# Patient Record
Sex: Female | Born: 1940
Health system: Southern US, Community
[De-identification: ages and names within clinical notes are randomized; demographics above are authoritative.]

## PROBLEM LIST (undated history)

## (undated) ENCOUNTER — Emergency Department (HOSPITAL_COMMUNITY): Admission: EM | Discharge status: Against Medical Advice | Payer: Medicare Other

## (undated) ENCOUNTER — Emergency Department (HOSPITAL_COMMUNITY)
Discharge status: Against Medical Advice | Payer: Medicare Other | Attending: Emergency Medicine | Admitting: Emergency Medicine

## (undated) DIAGNOSIS — Z95810 Presence of automatic (implantable) cardiac defibrillator: Secondary | ICD-10-CM

## (undated) DIAGNOSIS — M069 Rheumatoid arthritis, unspecified: Secondary | ICD-10-CM

## (undated) DIAGNOSIS — M109 Gout, unspecified: Secondary | ICD-10-CM

## (undated) DIAGNOSIS — D649 Anemia, unspecified: Secondary | ICD-10-CM

## (undated) DIAGNOSIS — I10 Essential (primary) hypertension: Secondary | ICD-10-CM

## (undated) DIAGNOSIS — R700 Elevated erythrocyte sedimentation rate: Secondary | ICD-10-CM

## (undated) DIAGNOSIS — N390 Urinary tract infection, site not specified: Secondary | ICD-10-CM

## (undated) DIAGNOSIS — H9190 Unspecified hearing loss, unspecified ear: Secondary | ICD-10-CM

## (undated) DIAGNOSIS — K219 Gastro-esophageal reflux disease without esophagitis: Secondary | ICD-10-CM

## (undated) DIAGNOSIS — J42 Unspecified chronic bronchitis: Secondary | ICD-10-CM

## (undated) DIAGNOSIS — I2510 Atherosclerotic heart disease of native coronary artery without angina pectoris: Secondary | ICD-10-CM

## (undated) DIAGNOSIS — N183 Chronic kidney disease, stage 3 (moderate): Principal | ICD-10-CM

## (undated) DIAGNOSIS — C50919 Malignant neoplasm of unspecified site of unspecified female breast: Secondary | ICD-10-CM

## (undated) DIAGNOSIS — K922 Gastrointestinal hemorrhage, unspecified: Secondary | ICD-10-CM

## (undated) DIAGNOSIS — I509 Heart failure, unspecified: Secondary | ICD-10-CM

## (undated) DIAGNOSIS — E7800 Pure hypercholesterolemia, unspecified: Secondary | ICD-10-CM

## (undated) DIAGNOSIS — D638 Anemia in other chronic diseases classified elsewhere: Secondary | ICD-10-CM

## (undated) DIAGNOSIS — E785 Hyperlipidemia, unspecified: Secondary | ICD-10-CM

## (undated) HISTORY — PX: CATARACT EXTRACTION W/ INTRAOCULAR LENS IMPLANT: SHX1309

## (undated) HISTORY — DX: Malignant neoplasm of unspecified site of unspecified female breast: C50.919

## (undated) HISTORY — DX: Essential (primary) hypertension: I10

## (undated) HISTORY — DX: Anemia in other chronic diseases classified elsewhere: D63.8

## (undated) HISTORY — DX: Hyperlipidemia, unspecified: E78.5

## (undated) HISTORY — DX: Gastrointestinal hemorrhage, unspecified: K92.2

## (undated) HISTORY — DX: Heart failure, unspecified: I50.9

## (undated) HISTORY — DX: Chronic kidney disease, stage 3 (moderate): N18.3

## (undated) HISTORY — DX: Unspecified chronic bronchitis: J42

## (undated) HISTORY — DX: Gout, unspecified: M10.9

## (undated) HISTORY — DX: Anemia, unspecified: D64.9

## (undated) HISTORY — DX: Pure hypercholesterolemia, unspecified: E78.00

## (undated) HISTORY — PX: TUBAL LIGATION: SHX77

## (undated) HISTORY — DX: Elevated erythrocyte sedimentation rate: R70.0

## (undated) HISTORY — PX: BREAST BIOPSY: SHX20

## (undated) HISTORY — DX: Rheumatoid arthritis, unspecified: M06.9

## (undated) HISTORY — DX: Gastro-esophageal reflux disease without esophagitis: K21.9

## (undated) HISTORY — PX: TOTAL KNEE ARTHROPLASTY: SHX125

## (undated) HISTORY — DX: Atherosclerotic heart disease of native coronary artery without angina pectoris: I25.10

---

## 1996-04-23 HISTORY — PX: MASTECTOMY: SHX3

## 2000-09-03 ENCOUNTER — Emergency Department (HOSPITAL_COMMUNITY): Admission: EM | Admit: 2000-09-03 | Discharge: 2000-09-03 | Payer: Self-pay | Admitting: Emergency Medicine

## 2000-09-03 ENCOUNTER — Encounter: Payer: Self-pay | Admitting: Emergency Medicine

## 2000-09-06 ENCOUNTER — Encounter: Payer: Self-pay | Admitting: Cardiology

## 2000-09-06 ENCOUNTER — Ambulatory Visit (HOSPITAL_COMMUNITY): Admission: RE | Admit: 2000-09-06 | Discharge: 2000-09-09 | Payer: Self-pay | Admitting: Cardiology

## 2000-12-19 ENCOUNTER — Ambulatory Visit (HOSPITAL_COMMUNITY): Admission: RE | Admit: 2000-12-19 | Discharge: 2000-12-19 | Payer: Self-pay | Admitting: Family Medicine

## 2000-12-19 ENCOUNTER — Encounter: Payer: Self-pay | Admitting: Family Medicine

## 2000-12-20 ENCOUNTER — Ambulatory Visit (HOSPITAL_COMMUNITY): Admission: RE | Admit: 2000-12-20 | Discharge: 2000-12-20 | Payer: Self-pay | Admitting: Family Medicine

## 2000-12-20 ENCOUNTER — Encounter: Payer: Self-pay | Admitting: Family Medicine

## 2001-04-01 ENCOUNTER — Other Ambulatory Visit: Admission: RE | Admit: 2001-04-01 | Discharge: 2001-04-01 | Payer: Self-pay | Admitting: Family Medicine

## 2001-04-25 ENCOUNTER — Encounter: Payer: Self-pay | Admitting: Internal Medicine

## 2001-04-25 ENCOUNTER — Emergency Department (HOSPITAL_COMMUNITY): Admission: EM | Admit: 2001-04-25 | Discharge: 2001-04-25 | Payer: Self-pay | Admitting: Internal Medicine

## 2001-05-20 ENCOUNTER — Encounter (HOSPITAL_COMMUNITY): Admission: RE | Admit: 2001-05-20 | Discharge: 2001-06-19 | Payer: Self-pay | Admitting: Oncology

## 2001-05-20 ENCOUNTER — Encounter: Admission: RE | Admit: 2001-05-20 | Discharge: 2001-05-20 | Payer: Self-pay | Admitting: Oncology

## 2001-12-30 ENCOUNTER — Encounter: Payer: Self-pay | Admitting: Family Medicine

## 2001-12-30 ENCOUNTER — Ambulatory Visit (HOSPITAL_COMMUNITY): Admission: RE | Admit: 2001-12-30 | Discharge: 2001-12-30 | Payer: Self-pay | Admitting: Family Medicine

## 2002-05-21 ENCOUNTER — Encounter: Admission: RE | Admit: 2002-05-21 | Discharge: 2002-05-21 | Payer: Self-pay | Admitting: Oncology

## 2002-05-21 ENCOUNTER — Encounter (HOSPITAL_COMMUNITY): Admission: RE | Admit: 2002-05-21 | Discharge: 2002-06-20 | Payer: Self-pay | Admitting: Oncology

## 2002-10-08 ENCOUNTER — Encounter: Payer: Self-pay | Admitting: Internal Medicine

## 2002-10-08 ENCOUNTER — Ambulatory Visit (HOSPITAL_COMMUNITY): Admission: RE | Admit: 2002-10-08 | Discharge: 2002-10-08 | Payer: Self-pay | Admitting: Internal Medicine

## 2003-01-05 ENCOUNTER — Ambulatory Visit (HOSPITAL_COMMUNITY): Admission: RE | Admit: 2003-01-05 | Discharge: 2003-01-05 | Payer: Self-pay | Admitting: Internal Medicine

## 2003-01-05 ENCOUNTER — Encounter: Payer: Self-pay | Admitting: Internal Medicine

## 2003-05-21 ENCOUNTER — Encounter: Admission: RE | Admit: 2003-05-21 | Discharge: 2003-05-21 | Payer: Self-pay | Admitting: Oncology

## 2003-05-21 ENCOUNTER — Encounter (HOSPITAL_COMMUNITY): Admission: RE | Admit: 2003-05-21 | Discharge: 2003-06-20 | Payer: Self-pay | Admitting: Oncology

## 2003-12-02 ENCOUNTER — Encounter: Payer: Self-pay | Admitting: Orthopedic Surgery

## 2004-01-06 ENCOUNTER — Ambulatory Visit (HOSPITAL_COMMUNITY): Admission: RE | Admit: 2004-01-06 | Discharge: 2004-01-06 | Payer: Self-pay | Admitting: Internal Medicine

## 2004-02-10 ENCOUNTER — Ambulatory Visit (HOSPITAL_COMMUNITY): Admission: RE | Admit: 2004-02-10 | Discharge: 2004-02-10 | Payer: Self-pay

## 2004-05-19 ENCOUNTER — Encounter: Admission: RE | Admit: 2004-05-19 | Discharge: 2004-05-19 | Payer: Self-pay | Admitting: Oncology

## 2004-05-19 ENCOUNTER — Encounter (HOSPITAL_COMMUNITY): Admission: RE | Admit: 2004-05-19 | Discharge: 2004-06-18 | Payer: Self-pay | Admitting: Oncology

## 2004-05-19 ENCOUNTER — Ambulatory Visit (HOSPITAL_COMMUNITY): Payer: Self-pay | Admitting: Oncology

## 2004-07-05 ENCOUNTER — Ambulatory Visit (HOSPITAL_COMMUNITY): Payer: Self-pay | Admitting: Oncology

## 2004-07-05 ENCOUNTER — Encounter (HOSPITAL_COMMUNITY): Admission: RE | Admit: 2004-07-05 | Discharge: 2004-08-04 | Payer: Self-pay | Admitting: Oncology

## 2004-07-05 ENCOUNTER — Encounter: Admission: RE | Admit: 2004-07-05 | Discharge: 2004-07-05 | Payer: Self-pay | Admitting: Oncology

## 2004-08-08 ENCOUNTER — Ambulatory Visit: Payer: Self-pay | Admitting: Orthopedic Surgery

## 2004-08-08 ENCOUNTER — Inpatient Hospital Stay (HOSPITAL_COMMUNITY): Admission: RE | Admit: 2004-08-08 | Discharge: 2004-08-12 | Payer: Self-pay | Admitting: Orthopedic Surgery

## 2004-08-17 ENCOUNTER — Ambulatory Visit: Payer: Self-pay | Admitting: Orthopedic Surgery

## 2004-08-21 ENCOUNTER — Ambulatory Visit: Payer: Self-pay | Admitting: Orthopedic Surgery

## 2004-09-04 ENCOUNTER — Ambulatory Visit: Payer: Self-pay | Admitting: Orthopedic Surgery

## 2004-10-30 ENCOUNTER — Ambulatory Visit: Payer: Self-pay | Admitting: Orthopedic Surgery

## 2005-01-08 ENCOUNTER — Encounter (HOSPITAL_COMMUNITY): Admission: RE | Admit: 2005-01-08 | Discharge: 2005-01-20 | Payer: Self-pay | Admitting: Oncology

## 2005-01-08 ENCOUNTER — Encounter: Admission: RE | Admit: 2005-01-08 | Discharge: 2005-01-20 | Payer: Self-pay | Admitting: Oncology

## 2005-02-05 ENCOUNTER — Ambulatory Visit: Payer: Self-pay | Admitting: Orthopedic Surgery

## 2005-05-14 ENCOUNTER — Ambulatory Visit (HOSPITAL_COMMUNITY): Admission: RE | Admit: 2005-05-14 | Discharge: 2005-05-14 | Payer: Self-pay | Admitting: Internal Medicine

## 2005-05-18 ENCOUNTER — Ambulatory Visit (HOSPITAL_COMMUNITY): Payer: Self-pay | Admitting: Oncology

## 2005-05-18 ENCOUNTER — Encounter (HOSPITAL_COMMUNITY): Admission: RE | Admit: 2005-05-18 | Discharge: 2005-06-17 | Payer: Self-pay | Admitting: Oncology

## 2005-05-18 ENCOUNTER — Encounter: Admission: RE | Admit: 2005-05-18 | Discharge: 2005-05-18 | Payer: Self-pay | Admitting: Oncology

## 2005-08-01 ENCOUNTER — Ambulatory Visit: Payer: Self-pay | Admitting: Orthopedic Surgery

## 2006-01-04 ENCOUNTER — Ambulatory Visit (HOSPITAL_COMMUNITY): Admission: RE | Admit: 2006-01-04 | Discharge: 2006-01-04 | Payer: Self-pay | Admitting: Internal Medicine

## 2006-01-09 ENCOUNTER — Ambulatory Visit (HOSPITAL_COMMUNITY): Admission: RE | Admit: 2006-01-09 | Discharge: 2006-01-09 | Payer: Self-pay | Admitting: Internal Medicine

## 2006-05-17 ENCOUNTER — Encounter (HOSPITAL_COMMUNITY): Admission: RE | Admit: 2006-05-17 | Discharge: 2006-06-16 | Payer: Self-pay | Admitting: Oncology

## 2006-05-17 ENCOUNTER — Ambulatory Visit (HOSPITAL_COMMUNITY): Payer: Self-pay | Admitting: Oncology

## 2006-08-12 ENCOUNTER — Ambulatory Visit: Payer: Self-pay | Admitting: Orthopedic Surgery

## 2007-01-30 ENCOUNTER — Ambulatory Visit (HOSPITAL_COMMUNITY): Admission: RE | Admit: 2007-01-30 | Discharge: 2007-01-30 | Payer: Self-pay | Admitting: Internal Medicine

## 2007-05-16 ENCOUNTER — Ambulatory Visit (HOSPITAL_COMMUNITY): Payer: Self-pay | Admitting: Oncology

## 2007-07-11 ENCOUNTER — Inpatient Hospital Stay (HOSPITAL_COMMUNITY): Admission: EM | Admit: 2007-07-11 | Discharge: 2007-07-17 | Payer: Self-pay | Admitting: Emergency Medicine

## 2007-07-11 ENCOUNTER — Ambulatory Visit: Payer: Self-pay | Admitting: Cardiology

## 2007-07-14 ENCOUNTER — Encounter (INDEPENDENT_AMBULATORY_CARE_PROVIDER_SITE_OTHER): Payer: Self-pay | Admitting: Internal Medicine

## 2007-08-11 ENCOUNTER — Ambulatory Visit: Payer: Self-pay | Admitting: Orthopedic Surgery

## 2007-08-11 DIAGNOSIS — M19041 Primary osteoarthritis, right hand: Secondary | ICD-10-CM | POA: Insufficient documentation

## 2007-08-11 DIAGNOSIS — Z96659 Presence of unspecified artificial knee joint: Secondary | ICD-10-CM | POA: Insufficient documentation

## 2007-08-11 DIAGNOSIS — M19042 Primary osteoarthritis, left hand: Secondary | ICD-10-CM | POA: Insufficient documentation

## 2007-08-15 ENCOUNTER — Ambulatory Visit: Payer: Self-pay | Admitting: Internal Medicine

## 2007-10-16 ENCOUNTER — Ambulatory Visit: Payer: Self-pay | Admitting: Internal Medicine

## 2008-01-19 ENCOUNTER — Ambulatory Visit (HOSPITAL_COMMUNITY): Admission: RE | Admit: 2008-01-19 | Discharge: 2008-01-19 | Payer: Self-pay | Admitting: Internal Medicine

## 2008-01-19 ENCOUNTER — Encounter: Payer: Self-pay | Admitting: Internal Medicine

## 2008-01-19 ENCOUNTER — Ambulatory Visit: Payer: Self-pay | Admitting: Cardiology

## 2008-01-21 ENCOUNTER — Ambulatory Visit: Payer: Self-pay | Admitting: Cardiology

## 2008-02-03 ENCOUNTER — Ambulatory Visit (HOSPITAL_COMMUNITY): Admission: RE | Admit: 2008-02-03 | Discharge: 2008-02-03 | Payer: Self-pay | Admitting: Internal Medicine

## 2008-04-08 ENCOUNTER — Ambulatory Visit: Payer: Self-pay | Admitting: Cardiology

## 2008-04-12 ENCOUNTER — Ambulatory Visit (HOSPITAL_COMMUNITY): Admission: RE | Admit: 2008-04-12 | Discharge: 2008-04-12 | Payer: Self-pay | Admitting: Cardiology

## 2008-04-14 ENCOUNTER — Ambulatory Visit: Payer: Self-pay | Admitting: Cardiology

## 2008-05-05 ENCOUNTER — Ambulatory Visit: Payer: Self-pay | Admitting: Cardiology

## 2008-05-24 ENCOUNTER — Ambulatory Visit (HOSPITAL_COMMUNITY): Payer: Self-pay | Admitting: Oncology

## 2008-05-24 ENCOUNTER — Encounter (HOSPITAL_COMMUNITY): Admission: RE | Admit: 2008-05-24 | Discharge: 2008-06-23 | Payer: Self-pay | Admitting: Oncology

## 2008-05-27 ENCOUNTER — Ambulatory Visit: Payer: Self-pay | Admitting: Cardiology

## 2008-09-27 ENCOUNTER — Ambulatory Visit: Payer: Self-pay | Admitting: Infectious Diseases

## 2008-09-27 ENCOUNTER — Observation Stay (HOSPITAL_COMMUNITY): Admission: EM | Admit: 2008-09-27 | Discharge: 2008-09-28 | Payer: Self-pay | Admitting: Emergency Medicine

## 2008-09-28 ENCOUNTER — Encounter: Payer: Self-pay | Admitting: Infectious Diseases

## 2009-01-18 ENCOUNTER — Ambulatory Visit (HOSPITAL_COMMUNITY): Admission: RE | Admit: 2009-01-18 | Discharge: 2009-01-18 | Payer: Self-pay | Admitting: Cardiology

## 2009-01-18 ENCOUNTER — Encounter (INDEPENDENT_AMBULATORY_CARE_PROVIDER_SITE_OTHER): Payer: Self-pay | Admitting: *Deleted

## 2009-01-18 ENCOUNTER — Encounter: Payer: Self-pay | Admitting: Cardiology

## 2009-01-18 ENCOUNTER — Ambulatory Visit: Payer: Self-pay | Admitting: Cardiology

## 2009-01-20 ENCOUNTER — Encounter (INDEPENDENT_AMBULATORY_CARE_PROVIDER_SITE_OTHER): Payer: Self-pay | Admitting: *Deleted

## 2009-01-21 ENCOUNTER — Encounter: Payer: Self-pay | Admitting: Cardiology

## 2009-02-14 ENCOUNTER — Ambulatory Visit (HOSPITAL_COMMUNITY): Admission: RE | Admit: 2009-02-14 | Discharge: 2009-02-14 | Payer: Self-pay | Admitting: Internal Medicine

## 2009-02-28 ENCOUNTER — Encounter: Payer: Self-pay | Admitting: Cardiology

## 2009-02-28 ENCOUNTER — Encounter (INDEPENDENT_AMBULATORY_CARE_PROVIDER_SITE_OTHER): Payer: Self-pay | Admitting: *Deleted

## 2009-02-28 LAB — CONVERTED CEMR LAB
ALT: 11 units/L
ALT: 11 units/L (ref 0–35)
AST: 15 units/L
AST: 15 units/L (ref 0–37)
Albumin: 3.8 g/dL (ref 3.5–5.2)
Alkaline Phosphatase: 68 units/L
Alkaline Phosphatase: 68 units/L (ref 39–117)
Bilirubin, Direct: 0.1 mg/dL
Bilirubin, Direct: 0.1 mg/dL (ref 0.0–0.3)
Cholesterol: 140 mg/dL (ref 0–200)
HDL: 37 mg/dL — ABNORMAL LOW (ref >39)
Indirect Bilirubin: 0.2 mg/dL (ref 0.0–0.9)
LDL Cholesterol: 86 mg/dL
LDL Cholesterol: 86 mg/dL (ref 0–99)
Total Bilirubin: 0.3 mg/dL (ref 0.3–1.2)
Total CHOL/HDL Ratio: 3.8
Total Protein: 7.6 g/dL (ref 6.0–8.3)
Triglycerides: 85 mg/dL (ref <150)
VLDL: 17 mg/dL (ref 0–40)

## 2009-03-07 ENCOUNTER — Encounter: Payer: Self-pay | Admitting: Cardiology

## 2009-05-18 ENCOUNTER — Encounter (INDEPENDENT_AMBULATORY_CARE_PROVIDER_SITE_OTHER): Payer: Self-pay | Admitting: *Deleted

## 2009-05-23 ENCOUNTER — Encounter: Payer: Self-pay | Admitting: Cardiology

## 2009-05-23 ENCOUNTER — Ambulatory Visit (HOSPITAL_COMMUNITY): Payer: Self-pay | Admitting: Oncology

## 2009-05-24 ENCOUNTER — Ambulatory Visit: Payer: Self-pay | Admitting: Cardiology

## 2009-05-24 DIAGNOSIS — I5022 Chronic systolic (congestive) heart failure: Secondary | ICD-10-CM | POA: Insufficient documentation

## 2009-05-24 DIAGNOSIS — E785 Hyperlipidemia, unspecified: Secondary | ICD-10-CM | POA: Insufficient documentation

## 2009-08-12 ENCOUNTER — Emergency Department (HOSPITAL_COMMUNITY): Admission: EM | Admit: 2009-08-12 | Discharge: 2009-08-12 | Payer: Self-pay | Admitting: Emergency Medicine

## 2009-08-15 ENCOUNTER — Emergency Department (HOSPITAL_COMMUNITY): Admission: EM | Admit: 2009-08-15 | Discharge: 2009-08-15 | Payer: Self-pay | Admitting: Emergency Medicine

## 2009-08-16 ENCOUNTER — Emergency Department (HOSPITAL_COMMUNITY): Admission: EM | Admit: 2009-08-16 | Discharge: 2009-08-16 | Payer: Self-pay | Admitting: Emergency Medicine

## 2009-09-07 ENCOUNTER — Ambulatory Visit: Payer: Self-pay | Admitting: Orthopedic Surgery

## 2009-11-21 ENCOUNTER — Encounter (INDEPENDENT_AMBULATORY_CARE_PROVIDER_SITE_OTHER): Payer: Self-pay | Admitting: *Deleted

## 2009-11-21 LAB — CONVERTED CEMR LAB
Albumin: 3.6 g/dL
Alkaline Phosphatase: 78 units/L
Bilirubin, Direct: 0.2 mg/dL
HDL: 39 mg/dL
Triglycerides: 64 mg/dL

## 2009-11-22 ENCOUNTER — Encounter: Payer: Self-pay | Admitting: Cardiology

## 2009-11-22 ENCOUNTER — Encounter (INDEPENDENT_AMBULATORY_CARE_PROVIDER_SITE_OTHER): Payer: Self-pay | Admitting: *Deleted

## 2009-11-22 LAB — CONVERTED CEMR LAB
Bilirubin, Direct: 0.2 mg/dL (ref 0.0–0.3)
Indirect Bilirubin: 0.1 mg/dL (ref 0.0–0.9)
LDL Cholesterol: 67 mg/dL (ref 0–99)
Total Protein: 8 g/dL (ref 6.0–8.3)
VLDL: 13 mg/dL (ref 0–40)

## 2009-12-14 ENCOUNTER — Ambulatory Visit: Payer: Self-pay | Admitting: Cardiology

## 2009-12-14 DIAGNOSIS — R12 Heartburn: Secondary | ICD-10-CM | POA: Insufficient documentation

## 2010-02-16 ENCOUNTER — Ambulatory Visit (HOSPITAL_COMMUNITY): Admission: RE | Admit: 2010-02-16 | Discharge: 2010-02-16 | Payer: Self-pay | Admitting: Internal Medicine

## 2010-03-28 ENCOUNTER — Ambulatory Visit (HOSPITAL_COMMUNITY)
Admission: RE | Admit: 2010-03-28 | Discharge: 2010-03-28 | Payer: Self-pay | Source: Home / Self Care | Admitting: Internal Medicine

## 2010-04-10 ENCOUNTER — Ambulatory Visit (HOSPITAL_COMMUNITY)
Admission: RE | Admit: 2010-04-10 | Discharge: 2010-04-10 | Payer: Self-pay | Source: Home / Self Care | Attending: Internal Medicine | Admitting: Internal Medicine

## 2010-05-14 ENCOUNTER — Encounter: Payer: Self-pay | Admitting: Internal Medicine

## 2010-05-14 ENCOUNTER — Encounter (INDEPENDENT_AMBULATORY_CARE_PROVIDER_SITE_OTHER): Payer: Self-pay | Admitting: Internal Medicine

## 2010-05-19 ENCOUNTER — Encounter (HOSPITAL_COMMUNITY)
Admission: RE | Admit: 2010-05-19 | Discharge: 2010-05-23 | Payer: Self-pay | Source: Home / Self Care | Attending: Oncology | Admitting: Oncology

## 2010-05-23 ENCOUNTER — Encounter: Payer: Self-pay | Admitting: Cardiology

## 2010-05-23 ENCOUNTER — Ambulatory Visit (HOSPITAL_COMMUNITY)
Admission: RE | Admit: 2010-05-23 | Discharge: 2010-05-23 | Payer: Self-pay | Source: Home / Self Care | Attending: Oncology | Admitting: Oncology

## 2010-05-23 LAB — DIFFERENTIAL
Basophils Relative: 1 % (ref 0–1)
Eosinophils Absolute: 0.1 10*3/uL (ref 0.0–0.7)
Neutrophils Relative %: 61 % (ref 43–77)

## 2010-05-23 LAB — COMPREHENSIVE METABOLIC PANEL
ALT: 12 U/L (ref 0–35)
Alkaline Phosphatase: 79 U/L (ref 39–117)
CO2: 29 mEq/L (ref 19–32)
GFR calc non Af Amer: >60 mL/min (ref >60)
Glucose, Bld: 115 mg/dL — ABNORMAL HIGH (ref 70–99)
Potassium: 3.4 mEq/L — ABNORMAL LOW (ref 3.5–5.1)
Sodium: 143 mEq/L (ref 135–145)
Total Bilirubin: 0.4 mg/dL (ref 0.3–1.2)

## 2010-05-23 LAB — CBC
MCH: 26.5 pg (ref 26.0–34.0)
Platelets: 270 10*3/uL (ref 150–400)
RBC: 4.38 MIL/uL (ref 3.87–5.11)
WBC: 6.8 10*3/uL (ref 4.0–10.5)

## 2010-05-23 NOTE — Letter (Signed)
Summary: [facility], general practice at [facility]. [address], [place] [address]   Phone: [phone]  Fax: [phone]      November 22, 2009 MRN: [id number]   [name] [place], [place]  [address]   Dear Ms. Profit,  Your test ordered by Rande Lawman has been reviewed by your physician (or physician assistant) and was found to be normal or stable. Your physician (or physician assistant) felt no changes were needed at this time.  ____ Echocardiogram  ____ Cardiac Stress Test  __X__ Lab Work  ____ Peripheral vascular study of arms, legs or neck  ____ CT scan or X-ray  ____ Lung or Breathing test  ____ Other: Please continue on current medical treatment.  Thank you.   Rozann Lesches, MD, F.A.C.C

## 2010-05-23 NOTE — Letter (Signed)
Summary: [facility]   Imported By: Sallee Provencal 06/21/2009 15:37:29  _____________________________________________________________________  External Attachment:    Type:   Image     Comment:   External Document

## 2010-05-23 NOTE — Letter (Signed)
Summary: [facility], general practice at [facility]. [address] [place] [address]   Phone: [phone]  Fax: [phone]     May 24, 2009 MRN: [id number]   GENTRY MLAKAR [place], [place]  [address]      YOUR LAB WORK IS DUE  November 21, 2009 _________________________________________  Please go to [facility], located across the street from [facility] on the second floor.  Hours are Monday - Friday 7am until 7:30pm         Saturday 8am until 12noon    _X_  DO NOT EAT OR DRINK AFTER MIDNIGHT EVENING PRIOR TO LABWORK  __ YOUR LABWORK IS NOT FASTING --YOU MAY EAT PRIOR TO LABWORK

## 2010-05-23 NOTE — Assessment & Plan Note (Signed)
Summary: 2 yr xr rt tka/medicare/medicaid/bsf   Visit Type:  Follow-up Primary Provider:  Dr. [name]  CC:  right knee.  History of Present Illness: 70 years old status post RIGHT total knee arthroplasty 3 years ago as her 3 year followup   Xrays today.  DOS 08-09-06. Right total knee replacement  She has some stiffness in the morning and swelling when the weather changes but no other symptoms  Review of systems as far as the musculoskeletal system normal    Allergies: No Known Drug Allergies  Physical Exam  Additional Exam:  range of motion in the RIGHT knee is 100 pulses good skin is intact sensations normal knee is stable strength is normal.  She ambulates without a cane   Impression & Recommendations:  Problem # 1:  TOTAL KNEE FOLLOW-UP (ICD-V43.65) Assessment Comment Only  x-ray The  alignment was normal, PTF reduced without tilt or subluxation, no evidence of loosening.  IMPRESSION: normal appearance of the implant   Orders: Est. Patient Level III [id number]) Knee x-ray,  3 views [id number])  Patient Instructions: 1)  Please schedule a follow-up appointment in 1 year. 2)  annual TKA film

## 2010-05-23 NOTE — Miscellaneous (Signed)
Summary: labs lipids,liver,11/21/2009  Clinical Lists Changes  Observations: Added new observation of ALBUMIN: 3.6 g/dL (11/21/2009 8:21) Added new observation of PROTEIN, TOT: 8.0 g/dL (11/21/2009 8:21) Added new observation of SGPT (ALT): 9 units/L (11/21/2009 8:21) Added new observation of SGOT (AST): 16 units/L (11/21/2009 8:21) Added new observation of ALK PHOS: 78 units/L (11/21/2009 8:21) Added new observation of BILI DIRECT: 0.2 mg/dL (11/21/2009 8:21) Added new observation of LDL: 67 mg/dL (11/21/2009 8:21) Added new observation of HDL: 39 mg/dL (11/21/2009 8:21) Added new observation of TRIGLYC TOT: 64 mg/dL (11/21/2009 8:21) Added new observation of CHOLESTEROL: 119 mg/dL (11/21/2009 8:21)

## 2010-05-23 NOTE — Assessment & Plan Note (Signed)
Summary: 6 mth f/u per checkout on 05/24/09/tg      Allergies Added: NKDA  Visit Type:  Follow-up Primary Taraoluwa Thakur:  Dr. [name]   History of Present Illness: 70 year old woman presents for followup. I saw her back in February of this year. She reports no progressive shortness of breath or anginal chest pain. Main symptoms are a description of heartburn, some difficulty swallowing foods, as if things get "stuck." She is not on a regular antacid. She is due to see Dr. Legrand Rams for a physical soon.  Followup labs from August of this year show cholesterol 119, triglycerides 64, HDL 39, LDL 67, AST 16, ALT 9. We discussed these. She is tolerating lovastatin.  We talked about her medication compliance, which seems better, although she does forget to take her twice daily medicines "from time to time."   Current Medications (verified): 1)  Alprazolam .25mg  .... Take 1 Tablet By Mouth Two Times A Day  As Needed 2)  Furosemide 40 Mg Tabs (Furosemide) .... Take 1 Tablet By Mouth Two Times A Day 3)  Aspirin 81 Mg Tbec (Aspirin) .... Take One Tablet By Mouth Daily 4)  Lovastatin 20 Mg Tabs (Lovastatin) .... Take 1 Tablet By Mouth Once A Day 5)  Lisinopril 40 Mg Tabs (Lisinopril) .... Take One Tablet By Mouth Daily 6)  Carvedilol 25 Mg Tabs (Carvedilol) .... Take One Tablet By Mouth Twice A Day  Allergies (verified): No Known Drug Allergies  Past History:  Past Medical History: Last updated: 05/23/2009 CAD - reported remote PTCA Ischemic cardiomyopathy, LVEF 40-45% Anemia Hyperlipidemia Breast cancer Arthritis  Social History: Last updated: 05/23/2009 Retired  Married  Tobacco Use - Former.  Alcohol Use - no  Review of Systems       The patient complains of dyspnea on exertion.  The patient denies anorexia, fever, chest pain, syncope, peripheral edema, hemoptysis, melena, and hematochezia.         Otherwise reviewed and negative.  Vital Signs:  Patient profile:   70 year  old female Weight:      160 pounds Pulse rate:   76 / minute BP sitting:   151 / 75  (right arm)  Vitals Entered By: Doretha Sou, CNA (December 14, 2009 9:22 AM)  Physical Exam  Additional Exam:  Overweight woman in no acute distress. HEENT: Conjunctiva and lids normal, oropharynx with poor dentition. Neck: Supple, no elevated JVP. Lungs: Clear to auscultation, nonlabored. Cardiac: Regular rate and rhythm, soft systolic murmur, indistinct PMI, split S2. Abdomen: Soft, nontender. Skin: Warm and dry. Extremities:: No pitting edema.   Nuclear Study  Procedure date:  07/16/2007  Findings:      IMPRESSION:   Adenosine Myoview electrically nondiagnostic for ischemia.  Myoview   scan with evidence of scar in the septal, inferior, and apical   distributions.  No evidence for inducible ischemia.  LVF on gating   calculated at 37%.  Echocardiogram  Procedure date:  01/18/2009  Findings:      Study Conclusions    1. Left ventricle: The cavity size was mildly dilated. Wall      thickness was increased in a pattern of mild LVH. Systolic      function was mildly to moderately reduced. The estimated ejection      fraction was in the range of 40% to 45%. Hypokinesis of the      anteroseptal, septal and inferoseptal myocardium with paradoxic      septal motion. Contractility of these segments appears  substantially reduced.   2. Mitral valve: Mild regurgitation.   3. Left atrium: The atrium was mildly to moderately dilated.   4. Right atrium: The atrium was mildly to moderately dilated.   5. Pericardium, extracardiac: A very small, free-flowing pericardial      effusion was identified.   Impressions:    - Compared to the previous study of 123XX123, LV systolic function     modestly improved; no AS noted on current study; no ASD noted at     present.  Impression & Recommendations:  Problem # 1:  CORONARY ATHEROSCLEROSIS NATIVE CORONARY ARTERY (ICD-414.01)  No active angina.  Continue medical therapy at this point. Plan to see her back over the next 6 months.  Her updated medication list for this problem includes:    Aspirin 81 Mg Tbec (Aspirin) .[name]... Take one tablet by mouth daily    Lisinopril 40 Mg Tabs (Lisinopril) .[name]... Take one tablet by mouth daily    Carvedilol 25 Mg Tabs (Carvedilol) .[name]... Take one tablet by mouth twice a day  Problem # 2:  HYPERLIPIDEMIA (ICD-272.4)  LDL at goal. Plan followup lipid profile and liver function tests prior to her next visit.  Her updated medication list for this problem includes:    Lovastatin 20 Mg Tabs (Lovastatin) .[name]... Take 1 tablet by mouth once a day  Future Orders: T-Lipid Profile [id number]) ... 06/05/2010 T-Hepatic Function [phone]) ... 06/05/2010  Problem # 3:  CHRONIC SYSTOLIC HEART FAILURE (123456)  Symptomatically stable with NYHA class II dyspnea on exertion. Appears euvolemic on examination. LVEF 40-45%.  Her updated medication list for this problem includes:    Furosemide 40 Mg Tabs (Furosemide) .[name]... Take 1 tablet by mouth two times a day    Aspirin 81 Mg Tbec (Aspirin) .[name]... Take one tablet by mouth daily    Lisinopril 40 Mg Tabs (Lisinopril) .[name]... Take one tablet by mouth daily    Carvedilol 25 Mg Tabs (Carvedilol) .[name]... Take one tablet by mouth twice a day  Problem # 4:  HEARTBURN (ICD-787.1)  Patient describing reflux symptoms, and also difficulty swallowing. I asked her to address this with Dr. Legrand Rams. She may respond to proton pump inhibitor, and if symptoms persist, gastroenterology referral could be considered.  Patient Instructions: 1)  Your physician recommends that you schedule a follow-up appointment in: 6 months 2)  Your physician recommends that you return for lab work in: 6 months, just before next office visit. 3)  Your physician recommends that you continue on your current medications as directed. Please refer to the Current Medication list given to you today.

## 2010-05-23 NOTE — Assessment & Plan Note (Signed)
Summary: PAST DUE FOR F/U PER PT PHONE CALL/TG  Medications Added * ALPRAZOLAM .25MG  Take 1 tablet by mouth two times a day  as needed ASPIRIN 81 MG TBEC (ASPIRIN) Take one tablet by mouth daily LOVASTATIN 20 MG TABS (LOVASTATIN) Take 1 tablet by mouth once a day LISINOPRIL 40 MG TABS (LISINOPRIL) Take one tablet by mouth daily CARVEDILOL 25 MG TABS (CARVEDILOL) Take one tablet by mouth twice a day      Allergies Added: NKDA  Visit Type:  Follow-up Primary Provider:  Dr. [name]   History of Present Illness: 70 year old woman presents for followup visit. She indicates no significant angina, and NYHA class II dyspnea on exertion. She has not been taking her regular medical therapy consistently. We discussed this today. I note that she was hospitalized in June of last year with congestive heart failure exacerbation in the setting of acute bronchitis.   She states that she has been trying to watch her diet and lose some weight.  Labs from November 8 revealed ALT 11, AST 15, LDL 86, HDL 37, triglycerides 85, total cholesterol 140.  Last ischemic evaluation was via an adenosine Myoview in March 2009, demonstrating scar in the septal, inferior, and apical distributions, without ischemia.  Clinical Review Panels:  Echocardiogram Echocardiogram Study Conclusions    1. Left ventricle: The cavity size was mildly dilated. Wall      thickness was increased in a pattern of mild LVH. Systolic      function was mildly to moderately reduced. The estimated ejection      fraction was in the range of 40% to 45%. Hypokinesis of the      anteroseptal, septal and inferoseptal myocardium with paradoxic      septal motion. Contractility of these segments appears      substantially reduced.   2. Mitral valve: Mild regurgitation.   3. Left atrium: The atrium was mildly to moderately dilated.   4. Right atrium: The atrium was mildly to moderately dilated.   5. Pericardium, extracardiac: A very  small, free-flowing pericardial      effusion was identified.   Impressions:    - Compared to the previous study of 123XX123, LV systolic function     modestly improved; no AS noted on current study; no ASD noted at     present. (01/18/2009)    Current Medications (verified): 1)  Alprazolam .25mg  .... Take 1 Tablet By Mouth Two Times A Day  As Needed 2)  Neurontin100 Mg .... Hs 3)  Furosemide 40 Mg Tabs (Furosemide) .... Take 1 Tablet By Mouth Two Times A Day 4)  Aspirin 81 Mg Tbec (Aspirin) .... Take One Tablet By Mouth Daily 5)  Lovastatin 20 Mg Tabs (Lovastatin) .... Take 1 Tablet By Mouth Once A Day 6)  Lisinopril 40 Mg Tabs (Lisinopril) .... Take One Tablet By Mouth Daily 7)  Carvedilol 25 Mg Tabs (Carvedilol) .... Take One Tablet By Mouth Twice A Day  Allergies (verified): No Known Drug Allergies  Past History:  Past Medical History: Last updated: 05/23/2009 CAD - reported remote PTCA Ischemic cardiomyopathy, LVEF 40-45% Anemia Hyperlipidemia Breast cancer Arthritis  Social History: Last updated: 05/23/2009 Retired  Married  Tobacco Use - Former.  Alcohol Use - no  Review of Systems  The patient denies anorexia, fever, chest pain, syncope, peripheral edema, prolonged cough, hemoptysis, abdominal pain, melena, and hematochezia.         Otherwise reviewed and negative.  Vital Signs:  Patient profile:  70 year old female Height:      64 inches Weight:      158 pounds BMI:     27.22 O2 Sat:      98 % on Room air Temp:     97.6 degrees F oral Pulse rate:   75 / minute BP sitting:   147 / 70  (right arm)  Vitals Entered ByJeani Hawking Via LPN (February  1, 624THL 1:33 PM)  Nutrition Counseling: Patient's BMI is greater than 25 and therefore counseled on weight management options.  O2 Flow:  Room air  Physical Exam  Additional Exam:  Overweight female in no acute distress. HEENT: Conjunctiva and lids normal, oropharynx with moist mucosa. Neck: Supple, no  elevated jugular venous pressure or carotid bruits. Cardiac: Regular rate and rhythm, split S2, no S3 gallop. Abdomen: Soft, nontender, bowel sounds present. Extremity: Trace ankle edema, nonpitting. Distal pulses one plus. Skin: Warm and dry. Musculoskeletal: No gross deformities. Neuropsychiatric: Alert and oriented x3, affect appropriate.   EKG  Procedure date:  05/24/2009  Findings:      Sinus rhythm versus ectopic atrial rhythm at 71 beats per minute with left bundle branch block pattern (old).  Impression & Recommendations:  Problem # 1:  CORONARY ATHEROSCLEROSIS NATIVE CORONARY ARTERY (ICD-414.01)  Symptomatically stable on medical therapy, despite admitted noncompliance with regular medication use. I discussed with Ms. Poch being more consistent with her medications. She is not describing any progressive angina. Ischemic evaluation within the last 2 years was reassuring. A 6 month followup visit will be planned.  Her updated medication list for this problem includes:    Aspirin 81 Mg Tbec (Aspirin) .[name]... Take one tablet by mouth daily    Lisinopril 40 Mg Tabs (Lisinopril) .[name]... Take one tablet by mouth daily    Carvedilol 25 Mg Tabs (Carvedilol) .[name]... Take one tablet by mouth twice a day  Problem # 2:  HYPERLIPIDEMIA (ICD-272.4)  LDL fairly well controlled. Continue present medications. Followup lipid profile and liver function tests prior to next visit.  Her updated medication list for this problem includes:    Lovastatin 20 Mg Tabs (Lovastatin) .[name]... Take 1 tablet by mouth once a day  Future Orders: T-Lipid Profile [id number]) ... 11/21/2009 T-Hepatic Function [phone]) ... 11/21/2009  Problem # 3:  CHRONIC SYSTOLIC HEART FAILURE (123456)  NYHA class II dyspnea on exertion and no significant lower extremity edema. LVEF most recently documented in the range of 40-45%. Refill provided for [facility].  Her updated medication list for this problem includes:     Furosemide 40 Mg Tabs (Furosemide) .[name]... Take 1 tablet by mouth two times a day    Aspirin 81 Mg Tbec (Aspirin) .[name]... Take one tablet by mouth daily    Lisinopril 40 Mg Tabs (Lisinopril) .[name]... Take one tablet by mouth daily    Carvedilol 25 Mg Tabs (Carvedilol) .[name]... Take one tablet by mouth twice a day  Patient Instructions: 1)  Your physician recommends that you schedule a follow-up appointment in: 6 months 2)  Your physician recommends that you return for lab work in: 6 months, before next office visit. 3)  Your physician recommends that you continue on your current medications as directed. Please refer to the Current Medication list given to you today. Prescriptions: CARVEDILOL 25 MG TABS (CARVEDILOL) Take one tablet by mouth twice a day  #60 x 6   Entered by:   Jeani Hawking Via LPN   Authorized by:   Beckie Salts, MD, [facility]  Signed by:   Jeani Hawking Via LPN on [id number]   Method used:   Electronically to        [facility]. [phone]* (retail)       [address]       [place], [place]  [address]       Ph: [phone] or [phone]       Fax: [phone]   RxID:   [id number]

## 2010-05-23 NOTE — Miscellaneous (Signed)
Summary: labs lipid,liver , 02/28/2009  Clinical Lists Changes  Observations: Added new observation of ALBUMIN: 3.8 g/dL (02/28/2009 8:35) Added new observation of PROTEIN, TOT: 7.6 g/dL (02/28/2009 8:35) Added new observation of SGPT (ALT): 11 units/L (02/28/2009 8:35) Added new observation of SGOT (AST): 15 units/L (02/28/2009 8:35) Added new observation of ALK PHOS: 68 units/L (02/28/2009 8:35) Added new observation of BILI DIRECT: 0.1 mg/dL (02/28/2009 8:35) Added new observation of LDL: 86 mg/dL (02/28/2009 8:35) Added new observation of HDL: 37 mg/dL (02/28/2009 8:35) Added new observation of TRIGLYC TOT: 85 mg/dL (02/28/2009 8:35) Added new observation of CHOLESTEROL: 140 mg/dL (02/28/2009 8:35)

## 2010-05-23 NOTE — Letter (Signed)
Summary: [facility], general practice at [facility]. [address], [place] [address]   Phone: [phone]  Fax: [phone]     December 14, 2009 MRN: [id number]   ALIANAH SCHELLIN [place], [place]  [address]      YOUR LAB WORK IS DUE  June 05, 2010 _________________________________________  Please go to [facility], located across the street from [facility] on the second floor.  Hours are Monday - Friday 7am until 7:30pm         Saturday 8am until 12noon    _X_  DO NOT EAT OR DRINK AFTER MIDNIGHT EVENING PRIOR TO LABWORK  __ YOUR LABWORK IS NOT FASTING --YOU MAY EAT PRIOR TO LABWORK

## 2010-06-06 ENCOUNTER — Encounter: Payer: Self-pay | Admitting: Cardiology

## 2010-06-06 LAB — CONVERTED CEMR LAB
ALT: 10 units/L (ref 0–35)
AST: 18 units/L (ref 0–37)
Bilirubin, Direct: 0.1 mg/dL (ref 0.0–0.3)
Cholesterol: 129 mg/dL (ref 0–200)
HDL: 40 mg/dL (ref >39)
Total CHOL/HDL Ratio: 3.2

## 2010-06-14 NOTE — Letter (Signed)
Summary: [facility], general practice at [facility]. [address] [place] [address]   Phone: [phone]  Fax: [phone]      June 06, 2010 MRN: [id number]   [name] [place], [place]  [address]   Dear [name],  Your test ordered by Rande Lawman has been reviewed by your physician (or physician assistant) and was found to be normal or stable. Your physician (or physician assistant) felt no changes were needed at this time.  ____ Echocardiogram  ____ Cardiac Stress Test  __X__ Lab Work  ____ Peripheral vascular study of arms, legs or neck  ____ CT scan or X-ray  ____ Lung or Breathing test  ____ Other:  Please continue on current medical treatment.  Thank you.   Rozann Lesches, MD, F.A.C.C

## 2010-06-20 NOTE — Letter (Signed)
Summary: [facility] Office Progress Note   [facility] Office Progress Note   Imported By: Sallee Provencal 06/14/2010 12:21:08  _____________________________________________________________________  External Attachment:    Type:   Image     Comment:   External Document

## 2010-07-04 ENCOUNTER — Other Ambulatory Visit (HOSPITAL_COMMUNITY): Payer: Self-pay | Admitting: Internal Medicine

## 2010-07-04 ENCOUNTER — Ambulatory Visit (HOSPITAL_COMMUNITY)
Admission: RE | Admit: 2010-07-04 | Discharge: 2010-07-04 | Disposition: A | Discharge status: Home / Self Care | Payer: MEDICARE | Source: Ambulatory Visit | Attending: Internal Medicine | Admitting: Internal Medicine

## 2010-07-04 DIAGNOSIS — R05 Cough: Secondary | ICD-10-CM

## 2010-07-04 DIAGNOSIS — J90 Pleural effusion, not elsewhere classified: Secondary | ICD-10-CM | POA: Insufficient documentation

## 2010-07-04 DIAGNOSIS — R059 Cough, unspecified: Secondary | ICD-10-CM

## 2010-07-11 LAB — CULTURE, ROUTINE-ABSCESS

## 2010-07-19 ENCOUNTER — Inpatient Hospital Stay (HOSPITAL_COMMUNITY)
Admission: EM | Admit: 2010-07-19 | Discharge: 2010-07-24 | DRG: 193 | Disposition: A | Discharge status: Home / Self Care | Payer: Medicare Other | Attending: Internal Medicine | Admitting: Internal Medicine

## 2010-07-19 ENCOUNTER — Emergency Department (HOSPITAL_COMMUNITY): Payer: Medicare Other

## 2010-07-19 DIAGNOSIS — J189 Pneumonia, unspecified organism: Principal | ICD-10-CM | POA: Diagnosis present

## 2010-07-19 DIAGNOSIS — E785 Hyperlipidemia, unspecified: Secondary | ICD-10-CM | POA: Diagnosis present

## 2010-07-19 DIAGNOSIS — D649 Anemia, unspecified: Secondary | ICD-10-CM | POA: Diagnosis present

## 2010-07-19 DIAGNOSIS — I509 Heart failure, unspecified: Secondary | ICD-10-CM | POA: Diagnosis present

## 2010-07-19 DIAGNOSIS — I5023 Acute on chronic systolic (congestive) heart failure: Secondary | ICD-10-CM | POA: Diagnosis present

## 2010-07-19 DIAGNOSIS — I10 Essential (primary) hypertension: Secondary | ICD-10-CM | POA: Diagnosis present

## 2010-07-19 DIAGNOSIS — I428 Other cardiomyopathies: Secondary | ICD-10-CM | POA: Diagnosis present

## 2010-07-19 DIAGNOSIS — E876 Hypokalemia: Secondary | ICD-10-CM | POA: Diagnosis present

## 2010-07-19 DIAGNOSIS — Z853 Personal history of malignant neoplasm of breast: Secondary | ICD-10-CM

## 2010-07-19 DIAGNOSIS — I2510 Atherosclerotic heart disease of native coronary artery without angina pectoris: Secondary | ICD-10-CM | POA: Diagnosis present

## 2010-07-19 LAB — URINALYSIS, ROUTINE W REFLEX MICROSCOPIC
Bilirubin Urine: NEGATIVE
Ketones, ur: NEGATIVE mg/dL
Leukocytes, UA: NEGATIVE
Nitrite: NEGATIVE
Protein, ur: 30 mg/dL — AB

## 2010-07-19 LAB — BASIC METABOLIC PANEL
Calcium: 9 mg/dL (ref 8.4–10.5)
Creatinine, Ser: 0.85 mg/dL (ref 0.4–1.2)
GFR calc Af Amer: >60 mL/min (ref >60)
GFR calc non Af Amer: >60 mL/min (ref >60)
Sodium: 134 mEq/L — ABNORMAL LOW (ref 135–145)

## 2010-07-19 LAB — CBC
HCT: 33.5 % — ABNORMAL LOW (ref 36.0–46.0)
Hemoglobin: 10.7 g/dL — ABNORMAL LOW (ref 12.0–15.0)
MCH: 25.9 pg — ABNORMAL LOW (ref 26.0–34.0)
MCHC: 31.9 g/dL (ref 30.0–36.0)
MCV: 81.1 fL (ref 78.0–100.0)
Platelets: 241 10*3/uL (ref 150–400)
RBC: 4.13 MIL/uL (ref 3.87–5.11)
RDW: 16.4 % — ABNORMAL HIGH (ref 11.5–15.5)
WBC: 8.5 10*3/uL (ref 4.0–10.5)

## 2010-07-19 LAB — DIFFERENTIAL
Basophils Absolute: 0 10*3/uL (ref 0.0–0.1)
Basophils Relative: 0 % (ref 0–1)
Eosinophils Absolute: 0 10*3/uL (ref 0.0–0.7)
Eosinophils Relative: 0 % (ref 0–5)
Lymphocytes Relative: 7 % — ABNORMAL LOW (ref 12–46)
Lymphs Abs: 0.6 10*3/uL — ABNORMAL LOW (ref 0.7–4.0)
Monocytes Absolute: 0.7 10*3/uL (ref 0.1–1.0)
Monocytes Relative: 9 % (ref 3–12)
Neutro Abs: 7.2 10*3/uL (ref 1.7–7.7)
Neutrophils Relative %: 84 % — ABNORMAL HIGH (ref 43–77)

## 2010-07-19 LAB — POCT CARDIAC MARKERS
CKMB, poc: 1.8 ng/mL (ref 1.0–8.0)
Myoglobin, poc: >500 ng/mL (ref 12–200)
Troponin i, poc: <0.05 ng/mL (ref 0.00–0.09)

## 2010-07-19 LAB — BRAIN NATRIURETIC PEPTIDE: Pro B Natriuretic peptide (BNP): 1850 pg/mL — ABNORMAL HIGH (ref 0.0–100.0)

## 2010-07-19 LAB — URINE MICROSCOPIC-ADD ON

## 2010-07-19 LAB — LACTIC ACID, PLASMA: Lactic Acid, Venous: 1.1 mmol/L (ref 0.5–2.2)

## 2010-07-20 DIAGNOSIS — I517 Cardiomegaly: Secondary | ICD-10-CM

## 2010-07-20 LAB — MRSA PCR SCREENING: MRSA by PCR: NEGATIVE

## 2010-07-21 DIAGNOSIS — I2589 Other forms of chronic ischemic heart disease: Secondary | ICD-10-CM

## 2010-07-21 DIAGNOSIS — I509 Heart failure, unspecified: Secondary | ICD-10-CM

## 2010-07-21 LAB — BASIC METABOLIC PANEL
CO2: 24 mEq/L (ref 19–32)
Chloride: 105 mEq/L (ref 96–112)
Creatinine, Ser: 0.81 mg/dL (ref 0.4–1.2)
GFR calc Af Amer: >60 mL/min (ref >60)
Potassium: 3.4 mEq/L — ABNORMAL LOW (ref 3.5–5.1)

## 2010-07-22 LAB — BASIC METABOLIC PANEL
CO2: 22 mEq/L (ref 19–32)
Calcium: 8.5 mg/dL (ref 8.4–10.5)
Chloride: 106 mEq/L (ref 96–112)
GFR calc Af Amer: >60 mL/min (ref >60)
Glucose, Bld: 89 mg/dL (ref 70–99)
Sodium: 135 mEq/L (ref 135–145)

## 2010-07-23 LAB — BASIC METABOLIC PANEL
CO2: 22 mEq/L (ref 19–32)
Calcium: 8.5 mg/dL (ref 8.4–10.5)
Chloride: 106 mEq/L (ref 96–112)
GFR calc Af Amer: >60 mL/min (ref >60)
Glucose, Bld: 84 mg/dL (ref 70–99)
Potassium: 4.1 mEq/L (ref 3.5–5.1)
Sodium: 135 mEq/L (ref 135–145)

## 2010-07-24 LAB — CULTURE, BLOOD (ROUTINE X 2): Culture: NO GROWTH

## 2010-07-24 LAB — BASIC METABOLIC PANEL
BUN: 13 mg/dL (ref 6–23)
CO2: 23 mEq/L (ref 19–32)
Chloride: 109 mEq/L (ref 96–112)
GFR calc non Af Amer: >60 mL/min (ref >60)
Glucose, Bld: 87 mg/dL (ref 70–99)
Potassium: 3.7 mEq/L (ref 3.5–5.1)
Sodium: 141 mEq/L (ref 135–145)

## 2010-07-27 NOTE — H&P (Signed)
  NAMEJAILEN, [name]                 ACCOUNT NO.:  [id number]  MEDICAL RECORD NO.:  [id number]           PATIENT TYPE:  LOCATION:                                 FACILITY:  PHYSICIAN:  Aundria Bitterman L. Luan Pulling, M.D.DATE OF BIRTH:  03/07/1941  DATE OF ADMISSION: DATE OF DISCHARGE:  LH                             HISTORY & PHYSICAL   PRIMARY PHYSICIAN:  Patient of Dr. Legrand Rams.  REASON FOR ADMISSION:  Pneumonia versus CHF.  HISTORY:  [name] is a 70 year old who came to the emergency room because of shortness of breath.  She has had this for 2-3 days.  She has had cough and then congestion and when she came to the emergency room, her temperature was 103 orally.  She said that she has had some chest discomfort.  She has not had any PND or orthopnea.  She has not coughed up any sputum.  She has not had any hemoptysis and no chills.  PAST MEDICAL HISTORY:  Positive for; 1. Coronary artery occlusive disease. 2. Hypertension. 3. History of breast cancer. 4. Hyperlipidemia. 5. I think she has had an episode of congestive heart failure by     history.  SOCIAL HISTORY:  She does not use any alcohol or illicit drugs.  She apparently has been using tobacco in the form of snuff.  FAMILY HISTORY:  Positive for coronary disease.  MEDICATIONS:  At home; 1. Aspirin 81 mg daily. 2. Carvedilol 25 mg b.i.d. 3. Lasix 40 mg b.i.d. 4. Lisinopril 40 mg daily. 5. Lovastatin 20 mg daily.  REVIEW OF SYSTEMS:  Except as mentioned is negative.  PHYSICAL EXAMINATION:  VITAL SIGNS:  Temperature as mentioned was 103 orally, blood pressure 150/56, pulse 83 and regular, respirations are 18. HEENT:  Her pupils are reactive to light and accommodation.  Nose and throat are clear.  Mucous membranes are moist. NECK:  Supple without masses.  She does not have any JVD. CHEST:  Rhonchi bilaterally, not lot of rales. ABDOMEN:  Soft without masses.  Bowel sounds present and active. EXTREMITIES:  Trace to 1+  edema. CENTRAL NERVOUS SYSTEM:  Grossly intact.  Her chest x-ray is consistent with bilateral pneumonia.  Her white count is only 8000, however.  The rest of her lab work is pending, but as I was dictating that and after I had written her orders, her BNP came back very high.  She may have some sort of a combination, obviously she has got fever which would be atypical for heart failure.  She has got elevated BNP, however.  I am going to go and give her Lasix.  I am going to put her on antibiotics for community-acquired pneumonia.  She will continue the rest of her medicines and follow from there.     Essence Merle L. Luan Pulling, M.D.     ELH/MEDQ  D:  07/19/2010  T:  07/20/2010  Job:  [id number]  Electronically Signed by Sinda Du M.D. on 07/27/2010 08:51:48 AM

## 2010-07-31 LAB — BASIC METABOLIC PANEL
BUN: 11 mg/dL (ref 6–23)
CO2: 27 mEq/L (ref 19–32)
Chloride: 104 mEq/L (ref 96–112)
GFR calc Af Amer: >60 mL/min (ref >60)
GFR calc non Af Amer: >60 mL/min (ref >60)
GFR calc non Af Amer: >60 mL/min (ref >60)
Glucose, Bld: 127 mg/dL — ABNORMAL HIGH (ref 70–99)
Potassium: 3.2 mEq/L — ABNORMAL LOW (ref 3.5–5.1)
Potassium: 3.6 mEq/L (ref 3.5–5.1)
Sodium: 141 mEq/L (ref 135–145)

## 2010-07-31 LAB — BRAIN NATRIURETIC PEPTIDE: Pro B Natriuretic peptide (BNP): 828 pg/mL — ABNORMAL HIGH (ref 0.0–100.0)

## 2010-07-31 LAB — BASIC METABOLIC PANEL WITH GFR
Calcium: 9 mg/dL (ref 8.4–10.5)
Chloride: 105 meq/L (ref 96–112)
Creatinine, Ser: 0.79 mg/dL (ref 0.4–1.2)
GFR calc Af Amer: >60 mL/min (ref >60)
Sodium: 142 meq/L (ref 135–145)

## 2010-07-31 LAB — URINE CULTURE: Colony Count: NO GROWTH

## 2010-07-31 LAB — TROPONIN I: Troponin I: 0.03 ng/mL (ref 0.00–0.06)

## 2010-07-31 LAB — CK TOTAL AND CKMB (NOT AT ARMC)
CK, MB: 1.7 ng/mL (ref 0.3–4.0)
Relative Index: 1.1 (ref 0.0–2.5)
Total CK: 149 U/L (ref 7–177)

## 2010-07-31 LAB — URINALYSIS, MICROSCOPIC ONLY
Glucose, UA: NEGATIVE mg/dL
Ketones, ur: NEGATIVE mg/dL
Leukocytes, UA: NEGATIVE
Nitrite: NEGATIVE
Protein, ur: NEGATIVE mg/dL

## 2010-07-31 LAB — DIFFERENTIAL
Basophils Absolute: 0 K/uL (ref 0.0–0.1)
Basophils Relative: 1 % (ref 0–1)
Eosinophils Absolute: 0.1 10*3/uL (ref 0.0–0.7)
Eosinophils Relative: 1 % (ref 0–5)
Lymphocytes Relative: 16 % (ref 12–46)
Lymphs Abs: 1.1 10*3/uL (ref 0.7–4.0)
Monocytes Absolute: 0.7 10*3/uL (ref 0.1–1.0)
Monocytes Relative: 11 % (ref 3–12)
Neutro Abs: 4.7 K/uL (ref 1.7–7.7)
Neutrophils Relative %: 72 % (ref 43–77)

## 2010-07-31 LAB — CBC
HCT: 29.3 % — ABNORMAL LOW (ref 36.0–46.0)
Hemoglobin: 9.7 g/dL — ABNORMAL LOW (ref 12.0–15.0)
MCHC: 33.1 g/dL (ref 30.0–36.0)
MCV: 80.7 fL (ref 78.0–100.0)
Platelets: 241 10*3/uL (ref 150–400)
Platelets: 254 10*3/uL (ref 150–400)
RBC: 3.63 MIL/uL — ABNORMAL LOW (ref 3.87–5.11)
RDW: 15.1 % (ref 11.5–15.5)
RDW: 15.8 % — ABNORMAL HIGH (ref 11.5–15.5)
WBC: 6.1 10*3/uL (ref 4.0–10.5)
WBC: 6.6 K/uL (ref 4.0–10.5)

## 2010-07-31 LAB — LIPID PANEL
Cholesterol: 103 mg/dL (ref 0–200)
HDL: 21 mg/dL — ABNORMAL LOW (ref >39)
LDL Cholesterol: 65 mg/dL (ref 0–99)
Total CHOL/HDL Ratio: 4.9 RATIO

## 2010-07-31 LAB — HEPATIC FUNCTION PANEL
ALT: 13 U/L (ref 0–35)
AST: 16 U/L (ref 0–37)
Bilirubin, Direct: <0.1 mg/dL (ref 0.0–0.3)
Total Protein: 7.4 g/dL (ref 6.0–8.3)

## 2010-07-31 LAB — CARDIAC PANEL(CRET KIN+CKTOT+MB+TROPI)
CK, MB: 1.4 ng/mL (ref 0.3–4.0)
Relative Index: 1.1 (ref 0.0–2.5)
Relative Index: 1.2 (ref 0.0–2.5)
Total CK: 137 U/L (ref 7–177)
Troponin I: 0.02 ng/mL (ref 0.00–0.06)

## 2010-07-31 LAB — TSH: TSH: 0.7 u[IU]/mL (ref 0.350–4.500)

## 2010-08-08 LAB — DIFFERENTIAL
Basophils Relative: 1 % (ref 0–1)
Lymphocytes Relative: 44 % (ref 12–46)
Monocytes Absolute: 0.5 10*3/uL (ref 0.1–1.0)
Monocytes Relative: 11 % (ref 3–12)
Neutro Abs: 2.1 10*3/uL (ref 1.7–7.7)
Neutrophils Relative %: 42 % — ABNORMAL LOW (ref 43–77)

## 2010-08-08 LAB — CBC
HCT: 31.2 % — ABNORMAL LOW (ref 36.0–46.0)
Hemoglobin: 10.3 g/dL — ABNORMAL LOW (ref 12.0–15.0)
MCHC: 33 g/dL (ref 30.0–36.0)
MCV: 80.9 fL (ref 78.0–100.0)
Platelets: 263 10*3/uL (ref 150–400)
RDW: 17 % — ABNORMAL HIGH (ref 11.5–15.5)

## 2010-08-08 LAB — IRON AND TIBC
Iron: 50 ug/dL (ref 42–135)
Saturation Ratios: 25 % (ref 20–55)
UIBC: 149 ug/dL

## 2010-08-08 LAB — COMPREHENSIVE METABOLIC PANEL
Albumin: 3.1 g/dL — ABNORMAL LOW (ref 3.5–5.2)
Alkaline Phosphatase: 66 U/L (ref 39–117)
BUN: 21 mg/dL (ref 6–23)
Calcium: 9 mg/dL (ref 8.4–10.5)
Creatinine, Ser: 1.1 mg/dL (ref 0.4–1.2)
Glucose, Bld: 106 mg/dL — ABNORMAL HIGH (ref 70–99)
Total Protein: 7.7 g/dL (ref 6.0–8.3)

## 2010-08-08 LAB — PROTEIN ELECTROPH W RFLX QUANT IMMUNOGLOBULINS
Albumin ELP: 42.1 % — ABNORMAL LOW (ref 55.8–66.1)
Alpha-1-Globulin: 5.6 % — ABNORMAL HIGH (ref 2.9–4.9)
Beta Globulin: 4 % — ABNORMAL LOW (ref 4.7–7.2)
Total Protein ELP: 7.6 g/dL (ref 6.0–8.3)

## 2010-08-08 LAB — FERRITIN: Ferritin: 120 ng/mL (ref 10–291)

## 2010-08-08 LAB — IMMUNOFIXATION ELECTROPHORESIS: IgG (Immunoglobin G), Serum: 2010 mg/dL — ABNORMAL HIGH (ref 694–1618)

## 2010-08-10 ENCOUNTER — Encounter: Payer: Self-pay | Admitting: Adult Health

## 2010-08-10 ENCOUNTER — Ambulatory Visit (INDEPENDENT_AMBULATORY_CARE_PROVIDER_SITE_OTHER): Payer: MEDICARE | Admitting: Adult Health

## 2010-08-10 DIAGNOSIS — E785 Hyperlipidemia, unspecified: Secondary | ICD-10-CM

## 2010-08-10 DIAGNOSIS — I2510 Atherosclerotic heart disease of native coronary artery without angina pectoris: Secondary | ICD-10-CM

## 2010-08-10 DIAGNOSIS — I5022 Chronic systolic (congestive) heart failure: Secondary | ICD-10-CM

## 2010-08-10 NOTE — Assessment & Plan Note (Signed)
I have reviewed each of her medications with her and discussing their purpose.  She is advised to weigh herself everyday and to report wt gain of 2-3 lbs in 1-2 days. I have discussed the need to have a digital scale so that her weight can be easily read and written down. She is to keep track of her weight and bring those recordings with her to follow-up appointment.    She is given low salt diet in writing as well as my discussion with her.  She states that she eats sub sandwiches a lot and I have advised her not to do so.   I will follow up with her in one month, BMET will be completed at that time.

## 2010-08-10 NOTE — Patient Instructions (Signed)
Your physician recommends that start a low salt diet, please see handout given to you today.  Your physician recommends that you weigh, daily, at the same time every day, and in the same amount of clothing. Please record your daily weights on the handout provided and bring it to your next appointment. Please call office for weight gain  Your physician recommends that you schedule a follow-up appointment in: 1 month

## 2010-08-10 NOTE — Assessment & Plan Note (Signed)
She is asymptomatic at this time.  Continue current medication regimen.

## 2010-08-10 NOTE — Assessment & Plan Note (Signed)
As far as I can tell, this was not checked in the hospital admission.  I will check this on next blood draw in one month.

## 2010-08-10 NOTE — Progress Notes (Signed)
HPI: [name] is a 71 y/o AAF we are following for continued assessment and treatment of systolic CHF, ischemic cardiomyopathy, hypertension.  She was recently admitted to [facility] with episode of A/C CHF and pneumonia. We saw her on consult and assisted with diuretic management.  She has a history of medical noncompliance and dietary noncompliance leading to admissions.  She was sent home on spironolactone, Lasix 40mg  BID, and carvedilol.  She states she is compliant with her medications, but not always complaint with diet.    Weight is down 4 lbs since last visit, but in [facility] it was down to 146 lbs.[name] on discharge was 151 lbs.  She denies symptoms of tachycardia, DOE or weakness.  She is able to sleep flat.  She is frequently tired and sometimes has waves of nausea.  No Known Allergies  Current Outpatient Prescriptions  Medication Sig Dispense Refill  . acetaminophen (TYLENOL) 325 MG tablet Take 650 mg by mouth every 6 (six) hours as needed.        . ALPRAZolam (XANAX) 0.25 MG tablet Take 0.25 mg by mouth at bedtime as needed.        [name] aspirin 81 MG tablet Take 81 mg by mouth daily.        . carvedilol (COREG) 25 MG tablet Take 25 mg by mouth 2 (two) times daily with a meal.        . furosemide (LASIX) 40 MG tablet Take 40 mg by mouth 2 (two) times daily.        [name] lisinopril (PRINIVIL,ZESTRIL) 40 MG tablet Take 40 mg by mouth daily.        [name] lovastatin (MEVACOR) 20 MG tablet Take 20 mg by mouth at bedtime.        [name] spironolactone (ALDACTONE) 25 MG tablet Take 25 mg by mouth daily.         Past Medical History  Diagnosis Date  . CAD (coronary artery disease)     reported remote PTCA  . Cardiomyopathy     ischemic LVEF 40-45%  . Anemia   . Hyperlipidemia   . Breast cancer   . Arthritis     Past Surgical History  Procedure Date  . Mastectomy 1998  . Total knee arthroplasty     right Dr.Harrison    [id number] of systems complete and found to be negative unless listed above PHYSICAL  EXAM BP 109/52  Pulse 64  Ht 5\' 4"  (1.626 m)  Wt 156 lb (70.761 kg)  BMI 26.78 kg/m2  SpO2 95% General: Well developed, well nourished, in no acute distress Head: Eyes PERRLA, No xanthomas.   Normal cephalic and atramatic  Lungs: Clear bilaterally to auscultation and percussion. Heart: HRRR S1 S2, S3 murmur  Pulses are 2+ & equal.            No carotid bruit. No JVD.  No abdominal bruits. No femoral bruits. Abdomen: Bowel sounds are positive, abdomen soft and non-tender, mild distention Msk:  Back normal, normal gait. Normal strength and tone for age. Extremities: No clubbing, cyanosis or edema.  DP +1 Neuro: Alert and oriented X 3. Psych:  Good affect, responds appropriately  Assessment and Plan:

## 2010-08-16 NOTE — Discharge Summary (Signed)
  NAMELYNNETT, CRESSEY                 ACCOUNT NO.:  [id number]  MEDICAL RECORD NO.:  [id number]           PATIENT TYPE:  I  LOCATION:  A340                          FACILITY:  [facility]  PHYSICIAN:  Penelope Fittro D. Legrand Rams, MD   DATE OF BIRTH:  1940/08/05  DATE OF ADMISSION:  07/19/2010 DATE OF DISCHARGE:  04/02/2012LH                              DISCHARGE SUMMARY   DISCHARGE DIAGNOSES: 1. Community acquired pneumonia. 2. Acute on chronic systolic congestive heart failure. 3. Ischemic cardiomyopathy. 4. Coronary artery disease. 5. Anemia. 6. Hypertension. 7. Hyperlipidemia. 8. History of cancer of the breast.  DISCHARGE MEDICATIONS: 1. Augmentin 500 mg p.o. t.i.d. for 5 more days. 2. Aspirin 81 mg p.o. daily. 3. Carvedilol 25 mg b.i.d. 4. Lasix 40 mg b.i.d. 5. Lisinopril 40 mg daily. 6. Spirolactone 25 mg p.o. daily. 7. Tylenol 25 mg p.o. daily p.r.n. for pain.  DISPOSITION:  The patient will be discharged home in stable condition.  LABORATORY FINDINGS:  On discharge; sodium 141, potassium 3.7, chloride 109, carbon dioxide 23, glucose 87, BUN 13, creatinine 0.7, calcium 8.5, BNP 233.  DISPOSITION:  The patient will be discharged home in stable condition.  DISCHARGE INSTRUCTIONS:  The patient will be followed in the outpatient and the Cardiology Clinic.  She is advised to continue her medications and follow her regular appointment.  HOSPITAL COURSE:  This is a 70 year old female patient with a history of multiple medical illnesses came to emergency room due to shortness of breath.  She is a known case of congestive heart failure who has been taking her medication regularly.  During her evaluation in the emergency room, the patient had a temperature of 103 degrees Fahrenheit.  Her chest x-ray showed progressive bilateral lower lobe opacity compatible with a pneumonia.  The patient was admitted and started on combination IV antibiotics.  Her BNP also was over 1500 and she was  started on IV diuretics.  An Echocardiogram and Cardiology consult was requested. Echocardiogram showed an ejection fraction of about 20%.  Cardiology evaluation was done and her medications were adjusted.  Over the hospital stay, the patient's condition improved.  Her BNP dropped down to 233.  Overall, the patient improved and she is back to her baseline.  She will be discharged home and followed in outpatient including Cardiology Clinic.     Shekela Goodridge D. Legrand Rams, MD     TDF/MEDQ  D:  07/24/2010  T:  07/24/2010  Job:  [id number]  Electronically Signed by [name] MD on 08/16/2010 08:33:23 AM

## 2010-08-26 NOTE — Consult Note (Signed)
NAMECHARDE, Savannah                 ACCOUNT NO.:  [id number]  MEDICAL RECORD NO.:  [id number]           PATIENT TYPE:  I  LOCATION:  A340                          FACILITY:  [facility]  PHYSICIAN:  Cristopher Estimable. Lattie Haw, MD, FACCDATE OF BIRTH:  [birth year]  DATE OF CONSULTATION:  07/21/2010 DATE OF DISCHARGE:                                CONSULTATION   PRIMARY CARDIOLOGIST:  Satira Sark, MD.  PRIMARY CARE PHYSICIAN:  [name] D. Legrand Rams, MD.  ONCOLOGIST:  Gaston Islam. Neijstrom, MD  REASON FOR CONSULTATION:  CHF with history of ischemic cardiomyopathy.  HISTORY OF PRESENT ILLNESS:  A 47-year African American female with known history of ischemic cardiomyopathy with an EF of 20% per echo in March [id number], CAD, systolic CHF, hypertension, admitted to [facility] ER for increasing dyspnea over the last 5 days prior to admission with coughing predominantly.  The patient was seen by Dr. Legrand Rams a week prior to admission and was placed on some antibiotics for bronchitis. However, the symptoms did not improve.  The patient admits to some edema on and off for the last month prior to admission.  On arrival, the patient's blood pressure was 150/56 with a heart rate of 83 and respirations 16.  She also admits to anorexia over the last 3-4 days. Prior to admit, saying she was coughing so much and feeling nauseated, and therefore did not want to eat.  She also admits to medical noncompliance with her Lasix, states that she does not like having to go to the bathroom so often, it is prescribed for her 40 mg b.i.d., however, she only takes it once a day and only takes it on occasion. Currently, the patient has been placed on IV Lasix with no accurate I and O.  Her weight is down considerably since being seen last in our office.  When we saw her in August 2011, her weight was 160 pounds, current weight is 146 pounds.  Therefore, dry weight is uncertain.  REVIEW OF SYSTEMS:  Positive for chest pressure and  soreness from coughing, dyspnea on exertion, cough, wheezing, low-grade nausea, and anorexia.  All other systems are reviewed and found to be negative.  CODE STATUS:  Full code.  PAST MEDICAL HISTORY: 1. Ischemic cardiomyopathy with an EF of 20% per March 2011. 2. CAD. a:  Status post PCI to unknown artery at unknown date. 1. History of admissions for CHF. 2. Anemia (baseline hemoglobin 9 to 10). 3. Hypertension. 4. Hyperlipidemia. 5. Breast cancer. a:  Status post left radical mastectomy. 1. Most recent stress Myoview in 2009 was negative for inducible     ischemia, but she did have a scar in the septal inferior and apical     distribution.  PAST SURGICAL HISTORY:  Left radical mastectomy and right total knee replacement.  SOCIAL HISTORY:  She lives in [place] with her husband.  She is married.  She uses snuff daily.  Negative for EtOH or drug use.  FAMILY HISTORY:  Mother deceased from an unknown cause.  Father deceased from cancer and complications of diabetes.  MEDICATIONS PRIOR TO ADMISSION: 1. Aspirin 81  mg daily. 2. Carvedilol 25 mg b.i.d. 3. Lasix 40 mg b.i.d. 4. Lisinopril 40 mg daily. 5. Lovastatin 20 mg at bedtime.  ALLERGIES:  No known drug allergies.  CURRENT LABS:  BNP on admission 1850.  Sodium 136, potassium 3.4, chloride 105, CO2 24, BUN 16, creatinine 0.81, glucose 93, hemoglobin 10.7, hematocrit 33.5, white blood cells 8.5, platelets 241.  EKG revealing normal sinus rhythm with a left bundle-branch block, rate of 81 beats per minute.  Chest x-ray dated July 19, 2010, progressive bilateral lobe opacities compatible with pneumonia with cardiac enlargement noted.  PHYSICAL EXAM:  VITAL SIGNS:  Blood pressure 133/70, pulse 69, respirations 20, temperature 100.0 (this is her T-max as well), O2 sat 98% on 2 liters.  Her current weight is 146 pounds or 66.6 kg. Admission weight 70.6 kg. GENERAL:  She is awake, alert, and oriented, in no acute  distress with exception of frequent coughing. HEENT:  Head is normocephalic and atraumatic. EYES:  PERRLA. NECK:  Supple.  There is mild JVD at 10 cm with HJR.  No thyromegaly or carotid bruits. CARDIOVASCULAR:  Distant heart sounds, obscured by lung sounds with a 1/6 systolic murmur at the left sternal border.  Pulses are 2+ and equal. LUNGS:  Bilateral crackles to the middle lobes, left greater than right with frequent coughing. ABDOMEN:  Soft, nontender, 2+ bowel sounds. EXTREMITIES:  Without clubbing, cyanosis, or edema. NEURO:  Cranial nerves II-XII are grossly intact.  IMPRESSION: 1. Acute on chronic systolic congestive heart failure.  She admits to     medical noncompliance with Lasix, states that she has not felt well     and it makes her urinate too much.  She is supposed to be on     b.i.d., but only takes it once a day when she does take it at all.     We will increase her Lasix to 40 mg IV b.i.d. x24 hours.  Potassium     is being repleted with spironolactone with no further potassium     replacement.  Uncertain of her dry weight and she has lost     approximately 20 pounds since we have seen her last and monitor her     closely with strict I and Os, would also recommend a 1200 mL fluid     restriction at this time. 2. Ischemic cardiomyopathy, which may be mixed.  She continues on     Coreg and lisinopril.  Denies palpitations, dizziness, or     presyncope.  Would not consider ICD as she is noncompliant with     medications.  Also, she refuses any invasive cardiac measures or     surgery. 3. Anemia.  This is stable based upon her history of hemoglobin     running between 9 and 10, therefore, no other recommendations     concerning this.  She is followed by Dr. Tressie Stalker and should     continue to do so as an outpatient.  On behalf of the physicians and providers of [facility], we would like to thank Dr. Legrand Rams for allowing [place] to participate in the care of this  patient.     Phill Myron. Purcell Nails, NP   ______________________________ Cristopher Estimable. Lattie Haw, MD, [facility]    KML/MEDQ  D:  07/21/2010  T:  07/22/2010  Job:  [id number]  cc:   [name] D. Legrand Rams, MD Fax: [phone]  Electronically Signed by Jory Sims NP on 07/24/2010 08:02:16 AM Electronically Signed by Jacqulyn Ducking MD  FACC on 08/26/2010 10:16:04 PM

## 2010-09-05 NOTE — Assessment & Plan Note (Signed)
[facility] OFFICE NOTE   [name], [name]                        MRN:          [id number]  DATE:04/08/2008                            DOB:          [birth year]    CARDIOLOGIST:  She is new to Satira Sark, MD.   PRIMARY CARE PHYSICIAN:  Norwood Levo. Moshe Cipro, M.D.   REASON FOR VISIT:  Shortness of breath.   HISTORY OF PRESENT ILLNESS:  [name] is a 70 year old female patient  previously followed by Dr. Dorris Carnes now followed by Dr. Domenic Polite who  has a history of ischemic cardiomyopathy with an EF of 30-35% in the  past.  She had been followed by our group years ago and the records  indicate she has had a prior coronary intervention.  However, the  records of that intervention have never been obtained.  There was a  heart catheterization from May 1995 that demonstrated total occlusion of  the RCA, 40% stenosis in the first diagonal and 30% stenosis in the  proximal circumflex.  However, the disposition mentioned that medical  therapy will be tried for the LAD lesion before attempting angioplasty.  The patient has had her medications adjusted over the last several  months.  She also had an echocardiogram performed in September 2009 that  demonstrated a mildly dilated left ventricle and mildly decreased left  ventricular systolic function.  She had akinesis at the base of the  inferior wall, hypokinesis of the mid-distal posterior wall, moderate  hypokinesis at the base of the anterolateral wall and inferoseptal  walls.  She also had mild mitral regurgitation.  She missed a routine  follow up visit recently.  She then called in and noted significant  shortness of breath and some chest discomfort and was brought in today  for further evaluation.   The patient, over the last month, has noted shortness of breath with  exertion.  She is somewhat of a difficult historian.  It sounds as  though she is  describing NYH Class III symptoms.  She also is describing  symptoms of orthopnea as well as paroxysmal nocturnal dyspnea, increased  abdominal girth and lower extremity edema.  She has had some fleeting  sharp chest pains that last for just a second.  She has had some  radiation up into her neck.  This seems to come on when she gets more  short of breath with exertion.  She denies any symptoms just at rest.  She denies any syncope or near syncope.  She denies missing any of her  medications.  She notes that none of her medications have been adjusted  since her symptoms began.  Over the last 7-10 days, she has noted some  improvement and is feeling much better as compared to 4 weeks ago.  She  now denies any further orthopnea or paroxysmal nocturnal dyspnea.  Her  edema in her legs is gone.  She is still somewhat short of breath with  exertion.  She is probably describing NYHA Class 2B symptoms now.   CURRENT MEDICATIONS:  Lovastatin 20 mg daily,  Furosemide 20 mg b.i.d.,  Lisinopril 40 mg daily, Spironolactone 25 mg daily, Aspirin 81 mg daily,  Niaspan 500 mg daily, Carvedilol 12.5 mg b.i.d., Alprazolam p.r.n.  1. Stool softener.   SOCIAL HISTORY:  She is an ex-smoker.   REVIEW OF SYSTEMS:  Please see HPI.  She denies any fevers, chills.  She  has had a nonproductive cough.  She denies melena, hematochezia,  hematuria, dysuria.  Rest of the review of systems are negative.   PHYSICAL EXAMINATION:  GENERAL:  She is a well-nourished, well-developed  female in no acute distress.  VITAL SIGNS:  Blood pressure is 178/89, pulse 92, weight 162 pounds.  HEENT:  Normal.  NECK:  With positive JVD.  CARDIAC:  Normal S1 and S2,  positive S3.  No murmur.  LUNGS:  Bibasilar crackles, otherwise clear.  No wheezing.  ABDOMEN:  Soft, nontender.  It is somewhat distended.  EXTREMITIES:  Without edema.  NEUROLOGIC:  She is alert and oriented x3.  Cranial nerves II-XII are  grossly intact.  VASCULAR:   Without carotid bruits bilaterally.   ASSESSMENT/PLAN:  1. Acute on chronic systolic congestive heart failure.  The patient      has a history of ischemic cardiomyopathy with a previous EF of 30-      35%, but improved LV function by most recent echocardiogram.  She      denies medication noncompliance.  She also denies dietary      indiscretion.  At this point, she has improved somewhat since her      symptoms began.  I have recommended that she increase her Lasix to      40 mg b.i.d.  I also had Dr. Lattie Haw see the patient today.  We      considered increasing her Coreg to better control her blood      pressure.  However, we will wait to see what her blood pressure      does with the increase in her diuresis and make further adjustments      in her antihypertensive regimen when we see her back in followup.      She will have lab work to include a BMET, CBC and BNP.  She will      also have a chest x-ray.  We will see her back in early followup      next week.  She will also have a BMET drawn in 1 week's time to      follow up on renal function and potassium.  2. Hypertension.  As outlined above.  We will reassess this next week      when we see her back in followup.  3. Dyslipidemia.  Adjustments have recently been made by Dr. Domenic Polite.      She will followup as indicated.   DISPOSITION:  The patient will return for followup in 1 week for further  recommendations.     Richardson Dopp, PA-C  Electronically Signed      Cristopher Estimable. Lattie Haw, MD, [facility]  Electronically Signed   SW/MedQ  DD: 04/08/2008  DT: 04/08/2008  Job #: [id number]

## 2010-09-05 NOTE — Discharge Summary (Signed)
[name], [name]                 ACCOUNT NO.:  [id number]   MEDICAL RECORD NO.:  [id number]          PATIENT TYPE:  INP   LOCATION:  A202                          FACILITY:  [facility]   PHYSICIAN:  [name] D. Legrand Rams, MD   DATE OF BIRTH:  07/10/1940   DATE OF ADMISSION:  07/11/2007  DATE OF DISCHARGE:  03/26/2009LH                               DISCHARGE SUMMARY   DISCHARGE DIAGNOSES:  1. Acute systolic congestive heart failure with low ejection fraction.  2. Hypertension.  3. Hypokalemia.  4. Chronic bronchitis.  5. History of carcinoma of the breast and status post radical      mastectomy.   DISCHARGE MEDICATIONS:  1. Amlodipine 10 mg daily.  2. Metoprolol 50 mg b.i.d.  3. Aspirin 81 mg daily.  4. Lasix 40 mg daily.  5. Aldactone 25 mg daily.  6. Lisinopril 20 mg daily.   DISPOSITION:  The patient was discharged to home in stable condition.   HOSPITAL COURSE:  This is a 70 year old female patient with a history of  hypertension and chronic bronchitis was admitted due to shortness of  breath.  The patient had congestive changes on her chest x-ray and her  BNP was greater than 1000.  The patient was admitted and started on IV  diuretics.  She was evaluated by a cardiologist and echocardiogram was  done.  Her ejection fraction was between 30 and 35.  The patient  continued on diuresis.  She gradually improved and was discharged to  home to continue her current treatment and followup with cardiology in  outpatient.      [name] D. Legrand Rams, MD  Electronically Signed     TDF/MEDQ  D:  08/15/2007  T:  08/15/2007  Job:  [id number]

## 2010-09-05 NOTE — Assessment & Plan Note (Signed)
[facility] OFFICE NOTE   [name], [name]                        MRN:          [id number]  DATE:05/27/2008                            DOB:          Oct 21, 1940    CARDIOLOGIST:  Satira Sark, MD   PRIMARY CARE PHYSICIAN:  [name] D. Legrand Rams, MD   REASON FOR VISIT:  Three-month followup.   HISTORY OF PRESENT ILLNESS:  [name] is a 70 year old female patient  with a history of probable ischemic cardiomyopathy with a previous EF of  30-35% and coronary artery disease with questionable history of  angioplasty in the past, although we have never received records to that  fact who last was studied with echocardiography in 02-04-2008, that  demonstrated just mildly decreased LV systolic function.  I last saw her  in December 2009.  She had been treated for an acute episode of heart  failure at that time.  She continues on Lasix therapy.  She has been  somewhat confused about her carvedilol.  She was to increase this to  18.75 mg twice a day, but she has been taking essentially 50 mg once a  day.  We had asked her to get back in touch with her primary care  physician for further workup of her anemia.  We did check some labs and  her iron levels were low at 32 and her ferritin, B12, and folate were  all normal.  She has apparently seen Dr. Tressie Stalker and/or Dr. Legrand Rams, and  she tells me that someone has been drawing blood on her to find out why  her blood counts are low.  Recent blood work on February 1, demonstrated  a hemoglobin of 10.3, MCV 80.9, potassium 3.5, creatinine 1.1.  She  denies any chest discomfort other than dyspepsia with meals.  She denies  orthopnea, PND, or significant shortness of breath.  She describes NYHA  class II symptoms.  She denies pedal edema.  She denies syncope.   CURRENT MEDICATIONS:  1. Lovastatin 20 mg daily.  2. Lisinopril 40 mg daily.  3. Aspirin 81 mg daily.  4.  Niaspan 500 mg daily.  5. Carvedilol 12.5 mg 4 tablets a day.  6. Furosemide 40 mg b.i.d.  7. Alprazolam p.r.n.  8. Stool softener.   PHYSICAL EXAMINATION:  GENERAL:  She is a well-nourished well-developed  female in no acute distress.  VITAL SIGNS:  Blood pressure is 149/77, repeat blood pressure is 122/68,  pulse 79, weight 158 pounds.  HEENT:  Normal.  NECK:  Without JVD.  CARDIAC:  Normal S1, S2.  Regular rate and rhythm.  LUNGS:  Clear to auscultation bilaterally.  ABDOMEN:  Soft, nontender.  EXTREMITIES:  Without edema.  NEUROLOGIC:  She is alert and oriented x3.  Cranial nerves II through  XII grossly intact.   Electrocardiogram reveals sinus rhythm with a heart rate 64, left bundle  branch block, frequent PVCs.   ASSESSMENT AND PLAN:  1. Chronic systolic congestive heart failure in a 70 year old patient      with history of probable ischemic cardiomyopathy with previous  ejection fraction of 30-35% improved, mild left ventricular      dysfunction by recent echocardiogram.  Overall, she is stable.  She      seems optivolemic.  She is on a good medical regimen.  Her      carvedilol is being taken incorrectly.  We will make sure that she      changes this to 25 mg twice a day.  Her renal function and      potassium are stable on her current dose of Lasix.  2. Normocytic anemia.  She is to follow up with her primary care      physician for this.  We will make sure the copy of the labs we      checked back in December are sent to Dr. Legrand Rams.  3. Dyslipidemia.  She is having trouble affording Niaspan.  I have      asked her to stop this and start on fish oil 1 g a day for the next      4 weeks and then increase this to 2 g a day.  We will recheck      lipids and LFTs in 3 months.  4. Coronary artery disease.  As noted above, she has had a prior      history of possible angioplasty.  We have never received those      records.  She is not having any anginal symptoms.  She  will      continue on aspirin a day.  5. Hypertension, controlled.   DISPOSITION:  Followup with Dr. Domenic Polite in 6 months or sooner p.r.n.      Savannah Dopp, PA-C  Electronically Signed      Satira Sark, MD  Electronically Signed   SW/MedQ  DD: 05/27/2008  DT: 05/28/2008  Job #: [id number]   cc:   [name] D. Legrand Rams, MD  Gaston Islam. Tressie Stalker, MD

## 2010-09-05 NOTE — Assessment & Plan Note (Signed)
[place] [facility] OFFICE NOTE   [name], [name]                        MRN:          [id number]  DATE:01/21/2008                            DOB:          March 24, 1941    PRIMARY CARE PHYSICIAN:  Norwood Levo. Moshe Cipro, MD   REASON FOR VISIT:  Followup cardiomyopathy.   HISTORY OF PRESENT ILLNESS:  This is my first meeting with [name].  She was previously followed by Dr. Harrington Challenger and actually most recently seen  by Mr. Kathlen Mody for a post hospital followup back in June.  She has a  history of coronary artery disease based on limited information,  described as two-vessel obstructive disease based on a remote cardiac  catheterization by Dr. Sherald Barge back in 1995.  The actual report  indicates a 30% stenosis of the proximal circumflex, 40% stenosis of the  first diagonal, and an occluded proximal right coronary artery which was  managed medically.  She was admitted to the hospital back in March 2009  with acute systolic heart failure and at that time, had left ventricular  ejection fraction of 30-35%.  Adenosine Myoview at that time  demonstrated an ejection fraction of 37% with septal inferior and apical  scarring but no frank ischemia.  She has been managed medically and  referred for followup blood work just recently done showing a sodium  141, potassium 3.7, BUN 22 and creatinine of 0.8.  She also had a  followup echocardiogram done and interpreted by Dr. Lattie Haw to reveal  mildly decreased left ventricle systolic function with basal inferior  akinesis, mid-to-distal posterior hypokinesis, anterolateral basal  hypokinesis and mild mitral regurgitation with mildly dilated left  atrium.  Clinically, she seems to be fairly stable with NYHA class II-  III, dyspnea on exertion, mainly precipitated by increased levels of  exertion such as going up stairs.  Otherwise, on level ground she  breathes fine.  She has occasional  chest pain which is nonexertional  and she feels this is related to acid.  She reports compliance with  her medications and brings in a list.  She states that was the medicines  that she is taking, presumably a new one added since her last visit  makes her feel hot.  She has had no palpitations or syncope.   PRESENT MEDICATIONS:  1. Aspirin 81 mg p.o. daily.  2. Omega 3 supplements 1000 mg p.o. daily.  3. Spirolactone 25 mg p.o. daily.  4. Amlodipine 10 mg p.o. daily.  5. Lisinopril 40 mg p.o. daily.  6. Metoprolol 50 mg p.o. b.i.d.  7. Lasix 20 mg p.o. daily.  8. Lovastatin 20 mg p.o. daily.  9. P.r.n. Alprazolam 0.5 mg.  10.Stool softener.   REVIEW OF SYSTEMS:  As outlined above.  Otherwise negative.   PHYSICAL EXAMINATION:  VITAL SIGNS:  Blood pressure is 140/70, heart  rate is 60, weight is 160 pounds, which is up 2 pounds from her last  visit.  GENERAL:  She is normally nourished appearing, in no acute distress.  HEENT:  Conjunctiva is normal. Oropharynx is clear with poor  dentition.  NECK:  Supple.  No elevated jugular venous pressure.  No audible bruits.  No thyromegaly is noted.  LUNGS:  Clear without labored breathing at rest.  CARDIAC:  Regular rate and rhythm.  No S3 gallop.  Soft systolic murmur  at the base.  No pericardial rub.  ABDOMEN:  Soft and nontender.  Normoactive bowel sounds.  No  thyromegaly.  EXTREMITIES:  No significant pitting edema.  Distal pulses are 1+.  SKIN:  Warm and dry.  MUSCULOSKELETAL:  No kyphosis noted.  NEUROPSYCHIATRIC:  The patient is alert and oriented x3.  Affect is  appropriate.   IMPRESSION AND RECOMMENDATIONS:  1. Apparent ischemic cardiomyopathy with mildly reduced left ventricle      systolic function by recent followup echocardiogram on medical      therapy.  Myoview in March 2009 demonstrated scar in the septal,      inferior, and apical distributions without frank ischemia, and she      is not reporting frank angina at  this time.  Followup blood work      shows normal BUN and creatinine as well as normal potassium.  I      reviewed these results with [name].  I would recommend changing      from short-acting metoprolol to Coreg for more optimal management      of her cardiomyopathy.  We will also change from omega 3      supplements to Niaspan given her low HDL of 18 and plan to follow      up lipid profile and liver function tests over the next 8 weeks.  I      will plan to see her back over the next 2 months.  2. Hypertension, not optimally controlled today.  This will need to      follow up as well.     Satira Sark, MD  Electronically Signed    SGM/MedQ  DD: 01/21/2008  DT: 01/22/2008  Job #: [id number]   cc:   Norwood Levo. Moshe Cipro, M.D.

## 2010-09-05 NOTE — Assessment & Plan Note (Signed)
[place] [facility] OFFICE NOTE   TELISSA, [name]                        MRN:          [id number]  DATE:10/16/2007                            DOB:          1940/08/23    CARDIOLOGIST:  Fay Records, MD, [facility]   PRIMARY CARE PHYSICIAN:  Spencer Moshe Cipro, MD   REASON FOR VISIT:  Two-month follow-up.   HISTORY OF PRESENT ILLNESS:  [name] is a 70 year old female patient  with a history of ischemic cardiomyopathy who was admitted to [facility] in March 2009 with acute on chronic systolic congestive heart  failure.  The patient had not been seen by Cardiology in some years.  We  still do not have records about her prior coronary intervention.  She  has a note in her chart from 2002 when she was seen by one of our PA's  with Dr. Verl Blalock.  The note does indicate that she had prior intervention  done by Dr. Driscilla Moats in 1996.  She apparently had a heart catheterization  sometime after this.  She was last seen in the office for post  hospitalization follow-up on August 15, 2007.  Unfortunately, there is no  note available from that visit.   During her hospitalization in March 2009, she had an echocardiogram that  revealed LV dysfunction with an EF of 30-35%, akinesis of the  inferoseptal walls, and hypokinesis of the posterior and lateral walls.  She had mild mitral regurgitation.  An adenosine Myoview study was  performed during her hospitalization as well that showed scar in the  septal, inferior, and apical distributions, but no ischemia and an EF of  37%.   The patient returns for followup today.  She is doing well.  She denies  any significant shortness of breath.  She describes NYHA class II  symptoms.  She denies chest pain.  She denies syncope.  She denies  palpitations.  She denies orthopnea, PND, or pedal edema.   CURRENT MEDICATIONS:  1. Lovastatin 20 mg daily.  2. Furosemide 40 mg daily.  3.  Metoprolol 50 mg b.i.d.  4. Lisinopril 20 mg daily.  5. Amlodipine 10 mg daily.  6. Spironolactone 25 mg daily.  7. Aspirin 81 mg daily.   PHYSICAL EXAMINATION:  She is a well-nourished well-developed female in  no acute distress.  Blood pressure is 110/62, pulse 72, and weight 158 pounds.  HEENT:  Normal.  NECK:  Without JVD.  CARDIAC:  Normal S1 and S2, regular rate and rhythm with a 2/6 systolic  ejection murmur heard best along the left sternal border.  LUNGS:  Clear to auscultation bilaterally.  ABDOMEN:  Soft and nontender.  EXTREMITIES:  Without edema.  NEUROLOGIC:  She is alert and oriented x3.  Cranial nerves II-XII  grossly intact.  VASCULAR:  No carotid bruits noted bilaterally.   LABORATORY DATA:  Lipid panel from March 2009, total cholesterol 111,  triglycerides 80, HDL 18, LDL 77.  BMET from July 16, 2007, potassium  4, BUN 16, and creatinine 0.85.   IMPRESSION:  1. Ischemic cardiomyopathy with  an ejection fraction of 30-35%.  2. Coronary artery disease.      a.     Records unavailable.      b.     Nonischemic Myoview in March 2009 (evidence of scar in the       septal, inferior, and apical distributions).  3. Chronic systolic congestive heart failure with New York Heart      Association class II symptoms.  4. Hypertension.  5. Hyperlipidemia with low high-density lipoprotein.  6. Carcinoma of the left breast status post mastectomy in 1992.  7. Osteoarthritis status post right total knee replacement.  8. Tobacco abuse.   PLAN:  1. Ms. [name] returns for followup.  Unfortunately, there is no note      available from her last visit.  Overall, she is doing well and she      is optivolemic on exam.  No medication changes have been made      today.  2. In looking at her medicines, she is already on a beta-blocker with      metoprolol 50 mg b.i.d.  Changing her over to carvedilol is      certainly a consideration.  However, she is doing well on her      current  medicines.  We may want to consider changing this in the      future.  However, this is the first time I have met the patient and      will not make any changes today.  3. We will check a BMET today to follow-up on her renal function and      potassium given her ACE inhibitor therapy as well as her      spironolactone therapy.  4. The patient has low HDL at 18.  With her coronary disease, her LDL      is 45 which is near goal.  I recommended she start on fish oil.  We      will recheck her lipids and LFTs in 12 weeks.  5. The patient does have an EF in the 35% range.  She has had evidence      of scar.  At this point in time, I am not certain that ICD therapy      has been discussed with the patient.  She does have a left bundle-      branch block on electrocardiogram.  I am going to arrange for her      to have a followup echocardiogram in 3 months prior to her follow-      up visit.  If her EF remains at [id number], we can certainly discuss the      possibility of referring her for EP evaluation at that time.  6. The patient will be brought back in followup in next 3 months.  We      will transition her over to Dr. Myles Gip care.      Richardson Dopp, PA-C  Electronically Signed      Fay Records, MD, [facility]  Electronically Signed   SW/MedQ  DD: 10/16/2007  DT: 10/17/2007  Job #: [id number]   cc:   Norwood Levo. Moshe Cipro, M.D.

## 2010-09-05 NOTE — H&P (Signed)
NAMEAMYRI, [name]                 ACCOUNT NO.:  [id number]   MEDICAL RECORD NO.:  [id number]          PATIENT TYPE:  INP   LOCATION:  A202                          FACILITY:  [facility]   PHYSICIAN:  [name] D. Legrand Rams, MD   DATE OF BIRTH:  03/14/1941   DATE OF ADMISSION:  07/11/2007  DATE OF DISCHARGE:  LH                              HISTORY & PHYSICAL   CHIEF COMPLAINT:  Shortness of breath.   HISTORY OF PRESENT ILLNESS:  This is a 70 year old female patient with  history of hypertension, chronic bronchitis, and CA of the breast who  came to emergency room with the above complaint.  The patient was in her  usual state of health until 3 days back, when she started noticing  shortness of breath.  Her symptoms continued to get worse.  She became  unable to ambulate.  The patient was brought to the emergency room,  where she was evaluated, and her chest x-ray showed bilateral congestive  changes  which were compatible with congestive heart failure.  The  patient also had a BNP which was greater than 1000.  The patient was  started on IV Lasix and was admitted under telemetry for further  evaluation.   REVIEW OF SYSTEMS:  The patient is complaining of shortness of breath,  generalized weakness and recurrent cough.  No fever, chills, nausea,  vomiting, abdominal pain, dysuria, urgency or frequency of urination.   PAST MEDICAL HISTORY:  1. Hypertension.  2. Chronic bronchitis.  3. Remote history of CA of the breast and is status post radical      mastectomy.   CURRENT MEDICATIONS:  1. Metoprolol 50 mg p.o. daily.  2. Norvasc 10 mg daily.  3. Aspirin 325 mg daily.  4. Hydrochlorothiazide 12.5 mg daily.  5. Allegra 180 mg p.o. daily.   SOCIAL HISTORY:  The patient is married.  She is currently retired.  The  patient has a remote history of tobacco smoking.  No history of alcohol  or substance abuse.   PHYSICAL EXAMINATION:  GENERAL:  The patient is alert, awake and acutely  sick-looking.  VITAL SIGNS:  Blood pressure 140/81, pulse 87, respiratory rate 24,  temperature 99.8 degrees Fahrenheit.  HEENT:  Pupils are equal and reactive.  NECK:  Supple.  CHEST:  Poor air entry, bilateral rhonchi and crackles at the base.  CARDIOVASCULAR:  First and second heart sounds heard.  No murmur, no  gallop.  ABDOMEN:  Soft and lax.  Bowel sounds positive.  No mass or  organomegaly.  EXTREMITIES:  There is 2+ edema.   LABORATORIES:  Troponin less than 0.5.  CK-MB 4.6 and myoglobin 90.  BNP  1060.  Sodium 139, potassium 2.8, chloride 105, carbon dioxide 27,  glucose 131, BUN 10, creatinine 0.7 and calcium 8.5.  CBC:  WBC 5.3,  hemoglobin 11.7, hematocrit 39.9, and platelets 169.   ASSESSMENT:  1. Congestive heart failure, probably secondary to left ventricular      dysfunction.  2. Hypokalemia.  3. Hypertension.  4. History of bronchitis.  5. History of cancer of the  left breast.   PLAN:  We will do serial EKGs, cardiac enzymes.  We will continue  telemetry.  We will continue the patient on IV diuretics.  We will  supplement potassium.  We will continue to monitor BMP and CBC, and we  will continue her regular medications.      [name] D. Legrand Rams, MD  Electronically Signed     TDF/MEDQ  D:  07/11/2007  T:  07/11/2007  Job:  [id number]

## 2010-09-05 NOTE — Discharge Summary (Signed)
NAMECYRIAH, [name]                 ACCOUNT NO.:  [id number]   MEDICAL RECORD NO.:  [id number]          PATIENT TYPE:  OBV   LOCATION:  4707                         FACILITY:  [facility]   PHYSICIAN:  Leonel Ramsay, MD DATE OF BIRTH:  1940-11-14   DATE OF ADMISSION:  09/27/2008  DATE OF DISCHARGE:  09/28/2008                               DISCHARGE SUMMARY   DISCHARGE DIAGNOSES:  1. Mild congestive heart failure exacerbation, likely secondary to      infectious etiology, to finish full course of antibiotic therapy,      clinically resolved, to follow up with Dr. Legrand Rams, 2D echocardiogram      results pending at the time of discharge.  2. Acute bronchitis, to finish full course antibiotic therapy with      Avelox, to follow up with her primary care physician.  3. History of coronary artery disease, status post adenosine Myoview      in March 2009 showing evidence of scar in the septal, inferior, and      apical distributions, but no evidence for inducible ischemia.      Ischemic cardiomyopathy with an ejection fraction 30-35% (from the      echocardiogram of March 2009), 2D echocardiogram from this      admission is pending at the time of discharge.  4. Hypertension, well controlled.  5. Hyperlipidemia with an LDL of 65.  6. Anemia, normocytic with a baseline hemoglobin ranging from 9 to 10,      negative iron studies, negative SPEP in February 2010.  7. History of left-sided breast cancer, status post radical mastectomy      in 1992, follows with Dr. Tressie Stalker.  8. Right total knee replacement in April 2006.   DISCHARGE MEDICATIONS:   1. Lovastatin 20 mg 1 pill once daily.  2. Lisinopril 40 mg 1 pill once daily.  3. Aspirin 81 mg 1 pill once daily.  4. Coreg 25 mg 1 pill twice daily.  5. Lasix 40 mg 1 pill twice daily.  6. Xanax 0.25 mg as needed for anxiety as per Dr. Legrand Rams.  7. Fish oil 1 g 1 pill once daily as per Dr. Josephine Cables recommendation.  8. Avelox 400 mg 1 pill once  daily for 6 more days.   The patient is advised to increase activity slowly and is advised to eat  low-sodium heart healthy diet.   DISPOSITION AND FOLLOWUP:  The patient is to follow up with Dr. Legrand Rams on  October 11, 2008, at 10:30 a.m.  At the time of hospital followup, please  follow up on the 2D echocardiogram that was still pending at the time of  discharge.  Please also assess the patient's breathing and make sure  that the patient finished her full course antibiotic therapy with Avelox  for her acute bronchitis.   PROCEDURES PERFORMED:  Chest x-ray impression:  Bilateral pleural  effusions, cardiomegaly, and interstitial pulmonary edema, appears  similar to the chest x-ray from December 2009.  BNP 828.  Cardiac  enzymes x3 are negative.  TSH is 0.7.  Urinalysis is negative for  nitrites and leukocytes.  Fasting lipid panel:  Total cholesterol of  103, HDL cholesterol 21, LDL cholesterol 65.  Hepatic function panel:  Total bilirubin 0.4, alkaline phosphatase 81, AST 16, ALT 13, total  protein 7.4, albumin 2.7.  A 2D echocardiogram is pending at the time of  discharge.   CONSULTATIONS:  None.   BRIEF ADMITTING HISTORY AND PHYSICAL:  The patient is a 70 year old  African American lady with past medical history of coronary artery  disease, ischemic cardiomyopathy, hypertension, hyperlipidemia, and  anemia who presents to the [facility] Emergency for chief complaints of  shortness of breath with cough.  Symptoms started around 1 week ago.  Insidious in onset gradually progressing associated with orthopnea but  no PND, increased by exertion, decreased by resting, associated with  cough which is productive with mucoid-looking sputum with no blood in  it.  The patient denies any chest pain, palpitations, fevers, chills,  lower extremity pain, or hemoptysis.  She denies any other complaints.   PHYSICAL EXAMINATION:  VITAL SIGNS:  Temperature 100.2, blood pressure  126/79, pulse rate of  63, respiratory rate 22, and oxygen saturation 93%  on room air.  GENERAL:  The patient is an elderly African American lady.  She is not  in any acute distress.  HEENT:  Pupils equal, round, and reacting to light.  Extraocular  movements are intact.  ENT:  Moist mucous membranes.  Oropharynx is  clear.  NECK:  Supple.  Positive jugular venous pressure up to jaw line.  No  bruit.  No thyromegaly.  RESPIRATORY:  Slightly diminished bibasilar breath sounds.  Fine  crackles occasionally in the basal fields.  No wheezes, no rhonchi.  CARDIOVASCULAR:  S1 and S2 regular in rate and rhythm.  No murmurs.  No  rubs.  S3 gallop is present.  ABDOMEN:  The abdomen is soft, nondistended, and nontender.  Bowel  sounds are positive.  EXTREMITIES:  Trace pedal edema bilaterally.  GENITOURINARY:  No CVA tenderness.  MUSCULOSKELETAL:  Prior right-sided total knee replacement.  NEUROLOGIC:  Nonfocal.  Sensorineural deafness.  PSYCHIATRY:  Appropriate.   LABORATORIES ON ADMISSION:  CBC:  White count of 6.6, hemoglobin 9.7,  platelet count 254.  Basic metabolic panel:  Sodium A999333, potassium 3.2,  chloride 105, bicarb 27, glucose 127, BUN 11, creatinine 0.79, calcium  9.0.   HOSPITAL COURSE BY PROBLEM:  1. Mild congestive heart failure exacerbation.  Given the patient's      history of coronary artery disease, ischemic cardiomyopathy with an      ejection fraction of 30-35%, we admitted her for observation to      telemetry bed.  The patient's EKG did show old left bundle-branch      block, but otherwise no acute ischemic changes.  We recycled her      cardiac enzymes to rule out the possibility of acute coronary      syndrome, which came back negative x3.  We treated her with Lasix      40 mg q.6 h. and the patient diuresed around 2 liters.  Given the      patient's mild temperature spike and an ongoing cough, we suspect      the patient's CHF exacerbation is likely secondary from the acute       bronchitis of infectious etiology and we started her on Avelox.      The patient is to finish full 7 days' course of antibiotic therapy      and  is to follow up with Dr. Legrand Rams as an outpatient on October 11, 2008, at 10:30 a.m.  We also repeated a 2D echocardiogram that was      still pending at the time of discharge and Dr. Legrand Rams is to follow      up on the 2D echo findings.  2. Hypertension.  The patient's blood pressures have been fairly well      controlled during the entire hospital and we continued her home      medications.  3. Hyperlipidemia.  The patient's LDL 65, which is very well      controlled and the patient is to continue her home medications and      is to follow up with her primary care physician as an outpatient.  4. Acute bronchitis as per problem #1.   DISCHARGE VITAL SIGNS:  Temperature 98.9, pulse rate of 68, respiratory  rate 18, O2 saturation 93% on room air, and blood pressure 144/78.   The patient on the day of discharge is alert and oriented x3 and is not  in any acute distress and is completely asymptomatic.  The patient is to  take all the medications mentioned in the discharge instructions and is  to follow up with Dr. Legrand Rams on October 11, 2008, at 10:30 a.m.      Yolonda Kida, MD  Electronically Signed      Leonel Ramsay, MD  Electronically Signed    VB/MEDQ  D:  09/28/2008  T:  09/29/2008  Job:  [id number]   cc:   [name] D. Legrand Rams, MD  Satira Sark, MD

## 2010-09-05 NOTE — Consult Note (Signed)
[name], [name]                 ACCOUNT NO.:  [id number]   MEDICAL RECORD NO.:  [id number]          PATIENT TYPE:  INP   LOCATION:  A202                          FACILITY:  [facility]   PHYSICIAN:  Cristopher Estimable. Lattie Haw, MD, FACCDATE OF BIRTH:  1940-11-06   DATE OF CONSULTATION:  DATE OF DISCHARGE:                                 CONSULTATION   REFERRING PHYSICIAN:  [name] D. Legrand Rams, MD.   PREVIOUS PRIMARY CARDIOLOGIST:  [name].   HISTORY OF PRESENT ILLNESS:  A 70 year old woman apparently with a  history of coronary disease and congestive heart failure, now presenting  with progressive dyspnea.  [name] reports admission in 2002 to  [name] probably with congestive heart failure.  She  underwent a percutaneous intervention for coronary disease.  Those  records are currently requested, but are not yet available for review.  She was apparently lost to followup and has done well from a cardiac  standpoint.  She now presents with 3 days of dyspnea.  History is  difficult to obtain due to the patient's profound deafness.  There is no  history of chest discomfort.  Nothing available to the patient improved  her symptoms.  She had an associated nonproductive cough.  She was worse  with mild exertion.  Since evaluation and treatment in the emergency  department, she is substantially improved.   PAST MEDICAL HISTORY:  Otherwise notable for carcinoma of the breast for  which a left mastectomy was performed in 1992.  She has been followed by  Dr. Tressie Stalker in recent years apparently without evidence for  recurrence.  She underwent an uncomplicated right total knee  arthroplasty in April 2006.  She has a history of hypertension that has  been adequately treated.   SOCIAL HISTORY:  No excessive use of alcohol; modest ongoing cigarette  smoking.   FAMILY HISTORY:  No prominent coronary disease.   IMMUNIZATIONS:  Influenza vaccinations are up-to-date.  She is unclear  as  to whether she received pneumococcal vaccine.   REVIEW OF SYSTEMS:  She has some arthritic discomfort of the joints in  the lower extremities.  She recently underwent colonoscopy with excision  of a polyp.  She follows a low-salt diet.  She requires corrective  lenses for near vision.  She has severe hearing loss as well as both  upper and lower dentures.  She experiences middle of the night awakening  and uses hypnotics.   ADMISSION MEDICATIONS:  1. Amlodipine 10 mg daily.  2. Fexofenadine 180 mg daily.  3. HCTZ 12.5 mg daily.  4. Metoprolol 50 mg b.i.d.  5. Aspirin 325 mg daily.   PHYSICAL EXAMINATION:  GENERAL:  Pleasant woman who is hard of hearing  and thus difficult to communicate with in no acute distress.  VITAL SIGNS:  Temperature is 99.2, heart rate 82 and regular,  respirations 20, blood pressure 150/60, weight 75.5 kg.  HEENT:  EOMs full; normal lids and conjunctivae; normal oral mucosa.  NECK:  Mild jugular venous distention; normal carotid upstrokes without  bruits.  ENDOCRINE:  No thyromegaly.  HEMATOPOIETIC:  No adenopathy.  SKIN:  No significant lesions.  LUNGS:  Prolonged expiratory phase; expiratory rhonchi; few rales.  CARDIAC:  Normal first and second heart sounds; fourth heart sound  present.  ABDOMEN:  Soft and nontender; no organomegaly; no masses; no  bruits.  EXTREMITIES:  Trace edema; distal pulses diminished.  NEUROLOGIC:  Symmetric strength and tone; normal cranial nerves.   DIAGNOSTICS:  1. Chest x-ray:  Mild-moderate pulmonary edema, more prominent in the      right lower lung field.  2. EKG:  Pending.   LABORATORY DATA:  Other laboratories notable for potassium of 2.8,  hemoglobin 11.7 with a normal MCV and a BNP level of 1060.  BUN and  creatinine are normal.   IMPRESSION:  A 70 year old woman with a prior cardiac history, perhaps  including congestive heart failure, but no recent care from a  cardiologist.  Left ventricular systolic  function is unknown.  She has  responded well to initial therapy with diuretics which will be  continued.  An echocardiogram and EKG are pending.  Serial cardiac  markers to rule out myocardial infarction have been requested.  We will  obtain a TSH level as well.  A replacement for hypokalemia has been  ordered.  We appreciate the request for consultation and will be happy  to follow this nice woman with you.      Cristopher Estimable. Lattie Haw, MD, [facility]  Electronically Signed     RMR/MEDQ  D:  07/11/2007  T:  07/12/2007  Job:  [id number]

## 2010-09-05 NOTE — Assessment & Plan Note (Signed)
[facility] OFFICE NOTE   REIGHLYNN, DEFREECE                        MRN:          [id number]  DATE:04/14/2008                            DOB:          [birth year]    CARDIOLOGIST:  Satira Sark, MD   PRIMARY CARE PHYSICIAN:  Norwood Levo. Moshe Cipro, MD   REASON FOR VISIT:  One week followup.   HISTORY OF PRESENT ILLNESS:  [name] is a 39-year female patient, who  has a history of ischemic cardiomyopathy with an EF of 30-35% that has  improved with evidence of just mildly depressed LV function by recent  echocardiogram whom I saw on April 08, 2008 with complaints of  shortness of breath.  She seemed to be in acute-on-chronic systolic  congestive heart failure.  We adjusted her Lasix and checked blood work  and had her followup today.  Her blood work demonstrated a BNP of 523  and her chest x-ray did demonstrate interstitial edema consistent with  congestive heart failure.  Other blood work included a CBC.  Her  hemoglobin was low at 10.6, MCV 85.4, platelet count 277,000, sodium  143, potassium 3.2, and creatinine 0.79.  In the office today, she notes  she is doing much better.  She describes NYHA class II to class IIB  symptoms.  She denies orthopnea or PND.  She denies pedal edema.  She  denies chest pain.  She denies palpitations or syncope.  Her weight is  down 3 pounds since her last visit.   CURRENT MEDICATIONS:  Lovastatin 20 mg daily, Lisinopril 40 mg daily,  Spirolactone 25 mg daily, Aspirin 81 mg daily, Niaspan ER 500 mg daily,  Carvedilol 12.5 mg b.i.d., Furosemide 40 mg b.i.d., Alprazolam p.r.n.,  Stool softener.   PHYSICAL EXAMINATION:  GENERAL:  She is a well-nourished, well-developed  female.  VITAL SIGNS:  Blood pressure is 140/64, pulse 68, and weight 159 pounds.  HEENT:  Normal.  NECK:  Without JVD.  CARDIAC:  Normal S1 and S2.  Regular rate and rhythm.  Questionable S3.  LUNGS:  Clear to auscultation bilaterally.  No wheezing, rhonchi, or  rales.  ABDOMEN:  Soft and nontender.  EXTREMITIES:  No edema.  NEUROLOGIC:  She is alert and oriented x3.  Cranial nerves II to XII  grossly intact.   ASSESSMENT AND PLAN:  1. Acute-on-chronic systolic congestive heart failure.  The patient      has an ischemic cardiomyopathy with an EF of 30-35%, but improved      left ventricular function by most recent echocardiogram.  She is      doing much better on her increased Lasix.  Her chest x-ray did      demonstrate edema and her BNP was mildly-to-moderately elevated.      Given her weight loss and improved exam, I think she can continue      on Lasix 40 mg b.i.d.  Her potassium is somewhat low, but she is on      spironolactone.  I have asked her to increase her dietary potassium  for now and I will recheck a BMET on Monday of next week, April 19, 2008.  We will also check a BNP at that time.  The patient's      blood pressure can certainly tolerate an increase her carvedilol.      I have asked her to increase her carvedilol to 12.5 mg one-and-half      tablets tablet b.i.d.  2. Normocytic anemia.  The patient denies any history of bleeding      problems.  We will obtain iron, TIBC, ferritin, B12, folate levels,      and stools for Hemoccult to further assess her anemia.  I have      asked her to set up a followup appointment with her primary care      physician.  We will send all the above data to her for review.  She      knows that she needs further evaluation for anemia.  I question      whether this may have contributed to her acute heart failure.  3. Hypertension.  This is better controlled, but we will increase her      carvedilol as outlined above.  4. Dyslipidemia.  She continues on lovastatin and Niaspan.  5. Coronary artery disease as outlined in previous notes.  She has      apparently had angioplasty in the distant past.  She is not       describing any anginal symptoms at this time.   DISPOSITION:  Follow up with Dr. Domenic Polite in 1 month or sooner p.r.n.  She is to follow up with her primary care physician for evaluation of  anemia in the next 1-2 weeks.      Richardson Dopp, PA-C  Electronically Signed      Marijo Conception. Verl Blalock, MD, [facility]  Electronically Signed   SW/MedQ  DD: 04/14/2008  DT: 04/14/2008  Job #: [id number]   cc:   Norwood Levo. Moshe Cipro, M.D.

## 2010-09-08 ENCOUNTER — Other Ambulatory Visit: Payer: Self-pay | Admitting: Gastroenterology

## 2010-09-08 NOTE — Op Note (Signed)
[name], ALLEGRA                 ACCOUNT NO.:  [id number]   MEDICAL RECORD NO.:  [id number]          PATIENT TYPE:  INP   LOCATION:  [id number]                          FACILITY:  [facility]   PHYSICIAN:  Carole Civil, M.D.DATE OF BIRTH:  22-Dec-1940   DATE OF PROCEDURE:  08/08/2004  DATE OF DISCHARGE:  08/12/2004                                 OPERATIVE REPORT   PREOPERATIVE DIAGNOSIS:  Osteoarthritis of the right knee.   POSTOPERATIVE DIAGNOSIS:  Osteoarthritis of the right knee.   PROCEDURES:  Right total knee replacement.   SURGEON:  Carole Civil, M.D.   ANESTHESIA:  Spinal anesthetic.   The patient was evaluated in the preop holding area. History and physical  was updated and the surgical site was signed by her and then countersigned  by the surgeon. She got her preoperative antibiotics was taken to the  operating room. She received a successful spinal anesthetic. Her right leg  was prepped and draped with the usual sterile technique.   After exsanguination of the limb, tourniquet was elevated at 300 mmHg and we  made an incision over the right knee centered over the patella. This  incision was extended proximally and distally and carried down to the  extensor mechanism. The medial arthrotomy was then performed. Soft tissue  structures in the knee, including the ACL, PCL and medial and lateral  menisci were removed. The patellofemoral ligament was released. The patella  was everted. The tibia was subluxated forward and the tibial guide was set  for a neutral cut. We removed 10 mm of bone from the good lateral side as  a reference. We sized the tibia to a 2.5 and then addressed the femur.   A drill bit was used to enter the canal. The canal was decompressed. The  distal cutting block was set for 9. We actually took 11 mm of bone after  assuring a flat cut. We sized the femur to a size 3, then placed a 4-in-1  cutting block and made our four distal additional cuts. We  checked flexion  and extension gaps. I was happy with the flexion and extension gap with a  size 10 insert.   We removed bone from the posterior condyles, made our box cut in the femur  and did a trial reduction. I was happy with a 3 femur, 2.5 tibia and a 12.5  mm insert.   We addressed the patella next. The patella was sized to a size 35. We made  our cut down to 11 mm of bone and made our three peg holes.   We then irrigated the knee, removed all bony debris, dried the surfaces,  mixed our cement, cemented the components in place and let them cure. We  removed excess cement, placed our 12.5 mm insert and then started closure.   Range of motion was very good, approximately 110 degrees of flexion with  full extension.   We placed 30 mL of Marcaine in the joint. A Stryker pain pump catheter went  in the joint. A subcutaneous drain was placed in the subcutaneous  tissue.  The extensor mechanism was closed with interrupted Bralon and subcutaneous  tissue with 0 Monocryl and 2-0 Monocryl. Skin staples were used to  reapproximate the skin. Sterile bandages and Cryo/Cuff were applied and the  patient was taken recovery room in stable condition.      SEH/MEDQ  D:  09/17/2004  T:  09/17/2004  Job:  [id number]

## 2010-09-13 ENCOUNTER — Ambulatory Visit: Payer: MEDICARE | Admitting: Physician Assistant

## 2010-09-13 ENCOUNTER — Ambulatory Visit: Payer: MEDICARE | Admitting: Adult Health

## 2010-09-15 ENCOUNTER — Ambulatory Visit (INDEPENDENT_AMBULATORY_CARE_PROVIDER_SITE_OTHER): Payer: Medicare Other | Admitting: Adult Health

## 2010-09-15 ENCOUNTER — Encounter: Payer: Self-pay | Admitting: Adult Health

## 2010-09-15 DIAGNOSIS — I2510 Atherosclerotic heart disease of native coronary artery without angina pectoris: Secondary | ICD-10-CM

## 2010-09-15 DIAGNOSIS — I5022 Chronic systolic (congestive) heart failure: Secondary | ICD-10-CM

## 2010-09-15 NOTE — Progress Notes (Signed)
HPI  No Known Allergies  Current Outpatient Prescriptions  Medication Sig Dispense Refill  . acetaminophen (TYLENOL) 325 MG tablet Take 650 mg by mouth every 6 (six) hours as needed.        . ALPRAZolam (XANAX) 0.25 MG tablet Take 0.25 mg by mouth at bedtime as needed.        [name] aspirin 81 MG tablet Take 81 mg by mouth daily.        . carvedilol (COREG) 25 MG tablet Take 25 mg by mouth 2 (two) times daily with a meal.        . furosemide (LASIX) 40 MG tablet Take 40 mg by mouth 2 (two) times daily.        [name] lisinopril (PRINIVIL,ZESTRIL) 40 MG tablet Take 40 mg by mouth daily.        [name] lovastatin (MEVACOR) 20 MG tablet Take 20 mg by mouth at bedtime.        . metoprolol (LOPRESSOR) 50 MG tablet Take 50 mg by mouth 2 (two) times daily.        . prednisoLONE acetate (PRED FORTE) 1 % ophthalmic suspension       . spironolactone (ALDACTONE) 25 MG tablet Take 25 mg by mouth 2 (two) times daily.         Past Medical History  Diagnosis Date  . CAD (coronary artery disease)     reported remote PTCA  . Cardiomyopathy     ischemic LVEF 40-45%  . Anemia   . Hyperlipidemia   . Breast cancer   . Arthritis     Past Surgical History  Procedure Date  . Mastectomy 1998  . Total knee arthroplasty     right Dr.Harrison    ROS: PHYSICAL EXAM BP 119/60  Pulse 77  Ht 5\' 3"  (1.6 m)  Wt 153 lb (69.4 kg)  BMI 27.10 kg/m2  SpO2 96%  EKG:  ASSESSMENT AND PLAN HPI: [name] is a 70 y/o AAF we are following for continued assessment and treatment of systolic CHF, ischemic cardiomyopathy, hypertension.  She was recently admitted to [facility] with episode of A/C CHF and pneumonia. We saw her on consult and assisted with diuretic management.  She has a history of medical noncompliance and dietary noncompliance leading to admissions.  She was sent home on spironolactone, Lasix 40mg  BID, and carvedilol.  She states she is compliant with her medications, but not always complaint with diet.  She comes today  for follow-up after medication adjustments. She brings with her a list of her weights and is also tearful when she discusses that she lost her lisinopril after buying it, by accidentally throwing it away.  She has been taking metoprolol intstead in addition to her coreg, but has been taking all of her other medications as directed.  She denies complaints of chest pain, fluid retention or DOE.      No Known Allergies  Current Outpatient Prescriptions  Medication Sig Dispense Refill  . acetaminophen (TYLENOL) 325 MG tablet Take 650 mg by mouth every 6 (six) hours as needed.        . ALPRAZolam (XANAX) 0.25 MG tablet Take 0.25 mg by mouth at bedtime as needed.        [name] aspirin 81 MG tablet Take 81 mg by mouth daily.        . carvedilol (COREG) 25 MG tablet Take 25 mg by mouth 2 (two) times daily with a meal.        . furosemide (  LASIX) 40 MG tablet Take 40 mg by mouth 2 (two) times daily.        [name] lisinopril (PRINIVIL,ZESTRIL) 40 MG tablet Take 40 mg by mouth daily.        [name] lovastatin (MEVACOR) 20 MG tablet Take 20 mg by mouth at bedtime.        . metoprolol (LOPRESSOR) 50 MG tablet Take 50 mg by mouth 2 (two) times daily.        . prednisoLONE acetate (PRED FORTE) 1 % ophthalmic suspension       . spironolactone (ALDACTONE) 25 MG tablet Take 25 mg by mouth 2 (two) times daily.         Past Medical History  Diagnosis Date  . CAD (coronary artery disease)     reported remote PTCA  . Cardiomyopathy     ischemic LVEF 40-45%  . Anemia   . Hyperlipidemia   . Breast cancer   . Arthritis     Past Surgical History  Procedure Date  . Mastectomy 1998  . Total knee arthroplasty     right Dr.Harrison    [id number] of systems complete and found to be negative unless listed above PHYSICAL EXAM BP 119/60  Pulse 77  Ht 5\' 3"  (1.6 m)  Wt 153 lb (69.4 kg)  BMI 27.10 kg/m2  SpO2 96% General: Well developed, well nourished, in no acute distress Head: Eyes PERRLA, No xanthomas.   Normal  cephalic and atramatic  Lungs: Clear bilaterally to auscultation and percussion. Heart: HRRR S1 S2,  Pulses are 2+ & equal.            No carotid bruit. No JVD.  No abdominal bruits. No femoral bruits. Abdomen: Bowel sounds are positive, abdomen soft and non-tender, mild distention Msk:  Back normal, normal gait. Normal strength and tone for age. Extremities: No clubbing, cyanosis or edema.  DP +1 Neuro: Alert and oriented X 3. Psych:  Good affect, responds appropriately, some tearfulness when she discusses losing medications.  Assessment and Plan:

## 2010-09-15 NOTE — Patient Instructions (Signed)
Your physician recommends that you schedule a follow-up appointment in:3 months Take benicar 20mg  daily until you are able to replace your lisinopril then stop taking the benicar

## 2010-09-15 NOTE — Assessment & Plan Note (Signed)
Stable and asymptomatic. Will continue current medications without changes.

## 2010-09-15 NOTE — Assessment & Plan Note (Signed)
She is stable from CV standpoint. Wt has been stable, she is asymptomatic.  I have advised her to stop taking the metoprolol in addition to coreg dose.  She is given a couple of weeks of benicar 20mg  daily to help her until she can afford lisinopril dose at the first of the month.  She is to continue to weigh herself daily and avoid salt.  Will see her again in 3 months unless symptomatic.

## 2010-09-17 ENCOUNTER — Other Ambulatory Visit: Payer: Self-pay | Admitting: Cardiology

## 2010-11-27 ENCOUNTER — Other Ambulatory Visit: Payer: Self-pay | Admitting: Cardiology

## 2011-01-15 LAB — B-NATRIURETIC PEPTIDE (CONVERTED LAB)
Pro B Natriuretic peptide (BNP): 1060 — ABNORMAL HIGH
Pro B Natriuretic peptide (BNP): 1150 — ABNORMAL HIGH

## 2011-01-15 LAB — CARDIAC PANEL(CRET KIN+CKTOT+MB+TROPI)
CK, MB: 2.7
CK, MB: 3.3
Relative Index: 1.7
Total CK: 186 — ABNORMAL HIGH
Total CK: 200 — ABNORMAL HIGH

## 2011-01-15 LAB — BASIC METABOLIC PANEL
BUN: 10
BUN: 20
CO2: 25
CO2: 26
CO2: 27
Calcium: 8.5
Calcium: 8.6
Calcium: 8.8
Calcium: 8.9
Chloride: 104
Creatinine, Ser: 1.03
GFR calc Af Amer: >60
GFR calc Af Amer: >60
GFR calc non Af Amer: 53 — ABNORMAL LOW
GFR calc non Af Amer: >60
GFR calc non Af Amer: >60
Glucose, Bld: 113 — ABNORMAL HIGH
Glucose, Bld: 131 — ABNORMAL HIGH
Glucose, Bld: 137 — ABNORMAL HIGH
Glucose, Bld: 98
Potassium: 3.8
Sodium: 135
Sodium: 137
Sodium: 139
Sodium: 139

## 2011-01-15 LAB — POCT CARDIAC MARKERS
CKMB, poc: 4.6
Myoglobin, poc: 90
Operator id: 213671
Troponin i, poc: <0.05

## 2011-01-15 LAB — CBC
Platelets: 164
RDW: 16.5 — ABNORMAL HIGH
WBC: 5.3

## 2011-01-15 LAB — DIFFERENTIAL
Basophils Absolute: 0
Lymphocytes Relative: 23
Lymphs Abs: 1.2
Neutro Abs: 3.2
Neutrophils Relative %: 62

## 2011-01-15 LAB — LIPID PANEL
LDL Cholesterol: 77
Total CHOL/HDL Ratio: 6.2
Triglycerides: 80
VLDL: 16

## 2011-01-22 ENCOUNTER — Other Ambulatory Visit (HOSPITAL_COMMUNITY): Payer: Self-pay | Admitting: Internal Medicine

## 2011-01-22 DIAGNOSIS — Z139 Encounter for screening, unspecified: Secondary | ICD-10-CM

## 2011-02-19 ENCOUNTER — Ambulatory Visit (HOSPITAL_COMMUNITY)
Admission: RE | Admit: 2011-02-19 | Discharge: 2011-02-19 | Disposition: A | Discharge status: Home / Self Care | Payer: Medicare Other | Source: Ambulatory Visit | Attending: Internal Medicine | Admitting: Internal Medicine

## 2011-02-19 DIAGNOSIS — Z1231 Encounter for screening mammogram for malignant neoplasm of breast: Secondary | ICD-10-CM | POA: Insufficient documentation

## 2011-02-19 DIAGNOSIS — Z139 Encounter for screening, unspecified: Secondary | ICD-10-CM

## 2011-02-23 ENCOUNTER — Telehealth: Payer: Self-pay | Admitting: *Deleted

## 2011-02-23 MED ORDER — SPIRONOLACTONE 25 MG PO TABS: 25.0000 mg | ORAL_TABLET | Freq: Every day | ORAL | Status: DC

## 2011-02-23 NOTE — Telephone Encounter (Signed)
Called pt to verify RX for spironolactone. We received a rx from pharmacy stating 1 po daily and the patients chart states 2 po daily. Called pt with no answer

## 2011-02-26 MED ORDER — SPIRONOLACTONE 25 MG PO TABS: 25.0000 mg | ORAL_TABLET | Freq: Every day | ORAL | Status: DC

## 2011-03-20 ENCOUNTER — Other Ambulatory Visit: Payer: Self-pay | Admitting: *Deleted

## 2011-03-20 MED ORDER — SPIRONOLACTONE 25 MG PO TABS: 25.0000 mg | ORAL_TABLET | Freq: Every day | ORAL | Status: DC

## 2011-03-25 ENCOUNTER — Emergency Department (HOSPITAL_COMMUNITY): Payer: Medicare Other

## 2011-03-25 ENCOUNTER — Emergency Department (HOSPITAL_COMMUNITY)
Admission: EM | Admit: 2011-03-25 | Discharge: 2011-03-25 | Disposition: A | Discharge status: Home / Self Care | Payer: Medicare Other | Attending: Emergency Medicine | Admitting: Emergency Medicine

## 2011-03-25 ENCOUNTER — Encounter (HOSPITAL_COMMUNITY): Payer: Self-pay | Admitting: *Deleted

## 2011-03-25 DIAGNOSIS — J40 Bronchitis, not specified as acute or chronic: Secondary | ICD-10-CM | POA: Insufficient documentation

## 2011-03-25 DIAGNOSIS — J069 Acute upper respiratory infection, unspecified: Secondary | ICD-10-CM | POA: Insufficient documentation

## 2011-03-25 DIAGNOSIS — M129 Arthropathy, unspecified: Secondary | ICD-10-CM | POA: Insufficient documentation

## 2011-03-25 DIAGNOSIS — Z9010 Acquired absence of unspecified breast and nipple: Secondary | ICD-10-CM | POA: Insufficient documentation

## 2011-03-25 DIAGNOSIS — I428 Other cardiomyopathies: Secondary | ICD-10-CM | POA: Insufficient documentation

## 2011-03-25 DIAGNOSIS — Z853 Personal history of malignant neoplasm of breast: Secondary | ICD-10-CM | POA: Insufficient documentation

## 2011-03-25 DIAGNOSIS — I2510 Atherosclerotic heart disease of native coronary artery without angina pectoris: Secondary | ICD-10-CM | POA: Insufficient documentation

## 2011-03-25 DIAGNOSIS — Z87891 Personal history of nicotine dependence: Secondary | ICD-10-CM | POA: Insufficient documentation

## 2011-03-25 DIAGNOSIS — E785 Hyperlipidemia, unspecified: Secondary | ICD-10-CM | POA: Insufficient documentation

## 2011-03-25 DIAGNOSIS — Z862 Personal history of diseases of the blood and blood-forming organs and certain disorders involving the immune mechanism: Secondary | ICD-10-CM | POA: Insufficient documentation

## 2011-03-25 MED ORDER — CEPHALEXIN 500 MG PO CAPS: 500.0000 mg | ORAL_CAPSULE | Freq: Four times a day (QID) | ORAL | Status: AC

## 2011-03-25 MED ORDER — PROMETHAZINE-CODEINE 6.25-10 MG/5ML PO SYRP: 5.0000 mL | ORAL_SOLUTION | ORAL | Status: AC | PRN

## 2011-03-25 MED ORDER — PREDNISONE 10 MG PO TABS: ORAL_TABLET | ORAL | Status: DC

## 2011-03-25 NOTE — ED Provider Notes (Addendum)
History     CSN: [id number] Arrival date & time: 03/25/2011  1:08 PM   First MD Initiated Contact with Patient 03/25/11 1505      Chief Complaint  Patient presents with  . Cough  . URI    (Consider location/radiation/quality/duration/timing/severity/associated sxs/prior treatment) HPI Comments: Patient gives history of 2-3 days of cough congestion body aches and generally not feeling well. There's been no hemoptysis. No vomiting or diarrhea. Patient complains of feeling weak. Poor appetite has been a problem. She states she has been drinking fairly well.  The history is provided by the patient.    Past Medical History  Diagnosis Date  . CAD (coronary artery disease)     reported remote PTCA  . Cardiomyopathy     ischemic LVEF 40-45%  . Anemia   . Hyperlipidemia   . Breast cancer   . Arthritis     Past Surgical History  Procedure Date  . Mastectomy 1998  . Total knee arthroplasty     right Dr.Harrison    History reviewed. No pertinent family history.  History  Substance Use Topics  . Smoking status: Former Research scientist (life sciences)  . Smokeless tobacco: Never Used  . Alcohol Use: No    OB History    Grav Para Term Preterm Abortions TAB SAB Ect Mult Living                  Review of Systems  Constitutional: Positive for chills and fatigue. Negative for activity change.       All ROS Neg except as noted in HPI  HENT: Positive for congestion and postnasal drip. Negative for nosebleeds and neck pain.   Eyes: Negative for photophobia and discharge.  Respiratory: Positive for cough. Negative for shortness of breath and wheezing.   Cardiovascular: Negative for chest pain and palpitations.  Gastrointestinal: Negative for abdominal pain and blood in stool.  Genitourinary: Negative for dysuria, frequency and hematuria.  Musculoskeletal: Positive for myalgias. Negative for back pain and arthralgias.  Skin: Negative.   Neurological: Positive for headaches. Negative for dizziness,  seizures and speech difficulty.  Psychiatric/Behavioral: Negative for hallucinations and confusion.    Allergies  Review of patient's allergies indicates no known allergies.  Home Medications   Current Outpatient Rx  Name Route Sig Dispense Refill  . ACETAMINOPHEN 325 MG PO TABS Oral Take 650 mg by mouth every 6 (six) hours as needed. For pain    . ALPRAZOLAM 0.25 MG PO TABS Oral Take 0.25 mg by mouth at bedtime as needed. For nerves/sleep    . VITAMIN C 500 MG PO CHEW Oral Chew 1 tablet by mouth daily.      . ASPIRIN EC 81 MG PO TBEC Oral Take 81 mg by mouth daily.      [name] CARVEDILOL 25 MG PO TABS Oral Take 25 mg by mouth 2 (two) times daily with a meal.      . DEXTROMETHORPHAN-GUAIFENESIN 10-200 MG/5ML PO LIQD Oral Take 5-10 mLs by mouth as needed. For cold symptoms     . FUROSEMIDE 40 MG PO TABS  TAKE 1 TABLET BY MOUTH TWICE A DAY 60 tablet 5  . IBUPROFEN 200 MG PO TABS Oral Take 200 mg by mouth daily as needed. For pain     . LISINOPRIL 40 MG PO TABS Oral Take 40 mg by mouth daily.      [name] LOVASTATIN 20 MG PO TABS Oral Take 20 mg by mouth at bedtime.      . OMEPRAZOLE  20 MG PO CPDR Oral Take 20 mg by mouth daily as needed. For gas/heartburn     . PREDNISOLONE ACETATE 1 % OP SUSP Both Eyes Place 1 drop into both eyes 4 (four) times daily.     . CEPHALEXIN 500 MG PO CAPS Oral Take 1 capsule (500 mg total) by mouth 4 (four) times daily. 20 capsule 0  . METOPROLOL TARTRATE 50 MG PO TABS Oral Take 50 mg by mouth 2 (two) times daily.      [name] PREDNISONE 10 MG PO TABS  5,4,3,2,1 - Take with food 15 tablet 0  . PROMETHAZINE-CODEINE 6.25-10 MG/5ML PO SYRP Oral Take 5 mLs by mouth every 4 (four) hours as needed for cough. 120 mL 0  . SPIRONOLACTONE 25 MG PO TABS Oral Take 1 tablet (25 mg total) by mouth daily. 30 tablet 12    BP 175/91  Pulse 91  Temp(Src) 98.8 F (37.1 C) (Oral)  Resp 18  Ht 5\' 4"  (1.626 m)  Wt 150 lb (68.04 kg)  BMI 25.75 kg/m2  SpO2 100%  Physical Exam  Nursing note  and vitals reviewed. Constitutional: She is oriented to person, place, and time. She appears well-developed and well-nourished.  Non-toxic appearance.  HENT:  Head: Normocephalic.  Right Ear: Tympanic membrane and external ear normal.  Left Ear: Tympanic membrane and external ear normal.       Mod nasal congestion.  Eyes: EOM and lids are normal. Pupils are equal, round, and reactive to light.  Neck: Normal range of motion. Neck supple. Carotid bruit is not present.  Cardiovascular: Normal rate, regular rhythm, normal heart sounds, intact distal pulses and normal pulses.   Pulmonary/Chest: Breath sounds normal. No respiratory distress. She has no wheezes. She has no rales.       Few scattered rhonchi. Course breath sounds.  Abdominal: Soft. Bowel sounds are normal. There is no tenderness. There is no guarding.  Musculoskeletal: Normal range of motion.  Lymphadenopathy:       Head (right side): No submandibular adenopathy present.       Head (left side): No submandibular adenopathy present.    She has no cervical adenopathy.  Neurological: She is alert and oriented to person, place, and time. She has normal strength. No cranial nerve deficit or sensory deficit.  Skin: Skin is warm and dry.  Psychiatric: She has a normal mood and affect. Her speech is normal.    ED Course  Procedures (including critical care time)  Labs Reviewed - No data to display Dg Chest 2 View  03/25/2011  *RADIOLOGY REPORT*  Clinical Data: Cough.  CHEST - 2 VIEW  Comparison: 07/19/2010  Findings: Cardiomegaly.  Peribronchial thickening.  Bibasilar densities, likely scarring.  Small bilateral effusions.  No acute bony abnormality.  IMPRESSION: Cardiomegaly.  Probable chronic bronchitic changes.  Bibasilar scarring and small effusions.  Original Report Authenticated By: Raelyn Number, M.D.     1. Bronchitis   2. URI (upper respiratory infection)       I have reviewed nursing notes, vital signs, and all  appropriate lab and imaging results for this patient. Chest xray neg for pneumonia. Safe for pt to be seen as out pt for follow up.        Lenox Ahr, Morning Sun 03/26/11 [place], [place] 05/08/11 [phone]

## 2011-03-25 NOTE — ED Notes (Signed)
Pt presents with cough, headache, and chest congestion since Friday. Pt denies fever. Pt states she has had pneumonia twice and wanted to be checked to make sure that she did not have it now. NAD at this time. Resp even and unlabored.

## 2011-03-25 NOTE — ED Notes (Signed)
Pt a/ox4. Resp even and unlabored. NAD at this time. D/C instructions and Rx x3 reviewed with pt. Pt verbalized understanding. Pt ambulated to lobby with steady gate.

## 2011-03-25 NOTE — ED Notes (Signed)
Pt c/o cough and cold sx x 2 days

## 2011-04-24 NOTE — ED Provider Notes (Signed)
Medical screening examination/treatment/procedure(s) were performed by non-physician practitioner and as supervising physician I was immediately available for consultation/collaboration.  Richarda Blade, MD 04/24/11 [phone]

## 2011-04-27 DIAGNOSIS — Z0389 Encounter for observation for other suspected diseases and conditions ruled out: Secondary | ICD-10-CM | POA: Insufficient documentation

## 2011-05-08 NOTE — ED Provider Notes (Signed)
Medical screening examination/treatment/procedure(s) were performed by non-physician practitioner and as supervising physician I was immediately available for consultation/collaboration.  Richarda Blade, MD 05/08/11 1515

## 2011-05-15 NOTE — ED Notes (Deleted)
LWBS 

## 2011-06-24 ENCOUNTER — Other Ambulatory Visit: Payer: Self-pay | Admitting: Cardiology

## 2011-07-06 ENCOUNTER — Ambulatory Visit (HOSPITAL_COMMUNITY)
Admission: RE | Admit: 2011-07-06 | Discharge: 2011-07-06 | Disposition: A | Discharge status: Home / Self Care | Payer: Medicare Other | Source: Ambulatory Visit | Attending: Internal Medicine | Admitting: Internal Medicine

## 2011-07-06 ENCOUNTER — Other Ambulatory Visit (HOSPITAL_COMMUNITY): Payer: Self-pay | Admitting: Internal Medicine

## 2011-07-06 DIAGNOSIS — J40 Bronchitis, not specified as acute or chronic: Secondary | ICD-10-CM | POA: Insufficient documentation

## 2011-07-06 DIAGNOSIS — J90 Pleural effusion, not elsewhere classified: Secondary | ICD-10-CM | POA: Insufficient documentation

## 2011-07-06 DIAGNOSIS — R05 Cough: Secondary | ICD-10-CM | POA: Insufficient documentation

## 2011-07-06 DIAGNOSIS — Z9010 Acquired absence of unspecified breast and nipple: Secondary | ICD-10-CM | POA: Insufficient documentation

## 2011-07-06 DIAGNOSIS — I517 Cardiomegaly: Secondary | ICD-10-CM | POA: Insufficient documentation

## 2011-07-06 DIAGNOSIS — R059 Cough, unspecified: Secondary | ICD-10-CM | POA: Insufficient documentation

## 2011-07-06 DIAGNOSIS — R0989 Other specified symptoms and signs involving the circulatory and respiratory systems: Secondary | ICD-10-CM | POA: Insufficient documentation

## 2011-07-06 DIAGNOSIS — Z853 Personal history of malignant neoplasm of breast: Secondary | ICD-10-CM | POA: Insufficient documentation

## 2011-09-26 ENCOUNTER — Other Ambulatory Visit (HOSPITAL_COMMUNITY): Payer: Self-pay | Admitting: "Endocrinology

## 2011-09-26 ENCOUNTER — Ambulatory Visit (HOSPITAL_COMMUNITY)
Admission: RE | Admit: 2011-09-26 | Discharge: 2011-09-26 | Disposition: A | Discharge status: Home / Self Care | Payer: Medicare Other | Source: Ambulatory Visit | Attending: "Endocrinology | Admitting: "Endocrinology

## 2011-09-26 DIAGNOSIS — R059 Cough, unspecified: Secondary | ICD-10-CM | POA: Insufficient documentation

## 2011-09-26 DIAGNOSIS — I428 Other cardiomyopathies: Secondary | ICD-10-CM | POA: Insufficient documentation

## 2011-09-26 DIAGNOSIS — I509 Heart failure, unspecified: Secondary | ICD-10-CM

## 2011-09-26 DIAGNOSIS — R0989 Other specified symptoms and signs involving the circulatory and respiratory systems: Secondary | ICD-10-CM

## 2011-09-26 DIAGNOSIS — E785 Hyperlipidemia, unspecified: Secondary | ICD-10-CM | POA: Insufficient documentation

## 2011-09-26 DIAGNOSIS — R05 Cough: Secondary | ICD-10-CM | POA: Insufficient documentation

## 2011-09-26 DIAGNOSIS — Z853 Personal history of malignant neoplasm of breast: Secondary | ICD-10-CM | POA: Insufficient documentation

## 2011-12-25 ENCOUNTER — Other Ambulatory Visit: Payer: Self-pay | Admitting: Cardiology

## 2012-01-23 ENCOUNTER — Other Ambulatory Visit (HOSPITAL_COMMUNITY): Payer: Self-pay | Admitting: Internal Medicine

## 2012-01-23 DIAGNOSIS — Z139 Encounter for screening, unspecified: Secondary | ICD-10-CM

## 2012-02-21 ENCOUNTER — Ambulatory Visit (HOSPITAL_COMMUNITY)
Admission: RE | Admit: 2012-02-21 | Discharge: 2012-02-21 | Disposition: A | Discharge status: Home / Self Care | Payer: Medicare Other | Source: Ambulatory Visit | Attending: Internal Medicine | Admitting: Internal Medicine

## 2012-02-21 DIAGNOSIS — Z1231 Encounter for screening mammogram for malignant neoplasm of breast: Secondary | ICD-10-CM | POA: Insufficient documentation

## 2012-02-21 DIAGNOSIS — Z139 Encounter for screening, unspecified: Secondary | ICD-10-CM

## 2012-03-20 ENCOUNTER — Other Ambulatory Visit: Payer: Self-pay | Admitting: Cardiology

## 2012-03-26 ENCOUNTER — Other Ambulatory Visit: Payer: Self-pay | Admitting: Cardiology

## 2012-05-20 ENCOUNTER — Encounter: Payer: Self-pay | Admitting: Adult Health

## 2012-05-20 ENCOUNTER — Ambulatory Visit (INDEPENDENT_AMBULATORY_CARE_PROVIDER_SITE_OTHER): Payer: No Typology Code available for payment source | Admitting: Adult Health

## 2012-05-20 VITALS — BP 140/74 | HR 69 | Ht 64.0 in | Wt 153.0 lb

## 2012-05-20 DIAGNOSIS — I519 Heart disease, unspecified: Secondary | ICD-10-CM

## 2012-05-20 DIAGNOSIS — I2510 Atherosclerotic heart disease of native coronary artery without angina pectoris: Secondary | ICD-10-CM

## 2012-05-20 DIAGNOSIS — I10 Essential (primary) hypertension: Secondary | ICD-10-CM

## 2012-05-20 DIAGNOSIS — E785 Hyperlipidemia, unspecified: Secondary | ICD-10-CM

## 2012-05-20 DIAGNOSIS — I5022 Chronic systolic (congestive) heart failure: Secondary | ICD-10-CM

## 2012-05-20 MED ORDER — FUROSEMIDE 40 MG PO TABS: 40.0000 mg | ORAL_TABLET | Freq: Two times a day (BID) | ORAL | Status: DC

## 2012-05-20 MED ORDER — CARVEDILOL 25 MG PO TABS: 25.0000 mg | ORAL_TABLET | Freq: Two times a day (BID) | ORAL | Status: DC

## 2012-05-20 MED ORDER — METOPROLOL TARTRATE 50 MG PO TABS: 50.0000 mg | ORAL_TABLET | Freq: Two times a day (BID) | ORAL | Status: DC

## 2012-05-20 MED ORDER — SPIRONOLACTONE 25 MG PO TABS: 25.0000 mg | ORAL_TABLET | Freq: Every day | ORAL | Status: DC

## 2012-05-20 MED ORDER — LISINOPRIL 40 MG PO TABS: 40.0000 mg | ORAL_TABLET | Freq: Every day | ORAL | Status: DC

## 2012-05-20 NOTE — Assessment & Plan Note (Signed)
She has no cardiac complaint. Will defer further ischemic testing unless she begins to have symptoms. For now will continue risk management strategy.

## 2012-05-20 NOTE — Progress Notes (Deleted)
Name: [name]    DOB: 1940-12-17  Age: 72 y.o.  MR#: [id number]       PCP:  [name], MD      Insurance: @PAYORNAME @   CC:   No chief complaint on file.   VS BP 140/74  Pulse 69  Ht 5\' 4"  (1.626 m)  Wt 153 lb (69.4 kg)  BMI 26.26 kg/m2  Weights Current Weight  05/20/12 153 lb (69.4 kg)  03/25/11 150 lb (68.04 kg)  09/15/10 153 lb (69.4 kg)    Blood Pressure  BP Readings from Last 3 Encounters:  05/20/12 140/74  03/25/11 166/73  09/15/10 119/60     Admit date:  (Not on file) Last encounter with RMR:  Visit date not found   Allergy No Known Allergies  Current Outpatient Prescriptions  Medication Sig Dispense Refill  . acetaminophen (TYLENOL) 325 MG tablet Take 650 mg by mouth every 6 (six) hours as needed. For pain      . aspirin EC 81 MG tablet Take 81 mg by mouth daily.        . carvedilol (COREG) 25 MG tablet Take 25 mg by mouth 2 (two) times daily with a meal.        . furosemide (LASIX) 40 MG tablet TAKE 1 TABLET BY MOUTH TWICE A DAY  60 tablet  5  . ibuprofen (ADVIL,MOTRIN) 200 MG tablet Take 200 mg by mouth daily as needed. For pain       . lisinopril (PRINIVIL,ZESTRIL) 40 MG tablet Take 40 mg by mouth daily.        [name] lovastatin (MEVACOR) 20 MG tablet Take 20 mg by mouth at bedtime.        [name] omeprazole (PRILOSEC) 20 MG capsule Take 20 mg by mouth daily as needed. For gas/heartburn       . prednisoLONE acetate (PRED FORTE) 1 % ophthalmic suspension Place 1 drop into both eyes 4 (four) times daily.       [name] spironolactone (ALDACTONE) 25 MG tablet TAKE 1 TABLET (25 MG TOTAL) BY MOUTH DAILY.  30 tablet  0    Discontinued Meds:    Medications Discontinued During This Encounter  Medication Reason  . spironolactone (ALDACTONE) 25 MG tablet Error  . predniSONE (DELTASONE) 10 MG tablet Error  . ALPRAZolam (XANAX) 0.25 MG tablet Error  . Ascorbic Acid (VITAMIN C) 500 MG CHEW Error  . Dextromethorphan-Guaifenesin ([employer] TUSSIN DM COUGH/CHEST) 10-200 MG/5ML LIQD  Error  . metoprolol (LOPRESSOR) 50 MG tablet Error    Patient Active Problem List  Diagnosis  . HYPERLIPIDEMIA  . CORONARY ATHEROSCLEROSIS NATIVE CORONARY ARTERY  . Chronic systolic heart failure  . LOWER LEG, ARTHRITIS, DEGEN./OSTEO  . HEARTBURN  . TOTAL KNEE FOLLOW-UP    LABS No visits with results within 3 Month(s) from this visit. Latest known visit with results is:  Admission on 07/19/2010, Discharged on 07/24/2010  Component Date Value  . Neutrophils Relative 07/19/2010 84*  . Neutro Abs 07/19/2010 7.2   . Lymphocytes Relative 07/19/2010 7*  . Lymphs Abs 07/19/2010 0.6*  . Monocytes Relative 07/19/2010 9   . Monocytes Absolute 07/19/2010 0.7   . Eosinophils Relative 07/19/2010 0   . Eosinophils Absolute 07/19/2010 0.0   . Basophils Relative 07/19/2010 0   . Basophils Absolute 07/19/2010 0.0   . WBC 07/19/2010 8.5   . RBC 07/19/2010 4.13   . Hemoglobin 07/19/2010 10.7*  . HCT 07/19/2010 33.5*  . MCV 07/19/2010 81.1   . [facility] 07/19/2010  25.9*  . MCHC 07/19/2010 31.9   . RDW 07/19/2010 16.4*  . Platelets 07/19/2010 241   . Myoglobin, poc 07/19/2010 >500*  . CKMB, poc 07/19/2010 1.8   . Troponin i, poc 07/19/2010 <0.05   . Comment 07/19/2010                     Value:                               TROPONIN VALUES IN THE RANGE                         OF 0.00-0.09 ng/mL SHOW                         NO INDICATION OF                         MYOCARDIAL INJURY.                                                        PERSISTENTLY INCREASED TROPONIN                         VALUES IN THE RANGE OF 0.10-0.24                         ng/mL CAN BE SEEN IN:                               -UNSTABLE ANGINA                               -CONGESTIVE HEART FAILURE                               -MYOCARDITIS                               -CHEST TRAUMA                               -ARRYHTHMIAS                               -LATE PRESENTING MI                               -COPD                            CLINICAL FOLLOW-UP RECOMMENDED.  TROPONIN VALUES >=0.25 ng/mL                         INDICATE POSSIBLE MYOCARDIAL                         ISCHEMIA. SERIAL TESTING                         RECOMMENDED.  [name] Sodium 07/19/2010 134*  . Potassium 07/19/2010 3.3*  . Chloride 07/19/2010 102   . CO2 07/19/2010 24   . Glucose, Bld 07/19/2010 111*  . BUN 07/19/2010 12   . Creatinine, Ser 07/19/2010 0.85   . Calcium 07/19/2010 9.0   . GFR calc non Af Amer 07/19/2010 >60   . GFR calc Af Amer 07/19/2010                     Value:>60                                The eGFR has been calculated                         using the MDRD equation.                         This calculation has not been                         validated in all clinical                         situations.                         eGFR's persistently                         <60 mL/min signify                         possible Chronic Kidney Disease.  . Pro B Natriuretic peptid* 07/19/2010 1850.0*  . Lactic Acid, Venous 07/19/2010 1.1   . Color, Urine 07/19/2010 YELLOW   . APPearance 07/19/2010 CLEAR   . Specific Gravity, Urine 07/19/2010 1.020   . pH 07/19/2010 5.5   . Glucose, UA 07/19/2010 NEGATIVE   . Hgb urine dipstick 07/19/2010 LARGE*  . Bilirubin Urine 07/19/2010 NEGATIVE   . Ketones, ur 07/19/2010 NEGATIVE   . Protein, ur 07/19/2010 30*  . Urobilinogen, UA 07/19/2010 0.2   . Nitrite 07/19/2010 NEGATIVE   . Leukocytes, UA 07/19/2010 NEGATIVE   . Squamous Epithelial / LPF 07/19/2010 RARE   . WBC, UA 07/19/2010 0-2   . RBC / HPF 07/19/2010 7-10   . Bacteria, UA 07/19/2010 RARE   . Procalcitonin 07/19/2010                     Value:0.29                                PCT (Procalcitonin) <= 0.5 ng/mL:  Systemic infection (sepsis) is not likely.                         Local bacterial infection is possible.                                                         PCT > 0.5 ng/mL and <= 2 ng/mL:                         Systemic infection (sepsis) is possible,                         but other conditions are known to elevate                         PCT as well.                                                        PCT > 2 ng/mL:                         Systemic infection (sepsis) is likely,                         unless other causes are known.                                                        PCT >= 10 ng/mL:                         Important systemic inflammatory response,                         almost exclusively due to severe bacterial                         sepsis or septic shock.  [name] Specimen Description 07/19/2010 BLOOD RIGHT ANTECUBITAL   . Special Requests 07/19/2010 BOTTLES DRAWN AEROBIC AND ANAEROBIC 6 CC EACH   . Culture 07/19/2010 NO GROWTH 5 DAYS   . Report Status 07/19/2010 07/24/2010 FINAL   . Specimen Description 07/19/2010 BLOOD LEFT ANTECUBITAL DRAWN BY RN   . Special Requests 07/19/2010 BOTTLES DRAWN AEROBIC AND ANAEROBIC 4 CC EACH   . Culture 07/19/2010 NO GROWTH 5 DAYS   . Report Status 07/19/2010 07/24/2010 FINAL   . MRSA by PCR 07/20/2010                     Value:NEGATIVE                                The GeneXpert MRSA Assay (FDA  approved for NASAL specimens                         only), is one component of a                         comprehensive MRSA colonization                         surveillance program. It is not                         intended to diagnose MRSA                         infection nor to guide or                         monitor treatment for                         MRSA infections.  . Sodium 07/21/2010 136   . Potassium 07/21/2010 3.4*  . Chloride 07/21/2010 105   . CO2 07/21/2010 24   . Glucose, Bld 07/21/2010 93   . BUN 07/21/2010 16   . Creatinine, Ser 07/21/2010 0.81   . Calcium 07/21/2010 8.6   . GFR calc  non Af Amer 07/21/2010 >60   . GFR calc Af Amer 07/21/2010                     Value:>60                                The eGFR has been calculated                         using the MDRD equation.                         This calculation has not been                         validated in all clinical                         situations.                         eGFR's persistently                         <60 mL/min signify                         possible Chronic Kidney Disease.  . Sodium 07/22/2010 135   . Potassium 07/22/2010 4.3 DELTA CHECK NOTED   . Chloride 07/22/2010 106   . CO2 07/22/2010 22   . Glucose, Bld 07/22/2010 89   . BUN 07/22/2010 16   . Creatinine, Ser 07/22/2010 0.79   . Calcium 07/22/2010 8.5   . GFR calc non Af Amer 07/22/2010 >60   . GFR calc Af Wyvonnia Lora 07/22/2010  Value:>60                                The eGFR has been calculated                         using the MDRD equation.                         This calculation has not been                         validated in all clinical                         situations.                         eGFR's persistently                         <60 mL/min signify                         possible Chronic Kidney Disease.  . Pro B Natriuretic peptid* 07/22/2010 233.0*  . Sodium 07/23/2010 135   . Potassium 07/23/2010 4.1   . Chloride 07/23/2010 106   . CO2 07/23/2010 22   . Glucose, Bld 07/23/2010 84   . BUN 07/23/2010 13   . Creatinine, Ser 07/23/2010 0.72   . Calcium 07/23/2010 8.5   . GFR calc non Af Amer 07/23/2010 >60   . GFR calc Af Amer 07/23/2010                     Value:>60                                The eGFR has been calculated                         using the MDRD equation.                         This calculation has not been                         validated in all clinical                         situations.                         eGFR's persistently                          <60 mL/min signify                         possible Chronic Kidney Disease.  . Sodium 07/24/2010 141   . Potassium 07/24/2010 3.7   . Chloride 07/24/2010 109   . CO2 07/24/2010 23   . Glucose, Bld 07/24/2010 87   . BUN 07/24/2010 13   . Creatinine, Ser 07/24/2010 0.72   . Calcium 07/24/2010  8.5   . GFR calc non Af Amer 07/24/2010 >60   . GFR calc Af Amer 07/24/2010                     Value:>60                                The eGFR has been calculated                         using the MDRD equation.                         This calculation has not been                         validated in all clinical                         situations.                         eGFR's persistently                         <60 mL/min signify                         possible Chronic Kidney Disease.     Results for this Opt Visit:     Results for orders placed during the hospital encounter of 07/19/10  DIFFERENTIAL      Component Value Range   Neutrophils Relative 84 (*) 43 - 77 %   Neutro Abs 7.2  1.7 - 7.7 K/uL   Lymphocytes Relative 7 (*) 12 - 46 %   Lymphs Abs 0.6 (*) 0.7 - 4.0 K/uL   Monocytes Relative 9  3 - 12 %   Monocytes Absolute 0.7  0.1 - 1.0 K/uL   Eosinophils Relative 0  0 - 5 %   Eosinophils Absolute 0.0  0.0 - 0.7 K/uL   Basophils Relative 0  0 - 1 %   Basophils Absolute 0.0  0.0 - 0.1 K/uL  CBC      Component Value Range   WBC 8.5  4.0 - 10.5 K/uL   RBC 4.13  3.87 - 5.11 MIL/uL   Hemoglobin 10.7 (*) 12.0 - 15.0 g/dL   HCT 33.5 (*) 36.0 - 46.0 %   MCV 81.1  78.0 - 100.0 fL   MCH 25.9 (*) 26.0 - 34.0 pg   MCHC 31.9  30.0 - 36.0 g/dL   RDW 16.4 (*) 11.5 - 15.5 %   Platelets 241  150 - 400 K/uL  POCT CARDIAC MARKERS      Component Value Range   Myoglobin, poc >500 (*) 12 - 200 ng/mL   CKMB, poc 1.8  1.0 - 8.0 ng/mL   Troponin i, poc <0.05  0.00 - 0.09 ng/mL   Comment       Value:            TROPONIN VALUES IN THE RANGE     OF 0.00-0.09 ng/mL SHOW     NO INDICATION  OF     MYOCARDIAL INJURY.                PERSISTENTLY INCREASED TROPONIN  VALUES IN THE RANGE OF 0.10-0.24     ng/mL CAN BE SEEN IN:           -UNSTABLE ANGINA           -CONGESTIVE HEART FAILURE           -MYOCARDITIS           -CHEST TRAUMA           -ARRYHTHMIAS           -LATE PRESENTING MI           -COPD       CLINICAL FOLLOW-UP RECOMMENDED.                TROPONIN VALUES >=0.25 ng/mL     INDICATE POSSIBLE MYOCARDIAL     ISCHEMIA. SERIAL TESTING     RECOMMENDED.  BASIC METABOLIC PANEL      Component Value Range   Sodium 134 (*) 135 - 145 mEq/L   Potassium 3.3 (*) 3.5 - 5.1 mEq/L   Chloride 102  96 - 112 mEq/L   CO2 24  19 - 32 mEq/L   Glucose, Bld 111 (*) 70 - 99 mg/dL   BUN 12  6 - 23 mg/dL   Creatinine, Ser 0.85  0.4 - 1.2 mg/dL   Calcium 9.0  8.4 - 10.5 mg/dL   GFR calc non Af Amer >60  >60 mL/min   GFR calc Af Amer    >60 mL/min   Value: >60            The eGFR has been calculated     using the MDRD equation.     This calculation has not been     validated in all clinical     situations.     eGFR's persistently     <60 mL/min signify     possible Chronic Kidney Disease.  BRAIN NATRIURETIC PEPTIDE      Component Value Range   Pro B Natriuretic peptide (BNP) 1850.0 (*) 0.0 - 100.0 pg/mL  LACTIC ACID, PLASMA      Component Value Range   Lactic Acid, Venous 1.1  0.5 - 2.2 mmol/L  URINALYSIS, ROUTINE W REFLEX MICROSCOPIC      Component Value Range   Color, Urine YELLOW  YELLOW   APPearance CLEAR  CLEAR   Specific Gravity, Urine 1.020  1.005 - 1.030   pH 5.5  5.0 - 8.0   Glucose, UA NEGATIVE  NEGATIVE mg/dL   Hgb urine dipstick LARGE (*) NEGATIVE   Bilirubin Urine NEGATIVE  NEGATIVE   Ketones, ur NEGATIVE  NEGATIVE mg/dL   Protein, ur 30 (*) NEGATIVE mg/dL   Urobilinogen, UA 0.2  0.0 - 1.0 mg/dL   Nitrite NEGATIVE  NEGATIVE   Leukocytes, UA NEGATIVE  NEGATIVE  URINE MICROSCOPIC-ADD ON      Component Value Range   Squamous Epithelial / LPF RARE   RARE   WBC, UA 0-2  <3 WBC/hpf   RBC / HPF 7-10  <3 RBC/hpf   Bacteria, UA RARE  RARE  PROCALCITONIN      Component Value Range   Procalcitonin       Value: 0.29            PCT (Procalcitonin) <= 0.5 ng/mL:     Systemic infection (sepsis) is not likely.     Local bacterial infection is possible.                PCT > 0.5 ng/mL  and <= 2 ng/mL:     Systemic infection (sepsis) is possible,     but other conditions are known to elevate     PCT as well.                PCT > 2 ng/mL:     Systemic infection (sepsis) is likely,     unless other causes are known.                PCT >= 10 ng/mL:     Important systemic inflammatory response,     almost exclusively due to severe bacterial     sepsis or septic shock.  CULTURE, BLOOD (ROUTINE X 2)      Component Value Range   Specimen Description BLOOD RIGHT ANTECUBITAL     Special Requests BOTTLES DRAWN AEROBIC AND ANAEROBIC 6 CC EACH     Culture NO GROWTH 5 DAYS     Report Status 07/24/2010 FINAL    CULTURE, BLOOD (ROUTINE X 2)      Component Value Range   Specimen Description BLOOD LEFT ANTECUBITAL DRAWN BY RN     Special Requests BOTTLES DRAWN AEROBIC AND ANAEROBIC 4 CC EACH     Culture NO GROWTH 5 DAYS     Report Status 07/24/2010 FINAL    MRSA PCR SCREENING      Component Value Range   MRSA by PCR    NEGATIVE   Value: NEGATIVE            The GeneXpert MRSA Assay (FDA     approved for NASAL specimens     only), is one component of a     comprehensive MRSA colonization     surveillance program. It is not     intended to diagnose MRSA     infection nor to guide or     monitor treatment for     MRSA infections.  BASIC METABOLIC PANEL      Component Value Range   Sodium 136  135 - 145 mEq/L   Potassium 3.4 (*) 3.5 - 5.1 mEq/L   Chloride 105  96 - 112 mEq/L   CO2 24  19 - 32 mEq/L   Glucose, Bld 93  70 - 99 mg/dL   BUN 16  6 - 23 mg/dL   Creatinine, Ser 0.81  0.4 - 1.2 mg/dL   Calcium 8.6  8.4 - 10.5 mg/dL   GFR calc non  Af Amer >60  >60 mL/min   GFR calc Af Amer    >60 mL/min   Value: >60            The eGFR has been calculated     using the MDRD equation.     This calculation has not been     validated in all clinical     situations.     eGFR's persistently     <60 mL/min signify     possible Chronic Kidney Disease.  BASIC METABOLIC PANEL      Component Value Range   Sodium 135  135 - 145 mEq/L   Potassium 4.3 DELTA CHECK NOTED  3.5 - 5.1 mEq/L   Chloride 106  96 - 112 mEq/L   CO2 22  19 - 32 mEq/L   Glucose, Bld 89  70 - 99 mg/dL   BUN 16  6 - 23 mg/dL   Creatinine, Ser 0.79  0.4 - 1.2 mg/dL   Calcium 8.5  8.4 - 10.5 mg/dL  GFR calc non Af Amer >60  >60 mL/min   GFR calc Af Amer    >60 mL/min   Value: >60            The eGFR has been calculated     using the MDRD equation.     This calculation has not been     validated in all clinical     situations.     eGFR's persistently     <60 mL/min signify     possible Chronic Kidney Disease.  BRAIN NATRIURETIC PEPTIDE      Component Value Range   Pro B Natriuretic peptide (BNP) 233.0 (*) 0.0 - 100.0 pg/mL  BASIC METABOLIC PANEL      Component Value Range   Sodium 135  135 - 145 mEq/L   Potassium 4.1  3.5 - 5.1 mEq/L   Chloride 106  96 - 112 mEq/L   CO2 22  19 - 32 mEq/L   Glucose, Bld 84  70 - 99 mg/dL   BUN 13  6 - 23 mg/dL   Creatinine, Ser 0.72  0.4 - 1.2 mg/dL   Calcium 8.5  8.4 - 10.5 mg/dL   GFR calc non Af Amer >60  >60 mL/min   GFR calc Af Amer    >60 mL/min   Value: >60            The eGFR has been calculated     using the MDRD equation.     This calculation has not been     validated in all clinical     situations.     eGFR's persistently     <60 mL/min signify     possible Chronic Kidney Disease.  BASIC METABOLIC PANEL      Component Value Range   Sodium 141  135 - 145 mEq/L   Potassium 3.7  3.5 - 5.1 mEq/L   Chloride 109  96 - 112 mEq/L   CO2 23  19 - 32 mEq/L   Glucose, Bld 87  70 - 99 mg/dL   BUN 13  6 - 23  mg/dL   Creatinine, Ser 0.72  0.4 - 1.2 mg/dL   Calcium 8.5  8.4 - 10.5 mg/dL   GFR calc non Af Amer >60  >60 mL/min   GFR calc Af Amer    >60 mL/min   Value: >60            The eGFR has been calculated     using the MDRD equation.     This calculation has not been     validated in all clinical     situations.     eGFR's persistently     <60 mL/min signify     possible Chronic Kidney Disease.    EKG Orders placed in visit on 05/20/12  . EKG 12-LEAD     Prior Assessment and Plan Problem List as of 05/20/2012          HYPERLIPIDEMIA   Last Assessment & Plan Note   08/10/2010 Office Visit Signed 08/10/2010  1:05 PM by Lendon Colonel, NP    As far as I can tell, this was not checked in the hospital admission.  I will check this on next blood draw in one month.    CORONARY ATHEROSCLEROSIS NATIVE CORONARY ARTERY   Last Assessment & Plan Note   09/15/2010 Office Visit Signed 09/15/2010  3:40 PM by Lendon Colonel, NP    Stable and asymptomatic. Will  continue current medications without changes.    Chronic systolic heart failure   Last Assessment & Plan Note   09/15/2010 Office Visit Signed 09/15/2010  3:40 PM by Lendon Colonel, NP    She is stable from CV standpoint. Wt has been stable, she is asymptomatic.  I have advised her to stop taking the metoprolol in addition to coreg dose.  She is given a couple of weeks of benicar 20mg  daily to help her until she can afford lisinopril dose at the first of the month.  She is to continue to weigh herself daily and avoid salt.  Will see her again in 3 months unless symptomatic.    LOWER LEG, ARTHRITIS, DEGEN./OSTEO   HEARTBURN   TOTAL KNEE FOLLOW-UP       Imaging: No results found.   FRS Calculation: Score not calculated. Missing: Total Cholesterol

## 2012-05-20 NOTE — Assessment & Plan Note (Signed)
No evidence of fluid overload on clinical assessment. She is without cardiac complaint. Will continue current medications. Will have CMET completed with results to Dr. Legrand Rams. May need follow up Echo on next visit. See her in 6 months.

## 2012-05-20 NOTE — Assessment & Plan Note (Signed)
Fasting lipid profile will be completed.

## 2012-05-20 NOTE — Progress Notes (Signed)
HPI: [name] is a 72 y/o patient of Dr. Lattie Haw we are following for ongoing assessment and treatment of CAD s/p PTCA, ICM with EF of 40-45%, and hyperlipidemia. She comes today after 18 month hiatus and is without complaint presently. She has had some episodes of fluid retention in the lower extremities, treated by Dr.Fanta. She has not had any hospitalizations or ER visits. Her wt is essentially the same as it was when she was seen last. She is having some complaints of soreness in her feet and heels. She is medically compliant.   No Known Allergies  Current Outpatient Prescriptions  Medication Sig Dispense Refill  . acetaminophen (TYLENOL) 325 MG tablet Take 650 mg by mouth every 6 (six) hours as needed. For pain      . aspirin EC 81 MG tablet Take 81 mg by mouth daily.        . carvedilol (COREG) 25 MG tablet Take 1 tablet (25 mg total) by mouth 2 (two) times daily with a meal.  60 tablet  5  . furosemide (LASIX) 40 MG tablet Take 1 tablet (40 mg total) by mouth 2 (two) times daily.  60 tablet  5  . ibuprofen (ADVIL,MOTRIN) 200 MG tablet Take 200 mg by mouth daily as needed. For pain       . lisinopril (PRINIVIL,ZESTRIL) 40 MG tablet Take 1 tablet (40 mg total) by mouth daily.  30 tablet  5  . lovastatin (MEVACOR) 20 MG tablet Take 20 mg by mouth at bedtime.        [name] omeprazole (PRILOSEC) 20 MG capsule Take 20 mg by mouth daily as needed. For gas/heartburn       . prednisoLONE acetate (PRED FORTE) 1 % ophthalmic suspension Place 1 drop into both eyes 4 (four) times daily.       [name] spironolactone (ALDACTONE) 25 MG tablet Take 1 tablet (25 mg total) by mouth daily.  30 tablet  5  . metoprolol (LOPRESSOR) 50 MG tablet Take 1 tablet (50 mg total) by mouth 2 (two) times daily.  60 tablet  5    Past Medical History  Diagnosis Date  . CAD (coronary artery disease)     reported remote PTCA  . Cardiomyopathy     ischemic LVEF 40-45%  . Anemia   . Hyperlipidemia   . Breast cancer   .  Arthritis     Past Surgical History  Procedure Date  . Mastectomy 1998  . Total knee arthroplasty     right Dr.Harrison    [id number] of systems complete and found to be negative unless listed above  PHYSICAL EXAM BP 140/74  Pulse 69  Ht 5\' 4"  (1.626 m)  Wt 153 lb (69.4 kg)  BMI 26.26 kg/m2  General: Well developed, well nourished, in no acute distress Head: Eyes PERRLA, No xanthomas.   Normal cephalic and atramatic  Lungs: Clear bilaterally to auscultation and percussion. Heart: HRRR S1 S2, without MRG.  Pulses are 2+ & equal.            No carotid bruit. No JVD.  No abdominal bruits. No femoral bruits. Abdomen: Bowel sounds are positive, abdomen soft and non-tender without masses or                  Hernia's noted. Msk:  Back normal, normal gait. Normal strength and tone for age. Extremities: No clubbing, cyanosis or edema.  DP +1 Neuro: Alert and oriented X 3. Psych:  Good affect, responds  appropriately  EKG: NSR with LBBB rate of 79 bpm.   ASSESSMENT AND PLAN

## 2012-05-20 NOTE — Patient Instructions (Addendum)
Your physician recommends that you schedule a follow-up appointment in: [place] [facility] TO BE MEASURED   Your physician recommends that you return for lab work in: [place] (CMET, FASTING LIPIDS) [place]  Your physician has requested that you have an echocardiogram. Echocardiography is a painless test that uses sound waves to create images of your heart. It provides your doctor with information about the size and shape of your heart and how well your heart's chambers and valves are working. This procedure takes approximately one hour. There are no restrictions for this procedure.   YOUR MEDICATIONS HAVE BEEN REFILLED

## 2012-05-23 ENCOUNTER — Telehealth: Payer: Self-pay | Admitting: *Deleted

## 2012-05-23 ENCOUNTER — Ambulatory Visit (HOSPITAL_COMMUNITY)
Admission: RE | Admit: 2012-05-23 | Discharge: 2012-05-23 | Disposition: A | Discharge status: Home / Self Care | Payer: PRIVATE HEALTH INSURANCE | Source: Ambulatory Visit | Attending: Adult Health | Admitting: Adult Health

## 2012-05-23 DIAGNOSIS — I2510 Atherosclerotic heart disease of native coronary artery without angina pectoris: Secondary | ICD-10-CM | POA: Insufficient documentation

## 2012-05-23 DIAGNOSIS — I10 Essential (primary) hypertension: Secondary | ICD-10-CM

## 2012-05-23 DIAGNOSIS — I519 Heart disease, unspecified: Secondary | ICD-10-CM

## 2012-05-23 DIAGNOSIS — I517 Cardiomegaly: Secondary | ICD-10-CM

## 2012-05-23 DIAGNOSIS — Z853 Personal history of malignant neoplasm of breast: Secondary | ICD-10-CM | POA: Insufficient documentation

## 2012-05-23 LAB — LIPID PANEL
Cholesterol: 134 mg/dL (ref 0–200)
LDL Cholesterol: 83 mg/dL (ref 0–99)
Triglycerides: 103 mg/dL (ref <150)

## 2012-05-23 LAB — COMPREHENSIVE METABOLIC PANEL
Albumin: 3.8 g/dL (ref 3.5–5.2)
Alkaline Phosphatase: 86 U/L (ref 39–117)
CO2: 29 mEq/L (ref 19–32)
Chloride: 105 mEq/L (ref 96–112)
Glucose, Bld: 86 mg/dL (ref 70–99)
Potassium: 3.7 mEq/L (ref 3.5–5.3)
Sodium: 145 mEq/L (ref 135–145)
Total Protein: 8 g/dL (ref 6.0–8.3)

## 2012-05-23 NOTE — Telephone Encounter (Signed)
Message copied by Alba Destine on Fri May 23, 2012  3:58 PM ------      Message from: Priscille Loveless      Created: Fri May 23, 2012  3:47 PM       Appointment made for February 17th @ 1:30..  Please advise patient.              Thanks,      Coralyn Mark      ----- Message -----         From: Alba Destine, LPN         Sent: [id number]   1:12 PM           To: Priscille Loveless            Please call and schedule an apt for pt to discuss results with RR per KL notations below:            Significant decrease in EF per echo. Please make follow up appointment with Dr.Rothbart (not me) for discussion of need to refer for ICD placement.               Thank you, if you want me to call pt after apt is made i will be glad to do after clinic

## 2012-05-23 NOTE — Telephone Encounter (Signed)
Called pt to advise apt, no voicemail capability noted, will call back again

## 2012-05-23 NOTE — Progress Notes (Signed)
*  PRELIMINARY RESULTS* Echocardiogram 2D Echocardiogram has been performed.  Tera Partridge 05/23/2012, 9:57 AM

## 2012-05-26 NOTE — Telephone Encounter (Signed)
Husband made aware of appointment

## 2012-05-28 ENCOUNTER — Telehealth: Payer: Self-pay | Admitting: *Deleted

## 2012-05-28 NOTE — Telephone Encounter (Signed)
Message copied by Alba Destine on Wed May 28, 2012 12:10 PM ------      Message from: Priscille Loveless      Created: Fri May 23, 2012  3:47 PM       Appointment made for February 17th @ 1:30..  Please advise patient.              Thanks,      Coralyn Mark      ----- Message -----         From: Alba Destine, LPN         Sent: [id number]   1:12 PM           To: Priscille Loveless            Please call and schedule an apt for pt to discuss results with RR per KL notations below:            Significant decrease in EF per echo. Please make follow up appointment with Dr.Rothbart (not me) for discussion of need to refer for ICD placement.               Thank you, if you want me to call pt after apt is made i will be glad to do after clinic

## 2012-05-28 NOTE — Telephone Encounter (Signed)
Called pt to advise apt date and time for RR on 06-09-12 at 1:30pm to discuss results of her echo, pt understood and will keep her apt

## 2012-06-09 ENCOUNTER — Encounter: Payer: Self-pay | Admitting: Cardiology

## 2012-06-09 ENCOUNTER — Ambulatory Visit (INDEPENDENT_AMBULATORY_CARE_PROVIDER_SITE_OTHER): Payer: PRIVATE HEALTH INSURANCE | Admitting: Cardiology

## 2012-06-09 VITALS — BP 142/72 | HR 62 | Ht 64.0 in | Wt 149.0 lb

## 2012-06-09 DIAGNOSIS — E785 Hyperlipidemia, unspecified: Secondary | ICD-10-CM

## 2012-06-09 DIAGNOSIS — I2510 Atherosclerotic heart disease of native coronary artery without angina pectoris: Secondary | ICD-10-CM

## 2012-06-09 DIAGNOSIS — I5022 Chronic systolic (congestive) heart failure: Secondary | ICD-10-CM

## 2012-06-09 DIAGNOSIS — I7090 Unspecified atherosclerosis: Secondary | ICD-10-CM

## 2012-06-09 DIAGNOSIS — I10 Essential (primary) hypertension: Secondary | ICD-10-CM | POA: Insufficient documentation

## 2012-06-09 DIAGNOSIS — C50919 Malignant neoplasm of unspecified site of unspecified female breast: Secondary | ICD-10-CM | POA: Insufficient documentation

## 2012-06-09 DIAGNOSIS — D638 Anemia in other chronic diseases classified elsewhere: Secondary | ICD-10-CM | POA: Insufficient documentation

## 2012-06-09 DIAGNOSIS — D649 Anemia, unspecified: Secondary | ICD-10-CM

## 2012-06-09 NOTE — Assessment & Plan Note (Signed)
Presence of coronary artery disease has never been definitely demonstrated. Cardiac catheterization could conveniently be performed during a hospitalization for AICD implantation.

## 2012-06-09 NOTE — Assessment & Plan Note (Signed)
Blood pressure control has been suboptimal with acceptable diastolics, but systolics in the [id number] range.  Additional antihypertensive medication will be prescribed.

## 2012-06-09 NOTE — Assessment & Plan Note (Addendum)
Acceptable lipid profile last year, but as long she requires treatment with a statin, a higher dose will produce even a more favorable effect on her serum lipids and will be ordered.

## 2012-06-09 NOTE — Assessment & Plan Note (Addendum)
Patient has been well compensated with respect to congestive heart failure despite apparent deterioration in left ventricular systolic function. She appears to qualify for an AICD, but is not certain that she wishes to undergo surgical implantation. She will be evaluated by our EP service for further discussion of this treatment modality.  Concurrent biventricular pacing would be performed, which might provide her substantial additional benefit.

## 2012-06-09 NOTE — Progress Notes (Deleted)
Name: [name]    DOB: [birth year]  Age: 72 y.o.  MR#: [id number]       PCP:  [name], MD      Insurance: Payor: [employer] MEDICARE  Plan: Janyce Llanos FREEDOM  Product Type: *No Product type*    CC:   No chief complaint on file.   VS Filed Vitals:   06/09/12 1307  BP: 142/72  Pulse: 62  Height: 5\' 4"  (1.626 m)  Weight: 149 lb (67.586 kg)  SpO2: 96%    Weights Current Weight  06/09/12 149 lb (67.586 kg)  05/20/12 153 lb (69.4 kg)  03/25/11 150 lb (68.04 kg)    Blood Pressure  BP Readings from Last 3 Encounters:  06/09/12 142/72  05/20/12 140/74  03/25/11 166/73     Admit date:  (Not on file) Last encounter with RMR:  03/26/2012   Allergy Review of patient's allergies indicates no known allergies.  Current Outpatient Prescriptions  Medication Sig Dispense Refill  . aspirin EC 81 MG tablet Take 81 mg by mouth daily.        . carvedilol (COREG) 25 MG tablet Take 1 tablet (25 mg total) by mouth 2 (two) times daily with a meal.  60 tablet  5  . furosemide (LASIX) 40 MG tablet Take 1 tablet (40 mg total) by mouth 2 (two) times daily.  60 tablet  5  . lisinopril (PRINIVIL,ZESTRIL) 40 MG tablet Take 1 tablet (40 mg total) by mouth daily.  30 tablet  5  . lovastatin (MEVACOR) 20 MG tablet Take 20 mg by mouth at bedtime.        . metoprolol (LOPRESSOR) 50 MG tablet Take 1 tablet (50 mg total) by mouth 2 (two) times daily.  60 tablet  5  . omeprazole (PRILOSEC) 20 MG capsule Take 20 mg by mouth daily as needed. For gas/heartburn       . spironolactone (ALDACTONE) 25 MG tablet Take 1 tablet (25 mg total) by mouth daily.  30 tablet  5   No current facility-administered medications for this visit.    Discontinued Meds:    Medications Discontinued During This Encounter  Medication Reason  . acetaminophen (TYLENOL) 325 MG tablet Discontinued by provider  . ibuprofen (ADVIL,MOTRIN) 200 MG tablet Discontinued by provider  . prednisoLONE acetate (PRED FORTE) 1 %  ophthalmic suspension Discontinued by provider    Patient Active Problem List  Diagnosis  . HYPERLIPIDEMIA  . Chronic systolic heart failure  . Arteriosclerotic cardiovascular disease (ASCVD)  . Anemia  . Breast cancer  . Hypertension    LABS @BMET3 @  @CMPRESULT3 @ @CBC3 @  Lipid Panel     Component Value Date/Time   CHOL 134 05/23/2012 0905   TRIG 103 05/23/2012 0905   HDL 30* 05/23/2012 0905   CHOLHDL 4.5 05/23/2012 0905   VLDL 21 05/23/2012 0905   LDLCALC 83 05/23/2012 0905    ABG No results found for this basename: phart, pco2, pco2art, po2, po2art, hco3, tco2, acidbasedef, o2sat     BNP (last 3 results) No results found for this basename: PROBNP,  in the last 8760 hours Cardiac Panel (last 3 results) No results found for this basename: CKTOTAL, CKMB, TROPONINI, RELINDX,  in the last 72 hours  Iron/TIBC/Ferritin    Component Value Date/Time   IRON 50 05/27/2008 1046   TIBC 199* 05/27/2008 1046   FERRITIN 120 05/27/2008 1046     EKG Orders placed in visit on 05/20/12  . EKG 12-LEAD     Prior  Assessment and Plan Problem List as of 06/09/2012     ICD-9-CM     Cardiology Problems   HYPERLIPIDEMIA   Last Assessment & Plan   05/20/2012 Office Visit Written 05/20/2012  2:05 PM by Lendon Colonel, NP     Fasting lipid profile will be completed.     Chronic systolic heart failure   Last Assessment & Plan   05/20/2012 Office Visit Written 05/20/2012  2:04 PM by Lendon Colonel, NP     No evidence of fluid overload on clinical assessment. She is without cardiac complaint. Will continue current medications. Will have CMET completed with results to Dr. Legrand Rams. May need follow up Echo on next visit. See her in 6 months.    Arteriosclerotic cardiovascular disease (ASCVD)   Hypertension     Other   Anemia   Breast cancer       Imaging: No results found.

## 2012-06-09 NOTE — Progress Notes (Signed)
Patient ID: [name], female   DOB: Nov 14, 1940, 72 y.o.   MRN: [id number]  HPI: Schedule return visit for this nice woman, seen by Dr. Domenic Polite on many occasions in the office, but not within the past 2 years, who now returns after a recent echocardiogram demonstrated deterioration in left ventricular systolic function. Despite this, patient appears to be asymptomatic. She is rather diffuse historian, and I find it difficult to precisely define her symptoms, if any, or to appreciate their time course. One thing is certain-she has not been hospitalized for cardiac problems in years.  Etiology of cardiomyopathy has been considered to be ischemic heart disease, as she initially described a history of PTCA, but this has never been documented.  EF was 20% in 2011, 40-45% on a subsequent echocardiogram and now is 25%. Stress nuclear study in 2009 suggested inferoseptal and apical scar without ischemia.  Current Outpatient Prescriptions  Medication Sig Dispense Refill  . aspirin EC 81 MG tablet Take 81 mg by mouth daily.        . carvedilol (COREG) 25 MG tablet Take 1 tablet (25 mg total) by mouth 2 (two) times daily with a meal.  60 tablet  5  . furosemide (LASIX) 40 MG tablet Take 1 tablet (40 mg total) by mouth 2 (two) times daily.  60 tablet  5  . lisinopril (PRINIVIL,ZESTRIL) 40 MG tablet Take 1 tablet (40 mg total) by mouth daily.  30 tablet  5  . lovastatin (MEVACOR) 20 MG tablet Take 20 mg by mouth at bedtime.        . metoprolol (LOPRESSOR) 50 MG tablet Take 1 tablet (50 mg total) by mouth 2 (two) times daily.  60 tablet  5  . omeprazole (PRILOSEC) 20 MG capsule Take 20 mg by mouth daily as needed. For gas/heartburn       . spironolactone (ALDACTONE) 25 MG tablet Take 1 tablet (25 mg total) by mouth daily.  30 tablet  5   No current facility-administered medications for this visit.   No Known Allergies    Past medical history, social history, and family history reviewed and updated.  ROS:  Denies pedal edema, orthopnea, PND, palpitations, lightheadedness or syncope. She describes a prior episode of fullness in her chest, but I cannot determine when this occurred. All other systems reviewed and are negative.  PHYSICAL EXAM: BP 142/72  Pulse 62  Ht 5\' 4"  (1.626 m)  Wt 67.586 kg (149 lb)  BMI 25.56 kg/m2  SpO2 96%;  Body mass index is 25.56 kg/(m^2). General-Well developed; no acute distress Body habitus-proportionate weight and height Neck-No JVD; no carotid bruits Lungs-clear lung fields; resonant to percussion Cardiovascular-normal PMI; normal S1 and S2; early basilar systolic ejection murmur Abdomen-normal bowel sounds; soft and non-tender without masses or organomegaly Musculoskeletal-No deformities, no cyanosis or clubbing Neurologic-Normal cranial nerves; symmetric strength and tone Skin-Warm, no significant lesions Extremities-distal pulses intact; no edema  EKG: Tracing performed 05/20/2012 reveals sinus rhythm, PVCs and left bundle branch block.  Jacqulyn Ducking, MD 06/09/2012  5:37 PM  ASSESSMENT AND PLAN

## 2012-06-09 NOTE — Assessment & Plan Note (Signed)
No recent assessment of anemia; CBC will be obtained.

## 2012-06-09 NOTE — Patient Instructions (Addendum)
Your physician recommends that you schedule a follow-up appointment in: 6 months with Dr Domenic Polite and as soon as possible with Dr Lovena Le.

## 2012-06-16 ENCOUNTER — Telehealth: Payer: Self-pay | Admitting: *Deleted

## 2012-06-16 MED ORDER — LOVASTATIN 40 MG PO TABS: 40.0000 mg | ORAL_TABLET | Freq: Every day | ORAL | Status: DC

## 2012-06-16 MED ORDER — AMLODIPINE BESYLATE 5 MG PO TABS: 5.0000 mg | ORAL_TABLET | Freq: Every day | ORAL | Status: DC

## 2012-06-16 NOTE — Telephone Encounter (Signed)
Written instructions received from Dr Lattie Haw to increase lovastatin to 40 mg daily and add Amlodipine 5 mg daily.  Patient and family notified of changes and verbalized understanding.

## 2012-06-21 ENCOUNTER — Other Ambulatory Visit: Payer: Self-pay | Admitting: Cardiology

## 2012-07-14 ENCOUNTER — Encounter: Payer: Self-pay | Admitting: Internal Medicine

## 2012-07-14 ENCOUNTER — Ambulatory Visit (INDEPENDENT_AMBULATORY_CARE_PROVIDER_SITE_OTHER): Payer: No Typology Code available for payment source | Admitting: Internal Medicine

## 2012-07-14 ENCOUNTER — Encounter: Payer: Self-pay | Admitting: *Deleted

## 2012-07-14 VITALS — BP 120/60 | HR 56 | Ht 64.0 in | Wt 153.0 lb

## 2012-07-14 DIAGNOSIS — I10 Essential (primary) hypertension: Secondary | ICD-10-CM

## 2012-07-14 DIAGNOSIS — I5022 Chronic systolic (congestive) heart failure: Secondary | ICD-10-CM

## 2012-07-14 DIAGNOSIS — Z01818 Encounter for other preprocedural examination: Secondary | ICD-10-CM

## 2012-07-14 DIAGNOSIS — Z7901 Long term (current) use of anticoagulants: Secondary | ICD-10-CM

## 2012-07-14 DIAGNOSIS — I255 Ischemic cardiomyopathy: Secondary | ICD-10-CM

## 2012-07-14 DIAGNOSIS — I2589 Other forms of chronic ischemic heart disease: Secondary | ICD-10-CM

## 2012-07-14 NOTE — Progress Notes (Signed)
HPI [name] is referred today by Dr. Lattie Haw for evaluation and consideration for ICD implantation. The patient is a very pleasant 72 year old woman with a history of severe left ventricular dysfunction, and has a remote history of MI though this is not well documented. She is a scar on her stress test. The patient has had worsening heart failure symptoms, and left bundle branch block. 2 months ago, her ejection fraction was 20-25%. She is been on maximal medical therapy with diuretics, carvedilol, and lisinopril. She denies frank syncope. Minimal peripheral edema is present. She has dyspnea with exertion. She is a difficult historian. No Known Allergies   Current Outpatient Prescriptions  Medication Sig Dispense Refill  . amLODipine (NORVASC) 5 MG tablet Take 1 tablet (5 mg total) by mouth daily.  30 tablet  6  . aspirin EC 81 MG tablet Take 81 mg by mouth daily.        . carvedilol (COREG) 25 MG tablet Take 1 tablet (25 mg total) by mouth 2 (two) times daily with a meal.  60 tablet  5  . furosemide (LASIX) 40 MG tablet Take 1 tablet (40 mg total) by mouth 2 (two) times daily.  60 tablet  5  . lisinopril (PRINIVIL,ZESTRIL) 40 MG tablet Take 1 tablet (40 mg total) by mouth daily.  30 tablet  5  . lovastatin (MEVACOR) 40 MG tablet Take 1 tablet (40 mg total) by mouth at bedtime.  30 tablet  6  . metoprolol (LOPRESSOR) 50 MG tablet Take 1 tablet (50 mg total) by mouth 2 (two) times daily.  60 tablet  5  . omeprazole (PRILOSEC) 20 MG capsule Take 20 mg by mouth daily as needed. For gas/heartburn       . spironolactone (ALDACTONE) 25 MG tablet Take 1 tablet (25 mg total) by mouth daily.  30 tablet  5   No current facility-administered medications for this visit.     Past Medical History  Diagnosis Date  . Arteriosclerotic cardiovascular disease (ASCVD)     Remote PTCA by patient report; LBBB; associated cardiomyopathy, presumed ischemic with EF 40-45% previously, 20% in 06/2009; h/o clinical  congestive heart failure; negative stress nuclear in 2009 with inferoseptal and apical scar  . Anemia     Hgb of 9-10  . Hyperlipidemia   . Breast cancer   . Arthritis   . Pneumonia 2012  . Hypertension     ROS:   All systems reviewed and negative except as noted in the HPI.   Past Surgical History  Procedure Laterality Date  . Mastectomy  1998    Left  . Total knee arthroplasty      Right; Dr.Harrison     No family history on file.   History   Social History  . Marital Status: Married    Spouse Name: N/A    Number of Children: N/A  . Years of Education: N/A   Occupational History  . Retired    Social History Main Topics  . Smoking status: Former Research scientist (life sciences)  . Smokeless tobacco: Current User     Comment: snuff  . Alcohol Use: No  . Drug Use: No  . Sexually Active: Not on file   Other Topics Concern  . Not on file   Social History Narrative  . No narrative on file     BP 120/60  Pulse 56  Ht 5\' 4"  (1.626 m)  Wt 153 lb 0.6 oz (69.418 kg)  BMI 26.26 kg/m2  SpO2 97%  Physical Exam:  Well appearing 72 year old woman,NAD HEENT: Unremarkable Neck:  7 cm JVD, no thyromegally Lymphatics:  No adenopathy Back:  No CVA tenderness Lungs:  Clear except for basilar rales, no wheezes, no rhonchi. HEART:  Regular rate rhythm, no murmurs, no rubs, no clicks, split S2 is present Abd:  soft, positive bowel sounds, no organomegally, no rebound, no guarding Ext:  2 plus pulses, no edema, no cyanosis, no clubbing Skin:  No rashes no nodules Neuro:  CN II through XII intact, motor grossly intact  EKG - normal sinus rhythm with left bundle branch block, QRS duration 176 ms.   Assess/Plan:

## 2012-07-14 NOTE — Patient Instructions (Addendum)
Your physician recommends that you schedule a follow-up appointment in: [place] physician has requested that you have a cardiac catheterization. Cardiac catheterization is used to diagnose and/or treat various heart conditions. Doctors may recommend this procedure for a number of different reasons. The most common reason is to evaluate chest pain. Chest pain can be a symptom of coronary artery disease (CAD), and cardiac catheterization can show whether plaque is narrowing or blocking your heart's arteries. This procedure is also used to evaluate the valves, as well as measure the blood flow and oxygen levels in different parts of your heart. For further information please visit [email or web address]. Please follow instruction sheet, as given.  Your physician recommends that you return for lab work in: TODAY ([place]) CATH SET

## 2012-07-15 ENCOUNTER — Encounter: Payer: Self-pay | Admitting: Internal Medicine

## 2012-07-15 LAB — CBC
HCT: 32.7 % — ABNORMAL LOW (ref 36.0–46.0)
RDW: 16.8 % — ABNORMAL HIGH (ref 11.5–15.5)
WBC: 5.6 10*3/uL (ref 4.0–10.5)

## 2012-07-15 LAB — BASIC METABOLIC PANEL
BUN: 24 mg/dL — ABNORMAL HIGH (ref 6–23)
Chloride: 104 mEq/L (ref 96–112)
Potassium: 3.6 mEq/L (ref 3.5–5.3)

## 2012-07-15 LAB — PROTIME-INR: INR: 1.04 (ref <1.50)

## 2012-07-15 NOTE — Assessment & Plan Note (Signed)
The patient has chronic systolic heart failure with severe left ventricular dysfunction, class II symptoms, an ejection fraction of 20-25%, maximal medical therapy, and a QRS duration of 176 ms with left bundle branch block. She has long-standing left ventricular dysfunction. I discussed the treatment options with the patient. The risk, goals, benefits, and expectations, of biventricular device implantation a been discussed with the patient. She is her considering her options and will call us to schedule device implantation, with my bias being towards ICD implantation rather than pacemaker insertion.

## 2012-07-15 NOTE — Assessment & Plan Note (Signed)
Her blood pressure appears to be well-controlled despite some evidence of sodium indiscretion.

## 2012-07-21 ENCOUNTER — Encounter (HOSPITAL_COMMUNITY): Payer: Self-pay | Admitting: Pharmacy Technician

## 2012-07-27 MED ORDER — SODIUM CHLORIDE 0.9 % IR SOLN
80.0000 mg | Status: DC
  Filled 2012-07-27: qty 2

## 2012-07-27 MED ORDER — CEFAZOLIN SODIUM-DEXTROSE 2-3 GM-% IV SOLR
2.0000 g | INTRAVENOUS | Status: DC
  Filled 2012-07-27: qty 50

## 2012-07-28 ENCOUNTER — Encounter (HOSPITAL_COMMUNITY)
Admission: RE | Disposition: A | Discharge status: Home / Self Care | Payer: Self-pay | Source: Ambulatory Visit | Attending: Internal Medicine

## 2012-07-28 ENCOUNTER — Encounter (HOSPITAL_COMMUNITY): Payer: Self-pay | Admitting: General Practice

## 2012-07-28 ENCOUNTER — Ambulatory Visit (HOSPITAL_COMMUNITY)
Admission: RE | Admit: 2012-07-28 | Discharge: 2012-07-29 | Disposition: A | Discharge status: Home / Self Care | Payer: Medicare Other | Source: Ambulatory Visit | Attending: Internal Medicine | Admitting: Internal Medicine

## 2012-07-28 DIAGNOSIS — I509 Heart failure, unspecified: Secondary | ICD-10-CM | POA: Insufficient documentation

## 2012-07-28 DIAGNOSIS — I2589 Other forms of chronic ischemic heart disease: Secondary | ICD-10-CM

## 2012-07-28 DIAGNOSIS — D649 Anemia, unspecified: Secondary | ICD-10-CM

## 2012-07-28 DIAGNOSIS — I2510 Atherosclerotic heart disease of native coronary artery without angina pectoris: Secondary | ICD-10-CM

## 2012-07-28 DIAGNOSIS — E785 Hyperlipidemia, unspecified: Secondary | ICD-10-CM

## 2012-07-28 DIAGNOSIS — I252 Old myocardial infarction: Secondary | ICD-10-CM | POA: Insufficient documentation

## 2012-07-28 DIAGNOSIS — I10 Essential (primary) hypertension: Secondary | ICD-10-CM | POA: Insufficient documentation

## 2012-07-28 DIAGNOSIS — I5022 Chronic systolic (congestive) heart failure: Secondary | ICD-10-CM | POA: Insufficient documentation

## 2012-07-28 DIAGNOSIS — I447 Left bundle-branch block, unspecified: Secondary | ICD-10-CM | POA: Insufficient documentation

## 2012-07-28 HISTORY — PX: BI-VENTRICULAR IMPLANTABLE CARDIOVERTER DEFIBRILLATOR: SHX5459

## 2012-07-28 HISTORY — PX: BI-VENTRICULAR IMPLANTABLE CARDIOVERTER DEFIBRILLATOR  (CRT-D): SHX5747

## 2012-07-28 LAB — SURGICAL PCR SCREEN: Staphylococcus aureus: POSITIVE — AB

## 2012-07-28 SURGERY — BI-VENTRICULAR IMPLANTABLE CARDIOVERTER DEFIBRILLATOR  (CRT-D)
Anesthesia: LOCAL

## 2012-07-28 MED ORDER — CHLORHEXIDINE GLUCONATE 4 % EX LIQD: 60.0000 mL | Freq: Once | CUTANEOUS | Status: DC

## 2012-07-28 MED ORDER — ACETAMINOPHEN 325 MG PO TABS
325.0000 mg | ORAL_TABLET | ORAL | Status: DC | PRN
  Administered 2012-07-28 – 2012-07-29 (×2): 650 mg via ORAL
  Filled 2012-07-28 (×2): qty 2

## 2012-07-28 MED ORDER — SPIRONOLACTONE 25 MG PO TABS
25.0000 mg | ORAL_TABLET | Freq: Every day | ORAL | Status: DC
  Administered 2012-07-29: 25 mg via ORAL
  Filled 2012-07-28 (×2): qty 1

## 2012-07-28 MED ORDER — SODIUM CHLORIDE 0.9 % IV SOLN
INTRAVENOUS | Status: DC
  Administered 2012-07-28: 09:00:00 via INTRAVENOUS

## 2012-07-28 MED ORDER — MIDAZOLAM HCL 5 MG/5ML IJ SOLN
INTRAMUSCULAR | Status: AC
  Filled 2012-07-28: qty 5

## 2012-07-28 MED ORDER — CARVEDILOL 25 MG PO TABS
25.0000 mg | ORAL_TABLET | Freq: Two times a day (BID) | ORAL | Status: DC
  Administered 2012-07-28 – 2012-07-29 (×2): 25 mg via ORAL
  Filled 2012-07-28 (×4): qty 1

## 2012-07-28 MED ORDER — LIDOCAINE HCL (PF) 1 % IJ SOLN
INTRAMUSCULAR | Status: AC
  Filled 2012-07-28: qty 60

## 2012-07-28 MED ORDER — ASPIRIN 325 MG PO TABS
325.0000 mg | ORAL_TABLET | Freq: Every day | ORAL | Status: DC
  Administered 2012-07-29: 325 mg via ORAL
  Filled 2012-07-28 (×2): qty 1

## 2012-07-28 MED ORDER — FUROSEMIDE 40 MG PO TABS
40.0000 mg | ORAL_TABLET | Freq: Two times a day (BID) | ORAL | Status: DC
  Filled 2012-07-28 (×4): qty 1

## 2012-07-28 MED ORDER — AMLODIPINE BESYLATE 5 MG PO TABS
5.0000 mg | ORAL_TABLET | Freq: Every day | ORAL | Status: DC
  Administered 2012-07-29: 5 mg via ORAL
  Filled 2012-07-28 (×2): qty 1

## 2012-07-28 MED ORDER — PANTOPRAZOLE SODIUM 40 MG PO TBEC
40.0000 mg | DELAYED_RELEASE_TABLET | Freq: Every day | ORAL | Status: DC
  Administered 2012-07-29: 40 mg via ORAL
  Filled 2012-07-28: qty 1

## 2012-07-28 MED ORDER — METOPROLOL TARTRATE 50 MG PO TABS
50.0000 mg | ORAL_TABLET | Freq: Two times a day (BID) | ORAL | Status: DC
  Administered 2012-07-28 – 2012-07-29 (×2): 50 mg via ORAL
  Filled 2012-07-28 (×4): qty 1

## 2012-07-28 MED ORDER — MUPIROCIN 2 % EX OINT: TOPICAL_OINTMENT | Freq: Two times a day (BID) | CUTANEOUS | Status: DC

## 2012-07-28 MED ORDER — ONDANSETRON HCL 4 MG/2ML IJ SOLN: 4.0000 mg | Freq: Four times a day (QID) | INTRAMUSCULAR | Status: DC | PRN

## 2012-07-28 MED ORDER — LISINOPRIL 40 MG PO TABS
40.0000 mg | ORAL_TABLET | Freq: Every day | ORAL | Status: DC
  Administered 2012-07-29: 40 mg via ORAL
  Filled 2012-07-28 (×2): qty 1

## 2012-07-28 MED ORDER — CEFAZOLIN SODIUM-DEXTROSE 2-3 GM-% IV SOLR
2.0000 g | Freq: Four times a day (QID) | INTRAVENOUS | Status: AC
  Administered 2012-07-28 – 2012-07-29 (×3): 2 g via INTRAVENOUS
  Filled 2012-07-28 (×3): qty 50

## 2012-07-28 MED ORDER — FENTANYL CITRATE 0.05 MG/ML IJ SOLN
INTRAMUSCULAR | Status: AC
  Filled 2012-07-28: qty 2

## 2012-07-28 MED ORDER — MUPIROCIN 2 % EX OINT
TOPICAL_OINTMENT | CUTANEOUS | Status: AC
  Administered 2012-07-28: 09:00:00
  Filled 2012-07-28: qty 22

## 2012-07-28 MED ORDER — HEPARIN (PORCINE) IN NACL 2-0.9 UNIT/ML-% IJ SOLN
INTRAMUSCULAR | Status: AC
  Filled 2012-07-28: qty 500

## 2012-07-28 NOTE — H&P (View-Only) (Signed)
HPI [name] is referred today by Dr. Lattie Haw for evaluation and consideration for ICD implantation. The patient is a very pleasant 72 year old woman with a history of severe left ventricular dysfunction, and has a remote history of MI though this is not well documented. She is a scar on her stress test. The patient has had worsening heart failure symptoms, and left bundle branch block. 2 months ago, her ejection fraction was 20-25%. She is been on maximal medical therapy with diuretics, carvedilol, and lisinopril. She denies frank syncope. Minimal peripheral edema is present. She has dyspnea with exertion. She is a difficult historian. No Known Allergies   Current Outpatient Prescriptions  Medication Sig Dispense Refill  . amLODipine (NORVASC) 5 MG tablet Take 1 tablet (5 mg total) by mouth daily.  30 tablet  6  . aspirin EC 81 MG tablet Take 81 mg by mouth daily.        . carvedilol (COREG) 25 MG tablet Take 1 tablet (25 mg total) by mouth 2 (two) times daily with a meal.  60 tablet  5  . furosemide (LASIX) 40 MG tablet Take 1 tablet (40 mg total) by mouth 2 (two) times daily.  60 tablet  5  . lisinopril (PRINIVIL,ZESTRIL) 40 MG tablet Take 1 tablet (40 mg total) by mouth daily.  30 tablet  5  . lovastatin (MEVACOR) 40 MG tablet Take 1 tablet (40 mg total) by mouth at bedtime.  30 tablet  6  . metoprolol (LOPRESSOR) 50 MG tablet Take 1 tablet (50 mg total) by mouth 2 (two) times daily.  60 tablet  5  . omeprazole (PRILOSEC) 20 MG capsule Take 20 mg by mouth daily as needed. For gas/heartburn       . spironolactone (ALDACTONE) 25 MG tablet Take 1 tablet (25 mg total) by mouth daily.  30 tablet  5   No current facility-administered medications for this visit.     Past Medical History  Diagnosis Date  . Arteriosclerotic cardiovascular disease (ASCVD)     Remote PTCA by patient report; LBBB; associated cardiomyopathy, presumed ischemic with EF 40-45% previously, 20% in 06/2009; h/o clinical  congestive heart failure; negative stress nuclear in 2009 with inferoseptal and apical scar  . Anemia     Hgb of 9-10  . Hyperlipidemia   . Breast cancer   . Arthritis   . Pneumonia 2012  . Hypertension     ROS:   All systems reviewed and negative except as noted in the HPI.   Past Surgical History  Procedure Laterality Date  . Mastectomy  1998    Left  . Total knee arthroplasty      Right; Dr.Harrison     No family history on file.   History   Social History  . Marital Status: Married    Spouse Name: N/A    Number of Children: N/A  . Years of Education: N/A   Occupational History  . Retired    Social History Main Topics  . Smoking status: Former Research scientist (life sciences)  . Smokeless tobacco: Current User     Comment: snuff  . Alcohol Use: No  . Drug Use: No  . Sexually Active: Not on file   Other Topics Concern  . Not on file   Social History Narrative  . No narrative on file     BP 120/60  Pulse 56  Ht 5\' 4"  (1.626 m)  Wt 153 lb 0.6 oz (69.418 kg)  BMI 26.26 kg/m2  SpO2 97%  Physical Exam:  Well appearing 72 year old woman,NAD HEENT: Unremarkable Neck:  7 cm JVD, no thyromegally Lymphatics:  No adenopathy Back:  No CVA tenderness Lungs:  Clear except for basilar rales, no wheezes, no rhonchi. HEART:  Regular rate rhythm, no murmurs, no rubs, no clicks, split S2 is present Abd:  soft, positive bowel sounds, no organomegally, no rebound, no guarding Ext:  2 plus pulses, no edema, no cyanosis, no clubbing Skin:  No rashes no nodules Neuro:  CN II through XII intact, motor grossly intact  EKG - normal sinus rhythm with left bundle branch block, QRS duration 176 ms.   Assess/Plan:

## 2012-07-28 NOTE — Interval H&P Note (Signed)
History and Physical Interval Note:  07/28/2012 11:03 AM  [name]  has presented today for surgery, with the diagnosis of Cardiomyapathy  The various methods of treatment have been discussed with the patient and family. After consideration of risks, benefits and other options for treatment, the patient has consented to  Procedure(s): BI-VENTRICULAR IMPLANTABLE CARDIOVERTER DEFIBRILLATOR  (CRT-D) (N/A) as a surgical intervention .  The patient's history has been reviewed, patient examined, no change in status, stable for surgery.  I have reviewed the patient's chart and labs.  Questions were answered to the patient's satisfaction.     Cristopher Peru

## 2012-07-28 NOTE — Op Note (Signed)
BiV ICD implant via the left subclavian vein without immediate complication. DFT<20 joules. [id number].

## 2012-07-29 ENCOUNTER — Ambulatory Visit (HOSPITAL_COMMUNITY): Payer: Medicare Other

## 2012-07-29 NOTE — Discharge Summary (Signed)
ELECTROPHYSIOLOGY DISCHARGE SUMMARY    Patient ID: [name],  MRN: [id number], DOB/AGE: 1940/09/27 72 y.o.  Admit date: 07/28/2012 Discharge date: 07/29/2012  Primary Care Physician: [name], MD Primary Cardiologist: Lattie Haw, MD  Primary Discharge Diagnosis:  1. Chronic systolic HF, NYHA class III, s/p BiV ICD for CRT and primary prevention SCD 2. Ischemic CM, EF 20% 3. LBBB  Secondary Discharge Diagnoses:  1. CAD s/p prior MI 2. HTN 3. Dyslipidemia 4. History of breast CA 5. History of anemia  Procedures This Admission:  1. BiV ICD implantation 07/28/2012  RA lead - Medtronic model 5076 45 cm active fixation pacing lead serial number [id number]  RV lead - Medtronic model 6935 58 cm active fixation defibrillation lead, serial number [id number]  LV lead - Medtronic model 4194 88 cm bipolar LV pacing lead, serial number [id number]  Device - Medtronic Viva XT CRT-D, BiV ICD serial number [id number]   History and Hospital Course:  72 year old woman with an ischemic cardiomyopathy, CAD s/p prior MI with longstanding large anterior scar and LBBB. Her ejection fraction has been low for many years, but most recently by echocardiogram was 20% despite maximal medical therapy with carvedilol and lisinopril. She has NYHA class III heart failure symptoms and in the setting of LBBB (QRS duration of 176 msec), she meets criteria for BiV ICD implantation for CRT and primary prevention SCD. She presented yesterday and underwent BiV ICD implantation. Ms. Smolinski tolerated this procedure well without any immediate complication. She remains hemodynamically stable and afebrile. Her chest xray shows stable lead placement without pneumothorax. Her device interrogation shows normal BiV ICD function with stable lead parameters/measurements. Her implant site is intact without significant bleeding or hematoma. She has been given discharge instructions including wound care and activity restrictions.  She will follow-up in 10 days for wound check. There were no changes made to her medications. She has been seen, examined and deemed stable for discharge today by Dr. Cristopher Peru.  Discharge Vitals: Blood pressure 160/72, pulse 59, temperature 98.3 F (36.8 C), temperature source Oral, resp. rate 18, height 5\' 4"  (1.626 m), weight 153 lb (69.4 kg), SpO2 95.00%.   Labs: None in last 7 days  Disposition:  The patient is being discharged in stable condition.  Follow-up:     Follow-up Information   Follow up with [facility] On 08/11/2012. (At 3:30 PM for wound check)    Contact information:   [address] [place] [address] [phone]      Follow up with Cristopher Peru, MD In 3 months. (Our office will schedule)    Contact information:   [id number] [place] [address] [phone]     Discharge Medications:    Medication List    TAKE these medications       amLODipine 5 MG tablet  Commonly known as:  NORVASC  Take 1 tablet (5 mg total) by mouth daily.     aspirin 325 MG tablet  Take 325 mg by mouth daily.     carvedilol 25 MG tablet  Commonly known as:  COREG  Take 1 tablet (25 mg total) by mouth 2 (two) times daily with a meal.     furosemide 40 MG tablet  Commonly known as:  LASIX  Take 1 tablet (40 mg total) by mouth 2 (two) times daily.     lisinopril 40 MG tablet  Commonly known as:  PRINIVIL,ZESTRIL  Take 1  tablet (40 mg total) by mouth daily.     metoprolol 50 MG tablet  Commonly known as:  LOPRESSOR  Take 1 tablet (50 mg total) by mouth 2 (two) times daily.     omeprazole 20 MG capsule  Commonly known as:  PRILOSEC  Take 20 mg by mouth daily as needed. For gas/heartburn     spironolactone 25 MG tablet  Commonly known as:  ALDACTONE  Take 1 tablet (25 mg total) by mouth daily.       Duration of Discharge Encounter: Greater than 30 minutes including physician time.  Signed, Ileene Hutchinson,  PA-C 07/29/2012, 10:28 AM

## 2012-07-29 NOTE — Progress Notes (Signed)
     Patient: Jolene Schimke Date of Encounter: 07/29/2012, 7:34 AM Admit date: 07/28/2012     Subjective  Ms. Rothermel reports incisional soreness but otherwise has no complaints this AM. She denies CP or SOB.   Objective  Physical Exam: Vitals: BP 160/72  Pulse 59  Temp(Src) 98.3 F (36.8 C) (Oral)  Resp 18  Ht 5\' 4"  (1.626 m)  Wt 153 lb (69.4 kg)  BMI 26.25 kg/m2  SpO2 95% General: Well developed, well appearing, thin 72 year old female in no acute distress. Neck: Supple. JVD not elevated. Lungs: Clear bilaterally to auscultation without wheezes, rales, or rhonchi. Breathing is unlabored. Heart: RRR S1 S2 with II/VI systolic murmur LSB and apex. No rub or gallop.  Abdomen: Soft, non-distended. Extremities: No clubbing or cyanosis. No edema.  Distal pedal pulses are 2+ and equal bilaterally. Neuro: Alert and oriented X 3. Moves all extremities spontaneously. No focal deficits.  Intake/Output:  Intake/Output Summary (Last 24 hours) at 07/29/12 0734 Last data filed at 07/28/12 1200  Gross per 24 hour  Intake    360 ml  Output      0 ml  Net    360 ml    Inpatient Medications:  . amLODipine  5 mg Oral Daily  . aspirin  325 mg Oral Daily  . carvedilol  25 mg Oral BID WC  . furosemide  40 mg Oral BID  . lisinopril  40 mg Oral Daily  . metoprolol  50 mg Oral BID  . pantoprazole  40 mg Oral Daily  . spironolactone  25 mg Oral Daily    Labs: No results found for this basename: NA, K, CL, CO2, GLUCOSE, BUN, CREATININE, CALCIUM, MG, PHOS,  in the last 72 hours No results found for this basename: WBC, NEUTROABS, HGB, HCT, MCV, PLT,  in the last 72 hours No results found for this basename: INR,  in the last 72 hours  Radiology/Studies: Chest x-ray: pending this AM  Telemetry: AV paced; no arrhythmias Device interrogation: performed by industry shows normal BiV ICD function    Assessment and Plan  1. ICM with chronic systolic HF and LBBB, now s/p BiV ICD insertion for CRT  and primary prevention SCD Ms. Schettler is doing well post implant. Wound intact without bleeding or hematoma. Normal BiV ICD function. CXR pending. If CXR shows stable lead placement without PTX, will DC home. Wound care, activity restrictions and follow-up reviewed.   Signed, Jacqualine Mau  EP Attending  Patient seen and examined. Agree with above. [place] for discharge with usual followup.  Mikle Bosworth.D.

## 2012-07-29 NOTE — Op Note (Signed)
NAMETONE, MILLAGE                 ACCOUNT NO.:  [id number]  MEDICAL RECORD NO.:  [id number]  LOCATION:  3W30C                        FACILITY:  [facility]  PHYSICIAN:  Champ Mungo. Lovena Le, MD    DATE OF BIRTH:  14-Jan-1941  DATE OF PROCEDURE:  07/28/2012 DATE OF DISCHARGE:                              OPERATIVE REPORT   PROCEDURE PERFORMED:  Insertion of a biventricular ICD with defibrillation threshold testing.  INTRODUCTION:  The patient is a 72 year old woman with a history of ischemic cardiomyopathy status post myocardial infarction in the remote past.  She has longstanding large anterior scar and left bundle-branch block.  Her ejection fraction has been low for many years, but most recently by echocardiogram was 20%.  She is on maximal medical therapy with carvedilol and lisinopril and diuretics.  She has class III heart failure symptoms and all in the setting of left bundle-branch block and now is referred for biventricular ICD implantation with all of the above and a QRS duration of 176 milliseconds.  PROCEDURE:  After informed consent was obtained, the patient was taken to the diagnostic EP lab in a fasting state.  After usual preparation and draping, intravenous fentanyl and midazolam was given for sedation. 30 mL of lidocaine was infiltrated into the left infraclavicular region. A 7-cm incision was carried out over this region and electrocautery was utilized to dissect down to the fascial plane.  The left subclavian vein was then punctured x3.  The Medtronic model 6935 58 cm active fixation defibrillation lead, serial number [id number] was advanced into the right ventricle and a Medtronic model 5076 45 cm active fixation pacing lead serial number [id number] was advanced to the right atrium. Mapping was carried out first in the right ventricle.  At the final site, the R-waves measured 17 mV.  The pacing impedance was 700 ohms and threshold 0.8 V at 0.5 milliseconds.  Attention  was then turned to placement of the atrial lead, which was placed in the anterolateral portion of the right atrium where P-waves measured 6 multivitamin, the pacing impedance was 600 ohms and threshold 0.5 V at 0.4 milliseconds with the lead actively fixed.  At this point, attention was then turned to placement of the left ventricular lead.  The coronary sinus guiding catheter was advanced along with a 6-French hexapolar EP catheter into the right atrium.  The coronary sinus was cannulated with modest amount of difficulty.  It was a up and down vertical coronary sinus. Venography was carried out.  It demonstrated a posterior vein as well as a lateral vein, which was fairly small.  The Medtronic model [id number] cm, pacing lead, serial number [id number] was first advanced into the lateral vein, however, it could only get about halfway or less base to apex, and in this location, the pacing threshold was quite high.  At this point, we decided to go down to the posterior vein, which was cannulated with modest difficulty, and angioplasty 0.14 guidewire advanced into the posterior vein.  The Medtronic model 4194 88 cm bipolar LV pacing lead, serial number [id number] was advanced over the angioplasty guidewire and into the posterior vein.  At a distance approximately  two-thirds from base to apex, there was satisfactory pacing thresholds.  It should be noted that throughout the LV, the pacing thresholds were higher than might have been expected.  With these satisfactory parameters, the LV lead was liberated from the guiding catheter in the usual manner.  The leads were secured to the subpectoral fascia with a figure-of-eight silk suture and the sewing sleeve was secured with silk suture.  Electrocautery was utilized to make subcutaneous pocket.  Antibiotic irrigation was utilized to irrigate the pocket.  Electrocautery was utilized to assure hemostasis.  The Medtronic Viva XT CRT-D, BiV ICD serial  number [id number] was connected to the atrial, RV, and LV leads and placed back in the subcutaneous pocket.  The pocket was irrigated with antibiotic irrigation and the incision was closed with 2-0 and 3-0 Vicryl.  At this point, I supervised defibrillation threshold testing.  I scrubbed out of the case and the patient was more deeply sedated under direct supervision with fentanyl and Versed.  VF was induced with a T- wave shock and a 20-joule shock was then delivered terminating ventricular fibrillation and restoring sinus rhythm.  No additional defibrillation threshold testing was carried out, and benzoin and Steri- Strips were painted on the skin.  A pressure dressing applied and the patient returned to her room in satisfactory condition.  COMPLICATIONS:  There were no immediate procedure complications.  RESULTS:  This demonstrates successful implantation of a Medtronic biventricular ICD in a patient with a longstanding ischemic cardiomyopathy and chronic systolic heart failure and left bundle-branch block despite maximal medical therapy with carvedilol and lisinopril.     Champ Mungo. Lovena Le, MD     GWT/MEDQ  D:  07/28/2012  T:  07/29/2012  Job:  [id number]  cc:   Cristopher Estimable. Lattie Haw, MD, [facility]

## 2012-07-30 ENCOUNTER — Telehealth: Payer: Self-pay

## 2012-07-30 NOTE — Telephone Encounter (Signed)
Pt states she is doing "pretty fair".  She states she does not have any questions or concerns.  She has all her meds.  She is aware of the date and time of her f/u appt at our office.  I told her to call if she has any questions or concerns.

## 2012-07-31 ENCOUNTER — Other Ambulatory Visit: Payer: Self-pay | Admitting: Cardiology

## 2012-08-11 ENCOUNTER — Other Ambulatory Visit: Payer: Self-pay | Admitting: Internal Medicine

## 2012-08-11 ENCOUNTER — Ambulatory Visit (INDEPENDENT_AMBULATORY_CARE_PROVIDER_SITE_OTHER): Payer: Medicare Other | Admitting: *Deleted

## 2012-08-11 ENCOUNTER — Encounter: Payer: Self-pay | Admitting: *Deleted

## 2012-08-11 DIAGNOSIS — I428 Other cardiomyopathies: Secondary | ICD-10-CM

## 2012-08-11 LAB — ICD DEVICE OBSERVATION
AL AMPLITUDE: 3.1 mv
HV IMPEDENCE: 60 Ohm
LV LEAD THRESHOLD: 1.25 V
RV LEAD AMPLITUDE: 20 mv
RV LEAD IMPEDENCE ICD: 399 Ohm
VENTRICULAR PACING ICD: 93.4 pct

## 2012-08-11 NOTE — Progress Notes (Signed)
Wound check defib in clinic. Battery longevity 8.8 years. Normal device function. ROV in 3 mths with GT.

## 2012-08-18 ENCOUNTER — Encounter (INDEPENDENT_AMBULATORY_CARE_PROVIDER_SITE_OTHER): Payer: Self-pay | Admitting: *Deleted

## 2012-08-19 ENCOUNTER — Encounter (INDEPENDENT_AMBULATORY_CARE_PROVIDER_SITE_OTHER): Payer: Self-pay

## 2012-08-22 ENCOUNTER — Encounter: Payer: Self-pay | Admitting: Internal Medicine

## 2012-10-07 ENCOUNTER — Telehealth: Payer: Self-pay | Admitting: *Deleted

## 2012-10-07 NOTE — Telephone Encounter (Signed)
PT IS SCHEDULED TO HAVE PRE-OP FOR CATARACT SURGERY ON 10/17/12. PT NEEDS A SURGICAL CLEARANCE LETTER.  SHE JUST HAD DEVICE PLACEMENT IN April AND IS SCHEDULED FOR F/U WITH DR Lovena Le 11/06/12.

## 2012-10-07 NOTE — Telephone Encounter (Signed)
Please advise if pt needs apt for this surgery clearance

## 2012-10-08 ENCOUNTER — Encounter: Payer: Self-pay | Admitting: *Deleted

## 2012-10-08 NOTE — Telephone Encounter (Signed)
Clearance letter printed, will acquire signature and call pt with official pick up once signed

## 2012-10-08 NOTE — Telephone Encounter (Signed)
No appointment necessary.

## 2012-10-10 NOTE — Telephone Encounter (Signed)
Placed letter up front for pt pick up, also mailed copy to pt address, pt aware via called [facility]

## 2012-11-06 ENCOUNTER — Ambulatory Visit (INDEPENDENT_AMBULATORY_CARE_PROVIDER_SITE_OTHER): Payer: Medicare Other | Admitting: Internal Medicine

## 2012-11-06 ENCOUNTER — Encounter: Payer: Self-pay | Admitting: Internal Medicine

## 2012-11-06 VITALS — BP 112/56 | Ht 64.0 in | Wt 153.0 lb

## 2012-11-06 DIAGNOSIS — Z95810 Presence of automatic (implantable) cardiac defibrillator: Secondary | ICD-10-CM

## 2012-11-06 DIAGNOSIS — I5022 Chronic systolic (congestive) heart failure: Secondary | ICD-10-CM

## 2012-11-06 LAB — ICD DEVICE OBSERVATION
AL THRESHOLD: 0.75 V
ATRIAL PACING ICD: 40.2 pct
RV LEAD AMPLITUDE: 20 mv
RV LEAD THRESHOLD: 1.25 V
VENTRICULAR PACING ICD: 96.8 pct

## 2012-11-06 NOTE — Patient Instructions (Addendum)
Your physician recommends that you schedule a follow-up appointment in: [place] ON October 20TH 2014

## 2012-11-06 NOTE — Assessment & Plan Note (Signed)
Her heart failure symptoms are class II. She will continue her current medical therapy.

## 2012-11-06 NOTE — Progress Notes (Signed)
HPI [name] returns today for followup. She is a very pleasant 72 year old woman with a history of hypertension, chronic systolic heart failure, and left bundle branch block, status post biventricular ICD implantation. She denies any recent ICD shocks. Her heart failure symptoms are improved. She is having problems with her vision secondary to cataracts. She is pending surgery. She denies chest pain, shortness of breath, peripheral edema, or syncope. No Known Allergies   Current Outpatient Prescriptions  Medication Sig Dispense Refill  . amLODipine (NORVASC) 5 MG tablet Take 1 tablet (5 mg total) by mouth daily.  30 tablet  6  . aspirin 325 MG tablet Take 325 mg by mouth daily.      . carvedilol (COREG) 25 MG tablet Take 1 tablet (25 mg total) by mouth 2 (two) times daily with a meal.  60 tablet  5  . COLCRYS 0.6 MG tablet Take 1 tablet by mouth daily.      . furosemide (LASIX) 40 MG tablet Take 1 tablet (40 mg total) by mouth 2 (two) times daily.  60 tablet  5  . lisinopril (PRINIVIL,ZESTRIL) 40 MG tablet Take 1 tablet (40 mg total) by mouth daily.  30 tablet  5  . lovastatin (MEVACOR) 40 MG tablet Take 1 tablet by mouth at bedtime.      . metoprolol (LOPRESSOR) 50 MG tablet Take 1 tablet (50 mg total) by mouth 2 (two) times daily.  60 tablet  5  . omeprazole (PRILOSEC) 20 MG capsule Take 20 mg by mouth daily. For gas/heartburn      . spironolactone (ALDACTONE) 25 MG tablet Take 1 tablet (25 mg total) by mouth daily.  30 tablet  5   No current facility-administered medications for this visit.     Past Medical History  Diagnosis Date  . Arteriosclerotic cardiovascular disease (ASCVD)     Remote PTCA by patient report; LBBB; associated cardiomyopathy, presumed ischemic with EF 40-45% previously, 20% in 06/2009; h/o clinical congestive heart failure; negative stress nuclear in 2009 with inferoseptal and apical scar  . Anemia     Hgb of 9-10  . Hyperlipidemia   . Breast cancer   .  Arthritis   . Pneumonia 2012  . Hypertension   . Dysrhythmia     LBBB    ROS:   All systems reviewed and negative except as noted in the HPI.   Past Surgical History  Procedure Laterality Date  . Mastectomy  1998    Left  . Total knee arthroplasty      Right; Dr.Harrison  . Defibrilator  07/28/2012    BIVENTRICULAR     No family history on file.   History   Social History  . Marital Status: Married    Spouse Name: N/A    Number of Children: N/A  . Years of Education: N/A   Occupational History  . Retired    Social History Main Topics  . Smoking status: Former Research scientist (life sciences)  . Smokeless tobacco: Current User    Types: Chew     Comment: snuff  . Alcohol Use: No  . Drug Use: No  . Sexually Active: Not on file   Other Topics Concern  . Not on file   Social History Narrative  . No narrative on file     BP 112/56  Ht 5\' 4"  (1.626 m)  Wt 153 lb (69.4 kg)  BMI 26.25 kg/m2  Physical Exam:  Well appearing  72 year old woman,NAD HEENT: Unremarkable Neck:  6 cm JVD,  no thyromegally Back:  No CVA tenderness Lungs:  Clear with no wheezes, rales, or rhonchi. HEART:  Regular rate rhythm, no murmurs, no rubs, no clicks Abd:  soft, positive bowel sounds, no organomegally, no rebound, no guarding Ext:  2 plus pulses, no edema, no cyanosis, no clubbing Skin:  No rashes no nodules Neuro:  CN II through XII intact, motor grossly intact   DEVICE  Normal device function.  See PaceArt for details.   Assess/Plan:

## 2012-11-06 NOTE — Assessment & Plan Note (Signed)
Her Medtronic biventricular ICD is working normally. We'll plan to recheck in several months.

## 2012-11-07 ENCOUNTER — Encounter: Payer: Self-pay | Admitting: Internal Medicine

## 2012-11-21 ENCOUNTER — Other Ambulatory Visit: Payer: Self-pay | Admitting: *Deleted

## 2012-11-21 MED ORDER — METOPROLOL TARTRATE 50 MG PO TABS: 50.0000 mg | ORAL_TABLET | Freq: Two times a day (BID) | ORAL | Status: DC

## 2012-11-25 ENCOUNTER — Other Ambulatory Visit: Payer: Self-pay | Admitting: *Deleted

## 2012-11-25 MED ORDER — LISINOPRIL 40 MG PO TABS: 40.0000 mg | ORAL_TABLET | Freq: Every day | ORAL | Status: DC

## 2012-11-26 ENCOUNTER — Other Ambulatory Visit: Payer: Self-pay | Admitting: *Deleted

## 2012-11-26 MED ORDER — SPIRONOLACTONE 25 MG PO TABS: 25.0000 mg | ORAL_TABLET | Freq: Every day | ORAL | Status: DC

## 2013-01-15 ENCOUNTER — Other Ambulatory Visit (HOSPITAL_COMMUNITY): Payer: Self-pay | Admitting: Internal Medicine

## 2013-01-15 DIAGNOSIS — Z139 Encounter for screening, unspecified: Secondary | ICD-10-CM

## 2013-01-19 ENCOUNTER — Other Ambulatory Visit: Payer: Self-pay | Admitting: Cardiology

## 2013-02-09 ENCOUNTER — Encounter: Payer: Medicare Other | Admitting: *Deleted

## 2013-02-19 ENCOUNTER — Encounter: Payer: Self-pay | Admitting: *Deleted

## 2013-02-23 ENCOUNTER — Ambulatory Visit (HOSPITAL_COMMUNITY)
Admission: RE | Admit: 2013-02-23 | Discharge: 2013-02-23 | Disposition: A | Discharge status: Home / Self Care | Payer: Medicare Other | Source: Ambulatory Visit | Attending: Internal Medicine | Admitting: Internal Medicine

## 2013-02-23 DIAGNOSIS — Z139 Encounter for screening, unspecified: Secondary | ICD-10-CM

## 2013-02-23 DIAGNOSIS — Z1231 Encounter for screening mammogram for malignant neoplasm of breast: Secondary | ICD-10-CM | POA: Insufficient documentation

## 2013-03-09 ENCOUNTER — Encounter: Payer: Self-pay | Admitting: Internal Medicine

## 2013-03-09 ENCOUNTER — Telehealth: Payer: Self-pay | Admitting: Internal Medicine

## 2013-03-09 ENCOUNTER — Ambulatory Visit (INDEPENDENT_AMBULATORY_CARE_PROVIDER_SITE_OTHER): Payer: Medicare Other | Admitting: *Deleted

## 2013-03-09 DIAGNOSIS — I5022 Chronic systolic (congestive) heart failure: Secondary | ICD-10-CM

## 2013-03-09 DIAGNOSIS — Z95810 Presence of automatic (implantable) cardiac defibrillator: Secondary | ICD-10-CM

## 2013-03-09 LAB — MDC_IDC_ENUM_SESS_TYPE_REMOTE
Battery Remaining Longevity: 104 mo
Brady Statistic AP VS Percent: 2.04 %
Brady Statistic RA Percent Paced: 41.35 %
Brady Statistic RV Percent Paced: 5.04 %
Date Time Interrogation Session: 20141117230924
HighPow Impedance: 209 Ohm
Lead Channel Impedance Value: 342 Ohm
Lead Channel Impedance Value: 380 Ohm
Lead Channel Impedance Value: 494 Ohm
Lead Channel Pacing Threshold Amplitude: 0.5 V
Lead Channel Pacing Threshold Amplitude: 0.625 V
Lead Channel Pacing Threshold Pulse Width: 0.4 ms
Lead Channel Pacing Threshold Pulse Width: 0.5 ms
Lead Channel Sensing Intrinsic Amplitude: 3.875 mV
Lead Channel Setting Pacing Amplitude: 1.75 V
Lead Channel Setting Pacing Amplitude: 2.5 V
Lead Channel Setting Pacing Pulse Width: 0.5 ms
Lead Channel Setting Sensing Sensitivity: 0.3 mV
Zone Setting Detection Interval: 360 ms

## 2013-03-09 NOTE — Telephone Encounter (Signed)
New message    Pt missed defib remote check.  They do not know how to do it.  Pls call back this pm after 4 and help the nephew with her remote check. Pt does not hear well.

## 2013-03-09 NOTE — Telephone Encounter (Signed)
Instructions given for remote transmissions.  Patient to send later this evening.

## 2013-03-24 ENCOUNTER — Encounter: Payer: Self-pay | Admitting: *Deleted

## 2013-05-23 ENCOUNTER — Other Ambulatory Visit: Payer: Self-pay | Admitting: Internal Medicine

## 2013-06-01 ENCOUNTER — Other Ambulatory Visit: Payer: Self-pay

## 2013-06-01 MED ORDER — LISINOPRIL 40 MG PO TABS: 40.0000 mg | ORAL_TABLET | Freq: Every day | ORAL | Status: DC

## 2013-06-11 ENCOUNTER — Ambulatory Visit (INDEPENDENT_AMBULATORY_CARE_PROVIDER_SITE_OTHER): Payer: Medicare HMO | Admitting: *Deleted

## 2013-06-11 DIAGNOSIS — I5022 Chronic systolic (congestive) heart failure: Secondary | ICD-10-CM

## 2013-06-11 DIAGNOSIS — I428 Other cardiomyopathies: Secondary | ICD-10-CM

## 2013-06-12 ENCOUNTER — Telehealth: Payer: Self-pay | Admitting: *Deleted

## 2013-06-12 LAB — MDC_IDC_ENUM_SESS_TYPE_REMOTE
Brady Statistic AP VS Percent: 1.6 %
Brady Statistic AS VP Percent: 72.09 %
Brady Statistic AS VS Percent: 1.77 %
Brady Statistic RV Percent Paced: 3.81 %
Date Time Interrogation Session: 20150219234812
HIGH POWER IMPEDANCE MEASURED VALUE: 68 Ohm
HighPow Impedance: 209 Ohm
Lead Channel Impedance Value: 342 Ohm
Lead Channel Impedance Value: 380 Ohm
Lead Channel Impedance Value: 456 Ohm
Lead Channel Impedance Value: 532 Ohm
Lead Channel Pacing Threshold Amplitude: 0.625 V
Lead Channel Pacing Threshold Pulse Width: 0.4 ms
Lead Channel Pacing Threshold Pulse Width: 0.5 ms
Lead Channel Sensing Intrinsic Amplitude: 3.75 mV
Lead Channel Setting Pacing Amplitude: 1.75 V
Lead Channel Setting Sensing Sensitivity: 0.3 mV
MDC IDC MSMT BATTERY REMAINING LONGEVITY: 100 mo
MDC IDC MSMT BATTERY VOLTAGE: 3.01 V
MDC IDC MSMT LEADCHNL LV IMPEDANCE VALUE: 760 Ohm
MDC IDC MSMT LEADCHNL RA PACING THRESHOLD AMPLITUDE: 0.5 V
MDC IDC MSMT LEADCHNL RV PACING THRESHOLD AMPLITUDE: 1.625 V
MDC IDC MSMT LEADCHNL RV PACING THRESHOLD PULSEWIDTH: 0.4 ms
MDC IDC MSMT LEADCHNL RV SENSING INTR AMPL: 31.625 mV
MDC IDC SET LEADCHNL LV PACING PULSEWIDTH: 0.5 ms
MDC IDC SET LEADCHNL RA PACING AMPLITUDE: 1.5 V
MDC IDC SET LEADCHNL RV PACING AMPLITUDE: 3.25 V
MDC IDC SET LEADCHNL RV PACING PULSEWIDTH: 0.4 ms
MDC IDC SET ZONE DETECTION INTERVAL: 350 ms
MDC IDC SET ZONE DETECTION INTERVAL: 360 ms
MDC IDC SET ZONE DETECTION INTERVAL: 360 ms
MDC IDC STAT BRADY AP VP PERCENT: 24.54 %
MDC IDC STAT BRADY RA PERCENT PACED: 26.15 %
Zone Setting Detection Interval: 270 ms

## 2013-06-12 MED ORDER — LISINOPRIL 40 MG PO TABS: 40.0000 mg | ORAL_TABLET | Freq: Every day | ORAL | Status: DC

## 2013-06-12 NOTE — Telephone Encounter (Signed)
Fax [facility] f [phone] lisinopril 40 mg #30

## 2013-06-12 NOTE — Telephone Encounter (Signed)
Noted pt overdue to see Dr. Domenic Polite per last seen Dr Jeri Lager, sent refill with instructions for pt to call office to schedule with one month to last until pt can call into schedule, also left vm to advise the same

## 2013-06-17 ENCOUNTER — Encounter: Payer: Self-pay | Admitting: *Deleted

## 2013-07-03 ENCOUNTER — Emergency Department (HOSPITAL_COMMUNITY): Payer: Medicare HMO

## 2013-07-03 ENCOUNTER — Observation Stay (HOSPITAL_COMMUNITY): Payer: Medicare HMO

## 2013-07-03 ENCOUNTER — Encounter (HOSPITAL_COMMUNITY): Payer: Self-pay | Admitting: Emergency Medicine

## 2013-07-03 ENCOUNTER — Observation Stay (HOSPITAL_COMMUNITY)
Admission: EM | Admit: 2013-07-03 | Discharge: 2013-07-06 | Disposition: A | Discharge status: Home / Self Care | Payer: Medicare HMO | Attending: Internal Medicine | Admitting: Internal Medicine

## 2013-07-03 DIAGNOSIS — R5381 Other malaise: Secondary | ICD-10-CM | POA: Insufficient documentation

## 2013-07-03 DIAGNOSIS — M25439 Effusion, unspecified wrist: Secondary | ICD-10-CM

## 2013-07-03 DIAGNOSIS — Z8701 Personal history of pneumonia (recurrent): Secondary | ICD-10-CM | POA: Insufficient documentation

## 2013-07-03 DIAGNOSIS — M2550 Pain in unspecified joint: Secondary | ICD-10-CM | POA: Insufficient documentation

## 2013-07-03 DIAGNOSIS — I5022 Chronic systolic (congestive) heart failure: Secondary | ICD-10-CM | POA: Diagnosis present

## 2013-07-03 DIAGNOSIS — M129 Arthropathy, unspecified: Secondary | ICD-10-CM | POA: Insufficient documentation

## 2013-07-03 DIAGNOSIS — I10 Essential (primary) hypertension: Secondary | ICD-10-CM | POA: Diagnosis present

## 2013-07-03 DIAGNOSIS — M25539 Pain in unspecified wrist: Secondary | ICD-10-CM | POA: Diagnosis present

## 2013-07-03 DIAGNOSIS — R079 Chest pain, unspecified: Secondary | ICD-10-CM

## 2013-07-03 DIAGNOSIS — I7090 Unspecified atherosclerosis: Secondary | ICD-10-CM

## 2013-07-03 DIAGNOSIS — Z853 Personal history of malignant neoplasm of breast: Secondary | ICD-10-CM | POA: Insufficient documentation

## 2013-07-03 DIAGNOSIS — Z87891 Personal history of nicotine dependence: Secondary | ICD-10-CM | POA: Insufficient documentation

## 2013-07-03 DIAGNOSIS — E785 Hyperlipidemia, unspecified: Secondary | ICD-10-CM | POA: Diagnosis present

## 2013-07-03 DIAGNOSIS — M069 Rheumatoid arthritis, unspecified: Secondary | ICD-10-CM | POA: Diagnosis present

## 2013-07-03 DIAGNOSIS — R5383 Other fatigue: Secondary | ICD-10-CM

## 2013-07-03 DIAGNOSIS — I2510 Atherosclerotic heart disease of native coronary artery without angina pectoris: Secondary | ICD-10-CM | POA: Insufficient documentation

## 2013-07-03 DIAGNOSIS — D649 Anemia, unspecified: Secondary | ICD-10-CM

## 2013-07-03 DIAGNOSIS — Z95810 Presence of automatic (implantable) cardiac defibrillator: Secondary | ICD-10-CM | POA: Diagnosis present

## 2013-07-03 DIAGNOSIS — Z7982 Long term (current) use of aspirin: Secondary | ICD-10-CM | POA: Insufficient documentation

## 2013-07-03 DIAGNOSIS — E876 Hypokalemia: Secondary | ICD-10-CM | POA: Diagnosis present

## 2013-07-03 DIAGNOSIS — R197 Diarrhea, unspecified: Secondary | ICD-10-CM | POA: Insufficient documentation

## 2013-07-03 DIAGNOSIS — D638 Anemia in other chronic diseases classified elsewhere: Secondary | ICD-10-CM | POA: Diagnosis present

## 2013-07-03 DIAGNOSIS — R0789 Other chest pain: Principal | ICD-10-CM | POA: Insufficient documentation

## 2013-07-03 DIAGNOSIS — Z79899 Other long term (current) drug therapy: Secondary | ICD-10-CM | POA: Insufficient documentation

## 2013-07-03 DIAGNOSIS — I499 Cardiac arrhythmia, unspecified: Secondary | ICD-10-CM | POA: Insufficient documentation

## 2013-07-03 HISTORY — DX: Presence of automatic (implantable) cardiac defibrillator: Z95.810

## 2013-07-03 HISTORY — DX: Unspecified hearing loss, unspecified ear: H91.90

## 2013-07-03 LAB — BASIC METABOLIC PANEL
BUN: 14 mg/dL (ref 6–23)
CALCIUM: 9.1 mg/dL (ref 8.4–10.5)
CO2: 32 meq/L (ref 19–32)
CREATININE: 0.62 mg/dL (ref 0.50–1.10)
Chloride: 97 mEq/L (ref 96–112)
GFR calc Af Amer: >90 mL/min (ref >90)
GFR calc non Af Amer: 87 mL/min — ABNORMAL LOW (ref >90)
GLUCOSE: 123 mg/dL — AB (ref 70–99)
Potassium: 2.6 mEq/L — CL (ref 3.7–5.3)
Sodium: 143 mEq/L (ref 137–147)

## 2013-07-03 LAB — TROPONIN I

## 2013-07-03 LAB — CBC
HCT: 27.6 % — ABNORMAL LOW (ref 36.0–46.0)
HCT: 26.8 % — ABNORMAL LOW (ref 36.0–46.0)
Hemoglobin: 8.5 g/dL — ABNORMAL LOW (ref 12.0–15.0)
Hemoglobin: 8.3 g/dL — ABNORMAL LOW (ref 12.0–15.0)
MCH: 21.9 pg — ABNORMAL LOW (ref 26.0–34.0)
MCH: 22.2 pg — AB (ref 26.0–34.0)
MCHC: 30.8 g/dL (ref 30.0–36.0)
MCHC: 31 g/dL (ref 30.0–36.0)
MCV: 71.1 fL — ABNORMAL LOW (ref 78.0–100.0)
MCV: 71.7 fL — AB (ref 78.0–100.0)
PLATELETS: 315 10*3/uL (ref 150–400)
Platelets: 307 K/uL (ref 150–400)
RBC: 3.88 MIL/uL (ref 3.87–5.11)
RBC: 3.74 MIL/uL — ABNORMAL LOW (ref 3.87–5.11)
RDW: 19 % — ABNORMAL HIGH (ref 11.5–15.5)
RDW: 19.2 % — AB (ref 11.5–15.5)
WBC: 5.7 K/uL (ref 4.0–10.5)
WBC: 4.8 10*3/uL (ref 4.0–10.5)

## 2013-07-03 LAB — SEDIMENTATION RATE: Sed Rate: 134 mm/hr — ABNORMAL HIGH (ref 0–22)

## 2013-07-03 LAB — CREATININE, SERUM
Creatinine, Ser: 0.66 mg/dL (ref 0.50–1.10)
GFR calc Af Amer: >90 mL/min (ref >90)
GFR calc non Af Amer: 85 mL/min — ABNORMAL LOW (ref >90)

## 2013-07-03 LAB — I-STAT TROPONIN, ED: Troponin i, poc: 0.02 ng/mL (ref 0.00–0.08)

## 2013-07-03 LAB — POC OCCULT BLOOD, ED: Fecal Occult Bld: NEGATIVE

## 2013-07-03 MED ORDER — CARVEDILOL 25 MG PO TABS
25.0000 mg | ORAL_TABLET | Freq: Two times a day (BID) | ORAL | Status: DC
  Administered 2013-07-03 – 2013-07-06 (×6): 25 mg via ORAL
  Filled 2013-07-03 (×8): qty 1

## 2013-07-03 MED ORDER — ENOXAPARIN SODIUM 40 MG/0.4ML ~~LOC~~ SOLN
40.0000 mg | SUBCUTANEOUS | Status: DC
  Administered 2013-07-03 – 2013-07-05 (×3): 40 mg via SUBCUTANEOUS
  Filled 2013-07-03 (×4): qty 0.4

## 2013-07-03 MED ORDER — ATORVASTATIN CALCIUM 40 MG PO TABS: 40.0000 mg | ORAL_TABLET | Freq: Every day | ORAL | Status: DC

## 2013-07-03 MED ORDER — POTASSIUM CHLORIDE CRYS ER 20 MEQ PO TBCR
40.0000 meq | EXTENDED_RELEASE_TABLET | Freq: Once | ORAL | Status: AC
Start: 2013-07-03 — End: 2013-07-03
  Administered 2013-07-03: 40 meq via ORAL
  Filled 2013-07-03: qty 2

## 2013-07-03 MED ORDER — PANTOPRAZOLE SODIUM 40 MG PO TBEC
40.0000 mg | DELAYED_RELEASE_TABLET | Freq: Every day | ORAL | Status: DC
  Administered 2013-07-03 – 2013-07-06 (×4): 40 mg via ORAL
  Filled 2013-07-03 (×3): qty 1

## 2013-07-03 MED ORDER — SODIUM CHLORIDE 0.9 % IV SOLN: 250.0000 mL | INTRAVENOUS | Status: DC | PRN

## 2013-07-03 MED ORDER — ALLOPURINOL 100 MG PO TABS
100.0000 mg | ORAL_TABLET | Freq: Every day | ORAL | Status: DC
  Administered 2013-07-03 – 2013-07-06 (×4): 100 mg via ORAL
  Filled 2013-07-03 (×4): qty 1

## 2013-07-03 MED ORDER — LISINOPRIL 40 MG PO TABS
40.0000 mg | ORAL_TABLET | Freq: Every day | ORAL | Status: DC
  Administered 2013-07-03 – 2013-07-06 (×4): 40 mg via ORAL
  Filled 2013-07-03 (×5): qty 1

## 2013-07-03 MED ORDER — OXYCODONE HCL 5 MG PO TABS
5.0000 mg | ORAL_TABLET | ORAL | Status: DC | PRN
  Administered 2013-07-05 – 2013-07-06 (×2): 5 mg via ORAL
  Filled 2013-07-03 (×2): qty 1

## 2013-07-03 MED ORDER — ONDANSETRON HCL 4 MG PO TABS: 4.0000 mg | ORAL_TABLET | Freq: Four times a day (QID) | ORAL | Status: DC | PRN

## 2013-07-03 MED ORDER — ACETAMINOPHEN 325 MG PO TABS: 650.0000 mg | ORAL_TABLET | Freq: Four times a day (QID) | ORAL | Status: DC | PRN

## 2013-07-03 MED ORDER — ACETAMINOPHEN 650 MG RE SUPP: 650.0000 mg | Freq: Four times a day (QID) | RECTAL | Status: DC | PRN

## 2013-07-03 MED ORDER — ASPIRIN EC 81 MG PO TBEC
81.0000 mg | DELAYED_RELEASE_TABLET | Freq: Every day | ORAL | Status: DC
  Administered 2013-07-03 – 2013-07-06 (×4): 81 mg via ORAL
  Filled 2013-07-03 (×4): qty 1

## 2013-07-03 MED ORDER — ASPIRIN 81 MG PO TABS: 81.0000 mg | ORAL_TABLET | Freq: Every day | ORAL | Status: DC

## 2013-07-03 MED ORDER — AMLODIPINE BESYLATE 5 MG PO TABS
5.0000 mg | ORAL_TABLET | Freq: Every day | ORAL | Status: DC
  Administered 2013-07-03 – 2013-07-06 (×4): 5 mg via ORAL
  Filled 2013-07-03 (×4): qty 1

## 2013-07-03 MED ORDER — SODIUM CHLORIDE 0.9 % IJ SOLN
3.0000 mL | Freq: Two times a day (BID) | INTRAMUSCULAR | Status: DC
  Administered 2013-07-03 – 2013-07-05 (×3): 3 mL via INTRAVENOUS

## 2013-07-03 MED ORDER — ATORVASTATIN CALCIUM 40 MG PO TABS
40.0000 mg | ORAL_TABLET | Freq: Every day | ORAL | Status: DC
  Administered 2013-07-03 – 2013-07-05 (×3): 40 mg via ORAL
  Filled 2013-07-03 (×4): qty 1

## 2013-07-03 MED ORDER — SODIUM CHLORIDE 0.9 % IJ SOLN
3.0000 mL | Freq: Two times a day (BID) | INTRAMUSCULAR | Status: DC
  Administered 2013-07-03 – 2013-07-06 (×5): 3 mL via INTRAVENOUS

## 2013-07-03 MED ORDER — SODIUM CHLORIDE 0.9 % IJ SOLN: 3.0000 mL | INTRAMUSCULAR | Status: DC | PRN

## 2013-07-03 MED ORDER — POTASSIUM CHLORIDE CRYS ER 20 MEQ PO TBCR
40.0000 meq | EXTENDED_RELEASE_TABLET | Freq: Once | ORAL | Status: AC
  Administered 2013-07-03: 40 meq via ORAL
  Filled 2013-07-03: qty 2

## 2013-07-03 MED ORDER — FUROSEMIDE 40 MG PO TABS
40.0000 mg | ORAL_TABLET | Freq: Two times a day (BID) | ORAL | Status: DC
  Administered 2013-07-03 – 2013-07-06 (×6): 40 mg via ORAL
  Filled 2013-07-03 (×8): qty 1

## 2013-07-03 MED ORDER — ONDANSETRON HCL 4 MG/2ML IJ SOLN: 4.0000 mg | Freq: Four times a day (QID) | INTRAMUSCULAR | Status: DC | PRN

## 2013-07-03 NOTE — ED Provider Notes (Signed)
CSN: [id number]     Arrival date & time 07/03/13  1123 History   First MD Initiated Contact with Patient 07/03/13 1254     Chief Complaint  Patient presents with  . Chest Pain  . Weakness     (Consider location/radiation/quality/duration/timing/severity/associated sxs/prior Treatment) HPI Comments: Patient is a 73 year old female with history of arteriosclerotic cardiovascular disease, anemia, hyperlipidemia, breast cancer, hypertension who presents today with generalized weakness and chest pain. She reports that for the past 10 days she has been having intermitted chest tightness at night. She denies any associated symptoms including diaphoresis and chest pain. She feels generally week which has been gradually worsening. She complains of bilateral wrist and foot pain. She has been diagnosed with gout by her PCP. She is taking allopurinol and Colcrys for this without relief. This pain has been worsening for months. She started the Colcrys recently and has been having non bloody diarrhea since that time. She denies abdominal pain, nausea, vomiting, or black and tarry stools.   Patient is a 73 y.o. female presenting with chest pain and weakness. The history is provided by the patient and the EMS personnel. No language interpreter was used.  Chest Pain Associated symptoms: fatigue and weakness   Associated symptoms: no abdominal pain, no cough, no fever, no nausea, no shortness of breath and not vomiting   Weakness Associated symptoms include chest pain, fatigue and weakness. Pertinent negatives include no abdominal pain, chills, coughing, fever, nausea or vomiting.    Past Medical History  Diagnosis Date  . Arteriosclerotic cardiovascular disease (ASCVD)     Remote PTCA by patient report; LBBB; associated cardiomyopathy, presumed ischemic with EF 40-45% previously, 20% in 06/2009; h/o clinical congestive heart failure; negative stress nuclear in 2009 with inferoseptal and apical scar  . Anemia      Hgb of 9-10  . Hyperlipidemia   . Breast cancer   . Arthritis   . Pneumonia 2012  . Hypertension   . Dysrhythmia     LBBB   Past Surgical History  Procedure Laterality Date  . Mastectomy  1998    Left  . Total knee arthroplasty      Right; Dr.Harrison  . Defibrilator  07/28/2012    BIVENTRICULAR   No family history on file. History  Substance Use Topics  . Smoking status: Former Research scientist (life sciences)  . Smokeless tobacco: Current User    Types: Chew     Comment: snuff  . Alcohol Use: No   OB History   Grav Para Term Preterm Abortions TAB SAB Ect Mult Living                 Review of Systems  Constitutional: Positive for fatigue. Negative for fever and chills.  Respiratory: Positive for chest tightness. Negative for cough and shortness of breath.   Cardiovascular: Positive for chest pain.  Gastrointestinal: Positive for diarrhea. Negative for nausea, vomiting, abdominal pain, blood in stool and anal bleeding.  Neurological: Positive for weakness.  All other systems reviewed and are negative.      Allergies  Review of patient's allergies indicates no known allergies.  Home Medications   Current Outpatient Rx  Name  Route  Sig  Dispense  Refill  . amLODipine (NORVASC) 5 MG tablet      TAKE 1 TABLET (5 MG TOTAL) BY MOUTH DAILY.   30 tablet   6   . aspirin 325 MG tablet   Oral   Take 325 mg by mouth daily.         [name]  carvedilol (COREG) 25 MG tablet   Oral   Take 1 tablet (25 mg total) by mouth 2 (two) times daily with a meal.   60 tablet   5   . COLCRYS 0.6 MG tablet   Oral   Take 1 tablet by mouth daily.         . furosemide (LASIX) 40 MG tablet   Oral   Take 1 tablet (40 mg total) by mouth 2 (two) times daily.   60 tablet   5   . lisinopril (PRINIVIL,ZESTRIL) 40 MG tablet   Oral   Take 1 tablet (40 mg total) by mouth daily.   30 tablet   0     PT OVERDUE FOR FOLLOW UP APT WITH DR [place] ...   . lovastatin (MEVACOR) 40 MG tablet   Oral    Take 1 tablet by mouth at bedtime.         . lovastatin (MEVACOR) 40 MG tablet      TAKE 1 TABLET AT BEDTIME   30 tablet   6   . metoprolol (LOPRESSOR) 50 MG tablet      TAKE 1 TABLET (50 MG TOTAL) BY MOUTH 2 (TWO) TIMES DAILY.   60 tablet   5   . omeprazole (PRILOSEC) 20 MG capsule   Oral   Take 20 mg by mouth daily. For gas/heartburn         . spironolactone (ALDACTONE) 25 MG tablet   Oral   Take 1 tablet (25 mg total) by mouth daily.   30 tablet   5    BP 133/58  Pulse 76  Temp(Src) 99.3 F (37.4 C) (Oral)  Resp 18  SpO2 100% Physical Exam  Nursing note and vitals reviewed. Constitutional: She is oriented to person, place, and time. She appears well-developed and well-nourished. No distress.  HENT:  Head: Normocephalic and atraumatic.  Right Ear: External ear normal.  Left Ear: External ear normal.  Nose: Nose normal.  Mouth/Throat: Oropharynx is clear and moist.  Eyes: Conjunctivae are normal.  Neck: Normal range of motion.  Cardiovascular: Normal rate, regular rhythm, normal heart sounds, intact distal pulses and normal pulses.   Pulses:      Radial pulses are 2+ on the right side, and 2+ on the left side.  Pulmonary/Chest: Effort normal and breath sounds normal. No stridor. No respiratory distress. She has no wheezes. She has no rales.  Abdominal: Soft. She exhibits no distension. There is no tenderness.  Genitourinary: Rectum normal. Guaiac negative stool.  Musculoskeletal: Normal range of motion.  Swelling to wrists bilaterally. TTP. Compartment soft. Neurovascularly intact.   Neurological: She is alert and oriented to person, place, and time. She has normal strength.  Skin: Skin is warm and dry. She is not diaphoretic. No erythema.  Psychiatric: She has a normal mood and affect. Her behavior is normal.    ED Course  Procedures (including critical care time) Labs Review Labs Reviewed  CBC - Abnormal; Notable for the following:    Hemoglobin 8.5  (*)    HCT 27.6 (*)    MCV 71.1 (*)    MCH 21.9 (*)    RDW 19.0 (*)    All other components within normal limits  BASIC METABOLIC PANEL - Abnormal; Notable for the following:    Potassium 2.6 (*)    Glucose, Bld 123 (*)    GFR calc non Af Amer 87 (*)    All other components within normal limits  I-STAT  TROPOININ, ED   Imaging Review Dg Chest 2 View  07/03/2013   CLINICAL DATA:  Cough and congestion  EXAM: CHEST  2 VIEW  COMPARISON:  07/29/2012  FINDINGS: Cardiac shadow is within normal limits. A defibrillator is again noted. Mild interstitial changes are seen without focal confluent infiltrate. Postsurgical changes are noted in the left axilla.  IMPRESSION: No acute abnormality noted.   Electronically Signed   By: Inez Catalina M.D.   On: 07/03/2013 13:14     EKG Interpretation None      MDM   Final diagnoses:  Chest pain  Arteriosclerotic cardiovascular disease (ASCVD)  Biventricular ICD (implantable cardioverter-defibrillator) in place  Chronic systolic heart failure  Hypertension  Hypokalemia  Pain and swelling of wrist   Patient presents to ED with atypical chest pain and tightness as well as chronic arthralgias. Labs show hgb of 8.5 and potassium of 2.6. Hemoccult negative. No ekg changes. Potassium given in ED. Patient is high risk and will need observation admission. Discussed this with Dr. Tawnya Crook who agrees with plan. Patient was admitted to medicine and admission is appreciated. Vital signs stable at this time. Patient / Family / Caregiver informed of clinical course, understand medical decision-making process, and agree with plan.     Elwyn Lade, PA-C 07/03/13 [phone]

## 2013-07-03 NOTE — ED Notes (Signed)
Pt states she has been having tightness in her chest "lately" not giving a date or day and feels weak all over. Also reports pain in bilateral wrists for a couple months. Was seen by PCP for that and had been told it was gout but has not gotten any better. Was referred to ortho and has not seen them yet.

## 2013-07-03 NOTE — H&P (Signed)
[facility] History and Physical  PAISLEY SHEFFLER [id number] DOB: 1940/09/09 DOA: 07/03/2013  Referring physician:  PCP: [name], MD   Chief Complaint: Chest pain, wrist pain  HPI: BAYLEI SPEAKES is a 73 y.o. female with a past medical history of hypertension, chronic systolic heart failure, left bundle branch block, status post biventricular ICD implantation, presenting to the emergency room with complaints of chest pain in joint pain. She reports having chest pain over the past week characterized as tight, pressure like, located in the retrosternal region, nonradiating, not precipitated by physical exertion. States symptoms lasted several seconds and resolved spontaneously. She denies associated nausea, vomiting, shortness of breath, palpitations, dizziness, lightheadedness, cough, sputum production, or syncope. Patient also complains of bilateral joint pain mainly affecting bilateral wrists and ankles. She states seen her primary care physician who diagnosed her with gout and prescribed her a "pill." She states that her wrist pain has improved her ability to perform many activities of daily living.  Initial lab work in the emergency room showed a troponin of 0.02 with a potassium of 2.6.                                                                                                                                                                                                                                                  Review of Systems:  Constitutional:  No weight loss, night sweats, Fevers, chills, fatigue.  HEENT:  No headaches, Difficulty swallowing,Tooth/dental problems,Sore throat,  No sneezing, itching, ear ache, nasal congestion, post nasal drip,  Cardio-vascular:  Positive for chest pain, denies Orthopnea, PND, swelling in lower extremities, anasarca, dizziness, palpitations  GI:  No heartburn, indigestion, abdominal pain, nausea, vomiting, diarrhea, change in bowel  habits, loss of appetite  Resp:  No shortness of breath with exertion or at rest. No excess mucus, no productive cough, No non-productive cough, No coughing up of blood.No change in color of mucus.No wheezing.No chest wall deformity  Skin:  no rash or lesions.  GU:  no dysuria, change in color of urine, no urgency or frequency. No flank pain.  Musculoskeletal:  Positive for multiple joint swelling. No decreased range of motion. No back pain.  Psych:  No change in mood or affect. No depression or anxiety. No memory loss.   Past Medical History  Diagnosis Date  . Arteriosclerotic cardiovascular  disease (ASCVD)     Remote PTCA by patient report; LBBB; associated cardiomyopathy, presumed ischemic with EF 40-45% previously, 20% in 06/2009; h/o clinical congestive heart failure; negative stress nuclear in 2009 with inferoseptal and apical scar  . Anemia     Hgb of 9-10  . Hyperlipidemia   . Breast cancer   . Arthritis   . Pneumonia 2012  . Hypertension   . Dysrhythmia     LBBB   Past Surgical History  Procedure Laterality Date  . Mastectomy  1998    Left  . Total knee arthroplasty      Right; Dr.Harrison  . Defibrilator  07/28/2012    BIVENTRICULAR   Social History:  reports that she has quit smoking. Her smokeless tobacco use includes Chew. She reports that she does not drink alcohol or use illicit drugs.  No Known Allergies  No family history on file.   Prior to Admission medications   Medication Sig Start Date End Date Taking? Authorizing Provider  allopurinol (ZYLOPRIM) 100 MG tablet Take 100 mg by mouth daily.   Yes Historical Provider, MD  amLODipine (NORVASC) 5 MG tablet Take 5 mg by mouth daily.   Yes Historical Provider, MD  aspirin 81 MG tablet Take 81 mg by mouth daily.   Yes Historical Provider, MD  carvedilol (COREG) 25 MG tablet Take 1 tablet (25 mg total) by mouth 2 (two) times daily with a meal. 05/20/12  Yes Lendon Colonel, NP  COLCRYS 0.6 MG tablet Take 1  tablet by mouth 2 (two) times daily.  10/16/12  Yes Historical Provider, MD  furosemide (LASIX) 40 MG tablet Take 40 mg by mouth 2 (two) times daily.   Yes Historical Provider, MD  ibuprofen (ADVIL,MOTRIN) 200 MG tablet Take 200 mg by mouth every 6 (six) hours as needed for fever or headache.   Yes Historical Provider, MD  lisinopril (PRINIVIL,ZESTRIL) 40 MG tablet Take 1 tablet (40 mg total) by mouth daily. 06/12/13  Yes Evans Lance, MD  lovastatin (MEVACOR) 40 MG tablet Take 1 tablet by mouth at bedtime. 10/21/12  Yes Historical Provider, MD  metoprolol (LOPRESSOR) 50 MG tablet Take 50 mg by mouth 2 (two) times daily.   Yes Historical Provider, MD  omeprazole (PRILOSEC) 20 MG capsule Take 20 mg by mouth daily. For gas/heartburn   Yes Historical Provider, MD   Physical Exam: Filed Vitals:   07/03/13 1545  BP: 127/47  Pulse: 66  Resp: 17    BP 127/47  Pulse 66  Resp 17  SpO2 96%  General:  Appears calm and comfortable, in no acute distress Eyes: PERRL, normal lids, irises & conjunctiva ENT: grossly normal hearing, lips & tongue Neck: no LAD, masses or thyromegaly Cardiovascular: RRR, no m/r/g. No LE edema. Telemetry: SR, no arrhythmias  Respiratory: She has bibasilar crackles noted on lung exam, otherwise clear no wheezing rhonchi or rales. Normal respiratory effort. Abdomen: soft, ntnd Skin: no rash or induration seen on limited exam Musculoskeletal: She has swelling involving bilateral wrists with limited range of motion, also pain with passive and active motion to bilateral feet with associated ankle swelling as well. I do not no localized heat. Psychiatric: grossly normal mood and affect, speech fluent and appropriate Neurologic: grossly non-focal.          Labs on Admission:  Basic Metabolic Panel:  Recent Labs Lab 07/03/13 1200  NA 143  K 2.6*  CL 97  CO2 32  GLUCOSE 123*  BUN 14  CREATININE 0.62  CALCIUM 9.1   Liver Function Tests: No results found for this  basename: AST, ALT, ALKPHOS, BILITOT, PROT, ALBUMIN,  in the last 168 hours No results found for this basename: LIPASE, AMYLASE,  in the last 168 hours No results found for this basename: AMMONIA,  in the last 168 hours CBC:  Recent Labs Lab 07/03/13 1200  WBC 5.7  HGB 8.5*  HCT 27.6*  MCV 71.1*  PLT 307   Cardiac Enzymes: No results found for this basename: CKTOTAL, CKMB, CKMBINDEX, TROPONINI,  in the last 168 hours  BNP (last 3 results) No results found for this basename: PROBNP,  in the last 8760 hours CBG: No results found for this basename: GLUCAP,  in the last 168 hours  Radiological Exams on Admission: Dg Chest 2 View  07/03/2013   CLINICAL DATA:  Cough and congestion  EXAM: CHEST  2 VIEW  COMPARISON:  07/29/2012  FINDINGS: Cardiac shadow is within normal limits. A defibrillator is again noted. Mild interstitial changes are seen without focal confluent infiltrate. Postsurgical changes are noted in the left axilla.  IMPRESSION: No acute abnormality noted.   Electronically Signed   By: Inez Catalina M.D.   On: 07/03/2013 13:14    EKG: Ordered on admission, however not on Epic, has been reordered.    Assessment/Plan Active Problems:   Chest pain   Hypokalemia   Pain and swelling of wrist   HYPERLIPIDEMIA   Chronic systolic heart failure   Anemia   Hypertension   Biventricular ICD (implantable cardioverter-defibrillator) in place   1. Chest pain. Patient presenting with trace pain having atypical features. Initial chest x-ray showing no acute abnormalities. Troponin negative, awaiting EKG at time of this dictation. Per medical record she has a chronic left bundle branch block. Will admit her to telemetry, cycle troponin's x3 sets overnight, obtain a transthoracic echocardiogram in a.m. Meanwhile will continue antiplatelet therapy with aspirin. Also continue Coreg and lisinopril. She is chest pain-free during my encounter in the emergency room. 2. Chronic combined systolic  and diastolic congestive heart failure. Her last transthoracic echocardiogram was performed on 05/23/2012 which showed an estimated ejection fraction of 25% with akinesis of the basal mid inferior lateral, inferior and inferior septal myocardium. Grade 2 diastolic dysfunction noted. Patient currently appears compensated. We'll continue diuretic therapy with Lasix 40 mg by mouth twice daily. Status post defibrillator placement.  3. Bilateral joint pain. Patient complaining of bilateral joint pain mostly involving wrists and ankles with associated swelling. Will obtain a sed rate, CRP, rheumatoid factor, antinuclear antibody, anti--CCP. Will also check bilateral wrist x-rays given degree of swelling noted on physical examination. 4. Dyslipidemia. Obtain a fasting lipid panel, continue statin therapy with atorvastatin. 5. Hypertension. Continue lisinopril and Coreg 6. Hypokalemia. Patient receiving 40 mEq of K-Dur in the emergency room, will provide an additional 40 mEq of K-Dur on the floor. Repeat a.m. lab work 7. DVT prophylaxis. Lovenox   Code Status: Full Code Family Communication: Spoke with patient's son present at bedside Disposition Plan: Place in 24 hour obs, do not anticipate patient requiring greater than 2 nights hospitalization  Time spent: 60 min  Kelvin Cellar [facility] Pager [phone]

## 2013-07-03 NOTE — ED Notes (Signed)
Family at bedside. 

## 2013-07-04 DIAGNOSIS — I059 Rheumatic mitral valve disease, unspecified: Secondary | ICD-10-CM

## 2013-07-04 DIAGNOSIS — M069 Rheumatoid arthritis, unspecified: Secondary | ICD-10-CM | POA: Diagnosis present

## 2013-07-04 DIAGNOSIS — D649 Anemia, unspecified: Secondary | ICD-10-CM

## 2013-07-04 LAB — BASIC METABOLIC PANEL
BUN: 11 mg/dL (ref 6–23)
CALCIUM: 9.1 mg/dL (ref 8.4–10.5)
CO2: 31 meq/L (ref 19–32)
Chloride: 96 mEq/L (ref 96–112)
Creatinine, Ser: 0.66 mg/dL (ref 0.50–1.10)
GFR calc Af Amer: >90 mL/min (ref >90)
GFR, EST NON AFRICAN AMERICAN: 85 mL/min — AB (ref >90)
GLUCOSE: 97 mg/dL (ref 70–99)
Potassium: 3.4 mEq/L — ABNORMAL LOW (ref 3.7–5.3)
Sodium: 140 mEq/L (ref 137–147)

## 2013-07-04 LAB — RHEUMATOID FACTOR: Rhuematoid fact SerPl-aCnc: 99 IU/mL — ABNORMAL HIGH (ref <=14)

## 2013-07-04 LAB — CBC
HCT: 26.6 % — ABNORMAL LOW (ref 36.0–46.0)
Hemoglobin: 8.2 g/dL — ABNORMAL LOW (ref 12.0–15.0)
MCH: 22 pg — ABNORMAL LOW (ref 26.0–34.0)
MCHC: 30.8 g/dL (ref 30.0–36.0)
MCV: 71.5 fL — AB (ref 78.0–100.0)
Platelets: 290 10*3/uL (ref 150–400)
RBC: 3.72 MIL/uL — ABNORMAL LOW (ref 3.87–5.11)
RDW: 19.1 % — ABNORMAL HIGH (ref 11.5–15.5)
WBC: 5.2 10*3/uL (ref 4.0–10.5)

## 2013-07-04 LAB — TROPONIN I
Troponin I: <0.3 ng/mL (ref <0.30)
Troponin I: <0.3 ng/mL (ref <0.30)

## 2013-07-04 LAB — TSH: TSH: 0.501 u[IU]/mL (ref 0.350–4.500)

## 2013-07-04 LAB — MRSA PCR SCREENING: MRSA BY PCR: NEGATIVE

## 2013-07-04 LAB — C-REACTIVE PROTEIN: CRP: 12.1 mg/dL — AB (ref <0.60)

## 2013-07-04 MED ORDER — ENSURE COMPLETE PO LIQD
237.0000 mL | Freq: Two times a day (BID) | ORAL | Status: DC
  Administered 2013-07-05: 237 mL via ORAL

## 2013-07-04 NOTE — ED Provider Notes (Signed)
Date: 07/03/2013  Rate: 70  Rhythm: sinus  QRS Axis: left  Intervals: wide QRS  ST/T Wave abnormalities: nonspecific T wave changes  Conduction Disutrbances:nonspecific intraventricular conduction delay  Narrative Interpretation:   Old EKG Reviewed: unchanged  Medical screening examination/treatment/procedure(s) were performed by non-physician practitioner and as supervising physician I was immediately available for consultation/collaboration.     Neta Ehlers, MD 07/04/13 1410

## 2013-07-04 NOTE — Progress Notes (Signed)
  Echocardiogram 2D Echocardiogram has been performed.  [name] 07/04/2013, 11:38 AM

## 2013-07-04 NOTE — Progress Notes (Signed)
Utilization review completed.  P.J. Katana Berthold,RN,BSN Case Manager [phone]  

## 2013-07-04 NOTE — Progress Notes (Signed)
[facility] PROGRESS NOTE  [name] [id number] DOB: [birth year] DOA: 07/03/2013 PCP: [name], MD  Brief narrative: 73 y.o. female with a past medical history of hypertension, chronic systolic heart failure (EF on this admission ECHO 30-35% but better since last year when it was ~ 20%), left bundle branch block, status post biventricular ICD implantation who presented to [facility] ED 07/03/2013 with complaints of chest pain, retrosternal, pressure like and non radiating with no associated symptoms. Symptoms lasted several seconds and have spontaneously resolved. She also complained of the right and left wrist pain. Her admission vital signs were stable. She did have very slight elevation in troponin level, 0.02 but subsequently WNL. The 12 lead EKG showed NSR.  CXR showed AICD and no acute cardiopulmonary findings. Wrist x rays showed diffuse joint swelling but no bony abnormalities.  Assessment/Plan:  Principal Problem:   Chest pain - first troponin 0.02 but subsequent sets x 3 all negative - the 12 lead EKG showed NSR on admission - her 2 D ECHO on this admission showed F 35% which is better since last year. She has AICD and follows with Dr. Crissie Sickles outpt - continue aspirin, lasix and coreg - oxygen support via [place] to keep O2 saturation above 90%  Active Problems:   HYPERLIPIDEMIA - continue statin therapy   Chronic systolic heart failure - as mentioned above EF improved from last year based on this admission 2 D ECHO   Anemia of chronic disease - likely due to CHF - hemoglobin 8.2 this am - no current indications for transfusion   Hypertension - continue lisinopril and norvasc   Hypokalemia - likely due to lasix - being repleted   Pain and swelling of wrist - likely rheumatoid arthritis and /or gout - RF and CRP, ESR all elevated - pt reports pain is better with PO PRN oxycodone; would not use steroids now but if pain worsens may she could use it. She definitely  needs rheumatology referral on discharge - pt on allopurinol  Code Status: full code Family Communication: no family at the bedside Disposition Plan: home when stable  Leisa Lenz, MD  [facility] Pager [phone]  If 7PM-7AM, please contact night-coverage [email or web address] Password [email or web address] 07/04/2013, 6:02 PM   LOS: 1 day   Consultants:  None   Procedures:  None   Antibiotics:  None   HPI/Subjective: She is sitting in a chair this am, says she feels weak otherwise no chest pain.  Objective: Filed Vitals:   07/03/13 2110 07/04/13 0418 07/04/13 0940 07/04/13 1152  BP: 141/71 131/68 110/48 112/48  Pulse: 73 75 72   Temp: 98.1 F (36.7 C) 99.4 F (37.4 C) 98.9 F (37.2 C)   TempSrc: Oral Oral Oral   Resp: _0 SpO2: 99% 94% 96%     Intake/Output Summary (Last 24 hours) at 07/04/13 1802 Last data filed at 07/04/13 1158  Gross per 24 hour  Intake      6 ml  Output      0 ml  Net      6 ml    Exam:   General:  Pt is alert, follows commands appropriately, not in acute distress  Cardiovascular: Regular rate and rhythm, S1/S2 appreciated  Respiratory: Clear to auscultation bilaterally, no wheezing, no crackles, no rhonchi  Abdomen: Soft, non tender, non distended, bowel sounds present, no guarding  Extremities: No edema, pulses DP and PT palpable bilaterally  Neuro: Grossly nonfocal  Data Reviewed: Basic Metabolic Panel:  Recent Labs Lab 07/03/13 1200 07/03/13 1840 07/04/13 0705  NA 143  --  140  K 2.6*  --  3.4*  CL 97  --  96  CO2 32  --  31  GLUCOSE 123*  --  97  BUN 14  --  11  CREATININE 0.62 0.66 0.66  CALCIUM 9.1  --  9.1   Liver Function Tests: No results found for this basename: AST, ALT, ALKPHOS, BILITOT, PROT, ALBUMIN,  in the last 168 hours No results found for this basename: LIPASE, AMYLASE,  in the last 168 hours No results found for this basename: AMMONIA,  in the last 168 hours CBC:  Recent Labs Lab  07/03/13 1200 07/03/13 1840 07/04/13 0705  WBC 5.7 4.8 5.2  HGB 8.5* 8.3* 8.2*  HCT 27.6* 26.8* 26.6*  MCV 71.1* 71.7* 71.5*  PLT 307 315 290   Cardiac Enzymes:  Recent Labs Lab 07/03/13 1840 07/04/13 0155 07/04/13 0705  TROPONINI <0.30 <0.30 <0.30   BNP: No components found with this basename: POCBNP,  CBG: No results found for this basename: GLUCAP,  in the last 168 hours  Recent Results (from the past 240 hour(s))  MRSA PCR SCREENING     Status: None   Collection Time    07/03/13 11:44 PM      Result Value Ref Range Status   MRSA by PCR NEGATIVE  NEGATIVE Final   Comment:            The GeneXpert MRSA Assay (FDA     approved for NASAL specimens     only), is one component of a     comprehensive MRSA colonization     surveillance program. It is not     intended to diagnose MRSA     infection nor to guide or     monitor treatment for     MRSA infections.     Studies: Dg Chest 2 View  07/03/2013   CLINICAL DATA:  Cough and congestion  EXAM: CHEST  2 VIEW  COMPARISON:  07/29/2012  FINDINGS: Cardiac shadow is within normal limits. A defibrillator is again noted. Mild interstitial changes are seen without focal confluent infiltrate. Postsurgical changes are noted in the left axilla.  IMPRESSION: No acute abnormality noted.   Electronically Signed   By: Inez Catalina M.D.   On: 07/03/2013 13:14   Dg Wrist 2 Views Left  07/03/2013   CLINICAL DATA:  Left wrist pain and swelling.  EXAM: LEFT WRIST - 2 VIEW  COMPARISON:  None.  FINDINGS: Diffuse soft tissue swelling.  Unremarkable bones.  IMPRESSION: Diffuse soft tissue swelling without underlying bony abnormality.   Electronically Signed   By: Enrique Sack M.D.   On: 07/03/2013 20:03   Dg Wrist 2 Views Right  07/03/2013   CLINICAL DATA:  Bilateral wrist pain and swelling.  EXAM: RIGHT WRIST - 2 VIEW  COMPARISON:  None.  FINDINGS: Dorsal soft tissue swelling.  Unremarkable bones.  IMPRESSION: Dorsal soft tissue swelling  without underlying bony abnormality.   Electronically Signed   By: Enrique Sack M.D.   On: 07/03/2013 20:02    Scheduled Meds: . allopurinol  100 mg Oral Daily  . amLODipine  5 mg Oral Daily  . aspirin EC  81 mg Oral Daily  . atorvastatin  40 mg Oral q1800  . carvedilol  25 mg Oral BID WC  . enoxaparin (LOVENOX)   40 mg Subcutaneous Q24H  . furosemide  40 mg Oral BID  .  lisinopril  40 mg Oral Daily  . pantoprazole  40 mg Oral Daily

## 2013-07-04 NOTE — Progress Notes (Signed)
INITIAL NUTRITION ASSESSMENT  DOCUMENTATION CODES Per approved criteria  -Not Applicable   INTERVENTION: - Recommend nursing obtain weight for current admission - Ensure Complete BID - Unit RD to continue to monitor   NUTRITION DIAGNOSIS: Unintended weight loss related to poor appetite as evidenced by pt report.   Goal: Pt to consume >90% of meals/supplements  Monitor:  Weights, labs, intake  Reason for Assessment: Malnutrition screening tool   73 y.o. female  Admitting Dx: Chest pain, wrist pain  ASSESSMENT: Pt with past medical history of hypertension, chronic systolic heart failure, left bundle branch block, status post biventricular ICD implantation, presenting to the emergency room with complaints of chest pain in joint pain.  Pt reports 12 pound unintended weight loss in the past 2 months, maybe more, but there is no weight yet documented for this admission. Pt reports eating 1-2 meals/day PTA, did not specify what kind of foods. Interested in getting Ensure.   Potassium low, getting oral replacement  Height: Ht Readings from Last 1 Encounters:  11/06/12 5\' 4"  (1.626 m)    Weight: Wt Readings from Last 1 Encounters:  11/06/12 153 lb (69.4 kg)    Ideal Body Weight: 120 lb   % Ideal Body Weight: Unable to assess   Wt Readings from Last 10 Encounters:  11/06/12 153 lb (69.4 kg)  07/28/12 153 lb (69.4 kg)  07/28/12 153 lb (69.4 kg)  07/14/12 153 lb 0.6 oz (69.418 kg)  06/09/12 149 lb (67.586 kg)  05/20/12 153 lb (69.4 kg)  03/25/11 150 lb (68.04 kg)  09/15/10 153 lb (69.4 kg)  08/10/10 156 lb (70.761 kg)  12/14/09 160 lb (72.576 kg)    Usual Body Weight: Pt unsure   BMI:  There is no weight on file to calculate BMI.  Estimated Nutritional Needs Based on Height: Kcal: 1400-1600 Protein: 65-80g Fluid: 1.4-1.6L/day  Skin: +3 RLE, LLE edema  Diet Order: Cardiac  EDUCATION NEEDS: -No education needs identified at this time   Intake/Output  Summary (Last 24 hours) at 07/04/13 1627 Last data filed at 07/04/13 1158  Gross per 24 hour  Intake      6 ml  Output      0 ml  Net      6 ml    Last BM: 3/14  Labs:   Recent Labs Lab 07/03/13 1200 07/03/13 1840 07/04/13 0705  NA 143  --  140  K 2.6*  --  3.4*  CL 97  --  96  CO2 32  --  31  BUN 14  --  11  CREATININE 0.62 0.66 0.66  CALCIUM 9.1  --  9.1  GLUCOSE 123*  --  97    CBG (last 3)  No results found for this basename: GLUCAP,  in the last 72 hours  Scheduled Meds: . allopurinol  100 mg Oral Daily  . amLODipine  5 mg Oral Daily  . aspirin EC  81 mg Oral Daily  . atorvastatin  40 mg Oral q1800  . carvedilol  25 mg Oral BID WC  . enoxaparin (LOVENOX) injection  40 mg Subcutaneous Q24H  . furosemide  40 mg Oral BID  . lisinopril  40 mg Oral Daily  . pantoprazole  40 mg Oral Daily  . sodium chloride  3 mL Intravenous Q12H  . sodium chloride  3 mL Intravenous Q12H    Continuous Infusions:   Past Medical History  Diagnosis Date  . Arteriosclerotic cardiovascular disease (ASCVD)  Remote PTCA by patient report; LBBB; associated cardiomyopathy, presumed ischemic with EF 40-45% previously, 20% in 06/2009; h/o clinical congestive heart failure; negative stress nuclear in 2009 with inferoseptal and apical scar  . Anemia     Hgb of 9-10  . Hyperlipidemia   . Breast cancer   . Hypertension   . Dysrhythmia     LBBB  . Automatic implantable cardioverter-defibrillator in situ   . Pneumonia     "twice" (07/03/2013)  . Arthritis     "arms" (07/03/2013)  . HOH (hard of hearing)     Past Surgical History  Procedure Laterality Date  . Total knee arthroplasty Right     Dr.Harrison  . Bi-ventricular implantable cardioverter defibrillator  (crt-d)  07/28/2012  . Cataract extraction w/ intraocular lens implant Left   . Tubal ligation    . Breast biopsy Bilateral   . Mastectomy Left [place] MS, RD, LDN [phone] Weekend/After Hours Pager

## 2013-07-05 LAB — CBC
HCT: 25.1 % — ABNORMAL LOW (ref 36.0–46.0)
Hemoglobin: 7.7 g/dL — ABNORMAL LOW (ref 12.0–15.0)
MCH: 21.8 pg — AB (ref 26.0–34.0)
MCHC: 30.7 g/dL (ref 30.0–36.0)
MCV: 70.9 fL — ABNORMAL LOW (ref 78.0–100.0)
PLATELETS: 281 10*3/uL (ref 150–400)
RBC: 3.54 MIL/uL — ABNORMAL LOW (ref 3.87–5.11)
RDW: 19.3 % — AB (ref 11.5–15.5)
WBC: 5.1 10*3/uL (ref 4.0–10.5)

## 2013-07-05 LAB — BASIC METABOLIC PANEL
BUN: 13 mg/dL (ref 6–23)
CALCIUM: 9 mg/dL (ref 8.4–10.5)
CO2: 28 mEq/L (ref 19–32)
Chloride: 95 mEq/L — ABNORMAL LOW (ref 96–112)
Creatinine, Ser: 0.6 mg/dL (ref 0.50–1.10)
GFR calc Af Amer: >90 mL/min (ref >90)
GFR calc non Af Amer: 88 mL/min — ABNORMAL LOW (ref >90)
Glucose, Bld: 84 mg/dL (ref 70–99)
Potassium: 3.1 mEq/L — ABNORMAL LOW (ref 3.7–5.3)
Sodium: 139 mEq/L (ref 137–147)

## 2013-07-05 MED ORDER — POTASSIUM CHLORIDE CRYS ER 20 MEQ PO TBCR
40.0000 meq | EXTENDED_RELEASE_TABLET | Freq: Once | ORAL | Status: AC
  Administered 2013-07-05: 40 meq via ORAL
  Filled 2013-07-05: qty 2

## 2013-07-05 NOTE — Evaluation (Signed)
Physical Therapy Evaluation Patient Details Name: [name] MRN: [id number] DOB: 1941-02-03 Today's Date: 07/05/2013 Time: [id number] PT Time Calculation (min): 23 min  PT Assessment / Plan / Recommendation History of Present Illness  73 y.o. female with a past medical history of hypertension, chronic systolic heart failure (EF on this admission ECHO 30-35% but better since last year when it was ~ 20%), left bundle branch block, status post biventricular ICD implantation who presented to [facility] ED 07/03/2013 with complaints of chest pain, retrosternal, pressure like and non radiating with no associated symptoms.   Clinical Impression  Pt presents with impaired mobility and balance and will benefit from skilled PT services to address deficits and increase functional independence for safe d/c home.    PT Assessment  Patient needs continued PT services    Follow Up Recommendations  Home health PT    Does the patient have the potential to tolerate intense rehabilitation      Barriers to Discharge        Equipment Recommendations  None recommended by PT    Recommendations for Other Services     Frequency Min 3X/week    Precautions / Restrictions Restrictions Weight Bearing Restrictions: No   Pertinent Vitals/Pain Pt c/o L foot pain with gait, eases with rest      Mobility  Bed Mobility General bed mobility comments: pt in chair on PT arrival Transfers Overall transfer level: Needs assistance Transfers: Sit to/from Stand Sit to Stand: Mod assist General transfer comment: pt initially mod A to stand from recliner, requires cues for hand placement and lifting assist.  after gait training pt again performed sit to stand and pt able to perform with supervision/increased time Ambulation/Gait Ambulation/Gait assistance: Min guard Ambulation Distance (Feet): 100 Feet Assistive device: None Gait velocity interpretation: Below normal speed for age/gender General Gait Details: pt with  antalgic gait due to c/o L foot pain.  pt initially requires min guard for balance, gait fluidity improves and antalgic gait decreases as pt "warms up".  Pt requires short standing rest breaks and holds to railing in hallway due to fatigue    Exercises     PT Diagnosis: Difficulty walking;Generalized weakness  PT Problem List: Decreased activity tolerance;Decreased balance;Decreased mobility;Decreased knowledge of use of DME PT Treatment Interventions: DME instruction;Balance training;Modalities;Neuromuscular re-education;Gait training;Stair training;Functional mobility training;Patient/family education;Therapeutic activities;Therapeutic exercise     PT Goals(Current goals can be found in the care plan section) Acute Rehab PT Goals Patient Stated Goal: get better and go home PT Goal Formulation: With patient Time For Goal Achievement: 07/12/13 Potential to Achieve Goals: Good  Visit Information  Last PT Received On: 07/05/13 Assistance Needed: +1 History of Present Illness: 73 y.o. female with a past medical history of hypertension, chronic systolic heart failure (EF on this admission ECHO 30-35% but better since last year when it was ~ 20%), left bundle branch block, status post biventricular ICD implantation who presented to [facility] ED 07/03/2013 with complaints of chest pain, retrosternal, pressure like and non radiating with no associated symptoms.        Prior [place] expects to be discharged to:: Private residence Living Arrangements: Spouse/significant other Available Help at Discharge: Family Type of Home: Mobile home Home Access: Stairs to enter Entrance Stairs-Number of Steps: 2 Entrance Stairs-Rails: None Home Layout: One level Home Equipment: Environmental consultant - 2 wheels Prior Function Level of Independence: Independent Communication Communication: HOH    Cognition  Cognition Arousal/Alertness: Awake/alert Behavior During Therapy: WFL for  tasks  assessed/performed Overall Cognitive Status: Within Functional Limits for tasks assessed    Extremity/Trunk Assessment Upper Extremity Assessment Upper Extremity Assessment: Generalized weakness Lower Extremity Assessment Lower Extremity Assessment: Generalized weakness Cervical / Trunk Assessment Cervical / Trunk Assessment: Kyphotic   Balance    End of Session PT - End of Session Equipment Utilized During Treatment: Gait belt Activity Tolerance: Patient tolerated treatment well Patient left: in chair;with call bell/phone within reach Nurse Communication: Mobility status  GP Functional Assessment Tool Used: clinical judgement Functional Limitation: Mobility: Walking and moving around Mobility: Walking and Moving Around Current Status [id number]): At least 20 percent but less than 40 percent impaired, limited or restricted Mobility: Walking and Moving Around Goal Status [phone]): At least 1 percent but less than 20 percent impaired, limited or restricted   [place] 07/05/2013, 10:42 AM

## 2013-07-05 NOTE — Progress Notes (Addendum)
[facility] PROGRESS NOTE  [name] [id number] DOB: [birth year] DOA: 07/03/2013 PCP: [name], MD  Brief narrative: 73 y.o. female with a past medical history of hypertension, chronic systolic heart failure (EF on this admission ECHO 30-35% but better since last year when it was ~ 20%), left bundle branch block, status post biventricular ICD implantation who presented to [facility] ED 07/03/2013 with complaints of chest pain, retrosternal, pressure like and non radiating with no associated symptoms. Symptoms lasted several seconds and have spontaneously resolved. She also complained of the right and left wrist pain. Her admission vital signs were stable. She did have very slight elevation in troponin level, 0.02 but subsequently WNL. The 12 lead EKG showed NSR. CXR showed AICD and no acute cardiopulmonary findings. Wrist x rays showed diffuse joint swelling but no bony abnormalities. At this time we are awaiting physical therapy evaluation for safe discharge plan.  Assessment/Plan:   Principal Problem:  Chest pain  - first troponin 0.02 but subsequent sets x 3 all negative  - the 12 lead EKG showed NSR on admission  - her 2 D ECHO on this admission showed F 35% which is better since last year. She has AICD and follows with Dr. Crissie Sickles outpt  - continue aspirin, lasix and coreg  - oxygen support via  to keep O2 saturation above 90%  Active Problems:  HYPERLIPIDEMIA  - continue statin therapy  Chronic systolic heart failure  - as mentioned above EF improved from last year based on this admission 2 D ECHO  - continue coreg and lasix Anemia of chronic disease  - likely due to CHF  - hemoglobin 8.2 and then 7.7 but no signs of active bleed; check CBC in am and if further drop will consider blood transfusion.  Hypertension  - continue lisinopril and norvasc  Hypokalemia  - likely due to lasix  - being repleted  Pain and swelling of wrist  - likely rheumatoid arthritis and /or  gout  - RF and CRP, ESR all elevated  - pt reports pain is better with PO PRN oxycodone; would not use steroids now but if pain worsens may she could use it. She definitely needs rheumatology referral on discharge  - pt on allopurinol   Code Status: full code  Family Communication: no family at the bedside  Disposition Plan: home when stable   Consultants:  None  Procedures:  None  Antibiotics:  None    Leisa Lenz, MD  [facility] Pager [phone]  If 7PM-7AM, please contact night-coverage [email or web address] Password [facility] 07/05/2013, 7:22 PM   LOS: 2 days    HPI/Subjective: No acute overnight events.   Objective: Filed Vitals:   07/05/13 0751 07/05/13 0912 07/05/13 1355 07/05/13 1740  BP: 138/54 105/50 109/48 104/41  Pulse: 76  84 82  Temp:   99.6 F (37.6 C)   TempSrc:   Oral   Resp:   18   Height:      Weight:      SpO2:   97%    No intake or output data in the 24 hours ending 07/05/13 1922  Exam:  General: Pt is alert, follows commands appropriately, not in acute distress  Cardiovascular: Regular rate and rhythm, S1/S2 appreciated  Respiratory: Clear to auscultation bilaterally, no wheezing, no crackles, no rhonchi  Abdomen: Soft, non tender, non distended, bowel sounds present, no guarding  Extremities: No edema, pulses DP and PT palpable bilaterally  Neuro: Grossly nonfocal   Data Reviewed: Basic Metabolic  Panel:  Recent Labs Lab 07/03/13 1200 07/03/13 1840 07/04/13 0705 07/05/13 0515  NA 143  --  140 139  K 2.6*  --  3.4* 3.1*  CL 97  --  96 95*  CO2 32  --  31 28  GLUCOSE 123*  --  97 84  BUN 14  --  11 13  CREATININE 0.62 0.66 0.66 0.60  CALCIUM 9.1  --  9.1 9.0   Liver Function Tests: No results found for this basename: AST, ALT, ALKPHOS, BILITOT, PROT, ALBUMIN,  in the last 168 hours No results found for this basename: LIPASE, AMYLASE,  in the last 168 hours No results found for this basename: AMMONIA,  in the last 168  hours CBC:  Recent Labs Lab 07/03/13 1200 07/03/13 1840 07/04/13 0705 07/05/13 0515  WBC 5.7 4.8 5.2 5.1  HGB 8.5* 8.3* 8.2* 7.7*  HCT 27.6* 26.8* 26.6* 25.1*  MCV 71.1* 71.7* 71.5* 70.9*  PLT 307 315 290 281   Cardiac Enzymes:  Recent Labs Lab 07/03/13 1840 07/04/13 0155 07/04/13 0705  TROPONINI <0.30 <0.30 <0.30   BNP: No components found with this basename: POCBNP,  CBG: No results found for this basename: GLUCAP,  in the last 168 hours  Recent Results (from the past 240 hour(s))  MRSA PCR SCREENING     Status: None   Collection Time    07/03/13 11:44 PM      Result Value Ref Range Status   MRSA by PCR NEGATIVE  NEGATIVE Final   Comment:            The GeneXpert MRSA Assay (FDA     approved for NASAL specimens     only), is one component of a     comprehensive MRSA colonization     surveillance program. It is not     intended to diagnose MRSA     infection nor to guide or     monitor treatment for     MRSA infections.     Studies: Dg Wrist 2 Views Left  07/03/2013   CLINICAL DATA:  Left wrist pain and swelling.  EXAM: LEFT WRIST - 2 VIEW  COMPARISON:  None.  FINDINGS: Diffuse soft tissue swelling.  Unremarkable bones.  IMPRESSION: Diffuse soft tissue swelling without underlying bony abnormality.   Electronically Signed   By: Enrique Sack M.D.   On: 07/03/2013 20:03   Dg Wrist 2 Views Right  07/03/2013   CLINICAL DATA:  Bilateral wrist pain and swelling.  EXAM: RIGHT WRIST - 2 VIEW  COMPARISON:  None.  FINDINGS: Dorsal soft tissue swelling.  Unremarkable bones.  IMPRESSION: Dorsal soft tissue swelling without underlying bony abnormality.   Electronically Signed   By: Enrique Sack M.D.   On: 07/03/2013 20:02    Scheduled Meds: . allopurinol  100 mg Oral Daily  . amLODipine  5 mg Oral Daily  . aspirin EC  81 mg Oral Daily  . atorvastatin  40 mg Oral q1800  . carvedilol  25 mg Oral BID WC  . enoxaparin (LOVENOX) injection  40 mg Subcutaneous Q24H  . feeding  supplement (ENSURE COMPLETE)  237 mL Oral BID BM  . furosemide  40 mg Oral BID  . lisinopril  40 mg Oral Daily  . pantoprazole  40 mg Oral Daily   Continuous Infusions:

## 2013-07-06 ENCOUNTER — Encounter: Payer: Self-pay | Admitting: Internal Medicine

## 2013-07-06 DIAGNOSIS — I509 Heart failure, unspecified: Secondary | ICD-10-CM

## 2013-07-06 LAB — CBC
HCT: 27.4 % — ABNORMAL LOW (ref 36.0–46.0)
HEMOGLOBIN: 8.5 g/dL — AB (ref 12.0–15.0)
MCH: 22 pg — AB (ref 26.0–34.0)
MCHC: 31 g/dL (ref 30.0–36.0)
MCV: 71 fL — ABNORMAL LOW (ref 78.0–100.0)
PLATELETS: 305 10*3/uL (ref 150–400)
RBC: 3.86 MIL/uL — ABNORMAL LOW (ref 3.87–5.11)
RDW: 19.1 % — AB (ref 11.5–15.5)
WBC: 6.4 10*3/uL (ref 4.0–10.5)

## 2013-07-06 LAB — BASIC METABOLIC PANEL
BUN: 15 mg/dL (ref 6–23)
CALCIUM: 9.5 mg/dL (ref 8.4–10.5)
CO2: 30 mEq/L (ref 19–32)
Chloride: 96 mEq/L (ref 96–112)
Creatinine, Ser: 0.67 mg/dL (ref 0.50–1.10)
GFR calc Af Amer: >90 mL/min (ref >90)
GFR, EST NON AFRICAN AMERICAN: 84 mL/min — AB (ref >90)
Glucose, Bld: 94 mg/dL (ref 70–99)
Potassium: 4.1 mEq/L (ref 3.7–5.3)
SODIUM: 139 meq/L (ref 137–147)

## 2013-07-06 LAB — ANTI-NUCLEAR AB-TITER (ANA TITER): ANA TITER 1: NEGATIVE (ref <1:40)

## 2013-07-06 LAB — ANA: Anti Nuclear Antibody(ANA): POSITIVE — AB

## 2013-07-06 LAB — CYCLIC CITRUL PEPTIDE ANTIBODY, IGG: Cyclic Citrullin Peptide Ab: 45.5 U/mL — ABNORMAL HIGH (ref 0.0–5.0)

## 2013-07-06 MED ORDER — POTASSIUM CHLORIDE ER 10 MEQ PO TBCR: 10.0000 meq | EXTENDED_RELEASE_TABLET | Freq: Two times a day (BID) | ORAL | Status: DC

## 2013-07-06 MED ORDER — IBUPROFEN 200 MG PO TABS: 200.0000 mg | ORAL_TABLET | Freq: Four times a day (QID) | ORAL | Status: DC | PRN

## 2013-07-06 NOTE — Progress Notes (Signed)
D/c orders received;IV removed with gauze on, pt remains in stable condition, pt meds and instructions reviewed and given to pt; pt d/c to home; reviewed CHF info with pt

## 2013-07-06 NOTE — Care Management Note (Signed)
    Page 1 of 1   07/06/2013     11:57:56 AM   CARE MANAGEMENT NOTE 07/06/2013  Patient:  [name], [name]   Account Number:  [id number]  Date Initiated:  07/06/2013  Documentation initiated by:  GRAVES-BIGELOW,Deneane Stifter  Subjective/Objective Assessment:   Pt admitted for Chest pain, wrist pain. Plan will be for d/c home with [facility] services.     Action/Plan:   CM did make referral for [facility] services, PT/OT. SOC to begin within 24-48 hrs post d/c. No further needs from CM at this time.   Anticipated DC Date:  07/06/2013   Anticipated DC Plan:  [place]  CM consult      [facility] Choice  HOME HEALTH   Choice offered to / List presented to:  C-1 Patient           Status of service:  Completed, signed off Medicare Important Message given?   (If response is "NO", the following Medicare IM given date fields will be blank) Date Medicare IM given:   Date Additional Medicare IM given:    Discharge Disposition:  [place]  Per UR Regulation:  Reviewed for med. necessity/level of care/duration of stay  If discussed at [place] of Stay Meetings, dates discussed:    Comments:

## 2013-07-06 NOTE — Consult Note (Signed)
CONSULT NOTE  Date: 07/06/2013               Patient Name:  [name] MRN: [id number]  DOB: 1940-07-02 Age / Sex: 73 y.o., female        PCP: [facility] Primary Cardiologist: Learta Codding            Referring Physician: Algis Liming              Reason for Consult: CHF, Chest pain           History of Present Illness: Patient is a 73 y.o. female with a PMHx of severe chronic left ventricular dysfunction, coronary artery disease, left bundle branch block, BI- V ICD placement, hypertension, and hyperlipidemia who was admitted to [facility] on 07/03/2013 for evaluation of chest pain and joint pain..    Medications: Outpatient medications: Prescriptions prior to admission  Medication Sig Dispense Refill  . allopurinol (ZYLOPRIM) 100 MG tablet Take 100 mg by mouth daily.      [name] amLODipine (NORVASC) 5 MG tablet Take 5 mg by mouth daily.      [name] aspirin 81 MG tablet Take 81 mg by mouth daily.      . carvedilol (COREG) 25 MG tablet Take 1 tablet (25 mg total) by mouth 2 (two) times daily with a meal.  60 tablet  5  . COLCRYS 0.6 MG tablet Take 1 tablet by mouth 2 (two) times daily.       . furosemide (LASIX) 40 MG tablet Take 40 mg by mouth 2 (two) times daily.      [name] ibuprofen (ADVIL,MOTRIN) 200 MG tablet Take 200 mg by mouth every 6 (six) hours as needed for fever or headache.      . lisinopril (PRINIVIL,ZESTRIL) 40 MG tablet Take 1 tablet (40 mg total) by mouth daily.  30 tablet  0  . lovastatin (MEVACOR) 40 MG tablet Take 1 tablet by mouth at bedtime.      . metoprolol (LOPRESSOR) 50 MG tablet Take 50 mg by mouth 2 (two) times daily.      [name] omeprazole (PRILOSEC) 20 MG capsule Take 20 mg by mouth daily. For gas/heartburn        Current medications: Current Facility-Administered Medications  Medication Dose Route Frequency Provider Last Rate Last Dose  . 0.9 %  sodium chloride infusion  250 mL Intravenous PRN Kelvin Cellar, MD      . acetaminophen (TYLENOL) tablet 650 mg   650 mg Oral Q6H PRN Kelvin Cellar, MD       Or  . acetaminophen (TYLENOL) suppository 650 mg  650 mg Rectal Q6H PRN Kelvin Cellar, MD      . allopurinol (ZYLOPRIM) tablet 100 mg  100 mg Oral Daily Kelvin Cellar, MD   100 mg at 07/05/13 0912  . amLODipine (NORVASC) tablet 5 mg  5 mg Oral Daily Kelvin Cellar, MD   5 mg at 07/05/13 0912  . aspirin EC tablet 81 mg  81 mg Oral Daily Kelvin Cellar, MD   81 mg at 07/05/13 0912  . atorvastatin (LIPITOR) tablet 40 mg  40 mg Oral q1800 Kelvin Cellar, MD   40 mg at 07/05/13 1738  . carvedilol (COREG) tablet 25 mg  25 mg Oral BID WC Kelvin Cellar, MD   25 mg at 07/06/13 0745  . enoxaparin (LOVENOX) injection 40 mg  40 mg Subcutaneous Q24H Kelvin Cellar, MD   40 mg at 07/05/13 2130  . feeding supplement (ENSURE  COMPLETE) (ENSURE COMPLETE) liquid 237 mL  237 mL Oral BID BM Christie Beckers, RD   237 mL at 07/05/13 0913  . furosemide (LASIX) tablet 40 mg  40 mg Oral BID Kelvin Cellar, MD   40 mg at 07/06/13 0745  . lisinopril (PRINIVIL,ZESTRIL) tablet 40 mg  40 mg Oral Daily Kelvin Cellar, MD   40 mg at 07/05/13 0912  . ondansetron (ZOFRAN) tablet 4 mg  4 mg Oral Q6H PRN Kelvin Cellar, MD       Or  . ondansetron (ZOFRAN) injection 4 mg  4 mg Intravenous Q6H PRN Kelvin Cellar, MD      . oxyCODONE (Oxy IR/ROXICODONE) immediate release tablet 5 mg  5 mg Oral Q4H PRN Kelvin Cellar, MD   5 mg at 07/05/13 1501  . pantoprazole (PROTONIX) EC tablet 40 mg  40 mg Oral Daily Kelvin Cellar, MD   40 mg at 07/05/13 0912  . sodium chloride 0.9 % injection 3 mL  3 mL Intravenous Q12H Kelvin Cellar, MD   3 mL at 07/05/13 0912  . sodium chloride 0.9 % injection 3 mL  3 mL Intravenous Q12H Kelvin Cellar, MD   3 mL at 07/05/13 2135  . sodium chloride 0.9 % injection 3 mL  3 mL Intravenous PRN Kelvin Cellar, MD         No Known Allergies   Past Medical History  Diagnosis Date  . Arteriosclerotic cardiovascular disease (ASCVD)     Remote PTCA  by patient report; LBBB; associated cardiomyopathy, presumed ischemic with EF 40-45% previously, 20% in 06/2009; h/o clinical congestive heart failure; negative stress nuclear in 2009 with inferoseptal and apical scar  . Anemia     Hgb of 9-10  . Hyperlipidemia   . Breast cancer   . Hypertension   . Dysrhythmia     LBBB  . Automatic implantable cardioverter-defibrillator in situ   . Pneumonia     "twice" (07/03/2013)  . Arthritis     "arms" (07/03/2013)  . HOH (hard of hearing)     Past Surgical History  Procedure Laterality Date  . Total knee arthroplasty Right     Dr.Harrison  . Bi-ventricular implantable cardioverter defibrillator  (crt-d)  07/28/2012  . Cataract extraction w/ intraocular lens implant Left   . Tubal ligation    . Breast biopsy Bilateral   . Mastectomy Left 1998    History reviewed. No pertinent family history.  Social History:  reports that she has quit smoking. Her smoking use included Cigarettes. She smoked 0.00 packs per day. Her smokeless tobacco use includes Chew. She reports that she does not drink alcohol or use illicit drugs.   Review of Systems: Constitutional:  denies fever, chills, diaphoresis, appetite change and fatigue.  HEENT: denies photophobia, eye pain, redness, hearing loss, ear pain, congestion, sore throat, rhinorrhea, sneezing, neck pain, neck stiffness and tinnitus.  Respiratory: denies SOB, DOE, cough, chest tightness, and wheezing  Cardiovascular: denies chest pain, palpitations and leg swelling.  Gastrointestinal: denies nausea, vomiting, abdominal pain, diarrhea, constipation, blood in stool.  Genitourinary: denies dysuria, urgency, frequency, hematuria, flank pain and difficulty urinating.  Musculoskeletal: denies  myalgias, back pain, joint swelling, arthralgias and gait problem.   Skin: denies pallor, rash and wound.  Neurological: denies dizziness, seizures, syncope, weakness, light-headedness, numbness and headaches.   She is  very hard of hearing.  Hematological: denies adenopathy, easy bruising, personal or family bleeding history.  Psychiatric/ Behavioral: denies suicidal ideation, mood changes, confusion, nervousness, sleep disturbance  and agitation.    Physical Exam: BP 125/40  Pulse 77  Temp(Src) 98.4 F (36.9 C) (Oral)  Resp 18  Ht 5\' 4"  (1.626 m)  Wt 140 lb (63.504 kg)  BMI 24.02 kg/m2  SpO2 96%  Wt Readings from Last 3 Encounters:  07/06/13 140 lb (63.504 kg)  11/06/12 153 lb (69.4 kg)  07/28/12 153 lb (69.4 kg)    General: Vital signs reviewed and noted. Well-developed, well-nourished, in no acute distress; alert, she is very hard of hearing  Head: Normocephalic, atraumatic, sclera anicteric,   Neck: Supple. Negative for carotid bruits. No JVD   Lungs:  Clear bilaterally, no  wheezes, rales, or rhonchi. Breathing is normal   Heart: RRR with S1 S2. No murmurs, rubs, or gallops   Abdomen:  Soft, non-tender, non-distended with normoactive bowel sounds. No hepatomegaly. No rebound/guarding. No obvious abdominal masses   MSK: Her wrists are swollen and tender  Extremities: No clubbing or cyanosis. No edema.  Distal pedal pulses are 2+ and equal   Neurologic: Alert and oriented X 3. Moves all extremities spontaneously.  Hard of hearing   Psych: Responds to questions appropriately with a normal affect.     Lab results: Basic Metabolic Panel:  Recent Labs Lab 07/04/13 0705 07/05/13 0515 07/06/13 0524  NA 140 139 139  K 3.4* 3.1* 4.1  CL 96 95* 96  CO2 31 28 30   GLUCOSE 97 84 94  BUN 11 13 15   CREATININE 0.66 0.60 0.67  CALCIUM 9.1 9.0 9.5    Liver Function Tests: No results found for this basename: AST, ALT, ALKPHOS, BILITOT, PROT, ALBUMIN,  in the last 168 hours No results found for this basename: LIPASE, AMYLASE,  in the last 168 hours No results found for this basename: AMMONIA,  in the last 168 hours  CBC:  Recent Labs Lab 07/03/13 1200 07/03/13 1840 07/04/13 0705  07/05/13 0515 07/06/13 0524  WBC 5.7 4.8 5.2 5.1 6.4  HGB 8.5* 8.3* 8.2* 7.7* 8.5*  HCT 27.6* 26.8* 26.6* 25.1* 27.4*  MCV 71.1* 71.7* 71.5* 70.9* 71.0*  PLT 307 315 290 281 305    Cardiac Enzymes:  Recent Labs Lab 07/03/13 1840 07/04/13 0155 07/04/13 0705  TROPONINI <0.30 <0.30 <0.30    BNP: No components found with this basename: POCBNP,   CBG: No results found for this basename: GLUCAP,  in the last 168 hours  Coagulation Studies: No results found for this basename: LABPROT, INR,  in the last 72 hours   Other results: EKG L:  NSR , V paced  Imaging:  No results found.    Assessment & Plan:  1. Chest pain: The patient doesn't actually have chest pain. She was concerned about a bony prominence in her upper neck. This area is not tender. She is very hard of hearing so communication is somewhat difficult.   She has a history of congestive heart failure but her ejection fraction has actually improved since her previous echocardiogram.  She originally came to the emergency room because of wrist pain which is related to gout. I do not think that her chest/neck symptoms are related at all and that she has gout involving her sternum and clavicles.  2. Chronic systolic congestive heart failure: The patient has a biventricular ICD. Her ejection fraction has improved.  Her troponin levels are completely normal. She'll continue to followup with Dr. Lovena Le.  3. Anemia: Plans per internal medicine 4. Hypokalemia:   this is likely due to her Lasix therapy.  She was taking Lasix at home but was not taking any potassium. She'll need to take potassium on regular basis.  She does not need any further cardiology evaluation at this point.    Thayer Headings, Brooke Bonito., MD, [facility] 07/06/2013, 8:38 AM Office - [phone] Pager [phone]

## 2013-07-06 NOTE — Discharge Summary (Addendum)
Physician Discharge Summary  MERA GUNKEL HWY:[id number] DOB: [birth year] DOA: 07/03/2013  PCP: [name], MD  Admit date: 07/03/2013 Discharge date: 07/06/2013  Time spent: Less than 30 minutes  Recommendations for Outpatient Follow-up:  1. Dr. [name], PCP in 3 days. To be seen with repeat labs (CBC & BMP).  2. Recommend OP Rheumatology consultation. 3. Home Health PT  Discharge Diagnoses:  Principal Problem:   Chest pain Active Problems:   HYPERLIPIDEMIA   Chronic systolic heart failure   Anemia   Hypertension   Biventricular ICD (implantable cardioverter-defibrillator) in place   Hypokalemia   Pain and swelling of wrist   Rheumatoid arthritis   Discharge Condition: Improved & Stable  Diet recommendation: Heart Healthy diet.  Filed Weights   07/05/13 0548 07/06/13 0518  Weight: 63.368 kg (139 lb 11.2 oz) 63.504 kg (140 lb)    History of present illness:  73 y.o. female with a past medical history of hypertension, chronic systolic heart failure (EF on this admission ECHO 30-35% but better since last year when it was ~ 20%), left bundle branch block, status post biventricular ICD implantation who presented to [facility] ED 07/03/2013 with complaints of chest pain, retrosternal, pressure like and non radiating with no associated symptoms. Symptoms lasted several seconds and have spontaneously resolved. She also complained of the right and left wrist pain. Her admission vital signs were stable. She did have very slight elevation in troponin level, 0.02 but subsequently WNL. The 12 lead EKG showed NSR. CXR showed AICD and no acute cardiopulmonary findings. Wrist x rays showed diffuse joint swelling but no bony abnormalities.  Hospital Course:   Principal Problem:  Chest pain  - Troponin's cycled and negative. - the 12 lead EKG showed NSR on admission  - her 2 D ECHO on this admission showed F 35% which is better since last year. She has AICD and follows with Dr. Crissie Sickles  outpt  - continue aspirin, lasix and coreg. She was dual BB's per home med list- DC'ed Metoprolol.  - Cardiology has seen today and feel that Cp is atypical and likely MSS in etiology and recommend no further work up.  Active Problems:  HYPERLIPIDEMIA  - continue statin therapy   Chronic systolic heart failure/LBBB/biventricular ICD  - as mentioned above EF improved from last year based on this admission 2 D ECHO  - continue coreg and lasix  - Compensated  Anemia of chronic disease/Microcytic Anemia - likely due to CHF  - Stable.  - If not adequately evaluated, please consider OP evaluation including GI consultation for screening colonoscopy +/- EGD - FU CBC closely. No s/s suggestive of bleeding at this time.  Hypertension  - continue lisinopril, Coreg and norvasc   Hypokalemia  - likely due to lasix  - replaced. - Not on home supplements prior to admission- will add- close OP FU with BMP.  Pain and swelling of wrist/? acute gouty arthritis Vs RA Vs other etiology - RF and CRP, ESR all elevated. CCP +, ANA + - pt on allopurinol & Colchicine PTA. - states that her PCP told her that she had "gout". - improving with above Mx and Oxycodone in hospital (will not be DC'ed on this). May continue PRN Ibuprofen at DC. If not improving/worsens may consider PO prednisone - Recommend outpatient rheumatology consultation.   Hard of Hearing - one of impediments to adequate communication   Consultations:  Cardiology  Procedures:  None    Discharge Exam:  Complaints:  States that  she has not had any chest pain since hospital admission. Bilateral wrist pain left >right improving.  Filed Vitals:   07/05/13 1952 07/06/13 0518 07/06/13 0747 07/06/13 1023  BP: 94/51 131/59 125/40 111/45  Pulse: 70 78 77   Temp:  98.4 F (36.9 C)    TempSrc:  Oral    Resp: 18 18    Height:      Weight:  63.504 kg (140 lb)    SpO2: 97% 96%      General exam: Pleasant elderly female sitting  comfortably on the chair. Respiratory system: Clear. No increased work of breathing. Cardiovascular system: S1 & S2 heard, RRR. No JVD, murmurs, gallops, clicks or pedal edema. Telemetry: V. paced rhythm. Gastrointestinal system: Abdomen is nondistended, soft and nontender. Normal bowel sounds heard. Central nervous system: Alert and oriented. No focal neurological deficits. Extremities: Symmetric 5 x 5 power. Boggy and diffuse swelling of left greater than right wrist with mild increase in warmth and slightly painful range of movement and mild tenderness.  Discharge Instructions      Discharge Orders   Future Orders Complete By Expires   ([place]) Call MD:  Anytime you have any of the following symptoms: 1) 3 pound weight gain in 24 hours or 5 pounds in 1 week 2) shortness of breath, with or without a dry hacking cough 3) swelling in the hands, feet or stomach 4) if you have to sleep on extra pillows at night in order to breathe.  As directed    Call MD for:  severe uncontrolled pain  As directed    Call MD for:  temperature >100.4  As directed    Diet - low sodium heart healthy  As directed    Increase activity slowly  As directed        Medication List    STOP taking these medications       metoprolol 50 MG tablet  Commonly known as:  LOPRESSOR      TAKE these medications       allopurinol 100 MG tablet  Commonly known as:  ZYLOPRIM  Take 100 mg by mouth daily.     amLODipine 5 MG tablet  Commonly known as:  NORVASC  Take 5 mg by mouth daily.     aspirin 81 MG tablet  Take 81 mg by mouth daily.     carvedilol 25 MG tablet  Commonly known as:  COREG  Take 1 tablet (25 mg total) by mouth 2 (two) times daily with a meal.     COLCRYS 0.6 MG tablet  Generic drug:  colchicine  Take 1 tablet by mouth 2 (two) times daily.     furosemide 40 MG tablet  Commonly known as:  LASIX  Take 40 mg by mouth 2 (two) times daily.     ibuprofen 200 MG tablet   Commonly known as:  ADVIL,MOTRIN  Take 1 tablet (200 mg total) by mouth every 6 (six) hours as needed for fever, headache, mild pain or moderate pain.     lisinopril 40 MG tablet  Commonly known as:  PRINIVIL,ZESTRIL  Take 1 tablet (40 mg total) by mouth daily.     lovastatin 40 MG tablet  Commonly known as:  MEVACOR  Take 1 tablet by mouth at bedtime.     omeprazole 20 MG capsule  Commonly known as:  PRILOSEC  Take 20 mg by mouth daily. For gas/heartburn     potassium chloride 10 MEQ tablet  Commonly  known as:  K-DUR  Take 1 tablet (10 mEq total) by mouth 2 (two) times daily.       Follow-up Information   Follow up with [facility]. (For Occupational and Physical Therapies)    Contact information:   [address] [place] [address] [phone]       Follow up with [facility], MD. Schedule an appointment as soon as possible for a visit in 3 days. (To be seen with repeat labs (CBC & BMP))    Specialty:  Internal Medicine   Contact information:   [place]  [phone]        The results of significant diagnostics from this hospitalization (including imaging, microbiology, ancillary and laboratory) are listed below for reference.    Significant Diagnostic Studies: Dg Chest 2 View  07/03/2013   CLINICAL DATA:  Cough and congestion  EXAM: CHEST  2 VIEW  COMPARISON:  07/29/2012  FINDINGS: Cardiac shadow is within normal limits. A defibrillator is again noted. Mild interstitial changes are seen without focal confluent infiltrate. Postsurgical changes are noted in the left axilla.  IMPRESSION: No acute abnormality noted.   Electronically Signed   By: Inez Catalina M.D.   On: 07/03/2013 13:14   Dg Wrist 2 Views Left  07/03/2013   CLINICAL DATA:  Left wrist pain and swelling.  EXAM: LEFT WRIST - 2 VIEW  COMPARISON:  None.  FINDINGS: Diffuse soft tissue swelling.  Unremarkable bones.  IMPRESSION: Diffuse soft  tissue swelling without underlying bony abnormality.   Electronically Signed   By: Enrique Sack M.D.   On: 07/03/2013 20:03   Dg Wrist 2 Views Right  07/03/2013   CLINICAL DATA:  Bilateral wrist pain and swelling.  EXAM: RIGHT WRIST - 2 VIEW  COMPARISON:  None.  FINDINGS: Dorsal soft tissue swelling.  Unremarkable bones.  IMPRESSION: Dorsal soft tissue swelling without underlying bony abnormality.   Electronically Signed   By: Enrique Sack M.D.   On: 07/03/2013 20:02    Microbiology: Recent Results (from the past 240 hour(s))  MRSA PCR SCREENING     Status: None   Collection Time    07/03/13 11:44 PM      Result Value Ref Range Status   MRSA by PCR NEGATIVE  NEGATIVE Final   Comment:            The GeneXpert MRSA Assay (FDA     approved for NASAL specimens     only), is one component of a     comprehensive MRSA colonization     surveillance program. It is not     intended to diagnose MRSA     infection nor to guide or     monitor treatment for     MRSA infections.     Labs: Basic Metabolic Panel:  Recent Labs Lab 07/03/13 1200 07/03/13 1840 07/04/13 0705 07/05/13 0515 07/06/13 0524  NA 143  --  140 139 139  K 2.6*  --  3.4* 3.1* 4.1  CL 97  --  96 95* 96  CO2 32  --  '31 28 30  ' GLUCOSE 123*  --  97 84 94  BUN 14  --  '11 13 15  ' CREATININE 0.62 0.66 0.66 0.60 0.67  CALCIUM 9.1  --  9.1 9.0 9.5   Liver Function Tests: No results found for this basename: AST, ALT, ALKPHOS, BILITOT, PROT, ALBUMIN,  in the last 168 hours No results found for this  basename: LIPASE, AMYLASE,  in the last 168 hours No results found for this basename: AMMONIA,  in the last 168 hours CBC:  Recent Labs Lab 07/03/13 1200 07/03/13 1840 07/04/13 0705 07/05/13 0515 07/06/13 0524  WBC 5.7 4.8 5.2 5.1 6.4  HGB 8.5* 8.3* 8.2* 7.7* 8.5*  HCT 27.6* 26.8* 26.6* 25.1* 27.4*  MCV 71.1* 71.7* 71.5* 70.9* 71.0*  PLT 307 315 290 281 305   Cardiac Enzymes:  Recent Labs Lab 07/03/13 1840  07/04/13 0155 07/04/13 0705  TROPONINI <0.30 <0.30 <0.30   BNP: BNP (last 3 results) No results found for this basename: PROBNP,  in the last 8760 hours CBG: No results found for this basename: GLUCAP,  in the last 168 hours  Additional labs: 1. CRP: 12.1 2. ESR: 134 3. TSH: 0.501 4. ANA: Positive, cyclic citrullin Peptide: 45.5 5. Rheumatoid factor: 99 6. 2-D echo 07/04/13: Study Conclusions  - Left ventricle: The cavity size was normal. Wall thickness was normal. Severe global hypokinesis with inferiorand latereal akinesis. Systolic function was moderately to severely reduced. The estimated ejection fraction was in the range of 30% to 35%. Doppler parameters are consistent with pseudonormal left ventricular relaxation (grade2 diastolic dysfunction). The E/A ratio is >1.5. The E/e' ratio is >15, suggesting markedly elevated LV filling pressure. - Aorta: The aorta was mildly calcified. - Mitral valve: Mildly thickened leaflets . Some tethering of the posterior leaflet. Mild, posteriorly directed regurgitation. - Left atrium: Moderately dilated (37 ml/m2). - Right ventricle: The cavity size was normal. AICD wire noted in right ventricle. - Right atrium: The atrium was normal in size. AICDwire noted in right atrium. - Atrial septum: A patent foramen ovale cannot be excluded by color doppler. - Inferior vena cava: The vessel was normal in size; the respirophasic diameter changes were in the normal range (= 50%); findings are consistent with normal central venous pressure. - Pericardium, extracardiac: There was no pericardial effusion.     Signed:  Vernell Leep, MD, FACP, FHM. [facility] Pager [phone]  If 7PM-7AM, please contact night-coverage [email or web address] Password [facility] 07/06/2013, 12:22 PM

## 2013-07-17 ENCOUNTER — Inpatient Hospital Stay (HOSPITAL_COMMUNITY)
Admission: EM | Admit: 2013-07-17 | Discharge: 2013-07-21 | DRG: 871 | Disposition: A | Discharge status: Home / Self Care | Payer: Medicare HMO | Attending: Cardiology | Admitting: Cardiology

## 2013-07-17 ENCOUNTER — Emergency Department (HOSPITAL_COMMUNITY): Payer: Medicare HMO

## 2013-07-17 ENCOUNTER — Encounter (HOSPITAL_COMMUNITY): Payer: Self-pay | Admitting: Emergency Medicine

## 2013-07-17 DIAGNOSIS — I509 Heart failure, unspecified: Secondary | ICD-10-CM | POA: Diagnosis present

## 2013-07-17 DIAGNOSIS — I5022 Chronic systolic (congestive) heart failure: Secondary | ICD-10-CM

## 2013-07-17 DIAGNOSIS — Z95810 Presence of automatic (implantable) cardiac defibrillator: Secondary | ICD-10-CM

## 2013-07-17 DIAGNOSIS — E785 Hyperlipidemia, unspecified: Secondary | ICD-10-CM | POA: Diagnosis present

## 2013-07-17 DIAGNOSIS — M109 Gout, unspecified: Secondary | ICD-10-CM | POA: Diagnosis present

## 2013-07-17 DIAGNOSIS — H9190 Unspecified hearing loss, unspecified ear: Secondary | ICD-10-CM | POA: Diagnosis present

## 2013-07-17 DIAGNOSIS — M069 Rheumatoid arthritis, unspecified: Secondary | ICD-10-CM

## 2013-07-17 DIAGNOSIS — J111 Influenza due to unidentified influenza virus with other respiratory manifestations: Secondary | ICD-10-CM

## 2013-07-17 DIAGNOSIS — I2510 Atherosclerotic heart disease of native coronary artery without angina pectoris: Secondary | ICD-10-CM | POA: Diagnosis present

## 2013-07-17 DIAGNOSIS — E440 Moderate protein-calorie malnutrition: Secondary | ICD-10-CM | POA: Diagnosis present

## 2013-07-17 DIAGNOSIS — N179 Acute kidney failure, unspecified: Secondary | ICD-10-CM

## 2013-07-17 DIAGNOSIS — D649 Anemia, unspecified: Secondary | ICD-10-CM | POA: Diagnosis present

## 2013-07-17 DIAGNOSIS — J1100 Influenza due to unidentified influenza virus with unspecified type of pneumonia: Secondary | ICD-10-CM | POA: Diagnosis present

## 2013-07-17 DIAGNOSIS — J13 Pneumonia due to Streptococcus pneumoniae: Secondary | ICD-10-CM | POA: Diagnosis present

## 2013-07-17 DIAGNOSIS — M129 Arthropathy, unspecified: Secondary | ICD-10-CM | POA: Diagnosis present

## 2013-07-17 DIAGNOSIS — Z79899 Other long term (current) drug therapy: Secondary | ICD-10-CM

## 2013-07-17 DIAGNOSIS — I498 Other specified cardiac arrhythmias: Secondary | ICD-10-CM | POA: Diagnosis not present

## 2013-07-17 DIAGNOSIS — R197 Diarrhea, unspecified: Secondary | ICD-10-CM | POA: Diagnosis present

## 2013-07-17 DIAGNOSIS — J189 Pneumonia, unspecified organism: Secondary | ICD-10-CM

## 2013-07-17 DIAGNOSIS — Z96659 Presence of unspecified artificial knee joint: Secondary | ICD-10-CM

## 2013-07-17 DIAGNOSIS — I10 Essential (primary) hypertension: Secondary | ICD-10-CM | POA: Diagnosis present

## 2013-07-17 DIAGNOSIS — Z87891 Personal history of nicotine dependence: Secondary | ICD-10-CM

## 2013-07-17 DIAGNOSIS — Z853 Personal history of malignant neoplasm of breast: Secondary | ICD-10-CM

## 2013-07-17 DIAGNOSIS — A419 Sepsis, unspecified organism: Principal | ICD-10-CM

## 2013-07-17 DIAGNOSIS — Z7982 Long term (current) use of aspirin: Secondary | ICD-10-CM

## 2013-07-17 LAB — INFLUENZA PANEL BY PCR (TYPE A & B)
H1N1FLUPCR: NOT DETECTED
Influenza A By PCR: NEGATIVE
Influenza B By PCR: POSITIVE — AB

## 2013-07-17 LAB — URINALYSIS, ROUTINE W REFLEX MICROSCOPIC
Bilirubin Urine: NEGATIVE
Glucose, UA: NEGATIVE mg/dL
Ketones, ur: NEGATIVE mg/dL
Nitrite: NEGATIVE
PROTEIN: NEGATIVE mg/dL
Specific Gravity, Urine: 1.01 (ref 1.005–1.030)
UROBILINOGEN UA: 0.2 mg/dL (ref 0.0–1.0)
pH: 5.5 (ref 5.0–8.0)

## 2013-07-17 LAB — COMPREHENSIVE METABOLIC PANEL
ALBUMIN: 2.5 g/dL — AB (ref 3.5–5.2)
ALT: 24 U/L (ref 0–35)
AST: 21 U/L (ref 0–37)
Alkaline Phosphatase: 92 U/L (ref 39–117)
BUN: 34 mg/dL — ABNORMAL HIGH (ref 6–23)
CO2: 32 mEq/L (ref 19–32)
Calcium: 8.5 mg/dL (ref 8.4–10.5)
Chloride: 97 mEq/L (ref 96–112)
Creatinine, Ser: 1.21 mg/dL — ABNORMAL HIGH (ref 0.50–1.10)
GFR calc Af Amer: 50 mL/min — ABNORMAL LOW (ref >90)
GFR calc non Af Amer: 43 mL/min — ABNORMAL LOW (ref >90)
Glucose, Bld: 95 mg/dL (ref 70–99)
POTASSIUM: 4.6 meq/L (ref 3.7–5.3)
SODIUM: 142 meq/L (ref 137–147)
Total Bilirubin: 0.4 mg/dL (ref 0.3–1.2)
Total Protein: 7.4 g/dL (ref 6.0–8.3)

## 2013-07-17 LAB — CBC WITH DIFFERENTIAL/PLATELET
BASOS ABS: 0 10*3/uL (ref 0.0–0.1)
BASOS PCT: 0 % (ref 0–1)
Eosinophils Absolute: 0 10*3/uL (ref 0.0–0.7)
Eosinophils Relative: 0 % (ref 0–5)
HEMATOCRIT: 31.2 % — AB (ref 36.0–46.0)
Hemoglobin: 9.6 g/dL — ABNORMAL LOW (ref 12.0–15.0)
Lymphocytes Relative: 8 % — ABNORMAL LOW (ref 12–46)
Lymphs Abs: 1.4 10*3/uL (ref 0.7–4.0)
MCH: 22.1 pg — ABNORMAL LOW (ref 26.0–34.0)
MCHC: 30.8 g/dL (ref 30.0–36.0)
MCV: 71.7 fL — ABNORMAL LOW (ref 78.0–100.0)
MONO ABS: 0.9 10*3/uL (ref 0.1–1.0)
Monocytes Relative: 6 % (ref 3–12)
NEUTROS ABS: 14 10*3/uL — AB (ref 1.7–7.7)
Neutrophils Relative %: 86 % — ABNORMAL HIGH (ref 43–77)
PLATELETS: 282 10*3/uL (ref 150–400)
RBC: 4.35 MIL/uL (ref 3.87–5.11)
RDW: 20.6 % — ABNORMAL HIGH (ref 11.5–15.5)
WBC: 16.3 10*3/uL — AB (ref 4.0–10.5)

## 2013-07-17 LAB — LACTIC ACID, PLASMA: Lactic Acid, Venous: 2.2 mmol/L (ref 0.5–2.2)

## 2013-07-17 LAB — PROTIME-INR
INR: 1.25 (ref 0.00–1.49)
PROTHROMBIN TIME: 15.4 s — AB (ref 11.6–15.2)

## 2013-07-17 LAB — URINE MICROSCOPIC-ADD ON

## 2013-07-17 LAB — APTT: aPTT: 25 seconds (ref 24–37)

## 2013-07-17 MED ORDER — GUAIFENESIN-DM 100-10 MG/5ML PO SYRP
5.0000 mL | ORAL_SOLUTION | ORAL | Status: DC | PRN
  Administered 2013-07-17: 5 mL via ORAL
  Filled 2013-07-17 (×2): qty 5

## 2013-07-17 MED ORDER — SODIUM CHLORIDE 0.9 % IV SOLN
1000.0000 mL | Freq: Once | INTRAVENOUS | Status: AC
  Administered 2013-07-17: 1000 mL via INTRAVENOUS

## 2013-07-17 MED ORDER — ONDANSETRON HCL 4 MG PO TABS: 4.0000 mg | ORAL_TABLET | Freq: Four times a day (QID) | ORAL | Status: DC | PRN

## 2013-07-17 MED ORDER — ALBUTEROL SULFATE (2.5 MG/3ML) 0.083% IN NEBU: 2.5000 mg | INHALATION_SOLUTION | RESPIRATORY_TRACT | Status: DC | PRN

## 2013-07-17 MED ORDER — GUAIFENESIN ER 600 MG PO TB12
600.0000 mg | ORAL_TABLET | Freq: Two times a day (BID) | ORAL | Status: DC
  Administered 2013-07-17 – 2013-07-21 (×8): 600 mg via ORAL
  Filled 2013-07-17 (×9): qty 1

## 2013-07-17 MED ORDER — VANCOMYCIN HCL IN DEXTROSE 750-5 MG/150ML-% IV SOLN
750.0000 mg | Freq: Two times a day (BID) | INTRAVENOUS | Status: DC
  Administered 2013-07-18 – 2013-07-19 (×3): 750 mg via INTRAVENOUS
  Filled 2013-07-17 (×5): qty 150

## 2013-07-17 MED ORDER — ACETAMINOPHEN 325 MG PO TABS
650.0000 mg | ORAL_TABLET | Freq: Four times a day (QID) | ORAL | Status: DC | PRN
  Administered 2013-07-18 – 2013-07-19 (×2): 650 mg via ORAL
  Filled 2013-07-17 (×2): qty 2

## 2013-07-17 MED ORDER — ONDANSETRON HCL 4 MG/2ML IJ SOLN
4.0000 mg | Freq: Once | INTRAMUSCULAR | Status: AC
  Administered 2013-07-17: 4 mg via INTRAMUSCULAR
  Filled 2013-07-17: qty 2

## 2013-07-17 MED ORDER — ACETAMINOPHEN 650 MG RE SUPP: 650.0000 mg | Freq: Four times a day (QID) | RECTAL | Status: DC | PRN

## 2013-07-17 MED ORDER — COLCHICINE 0.6 MG PO TABS
0.6000 mg | ORAL_TABLET | Freq: Every day | ORAL | Status: DC
  Administered 2013-07-18 – 2013-07-21 (×4): 0.6 mg via ORAL
  Filled 2013-07-17 (×4): qty 1

## 2013-07-17 MED ORDER — HEPARIN SODIUM (PORCINE) 5000 UNIT/ML IJ SOLN
5000.0000 [IU] | Freq: Three times a day (TID) | INTRAMUSCULAR | Status: DC
  Administered 2013-07-17 – 2013-07-21 (×11): 5000 [IU] via SUBCUTANEOUS
  Filled 2013-07-17 (×15): qty 1

## 2013-07-17 MED ORDER — VANCOMYCIN HCL IN DEXTROSE 1-5 GM/200ML-% IV SOLN
1000.0000 mg | Freq: Once | INTRAVENOUS | Status: AC
  Administered 2013-07-17: 1000 mg via INTRAVENOUS
  Filled 2013-07-17: qty 200

## 2013-07-17 MED ORDER — SODIUM CHLORIDE 0.9 % IV SOLN
1000.0000 mL | INTRAVENOUS | Status: DC
  Administered 2013-07-17: 1000 mL via INTRAVENOUS

## 2013-07-17 MED ORDER — SACCHAROMYCES BOULARDII 250 MG PO CAPS
250.0000 mg | ORAL_CAPSULE | Freq: Two times a day (BID) | ORAL | Status: DC
  Administered 2013-07-17 – 2013-07-21 (×8): 250 mg via ORAL
  Filled 2013-07-17 (×9): qty 1

## 2013-07-17 MED ORDER — SODIUM CHLORIDE 0.9 % IJ SOLN
3.0000 mL | Freq: Two times a day (BID) | INTRAMUSCULAR | Status: DC
  Administered 2013-07-18 – 2013-07-21 (×6): 3 mL via INTRAVENOUS

## 2013-07-17 MED ORDER — ACETAMINOPHEN 325 MG PO TABS
650.0000 mg | ORAL_TABLET | Freq: Once | ORAL | Status: AC
  Administered 2013-07-17: 650 mg via ORAL
  Filled 2013-07-17: qty 2

## 2013-07-17 MED ORDER — PIPERACILLIN-TAZOBACTAM 3.375 G IVPB 30 MIN
3.3750 g | Freq: Once | INTRAVENOUS | Status: AC
  Administered 2013-07-17: 3.375 g via INTRAVENOUS
  Filled 2013-07-17 (×2): qty 50

## 2013-07-17 MED ORDER — OSELTAMIVIR PHOSPHATE 75 MG PO CAPS
75.0000 mg | ORAL_CAPSULE | Freq: Two times a day (BID) | ORAL | Status: DC
  Administered 2013-07-17 – 2013-07-21 (×8): 75 mg via ORAL
  Filled 2013-07-17 (×10): qty 1

## 2013-07-17 MED ORDER — SODIUM CHLORIDE 0.9 % IV SOLN
INTRAVENOUS | Status: AC
  Administered 2013-07-17: 75 mL/h via INTRAVENOUS

## 2013-07-17 MED ORDER — ALLOPURINOL 100 MG PO TABS
100.0000 mg | ORAL_TABLET | Freq: Every day | ORAL | Status: DC
  Administered 2013-07-18 – 2013-07-21 (×4): 100 mg via ORAL
  Filled 2013-07-17 (×4): qty 1

## 2013-07-17 MED ORDER — ONDANSETRON HCL 4 MG/2ML IJ SOLN: 4.0000 mg | Freq: Four times a day (QID) | INTRAMUSCULAR | Status: DC | PRN

## 2013-07-17 MED ORDER — ASPIRIN 81 MG PO CHEW
81.0000 mg | CHEWABLE_TABLET | Freq: Every day | ORAL | Status: DC
  Administered 2013-07-18 – 2013-07-21 (×4): 81 mg via ORAL
  Filled 2013-07-17 (×4): qty 1

## 2013-07-17 MED ORDER — PIPERACILLIN-TAZOBACTAM 3.375 G IVPB
3.3750 g | Freq: Three times a day (TID) | INTRAVENOUS | Status: DC
  Administered 2013-07-18 – 2013-07-20 (×9): 3.375 g via INTRAVENOUS
  Filled 2013-07-17 (×14): qty 50

## 2013-07-17 NOTE — H&P (Signed)
[facility] History and Physical  [name] DXA:[id number] DOB: Oct 07, 1940 DOA: 07/17/2013   PCP: [name], MD  Specialists: Unknown  Chief Complaint: Cough for the last 3 days  HPI: [name] is a 73 y.o. female with a past medical history of chronic systolic congestive heart failure with EF of about 30-35%, left bundle branch block, biventricular ICD implantation, recently diagnosed arthritis, presumed to be gout, who was admitted to [facility] on March 13 with complaints of chest pain. She was evaluated by cardiology. She was ruled out and was subsequently discharged. Patient is a very poor historian probably due to cognitive impairment. She is extremely hard of hearing on top of that and so history was extremely limited. Apparently, she was brought in due to fatigue for the last 3 days. Apparently, she's been coughing as well and has reported some expectoration, although she was unable to tell me the same. She's had a couple episodes of vomiting. She's had 3 episodes of loose stool today, but denies any abdominal pain. She denies any chest pain currently. She says her breathing is better since he has been in the emergency department. Based on the ED provider notes it appears that the family reported that the patient was diagnosed with flu last week. She is unable to provide this history to me. She was also diagnosed with some form of arthritis, presumably gout, and that seems to be better according to the patient.  Home Medications: Prior to Admission medications   Medication Sig Start Date End Date Taking? Authorizing Provider  amLODipine (NORVASC) 5 MG tablet Take 5 mg by mouth daily.   Yes Historical Provider, MD  aspirin 81 MG tablet Take 81 mg by mouth daily.   Yes Historical Provider, MD  carvedilol (COREG) 25 MG tablet Take 1 tablet (25 mg total) by mouth 2 (two) times daily with a meal. 05/20/12  Yes Lendon Colonel, NP  colchicine 0.6 MG tablet Take 0.6  mg by mouth 2 (two) times daily.   Yes Historical Provider, MD  furosemide (LASIX) 40 MG tablet Take 40 mg by mouth 2 (two) times daily.   Yes Historical Provider, MD  ibuprofen (ADVIL,MOTRIN) 200 MG tablet Take 1 tablet (200 mg total) by mouth every 6 (six) hours as needed for fever, headache, mild pain or moderate pain. 07/06/13  Yes Modena Jansky, MD  lisinopril (PRINIVIL,ZESTRIL) 40 MG tablet Take 1 tablet (40 mg total) by mouth daily. 06/12/13  Yes Evans Lance, MD  omeprazole (PRILOSEC) 20 MG capsule Take 20 mg by mouth daily. For gas/heartburn   Yes Historical Provider, MD  potassium chloride (K-DUR) 10 MEQ tablet Take 1 tablet (10 mEq total) by mouth 2 (two) times daily. 07/06/13  Yes Modena Jansky, MD  allopurinol (ZYLOPRIM) 100 MG tablet Take 100 mg by mouth daily.    Historical Provider, MD  lovastatin (MEVACOR) 40 MG tablet Take 1 tablet by mouth at bedtime. 10/21/12   Historical Provider, MD    Allergies: No Known Allergies  Past Medical History: Past Medical History  Diagnosis Date  . Arteriosclerotic cardiovascular disease (ASCVD)     Remote PTCA by patient report; LBBB; associated cardiomyopathy, presumed ischemic with EF 40-45% previously, 20% in 06/2009; h/o clinical congestive heart failure; negative stress nuclear in 2009 with inferoseptal and apical scar  . Anemia     Hgb of 9-10  . Hyperlipidemia   . Breast cancer   . Hypertension   . Dysrhythmia  LBBB  . Automatic implantable cardioverter-defibrillator in situ   . Pneumonia     "twice" (07/03/2013)  . Arthritis     "arms" (07/03/2013)  . HOH (hard of hearing)     Past Surgical History  Procedure Laterality Date  . Total knee arthroplasty Right     Dr.Harrison  . Bi-ventricular implantable cardioverter defibrillator  (crt-d)  07/28/2012  . Cataract extraction w/ intraocular lens implant Left   . Tubal ligation    . Breast biopsy Bilateral   . Mastectomy Left 1998    Social History: Her living  situation is unclear. Activity level is unknown. No known history of smoking. Currently no alcohol use.  Family History: patient was unable to provide this history  Review of Systems - very difficult to obtain review of system, considering her hearing impairment and possible cognitive deficits.  Physical Examination  Filed Vitals:   07/17/13 1803 07/17/13 1940  BP: 98/34 111/36  Pulse: 89   Temp: 101.9 F (38.8 C)   TempSrc: Rectal   Resp: 22 17  SpO2: 94% 95%    BP 111/36  Pulse 89  Temp(Src) 101.9 F (38.8 C) (Rectal)  Resp 17  SpO2 95%  General appearance: alert, cooperative, appears stated age and no distress Head: Normocephalic, without obvious abnormality, atraumatic Eyes: conjunctivae/corneas clear. PERRL, EOM's intact. Throat: slightly dry mm Neck: no adenopathy, no carotid bruit, no JVD, supple, symmetrical, trachea midline and thyroid not enlarged, symmetric, no tenderness/mass/nodules Resp: Crackles bilaterally, but more so on the left. No rhonchi. No wheezing. Chest wall: Implanted defibrillator is palpated Cardio: regular rate and rhythm, S1, S2 normal, no murmur, click, rub or gallop GI: soft, non-tender; bowel sounds normal; no masses,  no organomegaly Extremities: extremities normal, atraumatic, no cyanosis or edema Pulses: 2+ and symmetric Skin: Skin color, texture, turgor normal. No rashes or lesions Lymph nodes: Cervical, supraclavicular, and axillary nodes normal. Neurologic: She is alert. Extremely hard of hearing. No facial droop. Tongue is midline. Motor strength is equal, bilateral upper and lower extremity. Could not answer orientation questions. She thought she was still in [place].  Laboratory Data: Results for orders placed during the hospital encounter of 07/17/13 (from the past 48 hour(s))  CBC WITH DIFFERENTIAL     Status: Abnormal   Collection Time    07/17/13  6:56 PM      Result Value Ref Range   WBC 16.3 (*) 4.0 - 10.5 K/uL   RBC  4.35  3.87 - 5.11 MIL/uL   Hemoglobin 9.6 (*) 12.0 - 15.0 g/dL   HCT 31.2 (*) 36.0 - 46.0 %   MCV 71.7 (*) 78.0 - 100.0 fL   MCH 22.1 (*) 26.0 - 34.0 pg   MCHC 30.8  30.0 - 36.0 g/dL   RDW 20.6 (*) 11.5 - 15.5 %   Platelets 282  150 - 400 K/uL   Neutrophils Relative % 86 (*) 43 - 77 %   Neutro Abs 14.0 (*) 1.7 - 7.7 K/uL   Lymphocytes Relative 8 (*) 12 - 46 %   Lymphs Abs 1.4  0.7 - 4.0 K/uL   Monocytes Relative 6  3 - 12 %   Monocytes Absolute 0.9  0.1 - 1.0 K/uL   Eosinophils Relative 0  0 - 5 %   Eosinophils Absolute 0.0  0.0 - 0.7 K/uL   Basophils Relative 0  0 - 1 %   Basophils Absolute 0.0  0.0 - 0.1 K/uL  COMPREHENSIVE METABOLIC PANEL  Status: Abnormal   Collection Time    07/17/13  6:56 PM      Result Value Ref Range   Sodium 142  137 - 147 mEq/L   Potassium 4.6  3.7 - 5.3 mEq/L   Chloride 97  96 - 112 mEq/L   CO2 32  19 - 32 mEq/L   Glucose, Bld 95  70 - 99 mg/dL   BUN 34 (*) 6 - 23 mg/dL   Creatinine, Ser 1.21 (*) 0.50 - 1.10 mg/dL   Calcium 8.5  8.4 - 10.5 mg/dL   Total Protein 7.4  6.0 - 8.3 g/dL   Albumin 2.5 (*) 3.5 - 5.2 g/dL   AST 21  0 - 37 U/L   ALT 24  0 - 35 U/L   Alkaline Phosphatase 92  39 - 117 U/L   Total Bilirubin 0.4  0.3 - 1.2 mg/dL   GFR calc non Af Amer 43 (*) >90 mL/min   GFR calc Af Amer 50 (*) >90 mL/min   Comment: (NOTE)     The eGFR has been calculated using the CKD EPI equation.     This calculation has not been validated in all clinical situations.     eGFR's persistently <90 mL/min signify possible Chronic Kidney     Disease.  PROTIME-INR     Status: Abnormal   Collection Time    07/17/13  6:56 PM      Result Value Ref Range   Prothrombin Time 15.4 (*) 11.6 - 15.2 seconds   INR 1.25  0.00 - 1.49  APTT     Status: None   Collection Time    07/17/13  6:56 PM      Result Value Ref Range   aPTT 25  24 - 37 seconds  CULTURE, BLOOD (ROUTINE X 2)     Status: None   Collection Time    07/17/13  7:30 PM      Result Value Ref  Range   Specimen Description Blood BLOOD RIGHT WRIST     Special Requests BOTTLES DRAWN AEROBIC ONLY 5CC     Culture PENDING     Report Status PENDING      Radiology Reports: Dg Chest Port 1 View  07/17/2013   CLINICAL DATA:  Fever and cough.  EXAM: PORTABLE CHEST - 1 VIEW  COMPARISON:  DG CHEST 2 VIEW dated 07/03/2013  FINDINGS: Left cardiac ICD appears stable. There is mild enlargement of the heart which appears unchanged. Densities at the left lung base and the left hemidiaphragm is obscured. Slightly enlarged interstitial densities at the right lung base. Negative for a pneumothorax.  IMPRESSION: Left basilar densities are concerning for airspace disease and small pleural effusion. Findings could represent pneumonia.  There may be mild dependent edema.   Electronically Signed   By: Markus Daft M.D.   On: 07/17/2013 18:53    Problem List  Principal Problem:   HCAP (healthcare-associated pneumonia) Active Problems:   Chronic systolic heart failure   Biventricular ICD (implantable cardioverter-defibrillator) in place   Sepsis   Assessment: This is a 73 year old , African American female, who presents with cough, ongoing for 3 days with shortness of breath. She is diagnosed as having pneumonia predominantly affecting the left side. She is febrile, with elevated WBC and with low blood pressures. This is most likely healthcare associated pneumonia with sepsis. Differential diagnoses include influenza. Considering her diarrhea she could have C. difficile as well, although it could just be due to her  acute illness.  Plan: #1 healthcare associated pneumonia with sepsis: She'll be admitted to step down. Blood pressure is improved with IV bolus. She'll be placed on broad-spectrum antibiotics with vancomycin and Zosyn. Influenza PCR will be checked. Lactic acid level is pending. Blood cultures have been obtained. Oxygen will be provided. No previous documentation of steroid use in the last one  year.  #2 diarrhea: Will order C difficile PCR. Monitor closely.  #3 history of chronic systolic CHF with the biventricular ICD: Monitor fluid status closely. Avoid over hydration. She'll be monitored in step down unit for now. Hold cardiac medications for now due to low blood pressure. This can be resumed as clinical status improves.  #4 recently diagnosed gouty arthritis: This appears to be better. Continue with colchicine and allopurinol.  #5 acute renal failure: This is mild and possibly from sepsis. Give IV fluids. Hold her antihypertensive medication due to borderline low blood pressure. Recheck renal function in AM. Monitor urine output closely.  #6 microcytic anemia: This appears to be close to baseline. Continue to monitor   DVT Prophylaxis: Heparin Code Status: Full code Family Communication: No family at bedside  Disposition Plan: Admit to step down. Dr. Legrand Rams to assume care in the morning.   Further management decisions will depend on results of further testing and patient's response to treatment.   [facility]  [facility] Pager [phone]  If 7PM-7AM, please contact night-coverage [email or web address] Password [facility]  07/17/2013, 8:27 PM

## 2013-07-17 NOTE — ED Notes (Signed)
Pt's niece, Isaac Laud left her home and cell number and wants to be notified of pt's condition Cell [phone], home [phone]

## 2013-07-17 NOTE — ED Notes (Signed)
Dx with flu last week. Pt was too weak to walk today. Pt states she has not eaten today. Pt is very hard of hearing. Family is enroute, per EMS.

## 2013-07-17 NOTE — ED Provider Notes (Signed)
CSN: [id number]     Arrival date & time 07/17/13  1737 History  This chart was scribed for Charles B. Karle Starch, MD by Eston Mould, ED Scribe. This patient was seen in room APA18/APA18 and the patient's care was started at 6:04 PM.   Chief Complaint  Patient presents with  . Fatigue   The history is provided by the patient. The history is limited by the condition of the patient. No language interpreter was used.   HPI Comments: Level 5 caveat due to hard of hearing [name] is a 73 y.o. female with a hx of ASCVD, HTN, and hyperlipidemia brought in by EMS who presents to the Emergency Department complaining of ongoing fatigue that began 3 days ago. Pt states she was diagnosed with the flu last week by. She states she has been too weak to walk and eat today due to fatigue. Pt states she has been coughing and reports having some sputum. She states she has been having emesis and diarrhea. Pt states she has only been able to drink orange juice but reports having an episode of emesis shortly after.  Past Medical History  Diagnosis Date  . Arteriosclerotic cardiovascular disease (ASCVD)     Remote PTCA by patient report; LBBB; associated cardiomyopathy, presumed ischemic with EF 40-45% previously, 20% in 06/2009; h/o clinical congestive heart failure; negative stress nuclear in 2009 with inferoseptal and apical scar  . Anemia     Hgb of 9-10  . Hyperlipidemia   . Breast cancer   . Hypertension   . Dysrhythmia     LBBB  . Automatic implantable cardioverter-defibrillator in situ   . Pneumonia     "twice" (07/03/2013)  . Arthritis     "arms" (07/03/2013)  . HOH (hard of hearing)    Past Surgical History  Procedure Laterality Date  . Total knee arthroplasty Right     Dr.Harrison  . Bi-ventricular implantable cardioverter defibrillator  (crt-d)  07/28/2012  . Cataract extraction w/ intraocular lens implant Left   . Tubal ligation    . Breast biopsy Bilateral   . Mastectomy Left  1998   No family history on file. History  Substance Use Topics  . Smoking status: Former Smoker    Types: Cigarettes  . Smokeless tobacco: Current User    Types: Chew     Comment: 07/03/2013 "smoked some; don't know how much or how long or when I quit"  . Alcohol Use: No   OB History   Grav Para Term Preterm Abortions TAB SAB Ect Mult Living                 Review of Systems  Unable to perform ROS: Other   Allergies  Review of patient's allergies indicates no known allergies.  Home Medications   Current Outpatient Rx  Name  Route  Sig  Dispense  Refill  . allopurinol (ZYLOPRIM) 100 MG tablet   Oral   Take 100 mg by mouth daily.         [name] amLODipine (NORVASC) 5 MG tablet   Oral   Take 5 mg by mouth daily.         [name] aspirin 81 MG tablet   Oral   Take 81 mg by mouth daily.         . carvedilol (COREG) 25 MG tablet   Oral   Take 1 tablet (25 mg total) by mouth 2 (two) times daily with a meal.   60 tablet  5   . COLCRYS 0.6 MG tablet   Oral   Take 1 tablet by mouth 2 (two) times daily.          . furosemide (LASIX) 40 MG tablet   Oral   Take 40 mg by mouth 2 (two) times daily.         [name] ibuprofen (ADVIL,MOTRIN) 200 MG tablet   Oral   Take 1 tablet (200 mg total) by mouth every 6 (six) hours as needed for fever, headache, mild pain or moderate pain.         [name] lisinopril (PRINIVIL,ZESTRIL) 40 MG tablet   Oral   Take 1 tablet (40 mg total) by mouth daily.   30 tablet   0     PT OVERDUE FOR FOLLOW UP APT WITH DR Burna ...   . lovastatin (MEVACOR) 40 MG tablet   Oral   Take 1 tablet by mouth at bedtime.         [name] omeprazole (PRILOSEC) 20 MG capsule   Oral   Take 20 mg by mouth daily. For gas/heartburn         . potassium chloride (K-DUR) 10 MEQ tablet   Oral   Take 1 tablet (10 mEq total) by mouth 2 (two) times daily.   60 tablet   0    BP 98/34  Pulse 89  Temp(Src) 101.9 F (38.8 C) (Rectal)  Resp 22  SpO2  94%  Physical Exam  Nursing note and vitals reviewed. Constitutional: She is oriented to person, place, and time. She appears well-developed and well-nourished.  HENT:  Head: Normocephalic and atraumatic.  Dry mouth.  Eyes: EOM are normal. Pupils are equal, round, and reactive to light.  Neck: Normal range of motion. Neck supple.  Cardiovascular: Normal rate, normal heart sounds and intact distal pulses.   Pulmonary/Chest: Effort normal. She has rales.  Rales to bilateral bases.  Abdominal: Bowel sounds are normal. She exhibits no distension. There is no tenderness.  Musculoskeletal: Normal range of motion. She exhibits no edema and no tenderness.  Neurological: She is alert and oriented to person, place, and time. She has normal strength. No cranial nerve deficit or sensory deficit.  Skin: Skin is warm and dry. No rash noted.  Psychiatric: She has a normal mood and affect.   ED Course  Procedures   CRITICAL CARE Performed by: Truddie Hidden. Total critical care time: 45 Critical care time was exclusive of separately billable procedures and treating other patients. Critical care was necessary to treat or prevent imminent or life-threatening deterioration. Critical care was time spent personally by me on the following activities: development of treatment plan with patient and/or surrogate as well as nursing, discussions with consultants, evaluation of patient's response to treatment, examination of patient, obtaining history from patient or surrogate, ordering and performing treatments and interventions, ordering and review of laboratory studies, ordering and review of radiographic studies, pulse oximetry and re-evaluation of patient's condition.   DIAGNOSTIC STUDIES: Oxygen Saturation is 94% on RA, adequate by my interpretation.    COORDINATION OF CARE: 6:06 PM-Discussed treatment plan which includes administer IV fluids and labs. Pt agreed to plan.   Labs Review Labs Reviewed   CBC WITH DIFFERENTIAL - Abnormal; Notable for the following:    WBC 16.3 (*)    Hemoglobin 9.6 (*)    HCT 31.2 (*)    MCV 71.7 (*)    MCH 22.1 (*)    RDW 20.6 (*)    Neutrophils  Relative % 86 (*)    Neutro Abs 14.0 (*)    Lymphocytes Relative 8 (*)    All other components within normal limits  COMPREHENSIVE METABOLIC PANEL - Abnormal; Notable for the following:    BUN 34 (*)    Creatinine, Ser 1.21 (*)    Albumin 2.5 (*)    GFR calc non Af Amer 43 (*)    GFR calc Af Amer 50 (*)    All other components within normal limits  PROTIME-INR - Abnormal; Notable for the following:    Prothrombin Time 15.4 (*)    All other components within normal limits  CULTURE, BLOOD (ROUTINE X 2)  CULTURE, BLOOD (ROUTINE X 2)  URINE CULTURE  CLOSTRIDIUM DIFFICILE BY PCR  APTT  URINALYSIS, ROUTINE W REFLEX MICROSCOPIC  LACTIC ACID, PLASMA  BASIC METABOLIC PANEL   Imaging Review Dg Chest Port 1 View  07/17/2013   CLINICAL DATA:  Fever and cough.  EXAM: PORTABLE CHEST - 1 VIEW  COMPARISON:  DG CHEST 2 VIEW dated 07/03/2013  FINDINGS: Left cardiac ICD appears stable. There is mild enlargement of the heart which appears unchanged. Densities at the left lung base and the left hemidiaphragm is obscured. Slightly enlarged interstitial densities at the right lung base. Negative for a pneumothorax.  IMPRESSION: Left basilar densities are concerning for airspace disease and small pleural effusion. Findings could represent pneumonia.  There may be mild dependent edema.   Electronically Signed   By: Markus Daft M.D.   On: 07/17/2013 18:53     EKG Interpretation None     MDM   Final diagnoses:  Healthcare-associated pneumonia  Sepsis    Pt meets sepsis criteria, given initial fluid bolus with some improvement in BP. Code Sepsis initiated, given Abx of undiferentiated sepsis, but appears to be PNA. Also has doubling of Cr from recent admission.   I personally performed the services described in this  documentation, which was scribed in my presence. The recorded information has been reviewed and is accurate.      Charles B. Karle Starch, MD 07/17/13 2145

## 2013-07-17 NOTE — Progress Notes (Signed)
ANTIBIOTIC CONSULT NOTE - INITIAL  Pharmacy Consult for Vancomycin and Zosyn Indication: suspected sepsis  No Known Allergies  Patient Measurements:   Last Recorded Weight = 63.5Kg  Vital Signs: Temp: 101.9 F (38.8 C) (03/27 1803) Temp src: Rectal (03/27 1803) BP: 98/34 mmHg (03/27 1803) Pulse Rate: 89 (03/27 1803) Intake/Output from previous day:   Intake/Output from this shift:    Labs: No results found for this basename: WBC, HGB, PLT, LABCREA, CREATININE,  in the last 72 hours The CrCl is unknown because both a height and weight (above a minimum accepted value) are required for this calculation. No results found for this basename: VANCOTROUGH, Corlis Leak, VANCORANDOM, [place], GENTPEAK, GENTRANDOM, TOBRATROUGH, TOBRAPEAK, TOBRARND, AMIKACINPEAK, AMIKACINTROU, AMIKACIN,  in the last 72 hours   Microbiology: Recent Results (from the past 720 hour(s))  MRSA PCR SCREENING     Status: None   Collection Time    07/03/13 11:44 PM      Result Value Ref Range Status   MRSA by PCR NEGATIVE  NEGATIVE Final   Comment:            The GeneXpert MRSA Assay (FDA     approved for NASAL specimens     only), is one component of a     comprehensive MRSA colonization     surveillance program. It is not     intended to diagnose MRSA     infection nor to guide or     monitor treatment for     MRSA infections.    Medical History: Past Medical History  Diagnosis Date  . Arteriosclerotic cardiovascular disease (ASCVD)     Remote PTCA by patient report; LBBB; associated cardiomyopathy, presumed ischemic with EF 40-45% previously, 20% in 06/2009; h/o clinical congestive heart failure; negative stress nuclear in 2009 with inferoseptal and apical scar  . Anemia     Hgb of 9-10  . Hyperlipidemia   . Breast cancer   . Hypertension   . Dysrhythmia     LBBB  . Automatic implantable cardioverter-defibrillator in situ   . Pneumonia     "twice" (07/03/2013)  . Arthritis     "arms"  (07/03/2013)  . HOH (hard of hearing)     Medications:  Scheduled:  . [START ON 07/18/2013] vancomycin  750 mg Intravenous Q12H   Assessment: 73yo female admitted with suspected sepsis.  Initial doses of Vancomycin 1000mg  and Zosyn 3.375gm ordered to be given in ED.  Goal of Therapy:  Vancomycin trough level 15-20 mcg/ml  Plan:  F/U SCr and evaluate renal fxn (SCr 0.6 last admission) Vancomycin 750mg  IV q12hrs Zosyn 3.375gm IV q8h Adjust maintenance doses if necessary Check Vancomycin trough at steady state Monitor labs, renal fxn, and cultures  Hart Robinsons A 07/17/2013,7:02 PM

## 2013-07-18 LAB — BASIC METABOLIC PANEL
BUN: 23 mg/dL (ref 6–23)
CALCIUM: 7.8 mg/dL — AB (ref 8.4–10.5)
CO2: 29 meq/L (ref 19–32)
Chloride: 101 mEq/L (ref 96–112)
Creatinine, Ser: 0.88 mg/dL (ref 0.50–1.10)
GFR calc Af Amer: 73 mL/min — ABNORMAL LOW (ref >90)
GFR, EST NON AFRICAN AMERICAN: 63 mL/min — AB (ref >90)
Glucose, Bld: 83 mg/dL (ref 70–99)
Potassium: 4 mEq/L (ref 3.7–5.3)
SODIUM: 140 meq/L (ref 137–147)

## 2013-07-18 LAB — CBC
HCT: 26.5 % — ABNORMAL LOW (ref 36.0–46.0)
Hemoglobin: 8.1 g/dL — ABNORMAL LOW (ref 12.0–15.0)
MCH: 22.1 pg — AB (ref 26.0–34.0)
MCHC: 30.6 g/dL (ref 30.0–36.0)
MCV: 72.2 fL — ABNORMAL LOW (ref 78.0–100.0)
PLATELETS: 249 10*3/uL (ref 150–400)
RBC: 3.67 MIL/uL — AB (ref 3.87–5.11)
RDW: 20.6 % — ABNORMAL HIGH (ref 11.5–15.5)
WBC: 23.7 10*3/uL — AB (ref 4.0–10.5)

## 2013-07-18 LAB — STREP PNEUMONIAE URINARY ANTIGEN: STREP PNEUMO URINARY ANTIGEN: POSITIVE — AB

## 2013-07-18 LAB — MRSA PCR SCREENING: MRSA by PCR: NEGATIVE

## 2013-07-18 LAB — HIV ANTIBODY (ROUTINE TESTING W REFLEX): HIV: NONREACTIVE

## 2013-07-18 MED ORDER — PIPERACILLIN-TAZOBACTAM 3.375 G IVPB
INTRAVENOUS | Status: AC
  Filled 2013-07-18: qty 50

## 2013-07-18 MED ORDER — VANCOMYCIN HCL IN DEXTROSE 750-5 MG/150ML-% IV SOLN
INTRAVENOUS | Status: AC
  Filled 2013-07-18: qty 150

## 2013-07-18 NOTE — Progress Notes (Signed)
ANTIBIOTIC CONSULT NOTE - follow up  Pharmacy Consult for Vancomycin and Zosyn Indication: suspected sepsis  No Known Allergies  Patient Measurements: Height: 5\' 4"  (162.6 cm) Weight: 141 lb 15.6 oz (64.4 kg) IBW/kg (Calculated) : 54.7  Vital Signs: Temp: 97.5 F (36.4 C) (03/28 0736) Temp src: Axillary (03/28 0736) BP: 126/52 mmHg (03/28 0700) Pulse Rate: 50 (03/28 0700) Intake/Output from previous day: 03/27 0701 - 03/28 0700 In: 851.3 [I.V.:651.3; IV Piggyback:200] Out: 350 [Urine:350] Intake/Output from this shift:    Labs:  Recent Labs  07/17/13 1856 07/18/13 0443  WBC 16.3* 23.7*  HGB 9.6* 8.1*  PLT 282 249  CREATININE 1.21* 0.88   Estimated Creatinine Clearance: 48.4 ml/min (by C-G formula based on Cr of 0.88). No results found for this basename: VANCOTROUGH, Corlis Leak, VANCORANDOM, [place], GENTPEAK, GENTRANDOM, TOBRATROUGH, TOBRAPEAK, TOBRARND, AMIKACINPEAK, AMIKACINTROU, AMIKACIN,  in the last 72 hours   Microbiology: Recent Results (from the past 720 hour(s))  MRSA PCR SCREENING     Status: None   Collection Time    07/03/13 11:44 PM      Result Value Ref Range Status   MRSA by PCR NEGATIVE  NEGATIVE Final   Comment:            The GeneXpert MRSA Assay (FDA     approved for NASAL specimens     only), is one component of a     comprehensive MRSA colonization     surveillance program. It is not     intended to diagnose MRSA     infection nor to guide or     monitor treatment for     MRSA infections.  CULTURE, BLOOD (ROUTINE X 2)     Status: None   Collection Time    07/17/13  7:30 PM      Result Value Ref Range Status   Specimen Description Blood BLOOD RIGHT WRIST   Final   Special Requests BOTTLES DRAWN AEROBIC ONLY 5CC   Final   Culture PENDING   Incomplete   Report Status PENDING   Incomplete  MRSA PCR SCREENING     Status: None   Collection Time    07/17/13 10:00 PM      Result Value Ref Range Status   MRSA by PCR NEGATIVE  NEGATIVE  Final   Comment:            The GeneXpert MRSA Assay (FDA     approved for NASAL specimens     only), is one component of a     comprehensive MRSA colonization     surveillance program. It is not     intended to diagnose MRSA     infection nor to guide or     monitor treatment for     MRSA infections.   Medical History: Past Medical History  Diagnosis Date  . Arteriosclerotic cardiovascular disease (ASCVD)     Remote PTCA by patient report; LBBB; associated cardiomyopathy, presumed ischemic with EF 40-45% previously, 20% in 06/2009; h/o clinical congestive heart failure; negative stress nuclear in 2009 with inferoseptal and apical scar  . Anemia     Hgb of 9-10  . Hyperlipidemia   . Breast cancer   . Hypertension   . Dysrhythmia     LBBB  . Automatic implantable cardioverter-defibrillator in situ   . Pneumonia     "twice" (07/03/2013)  . Arthritis     "arms" (07/03/2013)  . HOH (hard of hearing)    Medications:  Scheduled:  .  allopurinol  100 mg Oral Daily  . aspirin  81 mg Oral Daily  . colchicine  0.6 mg Oral Daily  . guaiFENesin  600 mg Oral BID  . heparin  5,000 Units Subcutaneous 3 times per day  . oseltamivir  75 mg Oral BID  . piperacillin-tazobactam (ZOSYN)  IV  3.375 g Intravenous Q8H  . saccharomyces boulardii  250 mg Oral BID  . sodium chloride  3 mL Intravenous Q12H  . vancomycin  750 mg Intravenous Q12H   Assessment: 73yo female admitted with suspected sepsis.  Initial doses of Vancomycin 1000mg  and Zosyn 3.375gm ordered to be given in ED.  Normalized ClCr ~ 60-24ml/min  Goal of Therapy:  Vancomycin trough level 15-20 mcg/ml  Plan:   Vancomycin 750mg  IV q12hrs  Zosyn 3.375gm IV q8h, each dose over 4 hrs  Check Vancomycin trough at steady state  Monitor labs, renal fxn, and cultures  Hart Robinsons A 07/18/2013,7:55 AM

## 2013-07-18 NOTE — Progress Notes (Signed)
Dr Karie Kirks updated on pt HR that drops to 30's with pacer in place

## 2013-07-18 NOTE — Progress Notes (Signed)
[facility] here to transport to [facility] to cardiac ICU. Pt family updated. Pt hr 60 paced bp=117/61

## 2013-07-18 NOTE — Progress Notes (Signed)
Subjective: She says she feels better. However she is having bradycardia to the 30 's. This is asymptomatic and her pacer picks up but at a very low rate  Objective: Vital signs in last 24 hours: Temp:  [97.5 F (36.4 C)-101.9 F (38.8 C)] 97.5 F (36.4 C) (03/28 0736) Pulse Rate:  [50-89] 50 (03/28 0700) Resp:  [15-29] 17 (03/28 0700) BP: (90-131)/(29-74) 126/52 mmHg (03/28 0700) SpO2:  [91 %-98 %] 97 % (03/28 0700) Weight:  [64.4 kg (141 lb 15.6 oz)] 64.4 kg (141 lb 15.6 oz) (03/28 0700) Weight change:     Intake/Output from previous day: 03/27 0701 - 03/28 0700 In: 851.3 [I.V.:651.3; IV Piggyback:200] Out: 350 [Urine:350]  PHYSICAL EXAM General appearance: alert, mild distress and very hard of hearing Resp: rhonchi left more than right. Cardio: somewhat irregular and  generally slow GI: soft, non-tender; bowel sounds normal; no masses,  no organomegaly Extremities: extremities normal, atraumatic, no cyanosis or edema  Lab Results:    Basic Metabolic Panel:  Recent Labs  07/17/13 1856 07/18/13 0443  NA 142 140  K 4.6 4.0  CL 97 101  CO2 32 29  GLUCOSE 95 83  BUN 34* 23  CREATININE 1.21* 0.88  CALCIUM 8.5 7.8*   Liver Function Tests:  Recent Labs  07/17/13 1856  AST 21  ALT 24  ALKPHOS 92  BILITOT 0.4  PROT 7.4  ALBUMIN 2.5*   No results found for this basename: LIPASE, AMYLASE,  in the last 72 hours No results found for this basename: AMMONIA,  in the last 72 hours CBC:  Recent Labs  07/17/13 1856 07/18/13 0443  WBC 16.3* 23.7*  NEUTROABS 14.0*  --   HGB 9.6* 8.1*  HCT 31.2* 26.5*  MCV 71.7* 72.2*  PLT 282 249   Cardiac Enzymes: No results found for this basename: CKTOTAL, CKMB, CKMBINDEX, TROPONINI,  in the last 72 hours BNP: No results found for this basename: PROBNP,  in the last 72 hours D-Dimer: No results found for this basename: DDIMER,  in the last 72 hours CBG: No results found for this basename: GLUCAP,  in the last 72  hours Hemoglobin A1C: No results found for this basename: HGBA1C,  in the last 72 hours Fasting Lipid Panel: No results found for this basename: CHOL, HDL, LDLCALC, TRIG, CHOLHDL, LDLDIRECT,  in the last 72 hours Thyroid Function Tests: No results found for this basename: TSH, T4TOTAL, FREET4, T3FREE, THYROIDAB,  in the last 72 hours Anemia Panel: No results found for this basename: VITAMINB12, FOLATE, FERRITIN, TIBC, IRON, RETICCTPCT,  in the last 72 hours Coagulation:  Recent Labs  07/17/13 1856  LABPROT 15.4*  INR 1.25   Urine Drug Screen: Drugs of Abuse  No results found for this basename: labopia, cocainscrnur, labbenz, amphetmu, thcu, labbarb    Alcohol Level: No results found for this basename: ETH,  in the last 72 hours Urinalysis:  Recent Labs  07/17/13 2240  COLORURINE YELLOW  LABSPEC 1.010  PHURINE 5.5  GLUCOSEU NEGATIVE  HGBUR LARGE*  BILIRUBINUR NEGATIVE  KETONESUR NEGATIVE  PROTEINUR NEGATIVE  UROBILINOGEN 0.2  NITRITE NEGATIVE  [place]. Labs:  ABGS No results found for this basename: PHART, PCO2, PO2ART, TCO2, HCO3,  in the last 72 hours CULTURES Recent Results (from the past 240 hour(s))  CULTURE, BLOOD (ROUTINE X 2)     Status: None   Collection Time    07/17/13  7:30 PM      Result Value Ref Range  Status   Specimen Description Blood BLOOD RIGHT WRIST   Final   Special Requests BOTTLES DRAWN AEROBIC ONLY 5CC   Final   Culture PENDING   Incomplete   Report Status PENDING   Incomplete  MRSA PCR SCREENING     Status: None   Collection Time    07/17/13 10:00 PM      Result Value Ref Range Status   MRSA by PCR NEGATIVE  NEGATIVE Final   Comment:            The GeneXpert MRSA Assay (FDA     approved for NASAL specimens     only), is one component of a     comprehensive MRSA colonization     surveillance program. It is not     intended to diagnose MRSA     infection nor to guide or     monitor treatment for     MRSA  infections.   Studies/Results: Dg Chest Port 1 View  07/17/2013   CLINICAL DATA:  Fever and cough.  EXAM: PORTABLE CHEST - 1 VIEW  COMPARISON:  DG CHEST 2 VIEW dated 07/03/2013  FINDINGS: Left cardiac ICD appears stable. There is mild enlargement of the heart which appears unchanged. Densities at the left lung base and the left hemidiaphragm is obscured. Slightly enlarged interstitial densities at the right lung base. Negative for a pneumothorax.  IMPRESSION: Left basilar densities are concerning for airspace disease and small pleural effusion. Findings could represent pneumonia.  There may be mild dependent edema.   Electronically Signed   By: Markus Daft M.D.   On: 07/17/2013 18:53    Medications:  Prior to Admission:  Prescriptions prior to admission  Medication Sig Dispense Refill  . amLODipine (NORVASC) 5 MG tablet Take 5 mg by mouth daily.      [name] aspirin 81 MG tablet Take 81 mg by mouth daily.      . carvedilol (COREG) 25 MG tablet Take 1 tablet (25 mg total) by mouth 2 (two) times daily with a meal.  60 tablet  5  . colchicine 0.6 MG tablet Take 0.6 mg by mouth 2 (two) times daily.      . furosemide (LASIX) 40 MG tablet Take 40 mg by mouth 2 (two) times daily.      [name] ibuprofen (ADVIL,MOTRIN) 200 MG tablet Take 1 tablet (200 mg total) by mouth every 6 (six) hours as needed for fever, headache, mild pain or moderate pain.      [name] lisinopril (PRINIVIL,ZESTRIL) 40 MG tablet Take 1 tablet (40 mg total) by mouth daily.  30 tablet  0  . omeprazole (PRILOSEC) 20 MG capsule Take 20 mg by mouth daily. For gas/heartburn      . potassium chloride (K-DUR) 10 MEQ tablet Take 1 tablet (10 mEq total) by mouth 2 (two) times daily.  60 tablet  0  . allopurinol (ZYLOPRIM) 100 MG tablet Take 100 mg by mouth daily.      [name] lovastatin (MEVACOR) 40 MG tablet Take 1 tablet by mouth at bedtime.       Scheduled: . allopurinol  100 mg Oral Daily  . aspirin  81 mg Oral Daily  . colchicine  0.6 mg Oral Daily  .  guaiFENesin  600 mg Oral BID  . heparin  5,000 Units Subcutaneous 3 times per day  . oseltamivir  75 mg Oral BID  . piperacillin-tazobactam (ZOSYN)  IV  3.375 g Intravenous Q8H  . saccharomyces boulardii  250  mg Oral BID  . sodium chloride  3 mL Intravenous Q12H  . vancomycin  750 mg Intravenous Q12H   Continuous: . sodium chloride 75 mL/hr at 07/18/13 0700   [id number], acetaminophen, albuterol, guaiFENesin-dextromethorphan, ondansetron (ZOFRAN) IV, ondansetron  Assesment: She was admitted with healthcare associated pneumonia. I think she is better from that. She appeared that she might been somewhat septic when she arrived her blood pressure is better. He was concerned that she might have influenza but her influenza testing is negative.  She has chronic ischemic heart disease with severe systolic heart failure with 20% ejection fraction. She has a biventricular ICD because of that. However she has been bradycardic into the 30s and although her pacemaker does pick up it is at  a very low rate. I discussed her situation with Dr. Meda Coffee cardiologist on call at [facility] who agrees to accept her in transfer. Principal Problem:   HCAP (healthcare-associated pneumonia) Active Problems:   Chronic systolic heart failure   Biventricular ICD (implantable cardioverter-defibrillator) in place   Sepsis   ARF (acute renal failure)    Plan: Continue IV antibiotics et Ronney Asters. Transfer her to higher level of care with cardiology coverage available    LOS: 1 day   Khloie Hamada L 07/18/2013, 8:15 AM

## 2013-07-19 DIAGNOSIS — I498 Other specified cardiac arrhythmias: Secondary | ICD-10-CM

## 2013-07-19 DIAGNOSIS — Z95810 Presence of automatic (implantable) cardiac defibrillator: Secondary | ICD-10-CM

## 2013-07-19 DIAGNOSIS — J189 Pneumonia, unspecified organism: Secondary | ICD-10-CM | POA: Diagnosis present

## 2013-07-19 DIAGNOSIS — M069 Rheumatoid arthritis, unspecified: Secondary | ICD-10-CM

## 2013-07-19 LAB — CBC WITH DIFFERENTIAL/PLATELET
BASOS ABS: 0 10*3/uL (ref 0.0–0.1)
Basophils Relative: 0 % (ref 0–1)
EOS ABS: 0 10*3/uL (ref 0.0–0.7)
Eosinophils Relative: 0 % (ref 0–5)
HCT: 26.5 % — ABNORMAL LOW (ref 36.0–46.0)
Hemoglobin: 8.4 g/dL — ABNORMAL LOW (ref 12.0–15.0)
Lymphocytes Relative: 13 % (ref 12–46)
Lymphs Abs: 1 10*3/uL (ref 0.7–4.0)
MCH: 22.6 pg — AB (ref 26.0–34.0)
MCHC: 31.7 g/dL (ref 30.0–36.0)
MCV: 71.2 fL — AB (ref 78.0–100.0)
Monocytes Absolute: 0.7 10*3/uL (ref 0.1–1.0)
Monocytes Relative: 10 % (ref 3–12)
Neutro Abs: 5.7 10*3/uL (ref 1.7–7.7)
Neutrophils Relative %: 77 % (ref 43–77)
PLATELETS: 211 10*3/uL (ref 150–400)
RBC: 3.72 MIL/uL — ABNORMAL LOW (ref 3.87–5.11)
RDW: 20.5 % — AB (ref 11.5–15.5)
WBC: 7.4 10*3/uL (ref 4.0–10.5)

## 2013-07-19 LAB — LEGIONELLA ANTIGEN, URINE: Legionella Antigen, Urine: NEGATIVE

## 2013-07-19 LAB — BASIC METABOLIC PANEL
BUN: 15 mg/dL (ref 6–23)
CALCIUM: 8 mg/dL — AB (ref 8.4–10.5)
CHLORIDE: 100 meq/L (ref 96–112)
CO2: 24 meq/L (ref 19–32)
Creatinine, Ser: 0.81 mg/dL (ref 0.50–1.10)
GFR calc Af Amer: 81 mL/min — ABNORMAL LOW (ref >90)
GFR calc non Af Amer: 70 mL/min — ABNORMAL LOW (ref >90)
Glucose, Bld: 70 mg/dL (ref 70–99)
Potassium: 3.8 mEq/L (ref 3.7–5.3)
Sodium: 135 mEq/L — ABNORMAL LOW (ref 137–147)

## 2013-07-19 LAB — CLOSTRIDIUM DIFFICILE BY PCR: Toxigenic C. Difficile by PCR: NEGATIVE

## 2013-07-19 MED ORDER — SODIUM CHLORIDE 0.9 % IV SOLN
INTRAVENOUS | Status: DC
  Administered 2013-07-19: 250 mL via INTRAVENOUS

## 2013-07-19 MED ORDER — LISINOPRIL 5 MG PO TABS
5.0000 mg | ORAL_TABLET | Freq: Every day | ORAL | Status: DC
  Administered 2013-07-19 – 2013-07-21 (×3): 5 mg via ORAL
  Filled 2013-07-19 (×3): qty 1

## 2013-07-19 NOTE — Progress Notes (Signed)
[name] 07/19/2013

## 2013-07-19 NOTE — H&P (Signed)
[name] is an 73 y.o. female.    Primary Cardiologist: Dr. Dr. Lattie Haw PCP:  [name], MD  Chief Complaint: Pt transferred from [facility] due to pacemaker malfunction   HPI: Pt admitted to [facility] 07/17/13 with cough and fatigue.  She has also had some diarrhea but c diff neg.  She is positive for influenza B.   At [facility] it was reported she had HR to 30, Ido not find these strips.   She has past medical history of chronic systolic congestive heart failure with EF of about 30-35%, left bundle branch block, biventricular ICD implantation, recently diagnosed arthritis, presumed to be gout, who was admitted to [facility] on March 13 with complaints of chest pain. She was evaluated by cardiology. She was ruled out and was subsequently discharged- her chest pain was more of prominence in her upper neck.   With pacmaker problems she was transferred to [facility].  Here no bradycardia.  We will have BiV ICD interrogated.   Past Medical History  Diagnosis Date  . Arteriosclerotic cardiovascular disease (ASCVD)     Remote PTCA by patient report; LBBB; associated cardiomyopathy, presumed ischemic with EF 40-45% previously, 20% in 06/2009; h/o clinical congestive heart failure; negative stress nuclear in 2009 with inferoseptal and apical scar  . Anemia     Hgb of 9-10  . Hyperlipidemia   . Breast cancer   . Hypertension   . Dysrhythmia     LBBB  . Automatic implantable cardioverter-defibrillator in situ   . Pneumonia     "twice" (07/03/2013)  . Arthritis     "arms" (07/03/2013)  . HOH (hard of hearing)     Past Surgical History  Procedure Laterality Date  . Total knee arthroplasty Right     Dr.Harrison  . Bi-ventricular implantable cardioverter defibrillator  (crt-d)  07/28/2012  . Cataract extraction w/ intraocular lens implant Left   . Tubal ligation    . Breast biopsy Bilateral   . Mastectomy Left 1998    No family history on file. Social History:   reports that she has quit smoking. Her smoking use included Cigarettes. She smoked 0.00 packs per day. Her smokeless tobacco use includes Chew. She reports that she does not drink alcohol or use illicit drugs.  Allergies: No Known Allergies  Medications Prior to Admission  Medication Sig Dispense Refill  . amLODipine (NORVASC) 5 MG tablet Take 5 mg by mouth daily.      [name] aspirin 81 MG tablet Take 81 mg by mouth daily.      . carvedilol (COREG) 25 MG tablet Take 1 tablet (25 mg total) by mouth 2 (two) times daily with a meal.  60 tablet  5  . colchicine 0.6 MG tablet Take 0.6 mg by mouth 2 (two) times daily.      . furosemide (LASIX) 40 MG tablet Take 40 mg by mouth 2 (two) times daily.      [name] ibuprofen (ADVIL,MOTRIN) 200 MG tablet Take 1 tablet (200 mg total) by mouth every 6 (six) hours as needed for fever, headache, mild pain or moderate pain.      [name] lisinopril (PRINIVIL,ZESTRIL) 40 MG tablet Take 1 tablet (40 mg total) by mouth daily.  30 tablet  0  . omeprazole (PRILOSEC) 20 MG capsule Take 20 mg by mouth daily. For gas/heartburn      . potassium chloride (K-DUR) 10 MEQ tablet Take 1 tablet (10 mEq total)  by mouth 2 (two) times daily.  60 tablet  0  . allopurinol (ZYLOPRIM) 100 MG tablet Take 100 mg by mouth daily.      [name] lovastatin (MEVACOR) 40 MG tablet Take 1 tablet by mouth at bedtime.        Results for orders placed during the hospital encounter of 07/17/13 (from the past 48 hour(s))  CULTURE, BLOOD (ROUTINE X 2)     Status: None   Collection Time    07/17/13  6:56 PM      Result Value Ref Range   Specimen Description BLOOD RIGHT FOREARM     Special Requests BOTTLES DRAWN AEROBIC AND ANAEROBIC 5CC     Culture NO GROWTH 2 DAYS     Report Status PENDING    CBC WITH DIFFERENTIAL     Status: Abnormal   Collection Time    07/17/13  6:56 PM      Result Value Ref Range   WBC 16.3 (*) 4.0 - 10.5 K/uL   RBC 4.35  3.87 - 5.11 MIL/uL   Hemoglobin 9.6 (*) 12.0 - 15.0 g/dL   HCT 31.2  (*) 36.0 - 46.0 %   MCV 71.7 (*) 78.0 - 100.0 fL   MCH 22.1 (*) 26.0 - 34.0 pg   MCHC 30.8  30.0 - 36.0 g/dL   RDW 20.6 (*) 11.5 - 15.5 %   Platelets 282  150 - 400 K/uL   Neutrophils Relative % 86 (*) 43 - 77 %   Neutro Abs 14.0 (*) 1.7 - 7.7 K/uL   Lymphocytes Relative 8 (*) 12 - 46 %   Lymphs Abs 1.4  0.7 - 4.0 K/uL   Monocytes Relative 6  3 - 12 %   Monocytes Absolute 0.9  0.1 - 1.0 K/uL   Eosinophils Relative 0  0 - 5 %   Eosinophils Absolute 0.0  0.0 - 0.7 K/uL   Basophils Relative 0  0 - 1 %   Basophils Absolute 0.0  0.0 - 0.1 K/uL  COMPREHENSIVE METABOLIC PANEL     Status: Abnormal   Collection Time    07/17/13  6:56 PM      Result Value Ref Range   Sodium 142  137 - 147 mEq/L   Potassium 4.6  3.7 - 5.3 mEq/L   Chloride 97  96 - 112 mEq/L   CO2 32  19 - 32 mEq/L   Glucose, Bld 95  70 - 99 mg/dL   BUN 34 (*) 6 - 23 mg/dL   Creatinine, Ser 1.21 (*) 0.50 - 1.10 mg/dL   Calcium 8.5  8.4 - 10.5 mg/dL   Total Protein 7.4  6.0 - 8.3 g/dL   Albumin 2.5 (*) 3.5 - 5.2 g/dL   AST 21  0 - 37 U/L   ALT 24  0 - 35 U/L   Alkaline Phosphatase 92  39 - 117 U/L   Total Bilirubin 0.4  0.3 - 1.2 mg/dL   GFR calc non Af Amer 43 (*) >90 mL/min   GFR calc Af Amer 50 (*) >90 mL/min   Comment: (NOTE)     The eGFR has been calculated using the CKD EPI equation.     This calculation has not been validated in all clinical situations.     eGFR's persistently <90 mL/min signify possible Chronic Kidney     Disease.  LACTIC ACID, PLASMA     Status: None   Collection Time    07/17/13  6:56 PM  Result Value Ref Range   Lactic Acid, Venous 2.2  0.5 - 2.2 mmol/L  PROTIME-INR     Status: Abnormal   Collection Time    07/17/13  6:56 PM      Result Value Ref Range   Prothrombin Time 15.4 (*) 11.6 - 15.2 seconds   INR 1.25  0.00 - 1.49  APTT     Status: None   Collection Time    07/17/13  6:56 PM      Result Value Ref Range   aPTT 25  24 - 37 seconds  HIV ANTIBODY (ROUTINE TESTING)      Status: None   Collection Time    07/17/13  6:56 PM      Result Value Ref Range   HIV NON REACTIVE  NON REACTIVE   Comment: (NOTE)     Effective July 27, 2013, [employer] will no longer offer the     current 3rd Generation HIV diagnostic screening assay, HIV Antibodies,     HIV-1/2 EIA, with reflexes. At that time, [employer] will     only offer HIV-1/2 Ag/Ab, 4th Gen, w/ Reflexes as recommended by the     CDC. This HIV diagnostic screening assay tests for antibodies to HIV-1     and HIV-2 as well as HIV p24 antigen and provides greater sensitivity     for the detection of recent infection. Any orders for the 3rd     Generation assay will automatically be referred to the 4th Generation     assay.     Performed at [place], BLOOD (ROUTINE X 2)     Status: None   Collection Time    07/17/13  7:30 PM      Result Value Ref Range   Specimen Description Blood BLOOD RIGHT WRIST     Special Requests BOTTLES DRAWN AEROBIC ONLY 5CC     Culture NO GROWTH 2 DAYS     Report Status PENDING    INFLUENZA PANEL BY PCR (TYPE A & B, H1N1)     Status: Abnormal   Collection Time    07/17/13  8:34 PM      Result Value Ref Range   Influenza A By PCR NEGATIVE  NEGATIVE   Influenza B By PCR POSITIVE (*) NEGATIVE   H1N1 flu by pcr NOT DETECTED  NOT DETECTED   Comment:            The Xpert Flu assay (FDA approved for     nasal aspirates or washes and     nasopharyngeal swab specimens), is     intended as an aid in the diagnosis of     influenza and should not be used as     a sole basis for treatment.  MRSA PCR SCREENING     Status: None   Collection Time    07/17/13 10:00 PM      Result Value Ref Range   MRSA by PCR NEGATIVE  NEGATIVE   Comment:            The GeneXpert MRSA Assay (FDA     approved for NASAL specimens     only), is one component of a     comprehensive MRSA colonization     surveillance program. It is not     intended to diagnose MRSA      infection nor to guide or     monitor treatment for     MRSA  infections.  URINALYSIS, ROUTINE W REFLEX MICROSCOPIC     Status: Abnormal   Collection Time    07/17/13 10:40 PM      Result Value Ref Range   Color, Urine YELLOW  YELLOW   APPearance CLOUDY (*) CLEAR   Specific Gravity, Urine 1.010  1.005 - 1.030   pH 5.5  5.0 - 8.0   Glucose, UA NEGATIVE  NEGATIVE mg/dL   Hgb urine dipstick LARGE (*) NEGATIVE   Bilirubin Urine NEGATIVE  NEGATIVE   Ketones, ur NEGATIVE  NEGATIVE mg/dL   Protein, ur NEGATIVE  NEGATIVE mg/dL   Urobilinogen, UA 0.2  0.0 - 1.0 mg/dL   Nitrite NEGATIVE  NEGATIVE   Leukocytes, UA MODERATE (*) NEGATIVE  LEGIONELLA ANTIGEN, URINE     Status: None   Collection Time    07/17/13 10:40 PM      Result Value Ref Range   Specimen Description URINE, CLEAN CATCH     Special Requests NONE     Legionella Antigen, Urine       Value: Negative for Legionella pneumophilia serogroup 1     Performed at [employer] [facility]   Report Status 07/19/2013 FINAL    STREP PNEUMONIAE URINARY ANTIGEN     Status: Abnormal   Collection Time    07/17/13 10:40 PM      Result Value Ref Range   Strep Pneumo Urinary Antigen POSITIVE (*) NEGATIVE   Comment: PERFORMED AT [facility]     Performed at [place] [facility]     Status: Abnormal   Collection Time    07/17/13 10:40 PM      Result Value Ref Range   Squamous Epithelial / LPF FEW (*) RARE   WBC, UA 3-6  <3 WBC/hpf   RBC / HPF 7-10  <3 RBC/hpf   Bacteria, UA FEW (*) RARE  BASIC METABOLIC PANEL     Status: Abnormal   Collection Time    07/18/13  4:43 AM      Result Value Ref Range   Sodium 140  137 - 147 mEq/L   Potassium 4.0  3.7 - 5.3 mEq/L   Chloride 101  96 - 112 mEq/L   CO2 29  19 - 32 mEq/L   Glucose, Bld 83  70 - 99 mg/dL   BUN 23  6 - 23 mg/dL   Creatinine, Ser 0.88  0.50 - 1.10 mg/dL   Calcium 7.8 (*) 8.4 - 10.5 mg/dL   GFR calc non Af Amer 63 (*) >90 mL/min   GFR calc Af  Amer 73 (*) >90 mL/min   Comment: (NOTE)     The eGFR has been calculated using the CKD EPI equation.     This calculation has not been validated in all clinical situations.     eGFR's persistently <90 mL/min signify possible Chronic Kidney     Disease.  CBC     Status: Abnormal   Collection Time    07/18/13  4:43 AM      Result Value Ref Range   WBC 23.7 (*) 4.0 - 10.5 K/uL   RBC 3.67 (*) 3.87 - 5.11 MIL/uL   Hemoglobin 8.1 (*) 12.0 - 15.0 g/dL   HCT 26.5 (*) 36.0 - 46.0 %   MCV 72.2 (*) 78.0 - 100.0 fL   MCH 22.1 (*) 26.0 - 34.0 pg   MCHC 30.6  30.0 - 36.0 g/dL   RDW 20.6 (*) 11.5 - 15.5 %  Platelets 249  150 - 400 K/uL  BASIC METABOLIC PANEL     Status: Abnormal   Collection Time    07/19/13  3:10 AM      Result Value Ref Range   Sodium 135 (*) 137 - 147 mEq/L   Potassium 3.8  3.7 - 5.3 mEq/L   Chloride 100  96 - 112 mEq/L   CO2 24  19 - 32 mEq/L   Glucose, Bld 70  70 - 99 mg/dL   BUN 15  6 - 23 mg/dL   Creatinine, Ser 0.81  0.50 - 1.10 mg/dL   Calcium 8.0 (*) 8.4 - 10.5 mg/dL   GFR calc non Af Amer 70 (*) >90 mL/min   GFR calc Af Amer 81 (*) >90 mL/min   Comment: (NOTE)     The eGFR has been calculated using the CKD EPI equation.     This calculation has not been validated in all clinical situations.     eGFR's persistently <90 mL/min signify possible Chronic Kidney     Disease.  CLOSTRIDIUM DIFFICILE BY PCR     Status: None   Collection Time    07/19/13 10:40 AM      Result Value Ref Range   C difficile by pcr NEGATIVE  NEGATIVE   Dg Chest Port 1 View  07/17/2013   CLINICAL DATA:  Fever and cough.  EXAM: PORTABLE CHEST - 1 VIEW  COMPARISON:  DG CHEST 2 VIEW dated 07/03/2013  FINDINGS: Left cardiac ICD appears stable. There is mild enlargement of the heart which appears unchanged. Densities at the left lung base and the left hemidiaphragm is obscured. Slightly enlarged interstitial densities at the right lung base. Negative for a pneumothorax.  IMPRESSION: Left  basilar densities are concerning for airspace disease and small pleural effusion. Findings could represent pneumonia.  There may be mild dependent edema.   Electronically Signed   By: Markus Daft M.D.   On: 07/17/2013 18:53    ROS: General:+ fever no weight changes Skin:no rashes or ulcers HEENT:no blurred vision, no congestion CV:see HPI PUL:see HPI GI:no diarrhea constipation or melena, no indigestion GU:no hematuria, no dysuria MS:no joint pain, no claudication Neuro:no syncope, no lightheadedness Endo:no diabetes, no thyroid disease   Blood pressure 140/44, pulse 80, temperature 99.6 F (37.6 C), temperature source Oral, resp. rate 26, height _0  (1.626 m), weight 141 lb 15.6 oz (64.4 kg), SpO2 98.00%. PE: General:Pleasant affect, NAD Skin:Warm and dry, brisk capillary refill HEENT:normocephalic, sclera clear, mucus membranes moist Neck:supple, 1cm JVD, no bruits  Heart:S1S2 RRR without murmur, gallup, rub or click Lungs:diminshed breath sounds without rales, rhonchi, or wheezes KKX:FGHW, non tender, + BS, do not palpate liver spleen or masses Ext:no lower ext edema, 2+ pedal pulses, 2+ radial pulses Neuro:alert and oriented, MAE, follows commands, + facial symmetry    Assessment/Plan Principal Problem:   HCAP (healthcare-associated pneumonia) Active Problems:   Chronic systolic heart failure   Biventricular ICD (implantable cardioverter-defibrillator) in place   Sepsis   ARF (acute renal failure)  PLAN:  Will interrogate pacemaker.  Will ask TR[facility] to see as well with PNA and Flu and UTI.   Pt on antibiotics. No overt CHF.  She is not on her cardiac meds, will resume ace, hold BB until ppm interrogated.  No need for diuretic now.    [place] Nurse Practitioner Certified [place] Pager [phone] or after 5pm or weekends call [phone] 07/19/2013, 5:05 PM  Agree with the  above, In summary, [name] is a 73 year old female with h/o ischemic  cardiomyopathy, LVEF 30-35%, s/p BiV ICD placement ([employer]) a year ago. She was admitted to [facility] for CAP - influenza B and was found to be bradycardiac for wich she was transferred to [facility].  While in [facility] her HR is in 50', BiV ICD interrogation showed that the pacer's low rate is set at 50 BPM. There were no attached ECGs or strips showing bradycardia. If telemetry normal overnight, she can be discharged tomorrow.   Dorothy Spark 07/19/2013

## 2013-07-19 NOTE — Consult Note (Signed)
[facility]SULT[facility]       PATIENT DETAILS Name: [name] Age: 73 y.o. Sex: female Date of Birth: 02/18/1941 Admit Date: 07/17/2013 [id number], MD Requesting MD: Dorothy Spark, MD  Date of consultation: 07/19/13  REASON FOR CONSULTATION:  Management of medical issues  Assesment and Plan Principal Problem:   HCAP (healthcare-associated pneumonia) -Continue with Zosyn, will stop Vanco-Urine Strep Pneumo antigen positive.Clinically non toxic looking-though leukocytosis has worsened somewhat. Continue to follow clinical course and microbiology data-so far blood cultures continue to be negative.  Active Problems:   Influenza B -c/w Tamiflu.  Bradycardia -cardiology planning on interrogating the Pacemaker-defer to cardiology    Chronic systolic heart failure/  Biventricular ICD (implantable cardioverter-defibrillator) in place -per cardiology    Sepsis -secondary to PNA/Influenza -antibiotic and Tamiflu as above    ARF (acute renal failure) -resolved, likely pre-renal  Recently diagnosed gouty arthritis:  -This appears to be better. Continue with colchicine and allopurinol.  DVT Prophylaxis: -heparin  Code Status: -Full Code  [facility] will followup again tomorrow. Please contact me if I can be of assistance in the meanwhile. Thank you for this consultation.  [facility] Pager [phone]  HPI: [name] is a 73 y.o. female with a past medical history of chronic systolic congestive heart failure with EF of about 30-35%, left bundle branch block, biventricular ICD implantation, recently diagnosed arthritis, presumed to be gout, who was admitted to [facility] on March 13 with complaints of chest pain. She was evaluated by cardiology. She was ruled out and was subsequently discharged. Patient is a very poor historian probably due to cognitive impairment. She is extremely hard of hearing on top of that  and so history was extremely limited. Apparently, she was brought in due to fatigue for the last 3 days prior to admission. Apparently, she's been coughing as well and has reported some expectoration, although she was unable to tell me the same. She's had a couple episodes of vomiting. She was [facility] to [facility] and transferred to the Cardiology service because of Bradycardia. Hospitalist service was asked to consult this patient to manage her medical issues  ALLERGIES:  No Known Allergies  PAST MEDICAL HISTORY: Past Medical History  Diagnosis Date  . Arteriosclerotic cardiovascular disease (ASCVD)     Remote PTCA by patient report; LBBB; associated cardiomyopathy, presumed ischemic with EF 40-45% previously, 20% in 06/2009; h/o clinical congestive heart failure; negative stress nuclear in 2009 with inferoseptal and apical scar  . Anemia     Hgb of 9-10  . Hyperlipidemia   . Breast cancer   . Hypertension   . Dysrhythmia     LBBB  . Automatic implantable cardioverter-defibrillator in situ   . Pneumonia     "twice" (07/03/2013)  . Arthritis     "arms" (07/03/2013)  . HOH (hard of hearing)     PAST SURGICAL HISTORY: Past Surgical History  Procedure Laterality Date  . Total knee arthroplasty Right     Dr.Harrison  . Bi-ventricular implantable cardioverter defibrillator  (crt-d)  07/28/2012  . Cataract extraction w/ intraocular lens implant Left   . Tubal ligation    . Breast biopsy Bilateral   . Mastectomy Left 1998    MEDICATIONS AT HOME: Prior to Admission medications   Medication Sig Start Date End Date Taking? Authorizing Provider  amLODipine (NORVASC) 5 MG tablet Take 5 mg by mouth daily.   Yes Historical Provider, MD  aspirin 81 MG tablet Take  81 mg by mouth daily.   Yes Historical Provider, MD  carvedilol (COREG) 25 MG tablet Take 1 tablet (25 mg total) by mouth 2 (two) times daily with a meal. 05/20/12  Yes Lendon Colonel, NP  colchicine 0.6 MG tablet Take 0.6 mg  by mouth 2 (two) times daily.   Yes Historical Provider, MD  furosemide (LASIX) 40 MG tablet Take 40 mg by mouth 2 (two) times daily.   Yes Historical Provider, MD  ibuprofen (ADVIL,MOTRIN) 200 MG tablet Take 1 tablet (200 mg total) by mouth every 6 (six) hours as needed for fever, headache, mild pain or moderate pain. 07/06/13  Yes Modena Jansky, MD  lisinopril (PRINIVIL,ZESTRIL) 40 MG tablet Take 1 tablet (40 mg total) by mouth daily. 06/12/13  Yes Evans Lance, MD  omeprazole (PRILOSEC) 20 MG capsule Take 20 mg by mouth daily. For gas/heartburn   Yes Historical Provider, MD  potassium chloride (K-DUR) 10 MEQ tablet Take 1 tablet (10 mEq total) by mouth 2 (two) times daily. 07/06/13  Yes Modena Jansky, MD  allopurinol (ZYLOPRIM) 100 MG tablet Take 100 mg by mouth daily.    Historical Provider, MD  lovastatin (MEVACOR) 40 MG tablet Take 1 tablet by mouth at bedtime. 10/21/12   Historical Provider, MD    FAMILY HISTORY: No family history on file.  SOCIAL HISTORY:  reports that she has quit smoking. Her smoking use included Cigarettes. She smoked 0.00 packs per day. Her smokeless tobacco use includes Chew. She reports that she does not drink alcohol or use illicit drugs.  REVIEW OF SYSTEMS:  Constitutional:   No  weight loss, night sweats,  Fevers, chills, fatigue.  HEENT:    No headaches, Difficulty swallowing,Tooth/dental problems,Sore throat,  No sneezing, itching, ear ache, nasal congestion, post nasal drip,   Cardio-vascular: No chest pain,  Orthopnea, PND, swelling in lower extremities, anasarca,         dizziness, palpitations  GI:  No heartburn, indigestion, abdominal pain, nausea, vomiting, diarrhea, change in       bowel habits, loss of appetite  Resp: No shortness of breath with exertion or at rest.  No excess mucus, no productive cough, No non-productive cough,  No coughing up of blood.No change in color of mucus.No wheezing.No chest wall deformity  Skin:  no rash or  lesions.  GU:  no dysuria, change in color of urine, no urgency or frequency.  No flank pain.  Musculoskeletal: No joint pain or swelling.  No decreased range of motion.  No back pain.  Psych: No change in mood or affect. No depression or anxiety.  No memory loss.   PHYSICAL EXAM: Blood pressure 128/46, pulse 80, temperature 101.8 F (38.8 C), temperature source Oral, resp. rate 24, height 5\' 4"  (1.626 m), weight 64.4 kg (141 lb 15.6 oz), SpO2 99.00%.  General appearance :Awake, alert, not in any distress. Speech Clear. Not toxic Looking HEENT: Atraumatic and Normocephalic, pupils equally reactive to light and accomodation Neck: supple, no JVD. No cervical lymphadenopathy.  Chest:Good air entry bilaterally, no added sounds  CVS: S1 S2 regular, no murmurs.  Abdomen: Bowel sounds present, Non tender and not distended with no gaurding, rigidity or rebound. Extremities: B/L Lower Ext shows no edema, both legs are warm to touch, with  dorsalis pedis pulses palpable. Neurology: Awake alert, and oriented X 3, CN II-XII intact, Non focal, Deep Tendon Reflex-2+ all over, plantar's downgoing B/L, sensory exam is grossly intact.  Skin:No Rash Wounds:N/A  LABS  ON ADMISSION:   Recent Labs  07/18/13 0443 07/19/13 0310  NA 140 135*  K 4.0 3.8  CL 101 100  CO2 29 24  GLUCOSE 83 70  BUN 23 15  CREATININE 0.88 0.81  CALCIUM 7.8* 8.0*    Recent Labs  07/17/13 1856  AST 21  ALT 24  ALKPHOS 92  BILITOT 0.4  PROT 7.4  ALBUMIN 2.5*   No results found for this basename: LIPASE, AMYLASE,  in the last 72 hours  Recent Labs  07/17/13 1856 07/18/13 0443  WBC 16.3* 23.7*  NEUTROABS 14.0*  --   HGB 9.6* 8.1*  HCT 31.2* 26.5*  MCV 71.7* 72.2*  PLT 282 249   No results found for this basename: CKTOTAL, CKMB, CKMBINDEX, TROPONINI,  in the last 72 hours No results found for this basename: DDIMER,  in the last 72 hours No components found with this basename: POCBNP,    RADIOLOGIC  STUDIES ON ADMISSION: Dg Chest 2 View  07/03/2013   CLINICAL DATA:  Cough and congestion  EXAM: CHEST  2 VIEW  COMPARISON:  07/29/2012  FINDINGS: Cardiac shadow is within normal limits. A defibrillator is again noted. Mild interstitial changes are seen without focal confluent infiltrate. Postsurgical changes are noted in the left axilla.  IMPRESSION: No acute abnormality noted.   Electronically Signed   By: Inez Catalina M.D.   On: 07/03/2013 13:14   Dg Wrist 2 Views Left  07/03/2013   CLINICAL DATA:  Left wrist pain and swelling.  EXAM: LEFT WRIST - 2 VIEW  COMPARISON:  None.  FINDINGS: Diffuse soft tissue swelling.  Unremarkable bones.  IMPRESSION: Diffuse soft tissue swelling without underlying bony abnormality.   Electronically Signed   By: Enrique Sack M.D.   On: 07/03/2013 20:03   Dg Wrist 2 Views Right  07/03/2013   CLINICAL DATA:  Bilateral wrist pain and swelling.  EXAM: RIGHT WRIST - 2 VIEW  COMPARISON:  None.  FINDINGS: Dorsal soft tissue swelling.  Unremarkable bones.  IMPRESSION: Dorsal soft tissue swelling without underlying bony abnormality.   Electronically Signed   By: Enrique Sack M.D.   On: 07/03/2013 20:02   Dg Chest Port 1 View  07/17/2013   CLINICAL DATA:  Fever and cough.  EXAM: PORTABLE CHEST - 1 VIEW  COMPARISON:  DG CHEST 2 VIEW dated 07/03/2013  FINDINGS: Left cardiac ICD appears stable. There is mild enlargement of the heart which appears unchanged. Densities at the left lung base and the left hemidiaphragm is obscured. Slightly enlarged interstitial densities at the right lung base. Negative for a pneumothorax.  IMPRESSION: Left basilar densities are concerning for airspace disease and small pleural effusion. Findings could represent pneumonia.  There may be mild dependent edema.   Electronically Signed   By: Markus Daft M.D.   On: 07/17/2013 18:53     Total time spent 45 minutes.  [place] [facility] Pager [phone]  If 7PM-7AM, please contact  night-coverage [email or web address] Password [facility] 07/19/2013, 6:24 PM

## 2013-07-19 NOTE — Progress Notes (Signed)
Pacemaker interrogated, no shocks, no abnormalities.  + PVCs.  Her low rate is set on 50.  No strips in computer to show HR to 30.

## 2013-07-20 DIAGNOSIS — J111 Influenza due to unidentified influenza virus with other respiratory manifestations: Secondary | ICD-10-CM

## 2013-07-20 DIAGNOSIS — Z95810 Presence of automatic (implantable) cardiac defibrillator: Secondary | ICD-10-CM

## 2013-07-20 LAB — BASIC METABOLIC PANEL
BUN: 12 mg/dL (ref 6–23)
CALCIUM: 8.1 mg/dL — AB (ref 8.4–10.5)
CO2: 17 meq/L — AB (ref 19–32)
Chloride: 101 mEq/L (ref 96–112)
Creatinine, Ser: 0.47 mg/dL — ABNORMAL LOW (ref 0.50–1.10)
GFR calc Af Amer: >90 mL/min (ref >90)
GFR calc non Af Amer: >90 mL/min (ref >90)
GLUCOSE: 70 mg/dL (ref 70–99)
Potassium: 4.9 mEq/L (ref 3.7–5.3)
Sodium: 132 mEq/L — ABNORMAL LOW (ref 137–147)

## 2013-07-20 LAB — PRO B NATRIURETIC PEPTIDE: PRO B NATRI PEPTIDE: 2593 pg/mL — AB (ref 0–125)

## 2013-07-20 MED ORDER — ENSURE COMPLETE PO LIQD
237.0000 mL | ORAL | Status: DC
  Administered 2013-07-20: 237 mL via ORAL

## 2013-07-20 NOTE — Progress Notes (Signed)
PATIENT DETAILS Name: [name] Age: 73 y.o. Sex: female Date of Birth: July 28, 1940 Admit Date: 07/17/2013 Admitting Physician Bonnielee Haff, MD [id number], MD  Subjective: Breathing better, cough significantly decreased. Very hard of hearing.  Assessment/Plan:   HCAP (healthcare-associated pneumonia) - Presumed Streptococcus pneumoniae pneumonia given urine antigen positive. - Currently on day 4 of Zosyn, vancomycin discontinued on 3/30 after 3 days of treatment. Blood cultures on 3/27 negative. Remains significantly improved, although continues to have intermittent fever. Would continue with Zosyn and transition to Levaquin to complete a total of 7 days of treatment.  Influenza - As noted above, significantly improved, with intermittent fever. - Continue with Tamiflu, stop date on 07/22/13.  Sepsis - Likely secondary to above, resolved. - Antimicrobial therapy as indicated above. Blood cultures negative, C. difficile PCR negative.  Acute renal failure -resolved, likely pre-renal  Recently diagnosed gouty arthritis:  -This appears to be better. Continue with colchicine and allopurinol.   Bradycardia - Cardiology's note, pacemaker was interrogated on 3/29, no significant abnormalities seen  Chronic systolic heart failure/ Biventricular ICD (implantable cardioverter-defibrillator) in place  -per cardiology  Disposition: Remain inpatient-get PT-ok to move out of SDU from Hospitalist point of view-?home in am  DVT Prophylaxis: Prophylactic Heparin  Code Status: Full code   Family Communication None at bedside  Procedures:  None   MEDICATIONS: Scheduled Meds: . allopurinol  100 mg Oral Daily  . aspirin  81 mg Oral Daily  . colchicine  0.6 mg Oral Daily  . guaiFENesin  600 mg Oral BID  . heparin  5,000 Units Subcutaneous 3 times per day  . lisinopril  5 mg Oral Daily  . oseltamivir  75 mg Oral BID  . piperacillin-tazobactam (ZOSYN)  IV  3.375  g Intravenous Q8H  . saccharomyces boulardii  250 mg Oral BID  . sodium chloride  3 mL Intravenous Q12H   Continuous Infusions: . sodium chloride 250 mL (07/19/13 2351)   PRN Meds:.acetaminophen, acetaminophen, albuterol, guaiFENesin-dextromethorphan, ondansetron (ZOFRAN) IV, ondansetron  Antibiotics: Anti-infectives   Start     Dose/Rate Route Frequency Ordered Stop   07/18/13 0600  vancomycin (VANCOCIN) IVPB 750 mg/150 ml premix  Status:  Discontinued     750 mg 150 mL/hr over 60 Minutes Intravenous Every 12 hours 07/17/13 1859 07/19/13 1837   07/18/13 0200  piperacillin-tazobactam (ZOSYN) IVPB 3.375 g     3.375 g 12.5 mL/hr over 240 Minutes Intravenous Every 8 hours 07/17/13 1901     07/17/13 2215  oseltamivir (TAMIFLU) capsule 75 mg     75 mg Oral 2 times daily 07/17/13 2211 07/22/13 2159   07/17/13 1830  piperacillin-tazobactam (ZOSYN) IVPB 3.375 g     3.375 g 100 mL/hr over 30 Minutes Intravenous  Once 07/17/13 1826 07/17/13 2006   07/17/13 1830  vancomycin (VANCOCIN) IVPB 1000 mg/200 mL premix     1,000 mg 200 mL/hr over 60 Minutes Intravenous  Once 07/17/13 1826 07/17/13 2103       PHYSICAL EXAM: Vital signs in last 24 hours: Filed Vitals:   07/19/13 2200 07/20/13 0000 07/20/13 0400 07/20/13 0805  BP: 130/45 130/45 144/55 162/52  Pulse: 55 63 69   Temp:  97.4 F (36.3 C) 98.8 F (37.1 C) 100 F (37.8 C)  TempSrc:  Oral Oral Oral  Resp:      Height:      Weight:      SpO2: 98% 98% 100% 99%    Weight change:  Filed Weights   07/17/13 2150 07/18/13 0700  Weight: 64.4 kg (141 lb 15.6 oz) 64.4 kg (141 lb 15.6 oz)   Body mass index is 24.36 kg/(m^2).   Gen Exam: Awake and alert with clear speech. Very hard of hearing. Neck: Supple, No JVD.   Chest:  Few bibasilar rales CVS: S1 S2 Regular, no murmurs.  Abdomen: soft, BS +, non tender, non distended.  Extremities: no edema, lower extremities warm to touch. Neurologic: Non Focal.   Skin: No Rash.   Wounds:  N/A.   Intake/Output from previous day:  Intake/Output Summary (Last 24 hours) at 07/20/13 1139 Last data filed at 07/20/13 0600  Gross per 24 hour  Intake 529.83 ml  Output      0 ml  Net 529.83 ml     LAB RESULTS: CBC  Recent Labs Lab 07/17/13 1856 07/18/13 0443 07/19/13 2011  WBC 16.3* 23.7* 7.4  HGB 9.6* 8.1* 8.4*  HCT 31.2* 26.5* 26.5*  PLT 282 249 211  MCV 71.7* 72.2* 71.2*  MCH 22.1* 22.1* 22.6*  MCHC 30.8 30.6 31.7  RDW 20.6* 20.6* 20.5*  LYMPHSABS 1.4  --  1.0  MONOABS 0.9  --  0.7  EOSABS 0.0  --  0.0  BASOSABS 0.0  --  0.0    Chemistries   Recent Labs Lab 07/17/13 1856 07/18/13 0443 07/19/13 0310 07/20/13 0300  NA 142 140 135* 132*  K 4.6 4.0 3.8 4.9  CL 97 101 100 101  CO2 32 29 24 17*  GLUCOSE 95 83 70 70  BUN 34* 23 15 12   CREATININE 1.21* 0.88 0.81 0.47*  CALCIUM 8.5 7.8* 8.0* 8.1*    CBG: No results found for this basename: GLUCAP,  in the last 168 hours  GFR Estimated Creatinine Clearance: 53.3 ml/min (by C-G formula based on Cr of 0.47).  Coagulation profile  Recent Labs Lab 07/17/13 1856  INR 1.25    Cardiac Enzymes No results found for this basename: CK, CKMB, TROPONINI, MYOGLOBIN,  in the last 168 hours  No components found with this basename: POCBNP,  No results found for this basename: DDIMER,  in the last 72 hours No results found for this basename: HGBA1C,  in the last 72 hours No results found for this basename: CHOL, HDL, LDLCALC, TRIG, CHOLHDL, LDLDIRECT,  in the last 72 hours No results found for this basename: TSH, T4TOTAL, FREET3, T3FREE, THYROIDAB,  in the last 72 hours No results found for this basename: VITAMINB12, FOLATE, FERRITIN, TIBC, IRON, RETICCTPCT,  in the last 72 hours No results found for this basename: LIPASE, AMYLASE,  in the last 72 hours  Urine Studies No results found for this basename: UACOL, UAPR, USPG, UPH, UTP, UGL, UKET, UBIL, UHGB, UNIT, UROB, ULEU, UEPI, UWBC, URBC, UBAC, CAST, CRYS,  UCOM, BILUA,  in the last 72 hours  MICROBIOLOGY: Recent Results (from the past 240 hour(s))  CULTURE, BLOOD (ROUTINE X 2)     Status: None   Collection Time    07/17/13  6:56 PM      Result Value Ref Range Status   Specimen Description BLOOD RIGHT FOREARM   Final   Special Requests BOTTLES DRAWN AEROBIC AND ANAEROBIC 5CC   Final   Culture NO GROWTH 3 DAYS   Final   Report Status PENDING   Incomplete  CULTURE, BLOOD (ROUTINE X 2)     Status: None   Collection Time    07/17/13  7:30 PM      Result  Value Ref Range Status   Specimen Description Blood BLOOD RIGHT WRIST   Final   Special Requests BOTTLES DRAWN AEROBIC ONLY 5CC   Final   Culture NO GROWTH 3 DAYS   Final   Report Status PENDING   Incomplete  MRSA PCR SCREENING     Status: None   Collection Time    07/17/13 10:00 PM      Result Value Ref Range Status   MRSA by PCR NEGATIVE  NEGATIVE Final   Comment:            The GeneXpert MRSA Assay (FDA     approved for NASAL specimens     only), is one component of a     comprehensive MRSA colonization     surveillance program. It is not     intended to diagnose MRSA     infection nor to guide or     monitor treatment for     MRSA infections.  CLOSTRIDIUM DIFFICILE BY PCR     Status: None   Collection Time    07/19/13 10:40 AM      Result Value Ref Range Status   C difficile by pcr NEGATIVE  NEGATIVE Final    RADIOLOGY STUDIES/RESULTS: Dg Chest 2 View  07/03/2013   CLINICAL DATA:  Cough and congestion  EXAM: CHEST  2 VIEW  COMPARISON:  07/29/2012  FINDINGS: Cardiac shadow is within normal limits. A defibrillator is again noted. Mild interstitial changes are seen without focal confluent infiltrate. Postsurgical changes are noted in the left axilla.  IMPRESSION: No acute abnormality noted.   Electronically Signed   By: Inez Catalina M.D.   On: 07/03/2013 13:14   Dg Wrist 2 Views Left  07/03/2013   CLINICAL DATA:  Left wrist pain and swelling.  EXAM: LEFT WRIST - 2 VIEW   COMPARISON:  None.  FINDINGS: Diffuse soft tissue swelling.  Unremarkable bones.  IMPRESSION: Diffuse soft tissue swelling without underlying bony abnormality.   Electronically Signed   By: Enrique Sack M.D.   On: 07/03/2013 20:03   Dg Wrist 2 Views Right  07/03/2013   CLINICAL DATA:  Bilateral wrist pain and swelling.  EXAM: RIGHT WRIST - 2 VIEW  COMPARISON:  None.  FINDINGS: Dorsal soft tissue swelling.  Unremarkable bones.  IMPRESSION: Dorsal soft tissue swelling without underlying bony abnormality.   Electronically Signed   By: Enrique Sack M.D.   On: 07/03/2013 20:02   Dg Chest Port 1 View  07/17/2013   CLINICAL DATA:  Fever and cough.  EXAM: PORTABLE CHEST - 1 VIEW  COMPARISON:  DG CHEST 2 VIEW dated 07/03/2013  FINDINGS: Left cardiac ICD appears stable. There is mild enlargement of the heart which appears unchanged. Densities at the left lung base and the left hemidiaphragm is obscured. Slightly enlarged interstitial densities at the right lung base. Negative for a pneumothorax.  IMPRESSION: Left basilar densities are concerning for airspace disease and small pleural effusion. Findings could represent pneumonia.  There may be mild dependent edema.   Electronically Signed   By: Markus Daft M.D.   On: 07/17/2013 18:53    Oren Binet, MD  [facility] Pager:[phone]  If 7PM-7AM, please contact night-coverage [email or web address] Password [email or web address] 07/20/2013, 11:39 AM   LOS: 3 days

## 2013-07-20 NOTE — Progress Notes (Addendum)
SUBJECTIVE:  Feels ok.   OBJECTIVE:   Vitals:   Filed Vitals:   07/20/13 0000 07/20/13 0400 07/20/13 0805 07/20/13 1158  BP: 130/45 144/55 162/52 130/56  Pulse: 63 69    Temp: 97.4 F (36.3 C) 98.8 F (37.1 C) 100 F (37.8 C) 100.4 F (38 C)  TempSrc: Oral Oral Oral Oral  Resp:      Height:      Weight:      SpO2: 98% 100% 99% 95%   I&O's:   Intake/Output Summary (Last 24 hours) at 07/20/13 1253 Last data filed at 07/20/13 0600  Gross per 24 hour  Intake 279.83 ml  Output      0 ml  Net 279.83 ml   TELEMETRY: Reviewed telemetry pt in NSR:     PHYSICAL EXAM General: Well developed, in no acute distress Head:    Normal cephalic and atramatic  Lungs:   Clear bilaterally to auscultation and percussion. Heart:  HRRR S1 S2  No JVD.   Abdomen:  abdomen soft and non-tender  Msk:  Back normal,.Normal strength and tone for age. Extremities:  no edema.  DP +1 Neuro: Alert and oriented X 3. Psych:  Good affect, responds appropriately   LABS: Basic Metabolic Panel:  Recent Labs  07/19/13 0310 07/20/13 0300  NA 135* 132*  K 3.8 4.9  CL 100 101  CO2 24 17*  GLUCOSE 70 70  BUN 15 12  CREATININE 0.81 0.47*  CALCIUM 8.0* 8.1*   Liver Function Tests:  Recent Labs  07/17/13 1856  AST 21  ALT 24  ALKPHOS 92  BILITOT 0.4  PROT 7.4  ALBUMIN 2.5*   No results found for this basename: LIPASE, AMYLASE,  in the last 72 hours CBC:  Recent Labs  07/17/13 1856 07/18/13 0443 07/19/13 2011  WBC 16.3* 23.7* 7.4  NEUTROABS 14.0*  --  5.7  HGB 9.6* 8.1* 8.4*  HCT 31.2* 26.5* 26.5*  MCV 71.7* 72.2* 71.2*  PLT 282 249 211   Cardiac Enzymes: No results found for this basename: CKTOTAL, CKMB, CKMBINDEX, TROPONINI,  in the last 72 hours BNP: No components found with this basename: POCBNP,  D-Dimer: No results found for this basename: DDIMER,  in the last 72 hours Hemoglobin A1C: No results found for this basename: HGBA1C,  in the last 72 hours Fasting Lipid  Panel: No results found for this basename: CHOL, HDL, LDLCALC, TRIG, CHOLHDL, LDLDIRECT,  in the last 72 hours Thyroid Function Tests: No results found for this basename: TSH, T4TOTAL, FREET3, T3FREE, THYROIDAB,  in the last 72 hours Anemia Panel: No results found for this basename: VITAMINB12, FOLATE, FERRITIN, TIBC, IRON, RETICCTPCT,  in the last 72 hours Coag Panel:   Lab Results  Component Value Date   INR 1.25 07/17/2013   INR 1.04 07/14/2012    RADIOLOGY: Dg Chest 2 View  07/03/2013   CLINICAL DATA:  Cough and congestion  EXAM: CHEST  2 VIEW  COMPARISON:  07/29/2012  FINDINGS: Cardiac shadow is within normal limits. A defibrillator is again noted. Mild interstitial changes are seen without focal confluent infiltrate. Postsurgical changes are noted in the left axilla.  IMPRESSION: No acute abnormality noted.   Electronically Signed   By: Inez Catalina M.D.   On: 07/03/2013 13:14   Dg Wrist 2 Views Left  07/03/2013   CLINICAL DATA:  Left wrist pain and swelling.  EXAM: LEFT WRIST - 2 VIEW  COMPARISON:  None.  FINDINGS: Diffuse soft tissue swelling.  Unremarkable bones.  IMPRESSION: Diffuse soft tissue swelling without underlying bony abnormality.   Electronically Signed   By: Enrique Sack M.D.   On: 07/03/2013 20:03   Dg Wrist 2 Views Right  07/03/2013   CLINICAL DATA:  Bilateral wrist pain and swelling.  EXAM: RIGHT WRIST - 2 VIEW  COMPARISON:  None.  FINDINGS: Dorsal soft tissue swelling.  Unremarkable bones.  IMPRESSION: Dorsal soft tissue swelling without underlying bony abnormality.   Electronically Signed   By: Enrique Sack M.D.   On: 07/03/2013 20:02   Dg Chest Port 1 View  07/17/2013   CLINICAL DATA:  Fever and cough.  EXAM: PORTABLE CHEST - 1 VIEW  COMPARISON:  DG CHEST 2 VIEW dated 07/03/2013  FINDINGS: Left cardiac ICD appears stable. There is mild enlargement of the heart which appears unchanged. Densities at the left lung base and the left hemidiaphragm is obscured. Slightly  enlarged interstitial densities at the right lung base. Negative for a pneumothorax.  IMPRESSION: Left basilar densities are concerning for airspace disease and small pleural effusion. Findings could represent pneumonia.  There may be mild dependent edema.   Electronically Signed   By: Markus Daft M.D.   On: 07/17/2013 18:53      ASSESSMENT/PLAN:   Cardiomyopathy: EF 40-45%. No CHF at this time.  Pacer functioning well per interrogation.  Flu positive:Continue tamiflu.  ?PNA. Continue Zosyn for one more day.  Could give one dose of Levaquin 750 mg tomorrow before d/c to conver her for 7 day course, as directed by the IM team.  No sputum, urine antigen noted.  Jettie Booze., MD  07/20/2013  12:53 PM

## 2013-07-20 NOTE — Progress Notes (Signed)
ANTIBIOTIC CONSULT NOTE - Follow up  Pharmacy Consult for Zosyn Indication: suspected sepsis  No Known Allergies  Patient Measurements: Height: 5\' 4"  (162.6 cm) Weight: 141 lb 15.6 oz (64.4 kg) IBW/kg (Calculated) : 54.7 Last Recorded Weight = 63.5Kg  Vital Signs: Temp: 100 F (37.8 C) (03/30 0805) Temp src: Oral (03/30 0805) BP: 162/52 mmHg (03/30 0805) Pulse Rate: 69 (03/30 0400) Intake/Output from previous day: 03/29 0701 - 03/30 0700 In: 809.8 [P.O.:580; I.V.:79.8; IV Piggyback:150] Out: -  Intake/Output from this shift:    Labs:  Recent Labs  07/17/13 1856 07/18/13 0443 07/19/13 0310 07/19/13 2011 07/20/13 0300  WBC 16.3* 23.7*  --  7.4  --   HGB 9.6* 8.1*  --  8.4*  --   PLT 282 249  --  211  --   CREATININE 1.21* 0.88 0.81  --  0.47*   Estimated Creatinine Clearance: 53.3 ml/min (by C-G formula based on Cr of 0.47). No results found for this basename: VANCOTROUGH, VANCOPEAK, VANCORANDOM, GENTTROUGH, GENTPEAK, GENTRANDOM, TOBRATROUGH, TOBRAPEAK, TOBRARND, AMIKACINPEAK, AMIKACINTROU, AMIKACIN,  in the last 72 hours   Assessment: 73yo female admitted with suspected sepsis.  Currently on Tamiflu and Zosyn for Flu-positive and suspected HCAP. Vancomycin was stopped yesterday. WBC has trended down significantly to nl. Max temp 101.8. CrCl ~ 53 mL/min  3/27 Vanc>>3/29 3/27 Zosyn>> 3/27 Tamiflu>>  3/27 Flu>> pos B 3/27 Blood>> ngtd  3/29 C.diff>>neg    Goal of Therapy:  Eradication of infection  Plan:  -Zosyn 3.375gm IV q8h -Adjust maintenance doses if necessary -Monitor labs, renal fxn, and cultures   Albertina Parr, PharmD.  Clinical Pharmacist Pager [phone]

## 2013-07-20 NOTE — Evaluation (Signed)
Physical Therapy Evaluation Patient Details Name: [name] MRN: [id number] DOB: September 22, 1940 Today's Date: 07/20/2013   History of Present Illness  Pt admitted to [facility] 07/17/13 with cough and fatigue.  She has also had some diarrhea but c diff neg.  She is positive for influenza B.   At [facility] it was reported she had HR to 30, Ido not find these strips.   She has past medical history of chronic systolic congestive heart failure with EF of about 30-35%, left bundle branch block, biventricular ICD implantation, recently diagnosed arthritis, presumed to be gout, who was admitted to [facility] on March 13 with complaints of chest pain. She was evaluated by cardiology. She was ruled out and was subsequently discharged  Clinical Impression  Pt very pleasant but not back to her PLOF particularly with endurance with gait. Pt noted to have hip flexion weakness and difficult to communicate with due to [facility] and needing to use white board for writing most questions. Niece present end of session to confirm pt typically cooking and driving with only min assist from spouse. Pt will benefit from acute therapy to maximize activity and strength but discussed with family HEP and ambulation for continued motivation and assist from niece. Pt safe for DC home when medically ready.    Follow Up Recommendations No PT follow up    Equipment Recommendations  None recommended by PT    Recommendations for Other Services       Precautions / Restrictions Precautions Precautions: None      Mobility  Bed Mobility Overal bed mobility: Modified Independent                Transfers Overall transfer level: Modified independent   Transfers: Sit to/from Stand Sit to Stand: Modified independent (Device/Increase time)            Ambulation/Gait Ambulation/Gait assistance: Supervision Ambulation Distance (Feet): 250 Feet Assistive device: None Gait Pattern/deviations: Step-through  pattern;Decreased stride length     General Gait Details: cues for direction only  Stairs Stairs: Yes Stairs assistance: Modified independent (Device/Increase time) Stair Management: One rail Right;Alternating pattern;Forwards Number of Stairs: 3    Wheelchair Mobility    Modified Rankin (Stroke Patients Only)       Balance                                     Pertinent Vitals/Pain No pain HR 87-92 sats 91-96% on RA throughout with all activity    Home Living Family/patient expects to be discharged to:: Private residence Living Arrangements: Spouse/significant other Available Help at Discharge: Family Type of Home: Mobile home Home Access: Stairs to enter Entrance Stairs-Rails: None Entrance Stairs-Number of Steps: 4 Home Layout: One level Home Equipment: Environmental consultant - 2 wheels      Prior Function Level of Independence: Independent         Comments: pt reports spouse helps her take a bath and family will do anything she asks but typically she cooks and drives to the grocery store     [place]        Extremity/Trunk Assessment   Upper Extremity Assessment: Generalized weakness           Lower Extremity Assessment: Overall WFL for tasks assessed;RLE deficits/detail;LLE deficits/detail RLE Deficits / Details: 3/5 hip flexion, 4/5 knee flexion/extension LLE Deficits / Details: 3/5 hip flexion, 4/5 knee flexion/extension  Cervical /  Trunk Assessment: Normal  Communication   Communication: HOH  Cognition Arousal/Alertness: Awake/alert Behavior During Therapy: WFL for tasks assessed/performed Overall Cognitive Status: Within Functional Limits for tasks assessed                      General Comments      Exercises General Exercises - Lower Extremity Hip Flexion/Marching: AROM;Seated;Both;10 reps      Assessment/Plan    PT Assessment Patient needs continued PT services  PT Diagnosis Difficulty walking   PT Problem  List Decreased activity tolerance;Decreased mobility  PT Treatment Interventions Gait training;Functional mobility training;Therapeutic activities;Therapeutic exercise;Patient/family education   PT Goals (Current goals can be found in the Care Plan section) Acute Rehab PT Goals Patient Stated Goal: get back to cooking PT Goal Formulation: With patient/family Time For Goal Achievement: 07/27/13 Potential to Achieve Goals: Good    Frequency Min 3X/week   Barriers to discharge        End of Session   Activity Tolerance: Patient tolerated treatment well Patient left: in chair;with call bell/phone within reach;with family/visitor present         Time: [id number] PT Time Calculation (min): 35 min   Charges:   PT Evaluation $Initial PT Evaluation Tier I: 1 Procedure PT Treatments $Therapeutic Activity: 8-22 mins   PT G CodesMelford Aase 07/20/2013, 1:59 PM Elwyn Reach, [place]

## 2013-07-20 NOTE — Care Management Note (Signed)
    Page 1 of 1   07/20/2013     10:05:21 AM   CARE MANAGEMENT NOTE 07/20/2013  Patient:  [name], [name]   Account Number:  [id number]  Date Initiated:  07/20/2013  Documentation initiated by:  Elissa Hefty  Subjective/Objective Assessment:   adm w sepsis     Action/Plan:   lives w husband, pcp dr Brandon Melnick [place]   Anticipated DC Date:     Anticipated DC Plan:           Choice offered to / List presented to:             Status of service:   Medicare Important Message given?   (If response is "NO", the following Medicare IM given date fields will be blank) Date Medicare IM given:   Date Additional Medicare IM given:    Discharge Disposition:    Per UR Regulation:  Reviewed for med. necessity/level of care/duration of stay  If discussed at [place] of Stay Meetings, dates discussed:    Comments:

## 2013-07-20 NOTE — Progress Notes (Signed)
INITIAL NUTRITION ASSESSMENT  DOCUMENTATION CODES Per approved criteria  -Non-severe (moderate) malnutrition in the context of acute illness   INTERVENTION: 1.  General healthful diet; encourage intake of foods and beverages as able.  RD to follow and assess for nutritional adequacy.  2.  Supplements; Ensure Complete po daily, each supplement provides 350 kcal and 13 grams of protein  NUTRITION DIAGNOSIS: Increased nutrient needs related to healing as evidenced by infection.   Monitor:  1.  Food/Beverage; pt meeting >/=90% estimated needs with tolerance. 2.  Wt/wt change; monitor trends  Reason for Assessment: MST  73 y.o. female  Admitting Dx: HCAP (healthcare-associated pneumonia)  ASSESSMENT: Pt admitted with pneumonia, found to have flu.  RD met with pt who reports poor (but improving) appetite and intake.  She reports wt loss at home PTA due to arthritis pain. States her usual weight is 150 lbs, but she got down to 137 lbs at home. Currently she is 141 lbs which is 6% of her usual weight. She is currently eating 50% of her meals.  Pt is agreeable to Ensure Complete to support nutrition while hospitalized.  Encouraged pt to consume at least 50% of meals.  Pt meets criteria for moderate MALNUTRITION in the context of acute illness as evidenced by 8.6% wt loss in <3 months and poor PO meeting <50% of estimated needs reported by pt at home.  Height: Ht Readings from Last 1 Encounters:  07/18/13 5' 4" (1.626 m)    Weight: Wt Readings from Last 1 Encounters:  07/18/13 141 lb 15.6 oz (64.4 kg)    Ideal Body Weight: 120 lbs  % Ideal Body Weight: 117%  Wt Readings from Last 10 Encounters:  07/18/13 141 lb 15.6 oz (64.4 kg)  07/06/13 140 lb (63.504 kg)  11/06/12 153 lb (69.4 kg)  07/28/12 153 lb (69.4 kg)  07/28/12 153 lb (69.4 kg)  07/14/12 153 lb 0.6 oz (69.418 kg)  06/09/12 149 lb (67.586 kg)  05/20/12 153 lb (69.4 kg)  03/25/11 150 lb (68.04 kg)  09/15/10 153 lb  (69.4 kg)    Usual Body Weight: 150 lbs  % Usual Body Weight: 94%  BMI:  Body mass index is 24.36 kg/(m^2).  Estimated Nutritional Needs: Kcal: 1400-1600  Protein: 65-80g  Fluid: 1.4-1.6L/day  Skin: non-pitting edema  Diet Order: Cardiac  EDUCATION NEEDS: -Education needs addressed   Intake/Output Summary (Last 24 hours) at 07/20/13 1335 Last data filed at 07/20/13 0600  Gross per 24 hour  Intake 279.83 ml  Output      0 ml  Net 279.83 ml    Last BM: 3/29  Labs:   Recent Labs Lab 07/18/13 0443 07/19/13 0310 07/20/13 0300  NA 140 135* 132*  K 4.0 3.8 4.9  CL 101 100 101  CO2 29 24 17*  BUN _0 CREATININE 0.88 0.81 0.47*  CALCIUM 7.8* 8.0* 8.1*  GLUCOSE 83 70 70    CBG (last 3)  No results found for this basename: GLUCAP,  in the last 72 hours  Scheduled Meds: . allopurinol  100 mg Oral Daily  . aspirin  81 mg Oral Daily  . colchicine  0.6 mg Oral Daily  . guaiFENesin  600 mg Oral BID  . heparin  5,000 Units Subcutaneous 3 times per day  . lisinopril  5 mg Oral Daily  . oseltamivir  75 mg Oral BID  . piperacillin-tazobactam (ZOSYN)  IV  3.375 g Intravenous Q8H  . saccharomyces boulardii  250  mg Oral BID  . sodium chloride  3 mL Intravenous Q12H    Continuous Infusions: . sodium chloride 250 mL (07/19/13 2351)    Past Medical History  Diagnosis Date  . Arteriosclerotic cardiovascular disease (ASCVD)     Remote PTCA by patient report; LBBB; associated cardiomyopathy, presumed ischemic with EF 40-45% previously, 20% in 06/2009; h/o clinical congestive heart failure; negative stress nuclear in 2009 with inferoseptal and apical scar  . Anemia     Hgb of 9-10  . Hyperlipidemia   . Breast cancer   . Hypertension   . Dysrhythmia     LBBB  . Automatic implantable cardioverter-defibrillator in situ   . Pneumonia     "twice" (07/03/2013)  . Arthritis     "arms" (07/03/2013)  . HOH (hard of hearing)     Past Surgical History  Procedure  Laterality Date  . Total knee arthroplasty Right     Dr.Harrison  . Bi-ventricular implantable cardioverter defibrillator  (crt-d)  07/28/2012  . Cataract extraction w/ intraocular lens implant Left   . Tubal ligation    . Breast biopsy Bilateral   . Mastectomy Left 1998    Brynda Greathouse, MS RD LDN Clinical Inpatient Dietitian Pager: [phone] Weekend/After hours pager: [phone]

## 2013-07-21 ENCOUNTER — Encounter (HOSPITAL_COMMUNITY): Payer: Self-pay | Admitting: Physician Assistant

## 2013-07-21 DIAGNOSIS — E440 Moderate protein-calorie malnutrition: Secondary | ICD-10-CM | POA: Insufficient documentation

## 2013-07-21 MED ORDER — LEVOFLOXACIN 750 MG PO TABS
750.0000 mg | ORAL_TABLET | Freq: Once | ORAL | Status: AC
  Administered 2013-07-21: 750 mg via ORAL
  Filled 2013-07-21: qty 1

## 2013-07-21 MED ORDER — LISINOPRIL 10 MG PO TABS: 10.0000 mg | ORAL_TABLET | Freq: Every day | ORAL | Status: DC

## 2013-07-21 MED ORDER — LEVOFLOXACIN 750 MG PO TABS: 750.0000 mg | ORAL_TABLET | Freq: Every day | ORAL | Status: DC

## 2013-07-21 MED ORDER — CARVEDILOL 6.25 MG PO TABS: 6.2500 mg | ORAL_TABLET | Freq: Two times a day (BID) | ORAL | Status: DC

## 2013-07-21 MED ORDER — OSELTAMIVIR PHOSPHATE 75 MG PO CAPS: 75.0000 mg | ORAL_CAPSULE | ORAL | Status: DC | PRN

## 2013-07-21 NOTE — Progress Notes (Signed)
Reviewed discharge instructions with patient and niece, they stated their understanding.  Patient would like to arrange her own primary care followup appointment. First dose of po Levaquin given.   Discharged home with niece via wheelchair.  Sanda Linger

## 2013-07-21 NOTE — Progress Notes (Signed)
PATIENT DETAILS Name: [name] Age: 72 y.o. Sex: female Date of Birth: [birth year] Admit Date: 07/17/2013 Admitting Physician Bonnielee Haff, MD [id number], MD  Subjective: No major issues overnight  Assessment/Plan:   HCAP (healthcare-associated pneumonia) - Presumed Streptococcus pneumoniae pneumonia given urine antigen positive. - Currently on day 5 of Zosyn, vancomycin discontinued on 3/30 after 3 days of treatment. Blood cultures on 3/27 negative. -Remains significantly improved, with not fever. Would discharge on Levaquin to complete a total of 7 days of treatment. - Stable for discharge, I will sign off.  Influenza - As noted above, significantly improved - Continue with Tamiflu, stop date on 07/22/13.  Sepsis - Likely secondary to above, resolved. - Antimicrobial therapy as indicated above. Blood cultures negative, C. difficile PCR negative.  Acute renal failure -resolved, likely pre-renal  Recently diagnosed gouty arthritis:  -This appears to be better. Continue with colchicine and allopurinol.   Bradycardia - Cardiology's note, pacemaker was interrogated on 3/29, no significant abnormalities seen  Chronic systolic heart failure/ Biventricular ICD (implantable cardioverter-defibrillator) in place  -per cardiology  Disposition: Stable for discharge from hospitalist point of view  DVT Prophylaxis: Prophylactic Heparin  Code Status: Full code   Family Communication None at bedside  Procedures:  None   MEDICATIONS: Scheduled Meds: . allopurinol  100 mg Oral Daily  . aspirin  81 mg Oral Daily  . colchicine  0.6 mg Oral Daily  . feeding supplement (ENSURE COMPLETE)  237 mL Oral Q24H  . guaiFENesin  600 mg Oral BID  . heparin  5,000 Units Subcutaneous 3 times per day  . lisinopril  5 mg Oral Daily  . oseltamivir  75 mg Oral BID  . saccharomyces boulardii  250 mg Oral BID  . sodium chloride  3 mL Intravenous Q12H   Continuous  Infusions: . sodium chloride Stopped (07/21/13 0849)   PRN Meds:.acetaminophen, acetaminophen, albuterol, guaiFENesin-dextromethorphan, ondansetron (ZOFRAN) IV, ondansetron  Antibiotics: Anti-infectives   Start     Dose/Rate Route Frequency Ordered Stop   07/18/13 0600  vancomycin (VANCOCIN) IVPB 750 mg/150 ml premix  Status:  Discontinued     750 mg 150 mL/hr over 60 Minutes Intravenous Every 12 hours 07/17/13 1859 07/19/13 1837   07/18/13 0200  piperacillin-tazobactam (ZOSYN) IVPB 3.375 g  Status:  Discontinued     3.375 g 12.5 mL/hr over 240 Minutes Intravenous Every 8 hours 07/17/13 1901 07/20/13 1802   07/17/13 2215  oseltamivir (TAMIFLU) capsule 75 mg     75 mg Oral 2 times daily 07/17/13 2211 07/22/13 2159   07/17/13 1830  piperacillin-tazobactam (ZOSYN) IVPB 3.375 g     3.375 g 100 mL/hr over 30 Minutes Intravenous  Once 07/17/13 1826 07/17/13 2006   07/17/13 1830  vancomycin (VANCOCIN) IVPB 1000 mg/200 mL premix     1,000 mg 200 mL/hr over 60 Minutes Intravenous  Once 07/17/13 1826 07/17/13 2105       PHYSICAL EXAM: Vital signs in last 24 hours: Filed Vitals:   07/20/13 1600 07/20/13 1745 07/20/13 2045 07/21/13 0638  BP: 132/41 139/46 132/64 137/62  Pulse:  75 77 63  Temp: 98.7 F (37.1 C) 98.8 F (37.1 C) 98.7 F (37.1 C) 98.3 F (36.8 C)  TempSrc: Oral Oral Oral Oral  Resp:   18 18  Height:      Weight:      SpO2: 96% 98% 100% 96%    Weight change:  Filed Weights   07/17/13 2150 07/18/13  0700  Weight: 64.4 kg (141 lb 15.6 oz) 64.4 kg (141 lb 15.6 oz)   Body mass index is 24.36 kg/(m^2).   Gen Exam: Awake and alert with clear speech. Very hard of hearing. Neck: Supple, No JVD.   Chest:  Few scattered bibasilar rales CVS: S1 S2 Regular, no murmurs.  Abdomen: soft, BS +, non tender, non distended.  Extremities: no edema, lower extremities warm to touch. Neurologic: Non Focal.   Skin: No Rash.   Wounds: N/A.   Intake/Output from previous  day:  Intake/Output Summary (Last 24 hours) at 07/21/13 1028 Last data filed at 07/21/13 0900  Gross per 24 hour  Intake   1129 ml  Output      0 ml  Net   1129 ml     LAB RESULTS: CBC  Recent Labs Lab 07/17/13 1856 07/18/13 0443 07/19/13 2011  WBC 16.3* 23.7* 7.4  HGB 9.6* 8.1* 8.4*  HCT 31.2* 26.5* 26.5*  PLT 282 249 211  MCV 71.7* 72.2* 71.2*  MCH 22.1* 22.1* 22.6*  MCHC 30.8 30.6 31.7  RDW 20.6* 20.6* 20.5*  LYMPHSABS 1.4  --  1.0  MONOABS 0.9  --  0.7  EOSABS 0.0  --  0.0  BASOSABS 0.0  --  0.0    Chemistries   Recent Labs Lab 07/17/13 1856 07/18/13 0443 07/19/13 0310 07/20/13 0300  NA 142 140 135* 132*  K 4.6 4.0 3.8 4.9  CL 97 101 100 101  CO2 32 29 24 17*  GLUCOSE 95 83 70 70  BUN 34* 23 15 12   CREATININE 1.21* 0.88 0.81 0.47*  CALCIUM 8.5 7.8* 8.0* 8.1*    CBG: No results found for this basename: GLUCAP,  in the last 168 hours  GFR Estimated Creatinine Clearance: 53.3 ml/min (by C-G formula based on Cr of 0.47).  Coagulation profile  Recent Labs Lab 07/17/13 1856  INR 1.25    Cardiac Enzymes No results found for this basename: CK, CKMB, TROPONINI, MYOGLOBIN,  in the last 168 hours  No components found with this basename: POCBNP,  No results found for this basename: DDIMER,  in the last 72 hours No results found for this basename: HGBA1C,  in the last 72 hours No results found for this basename: CHOL, HDL, LDLCALC, TRIG, CHOLHDL, LDLDIRECT,  in the last 72 hours No results found for this basename: TSH, T4TOTAL, FREET3, T3FREE, THYROIDAB,  in the last 72 hours No results found for this basename: VITAMINB12, FOLATE, FERRITIN, TIBC, IRON, RETICCTPCT,  in the last 72 hours No results found for this basename: LIPASE, AMYLASE,  in the last 72 hours  Urine Studies No results found for this basename: UACOL, UAPR, USPG, UPH, UTP, UGL, UKET, UBIL, UHGB, UNIT, UROB, ULEU, UEPI, UWBC, URBC, UBAC, CAST, CRYS, UCOM, BILUA,  in the last 72  hours  MICROBIOLOGY: Recent Results (from the past 240 hour(s))  CULTURE, BLOOD (ROUTINE X 2)     Status: None   Collection Time    07/17/13  6:56 PM      Result Value Ref Range Status   Specimen Description BLOOD RIGHT FOREARM   Final   Special Requests BOTTLES DRAWN AEROBIC AND ANAEROBIC 5CC   Final   Culture NO GROWTH 4 DAYS   Final   Report Status PENDING   Incomplete  CULTURE, BLOOD (ROUTINE X 2)     Status: None   Collection Time    07/17/13  7:30 PM      Result Value Ref  Range Status   Specimen Description BLOOD RIGHT WRIST   Final   Special Requests BOTTLES DRAWN AEROBIC ONLY 5CC   Final   Culture NO GROWTH 4 DAYS   Final   Report Status PENDING   Incomplete  MRSA PCR SCREENING     Status: None   Collection Time    07/17/13 10:00 PM      Result Value Ref Range Status   MRSA by PCR NEGATIVE  NEGATIVE Final   Comment:            The GeneXpert MRSA Assay (FDA     approved for NASAL specimens     only), is one component of a     comprehensive MRSA colonization     surveillance program. It is not     intended to diagnose MRSA     infection nor to guide or     monitor treatment for     MRSA infections.  CLOSTRIDIUM DIFFICILE BY PCR     Status: None   Collection Time    07/19/13 10:40 AM      Result Value Ref Range Status   C difficile by pcr NEGATIVE  NEGATIVE Final    RADIOLOGY STUDIES/RESULTS: Dg Chest 2 View  07/03/2013   CLINICAL DATA:  Cough and congestion  EXAM: CHEST  2 VIEW  COMPARISON:  07/29/2012  FINDINGS: Cardiac shadow is within normal limits. A defibrillator is again noted. Mild interstitial changes are seen without focal confluent infiltrate. Postsurgical changes are noted in the left axilla.  IMPRESSION: No acute abnormality noted.   Electronically Signed   By: Inez Catalina M.D.   On: 07/03/2013 13:14   Dg Wrist 2 Views Left  07/03/2013   CLINICAL DATA:  Left wrist pain and swelling.  EXAM: LEFT WRIST - 2 VIEW  COMPARISON:  None.  FINDINGS: Diffuse  soft tissue swelling.  Unremarkable bones.  IMPRESSION: Diffuse soft tissue swelling without underlying bony abnormality.   Electronically Signed   By: Enrique Sack M.D.   On: 07/03/2013 20:03   Dg Wrist 2 Views Right  07/03/2013   CLINICAL DATA:  Bilateral wrist pain and swelling.  EXAM: RIGHT WRIST - 2 VIEW  COMPARISON:  None.  FINDINGS: Dorsal soft tissue swelling.  Unremarkable bones.  IMPRESSION: Dorsal soft tissue swelling without underlying bony abnormality.   Electronically Signed   By: Enrique Sack M.D.   On: 07/03/2013 20:02   Dg Chest Port 1 View  07/17/2013   CLINICAL DATA:  Fever and cough.  EXAM: PORTABLE CHEST - 1 VIEW  COMPARISON:  DG CHEST 2 VIEW dated 07/03/2013  FINDINGS: Left cardiac ICD appears stable. There is mild enlargement of the heart which appears unchanged. Densities at the left lung base and the left hemidiaphragm is obscured. Slightly enlarged interstitial densities at the right lung base. Negative for a pneumothorax.  IMPRESSION: Left basilar densities are concerning for airspace disease and small pleural effusion. Findings could represent pneumonia.  There may be mild dependent edema.   Electronically Signed   By: Markus Daft M.D.   On: 07/17/2013 18:53    Oren Binet, MD  [facility] Pager:[phone]  If 7PM-7AM, please contact night-coverage [email or web address] Password [email or web address] 07/21/2013, 10:28 AM   LOS: 4 days

## 2013-07-21 NOTE — Progress Notes (Signed)
Patient Name: [name] Date of Encounter: 07/21/2013     Principal Problem:   HCAP (healthcare-associated pneumonia) Active Problems:   Chronic systolic heart failure   Biventricular ICD (implantable cardioverter-defibrillator) in place   Sepsis   ARF (acute renal failure)   PNA (pneumonia)   Malnutrition of moderate degree    SUBJECTIVE  Feeling okay. Cough better. Clear sputum. Ready to go home.   CURRENT MEDS . allopurinol  100 mg Oral Daily  . aspirin  81 mg Oral Daily  . colchicine  0.6 mg Oral Daily  . feeding supplement (ENSURE COMPLETE)  237 mL Oral Q24H  . guaiFENesin  600 mg Oral BID  . heparin  5,000 Units Subcutaneous 3 times per day  . lisinopril  5 mg Oral Daily  . oseltamivir  75 mg Oral BID  . saccharomyces boulardii  250 mg Oral BID  . sodium chloride  3 mL Intravenous Q12H    OBJECTIVE  Filed Vitals:   07/20/13 1600 07/20/13 1745 07/20/13 2045 07/21/13 0638  BP: 132/41 139/46 132/64 137/62  Pulse:  75 77 63  Temp: 98.7 F (37.1 C) 98.8 F (37.1 C) 98.7 F (37.1 C) 98.3 F (36.8 C)  TempSrc: Oral Oral Oral Oral  Resp:   18 18  Height:      Weight:      SpO2: 96% 98% 100% 96%    Intake/Output Summary (Last 24 hours) at 07/21/13 0951 Last data filed at 07/21/13 0900  Gross per 24 hour  Intake   1129 ml  Output      0 ml  Net   1129 ml   Filed Weights   07/17/13 2150 07/18/13 0700  Weight: 141 lb 15.6 oz (64.4 kg) 141 lb 15.6 oz (64.4 kg)    PHYSICAL EXAM  General: Pleasant, NAD. Hard of hearing  Neuro: Alert and oriented X 3. Moves all extremities spontaneously. Psych: Normal affect. HEENT:  Normal  Neck: Supple without bruits or JVD.  Lungs:  Resp regular and unlabored, some rales, with cough Heart: RRR no s3, s4, +murmur. Abdomen: Soft, non-tender, non-distended, BS + x 4.  Extremities: No clubbing, cyanosis or edema. DP/PT/Radials 2+ and equal bilaterally.  Accessory Clinical Findings  CBC  Recent Labs   07/19/13 2011  WBC 7.4  NEUTROABS 5.7  HGB 8.4*  HCT 26.5*  MCV 71.2*  PLT 123456   Basic Metabolic Panel  Recent Labs  07/19/13 0310 07/20/13 0300  NA 135* 132*  K 3.8 4.9  CL 100 101  CO2 24 17*  GLUCOSE 70 70  BUN 15 12  CREATININE 0.81 0.47*  CALCIUM 8.0* 8.1*    TELE  Paced HR in 60-90s  Radiology/Studies   Dg Chest Port 1 View  07/17/2013   CLINICAL DATA:  Fever and cough.  EXAM: PORTABLE CHEST - 1 VIEW  COMPARISON:  DG CHEST 2 VIEW dated 07/03/2013  FINDINGS: Left cardiac ICD appears stable. There is mild enlargement of the heart which appears unchanged. Densities at the left lung base and the left hemidiaphragm is obscured. Slightly enlarged interstitial densities at the right lung base. Negative for a pneumothorax.  IMPRESSION: Left basilar densities are concerning for airspace disease and small pleural effusion. Findings could represent pneumonia.  There may be mild dependent edema.   ASSESSMENT AND PLAN [name] is a 73 y.o. female with a history of chronic systolic congestive heart failure, CAD s/p remote MI, ICM with EF 30-35% s/p BiV ICD implantation  who was admitted to [facility] on 07/17/13 with cough, fatigue and diarrhea (c dif neg). She tested positive for influenza B as was also found to have a UTI and CXR concerning for PNA. While at [facility] it was reported she had HR in 30s although these strips could not be found. With concerns about pacemaker malfunctioning she was transferred to [facility] on 07/19/13 to have her BiV ICD interrogated.  ICM with EF 30-35% s/p BiV ICD implantation-  While in [facility] her HR is in 50's, BiV ICD interrogation showed that the pacer's low rate is set at 50 BPM. There were no attached ECGs or strips showing bradycardia. Pacer functioning well per interrogation.   Chronic systolic CHF- compensated. No overt CHF, continue ACE, Lasix and K+ -- BB discontinued in the setting of flu and bradycardia  CAD- continue ASA, Statin and resume BB when  felt stable  HCAP (she was recently hospitalized earlier this month for chest pain rule out) -- Urine Strep pneumo antigen positive -- Continue Zosyn for one more day. Could give one dose of Levaquin 750 mg this AM before d/c to convert her for 7 day course, as directed by the IM team.   Influenza B- cont Tamiflu.  Gout- continue colchicine    Savannah Brass Minus Breeding PA-C  Pager [phone]  History and all data above reviewed.  Patient examined.  I agree with the findings as above. The patient feels well and is ready to go home.  No SOB The patient exam reveals COR:RRR  ,  Lungs: Clear  ,  Abd: Positive bowel sounds, no rebound no guarding, Ext No edema  .  All available labs, radiology testing, previous records reviewed. Agree with documented assessment and plan. OK to go home.  Continue antibiotics as above.    Jeneen Rinks Derelle Cockrell  10:03 AM  07/21/2013

## 2013-07-21 NOTE — Discharge Instructions (Signed)
Influenza, Adult Influenza (flu) is an infection in the mouth, nose, and throat (respiratory tract) caused by a virus. The flu can make you feel very ill. Influenza spreads easily from person to person (contagious).  HOME CARE   Only take medicines as told by your doctor.  Use a cool mist humidifier to make breathing easier.  Get plenty of rest until your fever goes away. This usually takes 3 to 4 days.  Drink enough fluids to keep your pee (urine) clear or pale yellow.  Cover your mouth and nose when you cough or sneeze.  Wash your hands well to avoid spreading the flu.  Stay home from work or school until your fever has been gone for at least 1 full day.  Get a flu shot every year. GET HELP RIGHT AWAY IF:   You have trouble breathing or feel short of breath.  Your skin or nails turn blue.  You have severe neck pain or stiffness.  You have a severe headache, facial pain, or earache.  Your fever gets worse or keeps coming back.  You feel sick to your stomach (nauseous), throw up (vomit), or have watery poop (diarrhea).  You have chest pain.  You have a deep cough that gets worse, or you cough up more thick spit (mucus). MAKE SURE YOU:   Understand these instructions.  Will watch your condition.  Will get help right away if you are not doing well or get worse. Document Released: 01/17/2008 Document Revised: 10/09/2011 Document Reviewed: 07/09/2011 [facility] Patient Information 2014 [place], [place].  Pneumonia, Adult Pneumonia is an infection of the lungs. It may be caused by a germ (virus or bacteria). Some types of pneumonia can spread easily from person to person. This can happen when you cough or sneeze. HOME CARE  Only take medicine as told by your doctor.  Take your medicine (antibiotics) as told. Finish it even if you start to feel better.  Do not smoke.  You may use a vaporizer or humidifier in your room. This can help loosen thick spit (mucus).  Sleep  so you are almost sitting up (semi-upright). This helps reduce coughing.  Rest. A shot (vaccine) can help prevent pneumonia. Shots are often advised for:  People over 72 years old.  Patients on chemotherapy.  People with long-term (chronic) lung problems.  People with immune system problems. GET HELP RIGHT AWAY IF:   You are getting worse.  You cannot control your cough, and you are losing sleep.  You cough up blood.  Your pain gets worse, even with medicine.  You have a fever.  Any of your problems are getting worse, not better.  You have shortness of breath or chest pain. MAKE SURE YOU:   Understand these instructions.  Will watch your condition.  Will get help right away if you are not doing well or get worse. Document Released: 09/26/2007 Document Revised: 07/02/2011 Document Reviewed: 06/30/2010 [facility] Patient Information 2014 [place].

## 2013-07-21 NOTE — Discharge Summary (Signed)
Discharge Summary   Patient ID: [name] MRN: [id number], DOB/AGE: 1940/06/22 73 y.o. Admit date: 07/17/2013 D/C date:     07/21/2013  Primary Cardiologist: Dr. Lovena Le  Principal Problem:   HCAP (healthcare-associated pneumonia) Active Problems:   HYPERLIPIDEMIA   Chronic systolic heart failure   Hypertension   Biventricular ICD (implantable cardioverter-defibrillator) in place   Sepsis   ARF (acute renal failure)   PNA (pneumonia)   Admission Dates: 07/17/13 - 07/21/13 Discharge Diagnosis: Influenza B and HCAP transferred to [facility] for bradycardia and pacemaker interrogation  HPI: [name] is a 73 y.o. female with a history of chronic systolic congestive heart failure, CAD s/p remote MI, ICM with EF 30-35% s/p BiV ICD implantation who was admitted to [facility] on 07/17/13 with cough, fatigue and diarrhea. She was febrile, with elevated WBC (20K) and hypotensive. She tested positive for influenza B and was also found to have HCAP. While at [facility] it was reported she had HR in 30s, although these strips could not be found. With concerns about pacemaker malfunctioning she was transferred to [facility] on 07/19/13 to be followed more closely by cardiology and have her BiV ICD interrogated.   Hospital Course:  ICM with EF 30-35% s/p BiV ICD implantation- The patient's heart rate has been stable in the ~ 60-70's while at [facility]. BiV ICD interrogation showed that the pacer's low rate is set at 50 BPM. There were no attached ECGs or strips showing bradycardia. Pacer functioning well per interrogation.   Chronic systolic CHF- elevated BNP (2593) but felt to be compensated. No overt CHF. Continue ACE, Lasix and K+  -- Resume BB, which was withheld on presentation secondary to hypotension in the setting of sepsis.   HCAP with sepsis. (she was recently hospitalized earlier this month for chest pain rule out)  -- Urine Strep pneumo antigen positive. -- Blood cultures negative -- Treated with Zosyn.  Given one dose of Levaquin 750 mg this AM before d/c to convert her for 7 day course  Influenza B- PCR confirmed. Continue Tamiflu. Has received 8 of 10 doses.  CAD- Stable. Continue ASA, statin and BB  Hypertension- patient has been stable on Lisinopril 5 mg   AKI- resolved   The patient has stabilized and is recovering well. She has been seen by Dr. Percival Spanish today and deemed stable for discharge home. All follow-up appointments have been scheduled. Discharge medications include ASA, carvedilol 6.25 mg BID, lisinopril 10mg , Lasix 40mg  BID, Kdur 10 mEq BID, lovastatin 40 mg, Levaquin 750mg  x 6 days. She was given one dose of Levaquin on the day of discharge and will complete the 7day course at home. She has two more doses of Tamiflu: one tonight and then tomorrow morning to complete a 5 day regimen. The resumption of other home meds including amlodipine and spirolactone can be assessed at follow up appointment.    Discharge Vitals: Blood pressure 137/62, pulse 63, temperature 98.3 F (36.8 C), temperature source Oral, resp. rate 18, height 5\' 4"  (1.626 m), weight 141 lb 15.6 oz (64.4 kg), SpO2 96.00%.  Labs: Lab Results  Component Value Date   WBC 7.4 07/19/2013   HGB 8.4* 07/19/2013   HCT 26.5* 07/19/2013   MCV 71.2* 07/19/2013   PLT 211 07/19/2013    Recent Labs Lab 07/17/13 1856  07/20/13 0300  NA 142  < > 132*  K 4.6  < > 4.9  CL 97  < > 101  CO2 32  < >  17*  BUN 34*  < > 12  CREATININE 1.21*  < > 0.47*  CALCIUM 8.5  < > 8.1*  PROT 7.4  --   --   BILITOT 0.4  --   --   ALKPHOS 92  --   --   ALT 24  --   --   AST 21  --   --   GLUCOSE 95  < > 70  < > = values in this interval not displayed.   Diagnostic Studies/Procedures    Dg Chest Port 1 View  07/17/2013   CLINICAL DATA:  Fever and cough.  EXAM: PORTABLE CHEST - 1 VIEW  COMPARISON:  DG CHEST 2 VIEW dated 07/03/2013  FINDINGS: Left cardiac ICD appears stable. There is mild enlargement of the heart which appears  unchanged. Densities at the left lung base and the left hemidiaphragm is obscured. Slightly enlarged interstitial densities at the right lung base. Negative for a pneumothorax.  IMPRESSION: Left basilar densities are concerning for airspace disease and small pleural effusion. Findings could represent pneumonia.  There may be mild dependent edema.     Discharge Medications     Medication List    STOP taking these medications       amLODipine 5 MG tablet  Commonly known as:  NORVASC      TAKE these medications       allopurinol 100 MG tablet  Commonly known as:  ZYLOPRIM  Take 100 mg by mouth daily.     aspirin 81 MG tablet  Take 81 mg by mouth daily.     carvedilol 6.25 MG tablet  Commonly known as:  COREG  Take 1 tablet (6.25 mg total) by mouth 2 (two) times daily with a meal.     colchicine 0.6 MG tablet  Take 0.6 mg by mouth 2 (two) times daily.     furosemide 40 MG tablet  Commonly known as:  LASIX  Take 40 mg by mouth 2 (two) times daily.     ibuprofen 200 MG tablet  Commonly known as:  ADVIL,MOTRIN  Take 1 tablet (200 mg total) by mouth every 6 (six) hours as needed for fever, headache, mild pain or moderate pain.     levofloxacin 750 MG tablet  Commonly known as:  LEVAQUIN  Take 1 tablet (750 mg total) by mouth daily.  Start taking on:  07/22/2013     lisinopril 10 MG tablet  Commonly known as:  PRINIVIL,ZESTRIL  Take 1 tablet (10 mg total) by mouth daily.     lovastatin 40 MG tablet  Commonly known as:  MEVACOR  Take 1 tablet by mouth at bedtime.     omeprazole 20 MG capsule  Commonly known as:  PRILOSEC  Take 20 mg by mouth daily. For gas/heartburn     oseltamivir 75 MG capsule  Commonly known as:  TAMIFLU  Take 1 capsule (75 mg total) by mouth as needed. Take one tablet tonight and one tomorrow morning     potassium chloride 10 MEQ tablet  Commonly known as:  K-DUR  Take 1 tablet (10 mEq total) by mouth 2 (two) times daily.        Disposition    The patient will be discharged in stable condition to home.  Future Appointments Provider Department Dept Phone   07/24/2013 2:50 PM Lendon Colonel, NP [facility] Linna Hoff [phone]     Follow-up Information   Follow up with Jory Sims, NP On 07/24/2013. (@ 2:50pm)  Specialty:  Nurse Practitioner   Contact information:   [address] [place]  [address] [phone]         Duration of Discharge Encounter: Greater than 30 minutes including physician and PA time.  SignedVertell Limber, KATHRYN PA-C 07/21/2013, 1:24 PM   Patient seen and examined.  Plan as discussed in my rounding note for today and outlined above.  The patient will be seen for TOC appt in a few days.  Her antihypertensives, which have been greatly reduced during this admission, will be reviewed. I suspect that she will need titration back up on these doses.   Jeneen Rinks [facility]  07/21/2013  1:48 PM

## 2013-07-22 LAB — CULTURE, BLOOD (ROUTINE X 2)
CULTURE: NO GROWTH
Culture: NO GROWTH

## 2013-07-24 ENCOUNTER — Encounter: Payer: Self-pay | Admitting: Cardiovascular Disease

## 2013-07-24 ENCOUNTER — Ambulatory Visit (INDEPENDENT_AMBULATORY_CARE_PROVIDER_SITE_OTHER): Payer: Medicare HMO | Admitting: Cardiovascular Disease

## 2013-07-24 ENCOUNTER — Encounter: Payer: Medicare HMO | Admitting: Adult Health

## 2013-07-24 VITALS — BP 139/57 | HR 57 | Ht 64.0 in | Wt 134.0 lb

## 2013-07-24 DIAGNOSIS — E785 Hyperlipidemia, unspecified: Secondary | ICD-10-CM

## 2013-07-24 DIAGNOSIS — I2510 Atherosclerotic heart disease of native coronary artery without angina pectoris: Secondary | ICD-10-CM

## 2013-07-24 DIAGNOSIS — I255 Ischemic cardiomyopathy: Secondary | ICD-10-CM

## 2013-07-24 DIAGNOSIS — I10 Essential (primary) hypertension: Secondary | ICD-10-CM

## 2013-07-24 DIAGNOSIS — I7090 Unspecified atherosclerosis: Secondary | ICD-10-CM

## 2013-07-24 DIAGNOSIS — I5022 Chronic systolic (congestive) heart failure: Secondary | ICD-10-CM

## 2013-07-24 DIAGNOSIS — I519 Heart disease, unspecified: Secondary | ICD-10-CM

## 2013-07-24 DIAGNOSIS — Z95810 Presence of automatic (implantable) cardiac defibrillator: Secondary | ICD-10-CM

## 2013-07-24 DIAGNOSIS — I2589 Other forms of chronic ischemic heart disease: Secondary | ICD-10-CM

## 2013-07-24 NOTE — Progress Notes (Signed)
Patient ID: SHYVONNE PAULEY, female   DOB: 31-Dec-1940, 73 y.o.   MRN: [id number]      SUBJECTIVE: JULIEANA DEMIRJIAN is a 73 y.o. female with a history of chronic systolic congestive heart failure, CAD s/p remote MI, ICM with EF 30-35% s/p BiV ICD implantation who was recently hospitalized for influenza and healthcare associated pneumonia. Her device was interrogated during this time and was found to be functioning normally. She had been hypotensive secondary to sepsis, and amlodipine and spironolactone were withheld at the time of discharge. An echocardiogram performed in March showed severely reduced left ventricular systolic function, EF 99991111, grade 2 diastolic dysfunction, moderate left atrial enlargement, and mild mitral regurgitation.  She denies chest pain and her exertional dyspnea is much improved. She has felt weak since getting out of hospital but feels this is improving daily. She wonders about which foods are good for her to eat. He denies leg swelling and palpitations.    No Known Allergies  Current Outpatient Prescriptions  Medication Sig Dispense Refill  . aspirin 81 MG tablet Take 81 mg by mouth daily.      . carvedilol (COREG) 6.25 MG tablet Take 1 tablet (6.25 mg total) by mouth 2 (two) times daily with a meal.  60 tablet  5  . colchicine 0.6 MG tablet Take 0.6 mg by mouth 2 (two) times daily.      . furosemide (LASIX) 40 MG tablet Take 40 mg by mouth 2 (two) times daily.      [name] ibuprofen (ADVIL,MOTRIN) 200 MG tablet Take 1 tablet (200 mg total) by mouth every 6 (six) hours as needed for fever, headache, mild pain or moderate pain.      [name] levofloxacin (LEVAQUIN) 750 MG tablet Take 1 tablet (750 mg total) by mouth daily.  6 tablet  0  . lisinopril (PRINIVIL,ZESTRIL) 10 MG tablet Take 1 tablet (10 mg total) by mouth daily.  30 tablet  0  . lovastatin (MEVACOR) 40 MG tablet Take 1 tablet by mouth at bedtime.      [name] omeprazole (PRILOSEC) 20 MG capsule Take 20 mg by mouth daily. For  gas/heartburn      . oseltamivir (TAMIFLU) 75 MG capsule Take 1 capsule (75 mg total) by mouth as needed. Take one tablet tonight and one tomorrow morning  2 capsule  0  . potassium chloride (K-DUR) 10 MEQ tablet Take 1 tablet (10 mEq total) by mouth 2 (two) times daily.  60 tablet  0   No current facility-administered medications for this visit.    Past Medical History  Diagnosis Date  . Arteriosclerotic cardiovascular disease (ASCVD)     Remote PTCA by patient report; LBBB; associated cardiomyopathy, presumed ischemic with EF 40-45% previously, 20% in 06/2009; h/o clinical congestive heart failure; negative stress nuclear in 2009 with inferoseptal and apical scar  . Anemia     Hgb of 9-10  . Hyperlipidemia   . Breast cancer   . Hypertension   . Automatic implantable cardioverter-defibrillator in situ   . HOH (hard of hearing)     Past Surgical History  Procedure Laterality Date  . Total knee arthroplasty Right     Dr.Harrison  . Bi-ventricular implantable cardioverter defibrillator  (crt-d)  07/28/2012  . Cataract extraction w/ intraocular lens implant Left   . Tubal ligation    . Breast biopsy Bilateral   . Mastectomy Left 1998    History   Social History  . Marital Status: Married  Spouse Name: N/A    Number of Children: N/A  . Years of Education: N/A   Occupational History  . Retired    Social History Main Topics  . Smoking status: Former Smoker    Types: Cigarettes  . Smokeless tobacco: Current User    Types: Chew     Comment: 07/03/2013 "smoked some; don't know how much or how long or when I quit"  . Alcohol Use: No  . Drug Use: No  . Sexual Activity: Not Currently   Other Topics Concern  . Not on file   Social History Narrative  . No narrative on file     Filed Vitals:   07/24/13 1116  BP: 139/57  Pulse: 57  Height: 5\' 4"  (1.626 m)  Weight: 134 lb (60.782 kg)    PHYSICAL EXAM General: NAD Neck: No JVD, no thyromegaly. Lungs: Clear to  auscultation bilaterally with normal respiratory effort. CV: Nondisplaced PMI.  Regular rate and rhythm, normal S1/S2, no S3/S4, soft II/VI systolic murmur over RUSB. No pretibial or periankle edema.  No carotid bruit.  Normal pedal pulses.  Abdomen: Soft, nontender, no hepatosplenomegaly, no distention.  Neurologic: Alert and oriented x 3.  Psych: Normal affect. Extremities: No clubbing or cyanosis.   ECG: reviewed and available in electronic records.      ASSESSMENT AND PLAN: 1. Ischemic cardiomyopathy/chronic systolic heart failure, EF 30-35%: She appears to be compensated. I will continue Lasix 40 mg twice daily. For the time being, I will withhold starting her back on spironolactone. I will continue carvedilol 6.25 mg twice daily along with lisinopril 10 mg daily. I've given her handouts on a heart healthy diet and discussed this in detail with her. I emphasized the importance of a low sodium diet. 2. CAD: Symptomatically stable. Continue aspirin, beta blocker, and lovastatin. 3. Hypertension: Controlled on current therapy which includes carvedilol and lisinopril. 4. BiVICD: Recently interrogated and found to be functioning normally.  Dispo: f/u 6 weeks.  Kate Sable, M.D., F.A.C.C.

## 2013-07-24 NOTE — Patient Instructions (Signed)
Your physician recommends that you schedule a follow-up appointment in: 6 weeks    Your physician recommends that you continue on your current medications as directed. Please refer to the Current Medication list given to you today.   I have provided you with literature "Living Better with Heart Failure"  And pamphlet "Chronic Heart failure"

## 2013-08-04 ENCOUNTER — Encounter: Payer: Medicare HMO | Admitting: Adult Health

## 2013-08-19 ENCOUNTER — Encounter (HOSPITAL_COMMUNITY): Payer: Medicare HMO | Attending: Hematology and Oncology

## 2013-08-19 ENCOUNTER — Encounter (HOSPITAL_BASED_OUTPATIENT_CLINIC_OR_DEPARTMENT_OTHER): Payer: Medicare HMO

## 2013-08-19 ENCOUNTER — Encounter (HOSPITAL_COMMUNITY): Payer: Self-pay

## 2013-08-19 VITALS — BP 136/73 | HR 99 | Temp 98.5°F | Resp 18 | Wt 135.5 lb

## 2013-08-19 DIAGNOSIS — M069 Rheumatoid arthritis, unspecified: Secondary | ICD-10-CM

## 2013-08-19 DIAGNOSIS — I2589 Other forms of chronic ischemic heart disease: Secondary | ICD-10-CM | POA: Insufficient documentation

## 2013-08-19 DIAGNOSIS — Z87891 Personal history of nicotine dependence: Secondary | ICD-10-CM | POA: Insufficient documentation

## 2013-08-19 DIAGNOSIS — I5032 Chronic diastolic (congestive) heart failure: Secondary | ICD-10-CM | POA: Insufficient documentation

## 2013-08-19 DIAGNOSIS — H9190 Unspecified hearing loss, unspecified ear: Secondary | ICD-10-CM | POA: Insufficient documentation

## 2013-08-19 DIAGNOSIS — C50919 Malignant neoplasm of unspecified site of unspecified female breast: Secondary | ICD-10-CM

## 2013-08-19 DIAGNOSIS — D509 Iron deficiency anemia, unspecified: Secondary | ICD-10-CM

## 2013-08-19 DIAGNOSIS — Z96659 Presence of unspecified artificial knee joint: Secondary | ICD-10-CM | POA: Insufficient documentation

## 2013-08-19 DIAGNOSIS — I10 Essential (primary) hypertension: Secondary | ICD-10-CM | POA: Insufficient documentation

## 2013-08-19 DIAGNOSIS — I509 Heart failure, unspecified: Secondary | ICD-10-CM | POA: Insufficient documentation

## 2013-08-19 DIAGNOSIS — Z853 Personal history of malignant neoplasm of breast: Secondary | ICD-10-CM

## 2013-08-19 DIAGNOSIS — I5030 Unspecified diastolic (congestive) heart failure: Secondary | ICD-10-CM

## 2013-08-19 DIAGNOSIS — D649 Anemia, unspecified: Secondary | ICD-10-CM

## 2013-08-19 DIAGNOSIS — E785 Hyperlipidemia, unspecified: Secondary | ICD-10-CM | POA: Insufficient documentation

## 2013-08-19 DIAGNOSIS — Z95810 Presence of automatic (implantable) cardiac defibrillator: Secondary | ICD-10-CM | POA: Insufficient documentation

## 2013-08-19 DIAGNOSIS — Z9010 Acquired absence of unspecified breast and nipple: Secondary | ICD-10-CM | POA: Insufficient documentation

## 2013-08-19 DIAGNOSIS — I5022 Chronic systolic (congestive) heart failure: Secondary | ICD-10-CM

## 2013-08-19 LAB — CBC WITH DIFFERENTIAL/PLATELET
BASOS PCT: 0 % (ref 0–1)
Basophils Absolute: 0 10*3/uL (ref 0.0–0.1)
EOS PCT: 1 % (ref 0–5)
Eosinophils Absolute: 0.1 10*3/uL (ref 0.0–0.7)
HCT: 28.5 % — ABNORMAL LOW (ref 36.0–46.0)
HEMOGLOBIN: 8.8 g/dL — AB (ref 12.0–15.0)
LYMPHS ABS: 1.9 10*3/uL (ref 0.7–4.0)
Lymphocytes Relative: 23 % (ref 12–46)
MCH: 21.8 pg — ABNORMAL LOW (ref 26.0–34.0)
MCHC: 30.9 g/dL (ref 30.0–36.0)
MCV: 70.7 fL — AB (ref 78.0–100.0)
MONO ABS: 1.1 10*3/uL — AB (ref 0.1–1.0)
Monocytes Relative: 14 % — ABNORMAL HIGH (ref 3–12)
Neutro Abs: 5.1 10*3/uL (ref 1.7–7.7)
Neutrophils Relative %: 63 % (ref 43–77)
Platelets: 338 10*3/uL (ref 150–400)
RBC: 4.03 MIL/uL (ref 3.87–5.11)
RDW: 21.3 % — ABNORMAL HIGH (ref 11.5–15.5)
WBC: 8.2 10*3/uL (ref 4.0–10.5)

## 2013-08-19 LAB — CANCER ANTIGEN 27.29: CA 27.29: 37 U/mL (ref 0–39)

## 2013-08-19 LAB — RETICULOCYTES
RBC.: 4.03 MIL/uL (ref 3.87–5.11)
RETIC CT PCT: 1.5 % (ref 0.4–3.1)
Retic Count, Absolute: 60.5 10*3/uL (ref 19.0–186.0)

## 2013-08-19 LAB — COMPREHENSIVE METABOLIC PANEL
ALT: 10 U/L (ref 0–35)
AST: 17 U/L (ref 0–37)
Albumin: 2.8 g/dL — ABNORMAL LOW (ref 3.5–5.2)
Alkaline Phosphatase: 103 U/L (ref 39–117)
BILIRUBIN TOTAL: 0.2 mg/dL — AB (ref 0.3–1.2)
BUN: 18 mg/dL (ref 6–23)
CHLORIDE: 101 meq/L (ref 96–112)
CO2: 27 meq/L (ref 19–32)
CREATININE: 0.83 mg/dL (ref 0.50–1.10)
Calcium: 9.1 mg/dL (ref 8.4–10.5)
GFR, EST AFRICAN AMERICAN: 79 mL/min — AB (ref >90)
GFR, EST NON AFRICAN AMERICAN: 68 mL/min — AB (ref >90)
GLUCOSE: 109 mg/dL — AB (ref 70–99)
Potassium: 3.2 mEq/L — ABNORMAL LOW (ref 3.7–5.3)
Sodium: 143 mEq/L (ref 137–147)
Total Protein: 7.7 g/dL (ref 6.0–8.3)

## 2013-08-19 LAB — LACTATE DEHYDROGENASE: LDH: 194 U/L (ref 94–250)

## 2013-08-19 LAB — IRON AND TIBC
Iron: 20 ug/dL — ABNORMAL LOW (ref 42–135)
Saturation Ratios: 9 % — ABNORMAL LOW (ref 20–55)
TIBC: 222 ug/dL — ABNORMAL LOW (ref 250–470)
UIBC: 202 ug/dL (ref 125–400)

## 2013-08-19 LAB — FERRITIN: Ferritin: 30 ng/mL (ref 10–291)

## 2013-08-19 LAB — CEA: CEA: 4.2 ng/mL (ref 0.0–5.0)

## 2013-08-19 NOTE — Progress Notes (Signed)
Labs drawn today for cbc/diff,ca2729,cea,cmp,ferr,Iron and IBC,ldh,retic,protein elect

## 2013-08-19 NOTE — Progress Notes (Signed)
[place] [facility]. [place], M.D.  NEW PATIENT EVALUATION   Name: [name] Date: 08/19/2013 MRN: [id number] DOB: 1940/09/21  PCP: [name], MD   REFERRING PHYSICIAN: [name], MD  REASON FOR REFERRAL: Anemia.     HISTORY OF PRESENT ILLNESS:[name] is a 73 y.o. female who is referred by her rheumatologist for anemia. Patient does get short of breath on exertion but denies any episodes of PND or orthopnea. She denies lower extremity swelling or redness but has had increased discomfort involving the left wrist recently. She denies any abdominal pain, diarrhea, constipation, melena, hematochezia, hematuria, epistaxis, or hemoptysis. She has had a remote history of breast cancer in 1992 treated with left modified radical mastectomy followed by 5 years of tamoxifen. She never received radiotherapy. She denies any dark urine, lymphadenopathy, or easy satiety.   PAST MEDICAL HISTORY:  has a past medical history of Arteriosclerotic cardiovascular disease (ASCVD); Anemia; Hyperlipidemia; Breast cancer; Hypertension; Automatic implantable cardioverter-defibrillator in situ; and HOH (hard of hearing).     PAST SURGICAL HISTORY: Past Surgical History  Procedure Laterality Date  . Total knee arthroplasty Right     Dr.Harrison  . Bi-ventricular implantable cardioverter defibrillator  (crt-d)  07/28/2012  . Cataract extraction w/ intraocular lens implant Left   . Tubal ligation    . Breast biopsy Bilateral   . Mastectomy Left 1998     CURRENT MEDICATIONS: has a current medication list which includes the following prescription(s): allopurinol, aspirin, carvedilol, colchicine, furosemide, ibuprofen, lisinopril, lovastatin, omeprazole, potassium chloride, levofloxacin, and oseltamivir.   ALLERGIES: Review of patient's allergies indicates no known allergies.   SOCIAL HISTORY:  reports that she has quit smoking. Her smoking use  included Cigarettes. She smoked 0.00 packs per day. Her smokeless tobacco use includes Chew. She reports that she does not drink alcohol or use illicit drugs.   FAMILY HISTORY: family history includes Cancer in her father; Diabetes in her father; Hypertension in her brother.    REVIEW OF SYSTEMS:  Other than that discussed above is noncontributory.    PHYSICAL EXAM:  weight is 135 lb 8 oz (61.462 kg). Her oral temperature is 98.5 F (36.9 C). Her blood pressure is 136/73 and her pulse is 99. Her respiration is 18 and oxygen saturation is 100%.    GENERAL:alert, no distress and comfortable SKIN: skin color, texture, turgor are normal, no rashes or significant lesions EYES: normal, Conjunctiva are pink and non-injected, sclera clear OROPHARYNX:no exudate, no erythema and lips, buccal mucosa, and tongue normal  NECK: supple, thyroid normal size, non-tender, without nodularity CHEST: Status post left mastectomy with no subcutaneous nodules. Right breast without mass. Pacemaker in place. LYMPH:  no palpable lymphadenopathy in the cervical, axillary or inguinal LUNGS: clear to auscultation and percussion with normal breathing effort HEART: regular rate & rhythm and no murmurs. No S3. ABDOMEN:abdomen soft, non-tender and normal bowel sounds MUSCULOSKELETALl:no cyanosis of digits, no clubbing or edema  NEURO: alert & oriented x 3 with fluent speech, no focal motor/sensory deficits. Hard of hearing.    LABORATORY DATA:  Infusion on 08/19/2013  Component Date Value Ref Range Status  . WBC 08/19/2013 8.2  4.0 - 10.5 K/uL Final  . RBC 08/19/2013 4.03  3.87 - 5.11 MIL/uL Final  . Hemoglobin 08/19/2013 8.8* 12.0 - 15.0 g/dL Final  . HCT 08/19/2013 28.5* 36.0 - 46.0 % Final  . MCV 08/19/2013 70.7* 78.0 - 100.0 fL Final  .  MCH 08/19/2013 21.8* 26.0 - 34.0 pg Final  . MCHC 08/19/2013 30.9  30.0 - 36.0 g/dL Final  . RDW 08/19/2013 21.3* 11.5 - 15.5 % Final  . Platelets 08/19/2013 338  150 - 400  K/uL Final  . Neutrophils Relative % 08/19/2013 63  43 - 77 % Final  . Neutro Abs 08/19/2013 5.1  1.7 - 7.7 K/uL Final  . Lymphocytes Relative 08/19/2013 23  12 - 46 % Final  . Lymphs Abs 08/19/2013 1.9  0.7 - 4.0 K/uL Final  . Monocytes Relative 08/19/2013 14* 3 - 12 % Final  . Monocytes Absolute 08/19/2013 1.1* 0.1 - 1.0 K/uL Final  . Eosinophils Relative 08/19/2013 1  0 - 5 % Final  . Eosinophils Absolute 08/19/2013 0.1  0.0 - 0.7 K/uL Final  . Basophils Relative 08/19/2013 0  0 - 1 % Final  . Basophils Absolute 08/19/2013 0.0  0.0 - 0.1 K/uL Final  . Retic Ct Pct 08/19/2013 1.5  0.4 - 3.1 % Final  . RBC. 08/19/2013 4.03  3.87 - 5.11 MIL/uL Final  . Retic Count, Manual 08/19/2013 60.5  19.0 - 186.0 K/uL Final  Admission on 07/17/2013, Discharged on 07/21/2013  Component Date Value Ref Range Status  . Specimen Description 07/17/2013 BLOOD RIGHT FOREARM   Final  . Special Requests 07/17/2013 BOTTLES DRAWN AEROBIC AND ANAEROBIC 5CC   Final  . Culture 07/17/2013 NO GROWTH 5 DAYS   Final  . Report Status 07/17/2013 07/22/2013 FINAL   Final  . Specimen Description 07/17/2013 BLOOD RIGHT WRIST   Final  . Special Requests 07/17/2013 BOTTLES DRAWN AEROBIC ONLY 5CC   Final  . Culture 07/17/2013 NO GROWTH 5 DAYS   Final  . Report Status 07/17/2013 07/22/2013 FINAL   Final  . WBC 07/17/2013 16.3* 4.0 - 10.5 K/uL Final  . RBC 07/17/2013 4.35  3.87 - 5.11 MIL/uL Final  . Hemoglobin 07/17/2013 9.6* 12.0 - 15.0 g/dL Final  . HCT 07/17/2013 31.2* 36.0 - 46.0 % Final  . MCV 07/17/2013 71.7* 78.0 - 100.0 fL Final  . MCH 07/17/2013 22.1* 26.0 - 34.0 pg Final  . MCHC 07/17/2013 30.8  30.0 - 36.0 g/dL Final  . RDW 07/17/2013 20.6* 11.5 - 15.5 % Final  . Platelets 07/17/2013 282  150 - 400 K/uL Final  . Neutrophils Relative % 07/17/2013 86* 43 - 77 % Final  . Neutro Abs 07/17/2013 14.0* 1.7 - 7.7 K/uL Final  . Lymphocytes Relative 07/17/2013 8* 12 - 46 % Final  . Lymphs Abs 07/17/2013 1.4  0.7 - 4.0  K/uL Final  . Monocytes Relative 07/17/2013 6  3 - 12 % Final  . Monocytes Absolute 07/17/2013 0.9  0.1 - 1.0 K/uL Final  . Eosinophils Relative 07/17/2013 0  0 - 5 % Final  . Eosinophils Absolute 07/17/2013 0.0  0.0 - 0.7 K/uL Final  . Basophils Relative 07/17/2013 0  0 - 1 % Final  . Basophils Absolute 07/17/2013 0.0  0.0 - 0.1 K/uL Final  . Sodium 07/17/2013 142  137 - 147 mEq/L Final  . Potassium 07/17/2013 4.6  3.7 - 5.3 mEq/L Final  . Chloride 07/17/2013 97  96 - 112 mEq/L Final  . CO2 07/17/2013 32  19 - 32 mEq/L Final  . Glucose, Bld 07/17/2013 95  70 - 99 mg/dL Final  . BUN 07/17/2013 34* 6 - 23 mg/dL Final  . Creatinine, Ser 07/17/2013 1.21* 0.50 - 1.10 mg/dL Final  . Calcium 07/17/2013 8.5  8.4 - 10.5  mg/dL Final  . Total Protein 07/17/2013 7.4  6.0 - 8.3 g/dL Final  . Albumin 07/17/2013 2.5* 3.5 - 5.2 g/dL Final  . AST 07/17/2013 21  0 - 37 U/L Final  . ALT 07/17/2013 24  0 - 35 U/L Final  . Alkaline Phosphatase 07/17/2013 92  39 - 117 U/L Final  . Total Bilirubin 07/17/2013 0.4  0.3 - 1.2 mg/dL Final  . GFR calc non Af Amer 07/17/2013 43* >90 mL/min Final  . GFR calc Af Amer 07/17/2013 50* >90 mL/min Final   Comment: (NOTE)                          The eGFR has been calculated using the CKD EPI equation.                          This calculation has not been validated in all clinical situations.                          eGFR's persistently <90 mL/min signify possible Chronic Kidney                          Disease.  . Color, Urine 07/17/2013 YELLOW  YELLOW Final  . APPearance 07/17/2013 CLOUDY* CLEAR Final  . Specific Gravity, Urine 07/17/2013 1.010  1.005 - 1.030 Final  . pH 07/17/2013 5.5  5.0 - 8.0 Final  . Glucose, UA 07/17/2013 NEGATIVE  NEGATIVE mg/dL Final  . Hgb urine dipstick 07/17/2013 LARGE* NEGATIVE Final  . Bilirubin Urine 07/17/2013 NEGATIVE  NEGATIVE Final  . Ketones, ur 07/17/2013 NEGATIVE  NEGATIVE mg/dL Final  . Protein, ur 07/17/2013 NEGATIVE   NEGATIVE mg/dL Final  . Urobilinogen, UA 07/17/2013 0.2  0.0 - 1.0 mg/dL Final  . Nitrite 07/17/2013 NEGATIVE  NEGATIVE Final  . Leukocytes, UA 07/17/2013 MODERATE* NEGATIVE Final  . Lactic Acid, Venous 07/17/2013 2.2  0.5 - 2.2 mmol/L Final  . Prothrombin Time 07/17/2013 15.4* 11.6 - 15.2 seconds Final  . INR 07/17/2013 1.25  0.00 - 1.49 Final  . aPTT 07/17/2013 25  24 - 37 seconds Final  . Sodium 07/18/2013 140  137 - 147 mEq/L Final  . Potassium 07/18/2013 4.0  3.7 - 5.3 mEq/L Final  . Chloride 07/18/2013 101  96 - 112 mEq/L Final  . CO2 07/18/2013 29  19 - 32 mEq/L Final  . Glucose, Bld 07/18/2013 83  70 - 99 mg/dL Final  . BUN 07/18/2013 23  6 - 23 mg/dL Final  . Creatinine, Ser 07/18/2013 0.88  0.50 - 1.10 mg/dL Final  . Calcium 07/18/2013 7.8* 8.4 - 10.5 mg/dL Final  . GFR calc non Af Amer 07/18/2013 63* >90 mL/min Final  . GFR calc Af Amer 07/18/2013 73* >90 mL/min Final   Comment: (NOTE)                          The eGFR has been calculated using the CKD EPI equation.                          This calculation has not been validated in all clinical situations.                          eGFR's persistently <90  mL/min signify possible Chronic Kidney                          Disease.  . C difficile by pcr 07/19/2013 NEGATIVE  NEGATIVE Final  . Influenza A By PCR 07/17/2013 NEGATIVE  NEGATIVE Final  . Influenza B By PCR 07/17/2013 POSITIVE* NEGATIVE Final  . H1N1 flu by pcr 07/17/2013 NOT DETECTED  NOT DETECTED Final   Comment:                                 The Xpert Flu assay (FDA approved for                          nasal aspirates or washes and                          nasopharyngeal swab specimens), is                          intended as an aid in the diagnosis of                          influenza and should not be used as                          a sole basis for treatment.  [name] HIV 07/17/2013 NON REACTIVE  NON REACTIVE Final   Comment: (NOTE)                           Effective July 27, 2013, [facility] will no longer offer the                          current 3rd Generation HIV diagnostic screening assay, HIV Antibodies,                          HIV-1/2 EIA, with reflexes. At that time, [facility] will                          only offer HIV-1/2 Ag/Ab, 4th Gen, w/ Reflexes as recommended by the                          CDC. This HIV diagnostic screening assay tests for antibodies to HIV-1                          and HIV-2 as well as HIV p24 antigen and provides greater sensitivity                          for the detection of recent infection. Any orders for the 3rd                          Generation assay will automatically be referred to the 4th Generation  assay.                          Performed at [facility]  . Specimen Description 07/17/2013 URINE, CLEAN CATCH   Final  . Special Requests 07/17/2013 NONE   Final  . Legionella Antigen, Urine 07/17/2013    Final                   Value:Negative for Legionella pneumophilia serogroup 1                         Performed at [facility]  . Report Status 07/17/2013 07/19/2013 FINAL   Final  . Strep Pneumo Urinary Antigen 07/17/2013 POSITIVE* NEGATIVE Final   Comment: PERFORMED AT [facility]                          Performed at [facility]  . WBC 07/18/2013 23.7* 4.0 - 10.5 K/uL Final  . RBC 07/18/2013 3.67* 3.87 - 5.11 MIL/uL Final  . Hemoglobin 07/18/2013 8.1* 12.0 - 15.0 g/dL Final  . HCT 07/18/2013 26.5* 36.0 - 46.0 % Final  . MCV 07/18/2013 72.2* 78.0 - 100.0 fL Final  . MCH 07/18/2013 22.1* 26.0 - 34.0 pg Final  . MCHC 07/18/2013 30.6  30.0 - 36.0 g/dL Final  . RDW 07/18/2013 20.6* 11.5 - 15.5 % Final  . Platelets 07/18/2013 249  150 - 400 K/uL Final  . MRSA by PCR 07/17/2013 NEGATIVE  NEGATIVE Final   Comment:                                 The GeneXpert MRSA Assay (FDA                          approved for  NASAL specimens                          only), is one component of a                          comprehensive MRSA colonization                          surveillance program. It is not                          intended to diagnose MRSA                          infection nor to guide or                          monitor treatment for                          MRSA infections.  . Squamous Epithelial / LPF 07/17/2013 FEW* RARE Final  . WBC, UA 07/17/2013 3-6  <3 WBC/hpf Final  . RBC / HPF 07/17/2013 7-10  <3 RBC/hpf Final  . Bacteria, UA 07/17/2013 FEW* RARE Final  . Sodium 07/19/2013 135* 137 - 147 mEq/L Final  . Potassium 07/19/2013  3.8  3.7 - 5.3 mEq/L Final  . Chloride 07/19/2013 100  96 - 112 mEq/L Final  . CO2 07/19/2013 24  19 - 32 mEq/L Final  . Glucose, Bld 07/19/2013 70  70 - 99 mg/dL Final  . BUN 07/19/2013 15  6 - 23 mg/dL Final  . Creatinine, Ser 07/19/2013 0.81  0.50 - 1.10 mg/dL Final  . Calcium 07/19/2013 8.0* 8.4 - 10.5 mg/dL Final  . GFR calc non Af Amer 07/19/2013 70* >90 mL/min Final  . GFR calc Af Amer 07/19/2013 81* >90 mL/min Final   Comment: (NOTE)                          The eGFR has been calculated using the CKD EPI equation.                          This calculation has not been validated in all clinical situations.                          eGFR's persistently <90 mL/min signify possible Chronic Kidney                          Disease.  . Sodium 07/20/2013 132* 137 - 147 mEq/L Final  . Potassium 07/20/2013 4.9  3.7 - 5.3 mEq/L Final   Comment: SLIGHT HEMOLYSIS                          DELTA CHECK NOTED  . Chloride 07/20/2013 101  96 - 112 mEq/L Final  . CO2 07/20/2013 17* 19 - 32 mEq/L Final  . Glucose, Bld 07/20/2013 70  70 - 99 mg/dL Final  . BUN 07/20/2013 12  6 - 23 mg/dL Final  . Creatinine, Ser 07/20/2013 0.47* 0.50 - 1.10 mg/dL Final  . Calcium 07/20/2013 8.1* 8.4 - 10.5 mg/dL Final  . GFR calc non Af Amer 07/20/2013 >90  >90 mL/min Final  . GFR calc  Af Amer 07/20/2013 >90  >90 mL/min Final   Comment: (NOTE)                          The eGFR has been calculated using the CKD EPI equation.                          This calculation has not been validated in all clinical situations.                          eGFR's persistently <90 mL/min signify possible Chronic Kidney                          Disease.  . WBC 07/19/2013 7.4  4.0 - 10.5 K/uL Final  . RBC 07/19/2013 3.72* 3.87 - 5.11 MIL/uL Final  . Hemoglobin 07/19/2013 8.4* 12.0 - 15.0 g/dL Final  . HCT 07/19/2013 26.5* 36.0 - 46.0 % Final  . MCV 07/19/2013 71.2* 78.0 - 100.0 fL Final  . MCH 07/19/2013 22.6* 26.0 - 34.0 pg Final  . MCHC 07/19/2013 31.7  30.0 - 36.0 g/dL Final  . RDW 07/19/2013 20.5* 11.5 - 15.5 % Final  . Platelets 07/19/2013 211  150 - 400 K/uL  Final  . Neutrophils Relative % 07/19/2013 77  43 - 77 % Final  . Neutro Abs 07/19/2013 5.7  1.7 - 7.7 K/uL Final  . Lymphocytes Relative 07/19/2013 13  12 - 46 % Final  . Lymphs Abs 07/19/2013 1.0  0.7 - 4.0 K/uL Final  . Monocytes Relative 07/19/2013 10  3 - 12 % Final  . Monocytes Absolute 07/19/2013 0.7  0.1 - 1.0 K/uL Final  . Eosinophils Relative 07/19/2013 0  0 - 5 % Final  . Eosinophils Absolute 07/19/2013 0.0  0.0 - 0.7 K/uL Final  . Basophils Relative 07/19/2013 0  0 - 1 % Final  . Basophils Absolute 07/19/2013 0.0  0.0 - 0.1 K/uL Final  . Pro B Natriuretic peptide (BNP) 07/20/2013 2593.0* 0 - 125 pg/mL Final    Urinalysis    Component Value Date/Time   COLORURINE YELLOW 07/17/2013 2240   APPEARANCEUR CLOUDY* 07/17/2013 2240   LABSPEC 1.010 07/17/2013 2240   PHURINE 5.5 07/17/2013 2240   GLUCOSEU NEGATIVE 07/17/2013 2240   HGBUR LARGE* 07/17/2013 2240   BILIRUBINUR NEGATIVE 07/17/2013 2240   KETONESUR NEGATIVE 07/17/2013 2240   PROTEINUR NEGATIVE 07/17/2013 2240   UROBILINOGEN 0.2 07/17/2013 2240   NITRITE NEGATIVE 07/17/2013 2240   LEUKOCYTESUR MODERATE* 07/17/2013 2240      _0 : MM Digital Screening  Unilat R Status: Final result            Study Result    CLINICAL DATA: Screening.  EXAM:  DIGITAL SCREENING UNILATERAL RIGHT MAMMOGRAM WITH CAD  COMPARISON: Previous exam(s).  ACR Breast Density Category b: There are scattered areas of  fibroglandular density.  FINDINGS:  There are no findings suspicious for malignancy. Images were  processed with CAD.  IMPRESSION:  No mammographic evidence of malignancy. A result letter of this  screening mammogram will be mailed directly to the patient.  RECOMMENDATION:  Screening mammogram in one year. (Code:SM-B-01Y)  BI-RADS CATEGORY 1: Negative  Electronically Signed  By: Everlean Alstrom M.D.  On: 02/24/2013 12     PATHOLOGY: Peripheral smear failed to reveal evidence of premature forms.   IMPRESSION:  #1. Microcytic anemia without evidence of overt blood loss. Possibilities include iron deficiency as well as thalassemia trait.  #2. Ischemic cardiomyopathy with diastolic heart failure, compensated. #3. History of left breast cancer in 1992, status post left modified radical mastectomy followed by 5 years of tamoxifen, no evidence of disease, awaiting today's additional lab tests. #4. Recent episode of pneumonia, resolved. #5. Hypertension, controlled. #6.BiVICD, functioning well. #7. Hearing deficit.   PLAN:  #1. The patient was reassured. #2. Additional lab tests were done today to aid in the elucidation of anemia. #3. Followup in 2 weeks with CBC.  I appreciate the opportunity of sharing in her care.    Farrel Gobble, MD 08/19/2013 10:41 AM   DISCLAIMER:  This note was dictated with voice recognition softwre.  Similar sounding words can inadvertently be transcribed inaccurately and may not be corrected upon review.

## 2013-08-19 NOTE — Patient Instructions (Signed)
[facility] Discharge Instructions  RECOMMENDATIONS MADE BY THE CONSULTANT AND ANY TEST RESULTS WILL BE SENT TO YOUR REFERRING PHYSICIAN.  We will see you in 2 weeks for a doctor's visit and repeat lab work. Please call for any questions or concerns.   Thank you for choosing [facility] to provide your oncology and hematology care.  To afford each patient quality time with our providers, please arrive at least 15 minutes before your scheduled appointment time.  With your help, our goal is to use those 15 minutes to complete the necessary work-up to ensure our physicians have the information they need to help with your evaluation and healthcare recommendations.    Effective January 1st, 2014, we ask that you re-schedule your appointment with our physicians should you arrive 10 or more minutes late for your appointment.  We strive to give you quality time with our providers, and arriving late affects you and other patients whose appointments are after yours.    Again, thank you for choosing [facility].  Our hope is that these requests will decrease the amount of time that you wait before being seen by our physicians.       _____________________________________________________________  Should you have questions after your visit to [facility], please contact our office at [phone] between the hours of 8:30 a.m. and 5:00 p.m.  Voicemails left after 4:30 p.m. will not be returned until the following business day.  For prescription refill requests, have your pharmacy contact our office with your prescription refill request.

## 2013-08-20 ENCOUNTER — Other Ambulatory Visit (HOSPITAL_COMMUNITY): Payer: Self-pay | Admitting: Hematology and Oncology

## 2013-08-20 DIAGNOSIS — D509 Iron deficiency anemia, unspecified: Secondary | ICD-10-CM | POA: Insufficient documentation

## 2013-08-21 LAB — HEMOGLOBINOPATHY EVALUATION
HGB A2 QUANT: 2.5 % (ref 2.2–3.2)
HGB F QUANT: 0 % (ref 0.0–2.0)
Hemoglobin Other: 0 %
Hgb A: 97.5 % (ref 96.8–97.8)
Hgb S Quant: 0 %

## 2013-08-24 ENCOUNTER — Telehealth (HOSPITAL_COMMUNITY): Payer: Self-pay | Admitting: Hematology and Oncology

## 2013-08-24 LAB — PROTEIN ELECTROPHORESIS, SERUM
ALPHA-1-GLOBULIN: 7.6 % — AB (ref 2.9–4.9)
Albumin ELP: 42.5 % — ABNORMAL LOW (ref 55.8–66.1)
Alpha-2-Globulin: 17.6 % — ABNORMAL HIGH (ref 7.1–11.8)
Beta 2: 6.1 % (ref 3.2–6.5)
Beta Globulin: 4.7 % (ref 4.7–7.2)
GAMMA GLOBULIN: 21.5 % — AB (ref 11.1–18.8)
M-Spike, %: NOT DETECTED g/dL
TOTAL PROTEIN ELP: 7 g/dL (ref 6.0–8.3)

## 2013-08-24 NOTE — Telephone Encounter (Signed)
PC TO [employer] [phone] ?'D IF CPT Q0138 REQUIRES AUTH PER AUTO CSR AUTH IS NOT REQUIRED REF# [id number]

## 2013-08-31 ENCOUNTER — Encounter: Payer: Self-pay | Admitting: Internal Medicine

## 2013-08-31 ENCOUNTER — Ambulatory Visit (INDEPENDENT_AMBULATORY_CARE_PROVIDER_SITE_OTHER): Payer: Medicare HMO | Admitting: Internal Medicine

## 2013-08-31 VITALS — BP 120/60 | HR 102 | Ht 64.0 in | Wt 139.8 lb

## 2013-08-31 DIAGNOSIS — I428 Other cardiomyopathies: Secondary | ICD-10-CM

## 2013-08-31 DIAGNOSIS — I10 Essential (primary) hypertension: Secondary | ICD-10-CM

## 2013-08-31 DIAGNOSIS — I5022 Chronic systolic (congestive) heart failure: Secondary | ICD-10-CM

## 2013-08-31 DIAGNOSIS — Z95810 Presence of automatic (implantable) cardiac defibrillator: Secondary | ICD-10-CM

## 2013-08-31 LAB — MDC_IDC_ENUM_SESS_TYPE_INCLINIC
Battery Remaining Longevity: 102 mo
Battery Voltage: 3.01 V
Brady Statistic AP VP Percent: 5.9 %
Brady Statistic AP VS Percent: 0.75 %
Brady Statistic AS VP Percent: 91.35 %
Brady Statistic AS VS Percent: 2 %
Brady Statistic RV Percent Paced: 9.45 %
Date Time Interrogation Session: 20150511112710
HighPow Impedance: 209 Ohm
HighPow Impedance: 71 Ohm
Lead Channel Impedance Value: 323 Ohm
Lead Channel Impedance Value: 399 Ohm
Lead Channel Impedance Value: 456 Ohm
Lead Channel Impedance Value: 532 Ohm
Lead Channel Impedance Value: 760 Ohm
Lead Channel Pacing Threshold Amplitude: 0.5 V
Lead Channel Pacing Threshold Amplitude: 0.5 V
Lead Channel Pacing Threshold Pulse Width: 0.5 ms
Lead Channel Sensing Intrinsic Amplitude: 31.625 mV
Lead Channel Sensing Intrinsic Amplitude: 4.25 mV
Lead Channel Setting Pacing Amplitude: 1.5 V
Lead Channel Setting Pacing Amplitude: 1.5 V
Lead Channel Setting Pacing Amplitude: 2.5 V
Lead Channel Setting Pacing Pulse Width: 0.5 ms
Lead Channel Setting Sensing Sensitivity: 0.3 mV
MDC IDC MSMT LEADCHNL RA PACING THRESHOLD PULSEWIDTH: 0.4 ms
MDC IDC MSMT LEADCHNL RV PACING THRESHOLD AMPLITUDE: 1.25 V
MDC IDC MSMT LEADCHNL RV PACING THRESHOLD PULSEWIDTH: 0.4 ms
MDC IDC SET LEADCHNL RV PACING PULSEWIDTH: 0.4 ms
MDC IDC STAT BRADY RA PERCENT PACED: 6.65 %
Zone Setting Detection Interval: 270 ms
Zone Setting Detection Interval: 350 ms
Zone Setting Detection Interval: 360 ms
Zone Setting Detection Interval: 360 ms

## 2013-08-31 MED ORDER — FUROSEMIDE 40 MG PO TABS: 40.0000 mg | ORAL_TABLET | Freq: Two times a day (BID) | ORAL | Status: DC

## 2013-08-31 NOTE — Assessment & Plan Note (Signed)
She has rales on exam and fluid index is up. I have asked her to increase her lasix to 80 mg twice a day for 3 days, then go back to her usual dose.

## 2013-08-31 NOTE — Patient Instructions (Signed)
Your physician recommends that you schedule a follow-up appointment in: 12 months with Dr Knox Saliva will receive a reminder letter two months in advance reminding you to call and schedule your appointment. If you don't receive this letter, please contact our office.  Please Take Lasix  2 tablets twice a day for 3 days, then go back to regular dose.

## 2013-08-31 NOTE — Assessment & Plan Note (Signed)
Her device is working normally. Her fluid index is elevated. Will followup in several months.

## 2013-08-31 NOTE — Progress Notes (Signed)
HPI [name] returns today for followup. She is a very pleasant 73 year old woman with a history of hypertension, chronic systolic heart failure, and left bundle branch block, status post biventricular ICD implantation. She denies any recent ICD shocks. Her heart failure symptoms are improved but she still admits to sodium indiscretion. She denies chest pain, shortness of breath, or syncope. She has mild edema in her left leg.  No Known Allergies   Current Outpatient Prescriptions  Medication Sig Dispense Refill  . carvedilol (COREG) 6.25 MG tablet Take 1 tablet (6.25 mg total) by mouth 2 (two) times daily with a meal.  60 tablet  5  . colchicine 0.6 MG tablet Take 0.6 mg by mouth 2 (two) times daily.      . furosemide (LASIX) 40 MG tablet Take 40 mg by mouth 2 (two) times daily.      [name] ibuprofen (ADVIL,MOTRIN) 200 MG tablet Take 1 tablet (200 mg total) by mouth every 6 (six) hours as needed for fever, headache, mild pain or moderate pain.      [name] levofloxacin (LEVAQUIN) 750 MG tablet Take 1 tablet (750 mg total) by mouth daily.  6 tablet  0  . lisinopril (PRINIVIL,ZESTRIL) 10 MG tablet Take 1 tablet (10 mg total) by mouth daily.  30 tablet  0  . lovastatin (MEVACOR) 40 MG tablet Take 1 tablet by mouth at bedtime.      [name] omeprazole (PRILOSEC) 20 MG capsule Take 20 mg by mouth daily. For gas/heartburn      . oseltamivir (TAMIFLU) 75 MG capsule Take 1 capsule (75 mg total) by mouth as needed. Take one tablet tonight and one tomorrow morning  2 capsule  0  . potassium chloride (K-DUR) 10 MEQ tablet Take 1 tablet (10 mEq total) by mouth 2 (two) times daily.  60 tablet  0   No current facility-administered medications for this visit.     Past Medical History  Diagnosis Date  . Arteriosclerotic cardiovascular disease (ASCVD)     Remote PTCA by patient report; LBBB; associated cardiomyopathy, presumed ischemic with EF 40-45% previously, 20% in 06/2009; h/o clinical congestive heart failure;  negative stress nuclear in 2009 with inferoseptal and apical scar  . Anemia     Hgb of 9-10  . Hyperlipidemia   . Breast cancer   . Hypertension   . Automatic implantable cardioverter-defibrillator in situ   . HOH (hard of hearing)     ROS:   All systems reviewed and negative except as noted in the HPI.   Past Surgical History  Procedure Laterality Date  . Total knee arthroplasty Right     Dr.Harrison  . Bi-ventricular implantable cardioverter defibrillator  (crt-d)  07/28/2012  . Cataract extraction w/ intraocular lens implant Left   . Tubal ligation    . Breast biopsy Bilateral   . Mastectomy Left 1998     Family History  Problem Relation Age of Onset  . Diabetes Father   . Cancer Father   . Hypertension Brother      History   Social History  . Marital Status: Married    Spouse Name: N/A    Number of Children: N/A  . Years of Education: N/A   Occupational History  . Retired    Social History Main Topics  . Smoking status: Former Smoker    Types: Cigarettes  . Smokeless tobacco: Current User    Types: Chew     Comment: 07/03/2013 "smoked some; don't know how much or how  long or when I quit"  . Alcohol Use: No  . Drug Use: No  . Sexual Activity: Not Currently   Other Topics Concern  . Not on file   Social History Narrative  . No narrative on file     BP 120/60  Pulse 102  Ht 5\' 4"  (1.626 m)  Wt 139 lb 12.8 oz (63.413 kg)  BMI 23.98 kg/m2  Physical Exam:  Stable appearing  73 year old woman,NAD HEENT: Unremarkable Neck:  8 cm JVD, no thyromegally Back:  No CVA tenderness Lungs:  Clear with no wheezes, rales, or rhonchi. HEART:  Regular rate rhythm, 2/6 systolic murmur c/w MR, no rubs, no clicks Abd:  soft, positive bowel sounds, no organomegally, no rebound, no guarding Ext:  2 plus pulses, trace edema left leg, no cyanosis, no clubbing Skin:  No rashes no nodules Neuro:  CN II through XII intact, motor grossly intact   DEVICE  Normal  device function.  See PaceArt for details.   Assess/Plan:

## 2013-08-31 NOTE — Assessment & Plan Note (Signed)
Her blood pressure is well controlled. Will follow.  

## 2013-09-02 ENCOUNTER — Encounter (HOSPITAL_BASED_OUTPATIENT_CLINIC_OR_DEPARTMENT_OTHER): Payer: Medicare HMO

## 2013-09-02 ENCOUNTER — Encounter (HOSPITAL_COMMUNITY): Payer: Medicare HMO | Attending: Hematology and Oncology

## 2013-09-02 ENCOUNTER — Encounter (HOSPITAL_COMMUNITY): Payer: Self-pay

## 2013-09-02 VITALS — BP 131/63 | HR 93 | Temp 98.4°F | Resp 20 | Wt 135.5 lb

## 2013-09-02 VITALS — BP 110/43 | HR 73 | Temp 98.4°F | Resp 16

## 2013-09-02 DIAGNOSIS — D649 Anemia, unspecified: Secondary | ICD-10-CM

## 2013-09-02 DIAGNOSIS — D509 Iron deficiency anemia, unspecified: Secondary | ICD-10-CM

## 2013-09-02 DIAGNOSIS — C50919 Malignant neoplasm of unspecified site of unspecified female breast: Secondary | ICD-10-CM | POA: Insufficient documentation

## 2013-09-02 DIAGNOSIS — Z853 Personal history of malignant neoplasm of breast: Secondary | ICD-10-CM

## 2013-09-02 DIAGNOSIS — M069 Rheumatoid arthritis, unspecified: Secondary | ICD-10-CM | POA: Insufficient documentation

## 2013-09-02 LAB — CBC WITH DIFFERENTIAL/PLATELET
Basophils Absolute: 0 10*3/uL (ref 0.0–0.1)
Basophils Relative: 0 % (ref 0–1)
EOS PCT: 1 % (ref 0–5)
Eosinophils Absolute: 0.1 10*3/uL (ref 0.0–0.7)
HCT: 28 % — ABNORMAL LOW (ref 36.0–46.0)
Hemoglobin: 8.5 g/dL — ABNORMAL LOW (ref 12.0–15.0)
LYMPHS ABS: 1.6 10*3/uL (ref 0.7–4.0)
LYMPHS PCT: 23 % (ref 12–46)
MCH: 21.3 pg — ABNORMAL LOW (ref 26.0–34.0)
MCHC: 30.4 g/dL (ref 30.0–36.0)
MCV: 70 fL — AB (ref 78.0–100.0)
Monocytes Absolute: 0.7 10*3/uL (ref 0.1–1.0)
Monocytes Relative: 10 % (ref 3–12)
NEUTROS ABS: 4.8 10*3/uL (ref 1.7–7.7)
Neutrophils Relative %: 66 % (ref 43–77)
PLATELETS: 381 10*3/uL (ref 150–400)
RBC: 4 MIL/uL (ref 3.87–5.11)
RDW: 20.7 % — ABNORMAL HIGH (ref 11.5–15.5)
WBC: 7.3 10*3/uL (ref 4.0–10.5)

## 2013-09-02 MED ORDER — SODIUM CHLORIDE 0.9 % IJ SOLN
10.0000 mL | INTRAMUSCULAR | Status: DC | PRN
  Administered 2013-09-02: 10 mL

## 2013-09-02 MED ORDER — SODIUM CHLORIDE 0.9 % IV SOLN
Freq: Once | INTRAVENOUS | Status: AC
  Administered 2013-09-02: 10:00:00 via INTRAVENOUS

## 2013-09-02 MED ORDER — SODIUM CHLORIDE 0.9 % IV SOLN
1020.0000 mg | Freq: Once | INTRAVENOUS | Status: AC
  Administered 2013-09-02: 1020 mg via INTRAVENOUS
  Filled 2013-09-02: qty 34

## 2013-09-02 NOTE — Progress Notes (Signed)
[facility]  OFFICE PROGRESS Savannah Ewing, MD [address] [place] [address]  DIAGNOSIS: Iron deficiency anemia  Rheumatoid arthritis  Breast cancer  Chief Complaint  Patient presents with  . Iron deficiency    CURRENT THERAPY: To receive intravenous iron today.  INTERVAL HISTORY: [name] 73 y.o. female returns for followup and for consideration of intravenous iron for iron deficiency anemia a remote history of breast cancer treated with left modified radical mastectomy in 1992 followed by 5 years of tamoxifen never having received radiotherapy. She still feels short of breath on exertion but without orthopnea or PND. She denies any chest pain, nausea, vomiting, lower extremity swelling or redness, or worsening joint discomfort.  MEDICAL HISTORY: Past Medical History  Diagnosis Date  . Arteriosclerotic cardiovascular disease (ASCVD)     Remote PTCA by patient report; LBBB; associated cardiomyopathy, presumed ischemic with EF 40-45% previously, 20% in 06/2009; h/o clinical congestive heart failure; negative stress nuclear in 2009 with inferoseptal and apical scar  . Anemia     Hgb of 9-10  . Hyperlipidemia   . Breast cancer   . Hypertension   . Automatic implantable cardioverter-defibrillator in situ   . HOH (hard of hearing)     INTERIM HISTORY: has HYPERLIPIDEMIA; Chronic systolic heart failure; Anemia; Breast cancer; Hypertension; Biventricular ICD (implantable cardioverter-defibrillator) in place; Hypokalemia; Pain and swelling of wrist; Chest pain; Rheumatoid arthritis; HCAP (healthcare-associated pneumonia); Sepsis; ARF (acute renal failure); PNA (pneumonia); Malnutrition of moderate degree; and Iron deficiency anemia on her problem list.    ALLERGIES:  has No Known Allergies.  MEDICATIONS: has a current medication list which includes the following prescription(s): acetaminophen, carvedilol,  colchicine, furosemide, lisinopril, lovastatin, omeprazole, potassium chloride, ibuprofen, levofloxacin, and oseltamivir, and the following Facility-Administered Medications: ferumoxytol and sodium chloride.  SURGICAL HISTORY:  Past Surgical History  Procedure Laterality Date  . Total knee arthroplasty Right     Dr.Harrison  . Bi-ventricular implantable cardioverter defibrillator  (crt-d)  07/28/2012  . Cataract extraction w/ intraocular lens implant Left   . Tubal ligation    . Breast biopsy Bilateral   . Mastectomy Left 1998    FAMILY HISTORY: family history includes Cancer in her father; Diabetes in her father; Hypertension in her brother.  SOCIAL HISTORY:  reports that she has quit smoking. Her smoking use included Cigarettes. She smoked 0.00 packs per day. Her smokeless tobacco use includes Chew. She reports that she does not drink alcohol or use illicit drugs.  REVIEW OF SYSTEMS:  Other than that discussed above is noncontributory.  PHYSICAL EXAMINATION: ECOG PERFORMANCE STATUS: 1 - Symptomatic but completely ambulatory  Blood pressure 131/63, pulse 93, temperature 98.4 F (36.9 C), temperature source Oral, resp. rate 20, weight 135 lb 8 oz (61.462 kg).  GENERAL:alert, no distress and comfortable SKIN: skin color, texture, turgor are normal, no rashes or significant lesions EYES: PERLA; Conjunctiva are pink and non-injected, sclera clear SINUSES: No redness or tenderness over maxillary or ethmoid sinuses OROPHARYNX:no exudate, no erythema on lips, buccal mucosa, or tongue. NECK: supple, thyroid normal size, non-tender, without nodularity. No masses CHEST: Status post left mastectomy with no subcutaneous nodules. Pacemaker in place. Right breast without mass. LYMPH:  no palpable lymphadenopathy in the cervical, axillary or inguinal LUNGS: clear to auscultation and percussion with normal breathing effort HEART: regular rate & rhythm and no murmurs. ABDOMEN:abdomen soft,  non-tender and normal bowel sounds MUSCULOSKELETAL:no cyanosis of digits and  no clubbing. Range of motion normal.  NEURO: alert & oriented x 3 with fluent speech, no focal motor/sensory deficits   LABORATORY DATA:  08/19/2013: Ferritin 30  Office Visit on 08/31/2013  Component Date Value Ref Range Status  . Date Time Interrogation Session 08/31/2013 [id number]   Final  . Pulse Generator Manufacturer 08/31/2013 Medtronic   Final  . Pulse Gen Model 08/31/2013 [id number] Viva XT CRT-D   Final  . Pulse Gen Serial Number 08/31/2013 [id number]   Final  . RV Sense Sensitivity 08/31/2013 0.3   Final  . RA Pace Amplitude 08/31/2013 1.5   Final  . RV Pace PulseWidth 08/31/2013 0.4   Final  . RV Pace Amplitude 08/31/2013 2.5   Final  . LV Pace PulseWidth 08/31/2013 0.5   Final  . LV Pace Amplitude 08/31/2013 1.5   Final  . LV Pace Capture Mode 08/31/2013 Adaptive Capture   Final  . Zone Setting Type Category 08/31/2013 VF   Final  . Zone Detect Interval 08/31/2013 270   Final  . Zone Setting Type Category 08/31/2013 VT   Final  . Zone Detect Interval 08/31/2013 Blank   Final  . Zone Setting Type Category 08/31/2013 VENTRICULAR_TACHYCARDIA_1   Final  . Zone Detect Interval 08/31/2013 360   Final  . Zone Setting Type Category 08/31/2013 VENTRICULAR_TACHYCARDIA_2   Final  . Zone Detect Interval 08/31/2013 360   Final  . Zone Setting Type Category 08/31/2013 ATRIAL_FIBRILLATION   Final  . Zone Setting Type Category 08/31/2013 ATAF   Final  . Zone Detect Interval 08/31/2013 350   Final  . RA Impedance 08/31/2013 399   Final  . RA Amplitude 08/31/2013 4.25   Final  . RA Pacing Amplitude 08/31/2013 0.5   Final  . RA Pacing PulseWidth 08/31/2013 0.4   Final  . RV IMPEDANCE 08/31/2013 456   Final  . RV Amplitude 08/31/2013 31.625   Final  . RV Pacing Amplitude 08/31/2013 1.25   Final  . RV Pacing PulseWidth 08/31/2013 0.4   Final  . HighPow Impedance 08/31/2013 209   Final  . HighPow Impedance  08/31/2013 71   Final  . LV Impedance 08/31/2013 760   Final  . LV Impedance 08/31/2013 323   Final  . LV Impedance 08/31/2013 532   Final  . LV Pacing Amplitude 08/31/2013 0.5   Final  . LV PACING PULSEWIDTH 08/31/2013 0.5   Final  . Battery Status 08/31/2013 OK   Final  . Battery Longevity 08/31/2013 102   Final  . Battery Voltage 08/31/2013 3.01   Final  . Loletha Grayer RA Perc Paced 08/31/2013 6.65   Final  . Loletha Grayer RV Perc Paced 08/31/2013 9.45   Final  . Loletha Grayer AP VP Percent 08/31/2013 5.90   Final  . Loletha Grayer AS VP Percent 08/31/2013 91.35   Final  . Loletha Grayer AP VS Percent 08/31/2013 0.75   Final  . Loletha Grayer AS VS Percent 08/31/2013 2.00   Final  . Eval Rhythm 08/31/2013 SR_0    Final  . Miscellaneous Comment 08/31/2013    Final                   Value:CRT-D device check in office. Thresholds and sensing consistent with previous device measurements. Lead impedance trends stable over time. No mode switch episodes recorded. No ventricular arrhythmia episodes recorded. Patient bi-ventricularly pacing                          >  96.2 % of the time. Device programmed with appropriate safety margins. Heart failure diagnostics reviewed.  Optivol and thoracic impedance abnormal 5/10 ongoing.   Audible/vibratory alerts demonstrated for patient. No changes made this session.                          Estimated longevity 8.5 years.  Patient enrolled in remote follow up. Plan to check device remotely in 3 months and see in office in 6 months. Patient education completed including shock plan.  Carelink 12/02/13.  Infusion on 08/19/2013  Component Date Value Ref Range Status  . CA 27.29 08/19/2013 37  0 - 39 U/mL Final   Performed at [facility]  . WBC 08/19/2013 8.2  4.0 - 10.5 K/uL Final  . RBC 08/19/2013 4.03  3.87 - 5.11 MIL/uL Final  . Hemoglobin 08/19/2013 8.8* 12.0 - 15.0 g/dL Final  . HCT 08/19/2013 28.5* 36.0 - 46.0 % Final  . MCV 08/19/2013 70.7* 78.0 - 100.0 fL Final  . MCH 08/19/2013 21.8* 26.0 -  34.0 pg Final  . MCHC 08/19/2013 30.9  30.0 - 36.0 g/dL Final  . RDW 08/19/2013 21.3* 11.5 - 15.5 % Final  . Platelets 08/19/2013 338  150 - 400 K/uL Final  . Neutrophils Relative % 08/19/2013 63  43 - 77 % Final  . Neutro Abs 08/19/2013 5.1  1.7 - 7.7 K/uL Final  . Lymphocytes Relative 08/19/2013 23  12 - 46 % Final  . Lymphs Abs 08/19/2013 1.9  0.7 - 4.0 K/uL Final  . Monocytes Relative 08/19/2013 14* 3 - 12 % Final  . Monocytes Absolute 08/19/2013 1.1* 0.1 - 1.0 K/uL Final  . Eosinophils Relative 08/19/2013 1  0 - 5 % Final  . Eosinophils Absolute 08/19/2013 0.1  0.0 - 0.7 K/uL Final  . Basophils Relative 08/19/2013 0  0 - 1 % Final  . Basophils Absolute 08/19/2013 0.0  0.0 - 0.1 K/uL Final  . CEA 08/19/2013 4.2  0.0 - 5.0 ng/mL Final   Performed at [facility]  . Ferritin 08/19/2013 30  10 - 291 ng/mL Final   Performed at [facility]  . Sodium 08/19/2013 143  137 - 147 mEq/L Final  . Potassium 08/19/2013 3.2* 3.7 - 5.3 mEq/L Final  . Chloride 08/19/2013 101  96 - 112 mEq/L Final  . CO2 08/19/2013 27  19 - 32 mEq/L Final  . Glucose, Bld 08/19/2013 109* 70 - 99 mg/dL Final  . BUN 08/19/2013 18  6 - 23 mg/dL Final  . Creatinine, Ser 08/19/2013 0.83  0.50 - 1.10 mg/dL Final  . Calcium 08/19/2013 9.1  8.4 - 10.5 mg/dL Final  . Total Protein 08/19/2013 7.7  6.0 - 8.3 g/dL Final  . Albumin 08/19/2013 2.8* 3.5 - 5.2 g/dL Final  . AST 08/19/2013 17  0 - 37 U/L Final  . ALT 08/19/2013 10  0 - 35 U/L Final  . Alkaline Phosphatase 08/19/2013 103  39 - 117 U/L Final  . Total Bilirubin 08/19/2013 0.2* 0.3 - 1.2 mg/dL Final  . GFR calc non Af Amer 08/19/2013 68* >90 mL/min Final  . GFR calc Af Amer 08/19/2013 79* >90 mL/min Final   Comment: (NOTE)                          The eGFR has been calculated using the CKD EPI equation.  This calculation has not been validated in all clinical situations.                          eGFR's persistently <90  mL/min signify possible Chronic Kidney                          Disease.  . Hgb A2 Quant 08/19/2013 2.5  2.2 - 3.2 % Final   Comment: (NOTE)                          Patients with the combination of iron deficiency and B-thalassemia may                          clinically present with normal A2 level.  An elevated A2 level can not                          be used to screen for beta-thalassemia in these cases.  . Hgb F Quant 08/19/2013 0.0  0.0 - 2.0 % Final  . Hgb S Quant 08/19/2013 0.0  0.0 % Final  . Hgb A 08/19/2013 97.5  96.8 - 97.8 % Final  . Hemoglobin Other 08/19/2013 0.0  0.0 % Final   Comment: (NOTE)                          Interpretation                          --------------                          Normal study.                          Reviewed by Odis Hollingshead, MD, PhD, FCAP (Electronic Signature on                          File)                          Performed at [facility]  . Iron 08/19/2013 20* 42 - 135 ug/dL Final  . TIBC 08/19/2013 222* 250 - 470 ug/dL Final  . Saturation Ratios 08/19/2013 9* 20 - 55 % Final  . UIBC 08/19/2013 202  125 - 400 ug/dL Final   Performed at [facility]  . LDH 08/19/2013 194  94 - 250 U/L Final  . Total Protein ELP 08/19/2013 7.0  6.0 - 8.3 g/dL Final  . Albumin ELP 08/19/2013 42.5* 55.8 - 66.1 % Final  . Alpha-1-Globulin 08/19/2013 7.6* 2.9 - 4.9 % Final  . Alpha-2-Globulin 08/19/2013 17.6* 7.1 - 11.8 % Final  . Beta Globulin 08/19/2013 4.7  4.7 - 7.2 % Final  . Beta 2 08/19/2013 6.1  3.2 - 6.5 % Final  . Gamma Globulin 08/19/2013 21.5* 11.1 - 18.8 % Final  . M-Spike, % 08/19/2013 NOT DETECTED   Final  . SPE Interp. 08/19/2013 (NOTE)   Final   Comment: Nonspecific diffuse polyclonal type increase in gamma globulins.  Results are consistent with SPE performed on 07/17/2013  Reviewed by                          Odis Hollingshead, MD, PhD, FCAP (Electronic Signature on File)  . Comment  08/19/2013 (NOTE)   Final   Comment: ---------------                          Serum protein electrophoresis is a useful screening procedure in the                          detection of various pathophysiologic states such as inflammation,                          gammopathies, protein loss and other dysproteinemias.  Immunofixation                          electrophoresis (IFE) is a more sensitive technique for the                          identification of M-proteins found in patients with monoclonal                          gammopathy of unknown significance (MGUS), amyloidosis, early or                          treated myeloma or macroglobulinemia, solitary plasmacytoma or                          extramedullary plasmacytoma.                          Performed at [facility]  . Retic Ct Pct 08/19/2013 1.5  0.4 - 3.1 % Final  . RBC. 08/19/2013 4.03  3.87 - 5.11 MIL/uL Final  . Retic Count, Manual 08/19/2013 60.5  19.0 - 186.0 K/uL Final    PATHOLOGY: No new pathology.  Urinalysis    Component Value Date/Time   COLORURINE YELLOW 07/17/2013 2240   APPEARANCEUR CLOUDY* 07/17/2013 2240   LABSPEC 1.010 07/17/2013 2240   PHURINE 5.5 07/17/2013 2240   GLUCOSEU NEGATIVE 07/17/2013 2240   HGBUR LARGE* 07/17/2013 2240   BILIRUBINUR NEGATIVE 07/17/2013 2240   KETONESUR NEGATIVE 07/17/2013 2240   PROTEINUR NEGATIVE 07/17/2013 2240   UROBILINOGEN 0.2 07/17/2013 2240   NITRITE NEGATIVE 07/17/2013 2240   LEUKOCYTESUR MODERATE* 07/17/2013 2240    RADIOGRAPHIC STUDIES: No results found.  ASSESSMENT:  #1. Iron deficiency anemia. #2. Ischemic cardiomyopathy with diastolic heart failure, compensated. #3. History of left breast cancer 1992, status post left modified radical mastectomy followed by 5 years of tamoxifen, no evidence of disease. #4. Recent episode of pneumonia, resolved. #5. Hypertension, controlled. #6..BiVICD, functioning well. #7. Hearing deficit.   PLAN:  #1. Intravenous  Feraheme 1020 mg. #2. Followup in 3 months with CBC and ferritin.   All questions were answered. The patient knows to call the clinic with any problems, questions or concerns. We can certainly see the patient much sooner if necessary.   I spent 25 minutes counseling the patient face to face. The total time spent in the  appointment was 30 minutes.    Farrel Gobble, MD 09/02/2013 9:55 AM  DISCLAIMER:  This note was dictated with voice recognition software.  Similar sounding words can inadvertently be transcribed inaccurately and may not be corrected upon review.

## 2013-09-02 NOTE — Progress Notes (Signed)
Tolerated well

## 2013-09-02 NOTE — Addendum Note (Signed)
Addended by: Elenor Legato on: 09/02/2013 10:53 AM   Modules accepted: Orders

## 2013-09-02 NOTE — Patient Instructions (Signed)
[facility] Discharge Instructions  RECOMMENDATIONS MADE BY THE CONSULTANT AND ANY TEST RESULTS WILL BE SENT TO YOUR REFERRING PHYSICIAN.  EXAM FINDINGS BY THE PHYSICIAN TODAY AND SIGNS OR SYMPTOMS TO REPORT TO CLINIC OR PRIMARY PHYSICIAN: Dr Barnet Glasgow  followup in 3 months with the doctor and labs      Thank you for choosing [facility] to provide your oncology and hematology care.  To afford each patient quality time with our providers, please arrive at least 15 minutes before your scheduled appointment time.  With your help, our goal is to use those 15 minutes to complete the necessary work-up to ensure our physicians have the information they need to help with your evaluation and healthcare recommendations.    Effective January 1st, 2014, we ask that you re-schedule your appointment with our physicians should you arrive 10 or more minutes late for your appointment.  We strive to give you quality time with our providers, and arriving late affects you and other patients whose appointments are after yours.    Again, thank you for choosing [facility].  Our hope is that these requests will decrease the amount of time that you wait before being seen by our physicians.       _____________________________________________________________  Should you have questions after your visit to [facility], please contact our office at [phone] between the hours of 8:30 a.m. and 5:00 p.m.  Voicemails left after 4:30 p.m. will not be returned until the following business day.  For prescription refill requests, have your pharmacy contact our office with your prescription refill request.

## 2013-09-04 ENCOUNTER — Other Ambulatory Visit (HOSPITAL_COMMUNITY): Payer: Self-pay | Admitting: Internal Medicine

## 2013-09-08 ENCOUNTER — Encounter (HOSPITAL_COMMUNITY): Payer: Self-pay | Admitting: Pharmacy Technician

## 2013-09-08 NOTE — Patient Instructions (Signed)
Your procedure is scheduled on: 09/15/2013  Report to [facility] at  1000  AM.  Call this number if you have problems the morning of surgery: [phone]   Do not eat food or drink liquids :After Midnight.      Take these medicines the morning of surgery with A SIP OF WATER: carvedilol, cochicine, lisinopril, prilosec.   Do not wear jewelry, make-up or nail polish.  Do not wear lotions, powders, or perfumes.   Do not shave 48 hours prior to surgery.  Do not bring valuables to the hospital.  Contacts, dentures or bridgework may not be worn into surgery.  Leave suitcase in the car. After surgery it may be brought to your room.  For patients admitted to the hospital, checkout time is 11:00 AM the day of discharge.   Patients discharged the day of surgery will not be allowed to drive home.  :     Please read over the following fact sheets that you were given: Coughing and Deep Breathing, Surgical Site Infection Prevention, Anesthesia Post-op Instructions and Care and Recovery After Surgery    Cataract A cataract is a clouding of the lens of the eye. When a lens becomes cloudy, vision is reduced based on the degree and nature of the clouding. Many cataracts reduce vision to some degree. Some cataracts make people more near-sighted as they develop. Other cataracts increase glare. Cataracts that are ignored and become worse can sometimes look white. The white color can be seen through the pupil. CAUSES   Aging. However, cataracts may occur at any age, even in newborns.   Certain drugs.   Trauma to the eye.   Certain diseases such as diabetes.   Specific eye diseases such as chronic inflammation inside the eye or a sudden attack of a rare form of glaucoma.   Inherited or acquired medical problems.  SYMPTOMS   Gradual, progressive drop in vision in the affected eye.   Severe, rapid visual loss. This most often happens when trauma is the cause.  DIAGNOSIS  To detect a cataract, an eye  doctor examines the lens. Cataracts are best diagnosed with an exam of the eyes with the pupils enlarged (dilated) by drops.  TREATMENT  For an early cataract, vision may improve by using different eyeglasses or stronger lighting. If that does not help your vision, surgery is the only effective treatment. A cataract needs to be surgically removed when vision loss interferes with your everyday activities, such as driving, reading, or watching TV. A cataract may also have to be removed if it prevents examination or treatment of another eye problem. Surgery removes the cloudy lens and usually replaces it with a substitute lens (intraocular lens, IOL).  At a time when both you and your doctor agree, the cataract will be surgically removed. If you have cataracts in both eyes, only one is usually removed at a time. This allows the operated eye to heal and be out of danger from any possible problems after surgery (such as infection or poor wound healing). In rare cases, a cataract may be doing damage to your eye. In these cases, your caregiver may advise surgical removal right away. The vast majority of people who have cataract surgery have better vision afterward. HOME CARE INSTRUCTIONS  If you are not planning surgery, you may be asked to do the following:  Use different eyeglasses.   Use stronger or brighter lighting.   Ask your eye doctor about reducing your medicine dose or  changing medicines if it is thought that a medicine caused your cataract. Changing medicines does not make the cataract go away on its own.   Become familiar with your surroundings. Poor vision can lead to injury. Avoid bumping into things on the affected side. You are at a higher risk for tripping or falling.   Exercise extreme care when driving or operating machinery.   Wear sunglasses if you are sensitive to bright light or experiencing problems with glare.  SEEK IMMEDIATE MEDICAL CARE IF:   You have a worsening or sudden  vision loss.   You notice redness, swelling, or increasing pain in the eye.   You have a fever.  Document Released: 04/09/2005 Document Revised: 03/29/2011 Document Reviewed: 12/01/2010 [employer] Patient Information 2012 [place].PATIENT INSTRUCTIONS POST-ANESTHESIA  IMMEDIATELY FOLLOWING SURGERY:  Do not drive or operate machinery for the first twenty four hours after surgery.  Do not make any important decisions for twenty four hours after surgery or while taking narcotic pain medications or sedatives.  If you develop intractable nausea and vomiting or a severe headache please notify your doctor immediately.  FOLLOW-UP:  Please make an appointment with your surgeon as instructed. You do not need to follow up with anesthesia unless specifically instructed to do so.  WOUND CARE INSTRUCTIONS (if applicable):  Keep a dry clean dressing on the anesthesia/puncture wound site if there is drainage.  Once the wound has quit draining you may leave it open to air.  Generally you should leave the bandage intact for twenty four hours unless there is drainage.  If the epidural site drains for more than 36-48 hours please call the anesthesia department.  QUESTIONS?:  Please feel free to call your physician or the hospital operator if you have any questions, and they will be happy to assist you.

## 2013-09-09 ENCOUNTER — Encounter (HOSPITAL_COMMUNITY): Payer: Self-pay

## 2013-09-09 ENCOUNTER — Encounter (HOSPITAL_COMMUNITY)
Admission: RE | Admit: 2013-09-09 | Discharge: 2013-09-09 | Disposition: A | Discharge status: Home / Self Care | Payer: Medicare HMO | Source: Ambulatory Visit | Attending: Ophthalmology | Admitting: Ophthalmology

## 2013-09-09 DIAGNOSIS — Z01812 Encounter for preprocedural laboratory examination: Secondary | ICD-10-CM | POA: Insufficient documentation

## 2013-09-09 LAB — BASIC METABOLIC PANEL
BUN: 23 mg/dL (ref 6–23)
CHLORIDE: 101 meq/L (ref 96–112)
CO2: 26 mEq/L (ref 19–32)
Calcium: 9.4 mg/dL (ref 8.4–10.5)
Creatinine, Ser: 0.94 mg/dL (ref 0.50–1.10)
GFR calc Af Amer: 68 mL/min — ABNORMAL LOW (ref >90)
GFR calc non Af Amer: 58 mL/min — ABNORMAL LOW (ref >90)
Glucose, Bld: 114 mg/dL — ABNORMAL HIGH (ref 70–99)
Potassium: 3.9 mEq/L (ref 3.7–5.3)
SODIUM: 142 meq/L (ref 137–147)

## 2013-09-09 LAB — SURGICAL PCR SCREEN
MRSA, PCR: NEGATIVE
STAPHYLOCOCCUS AUREUS: NEGATIVE

## 2013-09-09 LAB — HEMOGLOBIN AND HEMATOCRIT, BLOOD
HEMATOCRIT: 28.7 % — AB (ref 36.0–46.0)
Hemoglobin: 8.7 g/dL — ABNORMAL LOW (ref 12.0–15.0)

## 2013-09-10 NOTE — Pre-Procedure Instructions (Signed)
Dr Revonda Humphrey of H&H and hx of IDA and fact that patient started iron infusions earlier this month. No orders given.

## 2013-09-11 ENCOUNTER — Ambulatory Visit: Payer: Medicare HMO | Admitting: Cardiovascular Disease

## 2013-09-11 ENCOUNTER — Ambulatory Visit (INDEPENDENT_AMBULATORY_CARE_PROVIDER_SITE_OTHER): Payer: Medicare HMO | Admitting: Cardiovascular Disease

## 2013-09-11 VITALS — BP 162/70 | HR 62 | Ht 64.0 in | Wt 136.0 lb

## 2013-09-11 DIAGNOSIS — I509 Heart failure, unspecified: Secondary | ICD-10-CM

## 2013-09-11 DIAGNOSIS — E785 Hyperlipidemia, unspecified: Secondary | ICD-10-CM

## 2013-09-11 DIAGNOSIS — I10 Essential (primary) hypertension: Secondary | ICD-10-CM

## 2013-09-11 DIAGNOSIS — I2589 Other forms of chronic ischemic heart disease: Secondary | ICD-10-CM

## 2013-09-11 DIAGNOSIS — Z9114 Patient's other noncompliance with medication regimen: Secondary | ICD-10-CM

## 2013-09-11 DIAGNOSIS — I7090 Unspecified atherosclerosis: Secondary | ICD-10-CM

## 2013-09-11 DIAGNOSIS — I2510 Atherosclerotic heart disease of native coronary artery without angina pectoris: Secondary | ICD-10-CM

## 2013-09-11 DIAGNOSIS — Z9119 Patient's noncompliance with other medical treatment and regimen: Secondary | ICD-10-CM

## 2013-09-11 DIAGNOSIS — I255 Ischemic cardiomyopathy: Secondary | ICD-10-CM

## 2013-09-11 DIAGNOSIS — I5022 Chronic systolic (congestive) heart failure: Secondary | ICD-10-CM

## 2013-09-11 DIAGNOSIS — Z95810 Presence of automatic (implantable) cardiac defibrillator: Secondary | ICD-10-CM

## 2013-09-11 DIAGNOSIS — Z91199 Patient's noncompliance with other medical treatment and regimen due to unspecified reason: Secondary | ICD-10-CM

## 2013-09-11 LAB — BASIC METABOLIC PANEL
BUN: 19 mg/dL (ref 6–23)
CHLORIDE: 104 meq/L (ref 96–112)
CO2: 27 mEq/L (ref 19–32)
Calcium: 9.3 mg/dL (ref 8.4–10.5)
Creat: 0.76 mg/dL (ref 0.50–1.10)
Glucose, Bld: 87 mg/dL (ref 70–99)
POTASSIUM: 3.8 meq/L (ref 3.5–5.3)
Sodium: 142 mEq/L (ref 135–145)

## 2013-09-11 NOTE — Progress Notes (Signed)
Patient ID: [name], female   DOB: 07/19/1940, 73 y.o.   MRN: [id number]      SUBJECTIVE: [name] is a 73 y.o. female with a history of chronic systolic congestive heart failure, CAD s/p remote MI, ICM with EF 30-35% s/p BiV ICD implantation who was hospitalized in March for influenza and healthcare associated pneumonia. Her device was interrogated during that time and was found to be functioning normally.  She had been hypotensive secondary to sepsis, and amlodipine and spironolactone were withheld at the time of discharge.   An echocardiogram performed in March showed severely reduced left ventricular systolic function, EF 99991111, grade 2 diastolic dysfunction, moderate left atrial enlargement, and mild mitral regurgitation.   She saw Dr. Lovena Le on 08/31/2013 and admitted to sodium indiscretion and was found to have rales and trace leg edema. Her Lasix was increased to 80 mg twice daily for three days and then was instructed to resume her normal dose.  She denies chest pain and shortness of breath. She also denies leg swelling and palpitations.  It appears she has not been taking potassium as instructed.  Wt 136 lbs today, 139 lbs on 5/11, 134 lbs on 4/3.     No Known Allergies  Current Outpatient Prescriptions  Medication Sig Dispense Refill  . acetaminophen (TYLENOL) 500 MG tablet Take 500 mg by mouth every 6 (six) hours as needed.      . carvedilol (COREG) 6.25 MG tablet Take 1 tablet (6.25 mg total) by mouth 2 (two) times daily with a meal.  60 tablet  5  . colchicine 0.6 MG tablet Take 0.6 mg by mouth 2 (two) times daily.      . furosemide (LASIX) 40 MG tablet Take 1 tablet (40 mg total) by mouth 2 (two) times daily.  60 tablet  6  . ibuprofen (ADVIL,MOTRIN) 200 MG tablet Take 1 tablet (200 mg total) by mouth every 6 (six) hours as needed for fever, headache, mild pain or moderate pain.      [name] lisinopril (PRINIVIL,ZESTRIL) 10 MG tablet Take 1 tablet (10 mg total) by  mouth daily.  30 tablet  0  . lovastatin (MEVACOR) 40 MG tablet Take 1 tablet by mouth at bedtime.      [name] omeprazole (PRILOSEC) 20 MG capsule Take 20 mg by mouth daily. For gas/heartburn      . potassium chloride (K-DUR) 10 MEQ tablet Take 1 tablet (10 mEq total) by mouth 2 (two) times daily.  60 tablet  0   No current facility-administered medications for this visit.    Past Medical History  Diagnosis Date  . Arteriosclerotic cardiovascular disease (ASCVD)     Remote PTCA by patient report; LBBB; associated cardiomyopathy, presumed ischemic with EF 40-45% previously, 20% in 06/2009; h/o clinical congestive heart failure; negative stress nuclear in 2009 with inferoseptal and apical scar  . Anemia     Hgb of 9-10  . Hyperlipidemia   . Breast cancer   . Hypertension   . Automatic implantable cardioverter-defibrillator in situ   . HOH (hard of hearing)     Past Surgical History  Procedure Laterality Date  . Total knee arthroplasty Right     Dr.Harrison  . Bi-ventricular implantable cardioverter defibrillator  (crt-d)  07/28/2012  . Cataract extraction w/ intraocular lens implant Left   . Tubal ligation    . Breast biopsy Bilateral   . Mastectomy Left 1998    History   Social History  . Marital Status:  Married    Spouse Name: N/A    Number of Children: N/A  . Years of Education: N/A   Occupational History  . Retired    Social History Main Topics  . Smoking status: Former Smoker    Types: Cigarettes  . Smokeless tobacco: Current User    Types: Chew     Comment: 07/03/2013 "smoked some; don't know how much or how long or when I quit"  . Alcohol Use: No  . Drug Use: No  . Sexual Activity: Not Currently   Other Topics Concern  . Not on file   Social History Narrative  . No narrative on file     Filed Vitals:   09/11/13 0848  BP: 162/70  Pulse: 62  Height: 5\' 4"  (1.626 m)  Weight: 136 lb (61.689 kg)    PHYSICAL EXAM General: NAD  Neck: No JVD, no  thyromegaly.  Lungs: Clear to auscultation bilaterally with normal respiratory effort.  CV: Nondisplaced PMI. Regular rate and rhythm, normal S1/S2, no S3/S4, soft II/VI systolic murmur over RUSB. No pretibial or periankle edema. No carotid bruit. Normal pedal pulses.  Abdomen: Soft, nontender, no hepatosplenomegaly, no distention.  Neurologic: Alert and oriented x 3.  Psych: Normal affect.  Extremities: No clubbing or cyanosis.    ECG: reviewed and available in electronic records.      ASSESSMENT AND PLAN: 1. Ischemic cardiomyopathy/chronic systolic heart failure, EF 30-35%: She appears to be compensated. I will continue Lasix 40 mg twice daily. For the time being, I will withhold starting her back on spironolactone. I will continue carvedilol 6.25 mg twice daily (cannot increase with resting HR 62 bpm) along with lisinopril 40 mg daily. I've again emphasized the importance of a low sodium diet. As she has not been taking potassium, I have encouraged her to do so and I will also check a basic metabolic panel. 2. CAD: Symptomatically stable. Continue aspirin, beta blocker, and lovastatin.  3. Hypertension: Elevated today on current therapy which includes carvedilol and lisinopril. However, she was in a hurry to get here and did not take her medications. I will continue to monitor this. If it remains elevated, I would consider restarting amlodipine 5 mg daily. 4. BiVICD: Most recent nterrogation found it to be functioning normally.   Dispo: f/u 3 months.   Kate Sable, M.D., F.A.C.C.

## 2013-09-11 NOTE — Patient Instructions (Signed)
Your physician recommends that you schedule a follow-up appointment in: 3 months    Your physician recommends that you continue on your current medications as directed. Please refer to the Current Medication list given to you today.      Please get your blood work done today  Artist)      Thank you for choosing [facility] !

## 2013-09-15 ENCOUNTER — Encounter (HOSPITAL_COMMUNITY)
Admission: RE | Disposition: A | Discharge status: Home / Self Care | Payer: Self-pay | Source: Ambulatory Visit | Attending: Ophthalmology

## 2013-09-15 ENCOUNTER — Encounter (HOSPITAL_COMMUNITY): Payer: Medicare HMO | Admitting: Anesthesiology

## 2013-09-15 ENCOUNTER — Ambulatory Visit (HOSPITAL_COMMUNITY): Payer: Medicare HMO | Admitting: Anesthesiology

## 2013-09-15 ENCOUNTER — Encounter (HOSPITAL_COMMUNITY): Payer: Self-pay | Admitting: *Deleted

## 2013-09-15 ENCOUNTER — Ambulatory Visit (HOSPITAL_COMMUNITY)
Admission: RE | Admit: 2013-09-15 | Discharge: 2013-09-15 | Disposition: A | Discharge status: Home / Self Care | Payer: Medicare HMO | Source: Ambulatory Visit | Attending: Ophthalmology | Admitting: Ophthalmology

## 2013-09-15 ENCOUNTER — Encounter: Payer: Self-pay | Admitting: *Deleted

## 2013-09-15 DIAGNOSIS — H2589 Other age-related cataract: Secondary | ICD-10-CM | POA: Insufficient documentation

## 2013-09-15 HISTORY — PX: CATARACT EXTRACTION W/PHACO: SHX586

## 2013-09-15 SURGERY — PHACOEMULSIFICATION, CATARACT, WITH IOL INSERTION
Anesthesia: Monitor Anesthesia Care | Site: Eye | Laterality: Right

## 2013-09-15 MED ORDER — KETOROLAC TROMETHAMINE 0.5 % OP SOLN
OPHTHALMIC | Status: AC
  Filled 2013-09-15: qty 5

## 2013-09-15 MED ORDER — CYCLOPENTOLATE-PHENYLEPHRINE OP SOLN OPTIME - NO CHARGE
OPHTHALMIC | Status: AC
  Filled 2013-09-15: qty 2

## 2013-09-15 MED ORDER — MIDAZOLAM HCL 2 MG/2ML IJ SOLN
1.0000 mg | INTRAMUSCULAR | Status: DC | PRN
  Administered 2013-09-15: 2 mg via INTRAVENOUS

## 2013-09-15 MED ORDER — BSS IO SOLN
INTRAOCULAR | Status: DC | PRN
  Administered 2013-09-15: 15 mL via INTRAOCULAR

## 2013-09-15 MED ORDER — FENTANYL CITRATE 0.05 MG/ML IJ SOLN
25.0000 ug | INTRAMUSCULAR | Status: AC
  Administered 2013-09-15 (×2): 25 ug via INTRAVENOUS

## 2013-09-15 MED ORDER — PHENYLEPHRINE HCL 2.5 % OP SOLN
1.0000 [drp] | OPHTHALMIC | Status: AC | PRN
  Administered 2013-09-15 (×3): 1 [drp] via OPHTHALMIC

## 2013-09-15 MED ORDER — PROVISC 10 MG/ML IO SOLN
INTRAOCULAR | Status: DC | PRN
  Administered 2013-09-15: 0.85 mL via INTRAOCULAR

## 2013-09-15 MED ORDER — TRYPAN BLUE 0.06 % OP SOLN
OPHTHALMIC | Status: AC
  Filled 2013-09-15: qty 0.5

## 2013-09-15 MED ORDER — LIDOCAINE HCL (PF) 1 % IJ SOLN
INTRAMUSCULAR | Status: AC
  Filled 2013-09-15: qty 2

## 2013-09-15 MED ORDER — TETRACAINE 0.5 % OP SOLN OPTIME - NO CHARGE
OPHTHALMIC | Status: DC | PRN
  Administered 2013-09-15: 1 [drp] via OPHTHALMIC

## 2013-09-15 MED ORDER — KETOROLAC TROMETHAMINE 0.5 % OP SOLN
1.0000 [drp] | OPHTHALMIC | Status: AC | PRN
  Administered 2013-09-15 (×3): 1 [drp] via OPHTHALMIC

## 2013-09-15 MED ORDER — TETRACAINE HCL 0.5 % OP SOLN
1.0000 [drp] | OPHTHALMIC | Status: AC | PRN
  Administered 2013-09-15 (×3): 1 [drp] via OPHTHALMIC

## 2013-09-15 MED ORDER — CYCLOPENTOLATE-PHENYLEPHRINE 0.2-1 % OP SOLN
1.0000 [drp] | OPHTHALMIC | Status: AC | PRN
  Administered 2013-09-15 (×3): 1 [drp] via OPHTHALMIC

## 2013-09-15 MED ORDER — TETRACAINE HCL 0.5 % OP SOLN
OPHTHALMIC | Status: AC
  Filled 2013-09-15: qty 2

## 2013-09-15 MED ORDER — PHENYLEPHRINE HCL 2.5 % OP SOLN
OPHTHALMIC | Status: AC
  Filled 2013-09-15: qty 15

## 2013-09-15 MED ORDER — LIDOCAINE HCL (PF) 1 % IJ SOLN
INTRAMUSCULAR | Status: DC | PRN
  Administered 2013-09-15: 1 mL

## 2013-09-15 MED ORDER — MIDAZOLAM HCL 2 MG/2ML IJ SOLN
INTRAMUSCULAR | Status: AC
  Filled 2013-09-15: qty 2

## 2013-09-15 MED ORDER — EPINEPHRINE HCL 1 MG/ML IJ SOLN
INTRAMUSCULAR | Status: AC
  Filled 2013-09-15: qty 1

## 2013-09-15 MED ORDER — EPINEPHRINE HCL 1 MG/ML IJ SOLN
INTRAOCULAR | Status: DC | PRN
  Administered 2013-09-15: 11:00:00

## 2013-09-15 MED ORDER — LACTATED RINGERS IV SOLN
INTRAVENOUS | Status: DC
  Administered 2013-09-15: 11:00:00 via INTRAVENOUS

## 2013-09-15 MED ORDER — FENTANYL CITRATE 0.05 MG/ML IJ SOLN
INTRAMUSCULAR | Status: AC
  Filled 2013-09-15: qty 2

## 2013-09-15 SURGICAL SUPPLY — 24 items
CAPSULAR TENSION RING-AMO (OPHTHALMIC RELATED) IMPLANT
CLOTH BEACON ORANGE TIMEOUT ST (SAFETY) ×1 IMPLANT
EYE SHIELD UNIVERSAL CLEAR (GAUZE/BANDAGES/DRESSINGS) ×1 IMPLANT
GLOVE BIO SURGEON STRL SZ 6.5 (GLOVE) ×1 IMPLANT
GLOVE ECLIPSE 6.5 STRL STRAW (GLOVE) IMPLANT
GLOVE ECLIPSE 7.0 STRL STRAW (GLOVE) IMPLANT
GLOVE EXAM NITRILE LRG STRL (GLOVE) IMPLANT
GLOVE EXAM NITRILE MD LF STRL (GLOVE) IMPLANT
GLOVE SKINSENSE NS SZ6.5 (GLOVE)
GLOVE SKINSENSE STRL SZ6.5 (GLOVE) IMPLANT
GLOVE SS N UNI LF 7.0 STRL (GLOVE) ×1 IMPLANT
HEALON 5 0.6 ML (INTRAOCULAR LENS) IMPLANT
KIT VITRECTOMY (OPHTHALMIC RELATED) IMPLANT
PAD ARMBOARD 7.5X6 YLW CONV (MISCELLANEOUS) ×1 IMPLANT
PROC W NO LENS (INTRAOCULAR LENS)
PROC W SPEC LENS (INTRAOCULAR LENS)
PROCESS W NO LENS (INTRAOCULAR LENS) IMPLANT
PROCESS W SPEC LENS (INTRAOCULAR LENS) IMPLANT
RING MALYGIN (MISCELLANEOUS) ×1 IMPLANT
SIGHTPATH CAT PROC W REG LENS (Ophthalmic Related) ×2 IMPLANT
TAPE SURG TRANSPORE 1 IN (GAUZE/BANDAGES/DRESSINGS) IMPLANT
TAPE SURGICAL TRANSPORE 1 IN (GAUZE/BANDAGES/DRESSINGS) ×1
VISCOELASTIC ADDITIONAL (OPHTHALMIC RELATED) IMPLANT
WATER STERILE IRR 250ML POUR (IV SOLUTION) ×1 IMPLANT

## 2013-09-15 NOTE — Op Note (Signed)
Patient brought to the operating room and prepped and draped in the usual manner.  Lid speculum inserted in right eye.  Stab incision made at the twelve o'clock position.  Intraocular Xylocaine instilled.  Provisc instilled in the anterior chamber.   A 2.4 mm. Stab incision was made temporally. Due to a small pupil, a Malugyn Ring was inserted.   An anterior capsulotomy was done with a bent 25 gauge needle.  The nucleus was hydrodissected.  The Phaco tip was inserted in the anterior chamber and the nucleus was emulsified.  CDE was 10.74.  The cortical material was then removed with the I and A tip.  Posterior capsule was the polished.  The anterior chamber was deepened with Provisc.  A 23.5 diopter Alcon [id number] IOL was then inserted in the capsular bag. The Malugyn Ring was removed.   Provisc was then removed with the I and A tip.  The wound was then hydrated.  Patient sent to the Recovery Room in good condition with follow up in my office.  Preoperative Diagnosis:  Nuclear Cataract OD Postoperative Diagnosis:  Same Procedure name: Kelman Phacoemulsification OD with IOL

## 2013-09-15 NOTE — Transfer of Care (Signed)
Immediate Anesthesia Transfer of Care Note  Patient: [name]  Procedure(s) Performed: Procedure(s) with comments: CATARACT EXTRACTION PHACO AND INTRAOCULAR LENS PLACEMENT (IOC) (Right) - CDE:  10.74  Patient Location: Short Stay  Anesthesia Type:MAC  Level of Consciousness: awake, alert  and oriented  Airway & Oxygen Therapy: Patient Spontanous Breathing  Post-op Assessment: Report given to PACU RN  Post vital signs: Reviewed  Complications: No apparent anesthesia complications

## 2013-09-15 NOTE — Anesthesia Preprocedure Evaluation (Signed)
Anesthesia Evaluation  Patient identified by MRN, date of birth, ID band Patient awake    Reviewed: Allergy & Precautions, H&P , NPO status , Patient's Chart, lab work & pertinent test results, reviewed documented beta blocker date and time   Airway Mallampati: II TM Distance: >3 FB     Dental  (+) Edentulous Upper, Edentulous Lower   Pulmonary former smoker,  breath sounds clear to auscultation        Cardiovascular hypertension, Pt. on medications and Pt. on home beta blockers + CAD and +CHF + Cardiac Defibrillator Rhythm:Regular Rate:Normal     Neuro/Psych    GI/Hepatic   Endo/Other    Renal/GU Renal disease     Musculoskeletal  (+) Arthritis -,   Abdominal   Peds  Hematology  (+) anemia ,   Anesthesia Other Findings   Reproductive/Obstetrics                           Anesthesia Physical Anesthesia Plan  ASA: III  Anesthesia Plan: MAC   Post-op Pain Management:    Induction: Intravenous  Airway Management Planned: Nasal Cannula  Additional Equipment:   Intra-op Plan:   Post-operative Plan:   Informed Consent: I have reviewed the patients History and Physical, chart, labs and discussed the procedure including the risks, benefits and alternatives for the proposed anesthesia with the patient or authorized representative who has indicated his/her understanding and acceptance.     Plan Discussed with:   Anesthesia Plan Comments:         Anesthesia Quick Evaluation

## 2013-09-15 NOTE — Discharge Instructions (Signed)
PATIENT INSTRUCTIONS POST-ANESTHESIA  IMMEDIATELY FOLLOWING SURGERY:  Do not drive or operate machinery for the first twenty four hours after surgery.  Do not make any important decisions for twenty four hours after surgery or while taking narcotic pain medications or sedatives.  If you develop intractable nausea and vomiting or a severe headache please notify your doctor immediately.  FOLLOW-UP:  Please make an appointment with your surgeon as instructed. You do not need to follow up with anesthesia unless specifically instructed to do so.  WOUND CARE INSTRUCTIONS (if applicable):  Keep a dry clean dressing on the anesthesia/puncture wound site if there is drainage.  Once the wound has quit draining you may leave it open to air.  Generally you should leave the bandage intact for twenty four hours unless there is drainage.  If the epidural site drains for more than 36-48 hours please call the anesthesia department.  QUESTIONS?:  Please feel free to call your physician or the hospital operator if you have any questions, and they will be happy to assist you.      JACQUESE YARWOOD  09/15/2013           [facility] Instructions [place] [id number]      1. Avoid closing eyes tightly. One often closes the eye tightly when laughing, talking, sneezing, coughing or if they feel irritated. At these times, you should be careful not to close your eyes tightly.  2. Instill eye drops as instructed. To instill drops in your eye, open it, look up and have someone gently pull the lower lid down and instill a couple of drops inside the lower lid.  3. Do not touch upper lid.  4. Take Advil or Tylenol for pain.  5. You may use either eye for near work, such as reading or sewing and you may watch television.  6. You may have your hair done at the beauty parlor at any time.  7. Wear dark glasses with or without your own glasses if you are in bright light.  8.  Call our office at [phone] or [phone] if you have sharp pain in your eye or unusual symptoms.  9. Do not be concerned because vision in the operative eye is not good. It will not be good, no matter how successful the operation, until you get a special lens for it. Your old glasses will not be suited to the new eye that was operated on and you will not be ready for a new lens for about a month.  10. [facility].    I have received a copy of the above instructions and will follow them.

## 2013-09-15 NOTE — Anesthesia Postprocedure Evaluation (Signed)
  Anesthesia Post-op Note  Patient: [name]  Procedure(s) Performed: Procedure(s) with comments: CATARACT EXTRACTION PHACO AND INTRAOCULAR LENS PLACEMENT (IOC) (Right) - CDE:  10.74  Patient Location: Short Stay  Anesthesia Type:MAC  Level of Consciousness: awake, alert  and oriented  Airway and Oxygen Therapy: Patient Spontanous Breathing  Post-op Pain: none  Post-op Assessment: Post-op Vital signs reviewed, Patient's Cardiovascular Status Stable, Respiratory Function Stable, Patent Airway and No signs of Nausea or vomiting  Post-op Vital Signs: Reviewed and stable  Last Vitals:  Filed Vitals:   09/15/13 1030  BP: 139/69  Pulse:   Temp:   Resp: 22    Complications: No apparent anesthesia complications

## 2013-09-15 NOTE — H&P (Signed)
The patient was re examined and there is no change in the patients condition since the original H and P. 

## 2013-09-16 ENCOUNTER — Encounter (HOSPITAL_COMMUNITY): Payer: Self-pay | Admitting: Ophthalmology

## 2013-09-18 NOTE — Progress Notes (Signed)
Labs drawn

## 2013-11-05 ENCOUNTER — Ambulatory Visit (INDEPENDENT_AMBULATORY_CARE_PROVIDER_SITE_OTHER): Payer: Medicare HMO | Admitting: Cardiovascular Disease

## 2013-11-05 ENCOUNTER — Encounter: Payer: Self-pay | Admitting: Cardiovascular Disease

## 2013-11-05 VITALS — BP 114/56 | HR 72 | Ht 65.0 in | Wt 143.0 lb

## 2013-11-05 DIAGNOSIS — I7090 Unspecified atherosclerosis: Secondary | ICD-10-CM

## 2013-11-05 DIAGNOSIS — I255 Ischemic cardiomyopathy: Secondary | ICD-10-CM

## 2013-11-05 DIAGNOSIS — I5022 Chronic systolic (congestive) heart failure: Secondary | ICD-10-CM

## 2013-11-05 DIAGNOSIS — Z95810 Presence of automatic (implantable) cardiac defibrillator: Secondary | ICD-10-CM

## 2013-11-05 DIAGNOSIS — I509 Heart failure, unspecified: Secondary | ICD-10-CM

## 2013-11-05 DIAGNOSIS — E785 Hyperlipidemia, unspecified: Secondary | ICD-10-CM

## 2013-11-05 DIAGNOSIS — I2589 Other forms of chronic ischemic heart disease: Secondary | ICD-10-CM

## 2013-11-05 DIAGNOSIS — I10 Essential (primary) hypertension: Secondary | ICD-10-CM

## 2013-11-05 DIAGNOSIS — I2510 Atherosclerotic heart disease of native coronary artery without angina pectoris: Secondary | ICD-10-CM

## 2013-11-05 MED ORDER — NITROGLYCERIN 0.4 MG SL SUBL
0.4000 mg | SUBLINGUAL_TABLET | SUBLINGUAL | Status: DC | PRN
Start: 2013-11-05 — End: 2014-10-18

## 2013-11-05 NOTE — Progress Notes (Signed)
Patient ID: [name], female   DOB: 1941-03-02, 73 y.o.   MRN: [id number]      SUBJECTIVE: [name] is a 73 yr old female with a history of chronic systolic congestive heart failure, CAD s/p remote MI, ICM with EF 30-35% s/p BiV ICD implantation who was hospitalized in March for influenza and healthcare associated pneumonia. Her device was interrogated during that time and was found to be functioning normally.   An echocardiogram performed in March showed severely reduced left ventricular systolic function, EF 99991111, grade 2 diastolic dysfunction, moderate left atrial enlargement, and mild mitral regurgitation.   She occasionally has some self-limiting chest tightness but denies shortness of breath. She also denies leg swelling and palpitations.    Wt 143 lbs today, 136 lbs on 5/22, 139 lbs on 5/11, 134 lbs on 4/3.     No Known Allergies  Current Outpatient Prescriptions  Medication Sig Dispense Refill  . acetaminophen (TYLENOL) 500 MG tablet Take 500 mg by mouth every 6 (six) hours as needed.      [name] aspirin 325 MG EC tablet Take 325 mg by mouth daily.      . carvedilol (COREG) 6.25 MG tablet Take 1 tablet (6.25 mg total) by mouth 2 (two) times daily with a meal.  60 tablet  5  . colchicine 0.6 MG tablet Take 0.6 mg by mouth 2 (two) times daily.      . furosemide (LASIX) 40 MG tablet Take 1 tablet (40 mg total) by mouth 2 (two) times daily.  60 tablet  6  . ibuprofen (ADVIL,MOTRIN) 200 MG tablet Take 1 tablet (200 mg total) by mouth every 6 (six) hours as needed for fever, headache, mild pain or moderate pain.      [name] lisinopril (PRINIVIL,ZESTRIL) 10 MG tablet Take 40 mg by mouth daily.      [name] lovastatin (MEVACOR) 40 MG tablet Take 1 tablet by mouth at bedtime.      [name] omeprazole (PRILOSEC) 20 MG capsule Take 20 mg by mouth daily. For gas/heartburn      . potassium chloride (K-DUR) 10 MEQ tablet Take 1 tablet (10 mEq total) by mouth 2 (two) times daily.  60 tablet  0   No  current facility-administered medications for this visit.    Past Medical History  Diagnosis Date  . Arteriosclerotic cardiovascular disease (ASCVD)     Remote PTCA by patient report; LBBB; associated cardiomyopathy, presumed ischemic with EF 40-45% previously, 20% in 06/2009; h/o clinical congestive heart failure; negative stress nuclear in 2009 with inferoseptal and apical scar  . Anemia     Hgb of 9-10  . Hyperlipidemia   . Breast cancer   . Hypertension   . Automatic implantable cardioverter-defibrillator in situ   . HOH (hard of hearing)     Past Surgical History  Procedure Laterality Date  . Total knee arthroplasty Right     Dr.Harrison  . Bi-ventricular implantable cardioverter defibrillator  (crt-d)  07/28/2012  . Cataract extraction w/ intraocular lens implant Left   . Tubal ligation    . Breast biopsy Bilateral   . Mastectomy Left 1998  . Cataract extraction w/phaco Right 09/15/2013    Procedure: CATARACT EXTRACTION PHACO AND INTRAOCULAR LENS PLACEMENT (IOC);  Surgeon: Elta Guadeloupe T. Gershon Crane, MD;  Location: [facility];  Service: Ophthalmology;  Laterality: Right;  CDE:  10.74    History   Social History  . Marital Status: Married    Spouse Name: N/A  Number of Children: N/A  . Years of Education: N/A   Occupational History  . Retired    Social History Main Topics  . Smoking status: Former Smoker    Types: Cigarettes  . Smokeless tobacco: Current User    Types: Chew     Comment: 07/03/2013 "smoked some; don't know how much or how long or when I quit"  . Alcohol Use: No  . Drug Use: No  . Sexual Activity: Not Currently   Other Topics Concern  . Not on file   Social History Narrative  . No narrative on file   BP 114/56 Pulse 72    PHYSICAL EXAM General: NAD  Neck: No JVD, no thyromegaly.  Lungs: Clear to auscultation bilaterally with normal respiratory effort.  CV: Nondisplaced PMI. Regular rate and rhythm, normal S1/S2, no S3/S4, soft II/VI systolic murmur  over RUSB. No pretibial or periankle edema. No carotid bruit. Normal pedal pulses.  Abdomen: Soft, nontender, no hepatosplenomegaly, no distention.  Neurologic: Alert and oriented x 3.  Psych: Normal affect.  Extremities: No clubbing or cyanosis.    ECG: reviewed and available in electronic records.      ASSESSMENT AND PLAN: 1. Ischemic cardiomyopathy/chronic systolic heart failure, EF 30-35%: She appears to be compensated. I will continue Lasix 40 mg twice daily. For the time being, I will withhold starting her back on spironolactone. I will continue carvedilol 6.25 mg twice daily along with lisinopril 40 mg daily.  2. CAD: Symptomatically stable. Continue aspirin, beta blocker, and lovastatin. I will prescribe SL nitroglycerin.  3. Hypertension: Controlled today on current therapy which includes carvedilol and lisinopril.   4. BiVICD: Most recent nterrogation found it to be functioning normally.   Dispo: f/u 4 months.  Kate Sable, M.D., F.A.C.C.

## 2013-11-05 NOTE — Patient Instructions (Signed)
Your physician recommends that you schedule a follow-up appointment in: 4 months with Dr Virgina Jock will receive a reminder letter two months in advance reminding you to call and schedule your appointment. If you don't receive this letter, please contact our office.  The proper use and anticipated side effects of nitroglycerine has been carefully explained.  If a single episode of chest pain is not relieved by one tablet, the patient will try another within 5 minutes; and if this doesn't relieve the pain, the patient is instructed to call 911 for transportation to an emergency department.

## 2013-11-26 ENCOUNTER — Encounter (HOSPITAL_COMMUNITY): Payer: Medicare HMO | Attending: Hematology and Oncology

## 2013-11-26 DIAGNOSIS — C50919 Malignant neoplasm of unspecified site of unspecified female breast: Secondary | ICD-10-CM

## 2013-11-26 DIAGNOSIS — D509 Iron deficiency anemia, unspecified: Secondary | ICD-10-CM | POA: Insufficient documentation

## 2013-11-26 DIAGNOSIS — M069 Rheumatoid arthritis, unspecified: Secondary | ICD-10-CM | POA: Diagnosis present

## 2013-11-26 LAB — CBC WITH DIFFERENTIAL/PLATELET
BASOS PCT: 1 % (ref 0–1)
Basophils Absolute: 0 10*3/uL (ref 0.0–0.1)
Eosinophils Absolute: 0.1 10*3/uL (ref 0.0–0.7)
Eosinophils Relative: 1 % (ref 0–5)
HEMATOCRIT: 32.6 % — AB (ref 36.0–46.0)
Hemoglobin: 10.1 g/dL — ABNORMAL LOW (ref 12.0–15.0)
Lymphocytes Relative: 26 % (ref 12–46)
Lymphs Abs: 2 10*3/uL (ref 0.7–4.0)
MCH: 24.9 pg — ABNORMAL LOW (ref 26.0–34.0)
MCHC: 31 g/dL (ref 30.0–36.0)
MCV: 80.5 fL (ref 78.0–100.0)
MONO ABS: 0.8 10*3/uL (ref 0.1–1.0)
MONOS PCT: 10 % (ref 3–12)
Neutro Abs: 4.8 10*3/uL (ref 1.7–7.7)
Neutrophils Relative %: 62 % (ref 43–77)
Platelets: 236 10*3/uL (ref 150–400)
RBC: 4.05 MIL/uL (ref 3.87–5.11)
RDW: 22.5 % — ABNORMAL HIGH (ref 11.5–15.5)
WBC: 7.7 10*3/uL (ref 4.0–10.5)

## 2013-11-26 LAB — FERRITIN: Ferritin: 243 ng/mL (ref 10–291)

## 2013-11-26 NOTE — Progress Notes (Signed)
LABS DRAWN FOR FERR,CBCD

## 2013-12-02 ENCOUNTER — Ambulatory Visit (INDEPENDENT_AMBULATORY_CARE_PROVIDER_SITE_OTHER): Payer: Medicare HMO | Admitting: *Deleted

## 2013-12-02 ENCOUNTER — Other Ambulatory Visit (HOSPITAL_COMMUNITY): Payer: Self-pay

## 2013-12-02 ENCOUNTER — Encounter (HOSPITAL_COMMUNITY): Payer: Self-pay

## 2013-12-02 ENCOUNTER — Encounter (HOSPITAL_BASED_OUTPATIENT_CLINIC_OR_DEPARTMENT_OTHER): Payer: Medicare HMO

## 2013-12-02 VITALS — BP 133/46 | HR 71 | Temp 98.4°F | Resp 18 | Wt 146.5 lb

## 2013-12-02 DIAGNOSIS — Z853 Personal history of malignant neoplasm of breast: Secondary | ICD-10-CM

## 2013-12-02 DIAGNOSIS — C50912 Malignant neoplasm of unspecified site of left female breast: Secondary | ICD-10-CM

## 2013-12-02 DIAGNOSIS — I5022 Chronic systolic (congestive) heart failure: Secondary | ICD-10-CM

## 2013-12-02 DIAGNOSIS — M069 Rheumatoid arthritis, unspecified: Secondary | ICD-10-CM

## 2013-12-02 DIAGNOSIS — D649 Anemia, unspecified: Secondary | ICD-10-CM

## 2013-12-02 DIAGNOSIS — I509 Heart failure, unspecified: Secondary | ICD-10-CM

## 2013-12-02 DIAGNOSIS — I2589 Other forms of chronic ischemic heart disease: Secondary | ICD-10-CM

## 2013-12-02 DIAGNOSIS — I255 Ischemic cardiomyopathy: Secondary | ICD-10-CM

## 2013-12-02 DIAGNOSIS — D509 Iron deficiency anemia, unspecified: Secondary | ICD-10-CM

## 2013-12-02 NOTE — Patient Instructions (Signed)
[facility] Discharge Instructions  RECOMMENDATIONS MADE BY THE CONSULTANT AND ANY TEST RESULTS WILL BE SENT TO YOUR REFERRING PHYSICIAN.  EXAM FINDINGS BY THE PHYSICIAN TODAY AND SIGNS OR SYMPTOMS TO REPORT TO CLINIC OR PRIMARY PHYSICIAN: Exam and findings as discussed by Dr. Barnet Glasgow.  Your blood work is stable. You don't need an iron infusion right now.  MEDICATIONS PRESCRIBED:  none  INSTRUCTIONS/FOLLOW-UP: Follow-up in 3 months with labs and office visit.  Thank you for choosing [facility] to provide your oncology and hematology care.  To afford each patient quality time with our providers, please arrive at least 15 minutes before your scheduled appointment time.  With your help, our goal is to use those 15 minutes to complete the necessary work-up to ensure our physicians have the information they need to help with your evaluation and healthcare recommendations.    Effective January 1st, 2014, we ask that you re-schedule your appointment with our physicians should you arrive 10 or more minutes late for your appointment.  We strive to give you quality time with our providers, and arriving late affects you and other patients whose appointments are after yours.    Again, thank you for choosing [facility].  Our hope is that these requests will decrease the amount of time that you wait before being seen by our physicians.       _____________________________________________________________  Should you have questions after your visit to [facility], please contact our office at [phone] between the hours of 8:30 a.m. and 4:30 p.m.  Voicemails left after 4:30 p.m. will not be returned until the following business day.  For prescription refill requests, have your pharmacy contact our office with your prescription refill request.    _______________________________________________________________  We hope that we have given you  very good care.  You may receive a patient satisfaction survey in the mail, please complete it and return it as soon as possible.  We value your feedback!  _______________________________________________________________  Have you asked about our STAR program?  STAR stands for Survivorship Training and Rehabilitation, and this is a nationally recognized cancer care program that focuses on survivorship and rehabilitation.  Cancer and cancer treatments may cause problems, such as, pain, making you feel tired and keeping you from doing the things that you need or want to do. Cancer rehabilitation can help. Our goal is to reduce these troubling effects and help you have the best quality of life possible.  You may receive a survey from a nurse that asks questions about your current state of health.  Based on the survey results, all eligible patients will be referred to the [facility] program for an evaluation so we can better serve you!  A frequently asked questions sheet is available upon request.

## 2013-12-02 NOTE — Progress Notes (Signed)
Remote ICD transmission.   

## 2013-12-02 NOTE — Progress Notes (Signed)
[facility] [place] [facility]  OFFICE PROGRESS [name], MD [address] [place] [address]  DIAGNOSIS: Iron deficiency anemia  Rheumatoid arthritis  Breast cancer, left  Chief Complaint  Patient presents with  . Anemia  . Breast Cancer  . Iron deficiency    CURRENT THERAPY: IV Feraheme on 09/02/2013, watchful expectation for remote left breast cancer in 1992 treated with modified radical mastectomy and 5 years of tamoxifen, no radiotherapy.  INTERVAL HISTORY: [name] 73 y.o. female returns for followup of iron deficiency anemia with a remote history of breast cancer, cardioverted defibrillator in situ . She feels well with increased energy. Bowel movements are regular with no melena, hematochezia, hematuria, epistaxis, or hemoptysis. She denies any cough, wheezing, PND, orthopnea, palpitations, lower extremity swelling or redness, chest pain, headache, or seizures.  MEDICAL HISTORY: Past Medical History  Diagnosis Date  . Arteriosclerotic cardiovascular disease (ASCVD)     Remote PTCA by patient report; LBBB; associated cardiomyopathy, presumed ischemic with EF 40-45% previously, 20% in 06/2009; h/o clinical congestive heart failure; negative stress nuclear in 2009 with inferoseptal and apical scar  . Anemia     Hgb of 9-10  . Hyperlipidemia   . Breast cancer   . Hypertension   . Automatic implantable cardioverter-defibrillator in situ   . HOH (hard of hearing)     INTERIM HISTORY: has HYPERLIPIDEMIA; Chronic systolic heart failure; Anemia; Breast cancer; Hypertension; Biventricular ICD (implantable cardioverter-defibrillator) in place; Hypokalemia; Pain and swelling of wrist; Chest pain; Rheumatoid arthritis; HCAP (healthcare-associated pneumonia); Sepsis; ARF (acute renal failure); PNA (pneumonia); Malnutrition of moderate degree; and Iron deficiency anemia on her problem list.    ALLERGIES:  has No Known  Allergies.  MEDICATIONS: has a current medication list which includes the following prescription(s): acetaminophen, amlodipine, aspirin, carvedilol, furosemide, hydroxychloroquine, ibuprofen, lisinopril, lovastatin, nitroglycerin, omeprazole, prednisone, and potassium chloride.  SURGICAL HISTORY:  Past Surgical History  Procedure Laterality Date  . Total knee arthroplasty Right     Dr.Harrison  . Bi-ventricular implantable cardioverter defibrillator  (crt-d)  07/28/2012  . Cataract extraction w/ intraocular lens implant Left   . Tubal ligation    . Breast biopsy Bilateral   . Mastectomy Left 1998  . Cataract extraction w/phaco Right 09/15/2013    Procedure: CATARACT EXTRACTION PHACO AND INTRAOCULAR LENS PLACEMENT (IOC);  Surgeon: Elta Guadeloupe T. Gershon Crane, MD;  Location: [facility];  Service: Ophthalmology;  Laterality: Right;  CDE:  10.74    FAMILY HISTORY: family history includes Cancer in her father; Diabetes in her father; Hypertension in her brother.  SOCIAL HISTORY:  reports that she has quit smoking. Her smoking use included Cigarettes. She smoked 0.00 packs per day. Her smokeless tobacco use includes Chew. She reports that she does not drink alcohol or use illicit drugs.  REVIEW OF SYSTEMS:  Other than that discussed above is noncontributory.  PHYSICAL EXAMINATION: ECOG PERFORMANCE STATUS: 1 - Symptomatic but completely ambulatory  Blood pressure 133/46, pulse 71, temperature 98.4 F (36.9 C), temperature source Oral, resp. rate 18, weight 146 lb 8 oz (66.452 kg).  GENERAL:alert, no distress and comfortable SKIN: skin color, texture, turgor are normal, no rashes or significant lesions EYES: PERLA; Conjunctiva are pink and non-injected, sclera clear SINUSES: No redness or tenderness over maxillary or ethmoid sinuses OROPHARYNX:no exudate, no erythema on lips, buccal mucosa, or tongue. NECK: supple, thyroid normal size, non-tender, without nodularity. No masses CHEST: Status post left  mastectomy with no subcutaneous  nodules. Perry defibrillator in place. LYMPH:  no palpable lymphadenopathy in the cervical, axillary or inguinal LUNGS: clear to auscultation and percussion with normal breathing effort HEART: regular rate & rhythm and no murmurs. ABDOMEN:abdomen soft, non-tender and normal bowel sounds MUSCULOSKELETAL:no cyanosis of digits and no clubbing. Range of motion normal.  NEURO: alert & oriented x 3 with fluent speech, no focal motor/sensory deficits. Severe hearing deficit.   LABORATORY DATA:  IV Feraheme given on 09/02/2013  Results for CATHERYN, YOUNIS (MRN [id number]) as of 12/02/2013 11:07  Ref. Range 08/19/2013 10:16 09/02/2013 09:12 09/09/2013 13:05 09/11/2013 10:05 11/26/2013 11:19  Hemoglobin Latest Range: 12.0-15.0 g/dL 8.8 (L) 8.5 (L) 8.7 (L)  10.1 (L)       Lab on 11/26/2013  Component Date Value Ref Range Status  . WBC 11/26/2013 7.7  4.0 - 10.5 K/uL Final  . RBC 11/26/2013 4.05  3.87 - 5.11 MIL/uL Final  . Hemoglobin 11/26/2013 10.1* 12.0 - 15.0 g/dL Final  . HCT 11/26/2013 32.6* 36.0 - 46.0 % Final  . MCV 11/26/2013 80.5  78.0 - 100.0 fL Final  . MCH 11/26/2013 24.9* 26.0 - 34.0 pg Final  . MCHC 11/26/2013 31.0  30.0 - 36.0 g/dL Final  . RDW 11/26/2013 22.5* 11.5 - 15.5 % Final  . Platelets 11/26/2013 236  150 - 400 K/uL Final  . Neutrophils Relative % 11/26/2013 62  43 - 77 % Final  . Neutro Abs 11/26/2013 4.8  1.7 - 7.7 K/uL Final  . Lymphocytes Relative 11/26/2013 26  12 - 46 % Final  . Lymphs Abs 11/26/2013 2.0  0.7 - 4.0 K/uL Final  . Monocytes Relative 11/26/2013 10  3 - 12 % Final  . Monocytes Absolute 11/26/2013 0.8  0.1 - 1.0 K/uL Final  . Eosinophils Relative 11/26/2013 1  0 - 5 % Final  . Eosinophils Absolute 11/26/2013 0.1  0.0 - 0.7 K/uL Final  . Basophils Relative 11/26/2013 1  0 - 1 % Final  . Basophils Absolute 11/26/2013 0.0  0.0 - 0.1 K/uL Final  . Ferritin 11/26/2013 243  10 - 291 ng/mL Final   Performed at [facility]: No new pathology.  Urinalysis    Component Value Date/Time   COLORURINE YELLOW 07/17/2013 2240   APPEARANCEUR CLOUDY* 07/17/2013 2240   LABSPEC 1.010 07/17/2013 2240   PHURINE 5.5 07/17/2013 2240   GLUCOSEU NEGATIVE 07/17/2013 2240   HGBUR LARGE* 07/17/2013 2240   BILIRUBINUR NEGATIVE 07/17/2013 2240   KETONESUR NEGATIVE 07/17/2013 2240   PROTEINUR NEGATIVE 07/17/2013 2240   UROBILINOGEN 0.2 07/17/2013 2240   NITRITE NEGATIVE 07/17/2013 2240   LEUKOCYTESUR MODERATE* 07/17/2013 2240    RADIOGRAPHIC STUDIES: No results found.  ASSESSMENT:  #1. Iron deficiency anemia, improved after iron infusion in May 2015. #2. Ischemic cardiomyopathy with diastolic heart failure, compensated.  #3. History of left breast cancer 1992, status post left modified radical mastectomy followed by 5 years of tamoxifen, no evidence of disease.  #4. Recent episode of pneumonia, resolved.  #5. Hypertension, controlled.  #6..BiVICD, functioning well.  #7. Hearing deficit    PLAN:  #1. Return in 3 months with CBC and ferritin. Patient was told to call should he develop increasing weakness or fatigue so that blood count could be checked.   All questions were answered. The patient knows to call the clinic with any problems, questions or concerns. We can certainly see the patient much sooner if necessary.   I spent 25 minutes counseling the  patient face to face. The total time spent in the appointment was 30 minutes.    Doroteo Bradford, MD 12/02/2013 12:08 PM  DISCLAIMER:  This note was dictated with voice recognition software.  Similar sounding words can inadvertently be transcribed inaccurately and may not be corrected upon review.

## 2013-12-18 LAB — MDC_IDC_ENUM_SESS_TYPE_REMOTE
Battery Remaining Longevity: 99 mo
Battery Voltage: 3.01 V
Brady Statistic AP VS Percent: 1.67 %
Brady Statistic RA Percent Paced: 27.99 %
Date Time Interrogation Session: 20150812073427
HIGH POWER IMPEDANCE MEASURED VALUE: 190 Ohm
HIGH POWER IMPEDANCE MEASURED VALUE: 75 Ohm
Lead Channel Impedance Value: 342 Ohm
Lead Channel Impedance Value: 399 Ohm
Lead Channel Impedance Value: 760 Ohm
Lead Channel Pacing Threshold Amplitude: 0.5 V
Lead Channel Pacing Threshold Amplitude: 1.125 V
Lead Channel Pacing Threshold Pulse Width: 0.4 ms
Lead Channel Pacing Threshold Pulse Width: 0.5 ms
Lead Channel Sensing Intrinsic Amplitude: 3 mV
Lead Channel Sensing Intrinsic Amplitude: 31.625 mV
Lead Channel Sensing Intrinsic Amplitude: 31.625 mV
Lead Channel Setting Pacing Amplitude: 1.5 V
Lead Channel Setting Pacing Amplitude: 1.5 V
Lead Channel Setting Pacing Pulse Width: 0.4 ms
Lead Channel Setting Pacing Pulse Width: 0.5 ms
MDC IDC MSMT LEADCHNL LV IMPEDANCE VALUE: 532 Ohm
MDC IDC MSMT LEADCHNL RA IMPEDANCE VALUE: 380 Ohm
MDC IDC MSMT LEADCHNL RA PACING THRESHOLD AMPLITUDE: 0.5 V
MDC IDC MSMT LEADCHNL RA PACING THRESHOLD PULSEWIDTH: 0.4 ms
MDC IDC MSMT LEADCHNL RA SENSING INTR AMPL: 3 mV
MDC IDC SET LEADCHNL RV PACING AMPLITUDE: 2.25 V
MDC IDC SET LEADCHNL RV SENSING SENSITIVITY: 0.3 mV
MDC IDC SET ZONE DETECTION INTERVAL: 270 ms
MDC IDC SET ZONE DETECTION INTERVAL: 350 ms
MDC IDC STAT BRADY AP VP PERCENT: 26.32 %
MDC IDC STAT BRADY AS VP PERCENT: 70.52 %
MDC IDC STAT BRADY AS VS PERCENT: 1.49 %
MDC IDC STAT BRADY RV PERCENT PACED: 2.4 %
Zone Setting Detection Interval: 360 ms
Zone Setting Detection Interval: 360 ms

## 2013-12-21 ENCOUNTER — Other Ambulatory Visit: Payer: Self-pay

## 2013-12-21 MED ORDER — LOVASTATIN 40 MG PO TABS: 40.0000 mg | ORAL_TABLET | Freq: Every day | ORAL | Status: DC

## 2013-12-22 ENCOUNTER — Encounter: Payer: Self-pay | Admitting: Cardiology

## 2013-12-28 ENCOUNTER — Encounter: Payer: Self-pay | Admitting: Internal Medicine

## 2014-01-11 ENCOUNTER — Other Ambulatory Visit (HOSPITAL_COMMUNITY): Payer: Self-pay | Admitting: Internal Medicine

## 2014-01-11 ENCOUNTER — Ambulatory Visit (HOSPITAL_COMMUNITY)
Admission: RE | Admit: 2014-01-11 | Discharge: 2014-01-11 | Disposition: A | Discharge status: Home / Self Care | Payer: Medicare HMO | Source: Ambulatory Visit | Attending: Internal Medicine | Admitting: Internal Medicine

## 2014-01-11 DIAGNOSIS — R05 Cough: Secondary | ICD-10-CM | POA: Insufficient documentation

## 2014-01-11 DIAGNOSIS — R059 Cough, unspecified: Secondary | ICD-10-CM | POA: Insufficient documentation

## 2014-01-11 DIAGNOSIS — J40 Bronchitis, not specified as acute or chronic: Secondary | ICD-10-CM

## 2014-01-18 ENCOUNTER — Other Ambulatory Visit: Payer: Self-pay

## 2014-01-18 MED ORDER — LISINOPRIL 40 MG PO TABS: 40.0000 mg | ORAL_TABLET | Freq: Every day | ORAL | Status: DC

## 2014-01-20 ENCOUNTER — Other Ambulatory Visit (HOSPITAL_COMMUNITY): Payer: Self-pay | Admitting: Physician Assistant

## 2014-02-09 ENCOUNTER — Other Ambulatory Visit (HOSPITAL_COMMUNITY): Payer: Self-pay | Admitting: Internal Medicine

## 2014-02-09 DIAGNOSIS — Z1231 Encounter for screening mammogram for malignant neoplasm of breast: Secondary | ICD-10-CM

## 2014-02-25 ENCOUNTER — Ambulatory Visit (HOSPITAL_COMMUNITY)
Admission: RE | Admit: 2014-02-25 | Discharge: 2014-02-25 | Disposition: A | Discharge status: Home / Self Care | Payer: Medicare HMO | Source: Ambulatory Visit | Attending: Internal Medicine | Admitting: Internal Medicine

## 2014-02-25 DIAGNOSIS — Z1231 Encounter for screening mammogram for malignant neoplasm of breast: Secondary | ICD-10-CM | POA: Insufficient documentation

## 2014-03-02 ENCOUNTER — Encounter (HOSPITAL_COMMUNITY): Payer: Medicare HMO | Attending: Hematology and Oncology

## 2014-03-02 DIAGNOSIS — D509 Iron deficiency anemia, unspecified: Secondary | ICD-10-CM | POA: Diagnosis present

## 2014-03-02 DIAGNOSIS — D649 Anemia, unspecified: Secondary | ICD-10-CM | POA: Insufficient documentation

## 2014-03-02 DIAGNOSIS — K909 Intestinal malabsorption, unspecified: Secondary | ICD-10-CM | POA: Insufficient documentation

## 2014-03-02 LAB — FERRITIN: Ferritin: 111 ng/mL (ref 10–291)

## 2014-03-02 LAB — CBC WITH DIFFERENTIAL/PLATELET
BASOS ABS: 0 10*3/uL (ref 0.0–0.1)
BASOS PCT: 1 % (ref 0–1)
Eosinophils Absolute: 0.1 10*3/uL (ref 0.0–0.7)
Eosinophils Relative: 1 % (ref 0–5)
HEMATOCRIT: 31.7 % — AB (ref 36.0–46.0)
HEMOGLOBIN: 9.9 g/dL — AB (ref 12.0–15.0)
Lymphocytes Relative: 32 % (ref 12–46)
Lymphs Abs: 1.9 10*3/uL (ref 0.7–4.0)
MCH: 24.8 pg — AB (ref 26.0–34.0)
MCHC: 31.2 g/dL (ref 30.0–36.0)
MCV: 79.4 fL (ref 78.0–100.0)
Monocytes Absolute: 0.8 10*3/uL (ref 0.1–1.0)
Monocytes Relative: 13 % — ABNORMAL HIGH (ref 3–12)
NEUTROS ABS: 3.2 10*3/uL (ref 1.7–7.7)
Neutrophils Relative %: 53 % (ref 43–77)
Platelets: 330 10*3/uL (ref 150–400)
RBC: 3.99 MIL/uL (ref 3.87–5.11)
RDW: 16.1 % — AB (ref 11.5–15.5)
WBC: 5.9 10*3/uL (ref 4.0–10.5)

## 2014-03-02 NOTE — Progress Notes (Signed)
LABS FOR FERR,CBCD 

## 2014-03-03 ENCOUNTER — Encounter (HOSPITAL_COMMUNITY): Payer: Self-pay

## 2014-03-03 ENCOUNTER — Encounter (HOSPITAL_BASED_OUTPATIENT_CLINIC_OR_DEPARTMENT_OTHER): Payer: Medicare HMO

## 2014-03-03 ENCOUNTER — Encounter (HOSPITAL_COMMUNITY): Payer: Medicare HMO

## 2014-03-03 VITALS — BP 166/73 | HR 98 | Temp 98.8°F | Resp 18 | Wt 144.0 lb

## 2014-03-03 DIAGNOSIS — I10 Essential (primary) hypertension: Secondary | ICD-10-CM

## 2014-03-03 DIAGNOSIS — D509 Iron deficiency anemia, unspecified: Secondary | ICD-10-CM

## 2014-03-03 DIAGNOSIS — C50919 Malignant neoplasm of unspecified site of unspecified female breast: Secondary | ICD-10-CM

## 2014-03-03 DIAGNOSIS — Z853 Personal history of malignant neoplasm of breast: Secondary | ICD-10-CM

## 2014-03-03 DIAGNOSIS — K909 Intestinal malabsorption, unspecified: Secondary | ICD-10-CM

## 2014-03-03 NOTE — Patient Instructions (Signed)
[facility] Discharge Instructions  RECOMMENDATIONS MADE BY THE CONSULTANT AND ANY TEST RESULTS WILL BE SENT TO YOUR REFERRING PHYSICIAN.  We will see you in Friday for a Feraheme infusion.  You will only need one dose. You will have an office visit with lab work in 3 months. Please call for any questions or concerns.     Thank you for choosing [facility] to provide your oncology and hematology care.  To afford each patient quality time with our providers, please arrive at least 15 minutes before your scheduled appointment time.  With your help, our goal is to use those 15 minutes to complete the necessary work-up to ensure our physicians have the information they need to help with your evaluation and healthcare recommendations.    Effective January 1st, 2014, we ask that you re-schedule your appointment with our physicians should you arrive 10 or more minutes late for your appointment.  We strive to give you quality time with our providers, and arriving late affects you and other patients whose appointments are after yours.    Again, thank you for choosing [facility].  Our hope is that these requests will decrease the amount of time that you wait before being seen by our physicians.       _____________________________________________________________  Should you have questions after your visit to [facility], please contact our office at [phone] between the hours of 8:30 a.m. and 4:30 p.m.  Voicemails left after 4:30 p.m. will not be returned until the following business day.  For prescription refill requests, have your pharmacy contact our office with your prescription refill request.    _______________________________________________________________  We hope that we have given you very good care.  You may receive a patient satisfaction survey in the mail, please complete it and return it as soon as possible.  We value your  feedback!  _______________________________________________________________  Have you asked about our STAR program?  STAR stands for Survivorship Training and Rehabilitation, and this is a nationally recognized cancer care program that focuses on survivorship and rehabilitation.  Cancer and cancer treatments may cause problems, such as, pain, making you feel tired and keeping you from doing the things that you need or want to do. Cancer rehabilitation can help. Our goal is to reduce these troubling effects and help you have the best quality of life possible.  You may receive a survey from a nurse that asks questions about your current state of health.  Based on the survey results, all eligible patients will be referred to the [facility] program for an evaluation so we can better serve you!  A frequently asked questions sheet is available upon request.

## 2014-03-03 NOTE — Progress Notes (Signed)
DR Edwina Barth LABS

## 2014-03-03 NOTE — Progress Notes (Signed)
[place]  OFFICE PROGRESS Savannah Ewing, MD [address] [place] [address]  DIAGNOSIS: Malabsorption of iron - Plan: CBC with Differential, Ferritin  Breast cancer, unspecified laterality  Chief Complaint  Patient presents with  . iron deficiency  . history of breast cancer    CURRENT THERAPY: IV Feraheme on 09/02/2013, watchful expectation for remote left breast cancer in 1992 treated with modified radical mastectomy and 5 years of tamoxifen, no radiotherapy.   INTERVAL HISTORY: [name] 73 y.o. female returns for  iron deficiency anemia with a remote history of breast cancer, cardioversion defibrillator in situ . Primary problem has been worsening arthritis involving her wrist, shoulders, and back. She denies any fever, night sweats, easy satiety, and did see her ophthalmologist yesterday who ordered anti-allergic eyedrops. She is significantly hard of hearing. She denies any lower extremity swelling or redness, chest pain, PND, orthopnea, palpitations, or increasing fatigue.  MEDICAL HISTORY: Past Medical History  Diagnosis Date  . Arteriosclerotic cardiovascular disease (ASCVD)     Remote PTCA by patient report; LBBB; associated cardiomyopathy, presumed ischemic with EF 40-45% previously, 20% in 06/2009; h/o clinical congestive heart failure; negative stress nuclear in 2009 with inferoseptal and apical scar  . Anemia     Hgb of 9-10  . Hyperlipidemia   . Breast cancer   . Hypertension   . Automatic implantable cardioverter-defibrillator in situ   . HOH (hard of hearing)     INTERIM HISTORY: has HYPERLIPIDEMIA; Chronic systolic heart failure; Anemia; Breast cancer; Hypertension; Biventricular ICD (implantable cardioverter-defibrillator) in place; Hypokalemia; Pain and swelling of wrist; Chest pain; Rheumatoid arthritis; HCAP (healthcare-associated pneumonia); Sepsis; ARF (acute renal failure); PNA  (pneumonia); Malnutrition of moderate degree; and Iron deficiency anemia on her problem list.    ALLERGIES:  has No Known Allergies.  MEDICATIONS: has a current medication list which includes the following prescription(s): acetaminophen, amlodipine, aspirin, carvedilol, furosemide, hydroxychloroquine, ibuprofen, lisinopril, lovastatin, nitroglycerin, omeprazole, potassium chloride, and prednisone.  SURGICAL HISTORY:  Past Surgical History  Procedure Laterality Date  . Total knee arthroplasty Right     Dr.Harrison  . Bi-ventricular implantable cardioverter defibrillator  (crt-d)  07/28/2012  . Cataract extraction w/ intraocular lens implant Left   . Tubal ligation    . Breast biopsy Bilateral   . Mastectomy Left 1998  . Cataract extraction w/phaco Right 09/15/2013    Procedure: CATARACT EXTRACTION PHACO AND INTRAOCULAR LENS PLACEMENT (IOC);  Surgeon: Elta Guadeloupe T. Gershon Crane, MD;  Location: [facility];  Service: Ophthalmology;  Laterality: Right;  CDE:  10.74    FAMILY HISTORY: family history includes Cancer in her father; Diabetes in her father; Hypertension in her brother.  SOCIAL HISTORY:  reports that she has quit smoking. Her smoking use included Cigarettes. She smoked 0.00 packs per day. Her smokeless tobacco use includes Chew. She reports that she does not drink alcohol or use illicit drugs.  REVIEW OF SYSTEMS:  Other than that discussed above is noncontributory.  PHYSICAL EXAMINATION: ECOG PERFORMANCE STATUS: 1 - Symptomatic but completely ambulatory  Blood pressure 166/73, pulse 98, temperature 98.8 F (37.1 C), temperature source Oral, resp. rate 18, weight 144 lb (65.318 kg), SpO2 96 %.  GENERAL:alert, no distress and comfortable SKIN: skin color, texture, turgor are normal, no rashes or significant lesions EYES: PERLA; Conjunctiva are pink and non-injected, sclera clear SINUSES: No redness or tenderness over maxillary or ethmoid sinuses OROPHARYNX:no exudate, no erythema on lips,  buccal mucosa,  or tongue. NECK: supple, thyroid normal size, non-tender, without nodularity. No masses CHEST: increased AP diameter with no breast masses. Status post left mastectomy with no subcutaneous nodules and no right breast mass. LYMPH:  no palpable lymphadenopathy in the cervical, axillary or inguinal LUNGS: clear to auscultation and percussion with normal breathing effort HEART: regular rate & rhythm and no murmurs. ABDOMEN:abdomen soft, non-tender and normal bowel sounds MUSCULOSKELETAL:no cyanosis of digits and no clubbing. [name] DIP joint distortion NEURO: alert & oriented x 3 with fluent speech, bilateral hearing deficit.    LABORATORY DATA:  Results for Savannah, Holden (MRN [id number]) as of 03/03/2014 11:34  Ref. Range 08/19/2013 10:16 11/26/2013 11:19 03/02/2014 09:27  Ferritin Latest Range: 10-291 ng/mL 30 243 111     Lab on 03/02/2014  Component Date Value Ref Range Status  . WBC 03/02/2014 5.9  4.0 - 10.5 K/uL Final  . RBC 03/02/2014 3.99  3.87 - 5.11 MIL/uL Final  . Hemoglobin 03/02/2014 9.9* 12.0 - 15.0 g/dL Final  . HCT 03/02/2014 31.7* 36.0 - 46.0 % Final  . MCV 03/02/2014 79.4  78.0 - 100.0 fL Final  . MCH 03/02/2014 24.8* 26.0 - 34.0 pg Final  . MCHC 03/02/2014 31.2  30.0 - 36.0 g/dL Final  . RDW 03/02/2014 16.1* 11.5 - 15.5 % Final  . Platelets 03/02/2014 330  150 - 400 K/uL Final  . Neutrophils Relative % 03/02/2014 53  43 - 77 % Final  . Neutro Abs 03/02/2014 3.2  1.7 - 7.7 K/uL Final  . Lymphocytes Relative 03/02/2014 32  12 - 46 % Final  . Lymphs Abs 03/02/2014 1.9  0.7 - 4.0 K/uL Final  . Monocytes Relative 03/02/2014 13* 3 - 12 % Final  . Monocytes Absolute 03/02/2014 0.8  0.1 - 1.0 K/uL Final  . Eosinophils Relative 03/02/2014 1  0 - 5 % Final  . Eosinophils Absolute 03/02/2014 0.1  0.0 - 0.7 K/uL Final  . Basophils Relative 03/02/2014 1  0 - 1 % Final  . Basophils Absolute 03/02/2014 0.0  0.0 - 0.1 K/uL Final  . Ferritin 03/02/2014 111  10 - 291  ng/mL Final   Performed at [facility]    PATHOLOGY:no new pathology.  Urinalysis    Component Value Date/Time   COLORURINE YELLOW 07/17/2013 2240   APPEARANCEUR CLOUDY* 07/17/2013 2240   LABSPEC 1.010 07/17/2013 2240   PHURINE 5.5 07/17/2013 2240   GLUCOSEU NEGATIVE 07/17/2013 2240   HGBUR LARGE* 07/17/2013 2240   BILIRUBINUR NEGATIVE 07/17/2013 2240   KETONESUR NEGATIVE 07/17/2013 2240   PROTEINUR NEGATIVE 07/17/2013 2240   UROBILINOGEN 0.2 07/17/2013 2240   NITRITE NEGATIVE 07/17/2013 2240   LEUKOCYTESUR MODERATE* 07/17/2013 2240    RADIOGRAPHIC STUDIES: Mm Digital Screening Unilat R  02/26/2014   CLINICAL DATA:  Screening.  EXAM: DIGITAL SCREENING UNILATERAL RIGHT MAMMOGRAM WITH CAD  COMPARISON:  Previous exam(s).  ACR Breast Density Category b: There are scattered areas of fibroglandular density.  FINDINGS: There are no findings suspicious for malignancy. Images were processed with CAD.  IMPRESSION: No mammographic evidence of malignancy. A result letter of this screening mammogram will be mailed directly to the patient.  RECOMMENDATION: Screening mammogram in one year. (Code:SM-B-01Y)  BI-RADS CATEGORY  1: Negative.   Electronically Signed   By: Everlean Alstrom M.D.   On: 02/26/2014 07:56    ASSESSMENT:  #1. Iron deficiency anemia, improved after iron infusion in May 2015, now with ferritin of 111 compared to 243 in August 2015. #2. Ischemic  cardiomyopathy with diastolic heart failure, compensated.  #3. History of left breast cancer 1992, status post left modified radical mastectomy followed by 5 years of tamoxifen, no evidence of disease.  #4. Recent episode of pneumonia, resolved.  #5. Hypertension, controlled.  #6..BiVICD, functioning well.  #7. Hearing deficit   PLAN:  #1. Feraheme 510 mg to be given on 03/05/2014 #2. Follow-up in 3 months with CBC, ferritin.   All questions were answered. The patient knows to call the clinic with any problems,  questions or concerns. We can certainly see the patient much sooner if necessary.   I spent 25 minutes counseling the patient face to face. The total time spent in the appointment was 30 minutes.    Doroteo Bradford, MD 03/03/2014 1:05 PM  DISCLAIMER:  This note was dictated with voice recognition software.  Similar sounding words can inadvertently be transcribed inaccurately and may not be corrected upon review.

## 2014-03-05 ENCOUNTER — Encounter (HOSPITAL_COMMUNITY): Payer: Self-pay

## 2014-03-05 ENCOUNTER — Encounter (HOSPITAL_BASED_OUTPATIENT_CLINIC_OR_DEPARTMENT_OTHER): Payer: Medicare HMO

## 2014-03-05 DIAGNOSIS — D509 Iron deficiency anemia, unspecified: Secondary | ICD-10-CM

## 2014-03-05 MED ORDER — SODIUM CHLORIDE 0.9 % IV SOLN
Freq: Once | INTRAVENOUS | Status: AC
  Administered 2014-03-05: 11:00:00 via INTRAVENOUS

## 2014-03-05 MED ORDER — SODIUM CHLORIDE 0.9 % IV SOLN
510.0000 mg | Freq: Once | INTRAVENOUS | Status: AC
  Administered 2014-03-05: 510 mg via INTRAVENOUS
  Filled 2014-03-05: qty 17

## 2014-03-05 NOTE — Patient Instructions (Signed)
[place] Discharge Instructions  RECOMMENDATIONS MADE BY THE CONSULTANT AND ANY TEST RESULTS WILL BE SENT TO YOUR REFERRING PHYSICIAN.  Today you received feraheme (dose 2). Return as scheduled for office visit and lab work.  Thank you for choosing [facility] to provide your oncology and hematology care.  To afford each patient quality time with our providers, please arrive at least 15 minutes before your scheduled appointment time.  With your help, our goal is to use those 15 minutes to complete the necessary work-up to ensure our physicians have the information they need to help with your evaluation and healthcare recommendations.    Effective January 1st, 2014, we ask that you re-schedule your appointment with our physicians should you arrive 10 or more minutes late for your appointment.  We strive to give you quality time with our providers, and arriving late affects you and other patients whose appointments are after yours.    Again, thank you for choosing [facility].  Our hope is that these requests will decrease the amount of time that you wait before being seen by our physicians.       _____________________________________________________________  Should you have questions after your visit to [facility], please contact our office at [phone] between the hours of 8:30 a.m. and 4:30 p.m.  Voicemails left after 4:30 p.m. will not be returned until the following business day.  For prescription refill requests, have your pharmacy contact our office with your prescription refill request.    _______________________________________________________________  We hope that we have given you very good care.  You may receive a patient satisfaction survey in the mail, please complete it and return it as soon as possible.  We value your feedback!  _______________________________________________________________  Have you asked  about our STAR program?  STAR stands for Survivorship Training and Rehabilitation, and this is a nationally recognized cancer care program that focuses on survivorship and rehabilitation.  Cancer and cancer treatments may cause problems, such as, pain, making you feel tired and keeping you from doing the things that you need or want to do. Cancer rehabilitation can help. Our goal is to reduce these troubling effects and help you have the best quality of life possible.  You may receive a survey from a nurse that asks questions about your current state of health.  Based on the survey results, all eligible patients will be referred to the [facility] program for an evaluation so we can better serve you!  A frequently asked questions sheet is available upon request.

## 2014-03-05 NOTE — Progress Notes (Signed)
[name] Tolerated iron infusion well today.  Discharged ambulatory

## 2014-03-08 ENCOUNTER — Encounter: Payer: Self-pay | Admitting: Internal Medicine

## 2014-03-08 ENCOUNTER — Ambulatory Visit (INDEPENDENT_AMBULATORY_CARE_PROVIDER_SITE_OTHER): Payer: Medicare HMO | Admitting: *Deleted

## 2014-03-08 DIAGNOSIS — I5022 Chronic systolic (congestive) heart failure: Secondary | ICD-10-CM

## 2014-03-08 DIAGNOSIS — I255 Ischemic cardiomyopathy: Secondary | ICD-10-CM

## 2014-03-08 NOTE — Progress Notes (Signed)
Remote ICD transmission.   

## 2014-03-09 LAB — MDC_IDC_ENUM_SESS_TYPE_REMOTE
Battery Voltage: 3 V
Brady Statistic AP VS Percent: 1.01 %
Brady Statistic AS VP Percent: 81.85 %
Brady Statistic RA Percent Paced: 16.04 %
Brady Statistic RV Percent Paced: 4.76 %
HIGH POWER IMPEDANCE MEASURED VALUE: 74 Ohm
Lead Channel Impedance Value: 342 Ohm
Lead Channel Impedance Value: 399 Ohm
Lead Channel Impedance Value: 760 Ohm
Lead Channel Pacing Threshold Amplitude: 0.625 V
Lead Channel Pacing Threshold Amplitude: 1.375 V
Lead Channel Pacing Threshold Pulse Width: 0.4 ms
Lead Channel Sensing Intrinsic Amplitude: 2.625 mV
Lead Channel Sensing Intrinsic Amplitude: 2.625 mV
Lead Channel Sensing Intrinsic Amplitude: 29.375 mV
Lead Channel Sensing Intrinsic Amplitude: 29.375 mV
Lead Channel Setting Pacing Amplitude: 1.75 V
Lead Channel Setting Pacing Pulse Width: 0.4 ms
Lead Channel Setting Sensing Sensitivity: 0.3 mV
MDC IDC MSMT BATTERY REMAINING LONGEVITY: 93 mo
MDC IDC MSMT LEADCHNL LV IMPEDANCE VALUE: 532 Ohm
MDC IDC MSMT LEADCHNL LV PACING THRESHOLD PULSEWIDTH: 0.5 ms
MDC IDC MSMT LEADCHNL RA IMPEDANCE VALUE: 380 Ohm
MDC IDC MSMT LEADCHNL RA PACING THRESHOLD AMPLITUDE: 0.5 V
MDC IDC MSMT LEADCHNL RA PACING THRESHOLD PULSEWIDTH: 0.4 ms
MDC IDC MSMT LEADCHNL RV IMPEDANCE VALUE: 323 Ohm
MDC IDC SESS DTM: 20151116051809
MDC IDC SET LEADCHNL LV PACING PULSEWIDTH: 0.5 ms
MDC IDC SET LEADCHNL RA PACING AMPLITUDE: 1.5 V
MDC IDC SET LEADCHNL RV PACING AMPLITUDE: 2.75 V
MDC IDC SET ZONE DETECTION INTERVAL: 360 ms
MDC IDC SET ZONE DETECTION INTERVAL: 360 ms
MDC IDC STAT BRADY AP VP PERCENT: 15.02 %
MDC IDC STAT BRADY AS VS PERCENT: 2.11 %
Zone Setting Detection Interval: 270 ms
Zone Setting Detection Interval: 350 ms

## 2014-03-17 ENCOUNTER — Encounter: Payer: Self-pay | Admitting: Cardiology

## 2014-03-22 ENCOUNTER — Other Ambulatory Visit: Payer: Self-pay | Admitting: Internal Medicine

## 2014-04-01 ENCOUNTER — Encounter (HOSPITAL_COMMUNITY): Payer: Self-pay | Admitting: Internal Medicine

## 2014-04-21 ENCOUNTER — Other Ambulatory Visit: Payer: Self-pay | Admitting: *Deleted

## 2014-04-21 MED ORDER — LOVASTATIN 40 MG PO TABS: 40.0000 mg | ORAL_TABLET | Freq: Every day | ORAL | Status: DC

## 2014-04-24 ENCOUNTER — Other Ambulatory Visit: Payer: Self-pay | Admitting: Internal Medicine

## 2014-04-24 ENCOUNTER — Other Ambulatory Visit: Payer: Self-pay | Admitting: Physician Assistant

## 2014-04-26 DIAGNOSIS — I5022 Chronic systolic (congestive) heart failure: Secondary | ICD-10-CM | POA: Diagnosis not present

## 2014-04-26 DIAGNOSIS — H109 Unspecified conjunctivitis: Secondary | ICD-10-CM | POA: Diagnosis not present

## 2014-04-26 DIAGNOSIS — J40 Bronchitis, not specified as acute or chronic: Secondary | ICD-10-CM | POA: Diagnosis not present

## 2014-04-28 DIAGNOSIS — H44131 Sympathetic uveitis, right eye: Secondary | ICD-10-CM | POA: Diagnosis not present

## 2014-05-05 DIAGNOSIS — H44131 Sympathetic uveitis, right eye: Secondary | ICD-10-CM | POA: Diagnosis not present

## 2014-05-12 ENCOUNTER — Other Ambulatory Visit: Payer: Self-pay

## 2014-05-12 MED ORDER — LOVASTATIN 40 MG PO TABS
40.0000 mg | ORAL_TABLET | Freq: Every day | ORAL | Status: DC
Start: 2014-05-12 — End: 2014-12-19

## 2014-05-22 ENCOUNTER — Other Ambulatory Visit: Payer: Self-pay | Admitting: Cardiovascular Disease

## 2014-05-25 DIAGNOSIS — H35363 Drusen (degenerative) of macula, bilateral: Secondary | ICD-10-CM | POA: Diagnosis not present

## 2014-05-25 DIAGNOSIS — H44113 Panuveitis, bilateral: Secondary | ICD-10-CM | POA: Diagnosis not present

## 2014-05-31 ENCOUNTER — Other Ambulatory Visit (HOSPITAL_COMMUNITY): Payer: Self-pay

## 2014-05-31 DIAGNOSIS — D509 Iron deficiency anemia, unspecified: Secondary | ICD-10-CM

## 2014-05-31 DIAGNOSIS — D649 Anemia, unspecified: Secondary | ICD-10-CM

## 2014-06-01 ENCOUNTER — Encounter (HOSPITAL_COMMUNITY): Payer: Medicare Other | Attending: Hematology and Oncology

## 2014-06-01 DIAGNOSIS — K909 Intestinal malabsorption, unspecified: Secondary | ICD-10-CM | POA: Diagnosis not present

## 2014-06-01 DIAGNOSIS — D509 Iron deficiency anemia, unspecified: Secondary | ICD-10-CM | POA: Diagnosis not present

## 2014-06-01 DIAGNOSIS — D649 Anemia, unspecified: Secondary | ICD-10-CM | POA: Insufficient documentation

## 2014-06-01 DIAGNOSIS — H44113 Panuveitis, bilateral: Secondary | ICD-10-CM | POA: Diagnosis not present

## 2014-06-01 LAB — CBC
HCT: 31.2 % — ABNORMAL LOW (ref 36.0–46.0)
HEMOGLOBIN: 9.4 g/dL — AB (ref 12.0–15.0)
MCH: 24 pg — AB (ref 26.0–34.0)
MCHC: 30.1 g/dL (ref 30.0–36.0)
MCV: 79.6 fL (ref 78.0–100.0)
Platelets: 302 10*3/uL (ref 150–400)
RBC: 3.92 MIL/uL (ref 3.87–5.11)
RDW: 19.4 % — ABNORMAL HIGH (ref 11.5–15.5)
WBC: 5.6 10*3/uL (ref 4.0–10.5)

## 2014-06-01 LAB — FERRITIN: FERRITIN: 152 ng/mL (ref 10–291)

## 2014-06-01 NOTE — Progress Notes (Signed)
Labs for cbcd,ferr 

## 2014-06-03 ENCOUNTER — Encounter (HOSPITAL_BASED_OUTPATIENT_CLINIC_OR_DEPARTMENT_OTHER): Payer: Medicare Other | Admitting: Hematology & Oncology

## 2014-06-03 ENCOUNTER — Encounter (HOSPITAL_BASED_OUTPATIENT_CLINIC_OR_DEPARTMENT_OTHER): Payer: Medicare Other

## 2014-06-03 VITALS — BP 175/74 | HR 86 | Temp 98.9°F | Resp 20 | Wt 143.5 lb

## 2014-06-03 DIAGNOSIS — D509 Iron deficiency anemia, unspecified: Secondary | ICD-10-CM

## 2014-06-03 DIAGNOSIS — D649 Anemia, unspecified: Secondary | ICD-10-CM

## 2014-06-03 DIAGNOSIS — N189 Chronic kidney disease, unspecified: Secondary | ICD-10-CM | POA: Diagnosis not present

## 2014-06-03 DIAGNOSIS — K909 Intestinal malabsorption, unspecified: Secondary | ICD-10-CM | POA: Diagnosis not present

## 2014-06-03 LAB — SEDIMENTATION RATE: Sed Rate: 88 mm/hr — ABNORMAL HIGH (ref 0–22)

## 2014-06-03 LAB — RETICULOCYTES
RBC.: 3.83 MIL/uL — ABNORMAL LOW (ref 3.87–5.11)
Retic Count, Absolute: 99.6 10*3/uL (ref 19.0–186.0)
Retic Ct Pct: 2.6 % (ref 0.4–3.1)

## 2014-06-03 LAB — FOLATE: Folate: 10.1 ng/mL

## 2014-06-03 LAB — C-REACTIVE PROTEIN: CRP: 4.6 mg/dL — ABNORMAL HIGH (ref <0.60)

## 2014-06-03 LAB — VITAMIN B12: VITAMIN B 12: 713 pg/mL (ref 211–911)

## 2014-06-03 LAB — LACTATE DEHYDROGENASE: LDH: 194 U/L (ref 94–250)

## 2014-06-03 NOTE — Progress Notes (Signed)
[place], MD [address] [place] [address]    DIAGNOSIS: Iron deficiency anemia, microcytic anemia with hgb 8.5, mcv 70 on 09/02/2013 Carcinoma of the Left breast diagnosed in 1992, modified radical mastectomy, 5 years tamoxifen, no XRT CKD, stage II mild No record of recent EGD/C-scope Fecal occult blood 07/03/13 negative  CURRENT THERAPY: IV iron  INTERVAL HISTORY: [name] 75 y.o. female returns for follow-up of a history of carcinoma the left breast diagnosed back in 1992. She also has an anemia for which she has received IV iron. In spite of IV iron replacement she has remained anemic with her last hemoglobin at 9.4. At presentation in early 2015 she had a microcytic anemia and documented iron deficiency. She complains of fatigue but otherwise has no other major concerns.  MEDICAL HISTORY: Past Medical History  Diagnosis Date  . Arteriosclerotic cardiovascular disease (ASCVD)     Remote PTCA by patient report; LBBB; associated cardiomyopathy, presumed ischemic with EF 40-45% previously, 20% in 06/2009; h/o clinical congestive heart failure; negative stress nuclear in 2009 with inferoseptal and apical scar  . Anemia     Hgb of 9-10  . Hyperlipidemia   . Breast cancer   . Hypertension   . Automatic implantable cardioverter-defibrillator in situ   . HOH (hard of hearing)     has HYPERLIPIDEMIA; Chronic systolic heart failure; Anemia; Breast cancer; Hypertension; Biventricular ICD (implantable cardioverter-defibrillator) in place; Hypokalemia; Pain and swelling of wrist; Chest pain; Rheumatoid arthritis; HCAP (healthcare-associated pneumonia); Sepsis; ARF (acute renal failure); PNA (pneumonia); Malnutrition of moderate degree; and Iron deficiency anemia on her problem list.     has No Known Allergies.  Ms. Chene had no medications administered during this visit.  SURGICAL HISTORY: Past Surgical History  Procedure Laterality Date  . Total knee  arthroplasty Right     Dr.Harrison  . Bi-ventricular implantable cardioverter defibrillator  (crt-d)  07/28/2012  . Cataract extraction w/ intraocular lens implant Left   . Tubal ligation    . Breast biopsy Bilateral   . Mastectomy Left 1998  . Cataract extraction w/phaco Right 09/15/2013    Procedure: CATARACT EXTRACTION PHACO AND INTRAOCULAR LENS PLACEMENT (IOC);  Surgeon: Elta Guadeloupe T. Gershon Crane, MD;  Location: [facility];  Service: Ophthalmology;  Laterality: Right;  CDE:  10.74  . Bi-ventricular implantable cardioverter defibrillator N/A 07/28/2012    Procedure: BI-VENTRICULAR IMPLANTABLE CARDIOVERTER DEFIBRILLATOR  (CRT-D);  Surgeon: Evans Lance, MD;  Location: [facility] C[facility] LAB;  Service: Cardiovascular;  Laterality: N/A;    SOCIAL HISTORY: History   Social History  . Marital Status: Married    Spouse Name: N/A  . Number of Children: N/A  . Years of Education: N/A   Occupational History  . Retired    Social History Main Topics  . Smoking status: Former Smoker    Types: Cigarettes  . Smokeless tobacco: Current User    Types: Chew     Comment: 07/03/2013 "smoked some; don't know how much or how long or when I quit"  . Alcohol Use: No  . Drug Use: No  . Sexual Activity: Not Currently   Other Topics Concern  . Not on file   Social History Narrative    FAMILY HISTORY: Family History  Problem Relation Age of Onset  . Diabetes Father   . Cancer Father   . Hypertension Brother     Review of Systems  Constitutional: Positive for malaise/fatigue. Negative for fever, chills and weight loss.  HENT: Negative for  congestion, hearing loss, nosebleeds, sore throat and tinnitus.   Eyes: Negative for blurred vision, double vision, pain and discharge.  Respiratory: Negative for cough, hemoptysis, sputum production, shortness of breath and wheezing.   Cardiovascular: Negative for chest pain, palpitations, claudication, leg swelling and PND.  Gastrointestinal: Negative for heartburn, nausea,  vomiting, abdominal pain, diarrhea, constipation, blood in stool and melena.  Genitourinary: Negative for dysuria, urgency, frequency and hematuria.  Musculoskeletal: Negative for myalgias, joint pain and falls.  Skin: Negative for itching and rash.  Neurological: Negative for dizziness, tingling, tremors, sensory change, speech change, focal weakness, seizures, loss of consciousness, weakness and headaches.  Endo/Heme/Allergies: Does not bruise/bleed easily.  Psychiatric/Behavioral: Negative for depression, suicidal ideas, memory loss and substance abuse. The patient is not nervous/anxious and does not have insomnia.     PHYSICAL EXAMINATION  ECOG PERFORMANCE STATUS: 1 - Symptomatic but completely ambulatory  Filed Vitals:   06/03/14 1100  BP: 175/74  Pulse: 86  Temp: 98.9 F (37.2 C)  Resp: 20    Physical Exam  Constitutional: She is oriented to person, place, and time and well-developed, well-nourished, and in no distress.  HENT:  Head: Normocephalic and atraumatic.  Nose: Nose normal.  Mouth/Throat: Oropharynx is clear and moist. No oropharyngeal exudate.  Eyes: Conjunctivae and EOM are normal. Pupils are equal, round, and reactive to light. Right eye exhibits no discharge. Left eye exhibits no discharge. No scleral icterus.  Neck: Normal range of motion. Neck supple. No tracheal deviation present. No thyromegaly present.  Cardiovascular: Normal rate, regular rhythm and normal heart sounds.  Exam reveals no gallop and no friction rub.   No murmur heard. Pulmonary/Chest: Effort normal and breath sounds normal. She has no wheezes. She has no rales.  Abdominal: Soft. Bowel sounds are normal. She exhibits no distension and no mass. There is no tenderness. There is no rebound and no guarding.  Musculoskeletal: Normal range of motion. She exhibits no edema.  Lymphadenopathy:    She has no cervical adenopathy.  Neurological: She is alert and oriented to person, place, and time. She  has normal reflexes. No cranial nerve deficit. Gait normal. Coordination normal.  Skin: Skin is warm and dry. No rash noted.  Psychiatric: Mood, memory, affect and judgment normal.  Nursing note and vitals reviewed.   LABORATORY DATA:  CBC    Component Value Date/Time   WBC 5.6 06/01/2014 1056   RBC 3.83* 06/03/2014 1216   RBC 3.92 06/01/2014 1056   HGB 9.4* 06/01/2014 1056   HCT 31.2* 06/01/2014 1056   PLT 302 06/01/2014 1056   MCV 79.6 06/01/2014 1056   MCH 24.0* 06/01/2014 1056   MCHC 30.1 06/01/2014 1056   RDW 19.4* 06/01/2014 1056   LYMPHSABS 1.9 03/02/2014 0927   MONOABS 0.8 03/02/2014 0927   EOSABS 0.1 03/02/2014 0927   BASOSABS 0.0 03/02/2014 0927   CMP     Component Value Date/Time   NA 142 09/11/2013 1005   K 3.8 09/11/2013 1005   CL 104 09/11/2013 1005   CO2 27 09/11/2013 1005   GLUCOSE 87 09/11/2013 1005   BUN 19 09/11/2013 1005   CREATININE 0.76 09/11/2013 1005   CREATININE 0.94 09/09/2013 1305   CALCIUM 9.3 09/11/2013 1005   PROT 7.0 06/03/2014 1216   ALBUMIN 2.8* 08/19/2013 1016   AST 17 08/19/2013 1016   ALT 10 08/19/2013 1016   ALKPHOS 103 08/19/2013 1016   BILITOT 0.2* 08/19/2013 1016   GFRNONAA 58* 09/09/2013 1305   GFRAA 68* 09/09/2013  70       ASSESSMENT and THERAPY PLAN:    Iron deficiency anemia 74 year old female who presented with microcytic anemia and iron deficiency. In spite of iron replacement she remains quite anemic. She has evidence of only mild chronic kidney disease. Fecal occult blood was negative back in March 2015. There is no recent colonoscopy or EGD documented in Epic. I recommended a more extensive anemia evaluation to which she agrees. We will draw additional laboratory studies today and I will keep her apprised results when available. If all of her laboratory studies are normal we will discuss additional valuation at her follow-up when she returns in 2 months.   All questions were answered. The patient knows to call  the clinic with any problems, questions or concerns. We can certainly see the patient much sooner if necessary.  Molli Hazard 06/21/2014

## 2014-06-03 NOTE — Patient Instructions (Signed)
[place] at [facility] Discharge Instructions  RECOMMENDATIONS MADE BY THE CONSULTANT AND ANY TEST RESULTS WILL BE SENT TO YOUR REFERRING PHYSICIAN.  Lab work today. We will call you if there are any abnormal results. Return to clinic in 8 weeks for lab work and MD appointment. Report any issues/concerns to clinic as needed prior to appointments.  Thank you for choosing [facility] to provide your oncology and hematology care.  To afford each patient quality time with our provider, please arrive at least 15 minutes before your scheduled appointment time.    You need to re-schedule your appointment should you arrive 10 or more minutes late.  We strive to give you quality time with our providers, and arriving late affects you and other patients whose appointments are after yours.  Also, if you no show three or more times for appointments you may be dismissed from the clinic at the providers discretion.     Again, thank you for choosing [facility].  Our hope is that these requests will decrease the amount of time that you wait before being seen by our physicians.       _____________________________________________________________  Should you have questions after your visit to [facility], please contact our office at [phone] between the hours of 8:30 a.m. and 4:30 p.m.  Voicemails left after 4:30 p.m. will not be returned until the following business day.  For prescription refill requests, have your pharmacy contact our office.

## 2014-06-03 NOTE — Progress Notes (Signed)
LABS DRAWN

## 2014-06-04 LAB — HAPTOGLOBIN: Haptoglobin: 312 mg/dL — ABNORMAL HIGH (ref 34–200)

## 2014-06-07 LAB — MULTIPLE MYELOMA PANEL, SERUM
Albumin ELP: 44.6 % — ABNORMAL LOW (ref 55.8–66.1)
Alpha-1-Globulin: 5.7 % — ABNORMAL HIGH (ref 2.9–4.9)
Alpha-2-Globulin: 15.2 % — ABNORMAL HIGH (ref 7.1–11.8)
BETA 2: 6.3 % (ref 3.2–6.5)
Beta Globulin: 4.1 % — ABNORMAL LOW (ref 4.7–7.2)
GAMMA GLOBULIN: 24.1 % — AB (ref 11.1–18.8)
IgA: 503 mg/dL — ABNORMAL HIGH (ref 69–380)
IgG (Immunoglobin G), Serum: 1760 mg/dL — ABNORMAL HIGH (ref 690–1700)
IgM, Serum: 368 mg/dL — ABNORMAL HIGH (ref 52–322)
M-SPIKE, %: NOT DETECTED g/dL
Total Protein: 7 g/dL (ref 6.0–8.3)

## 2014-06-08 ENCOUNTER — Ambulatory Visit (INDEPENDENT_AMBULATORY_CARE_PROVIDER_SITE_OTHER): Payer: Medicare HMO | Admitting: *Deleted

## 2014-06-08 DIAGNOSIS — I255 Ischemic cardiomyopathy: Secondary | ICD-10-CM

## 2014-06-08 DIAGNOSIS — I5022 Chronic systolic (congestive) heart failure: Secondary | ICD-10-CM

## 2014-06-08 LAB — MDC_IDC_ENUM_SESS_TYPE_REMOTE
Brady Statistic AP VP Percent: 23.4 %
Brady Statistic AP VS Percent: 1.1 %
Brady Statistic AS VS Percent: 2.2 %
HighPow Impedance: 66 Ohm
Lead Channel Impedance Value: 342 Ohm
Lead Channel Impedance Value: 437 Ohm
Lead Channel Pacing Threshold Amplitude: 1.25 V
Lead Channel Pacing Threshold Pulse Width: 0.4 ms
Lead Channel Pacing Threshold Pulse Width: 0.5 ms
Lead Channel Sensing Intrinsic Amplitude: 20 mV
Lead Channel Setting Pacing Amplitude: 1.5 V
Lead Channel Setting Pacing Amplitude: 3.25 V
Lead Channel Setting Pacing Pulse Width: 0.4 ms
Lead Channel Setting Sensing Sensitivity: 0.3 mV
MDC IDC MSMT BATTERY REMAINING LONGEVITY: 91 mo
MDC IDC MSMT LEADCHNL LV IMPEDANCE VALUE: 513 Ohm
MDC IDC MSMT LEADCHNL LV PACING THRESHOLD AMPLITUDE: 0.5 V
MDC IDC MSMT LEADCHNL RA PACING THRESHOLD AMPLITUDE: 0.5 V
MDC IDC MSMT LEADCHNL RA PACING THRESHOLD PULSEWIDTH: 0.4 ms
MDC IDC MSMT LEADCHNL RA SENSING INTR AMPL: 2.3 mV
MDC IDC SET LEADCHNL LV PACING AMPLITUDE: 1.75 V
MDC IDC SET LEADCHNL LV PACING PULSEWIDTH: 0.5 ms
MDC IDC SET ZONE DETECTION INTERVAL: 270 ms
MDC IDC SET ZONE DETECTION INTERVAL: 350 ms
MDC IDC STAT BRADY AS VP PERCENT: 73.3 %
Zone Setting Detection Interval: 360 ms
Zone Setting Detection Interval: 360 ms

## 2014-06-08 NOTE — Progress Notes (Signed)
Remote ICD transmission.   

## 2014-06-14 DIAGNOSIS — H44113 Panuveitis, bilateral: Secondary | ICD-10-CM | POA: Diagnosis not present

## 2014-06-14 DIAGNOSIS — H35363 Drusen (degenerative) of macula, bilateral: Secondary | ICD-10-CM | POA: Diagnosis not present

## 2014-06-18 ENCOUNTER — Telehealth: Payer: Self-pay | Admitting: *Deleted

## 2014-06-18 NOTE — Telephone Encounter (Signed)
Informed patient that GT wants her to increase her Lasix to 80 mg BID x 3 days and then decrease it back to 40 mg twice daily. Patient voiced understanding although there were signs of confusion. She gave the phone to her nephew and I repeated GT's orders to him. He repeated the orders to me and voiced understanding. I encouraged him to call back with questions.

## 2014-06-21 ENCOUNTER — Encounter (HOSPITAL_COMMUNITY): Payer: Self-pay | Admitting: Hematology & Oncology

## 2014-06-21 NOTE — Assessment & Plan Note (Signed)
74 year old female who presented with microcytic anemia and iron deficiency. In spite of iron replacement she remains quite anemic. She has evidence of only mild chronic kidney disease. Fecal occult blood was negative back in March 2015. There is no recent colonoscopy or EGD documented in Epic. I recommended a more extensive anemia evaluation to which she agrees. We will draw additional laboratory studies today and I will keep her apprised results when available. If all of her laboratory studies are normal we will discuss additional valuation at her follow-up when she returns in 2 months.

## 2014-06-22 ENCOUNTER — Other Ambulatory Visit: Payer: Self-pay | Admitting: Cardiovascular Disease

## 2014-06-28 ENCOUNTER — Encounter: Payer: Self-pay | Admitting: Internal Medicine

## 2014-07-08 NOTE — Telephone Encounter (Signed)
I spoke with the patient's nephew, [name]. I explained ICM clinic to him and the patient will be enrolled. Since she is hard of hearing, he states it is best for me to call him. I have advised him to have the patient start weighing and do this appropriately.

## 2014-07-08 NOTE — Telephone Encounter (Signed)
I attempted to call the patient. She is very hard of hearing. She asks that I call her nephew, [name] at [phone] and discuss this with him. I have left him a message to call regarding ICM enrollment.

## 2014-07-22 ENCOUNTER — Other Ambulatory Visit: Payer: Self-pay | Admitting: Cardiovascular Disease

## 2014-07-23 DIAGNOSIS — I10 Essential (primary) hypertension: Secondary | ICD-10-CM | POA: Diagnosis not present

## 2014-07-23 DIAGNOSIS — M069 Rheumatoid arthritis, unspecified: Secondary | ICD-10-CM | POA: Diagnosis not present

## 2014-07-23 DIAGNOSIS — I5022 Chronic systolic (congestive) heart failure: Secondary | ICD-10-CM | POA: Diagnosis not present

## 2014-07-23 DIAGNOSIS — J40 Bronchitis, not specified as acute or chronic: Secondary | ICD-10-CM | POA: Diagnosis not present

## 2014-07-26 DIAGNOSIS — M1A00X0 Idiopathic chronic gout, unspecified site, without tophus (tophi): Secondary | ICD-10-CM | POA: Diagnosis not present

## 2014-07-26 DIAGNOSIS — D649 Anemia, unspecified: Secondary | ICD-10-CM | POA: Diagnosis not present

## 2014-07-26 DIAGNOSIS — Z79899 Other long term (current) drug therapy: Secondary | ICD-10-CM | POA: Diagnosis not present

## 2014-07-26 DIAGNOSIS — M2550 Pain in unspecified joint: Secondary | ICD-10-CM | POA: Diagnosis not present

## 2014-07-26 DIAGNOSIS — M19041 Primary osteoarthritis, right hand: Secondary | ICD-10-CM | POA: Diagnosis not present

## 2014-07-26 DIAGNOSIS — M0579 Rheumatoid arthritis with rheumatoid factor of multiple sites without organ or systems involvement: Secondary | ICD-10-CM | POA: Diagnosis not present

## 2014-07-30 ENCOUNTER — Encounter (HOSPITAL_COMMUNITY): Payer: Medicare Other | Attending: Hematology and Oncology

## 2014-07-30 DIAGNOSIS — N189 Chronic kidney disease, unspecified: Secondary | ICD-10-CM | POA: Diagnosis not present

## 2014-07-30 DIAGNOSIS — D509 Iron deficiency anemia, unspecified: Secondary | ICD-10-CM | POA: Insufficient documentation

## 2014-07-30 DIAGNOSIS — D649 Anemia, unspecified: Secondary | ICD-10-CM | POA: Diagnosis not present

## 2014-07-30 DIAGNOSIS — K909 Intestinal malabsorption, unspecified: Secondary | ICD-10-CM | POA: Diagnosis not present

## 2014-07-30 DIAGNOSIS — Z853 Personal history of malignant neoplasm of breast: Secondary | ICD-10-CM

## 2014-07-30 LAB — CBC WITH DIFFERENTIAL/PLATELET
Basophils Absolute: 0 10*3/uL (ref 0.0–0.1)
Basophils Relative: 0 % (ref 0–1)
EOS ABS: 0.1 10*3/uL (ref 0.0–0.7)
EOS PCT: 1 % (ref 0–5)
HEMATOCRIT: 29.4 % — AB (ref 36.0–46.0)
HEMOGLOBIN: 9.3 g/dL — AB (ref 12.0–15.0)
Lymphocytes Relative: 18 % (ref 12–46)
Lymphs Abs: 1.4 10*3/uL (ref 0.7–4.0)
MCH: 24.9 pg — AB (ref 26.0–34.0)
MCHC: 31.6 g/dL (ref 30.0–36.0)
MCV: 78.6 fL (ref 78.0–100.0)
MONOS PCT: 11 % (ref 3–12)
Monocytes Absolute: 0.9 10*3/uL (ref 0.1–1.0)
Neutro Abs: 5.4 10*3/uL (ref 1.7–7.7)
Neutrophils Relative %: 70 % (ref 43–77)
Platelets: 307 10*3/uL (ref 150–400)
RBC: 3.74 MIL/uL — AB (ref 3.87–5.11)
RDW: 18.7 % — ABNORMAL HIGH (ref 11.5–15.5)
WBC: 7.7 10*3/uL (ref 4.0–10.5)

## 2014-07-30 LAB — IRON AND TIBC
IRON: 31 ug/dL — AB (ref 42–145)
Saturation Ratios: 17 % — ABNORMAL LOW (ref 20–55)
TIBC: 184 ug/dL — ABNORMAL LOW (ref 250–470)
UIBC: 153 ug/dL (ref 125–400)

## 2014-07-30 LAB — FERRITIN: Ferritin: 159 ng/mL (ref 10–291)

## 2014-07-30 NOTE — Progress Notes (Signed)
Labs drawn

## 2014-08-02 ENCOUNTER — Ambulatory Visit (HOSPITAL_COMMUNITY): Payer: Medicare HMO | Admitting: Hematology & Oncology

## 2014-08-02 ENCOUNTER — Ambulatory Visit (HOSPITAL_COMMUNITY): Payer: Medicare Other | Admitting: Hematology & Oncology

## 2014-08-04 NOTE — Progress Notes (Signed)
[place], MD [place] [address]  Anemia, unspecified anemia type - Plan: PROAIR HFA 108 (90 BASE) MCG/ACT inhaler, CBC with Differential, Iron and TIBC, Ferritin, Reticulocytes  Anemia of chronic disease  Rheumatoid arthritis  CURRENT THERAPY: Observation.  INTERVAL HISTORY: [name] 74 y.o. female returns for followup of carcinoma the left breast diagnosed back in 1992. She also has an anemia for which she has received IV iron. In spite of IV iron replacement she has remained anemic.  I personally reviewed and went over laboratory results with the patient.  The results are noted within this dictation.  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  With this, there are some indications of an inflammatory process that is likely affecting her Hgb production, such an an anemia of chronic disease (ie RA for which she is on plaquenil).   She reports some tiredness and fatigue in the AM that improves as the day progresses.    Hematologically, she otherwise denies any complaints and ROS questioning is negative.  Past Medical History  Diagnosis Date  . Arteriosclerotic cardiovascular disease (ASCVD)     Remote PTCA by patient report; LBBB; associated cardiomyopathy, presumed ischemic with EF 40-45% previously, 20% in 06/2009; h/o clinical congestive heart failure; negative stress nuclear in 2009 with inferoseptal and apical scar  . Anemia     Hgb of 9-10  . Hyperlipidemia   . Breast cancer   . Hypertension   . Automatic implantable cardioverter-defibrillator in situ   . HOH (hard of hearing)   . Anemia of chronic disease     Hgb of 9-10 chronically; 06/2010: H&H-10.7/33.5, MCV-81, normal iron studies in 2010     has HYPERLIPIDEMIA; Chronic systolic heart failure; Anemia of chronic disease; Breast cancer; Hypertension; Biventricular ICD (implantable cardioverter-defibrillator)  in place; Hypokalemia; Pain and swelling of wrist; Chest pain; Rheumatoid arthritis; HCAP (healthcare-associated pneumonia); Sepsis; ARF (acute renal failure); PNA (pneumonia); Malnutrition of moderate degree; and Iron deficiency anemia on her problem list.     has No Known Allergies.  [name] does not currently have medications on file.  Past Surgical History  Procedure Laterality Date  . Total knee arthroplasty Right     Dr.Harrison  . Bi-ventricular implantable cardioverter defibrillator  (crt-d)  07/28/2012  . Cataract extraction w/ intraocular lens implant Left   . Tubal ligation    . Breast biopsy Bilateral   . Mastectomy Left 1998  . Cataract extraction w/phaco Right 09/15/2013    Procedure: CATARACT EXTRACTION PHACO AND INTRAOCULAR LENS PLACEMENT (IOC);  Surgeon: Elta Guadeloupe T. Gershon Crane, MD;  Location: [facility];  Service: Ophthalmology;  Laterality: Right;  CDE:  10.74  . Bi-ventricular implantable cardioverter defibrillator N/A 07/28/2012    Procedure: BI-VENTRICULAR IMPLANTABLE CARDIOVERTER DEFIBRILLATOR  (CRT-D);  Surgeon: Evans Lance, MD;  Location: [facility] CATH LAB;  Service: Cardiovascular;  Laterality: N/A;    Denies any headaches, dizziness, double vision, fevers, chills, night sweats, nausea, vomiting, diarrhea, constipation, chest pain, heart palpitations, shortness of breath, blood in stool, black tarry stool, urinary pain, urinary burning, urinary frequency, hematuria.   PHYSICAL EXAMINATION  ECOG PERFORMANCE STATUS: 1 - Symptomatic but completely ambulatory  Filed Vitals:   08/05/14 1007  BP: 149/61  Pulse: 92  Temp: 99 F (37.2 C)  Resp: 16    GENERAL:alert, no distress, well nourished, well developed, comfortable, cooperative, smiling and hard of hearing SKIN: skin color,  texture, turgor are normal, no rashes or significant lesions HEAD: Normocephalic, No masses, lesions, tenderness or abnormalities EYES: normal, PERRLA, EOMI, Conjunctiva are pink and  non-injected EARS: External ears normal OROPHARYNX:lips, buccal mucosa, and tongue normal and mucous membranes are moist  NECK: supple, thyroid normal size, non-tender, without nodularity, no stridor, non-tender, trachea midline LYMPH:  no palpable lymphadenopathy BREAST:not examined LUNGS: clear to auscultation  HEART: regular rate & rhythm, no murmurs, no gallops, S1 normal, S2 normal and pacemaker in place and noted ABDOMEN:abdomen soft, non-tender, obese and normal bowel sounds BACK: Back symmetric, no curvature., No CVA tenderness EXTREMITIES:less then 2 second capillary refill, joint deformities with ulnar deviation associated with RA. NEURO: alert & oriented x 3 with fluent speech, no focal motor/sensory deficits   LABORATORY DATA: CBC    Component Value Date/Time   WBC 7.7 07/30/2014 1048   RBC 3.74* 07/30/2014 1048   RBC 3.83* 06/03/2014 1216   HGB 9.3* 07/30/2014 1048   HCT 29.4* 07/30/2014 1048   PLT 307 07/30/2014 1048   MCV 78.6 07/30/2014 1048   MCH 24.9* 07/30/2014 1048   MCHC 31.6 07/30/2014 1048   RDW 18.7* 07/30/2014 1048   LYMPHSABS 1.4 07/30/2014 1048   MONOABS 0.9 07/30/2014 1048   EOSABS 0.1 07/30/2014 1048   BASOSABS 0.0 07/30/2014 1048      Chemistry      Component Value Date/Time   NA 142 09/11/2013 1005   K 3.8 09/11/2013 1005   CL 104 09/11/2013 1005   CO2 27 09/11/2013 1005   BUN 19 09/11/2013 1005   CREATININE 0.76 09/11/2013 1005   CREATININE 0.94 09/09/2013 1305      Component Value Date/Time   CALCIUM 9.3 09/11/2013 1005   ALKPHOS 103 08/19/2013 1016   AST 17 08/19/2013 1016   ALT 10 08/19/2013 1016   BILITOT 0.2* 08/19/2013 1016      Lab Results  Component Value Date   IRON 31* 07/30/2014   TIBC 184* 07/30/2014   FERRITIN 159 07/30/2014      ASSESSMENT AND PLAN:  Anemia of chronic disease Multifactorial with a definite element of anemia of chronic disease secondary to rheumatoid arthritis and its treatment, anemia of  chronic renal disease, and iron deficiency anemia.    Work-up thus far demonstrates an anemia of chronic disease picture.  She is not symptomatic from an anemia standpoint and therefore ESA therapy is not indicated.  Labs in 3 and 6 months: CBC diff, iron/TIBC, ferritin, retic count  Return in 6 months for follow-up.   Rheumatoid arthritis Followed by Dr. Miachel Roux and currently on Plaquenil   THERAPY PLAN:  We will follow labs and if/when she becomes symptomatic of her anemia, ESA therapy can be considered.   All questions were answered. The patient knows to call the clinic with any problems, questions or concerns. We can certainly see the patient much sooner if necessary.  Patient and plan discussed with Dr. Ancil Linsey and she is in agreement with the aforementioned.   This note is electronically signed by: Robynn Pane 08/05/2014 10:55 AM

## 2014-08-04 NOTE — Assessment & Plan Note (Addendum)
Multifactorial with a definite element of anemia of chronic disease secondary to rheumatoid arthritis and its treatment, anemia of chronic renal disease, and iron deficiency anemia.    Work-up thus far demonstrates an anemia of chronic disease picture.  She is not symptomatic from an anemia standpoint and therefore ESA therapy is not indicated.  Labs in 3 and 6 months: CBC diff, iron/TIBC, ferritin, retic count  Return in 6 months for follow-up.

## 2014-08-05 ENCOUNTER — Encounter (HOSPITAL_COMMUNITY): Payer: Self-pay | Admitting: Oncology

## 2014-08-05 ENCOUNTER — Encounter (HOSPITAL_BASED_OUTPATIENT_CLINIC_OR_DEPARTMENT_OTHER): Payer: Medicare Other | Admitting: Oncology

## 2014-08-05 VITALS — BP 149/61 | HR 92 | Temp 99.0°F | Resp 16 | Wt 143.0 lb

## 2014-08-05 DIAGNOSIS — N189 Chronic kidney disease, unspecified: Secondary | ICD-10-CM

## 2014-08-05 DIAGNOSIS — M069 Rheumatoid arthritis, unspecified: Secondary | ICD-10-CM | POA: Diagnosis not present

## 2014-08-05 DIAGNOSIS — D631 Anemia in chronic kidney disease: Secondary | ICD-10-CM | POA: Diagnosis not present

## 2014-08-05 DIAGNOSIS — D638 Anemia in other chronic diseases classified elsewhere: Secondary | ICD-10-CM

## 2014-08-05 DIAGNOSIS — D509 Iron deficiency anemia, unspecified: Secondary | ICD-10-CM

## 2014-08-05 DIAGNOSIS — D649 Anemia, unspecified: Secondary | ICD-10-CM

## 2014-08-05 NOTE — Patient Instructions (Signed)
..  [facility] at [facility] Discharge Instructions  RECOMMENDATIONS MADE BY THE CONSULTANT AND ANY TEST RESULTS WILL BE SENT TO YOUR REFERRING PHYSICIAN.  Labs in 3 months and 6 months Return to see us in 6 months  Thank you for choosing [facility] at [facility] to provide your oncology and hematology care.  To afford each patient quality time with our provider, please arrive at least 15 minutes before your scheduled appointment time.    You need to re-schedule your appointment should you arrive 10 or more minutes late.  We strive to give you quality time with our providers, and arriving late affects you and other patients whose appointments are after yours.  Also, if you no show three or more times for appointments you may be dismissed from the clinic at the providers discretion.     Again, thank you for choosing [facility].  Our hope is that these requests will decrease the amount of time that you wait before being seen by our physicians.       _____________________________________________________________  Should you have questions after your visit to [facility], please contact our office at [phone] between the hours of 8:30 a.m. and 4:30 p.m.  Voicemails left after 4:30 p.m. will not be returned until the following business day.  For prescription refill requests, have your pharmacy contact our office.    

## 2014-08-05 NOTE — Assessment & Plan Note (Signed)
Followed by Dr. Miachel Roux and currently on Plaquenil

## 2014-08-09 ENCOUNTER — Telehealth: Payer: Self-pay | Admitting: *Deleted

## 2014-08-09 ENCOUNTER — Ambulatory Visit (INDEPENDENT_AMBULATORY_CARE_PROVIDER_SITE_OTHER): Payer: Medicare Other | Admitting: *Deleted

## 2014-08-09 DIAGNOSIS — I5022 Chronic systolic (congestive) heart failure: Secondary | ICD-10-CM

## 2014-08-09 NOTE — Telephone Encounter (Signed)
ICM transmission received. I left a message for the patient' nephew, [name], to call back.

## 2014-08-10 ENCOUNTER — Encounter: Payer: Self-pay | Admitting: *Deleted

## 2014-08-10 NOTE — Telephone Encounter (Signed)
[phone]  Pt nephew calling back.  Please call him.

## 2014-08-10 NOTE — Progress Notes (Signed)
EPIC Encounter for ICM Monitoring  Patient Name: [name] is a 74 y.o. female Date: 08/10/2014 Primary Care Physican: [name], MD Primary Cardiologist: [name] Electrophysiologist: Lovena Le Dry Weight: 137 lbs       In the past month, have you:  1. Gained more than 2 pounds in a day or more than 5 pounds in a week? I spoke with the patient's nephew, [name], per the patient's request as she is hard of hearing. He states she has been weighing daily and the highest her weight was recently was 140 lbs.   2. Had changes in your medications (with verification of current medications)? no  3. Had more shortness of breath than is usual for you? no  4. Limited your activity because of shortness of breath? no  5. Not been able to sleep because of shortness of breath? no  6. Had increased swelling in your feet or ankles? Some edema to her lower extremities.   7. Had symptoms of dehydration (dizziness, dry mouth, increased thirst, decreased urine output) no  8. Had changes in sodium restriction? no  9. Been compliant with medication? Yes   ICM trend:   Follow-up plan: ICM clinic phone appointment: 09/13/14. This is my first ICM encounter with the patient. Her Optivol readings were previously elevated in the fall and winter. Dr. Lovena Le increased her lasix to 80 mg BID x 3 days on 06/18/14. Her optivol readings did return to baseline after this, but started to rise again from ~ 07/15/14 to present. Her impedence does look to be returning to baseline. Since the patient has noticed some increase in her weight and some swelling recently, I have advised the patient's nephew to have her increase her lasix from 40 mg BID to 80 mg BID x 2 days, then return to her normal dosing. He voices understanding.   Copy of note sent to patient's primary care physician, primary cardiologist, and device following physician.  Alvis Lemmings, RN, BSN 08/10/2014 4:24 PM

## 2014-08-10 NOTE — Telephone Encounter (Signed)
I spoke with the patient's nephew, [name].

## 2014-08-28 ENCOUNTER — Encounter (HOSPITAL_COMMUNITY): Payer: Self-pay | Admitting: Emergency Medicine

## 2014-08-28 ENCOUNTER — Emergency Department (HOSPITAL_COMMUNITY): Payer: Medicare Other

## 2014-08-28 ENCOUNTER — Inpatient Hospital Stay (HOSPITAL_COMMUNITY)
Admission: EM | Admit: 2014-08-28 | Discharge: 2014-09-06 | DRG: 291 | Disposition: A | Discharge status: Home Health | Payer: Medicare Other | Attending: Internal Medicine | Admitting: Internal Medicine

## 2014-08-28 DIAGNOSIS — I25118 Atherosclerotic heart disease of native coronary artery with other forms of angina pectoris: Secondary | ICD-10-CM | POA: Diagnosis not present

## 2014-08-28 DIAGNOSIS — Z853 Personal history of malignant neoplasm of breast: Secondary | ICD-10-CM

## 2014-08-28 DIAGNOSIS — H9190 Unspecified hearing loss, unspecified ear: Secondary | ICD-10-CM | POA: Diagnosis present

## 2014-08-28 DIAGNOSIS — I5023 Acute on chronic systolic (congestive) heart failure: Secondary | ICD-10-CM | POA: Diagnosis not present

## 2014-08-28 DIAGNOSIS — Z809 Family history of malignant neoplasm, unspecified: Secondary | ICD-10-CM

## 2014-08-28 DIAGNOSIS — Z7982 Long term (current) use of aspirin: Secondary | ICD-10-CM

## 2014-08-28 DIAGNOSIS — E785 Hyperlipidemia, unspecified: Secondary | ICD-10-CM | POA: Diagnosis present

## 2014-08-28 DIAGNOSIS — I10 Essential (primary) hypertension: Secondary | ICD-10-CM | POA: Diagnosis not present

## 2014-08-28 DIAGNOSIS — Z8249 Family history of ischemic heart disease and other diseases of the circulatory system: Secondary | ICD-10-CM

## 2014-08-28 DIAGNOSIS — I5021 Acute systolic (congestive) heart failure: Secondary | ICD-10-CM | POA: Diagnosis not present

## 2014-08-28 DIAGNOSIS — J189 Pneumonia, unspecified organism: Secondary | ICD-10-CM | POA: Diagnosis not present

## 2014-08-28 DIAGNOSIS — Z95810 Presence of automatic (implantable) cardiac defibrillator: Secondary | ICD-10-CM

## 2014-08-28 DIAGNOSIS — Z9861 Coronary angioplasty status: Secondary | ICD-10-CM | POA: Diagnosis not present

## 2014-08-28 DIAGNOSIS — R0600 Dyspnea, unspecified: Secondary | ICD-10-CM | POA: Diagnosis present

## 2014-08-28 DIAGNOSIS — I509 Heart failure, unspecified: Secondary | ICD-10-CM

## 2014-08-28 DIAGNOSIS — Z9111 Patient's noncompliance with dietary regimen: Secondary | ICD-10-CM | POA: Diagnosis present

## 2014-08-28 DIAGNOSIS — D638 Anemia in other chronic diseases classified elsewhere: Secondary | ICD-10-CM

## 2014-08-28 DIAGNOSIS — I2510 Atherosclerotic heart disease of native coronary artery without angina pectoris: Secondary | ICD-10-CM | POA: Diagnosis not present

## 2014-08-28 DIAGNOSIS — Z9114 Patient's other noncompliance with medication regimen: Secondary | ICD-10-CM | POA: Diagnosis present

## 2014-08-28 DIAGNOSIS — Z96651 Presence of right artificial knee joint: Secondary | ICD-10-CM | POA: Diagnosis present

## 2014-08-28 DIAGNOSIS — N179 Acute kidney failure, unspecified: Secondary | ICD-10-CM | POA: Diagnosis not present

## 2014-08-28 DIAGNOSIS — I255 Ischemic cardiomyopathy: Secondary | ICD-10-CM | POA: Diagnosis present

## 2014-08-28 DIAGNOSIS — F17220 Nicotine dependence, chewing tobacco, uncomplicated: Secondary | ICD-10-CM | POA: Diagnosis present

## 2014-08-28 DIAGNOSIS — I5022 Chronic systolic (congestive) heart failure: Secondary | ICD-10-CM

## 2014-08-28 DIAGNOSIS — I5043 Acute on chronic combined systolic (congestive) and diastolic (congestive) heart failure: Secondary | ICD-10-CM | POA: Diagnosis not present

## 2014-08-28 DIAGNOSIS — J42 Unspecified chronic bronchitis: Secondary | ICD-10-CM | POA: Diagnosis not present

## 2014-08-28 DIAGNOSIS — Z833 Family history of diabetes mellitus: Secondary | ICD-10-CM | POA: Diagnosis not present

## 2014-08-28 DIAGNOSIS — R05 Cough: Secondary | ICD-10-CM | POA: Diagnosis not present

## 2014-08-28 DIAGNOSIS — Z9012 Acquired absence of left breast and nipple: Secondary | ICD-10-CM | POA: Diagnosis present

## 2014-08-28 DIAGNOSIS — I5189 Other ill-defined heart diseases: Secondary | ICD-10-CM | POA: Diagnosis present

## 2014-08-28 LAB — TROPONIN I
Troponin I: 0.03 ng/mL (ref <0.031)
Troponin I: 0.03 ng/mL (ref <0.031)

## 2014-08-28 LAB — CBC WITH DIFFERENTIAL/PLATELET
Basophils Absolute: 0 10*3/uL (ref 0.0–0.1)
Basophils Relative: 1 % (ref 0–1)
EOS ABS: 0.1 10*3/uL (ref 0.0–0.7)
Eosinophils Relative: 2 % (ref 0–5)
HCT: 30.3 % — ABNORMAL LOW (ref 36.0–46.0)
Hemoglobin: 9.4 g/dL — ABNORMAL LOW (ref 12.0–15.0)
Lymphocytes Relative: 21 % (ref 12–46)
Lymphs Abs: 1.6 10*3/uL (ref 0.7–4.0)
MCH: 24.2 pg — AB (ref 26.0–34.0)
MCHC: 31 g/dL (ref 30.0–36.0)
MCV: 78.1 fL (ref 78.0–100.0)
Monocytes Absolute: 0.8 10*3/uL (ref 0.1–1.0)
Monocytes Relative: 10 % (ref 3–12)
NEUTROS PCT: 66 % (ref 43–77)
Neutro Abs: 5.4 10*3/uL (ref 1.7–7.7)
PLATELETS: 295 10*3/uL (ref 150–400)
RBC: 3.88 MIL/uL (ref 3.87–5.11)
RDW: 18.2 % — AB (ref 11.5–15.5)
WBC: 8 10*3/uL (ref 4.0–10.5)

## 2014-08-28 LAB — CREATININE, SERUM
CREATININE: 0.72 mg/dL (ref 0.44–1.00)
GFR calc non Af Amer: >60 mL/min (ref >60)

## 2014-08-28 LAB — COMPREHENSIVE METABOLIC PANEL
ALBUMIN: 2.9 g/dL — AB (ref 3.5–5.0)
ALT: 13 U/L — ABNORMAL LOW (ref 14–54)
AST: 17 U/L (ref 15–41)
Alkaline Phosphatase: 97 U/L (ref 38–126)
Anion gap: 9 (ref 5–15)
BUN: 15 mg/dL (ref 6–20)
CO2: 29 mmol/L (ref 22–32)
CREATININE: 0.73 mg/dL (ref 0.44–1.00)
Calcium: 8.9 mg/dL (ref 8.9–10.3)
Chloride: 103 mmol/L (ref 101–111)
GFR calc non Af Amer: >60 mL/min (ref >60)
Glucose, Bld: 114 mg/dL — ABNORMAL HIGH (ref 70–99)
Potassium: 3.8 mmol/L (ref 3.5–5.1)
Sodium: 141 mmol/L (ref 135–145)
Total Bilirubin: 0.5 mg/dL (ref 0.3–1.2)
Total Protein: 8.2 g/dL — ABNORMAL HIGH (ref 6.5–8.1)

## 2014-08-28 LAB — CBC
HCT: 30.9 % — ABNORMAL LOW (ref 36.0–46.0)
Hemoglobin: 9.6 g/dL — ABNORMAL LOW (ref 12.0–15.0)
MCH: 24.2 pg — ABNORMAL LOW (ref 26.0–34.0)
MCHC: 31.1 g/dL (ref 30.0–36.0)
MCV: 78 fL (ref 78.0–100.0)
PLATELETS: 343 10*3/uL (ref 150–400)
RBC: 3.96 MIL/uL (ref 3.87–5.11)
RDW: 18.1 % — AB (ref 11.5–15.5)
WBC: 8 10*3/uL (ref 4.0–10.5)

## 2014-08-28 LAB — BRAIN NATRIURETIC PEPTIDE: B NATRIURETIC PEPTIDE 5: 1300 pg/mL — AB (ref 0.0–100.0)

## 2014-08-28 MED ORDER — CARVEDILOL 3.125 MG PO TABS
6.2500 mg | ORAL_TABLET | Freq: Two times a day (BID) | ORAL | Status: DC
  Administered 2014-08-29 – 2014-09-06 (×16): 6.25 mg via ORAL
  Filled 2014-08-28 (×17): qty 2

## 2014-08-28 MED ORDER — IPRATROPIUM-ALBUTEROL 0.5-2.5 (3) MG/3ML IN SOLN
3.0000 mL | Freq: Once | RESPIRATORY_TRACT | Status: AC
  Administered 2014-08-28: 3 mL via RESPIRATORY_TRACT
  Filled 2014-08-28: qty 3

## 2014-08-28 MED ORDER — ASPIRIN EC 325 MG PO TBEC
325.0000 mg | DELAYED_RELEASE_TABLET | Freq: Every day | ORAL | Status: DC
  Administered 2014-08-29 – 2014-08-30 (×2): 325 mg via ORAL
  Filled 2014-08-28 (×2): qty 1

## 2014-08-28 MED ORDER — HEPARIN SODIUM (PORCINE) 5000 UNIT/ML IJ SOLN
5000.0000 [IU] | Freq: Three times a day (TID) | INTRAMUSCULAR | Status: DC
  Administered 2014-08-28 – 2014-09-06 (×26): 5000 [IU] via SUBCUTANEOUS
  Filled 2014-08-28 (×24): qty 1

## 2014-08-28 MED ORDER — PRAVASTATIN SODIUM 40 MG PO TABS
40.0000 mg | ORAL_TABLET | Freq: Every day | ORAL | Status: DC
  Administered 2014-08-29 – 2014-09-05 (×8): 40 mg via ORAL
  Filled 2014-08-28 (×8): qty 1

## 2014-08-28 MED ORDER — ACETAMINOPHEN 500 MG PO TABS
1000.0000 mg | ORAL_TABLET | Freq: Four times a day (QID) | ORAL | Status: DC | PRN
  Administered 2014-08-30 – 2014-09-04 (×6): 1000 mg via ORAL
  Filled 2014-08-28 (×6): qty 2

## 2014-08-28 MED ORDER — ONDANSETRON HCL 4 MG PO TABS: 4.0000 mg | ORAL_TABLET | Freq: Four times a day (QID) | ORAL | Status: DC | PRN

## 2014-08-28 MED ORDER — FUROSEMIDE 10 MG/ML IJ SOLN
40.0000 mg | Freq: Two times a day (BID) | INTRAMUSCULAR | Status: DC
  Administered 2014-08-28 – 2014-08-30 (×4): 40 mg via INTRAVENOUS
  Filled 2014-08-28 (×4): qty 4

## 2014-08-28 MED ORDER — PANTOPRAZOLE SODIUM 40 MG PO TBEC
40.0000 mg | DELAYED_RELEASE_TABLET | Freq: Every day | ORAL | Status: DC
  Administered 2014-08-29 – 2014-09-06 (×9): 40 mg via ORAL
  Filled 2014-08-28 (×9): qty 1

## 2014-08-28 MED ORDER — FUROSEMIDE 10 MG/ML IJ SOLN
20.0000 mg | Freq: Once | INTRAMUSCULAR | Status: AC
  Administered 2014-08-28: 20 mg via INTRAVENOUS
  Filled 2014-08-28: qty 2

## 2014-08-28 MED ORDER — SODIUM CHLORIDE 0.9 % IJ SOLN
3.0000 mL | Freq: Two times a day (BID) | INTRAMUSCULAR | Status: DC
  Administered 2014-08-28 – 2014-09-05 (×17): 3 mL via INTRAVENOUS

## 2014-08-28 MED ORDER — HYDROXYCHLOROQUINE SULFATE 200 MG PO TABS
100.0000 mg | ORAL_TABLET | Freq: Every day | ORAL | Status: DC
  Administered 2014-08-29 – 2014-09-05 (×8): 100 mg via ORAL
  Filled 2014-08-28 (×11): qty 0.5

## 2014-08-28 MED ORDER — ONDANSETRON HCL 4 MG/2ML IJ SOLN
4.0000 mg | Freq: Four times a day (QID) | INTRAMUSCULAR | Status: DC | PRN
  Administered 2014-08-29: 4 mg via INTRAVENOUS
  Filled 2014-08-28: qty 2

## 2014-08-28 MED ORDER — HYDROXYCHLOROQUINE SULFATE 200 MG PO TABS
200.0000 mg | ORAL_TABLET | Freq: Every day | ORAL | Status: DC
  Administered 2014-08-29 – 2014-09-06 (×9): 200 mg via ORAL
  Filled 2014-08-28 (×11): qty 1

## 2014-08-28 MED ORDER — ALBUTEROL SULFATE (2.5 MG/3ML) 0.083% IN NEBU: 3.0000 mL | INHALATION_SOLUTION | RESPIRATORY_TRACT | Status: DC | PRN

## 2014-08-28 MED ORDER — ASPIRIN 81 MG PO CHEW
324.0000 mg | CHEWABLE_TABLET | Freq: Once | ORAL | Status: AC
  Administered 2014-08-28: 324 mg via ORAL
  Filled 2014-08-28: qty 4

## 2014-08-28 MED ORDER — LISINOPRIL 10 MG PO TABS
40.0000 mg | ORAL_TABLET | Freq: Every day | ORAL | Status: DC
  Administered 2014-08-29 – 2014-09-03 (×5): 40 mg via ORAL
  Filled 2014-08-28 (×6): qty 4

## 2014-08-28 NOTE — ED Notes (Signed)
Pt with dry hacking cough x 3 days. Denies fever. Denies vomiting or diarrhea.

## 2014-08-28 NOTE — ED Notes (Signed)
Pt O2 sats 89% when ambulating. HR 101

## 2014-08-28 NOTE — ED Notes (Signed)
Respiratory at bedside.

## 2014-08-28 NOTE — ED Notes (Addendum)
Placed pt on 2L [place] per MD Rancour.

## 2014-08-28 NOTE — ED Notes (Signed)
Phlebotomy at bedside.

## 2014-08-28 NOTE — ED Notes (Signed)
MD Rancour at bedside 

## 2014-08-28 NOTE — H&P (Signed)
[facility] History and Physical  [name] [id number] DOB: 19-Feb-1941 DOA: 08/28/2014  Referring physician: ER PCP: [name], MD   Chief Complaint: Cough, dyspnea  HPI: [name] is a 74 y.o. female  This is a 74 year old lady who gives a three-day history of cough productive of clear mucus but also associated dyspnea on exertion. She denies any palpitations. She does have some chest pain with coughing. She does have a history of chronic systolic congestive heart failure, last documented echocardiogram shows ejection fraction of 30-35%. She also has coronary artery disease and has had PTCA previously. She also has biventricular ICD. She also has a history of chronic microcytic anemia. She is now being admitted for further management.   Review of Systems:  Apart from symptoms above, all systems negative.   Past Medical History  Diagnosis Date  . Arteriosclerotic cardiovascular disease (ASCVD)     Remote PTCA by patient report; LBBB; associated cardiomyopathy, presumed ischemic with EF 40-45% previously, 20% in 06/2009; h/o clinical congestive heart failure; negative stress nuclear in 2009 with inferoseptal and apical scar  . Anemia     Hgb of 9-10  . Hyperlipidemia   . Breast cancer   . Hypertension   . Automatic implantable cardioverter-defibrillator in situ   . HOH (hard of hearing)   . Anemia of chronic disease     Hgb of 9-10 chronically; 06/2010: H&H-10.7/33.5, MCV-81, normal iron studies in 2010    Past Surgical History  Procedure Laterality Date  . Total knee arthroplasty Right     Dr.Harrison  . Bi-ventricular implantable cardioverter defibrillator  (crt-d)  07/28/2012  . Cataract extraction w/ intraocular lens implant Left   . Tubal ligation    . Breast biopsy Bilateral   . Mastectomy Left 1998  . Cataract extraction w/phaco Right 09/15/2013    Procedure: CATARACT EXTRACTION PHACO AND INTRAOCULAR LENS PLACEMENT (IOC);  Surgeon: Elta Guadeloupe T. Gershon Crane, MD;   Location: [facility];  Service: Ophthalmology;  Laterality: Right;  CDE:  10.74  . Bi-ventricular implantable cardioverter defibrillator N/A 07/28/2012    Procedure: BI-VENTRICULAR IMPLANTABLE CARDIOVERTER DEFIBRILLATOR  (CRT-D);  Surgeon: Evans Lance, MD;  Location: [facility] CATH LAB;  Service: Cardiovascular;  Laterality: N/A;   Social History:  reports that she has quit smoking. Her smoking use included Cigarettes. Her smokeless tobacco use includes Chew. She reports that she does not drink alcohol or use illicit drugs.  No Known Allergies  Family History  Problem Relation Age of Onset  . Diabetes Father   . Cancer Father   . Hypertension Brother     Prior to Admission medications   Medication Sig Start Date End Date Taking? Authorizing Provider  acetaminophen (TYLENOL) 500 MG tablet Take 1,000 mg by mouth every 6 (six) hours as needed.    Yes Historical Provider, MD  aspirin 325 MG EC tablet Take 325 mg by mouth daily.   Yes Historical Provider, MD  carvedilol (COREG) 6.25 MG tablet TAKE 1 TABLET (6.25 MG TOTAL) BY MOUTH 2 (TWO) TIMES DAILY WITH A MEAL. 01/20/14  Yes Herminio Commons, MD  furosemide (LASIX) 40 MG tablet TAKE 1 TABLET BY MOUTH TWICE A DAY 03/22/14  Yes Herminio Commons, MD  hydroxychloroquine (PLAQUENIL) 200 MG tablet Take 100-200 mg by mouth 2 (two) times daily. Takes a whole tablet in the morning and half a tablet in the evening . 10/25/13  Yes Historical Provider, MD  lisinopril (PRINIVIL,ZESTRIL) 40 MG tablet TAKE 1 TABLET BY MOUTH EVERY DAY  07/22/14  Yes Herminio Commons, MD  lovastatin (MEVACOR) 40 MG tablet Take 1 tablet (40 mg total) by mouth at bedtime. 05/12/14  Yes Evans Lance, MD  omeprazole (PRILOSEC) 20 MG capsule Take 20 mg by mouth daily. For gas/heartburn   Yes Historical Provider, MD  PROAIR HFA 108 (90 BASE) MCG/ACT inhaler Inhale 2 puffs into the lungs every 4 (four) hours as needed for wheezing or shortness of breath.  07/23/14  Yes Historical Provider,  MD  ibuprofen (ADVIL,MOTRIN) 200 MG tablet Take 1 tablet (200 mg total) by mouth every 6 (six) hours as needed for fever, headache, mild pain or moderate pain. Patient not taking: Reported on 08/28/2014 07/06/13   Modena Jansky, MD  nitroGLYCERIN (NITROSTAT) 0.4 MG SL tablet Place 1 tablet (0.4 mg total) under the tongue every 5 (five) minutes as needed for chest pain. Patient not taking: Reported on 06/03/2014 11/05/13   Herminio Commons, MD  potassium chloride (K-DUR) 10 MEQ tablet Take 1 tablet (10 mEq total) by mouth 2 (two) times daily. Patient not taking: Reported on 06/03/2014 07/06/13   Modena Jansky, MD  predniSONE (DELTASONE) 5 MG tablet 5 mg daily.  09/29/13   Historical Provider, MD   Physical Exam: Filed Vitals:   08/28/14 1530 08/28/14 1534 08/28/14 1730 08/28/14 1824  BP: 138/76  142/70 144/69  Pulse: 84  72 81  Temp:    99 F (37.2 C)  TempSrc:    Oral  Resp:   22 20  Height:    5\' 4"  (1.626 m)  Weight:    60.555 kg (133 lb 8 oz)  SpO2: 94% 93% 99% 93%    Wt Readings from Last 3 Encounters:  08/28/14 60.555 kg (133 lb 8 oz)  08/05/14 64.864 kg (143 lb)  06/03/14 65.091 kg (143 lb 8 oz)    General:  Appears calm and comfortable Eyes: PERRL, normal lids, irises & conjunctiva ENT: grossly normal hearing, lips & tongue Neck: no LAD, masses or thyromegaly Cardiovascular: Resting tachycardia. Telemetry: SR, no arrhythmias  Respiratory: Bilateral inspiratory crackles at the bases. Abdomen: soft, ntnd Skin: no rash or induration seen on limited exam Musculoskeletal: grossly normal tone BUE/BLE Psychiatric: grossly normal mood and affect, speech fluent and appropriate Neurologic: grossly non-focal.          Labs on Admission:  Basic Metabolic Panel:  Recent Labs Lab 08/28/14 1608  NA 141  K 3.8  CL 103  CO2 29  GLUCOSE 114*  BUN 15  CREATININE 0.73  CALCIUM 8.9   Liver Function Tests:  Recent Labs Lab 08/28/14 1608  AST 17  ALT 13*  ALKPHOS 97    BILITOT 0.5  PROT 8.2*  ALBUMIN 2.9*   No results for input(s): LIPASE, AMYLASE in the last 168 hours. No results for input(s): AMMONIA in the last 168 hours. CBC:  Recent Labs Lab 08/28/14 1608  WBC 8.0  NEUTROABS 5.4  HGB 9.4*  HCT 30.3*  MCV 78.1  PLT 295   Cardiac Enzymes:  Recent Labs Lab 08/28/14 1608  TROPONINI 0.03    BNP (last 3 results)  Recent Labs  08/28/14 1608  BNP 1300.0*    ProBNP (last 3 results) No results for input(s): PROBNP in the last 8760 hours.  CBG: No results for input(s): GLUCAP in the last 168 hours.  Radiological Exams on Admission: Dg Chest 2 View  08/28/2014   CLINICAL DATA:  Cough, multiple episodes of pneumonia, history hypertension, breast cancer  EXAM: CHEST  2 VIEW  COMPARISON:  01/11/2014  FINDINGS: LEFT subclavian AICD leads project at RIGHT atrium, RIGHT ventricle and coronary sinus.  Enlargement of cardiac silhouette with pulmonary vascular congestion.  Atherosclerotic calcification aorta.  Minimal prominence of interstitial markings versus previous exam, question related to differences in technique though cannot completely exclude minimal diffuse edema.  No pleural effusion or pneumothorax.  Post LEFT mastectomy and axillary node dissection.  Bones demineralized.  IMPRESSION: Enlargement of cardiac silhouette with pulmonary vascular congestion post AICD.  Mild prominence of interstitial markings versus previous exam could be related to differences in technique but unable to exclude minimal pulmonary edema.   Electronically Signed   By: Lavonia Dana M.D.   On: 08/28/2014 15:56    EKG: Independently reviewed. There appears to be a paced rhythm. There is no acute ST-T wave changes.  Assessment/Plan   1. Exacerbation of systolic congestive heart failure. She will be treated with intravenous Lasix. Monitor input/output. Check serial troponin levels. 2. Hypertension. Stable. 3. Anemia of chronic disease. Stable.  Further  recommendations will depend on patient's hospital progress.  Code Status: Full code.   DVT Prophylaxis: Heparin.  Family Communication: I discussed the plan with the patient at the bedside.   Disposition Plan: Home when medically stable.   Time spent: 45 minutes.  Doree Albee [facility] Pager [phone].

## 2014-08-28 NOTE — ED Provider Notes (Signed)
CSN: [id number]     Arrival date & time 08/28/14  1501 History   First MD Initiated Contact with Patient 08/28/14 1510     Chief Complaint  Patient presents with  . Cough     (Consider location/radiation/quality/duration/timing/severity/associated sxs/prior Treatment) HPI Comments: Patient presents from home with 3 day history of cough productive of clear mucus. History limited due to her cognitive impairment and hearing impairment. She denies fever. She endorses some shortness of breath on exertion. She some pain in her chest with coughing. She denies any fever. She denies any abdominal pain, nausea or vomiting. Denies any leg pain or swelling.  She has had some rhinorrhea and sore throat. Denies sick contacts. She did receive a flu shot this season.  The history is provided by the patient and a relative. The history is limited by the condition of the patient.    Past Medical History  Diagnosis Date  . Arteriosclerotic cardiovascular disease (ASCVD)     Remote PTCA by patient report; LBBB; associated cardiomyopathy, presumed ischemic with EF 40-45% previously, 20% in 06/2009; h/o clinical congestive heart failure; negative stress nuclear in 2009 with inferoseptal and apical scar  . Anemia     Hgb of 9-10  . Hyperlipidemia   . Breast cancer   . Hypertension   . Automatic implantable cardioverter-defibrillator in situ   . HOH (hard of hearing)   . Anemia of chronic disease     Hgb of 9-10 chronically; 06/2010: H&H-10.7/33.5, MCV-81, normal iron studies in 2010    Past Surgical History  Procedure Laterality Date  . Total knee arthroplasty Right     Dr.Harrison  . Bi-ventricular implantable cardioverter defibrillator  (crt-d)  07/28/2012  . Cataract extraction w/ intraocular lens implant Left   . Tubal ligation    . Breast biopsy Bilateral   . Mastectomy Left 1998  . Cataract extraction w/phaco Right 09/15/2013    Procedure: CATARACT EXTRACTION PHACO AND INTRAOCULAR LENS PLACEMENT  (IOC);  Surgeon: Elta Guadeloupe T. Gershon Crane, MD;  Location: [facility];  Service: Ophthalmology;  Laterality: Right;  CDE:  10.74  . Bi-ventricular implantable cardioverter defibrillator N/A 07/28/2012    Procedure: BI-VENTRICULAR IMPLANTABLE CARDIOVERTER DEFIBRILLATOR  (CRT-D);  Surgeon: Evans Lance, MD;  Location: [facility] CATH LAB;  Service: Cardiovascular;  Laterality: N/A;   Family History  Problem Relation Age of Onset  . Diabetes Father   . Cancer Father   . Hypertension Brother    History  Substance Use Topics  . Smoking status: Former Smoker    Types: Cigarettes  . Smokeless tobacco: Current User    Types: Chew     Comment: 07/03/2013 "smoked some; don't know how much or how long or when I quit"  . Alcohol Use: No   OB History    Gravida Para Term Preterm AB TAB SAB Ectopic Multiple Living   1 1 1        0     Review of Systems  Constitutional: Negative for fever, activity change and appetite change.  HENT: Positive for congestion and rhinorrhea.   Eyes: Negative for visual disturbance.  Respiratory: Positive for cough, chest tightness and shortness of breath.   Cardiovascular: Negative for chest pain.  Gastrointestinal: Negative for nausea, vomiting and abdominal pain.  Genitourinary: Negative for dysuria, hematuria, vaginal bleeding and vaginal discharge.  Musculoskeletal: Negative for myalgias, back pain and arthralgias.  Skin: Negative for rash.  Neurological: Negative for dizziness, weakness and headaches.  A complete 10 system review of systems was  obtained and all systems are negative except as noted in the HPI and PMH.      Allergies  Review of patient's allergies indicates no known allergies.  Home Medications   Prior to Admission medications   Medication Sig Start Date End Date Taking? Authorizing Provider  acetaminophen (TYLENOL) 500 MG tablet Take 1,000 mg by mouth every 6 (six) hours as needed.    Yes Historical Provider, MD  aspirin 325 MG EC tablet Take 325 mg by  mouth daily.   Yes Historical Provider, MD  carvedilol (COREG) 6.25 MG tablet TAKE 1 TABLET (6.25 MG TOTAL) BY MOUTH 2 (TWO) TIMES DAILY WITH A MEAL. 01/20/14  Yes Herminio Commons, MD  furosemide (LASIX) 40 MG tablet TAKE 1 TABLET BY MOUTH TWICE A DAY 03/22/14  Yes Herminio Commons, MD  hydroxychloroquine (PLAQUENIL) 200 MG tablet Take 100-200 mg by mouth 2 (two) times daily. Takes a whole tablet in the morning and half a tablet in the evening . 10/25/13  Yes Historical Provider, MD  lisinopril (PRINIVIL,ZESTRIL) 40 MG tablet TAKE 1 TABLET BY MOUTH EVERY DAY 07/22/14  Yes Herminio Commons, MD  lovastatin (MEVACOR) 40 MG tablet Take 1 tablet (40 mg total) by mouth at bedtime. 05/12/14  Yes Evans Lance, MD  omeprazole (PRILOSEC) 20 MG capsule Take 20 mg by mouth daily. For gas/heartburn   Yes Historical Provider, MD  PROAIR HFA 108 (90 BASE) MCG/ACT inhaler Inhale 2 puffs into the lungs every 4 (four) hours as needed for wheezing or shortness of breath.  07/23/14  Yes Historical Provider, MD  ibuprofen (ADVIL,MOTRIN) 200 MG tablet Take 1 tablet (200 mg total) by mouth every 6 (six) hours as needed for fever, headache, mild pain or moderate pain. Patient not taking: Reported on 08/28/2014 07/06/13   Modena Jansky, MD  nitroGLYCERIN (NITROSTAT) 0.4 MG SL tablet Place 1 tablet (0.4 mg total) under the tongue every 5 (five) minutes as needed for chest pain. Patient not taking: Reported on 06/03/2014 11/05/13   Herminio Commons, MD  potassium chloride (K-DUR) 10 MEQ tablet Take 1 tablet (10 mEq total) by mouth 2 (two) times daily. Patient not taking: Reported on 06/03/2014 07/06/13   Modena Jansky, MD  predniSONE (DELTASONE) 5 MG tablet 5 mg daily.  09/29/13   Historical Provider, MD   BP 142/68 mmHg  Pulse 81  Temp(Src) 99.6 F (37.6 C) (Oral)  Resp 18  Ht 5\' 4"  (1.626 m)  Wt 133 lb 8 oz (60.555 kg)  BMI 22.90 kg/m2  SpO2 100% Physical Exam  Constitutional: She appears well-developed and  well-nourished. No distress.  HENT:  Head: Normocephalic and atraumatic.  Mouth/Throat: Oropharynx is clear and moist. No oropharyngeal exudate.  Eyes: Conjunctivae and EOM are normal. Pupils are equal, round, and reactive to light.  Neck: Normal range of motion. Neck supple.  No meningismus.  Cardiovascular: Normal rate, regular rhythm, normal heart sounds and intact distal pulses.   No murmur heard. Pulmonary/Chest: Effort normal. No respiratory distress. She has rales.  Bilateral crackles  Abdominal: Soft. There is no tenderness. There is no rebound and no guarding.  Musculoskeletal: Normal range of motion. She exhibits edema. She exhibits no tenderness.  Trace pedal edema  Neurological: She is alert. No cranial nerve deficit. She exhibits normal muscle tone. Coordination normal.  No ataxia on finger to nose bilaterally. No pronator drift. 5/5 strength throughout. CN 2-12 intact. Negative Romberg. Equal grip strength. Sensation intact. Gait is normal.  Skin: Skin is warm.  Psychiatric: She has a normal mood and affect. Her behavior is normal.  Nursing note and vitals reviewed.   ED Course  Procedures (including critical care time) Labs Review Labs Reviewed  CBC WITH DIFFERENTIAL/PLATELET - Abnormal; Notable for the following:    Hemoglobin 9.4 (*)    HCT 30.3 (*)    MCH 24.2 (*)    RDW 18.2 (*)    All other components within normal limits  COMPREHENSIVE METABOLIC PANEL - Abnormal; Notable for the following:    Glucose, Bld 114 (*)    Total Protein 8.2 (*)    Albumin 2.9 (*)    ALT 13 (*)    All other components within normal limits  BRAIN NATRIURETIC PEPTIDE - Abnormal; Notable for the following:    B Natriuretic Peptide 1300.0 (*)    All other components within normal limits  CBC - Abnormal; Notable for the following:    Hemoglobin 9.6 (*)    HCT 30.9 (*)    MCH 24.2 (*)    RDW 18.1 (*)    All other components within normal limits  TROPONIN I  CREATININE, SERUM   TROPONIN I  TROPONIN I  TROPONIN I  COMPREHENSIVE METABOLIC PANEL  CBC    Imaging Review Dg Chest 2 View  08/28/2014   CLINICAL DATA:  Cough, multiple episodes of pneumonia, history hypertension, breast cancer  EXAM: CHEST  2 VIEW  COMPARISON:  01/11/2014  FINDINGS: LEFT subclavian AICD leads project at RIGHT atrium, RIGHT ventricle and coronary sinus.  Enlargement of cardiac silhouette with pulmonary vascular congestion.  Atherosclerotic calcification aorta.  Minimal prominence of interstitial markings versus previous exam, question related to differences in technique though cannot completely exclude minimal diffuse edema.  No pleural effusion or pneumothorax.  Post LEFT mastectomy and axillary node dissection.  Bones demineralized.  IMPRESSION: Enlargement of cardiac silhouette with pulmonary vascular congestion post AICD.  Mild prominence of interstitial markings versus previous exam could be related to differences in technique but unable to exclude minimal pulmonary edema.   Electronically Signed   By: Lavonia Dana M.D.   On: 08/28/2014 15:56     EKG Interpretation   Date/Time:  Saturday Aug 28 2014 15:31:58 EDT Ventricular Rate:  98 PR Interval:  152 QRS Duration: 128 QT Interval:  414 QTC Calculation: 529 R Axis:   -82 Text Interpretation:  Sinus rhythm Atrial premature complex Probable left  atrial enlargement Atrial-sensed ventricular-paced rhythm Left ventricular  hypertrophy Inferior infarct, old Anterior infarct, old Baseline wander in  lead(s) I III aVL Confirmed by Glasscock [phone]) on 08/28/2014  3:38:40 PM      MDM   Final diagnoses:  CHF exacerbation   3 days of cough with shortness of breath and chest tightness with coughing. No fever. No vomiting. History of CAD ischemic cardiomyopathy with an AICD in place.  Chest x-ray shows mild interstitial edema. Patient desaturates to 89% with ambulation.  BNP 1300. Creatinine at baseline.  Patient with new  oxygen requirement. IV Lasix given for CHF exacerbation Admission d/w Dr. Anastasio Champion.   Ezequiel Essex, MD 08/28/14 2116

## 2014-08-29 LAB — COMPREHENSIVE METABOLIC PANEL
ALBUMIN: 2.7 g/dL — AB (ref 3.5–5.0)
ALK PHOS: 89 U/L (ref 38–126)
ALT: 10 U/L — AB (ref 14–54)
AST: 16 U/L (ref 15–41)
Anion gap: 12 (ref 5–15)
BUN: 15 mg/dL (ref 6–20)
CO2: 28 mmol/L (ref 22–32)
Calcium: 8.5 mg/dL — ABNORMAL LOW (ref 8.9–10.3)
Chloride: 99 mmol/L — ABNORMAL LOW (ref 101–111)
Creatinine, Ser: 0.62 mg/dL (ref 0.44–1.00)
GFR calc Af Amer: >60 mL/min (ref >60)
GFR calc non Af Amer: >60 mL/min (ref >60)
Glucose, Bld: 100 mg/dL — ABNORMAL HIGH (ref 70–99)
POTASSIUM: 3.4 mmol/L — AB (ref 3.5–5.1)
SODIUM: 139 mmol/L (ref 135–145)
TOTAL PROTEIN: 8 g/dL (ref 6.5–8.1)
Total Bilirubin: 0.5 mg/dL (ref 0.3–1.2)

## 2014-08-29 LAB — CBC
HCT: 29.3 % — ABNORMAL LOW (ref 36.0–46.0)
Hemoglobin: 9.3 g/dL — ABNORMAL LOW (ref 12.0–15.0)
MCH: 24.5 pg — AB (ref 26.0–34.0)
MCHC: 31.7 g/dL (ref 30.0–36.0)
MCV: 77.3 fL — ABNORMAL LOW (ref 78.0–100.0)
Platelets: 311 10*3/uL (ref 150–400)
RBC: 3.79 MIL/uL — ABNORMAL LOW (ref 3.87–5.11)
RDW: 18.1 % — ABNORMAL HIGH (ref 11.5–15.5)
WBC: 8.6 10*3/uL (ref 4.0–10.5)

## 2014-08-29 LAB — TROPONIN I
TROPONIN I: 0.03 ng/mL (ref <0.031)
TROPONIN I: 0.03 ng/mL (ref <0.031)

## 2014-08-29 NOTE — Progress Notes (Signed)
Subjective: Patient was admitted yesterday due cough and shortness of breath. She was found to have decompensated CHF. Patient is on IV lasix.  Objective: Vital signs in last 24 hours: Temp:  [98.3 F (36.8 C)-100.4 F (38 C)] 99.1 F (37.3 C) (05/08 1004) Pulse Rate:  [72-103] 103 (05/08 0657) Resp:  [18-22] 20 (05/08 0657) BP: (138-166)/(62-83) 149/62 mmHg (05/08 0657) SpO2:  [92 %-100 %] 93 % (05/08 0657) Weight:  [60.555 kg (133 lb 8 oz)-65.772 kg (145 lb)] 60.6 kg (133 lb 9.6 oz) (05/08 0657) Weight change:  Last BM Date: 08/28/14  Intake/Output from previous day:    PHYSICAL EXAM General appearance: alert and no distress Resp: clear to auscultation bilaterally Cardio: S1, S2 normal GI: soft, non-tender; bowel sounds normal; no masses,  no organomegaly Extremities: extremities normal, atraumatic, no cyanosis or edema  Lab Results:  Results for orders placed or performed during the hospital encounter of 08/28/14 (from the past 48 hour(s))  CBC with Differential/Platelet     Status: Abnormal   Collection Time: 08/28/14  4:08 PM  Result Value Ref Range   WBC 8.0 4.0 - 10.5 K/uL   RBC 3.88 3.87 - 5.11 MIL/uL   Hemoglobin 9.4 (L) 12.0 - 15.0 g/dL   HCT 30.3 (L) 36.0 - 46.0 %   MCV 78.1 78.0 - 100.0 fL   MCH 24.2 (L) 26.0 - 34.0 pg   MCHC 31.0 30.0 - 36.0 g/dL   RDW 18.2 (H) 11.5 - 15.5 %   Platelets 295 150 - 400 K/uL   Neutrophils Relative % 66 43 - 77 %   Neutro Abs 5.4 1.7 - 7.7 K/uL   Lymphocytes Relative 21 12 - 46 %   Lymphs Abs 1.6 0.7 - 4.0 K/uL   Monocytes Relative 10 3 - 12 %   Monocytes Absolute 0.8 0.1 - 1.0 K/uL   Eosinophils Relative 2 0 - 5 %   Eosinophils Absolute 0.1 0.0 - 0.7 K/uL   Basophils Relative 1 0 - 1 %   Basophils Absolute 0.0 0.0 - 0.1 K/uL  Comprehensive metabolic panel     Status: Abnormal   Collection Time: 08/28/14  4:08 PM  Result Value Ref Range   Sodium 141 135 - 145 mmol/L   Potassium 3.8 3.5 - 5.1 mmol/L   Chloride 103 101  - 111 mmol/L   CO2 29 22 - 32 mmol/L   Glucose, Bld 114 (H) 70 - 99 mg/dL   BUN 15 6 - 20 mg/dL   Creatinine, Ser 0.73 0.44 - 1.00 mg/dL   Calcium 8.9 8.9 - 10.3 mg/dL   Total Protein 8.2 (H) 6.5 - 8.1 g/dL   Albumin 2.9 (L) 3.5 - 5.0 g/dL   AST 17 15 - 41 U/L   ALT 13 (L) 14 - 54 U/L   Alkaline Phosphatase 97 38 - 126 U/L   Total Bilirubin 0.5 0.3 - 1.2 mg/dL   GFR calc non Af Amer >60 >60 mL/min   GFR calc Af Amer >60 >60 mL/min    Comment: (NOTE) The eGFR has been calculated using the CKD EPI equation. This calculation has not been validated in all clinical situations. eGFR's persistently <60 mL/min signify possible Chronic Kidney Disease.    Anion gap 9 5 - 15  Brain natriuretic peptide     Status: Abnormal   Collection Time: 08/28/14  4:08 PM  Result Value Ref Range   B Natriuretic Peptide 1300.0 (H) 0.0 - 100.0 pg/mL  Troponin I  Status: None   Collection Time: 08/28/14  4:08 PM  Result Value Ref Range   Troponin I 0.03 <0.031 ng/mL    Comment:        NO INDICATION OF MYOCARDIAL INJURY.   CBC     Status: Abnormal   Collection Time: 08/28/14  7:13 PM  Result Value Ref Range   WBC 8.0 4.0 - 10.5 K/uL   RBC 3.96 3.87 - 5.11 MIL/uL   Hemoglobin 9.6 (L) 12.0 - 15.0 g/dL   HCT 30.9 (L) 36.0 - 46.0 %   MCV 78.0 78.0 - 100.0 fL   MCH 24.2 (L) 26.0 - 34.0 pg   MCHC 31.1 30.0 - 36.0 g/dL   RDW 18.1 (H) 11.5 - 15.5 %   Platelets 343 150 - 400 K/uL  Creatinine, serum     Status: None   Collection Time: 08/28/14  7:13 PM  Result Value Ref Range   Creatinine, Ser 0.72 0.44 - 1.00 mg/dL   GFR calc non Af Amer >60 >60 mL/min   GFR calc Af Amer >60 >60 mL/min    Comment: (NOTE) The eGFR has been calculated using the CKD EPI equation. This calculation has not been validated in all clinical situations. eGFR's persistently <60 mL/min signify possible Chronic Kidney Disease.   Troponin I     Status: None   Collection Time: 08/28/14  7:13 PM  Result Value Ref Range    Troponin I 0.03 <0.031 ng/mL    Comment:        NO INDICATION OF MYOCARDIAL INJURY.   Troponin I     Status: None   Collection Time: 08/29/14 12:41 AM  Result Value Ref Range   Troponin I 0.03 <0.031 ng/mL    Comment:        NO INDICATION OF MYOCARDIAL INJURY.   Troponin I     Status: None   Collection Time: 08/29/14  7:25 AM  Result Value Ref Range   Troponin I 0.03 <0.031 ng/mL    Comment:        NO INDICATION OF MYOCARDIAL INJURY.   Comprehensive metabolic panel     Status: Abnormal   Collection Time: 08/29/14  7:25 AM  Result Value Ref Range   Sodium 139 135 - 145 mmol/L   Potassium 3.4 (L) 3.5 - 5.1 mmol/L   Chloride 99 (L) 101 - 111 mmol/L   CO2 28 22 - 32 mmol/L   Glucose, Bld 100 (H) 70 - 99 mg/dL   BUN 15 6 - 20 mg/dL   Creatinine, Ser 0.62 0.44 - 1.00 mg/dL   Calcium 8.5 (L) 8.9 - 10.3 mg/dL   Total Protein 8.0 6.5 - 8.1 g/dL   Albumin 2.7 (L) 3.5 - 5.0 g/dL   AST 16 15 - 41 U/L   ALT 10 (L) 14 - 54 U/L   Alkaline Phosphatase 89 38 - 126 U/L   Total Bilirubin 0.5 0.3 - 1.2 mg/dL   GFR calc non Af Amer >60 >60 mL/min   GFR calc Af Amer >60 >60 mL/min    Comment: (NOTE) The eGFR has been calculated using the CKD EPI equation. This calculation has not been validated in all clinical situations. eGFR's persistently <60 mL/min signify possible Chronic Kidney Disease.    Anion gap 12 5 - 15  CBC     Status: Abnormal   Collection Time: 08/29/14  7:25 AM  Result Value Ref Range   WBC 8.6 4.0 - 10.5 K/uL   RBC 3.79 (  L) 3.87 - 5.11 MIL/uL   Hemoglobin 9.3 (L) 12.0 - 15.0 g/dL   HCT 29.3 (L) 36.0 - 46.0 %   MCV 77.3 (L) 78.0 - 100.0 fL   MCH 24.5 (L) 26.0 - 34.0 pg   MCHC 31.7 30.0 - 36.0 g/dL   RDW 18.1 (H) 11.5 - 15.5 %   Platelets 311 150 - 400 K/uL    ABGS No results for input(s): PHART, PO2ART, TCO2, HCO3 in the last 72 hours.  Invalid input(s): PCO2 CULTURES No results found for this or any previous visit (from the past 240  hour(s)). Studies/Results: Dg Chest 2 View  08/28/2014   CLINICAL DATA:  Cough, multiple episodes of pneumonia, history hypertension, breast cancer  EXAM: CHEST  2 VIEW  COMPARISON:  01/11/2014  FINDINGS: LEFT subclavian AICD leads project at RIGHT atrium, RIGHT ventricle and coronary sinus.  Enlargement of cardiac silhouette with pulmonary vascular congestion.  Atherosclerotic calcification aorta.  Minimal prominence of interstitial markings versus previous exam, question related to differences in technique though cannot completely exclude minimal diffuse edema.  No pleural effusion or pneumothorax.  Post LEFT mastectomy and axillary node dissection.  Bones demineralized.  IMPRESSION: Enlargement of cardiac silhouette with pulmonary vascular congestion post AICD.  Mild prominence of interstitial markings versus previous exam could be related to differences in technique but unable to exclude minimal pulmonary edema.   Electronically Signed   By: Lavonia Dana M.D.   On: 08/28/2014 15:56    Medications: I have reviewed the patient's current medications.  Assesment:   Active Problems:   Anemia of chronic disease   Hypertension   Biventricular ICD (implantable cardioverter-defibrillator) in place   CHF exacerbation   Systolic congestive heart failure    Plan:  Medications reviewed Refill done Will continue IV lasix Will monitor BMP/CBC Cardiology consult.    LOS: 1 day   Jaquese Irving 08/29/2014, 12:13 PM

## 2014-08-30 DIAGNOSIS — I5023 Acute on chronic systolic (congestive) heart failure: Secondary | ICD-10-CM

## 2014-08-30 DIAGNOSIS — R079 Chest pain, unspecified: Secondary | ICD-10-CM

## 2014-08-30 DIAGNOSIS — I25118 Atherosclerotic heart disease of native coronary artery with other forms of angina pectoris: Secondary | ICD-10-CM

## 2014-08-30 MED ORDER — FUROSEMIDE 10 MG/ML IJ SOLN
40.0000 mg | Freq: Three times a day (TID) | INTRAMUSCULAR | Status: DC
  Administered 2014-08-30 – 2014-08-31 (×5): 40 mg via INTRAVENOUS
  Filled 2014-08-30 (×6): qty 4

## 2014-08-30 MED ORDER — POTASSIUM CHLORIDE CRYS ER 20 MEQ PO TBCR
40.0000 meq | EXTENDED_RELEASE_TABLET | Freq: Two times a day (BID) | ORAL | Status: DC
  Administered 2014-08-30 (×2): 40 meq via ORAL
  Filled 2014-08-30 (×3): qty 2

## 2014-08-30 MED ORDER — ASPIRIN 81 MG PO CHEW
81.0000 mg | CHEWABLE_TABLET | Freq: Every day | ORAL | Status: DC
  Administered 2014-08-31 – 2014-09-06 (×7): 81 mg via ORAL
  Filled 2014-08-30 (×7): qty 1

## 2014-08-30 NOTE — Care Management Note (Signed)
Case Management Note  Patient Details  Name: ARTESHA FLORIA MRN: [id number] Date of Birth: July 27, 1940  Subjective/Objective:                  Pt admitted from home with CHF. Pt lives with her husband and takes care of her brother. Pt is very independent with ADL's.  Action/Plan: No CM needs noted. Anticipate discharge within 48 hours.  Expected Discharge Date:  08/30/14               Expected Discharge Plan:  Home/Self Care  In-House Referral:  NA  Discharge planning Services  CM Consult  Post Acute Care Choice:  NA Choice offered to:  NA  DME Arranged:    DME Agency:     HH Arranged:    HH Agency:     Status of Service:  Completed, signed off  Medicare Important Message Given:    Date Medicare IM Given:    Medicare IM give by:    Date Additional Medicare IM Given:    Additional Medicare Important Message give by:     If discussed at [place] of Stay Meetings, dates discussed:    Additional Comments:  Joylene Draft, RN 08/30/2014, 1:19 PM

## 2014-08-30 NOTE — Progress Notes (Signed)
Notified MD that patients BP: 103/45 and HR: 68. MD ordered to hold lisinopril and coreg at this time. Patient is alert and oriented and has no complaints. Will continue to monitor patient at this time.

## 2014-08-30 NOTE — Progress Notes (Signed)
UR chart review completed.  

## 2014-08-30 NOTE — Progress Notes (Signed)
Subjective: Patient feels better. Her breathing is improving. She is on IV lasix...  Objective: Vital signs in last 24 hours: Temp:  [98.6 F (37 C)-99.1 F (37.3 C)] 99 F (37.2 C) (05/09 0627) Pulse Rate:  [60-94] 60 (05/09 0627) Resp:  [18-20] 18 (05/09 0627) BP: (101-140)/(44-57) 101/52 mmHg (05/09 0627) SpO2:  [90 %-95 %] 93 % (05/09 0627) Weight change:  Last BM Date: 08/28/14  Intake/Output from previous day: 05/08 0701 - 05/09 0700 In: 1150 [P.O.:1150] Out: 452 [Urine:452]  PHYSICAL EXAM General appearance: alert and no distress Resp: clear to auscultation bilaterally Cardio: S1, S2 normal GI: soft, non-tender; bowel sounds normal; no masses,  no organomegaly Extremities: extremities normal, atraumatic, no cyanosis or edema  Lab Results:  Results for orders placed or performed during the hospital encounter of 08/28/14 (from the past 48 hour(s))  CBC with Differential/Platelet     Status: Abnormal   Collection Time: 08/28/14  4:08 PM  Result Value Ref Range   WBC 8.0 4.0 - 10.5 K/uL   RBC 3.88 3.87 - 5.11 MIL/uL   Hemoglobin 9.4 (L) 12.0 - 15.0 g/dL   HCT 30.3 (L) 36.0 - 46.0 %   MCV 78.1 78.0 - 100.0 fL   MCH 24.2 (L) 26.0 - 34.0 pg   MCHC 31.0 30.0 - 36.0 g/dL   RDW 18.2 (H) 11.5 - 15.5 %   Platelets 295 150 - 400 K/uL   Neutrophils Relative % 66 43 - 77 %   Neutro Abs 5.4 1.7 - 7.7 K/uL   Lymphocytes Relative 21 12 - 46 %   Lymphs Abs 1.6 0.7 - 4.0 K/uL   Monocytes Relative 10 3 - 12 %   Monocytes Absolute 0.8 0.1 - 1.0 K/uL   Eosinophils Relative 2 0 - 5 %   Eosinophils Absolute 0.1 0.0 - 0.7 K/uL   Basophils Relative 1 0 - 1 %   Basophils Absolute 0.0 0.0 - 0.1 K/uL  Comprehensive metabolic panel     Status: Abnormal   Collection Time: 08/28/14  4:08 PM  Result Value Ref Range   Sodium 141 135 - 145 mmol/L   Potassium 3.8 3.5 - 5.1 mmol/L   Chloride 103 101 - 111 mmol/L   CO2 29 22 - 32 mmol/L   Glucose, Bld 114 (H) 70 - 99 mg/dL   BUN 15 6 -  20 mg/dL   Creatinine, Ser 0.73 0.44 - 1.00 mg/dL   Calcium 8.9 8.9 - 10.3 mg/dL   Total Protein 8.2 (H) 6.5 - 8.1 g/dL   Albumin 2.9 (L) 3.5 - 5.0 g/dL   AST 17 15 - 41 U/L   ALT 13 (L) 14 - 54 U/L   Alkaline Phosphatase 97 38 - 126 U/L   Total Bilirubin 0.5 0.3 - 1.2 mg/dL   GFR calc non Af Amer >60 >60 mL/min   GFR calc Af Amer >60 >60 mL/min    Comment: (NOTE) The eGFR has been calculated using the CKD EPI equation. This calculation has not been validated in all clinical situations. eGFR's persistently <60 mL/min signify possible Chronic Kidney Disease.    Anion gap 9 5 - 15  Brain natriuretic peptide     Status: Abnormal   Collection Time: 08/28/14  4:08 PM  Result Value Ref Range   B Natriuretic Peptide 1300.0 (H) 0.0 - 100.0 pg/mL  Troponin I     Status: None   Collection Time: 08/28/14  4:08 PM  Result Value Ref Range  Troponin I 0.03 <0.031 ng/mL    Comment:        NO INDICATION OF MYOCARDIAL INJURY.   CBC     Status: Abnormal   Collection Time: 08/28/14  7:13 PM  Result Value Ref Range   WBC 8.0 4.0 - 10.5 K/uL   RBC 3.96 3.87 - 5.11 MIL/uL   Hemoglobin 9.6 (L) 12.0 - 15.0 g/dL   HCT 30.9 (L) 36.0 - 46.0 %   MCV 78.0 78.0 - 100.0 fL   MCH 24.2 (L) 26.0 - 34.0 pg   MCHC 31.1 30.0 - 36.0 g/dL   RDW 18.1 (H) 11.5 - 15.5 %   Platelets 343 150 - 400 K/uL  Creatinine, serum     Status: None   Collection Time: 08/28/14  7:13 PM  Result Value Ref Range   Creatinine, Ser 0.72 0.44 - 1.00 mg/dL   GFR calc non Af Amer >60 >60 mL/min   GFR calc Af Amer >60 >60 mL/min    Comment: (NOTE) The eGFR has been calculated using the CKD EPI equation. This calculation has not been validated in all clinical situations. eGFR's persistently <60 mL/min signify possible Chronic Kidney Disease.   Troponin I     Status: None   Collection Time: 08/28/14  7:13 PM  Result Value Ref Range   Troponin I 0.03 <0.031 ng/mL    Comment:        NO INDICATION OF MYOCARDIAL INJURY.    Troponin I     Status: None   Collection Time: 08/29/14 12:41 AM  Result Value Ref Range   Troponin I 0.03 <0.031 ng/mL    Comment:        NO INDICATION OF MYOCARDIAL INJURY.   Troponin I     Status: None   Collection Time: 08/29/14  7:25 AM  Result Value Ref Range   Troponin I 0.03 <0.031 ng/mL    Comment:        NO INDICATION OF MYOCARDIAL INJURY.   Comprehensive metabolic panel     Status: Abnormal   Collection Time: 08/29/14  7:25 AM  Result Value Ref Range   Sodium 139 135 - 145 mmol/L   Potassium 3.4 (L) 3.5 - 5.1 mmol/L   Chloride 99 (L) 101 - 111 mmol/L   CO2 28 22 - 32 mmol/L   Glucose, Bld 100 (H) 70 - 99 mg/dL   BUN 15 6 - 20 mg/dL   Creatinine, Ser 0.62 0.44 - 1.00 mg/dL   Calcium 8.5 (L) 8.9 - 10.3 mg/dL   Total Protein 8.0 6.5 - 8.1 g/dL   Albumin 2.7 (L) 3.5 - 5.0 g/dL   AST 16 15 - 41 U/L   ALT 10 (L) 14 - 54 U/L   Alkaline Phosphatase 89 38 - 126 U/L   Total Bilirubin 0.5 0.3 - 1.2 mg/dL   GFR calc non Af Amer >60 >60 mL/min   GFR calc Af Amer >60 >60 mL/min    Comment: (NOTE) The eGFR has been calculated using the CKD EPI equation. This calculation has not been validated in all clinical situations. eGFR's persistently <60 mL/min signify possible Chronic Kidney Disease.    Anion gap 12 5 - 15  CBC     Status: Abnormal   Collection Time: 08/29/14  7:25 AM  Result Value Ref Range   WBC 8.6 4.0 - 10.5 K/uL   RBC 3.79 (L) 3.87 - 5.11 MIL/uL   Hemoglobin 9.3 (L) 12.0 - 15.0 g/dL   HCT  29.3 (L) 36.0 - 46.0 %   MCV 77.3 (L) 78.0 - 100.0 fL   MCH 24.5 (L) 26.0 - 34.0 pg   MCHC 31.7 30.0 - 36.0 g/dL   RDW 18.1 (H) 11.5 - 15.5 %   Platelets 311 150 - 400 K/uL    ABGS No results for input(s): PHART, PO2ART, TCO2, HCO3 in the last 72 hours.  Invalid input(s): PCO2 CULTURES No results found for this or any previous visit (from the past 240 hour(s)). Studies/Results: Dg Chest 2 View  08/28/2014   CLINICAL DATA:  Cough, multiple episodes of  pneumonia, history hypertension, breast cancer  EXAM: CHEST  2 VIEW  COMPARISON:  01/11/2014  FINDINGS: LEFT subclavian AICD leads project at RIGHT atrium, RIGHT ventricle and coronary sinus.  Enlargement of cardiac silhouette with pulmonary vascular congestion.  Atherosclerotic calcification aorta.  Minimal prominence of interstitial markings versus previous exam, question related to differences in technique though cannot completely exclude minimal diffuse edema.  No pleural effusion or pneumothorax.  Post LEFT mastectomy and axillary node dissection.  Bones demineralized.  IMPRESSION: Enlargement of cardiac silhouette with pulmonary vascular congestion post AICD.  Mild prominence of interstitial markings versus previous exam could be related to differences in technique but unable to exclude minimal pulmonary edema.   Electronically Signed   By: Lavonia Dana M.D.   On: 08/28/2014 15:56    Medications: I have reviewed the patient's current medications.  Assesment:   Active Problems:   Anemia of chronic disease   Hypertension   Biventricular ICD (implantable cardioverter-defibrillator) in place   CHF exacerbation   Systolic congestive heart failure    Plan:  Medications reviewed Will replace K+ Will continue IV lasix Will monitor BMP Cardiology consult.    LOS: 2 days   Christiana Gurevich 08/30/2014, 7:54 AM

## 2014-08-30 NOTE — Consult Note (Addendum)
CARDIOLOGY CONSULT NOTE   Patient ID: [name] MRN: [id number] DOB/AGE: 12/08/1940 74 y.o.  Admit Date: 08/28/2014 Referring Physician: Legrand Rams, MD Primary Physician: [name], MD Consulting Cardiologist: Kate Sable MD Primary Cardiologist: Cristopher Peru MD Reason for Consultation: CHF  Clinical Summary [name] is a 74 y.o.female with known hx of hypertension, CAD with PCI unknown artery, systolic CHF, LBBB s/p BiV ICD (Medtronic-Last device check 06/08/2014); microcytic anemia, admitted with chest pain and coughing.  States that worsening coughing and PND for a week but a poor historian   On arrival to ER, BP 166/83, HR 103. O2 Sat 93%. Anemic at 9.4 but similar to previous Hgb of 9.3 in April. Creatinine 0.73, Pro-BNP 1300. CXR demonstrated minimal pulmonary edema. EKG Atrial sensing.   She states that she has been eating "what I like" to include sausage and other salty foods. Continues to add salt to foods, "but not too much." Ran out of lasix for 2 days earlier, but got Rx filled. States she does not take lasix BID as directed, usually once a day. She is caring for her brother and BID dose causes her to urinate too much. She states that she has not consistently slept in her bed for months. Usually up in a chair. She was treated with IV lasix  20 mg, ASA and duo-neb inhaler.    No Known Allergies  Medications Scheduled Medications: . aspirin  325 mg Oral Daily  . carvedilol  6.25 mg Oral BID WC  . furosemide  40 mg Intravenous BID  . heparin  5,000 Units Subcutaneous 3 times per day  . hydroxychloroquine  100 mg Oral QPC supper  . hydroxychloroquine  200 mg Oral Daily  . lisinopril  40 mg Oral Daily  . pantoprazole  40 mg Oral Daily  . potassium chloride  40 mEq Oral BID  . pravastatin  40 mg Oral q1800  . sodium chloride  3 mL Intravenous Q12H   PRN Medications: acetaminophen, albuterol, ondansetron **OR** ondansetron (ZOFRAN) IV   Past Medical  History  Diagnosis Date  . Arteriosclerotic cardiovascular disease (ASCVD)     Remote PTCA by patient report; LBBB; associated cardiomyopathy, presumed ischemic with EF 40-45% previously, 20% in 06/2009; h/o clinical congestive heart failure; negative stress nuclear in 2009 with inferoseptal and apical scar  . Anemia     Hgb of 9-10  . Hyperlipidemia   . Breast cancer   . Hypertension   . Automatic implantable cardioverter-defibrillator in situ   . HOH (hard of hearing)   . Anemia of chronic disease     Hgb of 9-10 chronically; 06/2010: H&H-10.7/33.5, MCV-81, normal iron studies in 2010     Past Surgical History  Procedure Laterality Date  . Total knee arthroplasty Right     Dr.Harrison  . Bi-ventricular implantable cardioverter defibrillator  (crt-d)  07/28/2012  . Cataract extraction w/ intraocular lens implant Left   . Tubal ligation    . Breast biopsy Bilateral   . Mastectomy Left 1998  . Cataract extraction w/phaco Right 09/15/2013    Procedure: CATARACT EXTRACTION PHACO AND INTRAOCULAR LENS PLACEMENT (IOC);  Surgeon: Elta Guadeloupe T. Gershon Crane, MD;  Location: [facility];  Service: Ophthalmology;  Laterality: Right;  CDE:  10.74  . Bi-ventricular implantable cardioverter defibrillator N/A 07/28/2012    Procedure: BI-VENTRICULAR IMPLANTABLE CARDIOVERTER DEFIBRILLATOR  (CRT-D);  Surgeon: Evans Lance, MD;  Location: [facility] CATH LAB;  Service: Cardiovascular;  Laterality: N/A;    Family History  Problem Relation Age  of Onset  . Diabetes Father   . Cancer Father   . Hypertension Brother     Social History [name] reports that she has quit smoking. Her smoking use included Cigarettes. Her smokeless tobacco use includes Chew. [name] reports that she does not drink alcohol.  Review of Systems Complete review of systems are found to be negative unless outlined in H&P above.  Physical Examination Blood pressure 103/45, pulse 68, temperature 99 F (37.2 C), temperature source Oral, resp.  rate 18, height 5\' 4"  (1.626 m), weight 133 lb 9.6 oz (60.6 kg), SpO2 93 %.  Intake/Output Summary (Last 24 hours) at 08/30/14 1147 Last data filed at 08/30/14 1000  Gross per 24 hour  Intake   1043 ml  Output    550 ml  Net    493 ml    Telemetry: AV pacing  GEN: Resting comfortably, no acute distress HEENT: Conjunctiva and lids normal, oropharynx clear with moist mucosa. Neck: Supple, positive for HJR,  No carotid bruits, no thyromegaly. Lungs: Diminished breath sounds in the bases, some crackles in the upper airway. Coughing.   murmur, no pericardial rub. Abdomen: Slightly distended, nontender, no hepatomegaly, bowel sounds present, no guarding or rebound. Extremities: 1+pitting edema to the knees, distal pulses 2+. Skin: Warm and dry. Musculoskeletal: No kyphosis. Neuropsychiatric: Alert and oriented x3, affect grossly appropriate.Very HOH.   Prior Cardiac Testing/Procedures  1. Echo 07/04/2013 Left ventricle: The cavity size was normal. Wall thickness was normal. Severe global hypokinesis with inferiorand latereal akinesis. Systolic function was moderately to severely reduced. The estimated ejection fraction was in the range of 30% to 35%. Doppler parameters are consistent with pseudonormal left ventricular relaxation (grade2 diastolic dysfunction). The E/A ratio is >1.5. The E/e' ratio is >15, suggesting markedly elevated LV filling pressure. - Aorta: The aorta was mildly calcified. - Mitral valve: Mildly thickened leaflets . Some tethering of the posterior leaflet. Mild, posteriorly directed regurgitation. - Left atrium: Moderately dilated (37 ml/m2). - Right ventricle: The cavity size was normal. AICD wire noted in right ventricle. - Right atrium: The atrium was normal in size. AICDwire noted in right atrium. - Atrial septum: A patent foramen ovale cannot be excluded by color doppler. - Inferior vena cava: The vessel was normal in size;  the respirophasic diameter changes were in the normal range (= 50%); findings are consistent with normal central venous pressure. - Pericardium, extracardiac: There was no pericardial effusion. Lab Results  Basic Metabolic Panel:  Recent Labs Lab 08/28/14 1608 08/28/14 1913 08/29/14 0725  NA 141  --  139  K 3.8  --  3.4*  CL 103  --  99*  CO2 29  --  28  GLUCOSE 114*  --  100*  BUN 15  --  15  CREATININE 0.73 0.72 0.62  CALCIUM 8.9  --  8.5*    Liver Function Tests:  Recent Labs Lab 08/28/14 1608 08/29/14 0725  AST 17 16  ALT 13* 10*  ALKPHOS 97 89  BILITOT 0.5 0.5  PROT 8.2* 8.0  ALBUMIN 2.9* 2.7*    CBC:  Recent Labs Lab 08/28/14 1608 08/28/14 1913 08/29/14 0725  WBC 8.0 8.0 8.6  NEUTROABS 5.4  --   --   HGB 9.4* 9.6* 9.3*  HCT 30.3* 30.9* 29.3*  MCV 78.1 78.0 77.3*  PLT 295 343 311    Cardiac Enzymes:  Recent Labs Lab 08/28/14 1608 08/28/14 1913 08/29/14 0041 08/29/14 0725  TROPONINI 0.03 0.03 0.03 0.03  Radiology: Dg Chest 2 View  08/28/2014   CLINICAL DATA:  Cough, multiple episodes of pneumonia, history hypertension, breast cancer  EXAM: CHEST  2 VIEW  COMPARISON:  01/11/2014  FINDINGS: LEFT subclavian AICD leads project at RIGHT atrium, RIGHT ventricle and coronary sinus.  Enlargement of cardiac silhouette with pulmonary vascular congestion.  Atherosclerotic calcification aorta.  Minimal prominence of interstitial markings versus previous exam, question related to differences in technique though cannot completely exclude minimal diffuse edema.  No pleural effusion or pneumothorax.  Post LEFT mastectomy and axillary node dissection.  Bones demineralized.  IMPRESSION: Enlargement of cardiac silhouette with pulmonary vascular congestion post AICD.  Mild prominence of interstitial markings versus previous exam could be related to differences in technique but unable to exclude minimal pulmonary edema.   Electronically Signed   By: Lavonia Dana M.D.   On: 08/28/2014 15:56     ECG: A/V pacing . Atrial sensed, ventricular pacing.    Impression and Recommendations  1. Acute on Chronic Systolic CHF: Multifactorial due to medical and dietary non-compliance. She has been placed on IV lasix 40 mg BID, will change to TID as she still has significant fluid overload and not yet  Had documented  significant diureses. Last echo in March of 2015, will not repeat at this time. As she diureses, will titrate up coreg dose. Consider adding spironolactone. Creatinine 0.62. Potassium 3.4. Replete to keep at 4.0. Reinforce low sodium diet  2.  Hypertension: BP is well controlled. Will continue to diurese. On ACE. Watch closely as she diureses.   3. Hypercholesterolemia: Continue statin.   4. Medical and dietary non-compliance: Consider Social Services consultation as she is caring for her brother and not always taking her medications. Dietary reinforcement.   5. ICD in situ: Medtronic: States she has appt scheduled with Dr. Lovena Le of Thursday of this week. Will have PPM interrogated if she is still in hospital then.      Signed: Phill Myron. Lawrence NP AACC  08/30/2014, 11:47 AM Co-Sign MD  The patient was seen and examined, and I agree with the assessment and plan as documented above, with modifications as noted below. Pt known to me from clinic, last evaluated in 10/2013 and was supposed to have followed up 4 months later but did not. Has a history of chronic systolic congestive heart failure, CAD s/p remote MI, ICM with EF 30-35% (echocardiogram on 07/04/13) s/p BiV ICD. Admitted with acute on chronic systolic heart failure due to medication and dietary noncompliance. ECG shows ventricular paced rhythm. Ruled out for ACS with serial troponins.  Says she feels much better now. Previously had right-sided "chest soreness". Poor historian. Says breathing and leg swelling have improved.  As it has been several years (2009) since last ischemic  evaluation, will opt for this as outpatient. Will diurese with IV Lasix. Reduce ASA to 81 mg. Continue Coreg, ACEI, and statin. Would recommend [facility] involvement for this patient.

## 2014-08-31 DIAGNOSIS — Z9114 Patient's other noncompliance with medication regimen: Secondary | ICD-10-CM

## 2014-08-31 DIAGNOSIS — I2510 Atherosclerotic heart disease of native coronary artery without angina pectoris: Secondary | ICD-10-CM

## 2014-08-31 DIAGNOSIS — E785 Hyperlipidemia, unspecified: Secondary | ICD-10-CM

## 2014-08-31 DIAGNOSIS — Z9111 Patient's noncompliance with dietary regimen: Secondary | ICD-10-CM

## 2014-08-31 LAB — BASIC METABOLIC PANEL
ANION GAP: 9 (ref 5–15)
BUN: 24 mg/dL — ABNORMAL HIGH (ref 6–20)
CHLORIDE: 98 mmol/L — AB (ref 101–111)
CO2: 29 mmol/L (ref 22–32)
Calcium: 8.6 mg/dL — ABNORMAL LOW (ref 8.9–10.3)
Creatinine, Ser: 0.86 mg/dL (ref 0.44–1.00)
GFR calc non Af Amer: >60 mL/min (ref >60)
Glucose, Bld: 93 mg/dL (ref 70–99)
POTASSIUM: 4.2 mmol/L (ref 3.5–5.1)
SODIUM: 136 mmol/L (ref 135–145)

## 2014-08-31 MED ORDER — POTASSIUM CHLORIDE CRYS ER 20 MEQ PO TBCR
20.0000 meq | EXTENDED_RELEASE_TABLET | Freq: Two times a day (BID) | ORAL | Status: DC
  Administered 2014-08-31 – 2014-09-03 (×6): 20 meq via ORAL
  Filled 2014-08-31 (×6): qty 1

## 2014-08-31 MED ORDER — SPIRONOLACTONE 25 MG PO TABS
12.5000 mg | ORAL_TABLET | Freq: Every day | ORAL | Status: DC
  Administered 2014-08-31 – 2014-09-03 (×4): 12.5 mg via ORAL
  Filled 2014-08-31 (×4): qty 1

## 2014-08-31 NOTE — Progress Notes (Signed)
Subjective: Patient is resting. Her breathing is improving but still she is coughing and congested..  Objective: Vital signs in last 24 hours: Temp:  [99.2 F (37.3 C)-100.2 F (37.9 C)] 99.2 F (37.3 C) (05/10 0449) Pulse Rate:  [68-97] 81 (05/10 0449) Resp:  [18-19] 18 (05/10 0449) BP: (103-135)/(45-67) 134/59 mmHg (05/10 0449) SpO2:  [93 %-94 %] 93 % (05/10 0449) Weight:  [60.147 kg (132 lb 9.6 oz)-61.145 kg (134 lb 12.8 oz)] 60.147 kg (132 lb 9.6 oz) (05/10 0443) Weight change:  Last BM Date: 08/30/14  Intake/Output from previous day: 05/09 0701 - 05/10 0700 In: 363 [P.O.:360; I.V.:3] Out: 1450 [Urine:1450]  PHYSICAL EXAM General appearance: alert and no distress Resp: clear to auscultation bilaterally Cardio: S1, S2 normal GI: soft, non-tender; bowel sounds normal; no masses,  no organomegaly Extremities: extremities normal, atraumatic, no cyanosis or edema  Lab Results:  Results for orders placed or performed during the hospital encounter of 08/28/14 (from the past 48 hour(s))  Basic metabolic panel     Status: Abnormal   Collection Time: 08/31/14  6:24 AM  Result Value Ref Range   Sodium 136 135 - 145 mmol/L   Potassium 4.2 3.5 - 5.1 mmol/L    Comment: DELTA CHECK NOTED   Chloride 98 (L) 101 - 111 mmol/L   CO2 29 22 - 32 mmol/L   Glucose, Bld 93 70 - 99 mg/dL   BUN 24 (H) 6 - 20 mg/dL   Creatinine, Ser 0.86 0.44 - 1.00 mg/dL   Calcium 8.6 (L) 8.9 - 10.3 mg/dL   GFR calc non Af Amer >60 >60 mL/min   GFR calc Af Amer >60 >60 mL/min    Comment: (NOTE) The eGFR has been calculated using the CKD EPI equation. This calculation has not been validated in all clinical situations. eGFR's persistently <60 mL/min signify possible Chronic Kidney Disease.    Anion gap 9 5 - 15    ABGS No results for input(s): PHART, PO2ART, TCO2, HCO3 in the last 72 hours.  Invalid input(s): PCO2 CULTURES No results found for this or any previous visit (from the past 240  hour(s)). Studies/Results: No results found.  Medications: I have reviewed the patient's current medications.  Assesment:   Active Problems:   Anemia of chronic disease   Hypertension   Biventricular ICD (implantable cardioverter-defibrillator) in place   CHF exacerbation   Systolic congestive heart failure    Plan:  Medications reviewed Will continue IV lasix Will monitor BMP Cardiology consult appreciated    LOS: 3 days   Orien Mayhall 08/31/2014, 8:27 AM

## 2014-08-31 NOTE — Progress Notes (Signed)
SUBJECTIVE: She says "I'm feeling better than I was". Denies chest pain and leg swelling.     Intake/Output Summary (Last 24 hours) at 08/31/14 1001 Last data filed at 08/31/14 0445  Gross per 24 hour  Intake    120 ml  Output   1350 ml  Net  -1230 ml    Current Facility-Administered Medications  Medication Dose Route Frequency Provider Last Rate Last Dose  . acetaminophen (TYLENOL) tablet 1,000 mg  1,000 mg Oral Q6H PRN Doree Albee, MD   1,000 mg at 08/30/14 2125  . albuterol (PROVENTIL) (2.5 MG/3ML) 0.083% nebulizer solution 3 mL  3 mL Inhalation Q4H PRN Nimish C Gosrani, MD      . aspirin chewable tablet 81 mg  81 mg Oral Daily Herminio Commons, MD      . carvedilol (COREG) tablet 6.25 mg  6.25 mg Oral BID WC Nimish C Gosrani, MD   6.25 mg at 08/30/14 1757  . furosemide (LASIX) injection 40 mg  40 mg Intravenous TID Lendon Colonel, NP   40 mg at 08/30/14 2125  . heparin injection 5,000 Units  5,000 Units Subcutaneous 3 times per day Doree Albee, MD   5,000 Units at 08/31/14 0602  . hydroxychloroquine (PLAQUENIL) tablet 100 mg  100 mg Oral QPC supper Doree Albee, MD   100 mg at 08/30/14 1758  . hydroxychloroquine (PLAQUENIL) tablet 200 mg  200 mg Oral Daily [name], MD   200 mg at 08/30/14 0940  . lisinopril (PRINIVIL,ZESTRIL) tablet 40 mg  40 mg Oral Daily Nimish C Anastasio Champion, MD   40 mg at 08/29/14 1005  . ondansetron (ZOFRAN) tablet 4 mg  4 mg Oral Q6H PRN Nimish C Gosrani, MD       Or  . ondansetron (ZOFRAN) injection 4 mg  4 mg Intravenous Q6H PRN Nimish Luther Parody, MD   4 mg at 08/29/14 1436  . pantoprazole (PROTONIX) EC tablet 40 mg  40 mg Oral Daily Doree Albee, MD   40 mg at 08/30/14 0941  . potassium chloride SA (K-DUR,KLOR-CON) CR tablet 40 mEq  40 mEq Oral BID [name], MD   40 mEq at 08/30/14 2125  . pravastatin (PRAVACHOL) tablet 40 mg  40 mg Oral q1800 Nimish Luther Parody, MD   40 mg at 08/30/14 1813  . sodium chloride 0.9 %  injection 3 mL  3 mL Intravenous Q12H Nimish C Gosrani, MD   3 mL at 08/30/14 2125    Filed Vitals:   08/30/14 1800 08/30/14 2150 08/31/14 0443 08/31/14 0449  BP: 127/50 135/67  134/59  Pulse: 97 97  81  Temp: 100.2 F (37.9 C) 100.1 F (37.8 C)  99.2 F (37.3 C)  TempSrc: Oral Oral  Oral  Resp: 19 18  18   Height:      Weight:   132 lb 9.6 oz (60.147 kg)   SpO2: 94% 93%  93%    PHYSICAL EXAM General: NAD HEENT: Normal. Neck: +JVD, no thyromegaly.  Lungs: Diminished sounds b/l with crackles 1/4 up b/l. CV: Regular rate and rhythm, normal S1/S2, no S3/S4, no murmur.  No pretibial edema.   Abdomen: Soft, nontender, no distention.  Neurologic: Alert and oriented.  Psych: Normal affect. Musculoskeletal: No gross deformities. Extremities: No clubbing or cyanosis.     LABS: Basic Metabolic Panel:  Recent Labs  08/29/14 0725 08/31/14 0624  NA 139 136  K 3.4* 4.2  CL 99*  98*  CO2 28 29  GLUCOSE 100* 93  BUN 15 24*  CREATININE 0.62 0.86  CALCIUM 8.5* 8.6*   Liver Function Tests:  Recent Labs  08/28/14 1608 08/29/14 0725  AST 17 16  ALT 13* 10*  ALKPHOS 97 89  BILITOT 0.5 0.5  PROT 8.2* 8.0  ALBUMIN 2.9* 2.7*   No results for input(s): LIPASE, AMYLASE in the last 72 hours. CBC:  Recent Labs  08/28/14 1608 08/28/14 1913 08/29/14 0725  WBC 8.0 8.0 8.6  NEUTROABS 5.4  --   --   HGB 9.4* 9.6* 9.3*  HCT 30.3* 30.9* 29.3*  MCV 78.1 78.0 77.3*  PLT 295 343 311   Cardiac Enzymes:  Recent Labs  08/28/14 1913 08/29/14 0041 08/29/14 0725  TROPONINI 0.03 0.03 0.03   BNP: Invalid input(s): POCBNP D-Dimer: No results for input(s): DDIMER in the last 72 hours. Hemoglobin A1C: No results for input(s): HGBA1C in the last 72 hours. Fasting Lipid Panel: No results for input(s): CHOL, HDL, LDLCALC, TRIG, CHOLHDL, LDLDIRECT in the last 72 hours. Thyroid Function Tests: No results for input(s): TSH, T4TOTAL, T3FREE, THYROIDAB in the last 72  hours.  Invalid input(s): FREET3 Anemia Panel: No results for input(s): VITAMINB12, FOLATE, FERRITIN, TIBC, IRON, RETICCTPCT in the last 72 hours.  RADIOLOGY: Dg Chest 2 View  08/28/2014   CLINICAL DATA:  Cough, multiple episodes of pneumonia, history hypertension, breast cancer  EXAM: CHEST  2 VIEW  COMPARISON:  01/11/2014  FINDINGS: LEFT subclavian AICD leads project at RIGHT atrium, RIGHT ventricle and coronary sinus.  Enlargement of cardiac silhouette with pulmonary vascular congestion.  Atherosclerotic calcification aorta.  Minimal prominence of interstitial markings versus previous exam, question related to differences in technique though cannot completely exclude minimal diffuse edema.  No pleural effusion or pneumothorax.  Post LEFT mastectomy and axillary node dissection.  Bones demineralized.  IMPRESSION: Enlargement of cardiac silhouette with pulmonary vascular congestion post AICD.  Mild prominence of interstitial markings versus previous exam could be related to differences in technique but unable to exclude minimal pulmonary edema.   Electronically Signed   By: Lavonia Dana M.D.   On: 08/28/2014 15:56      ASSESSMENT AND PLAN: 1. Acute on chronic systolic heart failure: Continue IV Lasix 40 mg tid, along with Coreg and lisinopril. >1 L output in last 24 hours. Due to dietary and medication noncompliance. I will place a consult to [facility] as I think she would benefit from their assistance. Will add low-dose spironolactone and decrease KCl to 20 meq bid with close monitoring.  2. Essential HTN: Controlled. Monitor in light of addition of spironolactone. Continue lisinopril.  3. ICD: Stable. No need to interrogate at present.  4. Dietary and medication noncompliance: I will place a consult to [facility] as I think she would benefit from their assistance.  5. CAD: Symptomatically stable. Continue Coreg, ASA, and statin. Troponins normal.   Kate Sable, M.D., F.A.C.C.

## 2014-08-31 NOTE — Consult Note (Signed)
   [facility] Inpatient Consult   08/31/2014  MCKALEY SCHUPP [birth year] [id number]   [facility] Care Management referral received. Cross checked patient for eligibility for [facility] services. Patient is not eligible for [facility] Care Management at this time as she is not in a [facility] affiliated risk contract. Will make inpatient RNCM and ordering MD aware. Appreciative of referral however.  Marthenia Rolling, MSN-Ed, RN,BSN [facility] Liaison [phone]

## 2014-09-01 DIAGNOSIS — Z713 Dietary counseling and surveillance: Secondary | ICD-10-CM

## 2014-09-01 LAB — BASIC METABOLIC PANEL
Anion gap: 8 (ref 5–15)
BUN: 22 mg/dL — ABNORMAL HIGH (ref 6–20)
CALCIUM: 9.2 mg/dL (ref 8.9–10.3)
CO2: 30 mmol/L (ref 22–32)
CREATININE: 0.9 mg/dL (ref 0.44–1.00)
Chloride: 98 mmol/L — ABNORMAL LOW (ref 101–111)
GFR calc Af Amer: >60 mL/min (ref >60)
GLUCOSE: 98 mg/dL (ref 70–99)
Potassium: 4.2 mmol/L (ref 3.5–5.1)
Sodium: 136 mmol/L (ref 135–145)

## 2014-09-01 MED ORDER — FUROSEMIDE 10 MG/ML IJ SOLN
40.0000 mg | Freq: Three times a day (TID) | INTRAMUSCULAR | Status: DC
  Administered 2014-09-01 – 2014-09-02 (×5): 40 mg via INTRAVENOUS
  Filled 2014-09-01 (×6): qty 4

## 2014-09-01 MED ORDER — FUROSEMIDE 10 MG/ML IJ SOLN
80.0000 mg | Freq: Once | INTRAMUSCULAR | Status: AC
  Administered 2014-09-01: 80 mg via INTRAVENOUS
  Filled 2014-09-01: qty 8

## 2014-09-01 NOTE — Progress Notes (Signed)
Subjective: Patient feels better. She is diuresing well but she is still congested and coughing. No chest pain..  Objective: Vital signs in last 24 hours: Temp:  [98.8 F (37.1 C)-99.2 F (37.3 C)] 98.8 F (37.1 C) (05/11 0700) Pulse Rate:  [61-78] 76 (05/11 0700) Resp:  [18] 18 (05/11 0700) BP: (110-130)/(60-66) 130/60 mmHg (05/11 0700) SpO2:  [91 %-100 %] 100 % (05/11 0700) Weight:  [59.421 kg (131 lb)] 59.421 kg (131 lb) (05/11 0700) Weight change: -1.724 kg (-3 lb 12.8 oz) Last BM Date: 08/31/14  Intake/Output from previous day: 05/10 0701 - 05/11 0700 In: 850 [P.O.:840; I.V.:10] Out: 1250 [Urine:1250]  PHYSICAL EXAM General appearance: alert and no distress Resp: clear to auscultation bilaterally Cardio: S1, S2 normal GI: soft, non-tender; bowel sounds normal; no masses,  no organomegaly Extremities: extremities normal, atraumatic, no cyanosis or edema  Lab Results:  Results for orders placed or performed during the hospital encounter of 08/28/14 (from the past 48 hour(s))  Basic metabolic panel     Status: Abnormal   Collection Time: 08/31/14  6:24 AM  Result Value Ref Range   Sodium 136 135 - 145 mmol/L   Potassium 4.2 3.5 - 5.1 mmol/L    Comment: DELTA CHECK NOTED   Chloride 98 (L) 101 - 111 mmol/L   CO2 29 22 - 32 mmol/L   Glucose, Bld 93 70 - 99 mg/dL   BUN 24 (H) 6 - 20 mg/dL   Creatinine, Ser 0.86 0.44 - 1.00 mg/dL   Calcium 8.6 (L) 8.9 - 10.3 mg/dL   GFR calc non Af Amer >60 >60 mL/min   GFR calc Af Amer >60 >60 mL/min    Comment: (NOTE) The eGFR has been calculated using the CKD EPI equation. This calculation has not been validated in all clinical situations. eGFR's persistently <60 mL/min signify possible Chronic Kidney Disease.    Anion gap 9 5 - 15  Basic metabolic panel     Status: Abnormal   Collection Time: 09/01/14  6:50 AM  Result Value Ref Range   Sodium 136 135 - 145 mmol/L   Potassium 4.2 3.5 - 5.1 mmol/L   Chloride 98 (L) 101 - 111  mmol/L   CO2 30 22 - 32 mmol/L   Glucose, Bld 98 70 - 99 mg/dL   BUN 22 (H) 6 - 20 mg/dL   Creatinine, Ser 0.90 0.44 - 1.00 mg/dL   Calcium 9.2 8.9 - 10.3 mg/dL   GFR calc non Af Amer >60 >60 mL/min   GFR calc Af Amer >60 >60 mL/min    Comment: (NOTE) The eGFR has been calculated using the CKD EPI equation. This calculation has not been validated in all clinical situations. eGFR's persistently <60 mL/min signify possible Chronic Kidney Disease.    Anion gap 8 5 - 15    ABGS No results for input(s): PHART, PO2ART, TCO2, HCO3 in the last 72 hours.  Invalid input(s): PCO2 CULTURES No results found for this or any previous visit (from the past 240 hour(s)). Studies/Results: No results found.  Medications: I have reviewed the patient's current medications.  Assesment:   Active Problems:   Anemia of chronic disease   Hypertension   Biventricular ICD (implantable cardioverter-defibrillator) in place   CHF exacerbation   Systolic congestive heart failure    Plan:  Medications reviewed Continue IV lasix As per cardiology recommendation Will monitor BMP    LOS: 4 days   Socorro Kanitz 09/01/2014, 8:05 AM

## 2014-09-01 NOTE — Progress Notes (Signed)
Subjective:  Complains of coughing all day. Admits to eating too much salt at home.  Objective:  Vital Signs in the last 24 hours: Temp:  [98.8 F (37.1 C)-99.2 F (37.3 C)] 98.8 F (37.1 C) (05/11 0700) Pulse Rate:  [61-78] 76 (05/11 0700) Resp:  [18] 18 (05/11 0700) BP: (110-130)/(60-66) 130/60 mmHg (05/11 0700) SpO2:  [91 %-100 %] 100 % (05/11 0700) Weight:  [131 lb (59.421 kg)] 131 lb (59.421 kg) (05/11 0700)  Intake/Output from previous day: 05/10 0701 - 05/11 0700 In: 850 [P.O.:840; I.V.:10] Out: 1250 [Urine:1250] Intake/Output from this shift:    Physical Exam: NECK: Without JVD, HJR, or bruit LUNGS:Decreased breath sounds with bibasilar rales. HEART: Irreg rate and rhythm, 2/6 systolic murmur LSB,no gallop, rub, bruit, thrill, or heave EXTREMITIES: Without cyanosis, clubbing, or edema   Lab Results: No results for input(s): WBC, HGB, PLT in the last 72 hours.  Recent Labs  08/31/14 0624 09/01/14 0650  NA 136 136  K 4.2 4.2  CL 98* 98*  CO2 29 30  GLUCOSE 93 98  BUN 24* 22*  CREATININE 0.86 0.90   No results for input(s): TROPONINI in the last 72 hours.  Invalid input(s): CK, MB Hepatic Function Panel No results for input(s): PROT, ALBUMIN, AST, ALT, ALKPHOS, BILITOT, BILIDIR, IBILI in the last 72 hours. No results for input(s): CHOL in the last 72 hours. No results for input(s): PROTIME in the last 72 hours.    Cardiac Studies:  Assessment/Plan:  1. Acute on chronic systolic heart failure: Continue IV Lasix 40 mg tid, along with Coreg and lisinopril. >1 L output1st day but only 400 out yesterday.. Down 14 lbs if admission weight accurate. Will give extra dose of Lasix today. Due to dietary and medication noncompliance.[facility] consult put in.  low-dose spironolactone added and decrease KCl to 20 meq bid with close monitoring.  2. Essential HTN: Controlled. Monitor in light of addition of spironolactone. Continue lisinopril.  3. ICD: Stable. No need to  interrogate at present.  4. Dietary and medication noncompliance:  [facility] consult as she would benefit from their assistance.  5. CAD: Symptomatically stable. Continue Coreg, ASA, and statin. Troponins normal.     LOS: 4 days    Ermalinda Barrios 09/01/2014, 8:20 AM   The patient was seen and examined, and I agree with the assessment and plan as documented above, with modifications as noted below. Feeling better but still has cough/cold. Agree with extra dose of Lasix today. Did not qualify for [facility] assistance. I emphasized the importance of avoidance of salt. K normal. Continue Coreg, lisinopril, and spironolactone, along with ASA and statin.

## 2014-09-01 NOTE — Progress Notes (Signed)
UR chart review completed.  

## 2014-09-02 ENCOUNTER — Inpatient Hospital Stay (HOSPITAL_COMMUNITY): Payer: Medicare Other

## 2014-09-02 DIAGNOSIS — I255 Ischemic cardiomyopathy: Secondary | ICD-10-CM

## 2014-09-02 DIAGNOSIS — Z9861 Coronary angioplasty status: Secondary | ICD-10-CM

## 2014-09-02 DIAGNOSIS — I2510 Atherosclerotic heart disease of native coronary artery without angina pectoris: Secondary | ICD-10-CM

## 2014-09-02 DIAGNOSIS — I5189 Other ill-defined heart diseases: Secondary | ICD-10-CM | POA: Diagnosis present

## 2014-09-02 DIAGNOSIS — I5043 Acute on chronic combined systolic (congestive) and diastolic (congestive) heart failure: Principal | ICD-10-CM

## 2014-09-02 NOTE — Progress Notes (Signed)
Patients urine output not 100% documented, patient able to void some in hat and is measured, but also has stress incontinence and wears a pad- unable to measure this amount.

## 2014-09-02 NOTE — Progress Notes (Signed)
Subjective:  SOB improved  Objective:  Vital Signs in the last 24 hours: Temp:  [98.5 F (36.9 C)-100.5 F (38.1 C)] 99 F (37.2 C) (05/12 0625) Pulse Rate:  [60-79] 79 (05/12 0625) Resp:  [18-20] 20 (05/12 0625) BP: (111-127)/(53-58) 116/58 mmHg (05/12 0625) SpO2:  [92 %-100 %] 92 % (05/12 0625) Weight:  [131 lb 1.6 oz (59.467 kg)-133 lb 9.6 oz (60.601 kg)] 131 lb 1.6 oz (59.467 kg) (05/12 0754)  Intake/Output from previous day:  Intake/Output Summary (Last 24 hours) at 09/02/14 0836 Last data filed at 09/02/14 0800  Gross per 24 hour  Intake    480 ml  Output    900 ml  Net   -420 ml    Physical Exam: General appearance: alert, cooperative and no distress Neck: no carotid bruit and no JVD Lungs: bi basilar rales Heart: regular rate and rhythm and 2/6 systolic murmur Extremities: no edema Skin: Skin color, texture, turgor normal. No rashes or lesions Neurologic: Grossly normal   Rate: 75  Rhythm: paced  Lab Results: No results for input(s): WBC, HGB, PLT in the last 72 hours.  Recent Labs  08/31/14 0624 09/01/14 0650  NA 136 136  K 4.2 4.2  CL 98* 98*  CO2 29 30  GLUCOSE 93 98  BUN 24* 22*  CREATININE 0.86 0.90   No results for input(s): TROPONINI in the last 72 hours.  Invalid input(s): CK, MB No results for input(s): INR in the last 72 hours.  Scheduled Meds: . aspirin  81 mg Oral Daily  . carvedilol  6.25 mg Oral BID WC  . furosemide  40 mg Intravenous TID  . heparin  5,000 Units Subcutaneous 3 times per day  . hydroxychloroquine  100 mg Oral QPC supper  . hydroxychloroquine  200 mg Oral Daily  . lisinopril  40 mg Oral Daily  . pantoprazole  40 mg Oral Daily  . potassium chloride  20 mEq Oral BID  . pravastatin  40 mg Oral q1800  . sodium chloride  3 mL Intravenous Q12H  . spironolactone  12.5 mg Oral Daily   Continuous Infusions:  PRN Meds:.acetaminophen, albuterol, ondansetron **OR** ondansetron (ZOFRAN) IV   Imaging: Imaging  results have been reviewed  Cardiac Studies:  Assessment/Plan:  74 y.o.female with known hx of hypertension, CAD with PCI unknown artery, systolic CHF, LBBB s/p BiV ICD (Medtronic-Last device check 06/08/2014); microcytic anemia.  Admitted 08/27/13 with acute on chronic combined systolic and diastolic CHF secondary to dietary non compliance.Last echo March 2015 EF 30-35% with grade 2 DD . She has diuresed (? accurate wgt) 145-131lbs. I/O only negative 1L.   Principal Problem:   Acute on chronic combined systolic and diastolic congestive heart failure Active Problems:   Chronic systolic heart failure   Anemia of chronic disease   Hypertension   CAD S/P remote PCI- no details   Cardiomyopathy, ischemic-EF 30-35% March 123456   Diastolic dysfunction-grade 2   Biventricular ICD in place (MDT 2014)   CHF exacerbation   PLAN: She still has rales on exam. I'm not sure admission wgt was accurate. Consider f/u BNP and CXR before backing off on diuretics.   Kerin Ransom PA-C 09/02/2014, 8:36 AM [phone]  The patient was seen and examined, and I agree with the assessment and plan as documented above, with modifications as noted below. Says she is feeling well and denies shortness of breath. However, physical exam grossly unchanged with prominent rales/rhonchi bilaterally. Will obtain chest xray and  continue current diuretic regimen. Continue Coreg, lisinopril, and spironolactone, along with ASA and statin.

## 2014-09-02 NOTE — Progress Notes (Signed)
Subjective: Patient is stillcoughand feels congested. Overall improving and diuresising well.  Objective: Vital signs in last 24 hours: Temp:  [98.9 F (37.2 C)-100.5 F (38.1 C)] 98.9 F (37.2 C) (05/12 1525) Pulse Rate:  [60-79] 72 (05/12 1525) Resp:  [18-20] 20 (05/12 1525) BP: (105-116)/(47-58) 105/47 mmHg (05/12 1525) SpO2:  [92 %-100 %] 94 % (05/12 1525) Weight:  [59.467 kg (131 lb 1.6 oz)-60.601 kg (133 lb 9.6 oz)] 59.467 kg (131 lb 1.6 oz) (05/12 0754) Weight change: 1.179 kg (2 lb 9.6 oz) Last BM Date: 09/01/14  Intake/Output from previous day: 05/11 0701 - 05/12 0700 In: 600 [P.O.:600] Out: 800 [Urine:800]  PHYSICAL EXAM General appearance: alert and no distress Resp: clear to auscultation bilaterally Cardio: S1, S2 normal GI: soft, non-tender; bowel sounds normal; no masses,  no organomegaly Extremities: extremities normal, atraumatic, no cyanosis or edema  Lab Results:  Results for orders placed or performed during the hospital encounter of 08/28/14 (from the past 48 hour(s))  Basic metabolic panel     Status: Abnormal   Collection Time: 09/01/14  6:50 AM  Result Value Ref Range   Sodium 136 135 - 145 mmol/L   Potassium 4.2 3.5 - 5.1 mmol/L   Chloride 98 (L) 101 - 111 mmol/L   CO2 30 22 - 32 mmol/L   Glucose, Bld 98 70 - 99 mg/dL   BUN 22 (H) 6 - 20 mg/dL   Creatinine, Ser 0.90 0.44 - 1.00 mg/dL   Calcium 9.2 8.9 - 10.3 mg/dL   GFR calc non Af Amer >60 >60 mL/min   GFR calc Af Amer >60 >60 mL/min    Comment: (NOTE) The eGFR has been calculated using the CKD EPI equation. This calculation has not been validated in all clinical situations. eGFR's persistently <60 mL/min signify possible Chronic Kidney Disease.    Anion gap 8 5 - 15    ABGS No results for input(s): PHART, PO2ART, TCO2, HCO3 in the last 72 hours.  Invalid input(s): PCO2 CULTURES No results found for this or any previous visit (from the past 240 hour(s)). Studies/Results: Dg Chest 2  View  09/02/2014   CLINICAL DATA:  Congestive heart failure  EXAM: CHEST  2 VIEW  COMPARISON:  08/28/2014 and multiple prior studies  FINDINGS: Moderate cardiac enlargement. Cardiac pacer/ defibrillator with generator over left thorax. Calcification of the aortic arch. Vascular pattern normal. No evidence of pulmonary edema currently. Mild infiltrate left lower lobe likely atelectasis, although slightly more pronounced when compared to the prior study. There is stable mild bilateral chronic bronchitic change.  IMPRESSION: Stable cardiac enlargement with no evidence of pulmonary edema currently.  Left lower lobe infiltrate could represent pneumonitis or pneumonia.   Electronically Signed   By: Skipper Cliche M.D.   On: 09/02/2014 11:06    Medications: I have reviewed the patient's current medications.  Assesment:   Principal Problem:   Acute on chronic combined systolic and diastolic congestive heart failure Active Problems:   Chronic systolic heart failure   Anemia of chronic disease   Hypertension   Biventricular ICD in place (MDT 2014)   CHF exacerbation   CAD S/P remote PCI- no details   Cardiomyopathy, ischemic-EF 30-35% March 5631   Diastolic dysfunction-grade 2    Plan:  Medications reviewed Continue IV lasix As per cardiology recommendation Will monitor BMP    LOS: 5 days   [name] 09/02/2014, 4:10 PM

## 2014-09-02 NOTE — Care Management Note (Signed)
Case Management Note  Patient Details  Name: [name] MRN: [id number] Date of Birth: [birth year]  Subjective/Objective:                    Action/Plan:   Expected Discharge Date:  08/30/14               Expected Discharge Plan:  Home/Self Care  In-House Referral:  NA  Discharge planning Services  CM Consult  Post Acute Care Choice:  NA Choice offered to:  NA  DME Arranged:    DME Agency:     HH Arranged:    [place] Agency:     Status of Service:  Completed, signed off  Medicare Important Message Given:    Date Medicare IM Given:    Medicare IM give by:    Date Additional Medicare IM Given:    Additional Medicare Important Message give by:     If discussed at [place] of Stay Meetings, dates discussed:  09/02/14  Additional Comments:  Joylene Draft, RN 09/02/2014, 3:35 PM

## 2014-09-03 DIAGNOSIS — R938 Abnormal findings on diagnostic imaging of other specified body structures: Secondary | ICD-10-CM

## 2014-09-03 LAB — BASIC METABOLIC PANEL
Anion gap: 10 (ref 5–15)
BUN: 34 mg/dL — AB (ref 6–20)
CHLORIDE: 95 mmol/L — AB (ref 101–111)
CO2: 25 mmol/L (ref 22–32)
CREATININE: 1.32 mg/dL — AB (ref 0.44–1.00)
Calcium: 9.2 mg/dL (ref 8.9–10.3)
GFR calc non Af Amer: 38 mL/min — ABNORMAL LOW (ref >60)
GFR, EST AFRICAN AMERICAN: 45 mL/min — AB (ref >60)
Glucose, Bld: 139 mg/dL — ABNORMAL HIGH (ref 65–99)
Potassium: 5.1 mmol/L (ref 3.5–5.1)
Sodium: 130 mmol/L — ABNORMAL LOW (ref 135–145)

## 2014-09-03 MED ORDER — LEVOFLOXACIN 500 MG PO TABS
500.0000 mg | ORAL_TABLET | Freq: Every day | ORAL | Status: DC
  Administered 2014-09-03 – 2014-09-04 (×2): 500 mg via ORAL
  Filled 2014-09-03 (×2): qty 1

## 2014-09-03 MED ORDER — FUROSEMIDE 40 MG PO TABS
40.0000 mg | ORAL_TABLET | Freq: Two times a day (BID) | ORAL | Status: DC
  Administered 2014-09-03: 40 mg via ORAL
  Filled 2014-09-03: qty 1

## 2014-09-03 NOTE — Care Management Note (Signed)
Case Management Note  Patient Details  Name: [name] MRN: [id number] Date of Birth: 12-26-1940  Subjective/Objective:                    Action/Plan:   Expected Discharge Date:  08/30/14               Expected Discharge Plan:  Home/Self Care  In-House Referral:  NA  Discharge planning Services  CM Consult  Post Acute Care Choice:  NA Choice offered to:  NA  DME Arranged:    DME Agency:     HH Arranged:    [place] Agency:     Status of Service:  Completed, signed off  Medicare Important Message Given:  Yes Date Medicare IM Given:  09/03/14 Medicare IM give by:  Christinia Gully, RN BSN CM Date Additional Medicare IM Given:    Additional Medicare Important Message give by:     If discussed at [place] of Stay Meetings, dates discussed:    Additional Comments: Anticipate discharge over the weekend. If pt has any home health needs, weekend staff can arrange.  Christinia Gully Tome, RN 09/03/2014, 2:45 PM

## 2014-09-03 NOTE — Progress Notes (Signed)
Consulting cardiologist: Kate Sable MD Primary Cardiologist: Cristopher Peru MD  Cardiology Specific Problem List: 1. CAD-Known hx of PCI 2. Systolic CHF 3. BiV ICD in situ (Medtronic)   Subjective:    Feeling and breathing much better. Wants to go home.  Objective:   Temp:  [98.7 F (37.1 C)-99.5 F (37.5 C)] 98.7 F (37.1 C) (05/13 0700) Pulse Rate:  [71-87] 71 (05/13 0700) Resp:  [16-20] 16 (05/13 0700) BP: (105-125)/(47-61) 112/61 mmHg (05/13 0700) SpO2:  [93 %-96 %] 94 % (05/13 0700) Weight:  [129 lb 11.2 oz (58.832 kg)] 129 lb 11.2 oz (58.832 kg) (05/13 0514) Last BM Date: 09/02/14  Filed Weights   09/02/14 0625 09/02/14 0754 09/03/14 0514  Weight: 133 lb 9.6 oz (60.601 kg) 131 lb 1.6 oz (59.467 kg) 129 lb 11.2 oz (58.832 kg)    Intake/Output Summary (Last 24 hours) at 09/03/14 0918 Last data filed at 09/03/14 0813  Gross per 24 hour  Intake    360 ml  Output    825 ml  Net   -465 ml    Telemetry: Paced rhythm.  Exam:  General: No acute distress.  HEENT: Conjunctiva and lids normal, oropharynx clear.  Lungs: Some mild bibasilar crackles.  Cardiac: No elevated JVP or bruits. RRR, 2/6 systolic murmur, no gallop or rub.   Abdomen: Normoactive bowel sounds, nontender, nondistended.  Extremities: No pitting edema, distal pulses full.  Neuropsychiatric: Alert and oriented x3, affect appropriate. Hard of hearing.    Lab Results:  Basic Metabolic Panel:  Recent Labs Lab 08/29/14 0725 08/31/14 0624 09/01/14 0650  NA 139 136 136  K 3.4* 4.2 4.2  CL 99* 98* 98*  CO2 28 29 30   GLUCOSE 100* 93 98  BUN 15 24* 22*  CREATININE 0.62 0.86 0.90  CALCIUM 8.5* 8.6* 9.2    Liver Function Tests:  Recent Labs Lab 08/28/14 1608 08/29/14 0725  AST 17 16  ALT 13* 10*  ALKPHOS 97 89  BILITOT 0.5 0.5  PROT 8.2* 8.0  ALBUMIN 2.9* 2.7*    CBC:  Recent Labs Lab 08/28/14 1608 08/28/14 1913 08/29/14 0725  WBC 8.0 8.0 8.6  HGB 9.4* 9.6*  9.3*  HCT 30.3* 30.9* 29.3*  MCV 78.1 78.0 77.3*  PLT 295 343 311    Cardiac Enzymes:  Recent Labs Lab 08/28/14 1913 08/29/14 0041 08/29/14 0725  TROPONINI 0.03 0.03 0.03    Radiology: Dg Chest 2 View  09/02/2014   CLINICAL DATA:  Congestive heart failure  EXAM: CHEST  2 VIEW  COMPARISON:  08/28/2014 and multiple prior studies  FINDINGS: Moderate cardiac enlargement. Cardiac pacer/ defibrillator with generator over left thorax. Calcification of the aortic arch. Vascular pattern normal. No evidence of pulmonary edema currently. Mild infiltrate left lower lobe likely atelectasis, although slightly more pronounced when compared to the prior study. There is stable mild bilateral chronic bronchitic change.  IMPRESSION: Stable cardiac enlargement with no evidence of pulmonary edema currently.  Left lower lobe infiltrate could represent pneumonitis or pneumonia.   Electronically Signed   By: Skipper Cliche M.D.   On: 09/02/2014 11:06     Medications:   Scheduled Medications: . aspirin  81 mg Oral Daily  . carvedilol  6.25 mg Oral BID WC  . furosemide  40 mg Intravenous TID  . heparin  5,000 Units Subcutaneous 3 times per day  . hydroxychloroquine  100 mg Oral QPC supper  . hydroxychloroquine  200 mg Oral Daily  . lisinopril  40  mg Oral Daily  . pantoprazole  40 mg Oral Daily  . potassium chloride  20 mEq Oral BID  . pravastatin  40 mg Oral q1800  . sodium chloride  3 mL Intravenous Q12H  . spironolactone  12.5 mg Oral Daily      PRN Medications: acetaminophen, albuterol, ondansetron **OR** ondansetron (ZOFRAN) IV   Assessment and Plan:   1.Acute on Chronic Systolic CHF: She appears compensated on exam. She has diuresed from admission wt of 133 to 129 if weights are accurate. He denies further dyspnea or chest pressure. Will transition to po Lasix 40 BID. Creatinine 0.90 CO2 30. Will make follow up appt in our office if she is discharged over the weekend. Continue carvedilol.  Consider adding low dose spironolactone.   2. BiV in situ: Keep appt with Dr. Lovena Le for ongoing pacemaker interrogation.   3. Anemia: Chronic: Stable.   Phill Myron. [place] NP [place]  09/03/2014, 9:18 AM   The patient was seen and examined, and I agree with the assessment and plan as documented above, with modifications as noted below. Pt feeling better. CXR shows LLL infiltrate and no pulmonary edema. Switch back to oral Lasix. No further recommendations. Will sign off.

## 2014-09-03 NOTE — Progress Notes (Signed)
Subjective: Patient is resting. She is cough and congested. Her breathing is better. Repeat chest x-ray showed enlarged cardiac without pulmonary edema. Patient has LLL infiltrate possibly pneumonia  Objective: Vital signs in last 24 hours: Temp:  [98.9 F (37.2 C)-99.5 F (37.5 C)] 98.9 F (37.2 C) (05/13 0514) Pulse Rate:  [72-87] 80 (05/13 0514) Resp:  [20] 20 (05/13 0514) BP: (105-125)/(47-57) 125/57 mmHg (05/13 0514) SpO2:  [93 %-96 %] 93 % (05/13 0514) Weight:  [58.832 kg (129 lb 11.2 oz)-59.467 kg (131 lb 1.6 oz)] 58.832 kg (129 lb 11.2 oz) (05/13 0514) Weight change: -1.134 kg (-2 lb 8 oz) Last BM Date: 09/01/14  Intake/Output from previous day: 05/12 0701 - 05/13 0700 In: 600 [P.O.:600] Out: 400 [Urine:400]  PHYSICAL EXAM General appearance: alert and no distress Resp: clear to auscultation bilaterally Cardio: S1, S2 normal GI: soft, non-tender; bowel sounds normal; no masses,  no organomegaly Extremities: extremities normal, atraumatic, no cyanosis or edema  Lab Results:  No results found for this or any previous visit (from the past 48 hour(s)).  ABGS No results for input(s): PHART, PO2ART, TCO2, HCO3 in the last 72 hours.  Invalid input(s): PCO2 CULTURES No results found for this or any previous visit (from the past 240 hour(s)). Studies/Results: Dg Chest 2 View  09/02/2014   CLINICAL DATA:  Congestive heart failure  EXAM: CHEST  2 VIEW  COMPARISON:  08/28/2014 and multiple prior studies  FINDINGS: Moderate cardiac enlargement. Cardiac pacer/ defibrillator with generator over left thorax. Calcification of the aortic arch. Vascular pattern normal. No evidence of pulmonary edema currently. Mild infiltrate left lower lobe likely atelectasis, although slightly more pronounced when compared to the prior study. There is stable mild bilateral chronic bronchitic change.  IMPRESSION: Stable cardiac enlargement with no evidence of pulmonary edema currently.  Left lower lobe  infiltrate could represent pneumonitis or pneumonia.   Electronically Signed   By: Skipper Cliche M.D.   On: 09/02/2014 11:06    Medications: I have reviewed the patient's current medications.  Assesment:   Principal Problem:   Acute on chronic combined systolic and diastolic congestive heart failure Active Problems:   Chronic systolic heart failure   Anemia of chronic disease   Hypertension   Biventricular ICD in place (MDT 2014)   CHF exacerbation   CAD S/P remote PCI- no details   Cardiomyopathy, ischemic-EF 30-35% March 123456   Diastolic dysfunction-grade 2 ?? LLL infiltrate R/O pneumonia   Plan:  Medications reviewed Continue IV lasix Will monitor BMP Pulmonary consult for evaluation of LLL infiltrate ??after pulmonary evaluation possible antibiotics    LOS: 6 days   Jerimey Burridge 09/03/2014, 7:42 AM

## 2014-09-03 NOTE — Consult Note (Signed)
Consult requested by: Dr. Legrand Rams Consult requested for pneumonia:  HPI: This is a 74 year old who came to the hospital with pulmonary edema due to exacerbation of systolic congestive heart failure. She is improving but chest x-ray showed an infiltrate suggestive of pneumonia. She has not had any fever. She is coughing. She says she feels some chest congestion. She is coughing up a little bit of discolored sputum. She has not had any pleurisy. No hemoptysis.  Past Medical History  Diagnosis Date  . Arteriosclerotic cardiovascular disease (ASCVD)     Remote PTCA by patient report; LBBB; associated cardiomyopathy, presumed ischemic with EF 40-45% previously, 20% in 06/2009; h/o clinical congestive heart failure; negative stress nuclear in 2009 with inferoseptal and apical scar  . Anemia     Hgb of 9-10  . Hyperlipidemia   . Breast cancer   . Hypertension   . Automatic implantable cardioverter-defibrillator in situ   . HOH (hard of hearing)   . Anemia of chronic disease     Hgb of 9-10 chronically; 06/2010: H&H-10.7/33.5, MCV-81, normal iron studies in 2010      Family History  Problem Relation Age of Onset  . Diabetes Father   . Cancer Father   . Hypertension Brother      History   Social History  . Marital Status: Married    Spouse Name: N/A  . Number of Children: N/A  . Years of Education: N/A   Occupational History  . Retired    Social History Main Topics  . Smoking status: Former Smoker    Types: Cigarettes  . Smokeless tobacco: Current User    Types: Chew     Comment: 07/03/2013 "smoked some; don't know how much or how long or when I quit"  . Alcohol Use: No  . Drug Use: No  . Sexual Activity: Not Currently   Other Topics Concern  . None   Social History Narrative     ROS: Except as mentioned is negative and per the history and physical    Objective: Vital signs in last 24 hours: Temp:  [98.7 F (37.1 C)-99.5 F (37.5 C)] 98.7 F (37.1 C) (05/13  0700) Pulse Rate:  [71-87] 71 (05/13 0700) Resp:  [16-20] 16 (05/13 0700) BP: (105-125)/(47-61) 112/61 mmHg (05/13 0700) SpO2:  [93 %-96 %] 94 % (05/13 0700) Weight:  [58.832 kg (129 lb 11.2 oz)] 58.832 kg (129 lb 11.2 oz) (05/13 0514) Weight change: -1.134 kg (-2 lb 8 oz) Last BM Date: 09/02/14  Intake/Output from previous day: 05/12 0701 - 05/13 0700 In: 600 [P.O.:600] Out: 400 [Urine:400]  PHYSICAL EXAM She is awake and alert. She does not appear to be in any acute distress. She is hard of hearing. Her HEENT exam otherwise is essentially unremarkable. Her chest shows some rhonchi bilaterally but no definite rales. Her heart is regular without gallop. Her abdomen is soft without masses. Extremities showed no edema. Central nervous system exam except for the hard of hearing is normal  Lab Results: Basic Metabolic Panel:  Recent Labs  09/01/14 0650  NA 136  K 4.2  CL 98*  CO2 30  GLUCOSE 98  BUN 22*  CREATININE 0.90  CALCIUM 9.2   Liver Function Tests: No results for input(s): AST, ALT, ALKPHOS, BILITOT, PROT, ALBUMIN in the last 72 hours. No results for input(s): LIPASE, AMYLASE in the last 72 hours. No results for input(s): AMMONIA in the last 72 hours. CBC: No results for input(s): WBC, NEUTROABS, HGB, HCT, MCV, PLT  in the last 72 hours. Cardiac Enzymes: No results for input(s): CKTOTAL, CKMB, CKMBINDEX, TROPONINI in the last 72 hours. BNP: No results for input(s): PROBNP in the last 72 hours. D-Dimer: No results for input(s): DDIMER in the last 72 hours. CBG: No results for input(s): GLUCAP in the last 72 hours. Hemoglobin A1C: No results for input(s): HGBA1C in the last 72 hours. Fasting Lipid Panel: No results for input(s): CHOL, HDL, LDLCALC, TRIG, CHOLHDL, LDLDIRECT in the last 72 hours. Thyroid Function Tests: No results for input(s): TSH, T4TOTAL, FREET4, T3FREE, THYROIDAB in the last 72 hours. Anemia Panel: No results for input(s): VITAMINB12, FOLATE,  FERRITIN, TIBC, IRON, RETICCTPCT in the last 72 hours. Coagulation: No results for input(s): LABPROT, INR in the last 72 hours. Urine Drug Screen: Drugs of Abuse  No results found for: LABOPIA, COCAINSCRNUR, LABBENZ, AMPHETMU, THCU, LABBARB  Alcohol Level: No results for input(s): ETH in the last 72 hours. Urinalysis: No results for input(s): COLORURINE, LABSPEC, PHURINE, GLUCOSEU, HGBUR, BILIRUBINUR, KETONESUR, PROTEINUR, UROBILINOGEN, NITRITE, LEUKOCYTESUR in the last 72 hours.  Invalid input(s): APPERANCEUR Misc. Labs:   ABGS: No results for input(s): PHART, PO2ART, TCO2, HCO3 in the last 72 hours.  Invalid input(s): PCO2   MICROBIOLOGY: No results found for this or any previous visit (from the past 240 hour(s)).  Studies/Results: Dg Chest 2 View  09/02/2014   CLINICAL DATA:  Congestive heart failure  EXAM: CHEST  2 VIEW  COMPARISON:  08/28/2014 and multiple prior studies  FINDINGS: Moderate cardiac enlargement. Cardiac pacer/ defibrillator with generator over left thorax. Calcification of the aortic arch. Vascular pattern normal. No evidence of pulmonary edema currently. Mild infiltrate left lower lobe likely atelectasis, although slightly more pronounced when compared to the prior study. There is stable mild bilateral chronic bronchitic change.  IMPRESSION: Stable cardiac enlargement with no evidence of pulmonary edema currently.  Left lower lobe infiltrate could represent pneumonitis or pneumonia.   Electronically Signed   By: Skipper Cliche M.D.   On: 09/02/2014 11:06    Medications:  Prior to Admission:  Prescriptions prior to admission  Medication Sig Dispense Refill Last Dose  . acetaminophen (TYLENOL) 500 MG tablet Take 1,000 mg by mouth every 6 (six) hours as needed.    08/28/2014  . aspirin 325 MG EC tablet Take 325 mg by mouth daily.   08/28/2014  . carvedilol (COREG) 6.25 MG tablet TAKE 1 TABLET (6.25 MG TOTAL) BY MOUTH 2 (TWO) TIMES DAILY WITH A MEAL. 60 tablet 5  08/28/2014 at 12:00  . furosemide (LASIX) 40 MG tablet TAKE 1 TABLET BY MOUTH TWICE A DAY 60 tablet 6 08/28/2014  . hydroxychloroquine (PLAQUENIL) 200 MG tablet Take 100-200 mg by mouth 2 (two) times daily. Takes a whole tablet in the morning and half a tablet in the evening .   08/28/2014  . lisinopril (PRINIVIL,ZESTRIL) 40 MG tablet TAKE 1 TABLET BY MOUTH EVERY DAY 30 tablet 6 08/28/2014  . lovastatin (MEVACOR) 40 MG tablet Take 1 tablet (40 mg total) by mouth at bedtime. 30 tablet 1 08/27/2014  . omeprazole (PRILOSEC) 20 MG capsule Take 20 mg by mouth daily. For gas/heartburn   08/28/2014  . PROAIR HFA 108 (90 BASE) MCG/ACT inhaler Inhale 2 puffs into the lungs every 4 (four) hours as needed for wheezing or shortness of breath.    08/27/2014  . ibuprofen (ADVIL,MOTRIN) 200 MG tablet Take 1 tablet (200 mg total) by mouth every 6 (six) hours as needed for fever, headache, mild pain or moderate  pain. (Patient not taking: Reported on 08/28/2014)   Taking  . nitroGLYCERIN (NITROSTAT) 0.4 MG SL tablet Place 1 tablet (0.4 mg total) under the tongue every 5 (five) minutes as needed for chest pain. (Patient not taking: Reported on 06/03/2014) 25 tablet 4 Not Taking  . potassium chloride (K-DUR) 10 MEQ tablet Take 1 tablet (10 mEq total) by mouth 2 (two) times daily. (Patient not taking: Reported on 06/03/2014) 60 tablet 0 Not Taking  . predniSONE (DELTASONE) 5 MG tablet 5 mg daily.    Not Taking   Scheduled: . aspirin  81 mg Oral Daily  . carvedilol  6.25 mg Oral BID WC  . furosemide  40 mg Oral BID  . heparin  5,000 Units Subcutaneous 3 times per day  . hydroxychloroquine  100 mg Oral QPC supper  . hydroxychloroquine  200 mg Oral Daily  . lisinopril  40 mg Oral Daily  . pantoprazole  40 mg Oral Daily  . potassium chloride  20 mEq Oral BID  . pravastatin  40 mg Oral q1800  . sodium chloride  3 mL Intravenous Q12H  . spironolactone  12.5 mg Oral Daily   Continuous:  [id number], albuterol, ondansetron **OR**  ondansetron (ZOFRAN) IV  Assesment: She doesn't have typical history for pneumonia but her chest x-ray is certainly suggestive. She does have acute on chronic combined systolic and diastolic heart failure which has improved. I think it's reasonable to treat her with a course of Levaquin by mouth. Principal Problem:   Acute on chronic combined systolic and diastolic congestive heart failure Active Problems:   Chronic systolic heart failure   Anemia of chronic disease   Hypertension   Biventricular ICD in place (MDT 2014)   CHF exacerbation   CAD S/P remote PCI- no details   Cardiomyopathy, ischemic-EF 30-35% March [id number]   Diastolic dysfunction-grade 2    Plan: Start by mouth Levaquin. She is apparently doing better otherwise.    LOS: 6 days   Blaike Newburn L 09/03/2014, 10:14 AM

## 2014-09-04 LAB — BASIC METABOLIC PANEL
ANION GAP: 11 (ref 5–15)
Anion gap: 13 (ref 5–15)
BUN: 35 mg/dL — ABNORMAL HIGH (ref 6–20)
BUN: 40 mg/dL — ABNORMAL HIGH (ref 6–20)
CHLORIDE: 95 mmol/L — AB (ref 101–111)
CO2: 24 mmol/L (ref 22–32)
CO2: 23 mmol/L (ref 22–32)
CREATININE: 1.94 mg/dL — AB (ref 0.44–1.00)
Calcium: 9.3 mg/dL (ref 8.9–10.3)
Calcium: 9.3 mg/dL (ref 8.9–10.3)
Chloride: 97 mmol/L — ABNORMAL LOW (ref 101–111)
Creatinine, Ser: 1.46 mg/dL — ABNORMAL HIGH (ref 0.44–1.00)
GFR calc Af Amer: 39 mL/min — ABNORMAL LOW (ref >60)
GFR calc Af Amer: 28 mL/min — ABNORMAL LOW (ref >60)
GFR calc non Af Amer: 24 mL/min — ABNORMAL LOW (ref >60)
GFR, EST NON AFRICAN AMERICAN: 34 mL/min — AB (ref >60)
GLUCOSE: 84 mg/dL (ref 65–99)
GLUCOSE: 126 mg/dL — AB (ref 65–99)
POTASSIUM: 4.9 mmol/L (ref 3.5–5.1)
Potassium: 5.9 mmol/L — ABNORMAL HIGH (ref 3.5–5.1)
SODIUM: 130 mmol/L — AB (ref 135–145)
Sodium: 133 mmol/L — ABNORMAL LOW (ref 135–145)

## 2014-09-04 MED ORDER — SODIUM POLYSTYRENE SULFONATE 15 GM/60ML PO SUSP
30.0000 g | Freq: Once | ORAL | Status: AC
  Administered 2014-09-04: 30 g via ORAL
  Filled 2014-09-04: qty 120

## 2014-09-04 MED ORDER — LEVOFLOXACIN 250 MG PO TABS
250.0000 mg | ORAL_TABLET | Freq: Every day | ORAL | Status: DC
  Administered 2014-09-05 – 2014-09-06 (×2): 250 mg via ORAL
  Filled 2014-09-04 (×2): qty 1

## 2014-09-04 NOTE — Progress Notes (Signed)
Subjective: She says she feels okay. She has some nausea. She has no other new complaints.  Objective: Vital signs in last 24 hours: Temp:  [98 F (36.7 C)-98.5 F (36.9 C)] 98 F (36.7 C) (05/14 8416) Pulse Rate:  [75-86] 75 (05/14 0642) Resp:  [17-18] 17 (05/14 0642) BP: (124-131)/(56-58) 124/58 mmHg (05/14 0642) SpO2:  [93 %-96 %] 96 % (05/14 0642) Weight:  [58.605 kg (129 lb 3.2 oz)] 58.605 kg (129 lb 3.2 oz) (05/14 0709) Weight change:  Last BM Date: 09/03/14  Intake/Output from previous day: 05/13 0701 - 05/14 0700 In: 603 [P.O.:600; I.V.:3] Out: 750 [Urine:750]  PHYSICAL EXAM General appearance: alert, cooperative, no distress and Hard of hearing Resp: clear to auscultation bilaterally Cardio: regular rate and rhythm, S1, S2 normal, no murmur, click, rub or gallop GI: soft, non-tender; bowel sounds normal; no masses,  no organomegaly Extremities: extremities normal, atraumatic, no cyanosis or edema  Lab Results:  Results for orders placed or performed during the hospital encounter of 08/28/14 (from the past 48 hour(s))  Basic metabolic panel     Status: Abnormal   Collection Time: 09/03/14 10:39 AM  Result Value Ref Range   Sodium 130 (L) 135 - 145 mmol/L   Potassium 5.1 3.5 - 5.1 mmol/L   Chloride 95 (L) 101 - 111 mmol/L   CO2 25 22 - 32 mmol/L   Glucose, Bld 139 (H) 65 - 99 mg/dL   BUN 34 (H) 6 - 20 mg/dL   Creatinine, Ser 1.32 (H) 0.44 - 1.00 mg/dL   Calcium 9.2 8.9 - 10.3 mg/dL   GFR calc non Af Amer 38 (L) >60 mL/min   GFR calc Af Amer 45 (L) >60 mL/min    Comment: (NOTE) The eGFR has been calculated using the CKD EPI equation. This calculation has not been validated in all clinical situations. eGFR's persistently <60 mL/min signify possible Chronic Kidney Disease.    Anion gap 10 5 - 15  Basic metabolic panel     Status: Abnormal   Collection Time: 09/04/14  6:25 AM  Result Value Ref Range   Sodium 130 (L) 135 - 145 mmol/L   Potassium 5.9 (H) 3.5 -  5.1 mmol/L   Chloride 95 (L) 101 - 111 mmol/L   CO2 24 22 - 32 mmol/L   Glucose, Bld 84 65 - 99 mg/dL   BUN 35 (H) 6 - 20 mg/dL   Creatinine, Ser 1.46 (H) 0.44 - 1.00 mg/dL   Calcium 9.3 8.9 - 10.3 mg/dL   GFR calc non Af Amer 34 (L) >60 mL/min   GFR calc Af Amer 39 (L) >60 mL/min    Comment: (NOTE) The eGFR has been calculated using the CKD EPI equation. This calculation has not been validated in all clinical situations. eGFR's persistently <60 mL/min signify possible Chronic Kidney Disease.    Anion gap 11 5 - 15    ABGS No results for input(s): PHART, PO2ART, TCO2, HCO3 in the last 72 hours.  Invalid input(s): PCO2 CULTURES No results found for this or any previous visit (from the past 240 hour(s)). Studies/Results: No results found.  Medications:  Prior to Admission:  Prescriptions prior to admission  Medication Sig Dispense Refill Last Dose  . acetaminophen (TYLENOL) 500 MG tablet Take 1,000 mg by mouth every 6 (six) hours as needed.    08/28/2014  . aspirin 325 MG EC tablet Take 325 mg by mouth daily.   08/28/2014  . carvedilol (COREG) 6.25 MG tablet TAKE 1  TABLET (6.25 MG TOTAL) BY MOUTH 2 (TWO) TIMES DAILY WITH A MEAL. 60 tablet 5 08/28/2014 at 12:00  . furosemide (LASIX) 40 MG tablet TAKE 1 TABLET BY MOUTH TWICE A DAY 60 tablet 6 08/28/2014  . hydroxychloroquine (PLAQUENIL) 200 MG tablet Take 100-200 mg by mouth 2 (two) times daily. Takes a whole tablet in the morning and half a tablet in the evening .   08/28/2014  . lisinopril (PRINIVIL,ZESTRIL) 40 MG tablet TAKE 1 TABLET BY MOUTH EVERY DAY 30 tablet 6 08/28/2014  . lovastatin (MEVACOR) 40 MG tablet Take 1 tablet (40 mg total) by mouth at bedtime. 30 tablet 1 08/27/2014  . omeprazole (PRILOSEC) 20 MG capsule Take 20 mg by mouth daily. For gas/heartburn   08/28/2014  . PROAIR HFA 108 (90 BASE) MCG/ACT inhaler Inhale 2 puffs into the lungs every 4 (four) hours as needed for wheezing or shortness of breath.    08/27/2014  . ibuprofen  (ADVIL,MOTRIN) 200 MG tablet Take 1 tablet (200 mg total) by mouth every 6 (six) hours as needed for fever, headache, mild pain or moderate pain. (Patient not taking: Reported on 08/28/2014)   Taking  . nitroGLYCERIN (NITROSTAT) 0.4 MG SL tablet Place 1 tablet (0.4 mg total) under the tongue every 5 (five) minutes as needed for chest pain. (Patient not taking: Reported on 06/03/2014) 25 tablet 4 Not Taking  . potassium chloride (K-DUR) 10 MEQ tablet Take 1 tablet (10 mEq total) by mouth 2 (two) times daily. (Patient not taking: Reported on 06/03/2014) 60 tablet 0 Not Taking  . predniSONE (DELTASONE) 5 MG tablet 5 mg daily.    Not Taking   Scheduled: . aspirin  81 mg Oral Daily  . carvedilol  6.25 mg Oral BID WC  . heparin  5,000 Units Subcutaneous 3 times per day  . hydroxychloroquine  100 mg Oral QPC supper  . hydroxychloroquine  200 mg Oral Daily  . levofloxacin  500 mg Oral Daily  . pantoprazole  40 mg Oral Daily  . pravastatin  40 mg Oral q1800  . sodium chloride  3 mL Intravenous Q12H  . sodium polystyrene  30 g Oral Once   Continuous:  EHM:[id number], albuterol, ondansetron **OR** ondansetron (ZOFRAN) IV  Assesment: She was admitted with acute on chronic combined systolic and diastolic CHF. She has improved from that but she now has what looks like acute kidney injury probably related to diuresis. Her potassium was also elevated. She does not have any changes on monitor strip related to her potassium. She is somewhat nauseated but otherwise asymptomatic. Principal Problem:   Acute on chronic combined systolic and diastolic congestive heart failure Active Problems:   Chronic systolic heart failure   Anemia of chronic disease   Hypertension   Biventricular ICD in place (MDT 2014)   CHF exacerbation   CAD S/P remote PCI- no details   Cardiomyopathy, ischemic-EF 30-35% March 9476   Diastolic dysfunction-grade 2    Plan: I will discontinue lisinopril for now. Discontinue Lasix and  spironolactone for now. She will have Kayexalate. Repeat her potassium level after Kayexalate given and again in the morning    LOS: 7 days   Avari Nevares L 09/04/2014, 10:06 AM

## 2014-09-05 ENCOUNTER — Other Ambulatory Visit: Payer: Self-pay | Admitting: Cardiovascular Disease

## 2014-09-05 LAB — BASIC METABOLIC PANEL
Anion gap: 14 (ref 5–15)
BUN: 39 mg/dL — AB (ref 6–20)
CHLORIDE: 95 mmol/L — AB (ref 101–111)
CO2: 23 mmol/L (ref 22–32)
CREATININE: 1.49 mg/dL — AB (ref 0.44–1.00)
Calcium: 9.2 mg/dL (ref 8.9–10.3)
GFR calc Af Amer: 38 mL/min — ABNORMAL LOW (ref >60)
GFR, EST NON AFRICAN AMERICAN: 33 mL/min — AB (ref >60)
Glucose, Bld: 84 mg/dL (ref 65–99)
POTASSIUM: 4.5 mmol/L (ref 3.5–5.1)
SODIUM: 132 mmol/L — AB (ref 135–145)

## 2014-09-05 MED ORDER — FUROSEMIDE 10 MG/ML IJ SOLN
20.0000 mg | Freq: Once | INTRAMUSCULAR | Status: AC
  Administered 2014-09-05: 20 mg via INTRAVENOUS
  Filled 2014-09-05: qty 2

## 2014-09-05 NOTE — Progress Notes (Signed)
Subjective: She feels okay. She's not complaining of shortness of breath.  Objective: Vital signs in last 24 hours: Temp:  [97.9 F (36.6 C)-98.6 F (37 C)] 98.6 F (37 C) (05/15 0620) Pulse Rate:  [71-81] 81 (05/15 0620) Resp:  [16] 16 (05/15 0620) BP: (111-129)/(39-53) 129/53 mmHg (05/15 0620) SpO2:  [96 %-100 %] 100 % (05/15 0620) Weight:  [58.8 kg (129 lb 10.1 oz)] 58.8 kg (129 lb 10.1 oz) (05/15 0620) Weight change:  Last BM Date: 09/04/14  Intake/Output from previous day: 05/14 0701 - 05/15 0700 In: 606 [P.O.:600; I.V.:6] Out: 900 [Urine:900]  PHYSICAL EXAM General appearance: alert, cooperative and no distress Resp: clear to auscultation bilaterally Cardio: regular rate and rhythm, S1, S2 normal, no murmur, click, rub or gallop GI: soft, non-tender; bowel sounds normal; no masses,  no organomegaly Extremities: extremities normal, atraumatic, no cyanosis or edema  Lab Results:  Results for orders placed or performed during the hospital encounter of 08/28/14 (from the past 48 hour(s))  Basic metabolic panel     Status: Abnormal   Collection Time: 09/03/14 10:39 AM  Result Value Ref Range   Sodium 130 (L) 135 - 145 mmol/L   Potassium 5.1 3.5 - 5.1 mmol/L   Chloride 95 (L) 101 - 111 mmol/L   CO2 25 22 - 32 mmol/L   Glucose, Bld 139 (H) 65 - 99 mg/dL   BUN 34 (H) 6 - 20 mg/dL   Creatinine, Ser 1.32 (H) 0.44 - 1.00 mg/dL   Calcium 9.2 8.9 - 10.3 mg/dL   GFR calc non Af Amer 38 (L) >60 mL/min   GFR calc Af Amer 45 (L) >60 mL/min    Comment: (NOTE) The eGFR has been calculated using the CKD EPI equation. This calculation has not been validated in all clinical situations. eGFR's persistently <60 mL/min signify possible Chronic Kidney Disease.    Anion gap 10 5 - 15  Basic metabolic panel     Status: Abnormal   Collection Time: 09/04/14  6:25 AM  Result Value Ref Range   Sodium 130 (L) 135 - 145 mmol/L   Potassium 5.9 (H) 3.5 - 5.1 mmol/L   Chloride 95 (L) 101 -  111 mmol/L   CO2 24 22 - 32 mmol/L   Glucose, Bld 84 65 - 99 mg/dL   BUN 35 (H) 6 - 20 mg/dL   Creatinine, Ser 1.46 (H) 0.44 - 1.00 mg/dL   Calcium 9.3 8.9 - 10.3 mg/dL   GFR calc non Af Amer 34 (L) >60 mL/min   GFR calc Af Amer 39 (L) >60 mL/min    Comment: (NOTE) The eGFR has been calculated using the CKD EPI equation. This calculation has not been validated in all clinical situations. eGFR's persistently <60 mL/min signify possible Chronic Kidney Disease.    Anion gap 11 5 - 15  Basic metabolic panel     Status: Abnormal   Collection Time: 09/04/14  3:16 PM  Result Value Ref Range   Sodium 133 (L) 135 - 145 mmol/L   Potassium 4.9 3.5 - 5.1 mmol/L    Comment: DELTA CHECK NOTED   Chloride 97 (L) 101 - 111 mmol/L   CO2 23 22 - 32 mmol/L   Glucose, Bld 126 (H) 65 - 99 mg/dL   BUN 40 (H) 6 - 20 mg/dL   Creatinine, Ser 1.94 (H) 0.44 - 1.00 mg/dL   Calcium 9.3 8.9 - 10.3 mg/dL   GFR calc non Af Amer 24 (L) >60 mL/min  GFR calc Af Amer 28 (L) >60 mL/min    Comment: (NOTE) The eGFR has been calculated using the CKD EPI equation. This calculation has not been validated in all clinical situations. eGFR's persistently <60 mL/min signify possible Chronic Kidney Disease.    Anion gap 13 5 - 15  Basic metabolic panel     Status: Abnormal   Collection Time: 09/05/14  6:16 AM  Result Value Ref Range   Sodium 132 (L) 135 - 145 mmol/L   Potassium 4.5 3.5 - 5.1 mmol/L   Chloride 95 (L) 101 - 111 mmol/L   CO2 23 22 - 32 mmol/L   Glucose, Bld 84 65 - 99 mg/dL   BUN 39 (H) 6 - 20 mg/dL   Creatinine, Ser 1.49 (H) 0.44 - 1.00 mg/dL   Calcium 9.2 8.9 - 10.3 mg/dL   GFR calc non Af Amer 33 (L) >60 mL/min   GFR calc Af Amer 38 (L) >60 mL/min    Comment: (NOTE) The eGFR has been calculated using the CKD EPI equation. This calculation has not been validated in all clinical situations. eGFR's persistently <60 mL/min signify possible Chronic Kidney Disease.    Anion gap 14 5 - 15     ABGS No results for input(s): PHART, PO2ART, TCO2, HCO3 in the last 72 hours.  Invalid input(s): PCO2 CULTURES No results found for this or any previous visit (from the past 240 hour(s)). Studies/Results: No results found.  Medications:  Prior to Admission:  Prescriptions prior to admission  Medication Sig Dispense Refill Last Dose  . acetaminophen (TYLENOL) 500 MG tablet Take 1,000 mg by mouth every 6 (six) hours as needed.    08/28/2014  . aspirin 325 MG EC tablet Take 325 mg by mouth daily.   08/28/2014  . carvedilol (COREG) 6.25 MG tablet TAKE 1 TABLET (6.25 MG TOTAL) BY MOUTH 2 (TWO) TIMES DAILY WITH A MEAL. 60 tablet 5 08/28/2014 at 12:00  . furosemide (LASIX) 40 MG tablet TAKE 1 TABLET BY MOUTH TWICE A DAY 60 tablet 6 08/28/2014  . hydroxychloroquine (PLAQUENIL) 200 MG tablet Take 100-200 mg by mouth 2 (two) times daily. Takes a whole tablet in the morning and half a tablet in the evening .   08/28/2014  . lisinopril (PRINIVIL,ZESTRIL) 40 MG tablet TAKE 1 TABLET BY MOUTH EVERY DAY 30 tablet 6 08/28/2014  . lovastatin (MEVACOR) 40 MG tablet Take 1 tablet (40 mg total) by mouth at bedtime. 30 tablet 1 08/27/2014  . omeprazole (PRILOSEC) 20 MG capsule Take 20 mg by mouth daily. For gas/heartburn   08/28/2014  . PROAIR HFA 108 (90 BASE) MCG/ACT inhaler Inhale 2 puffs into the lungs every 4 (four) hours as needed for wheezing or shortness of breath.    08/27/2014  . ibuprofen (ADVIL,MOTRIN) 200 MG tablet Take 1 tablet (200 mg total) by mouth every 6 (six) hours as needed for fever, headache, mild pain or moderate pain. (Patient not taking: Reported on 08/28/2014)   Taking  . nitroGLYCERIN (NITROSTAT) 0.4 MG SL tablet Place 1 tablet (0.4 mg total) under the tongue every 5 (five) minutes as needed for chest pain. (Patient not taking: Reported on 06/03/2014) 25 tablet 4 Not Taking  . potassium chloride (K-DUR) 10 MEQ tablet Take 1 tablet (10 mEq total) by mouth 2 (two) times daily. (Patient not taking:  Reported on 06/03/2014) 60 tablet 0 Not Taking  . predniSONE (DELTASONE) 5 MG tablet 5 mg daily.    Not Taking   Scheduled: .  aspirin  81 mg Oral Daily  . carvedilol  6.25 mg Oral BID WC  . heparin  5,000 Units Subcutaneous 3 times per day  . hydroxychloroquine  100 mg Oral QPC supper  . hydroxychloroquine  200 mg Oral Daily  . levofloxacin  250 mg Oral Daily  . pantoprazole  40 mg Oral Daily  . pravastatin  40 mg Oral q1800  . sodium chloride  3 mL Intravenous Q12H   Continuous:  NOT:[id number], albuterol, ondansetron **OR** ondansetron (ZOFRAN) IV  Assesment: She was admitted with acute on chronic combined systolic and diastolic CHF. Yesterday her renal function was significantly worse and previously and her potassium was up to 5.9. This morning her creatinine is down to 1.49 with potassium of 4.5 Principal Problem:   Acute on chronic combined systolic and diastolic congestive heart failure Active Problems:   Chronic systolic heart failure   Anemia of chronic disease   Hypertension   Biventricular ICD in place (MDT 2014)   CHF exacerbation   CAD S/P remote PCI- no details   Cardiomyopathy, ischemic-EF 30-35% March 3832   Diastolic dysfunction-grade 2    Plan: Restart diuretic at lower dose recheck laboratory work tomorrow.    LOS: 8 days   Terisa Belardo L 09/05/2014, 8:46 AM

## 2014-09-06 LAB — BASIC METABOLIC PANEL
ANION GAP: 10 (ref 5–15)
BUN: 34 mg/dL — ABNORMAL HIGH (ref 6–20)
CO2: 26 mmol/L (ref 22–32)
CREATININE: 1.18 mg/dL — AB (ref 0.44–1.00)
Calcium: 9.1 mg/dL (ref 8.9–10.3)
Chloride: 97 mmol/L — ABNORMAL LOW (ref 101–111)
GFR calc Af Amer: 51 mL/min — ABNORMAL LOW (ref >60)
GFR calc non Af Amer: 44 mL/min — ABNORMAL LOW (ref >60)
Glucose, Bld: 94 mg/dL (ref 65–99)
Potassium: 4.6 mmol/L (ref 3.5–5.1)
Sodium: 133 mmol/L — ABNORMAL LOW (ref 135–145)

## 2014-09-06 MED ORDER — LEVOFLOXACIN 250 MG PO TABS
250.0000 mg | ORAL_TABLET | Freq: Every day | ORAL | Status: DC
Start: 2014-09-06 — End: 2014-09-17

## 2014-09-06 NOTE — Progress Notes (Signed)
1215 D/C instructions and paperwork given to patient and patient's brother. Patient's home medications obtained from pharmacy and returned to patient. Tele box and wires removed from patient and central tele notified and made aware. IV catheter removed from RIGHT AC, catheter tip intact, no s/s of infection noted. Patient confirmed having her dentures (which were noted in her mouth at the time of d/c) and other belongings to return home with her. Patient assisted to vehicle via w/c by staff.

## 2014-09-06 NOTE — Care Management Note (Signed)
Case Management Note  Patient Details  Name: SHANAYAH AUSBURN MRN: [id number] Date of Birth: [birth year]  Subjective/Objective:                    Action/Plan:   Expected Discharge Date:  08/30/14               Expected Discharge Plan:  Home/Self Care  In-House Referral:  NA  Discharge planning Services  CM Consult  Post Acute Care Choice:  Home Health Choice offered to:  Patient  DME Arranged:    DME Agency:     HH Arranged:  RN, Disease Management [place] Agency:  [place]  Status of Service:  Completed, signed off  Medicare Important Message Given:  Yes Date Medicare IM Given:  09/03/14 Medicare IM give by:  Christinia Gully, RN BSN CM Date Additional Medicare IM Given:  09/06/14 Additional Medicare Important Message give by:  Christinia Gully, RN BSN CM  If discussed at [employer], dates discussed:    Additional Comments: Pt discharged home today with [facility] RN (per pts choice). Romualdo Bolk of [facility] is aware and will collect the pts information from the chart. [place] services to start within 48 hours of discharge. No DME needs noted. Pt and pts nurse aware of discharge arrangements. Christinia Gully [place], RN 09/06/2014, 9:26 AM

## 2014-09-06 NOTE — Progress Notes (Signed)
She is being discharged today which is appropriate. She is going to go home on oral Levaquin which I think is a good choice. She will need follow-up chest x-ray in 4-6 weeks to document complete resolution of the infiltrate

## 2014-09-06 NOTE — Discharge Summary (Signed)
Physician Discharge Summary  Patient ID: [name] MRN: [id number] DOB/AGE: 08/24/1940 74 y.o. Primary Care Physician:Nikeisha Klutz, MD Admit date: 08/28/2014 Discharge date: 09/06/2014    Discharge Diagnoses:   Principal Problem:   Acute on chronic combined systolic and diastolic congestive heart failure Active Problems:   Chronic systolic heart failure   Anemia of chronic disease   Hypertension   Biventricular ICD in place (MDT 2014)   CHF exacerbation   CAD S/P remote PCI- no details   Cardiomyopathy, ischemic-EF 30-35% March [id number]   Diastolic dysfunction-grade 2 LLL pneumonia     Medication List    STOP taking these medications        ibuprofen 200 MG tablet  Commonly known as:  ADVIL,MOTRIN      TAKE these medications        acetaminophen 500 MG tablet  Commonly known as:  TYLENOL  Take 1,000 mg by mouth every 6 (six) hours as needed.     aspirin 325 MG EC tablet  Take 325 mg by mouth daily.     carvedilol 6.25 MG tablet  Commonly known as:  COREG  TAKE 1 TABLET (6.25 MG TOTAL) BY MOUTH 2 (TWO) TIMES DAILY WITH A MEAL.     furosemide 40 MG tablet  Commonly known as:  LASIX  TAKE 1 TABLET BY MOUTH TWICE A DAY     hydroxychloroquine 200 MG tablet  Commonly known as:  PLAQUENIL  Take 100-200 mg by mouth 2 (two) times daily. Takes a whole tablet in the morning and half a tablet in the evening .     levofloxacin 250 MG tablet  Commonly known as:  LEVAQUIN  Take 1 tablet (250 mg total) by mouth daily.     lisinopril 40 MG tablet  Commonly known as:  PRINIVIL,ZESTRIL  TAKE 1 TABLET BY MOUTH EVERY DAY     lovastatin 40 MG tablet  Commonly known as:  MEVACOR  Take 1 tablet (40 mg total) by mouth at bedtime.     nitroGLYCERIN 0.4 MG SL tablet  Commonly known as:  NITROSTAT  Place 1 tablet (0.4 mg total) under the tongue every 5 (five) minutes as needed for chest pain.     omeprazole 20 MG capsule  Commonly known as:  PRILOSEC  Take 20 mg by mouth  daily. For gas/heartburn     potassium chloride 10 MEQ tablet  Commonly known as:  K-DUR  Take 1 tablet (10 mEq total) by mouth 2 (two) times daily.     predniSONE 5 MG tablet  Commonly known as:  DELTASONE  5 mg daily.     PROAIR HFA 108 (90 BASE) MCG/ACT inhaler  Generic drug:  albuterol  Inhale 2 puffs into the lungs every 4 (four) hours as needed for wheezing or shortness of breath.        Discharged Condition: improved    Consults: cardiology and pulmonary  Significant Diagnostic Studies: Dg Chest 2 View  09/02/2014   CLINICAL DATA:  Congestive heart failure  EXAM: CHEST  2 VIEW  COMPARISON:  08/28/2014 and multiple prior studies  FINDINGS: Moderate cardiac enlargement. Cardiac pacer/ defibrillator with generator over left thorax. Calcification of the aortic arch. Vascular pattern normal. No evidence of pulmonary edema currently. Mild infiltrate left lower lobe likely atelectasis, although slightly more pronounced when compared to the prior study. There is stable mild bilateral chronic bronchitic change.  IMPRESSION: Stable cardiac enlargement with no evidence of pulmonary edema currently.  Left lower lobe infiltrate  could represent pneumonitis or pneumonia.   Electronically Signed   By: Skipper Cliche M.D.   On: 09/02/2014 11:06   Dg Chest 2 View  08/28/2014   CLINICAL DATA:  Cough, multiple episodes of pneumonia, history hypertension, breast cancer  EXAM: CHEST  2 VIEW  COMPARISON:  01/11/2014  FINDINGS: LEFT subclavian AICD leads project at RIGHT atrium, RIGHT ventricle and coronary sinus.  Enlargement of cardiac silhouette with pulmonary vascular congestion.  Atherosclerotic calcification aorta.  Minimal prominence of interstitial markings versus previous exam, question related to differences in technique though cannot completely exclude minimal diffuse edema.  No pleural effusion or pneumothorax.  Post LEFT mastectomy and axillary node dissection.  Bones demineralized.   IMPRESSION: Enlargement of cardiac silhouette with pulmonary vascular congestion post AICD.  Mild prominence of interstitial markings versus previous exam could be related to differences in technique but unable to exclude minimal pulmonary edema.   Electronically Signed   By: Lavonia Dana M.D.   On: 08/28/2014 15:56    Lab Results: Basic Metabolic Panel:  Recent Labs  09/05/14 0616 09/06/14 0609  NA 132* 133*  K 4.5 4.6  CL 95* 97*  CO2 23 26  GLUCOSE 84 94  BUN 39* 34*  CREATININE 1.49* 1.18*  CALCIUM 9.2 9.1   Liver Function Tests: No results for input(s): AST, ALT, ALKPHOS, BILITOT, PROT, ALBUMIN in the last 72 hours.   CBC: No results for input(s): WBC, NEUTROABS, HGB, HCT, MCV, PLT in the last 72 hours.  No results found for this or any previous visit (from the past 240 hour(s)).   Hospital Course:   This is a 74 years old female with history of multiple medical illnesses was admitted due decompensated CHF with pulmonary edema. Patient was evaluated by cardiology and she was started on IV diuretics. She was aggressively diuresed and improved. Repeat chest x-ray showed LLL infiltrate. Pulmonary consult was done and patient was started on oral antibiotics. Over all patient improved and she is being discharged on oral antibiotics and diuretics.   Discharge Exam: Blood pressure 133/56, pulse 77, temperature 98.6 F (37 C), temperature source Oral, resp. rate 18, height 5\' 4"  (1.626 m), weight 59.6 kg (131 lb 6.3 oz), SpO2 98 %.    Disposition:  home        Follow-up Information    Follow up with Jory Sims, NP On 09/17/2014.   Specialties:  Nurse Practitioner, Radiology, Cardiology   Why:  1:10 pm   Contact information:   [address] [place] [address] [phone]       Follow up with [facility], MD In 1 week.   Specialty:  Internal Medicine   Contact information:   [place] [address] [phone]        Signed: [name]   09/06/2014, 8:02 AM

## 2014-09-08 DIAGNOSIS — I5043 Acute on chronic combined systolic (congestive) and diastolic (congestive) heart failure: Secondary | ICD-10-CM | POA: Diagnosis not present

## 2014-09-08 DIAGNOSIS — I10 Essential (primary) hypertension: Secondary | ICD-10-CM | POA: Diagnosis not present

## 2014-09-08 DIAGNOSIS — D638 Anemia in other chronic diseases classified elsewhere: Secondary | ICD-10-CM | POA: Diagnosis not present

## 2014-09-08 DIAGNOSIS — I2510 Atherosclerotic heart disease of native coronary artery without angina pectoris: Secondary | ICD-10-CM | POA: Diagnosis not present

## 2014-09-10 ENCOUNTER — Encounter: Payer: Self-pay | Admitting: Internal Medicine

## 2014-09-10 ENCOUNTER — Ambulatory Visit (INDEPENDENT_AMBULATORY_CARE_PROVIDER_SITE_OTHER): Payer: Medicare Other | Admitting: Internal Medicine

## 2014-09-10 VITALS — BP 118/60 | HR 83 | Ht 64.0 in | Wt 133.0 lb

## 2014-09-10 DIAGNOSIS — I5043 Acute on chronic combined systolic (congestive) and diastolic (congestive) heart failure: Secondary | ICD-10-CM

## 2014-09-10 DIAGNOSIS — I255 Ischemic cardiomyopathy: Secondary | ICD-10-CM | POA: Diagnosis not present

## 2014-09-10 DIAGNOSIS — I5022 Chronic systolic (congestive) heart failure: Secondary | ICD-10-CM | POA: Diagnosis not present

## 2014-09-10 DIAGNOSIS — I10 Essential (primary) hypertension: Secondary | ICD-10-CM | POA: Diagnosis not present

## 2014-09-10 DIAGNOSIS — Z95810 Presence of automatic (implantable) cardiac defibrillator: Secondary | ICD-10-CM

## 2014-09-10 LAB — CUP PACEART INCLINIC DEVICE CHECK
Battery Remaining Longevity: 90 mo
Battery Voltage: 2.99 V
Brady Statistic AP VP Percent: 18.44 %
Brady Statistic RA Percent Paced: 19.55 %
Brady Statistic RV Percent Paced: 4.03 %
HighPow Impedance: 209 Ohm
HighPow Impedance: 87 Ohm
Lead Channel Impedance Value: 399 Ohm
Lead Channel Impedance Value: 456 Ohm
Lead Channel Impedance Value: 836 Ohm
Lead Channel Pacing Threshold Amplitude: 0.5 V
Lead Channel Pacing Threshold Amplitude: 1 V
Lead Channel Pacing Threshold Pulse Width: 0.4 ms
Lead Channel Sensing Intrinsic Amplitude: 2.875 mV
Lead Channel Sensing Intrinsic Amplitude: 31.625 mV
Lead Channel Setting Pacing Amplitude: 1.5 V
Lead Channel Setting Pacing Pulse Width: 0.5 ms
Lead Channel Setting Sensing Sensitivity: 0.3 mV
MDC IDC MSMT LEADCHNL LV IMPEDANCE VALUE: 380 Ohm
MDC IDC MSMT LEADCHNL LV IMPEDANCE VALUE: 589 Ohm
MDC IDC MSMT LEADCHNL LV PACING THRESHOLD PULSEWIDTH: 0.5 ms
MDC IDC MSMT LEADCHNL RA PACING THRESHOLD AMPLITUDE: 0.5 V
MDC IDC MSMT LEADCHNL RA PACING THRESHOLD PULSEWIDTH: 0.4 ms
MDC IDC SESS DTM: 20160520094731
MDC IDC SET LEADCHNL RA PACING AMPLITUDE: 2 V
MDC IDC SET LEADCHNL RV PACING AMPLITUDE: 2.5 V
MDC IDC SET LEADCHNL RV PACING PULSEWIDTH: 0.4 ms
MDC IDC STAT BRADY AP VS PERCENT: 1.11 %
MDC IDC STAT BRADY AS VP PERCENT: 78.27 %
MDC IDC STAT BRADY AS VS PERCENT: 2.18 %
Zone Setting Detection Interval: 270 ms
Zone Setting Detection Interval: 350 ms
Zone Setting Detection Interval: 360 ms
Zone Setting Detection Interval: 360 ms

## 2014-09-10 NOTE — Assessment & Plan Note (Signed)
Her chronic systolic heart failure is well compensated. She will continue her current meds. Her fluid index is normal.

## 2014-09-10 NOTE — Assessment & Plan Note (Signed)
Her blood pressure is well controlled. No change in meds. 

## 2014-09-10 NOTE — Assessment & Plan Note (Signed)
Her medtronic biv ICD is working normally. Will recheck in several months.

## 2014-09-10 NOTE — Patient Instructions (Signed)
Your physician wants you to follow-up in: 1 year with Dr. Taylor. You will receive a reminder letter in the mail two months in advance. If you don't receive a letter, please call our office to schedule the follow-up appointment.  Your physician recommends that you continue on your current medications as directed. Please refer to the Current Medication list given to you today.  Thank you for choosing [facility]!   

## 2014-09-10 NOTE — Progress Notes (Signed)
HPI [name] returns today for followup. She is a very pleasant 74 year old woman with a history of hypertension, chronic systolic heart failure, and left bundle branch block, status post biventricular ICD implantation. She denies any recent ICD shocks. Her heart failure symptoms are improved but she still admits to sodium indiscretion. She denies chest pain, shortness of breath, or syncope. She has not had much in the way of peripheral edema.  No Known Allergies   Current Outpatient Prescriptions  Medication Sig Dispense Refill  . acetaminophen (TYLENOL) 500 MG tablet Take 1,000 mg by mouth every 6 (six) hours as needed.     [name] aspirin 325 MG EC tablet Take 325 mg by mouth daily.    . carvedilol (COREG) 6.25 MG tablet TAKE 1 TABLET (6.25 MG TOTAL) BY MOUTH 2 (TWO) TIMES DAILY WITH A MEAL. 60 tablet 5  . furosemide (LASIX) 40 MG tablet TAKE 1 TABLET BY MOUTH TWICE A DAY 60 tablet 6  . hydroxychloroquine (PLAQUENIL) 200 MG tablet Take 100-200 mg by mouth 2 (two) times daily. Takes a whole tablet in the morning and half a tablet in the evening .    . levofloxacin (LEVAQUIN) 250 MG tablet Take 1 tablet (250 mg total) by mouth daily. 7 tablet 0  . lisinopril (PRINIVIL,ZESTRIL) 40 MG tablet TAKE 1 TABLET BY MOUTH EVERY DAY 30 tablet 6  . lovastatin (MEVACOR) 40 MG tablet Take 1 tablet (40 mg total) by mouth at bedtime. 30 tablet 1  . nitroGLYCERIN (NITROSTAT) 0.4 MG SL tablet Place 1 tablet (0.4 mg total) under the tongue every 5 (five) minutes as needed for chest pain. 25 tablet 4  . potassium chloride (K-DUR) 10 MEQ tablet Take 1 tablet (10 mEq total) by mouth 2 (two) times daily. 60 tablet 0  . predniSONE (DELTASONE) 5 MG tablet 5 mg daily.     [name] PROAIR HFA 108 (90 BASE) MCG/ACT inhaler Inhale 2 puffs into the lungs every 4 (four) hours as needed for wheezing or shortness of breath.     [name] omeprazole (PRILOSEC) 20 MG capsule Take 20 mg by mouth daily. For gas/heartburn     No current  facility-administered medications for this visit.     Past Medical History  Diagnosis Date  . Arteriosclerotic cardiovascular disease (ASCVD)     Remote PTCA by patient report; LBBB; associated cardiomyopathy, presumed ischemic with EF 40-45% previously, 20% in 06/2009; h/o clinical congestive heart failure; negative stress nuclear in 2009 with inferoseptal and apical scar  . Anemia     Hgb of 9-10  . Hyperlipidemia   . Breast cancer   . Hypertension   . Automatic implantable cardioverter-defibrillator in situ   . HOH (hard of hearing)   . Anemia of chronic disease     Hgb of 9-10 chronically; 06/2010: H&H-10.7/33.5, MCV-81, normal iron studies in 2010     ROS:   All systems reviewed and negative except as noted in the HPI.   Past Surgical History  Procedure Laterality Date  . Total knee arthroplasty Right     Dr.Harrison  . Bi-ventricular implantable cardioverter defibrillator  (crt-d)  07/28/2012  . Cataract extraction w/ intraocular lens implant Left   . Tubal ligation    . Breast biopsy Bilateral   . Mastectomy Left 1998  . Cataract extraction w/phaco Right 09/15/2013    Procedure: CATARACT EXTRACTION PHACO AND INTRAOCULAR LENS PLACEMENT (IOC);  Surgeon: Elta Guadeloupe T. Gershon Crane, MD;  Location: [facility];  Service: Ophthalmology;  Laterality: Right;  CDE:  10.74  . Bi-ventricular implantable cardioverter defibrillator N/A 07/28/2012    Procedure: BI-VENTRICULAR IMPLANTABLE CARDIOVERTER DEFIBRILLATOR  (CRT-D);  Surgeon: Evans Lance, MD;  Location: [facility] CATH LAB;  Service: Cardiovascular;  Laterality: N/A;     Family History  Problem Relation Age of Onset  . Diabetes Father   . Cancer Father   . Hypertension Brother      History   Social History  . Marital Status: Married    Spouse Name: N/A  . Number of Children: N/A  . Years of Education: N/A   Occupational History  . Retired    Social History Main Topics  . Smoking status: Former Smoker    Types: Cigarettes  .  Smokeless tobacco: Current User    Types: Chew     Comment: 07/03/2013 "smoked some; don't know how much or how long or when I quit"  . Alcohol Use: No  . Drug Use: No  . Sexual Activity: Not Currently   Other Topics Concern  . Not on file   Social History Narrative     BP 118/60 mmHg  Pulse 83  Ht 5\' 4"  (1.626 m)  Wt 133 lb (60.328 kg)  BMI 22.82 kg/m2  SpO2 97%  Physical Exam:  Stable appearing  74 year old woman,NAD HEENT: Unremarkable Neck:  8 cm JVD, no thyromegally Back:  No CVA tenderness Lungs:  Clear with no wheezes, rales, or rhonchi. HEART:  Regular rate rhythm, 2/6 systolic murmur c/w MR, no rubs, no clicks Abd:  soft, positive bowel sounds, no organomegally, no rebound, no guarding Ext:  2 plus pulses, trace edema left leg, no cyanosis, no clubbing Skin:  No rashes no nodules Neuro:  CN II through XII intact, motor grossly intact   DEVICE  Normal device function.  See PaceArt for details.   Assess/Plan:

## 2014-09-13 ENCOUNTER — Encounter: Payer: Medicare Other | Admitting: *Deleted

## 2014-09-13 ENCOUNTER — Telehealth: Payer: Self-pay | Admitting: Cardiology

## 2014-09-13 DIAGNOSIS — M069 Rheumatoid arthritis, unspecified: Secondary | ICD-10-CM | POA: Diagnosis not present

## 2014-09-13 DIAGNOSIS — J189 Pneumonia, unspecified organism: Secondary | ICD-10-CM | POA: Diagnosis not present

## 2014-09-13 DIAGNOSIS — I5022 Chronic systolic (congestive) heart failure: Secondary | ICD-10-CM | POA: Diagnosis not present

## 2014-09-13 DIAGNOSIS — I10 Essential (primary) hypertension: Secondary | ICD-10-CM | POA: Diagnosis not present

## 2014-09-13 NOTE — Telephone Encounter (Signed)
Spoke with pt and reminded pt of remote transmission that is due today. Pt verbalized understanding.   

## 2014-09-14 ENCOUNTER — Encounter: Payer: Self-pay | Admitting: *Deleted

## 2014-09-14 DIAGNOSIS — I2510 Atherosclerotic heart disease of native coronary artery without angina pectoris: Secondary | ICD-10-CM | POA: Diagnosis not present

## 2014-09-14 DIAGNOSIS — D638 Anemia in other chronic diseases classified elsewhere: Secondary | ICD-10-CM | POA: Diagnosis not present

## 2014-09-14 DIAGNOSIS — I10 Essential (primary) hypertension: Secondary | ICD-10-CM | POA: Diagnosis not present

## 2014-09-14 DIAGNOSIS — I5043 Acute on chronic combined systolic (congestive) and diastolic (congestive) heart failure: Secondary | ICD-10-CM | POA: Diagnosis not present

## 2014-09-16 DIAGNOSIS — D638 Anemia in other chronic diseases classified elsewhere: Secondary | ICD-10-CM | POA: Diagnosis not present

## 2014-09-16 DIAGNOSIS — I5043 Acute on chronic combined systolic (congestive) and diastolic (congestive) heart failure: Secondary | ICD-10-CM | POA: Diagnosis not present

## 2014-09-16 DIAGNOSIS — I2510 Atherosclerotic heart disease of native coronary artery without angina pectoris: Secondary | ICD-10-CM | POA: Diagnosis not present

## 2014-09-16 DIAGNOSIS — I10 Essential (primary) hypertension: Secondary | ICD-10-CM | POA: Diagnosis not present

## 2014-09-17 ENCOUNTER — Ambulatory Visit (INDEPENDENT_AMBULATORY_CARE_PROVIDER_SITE_OTHER): Payer: Medicare Other | Admitting: Adult Health

## 2014-09-17 ENCOUNTER — Encounter: Payer: Self-pay | Admitting: Adult Health

## 2014-09-17 VITALS — BP 110/61 | HR 68 | Ht 64.0 in | Wt 132.0 lb

## 2014-09-17 DIAGNOSIS — I5022 Chronic systolic (congestive) heart failure: Secondary | ICD-10-CM | POA: Diagnosis not present

## 2014-09-17 DIAGNOSIS — I10 Essential (primary) hypertension: Secondary | ICD-10-CM | POA: Diagnosis not present

## 2014-09-17 MED ORDER — POTASSIUM CHLORIDE ER 10 MEQ PO TBCR: 10.0000 meq | EXTENDED_RELEASE_TABLET | Freq: Two times a day (BID) | ORAL | Status: DC

## 2014-09-17 MED ORDER — LISINOPRIL 20 MG PO TABS
20.0000 mg | ORAL_TABLET | Freq: Every day | ORAL | Status: DC
Start: 2014-09-17 — End: 2015-09-26

## 2014-09-17 NOTE — Patient Instructions (Addendum)
Your physician has recommended you make the following change in your medication:  Decrease your lisinopril to 20 mg daily. You may break your 40 mg in half daily until they are finished. Please pick up your refill on your potassium tablets that were sent to your pharmacy today. Your physician recommends that you have lab work today to check your BMET and Magnesium level. Your physician recommends that you schedule a follow-up appointment in: 1 month with Jory Sims.

## 2014-09-17 NOTE — Progress Notes (Deleted)
Name: [name]    DOB: 1941/02/26  Age: 74 y.o.  MR#: [id number]       PCP:  [name], MD      Insurance: Payor: [employer] MEDICARE / Plan: [facility] MEDICARE / Product Type: *No Product type* /   CC:    Chief Complaint  Patient presents with  . Hypertension  . Congestive Heart Failure    Systolic     VS Filed Vitals:   09/17/14 1306  BP: 86/56  Pulse: 66  Height: 5\' 4"  (1.626 m)  Weight: 132 lb (59.875 kg)    Weights Current Weight  09/17/14 132 lb (59.875 kg)  09/10/14 133 lb (60.328 kg)  09/06/14 131 lb 6.3 oz (59.6 kg)    Blood Pressure  BP Readings from Last 3 Encounters:  09/17/14 86/56  09/10/14 118/60  09/06/14 133/56     Admit date:  (Not on file) Last encounter with RMR:  Visit date not found   Allergy Review of patient's allergies indicates no known allergies.  Current Outpatient Prescriptions  Medication Sig Dispense Refill  . acetaminophen (TYLENOL) 500 MG tablet Take 1,000 mg by mouth every 6 (six) hours as needed.     [name] aspirin 325 MG EC tablet Take 325 mg by mouth daily.    . carvedilol (COREG) 6.25 MG tablet TAKE 1 TABLET (6.25 MG TOTAL) BY MOUTH 2 (TWO) TIMES DAILY WITH A MEAL. 60 tablet 5  . furosemide (LASIX) 40 MG tablet TAKE 1 TABLET BY MOUTH TWICE A DAY 60 tablet 6  . hydroxychloroquine (PLAQUENIL) 200 MG tablet Take 100-200 mg by mouth 2 (two) times daily. Takes a whole tablet in the morning and half a tablet in the evening .    . lisinopril (PRINIVIL,ZESTRIL) 40 MG tablet TAKE 1 TABLET BY MOUTH EVERY DAY 30 tablet 6  . lovastatin (MEVACOR) 40 MG tablet Take 1 tablet (40 mg total) by mouth at bedtime. 30 tablet 1  . omeprazole (PRILOSEC) 20 MG capsule Take 20 mg by mouth daily. For gas/heartburn    . PROAIR HFA 108 (90 BASE) MCG/ACT inhaler Inhale 2 puffs into the lungs every 4 (four) hours as needed for wheezing or shortness of breath.     . nitroGLYCERIN (NITROSTAT) 0.4 MG SL tablet Place 1 tablet (0.4 mg total) under the tongue  every 5 (five) minutes as needed for chest pain. (Patient not taking: Reported on 09/17/2014) 25 tablet 4  . potassium chloride (K-DUR) 10 MEQ tablet Take 1 tablet (10 mEq total) by mouth 2 (two) times daily. (Patient not taking: Reported on 09/17/2014) 60 tablet 0   No current facility-administered medications for this visit.    Discontinued Meds:    Medications Discontinued During This Encounter  Medication Reason  . levofloxacin (LEVAQUIN) 250 MG tablet Completed Course  . predniSONE (DELTASONE) 5 MG tablet Completed Course    Patient Active Problem List   Diagnosis Date Noted  . CAD S/P remote PCI- no details 09/02/2014  . Cardiomyopathy, ischemic-EF 30-35% March 2015 09/02/2014  . Diastolic dysfunction-grade 2 09/02/2014  . CHF exacerbation 08/28/2014  . Acute on chronic combined systolic and diastolic congestive heart failure 08/28/2014  . Iron deficiency anemia 08/20/2013  . Malnutrition of moderate degree 07/21/2013  . PNA (pneumonia) 07/19/2013  . HCAP (healthcare-associated pneumonia) 07/17/2013  . Sepsis 07/17/2013  . ARF (acute renal failure) 07/17/2013  . Rheumatoid arthritis 07/04/2013  . Hypokalemia 07/03/2013  . Pain and swelling of wrist 07/03/2013  . Chest pain 07/03/2013  .  Biventricular ICD in place (MDT 2014) 11/06/2012  . Anemia of chronic disease   . Breast cancer   . Hypertension   . Dyslipidemia 05/24/2009  . Chronic systolic heart failure 99991111    LABS    Component Value Date/Time   NA 133* 09/06/2014 0609   NA 132* 09/05/2014 0616   NA 133* 09/04/2014 1516   K 4.6 09/06/2014 0609   K 4.5 09/05/2014 0616   K 4.9 09/04/2014 1516   CL 97* 09/06/2014 0609   CL 95* 09/05/2014 0616   CL 97* 09/04/2014 1516   CO2 26 09/06/2014 0609   CO2 23 09/05/2014 0616   CO2 23 09/04/2014 1516   GLUCOSE 94 09/06/2014 0609   GLUCOSE 84 09/05/2014 0616   GLUCOSE 126* 09/04/2014 1516   BUN 34* 09/06/2014 0609   BUN 39* 09/05/2014 0616   BUN 40*  09/04/2014 1516   CREATININE 1.18* 09/06/2014 0609   CREATININE 1.49* 09/05/2014 0616   CREATININE 1.94* 09/04/2014 1516   CREATININE 0.76 09/11/2013 1005   CREATININE 0.90 07/14/2012 1330   CREATININE 0.98 05/23/2012 0905   CALCIUM 9.1 09/06/2014 0609   CALCIUM 9.2 09/05/2014 0616   CALCIUM 9.3 09/04/2014 1516   GFRNONAA 44* 09/06/2014 0609   GFRNONAA 33* 09/05/2014 0616   GFRNONAA 24* 09/04/2014 1516   GFRAA 51* 09/06/2014 0609   GFRAA 38* 09/05/2014 0616   GFRAA 28* 09/04/2014 1516   CMP     Component Value Date/Time   NA 133* 09/06/2014 0609   K 4.6 09/06/2014 0609   CL 97* 09/06/2014 0609   CO2 26 09/06/2014 0609   GLUCOSE 94 09/06/2014 0609   BUN 34* 09/06/2014 0609   CREATININE 1.18* 09/06/2014 0609   CREATININE 0.76 09/11/2013 1005   CALCIUM 9.1 09/06/2014 0609   PROT 8.0 08/29/2014 0725   ALBUMIN 2.7* 08/29/2014 0725   AST 16 08/29/2014 0725   ALT 10* 08/29/2014 0725   ALKPHOS 89 08/29/2014 0725   BILITOT 0.5 08/29/2014 0725   GFRNONAA 44* 09/06/2014 0609   GFRAA 51* 09/06/2014 0609       Component Value Date/Time   WBC 8.6 08/29/2014 0725   WBC 8.0 08/28/2014 1913   WBC 8.0 08/28/2014 1608   HGB 9.3* 08/29/2014 0725   HGB 9.6* 08/28/2014 1913   HGB 9.4* 08/28/2014 1608   HCT 29.3* 08/29/2014 0725   HCT 30.9* 08/28/2014 1913   HCT 30.3* 08/28/2014 1608   MCV 77.3* 08/29/2014 0725   MCV 78.0 08/28/2014 1913   MCV 78.1 08/28/2014 1608    Lipid Panel     Component Value Date/Time   CHOL 134 05/23/2012 0905   TRIG 103 05/23/2012 0905   HDL 30* 05/23/2012 0905   CHOLHDL 4.5 05/23/2012 0905   VLDL 21 05/23/2012 0905   LDLCALC 83 05/23/2012 0905    ABG No results found for: PHART, PCO2ART, PO2ART, HCO3, TCO2, ACIDBASEDEF, O2SAT   Lab Results  Component Value Date   TSH 0.501 07/03/2013   BNP (last 3 results)  Recent Labs  08/28/14 1608  BNP 1300.0*    ProBNP (last 3 results) No results for input(s): PROBNP in the last 8760  hours.  Cardiac Panel (last 3 results) No results for input(s): CKTOTAL, CKMB, TROPONINI, RELINDX in the last 72 hours.  Iron/TIBC/Ferritin/ %Sat    Component Value Date/Time   IRON 31* 07/30/2014 1048   TIBC 184* 07/30/2014 1048   FERRITIN 159 07/30/2014 1048   IRONPCTSAT 17* 07/30/2014 1048  EKG Orders placed or performed during the hospital encounter of 08/28/14  . ED EKG  . ED EKG  . EKG 12-Lead  . EKG 12-Lead  . EKG     Prior Assessment and Plan Problem List as of 09/17/2014      Cardiovascular and Mediastinum   Chronic systolic heart failure   Last Assessment & Plan 09/10/2014 Office Visit Written 09/10/2014  9:49 AM by Evans Lance, MD    Her chronic systolic heart failure is well compensated. She will continue her current meds. Her fluid index is normal.       Hypertension   Last Assessment & Plan 09/10/2014 Office Visit Written 09/10/2014  9:49 AM by Evans Lance, MD    Her blood pressure is well controlled. No change in meds.       CAD S/P remote PCI- no details   Cardiomyopathy, ischemic-EF 30-35% March 2015   CHF exacerbation   Acute on chronic combined systolic and diastolic congestive heart failure     Respiratory   HCAP (healthcare-associated pneumonia)   PNA (pneumonia)     Musculoskeletal and Integument   Rheumatoid arthritis   Last Assessment & Plan 08/05/2014 Office Visit Written 08/05/2014 10:49 AM by Baird Cancer, PA-C    Followed by Dr. Miachel Roux and currently on Plaquenil        Genitourinary   ARF (acute renal failure)     Other   Dyslipidemia   Last Assessment & Plan 06/09/2012 Office Visit Edited 06/09/2012  5:53 PM by Yehuda Savannah, MD    Acceptable lipid profile last year, but as long she requires treatment with a statin, a higher dose will produce even a more favorable effect on her serum lipids and will be ordered.      Anemia of chronic disease   Last Assessment & Plan 08/05/2014 Office Visit Edited 08/05/2014 10:47 AM  by Baird Cancer, PA-C    Multifactorial with a definite element of anemia of chronic disease secondary to rheumatoid arthritis and its treatment, anemia of chronic renal disease, and iron deficiency anemia.    Work-up thus far demonstrates an anemia of chronic disease picture.  She is not symptomatic from an anemia standpoint and therefore ESA therapy is not indicated.  Labs in 3 and 6 months: CBC diff, iron/TIBC, ferritin, retic count  Return in 6 months for follow-up.      Biventricular ICD in place (MDT 2014)   Last Assessment & Plan 09/10/2014 Office Visit Written 09/10/2014  9:50 AM by Evans Lance, MD    Her medtronic biv ICD is working normally. Will recheck in several months.      Diastolic dysfunction-grade 2   Breast cancer   Hypokalemia   Pain and swelling of wrist   Chest pain   Sepsis   Malnutrition of moderate degree   Iron deficiency anemia   Last Assessment & Plan 06/03/2014 Office Visit Written 06/21/2014  6:17 PM by Molli Hazard, MD    74 year old female who presented with microcytic anemia and iron deficiency. In spite of iron replacement she remains quite anemic. She has evidence of only mild chronic kidney disease. Fecal occult blood was negative back in March 2015. There is no recent colonoscopy or EGD documented in Epic. I recommended a more extensive anemia evaluation to which she agrees. We will draw additional laboratory studies today and I will keep her apprised results when available. If all of her laboratory studies are normal we will  discuss additional valuation at her follow-up when she returns in 2 months.          Imaging: Dg Chest 2 View  09/02/2014   CLINICAL DATA:  Congestive heart failure  EXAM: CHEST  2 VIEW  COMPARISON:  08/28/2014 and multiple prior studies  FINDINGS: Moderate cardiac enlargement. Cardiac pacer/ defibrillator with generator over left thorax. Calcification of the aortic arch. Vascular pattern normal. No evidence of  pulmonary edema currently. Mild infiltrate left lower lobe likely atelectasis, although slightly more pronounced when compared to the prior study. There is stable mild bilateral chronic bronchitic change.  IMPRESSION: Stable cardiac enlargement with no evidence of pulmonary edema currently.  Left lower lobe infiltrate could represent pneumonitis or pneumonia.   Electronically Signed   By: Skipper Cliche M.D.   On: 09/02/2014 11:06   Dg Chest 2 View  08/28/2014   CLINICAL DATA:  Cough, multiple episodes of pneumonia, history hypertension, breast cancer  EXAM: CHEST  2 VIEW  COMPARISON:  01/11/2014  FINDINGS: LEFT subclavian AICD leads project at RIGHT atrium, RIGHT ventricle and coronary sinus.  Enlargement of cardiac silhouette with pulmonary vascular congestion.  Atherosclerotic calcification aorta.  Minimal prominence of interstitial markings versus previous exam, question related to differences in technique though cannot completely exclude minimal diffuse edema.  No pleural effusion or pneumothorax.  Post LEFT mastectomy and axillary node dissection.  Bones demineralized.  IMPRESSION: Enlargement of cardiac silhouette with pulmonary vascular congestion post AICD.  Mild prominence of interstitial markings versus previous exam could be related to differences in technique but unable to exclude minimal pulmonary edema.   Electronically Signed   By: Lavonia Dana M.D.   On: 08/28/2014 15:56

## 2014-09-17 NOTE — Progress Notes (Signed)
Cardiology Office Note   Date:  09/17/2014   ID:  Savannah, Holden [birth year], MRN [id number]  PCP:  [name], MD  Cardiologist: Bryna Colander, NP   Chief Complaint  Patient presents with  . Hypertension  . Congestive Heart Failure    Systolic       History of Present Illness: [name] is a 74 y.o. female who presents for ongoing assessment and management of chronic systolic CHF, CAD, ICD in situ (Medtronic), hypertension,wiht chronic LBBB. She was last seen by Dr. Lovena Le on 5./20/2016 and was stable. Unfortunately, she was admitted to the hospital for acute/chronic systolic CHF. She has run out of her lasix for a few days prior to admission.She was also started on antibiotics and diuresed. Wt on discharge 131 lbs.   She comes today hypotensive and dizzy. Feels tired.  Head spins when she gets up from sitting to standing. Some staggering.  No chest pain. No dyspnea.   Past Medical History  Diagnosis Date  . Arteriosclerotic cardiovascular disease (ASCVD)     Remote PTCA by patient report; LBBB; associated cardiomyopathy, presumed ischemic with EF 40-45% previously, 20% in 06/2009; h/o clinical congestive heart failure; negative stress nuclear in 2009 with inferoseptal and apical scar  . Anemia     Hgb of 9-10  . Hyperlipidemia   . Breast cancer   . Hypertension   . Automatic implantable cardioverter-defibrillator in situ   . HOH (hard of hearing)   . Anemia of chronic disease     Hgb of 9-10 chronically; 06/2010: H&H-10.7/33.5, MCV-81, normal iron studies in 2010     Past Surgical History  Procedure Laterality Date  . Total knee arthroplasty Right     Dr.Harrison  . Bi-ventricular implantable cardioverter defibrillator  (crt-d)  07/28/2012  . Cataract extraction w/ intraocular lens implant Left   . Tubal ligation    . Breast biopsy Bilateral   . Mastectomy Left 1998  . Cataract extraction w/phaco Right 09/15/2013    Procedure: CATARACT EXTRACTION PHACO  AND INTRAOCULAR LENS PLACEMENT (IOC);  Surgeon: Elta Guadeloupe T. Gershon Crane, MD;  Location: [facility];  Service: Ophthalmology;  Laterality: Right;  CDE:  10.74  . Bi-ventricular implantable cardioverter defibrillator N/A 07/28/2012    Procedure: BI-VENTRICULAR IMPLANTABLE CARDIOVERTER DEFIBRILLATOR  (CRT-D);  Surgeon: Evans Lance, MD;  Location: [facility] CATH LAB;  Service: Cardiovascular;  Laterality: N/A;     Current Outpatient Prescriptions  Medication Sig Dispense Refill  . acetaminophen (TYLENOL) 500 MG tablet Take 1,000 mg by mouth every 6 (six) hours as needed.     [name] aspirin 325 MG EC tablet Take 325 mg by mouth daily.    . carvedilol (COREG) 6.25 MG tablet TAKE 1 TABLET (6.25 MG TOTAL) BY MOUTH 2 (TWO) TIMES DAILY WITH A MEAL. 60 tablet 5  . furosemide (LASIX) 40 MG tablet TAKE 1 TABLET BY MOUTH TWICE A DAY 60 tablet 6  . hydroxychloroquine (PLAQUENIL) 200 MG tablet Take 100-200 mg by mouth 2 (two) times daily. Takes a whole tablet in the morning and half a tablet in the evening .    . lisinopril (PRINIVIL,ZESTRIL) 40 MG tablet TAKE 1 TABLET BY MOUTH EVERY DAY 30 tablet 6  . lovastatin (MEVACOR) 40 MG tablet Take 1 tablet (40 mg total) by mouth at bedtime. 30 tablet 1  . omeprazole (PRILOSEC) 20 MG capsule Take 20 mg by mouth daily. For gas/heartburn    . PROAIR HFA 108 (90 BASE) MCG/ACT inhaler Inhale 2 puffs into  the lungs every 4 (four) hours as needed for wheezing or shortness of breath.     . nitroGLYCERIN (NITROSTAT) 0.4 MG SL tablet Place 1 tablet (0.4 mg total) under the tongue every 5 (five) minutes as needed for chest pain. (Patient not taking: Reported on 09/17/2014) 25 tablet 4  . potassium chloride (K-DUR) 10 MEQ tablet Take 1 tablet (10 mEq total) by mouth 2 (two) times daily. (Patient not taking: Reported on 09/17/2014) 60 tablet 0   No current facility-administered medications for this visit.    Allergies:   Review of patient's allergies indicates no known allergies.    Social History:   The patient  reports that she quit smoking about 7 years ago. Her smoking use included Cigarettes. She started smoking about 58 years ago. She smoked 0.25 packs per day. Her smokeless tobacco use includes Chew. She reports that she does not drink alcohol or use illicit drugs.   Family History:  The patient's family history includes Cancer in her father; Diabetes in her father; Hypertension in her brother.    ROS: .   All other systems are reviewed and negative.Unless otherwise mentioned in  H&P above.   PHYSICAL EXAM: VS:  BP 86/56 mmHg  Pulse 66  Ht 5\' 4"  (1.626 m)  Wt 132 lb (59.875 kg)  BMI 22.65 kg/m2 , BMI Body mass index is 22.65 kg/(m^2). GEN: Well nourished, well developed, in no acute distress HEENT: normal Neck: no JVD, carotid bruits, or masses Cardiac: RRR; 2/6 systolic murmur, rubs, or gallops,no edema  Respiratory:  Clear to auscultation bilaterally, normal work of breathing GI: soft, nontender, nondistended, + BS MS: no deformity or atrophy Skin: warm and dry, no rash Neuro:  Strength and sensation are intact Psych: euthymic mood, full affect   Recent Labs: 08/28/2014: B Natriuretic Peptide 1300.0* 08/29/2014: ALT 10*; Hemoglobin 9.3*; Platelets 311 09/06/2014: BUN 34*; Creatinine 1.18*; Potassium 4.6; Sodium 133*    Lipid Panel    Component Value Date/Time   CHOL 134 05/23/2012 0905   TRIG 103 05/23/2012 0905   HDL 30* 05/23/2012 0905   CHOLHDL 4.5 05/23/2012 0905   VLDL 21 05/23/2012 0905   LDLCALC 83 05/23/2012 0905      Wt Readings from Last 3 Encounters:  09/17/14 132 lb (59.875 kg)  09/10/14 133 lb (60.328 kg)  09/06/14 131 lb 6.3 oz (59.6 kg)      Other studies Reviewed: Additional studies/ records that were reviewed today include: Discharge summary Review of the above records demonstrates: maximum medical therapy.   ASSESSMENT AND PLAN:  1. NICM: EF of 30-35%. She is hypotensive and symptomatic with this. Orthostatics are completed. She  remained hypotensive but negative for orthostatic BP, BP 97/51-117/63.   2. Hypotension: Will decrease lisinopril to 20 mg daily from 40 mg daily. Will check BMET and Mg level.    3. Systolic CHF: She does not have evidence of decompensation at this time.    Current medicines are reviewed at length with the patient today.    Labs/ tests ordered today include: BMET, Mg levels.  No orders of the defined types were placed in this encounter.     Disposition:   FU with one month.    Signed, Jory Sims, NP  09/17/2014 1:26 PM    [facility] [address], [place],  [address] Phone: [phone]; Fax: [phone]

## 2014-09-18 LAB — BASIC METABOLIC PANEL
BUN: 65 mg/dL — ABNORMAL HIGH (ref 6–23)
CALCIUM: 9.1 mg/dL (ref 8.4–10.5)
CO2: 23 mEq/L (ref 19–32)
CREATININE: 1.23 mg/dL — AB (ref 0.50–1.10)
Chloride: 102 mEq/L (ref 96–112)
Glucose, Bld: 98 mg/dL (ref 70–99)
POTASSIUM: 4.5 meq/L (ref 3.5–5.3)
SODIUM: 139 meq/L (ref 135–145)

## 2014-09-18 LAB — MAGNESIUM: MAGNESIUM: 2 mg/dL (ref 1.5–2.5)

## 2014-09-21 DIAGNOSIS — D638 Anemia in other chronic diseases classified elsewhere: Secondary | ICD-10-CM | POA: Diagnosis not present

## 2014-09-21 DIAGNOSIS — I5043 Acute on chronic combined systolic (congestive) and diastolic (congestive) heart failure: Secondary | ICD-10-CM | POA: Diagnosis not present

## 2014-09-21 DIAGNOSIS — I2510 Atherosclerotic heart disease of native coronary artery without angina pectoris: Secondary | ICD-10-CM | POA: Diagnosis not present

## 2014-09-21 DIAGNOSIS — I10 Essential (primary) hypertension: Secondary | ICD-10-CM | POA: Diagnosis not present

## 2014-09-23 DIAGNOSIS — D638 Anemia in other chronic diseases classified elsewhere: Secondary | ICD-10-CM | POA: Diagnosis not present

## 2014-09-23 DIAGNOSIS — I10 Essential (primary) hypertension: Secondary | ICD-10-CM | POA: Diagnosis not present

## 2014-09-23 DIAGNOSIS — I2510 Atherosclerotic heart disease of native coronary artery without angina pectoris: Secondary | ICD-10-CM | POA: Diagnosis not present

## 2014-09-23 DIAGNOSIS — I5043 Acute on chronic combined systolic (congestive) and diastolic (congestive) heart failure: Secondary | ICD-10-CM | POA: Diagnosis not present

## 2014-09-28 DIAGNOSIS — D638 Anemia in other chronic diseases classified elsewhere: Secondary | ICD-10-CM | POA: Diagnosis not present

## 2014-09-28 DIAGNOSIS — I10 Essential (primary) hypertension: Secondary | ICD-10-CM | POA: Diagnosis not present

## 2014-09-28 DIAGNOSIS — I5043 Acute on chronic combined systolic (congestive) and diastolic (congestive) heart failure: Secondary | ICD-10-CM | POA: Diagnosis not present

## 2014-09-28 DIAGNOSIS — I2510 Atherosclerotic heart disease of native coronary artery without angina pectoris: Secondary | ICD-10-CM | POA: Diagnosis not present

## 2014-09-30 DIAGNOSIS — I10 Essential (primary) hypertension: Secondary | ICD-10-CM | POA: Diagnosis not present

## 2014-09-30 DIAGNOSIS — I5022 Chronic systolic (congestive) heart failure: Secondary | ICD-10-CM | POA: Diagnosis not present

## 2014-09-30 DIAGNOSIS — M069 Rheumatoid arthritis, unspecified: Secondary | ICD-10-CM | POA: Diagnosis not present

## 2014-10-05 DIAGNOSIS — I10 Essential (primary) hypertension: Secondary | ICD-10-CM | POA: Diagnosis not present

## 2014-10-05 DIAGNOSIS — I2510 Atherosclerotic heart disease of native coronary artery without angina pectoris: Secondary | ICD-10-CM | POA: Diagnosis not present

## 2014-10-05 DIAGNOSIS — I5043 Acute on chronic combined systolic (congestive) and diastolic (congestive) heart failure: Secondary | ICD-10-CM | POA: Diagnosis not present

## 2014-10-05 DIAGNOSIS — D638 Anemia in other chronic diseases classified elsewhere: Secondary | ICD-10-CM | POA: Diagnosis not present

## 2014-10-14 ENCOUNTER — Ambulatory Visit (INDEPENDENT_AMBULATORY_CARE_PROVIDER_SITE_OTHER): Payer: Medicare Other | Admitting: *Deleted

## 2014-10-14 ENCOUNTER — Encounter: Payer: Self-pay | Admitting: *Deleted

## 2014-10-14 ENCOUNTER — Telehealth: Payer: Self-pay | Admitting: *Deleted

## 2014-10-14 DIAGNOSIS — Z95810 Presence of automatic (implantable) cardiac defibrillator: Secondary | ICD-10-CM

## 2014-10-14 DIAGNOSIS — I5022 Chronic systolic (congestive) heart failure: Secondary | ICD-10-CM

## 2014-10-14 NOTE — Telephone Encounter (Signed)
Follow up ° ° ° ° ° °Returning [name]'s call °

## 2014-10-14 NOTE — Telephone Encounter (Signed)
I spoke with [name].

## 2014-10-14 NOTE — Telephone Encounter (Signed)
ICM transmission received. I called and spoke the patient and she asked that I call [name] (nephew) to discuss due to her hearing. I attempted to call him- left message on his machine to call.

## 2014-10-14 NOTE — Progress Notes (Signed)
EPIC Encounter for ICM Monitoring  Patient Name: [name] is a 74 y.o. female Date: 10/14/2014 Primary Care Physican: [name], MD Primary Cardiologist: Woodroe Chen, [place] Electrophysiologist: Druscilla Brownie Weight: 137 lbs   Bi-V pacing: 94.4%      In the past month, have you:  1. Gained more than 2 pounds in a day or more than 5 pounds in a week? no  2. Had changes in your medications (with verification of current medications)? no  3. Had more shortness of breath than is usual for you? no  4. Limited your activity because of shortness of breath? no  5. Not been able to sleep because of shortness of breath? no  6. Had increased swelling in your feet or ankles? no  7. Had symptoms of dehydration (dizziness, dry mouth, increased thirst, decreased urine output) no  8. Had changes in sodium restriction? no  9. Been compliant with medication? Yes   ICM trend:   Follow-up plan: ICM clinic phone appointment: 11/22/14  Copy of note sent to patient's primary care physician, primary cardiologist, and device following physician.  Alvis Lemmings, RN, BSN 10/14/2014 4:24 PM

## 2014-10-14 NOTE — Telephone Encounter (Signed)
I attempted to call the patient's nephew back. Went to Designer, television/film set. Will try back.

## 2014-10-18 ENCOUNTER — Ambulatory Visit (INDEPENDENT_AMBULATORY_CARE_PROVIDER_SITE_OTHER): Payer: Medicare Other | Admitting: Adult Health

## 2014-10-18 ENCOUNTER — Encounter: Payer: Self-pay | Admitting: Adult Health

## 2014-10-18 VITALS — BP 132/62 | HR 87 | Ht 64.0 in | Wt 132.0 lb

## 2014-10-18 DIAGNOSIS — I255 Ischemic cardiomyopathy: Secondary | ICD-10-CM | POA: Diagnosis not present

## 2014-10-18 DIAGNOSIS — I10 Essential (primary) hypertension: Secondary | ICD-10-CM

## 2014-10-18 NOTE — Progress Notes (Signed)
Cardiology Office Note   Date:  10/18/2014   ID:  Savannah, Holden 1940-11-13, MRN [id number]  PCP:  [name], MD  Cardiologist:  Bryna Colander, NP   Chief Complaint  Patient presents with  . Hypertension  . Congestive Heart Failure      History of Present Illness: [name] is a 74 y.o. female who presents for ongoing assessment and management of chronic systolic heart failure, CAD, ICD in situ (Medtronic), hypertension, with a chronic left bundle branch block.  Last seen in our officeFollowup labs revealed sodium 139, potassium 4.5, chloride 102, CO2 23, glucose 98, creatinine 1.23.  Her magnesium was 2.0. on 09/17/2014 post discharge, after admission for CHF.  Discharge weight was 131 pounds.    She was seen in the office on 09/17/2014 complaining of hypotension, and dizziness with tiredness.  Orthostatics were completed in the office and they were negative.  I decreased her lisinopril to 20 mg daily from 40 mg daily and BMET and magnesium were checked.  There is no evidence of decompensated CHF.  She comes today with no further complaints with exception of arthritis.  She is medically compliant.   Past Medical History  Diagnosis Date  . Arteriosclerotic cardiovascular disease (ASCVD)     Remote PTCA by patient report; LBBB; associated cardiomyopathy, presumed ischemic with EF 40-45% previously, 20% in 06/2009; h/o clinical congestive heart failure; negative stress nuclear in 2009 with inferoseptal and apical scar  . Anemia     Hgb of 9-10  . Hyperlipidemia   . Breast cancer   . Hypertension   . Automatic implantable cardioverter-defibrillator in situ   . HOH (hard of hearing)   . Anemia of chronic disease     Hgb of 9-10 chronically; 06/2010: H&H-10.7/33.5, MCV-81, normal iron studies in 2010     Past Surgical History  Procedure Laterality Date  . Total knee arthroplasty Right     Dr.Harrison  . Bi-ventricular implantable cardioverter defibrillator   (crt-d)  07/28/2012  . Cataract extraction w/ intraocular lens implant Left   . Tubal ligation    . Breast biopsy Bilateral   . Mastectomy Left 1998  . Cataract extraction w/phaco Right 09/15/2013    Procedure: CATARACT EXTRACTION PHACO AND INTRAOCULAR LENS PLACEMENT (IOC);  Surgeon: Elta Guadeloupe T. Gershon Crane, MD;  Location: [facility];  Service: Ophthalmology;  Laterality: Right;  CDE:  10.74  . Bi-ventricular implantable cardioverter defibrillator N/A 07/28/2012    Procedure: BI-VENTRICULAR IMPLANTABLE CARDIOVERTER DEFIBRILLATOR  (CRT-D);  Surgeon: Evans Lance, MD;  Location: [facility] CATH LAB;  Service: Cardiovascular;  Laterality: N/A;     Current Outpatient Prescriptions  Medication Sig Dispense Refill  . acetaminophen (TYLENOL) 500 MG tablet Take 1,000 mg by mouth every 6 (six) hours as needed.     [name] aspirin 325 MG EC tablet Take 325 mg by mouth daily.    . carvedilol (COREG) 6.25 MG tablet TAKE 1 TABLET (6.25 MG TOTAL) BY MOUTH 2 (TWO) TIMES DAILY WITH A MEAL. 60 tablet 5  . furosemide (LASIX) 40 MG tablet TAKE 1 TABLET BY MOUTH TWICE A DAY 60 tablet 6  . hydroxychloroquine (PLAQUENIL) 200 MG tablet Take 100-200 mg by mouth 2 (two) times daily. Takes a whole tablet in the morning and half a tablet in the evening .    . lisinopril (PRINIVIL,ZESTRIL) 20 MG tablet Take 1 tablet (20 mg total) by mouth daily. 90 tablet 3  . lovastatin (MEVACOR) 40 MG tablet Take 1 tablet (40 mg  total) by mouth at bedtime. 30 tablet 1  . omeprazole (PRILOSEC) 20 MG capsule Take 20 mg by mouth daily. For gas/heartburn    . potassium chloride (K-DUR) 10 MEQ tablet Take 1 tablet (10 mEq total) by mouth 2 (two) times daily. 60 tablet 3  . PROAIR HFA 108 (90 BASE) MCG/ACT inhaler Inhale 2 puffs into the lungs every 4 (four) hours as needed for wheezing or shortness of breath.      No current facility-administered medications for this visit.    Allergies:   Review of patient's allergies indicates no known allergies.    Social  History:  The patient  reports that she quit smoking about 7 years ago. Her smoking use included Cigarettes. She started smoking about 58 years ago. She smoked 0.25 packs per day. Her smokeless tobacco use includes Chew. She reports that she does not drink alcohol or use illicit drugs.   Family History:  The patient's family history includes Cancer in her father; Diabetes in her father; Hypertension in her brother.    ROS: .   All other systems are reviewed and negative.Unless otherwise mentioned in H&P above.   PHYSICAL EXAM: VS:  BP 132/62 mmHg  Pulse 87  Ht 5\' 4"  (1.626 m)  Wt 132 lb (59.875 kg)  BMI 22.65 kg/m2  SpO2 94% , BMI Body mass index is 22.65 kg/(m^2). GEN: Well nourished, well developed, in no acute distress HEENT: normal Neck: no JVD, carotid bruits, or masses Cardiac: RRR; no murmurs, rubs, or gallops,no edema  Respiratory:  clear to auscultation bilaterally, normal work of breathing GI: soft, nontender, nondistended, + BS MS: no deformity or atrophy Skin: warm and dry, no rash Neuro:  Strength and sensation are intact. Hard of  hearing Psych: euthymic mood, full affect  Recent Labs: 08/28/2014: B Natriuretic Peptide 1300.0* 08/29/2014: ALT 10*; Hemoglobin 9.3*; Platelets 311 09/17/2014: BUN 65*; Creat 1.23*; Magnesium 2.0; Potassium 4.5; Sodium 139    Lipid Panel    Component Value Date/Time   CHOL 134 05/23/2012 0905   TRIG 103 05/23/2012 0905   HDL 30* 05/23/2012 0905   CHOLHDL 4.5 05/23/2012 0905   VLDL 21 05/23/2012 0905   LDLCALC 83 05/23/2012 0905      Wt Readings from Last 3 Encounters:  10/18/14 132 lb (59.875 kg)  09/17/14 132 lb (59.875 kg)  09/10/14 133 lb (60.328 kg)      Other studies Reviewed: Additional studies/ records that were reviewed today include: None Review of the above records demonstrates: N/A   ASSESSMENT AND PLAN:  1. CAD: Chronic left bundle branch block, with ischemic heart myopathy, EF of 20%  patient is doing well  without complaints.  Will continue current medication regimen.   She will have her ICD checked for mostly and will followup with Dr. Lovena Le as directed. 2 generalized fatigue, and weakness, I am reluctant to increase her beta blocker at this time.  Heart rate is in the 80s.  She denies any palpitations, or heart racing.  Most recent labs reveal a creatinine of 1.23, and I am also reluctant to increase her ACE inhibitor as well.  2. ICD in situ: patient has a Medtronic ICD pacemaker, and has been remotely checked.  She sees Dr. Lovena Le once a year.  3. Arthritis: patient states she has trouble opening her pills andwater bottles.  She has a son, who helped prepare her medications.  She is requesting help at home.  I will refer her to [facility] for evaluation and  assistance, which they can help her to connect with.   Current medicines are reviewed at length with the patient today.    Labs/ tests ordered today include: None No orders of the defined types were placed in this encounter.     Disposition:   FU with 3 monrhs Signed, Jory Sims, NP  10/18/2014 1:53 PM    [facility] [address] [place], [place] [address] Phone: [phone]; Fax: [phone]

## 2014-10-18 NOTE — Progress Notes (Deleted)
Name: [name]    DOB: [birth year]  Age: 74 y.o.  MR#: [id number]       PCP:  [name], MD      Insurance: Payor: [employer] MEDICARE / Plan: [facility]IC[facility] / Product Type: *No Product type* /   CC:    Chief Complaint  Patient presents with  . Hypertension  . Congestive Heart Failure    VS Filed Vitals:   10/18/14 1335  BP: 132/62  Pulse: 87  Height: 5\' 4"  (1.626 m)  Weight: 132 lb (59.875 kg)  SpO2: 94%    Weights Current Weight  10/18/14 132 lb (59.875 kg)  09/17/14 132 lb (59.875 kg)  09/10/14 133 lb (60.328 kg)    Blood Pressure  BP Readings from Last 3 Encounters:  10/18/14 132/62  09/17/14 110/61  09/10/14 118/60     Admit date:  (Not on file) Last encounter with RMR:  09/17/2014   Allergy Review of patient's allergies indicates no known allergies.  Current Outpatient Prescriptions  Medication Sig Dispense Refill  . acetaminophen (TYLENOL) 500 MG tablet Take 1,000 mg by mouth every 6 (six) hours as needed.     [name] aspirin 325 MG EC tablet Take 325 mg by mouth daily.    . carvedilol (COREG) 6.25 MG tablet TAKE 1 TABLET (6.25 MG TOTAL) BY MOUTH 2 (TWO) TIMES DAILY WITH A MEAL. 60 tablet 5  . furosemide (LASIX) 40 MG tablet TAKE 1 TABLET BY MOUTH TWICE A DAY 60 tablet 6  . hydroxychloroquine (PLAQUENIL) 200 MG tablet Take 100-200 mg by mouth 2 (two) times daily. Takes a whole tablet in the morning and half a tablet in the evening .    . lisinopril (PRINIVIL,ZESTRIL) 20 MG tablet Take 1 tablet (20 mg total) by mouth daily. 90 tablet 3  . lovastatin (MEVACOR) 40 MG tablet Take 1 tablet (40 mg total) by mouth at bedtime. 30 tablet 1  . omeprazole (PRILOSEC) 20 MG capsule Take 20 mg by mouth daily. For gas/heartburn    . potassium chloride (K-DUR) 10 MEQ tablet Take 1 tablet (10 mEq total) by mouth 2 (two) times daily. 60 tablet 3  . PROAIR HFA 108 (90 BASE) MCG/ACT inhaler Inhale 2 puffs into the lungs every 4 (four) hours as needed for wheezing or shortness  of breath.      No current facility-administered medications for this visit.    Discontinued Meds:    Medications Discontinued During This Encounter  Medication Reason  . nitroGLYCERIN (NITROSTAT) 0.4 MG SL tablet Error    Patient Active Problem List   Diagnosis Date Noted  . CAD S/P remote PCI- no details 09/02/2014  . Cardiomyopathy, ischemic-EF 30-35% March 2015 09/02/2014  . Diastolic dysfunction-grade 2 09/02/2014  . CHF exacerbation 08/28/2014  . Acute on chronic combined systolic and diastolic congestive heart failure 08/28/2014  . Iron deficiency anemia 08/20/2013  . Malnutrition of moderate degree 07/21/2013  . PNA (pneumonia) 07/19/2013  . HCAP (healthcare-associated pneumonia) 07/17/2013  . Sepsis 07/17/2013  . ARF (acute renal failure) 07/17/2013  . Rheumatoid arthritis 07/04/2013  . Hypokalemia 07/03/2013  . Pain and swelling of wrist 07/03/2013  . Chest pain 07/03/2013  . Biventricular ICD in place (MDT 2014) 11/06/2012  . Anemia of chronic disease   . Breast cancer   . Hypertension   . Dyslipidemia 05/24/2009  . Chronic systolic heart failure 99991111    LABS    Component Value Date/Time   NA 139 09/17/2014 1347   NA  133* 09/06/2014 0609   NA 132* 09/05/2014 0616   K 4.5 09/17/2014 1347   K 4.6 09/06/2014 0609   K 4.5 09/05/2014 0616   CL 102 09/17/2014 1347   CL 97* 09/06/2014 0609   CL 95* 09/05/2014 0616   CO2 23 09/17/2014 1347   CO2 26 09/06/2014 0609   CO2 23 09/05/2014 0616   GLUCOSE 98 09/17/2014 1347   GLUCOSE 94 09/06/2014 0609   GLUCOSE 84 09/05/2014 0616   BUN 65* 09/17/2014 1347   BUN 34* 09/06/2014 0609   BUN 39* 09/05/2014 0616   CREATININE 1.23* 09/17/2014 1347   CREATININE 1.18* 09/06/2014 0609   CREATININE 1.49* 09/05/2014 0616   CREATININE 1.94* 09/04/2014 1516   CREATININE 0.76 09/11/2013 1005   CREATININE 0.90 07/14/2012 1330   CALCIUM 9.1 09/17/2014 1347   CALCIUM 9.1 09/06/2014 0609   CALCIUM 9.2 09/05/2014 0616    GFRNONAA 44* 09/06/2014 0609   GFRNONAA 33* 09/05/2014 0616   GFRNONAA 24* 09/04/2014 1516   GFRAA 51* 09/06/2014 0609   GFRAA 38* 09/05/2014 0616   GFRAA 28* 09/04/2014 1516   CMP     Component Value Date/Time   NA 139 09/17/2014 1347   K 4.5 09/17/2014 1347   CL 102 09/17/2014 1347   CO2 23 09/17/2014 1347   GLUCOSE 98 09/17/2014 1347   BUN 65* 09/17/2014 1347   CREATININE 1.23* 09/17/2014 1347   CREATININE 1.18* 09/06/2014 0609   CALCIUM 9.1 09/17/2014 1347   PROT 8.0 08/29/2014 0725   ALBUMIN 2.7* 08/29/2014 0725   AST 16 08/29/2014 0725   ALT 10* 08/29/2014 0725   ALKPHOS 89 08/29/2014 0725   BILITOT 0.5 08/29/2014 0725   GFRNONAA 44* 09/06/2014 0609   GFRAA 51* 09/06/2014 0609       Component Value Date/Time   WBC 8.6 08/29/2014 0725   WBC 8.0 08/28/2014 1913   WBC 8.0 08/28/2014 1608   HGB 9.3* 08/29/2014 0725   HGB 9.6* 08/28/2014 1913   HGB 9.4* 08/28/2014 1608   HCT 29.3* 08/29/2014 0725   HCT 30.9* 08/28/2014 1913   HCT 30.3* 08/28/2014 1608   MCV 77.3* 08/29/2014 0725   MCV 78.0 08/28/2014 1913   MCV 78.1 08/28/2014 1608    Lipid Panel     Component Value Date/Time   CHOL 134 05/23/2012 0905   TRIG 103 05/23/2012 0905   HDL 30* 05/23/2012 0905   CHOLHDL 4.5 05/23/2012 0905   VLDL 21 05/23/2012 0905   LDLCALC 83 05/23/2012 0905    ABG No results found for: PHART, PCO2ART, PO2ART, HCO3, TCO2, ACIDBASEDEF, O2SAT   Lab Results  Component Value Date   TSH 0.501 07/03/2013   BNP (last 3 results)  Recent Labs  08/28/14 1608  BNP 1300.0*    ProBNP (last 3 results) No results for input(s): PROBNP in the last 8760 hours.  Cardiac Panel (last 3 results) No results for input(s): CKTOTAL, CKMB, TROPONINI, RELINDX in the last 72 hours.  Iron/TIBC/Ferritin/ %Sat    Component Value Date/Time   IRON 31* 07/30/2014 1048   TIBC 184* 07/30/2014 1048   FERRITIN 159 07/30/2014 1048   IRONPCTSAT 17* 07/30/2014 1048     EKG Orders placed or  performed during the hospital encounter of 08/28/14  . ED EKG  . ED EKG  . EKG 12-Lead  . EKG 12-Lead  . EKG     Prior Assessment and Plan Problem List as of 10/18/2014      Cardiovascular and Mediastinum  Chronic systolic heart failure   Last Assessment & Plan 09/10/2014 Office Visit Written 09/10/2014  9:49 AM by Evans Lance, MD    Her chronic systolic heart failure is well compensated. She will continue her current meds. Her fluid index is normal.       Hypertension   Last Assessment & Plan 09/10/2014 Office Visit Written 09/10/2014  9:49 AM by Evans Lance, MD    Her blood pressure is well controlled. No change in meds.       CAD S/P remote PCI- no details   Cardiomyopathy, ischemic-EF 30-35% March 2015   CHF exacerbation   Acute on chronic combined systolic and diastolic congestive heart failure     Respiratory   HCAP (healthcare-associated pneumonia)   PNA (pneumonia)     Musculoskeletal and Integument   Rheumatoid arthritis   Last Assessment & Plan 08/05/2014 Office Visit Written 08/05/2014 10:49 AM by Baird Cancer, PA-C    Followed by Dr. Miachel Roux and currently on Plaquenil        Genitourinary   ARF (acute renal failure)     Other   Dyslipidemia   Last Assessment & Plan 06/09/2012 Office Visit Edited 06/09/2012  5:53 PM by Yehuda Savannah, MD    Acceptable lipid profile last year, but as long she requires treatment with a statin, a higher dose will produce even a more favorable effect on her serum lipids and will be ordered.      Anemia of chronic disease   Last Assessment & Plan 08/05/2014 Office Visit Edited 08/05/2014 10:47 AM by Baird Cancer, PA-C    Multifactorial with a definite element of anemia of chronic disease secondary to rheumatoid arthritis and its treatment, anemia of chronic renal disease, and iron deficiency anemia.    Work-up thus far demonstrates an anemia of chronic disease picture.  She is not symptomatic from an anemia  standpoint and therefore ESA therapy is not indicated.  Labs in 3 and 6 months: CBC diff, iron/TIBC, ferritin, retic count  Return in 6 months for follow-up.      Biventricular ICD in place (MDT 2014)   Last Assessment & Plan 09/10/2014 Office Visit Written 09/10/2014  9:50 AM by Evans Lance, MD    Her medtronic biv ICD is working normally. Will recheck in several months.      Diastolic dysfunction-grade 2   Breast cancer   Hypokalemia   Pain and swelling of wrist   Chest pain   Sepsis   Malnutrition of moderate degree   Iron deficiency anemia   Last Assessment & Plan 06/03/2014 Office Visit Written 06/21/2014  6:17 PM by Molli Hazard, MD    74 year old female who presented with microcytic anemia and iron deficiency. In spite of iron replacement she remains quite anemic. She has evidence of only mild chronic kidney disease. Fecal occult blood was negative back in March 2015. There is no recent colonoscopy or EGD documented in Epic. I recommended a more extensive anemia evaluation to which she agrees. We will draw additional laboratory studies today and I will keep her apprised results when available. If all of her laboratory studies are normal we will discuss additional valuation at her follow-up when she returns in 2 months.          Imaging: No results found.

## 2014-10-18 NOTE — Patient Instructions (Signed)
Your physician recommends that you schedule a follow-up appointment in: 3 months with Arnold Long, NP.  Your physician recommends that you continue on your current medications as directed. Please refer to the Current Medication list given to you today.  You have been referred to [facility]  Thank you for choosing [facility]!

## 2014-10-18 NOTE — Patient Outreach (Addendum)
[facility]) Care Management  10/18/2014  [name] 07/21/1940 [id number]   Referral received from MD and assigned to [facility] ([place] area) for patient outreach.   Yee Joss L. Elbert Ewings [facility] Care Management Assistant [phone]

## 2014-10-22 DIAGNOSIS — I5022 Chronic systolic (congestive) heart failure: Secondary | ICD-10-CM | POA: Diagnosis not present

## 2014-10-22 DIAGNOSIS — H35363 Drusen (degenerative) of macula, bilateral: Secondary | ICD-10-CM | POA: Diagnosis not present

## 2014-10-22 DIAGNOSIS — N390 Urinary tract infection, site not specified: Secondary | ICD-10-CM | POA: Diagnosis not present

## 2014-10-22 DIAGNOSIS — K219 Gastro-esophageal reflux disease without esophagitis: Secondary | ICD-10-CM | POA: Diagnosis not present

## 2014-10-22 DIAGNOSIS — M069 Rheumatoid arthritis, unspecified: Secondary | ICD-10-CM | POA: Diagnosis not present

## 2014-10-22 DIAGNOSIS — H44113 Panuveitis, bilateral: Secondary | ICD-10-CM | POA: Diagnosis not present

## 2014-10-26 ENCOUNTER — Other Ambulatory Visit: Payer: Self-pay | Admitting: *Deleted

## 2014-10-26 ENCOUNTER — Encounter: Payer: Self-pay | Admitting: *Deleted

## 2014-10-26 NOTE — Patient Outreach (Signed)
[facility]) Care Management  10/26/2014  SARAHBETH ORLOSKY [birth year] [id number]   MD referral: This patient is not on all payer list and is not eligible for [facility] care management services. Advise only. Telephone call to [facility] in MD office ([place]). Advised that patient not eligible for [facility] care management services.Durward Fortes may have disease management//care management program through her insurance provider. Advised her to call patient's insurance provider to enroll patient if eligible. Alda Berthold thanked RN CM for information.  Plan: will close out case and send MD closure letter. Sherrin Daisy, RN BSN [place] Management Coordinator [facility] Care Management  [phone]

## 2014-11-04 ENCOUNTER — Other Ambulatory Visit (HOSPITAL_COMMUNITY): Payer: Medicare Other

## 2014-11-08 DIAGNOSIS — I5022 Chronic systolic (congestive) heart failure: Secondary | ICD-10-CM | POA: Diagnosis not present

## 2014-11-11 DIAGNOSIS — I5022 Chronic systolic (congestive) heart failure: Secondary | ICD-10-CM | POA: Diagnosis not present

## 2014-11-14 ENCOUNTER — Other Ambulatory Visit: Payer: Self-pay | Admitting: Cardiovascular Disease

## 2014-11-15 DIAGNOSIS — I5022 Chronic systolic (congestive) heart failure: Secondary | ICD-10-CM | POA: Diagnosis not present

## 2014-11-16 DIAGNOSIS — I5022 Chronic systolic (congestive) heart failure: Secondary | ICD-10-CM | POA: Diagnosis not present

## 2014-11-17 DIAGNOSIS — I5022 Chronic systolic (congestive) heart failure: Secondary | ICD-10-CM | POA: Diagnosis not present

## 2014-11-19 ENCOUNTER — Encounter (HOSPITAL_COMMUNITY): Payer: Self-pay

## 2014-11-22 ENCOUNTER — Ambulatory Visit (INDEPENDENT_AMBULATORY_CARE_PROVIDER_SITE_OTHER): Payer: Medicare Other | Admitting: *Deleted

## 2014-11-22 DIAGNOSIS — I5022 Chronic systolic (congestive) heart failure: Secondary | ICD-10-CM | POA: Diagnosis not present

## 2014-11-22 DIAGNOSIS — Z95810 Presence of automatic (implantable) cardiac defibrillator: Secondary | ICD-10-CM | POA: Diagnosis not present

## 2014-11-25 ENCOUNTER — Encounter: Payer: Self-pay | Admitting: *Deleted

## 2014-11-25 DIAGNOSIS — I5022 Chronic systolic (congestive) heart failure: Secondary | ICD-10-CM | POA: Diagnosis not present

## 2014-11-25 NOTE — Progress Notes (Signed)
EPIC Encounter for ICM Monitoring  Patient Name: [name] is a 74 y.o. female Date: 11/25/2014 Primary Care Physican: [name], MD Primary Cardiologist: Woodroe Chen, NP Electrophysiologist: Druscilla Brownie Weight: 132 lbs   Bi-V pacing: 94.5 %      In the past month, have you:  1. Gained more than 2 pounds in a day or more than 5 pounds in a week? no  2. Had changes in your medications (with verification of current medications)? no  3. Had more shortness of breath than is usual for you? no  4. Limited your activity because of shortness of breath? no  5. Not been able to sleep because of shortness of breath? no  6. Had increased swelling in your feet or ankles? no  7. Had symptoms of dehydration (dizziness, dry mouth, increased thirst, decreased urine output) no  8. Had changes in sodium restriction? no  9. Been compliant with medication? Yes   ICM trend:   Follow-up plan: ICM clinic phone appointment: 12/29/14. No changes made today.  Copy of note sent to patient's primary care physician, primary cardiologist, and device following physician.  Alvis Lemmings, RN, BSN 11/25/2014 12:24 PM

## 2014-12-01 ENCOUNTER — Encounter (HOSPITAL_COMMUNITY): Payer: Medicare Other | Attending: Hematology & Oncology

## 2014-12-01 DIAGNOSIS — D538 Other specified nutritional anemias: Secondary | ICD-10-CM

## 2014-12-01 DIAGNOSIS — D649 Anemia, unspecified: Secondary | ICD-10-CM | POA: Insufficient documentation

## 2014-12-01 LAB — IRON AND TIBC
Iron: 22 ug/dL — ABNORMAL LOW (ref 28–170)
SATURATION RATIOS: 12 % (ref 10.4–31.8)
TIBC: 176 ug/dL — AB (ref 250–450)
UIBC: 154 ug/dL

## 2014-12-01 LAB — CBC WITH DIFFERENTIAL/PLATELET
BASOS ABS: 0.1 10*3/uL (ref 0.0–0.1)
Basophils Relative: 1 % (ref 0–1)
Eosinophils Absolute: 0.1 10*3/uL (ref 0.0–0.7)
Eosinophils Relative: 1 % (ref 0–5)
HEMATOCRIT: 24.8 % — AB (ref 36.0–46.0)
HEMOGLOBIN: 7.6 g/dL — AB (ref 12.0–15.0)
LYMPHS PCT: 25 % (ref 12–46)
Lymphs Abs: 1.5 10*3/uL (ref 0.7–4.0)
MCH: 23 pg — ABNORMAL LOW (ref 26.0–34.0)
MCHC: 30.6 g/dL (ref 30.0–36.0)
MCV: 75.2 fL — ABNORMAL LOW (ref 78.0–100.0)
MONO ABS: 0.9 10*3/uL (ref 0.1–1.0)
Monocytes Relative: 14 % — ABNORMAL HIGH (ref 3–12)
Neutro Abs: 3.5 10*3/uL (ref 1.7–7.7)
Neutrophils Relative %: 59 % (ref 43–77)
PLATELETS: 315 10*3/uL (ref 150–400)
RBC: 3.3 MIL/uL — AB (ref 3.87–5.11)
RDW: 18.6 % — AB (ref 11.5–15.5)
WBC: 6 10*3/uL (ref 4.0–10.5)

## 2014-12-01 LAB — RETICULOCYTES
RBC.: 3.3 MIL/uL — ABNORMAL LOW (ref 3.87–5.11)
RETIC COUNT ABSOLUTE: 59.4 10*3/uL (ref 19.0–186.0)
Retic Ct Pct: 1.8 % (ref 0.4–3.1)

## 2014-12-01 LAB — FERRITIN: Ferritin: 254 ng/mL (ref 11–307)

## 2014-12-01 NOTE — Progress Notes (Signed)
LABS DRAWN

## 2014-12-02 DIAGNOSIS — I5022 Chronic systolic (congestive) heart failure: Secondary | ICD-10-CM | POA: Diagnosis not present

## 2014-12-03 ENCOUNTER — Other Ambulatory Visit (HOSPITAL_COMMUNITY): Payer: Self-pay | Admitting: Oncology

## 2014-12-03 DIAGNOSIS — D638 Anemia in other chronic diseases classified elsewhere: Secondary | ICD-10-CM

## 2014-12-03 DIAGNOSIS — D509 Iron deficiency anemia, unspecified: Secondary | ICD-10-CM

## 2014-12-09 DIAGNOSIS — I5022 Chronic systolic (congestive) heart failure: Secondary | ICD-10-CM | POA: Diagnosis not present

## 2014-12-13 DIAGNOSIS — D649 Anemia, unspecified: Secondary | ICD-10-CM | POA: Diagnosis not present

## 2014-12-14 ENCOUNTER — Encounter (HOSPITAL_BASED_OUTPATIENT_CLINIC_OR_DEPARTMENT_OTHER): Payer: Medicare Other

## 2014-12-14 DIAGNOSIS — D638 Anemia in other chronic diseases classified elsewhere: Secondary | ICD-10-CM

## 2014-12-14 DIAGNOSIS — D509 Iron deficiency anemia, unspecified: Secondary | ICD-10-CM

## 2014-12-14 DIAGNOSIS — D649 Anemia, unspecified: Secondary | ICD-10-CM

## 2014-12-14 LAB — CBC
HCT: 24 % — ABNORMAL LOW (ref 36.0–46.0)
HEMOGLOBIN: 7.5 g/dL — AB (ref 12.0–15.0)
MCH: 23.7 pg — ABNORMAL LOW (ref 26.0–34.0)
MCHC: 31.3 g/dL (ref 30.0–36.0)
MCV: 75.7 fL — ABNORMAL LOW (ref 78.0–100.0)
PLATELETS: 329 10*3/uL (ref 150–400)
RBC: 3.17 MIL/uL — ABNORMAL LOW (ref 3.87–5.11)
RDW: 18.1 % — ABNORMAL HIGH (ref 11.5–15.5)
WBC: 7.1 10*3/uL (ref 4.0–10.5)

## 2014-12-14 LAB — OCCULT BLOOD X 1 CARD TO LAB, STOOL
FECAL OCCULT BLD: POSITIVE — AB
FECAL OCCULT BLD: POSITIVE — AB
Fecal Occult Bld: POSITIVE — AB

## 2014-12-14 LAB — FERRITIN: Ferritin: 238 ng/mL (ref 11–307)

## 2014-12-14 NOTE — Progress Notes (Signed)
Labs drawn

## 2014-12-15 LAB — IRON AND TIBC
Iron: 27 ug/dL — ABNORMAL LOW (ref 28–170)
SATURATION RATIOS: 16 % (ref 10.4–31.8)
TIBC: 169 ug/dL — ABNORMAL LOW (ref 250–450)
UIBC: 142 ug/dL

## 2014-12-15 NOTE — Addendum Note (Signed)
Addended by: Donnie Aho on: 12/15/2014 10:44 AM   Modules accepted: Orders

## 2014-12-19 ENCOUNTER — Other Ambulatory Visit: Payer: Self-pay | Admitting: Internal Medicine

## 2014-12-24 DIAGNOSIS — I5022 Chronic systolic (congestive) heart failure: Secondary | ICD-10-CM | POA: Diagnosis not present

## 2014-12-29 ENCOUNTER — Ambulatory Visit (INDEPENDENT_AMBULATORY_CARE_PROVIDER_SITE_OTHER): Payer: Medicare Other | Admitting: *Deleted

## 2014-12-29 DIAGNOSIS — I5022 Chronic systolic (congestive) heart failure: Secondary | ICD-10-CM

## 2014-12-29 DIAGNOSIS — Z95810 Presence of automatic (implantable) cardiac defibrillator: Secondary | ICD-10-CM

## 2014-12-29 DIAGNOSIS — I255 Ischemic cardiomyopathy: Secondary | ICD-10-CM

## 2014-12-29 NOTE — Progress Notes (Signed)
EPIC Encounter for ICM Monitoring  Patient Name: [name] is a 74 y.o. female Date: 12/29/2014 Primary Care Physican: [name], MD Primary Cardiologist: [name] Electrophysiologist: Lovena Le Dry Weight: 128 lbs       Bi-V Pacing 94.1%  In the past month, have you:  1. Gained more than 2 pounds in a day or more than 5 pounds in a week? no  2. Had changes in your medications (with verification of current medications)? no  3. Had more shortness of breath than is usual for you? no  4. Limited your activity because of shortness of breath? no  5. Not been able to sleep because of shortness of breath? no  6. Had increased swelling in your feet or ankles? no  7. Had symptoms of dehydration (dizziness, dry mouth, increased thirst, decreased urine output) no  8. Had changes in sodium restriction? no  9. Been compliant with medication? Yes   ICM trend:   Follow-up plan: ICM clinic phone appointment 01/31/2015.  No changes today.  Patient reported she is doing well.   Copy of note sent to patient's primary care physician, primary cardiologist, and device following physician.  [name], RN, CCM 12/29/2014 11:07 AM

## 2015-01-03 DIAGNOSIS — M0579 Rheumatoid arthritis with rheumatoid factor of multiple sites without organ or systems involvement: Secondary | ICD-10-CM | POA: Diagnosis not present

## 2015-01-03 DIAGNOSIS — M25531 Pain in right wrist: Secondary | ICD-10-CM | POA: Diagnosis not present

## 2015-01-03 DIAGNOSIS — E790 Hyperuricemia without signs of inflammatory arthritis and tophaceous disease: Secondary | ICD-10-CM | POA: Diagnosis not present

## 2015-01-03 DIAGNOSIS — Z09 Encounter for follow-up examination after completed treatment for conditions other than malignant neoplasm: Secondary | ICD-10-CM | POA: Diagnosis not present

## 2015-01-09 ENCOUNTER — Telehealth: Payer: Self-pay | Admitting: Adult Health

## 2015-01-12 LAB — CUP PACEART REMOTE DEVICE CHECK
Brady Statistic AP VS Percent: 0.91 %
Brady Statistic AS VP Percent: 86.49 %
HIGH POWER IMPEDANCE MEASURED VALUE: 78 Ohm
Lead Channel Impedance Value: 380 Ohm
Lead Channel Impedance Value: 399 Ohm
Lead Channel Impedance Value: 494 Ohm
Lead Channel Impedance Value: 627 Ohm
Lead Channel Pacing Threshold Pulse Width: 0.4 ms
Lead Channel Pacing Threshold Pulse Width: 0.5 ms
Lead Channel Sensing Intrinsic Amplitude: 26.125 mV
Lead Channel Setting Pacing Amplitude: 1.75 V
Lead Channel Setting Pacing Amplitude: 2.5 V
Lead Channel Setting Pacing Pulse Width: 0.5 ms
MDC IDC MSMT BATTERY REMAINING LONGEVITY: 86 mo
MDC IDC MSMT BATTERY VOLTAGE: 3 V
MDC IDC MSMT LEADCHNL LV IMPEDANCE VALUE: 817 Ohm
MDC IDC MSMT LEADCHNL LV PACING THRESHOLD AMPLITUDE: 0.625 V
MDC IDC MSMT LEADCHNL RA IMPEDANCE VALUE: 380 Ohm
MDC IDC MSMT LEADCHNL RA PACING THRESHOLD AMPLITUDE: 0.5 V
MDC IDC MSMT LEADCHNL RA SENSING INTR AMPL: 1.875 mV
MDC IDC MSMT LEADCHNL RA SENSING INTR AMPL: 1.875 mV
MDC IDC MSMT LEADCHNL RV PACING THRESHOLD AMPLITUDE: 1.25 V
MDC IDC MSMT LEADCHNL RV PACING THRESHOLD PULSEWIDTH: 0.4 ms
MDC IDC MSMT LEADCHNL RV SENSING INTR AMPL: 26.125 mV
MDC IDC SESS DTM: 20160907052307
MDC IDC SET LEADCHNL RA PACING AMPLITUDE: 2 V
MDC IDC SET LEADCHNL RV PACING PULSEWIDTH: 0.4 ms
MDC IDC SET LEADCHNL RV SENSING SENSITIVITY: 0.3 mV
MDC IDC SET ZONE DETECTION INTERVAL: 350 ms
MDC IDC STAT BRADY AP VP PERCENT: 11.14 %
MDC IDC STAT BRADY AS VS PERCENT: 1.46 %
MDC IDC STAT BRADY RA PERCENT PACED: 12.05 %
MDC IDC STAT BRADY RV PERCENT PACED: 0.51 %
Zone Setting Detection Interval: 270 ms
Zone Setting Detection Interval: 360 ms
Zone Setting Detection Interval: 360 ms

## 2015-01-21 ENCOUNTER — Encounter: Payer: Self-pay | Admitting: Cardiology

## 2015-01-24 DIAGNOSIS — K219 Gastro-esophageal reflux disease without esophagitis: Secondary | ICD-10-CM | POA: Diagnosis not present

## 2015-01-24 DIAGNOSIS — M069 Rheumatoid arthritis, unspecified: Secondary | ICD-10-CM | POA: Diagnosis not present

## 2015-01-24 DIAGNOSIS — Z23 Encounter for immunization: Secondary | ICD-10-CM | POA: Diagnosis not present

## 2015-01-24 DIAGNOSIS — I5022 Chronic systolic (congestive) heart failure: Secondary | ICD-10-CM | POA: Diagnosis not present

## 2015-01-24 DIAGNOSIS — J069 Acute upper respiratory infection, unspecified: Secondary | ICD-10-CM | POA: Diagnosis not present

## 2015-01-24 DIAGNOSIS — I10 Essential (primary) hypertension: Secondary | ICD-10-CM | POA: Diagnosis not present

## 2015-01-24 DIAGNOSIS — I509 Heart failure, unspecified: Secondary | ICD-10-CM | POA: Diagnosis not present

## 2015-01-26 ENCOUNTER — Other Ambulatory Visit (HOSPITAL_COMMUNITY): Payer: Self-pay | Admitting: Internal Medicine

## 2015-01-26 DIAGNOSIS — Z1231 Encounter for screening mammogram for malignant neoplasm of breast: Secondary | ICD-10-CM

## 2015-01-28 DIAGNOSIS — R195 Other fecal abnormalities: Secondary | ICD-10-CM | POA: Diagnosis not present

## 2015-01-31 ENCOUNTER — Ambulatory Visit (INDEPENDENT_AMBULATORY_CARE_PROVIDER_SITE_OTHER): Payer: Medicare Other

## 2015-01-31 ENCOUNTER — Telehealth: Payer: Self-pay

## 2015-01-31 DIAGNOSIS — I5022 Chronic systolic (congestive) heart failure: Secondary | ICD-10-CM

## 2015-01-31 DIAGNOSIS — Z95810 Presence of automatic (implantable) cardiac defibrillator: Secondary | ICD-10-CM | POA: Diagnosis not present

## 2015-01-31 NOTE — Telephone Encounter (Signed)
ICM transmission received.  Attempted patient call and no answer or answering machine.

## 2015-02-01 NOTE — Progress Notes (Signed)
EPIC Encounter for ICM Monitoring  Patient Name: [name] is a 74 y.o. female Date: 02/01/2015 Primary Care Physican: [name], MD Primary Cardiologist: [name] Electrophysiologist: Lovena Le Dry Weight: 128 lb       In the past month, have you:  1. Gained more than 2 pounds in a day or more than 5 pounds in a week? no  2. Had changes in your medications (with verification of current medications)? no  3. Had more shortness of breath than is usual for you? no  4. Limited your activity because of shortness of breath? no  5. Not been able to sleep because of shortness of breath? no  6. Had increased swelling in your feet or ankles? no  7. Had symptoms of dehydration (dizziness, dry mouth, increased thirst, decreased urine output) no  8. Had changes in sodium restriction? no  9. Been compliant with medication? Yes   ICM trend:   Follow-up plan: ICM clinic phone appointment on 03/04/2015.  Optivol impedance slightly below baseline at time of transmission and patient denied any symptoms.  She stated she is feeling fine.  No changes today.    Copy of note sent to patient's primary care physician, primary cardiologist, and device following physician.  [name], RN, CCM 02/01/2015 3:23 PM

## 2015-02-01 NOTE — Telephone Encounter (Signed)
Spoke with patient.

## 2015-02-02 ENCOUNTER — Encounter: Payer: Self-pay | Admitting: Internal Medicine

## 2015-02-03 ENCOUNTER — Encounter (HOSPITAL_COMMUNITY): Payer: Self-pay | Admitting: Hematology & Oncology

## 2015-02-03 ENCOUNTER — Encounter (HOSPITAL_BASED_OUTPATIENT_CLINIC_OR_DEPARTMENT_OTHER): Payer: Medicare Other

## 2015-02-03 ENCOUNTER — Other Ambulatory Visit (HOSPITAL_COMMUNITY)
Admission: RE | Admit: 2015-02-03 | Discharge: 2015-02-03 | Disposition: A | Discharge status: Home / Self Care | Payer: Medicare Other | Source: Ambulatory Visit | Attending: Interventional Radiology | Admitting: Interventional Radiology

## 2015-02-03 ENCOUNTER — Encounter (HOSPITAL_COMMUNITY): Payer: Medicare Other | Attending: Hematology & Oncology | Admitting: Hematology & Oncology

## 2015-02-03 VITALS — BP 122/47 | HR 75 | Temp 98.3°F | Resp 16 | Wt 135.1 lb

## 2015-02-03 DIAGNOSIS — D649 Anemia, unspecified: Secondary | ICD-10-CM | POA: Diagnosis not present

## 2015-02-03 DIAGNOSIS — Z79899 Other long term (current) drug therapy: Secondary | ICD-10-CM | POA: Insufficient documentation

## 2015-02-03 DIAGNOSIS — I2510 Atherosclerotic heart disease of native coronary artery without angina pectoris: Secondary | ICD-10-CM | POA: Diagnosis not present

## 2015-02-03 DIAGNOSIS — E611 Iron deficiency: Secondary | ICD-10-CM

## 2015-02-03 DIAGNOSIS — R0789 Other chest pain: Secondary | ICD-10-CM

## 2015-02-03 DIAGNOSIS — N182 Chronic kidney disease, stage 2 (mild): Secondary | ICD-10-CM | POA: Diagnosis not present

## 2015-02-03 DIAGNOSIS — M2550 Pain in unspecified joint: Secondary | ICD-10-CM | POA: Insufficient documentation

## 2015-02-03 DIAGNOSIS — Z853 Personal history of malignant neoplasm of breast: Secondary | ICD-10-CM

## 2015-02-03 LAB — COMPREHENSIVE METABOLIC PANEL
ALBUMIN: 2.6 g/dL — AB (ref 3.5–5.0)
ALK PHOS: 93 U/L (ref 38–126)
ALT: 11 U/L — ABNORMAL LOW (ref 14–54)
ANION GAP: 9 (ref 5–15)
AST: 18 U/L (ref 15–41)
BUN: 19 mg/dL (ref 6–20)
CALCIUM: 8.9 mg/dL (ref 8.9–10.3)
CHLORIDE: 102 mmol/L (ref 101–111)
CO2: 27 mmol/L (ref 22–32)
Creatinine, Ser: 1.09 mg/dL — ABNORMAL HIGH (ref 0.44–1.00)
GFR calc non Af Amer: 48 mL/min — ABNORMAL LOW (ref >60)
GFR, EST AFRICAN AMERICAN: 56 mL/min — AB (ref >60)
Glucose, Bld: 101 mg/dL — ABNORMAL HIGH (ref 65–99)
Potassium: 4.2 mmol/L (ref 3.5–5.1)
SODIUM: 138 mmol/L (ref 135–145)
Total Bilirubin: 0.3 mg/dL (ref 0.3–1.2)
Total Protein: 8.2 g/dL — ABNORMAL HIGH (ref 6.5–8.1)

## 2015-02-03 LAB — CBC WITH DIFFERENTIAL/PLATELET
BASOS ABS: 0 10*3/uL (ref 0.0–0.1)
Basophils Relative: 1 %
EOS PCT: 1 %
Eosinophils Absolute: 0.1 10*3/uL (ref 0.0–0.7)
HCT: 22.5 % — ABNORMAL LOW (ref 36.0–46.0)
Hemoglobin: 6.8 g/dL — CL (ref 12.0–15.0)
LYMPHS PCT: 21 %
Lymphs Abs: 1.6 10*3/uL (ref 0.7–4.0)
MCH: 23.2 pg — ABNORMAL LOW (ref 26.0–34.0)
MCHC: 30.2 g/dL (ref 30.0–36.0)
MCV: 76.8 fL — AB (ref 78.0–100.0)
MONO ABS: 1 10*3/uL (ref 0.1–1.0)
MONOS PCT: 13 %
Neutro Abs: 5.1 10*3/uL (ref 1.7–7.7)
Neutrophils Relative %: 65 %
PLATELETS: 313 10*3/uL (ref 150–400)
RBC: 2.93 MIL/uL — ABNORMAL LOW (ref 3.87–5.11)
RDW: 18.3 % — AB (ref 11.5–15.5)
WBC: 7.9 10*3/uL (ref 4.0–10.5)

## 2015-02-03 LAB — SEDIMENTATION RATE: Sed Rate: 130 mm/hr — ABNORMAL HIGH (ref 0–22)

## 2015-02-03 LAB — FERRITIN: Ferritin: 164 ng/mL (ref 11–307)

## 2015-02-03 LAB — PREPARE RBC (CROSSMATCH)

## 2015-02-03 LAB — URIC ACID: URIC ACID, SERUM: 9.6 mg/dL — AB (ref 2.3–6.6)

## 2015-02-03 LAB — ABO/RH: ABO/RH(D): AB POS

## 2015-02-03 LAB — RETICULOCYTES
RBC.: 2.93 MIL/uL — AB (ref 3.87–5.11)
Retic Count, Absolute: 44 10*3/uL (ref 19.0–186.0)
Retic Ct Pct: 1.5 % (ref 0.4–3.1)

## 2015-02-03 NOTE — Telephone Encounter (Signed)
CRITICAL VALUE ALERT Critical value received: Hemoglobin 6.8  Date of notification:  02/03/15 Time of notification: [id number]  Critical value read back:  Yes.   Nurse who received alert:  Mickie Kay, RN MD notified (1st page):  Dr. Whitney Muse @ [phone]

## 2015-02-03 NOTE — Patient Instructions (Signed)
[place] at [facility] Discharge Instructions  RECOMMENDATIONS MADE BY THE CONSULTANT AND ANY TEST RESULTS WILL BE SENT TO YOUR REFERRING PHYSICIAN.  Exam and discussion by Dr. Whitney Muse Your hemoglobin is very low it's 8.6 and we need to give you some blood Will make a referral to Dr. Laural Golden.  They will call you with an appointment.  Follow-up; Blood transfusion tomorrow Office visit in 2 weeks.  Thank you for choosing [place] at [facility] to provide your oncology and hematology care.  To afford each patient quality time with our provider, please arrive at least 15 minutes before your scheduled appointment time.    You need to re-schedule your appointment should you arrive 10 or more minutes late.  We strive to give you quality time with our providers, and arriving late affects you and other patients whose appointments are after yours.  Also, if you no show three or more times for appointments you may be dismissed from the clinic at the providers discretion.     Again, thank you for choosing [facility].  Our hope is that these requests will decrease the amount of time that you wait before being seen by our physicians.       _____________________________________________________________  Should you have questions after your visit to [facility], please contact our office at [phone] between the hours of 8:30 a.m. and 4:30 p.m.  Voicemails left after 4:30 p.m. will not be returned until the following business day.  For prescription refill requests, have your pharmacy contact our office.

## 2015-02-03 NOTE — Progress Notes (Signed)
[place], MD [address] [place] [address]    DIAGNOSIS: Iron deficiency anemia, microcytic anemia with hgb 8.5, mcv 70 on 09/02/2013 Carcinoma of the Left breast diagnosed in 1992, modified radical mastectomy, 5 years tamoxifen, no XRT CKD, stage II mild No record of recent EGD/C-scope Fecal occult blood 07/03/13 negative  CURRENT THERAPY: IV iron  INTERVAL HISTORY: [name] 74 y.o. female returns for follow-up of a history of carcinoma the left breast diagnosed back in 1992. She also has an anemia for which she has received IV iron. In spite of IV iron replacement she has remained anemic with her last hemoglobin at 9.4. At presentation in early 2015 she had a microcytic anemia and documented iron deficiency. She complains of fatigue but otherwise has no other major concerns.   The patient denies chest pain.  She complains of chest tightness, occurring both when she has a bra on or off.  She notes that her fluid medication makes her sometimes feel better.  Denies bloody, sticky, or black stools.  She has soreness in her right ankle.  She has had her flu vaccination.  She denies SOB, She says her appetite is good. On review of her weights she has had a slow decline over the past several years. She does not have a recent GI evaluation in [facility], she cannot recall the last GI eval she has had.  She has chronic systolic heart failure, CAD, ICD and a chronic LBBB.   MEDICAL HISTORY: Past Medical History  Diagnosis Date  . Arteriosclerotic cardiovascular disease (ASCVD)     Remote PTCA by patient report; LBBB; associated cardiomyopathy, presumed ischemic with EF 40-45% previously, 20% in 06/2009; h/o clinical congestive heart failure; negative stress nuclear in 2009 with inferoseptal and apical scar  . Anemia     Hgb of 9-10  . Hyperlipidemia   . Breast cancer ([place])   . Hypertension   . Automatic implantable cardioverter-defibrillator in situ   . HOH (hard of  hearing)   . Anemia of chronic disease     Hgb of 9-10 chronically; 06/2010: H&H-10.7/33.5, MCV-81, normal iron studies in 2010     has Dyslipidemia; Chronic systolic heart failure ([place]); Anemia of chronic disease; Breast cancer ([place]); Hypertension; Biventricular ICD in place (MDT 2014); Hypokalemia; Pain and swelling of wrist; Chest pain; Rheumatoid arthritis ([place]); HCAP (healthcare-associated pneumonia); Sepsis ([place]); ARF (acute renal failure) ([place]); PNA (pneumonia); Malnutrition of moderate degree ([place]); Iron deficiency anemia; CHF exacerbation ([place]); Acute on chronic combined systolic and diastolic congestive heart failure ([place]); CAD S/P remote PCI- no details; Cardiomyopathy, ischemic-EF 30-35% March 1028; and Diastolic dysfunction-grade 2 on her problem list.     has No Known Allergies.  Ms. Gilden had no medications administered during this visit.  SURGICAL HISTORY: Past Surgical History  Procedure Laterality Date  . Total knee arthroplasty Right     Dr.Harrison  . Bi-ventricular implantable cardioverter defibrillator  (crt-d)  07/28/2012  . Cataract extraction w/ intraocular lens implant Left   . Tubal ligation    . Breast biopsy Bilateral   . Mastectomy Left 1998  . Cataract extraction w/phaco Right 09/15/2013    Procedure: CATARACT EXTRACTION PHACO AND INTRAOCULAR LENS PLACEMENT (IOC);  Surgeon: Elta Guadeloupe T. Gershon Crane, MD;  Location: [facility];  Service: Ophthalmology;  Laterality: Right;  CDE:  10.74  . Bi-ventricular implantable cardioverter defibrillator N/A 07/28/2012    Procedure: BI-VENTRICULAR IMPLANTABLE CARDIOVERTER DEFIBRILLATOR  (CRT-D);  Surgeon: Evans Lance, MD;  Location:  [facility];  Service: Cardiovascular;  Laterality: N/A;    SOCIAL HISTORY: Social History   Social History  . Marital Status: Married    Spouse Name: N/A  . Number of Children: N/A  . Years of Education: N/A   Occupational History  . Retired    Social History Main Topics  . Smoking status: Former  Smoker -- 0.25 packs/day    Types: Cigarettes    Start date: 09/17/1956    Quit date: 04/24/2007  . Smokeless tobacco: Current User    Types: Chew     Comment: 07/03/2013 "smoked some; don't know how much or how long or when I quit"  . Alcohol Use: No  . Drug Use: No  . Sexual Activity: Not Currently   Other Topics Concern  . Not on file   Social History Narrative    FAMILY HISTORY: Family History  Problem Relation Age of Onset  . Diabetes Father   . Cancer Father   . Hypertension Brother     Review of Systems  Constitutional: Positive for malaise/fatigue. Negative for fever, chills and weight loss.  HENT: Negative for congestion, hearing loss, nosebleeds, sore throat and tinnitus.   Eyes: Negative for blurred vision, double vision, pain and discharge.  Respiratory: Negative for cough, hemoptysis, sputum production, shortness of breath and wheezing.   Cardiovascular: Negative for chest pain, palpitations, claudication, leg swelling and PND.  Gastrointestinal: Negative for heartburn, nausea, vomiting, abdominal pain, diarrhea, constipation, blood in stool and melena.  Genitourinary: Negative for dysuria, urgency, frequency and hematuria.  Musculoskeletal: Negative for myalgias, joint pain and falls.  Skin: Negative for itching and rash.  Neurological: Negative for dizziness, tingling, tremors, sensory change, speech change, focal weakness, seizures, loss of consciousness, weakness and headaches.  Endo/Heme/Allergies: Does not bruise/bleed easily.  Psychiatric/Behavioral: Negative for depression, suicidal ideas, memory loss and substance abuse. The patient is not nervous/anxious and does not have insomnia.   14 point review of systems was performed and is negative except as detailed under history of present illness and above  PHYSICAL EXAMINATION  ECOG PERFORMANCE STATUS: 1 - Symptomatic but completely ambulatory  Filed Vitals:   02/03/15 1200  BP: 122/47  Pulse: 75    Temp: 98.3 F (36.8 C)  Resp: 16    Physical Exam  Constitutional: She is oriented to person, place, and time and well-developed, well-nourished, and in no distress.  HENT:  Head: Normocephalic and atraumatic.  Nose: Nose normal.  Mouth/Throat: Oropharynx is clear and moist. No oropharyngeal exudate.  Eyes: Conjunctivae and EOM are normal. Pupils are equal, round, and reactive to light. Right eye exhibits no discharge. Left eye exhibits no discharge. No scleral icterus.  Neck: Normal range of motion. Neck supple. No tracheal deviation present. No thyromegaly present.  Cardiovascular: Normal rate, regular rhythm and normal heart sounds.  Exam reveals no gallop and no friction rub.   No murmur heard. Pulmonary/Chest: Effort normal and breath sounds normal. She has no wheezes. She has no rales.  Abdominal: Soft. Bowel sounds are normal. She exhibits no distension and no mass. There is no tenderness. There is no rebound and no guarding.  Musculoskeletal: Normal range of motion. She exhibits no edema.  Lymphadenopathy:    She has no cervical adenopathy.  Neurological: She is alert and oriented to person, place, and time. She has normal reflexes. No cranial nerve deficit. Gait normal. Coordination normal.  Skin: Skin is warm and dry. No rash noted.  Psychiatric: Mood, memory, affect and judgment  normal.  Nursing note and vitals reviewed.   LABORATORY DATA: I have reviewed the data below as listed. CBC    Component Value Date/Time   WBC 7.9 02/03/2015 1025   RBC 2.93* 02/03/2015 1025   RBC 2.93* 02/03/2015 1025   HGB 6.8* 02/03/2015 1025   HCT 22.5* 02/03/2015 1025   PLT 313 02/03/2015 1025   MCV 76.8* 02/03/2015 1025   MCH 23.2* 02/03/2015 1025   MCHC 30.2 02/03/2015 1025   RDW 18.3* 02/03/2015 1025   LYMPHSABS 1.6 02/03/2015 1025   MONOABS 1.0 02/03/2015 1025   EOSABS 0.1 02/03/2015 1025   BASOSABS 0.0 02/03/2015 1025   CMP     Component Value Date/Time   NA 138  02/03/2015 1027   K 4.2 02/03/2015 1027   CL 102 02/03/2015 1027   CO2 27 02/03/2015 1027   GLUCOSE 101* 02/03/2015 1027   BUN 19 02/03/2015 1027   CREATININE 1.09* 02/03/2015 1027   CREATININE 1.23* 09/17/2014 1347   CALCIUM 8.9 02/03/2015 1027   PROT 8.2* 02/03/2015 1027   ALBUMIN 2.6* 02/03/2015 1027   AST 18 02/03/2015 1027   ALT 11* 02/03/2015 1027   ALKPHOS 93 02/03/2015 1027   BILITOT 0.3 02/03/2015 1027   GFRNONAA 48* 02/03/2015 1027   GFRAA 56* 02/03/2015 1027       ASSESSMENT and THERAPY PLAN:  Anemia History of iron deficiency, currently normal iron studies CAD, ICD, chronic LBBB CKD, stage II Normal B12 and folate 2/2/106 History Elevated ESR and CRP 05/2014 Normal SPEP/IEP in 2010   Cause of her anemia at this point is not completely clear. She has multiple contributing factors, I do not feel these explain the severity of her current anemia. She is refusing an inpatient admission for transfusion overnight. She agrees to return in the morning for a blood transfusion. I am referring her to GI for consideration of GI workup.  She has no history of a bone marrow biopsy and this may need to be considered as well. She does have a history of significantly elevated inflammatory markers.  I am going to bring her back in 2 weeks after her transfusion with a repeat CBC. Hopefully she will have seen GI at that point. We can then make additional recommendations moving forward.  All questions were answered. The patient knows to call the clinic with any problems, questions or concerns. We can certainly see the patient much sooner if necessary.   This document serves as a record of services personally performed by Ancil Linsey, MD. It was created on her behalf by Janace Hoard, a trained medical scribe. The creation of this record is based on the scribe's personal observations and the provider's statements to them. This document has been checked and approved by the attending  provider.  I have reviewed the above documentation for accuracy and completeness, and I agree with the above.  This note was electronically signed.  Kelby Fam. Whitney Muse, MD

## 2015-02-03 NOTE — Progress Notes (Signed)
This encounter was created in error - please disregard.

## 2015-02-04 ENCOUNTER — Encounter (HOSPITAL_BASED_OUTPATIENT_CLINIC_OR_DEPARTMENT_OTHER): Payer: Medicare Other

## 2015-02-04 VITALS — BP 123/53 | HR 67 | Temp 97.9°F | Resp 16

## 2015-02-04 DIAGNOSIS — D649 Anemia, unspecified: Secondary | ICD-10-CM

## 2015-02-04 MED ORDER — DIPHENHYDRAMINE HCL 25 MG PO CAPS
25.0000 mg | ORAL_CAPSULE | Freq: Once | ORAL | Status: AC
  Administered 2015-02-04: 25 mg via ORAL

## 2015-02-04 MED ORDER — ACETAMINOPHEN 325 MG PO TABS
ORAL_TABLET | ORAL | Status: AC
  Filled 2015-02-04: qty 2

## 2015-02-04 MED ORDER — SODIUM CHLORIDE 0.9 % IV SOLN
250.0000 mL | Freq: Once | INTRAVENOUS | Status: AC
  Administered 2015-02-04: 250 mL via INTRAVENOUS

## 2015-02-04 MED ORDER — DIPHENHYDRAMINE HCL 25 MG PO CAPS
ORAL_CAPSULE | ORAL | Status: AC
  Filled 2015-02-04: qty 1

## 2015-02-04 MED ORDER — ACETAMINOPHEN 325 MG PO TABS
650.0000 mg | ORAL_TABLET | Freq: Once | ORAL | Status: AC
  Administered 2015-02-04: 650 mg via ORAL

## 2015-02-04 NOTE — Progress Notes (Signed)
Blood consent signed.  Patient tolerated infusion well.  VSS throughout transfusion and after.

## 2015-02-04 NOTE — Patient Instructions (Signed)
[place] at [facility] Discharge Instructions  RECOMMENDATIONS MADE BY THE CONSULTANT AND ANY TEST RESULTS WILL BE SENT TO YOUR REFERRING PHYSICIAN.  You received two units of blood today.   Blood Transfusion  A blood transfusion is a procedure in which you receive donated blood through an IV tube. You may need a blood transfusion because of illness, surgery, or injury. The blood may come from a donor, or it may be your own blood that you donated previously. The blood given in a transfusion is made up of different types of cells. You may receive:  Red blood cells. These carry oxygen and replace lost blood.  Platelets. These control bleeding.  Plasma. Thishelps blood to clot. If you have hemophilia or another clotting disorder, you may also receive other types of blood products. LET [facility] CARE PROVIDER KNOW ABOUT:  Any allergies you have.  All medicines you are taking, including vitamins, herbs, eye drops, creams, and over-the-counter medicines.  Previous problems you or members of your family have had with the use of anesthetics.  Any blood disorders you have.  Previous surgeries you have had.  Any medical conditions you may have.  Any previous reactions you have had during a blood transfusion.  RISKS AND COMPLICATIONS Generally, this is a safe procedure. However, problems may occur, including:  Having an allergic reaction to something in the donated blood.  Fever. This may be a reaction to the white blood cells in the transfused blood.  Iron overload. This can happen from having many transfusions.  Transfusion-related acute lung injury (TRALI). This is a rare reaction that causes lung damage. The cause is not known.TRALI can occur within hours of a transfusion or several days later.  Sudden (acute) or delayed hemolytic reactions. This happens if your blood does not match the cells in your transfusion. Your body's defense system (immune  system) may try to attack the new cells. This complication is rare.  Infection. This is rare. BEFORE THE PROCEDURE  You may have a blood test to determine your blood type. This is necessary to know what kind of blood your body will accept.  If you are going to have a planned surgery, you may donate your own blood. This may be done in case you need to have a transfusion.  If you have had an allergic reaction to a transfusion in the past, you may be given medicine to help prevent a reaction. Take this medicine only as directed by your health care provider.  You will have your temperature, blood pressure, and pulse monitored before the transfusion. PROCEDURE   An IV will be started in your hand or arm.  The bag of donated blood will be attached to your IV tube and given into your vein.  Your temperature, blood pressure, and pulse will be monitored regularly during the transfusion. This monitoring is done to detect early signs of a transfusion reaction.  If you have any signs or symptoms of a reaction, your transfusion will be stopped and you may be given medicine.  When the transfusion is over, your IV will be removed.  Pressure may be applied to the IV site for a few minutes.  A bandage (dressing) will be applied. The procedure may vary among health care providers and hospitals. AFTER THE PROCEDURE  Your blood pressure, temperature, and pulse will be monitored regularly.   This information is not intended to replace advice given to you by your health care provider. Make sure  you discuss any questions you have with your health care provider.   Document Released: 04/06/2000 Document Revised: 04/30/2014 Document Reviewed: 02/17/2014 Elsevier Interactive Patient Education 2016 [employer].   Thank you for choosing [place] at [facility] to provide your oncology and hematology care.  To afford each patient quality time with our provider, please arrive at  least 15 minutes before your scheduled appointment time.    You need to re-schedule your appointment should you arrive 10 or more minutes late.  We strive to give you quality time with our providers, and arriving late affects you and other patients whose appointments are after yours.  Also, if you no show three or more times for appointments you may be dismissed from the clinic at the providers discretion.     Again, thank you for choosing [facility].  Our hope is that these requests will decrease the amount of time that you wait before being seen by our physicians.       _____________________________________________________________  Should you have questions after your visit to [facility], please contact our office at [phone] between the hours of 8:30 a.m. and 4:30 p.m.  Voicemails left after 4:30 p.m. will not be returned until the following business day.  For prescription refill requests, have your pharmacy contact our office.

## 2015-02-04 NOTE — Progress Notes (Signed)
Labs drawn

## 2015-02-05 LAB — TYPE AND SCREEN
ABO/RH(D): AB POS
Antibody Screen: NEGATIVE
UNIT DIVISION: 0
UNIT DIVISION: 0

## 2015-02-08 ENCOUNTER — Encounter (HOSPITAL_COMMUNITY): Payer: Self-pay | Admitting: *Deleted

## 2015-02-10 ENCOUNTER — Other Ambulatory Visit (INDEPENDENT_AMBULATORY_CARE_PROVIDER_SITE_OTHER): Payer: Self-pay | Admitting: *Deleted

## 2015-02-10 ENCOUNTER — Encounter (INDEPENDENT_AMBULATORY_CARE_PROVIDER_SITE_OTHER): Payer: Self-pay | Admitting: Internal Medicine

## 2015-02-10 ENCOUNTER — Other Ambulatory Visit (INDEPENDENT_AMBULATORY_CARE_PROVIDER_SITE_OTHER): Payer: Self-pay | Admitting: Internal Medicine

## 2015-02-10 ENCOUNTER — Encounter (INDEPENDENT_AMBULATORY_CARE_PROVIDER_SITE_OTHER): Payer: Self-pay | Admitting: *Deleted

## 2015-02-10 ENCOUNTER — Ambulatory Visit (INDEPENDENT_AMBULATORY_CARE_PROVIDER_SITE_OTHER): Payer: Medicare Other | Admitting: Internal Medicine

## 2015-02-10 VITALS — BP 112/74 | HR 72 | Temp 98.7°F | Ht 64.75 in | Wt 135.7 lb

## 2015-02-10 DIAGNOSIS — D509 Iron deficiency anemia, unspecified: Secondary | ICD-10-CM | POA: Diagnosis not present

## 2015-02-10 DIAGNOSIS — R195 Other fecal abnormalities: Secondary | ICD-10-CM

## 2015-02-10 NOTE — Patient Instructions (Signed)
Colonoscopy/EGD.

## 2015-02-10 NOTE — Progress Notes (Signed)
Subjective:    Patient ID: [name], female    DOB: 08/06/1940, 74 y.o.   MRN: [id number]  HPIPatient is very hard of hearing. Referred to our office by Dr. Whitney Muse for anemia and receives iron infusion. She thinks she receives iron about every 6 months. Noted 02/03/2015 her hemoglobin was 6.8. She was transfused with 2 units of PRBCs. Hz of breast cancer in 1992 with a left mastectomy.  She tells me she has not seen any blood in her stools. Stoos are brown in color. Her appetite is good. No weight loss. No abdominal pain.  No family hx of colon cancer. She had 3 positive stool cards in August of this year.  Iron/TIBC/Ferritin/ %Sat    Component Value Date/Time   IRON 27* 12/14/2014 0947   TIBC 169* 12/14/2014 0947   FERRITIN 164 02/03/2015 1026   IRONPCTSAT 16 12/14/2014 0947     CBC Latest Ref Rng 02/03/2015 12/14/2014 12/01/2014  WBC 4.0 - 10.5 K/uL 7.9 7.1 6.0  Hemoglobin 12.0 - 15.0 g/dL 6.8(LL) 7.5(L) 7.6(L)  Hematocrit 36.0 - 46.0 % 22.5(L) 24.0(L) 24.8(L)  Platelets 150 - 400 K/uL 313 329 315   07/07/2007 Colonoscopy: Hx of colon polyps. Small polyp ablated from splenic flexure. Biopsy: tubular adenoma.     Review of Systems     Past Medical History  Diagnosis Date  . Arteriosclerotic cardiovascular disease (ASCVD)     Remote PTCA by patient report; LBBB; associated cardiomyopathy, presumed ischemic with EF 40-45% previously, 20% in 06/2009; h/o clinical congestive heart failure; negative stress nuclear in 2009 with inferoseptal and apical scar  . Anemia     Hgb of 9-10  . Hyperlipidemia   . Breast cancer ([place])   . Hypertension   . Automatic implantable cardioverter-defibrillator in situ   . HOH (hard of hearing)   . Anemia of chronic disease     Hgb of 9-10 chronically; 06/2010: H&H-10.7/33.5, MCV-81, normal iron studies in 2010    Current Outpatient Prescriptions  Medication Sig Dispense Refill  . acetaminophen (TYLENOL) 500 MG tablet Take 1,000 mg by  mouth every 6 (six) hours as needed.     [name] aspirin 325 MG EC tablet Take 325 mg by mouth 2 (two) times daily before a meal.     . carvedilol (COREG) 6.25 MG tablet TAKE 1 TABLET (6.25 MG TOTAL) BY MOUTH 2 (TWO) TIMES DAILY WITH A MEAL. 60 tablet 5  . furosemide (LASIX) 40 MG tablet TAKE 1 TABLET BY MOUTH TWICE A DAY 60 tablet 6  . hydroxychloroquine (PLAQUENIL) 200 MG tablet Take 100-200 mg by mouth 2 (two) times daily. Takes a whole tablet in the morning and half a tablet in the evening .    [name] KLOR-CON M10 10 MEQ tablet TAKE 1 TABLET (10 MEQ TOTAL) BY MOUTH 2 (TWO) TIMES DAILY. 60 tablet 6  . lisinopril (PRINIVIL,ZESTRIL) 20 MG tablet Take 1 tablet (20 mg total) by mouth daily. 90 tablet 3  . lovastatin (MEVACOR) 40 MG tablet TAKE 1 TABLET (40 MG TOTAL) BY MOUTH AT BEDTIME. 30 tablet 1  . omeprazole (PRILOSEC) 20 MG capsule Take 20 mg by mouth daily. For gas/heartburn    . PROAIR HFA 108 (90 BASE) MCG/ACT inhaler Inhale 2 puffs into the lungs every 4 (four) hours as needed for wheezing or shortness of breath.      No current facility-administered medications for this visit.        Objective:   Physical Exam Blood pressure  112/74, pulse 72, temperature 98.7 F (37.1 C), height 5' 4.75" (1.645 m), weight 135 lb 11.2 oz (61.553 kg).    Alert and oriented. Skin warm and dry. Oral mucosa is moist.   . Sclera anicteric, conjunctivae is pink. Thyroid not enlarged. No cervical lymphadenopathy. Lungs clear. Heart regular rate and rhythm.  Abdomen is soft. Bowel sounds are positive. No hepatomegaly. No abdominal masses felt. No tenderness.  No edema to lower extremities.  Stool brown and guaiac positive.   ' Lot [id number] Ex 9/17    Assessment & Plan:  Anemia: Recent transfuston one week ago with 2 units.  Colonscopy/EGD. PUD needs to be ruled out.  Colonic neoplasm, polyp, AVM needs to be ruled out.

## 2015-02-12 ENCOUNTER — Other Ambulatory Visit: Payer: Self-pay | Admitting: Adult Health

## 2015-02-14 ENCOUNTER — Other Ambulatory Visit: Payer: Self-pay

## 2015-02-16 NOTE — Progress Notes (Signed)
[place], MD [place] [address]  Iron deficiency anemia - Plan: CBC with Differential, Iron and TIBC, Ferritin, CBC with Differential  Anemia of chronic disease - Plan: Vitamin B12, Folate, Reticulocytes, Erythropoietin, Haptoglobin, Lactate dehydrogenase, CBC with Differential  Elevated blood protein - Plan: Beta 2 microglobuline, serum, Multiple myeloma panel, serum, Kappa/lambda light chains  Rheumatoid arthritis involving multiple sites, unspecified rheumatoid factor presence (HCC)  CURRENT THERAPY: S/P 2 units of PRBCs on 02/04/2015  INTERVAL HISTORY: SYMPHONY DEMURO 74 y.o. female returns for followup iron deficiency anemia in the setting of Stage II chronic renal disease, requiring infrequent IV replacement, and rheumatoid arthritis, on Plaquenil AND Distant history of left breast cancer in 1992 treated with radial mastectomy followed by 5 years worth of tamoxifen without XRT.  On her last encounter, her severe anemia of noted at 6.8 g/dL.  She was transfused as an outpatient the following day after refusing hospitalization.  She was referred to GI for work-up of severe anemia.  I personally reviewed and went over laboratory results with the patient.  The results are noted within this dictation. Labs will be updated today.  Chart is reviewed.  She has seen Deberah Castle, NP (GI) on 02/10/2015.  GI plan is for colonoscopy and EGD in near future.  I do not see that this is scheduled to date when I review the patient's appointment list.  The patient reports that her colonoscopy is scheduled for 11/5.  She is to go through with her colonoscopy and encouragement to follow the prep directions is urged.  Nursing discovered today that she has not been taking her Plaquenil as prescribed and has been taking 1 tablet BID x months, possibly 3+ months.  She is prescribed Plaquenil 1 tab in AM and 1/2 tab in PM.  I have advised the patient's pharmacist.  He  reports that she has been on time for her monthly prescriptions.  He notes that the patient can request pharmacy to split her pills.  Patient is notified of this.  She denies any complaints and ROS questioning is negative.  Past Medical History  Diagnosis Date  . Arteriosclerotic cardiovascular disease (ASCVD)     Remote PTCA by patient report; LBBB; associated cardiomyopathy, presumed ischemic with EF 40-45% previously, 20% in 06/2009; h/o clinical congestive heart failure; negative stress nuclear in 2009 with inferoseptal and apical scar  . Anemia     Hgb of 9-10  . Hyperlipidemia   . Breast cancer ([place])   . Hypertension   . Automatic implantable cardioverter-defibrillator in situ   . HOH (hard of hearing)   . Anemia of chronic disease     Hgb of 9-10 chronically; 06/2010: H&H-10.7/33.5, MCV-81, normal iron studies in 2010     has Dyslipidemia; Chronic systolic heart failure ([place]); Anemia of chronic disease; Breast cancer ([place]); Hypertension; Biventricular ICD in place (MDT 2014); Hypokalemia; Pain and swelling of wrist; Chest pain; Rheumatoid arthritis ([place]); HCAP (healthcare-associated pneumonia); Sepsis ([place]); ARF (acute renal failure) ([place]); PNA (pneumonia); Malnutrition of moderate degree ([place]); Iron deficiency anemia; CHF exacerbation ([place]); Acute on chronic combined systolic and diastolic congestive heart failure ([place]); CAD S/P remote PCI- no details; Cardiomyopathy, ischemic-EF 30-35% March 4431; and Diastolic dysfunction-grade 2 on her problem list.     has No Known Allergies.  Current Outpatient Prescriptions on File Prior to Visit  Medication Sig Dispense Refill  . acetaminophen (TYLENOL) 500 MG tablet Take 1,000 mg by mouth every  6 (six) hours as needed.     [name] aspirin 325 MG EC tablet Take 325 mg by mouth 2 (two) times daily before a meal.     . carvedilol (COREG) 6.25 MG tablet TAKE 1 TABLET (6.25 MG TOTAL) BY MOUTH 2 (TWO) TIMES DAILY WITH A MEAL. 60 tablet 5  . furosemide  (LASIX) 40 MG tablet TAKE 1 TABLET BY MOUTH TWICE A DAY 60 tablet 6  . hydroxychloroquine (PLAQUENIL) 200 MG tablet Take 100-200 mg by mouth 2 (two) times daily. Takes a whole tablet in the morning and half a tablet in the evening .    [name] KLOR-CON M10 10 MEQ tablet TAKE 1 TABLET (10 MEQ TOTAL) BY MOUTH 2 (TWO) TIMES DAILY. 60 tablet 6  . lisinopril (PRINIVIL,ZESTRIL) 20 MG tablet Take 1 tablet (20 mg total) by mouth daily. 90 tablet 3  . lovastatin (MEVACOR) 40 MG tablet TAKE 1 TABLET (40 MG TOTAL) BY MOUTH AT BEDTIME. 30 tablet 1  . omeprazole (PRILOSEC) 20 MG capsule Take 20 mg by mouth daily. For gas/heartburn    . PROAIR HFA 108 (90 BASE) MCG/ACT inhaler Inhale 2 puffs into the lungs every 4 (four) hours as needed for wheezing or shortness of breath.      No current facility-administered medications on file prior to visit.    Past Surgical History  Procedure Laterality Date  . Total knee arthroplasty Right     Dr.Harrison  . Bi-ventricular implantable cardioverter defibrillator  (crt-d)  07/28/2012  . Cataract extraction w/ intraocular lens implant Left   . Tubal ligation    . Breast biopsy Bilateral   . Mastectomy Left 1998  . Cataract extraction w/phaco Right 09/15/2013    Procedure: CATARACT EXTRACTION PHACO AND INTRAOCULAR LENS PLACEMENT (IOC);  Surgeon: Elta Guadeloupe T. Gershon Crane, MD;  Location: [facility];  Service: Ophthalmology;  Laterality: Right;  CDE:  10.74  . Bi-ventricular implantable cardioverter defibrillator N/A 07/28/2012    Procedure: BI-VENTRICULAR IMPLANTABLE CARDIOVERTER DEFIBRILLATOR  (CRT-D);  Surgeon: Evans Lance, MD;  Location: [facility] CATH LAB;  Service: Cardiovascular;  Laterality: N/A;    Denies any headaches, dizziness, double vision, fevers, chills, night sweats, nausea, vomiting, diarrhea, constipation, chest pain, heart palpitations, shortness of breath, blood in stool, black tarry stool, urinary pain, urinary burning, urinary frequency, hematuria.   PHYSICAL  EXAMINATION  ECOG PERFORMANCE STATUS: 1 - Symptomatic but completely ambulatory  Filed Vitals:   02/17/15 1400  BP: 126/53  Pulse: 72  Temp: 99.3 F (37.4 C)  Resp: 16    GENERAL:alert, no distress, comfortable, cooperative, smiling and unaccompanied SKIN: skin color, texture, turgor are normal, no rashes or significant lesions HEAD: Normocephalic, No masses, lesions, tenderness or abnormalities EYES: normal, PERRLA, EOMI, Conjunctiva are pink and non-injected EARS: External ears normal OROPHARYNX:lips, buccal mucosa, and tongue normal and mucous membranes are moist  NECK: supple, no adenopathy, trachea midline LYMPH:  no palpable lymphadenopathy BREAST:not examined LUNGS: clear to auscultation  HEART: regular rate & rhythm ABDOMEN:abdomen soft and normal bowel sounds BACK: Back symmetric, no curvature. EXTREMITIES:less then 2 second capillary refill, no joint deformities, effusion, or inflammation, no skin discoloration, no cyanosis  NEURO: alert & oriented x 3 with fluent speech, no focal motor/sensory deficits, gait normal   LABORATORY DATA: CBC    Component Value Date/Time   WBC 7.9 02/03/2015 1025   RBC 2.93* 02/03/2015 1025   RBC 2.93* 02/03/2015 1025   HGB 6.8* 02/03/2015 1025   HCT 22.5* 02/03/2015 1025   PLT 313  02/03/2015 1025   MCV 76.8* 02/03/2015 1025   MCH 23.2* 02/03/2015 1025   MCHC 30.2 02/03/2015 1025   RDW 18.3* 02/03/2015 1025   LYMPHSABS 1.6 02/03/2015 1025   MONOABS 1.0 02/03/2015 1025   EOSABS 0.1 02/03/2015 1025   BASOSABS 0.0 02/03/2015 1025      Chemistry      Component Value Date/Time   NA 138 02/03/2015 1027   K 4.2 02/03/2015 1027   CL 102 02/03/2015 1027   CO2 27 02/03/2015 1027   BUN 19 02/03/2015 1027   CREATININE 1.09* 02/03/2015 1027   CREATININE 1.23* 09/17/2014 1347      Component Value Date/Time   CALCIUM 8.9 02/03/2015 1027   ALKPHOS 93 02/03/2015 1027   AST 18 02/03/2015 1027   ALT 11* 02/03/2015 1027   BILITOT  0.3 02/03/2015 1027      Lab Results  Component Value Date   IRON 27* 12/14/2014   TIBC 169* 12/14/2014   FERRITIN 164 02/03/2015     PENDING LABS:   RADIOGRAPHIC STUDIES:  No results found.   PATHOLOGY:    ASSESSMENT AND PLAN:  Anemia of chronic disease Iron deficiency anemia in the setting of Stage II chronic renal disease, requiring infrequent IV replacement, and rheumatoid arthritis, on Plaquenil.  She is S/P 2 units of PRBCs on 02/04/2015 due to an anemia of 6.8 g/dL.  She was referred to GI for consideration for further work-up of anemia.  She was seen by Deberah Castle, NP (GI) on 02/10/2015 with future plans for colonoscopy/EGD.  Labs today: CBC diff, anemia panel, LDH, retic count, Haptoglobin, Retic count.  Additional labs today for an elevated protein level 2 weeks ago: MM panel, B2M.  She is scheduled for colonoscopy by Dr. Laural Golden at the beginning of November 2016 due to acute anemia.  Acute anemia may have been precipitated by inappropriate consumption of Plaquenil.  See below regarding more information regarding this.  Labs in 3 weeks: CBC diff  Return in 3 weeks for follow-up.  Rheumatoid arthritis She is on Plaquenil daily.  Nursing reviewed her dose of Plaquenil with the patient.  Patient admits that she is to be taking 1 tablet in AM and 1/2 tablet in PM.  For several months, possibly back as far as July 2016, she has been taking 1 tablet in AM and 1 tablet in PM because "I can't cut the pills in half.  I have a pill cutter, but my hands are too weak and painful to cut the pill."  [facility] is contacted regarding this issue.  They can split the pills for her at her request.  The patient is advised of this.    However, [facility] pharmacist reports that she is on time for her Rx refills of plaquenil and only get monthly supply.    Information is confusing.  She reports that she will go back to 1 tablet in AM and 1/2 tablet in PM as  prescribed.    THERAPY PLAN:  Colonoscopy is scheduled in 1-2 weeks.  We will update labs today with further investigation of possible causes.  All questions were answered. The patient knows to call the clinic with any problems, questions or concerns. We can certainly see the patient much sooner if necessary.  Patient and plan discussed with Dr. Ancil Linsey and she is in agreement with the aforementioned.   This note is electronically signed by: Doy Mince 02/17/2015 3:11 PM

## 2015-02-16 NOTE — Assessment & Plan Note (Addendum)
Iron deficiency anemia in the setting of Stage II chronic renal disease, requiring infrequent IV replacement, and rheumatoid arthritis, on Plaquenil.  She is S/P 2 units of PRBCs on 02/04/2015 due to an anemia of 6.8 g/dL.  She was referred to GI for consideration for further work-up of anemia.  She was seen by Deberah Castle, NP (GI) on 02/10/2015 with future plans for colonoscopy/EGD.  Labs today: CBC diff, anemia panel, LDH, retic count, Haptoglobin, Retic count.  Additional labs today for an elevated protein level 2 weeks ago: MM panel, B2M.  She is scheduled for colonoscopy by Dr. Laural Golden at the beginning of November 2016 due to acute anemia.  Acute anemia may have been precipitated by inappropriate consumption of Plaquenil.  See below regarding more information regarding this.  Labs in 3 weeks: CBC diff  Return in 3 weeks for follow-up.

## 2015-02-17 ENCOUNTER — Encounter (HOSPITAL_BASED_OUTPATIENT_CLINIC_OR_DEPARTMENT_OTHER): Payer: Medicare Other | Admitting: Oncology

## 2015-02-17 ENCOUNTER — Encounter (HOSPITAL_COMMUNITY): Payer: Self-pay | Admitting: Oncology

## 2015-02-17 VITALS — BP 126/53 | HR 72 | Temp 99.3°F | Resp 16 | Wt 134.8 lb

## 2015-02-17 DIAGNOSIS — D631 Anemia in chronic kidney disease: Secondary | ICD-10-CM

## 2015-02-17 DIAGNOSIS — M069 Rheumatoid arthritis, unspecified: Secondary | ICD-10-CM

## 2015-02-17 DIAGNOSIS — D509 Iron deficiency anemia, unspecified: Secondary | ICD-10-CM

## 2015-02-17 DIAGNOSIS — N182 Chronic kidney disease, stage 2 (mild): Secondary | ICD-10-CM

## 2015-02-17 DIAGNOSIS — D649 Anemia, unspecified: Secondary | ICD-10-CM | POA: Diagnosis not present

## 2015-02-17 DIAGNOSIS — R779 Abnormality of plasma protein, unspecified: Secondary | ICD-10-CM

## 2015-02-17 DIAGNOSIS — D638 Anemia in other chronic diseases classified elsewhere: Secondary | ICD-10-CM

## 2015-02-17 DIAGNOSIS — E8809 Other disorders of plasma-protein metabolism, not elsewhere classified: Secondary | ICD-10-CM

## 2015-02-17 LAB — IRON AND TIBC
IRON: 31 ug/dL (ref 28–170)
Saturation Ratios: 17 % (ref 10.4–31.8)
TIBC: 185 ug/dL — AB (ref 250–450)
UIBC: 154 ug/dL

## 2015-02-17 LAB — CBC WITH DIFFERENTIAL/PLATELET
BASOS ABS: 0.1 10*3/uL (ref 0.0–0.1)
Basophils Relative: 1 %
Eosinophils Absolute: 0.2 10*3/uL (ref 0.0–0.7)
Eosinophils Relative: 2 %
HEMATOCRIT: 34.1 % — AB (ref 36.0–46.0)
Hemoglobin: 11 g/dL — ABNORMAL LOW (ref 12.0–15.0)
LYMPHS ABS: 1.4 10*3/uL (ref 0.7–4.0)
LYMPHS PCT: 18 %
MCH: 25.6 pg — ABNORMAL LOW (ref 26.0–34.0)
MCHC: 32.3 g/dL (ref 30.0–36.0)
MCV: 79.3 fL (ref 78.0–100.0)
MONO ABS: 0.8 10*3/uL (ref 0.1–1.0)
Monocytes Relative: 11 %
NEUTROS ABS: 5.1 10*3/uL (ref 1.7–7.7)
Neutrophils Relative %: 68 %
Platelets: 252 10*3/uL (ref 150–400)
RBC: 4.3 MIL/uL (ref 3.87–5.11)
RDW: 17.8 % — AB (ref 11.5–15.5)
WBC: 7.6 10*3/uL (ref 4.0–10.5)

## 2015-02-17 LAB — RETICULOCYTES
RBC.: 4.3 MIL/uL (ref 3.87–5.11)
Retic Count, Absolute: 17.2 10*3/uL — ABNORMAL LOW (ref 19.0–186.0)
Retic Ct Pct: 0.4 % (ref 0.4–3.1)

## 2015-02-17 LAB — LACTATE DEHYDROGENASE: LDH: 174 U/L (ref 98–192)

## 2015-02-17 LAB — VITAMIN B12: Vitamin B-12: 731 pg/mL (ref 180–914)

## 2015-02-17 LAB — FERRITIN: FERRITIN: 244 ng/mL (ref 11–307)

## 2015-02-17 LAB — FOLATE: Folate: 11.2 ng/mL (ref >5.9)

## 2015-02-17 NOTE — Progress Notes (Signed)
[name] presented for labwork. Labs per MD order drawn via Peripheral Line 23 gauge needle inserted in right AC  Good blood return present. Procedure without incident.  Needle removed intact. Patient tolerated procedure well.  Plaquenil tablets cut in half with pill splitter for patient as she is not able to do this herself.  Instructed her that when med is refilled to ask pharmacy to do this for her.  Stated she would do so.

## 2015-02-17 NOTE — Progress Notes (Signed)
noted 

## 2015-02-17 NOTE — Patient Instructions (Signed)
[place] at [facility] Discharge Instructions  RECOMMENDATIONS MADE BY THE CONSULTANT AND ANY TEST RESULTS WILL BE SENT TO YOUR REFERRING PHYSICIAN.  Exam and discussion by Robynn Pane, PA-C Let your Rheumatologist know that you have been taking too much plaquenil.  Take only what you are suppose to take. Will check some labs today to see what your hemoglobin is today to see if you need blood.  Follow-up in 3 weeks with labs and office visit.  Thank you for choosing [place] at [facility] to provide your oncology and hematology care.  To afford each patient quality time with our provider, please arrive at least 15 minutes before your scheduled appointment time.    You need to re-schedule your appointment should you arrive 10 or more minutes late.  We strive to give you quality time with our providers, and arriving late affects you and other patients whose appointments are after yours.  Also, if you no show three or more times for appointments you may be dismissed from the clinic at the providers discretion.     Again, thank you for choosing [facility].  Our hope is that these requests will decrease the amount of time that you wait before being seen by our physicians.       _____________________________________________________________  Should you have questions after your visit to [facility], please contact our office at [phone] between the hours of 8:30 a.m. and 4:30 p.m.  Voicemails left after 4:30 p.m. will not be returned until the following business day.  For prescription refill requests, have your pharmacy contact our office.

## 2015-02-17 NOTE — Assessment & Plan Note (Signed)
She is on Plaquenil daily.  Nursing reviewed her dose of Plaquenil with the patient.  Patient admits that she is to be taking 1 tablet in AM and 1/2 tablet in PM.  For several months, possibly back as far as July 2016, she has been taking 1 tablet in AM and 1 tablet in PM because "I can't cut the pills in half.  I have a pill cutter, but my hands are too weak and painful to cut the pill."  [facility] is [facility] regarding this issue.  They can split the pills for her at her request.  The patient is advised of this.    However, [facility] pharmacist reports that she is on time for her Rx refills of plaquenil and only get monthly supply.    Information is confusing.  She reports that she will go back to 1 tablet in AM and 1/2 tablet in PM as prescribed.

## 2015-02-18 LAB — KAPPA/LAMBDA LIGHT CHAINS
KAPPA FREE LGHT CHN: 75.9 mg/L — AB (ref 3.30–19.40)
Kappa, lambda light chain ratio: 1.31 (ref 0.26–1.65)
LAMDA FREE LIGHT CHAINS: 58.16 mg/L — AB (ref 5.71–26.30)

## 2015-02-18 LAB — HAPTOGLOBIN: Haptoglobin: 554 mg/dL — ABNORMAL HIGH (ref 34–200)

## 2015-02-18 LAB — BETA 2 MICROGLOBULIN, SERUM: BETA 2 MICROGLOBULIN: 3.9 mg/L — AB (ref 0.6–2.4)

## 2015-02-18 LAB — ERYTHROPOIETIN: ERYTHROPOIETIN: 12.8 m[IU]/mL (ref 2.6–18.5)

## 2015-02-21 LAB — MULTIPLE MYELOMA PANEL, SERUM
ALBUMIN SERPL ELPH-MCNC: 2.7 g/dL — AB (ref 2.9–4.4)
Albumin/Glob SerPl: 0.6 — ABNORMAL LOW (ref 0.7–1.7)
Alpha 1: 0.4 g/dL (ref 0.0–0.4)
Alpha2 Glob SerPl Elph-Mcnc: 1.4 g/dL — ABNORMAL HIGH (ref 0.4–1.0)
B-GLOBULIN SERPL ELPH-MCNC: 1.2 g/dL (ref 0.7–1.3)
GAMMA GLOB SERPL ELPH-MCNC: 2.4 g/dL — AB (ref 0.4–1.8)
Globulin, Total: 5.4 g/dL — ABNORMAL HIGH (ref 2.2–3.9)
IGA: 549 mg/dL — AB (ref 64–422)
IgG (Immunoglobin G), Serum: 2185 mg/dL — ABNORMAL HIGH (ref 700–1600)
IgM, Serum: 434 mg/dL — ABNORMAL HIGH (ref 26–217)
Total Protein ELP: 8.1 g/dL (ref 6.0–8.5)

## 2015-02-28 DIAGNOSIS — I10 Essential (primary) hypertension: Secondary | ICD-10-CM | POA: Diagnosis not present

## 2015-03-02 ENCOUNTER — Ambulatory Visit (HOSPITAL_COMMUNITY)
Admission: RE | Admit: 2015-03-02 | Discharge: 2015-03-02 | Disposition: A | Discharge status: Home / Self Care | Payer: Medicare Other | Source: Ambulatory Visit | Attending: Internal Medicine | Admitting: Internal Medicine

## 2015-03-02 DIAGNOSIS — Z1231 Encounter for screening mammogram for malignant neoplasm of breast: Secondary | ICD-10-CM | POA: Insufficient documentation

## 2015-03-02 DIAGNOSIS — H44113 Panuveitis, bilateral: Secondary | ICD-10-CM | POA: Diagnosis not present

## 2015-03-02 DIAGNOSIS — H35363 Drusen (degenerative) of macula, bilateral: Secondary | ICD-10-CM | POA: Diagnosis not present

## 2015-03-04 ENCOUNTER — Ambulatory Visit (INDEPENDENT_AMBULATORY_CARE_PROVIDER_SITE_OTHER): Payer: Medicare Other

## 2015-03-04 ENCOUNTER — Encounter (HOSPITAL_COMMUNITY): Payer: Self-pay | Admitting: *Deleted

## 2015-03-04 ENCOUNTER — Telehealth: Payer: Self-pay

## 2015-03-04 ENCOUNTER — Ambulatory Visit (HOSPITAL_COMMUNITY)
Admission: RE | Admit: 2015-03-04 | Discharge: 2015-03-04 | Disposition: A | Discharge status: Home / Self Care | Payer: Medicare Other | Source: Ambulatory Visit | Attending: Internal Medicine | Admitting: Internal Medicine

## 2015-03-04 ENCOUNTER — Encounter (HOSPITAL_COMMUNITY)
Admission: RE | Disposition: A | Discharge status: Home / Self Care | Payer: Self-pay | Source: Ambulatory Visit | Attending: Internal Medicine

## 2015-03-04 DIAGNOSIS — M069 Rheumatoid arthritis, unspecified: Secondary | ICD-10-CM | POA: Diagnosis not present

## 2015-03-04 DIAGNOSIS — K31819 Angiodysplasia of stomach and duodenum without bleeding: Secondary | ICD-10-CM | POA: Diagnosis not present

## 2015-03-04 DIAGNOSIS — Z95810 Presence of automatic (implantable) cardiac defibrillator: Secondary | ICD-10-CM | POA: Insufficient documentation

## 2015-03-04 DIAGNOSIS — Z79899 Other long term (current) drug therapy: Secondary | ICD-10-CM | POA: Diagnosis not present

## 2015-03-04 DIAGNOSIS — I10 Essential (primary) hypertension: Secondary | ICD-10-CM | POA: Diagnosis not present

## 2015-03-04 DIAGNOSIS — D509 Iron deficiency anemia, unspecified: Secondary | ICD-10-CM

## 2015-03-04 DIAGNOSIS — Z87891 Personal history of nicotine dependence: Secondary | ICD-10-CM | POA: Diagnosis not present

## 2015-03-04 DIAGNOSIS — I2510 Atherosclerotic heart disease of native coronary artery without angina pectoris: Secondary | ICD-10-CM | POA: Diagnosis not present

## 2015-03-04 DIAGNOSIS — E785 Hyperlipidemia, unspecified: Secondary | ICD-10-CM | POA: Diagnosis not present

## 2015-03-04 DIAGNOSIS — I255 Ischemic cardiomyopathy: Secondary | ICD-10-CM | POA: Insufficient documentation

## 2015-03-04 DIAGNOSIS — Z7982 Long term (current) use of aspirin: Secondary | ICD-10-CM | POA: Insufficient documentation

## 2015-03-04 DIAGNOSIS — I5043 Acute on chronic combined systolic (congestive) and diastolic (congestive) heart failure: Secondary | ICD-10-CM

## 2015-03-04 DIAGNOSIS — Q2733 Arteriovenous malformation of digestive system vessel: Secondary | ICD-10-CM | POA: Diagnosis not present

## 2015-03-04 DIAGNOSIS — R195 Other fecal abnormalities: Secondary | ICD-10-CM | POA: Diagnosis not present

## 2015-03-04 DIAGNOSIS — K644 Residual hemorrhoidal skin tags: Secondary | ICD-10-CM | POA: Insufficient documentation

## 2015-03-04 DIAGNOSIS — Z96651 Presence of right artificial knee joint: Secondary | ICD-10-CM | POA: Insufficient documentation

## 2015-03-04 DIAGNOSIS — K648 Other hemorrhoids: Secondary | ICD-10-CM | POA: Diagnosis not present

## 2015-03-04 HISTORY — PX: ESOPHAGOGASTRODUODENOSCOPY: SHX5428

## 2015-03-04 HISTORY — PX: COLONOSCOPY: SHX5424

## 2015-03-04 SURGERY — COLONOSCOPY
Anesthesia: Moderate Sedation

## 2015-03-04 MED ORDER — MIDAZOLAM HCL 5 MG/5ML IJ SOLN
INTRAMUSCULAR | Status: AC
  Filled 2015-03-04: qty 10

## 2015-03-04 MED ORDER — MEPERIDINE HCL 50 MG/ML IJ SOLN
INTRAMUSCULAR | Status: AC
  Filled 2015-03-04: qty 1

## 2015-03-04 MED ORDER — BUTAMBEN-TETRACAINE-BENZOCAINE 2-2-14 % EX AERO
INHALATION_SPRAY | CUTANEOUS | Status: DC | PRN
  Administered 2015-03-04 (×2): 1 via TOPICAL

## 2015-03-04 MED ORDER — MIDAZOLAM HCL 5 MG/5ML IJ SOLN
INTRAMUSCULAR | Status: DC | PRN
  Administered 2015-03-04: 1 mg via INTRAVENOUS
  Administered 2015-03-04: 2 mg via INTRAVENOUS
  Administered 2015-03-04: 1 mg via INTRAVENOUS

## 2015-03-04 MED ORDER — MEPERIDINE HCL 50 MG/ML IJ SOLN
INTRAMUSCULAR | Status: DC | PRN
  Administered 2015-03-04: 25 mg

## 2015-03-04 MED ORDER — SODIUM CHLORIDE 0.9 % IV SOLN
INTRAVENOUS | Status: DC
  Administered 2015-03-04: 11:00:00 via INTRAVENOUS

## 2015-03-04 NOTE — Discharge Instructions (Signed)
Resume usual medications and diet. No driving for 24 hours. Please check with your cardiologist if aspirin dose could be reduced.     Esophagogastroduodenoscopy, Care After Refer to this sheet in the next few weeks. These instructions provide you with information about caring for yourself after your procedure. Your health care provider may also give you more specific instructions. Your treatment has been planned according to current medical practices, but problems sometimes occur. Call your health care provider if you have any problems or questions after your procedure. WHAT TO EXPECT AFTER THE PROCEDURE After your procedure, it is typical to feel:  Soreness in your throat.  Pain with swallowing.  Sick to your stomach (nauseous).  Bloated.  Dizzy.  Fatigued. HOME CARE INSTRUCTIONS  Do not eat or drink anything until the numbing medicine (local anesthetic) has worn off and your gag reflex has returned. You will know that the local anesthetic has worn off when you can swallow comfortably.  Do not drive or operate machinery until directed by your health care provider.  Take medicines only as directed by your health care provider. SEEK MEDICAL CARE IF:   You cannot stop coughing.  You are not urinating at all or less than usual. SEEK IMMEDIATE MEDICAL CARE IF:  You have difficulty swallowing.  You cannot eat or drink.  You have worsening throat or chest pain.  You have dizziness or lightheadedness or you faint.  You have nausea or vomiting.  You have chills.  You have a fever.  You have severe abdominal pain.  You have black, tarry, or bloody stools.   This information is not intended to replace advice given to you by your health care provider. Make sure you discuss any questions you have with your health care provider.   Document Released: 03/26/2012 Document Revised: 04/30/2014 Document Reviewed: 03/26/2012 Elsevier Interactive Patient Education 2016 [employer].   Colonoscopy, Care After These instructions give you information on caring for yourself after your procedure. Your doctor may also give you more specific instructions. Call your doctor if you have any problems or questions after your procedure. HOME CARE  Do not drive for 24 hours.  Do not sign important papers or use machinery for 24 hours.  You may shower.  You may go back to your usual activities, but go slower for the first 24 hours.  Take rest breaks often during the first 24 hours.  Walk around or use warm packs on your belly (abdomen) if you have belly cramping or gas.  Drink enough fluids to keep your pee (urine) clear or pale yellow.  Resume your normal diet. Avoid heavy or fried foods.  Avoid drinking alcohol for 24 hours or as told by your doctor.  Only take medicines as told by your doctor. If a tissue sample (biopsy) was taken during the procedure:   Do not take aspirin or blood thinners for 7 days, or as told by your doctor.  Do not drink alcohol for 7 days, or as told by your doctor.  Eat soft foods for the first 24 hours. GET HELP IF: You still have a small amount of blood in your poop (stool) 2-3 days after the procedure. GET HELP RIGHT AWAY IF:  You have more than a small amount of blood in your poop.  You see clumps of tissue (blood clots) in your poop.  Your belly is puffy (swollen).  You feel sick to your stomach (nauseous) or throw up (vomit).  You have a fever.  You have belly pain that gets worse and medicine does not help. MAKE SURE YOU:  Understand these instructions.  Will watch your condition.  Will get help right away if you are not doing well or get worse.   This information is not intended to replace advice given to you by your health care provider. Make sure you discuss any questions you have with your health care provider.   Document Released: 05/12/2010 Document Revised: 04/14/2013 Document Reviewed:  12/15/2012 Elsevier Interactive Patient Education 2016 [employer].    Hemorrhoids Hemorrhoids are swollen veins around the rectum or anus. There are two types of hemorrhoids:   Internal hemorrhoids. These occur in the veins just inside the rectum. They may poke through to the outside and become irritated and painful.  External hemorrhoids. These occur in the veins outside the anus and can be felt as a painful swelling or hard lump near the anus. CAUSES  Pregnancy.   Obesity.   Constipation or diarrhea.   Straining to have a bowel movement.   Sitting for long periods on the toilet.  Heavy lifting or other activity that caused you to strain.  Anal intercourse. SYMPTOMS   Pain.   Anal itching or irritation.   Rectal bleeding.   Fecal leakage.   Anal swelling.   One or more lumps around the anus.  DIAGNOSIS  Your caregiver may be able to diagnose hemorrhoids by visual examination. Other examinations or tests that may be performed include:   Examination of the rectal area with a gloved hand (digital rectal exam).   Examination of anal canal using a small tube (scope).   A blood test if you have lost a significant amount of blood.  A test to look inside the colon (sigmoidoscopy or colonoscopy). TREATMENT Most hemorrhoids can be treated at home. However, if symptoms do not seem to be getting better or if you have a lot of rectal bleeding, your caregiver may perform a procedure to help make the hemorrhoids get smaller or remove them completely. Possible treatments include:   Placing a rubber band at the base of the hemorrhoid to cut off the circulation (rubber band ligation).   Injecting a chemical to shrink the hemorrhoid (sclerotherapy).   Using a tool to burn the hemorrhoid (infrared light therapy).   Surgically removing the hemorrhoid (hemorrhoidectomy).   Stapling the hemorrhoid to block blood flow to the tissue (hemorrhoid stapling).   HOME CARE INSTRUCTIONS   Eat foods with fiber, such as whole grains, beans, nuts, fruits, and vegetables. Ask your doctor about taking products with added fiber in them (fibersupplements).  Increase fluid intake. Drink enough water and fluids to keep your urine clear or pale yellow.   Exercise regularly.   Go to the bathroom when you have the urge to have a bowel movement. Do not wait.   Avoid straining to have bowel movements.   Keep the anal area dry and clean. Use wet toilet paper or moist towelettes after a bowel movement.   Medicated creams and suppositories may be used or applied as directed.   Only take over-the-counter or prescription medicines as directed by your caregiver.   Take warm sitz baths for 15-20 minutes, 3-4 times a day to ease pain and discomfort.   Place ice packs on the hemorrhoids if they are tender and swollen. Using ice packs between sitz baths may be helpful.   Put ice in a plastic bag.   Place a towel between your skin and the bag.  Leave the ice on for 15-20 minutes, 3-4 times a day.   Do not use a donut-shaped pillow or sit on the toilet for long periods. This increases blood pooling and pain.  SEEK MEDICAL CARE IF:  You have increasing pain and swelling that is not controlled by treatment or medicine.  You have uncontrolled bleeding.  You have difficulty or you are unable to have a bowel movement.  You have pain or inflammation outside the area of the hemorrhoids. MAKE SURE YOU:  Understand these instructions.  Will watch your condition.  Will get help right away if you are not doing well or get worse.   This information is not intended to replace advice given to you by your health care provider. Make sure you discuss any questions you have with your health care provider.   Document Released: 04/06/2000 Document Revised: 03/26/2012 Document Reviewed: 02/12/2012 Elsevier Interactive Patient Education [employer].

## 2015-03-04 NOTE — Telephone Encounter (Signed)
ICM transmission received.  Attempted call to patient and she reported she was going to have procedure done.  Will attempt to call back another day.

## 2015-03-04 NOTE — H&P (Signed)
[name] is an 74 y.o. female.   Chief Complaint: Patient is here for EGD and colonoscopy. HPI: Patient is 74 year old African-American female with multiple medical problems who also has iron deficiency anemia and heme-positive stool. She received 2 units of PRBCs over 2 weeks ago. She denies melena or rectal bleeding nausea vomiting heartburn or dysphagia. She is on full dose aspirin but does not take other OTC NSAIDs. Last colonoscopy in 2009 was incomplete. Family history is negative for CRC.  Past Medical History  Diagnosis Date  . Arteriosclerotic cardiovascular disease (ASCVD)     Remote PTCA by patient report; LBBB; associated cardiomyopathy, presumed ischemic with EF 40-45% previously, 20% in 06/2009; h/o clinical congestive heart failure; negative stress nuclear in 2009 with inferoseptal and apical scar  . Anemia     Hgb of 9-10  . Hyperlipidemia   . Breast cancer ([place])   . Hypertension   . Automatic implantable cardioverter-defibrillator in situ   . HOH (hard of hearing)   . Anemia of chronic disease     Hgb of 9-10 chronically; 06/2010: H&H-10.7/33.5, MCV-81, normal iron studies in 2010     Past Surgical History  Procedure Laterality Date  . Total knee arthroplasty Right     Dr.Harrison  . Bi-ventricular implantable cardioverter defibrillator  (crt-d)  07/28/2012  . Cataract extraction w/ intraocular lens implant Left   . Tubal ligation    . Breast biopsy Bilateral   . Mastectomy Left 1998  . Cataract extraction w/phaco Right 09/15/2013    Procedure: CATARACT EXTRACTION PHACO AND INTRAOCULAR LENS PLACEMENT (IOC);  Surgeon: Elta Guadeloupe T. Gershon Crane, MD;  Location: [facility];  Service: Ophthalmology;  Laterality: Right;  CDE:  10.74  . Bi-ventricular implantable cardioverter defibrillator N/A 07/28/2012    Procedure: BI-VENTRICULAR IMPLANTABLE CARDIOVERTER DEFIBRILLATOR  (CRT-D);  Surgeon: Evans Lance, MD;  Location: [facility] CATH LAB;  Service: Cardiovascular;  Laterality: N/A;     Family History  Problem Relation Age of Onset  . Diabetes Father   . Cancer Father   . Hypertension Brother    Social History:  reports that she quit smoking about 7 years ago. Her smoking use included Cigarettes. She started smoking about 58 years ago. She smoked 0.25 packs per day. Her smokeless tobacco use includes Chew. She reports that she does not drink alcohol or use illicit drugs.  Allergies: No Known Allergies  Medications Prior to Admission  Medication Sig Dispense Refill  . acetaminophen (TYLENOL) 500 MG tablet Take 1,000 mg by mouth every 6 (six) hours as needed.     [name] aspirin 325 MG EC tablet Take 325 mg by mouth 2 (two) times daily before a meal.     . carvedilol (COREG) 6.25 MG tablet TAKE 1 TABLET (6.25 MG TOTAL) BY MOUTH 2 (TWO) TIMES DAILY WITH A MEAL. 60 tablet 5  . furosemide (LASIX) 40 MG tablet TAKE 1 TABLET BY MOUTH TWICE A DAY 60 tablet 6  . hydroxychloroquine (PLAQUENIL) 200 MG tablet Take 100-200 mg by mouth 2 (two) times daily. Takes a whole tablet in the morning and half a tablet in the evening .    [name] KLOR-CON M10 10 MEQ tablet TAKE 1 TABLET (10 MEQ TOTAL) BY MOUTH 2 (TWO) TIMES DAILY. 60 tablet 6  . lisinopril (PRINIVIL,ZESTRIL) 20 MG tablet Take 1 tablet (20 mg total) by mouth daily. 90 tablet 3  . lovastatin (MEVACOR) 40 MG tablet TAKE 1 TABLET (40 MG TOTAL) BY MOUTH AT BEDTIME. 30 tablet 1  .  omeprazole (PRILOSEC) 20 MG capsule Take 20 mg by mouth daily. For gas/heartburn    . PROAIR HFA 108 (90 BASE) MCG/ACT inhaler Inhale 2 puffs into the lungs every 4 (four) hours as needed for wheezing or shortness of breath.       No results found for this or any previous visit (from the past 48 hour(s)). No results found.  ROS  Blood pressure 140/71, pulse 69, temperature 97.1 F (36.2 C), temperature source Oral, resp. rate 18, height 5\' 4"  (1.626 m), weight 134 lb (60.782 kg), SpO2 100 %. Physical Exam  Constitutional: She appears well-developed and  well-nourished.  HENT:  Mouth/Throat: Oropharynx is clear and moist.  Eyes: Conjunctivae are normal. No scleral icterus.  Neck: No thyromegaly present.  Cardiovascular: Normal rate and regular rhythm.   Murmur ( grade 2/6 long systolic murmur best heard at left lower sternal border.) heard. Respiratory: Effort normal and breath sounds normal.  GI: Soft. She exhibits no distension and no mass.  Musculoskeletal: She exhibits no edema.  Lymphadenopathy:    She has no cervical adenopathy.  Neurological: She is alert.  Skin: Skin is warm and dry.     Assessment/Plan Iron deficiency anemia and heme positive stools. Diagnostic EGD and colonoscopy.  REHMAN,NAJEEB U 03/04/2015, 10:54 AM

## 2015-03-04 NOTE — Op Note (Signed)
EGD PROCEDURE REPORT  PATIENT:  [name]  MR#:  [id number] Birthdate:  05-07-1940, 74 y.o., female Endoscopist:  Dr. Rogene Houston, MD Referred By:  Dr. Molli Hazard, MD Procedure Date: 03/04/2015  Procedure:   EGD & Colonoscopy  Indications:  Patient is 74 year old African-American female with multiple medical problems including ischemic cardiomyopathy, rheumatoid arthritis who presents with iron deficiency anemia and heme positive stool. Her last colonoscopy by me in 2009 was incomplete. Family history is negative for CRC.            Informed Consent:  The risks, benefits, alternatives & imponderables which include, but are not limited to, bleeding, infection, perforation, drug reaction and potential missed lesion have been reviewed.  The potential for biopsy, lesion removal, esophageal dilation, etc. have also been discussed.  Questions have been answered.  All parties agreeable.  Please see history & physical in medical record for more information.  Medications:  Demerol 25 mg IV Versed 4 mg IV Cetacaine spray topically for oropharyngeal anesthesia  EGD  Description of procedure:  The endoscope was introduced through the mouth and advanced to the second portion of the duodenum without difficulty or limitations. The mucosal surfaces were surveyed very carefully during advancement of the scope and upon withdrawal.  Findings:  Esophagus:  Normal mucosa of esophagus. GE junction was unremarkable. GEJ:  39 cm Stomach:  Stomach was empty and distended very well with insufflation. Folds in the proximal stomach were normal. Examination of mucosa at gastric body, antrum, pyloric channel, angularis, fundus and cardia was normal. Duodenum:  Normal bulbar mucosa. Single small AV malformation noted involving mucosa of second part of the duodenum without stigmata of bleed.  Therapeutic/Diagnostic Maneuvers Performed:  None  COLONOSCOPY Description of procedure:  After a digital  rectal exam was performed, that colonoscope was advanced from the anus through the rectum and colon to the area of the cecum, ileocecal valve and appendiceal orifice. The cecum was deeply intubated. These structures were well-seen and photographed for the record. From the level of the cecum and ileocecal valve, the scope was slowly and cautiously withdrawn. The mucosal surfaces were carefully surveyed utilizing scope tip to flexion to facilitate fold flattening as needed. The scope was pulled down into the rectum where a thorough exam including retroflexion was performed.  Findings:  Prep excellent except at cecum and ascending colon may she had thick coating of stool. Cecal landmarks were well-seen. No polyps, AV malformation or other abnormalities noted. Normal rectal mucosa. Small hemorrhoids below the dentate line.  Therapeutic/Diagnostic Maneuvers Performed: None  Complications:  None  EBL: None  Cecal Withdrawal Time:  8  minutes  Impression:  EGD findings: Single small AV malformation at post bulbar duodenum without stigmata of bleed otherwise normal EGD.  Colonoscopy findings: Examination performed to cecum. Prep suboptimal at cecum and ascending colon and small polyps were AV malformations could have been missed. No abnormalities noted and rest of the colon with prep was excellent. Small external hemorrhoids.  Comments: Single small duodenal AV malformation noted. It is possible she has more vascular lesions in small bowel to account for chronic GI blood loss and iron deficiency anemia. Since patient has AICD will hold off given capsule study. If need for blood transfusion is frequent may consider given capsule study with elementary but first we'll check with Dr. Lovena Le.  Recommendations:  Standard instructions given. Patient to check with her cardiologist if aspirin dose can be reduced. Issue advised to call office if  she has melena or rectal bleeding.  REHMAN,NAJEEB U   03/04/2015 11:44 AM  CC: Dr. [name], MD & Dr. Rayne Du ref. provider found CC: Dr. Madaline Savage, MD

## 2015-03-07 NOTE — Telephone Encounter (Signed)
F/u  Pt nephew returning RN phone call- [phone]. Please call back and discuss.

## 2015-03-08 NOTE — Telephone Encounter (Signed)
Spoke with patient and nephew, [name].

## 2015-03-08 NOTE — Progress Notes (Signed)
EPIC Encounter for ICM Monitoring  Patient Name: [name] is a 74 y.o. female Date: 03/08/2015 Primary Care Physican: [name], MD Primary Cardiologist: [name] Electrophysiologist: Lovena Le Dry Weight: 133 lbs  Bi-V Pacing 93.4%       In the past month, have you:  1. Gained more than 2 pounds in a day or more than 5 pounds in a week? no  2. Had changes in your medications (with verification of current medications)? no  3. Had more shortness of breath than is usual for you? no  4. Limited your activity because of shortness of breath? no  5. Not been able to sleep because of shortness of breath? no  6. Had increased swelling in your feet or ankles? no  7. Had symptoms of dehydration (dizziness, dry mouth, increased thirst, decreased urine output) no  8. Had changes in sodium restriction? no  9. Been compliant with medication? Yes   ICM trend:  03/04/2015   Follow-up plan: ICM clinic phone appointment 04/08/2015.  Spoke with patient and she provided verbal permission to speak with nephew [name] at [phone].  Optivol thoracic impedance trending along baseline.  She denied any HF symptoms and reviewed symptoms to report.  She completed colonoscopy on 03/04/2015.  No changes today.  Spoke with nephew to provide update on fluid levels.  Advised will mail DPR form for patient to complete if she would like to add him to the list of designated parties to speak with.  Advised to mail back after completed.    Copy of note sent to patient's primary care physician, primary cardiologist, and device following physician.  [name], RN, CCM 03/08/2015 9:40 AM

## 2015-03-09 ENCOUNTER — Encounter (HOSPITAL_COMMUNITY): Payer: Self-pay | Admitting: Internal Medicine

## 2015-03-11 ENCOUNTER — Encounter (HOSPITAL_COMMUNITY): Payer: Self-pay | Admitting: Oncology

## 2015-03-11 ENCOUNTER — Encounter (HOSPITAL_COMMUNITY): Payer: Medicare Other | Attending: Hematology & Oncology | Admitting: Oncology

## 2015-03-11 ENCOUNTER — Encounter (HOSPITAL_COMMUNITY): Payer: Medicare Other

## 2015-03-11 VITALS — BP 118/50 | HR 62 | Temp 98.9°F | Resp 15 | Wt 137.6 lb

## 2015-03-11 DIAGNOSIS — D509 Iron deficiency anemia, unspecified: Secondary | ICD-10-CM

## 2015-03-11 DIAGNOSIS — M069 Rheumatoid arthritis, unspecified: Secondary | ICD-10-CM | POA: Diagnosis not present

## 2015-03-11 DIAGNOSIS — R700 Elevated erythrocyte sedimentation rate: Secondary | ICD-10-CM

## 2015-03-11 DIAGNOSIS — D638 Anemia in other chronic diseases classified elsewhere: Secondary | ICD-10-CM

## 2015-03-11 DIAGNOSIS — D649 Anemia, unspecified: Secondary | ICD-10-CM | POA: Diagnosis not present

## 2015-03-11 LAB — CBC WITH DIFFERENTIAL/PLATELET
Basophils Absolute: 0 10*3/uL (ref 0.0–0.1)
Basophils Relative: 0 %
Eosinophils Absolute: 0.1 10*3/uL (ref 0.0–0.7)
Eosinophils Relative: 2 %
HEMATOCRIT: 28 % — AB (ref 36.0–46.0)
Hemoglobin: 8.9 g/dL — ABNORMAL LOW (ref 12.0–15.0)
LYMPHS ABS: 1.4 10*3/uL (ref 0.7–4.0)
LYMPHS PCT: 26 %
MCH: 24.7 pg — AB (ref 26.0–34.0)
MCHC: 31.8 g/dL (ref 30.0–36.0)
MCV: 77.8 fL — AB (ref 78.0–100.0)
MONO ABS: 0.8 10*3/uL (ref 0.1–1.0)
MONOS PCT: 14 %
NEUTROS ABS: 3.1 10*3/uL (ref 1.7–7.7)
Neutrophils Relative %: 58 %
Platelets: 232 10*3/uL (ref 150–400)
RBC: 3.6 MIL/uL — ABNORMAL LOW (ref 3.87–5.11)
RDW: 18 % — AB (ref 11.5–15.5)
WBC: 5.4 10*3/uL (ref 4.0–10.5)

## 2015-03-11 MED ORDER — PREDNISONE 10 MG PO TABS: ORAL_TABLET | ORAL | Status: DC

## 2015-03-11 NOTE — Patient Instructions (Addendum)
[place] [facility] [place] Discharge Instructions  RECOMMENDATIONS MADE BY THE CONSULTANT AND ANY TEST RESULTS WILL BE SENT TO YOUR REFERRING PHYSICIAN.  Exam and discussion by Robynn Pane, PA-C Your hemoglobin is low today Need to get 3 stool samples - bring in when all 3 have been collected Will start you on Prednisone -   Take 4 pills daily for 3 days  Take 2 pills daily for 3 days  Then take 1 pill daily until you come back.  Follow-up in 3 week with labs and office visit.   Thank you for choosing [facility] to provide your oncology and hematology care.  To afford each patient quality time with our provider, please arrive at least 15 minutes before your scheduled appointment time.    You need to re-schedule your appointment should you arrive 10 or more minutes late.  We strive to give you quality time with our providers, and arriving late affects you and other patients whose appointments are after yours.  Also, if you no show three or more times for appointments you may be dismissed from the clinic at the providers discretion.     Again, thank you for choosing [facility].  Our hope is that these requests will decrease the amount of time that you wait before being seen by our physicians.       _____________________________________________________________  Should you have questions after your visit to [facility], please contact our office at [phone] between the hours of 8:30 a.m. and 4:30 p.m.  Voicemails left after 4:30 p.m. will not be returned until the following business day.  For prescription refill requests, have your pharmacy contact our office.    Stool for Occult Blood Test WHY AM I HAVING THIS TEST? Stool for occult blood, or fecal occult blood test (FOBT), is a test that is used to screen for gastrointestinal (GI) bleeding, which may be an indicator of colon cancer. This test can  also detect small amounts of blood in your stool (feces) from other causes, such as ulcers or hemorrhoids. This test is usually done as part of an annual routine examination after age 47. WHAT KIND OF SAMPLE IS TAKEN? A sample of your stool is required for this test. Your health care provider may collect the sample with a swab of the rectum. Or, you may be instructed to collect the sample in a container at home. If you are instructed to collect the sample, your health care provider will provide you with the instructions and the supplies that you will need to do that. WILL I NEED TO COLLECT SAMPLES AT HOME?  A stool sample may need to be collected at home. When collecting a sample at home, make sure that you:  Use the sterile containers and other supplies that were given to you from the lab.  Do not mix urine, toilet paper, or water with your sample.  Label all slides and containers with your name and the date when you collected the sample. Your health care provider or lab staff will give you one or more test "cards." You will collect a separate sample from three different stools, usually on different days that follow each other. Follow these steps for each sample: 1. Collect a stool sample into a clean container. 2. With an applicator stick, apply a thin smear of stool sample onto each filter paper square or window that is on the card.  3. Allow the filter paper to dry. After it is dry, the sample will be stable. Usually, you will return all of the samples to your health care provider or lab at the same time.  HOW DO I PREPARE FOR THE TEST?  Do not eat any red meat within three days before testing.  Follow your health care provider's instructions about eating and drinking prior to the test. Your health care provider may instruct you to avoid other foods or substances.  Ask your health care provider about taking or not taking your medicines prior to the test. You may be instructed to avoid  certain medicines that are known to interfere with this test. HOW ARE THE TEST RESULTS REPORTED? Your test results will be reported as either positive or negative. It is your responsibility to obtain your test results. Ask the lab or department performing the test when and how you will get your results. WHAT DO THE RESULTS MEAN? A negative test result means that there is no occult blood within the stool. A negative result is normal. A positive test result may mean that there is blood in the stool. Causes of blood in the stool include:  GI tumors.  Certain GI diseases.  GI trauma or recent surgery.  Hemorrhoids. Talk with your health care provider to discuss your results, treatment options, and if necessary, the need for more tests. Talk with your health care provider if you have any questions about your results.   This information is not intended to replace advice given to you by your health care provider. Make sure you discuss any questions you have with your health care provider.   Document Released: 05/04/2004 Document Revised: 04/30/2014 Document Reviewed: 09/04/2013 Elsevier Interactive Patient Education [employer].

## 2015-03-11 NOTE — Progress Notes (Addendum)
[place] [address] [place] [address]  Anemia of chronic disease - Plan: CBC with Differential, Ferritin  Iron deficiency anemia - Plan: CBC with Differential, Ferritin, Occult blood card to lab, stool, Occult blood card to lab, stool, Occult blood card to lab, stool  Rheumatoid arthritis involving multiple sites, unspecified rheumatoid factor presence (HCC) - Plan: Sedimentation rate, predniSONE (DELTASONE) 10 MG tablet, DISCONTINUED: predniSONE (DELTASONE) 10 MG tablet  Elevated sed rate - Plan: predniSONE (DELTASONE) 10 MG tablet, DISCONTINUED: predniSONE (DELTASONE) 10 MG tablet  CURRENT THERAPY: S/P 2 units of PRBCs on 02/04/2015  INTERVAL HISTORY: [name] 74 y.o. female returns for followup iron deficiency anemia in the setting of Stage II chronic renal disease, requiring infrequent IV replacement, and rheumatoid arthritis, on Plaquenil.  S/P EGD/colonoscopy by Dr. Laural Golden on 03/04/2015 demonstrating a small AVM in duodenum, sub-optimal prep on colonoscopy with small polyps which could have hindered the ability to identify AVMs. No camera capsule study at this time. AND Distant history of left breast cancer in 1992 treated with radial mastectomy followed by 5 years worth of tamoxifen without XRT.  On her last encounter, her severe anemia of noted at 6.8 g/dL.  She was transfused as an outpatient the following day after refusing hospitalization.  She was referred to GI for work-up of severe anemia.  She has been seen by Dr. Laural Golden and Deberah Castle.  She is S/P EGD and colonoscopy on 03/04/2015.  Findings follow: EGD findings: Single small AV malformation at post bulbar duodenum without stigmata of bleed otherwise normal EGD.  Colonoscopy findings: Examination performed to cecum. Prep suboptimal at cecum and ascending colon and small polyps were AV malformations could have been missed. No abnormalities noted and rest of the colon with prep  was excellent. Small external hemorrhoids.  Comments: Single small duodenal AV malformation noted. It is possible she has more vascular lesions in small bowel to account for chronic GI blood loss and iron deficiency anemia. Since patient has AICD will hold off given capsule study. If need for blood transfusion is frequent may consider given capsule study with elementary but first we'll check with Dr. Lovena Le.   I personally reviewed and went over laboratory results with the patient.  The results are noted within this dictation. Labs will be updated today.  HGB drop is noted.  Elevated ESR is appreciated too.  She denies any complaints.  She notes some good day and some bad days.   She reports that she wores shoes to a funeral recently that did not fit her right.  As a result, after wearing them, she developed 2 medial, right blisters.  I suspect this is secondary to trauma from her shoes.  She denies any complaints and ROS questioning is negative.  Past Medical History  Diagnosis Date  . Arteriosclerotic cardiovascular disease (ASCVD)     Remote PTCA by patient report; LBBB; associated cardiomyopathy, presumed ischemic with EF 40-45% previously, 20% in 06/2009; h/o clinical congestive heart failure; negative stress nuclear in 2009 with inferoseptal and apical scar  . Anemia     Hgb of 9-10  . Hyperlipidemia   . Breast cancer ([place])   . Hypertension   . Automatic implantable cardioverter-defibrillator in situ   . HOH (hard of hearing)   . Anemia of chronic disease     Hgb of 9-10 chronically; 06/2010: H&H-10.7/33.5, MCV-81, normal iron studies in 2010     has Dyslipidemia; Chronic systolic heart  failure ([place]); Anemia of chronic disease; Breast cancer ([place]); Hypertension; Biventricular ICD in place (MDT 2014); Hypokalemia; Pain and swelling of wrist; Chest pain; Rheumatoid arthritis ([place]); HCAP (healthcare-associated pneumonia); Sepsis ([place]); ARF (acute renal failure) ([place]); PNA  (pneumonia); Malnutrition of moderate degree ([place]); Iron deficiency anemia; CHF exacerbation ([place]); Acute on chronic combined systolic and diastolic congestive heart failure ([place]); CAD S/P remote PCI- no details; Cardiomyopathy, ischemic-EF 30-35% March 8099; and Diastolic dysfunction-grade 2 on her problem list.     has No Known Allergies.  Current Outpatient Prescriptions on File Prior to Visit  Medication Sig Dispense Refill  . acetaminophen (TYLENOL) 500 MG tablet Take 1,000 mg by mouth every 6 (six) hours as needed.     . carvedilol (COREG) 6.25 MG tablet TAKE 1 TABLET (6.25 MG TOTAL) BY MOUTH 2 (TWO) TIMES DAILY WITH A MEAL. 60 tablet 5  . furosemide (LASIX) 40 MG tablet TAKE 1 TABLET BY MOUTH TWICE A DAY 60 tablet 6  . hydroxychloroquine (PLAQUENIL) 200 MG tablet Take 100-200 mg by mouth 2 (two) times daily. Takes a whole tablet in the morning and half a tablet in the evening .    [name] KLOR-CON M10 10 MEQ tablet TAKE 1 TABLET (10 MEQ TOTAL) BY MOUTH 2 (TWO) TIMES DAILY. 60 tablet 6  . lisinopril (PRINIVIL,ZESTRIL) 20 MG tablet Take 1 tablet (20 mg total) by mouth daily. 90 tablet 3  . lovastatin (MEVACOR) 40 MG tablet TAKE 1 TABLET (40 MG TOTAL) BY MOUTH AT BEDTIME. 30 tablet 1  . omeprazole (PRILOSEC) 20 MG capsule Take 20 mg by mouth daily. For gas/heartburn    . aspirin 325 MG EC tablet Take 325 mg by mouth 2 (two) times daily before a meal.     . PROAIR HFA 108 (90 BASE) MCG/ACT inhaler Inhale 2 puffs into the lungs every 4 (four) hours as needed for wheezing or shortness of breath.      No current facility-administered medications on file prior to visit.    Past Surgical History  Procedure Laterality Date  . Total knee arthroplasty Right     Dr.Harrison  . Bi-ventricular implantable cardioverter defibrillator  (crt-d)  07/28/2012  . Cataract extraction w/ intraocular lens implant Left   . Tubal ligation    . Breast biopsy Bilateral   . Mastectomy Left 1998  . Cataract extraction  w/phaco Right 09/15/2013    Procedure: CATARACT EXTRACTION PHACO AND INTRAOCULAR LENS PLACEMENT (IOC);  Surgeon: Elta Guadeloupe T. Gershon Crane, MD;  Location: [facility];  Service: Ophthalmology;  Laterality: Right;  CDE:  10.74  . Bi-ventricular implantable cardioverter defibrillator N/A 07/28/2012    Procedure: BI-VENTRICULAR IMPLANTABLE CARDIOVERTER DEFIBRILLATOR  (CRT-D);  Surgeon: Evans Lance, MD;  Location: [facility] CATH LAB;  Service: Cardiovascular;  Laterality: N/A;  . Colonoscopy N/A 03/04/2015    Procedure: COLONOSCOPY;  Surgeon: Rogene Houston, MD;  Location: [facility] SUITE;  Service: Endoscopy;  Laterality: N/A;  10:50   . Esophagogastroduodenoscopy N/A 03/04/2015    Procedure: ESOPHAGOGASTRODUODENOSCOPY (EGD);  Surgeon: Rogene Houston, MD;  Location: [facility] SU[facility];  Service: Endoscopy;  Laterality: N/A;    Denies any headaches, dizziness, double vision, fevers, chills, night sweats, nausea, vomiting, diarrhea, constipation, chest pain, heart palpitations, shortness of breath, blood in stool, black tarry stool, urinary pain, urinary burning, urinary frequency, hematuria.   PHYSICAL EXAMINATION  ECOG PERFORMANCE STATUS: 1 - Symptomatic but completely ambulatory  Filed Vitals:   03/11/15 1344  BP: 118/50  Pulse: 62  Temp: 98.9 F (  37.2 C)  Resp: 15    GENERAL:alert, no distress, comfortable, cooperative, smiling and unaccompanied SKIN: skin color, texture, turgor are normal, no rashes or significant lesions HEAD: Normocephalic, No masses, lesions, tenderness or abnormalities EYES: normal, PERRLA, EOMI, Conjunctiva are pink and non-injected EARS: External ears normal OROPHARYNX:lips, buccal mucosa, and tongue normal and mucous membranes are moist  NECK: supple, no adenopathy, trachea midline LYMPH:  no palpable lymphadenopathy BREAST:not examined LUNGS: clear to auscultation  HEART: regular rate & rhythm ABDOMEN:abdomen soft and normal bowel sounds BACK: Back symmetric, no  curvature. EXTREMITIES:less then 2 second capillary refill, no joint deformities, effusion, or inflammation, no skin discoloration, no cyanosis, right medial ankle blister with a healed anterior blister.  NEURO: alert & oriented x 3 with fluent speech, no focal motor/sensory deficits, gait normal   LABORATORY DATA: CBC    Component Value Date/Time   WBC 5.4 03/11/2015 1350   RBC 3.60* 03/11/2015 1350   RBC 4.30 02/17/2015 1512   HGB 8.9* 03/11/2015 1350   HCT 28.0* 03/11/2015 1350   PLT 232 03/11/2015 1350   MCV 77.8* 03/11/2015 1350   MCH 24.7* 03/11/2015 1350   MCHC 31.8 03/11/2015 1350   RDW 18.0* 03/11/2015 1350   LYMPHSABS 1.4 03/11/2015 1350   MONOABS 0.8 03/11/2015 1350   EOSABS 0.1 03/11/2015 1350   BASOSABS 0.0 03/11/2015 1350      Chemistry      Component Value Date/Time   NA 138 02/03/2015 1027   K 4.2 02/03/2015 1027   CL 102 02/03/2015 1027   CO2 27 02/03/2015 1027   BUN 19 02/03/2015 1027   CREATININE 1.09* 02/03/2015 1027   CREATININE 1.23* 09/17/2014 1347      Component Value Date/Time   CALCIUM 8.9 02/03/2015 1027   ALKPHOS 93 02/03/2015 1027   AST 18 02/03/2015 1027   ALT 11* 02/03/2015 1027   BILITOT 0.3 02/03/2015 1027      Lab Results  Component Value Date   IRON 31 02/17/2015   TIBC 185* 02/17/2015   FERRITIN 244 02/17/2015     PENDING LABS:   RADIOGRAPHIC STUDIES:  Mm Screening Breast Tomo Uni R  03/03/2015  CLINICAL DATA:  Screening. EXAM: DIGITAL SCREENING UNILATERAL RIGHT MAMMOGRAM WITH TOMO AND CAD COMPARISON:  Previous exam(s). ACR Breast Density Category b: There are scattered areas of fibroglandular density. FINDINGS: The patient has had a left mastectomy. There are no findings suspicious for malignancy. Images were processed with CAD. IMPRESSION: No mammographic evidence of malignancy. A result letter of this screening mammogram will be mailed directly to the patient. RECOMMENDATION: Screening mammogram in one year.   ([id number]) BI-RADS CATEGORY  1: Negative. Electronically Signed   By: Claudie Revering M.D.   On: 03/03/2015 17:37     PATHOLOGY:    ASSESSMENT AND PLAN:  Anemia of chronic disease Iron deficiency anemia in the setting of Stage II chronic renal disease, requiring infrequent IV replacement, and rheumatoid arthritis, on Plaquenil.  S/P EGD/colonoscopy by Dr. Laural Golden on 03/04/2015 demonstrating a small AVM in duodenum, sub-optimal prep on colonoscopy with small polyps which could have hindered the ability to identify AVMs. No camera capsule study at this time.  She is S/P 2 units of PRBCs on 02/04/2015 due to an anemia of 6.8 g/dL.  EGD/Colonosocpy report reviewed.  Labs today: CBC diff.  HGB drop is noted.  I will send her home with 3 stool cards to evaluate for occult blood loss.  Additionally, elevated ESR is appreciated.  This  is likely contributing to her anemia.  Will try Prednisone 40 mg x 3 days, then 20 mg x 3 days, then 10 mg daily.  Will re-evaluate future need at return appointment.  Labs in 3 weeks: CBC diff, ferritin, ESR  Return in 3 weeks for follow-up.  Rheumatoid arthritis She is on Plaquenil daily.  Nursing reviewed her dose of Plaquenil with the patient.  Previously, patient admits that she is to be taking 1 tablet in AM and 1/2 tablet in PM.  For several months, possibly back as far as July 2016, she has been taking 1 tablet in AM and 1 tablet in PM because "I can't cut the pills in half.  I have a pill cutter, but my hands are too weak and painful to cut the pill." [facility] was contacted regarding this issue.  They can split the pills for her at her request.  The patient is advised of this.  However, [facility] pharmacist reports that she is on time for her Rx refills of plaquenil and only get monthly supply.      THERAPY PLAN:  Continue surveillance of labs.  All questions were answered. The patient knows to call the clinic with any problems, questions or concerns. We can  certainly see the patient much sooner if necessary.  Patient and plan discussed with Dr. Ancil Linsey and she is in agreement with the aforementioned.   This note is electronically signed by: Doy Mince 03/11/2015 8:32 PM

## 2015-03-11 NOTE — Assessment & Plan Note (Signed)
She is on Plaquenil daily.  Nursing reviewed her dose of Plaquenil with the patient.  Previously, patient admits that she is to be taking 1 tablet in AM and 1/2 tablet in PM.  For several months, possibly back as far as July 2016, she has been taking 1 tablet in AM and 1 tablet in PM because "I can't cut the pills in half.  I have a pill cutter, but my hands are too weak and painful to cut the pill." [facility] was contacted regarding this issue.  They can split the pills for her at her request.  The patient is advised of this.  However, [facility] pharmacist reports that she is on time for her Rx refills of plaquenil and only get monthly supply.

## 2015-03-11 NOTE — Assessment & Plan Note (Addendum)
Iron deficiency anemia in the setting of Stage II chronic renal disease, requiring infrequent IV replacement, and rheumatoid arthritis, on Plaquenil.  S/P EGD/colonoscopy by Dr. Laural Golden on 03/04/2015 demonstrating a small AVM in duodenum, sub-optimal prep on colonoscopy with small polyps which could have hindered the ability to identify AVMs. No camera capsule study at this time.  She is S/P 2 units of PRBCs on 02/04/2015 due to an anemia of 6.8 g/dL.  EGD/Colonosocpy report reviewed.  Labs today: CBC diff.  HGB drop is noted.  I will send her home with 3 stool cards to evaluate for occult blood loss.  Additionally, elevated ESR is appreciated.  This is likely contributing to her anemia.  Will try Prednisone 40 mg x 3 days, then 20 mg x 3 days, then 10 mg daily.  Will re-evaluate future need at return appointment.  Labs in 3 weeks: CBC diff, ferritin, ESR  Return in 3 weeks for follow-up.

## 2015-03-21 ENCOUNTER — Other Ambulatory Visit (HOSPITAL_COMMUNITY): Payer: Self-pay

## 2015-03-21 DIAGNOSIS — D509 Iron deficiency anemia, unspecified: Secondary | ICD-10-CM

## 2015-03-21 DIAGNOSIS — D649 Anemia, unspecified: Secondary | ICD-10-CM | POA: Diagnosis not present

## 2015-03-21 LAB — OCCULT BLOOD X 1 CARD TO LAB, STOOL: Fecal Occult Bld: POSITIVE — AB

## 2015-03-31 ENCOUNTER — Encounter (HOSPITAL_BASED_OUTPATIENT_CLINIC_OR_DEPARTMENT_OTHER): Payer: Medicare Other | Admitting: Hematology & Oncology

## 2015-03-31 ENCOUNTER — Encounter (HOSPITAL_COMMUNITY): Payer: Medicare Other | Attending: Hematology & Oncology

## 2015-03-31 ENCOUNTER — Encounter (HOSPITAL_COMMUNITY): Payer: Self-pay | Admitting: Hematology & Oncology

## 2015-03-31 ENCOUNTER — Ambulatory Visit (HOSPITAL_COMMUNITY): Payer: Medicare Other | Admitting: Hematology & Oncology

## 2015-03-31 VITALS — BP 116/43 | HR 75 | Temp 98.5°F | Resp 20 | Wt 141.2 lb

## 2015-03-31 DIAGNOSIS — M069 Rheumatoid arthritis, unspecified: Secondary | ICD-10-CM

## 2015-03-31 DIAGNOSIS — N182 Chronic kidney disease, stage 2 (mild): Secondary | ICD-10-CM | POA: Diagnosis not present

## 2015-03-31 DIAGNOSIS — D649 Anemia, unspecified: Secondary | ICD-10-CM

## 2015-03-31 DIAGNOSIS — D509 Iron deficiency anemia, unspecified: Secondary | ICD-10-CM

## 2015-03-31 DIAGNOSIS — D638 Anemia in other chronic diseases classified elsewhere: Secondary | ICD-10-CM

## 2015-03-31 DIAGNOSIS — E611 Iron deficiency: Secondary | ICD-10-CM | POA: Diagnosis not present

## 2015-03-31 LAB — CBC WITH DIFFERENTIAL/PLATELET
BASOS PCT: 0 %
Basophils Absolute: 0 10*3/uL (ref 0.0–0.1)
EOS ABS: 0.1 10*3/uL (ref 0.0–0.7)
EOS PCT: 1 %
HCT: 33.6 % — ABNORMAL LOW (ref 36.0–46.0)
Hemoglobin: 10.7 g/dL — ABNORMAL LOW (ref 12.0–15.0)
Lymphocytes Relative: 41 %
Lymphs Abs: 3.4 10*3/uL (ref 0.7–4.0)
MCH: 25.5 pg — AB (ref 26.0–34.0)
MCHC: 31.8 g/dL (ref 30.0–36.0)
MCV: 80.2 fL (ref 78.0–100.0)
MONO ABS: 0.8 10*3/uL (ref 0.1–1.0)
MONOS PCT: 9 %
NEUTROS PCT: 49 %
Neutro Abs: 4.2 10*3/uL (ref 1.7–7.7)
PLATELETS: 182 10*3/uL (ref 150–400)
RBC: 4.19 MIL/uL (ref 3.87–5.11)
RDW: 20.6 % — AB (ref 11.5–15.5)
WBC: 8.4 10*3/uL (ref 4.0–10.5)

## 2015-03-31 LAB — FERRITIN: FERRITIN: 356 ng/mL — AB (ref 11–307)

## 2015-03-31 LAB — SEDIMENTATION RATE: SED RATE: 108 mm/h — AB (ref 0–22)

## 2015-03-31 NOTE — Progress Notes (Signed)
LABS DRAWN

## 2015-03-31 NOTE — Patient Instructions (Signed)
[place] at [facility] Discharge Instructions  RECOMMENDATIONS MADE BY THE CONSULTANT AND ANY TEST RESULTS WILL BE SENT TO YOUR REFERRING PHYSICIAN.    Exam completed by Dr Whitney Muse today Lab work today Wait for the results If you ever see any blood in your stool please call us or go to the ER Return to see the doctor monthly with lab work Please call the clinic if you have any questions or concerns     Thank you for choosing [place] [facility] to provide your oncology and hematology care.  To afford each patient quality time with our provider, please arrive at least 15 minutes before your scheduled appointment time.    You need to re-schedule your appointment should you arrive 10 or more minutes late.  We strive to give you quality time with our providers, and arriving late affects you and other patients whose appointments are after yours.  Also, if you no show three or more times for appointments you may be dismissed from the clinic at the providers discretion.     Again, thank you for choosing [facility].  Our hope is that these requests will decrease the amount of time that you wait before being seen by our physicians.       _____________________________________________________________  Should you have questions after your visit to [facility], please contact our office at [phone] between the hours of 8:30 a.m. and 4:30 p.m.  Voicemails left after 4:30 p.m. will not be returned until the following business day.  For prescription refill requests, have your pharmacy contact our office.

## 2015-03-31 NOTE — Progress Notes (Signed)
[place], MD [address] [place] [address]    DIAGNOSIS: Iron deficiency anemia, microcytic anemia with hgb 8.5, mcv 70 on 09/02/2013 Carcinoma of the Left breast diagnosed in 1992, modified radical mastectomy, 5 years tamoxifen, no XRT CKD, stage II mild EGD and colonoscopy 11/112016 Dr. Laural Golden, single small AVM at post bulbar duodenum, exam performed to cecum with suboptimal prep at cecum and ascending colon AICD   CURRENT THERAPY: IV iron, prn PRBC's  INTERVAL HISTORY: [name] 74 y.o. female returns for follow-up of a history of carcinoma the left breast diagnosed back in 1992. She also has an anemia for which she has received IV iron. In spite of IV iron replacement she has remained anemic with her last hemoglobin at 9.4. At presentation in early 2015 she had a microcytic anemia and documented iron deficiency. She complains of fatigue but otherwise has no other major concerns.   She has had worsening anemia recently requiring transfusion. She did have a GI evaluation that showed an AVM.  [name] is here alone today and reports she has been feeling a whole lot better. She experiences pain in her hands and feet sometimes from the rheumatoid arthritis. Denies blood in stool.  MEDICAL HISTORY: Past Medical History  Diagnosis Date  . Arteriosclerotic cardiovascular disease (ASCVD)     Remote PTCA by patient report; LBBB; associated cardiomyopathy, presumed ischemic with EF 40-45% previously, 20% in 06/2009; h/o clinical congestive heart failure; negative stress nuclear in 2009 with inferoseptal and apical scar  . Anemia     Hgb of 9-10  . Hyperlipidemia   . Breast cancer ([place])   . Hypertension   . Automatic implantable cardioverter-defibrillator in situ   . HOH (hard of hearing)   . Anemia of chronic disease     Hgb of 9-10 chronically; 06/2010: H&H-10.7/33.5, MCV-81, normal iron studies in 2010     has Dyslipidemia; Chronic systolic heart failure  ([place]); Anemia of chronic disease; Breast cancer ([place]); Hypertension; Biventricular ICD in place (MDT 2014); Hypokalemia; Pain and swelling of wrist; Chest pain; Rheumatoid arthritis ([place]); HCAP (healthcare-associated pneumonia); Sepsis ([place]); ARF (acute renal failure) ([place]); PNA (pneumonia); Malnutrition of moderate degree ([place]); Iron deficiency anemia; CHF exacerbation ([place]); Acute on chronic combined systolic and diastolic congestive heart failure ([place]); CAD S/P remote PCI- no details; Cardiomyopathy, ischemic-EF 30-35% March 5397; and Diastolic dysfunction-grade 2 on her problem list.     has No Known Allergies.  [name] does not currently have medications on file.  SURGICAL HISTORY: Past Surgical History  Procedure Laterality Date  . Total knee arthroplasty Right     Dr.Harrison  . Bi-ventricular implantable cardioverter defibrillator  (crt-d)  07/28/2012  . Cataract extraction w/ intraocular lens implant Left   . Tubal ligation    . Breast biopsy Bilateral   . Mastectomy Left 1998  . Cataract extraction w/phaco Right 09/15/2013    Procedure: CATARACT EXTRACTION PHACO AND INTRAOCULAR LENS PLACEMENT (IOC);  Surgeon: Elta Guadeloupe T. Gershon Crane, MD;  Location: [facility];  Service: Ophthalmology;  Laterality: Right;  CDE:  10.74  . Bi-ventricular implantable cardioverter defibrillator N/A 07/28/2012    Procedure: BI-VENTRICULAR IMPLANTABLE CARDIOVERTER DEFIBRILLATOR  (CRT-D);  Surgeon: Evans Lance, MD;  Location: [facility] CATH LAB;  Service: Cardiovascular;  Laterality: N/A;  . Colonoscopy N/A 03/04/2015    Procedure: COLONOSCOPY;  Surgeon: Rogene Houston, MD;  Location: [facility];  Service: Endoscopy;  Laterality: N/A;  10:50   . Esophagogastroduodenoscopy N/A 03/04/2015  Procedure: ESOPHAGOGASTRODUODENOSCOPY (EGD);  Surgeon: Rogene Houston, MD;  Location: [facility];  Service: Endoscopy;  Laterality: N/A;    SOCIAL HISTORY: Social History   Social History  . Marital Status: Married     Spouse Name: N/A  . Number of Children: N/A  . Years of Education: N/A   Occupational History  . Retired    Social History Main Topics  . Smoking status: Former Smoker -- 0.25 packs/day    Types: Cigarettes    Start date: 09/17/1956    Quit date: 04/24/2007  . Smokeless tobacco: Current User    Types: Chew     Comment: 07/03/2013 "smoked some; don't know how much or how long or when I quit"  . Alcohol Use: No  . Drug Use: No  . Sexual Activity: Not Currently   Other Topics Concern  . Not on file   Social History Narrative    FAMILY HISTORY: Family History  Problem Relation Age of Onset  . Diabetes Father   . Cancer Father   . Hypertension Brother     Review of Systems  Constitutional: Negative for malaise/fatigue, fever, chills and weight loss.  HENT: Negative for congestion, hearing loss, nosebleeds, sore throat and tinnitus.   Eyes: Negative for blurred vision, double vision, pain and discharge.  Respiratory: Negative for cough, hemoptysis, sputum production, shortness of breath and wheezing.   Cardiovascular: Negative for chest pain, palpitations, claudication, leg swelling and PND.  Gastrointestinal: Negative for heartburn, nausea, vomiting, abdominal pain, diarrhea, constipation, blood in stool and melena.  Genitourinary: Negative for dysuria, urgency, frequency and hematuria.  Musculoskeletal: Positive for joint pain. Negative for myalgias and falls.     Rheumatoid arthritis Skin: Negative for itching and rash.  Neurological: Negative for dizziness, tingling, tremors, sensory change, speech change, focal weakness, seizures, loss of consciousness, weakness and headaches.  Endo/Heme/Allergies: Does not bruise/bleed easily.  Psychiatric/Behavioral: Negative for depression, suicidal ideas, memory loss and substance abuse. The patient is not nervous/anxious and does not have insomnia.   14 point review of systems was performed and is negative except as detailed under  history of present illness and above  PHYSICAL EXAMINATION  ECOG PERFORMANCE STATUS: 1 - Symptomatic but completely ambulatory  Filed Vitals:   03/31/15 0830  BP: 116/43  Pulse: 75  Temp: 98.5 F (36.9 C)  Resp: 20    Physical Exam  Constitutional: She is oriented to person, place, and time and well-developed, well-nourished, and in no distress.  HENT:  Head: Normocephalic and atraumatic.  Nose: Nose normal.  Mouth/Throat: Oropharynx is clear and moist. No oropharyngeal exudate.  Eyes: Conjunctivae and EOM are normal. Pupils are equal, round, and reactive to light. Right eye exhibits no discharge. Left eye exhibits no discharge. No scleral icterus.  Neck: Normal range of motion. Neck supple. No tracheal deviation present. No thyromegaly present.  Cardiovascular: Normal rate, regular rhythm and normal heart sounds.  Exam reveals no gallop and no friction rub.   No murmur heard. Pulmonary/Chest: Effort normal and breath sounds normal. She has no wheezes. She has no rales.  Abdominal: Soft. Bowel sounds are normal. She exhibits no distension and no mass. There is no tenderness. There is no rebound and no guarding.  Musculoskeletal: Normal range of motion. She exhibits no edema.  Lymphadenopathy:    She has no cervical adenopathy.  Neurological: She is alert and oriented to person, place, and time. She has normal reflexes. No cranial nerve deficit. Gait normal. Coordination normal.  Skin:  Skin is warm and dry. No rash noted.  Psychiatric: Mood, memory, affect and judgment normal.  Nursing note and vitals reviewed.   LABORATORY DATA: I have reviewed the data below as listed. CBC    Component Value Date/Time   WBC 8.4 03/31/2015 0853   RBC 4.19 03/31/2015 0853   RBC 4.30 02/17/2015 1512   HGB 10.7* 03/31/2015 0853   HCT 33.6* 03/31/2015 0853   PLT 182 03/31/2015 0853   MCV 80.2 03/31/2015 0853   MCH 25.5* 03/31/2015 0853   MCHC 31.8 03/31/2015 0853   RDW 20.6* 03/31/2015  0853   LYMPHSABS 3.4 03/31/2015 0853   MONOABS 0.8 03/31/2015 0853   EOSABS 0.1 03/31/2015 0853   BASOSABS 0.0 03/31/2015 0853   CMP     Component Value Date/Time   NA 138 02/03/2015 1027   K 4.2 02/03/2015 1027   CL 102 02/03/2015 1027   CO2 27 02/03/2015 1027   GLUCOSE 101* 02/03/2015 1027   BUN 19 02/03/2015 1027   CREATININE 1.09* 02/03/2015 1027   CREATININE 1.23* 09/17/2014 1347   CALCIUM 8.9 02/03/2015 1027   PROT 8.2* 02/03/2015 1027   ALBUMIN 2.6* 02/03/2015 1027   AST 18 02/03/2015 1027   ALT 11* 02/03/2015 1027   ALKPHOS 93 02/03/2015 1027   BILITOT 0.3 02/03/2015 1027   GFRNONAA 48* 02/03/2015 1027   GFRAA 56* 02/03/2015 1027       ASSESSMENT and THERAPY PLAN:  Anemia History of iron deficiency, currently normal iron studies CAD, ICD, chronic LBBB CKD, stage II Normal B12 and folate 2/2/106 History Elevated ESR and CRP 05/2014 Normal SPEP/IEP in 2010 EGD and colonoscopy 11/112016 Dr. Laural Golden, single small AVM at post bulbar duodenum, exam performed to cecum with suboptimal prep at cecum and ascending colon AICD  The cause of her anemia is most likely multifactorial including CKD, chronic GI related blood loss and resultant iron deficiency. She is currently agreeable to close observation of counts with iron or PRBC's as needed.  She has no history of a bone marrow biopsy and this may need to be considered as well. She does have a history of significantly elevated inflammatory markers.  She understands to let us know if she notices any blood in her stool.  She will continue to follow-up monthly with labs.   All questions were answered. The patient knows to call the clinic with any problems, questions or concerns. We can certainly see the patient much sooner if necessary.   This document serves as a record of services personally performed by Ancil Linsey, MD. It was created on her behalf by Arlyce Harman, a trained medical scribe. The creation of this  record is based on the scribe's personal observations and the provider's statements to them. This document has been checked and approved by the attending provider.  I have reviewed the above documentation for accuracy and completeness, and I agree with the above.  This note was electronically signed.  Kelby Fam. Whitney Muse, MD

## 2015-04-01 ENCOUNTER — Encounter (HOSPITAL_COMMUNITY): Payer: Self-pay | Admitting: Hematology & Oncology

## 2015-04-01 LAB — RHEUMATOID FACTOR

## 2015-04-01 LAB — CYCLIC CITRUL PEPTIDE ANTIBODY, IGG/IGA: CCP ANTIBODIES IGG/IGA: 208 U — AB (ref 0–19)

## 2015-04-08 ENCOUNTER — Ambulatory Visit (INDEPENDENT_AMBULATORY_CARE_PROVIDER_SITE_OTHER): Payer: Medicare Other | Admitting: *Deleted

## 2015-04-08 DIAGNOSIS — Z95810 Presence of automatic (implantable) cardiac defibrillator: Secondary | ICD-10-CM | POA: Diagnosis not present

## 2015-04-08 DIAGNOSIS — I5043 Acute on chronic combined systolic (congestive) and diastolic (congestive) heart failure: Secondary | ICD-10-CM

## 2015-04-08 DIAGNOSIS — I255 Ischemic cardiomyopathy: Secondary | ICD-10-CM | POA: Diagnosis not present

## 2015-04-08 NOTE — Progress Notes (Signed)
Remote ICD transmission.   

## 2015-04-09 ENCOUNTER — Other Ambulatory Visit: Payer: Self-pay | Admitting: Internal Medicine

## 2015-04-11 ENCOUNTER — Encounter (HOSPITAL_COMMUNITY): Payer: Self-pay | Admitting: Lab

## 2015-04-11 LAB — CUP PACEART REMOTE DEVICE CHECK
Battery Remaining Longevity: 85 mo
Battery Voltage: 2.99 V
Brady Statistic AP VP Percent: 17.86 %
Brady Statistic RV Percent Paced: 4.56 %
Date Time Interrogation Session: 20161216093827
HighPow Impedance: 86 Ohm
Implantable Lead Implant Date: 20140407
Implantable Lead Implant Date: 20140407
Implantable Lead Location: 753858
Implantable Lead Location: 753860
Implantable Lead Model: 4194
Implantable Lead Model: 6935
Lead Channel Impedance Value: 703 Ohm
Lead Channel Impedance Value: 893 Ohm
Lead Channel Pacing Threshold Amplitude: 1.125 V
Lead Channel Pacing Threshold Pulse Width: 0.4 ms
Lead Channel Sensing Intrinsic Amplitude: 2.875 mV
Lead Channel Sensing Intrinsic Amplitude: 2.875 mV
Lead Channel Sensing Intrinsic Amplitude: 31.625 mV
Lead Channel Setting Pacing Amplitude: 1.5 V
MDC IDC LEAD IMPLANT DT: 20140407
MDC IDC LEAD LOCATION: 753859
MDC IDC MSMT LEADCHNL LV IMPEDANCE VALUE: 323 Ohm
MDC IDC MSMT LEADCHNL LV PACING THRESHOLD AMPLITUDE: 0.5 V
MDC IDC MSMT LEADCHNL LV PACING THRESHOLD PULSEWIDTH: 0.5 ms
MDC IDC MSMT LEADCHNL RA IMPEDANCE VALUE: 399 Ohm
MDC IDC MSMT LEADCHNL RA PACING THRESHOLD AMPLITUDE: 0.375 V
MDC IDC MSMT LEADCHNL RV IMPEDANCE VALUE: 399 Ohm
MDC IDC MSMT LEADCHNL RV IMPEDANCE VALUE: 513 Ohm
MDC IDC MSMT LEADCHNL RV PACING THRESHOLD PULSEWIDTH: 0.4 ms
MDC IDC MSMT LEADCHNL RV SENSING INTR AMPL: 31.625 mV
MDC IDC SET LEADCHNL LV PACING PULSEWIDTH: 0.5 ms
MDC IDC SET LEADCHNL RA PACING AMPLITUDE: 2 V
MDC IDC SET LEADCHNL RV SENSING SENSITIVITY: 0.3 mV
MDC IDC STAT BRADY AP VS PERCENT: 1.37 %
MDC IDC STAT BRADY AS VP PERCENT: 78.39 %
MDC IDC STAT BRADY AS VS PERCENT: 2.38 %
MDC IDC STAT BRADY RA PERCENT PACED: 19.23 %

## 2015-04-11 NOTE — Progress Notes (Signed)
Referral sent to Dr Lenna Gilford rheumatologist.  Records faxed on 12/19

## 2015-04-12 ENCOUNTER — Telehealth: Payer: Self-pay

## 2015-04-12 NOTE — Telephone Encounter (Signed)
ICM transmission received.  Attempted call to nephew [name] at [phone] and left message for return call.

## 2015-04-13 ENCOUNTER — Encounter: Payer: Self-pay | Admitting: Cardiology

## 2015-04-13 NOTE — Progress Notes (Addendum)
EPIC Encounter for ICM Monitoring  Patient Name: [name] is a 74 y.o. female Date: 04/13/2015 Primary Care Physican: [name], MD Primary Cardiologist: [name] Electrophysiologist: Lovena Le Dry Weight: 133 lbs       Bi-V Pacing 88.6% (was 93.4% on 03/04/2015)  In the past month, have you:  1. Gained more than 2 pounds in a day or more than 5 pounds in a week? no  2. Had changes in your medications (with verification of current medications)? no  3. Had more shortness of breath than is usual for you? no  4. Limited your activity because of shortness of breath? no  5. Not been able to sleep because of shortness of breath? no  6. Had increased swelling in your feet or ankles? no  7. Had symptoms of dehydration (dizziness, dry mouth, increased thirst, decreased urine output) no  8. Had changes in sodium restriction? no  9. Been compliant with medication? Yes   ICM trend: 04/08/2015    Follow-up plan: ICM clinic phone appointment 05/16/2015.  Spoke with nephew [name].   Optivol thoracic impedance below baseline ~03/11/2015 to 03/20/2015 suggesting fluid retention.  He denied patient has had any HF symptoms and stated the only thing bothering her is the arthritis.  She has a referral to rheumatologist.  He stated the fluid may have been result of foods that were eaten over the holiday.  Reminded him that she stay on low salt diet.  No changes today.  DPR on file to speak with [name].   Copy of note sent to patient's primary care physician, primary cardiologist, and device following physician.  [name], RN, CCM 04/13/2015 9:56 AM

## 2015-04-13 NOTE — Telephone Encounter (Signed)
Spoke with nephew [name]

## 2015-05-02 ENCOUNTER — Encounter (HOSPITAL_COMMUNITY): Payer: Self-pay | Admitting: Hematology & Oncology

## 2015-05-02 ENCOUNTER — Encounter (HOSPITAL_COMMUNITY): Payer: Medicare Other | Attending: Hematology & Oncology | Admitting: Hematology & Oncology

## 2015-05-02 ENCOUNTER — Encounter (HOSPITAL_COMMUNITY): Payer: Medicare Other

## 2015-05-02 VITALS — BP 120/44 | HR 84 | Temp 98.5°F | Resp 18 | Wt 152.0 lb

## 2015-05-02 DIAGNOSIS — N182 Chronic kidney disease, stage 2 (mild): Secondary | ICD-10-CM

## 2015-05-02 DIAGNOSIS — M069 Rheumatoid arthritis, unspecified: Secondary | ICD-10-CM

## 2015-05-02 DIAGNOSIS — D649 Anemia, unspecified: Secondary | ICD-10-CM | POA: Diagnosis not present

## 2015-05-02 DIAGNOSIS — I447 Left bundle-branch block, unspecified: Secondary | ICD-10-CM

## 2015-05-02 DIAGNOSIS — K922 Gastrointestinal hemorrhage, unspecified: Secondary | ICD-10-CM | POA: Diagnosis not present

## 2015-05-02 DIAGNOSIS — Z95810 Presence of automatic (implantable) cardiac defibrillator: Secondary | ICD-10-CM

## 2015-05-02 DIAGNOSIS — D638 Anemia in other chronic diseases classified elsewhere: Secondary | ICD-10-CM

## 2015-05-02 DIAGNOSIS — I2510 Atherosclerotic heart disease of native coronary artery without angina pectoris: Secondary | ICD-10-CM

## 2015-05-02 LAB — CBC WITH DIFFERENTIAL/PLATELET
BASOS ABS: 0 10*3/uL (ref 0.0–0.1)
Basophils Relative: 1 %
Eosinophils Absolute: 0.2 10*3/uL (ref 0.0–0.7)
Eosinophils Relative: 3 %
HEMATOCRIT: 30.7 % — AB (ref 36.0–46.0)
HEMOGLOBIN: 9.4 g/dL — AB (ref 12.0–15.0)
LYMPHS PCT: 33 %
Lymphs Abs: 1.7 10*3/uL (ref 0.7–4.0)
MCH: 25.5 pg — ABNORMAL LOW (ref 26.0–34.0)
MCHC: 30.6 g/dL (ref 30.0–36.0)
MCV: 83.4 fL (ref 78.0–100.0)
MONO ABS: 1 10*3/uL (ref 0.1–1.0)
Monocytes Relative: 20 %
NEUTROS ABS: 2.3 10*3/uL (ref 1.7–7.7)
NEUTROS PCT: 43 %
Platelets: 276 10*3/uL (ref 150–400)
RBC: 3.68 MIL/uL — ABNORMAL LOW (ref 3.87–5.11)
RDW: 20.7 % — AB (ref 11.5–15.5)
WBC: 5.2 10*3/uL (ref 4.0–10.5)

## 2015-05-02 NOTE — Progress Notes (Signed)
[place], MD [address] [place] [address]    DIAGNOSIS: Iron deficiency anemia, microcytic anemia with hgb 8.5, mcv 70 on 09/02/2013 Carcinoma of the Left breast diagnosed in 1992, modified radical mastectomy, 5 years tamoxifen, no XRT CKD, stage II mild EGD and colonoscopy 11/112016 Dr. Laural Golden, single small AVM at post bulbar duodenum, exam performed to cecum with suboptimal prep at cecum and ascending colon AICD   CURRENT THERAPY: IV iron, prn PRBC's  INTERVAL HISTORY: [name] 75 y.o. female returns for follow-up of a history of carcinoma the left breast diagnosed back in 1992. She also has an anemia for which she has received IV iron. In spite of IV iron replacement she has remained anemic with her last hemoglobin at 9.4. At presentation in early 2015 she had a microcytic anemia and documented iron deficiency. She complains of fatigue but otherwise has no other major concerns.   She has had worsening anemia recently requiring transfusion. She did have a GI evaluation that showed an AVM. Her hgb was 6.8 on 10/13. Serum ferritin levels have been good. She denies blood in her stool.  [name] is here with her brother and reports she has been feeling a whole lot better. She experiences pain in her hands and feet sometimes from the rheumatoid arthritis. Recent RF was > 650 and CCP antibodies 200.   She states she sees a [employer] officer in [place] but missed her last appointment.  She has not followed up with cardiology recently and notes that sometimes she feels tight in her chest.   MEDICAL HISTORY: Past Medical History  Diagnosis Date  . Arteriosclerotic cardiovascular disease (ASCVD)     Remote PTCA by patient report; LBBB; associated cardiomyopathy, presumed ischemic with EF 40-45% previously, 20% in 06/2009; h/o clinical congestive heart failure; negative stress nuclear in 2009 with inferoseptal and apical scar  . Anemia     Hgb of 9-10  .  Hyperlipidemia   . Breast cancer ([place])   . Hypertension   . Automatic implantable cardioverter-defibrillator in situ   . HOH (hard of hearing)   . Anemia of chronic disease     Hgb of 9-10 chronically; 06/2010: H&H-10.7/33.5, MCV-81, normal iron studies in 2010     has Dyslipidemia; Chronic systolic heart failure ([place]); Anemia of chronic disease; Breast cancer ([place]); Hypertension; Biventricular ICD in place (MDT 2014); Hypokalemia; Pain and swelling of wrist; Chest pain; Rheumatoid arthritis ([place]); HCAP (healthcare-associated pneumonia); Sepsis ([place]); ARF (acute renal failure) ([place]); PNA (pneumonia); Malnutrition of moderate degree ([place]); Iron deficiency anemia; CHF exacerbation ([place]); Acute on chronic combined systolic and diastolic congestive heart failure ([place]); CAD S/P remote PCI- no details; Cardiomyopathy, ischemic-EF 30-35% March 6213; and Diastolic dysfunction-grade 2 on her problem list.     has No Known Allergies.  [name] does not currently have medications on file.  SURGICAL HISTORY: Past Surgical History  Procedure Laterality Date  . Total knee arthroplasty Right     Dr.Harrison  . Bi-ventricular implantable cardioverter defibrillator  (crt-d)  07/28/2012  . Cataract extraction w/ intraocular lens implant Left   . Tubal ligation    . Breast biopsy Bilateral   . Mastectomy Left 1998  . Cataract extraction w/phaco Right 09/15/2013    Procedure: CATARACT EXTRACTION PHACO AND INTRAOCULAR LENS PLACEMENT (IOC);  Surgeon: Elta Guadeloupe T. Gershon Crane, MD;  Location: [facility];  Service: Ophthalmology;  Laterality: Right;  CDE:  10.74  . Bi-ventricular implantable cardioverter defibrillator N/A 07/28/2012  Procedure: BI-VENTRICULAR IMPLANTABLE CARDIOVERTER DEFIBRILLATOR  (CRT-D);  Surgeon: Evans Lance, MD;  Location: [facility] CATH LAB;  Service: Cardiovascular;  Laterality: N/A;  . Colonoscopy N/A 03/04/2015    Procedure: COLONOSCOPY;  Surgeon: Rogene Houston, MD;  Location: [facility];  Service:  Endoscopy;  Laterality: N/A;  10:50   . Esophagogastroduodenoscopy N/A 03/04/2015    Procedure: ESOPHAGOGASTRODUODENOSCOPY (EGD);  Surgeon: Rogene Houston, MD;  Location: [facility];  Service: Endoscopy;  Laterality: N/A;    SOCIAL HISTORY: Social History   Social History  . Marital Status: Married    Spouse Name: N/A  . Number of Children: N/A  . Years of Education: N/A   Occupational History  . Retired    Social History Main Topics  . Smoking status: Former Smoker -- 0.25 packs/day    Types: Cigarettes    Start date: 09/17/1956    Quit date: 04/24/2007  . Smokeless tobacco: Current User    Types: Chew     Comment: 07/03/2013 "smoked some; don't know how much or how long or when I quit"  . Alcohol Use: No  . Drug Use: No  . Sexual Activity: Not Currently   Other Topics Concern  . Not on file   Social History Narrative    FAMILY HISTORY: Family History  Problem Relation Age of Onset  . Diabetes Father   . Cancer Father   . Hypertension Brother     Review of Systems  Constitutional: Negative for malaise/fatigue, fever, chills and weight loss.  HENT: Negative for congestion, hearing loss, nosebleeds, sore throat and tinnitus.   Eyes: Negative for blurred vision, double vision, pain and discharge.  Respiratory: Negative for cough, hemoptysis, sputum production, shortness of breath and wheezing.   Cardiovascular: Negative for chest pain, palpitations, claudication, leg swelling and PND.  Gastrointestinal: Negative for heartburn, nausea, vomiting, abdominal pain, diarrhea, constipation, blood in stool and melena.  Genitourinary: Negative for dysuria, urgency, frequency and hematuria.  Musculoskeletal: Positive for joint pain. Negative for myalgias and falls.     Rheumatoid arthritis Skin: Negative for itching and rash.  Neurological: Negative for dizziness, tingling, tremors, sensory change, speech change, focal weakness, seizures, loss of consciousness, weakness  and headaches.  Endo/Heme/Allergies: Does not bruise/bleed easily.  Psychiatric/Behavioral: Negative for depression, suicidal ideas, memory loss and substance abuse. The patient is not nervous/anxious and does not have insomnia.   14 point review of systems was performed and is negative except as detailed under history of present illness and above  PHYSICAL EXAMINATION  ECOG PERFORMANCE STATUS: 1 - Symptomatic but completely ambulatory  Filed Vitals:   05/02/15 1036  BP: 120/44  Pulse: 84  Temp: 98.5 F (36.9 C)  Resp: 18    Physical Exam  Constitutional: She is oriented to person, place, and time and well-developed, well-nourished, and in no distress.  HENT:  Head: Normocephalic and atraumatic.  Nose: Nose normal.  Mouth/Throat: Oropharynx is clear and moist. No oropharyngeal exudate.  Eyes: Conjunctivae and EOM are normal. Pupils are equal, round, and reactive to light. Right eye exhibits no discharge. Left eye exhibits no discharge. No scleral icterus.  Neck: Normal range of motion. Neck supple. No tracheal deviation present. No thyromegaly present.  Cardiovascular: Irregular with ectopy  No murmur heard. Pulmonary/Chest: Effort normal and breath sounds normal. She has no wheezes. She has no rales.  Abdominal: Soft. Bowel sounds are normal. She exhibits no distension and no mass. There is no tenderness. There is no rebound and no  guarding.  Musculoskeletal: Normal range of motion. She exhibits no edema.  Lymphadenopathy:    She has no cervical adenopathy.  Neurological: She is alert and oriented to person, place, and time. She has normal reflexes. No cranial nerve deficit. Gait normal. Coordination normal.  Skin: Skin is warm and dry. No rash noted.  Psychiatric: Mood, memory, affect and judgment normal.  Nursing note and vitals reviewed.   LABORATORY DATA: I have reviewed the data below as listed. CBC    Component Value Date/Time   WBC 5.2 05/02/2015 0946   RBC 3.68*  05/02/2015 0946   RBC 4.30 02/17/2015 1512   HGB 9.4* 05/02/2015 0946   HCT 30.7* 05/02/2015 0946   PLT 276 05/02/2015 0946   MCV 83.4 05/02/2015 0946   MCH 25.5* 05/02/2015 0946   MCHC 30.6 05/02/2015 0946   RDW 20.7* 05/02/2015 0946   LYMPHSABS 1.7 05/02/2015 0946   MONOABS 1.0 05/02/2015 0946   EOSABS 0.2 05/02/2015 0946   BASOSABS 0.0 05/02/2015 0946   CMP     Component Value Date/Time   NA 138 02/03/2015 1027   K 4.2 02/03/2015 1027   CL 102 02/03/2015 1027   CO2 27 02/03/2015 1027   GLUCOSE 101* 02/03/2015 1027   BUN 19 02/03/2015 1027   CREATININE 1.09* 02/03/2015 1027   CREATININE 1.23* 09/17/2014 1347   CALCIUM 8.9 02/03/2015 1027   PROT 8.2* 02/03/2015 1027   ALBUMIN 2.6* 02/03/2015 1027   AST 18 02/03/2015 1027   ALT 11* 02/03/2015 1027   ALKPHOS 93 02/03/2015 1027   BILITOT 0.3 02/03/2015 1027   GFRNONAA 48* 02/03/2015 1027   GFRAA 56* 02/03/2015 1027       ASSESSMENT and THERAPY PLAN:  Anemia History of iron deficiency, currently normal iron studies CAD, ICD, chronic LBBB CKD, stage II Normal B12 and folate 2/2/106 History Elevated ESR and CRP 05/2014 Normal SPEP/IEP in 2010 EGD and colonoscopy 11/112016 Dr. Laural Golden, single small AVM at post bulbar duodenum, exam performed to cecum with suboptimal prep at cecum and ascending colon AICD  The cause of her anemia is most likely multifactorial including CKD, chronic GI related blood loss and resultant iron deficiency. In addition I suspect a strong component of anemia of chronic disease from her RA, recent RA markers are quite elevated and she complains more of joint pain.  I have asked her to follow-up with her rheumatologist. It is hard for her to get to her appointments in [place]. Her nephew can only take her on his work days off.  She is currently agreeable to close observation of counts with iron or PRBC's as needed.  She has no history of a bone marrow biopsy and this may need to be considered as well,  currently however I feel observation is reasonable. She understands to let us know if she notices any blood in her stool.  She will continue to follow-up monthly with labs.   I have referred her back to cardiology.   All questions were answered. The patient knows to call the clinic with any problems, questions or concerns. We can certainly see the patient much sooner if necessary.   This note was electronically signed.  Kelby Fam. Whitney Muse, MD

## 2015-05-02 NOTE — Patient Instructions (Addendum)
[place] at [facility] Discharge Instructions  RECOMMENDATIONS MADE BY THE CONSULTANT AND ANY TEST RESULTS WILL BE SENT TO YOUR REFERRING PHYSICIAN.   Exam completed by Dr Whitney Muse today Hemoglobin today 9.4 Your rheumatoid arthritis labs are not good, so that's why some of your blood counts keep getting low. We need to keep an eye on your blood counts   Make sure to make an appt with your rheumatoid arthritis doctor Also make an appt with your heart doctor, referral in for heart doctors here in [place], they will call you We will check your blood counts every 2 weeks    Return to see the doctor in 4 weeks Please call the clinic if you have any questions or concerns     Thank you for choosing [place] at [facility] to provide your oncology and hematology care.  To afford each patient quality time with our provider, please arrive at least 15 minutes before your scheduled appointment time.    You need to re-schedule your appointment should you arrive 10 or more minutes late.  We strive to give you quality time with our providers, and arriving late affects you and other patients whose appointments are after yours.  Also, if you no show three or more times for appointments you may be dismissed from the clinic at the providers discretion.     Again, thank you for choosing [facility].  Our hope is that these requests will decrease the amount of time that you wait before being seen by our physicians.       _____________________________________________________________  Should you have questions after your visit to [facility], please contact our office at [phone] between the hours of 8:30 a.m. and 4:30 p.m.  Voicemails left after 4:30 p.m. will not be returned until the following business day.  For prescription refill requests, have your pharmacy contact our office.

## 2015-05-16 ENCOUNTER — Encounter (HOSPITAL_COMMUNITY): Payer: Medicare Other

## 2015-05-16 ENCOUNTER — Telehealth: Payer: Self-pay

## 2015-05-16 DIAGNOSIS — D649 Anemia, unspecified: Secondary | ICD-10-CM | POA: Diagnosis not present

## 2015-05-16 DIAGNOSIS — D638 Anemia in other chronic diseases classified elsewhere: Secondary | ICD-10-CM

## 2015-05-16 LAB — CBC WITH DIFFERENTIAL/PLATELET
BASOS PCT: 1 %
Basophils Absolute: 0.1 10*3/uL (ref 0.0–0.1)
Eosinophils Absolute: 0.2 10*3/uL (ref 0.0–0.7)
Eosinophils Relative: 3 %
HEMATOCRIT: 30.1 % — AB (ref 36.0–46.0)
Hemoglobin: 9.5 g/dL — ABNORMAL LOW (ref 12.0–15.0)
LYMPHS ABS: 2.1 10*3/uL (ref 0.7–4.0)
LYMPHS PCT: 32 %
MCH: 26.1 pg (ref 26.0–34.0)
MCHC: 31.6 g/dL (ref 30.0–36.0)
MCV: 82.7 fL (ref 78.0–100.0)
MONO ABS: 0.8 10*3/uL (ref 0.1–1.0)
MONOS PCT: 12 %
NEUTROS ABS: 3.5 10*3/uL (ref 1.7–7.7)
Neutrophils Relative %: 52 %
Platelets: 235 10*3/uL (ref 150–400)
RBC: 3.64 MIL/uL — ABNORMAL LOW (ref 3.87–5.11)
RDW: 19.2 % — AB (ref 11.5–15.5)
WBC: 6.6 10*3/uL (ref 4.0–10.5)

## 2015-05-16 NOTE — Telephone Encounter (Signed)
ICM transmission received.  Attempted call to nephew [name] at [phone] and line busy.

## 2015-05-20 NOTE — Telephone Encounter (Signed)
Unable to reach nephew, [name], for remote monthly ICM follow up.  Next remote ICM transmission scheduled for 06/10/2015 and patient letter sent with new date and to call if experiencing any symptoms.    Optivol thoracic impedance below reference line from 04/10/2015 to 05/16/2015 suggesting fluid retention.  FYI for Dr [name]  05/16/2015 ICM trend:

## 2015-05-29 ENCOUNTER — Other Ambulatory Visit: Payer: Self-pay | Admitting: Adult Health

## 2015-05-29 NOTE — Progress Notes (Signed)
[name], MD [address] [place] [address]  Anemia of chronic disease - Plan: CBC with Differential  Rheumatoid arthritis involving multiple sites, unspecified rheumatoid factor presence (HCC)  Burning with urination - Plan: Urinalysis, Routine w reflex microscopic, Urinalysis, Routine w reflex microscopic  CURRENT THERAPY: S/P 2 units of PRBCs on 02/04/2015  INTERVAL HISTORY: [name] 75 y.o. female returns for followup iron deficiency anemia in the setting of Stage II chronic renal disease, requiring infrequent IV replacement, and rheumatoid arthritis, on Plaquenil.  S/P EGD/colonoscopy by Dr. Laural Golden on 03/04/2015 demonstrating a small AVM in duodenum, sub-optimal prep on colonoscopy with small polyps which could have hindered the ability to identify AVMs. No camera capsule study at this time. AND Distant history of left breast cancer in 1992 treated with radial mastectomy followed by 5 years worth of tamoxifen without XRT.  I personally reviewed and went over laboratory results with the patient.  The results are noted within this dictation. Her HGB is 8.9 g/dL.    She has an upcoming appointment with Dr. Estanislado Pandy on 06/09/2015 which is very important for the patient based upon her recent lab results.  [name] is accompanied by her nephew, [name], who I have educated on the reason we see [name] and our concerns regarding her anemia and rheumatoid arthritis.  He is very understanding of the current situation.  [name] reports some urinary burning and therefore, I will get a UA today.  Her history is very confusing regarding that she says that she had it about 1 month ago and this was treated by her primary care provider, HOWEVER, her back hurts, "like my kidneys hurt."  She denies any complaints and ROS questioning is negative.  Past Medical History  Diagnosis Date  . Arteriosclerotic cardiovascular disease (ASCVD)     Remote PTCA by patient report; LBBB;  associated cardiomyopathy, presumed ischemic with EF 40-45% previously, 20% in 06/2009; h/o clinical congestive heart failure; negative stress nuclear in 2009 with inferoseptal and apical scar  . Anemia     Hgb of 9-10  . Hyperlipidemia   . Breast cancer ([place])   . Hypertension   . Automatic implantable cardioverter-defibrillator in situ   . HOH (hard of hearing)   . Anemia of chronic disease     Hgb of 9-10 chronically; 06/2010: H&H-10.7/33.5, MCV-81, normal iron studies in 2010     has Dyslipidemia; Chronic systolic heart failure ([place]); Anemia of chronic disease; Breast cancer ([place]); Hypertension; Biventricular ICD in place (MDT 2014); Hypokalemia; Pain and swelling of wrist; Chest pain; Rheumatoid arthritis ([place]); HCAP (healthcare-associated pneumonia); Sepsis ([place]); ARF (acute renal failure) ([place]); PNA (pneumonia); Malnutrition of moderate degree ([place]); Iron deficiency anemia; CHF exacerbation ([place]); Acute on chronic combined systolic and diastolic congestive heart failure ([place]); CAD S/P remote PCI- no details; Cardiomyopathy, ischemic-EF 30-35% March [id number]; and Diastolic dysfunction-grade 2 on her problem list.     has No Known Allergies.  Current Outpatient Prescriptions on File Prior to Visit  Medication Sig Dispense Refill  . acetaminophen (TYLENOL) 500 MG tablet Take 1,000 mg by mouth every 6 (six) hours as needed.     [name] aspirin 325 MG EC tablet Take 325 mg by mouth 2 (two) times daily before a meal.     . carvedilol (COREG) 6.25 MG tablet TAKE 1 TABLET (6.25 MG TOTAL) BY MOUTH 2 (TWO) TIMES DAILY WITH A MEAL. 60 tablet 5  . furosemide (LASIX) 40 MG tablet TAKE 1 TABLET  BY MOUTH TWICE A DAY 60 tablet 6  . hydroxychloroquine (PLAQUENIL) 200 MG tablet Take 100-200 mg by mouth 2 (two) times daily. Takes a whole tablet in the morning and half a tablet in the evening .    [name] KLOR-CON M10 10 MEQ tablet TAKE 1 TABLET (10 MEQ TOTAL) BY MOUTH 2 (TWO) TIMES DAILY. 60 tablet 6  . lisinopril  (PRINIVIL,ZESTRIL) 20 MG tablet Take 1 tablet (20 mg total) by mouth daily. 90 tablet 3  . lovastatin (MEVACOR) 40 MG tablet TAKE 1 TABLET (40 MG TOTAL) BY MOUTH AT BEDTIME. 30 tablet 3  . omeprazole (PRILOSEC) 20 MG capsule Take 20 mg by mouth daily. For gas/heartburn    . PROAIR HFA 108 (90 BASE) MCG/ACT inhaler Inhale 2 puffs into the lungs every 4 (four) hours as needed for wheezing or shortness of breath. Reported on 05/02/2015    . predniSONE (DELTASONE) 10 MG tablet Take 40 mg x 3 days, 20 mg x 3 days, 10 mg thereafter (Patient not taking: Reported on 05/30/2015) 45 tablet 0   No current facility-administered medications on file prior to visit.    Past Surgical History  Procedure Laterality Date  . Total knee arthroplasty Right     Dr.Harrison  . Bi-ventricular implantable cardioverter defibrillator  (crt-d)  07/28/2012  . Cataract extraction w/ intraocular lens implant Left   . Tubal ligation    . Breast biopsy Bilateral   . Mastectomy Left 1998  . Cataract extraction w/phaco Right 09/15/2013    Procedure: CATARACT EXTRACTION PHACO AND INTRAOCULAR LENS PLACEMENT (IOC);  Surgeon: Elta Guadeloupe T. Gershon Crane, MD;  Location: [facility] ORS;  Service: Ophthalmology;  Laterality: Right;  CDE:  10.74  . Bi-ventricular implantable cardioverter defibrillator N/A 07/28/2012    Procedure: BI-VENTRICULAR IMPLANTABLE CARDIOVERTER DEFIBRILLATOR  (CRT-D);  Surgeon: Evans Lance, MD;  Location: [facility] CATH LAB;  Service: Cardiovascular;  Laterality: N/A;  . Colonoscopy N/A 03/04/2015    Procedure: COLONOSCOPY;  Surgeon: Rogene Houston, MD;  Location: [facility] ENDO SUITE;  Service: Endoscopy;  Laterality: N/A;  10:50   . Esophagogastroduodenoscopy N/A 03/04/2015    Procedure: ESOPHAGOGASTRODUODENOSCOPY (EGD);  Surgeon: Rogene Houston, MD;  Location: [facility] ENDO SUITE;  Service: Endoscopy;  Laterality: N/A;    Denies any headaches, dizziness, double vision, fevers, chills, night sweats, nausea, vomiting, diarrhea, constipation, chest  pain, heart palpitations, shortness of breath, blood in stool, black tarry stool, urinary pain, urinary burning, urinary frequency, hematuria.   PHYSICAL EXAMINATION  ECOG PERFORMANCE STATUS: 1 - Symptomatic but completely ambulatory  Filed Vitals:   05/30/15 1431  BP: 136/58  Pulse: 90  Temp: 98.6 F (37 C)  Resp: 20    GENERAL:alert, no distress, comfortable, cooperative, smiling and accompanied by her nephew, [name], who works for [employer]. SKIN: skin color, texture, turgor are normal, no rashes or significant lesions HEAD: Normocephalic, No masses, lesions, tenderness or abnormalities EYES: normal, PERRLA, EOMI, Conjunctiva are pink and non-injected EARS: External ears normal OROPHARYNX:lips, buccal mucosa, and tongue normal and mucous membranes are moist  NECK: supple, no adenopathy, trachea midline LYMPH:  no palpable lymphadenopathy BREAST:not examined LUNGS: clear to auscultation  HEART: regular rate & rhythm ABDOMEN:abdomen soft and normal bowel sounds BACK: Back symmetric, no curvature. EXTREMITIES:less then 2 second capillary refill, joint deformities associated with RA, effusion, or inflammation, no skin discoloration, no cyanosis NEURO: alert & oriented x 3 with fluent speech, very hard of hearing, gait normal   LABORATORY DATA: CBC    Component Value Date/Time  WBC 5.1 05/30/2015 1250   RBC 3.41* 05/30/2015 1250   RBC 4.30 02/17/2015 1512   HGB 8.9* 05/30/2015 1250   HCT 28.5* 05/30/2015 1250   PLT 239 05/30/2015 1250   MCV 83.6 05/30/2015 1250   MCH 26.1 05/30/2015 1250   MCHC 31.2 05/30/2015 1250   RDW 17.4* 05/30/2015 1250   LYMPHSABS 1.6 05/30/2015 1250   MONOABS 0.8 05/30/2015 1250   EOSABS 0.2 05/30/2015 1250   BASOSABS 0.0 05/30/2015 1250      Chemistry      Component Value Date/Time   NA 138 02/03/2015 1027   K 4.2 02/03/2015 1027   CL 102 02/03/2015 1027   CO2 27 02/03/2015 1027   BUN 19 02/03/2015 1027   CREATININE 1.09* 02/03/2015  1027   CREATININE 1.23* 09/17/2014 1347      Component Value Date/Time   CALCIUM 8.9 02/03/2015 1027   ALKPHOS 93 02/03/2015 1027   AST 18 02/03/2015 1027   ALT 11* 02/03/2015 1027   BILITOT 0.3 02/03/2015 1027      Lab Results  Component Value Date   IRON 31 02/17/2015   TIBC 185* 02/17/2015   FERRITIN 356* 03/31/2015   Urine dipstick shows negative for all components.  Micro exam: not done.   Results for Savannah, Holden (MRN [id number]) as of 05/30/2015 18:55  Ref. Range 03/31/2015 08:53  Sed Rate Latest Ref Range: 0-22 mm/hr 108 (H)    Results for Savannah, Holden (MRN [id number]) as of 05/30/2015 18:55  Ref. Range 03/31/2015 08:54  Rhuematoid fact SerPl-aCnc Latest Ref Range: 0.0-13.9 IU/mL >650.0 (H)    Results for Savannah, Holden (MRN [id number]) as of 05/30/2015 18:55  Ref. Range 03/31/2015 08:53  CCP Antibodies IgG/IgA Latest Ref Range: 0-19 units 208 (H)     PENDING LABS:   RADIOGRAPHIC STUDIES:  No results found.   PATHOLOGY:    ASSESSMENT AND PLAN:  Anemia of chronic disease Iron deficiency anemia in the setting of Stage II chronic renal disease, requiring infrequent IV replacement, and rheumatoid arthritis, on Plaquenil.  S/P EGD/colonoscopy by Dr. Laural Golden on 03/04/2015 demonstrating a small AVM in duodenum, sub-optimal prep on colonoscopy with small polyps which could have hindered the ability to identify AVMs. No camera capsule study at this time.  Labs today: CBC diff.  UA added today for possible UTI complaints.  UA is negative for concerning findings suspicious for UTI.  Labs in 4 weeks: CBC diff  Return in 4 weeks for follow-up.  Rheumatoid arthritis She is on Plaquenil daily.  Her rheumatologist is Dr. Estanislado Pandy and she has an upcoming appointment on 06/09/2015.    THERAPY PLAN:  Continue surveillance of labs.  All questions were answered. The patient knows to call the clinic with any problems, questions or concerns. We can certainly see the  patient much sooner if necessary.  Patient and plan discussed with Dr. Ancil Linsey and she is in agreement with the aforementioned.   This note is electronically signed by: Doy Mince 05/30/2015 6:58 PM

## 2015-05-29 NOTE — Assessment & Plan Note (Addendum)
She is on Plaquenil daily.  Her rheumatologist is Dr. Estanislado Pandy and she has an upcoming appointment on 06/09/2015.

## 2015-05-29 NOTE — Assessment & Plan Note (Addendum)
Iron deficiency anemia in the setting of Stage II chronic renal disease, requiring infrequent IV replacement, and rheumatoid arthritis, on Plaquenil.  S/P EGD/colonoscopy by Dr. Laural Golden on 03/04/2015 demonstrating a small AVM in duodenum, sub-optimal prep on colonoscopy with small polyps which could have hindered the ability to identify AVMs. No camera capsule study at this time.  Labs today: CBC diff.  UA added today for possible UTI complaints.  UA is negative for concerning findings suspicious for UTI.  Labs in 4 weeks: CBC diff  Return in 4 weeks for follow-up.

## 2015-05-30 ENCOUNTER — Encounter (HOSPITAL_COMMUNITY): Payer: Medicare Other | Attending: Hematology & Oncology | Admitting: Oncology

## 2015-05-30 ENCOUNTER — Encounter (HOSPITAL_COMMUNITY): Payer: Self-pay | Admitting: Oncology

## 2015-05-30 ENCOUNTER — Encounter (HOSPITAL_COMMUNITY): Payer: Medicare Other

## 2015-05-30 VITALS — BP 136/58 | HR 90 | Temp 98.6°F | Resp 20 | Wt 148.4 lb

## 2015-05-30 DIAGNOSIS — R300 Dysuria: Secondary | ICD-10-CM | POA: Diagnosis not present

## 2015-05-30 DIAGNOSIS — M069 Rheumatoid arthritis, unspecified: Secondary | ICD-10-CM | POA: Diagnosis not present

## 2015-05-30 DIAGNOSIS — D638 Anemia in other chronic diseases classified elsewhere: Secondary | ICD-10-CM | POA: Diagnosis not present

## 2015-05-30 DIAGNOSIS — D649 Anemia, unspecified: Secondary | ICD-10-CM | POA: Diagnosis not present

## 2015-05-30 LAB — URINALYSIS, ROUTINE W REFLEX MICROSCOPIC
Bilirubin Urine: NEGATIVE
GLUCOSE, UA: NEGATIVE mg/dL
Hgb urine dipstick: NEGATIVE
KETONES UR: NEGATIVE mg/dL
LEUKOCYTES UA: NEGATIVE
Nitrite: NEGATIVE
PH: 5 (ref 5.0–8.0)
Protein, ur: NEGATIVE mg/dL
SPECIFIC GRAVITY, URINE: 1.02 (ref 1.005–1.030)

## 2015-05-30 LAB — CBC WITH DIFFERENTIAL/PLATELET
BASOS ABS: 0 10*3/uL (ref 0.0–0.1)
Basophils Relative: 1 %
Eosinophils Absolute: 0.2 10*3/uL (ref 0.0–0.7)
Eosinophils Relative: 3 %
HEMATOCRIT: 28.5 % — AB (ref 36.0–46.0)
HEMOGLOBIN: 8.9 g/dL — AB (ref 12.0–15.0)
LYMPHS PCT: 31 %
Lymphs Abs: 1.6 10*3/uL (ref 0.7–4.0)
MCH: 26.1 pg (ref 26.0–34.0)
MCHC: 31.2 g/dL (ref 30.0–36.0)
MCV: 83.6 fL (ref 78.0–100.0)
MONO ABS: 0.8 10*3/uL (ref 0.1–1.0)
MONOS PCT: 15 %
NEUTROS ABS: 2.6 10*3/uL (ref 1.7–7.7)
NEUTROS PCT: 50 %
Platelets: 239 10*3/uL (ref 150–400)
RBC: 3.41 MIL/uL — ABNORMAL LOW (ref 3.87–5.11)
RDW: 17.4 % — AB (ref 11.5–15.5)
WBC: 5.1 10*3/uL (ref 4.0–10.5)

## 2015-05-30 NOTE — Patient Instructions (Signed)
..  [place] at [facility] Discharge Instructions  RECOMMENDATIONS MADE BY THE CONSULTANT AND ANY TEST RESULTS WILL BE SENT TO YOUR REFERRING PHYSICIAN.  Appointment with Dr. Gwyneth Revels on 2/16 U/a today Labs in 4 weeks and see Dr. Whitney Muse  Thank you for choosing [place] at [facility] to provide your oncology and hematology care.  To afford each patient quality time with our provider, please arrive at least 15 minutes before your scheduled appointment time.   Beginning January 23rd 2017 lab work for the [facility] will be done in the  Main lab at [facility] on 1st floor. If you have a lab appointment with the [facility] please come in thru the  Main Entrance and check in at the main information desk  You need to re-schedule your appointment should you arrive 10 or more minutes late.  We strive to give you quality time with our providers, and arriving late affects you and other patients whose appointments are after yours.  Also, if you no show three or more times for appointments you may be dismissed from the clinic at the providers discretion.     Again, thank you for choosing [facility].  Our hope is that these requests will decrease the amount of time that you wait before being seen by our physicians.       _____________________________________________________________  Should you have questions after your visit to [facility], please contact our office at [phone] between the hours of 8:30 a.m. and 4:30 p.m.  Voicemails left after 4:30 p.m. will not be returned until the following business day.  For prescription refill requests, have your pharmacy contact our office.

## 2015-06-01 ENCOUNTER — Ambulatory Visit (HOSPITAL_COMMUNITY): Payer: Medicare Other | Admitting: Hematology & Oncology

## 2015-06-01 ENCOUNTER — Other Ambulatory Visit (HOSPITAL_COMMUNITY): Payer: Medicare Other

## 2015-06-07 ENCOUNTER — Other Ambulatory Visit: Payer: Self-pay | Admitting: Cardiovascular Disease

## 2015-06-13 ENCOUNTER — Other Ambulatory Visit (HOSPITAL_COMMUNITY): Payer: Self-pay | Admitting: Rheumatology

## 2015-06-13 ENCOUNTER — Other Ambulatory Visit (HOSPITAL_COMMUNITY)
Admission: RE | Admit: 2015-06-13 | Discharge: 2015-06-13 | Disposition: A | Discharge status: Home / Self Care | Payer: Medicare Other | Source: Ambulatory Visit | Attending: Rheumatology | Admitting: Rheumatology

## 2015-06-13 ENCOUNTER — Telehealth: Payer: Self-pay

## 2015-06-13 ENCOUNTER — Ambulatory Visit (HOSPITAL_COMMUNITY)
Admission: RE | Admit: 2015-06-13 | Discharge: 2015-06-13 | Disposition: A | Discharge status: Home / Self Care | Payer: Medicare Other | Source: Ambulatory Visit | Attending: Rheumatology | Admitting: Rheumatology

## 2015-06-13 DIAGNOSIS — Z139 Encounter for screening, unspecified: Secondary | ICD-10-CM | POA: Diagnosis not present

## 2015-06-13 DIAGNOSIS — I517 Cardiomegaly: Secondary | ICD-10-CM | POA: Insufficient documentation

## 2015-06-13 DIAGNOSIS — I700 Atherosclerosis of aorta: Secondary | ICD-10-CM | POA: Insufficient documentation

## 2015-06-13 DIAGNOSIS — Z95810 Presence of automatic (implantable) cardiac defibrillator: Secondary | ICD-10-CM | POA: Diagnosis not present

## 2015-06-13 DIAGNOSIS — J849 Interstitial pulmonary disease, unspecified: Secondary | ICD-10-CM | POA: Diagnosis not present

## 2015-06-13 LAB — CBC WITH DIFFERENTIAL/PLATELET
BASOS ABS: 0 10*3/uL (ref 0.0–0.1)
Basophils Relative: 1 %
EOS PCT: 2 %
Eosinophils Absolute: 0.1 10*3/uL (ref 0.0–0.7)
HCT: 29.7 % — ABNORMAL LOW (ref 36.0–46.0)
HEMOGLOBIN: 9.2 g/dL — AB (ref 12.0–15.0)
LYMPHS PCT: 32 %
Lymphs Abs: 1.6 10*3/uL (ref 0.7–4.0)
MCH: 26.2 pg (ref 26.0–34.0)
MCHC: 31 g/dL (ref 30.0–36.0)
MCV: 84.6 fL (ref 78.0–100.0)
Monocytes Absolute: 0.6 10*3/uL (ref 0.1–1.0)
Monocytes Relative: 12 %
NEUTROS ABS: 2.7 10*3/uL (ref 1.7–7.7)
NEUTROS PCT: 53 %
PLATELETS: 305 10*3/uL (ref 150–400)
RBC: 3.51 MIL/uL — AB (ref 3.87–5.11)
RDW: 16 % — ABNORMAL HIGH (ref 11.5–15.5)
WBC: 5.1 10*3/uL (ref 4.0–10.5)

## 2015-06-13 LAB — COMPREHENSIVE METABOLIC PANEL
ALK PHOS: 109 U/L (ref 38–126)
ALT: 10 U/L — AB (ref 14–54)
AST: 16 U/L (ref 15–41)
Albumin: 3.3 g/dL — ABNORMAL LOW (ref 3.5–5.0)
Anion gap: 13 (ref 5–15)
BUN: 26 mg/dL — AB (ref 6–20)
CHLORIDE: 105 mmol/L (ref 101–111)
CO2: 25 mmol/L (ref 22–32)
CREATININE: 1.04 mg/dL — AB (ref 0.44–1.00)
Calcium: 9.1 mg/dL (ref 8.9–10.3)
GFR calc Af Amer: 59 mL/min — ABNORMAL LOW (ref >60)
GFR, EST NON AFRICAN AMERICAN: 51 mL/min — AB (ref >60)
Glucose, Bld: 96 mg/dL (ref 65–99)
Potassium: 3.8 mmol/L (ref 3.5–5.1)
SODIUM: 143 mmol/L (ref 135–145)
Total Bilirubin: 0.3 mg/dL (ref 0.3–1.2)
Total Protein: 8 g/dL (ref 6.5–8.1)

## 2015-06-13 NOTE — Telephone Encounter (Signed)
Returning your call. °

## 2015-06-13 NOTE — Telephone Encounter (Signed)
Remote ICM transmission received.  Attempted patient call to nephew [name] at [phone] and no answer.

## 2015-06-15 LAB — HIV 1/2 AB DIFFERENTIATION
HIV 1 Ab: NEGATIVE
HIV 2 Ab: UNDETERMINED

## 2015-06-15 LAB — RNA QUALITATIVE: HIV 1 RNA Qualitative: 1

## 2015-06-15 LAB — HIV ANTIBODY (ROUTINE TESTING W REFLEX)

## 2015-06-24 ENCOUNTER — Encounter (HOSPITAL_COMMUNITY): Payer: Medicare Other | Attending: Hematology & Oncology | Admitting: Hematology & Oncology

## 2015-06-24 ENCOUNTER — Encounter (HOSPITAL_COMMUNITY): Payer: Self-pay | Admitting: Hematology & Oncology

## 2015-06-24 ENCOUNTER — Encounter (HOSPITAL_COMMUNITY): Payer: Medicare Other

## 2015-06-24 VITALS — BP 141/49 | HR 84 | Temp 98.6°F | Resp 18 | Wt 147.0 lb

## 2015-06-24 DIAGNOSIS — M069 Rheumatoid arthritis, unspecified: Secondary | ICD-10-CM

## 2015-06-24 DIAGNOSIS — D649 Anemia, unspecified: Secondary | ICD-10-CM | POA: Diagnosis not present

## 2015-06-24 DIAGNOSIS — N182 Chronic kidney disease, stage 2 (mild): Secondary | ICD-10-CM

## 2015-06-24 DIAGNOSIS — D638 Anemia in other chronic diseases classified elsewhere: Secondary | ICD-10-CM

## 2015-06-24 DIAGNOSIS — D509 Iron deficiency anemia, unspecified: Secondary | ICD-10-CM | POA: Diagnosis not present

## 2015-06-24 DIAGNOSIS — R700 Elevated erythrocyte sedimentation rate: Secondary | ICD-10-CM

## 2015-06-24 LAB — CBC WITH DIFFERENTIAL/PLATELET
BASOS ABS: 0.1 10*3/uL (ref 0.0–0.1)
BASOS PCT: 1 %
EOS ABS: 0.1 10*3/uL (ref 0.0–0.7)
Eosinophils Relative: 2 %
HCT: 30.4 % — ABNORMAL LOW (ref 36.0–46.0)
HEMOGLOBIN: 9.4 g/dL — AB (ref 12.0–15.0)
LYMPHS ABS: 1.6 10*3/uL (ref 0.7–4.0)
Lymphocytes Relative: 25 %
MCH: 25.9 pg — ABNORMAL LOW (ref 26.0–34.0)
MCHC: 30.9 g/dL (ref 30.0–36.0)
MCV: 83.7 fL (ref 78.0–100.0)
Monocytes Absolute: 1 10*3/uL (ref 0.1–1.0)
Monocytes Relative: 16 %
NEUTROS PCT: 56 %
Neutro Abs: 3.4 10*3/uL (ref 1.7–7.7)
Platelets: 281 10*3/uL (ref 150–400)
RBC: 3.63 MIL/uL — AB (ref 3.87–5.11)
RDW: 15.4 % (ref 11.5–15.5)
WBC: 6.1 10*3/uL (ref 4.0–10.5)

## 2015-06-24 NOTE — Telephone Encounter (Signed)
Unable to reach patient/nephew for remote monthly ICM follow up.  Next remote ICM transmission scheduled for 07/08/2015 and patient letter sent with new date and to call if experiencing any symptoms.    Optivol thoracic impedance below reference line from 05/16/2015 and returning to reference line 06/10/2015.  ICM trend for 06/10/2015

## 2015-06-24 NOTE — Patient Instructions (Addendum)
[place] at [facility] Discharge Instructions  RECOMMENDATIONS MADE BY THE CONSULTANT AND ANY TEST RESULTS WILL BE SENT TO YOUR REFERRING PHYSICIAN.   Exam and discussion by Dr Whitney Muse today We are going to look at your records to see if you have previously had a heart murmur.  If you have not  then we may be calling you to do a echo of your heart. Monthly labs Return to see the doctor in 3 months Please call the clinic if you have any questions or concerns      Thank you for choosing [place] at [facility] to provide your oncology and hematology care.  To afford each patient quality time with our provider, please arrive at least 15 minutes before your scheduled appointment time.   Beginning January 23rd 2017 lab work for the [facility] will be done in the  Main lab at [facility] on 1st floor. If you have a lab appointment with the [facility] please come in thru the  Main Entrance and check in at the main information desk  You need to re-schedule your appointment should you arrive 10 or more minutes late.  We strive to give you quality time with our providers, and arriving late affects you and other patients whose appointments are after yours.  Also, if you no show three or more times for appointments you may be dismissed from the clinic at the providers discretion.     Again, thank you for choosing [facility].  Our hope is that these requests will decrease the amount of time that you wait before being seen by our physicians.       _____________________________________________________________  Should you have questions after your visit to [facility], please contact our office at [phone] between the hours of 8:30 a.m. and 4:30 p.m.  Voicemails left after 4:30 p.m. will not be returned until the following business day.  For prescription refill requests, have your pharmacy contact our office.          Resources For Cancer Patients and their Caregivers ? American Cancer Society: Can assist with transportation, wigs, general needs, runs Look Good Feel Better.        (928)603-0013 ? Cancer Care: Provides financial assistance, online support groups, medication/co-pay assistance.  1-800-813-HOPE 229-753-4028) ? [place] Assists [place] cancer patients and their families through emotional , educational and financial support.  5878197088 ? [place] DSS Where to apply for food stamps, Medicaid and utility assistance. (517)343-7845 ? [facility]: Transportation to medical appointments. 360-731-7996 ? Social Security Administration: May apply for disability if have a Stage IV cancer. 623-277-6713 346-818-7906 ? [employer], Disability and Transit Services: Assists with nutrition, care and transit needs. 267-821-0090

## 2015-06-24 NOTE — Progress Notes (Signed)
   Holden,[name], MD [address] [place] [address]    DIAGNOSIS: Iron deficiency anemia, microcytic anemia with hgb 8.5, mcv 70 on 09/02/2013 Carcinoma of the Left breast diagnosed in 1992, modified radical mastectomy, 5 years tamoxifen, no XRT CKD, stage II mild EGD and colonoscopy 11/112016 Dr. Rehman, single small AVM at post bulbar duodenum, exam performed to cecum with suboptimal prep at cecum and ascending colon AICD Rheumatoid Arthritis   CURRENT THERAPY: IV iron, prn PRBC's  INTERVAL HISTORY: [name] 74 y.o. female returns for follow-up of a history of carcinoma the left breast diagnosed back in 1992. She also has an anemia for which she has received IV iron. In spite of IV iron replacement she has remained anemic with her last hemoglobin at 9.4. At presentation in early 2015 she had a microcytic anemia and documented iron deficiency. She complains of fatigue but otherwise has no other major concerns.   [name]. She is very hard of hearing. These days, she says that sometimes she feels pretty good, and sometimes she feels "bad, bad, bad." With regards to these symptoms, she says she's been "all over," commenting that "sometimes I think I've had pneumonia." She says when she gets into the bed, she sometimes feels she can't even turn herself over.  Her PCP is Dr. Fanta.   Notably, she remarks that her stomach sometimes "gets hot." To remedy this, she moistens a cloth with alcohol and applies it to her stomach. When asked of other stomach symptoms, she says she never vomits, and that her bowels are "all right." She says she's eating pretty good, but seems to remark that she isn't hungry.  She says she went to the arthritis doctor not that long ago, but cannot elucidate what happened during that appointment.   MEDICAL HISTORY: Past Medical History  Diagnosis Date  . Arteriosclerotic cardiovascular disease (ASCVD)      Remote PTCA by patient report; LBBB; associated cardiomyopathy, presumed ischemic with EF 40-45% previously, 20% in 06/2009; h/o clinical congestive heart failure; negative stress nuclear in 2009 with inferoseptal and apical scar  . Anemia     Hgb of 9-10  . Hyperlipidemia   . Breast cancer (HCC)   . Hypertension   . Automatic implantable cardioverter-defibrillator in situ   . HOH (hard of hearing)   . Anemia of chronic disease     Hgb of 9-10 chronically; 06/2010: H&H-10.7/33.5, MCV-81, normal iron studies in 2010     has Dyslipidemia; Chronic systolic heart failure (HCC); Anemia of chronic disease; Breast cancer (HCC); Hypertension; Biventricular ICD in place (MDT 2014); Hypokalemia; Pain and swelling of wrist; Chest pain; Rheumatoid arthritis (HCC); HCAP (healthcare-associated pneumonia); Sepsis (HCC); ARF (acute renal failure) (HCC); PNA (pneumonia); Malnutrition of moderate degree (HCC); Iron deficiency anemia; CHF exacerbation (HCC); Acute on chronic combined systolic and diastolic congestive heart failure (HCC); CAD S/P remote PCI- no details; Cardiomyopathy, ischemic-EF 30-35% March 2015; and Diastolic dysfunction-grade 2 on her problem list.     has No Known Allergies.  [name] does not currently have medications on file.  SURGICAL HISTORY: Past Surgical History  Procedure Laterality Date  . Total knee arthroplasty Right     Dr.Harrison  . Bi-ventricular implantable cardioverter defibrillator  (crt-d)  07/28/2012  . Cataract extraction w/ intraocular lens implant Left   . Tubal ligation    . Breast biopsy Bilateral   . Mastectomy Left 1998  . Cataract extraction w/phaco   Right 09/15/2013    Procedure: CATARACT EXTRACTION PHACO AND INTRAOCULAR LENS PLACEMENT (IOC);  Surgeon: Elta Guadeloupe T. Gershon Crane, MD;  Location: [facility];  Service: Ophthalmology;  Laterality: Right;  CDE:  10.74  . Bi-ventricular implantable cardioverter defibrillator N/A 07/28/2012    Procedure: BI-VENTRICULAR  IMPLANTABLE CARDIOVERTER DEFIBRILLATOR  (CRT-D);  Surgeon: Evans Lance, MD;  Location[facility] CATH LAB;  Service: Cardiovascular;  Laterality: N/A;  . Colonoscopy N/A 03/04/2015    Procedure: COLONOSCOPY;  Surgeon: Rogene Houston, MD;  Location: [facility];  Service: Endoscopy;  Laterality: N/A;  10:50   . Esophagogastroduodenoscopy N/A 03/04/2015    Procedure: ESOPHAGOGASTRODUODENOSCOPY (EGD);  Surgeon: Rogene Houston, MD;  Location: [facility];  Service: Endoscopy;  Laterality: N/A;    SOCIAL HISTORY: Social History   Social History  . Marital Status: Married    Spouse Name: N/A  . Number of Children: N/A  . Years of Education: N/A   Occupational History  . Retired    Social History Main Topics  . Smoking status: Former Smoker -- 0.25 packs/day    Types: Cigarettes    Start date: 09/17/1956    Quit date: 04/24/2007  . Smokeless tobacco: Current User    Types: Chew     Comment: 07/03/2013 "smoked some; don't know how much or how long or when I quit"  . Alcohol Use: No  . Drug Use: No  . Sexual Activity: Not Currently   Other Topics Concern  . Not on file   Social History Narrative    FAMILY HISTORY: Family History  Problem Relation Age of Onset  . Diabetes Father   . Cancer Father   . Hypertension Brother     Review of Systems  Constitutional: Negative for malaise/fatigue, fever, chills and weight loss.  HENT: Negative for congestion, hearing loss, nosebleeds, sore throat and tinnitus.   Eyes: Negative for blurred vision, double vision, pain and discharge.  Respiratory: Negative for cough, hemoptysis, sputum production, shortness of breath and wheezing.   Cardiovascular: Negative for chest pain, palpitations, claudication, leg swelling and PND.  Gastrointestinal: Negative for heartburn, nausea, vomiting, abdominal pain, diarrhea, constipation, blood in stool and melena.  Genitourinary: Negative for dysuria, urgency, frequency and hematuria.    Musculoskeletal: Positive for joint pain. Negative for myalgias and falls.     Rheumatoid arthritis Skin: Negative for itching and rash.  Neurological: Negative for dizziness, tingling, tremors, sensory change, speech change, focal weakness, seizures, loss of consciousness, weakness and headaches.  Endo/Heme/Allergies: Does not bruise/bleed easily.  Psychiatric/Behavioral: Negative for depression, suicidal ideas, memory loss and substance abuse. The patient is not nervous/anxious and does not have insomnia.   14 point review of systems was performed and is negative except as detailed under history of present illness and above   PHYSICAL EXAMINATION  ECOG PERFORMANCE STATUS: 1 - Symptomatic but completely ambulatory  Filed Vitals:   06/24/15 1308  BP: 141/49  Pulse: 84  Temp: 98.6 F (37 C)  Resp: 18    Physical Exam  Constitutional: She is oriented to person, place, and time and well-developed, well-nourished, and in no distress.  HENT:  Head: Normocephalic and atraumatic.  Nose: Nose normal.  Mouth/Throat: Oropharynx is clear and moist. No oropharyngeal exudate.  Eyes: Conjunctivae and EOM are normal. Pupils are equal, round, and reactive to light. Right eye exhibits no discharge. Left eye exhibits no discharge. No scleral icterus.  Neck: Normal range of motion. Neck supple. No tracheal deviation present. No thyromegaly present.  Cardiovascular: Irregular with ectopy   Murmur heard. Pulmonary/Chest: Effort normal and breath sounds normal. She has no wheezes. She has no rales.  Abdominal: Soft. Bowel sounds are normal. She exhibits no distension and no mass. There is no tenderness. There is no rebound and no guarding.  Musculoskeletal: Normal range of motion. She exhibits no edema.  Lymphadenopathy:    She has no cervical adenopathy.  Neurological: She is alert and oriented to person, place, and time. She has normal reflexes. No cranial nerve deficit. Gait normal. Coordination  normal.  Skin: Skin is warm and dry. No rash noted.  Psychiatric: Mood, memory, affect and judgment normal.  Nursing note and vitals reviewed.   LABORATORY DATA: I have reviewed the data below as listed. CBC    Component Value Date/Time   WBC 6.1 06/24/2015 1236   RBC 3.63* 06/24/2015 1236   RBC 4.30 02/17/2015 1512   HGB 9.4* 06/24/2015 1236   HCT 30.4* 06/24/2015 1236   PLT 281 06/24/2015 1236   MCV 83.7 06/24/2015 1236   MCH 25.9* 06/24/2015 1236   MCHC 30.9 06/24/2015 1236   RDW 15.4 06/24/2015 1236   LYMPHSABS 1.6 06/24/2015 1236   MONOABS 1.0 06/24/2015 1236   EOSABS 0.1 06/24/2015 1236   BASOSABS 0.1 06/24/2015 1236   CMP     Component Value Date/Time   NA 143 06/13/2015 1147   K 3.8 06/13/2015 1147   CL 105 06/13/2015 1147   CO2 25 06/13/2015 1147   GLUCOSE 96 06/13/2015 1147   BUN 26* 06/13/2015 1147   CREATININE 1.04* 06/13/2015 1147   CREATININE 1.23* 09/17/2014 1347   CALCIUM 9.1 06/13/2015 1147   PROT 8.0 06/13/2015 1147   ALBUMIN 3.3* 06/13/2015 1147   AST 16 06/13/2015 1147   ALT 10* 06/13/2015 1147   ALKPHOS 109 06/13/2015 1147   BILITOT 0.3 06/13/2015 1147   GFRNONAA 51* 06/13/2015 1147   GFRAA 59* 06/13/2015 1147    ASSESSMENT and THERAPY PLAN:  Anemia History of iron deficiency, currently normal iron studies CAD, ICD, chronic LBBB CKD, stage II Normal B12 and folate 2/2/106 History Elevated ESR and CRP 05/2014 Normal SPEP/IEP in 2010 EGD and colonoscopy 11/112016 Dr. Rehman, single small AVM at post bulbar duodenum, exam performed to cecum with suboptimal prep at cecum and ascending colon AICD Rheumatoid Arthritis  The cause of her anemia is most likely multifactorial including CKD, chronic GI related blood loss and resultant iron deficiency. In addition I suspect a strong component of anemia of chronic disease from her RA, recent RA markers are quite elevated and she complains more of joint pain.  I have asked her to follow-up with her  rheumatologist. It is hard for her to get to her appointments in [place]. Her nephew can only take her on his work days off.  She is currently agreeable to close observation of counts with iron or PRBC's as needed.  She has no history of a bone marrow biopsy and this may need to be considered as well, currently however I feel observation is reasonable. She understands to let us know if she notices any blood in her stool.  Since her hemoglobin has been staying around 9 we will check a CBC monthly.She will be notified if labs change significantly. She will return in 3 months for ongoing observation.   I will also get the note from the rheumatologist to get a better idea of her last appointment, and what they're thinking in terms of her arthritis.     Orders Placed This Encounter  Procedures  . CBC with Differential    Standing Status: Standing     Number of Occurrences: 10     Standing Expiration Date: 06/23/2017    All questions were answered. The patient knows to call the clinic with any problems, questions or concerns. We can certainly see the patient much sooner if necessary.   This document serves as a record of services personally performed by Shannon Penland, MD. It was created on her behalf by Katherine Galloway, a trained medical scribe. The creation of this record is based on the scribe's personal observations and the provider's statements to them. This document has been checked and approved by the attending provider.  I have reviewed the above documentation for accuracy and completeness and I agree with the above.  This note was electronically signed.  Shannon K. Penland, MD  

## 2015-06-28 ENCOUNTER — Ambulatory Visit (INDEPENDENT_AMBULATORY_CARE_PROVIDER_SITE_OTHER): Payer: Medicare Other | Admitting: Adult Health

## 2015-06-28 ENCOUNTER — Encounter: Payer: Self-pay | Admitting: Adult Health

## 2015-06-28 VITALS — BP 132/60 | HR 92 | Ht 64.0 in | Wt 147.0 lb

## 2015-06-28 DIAGNOSIS — I5022 Chronic systolic (congestive) heart failure: Secondary | ICD-10-CM | POA: Diagnosis not present

## 2015-06-28 DIAGNOSIS — I10 Essential (primary) hypertension: Secondary | ICD-10-CM | POA: Diagnosis not present

## 2015-06-28 NOTE — Progress Notes (Signed)
Cardiology Office Note   Date:  06/28/2015   ID:  [name], DOB December 18, 1940, MRN [id number]  PCP:  [name], MD  Cardiologist:  Bryna Colander, NP   No chief complaint on file.     History of Present Illness: [name] is a 75 y.o. female who presents for ongoing assessment and management of chronic systolic heart failure, CAD, ICD in situ ([employer]), hypertension, with chronic left bundle branch block.patient was last seen in the office in June of 2016, and had been complaining of dizziness and tiredness.  Orthostatics are negative.  Multiple somatic complaints, back pain, stomach pain, and shoulder pain, she is easily tearful, she states she was told that she has arthritis and needs to drink more water.  She is being followed by primary care and by rheumatologist. Her nephew is with her.  He states that she is easily anxious and often takes Tylenol to assist her with her anxiety and any other somatic complaints she may have.  Past Medical History  Diagnosis Date  . Arteriosclerotic cardiovascular disease (ASCVD)     Remote PTCA by patient report; LBBB; associated cardiomyopathy, presumed ischemic with EF 40-45% previously, 20% in 06/2009; h/o clinical congestive heart failure; negative stress nuclear in 2009 with inferoseptal and apical scar  . Anemia     Hgb of 9-10  . Hyperlipidemia   . Breast cancer ([place])   . Hypertension   . Automatic implantable cardioverter-defibrillator in situ   . HOH (hard of hearing)   . Anemia of chronic disease     Hgb of 9-10 chronically; 06/2010: H&H-10.7/33.5, MCV-81, normal iron studies in 2010     Past Surgical History  Procedure Laterality Date  . Total knee arthroplasty Right     Dr.Harrison  . Bi-ventricular implantable cardioverter defibrillator  (crt-d)  07/28/2012  . Cataract extraction w/ intraocular lens implant Left   . Tubal ligation    . Breast biopsy Bilateral   . Mastectomy Left 1998  . Cataract extraction  w/phaco Right 09/15/2013    Procedure: CATARACT EXTRACTION PHACO AND INTRAOCULAR LENS PLACEMENT (IOC);  Surgeon: Elta Guadeloupe T. Gershon Crane, MD;  Location: [facility];  Service: Ophthalmology;  Laterality: Right;  CDE:  10.74  . Bi-ventricular implantable cardioverter defibrillator N/A 07/28/2012    Procedure: BI-VENTRICULAR IMPLANTABLE CARDIOVERTER DEFIBRILLATOR  (CRT-D);  Surgeon: Evans Lance, MD;  Location: [facility] CATH LAB;  Service: Cardiovascular;  Laterality: N/A;  . Colonoscopy N/A 03/04/2015    Procedure: COLONOSCOPY;  Surgeon: Rogene Houston, MD;  Location: [facility];  Service: Endoscopy;  Laterality: N/A;  10:50   . Esophagogastroduodenoscopy N/A 03/04/2015    Procedure: ESOPHAGOGASTRODUODENOSCOPY (EGD);  Surgeon: Rogene Houston, MD;  Location: [facility];  Service: Endoscopy;  Laterality: N/A;     Current Outpatient Prescriptions  Medication Sig Dispense Refill  . acetaminophen (TYLENOL) 500 MG tablet Take 1,000 mg by mouth every 6 (six) hours as needed.     [name] aspirin 325 MG EC tablet Take 325 mg by mouth 2 (two) times daily before a meal.     . carvedilol (COREG) 6.25 MG tablet TAKE 1 TABLET BY MOUTH TWICE A DAY WITH MEALS 60 tablet 6  . furosemide (LASIX) 40 MG tablet TAKE 1 TABLET BY MOUTH TWICE A DAY 60 tablet 6  . hydroxychloroquine (PLAQUENIL) 200 MG tablet Take 100-200 mg by mouth 2 (two) times daily. Takes a whole tablet in the morning and half a tablet in the evening .    [name]  KLOR-CON M10 10 MEQ tablet TAKE 1 TABLET (10 MEQ TOTAL) BY MOUTH 2 (TWO) TIMES DAILY. 60 tablet 6  . lisinopril (PRINIVIL,ZESTRIL) 20 MG tablet Take 1 tablet (20 mg total) by mouth daily. 90 tablet 3  . lovastatin (MEVACOR) 40 MG tablet TAKE 1 TABLET (40 MG TOTAL) BY MOUTH AT BEDTIME. 30 tablet 3  . omeprazole (PRILOSEC) 20 MG capsule Take 20 mg by mouth daily. For gas/heartburn    . PROAIR HFA 108 (90 BASE) MCG/ACT inhaler Inhale 2 puffs into the lungs every 4 (four) hours as needed for wheezing or shortness of  breath. Reported on 05/02/2015     No current facility-administered medications for this visit.    Allergies:   Review of patient's allergies indicates no known allergies.    Social History:  The patient  reports that she quit smoking about 8 years ago. Her smoking use included Cigarettes. She started smoking about 58 years ago. She smoked 0.25 packs per day. Her smokeless tobacco use includes Chew. She reports that she does not drink alcohol or use illicit drugs.   Family History:  The patient's family history includes Cancer in her father; Diabetes in her father; Hypertension in her brother.    ROS: All other systems are reviewed and negative. Unless otherwise mentioned in H&P    PHYSICAL EXAM: VS:  BP 132/60 mmHg  Pulse 92  Ht 5\' 4"  (1.626 m)  Wt 147 lb (66.679 kg)  BMI 25.22 kg/m2  SpO2 95% , BMI Body mass index is 25.22 kg/(m^2). GEN: Well nourished, well developed, in no acute distress HEENT: normal Neck: no JVD, carotid bruits, or masses Cardiac: RRR; 1/6 systolic murmurs, rubs, or gallops,no edema  Respiratory:  clear to auscultation bilaterally, normal work of breathing GI: soft, nontender, nondistended, + BS MS: no deformity or atrophy Skin: warm and dry, no rash Neuro:  Strength and sensation are intact Psych: euthymic mood, full affect.  Easily tearful.   Recent Labs: 08/28/2014: B Natriuretic Peptide 1300.0* 09/17/2014: Magnesium 2.0 06/13/2015: ALT 10*; BUN 26*; Creatinine, Ser 1.04*; Potassium 3.8; Sodium 143 06/24/2015: Hemoglobin 9.4*; Platelets 281    Lipid Panel    Component Value Date/Time   CHOL 134 05/23/2012 0905   TRIG 103 05/23/2012 0905   HDL 30* 05/23/2012 0905   CHOLHDL 4.5 05/23/2012 0905   VLDL 21 05/23/2012 0905   LDLCALC 83 05/23/2012 0905      Wt Readings from Last 3 Encounters:  06/28/15 147 lb (66.679 kg)  06/24/15 147 lb (66.679 kg)  05/30/15 148 lb 6.4 oz (67.314 kg)      Other studies Reviewed: Additional studies/ records  that were reviewed today include: echocardiogram Review of the above records demonstrates: LVEF of 30-35% with severe global hypokinesis in the inferior and lateral akinesis.  Moderately elevated.  LV filling pressure.   ASSESSMENT AND PLAN:  1. Nonischemic cardiomyopathy: We will repeat echocardiogram, as it has been over 2 years since having 1 completed to evaluate her LV systolic function.  She continues to complain.  Overall severe fatigue.  Her kidney function is stable, but she is on a diuretics.  She states she was told to drink more water.  We may be able to reduce her Lasix if her LV function is improved.  We will reevaluate and follow up concerning this.  In the interim, the patient will continue on carvedilol 6.25 mg twice a day, lisinopril.  2. Hypercholesterolemia:she will continue on statin therapy.  3. Chronic anemia:she continues to  be followed by oncology with frequent labs.  Will defer to them for treatment.    Current medicines are reviewed at length with the patient today.    Labs/ tests ordered today include: echocardiogram No orders of the defined types were placed in this encounter.     Disposition:   FU with 3 months.  Signed, Jory Sims, NP  06/28/2015 2:28 PM    [facility] [address], [place], [place] [address] Phone: [phone]; Fax: [phone]

## 2015-06-28 NOTE — Progress Notes (Deleted)
Name: [name]    DOB: [birth year]  Age: 75 y.o.  MR#: [id number]       PCP:  [name], MD      Insurance: Payor: [employer] MEDICARE / Plan: [facility] MEDICARE / Product Type: *No Product type* /   CC:   No chief complaint on file.   VS Filed Vitals:   06/28/15 1425  BP: 132/60  Pulse: 92  Height: 5\' 4"  (1.626 m)  Weight: 147 lb (66.679 kg)  SpO2: 95%    Weights Current Weight  06/28/15 147 lb (66.679 kg)  06/24/15 147 lb (66.679 kg)  05/30/15 148 lb 6.4 oz (67.314 kg)    Blood Pressure  BP Readings from Last 3 Encounters:  06/28/15 132/60  06/24/15 141/49  05/30/15 136/58     Admit date:  (Not on file) Last encounter with RMR:  05/29/2015   Allergy Review of patient's allergies indicates no known allergies.  Current Outpatient Prescriptions  Medication Sig Dispense Refill  . acetaminophen (TYLENOL) 500 MG tablet Take 1,000 mg by mouth every 6 (six) hours as needed.     [name] aspirin 325 MG EC tablet Take 325 mg by mouth 2 (two) times daily before a meal.     . carvedilol (COREG) 6.25 MG tablet TAKE 1 TABLET BY MOUTH TWICE A DAY WITH MEALS 60 tablet 6  . furosemide (LASIX) 40 MG tablet TAKE 1 TABLET BY MOUTH TWICE A DAY 60 tablet 6  . hydroxychloroquine (PLAQUENIL) 200 MG tablet Take 100-200 mg by mouth 2 (two) times daily. Takes a whole tablet in the morning and half a tablet in the evening .    [name] KLOR-CON M10 10 MEQ tablet TAKE 1 TABLET (10 MEQ TOTAL) BY MOUTH 2 (TWO) TIMES DAILY. 60 tablet 6  . lisinopril (PRINIVIL,ZESTRIL) 20 MG tablet Take 1 tablet (20 mg total) by mouth daily. 90 tablet 3  . lovastatin (MEVACOR) 40 MG tablet TAKE 1 TABLET (40 MG TOTAL) BY MOUTH AT BEDTIME. 30 tablet 3  . omeprazole (PRILOSEC) 20 MG capsule Take 20 mg by mouth daily. For gas/heartburn    . PROAIR HFA 108 (90 BASE) MCG/ACT inhaler Inhale 2 puffs into the lungs every 4 (four) hours as needed for wheezing or shortness of breath. Reported on 05/02/2015     No current  facility-administered medications for this visit.    Discontinued Meds:   There are no discontinued medications.  Patient Active Problem List   Diagnosis Date Noted  . CAD S/P remote PCI- no details 09/02/2014  . Cardiomyopathy, ischemic-EF 30-35% March 2015 09/02/2014  . Diastolic dysfunction-grade 2 09/02/2014  . CHF exacerbation ([place]) 08/28/2014  . Acute on chronic combined systolic and diastolic congestive heart failure ([place]) 08/28/2014  . Iron deficiency anemia 08/20/2013  . Malnutrition of moderate degree ([place]) 07/21/2013  . PNA (pneumonia) 07/19/2013  . HCAP (healthcare-associated pneumonia) 07/17/2013  . Sepsis ([place]) 07/17/2013  . ARF (acute renal failure) ([place]) 07/17/2013  . Rheumatoid arthritis ([place]) 07/04/2013  . Hypokalemia 07/03/2013  . Pain and swelling of wrist 07/03/2013  . Chest pain 07/03/2013  . Biventricular ICD in place (MDT 2014) 11/06/2012  . Anemia of chronic disease   . Breast cancer ([place])   . Hypertension   . Dyslipidemia 05/24/2009  . Chronic systolic heart failure (HCC) 05/24/2009    LABS    Component Value Date/Time   NA 143 06/13/2015 1147   NA 138 02/03/2015 1027   NA 139 09/17/2014 1347   K 3.8  06/13/2015 1147   K 4.2 02/03/2015 1027   K 4.5 09/17/2014 1347   CL 105 06/13/2015 1147   CL 102 02/03/2015 1027   CL 102 09/17/2014 1347   CO2 25 06/13/2015 1147   CO2 27 02/03/2015 1027   CO2 23 09/17/2014 1347   GLUCOSE 96 06/13/2015 1147   GLUCOSE 101* 02/03/2015 1027   GLUCOSE 98 09/17/2014 1347   BUN 26* 06/13/2015 1147   BUN 19 02/03/2015 1027   BUN 65* 09/17/2014 1347   CREATININE 1.04* 06/13/2015 1147   CREATININE 1.09* 02/03/2015 1027   CREATININE 1.23* 09/17/2014 1347   CREATININE 1.18* 09/06/2014 0609   CREATININE 0.76 09/11/2013 1005   CREATININE 0.90 07/14/2012 1330   CALCIUM 9.1 06/13/2015 1147   CALCIUM 8.9 02/03/2015 1027   CALCIUM 9.1 09/17/2014 1347   GFRNONAA 51* 06/13/2015 1147   GFRNONAA 48* 02/03/2015 1027    GFRNONAA 44* 09/06/2014 0609   GFRAA 59* 06/13/2015 1147   GFRAA 56* 02/03/2015 1027   GFRAA 51* 09/06/2014 0609   CMP     Component Value Date/Time   NA 143 06/13/2015 1147   K 3.8 06/13/2015 1147   CL 105 06/13/2015 1147   CO2 25 06/13/2015 1147   GLUCOSE 96 06/13/2015 1147   BUN 26* 06/13/2015 1147   CREATININE 1.04* 06/13/2015 1147   CREATININE 1.23* 09/17/2014 1347   CALCIUM 9.1 06/13/2015 1147   PROT 8.0 06/13/2015 1147   ALBUMIN 3.3* 06/13/2015 1147   AST 16 06/13/2015 1147   ALT 10* 06/13/2015 1147   ALKPHOS 109 06/13/2015 1147   BILITOT 0.3 06/13/2015 1147   GFRNONAA 51* 06/13/2015 1147   GFRAA 59* 06/13/2015 1147       Component Value Date/Time   WBC 6.1 06/24/2015 1236   WBC 5.1 06/13/2015 1147   WBC 5.1 05/30/2015 1250   HGB 9.4* 06/24/2015 1236   HGB 9.2* 06/13/2015 1147   HGB 8.9* 05/30/2015 1250   HCT 30.4* 06/24/2015 1236   HCT 29.7* 06/13/2015 1147   HCT 28.5* 05/30/2015 1250   MCV 83.7 06/24/2015 1236   MCV 84.6 06/13/2015 1147   MCV 83.6 05/30/2015 1250    Lipid Panel     Component Value Date/Time   CHOL 134 05/23/2012 0905   TRIG 103 05/23/2012 0905   HDL 30* 05/23/2012 0905   CHOLHDL 4.5 05/23/2012 0905   VLDL 21 05/23/2012 0905   LDLCALC 83 05/23/2012 0905    ABG No results found for: PHART, PCO2ART, PO2ART, HCO3, TCO2, ACIDBASEDEF, O2SAT   Lab Results  Component Value Date   TSH 0.501 07/03/2013   BNP (last 3 results)  Recent Labs  08/28/14 1608  BNP 1300.0*    ProBNP (last 3 results) No results for input(s): PROBNP in the last 8760 hours.  Cardiac Panel (last 3 results) No results for input(s): CKTOTAL, CKMB, TROPONINI, RELINDX in the last 72 hours.  Iron/TIBC/Ferritin/ %Sat    Component Value Date/Time   IRON 31 02/17/2015 1512   TIBC 185* 02/17/2015 1512   FERRITIN 356* 03/31/2015 0853   IRONPCTSAT 17 02/17/2015 1512     EKG Orders placed or performed during the hospital encounter of 08/28/14  . ED EKG  .  ED EKG  . EKG 12-Lead  . EKG 12-Lead  . EKG     Prior Assessment and Plan Problem List as of 06/28/2015      Cardiovascular and Mediastinum   Chronic systolic heart failure [facility])   Last Assessment & Plan 09/10/2014  Office Visit Written 09/10/2014  9:49 AM by Evans Lance, MD    Her chronic systolic heart failure is well compensated. She will continue her current meds. Her fluid index is normal.       Hypertension   Last Assessment & Plan 09/10/2014 Office Visit Written 09/10/2014  9:49 AM by Evans Lance, MD    Her blood pressure is well controlled. No change in meds.       CAD S/P remote PCI- no details   Cardiomyopathy, ischemic-EF 30-35% March 2015   CHF exacerbation (HCC)   Acute on chronic combined systolic and diastolic congestive heart failure (HCC)     Respiratory   HCAP (healthcare-associated pneumonia)   PNA (pneumonia)     Musculoskeletal and Integument   Rheumatoid arthritis [facility])   Last Assessment & Plan 05/30/2015 Office Visit Edited 05/30/2015  6:57 PM by Baird Cancer, PA-C    She is on Plaquenil daily.  Her rheumatologist is Dr. Estanislado Pandy and she has an upcoming appointment on 06/09/2015.        Genitourinary   ARF (acute renal failure) ([place])     Other   Dyslipidemia   Last Assessment & Plan 06/09/2012 Office Visit Edited 06/09/2012  5:53 PM by Yehuda Savannah, MD    Acceptable lipid profile last year, but as long she requires treatment with a statin, a higher dose will produce even a more favorable effect on her serum lipids and will be ordered.      Anemia of chronic disease   Last Assessment & Plan 05/30/2015 Office Visit Edited 05/30/2015  6:58 PM by Baird Cancer, PA-C    Iron deficiency anemia in the setting of Stage II chronic renal disease, requiring infrequent IV replacement, and rheumatoid arthritis, on Plaquenil.  S/P EGD/colonoscopy by Dr. Laural Golden on 03/04/2015 demonstrating a small AVM in duodenum, sub-optimal prep on colonoscopy with small  polyps which could have hindered the ability to identify AVMs. No camera capsule study at this time.  Labs today: CBC diff.  UA added today for possible UTI complaints.  UA is negative for concerning findings suspicious for UTI.  Labs in 4 weeks: CBC diff  Return in 4 weeks for follow-up.      Biventricular ICD in place (MDT 2014)   Last Assessment & Plan 09/10/2014 Office Visit Written 09/10/2014  9:50 AM by Evans Lance, MD    Her medtronic biv ICD is working normally. Will recheck in several months.      Diastolic dysfunction-grade 2   Breast cancer (HCC)   Hypokalemia   Pain and swelling of wrist   Chest pain   Sepsis (HCC)   Malnutrition of moderate degree (HCC)   Iron deficiency anemia   Last Assessment & Plan 06/03/2014 Office Visit Written 06/21/2014  6:17 PM by Molli Hazard, MD    75 year old female who presented with microcytic anemia and iron deficiency. In spite of iron replacement she remains quite anemic. She has evidence of only mild chronic kidney disease. Fecal occult blood was negative back in March 2015. There is no recent colonoscopy or EGD documented in Epic. I recommended a more extensive anemia evaluation to which she agrees. We will draw additional laboratory studies today and I will keep her apprised results when available. If all of her laboratory studies are normal we will discuss additional valuation at her follow-up when she returns in 2 months.          Imaging: Dg Chest 2  View  06/13/2015  CLINICAL DATA:  Immunosuppressant therapy. EXAM: CHEST  2 VIEW COMPARISON:  09/02/2014 and 01/11/2014 FINDINGS: There is chronic cardiomegaly. AICD in place. Calcification in the thoracic aorta. Chronic interstitial lung disease most prominent posteriorly at the lung bases, stable. No acute infiltrates or effusions. Slight chronic accentuation of the thoracic kyphosis. IMPRESSION: No acute abnormality. Chronic cardiomegaly. Chronic interstitial lung disease,  stable. Electronically Signed   By: Lorriane Shire M.D.   On: 06/13/2015 14:15

## 2015-06-28 NOTE — Patient Instructions (Signed)
Your physician recommends that you schedule a follow-up appointment in:  3 months with Arnold Long NP  DECREASE Aspirin to 81 mg daily   Your physician has requested that you have an echocardiogram. Echocardiography is a painless test that uses sound waves to create images of your heart. It provides your doctor with information about the size and shape of your heart and how well your heart's chambers and valves are working. This procedure takes approximately one hour. There are no restrictions for this procedure.     If you need a refill on your cardiac medications before your next appointment, please call your pharmacy.    Thank you for choosing [facility] !

## 2015-07-01 ENCOUNTER — Ambulatory Visit (HOSPITAL_COMMUNITY)
Admission: RE | Admit: 2015-07-01 | Discharge: 2015-07-01 | Disposition: A | Discharge status: Home / Self Care | Payer: Medicare Other | Source: Ambulatory Visit | Attending: Adult Health | Admitting: Adult Health

## 2015-07-01 DIAGNOSIS — I071 Rheumatic tricuspid insufficiency: Secondary | ICD-10-CM | POA: Insufficient documentation

## 2015-07-01 DIAGNOSIS — I110 Hypertensive heart disease with heart failure: Secondary | ICD-10-CM | POA: Diagnosis not present

## 2015-07-01 DIAGNOSIS — R29898 Other symptoms and signs involving the musculoskeletal system: Secondary | ICD-10-CM | POA: Diagnosis not present

## 2015-07-01 DIAGNOSIS — Z87891 Personal history of nicotine dependence: Secondary | ICD-10-CM | POA: Diagnosis not present

## 2015-07-01 DIAGNOSIS — E785 Hyperlipidemia, unspecified: Secondary | ICD-10-CM | POA: Diagnosis not present

## 2015-07-01 DIAGNOSIS — I340 Nonrheumatic mitral (valve) insufficiency: Secondary | ICD-10-CM | POA: Insufficient documentation

## 2015-07-01 DIAGNOSIS — I5022 Chronic systolic (congestive) heart failure: Secondary | ICD-10-CM | POA: Diagnosis present

## 2015-07-01 DIAGNOSIS — I358 Other nonrheumatic aortic valve disorders: Secondary | ICD-10-CM | POA: Insufficient documentation

## 2015-07-01 LAB — ECHOCARDIOGRAM COMPLETE
AORTIC ROOT 2D: 26 mm
CHL CUP ESTIMATED CVP: 3 mmHg
CHL CUP LA SIZE INDEX: 2.4 mm/m2
CHL CUP LA VOL 2D: 52 mL
CHL CUP MV DEC (S): 162 ms
E/e' ratio: 15.44
EWDT: 162 ms
FS: 10 % — AB (ref 28–44)
IVS/LV PW RATIO, ED: 0.75
LA VOL 2D INDEX: 29.8 mL/m2
LA diam end sys: 42 mm
LA vol index: 29.2 mL/m2
LA vol: 51.1 mL
LASIZE: 42 mm
LV PW s: 14 mm
LV TDI E'MEDIAL: 6.09 cm/s
LVIDD: 44.5 mm — AB (ref 3.5–6.0)
LVIDS: 40 mm — AB (ref 2.1–4.0)
MV Peak grad: 4 mmHg
MV pk A vel: 106 cm/s
MV pk E vel: 97.4 cm/s
PW: 14 mm — AB (ref 0.6–1.1)
TAPSE: 29 mm
TDI e' lateral: 6.31 cm/s
TRMAXVEL: 246 cm/s
TV PEAK RV-RA GRADIENT: 24 cm/s

## 2015-07-01 NOTE — Progress Notes (Signed)
*  PRELIMINARY RESULTS* Echocardiogram 2D Echocardiogram has been performed.  Leavy Cella 07/01/2015, 2:43 PM

## 2015-07-08 ENCOUNTER — Ambulatory Visit (INDEPENDENT_AMBULATORY_CARE_PROVIDER_SITE_OTHER): Payer: Medicare Other | Admitting: *Deleted

## 2015-07-08 DIAGNOSIS — I255 Ischemic cardiomyopathy: Secondary | ICD-10-CM | POA: Diagnosis not present

## 2015-07-08 DIAGNOSIS — I5022 Chronic systolic (congestive) heart failure: Secondary | ICD-10-CM

## 2015-07-08 DIAGNOSIS — Z95810 Presence of automatic (implantable) cardiac defibrillator: Secondary | ICD-10-CM | POA: Diagnosis not present

## 2015-07-08 NOTE — Progress Notes (Signed)
Remote ICD transmission.   

## 2015-07-11 NOTE — Progress Notes (Addendum)
EPIC Encounter for ICM Monitoring  Patient Name: [name] is a 75 y.o. female Date: 07/11/2015 Primary Care Physican: [name], MD Primary Cardiologist: [name] Electrophysiologist: Lovena Le Dry Weight: 134 lb approximately   Bi-V Pacing 97.1%      In the past month, have you:  1. Gained more than 2 pounds in a day or more than 5 pounds in a week? no  2. Had changes in your medications (with verification of current medications)? no  3. Had more shortness of breath than is usual for you? no  4. Limited your activity because of shortness of breath? no  5. Not been able to sleep because of shortness of breath? no  6. Had increased swelling in your feet or ankles? no  7. Had symptoms of dehydration (dizziness, dry mouth, increased thirst, decreased urine output) no  8. Had changes in sodium restriction? no  9. Been compliant with medication? Yes   ICM trend: 3 month view for 07/08/2015   ICM trend: 1 year view for 07/08/2015   Follow-up plan: ICM clinic phone appointment on 08/11/2015.   Thoracic impedance trending along reference line.  No fluid symptoms per nephew.  She is doing well at this time.    Encouraged to call for any fluid symptoms.  No changes today.    [name], RN, CCM 07/11/2015 9:33 AM

## 2015-07-19 ENCOUNTER — Other Ambulatory Visit (HOSPITAL_COMMUNITY): Payer: Self-pay | Admitting: Internal Medicine

## 2015-07-19 DIAGNOSIS — Z780 Asymptomatic menopausal state: Secondary | ICD-10-CM

## 2015-07-19 DIAGNOSIS — M1990 Unspecified osteoarthritis, unspecified site: Secondary | ICD-10-CM

## 2015-07-20 ENCOUNTER — Institutional Professional Consult (permissible substitution): Payer: Medicare Other | Admitting: Pulmonary Disease

## 2015-07-22 ENCOUNTER — Encounter (HOSPITAL_COMMUNITY): Payer: Medicare Other

## 2015-07-22 ENCOUNTER — Ambulatory Visit (HOSPITAL_COMMUNITY)
Admission: RE | Admit: 2015-07-22 | Discharge: 2015-07-22 | Disposition: A | Discharge status: Home / Self Care | Payer: Medicare Other | Source: Ambulatory Visit | Attending: Internal Medicine | Admitting: Internal Medicine

## 2015-07-22 DIAGNOSIS — M8580 Other specified disorders of bone density and structure, unspecified site: Secondary | ICD-10-CM | POA: Diagnosis not present

## 2015-07-22 DIAGNOSIS — D509 Iron deficiency anemia, unspecified: Secondary | ICD-10-CM

## 2015-07-22 DIAGNOSIS — D649 Anemia, unspecified: Secondary | ICD-10-CM | POA: Diagnosis not present

## 2015-07-22 DIAGNOSIS — M1990 Unspecified osteoarthritis, unspecified site: Secondary | ICD-10-CM | POA: Diagnosis not present

## 2015-07-22 DIAGNOSIS — Z780 Asymptomatic menopausal state: Secondary | ICD-10-CM | POA: Diagnosis not present

## 2015-07-22 LAB — CBC WITH DIFFERENTIAL/PLATELET
BASOS ABS: 0 10*3/uL (ref 0.0–0.1)
BASOS PCT: 1 %
EOS ABS: 0.1 10*3/uL (ref 0.0–0.7)
Eosinophils Relative: 2 %
HCT: 29.2 % — ABNORMAL LOW (ref 36.0–46.0)
HEMOGLOBIN: 9.2 g/dL — AB (ref 12.0–15.0)
LYMPHS ABS: 1.6 10*3/uL (ref 0.7–4.0)
Lymphocytes Relative: 29 %
MCH: 25.5 pg — AB (ref 26.0–34.0)
MCHC: 31.5 g/dL (ref 30.0–36.0)
MCV: 80.9 fL (ref 78.0–100.0)
Monocytes Absolute: 1 10*3/uL (ref 0.1–1.0)
Monocytes Relative: 17 %
NEUTROS PCT: 51 %
Neutro Abs: 2.9 10*3/uL (ref 1.7–7.7)
Platelets: 268 10*3/uL (ref 150–400)
RBC: 3.61 MIL/uL — AB (ref 3.87–5.11)
RDW: 15.2 % (ref 11.5–15.5)
WBC: 5.6 10*3/uL (ref 4.0–10.5)

## 2015-07-28 LAB — CUP PACEART REMOTE DEVICE CHECK
Brady Statistic AP VS Percent: 1.07 %
Brady Statistic AS VP Percent: 80.3 %
Brady Statistic AS VS Percent: 2.09 %
Brady Statistic RV Percent Paced: 1.19 %
HighPow Impedance: 79 Ohm
Implantable Lead Implant Date: 20140407
Implantable Lead Implant Date: 20140407
Implantable Lead Location: 753858
Implantable Lead Location: 753860
Implantable Lead Model: 4194
Implantable Lead Model: 5076
Lead Channel Impedance Value: 380 Ohm
Lead Channel Impedance Value: 380 Ohm
Lead Channel Impedance Value: 646 Ohm
Lead Channel Pacing Threshold Amplitude: 0.5 V
Lead Channel Pacing Threshold Pulse Width: 0.4 ms
Lead Channel Sensing Intrinsic Amplitude: 2.75 mV
Lead Channel Sensing Intrinsic Amplitude: 29.875 mV
Lead Channel Setting Pacing Amplitude: 2 V
Lead Channel Setting Pacing Pulse Width: 0.5 ms
MDC IDC LEAD IMPLANT DT: 20140407
MDC IDC LEAD LOCATION: 753859
MDC IDC LEAD MODEL: 6935
MDC IDC MSMT BATTERY REMAINING LONGEVITY: 81 mo
MDC IDC MSMT BATTERY VOLTAGE: 2.99 V
MDC IDC MSMT LEADCHNL LV IMPEDANCE VALUE: 855 Ohm
MDC IDC MSMT LEADCHNL LV PACING THRESHOLD AMPLITUDE: 0.5 V
MDC IDC MSMT LEADCHNL LV PACING THRESHOLD PULSEWIDTH: 0.5 ms
MDC IDC MSMT LEADCHNL RA IMPEDANCE VALUE: 399 Ohm
MDC IDC MSMT LEADCHNL RA SENSING INTR AMPL: 2.75 mV
MDC IDC MSMT LEADCHNL RV IMPEDANCE VALUE: 494 Ohm
MDC IDC MSMT LEADCHNL RV PACING THRESHOLD AMPLITUDE: 1.25 V
MDC IDC MSMT LEADCHNL RV PACING THRESHOLD PULSEWIDTH: 0.4 ms
MDC IDC MSMT LEADCHNL RV SENSING INTR AMPL: 29.875 mV
MDC IDC SESS DTM: 20170317041607
MDC IDC SET LEADCHNL LV PACING AMPLITUDE: 1.5 V
MDC IDC SET LEADCHNL RV SENSING SENSITIVITY: 0.3 mV
MDC IDC STAT BRADY AP VP PERCENT: 16.54 %
MDC IDC STAT BRADY RA PERCENT PACED: 17.61 %

## 2015-08-02 ENCOUNTER — Encounter: Payer: Self-pay | Admitting: Cardiology

## 2015-08-11 ENCOUNTER — Ambulatory Visit (INDEPENDENT_AMBULATORY_CARE_PROVIDER_SITE_OTHER): Payer: Medicare Other

## 2015-08-11 DIAGNOSIS — Z95810 Presence of automatic (implantable) cardiac defibrillator: Secondary | ICD-10-CM | POA: Diagnosis not present

## 2015-08-11 DIAGNOSIS — I5022 Chronic systolic (congestive) heart failure: Secondary | ICD-10-CM | POA: Diagnosis not present

## 2015-08-11 NOTE — Progress Notes (Signed)
EPIC Encounter for ICM Monitoring  Patient Name: [name] is a 75 y.o. female Date: 08/11/2015 Primary Care Physican: [name], MD Primary Cardiologist: [name] Electrophysiologist: Lovena Le Dry Weight: 135 lb   Bi-V Pacing 92.9%      In the past month, have you:  1. Gained more than 2 pounds in a day or more than 5 pounds in a week? no  2. Had changes in your medications (with verification of current medications)? no  3. Had more shortness of breath than is usual for you? no  4. Limited your activity because of shortness of breath? no  5. Not been able to sleep because of shortness of breath? no  6. Had increased swelling in your feet or ankles? no  7. Had symptoms of dehydration (dizziness, dry mouth, increased thirst, decreased urine output) no  8. Had changes in sodium restriction? no  9. Been compliant with medication? Yes   ICM trend: 3 month view for 08/11/2015   ICM trend: 1 year view for 08/11/2015   Follow-up plan: ICM clinic phone appointment on 09/12/2015.   Spoke with patient and she confirmed can continue to speak with her nephew [name] regarding her health.  Thoracic impedance below reference line from 07/23/2015 to 08/04/2015 suggesting fluid accumulation and returned to reference line 08/04/2015.  Patient denied fluid symptoms.    Encouraged to call for any fluid symptoms.  No changes today.     [name], RN, CCM 08/11/2015 11:01 AM

## 2015-08-19 ENCOUNTER — Other Ambulatory Visit: Payer: Self-pay | Admitting: Adult Health

## 2015-08-19 ENCOUNTER — Encounter (HOSPITAL_COMMUNITY): Payer: Medicare Other | Attending: Hematology & Oncology

## 2015-08-19 ENCOUNTER — Other Ambulatory Visit (HOSPITAL_COMMUNITY)
Admission: RE | Admit: 2015-08-19 | Discharge: 2015-08-19 | Disposition: A | Discharge status: Home / Self Care | Payer: Medicare Other | Source: Ambulatory Visit | Attending: Rheumatology | Admitting: Rheumatology

## 2015-08-19 DIAGNOSIS — D649 Anemia, unspecified: Secondary | ICD-10-CM | POA: Diagnosis present

## 2015-08-19 DIAGNOSIS — Z113 Encounter for screening for infections with a predominantly sexual mode of transmission: Secondary | ICD-10-CM | POA: Diagnosis present

## 2015-08-19 DIAGNOSIS — D509 Iron deficiency anemia, unspecified: Secondary | ICD-10-CM

## 2015-08-19 LAB — CBC WITH DIFFERENTIAL/PLATELET
Basophils Absolute: 0 10*3/uL (ref 0.0–0.1)
Basophils Relative: 1 %
EOS ABS: 0.2 10*3/uL (ref 0.0–0.7)
Eosinophils Relative: 4 %
HEMATOCRIT: 29 % — AB (ref 36.0–46.0)
HEMOGLOBIN: 9.1 g/dL — AB (ref 12.0–15.0)
LYMPHS ABS: 1.3 10*3/uL (ref 0.7–4.0)
Lymphocytes Relative: 26 %
MCH: 24.7 pg — AB (ref 26.0–34.0)
MCHC: 31.4 g/dL (ref 30.0–36.0)
MCV: 78.8 fL (ref 78.0–100.0)
MONOS PCT: 14 %
Monocytes Absolute: 0.7 10*3/uL (ref 0.1–1.0)
NEUTROS ABS: 2.9 10*3/uL (ref 1.7–7.7)
NEUTROS PCT: 55 %
Platelets: 238 10*3/uL (ref 150–400)
RBC: 3.68 MIL/uL — AB (ref 3.87–5.11)
RDW: 15.7 % — ABNORMAL HIGH (ref 11.5–15.5)
WBC: 5.1 10*3/uL (ref 4.0–10.5)

## 2015-08-20 LAB — HIV ANTIBODY (ROUTINE TESTING W REFLEX): HIV SCREEN 4TH GENERATION: NONREACTIVE

## 2015-08-26 ENCOUNTER — Encounter: Payer: Self-pay | Admitting: Pulmonary Disease

## 2015-08-26 ENCOUNTER — Ambulatory Visit (INDEPENDENT_AMBULATORY_CARE_PROVIDER_SITE_OTHER): Payer: Medicare Other | Admitting: Pulmonary Disease

## 2015-08-26 VITALS — BP 120/54 | HR 83 | Ht 64.0 in | Wt 145.6 lb

## 2015-08-26 DIAGNOSIS — J849 Interstitial pulmonary disease, unspecified: Secondary | ICD-10-CM

## 2015-08-26 NOTE — Progress Notes (Signed)
Subjective:    Patient ID: [name], female    DOB: Mar 09, 1941, 75 y.o.   MRN: [id number]  HPI Consult for evaluation of interstitial lung disease. Pulmonary eval before starting methotrexate.  [name] is a 75 year old with history of coronary vascular disease, AICD, ischemic cardiomyopathy [EF 30-35%]. She has a recent diagnosis of rheumatoid arthritis. She is being evaluated by Dr. Estanislado Pandy and the plan is to start methotrexate. She had a chest x-ray that showed chronic interstitial abnormalities. She is referred to [facility] for further evaluation and clearance before starting methotrexate.  She is hard of hearing and is here today with her nephew who explains things to her. She has some joint symptoms but denies any respiratory complaints. She denies any cough, sputum production, wheezing, hemoptysis.  DATA: Echocardiogram 07/01/15 EF 30-35%, diffuse hypokinesis. Grade 1 diastolic dysfunction PAP 27  Chest x-ray 06/13/15 Cardiomegaly, chronic interstitial lung disease. Images reviewed  Social History: She does not smoke but chews tobacco occasionally. No alcohol or drug use. She is to work in the [employer] and is currently retired.  Family History: Father-diabetes, pancreatic cancer Brother-hypertension  Past Medical History  Diagnosis Date  . Arteriosclerotic cardiovascular disease (ASCVD)     Remote PTCA by patient report; LBBB; associated cardiomyopathy, presumed ischemic with EF 40-45% previously, 20% in 06/2009; h/o clinical congestive heart failure; negative stress nuclear in 2009 with inferoseptal and apical scar  . Anemia     Hgb of 9-10  . Hyperlipidemia   . Breast cancer ([place])   . Hypertension   . Automatic implantable cardioverter-defibrillator in situ   . HOH (hard of hearing)   . Anemia of chronic disease     Hgb of 9-10 chronically; 06/2010: H&H-10.7/33.5, MCV-81, normal iron studies in 2010     Current outpatient prescriptions:  .  acetaminophen  (TYLENOL) 500 MG tablet, Take 1,000 mg by mouth every 6 (six) hours as needed. , Disp: , Rfl:  .  aspirin EC 81 MG tablet, Take 81 mg by mouth daily., Disp: , Rfl:  .  carvedilol (COREG) 6.25 MG tablet, TAKE 1 TABLET BY MOUTH TWICE A DAY WITH MEALS, Disp: 60 tablet, Rfl: 6 .  furosemide (LASIX) 40 MG tablet, TAKE 1 TABLET BY MOUTH TWICE A DAY, Disp: 60 tablet, Rfl: 6 .  hydroxychloroquine (PLAQUENIL) 200 MG tablet, Take 100-200 mg by mouth 2 (two) times daily. Takes a whole tablet in the morning and half a tablet in the evening ., Disp: , Rfl:  .  KLOR-CON M10 10 MEQ tablet, TAKE 1 TABLET (10 MEQ TOTAL) BY MOUTH 2 (TWO) TIMES DAILY., Disp: 60 tablet, Rfl: 6 .  lisinopril (PRINIVIL,ZESTRIL) 20 MG tablet, Take 1 tablet (20 mg total) by mouth daily., Disp: 90 tablet, Rfl: 3 .  lovastatin (MEVACOR) 40 MG tablet, TAKE 1 TABLET (40 MG TOTAL) BY MOUTH AT BEDTIME., Disp: 30 tablet, Rfl: 6 .  omeprazole (PRILOSEC) 20 MG capsule, Take 20 mg by mouth daily. For gas/heartburn, Disp: , Rfl:  .  PROAIR HFA 108 (90 BASE) MCG/ACT inhaler, Inhale 2 puffs into the lungs every 4 (four) hours as needed for wheezing or shortness of breath. Reported on 08/26/2015, Disp: , Rfl:   Review of Systems Denies any dyspnea cough, wheezing, sputum production, hemoptysis. Denies any chest pain, palpitation. Denies any nausea, vomiting, diarrhea, constipation. Denies any fevers, chills, loss of weight, loss of appetite. All other review of systems are negative    Objective:   Physical Exam Blood pressure  120/54, pulse 83, height 5\' 4"  (1.626 m), weight 145 lb 9.6 oz (66.044 kg), SpO2 95 %. Gen: No apparent distress Neuro: No gross focal deficits. HEENT: No JVD, lymphadenopathy, thyromegaly. RS: Clear, No wheeze or crackles CVS: S1-S2 heard, no murmurs rubs gallops. Abdomen: Soft, positive bowel sounds. Musculoskeletal: No edema.    Assessment & Plan:  Consult for evaluation of interstitial lung disease. [name] has  a recent diagnosis of rheumatoid arthritis and a chest x-ray that shows chronic interstitial infiltrates. The ling findings may be secondary to her rheumatoid arthritis. She does not have any exposures or any suggestion of hypersensitivity pneumonitis. I will get a high-resolution CT of the chest and pulmonary function test. Depending on the appearance of her infiltrates on the CT she may need a bronchoscopy with BAL, biopsy for further evaluation.  Marshell Garfinkel MD [facility] Pager [phone] If no answer or after 3pm call: [phone] 08/26/2015, 12:01 PM

## 2015-08-26 NOTE — Patient Instructions (Signed)
We will schedule you for high-resolution CT of the chest to evaluate for interstitial lung disease. I will call you after the CT to discuss results and plan for bronchoscope if necessary.  Return to clinic in 1 month.

## 2015-08-26 NOTE — Addendum Note (Signed)
Addended byMarshell Garfinkel on: 08/26/2015 12:03 PM   Modules accepted: Level of Service

## 2015-08-27 ENCOUNTER — Other Ambulatory Visit: Payer: Self-pay | Admitting: Adult Health

## 2015-08-31 ENCOUNTER — Ambulatory Visit (HOSPITAL_COMMUNITY)
Admission: RE | Admit: 2015-08-31 | Discharge: 2015-08-31 | Disposition: A | Discharge status: Home / Self Care | Payer: Medicare Other | Source: Ambulatory Visit | Attending: Pulmonary Disease | Admitting: Pulmonary Disease

## 2015-08-31 DIAGNOSIS — R911 Solitary pulmonary nodule: Secondary | ICD-10-CM | POA: Insufficient documentation

## 2015-08-31 DIAGNOSIS — J479 Bronchiectasis, uncomplicated: Secondary | ICD-10-CM | POA: Diagnosis not present

## 2015-08-31 DIAGNOSIS — R590 Localized enlarged lymph nodes: Secondary | ICD-10-CM | POA: Insufficient documentation

## 2015-08-31 DIAGNOSIS — J849 Interstitial pulmonary disease, unspecified: Secondary | ICD-10-CM | POA: Insufficient documentation

## 2015-09-07 ENCOUNTER — Other Ambulatory Visit: Payer: Self-pay | Admitting: Pulmonary Disease

## 2015-09-07 NOTE — Progress Notes (Signed)
Quick Note:  Called spoke with pt's spouse Juanda Crumble (DPR on file) and discussed CT results / recs as stated by PM. Spouse voiced his understanding. Pt to keep her 6.8.17 ov w/ PM and PFT. Orders only encounter created for follow up CT in May 2018. ______

## 2015-09-12 ENCOUNTER — Ambulatory Visit (INDEPENDENT_AMBULATORY_CARE_PROVIDER_SITE_OTHER): Payer: Medicare Other

## 2015-09-12 DIAGNOSIS — Z95810 Presence of automatic (implantable) cardiac defibrillator: Secondary | ICD-10-CM | POA: Diagnosis not present

## 2015-09-12 DIAGNOSIS — I5022 Chronic systolic (congestive) heart failure: Secondary | ICD-10-CM

## 2015-09-14 NOTE — Progress Notes (Signed)
EPIC Encounter for ICM Monitoring  Patient Name: [name] is a 75 y.o. female Date: 09/14/2015 Primary Care Physican: [name], MD Primary Cardiologist: [name] Electrophysiologist: Allred Dry Weight: 137 lbs   Bi-V Pacing 93.5%      In the past month, have you:  1. Gained more than 2 pounds in a day or more than 5 pounds in a week? no  2. Had changes in your medications (with verification of current medications)? Yes, she is taking Methotrexate 4 tablets weekly but unsure of the dosage.  Also newly prescribed Folic Acid, no dosage.  All prescribed by Rheumatologist.  3. Had more shortness of breath than is usual for you? no  4. Limited your activity because of shortness of breath? no  5. Not been able to sleep because of shortness of breath? no  6. Had increased swelling in your feet, ankles, legs or stomach area? no  7. Had symptoms of dehydration (dizziness, dry mouth, increased thirst, decreased urine output) no  8. Had changes in sodium restriction? no  9. Been compliant with medication? Yes  ICM trend: 3 month view for 09/12/2015   ICM trend: 1 year view for 09/12/2015   Follow-up plan: ICM clinic phone appointment 10/18/2015.  Spoke with nephew [name].    FLUID LEVELS: Optivol thoracic impedance trending along baseline.    SYMPTOMS:  None.  Denied any symptoms such as weight gain of 3 pounds overnight or 5 pounds within a week, SOB and/or lower extremity swelling. Encouraged to call for any fluid symptoms.   EDUCATION: Limit sodium intake to < 2000 mg and fluid intake to 64 oz daily.     RECOMMENDATIONS: No changes today.     [name], RN, CCM 09/14/2015 10:53 AM

## 2015-09-21 ENCOUNTER — Encounter (HOSPITAL_COMMUNITY): Payer: Medicare Other | Attending: Hematology & Oncology

## 2015-09-21 ENCOUNTER — Other Ambulatory Visit (HOSPITAL_COMMUNITY)
Admission: RE | Admit: 2015-09-21 | Discharge: 2015-09-21 | Disposition: A | Discharge status: Home / Self Care | Payer: Medicare Other | Source: Ambulatory Visit | Attending: Pulmonary Disease | Admitting: Pulmonary Disease

## 2015-09-21 ENCOUNTER — Telehealth (HOSPITAL_COMMUNITY): Payer: Self-pay | Admitting: Oncology

## 2015-09-21 ENCOUNTER — Other Ambulatory Visit (HOSPITAL_COMMUNITY): Payer: Self-pay | Admitting: Oncology

## 2015-09-21 ENCOUNTER — Other Ambulatory Visit (HOSPITAL_COMMUNITY)
Admission: RE | Admit: 2015-09-21 | Discharge: 2015-09-21 | Disposition: A | Discharge status: Home / Self Care | Payer: Medicare Other | Source: Ambulatory Visit | Attending: Rheumatology | Admitting: Rheumatology

## 2015-09-21 DIAGNOSIS — Z5181 Encounter for therapeutic drug level monitoring: Secondary | ICD-10-CM | POA: Insufficient documentation

## 2015-09-21 DIAGNOSIS — M069 Rheumatoid arthritis, unspecified: Secondary | ICD-10-CM

## 2015-09-21 DIAGNOSIS — Z79899 Other long term (current) drug therapy: Secondary | ICD-10-CM | POA: Diagnosis not present

## 2015-09-21 DIAGNOSIS — D509 Iron deficiency anemia, unspecified: Secondary | ICD-10-CM

## 2015-09-21 DIAGNOSIS — D649 Anemia, unspecified: Secondary | ICD-10-CM | POA: Insufficient documentation

## 2015-09-21 LAB — CBC WITH DIFFERENTIAL/PLATELET
BASOS ABS: 0 10*3/uL (ref 0.0–0.1)
BASOS PCT: 1 %
Eosinophils Absolute: 0.2 10*3/uL (ref 0.0–0.7)
Eosinophils Relative: 4 %
HEMATOCRIT: 26.5 % — AB (ref 36.0–46.0)
HEMOGLOBIN: 8.3 g/dL — AB (ref 12.0–15.0)
LYMPHS PCT: 34 %
Lymphs Abs: 1.3 10*3/uL (ref 0.7–4.0)
MCH: 24.5 pg — ABNORMAL LOW (ref 26.0–34.0)
MCHC: 31.3 g/dL (ref 30.0–36.0)
MCV: 78.2 fL (ref 78.0–100.0)
MONOS PCT: 8 %
Monocytes Absolute: 0.3 10*3/uL (ref 0.1–1.0)
NEUTROS ABS: 2 10*3/uL (ref 1.7–7.7)
NEUTROS PCT: 53 %
Platelets: 225 10*3/uL (ref 150–400)
RBC: 3.39 MIL/uL — ABNORMAL LOW (ref 3.87–5.11)
RDW: 17.1 % — ABNORMAL HIGH (ref 11.5–15.5)
WBC: 3.8 10*3/uL — ABNORMAL LOW (ref 4.0–10.5)

## 2015-09-21 LAB — COMPREHENSIVE METABOLIC PANEL
ALBUMIN: 3.3 g/dL — AB (ref 3.5–5.0)
ALK PHOS: 100 U/L (ref 38–126)
ALT: 13 U/L — ABNORMAL LOW (ref 14–54)
AST: 22 U/L (ref 15–41)
Anion gap: 10 (ref 5–15)
BILIRUBIN TOTAL: 0.3 mg/dL (ref 0.3–1.2)
BUN: 49 mg/dL — AB (ref 6–20)
CO2: 24 mmol/L (ref 22–32)
Calcium: 9 mg/dL (ref 8.9–10.3)
Chloride: 104 mmol/L (ref 101–111)
Creatinine, Ser: 1.72 mg/dL — ABNORMAL HIGH (ref 0.44–1.00)
GFR calc Af Amer: 32 mL/min — ABNORMAL LOW (ref >60)
GFR calc non Af Amer: 27 mL/min — ABNORMAL LOW (ref >60)
GLUCOSE: 103 mg/dL — AB (ref 65–99)
Potassium: 3.7 mmol/L (ref 3.5–5.1)
Sodium: 138 mmol/L (ref 135–145)
TOTAL PROTEIN: 7.9 g/dL (ref 6.5–8.1)

## 2015-09-21 NOTE — Telephone Encounter (Signed)
Fax received from Dr. Leary Roca ([facility]he[facility]) at [facility] requesting labs as follows:  CBC diff, CMET every 2 weeks x 2.  CBC diff, CMET every 2 months thereafter through 03/22/2016.  Orders are placed.  We will fax results to [phone]  Doy Mince 09/21/2015 6:53 PM

## 2015-09-23 ENCOUNTER — Ambulatory Visit (HOSPITAL_COMMUNITY): Payer: Medicare Other | Admitting: Oncology

## 2015-09-23 ENCOUNTER — Other Ambulatory Visit (HOSPITAL_COMMUNITY): Payer: Medicare Other

## 2015-09-23 ENCOUNTER — Encounter (HOSPITAL_COMMUNITY): Payer: Medicare Other | Attending: Hematology & Oncology | Admitting: Oncology

## 2015-09-23 VITALS — BP 103/55 | HR 73 | Temp 98.7°F | Resp 20 | Wt 146.5 lb

## 2015-09-23 DIAGNOSIS — D638 Anemia in other chronic diseases classified elsewhere: Secondary | ICD-10-CM | POA: Diagnosis not present

## 2015-09-23 DIAGNOSIS — M069 Rheumatoid arthritis, unspecified: Secondary | ICD-10-CM | POA: Diagnosis not present

## 2015-09-23 DIAGNOSIS — N182 Chronic kidney disease, stage 2 (mild): Secondary | ICD-10-CM | POA: Diagnosis not present

## 2015-09-23 DIAGNOSIS — D649 Anemia, unspecified: Secondary | ICD-10-CM | POA: Insufficient documentation

## 2015-09-23 DIAGNOSIS — D509 Iron deficiency anemia, unspecified: Secondary | ICD-10-CM

## 2015-09-23 NOTE — Progress Notes (Signed)
[place], [place] [address]  Anemia of chronic disease - Plan: Iron and TIBC, Ferritin  Rheumatoid arthritis involving multiple sites, unspecified rheumatoid factor presence (HCC)  Iron deficiency anemia - Plan: Iron and TIBC, Ferritin  CURRENT THERAPY: IV iron replacement when indicated, continued monitoring of blood counts, and history of PRBC transfusions  INTERVAL HISTORY: [name] 75 y.o. female returns for followup of iron deficiency anemia in the setting of Stage II chronic renal disease, requiring infrequent IV replacement, and rheumatoid arthritis, on Plaquenil. S/P EGD/colonoscopy by Dr. Laural Golden on 03/04/2015 demonstrating a small AVM in duodenum, sub-optimal prep on colonoscopy with small polyps which could have hindered the ability to identify AVMs. No camera capsule study at this time. AND Distant history of left breast cancer in 1992 treated with radial mastectomy followed by 5 years worth of tamoxifen without XRT.  I personally reviewed and went over laboratory results with the patient.  The results are noted within this dictation.    Fax received from Dr. Leary Roca (Rheum) at [facility] requesting labs as follows: CBC diff, CMET every 2 weeks x 2. CBC diff, CMET every 2 months thereafter through 03/22/2016. Orders are placed. We will fax results to [phone].  [name] is seen today.  She notes a number of nonspecific complaints.  She reports more good days than bad days.  She reports some discomfort in her abdomen that is nonspecific.  It is not reproducible.  She notes that sometimes it interferes with her desire to eat.  She reports nausea without vomiting.  "Not every day."  She notes more good days than bad days.  She reports "I just feel bad."  Her vitals are stable.  Her weight is stable.  "And I piss a lot."  I started to laugh and she was surprised that I heard what she  said, despite her saying it in a normal voice.  She denies any pain with urination or burning.    "How am I doing from what you see me for?"  Her counts are stable.  We are going to monitor closely moving forward based upon Dr. Leary Roca (Rheum) request above.  Review of Systems  Constitutional: Negative.   HENT: Negative.   Eyes: Negative.   Respiratory: Negative.   Cardiovascular: Negative.   Gastrointestinal: Positive for nausea and abdominal pain. Negative for vomiting.  Genitourinary: Positive for frequency. Negative for dysuria and urgency.  Musculoskeletal: Negative.   Skin: Negative.   Neurological: Negative.   Psychiatric/Behavioral: Negative.     Past Medical History  Diagnosis Date  . Arteriosclerotic cardiovascular disease (ASCVD)     Remote PTCA by patient report; LBBB; associated cardiomyopathy, presumed ischemic with EF 40-45% previously, 20% in 06/2009; h/o clinical congestive heart failure; negative stress nuclear in 2009 with inferoseptal and apical scar  . Anemia     Hgb of 9-10  . Hyperlipidemia   . Breast cancer ([place])   . Hypertension   . Automatic implantable cardioverter-defibrillator in situ   . HOH (hard of hearing)   . Anemia of chronic disease     Hgb of 9-10 chronically; 06/2010: H&H-10.7/33.5, MCV-81, normal iron studies in 2010     Past Surgical History  Procedure Laterality Date  . Total knee arthroplasty Right     Dr.Harrison  . Bi-ventricular implantable cardioverter defibrillator  (crt-d)  07/28/2012  . Cataract extraction w/ intraocular lens implant Left   . Tubal ligation    .  Breast biopsy Bilateral   . Mastectomy Left 1998  . Cataract extraction w/phaco Right 09/15/2013    Procedure: CATARACT EXTRACTION PHACO AND INTRAOCULAR LENS PLACEMENT (IOC);  Surgeon: Elta Guadeloupe T. Gershon Crane, MD;  Location: [facility];  Service: Ophthalmology;  Laterality: Right;  CDE:  10.74  . Bi-ventricular implantable cardioverter defibrillator N/A 07/28/2012     Procedure: BI-VENTRICULAR IMPLANTABLE CARDIOVERTER DEFIBRILLATOR  (CRT-D);  Surgeon: Evans Lance, MD;  Location: [facility] CATH LAB;  Service: Cardiovascular;  Laterality: N/A;  . Colonoscopy N/A 03/04/2015    Procedure: COLONOSCOPY;  Surgeon: Rogene Houston, MD;  Location: [facility] ENDO SUITE;  Service: Endoscopy;  Laterality: N/A;  10:50   . Esophagogastroduodenoscopy N/A 03/04/2015    Procedure: ESOPHAGOGASTRODUODENOSCOPY (EGD);  Surgeon: Rogene Houston, MD;  Location: [facility] ENDO SUITE;  Service: Endoscopy;  Laterality: N/A;    Family History  Problem Relation Age of Onset  . Diabetes Father   . Pancreatic cancer Father   . Hypertension Brother     Social History   Social History  . Marital Status: Married    Spouse Name: N/A  . Number of Children: N/A  . Years of Education: N/A   Occupational History  . Retired    Social History Main Topics  . Smoking status: Former Smoker -- 0.25 packs/day    Types: Cigarettes    Start date: 09/17/1956    Quit date: 04/24/2007  . Smokeless tobacco: Current User    Types: Chew     Comment: 07/03/2013 "smoked some; don't know how much or how long or when I quit"  . Alcohol Use: No  . Drug Use: No  . Sexual Activity: Not Currently   Other Topics Concern  . Not on file   Social History Narrative   Married, lives with spouse   1 daughter, died in 40 in a car accident   Retired - worked in [employer] fund[employer]   No recent travel     PHYSICAL EXAMINATION  ECOG PERFORMANCE STATUS: 1 - Symptomatic but completely ambulatory  Filed Vitals:   09/23/15 0928  BP: 103/55  Pulse: 73  Temp: 98.7 F (37.1 C)  Resp: 20    GENERAL:alert, no distress, well nourished, well developed, comfortable, cooperative and unaccompanied SKIN: skin color, texture, turgor are normal, no rashes or significant lesions HEAD: Normocephalic, No masses, lesions, tenderness or abnormalities EYES: normal, EOMI, Conjunctiva are pink and non-injected EARS: External ears  normal OROPHARYNX:lips, buccal mucosa, and tongue normal and mucous membranes are moist  NECK: supple, trachea midline LYMPH:  not examined BREAST:not examined LUNGS: clear to auscultation  HEART: regular rate & rhythm ABDOMEN:abdomen soft, non-tender, normal bowel sounds and no masses or organomegaly BACK: Back symmetric, no curvature. EXTREMITIES:less then 2 second capillary refill, no joint deformities, effusion, or inflammation, no skin discoloration, no cyanosis  NEURO: alert & oriented x 3 with fluent speech, no focal motor/sensory deficits, gait normal   LABORATORY DATA: CBC    Component Value Date/Time   WBC 3.8* 09/21/2015 1100   RBC 3.39* 09/21/2015 1100   RBC 4.30 02/17/2015 1512   HGB 8.3* 09/21/2015 1100   HCT 26.5* 09/21/2015 1100   PLT 225 09/21/2015 1100   MCV 78.2 09/21/2015 1100   MCH 24.5* 09/21/2015 1100   MCHC 31.3 09/21/2015 1100   RDW 17.1* 09/21/2015 1100   LYMPHSABS 1.3 09/21/2015 1100   MONOABS 0.3 09/21/2015 1100   EOSABS 0.2 09/21/2015 1100   BASOSABS 0.0 09/21/2015 1100      Chemistry  Component Value Date/Time   NA 138 09/21/2015 1100   K 3.7 09/21/2015 1100   CL 104 09/21/2015 1100   CO2 24 09/21/2015 1100   BUN 49* 09/21/2015 1100   CREATININE 1.72* 09/21/2015 1100   CREATININE 1.23* 09/17/2014 1347      Component Value Date/Time   CALCIUM 9.0 09/21/2015 1100   ALKPHOS 100 09/21/2015 1100   AST 22 09/21/2015 1100   ALT 13* 09/21/2015 1100   BILITOT 0.3 09/21/2015 1100        PENDING LABS:   RADIOGRAPHIC STUDIES:  Ct Chest High Resolution  08/31/2015  CLINICAL DATA:  Recent diagnosis of rheumatoid arthritis. Plan to initiate methotrexate therapy. No respiratory symptoms are reported. Suggestion of chronic interstitial lung disease on recent chest radiograph. Clinical request to evaluate for interstitial lung disease. History of left breast cancer status post left mastectomy in 1998. Former smoker, quit in 2009 per  [place] [place] records. EXAM: CT CHEST WITHOUT CONTRAST TECHNIQUE: Multidetector CT imaging of the chest was performed following the standard protocol without intravenous contrast. High resolution imaging of the lungs, as well as inspiratory and expiratory imaging, was performed. COMPARISON:  No prior chest CT.  06/13/2015 chest radiograph. FINDINGS: Partially motion degraded study. Mediastinum/Nodes: Mild cardiomegaly. Trace pericardial fluid/thickening. Three lead left subclavian ICD is noted with lead tips in the right atrium, right ventricular apex and coronary sinus. Atherosclerotic nonaneurysmal thoracic aorta. Normal caliber pulmonary arteries. Poorly visualized thyroid due to streak artifact with no gross thyroid nodule. Normal esophagus. Left axillary surgical clips. No axillary adenopathy. There a few mildly enlarged right paratracheal nodes, largest 1.5 cm (series 2/ image 46). No additional pathologically enlarged mediastinal or gross hilar nodes on this noncontrast study. Lungs/Pleura: No pneumothorax. No pleural effusion. Solid left upper lobe 3 mm pulmonary nodule (series 4/ image 35). No acute consolidative airspace disease, additional significant pulmonary nodules or lung masses. No evidence of significant air trapping on the limited expiration sequence. Mild centrilobular emphysema and mild diffuse bronchial wall thickening. There is relatively diffuse subpleural reticulation throughout both lungs, slightly more prominent in the lower lobes. There is mild traction bronchiectasis in the lower lobes. Mild subpleural cystic change in the right greater than left upper lobes (for example on series 5/image 9 in the right upper lobe) is favored represent mild paraseptal emphysema, although early honeycombing is possible. Upper abdomen: Unremarkable. Musculoskeletal: No aggressive appearing focal osseous lesions. Moderate degenerative changes in the thoracic spine. Status post left mastectomy.  IMPRESSION: 1. Interstitial lung disease characterized by diffuse subpleural reticulation and mild traction bronchiectasis, with a slight basilar gradient. Mild subpleural cystic change in the upper lobes, favor mild paraseptal emphysema, cannot exclude early honeycombing. These findings probably represent usual interstitial pneumonia (UIP) due to rheumatoid arthritis. A follow-up high-resolution chest CT study in 12 months is recommended to assess pattern stability. 2. Mild paratracheal adenopathy, nonspecific, probably reactive. 3. Left upper lobe 3 mm pulmonary nodule, which can be reassessed on follow-up chest CT in 12 months. This recommendation follows the consensus statement: Guidelines for Management of Incidental Pulmonary Nodules Detected on CT Images:From the Fleischner Society 2017; published online before print (10.1148/radiol.IJ:2314499). Electronically Signed   By: Ilona Sorrel M.D.   On: 08/31/2015 17:11     PATHOLOGY:    ASSESSMENT AND PLAN:  Anemia of chronic disease Iron deficiency anemia in the setting of Stage II chronic renal disease, requiring infrequent IV replacement, and rheumatoid arthritis followed by Dr. Leary Roca (Rheum).  S/P EGD/colonoscopy by Dr.  Rehman on 03/04/2015 demonstrating a small AVM in duodenum, sub-optimal prep on colonoscopy with small polyps which could have hindered the ability to identify AVMs. No camera capsule study at this time.  Labs in 2 and 4 weeks: CBC diff, CMET.  Labs in 12 weeks: CBC diff, CMET, iron/TIBC, ferritin.  Return in 12 weeks for follow-up.  Rheumatoid arthritis Followed and managed by Dr. Leary Roca (Rheum)     ORDERS PLACED FOR THIS ENCOUNTER: Orders Placed This Encounter  Procedures  . Iron and TIBC  . Ferritin    MEDICATIONS PRESCRIBED THIS ENCOUNTER: No orders of the defined types were placed in this encounter.    THERAPY PLAN:  Continue to monitor counts, including iron studies per Dr. Leary Roca (Rheum) orders mentioned above.  Continue follow-up with rheumatology as directed.  All questions were answered. The patient knows to call the clinic with any problems, questions or concerns. We can certainly see the patient much sooner if necessary.  Patient and plan discussed with Dr. Ancil Linsey and she is in agreement with the aforementioned.   This note is electronically signed by: Doy Mince 09/23/2015 6:32 PM

## 2015-09-23 NOTE — Assessment & Plan Note (Signed)
Iron deficiency anemia in the setting of Stage II chronic renal disease, requiring infrequent IV replacement, and rheumatoid arthritis followed by Dr. Leary Roca (Rheum).  S/P EGD/colonoscopy by Dr. Laural Golden on 03/04/2015 demonstrating a small AVM in duodenum, sub-optimal prep on colonoscopy with small polyps which could have hindered the ability to identify AVMs. No camera capsule study at this time.  Labs in 2 and 4 weeks: CBC diff, CMET.  Labs in 12 weeks: CBC diff, CMET, iron/TIBC, ferritin.  Return in 12 weeks for follow-up.

## 2015-09-23 NOTE — Patient Instructions (Addendum)
[place] at [facility] Discharge Instructions  RECOMMENDATIONS MADE BY THE CONSULTANT AND ANY TEST RESULTS WILL BE SENT TO YOUR REFERRING PHYSICIAN.  No labs today  Labs in 2 weeks and 4 weeks  Labs in 3 months with MD appt    Thank you for choosing [place] at [facility] to provide your oncology and hematology care.  To afford each patient quality time with our provider, please arrive at least 15 minutes before your scheduled appointment time.   Beginning January 23rd 2017 lab work for the [facility] will be done in the  Main lab at [facility] on 1st floor. If you have a lab appointment with the [facility] please come in thru the  Main Entrance and check in at the main information desk  You need to re-schedule your appointment should you arrive 10 or more minutes late.  We strive to give you quality time with our providers, and arriving late affects you and other patients whose appointments are after yours.  Also, if you no show three or more times for appointments you may be dismissed from the clinic at the providers discretion.     Again, thank you for choosing [facility].  Our hope is that these requests will decrease the amount of time that you wait before being seen by our physicians.       _____________________________________________________________  Should you have questions after your visit to [facility], please contact our office at [phone] between the hours of 8:30 a.m. and 4:30 p.m.  Voicemails left after 4:30 p.m. will not be returned until the following business day.  For prescription refill requests, have your pharmacy contact our office.         Resources For Cancer Patients and their Caregivers ? American Cancer Society: Can assist with transportation, wigs, general needs, runs Look Good Feel Better.        424 510 6810 ? Cancer Care: Provides financial assistance,  online support groups, medication/co-pay assistance.  1-800-813-HOPE (561)692-7767) ? [place] Assists [place] cancer patients and their families through emotional , educational and financial support.  639-689-9666 ? [place] DSS Where to apply for food stamps, Medicaid and utility assistance. 810-740-3311 ? [facility]: Transportation to medical appointments. (954)344-6169 ? Social Security Administration: May apply for disability if have a Stage IV cancer. (604) 380-6144 256 588 2836 ? [employer], Disability and Transit Services: Assists with nutrition, care and transit needs. [place] Support Programs: @10RELATIVEDAYS @ > Cancer Support Group  2nd Tuesday of the month 1pm-2pm, Journey Room  > Creative Journey  3rd Tuesday of the month 1130am-1pm, Journey Room  > Look Good Feel Better  1st Wednesday of the month 10am-12 noon, Journey Room (Call [place] to register [phone])

## 2015-09-23 NOTE — Assessment & Plan Note (Signed)
Followed and managed by Dr. Leary Roca (Rheum)

## 2015-09-25 NOTE — Progress Notes (Signed)
Cardiology Office Note   Date:  09/26/2015   ID:  [name], DOB October 28, 1940, MRN [id number]  PCP:  [name], MD  Cardiologist:  Bryna Colander, NP   Chief Complaint  Patient presents with  . Congestive Heart Failure      History of Present Illness: [name] is a 75 y.o. female who presents for [name] is a 75 y.o. female who presents for ongoing assessment and management of chronic systolic heart failure, CAD, ICD in situ ([employer]), hypertension, with chronic left bundle branch block.patient and chronic complaints of dizziness and tiredness with anxiety. On last office visit no changes were made. She is being treated by hematology for chronic iron deficient anemia.   She is very she is very hard of hearing, and is not understanding all that I told her. I have spoken again on how important it is to take her medications especially the iron replacement as her most recent labs revealed a hemoglobin of 8.3, with hematocrit of 26.5. She states that her Dr. Cherlynn Kaiser in the wrong medication and she is waiting for refills of a new medication. The patient is otherwise not complaining of dizziness dyspnea on exertion or chest discomfort.  Past Medical History  Diagnosis Date  . Arteriosclerotic cardiovascular disease (ASCVD)     Remote PTCA by patient report; LBBB; associated cardiomyopathy, presumed ischemic with EF 40-45% previously, 20% in 06/2009; h/o clinical congestive heart failure; negative stress nuclear in 2009 with inferoseptal and apical scar  . Anemia     Hgb of 9-10  . Hyperlipidemia   . Breast cancer ([place])   . Hypertension   . Automatic implantable cardioverter-defibrillator in situ   . HOH (hard of hearing)   . Anemia of chronic disease     Hgb of 9-10 chronically; 06/2010: H&H-10.7/33.5, MCV-81, normal iron studies in 2010     Past Surgical History  Procedure Laterality Date  . Total knee arthroplasty Right     Dr.Harrison  . Bi-ventricular  implantable cardioverter defibrillator  (crt-d)  07/28/2012  . Cataract extraction w/ intraocular lens implant Left   . Tubal ligation    . Breast biopsy Bilateral   . Mastectomy Left 1998  . Cataract extraction w/phaco Right 09/15/2013    Procedure: CATARACT EXTRACTION PHACO AND INTRAOCULAR LENS PLACEMENT (IOC);  Surgeon: Elta Guadeloupe T. Gershon Crane, MD;  Location: [facility];  Service: Ophthalmology;  Laterality: Right;  CDE:  10.74  . Bi-ventricular implantable cardioverter defibrillator N/A 07/28/2012    Procedure: BI-VENTRICULAR IMPLANTABLE CARDIOVERTER DEFIBRILLATOR  (CRT-D);  Surgeon: Evans Lance, MD;  Location: [facility] CATH LAB;  Service: Cardiovascular;  Laterality: N/A;  . Colonoscopy N/A 03/04/2015    Procedure: COLONOSCOPY;  Surgeon: Rogene Houston, MD;  [facility];  Service: Endoscopy;  Laterality: N/A;  10:50   . Esophagogastroduodenoscopy N/A 03/04/2015    Procedure: ESOPHAGOGASTRODUODENOSCOPY (EGD);  Surgeon: Rogene Houston, MD;  Location: [facility];  Service: Endoscopy;  Laterality: N/A;     Current Outpatient Prescriptions  Medication Sig Dispense Refill  . acetaminophen (TYLENOL) 500 MG tablet Take 1,000 mg by mouth every 6 (six) hours as needed.     [name] aspirin EC 81 MG tablet Take 81 mg by mouth daily.    . carvedilol (COREG) 6.25 MG tablet TAKE 1 TABLET BY MOUTH TWICE A DAY WITH MEALS 60 tablet 6  . folic acid (FOLVITE) 1 MG tablet Take 1 mg by mouth daily.    . furosemide (LASIX) 40  MG tablet TAKE 1 TABLET BY MOUTH TWICE A DAY 60 tablet 6  . hydroxychloroquine (PLAQUENIL) 200 MG tablet Take 100-200 mg by mouth 2 (two) times daily. Takes a whole tablet in the morning and half a tablet in the evening .    [name] KLOR-CON M10 10 MEQ tablet TAKE 1 TABLET BY MOUTH TWICE A DAY 60 tablet 6  . lovastatin (MEVACOR) 40 MG tablet TAKE 1 TABLET (40 MG TOTAL) BY MOUTH AT BEDTIME. 30 tablet 6  . omeprazole (PRILOSEC) 20 MG capsule Take 20 mg by mouth daily. For gas/heartburn    . PROAIR HFA  108 (90 BASE) MCG/ACT inhaler Inhale 2 puffs into the lungs every 4 (four) hours as needed for wheezing or shortness of breath. Reported on 08/26/2015    . lisinopril (PRINIVIL,ZESTRIL) 10 MG tablet Take 1 tablet (10 mg total) by mouth daily. 90 tablet 3   No current facility-administered medications for this visit.    Allergies:   Review of patient's allergies indicates no known allergies.    Social History:  The patient  reports that she quit smoking about 8 years ago. Her smoking use included Cigarettes. She started smoking about 59 years ago. She smoked 0.25 packs per day. Her smokeless tobacco use includes Chew. She reports that she does not drink alcohol or use illicit drugs.   Family History:  The patient's family history includes Diabetes in her father; Hypertension in her brother; Pancreatic cancer in her father.    ROS: All other systems are reviewed and negative. Unless otherwise mentioned in H&P    PHYSICAL EXAM: VS:  BP 96/52 mmHg  Pulse 81  Ht 5\' 5"  (1.651 m)  Wt 145 lb (65.772 kg)  BMI 24.13 kg/m2  SpO2 96% , BMI Body mass index is 24.13 kg/(m^2). GEN: Well nourished, well developed, in no acute distress HEENT: normal Neck: no JVD, carotid bruits, or masses Cardiac: RRR; 1/6 no murmurs, rubs, or gallops,no edema  Respiratory:  clear to auscultation bilaterally, normal work of breathing GI: soft, nontender, nondistended, + BS MS: no deformity or atrophy Skin: warm and dry, no rash Neuro:  Strength and sensation are intact. Extremely hard of hearing Psych: euthymic mood, full affect   EKG:  Sinus rhythm with right bundle branch block. Rate of 60 beats per minute   Recent Labs: 09/21/2015: ALT 13*; BUN 49*; Creatinine, Ser 1.72*; Hemoglobin 8.3*; Platelets 225; Potassium 3.7; Sodium 138    Lipid Panel    Component Value Date/Time   CHOL 134 05/23/2012 0905   TRIG 103 05/23/2012 0905   HDL 30* 05/23/2012 0905   CHOLHDL 4.5 05/23/2012 0905   VLDL 21 05/23/2012  0905   LDLCALC 83 05/23/2012 0905      Wt Readings from Last 3 Encounters:  09/26/15 145 lb (65.772 kg)  09/23/15 146 lb 8 oz (66.452 kg)  08/26/15 145 lb 9.6 oz (66.044 kg)    ASSESSMENT AND PLAN:  1. Hypertension: Blood pressure is low today and 96/52, in the setting of anemia I will decrease her lisinopril from 20 mg a day to 10 mg a day. I have written on her bottle for her to take "one half tablet" and placed stars around it. I've advised her when her new prescription comes in she is to take the whole tablet. This was reinforced by the nurse on checked out as well. The medication changes highlighted on her AVS.   2. Iron deficiency anemia:she is being followed by hematology and has blood  drawn every 2 weeks. She was found to be anemic on the last blood draw dated 09/21/2015. Hemoglobin 8.3 hematocrit 26.5. She is on iron replacement therapy. She denies symptoms of chest pain or dyspnea or dizziness. She is advised to followup with frequent blood draws as directed.  3. Coronary artery disease:it is important to keep her hemoglobin above 8 in this setting. She will continue aspirin statin and carvedilol as directed. If necessary may need to adjust on followup visits if she remains anemic.   Current medicines are reviewed at length with the patient today.    Labs/ tests ordered today include:None Orders Placed This Encounter  Procedures  . EKG 12-Lead     Disposition:   FU with 1 month to evaluate her response to medication changes. Signed, Jory Sims, NP  09/26/2015 2:16 PM    [facility] [address] [place], [place] [address] Phone: [phone]; Fax: [phone]

## 2015-09-26 ENCOUNTER — Ambulatory Visit (INDEPENDENT_AMBULATORY_CARE_PROVIDER_SITE_OTHER): Payer: Medicare Other | Admitting: Adult Health

## 2015-09-26 ENCOUNTER — Encounter: Payer: Self-pay | Admitting: Adult Health

## 2015-09-26 VITALS — BP 96/52 | HR 81 | Ht 65.0 in | Wt 145.0 lb

## 2015-09-26 DIAGNOSIS — I2510 Atherosclerotic heart disease of native coronary artery without angina pectoris: Secondary | ICD-10-CM

## 2015-09-26 DIAGNOSIS — D508 Other iron deficiency anemias: Secondary | ICD-10-CM | POA: Diagnosis not present

## 2015-09-26 DIAGNOSIS — I10 Essential (primary) hypertension: Secondary | ICD-10-CM | POA: Diagnosis not present

## 2015-09-26 MED ORDER — LISINOPRIL 10 MG PO TABS: 10.0000 mg | ORAL_TABLET | Freq: Every day | ORAL | Status: DC

## 2015-09-26 NOTE — Progress Notes (Deleted)
Name: [name]    DOB: 08/09/1940  Age: 75 y.o.  MR#: [id number]       PCP:  [name], MD      Insurance: Payor: [employer] MEDICARE / Plan: [facility] MEDICARE / Product Type: *No Product type* /   CC:    Chief Complaint  Patient presents with  . Congestive Heart Failure    VS Filed Vitals:   09/26/15 1332  BP: 96/52  Pulse: 81  Height: 5\' 5"  (1.651 m)  Weight: 145 lb (65.772 kg)  SpO2: 96%    Weights Current Weight  09/26/15 145 lb (65.772 kg)  09/23/15 146 lb 8 oz (66.452 kg)  08/26/15 145 lb 9.6 oz (66.044 kg)    Blood Pressure  BP Readings from Last 3 Encounters:  09/26/15 96/52  09/23/15 103/55  08/26/15 120/54     Admit date:  (Not on file) Last encounter with RMR:  08/27/2015   Allergy Review of patient's allergies indicates no known allergies.  Current Outpatient Prescriptions  Medication Sig Dispense Refill  . acetaminophen (TYLENOL) 500 MG tablet Take 1,000 mg by mouth every 6 (six) hours as needed.     [name] aspirin EC 81 MG tablet Take 81 mg by mouth daily.    . carvedilol (COREG) 6.25 MG tablet TAKE 1 TABLET BY MOUTH TWICE A DAY WITH MEALS 60 tablet 6  . folic acid (FOLVITE) 1 MG tablet Take 1 mg by mouth daily.    . furosemide (LASIX) 40 MG tablet TAKE 1 TABLET BY MOUTH TWICE A DAY 60 tablet 6  . hydroxychloroquine (PLAQUENIL) 200 MG tablet Take 100-200 mg by mouth 2 (two) times daily. Takes a whole tablet in the morning and half a tablet in the evening .    [name] KLOR-CON M10 10 MEQ tablet TAKE 1 TABLET BY MOUTH TWICE A DAY 60 tablet 6  . lisinopril (PRINIVIL,ZESTRIL) 20 MG tablet Take 1 tablet (20 mg total) by mouth daily. 90 tablet 3  . lovastatin (MEVACOR) 40 MG tablet TAKE 1 TABLET (40 MG TOTAL) BY MOUTH AT BEDTIME. 30 tablet 6  . omeprazole (PRILOSEC) 20 MG capsule Take 20 mg by mouth daily. For gas/heartburn    . PROAIR HFA 108 (90 BASE) MCG/ACT inhaler Inhale 2 puffs into the lungs every 4 (four) hours as needed for wheezing or shortness of  breath. Reported on 08/26/2015     No current facility-administered medications for this visit.    Discontinued Meds:   There are no discontinued medications.  Patient Active Problem List   Diagnosis Date Noted  . CAD S/P remote PCI- no details 09/02/2014  . Cardiomyopathy, ischemic-EF 30-35% March 2015 09/02/2014  . Diastolic dysfunction-grade 2 09/02/2014  . CHF exacerbation ([place]) 08/28/2014  . Acute on chronic combined systolic and diastolic congestive heart failure ([place]) 08/28/2014  . Iron deficiency anemia 08/20/2013  . Malnutrition of moderate degree ([place]) 07/21/2013  . PNA (pneumonia) 07/19/2013  . HCAP (healthcare-associated pneumonia) 07/17/2013  . Sepsis ([place]) 07/17/2013  . ARF (acute renal failure) ([place]) 07/17/2013  . Rheumatoid arthritis ([place]) 07/04/2013  . Hypokalemia 07/03/2013  . Pain and swelling of wrist 07/03/2013  . Chest pain 07/03/2013  . Biventricular ICD in place (MDT 2014) 11/06/2012  . Anemia of chronic disease   . Breast cancer ([place])   . Hypertension   . Dyslipidemia 05/24/2009  . Chronic systolic heart failure (HCC) 05/24/2009    LABS    Component Value Date/Time   NA 138 09/21/2015 1100  NA 143 06/13/2015 1147   NA 138 02/03/2015 1027   K 3.7 09/21/2015 1100   K 3.8 06/13/2015 1147   K 4.2 02/03/2015 1027   CL 104 09/21/2015 1100   CL 105 06/13/2015 1147   CL 102 02/03/2015 1027   CO2 24 09/21/2015 1100   CO2 25 06/13/2015 1147   CO2 27 02/03/2015 1027   GLUCOSE 103* 09/21/2015 1100   GLUCOSE 96 06/13/2015 1147   GLUCOSE 101* 02/03/2015 1027   BUN 49* 09/21/2015 1100   BUN 26* 06/13/2015 1147   BUN 19 02/03/2015 1027   CREATININE 1.72* 09/21/2015 1100   CREATININE 1.04* 06/13/2015 1147   CREATININE 1.09* 02/03/2015 1027   CREATININE 1.23* 09/17/2014 1347   CREATININE 0.76 09/11/2013 1005   CREATININE 0.90 07/14/2012 1330   CALCIUM 9.0 09/21/2015 1100   CALCIUM 9.1 06/13/2015 1147   CALCIUM 8.9 02/03/2015 1027   GFRNONAA 27*  09/21/2015 1100   GFRNONAA 51* 06/13/2015 1147   GFRNONAA 48* 02/03/2015 1027   GFRAA 32* 09/21/2015 1100   GFRAA 59* 06/13/2015 1147   GFRAA 56* 02/03/2015 1027   CMP     Component Value Date/Time   NA 138 09/21/2015 1100   K 3.7 09/21/2015 1100   CL 104 09/21/2015 1100   CO2 24 09/21/2015 1100   GLUCOSE 103* 09/21/2015 1100   BUN 49* 09/21/2015 1100   CREATININE 1.72* 09/21/2015 1100   CREATININE 1.23* 09/17/2014 1347   CALCIUM 9.0 09/21/2015 1100   PROT 7.9 09/21/2015 1100   ALBUMIN 3.3* 09/21/2015 1100   AST 22 09/21/2015 1100   ALT 13* 09/21/2015 1100   ALKPHOS 100 09/21/2015 1100   BILITOT 0.3 09/21/2015 1100   GFRNONAA 27* 09/21/2015 1100   GFRAA 32* 09/21/2015 1100       Component Value Date/Time   WBC 3.8* 09/21/2015 1100   WBC 5.1 08/19/2015 1153   WBC 5.6 07/22/2015 1104   HGB 8.3* 09/21/2015 1100   HGB 9.1* 08/19/2015 1153   HGB 9.2* 07/22/2015 1104   HCT 26.5* 09/21/2015 1100   HCT 29.0* 08/19/2015 1153   HCT 29.2* 07/22/2015 1104   MCV 78.2 09/21/2015 1100   MCV 78.8 08/19/2015 1153   MCV 80.9 07/22/2015 1104    Lipid Panel     Component Value Date/Time   CHOL 134 05/23/2012 0905   TRIG 103 05/23/2012 0905   HDL 30* 05/23/2012 0905   CHOLHDL 4.5 05/23/2012 0905   VLDL 21 05/23/2012 0905   LDLCALC 83 05/23/2012 0905    ABG No results found for: PHART, PCO2ART, PO2ART, HCO3, TCO2, ACIDBASEDEF, O2SAT   Lab Results  Component Value Date   TSH 0.501 07/03/2013   BNP (last 3 results) No results for input(s): BNP in the last 8760 hours.  ProBNP (last 3 results) No results for input(s): PROBNP in the last 8760 hours.  Cardiac Panel (last 3 results) No results for input(s): CKTOTAL, CKMB, TROPONINI, RELINDX in the last 72 hours.  Iron/TIBC/Ferritin/ %Sat    Component Value Date/Time   IRON 31 02/17/2015 1512   TIBC 185* 02/17/2015 1512   FERRITIN 356* 03/31/2015 0853   IRONPCTSAT 17 02/17/2015 1512     EKG Orders placed or performed  in visit on 09/26/15  . EKG 12-Lead     Prior Assessment and Plan Problem List as of 09/26/2015      Cardiovascular and Mediastinum   Chronic systolic heart failure [facility])   Last Assessment & Plan 09/10/2014 Office Visit Written 09/10/2014  9:49 AM by Evans Lance, MD    Her chronic systolic heart failure is well compensated. She will continue her current meds. Her fluid index is normal.       Hypertension   Last Assessment & Plan 09/10/2014 Office Visit Written 09/10/2014  9:49 AM by Evans Lance, MD    Her blood pressure is well controlled. No change in meds.       CAD S/P remote PCI- no details   Cardiomyopathy, ischemic-EF 30-35% March 2015   CHF exacerbation (HCC)   Acute on chronic combined systolic and diastolic congestive heart failure (HCC)     Respiratory   HCAP (healthcare-associated pneumonia)   PNA (pneumonia)     Musculoskeletal and Integument   Rheumatoid arthritis [facility])   Last Assessment & Plan 09/23/2015 Office Visit Written 09/23/2015  6:30 PM by Baird Cancer, PA-C    Followed and managed by Dr. Leary Roca (Rheum)          Genitourinary   ARF (acute renal failure) [facility])     Other   Dyslipidemia   Last Assessment & Plan 06/09/2012 Office Visit Edited 06/09/2012  5:53 PM by Yehuda Savannah, MD    Acceptable lipid profile last year, but as long she requires treatment with a statin, a higher dose will produce even a more favorable effect on her serum lipids and will be ordered.      Anemia of chronic disease   Last Assessment & Plan 09/23/2015 Office Visit Written 09/23/2015  6:29 PM by Baird Cancer, PA-C    Iron deficiency anemia in the setting of Stage II chronic renal disease, requiring infrequent IV replacement, and rheumatoid arthritis followed by Dr. Leary Roca (Rheum).  S/P EGD/colonoscopy by Dr. Laural Golden on 03/04/2015 demonstrating a small AVM in duodenum, sub-optimal prep on colonoscopy with small polyps which could have hindered the  ability to identify AVMs. No camera capsule study at this time.  Labs in 2 and 4 weeks: CBC diff, CMET.  Labs in 12 weeks: CBC diff, CMET, iron/TIBC, ferritin.  Return in 12 weeks for follow-up.      Biventricular ICD in place (MDT 2014)   Last Assessment & Plan 09/10/2014 Office Visit Written 09/10/2014  9:50 AM by Evans Lance, MD    Her medtronic biv ICD is working normally. Will recheck in several months.      Diastolic dysfunction-grade 2   Breast cancer (HCC)   Hypokalemia   Pain and swelling of wrist   Chest pain   Sepsis (HCC)   Malnutrition of moderate degree (HCC)   Iron deficiency anemia   Last Assessment & Plan 06/03/2014 Office Visit Written 06/21/2014  6:17 PM by Molli Hazard, MD    75 year old female who presented with microcytic anemia and iron deficiency. In spite of iron replacement she remains quite anemic. She has evidence of only mild chronic kidney disease. Fecal occult blood was negative back in March 2015. There is no recent colonoscopy or EGD documented in Epic. I recommended a more extensive anemia evaluation to which she agrees. We will draw additional laboratory studies today and I will keep her apprised results when available. If all of her laboratory studies are normal we will discuss additional valuation at her follow-up when she returns in 2 months.          Imaging: Ct Chest High Resolution  08/31/2015  CLINICAL DATA:  Recent diagnosis of rheumatoid arthritis. Plan to initiate methotrexate therapy. No respiratory symptoms  are reported. Suggestion of chronic interstitial lung disease on recent chest radiograph. Clinical request to evaluate for interstitial lung disease. History of left breast cancer status post left mastectomy in 1998. Former smoker, quit in 2009 per [place] records. EXAM: CT CHEST WITHOUT CONTRAST TECHNIQUE: Multidetector CT imaging of the chest was performed following the standard protocol without intravenous  contrast. High resolution imaging of the lungs, as well as inspiratory and expiratory imaging, was performed. COMPARISON:  No prior chest CT.  06/13/2015 chest radiograph. FINDINGS: Partially motion degraded study. Mediastinum/Nodes: Mild cardiomegaly. Trace pericardial fluid/thickening. Three lead left subclavian ICD is noted with lead tips in the right atrium, right ventricular apex and coronary sinus. Atherosclerotic nonaneurysmal thoracic aorta. Normal caliber pulmonary arteries. Poorly visualized thyroid due to streak artifact with no gross thyroid nodule. Normal esophagus. Left axillary surgical clips. No axillary adenopathy. There a few mildly enlarged right paratracheal nodes, largest 1.5 cm (series 2/ image 46). No additional pathologically enlarged mediastinal or gross hilar nodes on this noncontrast study. Lungs/Pleura: No pneumothorax. No pleural effusion. Solid left upper lobe 3 mm pulmonary nodule (series 4/ image 35). No acute consolidative airspace disease, additional significant pulmonary nodules or lung masses. No evidence of significant air trapping on the limited expiration sequence. Mild centrilobular emphysema and mild diffuse bronchial wall thickening. There is relatively diffuse subpleural reticulation throughout both lungs, slightly more prominent in the lower lobes. There is mild traction bronchiectasis in the lower lobes. Mild subpleural cystic change in the right greater than left upper lobes (for example on series 5/image 9 in the right upper lobe) is favored represent mild paraseptal emphysema, although early honeycombing is possible. Upper abdomen: Unremarkable. Musculoskeletal: No aggressive appearing focal osseous lesions. Moderate degenerative changes in the thoracic spine. Status post left mastectomy. IMPRESSION: 1. Interstitial lung disease characterized by diffuse subpleural reticulation and mild traction bronchiectasis, with a slight basilar gradient. Mild subpleural cystic  change in the upper lobes, favor mild paraseptal emphysema, cannot exclude early honeycombing. These findings probably represent usual interstitial pneumonia (UIP) due to rheumatoid arthritis. A follow-up high-resolution chest CT study in 12 months is recommended to assess pattern stability. 2. Mild paratracheal adenopathy, nonspecific, probably reactive. 3. Left upper lobe 3 mm pulmonary nodule, which can be reassessed on follow-up chest CT in 12 months. This recommendation follows the consensus statement: Guidelines for Management of Incidental Pulmonary Nodules Detected on CT Images:From the Fleischner Society 2017; published online before print (10.1148/radiol.IJ:2314499). Electronically Signed   By: Ilona Sorrel M.D.   On: 08/31/2015 17:11

## 2015-09-26 NOTE — Patient Instructions (Signed)
Your physician recommends that you schedule a follow-up appointment in: [place] has recommended you make the following change in your medication:   Decrease Lisinopril to 10 mg Daily. (Take 1/2 of the Lisinopril  pill that you have at home now.) When the new pills come in the mail take the whole pill 10 mg of Lisinopril  If you need a refill on your cardiac medications before your next appointment, please call your pharmacy.  Thank you for choosing [facility]!

## 2015-09-29 ENCOUNTER — Other Ambulatory Visit (HOSPITAL_COMMUNITY): Payer: Medicare Other

## 2015-09-29 ENCOUNTER — Ambulatory Visit (HOSPITAL_COMMUNITY)
Admission: RE | Admit: 2015-09-29 | Discharge: 2015-09-29 | Disposition: A | Discharge status: Home / Self Care | Payer: Medicare Other | Source: Ambulatory Visit | Attending: Pulmonary Disease | Admitting: Pulmonary Disease

## 2015-09-29 ENCOUNTER — Encounter: Payer: Self-pay | Admitting: Pulmonary Disease

## 2015-09-29 ENCOUNTER — Ambulatory Visit (INDEPENDENT_AMBULATORY_CARE_PROVIDER_SITE_OTHER): Payer: Medicare Other | Admitting: Pulmonary Disease

## 2015-09-29 VITALS — BP 110/70 | HR 86 | Ht 65.0 in | Wt 148.0 lb

## 2015-09-29 DIAGNOSIS — J849 Interstitial pulmonary disease, unspecified: Secondary | ICD-10-CM

## 2015-09-29 LAB — PULMONARY FUNCTION TEST
DL/VA % PRED: 53 %
DL/VA: 2.33 ml/min/mmHg/L
DLCO UNC % PRED: 42 %
DLCO UNC: 8.5 ml/min/mmHg
FEF 25-75 POST: 1.32 L/s
FEF 25-75 PRE: 0.98 L/s
FEF2575-%Change-Post: 35 %
FEF2575-%PRED-PRE: 79 %
FEF2575-%Pred-Post: 108 %
FEV1-%Change-Post: 6 %
FEV1-%Pred-Post: 105 %
FEV1-%Pred-Pre: 98 %
FEV1-Post: 1.45 L
FEV1-Pre: 1.36 L
FEV1FVC-%CHANGE-POST: 8 %
FEV1FVC-%Pred-Pre: 98 %
FEV6-%CHANGE-POST: -3 %
FEV6-%PRED-POST: 103 %
FEV6-%PRED-PRE: 106 %
FEV6-Post: 1.77 L
FEV6-Pre: 1.83 L
FEV6FVC-%PRED-POST: 105 %
FEV6FVC-%Pred-Pre: 105 %
FVC-%Change-Post: -2 %
FVC-%Pred-Post: 99 %
FVC-%Pred-Pre: 101 %
FVC-Post: 1.79 L
FVC-Pre: 1.83 L
POST FEV6/FVC RATIO: 100 %
PRE FEV1/FVC RATIO: 75 %
Post FEV1/FVC ratio: 81 %
Pre FEV6/FVC Ratio: 100 %
RV % pred: 87 %
RV: 1.91 L
TLC % PRED: 82 %
TLC: 3.8 L

## 2015-09-29 MED ORDER — ALBUTEROL SULFATE (2.5 MG/3ML) 0.083% IN NEBU
2.5000 mg | INHALATION_SOLUTION | Freq: Once | RESPIRATORY_TRACT | Status: AC
  Administered 2015-09-29: 2.5 mg via RESPIRATORY_TRACT

## 2015-09-29 NOTE — Progress Notes (Signed)
Subjective:    Patient ID: [name], female    DOB: 11-Oct-1940, 75 y.o.   MRN: [id number]  PROBLEM LIST Rheumatoid arthritis ILD, Lung fibrosis  HPI Mrs. Dooms is a 75 year old with history of coronary vascular disease, AICD, ischemic cardiomyopathy [EF 30-35%]. She has a recent diagnosis of rheumatoid arthritis.  She was started on methotrexate but this was stopped because of renal disease. She is currently on plaquinel.  She is hard of hearing and is here today with her nephew who explains things to her. She has some joint symptoms but denies any respiratory complaints. She denies any cough, sputum production, wheezing, hemoptysis.  DATA: PFTs 09/29/15 FVC 1.80 [101%) FEV1 1.36 [98%] F/F 75 TLC 82% DLCO 42% Minimal obstructive disease, severe diffusion defect.  Imaging Chest x-ray 06/13/15 Cardiomegaly, chronic interstitial lung disease. Images reviewed  High res CT 08/31/15 Images reviewed. Lung fibrosis in UIP pattern. Lung nodules  Cardiac Echocardiogram 07/01/15 EF 30-35%, diffuse hypokinesis. Grade 1 diastolic dysfunction PAP 27  Social History: She does not smoke but chews tobacco occasionally. No alcohol or drug use. She is to work in the [employer] and is currently retired.  Family History: Father-diabetes, pancreatic cancer Brother-hypertension  Past Medical History  Diagnosis Date  . Arteriosclerotic cardiovascular disease (ASCVD)     Remote PTCA by patient report; LBBB; associated cardiomyopathy, presumed ischemic with EF 40-45% previously, 20% in 06/2009; h/o clinical congestive heart failure; negative stress nuclear in 2009 with inferoseptal and apical scar  . Anemia     Hgb of 9-10  . Hyperlipidemia   . Breast cancer ([place])   . Hypertension   . Automatic implantable cardioverter-defibrillator in situ   . HOH (hard of hearing)   . Anemia of chronic disease     Hgb of 9-10 chronically; 06/2010: H&H-10.7/33.5, MCV-81, normal iron studies in 2010      Current outpatient prescriptions:  .  acetaminophen (TYLENOL) 500 MG tablet, Take 1,000 mg by mouth every 6 (six) hours as needed. , Disp: , Rfl:  .  aspirin EC 81 MG tablet, Take 81 mg by mouth daily., Disp: , Rfl:  .  carvedilol (COREG) 6.25 MG tablet, TAKE 1 TABLET BY MOUTH TWICE A DAY WITH MEALS, Disp: 60 tablet, Rfl: 6 .  folic acid (FOLVITE) 1 MG tablet, Take 1 mg by mouth daily., Disp: , Rfl:  .  furosemide (LASIX) 40 MG tablet, TAKE 1 TABLET BY MOUTH TWICE A DAY, Disp: 60 tablet, Rfl: 6 .  hydroxychloroquine (PLAQUENIL) 200 MG tablet, Take 100-200 mg by mouth 2 (two) times daily. Takes a whole tablet in the morning and half a tablet in the evening ., Disp: , Rfl:  .  KLOR-CON M10 10 MEQ tablet, TAKE 1 TABLET BY MOUTH TWICE A DAY, Disp: 60 tablet, Rfl: 6 .  lisinopril (PRINIVIL,ZESTRIL) 10 MG tablet, Take 1 tablet (10 mg total) by mouth daily., Disp: 90 tablet, Rfl: 3 .  lovastatin (MEVACOR) 40 MG tablet, TAKE 1 TABLET (40 MG TOTAL) BY MOUTH AT BEDTIME., Disp: 30 tablet, Rfl: 6 .  omeprazole (PRILOSEC) 20 MG capsule, Take 20 mg by mouth daily. For gas/heartburn, Disp: , Rfl:  .  PROAIR HFA 108 (90 BASE) MCG/ACT inhaler, Inhale 2 puffs into the lungs every 4 (four) hours as needed for wheezing or shortness of breath. Reported on 08/26/2015, Disp: , Rfl:   Review of Systems Denies any dyspnea cough, wheezing, sputum production, hemoptysis. Denies any chest pain, palpitation. Denies any nausea, vomiting, diarrhea,  constipation. Denies any fevers, chills, loss of weight, loss of appetite. All other review of systems are negative    Objective:   Physical Exam Blood pressure 110/70, pulse 86, height 5\' 5"  (1.651 m), weight 148 lb (67.132 kg), SpO2 95 % Gen: No apparent distress Neuro: No gross focal deficits. HEENT: No JVD, lymphadenopathy, thyromegaly. RS: Clear, No wheeze or crackles CVS: S1-S2 heard, no murmurs rubs gallops. Abdomen: Soft, positive bowel sounds. Musculoskeletal:  No edema.    Assessment & Plan:  #1 ILD secondary to RA Mrs. Schuneman has a recent diagnosis of rheumatoid arthritis and a CT that shows lung fibrosis in UIP pattern  She does not have any exposures or any suggestion of hypersensitivity pneumonitis.   Her PFTs were reviewed which show isolated reduction in diffusion capacity likely secondary to fibrosis. Her echo does not show any evidence of pulmonary hypertension. She does not have any overt resp symptoms at present. We will continue to monitor her.   #2 Lung nodule She will need a repeat CT in 1 year to follow on the lung nodule and lung fibrosis  Plan: - Repeat PFTs and CT in 1 year or sooner if she develops resp symptoms.  - Monitor resp symptoms  Marshell Garfinkel MD [facility] Pager [phone] If no answer or after 3pm call: [phone] 09/29/2015, 3:11 PM

## 2015-09-29 NOTE — Patient Instructions (Signed)
Continue treatment for rheumatoid arthritis as per Dr. Estanislado Pandy. We will make a return appointment to see you in 6 months.

## 2015-09-30 ENCOUNTER — Other Ambulatory Visit (HOSPITAL_COMMUNITY)
Admission: RE | Admit: 2015-09-30 | Discharge: 2015-09-30 | Disposition: A | Discharge status: Home / Self Care | Payer: Medicare Other | Source: Ambulatory Visit | Attending: Rheumatology | Admitting: Rheumatology

## 2015-09-30 DIAGNOSIS — Z79899 Other long term (current) drug therapy: Secondary | ICD-10-CM | POA: Insufficient documentation

## 2015-09-30 LAB — COMPREHENSIVE METABOLIC PANEL
ALBUMIN: 3.3 g/dL — AB (ref 3.5–5.0)
ALT: 11 U/L — ABNORMAL LOW (ref 14–54)
ANION GAP: 6 (ref 5–15)
AST: 17 U/L (ref 15–41)
Alkaline Phosphatase: 99 U/L (ref 38–126)
BILIRUBIN TOTAL: 0.2 mg/dL — AB (ref 0.3–1.2)
BUN: 37 mg/dL — AB (ref 6–20)
CO2: 28 mmol/L (ref 22–32)
Calcium: 8.9 mg/dL (ref 8.9–10.3)
Chloride: 105 mmol/L (ref 101–111)
Creatinine, Ser: 1.55 mg/dL — ABNORMAL HIGH (ref 0.44–1.00)
GFR calc Af Amer: 36 mL/min — ABNORMAL LOW (ref >60)
GFR calc non Af Amer: 31 mL/min — ABNORMAL LOW (ref >60)
GLUCOSE: 98 mg/dL (ref 65–99)
POTASSIUM: 4.2 mmol/L (ref 3.5–5.1)
SODIUM: 139 mmol/L (ref 135–145)
Total Protein: 7.7 g/dL (ref 6.5–8.1)

## 2015-10-06 ENCOUNTER — Encounter (HOSPITAL_COMMUNITY): Payer: Medicare Other

## 2015-10-06 DIAGNOSIS — D649 Anemia, unspecified: Secondary | ICD-10-CM | POA: Diagnosis present

## 2015-10-06 DIAGNOSIS — M069 Rheumatoid arthritis, unspecified: Secondary | ICD-10-CM

## 2015-10-06 LAB — CBC WITH DIFFERENTIAL/PLATELET
BASOS ABS: 0.1 10*3/uL (ref 0.0–0.1)
Basophils Relative: 1 %
EOS PCT: 4 %
Eosinophils Absolute: 0.2 10*3/uL (ref 0.0–0.7)
HCT: 24.1 % — ABNORMAL LOW (ref 36.0–46.0)
Hemoglobin: 7.6 g/dL — ABNORMAL LOW (ref 12.0–15.0)
LYMPHS ABS: 1.3 10*3/uL (ref 0.7–4.0)
Lymphocytes Relative: 29 %
MCH: 25.2 pg — AB (ref 26.0–34.0)
MCHC: 31.5 g/dL (ref 30.0–36.0)
MCV: 79.8 fL (ref 78.0–100.0)
Monocytes Absolute: 0.5 10*3/uL (ref 0.1–1.0)
Monocytes Relative: 12 %
Neutro Abs: 2.5 10*3/uL (ref 1.7–7.7)
Neutrophils Relative %: 54 %
PLATELETS: 237 10*3/uL (ref 150–400)
RBC: 3.02 MIL/uL — AB (ref 3.87–5.11)
RDW: 18.9 % — ABNORMAL HIGH (ref 11.5–15.5)
WBC: 4.6 10*3/uL (ref 4.0–10.5)

## 2015-10-06 LAB — COMPREHENSIVE METABOLIC PANEL
ALT: 10 U/L — AB (ref 14–54)
AST: 16 U/L (ref 15–41)
Albumin: 3.2 g/dL — ABNORMAL LOW (ref 3.5–5.0)
Alkaline Phosphatase: 98 U/L (ref 38–126)
Anion gap: 7 (ref 5–15)
BUN: 33 mg/dL — ABNORMAL HIGH (ref 6–20)
CHLORIDE: 105 mmol/L (ref 101–111)
CO2: 26 mmol/L (ref 22–32)
CREATININE: 1.45 mg/dL — AB (ref 0.44–1.00)
Calcium: 8.8 mg/dL — ABNORMAL LOW (ref 8.9–10.3)
GFR calc non Af Amer: 34 mL/min — ABNORMAL LOW (ref >60)
GFR, EST AFRICAN AMERICAN: 39 mL/min — AB (ref >60)
Glucose, Bld: 95 mg/dL (ref 65–99)
Potassium: 3.7 mmol/L (ref 3.5–5.1)
SODIUM: 138 mmol/L (ref 135–145)
Total Bilirubin: 0.4 mg/dL (ref 0.3–1.2)
Total Protein: 7.5 g/dL (ref 6.5–8.1)

## 2015-10-11 ENCOUNTER — Other Ambulatory Visit (HOSPITAL_COMMUNITY): Payer: Self-pay | Admitting: *Deleted

## 2015-10-11 DIAGNOSIS — D509 Iron deficiency anemia, unspecified: Secondary | ICD-10-CM

## 2015-10-11 DIAGNOSIS — D638 Anemia in other chronic diseases classified elsewhere: Secondary | ICD-10-CM

## 2015-10-12 ENCOUNTER — Encounter (HOSPITAL_COMMUNITY): Payer: Medicare Other

## 2015-10-12 ENCOUNTER — Other Ambulatory Visit (HOSPITAL_COMMUNITY): Payer: Self-pay | Admitting: Oncology

## 2015-10-12 DIAGNOSIS — M069 Rheumatoid arthritis, unspecified: Secondary | ICD-10-CM

## 2015-10-12 DIAGNOSIS — D638 Anemia in other chronic diseases classified elsewhere: Secondary | ICD-10-CM

## 2015-10-12 DIAGNOSIS — D649 Anemia, unspecified: Secondary | ICD-10-CM | POA: Diagnosis not present

## 2015-10-12 LAB — CBC WITH DIFFERENTIAL/PLATELET
BASOS ABS: 0.1 10*3/uL (ref 0.0–0.1)
Basophils Relative: 1 %
EOS ABS: 0.2 10*3/uL (ref 0.0–0.7)
EOS PCT: 4 %
HCT: 24.8 % — ABNORMAL LOW (ref 36.0–46.0)
Hemoglobin: 7.9 g/dL — ABNORMAL LOW (ref 12.0–15.0)
LYMPHS ABS: 1.6 10*3/uL (ref 0.7–4.0)
LYMPHS PCT: 30 %
MCH: 25.8 pg — AB (ref 26.0–34.0)
MCHC: 31.9 g/dL (ref 30.0–36.0)
MCV: 81 fL (ref 78.0–100.0)
MONO ABS: 0.7 10*3/uL (ref 0.1–1.0)
Monocytes Relative: 13 %
Neutro Abs: 2.7 10*3/uL (ref 1.7–7.7)
Neutrophils Relative %: 52 %
Platelets: 238 10*3/uL (ref 150–400)
RBC: 3.06 MIL/uL — ABNORMAL LOW (ref 3.87–5.11)
RDW: 18.9 % — ABNORMAL HIGH (ref 11.5–15.5)
WBC: 5.2 10*3/uL (ref 4.0–10.5)

## 2015-10-12 LAB — PREPARE RBC (CROSSMATCH)

## 2015-10-12 LAB — COMPREHENSIVE METABOLIC PANEL
ALK PHOS: 94 U/L (ref 38–126)
ALT: 8 U/L — AB (ref 14–54)
AST: 14 U/L — ABNORMAL LOW (ref 15–41)
Albumin: 3.4 g/dL — ABNORMAL LOW (ref 3.5–5.0)
Anion gap: 8 (ref 5–15)
BILIRUBIN TOTAL: 0.3 mg/dL (ref 0.3–1.2)
BUN: 30 mg/dL — ABNORMAL HIGH (ref 6–20)
CALCIUM: 8.7 mg/dL — AB (ref 8.9–10.3)
CO2: 24 mmol/L (ref 22–32)
CREATININE: 1.61 mg/dL — AB (ref 0.44–1.00)
Chloride: 106 mmol/L (ref 101–111)
GFR, EST AFRICAN AMERICAN: 34 mL/min — AB (ref >60)
GFR, EST NON AFRICAN AMERICAN: 30 mL/min — AB (ref >60)
Glucose, Bld: 105 mg/dL — ABNORMAL HIGH (ref 65–99)
Potassium: 4 mmol/L (ref 3.5–5.1)
Sodium: 138 mmol/L (ref 135–145)
Total Protein: 7.6 g/dL (ref 6.5–8.1)

## 2015-10-13 ENCOUNTER — Encounter (HOSPITAL_COMMUNITY)
Admission: RE | Admit: 2015-10-13 | Discharge: 2015-10-13 | Disposition: A | Discharge status: Home / Self Care | Payer: Medicare Other | Source: Ambulatory Visit | Attending: Oncology | Admitting: Oncology

## 2015-10-13 ENCOUNTER — Other Ambulatory Visit (HOSPITAL_COMMUNITY): Payer: Medicare Other

## 2015-10-13 VITALS — BP 119/48 | HR 65 | Temp 97.9°F | Resp 16

## 2015-10-13 DIAGNOSIS — D638 Anemia in other chronic diseases classified elsewhere: Secondary | ICD-10-CM | POA: Diagnosis present

## 2015-10-13 MED ORDER — DIPHENHYDRAMINE HCL 25 MG PO CAPS
25.0000 mg | ORAL_CAPSULE | Freq: Once | ORAL | Status: AC
  Administered 2015-10-13: 25 mg via ORAL

## 2015-10-13 MED ORDER — ACETAMINOPHEN 325 MG PO TABS
ORAL_TABLET | ORAL | Status: AC
  Filled 2015-10-13: qty 2

## 2015-10-13 MED ORDER — DIPHENHYDRAMINE HCL 25 MG PO CAPS
ORAL_CAPSULE | ORAL | Status: AC
  Filled 2015-10-13: qty 1

## 2015-10-13 MED ORDER — ACETAMINOPHEN 325 MG PO TABS
650.0000 mg | ORAL_TABLET | Freq: Once | ORAL | Status: AC
  Administered 2015-10-13: 650 mg via ORAL

## 2015-10-13 MED ORDER — SODIUM CHLORIDE 0.9 % IV SOLN
250.0000 mL | Freq: Once | INTRAVENOUS | Status: AC
  Administered 2015-10-13: 250 mL via INTRAVENOUS

## 2015-10-13 NOTE — Discharge Instructions (Signed)
Blood Transfusion  °A blood transfusion is a procedure that gives you donated blood through an IV tube. You may need blood because of illness, surgery, or injury. The blood may come from a donor. The blood may also be your own blood that you donated earlier. °The blood you get is made up of different types of cells. You may get:  °· Red blood cells. These carry oxygen and replace lost blood.   °· Platelets. These control bleeding.   °· Plasma. This helps blood to clot. °If you have a clotting disorder, you may also get other types of blood products.  °BEFORE THE PROCEDURE °· You may have a blood test. This finds out what type of blood you have. It also finds out what kind of blood your body will accept.   °· If you are going to have a planned surgery, you may donate your own blood. This is done in case you need to have a transfusion.   °· If you have had an allergic transfusion reaction before, you may be given medicine to help prevent a reaction. Take this medicine only as told by your doctor. °· You will have your temperature, blood pressure, and pulse checked. °PROCEDURE  °· An IV will be started in your hand or arm.   °· The bag of donated blood will be attached to your IV and run into your vein.   °· A doctor will regularly check your temperature, blood pressure, and pulse during the procedure. This is done to find any early signs of a transfusion reaction. °· If you have any signs or symptoms of a reaction, the procedure may be stopped and you may be given medicine.   °· When the transfusion is over, your IV will be removed.   °· Pressure may be applied to the IV site for a few minutes.   °· A bandage (dressing) will be applied.   °The procedure may vary among doctors and hospitals.  °AFTER THE PROCEDURE °· Your blood pressure, temperature, and pulse will be checked regularly. °  °This information is not intended to replace advice given to you by your health care provider. Make sure you discuss any questions  you have with your health care provider. °  °Document Released: 07/06/2008 Document Revised: 04/30/2014 Document Reviewed: 02/17/2014 °Elsevier Interactive Patient Education ©2016 Elsevier Inc. ° °Blood Transfusion, Care After °Refer to this sheet in the next few weeks. These instructions provide you with information about caring for yourself after your procedure. Your health care provider may also give you more specific instructions. Your treatment has been planned according to current medical practices, but problems sometimes occur. Call your health care provider if you have any problems or questions after your procedure. °WHAT TO EXPECT AFTER THE PROCEDURE °After your procedure, it is common to have: °· Bruising and soreness at the IV site. °· Chills or fever. °· Headache. °HOME CARE INSTRUCTIONS °· Take medicines only as directed by your health care provider. Ask your health care provider if you can take an over-the-counter pain reliever in case you have a fever or headache a day or two after your transfusion. °· Return to your normal activities as directed by your health care provider. °SEEK MEDICAL CARE IF:  °· You develop redness or irritation at your IV site. °· You have persistent fever, chills, or headache. °· Your urine is darker than normal. °· Your urine turns pink, red, or brown.   °· The white part of your eye turns yellow (jaundice).   °· You feel weak after doing your   normal activities.   °SEEK IMMEDIATE MEDICAL CARE IF:  °· You have trouble breathing. °· You have fever and chills along with: °¨ Anxiety. °¨ Chest or back pain. °¨ Flushed skin. °¨ Clammy skin. °¨ A rapid heartbeat. °¨ Nausea. °  °This information is not intended to replace advice given to you by your health care provider. Make sure you discuss any questions you have with your health care provider. °  °Document Released: 04/30/2014 Document Reviewed: 04/30/2014 °Elsevier Interactive Patient Education ©2016 Elsevier Inc. ° °

## 2015-10-14 LAB — TYPE AND SCREEN
ABO/RH(D): AB POS
ANTIBODY SCREEN: NEGATIVE
Unit division: 0

## 2015-10-18 ENCOUNTER — Ambulatory Visit (INDEPENDENT_AMBULATORY_CARE_PROVIDER_SITE_OTHER): Payer: Medicare Other

## 2015-10-18 DIAGNOSIS — I5022 Chronic systolic (congestive) heart failure: Secondary | ICD-10-CM

## 2015-10-18 DIAGNOSIS — Z95810 Presence of automatic (implantable) cardiac defibrillator: Secondary | ICD-10-CM

## 2015-10-18 NOTE — Progress Notes (Signed)
EPIC Encounter for ICM Monitoring  Patient Name: [name] is a 75 y.o. female Date: 10/18/2015 Primary Care Physican: [name], MD Primary Cardiologist: [name] Electrophysiologist: Allred Dry Weight: 140 lbs   Bi-V Pacing 93.4%      In the past month, have you:  1. Gained more than 2 pounds in a day or more than 5 pounds in a week? N 2. Had changes in your medications (with verification of current medications)? No  3. Had more shortness of breath than is usual for you? No   4. Limited your activity because of shortness of breath? No   5. Not been able to sleep because of shortness of breath? No   6. Had increased swelling in your feet, ankles, legs or stomach area? No   7. Had symptoms of dehydration (dizziness, dry mouth, increased thirst, decreased urine output) No   8. Had changes in sodium restriction? No  9. Been compliant with medication? Yes   ICM trend: 3 month view for 10/18/2015   ICM trend: 1 year view for 10/18/2015   Follow-up plan: ICM clinic phone appointment 12/01/2015.  Office appointment with Dr Lovena Le on 10/31/2015.  Spoke with nephew, [name].  FLUID LEVELS: Optivol thoracic impedance decreased 09/22/2015 to 10/16/2015 suggesting fluid accumulation and returned to baseline 10/16/2015.    SYMPTOMS:  None, denied any fluid symptoms such as weight gain of 3 pounds overnight or 5 pounds within a week, SOB and/or lower extremity swelling. Encouraged to call for any fluid symptoms.   EDUCATION: Limit sodium intake to < 2000 mg and fluid intake to 64 oz daily.     RECOMMENDATIONS: No changes today.     [name], RN, CCM 10/18/2015 2:42 PM

## 2015-10-20 ENCOUNTER — Other Ambulatory Visit (HOSPITAL_COMMUNITY): Payer: Medicare Other

## 2015-10-21 ENCOUNTER — Encounter (HOSPITAL_COMMUNITY): Payer: Medicare Other | Attending: Hematology & Oncology

## 2015-10-21 DIAGNOSIS — M069 Rheumatoid arthritis, unspecified: Secondary | ICD-10-CM | POA: Diagnosis not present

## 2015-10-21 LAB — CBC WITH DIFFERENTIAL/PLATELET
BASOS PCT: 1 %
Basophils Absolute: 0.1 10*3/uL (ref 0.0–0.1)
Eosinophils Absolute: 0.4 10*3/uL (ref 0.0–0.7)
Eosinophils Relative: 6 %
HEMATOCRIT: 27.5 % — AB (ref 36.0–46.0)
Hemoglobin: 8.5 g/dL — ABNORMAL LOW (ref 12.0–15.0)
LYMPHS ABS: 1.5 10*3/uL (ref 0.7–4.0)
Lymphocytes Relative: 21 %
MCH: 25.5 pg — AB (ref 26.0–34.0)
MCHC: 30.9 g/dL (ref 30.0–36.0)
MCV: 82.6 fL (ref 78.0–100.0)
MONO ABS: 0.7 10*3/uL (ref 0.1–1.0)
MONOS PCT: 11 %
NEUTROS ABS: 4.3 10*3/uL (ref 1.7–7.7)
Neutrophils Relative %: 61 %
Platelets: 206 10*3/uL (ref 150–400)
RBC: 3.33 MIL/uL — ABNORMAL LOW (ref 3.87–5.11)
RDW: 18 % — AB (ref 11.5–15.5)
WBC: 7 10*3/uL (ref 4.0–10.5)

## 2015-10-21 LAB — COMPREHENSIVE METABOLIC PANEL
ALBUMIN: 3.3 g/dL — AB (ref 3.5–5.0)
ALT: 10 U/L — ABNORMAL LOW (ref 14–54)
ANION GAP: 5 (ref 5–15)
AST: 17 U/L (ref 15–41)
Alkaline Phosphatase: 84 U/L (ref 38–126)
BILIRUBIN TOTAL: 0.4 mg/dL (ref 0.3–1.2)
BUN: 35 mg/dL — ABNORMAL HIGH (ref 6–20)
CALCIUM: 8.7 mg/dL — AB (ref 8.9–10.3)
CO2: 26 mmol/L (ref 22–32)
Chloride: 105 mmol/L (ref 101–111)
Creatinine, Ser: 2.01 mg/dL — ABNORMAL HIGH (ref 0.44–1.00)
GFR, EST AFRICAN AMERICAN: 26 mL/min — AB (ref >60)
GFR, EST NON AFRICAN AMERICAN: 23 mL/min — AB (ref >60)
Glucose, Bld: 102 mg/dL — ABNORMAL HIGH (ref 65–99)
POTASSIUM: 5 mmol/L (ref 3.5–5.1)
Sodium: 136 mmol/L (ref 135–145)
TOTAL PROTEIN: 7.5 g/dL (ref 6.5–8.1)

## 2015-10-27 ENCOUNTER — Ambulatory Visit (INDEPENDENT_AMBULATORY_CARE_PROVIDER_SITE_OTHER): Payer: Medicare Other | Admitting: Adult Health

## 2015-10-27 ENCOUNTER — Encounter: Payer: Self-pay | Admitting: Adult Health

## 2015-10-27 VITALS — BP 112/64 | HR 85 | Ht 65.0 in | Wt 147.0 lb

## 2015-10-27 DIAGNOSIS — I2510 Atherosclerotic heart disease of native coronary artery without angina pectoris: Secondary | ICD-10-CM

## 2015-10-27 DIAGNOSIS — I10 Essential (primary) hypertension: Secondary | ICD-10-CM | POA: Diagnosis not present

## 2015-10-27 DIAGNOSIS — I5022 Chronic systolic (congestive) heart failure: Secondary | ICD-10-CM

## 2015-10-27 NOTE — Patient Instructions (Addendum)
Your physician recommends that you schedule a follow-up appointment in: 3 Months with Dr. Lovena Le.   Your physician recommends that you continue on your current medications as directed. Please refer to the Current Medication list given to you today..   May take Benadryl 12.5 mg for Itching.   If you need a refill on your cardiac medications before your next appointment, please call your pharmacy.  Thank you for choosing [facility]!

## 2015-10-27 NOTE — Progress Notes (Signed)
Name: CLINTONA FOULKE    DOB: January 31, 1941  Age: 75 y.o.  MR#: [id number]       PCP:  [name], MD      Insurance: Payor: [employer] MEDICARE / Plan: [facility] MEDICARE / Product Type: *No Product type* /   CC:   No chief complaint on file.   VS Filed Vitals:   10/27/15 1310  BP: 112/64  Pulse: 85  Height: 5\' 5"  (1.651 m)  Weight: 147 lb (66.679 kg)  SpO2: 96%    Weights Current Weight  10/27/15 147 lb (66.679 kg)  09/29/15 148 lb (67.132 kg)  09/26/15 145 lb (65.772 kg)    Blood Pressure  BP Readings from Last 3 Encounters:  10/27/15 112/64  10/13/15 119/48  09/29/15 110/70     Admit date:  (Not on file) Last encounter with RMR:  09/26/2015   Allergy Review of patient's allergies indicates no known allergies.  Current Outpatient Prescriptions  Medication Sig Dispense Refill  . acetaminophen (TYLENOL) 500 MG tablet Take 1,000 mg by mouth every 6 (six) hours as needed.     [name] aspirin EC 81 MG tablet Take 81 mg by mouth daily.    . carvedilol (COREG) 6.25 MG tablet TAKE 1 TABLET BY MOUTH TWICE A DAY WITH MEALS 60 tablet 6  . folic acid (FOLVITE) 1 MG tablet Take 1 mg by mouth daily.    . furosemide (LASIX) 40 MG tablet TAKE 1 TABLET BY MOUTH TWICE A DAY (Patient taking differently: TAKE 2 TABLET BY MOUTH TWICE A DAY) 60 tablet 6  . hydroxychloroquine (PLAQUENIL) 200 MG tablet Take 100-200 mg by mouth 2 (two) times daily. Takes a whole tablet in the morning and half a tablet in the evening .    [name] KLOR-CON M10 10 MEQ tablet TAKE 1 TABLET BY MOUTH TWICE A DAY 60 tablet 6  . lisinopril (PRINIVIL,ZESTRIL) 20 MG tablet Take 20 mg by mouth daily.    [name] lovastatin (MEVACOR) 40 MG tablet TAKE 1 TABLET (40 MG TOTAL) BY MOUTH AT BEDTIME. 30 tablet 6  . omeprazole (PRILOSEC) 20 MG capsule Take 20 mg by mouth daily. For gas/heartburn    . PROAIR HFA 108 (90 BASE) MCG/ACT inhaler Inhale 2 puffs into the lungs every 4 (four) hours as needed for wheezing or shortness of breath. Reported on  08/26/2015     No current facility-administered medications for this visit.    Discontinued Meds:    Medications Discontinued During This Encounter  Medication Reason  . lisinopril (PRINIVIL,ZESTRIL) 10 MG tablet Error    Patient Active Problem List   Diagnosis Date Noted  . CAD S/P remote PCI- no details 09/02/2014  . Cardiomyopathy, ischemic-EF 30-35% March 2015 09/02/2014  . Diastolic dysfunction-grade 2 09/02/2014  . CHF exacerbation ([place]) 08/28/2014  . Acute on chronic combined systolic and diastolic congestive heart failure ([place]) 08/28/2014  . Iron deficiency anemia 08/20/2013  . Malnutrition of moderate degree ([place]) 07/21/2013  . PNA (pneumonia) 07/19/2013  . HCAP (healthcare-associated pneumonia) 07/17/2013  . Sepsis ([place]) 07/17/2013  . ARF (acute renal failure) ([place]) 07/17/2013  . Rheumatoid arthritis ([place]) 07/04/2013  . Hypokalemia 07/03/2013  . Pain and swelling of wrist 07/03/2013  . Chest pain 07/03/2013  . Biventricular ICD in place (MDT 2014) 11/06/2012  . Anemia of chronic disease   . Breast cancer ([place])   . Hypertension   . Dyslipidemia 05/24/2009  . Chronic systolic heart failure (HCC) 05/24/2009    LABS    Component  Value Date/Time   NA 136 10/21/2015 1023   NA 138 10/12/2015 0920   NA 138 10/06/2015 1115   K 5.0 10/21/2015 1023   K 4.0 10/12/2015 0920   K 3.7 10/06/2015 1115   CL 105 10/21/2015 1023   CL 106 10/12/2015 0920   CL 105 10/06/2015 1115   CO2 26 10/21/2015 1023   CO2 24 10/12/2015 0920   CO2 26 10/06/2015 1115   GLUCOSE 102* 10/21/2015 1023   GLUCOSE 105* 10/12/2015 0920   GLUCOSE 95 10/06/2015 1115   BUN 35* 10/21/2015 1023   BUN 30* 10/12/2015 0920   BUN 33* 10/06/2015 1115   CREATININE 2.01* 10/21/2015 1023   CREATININE 1.61* 10/12/2015 0920   CREATININE 1.45* 10/06/2015 1115   CREATININE 1.23* 09/17/2014 1347   CREATININE 0.76 09/11/2013 1005   CREATININE 0.90 07/14/2012 1330   CALCIUM 8.7* 10/21/2015 1023   CALCIUM  8.7* 10/12/2015 0920   CALCIUM 8.8* 10/06/2015 1115   GFRNONAA 23* 10/21/2015 1023   GFRNONAA 30* 10/12/2015 0920   GFRNONAA 34* 10/06/2015 1115   GFRAA 26* 10/21/2015 1023   GFRAA 34* 10/12/2015 0920   GFRAA 39* 10/06/2015 1115   CMP     Component Value Date/Time   NA 136 10/21/2015 1023   K 5.0 10/21/2015 1023   CL 105 10/21/2015 1023   CO2 26 10/21/2015 1023   GLUCOSE 102* 10/21/2015 1023   BUN 35* 10/21/2015 1023   CREATININE 2.01* 10/21/2015 1023   CREATININE 1.23* 09/17/2014 1347   CALCIUM 8.7* 10/21/2015 1023   PROT 7.5 10/21/2015 1023   ALBUMIN 3.3* 10/21/2015 1023   AST 17 10/21/2015 1023   ALT 10* 10/21/2015 1023   ALKPHOS 84 10/21/2015 1023   BILITOT 0.4 10/21/2015 1023   GFRNONAA 23* 10/21/2015 1023   GFRAA 26* 10/21/2015 1023       Component Value Date/Time   WBC 7.0 10/21/2015 1023   WBC 5.2 10/12/2015 0920   WBC 4.6 10/06/2015 1115   HGB 8.5* 10/21/2015 1023   HGB 7.9* 10/12/2015 0920   HGB 7.6* 10/06/2015 1115   HCT 27.5* 10/21/2015 1023   HCT 24.8* 10/12/2015 0920   HCT 24.1* 10/06/2015 1115   MCV 82.6 10/21/2015 1023   MCV 81.0 10/12/2015 0920   MCV 79.8 10/06/2015 1115    Lipid Panel     Component Value Date/Time   CHOL 134 05/23/2012 0905   TRIG 103 05/23/2012 0905   HDL 30* 05/23/2012 0905   CHOLHDL 4.5 05/23/2012 0905   VLDL 21 05/23/2012 0905   LDLCALC 83 05/23/2012 0905    ABG No results found for: PHART, PCO2ART, PO2ART, HCO3, TCO2, ACIDBASEDEF, O2SAT   Lab Results  Component Value Date   TSH 0.501 07/03/2013   BNP (last 3 results) No results for input(s): BNP in the last 8760 hours.  ProBNP (last 3 results) No results for input(s): PROBNP in the last 8760 hours.  Cardiac Panel (last 3 results) No results for input(s): CKTOTAL, CKMB, TROPONINI, RELINDX in the last 72 hours.  Iron/TIBC/Ferritin/ %Sat    Component Value Date/Time   IRON 31 02/17/2015 1512   TIBC 185* 02/17/2015 1512   FERRITIN 356* 03/31/2015 0853    IRONPCTSAT 17 02/17/2015 1512     EKG Orders placed or performed in visit on 09/26/15  . EKG 12-Lead     Prior Assessment and Plan Problem List as of 10/27/2015      Cardiovascular and Mediastinum   Chronic systolic heart failure ([place])  Last Assessment & Plan 09/10/2014 Office Visit Written 09/10/2014  9:49 AM by Evans Lance, MD    Her chronic systolic heart failure is well compensated. She will continue her current meds. Her fluid index is normal.       Hypertension   Last Assessment & Plan 09/10/2014 Office Visit Written 09/10/2014  9:49 AM by Evans Lance, MD    Her blood pressure is well controlled. No change in meds.       CAD S/P remote PCI- no details   Cardiomyopathy, ischemic-EF 30-35% March 2015   CHF exacerbation (HCC)   Acute on chronic combined systolic and diastolic congestive heart failure (HCC)     Respiratory   HCAP (healthcare-associated pneumonia)   PNA (pneumonia)     Musculoskeletal and Integument   Rheumatoid arthritis [facility])   Last Assessment & Plan 09/23/2015 Office Visit Written 09/23/2015  6:30 PM by Baird Cancer, PA-C    Followed and managed by Dr. Leary Roca (Rheum)          Genitourinary   ARF (acute renal failure) [facility])     Other   Dyslipidemia   Last Assessment & Plan 06/09/2012 Office Visit Edited 06/09/2012  5:53 PM by Yehuda Savannah, MD    Acceptable lipid profile last year, but as long she requires treatment with a statin, a higher dose will produce even a more favorable effect on her serum lipids and will be ordered.      Anemia of chronic disease   Last Assessment & Plan 09/23/2015 Office Visit Written 09/23/2015  6:29 PM by Baird Cancer, PA-C    Iron deficiency anemia in the setting of Stage II chronic renal disease, requiring infrequent IV replacement, and rheumatoid arthritis followed by Dr. Leary Roca (Rheum).  S/P EGD/colonoscopy by Dr. Laural Golden on 03/04/2015 demonstrating a small AVM in duodenum, sub-optimal prep  on colonoscopy with small polyps which could have hindered the ability to identify AVMs. No camera capsule study at this time.  Labs in 2 and 4 weeks: CBC diff, CMET.  Labs in 12 weeks: CBC diff, CMET, iron/TIBC, ferritin.  Return in 12 weeks for follow-up.      Biventricular ICD in place (MDT 2014)   Last Assessment & Plan 09/10/2014 Office Visit Written 09/10/2014  9:50 AM by Evans Lance, MD    Her medtronic biv ICD is working normally. Will recheck in several months.      Diastolic dysfunction-grade 2   Breast cancer (HCC)   Hypokalemia   Pain and swelling of wrist   Chest pain   Sepsis (HCC)   Malnutrition of moderate degree (HCC)   Iron deficiency anemia   Last Assessment & Plan 06/03/2014 Office Visit Written 06/21/2014  6:17 PM by Molli Hazard, MD    75 year old female who presented with microcytic anemia and iron deficiency. In spite of iron replacement she remains quite anemic. She has evidence of only mild chronic kidney disease. Fecal occult blood was negative back in March 2015. There is no recent colonoscopy or EGD documented in Epic. I recommended a more extensive anemia evaluation to which she agrees. We will draw additional laboratory studies today and I will keep her apprised results when available. If all of her laboratory studies are normal we will discuss additional valuation at her follow-up when she returns in 2 months.          Imaging: No results found.

## 2015-10-27 NOTE — Progress Notes (Signed)
Cardiology Office Note   Date:  10/27/2015   ID:  [name], DOB 1941-02-16, MRN [id number]  PCP:  [name], MD  Cardiologist: Bryna Colander, NP   No chief complaint on file.     History of Present Illness: [name] is a 75 y.o. female who presents for for ongoing assessment and management of chronic systolic heart failure, CAD, ICD in situ (Medtronic), hypertension, with chronic left bundle branch block.patient and chronic complaints of dizziness and tiredness with anxiety. On last office visit no changes were made. She is being treated by hematology for chronic iron deficient anemia. She complained of palpitations and was found to be hypotensive. She was advised to decrease lisinopril to 10 mg daily.   She comes today complaining of blurred vision and scalp itching with watery eyes. She is taking the full dose of lisinopril 20 mg daily instead of the lower 10 mg dose as directed. On review of her bottles her bottle does say 20 mg one a day. Even though we had changed in the last visit. Pharmacy will need to be contacted. She is being followed by Dr. Legrand Rams and has had labs drawn yesterday.  Past Medical History  Diagnosis Date  . Arteriosclerotic cardiovascular disease (ASCVD)     Remote PTCA by patient report; LBBB; associated cardiomyopathy, presumed ischemic with EF 40-45% previously, 20% in 06/2009; h/o clinical congestive heart failure; negative stress nuclear in 2009 with inferoseptal and apical scar  . Anemia     Hgb of 9-10  . Hyperlipidemia   . Breast cancer ([place])   . Hypertension   . Automatic implantable cardioverter-defibrillator in situ   . HOH (hard of hearing)   . Anemia of chronic disease     Hgb of 9-10 chronically; 06/2010: H&H-10.7/33.5, MCV-81, normal iron studies in 2010     Past Surgical History  Procedure Laterality Date  . Total knee arthroplasty Right     Dr.Harrison  . Bi-ventricular implantable cardioverter defibrillator  (crt-d)   07/28/2012  . Cataract extraction w/ intraocular lens implant Left   . Tubal ligation    . Breast biopsy Bilateral   . Mastectomy Left 1998  . Cataract extraction w/phaco Right 09/15/2013    Procedure: CATARACT EXTRACTION PHACO AND INTRAOCULAR LENS PLACEMENT (IOC);  Surgeon: Elta Guadeloupe T. Gershon Crane, MD;  Location: [facility] OR[facility];  Service: Ophthalmology;  Laterality: Right;  CDE:  10.74  . Bi-ventricular implantable cardioverter defibrillator N/A 07/28/2012    Procedure: BI-VENTRICULAR IMPLANTABLE CARDIOVERTER DEFIBRILLATOR  (CRT-D);  Surgeon: Evans Lance, MD;  Location: [facility] CATH LAB;  Service: Cardiovascular;  Laterality: N/A;  . Colonoscopy N/A 03/04/2015    Procedure: COLONOSCOPY;  Surgeon: Rogene Houston, MD;  Location: [facility] ENDO SUITE;  Service: Endoscopy;  Laterality: N/A;  10:50   . Esophagogastroduodenoscopy N/A 03/04/2015    Procedure: ESOPHAGOGASTRODUODENOSCOPY (EGD);  Surgeon: Rogene Houston, MD;  Location: [facility] ENDO SUITE;  Service: Endoscopy;  Laterality: N/A;     Current Outpatient Prescriptions  Medication Sig Dispense Refill  . acetaminophen (TYLENOL) 500 MG tablet Take 1,000 mg by mouth every 6 (six) hours as needed.     [name] aspirin EC 81 MG tablet Take 81 mg by mouth daily.    . carvedilol (COREG) 6.25 MG tablet TAKE 1 TABLET BY MOUTH TWICE A DAY WITH MEALS 60 tablet 6  . folic acid (FOLVITE) 1 MG tablet Take 1 mg by mouth daily.    . furosemide (LASIX) 40 MG tablet TAKE 1 TABLET  BY MOUTH TWICE A DAY (Patient taking differently: TAKE 2 TABLET BY MOUTH TWICE A DAY) 60 tablet 6  . hydroxychloroquine (PLAQUENIL) 200 MG tablet Take 100-200 mg by mouth 2 (two) times daily. Takes a whole tablet in the morning and half a tablet in the evening .    [name] KLOR-CON M10 10 MEQ tablet TAKE 1 TABLET BY MOUTH TWICE A DAY 60 tablet 6  . lisinopril (PRINIVIL,ZESTRIL) 20 MG tablet Take 20 mg by mouth daily.    [name] lovastatin (MEVACOR) 40 MG tablet TAKE 1 TABLET (40 MG TOTAL) BY MOUTH AT BEDTIME. 30 tablet 6  .  omeprazole (PRILOSEC) 20 MG capsule Take 20 mg by mouth daily. For gas/heartburn    . PROAIR HFA 108 (90 BASE) MCG/ACT inhaler Inhale 2 puffs into the lungs every 4 (four) hours as needed for wheezing or shortness of breath. Reported on 08/26/2015     No current facility-administered medications for this visit.    Allergies:   Review of patient's allergies indicates no known allergies.    Social History:  The patient  reports that she quit smoking about 8 years ago. Her smoking use included Cigarettes. She started smoking about 59 years ago. She smoked 0.25 packs per day. Her smokeless tobacco use includes Chew. She reports that she does not drink alcohol or use illicit drugs.   Family History:  The patient's family history includes Diabetes in her father; Hypertension in her brother; Pancreatic cancer in her father.    ROS: All other systems are reviewed and negative. Unless otherwise mentioned in H&P    PHYSICAL EXAM: VS:  BP 112/64 mmHg  Pulse 85  Ht 5\' 5"  (1.651 m)  Wt 147 lb (66.679 kg)  BMI 24.46 kg/m2  SpO2 96% , BMI Body mass index is 24.46 kg/(m^2). GEN: Well nourished, well developed, in no acute distress HEENT: normal Neck: no JVD, carotid bruits, or masses Cardiac: RRR; 2/6 holosystolic murmur, rubs, or gallops,no edema  Respiratory:  clear to auscultation bilaterally, normal work of breathing GI: soft, nontender, nondistended, + BS MS: no deformity or atrophy Skin: warm and dry, no rash Neuro:  Strength and sensation are intact Psych: euthymic mood, full affect   Recent Labs: 10/21/2015: ALT 10*; BUN 35*; Creatinine, Ser 2.01*; Hemoglobin 8.5*; Platelets 206; Potassium 5.0; Sodium 136    Lipid Panel    Component Value Date/Time   CHOL 134 05/23/2012 0905   TRIG 103 05/23/2012 0905   HDL 30* 05/23/2012 0905   CHOLHDL 4.5 05/23/2012 0905   VLDL 21 05/23/2012 0905   LDLCALC 83 05/23/2012 0905      Wt Readings from Last 3 Encounters:  10/27/15 147 lb (66.679  kg)  09/29/15 148 lb (67.132 kg)  09/26/15 145 lb (65.772 kg)     ASSESSMENT AND PLAN:  1. Hypertension: blood pressure is soft and she for slight continues to take a higher dose of lisinopril instead of the lower dose of 10 mg daily.we will notify her pharmacy to change the dose and I have asked her to decrease it to 10 mg a day by cutting her 20 mg tablets in half. Labs are followed by Dr. Legrand Rams with most recent lab on 10/21/2015 revealing a creatinine of 2.0.she is lightheaded dizzy and having blurred vision.I have given her Benadryl 12.5 mg to take twice a day when necessary itching.  2. Systolic dysfunction: most recent echocardiogram in March of 2017 demonstrated EF of 30-35% with diffuse hypokinesis. Grade 1 diastolic dysfunction. Her  aortic valve is mildly to moderately calcified and trileaflet. There was mild mitral regurg.she will need followup echocardiogram on next visit.  3. CAD: she has no complaints of angina or dyspnea. Continue carvedilol 6.25 mg twice a day and aspirin. Current medicines are reviewed at length with the patient today.    Labs/ tests ordered today include:  No orders of the defined types were placed in this encounter.     Disposition:   FU with 3 months Signed, Jory Sims, NP  10/27/2015 1:18 PM    [facility] [address] [place], [place] [address] Phone: [phone]; Fax: [phone]

## 2015-10-31 ENCOUNTER — Ambulatory Visit (INDEPENDENT_AMBULATORY_CARE_PROVIDER_SITE_OTHER): Payer: Medicare Other | Admitting: Internal Medicine

## 2015-10-31 ENCOUNTER — Encounter: Payer: Self-pay | Admitting: Internal Medicine

## 2015-10-31 VITALS — BP 118/64 | HR 87 | Ht 65.0 in | Wt 148.0 lb

## 2015-10-31 DIAGNOSIS — I255 Ischemic cardiomyopathy: Secondary | ICD-10-CM | POA: Diagnosis not present

## 2015-10-31 DIAGNOSIS — I5022 Chronic systolic (congestive) heart failure: Secondary | ICD-10-CM | POA: Diagnosis not present

## 2015-10-31 LAB — CUP PACEART INCLINIC DEVICE CHECK
Brady Statistic AP VP Percent: 15.77 %
Brady Statistic AP VS Percent: 1.04 %
Brady Statistic AS VP Percent: 81.41 %
Brady Statistic RV Percent Paced: 3.32 %
Date Time Interrogation Session: 20170710105142
HighPow Impedance: 74 Ohm
Implantable Lead Implant Date: 20140407
Implantable Lead Implant Date: 20140407
Implantable Lead Location: 753860
Implantable Lead Model: 6935
Lead Channel Impedance Value: 342 Ohm
Lead Channel Impedance Value: 646 Ohm
Lead Channel Impedance Value: 893 Ohm
Lead Channel Pacing Threshold Amplitude: 0.5 V
Lead Channel Pacing Threshold Pulse Width: 0.4 ms
Lead Channel Sensing Intrinsic Amplitude: 2.375 mV
Lead Channel Sensing Intrinsic Amplitude: 2.375 mV
MDC IDC LEAD IMPLANT DT: 20140407
MDC IDC LEAD LOCATION: 753858
MDC IDC LEAD LOCATION: 753859
MDC IDC LEAD MODEL: 4194
MDC IDC MSMT BATTERY REMAINING LONGEVITY: 76 mo
MDC IDC MSMT BATTERY VOLTAGE: 2.98 V
MDC IDC MSMT LEADCHNL LV PACING THRESHOLD AMPLITUDE: 0.5 V
MDC IDC MSMT LEADCHNL LV PACING THRESHOLD PULSEWIDTH: 0.5 ms
MDC IDC MSMT LEADCHNL RA IMPEDANCE VALUE: 399 Ohm
MDC IDC MSMT LEADCHNL RV IMPEDANCE VALUE: 399 Ohm
MDC IDC MSMT LEADCHNL RV IMPEDANCE VALUE: 513 Ohm
MDC IDC MSMT LEADCHNL RV PACING THRESHOLD AMPLITUDE: 1 V
MDC IDC MSMT LEADCHNL RV PACING THRESHOLD PULSEWIDTH: 0.4 ms
MDC IDC MSMT LEADCHNL RV SENSING INTR AMPL: 27.25 mV
MDC IDC MSMT LEADCHNL RV SENSING INTR AMPL: 28.875 mV
MDC IDC SET LEADCHNL LV PACING AMPLITUDE: 1.5 V
MDC IDC SET LEADCHNL LV PACING PULSEWIDTH: 0.5 ms
MDC IDC SET LEADCHNL RA PACING AMPLITUDE: 2 V
MDC IDC SET LEADCHNL RV SENSING SENSITIVITY: 0.3 mV
MDC IDC STAT BRADY AS VS PERCENT: 1.78 %
MDC IDC STAT BRADY RA PERCENT PACED: 16.82 %

## 2015-10-31 NOTE — Progress Notes (Signed)
HPI [name] returns today for followup. She is a very pleasant 75 year old woman with a history of hypertension, chronic systolic heart failure, and left bundle branch block, status post biventricular ICD implantation. She denies any recent ICD shocks. Her heart failure symptoms are improved but she still admits to sodium indiscretion. She denies chest pain, shortness of breath, or syncope. She has not had much in the way of peripheral edema.  No Known Allergies   Current Outpatient Prescriptions  Medication Sig Dispense Refill  . acetaminophen (TYLENOL) 500 MG tablet Take 1,000 mg by mouth every 6 (six) hours as needed.     [name] aspirin EC 81 MG tablet Take 81 mg by mouth daily.    . carvedilol (COREG) 6.25 MG tablet TAKE 1 TABLET BY MOUTH TWICE A DAY WITH MEALS 60 tablet 6  . folic acid (FOLVITE) 1 MG tablet Take 1 mg by mouth daily.    . furosemide (LASIX) 40 MG tablet TAKE 1 TABLET BY MOUTH TWICE A DAY (Patient taking differently: TAKE 2 TABLET BY MOUTH TWICE A DAY) 60 tablet 6  . hydroxychloroquine (PLAQUENIL) 200 MG tablet Take 100-200 mg by mouth 2 (two) times daily. Takes a whole tablet in the morning and half a tablet in the evening .    [name] KLOR-CON M10 10 MEQ tablet TAKE 1 TABLET BY MOUTH TWICE A DAY 60 tablet 6  . lisinopril (PRINIVIL,ZESTRIL) 10 MG tablet Take 10 mg by mouth daily.    [name] lovastatin (MEVACOR) 40 MG tablet TAKE 1 TABLET (40 MG TOTAL) BY MOUTH AT BEDTIME. 30 tablet 6  . omeprazole (PRILOSEC) 20 MG capsule Take 20 mg by mouth daily. For gas/heartburn    . PROAIR HFA 108 (90 BASE) MCG/ACT inhaler Inhale 2 puffs into the lungs every 4 (four) hours as needed for wheezing or shortness of breath. Reported on 08/26/2015     No current facility-administered medications for this visit.     Past Medical History  Diagnosis Date  . Arteriosclerotic cardiovascular disease (ASCVD)     Remote PTCA by patient report; LBBB; associated cardiomyopathy, presumed ischemic with EF 40-45%  previously, 20% in 06/2009; h/o clinical congestive heart failure; negative stress nuclear in 2009 with inferoseptal and apical scar  . Anemia     Hgb of 9-10  . Hyperlipidemia   . Breast cancer ([place])   . Hypertension   . Automatic implantable cardioverter-defibrillator in situ   . HOH (hard of hearing)   . Anemia of chronic disease     Hgb of 9-10 chronically; 06/2010: H&H-10.7/33.5, MCV-81, normal iron studies in 2010     ROS:   All systems reviewed and negative except as noted in the HPI.   Past Surgical History  Procedure Laterality Date  . Total knee arthroplasty Right     Dr.Harrison  . Bi-ventricular implantable cardioverter defibrillator  (crt-d)  07/28/2012  . Cataract extraction w/ intraocular lens implant Left   . Tubal ligation    . Breast biopsy Bilateral   . Mastectomy Left 1998  . Cataract extraction w/phaco Right 09/15/2013    Procedure: CATARACT EXTRACTION PHACO AND INTRAOCULAR LENS PLACEMENT (IOC);  Surgeon: Elta Guadeloupe T. Gershon Crane, MD;  Location: [facility] ORS;  Service: Ophthalmology;  Laterality: Right;  CDE:  10.74  . Bi-ventricular implantable cardioverter defibrillator N/A 07/28/2012    Procedure: BI-VENTRICULAR IMPLANTABLE CARDIOVERTER DEFIBRILLATOR  (CRT-D);  Surgeon: Evans Lance, MD;  Location: [facility] CATH LAB;  Service: Cardiovascular;  Laterality: N/A;  . Colonoscopy N/A 03/04/2015  Procedure: COLONOSCOPY;  Surgeon: Rogene Houston, MD;  Location: [facility] ENDO SU[facility];  Service: Endoscopy;  Laterality: N/A;  10:50   . Esophagogastroduodenoscopy N/A 03/04/2015    Procedure: ESOPHAGOGASTRODUODENOSCOPY (EGD);  Surgeon: Rogene Houston, MD;  Location: [facility] END[facility] SU[facility];  Service: Endoscopy;  Laterality: N/A;     Family History  Problem Relation Age of Onset  . Diabetes Father   . Pancreatic cancer Father   . Hypertension Brother      Social History   Social History  . Marital Status: Married    Spouse Name: N/A  . Number of Children: N/A  . Years of Education: N/A    Occupational History  . Retired    Social History Main Topics  . Smoking status: Former Smoker -- 0.25 packs/day    Types: Cigarettes    Start date: 09/17/1956    Quit date: 04/24/2007  . Smokeless tobacco: Current User    Types: Chew     Comment: 07/03/2013 "smoked some; don't know how much or how long or when I quit"  . Alcohol Use: No  . Drug Use: No  . Sexual Activity: Not Currently   Other Topics Concern  . Not on file   Social History Narrative   Married, lives with spouse   1 daughter, died in 76 in a car accident   Retired - worked in [employer] fund[employer]   No recent travel     BP 118/64 mmHg  Pulse 87  Ht 5\' 5"  (1.651 m)  Wt 148 lb (67.132 kg)  BMI 24.63 kg/m2  SpO2 93%  Physical Exam:  Stable appearing  75 year old woman,NAD HEENT: Unremarkable Neck:  8 cm JVD, no thyromegally Back:  No CVA tenderness Lungs:  Clear with no wheezes, rales, or rhonchi. HEART:  Regular rate rhythm, 2/6 systolic murmur c/w MR, no rubs, no clicks Abd:  soft, positive bowel sounds, no organomegally, no rebound, no guarding Ext:  2 plus pulses, trace edema left leg, no cyanosis, no clubbing Skin:  No rashes no nodules Neuro:  CN II through XII intact, motor grossly intact   DEVICE  Normal device function.  See PaceArt for details.   Assess/Plan: 1. Chronic systolic heart failure - her symptoms remain well controlled class 2. She is encouraged to maintain a low sodium diet. 2. HTN - her blood pressure is well controlled. No change in meds.  3. ICD - her medtronic BiV ICD is working normally. Will recheck in several months.  Mikle Bosworth.D.

## 2015-10-31 NOTE — Patient Instructions (Signed)
Your physician wants you to follow-up in: 1 year You will receive a reminder letter in the mail two months in advance. If you don't receive a letter, please call our office to schedule the follow-up appointment.   Your physician recommends that you continue on your current medications as directed. Please refer to the Current Medication list given to you today.    If you need a refill on your cardiac medications before your next appointment, please call your pharmacy.      Thank you for choosing [facility] !        

## 2015-11-02 ENCOUNTER — Other Ambulatory Visit (HOSPITAL_COMMUNITY)
Admission: RE | Admit: 2015-11-02 | Discharge: 2015-11-02 | Disposition: A | Discharge status: Home / Self Care | Payer: Medicare Other | Source: Ambulatory Visit | Attending: Rheumatology | Admitting: Rheumatology

## 2015-11-02 DIAGNOSIS — Z79899 Other long term (current) drug therapy: Secondary | ICD-10-CM | POA: Insufficient documentation

## 2015-11-02 LAB — CBC WITH DIFFERENTIAL/PLATELET
BASOS ABS: 0.1 10*3/uL (ref 0.0–0.1)
Basophils Relative: 1 %
EOS ABS: 0.3 10*3/uL (ref 0.0–0.7)
EOS PCT: 5 %
HCT: 25.2 % — ABNORMAL LOW (ref 36.0–46.0)
Hemoglobin: 7.9 g/dL — ABNORMAL LOW (ref 12.0–15.0)
Lymphocytes Relative: 25 %
Lymphs Abs: 1.6 10*3/uL (ref 0.7–4.0)
MCH: 25.5 pg — ABNORMAL LOW (ref 26.0–34.0)
MCHC: 31.3 g/dL (ref 30.0–36.0)
MCV: 81.3 fL (ref 78.0–100.0)
Monocytes Absolute: 0.7 10*3/uL (ref 0.1–1.0)
Monocytes Relative: 11 %
Neutro Abs: 3.6 10*3/uL (ref 1.7–7.7)
Neutrophils Relative %: 58 %
PLATELETS: 222 10*3/uL (ref 150–400)
RBC: 3.1 MIL/uL — AB (ref 3.87–5.11)
RDW: 18 % — ABNORMAL HIGH (ref 11.5–15.5)
WBC: 6.3 10*3/uL (ref 4.0–10.5)

## 2015-11-02 LAB — COMPREHENSIVE METABOLIC PANEL
ALT: 10 U/L — ABNORMAL LOW (ref 14–54)
ANION GAP: 6 (ref 5–15)
AST: 15 U/L (ref 15–41)
Albumin: 3.2 g/dL — ABNORMAL LOW (ref 3.5–5.0)
Alkaline Phosphatase: 91 U/L (ref 38–126)
BILIRUBIN TOTAL: 0.4 mg/dL (ref 0.3–1.2)
BUN: 41 mg/dL — ABNORMAL HIGH (ref 6–20)
CHLORIDE: 104 mmol/L (ref 101–111)
CO2: 27 mmol/L (ref 22–32)
CREATININE: 1.73 mg/dL — AB (ref 0.44–1.00)
Calcium: 8.9 mg/dL (ref 8.9–10.3)
GFR calc Af Amer: 32 mL/min — ABNORMAL LOW (ref >60)
GFR, EST NON AFRICAN AMERICAN: 27 mL/min — AB (ref >60)
Glucose, Bld: 103 mg/dL — ABNORMAL HIGH (ref 65–99)
POTASSIUM: 4.5 mmol/L (ref 3.5–5.1)
Sodium: 137 mmol/L (ref 135–145)
TOTAL PROTEIN: 7.6 g/dL (ref 6.5–8.1)

## 2015-11-29 ENCOUNTER — Emergency Department (HOSPITAL_COMMUNITY): Payer: Medicare Other

## 2015-11-29 ENCOUNTER — Emergency Department (HOSPITAL_COMMUNITY)
Admission: EM | Admit: 2015-11-29 | Discharge: 2015-11-29 | Disposition: A | Discharge status: Home / Self Care | Payer: Medicare Other | Attending: Emergency Medicine | Admitting: Emergency Medicine

## 2015-11-29 ENCOUNTER — Encounter (HOSPITAL_COMMUNITY): Payer: Self-pay | Admitting: Emergency Medicine

## 2015-11-29 DIAGNOSIS — Z7982 Long term (current) use of aspirin: Secondary | ICD-10-CM | POA: Insufficient documentation

## 2015-11-29 DIAGNOSIS — I509 Heart failure, unspecified: Secondary | ICD-10-CM | POA: Diagnosis not present

## 2015-11-29 DIAGNOSIS — F17220 Nicotine dependence, chewing tobacco, uncomplicated: Secondary | ICD-10-CM | POA: Insufficient documentation

## 2015-11-29 DIAGNOSIS — I110 Hypertensive heart disease with heart failure: Secondary | ICD-10-CM | POA: Diagnosis not present

## 2015-11-29 DIAGNOSIS — Z853 Personal history of malignant neoplasm of breast: Secondary | ICD-10-CM | POA: Insufficient documentation

## 2015-11-29 DIAGNOSIS — Z79899 Other long term (current) drug therapy: Secondary | ICD-10-CM | POA: Diagnosis not present

## 2015-11-29 DIAGNOSIS — R05 Cough: Secondary | ICD-10-CM | POA: Diagnosis present

## 2015-11-29 DIAGNOSIS — J189 Pneumonia, unspecified organism: Secondary | ICD-10-CM

## 2015-11-29 LAB — CBC WITH DIFFERENTIAL/PLATELET
Basophils Absolute: 0 10*3/uL (ref 0.0–0.1)
Basophils Relative: 1 %
EOS ABS: 0.2 10*3/uL (ref 0.0–0.7)
Eosinophils Relative: 3 %
HCT: 25.2 % — ABNORMAL LOW (ref 36.0–46.0)
HEMOGLOBIN: 7.9 g/dL — AB (ref 12.0–15.0)
LYMPHS ABS: 1.5 10*3/uL (ref 0.7–4.0)
Lymphocytes Relative: 20 %
MCH: 25.6 pg — AB (ref 26.0–34.0)
MCHC: 31.3 g/dL (ref 30.0–36.0)
MCV: 81.6 fL (ref 78.0–100.0)
Monocytes Absolute: 1.1 10*3/uL — ABNORMAL HIGH (ref 0.1–1.0)
Monocytes Relative: 15 %
NEUTROS PCT: 61 %
Neutro Abs: 4.6 10*3/uL (ref 1.7–7.7)
Platelets: 232 10*3/uL (ref 150–400)
RBC: 3.09 MIL/uL — AB (ref 3.87–5.11)
RDW: 17.6 % — ABNORMAL HIGH (ref 11.5–15.5)
WBC: 7.4 10*3/uL (ref 4.0–10.5)

## 2015-11-29 LAB — BASIC METABOLIC PANEL
ANION GAP: 7 (ref 5–15)
BUN: 31 mg/dL — AB (ref 6–20)
CHLORIDE: 105 mmol/L (ref 101–111)
CO2: 25 mmol/L (ref 22–32)
Calcium: 8.3 mg/dL — ABNORMAL LOW (ref 8.9–10.3)
Creatinine, Ser: 1.68 mg/dL — ABNORMAL HIGH (ref 0.44–1.00)
GFR calc non Af Amer: 28 mL/min — ABNORMAL LOW (ref >60)
GFR, EST AFRICAN AMERICAN: 33 mL/min — AB (ref >60)
Glucose, Bld: 119 mg/dL — ABNORMAL HIGH (ref 65–99)
POTASSIUM: 4.1 mmol/L (ref 3.5–5.1)
SODIUM: 137 mmol/L (ref 135–145)

## 2015-11-29 MED ORDER — LEVOFLOXACIN 750 MG PO TABS: 750.0000 mg | ORAL_TABLET | Freq: Every day | ORAL | 0 refills | Status: DC

## 2015-11-29 MED ORDER — BENZONATATE 100 MG PO CAPS
200.0000 mg | ORAL_CAPSULE | Freq: Once | ORAL | Status: AC
Start: 2015-11-29 — End: 2015-11-29
  Administered 2015-11-29: 200 mg via ORAL
  Filled 2015-11-29: qty 2

## 2015-11-29 MED ORDER — BENZONATATE 100 MG PO CAPS: 200.0000 mg | ORAL_CAPSULE | Freq: Three times a day (TID) | ORAL | 0 refills | Status: DC | PRN

## 2015-11-29 MED ORDER — LEVOFLOXACIN 750 MG PO TABS
750.0000 mg | ORAL_TABLET | Freq: Once | ORAL | Status: AC
  Administered 2015-11-29: 750 mg via ORAL
  Filled 2015-11-29: qty 1

## 2015-11-29 NOTE — ED Provider Notes (Signed)
[facility] DEPT Provider Note   CSN: [id number] Arrival date & time: 11/29/15  1537  First Provider Contact:  First MD Initiated Contact with Patient 11/29/15 1643        History   Chief Complaint Chief Complaint  Patient presents with  . Cough    HPI [name] is a 75 y.o. female.  [name] is a 75 y.o. Female, former smoker with a past medical history significant for ASCVD, CHF with EF of 20% in 2011,cardiomyopathy and history of bronchitis presenting with a 5 day history of cough which has been productive of clear sputum along with chest pain which is fleeting and associated with cough episodes.  She does report subjective fever, denies shortness of breath, palpitations, nausea, vomiting or abdominal pain and has no ankle edema.       The history is provided by the patient and the spouse.    Past Medical History:  Diagnosis Date  . Anemia    Hgb of 9-10  . Anemia of chronic disease    Hgb of 9-10 chronically; 06/2010: H&H-10.7/33.5, MCV-81, normal iron studies in 2010   . Arteriosclerotic cardiovascular disease (ASCVD)    Remote PTCA by patient report; LBBB; associated cardiomyopathy, presumed ischemic with EF 40-45% previously, 20% in 06/2009; h/o clinical congestive heart failure; negative stress nuclear in 2009 with inferoseptal and apical scar  . Automatic implantable cardioverter-defibrillator in situ   . Breast cancer ([place])   . HOH (hard of hearing)   . Hyperlipidemia   . Hypertension     Patient Active Problem List   Diagnosis Date Noted  . CAD S/P remote PCI- no details 09/02/2014  . Cardiomyopathy, ischemic-EF 30-35% March 2015 09/02/2014  . Diastolic dysfunction-grade 2 09/02/2014  . CHF exacerbation ([place]) 08/28/2014  . Acute on chronic combined systolic and diastolic congestive heart failure ([place]) 08/28/2014  . Iron deficiency anemia 08/20/2013  . Malnutrition of moderate degree ([place]) 07/21/2013  . PNA (pneumonia) 07/19/2013  . HCAP  (healthcare-associated pneumonia) 07/17/2013  . Sepsis ([place]) 07/17/2013  . ARF (acute renal failure) ([place]) 07/17/2013  . Rheumatoid arthritis ([place]) 07/04/2013  . Hypokalemia 07/03/2013  . Pain and swelling of wrist 07/03/2013  . Chest pain 07/03/2013  . Biventricular ICD in place (MDT 2014) 11/06/2012  . Anemia of chronic disease   . Breast cancer ([place])   . Hypertension   . Dyslipidemia 05/24/2009  . Chronic systolic heart failure ([place]) 05/24/2009    Past Surgical History:  Procedure Laterality Date  . BI-VENTRICULAR IMPLANTABLE CARDIOVERTER DEFIBRILLATOR N/A 07/28/2012   Procedure: BI-VENTRICULAR IMPLANTABLE CARDIOVERTER DEFIBRILLATOR  (CRT-D);  Surgeon: Evans Lance, MD;  Location: [facility] CATH LAB;  Service: Cardiovascular;  Laterality: N/A;  . BI-VENTRICULAR IMPLANTABLE CARDIOVERTER DEFIBRILLATOR  (CRT-D)  07/28/2012  . BREAST BIOPSY Bilateral   . CATARACT EXTRACTION W/ INTRAOCULAR LENS IMPLANT Left   . CATARACT EXTRACTION W/PHACO Right 09/15/2013   Procedure: CATARACT EXTRACTION PHACO AND INTRAOCULAR LENS PLACEMENT (IOC);  Surgeon: Elta Guadeloupe T. Gershon Crane, MD;  Location: [facility] ORS;  Service: Ophthalmology;  Laterality: Right;  CDE:  10.74  . COLONOSCOPY N/A 03/04/2015   Procedure: COLONOSCOPY;  Surgeon: Rogene Houston, MD;  Location: [facility] ENDO SUITE;  Service: Endoscopy;  Laterality: N/A;  10:50   . ESOPHAGOGASTRODUODENOSCOPY N/A 03/04/2015   Procedure: ESOPHAGOGASTRODUODENOSCOPY (EGD);  Surgeon: Rogene Houston, MD;  Location: [facility] ENDO SUITE;  Service: Endoscopy;  Laterality: N/A;  . MASTECTOMY Left 1998  . TOTAL KNEE ARTHROPLASTY Right    Dr.Harrison  .  TUBAL LIGATION      OB History    Gravida Para Term Preterm AB Living   1 1 1      0   SAB TAB Ectopic Multiple Live Births                   Home Medications    Prior to Admission medications   Medication Sig Start Date End Date Taking? Authorizing Provider  acetaminophen (TYLENOL) 500 MG tablet Take 1,000 mg by mouth every 6 (six)  hours as needed.     Historical Provider, MD  aspirin EC 81 MG tablet Take 81 mg by mouth daily.    Historical Provider, MD  benzonatate (TESSALON) 100 MG capsule Take 2 capsules (200 mg total) by mouth 3 (three) times daily as needed. 11/29/15   Evalee Jefferson, PA-C  carvedilol (COREG) 6.25 MG tablet TAKE 1 TABLET BY MOUTH TWICE A DAY WITH MEALS 06/07/15   Lendon Colonel, NP  folic acid (FOLVITE) 1 MG tablet Take 1 mg by mouth daily.    Historical Provider, MD  furosemide (LASIX) 40 MG tablet TAKE 1 TABLET BY MOUTH TWICE A DAY Patient taking differently: TAKE 2 TABLET BY MOUTH TWICE A DAY 05/30/15   Lendon Colonel, NP  hydroxychloroquine (PLAQUENIL) 200 MG tablet Take 100-200 mg by mouth 2 (two) times daily. Takes a whole tablet in the morning and half a tablet in the evening . 10/25/13   Historical Provider, MD  KLOR-CON M10 10 MEQ tablet TAKE 1 TABLET BY MOUTH TWICE A DAY 08/29/15   Lendon Colonel, NP  levofloxacin (LEVAQUIN) 750 MG tablet Take 1 tablet (750 mg total) by mouth daily. 11/29/15   Evalee Jefferson, PA-C  lisinopril (PRINIVIL,ZESTRIL) 10 MG tablet Take 10 mg by mouth daily.    Historical Provider, MD  lovastatin (MEVACOR) 40 MG tablet TAKE 1 TABLET (40 MG TOTAL) BY MOUTH AT BEDTIME. 08/19/15   Lendon Colonel, NP  omeprazole (PRILOSEC) 20 MG capsule Take 20 mg by mouth daily. For gas/heartburn    Historical Provider, MD  PROAIR HFA 108 (90 BASE) MCG/ACT inhaler Inhale 2 puffs into the lungs every 4 (four) hours as needed for wheezing or shortness of breath. Reported on 08/26/2015 07/23/14   Historical Provider, MD    Family History Family History  Problem Relation Age of Onset  . Diabetes Father   . Pancreatic cancer Father   . Hypertension Brother     Social History Social History  Substance Use Topics  . Smoking status: Former Smoker    Packs/day: 0.25    Types: Cigarettes    Start date: 09/17/1956    Quit date: 04/24/2007  . Smokeless tobacco: Current User    Types: Chew      Comment: 07/03/2013 "smoked some; don't know how much or how long or when I quit"  . Alcohol use No     Allergies   Review of patient's allergies indicates no known allergies.   Review of Systems Review of Systems  Constitutional: Positive for fever.  HENT: Negative for congestion and sore throat.   Eyes: Negative.   Respiratory: Positive for cough. Negative for chest tightness, shortness of breath, wheezing and stridor.   Cardiovascular: Positive for chest pain. Negative for palpitations and leg swelling.  Gastrointestinal: Negative for abdominal pain, nausea and vomiting.  Genitourinary: Negative.   Musculoskeletal: Negative for arthralgias, joint swelling and neck pain.  Skin: Negative.  Negative for rash and wound.  Neurological: Negative for  dizziness, weakness, light-headedness, numbness and headaches.  Psychiatric/Behavioral: Negative.      Physical Exam Updated Vital Signs BP 124/57 (BP Location: Right Arm)   Pulse 65   Temp 99.4 F (37.4 C) (Oral)   Resp 24   Ht 5\' 4"  (1.626 m)   Wt 65.8 kg   SpO2 99%   BMI 24.89 kg/m   Physical Exam  Constitutional: She appears well-developed and well-nourished.  HENT:  Head: Normocephalic and atraumatic.  Eyes: Conjunctivae are normal.  Neck: Normal range of motion.  Cardiovascular: Normal rate, regular rhythm, normal heart sounds and intact distal pulses.   Pulmonary/Chest: Effort normal. She has decreased breath sounds in the right lower field and the left lower field. She has no wheezes. She has no rhonchi. She exhibits no tenderness.  Abdominal: Soft. Bowel sounds are normal. There is no tenderness.  Musculoskeletal: Normal range of motion.  Neurological: She is alert.  Skin: Skin is warm and dry.  Psychiatric: She has a normal mood and affect.  Nursing note and vitals reviewed.    ED Treatments / Results  Labs (all labs ordered are listed, but only abnormal results are displayed) Labs Reviewed  CBC WITH  DIFFERENTIAL/PLATELET - Abnormal; Notable for the following:       Result Value   RBC 3.09 (*)    Hemoglobin 7.9 (*)    HCT 25.2 (*)    MCH 25.6 (*)    RDW 17.6 (*)    Monocytes Absolute 1.1 (*)    All other components within normal limits  BASIC METABOLIC PANEL - Abnormal; Notable for the following:    Glucose, Bld 119 (*)    BUN 31 (*)    Creatinine, Ser 1.68 (*)    Calcium 8.3 (*)    GFR calc non Af Amer 28 (*)    GFR calc Af Amer 33 (*)    All other components within normal limits    EKG  EKG Interpretation None       Radiology Dg Chest 2 View  Result Date: 11/29/2015 CLINICAL DATA:  Productive cough but it is clear. Cough x 5 days. Chest pain when she coughs. EXAM: CHEST  2 VIEW COMPARISON:  Chest CT, 08/31/2015.  Chest radiographs, 06/13/2015. FINDINGS: Cardiac silhouette is mildly enlarged. Normal mediastinal and hilar contours. Cardiac pacemaker is stable. There is lung base opacity best seen on the lateral view, more prominently in the left lower lobe that in the right lower lobe. This may all be due to atelectasis and chronic interstitial thickening. A component of bronchitis or even pneumonia should be considered, however, given history. Interstitial prominence is seen diffusely predominating in the lower lungs. This is stable. Lungs are hyperexpanded. No pneumothorax. Skeletal structures are demineralized but grossly intact. IMPRESSION: 1. Bibasilar lung opacity, greater in the left lower lobe. This may reflect combination of atelectasis and chronic interstitial thickening. A component of pneumonia and/or bronchitis should be considered. 2. No other evidence of acute cardiopulmonary disease. 3. Stable mild cardiomegaly. Stable changes of COPD with interstitial thickening. Electronically Signed   By: Lajean Manes M.D.   On: 11/29/2015 16:20    Procedures Procedures (including critical care time)  Medications Ordered in ED Medications  levofloxacin (LEVAQUIN) tablet 750  mg (not administered)  benzonatate (TESSALON) capsule 200 mg (not administered)     Initial Impression / Assessment and Plan / ED Course  I have reviewed the triage vital signs and the nursing notes.  Pertinent labs & imaging results that  were available during my care of the patient were reviewed by me and considered in my medical decision making (see chart for details).  Clinical Course    Chronic anemia, last hgb early July also 7.9.  Renal insufficiency chronic and stable today as well. Pt is motivated to go home with abx, does not wish to be admitted.  Discussed with Dr Laverta Baltimore who also saw patient.  Will start on levaquin, first dose given here.  Ok for home trial as pt in no distress. Will f/u with pcp tomorrow for a recheck but advised return here for any worsened sx including fever, weakness, sob or any new sx.  Pt and husband understand and agree with plan.  Final Clinical Impressions(s) / ED Diagnoses   Final diagnoses:  CAP (community acquired pneumonia)    New Prescriptions New Prescriptions   BENZONATATE (TESSALON) 100 MG CAPSULE    Take 2 capsules (200 mg total) by mouth 3 (three) times daily as needed.   LEVOFLOXACIN (LEVAQUIN) 750 MG TABLET    Take 1 tablet (750 mg total) by mouth daily.     Evalee Jefferson, PA-C 11/29/15 1729    Evalee Jefferson, PA-C 11/29/15 1731    Margette Fast, MD 11/29/15 [phone]

## 2015-11-29 NOTE — Discharge Instructions (Signed)
Take your next dose of the antibiotic tomorrow evening as you have received todays dose here.

## 2015-11-29 NOTE — ED Triage Notes (Signed)
Patient complaining of cough x 5 days. States she has pain in her chest with cough only. States she has history of bronchitis. States she is coughing up clear sputum.

## 2015-12-01 ENCOUNTER — Telehealth: Payer: Self-pay

## 2015-12-01 ENCOUNTER — Ambulatory Visit (INDEPENDENT_AMBULATORY_CARE_PROVIDER_SITE_OTHER): Payer: Medicare Other

## 2015-12-01 DIAGNOSIS — Z95810 Presence of automatic (implantable) cardiac defibrillator: Secondary | ICD-10-CM | POA: Diagnosis not present

## 2015-12-01 DIAGNOSIS — I5022 Chronic systolic (congestive) heart failure: Secondary | ICD-10-CM

## 2015-12-01 NOTE — Progress Notes (Signed)
EPIC Encounter for ICM Monitoring  Patient Name: [name] is a 75 y.o. female Date: 12/01/2015 Primary Care Physican: [name], MD Primary Cardiologist: Lorrine Kin Electrophysiologist: Lovena Le Dry Weight: unknown Bi-V Pacing:  90.9%       Attempted call to nephew, [name] and unable to reach.  Transmission reviewed.   Thoracic impedance abnormal suggesting fluid accumulation 11/10/2015 to 12/01/2015 and returned to baseline 12/01/2015.  Patient had ED visit on 8/82017 and dx with pneumonia.  ICM trend: 12/01/2015     Follow-up plan: ICM clinic phone appointment on 01/04/2016.  Copy of ICM check sent to device physician.   [name], RN 12/01/2015 1:49 PM

## 2015-12-01 NOTE — Telephone Encounter (Signed)
Remote ICM transmission received.  Attempted call to nephew [name] and left message for return call.

## 2015-12-02 ENCOUNTER — Encounter (HOSPITAL_COMMUNITY): Payer: Self-pay | Admitting: Emergency Medicine

## 2015-12-02 ENCOUNTER — Emergency Department (HOSPITAL_COMMUNITY)
Admission: EM | Admit: 2015-12-02 | Discharge: 2015-12-02 | Disposition: A | Discharge status: Home / Self Care | Payer: Medicare Other | Attending: Emergency Medicine | Admitting: Emergency Medicine

## 2015-12-02 ENCOUNTER — Emergency Department (HOSPITAL_COMMUNITY): Payer: Medicare Other

## 2015-12-02 DIAGNOSIS — I2510 Atherosclerotic heart disease of native coronary artery without angina pectoris: Secondary | ICD-10-CM | POA: Insufficient documentation

## 2015-12-02 DIAGNOSIS — Z853 Personal history of malignant neoplasm of breast: Secondary | ICD-10-CM | POA: Diagnosis not present

## 2015-12-02 DIAGNOSIS — I110 Hypertensive heart disease with heart failure: Secondary | ICD-10-CM | POA: Diagnosis not present

## 2015-12-02 DIAGNOSIS — R300 Dysuria: Secondary | ICD-10-CM

## 2015-12-02 DIAGNOSIS — Z87891 Personal history of nicotine dependence: Secondary | ICD-10-CM | POA: Insufficient documentation

## 2015-12-02 DIAGNOSIS — M545 Low back pain, unspecified: Secondary | ICD-10-CM

## 2015-12-02 DIAGNOSIS — Z79899 Other long term (current) drug therapy: Secondary | ICD-10-CM | POA: Insufficient documentation

## 2015-12-02 DIAGNOSIS — R112 Nausea with vomiting, unspecified: Secondary | ICD-10-CM | POA: Diagnosis not present

## 2015-12-02 DIAGNOSIS — R05 Cough: Secondary | ICD-10-CM | POA: Insufficient documentation

## 2015-12-02 DIAGNOSIS — M549 Dorsalgia, unspecified: Secondary | ICD-10-CM | POA: Diagnosis present

## 2015-12-02 DIAGNOSIS — I5022 Chronic systolic (congestive) heart failure: Secondary | ICD-10-CM | POA: Diagnosis not present

## 2015-12-02 DIAGNOSIS — R110 Nausea: Secondary | ICD-10-CM

## 2015-12-02 LAB — BASIC METABOLIC PANEL
Anion gap: 6 (ref 5–15)
BUN: 28 mg/dL — AB (ref 6–20)
CHLORIDE: 106 mmol/L (ref 101–111)
CO2: 24 mmol/L (ref 22–32)
CREATININE: 1.38 mg/dL — AB (ref 0.44–1.00)
Calcium: 8.5 mg/dL — ABNORMAL LOW (ref 8.9–10.3)
GFR calc Af Amer: 42 mL/min — ABNORMAL LOW (ref >60)
GFR calc non Af Amer: 36 mL/min — ABNORMAL LOW (ref >60)
GLUCOSE: 90 mg/dL (ref 65–99)
POTASSIUM: 4 mmol/L (ref 3.5–5.1)
Sodium: 136 mmol/L (ref 135–145)

## 2015-12-02 LAB — URINALYSIS, ROUTINE W REFLEX MICROSCOPIC
Bilirubin Urine: NEGATIVE
GLUCOSE, UA: NEGATIVE mg/dL
Hgb urine dipstick: NEGATIVE
KETONES UR: NEGATIVE mg/dL
LEUKOCYTES UA: NEGATIVE
NITRITE: NEGATIVE
PROTEIN: NEGATIVE mg/dL
Specific Gravity, Urine: 1.02 (ref 1.005–1.030)
pH: 6 (ref 5.0–8.0)

## 2015-12-02 LAB — CBC WITH DIFFERENTIAL/PLATELET
Basophils Absolute: 0 10*3/uL (ref 0.0–0.1)
Basophils Relative: 0 %
EOS ABS: 0.1 10*3/uL (ref 0.0–0.7)
EOS PCT: 1 %
HCT: 25.9 % — ABNORMAL LOW (ref 36.0–46.0)
Hemoglobin: 8.1 g/dL — ABNORMAL LOW (ref 12.0–15.0)
LYMPHS ABS: 1.5 10*3/uL (ref 0.7–4.0)
LYMPHS PCT: 26 %
MCH: 25.2 pg — AB (ref 26.0–34.0)
MCHC: 31.3 g/dL (ref 30.0–36.0)
MCV: 80.4 fL (ref 78.0–100.0)
MONO ABS: 0.8 10*3/uL (ref 0.1–1.0)
Monocytes Relative: 13 %
Neutro Abs: 3.5 10*3/uL (ref 1.7–7.7)
Neutrophils Relative %: 60 %
PLATELETS: 245 10*3/uL (ref 150–400)
RBC: 3.22 MIL/uL — ABNORMAL LOW (ref 3.87–5.11)
RDW: 17.2 % — AB (ref 11.5–15.5)
WBC: 5.9 10*3/uL (ref 4.0–10.5)

## 2015-12-02 MED ORDER — ONDANSETRON 4 MG PO TBDP: 4.0000 mg | ORAL_TABLET | Freq: Three times a day (TID) | ORAL | 1 refills | Status: DC | PRN

## 2015-12-02 MED ORDER — ONDANSETRON 4 MG PO TBDP
4.0000 mg | ORAL_TABLET | Freq: Once | ORAL | Status: AC
  Administered 2015-12-02: 4 mg via ORAL
  Filled 2015-12-02: qty 1

## 2015-12-02 NOTE — Discharge Instructions (Signed)
No evidence of urinary tract infection. X-rays of your back showed no acute injury to the back area. Take the Zofran as needed for the nausea. Continue taking your antibiotics. Make an appointment to follow-up with your regular doctor.

## 2015-12-02 NOTE — ED Notes (Signed)
Patient transported to X-ray 

## 2015-12-02 NOTE — ED Triage Notes (Signed)
Pt reports lower back pain since last Friday. Pt reports frequency with urination, no burning with urination, denies blood. Pt has vomited 2 times last night and once today.

## 2015-12-02 NOTE — ED Provider Notes (Signed)
[facility] DEPT Provider Note   CSN: [id number] Arrival date & time: 12/02/15  1450  First Provider Contact:  First MD Initiated Contact with Patient 12/02/15 1751        History   Chief Complaint Chief Complaint  Patient presents with  . Back Pain    HPI [name] is a 75 y.o. female.  Patient evaluated in the emergency department on August 7 diagnosis can be acquired pneumonia. Started on Levaquin.  Patient states that she's feeling better overall from the pneumonia symptoms. But has still been doing some coughing. Patient now a complaint of back pain urinary frequency and some discomfort with urinating.   Patient also started on hydrocodone for the back pain by her primary care doctor. She had vomiting last night and vomiting today. May be related to that medication. No history of any fall.      Past Medical History:  Diagnosis Date  . Anemia    Hgb of 9-10  . Anemia of chronic disease    Hgb of 9-10 chronically; 06/2010: H&H-10.7/33.5, MCV-81, normal iron studies in 2010   . Arteriosclerotic cardiovascular disease (ASCVD)    Remote PTCA by patient report; LBBB; associated cardiomyopathy, presumed ischemic with EF 40-45% previously, 20% in 06/2009; h/o clinical congestive heart failure; negative stress nuclear in 2009 with inferoseptal and apical scar  . Automatic implantable cardioverter-defibrillator in situ   . Breast cancer ([place])   . HOH (hard of hearing)   . Hyperlipidemia   . Hypertension     Patient Active Problem List   Diagnosis Date Noted  . CAD S/P remote PCI- no details 09/02/2014  . Cardiomyopathy, ischemic-EF 30-35% March 2015 09/02/2014  . Diastolic dysfunction-grade 2 09/02/2014  . CHF exacerbation ([place]) 08/28/2014  . Acute on chronic combined systolic and diastolic congestive heart failure ([place]) 08/28/2014  . Iron deficiency anemia 08/20/2013  . Malnutrition of moderate degree ([place]) 07/21/2013  . PNA (pneumonia) 07/19/2013  . HCAP  (healthcare-associated pneumonia) 07/17/2013  . Sepsis ([place]) 07/17/2013  . ARF (acute renal failure) ([place]) 07/17/2013  . Rheumatoid arthritis ([place]) 07/04/2013  . Hypokalemia 07/03/2013  . Pain and swelling of wrist 07/03/2013  . Chest pain 07/03/2013  . Biventricular ICD in place (MDT 2014) 11/06/2012  . Anemia of chronic disease   . Breast cancer ([place])   . Hypertension   . Dyslipidemia 05/24/2009  . Chronic systolic heart failure ([place]) 05/24/2009    Past Surgical History:  Procedure Laterality Date  . BI-VENTRICULAR IMPLANTABLE CARDIOVERTER DEFIBRILLATOR N/A 07/28/2012   Procedure: BI-VENTRICULAR IMPLANTABLE CARDIOVERTER DEFIBRILLATOR  (CRT-D);  Surgeon: Evans Lance, MD;  [facility] C[facility] LAB;  Service: Cardiovascular;  Laterality: N/A;  . BI-VENTRICULAR IMPLANTABLE CARDIOVERTER DEFIBRILLATOR  (CRT-D)  07/28/2012  . BREAST BIOPSY Bilateral   . CATARACT EXTRACTION W/ INTRAOCULAR LENS IMPLANT Left   . CATARACT EXTRACTION W/PHACO Right 09/15/2013   Procedure: CATARACT EXTRACTION PHACO AND INTRAOCULAR LENS PLACEMENT (IOC);  Surgeon: Elta Guadeloupe T. Gershon Crane, MD;  Location: [facility] ORS;  Service: Ophthalmology;  Laterality: Right;  CDE:  10.74  . COLONOSCOPY N/A 03/04/2015   Procedure: COLONOSCOPY;  Surgeon: Rogene Houston, MD;  Location: [facility] ENDO SUITE;  Service: Endoscopy;  Laterality: N/A;  10:50   . ESOPHAGOGASTRODUODENOSCOPY N/A 03/04/2015   Procedure: ESOPHAGOGASTRODUODENOSCOPY (EGD);  Surgeon: Rogene Houston, MD;  Location: [facility] ENDO SUITE;  Service: Endoscopy;  Laterality: N/A;  . MASTECTOMY Left 1998  . TOTAL KNEE ARTHROPLASTY Right    Dr.Harrison  . TUBAL LIGATION  OB History    Gravida Para Term Preterm AB Living   1 1 1      0   SAB TAB Ectopic Multiple Live Births                   Home Medications    Prior to Admission medications   Medication Sig Start Date End Date Taking? Authorizing Provider  acetaminophen (TYLENOL) 500 MG tablet Take 1,000 mg by mouth every 6 (six)  hours as needed.     Historical Provider, MD  aspirin EC 81 MG tablet Take 81 mg by mouth daily.    Historical Provider, MD  benzonatate (TESSALON) 100 MG capsule Take 2 capsules (200 mg total) by mouth 3 (three) times daily as needed. 11/29/15   Evalee Jefferson, PA-C  carvedilol (COREG) 6.25 MG tablet TAKE 1 TABLET BY MOUTH TWICE A DAY WITH MEALS 06/07/15   Lendon Colonel, NP  folic acid (FOLVITE) 1 MG tablet Take 1 mg by mouth daily.    Historical Provider, MD  furosemide (LASIX) 40 MG tablet TAKE 1 TABLET BY MOUTH TWICE A DAY Patient taking differently: TAKE 2 TABLET BY MOUTH TWICE A DAY 05/30/15   Lendon Colonel, NP  hydroxychloroquine (PLAQUENIL) 200 MG tablet Take 100-200 mg by mouth 2 (two) times daily. Takes a whole tablet in the morning and half a tablet in the evening . 10/25/13   Historical Provider, MD  KLOR-CON M10 10 MEQ tablet TAKE 1 TABLET BY MOUTH TWICE A DAY 08/29/15   Lendon Colonel, NP  levofloxacin (LEVAQUIN) 750 MG tablet Take 1 tablet (750 mg total) by mouth daily. 11/29/15   Evalee Jefferson, PA-C  lisinopril (PRINIVIL,ZESTRIL) 10 MG tablet Take 10 mg by mouth daily.    Historical Provider, MD  lovastatin (MEVACOR) 40 MG tablet TAKE 1 TABLET (40 MG TOTAL) BY MOUTH AT BEDTIME. 08/19/15   Lendon Colonel, NP  omeprazole (PRILOSEC) 20 MG capsule Take 20 mg by mouth daily. For gas/heartburn    Historical Provider, MD  ondansetron (ZOFRAN ODT) 4 MG disintegrating tablet Take 1 tablet (4 mg total) by mouth every 8 (eight) hours as needed. 12/02/15   Fredia Sorrow, MD  PROAIR HFA 108 (90 BASE) MCG/ACT inhaler Inhale 2 puffs into the lungs every 4 (four) hours as needed for wheezing or shortness of breath. Reported on 08/26/2015 07/23/14   Historical Provider, MD    Family History Family History  Problem Relation Age of Onset  . Diabetes Father   . Pancreatic cancer Father   . Hypertension Brother     Social History Social History  Substance Use Topics  . Smoking status: Former  Smoker    Packs/day: 0.25    Types: Cigarettes    Start date: 09/17/1956    Quit date: 04/24/2007  . Smokeless tobacco: Current User    Types: Chew     Comment: 07/03/2013 "smoked some; don't know how much or how long or when I quit"  . Alcohol use No     Allergies   Review of patient's allergies indicates no known allergies.   Review of Systems Review of Systems  Constitutional: Negative for fever.  HENT: Negative for congestion.   Respiratory: Positive for cough. Negative for shortness of breath.   Cardiovascular: Negative for chest pain.  Gastrointestinal: Positive for nausea and vomiting. Negative for abdominal pain and diarrhea.  Genitourinary: Positive for dysuria and frequency. Negative for hematuria.  Musculoskeletal: Positive for back pain.  Skin: Negative for  rash.  Neurological: Negative for headaches.  Hematological: Does not bruise/bleed easily.  Psychiatric/Behavioral: Negative for confusion.     Physical Exam Updated Vital Signs BP 129/56   Pulse (!) 57   Temp 98 F (36.7 C) (Oral)   Resp 16   Ht 5\' 4"  (1.626 m)   Wt 65.8 kg   SpO2 95%   BMI 24.89 kg/m   Physical Exam  Constitutional: She is oriented to person, place, and time. She appears well-developed and well-nourished. No distress.  HENT:  Head: Normocephalic and atraumatic.  Mouth/Throat: Oropharynx is clear and moist.  Eyes: EOM are normal. Pupils are equal, round, and reactive to light.  Neck: Normal range of motion. Neck supple.  Cardiovascular: Normal rate, regular rhythm and normal heart sounds.   No murmur heard. Pulmonary/Chest: Breath sounds normal. No respiratory distress. She has no wheezes. She has no rales.  Abdominal: Soft. Bowel sounds are normal. There is no tenderness.  Musculoskeletal: Normal range of motion. She exhibits no edema.  Neurological: She is alert and oriented to person, place, and time. No cranial nerve deficit. She exhibits normal muscle tone. Coordination  normal.  Skin: Skin is warm. No rash noted.  Nursing note and vitals reviewed.    ED Treatments / Results  Labs (all labs ordered are listed, but only abnormal results are displayed) Labs Reviewed  BASIC METABOLIC PANEL - Abnormal; Notable for the following:       Result Value   BUN 28 (*)    Creatinine, Ser 1.38 (*)    Calcium 8.5 (*)    GFR calc non Af Amer 36 (*)    GFR calc Af Amer 42 (*)    All other components within normal limits  CBC WITH DIFFERENTIAL/PLATELET - Abnormal; Notable for the following:    RBC 3.22 (*)    Hemoglobin 8.1 (*)    HCT 25.9 (*)    MCH 25.2 (*)    RDW 17.2 (*)    All other components within normal limits  URINALYSIS, ROUTINE W REFLEX MICROSCOPIC (NOT AT [facility])   Results for orders placed or performed during the hospital encounter of 12/02/15  Urinalysis, Routine w reflex microscopic (not at [facility])  Result Value Ref Range   Color, Urine YELLOW YELLOW   APPearance CLEAR CLEAR   Specific Gravity, Urine 1.020 1.005 - 1.030   pH 6.0 5.0 - 8.0   Glucose, UA NEGATIVE NEGATIVE mg/dL   Hgb urine dipstick NEGATIVE NEGATIVE   Bilirubin Urine NEGATIVE NEGATIVE   Ketones, ur NEGATIVE NEGATIVE mg/dL   Protein, ur NEGATIVE NEGATIVE mg/dL   Nitrite NEGATIVE NEGATIVE   Leukocytes, UA NEGATIVE NEGATIVE  Basic metabolic panel  Result Value Ref Range   Sodium 136 135 - 145 mmol/L   Potassium 4.0 3.5 - 5.1 mmol/L   Chloride 106 101 - 111 mmol/L   CO2 24 22 - 32 mmol/L   Glucose, Bld 90 65 - 99 mg/dL   BUN 28 (H) 6 - 20 mg/dL   Creatinine, Ser 1.38 (H) 0.44 - 1.00 mg/dL   Calcium 8.5 (L) 8.9 - 10.3 mg/dL   GFR calc non Af Amer 36 (L) >60 mL/min   GFR calc Af Amer 42 (L) >60 mL/min   Anion gap 6 5 - 15  CBC with Differential/Platelet  Result Value Ref Range   WBC 5.9 4.0 - 10.5 K/uL   RBC 3.22 (L) 3.87 - 5.11 MIL/uL   Hemoglobin 8.1 (L) 12.0 - 15.0 g/dL   HCT  25.9 (L) 36.0 - 46.0 %   MCV 80.4 78.0 - 100.0 fL   MCH 25.2 (L) 26.0 - 34.0 pg   MCHC 31.3  30.0 - 36.0 g/dL   RDW 17.2 (H) 11.5 - 15.5 %   Platelets 245 150 - 400 K/uL   Neutrophils Relative % 60 %   Neutro Abs 3.5 1.7 - 7.7 K/uL   Lymphocytes Relative 26 %   Lymphs Abs 1.5 0.7 - 4.0 K/uL   Monocytes Relative 13 %   Monocytes Absolute 0.8 0.1 - 1.0 K/uL   Eosinophils Relative 1 %   Eosinophils Absolute 0.1 0.0 - 0.7 K/uL   Basophils Relative 0 %   Basophils Absolute 0.0 0.0 - 0.1 K/uL    EKG  EKG Interpretation None       Radiology Dg Lumbar Spine Complete  Result Date: 12/02/2015 CLINICAL DATA:  Low back pain.  No reported injury. EXAM: LUMBAR SPINE - COMPLETE 4+ VIEW COMPARISON:  None. FINDINGS: This report assumes 5 non rib-bearing lumbar vertebrae. Lumbar vertebral body heights are preserved, with no fracture. Moderate degenerative disc disease is seen in the lower thoracic spine. Mild spondylosis is seen in the lumbar spine, with mild loss of disc height at L2-3 and L4-5. Mild 5 mm anterolisthesis at L4-5. Moderate facet arthropathy is seen bilaterally in the lower lumbar spine. No aggressive appearing focal osseous lesions. ICD leads are seen overlying the lower heart. Aortic atherosclerosis. IMPRESSION: 1. Moderate lower thoracic spine and mild lumbar spine spondylosis as described . 2. Mild 5 mm anterolisthesis at L4-5. 3. Aortic atherosclerosis. Electronically Signed   By: Ilona Sorrel M.D.   On: 12/02/2015 20:14    Procedures Procedures (including critical care time)  Medications Ordered in ED Medications  ondansetron (ZOFRAN-ODT) disintegrating tablet 4 mg (not administered)     Initial Impression / Assessment and Plan / ED Course  I have reviewed the triage vital signs and the nursing notes.  Pertinent labs & imaging results that were available during my care of the patient were reviewed by me and considered in my medical decision making (see chart for details).  Clinical Course    The patient recently treated for pneumonia. The patient is on  Levaquin. Patient presents today with a complaint of lower back pain since Friday frequency with urination, no blood in the urine. Patient also vomited 2 times last night and once today. The patient's primary care Dr. started on hydrocodone for the back pain. Patient without any fall or injury.  Urinalysis here negative for urinary tract infection. No evidence of diabetes. No leukocytosis. Patient's lungs are clear bilaterally. Patient nontoxic no acute distress. X-rays of the low part of the back without any evidence of compression fracture or any acute findings.  Suspect the nausea and vomiting was related to the hydrocodone. Will start patient on Zofran. Patient seems to be improving significantly from the pneumonia. Patient will continue take her Biaxin until finished.  Final Clinical Impressions(s) / ED Diagnoses   Final diagnoses:  Bilateral low back pain without sciatica  Nausea  Dysuria    New Prescriptions New Prescriptions   ONDANSETRON (ZOFRAN ODT) 4 MG DISINTEGRATING TABLET    Take 1 tablet (4 mg total) by mouth every 8 (eight) hours as needed.     Fredia Sorrow, MD 12/02/15 2146

## 2015-12-07 NOTE — Progress Notes (Signed)
Nephew returned call.  He stated patient has pneumonia but is finally feeling better.  He stated she had strained back muscles from coughing and pain med was making her nauseated.  She is feeling fine now. Advised to call if she has any fluid symptoms and next transmission is 01/04/2016.

## 2015-12-13 ENCOUNTER — Other Ambulatory Visit (HOSPITAL_COMMUNITY): Payer: Self-pay | Admitting: Internal Medicine

## 2015-12-13 ENCOUNTER — Other Ambulatory Visit (HOSPITAL_COMMUNITY): Payer: Medicare Other

## 2015-12-13 ENCOUNTER — Ambulatory Visit (HOSPITAL_COMMUNITY)
Admission: RE | Admit: 2015-12-13 | Discharge: 2015-12-13 | Disposition: A | Discharge status: Home / Self Care | Payer: Medicare Other | Source: Ambulatory Visit | Attending: Internal Medicine | Admitting: Internal Medicine

## 2015-12-13 DIAGNOSIS — Z8701 Personal history of pneumonia (recurrent): Secondary | ICD-10-CM | POA: Diagnosis not present

## 2015-12-13 DIAGNOSIS — I700 Atherosclerosis of aorta: Secondary | ICD-10-CM | POA: Insufficient documentation

## 2015-12-13 DIAGNOSIS — Z09 Encounter for follow-up examination after completed treatment for conditions other than malignant neoplasm: Secondary | ICD-10-CM | POA: Diagnosis not present

## 2015-12-13 DIAGNOSIS — I517 Cardiomegaly: Secondary | ICD-10-CM | POA: Diagnosis not present

## 2015-12-13 DIAGNOSIS — J449 Chronic obstructive pulmonary disease, unspecified: Secondary | ICD-10-CM | POA: Diagnosis not present

## 2015-12-13 DIAGNOSIS — J189 Pneumonia, unspecified organism: Secondary | ICD-10-CM | POA: Diagnosis present

## 2015-12-13 DIAGNOSIS — J690 Pneumonitis due to inhalation of food and vomit: Secondary | ICD-10-CM

## 2015-12-20 ENCOUNTER — Encounter (HOSPITAL_COMMUNITY): Payer: Self-pay | Admitting: Oncology

## 2015-12-20 ENCOUNTER — Encounter (HOSPITAL_COMMUNITY): Payer: Medicare Other | Attending: Hematology & Oncology | Admitting: Oncology

## 2015-12-20 ENCOUNTER — Encounter (HOSPITAL_COMMUNITY): Payer: Medicare Other

## 2015-12-20 ENCOUNTER — Other Ambulatory Visit (HOSPITAL_COMMUNITY)
Admission: RE | Admit: 2015-12-20 | Discharge: 2015-12-20 | Disposition: A | Discharge status: Home / Self Care | Payer: Medicare Other | Source: Ambulatory Visit | Attending: Rheumatology | Admitting: Rheumatology

## 2015-12-20 ENCOUNTER — Other Ambulatory Visit (HOSPITAL_COMMUNITY): Payer: Medicare Other

## 2015-12-20 VITALS — BP 109/48 | HR 79 | Resp 18 | Wt 147.5 lb

## 2015-12-20 DIAGNOSIS — R350 Frequency of micturition: Secondary | ICD-10-CM | POA: Diagnosis not present

## 2015-12-20 DIAGNOSIS — D638 Anemia in other chronic diseases classified elsewhere: Secondary | ICD-10-CM | POA: Diagnosis not present

## 2015-12-20 DIAGNOSIS — M069 Rheumatoid arthritis, unspecified: Secondary | ICD-10-CM | POA: Diagnosis not present

## 2015-12-20 DIAGNOSIS — D509 Iron deficiency anemia, unspecified: Secondary | ICD-10-CM

## 2015-12-20 DIAGNOSIS — D649 Anemia, unspecified: Secondary | ICD-10-CM | POA: Insufficient documentation

## 2015-12-20 DIAGNOSIS — R5381 Other malaise: Secondary | ICD-10-CM | POA: Diagnosis present

## 2015-12-20 LAB — CBC WITH DIFFERENTIAL/PLATELET
Basophils Absolute: 0.1 10*3/uL (ref 0.0–0.1)
Basophils Relative: 1 %
EOS ABS: 0.2 10*3/uL (ref 0.0–0.7)
EOS PCT: 3 %
HCT: 25.5 % — ABNORMAL LOW (ref 36.0–46.0)
Hemoglobin: 7.8 g/dL — ABNORMAL LOW (ref 12.0–15.0)
LYMPHS ABS: 1.4 10*3/uL (ref 0.7–4.0)
LYMPHS PCT: 23 %
MCH: 24.5 pg — AB (ref 26.0–34.0)
MCHC: 30.6 g/dL (ref 30.0–36.0)
MCV: 79.9 fL (ref 78.0–100.0)
MONOS PCT: 12 %
Monocytes Absolute: 0.7 10*3/uL (ref 0.1–1.0)
Neutro Abs: 3.7 10*3/uL (ref 1.7–7.7)
Neutrophils Relative %: 61 %
Platelets: 303 10*3/uL (ref 150–400)
RBC: 3.19 MIL/uL — AB (ref 3.87–5.11)
RDW: 16.7 % — ABNORMAL HIGH (ref 11.5–15.5)
WBC: 6.1 10*3/uL (ref 4.0–10.5)

## 2015-12-20 LAB — COMPREHENSIVE METABOLIC PANEL
ALBUMIN: 3.2 g/dL — AB (ref 3.5–5.0)
ALK PHOS: 121 U/L (ref 38–126)
ALT: 11 U/L — ABNORMAL LOW (ref 14–54)
ANION GAP: 11 (ref 5–15)
AST: 18 U/L (ref 15–41)
BUN: 40 mg/dL — AB (ref 6–20)
CALCIUM: 9.1 mg/dL (ref 8.9–10.3)
CO2: 26 mmol/L (ref 22–32)
Chloride: 103 mmol/L (ref 101–111)
Creatinine, Ser: 1.77 mg/dL — ABNORMAL HIGH (ref 0.44–1.00)
GFR calc Af Amer: 31 mL/min — ABNORMAL LOW (ref >60)
GFR calc non Af Amer: 27 mL/min — ABNORMAL LOW (ref >60)
GLUCOSE: 97 mg/dL (ref 65–99)
Potassium: 4 mmol/L (ref 3.5–5.1)
SODIUM: 140 mmol/L (ref 135–145)
Total Bilirubin: 0.5 mg/dL (ref 0.3–1.2)
Total Protein: 7.9 g/dL (ref 6.5–8.1)

## 2015-12-20 LAB — URINALYSIS, ROUTINE W REFLEX MICROSCOPIC
BILIRUBIN URINE: NEGATIVE
Glucose, UA: NEGATIVE mg/dL
HGB URINE DIPSTICK: NEGATIVE
Ketones, ur: NEGATIVE mg/dL
Leukocytes, UA: NEGATIVE
NITRITE: NEGATIVE
PROTEIN: NEGATIVE mg/dL
Specific Gravity, Urine: 1.02 (ref 1.005–1.030)
pH: 5.5 (ref 5.0–8.0)

## 2015-12-20 LAB — FERRITIN: Ferritin: 127 ng/mL (ref 11–307)

## 2015-12-20 LAB — IRON AND TIBC
Iron: 31 ug/dL (ref 28–170)
SATURATION RATIOS: 14 % (ref 10.4–31.8)
TIBC: 221 ug/dL — ABNORMAL LOW (ref 250–450)
UIBC: 190 ug/dL

## 2015-12-20 LAB — URIC ACID: URIC ACID, SERUM: 11.7 mg/dL — AB (ref 2.3–6.6)

## 2015-12-20 NOTE — Assessment & Plan Note (Addendum)
Iron deficiency anemia in the setting of Stage II chronic renal disease, requiring infrequent IV replacement, and rheumatoid arthritis followed by Dr. Leary Roca (Rheum).  S/P EGD/colonoscopy by Dr. Laural Golden on 03/04/2015 demonstrating a small AVM in duodenum, sub-optimal prep on colonoscopy with small polyps which could have hindered the ability to identify AVMs. No camera capsule study at this time.  Labs today: CBC diff, CMET, iron/TIBC, ferritin, uric acid.  I personally reviewed and went over laboratory results with the patient.  The results are noted within this dictation. Anemia is noted and stable, yet significant.  Hyperuricemia is also noted.  We will send a copy of lab results to rheumatology as well.  If her anemia persists, then we will need to discuss ESA therapy with Aranesp at renal dosing and schedule.  She notes thoracolumbar back pain with urinary frequency.  Recent imaging has been reviewed.  I personally reviewed and went over radiographic studies with the patient.  The results are noted within this dictation.  Order is placed for a UA with reflex.  Labs every 3 weeks: CBC diff, CMET.  Labs in 12 weeks: CBC diff, CMET, iron/TIBC, ferritin.  Return in 12 weeks for follow-up.

## 2015-12-20 NOTE — Assessment & Plan Note (Signed)
Iron deficiency anemia.  Labs today and in 12 weeks: iron.TIBC, ferritin.  I personally reviewed and went over laboratory results with the patient.  The results are noted within this dictation.

## 2015-12-20 NOTE — Progress Notes (Signed)
Reeves, MD [address] [place] [address]  Anemia of chronic disease - Plan: CBC with Differential, Comprehensive metabolic panel, CBC with Differential, Comprehensive metabolic panel, CBC with Differential, Comprehensive metabolic panel, CANCELED: CBC with Differential, CANCELED: Comprehensive metabolic panel, CANCELED: Iron and TIBC, CANCELED: Ferritin  Rheumatoid arthritis involving multiple sites, unspecified rheumatoid factor presence (HCC) - Plan: CANCELED: CBC with Differential, CANCELED: Comprehensive metabolic panel  Urinary frequency - Plan: Urinalysis, Routine w reflex microscopic, Urinalysis, Routine w reflex microscopic  Iron deficiency anemia - Plan: CBC with Differential, Iron and TIBC, Ferritin, CBC with Differential, Iron and TIBC, Ferritin  CURRENT THERAPY: IV iron replacement when indicated, continued monitoring of blood counts, and history of PRBC transfusions  INTERVAL HISTORY: [name] 75 y.o. female returns for followup of iron deficiency anemia in the setting of Stage II chronic renal disease, requiring infrequent IV replacement, and rheumatoid arthritis, on Plaquenil. S/P EGD/colonoscopy by Dr. Laural Golden on 03/04/2015 demonstrating a small AVM in duodenum, sub-optimal prep on colonoscopy with small polyps which could have hindered the ability to identify AVMs. No camera capsule study at this time. AND Distant history of left breast cancer in 1992 treated with radial mastectomy followed by 5 years worth of tamoxifen without XRT.  She notes thoracolumbar pain that has been ongoing for quite some time.  I have reviewed the patient's most recent imaging.    She also reports urinary frequency.  She denies any hematuira or malodorous urine.  She reports compliance with her plaquenil.  She reports an upcoming appointment with rheumatology next month.  Review of Systems  Constitutional: Negative.  Negative for chills, fever and weight  loss.  HENT: Negative.   Eyes: Negative.   Respiratory: Negative.   Cardiovascular: Negative.   Gastrointestinal: Negative.   Genitourinary: Positive for frequency. Negative for dysuria, hematuria and urgency.  Musculoskeletal: Positive for back pain.  Skin: Negative.   Neurological: Negative.   Endo/Heme/Allergies: Negative.   Psychiatric/Behavioral: Negative.     Past Medical History:  Diagnosis Date  . Anemia    Hgb of 9-10  . Anemia of chronic disease    Hgb of 9-10 chronically; 06/2010: H&H-10.7/33.5, MCV-81, normal iron studies in 2010   . Arteriosclerotic cardiovascular disease (ASCVD)    Remote PTCA by patient report; LBBB; associated cardiomyopathy, presumed ischemic with EF 40-45% previously, 20% in 06/2009; h/o clinical congestive heart failure; negative stress nuclear in 2009 with inferoseptal and apical scar  . Automatic implantable cardioverter-defibrillator in situ   . Breast cancer ([place])   . HOH (hard of hearing)   . Hyperlipidemia   . Hypertension     Past Surgical History:  Procedure Laterality Date  . BI-VENTRICULAR IMPLANTABLE CARDIOVERTER DEFIBRILLATOR N/A 07/28/2012   Procedure: BI-VENTRICULAR IMPLANTABLE CARDIOVERTER DEFIBRILLATOR  (CRT-D);  Surgeon: Evans Lance, MD;  Location: [facility] CATH LAB;  Service: Cardiovascular;  Laterality: N/A;  . BI-VENTRICULAR IMPLANTABLE CARDIOVERTER DEFIBRILLATOR  (CRT-D)  07/28/2012  . BREAST BIOPSY Bilateral   . CATARACT EXTRACTION W/ INTRAOCULAR LENS IMPLANT Left   . CATARACT EXTRACTION W/PHACO Right 09/15/2013   Procedure: CATARACT EXTRACTION PHACO AND INTRAOCULAR LENS PLACEMENT (IOC);  Surgeon: Elta Guadeloupe T. Gershon Crane, MD;  Location: [facility] ORS;  Service: Ophthalmology;  Laterality: Right;  CDE:  10.74  . COLONOSCOPY N/A 03/04/2015   Procedure: COLONOSCOPY;  Surgeon: Rogene Houston, MD;  Location: [facility] ENDO SUITE;  Service: Endoscopy;  Laterality: N/A;  10:50   . ESOPHAGOGASTRODUODENOSCOPY N/A 03/04/2015   Procedure:  ESOPHAGOGASTRODUODENOSCOPY (EGD);  Surgeon: Rogene Houston, MD;  Location: [facility] ENDO SUITE;  Service: Endoscopy;  Laterality: N/A;  . MASTECTOMY Left 1998  . TOTAL KNEE ARTHROPLASTY Right    Dr.Harrison  . TUBAL LIGATION      Family History  Problem Relation Age of Onset  . Diabetes Father   . Pancreatic cancer Father   . Hypertension Brother     Social History   Social History  . Marital status: Married    Spouse name: N/A  . Number of children: N/A  . Years of education: N/A   Occupational History  . Retired    Social History Main Topics  . Smoking status: Former Smoker    Packs/day: 0.25    Types: Cigarettes    Start date: 09/17/1956    Quit date: 04/24/2007  . Smokeless tobacco: Current User    Types: Chew     Comment: 07/03/2013 "smoked some; don't know how much or how long or when I quit"  . Alcohol use No  . Drug use: No  . Sexual activity: Not Currently   Other Topics Concern  . None   Social History Narrative   Married, lives with spouse   1 daughter, died in 74 in a car accident   Retired - worked in [employer] fund[employer]   No recent travel     PHYSICAL EXAMINATION  ECOG PERFORMANCE STATUS: 1 - Symptomatic but completely ambulatory  Vitals:   12/20/15 1052  BP: (!) 109/48  Pulse: 79  Resp: 18    GENERAL:alert, no distress, well nourished, well developed, comfortable, cooperative and unaccompanied SKIN: skin color, texture, turgor are normal, no rashes or significant lesions HEAD: Normocephalic, No masses, lesions, tenderness or abnormalities EYES: normal, EOMI, Conjunctiva are pink and non-injected EARS: External ears normal OROPHARYNX:lips, buccal mucosa, and tongue normal and mucous membranes are moist  NECK: supple, trachea midline LYMPH:  not examined BREAST:not examined LUNGS: clear to auscultation  HEART: regular rate & rhythm ABDOMEN:abdomen soft, non-tender, normal bowel sounds and no masses or organomegaly BACK: Back symmetric, no  curvature. EXTREMITIES:less then 2 second capillary refill, no joint deformities, effusion, or inflammation, no skin discoloration, no cyanosis  NEURO: alert & oriented x 3 with fluent speech, no focal motor/sensory deficits, gait normal   LABORATORY DATA: CBC    Component Value Date/Time   WBC 6.1 12/20/2015 1010   RBC 3.19 (L) 12/20/2015 1010   HGB 7.8 (L) 12/20/2015 1010   HCT 25.5 (L) 12/20/2015 1010   PLT 303 12/20/2015 1010   MCV 79.9 12/20/2015 1010   MCH 24.5 (L) 12/20/2015 1010   MCHC 30.6 12/20/2015 1010   RDW 16.7 (H) 12/20/2015 1010   LYMPHSABS 1.4 12/20/2015 1010   MONOABS 0.7 12/20/2015 1010   EOSABS 0.2 12/20/2015 1010   BASOSABS 0.1 12/20/2015 1010      Chemistry      Component Value Date/Time   NA 140 12/20/2015 1010   K 4.0 12/20/2015 1010   CL 103 12/20/2015 1010   CO2 26 12/20/2015 1010   BUN 40 (H) 12/20/2015 1010   CREATININE 1.77 (H) 12/20/2015 1010   CREATININE 1.23 (H) 09/17/2014 1347      Component Value Date/Time   CALCIUM 9.1 12/20/2015 1010   ALKPHOS 121 12/20/2015 1010   AST 18 12/20/2015 1010   ALT 11 (L) 12/20/2015 1010   BILITOT 0.5 12/20/2015 1010        PENDING LABS:   RADIOGRAPHIC STUDIES:  Dg Chest 2 View  Result Date: 12/13/2015 CLINICAL DATA:  Follow-up of aspiration pneumonia EXAM: CHEST  2 VIEW COMPARISON:  PA and lateral chest x-ray of November 29, 2015 FINDINGS: The lungs are mildly hyperinflated. There remains increased density projecting over the lower thoracic spine. There is no pleural effusion. The cardiac silhouette is mildly enlarged though stable. The pulmonary vascularity is normal. The permanent pacemaker defibrillator is in stable position. There is calcification within the wall of the aortic arch. There is mild multilevel degenerative disc disease of the thoracic spine. IMPRESSION: COPD. Persistent increased density at the left lung base posteriorly. This has improved somewhat since the previous study. An  additional follow-up chest x-ray in 3-4 weeks is recommended assuming the patient's symptoms are improving. Cardiomegaly without pulmonary edema. The ICD is in reasonable position. Aortic atherosclerosis. Electronically Signed   By: David  Martinique M.D.   On: 12/13/2015 11:24   Dg Chest 2 View  Result Date: 11/29/2015 CLINICAL DATA:  Productive cough but it is clear. Cough x 5 days. Chest pain when she coughs. EXAM: CHEST  2 VIEW COMPARISON:  Chest CT, 08/31/2015.  Chest radiographs, 06/13/2015. FINDINGS: Cardiac silhouette is mildly enlarged. Normal mediastinal and hilar contours. Cardiac pacemaker is stable. There is lung base opacity best seen on the lateral view, more prominently in the left lower lobe that in the right lower lobe. This may all be due to atelectasis and chronic interstitial thickening. A component of bronchitis or even pneumonia should be considered, however, given history. Interstitial prominence is seen diffusely predominating in the lower lungs. This is stable. Lungs are hyperexpanded. No pneumothorax. Skeletal structures are demineralized but grossly intact. IMPRESSION: 1. Bibasilar lung opacity, greater in the left lower lobe. This may reflect combination of atelectasis and chronic interstitial thickening. A component of pneumonia and/or bronchitis should be considered. 2. No other evidence of acute cardiopulmonary disease. 3. Stable mild cardiomegaly. Stable changes of COPD with interstitial thickening. Electronically Signed   By: Lajean Manes M.D.   On: 11/29/2015 16:20   Dg Lumbar Spine Complete  Result Date: 12/02/2015 CLINICAL DATA:  Low back pain.  No reported injury. EXAM: LUMBAR SPINE - COMPLETE 4+ VIEW COMPARISON:  None. FINDINGS: This report assumes 5 non rib-bearing lumbar vertebrae. Lumbar vertebral body heights are preserved, with no fracture. Moderate degenerative disc disease is seen in the lower thoracic spine. Mild spondylosis is seen in the lumbar spine, with mild  loss of disc height at L2-3 and L4-5. Mild 5 mm anterolisthesis at L4-5. Moderate facet arthropathy is seen bilaterally in the lower lumbar spine. No aggressive appearing focal osseous lesions. ICD leads are seen overlying the lower heart. Aortic atherosclerosis. IMPRESSION: 1. Moderate lower thoracic spine and mild lumbar spine spondylosis as described . 2. Mild 5 mm anterolisthesis at L4-5. 3. Aortic atherosclerosis. Electronically Signed   By: Ilona Sorrel M.D.   On: 12/02/2015 20:14     PATHOLOGY:    ASSESSMENT AND PLAN:  Anemia of chronic disease Iron deficiency anemia in the setting of Stage II chronic renal disease, requiring infrequent IV replacement, and rheumatoid arthritis followed by Dr. Leary Roca (Rheum).  S/P EGD/colonoscopy by Dr. Laural Golden on 03/04/2015 demonstrating a small AVM in duodenum, sub-optimal prep on colonoscopy with small polyps which could have hindered the ability to identify AVMs. No camera capsule study at this time.  Labs today: CBC diff, CMET, iron/TIBC, ferritin, uric acid.  I personally reviewed and went over laboratory results with the patient.  The results are noted within this  dictation. Anemia is noted and stable, yet significant.  Hyperuricemia is also noted.  We will send a copy of lab results to rheumatology as well.  If her anemia persists, then we will need to discuss ESA therapy with Aranesp at renal dosing and schedule.  She notes thoracolumbar back pain with urinary frequency.  Recent imaging has been reviewed.  I personally reviewed and went over radiographic studies with the patient.  The results are noted within this dictation.  Order is placed for a UA with reflex.  Labs every 3 weeks: CBC diff, CMET.  Labs in 12 weeks: CBC diff, CMET, iron/TIBC, ferritin.  Return in 12 weeks for follow-up.  Rheumatoid arthritis Followed and managed by Dr. Leary Roca (Rheum)  Iron deficiency anemia Iron deficiency anemia.  Labs today and in 12  weeks: iron.TIBC, ferritin.  I personally reviewed and went over laboratory results with the patient.  The results are noted within this dictation.   ORDERS PLACED FOR THIS ENCOUNTER: Orders Placed This Encounter  Procedures  . Urinalysis, Routine w reflex microscopic  . CBC with Differential  . Comprehensive metabolic panel  . CBC with Differential  . Comprehensive metabolic panel  . Iron and TIBC  . Ferritin  . CBC with Differential  . Comprehensive metabolic panel  . Iron and TIBC  . Ferritin    MEDICATIONS PRESCRIBED THIS ENCOUNTER: No orders of the defined types were placed in this encounter.   THERAPY PLAN:  Continue to monitor counts, including iron studies per Dr. Leary Roca (Rheum) orders mentioned above.  Continue follow-up with rheumatology as directed.  Will need to consider ESA therapy in the near future based upon her lab results moving forward.  All questions were answered. The patient knows to call the clinic with any problems, questions or concerns. We can certainly see the patient much sooner if necessary.  Patient and plan discussed with Dr. Ancil Linsey and she is in agreement with the aforementioned.   This note is electronically signed by: Doy Mince 12/20/2015 9:23 PM

## 2015-12-20 NOTE — Patient Instructions (Signed)
[facility] at [facility] Discharge Instructions  RECOMMENDATIONS MADE BY THE CONSULTANT AND ANY TEST RESULTS WILL BE SENT TO YOUR REFERRING PHYSICIAN.  You saw Kirby Crigler PA-C today. We sent your urine to the lab for tests and will call you with the results.  You will need to get labs done every 3 weeks. Return in 12 weeks to see Dr.Penland.   Thank you for choosing [place] at [facility] to provide your oncology and hematology care.  To afford each patient quality time with our provider, please arrive at least 15 minutes before your scheduled appointment time.   Beginning January 23rd 2017 lab work for the [facility] will be done in the  Main lab at [facility] on 1st floor. If you have a lab appointment with the [facility] please come in thru the  Main Entrance and check in at the main information desk  You need to re-schedule your appointment should you arrive 10 or more minutes late.  We strive to give you quality time with our providers, and arriving late affects you and other patients whose appointments are after yours.  Also, if you no show three or more times for appointments you may be dismissed from the clinic at the providers discretion.     Again, thank you for choosing [facility].  Our hope is that these requests will decrease the amount of time that you wait before being seen by our physicians.       _____________________________________________________________  Should you have questions after your visit to [facility], please contact our office at [phone] between the hours of 8:30 a.m. and 4:30 p.m.  Voicemails left after 4:30 p.m. will not be returned until the following business day.  For prescription refill requests, have your pharmacy contact our office.         Resources For Cancer Patients and their Caregivers ? American Cancer Society: Can assist with transportation, wigs,  general needs, runs Look Good Feel Better.        270-418-2340 ? Cancer Care: Provides financial assistance, online support groups, medication/co-pay assistance.  1-800-813-HOPE (919)480-0027) ? [facility] Assists [place] cancer patients and their families through emotional , educational and financial support.  804-535-8250 ? [place] DSS Where to apply for food stamps, Medicaid and utility assistance. (225) 616-1032 ? [facility]: Transportation to medical appointments. 360-372-4490 ? Social Security Administration: May apply for disability if have a Stage IV cancer. 727-469-4755 985-289-9798 ? [employer], Disability and Transit Services: Assists with nutrition, care and transit needs. [facility] Support Programs: @10RELATIVEDAYS @ > Cancer Support Group  2nd Tuesday of the month 1pm-2pm, Journey Room  > Creative Journey  3rd Tuesday of the month 1130am-1pm, Journey Room  > Look Good Feel Better  1st Wednesday of the month 10am-12 noon, Journey Room (Call [place] to register [phone])

## 2015-12-20 NOTE — Assessment & Plan Note (Signed)
Followed and managed by Dr. Leary Roca (Rheum)

## 2015-12-21 ENCOUNTER — Telehealth (HOSPITAL_COMMUNITY): Payer: Self-pay | Admitting: *Deleted

## 2015-12-21 NOTE — Telephone Encounter (Signed)
Pt aware that urine sent yesterday was negative for any infection .

## 2015-12-21 NOTE — Telephone Encounter (Signed)
-----   Message from [facility], PA-C sent at 12/21/2015 12:52 PM EDT ----- Negative for any infection

## 2016-01-04 ENCOUNTER — Ambulatory Visit (INDEPENDENT_AMBULATORY_CARE_PROVIDER_SITE_OTHER): Payer: Medicare Other

## 2016-01-04 ENCOUNTER — Telehealth: Payer: Self-pay

## 2016-01-04 DIAGNOSIS — Z95810 Presence of automatic (implantable) cardiac defibrillator: Secondary | ICD-10-CM

## 2016-01-04 DIAGNOSIS — I5022 Chronic systolic (congestive) heart failure: Secondary | ICD-10-CM | POA: Diagnosis not present

## 2016-01-04 NOTE — Telephone Encounter (Signed)
Attempted call to [name] [facility]) and advised to have patient send a ICM remote transmission from home today.  Provided call back number.

## 2016-01-05 ENCOUNTER — Telehealth: Payer: Self-pay

## 2016-01-05 NOTE — Progress Notes (Signed)
EPIC Encounter for ICM Monitoring  Patient Name: [name] is a 75 y.o. female Date: 01/05/2016 Primary Care Physican: [name], MD Primary Cardiologist: [name] Electrophysiologist: Lovena Le Dry Weight:  unknown Bi-V Pacing:  95.2%       Spoke with [name], nephew.  Heart Failure questions reviewed, pt asymptomatic   Thoracic impedance abnormal suggesting fluid accumulation but trending close to baseline 01/04/2016.  LABS: 12/20/2015 Creatinine 1.77, BUN 40, Potassium 4.0, Sodium 140, GFR 27-31 12/02/2015 Creatinine 1.38, BUN 28, Potassium 4.0, Sodium 136, GFR 36-42 11/29/2015 Creatinine 1.68, BUN 31, Potassium 4.1, Sodium 137, GFR 28-33 11/02/2015 Creatinine 1.73, BUN 41, Potassium 4.5, Sodium 137, GFR 27-32 10/21/2015 Creatinine 2.01, BUN 35, Potassium 5.0, Sodium 136, GFR 23-26 10/12/2015 Creatinine 1.61, BUN 30, Potassium 4.0, Sodium 138, GFR 30-34 10/06/2015 Creatinine 1.45, BUN 33, Potassium 3.7, Sodium 138, GFR 34-39 09/30/2015 Creatinine 1.55, BUN 37, Potassium 4.2, Sodium 139, GFR 31-36 09/21/2015 Creatinine 1.72, BUN 49, Potassium 3.7, Sodium 138, GFR 27-32  Recommendations:   Sending to Dr [name] and Dr Lovena Le for review and will call back if any recommendations.   Advised for her to follow low salt diet.  Repeat fluid levels 01/10/2016.  Follow-up plan: ICM clinic phone appointment on 01/10/2016.  ICM trend: 01/05/2016       [name], RN 01/05/2016 3:08 PM

## 2016-01-05 NOTE — Telephone Encounter (Signed)
Remote ICM transmission received.  Attempted call to patients nephew, [name] and left message for return call.

## 2016-01-06 NOTE — Progress Notes (Signed)
Herminio Commons, MD  [name], RN        No changes.

## 2016-01-10 ENCOUNTER — Telehealth: Payer: Self-pay

## 2016-01-10 ENCOUNTER — Other Ambulatory Visit (HOSPITAL_COMMUNITY): Payer: Medicare Other

## 2016-01-10 ENCOUNTER — Ambulatory Visit (INDEPENDENT_AMBULATORY_CARE_PROVIDER_SITE_OTHER): Payer: Medicare Other

## 2016-01-10 DIAGNOSIS — I5022 Chronic systolic (congestive) heart failure: Secondary | ICD-10-CM

## 2016-01-10 DIAGNOSIS — Z95810 Presence of automatic (implantable) cardiac defibrillator: Secondary | ICD-10-CM

## 2016-01-10 NOTE — Telephone Encounter (Signed)
Remote ICM transmission received.  Attempted call to nephew [name] and left detailed message and requested a return call if needed.  Next ICM remote transmission 01/30/2016.

## 2016-01-10 NOTE — Progress Notes (Signed)
EPIC Encounter for ICM Monitoring  Patient Name: [name] is a 75 y.o. female Date: 01/10/2016 Primary Care Physican: [name], MD Primary [place] Electrophysiologist: Lovena Le Dry Weight:  unknown Bi-V Pacing:  96%       Attempted ICM call and unable to reach.  Transmission reviewed.   Thoracic impedance returned to normal since last ICM check on 9/13.  LABS: 12/20/2015 Creatinine 1.77, BUN 40, Potassium 4.0, Sodium 140, GFR 27-31 12/02/2015 Creatinine 1.38, BUN 28, Potassium 4.0, Sodium 136, GFR 36-42 11/29/2015 Creatinine 1.68, BUN 31, Potassium 4.1, Sodium 137, GFR 28-33 11/02/2015 Creatinine 1.73, BUN 41, Potassium 4.5, Sodium 137, GFR 27-32 10/21/2015 Creatinine 2.01, BUN 35, Potassium 5.0, Sodium 136, GFR 23-26 10/12/2015 Creatinine 1.61, BUN 30, Potassium 4.0, Sodium 138, GFR 30-34 10/06/2015 Creatinine 1.45, BUN 33, Potassium 3.7, Sodium 138, GFR 34-39 09/30/2015 Creatinine 1.55, BUN 37, Potassium 4.2, Sodium 139, GFR 31-36 09/21/2015 Creatinine 1.72, BUN 49, Potassium 3.7, Sodium 138, GFR 27-32   Recommendations:  None. Unable to reach.   Follow-up plan: ICM clinic phone appointment on 01/30/2016.  Copy of ICM check sent to primary cardiologist and device physician.   ICM trend: 01/10/2016       [name], RN 01/10/2016 8:32 AM

## 2016-01-10 NOTE — Progress Notes (Signed)
Nephew returned call.  He stated he did talk with patient about the transmission showed fluid accumulation.  She admitted to eating foods high in sodium and did have a little weight gain.  Recommended she limit salt intake to 2000 mg.  Next ICM remote transmission 01/30/2016.

## 2016-01-11 ENCOUNTER — Other Ambulatory Visit (HOSPITAL_COMMUNITY): Payer: Self-pay | Admitting: Oncology

## 2016-01-11 ENCOUNTER — Encounter (HOSPITAL_COMMUNITY): Payer: Medicare Other | Attending: Oncology

## 2016-01-11 DIAGNOSIS — D638 Anemia in other chronic diseases classified elsewhere: Secondary | ICD-10-CM | POA: Diagnosis not present

## 2016-01-11 DIAGNOSIS — D509 Iron deficiency anemia, unspecified: Secondary | ICD-10-CM

## 2016-01-11 LAB — CBC WITH DIFFERENTIAL/PLATELET
BASOS ABS: 0.1 10*3/uL (ref 0.0–0.1)
BASOS PCT: 1 %
EOS PCT: 4 %
Eosinophils Absolute: 0.2 10*3/uL (ref 0.0–0.7)
HCT: 25.7 % — ABNORMAL LOW (ref 36.0–46.0)
Hemoglobin: 7.8 g/dL — ABNORMAL LOW (ref 12.0–15.0)
LYMPHS PCT: 23 %
Lymphs Abs: 1.4 10*3/uL (ref 0.7–4.0)
MCH: 23.6 pg — ABNORMAL LOW (ref 26.0–34.0)
MCHC: 30.4 g/dL (ref 30.0–36.0)
MCV: 77.6 fL — AB (ref 78.0–100.0)
MONO ABS: 0.8 10*3/uL (ref 0.1–1.0)
Monocytes Relative: 13 %
Neutro Abs: 3.7 10*3/uL (ref 1.7–7.7)
Neutrophils Relative %: 60 %
PLATELETS: 303 10*3/uL (ref 150–400)
RBC: 3.31 MIL/uL — AB (ref 3.87–5.11)
RDW: 16.8 % — AB (ref 11.5–15.5)
WBC: 5.9 10*3/uL (ref 4.0–10.5)

## 2016-01-11 LAB — COMPREHENSIVE METABOLIC PANEL
ALBUMIN: 3.1 g/dL — AB (ref 3.5–5.0)
ALT: 8 U/L — AB (ref 14–54)
AST: 16 U/L (ref 15–41)
Alkaline Phosphatase: 103 U/L (ref 38–126)
Anion gap: 7 (ref 5–15)
BUN: 38 mg/dL — AB (ref 6–20)
CO2: 24 mmol/L (ref 22–32)
CREATININE: 1.48 mg/dL — AB (ref 0.44–1.00)
Calcium: 8.7 mg/dL — ABNORMAL LOW (ref 8.9–10.3)
Chloride: 104 mmol/L (ref 101–111)
GFR calc Af Amer: 38 mL/min — ABNORMAL LOW (ref >60)
GFR calc non Af Amer: 33 mL/min — ABNORMAL LOW (ref >60)
GLUCOSE: 109 mg/dL — AB (ref 65–99)
POTASSIUM: 4.1 mmol/L (ref 3.5–5.1)
SODIUM: 135 mmol/L (ref 135–145)
Total Bilirubin: 0.5 mg/dL (ref 0.3–1.2)
Total Protein: 7.9 g/dL (ref 6.5–8.1)

## 2016-01-27 ENCOUNTER — Other Ambulatory Visit (HOSPITAL_COMMUNITY): Payer: Self-pay | Admitting: Internal Medicine

## 2016-01-27 DIAGNOSIS — Z1231 Encounter for screening mammogram for malignant neoplasm of breast: Secondary | ICD-10-CM

## 2016-01-30 ENCOUNTER — Ambulatory Visit (INDEPENDENT_AMBULATORY_CARE_PROVIDER_SITE_OTHER): Payer: Medicare Other | Admitting: *Deleted

## 2016-01-30 ENCOUNTER — Other Ambulatory Visit (HOSPITAL_COMMUNITY): Payer: Self-pay

## 2016-01-30 ENCOUNTER — Telehealth: Payer: Self-pay

## 2016-01-30 DIAGNOSIS — Z95810 Presence of automatic (implantable) cardiac defibrillator: Secondary | ICD-10-CM

## 2016-01-30 DIAGNOSIS — I5022 Chronic systolic (congestive) heart failure: Secondary | ICD-10-CM | POA: Diagnosis not present

## 2016-01-30 DIAGNOSIS — D638 Anemia in other chronic diseases classified elsewhere: Secondary | ICD-10-CM

## 2016-01-30 DIAGNOSIS — I255 Ischemic cardiomyopathy: Secondary | ICD-10-CM

## 2016-01-30 NOTE — Telephone Encounter (Signed)
Remote ICM transmission received.  Attempted call to nephew [name] and left message to return call.

## 2016-01-30 NOTE — Progress Notes (Signed)
EPIC Encounter for ICM Monitoring  Patient Name: [name] is a 75 y.o. female Date: 01/30/2016 Primary Care Physican: [name], MD Primary [place] Electrophysiologist: Lovena Le Dry Weight:     unknown Bi-V Pacing:  94.9%          Attempted ICM call and unable to reach.  Transmission reviewed.   Thoracic impedance normal.  LABS: 12/20/2015 Creatinine 1.77, BUN 40, Potassium 4.0, Sodium 140, GFR 27-31 12/02/2015 Creatinine 1.38, BUN 28, Potassium 4.0, Sodium 136, GFR 36-42 11/29/2015 Creatinine 1.68, BUN 31, Potassium 4.1, Sodium 137, GFR 28-33 11/02/2015 Creatinine 1.73, BUN 41, Potassium 4.5, Sodium 137, GFR 27-32 10/21/2015 Creatinine 2.01, BUN 35, Potassium 5.0, Sodium 136, GFR 23-26 10/12/2015 Creatinine 1.61, BUN 30, Potassium 4.0, Sodium 138, GFR 30-34 10/06/2015 Creatinine 1.45, BUN 33, Potassium 3.7, Sodium 138, GFR 34-39 09/30/2015 Creatinine 1.55, BUN 37, Potassium 4.2, Sodium 139, GFR 31-36 09/21/2015 Creatinine 1.72, BUN 49, Potassium 3.7, Sodium 138, GFR 27-32  Follow-up plan: ICM clinic phone appointment on 03/01/2016.  Copy of ICM check sent to device physician.   ICM trend: 01/30/2016       [name], RN 01/30/2016 2:55 PM

## 2016-01-30 NOTE — Progress Notes (Signed)
Remote ICD transmission.   

## 2016-01-31 ENCOUNTER — Encounter (HOSPITAL_COMMUNITY): Payer: Medicare Other | Attending: Hematology & Oncology

## 2016-01-31 ENCOUNTER — Encounter: Payer: Self-pay | Admitting: Cardiology

## 2016-01-31 ENCOUNTER — Telehealth: Payer: Self-pay | Admitting: Internal Medicine

## 2016-01-31 DIAGNOSIS — D638 Anemia in other chronic diseases classified elsewhere: Secondary | ICD-10-CM

## 2016-01-31 DIAGNOSIS — D509 Iron deficiency anemia, unspecified: Secondary | ICD-10-CM | POA: Diagnosis present

## 2016-01-31 LAB — CBC WITH DIFFERENTIAL/PLATELET
BASOS PCT: 0 %
Basophils Absolute: 0 10*3/uL (ref 0.0–0.1)
Eosinophils Absolute: 0.2 10*3/uL (ref 0.0–0.7)
Eosinophils Relative: 3 %
HEMATOCRIT: 24.6 % — AB (ref 36.0–46.0)
HEMOGLOBIN: 7.6 g/dL — AB (ref 12.0–15.0)
LYMPHS PCT: 18 %
Lymphs Abs: 1.3 10*3/uL (ref 0.7–4.0)
MCH: 23.5 pg — ABNORMAL LOW (ref 26.0–34.0)
MCHC: 30.9 g/dL (ref 30.0–36.0)
MCV: 75.9 fL — AB (ref 78.0–100.0)
MONOS PCT: 11 %
Monocytes Absolute: 0.8 10*3/uL (ref 0.1–1.0)
NEUTROS ABS: 5 10*3/uL (ref 1.7–7.7)
NEUTROS PCT: 68 %
Platelets: 265 10*3/uL (ref 150–400)
RBC: 3.24 MIL/uL — ABNORMAL LOW (ref 3.87–5.11)
RDW: 17.1 % — ABNORMAL HIGH (ref 11.5–15.5)
WBC: 7.4 10*3/uL (ref 4.0–10.5)

## 2016-01-31 LAB — FERRITIN: Ferritin: 95 ng/mL (ref 11–307)

## 2016-01-31 LAB — COMPREHENSIVE METABOLIC PANEL
ALBUMIN: 3.1 g/dL — AB (ref 3.5–5.0)
ALK PHOS: 103 U/L (ref 38–126)
ALT: 11 U/L — ABNORMAL LOW (ref 14–54)
ANION GAP: 7 (ref 5–15)
AST: 17 U/L (ref 15–41)
BILIRUBIN TOTAL: 0.5 mg/dL (ref 0.3–1.2)
BUN: 34 mg/dL — ABNORMAL HIGH (ref 6–20)
CALCIUM: 9 mg/dL (ref 8.9–10.3)
CO2: 23 mmol/L (ref 22–32)
CREATININE: 1.31 mg/dL — AB (ref 0.44–1.00)
Chloride: 107 mmol/L (ref 101–111)
GFR calc Af Amer: 44 mL/min — ABNORMAL LOW (ref >60)
GFR calc non Af Amer: 38 mL/min — ABNORMAL LOW (ref >60)
Glucose, Bld: 97 mg/dL (ref 65–99)
Potassium: 4.5 mmol/L (ref 3.5–5.1)
Sodium: 137 mmol/L (ref 135–145)
Total Protein: 7.9 g/dL (ref 6.5–8.1)

## 2016-01-31 LAB — IRON AND TIBC
Iron: 19 ug/dL — ABNORMAL LOW (ref 28–170)
Saturation Ratios: 9 % — ABNORMAL LOW (ref 10.4–31.8)
TIBC: 218 ug/dL — AB (ref 250–450)
UIBC: 199 ug/dL

## 2016-01-31 NOTE — Telephone Encounter (Signed)
New message  Pt nephew returning RN call. Pt nephew states its okay to leave a detailed message on vm. Please call back to discuss

## 2016-01-31 NOTE — Progress Notes (Signed)
[name] returned call.   Patient is doing well and no fluid symptoms.  Reviewed transmission.  Advised next ICM remote transmission is 03/01/2016 and encouraged to call should she have any fluid symptoms.

## 2016-02-01 ENCOUNTER — Other Ambulatory Visit (HOSPITAL_COMMUNITY): Payer: Self-pay | Admitting: Oncology

## 2016-02-01 ENCOUNTER — Encounter (HOSPITAL_COMMUNITY): Payer: Self-pay | Admitting: Oncology

## 2016-02-01 DIAGNOSIS — N183 Chronic kidney disease, stage 3 unspecified: Secondary | ICD-10-CM

## 2016-02-01 DIAGNOSIS — N1832 Chronic kidney disease, stage 3b: Secondary | ICD-10-CM | POA: Insufficient documentation

## 2016-02-01 HISTORY — DX: Chronic kidney disease, stage 3 unspecified: N18.30

## 2016-02-01 LAB — CUP PACEART REMOTE DEVICE CHECK
Battery Voltage: 2.98 V
Brady Statistic RA Percent Paced: 21.17 %
Date Time Interrogation Session: 20171009041808
HIGH POWER IMPEDANCE MEASURED VALUE: 78 Ohm
Implantable Lead Location: 753859
Implantable Lead Model: 4194
Implantable Lead Model: 5076
Implantable Lead Model: 6935
Lead Channel Impedance Value: 380 Ohm
Lead Channel Impedance Value: 380 Ohm
Lead Channel Pacing Threshold Amplitude: 1.25 V
Lead Channel Pacing Threshold Pulse Width: 0.4 ms
Lead Channel Pacing Threshold Pulse Width: 0.5 ms
Lead Channel Sensing Intrinsic Amplitude: 28.5 mV
Lead Channel Setting Pacing Amplitude: 2 V
Lead Channel Setting Pacing Pulse Width: 0.5 ms
MDC IDC LEAD IMPLANT DT: 20140407
MDC IDC LEAD IMPLANT DT: 20140407
MDC IDC LEAD IMPLANT DT: 20140407
MDC IDC LEAD LOCATION: 753858
MDC IDC LEAD LOCATION: 753860
MDC IDC MSMT BATTERY REMAINING LONGEVITY: 70 mo
MDC IDC MSMT LEADCHNL LV IMPEDANCE VALUE: 380 Ohm
MDC IDC MSMT LEADCHNL LV IMPEDANCE VALUE: 646 Ohm
MDC IDC MSMT LEADCHNL LV IMPEDANCE VALUE: 893 Ohm
MDC IDC MSMT LEADCHNL LV PACING THRESHOLD AMPLITUDE: 0.625 V
MDC IDC MSMT LEADCHNL RA PACING THRESHOLD AMPLITUDE: 0.5 V
MDC IDC MSMT LEADCHNL RA PACING THRESHOLD PULSEWIDTH: 0.4 ms
MDC IDC MSMT LEADCHNL RA SENSING INTR AMPL: 3.125 mV
MDC IDC MSMT LEADCHNL RA SENSING INTR AMPL: 3.125 mV
MDC IDC MSMT LEADCHNL RV IMPEDANCE VALUE: 494 Ohm
MDC IDC MSMT LEADCHNL RV SENSING INTR AMPL: 28.5 mV
MDC IDC SET LEADCHNL LV PACING AMPLITUDE: 1.75 V
MDC IDC SET LEADCHNL RV SENSING SENSITIVITY: 0.3 mV
MDC IDC STAT BRADY AP VP PERCENT: 20.14 %
MDC IDC STAT BRADY AP VS PERCENT: 1.03 %
MDC IDC STAT BRADY AS VP PERCENT: 77.44 %
MDC IDC STAT BRADY AS VS PERCENT: 1.39 %
MDC IDC STAT BRADY RV PERCENT PACED: 11.67 %

## 2016-02-01 NOTE — Telephone Encounter (Signed)
Spoke with nephew.  See ICM Note

## 2016-02-09 ENCOUNTER — Encounter (HOSPITAL_BASED_OUTPATIENT_CLINIC_OR_DEPARTMENT_OTHER): Payer: Medicare Other

## 2016-02-09 VITALS — BP 95/48 | HR 78 | Temp 97.9°F | Resp 20

## 2016-02-09 DIAGNOSIS — N183 Chronic kidney disease, stage 3 unspecified: Secondary | ICD-10-CM

## 2016-02-09 DIAGNOSIS — N182 Chronic kidney disease, stage 2 (mild): Secondary | ICD-10-CM

## 2016-02-09 DIAGNOSIS — D509 Iron deficiency anemia, unspecified: Secondary | ICD-10-CM

## 2016-02-09 DIAGNOSIS — D508 Other iron deficiency anemias: Secondary | ICD-10-CM

## 2016-02-09 MED ORDER — SODIUM CHLORIDE 0.9 % IV SOLN
Freq: Once | INTRAVENOUS | Status: AC
  Administered 2016-02-09: 15:00:00 via INTRAVENOUS

## 2016-02-09 MED ORDER — SODIUM CHLORIDE 0.9 % IV SOLN
125.0000 mg | Freq: Once | INTRAVENOUS | Status: AC
  Administered 2016-02-09: 125 mg via INTRAVENOUS
  Filled 2016-02-09: qty 10

## 2016-02-09 NOTE — Progress Notes (Signed)
[name] tolerated ferric gluconate infusion well without complaints or incident.VSS upon discharge. Pt discharged via wheelchair in satisfactory condition

## 2016-02-09 NOTE — Patient Instructions (Signed)
[place] at [facility] Discharge Instructions  RECOMMENDATIONS MADE BY THE CONSULTANT AND ANY TEST RESULTS WILL BE SENT TO YOUR REFERRING PHYSICIAN.  Received Iron infusion today. Follow-up as scheduled. Call clinic for any questions or concerns  Thank you for choosing [place] at [facility] to provide your oncology and hematology care.  To afford each patient quality time with our provider, please arrive at least 15 minutes before your scheduled appointment time.   Beginning January 23rd 2017 lab work for the [facility] will be done in the  Main lab at [facility] on 1st floor. If you have a lab appointment with the [facility] please come in thru the  Main Entrance and check in at the main information desk  You need to re-schedule your appointment should you arrive 10 or more minutes late.  We strive to give you quality time with our providers, and arriving late affects you and other patients whose appointments are after yours.  Also, if you no show three or more times for appointments you may be dismissed from the clinic at the providers discretion.     Again, thank you for choosing [facility].  Our hope is that these requests will decrease the amount of time that you wait before being seen by our physicians.       _____________________________________________________________  Should you have questions after your visit to [facility], please contact our office at [phone] between the hours of 8:30 a.m. and 4:30 p.m.  Voicemails left after 4:30 p.m. will not be returned until the following business day.  For prescription refill requests, have your pharmacy contact our office.         Resources For Cancer Patients and their Caregivers ? American Cancer Society: Can assist with transportation, wigs, general needs, runs Look Good Feel Better.        (207)171-1718 ? Cancer Care: Provides financial  assistance, online support groups, medication/co-pay assistance.  1-800-813-HOPE (712)200-8198) ? [place] Assists [place] cancer patients and their families through emotional , educational and financial support.  (581) 077-6252 ? [place] DSS Where to apply for food stamps, Medicaid and utility assistance. 478-861-4779 ? [facility]: Transportation to medical appointments. 970-748-4433 ? Social Security Administration: May apply for disability if have a Stage IV cancer. 571-353-2002 (434)366-5020 ? [employer], Disability and Transit Services: Assists with nutrition, care and transit needs. [place] Support Programs: @10RELATIVEDAYS @ > Cancer Support Group  2nd Tuesday of the month 1pm-2pm, Journey Room  > Creative Journey  3rd Tuesday of the month 1130am-1pm, Journey Room  > Look Good Feel Better  1st Wednesday of the month 10am-12 noon, Journey Room (Call [place] to register [phone])

## 2016-02-13 ENCOUNTER — Other Ambulatory Visit (HOSPITAL_COMMUNITY): Payer: Medicare Other

## 2016-02-20 ENCOUNTER — Other Ambulatory Visit (HOSPITAL_COMMUNITY): Payer: Medicare Other

## 2016-03-01 ENCOUNTER — Ambulatory Visit (INDEPENDENT_AMBULATORY_CARE_PROVIDER_SITE_OTHER): Payer: Medicare Other

## 2016-03-01 DIAGNOSIS — Z95810 Presence of automatic (implantable) cardiac defibrillator: Secondary | ICD-10-CM | POA: Diagnosis not present

## 2016-03-01 DIAGNOSIS — I5022 Chronic systolic (congestive) heart failure: Secondary | ICD-10-CM | POA: Diagnosis not present

## 2016-03-02 ENCOUNTER — Telehealth: Payer: Self-pay

## 2016-03-02 NOTE — Progress Notes (Signed)
EPIC Encounter for ICM Monitoring  Patient Name: [name] is a 75 y.o. female Date: 03/02/2016 Primary Care Physican: [name], MD Primary [place] Electrophysiologist: Lovena Le Dry Weight:unknown Bi-V Pacing: 93.7%              Attempted ICM call and unable to reach.  Transmission reviewed.   Thoracic impedance abnormal suggesting fluid accumulation but is starting to return to baseline 03/02/2016.  LABS: 01/31/2016 Creatinine 1.31, BUN 34, Potassium 4.5, Sodium 137 GFR 38-44 01/11/2016 Creatinine 1.48, BUN 38, Potassium 4.1, Sodium 135 GFR 33-38 12/20/2015 Creatinine 1.77, BUN 40, Potassium 4.0, Sodium 140, GFR 27-31 12/02/2015 Creatinine 1.38, BUN 28, Potassium 4.0, Sodium 136, GFR 36-42 11/29/2015 Creatinine 1.68, BUN 31, Potassium 4.1, Sodium 137, GFR 28-33 11/02/2015 Creatinine 1.73, BUN 41, Potassium 4.5, Sodium 137, GFR 27-32 10/21/2015 Creatinine 2.01, BUN 35, Potassium 5.0, Sodium 136, GFR 23-26 10/12/2015 Creatinine 1.61, BUN 30, Potassium 4.0, Sodium 138, GFR 30-34 10/06/2015 Creatinine 1.45, BUN 33, Potassium 3.7, Sodium 138, GFR 34-39 09/30/2015 Creatinine 1.55, BUN 37, Potassium 4.2, Sodium 139, GFR 31-36 09/21/2015 Creatinine 1.72, BUN 49, Potassium 3.7, Sodium 138, GFR 27-32   Recommendations:  Unable to reach  Follow-up plan: ICM clinic phone appointment on 03/13/2016 to recheck fluid levels.  Copy of ICM check sent primary cardiologist and device physician.   ICM trend: 03/02/2016       [name], RN 03/02/2016 11:31 AM

## 2016-03-02 NOTE — Telephone Encounter (Signed)
Remote ICM transmission received.  Attempted call to [name] and left message to return call

## 2016-03-03 DIAGNOSIS — M1A09X0 Idiopathic chronic gout, multiple sites, without tophus (tophi): Secondary | ICD-10-CM | POA: Insufficient documentation

## 2016-03-03 DIAGNOSIS — Z79899 Other long term (current) drug therapy: Secondary | ICD-10-CM | POA: Insufficient documentation

## 2016-03-03 DIAGNOSIS — Z96651 Presence of right artificial knee joint: Secondary | ICD-10-CM | POA: Insufficient documentation

## 2016-03-03 DIAGNOSIS — R700 Elevated erythrocyte sedimentation rate: Secondary | ICD-10-CM | POA: Insufficient documentation

## 2016-03-03 NOTE — Progress Notes (Signed)
Office Visit Note  Patient: [name]             Date of Birth: 1940-10-14           MRN: [id number]             PCP: [name], MD Referring: [name], MD Visit Date: 03/06/2016 Occupation: _0 @    Subjective:  Pain in bilateral hands.   History of Present Illness: [name] is a 75 y.o. female with history of sero positive rheumatoid arthritis. She has hearing loss and was accompanied by her nephew today. She states that she has missed several doses of Plaquenil since the last visit and ran out of Plaquenil daily back. We did discontinue her methotrexate few months ago due to low GFR. She's been having pain and swelling in her bilateral hands, bilateral wrists, bilateral knee joints, bilateral ankles and bilateral feet. She also complains of pain and discomfort in her shoulders elbows knee joints and feet. She states that she had a recent urinary tract infection which was treated and now she is having symptoms of urinary frequency and burning again.  Activities of Daily Living:  Patient reports morning stiffness for 30 minutes.   Patient Denies nocturnal pain.  Difficulty dressing/grooming: Denies Difficulty climbing stairs: Denies Difficulty getting out of chair: Denies Difficulty using hands for taps, buttons, cutlery, and/or writing: Reports   Review of Systems  Constitutional: Positive for fatigue. Negative for night sweats, weight gain, weight loss and weakness.  HENT: Negative for mouth sores, trouble swallowing, trouble swallowing, mouth dryness and nose dryness.   Eyes: Negative for pain, redness, visual disturbance and dryness.  Respiratory: Positive for shortness of breath (Only on exertion). Negative for cough and difficulty breathing.   Cardiovascular: Negative for chest pain, palpitations, hypertension, irregular heartbeat and swelling in legs/feet.  Gastrointestinal: Negative for blood in stool, constipation and diarrhea.  Endocrine: Negative  for increased urination.  Genitourinary: Negative for vaginal dryness.  Musculoskeletal: Positive for arthralgias, joint pain, joint swelling and morning stiffness. Negative for myalgias, muscle weakness, muscle tenderness and myalgias.  Skin: Negative for color change, rash, hair loss, skin tightness, ulcers and sensitivity to sunlight.  Allergic/Immunologic: Negative for susceptible to infections.  Neurological: Negative for dizziness, memory loss and night sweats.  Hematological: Negative for swollen glands.  Psychiatric/Behavioral: Positive for sleep disturbance. Negative for depressed mood. The patient is not nervous/anxious.     PMFS History:  Patient Active Problem List   Diagnosis Date Noted  . Elevated sedimentation rate 03/03/2016  . Chronic gout of foot 03/03/2016  . Total knee replacement status, right 03/03/2016  . High risk medication use 03/03/2016  . Chronic kidney disease (CKD) stage G3b/A1, moderately decreased glomerular filtration rate (GFR) between 30-44 mL/min/1.73 square meter and albuminuria creatinine ratio less than 30 mg/g 02/01/2016  . CAD S/P remote PCI- no details 09/02/2014  . Cardiomyopathy, ischemic-EF 30-35% March 2015 09/02/2014  . Diastolic dysfunction-grade 2 09/02/2014  . CHF exacerbation ([place]) 08/28/2014  . Acute on chronic combined systolic and diastolic congestive heart failure ([place]) 08/28/2014  . Iron deficiency anemia 08/20/2013  . Malnutrition of moderate degree ([place]) 07/21/2013  . PNA (pneumonia) 07/19/2013  . HCAP (healthcare-associated pneumonia) 07/17/2013  . Sepsis ([place]) 07/17/2013  . ARF (acute renal failure) ([place]) 07/17/2013  . Rheumatoid arthritis ([place]) 07/04/2013  . Hypokalemia 07/03/2013  . Chest pain 07/03/2013  . Biventricular ICD in place (MDT 2014) 11/06/2012  . Anemia of chronic disease   . Breast  cancer ([place])   . Hypertension   . Dyslipidemia 05/24/2009  . Chronic systolic heart failure ([place]) 05/24/2009    Past Medical  History:  Diagnosis Date  . Anemia    Hgb of 9-10  . Anemia of chronic disease    Hgb of 9-10 chronically; 06/2010: H&H-10.7/33.5, MCV-81, normal iron studies in 2010   . Arteriosclerotic cardiovascular disease (ASCVD)    Remote PTCA by patient report; LBBB; associated cardiomyopathy, presumed ischemic with EF 40-45% previously, 20% in 06/2009; h/o clinical congestive heart failure; negative stress nuclear in 2009 with inferoseptal and apical scar  . Automatic implantable cardioverter-defibrillator in situ   . Breast cancer ([place])    breast  . Chronic bronchitis ([place])   . Chronic renal disease, stage 3, moderately decreased glomerular filtration rate (GFR) between 30-59 mL/min/1.73 square meter 02/01/2016  . Congestive heart failure (CHF) ([place])   . Elevated cholesterol   . Elevated sed rate   . GERD (gastroesophageal reflux disease)   . Gout   . HOH (hard of hearing)   . Hyperlipidemia   . Hypertension   . Rheumatoid arthritis ([place])     Family History  Problem Relation Age of Onset  . Diabetes Father   . Pancreatic cancer Father   . Hypertension Brother    Past Surgical History:  Procedure Laterality Date  . BI-VENTRICULAR IMPLANTABLE CARDIOVERTER DEFIBRILLATOR N/A 07/28/2012   Procedure: BI-VENTRICULAR IMPLANTABLE CARDIOVERTER DEFIBRILLATOR  (CRT-D);  Surgeon: Evans Lance, MD;  Location: [facility] CATH LAB;  Service: Cardiovascular;  Laterality: N/A;  . BI-VENTRICULAR IMPLANTABLE CARDIOVERTER DEFIBRILLATOR  (CRT-D)  07/28/2012  . BREAST BIOPSY Bilateral   . CATARACT EXTRACTION W/ INTRAOCULAR LENS IMPLANT Left   . CATARACT EXTRACTION W/PHACO Right 09/15/2013   Procedure: CATARACT EXTRACTION PHACO AND INTRAOCULAR LENS PLACEMENT (IOC);  Surgeon: Elta Guadeloupe T. Gershon Crane, MD;  Location: [facility] ORS;  Service: Ophthalmology;  Laterality: Right;  CDE:  10.74  . COLONOSCOPY N/A 03/04/2015   Procedure: COLONOSCOPY;  Surgeon: Rogene [place], MD;  Location: [facility] ENDO SUITE;  Service: Endoscopy;  Laterality: N/A;   10:50   . ESOPHAGOGASTRODUODENOSCOPY N/A 03/04/2015   Procedure: ESOPHAGOGASTRODUODENOSCOPY (EGD);  Surgeon: Rogene [place], MD;  Location: [facility] ENDO SUITE;  Service: Endoscopy;  Laterality: N/A;  . MASTECTOMY Left 1998  . TOTAL KNEE ARTHROPLASTY Right    Dr.Harrison  . TUBAL LIGATION     Social History   Social History Narrative   Married, lives with spouse   1 daughter, died in 55 in a car accident   Retired - worked in [employer] fund[employer]   No recent travel     Objective: Vital Signs: BP (!) 116/55 (BP Location: Right Arm, Patient Position: Sitting, Cuff Size: Large)   Pulse 82   Resp 13   Ht 5' 5" (1.651 m)   Wt 150 lb (68 kg)   BMI 24.96 kg/m    Physical Exam  Constitutional: She is oriented to person, place, and time. She appears well-developed and well-nourished.  HENT:  Head: Normocephalic and atraumatic.  Eyes: Conjunctivae and EOM are normal.  Neck: Normal range of motion.  Cardiovascular: Normal rate, regular rhythm, normal heart sounds and intact distal pulses.   Pulmonary/Chest: Effort normal and breath sounds normal.  Abdominal: Soft. Bowel sounds are normal.  Lymphadenopathy:    She has no cervical adenopathy.  Neurological: She is alert and oriented to person, place, and time.  Skin: Skin is warm and dry. Capillary refill takes less than 2 seconds.  Psychiatric: She has  a normal mood and affect. Her behavior is normal.  Nursing note and vitals reviewed.    Musculoskeletal Exam: C-spine good range of motion, she has limited range of motion of her thoracic and lumbar spine. She is good range of motion of her shoulder joints, elbow joints. She has limited range of motion of bilateral wrist joints which were swollen with synovitis over bilateral wrist joints more so in the right wrist joint. She is synovitis over MCPs and PIPs of her hands. She is warmth and swelling over bilateral knee joints, tenderness over her ankle joints and MTP joints.  CDAI Exam: CDAI  Homunculus Exam:   Tenderness:  RUE: glenohumeral and wrist LUE: glenohumeral and wrist Right hand: 2nd MCP, 3rd MCP and 3rd PIP Left hand: 2nd MCP RLE: tibiofemoral and tibiotalar LLE: tibiofemoral Right foot: 1st MTP and 3rd MTP Left foot: 1st MTP, 2nd MTP and 3rd MTP  Swelling:  RUE: wrist LUE: wrist Right hand: 3rd PIP Left hand: 2nd MCP RLE: tibiofemoral LLE: tibiofemoral Right foot: 2nd MTP  Joint Counts:  CDAI Tender Joint count: 10 CDAI Swollen Joint count: 6  Global Assessments:  Patient Global Assessment: 8 Provider Global Assessment: 6  CDAI Calculated Score: 30    Investigation: Findings:  July 2015: TB negative, March 2015 hepatitis negative, SPEP negative, IgA 528, G6PD normal, 12/20/2015: CBC hemoglobin 7.8, CMP creatinine 1.77, GFR 31, uric acid 11.7  CBC    Component Value Date/Time   WBC 7.4 01/31/2016 1110   RBC 3.24 (L) 01/31/2016 1110   HGB 7.6 (L) 01/31/2016 1110   HCT 24.6 (L) 01/31/2016 1110   PLT 265 01/31/2016 1110   MCV 75.9 (L) 01/31/2016 1110   MCH 23.5 (L) 01/31/2016 1110   MCHC 30.9 01/31/2016 1110   RDW 17.1 (H) 01/31/2016 1110   LYMPHSABS 1.3 01/31/2016 1110   MONOABS 0.8 01/31/2016 1110   EOSABS 0.2 01/31/2016 1110   BASOSABS 0.0 01/31/2016 1110   CMP     Component Value Date/Time   NA 137 01/31/2016 1110   K 4.5 01/31/2016 1110   CL 107 01/31/2016 1110   CO2 23 01/31/2016 1110   GLUCOSE 97 01/31/2016 1110   BUN 34 (H) 01/31/2016 1110   CREATININE 1.31 (H) 01/31/2016 1110   CREATININE 1.23 (H) 09/17/2014 1347   CALCIUM 9.0 01/31/2016 1110   PROT 7.9 01/31/2016 1110   ALBUMIN 3.1 (L) 01/31/2016 1110   AST 17 01/31/2016 1110   ALT 11 (L) 01/31/2016 1110   ALKPHOS 103 01/31/2016 1110   BILITOT 0.5 01/31/2016 1110   GFRNONAA 38 (L) 01/31/2016 1110   GFRAA 44 (L) 01/31/2016 1110    Imaging: No results found.  Speciality Comments: No specialty comments available.    Procedures:  No procedures  performed Allergies: Patient has no known allergies.   Assessment / Plan: Visit Diagnoses: Rheumatoid arthritis involving multiple sites with positive rheumatoid factor (HCC) -she is having a flare with synovitis in multiple joints today. +RF,+CCP. Her methotrexate was discontinued due to low GFR and she recently ran out of Plaquenil. We had detailed discussion regarding different treatment options and their side effects after reviewing indications side effects and contraindications were discussed the option of starting her on leflunomide 10 mg by mouth daily. I will start her on the medication after we get results of her recent UA as she is complaining of dysuria. When she starts leflunomide I will check labs in 2 weeks then every 2 months to monitor for drug toxicity.   Handout on leflunomide was given consent was taken.  High risk medication use - Hydroxychloroquine,MTX d/cd due to low GFR. According to patient that I exams have been normal. We will call in leflunomide 10 mg by mouth daily after her labs are available. Her most recent CBC and comprehensive metabolic panel is stable she continues to have severe anemia and low GFR.  Total knee replacement status, right - Dr. Harrison  Chronic gout : No recent flare  Chronic kidney disease (CKD) stage G3b/A1 - GFR 32, GFR stable  She has multiple medical problems including some of them listed below for which she's been followed by other physicians:  Chronic systolic heart failure (HCC)  Anemia of chronic disease  Dyslipidemia  Malignant neoplasm of female breast, - Dr.Penland  Elevated sedimentation rate - ESR> 100 followed by oncologist    Orders: Orders Placed This Encounter  Procedures  . CBC with Differential/Platelet  . COMPLETE METABOLIC PANEL WITH GFR  . Urinalysis, Routine w reflex microscopic (not at [facility])   Meds ordered this encounter  Medications  . hydroxychloroquine (PLAQUENIL) 200 MG tablet    Sig: Take 1 tablet (200  mg total) by mouth daily. Monday-Friday    Dispense:  90 tablet    Refill:  0    Face-to-face time spent with patient was 30 minutes. 50% of time was spent in counseling and coordination of care.  Follow-Up Instructions: Return in about 3 months (around 06/06/2016) for Rheumatoid arthritis.   Shaili Deveshwar, MD    

## 2016-03-05 ENCOUNTER — Encounter: Payer: Self-pay | Admitting: *Deleted

## 2016-03-05 ENCOUNTER — Ambulatory Visit (HOSPITAL_COMMUNITY)
Admission: RE | Admit: 2016-03-05 | Discharge: 2016-03-05 | Disposition: A | Discharge status: Home / Self Care | Payer: Medicare Other | Source: Ambulatory Visit | Attending: Internal Medicine | Admitting: Internal Medicine

## 2016-03-05 DIAGNOSIS — Z1231 Encounter for screening mammogram for malignant neoplasm of breast: Secondary | ICD-10-CM | POA: Insufficient documentation

## 2016-03-06 ENCOUNTER — Ambulatory Visit (INDEPENDENT_AMBULATORY_CARE_PROVIDER_SITE_OTHER): Payer: Medicare Other | Admitting: Rheumatology

## 2016-03-06 ENCOUNTER — Telehealth: Payer: Self-pay

## 2016-03-06 ENCOUNTER — Encounter: Payer: Self-pay | Admitting: Rheumatology

## 2016-03-06 VITALS — BP 116/55 | HR 82 | Resp 13 | Ht 65.0 in | Wt 150.0 lb

## 2016-03-06 DIAGNOSIS — I5022 Chronic systolic (congestive) heart failure: Secondary | ICD-10-CM | POA: Diagnosis not present

## 2016-03-06 DIAGNOSIS — M1A0790 Idiopathic chronic gout, unspecified ankle and foot, without tophus (tophi): Secondary | ICD-10-CM | POA: Diagnosis not present

## 2016-03-06 DIAGNOSIS — D638 Anemia in other chronic diseases classified elsewhere: Secondary | ICD-10-CM | POA: Diagnosis not present

## 2016-03-06 DIAGNOSIS — Z96651 Presence of right artificial knee joint: Secondary | ICD-10-CM

## 2016-03-06 DIAGNOSIS — E785 Hyperlipidemia, unspecified: Secondary | ICD-10-CM | POA: Diagnosis not present

## 2016-03-06 DIAGNOSIS — Z79899 Other long term (current) drug therapy: Secondary | ICD-10-CM

## 2016-03-06 DIAGNOSIS — C50919 Malignant neoplasm of unspecified site of unspecified female breast: Secondary | ICD-10-CM | POA: Diagnosis not present

## 2016-03-06 DIAGNOSIS — R700 Elevated erythrocyte sedimentation rate: Secondary | ICD-10-CM | POA: Diagnosis not present

## 2016-03-06 DIAGNOSIS — M0579 Rheumatoid arthritis with rheumatoid factor of multiple sites without organ or systems involvement: Secondary | ICD-10-CM | POA: Diagnosis not present

## 2016-03-06 DIAGNOSIS — N183 Chronic kidney disease, stage 3 (moderate): Secondary | ICD-10-CM | POA: Diagnosis not present

## 2016-03-06 DIAGNOSIS — N1832 Chronic kidney disease, stage 3b: Secondary | ICD-10-CM

## 2016-03-06 DIAGNOSIS — R300 Dysuria: Secondary | ICD-10-CM | POA: Diagnosis not present

## 2016-03-06 MED ORDER — HYDROXYCHLOROQUINE SULFATE 200 MG PO TABS: 200.0000 mg | ORAL_TABLET | Freq: Every day | ORAL | 0 refills | Status: DC

## 2016-03-06 NOTE — Progress Notes (Signed)
Pharmacy Note  Subjective: Patient presents today to the [facility] to see Dr. Estanislado Pandy.  Patient seen by the pharmacist for counseling on leflunomide [name]).    Objective: CBC    Component Value Date/Time   WBC 7.4 01/31/2016 1110   RBC 3.24 (L) 01/31/2016 1110   HGB 7.6 (L) 01/31/2016 1110   HCT 24.6 (L) 01/31/2016 1110   PLT 265 01/31/2016 1110   MCV 75.9 (L) 01/31/2016 1110   MCH 23.5 (L) 01/31/2016 1110   MCHC 30.9 01/31/2016 1110   RDW 17.1 (H) 01/31/2016 1110   LYMPHSABS 1.3 01/31/2016 1110   MONOABS 0.8 01/31/2016 1110   EOSABS 0.2 01/31/2016 1110   BASOSABS 0.0 01/31/2016 1110   CMP     Component Value Date/Time   NA 137 01/31/2016 1110   K 4.5 01/31/2016 1110   CL 107 01/31/2016 1110   CO2 23 01/31/2016 1110   GLUCOSE 97 01/31/2016 1110   BUN 34 (H) 01/31/2016 1110   CREATININE 1.31 (H) 01/31/2016 1110   CREATININE 1.23 (H) 09/17/2014 1347   CALCIUM 9.0 01/31/2016 1110   PROT 7.9 01/31/2016 1110   ALBUMIN 3.1 (L) 01/31/2016 1110   AST 17 01/31/2016 1110   ALT 11 (L) 01/31/2016 1110   ALKPHOS 103 01/31/2016 1110   BILITOT 0.5 01/31/2016 1110   GFRNONAA 38 (L) 01/31/2016 1110   GFRAA 44 (L) 01/31/2016 1110    TB Gold: negativew (11/02/13)  Pregnancy status:  Post-menopausal  Vitals:   03/06/16 1131  BP: (!) 116/55  Pulse: 82  Resp: 13    Assessment/Plan: Patient is being initiated on leflunomide (Arava) 10 mg daily.  Patient was counseled on the purpose, proper use, and adverse effects of leflunomide including risk of infection, nausea/diarrhea/weight loss, increase in blood pressure, rash, hair loss, tingling in the hands and feet, and signs and symptoms of interstitial lung disease.  Discussed the importance of frequent monitoring of liver function and blood counts, and patient was provided with instructions for standing labs.  Provided patient with educational materials on leflunomide and answered all questions.  Patient consented to  [place] use, and consent will be uploaded into the media tab.    During discussion, patient reports she has had some burning when she urinates.  Discussed with Dr. Estanislado Pandy, and a UA was ordered.  Will plan on initiation of leflunomide following results of the UA.    Elisabeth Most, Pharm.D., BCPS Clinical Pharmacist Pager: [phone] Phone: [phone] 03/06/2016 12:06 PM

## 2016-03-06 NOTE — Telephone Encounter (Signed)
Received call from nephew [name].  He stated he usually works 7am-7pm and that is the reason he had not returned the ICM call from last week.  He stated patient has had a little increase in SOB over the last few days, no swelling or weight gain.  Advised to send updated ICM remote transmission when he gets to her house this evening and will review tomorrow.  Will get recommendations from Dr [name] tomorrow after receiving transmission.

## 2016-03-06 NOTE — Patient Instructions (Addendum)
Standing Labs We placed an order today for your standing lab work.    Please come back and get your standing labs in 2 weeks after starting Leflunamife, in 2 months then every 2 months.    We have open lab Monday through Friday from 8:30-11:30 AM and 1-4 PM at the office of Dr. Tresa Moore, PA.   The office is located at [address] [place], [place], [place] [address] No appointment is necessary.   Labs are drawn by [facility].  You may receive a bill from [facility] for your lab work.    Leflunomide tablets What is this medicine? LEFLUNOMIDE (le FLOO na mide) is for rheumatoid arthritis. This medicine may be used for other purposes; ask your health care provider or pharmacist if you have questions. COMMON BRAND NAME(S): Arava What should I tell my health care provider before I take this medicine? They need to know if you have any of these conditions: -alcoholism -bone marrow problems -fever or infection -immune system problems -kidney disease -liver disease -an unusual or allergic reaction to leflunomide, teriflunomide, other medicines, lactose, foods, dyes, or preservatives -pregnant or trying to get pregnant -breast-feeding How should I use this medicine? Take this medicine by mouth with a full glass of water. Follow the directions on the prescription label. Take your medicine at regular intervals. Do not take your medicine more often than directed. Do not stop taking except on your doctor's advice. Talk to your pediatrician regarding the use of this medicine in children. Special care may be needed. Overdosage: If you think you have taken too much of this medicine contact a poison control center or emergency room at once. NOTE: This medicine is only for you. Do not share this medicine with others. What if I miss a dose? If you miss a dose, take it as soon as you can. If it is almost time for your next dose, take only that dose. Do not take double or extra  doses. What may interact with this medicine? Do not take this medicine with any of the following medications: -teriflunomide This medicine may also interact with the following medications: -charcoal -cholestyramine -methotrexate -NSAIDs, medicines for pain and inflammation, like ibuprofen or naproxen -phenytoin -rifampin -tolbutamide -vaccines -warfarin This list may not describe all possible interactions. Give your health care provider a list of all the medicines, herbs, non-prescription drugs, or dietary supplements you use. Also tell them if you smoke, drink alcohol, or use illegal drugs. Some items may interact with your medicine. What should I watch for while using this medicine? Visit your doctor or health care professional for regular checks on your progress. You will need frequent blood checks while you are receiving the medicine. If you get a cold or other infection while receiving this medicine, call your doctor or health care professional. Do not treat yourself. The medicine may increase your risk of getting an infection. If you are a woman who has the potential to become pregnant, discuss birth control options with your doctor or health care professional. Dennis Bast must not be pregnant, and you must be using a reliable form of birth control. The medicine may harm an unborn baby. Immediately call your doctor if you think you might be pregnant. Alcoholic drinks may increase possible damage to your liver. Do not drink alcohol while taking this medicine. What side effects may I notice from receiving this medicine? Side effects that you should report to your doctor or health care professional as soon as possible: -allergic  reactions like skin rash, itching or hives, swelling of the face, lips, or tongue -cough -difficulty breathing or shortness of breath -fever, chills or any other sign of infection -redness, blistering, peeling or loosening of the skin, including inside the  mouth -unusual bleeding or bruising -unusually weak or tired -vomiting -yellowing of eyes or skin Side effects that usually do not require medical attention (report to your doctor or health care professional if they continue or are bothersome): -diarrhea -hair loss -headache -nausea This list may not describe all possible side effects. Call your doctor for medical advice about side effects. You may report side effects to FDA at 1-800-FDA-1088. Where should I keep my medicine? Keep out of the reach of children. Store at room temperature between 15 and 30 degrees C (59 and 86 degrees F). Protect from moisture and light. Throw away any unused medicine after the expiration date. NOTE: This sheet is a summary. It may not cover all possible information. If you have questions about this medicine, talk to your doctor, pharmacist, or health care provider.  2017 Elsevier/Gold Standard (2013-04-07 10:53:11)

## 2016-03-07 ENCOUNTER — Telehealth: Payer: Self-pay

## 2016-03-07 ENCOUNTER — Ambulatory Visit (INDEPENDENT_AMBULATORY_CARE_PROVIDER_SITE_OTHER): Payer: Medicare Other

## 2016-03-07 ENCOUNTER — Telehealth: Payer: Self-pay | Admitting: Radiology

## 2016-03-07 DIAGNOSIS — I5022 Chronic systolic (congestive) heart failure: Secondary | ICD-10-CM

## 2016-03-07 DIAGNOSIS — Z95810 Presence of automatic (implantable) cardiac defibrillator: Secondary | ICD-10-CM

## 2016-03-07 LAB — URINALYSIS, MICROSCOPIC ONLY
CASTS: NONE SEEN [LPF]
CRYSTALS: NONE SEEN [HPF]
WBC, UA: >60 WBC/HPF — AB (ref <=5)
Yeast: NONE SEEN [HPF]

## 2016-03-07 LAB — URINALYSIS, ROUTINE W REFLEX MICROSCOPIC
Bilirubin Urine: NEGATIVE
Glucose, UA: NEGATIVE
Hgb urine dipstick: NEGATIVE
KETONES UR: NEGATIVE
NITRITE: NEGATIVE
PH: 5 (ref 5.0–8.0)
Protein, ur: NEGATIVE
SPECIFIC GRAVITY, URINE: 1.017 (ref 1.001–1.035)

## 2016-03-07 NOTE — Progress Notes (Signed)
Patient should see PCP please some fax results to PCP. She will need repeat UA before starting her [place]

## 2016-03-07 NOTE — Telephone Encounter (Signed)
Left message for [name] to call me back

## 2016-03-07 NOTE — Telephone Encounter (Signed)
-----   Message from Bo Merino, MD sent at 03/07/2016  1:08 PM EST ----- Patient should see PCP please some fax results to PCP. She will need repeat UA before starting her [place]

## 2016-03-07 NOTE — Progress Notes (Signed)
EPIC Encounter for ICM Monitoring  Patient Name: [name] is a 75 y.o. female Date: 03/07/2016 Primary Care Physican: [name], MD Primary [place] Electrophysiologist: Lovena Le Dry Weight:unknown Bi-V Pacing: 93.7%      Spoke with nephew on 03/06/2016 and patient reported increase in SOB on activities.  Denied swelling or weight gain.   Thoracic impedance abnormal suggesting fluid accumulation.  Patient taking Furosemide 2 tablets bid and Potassium 10 meq 1 tablet bid    LABS:  Labs drawn on 03/06/2016 but results have not returned.  01/31/2016 Creatinine 1.31, BUN 34, Potassium 4.5, Sodium 137 GFR 38-44 01/11/2016 Creatinine 1.48, BUN 38, Potassium 4.1, Sodium 135 GFR 33-38 12/20/2015 Creatinine 1.77, BUN 40, Potassium 4.0, Sodium 140, GFR 27-31 12/02/2015 Creatinine 1.38, BUN 28, Potassium 4.0, Sodium 136, GFR 36-42 11/29/2015 Creatinine 1.68, BUN 31, Potassium 4.1, Sodium 137, GFR 28-33 11/02/2015 Creatinine 1.73, BUN 41, Potassium 4.5, Sodium 137, GFR 27-32 10/21/2015 Creatinine 2.01, BUN 35, Potassium 5.0, Sodium 136, GFR 23-26 10/12/2015 Creatinine 1.61, BUN 30, Potassium 4.0, Sodium 138, GFR 30-34 10/06/2015 Creatinine 1.45, BUN 33, Potassium 3.7, Sodium 138, GFR 34-39 09/30/2015 Creatinine 1.55, BUN 37, Potassium 4.2, Sodium 139, GFR 31-36 09/21/2015 Creatinine 1.72, BUN 49, Potassium 3.7, Sodium 138, GFR 27-32  Recommendations:  Copy of ICM check sent to Dr [name] and Dr Lovena Le for review and any recommendations.    Follow-up plan: ICM clinic phone appointment on 03/12/2016 to recheck fluid levels.    ICM trend: 03/07/2016       [name], RN 03/07/2016 9:05 AM

## 2016-03-07 NOTE — Telephone Encounter (Signed)
Pt called in to confirm this office got her device reading.  Please give her a call back to confirm.

## 2016-03-08 NOTE — Progress Notes (Signed)
If taking 80 mg BID, would have her take 2.5 mg metolazone for 2 days.

## 2016-03-08 NOTE — Progress Notes (Signed)
Call to nephew [name] and requested a return call regarding Dr [name] new recommendation.  Left ICM direct number

## 2016-03-08 NOTE — Progress Notes (Signed)
Spoke with [name], nephew.  He confirmed patient is taking Furosemide 80 mg bid but would rather double check the dosage at patients house tonight when he gets off work.  Explained Dr Court Joy recommendation of ordering Metolazone 2.5 mg 1 tablet x 2 days only and should be taken 1/2 hour before the Furosemide.  He will leave a message tonight confirming current Furosemide dosage.  Advised will order Metolazone in am.  Advised if patient has any dizziness or weakness after taking 1st dosage of Metolazone 2.5 mg then he can hold the 2nd dosage if needed.  He verbalized understanding of directions and will confirm Furosemide dosage tonight.

## 2016-03-09 MED ORDER — METOLAZONE 2.5 MG PO TABS
ORAL_TABLET | ORAL | 0 refills | Status: DC
Start: 2016-03-09 — End: 2016-08-10

## 2016-03-09 NOTE — Telephone Encounter (Signed)
Robert returned Amy's phone call. Please call back.

## 2016-03-09 NOTE — Progress Notes (Signed)
Spoke with nephew [name]. He reported patient has not been taking Furosemide 80 mg bid.  She has been taking Furosemide 40 mg 1 tablet bid even though the prescription bottle reads Furosemide 40 mg 2 tablets (80 mg total) bid.    Advised would inform Dr [name] of discrepancy before placing script for Metolazone.  Explained I would ask Dr [name] for permanent Furosemide dosage she should be taking daily and if he wants to increase Furosemide temporarily for current fluid accumulation or if should proceed with placing order for Metolazone.  Advised would call him back with recommendation.

## 2016-03-09 NOTE — Progress Notes (Signed)
Start metolazone 2.5 mg daily x 2 days and reassess.

## 2016-03-09 NOTE — Progress Notes (Signed)
Call to [facility] and advised of Dr Court Joy recommendation to take Metolazone 2.5 mg 1 tablet daily x 2 days.  Advised to take 30 minutes prior to taking Furosemide.  Advised to take Furosemide as prescribed 40 mg bid until reassessed on 03/12/2016. He verbalized understanding.

## 2016-03-12 ENCOUNTER — Telehealth: Payer: Self-pay

## 2016-03-12 ENCOUNTER — Ambulatory Visit (INDEPENDENT_AMBULATORY_CARE_PROVIDER_SITE_OTHER): Payer: Medicare Other

## 2016-03-12 DIAGNOSIS — Z95810 Presence of automatic (implantable) cardiac defibrillator: Secondary | ICD-10-CM

## 2016-03-12 DIAGNOSIS — I5022 Chronic systolic (congestive) heart failure: Secondary | ICD-10-CM

## 2016-03-12 NOTE — Telephone Encounter (Signed)
I have discussed with [name], made sure he knows for her to hold [place] until after the UTI is treated. [name] will let me know when the UTI is treated, so we can send in the [place]

## 2016-03-12 NOTE — Progress Notes (Signed)
EPIC Encounter for ICM Monitoring  Patient Name: [name] is a 75 y.o. female Date: 03/12/2016 Primary Care Physican: [name], MD Primary [place] Electrophysiologist: Lovena Le Dry Weight:unknown Bi-V Pacing: 89.6%                                          Attempted ICM call to nephew [name] and unable to reach. Left detailed message regarding transmission.  Transmission reviewed.    Thoracic impedance returned to normal after taking 2 days of Metolazone as ordered.    LABS:  Labs drawn on 03/06/2016 but results have not returned.  01/31/2016 Creatinine 1.31, BUN 34, Potassium 4.5, Sodium 137 GFR 38-44 01/11/2016 Creatinine 1.48, BUN 38, Potassium 4.1, Sodium 135 GFR 33-38 12/20/2015 Creatinine 1.77, BUN 40, Potassium 4.0, Sodium 140, GFR 27-31 12/02/2015 Creatinine 1.38, BUN 28, Potassium 4.0, Sodium 136, GFR 36-42 11/29/2015 Creatinine 1.68, BUN 31, Potassium 4.1, Sodium 137, GFR 28-33 11/02/2015 Creatinine 1.73, BUN 41, Potassium 4.5, Sodium 137, GFR 27-32 10/21/2015 Creatinine 2.01, BUN 35, Potassium 5.0, Sodium 136, GFR 23-26 10/12/2015 Creatinine 1.61, BUN 30, Potassium 4.0, Sodium 138, GFR 30-34 10/06/2015 Creatinine 1.45, BUN 33, Potassium 3.7, Sodium 138, GFR 34-39 09/30/2015 Creatinine 1.55, BUN 37, Potassium 4.2, Sodium 139, GFR 31-36 09/21/2015 Creatinine 1.72, BUN 49, Potassium 3.7, Sodium 138, GFR 27-32  Recommendations:  Sent copy to Dr [name] for reassessment after taking Metolazone and if patient should continue to take Furosemide 40 mg 1 tablet twice a day  Follow-up plan: ICM clinic phone appointment on 03/29/2016.  Copy of ICM check sent to primary cardiologist and device physician.   ICM trend: 03/12/2016       [name], RN 03/12/2016 1:51 PM

## 2016-03-12 NOTE — Telephone Encounter (Signed)
Remote ICM transmission received.  Attempted patient call and left message to return call.   

## 2016-03-13 ENCOUNTER — Other Ambulatory Visit (HOSPITAL_COMMUNITY): Payer: Medicare Other

## 2016-03-13 ENCOUNTER — Ambulatory Visit (HOSPITAL_COMMUNITY): Payer: Medicare Other | Admitting: Hematology & Oncology

## 2016-03-13 MED ORDER — FUROSEMIDE 40 MG PO TABS: ORAL_TABLET | ORAL | 3 refills | Status: DC

## 2016-03-13 NOTE — Progress Notes (Signed)
Yes, continue 40 mg bid. Can take an extra 40 mg for weight gain of 3 lbs in 24 hrs.

## 2016-03-13 NOTE — Progress Notes (Signed)
Call to nephew [name] and left detailed message on phone that patient should take Furosemide 40 mg 1 tablet twice daily.  Can take an extra 40 mg for weight gain of 3 lbs in 24 hrs.  Advised to call back with any questions.  Provided direct ICM number.

## 2016-03-13 NOTE — Addendum Note (Signed)
Addended by: [name] on: 03/13/2016 04:24 PM   Modules accepted: Orders

## 2016-03-19 ENCOUNTER — Telehealth: Payer: Self-pay | Admitting: Radiology

## 2016-03-19 NOTE — Progress Notes (Signed)
This encounter was created in error - please disregard.

## 2016-03-19 NOTE — Telephone Encounter (Signed)
[name] called me / the abnormal UA was not received by Dr Legrand Rams office, I have refaxed it

## 2016-03-21 ENCOUNTER — Ambulatory Visit (HOSPITAL_COMMUNITY): Payer: Medicare Other | Admitting: Hematology & Oncology

## 2016-03-22 ENCOUNTER — Other Ambulatory Visit (HOSPITAL_COMMUNITY): Payer: Medicare Other

## 2016-03-28 ENCOUNTER — Other Ambulatory Visit (HOSPITAL_COMMUNITY): Payer: Self-pay

## 2016-03-28 DIAGNOSIS — D638 Anemia in other chronic diseases classified elsewhere: Secondary | ICD-10-CM

## 2016-03-29 ENCOUNTER — Other Ambulatory Visit (HOSPITAL_COMMUNITY): Payer: Medicare Other

## 2016-03-29 ENCOUNTER — Ambulatory Visit (INDEPENDENT_AMBULATORY_CARE_PROVIDER_SITE_OTHER): Payer: Medicare Other

## 2016-03-29 ENCOUNTER — Telehealth: Payer: Self-pay

## 2016-03-29 DIAGNOSIS — Z95810 Presence of automatic (implantable) cardiac defibrillator: Secondary | ICD-10-CM

## 2016-03-29 DIAGNOSIS — I5022 Chronic systolic (congestive) heart failure: Secondary | ICD-10-CM

## 2016-03-29 NOTE — Telephone Encounter (Signed)
Remote ICM transmission received.  Attempted call to nephew, [name], and left detailed message regarding transmission and next ICM scheduled for 05/01/2015.  Advised to return call for any fluid symptoms or questions.

## 2016-03-29 NOTE — Progress Notes (Signed)
EPIC Encounter for ICM Monitoring  Patient Name: [name] is a 75 y.o. female Date: 03/29/2016 Primary Care Physican: [name], MD Primary [place] Electrophysiologist: Lovena Le Dry Weight:unknown Bi-V Pacing: 92.4%      Attempted ICM call to nephew, [name], and unable to reach. Left detailed message regarding transmission.  Transmission reviewed.    Thoracic impedance normal   LABS: Patient did not have labs drawn on 03/06/2016 as ordered  01/31/2016 Creatinine 1.31, BUN 34, Potassium 4.5, Sodium 137 GFR 38-44 01/11/2016 Creatinine 1.48, BUN 38, Potassium 4.1, Sodium 135 GFR 33-38 12/20/2015 Creatinine 1.77, BUN 40, Potassium 4.0, Sodium 140, GFR 27-31 12/02/2015 Creatinine 1.38, BUN 28, Potassium 4.0, Sodium 136, GFR 36-42 11/29/2015 Creatinine 1.68, BUN 31, Potassium 4.1, Sodium 137, GFR 28-33 11/02/2015 Creatinine 1.73, BUN 41, Potassium 4.5, Sodium 137, GFR 27-32 10/21/2015 Creatinine 2.01, BUN 35, Potassium 5.0, Sodium 136, GFR 23-26 10/12/2015 Creatinine 1.61, BUN 30, Potassium 4.0, Sodium 138, GFR 30-34 10/06/2015 Creatinine 1.45, BUN 33, Potassium 3.7, Sodium 138, GFR 34-39 09/30/2015 Creatinine 1.55, BUN 37, Potassium 4.2, Sodium 139, GFR 31-36 09/21/2015 Creatinine 1.72, BUN 49, Potassium 3.7, Sodium 138, GFR 27-32  Recommendations: N/A.    Follow-up plan: ICM clinic phone appointment on 04/30/2016.  Copy of ICM check sent to device physician.   ICM trend: 03/29/2016       [name], RN 03/29/2016 2:37 PM

## 2016-04-26 ENCOUNTER — Other Ambulatory Visit: Payer: Self-pay | Admitting: *Deleted

## 2016-04-26 DIAGNOSIS — M0579 Rheumatoid arthritis with rheumatoid factor of multiple sites without organ or systems involvement: Secondary | ICD-10-CM

## 2016-04-26 MED ORDER — HYDROXYCHLOROQUINE SULFATE 200 MG PO TABS: 200.0000 mg | ORAL_TABLET | Freq: Every day | ORAL | 0 refills | Status: DC

## 2016-04-26 NOTE — Telephone Encounter (Signed)
Refill request received via fax for PLQ  Last Visit: 03/06/16 Next Visit: 06/06/16 Labs: 01/11/16 CBC hemoglobin 7.8, CMP creatinine 1.77, GFR 31, uric acid 11.7 PLQ Eye Exam: 02/2015 WNL Contacted patient's nephew regarding PLQ eye exam.   Okay to refill PLQ?

## 2016-04-26 NOTE — Telephone Encounter (Signed)
ok 

## 2016-04-30 ENCOUNTER — Ambulatory Visit (INDEPENDENT_AMBULATORY_CARE_PROVIDER_SITE_OTHER): Payer: Self-pay | Admitting: *Deleted

## 2016-04-30 DIAGNOSIS — I5022 Chronic systolic (congestive) heart failure: Secondary | ICD-10-CM

## 2016-04-30 DIAGNOSIS — Z95810 Presence of automatic (implantable) cardiac defibrillator: Secondary | ICD-10-CM

## 2016-04-30 DIAGNOSIS — I255 Ischemic cardiomyopathy: Secondary | ICD-10-CM

## 2016-05-01 NOTE — Progress Notes (Signed)
Remote ICD transmission.   

## 2016-05-01 NOTE — Progress Notes (Signed)
EPIC Encounter for ICM Monitoring  Patient Name: [name] is a 76 y.o. female Date: 05/01/2016 Primary Care Physican: [name], MD Primary [place] Electrophysiologist: Lovena Le Dry Weight:unknown Bi-V Pacing: 90.8%      Spoke with nephew [name].  Heart Failure questions reviewed, pt asymptomatic   Thoracic impedance abnormal suggesting fluid accumulation.  LABS: Patient did not have labs drawn on 03/06/2016 as ordered  01/31/2016 Creatinine 1.31, BUN 34, Potassium 4.5, Sodium 137 GFR 38-44 01/11/2016 Creatinine 1.48, BUN 38, Potassium 4.1, Sodium 135 GFR 33-38 12/20/2015 Creatinine 1.77, BUN 40, Potassium 4.0, Sodium 140, GFR 27-31 12/02/2015 Creatinine 1.38, BUN 28, Potassium 4.0, Sodium 136, GFR 36-42 11/29/2015 Creatinine 1.68, BUN 31, Potassium 4.1, Sodium 137, GFR 28-33 11/02/2015 Creatinine 1.73, BUN 41, Potassium 4.5, Sodium 137, GFR 27-32 10/21/2015 Creatinine 2.01, BUN 35, Potassium 5.0, Sodium 136, GFR 23-26 10/12/2015 Creatinine 1.61, BUN 30, Potassium 4.0, Sodium 138, GFR 30-34 10/06/2015 Creatinine 1.45, BUN 33, Potassium 3.7, Sodium 138, GFR 34-39 09/30/2015 Creatinine 1.55, BUN 37, Potassium 4.2, Sodium 139, GFR 31-36 09/21/2015 Creatinine 1.72, BUN 49, Potassium 3.7, Sodium 138, GFR 27-32  Recommendations:  Increase Furosemide 40 mg to 2 tablets every am and 1 tablet pm x 2 days and Potassium 10 meq 1 tablet twice a day x 2 days.   After 2nd day return to prior dosages of Furosemide 40 mg 1 tablet twice a day and Potassium 10 meq 1 tablet daily.  He verbalized understanding.   Prescription reads that patient make take extra Furosemide 40 mg when needed.     Follow-up plan: ICM clinic phone appointment on 05/04/2016.  Copy of ICM check sent to primary cardiologist and device physician.   3 month ICM trend : 04/30/2016   1 Year ICM trend:      [name], RN 05/01/2016 3:09 PM

## 2016-05-02 ENCOUNTER — Encounter: Payer: Self-pay | Admitting: Cardiology

## 2016-05-03 LAB — CUP PACEART REMOTE DEVICE CHECK
Brady Statistic AP VP Percent: 9.32 %
Brady Statistic AP VS Percent: 0.71 %
Brady Statistic AS VP Percent: 88.02 %
Brady Statistic AS VS Percent: 1.95 %
Brady Statistic RV Percent Paced: 3.39 %
Date Time Interrogation Session: 20180108093827
HighPow Impedance: 79 Ohm
Implantable Lead Implant Date: 20140407
Implantable Lead Implant Date: 20140407
Implantable Lead Location: 753858
Implantable Lead Model: 6935
Implantable Pulse Generator Implant Date: 20140407
Lead Channel Impedance Value: 323 Ohm
Lead Channel Impedance Value: 342 Ohm
Lead Channel Impedance Value: 456 Ohm
Lead Channel Pacing Threshold Amplitude: 0.625 V
Lead Channel Pacing Threshold Pulse Width: 0.4 ms
Lead Channel Pacing Threshold Pulse Width: 0.5 ms
Lead Channel Sensing Intrinsic Amplitude: 22.75 mV
Lead Channel Setting Pacing Amplitude: 1.75 V
Lead Channel Setting Pacing Amplitude: 2 V
Lead Channel Setting Pacing Pulse Width: 0.4 ms
Lead Channel Setting Pacing Pulse Width: 0.5 ms
MDC IDC LEAD IMPLANT DT: 20140407
MDC IDC LEAD LOCATION: 753859
MDC IDC LEAD LOCATION: 753860
MDC IDC MSMT BATTERY REMAINING LONGEVITY: 67 mo
MDC IDC MSMT BATTERY VOLTAGE: 2.98 V
MDC IDC MSMT LEADCHNL LV IMPEDANCE VALUE: 589 Ohm
MDC IDC MSMT LEADCHNL LV IMPEDANCE VALUE: 817 Ohm
MDC IDC MSMT LEADCHNL RA IMPEDANCE VALUE: 323 Ohm
MDC IDC MSMT LEADCHNL RA PACING THRESHOLD AMPLITUDE: 0.625 V
MDC IDC MSMT LEADCHNL RA SENSING INTR AMPL: 3.5 mV
MDC IDC MSMT LEADCHNL RA SENSING INTR AMPL: 3.5 mV
MDC IDC MSMT LEADCHNL RV PACING THRESHOLD AMPLITUDE: 1.125 V
MDC IDC MSMT LEADCHNL RV PACING THRESHOLD PULSEWIDTH: 0.4 ms
MDC IDC MSMT LEADCHNL RV SENSING INTR AMPL: 22.75 mV
MDC IDC SET LEADCHNL RV PACING AMPLITUDE: 2.5 V
MDC IDC SET LEADCHNL RV SENSING SENSITIVITY: 0.3 mV
MDC IDC STAT BRADY RA PERCENT PACED: 9.87 %

## 2016-05-04 ENCOUNTER — Telehealth: Payer: Self-pay

## 2016-05-04 ENCOUNTER — Ambulatory Visit (INDEPENDENT_AMBULATORY_CARE_PROVIDER_SITE_OTHER): Payer: Self-pay

## 2016-05-04 DIAGNOSIS — I5022 Chronic systolic (congestive) heart failure: Secondary | ICD-10-CM

## 2016-05-04 DIAGNOSIS — Z95810 Presence of automatic (implantable) cardiac defibrillator: Secondary | ICD-10-CM

## 2016-05-04 NOTE — Progress Notes (Signed)
EPIC Encounter for ICM Monitoring  Patient Name: [name] is a 76 y.o. female Date: 05/04/2016 Primary Care Physican: [name], MD Primary [place] Electrophysiologist: Lovena Le Dry Weight:unknown Bi-V Pacing: 89.2%                                            Attempted call to nephew [name]. Left detailed message regarding transmission.  Transmission reviewed  Thoracic impedance returned to normal after 2 days of increased furosemide.  LABS: Patient did not have labs drawn on 03/06/2016 as ordered 01/31/2016 Creatinine 1.31, BUN 34, Potassium 4.5, Sodium 137 GFR 38-44 01/11/2016 Creatinine 1.48, BUN 38, Potassium 4.1, Sodium 135 GFR 33-38 12/20/2015 Creatinine 1.77, BUN 40, Potassium 4.0, Sodium 140, GFR 27-31 12/02/2015 Creatinine 1.38, BUN 28, Potassium 4.0, Sodium 136, GFR 36-42 11/29/2015 Creatinine 1.68, BUN 31, Potassium 4.1, Sodium 137, GFR 28-33 11/02/2015 Creatinine 1.73, BUN 41, Potassium 4.5, Sodium 137, GFR 27-32 10/21/2015 Creatinine 2.01, BUN 35, Potassium 5.0, Sodium 136, GFR 23-26 10/12/2015 Creatinine 1.61, BUN 30, Potassium 4.0, Sodium 138, GFR 30-34 10/06/2015 Creatinine 1.45, BUN 33, Potassium 3.7, Sodium 138, GFR 34-39 09/30/2015 Creatinine 1.55, BUN 37, Potassium 4.2, Sodium 139, GFR 31-36 09/21/2015 Creatinine 1.72, BUN 49, Potassium 3.7, Sodium 138, GFR 27-32  Recommendations:   Encouraged to call for fluid symptoms.  Follow-up plan: ICM clinic phone appointment on 06/01/16.  Copy of ICM check sent to primary cardiologist and device physician.   3 month ICM trend: 05/04/2016   1 Year ICM trend:      [name], RN 05/04/2016 8:11 AM

## 2016-05-04 NOTE — Telephone Encounter (Signed)
Remote ICM transmission received.  Attempted call to nephew, [name] and left detailed message regarding transmission and next ICM scheduled for 2/9.  Advised to return call for any fluid symptoms or questions.

## 2016-05-15 ENCOUNTER — Telehealth: Payer: Self-pay | Admitting: Rheumatology

## 2016-05-15 NOTE — Telephone Encounter (Signed)
Patient's nephew advised if [name] has had her PLQ eye exam. He states that she has but is unsure of the date. [name] states he will call her eye doctor and have them fax the results of the last eye visit.

## 2016-05-15 NOTE — Telephone Encounter (Signed)
Patient's nephew returned Andrea's phone call  From last week.

## 2016-05-17 ENCOUNTER — Telehealth: Payer: Self-pay | Admitting: Pharmacist

## 2016-05-17 NOTE — Telephone Encounter (Signed)
ok 

## 2016-05-17 NOTE — Telephone Encounter (Signed)
Patient last seen on 03/06/16.  Plan at that time was to start leflunomide 10 mg daily after getting a repeat UA from patient's PCP.  Noted Dr. Josephine Cables office sent [place] UA results and cleared her to start the medication.   Most recent labs were on 01/31/16 at which time CMP with Cr 1.31, BUM 34, GFR 44 (consistent with previous results), CBC with Hgb 7.6, Hct 24.6 (consistent with previous results).    Okay to start leflunomide at this time?  I will call patient to review medication with her again before sending in prescription.

## 2016-05-18 MED ORDER — LEFLUNOMIDE 10 MG PO TABS: 10.0000 mg | ORAL_TABLET | Freq: Every day | ORAL | 0 refills | Status: DC

## 2016-05-18 NOTE — Telephone Encounter (Signed)
I called patient who is very hard of hearing.  She asked me to speak to her son [name].  I reviewed that we are going to send in a prescription for leflunomide 10 mg daily to patient's pharmacy ([facility]).  I counseled the patient about this medication in November and she signed consent.  Patient confirms she remembers talking about the medication.  Reviewed that patient will need lab work two weeks after starting the medication.  I was also asked to call patient's nephew, [name], who brings patient to her appointments to review lab instructions.  I tried to call him and left a message asking him to call me back.

## 2016-05-18 NOTE — Telephone Encounter (Signed)
I spoke to patient's nephew [name] who is the primary caregiver for the patient.  I reviewed with him that we are sending in a prescription for leflunomide 10 mg daily.  Advised that patient will need labs two weeks after starting the medication.  Robert voiced understanding.  He reports neither he nor Mrs. Mira has any questions regarding patient's medication.  Patient will be seen for follow up on 06/06/16.     Elisabeth Most, Pharm.D., BCPS, CPP Clinical Pharmacist Pager: [phone] Phone: [phone] 05/18/2016 1:18 PM

## 2016-05-28 ENCOUNTER — Telehealth: Payer: Self-pay | Admitting: Rheumatology

## 2016-05-28 NOTE — Telephone Encounter (Signed)
Patient will be going to get lab work done within the next week. Please release orders. Patient goes to the lab at [facility].

## 2016-05-29 ENCOUNTER — Other Ambulatory Visit: Payer: Self-pay | Admitting: *Deleted

## 2016-05-29 DIAGNOSIS — D509 Iron deficiency anemia, unspecified: Secondary | ICD-10-CM

## 2016-05-29 DIAGNOSIS — D638 Anemia in other chronic diseases classified elsewhere: Secondary | ICD-10-CM

## 2016-05-29 DIAGNOSIS — Z79899 Other long term (current) drug therapy: Secondary | ICD-10-CM

## 2016-05-29 NOTE — Telephone Encounter (Signed)
Labs are in Epic and have been released.

## 2016-05-30 ENCOUNTER — Other Ambulatory Visit: Payer: Self-pay | Admitting: Rheumatology

## 2016-05-30 DIAGNOSIS — M0579 Rheumatoid arthritis with rheumatoid factor of multiple sites without organ or systems involvement: Secondary | ICD-10-CM

## 2016-05-30 NOTE — Telephone Encounter (Signed)
Patient needs a refill of her hydrochloroquine. Patient has changed insurance company's and she is now with [employer]. Please send rx to [facility]

## 2016-05-31 MED ORDER — HYDROXYCHLOROQUINE SULFATE 200 MG PO TABS: 200.0000 mg | ORAL_TABLET | Freq: Every day | ORAL | 0 refills | Status: DC

## 2016-05-31 NOTE — Telephone Encounter (Signed)
Prescription was approved for refills in January. Sent prescription sent to the required pharmacy by patient's new insurance.

## 2016-06-01 ENCOUNTER — Other Ambulatory Visit (HOSPITAL_COMMUNITY): Payer: Self-pay | Admitting: Oncology

## 2016-06-01 ENCOUNTER — Telehealth: Payer: Self-pay | Admitting: Radiology

## 2016-06-01 ENCOUNTER — Telehealth: Payer: Self-pay

## 2016-06-01 ENCOUNTER — Ambulatory Visit (INDEPENDENT_AMBULATORY_CARE_PROVIDER_SITE_OTHER): Payer: Medicare HMO

## 2016-06-01 ENCOUNTER — Other Ambulatory Visit (HOSPITAL_COMMUNITY)
Admission: RE | Admit: 2016-06-01 | Discharge: 2016-06-01 | Disposition: A | Discharge status: Home / Self Care | Payer: Medicare HMO | Source: Ambulatory Visit | Attending: Rheumatology | Admitting: Rheumatology

## 2016-06-01 DIAGNOSIS — Z5181 Encounter for therapeutic drug level monitoring: Secondary | ICD-10-CM | POA: Insufficient documentation

## 2016-06-01 DIAGNOSIS — I5022 Chronic systolic (congestive) heart failure: Secondary | ICD-10-CM

## 2016-06-01 DIAGNOSIS — D638 Anemia in other chronic diseases classified elsewhere: Secondary | ICD-10-CM

## 2016-06-01 DIAGNOSIS — Z79899 Other long term (current) drug therapy: Secondary | ICD-10-CM | POA: Diagnosis not present

## 2016-06-01 DIAGNOSIS — Z95810 Presence of automatic (implantable) cardiac defibrillator: Secondary | ICD-10-CM

## 2016-06-01 LAB — CBC WITH DIFFERENTIAL/PLATELET
BASOS ABS: 0.1 10*3/uL (ref 0.0–0.1)
Basophils Relative: 1 %
EOS ABS: 0.2 10*3/uL (ref 0.0–0.7)
Eosinophils Relative: 3 %
HCT: 21.6 % — ABNORMAL LOW (ref 36.0–46.0)
Hemoglobin: 6.5 g/dL — CL (ref 12.0–15.0)
LYMPHS ABS: 1.6 10*3/uL (ref 0.7–4.0)
Lymphocytes Relative: 23 %
MCH: 21.5 pg — ABNORMAL LOW (ref 26.0–34.0)
MCHC: 30.1 g/dL (ref 30.0–36.0)
MCV: 71.5 fL — ABNORMAL LOW (ref 78.0–100.0)
MONO ABS: 0.6 10*3/uL (ref 0.1–1.0)
Monocytes Relative: 9 %
NEUTROS ABS: 4.4 10*3/uL (ref 1.7–7.7)
Neutrophils Relative %: 64 %
PLATELETS: 292 10*3/uL (ref 150–400)
RBC: 3.02 MIL/uL — ABNORMAL LOW (ref 3.87–5.11)
RDW: 19.2 % — AB (ref 11.5–15.5)
WBC: 6.9 10*3/uL (ref 4.0–10.5)

## 2016-06-01 LAB — COMPREHENSIVE METABOLIC PANEL
ALBUMIN: 3 g/dL — AB (ref 3.5–5.0)
ALT: 8 U/L — ABNORMAL LOW (ref 14–54)
ANION GAP: 7 (ref 5–15)
AST: 15 U/L (ref 15–41)
Alkaline Phosphatase: 90 U/L (ref 38–126)
BILIRUBIN TOTAL: 0.4 mg/dL (ref 0.3–1.2)
BUN: 43 mg/dL — ABNORMAL HIGH (ref 6–20)
CO2: 25 mmol/L (ref 22–32)
Calcium: 8.6 mg/dL — ABNORMAL LOW (ref 8.9–10.3)
Chloride: 100 mmol/L — ABNORMAL LOW (ref 101–111)
Creatinine, Ser: 1.68 mg/dL — ABNORMAL HIGH (ref 0.44–1.00)
GFR calc Af Amer: 33 mL/min — ABNORMAL LOW (ref >60)
GFR calc non Af Amer: 28 mL/min — ABNORMAL LOW (ref >60)
GLUCOSE: 120 mg/dL — AB (ref 65–99)
POTASSIUM: 4.7 mmol/L (ref 3.5–5.1)
SODIUM: 132 mmol/L — AB (ref 135–145)
TOTAL PROTEIN: 8.3 g/dL — AB (ref 6.5–8.1)

## 2016-06-01 NOTE — Telephone Encounter (Signed)
Remote ICM transmission received.  Attempted patient call and left detailed message regarding transmission and next ICM scheduled for 07/02/2016.  Advised to return call for any fluid symptoms or questions.

## 2016-06-01 NOTE — Telephone Encounter (Signed)
I spoke to Kirby Crigler PA C with [facility] today, regarding patients critical low Hemoglobin. He states he will get patient in and take care of her. Mr Carlyon Shadow has been advised.

## 2016-06-01 NOTE — Progress Notes (Signed)
EPIC Encounter for ICM Monitoring  Patient Name: [name] is a 76 y.o. female Date: 06/01/2016 Primary Care Physican: [name], MD Primary [place] Electrophysiologist: Druscilla Brownie Weight:unknown Bi-V Pacing: 89.4%      Attempted call to patient and unable to reach.  Left detailed message regarding transmission.  Transmission reviewed.   Thoracic impedance normal   LABS:  01/31/2016 Creatinine 1.31, BUN 34, Potassium 4.5, Sodium 137 GFR 38-44 01/11/2016 Creatinine 1.48, BUN 38, Potassium 4.1, Sodium 135 GFR 33-38 12/20/2015 Creatinine 1.77, BUN 40, Potassium 4.0, Sodium 140, GFR 27-31 12/02/2015 Creatinine 1.38, BUN 28, Potassium 4.0, Sodium 136, GFR 36-42 11/29/2015 Creatinine 1.68, BUN 31, Potassium 4.1, Sodium 137, GFR 28-33 11/02/2015 Creatinine 1.73, BUN 41, Potassium 4.5, Sodium 137, GFR 27-32 10/21/2015 Creatinine 2.01, BUN 35, Potassium 5.0, Sodium 136, GFR 23-26 10/12/2015 Creatinine 1.61, BUN 30, Potassium 4.0, Sodium 138, GFR 30-34 10/06/2015 Creatinine 1.45, BUN 33, Potassium 3.7, Sodium 138, GFR 34-39 09/30/2015 Creatinine 1.55, BUN 37, Potassium 4.2, Sodium 139, GFR 31-36 09/21/2015 Creatinine 1.72, BUN 49, Potassium 3.7, Sodium 138, GFR 27-32  Recommendations: Left voice mail with ICM number and encouraged to call for fluid symptoms.  Follow-up plan: ICM clinic phone appointment on 07/02/2016.  Copy of ICM check sent to device physician.   3 month ICM trend: 06/01/2016   1 Year ICM trend:      [name], RN 06/01/2016 8:19 AM

## 2016-06-04 ENCOUNTER — Encounter (HOSPITAL_COMMUNITY): Payer: Medicare HMO

## 2016-06-04 ENCOUNTER — Encounter (HOSPITAL_COMMUNITY): Payer: Medicare HMO | Attending: Adult Health | Admitting: Adult Health

## 2016-06-04 VITALS — BP 121/43 | HR 75 | Temp 98.8°F | Resp 20 | Wt 143.1 lb

## 2016-06-04 DIAGNOSIS — D638 Anemia in other chronic diseases classified elsewhere: Secondary | ICD-10-CM | POA: Diagnosis not present

## 2016-06-04 DIAGNOSIS — D508 Other iron deficiency anemias: Secondary | ICD-10-CM

## 2016-06-04 DIAGNOSIS — D509 Iron deficiency anemia, unspecified: Secondary | ICD-10-CM | POA: Insufficient documentation

## 2016-06-04 DIAGNOSIS — Z7982 Long term (current) use of aspirin: Secondary | ICD-10-CM | POA: Insufficient documentation

## 2016-06-04 DIAGNOSIS — R5383 Other fatigue: Secondary | ICD-10-CM

## 2016-06-04 DIAGNOSIS — Z8679 Personal history of other diseases of the circulatory system: Secondary | ICD-10-CM | POA: Insufficient documentation

## 2016-06-04 DIAGNOSIS — Z87891 Personal history of nicotine dependence: Secondary | ICD-10-CM | POA: Insufficient documentation

## 2016-06-04 DIAGNOSIS — Z87448 Personal history of other diseases of urinary system: Secondary | ICD-10-CM | POA: Insufficient documentation

## 2016-06-04 DIAGNOSIS — Z79899 Other long term (current) drug therapy: Secondary | ICD-10-CM | POA: Insufficient documentation

## 2016-06-04 DIAGNOSIS — Z853 Personal history of malignant neoplasm of breast: Secondary | ICD-10-CM | POA: Insufficient documentation

## 2016-06-04 DIAGNOSIS — D649 Anemia, unspecified: Secondary | ICD-10-CM

## 2016-06-04 LAB — CBC WITH DIFFERENTIAL/PLATELET
BASOS PCT: 1 %
Basophils Absolute: 0.1 10*3/uL (ref 0.0–0.1)
EOS PCT: 3 %
Eosinophils Absolute: 0.2 10*3/uL (ref 0.0–0.7)
HEMATOCRIT: 20.8 % — AB (ref 36.0–46.0)
Hemoglobin: 6.2 g/dL — CL (ref 12.0–15.0)
LYMPHS ABS: 1.1 10*3/uL (ref 0.7–4.0)
Lymphocytes Relative: 17 %
MCH: 21.3 pg — AB (ref 26.0–34.0)
MCHC: 29.8 g/dL — ABNORMAL LOW (ref 30.0–36.0)
MCV: 71.5 fL — AB (ref 78.0–100.0)
MONO ABS: 0.8 10*3/uL (ref 0.1–1.0)
MONOS PCT: 12 %
Neutro Abs: 4.5 10*3/uL (ref 1.7–7.7)
Neutrophils Relative %: 67 %
PLATELETS: 260 10*3/uL (ref 150–400)
RBC: 2.91 MIL/uL — ABNORMAL LOW (ref 3.87–5.11)
RDW: 19.2 % — AB (ref 11.5–15.5)
WBC: 6.7 10*3/uL (ref 4.0–10.5)

## 2016-06-04 LAB — SAMPLE TO BLOOD BANK

## 2016-06-04 LAB — IRON AND TIBC
Iron: 23 ug/dL — ABNORMAL LOW (ref 28–170)
Saturation Ratios: 10 % — ABNORMAL LOW (ref 10.4–31.8)
TIBC: 234 ug/dL — AB (ref 250–450)
UIBC: 211 ug/dL

## 2016-06-04 LAB — VITAMIN B12: Vitamin B-12: 714 pg/mL (ref 180–914)

## 2016-06-04 LAB — FERRITIN: FERRITIN: 41 ng/mL (ref 11–307)

## 2016-06-04 LAB — FOLATE: Folate: 12.4 ng/mL (ref >5.9)

## 2016-06-04 NOTE — Progress Notes (Signed)
Office Visit Note  Patient: [name]             Date of Birth: 1940-09-08           MRN: [id number]             PCP: [name], MD Referring: [name], MD Visit Date: 06/06/2016 Occupation: '@GUAROCC'$ @    Subjective:  Follow-up Follow-up on rheumatoid arthritis and high-risk prescription  History of Present Illness: [name] is a 76 y.o. female  Last seen in our office 11/09/2015.  Patient is accompanied by her nephew , [name]. His cell phone is [phone]. He accompanies the patient and cares for her. Patient is hard of hearing and he assists her with her medication, doctor visits etc.  Patient has a history of rheumatoid arthritis with positive rheumatoid factor and CCP. On her last visit in July, she was having synovitis in bilateral hands. The rheumatoid arthritis was not responding to methotrexate and Plaquenil 2 therapy. In addition, the methotrexate was affecting the patient's kidney and her creatinine and her GFR were abnormal. As a result it was decided to stop the methotrexate and switch her to a Reverdin.  Unfortunately the patient has a history of long-standing anemia. The already for has a history of worsening anemia. This Friday, 06/01/2016, her hemoglobin and hematocrit were slightly worse compared to previous values.  Assessment as we got those critical levels [facility], we called the patient's hematologist oncologist and informed them of our concern. There were kind enough to schedule the patient for Monday, [place] 04/11/2017 for evaluation and treatment. Reviewing the patient's chart shows that she is scheduled for an infusion on 06/08/2016.  In light of the interaction of Arava with hemoglobin/hematocrit levels, and adversely affecting them, we have elected to try giving array of a low dose of 10 mg every other day for at least the next 6 weeks to confirm and make sure that patient is able to tolerate this medication  well. Patient is agreeable to try to this dose. She will also continue Plaquenil 200 mg daily. Patient's nephew, [name] is aware of our treatment plan and is agreeable. He will get a pillbox and lay out the medicine according to the schedule to ensure that there is no confusion.  Currently the patient is doing well overall and nothing has changed for the positive or the negative as it relates to rheumatoid arthritis. She still has ongoing synovitis to her hands/wrist.  She reiterates her concern of having weakness in her hands and pain in her hands as she is trying to do her daily activities of cooking/cleaning/activities of daily living and how it's adversely affecting her. I advised him that I'm also concerned about patient using utensils/denies/heavy pots and pans and having an accident. Once we have her joints feeling better, I advised him that I would like to do physical therapy to strengthen those joints. Because she is hurting right now physical therapy would not be a good option. Patient and nephew are agreeable    Activities of Daily Living:  Patient reports morning stiffness for 60 minutes.   Patient Reports nocturnal pain.  Difficulty dressing/grooming: Reports Difficulty climbing stairs: Reports Difficulty getting out of chair: Reports Difficulty using hands for taps, buttons, cutlery, and/or writing: Reports   Review of Systems  Constitutional: Negative for fatigue.  HENT: Negative for mouth sores and mouth dryness.   Eyes: Negative for dryness.  Respiratory: Negative for shortness of breath.   Gastrointestinal: Negative  for constipation and diarrhea.  Musculoskeletal: Negative for myalgias and myalgias.  Skin: Negative for sensitivity to sunlight.  Psychiatric/Behavioral: Negative for decreased concentration and sleep disturbance.    PMFS History:  Patient Active Problem List   Diagnosis Date Noted  . History of CHF (congestive heart failure) 06/04/2016  .  History of chronic kidney disease 06/04/2016  . Symptomatic anemia 06/04/2016  . Elevated sedimentation rate 03/03/2016  . Chronic gout of foot 03/03/2016  . Total knee replacement status, right 03/03/2016  . High risk medication use 03/03/2016  . Chronic kidney disease (CKD) stage G3b/A1, moderately decreased glomerular filtration rate (GFR) between 30-44 mL/min/1.73 square meter and albuminuria creatinine ratio less than 30 mg/g 02/01/2016  . CAD S/P remote PCI- no details 09/02/2014  . Cardiomyopathy, ischemic-EF 30-35% March 2015 09/02/2014  . Diastolic dysfunction-grade 2 09/02/2014  . CHF exacerbation ([place]) 08/28/2014  . Acute on chronic combined systolic and diastolic congestive heart failure ([place]) 08/28/2014  . Iron deficiency anemia 08/20/2013  . Malnutrition of moderate degree ([place]) 07/21/2013  . PNA (pneumonia) 07/19/2013  . HCAP (healthcare-associated pneumonia) 07/17/2013  . Sepsis ([place]) 07/17/2013  . ARF (acute renal failure) ([place]) 07/17/2013  . Rheumatoid arthritis ([place]) 07/04/2013  . Hypokalemia 07/03/2013  . Chest pain 07/03/2013  . Biventricular ICD in place (MDT 2014) 11/06/2012  . Anemia of chronic disease   . Breast cancer ([place])   . Hypertension   . Dyslipidemia 05/24/2009  . Chronic systolic heart failure ([place]) 05/24/2009    Past Medical History:  Diagnosis Date  . Anemia    Hgb of 9-10  . Anemia of chronic disease    Hgb of 9-10 chronically; 06/2010: H&H-10.7/33.5, MCV-81, normal iron studies in 2010   . Arteriosclerotic cardiovascular disease (ASCVD)    Remote PTCA by patient report; LBBB; associated cardiomyopathy, presumed ischemic with EF 40-45% previously, 20% in 06/2009; h/o clinical congestive heart failure; negative stress nuclear in 2009 with inferoseptal and apical scar  . Automatic implantable cardioverter-defibrillator in situ   . Breast cancer ([place])    breast  . Chronic bronchitis ([place])   . Chronic renal disease, stage 3, moderately decreased  glomerular filtration rate (GFR) between 30-59 mL/min/1.73 square meter 02/01/2016  . Congestive heart failure (CHF) ([place])   . Elevated cholesterol   . Elevated sed rate   . GERD (gastroesophageal reflux disease)   . Gout   . HOH (hard of hearing)   . Hyperlipidemia   . Hypertension   . Rheumatoid arthritis ([place])     Family History  Problem Relation Age of Onset  . Diabetes Father   . Pancreatic cancer Father   . Hypertension Brother    Past Surgical History:  Procedure Laterality Date  . BI-VENTRICULAR IMPLANTABLE CARDIOVERTER DEFIBRILLATOR N/A 07/28/2012   Procedure: BI-VENTRICULAR IMPLANTABLE CARDIOVERTER DEFIBRILLATOR  (CRT-D);  Surgeon: Evans Lance, MD;  Location: [facility] CATH LAB;  Service: Cardiovascular;  Laterality: N/A;  . BI-VENTRICULAR IMPLANTABLE CARDIOVERTER DEFIBRILLATOR  (CRT-D)  07/28/2012  . BREAST BIOPSY Bilateral   . CATARACT EXTRACTION W/ INTRAOCULAR LENS IMPLANT Left   . CATARACT EXTRACTION W/PHACO Right 09/15/2013   Procedure: CATARACT EXTRACTION PHACO AND INTRAOCULAR LENS PLACEMENT (IOC);  Surgeon: Elta Guadeloupe T. Gershon Crane, MD;  Location: [facility] ORS;  Service: Ophthalmology;  Laterality: Right;  CDE:  10.74  . COLONOSCOPY N/A 03/04/2015   Procedure: COLONOSCOPY;  Surgeon: Rogene Houston, MD;  Location: [facility] ENDO SUITE;  Service: Endoscopy;  Laterality: N/A;  10:50   . ESOPHAGOGASTRODUODENOSCOPY N/A 03/04/2015  Procedure: ESOPHAGOGASTRODUODENOSCOPY (EGD);  Surgeon: Rogene Houston, MD;  Location: [facility] ENDO SUITE;  Service: Endoscopy;  Laterality: N/A;  . MASTECTOMY Left 1998  . TOTAL KNEE ARTHROPLASTY Right    Dr.Harrison  . TUBAL LIGATION     Social History   Social History Narrative   Married, lives with spouse   1 daughter, died in 80 in a car accident   Retired - worked in [employer]   No recent travel     Objective: Vital Signs: BP (!) 109/49   Pulse 85   Resp 13   Ht _0  (1.651 m)   Wt 148 lb (67.1 kg)   BMI 24.63 kg/m    Physical Exam  Constitutional:  She is oriented to person, place, and time. She appears well-developed and well-nourished.  HENT:  Head: Normocephalic and atraumatic.  Eyes: EOM are normal. Pupils are equal, round, and reactive to light.  Cardiovascular: Normal rate, regular rhythm and normal heart sounds.  Exam reveals no gallop and no friction rub.   No murmur heard. Pulmonary/Chest: Effort normal and breath sounds normal. She has no wheezes. She has no rales.  Abdominal: Soft. Bowel sounds are normal. She exhibits no distension. There is no tenderness. There is no guarding. No hernia.  Musculoskeletal: Normal range of motion. She exhibits no edema, tenderness or deformity.  Lymphadenopathy:    She has no cervical adenopathy.  Neurological: She is alert and oriented to person, place, and time. Coordination normal.  Skin: Skin is warm and dry. Capillary refill takes less than 2 seconds. No rash noted.  Psychiatric: She has a normal mood and affect. Her behavior is normal.  Nursing note and vitals reviewed.    Musculoskeletal Exam:  Full range of motion of all joints except decreased range of motion of bilateral wrists. Grip strength is decreased bilaterally Fibromyalgia tender points are all absent  CDAI Exam: CDAI Homunculus Exam:   Tenderness:  RUE: wrist Right hand: 2nd MCP, 3rd MCP and 5th MCP Left hand: 3rd MCP  Swelling:  RUE: wrist Right hand: 2nd MCP, 3rd MCP and 5th MCP Left hand: 3rd MCP  Joint Counts:  CDAI Tender Joint count: 5 CDAI Swollen Joint count: 5  Global Assessments:  Patient Global Assessment: 7 Provider Global Assessment: 7  CDAI Calculated Score: 24  Patient still has synovitis as diagrammed on the homunculus. Her joint pain stiffness and swelling has not changed yet. I don't expect her to have significant improvement at this time despite changing her treatment therapy from methotrexate to [facility] as we have her on low-dose Arava every other day currently. Please see history of  present illness for full details   Investigation: No additional findings.  Appointment on 06/04/2016  Component Date Value Ref Range Status  . WBC 06/04/2016 6.7  4.0 - 10.5 K/uL Final  . RBC 06/04/2016 2.91* 3.87 - 5.11 MIL/uL Final  . Hemoglobin 06/04/2016 6.2* 12.0 - 15.0 g/dL Final   Comment: REPEATED TO VERIFY CRITICAL RESULT CALLED TO, READ BACK BY AND VERIFIED WITH: BARNES,MICHELLE AT 0949 06/04/16 BY HFLYNT/BAYSE,L   . HCT 06/04/2016 20.8* 36.0 - 46.0 % Final  . MCV 06/04/2016 71.5* 78.0 - 100.0 fL Final  . MCH 06/04/2016 21.3* 26.0 - 34.0 pg Final  . MCHC 06/04/2016 29.8* 30.0 - 36.0 g/dL Final  . RDW 06/04/2016 19.2* 11.5 - 15.5 % Final  . Platelets 06/04/2016 260  150 - 400 K/uL Final  . Neutrophils Relative % 06/04/2016 67  % Final  .  Lymphocytes Relative 06/04/2016 17  % Final  . Monocytes Relative 06/04/2016 12  % Final  . Eosinophils Relative 06/04/2016 3  % Final  . Basophils Relative 06/04/2016 1  % Final  . Neutro Abs 06/04/2016 4.5  1.7 - 7.7 K/uL Final  . Lymphs Abs 06/04/2016 1.1  0.7 - 4.0 K/uL Final  . Monocytes Absolute 06/04/2016 0.8  0.1 - 1.0 K/uL Final  . Eosinophils Absolute 06/04/2016 0.2  0.0 - 0.7 K/uL Final  . Basophils Absolute 06/04/2016 0.1  0.0 - 0.1 K/uL Final  . RBC Morphology 06/04/2016 ANISOCYTES   Final  . Vitamin B-12 06/04/2016 714  180 - 914 pg/mL Final   Comment: (NOTE) This assay is not validated for testing neonatal or myeloproliferative syndrome specimens for Vitamin B12 levels. Performed at [facility], [place] [address] [place], [place] [address]   . Folate 06/04/2016 12.4  >5.9 ng/mL Final  . Iron 06/04/2016 23* 28 - 170 ug/dL Final  . TIBC 06/04/2016 234* 250 - 450 ug/dL Final  . Saturation Ratios 06/04/2016 10* 10.4 - 31.8 % Final  . UIBC 06/04/2016 211  ug/dL Final  . Ferritin 06/04/2016 41  11 - 307 ng/mL Final  . Blood Bank Specimen 06/04/2016 SAMPLE AVAILABLE FOR TESTING   Final  . Sample Expiration  06/04/2016 06/07/2016   Final  Hospital Outpatient Visit on 06/01/2016  Component Date Value Ref Range Status  . WBC 06/01/2016 6.9  4.0 - 10.5 K/uL Final  . RBC 06/01/2016 3.02* 3.87 - 5.11 MIL/uL Final  . Hemoglobin 06/01/2016 6.5* 12.0 - 15.0 g/dL Final   Comment: REPEATED TO VERIFY CRITICAL RESULT CALLED TO, READ BACK BY AND VERIFIED WITH: LITTREL,AMY RT AT 1605 BY HFLYNT 06/01/16   . HCT 06/01/2016 21.6* 36.0 - 46.0 % Final  . MCV 06/01/2016 71.5* 78.0 - 100.0 fL Final  . MCH 06/01/2016 21.5* 26.0 - 34.0 pg Final  . MCHC 06/01/2016 30.1  30.0 - 36.0 g/dL Final  . RDW 06/01/2016 19.2* 11.5 - 15.5 % Final  . Platelets 06/01/2016 292  150 - 400 K/uL Final  . Neutrophils Relative % 06/01/2016 64  % Final  . Lymphocytes Relative 06/01/2016 23  % Final  . Monocytes Relative 06/01/2016 9  % Final  . Eosinophils Relative 06/01/2016 3  % Final  . Basophils Relative 06/01/2016 1  % Final  . Neutro Abs 06/01/2016 4.4  1.7 - 7.7 K/uL Final  . Lymphs Abs 06/01/2016 1.6  0.7 - 4.0 K/uL Final  . Monocytes Absolute 06/01/2016 0.6  0.1 - 1.0 K/uL Final  . Eosinophils Absolute 06/01/2016 0.2  0.0 - 0.7 K/uL Final  . Basophils Absolute 06/01/2016 0.1  0.0 - 0.1 K/uL Final  . RBC Morphology 06/01/2016 SCHISTOCYTES NOTED ON SMEAR   Final  . Sodium 06/01/2016 132* 135 - 145 mmol/L Final  . Potassium 06/01/2016 4.7  3.5 - 5.1 mmol/L Final  . Chloride 06/01/2016 100* 101 - 111 mmol/L Final  . CO2 06/01/2016 25  22 - 32 mmol/L Final  . Glucose, Bld 06/01/2016 120* 65 - 99 mg/dL Final  . BUN 06/01/2016 43* 6 - 20 mg/dL Final  . Creatinine, Ser 06/01/2016 1.68* 0.44 - 1.00 mg/dL Final  . Calcium 06/01/2016 8.6* 8.9 - 10.3 mg/dL Final  . Total Protein 06/01/2016 8.3* 6.5 - 8.1 g/dL Final  . Albumin 06/01/2016 3.0* 3.5 - 5.0 g/dL Final  . AST 06/01/2016 15  15 - 41 U/L Final  . ALT 06/01/2016 8*  14 - 54 U/L Final  . Alkaline Phosphatase 06/01/2016 90  38 - 126 U/L Final  . Total Bilirubin 06/01/2016  0.4  0.3 - 1.2 mg/dL Final  . GFR calc non Af Amer 06/01/2016 28* >60 mL/min Final  . GFR calc Af Amer 06/01/2016 33* >60 mL/min Final   Comment: (NOTE) The eGFR has been calculated using the CKD EPI equation. This calculation has not been validated in all clinical situations. eGFR's persistently <60 mL/min signify possible Chronic Kidney Disease.   . Anion gap 06/01/2016 7  5 - 15 Final  Clinical Support on 04/30/2016  Component Date Value Ref Range Status  . Date Time Interrogation Session 04/30/2016 [id number]   Final  . Pulse Generator Manufacturer 04/30/2016 MERM   Final  . Pulse Gen Model 04/30/2016 [id number] Viva XT CRT-D   Final  . Pulse Gen Serial Number 04/30/2016 [id number]   Final  . Clinic Name 04/30/2016 [facility]   Final  . Implantable Pulse Generator Type 04/30/2016 Cardiac Resynch Therapy Defibulator   Final  . Implantable Pulse Generator Implan* 04/30/2016 [id number]   Final  . Implantable Lead Manufacturer 04/30/2016 MERM   Final  . Implantable Lead Model 04/30/2016 4194 Attain OTW   Final  . Implantable Lead Serial Number 04/30/2016 [id number]   Final  . Implantable Lead Implant Date 04/30/2016 [id number]   Final  . Implantable Lead Location Detail 1 04/30/2016 Lateral [place]   Final  . Implantable Lead Location 04/30/2016 [id number]   Final  . Implantable Lead Manufacturer 04/30/2016 MERM   Final  . Implantable Lead Model 04/30/2016 5076 CapSureFix Novus   Final  . Implantable Lead Serial Number 04/30/2016 [id number]   Final  . Implantable Lead Implant Date 04/30/2016 [id number]   Final  . Implantable Lead Location Detail 1 04/30/2016 APPENDAGE   Final  . Implantable Lead Location 04/30/2016 [id number]   Final  . Implantable Lead Manufacturer 04/30/2016 MERM   Final  . Implantable Lead Model 04/30/2016 6935 Sprint Quattro Secure S   Final  . Implantable Lead Serial Number 04/30/2016 [id number]   Final  . Implantable Lead Implant Date 04/30/2016 [id number]   Final  .  Implantable Lead Location Detail 1 04/30/2016 APEX   Final  . Implantable Lead Location 04/30/2016 [id number]   Final  . Lead Channel Setting Sensing Sensi* 04/30/2016 0.3  mV Final  . Lead Channel Setting Pacing Amplit* 04/30/2016 2  V Final  . Lead Channel Setting Pacing Pulse * 04/30/2016 0.4  ms Final  . Lead Channel Setting Pacing Amplit* 04/30/2016 2.5  V Final  . Lead Channel Setting Pacing Pulse * 04/30/2016 0.5  ms Final  . Lead Channel Setting Pacing Amplit* 04/30/2016 1.75  V Final  . Lead Channel Setting Pacing Captur* 04/30/2016 Adaptive Capture   Final  . Lead Channel Impedance Value 04/30/2016 323  ohm Final  . Lead Channel Sensing Intrinsic Amp* 04/30/2016 3.5  mV Final  . Lead Channel Sensing Intrinsic Amp* 04/30/2016 3.5  mV Final  . Lead Channel Pacing Threshold Ampl* 04/30/2016 0.625  V Final  . Lead Channel Pacing Threshold Puls* 04/30/2016 0.4  ms Final  . Lead Channel Impedance Value 04/30/2016 456  ohm Final  . Lead Channel Impedance Value 04/30/2016 342  ohm Final  . Lead Channel Sensing Intrinsic Amp* 04/30/2016 22.75  mV Final  . Lead Channel Sensing Intrinsic Amp* 04/30/2016 22.75  mV Final  . Lead Channel Pacing Threshold Ampl* 04/30/2016 1.[place]  Pacing Threshold Puls* 04/30/2016 0.4  ms Final  . HighPow Impedance 04/30/2016 79  ohm Final  . Lead Channel Impedance Value 04/30/2016 817  ohm Final  . Lead Channel Impedance Value 04/30/2016 589  ohm Final  . Lead Channel Impedance Value 04/30/2016 323  ohm Final  . Lead Channel Pacing Threshold Ampl* 04/30/2016 0.625  V Final  . Lead Channel Pacing Threshold Puls* 04/30/2016 0.5  ms Final  . Battery Status 04/30/2016 OK   Final  . Battery Remaining Longevity 04/30/2016 67  mo Final  . Battery Voltage 04/30/2016 2.98  V Final  . Brady Statistic RA Percent Paced 04/30/2016 9.87  % Final  . Brady Statistic RV Percent Paced 04/30/2016 3.39  % Final  . Brady Statistic AP VP Percent 04/30/2016  9.32  % Final  . Brady Statistic AS VP Percent 04/30/2016 88.02  % Final  . Brady Statistic AP VS Percent 04/30/2016 0.71  % Final  . Brady Statistic AS VS Percent 04/30/2016 1.95  % Final  . Eval Rhythm 04/30/2016 AsVp   Final    Imaging: No results found.  Speciality Comments: No specialty comments available.    Procedures:  No procedures performed Allergies: Patient has no known allergies.   Assessment / Plan:     Visit Diagnoses: Rheumatoid arthritis involving multiple sites with positive rheumatoid factor (HCC)  History of CHF (congestive heart failure)  History of chronic kidney disease  High risk medication use  Elevated sedimentation rate   Plan: #1: Rheumatoid arthritis with positive rheumatoid factor and positive CCP. She has synovitis on today's examination. Please see homunculus for full details   #2: High-risk prescription. On Arava10 mg every other day. Currently on Arava and low-dose due to the risk of affecting patient's hemoglobin/hematocrit. Patient has long-standing anemia already. She is already seeing a hematologist oncologist. Patient's most recent lab work showed lower hemoglobin and hematocrit on Friday, 06/01/2016.  Patient also takes Plaquenil 200 mg daily.  Note that we stop methotrexate due to adverse effect on the patient's creatinine and GFR.  #3: I advised the patient to get the transfusion this Friday as scheduled.  #4: Return to clinic in 6 weeks so we can recheck and make sure patient is stable and doing well with her medication, I'm hopeful that some of her synovitis and joint pain will be slightly improved at least in 6 weeks.  #5: On the next visit I have put an order for CBC with differential, CMP with GFR, uric acid. This will be done in 6 weeks  #6: Patient has a history of gout. No flare.  #7Shelby Mattocks, is caring for the patient and helps her with medication and doctor visits. His cell phone number is  [phone]  Orders: No orders of the defined types were placed in this encounter.  No orders of the defined types were placed in this encounter.   Face-to-face time spent with patient was 30 minutes. 50% of time was spent in counseling and coordination of care.  Follow-Up Instructions: Return in about 6 weeks (around 07/18/2016) for RA, arava 20m QOD due to Anemia concerns; Hx of Anemia and need of transfusion; PLQ [id number], [place];.   NaEliezer LoftsPA-C  Note - This record has been created using [employer] Chart creation errors have been sought, but may not always  have been located. Such creation errors do not reflect on  the standard of medical care.

## 2016-06-04 NOTE — Progress Notes (Signed)
[name], [name] [id number]   CLINIC:  Medical Oncology/Hematology  PCP:  [name], MD [place] [address] [phone]   REASON FOR VISIT:  Follow-up for iron-deficiency anemia AND remote h/o left breast cancer (1992)  CURRENT THERAPY: IV iron prn AND observation   BRIEF ONCOLOGIC HISTORY:    Breast cancer ([place])    Initial Diagnosis    Breast cancer        HISTORY OF PRESENT ILLNESS:  (Per Kirby Crigler, PA-C's note on 12/20/15)    INTERVAL HISTORY:  [name] presents for follow-up of her iron-deficiency anemia and h/o left breast cancer s/p mastectomy and 5 years of Tamoxifen therapy.   She is here today unaccompanied and is very hard of hearing, making it difficult to get a reliable history from the patient. She reports feeling more tired recently; she has some dyspnea on exertion where she has to stop and rest.  Denies any blood in her stools or dark/tarry stools.  Endorses occasional "little headaches"; she takes Tylenol very sparingly which is helpful and alleviates her headache.    Her appetite is good. Overall, she feels "pretty good", except for fatigue.    REVIEW OF SYSTEMS:  Review of Systems  Constitutional: Positive for fatigue. Negative for appetite change.  HENT:         Hard of hearing  Eyes: Negative.   Respiratory: Positive for shortness of breath (dyspnea on exertion ). Negative for cough.   Cardiovascular: Negative.  Negative for chest pain and palpitations.  Gastrointestinal: Negative.  Negative for abdominal pain, blood in stool, constipation, diarrhea, nausea and vomiting.  Endocrine: Negative.   Genitourinary: Negative.  Negative for dysuria, hematuria and vaginal bleeding.   Musculoskeletal: Negative.   Skin: Negative.  Negative for rash.  Neurological: Positive for headaches.  Hematological: Negative.   Psychiatric/Behavioral: Negative.      PAST MEDICAL/SURGICAL  HISTORY:  Past Medical History:  Diagnosis Date  . Anemia    Hgb of 9-10  . Anemia of chronic disease    Hgb of 9-10 chronically; 06/2010: H&H-10.7/33.5, MCV-81, normal iron studies in 2010   . Arteriosclerotic cardiovascular disease (ASCVD)    Remote PTCA by patient report; LBBB; associated cardiomyopathy, presumed ischemic with EF 40-45% previously, 20% in 06/2009; h/o clinical congestive heart failure; negative stress nuclear in 2009 with inferoseptal and apical scar  . Automatic implantable cardioverter-defibrillator in situ   . Breast cancer ([place])    breast  . Chronic bronchitis ([place])   . Chronic renal disease, stage 3, moderately decreased glomerular filtration rate (GFR) between 30-59 mL/min/1.73 square meter 02/01/2016  . Congestive heart failure (CHF) ([place])   . Elevated cholesterol   . Elevated sed rate   . GERD (gastroesophageal reflux disease)   . Gout   . HOH (hard of hearing)   . Hyperlipidemia   . Hypertension   . Rheumatoid arthritis [facility])    Past Surgical History:  Procedure Laterality Date  . BI-VENTRICULAR IMPLANTABLE CARDIOVERTER DEFIBRILLATOR N/A 07/28/2012   Procedure: BI-VENTRICULAR IMPLANTABLE CARDIOVERTER DEFIBRILLATOR  (CRT-D);  Surgeon: Evans Lance, MD;  Location: [facility] CATH LAB;  Service: Cardiovascular;  Laterality: N/A;  . BI-VENTRICULAR IMPLANTABLE CARDIOVERTER DEFIBRILLATOR  (CRT-D)  07/28/2012  . BREAST BIOPSY Bilateral   . CATARACT EXTRACTION W/ INTRAOCULAR LENS IMPLANT Left   . CATARACT EXTRACTION W/PHACO Right 09/15/2013   Procedure: CATARACT EXTRACTION PHACO AND INTRAOCULAR LENS PLACEMENT (IOC);  Surgeon: Elta Guadeloupe T. Gershon Crane, MD;  Location: [facility] ORS;  Service: Ophthalmology;  Laterality: Right;  CDE:  10.74  . COLONOSCOPY N/A 03/04/2015   Procedure: COLONOSCOPY;  Surgeon: Rogene Houston, MD;  Location: [facility] ENDO SUITE;  Service: Endoscopy;  Laterality: N/A;  10:50   . ESOPHAGOGASTRODUODENOSCOPY N/A 03/04/2015   Procedure: ESOPHAGOGASTRODUODENOSCOPY (EGD);   Surgeon: Rogene Houston, MD;  Location: [facility] ENDO SUITE;  Service: Endoscopy;  Laterality: N/A;  . MASTECTOMY Left 1998  . TOTAL KNEE ARTHROPLASTY Right    Dr.Harrison  . TUBAL LIGATION       SOCIAL HISTORY:  Social History   Social History  . Marital status: Married    Spouse name: N/A  . Number of children: N/A  . Years of education: N/A   Occupational History  . Retired    Social History Main Topics  . Smoking status: Former Smoker    Packs/day: 0.25    Types: Cigarettes    Start date: 09/17/1956    Quit date: 04/24/2007  . Smokeless tobacco: Current User    Types: Chew     Comment: 07/03/2013 "smoked some; don't know how much or how long or when I quit"  . Alcohol use No  . Drug use: No  . Sexual activity: Not Currently    Birth control/ protection: Post-menopausal   Other Topics Concern  . Not on file   Social History Narrative   Married, lives with spouse   1 daughter, died in 69 in a car accident   Retired - worked in [employer] fund[employer]   No recent travel    FAMILY HISTORY:  Family History  Problem Relation Age of Onset  . Diabetes Father   . Pancreatic cancer Father   . Hypertension Brother     CURRENT MEDICATIONS:  Outpatient Encounter Prescriptions as of 06/04/2016  Medication Sig  . acetaminophen (TYLENOL) 500 MG tablet Take 1,000 mg by mouth every 6 (six) hours as needed.   [name] aspirin EC 81 MG tablet Take 81 mg by mouth daily.  . carvedilol (COREG) 6.25 MG tablet TAKE 1 TABLET BY MOUTH TWICE A DAY WITH MEALS  . furosemide (LASIX) 40 MG tablet Take 1 tablet by mouth twice a day.  May take extra 40 mg for weight gain of 3 lbs in 24 hrs  . hydroxychloroquine (PLAQUENIL) 200 MG tablet Take 1 tablet (200 mg total) by mouth daily. Monday-Friday  . KLOR-CON M10 10 MEQ tablet TAKE 1 TABLET BY MOUTH TWICE A DAY  . leflunomide (ARAVA) 10 MG tablet Take 1 tablet (10 mg total) by mouth daily.  [name] lisinopril (PRINIVIL,ZESTRIL) 20 MG tablet Take 10 mg by mouth daily.   [name]  lovastatin (MEVACOR) 40 MG tablet TAKE 1 TABLET (40 MG TOTAL) BY MOUTH AT BEDTIME.  [name] omeprazole (PRILOSEC) 20 MG capsule Take 20 mg by mouth daily. For gas/heartburn  . ondansetron (ZOFRAN ODT) 4 MG disintegrating tablet Take 1 tablet (4 mg total) by mouth every 8 (eight) hours as needed.  . benzonatate (TESSALON) 100 MG capsule Take 2 capsules (200 mg total) by mouth 3 (three) times daily as needed. (Patient not taking: Reported on 03/06/2016)  . folic acid (FOLVITE) 1 MG tablet Take 1 mg by mouth daily.  . metolazone (ZAROXOLYN) 2.5 MG tablet Take 1 tablet daily for 2 days. (Patient not taking: Reported on 06/04/2016)  . naproxen sodium (ANAPROX) 220 MG tablet Take 220 mg by mouth 2 (two) times daily with a meal.  . PROAIR HFA 108 (90 BASE) MCG/ACT inhaler Inhale 2  puffs into the lungs every 4 (four) hours as needed for wheezing or shortness of breath. Reported on 08/26/2015  . [DISCONTINUED] lisinopril (PRINIVIL,ZESTRIL) 10 MG tablet Take 10 mg by mouth daily.   No facility-administered encounter medications on file as of 06/04/2016.     ALLERGIES:  No Known Allergies   PHYSICAL EXAM:  ECOG Performance status: 2 - Symptomatic; requires assistance.   Vitals:   06/04/16 0942  BP: (!) 121/43  Pulse: 75  Resp: 20  Temp: 98.8 F (37.1 C)   Filed Weights   06/04/16 0942  Weight: 143 lb 1.6 oz (64.9 kg)    Physical Exam  Constitutional: She is oriented to person, place, and time and well-developed, well-nourished, and in no distress.  HENT:  Head: Normocephalic.  Mouth/Throat: Oropharynx is clear and moist. Mucous membranes are pale. No oropharyngeal exudate.  Very hard of hearing   Eyes: Conjunctivae are normal. Pupils are equal, round, and reactive to light. No scleral icterus.  Neck: Normal range of motion. Neck supple.  Cardiovascular: Normal rate, regular rhythm and normal heart sounds.   Pulmonary/Chest: Effort normal and breath sounds normal. No respiratory distress.     Abdominal: Soft. Bowel sounds are normal. She exhibits no distension. There is no tenderness.  Musculoskeletal: Normal range of motion. She exhibits no edema.  Required assistance to get onto exam table.  (R) wrist brace in place.   Lymphadenopathy:    She has no cervical adenopathy.  Neurological: She is alert and oriented to person, place, and time.  Skin: Skin is warm and dry. No rash noted.  Psychiatric: Mood, memory, affect and judgment normal.     LABORATORY DATA:  I have reviewed the labs as listed.  CBC    Component Value Date/Time   WBC 6.7 06/04/2016 0920   RBC 2.91 (L) 06/04/2016 0920   HGB 6.2 (LL) 06/04/2016 0920   HCT 20.8 (L) 06/04/2016 0920   PLT 260 06/04/2016 0920   MCV 71.5 (L) 06/04/2016 0920   MCH 21.3 (L) 06/04/2016 0920   MCHC 29.8 (L) 06/04/2016 0920   RDW 19.2 (H) 06/04/2016 0920   LYMPHSABS 1.1 06/04/2016 0920   MONOABS 0.8 06/04/2016 0920   EOSABS 0.2 06/04/2016 0920   BASOSABS 0.1 06/04/2016 0920   CMP Latest Ref Rng & Units 06/01/2016 01/31/2016 01/11/2016  Glucose 65 - 99 mg/dL 120(H) 97 109(H)  BUN 6 - 20 mg/dL 43(H) 34(H) 38(H)  Creatinine 0.44 - 1.00 mg/dL 1.68(H) 1.31(H) 1.48(H)  Sodium 135 - 145 mmol/L 132(L) 137 135  Potassium 3.5 - 5.1 mmol/L 4.7 4.5 4.1  Chloride 101 - 111 mmol/L 100(L) 107 104  CO2 22 - 32 mmol/L 25 23 24   Calcium 8.9 - 10.3 mg/dL 8.6(L) 9.0 8.7(L)  Total Protein 6.5 - 8.1 g/dL 8.3(H) 7.9 7.9  Total Bilirubin 0.3 - 1.2 mg/dL 0.4 0.5 0.5  Alkaline Phos 38 - 126 U/L 90 103 103  AST 15 - 41 U/L 15 17 16   ALT 14 - 54 U/L 8(L) 11(L) 8(L)         PENDING LABS:    DIAGNOSTIC IMAGING:  Last mammogram: 03/05/16   PATHOLOGY:     ASSESSMENT & PLAN:   Iron deficiency anemia:  -Hgb critical alert 6.2 g/dL today; she has some fatigue and dyspnea on exertion.  Denies any blood in her stools/melena.   -Last IV iron infusion (Ferric gluconate 125 mg on 02/09/16).  -Calculated iron deficit based on today's  hemoglobin and weight = 1,085.  Serum iron low today at 23 ug/dL; ferritin low at 41 ng/mL. Goal is iron >100 ug/dL.  Serum folate/B12 normal.  -We will make arrangements for her to get 2 units PRBCs this week, along with 1 dose of Feraheme 510 mg (since we are transfusing blood as well).  Supportive therapy plan for Feraheme created today.  -Return to cancer center in 3 months for continued follow-up.   History of left breast cancer:  -Breast exam benign today; no evidence of recurrence. She is now 16+ years out from her breast cancer diagnosis.  -Due for unilateral right breast mammogram in 02/2017.      Dispo:  -Return to cancer center on Wednesday, 06/06/16 for PRBC & IV iron infusion -Return to cancer center for follow-up in 3 months.    All questions were answered to patient's stated satisfaction. Encouraged patient to call with any new concerns or questions before her next visit to the cancer center and we can certain see her sooner, if needed.      Mike Craze, NP [place] [phone]

## 2016-06-04 NOTE — Patient Instructions (Signed)
[place] at [facility] Discharge Instructions  RECOMMENDATIONS MADE BY THE CONSULTANT AND ANY TEST RESULTS WILL BE SENT TO YOUR REFERRING PHYSICIAN.  Exam with Mike Craze, NP today. You need two units of blood.   We will schedule you this week to come back for that.   Please see Angie as you leave for appointments.    Thank you for choosing [place] at [facility] to provide your oncology and hematology care.  To afford each patient quality time with our provider, please arrive at least 15 minutes before your scheduled appointment time.    If you have a lab appointment with the [place] please come in thru the  Main Entrance and check in at the main information desk  You need to re-schedule your appointment should you arrive 10 or more minutes late.  We strive to give you quality time with our providers, and arriving late affects you and other patients whose appointments are after yours.  Also, if you no show three or more times for appointments you may be dismissed from the clinic at the providers discretion.     Again, thank you for choosing [facility].  Our hope is that these requests will decrease the amount of time that you wait before being seen by our physicians.       _____________________________________________________________  Should you have questions after your visit to [facility], please contact our office at [phone] between the hours of 8:30 a.m. and 4:30 p.m.  Voicemails left after 4:30 p.m. will not be returned until the following business day.  For prescription refill requests, have your pharmacy contact our office.       Resources For Cancer Patients and their Caregivers ? American Cancer Society: Can assist with transportation, wigs, general needs, runs Look Good Feel Better.        (478) 705-7104 ? Cancer Care: Provides financial assistance, online support groups,  medication/co-pay assistance.  1-800-813-HOPE (316) 211-9882) ? [place] Assists [place] cancer patients and their families through emotional , educational and financial support.  806-101-5306 ? [place] DSS Where to apply for food stamps, Medicaid and utility assistance. 807-040-9892 ? RCATS: Transportation to medical appointments. (249) 213-1540 ? Social Security Administration: May apply for disability if have a Stage IV cancer. 807-318-8104 248-342-2582 ? [employer], Disability and Transit Services: Assists with nutrition, care and transit needs. [place] Support Programs: @10RELATIVEDAYS @ > Cancer Support Group  2nd Tuesday of the month 1pm-2pm, Journey Room  > Creative Journey  3rd Tuesday of the month 1130am-1pm, Journey Room  > Look Good Feel Better  1st Wednesday of the month 10am-12 noon, Journey Room (Call [place] to register [phone])

## 2016-06-04 NOTE — Progress Notes (Signed)
CRITICAL VALUE ALERT Critical value received:  Hgb-6.2 Date of notification:  06/04/2016 Time of notification: 0948 Critical value read back:  Yes.   Nurse who received alert:  M.Embrie Mikkelsen, LPN MD notified (1st page):  Mike Craze, NP

## 2016-06-06 ENCOUNTER — Encounter: Payer: Self-pay | Admitting: Rheumatology

## 2016-06-06 ENCOUNTER — Ambulatory Visit (INDEPENDENT_AMBULATORY_CARE_PROVIDER_SITE_OTHER): Payer: Medicare HMO | Admitting: Rheumatology

## 2016-06-06 VITALS — BP 109/49 | HR 85 | Resp 13 | Ht 65.0 in | Wt 148.0 lb

## 2016-06-06 DIAGNOSIS — R700 Elevated erythrocyte sedimentation rate: Secondary | ICD-10-CM | POA: Diagnosis not present

## 2016-06-06 DIAGNOSIS — M0579 Rheumatoid arthritis with rheumatoid factor of multiple sites without organ or systems involvement: Secondary | ICD-10-CM

## 2016-06-06 DIAGNOSIS — M1A00X0 Idiopathic chronic gout, unspecified site, without tophus (tophi): Secondary | ICD-10-CM

## 2016-06-06 DIAGNOSIS — Z87448 Personal history of other diseases of urinary system: Secondary | ICD-10-CM | POA: Diagnosis not present

## 2016-06-06 DIAGNOSIS — Z79899 Other long term (current) drug therapy: Secondary | ICD-10-CM | POA: Diagnosis not present

## 2016-06-06 DIAGNOSIS — Z8679 Personal history of other diseases of the circulatory system: Secondary | ICD-10-CM

## 2016-06-07 ENCOUNTER — Encounter (HOSPITAL_COMMUNITY): Payer: Medicare HMO

## 2016-06-07 DIAGNOSIS — Z79899 Other long term (current) drug therapy: Secondary | ICD-10-CM | POA: Diagnosis not present

## 2016-06-07 DIAGNOSIS — Z853 Personal history of malignant neoplasm of breast: Secondary | ICD-10-CM | POA: Diagnosis not present

## 2016-06-07 DIAGNOSIS — Z87891 Personal history of nicotine dependence: Secondary | ICD-10-CM | POA: Diagnosis not present

## 2016-06-07 DIAGNOSIS — D638 Anemia in other chronic diseases classified elsewhere: Secondary | ICD-10-CM | POA: Diagnosis not present

## 2016-06-07 DIAGNOSIS — D509 Iron deficiency anemia, unspecified: Secondary | ICD-10-CM | POA: Diagnosis not present

## 2016-06-07 DIAGNOSIS — Z7982 Long term (current) use of aspirin: Secondary | ICD-10-CM | POA: Diagnosis not present

## 2016-06-07 DIAGNOSIS — D649 Anemia, unspecified: Secondary | ICD-10-CM

## 2016-06-07 LAB — RETICULOCYTES
RBC.: 2.95 MIL/uL — AB (ref 3.87–5.11)
Retic Count, Absolute: 70.8 10*3/uL (ref 19.0–186.0)
Retic Ct Pct: 2.4 % (ref 0.4–3.1)

## 2016-06-07 LAB — LACTATE DEHYDROGENASE: LDH: 157 U/L (ref 98–192)

## 2016-06-07 LAB — PREPARE RBC (CROSSMATCH)

## 2016-06-08 ENCOUNTER — Encounter (HOSPITAL_COMMUNITY): Payer: Self-pay

## 2016-06-08 ENCOUNTER — Encounter (HOSPITAL_BASED_OUTPATIENT_CLINIC_OR_DEPARTMENT_OTHER): Payer: Medicare HMO

## 2016-06-08 VITALS — BP 113/48 | HR 71 | Temp 98.1°F | Resp 16

## 2016-06-08 DIAGNOSIS — N183 Chronic kidney disease, stage 3 (moderate): Secondary | ICD-10-CM

## 2016-06-08 DIAGNOSIS — D508 Other iron deficiency anemias: Secondary | ICD-10-CM

## 2016-06-08 DIAGNOSIS — D649 Anemia, unspecified: Secondary | ICD-10-CM

## 2016-06-08 DIAGNOSIS — Z87891 Personal history of nicotine dependence: Secondary | ICD-10-CM | POA: Diagnosis not present

## 2016-06-08 DIAGNOSIS — N182 Chronic kidney disease, stage 2 (mild): Secondary | ICD-10-CM

## 2016-06-08 DIAGNOSIS — Z7982 Long term (current) use of aspirin: Secondary | ICD-10-CM | POA: Diagnosis not present

## 2016-06-08 DIAGNOSIS — N1832 Chronic kidney disease, stage 3b: Secondary | ICD-10-CM

## 2016-06-08 DIAGNOSIS — Z79899 Other long term (current) drug therapy: Secondary | ICD-10-CM | POA: Diagnosis not present

## 2016-06-08 DIAGNOSIS — D509 Iron deficiency anemia, unspecified: Secondary | ICD-10-CM | POA: Diagnosis not present

## 2016-06-08 DIAGNOSIS — Z853 Personal history of malignant neoplasm of breast: Secondary | ICD-10-CM | POA: Diagnosis not present

## 2016-06-08 DIAGNOSIS — D638 Anemia in other chronic diseases classified elsewhere: Secondary | ICD-10-CM

## 2016-06-08 LAB — HAPTOGLOBIN: Haptoglobin: 526 mg/dL — ABNORMAL HIGH (ref 34–200)

## 2016-06-08 LAB — PATHOLOGIST SMEAR REVIEW

## 2016-06-08 MED ORDER — ACETAMINOPHEN 325 MG PO TABS
ORAL_TABLET | ORAL | Status: AC
  Filled 2016-06-08: qty 2

## 2016-06-08 MED ORDER — SODIUM CHLORIDE 0.9 % IV SOLN
250.0000 mL | Freq: Once | INTRAVENOUS | Status: AC
  Administered 2016-06-08: 250 mL via INTRAVENOUS

## 2016-06-08 MED ORDER — ACETAMINOPHEN 325 MG PO TABS
650.0000 mg | ORAL_TABLET | Freq: Once | ORAL | Status: AC
  Administered 2016-06-08: 650 mg via ORAL

## 2016-06-08 MED ORDER — DIPHENHYDRAMINE HCL 25 MG PO CAPS
25.0000 mg | ORAL_CAPSULE | Freq: Once | ORAL | Status: AC
  Administered 2016-06-08: 25 mg via ORAL

## 2016-06-08 MED ORDER — DIPHENHYDRAMINE HCL 25 MG PO CAPS
ORAL_CAPSULE | ORAL | Status: AC
  Filled 2016-06-08: qty 1

## 2016-06-08 MED ORDER — SODIUM CHLORIDE 0.9 % IV SOLN
510.0000 mg | Freq: Once | INTRAVENOUS | Status: AC
  Administered 2016-06-08: 510 mg via INTRAVENOUS
  Filled 2016-06-08: qty 17

## 2016-06-08 NOTE — Progress Notes (Signed)
Patient tolerated all infusions well.  VSS.  Patient ambulatory and stable upon discharge from clinic.

## 2016-06-08 NOTE — Patient Instructions (Addendum)
[place] at [facility] Discharge Instructions  RECOMMENDATIONS MADE BY THE CONSULTANT AND ANY TEST RESULTS WILL BE SENT TO YOUR REFERRING PHYSICIAN.  Two units of blood today and iron infusion.    Thank you for choosing [place] at [facility] to provide your oncology and hematology care.  To afford each patient quality time with our provider, please arrive at least 15 minutes before your scheduled appointment time.    If you have a lab appointment with the [place] please come in thru the  Main Entrance and check in at the main information desk  You need to re-schedule your appointment should you arrive 10 or more minutes late.  We strive to give you quality time with our providers, and arriving late affects you and other patients whose appointments are after yours.  Also, if you no show three or more times for appointments you may be dismissed from the clinic at the providers discretion.     Again, thank you for choosing [facility].  Our hope is that these requests will decrease the amount of time that you wait before being seen by our physicians.       _____________________________________________________________  Should you have questions after your visit to [facility], please contact our office at [phone] between the hours of 8:30 a.m. and 4:30 p.m.  Voicemails left after 4:30 p.m. will not be returned until the following business day.  For prescription refill requests, have your pharmacy contact our office.       Resources For Cancer Patients and their Caregivers ? American Cancer Society: Can assist with transportation, wigs, general needs, runs Look Good Feel Better.        (540) 213-9620 ? Cancer Care: Provides financial assistance, online support groups, medication/co-pay assistance.  1-800-813-HOPE (214) 591-0091) ? [place] Assists [place] cancer patients and  their families through emotional , educational and financial support.  4700792204 ? [place] DSS Where to apply for food stamps, Medicaid and utility assistance. 706-649-2355 ? RCATS: Transportation to medical appointments. (626)093-1897 ? Social Security Administration: May apply for disability if have a Stage IV cancer. (574) 529-8868 415 852 4630 ? [employer], Disability and Transit Services: Assists with nutrition, care and transit needs. [place] Support Programs: @10RELATIVEDAYS @ > Cancer Support Group  2nd Tuesday of the month 1pm-2pm, Journey Room  > Creative Journey  3rd Tuesday of the month 1130am-1pm, Journey Room  > Look Good Feel Better  1st Wednesday of the month 10am-12 noon, Journey Room (Call [place] to register [phone])

## 2016-06-09 LAB — TYPE AND SCREEN
ABO/RH(D): AB POS
ANTIBODY SCREEN: NEGATIVE
UNIT DIVISION: 0
Unit division: 0

## 2016-06-14 ENCOUNTER — Other Ambulatory Visit: Payer: Self-pay | Admitting: Rheumatology

## 2016-06-14 NOTE — Telephone Encounter (Signed)
Last Visit: 06/06/16 Next Visit: 07/18/16 Labs: 06/01/16 Patient has a history of anemia (severe). #2: CMP with GFR is consistent with past labs except sodium is low at 132 and chloride slightly low at 100. (Patient will need to address with PCP) Kidney function is abnormal with elevated creatinine 1.68 and decreased GFR at 33  Okay to refill Arava?

## 2016-06-18 DIAGNOSIS — I5022 Chronic systolic (congestive) heart failure: Secondary | ICD-10-CM | POA: Diagnosis not present

## 2016-06-18 DIAGNOSIS — I10 Essential (primary) hypertension: Secondary | ICD-10-CM | POA: Diagnosis not present

## 2016-06-18 DIAGNOSIS — M069 Rheumatoid arthritis, unspecified: Secondary | ICD-10-CM | POA: Diagnosis not present

## 2016-06-21 ENCOUNTER — Other Ambulatory Visit (HOSPITAL_COMMUNITY): Payer: Self-pay | Admitting: *Deleted

## 2016-06-21 DIAGNOSIS — D638 Anemia in other chronic diseases classified elsewhere: Secondary | ICD-10-CM

## 2016-06-22 ENCOUNTER — Encounter (HOSPITAL_COMMUNITY): Payer: Medicare HMO | Attending: Adult Health

## 2016-06-22 DIAGNOSIS — D638 Anemia in other chronic diseases classified elsewhere: Secondary | ICD-10-CM | POA: Insufficient documentation

## 2016-06-22 DIAGNOSIS — Z79899 Other long term (current) drug therapy: Secondary | ICD-10-CM | POA: Insufficient documentation

## 2016-06-22 DIAGNOSIS — Z7982 Long term (current) use of aspirin: Secondary | ICD-10-CM | POA: Insufficient documentation

## 2016-06-22 DIAGNOSIS — D509 Iron deficiency anemia, unspecified: Secondary | ICD-10-CM | POA: Diagnosis not present

## 2016-06-22 DIAGNOSIS — Z87891 Personal history of nicotine dependence: Secondary | ICD-10-CM | POA: Diagnosis not present

## 2016-06-22 DIAGNOSIS — Z853 Personal history of malignant neoplasm of breast: Secondary | ICD-10-CM | POA: Insufficient documentation

## 2016-06-22 LAB — CBC WITH DIFFERENTIAL/PLATELET
BASOS PCT: 1 %
Basophils Absolute: 0.1 10*3/uL (ref 0.0–0.1)
EOS ABS: 0.1 10*3/uL (ref 0.0–0.7)
EOS PCT: 1 %
HCT: 31.9 % — ABNORMAL LOW (ref 36.0–46.0)
Hemoglobin: 9.9 g/dL — ABNORMAL LOW (ref 12.0–15.0)
Lymphocytes Relative: 17 %
Lymphs Abs: 1.1 10*3/uL (ref 0.7–4.0)
MCH: 23.5 pg — ABNORMAL LOW (ref 26.0–34.0)
MCHC: 31 g/dL (ref 30.0–36.0)
MCV: 75.8 fL — ABNORMAL LOW (ref 78.0–100.0)
MONO ABS: 1 10*3/uL (ref 0.1–1.0)
Monocytes Relative: 16 %
Neutro Abs: 4.2 10*3/uL (ref 1.7–7.7)
Neutrophils Relative %: 65 %
PLATELETS: 242 10*3/uL (ref 150–400)
RBC: 4.21 MIL/uL (ref 3.87–5.11)
RDW: 20.5 % — AB (ref 11.5–15.5)
WBC: 6.5 10*3/uL (ref 4.0–10.5)

## 2016-06-22 LAB — COMPREHENSIVE METABOLIC PANEL
ALBUMIN: 3.1 g/dL — AB (ref 3.5–5.0)
ALT: 10 U/L — ABNORMAL LOW (ref 14–54)
ANION GAP: 9 (ref 5–15)
AST: 17 U/L (ref 15–41)
Alkaline Phosphatase: 107 U/L (ref 38–126)
BUN: 37 mg/dL — AB (ref 6–20)
CHLORIDE: 106 mmol/L (ref 101–111)
CO2: 25 mmol/L (ref 22–32)
Calcium: 8.9 mg/dL (ref 8.9–10.3)
Creatinine, Ser: 1.44 mg/dL — ABNORMAL HIGH (ref 0.44–1.00)
GFR calc Af Amer: 39 mL/min — ABNORMAL LOW (ref >60)
GFR, EST NON AFRICAN AMERICAN: 34 mL/min — AB (ref >60)
GLUCOSE: 114 mg/dL — AB (ref 65–99)
POTASSIUM: 3.7 mmol/L (ref 3.5–5.1)
Sodium: 140 mmol/L (ref 135–145)
Total Bilirubin: 0.5 mg/dL (ref 0.3–1.2)
Total Protein: 8.3 g/dL — ABNORMAL HIGH (ref 6.5–8.1)

## 2016-07-02 ENCOUNTER — Ambulatory Visit (INDEPENDENT_AMBULATORY_CARE_PROVIDER_SITE_OTHER): Payer: Medicare HMO

## 2016-07-02 DIAGNOSIS — I5022 Chronic systolic (congestive) heart failure: Secondary | ICD-10-CM

## 2016-07-02 DIAGNOSIS — Z95810 Presence of automatic (implantable) cardiac defibrillator: Secondary | ICD-10-CM

## 2016-07-03 NOTE — Progress Notes (Signed)
EPIC Encounter for ICM Monitoring  Patient Name: [name] is a 76 y.o. female Date: 07/03/2016 Primary Care Physican: [name], MD Primary [place] Electrophysiologist: Lovena Le Dry Weight:unknown Bi-V Pacing: 90.2%       Transmission reviewed   Thoracic impedance normal   Prescribed dosage: Furosemide 40 mg 1 tablet by mouth twice a day. May take extra 40 mg for weight gain of 3 lbs in 24 hrs.  Klor Con 10 mEq 1 tablet twice a day.    LABS:  06/22/2016 Creatinine 1.44, BUN 37, Potassium 3.7, Sodium 140, EGFR 34-39 06/01/2016 Creatinine 1.68, BUN 43, Potassium 4.7, Sodium 132, EGFR 28-33 01/31/2016 Creatinine 1.31, BUN 34, Potassium 4.5, Sodium 137 GFR 38-44 01/11/2016 Creatinine 1.48, BUN 38, Potassium 4.1, Sodium 135 GFR 33-38 12/20/2015 Creatinine 1.77, BUN 40, Potassium 4.0, Sodium 140, GFR 27-31 12/02/2015 Creatinine 1.38, BUN 28, Potassium 4.0, Sodium 136, GFR 36-42 11/29/2015 Creatinine 1.68, BUN 31, Potassium 4.1, Sodium 137, GFR 28-33 11/02/2015 Creatinine 1.73, BUN 41, Potassium 4.5, Sodium 137, GFR 27-32 10/21/2015 Creatinine 2.01, BUN 35, Potassium 5.0, Sodium 136, GFR 23-26 10/12/2015 Creatinine 1.61, BUN 30, Potassium 4.0, Sodium 138, GFR 30-34 10/06/2015 Creatinine 1.45, BUN 33, Potassium 3.7, Sodium 138, GFR 34-39 09/30/2015 Creatinine 1.55, BUN 37, Potassium 4.2, Sodium 139, GFR 31-36 09/21/2015 Creatinine 1.72, BUN 49, Potassium 3.7, Sodium 138, GFR 27-32  Recommendations: None  Follow-up plan: ICM clinic phone appointment on 08/06/2016.  Copy of ICM check sent to device physician.   3 month ICM trend: 07/02/2016   1 Year ICM trend:      [name], RN 07/03/2016 3:42 PM

## 2016-07-04 ENCOUNTER — Other Ambulatory Visit: Payer: Self-pay | Admitting: Rheumatology

## 2016-07-04 NOTE — Telephone Encounter (Signed)
Last Visit: 06/06/16 Next Visit: 07/18/16 Labs: 06/01/16 Patient has a history of anemia (severe). #2: CMP with GFR is consistent with past labs except sodium is low at 132 and chloride slightly low at 100. (Patient will need to address with PCP) Kidney function is abnormal with elevated creatinine 1.68 and decreased GFR at 33  Okay to refill Arava?

## 2016-07-05 ENCOUNTER — Other Ambulatory Visit (HOSPITAL_COMMUNITY): Payer: Self-pay | Admitting: *Deleted

## 2016-07-05 DIAGNOSIS — D649 Anemia, unspecified: Secondary | ICD-10-CM

## 2016-07-06 ENCOUNTER — Encounter (HOSPITAL_COMMUNITY): Payer: Medicare HMO

## 2016-07-06 DIAGNOSIS — Z79899 Other long term (current) drug therapy: Secondary | ICD-10-CM | POA: Diagnosis not present

## 2016-07-06 DIAGNOSIS — Z853 Personal history of malignant neoplasm of breast: Secondary | ICD-10-CM | POA: Diagnosis not present

## 2016-07-06 DIAGNOSIS — D509 Iron deficiency anemia, unspecified: Secondary | ICD-10-CM | POA: Diagnosis not present

## 2016-07-06 DIAGNOSIS — Z7982 Long term (current) use of aspirin: Secondary | ICD-10-CM | POA: Diagnosis not present

## 2016-07-06 DIAGNOSIS — D638 Anemia in other chronic diseases classified elsewhere: Secondary | ICD-10-CM | POA: Diagnosis not present

## 2016-07-06 DIAGNOSIS — Z87891 Personal history of nicotine dependence: Secondary | ICD-10-CM | POA: Diagnosis not present

## 2016-07-06 LAB — CBC WITH DIFFERENTIAL/PLATELET
BASOS ABS: 0.1 10*3/uL (ref 0.0–0.1)
Basophils Relative: 1 %
EOS ABS: 0.1 10*3/uL (ref 0.0–0.7)
EOS PCT: 2 %
HCT: 30.4 % — ABNORMAL LOW (ref 36.0–46.0)
Hemoglobin: 9.5 g/dL — ABNORMAL LOW (ref 12.0–15.0)
LYMPHS ABS: 1.5 10*3/uL (ref 0.7–4.0)
Lymphocytes Relative: 23 %
MCH: 23.6 pg — ABNORMAL LOW (ref 26.0–34.0)
MCHC: 31.3 g/dL (ref 30.0–36.0)
MCV: 75.6 fL — AB (ref 78.0–100.0)
Monocytes Absolute: 0.7 10*3/uL (ref 0.1–1.0)
Monocytes Relative: 10 %
NEUTROS PCT: 65 %
Neutro Abs: 4.2 10*3/uL (ref 1.7–7.7)
PLATELETS: 232 10*3/uL (ref 150–400)
RBC: 4.02 MIL/uL (ref 3.87–5.11)
RDW: 21.1 % — AB (ref 11.5–15.5)
WBC: 6.5 10*3/uL (ref 4.0–10.5)

## 2016-07-06 LAB — SAMPLE TO BLOOD BANK

## 2016-07-07 LAB — IRON AND TIBC
Iron: 26 ug/dL — ABNORMAL LOW (ref 28–170)
SATURATION RATIOS: 15 % (ref 10.4–31.8)
TIBC: 172 ug/dL — ABNORMAL LOW (ref 250–450)
UIBC: 146 ug/dL

## 2016-07-07 LAB — FERRITIN: Ferritin: 497 ng/mL — ABNORMAL HIGH (ref 11–307)

## 2016-07-07 LAB — VITAMIN B12: VITAMIN B 12: 876 pg/mL (ref 180–914)

## 2016-07-07 LAB — FOLATE: Folate: 11.5 ng/mL (ref >5.9)

## 2016-07-17 DIAGNOSIS — Z862 Personal history of diseases of the blood and blood-forming organs and certain disorders involving the immune mechanism: Secondary | ICD-10-CM | POA: Insufficient documentation

## 2016-07-17 NOTE — Progress Notes (Signed)
Office Visit Note  Patient: [name]             Date of Birth: 03-26-1941           MRN: [id number]             PCP: Savannah Fire, MD Referring: Savannah Fire, MD Visit Date: 07/18/2016 Occupation: _0 @    Subjective:  Follow-up   History of Present Illness: [name] is a 76 y.o. female  Follow-up on rheumatoid arthritis and Plaquenil 200 mg daily and new start of [name] about 6 weeks ago. The due to the risk of worsening anemia when you are on [place], we are giving the patient 10 mg every other day and see how well she responds to the medication. Her joints are about the same and have not gotten much better yet. She is only been on the medication for about 6 weeks now and we may need to give her more time. Today we are reviewing her CBC profile to be sure the [name] is not harming her as well as checking her joints to make sure that it is having a positive affect.60   Activities of Daily Living:  Patient reports morning stiffness for 60 minutes.   Patient Reports nocturnal pain.  Difficulty dressing/grooming: Reports Difficulty climbing stairs: Reports Difficulty getting out of chair: Reports Difficulty using hands for taps, buttons, cutlery, and/or writing: Reports   Review of Systems  Constitutional: Negative for fatigue.  HENT: Negative for mouth sores and mouth dryness.   Eyes: Negative for dryness.  Respiratory: Negative for shortness of breath.   Gastrointestinal: Negative for constipation and diarrhea.  Musculoskeletal: Negative for myalgias and myalgias.  Skin: Negative for sensitivity to sunlight.  Psychiatric/Behavioral: Negative for decreased concentration and sleep disturbance.    PMFS History:  Patient Active Problem List   Diagnosis Date Noted  . History of anemia 07/17/2016  . History of CHF (congestive heart failure) 06/04/2016  . History of chronic kidney disease 06/04/2016  . Symptomatic anemia 06/04/2016  . Elevated sedimentation  rate 03/03/2016  . Idiopathic chronic gout of multiple sites without tophus 03/03/2016  . Total knee replacement status, right 03/03/2016  . High risk medication use 03/03/2016  . Chronic kidney disease (CKD) stage G3b/A1, moderately decreased glomerular filtration rate (GFR) between 30-44 mL/min/1.73 square meter and albuminuria creatinine ratio less than 30 mg/g 02/01/2016  . CAD S/P remote PCI- no details 09/02/2014  . Cardiomyopathy, ischemic-EF 30-35% March 2015 09/02/2014  . Diastolic dysfunction-grade 2 09/02/2014  . CHF exacerbation ([place]) 08/28/2014  . Acute on chronic combined systolic and diastolic congestive heart failure ([employer]) 08/28/2014  . Iron deficiency anemia 08/20/2013  . Malnutrition of moderate degree ([place]) 07/21/2013  . PNA (pneumonia) 07/19/2013  . HCAP (healthcare-associated pneumonia) 07/17/2013  . Sepsis ([place]) 07/17/2013  . ARF (acute renal failure) ([place]) 07/17/2013  . Rheumatoid arthritis ([place]) 07/04/2013  . Hypokalemia 07/03/2013  . Chest pain 07/03/2013  . Biventricular ICD in place (MDT 2014) 11/06/2012  . Anemia of chronic disease   . Breast cancer ([place])   . Hypertension   . Dyslipidemia 05/24/2009  . Chronic systolic heart failure ([place]) 05/24/2009    Past Medical History:  Diagnosis Date  . Anemia    Hgb of 9-10  . Anemia of chronic disease    Hgb of 9-10 chronically; 06/2010: H&H-10.7/33.5, MCV-81, normal iron studies in 2010   . Arteriosclerotic cardiovascular disease (ASCVD)    Remote PTCA by patient report; LBBB; associated cardiomyopathy,  presumed ischemic with EF 40-45% previously, 20% in 06/2009; h/o clinical congestive heart failure; negative stress nuclear in 2009 with inferoseptal and apical scar  . Automatic implantable cardioverter-defibrillator in situ   . Breast cancer ([place])    breast  . Chronic bronchitis ([place])   . Chronic renal disease, stage 3, moderately decreased glomerular filtration rate (GFR) between 30-59 mL/min/1.73 square  meter 02/01/2016  . Congestive heart failure (CHF) ([place])   . Elevated cholesterol   . Elevated sed rate   . GERD (gastroesophageal reflux disease)   . Gout   . HOH (hard of hearing)   . Hyperlipidemia   . Hypertension   . Rheumatoid arthritis ([place])     Family History  Problem Relation Age of Onset  . Diabetes Father   . Pancreatic cancer Father   . Hypertension Brother    Past Surgical History:  Procedure Laterality Date  . BI-VENTRICULAR IMPLANTABLE CARDIOVERTER DEFIBRILLATOR N/A 07/28/2012   Procedure: BI-VENTRICULAR IMPLANTABLE CARDIOVERTER DEFIBRILLATOR  (CRT-D);  Surgeon: Savannah Lance, MD;  Location: [facility] CATH LAB;  Service: Cardiovascular;  Laterality: N/A;  . BI-VENTRICULAR IMPLANTABLE CARDIOVERTER DEFIBRILLATOR  (CRT-D)  07/28/2012  . BREAST BIOPSY Bilateral   . CATARACT EXTRACTION W/ INTRAOCULAR LENS IMPLANT Left   . CATARACT EXTRACTION W/PHACO Right 09/15/2013   Procedure: CATARACT EXTRACTION PHACO AND INTRAOCULAR LENS PLACEMENT (IOC);  Surgeon: Savannah Guadeloupe T. Gershon Crane, MD;  Location: [facility] ORS;  Service: Ophthalmology;  Laterality: Right;  CDE:  10.74  . COLONOSCOPY N/A 03/04/2015   Procedure: COLONOSCOPY;  Surgeon: Savannah Houston, MD;  Location: [facility] ENDO SUITE;  Service: Endoscopy;  Laterality: N/A;  10:50   . ESOPHAGOGASTRODUODENOSCOPY N/A 03/04/2015   Procedure: ESOPHAGOGASTRODUODENOSCOPY (EGD);  Surgeon: Savannah Houston, MD;  Location: [facility] ENDO SUITE;  Service: Endoscopy;  Laterality: N/A;  . MASTECTOMY Left 1998  . TOTAL KNEE ARTHROPLASTY Right    Dr.Harrison  . TUBAL LIGATION     Social History   Social History Narrative   Married, lives with spouse   1 daughter, died in 26 in a car accident   Retired - worked in [employer]   No recent travel     Objective: Vital Signs: BP 118/60   Pulse 80   Resp 14   Ht _0  (1.6 m)   Wt 144 lb (65.3 kg)   BMI 25.51 kg/m    Physical Exam  Constitutional: She is oriented to person, place, and time. She appears well-developed  and well-nourished.  HENT:  Head: Normocephalic and atraumatic.  Eyes: EOM are normal. Pupils are equal, round, and reactive to light.  Cardiovascular: Normal rate, regular rhythm and normal heart sounds.  Exam reveals no gallop and no friction rub.   No murmur heard. Pulmonary/Chest: Effort normal and breath sounds normal. She has no wheezes. She has no rales.  Abdominal: Soft. Bowel sounds are normal. She exhibits no distension. There is no tenderness. There is no guarding. No hernia.  Musculoskeletal: Normal range of motion. She exhibits no edema, tenderness or deformity.  Lymphadenopathy:    She has no cervical adenopathy.  Neurological: She is alert and oriented to person, place, and time. Coordination normal.  Skin: Skin is warm and dry. Capillary refill takes less than 2 seconds. No rash noted.  Psychiatric: She has a normal mood and affect. Her behavior is normal.  Nursing note and vitals reviewed.    Musculoskeletal Exam:  No change in range of motion compared to last visit about 6 weeks ago. Grip strength is  equal and strong bilaterally Fiber myalgia tender points are absent  CDAI Exam: CDAI Homunculus Exam:   Tenderness:  RUE: wrist Right hand: 3rd MCP  Swelling:  RUE: wrist Right hand: 3rd MCP  Joint Counts:  CDAI Tender Joint count: 2 CDAI Swollen Joint count: 2  Patient has had little relief with the [place] as it relates to her right wrist and her right third MCP joint. On examination there is synovitis there. She does wear brace on her right wrist No warmth to touch. Mild synovitis continues .   The left wrist and left MCP joint are all doing well. No tenderness to palpation. She has old damage to both wrists and hands but the right one is tender as noted above. The left one is nontender although there is synovial thickening of the left third MCP joint.   Investigation: Findings:  Note that we stop methotrexate due to adverse effect on the patient's  creatinine and GFR.  most recent lab work showed lower hemoglobin and hematocrit on Friday, 06/01/2016  Appointment on 07/06/2016  Component Date Value Ref Range Status  . WBC 07/06/2016 6.5  4.0 - 10.5 K/uL Final  . RBC 07/06/2016 4.02  3.87 - 5.11 MIL/uL Final  . Hemoglobin 07/06/2016 9.5* 12.0 - 15.0 g/dL Final  . HCT 07/06/2016 30.4* 36.0 - 46.0 % Final  . MCV 07/06/2016 75.6* 78.0 - 100.0 fL Final  . MCH 07/06/2016 23.6* 26.0 - 34.0 pg Final  . MCHC 07/06/2016 31.3  30.0 - 36.0 g/dL Final  . RDW 07/06/2016 21.1* 11.5 - 15.5 % Final  . Platelets 07/06/2016 232  150 - 400 K/uL Final  . Neutrophils Relative % 07/06/2016 65  % Final  . Neutro Abs 07/06/2016 4.2  1.7 - 7.7 K/uL Final  . Lymphocytes Relative 07/06/2016 23  % Final  . Lymphs Abs 07/06/2016 1.5  0.7 - 4.0 K/uL Final  . Monocytes Relative 07/06/2016 10  % Final  . Monocytes Absolute 07/06/2016 0.7  0.1 - 1.0 K/uL Final  . Eosinophils Relative 07/06/2016 2  % Final  . Eosinophils Absolute 07/06/2016 0.1  0.0 - 0.7 K/uL Final  . Basophils Relative 07/06/2016 1  % Final  . Basophils Absolute 07/06/2016 0.1  0.0 - 0.1 K/uL Final  . Smear Review 07/06/2016 MORPHOLOGY UNREMARKABLE   Final  . Iron 07/06/2016 26* 28 - 170 ug/dL Final  . TIBC 07/06/2016 172* 250 - 450 ug/dL Final  . Saturation Ratios 07/06/2016 15  10.4 - 31.8 % Final  . UIBC 07/06/2016 146  ug/dL Final  . Ferritin 07/06/2016 497* 11 - 307 ng/mL Final  . Vitamin B-12 07/06/2016 876  180 - 914 pg/mL Final   Comment: (NOTE) This assay is not validated for testing neonatal or myeloproliferative syndrome specimens for Vitamin B12 levels. Performed at [facility], [place] [address], [place], [place] [address]   . Folate 07/06/2016 11.5  >5.9 ng/mL Final  . Blood Bank Specimen 07/06/2016 SAMPLE AVAILABLE FOR TESTING   Final  . Sample Expiration 07/06/2016 07/09/2016   Final  Appointment on 06/22/2016  Component Date Value Ref Range Status  . WBC  06/22/2016 6.5  4.0 - 10.5 K/uL Final  . RBC 06/22/2016 4.21  3.87 - 5.11 MIL/uL Final  . Hemoglobin 06/22/2016 9.9* 12.0 - 15.0 g/dL Final  . HCT 06/22/2016 31.9* 36.0 - 46.0 % Final  . MCV 06/22/2016 75.8* 78.0 - 100.0 fL Final  . MCH 06/22/2016 23.5* 26.0 - 34.0 pg Final  .  MCHC 06/22/2016 31.0  30.0 - 36.0 g/dL Final  . RDW 06/22/2016 20.5* 11.5 - 15.5 % Final  . Platelets 06/22/2016 242  150 - 400 K/uL Final  . Neutrophils Relative % 06/22/2016 65  % Final  . Neutro Abs 06/22/2016 4.2  1.7 - 7.7 K/uL Final  . Lymphocytes Relative 06/22/2016 17  % Final  . Lymphs Abs 06/22/2016 1.1  0.7 - 4.0 K/uL Final  . Monocytes Relative 06/22/2016 16  % Final  . Monocytes Absolute 06/22/2016 1.0  0.1 - 1.0 K/uL Final  . Eosinophils Relative 06/22/2016 1  % Final  . Eosinophils Absolute 06/22/2016 0.1  0.0 - 0.7 K/uL Final  . Basophils Relative 06/22/2016 1  % Final  . Basophils Absolute 06/22/2016 0.1  0.0 - 0.1 K/uL Final  . Sodium 06/22/2016 140  135 - 145 mmol/L Final  . Potassium 06/22/2016 3.7  3.5 - 5.1 mmol/L Final  . Chloride 06/22/2016 106  101 - 111 mmol/L Final  . CO2 06/22/2016 25  22 - 32 mmol/L Final  . Glucose, Bld 06/22/2016 114* 65 - 99 mg/dL Final  . BUN 06/22/2016 37* 6 - 20 mg/dL Final  . Creatinine, Ser 06/22/2016 1.44* 0.44 - 1.00 mg/dL Final  . Calcium 06/22/2016 8.9  8.9 - 10.3 mg/dL Final  . Total Protein 06/22/2016 8.3* 6.5 - 8.1 g/dL Final  . Albumin 06/22/2016 3.1* 3.5 - 5.0 g/dL Final  . AST 06/22/2016 17  15 - 41 U/L Final  . ALT 06/22/2016 10* 14 - 54 U/L Final  . Alkaline Phosphatase 06/22/2016 107  38 - 126 U/L Final  . Total Bilirubin 06/22/2016 0.5  0.3 - 1.2 mg/dL Final  . GFR calc non Af Amer 06/22/2016 34* >60 mL/min Final  . GFR calc Af Amer 06/22/2016 39* >60 mL/min Final   Comment: (NOTE) The eGFR has been calculated using the CKD EPI equation. This calculation has not been validated in all clinical situations. eGFR's persistently <60 mL/min  signify possible Chronic Kidney Disease.   . Anion gap 06/22/2016 9  5 - 15 Final  Lab on 06/07/2016  Component Date Value Ref Range Status  . Order Confirmation 06/07/2016 ORDER PROCESSED BY BLOOD BANK   Final  . Haptoglobin 06/07/2016 526* 34 - 200 mg/dL Final   Comment: (NOTE) Performed At: [facility] [place], [place] [id number] Lindon Romp MD [id number]   . LDH 06/07/2016 157  98 - 192 U/L Final  . Retic Ct Pct 06/07/2016 2.4  0.4 - 3.1 % Final  . RBC. 06/07/2016 2.95* 3.87 - 5.11 MIL/uL Final  . Retic Count, Manual 06/07/2016 70.8  19.0 - 186.0 K/uL Final  . Path Review 06/07/2016 Reviewed By Violet Baldy, M.D.   Final   Comment: 02.16.2018 MICROCYTIC ANEMIA. Performed at [facility], [place] [address] [place], [place] [address]   . ABO/RH(D) 06/07/2016 AB POS   Final  . Antibody Screen 06/07/2016 NEG   Final  . Sample Expiration 06/07/2016 06/10/2016   Final  . Unit Number 06/07/2016 [id number]   Final  . Blood Component Type 06/07/2016 RED CELLS,LR   Final  . Unit division 06/07/2016 00   Final  . Status of Unit 06/07/2016 ISSUED,FINAL   Final  . Transfusion Status 06/07/2016 OK TO TRANSFUSE   Final  . Crossmatch Result 06/07/2016 Compatible   Final  . Unit Number 06/07/2016 [id number]   Final  . Blood Component Type 06/07/2016 RED CELLS,LR   Final  .  Unit division 06/07/2016 00   Final  . Status of Unit 06/07/2016 ISSUED,FINAL   Final  . Transfusion Status 06/07/2016 OK TO TRANSFUSE   Final  . Crossmatch Result 06/07/2016 Compatible   Final  Appointment on 06/04/2016  Component Date Value Ref Range Status  . WBC 06/04/2016 6.7  4.0 - 10.5 K/uL Final  . RBC 06/04/2016 2.91* 3.87 - 5.11 MIL/uL Final  . Hemoglobin 06/04/2016 6.2* 12.0 - 15.0 g/dL Final   Comment: REPEATED TO VERIFY CRITICAL RESULT CALLED TO, READ BACK BY AND VERIFIED WITH: BARNES,MICHELLE AT 0949 06/04/16 BY HFLYNT/BAYSE,L   . HCT 06/04/2016  20.8* 36.0 - 46.0 % Final  . MCV 06/04/2016 71.5* 78.0 - 100.0 fL Final  . MCH 06/04/2016 21.3* 26.0 - 34.0 pg Final  . MCHC 06/04/2016 29.8* 30.0 - 36.0 g/dL Final  . RDW 06/04/2016 19.2* 11.5 - 15.5 % Final  . Platelets 06/04/2016 260  150 - 400 K/uL Final  . Neutrophils Relative % 06/04/2016 67  % Final  . Lymphocytes Relative 06/04/2016 17  % Final  . Monocytes Relative 06/04/2016 12  % Final  . Eosinophils Relative 06/04/2016 3  % Final  . Basophils Relative 06/04/2016 1  % Final  . Neutro Abs 06/04/2016 4.5  1.7 - 7.7 K/uL Final  . Lymphs Abs 06/04/2016 1.1  0.7 - 4.0 K/uL Final  . Monocytes Absolute 06/04/2016 0.8  0.1 - 1.0 K/uL Final  . Eosinophils Absolute 06/04/2016 0.2  0.0 - 0.7 K/uL Final  . Basophils Absolute 06/04/2016 0.1  0.0 - 0.1 K/uL Final  . RBC Morphology 06/04/2016 ANISOCYTES   Final  . Vitamin B-12 06/04/2016 714  180 - 914 pg/mL Final   Comment: (NOTE) This assay is not validated for testing neonatal or myeloproliferative syndrome specimens for Vitamin B12 levels. Performed at [facility], [place] [address] [place],  [address]   . Folate 06/04/2016 12.4  >5.9 ng/mL Final  . Iron 06/04/2016 23* 28 - 170 ug/dL Final  . TIBC 06/04/2016 234* 250 - 450 ug/dL Final  . Saturation Ratios 06/04/2016 10* 10.4 - 31.8 % Final  . UIBC 06/04/2016 211  ug/dL Final  . Ferritin 06/04/2016 41  11 - 307 ng/mL Final  . Blood Bank Specimen 06/04/2016 SAMPLE AVAILABLE FOR TESTING   Final  . Sample Expiration 06/04/2016 06/07/2016   Final  Hospital Outpatient Visit on 06/01/2016  Component Date Value Ref Range Status  . WBC 06/01/2016 6.9  4.0 - 10.5 K/uL Final  . RBC 06/01/2016 3.02* 3.87 - 5.11 MIL/uL Final  . Hemoglobin 06/01/2016 6.5* 12.0 - 15.0 g/dL Final   Comment: REPEATED TO VERIFY CRITICAL RESULT CALLED TO, READ BACK BY AND VERIFIED WITH: LITTREL,AMY RT AT 1605 BY HFLYNT 06/01/16   . HCT 06/01/2016 21.6* 36.0 - 46.0 % Final  . MCV 06/01/2016 71.5*  78.0 - 100.0 fL Final  . MCH 06/01/2016 21.5* 26.0 - 34.0 pg Final  . MCHC 06/01/2016 30.1  30.0 - 36.0 g/dL Final  . RDW 06/01/2016 19.2* 11.5 - 15.5 % Final  . Platelets 06/01/2016 292  150 - 400 K/uL Final  . Neutrophils Relative % 06/01/2016 64  % Final  . Lymphocytes Relative 06/01/2016 23  % Final  . Monocytes Relative 06/01/2016 9  % Final  . Eosinophils Relative 06/01/2016 3  % Final  . Basophils Relative 06/01/2016 1  % Final  . Neutro Abs 06/01/2016 4.4  1.7 - 7.7 K/uL Final  . Lymphs Abs 06/01/2016  1.6  0.7 - 4.0 K/uL Final  . Monocytes Absolute 06/01/2016 0.6  0.1 - 1.0 K/uL Final  . Eosinophils Absolute 06/01/2016 0.2  0.0 - 0.7 K/uL Final  . Basophils Absolute 06/01/2016 0.1  0.0 - 0.1 K/uL Final  . RBC Morphology 06/01/2016 SCHISTOCYTES NOTED ON SMEAR   Final  . Sodium 06/01/2016 132* 135 - 145 mmol/L Final  . Potassium 06/01/2016 4.7  3.5 - 5.1 mmol/L Final  . Chloride 06/01/2016 100* 101 - 111 mmol/L Final  . CO2 06/01/2016 25  22 - 32 mmol/L Final  . Glucose, Bld 06/01/2016 120* 65 - 99 mg/dL Final  . BUN 06/01/2016 43* 6 - 20 mg/dL Final  . Creatinine, Ser 06/01/2016 1.68* 0.44 - 1.00 mg/dL Final  . Calcium 06/01/2016 8.6* 8.9 - 10.3 mg/dL Final  . Total Protein 06/01/2016 8.3* 6.5 - 8.1 g/dL Final  . Albumin 06/01/2016 3.0* 3.5 - 5.0 g/dL Final  . AST 06/01/2016 15  15 - 41 U/L Final  . ALT 06/01/2016 8* 14 - 54 U/L Final  . Alkaline Phosphatase 06/01/2016 90  38 - 126 U/L Final  . Total Bilirubin 06/01/2016 0.4  0.3 - 1.2 mg/dL Final  . GFR calc non Af Amer 06/01/2016 28* >60 mL/min Final  . GFR calc Af Amer 06/01/2016 33* >60 mL/min Final   Comment: (NOTE) The eGFR has been calculated using the CKD EPI equation. This calculation has not been validated in all clinical situations. eGFR's persistently <60 mL/min signify possible Chronic Kidney Disease.   . Anion gap 06/01/2016 7  5 - 15 Final  Clinical Support on 04/30/2016  Component Date Value Ref Range  Status  . Date Time Interrogation Session 04/30/2016 [id number]   Final  . Pulse Generator Manufacturer 04/30/2016 MERM   Final  . Pulse Gen Model 04/30/2016 [id number] Viva XT CRT-D   Final  . Pulse Gen Serial Number 04/30/2016 [id number]   Final  . Clinic Name 04/30/2016 [place]   Final  . Implantable Pulse Generator Type 04/30/2016 Cardiac Resynch Therapy Defibulator   Final  . Implantable Pulse Generator Implan* 04/30/2016 [id number]   Final  . Implantable Lead Manufacturer 04/30/2016 MERM   Final  . Implantable Lead Model 04/30/2016 4194 Attain OTW   Final  . Implantable Lead Serial Number 04/30/2016 [id number]   Final  . Implantable Lead Implant Date 04/30/2016 [id number]   Final  . Implantable Lead Location Detail 1 04/30/2016 Lateral Wall   Final  . Implantable Lead Location 04/30/2016 [id number]   Final  . Implantable Lead Manufacturer 04/30/2016 MERM   Final  . Implantable Lead Model 04/30/2016 5076 CapSureFix Novus   Final  . Implantable Lead Serial Number 04/30/2016 [id number]   Final  . Implantable Lead Implant Date 04/30/2016 [id number]   Final  . Implantable Lead Location Detail 1 04/30/2016 APPENDAGE   Final  . Implantable Lead Location 04/30/2016 [id number]   Final  . Implantable Lead Manufacturer 04/30/2016 MERM   Final  . Implantable Lead Model 04/30/2016 6935 Sprint Quattro Secure S   Final  . Implantable Lead Serial Number 04/30/2016 [id number]   Final  . Implantable Lead Implant Date 04/30/2016 [id number]   Final  . Implantable Lead Location Detail 1 04/30/2016 APEX   Final  . Implantable Lead Location 04/30/2016 [id number]   Final  . Lead Channel Setting Sensing Sensi* 04/30/2016 0.3  mV Final  . Lead Channel Setting Pacing Amplit* 04/30/2016 2  V Final  . Lead Channel Setting Pacing Pulse * 04/30/2016  0.4  ms Final  . Lead Channel Setting Pacing Amplit* 04/30/2016 2.5  V Final  . Lead Channel Setting Pacing Pulse * 04/30/2016 0.5  ms Final  . Lead Channel Setting Pacing  Amplit* 04/30/2016 1.75  V Final  . Lead Channel Setting Pacing Captur* 04/30/2016 Adaptive Capture   Final  . Lead Channel Impedance Value 04/30/2016 323  ohm Final  . Lead Channel Sensing Intrinsic Amp* 04/30/2016 3.5  mV Final  . Lead Channel Sensing Intrinsic Amp* 04/30/2016 3.5  mV Final  . Lead Channel Pacing Threshold Ampl* 04/30/2016 0.625  V Final  . Lead Channel Pacing Threshold Puls* 04/30/2016 0.4  ms Final  . Lead Channel Impedance Value 04/30/2016 456  ohm Final  . Lead Channel Impedance Value 04/30/2016 342  ohm Final  . Lead Channel Sensing Intrinsic Amp* 04/30/2016 22.75  mV Final  . Lead Channel Sensing Intrinsic Amp* 04/30/2016 22.75  mV Final  . Lead Channel Pacing Threshold Ampl* 04/30/2016 1.125  V Final  . Lead Channel Pacing Threshold Puls* 04/30/2016 0.4  ms Final  . HighPow Impedance 04/30/2016 79  ohm Final  . Lead Channel Impedance Value 04/30/2016 817  ohm Final  . Lead Channel Impedance Value 04/30/2016 589  ohm Final  . Lead Channel Impedance Value 04/30/2016 323  ohm Final  . Lead Channel Pacing Threshold Ampl* 04/30/2016 0.625  V Final  . Lead Channel Pacing Threshold Puls* 04/30/2016 0.5  ms Final  . Battery Status 04/30/2016 OK   Final  . Battery Remaining Longevity 04/30/2016 67  mo Final  . Battery Voltage 04/30/2016 2.98  V Final  . Brady Statistic RA Percent Paced 04/30/2016 9.87  % Final  . Brady Statistic RV Percent Paced 04/30/2016 3.39  % Final  . Brady Statistic AP VP Percent 04/30/2016 9.32  % Final  . Brady Statistic AS VP Percent 04/30/2016 88.02  % Final  . Brady Statistic AP VS Percent 04/30/2016 0.71  % Final  . Brady Statistic AS VS Percent 04/30/2016 1.95  % Final  . Eval Rhythm 04/30/2016 AsVp   Final  Office Visit on 03/06/2016  Component Date Value Ref Range Status  . Color, Urine 03/06/2016 YELLOW  YELLOW Final  . APPearance 03/06/2016 CLEAR  CLEAR Final  . Specific Gravity, Urine 03/06/2016 1.017  1.001 - 1.035 Final  . pH  03/06/2016 5.0  5.0 - 8.0 Final  . Glucose, UA 03/06/2016 NEGATIVE  NEGATIVE Final  . Bilirubin Urine 03/06/2016 NEGATIVE  NEGATIVE Final  . Ketones, ur 03/06/2016 NEGATIVE  NEGATIVE Final  . Hgb urine dipstick 03/06/2016 NEGATIVE  NEGATIVE Final  . Protein, ur 03/06/2016 NEGATIVE  NEGATIVE Final  . Nitrite 03/06/2016 NEGATIVE  NEGATIVE Final  . Leukocytes, UA 03/06/2016 3+* NEGATIVE Final  . WBC, UA 03/06/2016 >60* <=5 WBC/HPF Final  . RBC / HPF 03/06/2016 0-2  <=2 RBC/HPF Final  . Squamous Epithelial / LPF 03/06/2016 6-10* <=5 HPF Final  . Bacteria, UA 03/06/2016 MODERATE* NONE SEEN HPF Final  . Crystals 03/06/2016 NONE SEEN  NONE SEEN HPF Final  . Casts 03/06/2016 NONE SEEN  NONE SEEN LPF Final  . Yeast 03/06/2016 NONE SEEN  NONE SEEN HPF Final  Appointment on 01/31/2016  Component Date Value Ref Range Status  . WBC 01/31/2016 7.4  4.0 - 10.5 K/uL Final  . RBC 01/31/2016 3.24* 3.87 - 5.11 MIL/uL Final  . Hemoglobin 01/31/2016 7.6* 12.0 - 15.0 g/dL Final  . HCT 01/31/2016 24.6* 36.0 - 46.0 % Final  .  MCV 01/31/2016 75.9* 78.0 - 100.0 fL Final  . MCH 01/31/2016 23.5* 26.0 - 34.0 pg Final  . MCHC 01/31/2016 30.9  30.0 - 36.0 g/dL Final  . RDW 01/31/2016 17.1* 11.5 - 15.5 % Final  . Platelets 01/31/2016 265  150 - 400 K/uL Final  . Neutrophils Relative % 01/31/2016 68  % Final  . Neutro Abs 01/31/2016 5.0  1.7 - 7.7 K/uL Final  . Lymphocytes Relative 01/31/2016 18  % Final  . Lymphs Abs 01/31/2016 1.3  0.7 - 4.0 K/uL Final  . Monocytes Relative 01/31/2016 11  % Final  . Monocytes Absolute 01/31/2016 0.8  0.1 - 1.0 K/uL Final  . Eosinophils Relative 01/31/2016 3  % Final  . Eosinophils Absolute 01/31/2016 0.2  0.0 - 0.7 K/uL Final  . Basophils Relative 01/31/2016 0  % Final  . Basophils Absolute 01/31/2016 0.0  0.0 - 0.1 K/uL Final  . Sodium 01/31/2016 137  135 - 145 mmol/L Final  . Potassium 01/31/2016 4.5  3.5 - 5.1 mmol/L Final  . Chloride 01/31/2016 107  101 - 111 mmol/L Final   . CO2 01/31/2016 23  22 - 32 mmol/L Final  . Glucose, Bld 01/31/2016 97  65 - 99 mg/dL Final  . BUN 01/31/2016 34* 6 - 20 mg/dL Final  . Creatinine, Ser 01/31/2016 1.31* 0.44 - 1.00 mg/dL Final  . Calcium 01/31/2016 9.0  8.9 - 10.3 mg/dL Final  . Total Protein 01/31/2016 7.9  6.5 - 8.1 g/dL Final  . Albumin 01/31/2016 3.1* 3.5 - 5.0 g/dL Final  . AST 01/31/2016 17  15 - 41 U/L Final  . ALT 01/31/2016 11* 14 - 54 U/L Final  . Alkaline Phosphatase 01/31/2016 103  38 - 126 U/L Final  . Total Bilirubin 01/31/2016 0.5  0.3 - 1.2 mg/dL Final  . GFR calc non Af Amer 01/31/2016 38* >60 mL/min Final  . GFR calc Af Amer 01/31/2016 44* >60 mL/min Final   Comment: (NOTE) The eGFR has been calculated using the CKD EPI equation. This calculation has not been validated in all clinical situations. eGFR's persistently <60 mL/min signify possible Chronic Kidney Disease.   . Anion gap 01/31/2016 7  5 - 15 Final  . Iron 01/31/2016 19* 28 - 170 ug/dL Final  . TIBC 01/31/2016 218* 250 - 450 ug/dL Final  . Saturation Ratios 01/31/2016 9* 10.4 - 31.8 % Final  . UIBC 01/31/2016 199  ug/dL Final  . Ferritin 01/31/2016 95  11 - 307 ng/mL Final  Clinical Support on 01/30/2016  Component Date Value Ref Range Status  . Date Time Interrogation Session 01/30/2016 [id number]   Final  . Pulse Generator Manufacturer 01/30/2016 MERM   Final  . Pulse Gen Model 01/30/2016 [id number] Viva XT CRT-D   Final  . Pulse Gen Serial Number 01/30/2016 [id number]   Final  . Implantable Pulse Generator Type 01/30/2016 Cardiac Resynch Therapy Defibulator   Final  . Implantable Pulse Generator Implan* 01/30/2016 [id number]   Final  . Implantable Lead Manufacturer 01/30/2016 MERM   Final  . Implantable Lead Model 01/30/2016 4194   Final  . Implantable Lead Serial Number 01/30/2016 [id number]   Final  . Implantable Lead Implant Date 01/30/2016 [id number]   Final  . Implantable Lead Location 01/30/2016 [id number]   Final  .  Implantable Lead Manufacturer 01/30/2016 MERM   Final  . Implantable Lead Model 01/30/2016 5076 CapSureFix Novus   Final  . Implantable Lead Serial Number 01/30/2016 [id number]   Final  .  Implantable Lead Implant Date 01/30/2016 [id number]   Final  . Implantable Lead Location 01/30/2016 [id number]   Final  . Implantable Lead Manufacturer 01/30/2016 MERM   Final  . Implantable Lead Model 01/30/2016 6935   Final  . Implantable Lead Serial Number 01/30/2016 [id number]   Final  . Implantable Lead Implant Date 01/30/2016 [id number]   Final  . Implantable Lead Location 01/30/2016 [id number]   Final  . Lead Channel Setting Sensing Sensi* 01/30/2016 0.3  mV Final  . Lead Channel Setting Pacing Amplit* 01/30/2016 2  V Final  . Lead Channel Setting Pacing Pulse * 01/30/2016 0.5  ms Final  . Lead Channel Setting Pacing Amplit* 01/30/2016 1.75  V Final  . Lead Channel Setting Pacing Captur* 01/30/2016 Adaptive Capture   Final  . Lead Channel Impedance Value 01/30/2016 380  ohm Final  . Lead Channel Sensing Intrinsic Amp* 01/30/2016 3.125  mV Final  . Lead Channel Sensing Intrinsic Amp* 01/30/2016 3.125  mV Final  . Lead Channel Pacing Threshold Ampl* 01/30/2016 0.5  V Final  . Lead Channel Pacing Threshold Puls* 01/30/2016 0.4  ms Final  . Lead Channel Impedance Value 01/30/2016 494  ohm Final  . Lead Channel Impedance Value 01/30/2016 380  ohm Final  . Lead Channel Sensing Intrinsic Amp* 01/30/2016 28.5  mV Final  . Lead Channel Sensing Intrinsic Amp* 01/30/2016 28.5  mV Final  . Lead Channel Pacing Threshold Ampl* 01/30/2016 1.25  V Final  . Lead Channel Pacing Threshold Puls* 01/30/2016 0.4  ms Final  . HighPow Impedance 01/30/2016 78  ohm Final  . Lead Channel Impedance Value 01/30/2016 893  ohm Final  . Lead Channel Impedance Value 01/30/2016 380  ohm Final  . Lead Channel Impedance Value 01/30/2016 646  ohm Final  . Lead Channel Pacing Threshold Ampl* 01/30/2016 0.625  V Final  . Lead Channel Pacing  Threshold Puls* 01/30/2016 0.5  ms Final  . Battery Status 01/30/2016 OK   Final  . Battery Remaining Longevity 01/30/2016 70  mo Final  . Battery Voltage 01/30/2016 2.98  V Final  . Brady Statistic RA Percent Paced 01/30/2016 21.17  % Final  . Brady Statistic RV Percent Paced 01/30/2016 11.67  % Final  . Brady Statistic AP VP Percent 01/30/2016 20.14  % Final  . Brady Statistic AS VP Percent 01/30/2016 77.44  % Final  . Brady Statistic AP VS Percent 01/30/2016 1.03  % Final  . Brady Statistic AS VS Percent 01/30/2016 1.39  % Final  . Eval Rhythm 01/30/2016 AP,VP   Final      Imaging: No results found.  Speciality Comments: No specialty comments available.     Procedures:  No procedures performed Allergies: Patient has no known allergies.   Assessment / Plan:     Visit Diagnoses: Rheumatoid arthritis involving multiple sites with positive rheumatoid factor (HCC) - +CCP  History of CHF (congestive heart failure)  History of chronic kidney disease  High risk medication use - Arava-10 mg every other dayPLQ-222m tablet by mouth daily. Monday-Friday   Elevated sedimentation rate  Idiopathic chronic gout of multiple sites without tophus  History of anemia   Plan: #1:Rheumatoid arthritis. We have added Arava to the patient's medications. She has been taking 10 mg every other day. We observed this schedule because patient has a history of anemia and Arava can worsen the anemia. She's here for recheck.  Please note: Were monitoring the patient's CBC that has a history of anemia after starting the patient  on [place]. Hemoglobin from 06/01/2016 was 6.5 and it has risen to 9.5 on 07/06/2016 labs  Hematocrit improved from 21.6 at 30.4 MCV improved from 71.5-75.6 [facility] improved from 21.5-23.6  Her CBC has not been adversely affected with the start of [place] and we will continue Arava at the current dose at 10 mg every other day  #2: High-risk prescription Arava 68m every  other day (due to risk of making anemia worse therefore we are being cautious with dose.  Patient is taking Plaquenil at 200 mg daily.  Orders: No orders of the defined types were placed in this encounter.  No orders of the defined types were placed in this encounter.   Face-to-face time spent with patient was 30 minutes. 50% of time was spent in counseling and coordination of care.  Follow-Up Instructions: Return in about 6 weeks (around 08/29/2016).   NEliezer Lofts PA-C  Note - This record has been created using [employer]  Chart creation errors have been sought, but may not always  have been located. Such creation errors do not reflect on  the standard of medical care.

## 2016-07-18 ENCOUNTER — Ambulatory Visit (INDEPENDENT_AMBULATORY_CARE_PROVIDER_SITE_OTHER): Payer: Medicare HMO | Admitting: Rheumatology

## 2016-07-18 ENCOUNTER — Encounter: Payer: Self-pay | Admitting: Rheumatology

## 2016-07-18 ENCOUNTER — Other Ambulatory Visit (HOSPITAL_COMMUNITY): Payer: Self-pay | Admitting: *Deleted

## 2016-07-18 VITALS — BP 118/60 | HR 80 | Resp 14 | Ht 63.0 in | Wt 144.0 lb

## 2016-07-18 DIAGNOSIS — M0579 Rheumatoid arthritis with rheumatoid factor of multiple sites without organ or systems involvement: Secondary | ICD-10-CM | POA: Diagnosis not present

## 2016-07-18 DIAGNOSIS — Z8679 Personal history of other diseases of the circulatory system: Secondary | ICD-10-CM

## 2016-07-18 DIAGNOSIS — M1A09X0 Idiopathic chronic gout, multiple sites, without tophus (tophi): Secondary | ICD-10-CM | POA: Diagnosis not present

## 2016-07-18 DIAGNOSIS — Z862 Personal history of diseases of the blood and blood-forming organs and certain disorders involving the immune mechanism: Secondary | ICD-10-CM | POA: Diagnosis not present

## 2016-07-18 DIAGNOSIS — R700 Elevated erythrocyte sedimentation rate: Secondary | ICD-10-CM

## 2016-07-18 DIAGNOSIS — Z79899 Other long term (current) drug therapy: Secondary | ICD-10-CM | POA: Diagnosis not present

## 2016-07-18 DIAGNOSIS — Z87448 Personal history of other diseases of urinary system: Secondary | ICD-10-CM | POA: Diagnosis not present

## 2016-07-18 DIAGNOSIS — D638 Anemia in other chronic diseases classified elsewhere: Secondary | ICD-10-CM

## 2016-07-19 ENCOUNTER — Encounter (HOSPITAL_COMMUNITY): Payer: Medicare HMO

## 2016-07-19 ENCOUNTER — Other Ambulatory Visit (HOSPITAL_COMMUNITY)
Admission: RE | Admit: 2016-07-19 | Discharge: 2016-07-19 | Disposition: A | Discharge status: Home / Self Care | Payer: Medicare HMO | Source: Ambulatory Visit | Attending: Rheumatology | Admitting: Rheumatology

## 2016-07-19 DIAGNOSIS — Z87891 Personal history of nicotine dependence: Secondary | ICD-10-CM | POA: Diagnosis not present

## 2016-07-19 DIAGNOSIS — D508 Other iron deficiency anemias: Secondary | ICD-10-CM | POA: Diagnosis not present

## 2016-07-19 DIAGNOSIS — N183 Chronic kidney disease, stage 3 (moderate): Secondary | ICD-10-CM | POA: Diagnosis not present

## 2016-07-19 DIAGNOSIS — D509 Iron deficiency anemia, unspecified: Secondary | ICD-10-CM | POA: Diagnosis not present

## 2016-07-19 DIAGNOSIS — Z7982 Long term (current) use of aspirin: Secondary | ICD-10-CM | POA: Diagnosis not present

## 2016-07-19 DIAGNOSIS — Z79899 Other long term (current) drug therapy: Secondary | ICD-10-CM | POA: Diagnosis not present

## 2016-07-19 DIAGNOSIS — D638 Anemia in other chronic diseases classified elsewhere: Secondary | ICD-10-CM

## 2016-07-19 DIAGNOSIS — Z853 Personal history of malignant neoplasm of breast: Secondary | ICD-10-CM | POA: Diagnosis not present

## 2016-07-19 LAB — COMPREHENSIVE METABOLIC PANEL
ALK PHOS: 110 U/L (ref 38–126)
ALT: 12 U/L — ABNORMAL LOW (ref 14–54)
ANION GAP: 8 (ref 5–15)
AST: 19 U/L (ref 15–41)
Albumin: 2.7 g/dL — ABNORMAL LOW (ref 3.5–5.0)
BILIRUBIN TOTAL: 0.6 mg/dL (ref 0.3–1.2)
BUN: 28 mg/dL — ABNORMAL HIGH (ref 6–20)
CALCIUM: 8.8 mg/dL — AB (ref 8.9–10.3)
CO2: 25 mmol/L (ref 22–32)
Chloride: 107 mmol/L (ref 101–111)
Creatinine, Ser: 1.15 mg/dL — ABNORMAL HIGH (ref 0.44–1.00)
GFR, EST AFRICAN AMERICAN: 52 mL/min — AB (ref >60)
GFR, EST NON AFRICAN AMERICAN: 45 mL/min — AB (ref >60)
GLUCOSE: 105 mg/dL — AB (ref 65–99)
POTASSIUM: 3.6 mmol/L (ref 3.5–5.1)
Sodium: 140 mmol/L (ref 135–145)
TOTAL PROTEIN: 7.7 g/dL (ref 6.5–8.1)

## 2016-07-19 LAB — CBC WITH DIFFERENTIAL/PLATELET
Basophils Absolute: 0 10*3/uL (ref 0.0–0.1)
Basophils Absolute: 0.1 K/uL (ref 0.0–0.1)
Basophils Relative: 1 %
Basophils Relative: 1 %
Eosinophils Absolute: 0.1 10*3/uL (ref 0.0–0.7)
Eosinophils Absolute: 0.2 K/uL (ref 0.0–0.7)
Eosinophils Relative: 2 %
Eosinophils Relative: 2 %
HCT: 27.9 % — ABNORMAL LOW (ref 36.0–46.0)
HEMATOCRIT: 28.1 % — AB (ref 36.0–46.0)
HEMOGLOBIN: 8.9 g/dL — AB (ref 12.0–15.0)
Hemoglobin: 8.8 g/dL — ABNORMAL LOW (ref 12.0–15.0)
LYMPHS ABS: 1.5 10*3/uL (ref 0.7–4.0)
Lymphocytes Relative: 22 %
Lymphocytes Relative: 24 %
Lymphs Abs: 1.4 K/uL (ref 0.7–4.0)
MCH: 23.9 pg — AB (ref 26.0–34.0)
MCH: 23.8 pg — ABNORMAL LOW (ref 26.0–34.0)
MCHC: 31.7 g/dL (ref 30.0–36.0)
MCHC: 31.5 g/dL (ref 30.0–36.0)
MCV: 75.5 fL — AB (ref 78.0–100.0)
MCV: 75.6 fL — ABNORMAL LOW (ref 78.0–100.0)
MONOS PCT: 11 %
Monocytes Absolute: 0.7 10*3/uL (ref 0.1–1.0)
Monocytes Absolute: 0.9 K/uL (ref 0.1–1.0)
Monocytes Relative: 14 %
NEUTROS ABS: 3.8 10*3/uL (ref 1.7–7.7)
NEUTROS PCT: 62 %
Neutro Abs: 3.9 K/uL (ref 1.7–7.7)
Neutrophils Relative %: 61 %
Platelets: 219 10*3/uL (ref 150–400)
Platelets: 226 K/uL (ref 150–400)
RBC: 3.69 MIL/uL — ABNORMAL LOW (ref 3.87–5.11)
RBC: 3.72 MIL/uL — ABNORMAL LOW (ref 3.87–5.11)
RDW: 21.6 % — AB (ref 11.5–15.5)
RDW: 21.6 % — ABNORMAL HIGH (ref 11.5–15.5)
WBC: 6.2 10*3/uL (ref 4.0–10.5)
WBC: 6.4 K/uL (ref 4.0–10.5)

## 2016-07-19 LAB — COMPREHENSIVE METABOLIC PANEL WITH GFR
ALT: 11 U/L — ABNORMAL LOW (ref 14–54)
AST: 18 U/L (ref 15–41)
Albumin: 2.6 g/dL — ABNORMAL LOW (ref 3.5–5.0)
Alkaline Phosphatase: 110 U/L (ref 38–126)
Anion gap: 9 (ref 5–15)
BUN: 29 mg/dL — ABNORMAL HIGH (ref 6–20)
CO2: 24 mmol/L (ref 22–32)
Calcium: 8.7 mg/dL — ABNORMAL LOW (ref 8.9–10.3)
Chloride: 106 mmol/L (ref 101–111)
Creatinine, Ser: 1.26 mg/dL — ABNORMAL HIGH (ref 0.44–1.00)
GFR calc Af Amer: 46 mL/min — ABNORMAL LOW (ref >60)
GFR calc non Af Amer: 40 mL/min — ABNORMAL LOW (ref >60)
Glucose, Bld: 103 mg/dL — ABNORMAL HIGH (ref 65–99)
Potassium: 3.6 mmol/L (ref 3.5–5.1)
Sodium: 139 mmol/L (ref 135–145)
Total Bilirubin: 0.4 mg/dL (ref 0.3–1.2)
Total Protein: 7.8 g/dL (ref 6.5–8.1)

## 2016-07-19 LAB — SAMPLE TO BLOOD BANK

## 2016-07-19 LAB — URIC ACID: Uric Acid, Serum: 13 mg/dL — ABNORMAL HIGH (ref 2.3–6.6)

## 2016-07-30 ENCOUNTER — Telehealth: Payer: Self-pay | Admitting: Rheumatology

## 2016-07-30 NOTE — Telephone Encounter (Signed)
Patient's nephew called about a gout medication he had spoken to [place] about. Please call him back, he says he might not be able to answer but will call back this afternoon if he hasn't heard anything back.

## 2016-07-30 NOTE — Telephone Encounter (Signed)
Attempted to contact [name] and left message for patient to call the office.

## 2016-07-31 NOTE — Telephone Encounter (Signed)
Attempted to contact [name] and left message for him to contact the office.

## 2016-07-31 NOTE — Telephone Encounter (Signed)
Patient was supposed to be on allopurinol based on previous records. Another doctor was treating her. Last prescription that I see in her chart is from 2015.I brought it to their attention at the last visit.Uric acid is elevated at approximately 13.She may benefit from making an appointment so we can discuss if she is or is not on allopurinol. Please asked [name] to bring all of her medicines with him so we can review it and make sure we of the medicine she isn't is not taking.There is the message that I sent when I send a message with her last labs::: #2: Uric acid is elevated at 13.0 Confirm that she is taking her medications for gout as prescribed Reviewing her record in [facility] shows that she was on allopurinol way back in 2015 of 100 mg daily. If her doctor's no longer prescribing it then we can have a discussion regarding this in the future. She has poor kidney function and we may need to use Uloric instead of allopurinol. We can discuss this in detail at the next visit or if she can make another appointment for follow-up visit regarding her elevated uric acid

## 2016-07-31 NOTE — Telephone Encounter (Signed)
Patient is not taking Allopurinol and has not been. Patient does not recall when she last took the medication. Patient has a follow up visit on 09/09/16 and her nephew wants to know if they can wait to discuss options for treatment of gout at this appointment or does she need s sooner appointment. He is also stating she is having an issue with her wrist popping out of joint and he states the patient states it has done this for years. Patient states he feels like something is not right and is going to take her to see an orthopedist.

## 2016-07-31 NOTE — Telephone Encounter (Signed)
Patient's nephew returned your call.  Thank you.

## 2016-08-01 NOTE — Telephone Encounter (Signed)
Spoke with [name] and patient has been scheduled for 08/10/16 to discuss her elevated Uric Acid. Reminded him to bring patient's medications to office with them for review.

## 2016-08-02 ENCOUNTER — Telehealth: Payer: Self-pay | Admitting: Pharmacist

## 2016-08-02 ENCOUNTER — Other Ambulatory Visit (HOSPITAL_COMMUNITY): Payer: Self-pay | Admitting: *Deleted

## 2016-08-02 DIAGNOSIS — M19072 Primary osteoarthritis, left ankle and foot: Secondary | ICD-10-CM

## 2016-08-02 DIAGNOSIS — M170 Bilateral primary osteoarthritis of knee: Secondary | ICD-10-CM | POA: Insufficient documentation

## 2016-08-02 DIAGNOSIS — M19071 Primary osteoarthritis, right ankle and foot: Secondary | ICD-10-CM | POA: Insufficient documentation

## 2016-08-02 DIAGNOSIS — D638 Anemia in other chronic diseases classified elsewhere: Secondary | ICD-10-CM

## 2016-08-02 DIAGNOSIS — Z853 Personal history of malignant neoplasm of breast: Secondary | ICD-10-CM | POA: Insufficient documentation

## 2016-08-02 NOTE — Progress Notes (Signed)
Office Visit Note  Patient: [name]             Date of Birth: 1941/01/15           MRN: [id number]             PCP: [name], MD Referring: [name], MD Visit Date: 08/10/2016 Occupation: '@GUAROCC' @    Subjective:  No chief complaint on file.   History of Present Illness: [name] is a 76 y.o. female  Patient has a history of rheumatoid arthritis and currently is on Plaquenil 200 mg daily Monday through Friday only. Also on Arava 10 mg every other day.  Although the patient is doing well, she does have some joint pain. Reviewing her chart shows that she was treated for gout by another doctor around 2015. After noticing that she is no longer on any Medication, I ordered a uric acid level and the level was approximately 13 on the most recent blood work.  As a result of all of these issues as well as being on multiple medications, we invited the patient in today with her nephew, [name], to discuss treatment of gout face-to-face.  In addition, [name] brings an x-ray done by [facility] dated August 2016 showing some erosive changes to the right ulnar styloid. Patient currently wears a brace to the right wrist and on occasion has pain and problems to that site. After we start the patient on allopurinol and colchicine, we'll reassess and see if her right wrist is doing better.  Note: Patient is hard of hearing and [name] assist her with her doctor visits, medications, etc.   Activities of Daily Living:  Patient reports morning stiffness for 30 minutes.   Patient Reports nocturnal pain.  Difficulty dressing/grooming: Reports Difficulty climbing stairs: Reports Difficulty getting out of chair: Reports Difficulty using hands for taps, buttons, cutlery, and/or writing: Reports   Review of Systems  Constitutional: Negative for fatigue.  HENT: Negative for mouth sores and mouth dryness.   Eyes: Negative for dryness.  Respiratory: Negative for shortness  of breath.   Gastrointestinal: Negative for constipation and diarrhea.  Musculoskeletal: Negative for myalgias and myalgias.  Skin: Negative for sensitivity to sunlight.  Psychiatric/Behavioral: Negative for decreased concentration and sleep disturbance.    PMFS History:  Patient Active Problem List   Diagnosis Date Noted  . History of breast cancer 08/02/2016  . Primary osteoarthritis of both knees 08/02/2016  . Primary osteoarthritis of both feet 08/02/2016  . History of anemia 07/17/2016  . History of CHF (congestive heart failure) 06/04/2016  . History of chronic kidney disease 06/04/2016  . Symptomatic anemia 06/04/2016  . Elevated sedimentation rate 03/03/2016  . Idiopathic chronic gout of multiple sites without tophus 03/03/2016  . Total knee replacement status, right 03/03/2016  . High risk medication use 03/03/2016  . Chronic kidney disease (CKD) stage G3b/A1, moderately decreased glomerular filtration rate (GFR) between 30-44 mL/min/1.73 square meter and albuminuria creatinine ratio less than 30 mg/g 02/01/2016  . CAD S/P remote PCI- no details 09/02/2014  . Cardiomyopathy, ischemic-EF 30-35% March 2015 09/02/2014  . Diastolic dysfunction-grade 2 09/02/2014  . CHF exacerbation ([place]) 08/28/2014  . Acute on chronic combined systolic and diastolic congestive heart failure ([place]) 08/28/2014  . Iron deficiency anemia 08/20/2013  . Malnutrition of moderate degree ([place]) 07/21/2013  . PNA (pneumonia) 07/19/2013  . HCAP (healthcare-associated pneumonia) 07/17/2013  . Sepsis ([place]) 07/17/2013  . ARF (acute renal failure) ([place]) 07/17/2013  . Rheumatoid arthritis ([place])  07/04/2013  . Hypokalemia 07/03/2013  . Chest pain 07/03/2013  . Biventricular ICD in place (MDT 2014) 11/06/2012  . Anemia of chronic disease   . Breast cancer ([place])   . Hypertension   . Dyslipidemia 05/24/2009  . Chronic systolic heart failure ([place]) 05/24/2009  . Primary osteoarthritis of both hands 08/11/2007      Past Medical History:  Diagnosis Date  . Anemia    Hgb of 9-10  . Anemia of chronic disease    Hgb of 9-10 chronically; 06/2010: H&H-10.7/33.5, MCV-81, normal iron studies in 2010   . Arteriosclerotic cardiovascular disease (ASCVD)    Remote PTCA by patient report; LBBB; associated cardiomyopathy, presumed ischemic with EF 40-45% previously, 20% in 06/2009; h/o clinical congestive heart failure; negative stress nuclear in 2009 with inferoseptal and apical scar  . Automatic implantable cardioverter-defibrillator in situ   . Breast cancer ([place])    breast  . Chronic bronchitis ([place])   . Chronic renal disease, stage 3, moderately decreased glomerular filtration rate (GFR) between 30-59 mL/min/1.73 square meter 02/01/2016  . Congestive heart failure (CHF) ([place])   . Elevated cholesterol   . Elevated sed rate   . GERD (gastroesophageal reflux disease)   . Gout   . HOH (hard of hearing)   . Hyperlipidemia   . Hypertension   . Rheumatoid arthritis ([place])     Family History  Problem Relation Age of Onset  . Diabetes Father   . Pancreatic cancer Father   . Hypertension Brother    Past Surgical History:  Procedure Laterality Date  . BI-VENTRICULAR IMPLANTABLE CARDIOVERTER DEFIBRILLATOR N/A 07/28/2012   Procedure: BI-VENTRICULAR IMPLANTABLE CARDIOVERTER DEFIBRILLATOR  (CRT-D);  Surgeon: Evans Lance, MD;  Location: [facility] CATH LAB;  Service: Cardiovascular;  Laterality: N/A;  . BI-VENTRICULAR IMPLANTABLE CARDIOVERTER DEFIBRILLATOR  (CRT-D)  07/28/2012  . BREAST BIOPSY Bilateral   . CATARACT EXTRACTION W/ INTRAOCULAR LENS IMPLANT Left   . CATARACT EXTRACTION W/PHACO Right 09/15/2013   Procedure: CATARACT EXTRACTION PHACO AND INTRAOCULAR LENS PLACEMENT (IOC);  Surgeon: Elta Guadeloupe T. Gershon Crane, MD;  Location: [facility] ORS;  Service: Ophthalmology;  Laterality: Right;  CDE:  10.74  . COLONOSCOPY N/A 03/04/2015   Procedure: COLONOSCOPY;  Surgeon: Rogene Houston, MD;  Location: [facility] ENDO SUITE;  Service: Endoscopy;   Laterality: N/A;  10:50   . ESOPHAGOGASTRODUODENOSCOPY N/A 03/04/2015   Procedure: ESOPHAGOGASTRODUODENOSCOPY (EGD);  Surgeon: Rogene Houston, MD;  Location: [facility] ENDO SUITE;  Service: Endoscopy;  Laterality: N/A;  . MASTECTOMY Left 1998  . TOTAL KNEE ARTHROPLASTY Right    Dr.Harrison  . TUBAL LIGATION     Social History   Social History Narrative   Married, lives with spouse   1 daughter, died in 67 in a car accident   Retired - worked in [employer]   No recent travel    Current Outpatient Prescriptions on File Prior to Visit  Medication Sig Dispense Refill  . acetaminophen (TYLENOL) 500 MG tablet Take 1,000 mg by mouth every 6 (six) hours as needed.     [name] aspirin EC 81 MG tablet Take 81 mg by mouth daily.    . carvedilol (COREG) 6.25 MG tablet TAKE 1 TABLET BY MOUTH TWICE A DAY WITH MEALS 60 tablet 6  . furosemide (LASIX) 40 MG tablet Take 1 tablet by mouth twice a day.  May take extra 40 mg for weight gain of 3 lbs in 24 hrs 90 tablet 3  . hydroxychloroquine (PLAQUENIL) 200 MG tablet Take 1 tablet (200 mg total)  by mouth daily. Monday-Friday 90 tablet 0  . KLOR-CON M10 10 MEQ tablet TAKE 1 TABLET BY MOUTH TWICE A DAY 60 tablet 6  . leflunomide (ARAVA) 10 MG tablet TAKE 1 TABLET (10 MG TOTAL) BY MOUTH DAILY. (Patient taking differently: Take 10 mg by mouth every other day. ) 30 tablet 0  . lisinopril (PRINIVIL,ZESTRIL) 20 MG tablet Take 10 mg by mouth daily.     [name] lovastatin (MEVACOR) 40 MG tablet TAKE 1 TABLET (40 MG TOTAL) BY MOUTH AT BEDTIME. 30 tablet 6  . omeprazole (PRILOSEC) 20 MG capsule Take 20 mg by mouth daily. For gas/heartburn    . folic acid (FOLVITE) 1 MG tablet Take 1 mg by mouth daily.    [name] PROAIR HFA 108 (90 BASE) MCG/ACT inhaler Inhale 2 puffs into the lungs every 4 (four) hours as needed for wheezing or shortness of breath. Reported on 08/26/2015     No current facility-administered medications on file prior to visit.      Objective: Vital Signs: BP (!) 123/55  (BP Location: Right Arm, Patient Position: Sitting, Cuff Size: Normal)   Wt 146 lb (66.2 kg)   BMI 25.86 kg/m    Physical Exam  Constitutional: She is oriented to person, place, and time. She appears well-developed and well-nourished.  HENT:  Head: Normocephalic and atraumatic.  Eyes: EOM are normal. Pupils are equal, round, and reactive to light.  Cardiovascular: Normal rate, regular rhythm and normal heart sounds.  Exam reveals no gallop and no friction rub.   No murmur heard. Pulmonary/Chest: Effort normal and breath sounds normal. She has no wheezes. She has no rales.  Abdominal: Soft. Bowel sounds are normal. She exhibits no distension. There is no tenderness. There is no guarding. No hernia.  Musculoskeletal: Normal range of motion. She exhibits no edema, tenderness or deformity.  Lymphadenopathy:    She has no cervical adenopathy.  Neurological: She is alert and oriented to person, place, and time. Coordination normal.  Skin: Skin is warm and dry. Capillary refill takes less than 2 seconds. No rash noted.  Psychiatric: She has a normal mood and affect. Her behavior is normal.  Nursing note and vitals reviewed.    Musculoskeletal Exam:  Full range of motion of all joints Grip strength is equal and strong bilaterally except decreased extension/flexion of bilateral wrists Fibromyalgia tender points are absent.  CDAI Exam: CDAI Homunculus Exam:   Tenderness:  RUE: wrist LUE: wrist  Swelling:  RUE: wrist LUE: wrist  Joint Counts:  CDAI Tender Joint count: 2 CDAI Swollen Joint count: 2  Global Assessments:  Patient Global Assessment: 5 Provider Global Assessment: 5  CDAI Calculated Score: 14    Investigation: No additional findings.  Appointment on 08/08/2016  Component Date Value Ref Range Status  . Erythropoietin 08/08/2016 44.1* 2.6 - 18.5 mIU/mL Final   Comment: (NOTE) Siemens Immulite 2000 Immunochemiluminometric assay [facility]) Performed At: [facility] [place], [place] [id number] Lindon Romp MD SW:[id number]   . WBC 08/08/2016 5.5  4.0 - 10.5 K/uL Final  . RBC 08/08/2016 3.68* 3.87 - 5.11 MIL/uL Final  . Hemoglobin 08/08/2016 8.7* 12.0 - 15.0 g/dL Final  . HCT 08/08/2016 28.0* 36.0 - 46.0 % Final  . MCV 08/08/2016 76.1* 78.0 - 100.0 fL Final  . MCH 08/08/2016 23.6* 26.0 - 34.0 pg Final  . MCHC 08/08/2016 31.1  30.0 - 36.0 g/dL Final  . RDW 08/08/2016 21.5* 11.5 - 15.5 % Final  . Platelets 08/08/2016  260  150 - 400 K/uL Final  . Neutrophils Relative % 08/08/2016 54  % Final  . Neutro Abs 08/08/2016 3.0  1.7 - 7.7 K/uL Final  . Lymphocytes Relative 08/08/2016 28  % Final  . Lymphs Abs 08/08/2016 1.5  0.7 - 4.0 K/uL Final  . Monocytes Relative 08/08/2016 14  % Final  . Monocytes Absolute 08/08/2016 0.8  0.1 - 1.0 K/uL Final  . Eosinophils Relative 08/08/2016 3  % Final  . Eosinophils Absolute 08/08/2016 0.2  0.0 - 0.7 K/uL Final  . Basophils Relative 08/08/2016 1  % Final  . Basophils Absolute 08/08/2016 0.1  0.0 - 0.1 K/uL Final  . Ferritin 08/08/2016 283  11 - 307 ng/mL Final  . Iron 08/08/2016 30  28 - 170 ug/dL Final  . TIBC 08/08/2016 167* 250 - 450 ug/dL Final  . Saturation Ratios 08/08/2016 18  10.4 - 31.8 % Final  . UIBC 08/08/2016 137  ug/dL Final  . Sodium 08/08/2016 136  135 - 145 mmol/L Final  . Potassium 08/08/2016 3.6  3.5 - 5.1 mmol/L Final  . Chloride 08/08/2016 102  101 - 111 mmol/L Final  . CO2 08/08/2016 24  22 - 32 mmol/L Final  . Glucose, Bld 08/08/2016 101* 65 - 99 mg/dL Final  . BUN 08/08/2016 24* 6 - 20 mg/dL Final  . Creatinine, Ser 08/08/2016 1.10* 0.44 - 1.00 mg/dL Final  . Calcium 08/08/2016 8.8* 8.9 - 10.3 mg/dL Final  . Total Protein 08/08/2016 7.7  6.5 - 8.1 g/dL Final  . Albumin 08/08/2016 2.7* 3.5 - 5.0 g/dL Final  . AST 08/08/2016 18  15 - 41 U/L Final  . ALT 08/08/2016 12* 14 - 54 U/L Final  . Alkaline Phosphatase 08/08/2016 110  38 - 126 U/L Final  . Total  Bilirubin 08/08/2016 0.1* 0.3 - 1.2 mg/dL Final  . GFR calc non Af Amer 08/08/2016 47* >60 mL/min Final  . GFR calc Af Amer 08/08/2016 55* >60 mL/min Final   Comment: (NOTE) The eGFR has been calculated using the CKD EPI equation. This calculation has not been validated in all clinical situations. eGFR's persistently <60 mL/min signify possible Chronic Kidney Disease.   . Anion gap 08/08/2016 10  5 - 15 Final  Appointment on 08/03/2016  Component Date Value Ref Range Status  . Blood Bank Specimen 08/03/2016 SAMPLE AVAILABLE FOR TESTING   Final  . Sample Expiration 08/03/2016 08/06/2016   Final  . WBC 08/03/2016 5.6  4.0 - 10.5 K/uL Final  . RBC 08/03/2016 3.49* 3.87 - 5.11 MIL/uL Final  . Hemoglobin 08/03/2016 8.3* 12.0 - 15.0 g/dL Final  . HCT 08/03/2016 26.3* 36.0 - 46.0 % Final  . MCV 08/03/2016 75.4* 78.0 - 100.0 fL Final  . MCH 08/03/2016 23.8* 26.0 - 34.0 pg Final  . MCHC 08/03/2016 31.6  30.0 - 36.0 g/dL Final  . RDW 08/03/2016 21.6* 11.5 - 15.5 % Final  . Platelets 08/03/2016 232  150 - 400 K/uL Final  . Neutrophils Relative % 08/03/2016 63  % Final  . Neutro Abs 08/03/2016 3.5  1.7 - 7.7 K/uL Final  . Lymphocytes Relative 08/03/2016 21  % Final  . Lymphs Abs 08/03/2016 1.2  0.7 - 4.0 K/uL Final  . Monocytes Relative 08/03/2016 14  % Final  . Monocytes Absolute 08/03/2016 0.8  0.1 - 1.0 K/uL Final  . Eosinophils Relative 08/03/2016 2  % Final  . Eosinophils Absolute 08/03/2016 0.1  0.0 - 0.7 K/uL Final  . Basophils  Relative 08/03/2016 1  % Final  . Basophils Absolute 08/03/2016 0.0  0.0 - 0.1 K/uL Final  . Smear Review 08/03/2016 MORPHOLOGY UNREMARKABLE   Final  Hospital Outpatient Visit on 07/19/2016  Component Date Value Ref Range Status  . WBC 07/19/2016 6.4  4.0 - 10.5 K/uL Final  . RBC 07/19/2016 3.69* 3.87 - 5.11 MIL/uL Final  . Hemoglobin 07/19/2016 8.8* 12.0 - 15.0 g/dL Final  . HCT 07/19/2016 27.9* 36.0 - 46.0 % Final  . MCV 07/19/2016 75.6* 78.0 - 100.0 fL  Final  . MCH 07/19/2016 23.8* 26.0 - 34.0 pg Final  . MCHC 07/19/2016 31.5  30.0 - 36.0 g/dL Final  . RDW 07/19/2016 21.6* 11.5 - 15.5 % Final  . Platelets 07/19/2016 226  150 - 400 K/uL Final  . Neutrophils Relative % 07/19/2016 61  % Final  . Neutro Abs 07/19/2016 3.9  1.7 - 7.7 K/uL Final  . Lymphocytes Relative 07/19/2016 22  % Final  . Lymphs Abs 07/19/2016 1.4  0.7 - 4.0 K/uL Final  . Monocytes Relative 07/19/2016 14  % Final  . Monocytes Absolute 07/19/2016 0.9  0.1 - 1.0 K/uL Final  . Eosinophils Relative 07/19/2016 2  % Final  . Eosinophils Absolute 07/19/2016 0.2  0.0 - 0.7 K/uL Final  . Basophils Relative 07/19/2016 1  % Final  . Basophils Absolute 07/19/2016 0.1  0.0 - 0.1 K/uL Final  . Sodium 07/19/2016 139  135 - 145 mmol/L Final  . Potassium 07/19/2016 3.6  3.5 - 5.1 mmol/L Final  . Chloride 07/19/2016 106  101 - 111 mmol/L Final  . CO2 07/19/2016 24  22 - 32 mmol/L Final  . Glucose, Bld 07/19/2016 103* 65 - 99 mg/dL Final  . BUN 07/19/2016 29* 6 - 20 mg/dL Final  . Creatinine, Ser 07/19/2016 1.26* 0.44 - 1.00 mg/dL Final  . Calcium 07/19/2016 8.7* 8.9 - 10.3 mg/dL Final  . Total Protein 07/19/2016 7.8  6.5 - 8.1 g/dL Final  . Albumin 07/19/2016 2.6* 3.5 - 5.0 g/dL Final  . AST 07/19/2016 18  15 - 41 U/L Final  . ALT 07/19/2016 11* 14 - 54 U/L Final  . Alkaline Phosphatase 07/19/2016 110  38 - 126 U/L Final  . Total Bilirubin 07/19/2016 0.4  0.3 - 1.2 mg/dL Final  . GFR calc non Af Amer 07/19/2016 40* >60 mL/min Final  . GFR calc Af Amer 07/19/2016 46* >60 mL/min Final   Comment: (NOTE) The eGFR has been calculated using the CKD EPI equation. This calculation has not been validated in all clinical situations. eGFR's persistently <60 mL/min signify possible Chronic Kidney Disease.   . Anion gap 07/19/2016 9  5 - 15 Final  . Uric Acid, Serum 07/19/2016 13.0* 2.3 - 6.6 mg/dL Final  Appointment on 07/19/2016  Component Date Value Ref Range Status  . Blood Bank  Specimen 07/19/2016 SAMPLE AVAILABLE FOR TESTING   Final  . Sample Expiration 07/19/2016 07/22/2016   Final  . WBC 07/19/2016 6.2  4.0 - 10.5 K/uL Final  . RBC 07/19/2016 3.72* 3.87 - 5.11 MIL/uL Final  . Hemoglobin 07/19/2016 8.9* 12.0 - 15.0 g/dL Final  . HCT 07/19/2016 28.1* 36.0 - 46.0 % Final  . MCV 07/19/2016 75.5* 78.0 - 100.0 fL Final  . MCH 07/19/2016 23.9* 26.0 - 34.0 pg Final  . MCHC 07/19/2016 31.7  30.0 - 36.0 g/dL Final  . RDW 07/19/2016 21.6* 11.5 - 15.5 % Final  . Platelets 07/19/2016 219  150 - 400 K/uL  Final  . Neutrophils Relative % 07/19/2016 62  % Final  . Neutro Abs 07/19/2016 3.8  1.7 - 7.7 K/uL Final  . Lymphocytes Relative 07/19/2016 24  % Final  . Lymphs Abs 07/19/2016 1.5  0.7 - 4.0 K/uL Final  . Monocytes Relative 07/19/2016 11  % Final  . Monocytes Absolute 07/19/2016 0.7  0.1 - 1.0 K/uL Final  . Eosinophils Relative 07/19/2016 2  % Final  . Eosinophils Absolute 07/19/2016 0.1  0.0 - 0.7 K/uL Final  . Basophils Relative 07/19/2016 1  % Final  . Basophils Absolute 07/19/2016 0.0  0.0 - 0.1 K/uL Final  . Sodium 07/19/2016 140  135 - 145 mmol/L Final  . Potassium 07/19/2016 3.6  3.5 - 5.1 mmol/L Final  . Chloride 07/19/2016 107  101 - 111 mmol/L Final  . CO2 07/19/2016 25  22 - 32 mmol/L Final  . Glucose, Bld 07/19/2016 105* 65 - 99 mg/dL Final  . BUN 07/19/2016 28* 6 - 20 mg/dL Final  . Creatinine, Ser 07/19/2016 1.15* 0.44 - 1.00 mg/dL Final  . Calcium 07/19/2016 8.8* 8.9 - 10.3 mg/dL Final  . Total Protein 07/19/2016 7.7  6.5 - 8.1 g/dL Final  . Albumin 07/19/2016 2.7* 3.5 - 5.0 g/dL Final  . AST 07/19/2016 19  15 - 41 U/L Final  . ALT 07/19/2016 12* 14 - 54 U/L Final  . Alkaline Phosphatase 07/19/2016 110  38 - 126 U/L Final  . Total Bilirubin 07/19/2016 0.6  0.3 - 1.2 mg/dL Final  . GFR calc non Af Amer 07/19/2016 45* >60 mL/min Final  . GFR calc Af Amer 07/19/2016 52* >60 mL/min Final   Comment: (NOTE) The eGFR has been calculated using the CKD  EPI equation. This calculation has not been validated in all clinical situations. eGFR's persistently <60 mL/min signify possible Chronic Kidney Disease.   . Anion gap 07/19/2016 8  5 - 15 Final     Imaging: No results found.  Speciality Comments: No specialty comments available.    Procedures:  No procedures performed Allergies: Patient has no known allergies.   Assessment / Plan:     Visit Diagnoses: Idiopathic chronic gout of multiple sites without tophus - Plan: Uric acid  Rheumatoid arthritis involving multiple sites with positive rheumatoid factor (HCC)  High risk medication use - Arava 10 mg by mouth daily, Plaquenil 200 mg by mouth daily Monday to Friday  Anemia of chronic disease  Chronic kidney disease (CKD) stage G3b/A1  Primary osteoarthritis of both hands  Primary osteoarthritis of both knees  Total knee replacement status, right  Primary osteoarthritis of both feet  History of CHF (congestive heart failure)  History of breast cancer  History of breast cancer  Biventricular ICD in place (MDT 4827)  Chronic systolic heart failure ([place])    #1:Rheumatoid arthritis. We have added Arava to the patient's medications. She has been taking 10 mg every other day. We observed this schedule because patient has a history of anemia and Arava can worsen the anemia. She's here for recheck.  #2: High-risk prescription Arava 22m every other day (due to risk of making anemia worse therefore we are being cautious with dose.  Patient is taking Plaquenil at 200 mg daily M-F only  # Gout Restart allopurinol 100 mg in 2 weeks and take every day. [facility] continue qd.order placed w/ local pharmacy in [place].  Return as scheduled in June 2018 and we will do labs at office  visit: Cbc w/ diff, cmp w/ gfr, uric acid at June office visit  Orders: Orders Placed This Encounter  Procedures  . Uric acid   Meds ordered  this encounter  Medications  . colchicine 0.6 MG tablet    Sig: Take 1 tablet (0.6 mg total) by mouth daily.    Dispense:  30 tablet    Refill:  2    Order Specific Question:   Supervising Provider    Answer:   Bo Merino [[id number]]  . allopurinol (ZYLOPRIM) 100 MG tablet    Sig: Take 1 tablet (100 mg total) by mouth daily.    Dispense:  90 tablet    Refill:  0    Order Specific Question:   Supervising Provider    Answer:   Bo Merino [phone]    Face-to-face time spent with patient was 30 minutes. 50% of time was spent in counseling and coordination of care.  Follow-Up Instructions: Return in about 2 months (around 10/10/2016), or cancel may 2018, for gout (start tx); wrist pain, ra, plq 200 m-f only,; arava 76m qod.   NEliezer Lofts PA-C  Note - This record has been created using [employer]  Chart creation errors have been sought, but may not always  have been located. Such creation errors do not reflect on  the standard of medical care.

## 2016-08-02 NOTE — Telephone Encounter (Signed)
-----   Message from Holley Bouche, NP sent at 07/24/2016 12:55 PM EDT ----- Regarding: RE: Leflunomide and Anemia Hi there,   I apologize for the delayed response, as I was out of town for the holiday.  Thank you so much for making [place] aware of the Leflunomide. Certainly the Wandra Mannan could be contributing to the anemia, but her anemia diagnosis pre-dates the use of this medication. She has a component of iron deficiency, which we are managing with intermittent IV iron infusions.  We will certainly keep a close watch on her counts, as we too want to ensure her RA is well-controlled. Thanks again for reaching out for [place] and keep [place] posted if there's anything we can do to help care for this lady.   Mike Craze, NP [place] [phone]  ----- Message ----- From: Cyndia Skeeters, [facility] Sent: 07/19/2016   5:29 PM To: Holley Bouche, NP Subject: Leflunomide and Anemia                         Mrs. Lindi Adie was started on [place] in January of 2018.  Do you feel this could be contributing to her anemia?  Although we want to control her rheumatoid arthritis as it can cause anemia of chronic disease.   Sincerely,   Elisabeth Most, Pharm.D., BCPS, CPP Clinical Pharmacist 07/19/2016 5:31 PM

## 2016-08-03 ENCOUNTER — Telehealth (HOSPITAL_COMMUNITY): Payer: Self-pay | Admitting: *Deleted

## 2016-08-03 ENCOUNTER — Other Ambulatory Visit (HOSPITAL_COMMUNITY): Payer: Self-pay | Admitting: Adult Health

## 2016-08-03 ENCOUNTER — Encounter (HOSPITAL_COMMUNITY): Payer: Medicare HMO | Attending: Oncology

## 2016-08-03 DIAGNOSIS — D649 Anemia, unspecified: Secondary | ICD-10-CM | POA: Diagnosis not present

## 2016-08-03 DIAGNOSIS — N183 Chronic kidney disease, stage 3 (moderate): Secondary | ICD-10-CM

## 2016-08-03 DIAGNOSIS — D508 Other iron deficiency anemias: Secondary | ICD-10-CM | POA: Insufficient documentation

## 2016-08-03 DIAGNOSIS — D638 Anemia in other chronic diseases classified elsewhere: Secondary | ICD-10-CM | POA: Insufficient documentation

## 2016-08-03 DIAGNOSIS — N1832 Chronic kidney disease, stage 3b: Secondary | ICD-10-CM

## 2016-08-03 LAB — CBC WITH DIFFERENTIAL/PLATELET
BASOS ABS: 0 10*3/uL (ref 0.0–0.1)
BASOS PCT: 1 %
EOS ABS: 0.1 10*3/uL (ref 0.0–0.7)
EOS PCT: 2 %
HCT: 26.3 % — ABNORMAL LOW (ref 36.0–46.0)
Hemoglobin: 8.3 g/dL — ABNORMAL LOW (ref 12.0–15.0)
LYMPHS ABS: 1.2 10*3/uL (ref 0.7–4.0)
Lymphocytes Relative: 21 %
MCH: 23.8 pg — ABNORMAL LOW (ref 26.0–34.0)
MCHC: 31.6 g/dL (ref 30.0–36.0)
MCV: 75.4 fL — AB (ref 78.0–100.0)
Monocytes Absolute: 0.8 10*3/uL (ref 0.1–1.0)
Monocytes Relative: 14 %
Neutro Abs: 3.5 10*3/uL (ref 1.7–7.7)
Neutrophils Relative %: 63 %
PLATELETS: 232 10*3/uL (ref 150–400)
RBC: 3.49 MIL/uL — ABNORMAL LOW (ref 3.87–5.11)
RDW: 21.6 % — AB (ref 11.5–15.5)
WBC: 5.6 10*3/uL (ref 4.0–10.5)

## 2016-08-03 LAB — SAMPLE TO BLOOD BANK

## 2016-08-03 NOTE — Telephone Encounter (Signed)
Pt's Hgb today is down to 8.3. Pt aware of results. Pt denies any chest pain or shortness of breath. Pt states that she is tired all of the time but she just works through it. Mike Craze NP and Dr. Talbert Cage aware of results and symptoms,they advise pt to have labs redrawn mid week next week and decide then wether she needs blood or iron infusion.  Pt was advised of above and also advised that if she started having chest pain, shortness of breath that she needed to go straight to the ED. Pt verbalized understanding.

## 2016-08-06 ENCOUNTER — Ambulatory Visit (INDEPENDENT_AMBULATORY_CARE_PROVIDER_SITE_OTHER): Payer: Medicare HMO | Admitting: *Deleted

## 2016-08-06 ENCOUNTER — Telehealth: Payer: Self-pay | Admitting: Internal Medicine

## 2016-08-06 ENCOUNTER — Telehealth: Payer: Self-pay | Admitting: Cardiology

## 2016-08-06 DIAGNOSIS — I255 Ischemic cardiomyopathy: Secondary | ICD-10-CM

## 2016-08-06 NOTE — Telephone Encounter (Signed)
LMOVM reminding pt to send remote transmission.   

## 2016-08-06 NOTE — Telephone Encounter (Signed)
New message   Pt nephew is retuning call to [facility]

## 2016-08-07 NOTE — Telephone Encounter (Signed)
Spoke w/ pt nephew he will send remote transmission once pt has power again.

## 2016-08-07 NOTE — Telephone Encounter (Signed)
LMOVM for pt nephew to call back.

## 2016-08-08 ENCOUNTER — Encounter (HOSPITAL_COMMUNITY): Payer: Medicare HMO

## 2016-08-08 DIAGNOSIS — N183 Chronic kidney disease, stage 3 (moderate): Secondary | ICD-10-CM

## 2016-08-08 DIAGNOSIS — D649 Anemia, unspecified: Secondary | ICD-10-CM

## 2016-08-08 DIAGNOSIS — D508 Other iron deficiency anemias: Secondary | ICD-10-CM | POA: Diagnosis not present

## 2016-08-08 DIAGNOSIS — N1832 Chronic kidney disease, stage 3b: Secondary | ICD-10-CM

## 2016-08-08 DIAGNOSIS — D638 Anemia in other chronic diseases classified elsewhere: Secondary | ICD-10-CM | POA: Diagnosis not present

## 2016-08-08 LAB — COMPREHENSIVE METABOLIC PANEL
ALT: 12 U/L — ABNORMAL LOW (ref 14–54)
ANION GAP: 10 (ref 5–15)
AST: 18 U/L (ref 15–41)
Albumin: 2.7 g/dL — ABNORMAL LOW (ref 3.5–5.0)
Alkaline Phosphatase: 110 U/L (ref 38–126)
BUN: 24 mg/dL — ABNORMAL HIGH (ref 6–20)
CO2: 24 mmol/L (ref 22–32)
CREATININE: 1.1 mg/dL — AB (ref 0.44–1.00)
Calcium: 8.8 mg/dL — ABNORMAL LOW (ref 8.9–10.3)
Chloride: 102 mmol/L (ref 101–111)
GFR, EST AFRICAN AMERICAN: 55 mL/min — AB (ref >60)
GFR, EST NON AFRICAN AMERICAN: 47 mL/min — AB (ref >60)
Glucose, Bld: 101 mg/dL — ABNORMAL HIGH (ref 65–99)
Potassium: 3.6 mmol/L (ref 3.5–5.1)
SODIUM: 136 mmol/L (ref 135–145)
Total Bilirubin: 0.1 mg/dL — ABNORMAL LOW (ref 0.3–1.2)
Total Protein: 7.7 g/dL (ref 6.5–8.1)

## 2016-08-08 LAB — CBC WITH DIFFERENTIAL/PLATELET
BASOS ABS: 0.1 10*3/uL (ref 0.0–0.1)
BASOS PCT: 1 %
EOS ABS: 0.2 10*3/uL (ref 0.0–0.7)
Eosinophils Relative: 3 %
HEMATOCRIT: 28 % — AB (ref 36.0–46.0)
HEMOGLOBIN: 8.7 g/dL — AB (ref 12.0–15.0)
Lymphocytes Relative: 28 %
Lymphs Abs: 1.5 10*3/uL (ref 0.7–4.0)
MCH: 23.6 pg — ABNORMAL LOW (ref 26.0–34.0)
MCHC: 31.1 g/dL (ref 30.0–36.0)
MCV: 76.1 fL — ABNORMAL LOW (ref 78.0–100.0)
MONOS PCT: 14 %
Monocytes Absolute: 0.8 10*3/uL (ref 0.1–1.0)
NEUTROS ABS: 3 10*3/uL (ref 1.7–7.7)
NEUTROS PCT: 54 %
Platelets: 260 10*3/uL (ref 150–400)
RBC: 3.68 MIL/uL — AB (ref 3.87–5.11)
RDW: 21.5 % — ABNORMAL HIGH (ref 11.5–15.5)
WBC: 5.5 10*3/uL (ref 4.0–10.5)

## 2016-08-08 LAB — IRON AND TIBC
IRON: 30 ug/dL (ref 28–170)
SATURATION RATIOS: 18 % (ref 10.4–31.8)
TIBC: 167 ug/dL — AB (ref 250–450)
UIBC: 137 ug/dL

## 2016-08-08 LAB — FERRITIN: FERRITIN: 283 ng/mL (ref 11–307)

## 2016-08-09 LAB — ERYTHROPOIETIN: Erythropoietin: 44.1 m[IU]/mL — ABNORMAL HIGH (ref 2.6–18.5)

## 2016-08-10 ENCOUNTER — Encounter: Payer: Self-pay | Admitting: Rheumatology

## 2016-08-10 ENCOUNTER — Ambulatory Visit: Payer: Medicare HMO | Admitting: Rheumatology

## 2016-08-10 ENCOUNTER — Ambulatory Visit (INDEPENDENT_AMBULATORY_CARE_PROVIDER_SITE_OTHER): Payer: Medicare HMO | Admitting: Rheumatology

## 2016-08-10 VITALS — BP 123/55 | Wt 146.0 lb

## 2016-08-10 DIAGNOSIS — M0579 Rheumatoid arthritis with rheumatoid factor of multiple sites without organ or systems involvement: Secondary | ICD-10-CM

## 2016-08-10 DIAGNOSIS — N183 Chronic kidney disease, stage 3 (moderate): Secondary | ICD-10-CM

## 2016-08-10 DIAGNOSIS — M19042 Primary osteoarthritis, left hand: Secondary | ICD-10-CM

## 2016-08-10 DIAGNOSIS — C50919 Malignant neoplasm of unspecified site of unspecified female breast: Secondary | ICD-10-CM | POA: Diagnosis not present

## 2016-08-10 DIAGNOSIS — Z79899 Other long term (current) drug therapy: Secondary | ICD-10-CM | POA: Diagnosis not present

## 2016-08-10 DIAGNOSIS — M170 Bilateral primary osteoarthritis of knee: Secondary | ICD-10-CM

## 2016-08-10 DIAGNOSIS — Z853 Personal history of malignant neoplasm of breast: Secondary | ICD-10-CM | POA: Diagnosis not present

## 2016-08-10 DIAGNOSIS — M19071 Primary osteoarthritis, right ankle and foot: Secondary | ICD-10-CM

## 2016-08-10 DIAGNOSIS — N1832 Chronic kidney disease, stage 3b: Secondary | ICD-10-CM

## 2016-08-10 DIAGNOSIS — Z96651 Presence of right artificial knee joint: Secondary | ICD-10-CM | POA: Diagnosis not present

## 2016-08-10 DIAGNOSIS — M1A09X0 Idiopathic chronic gout, multiple sites, without tophus (tophi): Secondary | ICD-10-CM | POA: Diagnosis not present

## 2016-08-10 DIAGNOSIS — Z8679 Personal history of other diseases of the circulatory system: Secondary | ICD-10-CM | POA: Diagnosis not present

## 2016-08-10 DIAGNOSIS — D638 Anemia in other chronic diseases classified elsewhere: Secondary | ICD-10-CM

## 2016-08-10 DIAGNOSIS — M19041 Primary osteoarthritis, right hand: Secondary | ICD-10-CM

## 2016-08-10 DIAGNOSIS — I5022 Chronic systolic (congestive) heart failure: Secondary | ICD-10-CM

## 2016-08-10 DIAGNOSIS — Z95810 Presence of automatic (implantable) cardiac defibrillator: Secondary | ICD-10-CM

## 2016-08-10 DIAGNOSIS — M19072 Primary osteoarthritis, left ankle and foot: Secondary | ICD-10-CM

## 2016-08-10 MED ORDER — COLCHICINE 0.6 MG PO TABS: 0.6000 mg | ORAL_TABLET | Freq: Every day | ORAL | 2 refills | Status: DC

## 2016-08-10 MED ORDER — ALLOPURINOL 100 MG PO TABS
100.0000 mg | ORAL_TABLET | Freq: Every day | ORAL | 0 refills | Status: DC
Start: 2016-08-21 — End: 2016-12-07

## 2016-08-10 NOTE — Patient Instructions (Signed)
  Standing Labs We placed an order today for your standing lab work.    Please come back and get your standing labs in  June 2018 ==> cbc w/ diff, cmp w/ gfr, and uric acid  We have open lab Monday through Friday from 8:30-11:30 AM and 1:30-4 PM at the office of Dr. Tresa Moore, PA.   The office is located at [address], [place], [place], [place] [address] No appointment is necessary.   Labs are drawn by [facility].  You may receive a bill from [place] for your lab work.

## 2016-08-13 ENCOUNTER — Telehealth (INDEPENDENT_AMBULATORY_CARE_PROVIDER_SITE_OTHER): Payer: Self-pay | Admitting: Rheumatology

## 2016-08-13 NOTE — Telephone Encounter (Signed)
Colchicine / allopurinol are expensive.   I could try patient assistance with the colchicine. Will call him to advise

## 2016-08-13 NOTE — Telephone Encounter (Signed)
Patients nephew called in reference to two medications called in for patient. Meds too expensive. Request different medicine if possible. Nephew did not know name of meds.Uses [facility] and one mail order.

## 2016-08-14 NOTE — Telephone Encounter (Signed)
Attempted to call patient's nephew [name] to discuss medication cost.  Per [email or web address], allopurinol should be free through mail order, however, colchicine price is ~$200 (confirmed with Chase at [facility]).  Robert did not answer.  I left a message asking him to call me back.   If patient is willing, I can assist in applying for Colcrys patient assistance program to get medication free from the manufacturer.  She will need to sign application form and provide proof of income.  I will also need provider form completed.  I have laid provider portion on your desk.  Okay to apply for Colcrys patient assistance program?

## 2016-08-14 NOTE — Telephone Encounter (Signed)
Thanks, That would be great to get them pt patient assistance for colcrys. 90 day supply w/ 3 refills should work well.  I signed form.

## 2016-08-14 NOTE — Telephone Encounter (Signed)
Ok to give samples of Mitigare samples as needed till pt gets patient assistance approved.

## 2016-08-14 NOTE — Telephone Encounter (Signed)
Received a call from [name], patient's nephew.  Discussed applying for [facility] patient assistance program for Colcrys.  Advised that we would need patient to complete a form and the program is income based and we would need a copy of patient's income information to submit with the form.  Also advised that we could provide patient with sample while the form is processing.  [name] was at work and will talk to Mrs. Wassmer about the application and will call me back to let me know if she wants to apply for the program.    Elisabeth Most, Pharm.D., BCPS, CPP Clinical Pharmacist Pager: [phone] Phone: [phone] 08/14/2016 4:52 PM

## 2016-08-14 NOTE — Telephone Encounter (Signed)
Thank you.  Okay to give Mitigare sample while application is processing?

## 2016-08-14 NOTE — Telephone Encounter (Signed)
Can you help me with patient assistance for the Colchicine ?

## 2016-08-15 NOTE — Telephone Encounter (Signed)
Received a call from [place].  He reports Mrs. Bendickson received her allopurinol in the mail today and wanted to know if she should start the medication even though she has not received colchicine yet.  I reviewed that she should start taking colchicine before starting allopurinol to prevent gout flare.  Robert voiced understanding.    He reports he has not talked to Mrs. Abraha yet about the copay assistance program for colchicine, but he plans to try to talk to her today.  [name] will update me on her decision.   Elisabeth Most, Pharm.D., BCPS, CPP Clinical Pharmacist Pager: [phone] Phone: [phone] 08/15/2016 1:22 PM

## 2016-08-16 ENCOUNTER — Encounter: Payer: Self-pay | Admitting: Cardiology

## 2016-08-16 NOTE — Progress Notes (Signed)
Remote ICD transmission.   

## 2016-08-17 ENCOUNTER — Other Ambulatory Visit (HOSPITAL_COMMUNITY): Payer: Medicare HMO

## 2016-08-20 NOTE — Telephone Encounter (Signed)
Received a return call from [place].  He reports he spoke to Mrs. Wieand and her husband about applying for medication assistance for colchicine.  They do want to apply.  I reviewed with them with information is needed for the application (income information for both Mr. And Mrs. Sizemore) and he voiced understanding.  The earliest they can come to the office to complete application and pick up samples is Friday, 08/24/16.  I will plan to meet with the patient on Friday to complete application.    Elisabeth Most, Pharm.D., BCPS, CPP Clinical Pharmacist Pager: [phone] Phone: [phone] 08/20/2016 9:36 AM

## 2016-08-20 NOTE — Telephone Encounter (Signed)
Thank you :)

## 2016-08-21 LAB — CUP PACEART REMOTE DEVICE CHECK
Battery Remaining Longevity: 64 mo
Brady Statistic AS VS Percent: 1.67 %
Brady Statistic RA Percent Paced: 10.05 %
Brady Statistic RV Percent Paced: 0.9 %
HighPow Impedance: 83 Ohm
Implantable Lead Implant Date: 20140407
Implantable Lead Implant Date: 20140407
Implantable Lead Location: 753859
Implantable Lead Location: 753860
Implantable Lead Model: 5076
Implantable Pulse Generator Implant Date: 20140407
Lead Channel Impedance Value: 380 Ohm
Lead Channel Impedance Value: 399 Ohm
Lead Channel Impedance Value: 513 Ohm
Lead Channel Pacing Threshold Pulse Width: 0.4 ms
Lead Channel Pacing Threshold Pulse Width: 0.4 ms
Lead Channel Sensing Intrinsic Amplitude: 3 mV
Lead Channel Sensing Intrinsic Amplitude: 3 mV
Lead Channel Setting Pacing Amplitude: 2 V
Lead Channel Setting Pacing Amplitude: 2.5 V
Lead Channel Setting Pacing Pulse Width: 0.4 ms
MDC IDC LEAD IMPLANT DT: 20140407
MDC IDC LEAD LOCATION: 753858
MDC IDC MSMT BATTERY VOLTAGE: 2.94 V
MDC IDC MSMT LEADCHNL LV IMPEDANCE VALUE: 380 Ohm
MDC IDC MSMT LEADCHNL LV IMPEDANCE VALUE: 646 Ohm
MDC IDC MSMT LEADCHNL LV IMPEDANCE VALUE: 912 Ohm
MDC IDC MSMT LEADCHNL LV PACING THRESHOLD AMPLITUDE: 0.625 V
MDC IDC MSMT LEADCHNL LV PACING THRESHOLD PULSEWIDTH: 0.5 ms
MDC IDC MSMT LEADCHNL RA PACING THRESHOLD AMPLITUDE: 0.625 V
MDC IDC MSMT LEADCHNL RV PACING THRESHOLD AMPLITUDE: 1.25 V
MDC IDC MSMT LEADCHNL RV SENSING INTR AMPL: 27.375 mV
MDC IDC MSMT LEADCHNL RV SENSING INTR AMPL: 27.375 mV
MDC IDC SESS DTM: 20180418011810
MDC IDC SET LEADCHNL LV PACING AMPLITUDE: 1.75 V
MDC IDC SET LEADCHNL LV PACING PULSEWIDTH: 0.5 ms
MDC IDC SET LEADCHNL RV SENSING SENSITIVITY: 0.3 mV
MDC IDC STAT BRADY AP VP PERCENT: 9.48 %
MDC IDC STAT BRADY AP VS PERCENT: 0.71 %
MDC IDC STAT BRADY AS VP PERCENT: 88.15 %

## 2016-08-21 NOTE — Progress Notes (Deleted)
[name], [place] [id number]   CLINIC:  Medical Oncology/Hematology  PCP:  [name], MD [place] [address] [phone]   REASON FOR VISIT:  Follow-up for iron-deficiency anemia AND remote h/o left breast cancer (1992)  CURRENT THERAPY: IV iron prn AND observation   BRIEF ONCOLOGIC HISTORY:    Breast cancer ([place])    Initial Diagnosis    Breast cancer        HISTORY OF PRESENT ILLNESS:  (Per Kirby Crigler, PA-C's note on 12/20/15)    INTERVAL HISTORY:  [name] presents for follow-up of her iron-deficiency anemia and h/o left breast cancer s/p mastectomy and 5 years of Tamoxifen therapy.   She is here today unaccompanied and is very hard of hearing, making it difficult to get a reliable history from the patient. She reports feeling more tired recently; she has some dyspnea on exertion where she has to stop and rest.  Denies any blood in her stools or dark/tarry stools.  Endorses occasional "little headaches"; she takes Tylenol very sparingly which is helpful and alleviates her headache.    Her appetite is good. Overall, she feels "pretty good", except for fatigue.    REVIEW OF SYSTEMS:  Review of Systems  Constitutional: Positive for fatigue. Negative for appetite change.  HENT:         Hard of hearing  Eyes: Negative.   Respiratory: Positive for shortness of breath (dyspnea on exertion ). Negative for cough.   Cardiovascular: Negative.  Negative for chest pain and palpitations.  Gastrointestinal: Negative.  Negative for abdominal pain, blood in stool, constipation, diarrhea, nausea and vomiting.  Endocrine: Negative.   Genitourinary: Negative.  Negative for dysuria, hematuria and vaginal bleeding.   Musculoskeletal: Negative.   Skin: Negative.  Negative for rash.  Neurological: Positive for headaches.  Hematological: Negative.   Psychiatric/Behavioral: Negative.      PAST MEDICAL/SURGICAL  HISTORY:  Past Medical History:  Diagnosis Date  . Anemia    Hgb of 9-10  . Anemia of chronic disease    Hgb of 9-10 chronically; 06/2010: H&H-10.7/33.5, MCV-81, normal iron studies in 2010   . Arteriosclerotic cardiovascular disease (ASCVD)    Remote PTCA by patient report; LBBB; associated cardiomyopathy, presumed ischemic with EF 40-45% previously, 20% in 06/2009; h/o clinical congestive heart failure; negative stress nuclear in 2009 with inferoseptal and apical scar  . Automatic implantable cardioverter-defibrillator in situ   . Breast cancer ([place])    breast  . Chronic bronchitis ([place])   . Chronic renal disease, stage 3, moderately decreased glomerular filtration rate (GFR) between 30-59 mL/min/1.73 square meter 02/01/2016  . Congestive heart failure (CHF) ([place])   . Elevated cholesterol   . Elevated sed rate   . GERD (gastroesophageal reflux disease)   . Gout   . HOH (hard of hearing)   . Hyperlipidemia   . Hypertension   . Rheumatoid arthritis [facility])    Past Surgical History:  Procedure Laterality Date  . BI-VENTRICULAR IMPLANTABLE CARDIOVERTER DEFIBRILLATOR N/A 07/28/2012   Procedure: BI-VENTRICULAR IMPLANTABLE CARDIOVERTER DEFIBRILLATOR  (CRT-D);  Surgeon: Evans Lance, MD;  Location[facility] CATH LAB;  Service: Cardiovascular;  Laterality: N/A;  . BI-VENTRICULAR IMPLANTABLE CARDIOVERTER DEFIBRILLATOR  (CRT-D)  07/28/2012  . BREAST BIOPSY Bilateral   . CATARACT EXTRACTION W/ INTRAOCULAR LENS IMPLANT Left   . CATARACT EXTRACTION W/PHACO Right 09/15/2013   Procedure: CATARACT EXTRACTION PHACO AND INTRAOCULAR LENS PLACEMENT (IOC);  Surgeon: Elta Guadeloupe T. Gershon Crane, MD;  Location: [facility] ORS;  Service: Ophthalmology;  Laterality: Right;  CDE:  10.74  . COLONOSCOPY N/A 03/04/2015   Procedure: COLONOSCOPY;  Surgeon: Rogene Houston, MD;  Location: [facility] ENDO SUITE;  Service: Endoscopy;  Laterality: N/A;  10:50   . ESOPHAGOGASTRODUODENOSCOPY N/A 03/04/2015   Procedure: ESOPHAGOGASTRODUODENOSCOPY (EGD);   Surgeon: Rogene Houston, MD;  Location: [facility] ENDO SUITE;  Service: Endoscopy;  Laterality: N/A;  . MASTECTOMY Left 1998  . TOTAL KNEE ARTHROPLASTY Right    Dr.Harrison  . TUBAL LIGATION       SOCIAL HISTORY:  Social History   Social History  . Marital status: Married    Spouse name: N/A  . Number of children: N/A  . Years of education: N/A   Occupational History  . Retired    Social History Main Topics  . Smoking status: Former Smoker    Packs/day: 0.25    Years: 30.00    Types: Cigarettes    Start date: 09/17/1956    Quit date: 04/24/2007  . Smokeless tobacco: Current User    Types: Chew     Comment: 07/03/2013 "smoked some; don't know how much or how long or when I quit"  . Alcohol use No  . Drug use: No  . Sexual activity: Not Currently    Birth control/ protection: Post-menopausal   Other Topics Concern  . Not on file   Social History Narrative   Married, lives with spouse   1 daughter, died in 21 in a car accident   Retired - worked in [employer] fund[employer]   No recent travel    FAMILY HISTORY:  Family History  Problem Relation Age of Onset  . Diabetes Father   . Pancreatic cancer Father   . Hypertension Brother     CURRENT MEDICATIONS:  Outpatient Encounter Prescriptions as of 08/22/2016  Medication Sig  . acetaminophen (TYLENOL) 500 MG tablet Take 1,000 mg by mouth every 6 (six) hours as needed.   [name] allopurinol (ZYLOPRIM) 100 MG tablet Take 1 tablet (100 mg total) by mouth daily.  [name] aspirin EC 81 MG tablet Take 81 mg by mouth daily.  . carvedilol (COREG) 6.25 MG tablet TAKE 1 TABLET BY MOUTH TWICE A DAY WITH MEALS  . colchicine 0.6 MG tablet Take 1 tablet (0.6 mg total) by mouth daily.  . folic acid (FOLVITE) 1 MG tablet Take 1 mg by mouth daily.  . furosemide (LASIX) 40 MG tablet Take 1 tablet by mouth twice a day.  May take extra 40 mg for weight gain of 3 lbs in 24 hrs  . hydroxychloroquine (PLAQUENIL) 200 MG tablet Take 1 tablet (200 mg total) by mouth daily.  Monday-Friday  . KLOR-CON M10 10 MEQ tablet TAKE 1 TABLET BY MOUTH TWICE A DAY  . leflunomide (ARAVA) 10 MG tablet TAKE 1 TABLET (10 MG TOTAL) BY MOUTH DAILY. (Patient taking differently: Take 10 mg by mouth every other day. )  . lisinopril (PRINIVIL,ZESTRIL) 20 MG tablet Take 10 mg by mouth daily.   [name] lovastatin (MEVACOR) 40 MG tablet TAKE 1 TABLET (40 MG TOTAL) BY MOUTH AT BEDTIME.  [name] omeprazole (PRILOSEC) 20 MG capsule Take 20 mg by mouth daily. For gas/heartburn  . PROAIR HFA 108 (90 BASE) MCG/ACT inhaler Inhale 2 puffs into the lungs every 4 (four) hours as needed for wheezing or shortness of breath. Reported on 08/26/2015   No facility-administered encounter medications on file as of 08/22/2016.     ALLERGIES:  No Known Allergies   PHYSICAL  EXAM:  ECOG Performance status: 2 - Symptomatic; requires assistance.   There were no vitals filed for this visit. There were no vitals filed for this visit.  Physical Exam  Constitutional: She is oriented to person, place, and time and well-developed, well-nourished, and in no distress.  HENT:  Head: Normocephalic.  Mouth/Throat: Oropharynx is clear and moist. Mucous membranes are pale. No oropharyngeal exudate.  Very hard of hearing   Eyes: Conjunctivae are normal. Pupils are equal, round, and reactive to light. No scleral icterus.  Neck: Normal range of motion. Neck supple.  Cardiovascular: Normal rate, regular rhythm and normal heart sounds.   Pulmonary/Chest: Effort normal and breath sounds normal. No respiratory distress.    Abdominal: Soft. Bowel sounds are normal. She exhibits no distension. There is no tenderness.  Musculoskeletal: Normal range of motion. She exhibits no edema.  Required assistance to get onto exam table.  (R) wrist brace in place.   Lymphadenopathy:    She has no cervical adenopathy.  Neurological: She is alert and oriented to person, place, and time.  Skin: Skin is warm and dry. No rash noted.  Psychiatric:  Mood, memory, affect and judgment normal.     LABORATORY DATA:  I have reviewed the labs as listed.  CBC    Component Value Date/Time   WBC 5.5 08/08/2016 0848   RBC 3.68 (L) 08/08/2016 0848   HGB 8.7 (L) 08/08/2016 0848   HCT 28.0 (L) 08/08/2016 0848   PLT 260 08/08/2016 0848   MCV 76.1 (L) 08/08/2016 0848   MCH 23.6 (L) 08/08/2016 0848   MCHC 31.1 08/08/2016 0848   RDW 21.5 (H) 08/08/2016 0848   LYMPHSABS 1.5 08/08/2016 0848   MONOABS 0.8 08/08/2016 0848   EOSABS 0.2 08/08/2016 0848   BASOSABS 0.1 08/08/2016 0848   CMP Latest Ref Rng & Units 08/08/2016 07/19/2016 07/19/2016  Glucose 65 - 99 mg/dL 101(H) 103(H) 105(H)  BUN 6 - 20 mg/dL 24(H) 29(H) 28(H)  Creatinine 0.44 - 1.00 mg/dL 1.10(H) 1.26(H) 1.15(H)  Sodium 135 - 145 mmol/L 136 139 140  Potassium 3.5 - 5.1 mmol/L 3.6 3.6 3.6  Chloride 101 - 111 mmol/L 102 106 107  CO2 22 - 32 mmol/L 24 24 25   Calcium 8.9 - 10.3 mg/dL 8.8(L) 8.7(L) 8.8(L)  Total Protein 6.5 - 8.1 g/dL 7.7 7.8 7.7  Total Bilirubin 0.3 - 1.2 mg/dL 0.1(L) 0.4 0.6  Alkaline Phos 38 - 126 U/L 110 110 110  AST 15 - 41 U/L 18 18 19   ALT 14 - 54 U/L 12(L) 11(L) 12(L)         PENDING LABS:    DIAGNOSTIC IMAGING:  Last mammogram: 03/05/16   PATHOLOGY:     ASSESSMENT & PLAN:   Iron deficiency anemia:  -Hgb critical alert 6.2 g/dL today; she has some fatigue and dyspnea on exertion.  Denies any blood in her stools/melena.   -Last IV iron infusion (Ferric gluconate 125 mg on 02/09/16).  -Calculated iron deficit based on today's hemoglobin and weight = 1,085.  Serum iron low today at 23 ug/dL; ferritin low at 41 ng/mL. Goal is iron >100 ug/dL.  Serum folate/B12 normal.  -We will make arrangements for her to get 2 units PRBCs this week, along with 1 dose of Feraheme 510 mg (since we are transfusing blood as well).  Supportive therapy plan for Feraheme created today.  -Return to cancer center in 3 months for continued follow-up.   History of left  breast cancer:  -Breast exam  benign today; no evidence of recurrence. She is now 16+ years out from her breast cancer diagnosis.  -Due for unilateral right breast mammogram in 02/2017.      Dispo:  -Return to cancer center on Wednesday, 06/06/16 for PRBC & IV iron infusion -Return to cancer center for follow-up in 3 months.    All questions were answered to patient's stated satisfaction. Encouraged patient to call with any new concerns or questions before her next visit to the cancer center and we can certain see her sooner, if needed.      Mike Craze, NP [place] [phone]

## 2016-08-22 ENCOUNTER — Ambulatory Visit (HOSPITAL_COMMUNITY): Payer: Medicare HMO

## 2016-08-22 ENCOUNTER — Ambulatory Visit (HOSPITAL_COMMUNITY): Payer: Medicare HMO | Admitting: Adult Health

## 2016-08-22 ENCOUNTER — Other Ambulatory Visit (HOSPITAL_COMMUNITY): Payer: Medicare HMO

## 2016-08-23 ENCOUNTER — Ambulatory Visit (INDEPENDENT_AMBULATORY_CARE_PROVIDER_SITE_OTHER): Payer: Medicare HMO

## 2016-08-23 DIAGNOSIS — I5022 Chronic systolic (congestive) heart failure: Secondary | ICD-10-CM | POA: Diagnosis not present

## 2016-08-23 DIAGNOSIS — Z95810 Presence of automatic (implantable) cardiac defibrillator: Secondary | ICD-10-CM | POA: Diagnosis not present

## 2016-08-24 ENCOUNTER — Telehealth: Payer: Self-pay | Admitting: Pharmacist

## 2016-08-24 NOTE — Telephone Encounter (Signed)
I received a return call from [place].  I explained what income information is needed.  [name] reports he will talk to [facility] and her husband and try to get this information.  He will try to bring the information to clinic next Wednesday.    Elisabeth Most, Pharm.D., BCPS, CPP Clinical Pharmacist Pager: [phone] Phone: [phone] 08/24/2016 2:20 PM

## 2016-08-24 NOTE — Telephone Encounter (Signed)
Patient came to the office today with her husband, [name].  She completed [employer] patient assistance application for Colcrys and brought their income information.  Provider portion was completed by Eliezer Lofts, PA-C on 08/14/16.  I submitted application to [place].    Medication Samples have been provided to the patient.  Drug name: Mitigare       Strength: 0.6 mg        Qty: 30 tablets LOT: [id number], [id number]  Exp.Date: 7/18, 2/19  Dosing instructions: Take 1 tablet by mouth daily.    The patient has been instructed regarding the correct time, dose, and frequency of taking this medication, including desired effects and most common side effects. Counseled patient on instructions from 08/10/16 visit to start colchicine then restart allopurinol 100 mg daily in 2 weeks.  Patient voiced understanding.   Will update patient once we know status of [employer] application.    Cyndia Skeeters 9:41 AM 08/24/2016

## 2016-08-24 NOTE — Progress Notes (Signed)
EPIC Encounter for ICM Monitoring  Patient Name: [name] is a 77 y.o. female Date: 08/24/2016 Primary Care Physican: [name], MD [facility] [place] Electrophysiologist: Lovena Le Dry Weight:unknown Bi-V Pacing: 92.5%       Attempted call to nephew [name].  Heart Failure questions reviewed, pt asymptomatic.   Thoracic impedance normal.  Prescribed dosage: Furosemide 40 mg 1 tablet by mouth twice a day. May take extra 40 mg for weight gain of 3 lbs in 24 hrs.  Klor Con 10 mEq 1 tablet twice a day.    LABS:  08/08/2016 Creatinine 1.10, BUN 24, Potassium 3.6, Sodium 136, EGFR 47-55 07/19/2016 Creatinine 1.26, BUN 29, Potassium 3.6, Sodium 139, EGFR 40-46 06/22/2016 Creatinine 1.44, BUN 37, Potassium 3.7, Sodium 140, EGFR 34-39 06/01/2016 Creatinine 1.68, BUN 43, Potassium 4.7, Sodium 132, EGFR 28-33 01/31/2016 Creatinine 1.31, BUN 34, Potassium 4.5, Sodium 137 GFR 38-44 01/11/2016 Creatinine 1.48, BUN 38, Potassium 4.1, Sodium 135 GFR 33-38 12/20/2015 Creatinine 1.77, BUN 40, Potassium 4.0, Sodium 140, GFR 27-31 12/02/2015 Creatinine 1.38, BUN 28, Potassium 4.0, Sodium 136, GFR 36-42 11/29/2015 Creatinine 1.68, BUN 31, Potassium 4.1, Sodium 137, GFR 28-33 11/02/2015 Creatinine 1.73, BUN 41, Potassium 4.5, Sodium 137, GFR 27-32 10/21/2015 Creatinine 2.01, BUN 35, Potassium 5.0, Sodium 136, GFR 23-26 10/12/2015 Creatinine 1.61, BUN 30, Potassium 4.0, Sodium 138, GFR 30-34 10/06/2015 Creatinine 1.45, BUN 33, Potassium 3.7, Sodium 138, GFR 34-39 09/30/2015 Creatinine 1.55, BUN 37, Potassium 4.2, Sodium 139, GFR 31-36 09/21/2015 Creatinine 1.72, BUN 49, Potassium 3.7, Sodium 138, GFR 27-32  Recommendations: No changes.  Encouraged to call for fluid symptoms.  Follow-up plan: ICM clinic phone appointment on 09/24/2016.    Copy of ICM check sent to device physician.   3 month ICM trend: 08/23/2016   1 Year ICM trend:      [name],  RN 08/24/2016 9:41 AM

## 2016-08-24 NOTE — Telephone Encounter (Signed)
Received fax from [place] stating the income information provided was not accepted.  Patient must submit last years tax return (1040, 1040A, [id number]), two recent pay stubs, or [place].    I attempted to contact patient/patient's nephew [name] to discuss this.  I left a message asking her to call me back.    Elisabeth Most, Pharm.D., BCPS, CPP Clinical Pharmacist Pager: [phone] Phone: [phone] 08/24/2016 1:58 PM

## 2016-08-27 ENCOUNTER — Telehealth: Payer: Self-pay | Admitting: Pharmacist

## 2016-08-27 NOTE — Telephone Encounter (Signed)
I received a call from [name], patient's nephew, wanting to confirm what income documentation is needed for the patient assistance application.  I reviewed that tax form 1040, 2 pay stubs, or social security [phone] letter is needed.  Patient reports he will discuss this with [name] and they will try to get the required documentation.  He will bring it up as soon as he is able.    Elisabeth Most, Pharm.D., BCPS, CPP Clinical Pharmacist Pager: [phone] Phone: [phone] 08/27/2016 3:39 PM

## 2016-08-30 ENCOUNTER — Ambulatory Visit: Payer: Medicare HMO | Admitting: Rheumatology

## 2016-09-05 ENCOUNTER — Encounter (HOSPITAL_COMMUNITY): Payer: Medicare HMO | Attending: Adult Health

## 2016-09-05 ENCOUNTER — Encounter (HOSPITAL_COMMUNITY): Payer: Self-pay | Admitting: Oncology

## 2016-09-05 ENCOUNTER — Other Ambulatory Visit (HOSPITAL_COMMUNITY): Payer: Medicare HMO

## 2016-09-05 ENCOUNTER — Ambulatory Visit (HOSPITAL_COMMUNITY): Payer: Medicare HMO | Admitting: Oncology

## 2016-09-05 ENCOUNTER — Encounter (HOSPITAL_BASED_OUTPATIENT_CLINIC_OR_DEPARTMENT_OTHER): Payer: Medicare HMO | Admitting: Oncology

## 2016-09-05 ENCOUNTER — Encounter (INDEPENDENT_AMBULATORY_CARE_PROVIDER_SITE_OTHER): Payer: Self-pay | Admitting: *Deleted

## 2016-09-05 VITALS — BP 129/51 | HR 85 | Temp 98.4°F | Resp 20 | Wt 144.5 lb

## 2016-09-05 DIAGNOSIS — D638 Anemia in other chronic diseases classified elsewhere: Secondary | ICD-10-CM

## 2016-09-05 DIAGNOSIS — D509 Iron deficiency anemia, unspecified: Secondary | ICD-10-CM

## 2016-09-05 DIAGNOSIS — N189 Chronic kidney disease, unspecified: Secondary | ICD-10-CM | POA: Diagnosis not present

## 2016-09-05 DIAGNOSIS — C50412 Malignant neoplasm of upper-outer quadrant of left female breast: Secondary | ICD-10-CM | POA: Insufficient documentation

## 2016-09-05 DIAGNOSIS — Z170 Estrogen receptor positive status [ER+]: Secondary | ICD-10-CM | POA: Diagnosis not present

## 2016-09-05 LAB — COMPREHENSIVE METABOLIC PANEL
ALT: 13 U/L — ABNORMAL LOW (ref 14–54)
AST: 19 U/L (ref 15–41)
Albumin: 2.9 g/dL — ABNORMAL LOW (ref 3.5–5.0)
Alkaline Phosphatase: 128 U/L — ABNORMAL HIGH (ref 38–126)
Anion gap: 10 (ref 5–15)
BUN: 29 mg/dL — ABNORMAL HIGH (ref 6–20)
CO2: 24 mmol/L (ref 22–32)
Calcium: 9 mg/dL (ref 8.9–10.3)
Chloride: 103 mmol/L (ref 101–111)
Creatinine, Ser: 1.25 mg/dL — ABNORMAL HIGH (ref 0.44–1.00)
GFR calc Af Amer: 47 mL/min — ABNORMAL LOW (ref >60)
GFR calc non Af Amer: 40 mL/min — ABNORMAL LOW (ref >60)
Glucose, Bld: 97 mg/dL (ref 65–99)
Potassium: 4 mmol/L (ref 3.5–5.1)
Sodium: 137 mmol/L (ref 135–145)
Total Bilirubin: 0.3 mg/dL (ref 0.3–1.2)
Total Protein: 7.9 g/dL (ref 6.5–8.1)

## 2016-09-05 LAB — CBC WITH DIFFERENTIAL/PLATELET
BASOS ABS: 0.1 10*3/uL (ref 0.0–0.1)
Basophils Relative: 2 %
EOS ABS: 0.2 10*3/uL (ref 0.0–0.7)
EOS PCT: 4 %
HCT: 27.4 % — ABNORMAL LOW (ref 36.0–46.0)
Hemoglobin: 8.5 g/dL — ABNORMAL LOW (ref 12.0–15.0)
Lymphocytes Relative: 25 %
Lymphs Abs: 1.2 10*3/uL (ref 0.7–4.0)
MCH: 24 pg — ABNORMAL LOW (ref 26.0–34.0)
MCHC: 31 g/dL (ref 30.0–36.0)
MCV: 77.4 fL — ABNORMAL LOW (ref 78.0–100.0)
MONO ABS: 0.9 10*3/uL (ref 0.1–1.0)
Monocytes Relative: 20 %
Neutro Abs: 2.3 10*3/uL (ref 1.7–7.7)
Neutrophils Relative %: 49 %
PLATELETS: 226 10*3/uL (ref 150–400)
RBC: 3.54 MIL/uL — AB (ref 3.87–5.11)
RDW: 20.6 % — AB (ref 11.5–15.5)
WBC: 4.6 10*3/uL (ref 4.0–10.5)

## 2016-09-05 NOTE — Progress Notes (Signed)
[place], [place] [address]   CLINIC:  Medical Oncology/Hematology  PCP:  [name], MD [place] [address] [phone]   REASON FOR VISIT:  Follow-up for iron-deficiency anemia AND remote h/o left breast cancer (1992)  CURRENT THERAPY: IV iron prn AND observation   BRIEF ONCOLOGIC HISTORY:    Breast cancer ([facility])    Initial Diagnosis    Breast cancer        HISTORY OF PRESENT ILLNESS:  (Per Kirby Crigler, PA-C's note on 12/20/15)    INTERVAL HISTORY:  [name] presents for follow-up of her iron-deficiency anemia and h/o left breast cancer s/p mastectomy and 5 years of Tamoxifen therapy.   She is here today unaccompanied.She states that she is noted that she had black stools 4 days ago and also this morning. She states she does have a history of gastric ulcers. She continues to have fatigue. She also had some abdominal pain this morning as well near her umbilicus. Otherwise she denies any chest pain or shortness of breath. No focal weakness.  REVIEW OF SYSTEMS:  Review of Systems  Constitutional: Positive for fatigue. Negative for appetite change, chills and fever.  HENT:   Negative for hearing loss, lump/mass, mouth sores, sore throat and tinnitus.        Hard of hearing  Eyes: Negative.  Negative for eye problems and icterus.  Respiratory: Negative for chest tightness, cough, hemoptysis, shortness of breath (dyspnea on exertion ) and wheezing.   Cardiovascular: Negative.  Negative for chest pain, leg swelling and palpitations.  Gastrointestinal: Positive for abdominal pain. Negative for abdominal distention, blood in stool, constipation, diarrhea, nausea and vomiting.       Melena  Endocrine: Negative.  Negative for hot flashes.  Genitourinary: Negative.  Negative for difficulty urinating, dysuria, frequency, hematuria and vaginal bleeding.   Musculoskeletal: Negative.  Negative for arthralgias  and neck pain.  Skin: Negative.  Negative for itching and rash.  Neurological: Negative for dizziness, headaches and speech difficulty.  Hematological: Negative.  Negative for adenopathy. Does not bruise/bleed easily.  Psychiatric/Behavioral: Negative.  Negative for confusion. The patient is not nervous/anxious.      PAST MEDICAL/SURGICAL HISTORY:  Past Medical History:  Diagnosis Date  . Anemia    Hgb of 9-10  . Anemia of chronic disease    Hgb of 9-10 chronically; 06/2010: H&H-10.7/33.5, MCV-81, normal iron studies in 2010   . Arteriosclerotic cardiovascular disease (ASCVD)    Remote PTCA by patient report; LBBB; associated cardiomyopathy, presumed ischemic with EF 40-45% previously, 20% in 06/2009; h/o clinical congestive heart failure; negative stress nuclear in 2009 with inferoseptal and apical scar  . Automatic implantable cardioverter-defibrillator in situ   . Breast cancer ([facility])    breast  . Chronic bronchitis ([place])   . Chronic renal disease, stage 3, moderately decreased glomerular filtration rate (GFR) between 30-59 mL/min/1.73 square meter 02/01/2016  . Congestive heart failure (CHF) ([place])   . Elevated cholesterol   . Elevated sed rate   . GERD (gastroesophageal reflux disease)   . Gout   . HOH (hard of hearing)   . Hyperlipidemia   . Hypertension   . Rheumatoid arthritis [facility])    Past Surgical History:  Procedure Laterality Date  . BI-VENTRICULAR IMPLANTABLE CARDIOVERTER DEFIBRILLATOR N/A 07/28/2012   Procedure: BI-VENTRICULAR IMPLANTABLE CARDIOVERTER DEFIBRILLATOR  (CRT-D);  Surgeon: Evans Lance, MD;  Location: [facility] CATH LAB;  Service: Cardiovascular;  Laterality: N/A;  .  BI-VENTRICULAR IMPLANTABLE CARDIOVERTER DEFIBRILLATOR  (CRT-D)  07/28/2012  . BREAST BIOPSY Bilateral   . CATARACT EXTRACTION W/ INTRAOCULAR LENS IMPLANT Left   . CATARACT EXTRACTION W/PHACO Right 09/15/2013   Procedure: CATARACT EXTRACTION PHACO AND INTRAOCULAR LENS PLACEMENT (IOC);  Surgeon: Elta Guadeloupe T.  Gershon Crane, MD;  Location: [facility] ORS;  Service: Ophthalmology;  Laterality: Right;  CDE:  10.74  . COLONOSCOPY N/A 03/04/2015   Procedure: COLONOSCOPY;  Surgeon: Rogene Houston, MD;  Location: [facility] ENDO SUITE;  Service: Endoscopy;  Laterality: N/A;  10:50   . ESOPHAGOGASTRODUODENOSCOPY N/A 03/04/2015   Procedure: ESOPHAGOGASTRODUODENOSCOPY (EGD);  Surgeon: Rogene Houston, MD;  Location: [facility] ENDO SUITE;  Service: Endoscopy;  Laterality: N/A;  . MASTECTOMY Left 1998  . TOTAL KNEE ARTHROPLASTY Right    Dr.Harrison  . TUBAL LIGATION       SOCIAL HISTORY:  Social History   Social History  . Marital status: Married    Spouse name: N/A  . Number of children: N/A  . Years of education: N/A   Occupational History  . Retired    Social History Main Topics  . Smoking status: Former Smoker    Packs/day: 0.25    Years: 30.00    Types: Cigarettes    Start date: 09/17/1956    Quit date: 04/24/2007  . Smokeless tobacco: Current User    Types: Chew     Comment: 07/03/2013 "smoked some; don't know how much or how long or when I quit"  . Alcohol use No  . Drug use: No  . Sexual activity: Not Currently    Birth control/ protection: Post-menopausal   Other Topics Concern  . Not on file   Social History Narrative   Married, lives with spouse   1 daughter, died in 3 in a car accident   Retired - worked in [employer] fund[employer]   No recent travel    FAMILY HISTORY:  Family History  Problem Relation Age of Onset  . Diabetes Father   . Pancreatic cancer Father   . Hypertension Brother     CURRENT MEDICATIONS:  Outpatient Encounter Prescriptions as of 09/05/2016  Medication Sig  . acetaminophen (TYLENOL) 500 MG tablet Take 1,000 mg by mouth every 6 (six) hours as needed.   [name] allopurinol (ZYLOPRIM) 100 MG tablet Take 1 tablet (100 mg total) by mouth daily.  [name] aspirin EC 81 MG tablet Take 81 mg by mouth daily.  . carvedilol (COREG) 6.25 MG tablet TAKE 1 TABLET BY MOUTH TWICE A DAY WITH MEALS  .  colchicine 0.6 MG tablet Take 1 tablet (0.6 mg total) by mouth daily.  . folic acid (FOLVITE) 1 MG tablet Take 1 mg by mouth daily.  . furosemide (LASIX) 40 MG tablet Take 1 tablet by mouth twice a day.  May take extra 40 mg for weight gain of 3 lbs in 24 hrs  . hydroxychloroquine (PLAQUENIL) 200 MG tablet Take 1 tablet (200 mg total) by mouth daily. Monday-Friday  . KLOR-CON M10 10 MEQ tablet TAKE 1 TABLET BY MOUTH TWICE A DAY  . leflunomide (ARAVA) 10 MG tablet TAKE 1 TABLET (10 MG TOTAL) BY MOUTH DAILY. (Patient taking differently: Take 10 mg by mouth every other day. )  . lisinopril (PRINIVIL,ZESTRIL) 20 MG tablet Take 10 mg by mouth daily.   [name] lovastatin (MEVACOR) 40 MG tablet TAKE 1 TABLET (40 MG TOTAL) BY MOUTH AT BEDTIME.  [name] omeprazole (PRILOSEC) 20 MG capsule Take 20 mg by mouth daily. For gas/heartburn  . PROAIR HFA  108 (90 BASE) MCG/ACT inhaler Inhale 2 puffs into the lungs every 4 (four) hours as needed for wheezing or shortness of breath. Reported on 08/26/2015   No facility-administered encounter medications on file as of 09/05/2016.     ALLERGIES:  No Known Allergies   PHYSICAL EXAM:  ECOG Performance status: 2 - Symptomatic; requires assistance.   Physical Exam  Constitutional: She is oriented to person, place, and time and well-developed, well-nourished, and in no distress. No distress.  HENT:  Head: Normocephalic and atraumatic.  Mouth/Throat: Oropharynx is clear and moist. Mucous membranes are pale. No oropharyngeal exudate.  Very hard of hearing   Eyes: Conjunctivae are normal. Pupils are equal, round, and reactive to light. No scleral icterus.  Neck: Normal range of motion. Neck supple. No JVD present.  Cardiovascular: Normal rate, regular rhythm and normal heart sounds.  Exam reveals no gallop and no friction rub.   No murmur heard. Pulmonary/Chest: Effort normal and breath sounds normal. No respiratory distress. She has no wheezes. She has no rales.  Abdominal:  Soft. Bowel sounds are normal. She exhibits no distension. There is no tenderness. There is no guarding.  Musculoskeletal: Normal range of motion. She exhibits no edema or tenderness.  Required assistance to get onto exam table.  (R) wrist brace in place.   Lymphadenopathy:    She has no cervical adenopathy.  Neurological: She is alert and oriented to person, place, and time. No cranial nerve deficit.  Skin: Skin is warm and dry. No rash noted. No erythema. No pallor.  Psychiatric: Mood, memory, affect and judgment normal.  BREAST: exam deferred today   LABORATORY DATA:  I have reviewed the labs as listed.  CBC    Component Value Date/Time   WBC 4.6 09/05/2016 1351   RBC 3.54 (L) 09/05/2016 1351   HGB 8.5 (L) 09/05/2016 1351   HCT 27.4 (L) 09/05/2016 1351   PLT 226 09/05/2016 1351   MCV 77.4 (L) 09/05/2016 1351   MCH 24.0 (L) 09/05/2016 1351   MCHC 31.0 09/05/2016 1351   RDW 20.6 (H) 09/05/2016 1351   LYMPHSABS 1.2 09/05/2016 1351   MONOABS 0.9 09/05/2016 1351   EOSABS 0.2 09/05/2016 1351   BASOSABS 0.1 09/05/2016 1351   CMP Latest Ref Rng & Units 09/05/2016 08/08/2016 07/19/2016  Glucose 65 - 99 mg/dL 97 101(H) 103(H)  BUN 6 - 20 mg/dL 29(H) 24(H) 29(H)  Creatinine 0.44 - 1.00 mg/dL 1.25(H) 1.10(H) 1.26(H)  Sodium 135 - 145 mmol/L 137 136 139  Potassium 3.5 - 5.1 mmol/L 4.0 3.6 3.6  Chloride 101 - 111 mmol/L 103 102 106  CO2 22 - 32 mmol/L 24 24 24   Calcium 8.9 - 10.3 mg/dL 9.0 8.8(L) 8.7(L)  Total Protein 6.5 - 8.1 g/dL 7.9 7.7 7.8  Total Bilirubin 0.3 - 1.2 mg/dL 0.3 0.1(L) 0.4  Alkaline Phos 38 - 126 U/L 128(H) 110 110  AST 15 - 41 U/L 19 18 18   ALT 14 - 54 U/L 13(L) 12(L) 11(L)         PENDING LABS:    DIAGNOSTIC IMAGING:  Last mammogram: 03/05/16   PATHOLOGY:     ASSESSMENT & PLAN:   Iron deficiency anemia:  -Her hemoglobin has been relatively stable in the 8 g/dL range. I believe it is multifactorial from her chronic kidney disease as well as  underlying iron deficiency from blood loss. -I will make that referral for her to see GI for EGD since she's been having melanotic stools; she does have  a history gastric ulcers. -Last dose of IV iron was with Feraheme in February 2018. I will follow up on her iron studies from today, if her ferritin is below 100, I will plan to give her another IV iron infusion.  History of left breast cancer:  -NED. She is now 16+ years out from her breast cancer diagnosis.  -Due for unilateral right breast mammogram in 02/2017.    Dispo:  -Return to clinic in 3 months for follow-up with labs as stated below.  Orders Placed This Encounter  Procedures  . CBC with Differential    Standing Status:   Future    Standing Expiration Date:   09/05/2017  . Comprehensive metabolic panel    Standing Status:   Future    Standing Expiration Date:   09/05/2017  . Iron and TIBC    Standing Status:   Future    Standing Expiration Date:   09/05/2017  . Ferritin    Standing Status:   Future    Standing Expiration Date:   09/05/2017     All questions were answered to patient's stated satisfaction. Encouraged patient to call with any new concerns or questions before her next visit to the cancer center and we can certain see her sooner, if needed.     Twana First, MD

## 2016-09-05 NOTE — Patient Instructions (Signed)
[name] at [facility] Discharge Instructions  RECOMMENDATIONS MADE BY THE CONSULTANT AND ANY TEST RESULTS WILL BE SENT TO YOUR REFERRING PHYSICIAN.  You were seen today by Dr. Twana First Follow up in 3 months with lab work We will refer you back to Dr. Laural Golden for GI bleed evaluation   Thank you for choosing [place] at [facility] to provide your oncology and hematology care.  To afford each patient quality time with our provider, please arrive at least 15 minutes before your scheduled appointment time.    If you have a lab appointment with the [facility] please come in thru the  Main Entrance and check in at the main information desk  You need to re-schedule your appointment should you arrive 10 or more minutes late.  We strive to give you quality time with our providers, and arriving late affects you and other patients whose appointments are after yours.  Also, if you no show three or more times for appointments you may be dismissed from the clinic at the providers discretion.     Again, thank you for choosing [facility]  Our hope is that these requests will decrease the amount of time that you wait before being seen by our physicians.       _____________________________________________________________  Should you have questions after your visit to [facility], please contact our office at [phone] between the hours of 8:30 a.m. and 4:30 p.m.  Voicemails left after 4:30 p.m. will not be returned until the following business day.  For prescription refill requests, have your pharmacy contact our office.       Resources For Cancer Patients and their Caregivers ? American Cancer Society: Can assist with transportation, wigs, general needs, runs Look Good Feel Better.        (806)005-4652 ? Cancer Care: Provides financial assistance, online support groups, medication/co-pay assistance.   1-800-813-HOPE 773-546-5587) ? [place] Assists [place] cancer patients and their families through emotional , educational and financial support.  585-817-1938 ? [place] DSS Where to apply for food stamps, Medicaid and utility assistance. 605-725-7524 ? [facility]: Transportation to medical appointments. 360-633-5666 ? Social Security Administration: May apply for disability if have a Stage IV cancer. 272-675-7134 626-121-0941 ? [employer], Disability and Transit Services: Assists with nutrition, care and transit needs. [place] Support Programs: @10RELATIVEDAYS @ > Cancer Support Group  2nd Tuesday of the month 1pm-2pm, Journey Room  > Creative Journey  3rd Tuesday of the month 1130am-1pm, Journey Room  > Look Good Feel Better  1st Wednesday of the month 10am-12 noon, Journey Room (Call [place] to register [phone])

## 2016-09-10 DIAGNOSIS — I5022 Chronic systolic (congestive) heart failure: Secondary | ICD-10-CM | POA: Diagnosis not present

## 2016-09-10 DIAGNOSIS — I10 Essential (primary) hypertension: Secondary | ICD-10-CM | POA: Diagnosis not present

## 2016-09-10 DIAGNOSIS — M069 Rheumatoid arthritis, unspecified: Secondary | ICD-10-CM | POA: Diagnosis not present

## 2016-09-10 DIAGNOSIS — M10049 Idiopathic gout, unspecified hand: Secondary | ICD-10-CM | POA: Diagnosis not present

## 2016-09-13 ENCOUNTER — Telehealth: Payer: Self-pay | Admitting: Pharmacist

## 2016-09-13 DIAGNOSIS — M0579 Rheumatoid arthritis with rheumatoid factor of multiple sites without organ or systems involvement: Secondary | ICD-10-CM

## 2016-09-13 NOTE — Telephone Encounter (Signed)
Received confirmation from Bernita Buffy that patient was approved for Colcrys patient assistance program through 04/22/17.  Called patient's nephew [name] to update him on this information.  Also wanted to confirm with [name] that they knew that [place] and Colcrys are the same medications and should not be taken together.  She can continue the [place] samples that were provided then start Colcrys.    I left a message for patient to call me back.   Elisabeth Most, Pharm.D., BCPS, CPP Clinical Pharmacist Pager: [phone] Phone: [phone] 09/13/2016 4:31 PM

## 2016-09-13 NOTE — Telephone Encounter (Signed)
Medication Samples have been provided to the patient.  Drug name: Mitigare       Strength: 0.6 mg        Qty: 2 bottles (30 days total)  LOT: [id number]  Exp.Date: 05/2017  Dosing instructions: Take 1 tablet by mouth daily  The patient has been instructed regarding the correct time, dose, and frequency of taking this medication, including desired effects and most common side effects.   Will update patient once we know status of Colcrys application.   Cyndia Skeeters 11:05 AM 09/13/2016

## 2016-09-13 NOTE — Telephone Encounter (Signed)
OK to give 2 bottles of Mitigare since delay in getting patient assistance

## 2016-09-13 NOTE — Telephone Encounter (Signed)
Patient's nephew returned Rachel's call.

## 2016-09-13 NOTE — Telephone Encounter (Signed)
Patient's nephew [name] brought additional income documents to submit to [place] patient assistance for Colcrys.  [name] was given 30 days of Mitigare samples on 08/24/16.  She has 9 days remaining.  She is scheduled for follow up with you on 10/11/16.  Okay to give her additional 30 days of Mitigate samples to last until her follow up with you while application is processing?    Elisabeth Most, Pharm.D., BCPS, CPP Clinical Pharmacist Pager: [phone] Phone: [phone] 09/13/2016 10:53 AM

## 2016-09-14 MED ORDER — HYDROXYCHLOROQUINE SULFATE 200 MG PO TABS: 200.0000 mg | ORAL_TABLET | Freq: Every day | ORAL | 0 refills | Status: DC

## 2016-09-14 NOTE — Telephone Encounter (Signed)
Left message to advise [name] prescription sent to pharmacy and that [name] will be due for her PLQ eye exam will be due July 2018.

## 2016-09-14 NOTE — Telephone Encounter (Signed)
OK TO REFILL PLQ  LET ROBERT KNOW PLQ EYE EXAM WILL BE DUE July 2018

## 2016-09-14 NOTE — Telephone Encounter (Signed)
Last Visit: 08/10/16 Next Visit: 10/11/16 Labs: 09/05/16 Creat. 1.25 GFR 40 BUN 29 Hgb 8.5 PLQ Eye Exam: 11/10/15 WNL  Okay to refill PLQ?

## 2016-09-14 NOTE — Telephone Encounter (Signed)
For you -

## 2016-09-14 NOTE — Telephone Encounter (Signed)
I spoke to [name] and informed him that Colcrys patient assistance was approved.  I counseled him that [place] are the same medication.  [name] should finish the Mitigare samples then start taking the Colcrys.  He voiced understanding.    He reports [name] is needed a refill of hydroxychloroquine.

## 2016-09-20 ENCOUNTER — Encounter (HOSPITAL_COMMUNITY): Payer: Self-pay | Admitting: Emergency Medicine

## 2016-09-20 ENCOUNTER — Inpatient Hospital Stay (HOSPITAL_COMMUNITY)
Admission: EM | Admit: 2016-09-20 | Discharge: 2016-09-25 | DRG: 378 | Disposition: A | Discharge status: Home / Self Care | Payer: Medicare HMO | Attending: Internal Medicine | Admitting: Internal Medicine

## 2016-09-20 DIAGNOSIS — H9190 Unspecified hearing loss, unspecified ear: Secondary | ICD-10-CM | POA: Diagnosis present

## 2016-09-20 DIAGNOSIS — K921 Melena: Secondary | ICD-10-CM | POA: Diagnosis not present

## 2016-09-20 DIAGNOSIS — Z87891 Personal history of nicotine dependence: Secondary | ICD-10-CM

## 2016-09-20 DIAGNOSIS — K31819 Angiodysplasia of stomach and duodenum without bleeding: Secondary | ICD-10-CM | POA: Diagnosis present

## 2016-09-20 DIAGNOSIS — D62 Acute posthemorrhagic anemia: Secondary | ICD-10-CM | POA: Diagnosis present

## 2016-09-20 DIAGNOSIS — Z8249 Family history of ischemic heart disease and other diseases of the circulatory system: Secondary | ICD-10-CM

## 2016-09-20 DIAGNOSIS — I2510 Atherosclerotic heart disease of native coronary artery without angina pectoris: Secondary | ICD-10-CM | POA: Diagnosis present

## 2016-09-20 DIAGNOSIS — M109 Gout, unspecified: Secondary | ICD-10-CM | POA: Diagnosis present

## 2016-09-20 DIAGNOSIS — Z800 Family history of malignant neoplasm of digestive organs: Secondary | ICD-10-CM

## 2016-09-20 DIAGNOSIS — I130 Hypertensive heart and chronic kidney disease with heart failure and stage 1 through stage 4 chronic kidney disease, or unspecified chronic kidney disease: Secondary | ICD-10-CM | POA: Diagnosis present

## 2016-09-20 DIAGNOSIS — K5521 Angiodysplasia of colon with hemorrhage: Secondary | ICD-10-CM | POA: Diagnosis present

## 2016-09-20 DIAGNOSIS — N183 Chronic kidney disease, stage 3 (moderate): Secondary | ICD-10-CM | POA: Diagnosis present

## 2016-09-20 DIAGNOSIS — I255 Ischemic cardiomyopathy: Secondary | ICD-10-CM | POA: Diagnosis present

## 2016-09-20 DIAGNOSIS — Z7982 Long term (current) use of aspirin: Secondary | ICD-10-CM

## 2016-09-20 DIAGNOSIS — I5042 Chronic combined systolic (congestive) and diastolic (congestive) heart failure: Secondary | ICD-10-CM | POA: Diagnosis present

## 2016-09-20 DIAGNOSIS — K219 Gastro-esophageal reflux disease without esophagitis: Secondary | ICD-10-CM | POA: Diagnosis present

## 2016-09-20 DIAGNOSIS — M069 Rheumatoid arthritis, unspecified: Secondary | ICD-10-CM | POA: Diagnosis present

## 2016-09-20 DIAGNOSIS — Z853 Personal history of malignant neoplasm of breast: Secondary | ICD-10-CM

## 2016-09-20 DIAGNOSIS — Z95810 Presence of automatic (implantable) cardiac defibrillator: Secondary | ICD-10-CM | POA: Diagnosis not present

## 2016-09-20 DIAGNOSIS — N184 Chronic kidney disease, stage 4 (severe): Secondary | ICD-10-CM

## 2016-09-20 DIAGNOSIS — K922 Gastrointestinal hemorrhage, unspecified: Secondary | ICD-10-CM | POA: Diagnosis present

## 2016-09-20 DIAGNOSIS — E785 Hyperlipidemia, unspecified: Secondary | ICD-10-CM

## 2016-09-20 DIAGNOSIS — Z96651 Presence of right artificial knee joint: Secondary | ICD-10-CM | POA: Diagnosis present

## 2016-09-20 DIAGNOSIS — Z9012 Acquired absence of left breast and nipple: Secondary | ICD-10-CM

## 2016-09-20 LAB — COMPREHENSIVE METABOLIC PANEL
ALBUMIN: 2.9 g/dL — AB (ref 3.5–5.0)
ALT: 22 U/L (ref 14–54)
ANION GAP: 10 (ref 5–15)
AST: 37 U/L (ref 15–41)
Alkaline Phosphatase: 117 U/L (ref 38–126)
BUN: 37 mg/dL — ABNORMAL HIGH (ref 6–20)
CHLORIDE: 106 mmol/L (ref 101–111)
CO2: 24 mmol/L (ref 22–32)
CREATININE: 1.53 mg/dL — AB (ref 0.44–1.00)
Calcium: 8.9 mg/dL (ref 8.9–10.3)
GFR calc non Af Amer: 31 mL/min — ABNORMAL LOW (ref >60)
GFR, EST AFRICAN AMERICAN: 36 mL/min — AB (ref >60)
GLUCOSE: 112 mg/dL — AB (ref 65–99)
Potassium: 3.9 mmol/L (ref 3.5–5.1)
SODIUM: 140 mmol/L (ref 135–145)
Total Bilirubin: 0.1 mg/dL — ABNORMAL LOW (ref 0.3–1.2)
Total Protein: 7.7 g/dL (ref 6.5–8.1)

## 2016-09-20 LAB — CBC
HCT: 25.2 % — ABNORMAL LOW (ref 36.0–46.0)
HEMOGLOBIN: 7.5 g/dL — AB (ref 12.0–15.0)
MCH: 23.7 pg — AB (ref 26.0–34.0)
MCHC: 29.8 g/dL — AB (ref 30.0–36.0)
MCV: 79.7 fL (ref 78.0–100.0)
Platelets: 288 10*3/uL (ref 150–400)
RBC: 3.16 MIL/uL — AB (ref 3.87–5.11)
RDW: 19.8 % — ABNORMAL HIGH (ref 11.5–15.5)
WBC: 5.1 10*3/uL (ref 4.0–10.5)

## 2016-09-20 LAB — PROTIME-INR
INR: 1.06
Prothrombin Time: 13.9 seconds (ref 11.4–15.2)

## 2016-09-20 LAB — POC OCCULT BLOOD, ED: FECAL OCCULT BLD: POSITIVE — AB

## 2016-09-20 LAB — MRSA PCR SCREENING: MRSA by PCR: POSITIVE — AB

## 2016-09-20 LAB — APTT: aPTT: 24 seconds (ref 24–36)

## 2016-09-20 MED ORDER — ALLOPURINOL 100 MG PO TABS
100.0000 mg | ORAL_TABLET | Freq: Every day | ORAL | Status: DC
  Administered 2016-09-21 – 2016-09-25 (×5): 100 mg via ORAL
  Filled 2016-09-20 (×5): qty 1

## 2016-09-20 MED ORDER — HYDROXYCHLOROQUINE SULFATE 200 MG PO TABS
200.0000 mg | ORAL_TABLET | ORAL | Status: DC
  Administered 2016-09-21 – 2016-09-25 (×3): 200 mg via ORAL
  Filled 2016-09-20 (×5): qty 1

## 2016-09-20 MED ORDER — ONDANSETRON HCL 4 MG/2ML IJ SOLN: 4.0000 mg | Freq: Four times a day (QID) | INTRAMUSCULAR | Status: DC | PRN

## 2016-09-20 MED ORDER — POTASSIUM CHLORIDE IN NACL 20-0.9 MEQ/L-% IV SOLN
INTRAVENOUS | Status: DC
  Administered 2016-09-20 – 2016-09-23 (×5): via INTRAVENOUS

## 2016-09-20 MED ORDER — PANTOPRAZOLE SODIUM 40 MG IV SOLR
40.0000 mg | Freq: Two times a day (BID) | INTRAVENOUS | Status: DC
  Administered 2016-09-20 – 2016-09-22 (×4): 40 mg via INTRAVENOUS
  Filled 2016-09-20 (×4): qty 40

## 2016-09-20 MED ORDER — ONDANSETRON HCL 4 MG PO TABS: 4.0000 mg | ORAL_TABLET | Freq: Four times a day (QID) | ORAL | Status: DC | PRN

## 2016-09-20 MED ORDER — SODIUM CHLORIDE 0.9 % IV SOLN: INTRAVENOUS | Status: DC

## 2016-09-20 MED ORDER — ACETAMINOPHEN 325 MG PO TABS
650.0000 mg | ORAL_TABLET | Freq: Four times a day (QID) | ORAL | Status: DC | PRN
  Administered 2016-09-20 – 2016-09-24 (×3): 650 mg via ORAL
  Filled 2016-09-20 (×3): qty 2

## 2016-09-20 MED ORDER — PANTOPRAZOLE SODIUM 40 MG IV SOLR
40.0000 mg | Freq: Once | INTRAVENOUS | Status: AC
  Administered 2016-09-20: 40 mg via INTRAVENOUS
  Filled 2016-09-20: qty 40

## 2016-09-20 MED ORDER — CARVEDILOL 3.125 MG PO TABS
3.1250 mg | ORAL_TABLET | Freq: Two times a day (BID) | ORAL | Status: DC
  Administered 2016-09-20 – 2016-09-25 (×11): 3.125 mg via ORAL
  Filled 2016-09-20 (×11): qty 1

## 2016-09-20 MED ORDER — PRAVASTATIN SODIUM 40 MG PO TABS
40.0000 mg | ORAL_TABLET | Freq: Every day | ORAL | Status: DC
  Administered 2016-09-21 – 2016-09-25 (×5): 40 mg via ORAL
  Filled 2016-09-20 (×5): qty 1

## 2016-09-20 MED ORDER — ACETAMINOPHEN 650 MG RE SUPP: 650.0000 mg | Freq: Four times a day (QID) | RECTAL | Status: DC | PRN

## 2016-09-20 MED ORDER — FOLIC ACID 1 MG PO TABS
1.0000 mg | ORAL_TABLET | Freq: Every day | ORAL | Status: DC
  Administered 2016-09-21 – 2016-09-25 (×5): 1 mg via ORAL
  Filled 2016-09-20 (×5): qty 1

## 2016-09-20 NOTE — ED Triage Notes (Signed)
Pt c/o intermittent rectal bleeding x 2 weeks. 4 times in all. States is dark blood. Pt states had some abd pain earlier but none now.

## 2016-09-20 NOTE — H&P (Signed)
History and Physical  [name] WJX:[id number] DOB: 28-Nov-1940 DOA: 09/20/2016   PCP: [name], MD   Patient coming from: Home  Chief Complaint: Dark stools  HPI:  [name] is a 76 y.o. female with medical history of coronary artery disease, anemia chronic disease, hypertension, hyperlipidemia, gouty arthritis presented with 1 and 1/2 week history of dark stools.  The patient states that she has had some intermittent epigastric discomfort brought on by eating. Unfortunately, she is a poor historian and unable to provide any other significant history. Remainder of the history is obtained from speaking with EDP and review of the medical record. The patient denies any nausea, vomiting, diarrhea, hematemesis. She denies any chest pain, shortness breath, dizziness, headache, hematochezia, dysuria, hematuria. There is no history of NSAID use except for aspirin.  In the emergency department, the patient was afebrile and hemodynamically stable saturating 100% on room air. BMP and LFTs were unremarkable. Hemoglobin was 7.5 with baseline hemoglobin approximately 8. Serum creatinine was 1.53. Fecal occult blood test was positive with dark stool without bright red blood.  Assessment/Plan: Acute blood loss anemia with positive Hemoccult -03/04/2015-EGD revealed a single duodenal AVM; colonoscopy showed small external hemorrhoids -Check INR and PTT -Type and screen -Check serial hemoglobins -Clear liquid diet -GI consult has been placed-will see am 09/21/16 -Start IV PPI -Hold aspirin -IV fluids 12 hours  Ischemic cardiomyopathy/chronic systolic and diastolic CHF -Appears euvolemic clinically -07/01/2015 echo EF 30-35%, grade 1 DDD, diffuse HK -Daily weights -Holding furosemide -Revaluate 09/21/2016 for restarting -Reduced dose carvedilol secondary to soft blood pressure -s/p AICD  CKD stage III -Baseline creatinine 1.1-1.3 -Presenting creatinine 1.53 -Holding  lisinopril--revaluate 09/21/2016 for restarting -Holding colchicine secondary to CKD  Hyperlipidemia -Continue statin  Gouty arthritis -Holding colchicine due to slight worsening of CKD -Continue allopurinol  Rheumatoid arthritis -Continue Plaquenil           Past Medical History:  Diagnosis Date  . Anemia    Hgb of 9-10  . Anemia of chronic disease    Hgb of 9-10 chronically; 06/2010: H&H-10.7/33.5, MCV-81, normal iron studies in 2010   . Arteriosclerotic cardiovascular disease (ASCVD)    Remote PTCA by patient report; LBBB; associated cardiomyopathy, presumed ischemic with EF 40-45% previously, 20% in 06/2009; h/o clinical congestive heart failure; negative stress nuclear in 2009 with inferoseptal and apical scar  . Automatic implantable cardioverter-defibrillator in situ   . Breast cancer ([place])    breast  . Chronic bronchitis ([place])   . Chronic renal disease, stage 3, moderately decreased glomerular filtration rate (GFR) between 30-59 mL/min/1.73 square meter 02/01/2016  . Congestive heart failure (CHF) ([place])   . Elevated cholesterol   . Elevated sed rate   . GERD (gastroesophageal reflux disease)   . Gout   . HOH (hard of hearing)   . Hyperlipidemia   . Hypertension   . Rheumatoid arthritis [facility])    Past Surgical History:  Procedure Laterality Date  . BI-VENTRICULAR IMPLANTABLE CARDIOVERTER DEFIBRILLATOR N/A 07/28/2012   Procedure: BI-VENTRICULAR IMPLANTABLE CARDIOVERTER DEFIBRILLATOR  (CRT-D);  Surgeon: Evans Lance, MD;  Location[facility] CATH LAB;  Service: Cardiovascular;  Laterality: N/A;  . BI-VENTRICULAR IMPLANTABLE CARDIOVERTER DEFIBRILLATOR  (CRT-D)  07/28/2012  . BREAST BIOPSY Bilateral   . CATARACT EXTRACTION W/ INTRAOCULAR LENS IMPLANT Left   . CATARACT EXTRACTION W/PHACO Right 09/15/2013   Procedure: CATARACT EXTRACTION PHACO AND INTRAOCULAR LENS PLACEMENT (IOC);  Surgeon: Elta Guadeloupe T. Gershon Crane, MD;  Location: [facility] ORS;  Service: Ophthalmology;  Laterality: Right;   CDE:  10.74  . COLONOSCOPY N/A 03/04/2015   Procedure: COLONOSCOPY;  Surgeon: Rogene Houston, MD;  Location: [facility] ENDO SUITE;  Service: Endoscopy;  Laterality: N/A;  10:50   . ESOPHAGOGASTRODUODENOSCOPY N/A 03/04/2015   Procedure: ESOPHAGOGASTRODUODENOSCOPY (EGD);  Surgeon: Rogene Houston, MD;  Location: [facility] ENDO SUITE;  Service: Endoscopy;  Laterality: N/A;  . MASTECTOMY Left 1998  . TOTAL KNEE ARTHROPLASTY Right    Dr.Harrison  . TUBAL LIGATION     Social History:  reports that she quit smoking about 9 years ago. Her smoking use included Cigarettes. She started smoking about 60 years ago. She has a 7.50 pack-year smoking history. Her smokeless tobacco use includes Chew. She reports that she does not drink alcohol or use drugs.   Family History  Problem Relation Age of Onset  . Diabetes Father   . Pancreatic cancer Father   . Hypertension Brother      No Known Allergies   Prior to Admission medications   Medication Sig Start Date End Date Taking? Authorizing Provider  acetaminophen (TYLENOL) 500 MG tablet Take 1,000 mg by mouth every 6 (six) hours as needed.    Yes [provider]  allopurinol (ZYLOPRIM) 100 MG tablet Take 1 tablet (100 mg total) by mouth daily. 08/21/16 11/19/16 Yes Panwala, Naitik, PA-C  aspirin EC 81 MG tablet Take 81 mg by mouth daily.   Yes [provider]  carvedilol (COREG) 6.25 MG tablet TAKE 1 TABLET BY MOUTH TWICE A DAY WITH MEALS 06/07/15  Yes Lendon Colonel, NP  colchicine 0.6 MG tablet Take 1 tablet (0.6 mg total) by mouth daily. 08/10/16 11/08/16 Yes Panwala, Naitik, PA-C  folic acid (FOLVITE) 1 MG tablet Take 1 mg by mouth daily.   Yes [provider]  furosemide (LASIX) 40 MG tablet Take 1 tablet by mouth twice a day.  May take extra 40 mg for weight gain of 3 lbs in 24 hrs 03/13/16  Yes Herminio Commons, MD  hydroxychloroquine (PLAQUENIL) 200 MG tablet Take 1 tablet (200 mg total) by mouth daily. Monday-Friday 09/14/16   Yes Panwala, Naitik, PA-C  KLOR-CON M10 10 MEQ tablet TAKE 1 TABLET BY MOUTH TWICE A DAY 08/29/15  Yes Lendon Colonel, NP  lisinopril (PRINIVIL,ZESTRIL) 20 MG tablet Take 10 mg by mouth daily.  04/27/16  Yes [provider]  lovastatin (MEVACOR) 40 MG tablet TAKE 1 TABLET (40 MG TOTAL) BY MOUTH AT BEDTIME. 08/19/15  Yes Lendon Colonel, NP  omeprazole (PRILOSEC) 20 MG capsule Take 20 mg by mouth daily. For gas/heartburn   Yes [provider]    Review of Systems:  Constitutional:  No weight loss, night sweats, Fevers, chills, fatigue.  Head&Eyes: No headache.  No vision loss.  No eye pain or scotoma ENT:  No Difficulty swallowing,Tooth/dental problems,Sore throat,  No ear ache, post nasal drip,  Cardio-vascular:  No chest pain, Orthopnea, PND, swelling in lower extremities,  dizziness, palpitations  GI:  No vomiting, diarrhea, loss of appetite, hematochezia, melena, heartburn, indigestion, Resp:  No shortness of breath with exertion or at rest. No cough. No coughing up of blood .No wheezing.No chest wall deformity  Skin:  no rash or lesions.  GU:  no dysuria, change in color of urine, no urgency or frequency. No flank pain.  Musculoskeletal:  No joint pain or swelling. No decreased range of motion. No back pain.  Psych:  No change in mood or affect. No  depression or anxiety. Neurologic: No headache, no dysesthesia, no focal weakness, no vision loss. No syncope  Physical Exam: Vitals:   09/20/16 1238 09/20/16 1239 09/20/16 1345 09/20/16 1531  BP: (!) 116/58  (!) 113/50 (!) 110/59  Pulse: 89  75 72  Resp: 16  16 16   Temp: 98.3 F (36.8 C)  98.8 F (37.1 C)   TempSrc: Oral  Oral   SpO2: 98%  100% 100%  Weight:  65.3 kg (144 lb)    Height:  5\' 5"  (1.651 m)     General:  A&O x 3, NAD, nontoxic, pleasant/cooperative Head/Eye: No conjunctival hemorrhage, no icterus, [place]/AT, No nystagmus ENT:  No icterus,  No thrush, good dentition, no pharyngeal  exudate Neck:  No masses, no lymphadenpathy, no bruits CV:  RRR, no rub, no gallop, no S3 Lung:  CTAB, good air movement, no wheeze, no rhonchi Abdomen: soft/NT, +BS, nondistended, no peritoneal signs Ext: No cyanosis, No rashes, No petechiae, No lymphangitis, No edema N  Labs on Admission:  Basic Metabolic Panel:  Recent Labs Lab 09/20/16 1250  NA 140  K 3.9  CL 106  CO2 24  GLUCOSE 112*  BUN 37*  CREATININE 1.53*  CALCIUM 8.9   Liver Function Tests:  Recent Labs Lab 09/20/16 1250  AST 37  ALT 22  ALKPHOS 117  BILITOT 0.1*  PROT 7.7  ALBUMIN 2.9*   No results for input(s): LIPASE, AMYLASE in the last 168 hours. No results for input(s): AMMONIA in the last 168 hours. CBC:  Recent Labs Lab 09/20/16 1250  WBC 5.1  HGB 7.5*  HCT 25.2*  MCV 79.7  PLT 288   Coagulation Profile: No results for input(s): INR, PROTIME in the last 168 hours. Cardiac Enzymes: No results for input(s): CKTOTAL, CKMB, CKMBINDEX, TROPONINI in the last 168 hours. BNP: Invalid input(s): POCBNP CBG: No results for input(s): GLUCAP in the last 168 hours. Urine analysis:    Component Value Date/Time   COLORURINE YELLOW 03/06/2016 [place] 03/06/2016 1201   LABSPEC 1.017 03/06/2016 1201   PHURINE 5.0 03/06/2016 1201   GLUCOSEU NEGATIVE 03/06/2016 1201   HGBUR NEGATIVE 03/06/2016 1201   BILIRUBINUR NEGATIVE 03/06/2016 1201   KETONESUR NEGATIVE 03/06/2016 1201   PROTEINUR NEGATIVE 03/06/2016 1201   UROBILINOGEN 0.2 07/17/2013 2240   NITRITE NEGATIVE 03/06/2016 1201   LEUKOCYTESUR 3+ (A) 03/06/2016 1201   Sepsis Labs: @LABRCNTIP (procalcitonin:4,lacticidven:4) )No results found for this or any previous visit (from the past 240 hour(s)).   Radiological Exams on Admission: No results found.      Time spent:60 minutes Code Status:   FULL Family Communication:  No Family at bedside Disposition Plan: expect 1-2 day hospitalization Consults called:  GI-Rehman DVT Prophylaxis: [place] Lovenox  Livvy Spilman, DO  [facility] Pager [phone]  If 7PM-7AM, please contact night-coverage [email or web address] Password [facility] 09/20/2016, 6:51 PM

## 2016-09-20 NOTE — ED Provider Notes (Signed)
[place] DEPT Provider Note   CSN: [id number] Arrival date & time: 09/20/16  1219     History   Chief Complaint Chief Complaint  Patient presents with  . Rectal Bleeding    HPI ARABEL BARCENAS is a 76 y.o. female.  HPI  76 year old female with dark stools. Ongoing on and off for 1.5 weeks. Last happened today, twice. Abdominal pain and nausea comes and goes when she eats or just after eating. Takes an aspirin, no other blood thinners or NSAIDs. No current abd pain. No dizziness or chest pain. No vomiting or hematemesis. No bright red blood, describes it as dark.   Past Medical History:  Diagnosis Date  . Anemia    Hgb of 9-10  . Anemia of chronic disease    Hgb of 9-10 chronically; 06/2010: H&H-10.7/33.5, MCV-81, normal iron studies in 2010   . Arteriosclerotic cardiovascular disease (ASCVD)    Remote PTCA by patient report; LBBB; associated cardiomyopathy, presumed ischemic with EF 40-45% previously, 20% in 06/2009; h/o clinical congestive heart failure; negative stress nuclear in 2009 with inferoseptal and apical scar  . Automatic implantable cardioverter-defibrillator in situ   . Breast cancer ([place])    breast  . Chronic bronchitis ([place])   . Chronic renal disease, stage 3, moderately decreased glomerular filtration rate (GFR) between 30-59 mL/min/1.73 square meter 02/01/2016  . Congestive heart failure (CHF) ([place])   . Elevated cholesterol   . Elevated sed rate   . GERD (gastroesophageal reflux disease)   . Gout   . HOH (hard of hearing)   . Hyperlipidemia   . Hypertension   . Rheumatoid arthritis [facility])     Patient Active Problem List   Diagnosis Date Noted  . Acute blood loss anemia 09/20/2016  . History of breast cancer 08/02/2016  . Primary osteoarthritis of both knees 08/02/2016  . Primary osteoarthritis of both feet 08/02/2016  . History of anemia 07/17/2016  . History of CHF (congestive heart failure) 06/04/2016  . History of chronic kidney disease  06/04/2016  . Symptomatic anemia 06/04/2016  . Elevated sedimentation rate 03/03/2016  . Idiopathic chronic gout of multiple sites without tophus 03/03/2016  . Total knee replacement status, right 03/03/2016  . High risk medication use 03/03/2016  . Chronic kidney disease (CKD) stage G3b/A1, moderately decreased glomerular filtration rate (GFR) between 30-44 mL/min/1.73 square meter and albuminuria creatinine ratio less than 30 mg/g 02/01/2016  . CAD S/P remote PCI- no details 09/02/2014  . Cardiomyopathy, ischemic-EF 30-35% March 2015 09/02/2014  . Diastolic dysfunction-grade 2 09/02/2014  . CHF exacerbation ([place]) 08/28/2014  . Acute on chronic combined systolic and diastolic congestive heart failure ([place]) 08/28/2014  . Iron deficiency anemia 08/20/2013  . Malnutrition of moderate degree ([place]) 07/21/2013  . PNA (pneumonia) 07/19/2013  . HCAP (healthcare-associated pneumonia) 07/17/2013  . Sepsis ([place]) 07/17/2013  . ARF (acute renal failure) ([place]) 07/17/2013  . Rheumatoid arthritis ([place]) 07/04/2013  . Hypokalemia 07/03/2013  . Chest pain 07/03/2013  . Biventricular ICD in place (MDT 2014) 11/06/2012  . Anemia of chronic disease   . Breast cancer ([place])   . Hypertension   . Dyslipidemia 05/24/2009  . Chronic systolic heart failure ([place]) 05/24/2009  . Primary osteoarthritis of both hands 08/11/2007    Past Surgical History:  Procedure Laterality Date  . BI-VENTRICULAR IMPLANTABLE CARDIOVERTER DEFIBRILLATOR N/A 07/28/2012   Procedure: BI-VENTRICULAR IMPLANTABLE CARDIOVERTER DEFIBRILLATOR  (CRT-D);  Surgeon: Evans Lance, MD;  Location: [facility] CATH LAB;  Service: Cardiovascular;  Laterality:  N/A;  . BI-VENTRICULAR IMPLANTABLE CARDIOVERTER DEFIBRILLATOR  (CRT-D)  07/28/2012  . BREAST BIOPSY Bilateral   . CATARACT EXTRACTION W/ INTRAOCULAR LENS IMPLANT Left   . CATARACT EXTRACTION W/PHACO Right 09/15/2013   Procedure: CATARACT EXTRACTION PHACO AND INTRAOCULAR LENS PLACEMENT (IOC);  Surgeon:  Elta Guadeloupe T. Gershon Crane, MD;  Location: [facility] ORS;  Service: Ophthalmology;  Laterality: Right;  CDE:  10.74  . COLONOSCOPY N/A 03/04/2015   Procedure: COLONOSCOPY;  Surgeon: Rogene Houston, MD;  Location: [facility] ENDO SUITE;  Service: Endoscopy;  Laterality: N/A;  10:50   . ESOPHAGOGASTRODUODENOSCOPY N/A 03/04/2015   Procedure: ESOPHAGOGASTRODUODENOSCOPY (EGD);  Surgeon: Rogene Houston, MD;  Location: [facility] ENDO SUITE;  Service: Endoscopy;  Laterality: N/A;  . MASTECTOMY Left 1998  . TOTAL KNEE ARTHROPLASTY Right    Dr.Harrison  . TUBAL LIGATION      OB History    Gravida Para Term Preterm AB Living   1 1 1      0   SAB TAB Ectopic Multiple Live Births                   Home Medications    Prior to Admission medications   Medication Sig Start Date End Date Taking? Authorizing Provider  acetaminophen (TYLENOL) 500 MG tablet Take 1,000 mg by mouth every 6 (six) hours as needed.    Yes [provider]  allopurinol (ZYLOPRIM) 100 MG tablet Take 1 tablet (100 mg total) by mouth daily. 08/21/16 11/19/16 Yes Panwala, Naitik, PA-C  aspirin EC 81 MG tablet Take 81 mg by mouth daily.   Yes [provider]  carvedilol (COREG) 6.25 MG tablet TAKE 1 TABLET BY MOUTH TWICE A DAY WITH MEALS 06/07/15  Yes Lendon Colonel, NP  colchicine 0.6 MG tablet Take 1 tablet (0.6 mg total) by mouth daily. 08/10/16 11/08/16 Yes Panwala, Naitik, PA-C  folic acid (FOLVITE) 1 MG tablet Take 1 mg by mouth daily.   Yes [provider]  furosemide (LASIX) 40 MG tablet Take 1 tablet by mouth twice a day.  May take extra 40 mg for weight gain of 3 lbs in 24 hrs 03/13/16  Yes Herminio Commons, MD  hydroxychloroquine (PLAQUENIL) 200 MG tablet Take 1 tablet (200 mg total) by mouth daily. Monday-Friday 09/14/16  Yes Panwala, Naitik, PA-C  KLOR-CON M10 10 MEQ tablet TAKE 1 TABLET BY MOUTH TWICE A DAY 08/29/15  Yes Lendon Colonel, NP  lisinopril (PRINIVIL,ZESTRIL) 20 MG tablet Take 10 mg by mouth daily.  04/27/16   Yes [provider]  lovastatin (MEVACOR) 40 MG tablet TAKE 1 TABLET (40 MG TOTAL) BY MOUTH AT BEDTIME. 08/19/15  Yes Lendon Colonel, NP  omeprazole (PRILOSEC) 20 MG capsule Take 20 mg by mouth daily. For gas/heartburn   Yes [provider]    Family History Family History  Problem Relation Age of Onset  . Diabetes Father   . Pancreatic cancer Father   . Hypertension Brother     Social History Social History  Substance Use Topics  . Smoking status: Former Smoker    Packs/day: 0.25    Years: 30.00    Types: Cigarettes    Start date: 09/17/1956    Quit date: 04/24/2007  . Smokeless tobacco: Current User    Types: Chew     Comment: 07/03/2013 "smoked some; don't know how much or how long or when I quit"  . Alcohol use No     Allergies   Patient has no known allergies.  Review of Systems Review of Systems  Cardiovascular: Negative for chest pain.  Gastrointestinal: Positive for abdominal pain and nausea. Negative for diarrhea and vomiting.  Neurological: Negative for light-headedness.  All other systems reviewed and are negative.    Physical Exam Updated Vital Signs BP (!) 110/59 (BP Location: Right Arm)   Pulse 72   Temp 98.8 F (37.1 C) (Oral)   Resp 16   Ht 5\' 5"  (1.651 m)   Wt 65.3 kg (144 lb)   SpO2 100%   BMI 23.96 kg/m   Physical Exam  Constitutional: She is oriented to person, place, and time. She appears well-developed and well-nourished.  HENT:  Head: Normocephalic and atraumatic.  Right Ear: External ear normal.  Left Ear: External ear normal.  Nose: Nose normal.  Eyes: Right eye exhibits no discharge. Left eye exhibits no discharge.  Cardiovascular: Normal rate, regular rhythm and normal heart sounds.   Pulmonary/Chest: Effort normal and breath sounds normal.  Abdominal: Soft. She exhibits no distension. There is no tenderness.  Genitourinary: Rectal exam shows guaiac positive stool. Rectal exam shows no external hemorrhoid,  no internal hemorrhoid, no fissure, no mass, no tenderness and anal tone normal.  Neurological: She is alert and oriented to person, place, and time.  Skin: Skin is warm and dry.  Nursing note and vitals reviewed.    ED Treatments / Results  Labs (all labs ordered are listed, but only abnormal results are displayed) Labs Reviewed  COMPREHENSIVE METABOLIC PANEL - Abnormal; Notable for the following:       Result Value   Glucose, Bld 112 (*)    BUN 37 (*)    Creatinine, Ser 1.53 (*)    Albumin 2.9 (*)    Total Bilirubin 0.1 (*)    GFR calc non Af Amer 31 (*)    GFR calc Af Amer 36 (*)    All other components within normal limits  CBC - Abnormal; Notable for the following:    RBC 3.16 (*)    Hemoglobin 7.5 (*)    HCT 25.2 (*)    MCH 23.7 (*)    MCHC 29.8 (*)    RDW 19.8 (*)    All other components within normal limits  POC OCCULT BLOOD, ED - Abnormal; Notable for the following:    Fecal Occult Bld POSITIVE (*)    All other components within normal limits  POC OCCULT BLOOD, ED  TYPE AND SCREEN    EKG  EKG Interpretation None       Radiology No results found.  Procedures Procedures (including critical care time)  Medications Ordered in ED Medications  pantoprazole (PROTONIX) injection 40 mg (40 mg Intravenous Given 09/20/16 1527)     Initial Impression / Assessment and Plan / ED Course  I have reviewed the triage vital signs and the nursing notes.  Pertinent labs & imaging results that were available during my care of the patient were reviewed by me and considered in my medical decision making (see chart for details).     Patient has dark stool on exam, not black or tarry. Symptoms concerning for gastritis. Has prior AVM in duodenum on scope by Dr. Laural Golden in 2016. Currently HDS. Dr. Laural Golden to scope tomorrow, clear liquids for now. Dr. Carles Collet to admit. IV protonix.  Final Clinical Impressions(s) / ED Diagnoses   Final diagnoses:  Acute GI bleeding    New  Prescriptions New Prescriptions   No medications on file     Sherwood Gambler, MD  09/20/16 1652  

## 2016-09-21 ENCOUNTER — Encounter (HOSPITAL_COMMUNITY)
Admission: EM | Disposition: A | Discharge status: Home / Self Care | Payer: Self-pay | Source: Home / Self Care | Attending: Internal Medicine

## 2016-09-21 ENCOUNTER — Encounter (HOSPITAL_COMMUNITY): Payer: Self-pay | Admitting: *Deleted

## 2016-09-21 DIAGNOSIS — K921 Melena: Secondary | ICD-10-CM | POA: Diagnosis not present

## 2016-09-21 DIAGNOSIS — D62 Acute posthemorrhagic anemia: Secondary | ICD-10-CM | POA: Diagnosis not present

## 2016-09-21 DIAGNOSIS — K922 Gastrointestinal hemorrhage, unspecified: Secondary | ICD-10-CM

## 2016-09-21 DIAGNOSIS — K31819 Angiodysplasia of stomach and duodenum without bleeding: Secondary | ICD-10-CM | POA: Diagnosis not present

## 2016-09-21 HISTORY — PX: ESOPHAGOGASTRODUODENOSCOPY: SHX5428

## 2016-09-21 LAB — CBC
HCT: 23.7 % — ABNORMAL LOW (ref 36.0–46.0)
Hemoglobin: 7.4 g/dL — ABNORMAL LOW (ref 12.0–15.0)
MCH: 24.3 pg — AB (ref 26.0–34.0)
MCHC: 31.2 g/dL (ref 30.0–36.0)
MCV: 77.7 fL — ABNORMAL LOW (ref 78.0–100.0)
PLATELETS: 280 10*3/uL (ref 150–400)
RBC: 3.05 MIL/uL — AB (ref 3.87–5.11)
RDW: 20.3 % — ABNORMAL HIGH (ref 11.5–15.5)
WBC: 4.1 10*3/uL (ref 4.0–10.5)

## 2016-09-21 LAB — BASIC METABOLIC PANEL
Anion gap: 10 (ref 5–15)
BUN: 26 mg/dL — AB (ref 6–20)
CHLORIDE: 106 mmol/L (ref 101–111)
CO2: 24 mmol/L (ref 22–32)
CREATININE: 1.05 mg/dL — AB (ref 0.44–1.00)
Calcium: 8.4 mg/dL — ABNORMAL LOW (ref 8.9–10.3)
GFR calc non Af Amer: 50 mL/min — ABNORMAL LOW (ref >60)
GFR, EST AFRICAN AMERICAN: 57 mL/min — AB (ref >60)
Glucose, Bld: 76 mg/dL (ref 65–99)
POTASSIUM: 3.7 mmol/L (ref 3.5–5.1)
SODIUM: 140 mmol/L (ref 135–145)

## 2016-09-21 LAB — HEMOGLOBIN AND HEMATOCRIT, BLOOD
HEMATOCRIT: 31.1 % — AB (ref 36.0–46.0)
HEMOGLOBIN: 9.8 g/dL — AB (ref 12.0–15.0)

## 2016-09-21 LAB — PREPARE RBC (CROSSMATCH)

## 2016-09-21 SURGERY — EGD (ESOPHAGOGASTRODUODENOSCOPY)
Anesthesia: Moderate Sedation

## 2016-09-21 MED ORDER — MUPIROCIN 2 % EX OINT
1.0000 "application " | TOPICAL_OINTMENT | Freq: Two times a day (BID) | CUTANEOUS | Status: DC
  Administered 2016-09-21 – 2016-09-25 (×9): 1 via NASAL
  Filled 2016-09-21: qty 22

## 2016-09-21 MED ORDER — LIDOCAINE VISCOUS 2 % MT SOLN
OROMUCOSAL | Status: DC | PRN
  Administered 2016-09-21: 3 mL via OROMUCOSAL

## 2016-09-21 MED ORDER — MEPERIDINE HCL 50 MG/ML IJ SOLN
INTRAMUSCULAR | Status: AC
  Filled 2016-09-21: qty 1

## 2016-09-21 MED ORDER — STERILE WATER FOR IRRIGATION IR SOLN
Status: DC | PRN
  Administered 2016-09-21: 15:00:00

## 2016-09-21 MED ORDER — MIDAZOLAM HCL 5 MG/5ML IJ SOLN
INTRAMUSCULAR | Status: DC | PRN
  Administered 2016-09-21 (×3): 1 mg via INTRAVENOUS

## 2016-09-21 MED ORDER — SODIUM CHLORIDE 0.9 % IV SOLN
Freq: Once | INTRAVENOUS | Status: AC
  Administered 2016-09-21: 12:00:00 via INTRAVENOUS

## 2016-09-21 MED ORDER — MEPERIDINE HCL 50 MG/ML IJ SOLN
INTRAMUSCULAR | Status: DC | PRN
  Administered 2016-09-21: 25 mg via INTRAVENOUS

## 2016-09-21 MED ORDER — SODIUM CHLORIDE 0.9 % IV SOLN
INTRAVENOUS | Status: DC
  Administered 2016-09-21: 1000 mL via INTRAVENOUS

## 2016-09-21 MED ORDER — LIDOCAINE VISCOUS 2 % MT SOLN
OROMUCOSAL | Status: AC
  Filled 2016-09-21: qty 15

## 2016-09-21 MED ORDER — MIDAZOLAM HCL 5 MG/5ML IJ SOLN
INTRAMUSCULAR | Status: AC
  Filled 2016-09-21: qty 10

## 2016-09-21 MED ORDER — CHLORHEXIDINE GLUCONATE CLOTH 2 % EX PADS
6.0000 | MEDICATED_PAD | Freq: Every day | CUTANEOUS | Status: AC
  Administered 2016-09-21 – 2016-09-25 (×5): 6 via TOPICAL

## 2016-09-21 NOTE — Care Management Obs Status (Signed)
[facility] NOTIFICATION   Patient Details  Name: [name] MRN: [id number] Date of Birth: [birth year]   Medicare Observation Status Notification Given:  Yes    Inger Wiest, Chauncey Reading, RN 09/21/2016, 9:56 AM

## 2016-09-21 NOTE — Care Management Note (Signed)
Case Management Note  Patient Details  Name: [name] MRN: [id number] Date of Birth: [birth year]  Subjective/Objective:   Adm from home with anemia. From home, ind PTA. No HH or DME PTA. Has PCP, still drives, reports no issues affording medications.                  Action/Plan: Plans to return home with self care.   Expected Discharge Date:    09/22/2016              Expected Discharge Plan:  Home/Self Care  In-House Referral:     Discharge planning Services  CM Consult  Post Acute Care Choice:    Choice offered to:  NA  DME Arranged:    DME Agency:     HH Arranged:    HH Agency:     Status of Service:  Completed, signed off  If discussed at [employer] of Stay Meetings, dates discussed:    Additional Comments:  [name], Savannah Reading, RN 09/21/2016, 12:05 PM

## 2016-09-21 NOTE — Progress Notes (Signed)
Patient seen and examined. Database reviewed. Admitted earlier today for melena. Underwent EGD without source. Discussed with Dr. Laural Golden: plan is to undergo a capsule study in am. She will receive 1 unit of PRBCs today. Will continue to follow.  Domingo Mend, MD [facility] Pager: [phone]

## 2016-09-21 NOTE — Op Note (Signed)
[facility] Patient Name: [name] Procedure Date: 09/21/2016 2:49 PM MRN: [id number] Date of Birth: May 16, 1940 Attending MD: Hildred Laser , MD CSN: [id number] Age: 76 Admit Type: Outpatient Procedure:                Upper GI endoscopy Indications:              Melena and anemia Providers:                Hildred Laser, MD, Otis Peak B. Sharon Seller, RN, Janeece Riggers, RN Referring MD:             Rande Lawman, MD Medicines:                Lidocaine spray, Meperidine 25 mg IV, Midazolam 3                            mg IV Complications:            No immediate complications. Estimated Blood Loss:     Estimated blood loss: none. Procedure:                Pre-Anesthesia Assessment:                           - Prior to the procedure, a History and Physical                            was performed, and patient medications and                            allergies were reviewed. The patient's tolerance of                            previous anesthesia was also reviewed. The risks                            and benefits of the procedure and the sedation                            options and risks were discussed with the patient.                            All questions were answered, and informed consent                            was obtained. ASA Grade Assessment: III - A patient                            with severe systemic disease. After reviewing the                            risks and benefits, the patient was deemed in  satisfactory condition to undergo the procedure.                           After obtaining informed consent, the endoscope was                            passed under direct vision. Throughout the                            procedure, the patient's blood pressure, pulse, and                            oxygen saturations were monitored continuously. The                            [id number] ([id number]) scope was  introduced through the                            mouth, and advanced to the second part of duodenum.                            The upper GI endoscopy was accomplished without                            difficulty. The patient tolerated the procedure                            well. Scope In: 3:02:44 PM Scope Out: 3:09:27 PM Total Procedure Duration: 0 hours 6 minutes 43 seconds  Findings:      The examined esophagus was normal.      The Z-line was regular and was found 38 cm from the incisors.      The entire examined stomach was normal.      The duodenal bulb was normal.      A single diminutive angiodysplastic lesion without bleeding was found in       the second portion of the duodenum. Impression:               - Normal esophagus.                           - Z-line regular, 38 cm from the incisors.                           - Normal stomach.                           - Normal duodenal bulb.                           - A single non-bleeding angiodysplastic lesion in                            the duodenum.                           - No  specimens collected. Moderate Sedation:      Moderate (conscious) sedation was administered by the endoscopy nurse       and supervised by the endoscopist. The following parameters were       monitored: oxygen saturation, heart rate, blood pressure, CO2       capnography and response to care. Total physician intraservice time was       10 minutes. Recommendation:           - Return patient to hospital ward for ongoing care.                           - Resume previous diet.                           - To visualize the small bowel, perform video                            capsule endoscopy tomorrow.                           - Patient will need to be monitored since she has                            AICD.                           - Continue present medications. Procedure Code(s):        --- Professional ---                           [phone],  Esophagogastroduodenoscopy, flexible,                            transoral; diagnostic, including collection of                            specimen(s) by brushing or washing, when performed                            (separate procedure)                           99152, Moderate sedation services provided by the                            same physician or other qualified health care                            professional performing the diagnostic or                            therapeutic service that the sedation supports,                            requiring the presence of an independent trained  observer to assist in the monitoring of the                            patient's level of consciousness and physiological                            status; initial 15 minutes of intraservice time,                            patient age 64 years or older Diagnosis Code(s):        --- Professional ---                           K27.819, Angiodysplasia of stomach and duodenum                            without bleeding                           K92.1, Melena (includes Hematochezia) CPT copyright 2016 American Medical Association. All rights reserved. The codes documented in this report are preliminary and upon coder review may  be revised to meet current compliance requirements. Hildred Laser, MD Hildred Laser, MD 09/21/2016 3:23:00 PM This report has been signed electronically. Number of Addenda: 0

## 2016-09-21 NOTE — Consult Note (Signed)
Referring Provider: Orson Eva, MD Primary Care Physician:  [name], MD Primary Gastroenterologist:  Dr. Laural Golden  Reason for Consultation:    Melena and anemia.  HPI:   Patient is 76 year old African-American female with multiple medical problems including anemia of chronic disease and ischemic cardiomyopathy who presented to emergency room yesterday with few day history of melena and postprandial epigastric pain and fullness. She has some nausea but no vomiting. She was noted to have heme-positive stool and her hemoglobin was 7.5 g. Patient's hemoglobin generally runs between 9 and 10 g. Patient was therefore hospitalized for further management. This morning her hemoglobin was 7.4 g and she was begun on the unit of PRBC. This morning she passed screen is stool. She states her symptoms began about 6 weeks ago. She has not had a good appetite. She says mild discomfort is an epigastric region and mid abdomen. She is on low-dose aspirin but does not take other OTC NSAIDs. She states she has lost few pounds recently.  Patient was last evaluated for GI bleed and iron deficiency anemia in November 2016. EGD revealed single small duodenal AV malformation without stigmata of bleeding and no lesion was identified on colonoscopy other than hemorrhoids. At that time she did receive 2 units of PRBCs.  Patient is a retired. She lives with her husband in [place]. She is able to perform usual household chores including cooking when she left to do. She does not smoke cigarettes or drink alcohol. She had one daughter who died in an auto accident at age 65. Patient's niece Helene Kelp is at bedside. Helene Kelp states she lives close by and stays in contact with her aunt regularly.     Past Medical History:  Diagnosis Date  . Anemia    Hgb of 9-10  . Anemia of chronic disease    Hgb of 9-10 chronically; 06/2010: H&H-10.7/33.5, MCV-81, normal iron studies in 2010   . Arteriosclerotic cardiovascular  disease (ASCVD)    Remote PTCA by patient report; LBBB; associated cardiomyopathy, presumed ischemic with EF 40-45% previously, 20% in 06/2009; h/o clinical congestive heart failure; negative stress nuclear in 2009 with inferoseptal and apical scar  . Automatic implantable cardioverter-defibrillator in situ   . Breast cancer ([place])    breast  . Chronic bronchitis ([place])   . Chronic renal disease, stage 3, moderately decreased glomerular filtration rate (GFR) between 30-59 mL/min/1.73 square meter 02/01/2016  . Congestive heart failure (CHF) ([place])   . Elevated cholesterol   . Elevated sed rate   . GERD (gastroesophageal reflux disease)   . Gout   . HOH (hard of hearing)   . Hyperlipidemia   . Hypertension   . Rheumatoid arthritis [facility])     Past Surgical History:  Procedure Laterality Date  . BI-VENTRICULAR IMPLANTABLE CARDIOVERTER DEFIBRILLATOR N/A 07/28/2012   Procedure: BI-VENTRICULAR IMPLANTABLE CARDIOVERTER DEFIBRILLATOR  (CRT-D);  Surgeon: Evans Lance, MD;  Location: [facility] CATH LAB;  Service: Cardiovascular;  Laterality: N/A;  . BI-VENTRICULAR IMPLANTABLE CARDIOVERTER DEFIBRILLATOR  (CRT-D)  07/28/2012  . BREAST BIOPSY Bilateral   . CATARACT EXTRACTION W/ INTRAOCULAR LENS IMPLANT Left   . CATARACT EXTRACTION W/PHACO Right 09/15/2013   Procedure: CATARACT EXTRACTION PHACO AND INTRAOCULAR LENS PLACEMENT (IOC);  Surgeon: Elta Guadeloupe T. Gershon Crane, MD;  Location: [facility] ORS;  Service: Ophthalmology;  Laterality: Right;  CDE:  10.74  . COLONOSCOPY N/A 03/04/2015   Procedure: COLONOSCOPY;  Surgeon: Rogene Houston, MD;  Location: [facility] ENDO SUITE;  Service: Endoscopy;  Laterality: N/A;  10:50   .  ESOPHAGOGASTRODUODENOSCOPY N/A 03/04/2015   Procedure: ESOPHAGOGASTRODUODENOSCOPY (EGD);  Surgeon: Rogene Houston, MD;  Location: [facility] ENDO SUITE;  Service: Endoscopy;  Laterality: N/A;  . MASTECTOMY Left 1998  . TOTAL KNEE ARTHROPLASTY Right    Dr.Harrison  . TUBAL LIGATION      Prior to Admission medications    Medication Sig Start Date End Date Taking? Authorizing Provider  acetaminophen (TYLENOL) 500 MG tablet Take 1,000 mg by mouth every 6 (six) hours as needed.    Yes [provider]  allopurinol (ZYLOPRIM) 100 MG tablet Take 1 tablet (100 mg total) by mouth daily. 08/21/16 11/19/16 Yes Panwala, Naitik, PA-C  aspirin EC 81 MG tablet Take 81 mg by mouth daily.   Yes [provider]  carvedilol (COREG) 6.25 MG tablet TAKE 1 TABLET BY MOUTH TWICE A DAY WITH MEALS 06/07/15  Yes Lendon Colonel, NP  colchicine 0.6 MG tablet Take 1 tablet (0.6 mg total) by mouth daily. 08/10/16 11/08/16 Yes Panwala, Naitik, PA-C  folic acid (FOLVITE) 1 MG tablet Take 1 mg by mouth daily.   Yes [provider]  furosemide (LASIX) 40 MG tablet Take 1 tablet by mouth twice a day.  May take extra 40 mg for weight gain of 3 lbs in 24 hrs 03/13/16  Yes Herminio Commons, MD  hydroxychloroquine (PLAQUENIL) 200 MG tablet Take 1 tablet (200 mg total) by mouth daily. Monday-Friday 09/14/16  Yes Panwala, Naitik, PA-C  KLOR-CON M10 10 MEQ tablet TAKE 1 TABLET BY MOUTH TWICE A DAY 08/29/15  Yes Lendon Colonel, NP  lisinopril (PRINIVIL,ZESTRIL) 20 MG tablet Take 10 mg by mouth daily.  04/27/16  Yes [provider]  lovastatin (MEVACOR) 40 MG tablet TAKE 1 TABLET (40 MG TOTAL) BY MOUTH AT BEDTIME. 08/19/15  Yes Lendon Colonel, NP  omeprazole (PRILOSEC) 20 MG capsule Take 20 mg by mouth daily. For gas/heartburn   Yes [provider]    Current Facility-Administered Medications  Medication Dose Route Frequency Provider Last Rate Last Dose  . 0.9 % NaCl with KCl 20 mEq/ L  infusion   Intravenous Continuous Tat, David, MD   Stopped at 09/21/16 1132  . acetaminophen (TYLENOL) tablet 650 mg  650 mg Oral Q6H PRN Tat, Shanon Brow, MD   650 mg at 09/20/16 2219   Or  . acetaminophen (TYLENOL) suppository 650 mg  650 mg Rectal Q6H PRN Tat, Shanon Brow, MD      . allopurinol (ZYLOPRIM) tablet 100 mg  100 mg  Oral Daily Tat, David, MD   100 mg at 09/21/16 1133  . carvedilol (COREG) tablet 3.125 mg  3.125 mg Oral BID WC Tat, Shanon Brow, MD   3.125 mg at 09/21/16 1133  . Chlorhexidine Gluconate Cloth 2 % PADS 6 each  6 each Topical Q0600 Tat, David, MD   6 each at 09/21/16 0600  . folic acid (FOLVITE) tablet 1 mg  1 mg Oral Daily Tat, David, MD   1 mg at 09/21/16 1133  . hydroxychloroquine (PLAQUENIL) tablet 200 mg  200 mg Oral Once per day on Mon Tue Wed Thu Fri Tat, David, MD      . mupirocin ointment (BACTROBAN) 2 % 1 application  1 application Nasal BID Orson Eva, MD   1 application at 92/42/68 1133  . ondansetron (ZOFRAN) tablet 4 mg  4 mg Oral Q6H PRN Tat, David, MD       Or  . ondansetron (ZOFRAN) injection 4 mg  4 mg Intravenous Q6H PRN Tat,  Shanon Brow, MD      . pantoprazole (PROTONIX) injection 40 mg  40 mg Intravenous Therisa Doyne, MD   40 mg at 09/21/16 1133  . pravastatin (PRAVACHOL) tablet 40 mg  40 mg Oral q1800 Orson Eva, MD        Allergies as of 09/20/2016  . (No Known Allergies)    Family History  Problem Relation Age of Onset  . Diabetes Father   . Pancreatic cancer Father   . Hypertension Brother     Social History   Social History  . Marital status: Married    Spouse name: N/A  . Number of children: N/A  . Years of education: N/A   Occupational History  . Retired    Social History Main Topics  . Smoking status: Former Smoker    Packs/day: 0.25    Years: 30.00    Types: Cigarettes    Start date: 09/17/1956    Quit date: 04/24/2007  . Smokeless tobacco: Current User    Types: Chew     Comment: 07/03/2013 "smoked some; don't know how much or how long or when I quit"  . Alcohol use No  . Drug use: No  . Sexual activity: Not Currently    Birth control/ protection: Post-menopausal   Other Topics Concern  . Not on file   Social History Narrative   Married, lives with spouse   1 daughter, died in 3 in a car accident   Retired - worked in [employer] fund[employer]   No recent  travel    Review of Systems: See HPI, otherwise normal ROS  Physical Exam: Temp:  [98 F (36.7 C)-100 F (37.8 C)] 98 F (36.7 C) (06/01 0500) Pulse Rate:  [71-89] 71 (06/01 0500) Resp:  [16-18] 18 (06/01 0500) BP: (110-137)/(50-91) 127/58 (06/01 0500) SpO2:  [97 %-100 %] 99 % (06/01 0500) Weight:  [143 lb 15.4 oz (65.3 kg)-144 lb (65.3 kg)] 143 lb 15.4 oz (65.3 kg) (06/01 0500) Last BM Date: 09/20/16  Patient is alert and in no acute distress. Conjunctiva is pale. Sclerae nonicteric. Oropharyngeal mucosa is normal. No neck masses or thyromegaly noted. Cardiac exam with regular rhythm normal S1 and S2. Faint systolic ejection murmur noted at left upper sternal border and aortic area. Lungs are clear to auscultation. Abdomen is full. Bowel sounds are normal. No bruits noted. On palpation abdomen is soft. Mild tenderness noted in [place] emboli can region as well as epigastric region. No organomegaly or masses noted. Rectal examination deferred. No peripheral edema or clubbing noted.   Lab Results:  Recent Labs  09/20/16 1250 09/21/16 0543  WBC 5.1 4.1  HGB 7.5* 7.4*  HCT 25.2* 23.7*  PLT 288 280   BMET  Recent Labs  09/20/16 1250 09/21/16 0543  NA 140 140  K 3.9 3.7  CL 106 106  CO2 24 24  GLUCOSE 112* 76  BUN 37* 26*  CREATININE 1.53* 1.05*  CALCIUM 8.9 8.4*   LFT  Recent Labs  09/20/16 1250  PROT 7.7  ALBUMIN 2.9*  AST 37  ALT 22  ALKPHOS 117  BILITOT 0.1*   PT/INR  Recent Labs  09/20/16 1812  LABPROT 13.9  INR 1.06    Assessment;  Patient is 76 year old African-American female with multiple medical problems who presents with few day history of melena as well as postprandial epigastric pain and fullness. She has chronic anemia with hemoglobin ranging between 9 and 10 g but her hemoglobin yesterday was noted to be 7.5 g.  She is finishing up first unit of PRBCs. She has history of GI bleed and anemia for which she was evaluated in November  2016 with EGD and colonoscopy without significant findings. She is on low-dose aspirin. She therefore could have peptic ulcer disease given history of epigastric pain and postprandial fullness. If this is not the case she may need further evaluation.  Recommendations;  Diagnostic esophagogastroduodenoscopy later today. Other recommendations to follow.   LOS: 0 days   Yuriy Cui  09/21/2016, 11:42 AM

## 2016-09-22 ENCOUNTER — Encounter (HOSPITAL_COMMUNITY)
Admission: EM | Disposition: A | Discharge status: Home / Self Care | Payer: Self-pay | Source: Home / Self Care | Attending: Internal Medicine

## 2016-09-22 DIAGNOSIS — K922 Gastrointestinal hemorrhage, unspecified: Secondary | ICD-10-CM | POA: Diagnosis present

## 2016-09-22 DIAGNOSIS — E785 Hyperlipidemia, unspecified: Secondary | ICD-10-CM | POA: Diagnosis present

## 2016-09-22 DIAGNOSIS — I5042 Chronic combined systolic (congestive) and diastolic (congestive) heart failure: Secondary | ICD-10-CM | POA: Diagnosis present

## 2016-09-22 DIAGNOSIS — Z95810 Presence of automatic (implantable) cardiac defibrillator: Secondary | ICD-10-CM | POA: Diagnosis not present

## 2016-09-22 DIAGNOSIS — K31819 Angiodysplasia of stomach and duodenum without bleeding: Secondary | ICD-10-CM | POA: Diagnosis present

## 2016-09-22 DIAGNOSIS — Z8249 Family history of ischemic heart disease and other diseases of the circulatory system: Secondary | ICD-10-CM | POA: Diagnosis not present

## 2016-09-22 DIAGNOSIS — M109 Gout, unspecified: Secondary | ICD-10-CM | POA: Diagnosis present

## 2016-09-22 DIAGNOSIS — H9190 Unspecified hearing loss, unspecified ear: Secondary | ICD-10-CM | POA: Diagnosis present

## 2016-09-22 DIAGNOSIS — N183 Chronic kidney disease, stage 3 (moderate): Secondary | ICD-10-CM

## 2016-09-22 DIAGNOSIS — D62 Acute posthemorrhagic anemia: Secondary | ICD-10-CM | POA: Diagnosis present

## 2016-09-22 DIAGNOSIS — K921 Melena: Secondary | ICD-10-CM | POA: Diagnosis not present

## 2016-09-22 DIAGNOSIS — Z96651 Presence of right artificial knee joint: Secondary | ICD-10-CM | POA: Diagnosis present

## 2016-09-22 DIAGNOSIS — Z800 Family history of malignant neoplasm of digestive organs: Secondary | ICD-10-CM | POA: Diagnosis not present

## 2016-09-22 DIAGNOSIS — I130 Hypertensive heart and chronic kidney disease with heart failure and stage 1 through stage 4 chronic kidney disease, or unspecified chronic kidney disease: Secondary | ICD-10-CM | POA: Diagnosis present

## 2016-09-22 DIAGNOSIS — Z853 Personal history of malignant neoplasm of breast: Secondary | ICD-10-CM | POA: Diagnosis not present

## 2016-09-22 DIAGNOSIS — Z87891 Personal history of nicotine dependence: Secondary | ICD-10-CM | POA: Diagnosis not present

## 2016-09-22 DIAGNOSIS — Z7982 Long term (current) use of aspirin: Secondary | ICD-10-CM | POA: Diagnosis not present

## 2016-09-22 DIAGNOSIS — K5521 Angiodysplasia of colon with hemorrhage: Secondary | ICD-10-CM | POA: Diagnosis present

## 2016-09-22 DIAGNOSIS — Z9012 Acquired absence of left breast and nipple: Secondary | ICD-10-CM | POA: Diagnosis not present

## 2016-09-22 DIAGNOSIS — M069 Rheumatoid arthritis, unspecified: Secondary | ICD-10-CM | POA: Diagnosis present

## 2016-09-22 DIAGNOSIS — I2510 Atherosclerotic heart disease of native coronary artery without angina pectoris: Secondary | ICD-10-CM | POA: Diagnosis present

## 2016-09-22 DIAGNOSIS — K219 Gastro-esophageal reflux disease without esophagitis: Secondary | ICD-10-CM | POA: Diagnosis present

## 2016-09-22 DIAGNOSIS — I255 Ischemic cardiomyopathy: Secondary | ICD-10-CM | POA: Diagnosis present

## 2016-09-22 HISTORY — PX: GIVENS CAPSULE STUDY: SHX5432

## 2016-09-22 LAB — BASIC METABOLIC PANEL
Anion gap: 7 (ref 5–15)
BUN: 15 mg/dL (ref 6–20)
CHLORIDE: 111 mmol/L (ref 101–111)
CO2: 23 mmol/L (ref 22–32)
Calcium: 8.2 mg/dL — ABNORMAL LOW (ref 8.9–10.3)
Creatinine, Ser: 0.9 mg/dL (ref 0.44–1.00)
GFR calc Af Amer: >60 mL/min (ref >60)
GFR calc non Af Amer: 60 mL/min — ABNORMAL LOW (ref >60)
GLUCOSE: 96 mg/dL (ref 65–99)
POTASSIUM: 3.9 mmol/L (ref 3.5–5.1)
SODIUM: 141 mmol/L (ref 135–145)

## 2016-09-22 LAB — TYPE AND SCREEN
ABO/RH(D): AB POS
Antibody Screen: NEGATIVE
Unit division: 0

## 2016-09-22 LAB — CBC
HEMATOCRIT: 28.9 % — AB (ref 36.0–46.0)
Hemoglobin: 9 g/dL — ABNORMAL LOW (ref 12.0–15.0)
MCH: 24.9 pg — AB (ref 26.0–34.0)
MCHC: 31.1 g/dL (ref 30.0–36.0)
MCV: 80.1 fL (ref 78.0–100.0)
Platelets: 240 10*3/uL (ref 150–400)
RBC: 3.61 MIL/uL — ABNORMAL LOW (ref 3.87–5.11)
RDW: 19.2 % — AB (ref 11.5–15.5)
WBC: 5.5 10*3/uL (ref 4.0–10.5)

## 2016-09-22 LAB — BPAM RBC
BLOOD PRODUCT EXPIRATION DATE: 201806122359
ISSUE DATE / TIME: 201806011220
Unit Type and Rh: 6200

## 2016-09-22 SURGERY — IMAGING PROCEDURE, GI TRACT, INTRALUMINAL, VIA CAPSULE

## 2016-09-22 MED ORDER — PANTOPRAZOLE SODIUM 40 MG PO TBEC
40.0000 mg | DELAYED_RELEASE_TABLET | Freq: Every day | ORAL | Status: DC
  Administered 2016-09-23 – 2016-09-25 (×3): 40 mg via ORAL
  Filled 2016-09-22 (×3): qty 1

## 2016-09-22 MED ORDER — SIMETHICONE 40 MG/0.6ML PO SUSP
ORAL | Status: AC
  Filled 2016-09-22: qty 30

## 2016-09-22 MED ORDER — SODIUM CHLORIDE 0.9 % IV SOLN: INTRAVENOUS | Status: DC

## 2016-09-22 MED ORDER — LISINOPRIL 5 MG PO TABS
5.0000 mg | ORAL_TABLET | Freq: Every day | ORAL | Status: DC
  Administered 2016-09-22 – 2016-09-25 (×4): 5 mg via ORAL
  Filled 2016-09-22 (×4): qty 1

## 2016-09-22 NOTE — Progress Notes (Signed)
  Subjective:  Patient has no complaints. She says she had no difficulty swallowing given capsule. She denies nausea vomiting abdominal pain. She states she had a bowel movement this morning and was not black.  Objective: Blood pressure (!) 130/50, pulse 77, temperature 98.7 F (37.1 C), temperature source Oral, resp. rate 16, height 5\' 5"  (1.651 m), weight 154 lb (69.9 kg), SpO2 98 %. Patient is alert and in no acute distress. She has some hearing impairment. Abdomen is full but soft and nontender without organomegaly or masses. No LE edema or clubbing noted.  Labs/studies Results:   Recent Labs  09/20/16 1250 09/21/16 0543 09/21/16 1647 09/22/16 0511  WBC 5.1 4.1  --  5.5  HGB 7.5* 7.4* 9.8* 9.0*  HCT 25.2* 23.7* 31.1* 28.9*  PLT 288 280  --  240    BMET   Recent Labs  09/20/16 1250 09/21/16 0543 09/22/16 0511  NA 140 140 141  K 3.9 3.7 3.9  CL 106 106 111  CO2 24 24 23   GLUCOSE 112* 76 96  BUN 37* 26* 15  CREATININE 1.53* 1.05* 0.90  CALCIUM 8.9 8.4* 8.2*    LFT   Recent Labs  09/20/16 1250  PROT 7.7  ALBUMIN 2.9*  AST 37  ALT 22  ALKPHOS 117  BILITOT 0.1*    PT/INR   Recent Labs  09/20/16 1812  LABPROT 13.9  INR 1.06     Assessment:  #1.GI bleed. Patient presented with melena and low hemoglobin. She underwent EGD yesterday revealing small duodenal AV malformation without stigmata of bleed. She is undergoing small bowel given capsule study. Review of images reveal capsule to be in small bowel. Study would be completed later today and I will read it tomorrow morning.  #2. Anemia. Acute on chronic anemia secondary to GI bleed. Patient has received one unit of PRBCs. Admission Hgb was 7.5 and today is 9.0 g.   Recommendations:  Change pantoprazole to oral route. Will review small bowel given capsule study tomorrow morning.

## 2016-09-22 NOTE — Progress Notes (Signed)
PROGRESS NOTE    [name]  ZLD:[id number] DOB: 1940/06/24 DOA: 09/20/2016 PCP: [name], MD     Brief Narrative:  76 y/o woman admitted from home on 5/31 due to melena and a low hemoglobin. Admission requested.   Assessment & Plan:   Active Problems:   Dyslipidemia   Biventricular ICD in place (MDT 2014)   Acute blood loss anemia   Ischemic cardiomyopathy   Chronic combined systolic and diastolic CHF (congestive heart failure) (HCC)   CKD (chronic kidney disease) stage 3, GFR 30-59 ml/min   Melena/Acute Blood Loss Anemia -Has been transfused 1 units of PRBCs. -Hb is around 9. -Underwent EGD on 6/1 with small duodenal AVM without stigmata of bleeding. -Is currently undergoing a capsule study. -Appreciate GI input and recommendations. -Continue PPI.  Chronic Combined CHF -ECHO 3/17: EF 30-35% with grade 1 diastolic dysfunction and diffuse hypokinesis. -Compensated at present. -Is s/p AICD.  CKD Stage III -Cr is better than baseline. -Will restart lisinopril at low dose given depressed EF.  Hyperlipidemia -Continue statin  RA -Continue plaquenil   DVT prophylaxis: SCDs Code Status: full code Family Communication: patient only Disposition Plan: anticipate DC home in 24 hours  Consultants:   GI, Dr. Laural Golden  Procedures:   EGD  Capsule Endo  Antimicrobials:  Anti-infectives    Start     Dose/Rate Route Frequency Ordered Stop   09/21/16 1000  hydroxychloroquine (PLAQUENIL) tablet 200 mg    Comments:  Monday-Friday     200 mg Oral Once per day on Mon Tue Wed Thu Fri 09/20/16 1722         Subjective: Feels better, no complaints. denies N/V.  Objective: Vitals:   09/21/16 2038 09/21/16 2312 09/22/16 0542 09/22/16 1550  BP:  (!) 117/52 (!) 130/50 (!) 146/59  Pulse:  75 77 81  Resp:  16 16 16   Temp:  98.9 F (37.2 C) 98.7 F (37.1 C) 99.4 F (37.4 C)  TempSrc:  Oral Oral Oral  SpO2: 93% 98% 98% 97%  Weight:   69.9 kg (154 lb)     Height:        Intake/Output Summary (Last 24 hours) at 09/22/16 1612 Last data filed at 09/22/16 1500  Gross per 24 hour  Intake          1066.25 ml  Output                0 ml  Net          1066.25 ml   Filed Weights   09/20/16 1239 09/21/16 0500 09/22/16 0542  Weight: 65.3 kg (144 lb) 65.3 kg (143 lb 15.4 oz) 69.9 kg (154 lb)    Examination:  General exam: Alert, awake, oriented x 3 Respiratory system: Clear to auscultation. Respiratory effort normal. Cardiovascular system:RRR. No murmurs, rubs, gallops. Gastrointestinal system: Abdomen is nondistended, soft and nontender. No organomegaly or masses felt. Normal bowel sounds heard. Central nervous system: Alert and oriented. No focal neurological deficits. Extremities: No C/C/E, +pedal pulses Skin: No rashes, lesions or ulcers Psychiatry: Judgement and insight appear normal. Mood & affect appropriate.     Data Reviewed: I have personally reviewed following labs and imaging studies  CBC:  Recent Labs Lab 09/20/16 1250 09/21/16 0543 09/21/16 1647 09/22/16 0511  WBC 5.1 4.1  --  5.5  HGB 7.5* 7.4* 9.8* 9.0*  HCT 25.2* 23.7* 31.1* 28.9*  MCV 79.7 77.7*  --  80.1  PLT 288 280  --  240  Basic Metabolic Panel:  Recent Labs Lab 09/20/16 1250 09/21/16 0543 09/22/16 0511  NA 140 140 141  K 3.9 3.7 3.9  CL 106 106 111  CO2 24 24 23   GLUCOSE 112* 76 96  BUN 37* 26* 15  CREATININE 1.53* 1.05* 0.90  CALCIUM 8.9 8.4* 8.2*   GFR: Estimated Creatinine Clearance: 50.6 mL/min (by C-G formula based on SCr of 0.9 mg/dL). Liver Function Tests:  Recent Labs Lab 09/20/16 1250  AST 37  ALT 22  ALKPHOS 117  BILITOT 0.1*  PROT 7.7  ALBUMIN 2.9*   No results for input(s): LIPASE, AMYLASE in the last 168 hours. No results for input(s): AMMONIA in the last 168 hours. Coagulation Profile:  Recent Labs Lab 09/20/16 1812  INR 1.06   Cardiac Enzymes: No results for input(s): CKTOTAL, CKMB, CKMBINDEX, TROPONINI  in the last 168 hours. BNP (last 3 results) No results for input(s): PROBNP in the last 8760 hours. HbA1C: No results for input(s): HGBA1C in the last 72 hours. CBG: No results for input(s): GLUCAP in the last 168 hours. Lipid Profile: No results for input(s): CHOL, HDL, LDLCALC, TRIG, CHOLHDL, LDLDIRECT in the last 72 hours. Thyroid Function Tests: No results for input(s): TSH, T4TOTAL, FREET4, T3FREE, THYROIDAB in the last 72 hours. Anemia Panel: No results for input(s): VITAMINB12, FOLATE, FERRITIN, TIBC, IRON, RETICCTPCT in the last 72 hours. Urine analysis:    Component Value Date/Time   COLORURINE YELLOW 03/06/2016 1201   APPEARANCEUR CLEAR 03/06/2016 1201   LABSPEC 1.017 03/06/2016 1201   PHURINE 5.0 03/06/2016 1201   GLUCOSEU NEGATIVE 03/06/2016 1201   HGBUR NEGATIVE 03/06/2016 [place] 03/06/2016 1201   KETONESUR NEGATIVE 03/06/2016 1201   PROTEINUR NEGATIVE 03/06/2016 1201   UROBILINOGEN 0.2 07/17/2013 2240   NITRITE NEGATIVE 03/06/2016 1201   LEUKOCYTESUR 3+ (A) 03/06/2016 1201   Sepsis Labs: @LABRCNTIP (procalcitonin:4,lacticidven:4)  ) Recent Results (from the past 240 hour(s))  MRSA PCR Screening     Status: Abnormal   Collection Time: 09/20/16  5:23 PM  Result Value Ref Range Status   MRSA by PCR POSITIVE (A) NEGATIVE Final    Comment:        The GeneXpert MRSA Assay (FDA approved for NASAL specimens only), is one component of a comprehensive MRSA colonization surveillance program. It is not intended to diagnose MRSA infection nor to guide or monitor treatment for MRSA infections. RESULT CALLED TO, READ BACK BY AND VERIFIED WITH: LATNER,N ON 09/20/16 AT 2255 BY LOY,C          Radiology Studies: No results found.      Scheduled Meds: . allopurinol  100 mg Oral Daily  . carvedilol  3.125 mg Oral BID WC  . Chlorhexidine Gluconate Cloth  6 each Topical Q0600  . folic acid  1 mg Oral Daily  . hydroxychloroquine  200 mg  Oral Once per day on Mon Tue Wed Thu Fri  . mupirocin ointment  1 application Nasal BID  . [START ON 09/23/2016] pantoprazole  40 mg Oral QAC breakfast  . pravastatin  40 mg Oral q1800  . simethicone       Continuous Infusions: . sodium chloride    . 0.9 % NaCl with KCl 20 mEq / L 75 mL/hr at 09/22/16 1611     LOS: 0 days    Time spent: 25 minutes. Greater than 50% of this time was spent in direct contact with the patient coordinating care.     Lelon Frohlich, MD [facility] Pager [phone]  If 7PM-7AM, please contact night-coverage [email or web address] Password [email or web address] 09/22/2016, 4:12 PM

## 2016-09-23 LAB — BASIC METABOLIC PANEL
Anion gap: 9 (ref 5–15)
BUN: 13 mg/dL (ref 6–20)
CALCIUM: 8.2 mg/dL — AB (ref 8.9–10.3)
CO2: 19 mmol/L — ABNORMAL LOW (ref 22–32)
CREATININE: 0.74 mg/dL (ref 0.44–1.00)
Chloride: 113 mmol/L — ABNORMAL HIGH (ref 101–111)
GFR calc non Af Amer: >60 mL/min (ref >60)
Glucose, Bld: 93 mg/dL (ref 65–99)
Potassium: 4 mmol/L (ref 3.5–5.1)
SODIUM: 141 mmol/L (ref 135–145)

## 2016-09-23 LAB — CBC
HCT: 25.6 % — ABNORMAL LOW (ref 36.0–46.0)
Hemoglobin: 8 g/dL — ABNORMAL LOW (ref 12.0–15.0)
MCH: 25.1 pg — ABNORMAL LOW (ref 26.0–34.0)
MCHC: 31.3 g/dL (ref 30.0–36.0)
MCV: 80.3 fL (ref 78.0–100.0)
PLATELETS: 233 10*3/uL (ref 150–400)
RBC: 3.19 MIL/uL — AB (ref 3.87–5.11)
RDW: 19.7 % — AB (ref 11.5–15.5)
WBC: 5.5 10*3/uL (ref 4.0–10.5)

## 2016-09-23 LAB — GLUCOSE, CAPILLARY: GLUCOSE-CAPILLARY: 109 mg/dL — AB (ref 65–99)

## 2016-09-23 LAB — HEMOGLOBIN AND HEMATOCRIT, BLOOD
HCT: 26.1 % — ABNORMAL LOW (ref 36.0–46.0)
Hemoglobin: 8.1 g/dL — ABNORMAL LOW (ref 12.0–15.0)

## 2016-09-23 MED ORDER — NYSTATIN 100000 UNIT/GM EX POWD
Freq: Three times a day (TID) | CUTANEOUS | Status: DC
  Administered 2016-09-23 – 2016-09-25 (×6): via TOPICAL
  Filled 2016-09-23: qty 15

## 2016-09-23 NOTE — Progress Notes (Signed)
  Subjective:  Patient has no complaints. She denies nausea vomiting abdominal pain melena or rectal bleeding. She has been walking in the room without getting lightheaded or dizzy.  Objective: Blood pressure (!) 134/58, pulse 77, temperature 100 F (37.8 C), temperature source Oral, resp. rate 18, height 5\' 5"  (1.651 m), weight 154 lb (69.9 kg), SpO2 97 %. Patient is alert and in no acute distress. She has hearing impairment.  Labs/studies Results:   Recent Labs  09/21/16 0543 09/21/16 1647 09/22/16 0511 09/23/16 0603  WBC 4.1  --  5.5 5.5  HGB 7.4* 9.8* 9.0* 8.0*  HCT 23.7* 31.1* 28.9* 25.6*  PLT 280  --  240 233    BMET   Recent Labs  09/21/16 0543 09/22/16 0511 09/23/16 0603  NA 140 141 141  K 3.7 3.9 4.0  CL 106 111 113*  CO2 24 23 19*  GLUCOSE 76 96 93  BUN 26* 15 13  CREATININE 1.05* 0.90 0.74  CALCIUM 8.4* 8.2* 8.2*    LFT   Recent Labs  09/20/16 1250  PROT 7.7  ALBUMIN 2.9*  AST 37  ALT 22  ALKPHOS 117  BILITOT 0.1*    PT/INR   Recent Labs  09/20/16 1812  LABPROT 13.9  INR 1.06     Assessment:  #1. 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 Hemoglobin has dropped to 8 g. She has received one unit of PRBCs. #2. Acute on chronic anemia secondary to GI bleed.  Recommendations:  Patient will need small bowel endoscopy. Talked with Dr. Orion Modest of [facility]. His group does not do double balloon enteroscopy's anymore. The only do push enteroscopy. Will check with [facility]. H&H later today.

## 2016-09-23 NOTE — Progress Notes (Signed)
PROGRESS NOTE    [name]  ZJI:[id number] DOB: 24-May-1940 DOA: 09/20/2016 PCP: [name], MD     Brief Narrative:  76 y/o woman admitted from home on 5/31 due to melena and a low hemoglobin. Admission requested.   Assessment & Plan:   Active Problems:   Dyslipidemia   Biventricular ICD in place (MDT 2014)   Acute blood loss anemia   Ischemic cardiomyopathy   Chronic combined systolic and diastolic CHF (congestive heart failure) (HCC)   CKD (chronic kidney disease) stage 3, GFR 30-59 ml/min   Melena/Acute Blood Loss Anemia -Has been transfused 1 units of PRBCs. -Hb has dropped again to 8. -Underwent EGD on 6/1 with small duodenal AVM without stigmata of bleeding. -Capsule study with active bleeding from what appear to be small bowel AVMs. -Appreciate GI input and recommendations. GI is trying to arrange a transfer to [place] [facility].  Chronic Combined CHF -ECHO 3/17: EF 30-35% with grade 1 diastolic dysfunction and diffuse hypokinesis. -Compensated at present. -Is s/p AICD.  CKD Stage III -Cr is better than baseline. -Will restart lisinopril at low dose given depressed EF.  Hyperlipidemia -Continue statin  RA -Continue plaquenil   DVT prophylaxis: SCDs Code Status: full code Family Communication: patient only Disposition Plan: anticipate referral to tertiary center  Consultants:   GI, Dr. Laural Golden  Procedures:   EGD  Capsule Endo  Antimicrobials:  Anti-infectives    Start     Dose/Rate Route Frequency Ordered Stop   09/21/16 1000  hydroxychloroquine (PLAQUENIL) tablet 200 mg    Comments:  Monday-Friday     200 mg Oral Once per day on Mon Tue Wed Thu Fri 09/20/16 1722         Subjective: Feels better, denies n/v, no abdominal pain  Objective: Vitals:   09/22/16 1550 09/22/16 2056 09/23/16 0703 09/23/16 1620  BP: (!) 146/59 (!) 123/50 (!) 134/58 (!) 131/53  Pulse: 81 86 77 71  Resp: 16 16 18 16   Temp: 99.4 F (37.4 C) 98.9  F (37.2 C) 100 F (37.8 C)   TempSrc: Oral Oral Oral   SpO2: 97% 96% 97% 98%  Weight:      Height:        Intake/Output Summary (Last 24 hours) at 09/23/16 1711 Last data filed at 09/23/16 1500  Gross per 24 hour  Intake             2700 ml  Output                0 ml  Net             2700 ml   Filed Weights   09/20/16 1239 09/21/16 0500 09/22/16 0542  Weight: 65.3 kg (144 lb) 65.3 kg (143 lb 15.4 oz) 69.9 kg (154 lb)    Examination:  General exam: Alert, awake, oriented x 3 Respiratory system: Clear to auscultation. Respiratory effort normal. Cardiovascular system:RRR. No murmurs, rubs, gallops. Gastrointestinal system: Abdomen is nondistended, soft and nontender. No organomegaly or masses felt. Normal bowel sounds heard. Central nervous system: Alert and oriented. No focal neurological deficits. Extremities: No C/C/E, +pedal pulses Skin: No rashes, lesions or ulcers Psychiatry: Judgement and insight appear normal. Mood & affect appropriate.      Data Reviewed: I have personally reviewed following labs and imaging studies  CBC:  Recent Labs Lab 09/20/16 1250 09/21/16 0543 09/21/16 1647 09/22/16 0511 09/23/16 0603 09/23/16 1540  WBC 5.1 4.1  --  5.5 5.5  --  HGB 7.5* 7.4* 9.8* 9.0* 8.0* 8.1*  HCT 25.2* 23.7* 31.1* 28.9* 25.6* 26.1*  MCV 79.7 77.7*  --  80.1 80.3  --   PLT 288 280  --  240 233  --    Basic Metabolic Panel:  Recent Labs Lab 09/20/16 1250 09/21/16 0543 09/22/16 0511 09/23/16 0603  NA 140 140 141 141  K 3.9 3.7 3.9 4.0  CL 106 106 111 113*  CO2 24 24 23  19*  GLUCOSE 112* 76 96 93  BUN 37* 26* 15 13  CREATININE 1.53* 1.05* 0.90 0.74  CALCIUM 8.9 8.4* 8.2* 8.2*   GFR: Estimated Creatinine Clearance: 56.9 mL/min (by C-G formula based on SCr of 0.74 mg/dL). Liver Function Tests:  Recent Labs Lab 09/20/16 1250  AST 37  ALT 22  ALKPHOS 117  BILITOT 0.1*  PROT 7.7  ALBUMIN 2.9*   No results for input(s): LIPASE, AMYLASE in  the last 168 hours. No results for input(s): AMMONIA in the last 168 hours. Coagulation Profile:  Recent Labs Lab 09/20/16 1812  INR 1.06   Cardiac Enzymes: No results for input(s): CKTOTAL, CKMB, CKMBINDEX, TROPONINI in the last 168 hours. BNP (last 3 results) No results for input(s): PROBNP in the last 8760 hours. HbA1C: No results for input(s): HGBA1C in the last 72 hours. CBG:  Recent Labs Lab 09/23/16 1117  GLUCAP 109*   Lipid Profile: No results for input(s): CHOL, HDL, LDLCALC, TRIG, CHOLHDL, LDLDIRECT in the last 72 hours. Thyroid Function Tests: No results for input(s): TSH, T4TOTAL, FREET4, T3FREE, THYROIDAB in the last 72 hours. Anemia Panel: No results for input(s): VITAMINB12, FOLATE, FERRITIN, TIBC, IRON, RETICCTPCT in the last 72 hours. Urine analysis:    Component Value Date/Time   COLORURINE YELLOW 03/06/2016 [place] 03/06/2016 1201   LABSPEC 1.017 03/06/2016 1201   PHURINE 5.0 03/06/2016 1201   GLUCOSEU NEGATIVE 03/06/2016 1201   HGBUR NEGATIVE 03/06/2016 [place] 03/06/2016 1201   KETONESUR NEGATIVE 03/06/2016 1201   PROTEINUR NEGATIVE 03/06/2016 1201   UROBILINOGEN 0.2 07/17/2013 2240   NITRITE NEGATIVE 03/06/2016 1201   LEUKOCYTESUR 3+ (A) 03/06/2016 1201   Sepsis Labs: @LABRCNTIP (procalcitonin:4,lacticidven:4)  ) Recent Results (from the past 240 hour(s))  MRSA PCR Screening     Status: Abnormal   Collection Time: 09/20/16  5:23 PM  Result Value Ref Range Status   MRSA by PCR POSITIVE (A) NEGATIVE Final    Comment:        The GeneXpert MRSA Assay (FDA approved for NASAL specimens only), is one component of a comprehensive MRSA colonization surveillance program. It is not intended to diagnose MRSA infection nor to guide or monitor treatment for MRSA infections. RESULT CALLED TO, READ BACK BY AND VERIFIED WITH: LATNER,N ON 09/20/16 AT 2255 BY LOY,C          Radiology Studies: No results  found.      Scheduled Meds: . allopurinol  100 mg Oral Daily  . carvedilol  3.125 mg Oral BID WC  . Chlorhexidine Gluconate Cloth  6 each Topical Q0600  . folic acid  1 mg Oral Daily  . hydroxychloroquine  200 mg Oral Once per day on Mon Tue Wed Thu Fri  . lisinopril  5 mg Oral Daily  . mupirocin ointment  1 application Nasal BID  . pantoprazole  40 mg Oral QAC breakfast  . pravastatin  40 mg Oral q1800   Continuous Infusions:    LOS: 1 day  Time spent: 25 minutes. Greater than 50% of this time was spent in direct contact with the patient coordinating care.     Lelon Frohlich, MD [facility] Pager [phone]  If 7PM-7AM, please contact night-coverage [email or web address] Password [email or web address] 09/23/2016, 5:11 PM

## 2016-09-23 NOTE — Op Note (Signed)
Small Bowel [place] Capsule Study Procedure date: 09/22/2016  Referring Provider:  Rande Lawman, MD PCP:  Dr. [name], MD  Indication for procedure:   Patient is 76 year old African-American female who presents with melena and anemia. She underwent EGD revealing small nonbleeding duodenal AV malformation. She is therefore undergoing small bowel given capsule study. She was also evaluated with EGD and colonoscopy in November 2016 for GI bleed and anemia and no etiology noted.    Findings:   Patient was able to swallow Given capsule without any difficulty. Study duration 8 hours 40 minutes and 30 seconds  Small nonbleeding duodenal AV malformation noted at 16 minutes and 19 seconds(also seen at EGD). Pool of fresh blood noted at 49:06,  49:11, 5606,  02:01:11 and 02:02:27. AV malformations also noted 00:50:36 00:51:15 and 00:59:13. Coffee-ground material noted distal to these lesions.  First Gastric image:  1 min and 39 sec First Duodenal image: 16 min and 17 sec First Ileo-Cecal Valve image: 5 hrs 45 min and 55 sec First Cecal image: 5 hrs 45 min and 57 sec Gastric Passage time: 14 min Small Bowel Passage time:  5 hrs and 30 min  Summary & Recommendations:  Study is complete as Given capsule reached cecum. Active bleeding noted from at least 5 sites in jejunum secondary to AV malformations. Patient will need small bowel endoscopy. Will arrange for referral to tertiary center.

## 2016-09-24 LAB — CBC
HCT: 24.2 % — ABNORMAL LOW (ref 36.0–46.0)
HEMOGLOBIN: 7.6 g/dL — AB (ref 12.0–15.0)
MCH: 25.2 pg — AB (ref 26.0–34.0)
MCHC: 31.4 g/dL (ref 30.0–36.0)
MCV: 80.1 fL (ref 78.0–100.0)
PLATELETS: 216 10*3/uL (ref 150–400)
RBC: 3.02 MIL/uL — AB (ref 3.87–5.11)
RDW: 19.9 % — ABNORMAL HIGH (ref 11.5–15.5)
WBC: 5.6 10*3/uL (ref 4.0–10.5)

## 2016-09-24 MED ORDER — GUAIFENESIN-DM 100-10 MG/5ML PO SYRP
5.0000 mL | ORAL_SOLUTION | ORAL | Status: DC | PRN
  Administered 2016-09-24 – 2016-09-25 (×2): 5 mL via ORAL
  Filled 2016-09-24 (×2): qty 5

## 2016-09-24 MED ORDER — PANTOPRAZOLE SODIUM 40 MG PO TBEC: 40.0000 mg | DELAYED_RELEASE_TABLET | Freq: Every day | ORAL | Status: DC

## 2016-09-24 NOTE — Progress Notes (Signed)
PROGRESS NOTE    [name]  TML:[id number] DOB: 1940/07/31 DOA: 09/20/2016 PCP: [name], MD     Brief Narrative:  76 y/o woman admitted from home on 5/31 due to melena and a low hemoglobin. Admission requested.   Assessment & Plan:   Active Problems:   Dyslipidemia   Biventricular ICD in place (MDT 2014)   Acute blood loss anemia   Ischemic cardiomyopathy   Chronic combined systolic and diastolic CHF (congestive heart failure) (HCC)   CKD (chronic kidney disease) stage 3, GFR 30-59 ml/min   Melena/Acute Blood Loss Anemia -Has been transfused 1 units of PRBCs. -Hb has dropped again to 7.6. Monitor closely as will likely need transfusion again in the next 24-48 hours. -Underwent EGD on 6/1 with small duodenal AVM without stigmata of bleeding. -Capsule study with active bleeding from what appear to be small bowel AVMs. -Appreciate GI input and recommendations. GI is trying to arrange a transfer to [facility].  Chronic Combined CHF -ECHO 3/17: EF 30-35% with grade 1 diastolic dysfunction and diffuse hypokinesis. -Compensated at present. -Is s/p AICD.  CKD Stage III -Cr is better than baseline. -Lisinopril has been restarted.  Hyperlipidemia -Continue statin  RA -Continue plaquenil   DVT prophylaxis: SCDs Code Status: full code Family Communication: patient only Disposition Plan: anticipate referral to tertiary center  Consultants:   GI, Dr. Laural Golden  Procedures:   EGD  Capsule Endo  Antimicrobials:  Anti-infectives    Start     Dose/Rate Route Frequency Ordered Stop   09/21/16 1000  hydroxychloroquine (PLAQUENIL) tablet 200 mg    Comments:  Monday-Friday     200 mg Oral Once per day on Mon Tue Wed Thu Fri 09/20/16 1722         Subjective: Has no complaints. Multiple family members at bedside.  Objective: Vitals:   09/23/16 2224 09/24/16 0623 09/24/16 0743 09/24/16 1032  BP: (!) 145/66 131/66  (!) 188/160  Pulse: 85 92      Resp: 16 16    Temp: 99.8 F (37.7 C) 100 F (37.8 C)    TempSrc: Oral Oral    SpO2: 100% 94%    Weight:   67.5 kg (148 lb 14.4 oz)   Height:        Intake/Output Summary (Last 24 hours) at 09/24/16 1050 Last data filed at 09/23/16 1500  Gross per 24 hour  Intake              705 ml  Output                0 ml  Net              705 ml   Filed Weights   09/21/16 0500 09/22/16 0542 09/24/16 0743  Weight: 65.3 kg (143 lb 15.4 oz) 69.9 kg (154 lb) 67.5 kg (148 lb 14.4 oz)    Examination:  General exam: Alert, awake, oriented x 3 Respiratory system: Clear to auscultation. Respiratory effort normal. Cardiovascular system:RRR. No murmurs, rubs, gallops. Gastrointestinal system: Abdomen is nondistended, soft and nontender. No organomegaly or masses felt. Normal bowel sounds heard. Central nervous system: Alert and oriented. No focal neurological deficits. Extremities: No C/C/E, +pedal pulses Skin: No rashes, lesions or ulcers Psychiatry: Judgement and insight appear normal. Mood & affect appropriate.       Data Reviewed: I have personally reviewed following labs and imaging studies  CBC:  Recent Labs Lab 09/20/16 1250 09/21/16 0543 09/21/16 1647 09/22/16 6812  09/23/16 0603 09/23/16 1540 09/24/16 0515  WBC 5.1 4.1  --  5.5 5.5  --  5.6  HGB 7.5* 7.4* 9.8* 9.0* 8.0* 8.1* 7.6*  HCT 25.2* 23.7* 31.1* 28.9* 25.6* 26.1* 24.2*  MCV 79.7 77.7*  --  80.1 80.3  --  80.1  PLT 288 280  --  240 233  --  633   Basic Metabolic Panel:  Recent Labs Lab 09/20/16 1250 09/21/16 0543 09/22/16 0511 09/23/16 0603  NA 140 140 141 141  K 3.9 3.7 3.9 4.0  CL 106 106 111 113*  CO2 24 24 23  19*  GLUCOSE 112* 76 96 93  BUN 37* 26* 15 13  CREATININE 1.53* 1.05* 0.90 0.74  CALCIUM 8.9 8.4* 8.2* 8.2*   GFR: Estimated Creatinine Clearance: 52.2 mL/min (by C-G formula based on SCr of 0.74 mg/dL). Liver Function Tests:  Recent Labs Lab 09/20/16 1250  AST 37  ALT 22  ALKPHOS  117  BILITOT 0.1*  PROT 7.7  ALBUMIN 2.9*   No results for input(s): LIPASE, AMYLASE in the last 168 hours. No results for input(s): AMMONIA in the last 168 hours. Coagulation Profile:  Recent Labs Lab 09/20/16 1812  INR 1.06   Cardiac Enzymes: No results for input(s): CKTOTAL, CKMB, CKMBINDEX, TROPONINI in the last 168 hours. BNP (last 3 results) No results for input(s): PROBNP in the last 8760 hours. HbA1C: No results for input(s): HGBA1C in the last 72 hours. CBG:  Recent Labs Lab 09/23/16 1117  GLUCAP 109*   Lipid Profile: No results for input(s): CHOL, HDL, LDLCALC, TRIG, CHOLHDL, LDLDIRECT in the last 72 hours. Thyroid Function Tests: No results for input(s): TSH, T4TOTAL, FREET4, T3FREE, THYROIDAB in the last 72 hours. Anemia Panel: No results for input(s): VITAMINB12, FOLATE, FERRITIN, TIBC, IRON, RETICCTPCT in the last 72 hours. Urine analysis:    Component Value Date/Time   COLORURINE YELLOW 03/06/2016 1201   APPEARANCEUR CLEAR 03/06/2016 1201   LABSPEC 1.017 03/06/2016 1201   PHURINE 5.0 03/06/2016 1201   GLUCOSEU NEGATIVE 03/06/2016 1201   HGBUR NEGATIVE 03/06/2016 [place] 03/06/2016 1201   KETONESUR NEGATIVE 03/06/2016 1201   PROTEINUR NEGATIVE 03/06/2016 1201   UROBILINOGEN 0.2 07/17/2013 2240   NITRITE NEGATIVE 03/06/2016 1201   LEUKOCYTESUR 3+ (A) 03/06/2016 1201   Sepsis Labs: @LABRCNTIP (procalcitonin:4,lacticidven:4)  ) Recent Results (from the past 240 hour(s))  MRSA PCR Screening     Status: Abnormal   Collection Time: 09/20/16  5:23 PM  Result Value Ref Range Status   MRSA by PCR POSITIVE (A) NEGATIVE Final    Comment:        The GeneXpert MRSA Assay (FDA approved for NASAL specimens only), is one component of a comprehensive MRSA colonization surveillance program. It is not intended to diagnose MRSA infection nor to guide or monitor treatment for MRSA infections. RESULT CALLED TO, READ BACK BY AND VERIFIED  WITH: LATNER,N ON 09/20/16 AT 2255 BY LOY,C          Radiology Studies: No results found.      Scheduled Meds: . allopurinol  100 mg Oral Daily  . carvedilol  3.125 mg Oral BID WC  . Chlorhexidine Gluconate Cloth  6 each Topical Q0600  . folic acid  1 mg Oral Daily  . hydroxychloroquine  200 mg Oral Once per day on Mon Tue Wed Thu Fri  . lisinopril  5 mg Oral Daily  . mupirocin ointment  1 application Nasal BID  . nystatin   Topical  TID  . pantoprazole  40 mg Oral QAC breakfast  . pravastatin  40 mg Oral q1800   Continuous Infusions:    LOS: 2 days    Time spent: 25 minutes. Greater than 50% of this time was spent in direct contact with the patient coordinating care.     Lelon Frohlich, MD [facility] Pager [phone]  If 7PM-7AM, please contact night-coverage [email or web address] Password [email or web address] 09/24/2016, 10:50 AM

## 2016-09-24 NOTE — Progress Notes (Signed)
Patient ID: [name], female   DOB: 1941/03/04, 76 y.o.   MRN: [id number] Alert. States she had a gray stool this am. Passed camera this morning. Has been transfused with one unit. Ambulating well in room.  Blood pressure 131/66, pulse 92, temperature 100 F (37.8 C), temperature source Oral, resp. rate 16, height 5\' 5"  (1.651 m), weight 148 lb 14.4 oz (67.5 kg), SpO2 94 %. CBC    Component Value Date/Time   WBC 5.6 09/24/2016 0515   RBC 3.02 (L) 09/24/2016 0515   HGB 7.6 (L) 09/24/2016 0515   HCT 24.2 (L) 09/24/2016 0515   PLT 216 09/24/2016 0515   MCV 80.1 09/24/2016 0515   MCH 25.2 (L) 09/24/2016 0515   MCHC 31.4 09/24/2016 0515   RDW 19.9 (H) 09/24/2016 0515   LYMPHSABS 1.2 09/05/2016 1351   MONOABS 0.9 09/05/2016 1351   EOSABS 0.2 09/05/2016 1351   BASOSABS 0.1 09/05/2016 1351   Upper GI bleed. Underwent Given's capsule yesterday. Has passed camera.  Consider transfusion with one unit.  I discussed with her RN today Lattie Haw).

## 2016-09-24 NOTE — Progress Notes (Signed)
Informed by Network engineer that [facility] does not have a bed for patient at this time. Pt could possibly have a bed tomorrow. I called and left a voicemail for Dr. Laural Golden. Also, Dr. Jerilee Hoh E-Paged to notify of patient's current status. Pt has capsule study disc at bedside which is to accompany her to [facility]. No further instructions at this time.

## 2016-09-24 NOTE — Progress Notes (Signed)
Talked with Dr. Chales Abrahams [facility]. He has kindly agreed to accept patient in transfer for single balloon small bowel endoscopy. Will send given capsule study with the patient.

## 2016-09-25 ENCOUNTER — Encounter (HOSPITAL_COMMUNITY): Payer: Self-pay | Admitting: Internal Medicine

## 2016-09-25 LAB — CBC
HEMATOCRIT: 26.3 % — AB (ref 36.0–46.0)
Hemoglobin: 8.3 g/dL — ABNORMAL LOW (ref 12.0–15.0)
MCH: 25.3 pg — AB (ref 26.0–34.0)
MCHC: 31.6 g/dL (ref 30.0–36.0)
MCV: 80.2 fL (ref 78.0–100.0)
Platelets: 222 10*3/uL (ref 150–400)
RBC: 3.28 MIL/uL — ABNORMAL LOW (ref 3.87–5.11)
RDW: 19.7 % — ABNORMAL HIGH (ref 11.5–15.5)
WBC: 7.2 10*3/uL (ref 4.0–10.5)

## 2016-09-25 MED ORDER — PHENOL 1.4 % MT LIQD
1.0000 | OROMUCOSAL | Status: DC | PRN
  Administered 2016-09-25: 1 via OROMUCOSAL
  Filled 2016-09-25: qty 177

## 2016-09-25 MED ORDER — PANTOPRAZOLE SODIUM 40 MG PO TBEC: 40.0000 mg | DELAYED_RELEASE_TABLET | Freq: Every day | ORAL | 2 refills | Status: DC

## 2016-09-25 NOTE — Discharge Summary (Signed)
Physician Discharge Summary  [name] NGE:[id number] DOB: 1940/11/26 DOA: 09/20/2016  PCP: [name], MD  Admit date: 09/20/2016 Discharge date: 09/25/2016  Time spent: 45 minutes  Recommendations for Outpatient Follow-up:  -Will be discharged home today. -OP appointment to [facility] will be arranged by Dr. Laural Golden for single balloon small bowerl endoscopy.  Discharge Diagnoses:  Active Problems:   Dyslipidemia   Biventricular ICD in place (MDT 2014)   Acute blood loss anemia   Ischemic cardiomyopathy   Chronic combined systolic and diastolic CHF (congestive heart failure) (HCC)   CKD (chronic kidney disease) stage 3, GFR 30-59 ml/min   Discharge Condition: Stable and improved  Filed Weights   09/22/16 0542 09/24/16 0743 09/25/16 0500  Weight: 69.9 kg (154 lb) 67.5 kg (148 lb 14.4 oz) 66.9 kg (147 lb 7.8 oz)    History of present illness:  As per Dr. Carles Collet on 5/31: [name] is a 76 y.o. female with medical history of coronary artery disease, anemia chronic disease, hypertension, hyperlipidemia, gouty arthritis presented with 1 and 1/2 week history of dark stools.  The patient states that she has had some intermittent epigastric discomfort brought on by eating. Unfortunately, she is a poor historian and unable to provide any other significant history. Remainder of the history is obtained from speaking with EDP and review of the medical record. The patient denies any nausea, vomiting, diarrhea, hematemesis. She denies any chest pain, shortness breath, dizziness, headache, hematochezia, dysuria, hematuria. There is no history of NSAID use except for aspirin.  In the emergency department, the patient was afebrile and hemodynamically stable saturating 100% on room air. BMP and LFTs were unremarkable. Hemoglobin was 7.5 with baseline hemoglobin approximately 8. Serum creatinine was 1.53. Fecal occult blood test was positive with dark stool without bright red blood.  Hospital  Course:   Melena/Acute Blood Loss Anemia -Has been transfused 1 units of PRBCs. -Hb has remained stable at around 8 since transfusion. -Underwent EGD on 6/1 with small duodenal AVM without stigmata of bleeding. -Capsule study with active bleeding from what appear to be small bowel AVMs. -Continue PPI. -Dr. Laural Golden made arrangements for patient to be transferred to the [facility] for the purpose of small bowel endoscopy, however no beds have been available; since patient is stable decision has been made to discharge her home and an OP [facility] appointment will be arranged.  Chronic Combined CHF -ECHO 3/17: EF 30-35% with grade 1 diastolic dysfunction and diffuse hypokinesis. -Compensated at present. -Is s/p AICD.  CKD Stage III -Cr is better than baseline. -Lisinopril has been restarted.  Hyperlipidemia -Continue statin  RA -Continue plaquenil  Procedures: EGD: Impression:               - Normal esophagus.                           - Z-line regular, 38 cm from the incisors.                           - Normal stomach.                           - Normal duodenal bulb.                           - A single non-bleeding angiodysplastic lesion  in                            the duodenum.                            - No specimens collected.  Capsule Endoscopy: Active bleeding noted from at least 5 sites in jejunum secondary to AV malformations.  Consultations:  GI, dr. Laural Golden  Discharge Instructions  Discharge Instructions    Diet - low sodium heart healthy    Complete by:  As directed    Diet - low sodium heart healthy    Complete by:  As directed    Increase activity slowly    Complete by:  As directed    Increase activity slowly    Complete by:  As directed      Allergies as of 09/25/2016   No Known Allergies     Medication List    STOP taking these medications   aspirin EC 81 MG tablet   colchicine 0.6 MG tablet   furosemide 40 MG tablet Commonly known as:  LASIX     KLOR-CON M10 10 MEQ tablet Generic drug:  potassium chloride   omeprazole 20 MG capsule Commonly known as:  PRILOSEC     TAKE these medications   acetaminophen 500 MG tablet Commonly known as:  TYLENOL Take 1,000 mg by mouth every 6 (six) hours as needed.   allopurinol 100 MG tablet Commonly known as:  ZYLOPRIM Take 1 tablet (100 mg total) by mouth daily.   carvedilol 6.25 MG tablet Commonly known as:  COREG TAKE 1 TABLET BY MOUTH TWICE A DAY WITH MEALS   folic acid 1 MG tablet Commonly known as:  FOLVITE Take 1 mg by mouth daily.   hydroxychloroquine 200 MG tablet Commonly known as:  PLAQUENIL Take 1 tablet (200 mg total) by mouth daily. Monday-Friday   lisinopril 20 MG tablet Commonly known as:  PRINIVIL,ZESTRIL Take 10 mg by mouth daily.   lovastatin 40 MG tablet Commonly known as:  MEVACOR TAKE 1 TABLET (40 MG TOTAL) BY MOUTH AT BEDTIME.   pantoprazole 40 MG tablet Commonly known as:  PROTONIX Take 1 tablet (40 mg total) by mouth daily before breakfast.      No Known Allergies Follow-up Information    Rehman, Mechele Dawley, MD Follow up.   Specialty:  Gastroenterology Why:  his office will call you with date and time of [facility] appointment Contact information: [employer], [address] [place] [address] [phone]            The results of significant diagnostics from this hospitalization (including imaging, microbiology, ancillary and laboratory) are listed below for reference.    Significant Diagnostic Studies: No results found.  Microbiology: Recent Results (from the past 240 hour(s))  MRSA PCR Screening     Status: Abnormal   Collection Time: 09/20/16  5:23 PM  Result Value Ref Range Status   MRSA by PCR POSITIVE (A) NEGATIVE Final    Comment:        The GeneXpert MRSA Assay (FDA approved for NASAL specimens only), is one component of a comprehensive MRSA colonization surveillance program. It is not intended to diagnose MRSA infection  nor to guide or monitor treatment for MRSA infections. RESULT CALLED TO, READ BACK BY AND VERIFIED WITH: LATNER,N ON 09/20/16 AT 2255 BY LOY,C      Labs: Basic  Metabolic Panel:  Recent Labs Lab 09/20/16 1250 09/21/16 0543 09/22/16 0511 09/23/16 0603  NA 140 140 141 141  K 3.9 3.7 3.9 4.0  CL 106 106 111 113*  CO2 24 24 23  19*  GLUCOSE 112* 76 96 93  BUN 37* 26* 15 13  CREATININE 1.53* 1.05* 0.90 0.74  CALCIUM 8.9 8.4* 8.2* 8.2*   Liver Function Tests:  Recent Labs Lab 09/20/16 1250  AST 37  ALT 22  ALKPHOS 117  BILITOT 0.1*  PROT 7.7  ALBUMIN 2.9*   No results for input(s): LIPASE, AMYLASE in the last 168 hours. No results for input(s): AMMONIA in the last 168 hours. CBC:  Recent Labs Lab 09/21/16 0543  09/22/16 0511 09/23/16 0603 09/23/16 1540 09/24/16 0515 09/25/16 0523  WBC 4.1  --  5.5 5.5  --  5.6 7.2  HGB 7.4*  < > 9.0* 8.0* 8.1* 7.6* 8.3*  HCT 23.7*  < > 28.9* 25.6* 26.1* 24.2* 26.3*  MCV 77.7*  --  80.1 80.3  --  80.1 80.2  PLT 280  --  240 233  --  216 222  < > = values in this interval not displayed. Cardiac Enzymes: No results for input(s): CKTOTAL, CKMB, CKMBINDEX, TROPONINI in the last 168 hours. BNP: BNP (last 3 results) No results for input(s): BNP in the last 8760 hours.  ProBNP (last 3 results) No results for input(s): PROBNP in the last 8760 hours.  CBG:  Recent Labs Lab 09/23/16 1117  GLUCAP 109*       Signed:  HERNANDEZ ACOSTA,Mat Stuard  [facility] Pager: [phone] 09/25/2016, 5:10 PM

## 2016-09-25 NOTE — Progress Notes (Signed)
Pt's IV catheter removed and intact. Pt's IV site clean dry and intact. Discharge instructions including medications and follow up appointments were reviewed and discussed with patient. Pt verbalized understanding of discharge instructions. All questions were answered and no further questions at this time. Pt in stable condition and in no acute distress at time of discharge. Pt will be escorted by nurse tech.  

## 2016-09-25 NOTE — Care Management Note (Signed)
Case Management Note  Patient Details  Name: [name] MRN: [id number] Date of Birth: 08-27-1940  If discussed at [place] Length of Stay Meetings, dates discussed:  09/25/2016 Additional Comments: Patient discharging today, either home or [facility] if bed is available. Hemoglobin stable.  Adan Beal, Chauncey Reading, RN 09/25/2016, 10:18 AM

## 2016-09-26 ENCOUNTER — Telehealth (INDEPENDENT_AMBULATORY_CARE_PROVIDER_SITE_OTHER): Payer: Self-pay | Admitting: Internal Medicine

## 2016-09-26 NOTE — Telephone Encounter (Signed)
Spoke to [employer] at [facility] to let her know that Dr. Laural Golden wanted [place] to let Dr. Larey Days know that this patient would not be transferred because there were no beds available.  She has been discharged.  They were able to stop the bleed.  [facility] will be calling the patient to schedule a small bowel endoscopy.  The contact person will be her nephew, [name] [phone].  [phone]

## 2016-09-27 ENCOUNTER — Encounter (HOSPITAL_COMMUNITY): Payer: Self-pay | Admitting: Internal Medicine

## 2016-09-27 NOTE — Progress Notes (Signed)
No ICM remote transmission received for 09/24/2016 and next ICM transmission scheduled for 10/05/2016.

## 2016-09-28 ENCOUNTER — Ambulatory Visit (INDEPENDENT_AMBULATORY_CARE_PROVIDER_SITE_OTHER): Payer: Medicare HMO | Admitting: Internal Medicine

## 2016-10-04 ENCOUNTER — Other Ambulatory Visit (INDEPENDENT_AMBULATORY_CARE_PROVIDER_SITE_OTHER): Payer: Self-pay | Admitting: *Deleted

## 2016-10-04 ENCOUNTER — Telehealth (INDEPENDENT_AMBULATORY_CARE_PROVIDER_SITE_OTHER): Payer: Self-pay | Admitting: Internal Medicine

## 2016-10-04 DIAGNOSIS — D638 Anemia in other chronic diseases classified elsewhere: Secondary | ICD-10-CM

## 2016-10-04 NOTE — Telephone Encounter (Signed)
Patient's niece Linton Rump called, stated they still haven't heard anything from [facility].  She stated that her legs are swollen up to her waist.  She wants to know if the patient can take her fluid pill, if not, she'll end up back in the hospital.  [phone] - Linton Rump

## 2016-10-04 NOTE — Telephone Encounter (Signed)
Per Dr.Rehman - patient may take her fluid pill today. We will get H&H. Patient should call PCP tomorrow to discuss this. Helene Kelp was called and a message was left on her voicemail.

## 2016-10-05 ENCOUNTER — Other Ambulatory Visit (HOSPITAL_COMMUNITY)
Admission: RE | Admit: 2016-10-05 | Discharge: 2016-10-05 | Disposition: A | Discharge status: Home / Self Care | Payer: Medicare HMO | Source: Ambulatory Visit | Attending: Internal Medicine | Admitting: Internal Medicine

## 2016-10-05 ENCOUNTER — Ambulatory Visit (INDEPENDENT_AMBULATORY_CARE_PROVIDER_SITE_OTHER): Payer: Medicare HMO

## 2016-10-05 ENCOUNTER — Telehealth: Payer: Self-pay

## 2016-10-05 DIAGNOSIS — I5022 Chronic systolic (congestive) heart failure: Secondary | ICD-10-CM

## 2016-10-05 DIAGNOSIS — D638 Anemia in other chronic diseases classified elsewhere: Secondary | ICD-10-CM | POA: Diagnosis not present

## 2016-10-05 DIAGNOSIS — Z95810 Presence of automatic (implantable) cardiac defibrillator: Secondary | ICD-10-CM | POA: Diagnosis not present

## 2016-10-05 LAB — HEMOGLOBIN AND HEMATOCRIT, BLOOD
HEMATOCRIT: 29.4 % — AB (ref 36.0–46.0)
HEMOGLOBIN: 9.2 g/dL — AB (ref 12.0–15.0)

## 2016-10-05 MED ORDER — POTASSIUM CHLORIDE ER 10 MEQ PO TBCR: 10.0000 meq | EXTENDED_RELEASE_TABLET | Freq: Every day | ORAL | 3 refills | Status: DC

## 2016-10-05 MED ORDER — FUROSEMIDE 40 MG PO TABS: 40.0000 mg | ORAL_TABLET | Freq: Every day | ORAL | 3 refills | Status: DC

## 2016-10-05 NOTE — Telephone Encounter (Signed)
Remote ICM transmission received.  Attempted call to nephew, [name] and left message to return call.

## 2016-10-05 NOTE — Progress Notes (Signed)
Go back to 40 mg bid x 3 days, then 40 mg daily. Check BMET on 10/08/16. Schedule a clinic visit with APP.

## 2016-10-05 NOTE — Progress Notes (Signed)
Attempted call to nephew, [name] and no answer.

## 2016-10-05 NOTE — Progress Notes (Signed)
EPIC Encounter for ICM Monitoring  Patient Name: [name] is a 76 y.o. female Date: 10/05/2016 Primary Care Physican: [name], MD Primary [place] Electrophysiologist: Lovena Le Dry Weight:unknown Bi-V Pacing: 94.7%        Spoke with nephew, [name].  He stated patient has swelling in legs, upper and lower, she is coughing and patient is saying she has a lot of fluid.   He reported the Furosemide and Potassium were stopped at time of discharge from the hospital on 6/5.   Thoracic impedance abnormal suggesting fluid accumulation since 09/19/2016 which correlates with start of hospitalization for GI bleed.  Discharge note on 09/25/2016 instructs patient to stop taking fluid pill but epic note on 6/14 states she may take her fluid pill on that day.   Epic note states patients legs are swollen up to the waist.   Prescribed dosage: Both Furosemide and Klor Con were stopped at time of hospital discharge on 09/25/2016.  Patient was taking Furosemide 40 mg 1 tablet by mouth twice a day. May take extra 40 mg for weight gain of 3 lbs in 24 hrs. Klor Con 10 mEq 1 tablet twice a day.   Labs: 09/23/2016 Creatinine 0.74, BUN 13, Potassium 4.0, Sodium 141, EGFR >60 09/22/2016 Creatinine 0.90, BUN 15, Potassium 3.9, Sodium 141, EGFR >60  09/21/2016 Creatinine 1.05, BUN 26, Potassium 3.7, Sodium 140, EGFR 50-57  09/20/2016 Creatinine 1.53, BUN 37, Potassium 3.9, Sodium 140, EGFR 31-36 09/05/2016 Creatinine 1.25, BUN 29, Potassium 4.0, Sodium 137, EGFR 40-47  08/08/2016 Creatinine 1.10, BUN 24, Potassium 3.6, Sodium 136, EGFR 47-55 07/19/2016 Creatinine 1.26, BUN 29, Potassium 3.6, Sodium 139, EGFR 40-46 06/22/2016 Creatinine 1.44, BUN 37, Potassium 3.7, Sodium 140, EGFR 34-39 06/01/2016 Creatinine 1.68, BUN 43, Potassium 4.7, Sodium 132, EGFR 28-33 01/31/2016 Creatinine 1.31, BUN 34, Potassium 4.5, Sodium 137 GFR 38-44 01/11/2016 Creatinine 1.48, BUN 38, Potassium 4.1, Sodium  135 GFR 33-38 12/20/2015 Creatinine 1.77, BUN 40, Potassium 4.0, Sodium 140, GFR 27-31 12/02/2015 Creatinine 1.38, BUN 28, Potassium 4.0, Sodium 136, GFR 36-42 11/29/2015 Creatinine 1.68, BUN 31, Potassium 4.1, Sodium 137, GFR 28-33 11/02/2015 Creatinine 1.73, BUN 41, Potassium 4.5, Sodium 137, GFR 27-32 10/21/2015 Creatinine 2.01, BUN 35, Potassium 5.0, Sodium 136, GFR 23-26 10/12/2015 Creatinine 1.61, BUN 30, Potassium 4.0, Sodium 138, GFR 30-34 10/06/2015 Creatinine 1.45, BUN 33, Potassium 3.7, Sodium 138, GFR 34-39 09/30/2015 Creatinine 1.55, BUN 37, Potassium 4.2, Sodium 139, GFR 31-36 09/21/2015 Creatinine 1.72, BUN 49, Potassium 3.7, Sodium 138, GFR 27-32  Recommendations: Advised would send ICM check to Dr [name] for review and recommendations and call him back.   Follow-up plan: ICM clinic phone appointment on 10/09/2016 to recheck fluid levels.    3 month ICM trend: 10/05/2016   1 Year ICM trend:      [name], RN 10/05/2016 7:27 AM

## 2016-10-05 NOTE — Progress Notes (Signed)
Addendum to Dr Court Joy orders:  Received: Today  Message Contents  Herminio Commons, MD  Bernita Raisin, RN; Arayla Kruschke Panda, RN          Lasix 40 mg bid x 3 days, then resume 40 mg daily. Then fu appt with APP next week. Resume 10 meq KCl daily. Check BMET on 6/18.

## 2016-10-05 NOTE — Progress Notes (Signed)
Call to nephew [name].  Advised of Dr Court Joy orders to restart Furosemide 40 mg twice a day x 3 days and after 3rd day then take 40 mg once a day.  Start Potassium 10 mEq 1 tablet daily.  She will need BMET drawn on Monday, 10/08/2016.  She has an appointment set up at [facility] office to see Jory Sims on 10/15/2016 at 1:30 and he needs to change that for any reason to call the [place] office.  Provided [place] office number.    He stated patient had labs drawn today that were ordered by GI MD at [facility] lab.  Advised the lab she had drawn today is different then Dr Court Joy order.   Requested he get the name and location of the lab in [place] that is covered by patient's insurance and leave a voice mail message with the information.  I will fax orders Monday morning.   Nephew verbalized understanding and repeated all the orders back correctly.  Advised to be sure that he and patient's niece are following the same orders and explained patient's GI MD is not in our Epic system so unsure of what orders she has been given by that office.  He stated he understood.  Will repeat ICM remote transmission on 10/09/2016.  He asked prescriptions be sent to [facility] mail order but she has plenty of supply on hand at this time.

## 2016-10-08 ENCOUNTER — Telehealth (INDEPENDENT_AMBULATORY_CARE_PROVIDER_SITE_OTHER): Payer: Self-pay | Admitting: *Deleted

## 2016-10-08 ENCOUNTER — Telehealth: Payer: Self-pay

## 2016-10-08 ENCOUNTER — Other Ambulatory Visit (HOSPITAL_COMMUNITY)
Admission: RE | Admit: 2016-10-08 | Discharge: 2016-10-08 | Disposition: A | Discharge status: Home / Self Care | Payer: Medicare HMO | Source: Ambulatory Visit | Attending: Cardiovascular Disease | Admitting: Cardiovascular Disease

## 2016-10-08 DIAGNOSIS — I5022 Chronic systolic (congestive) heart failure: Secondary | ICD-10-CM | POA: Insufficient documentation

## 2016-10-08 DIAGNOSIS — Z95810 Presence of automatic (implantable) cardiac defibrillator: Secondary | ICD-10-CM | POA: Diagnosis not present

## 2016-10-08 LAB — BASIC METABOLIC PANEL
Anion gap: 11 (ref 5–15)
BUN: 16 mg/dL (ref 6–20)
CHLORIDE: 103 mmol/L (ref 101–111)
CO2: 26 mmol/L (ref 22–32)
Calcium: 8.6 mg/dL — ABNORMAL LOW (ref 8.9–10.3)
Creatinine, Ser: 0.95 mg/dL (ref 0.44–1.00)
GFR calc non Af Amer: 56 mL/min — ABNORMAL LOW (ref >60)
Glucose, Bld: 113 mg/dL — ABNORMAL HIGH (ref 65–99)
POTASSIUM: 3.1 mmol/L — AB (ref 3.5–5.1)
SODIUM: 140 mmol/L (ref 135–145)

## 2016-10-08 NOTE — Telephone Encounter (Signed)
Dr.Rehman had left a message for the pt had not heard about appointment yet and wanted [place] to call [facility].  This morning I got a telephone message from her niece , Helene Kelp. She  Michela Pitcher that she had called the Gastroenterology department and they told her they had not rec'd anything about MS. [place]. She ask that we call so that she could call and get the appointment.  I called her back. I let her know that out referral coordinator would be back on Monday and this would have to be addressed at that time.  That a referral had been sent.

## 2016-10-08 NOTE — Telephone Encounter (Signed)
Call to [facility].  Person answering phone stated they draw outpatient labs.  He stated they can see orders in Epic but can fax hard copy to [phone].  Copy faxed today for BMET to be drawn.    Call to nephew and left message that order has been faxed.

## 2016-10-09 ENCOUNTER — Ambulatory Visit (INDEPENDENT_AMBULATORY_CARE_PROVIDER_SITE_OTHER): Payer: Medicare HMO

## 2016-10-09 ENCOUNTER — Telehealth: Payer: Self-pay

## 2016-10-09 DIAGNOSIS — I5022 Chronic systolic (congestive) heart failure: Secondary | ICD-10-CM

## 2016-10-09 DIAGNOSIS — Z95810 Presence of automatic (implantable) cardiac defibrillator: Secondary | ICD-10-CM

## 2016-10-09 NOTE — Telephone Encounter (Signed)
Attempted call to patient's nephew, [name] and left message to send a remote transmission to recheck fluid levels today.

## 2016-10-09 NOTE — Telephone Encounter (Signed)
Nephew returned call.  He stated he could not get to his aunt's house today to send remote transmission but will send ICM remote transmission on Wednesday evening, 10/10/2016.

## 2016-10-11 ENCOUNTER — Encounter: Payer: Self-pay | Admitting: Rheumatology

## 2016-10-11 ENCOUNTER — Ambulatory Visit: Payer: Medicare HMO | Admitting: Rheumatology

## 2016-10-11 ENCOUNTER — Ambulatory Visit (INDEPENDENT_AMBULATORY_CARE_PROVIDER_SITE_OTHER): Payer: Medicare HMO | Admitting: Rheumatology

## 2016-10-11 VITALS — BP 132/59 | HR 87 | Resp 12 | Ht 65.0 in | Wt 149.0 lb

## 2016-10-11 DIAGNOSIS — N183 Chronic kidney disease, stage 3 (moderate): Secondary | ICD-10-CM

## 2016-10-11 DIAGNOSIS — Z79899 Other long term (current) drug therapy: Secondary | ICD-10-CM | POA: Diagnosis not present

## 2016-10-11 DIAGNOSIS — M1A09X0 Idiopathic chronic gout, multiple sites, without tophus (tophi): Secondary | ICD-10-CM | POA: Diagnosis not present

## 2016-10-11 DIAGNOSIS — M0579 Rheumatoid arthritis with rheumatoid factor of multiple sites without organ or systems involvement: Secondary | ICD-10-CM | POA: Diagnosis not present

## 2016-10-11 DIAGNOSIS — N1832 Chronic kidney disease, stage 3b: Secondary | ICD-10-CM

## 2016-10-11 NOTE — Progress Notes (Signed)
EPIC Encounter for ICM Monitoring  Patient Name: [name] is a 76 y.o. female Date: 10/11/2016 Primary Care Physican: [name], MD Primary Cardiologist:Koneswaran/Lawrence NP Electrophysiologist: Lovena Le Dry Weight:unknown Bi-V Pacing: 90.4%      Attempted call to nephew [name].  He stated patient's leg swelling has greatly improved but remain swollen and tender to touch but no reddness.  She also still has a cough.     Thoracic impedance improved but continues to be abnormal suggesting fluid accumulation after taking 3 days of increased Furosemide of 40 mg bid.   She has returned to taking Furosemide 40 mg 1 tablet daily.   Prescribed dosage: Furosemide 40 mg 1 tablet by mouth daily.  Klor Con 10 mEq 1 tablet daily  Labs: 10/08/2016 Creatinine 0.95, BUN 16, Potassium 3.1, Sodium 140, EGFR 56->60 09/23/2016 Creatinine 0.74, BUN 13, Potassium 4.0, Sodium 141, EGFR >60 09/22/2016 Creatinine 0.90, BUN 15, Potassium 3.9, Sodium 141, EGFR >60  09/21/2016 Creatinine 1.05, BUN 26, Potassium 3.7, Sodium 140, EGFR 50-57  09/20/2016 Creatinine 1.53, BUN 37, Potassium 3.9, Sodium 140, EGFR 31-36 09/05/2016 Creatinine 1.25, BUN 29, Potassium 4.0, Sodium 137, EGFR 40-47  08/08/2016 Creatinine 1.10, BUN 24, Potassium 3.6, Sodium 136, EGFR 47-55 07/19/2016 Creatinine 1.26, BUN 29, Potassium 3.6, Sodium 139, EGFR 40-46 06/22/2016 Creatinine 1.44, BUN 37, Potassium 3.7, Sodium 140, EGFR 34-39 06/01/2016 Creatinine 1.68, BUN 43, Potassium 4.7, Sodium 132, EGFR 28-33 01/31/2016 Creatinine 1.31, BUN 34, Potassium 4.5, Sodium 137 GFR 38-44 01/11/2016 Creatinine 1.48, BUN 38, Potassium 4.1, Sodium 135 GFR 33-38 12/20/2015 Creatinine 1.77, BUN 40, Potassium 4.0, Sodium 140, GFR 27-31 12/02/2015 Creatinine 1.38, BUN 28, Potassium 4.0, Sodium 136, GFR 36-42 11/29/2015 Creatinine 1.68, BUN 31, Potassium 4.1, Sodium 137, GFR 28-33 11/02/2015 Creatinine 1.73, BUN 41, Potassium 4.5, Sodium 137,  GFR 27-32 10/21/2015 Creatinine 2.01, BUN 35, Potassium 5.0, Sodium 136, GFR 23-26 10/12/2015 Creatinine 1.61, BUN 30, Potassium 4.0, Sodium 138, GFR 30-34 10/06/2015 Creatinine 1.45, BUN 33, Potassium 3.7, Sodium 138, GFR 34-39 09/30/2015 Creatinine 1.55, BUN 37, Potassium 4.2, Sodium 139, GFR 31-36 09/21/2015 Creatinine 1.72, BUN 49, Potassium 3.7, Sodium 138, GFR 27-32  Recommendations: Copy of ICM check sent to Jory Sims, NP, Dr [name] and Dr Lovena Le for review and if any recommendations will call back.  Advised to use ER for any urgent symptoms.   Follow-up plan: ICM clinic phone appointment on 10/18/2016 to recheck fluid levels.  Office appointment scheduled on 10/15/2016 with Andres Shad, NP.    3 month ICM trend: 10/10/2016         1 Year ICM trend:      [name], RN 10/11/2016 8:33 AM

## 2016-10-11 NOTE — Progress Notes (Signed)
Office Visit Note  Patient: [name]             Date of Birth: 1940-11-18           MRN: [id number]             PCP: [name], MD Referring: [name], MD Visit Date: 10/11/2016 Occupation: '@GUAROCC'$ @    Subjective:  No chief complaint on file.   History of Present Illness: [name] is a 76 y.o. female  Last seen 08/10/2016 for rheumatoid arthritis and gout.; She is on Plaquenil 200 mg Monday through Friday and Arava 10 mg every other day (because patient has a history of anemia and Arava can worsen the anemia.)  We asked her to return today so we can keep an eye out on her arthritis as well as gout as well as potential anemia.   For her gout, we had restarted her allopurinol at 100 mg every day. She was to restart the allopurinol 2 weeks from 08/10/2016 visit. She was to have started the colchicine on 08/10/2016 and continue that on an everyday basis.  Today, we will do the following labs CBC with differential, CMP with GFR, uric acid.  Today, she is accompanied by her nephew [name] [phone]. who gives a history that [name] was hospitalized 09/20/2016 for GI bleed. Please see Epic for full details. She was hospitalized for 6 days. She also ended up having a lot of swelling that prevented her from walking. She is not back to normal but close to it. Due to this illness, patient was asked to stop the following medications: Aspirin, colchicine, furosemide, Klor-Con, omeprazole.  She saw the gastroenterologist, Dr. Laural Golden. Dr. Loyal Jacobson able to stop her GI bleed and wanted to transfer her to [facility] for small bowel endoscopy. Patient does not have an appointment with [facility] yet.  At this time, patient's rheumatoid arthritis is doing okay. Patient's gout is also doing well.  She continues to take the Arava 10 mg every other day, Plaquenil 200 mg once a day Monday through Friday and did start the allopurinol 100 mg every day. (Note she did stop  the colchicine per advice of [facility]).  Patient's other complaint today is that her lower legs (right worse than left) are quite swollen. They had taken her off of her Lasix but patient had to restart taking it because of all the swelling. Despite restarting the medication, she still has a lot of swelling/pitting edema to her lower legs (right worse than left which is causing her discomfort). Patient's PCP, Dr. Legrand Rams, has not had a chance to see the patient after she came out of the hospital.  Activities of Daily Living:  Patient reports morning stiffness for 15 minute.   Patient Denies nocturnal pain.  Difficulty dressing/grooming: Denies Difficulty climbing stairs: Denies Difficulty getting out of chair: Denies Difficulty using hands for taps, buttons, cutlery, and/or writing: Denies   Review of Systems  Constitutional: Negative for fatigue.  HENT: Negative for mouth sores and mouth dryness.   Eyes: Negative for dryness.  Respiratory: Negative for shortness of breath.   Cardiovascular: Positive for swelling in legs/feet (3+ pitting edema bilateral lower legs (r> l).  Gastrointestinal: Negative for constipation and diarrhea.  Musculoskeletal: Positive for joint swelling (bil 3 mcp with synovial thickening (no pain)). Negative for arthralgias, joint pain, myalgias and myalgias.  Skin: Negative for sensitivity to sunlight.  Psychiatric/Behavioral: Negative for decreased concentration and sleep disturbance.  PMFS History:  Patient Active Problem List   Diagnosis Date Noted  . Acute blood loss anemia 09/20/2016  . Ischemic cardiomyopathy 09/20/2016  . Chronic combined systolic and diastolic CHF (congestive heart failure) ([place]) 09/20/2016  . CKD (chronic kidney disease) stage 3, GFR 30-59 ml/min 09/20/2016  . History of breast cancer 08/02/2016  . Primary osteoarthritis of both knees 08/02/2016  . Primary osteoarthritis of both feet 08/02/2016  . History of anemia  07/17/2016  . History of CHF (congestive heart failure) 06/04/2016  . History of chronic kidney disease 06/04/2016  . Symptomatic anemia 06/04/2016  . Elevated sedimentation rate 03/03/2016  . Idiopathic chronic gout of multiple sites without tophus 03/03/2016  . Total knee replacement status, right 03/03/2016  . High risk medication use 03/03/2016  . Chronic kidney disease (CKD) stage G3b/A1, moderately decreased glomerular filtration rate (GFR) between 30-44 mL/min/1.73 square meter and albuminuria creatinine ratio less than 30 mg/g 02/01/2016  . CAD S/P remote PCI- no details 09/02/2014  . Cardiomyopathy, ischemic-EF 30-35% March 2015 09/02/2014  . Diastolic dysfunction-grade 2 09/02/2014  . CHF exacerbation ([place]) 08/28/2014  . Acute on chronic combined systolic and diastolic congestive heart failure ([place]) 08/28/2014  . Iron deficiency anemia 08/20/2013  . Malnutrition of moderate degree ([place]) 07/21/2013  . PNA (pneumonia) 07/19/2013  . HCAP (healthcare-associated pneumonia) 07/17/2013  . Sepsis ([place]) 07/17/2013  . ARF (acute renal failure) ([place]) 07/17/2013  . Rheumatoid arthritis ([place]) 07/04/2013  . Hypokalemia 07/03/2013  . Chest pain 07/03/2013  . Biventricular ICD in place (MDT 2014) 11/06/2012  . Anemia of chronic disease   . Breast cancer ([place])   . Hypertension   . Dyslipidemia 05/24/2009  . Chronic systolic heart failure ([place]) 05/24/2009  . Primary osteoarthritis of both hands 08/11/2007    Past Medical History:  Diagnosis Date  . Anemia    Hgb of 9-10  . Anemia of chronic disease    Hgb of 9-10 chronically; 06/2010: H&H-10.7/33.5, MCV-81, normal iron studies in 2010   . Arteriosclerotic cardiovascular disease (ASCVD)    Remote PTCA by patient report; LBBB; associated cardiomyopathy, presumed ischemic with EF 40-45% previously, 20% in 06/2009; h/o clinical congestive heart failure; negative stress nuclear in 2009 with inferoseptal and apical scar  . Automatic  implantable cardioverter-defibrillator in situ   . Breast cancer ([place])    breast  . Chronic bronchitis ([name])   . Chronic renal disease, stage 3, moderately decreased glomerular filtration rate (GFR) between 30-59 mL/min/1.73 square meter 02/01/2016  . Congestive heart failure (CHF) ([place])   . Elevated cholesterol   . Elevated sed rate   . GERD (gastroesophageal reflux disease)   . Gout   . HOH (hard of hearing)   . Hyperlipidemia   . Hypertension   . Rheumatoid arthritis ([place])     Family History  Problem Relation Age of Onset  . Diabetes Father   . Pancreatic cancer Father   . Hypertension Brother    Past Surgical History:  Procedure Laterality Date  . BI-VENTRICULAR IMPLANTABLE CARDIOVERTER DEFIBRILLATOR N/A 07/28/2012   Procedure: BI-VENTRICULAR IMPLANTABLE CARDIOVERTER DEFIBRILLATOR  (CRT-D);  Surgeon: Evans Lance, MD;  Location: [facility] CATH LAB;  Service: Cardiovascular;  Laterality: N/A;  . BI-VENTRICULAR IMPLANTABLE CARDIOVERTER DEFIBRILLATOR  (CRT-D)  07/28/2012  . BREAST BIOPSY Bilateral   . CATARACT EXTRACTION W/ INTRAOCULAR LENS IMPLANT Left   . CATARACT EXTRACTION W/PHACO Right 09/15/2013   Procedure: CATARACT EXTRACTION PHACO AND INTRAOCULAR LENS PLACEMENT (IOC);  Surgeon: Elta Guadeloupe T. Gershon Crane, MD;  Location:  [facility] ORS;  Service: Ophthalmology;  Laterality: Right;  CDE:  10.74  . COLONOSCOPY N/A 03/04/2015   Procedure: COLONOSCOPY;  Surgeon: Rogene Houston, MD;  Location: [facility] ENDO SUITE;  Service: Endoscopy;  Laterality: N/A;  10:50   . ESOPHAGOGASTRODUODENOSCOPY N/A 03/04/2015   Procedure: ESOPHAGOGASTRODUODENOSCOPY (EGD);  Surgeon: Rogene Houston, MD;  Location: [facility] ENDO SUITE;  Service: Endoscopy;  Laterality: N/A;  . ESOPHAGOGASTRODUODENOSCOPY N/A 09/21/2016   Procedure: ESOPHAGOGASTRODUODENOSCOPY (EGD);  Surgeon: Rogene Houston, MD;  Location: [facility] ENDO SUITE;  Service: Endoscopy;  Laterality: N/A;  . GIVENS CAPSULE STUDY N/A 09/22/2016   Procedure: GIVENS CAPSULE STUDY;  Surgeon:  Rogene Houston, MD;  Location: [facility] ENDO SUITE;  Service: Endoscopy;  Laterality: N/A;  . MASTECTOMY Left 1998  . TOTAL KNEE ARTHROPLASTY Right    Dr.Harrison  . TUBAL LIGATION     Social History   Social History Narrative   Married, lives with spouse   1 daughter, died in 66 in a car accident   Retired - worked in [employer]   No recent travel     Objective: Vital Signs: BP (!) 132/59   Pulse 87   Resp 12   Ht '5\' 5"'$  (1.651 m)   Wt 149 lb (67.6 kg)   BMI 24.79 kg/m    Physical Exam  Constitutional: She is oriented to person, place, and time. She appears well-developed and well-nourished.  HENT:  Head: Normocephalic and atraumatic.  Eyes: EOM are normal. Pupils are equal, round, and reactive to light.  Cardiovascular: Normal rate, regular rhythm and normal heart sounds.  Exam reveals no gallop and no friction rub.   No murmur heard. Pulmonary/Chest: Effort normal and breath sounds normal. She has no wheezes. She has no rales.  Abdominal: Soft. Bowel sounds are normal. She exhibits no distension. There is no tenderness. There is no guarding. No hernia.  Musculoskeletal: Normal range of motion. She exhibits edema (3+ pitting edema bilateral lower legs (r> l)). She exhibits no tenderness or deformity.  Lymphadenopathy:    She has no cervical adenopathy.  Neurological: She is alert and oriented to person, place, and time. Coordination normal.  Skin: Skin is warm and dry. Capillary refill takes less than 2 seconds. No rash noted.  Psychiatric: She has a normal mood and affect. Her behavior is normal.  Nursing note and vitals reviewed.    Musculoskeletal Exam:  Full range of motion of all joints Grip strength is equal and strong bilaterally Fiber myalgia tender points are all absent   CDAI Exam: CDAI Homunculus Exam:   Joint Counts:  CDAI Tender Joint count: 0 CDAI Swollen Joint count: 0  Synovial thickening without tenderness to the bilateral third MCP joint. Mild  ulnar deviation bilaterally. Patient's main pain is coming from peripheral edema which is 3+ bilaterally.   Investigation: No additional findings.  Hospital Outpatient Visit on 10/08/2016  Component Date Value Ref Range Status  . Sodium 10/08/2016 140  135 - 145 mmol/L Final  . Potassium 10/08/2016 3.1* 3.5 - 5.1 mmol/L Final  . Chloride 10/08/2016 103  101 - 111 mmol/L Final  . CO2 10/08/2016 26  22 - 32 mmol/L Final  . Glucose, Bld 10/08/2016 113* 65 - 99 mg/dL Final  . BUN 10/08/2016 16  6 - 20 mg/dL Final  . Creatinine, Ser 10/08/2016 0.95  0.44 - 1.00 mg/dL Final  . Calcium 10/08/2016 8.6* 8.9 - 10.3 mg/dL Final  . GFR calc non Af Amer 10/08/2016 56* >60 mL/min Final  .  GFR calc Af Amer 10/08/2016 >60  >60 mL/min Final   Comment: (NOTE) The eGFR has been calculated using the CKD EPI equation. This calculation has not been validated in all clinical situations. eGFR's persistently <60 mL/min signify possible Chronic Kidney Disease.   Georgiann Hahn gap 10/08/2016 11  5 - 15 Final  Hospital Outpatient Visit on 10/05/2016  Component Date Value Ref Range Status  . Hemoglobin 10/05/2016 9.2* 12.0 - 15.0 g/dL Final  . HCT 10/05/2016 29.4* 36.0 - 46.0 % Final  Admission on 09/20/2016, Discharged on 09/25/2016  Component Date Value Ref Range Status  . Sodium 09/20/2016 140  135 - 145 mmol/L Final  . Potassium 09/20/2016 3.9  3.5 - 5.1 mmol/L Final  . Chloride 09/20/2016 106  101 - 111 mmol/L Final  . CO2 09/20/2016 24  22 - 32 mmol/L Final  . Glucose, Bld 09/20/2016 112* 65 - 99 mg/dL Final  . BUN 09/20/2016 37* 6 - 20 mg/dL Final  . Creatinine, Ser 09/20/2016 1.53* 0.44 - 1.00 mg/dL Final  . Calcium 09/20/2016 8.9  8.9 - 10.3 mg/dL Final  . Total Protein 09/20/2016 7.7  6.5 - 8.1 g/dL Final  . Albumin 09/20/2016 2.9* 3.5 - 5.0 g/dL Final  . AST 09/20/2016 37  15 - 41 U/L Final  . ALT 09/20/2016 22  14 - 54 U/L Final  . Alkaline Phosphatase 09/20/2016 117  38 - 126 U/L Final  .  Total Bilirubin 09/20/2016 0.1* 0.3 - 1.2 mg/dL Final  . GFR calc non Af Amer 09/20/2016 31* >60 mL/min Final  . GFR calc Af Amer 09/20/2016 36* >60 mL/min Final   Comment: (NOTE) The eGFR has been calculated using the CKD EPI equation. This calculation has not been validated in all clinical situations. eGFR's persistently <60 mL/min signify possible Chronic Kidney Disease.   . Anion gap 09/20/2016 10  5 - 15 Final  . WBC 09/20/2016 5.1  4.0 - 10.5 K/uL Final  . RBC 09/20/2016 3.16* 3.87 - 5.11 MIL/uL Final  . Hemoglobin 09/20/2016 7.5* 12.0 - 15.0 g/dL Final  . HCT 09/20/2016 25.2* 36.0 - 46.0 % Final  . MCV 09/20/2016 79.7  78.0 - 100.0 fL Final  . MCH 09/20/2016 23.7* 26.0 - 34.0 pg Final  . MCHC 09/20/2016 29.8* 30.0 - 36.0 g/dL Final  . RDW 09/20/2016 19.8* 11.5 - 15.5 % Final  . Platelets 09/20/2016 288  150 - 400 K/uL Final  . ABO/RH(D) 09/20/2016 AB POS   Final  . Antibody Screen 09/20/2016 NEG   Final  . Sample Expiration 09/20/2016 09/23/2016   Final  . Unit Number 09/20/2016 [id number]   Final  . Blood Component Type 09/20/2016 RED CELLS,LR   Final  . Unit division 09/20/2016 00   Final  . Status of Unit 09/20/2016 ISSUED,FINAL   Final  . Transfusion Status 09/20/2016 OK TO TRANSFUSE   Final  . Crossmatch Result 09/20/2016 Compatible   Final  . Fecal Occult Bld 09/20/2016 POSITIVE* NEGATIVE Final  . Sodium 09/21/2016 140  135 - 145 mmol/L Final  . Potassium 09/21/2016 3.7  3.5 - 5.1 mmol/L Final  . Chloride 09/21/2016 106  101 - 111 mmol/L Final  . CO2 09/21/2016 24  22 - 32 mmol/L Final  . Glucose, Bld 09/21/2016 76  65 - 99 mg/dL Final  . BUN 09/21/2016 26* 6 - 20 mg/dL Final  . Creatinine, Ser 09/21/2016 1.05* 0.44 - 1.00 mg/dL Final  . Calcium 09/21/2016 8.4* 8.9 - 10.3 mg/dL Final  .  GFR calc non Af Amer 09/21/2016 50* >60 mL/min Final  . GFR calc Af Amer 09/21/2016 57* >60 mL/min Final   Comment: (NOTE) The eGFR has been calculated using the CKD EPI  equation. This calculation has not been validated in all clinical situations. eGFR's persistently <60 mL/min signify possible Chronic Kidney Disease.   . Anion gap 09/21/2016 10  5 - 15 Final  . WBC 09/21/2016 4.1  4.0 - 10.5 K/uL Final  . RBC 09/21/2016 3.05* 3.87 - 5.11 MIL/uL Final  . Hemoglobin 09/21/2016 7.4* 12.0 - 15.0 g/dL Final  . HCT 12/04/4816 23.7* 36.0 - 46.0 % Final  . MCV 09/21/2016 77.7* 78.0 - 100.0 fL Final  . MCH 09/21/2016 24.3* 26.0 - 34.0 pg Final  . MCHC 09/21/2016 31.2  30.0 - 36.0 g/dL Final  . RDW 56/31/4970 20.3* 11.5 - 15.5 % Final  . Platelets 09/21/2016 280  150 - 400 K/uL Final  . aPTT 09/20/2016 24  24 - 36 seconds Final  . Prothrombin Time 09/20/2016 13.9  11.4 - 15.2 seconds Final  . INR 09/20/2016 1.06   Final  . MRSA by PCR 09/20/2016 POSITIVE* NEGATIVE Final   Comment:        The GeneXpert MRSA Assay (FDA approved for NASAL specimens only), is one component of a comprehensive MRSA colonization surveillance program. It is not intended to diagnose MRSA infection nor to guide or monitor treatment for MRSA infections. RESULT CALLED TO, READ BACK BY AND VERIFIED WITH: LATNER,N ON 09/20/16 AT 2255 BY LOY,C   . Order Confirmation 09/21/2016 ORDER PROCESSED BY BLOOD BANK   Final  . ISSUE DATE / TIME 09/20/2016 [id number]   Final  . Blood Product Unit Number 09/20/2016 [id number]   Final  . PRODUCT CODE 09/20/2016 [id number]   Final  . Unit Type and Rh 09/20/2016 6200   Final  . Blood Product Expiration Date 09/20/2016 [id number]   Final  . Hemoglobin 09/21/2016 9.8* 12.0 - 15.0 g/dL Final   POST TRANSFUSION SPECIMEN  . HCT 09/21/2016 31.1* 36.0 - 46.0 % Final  . Sodium 09/22/2016 141  135 - 145 mmol/L Final  . Potassium 09/22/2016 3.9  3.5 - 5.1 mmol/L Final  . Chloride 09/22/2016 111  101 - 111 mmol/L Final  . CO2 09/22/2016 23  22 - 32 mmol/L Final  . Glucose, Bld 09/22/2016 96  65 - 99 mg/dL Final  . BUN 36/62/9476 15  6 - 20 mg/dL  Final  . Creatinine, Ser 09/22/2016 0.90  0.44 - 1.00 mg/dL Final  . Calcium 54/65/0354 8.2* 8.9 - 10.3 mg/dL Final  . GFR calc non Af Amer 09/22/2016 60* >60 mL/min Final  . GFR calc Af Amer 09/22/2016 >60  >60 mL/min Final   Comment: (NOTE) The eGFR has been calculated using the CKD EPI equation. This calculation has not been validated in all clinical situations. eGFR's persistently <60 mL/min signify possible Chronic Kidney Disease.   . Anion gap 09/22/2016 7  5 - 15 Final  . WBC 09/22/2016 5.5  4.0 - 10.5 K/uL Final  . RBC 09/22/2016 3.61* 3.87 - 5.11 MIL/uL Final  . Hemoglobin 09/22/2016 9.0* 12.0 - 15.0 g/dL Final  . HCT 65/68/1275 28.9* 36.0 - 46.0 % Final  . MCV 09/22/2016 80.1  78.0 - 100.0 fL Final  . MCH 09/22/2016 24.9* 26.0 - 34.0 pg Final  . MCHC 09/22/2016 31.1  30.0 - 36.0 g/dL Final  . RDW 17/00/1749 19.2* 11.5 - 15.5 % Final  . Platelets  09/22/2016 240  150 - 400 K/uL Final  . Sodium 09/23/2016 141  135 - 145 mmol/L Final  . Potassium 09/23/2016 4.0  3.5 - 5.1 mmol/L Final  . Chloride 09/23/2016 113* 101 - 111 mmol/L Final  . CO2 09/23/2016 19* 22 - 32 mmol/L Final  . Glucose, Bld 09/23/2016 93  65 - 99 mg/dL Final  . BUN 09/23/2016 13  6 - 20 mg/dL Final  . Creatinine, Ser 09/23/2016 0.74  0.44 - 1.00 mg/dL Final  . Calcium 09/23/2016 8.2* 8.9 - 10.3 mg/dL Final  . GFR calc non Af Amer 09/23/2016 >60  >60 mL/min Final  . GFR calc Af Amer 09/23/2016 >60  >60 mL/min Final   Comment: (NOTE) The eGFR has been calculated using the CKD EPI equation. This calculation has not been validated in all clinical situations. eGFR's persistently <60 mL/min signify possible Chronic Kidney Disease.   . Anion gap 09/23/2016 9  5 - 15 Final  . WBC 09/23/2016 5.5  4.0 - 10.5 K/uL Final  . RBC 09/23/2016 3.19* 3.87 - 5.11 MIL/uL Final  . Hemoglobin 09/23/2016 8.0* 12.0 - 15.0 g/dL Final  . HCT 09/23/2016 25.6* 36.0 - 46.0 % Final  . MCV 09/23/2016 80.3  78.0 - 100.0 fL Final    . MCH 09/23/2016 25.1* 26.0 - 34.0 pg Final  . MCHC 09/23/2016 31.3  30.0 - 36.0 g/dL Final  . RDW 09/23/2016 19.7* 11.5 - 15.5 % Final  . Platelets 09/23/2016 233  150 - 400 K/uL Final  . Glucose-Capillary 09/23/2016 109* 65 - 99 mg/dL Final  . Hemoglobin 09/23/2016 8.1* 12.0 - 15.0 g/dL Final  . HCT 09/23/2016 26.1* 36.0 - 46.0 % Final  . WBC 09/24/2016 5.6  4.0 - 10.5 K/uL Final  . RBC 09/24/2016 3.02* 3.87 - 5.11 MIL/uL Final  . Hemoglobin 09/24/2016 7.6* 12.0 - 15.0 g/dL Final  . HCT 09/24/2016 24.2* 36.0 - 46.0 % Final  . MCV 09/24/2016 80.1  78.0 - 100.0 fL Final  . MCH 09/24/2016 25.2* 26.0 - 34.0 pg Final  . MCHC 09/24/2016 31.4  30.0 - 36.0 g/dL Final  . RDW 09/24/2016 19.9* 11.5 - 15.5 % Final  . Platelets 09/24/2016 216  150 - 400 K/uL Final  . WBC 09/25/2016 7.2  4.0 - 10.5 K/uL Final  . RBC 09/25/2016 3.28* 3.87 - 5.11 MIL/uL Final  . Hemoglobin 09/25/2016 8.3* 12.0 - 15.0 g/dL Final  . HCT 09/25/2016 26.3* 36.0 - 46.0 % Final  . MCV 09/25/2016 80.2  78.0 - 100.0 fL Final  . MCH 09/25/2016 25.3* 26.0 - 34.0 pg Final  . MCHC 09/25/2016 31.6  30.0 - 36.0 g/dL Final  . RDW 09/25/2016 19.7* 11.5 - 15.5 % Final  . Platelets 09/25/2016 222  150 - 400 K/uL Final  Appointment on 09/05/2016  Component Date Value Ref Range Status  . WBC 09/05/2016 4.6  4.0 - 10.5 K/uL Final  . RBC 09/05/2016 3.54* 3.87 - 5.11 MIL/uL Final  . Hemoglobin 09/05/2016 8.5* 12.0 - 15.0 g/dL Final  . HCT 09/05/2016 27.4* 36.0 - 46.0 % Final  . MCV 09/05/2016 77.4* 78.0 - 100.0 fL Final  . MCH 09/05/2016 24.0* 26.0 - 34.0 pg Final  . MCHC 09/05/2016 31.0  30.0 - 36.0 g/dL Final  . RDW 09/05/2016 20.6* 11.5 - 15.5 % Final  . Platelets 09/05/2016 226  150 - 400 K/uL Final  . Neutrophils Relative % 09/05/2016 49  % Final  . Neutro Abs 09/05/2016 2.3  1.7 - 7.7 K/uL Final  . Lymphocytes Relative 09/05/2016 25  % Final  . Lymphs Abs 09/05/2016 1.2  0.7 - 4.0 K/uL Final  . Monocytes Relative  09/05/2016 20  % Final  . Monocytes Absolute 09/05/2016 0.9  0.1 - 1.0 K/uL Final  . Eosinophils Relative 09/05/2016 4  % Final  . Eosinophils Absolute 09/05/2016 0.2  0.0 - 0.7 K/uL Final  . Basophils Relative 09/05/2016 2  % Final  . Basophils Absolute 09/05/2016 0.1  0.0 - 0.1 K/uL Final  . Sodium 09/05/2016 137  135 - 145 mmol/L Final  . Potassium 09/05/2016 4.0  3.5 - 5.1 mmol/L Final  . Chloride 09/05/2016 103  101 - 111 mmol/L Final  . CO2 09/05/2016 24  22 - 32 mmol/L Final  . Glucose, Bld 09/05/2016 97  65 - 99 mg/dL Final  . BUN 09/05/2016 29* 6 - 20 mg/dL Final  . Creatinine, Ser 09/05/2016 1.25* 0.44 - 1.00 mg/dL Final  . Calcium 09/05/2016 9.0  8.9 - 10.3 mg/dL Final  . Total Protein 09/05/2016 7.9  6.5 - 8.1 g/dL Final  . Albumin 09/05/2016 2.9* 3.5 - 5.0 g/dL Final  . AST 09/05/2016 19  15 - 41 U/L Final  . ALT 09/05/2016 13* 14 - 54 U/L Final  . Alkaline Phosphatase 09/05/2016 128* 38 - 126 U/L Final  . Total Bilirubin 09/05/2016 0.3  0.3 - 1.2 mg/dL Final  . GFR calc non Af Amer 09/05/2016 40* >60 mL/min Final  . GFR calc Af Amer 09/05/2016 47* >60 mL/min Final   Comment: (NOTE) The eGFR has been calculated using the CKD EPI equation. This calculation has not been validated in all clinical situations. eGFR's persistently <60 mL/min signify possible Chronic Kidney Disease.   . Anion gap 09/05/2016 10  5 - 15 Final  Appointment on 08/08/2016  Component Date Value Ref Range Status  . Erythropoietin 08/08/2016 44.1* 2.6 - 18.5 mIU/mL Final   Comment: (NOTE) Siemens Immulite 2000 Immunochemiluminometric assay [facility]) Performed At: [facility] [place], [place] [id number] Lindon Romp MD JQ:[id number]   . WBC 08/08/2016 5.5  4.0 - 10.5 K/uL Final  . RBC 08/08/2016 3.68* 3.87 - 5.11 MIL/uL Final  . Hemoglobin 08/08/2016 8.7* 12.0 - 15.0 g/dL Final  . HCT 08/08/2016 28.0* 36.0 - 46.0 % Final  . MCV 08/08/2016 76.1* 78.0 - 100.0 fL Final  .  MCH 08/08/2016 23.6* 26.0 - 34.0 pg Final  . MCHC 08/08/2016 31.1  30.0 - 36.0 g/dL Final  . RDW 08/08/2016 21.5* 11.5 - 15.5 % Final  . Platelets 08/08/2016 260  150 - 400 K/uL Final  . Neutrophils Relative % 08/08/2016 54  % Final  . Neutro Abs 08/08/2016 3.0  1.7 - 7.7 K/uL Final  . Lymphocytes Relative 08/08/2016 28  % Final  . Lymphs Abs 08/08/2016 1.5  0.7 - 4.0 K/uL Final  . Monocytes Relative 08/08/2016 14  % Final  . Monocytes Absolute 08/08/2016 0.8  0.1 - 1.0 K/uL Final  . Eosinophils Relative 08/08/2016 3  % Final  . Eosinophils Absolute 08/08/2016 0.2  0.0 - 0.7 K/uL Final  . Basophils Relative 08/08/2016 1  % Final  . Basophils Absolute 08/08/2016 0.1  0.0 - 0.1 K/uL Final  . Ferritin 08/08/2016 283  11 - 307 ng/mL Final   Performed at [facility], [place] [address], [place], [place] [address]  . Iron 08/08/2016 30  28 - 170 ug/dL Final  . TIBC  08/08/2016 167* 250 - 450 ug/dL Final  . Saturation Ratios 08/08/2016 18  10.4 - 31.8 % Final  . UIBC 08/08/2016 137  ug/dL Final   Performed at [facility], [place] [address] [place], [place] [address]  . Sodium 08/08/2016 136  135 - 145 mmol/L Final  . Potassium 08/08/2016 3.6  3.5 - 5.1 mmol/L Final  . Chloride 08/08/2016 102  101 - 111 mmol/L Final  . CO2 08/08/2016 24  22 - 32 mmol/L Final  . Glucose, Bld 08/08/2016 101* 65 - 99 mg/dL Final  . BUN 08/08/2016 24* 6 - 20 mg/dL Final  . Creatinine, Ser 08/08/2016 1.10* 0.44 - 1.00 mg/dL Final  . Calcium 08/08/2016 8.8* 8.9 - 10.3 mg/dL Final  . Total Protein 08/08/2016 7.7  6.5 - 8.1 g/dL Final  . Albumin 08/08/2016 2.7* 3.5 - 5.0 g/dL Final  . AST 08/08/2016 18  15 - 41 U/L Final  . ALT 08/08/2016 12* 14 - 54 U/L Final  . Alkaline Phosphatase 08/08/2016 110  38 - 126 U/L Final  . Total Bilirubin 08/08/2016 0.1* 0.3 - 1.2 mg/dL Final  . GFR calc non Af Amer 08/08/2016 47* >60 mL/min Final  . GFR calc Af Amer 08/08/2016 55* >60 mL/min Final   Comment: (NOTE) The  eGFR has been calculated using the CKD EPI equation. This calculation has not been validated in all clinical situations. eGFR's persistently <60 mL/min signify possible Chronic Kidney Disease.   . Anion gap 08/08/2016 10  5 - 15 Final  Clinical Support on 08/06/2016  Component Date Value Ref Range Status  . Date Time Interrogation Session 08/08/2016 [id number]   Final  . Pulse Generator Manufacturer 08/08/2016 MERM   Final  . Pulse Gen Model 08/08/2016 [id number] Viva XT CRT-D   Final  . Pulse Gen Serial Number 08/08/2016 [id number]   Final  . [facility] Name 08/08/2016 [facility]   Final  . Implantable Pulse Generator Type 08/08/2016 Cardiac Resynch Therapy Defibulator   Final  . Implantable Pulse Generator Implan* 08/08/2016 [id number]   Final  . Implantable Lead Manufacturer 08/08/2016 MERM   Final  . Implantable Lead Model 08/08/2016 [id number] Attain OTW   Final  . Implantable Lead Serial Number 08/08/2016 [id number]   Final  . Implantable Lead Implant Date 08/08/2016 [id number]   Final  . Implantable Lead Location Detail 1 08/08/2016 Lateral Wall   Final  . Implantable Lead Location 08/08/2016 [id number]   Final  . Implantable Lead Manufacturer 08/08/2016 MERM   Final  . Implantable Lead Model 08/08/2016 5076 CapSureFix Novus   Final  . Implantable Lead Serial Number 08/08/2016 [id number]   Final  . Implantable Lead Implant Date 08/08/2016 [id number]   Final  . Implantable Lead Location Detail 1 08/08/2016 APPENDAGE   Final  . Implantable Lead Location 08/08/2016 [id number]   Final  . Implantable Lead Manufacturer 08/08/2016 MERM   Final  . Implantable Lead Model 08/08/2016 6935 Sprint Quattro Secure S   Final  . Implantable Lead Serial Number 08/08/2016 [id number]   Final  . Implantable Lead Implant Date 08/08/2016 [id number]   Final  . Implantable Lead Location Detail 1 08/08/2016 APEX   Final  . Implantable Lead Location 08/08/2016 [id number]   Final  . Lead Channel Setting Sensing Sensi*  08/08/2016 0.3  mV Final  . Lead Channel Setting Pacing Amplit* 08/08/2016 2  V Final  . Lead Channel Setting Pacing Pulse * 08/08/2016 0.4  ms Final  . Lead Channel Setting Pacing  Amplit* 08/08/2016 2.5  V Final  . Lead Channel Setting Pacing Pulse * 08/08/2016 0.5  ms Final  . Lead Channel Setting Pacing Amplit* 08/08/2016 1.75  V Final  . Lead Channel Setting Pacing Captur* 08/08/2016 Adaptive Capture   Final  . Lead Channel Impedance Value 08/08/2016 380  ohm Final  . Lead Channel Sensing Intrinsic Amp* 08/08/2016 3  mV Final  . Lead Channel Sensing Intrinsic Amp* 08/08/2016 3  mV Final  . Lead Channel Pacing Threshold Ampl* 08/08/2016 0.625  V Final  . Lead Channel Pacing Threshold Puls* 08/08/2016 0.4  ms Final  . Lead Channel Impedance Value 08/08/2016 513  ohm Final  . Lead Channel Impedance Value 08/08/2016 399  ohm Final  . Lead Channel Sensing Intrinsic Amp* 08/08/2016 27.375  mV Final  . Lead Channel Sensing Intrinsic Amp* 08/08/2016 27.375  mV Final  . Lead Channel Pacing Threshold Ampl* 08/08/2016 1.25  V Final  . Lead Channel Pacing Threshold Puls* 08/08/2016 0.4  ms Final  . HighPow Impedance 08/08/2016 83  ohm Final  . Lead Channel Impedance Value 08/08/2016 912  ohm Final  . Lead Channel Impedance Value 08/08/2016 646  ohm Final  . Lead Channel Impedance Value 08/08/2016 380  ohm Final  . Lead Channel Pacing Threshold Ampl* 08/08/2016 0.625  V Final  . Lead Channel Pacing Threshold Puls* 08/08/2016 0.5  ms Final  . Battery Status 08/08/2016 OK   Final  . Battery Remaining Longevity 08/08/2016 64  mo Final  . Battery Voltage 08/08/2016 2.94  V Final  . Brady Statistic RA Percent Paced 08/08/2016 10.05  % Final  . Brady Statistic RV Percent Paced 08/08/2016 0.90  % Final  . Brady Statistic AP VP Percent 08/08/2016 9.48  % Final  . Brady Statistic AS VP Percent 08/08/2016 88.15  % Final  . Brady Statistic AP VS Percent 08/08/2016 0.71  % Final  . Brady Statistic AS VS  Percent 08/08/2016 1.67  % Final  . Eval Rhythm 08/08/2016 AsVp   Final  Appointment on 08/03/2016  Component Date Value Ref Range Status  . Blood Bank Specimen 08/03/2016 SAMPLE AVAILABLE FOR TESTING   Final  . Sample Expiration 08/03/2016 08/06/2016   Final  . WBC 08/03/2016 5.6  4.0 - 10.5 K/uL Final  . RBC 08/03/2016 3.49* 3.87 - 5.11 MIL/uL Final  . Hemoglobin 08/03/2016 8.3* 12.0 - 15.0 g/dL Final  . HCT 89/01/1918 26.3* 36.0 - 46.0 % Final  . MCV 08/03/2016 75.4* 78.0 - 100.0 fL Final  . MCH 08/03/2016 23.8* 26.0 - 34.0 pg Final  . MCHC 08/03/2016 31.6  30.0 - 36.0 g/dL Final  . RDW 91/81/1279 21.6* 11.5 - 15.5 % Final  . Platelets 08/03/2016 232  150 - 400 K/uL Final  . Neutrophils Relative % 08/03/2016 63  % Final  . Neutro Abs 08/03/2016 3.5  1.7 - 7.7 K/uL Final  . Lymphocytes Relative 08/03/2016 21  % Final  . Lymphs Abs 08/03/2016 1.2  0.7 - 4.0 K/uL Final  . Monocytes Relative 08/03/2016 14  % Final  . Monocytes Absolute 08/03/2016 0.8  0.1 - 1.0 K/uL Final  . Eosinophils Relative 08/03/2016 2  % Final  . Eosinophils Absolute 08/03/2016 0.1  0.0 - 0.7 K/uL Final  . Basophils Relative 08/03/2016 1  % Final  . Basophils Absolute 08/03/2016 0.0  0.0 - 0.1 K/uL Final  . Smear Review 08/03/2016 MORPHOLOGY UNREMARKABLE   Final  Hospital Outpatient Visit on 07/19/2016  Component Date  Value Ref Range Status  . WBC 07/19/2016 6.4  4.0 - 10.5 K/uL Final  . RBC 07/19/2016 3.69* 3.87 - 5.11 MIL/uL Final   ANISOCYTES  . Hemoglobin 07/19/2016 8.8* 12.0 - 15.0 g/dL Final  . HCT 07/19/2016 27.9* 36.0 - 46.0 % Final  . MCV 07/19/2016 75.6* 78.0 - 100.0 fL Final  . MCH 07/19/2016 23.8* 26.0 - 34.0 pg Final  . MCHC 07/19/2016 31.5  30.0 - 36.0 g/dL Final  . RDW 07/19/2016 21.6* 11.5 - 15.5 % Final  . Platelets 07/19/2016 226  150 - 400 K/uL Final  . Neutrophils Relative % 07/19/2016 61  % Final  . Neutro Abs 07/19/2016 3.9  1.7 - 7.7 K/uL Final  . Lymphocytes Relative 07/19/2016 22   % Final  . Lymphs Abs 07/19/2016 1.4  0.7 - 4.0 K/uL Final  . Monocytes Relative 07/19/2016 14  % Final  . Monocytes Absolute 07/19/2016 0.9  0.1 - 1.0 K/uL Final  . Eosinophils Relative 07/19/2016 2  % Final  . Eosinophils Absolute 07/19/2016 0.2  0.0 - 0.7 K/uL Final  . Basophils Relative 07/19/2016 1  % Final  . Basophils Absolute 07/19/2016 0.1  0.0 - 0.1 K/uL Final  . Sodium 07/19/2016 139  135 - 145 mmol/L Final  . Potassium 07/19/2016 3.6  3.5 - 5.1 mmol/L Final  . Chloride 07/19/2016 106  101 - 111 mmol/L Final  . CO2 07/19/2016 24  22 - 32 mmol/L Final  . Glucose, Bld 07/19/2016 103* 65 - 99 mg/dL Final  . BUN 07/19/2016 29* 6 - 20 mg/dL Final  . Creatinine, Ser 07/19/2016 1.26* 0.44 - 1.00 mg/dL Final  . Calcium 07/19/2016 8.7* 8.9 - 10.3 mg/dL Final  . Total Protein 07/19/2016 7.8  6.5 - 8.1 g/dL Final  . Albumin 07/19/2016 2.6* 3.5 - 5.0 g/dL Final  . AST 07/19/2016 18  15 - 41 U/L Final  . ALT 07/19/2016 11* 14 - 54 U/L Final  . Alkaline Phosphatase 07/19/2016 110  38 - 126 U/L Final  . Total Bilirubin 07/19/2016 0.4  0.3 - 1.2 mg/dL Final  . GFR calc non Af Amer 07/19/2016 40* >60 mL/min Final  . GFR calc Af Amer 07/19/2016 46* >60 mL/min Final   Comment: (NOTE) The eGFR has been calculated using the CKD EPI equation. This calculation has not been validated in all clinical situations. eGFR's persistently <60 mL/min signify possible Chronic Kidney Disease.   . Anion gap 07/19/2016 9  5 - 15 Final  . Uric Acid, Serum 07/19/2016 13.0* 2.3 - 6.6 mg/dL Final  Appointment on 07/19/2016  Component Date Value Ref Range Status  . Blood Bank Specimen 07/19/2016 SAMPLE AVAILABLE FOR TESTING   Final  . Sample Expiration 07/19/2016 07/22/2016   Final  . WBC 07/19/2016 6.2  4.0 - 10.5 K/uL Final  . RBC 07/19/2016 3.72* 3.87 - 5.11 MIL/uL Final   ANISOCYTES  . Hemoglobin 07/19/2016 8.9* 12.0 - 15.0 g/dL Final  . HCT 07/19/2016 28.1* 36.0 - 46.0 % Final  . MCV 07/19/2016 75.5*  78.0 - 100.0 fL Final  . MCH 07/19/2016 23.9* 26.0 - 34.0 pg Final  . MCHC 07/19/2016 31.7  30.0 - 36.0 g/dL Final  . RDW 07/19/2016 21.6* 11.5 - 15.5 % Final  . Platelets 07/19/2016 219  150 - 400 K/uL Final  . Neutrophils Relative % 07/19/2016 62  % Final  . Neutro Abs 07/19/2016 3.8  1.7 - 7.7 K/uL Final  . Lymphocytes Relative 07/19/2016 24  %  Final  . Lymphs Abs 07/19/2016 1.5  0.7 - 4.0 K/uL Final  . Monocytes Relative 07/19/2016 11  % Final  . Monocytes Absolute 07/19/2016 0.7  0.1 - 1.0 K/uL Final  . Eosinophils Relative 07/19/2016 2  % Final  . Eosinophils Absolute 07/19/2016 0.1  0.0 - 0.7 K/uL Final  . Basophils Relative 07/19/2016 1  % Final  . Basophils Absolute 07/19/2016 0.0  0.0 - 0.1 K/uL Final  . Sodium 07/19/2016 140  135 - 145 mmol/L Final  . Potassium 07/19/2016 3.6  3.5 - 5.1 mmol/L Final  . Chloride 07/19/2016 107  101 - 111 mmol/L Final  . CO2 07/19/2016 25  22 - 32 mmol/L Final  . Glucose, Bld 07/19/2016 105* 65 - 99 mg/dL Final  . BUN 07/19/2016 28* 6 - 20 mg/dL Final  . Creatinine, Ser 07/19/2016 1.15* 0.44 - 1.00 mg/dL Final  . Calcium 07/19/2016 8.8* 8.9 - 10.3 mg/dL Final  . Total Protein 07/19/2016 7.7  6.5 - 8.1 g/dL Final  . Albumin 07/19/2016 2.7* 3.5 - 5.0 g/dL Final  . AST 07/19/2016 19  15 - 41 U/L Final  . ALT 07/19/2016 12* 14 - 54 U/L Final  . Alkaline Phosphatase 07/19/2016 110  38 - 126 U/L Final  . Total Bilirubin 07/19/2016 0.6  0.3 - 1.2 mg/dL Final  . GFR calc non Af Amer 07/19/2016 45* >60 mL/min Final  . GFR calc Af Amer 07/19/2016 52* >60 mL/min Final   Comment: (NOTE) The eGFR has been calculated using the CKD EPI equation. This calculation has not been validated in all clinical situations. eGFR's persistently <60 mL/min signify possible Chronic Kidney Disease.   . Anion gap 07/19/2016 8  5 - 15 Final  Appointment on 07/06/2016  Component Date Value Ref Range Status  . WBC 07/06/2016 6.5  4.0 - 10.5 K/uL Final  . RBC  07/06/2016 4.02  3.87 - 5.11 MIL/uL Final  . Hemoglobin 07/06/2016 9.5* 12.0 - 15.0 g/dL Final  . HCT 07/06/2016 30.4* 36.0 - 46.0 % Final  . MCV 07/06/2016 75.6* 78.0 - 100.0 fL Final  . MCH 07/06/2016 23.6* 26.0 - 34.0 pg Final  . MCHC 07/06/2016 31.3  30.0 - 36.0 g/dL Final  . RDW 07/06/2016 21.1* 11.5 - 15.5 % Final  . Platelets 07/06/2016 232  150 - 400 K/uL Final  . Neutrophils Relative % 07/06/2016 65  % Final  . Neutro Abs 07/06/2016 4.2  1.7 - 7.7 K/uL Final  . Lymphocytes Relative 07/06/2016 23  % Final  . Lymphs Abs 07/06/2016 1.5  0.7 - 4.0 K/uL Final  . Monocytes Relative 07/06/2016 10  % Final  . Monocytes Absolute 07/06/2016 0.7  0.1 - 1.0 K/uL Final  . Eosinophils Relative 07/06/2016 2  % Final  . Eosinophils Absolute 07/06/2016 0.1  0.0 - 0.7 K/uL Final  . Basophils Relative 07/06/2016 1  % Final  . Basophils Absolute 07/06/2016 0.1  0.0 - 0.1 K/uL Final  . Smear Review 07/06/2016 MORPHOLOGY UNREMARKABLE   Final  . Iron 07/06/2016 26* 28 - 170 ug/dL Final  . TIBC 07/06/2016 172* 250 - 450 ug/dL Final  . Saturation Ratios 07/06/2016 15  10.4 - 31.8 % Final  . UIBC 07/06/2016 146  ug/dL Final   Performed at [facility], [place] [address], [place], [place] [address]  . Ferritin 07/06/2016 497* 11 - 307 ng/mL Final   Performed at [facility], [place] [address], [place], [place] [address]  .  Vitamin B-12 07/06/2016 876  180 - 914 pg/mL Final   Comment: (NOTE) This assay is not validated for testing neonatal or myeloproliferative syndrome specimens for Vitamin B12 levels. Performed at [facility], [place] [address], [place], [place] [address]   . Folate 07/06/2016 11.5  >5.9 ng/mL Final   Performed at [facility] [address], [place], [place] [address]  . Blood Bank Specimen 07/06/2016 SAMPLE AVAILABLE FOR TESTING   Final  . Sample Expiration 07/06/2016 07/09/2016   Final  There may be more visits with results that are not included.      Imaging: No results found.  Speciality Comments: No specialty comments available.    Procedures:  No procedures performed Allergies: Patient has no known allergies.   Assessment / Plan:     Visit Diagnoses: Rheumatoid arthritis involving multiple sites with positive rheumatoid factor (HCC)  Idiopathic chronic gout of multiple sites without tophus - Plan: Uric acid  High risk medication use - Plan: CBC with Differential/Platelet, COMPLETE METABOLIC PANEL WITH GFR  Chronic kidney disease (CKD) stage G3b/A1  High risk medication use - Hydroxychloroquine,MTX d/cd due to low GFR - Plan: CBC with Differential/Platelet, COMPLETE METABOLIC PANEL WITH GFR    Plan: #1: History of arthritis with positive rheumatoid factor. Doing well with the joints. She only has synovial thickening of bilateral third MCP. Note joint pain, swelling, stiffness. Adequate control with Plaquenil 200 mg daily and Arava 10 mg every other day. No signs of anemia with a history of taking the Deputy.  #2: High risk prescription. Plaquenil 200 mg daily Arava 10 mg every other day Adequate response Labs reviewed from 10/08/2016 shows normal CBC with differential and CMP with GFR except low potassium. Patient recently had a GI bleed that caused her to have admission to [facility]. They stopped her from taking Klor-Con and other medications. Please see Epic for full details  #3: Gout. No flare. Taking allopurinol 100 mg every day.  #4: Status post GI bleed address by Dr. Laural Golden, gastroenterologist, [place], [facility]. Please see Epic for full note. Dr. Laural Golden wanted to refer the patient to [facility]. That referral has not come through yet and Mr. [name] contacted [facility] to coordinate this. Dr. Laural Golden wants the patient to have a procedure.  #5: Due to the GI bleed, patient was taken off of multiple medications. For some time she was taken off of  Lasix. Due to the significant swelling in her lower extremities and her inability to walk, they had to restart the Lasix. Patient has not followed up with her PCP yet and I recommended that they call Dr. Thayer Ohm today to make an appointment either for tomorrow or Monday at the latest. If the patient can be seen today that would be best.  #6: Plan: Continue Plaquenil, Arava, allopurinol (okay to hold off on colchicine  #7: Follow-up with Dr. Legrand Rams & follow with [facility].  #8: Return to clinic in 3 months  Orders: No orders of the defined types were placed in this encounter.  No orders of the defined types were placed in this encounter.   Face-to-face time spent with patient was 40 minutes. 50% of time was spent in counseling and coordination of care.  Follow-Up Instructions: No Follow-up on file.   Eliezer Lofts, PA-C  Note - This record has been created using [employer].  Chart creation errors have been sought, but may not always  have been located. Such creation errors do not reflect on  the standard of medical care.

## 2016-10-15 ENCOUNTER — Ambulatory Visit (INDEPENDENT_AMBULATORY_CARE_PROVIDER_SITE_OTHER): Payer: Medicare HMO | Admitting: Adult Health

## 2016-10-15 ENCOUNTER — Telehealth: Payer: Self-pay

## 2016-10-15 ENCOUNTER — Encounter (HOSPITAL_COMMUNITY): Payer: Self-pay | Admitting: Emergency Medicine

## 2016-10-15 ENCOUNTER — Encounter: Payer: Self-pay | Admitting: Adult Health

## 2016-10-15 ENCOUNTER — Other Ambulatory Visit (HOSPITAL_COMMUNITY)
Admission: RE | Admit: 2016-10-15 | Discharge: 2016-10-15 | Disposition: A | Discharge status: Home / Self Care | Payer: Medicare HMO | Source: Ambulatory Visit | Attending: Adult Health | Admitting: Adult Health

## 2016-10-15 ENCOUNTER — Emergency Department (HOSPITAL_COMMUNITY)
Admission: EM | Admit: 2016-10-15 | Discharge: 2016-10-15 | Disposition: A | Discharge status: Home / Self Care | Payer: Medicare HMO | Attending: Emergency Medicine | Admitting: Emergency Medicine

## 2016-10-15 VITALS — BP 126/58 | HR 95 | Ht 65.0 in | Wt 144.0 lb

## 2016-10-15 DIAGNOSIS — I130 Hypertensive heart and chronic kidney disease with heart failure and stage 1 through stage 4 chronic kidney disease, or unspecified chronic kidney disease: Secondary | ICD-10-CM | POA: Diagnosis not present

## 2016-10-15 DIAGNOSIS — Z853 Personal history of malignant neoplasm of breast: Secondary | ICD-10-CM | POA: Insufficient documentation

## 2016-10-15 DIAGNOSIS — I5022 Chronic systolic (congestive) heart failure: Secondary | ICD-10-CM | POA: Diagnosis not present

## 2016-10-15 DIAGNOSIS — R799 Abnormal finding of blood chemistry, unspecified: Secondary | ICD-10-CM | POA: Diagnosis present

## 2016-10-15 DIAGNOSIS — N183 Chronic kidney disease, stage 3 (moderate): Secondary | ICD-10-CM | POA: Insufficient documentation

## 2016-10-15 DIAGNOSIS — F17220 Nicotine dependence, chewing tobacco, uncomplicated: Secondary | ICD-10-CM | POA: Diagnosis not present

## 2016-10-15 DIAGNOSIS — K219 Gastro-esophageal reflux disease without esophagitis: Secondary | ICD-10-CM | POA: Diagnosis not present

## 2016-10-15 DIAGNOSIS — I509 Heart failure, unspecified: Secondary | ICD-10-CM | POA: Diagnosis not present

## 2016-10-15 DIAGNOSIS — E876 Hypokalemia: Secondary | ICD-10-CM | POA: Insufficient documentation

## 2016-10-15 DIAGNOSIS — I10 Essential (primary) hypertension: Secondary | ICD-10-CM

## 2016-10-15 DIAGNOSIS — Z95810 Presence of automatic (implantable) cardiac defibrillator: Secondary | ICD-10-CM | POA: Diagnosis not present

## 2016-10-15 LAB — BASIC METABOLIC PANEL
ANION GAP: 10 (ref 5–15)
BUN: 18 mg/dL (ref 6–20)
CO2: 27 mmol/L (ref 22–32)
Calcium: 8.3 mg/dL — ABNORMAL LOW (ref 8.9–10.3)
Chloride: 99 mmol/L — ABNORMAL LOW (ref 101–111)
Creatinine, Ser: 0.99 mg/dL (ref 0.44–1.00)
GFR calc Af Amer: >60 mL/min (ref >60)
GFR calc non Af Amer: 53 mL/min — ABNORMAL LOW (ref >60)
GLUCOSE: 108 mg/dL — AB (ref 65–99)
POTASSIUM: 3 mmol/L — AB (ref 3.5–5.1)
Sodium: 136 mmol/L (ref 135–145)

## 2016-10-15 LAB — MAGNESIUM: Magnesium: 1.6 mg/dL — ABNORMAL LOW (ref 1.7–2.4)

## 2016-10-15 MED ORDER — POTASSIUM CHLORIDE 10 MEQ/100ML IV SOLN
10.0000 meq | Freq: Once | INTRAVENOUS | Status: AC
  Administered 2016-10-15: 10 meq via INTRAVENOUS
  Filled 2016-10-15: qty 100

## 2016-10-15 MED ORDER — FUROSEMIDE 40 MG PO TABS: ORAL_TABLET | ORAL | 3 refills | Status: DC

## 2016-10-15 MED ORDER — MAGNESIUM SULFATE IN D5W 1-5 GM/100ML-% IV SOLN
1.0000 g | Freq: Once | INTRAVENOUS | Status: AC
Start: 2016-10-15 — End: 2016-10-15
  Administered 2016-10-15: 1 g via INTRAVENOUS
  Filled 2016-10-15: qty 100

## 2016-10-15 MED ORDER — MAGNESIUM 400 MG PO CAPS: 400.0000 mg | ORAL_CAPSULE | Freq: Two times a day (BID) | ORAL | 6 refills | Status: DC

## 2016-10-15 MED ORDER — POTASSIUM CHLORIDE CRYS ER 20 MEQ PO TBCR: 40.0000 meq | EXTENDED_RELEASE_TABLET | Freq: Every day | ORAL | 3 refills | Status: DC

## 2016-10-15 MED ORDER — FUROSEMIDE 40 MG PO TABS: 40.0000 mg | ORAL_TABLET | Freq: Every day | ORAL | 3 refills | Status: DC

## 2016-10-15 MED ORDER — POTASSIUM CHLORIDE CRYS ER 20 MEQ PO TBCR
40.0000 meq | EXTENDED_RELEASE_TABLET | Freq: Once | ORAL | Status: AC
  Administered 2016-10-15: 40 meq via ORAL
  Filled 2016-10-15: qty 2

## 2016-10-15 NOTE — ED Provider Notes (Signed)
[facility] DEPT Provider Note   CSN: [id number] Arrival date & time: 10/15/16  1700     History   Chief Complaint No chief complaint on file.   HPI [name] is a 76 y.o. female.  HPI Patient was sent to the emergency room because of abnormal laboratory tests.  Patient had outpatient lab tests today. She had a basic metabolic panel that showed a potassium of 3. She had a magnesium level of 1.6 (normal 1.7-2.4).  Patient had prescriptions called in by NP [facility].  Patient was instructed to come to the emergency room for infusions of magnesium and potassium. Patient denies any complaints. She is not having any chest pain or shortness of breath. No vomiting or diarrhea.  Past Medical History:  Diagnosis Date  . Anemia    Hgb of 9-10  . Anemia of chronic disease    Hgb of 9-10 chronically; 06/2010: H&H-10.7/33.5, MCV-81, normal iron studies in 2010   . Arteriosclerotic cardiovascular disease (ASCVD)    Remote PTCA by patient report; LBBB; associated cardiomyopathy, presumed ischemic with EF 40-45% previously, 20% in 06/2009; h/o clinical congestive heart failure; negative stress nuclear in 2009 with inferoseptal and apical scar  . Automatic implantable cardioverter-defibrillator in situ   . Breast cancer ([place])    breast  . Chronic bronchitis ([place])   . Chronic renal disease, stage 3, moderately decreased glomerular filtration rate (GFR) between 30-59 mL/min/1.73 square meter 02/01/2016  . Congestive heart failure (CHF) ([place])   . Elevated cholesterol   . Elevated sed rate   . GERD (gastroesophageal reflux disease)   . Gout   . HOH (hard of hearing)   . Hyperlipidemia   . Hypertension   . Rheumatoid arthritis [facility])        Past Surgical History:  Procedure Laterality Date  . BI-VENTRICULAR IMPLANTABLE CARDIOVERTER DEFIBRILLATOR N/A 07/28/2012   Procedure: BI-VENTRICULAR IMPLANTABLE CARDIOVERTER DEFIBRILLATOR  (CRT-D);  Surgeon: Evans Lance, MD;  Location: [facility] CATH LAB;   Service: Cardiovascular;  Laterality: N/A;  . BI-VENTRICULAR IMPLANTABLE CARDIOVERTER DEFIBRILLATOR  (CRT-D)  07/28/2012  . BREAST BIOPSY Bilateral   . CATARACT EXTRACTION W/ INTRAOCULAR LENS IMPLANT Left   . CATARACT EXTRACTION W/PHACO Right 09/15/2013   Procedure: CATARACT EXTRACTION PHACO AND INTRAOCULAR LENS PLACEMENT (IOC);  Surgeon: Elta Guadeloupe T. Gershon Crane, MD;  Location: [facility] ORS;  Service: Ophthalmology;  Laterality: Right;  CDE:  10.74  . COLONOSCOPY N/A 03/04/2015   Procedure: COLONOSCOPY;  Surgeon: Rogene Houston, MD;  Location: [facility] ENDO SUITE;  Service: Endoscopy;  Laterality: N/A;  10:50   . ESOPHAGOGASTRODUODENOSCOPY N/A 03/04/2015   Procedure: ESOPHAGOGASTRODUODENOSCOPY (EGD);  Surgeon: Rogene Houston, MD;  Location: [facility] ENDO SUITE;  Service: Endoscopy;  Laterality: N/A;  . ESOPHAGOGASTRODUODENOSCOPY N/A 09/21/2016   Procedure: ESOPHAGOGASTRODUODENOSCOPY (EGD);  Surgeon: Rogene Houston, MD;  Location: [facility] ENDO SUITE;  Service: Endoscopy;  Laterality: N/A;  . GIVENS CAPSULE STUDY N/A 09/22/2016   Procedure: GIVENS CAPSULE STUDY;  Surgeon: Rogene Houston, MD;  Location: [facility] ENDO SUITE;  Service: Endoscopy;  Laterality: N/A;  . MASTECTOMY Left 1998  . TOTAL KNEE ARTHROPLASTY Right    Dr.Harrison  . TUBAL LIGATION      OB History    Gravida Para Term Preterm AB Living   1 1 1      0   SAB TAB Ectopic Multiple Live Births                   Home Medications    Prior  to Admission medications   Medication Sig Start Date End Date Taking? Authorizing Provider  acetaminophen (TYLENOL) 500 MG tablet Take 1,000 mg by mouth every 6 (six) hours as needed.     [provider]  allopurinol (ZYLOPRIM) 100 MG tablet Take 1 tablet (100 mg total) by mouth daily. 08/21/16 11/19/16  Panwala, Naitik, PA-C  carvedilol (COREG) 6.25 MG tablet TAKE 1 TABLET BY MOUTH TWICE A DAY WITH MEALS 06/07/15   Lendon Colonel, NP  folic acid (FOLVITE) 1 MG tablet Take 1 mg by mouth daily.    [provider]  furosemide (LASIX) 40 MG tablet Take 40 mg am and take 20 mg (1/2 tablet) in pm 10/15/16   Lendon Colonel, NP  hydroxychloroquine (PLAQUENIL) 200 MG tablet Take 1 tablet (200 mg total) by mouth daily. Monday-Friday 09/14/16   Panwala, Naitik, PA-C  lisinopril (PRINIVIL,ZESTRIL) 20 MG tablet Take 10 mg by mouth daily.  04/27/16   [provider]  lovastatin (MEVACOR) 40 MG tablet TAKE 1 TABLET (40 MG TOTAL) BY MOUTH AT BEDTIME. 08/19/15   Lendon Colonel, NP  Magnesium 400 MG CAPS Take 400 mg by mouth 2 (two) times daily. 10/15/16   Lendon Colonel, NP  pantoprazole (PROTONIX) 40 MG tablet Take 1 tablet (40 mg total) by mouth daily. 09/25/16   Isaac Bliss, Rayford Halsted, MD  potassium chloride SA (K-DUR,KLOR-CON) 20 MEQ tablet Take 2 tablets (40 mEq total) by mouth daily. 10/15/16   Lendon Colonel, NP    Family History Family History  Problem Relation Age of Onset  . Diabetes Father   . Pancreatic cancer Father   . Hypertension Brother     Social History Social History  Substance Use Topics  . Smoking status: Former Smoker    Packs/day: 0.25    Years: 30.00    Types: Cigarettes    Start date: 09/17/1956    Quit date: 04/24/2007  . Smokeless tobacco: Current User    Types: Chew     Comment: 07/03/2013 "smoked some; don't know how much or how long or when I quit"  . Alcohol use No     Allergies   Patient has no known allergies.   Review of Systems Review of Systems  All other systems reviewed and are negative.    Physical Exam Updated Vital Signs BP 138/60 (BP Location: Right Arm)   Pulse 81   Temp 99 F (37.2 C) (Oral)   Resp (!) 22   Ht 1.651 m (5\' 5" )   Wt 65.3 kg (144 lb)   SpO2 97%   BMI 23.96 kg/m   Physical Exam  Constitutional: No distress.  HENT:  Head: Normocephalic and atraumatic.  Right Ear: External ear normal.  Left Ear: External ear normal.  Eyes: Conjunctivae are normal. Right eye exhibits no discharge. Left eye  exhibits no discharge. No scleral icterus.  Neck: Neck supple. No tracheal deviation present.  Cardiovascular: Normal rate, regular rhythm and intact distal pulses.   Pulmonary/Chest: Effort normal and breath sounds normal. No stridor. No respiratory distress. She has no wheezes. She has no rales.  Abdominal: Soft. Bowel sounds are normal. She exhibits no distension. There is no tenderness. There is no rebound and no guarding.  Musculoskeletal: She exhibits edema (mild bilateral lower extremities). She exhibits no tenderness.  Neurological: She is alert. She has normal strength. No cranial nerve deficit (no facial droop, extraocular movements intact, no slurred speech) or sensory deficit. She exhibits normal muscle tone.  She displays no seizure activity. Coordination normal.  Skin: Skin is warm and dry. No rash noted. She is not diaphoretic.  Psychiatric: She has a normal mood and affect.  Nursing note and vitals reviewed.    ED Treatments / Results  Labs (all labs ordered are listed, but only abnormal results are displayed) Labs Reviewed - No data to display    Radiology No results found.  Procedures Procedures (including critical care time)  Medications Ordered in ED Medications  magnesium sulfate IVPB 1 g 100 mL (not administered)  potassium chloride 10 mEq in 100 mL IVPB (not administered)  potassium chloride SA (K-DUR,KLOR-CON) CR tablet 40 mEq (not administered)     Initial Impression / Assessment and Plan / ED Course  I have reviewed the triage vital signs and the nursing notes.  Pertinent labs & imaging results that were available during my care of the patient were reviewed by me and considered in my medical decision making (see chart for details).   patient presented to the emergency room for IV infusion of potassium and magnesium.  She appears hemodynamically stable. She is not having any vomiting or diarrhea. I have ordered a gram of magnesium as well as 10 mEq  potassium IV. I have also ordered 40 mEq of potassium orally.  7:43 PM Patient tolerated her infusions without difficulty. She is ready for discharge  Final Clinical Impressions(s) / ED Diagnoses   Final diagnoses:  Hypomagnesemia  Hypokalemia    New Prescriptions New Prescriptions   No medications on file     Dorie Rank, MD 10/15/16 1943

## 2016-10-15 NOTE — Telephone Encounter (Signed)
I spoke with son [name] [phone] and explained the need for his mother to start magnesium and potassium ASAP.He will have his mother taken to the [facility] ED today for infusion. He also was instructed to increase her lasix from 40 mg am to add 20 mg in the pm also.   Bmet entered for 1 week and mailed to pt

## 2016-10-15 NOTE — ED Triage Notes (Signed)
Patient states she had blood drawn by PCP today and was called and told to come to ER for potassium and magnesium infusion. Patient denies symptoms at this time.

## 2016-10-15 NOTE — Progress Notes (Signed)
Cardiology Office Note   Date:  10/15/2016   ID:  [name], DOB 08-Oct-1940, MRN [id number]  PCP:  [name], MD  Cardiologist:  [name]  Chief Complaint  Patient presents with  . Hospitalization Follow-up  . Congestive Heart Failure      History of Present Illness: [name] is a 76 y.o. female who presents for ongoing assessment and management of chronic systolic heart failure, with a history of coronary artery disease, ICD in situ (this is a [employer] device), chronic left bundle branch block, hypertension, with other history to include iron deficiency anemia. She was last in the office on 10/27/2015. At that time she had multiple complaints of dizziness and weakness. She was hypotensive. I decreased lisinopril to 10 mg daily, she was told to take Benadryl when necessary itching.  On 10/05/2016, the patient called her office complaining of lower extremity edema and fluid retention. ICD interrogation revealed thoracic impedance abnormal suggesting fluid accumulation on 09/19/2016. He correlated with*hospitalization for GI bleed. Review of the notes revealed that the patient was discontinued on diuretics during that hospitalization and not restarted on her discharge. She was advised to take Lasix 40 mg twice a day for 3 days, then 40 mg daily thereafter. BMET was to be checked on 10/08/2016 with follow-up office visit today.  A follow-up ICD interrogation was on 1619 2018 with improvement in thoracic impedance however continue to be abnormal after increased dose of Lasix. She is prescribed potassium 10 mEq daily. At that office visit, her weight was 149 pounds, when she saw rheumatologist.  She comes today confused about her medications. She states she is taking her fluid filled because her legs were so swollen. She is now taking one a day although this is not listed on her medication regimen. She is uncertain if she is taking potassium. She states that she is feeling better  since starting back on fluid medicine but is not completely had resolution of lower extremity edema. She also describes a "cold in her chest" and has been coughing some. She denies fever or chills.  : Sodium 140, potassium 3.1, chloride 103, CO2 26, glucose 113, BUN 16, creatinine 0.95. Past Medical History:  Diagnosis Date  . Anemia    Hgb of 9-10  . Anemia of chronic disease    Hgb of 9-10 chronically; 06/2010: H&H-10.7/33.5, MCV-81, normal iron studies in 2010   . Arteriosclerotic cardiovascular disease (ASCVD)    Remote PTCA by patient report; LBBB; associated cardiomyopathy, presumed ischemic with EF 40-45% previously, 20% in 06/2009; h/o clinical congestive heart failure; negative stress nuclear in 2009 with inferoseptal and apical scar  . Automatic implantable cardioverter-defibrillator in situ   . Breast cancer ([place])    breast  . Chronic bronchitis ([place])   . Chronic renal disease, stage 3, moderately decreased glomerular filtration rate (GFR) between 30-59 mL/min/1.73 square meter 02/01/2016  . Congestive heart failure (CHF) ([place])   . Elevated cholesterol   . Elevated sed rate   . GERD (gastroesophageal reflux disease)   . Gout   . HOH (hard of hearing)   . Hyperlipidemia   . Hypertension   . Rheumatoid arthritis [facility])     Past Surgical History:  Procedure Laterality Date  . BI-VENTRICULAR IMPLANTABLE CARDIOVERTER DEFIBRILLATOR N/A 07/28/2012   Procedure: BI-VENTRICULAR IMPLANTABLE CARDIOVERTER DEFIBRILLATOR  (CRT-D);  Surgeon: Evans Lance, MD;  Location: [facility] CATH LAB;  Service: Cardiovascular;  Laterality: N/A;  . BI-VENTRICULAR IMPLANTABLE CARDIOVERTER DEFIBRILLATOR  (CRT-D)  07/28/2012  .  BREAST BIOPSY Bilateral   . CATARACT EXTRACTION W/ INTRAOCULAR LENS IMPLANT Left   . CATARACT EXTRACTION W/PHACO Right 09/15/2013   Procedure: CATARACT EXTRACTION PHACO AND INTRAOCULAR LENS PLACEMENT (IOC);  Surgeon: Elta Guadeloupe T. Gershon Crane, MD;  Location: [facility] ORS;  Service: Ophthalmology;   Laterality: Right;  CDE:  10.74  . COLONOSCOPY N/A 03/04/2015   Procedure: COLONOSCOPY;  Surgeon: Rogene Houston, MD;  Location: [facility] ENDO SUITE;  Service: Endoscopy;  Laterality: N/A;  10:50   . ESOPHAGOGASTRODUODENOSCOPY N/A 03/04/2015   Procedure: ESOPHAGOGASTRODUODENOSCOPY (EGD);  Surgeon: Rogene Houston, MD;  Location: [facility] ENDO SUITE;  Service: Endoscopy;  Laterality: N/A;  . ESOPHAGOGASTRODUODENOSCOPY N/A 09/21/2016   Procedure: ESOPHAGOGASTRODUODENOSCOPY (EGD);  Surgeon: Rogene Houston, MD;  Location: [facility] ENDO SUITE;  Service: Endoscopy;  Laterality: N/A;  . GIVENS CAPSULE STUDY N/A 09/22/2016   Procedure: GIVENS CAPSULE STUDY;  Surgeon: Rogene Houston, MD;  Location: [facility] ENDO SUITE;  Service: Endoscopy;  Laterality: N/A;  . MASTECTOMY Left 1998  . TOTAL KNEE ARTHROPLASTY Right    Dr.Harrison  . TUBAL LIGATION       Current Outpatient Prescriptions  Medication Sig Dispense Refill  . acetaminophen (TYLENOL) 500 MG tablet Take 1,000 mg by mouth every 6 (six) hours as needed.     [name] allopurinol (ZYLOPRIM) 100 MG tablet Take 1 tablet (100 mg total) by mouth daily. 90 tablet 0  . carvedilol (COREG) 6.25 MG tablet TAKE 1 TABLET BY MOUTH TWICE A DAY WITH MEALS 60 tablet 6  . folic acid (FOLVITE) 1 MG tablet Take 1 mg by mouth daily.    . hydroxychloroquine (PLAQUENIL) 200 MG tablet Take 1 tablet (200 mg total) by mouth daily. Monday-Friday 90 tablet 0  . lisinopril (PRINIVIL,ZESTRIL) 20 MG tablet Take 10 mg by mouth daily.     [name] lovastatin (MEVACOR) 40 MG tablet TAKE 1 TABLET (40 MG TOTAL) BY MOUTH AT BEDTIME. 30 tablet 6  . pantoprazole (PROTONIX) 40 MG tablet Take 1 tablet (40 mg total) by mouth daily. 30 tablet 2  . furosemide (LASIX) 40 MG tablet Take 1 tablet (40 mg total) by mouth daily. 90 tablet 3   No current facility-administered medications for this visit.     Allergies:   Patient has no known allergies.    Social History:  The patient  reports that she quit smoking about 9  years ago. Her smoking use included Cigarettes. She started smoking about 60 years ago. She has a 7.50 pack-year smoking history. Her smokeless tobacco use includes Chew. She reports that she does not drink alcohol or use drugs.   Family History:  The patient's family history includes Diabetes in her father; Hypertension in her brother; Pancreatic cancer in her father.    ROS: All other systems are reviewed and negative. Unless otherwise mentioned in H&P    PHYSICAL EXAM: VS:  BP (!) 126/58   Pulse 95   Ht 5\' 5"  (1.651 m)   Wt 144 lb (65.3 kg)   SpO2 97%   BMI 23.96 kg/m  , BMI Body mass index is 23.96 kg/m. GEN: Well nourished, well developed, in no acute distress  HEENT: normal  Neck: no JVD, carotid bruits, or masses Cardiac: RRR; distant heart sounds, 1/6 systolic murmur. no rubs, or gallops,no edema  Respiratory:  Some bilateral crackles, no wheezes or rhonchi.  GI: soft, nontender, nondistended, + BS MS: no deformity or atrophy  2+ pretibial edema, sore to touch. Several healed wounds along her legs bilaterally.  Skin: warm and dry, no rash Neuro:  Strength and sensation are intact. Hard of hearing.  Psych: euthymic mood, full affect  Recent Labs: 09/20/2016: ALT 22 09/25/2016: Platelets 222 10/05/2016: Hemoglobin 9.2 10/08/2016: BUN 16; Creatinine, Ser 0.95; Potassium 3.1; Sodium 140    Lipid Panel    Component Value Date/Time   CHOL 134 05/23/2012 0905   TRIG 103 05/23/2012 0905   HDL 30 (L) 05/23/2012 0905   CHOLHDL 4.5 05/23/2012 0905   VLDL 21 05/23/2012 0905   LDLCALC 83 05/23/2012 0905      Wt Readings from Last 3 Encounters:  10/15/16 144 lb (65.3 kg)  10/11/16 149 lb (67.6 kg)  09/25/16 147 lb 7.8 oz (66.9 kg)      Other studies Reviewed:  Echocardiogram 07/01/2015 Left ventricle: The cavity size was normal. Wall thickness was   increased in a pattern of mild LVH. Systolic function was   moderately to severely reduced. The estimated ejection  fraction   was in the range of 30% to 35%. Diffuse hypokinesis. Doppler   parameters are consistent with abnormal left ventricular   relaxation (grade 1 diastolic dysfunction). Doppler parameters   are consistent with high ventricular filling pressure. - Regional wall motion abnormality: Akinesis of the basal inferior   myocardium. - Aortic valve: Mildly to moderately calcified annulus. Trileaflet. - Mitral valve: There was mild regurgitation. - Left atrium: The atrium was mildly dilated. - Right ventricle: Pacer wire or catheter noted in right ventricle. - Right atrium: Pacer wire or catheter noted in right atrium. - Tricuspid valve: There was mild regurgitation. - Pulmonary arteries: PA peak pressure: 27 mm Hg (S).  ASSESSMENT AND PLAN:  1.  Acute on chronic mixed CHF: Diuretics apparently were held on discharge, she began to regain fluid with significant lower extremity edema and shortness of breath. Solu-Medrol rheumatologist who placed her back on Lasix 40 mg twice a day for 3 days and then Lasix 40 mg daily. She is uncertain if she is placed back on potassium or not. Her nephew provides her medication to her and a free filled medication dispenser. The patient still complains of lower extremity edema, but she reports that this is much better than when she saw her rheumatologist last week. She did use the bathroom a good bit and is feeling some better but still has a little bit of lung congestion and edema.  2. Hypertension: Currently well-controlled. I'm going to check a BMET and a magnesium today for follow-up on kidney status. We'll not make any other medication changes at this time. Will stay on lisinopril and carvedilol for now.  3. GERD: Patient is on pantoprazole. Check a magnesium status   Current medicines are reviewed at length with the patient today.    Labs/ tests ordered today include: BMET magnesium   Phill Myron. West Pugh, ANP, AACC   10/15/2016 2:37 PM    [facility] [address], [place], [place] [address] Phone: [phone]; Fax: [phone]

## 2016-10-15 NOTE — Progress Notes (Signed)
Patient was seen by Jory Sims, NP today and have been advised of any recommendations needed.

## 2016-10-15 NOTE — Telephone Encounter (Signed)
Referral and notes have been faxed again to [facility]

## 2016-10-15 NOTE — Telephone Encounter (Signed)
-----   Message from Lendon Colonel, NP sent at 10/15/2016  3:51 PM EDT ----- Please place Mrs. Braman on potassium 40 mEq daily. Begin magnesium 400 mg BID. If she cannot start this right away, she will need to come to ER for IV infusion of potassium and magnesium as these levels are very low. She will need follow up BMET in one week after starting these medications. Stay on lasix 40 mg in the am, and add 20 mg in pm. Will need to call her nephew, as he handles her medications.

## 2016-10-15 NOTE — Progress Notes (Signed)
Consider switching to torsemide 20 mg bid with BMET 2 days after commencement.

## 2016-10-15 NOTE — Discharge Instructions (Signed)
Start taking the medications prescribed by her doctor, follow-up with them as scheduled

## 2016-10-15 NOTE — Patient Instructions (Signed)
Your physician recommends that you schedule a follow-up appointment in: 1 month with Dr Purcell Nails    Get lab work TODAY:  BMET,magnesium    We will call you with your lab results        Thank you for choosing [facility] !

## 2016-10-18 ENCOUNTER — Ambulatory Visit (INDEPENDENT_AMBULATORY_CARE_PROVIDER_SITE_OTHER): Payer: Self-pay

## 2016-10-18 ENCOUNTER — Telehealth: Payer: Self-pay | Admitting: Cardiology

## 2016-10-18 DIAGNOSIS — I5022 Chronic systolic (congestive) heart failure: Secondary | ICD-10-CM

## 2016-10-18 DIAGNOSIS — Z95810 Presence of automatic (implantable) cardiac defibrillator: Secondary | ICD-10-CM

## 2016-10-18 NOTE — Telephone Encounter (Signed)
LMOVM reminding pt to send remote transmission.   

## 2016-10-18 NOTE — Progress Notes (Signed)
EPIC Encounter for ICM Monitoring  Patient Name: [name] is a 76 y.o. female Date: 10/18/2016 Primary Care Physican: [name], MD Primary Cardiologist:Koneswaran/Lawrence NP Electrophysiologist: Lovena Le Dry Weight:unknown Bi-V Pacing: 92.4%                                                  Attempted call to nephew [name] and unable to reach.  Left message to return call.  Transmission reviewed.    Thoracic impedance remains abnormal suggesting fluid accumulation.  Prescribed dosage: Prescribed dosage: Furosemide 40 mg 1 tablet (40 mg total) every AM and 0.5 tablet (20 mg total) every PM.  Klor Con 20 mEq 2 tablets (40 mEq) daily.  Labs: 10/08/2016 Creatinine 0.95, BUN 16, Potassium 3.1, Sodium 140, EGFR 56->60 09/23/2016 Creatinine 0.74, BUN 13, Potassium 4.0, Sodium 141, EGFR >60 09/22/2016 Creatinine 0.90, BUN 15, Potassium 3.9, Sodium 141, EGFR >60  09/21/2016 Creatinine 1.05, BUN 26, Potassium 3.7, Sodium 140, EGFR 50-57  09/20/2016 Creatinine 1.53, BUN 37, Potassium 3.9, Sodium 140, EGFR 31-36 09/05/2016 Creatinine 1.25, BUN 29, Potassium 4.0, Sodium 137, EGFR 40-47  08/08/2016 Creatinine 1.10, BUN 24, Potassium 3.6, Sodium 136, EGFR 47-55 07/19/2016 Creatinine 1.26, BUN 29, Potassium 3.6, Sodium 139, EGFR 40-46 06/22/2016 Creatinine 1.44, BUN 37, Potassium 3.7, Sodium 140, EGFR 34-39 06/01/2016 Creatinine 1.68, BUN 43, Potassium 4.7, Sodium 132, EGFR 28-33 01/31/2016 Creatinine 1.31, BUN 34, Potassium 4.5, Sodium 137 GFR 38-44 01/11/2016 Creatinine 1.48, BUN 38, Potassium 4.1, Sodium 135 GFR 33-38 12/20/2015 Creatinine 1.77, BUN 40, Potassium 4.0, Sodium 140, GFR 27-31 12/02/2015 Creatinine 1.38, BUN 28, Potassium 4.0, Sodium 136, GFR 36-42 11/29/2015 Creatinine 1.68, BUN 31, Potassium 4.1, Sodium 137, GFR 28-33 11/02/2015 Creatinine 1.73, BUN 41, Potassium 4.5, Sodium 137, GFR 27-32 10/21/2015 Creatinine 2.01, BUN 35, Potassium 5.0, Sodium 136, GFR  23-26 10/12/2015 Creatinine 1.61, BUN 30, Potassium 4.0, Sodium 138, GFR 30-34 10/06/2015 Creatinine 1.45, BUN 33, Potassium 3.7, Sodium 138, GFR 34-39 09/30/2015 Creatinine 1.55, BUN 37, Potassium 4.2, Sodium 139, GFR 31-36 09/21/2015 Creatinine 1.72, BUN 49, Potassium 3.7, Sodium 138, GFR 27-32   Recommendations: NONE - Unable to reach patient   Follow-up plan: ICM clinic phone appointment on 10/25/2016.  Office appointment scheduled 11/15/2016 with [name], PA.  Copy of ICM check sent to Dr [name], [name], [place] and Dr Lovena Le.     3 month ICM trend: 10/18/2016   1 Year ICM trend:      [name], RN 10/18/2016 4:13 PM

## 2016-10-19 NOTE — Progress Notes (Signed)
Attempted call to nephew [name] requesting a call back with update on patient.  Provided ICM number for return call.

## 2016-10-19 NOTE — Progress Notes (Signed)
Reviewed. Just saw patient and increased the diuretic dose. Will be seeing her again on follow up.

## 2016-10-19 NOTE — Progress Notes (Signed)
Received call back from [place], nephew.  Patient has some swelling from knees down in both legs but has improved a lot. Breathing is fine.  Confirmed she is taking the latest dose of Furosemide 40 mg in AM and 20 mg PM.  Will recheck fluid levels 10/25/2016.  No changes today.

## 2016-10-23 ENCOUNTER — Other Ambulatory Visit (HOSPITAL_COMMUNITY)
Admission: RE | Admit: 2016-10-23 | Discharge: 2016-10-23 | Disposition: A | Discharge status: Home / Self Care | Payer: Medicare HMO | Source: Ambulatory Visit | Attending: Adult Health | Admitting: Adult Health

## 2016-10-23 DIAGNOSIS — E876 Hypokalemia: Secondary | ICD-10-CM | POA: Diagnosis not present

## 2016-10-23 LAB — BASIC METABOLIC PANEL
ANION GAP: 12 (ref 5–15)
BUN: 31 mg/dL — ABNORMAL HIGH (ref 6–20)
CHLORIDE: 99 mmol/L — AB (ref 101–111)
CO2: 25 mmol/L (ref 22–32)
Calcium: 9 mg/dL (ref 8.9–10.3)
Creatinine, Ser: 2.08 mg/dL — ABNORMAL HIGH (ref 0.44–1.00)
GFR calc non Af Amer: 22 mL/min — ABNORMAL LOW (ref >60)
GFR, EST AFRICAN AMERICAN: 25 mL/min — AB (ref >60)
GLUCOSE: 114 mg/dL — AB (ref 65–99)
POTASSIUM: 5.3 mmol/L — AB (ref 3.5–5.1)
Sodium: 136 mmol/L (ref 135–145)

## 2016-10-25 ENCOUNTER — Ambulatory Visit (INDEPENDENT_AMBULATORY_CARE_PROVIDER_SITE_OTHER): Payer: Medicare HMO

## 2016-10-25 DIAGNOSIS — Z95810 Presence of automatic (implantable) cardiac defibrillator: Secondary | ICD-10-CM

## 2016-10-25 DIAGNOSIS — I5022 Chronic systolic (congestive) heart failure: Secondary | ICD-10-CM

## 2016-10-25 NOTE — Progress Notes (Signed)
EPIC Encounter for ICM Monitoring  Patient Name: [name] is a 76 y.o. female Date: 10/25/2016 Primary Care Physican: [name], MD Primary Cardiologist:Koneswaran/Lawrence NP Electrophysiologist: Druscilla Brownie Weight:unknown Bi-V Pacing: 93.2%  Call to nephew [name].  Patients legs have returned to normal and no swelling at this time.  He said she is feeling much better at this time.     Thoracic impedance returned to normal.    Prescribed dosage: Prescribed dosage: Furosemide 40 mg 1 tablet (40 mg total) every AM and 0.5 tablet (20 mg total) every PM. Klor Con 20 mEq 2 tablets (40 mEq) daily.  Labs: 10/23/2016 Creatinine 2.08  BUN 31, Potassium 5.3, Sodium 136, EGFR 22-25 10/15/2016 Creatinine 0.99, BUN 18, Potassium 3.0, Sodium 136, EGFR 53->60 10/08/2016 Creatinine 0.95, BUN 16, Potassium 3.1, Sodium 140, EGFR 56->60 09/23/2016 Creatinine 0.74, BUN 13, Potassium 4.0, Sodium 141, EGFR >60 09/22/2016 Creatinine 0.90, BUN 15, Potassium 3.9, Sodium 141, EGFR >60  09/21/2016 Creatinine 1.05, BUN 26, Potassium 3.7, Sodium 140, EGFR 50-57  09/20/2016 Creatinine 1.53, BUN 37, Potassium 3.9, Sodium 140, EGFR 31-36 09/05/2016 Creatinine 1.25, BUN 29, Potassium 4.0, Sodium 137, EGFR 40-47  08/08/2016 Creatinine 1.10, BUN 24, Potassium 3.6, Sodium 136, EGFR 47-55 07/19/2016 Creatinine 1.26, BUN 29, Potassium 3.6, Sodium 139, EGFR 40-46 06/22/2016 Creatinine 1.44, BUN 37, Potassium 3.7, Sodium 140, EGFR 34-39 06/01/2016 Creatinine 1.68, BUN 43, Potassium 4.7, Sodium 132, EGFR 28-33 01/31/2016 Creatinine 1.31, BUN 34, Potassium 4.5, Sodium 137 GFR 38-44 01/11/2016 Creatinine 1.48, BUN 38, Potassium 4.1, Sodium 135 GFR 33-38 12/20/2015 Creatinine 1.77, BUN 40, Potassium 4.0, Sodium 140, GFR 27-31 12/02/2015 Creatinine 1.38, BUN 28, Potassium 4.0, Sodium 136, GFR 36-42  Recommendations: No changes.  Encouraged to call if leg  swelling returns or she has other fluid symptoms.  Follow-up plan: ICM clinic phone appointment on 11/15/2016.    Copy of ICM check sent to Jory Sims PA, Dr [name] and Dr Lovena Le.   3 month ICM trend: 10/24/2016   1 Year ICM trend:      [name], RN 10/25/2016 11:21 AM

## 2016-10-29 NOTE — Progress Notes (Signed)
Thank you :)

## 2016-11-08 DIAGNOSIS — K558 Other vascular disorders of intestine: Secondary | ICD-10-CM | POA: Diagnosis not present

## 2016-11-08 DIAGNOSIS — I429 Cardiomyopathy, unspecified: Secondary | ICD-10-CM | POA: Diagnosis not present

## 2016-11-08 DIAGNOSIS — K31819 Angiodysplasia of stomach and duodenum without bleeding: Secondary | ICD-10-CM | POA: Diagnosis not present

## 2016-11-08 DIAGNOSIS — D649 Anemia, unspecified: Secondary | ICD-10-CM | POA: Diagnosis not present

## 2016-11-08 DIAGNOSIS — K5521 Angiodysplasia of colon with hemorrhage: Secondary | ICD-10-CM | POA: Diagnosis not present

## 2016-11-15 ENCOUNTER — Ambulatory Visit (INDEPENDENT_AMBULATORY_CARE_PROVIDER_SITE_OTHER): Payer: Medicare HMO | Admitting: *Deleted

## 2016-11-15 ENCOUNTER — Encounter: Payer: Self-pay | Admitting: Adult Health

## 2016-11-15 ENCOUNTER — Ambulatory Visit: Payer: Medicare HMO | Admitting: Adult Health

## 2016-11-15 DIAGNOSIS — I255 Ischemic cardiomyopathy: Secondary | ICD-10-CM

## 2016-11-15 DIAGNOSIS — Z95810 Presence of automatic (implantable) cardiac defibrillator: Secondary | ICD-10-CM

## 2016-11-15 DIAGNOSIS — I5022 Chronic systolic (congestive) heart failure: Secondary | ICD-10-CM

## 2016-11-15 DIAGNOSIS — I5042 Chronic combined systolic (congestive) and diastolic (congestive) heart failure: Secondary | ICD-10-CM | POA: Diagnosis not present

## 2016-11-15 NOTE — Progress Notes (Deleted)
Cardiology Office Note   Date:  11/15/2016   ID:  [name], DOB 30-Aug-1940, MRN [id number]  PCP:  [name], MD  Cardiologist:  Dickie La chief complaint on file.     History of Present Illness: [name] is a 76 y.o. female who presents for ongoing assessment and management of coronary artery disease, ischemic cardiomyopathy with ICD in situ (Medtronic) chronic left bundle branch block, chronic systolic heart failure, hypertension, with other history of iron deficiency anemia.  She was last seen in the office on 10/15/2016 for lower extremity edema and fluid retention. Also on that office visit she was very confused about her medications not taking them appropriately, and not understanding dosing. The patient was placed on Lasix 40 mg twice a day for 3 days and then changed to Lasix 40 mg daily, (apparently she was not placed on diuretics on discharge from recent hospitalization). The patient was to come back with her medications, consideration for [facility] referral will be made today if she continued confused on medication.    Past Medical History:  Diagnosis Date  . Anemia    Hgb of 9-10  . Anemia of chronic disease    Hgb of 9-10 chronically; 06/2010: H&H-10.7/33.5, MCV-81, normal iron studies in 2010   . Arteriosclerotic cardiovascular disease (ASCVD)    Remote PTCA by patient report; LBBB; associated cardiomyopathy, presumed ischemic with EF 40-45% previously, 20% in 06/2009; h/o clinical congestive heart failure; negative stress nuclear in 2009 with inferoseptal and apical scar  . Automatic implantable cardioverter-defibrillator in situ   . Breast cancer ([place])    breast  . Chronic bronchitis ([place])   . Chronic renal disease, stage 3, moderately decreased glomerular filtration rate (GFR) between 30-59 mL/min/1.73 square meter 02/01/2016  . Congestive heart failure (CHF) ([place])   . Elevated cholesterol   . Elevated sed rate   . GERD (gastroesophageal reflux disease)     . Gout   . HOH (hard of hearing)   . Hyperlipidemia   . Hypertension   . Rheumatoid arthritis [facility])     Past Surgical History:  Procedure Laterality Date  . BI-VENTRICULAR IMPLANTABLE CARDIOVERTER DEFIBRILLATOR N/A 07/28/2012   Procedure: BI-VENTRICULAR IMPLANTABLE CARDIOVERTER DEFIBRILLATOR  (CRT-D);  Surgeon: Evans Lance, MD;  [facility] C[facility];  Service: Cardiovascular;  Laterality: N/A;  . BI-VENTRICULAR IMPLANTABLE CARDIOVERTER DEFIBRILLATOR  (CRT-D)  07/28/2012  . BREAST BIOPSY Bilateral   . CATARACT EXTRACTION W/ INTRAOCULAR LENS IMPLANT Left   . CATARACT EXTRACTION W/PHACO Right 09/15/2013   Procedure: CATARACT EXTRACTION PHACO AND INTRAOCULAR LENS PLACEMENT (IOC);  Surgeon: Elta Guadeloupe T. Gershon Crane, MD;  Location: [facility] ORS;  Service: Ophthalmology;  Laterality: Right;  CDE:  10.74  . COLONOSCOPY N/A 03/04/2015   Procedure: COLONOSCOPY;  Surgeon: Rogene Houston, MD;  Location: [facility] ENDO SUITE;  Service: Endoscopy;  Laterality: N/A;  10:50   . ESOPHAGOGASTRODUODENOSCOPY N/A 03/04/2015   Procedure: ESOPHAGOGASTRODUODENOSCOPY (EGD);  Surgeon: Rogene Houston, MD;  Location: [facility] END[facility];  Service: Endoscopy;  Laterality: N/A;  . ESOPHAGOGASTRODUODENOSCOPY N/A 09/21/2016   Procedure: ESOPHAGOGASTRODUODENOSCOPY (EGD);  Surgeon: Rogene Houston, MD;  Location[facility] END[facility];  Service: Endoscopy;  Laterality: N/A;  . GIVENS CAPSULE STUDY N/A 09/22/2016   Procedure: GIVENS CAPSULE STUDY;  Surgeon: Rogene Houston, MD;  Location: [facility] END[facility];  Service: Endoscopy;  Laterality: N/A;  . MASTECTOMY Left 1998  . TOTAL KNEE ARTHROPLASTY Right    Dr.Harrison  . TUBAL LIGATION  Current Outpatient Prescriptions  Medication Sig Dispense Refill  . acetaminophen (TYLENOL) 500 MG tablet Take 1,000 mg by mouth every 6 (six) hours as needed.     [name] allopurinol (ZYLOPRIM) 100 MG tablet Take 1 tablet (100 mg total) by mouth daily. 90 tablet 0  . carvedilol (COREG) 6.25 MG tablet TAKE 1 TABLET BY MOUTH  TWICE A DAY WITH MEALS 60 tablet 6  . folic acid (FOLVITE) 1 MG tablet Take 1 mg by mouth daily.    . furosemide (LASIX) 40 MG tablet Take 40 mg am and take 20 mg (1/2 tablet) in pm 135 tablet 3  . hydroxychloroquine (PLAQUENIL) 200 MG tablet Take 1 tablet (200 mg total) by mouth daily. Monday-Friday 90 tablet 0  . lisinopril (PRINIVIL,ZESTRIL) 20 MG tablet Take 10 mg by mouth daily.     [name] lovastatin (MEVACOR) 40 MG tablet TAKE 1 TABLET (40 MG TOTAL) BY MOUTH AT BEDTIME. 30 tablet 6  . Magnesium 400 MG CAPS Take 400 mg by mouth 2 (two) times daily. 180 capsule 6  . pantoprazole (PROTONIX) 40 MG tablet Take 1 tablet (40 mg total) by mouth daily. 30 tablet 2  . potassium chloride SA (K-DUR,KLOR-CON) 20 MEQ tablet Take 2 tablets (40 mEq total) by mouth daily. 180 tablet 3   No current facility-administered medications for this visit.     Allergies:   Patient has no known allergies.    Social History:  The patient  reports that she quit smoking about 9 years ago. Her smoking use included Cigarettes. She started smoking about 60 years ago. She has a 7.50 pack-year smoking history. Her smokeless tobacco use includes Chew. She reports that she does not drink alcohol or use drugs.   Family History:  The patient's family history includes Diabetes in her father; Hypertension in her brother; Pancreatic cancer in her father.    ROS: All other systems are reviewed and negative. Unless otherwise mentioned in H&P    PHYSICAL EXAM: VS:  There were no vitals taken for this visit. , BMI There is no height or weight on file to calculate BMI. GEN: Well nourished, well developed, in no acute distress  HEENT: normal  Neck: no JVD, carotid bruits, or masses Cardiac: ***RRR; no murmurs, rubs, or gallops,no edema  Respiratory:  clear to auscultation bilaterally, normal work of breathing GI: soft, nontender, nondistended, + BS MS: no deformity or atrophy  Skin: warm and dry, no rash Neuro:  Strength and  sensation are intact Psych: euthymic mood, full affect   EKG:  EKG {ACTION; IS/IS [id number]} ordered today. The ekg ordered today demonstrates ***   Recent Labs: 09/20/2016: ALT 22 09/25/2016: Platelets 222 10/05/2016: Hemoglobin 9.2 10/15/2016: Magnesium 1.6 10/23/2016: BUN 31; Creatinine, Ser 2.08; Potassium 5.3; Sodium 136    Lipid Panel    Component Value Date/Time   CHOL 134 05/23/2012 0905   TRIG 103 05/23/2012 0905   HDL 30 (L) 05/23/2012 0905   CHOLHDL 4.5 05/23/2012 0905   VLDL 21 05/23/2012 0905   LDLCALC 83 05/23/2012 0905      Wt Readings from Last 3 Encounters:  10/15/16 144 lb (65.3 kg)  10/15/16 144 lb (65.3 kg)  10/11/16 149 lb (67.6 kg)      Other studies Reviewed: Echocardiogram 29-Jul-2015 Left ventricle: The cavity size was normal. Wall thickness was increased in a pattern of mild LVH. Systolic function was moderately to severely reduced. The estimated ejection fraction was in the range of 30% to 35%. Diffuse hypokinesis.  Doppler parameters are consistent with abnormal left ventricular relaxation (grade 1 diastolic dysfunction). Doppler parameters are consistent with high ventricular filling pressure. - Regional wall motion abnormality: Akinesis of the basal inferior myocardium. - Aortic valve: Mildly to moderately calcified annulus. Trileaflet. - Mitral valve: There was mild regurgitation. - Left atrium: The atrium was mildly dilated. - Right ventricle: Pacer wire or catheter noted in right ventricle. - Right atrium: Pacer wire or catheter noted in right atrium. - Tricuspid valve: There was mild regurgitation. - Pulmonary arteries: PA peak pressure: 27 mm Hg (S). ASSESSMENT AND PLAN:  1.  ***   Current medicines are reviewed at length with the patient today.    Labs/ tests ordered today include: *** Phill Myron. West Pugh, ANP, AACC   11/15/2016 7:14 AM    [facility] [address], [place],  [place] [address] Phone: [phone]; Fax: [phone]

## 2016-11-15 NOTE — Progress Notes (Signed)
Remote ICD transmission.   

## 2016-11-15 NOTE — Progress Notes (Signed)
EPIC Encounter for ICM Monitoring  Patient Name: [name] is a 76 y.o. female Date: 11/15/2016 Primary Care Physican: [name], MD Primary Cardiologist:Koneswaran/Lawrence NP Electrophysiologist: Lovena Le Dry Weight:unknown Bi-V Pacing: 93.2%             Attempted call to nephew [name] and left detailed message regarding transmission.    Thoracic impedance abnormal suggesting fluid accumulation from 7/20 until today which is almost at baseline.  Prescribed dosage: Furosemide 40 mg 1 tablet (40 mg total) every AM and 0.5 tablet (20 mg total) every PM.Klor Con 20 mEq 2tablets (40 mEq)daily.  Labs: 10/23/2016 Creatinine 2.08  BUN 31, Potassium 5.3, Sodium 136, EGFR 22-25 10/15/2016 Creatinine 0.99, BUN 18, Potassium 3.0, Sodium 136, EGFR 53->60 10/08/2016 Creatinine 0.95, BUN 16, Potassium 3.1, Sodium 140, EGFR 56->60 09/23/2016 Creatinine 0.74, BUN 13, Potassium 4.0, Sodium 141, EGFR >60 09/22/2016 Creatinine 0.90, BUN 15, Potassium 3.9, Sodium 141, EGFR >60  09/21/2016 Creatinine 1.05, BUN 26, Potassium 3.7, Sodium 140, EGFR 50-57  09/20/2016 Creatinine 1.53, BUN 37, Potassium 3.9, Sodium 140, EGFR 31-36 09/05/2016 Creatinine 1.25, BUN 29, Potassium 4.0, Sodium 137, EGFR 40-47  08/08/2016 Creatinine 1.10, BUN 24, Potassium 3.6, Sodium 136, EGFR 47-55 07/19/2016 Creatinine 1.26, BUN 29, Potassium 3.6, Sodium 139, EGFR 40-46 06/22/2016 Creatinine 1.44, BUN 37, Potassium 3.7, Sodium 140, EGFR 34-39 06/01/2016 Creatinine 1.68, BUN 43, Potassium 4.7, Sodium 132, EGFR 28-33 01/31/2016 Creatinine 1.31, BUN 34, Potassium 4.5, Sodium 137 GFR 38-44 01/11/2016 Creatinine 1.48, BUN 38, Potassium 4.1, Sodium 135 GFR 33-38 12/20/2015 Creatinine 1.77, BUN 40, Potassium 4.0, Sodium 140, GFR 27-31 12/02/2015 Creatinine 1.38, BUN 28, Potassium 4.0, Sodium 136, GFR 36-42  Recommendations: Patient being seen in office today.   Follow-up plan: ICM clinic phone appointment on  12/17/2016.  Office appointment scheduled 11/15/2016 with [name] NP.  Copy of ICM check sent to primary cardiologist and device physician.   3 month ICM trend: 11/15/2016   1 Year ICM trend:      [name], RN 11/15/2016 11:53 AM

## 2016-11-16 ENCOUNTER — Encounter: Payer: Self-pay | Admitting: Cardiology

## 2016-11-20 ENCOUNTER — Ambulatory Visit (INDEPENDENT_AMBULATORY_CARE_PROVIDER_SITE_OTHER): Payer: Medicare HMO | Admitting: Adult Health

## 2016-11-20 ENCOUNTER — Encounter: Payer: Self-pay | Admitting: Adult Health

## 2016-11-20 VITALS — BP 108/56 | HR 80 | Ht 65.0 in | Wt 140.0 lb

## 2016-11-20 DIAGNOSIS — I5022 Chronic systolic (congestive) heart failure: Secondary | ICD-10-CM | POA: Diagnosis not present

## 2016-11-20 DIAGNOSIS — K219 Gastro-esophageal reflux disease without esophagitis: Secondary | ICD-10-CM | POA: Diagnosis not present

## 2016-11-20 DIAGNOSIS — I10 Essential (primary) hypertension: Secondary | ICD-10-CM | POA: Diagnosis not present

## 2016-11-20 MED ORDER — MAGNESIUM OXIDE 400 MG PO TABS: 400.0000 mg | ORAL_TABLET | Freq: Every day | ORAL | 3 refills | Status: DC

## 2016-11-20 MED ORDER — PANTOPRAZOLE SODIUM 20 MG PO TBEC: 20.0000 mg | DELAYED_RELEASE_TABLET | Freq: Every day | ORAL | 3 refills | Status: DC

## 2016-11-20 NOTE — Progress Notes (Signed)
Cardiology Office Note   Date:  11/20/2016   ID:  CAMELLE HENKELS, DOB 1940/12/31, MRN [id number]  PCP:  [name], MD  Cardiologist:  [name]  Chief Complaint  Patient presents with  . Congestive Heart Failure  . Coronary Artery Disease      History of Present Illness: [name] is a 76 y.o. female who presents for ongoing assessment and management of chronic systolic heart failure, most recent echocardiogram 07/01/2015 revealed EF of 3035% with diffuse hypokinesis. known history of ICD in situ (Medtronic) chronic left bundle branch block, coronary artery disease, chronic iron deficiency anemia. Was last seen in the office on 10/15/2016 confused about her medications, had not been taking medications as directed.  On that last office visit the patient was continued on Lasix 40 mg daily, after taking Lasix 40 mg twice a day for 3 days prior. She continues to be seen by rheumatologist with prednisone therapy leading to fluid retention as well. BMET and magnesium were ordered. She was continued on lisinopril and PPI.  The patient had a critically low magnesium of 1.6, was seen in the emergency room for IV magnesium infusion. Follow-up phone call on 10/18/2016 from nephew who is caring for the patient's stated that her lower extremity edema had worsened, she was continued on Lasix 40 mg in the a.m. and 20 mg in the p.m.  Follow-up labs on 10/23/2016 sodium 136 potassium 5.3 chloride 99 CO2 25 glucose 114 BUN 31 creatinine 2.08.  She is here today complaining of frequent diarrhea after taking magnesium supplements. Other than that she has been doing well maintaining her weight, no evidence of fluid overload or dyspnea.  Past Medical History:  Diagnosis Date  . Anemia    Hgb of 9-10  . Anemia of chronic disease    Hgb of 9-10 chronically; 06/2010: H&H-10.7/33.5, MCV-81, normal iron studies in 2010   . Arteriosclerotic cardiovascular disease (ASCVD)    Remote PTCA by patient report;  LBBB; associated cardiomyopathy, presumed ischemic with EF 40-45% previously, 20% in 06/2009; h/o clinical congestive heart failure; negative stress nuclear in 2009 with inferoseptal and apical scar  . Automatic implantable cardioverter-defibrillator in situ   . Breast cancer ([place])    breast  . Chronic bronchitis ([place])   . Chronic renal disease, stage 3, moderately decreased glomerular filtration rate (GFR) between 30-59 mL/min/1.73 square meter 02/01/2016  . Congestive heart failure (CHF) ([place])   . Elevated cholesterol   . Elevated sed rate   . GERD (gastroesophageal reflux disease)   . Gout   . HOH (hard of hearing)   . Hyperlipidemia   . Hypertension   . Rheumatoid arthritis [facility])     Past Surgical History:  Procedure Laterality Date  . BI-VENTRICULAR IMPLANTABLE CARDIOVERTER DEFIBRILLATOR N/A 07/28/2012   Procedure: BI-VENTRICULAR IMPLANTABLE CARDIOVERTER DEFIBRILLATOR  (CRT-D);  Surgeon: Evans Lance, MD;  Location: [facility] CATH LAB;  Service: Cardiovascular;  Laterality: N/A;  . BI-VENTRICULAR IMPLANTABLE CARDIOVERTER DEFIBRILLATOR  (CRT-D)  07/28/2012  . BREAST BIOPSY Bilateral   . CATARACT EXTRACTION W/ INTRAOCULAR LENS IMPLANT Left   . CATARACT EXTRACTION W/PHACO Right 09/15/2013   Procedure: CATARACT EXTRACTION PHACO AND INTRAOCULAR LENS PLACEMENT (IOC);  Surgeon: Elta Guadeloupe T. Gershon Crane, MD;  Location: [facility] ORS;  Service: Ophthalmology;  Laterality: Right;  CDE:  10.74  . COLONOSCOPY N/A 03/04/2015   Procedure: COLONOSCOPY;  Surgeon: Rogene Houston, MD;  Location: [facility] ENDO [facility];  Service: Endoscopy;  Laterality: N/A;  10:50   . ESOPHAGOGASTRODUODENOSCOPY N/A  03/04/2015   Procedure: ESOPHAGOGASTRODUODENOSCOPY (EGD);  Surgeon: Rogene Houston, MD;  Location: [facility];  Service: Endoscopy;  Laterality: N/A;  . ESOPHAGOGASTRODUODENOSCOPY N/A 09/21/2016   Procedure: ESOPHAGOGASTRODUODENOSCOPY (EGD);  Surgeon: Rogene Houston, MD;  [facility];  Service: Endoscopy;  Laterality:  N/A;  . GIVENS CAPSULE STUDY N/A 09/22/2016   Procedure: GIVENS CAPSULE STUDY;  Surgeon: Rogene Houston, MD;  Location: [facility];  Service: Endoscopy;  Laterality: N/A;  . MASTECTOMY Left 1998  . TOTAL KNEE ARTHROPLASTY Right    Dr.Harrison  . TUBAL LIGATION       Current Outpatient Prescriptions  Medication Sig Dispense Refill  . allopurinol (ZYLOPRIM) 100 MG tablet Take 1 tablet (100 mg total) by mouth daily. 90 tablet 0  . carvedilol (COREG) 6.25 MG tablet TAKE 1 TABLET BY MOUTH TWICE A DAY WITH MEALS 60 tablet 6  . furosemide (LASIX) 40 MG tablet Take 40 mg am and take 20 mg (1/2 tablet) in pm 135 tablet 3  . hydroxychloroquine (PLAQUENIL) 200 MG tablet Take 1 tablet (200 mg total) by mouth daily. Monday-Friday 90 tablet 0  . lisinopril (PRINIVIL,ZESTRIL) 20 MG tablet Take 10 mg by mouth daily.     [name] lovastatin (MEVACOR) 40 MG tablet TAKE 1 TABLET (40 MG TOTAL) BY MOUTH AT BEDTIME. 30 tablet 6  . Magnesium 400 MG CAPS Take 400 mg by mouth 2 (two) times daily. 180 capsule 6  . pantoprazole (PROTONIX) 40 MG tablet Take 1 tablet (40 mg total) by mouth daily. 30 tablet 2  . potassium chloride SA (K-DUR,KLOR-CON) 20 MEQ tablet Take 2 tablets (40 mEq total) by mouth daily. 180 tablet 3   No current facility-administered medications for this visit.     Allergies:   Patient has no known allergies.    Social History:  The patient  reports that she quit smoking about 9 years ago. Her smoking use included Cigarettes. She started smoking about 60 years ago. She has a 7.50 pack-year smoking history. Her smokeless tobacco use includes Chew. She reports that she does not drink alcohol or use drugs.   Family History:  The patient's family history includes Diabetes in her father; Hypertension in her brother; Pancreatic cancer in her father.    ROS: All other systems are reviewed and negative. Unless otherwise mentioned in H&P    PHYSICAL EXAM: VS:  BP (!) 108/56   Pulse 80   Ht 5\' 5"   (1.651 m)   Wt 140 lb (63.5 kg)   SpO2 97%   BMI 23.30 kg/m  , BMI Body mass index is 23.3 kg/m. GEN: Well nourished, well developed, in no acute distress  HEENT: normal  Neck: no JVD, carotid bruits, or masses Cardiac: RRR; no murmurs, rubs, or gallops,no edema  Respiratory:  clear to auscultation bilaterally, normal work of breathing GI: soft, nontender, nondistended, + BS MS: no deformity or atrophy  Skin: warm and dry, no rash Neuro:  Strength and sensation are intact Psych: euthymic mood, full affect   Recent Labs: 09/20/2016: ALT 22 09/25/2016: Platelets 222 10/05/2016: Hemoglobin 9.2 10/15/2016: Magnesium 1.6 10/23/2016: BUN 31; Creatinine, Ser 2.08; Potassium 5.3; Sodium 136    Lipid Panel    Component Value Date/Time   CHOL 134 05/23/2012 0905   TRIG 103 05/23/2012 0905   HDL 30 (L) 05/23/2012 0905   CHOLHDL 4.5 05/23/2012 0905   VLDL 21 05/23/2012 0905   LDLCALC 83 05/23/2012 0905      Wt Readings  from Last 3 Encounters:  11/20/16 140 lb (63.5 kg)  10/15/16 144 lb (65.3 kg)  10/15/16 144 lb (65.3 kg)      Other studies Reviewed: EPIC Encounter for ICM Monitoring  Patient Name: NAVPREET SZCZYGIEL is a 76 y.o. female Date:   11/15/2016 Primary Care Physican: [name], MD Primary Cardiologist:Koneswaran/Shalane Florendo NP Electrophysiologist: Lovena Le Dry Weight:unknown Bi-V Pacing: 93.2%                                                                  Attempted call to nephew [name] and left detailed message regarding transmission.    Thoracic impedance abnormal suggesting fluid accumulation from 7/20 until today which is almost at baseline.  Prescribed dosage: Furosemide 40 mg 1 tablet (40 mg total) every AM and 0.5 tablet (20 mg total) every PM.Klor Con 20 mEq 2tablets (40 mEq)daily.   Echocardiogram 07/01/2015 Left ventricle: The cavity size was normal. Wall thickness was   increased in a pattern of mild LVH. Systolic function was    moderately to severely reduced. The estimated ejection fraction   was in the range of 30% to 35%. Diffuse hypokinesis. Doppler   parameters are consistent with abnormal left ventricular   relaxation (grade 1 diastolic dysfunction). Doppler parameters   are consistent with high ventricular filling pressure. - Regional wall motion abnormality: Akinesis of the basal inferior   myocardium. - Aortic valve: Mildly to moderately calcified annulus. Trileaflet. - Mitral valve: There was mild regurgitation. - Left atrium: The atrium was mildly dilated. - Right ventricle: Pacer wire or catheter noted in right ventricle. - Right atrium: Pacer wire or catheter noted in right atrium. - Tricuspid valve: There was mild regurgitation. - Pulmonary arteries: PA peak pressure: 27 mm Hg (S).  ASSESSMENT AND PLAN:  1. Chronic systolic heart failure: She is euvolemic today and doing well. She has 2 bottles of furosemide one at 40 mg daily in the other at 40 mg twice a day. I've advised her to take 40 mg twice a day only. We have combined all of the 40 mg tablets and removed the daily 40 mg bottle. She will continue potassium supplements. She will continue daily weights.  She will continue carvedilol, ACE inhibitor, blood pressure is well-controlled currently. No changes other than above.  2. Frequent diarrhea: This may be related to magnesium 400 mg twice a day. I'm going to decrease it to 400 mg at at bedtime. If she continues to have stomach upset she can stop it for a while. She will continue with potassium replacement.  3. Chronic GERD: Patient states when she takes Protonix her stomach becomes upset as well. I've advised her to decrease it to 20 mg daily from 40 mg daily and follow-up with GI.  4. Hypercholesterolemia: Patient continue on lovastatin 40 mg at bedtime.   Current medicines are reviewed at length with the patient today.  I reviewed all of her medications thoroughly with her. I have written on  her bottles, and she will receive a print out for her nephew to review.  Labs/ tests ordered today include:   Phill Myron. West Pugh, ANP, AACC   11/20/2016 4:28 PM     [facility] [address], [place], [place] [address] Phone: [phone])  [phone]; Fax: [phone]

## 2016-11-20 NOTE — Patient Instructions (Signed)
Medication Instructions:  Your physician has recommended you make the following change in your medication:  Decrease Magnesium to 400 mg Daily in the Evening  Decrease Protonix to 20 mg Daily    Labwork: NONE  Testing/Procedures: NONE   Follow-Up: Your physician recommends that you schedule a follow-up appointment in: 3 Months    Any Other Special Instructions Will Be Listed Below (If Applicable).  Angeline Slim [phone]  For [place]    If you need a refill on your cardiac medications before your next appointment, please call your pharmacy.  Thank you for choosing [facility]!

## 2016-11-29 ENCOUNTER — Encounter (HOSPITAL_COMMUNITY): Payer: Self-pay | Admitting: Adult Health

## 2016-11-29 ENCOUNTER — Encounter (HOSPITAL_COMMUNITY): Payer: Medicare HMO | Attending: Adult Health

## 2016-11-29 ENCOUNTER — Encounter (HOSPITAL_BASED_OUTPATIENT_CLINIC_OR_DEPARTMENT_OTHER): Payer: Medicare HMO | Admitting: Adult Health

## 2016-11-29 DIAGNOSIS — D508 Other iron deficiency anemias: Secondary | ICD-10-CM | POA: Diagnosis not present

## 2016-11-29 DIAGNOSIS — Z170 Estrogen receptor positive status [ER+]: Secondary | ICD-10-CM | POA: Diagnosis not present

## 2016-11-29 DIAGNOSIS — C50412 Malignant neoplasm of upper-outer quadrant of left female breast: Secondary | ICD-10-CM | POA: Diagnosis not present

## 2016-11-29 DIAGNOSIS — Z853 Personal history of malignant neoplasm of breast: Secondary | ICD-10-CM | POA: Diagnosis not present

## 2016-11-29 DIAGNOSIS — N183 Chronic kidney disease, stage 3 unspecified: Secondary | ICD-10-CM

## 2016-11-29 DIAGNOSIS — D638 Anemia in other chronic diseases classified elsewhere: Secondary | ICD-10-CM

## 2016-11-29 LAB — CBC WITH DIFFERENTIAL/PLATELET
Basophils Absolute: 0 10*3/uL (ref 0.0–0.1)
Basophils Relative: 1 %
EOS ABS: 0 10*3/uL (ref 0.0–0.7)
Eosinophils Relative: 1 %
HCT: 27.9 % — ABNORMAL LOW (ref 36.0–46.0)
Hemoglobin: 8.7 g/dL — ABNORMAL LOW (ref 12.0–15.0)
LYMPHS ABS: 1.5 10*3/uL (ref 0.7–4.0)
LYMPHS PCT: 36 %
MCH: 24.6 pg — AB (ref 26.0–34.0)
MCHC: 31.2 g/dL (ref 30.0–36.0)
MCV: 79 fL (ref 78.0–100.0)
MONO ABS: 0.6 10*3/uL (ref 0.1–1.0)
Monocytes Relative: 16 %
Neutro Abs: 1.9 10*3/uL (ref 1.7–7.7)
Neutrophils Relative %: 46 %
Platelets: 180 10*3/uL (ref 150–400)
RBC: 3.53 MIL/uL — ABNORMAL LOW (ref 3.87–5.11)
RDW: 20.1 % — AB (ref 11.5–15.5)
WBC: 4 10*3/uL (ref 4.0–10.5)

## 2016-11-29 LAB — COMPREHENSIVE METABOLIC PANEL
ALT: 20 U/L (ref 14–54)
ANION GAP: 9 (ref 5–15)
AST: 36 U/L (ref 15–41)
Albumin: 3.2 g/dL — ABNORMAL LOW (ref 3.5–5.0)
Alkaline Phosphatase: 100 U/L (ref 38–126)
BUN: 30 mg/dL — ABNORMAL HIGH (ref 6–20)
CHLORIDE: 105 mmol/L (ref 101–111)
CO2: 24 mmol/L (ref 22–32)
Calcium: 9.2 mg/dL (ref 8.9–10.3)
Creatinine, Ser: 1.68 mg/dL — ABNORMAL HIGH (ref 0.44–1.00)
GFR, EST AFRICAN AMERICAN: 33 mL/min — AB (ref >60)
GFR, EST NON AFRICAN AMERICAN: 28 mL/min — AB (ref >60)
Glucose, Bld: 104 mg/dL — ABNORMAL HIGH (ref 65–99)
POTASSIUM: 4.1 mmol/L (ref 3.5–5.1)
Sodium: 138 mmol/L (ref 135–145)
Total Bilirubin: 0.4 mg/dL (ref 0.3–1.2)
Total Protein: 7.9 g/dL (ref 6.5–8.1)

## 2016-11-29 LAB — IRON AND TIBC
Iron: 33 ug/dL (ref 28–170)
SATURATION RATIOS: 15 % (ref 10.4–31.8)
TIBC: 216 ug/dL — AB (ref 250–450)
UIBC: 183 ug/dL

## 2016-11-29 LAB — FERRITIN: Ferritin: 490 ng/mL — ABNORMAL HIGH (ref 11–307)

## 2016-12-02 NOTE — Progress Notes (Signed)
[place] [place], [place] [address]   CLINIC:  Medical Oncology/Hematology  PCP:  [name], MD [place] [address] [phone]   REASON FOR VISIT:  Follow-up for iron-deficiency anemia AND remote h/o left breast cancer (1992)  CURRENT THERAPY: IV iron prn AND observation   BRIEF ONCOLOGIC HISTORY:    Breast cancer ([place])    Initial Diagnosis    Breast cancer        HISTORY OF PRESENT ILLNESS:  (Per Kirby Crigler, PA-C's note on 12/20/15)    INTERVAL HISTORY:  [name] presents for follow-up of her iron-deficiency anemia and h/o left breast cancer s/p mastectomy and 5 years of Tamoxifen therapy.   She is here today unaccompanied. She is very hard of hearing and it is difficult to get a reliable history from the patient as a result.   She tells me that she recently had a "laser procedure" done in [facility] "for my belly." States that she is supposed to see Dr. Laural Golden with GI next week to review the results and figure out what she needs to do next.  Reports continued abdominal pain at times. She has no appetite; she has intermittent nausea making it difficult for her to even prepare foods or eat.  Chart reviewed; weight is down ~10 lbs in the past 6-7 weeks.  Energy levels about 50%.   She is really concerned and confused about her medications.  We spent quite a bit of time reviewing her medication bottles today. She had 3 different bottles of Protonix with different doses. I suspect some of her abdominal pain may be d/t polypharmacy/taking medications incorrectly.    Denies any current blood in her stools or dark/tarry stools. Denies any frank bleeding episodes including blood in her urine, vaginal bleeding, nosebleeds, or gingival bleeding.   She was admitted to the hospital since her last visit to the cancer center for acute GIB from 09/20/16-09/25/16. She underwent EGD which revealed a single non-bleeding  angiodysplastic lesion in duodenum.  Capsule endoscopy performed and showed active bleeding at ~5 sites in jejunum secondary to AV malformations.     REVIEW OF SYSTEMS:  Review of Systems  Constitutional: Positive for appetite change, fatigue and unexpected weight change. Negative for chills and fever.  HENT:  Negative.   Eyes: Negative.   Respiratory: Negative.  Negative for cough and shortness of breath.   Cardiovascular: Negative.  Negative for leg swelling.  Gastrointestinal: Positive for abdominal pain and nausea. Negative for blood in stool, constipation, diarrhea and vomiting.  Endocrine: Negative.   Genitourinary: Negative.    Musculoskeletal: Negative.   Neurological: Positive for numbness (some neuropathy to fingers (chronic)).  Hematological: Negative.   Psychiatric/Behavioral: Negative.      PAST MEDICAL/SURGICAL HISTORY:  Past Medical History:  Diagnosis Date  . Anemia    Hgb of 9-10  . Anemia of chronic disease    Hgb of 9-10 chronically; 06/2010: H&H-10.7/33.5, MCV-81, normal iron studies in 2010   . Arteriosclerotic cardiovascular disease (ASCVD)    Remote PTCA by patient report; LBBB; associated cardiomyopathy, presumed ischemic with EF 40-45% previously, 20% in 06/2009; h/o clinical congestive heart failure; negative stress nuclear in 2009 with inferoseptal and apical scar  . Automatic implantable cardioverter-defibrillator in situ   . Breast cancer ([place])    breast  . Chronic bronchitis ([place])   . Chronic renal disease, stage 3, moderately decreased glomerular filtration rate (GFR) between 30-59 mL/min/1.73 square meter  02/01/2016  . Congestive heart failure (CHF) ([place])   . Elevated cholesterol   . Elevated sed rate   . GERD (gastroesophageal reflux disease)   . Gout   . HOH (hard of hearing)   . Hyperlipidemia   . Hypertension   . Rheumatoid arthritis [facility])    Past Surgical History:  Procedure Laterality Date  . BI-VENTRICULAR IMPLANTABLE CARDIOVERTER  DEFIBRILLATOR N/A 07/28/2012   Procedure: BI-VENTRICULAR IMPLANTABLE CARDIOVERTER DEFIBRILLATOR  (CRT-D);  Surgeon: Evans Lance, MD;  Location: [facility] CATH LAB;  Service: Cardiovascular;  Laterality: N/A;  . BI-VENTRICULAR IMPLANTABLE CARDIOVERTER DEFIBRILLATOR  (CRT-D)  07/28/2012  . BREAST BIOPSY Bilateral   . CATARACT EXTRACTION W/ INTRAOCULAR LENS IMPLANT Left   . CATARACT EXTRACTION W/PHACO Right 09/15/2013   Procedure: CATARACT EXTRACTION PHACO AND INTRAOCULAR LENS PLACEMENT (IOC);  Surgeon: Elta Guadeloupe T. Gershon Crane, MD;  Location: [facility] ORS;  Service: Ophthalmology;  Laterality: Right;  CDE:  10.74  . COLONOSCOPY N/A 03/04/2015   Procedure: COLONOSCOPY;  Surgeon: Rogene Houston, MD;  Location: [facility] ENDO SUITE;  Service: Endoscopy;  Laterality: N/A;  10:50   . ESOPHAGOGASTRODUODENOSCOPY N/A 03/04/2015   Procedure: ESOPHAGOGASTRODUODENOSCOPY (EGD);  Surgeon: Rogene Houston, MD;  Location: [facility] ENDO SUITE;  Service: Endoscopy;  Laterality: N/A;  . ESOPHAGOGASTRODUODENOSCOPY N/A 09/21/2016   Procedure: ESOPHAGOGASTRODUODENOSCOPY (EGD);  Surgeon: Rogene Houston, MD;  Location: [facility] ENDO SUITE;  Service: Endoscopy;  Laterality: N/A;  . GIVENS CAPSULE STUDY N/A 09/22/2016   Procedure: GIVENS CAPSULE STUDY;  Surgeon: Rogene Houston, MD;  Location: [facility] ENDO SUITE;  Service: Endoscopy;  Laterality: N/A;  . MASTECTOMY Left 1998  . TOTAL KNEE ARTHROPLASTY Right    Dr.Harrison  . TUBAL LIGATION       SOCIAL HISTORY:  Social History   Social History  . Marital status: Married    Spouse name: N/A  . Number of children: N/A  . Years of education: N/A   Occupational History  . Retired    Social History Main Topics  . Smoking status: Former Smoker    Packs/day: 0.25    Years: 30.00    Types: Cigarettes    Start date: 09/17/1956    Quit date: 04/24/2007  . Smokeless tobacco: Current User    Types: Chew     Comment: 07/03/2013 "smoked some; don't know how much or how long or when I quit"  . Alcohol use No  .  Drug use: No  . Sexual activity: Not Currently    Birth control/ protection: Post-menopausal   Other Topics Concern  . Not on file   Social History Narrative   Married, lives with spouse   1 daughter, died in 56 in a car accident   Retired - worked in [employer] fund[employer]   No recent travel    FAMILY HISTORY:  Family History  Problem Relation Age of Onset  . Diabetes Father   . Pancreatic cancer Father   . Hypertension Brother     CURRENT MEDICATIONS:  Outpatient Encounter Prescriptions as of 11/29/2016  Medication Sig  . carvedilol (COREG) 6.25 MG tablet TAKE 1 TABLET BY MOUTH TWICE A DAY WITH MEALS  . furosemide (LASIX) 40 MG tablet Take 40 mg am and take 20 mg (1/2 tablet) in pm  . hydroxychloroquine (PLAQUENIL) 200 MG tablet Take 1 tablet (200 mg total) by mouth daily. Monday-Friday  . lisinopril (PRINIVIL,ZESTRIL) 20 MG tablet Take 10 mg by mouth daily.   [name] lovastatin (MEVACOR) 40 MG tablet TAKE 1 TABLET (40 MG TOTAL)  BY MOUTH AT BEDTIME.  . Magnesium 400 MG CAPS Take 400 mg by mouth 2 (two) times daily.  . magnesium oxide (MAG-OX) 400 MG tablet Take 1 tablet (400 mg total) by mouth at bedtime.  . pantoprazole (PROTONIX) 20 MG tablet Take 1 tablet (20 mg total) by mouth daily.  . potassium chloride SA (K-DUR,KLOR-CON) 20 MEQ tablet Take 2 tablets (40 mEq total) by mouth daily.  [name] allopurinol (ZYLOPRIM) 100 MG tablet Take 1 tablet (100 mg total) by mouth daily.   No facility-administered encounter medications on file as of 11/29/2016.     ALLERGIES:  No Known Allergies   PHYSICAL EXAM:  ECOG Performance status: 2 - Symptomatic; requires occasional assistance.   BP 121/47 HR 79 Resp 18 O2 sat 96% Weight 139 lbs     Physical Exam  Constitutional: She is oriented to person, place, and time.  Chronically-ill appearing female in no acute distress  -Seen seated in wheelchair   HENT:  Head: Normocephalic.  Mouth/Throat: Oropharynx is clear and moist. No oropharyngeal  exudate.  Very hard of hearing   Eyes: Pupils are equal, round, and reactive to light. Conjunctivae are normal. No scleral icterus.  Neck: Normal range of motion. Neck supple.  Cardiovascular: Normal rate and regular rhythm.   Pulmonary/Chest: Effort normal and breath sounds normal. No respiratory distress.  Abdominal: Soft. Bowel sounds are normal. There is no tenderness.  Musculoskeletal: Normal range of motion. She exhibits edema (1+ BLE/ankle/pedal edema).  Lymphadenopathy:    She has no cervical adenopathy.       Right: No supraclavicular adenopathy present.       Left: No supraclavicular adenopathy present.  Neurological: She is alert and oriented to person, place, and time. No cranial nerve deficit.  Skin: Skin is warm and dry. No rash noted.  Psychiatric: Mood and affect normal.  Nursing note and vitals reviewed.    LABORATORY DATA:  I have reviewed the labs as listed.  CBC    Component Value Date/Time   WBC 4.0 11/29/2016 1155   RBC 3.53 (L) 11/29/2016 1155   HGB 8.7 (L) 11/29/2016 1155   HCT 27.9 (L) 11/29/2016 1155   PLT 180 11/29/2016 1155   MCV 79.0 11/29/2016 1155   MCH 24.6 (L) 11/29/2016 1155   MCHC 31.2 11/29/2016 1155   RDW 20.1 (H) 11/29/2016 1155   LYMPHSABS 1.5 11/29/2016 1155   MONOABS 0.6 11/29/2016 1155   EOSABS 0.0 11/29/2016 1155   BASOSABS 0.0 11/29/2016 1155   CMP Latest Ref Rng & Units 11/29/2016 10/23/2016 10/15/2016  Glucose 65 - 99 mg/dL 104(H) 114(H) 108(H)  BUN 6 - 20 mg/dL 30(H) 31(H) 18  Creatinine 0.44 - 1.00 mg/dL 1.68(H) 2.08(H) 0.99  Sodium 135 - 145 mmol/L 138 136 136  Potassium 3.5 - 5.1 mmol/L 4.1 5.3(H) 3.0(L)  Chloride 101 - 111 mmol/L 105 99(L) 99(L)  CO2 22 - 32 mmol/L '24 25 27  ' Calcium 8.9 - 10.3 mg/dL 9.2 9.0 8.3(L)  Total Protein 6.5 - 8.1 g/dL 7.9 - -  Total Bilirubin 0.3 - 1.2 mg/dL 0.4 - -  Alkaline Phos 38 - 126 U/L 100 - -  AST 15 - 41 U/L 36 - -  ALT 14 - 54 U/L 20 - -    PENDING LABS:  Iron studies pending     DIAGNOSTIC IMAGING:  Last mammogram: 03/05/16   PATHOLOGY:     ASSESSMENT & PLAN:   Iron deficiency anemia:  -Likely secondary to chronic GI blood loss.  -  Hgb 8.7 g/dL today. Chronic kidney disease may be playing a role as well; EGFR 33 today. Could consider ESA therapy in future, once iron studies are normal.  -Recent hospitalization for GIB. GI evaluation revealed at least 5 bleeding lesions in jejunum d/t AV malformations.  She was referred to [facility] for additional evaluation. She is scheduled to see Dr. Laural Golden here in [place] for follow-up next week per pt report.  -Iron studies are pending. Last IV iron infusion was Feraheme in 05/2016. We will make arrangements for additional IV iron if needed. Goal is to keep her ferritin >100.  Oncology Flowsheet 06/08/2016  ferumoxytol [facility]) IV 510 mg  -Return to cancer center in 3 months for follow-up with labs.    History of left breast cancer:  -No breast concerns today. She is now 16+ years out from her breast cancer diagnosis, which is reassuring.   -Due for unilateral right breast mammogram in 02/2017; will need to place orders for mammogram at next visit. She will also need clinical breast exam at that visit as well.   Potential non-compliance with medication regimen d/t polypharmacy:  -I spent quite a bit of today's visit with [name] reviewing her medication bottles, how to take her meds, and what they were all for.  She had 3 different bottles of Protonix in her med bag. I suspect she may be taking her medications incorrectly, which may be contributing to her GI complaints.  I offered to have nursing provide her with a written list of medications with instructions and indications for use to help with her confusion, but she declined. "I got my little system worked out where I write on the bottles what they are for."   -She may benefit from home health nursing to help with medication adherence; will defer  to her PCP as they deem clinically appropriate.     **Patient wants to make sure that we know that her nephew, [name], can be contacted regarding her medical condition/appointments. His number is [phone] (per previous documentation in pt's chart).     Dispo:  -Return to cancer center in 3 months with labs.    All questions were answered to patient's stated satisfaction. Encouraged patient to call with any new concerns or questions before her next visit to the cancer center and we can certain see her sooner, if needed.    Plan of care discussed with Dr. Talbert Cage, who agrees with above aforementioned.     Orders placed this encounter:  Orders Placed This Encounter  Procedures  . CBC with Differential/Platelet  . Comprehensive metabolic panel  . Ferritin  . Iron and TIBC     Mike Craze, NP [place] [phone]

## 2016-12-04 ENCOUNTER — Ambulatory Visit (INDEPENDENT_AMBULATORY_CARE_PROVIDER_SITE_OTHER): Payer: Medicare HMO | Admitting: Internal Medicine

## 2016-12-04 ENCOUNTER — Encounter (INDEPENDENT_AMBULATORY_CARE_PROVIDER_SITE_OTHER): Payer: Self-pay | Admitting: Internal Medicine

## 2016-12-04 VITALS — BP 120/52 | HR 72 | Temp 98.6°F | Ht 65.0 in | Wt 138.5 lb

## 2016-12-04 DIAGNOSIS — K921 Melena: Secondary | ICD-10-CM | POA: Diagnosis not present

## 2016-12-04 NOTE — Progress Notes (Signed)
Subjective:    Patient ID: [name], female    DOB: 04-24-1940, 76 y.o.   MRN: [id number]  HPI Here today for f/u after undergoing small intestinal endoscopy in July of this year. Impression:   The stomach was normal.     Impression:- A single bleeding angiodysplastic lesion in the jejunum.    Treated with argon plasma coagulation (APC).   - A few non-bleeding angiodysplastic lesions in the    jejunum. Treated with argon plasma coagulation (APC).   - Normal esophagus.   - Normal stomach.   She tells she is doing fair. Her BMs are fair. Her appetite is good. No weight loss. BMs x 1 a day.  Her last colonoscopy was in 2016. Followed by [facility] at [facility] for IDA  11/29/2016 Ferritin 490 Iron 216  09/23/2016 Given Capsule:    Small nonbleeding duodenal AV malformation noted at 16 minutes and 19 seconds(also seen at EGD). Pool of fresh blood noted at 49:06,  49:11, 5606,  02:01:11 and 02:02:27. AV malformations also noted 00:50:36 00:51:15 and 00:59:13. Coffee-ground material noted distal to these lesions.  09/21/2016 EGD Melena and anemia Impression:               - Normal esophagus.                           - Z-line regular, 38 cm from the incisors.                           - Normal stomach.                           - Normal duodenal bulb.                           - A single non-bleeding angiodysplastic lesion in                            the duodenum.                           - No specimens collected. CBC Latest Ref Rng & Units 11/29/2016 10/05/2016 09/25/2016  WBC 4.0 - 10.5 K/uL 4.0 - 7.2  Hemoglobin 12.0 - 15.0 g/dL 8.7(L) 9.2(L) 8.3(L)  Hematocrit 36.0 - 46.0 % 27.9(L) 29.4(L) 26.3(L)  Platelets 150 - 400 K/uL 180 - 222       Review of Systems Past Medical History:  Diagnosis  Date  . Anemia    Hgb of 9-10  . Anemia of chronic disease    Hgb of 9-10 chronically; 06/2010: H&H-10.7/33.5, MCV-81, normal iron studies in 2010   . Arteriosclerotic cardiovascular disease (ASCVD)    Remote PTCA by patient report; LBBB; associated cardiomyopathy, presumed ischemic with EF 40-45% previously, 20% in 06/2009; h/o clinical congestive heart failure; negative stress nuclear in 2009 with inferoseptal and apical scar  . Automatic implantable cardioverter-defibrillator in situ   . Breast cancer ([place])    breast  . Chronic bronchitis ([place])   . Chronic renal disease, stage 3, moderately decreased glomerular filtration rate (GFR) between 30-59 mL/min/1.73 square meter 02/01/2016  . Congestive heart failure (CHF) ([place])   . Elevated cholesterol   . Elevated sed rate   . GERD (gastroesophageal  reflux disease)   . Gout   . HOH (hard of hearing)   . Hyperlipidemia   . Hypertension   . Rheumatoid arthritis [facility])     Past Surgical History:  Procedure Laterality Date  . BI-VENTRICULAR IMPLANTABLE CARDIOVERTER DEFIBRILLATOR N/A 07/28/2012   Procedure: BI-VENTRICULAR IMPLANTABLE CARDIOVERTER DEFIBRILLATOR  (CRT-D);  Surgeon: Evans Lance, MD;  Location: [facility] CATH LAB;  Service: Cardiovascular;  Laterality: N/A;  . BI-VENTRICULAR IMPLANTABLE CARDIOVERTER DEFIBRILLATOR  (CRT-D)  07/28/2012  . BREAST BIOPSY Bilateral   . CATARACT EXTRACTION W/ INTRAOCULAR LENS IMPLANT Left   . CATARACT EXTRACTION W/PHACO Right 09/15/2013   Procedure: CATARACT EXTRACTION PHACO AND INTRAOCULAR LENS PLACEMENT (IOC);  Surgeon: Elta Guadeloupe T. Gershon Crane, MD;  Location: [facility] ORS;  Service: Ophthalmology;  Laterality: Right;  CDE:  10.74  . COLONOSCOPY N/A 03/04/2015   Procedure: COLONOSCOPY;  Surgeon: Rogene Houston, MD;  Location: [facility] ENDO SUITE;  Service: Endoscopy;  Laterality: N/A;  10:50   . ESOPHAGOGASTRODUODENOSCOPY N/A 03/04/2015   Procedure: ESOPHAGOGASTRODUODENOSCOPY (EGD);  Surgeon: Rogene Houston, MD;  Location: [facility]  ENDO SUITE;  Service: Endoscopy;  Laterality: N/A;  . ESOPHAGOGASTRODUODENOSCOPY N/A 09/21/2016   Procedure: ESOPHAGOGASTRODUODENOSCOPY (EGD);  Surgeon: Rogene Houston, MD;  Location: [facility] ENDO SU[facility];  Service: Endoscopy;  Laterality: N/A;  . GIVENS CAPSULE STUDY N/A 09/22/2016   Procedure: GIVENS CAPSULE STUDY;  Surgeon: Rogene Houston, MD;  Location: [facility] ENDO SU[facility];  Service: Endoscopy;  Laterality: N/A;  . MASTECTOMY Left 1998  . TOTAL KNEE ARTHROPLASTY Right    Dr.Harrison  . TUBAL LIGATION      No Known Allergies  Current Outpatient Prescriptions on File Prior to Visit  Medication Sig Dispense Refill  . carvedilol (COREG) 6.25 MG tablet TAKE 1 TABLET BY MOUTH TWICE A DAY WITH MEALS 60 tablet 6  . furosemide (LASIX) 40 MG tablet Take 40 mg am and take 20 mg (1/2 tablet) in pm 135 tablet 3  . hydroxychloroquine (PLAQUENIL) 200 MG tablet Take 1 tablet (200 mg total) by mouth daily. Monday-Friday 90 tablet 0  . lisinopril (PRINIVIL,ZESTRIL) 20 MG tablet Take 10 mg by mouth daily.     [name] lovastatin (MEVACOR) 40 MG tablet TAKE 1 TABLET (40 MG TOTAL) BY MOUTH AT BEDTIME. 30 tablet 6  . Magnesium 400 MG CAPS Take 400 mg by mouth 2 (two) times daily. 180 capsule 6  . magnesium oxide (MAG-OX) 400 MG tablet Take 1 tablet (400 mg total) by mouth at bedtime. 90 tablet 3  . pantoprazole (PROTONIX) 20 MG tablet Take 1 tablet (20 mg total) by mouth daily. 90 tablet 3  . potassium chloride SA (K-DUR,KLOR-CON) 20 MEQ tablet Take 2 tablets (40 mEq total) by mouth daily. 180 tablet 3  . allopurinol (ZYLOPRIM) 100 MG tablet Take 1 tablet (100 mg total) by mouth daily. 90 tablet 0   No current facility-administered medications on file prior to visit.         Objective:   Physical Exam Blood pressure (!) 120/52, pulse 72, temperature 98.6 F (37 C), height 5\' 5"  (1.651 m), weight 138 lb 8 oz (62.8 kg).  Alert and oriented. Skin warm and dry. Oral mucosa is moist.   . Sclera anicteric, conjunctivae is  pink. Thyroid not enlarged. No cervical lymphadenopathy. Lungs clear. Heart regular rate and rhythm.  Abdomen is soft. Bowel sounds are positive. No hepatomegaly. No abdominal masses felt. No tenderness.  No edema to lower extremities.          Assessment &  Plan:  Small bowel AVMs ablated with argon plasma coagulatotion .  Hemoglobin 8.7 on 11/29/2017. She denies seeing any black stools. Will repeat CBC in 1 week.  (Discussed with Dr. Laural Golden).  Take Iron one a day.

## 2016-12-04 NOTE — Patient Instructions (Addendum)
Cbc in 1 weeks Iron one tab daily

## 2016-12-07 ENCOUNTER — Other Ambulatory Visit: Payer: Self-pay | Admitting: Rheumatology

## 2016-12-07 DIAGNOSIS — M0579 Rheumatoid arthritis with rheumatoid factor of multiple sites without organ or systems involvement: Secondary | ICD-10-CM

## 2016-12-10 ENCOUNTER — Telehealth: Payer: Self-pay | Admitting: Rheumatology

## 2016-12-10 DIAGNOSIS — J40 Bronchitis, not specified as acute or chronic: Secondary | ICD-10-CM | POA: Diagnosis not present

## 2016-12-10 DIAGNOSIS — J8410 Pulmonary fibrosis, unspecified: Secondary | ICD-10-CM | POA: Diagnosis not present

## 2016-12-10 DIAGNOSIS — M069 Rheumatoid arthritis, unspecified: Secondary | ICD-10-CM | POA: Diagnosis not present

## 2016-12-10 DIAGNOSIS — K219 Gastro-esophageal reflux disease without esophagitis: Secondary | ICD-10-CM | POA: Diagnosis not present

## 2016-12-10 DIAGNOSIS — I5022 Chronic systolic (congestive) heart failure: Secondary | ICD-10-CM | POA: Diagnosis not present

## 2016-12-10 DIAGNOSIS — Z1389 Encounter for screening for other disorder: Secondary | ICD-10-CM | POA: Diagnosis not present

## 2016-12-10 DIAGNOSIS — I10 Essential (primary) hypertension: Secondary | ICD-10-CM | POA: Diagnosis not present

## 2016-12-10 NOTE — Telephone Encounter (Signed)
Patient needs a 10 day supply of Allopurinol sent to local pharmacy. [facility]

## 2016-12-11 MED ORDER — ALLOPURINOL 100 MG PO TABS: 100.0000 mg | ORAL_TABLET | Freq: Every day | ORAL | 0 refills | Status: DC

## 2016-12-11 NOTE — Telephone Encounter (Signed)
Last Visit: 10/11/16 Next Visit: 01/11/17 Labs: 11/29/16 Hgb 8.7 Creat 1.68 GFR 33 Previous Hgb 9.2 Creat 2.08 GFR 25  Okay per Dr.Deveshwar  Spoke with [name] and he states that [name] has her PLQ eye exam scheduled.

## 2016-12-11 NOTE — Telephone Encounter (Signed)
ok 

## 2016-12-11 NOTE — Telephone Encounter (Signed)
Last Visit: 10/11/16 Next Visit: 01/11/17 Labs: 11/29/16 Hgb 8.7 Creat 1.68 GFR 33 Previous Hgb 9.2 Creat 2.08 GFR 25 PLQ Eye Exam: 11/10/15 WNL   Okay to refill PLQ and Allopurinol?

## 2016-12-12 LAB — CUP PACEART REMOTE DEVICE CHECK
Battery Remaining Longevity: 51 mo
Battery Voltage: 2.97 V
Brady Statistic AS VP Percent: 88.7 %
Brady Statistic RA Percent Paced: 9.6 %
Date Time Interrogation Session: 20180726073426
HIGH POWER IMPEDANCE MEASURED VALUE: 84 Ohm
Implantable Lead Implant Date: 20140407
Implantable Lead Implant Date: 20140407
Implantable Lead Location: 753858
Implantable Lead Location: 753859
Implantable Lead Location: 753860
Implantable Lead Model: 4194
Implantable Lead Model: 5076
Implantable Pulse Generator Implant Date: 20140407
Lead Channel Impedance Value: 380 Ohm
Lead Channel Impedance Value: 399 Ohm
Lead Channel Pacing Threshold Amplitude: 0.625 V
Lead Channel Pacing Threshold Amplitude: 1 V
Lead Channel Pacing Threshold Pulse Width: 0.4 ms
Lead Channel Pacing Threshold Pulse Width: 0.5 ms
Lead Channel Sensing Intrinsic Amplitude: 26.5 mV
Lead Channel Sensing Intrinsic Amplitude: 26.5 mV
Lead Channel Setting Pacing Amplitude: 1.5 V
Lead Channel Setting Pacing Amplitude: 2 V
Lead Channel Setting Sensing Sensitivity: 0.3 mV
MDC IDC LEAD IMPLANT DT: 20140407
MDC IDC MSMT LEADCHNL LV IMPEDANCE VALUE: 342 Ohm
MDC IDC MSMT LEADCHNL LV IMPEDANCE VALUE: 627 Ohm
MDC IDC MSMT LEADCHNL LV IMPEDANCE VALUE: 855 Ohm
MDC IDC MSMT LEADCHNL LV PACING THRESHOLD AMPLITUDE: 0.5 V
MDC IDC MSMT LEADCHNL RA PACING THRESHOLD PULSEWIDTH: 0.4 ms
MDC IDC MSMT LEADCHNL RA SENSING INTR AMPL: 2.375 mV
MDC IDC MSMT LEADCHNL RA SENSING INTR AMPL: 2.375 mV
MDC IDC MSMT LEADCHNL RV IMPEDANCE VALUE: 513 Ohm
MDC IDC SET LEADCHNL LV PACING PULSEWIDTH: 0.5 ms
MDC IDC SET LEADCHNL RV PACING AMPLITUDE: 2.5 V
MDC IDC SET LEADCHNL RV PACING PULSEWIDTH: 0.4 ms
MDC IDC STAT BRADY AP VP PERCENT: 8.73 %
MDC IDC STAT BRADY AP VS PERCENT: 1 %
MDC IDC STAT BRADY AS VS PERCENT: 1.58 %
MDC IDC STAT BRADY RV PERCENT PACED: 10.47 %

## 2016-12-17 ENCOUNTER — Ambulatory Visit (INDEPENDENT_AMBULATORY_CARE_PROVIDER_SITE_OTHER): Payer: Medicare HMO

## 2016-12-17 ENCOUNTER — Telehealth: Payer: Self-pay

## 2016-12-17 DIAGNOSIS — I5022 Chronic systolic (congestive) heart failure: Secondary | ICD-10-CM

## 2016-12-17 DIAGNOSIS — Z95810 Presence of automatic (implantable) cardiac defibrillator: Secondary | ICD-10-CM | POA: Diagnosis not present

## 2016-12-17 NOTE — Progress Notes (Signed)
EPIC Encounter for ICM Monitoring  Patient Name: [name] is a 76 y.o. female Date: 12/17/2016 Primary Care Physican: Fanta, [name], MD Primary Cardiologist:Koneswaran/Lawrence NP Electrophysiologist: Taylor Dry Weight:unknown Bi-V Pacing: 94%         Attempted call to nephew, Robert Blackwell and unable to reach.  Left message to return call.  Transmission reviewed.    Thoracic impedance abnormal suggesting fluid accumulation since 12/03/2016.  Prescribed dosage: Furosemide 40 mg 1 tablet (40 mg total) every AM and 0.5 tablet (20 mg total) every PM.Klor Con 20 mEq 2tablets (40 mEq)daily.  Labs: 10/23/2016 Creatinine 2.08 BUN 31, Potassium 5.3, Sodium 136, EGFR 22-25 10/15/2016 Creatinine 0.99, BUN 18, Potassium 3.0, Sodium 136, EGFR 53->60 10/08/2016 Creatinine 0.95, BUN 16, Potassium 3.1, Sodium 140, EGFR 56->60 09/23/2016 Creatinine 0.74, BUN 13, Potassium 4.0, Sodium 141, EGFR >60 09/22/2016 Creatinine 0.90, BUN 15, Potassium 3.9, Sodium 141, EGFR >60  09/21/2016 Creatinine 1.05, BUN 26, Potassium 3.7, Sodium 140, EGFR 50-57  09/20/2016 Creatinine 1.53, BUN 37, Potassium 3.9, Sodium 140, EGFR 31-36 09/05/2016 Creatinine 1.25, BUN 29, Potassium 4.0, Sodium 137, EGFR 40-47  08/08/2016 Creatinine 1.10, BUN 24, Potassium 3.6, Sodium 136, EGFR 47-55 07/19/2016 Creatinine 1.26, BUN 29, Potassium 3.6, Sodium 139, EGFR 40-46 06/22/2016 Creatinine 1.44, BUN 37, Potassium 3.7, Sodium 140, EGFR 34-39 06/01/2016 Creatinine 1.68, BUN 43, Potassium 4.7, Sodium 132, EGFR 28-33 01/31/2016 Creatinine 1.31, BUN 34, Potassium 4.5, Sodium 137 GFR 38-44 01/11/2016 Creatinine 1.48, BUN 38, Potassium 4.1, Sodium 135 GFR 33-38 12/20/2015 Creatinine 1.77, BUN 40, Potassium 4.0, Sodium 140, GFR 27-31 12/02/2015 Creatinine 1.38, BUN 28, Potassium 4.0, Sodium 136, GFR 36-42  Recommendations:  NONE - Unable to reach patient   Follow-up plan: ICM clinic phone appointment on 12/21/2016 to  recheck fluid levels.  Office appointment scheduled 02/20/2017 with Dr. Koneswaran.  Copy of ICM check sent to Dr Koneswaran and Dr. Taylor.   3 month ICM trend: 12/17/2016   1 Year ICM trend:      Laurie S Short, RN 12/17/2016 9:44 AM    

## 2016-12-17 NOTE — Telephone Encounter (Signed)
Remote ICM transmission received.  Attempted call to patient and left detailed message regarding transmission and requested call back.

## 2016-12-20 NOTE — Progress Notes (Signed)
Received call back from nephew, [name].  He stated patient reported she is feeling fine and denied any fluid symptoms such as leg swelling. Advised would recheck fluid levels tomorrow and for her to limit salt intake.

## 2016-12-21 ENCOUNTER — Telehealth: Payer: Self-pay

## 2016-12-21 ENCOUNTER — Ambulatory Visit (INDEPENDENT_AMBULATORY_CARE_PROVIDER_SITE_OTHER): Payer: Self-pay

## 2016-12-21 DIAGNOSIS — Z95810 Presence of automatic (implantable) cardiac defibrillator: Secondary | ICD-10-CM

## 2016-12-21 DIAGNOSIS — I5022 Chronic systolic (congestive) heart failure: Secondary | ICD-10-CM

## 2016-12-21 NOTE — Progress Notes (Signed)
EPIC Encounter for ICM Monitoring  Patient Name: [name] is a 76 y.o. female Date: 12/21/2016 Primary Care Physican: [name], MD Primary Cardiologist:Koneswaran/Lawrence NP Electrophysiologist: Lovena Le Dry Weight:unknown Bi-V Pacing: 95.9%      Attempted call to nephew, [name].  Transmission reviewed   Thoracic impedance continues to be abnormal suggesting fluid accumulation worsening since 11/21/16.  Prescribed dosage: Furosemide 40 mg 1 tablet (40 mg total) every AM and 0.5 tablet (20 mg total) every PM.Klor Con 20 mEq 2tablets (40 mEq)daily.  Labs: 11/29/2016 Creatinine 1.68, BUN 30, Potassium 4.1, Sodium 138, EGFR 28-33 10/23/2016 Creatinine 2.08 BUN 31, Potassium 5.3, Sodium 136, EGFR 22-25 10/15/2016 Creatinine 0.99, BUN 18, Potassium 3.0, Sodium 136, EGFR 53->60 10/08/2016 Creatinine 0.95, BUN 16, Potassium 3.1, Sodium 140, EGFR 56->60 09/23/2016 Creatinine 0.74, BUN 13, Potassium 4.0, Sodium 141, EGFR >60 09/22/2016 Creatinine 0.90, BUN 15, Potassium 3.9, Sodium 141, EGFR >60  09/21/2016 Creatinine 1.05, BUN 26, Potassium 3.7, Sodium 140, EGFR 50-57  09/20/2016 Creatinine 1.53, BUN 37, Potassium 3.9, Sodium 140, EGFR 31-36 09/05/2016 Creatinine 1.25, BUN 29, Potassium 4.0, Sodium 137, EGFR 40-47  08/08/2016 Creatinine 1.10, BUN 24, Potassium 3.6, Sodium 136, EGFR 47-55 07/19/2016 Creatinine 1.26, BUN 29, Potassium 3.6, Sodium 139, EGFR 40-46 06/22/2016 Creatinine 1.44, BUN 37, Potassium 3.7, Sodium 140, EGFR 34-39 06/01/2016 Creatinine 1.68, BUN 43, Potassium 4.7, Sodium 132, EGFR 28-33  Recommendations: NONE - Unable to reach patient   Follow-up plan: ICM clinic phone appointment on 12/27/2016 (manual send).  Office appointment scheduled 02/20/2017 with Dr. [name].  Copy of ICM check sent to Dr. [name], [name], NP and Dr. Lovena Le.   3 month ICM trend: 12/21/2016   1 Year ICM trend:      [name],  RN 12/21/2016 9:12 AM

## 2016-12-21 NOTE — Telephone Encounter (Signed)
Remote ICM transmission received.  Attempted call to nephew, [name] and left detailed message to return call regarding transmission.

## 2016-12-21 NOTE — Progress Notes (Signed)
Nephew returned call and left voice mail message that patient is doing fine and denied any fluid symptoms.

## 2016-12-25 NOTE — Progress Notes (Signed)
Shelby Mattocks, returned call.  He said he confirmed with patient the Furosemide dosage and it taking as prescribed.  He said she is feeling fine at this time.  Discussed limiting salt intake to 2000 mg daily.  Will recheck fluid levels on 9/6 and he will be off on Friday and can call him at that time.

## 2016-12-27 ENCOUNTER — Telehealth: Payer: Self-pay

## 2016-12-27 NOTE — Telephone Encounter (Signed)
Attempted call to nephew [name].  LMOVM requesting that pt send manual transmission

## 2016-12-31 ENCOUNTER — Telehealth: Payer: Self-pay

## 2016-12-31 NOTE — Telephone Encounter (Signed)
Attempted return call to nephew [name] as requested by voice mail message regarding transmission.  Left message transmission was not received as scheduled on 9/6 and left him a message that day that a manual transmission would need to be sent.  Advised to send remote transmission tomorrow if possible and to call back.

## 2017-01-01 DIAGNOSIS — Z79899 Other long term (current) drug therapy: Secondary | ICD-10-CM | POA: Diagnosis not present

## 2017-01-04 ENCOUNTER — Telehealth: Payer: Self-pay

## 2017-01-04 NOTE — Telephone Encounter (Signed)
Nephew called and stated he would send an updated ICM remote transmission over the weekend for review on Monday.

## 2017-01-04 NOTE — Progress Notes (Signed)
Office Visit Note  Patient: [name]             Date of Birth: 02-Mar-1941           MRN: [id number]             PCP: [name], MD Referring: [name], MD Visit Date: 01/11/2017 Occupation: '@GUAROCC' @    Subjective:  Pain in hands.   History of Present Illness: JOURDIN GENS is a 76 y.o. female with history of rheumatoid arthritis and gout. She was accompanied by her nephew today. According to patient she continues to have some swelling in her right wrist and some discomfort in her hands. She does not recall having a gout flare. None of the other joints are painful. Patient's nephew reports that she was taken off all the medications due to GI bleed. She's having a flare with increased pain and swelling. She has resumed most of her medications except for colchicine.  Activities of Daily Living:  Patient reports morning stiffness for 1 hour.   Patient Denies nocturnal pain.  Difficulty dressing/grooming: Denies Difficulty climbing stairs: Denies Difficulty getting out of chair: Denies Difficulty using hands for taps, buttons, cutlery, and/or writing: Denies   Review of Systems  Constitutional: Negative for fatigue, night sweats, weight gain, weight loss and weakness.  HENT: Negative for mouth sores, trouble swallowing, trouble swallowing, mouth dryness and nose dryness.   Eyes: Negative for pain, discharge, redness, visual disturbance and dryness.  Respiratory: Negative for cough, shortness of breath and difficulty breathing.   Cardiovascular: Negative for chest pain, palpitations, hypertension, irregular heartbeat and swelling in legs/feet.  Gastrointestinal: Negative for blood in stool, constipation, diarrhea and nausea.  Endocrine: Negative for excessive thirst and increased urination.  Genitourinary: Negative for painful urination and vaginal dryness.  Musculoskeletal: Positive for arthralgias, joint pain and joint swelling. Negative for myalgias, muscle  weakness, morning stiffness, muscle tenderness and myalgias.  Skin: Negative for color change, rash, hair loss, skin tightness, ulcers and sensitivity to sunlight.  Allergic/Immunologic: Negative for susceptible to infections.  Neurological: Negative for dizziness, headaches, memory loss and night sweats.  Hematological: Negative for bruising/bleeding tendency and swollen glands.  Psychiatric/Behavioral: Negative for depressed mood and sleep disturbance. The patient is not nervous/anxious.     PMFS History:  Patient Active Problem List   Diagnosis Date Noted  . Acute blood loss anemia 09/20/2016  . Ischemic cardiomyopathy 09/20/2016  . Chronic combined systolic and diastolic CHF (congestive heart failure) ([place]) 09/20/2016  . CKD (chronic kidney disease) stage 3, GFR 30-59 ml/min 09/20/2016  . History of breast cancer 08/02/2016  . Primary osteoarthritis of both knees 08/02/2016  . Primary osteoarthritis of both feet 08/02/2016  . History of anemia 07/17/2016  . History of CHF (congestive heart failure) 06/04/2016  . History of chronic kidney disease 06/04/2016  . Symptomatic anemia 06/04/2016  . Elevated sedimentation rate 03/03/2016  . Idiopathic chronic gout of multiple sites without tophus 03/03/2016  . Total knee replacement status, right 03/03/2016  . High risk medication use 03/03/2016  . Chronic kidney disease (CKD) stage G3b/A1, moderately decreased glomerular filtration rate (GFR) between 30-44 mL/min/1.73 square meter and albuminuria creatinine ratio less than 30 mg/g 02/01/2016  . CAD S/P remote PCI- no details 09/02/2014  . Cardiomyopathy, ischemic-EF 30-35% March 2015 09/02/2014  . Diastolic dysfunction-grade 2 09/02/2014  . CHF exacerbation ([place]) 08/28/2014  . Acute on chronic combined systolic and diastolic congestive heart failure ([place]) 08/28/2014  . Iron deficiency anemia  08/20/2013  . Malnutrition of moderate degree ([place]) 07/21/2013  . PNA (pneumonia) 07/19/2013    . HCAP (healthcare-associated pneumonia) 07/17/2013  . Sepsis ([place]) 07/17/2013  . ARF (acute renal failure) ([place]) 07/17/2013  . Rheumatoid arthritis ([place]) 07/04/2013  . Hypokalemia 07/03/2013  . Chest pain 07/03/2013  . Biventricular ICD in place (MDT 2014) 11/06/2012  . Anemia of chronic disease   . Breast cancer ([place])   . Hypertension   . Dyslipidemia 05/24/2009  . Chronic systolic heart failure ([place]) 05/24/2009  . Primary osteoarthritis of both hands 08/11/2007    Past Medical History:  Diagnosis Date  . Anemia    Hgb of 9-10  . Anemia of chronic disease    Hgb of 9-10 chronically; 06/2010: H&H-10.7/33.5, MCV-81, normal iron studies in 2010   . Arteriosclerotic cardiovascular disease (ASCVD)    Remote PTCA by patient report; LBBB; associated cardiomyopathy, presumed ischemic with EF 40-45% previously, 20% in 06/2009; h/o clinical congestive heart failure; negative stress nuclear in 2009 with inferoseptal and apical scar  . Automatic implantable cardioverter-defibrillator in situ   . Breast cancer ([place])    breast  . Chronic bronchitis ([place])   . Chronic renal disease, stage 3, moderately decreased glomerular filtration rate (GFR) between 30-59 mL/min/1.73 square meter 02/01/2016  . Congestive heart failure (CHF) ([place])   . Elevated cholesterol   . Elevated sed rate   . GERD (gastroesophageal reflux disease)   . GI bleed   . Gout   . HOH (hard of hearing)   . Hyperlipidemia   . Hypertension   . Rheumatoid arthritis ([place])     Family History  Problem Relation Age of Onset  . Diabetes Father   . Pancreatic cancer Father   . Hypertension Brother    Past Surgical History:  Procedure Laterality Date  . BI-VENTRICULAR IMPLANTABLE CARDIOVERTER DEFIBRILLATOR N/A 07/28/2012   Procedure: BI-VENTRICULAR IMPLANTABLE CARDIOVERTER DEFIBRILLATOR  (CRT-D);  Surgeon: Evans Lance, MD;  [facility] CATH LAB;  Service: Cardiovascular;  Laterality: N/A;  . BI-VENTRICULAR IMPLANTABLE  CARDIOVERTER DEFIBRILLATOR  (CRT-D)  07/28/2012  . BREAST BIOPSY Bilateral   . CATARACT EXTRACTION W/ INTRAOCULAR LENS IMPLANT Left   . CATARACT EXTRACTION W/PHACO Right 09/15/2013   Procedure: CATARACT EXTRACTION PHACO AND INTRAOCULAR LENS PLACEMENT (IOC);  Surgeon: Elta Guadeloupe T. Gershon Crane, MD;  Location: [facility] ORS;  Service: Ophthalmology;  Laterality: Right;  CDE:  10.74  . COLONOSCOPY N/A 03/04/2015   Procedure: COLONOSCOPY;  Surgeon: Rogene Houston, MD;  Location: [facility] ENDO SUITE;  Service: Endoscopy;  Laterality: N/A;  10:50   . ESOPHAGOGASTRODUODENOSCOPY N/A 03/04/2015   Procedure: ESOPHAGOGASTRODUODENOSCOPY (EGD);  Surgeon: Rogene Houston, MD;  Location: [facility] END[facility] SU[facility];  Service: Endoscopy;  Laterality: N/A;  . ESOPHAGOGASTRODUODENOSCOPY N/A 09/21/2016   Procedure: ESOPHAGOGASTRODUODENOSCOPY (EGD);  Surgeon: Rogene Houston, MD;  Location: [facility];  Service: Endoscopy;  Laterality: N/A;  . GIVENS CAPSULE STUDY N/A 09/22/2016   Procedure: GIVENS CAPSULE STUDY;  Surgeon: Rogene Houston, MD;  Location: [facility];  Service: Endoscopy;  Laterality: N/A;  . MASTECTOMY Left 1998  . TOTAL KNEE ARTHROPLASTY Right    Dr.Harrison  . TUBAL LIGATION     Social History   Social History Narrative   Married, lives with spouse   1 daughter, died in 96 in a car accident   Retired - worked in [employer]   No recent travel     Objective: Vital Signs: BP (!) 115/47 (BP Location: Right Arm, Patient Position: Sitting, Cuff  Size: Normal)   Pulse 81   Resp 18   Ht '5\' 5"'  (1.651 m)   Wt 148 lb (67.1 kg)   BMI 24.63 kg/m    Physical Exam  Constitutional: She is oriented to person, place, and time. She appears well-developed and well-nourished.  HENT:  Head: Normocephalic and atraumatic.  Eyes: Conjunctivae and EOM are normal.  Neck: Normal range of motion.  Cardiovascular: Normal rate, regular rhythm, normal heart sounds and intact distal pulses.   Pulmonary/Chest: Effort normal and breath sounds  normal.  Abdominal: Soft. Bowel sounds are normal.  Lymphadenopathy:    She has no cervical adenopathy.  Neurological: She is alert and oriented to person, place, and time.  Skin: Skin is warm and dry. Capillary refill takes less than 2 seconds.  Psychiatric: She has a normal mood and affect. Her behavior is normal.  Nursing note and vitals reviewed.    Musculoskeletal Exam: C-spine and thoracic lumbar spine good range of motion. Shoulder joints elbow joints are good range of motion. She has limited range of motion of bilateral wrist joints with synovitis. She synovitis over bilateral MCP joints as described below. Her right total knee replacement is doing well.  CDAI Exam: CDAI Homunculus Exam:   Tenderness:  RUE: wrist Right hand: 3rd MCP Left hand: 2nd MCP and 3rd MCP  Swelling:  RUE: wrist Right hand: 3rd MCP Left hand: 3rd MCP  Joint Counts:  CDAI Tender Joint count: 4 CDAI Swollen Joint count: 3  Global Assessments:  Patient Global Assessment: 5 Provider Global Assessment: 5  CDAI Calculated Score: 17    Investigation: No additional findings.PLQ eye exam: 02/2016,  Uric Acid: 13.0 in 06/2016 CBC Latest Ref Rng & Units 11/29/2016 10/05/2016 09/25/2016  WBC 4.0 - 10.5 K/uL 4.0 - 7.2  Hemoglobin 12.0 - 15.0 g/dL 8.7(L) 9.2(L) 8.3(L)  Hematocrit 36.0 - 46.0 % 27.9(L) 29.4(L) 26.3(L)  Platelets 150 - 400 K/uL 180 - 222   CMP Latest Ref Rng & Units 11/29/2016 10/23/2016 10/15/2016  Glucose 65 - 99 mg/dL 104(H) 114(H) 108(H)  BUN 6 - 20 mg/dL 30(H) 31(H) 18  Creatinine 0.44 - 1.00 mg/dL 1.68(H) 2.08(H) 0.99  Sodium 135 - 145 mmol/L 138 136 136  Potassium 3.5 - 5.1 mmol/L 4.1 5.3(H) 3.0(L)  Chloride 101 - 111 mmol/L 105 99(L) 99(L)  CO2 22 - 32 mmol/L '24 25 27  ' Calcium 8.9 - 10.3 mg/dL 9.2 9.0 8.3(L)  Total Protein 6.5 - 8.1 g/dL 7.9 - -  Total Bilirubin 0.3 - 1.2 mg/dL 0.4 - -  Alkaline Phos 38 - 126 U/L 100 - -  AST 15 - 41 U/L 36 - -  ALT 14 - 54 U/L 20 - -     Imaging: No results found.  Speciality Comments: No specialty comments available.    Procedures:  No procedures performed Allergies: Patient has no known allergies.   Assessment / Plan:     Visit Diagnoses: Rheumatoid arthritis involving multiple sites with positive rheumatoid factor (HCC) - +RF, +CCP, Her methotrexate was discontinued due to low GFR . She is on Plaquenil one tablet for 5 days a week. She's having a flare due to coming off all the medications. She states she had to come off medications due to GI bleed. She has resumed her Plaquenil now. She still have some synovitis on examination.  High risk medication use - PLQ 100 mg by mouth daily Monday through Friday. eye exam: 02/2016. I will change her Plaquenil to one tablet  by mouth daily. I've also advised her to discuss with Dr. Laural Golden if she can go back on colchicine.  History of total knee replacement, right - Dr. Aline Brochure. Doing well  Idiopathic chronic gout without tophus, unspecified site - allopurinol 100 mg by mouth daily. She denies any recent gout flare. We will check her uric acid with her next labs in December.  Her other medical problems are listed as follows:  Chronic kidney disease, unspecified CKD stage - stage G3b/A1 - GFR 32, GFR stable  Chronic systolic heart failure (HCC)  Anemia of chronic disease  Dyslipidemia  Malignant neoplasm of female breast, unspecified estrogen receptor status, unspecified laterality, unspecified site of breast ([place]) -  Dr.Penland  Elevated sedimentation rate - ESR> 100 followed by oncologist     Orders: Orders Placed This Encounter  Procedures  . Uric acid   No orders of the defined types were placed in this encounter.   Face-to-face time spent with patient was 30 minutes. Greater than 50% of time was spent in counseling and coordination of care.  Follow-Up Instructions: Return in about 6 months (around 07/11/2017) for Rheumatoid arthritis, Gout.   Bo Merino, MD  Note - This record has been created using Editor, commissioning.  Chart creation errors have been sought, but may not always  have been located. Such creation errors do not reflect on  the standard of medical care.

## 2017-01-07 ENCOUNTER — Ambulatory Visit (INDEPENDENT_AMBULATORY_CARE_PROVIDER_SITE_OTHER): Payer: Self-pay

## 2017-01-07 ENCOUNTER — Telehealth: Payer: Self-pay

## 2017-01-07 DIAGNOSIS — Z95810 Presence of automatic (implantable) cardiac defibrillator: Secondary | ICD-10-CM

## 2017-01-07 DIAGNOSIS — I5022 Chronic systolic (congestive) heart failure: Secondary | ICD-10-CM

## 2017-01-07 NOTE — Telephone Encounter (Signed)
Remote ICM transmission received.  Attempted call to nephew, [name] and left detailed message to return call regarding transmission.

## 2017-01-07 NOTE — Progress Notes (Signed)
EPIC Encounter for ICM Monitoring  Patient Name: [name] is a 76 y.o. female Date: 01/07/2017 Primary Care Physican: [name], MD Primary Cardiologist:Koneswaran/Lawrence NP Electrophysiologist: Lovena Le Dry Weight:unknown Bi-V Pacing: 94.7%      Call to nephew, [name].  Patient having some shortness of breath with normal activities.    Thoracic impedance abnormal suggesting fluid accumulation since ~11/21/2016.  Prescribed dosage: Furosemide 40 mg 1 tablet (40 mg total) every AM and 0.5 tablet (20 mg total) every PM.Klor Con 20 mEq 2tablets (40 mEq)daily.  Labs: 11/29/2016 Creatinine 1.68, BUN 30, Potassium 4.1, Sodium 138, EGFR 28-33 10/23/2016 Creatinine 2.08 BUN 31, Potassium 5.3, Sodium 136, EGFR 22-25 10/15/2016 Creatinine 0.99, BUN 18, Potassium 3.0, Sodium 136, EGFR 53->60 10/08/2016 Creatinine 0.95, BUN 16, Potassium 3.1, Sodium 140, EGFR 56->60 09/23/2016 Creatinine 0.74, BUN 13, Potassium 4.0, Sodium 141, EGFR >60 09/22/2016 Creatinine 0.90, BUN 15, Potassium 3.9, Sodium 141, EGFR >60  09/21/2016 Creatinine 1.05, BUN 26, Potassium 3.7, Sodium 140, EGFR 50-57  09/20/2016 Creatinine 1.53, BUN 37, Potassium 3.9, Sodium 140, EGFR 31-36 09/05/2016 Creatinine 1.25, BUN 29, Potassium 4.0, Sodium 137, EGFR 40-47  08/08/2016 Creatinine 1.10, BUN 24, Potassium 3.6, Sodium 136, EGFR 47-55 07/19/2016 Creatinine 1.26, BUN 29, Potassium 3.6, Sodium 139, EGFR 40-46 06/22/2016 Creatinine 1.44, BUN 37, Potassium 3.7, Sodium 140, EGFR 34-39 06/01/2016 Creatinine 1.68, BUN 43, Potassium 4.7, Sodium 132, EGFR 28-33  Recommendations:  Copy of ICM check sent to Dr. [name] and Dr Lovena Le for review and recommendations if needed.      Follow-up plan: ICM clinic phone appointment on 01/14/2017 to recheck fluid levels.  Office appointment scheduled 02/20/2017 with Dr. [name].  3 month ICM trend: 01/07/2017    1 Year ICM trend:      [name],  RN 01/07/2017 10:21 AM

## 2017-01-08 DIAGNOSIS — H35373 Puckering of macula, bilateral: Secondary | ICD-10-CM | POA: Diagnosis not present

## 2017-01-08 DIAGNOSIS — H353132 Nonexudative age-related macular degeneration, bilateral, intermediate dry stage: Secondary | ICD-10-CM | POA: Diagnosis not present

## 2017-01-08 DIAGNOSIS — H43813 Vitreous degeneration, bilateral: Secondary | ICD-10-CM | POA: Diagnosis not present

## 2017-01-08 DIAGNOSIS — Z79899 Other long term (current) drug therapy: Secondary | ICD-10-CM | POA: Diagnosis not present

## 2017-01-09 NOTE — Progress Notes (Signed)
Increase Lasix to 80 mg BID x 3 days. Check BMET in 3 days.

## 2017-01-10 NOTE — Progress Notes (Signed)
Attempted call to [name], nephew.  Left Dr Court Joy instructions on voice mail as he requested.  Advised to increase Lasix to 80 mg twice a day x 3 days and then return to prior dosage of 40 mg AM and 20 mg PM daily. Advised to have BMET drawn on 01/14/2017 at [facility] if convenient.  Requested he call back and confirm all the information and that she will be able to have labs drawn.  Provided call back number.

## 2017-01-11 ENCOUNTER — Encounter: Payer: Self-pay | Admitting: Rheumatology

## 2017-01-11 ENCOUNTER — Ambulatory Visit (INDEPENDENT_AMBULATORY_CARE_PROVIDER_SITE_OTHER): Payer: Medicare HMO | Admitting: Rheumatology

## 2017-01-11 VITALS — BP 115/47 | HR 81 | Resp 18 | Ht 65.0 in | Wt 148.0 lb

## 2017-01-11 DIAGNOSIS — D638 Anemia in other chronic diseases classified elsewhere: Secondary | ICD-10-CM

## 2017-01-11 DIAGNOSIS — C50919 Malignant neoplasm of unspecified site of unspecified female breast: Secondary | ICD-10-CM

## 2017-01-11 DIAGNOSIS — I5022 Chronic systolic (congestive) heart failure: Secondary | ICD-10-CM

## 2017-01-11 DIAGNOSIS — E785 Hyperlipidemia, unspecified: Secondary | ICD-10-CM | POA: Diagnosis not present

## 2017-01-11 DIAGNOSIS — N189 Chronic kidney disease, unspecified: Secondary | ICD-10-CM

## 2017-01-11 DIAGNOSIS — Z96651 Presence of right artificial knee joint: Secondary | ICD-10-CM | POA: Diagnosis not present

## 2017-01-11 DIAGNOSIS — M1A00X0 Idiopathic chronic gout, unspecified site, without tophus (tophi): Secondary | ICD-10-CM | POA: Diagnosis not present

## 2017-01-11 DIAGNOSIS — M0579 Rheumatoid arthritis with rheumatoid factor of multiple sites without organ or systems involvement: Secondary | ICD-10-CM

## 2017-01-11 DIAGNOSIS — Z79899 Other long term (current) drug therapy: Secondary | ICD-10-CM | POA: Diagnosis not present

## 2017-01-11 DIAGNOSIS — R700 Elevated erythrocyte sedimentation rate: Secondary | ICD-10-CM

## 2017-01-11 NOTE — Patient Instructions (Addendum)
Take hydroxychloroquine 200 mg 1 tablet every day  Discussed with Dr. Laural Golden if she can resume colchicine  Standing Labs We placed an order today for your standing lab work.    Please come back and get your standing labs in December and every 3 months. Uric acid should be drawn in December.  We have open lab Monday through Friday from 8:30-11:30 AM and 1:30-4 PM at the office of Dr. Bo Merino.   The office is located at [address], [place], [place], [place] [address] No appointment is necessary.   Labs are drawn by [facility].  You may receive a bill from [facility] for your lab work. If you have any questions regarding directions or hours of operation,  please call [phone].   Please a schedule eye exam in November

## 2017-01-11 NOTE — Progress Notes (Signed)
Robert returned call.  He verbalized understanding to increase Furosemide to 80 mg twice a day x 3 days and then return to prior dosage of 40 mg every AM and 20 mg every PM.  She will have BMET drawn at [facility] on 01/14/2017 and next ICM remote transmission 9/24 to recheck fluid levels.

## 2017-01-14 ENCOUNTER — Ambulatory Visit (INDEPENDENT_AMBULATORY_CARE_PROVIDER_SITE_OTHER): Payer: Medicare HMO

## 2017-01-14 ENCOUNTER — Telehealth: Payer: Self-pay

## 2017-01-14 DIAGNOSIS — Z95810 Presence of automatic (implantable) cardiac defibrillator: Secondary | ICD-10-CM

## 2017-01-14 DIAGNOSIS — I5022 Chronic systolic (congestive) heart failure: Secondary | ICD-10-CM

## 2017-01-14 NOTE — Progress Notes (Signed)
EPIC Encounter for ICM Monitoring  Patient Name: [name] is a 76 y.o. female Date: 01/14/2017 Primary Care Physican: [name], MD Primary Cardiologist:Koneswaran/Lawrence NP Electrophysiologist: Lovena Le Dry Weight:unknown Bi-V Pacing: 95.2%      Attempted call to nephew [name].  Transmission reviewed and left detailed message regarding transmission.    Thoracic impedance almost at baseline normal after taking increased Lasix of 80 mg BID x 3 days.   Prescribed dosage: Furosemide 40 mg 1 tablet (40 mg total) every AM and 0.5 tablet (20 mg total) every PM.Klor Con 20 mEq 2tablets (40 mEq)daily.  Labs:  Scheduled to have BMET drawn today - 01-14-17 11/29/2016 Creatinine 1.68, BUN 30, Potassium 4.1, Sodium 138, EGFR 28-33 10/23/2016 Creatinine 2.08 BUN 31, Potassium 5.3, Sodium 136, EGFR 22-25 10/15/2016 Creatinine 0.99, BUN 18, Potassium 3.0, Sodium 136, EGFR 53->60 10/08/2016 Creatinine 0.95, BUN 16, Potassium 3.1, Sodium 140, EGFR 56->60 09/23/2016 Creatinine 0.74, BUN 13, Potassium 4.0, Sodium 141, EGFR >60 09/22/2016 Creatinine 0.90, BUN 15, Potassium 3.9, Sodium 141, EGFR >60  09/21/2016 Creatinine 1.05, BUN 26, Potassium 3.7, Sodium 140, EGFR 50-57  09/20/2016 Creatinine 1.53, BUN 37, Potassium 3.9, Sodium 140, EGFR 31-36 09/05/2016 Creatinine 1.25, BUN 29, Potassium 4.0, Sodium 137, EGFR 40-47  08/08/2016 Creatinine 1.10, BUN 24, Potassium 3.6, Sodium 136, EGFR 47-55 07/19/2016 Creatinine 1.26, BUN 29, Potassium 3.6, Sodium 139, EGFR 40-46 06/22/2016 Creatinine 1.44, BUN 37, Potassium 3.7, Sodium 140, EGFR 34-39 06/01/2016 Creatinine 1.68, BUN 43, Potassium 4.7, Sodium 132, EGFR 28-33  Recommendations: Left voice mail with ICM number and encouraged to call for fluid symptoms.  Follow-up plan: ICM clinic phone appointment on 02/14/2017.  Office appointment scheduled 02/20/2017 with Dr. [name].  Copy of ICM check sent to Dr. [name] and Dr.  Lovena Le.   3 month ICM trend: 01/14/2017   1 Year ICM trend:      [name], RN 01/14/2017 10:39 AM

## 2017-01-14 NOTE — Telephone Encounter (Signed)
Remote ICM transmission received.  Attempted call to nephew and left detailed message regarding transmission and next ICM scheduled for 02/12/2017.  Advised to return call for any fluid symptoms or questions.

## 2017-01-15 ENCOUNTER — Other Ambulatory Visit (HOSPITAL_COMMUNITY)
Admission: RE | Admit: 2017-01-15 | Discharge: 2017-01-15 | Disposition: A | Discharge status: Home / Self Care | Payer: Medicare HMO | Source: Ambulatory Visit | Attending: Cardiovascular Disease | Admitting: Cardiovascular Disease

## 2017-01-15 DIAGNOSIS — I5022 Chronic systolic (congestive) heart failure: Secondary | ICD-10-CM | POA: Diagnosis not present

## 2017-01-15 LAB — BASIC METABOLIC PANEL
ANION GAP: 11 (ref 5–15)
BUN: 37 mg/dL — ABNORMAL HIGH (ref 6–20)
CO2: 26 mmol/L (ref 22–32)
Calcium: 9.5 mg/dL (ref 8.9–10.3)
Chloride: 101 mmol/L (ref 101–111)
Creatinine, Ser: 1.56 mg/dL — ABNORMAL HIGH (ref 0.44–1.00)
GFR calc Af Amer: 36 mL/min — ABNORMAL LOW (ref >60)
GFR, EST NON AFRICAN AMERICAN: 31 mL/min — AB (ref >60)
GLUCOSE: 107 mg/dL — AB (ref 65–99)
POTASSIUM: 4.4 mmol/L (ref 3.5–5.1)
Sodium: 138 mmol/L (ref 135–145)

## 2017-01-16 ENCOUNTER — Telehealth: Payer: Self-pay | Admitting: Cardiovascular Disease

## 2017-01-16 NOTE — Telephone Encounter (Signed)
Please give pt's nephew [name] a call @ [phone] --came by to speak w/ Alda Berthold about lab results

## 2017-01-16 NOTE — Telephone Encounter (Signed)
Returned call. No answer, left message for [name] to call back.

## 2017-01-16 NOTE — Telephone Encounter (Signed)
Nephew notified of test results

## 2017-02-14 ENCOUNTER — Telehealth: Payer: Self-pay

## 2017-02-14 ENCOUNTER — Ambulatory Visit (INDEPENDENT_AMBULATORY_CARE_PROVIDER_SITE_OTHER): Payer: Medicare HMO | Admitting: *Deleted

## 2017-02-14 DIAGNOSIS — I5022 Chronic systolic (congestive) heart failure: Secondary | ICD-10-CM

## 2017-02-14 DIAGNOSIS — Z95810 Presence of automatic (implantable) cardiac defibrillator: Secondary | ICD-10-CM | POA: Diagnosis not present

## 2017-02-14 DIAGNOSIS — I255 Ischemic cardiomyopathy: Secondary | ICD-10-CM | POA: Diagnosis not present

## 2017-02-14 NOTE — Telephone Encounter (Signed)
Remote ICM transmission received.  Attempted call to nephew [name] and left detailed message per [facility] regarding transmission and next ICM scheduled for 03/21/2017.  Advised to return call for any fluid symptoms or questions.

## 2017-02-14 NOTE — Progress Notes (Signed)
Remote ICD transmission.   

## 2017-02-14 NOTE — Progress Notes (Signed)
EPIC Encounter for ICM Monitoring  Patient Name: [name] is a 76 y.o. female Date: 02/14/2017 Primary Care Physican: [name], MD Primary Cardiologist:Koneswaran/Lawrence NP Electrophysiologist: Lovena Le Dry Weight:unknown Bi-V Pacing: 90.6%             Attempted call to nephew [name].  Transmission reviewed and left detailed message regarding transmission.    Thoracic impedance normal since 01/26/2017.  Prescribed dosage: Furosemide 40 mg 1 tablet (40 mg total) every AM and 0.5 tablet (20 mg total) every PM.Klor Con 20 mEq 2tablets (40 mEq)daily.  Labs:  Scheduled to have BMET drawn today - 01-14-17 11/29/2016 Creatinine 1.68, BUN 30, Potassium 4.1, Sodium 138, EGFR 28-33 10/23/2016 Creatinine 2.08 BUN 31, Potassium 5.3, Sodium 136, EGFR 22-25 10/15/2016 Creatinine 0.99, BUN 18, Potassium 3.0, Sodium 136, EGFR 53->60 10/08/2016 Creatinine 0.95, BUN 16, Potassium 3.1, Sodium 140, EGFR 56->60 09/23/2016 Creatinine 0.74, BUN 13, Potassium 4.0, Sodium 141, EGFR >60 09/22/2016 Creatinine 0.90, BUN 15, Potassium 3.9, Sodium 141, EGFR >60  09/21/2016 Creatinine 1.05, BUN 26, Potassium 3.7, Sodium 140, EGFR 50-57  09/20/2016 Creatinine 1.53, BUN 37, Potassium 3.9, Sodium 140, EGFR 31-36 09/05/2016 Creatinine 1.25, BUN 29, Potassium 4.0, Sodium 137, EGFR 40-47  08/08/2016 Creatinine 1.10, BUN 24, Potassium 3.6, Sodium 136, EGFR 47-55 07/19/2016 Creatinine 1.26, BUN 29, Potassium 3.6, Sodium 139, EGFR 40-46 06/22/2016 Creatinine 1.44, BUN 37, Potassium 3.7, Sodium 140, EGFR 34-39 06/01/2016 Creatinine 1.68, BUN 43, Potassium 4.7, Sodium 132, EGFR 28-33  Recommendations: Left voice mail with ICM number and encouraged to call if experiencing any fluid symptoms.  Follow-up plan: ICM clinic phone appointment on 03/21/2017.  Office appointment scheduled 02/20/2017 with Dr. [name].  Copy of ICM check sent to Dr. Lovena Le and Dr. [name].   3 month ICM trend:  02/14/2017   1 Year ICM trend:      Savannah Billings, RN 02/14/2017 4:18 PM

## 2017-02-15 ENCOUNTER — Encounter: Payer: Self-pay | Admitting: Cardiology

## 2017-02-15 LAB — CUP PACEART REMOTE DEVICE CHECK
Battery Voltage: 2.96 V
Brady Statistic AP VP Percent: 8.63 %
Brady Statistic AS VP Percent: 89.34 %
Brady Statistic RA Percent Paced: 9.02 %
Date Time Interrogation Session: 20181025052507
HIGH POWER IMPEDANCE MEASURED VALUE: 83 Ohm
Implantable Lead Implant Date: 20140407
Implantable Lead Location: 753858
Implantable Lead Location: 753859
Implantable Lead Location: 753860
Implantable Lead Model: 4194
Lead Channel Impedance Value: 380 Ohm
Lead Channel Impedance Value: 399 Ohm
Lead Channel Impedance Value: 665 Ohm
Lead Channel Impedance Value: 893 Ohm
Lead Channel Pacing Threshold Amplitude: 0.5 V
Lead Channel Pacing Threshold Amplitude: 0.875 V
Lead Channel Pacing Threshold Pulse Width: 0.4 ms
Lead Channel Sensing Intrinsic Amplitude: 2.25 mV
Lead Channel Setting Pacing Amplitude: 2 V
Lead Channel Setting Pacing Amplitude: 2.5 V
Lead Channel Setting Sensing Sensitivity: 0.3 mV
MDC IDC LEAD IMPLANT DT: 20140407
MDC IDC LEAD IMPLANT DT: 20140407
MDC IDC MSMT BATTERY REMAINING LONGEVITY: 54 mo
MDC IDC MSMT LEADCHNL LV IMPEDANCE VALUE: 342 Ohm
MDC IDC MSMT LEADCHNL LV PACING THRESHOLD AMPLITUDE: 0.5 V
MDC IDC MSMT LEADCHNL LV PACING THRESHOLD PULSEWIDTH: 0.5 ms
MDC IDC MSMT LEADCHNL RA PACING THRESHOLD PULSEWIDTH: 0.4 ms
MDC IDC MSMT LEADCHNL RA SENSING INTR AMPL: 2.25 mV
MDC IDC MSMT LEADCHNL RV IMPEDANCE VALUE: 494 Ohm
MDC IDC MSMT LEADCHNL RV SENSING INTR AMPL: 25.125 mV
MDC IDC MSMT LEADCHNL RV SENSING INTR AMPL: 25.125 mV
MDC IDC PG IMPLANT DT: 20140407
MDC IDC SET LEADCHNL LV PACING AMPLITUDE: 1.5 V
MDC IDC SET LEADCHNL LV PACING PULSEWIDTH: 0.5 ms
MDC IDC SET LEADCHNL RV PACING PULSEWIDTH: 0.4 ms
MDC IDC STAT BRADY AP VS PERCENT: 0.56 %
MDC IDC STAT BRADY AS VS PERCENT: 1.47 %
MDC IDC STAT BRADY RV PERCENT PACED: 9.02 %

## 2017-02-19 ENCOUNTER — Telehealth: Payer: Self-pay

## 2017-02-19 NOTE — Telephone Encounter (Signed)
Left message to advise [name] that lab orders are in for patient.

## 2017-02-19 NOTE — Telephone Encounter (Signed)
Patient's nephew [name] called and would like for lab orders to be sent to [facility] for lab work for patient. CB# is [phone].  Please Advise.  Thank you.

## 2017-02-20 ENCOUNTER — Encounter: Payer: Self-pay | Admitting: Cardiovascular Disease

## 2017-02-20 ENCOUNTER — Ambulatory Visit (INDEPENDENT_AMBULATORY_CARE_PROVIDER_SITE_OTHER): Payer: Medicare HMO | Admitting: Cardiovascular Disease

## 2017-02-20 VITALS — BP 114/58 | HR 68 | Ht 65.0 in | Wt 138.4 lb

## 2017-02-20 DIAGNOSIS — I255 Ischemic cardiomyopathy: Secondary | ICD-10-CM

## 2017-02-20 DIAGNOSIS — Z95810 Presence of automatic (implantable) cardiac defibrillator: Secondary | ICD-10-CM | POA: Diagnosis not present

## 2017-02-20 DIAGNOSIS — I10 Essential (primary) hypertension: Secondary | ICD-10-CM | POA: Diagnosis not present

## 2017-02-20 DIAGNOSIS — D649 Anemia, unspecified: Secondary | ICD-10-CM

## 2017-02-20 DIAGNOSIS — I5042 Chronic combined systolic (congestive) and diastolic (congestive) heart failure: Secondary | ICD-10-CM | POA: Diagnosis not present

## 2017-02-20 DIAGNOSIS — I25118 Atherosclerotic heart disease of native coronary artery with other forms of angina pectoris: Secondary | ICD-10-CM | POA: Diagnosis not present

## 2017-02-20 NOTE — Patient Instructions (Signed)

## 2017-02-20 NOTE — Progress Notes (Signed)
SUBJECTIVE: The patient presents for routine follow-up.  I have not seen her since May 2016.  She saw K.  Lawrence DNP on 11/20/16.  She has a history of chronic systolic heart failure and has a BiV ICD.  Thoracic impedance was normal when last evaluated on 02/14/17. She also has coronary artery disease with a remote history of myocardial infarction. Most recent device interrogation performed on 02/14/17 demonstrated normal device function.  She has anemia with most recent hemoglobin of 8.7 on 11/29/16.  She seldom has chest tightness.  She denies shortness of breath, leg swelling, lightheadedness, dizziness, and falls.  She went to [facility] on 11/08/16 for upper endoscopy.  She was found to have angiodysplastic lesions in the jejunum she was given argon plasma.  Esophagus and stomach were normal.  Echocardiogram 07/01/15 showed moderately reduced left ventricular systolic function, LVEF 81-19%, mild LVH, grade 1 diastolic dysfunction with elevated filling pressures, and mild mitral and tricuspid regurgitation.  She has not seen Dr. Lovena Le for BiV ICD follow-up in some time.  Review of Systems: As per "subjective", otherwise negative.  No Known Allergies  Current Outpatient Prescriptions  Medication Sig Dispense Refill  . allopurinol (ZYLOPRIM) 100 MG tablet TAKE 1 TABLET (100 MG TOTAL) BY MOUTH DAILY. 90 tablet 0  . carvedilol (COREG) 6.25 MG tablet TAKE 1 TABLET BY MOUTH TWICE A DAY WITH MEALS 60 tablet 6  . furosemide (LASIX) 40 MG tablet Take 40 mg am and take 20 mg (1/2 tablet) in pm 135 tablet 3  . hydroxychloroquine (PLAQUENIL) 200 MG tablet TAKE 1 TABLET (200 MG TOTAL) BY MOUTH DAILY. MONDAY [place] (Patient taking differently: TAKE 1 TABLET (200 MG TOTAL) BY MOUTH DAILY.) 65 tablet 0  . lisinopril (PRINIVIL,ZESTRIL) 20 MG tablet Take 10 mg by mouth daily.     [name] lovastatin (MEVACOR) 40 MG tablet TAKE 1 TABLET (40 MG TOTAL) BY MOUTH AT BEDTIME. 30 tablet 6  .  magnesium oxide (MAG-OX) 400 MG tablet Take 1 tablet (400 mg total) by mouth at bedtime. 90 tablet 3  . pantoprazole (PROTONIX) 20 MG tablet Take 1 tablet (20 mg total) by mouth daily. 90 tablet 3  . potassium chloride SA (K-DUR,KLOR-CON) 20 MEQ tablet Take 2 tablets (40 mEq total) by mouth daily. 180 tablet 3   No current facility-administered medications for this visit.     Past Medical History:  Diagnosis Date  . Anemia    Hgb of 9-10  . Anemia of chronic disease    Hgb of 9-10 chronically; 06/2010: H&H-10.7/33.5, MCV-81, normal iron studies in 2010   . Arteriosclerotic cardiovascular disease (ASCVD)    Remote PTCA by patient report; LBBB; associated cardiomyopathy, presumed ischemic with EF 40-45% previously, 20% in 06/2009; h/o clinical congestive heart failure; negative stress nuclear in 2009 with inferoseptal and apical scar  . Automatic implantable cardioverter-defibrillator in situ   . Breast cancer ([place])    breast  . Chronic bronchitis ([place])   . Chronic renal disease, stage 3, moderately decreased glomerular filtration rate (GFR) between 30-59 mL/min/1.73 square meter (HCC) 02/01/2016  . Congestive heart failure (CHF) ([place])   . Elevated cholesterol   . Elevated sed rate   . GERD (gastroesophageal reflux disease)   . GI bleed   . Gout   . HOH (hard of hearing)   . Hyperlipidemia   . Hypertension   . Rheumatoid arthritis [facility])     Past Surgical History:  Procedure Laterality Date  .  BI-VENTRICULAR IMPLANTABLE CARDIOVERTER DEFIBRILLATOR N/A 07/28/2012   Procedure: BI-VENTRICULAR IMPLANTABLE CARDIOVERTER DEFIBRILLATOR  (CRT-D);  Surgeon: Evans Lance, MD;  Location: [facility] CATH LAB;  Service: Cardiovascular;  Laterality: N/A;  . BI-VENTRICULAR IMPLANTABLE CARDIOVERTER DEFIBRILLATOR  (CRT-D)  07/28/2012  . BREAST BIOPSY Bilateral   . CATARACT EXTRACTION W/ INTRAOCULAR LENS IMPLANT Left   . CATARACT EXTRACTION W/PHACO Right 09/15/2013   Procedure: CATARACT EXTRACTION PHACO AND  INTRAOCULAR LENS PLACEMENT (IOC);  Surgeon: Elta Guadeloupe T. Gershon Crane, MD;  Location: [facility] ORS;  Service: Ophthalmology;  Laterality: Right;  CDE:  10.74  . COLONOSCOPY N/A 03/04/2015   Procedure: COLONOSCOPY;  Surgeon: Rogene Houston, MD;  Location: [facility] ENDO SUITE;  Service: Endoscopy;  Laterality: N/A;  10:50   . ESOPHAGOGASTRODUODENOSCOPY N/A 03/04/2015   Procedure: ESOPHAGOGASTRODUODENOSCOPY (EGD);  Surgeon: Rogene Houston, MD;  Location: [facility] ENDO SUITE;  Service: Endoscopy;  Laterality: N/A;  . ESOPHAGOGASTRODUODENOSCOPY N/A 09/21/2016   Procedure: ESOPHAGOGASTRODUODENOSCOPY (EGD);  Surgeon: Rogene Houston, MD;  Location: [facility] ENDO SUITE;  Service: Endoscopy;  Laterality: N/A;  . GIVENS CAPSULE STUDY N/A 09/22/2016   Procedure: GIVENS CAPSULE STUDY;  Surgeon: Rogene Houston, MD;  Location: [facility] ENDO SUITE;  Service: Endoscopy;  Laterality: N/A;  . MASTECTOMY Left 1998  . TOTAL KNEE ARTHROPLASTY Right    Dr.Harrison  . TUBAL LIGATION      Social History   Social History  . Marital status: Married    Spouse name: N/A  . Number of children: N/A  . Years of education: N/A   Occupational History  . Retired    Social History Main Topics  . Smoking status: Former Smoker    Packs/day: 0.25    Years: 30.00    Types: Cigarettes    Start date: 09/17/1956    Quit date: 04/24/2007  . Smokeless tobacco: Current User    Types: Chew     Comment: 07/03/2013 "smoked some; don't know how much or how long or when I quit"  . Alcohol use No  . Drug use: No  . Sexual activity: Not Currently    Birth control/ protection: Post-menopausal   Other Topics Concern  . Not on file   Social History Narrative   Married, lives with spouse   1 daughter, died in 6 in a car accident   Retired - worked in [employer] fundraiser   No recent travel     [place]:   02/20/17 1357  BP: (!) 114/58  Pulse: 68  SpO2: 99%  Weight: 138 lb 6.4 oz (62.8 kg)  Height: 5\' 5"  (1.651 m)    Wt Readings from Last 3 Encounters:  02/20/17  138 lb 6.4 oz (62.8 kg)  01/11/17 148 lb (67.1 kg)  12/04/16 138 lb 8 oz (62.8 kg)     PHYSICAL EXAM General: NAD HEENT: Normal. Neck: No JVD, no thyromegaly. Lungs: Clear to auscultation bilaterally with normal respiratory effort. CV: Regular rate and rhythm, normal S1/S2, no O2/V0, 2/6 systolic murmur heard throughout precordium. No pretibial or periankle edema.     Abdomen: Soft, nontender, no distention.  Neurologic: Alert and oriented.  Psych: Normal affect. Skin: Normal. Musculoskeletal: No gross deformities.    ECG: Most recent ECG reviewed.   Labs: Lab Results  Component Value Date/Time   K 4.4 01/15/2017 03:10 PM   BUN 37 (H) 01/15/2017 03:10 PM   CREATININE 1.56 (H) 01/15/2017 03:10 PM   CREATININE 1.23 (H) 09/17/2014 01:47 PM   ALT 20 11/29/2016 11:55 AM   TSH 0.501 07/03/2013  06:40 PM   HGB 8.7 (L) 11/29/2016 11:55 AM     Lipids: Lab Results  Component Value Date/Time   LDLCALC 83 05/23/2012 09:05 AM   CHOL 134 05/23/2012 09:05 AM   TRIG 103 05/23/2012 09:05 AM   HDL 30 (L) 05/23/2012 09:05 AM       ASSESSMENT AND PLAN: 1.  Chronic systolic heart failure: Euvolemic and stable.  Weight down 10 pounds since 01/11/17.  Continue carvedilol, Lasix, and lisinopril at present doses.  She has a BiV ICD.  2.  Coronary artery disease with remote history of MI: Symptomatically stable.  Continue lovastatin, lisinopril, and carvedilol at present doses.  She is presumably not on aspirin due to anemia.  Most recent upper and small bowel endoscopy results reviewed above.  She had angiodysplastic lesions in the jejunum.  3.  Biventricular ICD: Device interrogation reviewed above.  There is normal device function.  She is overdue for follow-up with Dr. Lovena Le.  I will make certain she has an appointment in the near future.  4. Anemia: She went to [facility] on 11/08/16 for upper endoscopy.  She was found to have angiodysplastic lesions in the jejunum she was given  argon plasma.  Esophagus and stomach were normal.   Disposition: Follow up 6 months.  Time spent: 40 minutes, of which greater than 50% was spent reviewing symptoms, relevant blood tests and studies, and discussing management plan with the patient.    Kate Sable, M.D., F.A.C.C.

## 2017-03-05 ENCOUNTER — Encounter (HOSPITAL_COMMUNITY): Payer: Medicare HMO | Attending: Adult Health

## 2017-03-05 ENCOUNTER — Encounter (HOSPITAL_COMMUNITY): Payer: Medicare HMO | Attending: Oncology | Admitting: Oncology

## 2017-03-05 ENCOUNTER — Other Ambulatory Visit: Payer: Self-pay

## 2017-03-05 ENCOUNTER — Encounter (HOSPITAL_COMMUNITY): Payer: Self-pay | Admitting: Oncology

## 2017-03-05 VITALS — BP 125/41 | HR 76 | Temp 98.6°F | Resp 18 | Wt 140.4 lb

## 2017-03-05 DIAGNOSIS — Z853 Personal history of malignant neoplasm of breast: Secondary | ICD-10-CM

## 2017-03-05 DIAGNOSIS — C50412 Malignant neoplasm of upper-outer quadrant of left female breast: Secondary | ICD-10-CM | POA: Diagnosis not present

## 2017-03-05 DIAGNOSIS — C50011 Malignant neoplasm of nipple and areola, right female breast: Secondary | ICD-10-CM

## 2017-03-05 DIAGNOSIS — D509 Iron deficiency anemia, unspecified: Secondary | ICD-10-CM

## 2017-03-05 DIAGNOSIS — N183 Chronic kidney disease, stage 3 unspecified: Secondary | ICD-10-CM

## 2017-03-05 DIAGNOSIS — D508 Other iron deficiency anemias: Secondary | ICD-10-CM

## 2017-03-05 DIAGNOSIS — Z170 Estrogen receptor positive status [ER+]: Secondary | ICD-10-CM | POA: Insufficient documentation

## 2017-03-05 LAB — COMPREHENSIVE METABOLIC PANEL
ALT: 19 U/L (ref 14–54)
AST: 24 U/L (ref 15–41)
Albumin: 3.2 g/dL — ABNORMAL LOW (ref 3.5–5.0)
Alkaline Phosphatase: 95 U/L (ref 38–126)
Anion gap: 10 (ref 5–15)
BUN: 47 mg/dL — AB (ref 6–20)
CHLORIDE: 102 mmol/L (ref 101–111)
CO2: 25 mmol/L (ref 22–32)
CREATININE: 2.27 mg/dL — AB (ref 0.44–1.00)
Calcium: 9.1 mg/dL (ref 8.9–10.3)
GFR calc Af Amer: 23 mL/min — ABNORMAL LOW (ref >60)
GFR calc non Af Amer: 20 mL/min — ABNORMAL LOW (ref >60)
Glucose, Bld: 115 mg/dL — ABNORMAL HIGH (ref 65–99)
Potassium: 5 mmol/L (ref 3.5–5.1)
SODIUM: 137 mmol/L (ref 135–145)
Total Bilirubin: 0.4 mg/dL (ref 0.3–1.2)
Total Protein: 8.1 g/dL (ref 6.5–8.1)

## 2017-03-05 LAB — CBC WITH DIFFERENTIAL/PLATELET
BASOS ABS: 0 10*3/uL (ref 0.0–0.1)
Basophils Relative: 1 %
EOS ABS: 0.2 10*3/uL (ref 0.0–0.7)
EOS PCT: 3 %
HCT: 25.7 % — ABNORMAL LOW (ref 36.0–46.0)
Hemoglobin: 8 g/dL — ABNORMAL LOW (ref 12.0–15.0)
LYMPHS PCT: 25 %
Lymphs Abs: 1.4 10*3/uL (ref 0.7–4.0)
MCH: 26.1 pg (ref 26.0–34.0)
MCHC: 31.1 g/dL (ref 30.0–36.0)
MCV: 83.7 fL (ref 78.0–100.0)
Monocytes Absolute: 0.5 10*3/uL (ref 0.1–1.0)
Monocytes Relative: 9 %
NEUTROS PCT: 62 %
Neutro Abs: 3.5 10*3/uL (ref 1.7–7.7)
PLATELETS: 223 10*3/uL (ref 150–400)
RBC: 3.07 MIL/uL — AB (ref 3.87–5.11)
RDW: 18.3 % — ABNORMAL HIGH (ref 11.5–15.5)
WBC: 5.5 10*3/uL (ref 4.0–10.5)

## 2017-03-05 LAB — IRON AND TIBC
Iron: 26 ug/dL — ABNORMAL LOW (ref 28–170)
Saturation Ratios: 12 % (ref 10.4–31.8)
TIBC: 211 ug/dL — ABNORMAL LOW (ref 250–450)
UIBC: 185 ug/dL

## 2017-03-05 LAB — FERRITIN: Ferritin: 153 ng/mL (ref 11–307)

## 2017-03-05 NOTE — Progress Notes (Signed)
[address], [place] [address]   CLINIC:  Medical Oncology/Hematology  PCP:  [name], MD [place] [address] [phone]   REASON FOR VISIT:  Follow-up for iron-deficiency anemia AND remote h/o left breast cancer (1992)  CURRENT THERAPY: IV iron prn AND observation   BRIEF ONCOLOGIC HISTORY:    Breast cancer ([place])    Initial Diagnosis    Breast cancer        HISTORY OF PRESENT ILLNESS:  (Per Kirby Crigler, PA-C's note on 12/20/15)    INTERVAL HISTORY:  [name] for follow-up of her iron-deficiency anemia and h/o left breast cancer s/p mastectomy and 5 years of Tamoxifen therapy.   Patient presented today for continue follow-up.  She states that she has been doing well.  She has not noted any melena, hematochezia, hematuria.  She has occasional fatigue.  She has some dyspnea on exertion but denies any baseline shortness of breath, chest pain, nausea, vomiting, abdominal pain.  She has not palpated any new masses in her right breast.  REVIEW OF SYSTEMS:  Review of Systems  Constitutional: Positive for fatigue. Negative for appetite change, chills and fever.  HENT:   Negative for hearing loss, lump/mass, mouth sores, sore throat and tinnitus.        Hard of hearing  Eyes: Negative.  Negative for eye problems and icterus.  Respiratory: Negative for chest tightness, cough, hemoptysis, shortness of breath (dyspnea on exertion ) and wheezing.   Cardiovascular: Negative.  Negative for chest pain, leg swelling and palpitations.  Gastrointestinal: Negative for abdominal distention, abdominal pain, blood in stool, constipation, diarrhea, nausea and vomiting.       Melena  Endocrine: Negative.  Negative for hot flashes.  Genitourinary: Negative.  Negative for difficulty urinating, dysuria, frequency, hematuria and vaginal bleeding.   Musculoskeletal: Negative.  Negative for arthralgias and neck  pain.  Skin: Negative.  Negative for itching and rash.  Neurological: Negative for dizziness, headaches and speech difficulty.  Hematological: Negative.  Negative for adenopathy. Does not bruise/bleed easily.  Psychiatric/Behavioral: Negative.  Negative for confusion. The patient is not nervous/anxious.      PAST MEDICAL/SURGICAL HISTORY:  Past Medical History:  Diagnosis Date  . Anemia    Hgb of 9-10  . Anemia of chronic disease    Hgb of 9-10 chronically; 06/2010: H&H-10.7/33.5, MCV-81, normal iron studies in 2010   . Arteriosclerotic cardiovascular disease (ASCVD)    Remote PTCA by patient report; LBBB; associated cardiomyopathy, presumed ischemic with EF 40-45% previously, 20% in 06/2009; h/o clinical congestive heart failure; negative stress nuclear in 2009 with inferoseptal and apical scar  . Automatic implantable cardioverter-defibrillator in situ   . Breast cancer ([place])    breast  . Chronic bronchitis ([place])   . Chronic renal disease, stage 3, moderately decreased glomerular filtration rate (GFR) between 30-59 mL/min/1.73 square meter (HCC) 02/01/2016  . Congestive heart failure (CHF) ([place])   . Elevated cholesterol   . Elevated sed rate   . GERD (gastroesophageal reflux disease)   . GI bleed   . Gout   . HOH (hard of hearing)   . Hyperlipidemia   . Hypertension   . Rheumatoid arthritis [facility])    Past Surgical History:  Procedure Laterality Date  . BI-VENTRICULAR IMPLANTABLE CARDIOVERTER DEFIBRILLATOR  (CRT-D)  07/28/2012  . BREAST BIOPSY Bilateral   . CATARACT EXTRACTION W/ INTRAOCULAR LENS IMPLANT Left   . MASTECTOMY Left 1998  .  TOTAL KNEE ARTHROPLASTY Right    Dr.Harrison  . TUBAL LIGATION       SOCIAL HISTORY:  Social History   Socioeconomic History  . Marital status: Married    Spouse name: Not on file  . Number of children: Not on file  . Years of education: Not on file  . Highest education level: Not on file  Social Needs  . Financial resource strain:  Not on file  . Food insecurity - worry: Not on file  . Food insecurity - inability: Not on file  . Transportation needs - medical: Not on file  . Transportation needs - non-medical: Not on file  Occupational History  . Occupation: Retired  Tobacco Use  . Smoking status: Former Smoker    Packs/day: 0.25    Years: 30.00    Pack years: 7.50    Types: Cigarettes    Start date: 09/17/1956    Last attempt to quit: 04/24/2007    Years since quitting: 9.8  . Smokeless tobacco: Current User    Types: Chew  . Tobacco comment: 07/03/2013 "smoked some; don't know how much or how long or when I quit"  Substance and Sexual Activity  . Alcohol use: No    Alcohol/week: 0.0 oz  . Drug use: No  . Sexual activity: Not Currently    Birth control/protection: Post-menopausal  Other Topics Concern  . Not on file  Social History Narrative   Married, lives with spouse   1 daughter, died in 33 in a car accident   Retired - worked in [employer] fundraiser   No recent travel    FAMILY HISTORY:  Family History  Problem Relation Age of Onset  . Diabetes Father   . Pancreatic cancer Father   . Hypertension Brother     CURRENT MEDICATIONS:  Outpatient Encounter Medications as of 03/05/2017  Medication Sig  . allopurinol (ZYLOPRIM) 100 MG tablet TAKE 1 TABLET (100 MG TOTAL) BY MOUTH DAILY.  . carvedilol (COREG) 6.25 MG tablet TAKE 1 TABLET BY MOUTH TWICE A DAY WITH MEALS  . furosemide (LASIX) 40 MG tablet Take 40 mg am and take 20 mg (1/2 tablet) in pm  . hydroxychloroquine (PLAQUENIL) 200 MG tablet TAKE 1 TABLET (200 MG TOTAL) BY MOUTH DAILY. MONDAY THRU FRIDAY (Patient taking differently: TAKE 1 TABLET (200 MG TOTAL) BY MOUTH DAILY.)  . lisinopril (PRINIVIL,ZESTRIL) 20 MG tablet Take 10 mg by mouth daily.   [name] lovastatin (MEVACOR) 40 MG tablet TAKE 1 TABLET (40 MG TOTAL) BY MOUTH AT BEDTIME.  . magnesium oxide (MAG-OX) 400 MG tablet Take 1 tablet (400 mg total) by mouth at bedtime.  . pantoprazole (PROTONIX) 20  MG tablet Take 1 tablet (20 mg total) by mouth daily.  . potassium chloride SA (K-DUR,KLOR-CON) 20 MEQ tablet Take 2 tablets (40 mEq total) by mouth daily.   No facility-administered encounter medications on file as of 03/05/2017.     ALLERGIES:  No Known Allergies   PHYSICAL EXAM:  ECOG Performance status: 2 - Symptomatic; requires assistance.   Physical Exam  Constitutional: She is oriented to person, place, and time and well-developed, well-nourished, and in no distress. No distress.  HENT:  Head: Normocephalic and atraumatic.  Mouth/Throat: Oropharynx is clear and moist. Mucous membranes are pale. No oropharyngeal exudate.  Very hard of hearing   Eyes: Conjunctivae are normal. Pupils are equal, round, and reactive to light. No scleral icterus.  Neck: Normal range of motion. Neck supple. No JVD present.  Cardiovascular: Normal rate,  regular rhythm and normal heart sounds. Exam reveals no gallop and no friction rub.  No murmur heard. Pulmonary/Chest: Effort normal and breath sounds normal. No respiratory distress. She has no wheezes. She has no rales.  Abdominal: Soft. Bowel sounds are normal. She exhibits no distension. There is no tenderness. There is no guarding.  Musculoskeletal: Normal range of motion. She exhibits no edema or tenderness.  Lymphadenopathy:    She has no cervical adenopathy.  Neurological: She is alert and oriented to person, place, and time. No cranial nerve deficit.  Skin: Skin is warm and dry. No rash noted. No erythema. No pallor.  Psychiatric: Mood, memory, affect and judgment normal.  BREAST: exam deferred today   LABORATORY DATA:  I have reviewed the labs as listed.  CBC    Component Value Date/Time   WBC 5.5 03/05/2017 1343   RBC 3.07 (L) 03/05/2017 1343   HGB 8.0 (L) 03/05/2017 1343   HCT 25.7 (L) 03/05/2017 1343   PLT 223 03/05/2017 1343   MCV 83.7 03/05/2017 1343   MCH 26.1 03/05/2017 1343   MCHC 31.1 03/05/2017 1343   RDW 18.3 (H)  03/05/2017 1343   LYMPHSABS 1.4 03/05/2017 1343   MONOABS 0.5 03/05/2017 1343   EOSABS 0.2 03/05/2017 1343   BASOSABS 0.0 03/05/2017 1343   CMP Latest Ref Rng & Units 03/05/2017 01/15/2017 11/29/2016  Glucose 65 - 99 mg/dL 115(H) 107(H) 104(H)  BUN 6 - 20 mg/dL 47(H) 37(H) 30(H)  Creatinine 0.44 - 1.00 mg/dL 2.27(H) 1.56(H) 1.68(H)  Sodium 135 - 145 mmol/L 137 138 138  Potassium 3.5 - 5.1 mmol/L 5.0 4.4 4.1  Chloride 101 - 111 mmol/L 102 101 105  CO2 22 - 32 mmol/L 25 26 24   Calcium 8.9 - 10.3 mg/dL 9.1 9.5 9.2  Total Protein 6.5 - 8.1 g/dL 8.1 - 7.9  Total Bilirubin 0.3 - 1.2 mg/dL 0.4 - 0.4  Alkaline Phos 38 - 126 U/L 95 - 100  AST 15 - 41 U/L 24 - 36  ALT 14 - 54 U/L 19 - 20         PENDING LABS:    DIAGNOSTIC IMAGING:  Last mammogram: 03/05/16   PATHOLOGY:     ASSESSMENT & PLAN:   Iron deficiency anemia:  -Her hemoglobin has been relatively stable in the 8 g/dL range. I believe it is multifactorial from her chronic kidney disease as well as underlying iron deficiency from blood loss. -Hemoglobin is 8 g/dL today. Her labs are pending today but back in August it did show persistent iron deficiency. -Set her up for 2 more doses of feraheme. Once her iron deficiency corrects, she will likely need to start ESA therapy for her anemia related to CKD.  History of left breast cancer:  -NED. She is now 16+ years out from her breast cancer diagnosis.  -Due for unilateral right breast mammogram in 02/2017, orders placed today.    Dispo:  -Return to clinic in 3 months for follow-up with labs as stated below.  Orders Placed This Encounter  Procedures  . MM DIAG BREAST TOMO UNI RIGHT    Standing Status:   Future    Standing Expiration Date:   03/05/2018    Order Specific Question:   Reason for Exam (SYMPTOM  OR DIAGNOSIS REQUIRED)    Answer:   annual right breast mammogram    Order Specific Question:   Preferred imaging location?    Answer:   [facility]  . CBC  with Differential  Standing Status:   Future    Standing Expiration Date:   03/05/2018  . Comprehensive metabolic panel    Standing Status:   Future    Standing Expiration Date:   03/05/2018  . Iron and TIBC    Standing Status:   Future    Standing Expiration Date:   03/05/2018  . Ferritin    Standing Status:   Future    Standing Expiration Date:   03/05/2018  . Erythropoietin    Standing Status:   Future    Standing Expiration Date:   03/05/2018     All questions were answered to patient's stated satisfaction. Encouraged patient to call with any new concerns or questions before her next visit to the cancer center and we can certain see her sooner, if needed.     Twana First, MD

## 2017-03-05 NOTE — Patient Instructions (Signed)
[place] at [facility] Discharge Instructions  RECOMMENDATIONS MADE BY THE CONSULTANT AND ANY TEST RESULTS WILL BE SENT TO YOUR REFERRING PHYSICIAN.  You were seen today by Dr. Twana First We will schedule you for mammogram in a few weeks We will do lab work on your next visit in 3 months We will schedule you for 2 doses of iron infusion Follow up as scheduled.   Thank you for choosing [place] at [facility] to provide your oncology and hematology care.  To afford each patient quality time with our provider, please arrive at least 15 minutes before your scheduled appointment time.    If you have a lab appointment with the [facility] please come in thru the  Main Entrance and check in at the main information desk  You need to re-schedule your appointment should you arrive 10 or more minutes late.  We strive to give you quality time with our providers, and arriving late affects you and other patients whose appointments are after yours.  Also, if you no show three or more times for appointments you may be dismissed from the clinic at the providers discretion.     Again, thank you for choosing [facility].  Our hope is that these requests will decrease the amount of time that you wait before being seen by our physicians.       _____________________________________________________________  Should you have questions after your visit to [facility], please contact our office at [phone] between the hours of 8:30 a.m. and 4:30 p.m.  Voicemails left after 4:30 p.m. will not be returned until the following business day.  For prescription refill requests, have your pharmacy contact our office.       Resources For Cancer Patients and their Caregivers ? American Cancer Society: Can assist with transportation, wigs, general needs, runs Look Good Feel Better.        (404) 605-2259 ? Cancer Care: Provides financial  assistance, online support groups, medication/co-pay assistance.  1-800-813-HOPE 803-670-9296) ? [place] Assists [place] cancer patients and their families through emotional , educational and financial support.  303 071 8313 ? [place] DSS Where to apply for food stamps, Medicaid and utility assistance. 779-681-9890 ? [facility]: Transportation to medical appointments. 845-261-2574 ? Social Security Administration: May apply for disability if have a Stage IV cancer. 865-827-5505 (445) 655-1298 ? [employer], Disability and Transit Services: Assists with nutrition, care and transit needs. [place] Support Programs: @10RELATIVEDAYS @ > Cancer Support Group  2nd Tuesday of the month 1pm-2pm, Journey Room  > Creative Journey  3rd Tuesday of the month 1130am-1pm, Journey Room  > Look Good Feel Better  1st Wednesday of the month 10am-12 noon, Journey Room (Call [place] to register [phone])

## 2017-03-06 ENCOUNTER — Other Ambulatory Visit (HOSPITAL_COMMUNITY): Payer: Self-pay | Admitting: Oncology

## 2017-03-06 DIAGNOSIS — Z1231 Encounter for screening mammogram for malignant neoplasm of breast: Secondary | ICD-10-CM

## 2017-03-12 ENCOUNTER — Encounter (HOSPITAL_BASED_OUTPATIENT_CLINIC_OR_DEPARTMENT_OTHER): Payer: Medicare HMO

## 2017-03-12 ENCOUNTER — Encounter (HOSPITAL_COMMUNITY): Payer: Self-pay

## 2017-03-12 ENCOUNTER — Other Ambulatory Visit: Payer: Self-pay

## 2017-03-12 VITALS — BP 108/38 | HR 69 | Temp 98.6°F | Resp 16

## 2017-03-12 DIAGNOSIS — D509 Iron deficiency anemia, unspecified: Secondary | ICD-10-CM | POA: Diagnosis not present

## 2017-03-12 DIAGNOSIS — N183 Chronic kidney disease, stage 3 (moderate): Secondary | ICD-10-CM

## 2017-03-12 DIAGNOSIS — N1832 Chronic kidney disease, stage 3b: Secondary | ICD-10-CM

## 2017-03-12 DIAGNOSIS — D649 Anemia, unspecified: Secondary | ICD-10-CM

## 2017-03-12 DIAGNOSIS — D508 Other iron deficiency anemias: Secondary | ICD-10-CM

## 2017-03-12 DIAGNOSIS — D638 Anemia in other chronic diseases classified elsewhere: Secondary | ICD-10-CM

## 2017-03-12 DIAGNOSIS — D62 Acute posthemorrhagic anemia: Secondary | ICD-10-CM

## 2017-03-12 MED ORDER — FERUMOXYTOL INJECTION 510 MG/17 ML
510.0000 mg | Freq: Once | INTRAVENOUS | Status: AC
  Administered 2017-03-12: 510 mg via INTRAVENOUS
  Filled 2017-03-12: qty 17

## 2017-03-12 MED ORDER — SODIUM CHLORIDE 0.9 % IV SOLN
Freq: Once | INTRAVENOUS | Status: AC
  Administered 2017-03-12: 14:00:00 via INTRAVENOUS

## 2017-03-12 NOTE — Progress Notes (Signed)
Treatment given per orders. Patient tolerated it well without problems. Vitals stable and discharged home from clinic ambulatory. Follow up as scheduled.  

## 2017-03-12 NOTE — Patient Instructions (Signed)
[facility] at [facility] Discharge Instructions  RECOMMENDATIONS MADE BY THE CONSULTANT AND ANY TEST RESULTS WILL BE SENT TO YOUR REFERRING PHYSICIAN.  Feraheme given  Follow up as scheduled.  Thank you for choosing [facility] at [facility] to provide your oncology and hematology care.  To afford each patient quality time with our provider, please arrive at least 15 minutes before your scheduled appointment time.    If you have a lab appointment with the Cancer Center please come in thru the  Main Entrance and check in at the main information desk  You need to re-schedule your appointment should you arrive 10 or more minutes late.  We strive to give you quality time with our providers, and arriving late affects you and other patients whose appointments are after yours.  Also, if you no show three or more times for appointments you may be dismissed from the clinic at the providers discretion.     Again, thank you for choosing [facility].  Our hope is that these requests will decrease the amount of time that you wait before being seen by our physicians.       _____________________________________________________________  Should you have questions after your visit to [facility], please contact our office at [phone] between the hours of 8:30 a.m. and 4:30 p.m.  Voicemails left after 4:30 p.m. will not be returned until the following business day.  For prescription refill requests, have your pharmacy contact our office.       Resources For Cancer Patients and their Caregivers ? American Cancer Society: Can assist with transportation, wigs, general needs, runs Look Good Feel Better.        1-888-227-6333 ? Cancer Care: Provides financial assistance, online support groups, medication/co-pay assistance.  1-800-813-HOPE (4673) ? [facility] Assists [place] cancer patients and their  families through emotional , educational and financial support.  336-427-4357 ? [place] DSS Where to apply for food stamps, Medicaid and utility assistance. 336-342-1394 ? [facility]: Transportation to medical appointments. 336-347-2287 ? Social Security Administration: May apply for disability if have a Stage IV cancer. 336-342-7796 1-800-772-1213 ? [place] Aging, Disability and Transit Services: Assists with nutrition, care and transit needs. 336-349-2343  Cancer Center Support Programs: @[email or web address]@ > Cancer Support Group  2nd Tuesday of the month 1pm-2pm, Journey Room  > Creative Journey  3rd Tuesday of the month 1130am-1pm, Journey Room  > Look Good Feel Better  1st Wednesday of the month 10am-12 noon, Journey Room (Call American Cancer Society to register 1-800-395-5775)   

## 2017-03-13 DIAGNOSIS — D649 Anemia, unspecified: Secondary | ICD-10-CM | POA: Diagnosis not present

## 2017-03-13 DIAGNOSIS — I5022 Chronic systolic (congestive) heart failure: Secondary | ICD-10-CM | POA: Diagnosis not present

## 2017-03-13 DIAGNOSIS — I10 Essential (primary) hypertension: Secondary | ICD-10-CM | POA: Diagnosis not present

## 2017-03-13 DIAGNOSIS — M069 Rheumatoid arthritis, unspecified: Secondary | ICD-10-CM | POA: Diagnosis not present

## 2017-03-13 DIAGNOSIS — Z23 Encounter for immunization: Secondary | ICD-10-CM | POA: Diagnosis not present

## 2017-03-19 ENCOUNTER — Encounter (HOSPITAL_COMMUNITY): Payer: Self-pay

## 2017-03-19 ENCOUNTER — Encounter (HOSPITAL_COMMUNITY): Payer: Medicare HMO

## 2017-03-19 ENCOUNTER — Other Ambulatory Visit: Payer: Self-pay

## 2017-03-19 ENCOUNTER — Encounter (HOSPITAL_BASED_OUTPATIENT_CLINIC_OR_DEPARTMENT_OTHER): Payer: Medicare HMO

## 2017-03-19 VITALS — BP 108/43 | HR 68 | Temp 98.0°F | Resp 16 | Wt 140.8 lb

## 2017-03-19 DIAGNOSIS — D649 Anemia, unspecified: Secondary | ICD-10-CM

## 2017-03-19 DIAGNOSIS — N183 Chronic kidney disease, stage 3 (moderate): Secondary | ICD-10-CM

## 2017-03-19 DIAGNOSIS — N1832 Chronic kidney disease, stage 3b: Secondary | ICD-10-CM

## 2017-03-19 DIAGNOSIS — D62 Acute posthemorrhagic anemia: Secondary | ICD-10-CM

## 2017-03-19 DIAGNOSIS — D508 Other iron deficiency anemias: Secondary | ICD-10-CM

## 2017-03-19 DIAGNOSIS — D638 Anemia in other chronic diseases classified elsewhere: Secondary | ICD-10-CM

## 2017-03-19 DIAGNOSIS — D509 Iron deficiency anemia, unspecified: Secondary | ICD-10-CM | POA: Diagnosis not present

## 2017-03-19 MED ORDER — SODIUM CHLORIDE 0.9 % IV SOLN
510.0000 mg | Freq: Once | INTRAVENOUS | Status: AC
  Administered 2017-03-19: 510 mg via INTRAVENOUS
  Filled 2017-03-19: qty 17

## 2017-03-19 MED ORDER — SODIUM CHLORIDE 0.9 % IV SOLN
Freq: Once | INTRAVENOUS | Status: AC
  Administered 2017-03-19: 12:00:00 via INTRAVENOUS

## 2017-03-19 NOTE — Patient Instructions (Signed)
[facility] at [facility] Discharge Instructions  RECOMMENDATIONS MADE BY THE CONSULTANT AND ANY TEST RESULTS WILL BE SENT TO YOUR REFERRING PHYSICIAN.  Feraheme given today Follow up as scheduled.  Thank you for choosing  [facility] at [facility] to provide your oncology and hematology care.  To afford each patient quality time with our provider, please arrive at least 15 minutes before your scheduled appointment time.    If you have a lab appointment with the Cancer Center please come in thru the  Main Entrance and check in at the main information desk  You need to re-schedule your appointment should you arrive 10 or more minutes late.  We strive to give you quality time with our providers, and arriving late affects you and other patients whose appointments are after yours.  Also, if you no show three or more times for appointments you may be dismissed from the clinic at the providers discretion.     Again, thank you for choosing [facility].  Our hope is that these requests will decrease the amount of time that you wait before being seen by our physicians.       _____________________________________________________________  Should you have questions after your visit to [facility], please contact our office at [phone] between the hours of 8:30 a.m. and 4:30 p.m.  Voicemails left after 4:30 p.m. will not be returned until the following business day.  For prescription refill requests, have your pharmacy contact our office.       Resources For Cancer Patients and their Caregivers ? American Cancer Society: Can assist with transportation, wigs, general needs, runs Look Good Feel Better.        1-888-227-6333 ? Cancer Care: Provides financial assistance, online support groups, medication/co-pay assistance.  1-800-813-HOPE (4673) ? [facility] Assists [place] cancer patients and  their families through emotional , educational and financial support.  336-427-4357 ? [place] DSS Where to apply for food stamps, Medicaid and utility assistance. 336-342-1394 ? RCATS: Transportation to medical appointments. 336-347-2287 ? Social Security Administration: May apply for disability if have a Stage IV cancer. 336-342-7796 1-800-772-1213 ? [place] Aging, Disability and Transit Services: Assists with nutrition, care and transit needs. 336-349-2343  Cancer Center Support Programs: @[email or web address]@ > Cancer Support Group  2nd Tuesday of the month 1pm-2pm, Journey Room  > Creative Journey  3rd Tuesday of the month 1130am-1pm, Journey Room  > Look Good Feel Better  1st Wednesday of the month 10am-12 noon, Journey Room (Call American Cancer Society to register 1-800-395-5775)   

## 2017-03-19 NOTE — Progress Notes (Signed)
Treatment given per orders. Patient tolerated it well without problems. Vitals stable and discharged home from clinic ambulatory. Follow up as scheduled.  

## 2017-03-20 ENCOUNTER — Ambulatory Visit (HOSPITAL_COMMUNITY)
Admission: RE | Admit: 2017-03-20 | Discharge: 2017-03-20 | Disposition: A | Discharge status: Home / Self Care | Payer: Medicare HMO | Source: Ambulatory Visit | Attending: Oncology | Admitting: Oncology

## 2017-03-20 ENCOUNTER — Encounter (HOSPITAL_COMMUNITY): Payer: Medicare HMO

## 2017-03-20 DIAGNOSIS — Z1231 Encounter for screening mammogram for malignant neoplasm of breast: Secondary | ICD-10-CM | POA: Insufficient documentation

## 2017-03-20 DIAGNOSIS — R928 Other abnormal and inconclusive findings on diagnostic imaging of breast: Secondary | ICD-10-CM | POA: Diagnosis not present

## 2017-03-20 DIAGNOSIS — N6310 Unspecified lump in the right breast, unspecified quadrant: Secondary | ICD-10-CM | POA: Insufficient documentation

## 2017-03-21 ENCOUNTER — Other Ambulatory Visit: Payer: Self-pay | Admitting: Oncology

## 2017-03-21 ENCOUNTER — Ambulatory Visit (INDEPENDENT_AMBULATORY_CARE_PROVIDER_SITE_OTHER): Payer: Medicare HMO

## 2017-03-21 DIAGNOSIS — Z95810 Presence of automatic (implantable) cardiac defibrillator: Secondary | ICD-10-CM | POA: Diagnosis not present

## 2017-03-21 DIAGNOSIS — I5042 Chronic combined systolic (congestive) and diastolic (congestive) heart failure: Secondary | ICD-10-CM

## 2017-03-21 DIAGNOSIS — R928 Other abnormal and inconclusive findings on diagnostic imaging of breast: Secondary | ICD-10-CM

## 2017-03-22 ENCOUNTER — Telehealth: Payer: Self-pay

## 2017-03-22 NOTE — Telephone Encounter (Signed)
Remote ICM transmission received.  Attempted call to nephew, [name] and left detailed message per [facility] regarding transmission and next ICM scheduled for 1/32019.  Advised to return call for any fluid symptoms or questions.

## 2017-03-22 NOTE — Progress Notes (Signed)
EPIC Encounter for ICM Monitoring  Patient Name: [name] is a 76 y.o. female Date: 03/22/2017 Primary Care Physican: [name], MD Primary Cardiologist:Koneswaran/Lawrence NP Electrophysiologist: Lovena Le Dry Weight:unknown Bi-V Pacing: 91.6%      Attempted call to nephew [name].  Transmission reviewed and left detailed message regarding transmission.    Thoracic impedance normal.  Prescribed dosage: Furosemide 40 mg 1 tablet (40 mg total) every AM and 0.5 tablet (20 mg total) every PM.Klor Con 20 mEq 2tablets (40 mEq)daily.  Labs:  03/05/2017 Creatinine 2.27, BUN 47, Potassium 5.0, Sodium 137, EGFR 20-23 01/15/2017 Creatinine 1.56, BUN 37, Potassium 4.4, Sodium 139, EGFR 31-36 11/29/2016 Creatinine 1.68, BUN 30, Potassium 4.1, Sodium 138, EGFR 28-33 10/23/2016 Creatinine 2.08 BUN 31, Potassium 5.3, Sodium 136, EGFR 22-25 10/15/2016 Creatinine 0.99, BUN 18, Potassium 3.0, Sodium 136, EGFR 53->60 10/08/2016 Creatinine 0.95, BUN 16, Potassium 3.1, Sodium 140, EGFR 56->60 09/23/2016 Creatinine 0.74, BUN 13, Potassium 4.0, Sodium 141, EGFR >60 09/22/2016 Creatinine 0.90, BUN 15, Potassium 3.9, Sodium 141, EGFR >60  09/21/2016 Creatinine 1.05, BUN 26, Potassium 3.7, Sodium 140, EGFR 50-57  09/20/2016 Creatinine 1.53, BUN 37, Potassium 3.9, Sodium 140, EGFR 31-36 09/05/2016 Creatinine 1.25, BUN 29, Potassium 4.0, Sodium 137, EGFR 40-47  08/08/2016 Creatinine 1.10, BUN 24, Potassium 3.6, Sodium 136, EGFR 47-55 07/19/2016 Creatinine 1.26, BUN 29, Potassium 3.6, Sodium 139, EGFR 40-46 06/22/2016 Creatinine 1.44, BUN 37, Potassium 3.7, Sodium 140, EGFR 34-39 06/01/2016 Creatinine 1.68, BUN 43, Potassium 4.7, Sodium 132, EGFR 28-33  Recommendations: Left voice mail with ICM number and encouraged to call if experiencing any fluid symptoms.  Follow-up plan: ICM clinic phone appointment on 04/25/2017.    Copy of ICM check sent to Dr. Lovena Le.   3 month ICM  trend: 03/21/2017    1 Year ICM trend:       [name], RN 03/22/2017 11:18 AM

## 2017-04-02 ENCOUNTER — Ambulatory Visit (HOSPITAL_COMMUNITY)
Admission: RE | Admit: 2017-04-02 | Discharge: 2017-04-02 | Disposition: A | Discharge status: Home / Self Care | Payer: Medicare HMO | Source: Ambulatory Visit | Attending: Oncology | Admitting: Oncology

## 2017-04-02 DIAGNOSIS — R928 Other abnormal and inconclusive findings on diagnostic imaging of breast: Secondary | ICD-10-CM

## 2017-04-02 DIAGNOSIS — N6489 Other specified disorders of breast: Secondary | ICD-10-CM | POA: Diagnosis not present

## 2017-04-25 ENCOUNTER — Ambulatory Visit (INDEPENDENT_AMBULATORY_CARE_PROVIDER_SITE_OTHER): Payer: Medicare HMO

## 2017-04-25 DIAGNOSIS — I5042 Chronic combined systolic (congestive) and diastolic (congestive) heart failure: Secondary | ICD-10-CM

## 2017-04-25 DIAGNOSIS — Z95810 Presence of automatic (implantable) cardiac defibrillator: Secondary | ICD-10-CM

## 2017-04-25 NOTE — Progress Notes (Signed)
EPIC Encounter for ICM Monitoring  Patient Name: [name] is a 77 y.o. female Date: 04/25/2017 Primary Care Physican: [name], MD Primary Cardiologist:Koneswaran/Lawrence NP Electrophysiologist: Lovena Le Dry Weight:unknown Bi-V Pacing: 90%                                                  Attempted call to nephew [name].  Transmission reviewed and left detailed message regarding transmission.    Thoracic impedance normal.  Prescribed dosage: Furosemide 40 mg 1 tablet (40 mg total) every AM and 0.5 tablet (20 mg total) every PM.Klor Con 20 mEq 2tablets (40 mEq)daily.  Labs:  03/05/2017 Creatinine 2.27, BUN 47, Potassium 5.0, Sodium 137, EGFR 20-23 01/15/2017 Creatinine 1.56, BUN 37, Potassium 4.4, Sodium 139, EGFR 31-36 11/29/2016 Creatinine 1.68, BUN 30, Potassium 4.1, Sodium 138, EGFR 28-33 10/23/2016 Creatinine 2.08 BUN 31, Potassium 5.3, Sodium 136, EGFR 22-25 10/15/2016 Creatinine 0.99, BUN 18, Potassium 3.0, Sodium 136, EGFR 53->60 10/08/2016 Creatinine 0.95, BUN 16, Potassium 3.1, Sodium 140, EGFR 56->60 09/23/2016 Creatinine 0.74, BUN 13, Potassium 4.0, Sodium 141, EGFR >60 09/22/2016 Creatinine 0.90, BUN 15, Potassium 3.9, Sodium 141, EGFR >60  09/21/2016 Creatinine 1.05, BUN 26, Potassium 3.7, Sodium 140, EGFR 50-57  09/20/2016 Creatinine 1.53, BUN 37, Potassium 3.9, Sodium 140, EGFR 31-36 09/05/2016 Creatinine 1.25, BUN 29, Potassium 4.0, Sodium 137, EGFR 40-47  08/08/2016 Creatinine 1.10, BUN 24, Potassium 3.6, Sodium 136, EGFR 47-55 07/19/2016 Creatinine 1.26, BUN 29, Potassium 3.6, Sodium 139, EGFR 40-46 06/22/2016 Creatinine 1.44, BUN 37, Potassium 3.7, Sodium 140, EGFR 34-39 06/01/2016 Creatinine 1.68, BUN 43, Potassium 4.7, Sodium 132, EGFR 28-33  Recommendations: Left voice mail with ICM number and encouraged to call if experiencing any fluid symptoms.  Follow-up plan: ICM clinic phone appointment on 05/27/2017.    Copy of ICM  check sent to Dr. Lovena Le.   3 month ICM trend: 04/25/2017    1 Year ICM trend:       [name], RN 04/25/2017 3:48 PM

## 2017-04-26 ENCOUNTER — Telehealth: Payer: Self-pay

## 2017-04-26 NOTE — Telephone Encounter (Signed)
Remote ICM transmission received.  Attempted call to nephew and left detailed message per DPR regarding transmission and next ICM scheduled for 05/27/2017.  Advised to return call for any fluid symptoms or questions.

## 2017-05-15 ENCOUNTER — Telehealth: Payer: Self-pay

## 2017-05-15 NOTE — Telephone Encounter (Signed)
Called pt to see if she still will need patient assistance for Colcrys through [place]. Spoke with her nephew, [name] who states that she will need assistance this year. She is just about out of medication. He plans to bring her to the clinic Friday to sign application come provide proof of income. She may need samples by then. He also states that her copay for hydrochloroquine has gone up to a teir 3. She is currently using [facility]. She has a 90day supply ready for delivery at $150. She cannot afford this.   Called pts insurance to check who her preferred pharmacy was and why she had an expensive copay. Spoke with [place] who states that [facility] [phone] [phone]) She is able to get a 30 days supply at a time (current co-pay $16.02. May fluctuate due to deductible, drug spend and true out of pocket expenses). She was being charged for a 90 day at [facility].She can call member services at [phone] to discuss her copay and deductible amounts.   Called Robert back to update. Left message.  Aksh Swart, [place], CPhT 3:00 PM

## 2017-05-16 ENCOUNTER — Other Ambulatory Visit: Payer: Self-pay | Admitting: Adult Health

## 2017-05-24 ENCOUNTER — Telehealth: Payer: Self-pay | Admitting: Rheumatology

## 2017-05-24 NOTE — Telephone Encounter (Signed)
Patients son returned your call. He is working, but will try to call you back this pm.

## 2017-05-24 NOTE — Telephone Encounter (Signed)
Called son, [name] to follow up with the proof of income and copay for hydroxychloroquine. Left voice message.   Marykay Mccleod, [place], CPhT 12:37 PM

## 2017-05-27 ENCOUNTER — Ambulatory Visit (INDEPENDENT_AMBULATORY_CARE_PROVIDER_SITE_OTHER): Payer: Medicare HMO | Admitting: *Deleted

## 2017-05-27 ENCOUNTER — Other Ambulatory Visit: Payer: Self-pay

## 2017-05-27 ENCOUNTER — Other Ambulatory Visit: Payer: Self-pay | Admitting: Rheumatology

## 2017-05-27 DIAGNOSIS — M0579 Rheumatoid arthritis with rheumatoid factor of multiple sites without organ or systems involvement: Secondary | ICD-10-CM

## 2017-05-27 DIAGNOSIS — Z95810 Presence of automatic (implantable) cardiac defibrillator: Secondary | ICD-10-CM | POA: Diagnosis not present

## 2017-05-27 DIAGNOSIS — I5042 Chronic combined systolic (congestive) and diastolic (congestive) heart failure: Secondary | ICD-10-CM

## 2017-05-27 DIAGNOSIS — I255 Ischemic cardiomyopathy: Secondary | ICD-10-CM

## 2017-05-27 MED ORDER — HYDROXYCHLOROQUINE SULFATE 200 MG PO TABS: ORAL_TABLET | ORAL | 0 refills | Status: DC

## 2017-05-27 NOTE — Progress Notes (Signed)
EPIC Encounter for ICM Monitoring  Patient Name: [name] is a 77 y.o. female Date: 05/27/2017 Primary Care Physican: [name], MD Primary Cardiologist:Koneswaran/Lawrence NP Electrophysiologist: Lovena Le Dry Weight:unknown Bi-V Pacing: 93.1%      Call to nephew [name].  Heart Failure questions reviewed, pt asymptomatic.   Thoracic impedance normal.  Prescribed dosage: Furosemide 40 mg 1 tablet (40 mg total) every AM and 0.5 tablet (20 mg total) every PM.Klor Con 20 mEq 2tablets (40 mEq)daily.  Labs:  03/05/2017 Creatinine 2.27, BUN 47, Potassium 5.0, Sodium 137, EGFR 20-23 01/15/2017 Creatinine 1.56, BUN 37, Potassium 4.4, Sodium 139, EGFR 31-36 11/29/2016 Creatinine 1.68, BUN 30, Potassium 4.1, Sodium 138, EGFR 28-33 10/23/2016 Creatinine 2.08 BUN 31, Potassium 5.3, Sodium 136, EGFR 22-25 10/15/2016 Creatinine 0.99, BUN 18, Potassium 3.0, Sodium 136, EGFR 53->60 10/08/2016 Creatinine 0.95, BUN 16, Potassium 3.1, Sodium 140, EGFR 56->60 09/23/2016 Creatinine 0.74, BUN 13, Potassium 4.0, Sodium 141, EGFR >60 09/22/2016 Creatinine 0.90, BUN 15, Potassium 3.9, Sodium 141, EGFR >60  09/21/2016 Creatinine 1.05, BUN 26, Potassium 3.7, Sodium 140, EGFR 50-57  09/20/2016 Creatinine 1.53, BUN 37, Potassium 3.9, Sodium 140, EGFR 31-36 09/05/2016 Creatinine 1.25, BUN 29, Potassium 4.0, Sodium 137, EGFR 40-47  08/08/2016 Creatinine 1.10, BUN 24, Potassium 3.6, Sodium 136, EGFR 47-55 07/19/2016 Creatinine 1.26, BUN 29, Potassium 3.6, Sodium 139, EGFR 40-46 06/22/2016 Creatinine 1.44, BUN 37, Potassium 3.7, Sodium 140, EGFR 34-39 06/01/2016 Creatinine 1.68, BUN 43, Potassium 4.7, Sodium 132, EGFR 28-33  Recommendations: No changes.    Encouraged to call for fluid symptoms.  Follow-up plan: ICM clinic phone appointment on 06/27/2017.   Copy of ICM check sent to Dr. Lovena Le.   3 month ICM trend: 05/27/2017    1 Year ICM trend:       [name],  RN 05/27/2017 2:56 PM

## 2017-05-27 NOTE — Progress Notes (Signed)
Remote ICD transmission.   

## 2017-05-27 NOTE — Telephone Encounter (Signed)
Patient's son returned call. He plans to bring [name] to the clinic on tomorrow to with proof of income and to sign application. He is still working on getting information about gen plaquenil and [facility].   Cordel Drewes, [place], CPhT 9:33 AM

## 2017-05-27 NOTE — Progress Notes (Signed)
Last visit: 01/11/2017 Next visit: 07/12/2017 Labs: 03/05/2017 stable  Eye exam: 01/08/2017  Okay to refill per Lovena Le.

## 2017-05-27 NOTE — Telephone Encounter (Signed)
Patient's son said that he checked on her Plaquenil from [place] for 90 day supply but it is considered a Tier 3 drug and it is too expensive.  [facility] [place] [facility] to our office to get a 30 day supply prescription to see if that is less expensive.

## 2017-05-28 ENCOUNTER — Telehealth: Payer: Self-pay

## 2017-05-28 ENCOUNTER — Encounter: Payer: Self-pay | Admitting: Cardiology

## 2017-05-28 NOTE — Telephone Encounter (Signed)
Patient and son, [name] came by clinic today to sign application for Colc[employer] patient assistance and provide income documents. Provider portion complete. Application has bee faxed to foundation. Will update once we receive a response.   [facility] of [facility] for pt with insurance. Rx is ready for pt to pick up for a 30 days supply at $16.02. Patient voices understanding and denies any questions at this time.  [name], [name], CPhT 11:42 AM

## 2017-05-29 ENCOUNTER — Inpatient Hospital Stay (HOSPITAL_COMMUNITY): Payer: Medicare HMO | Attending: Oncology

## 2017-05-29 DIAGNOSIS — Z853 Personal history of malignant neoplasm of breast: Secondary | ICD-10-CM | POA: Insufficient documentation

## 2017-05-29 DIAGNOSIS — Z955 Presence of coronary angioplasty implant and graft: Secondary | ICD-10-CM | POA: Insufficient documentation

## 2017-05-29 DIAGNOSIS — E785 Hyperlipidemia, unspecified: Secondary | ICD-10-CM | POA: Insufficient documentation

## 2017-05-29 DIAGNOSIS — D638 Anemia in other chronic diseases classified elsewhere: Secondary | ICD-10-CM | POA: Insufficient documentation

## 2017-05-29 DIAGNOSIS — I130 Hypertensive heart and chronic kidney disease with heart failure and stage 1 through stage 4 chronic kidney disease, or unspecified chronic kidney disease: Secondary | ICD-10-CM | POA: Diagnosis not present

## 2017-05-29 DIAGNOSIS — I429 Cardiomyopathy, unspecified: Secondary | ICD-10-CM | POA: Insufficient documentation

## 2017-05-29 DIAGNOSIS — H9190 Unspecified hearing loss, unspecified ear: Secondary | ICD-10-CM | POA: Insufficient documentation

## 2017-05-29 DIAGNOSIS — Z87891 Personal history of nicotine dependence: Secondary | ICD-10-CM | POA: Diagnosis not present

## 2017-05-29 DIAGNOSIS — Q2730 Arteriovenous malformation, site unspecified: Secondary | ICD-10-CM | POA: Insufficient documentation

## 2017-05-29 DIAGNOSIS — R5383 Other fatigue: Secondary | ICD-10-CM | POA: Diagnosis not present

## 2017-05-29 DIAGNOSIS — I509 Heart failure, unspecified: Secondary | ICD-10-CM | POA: Diagnosis not present

## 2017-05-29 DIAGNOSIS — R232 Flushing: Secondary | ICD-10-CM | POA: Diagnosis not present

## 2017-05-29 DIAGNOSIS — R531 Weakness: Secondary | ICD-10-CM | POA: Diagnosis not present

## 2017-05-29 DIAGNOSIS — Z79899 Other long term (current) drug therapy: Secondary | ICD-10-CM | POA: Diagnosis not present

## 2017-05-29 DIAGNOSIS — I2510 Atherosclerotic heart disease of native coronary artery without angina pectoris: Secondary | ICD-10-CM | POA: Insufficient documentation

## 2017-05-29 DIAGNOSIS — K219 Gastro-esophageal reflux disease without esophagitis: Secondary | ICD-10-CM | POA: Insufficient documentation

## 2017-05-29 DIAGNOSIS — C50011 Malignant neoplasm of nipple and areola, right female breast: Secondary | ICD-10-CM

## 2017-05-29 DIAGNOSIS — D508 Other iron deficiency anemias: Secondary | ICD-10-CM

## 2017-05-29 DIAGNOSIS — D509 Iron deficiency anemia, unspecified: Secondary | ICD-10-CM | POA: Diagnosis present

## 2017-05-29 LAB — CBC WITH DIFFERENTIAL/PLATELET
BASOS PCT: 1 %
Basophils Absolute: 0 10*3/uL (ref 0.0–0.1)
Eosinophils Absolute: 0.2 10*3/uL (ref 0.0–0.7)
Eosinophils Relative: 4 %
HEMATOCRIT: 28.4 % — AB (ref 36.0–46.0)
Hemoglobin: 8.7 g/dL — ABNORMAL LOW (ref 12.0–15.0)
LYMPHS PCT: 29 %
Lymphs Abs: 1.5 10*3/uL (ref 0.7–4.0)
MCH: 27.7 pg (ref 26.0–34.0)
MCHC: 30.6 g/dL (ref 30.0–36.0)
MCV: 90.4 fL (ref 78.0–100.0)
MONOS PCT: 16 %
Monocytes Absolute: 0.8 10*3/uL (ref 0.1–1.0)
NEUTROS ABS: 2.6 10*3/uL (ref 1.7–7.7)
Neutrophils Relative %: 50 %
Platelets: 187 10*3/uL (ref 150–400)
RBC: 3.14 MIL/uL — ABNORMAL LOW (ref 3.87–5.11)
RDW: 18.3 % — ABNORMAL HIGH (ref 11.5–15.5)
WBC: 5.1 10*3/uL (ref 4.0–10.5)

## 2017-05-29 LAB — IRON AND TIBC
Iron: 37 ug/dL (ref 28–170)
Saturation Ratios: 19 % (ref 10.4–31.8)
TIBC: 199 ug/dL — ABNORMAL LOW (ref 250–450)
UIBC: 162 ug/dL

## 2017-05-29 LAB — COMPREHENSIVE METABOLIC PANEL
ALBUMIN: 3.3 g/dL — AB (ref 3.5–5.0)
ALK PHOS: 88 U/L (ref 38–126)
ALT: 20 U/L (ref 14–54)
ANION GAP: 11 (ref 5–15)
AST: 25 U/L (ref 15–41)
BILIRUBIN TOTAL: 0.4 mg/dL (ref 0.3–1.2)
BUN: 45 mg/dL — AB (ref 6–20)
CALCIUM: 9.5 mg/dL (ref 8.9–10.3)
CO2: 24 mmol/L (ref 22–32)
Chloride: 103 mmol/L (ref 101–111)
Creatinine, Ser: 1.81 mg/dL — ABNORMAL HIGH (ref 0.44–1.00)
GFR calc Af Amer: 30 mL/min — ABNORMAL LOW (ref >60)
GFR calc non Af Amer: 26 mL/min — ABNORMAL LOW (ref >60)
GLUCOSE: 93 mg/dL (ref 65–99)
Potassium: 4.5 mmol/L (ref 3.5–5.1)
Sodium: 138 mmol/L (ref 135–145)
TOTAL PROTEIN: 8.2 g/dL — AB (ref 6.5–8.1)

## 2017-05-29 LAB — FERRITIN: Ferritin: 778 ng/mL — ABNORMAL HIGH (ref 11–307)

## 2017-05-30 LAB — ERYTHROPOIETIN: ERYTHROPOIETIN: 19.5 m[IU]/mL — AB (ref 2.6–18.5)

## 2017-06-05 ENCOUNTER — Encounter (HOSPITAL_COMMUNITY): Payer: Self-pay | Admitting: Adult Health

## 2017-06-05 ENCOUNTER — Inpatient Hospital Stay (HOSPITAL_BASED_OUTPATIENT_CLINIC_OR_DEPARTMENT_OTHER): Payer: Medicare HMO | Admitting: Adult Health

## 2017-06-05 VITALS — BP 140/59 | HR 73 | Temp 98.6°F | Resp 16 | Wt 144.0 lb

## 2017-06-05 DIAGNOSIS — I509 Heart failure, unspecified: Secondary | ICD-10-CM

## 2017-06-05 DIAGNOSIS — D638 Anemia in other chronic diseases classified elsewhere: Secondary | ICD-10-CM | POA: Diagnosis not present

## 2017-06-05 DIAGNOSIS — R531 Weakness: Secondary | ICD-10-CM | POA: Diagnosis not present

## 2017-06-05 DIAGNOSIS — E785 Hyperlipidemia, unspecified: Secondary | ICD-10-CM

## 2017-06-05 DIAGNOSIS — Z955 Presence of coronary angioplasty implant and graft: Secondary | ICD-10-CM

## 2017-06-05 DIAGNOSIS — Z79899 Other long term (current) drug therapy: Secondary | ICD-10-CM

## 2017-06-05 DIAGNOSIS — R5383 Other fatigue: Secondary | ICD-10-CM | POA: Diagnosis not present

## 2017-06-05 DIAGNOSIS — D509 Iron deficiency anemia, unspecified: Secondary | ICD-10-CM

## 2017-06-05 DIAGNOSIS — I429 Cardiomyopathy, unspecified: Secondary | ICD-10-CM

## 2017-06-05 DIAGNOSIS — I130 Hypertensive heart and chronic kidney disease with heart failure and stage 1 through stage 4 chronic kidney disease, or unspecified chronic kidney disease: Secondary | ICD-10-CM

## 2017-06-05 DIAGNOSIS — R232 Flushing: Secondary | ICD-10-CM

## 2017-06-05 DIAGNOSIS — Q2730 Arteriovenous malformation, site unspecified: Secondary | ICD-10-CM | POA: Diagnosis not present

## 2017-06-05 DIAGNOSIS — Z853 Personal history of malignant neoplasm of breast: Secondary | ICD-10-CM

## 2017-06-05 DIAGNOSIS — H9190 Unspecified hearing loss, unspecified ear: Secondary | ICD-10-CM

## 2017-06-05 DIAGNOSIS — D508 Other iron deficiency anemias: Secondary | ICD-10-CM

## 2017-06-05 DIAGNOSIS — I2510 Atherosclerotic heart disease of native coronary artery without angina pectoris: Secondary | ICD-10-CM | POA: Diagnosis not present

## 2017-06-05 DIAGNOSIS — K219 Gastro-esophageal reflux disease without esophagitis: Secondary | ICD-10-CM

## 2017-06-05 DIAGNOSIS — Z87891 Personal history of nicotine dependence: Secondary | ICD-10-CM

## 2017-06-05 DIAGNOSIS — D649 Anemia, unspecified: Secondary | ICD-10-CM

## 2017-06-05 NOTE — Patient Instructions (Signed)
[facility] at [facility] Discharge Instructions  RECOMMENDATIONS MADE BY THE CONSULTANT AND ANY TEST RESULTS WILL BE SENT TO YOUR REFERRING PHYSICIAN.  You saw Gretchen Dawson, NP, today See Amy at checkout for appointments.   Thank you for choosing [facility] at [facility] to provide your oncology and hematology care.  To afford each patient quality time with our provider, please arrive at least 15 minutes before your scheduled appointment time.    If you have a lab appointment with the Cancer Center please come in thru the  Main Entrance and check in at the main information desk  You need to re-schedule your appointment should you arrive 10 or more minutes late.  We strive to give you quality time with our providers, and arriving late affects you and other patients whose appointments are after yours.  Also, if you no show three or more times for appointments you may be dismissed from the clinic at the providers discretion.     Again, thank you for choosing [facility].  Our hope is that these requests will decrease the amount of time that you wait before being seen by our physicians.       _____________________________________________________________  Should you have questions after your visit to [facility], please contact our office at [phone] between the hours of 8:30 a.m. and 4:30 p.m.  Voicemails left after 4:30 p.m. will not be returned until the following business day.  For prescription refill requests, have your pharmacy contact our office.       Resources For Cancer Patients and their Caregivers ? American Cancer Society: Can assist with transportation, wigs, general needs, runs Look Good Feel Better.        1-888-227-6333 ? Cancer Care: Provides financial assistance, online support groups, medication/co-pay assistance.  1-800-813-HOPE (4673) ? [facility] Assists  [place] cancer patients and their families through emotional , educational and financial support.  336-427-4357 ? [place] DSS Where to apply for food stamps, Medicaid and utility assistance. 336-342-1394 ? RC[facility]: Transportation to medical appointments. 336-347-2287 ? Social Security Administration: May apply for disability if have a Stage IV cancer. 336-342-7796 1-800-772-1213 ? [place] Aging, Disability and Transit Services: Assists with nutrition, care and transit needs. 336-349-2343  Cancer Center Support Programs: @[email or web address]@ > Cancer Support Group  2nd Tuesday of the month 1pm-2pm, Journey Room  > Creative Journey  3rd Tuesday of the month 1130am-1pm, Journey Room  > Look Good Feel Better  1st Wednesday of the month 10am-12 noon, Journey Room (Call American Cancer Society to register 1-800-395-5775)    

## 2017-06-05 NOTE — Progress Notes (Signed)
[address], [place] [address]   CLINIC:  Medical Oncology/Hematology  PCP:  [name], MD [place] [id number] [phone]   REASON FOR VISIT:  Follow-up for iron-deficiency anemia AND remote h/o left breast cancer (1992)  CURRENT THERAPY: IV iron prn AND observation   BRIEF ONCOLOGIC HISTORY:    Breast cancer ()    Initial Diagnosis    Breast cancer        HISTORY OF PRESENT ILLNESS:  (Per Kirby Crigler, PA-C's note on 12/20/15)    INTERVAL HISTORY:  Ms. Picha presents for follow-up of her iron-deficiency anemia and h/o left breast cancer s/p mastectomy and 5 years of Tamoxifen therapy.   Here today unaccompanied.  (of note: she is very hard of hearing and it makes getting an accurate history/ROS from patient).   Overall, she tells me she has been feeling "okay." Appetite 75%; energy levels 50%.  Denies any pain. She feels tired and weak at times; she has periodic hot flashes.  Denies any breast/chest wall masses or concerns.    Denies any frank bleeding episodes including blood in her stools, hematuria, vaginal bleeding, nosebleeds, or gingival bleeding. She tells me she has started taking a multivitamin with iron; she is tolerating this well.    Semi-recent hospitalization for acute GIB from 09/20/16-09/25/16. She underwent EGD which revealed a single non-bleeding angiodysplastic lesion in duodenum.  Capsule endoscopy performed and showed active bleeding at ~5 sites in jejunum secondary to AV malformations.    She tells me that her mammogram is up-to-date. She sates that she had to go back for diagnostic mammogram and ultrasound "and it scared me to death! But they told me everything looked fine after I went back and did another test."    REVIEW OF SYSTEMS:  Review of Systems  Constitutional: Positive for fatigue. Negative for chills and fever.  HENT:  Negative.   Eyes: Negative.   Respiratory:  Negative.  Negative for cough and shortness of breath.   Cardiovascular: Negative.  Negative for chest pain and leg swelling.  Gastrointestinal: Negative.  Negative for blood in stool.  Endocrine: Positive for hot flashes.  Genitourinary: Negative.  Negative for hematuria and vaginal bleeding.   Musculoskeletal: Negative.   Skin: Negative.   Neurological: Negative.   Hematological: Negative.   Psychiatric/Behavioral: Negative.      PAST MEDICAL/SURGICAL HISTORY:  Past Medical History:  Diagnosis Date  . Anemia    Hgb of 9-10  . Anemia of chronic disease    Hgb of 9-10 chronically; 06/2010: H&H-10.7/33.5, MCV-81, normal iron studies in 2010   . Arteriosclerotic cardiovascular disease (ASCVD)    Remote PTCA by patient report; LBBB; associated cardiomyopathy, presumed ischemic with EF 40-45% previously, 20% in 06/2009; h/o clinical congestive heart failure; negative stress nuclear in 2009 with inferoseptal and apical scar  . Automatic implantable cardioverter-defibrillator in situ   . Breast cancer ([place])    breast  . Chronic bronchitis ([place])   . Chronic renal disease, stage 3, moderately decreased glomerular filtration rate (GFR) between 30-59 mL/min/1.73 square meter (HCC) 02/01/2016  . Congestive heart failure (CHF) ([place])   . Elevated cholesterol   . Elevated sed rate   . GERD (gastroesophageal reflux disease)   . GI bleed   . Gout   . HOH (hard of hearing)   . Hyperlipidemia   . Hypertension   . Rheumatoid arthritis [facility])    Past Surgical History:  Procedure Laterality  Date  . BI-VENTRICULAR IMPLANTABLE CARDIOVERTER DEFIBRILLATOR N/A 07/28/2012   Procedure: BI-VENTRICULAR IMPLANTABLE CARDIOVERTER DEFIBRILLATOR  (CRT-D);  Surgeon: Evans Lance, MD;  Location: [facility] CATH LAB;  Service: Cardiovascular;  Laterality: N/A;  . BI-VENTRICULAR IMPLANTABLE CARDIOVERTER DEFIBRILLATOR  (CRT-D)  07/28/2012  . BREAST BIOPSY Bilateral   . CATARACT EXTRACTION W/ INTRAOCULAR LENS IMPLANT Left     . CATARACT EXTRACTION W/PHACO Right 09/15/2013   Procedure: CATARACT EXTRACTION PHACO AND INTRAOCULAR LENS PLACEMENT (IOC);  Surgeon: Elta Guadeloupe T. Gershon Crane, MD;  Location: [facility] ORS;  Service: Ophthalmology;  Laterality: Right;  CDE:  10.74  . COLONOSCOPY N/A 03/04/2015   Procedure: COLONOSCOPY;  Surgeon: Rogene Houston, MD;  Location: [facility] ENDO SUITE;  Service: Endoscopy;  Laterality: N/A;  10:50   . ESOPHAGOGASTRODUODENOSCOPY N/A 03/04/2015   Procedure: ESOPHAGOGASTRODUODENOSCOPY (EGD);  Surgeon: Rogene Houston, MD;  Location: [facility] ENDO SUITE;  Service: Endoscopy;  Laterality: N/A;  . ESOPHAGOGASTRODUODENOSCOPY N/A 09/21/2016   Procedure: ESOPHAGOGASTRODUODENOSCOPY (EGD);  Surgeon: Rogene Houston, MD;  Location: [facility] ENDO SU[facility];  Service: Endoscopy;  Laterality: N/A;  . GIVENS CAPSULE STUDY N/A 09/22/2016   Procedure: GIVENS CAPSULE STUDY;  Surgeon: Rogene Houston, MD;  Location: [facility] ENDO SU[facility];  Service: Endoscopy;  Laterality: N/A;  . MASTECTOMY Left 1998  . TOTAL KNEE ARTHROPLASTY Right    Dr.Harrison  . TUBAL LIGATION       SOCIAL HISTORY:  Social History   Socioeconomic History  . Marital status: Married    Spouse name: Not on file  . Number of children: Not on file  . Years of education: Not on file  . Highest education level: Not on file  Social Needs  . Financial resource strain: Not very hard  . Food insecurity - worry: Never true  . Food insecurity - inability: Never true  . Transportation needs - medical: No  . Transportation needs - non-medical: No  Occupational History  . Occupation: Retired  Tobacco Use  . Smoking status: Former Smoker    Packs/day: 0.25    Years: 30.00    Pack years: 7.50    Types: Cigarettes    Start date: 09/17/1956    Last attempt to quit: 04/24/2007    Years since quitting: 10.1  . Smokeless tobacco: Current User    Types: Chew  . Tobacco comment: 07/03/2013 "smoked some; don't know how much or how long or when I quit"  Substance and Sexual Activity   . Alcohol use: No    Alcohol/week: 0.0 oz  . Drug use: No  . Sexual activity: Not Currently    Birth control/protection: Post-menopausal  Other Topics Concern  . Not on file  Social History Narrative   Married, lives with spouse   1 daughter, died in 28 in a car accident   Retired - worked in [employer] fund[employer]   No recent travel    FAMILY HISTORY:  Family History  Problem Relation Age of Onset  . Diabetes Father   . Pancreatic cancer Father   . Hypertension Brother     CURRENT MEDICATIONS:  Outpatient Encounter Medications as of 06/05/2017  Medication Sig  . carvedilol (COREG) 6.25 MG tablet TAKE 1 TABLET BY MOUTH TWICE A DAY WITH MEALS  . furosemide (LASIX) 40 MG tablet Take 40 mg am and take 20 mg (1/2 tablet) in pm  . hydroxychloroquine (PLAQUENIL) 200 MG tablet TAKE 1 TABLET (200 MG TOTAL) BY MOUTH DAILY. [place]  . lisinopril (PRINIVIL,ZESTRIL) 20 MG tablet Take 10 mg by  mouth daily.   [name] lovastatin (MEVACOR) 40 MG tablet TAKE 1 TABLET (40 MG TOTAL) BY MOUTH AT BEDTIME.  [name] MAGNESIUM SULFATE, LAXATIVE, PO Take 500 mg daily by mouth.  . Multiple Vitamins-Minerals (MULTIVITAMIN WOMEN PO) Take 1 tablet daily by mouth.  . pantoprazole (PROTONIX) 20 MG tablet Take 1 tablet (20 mg total) by mouth daily.  . potassium chloride SA (K-DUR,KLOR-CON) 20 MEQ tablet Take 2 tablets (40 mEq total) by mouth daily.  [name] allopurinol (ZYLOPRIM) 100 MG tablet TAKE 1 TABLET (100 MG TOTAL) BY MOUTH DAILY.   No facility-administered encounter medications on file as of 06/05/2017.     ALLERGIES:  No Known Allergies   PHYSICAL EXAM:  ECOG Performance status: 2 - Symptomatic; requires occasional assistance.   Vitals:   06/05/17 1518  BP: (!) 140/59  Pulse: 73  Resp: 16  Temp: 98.6 F (37 C)  SpO2: 100%   Filed Weights   06/05/17 1518  Weight: 144 lb (65.3 kg)     Physical Exam  Constitutional: She is oriented to person, place, and time.  Chronically-ill appearing female in no  acute distress -Exam done with pt seated in chair   HENT:  Head: Normocephalic.  Mouth/Throat: Oropharynx is clear and moist. No oropharyngeal exudate.  Eyes: Conjunctivae are normal. Pupils are equal, round, and reactive to light. No scleral icterus.  Neck: Normal range of motion. Neck supple.  Cardiovascular: Normal rate and regular rhythm.  Pacemaker in place  Pulmonary/Chest: Effort normal and breath sounds normal. No respiratory distress.    Abdominal: Soft. Bowel sounds are normal. There is no tenderness.  Musculoskeletal: Normal range of motion. She exhibits no edema.  Lymphadenopathy:    She has no cervical adenopathy.       Right: No supraclavicular adenopathy present.       Left: No supraclavicular adenopathy present.  Neurological: She is alert and oriented to person, place, and time. No cranial nerve deficit. Gait normal.  Skin: Skin is warm and dry. No rash noted.  Psychiatric: Mood, memory, affect and judgment normal.  Nursing note and vitals reviewed.    LABORATORY DATA:  I have reviewed the labs as listed.  CBC    Component Value Date/Time   WBC 5.1 05/29/2017 1410   RBC 3.14 (L) 05/29/2017 1410   HGB 8.7 (L) 05/29/2017 1410   HCT 28.4 (L) 05/29/2017 1410   PLT 187 05/29/2017 1410   MCV 90.4 05/29/2017 1410   MCH 27.7 05/29/2017 1410   MCHC 30.6 05/29/2017 1410   RDW 18.3 (H) 05/29/2017 1410   LYMPHSABS 1.5 05/29/2017 1410   MONOABS 0.8 05/29/2017 1410   EOSABS 0.2 05/29/2017 1410   BASOSABS 0.0 05/29/2017 1410   CMP Latest Ref Rng & Units 05/29/2017 03/05/2017 01/15/2017  Glucose 65 - 99 mg/dL 93 115(H) 107(H)  BUN 6 - 20 mg/dL 45(H) 47(H) 37(H)  Creatinine 0.44 - 1.00 mg/dL 1.81(H) 2.27(H) 1.56(H)  Sodium 135 - 145 mmol/L 138 137 138  Potassium 3.5 - 5.1 mmol/L 4.5 5.0 4.4  Chloride 101 - 111 mmol/L 103 102 101  CO2 22 - 32 mmol/L '24 25 26  '$ Calcium 8.9 - 10.3 mg/dL 9.5 9.1 9.5  Total Protein 6.5 - 8.1 g/dL 8.2(H) 8.1 -  Total Bilirubin 0.3 - 1.2  mg/dL 0.4 0.4 -  Alkaline Phos 38 - 126 U/L 88 95 -  AST 15 - 41 U/L 25 24 -  ALT 14 - 54 U/L 20 19 -    PENDING LABS:  Iron studies pending    DIAGNOSTIC IMAGING:  Last mammogram: 04/02/17 CLINICAL DATA:  Screening recall for possible right breast mass.  EXAM: 2D DIGITAL DIAGNOSTIC UNILATERAL RIGHT MAMMOGRAM WITH CAD AND ADJUNCT TOMO  RIGHT BREAST ULTRASOUND  COMPARISON:  Previous exam(s).  ACR Breast Density Category b: There are scattered areas of fibroglandular density.  FINDINGS: Spot compression CC and MLO tomograms were performed of the right breast. The initially questioned possible right breast mass appears to resolve on the additional imaging with findings suggestive of an area of overlapping fibroglandular structures and likely prominent ducts.  Mammographic images were processed with CAD.  Physical examination of the subareolar right breast does not reveal any palpable masses.  Targeted ultrasound of the right breast was performed. No suspicious masses or abnormalities identified, only normal fibroglandular tissue and ducts identified. The entire central/retroareolar right breast was scanned.  IMPRESSION: No findings of malignancy in the right breast.  RECOMMENDATION: Recommend annual routine screening mammography, due November 2019.  I have discussed the findings and recommendations with the patient. Results were also provided in writing at the conclusion of the visit. If applicable, a reminder letter will be sent to the patient regarding the next appointment.  BI-RADS CATEGORY  1: Negative.   Electronically Signed   By: Everlean Alstrom M.D.   On: 04/02/2017 16:36    PATHOLOGY:     ASSESSMENT & PLAN:   Iron deficiency anemia:  -Likely secondary to chronic GI blood loss. Hospitalization within the past year for GIB in 09/2016; was found to have several bleeding lesions as well as AVMs. She has been seen at both [facility] and Dr.  Laural Golden for GI evaluations.  -Hgb 8.7 g/dL today. Chronic kidney disease may be playing a role as well; EGFR 30 today. Could consider ESA therapy in future, once iron studies are normal.  -Will repeat SPEP/IFE with her next labs as screening for myeloma (although I suspect the biggest etiologies of her anemia is iron deficiency with an element of CKD).  -Iron studies are pending. Last IV iron infusion was 2 doses of Feraheme in 02/2017. We will make arrangements for additional IV iron if needed. Goal is to keep her ferritin >100.  Oncology Flowsheet 03/12/2017 03/19/2017  ferumoxytol (FERAHEME) IV 510 mg 510 mg  -Return to cancer center in 3 months for follow-up with labs.    History of left breast cancer:  -No breast concerns today. She is now 16+ years out from her breast cancer diagnosis, which is reassuring.   -Clinical breast exam performed today and benign. She was recalled from her last screening mammogram; diagnostic mammogram in 03/2017 was negative.  -Next mammogram due in 02/2018 per radiology; will place orders at next follow-up visit.  -Due for unilateral right breast mammogram in 02/2018; will need to place orders for mammogram at next visit.       Dispo:  -Return to cancer center in 3 months with labs.    All questions were answered to patient's stated satisfaction. Encouraged patient to call with any new concerns or questions before her next visit to the cancer center and we can certain see her sooner, if needed.       Orders placed this encounter:  Orders Placed This Encounter  Procedures  . CBC with Differential/Platelet  . Comprehensive metabolic panel  . Immunofixation electrophoresis  . IgG, IgA, IgM  . Protein electrophoresis, serum  . Kappa/lambda light chains  . Ferritin  . Iron and TIBC  Mike Craze, NP [place] [phone]

## 2017-06-09 ENCOUNTER — Encounter (HOSPITAL_COMMUNITY): Payer: Self-pay | Admitting: Adult Health

## 2017-06-10 NOTE — Telephone Encounter (Signed)
[place] to check the status of pts application. Spoke with [facility] who states that they have not received her application yet. They program has been having some system issues the last couple of weeks .She gave me another fax number to use to send the application. [phone].   Re-faxed application. Will update once we receive a response.   [name], [place], CPhT 9:05 AM

## 2017-06-10 NOTE — Telephone Encounter (Signed)
Received a fax from [facility] stating that pt has been approved to receive [facility] free of charge through 04/22/2018. Pt mus re-enroll again for the following year. Pt should receive her first order within the next few days from their pharmacy distributor, [facility].   Will send document to scan center.  Called pts son to update. Left voicemail.  Noorah Giammona, [place], CPhT 10:11 AM

## 2017-06-12 DIAGNOSIS — I10 Essential (primary) hypertension: Secondary | ICD-10-CM | POA: Diagnosis not present

## 2017-06-12 DIAGNOSIS — J8410 Pulmonary fibrosis, unspecified: Secondary | ICD-10-CM | POA: Diagnosis not present

## 2017-06-12 DIAGNOSIS — M069 Rheumatoid arthritis, unspecified: Secondary | ICD-10-CM | POA: Diagnosis not present

## 2017-06-12 DIAGNOSIS — I5022 Chronic systolic (congestive) heart failure: Secondary | ICD-10-CM | POA: Diagnosis not present

## 2017-06-18 LAB — CUP PACEART REMOTE DEVICE CHECK
Battery Remaining Longevity: 46 mo
Battery Voltage: 2.97 V
Brady Statistic AP VP Percent: 17.69 %
Brady Statistic RA Percent Paced: 18.19 %
HighPow Impedance: 85 Ohm
Implantable Lead Implant Date: 20140407
Implantable Lead Location: 753860
Implantable Lead Model: 4194
Implantable Lead Model: 5076
Implantable Lead Model: 6935
Lead Channel Impedance Value: 399 Ohm
Lead Channel Impedance Value: 855 Ohm
Lead Channel Pacing Threshold Amplitude: 1 V
Lead Channel Pacing Threshold Pulse Width: 0.4 ms
Lead Channel Pacing Threshold Pulse Width: 0.5 ms
Lead Channel Sensing Intrinsic Amplitude: 1.75 mV
Lead Channel Sensing Intrinsic Amplitude: 1.75 mV
Lead Channel Sensing Intrinsic Amplitude: 23.375 mV
Lead Channel Sensing Intrinsic Amplitude: 23.375 mV
Lead Channel Setting Pacing Amplitude: 1.5 V
Lead Channel Setting Pacing Pulse Width: 0.5 ms
Lead Channel Setting Sensing Sensitivity: 0.3 mV
MDC IDC LEAD IMPLANT DT: 20140407
MDC IDC LEAD IMPLANT DT: 20140407
MDC IDC LEAD LOCATION: 753858
MDC IDC LEAD LOCATION: 753859
MDC IDC MSMT LEADCHNL LV IMPEDANCE VALUE: 342 Ohm
MDC IDC MSMT LEADCHNL LV IMPEDANCE VALUE: 646 Ohm
MDC IDC MSMT LEADCHNL LV PACING THRESHOLD AMPLITUDE: 0.5 V
MDC IDC MSMT LEADCHNL RA PACING THRESHOLD AMPLITUDE: 0.625 V
MDC IDC MSMT LEADCHNL RA PACING THRESHOLD PULSEWIDTH: 0.4 ms
MDC IDC MSMT LEADCHNL RV IMPEDANCE VALUE: 399 Ohm
MDC IDC MSMT LEADCHNL RV IMPEDANCE VALUE: 532 Ohm
MDC IDC PG IMPLANT DT: 20140407
MDC IDC SESS DTM: 20190204083326
MDC IDC SET LEADCHNL RA PACING AMPLITUDE: 2 V
MDC IDC SET LEADCHNL RV PACING AMPLITUDE: 2.5 V
MDC IDC SET LEADCHNL RV PACING PULSEWIDTH: 0.4 ms
MDC IDC STAT BRADY AP VS PERCENT: 0.84 %
MDC IDC STAT BRADY AS VP PERCENT: 79.95 %
MDC IDC STAT BRADY AS VS PERCENT: 1.52 %
MDC IDC STAT BRADY RV PERCENT PACED: 11.77 %

## 2017-06-27 ENCOUNTER — Ambulatory Visit (INDEPENDENT_AMBULATORY_CARE_PROVIDER_SITE_OTHER): Payer: Medicare HMO

## 2017-06-27 DIAGNOSIS — I5042 Chronic combined systolic (congestive) and diastolic (congestive) heart failure: Secondary | ICD-10-CM

## 2017-06-27 DIAGNOSIS — Z95810 Presence of automatic (implantable) cardiac defibrillator: Secondary | ICD-10-CM

## 2017-06-27 NOTE — Progress Notes (Signed)
EPIC Encounter for ICM Monitoring  Patient Name: [name] is a 77 y.o. female Date: 06/27/2017 Primary Care Physican: [name], MD Primary Cardiologist:Koneswaran/Lawrence NP Electrophysiologist: Lovena Le Dry Weight:unknown Bi-V Pacing: 92.4%      Attempted call to nephew [name].   Left detailed message regarding transmission.  Transmission reviewed.    Thoracic impedance slightly below baseline suggesting fluid accumulation.   Prescribed dosage: Furosemide 40 mg 1 tablet (40 mg total) every AM and 0.5 tablet (20 mg total) every PM.Klor Con 20 mEq 2tablets (40 mEq)daily.  Labs:  05/29/2017 Creatinine 1.81, BUN 45, Potassium 4.5, Sodium 138, EGFR 26-30 03/05/2017 Creatinine 2.27, BUN 47, Potassium 5.0, Sodium 137, EGFR 20-23 01/15/2017 Creatinine 1.56, BUN 37, Potassium 4.4, Sodium 139, EGFR 31-36 11/29/2016 Creatinine 1.68, BUN 30, Potassium 4.1, Sodium 138, EGFR 28-33 10/23/2016 Creatinine 2.08 BUN 31, Potassium 5.3, Sodium 136, EGFR 22-25 10/15/2016 Creatinine 0.99, BUN 18, Potassium 3.0, Sodium 136, EGFR 53->60 10/08/2016 Creatinine 0.95, BUN 16, Potassium 3.1, Sodium 140, EGFR 56->60 09/23/2016 Creatinine 0.74, BUN 13, Potassium 4.0, Sodium 141, EGFR >60 09/22/2016 Creatinine 0.90, BUN 15, Potassium 3.9, Sodium 141, EGFR >60  09/21/2016 Creatinine 1.05, BUN 26, Potassium 3.7, Sodium 140, EGFR 50-57  09/20/2016 Creatinine 1.53, BUN 37, Potassium 3.9, Sodium 140, EGFR 31-36 09/05/2016 Creatinine 1.25, BUN 29, Potassium 4.0, Sodium 137, EGFR 40-47  08/08/2016 Creatinine 1.10, BUN 24, Potassium 3.6, Sodium 136, EGFR 47-55 07/19/2016 Creatinine 1.26, BUN 29, Potassium 3.6, Sodium 139, EGFR 40-46 06/22/2016 Creatinine 1.44, BUN 37, Potassium 3.7, Sodium 140, EGFR 34-39 06/01/2016 Creatinine 1.68, BUN 43, Potassium 4.7, Sodium 132, EGFR 28-33  Recommendations: NONE - Unable to reach.  Follow-up plan: ICM clinic phone appointment on 07/11/2017 to recheck  fluid levels.    Copy of ICM check sent to Dr. Lovena Le and Dr [name].   3 month ICM trend: 06/27/2017    1 Year ICM trend:       [name], RN 06/27/2017 1:02 PM

## 2017-06-28 NOTE — Progress Notes (Signed)
Office Visit Note  Patient: [name]             Date of Birth: 1941-03-13           MRN: [id number]             PCP: [name], MD Referring: [name], MD Visit Date: 07/12/2017 Occupation: '@GUAROCC' @    Subjective:  Medication Management   History of Present Illness: [name] is a 77 y.o. female with history of rheumatoid arthritis, osteoarthritis and gout.  She states she has noticed that she cannot left her right third fourth and fifth finger for the last 2 months.  She denies any joint pain or swelling.  She does not have much joint discomfort.  She denies any gout flares since the last visit.  Her right total knee replacement is doing well.  Activities of Daily Living:  Patient reports morning stiffness for 15 minutes.   Patient Denies nocturnal pain.  Difficulty dressing/grooming: Denies Difficulty climbing stairs: Denies Difficulty getting out of chair: Denies Difficulty using hands for taps, buttons, cutlery, and/or writing: Reports   Review of Systems  Constitutional: Negative for fatigue, night sweats, weight gain and weight loss.  HENT: Negative for mouth sores, trouble swallowing, trouble swallowing, mouth dryness and nose dryness.   Eyes: Negative for pain, redness, visual disturbance and dryness.  Respiratory: Negative for cough, hemoptysis, shortness of breath and difficulty breathing.   Cardiovascular: Negative for chest pain, palpitations, hypertension, irregular heartbeat and swelling in legs/feet.  Gastrointestinal: Negative for abdominal pain, blood in stool, constipation and diarrhea.  Endocrine: Negative for increased urination.  Genitourinary: Negative for pelvic pain and vaginal dryness.  Musculoskeletal: Positive for morning stiffness. Negative for arthralgias, joint pain, joint swelling, myalgias, muscle weakness, muscle tenderness and myalgias.  Skin: Negative for color change, rash, hair loss, skin tightness, ulcers and  sensitivity to sunlight.  Allergic/Immunologic: Negative for susceptible to infections.  Neurological: Negative for dizziness, numbness, headaches, memory loss, night sweats and weakness.  Hematological: Negative for bruising/bleeding tendency and swollen glands.  Psychiatric/Behavioral: Negative for depressed mood, confusion and sleep disturbance. The patient is not nervous/anxious.     PMFS History:  Patient Active Problem List   Diagnosis Date Noted  . Acute blood loss anemia 09/20/2016  . Ischemic cardiomyopathy 09/20/2016  . Chronic combined systolic and diastolic CHF (congestive heart failure) ([place]) 09/20/2016  . CKD (chronic kidney disease) stage 3, GFR 30-59 ml/min (HCC) 09/20/2016  . History of breast cancer 08/02/2016  . Primary osteoarthritis of both knees 08/02/2016  . Primary osteoarthritis of both feet 08/02/2016  . History of anemia 07/17/2016  . History of CHF (congestive heart failure) 06/04/2016  . History of chronic kidney disease 06/04/2016  . Symptomatic anemia 06/04/2016  . Elevated sedimentation rate 03/03/2016  . Idiopathic chronic gout of multiple sites without tophus 03/03/2016  . Total knee replacement status, right 03/03/2016  . High risk medication use 03/03/2016  . Chronic kidney disease (CKD) stage G3b/A1, moderately decreased glomerular filtration rate (GFR) between 30-44 mL/min/1.73 square meter and albuminuria creatinine ratio less than 30 mg/g (HCC) 02/01/2016  . CAD S/P remote PCI- no details 09/02/2014  . Cardiomyopathy, ischemic-EF 30-35% March 2015 09/02/2014  . Diastolic dysfunction-grade 2 09/02/2014  . CHF exacerbation ([place]) 08/28/2014  . Acute on chronic combined systolic and diastolic congestive heart failure ([place]) 08/28/2014  . Iron deficiency anemia 08/20/2013  . Malnutrition of moderate degree ([place]) 07/21/2013  . PNA (pneumonia) 07/19/2013  . HCAP (  healthcare-associated pneumonia) 07/17/2013  . Sepsis ([place]) 07/17/2013  . ARF (acute  renal failure) ([place]) 07/17/2013  . Rheumatoid arthritis ([place]) 07/04/2013  . Hypokalemia 07/03/2013  . Chest pain 07/03/2013  . Biventricular ICD in place (MDT 2014) 11/06/2012  . Anemia of chronic disease   . Breast cancer ([place])   . Hypertension   . Dyslipidemia 05/24/2009  . Chronic systolic heart failure ([place]) 05/24/2009  . Primary osteoarthritis of both hands 08/11/2007    Past Medical History:  Diagnosis Date  . Anemia    Hgb of 9-10  . Anemia of chronic disease    Hgb of 9-10 chronically; 06/2010: H&H-10.7/33.5, MCV-81, normal iron studies in 2010   . Arteriosclerotic cardiovascular disease (ASCVD)    Remote PTCA by patient report; LBBB; associated cardiomyopathy, presumed ischemic with EF 40-45% previously, 20% in 06/2009; h/o clinical congestive heart failure; negative stress nuclear in 2009 with inferoseptal and apical scar  . Automatic implantable cardioverter-defibrillator in situ   . Breast cancer ([place])    breast  . Chronic bronchitis ([place])   . Chronic renal disease, stage 3, moderately decreased glomerular filtration rate (GFR) between 30-59 mL/min/1.73 square meter (HCC) 02/01/2016  . Congestive heart failure (CHF) ([place])   . Elevated cholesterol   . Elevated sed rate   . GERD (gastroesophageal reflux disease)   . GI bleed   . Gout   . HOH (hard of hearing)   . Hyperlipidemia   . Hypertension   . Rheumatoid arthritis ([place])     Family History  Problem Relation Age of Onset  . Diabetes Father   . Pancreatic cancer Father   . Hypertension Brother    Past Surgical History:  Procedure Laterality Date  . BI-VENTRICULAR IMPLANTABLE CARDIOVERTER DEFIBRILLATOR N/A 07/28/2012   Procedure: BI-VENTRICULAR IMPLANTABLE CARDIOVERTER DEFIBRILLATOR  (CRT-D);  Surgeon: Evans Lance, MD;  Location[facility] CATH LAB;  Service: Cardiovascular;  Laterality: N/A;  . BI-VENTRICULAR IMPLANTABLE CARDIOVERTER DEFIBRILLATOR  (CRT-D)  07/28/2012  . BREAST BIOPSY Bilateral   . CATARACT  EXTRACTION W/ INTRAOCULAR LENS IMPLANT Left   . CATARACT EXTRACTION W/PHACO Right 09/15/2013   Procedure: CATARACT EXTRACTION PHACO AND INTRAOCULAR LENS PLACEMENT (IOC);  Surgeon: Elta Guadeloupe T. Gershon Crane, MD;  Location: [facility] ORS;  Service: Ophthalmology;  Laterality: Right;  CDE:  10.74  . COLONOSCOPY N/A 03/04/2015   Procedure: COLONOSCOPY;  Surgeon: Rogene Houston, MD;  Location: [facility] ENDO SUITE;  Service: Endoscopy;  Laterality: N/A;  10:50   . ESOPHAGOGASTRODUODENOSCOPY N/A 03/04/2015   Procedure: ESOPHAGOGASTRODUODENOSCOPY (EGD);  Surgeon: Rogene Houston, MD;  Location: [facility] ENDO SUITE;  Service: Endoscopy;  Laterality: N/A;  . ESOPHAGOGASTRODUODENOSCOPY N/A 09/21/2016   Procedure: ESOPHAGOGASTRODUODENOSCOPY (EGD);  Surgeon: Rogene Houston, MD;  Location: [facility] ENDO SU[facility];  Service: Endoscopy;  Laterality: N/A;  . GIVENS CAPSULE STUDY N/A 09/22/2016   Procedure: GIVENS CAPSULE STUDY;  Surgeon: Rogene Houston, MD;  Location: [facility];  Service: Endoscopy;  Laterality: N/A;  . MASTECTOMY Left 1998  . TOTAL KNEE ARTHROPLASTY Right    Dr.Harrison  . TUBAL LIGATION     Social History   Social History Narrative   Married, lives with spouse   1 daughter, died in 8 in a car accident   Retired - worked in [employer]   No recent travel     Objective: Vital Signs: BP 115/66 (BP Location: Right Arm, Patient Position: Sitting, Cuff Size: Normal)   Pulse 66   Resp 16   Ht '5\' 5"'  (1.651 m)  Wt 145 lb (65.8 kg)   BMI 24.13 kg/m    Physical Exam  Constitutional: She is oriented to person, place, and time. She appears well-developed and well-nourished.  HENT:  Head: Normocephalic and atraumatic.  Eyes: Conjunctivae and EOM are normal.  Neck: Normal range of motion.  Cardiovascular: Normal rate, regular rhythm, normal heart sounds and intact distal pulses.  Pulmonary/Chest: Effort normal and breath sounds normal.  Abdominal: Soft. Bowel sounds are normal.  Lymphadenopathy:    She has no cervical  adenopathy.  Neurological: She is alert and oriented to person, place, and time.  Skin: Skin is warm and dry. Capillary refill takes less than 2 seconds.  Psychiatric: She has a normal mood and affect. Her behavior is normal.  Nursing note and vitals reviewed.    Musculoskeletal Exam: C-spine thoracic lumbar spine limited range of motion.  She has discomfort range of motion of bilateral shoulder joints.  Elbow joints were in good range of motion.  She has synovitis over bilateral wrist joints and some of the MCP joints as described below.  She also had warmth and swelling in her bilateral knee joints.  CDAI Exam: CDAI Homunculus Exam:   Tenderness:  RUE: glenohumeral and wrist LUE: glenohumeral and wrist Right hand: 2nd MCP and 3rd MCP Left hand: 3rd MCP RLE: tibiofemoral LLE: tibiofemoral Right foot: 2nd MTP  Swelling:  RUE: wrist LUE: wrist Right hand: 2nd MCP and 3rd MCP Left hand: 3rd MCP RLE: tibiofemoral LLE: tibiofemoral Right foot: 2nd MTP  Joint Counts:  CDAI Tender Joint count: 9 CDAI Swollen Joint count: 7  Global Assessments:  Patient Global Assessment: 5 Provider Global Assessment: 7  CDAI Calculated Score: 28   Investigation: No additional findings.   Imaging: No results found.  Speciality Comments: PLQ eye exam: 01/08/2017 Normal. [employer]. Follow up in 6 months.    Procedures:  No procedures performed Allergies: Patient has no known allergies.   Assessment / Plan:     Visit Diagnoses: Rheumatoid arthritis involving multiple sites with positive rheumatoid factor (HCC) - +RF, +CCP, Her methotrexate was discontinued due to low GFR.  Patient has severe inflammatory arthritis and having a flare currently with multiple joint swelling.  She has been very hesitant to take any medications in the past including Biologics.  We had detailed discussion regarding severe nature of her disease causing extensor tendon rupture in her right  hand.  She may consider Biologics in future.  I would wait until she has her hand surgery.  We will review her insurance and treatment options today.  High risk medication use - PLQ 100 mg by mouth daily Monday through Friday.  Her labs have been stable except for anemia.  She has been followed up by hematology.  Rupture of extensor tendon of right hand, initial encounter -patient believes that this rupture happened about 6 weeks ago.  We contacted the hand surgery office and they will schedule her appointment next week.  They will contact patient.  Plan: Ambulatory referral to Hand Surgery  Primary osteoarthritis of both knees: She has severe pain and discomfort in her bilateral knee joints.  She also has a component of inflammation.  Primary osteoarthritis of both feet: Chronic pain  Idiopathic chronic gout of multiple sites without tophus - allopurinol 100 mg by mouth daily.  She denies having any recent flare.  Total knee replacement status, right - Dr. Aline Brochure.   Elevated sedimentation rate - ESR> 100 followed by oncologist    Chronic  kidney disease (CKD) stage G3b/A1, moderately decreased glomerular filtration rate (GFR) between 30-44 mL/min/1.73 square meter and albuminuria creatinine ratio less than 30 mg/g (HCC)  Dyslipidemia  History of anemia  Chronic systolic heart failure ([employer])  History of breast cancer - Dr.Penland1992 with no recurrence.   Orders: Orders Placed This Encounter  Procedures  . Ambulatory referral to Hand Surgery   No orders of the defined types were placed in this encounter.   Face-to-face time spent with patient was 30 minutes.  Greater than 50% of time was spent in counseling and coordination of care.  Follow-Up Instructions: Return in about 2 months (around 09/11/2017) for Rheumatoid arthritis, Osteoarthritis, Gout.   Bo Merino, MD  Note - This record has been created using Editor, commissioning.  Chart creation errors have been sought, but  may not always  have been located. Such creation errors do not reflect on  the standard of medical care.

## 2017-07-11 ENCOUNTER — Ambulatory Visit (INDEPENDENT_AMBULATORY_CARE_PROVIDER_SITE_OTHER): Payer: Self-pay

## 2017-07-11 DIAGNOSIS — Z95810 Presence of automatic (implantable) cardiac defibrillator: Secondary | ICD-10-CM

## 2017-07-11 DIAGNOSIS — I5042 Chronic combined systolic (congestive) and diastolic (congestive) heart failure: Secondary | ICD-10-CM

## 2017-07-11 NOTE — Progress Notes (Signed)
EPIC Encounter for ICM Monitoring  Patient Name: [name] is a 77 y.o. female Date: 07/11/2017 Primary Care Physican: [name], MD Primary Cardiologist:Koneswaran/Lawrence NP Electrophysiologist: Lovena Le Dry Weight:unknown Bi-V Pacing: 91.2%                                               Attempted call to nephew [name].   Attempted call to patient and unable to reach.  Left detailed message regarding transmission.  Transmission reviewed.    Thoracic impedance normal.  Prescribed dosage: Furosemide 40 mg 1 tablet (40 mg total) every AM and 0.5 tablet (20 mg total) every PM.Klor Con 20 mEq 2tablets (40 mEq)daily.  Labs:  05/29/2017 Creatinine 1.81, BUN 45, Potassium 4.5, Sodium 138, EGFR 26-30 03/05/2017 Creatinine 2.27, BUN 47, Potassium 5.0, Sodium 137, EGFR 20-23 01/15/2017 Creatinine 1.56, BUN 37, Potassium 4.4, Sodium 139, EGFR 31-36 11/29/2016 Creatinine 1.68, BUN 30, Potassium 4.1, Sodium 138, EGFR 28-33 10/23/2016 Creatinine 2.08 BUN 31, Potassium 5.3, Sodium 136, EGFR 22-25 10/15/2016 Creatinine 0.99, BUN 18, Potassium 3.0, Sodium 136, EGFR 53->60 10/08/2016 Creatinine 0.95, BUN 16, Potassium 3.1, Sodium 140, EGFR 56->60 09/23/2016 Creatinine 0.74, BUN 13, Potassium 4.0, Sodium 141, EGFR >60 09/22/2016 Creatinine 0.90, BUN 15, Potassium 3.9, Sodium 141, EGFR >60  09/21/2016 Creatinine 1.05, BUN 26, Potassium 3.7, Sodium 140, EGFR 50-57  09/20/2016 Creatinine 1.53, BUN 37, Potassium 3.9, Sodium 140, EGFR 31-36 09/05/2016 Creatinine 1.25, BUN 29, Potassium 4.0, Sodium 137, EGFR 40-47  08/08/2016 Creatinine 1.10, BUN 24, Potassium 3.6, Sodium 136, EGFR 47-55 07/19/2016 Creatinine 1.26, BUN 29, Potassium 3.6, Sodium 139, EGFR 40-46 06/22/2016 Creatinine 1.44, BUN 37, Potassium 3.7, Sodium 140, EGFR 34-39 06/01/2016 Creatinine 1.68, BUN 43, Potassium 4.7, Sodium 132, EGFR 28-33  Recommendations: Left voice mail with ICM number and encouraged to  call if experiencing any fluid symptoms.  Follow-up plan: ICM clinic phone appointment on 07/29/2017.  Office appointment scheduled 08/28/2017 with Dr. [name].  Copy of ICM check sent to Dr. Lovena Le.   3 month ICM trend: 07/11/2017    1 Year ICM trend:       [name], RN 07/11/2017 1:40 PM

## 2017-07-12 ENCOUNTER — Ambulatory Visit (INDEPENDENT_AMBULATORY_CARE_PROVIDER_SITE_OTHER): Payer: Medicare HMO | Admitting: Rheumatology

## 2017-07-12 ENCOUNTER — Encounter: Payer: Self-pay | Admitting: Rheumatology

## 2017-07-12 ENCOUNTER — Telehealth: Payer: Self-pay

## 2017-07-12 VITALS — BP 115/66 | HR 66 | Resp 16 | Ht 65.0 in | Wt 145.0 lb

## 2017-07-12 DIAGNOSIS — M1A09X0 Idiopathic chronic gout, multiple sites, without tophus (tophi): Secondary | ICD-10-CM | POA: Diagnosis not present

## 2017-07-12 DIAGNOSIS — M19072 Primary osteoarthritis, left ankle and foot: Secondary | ICD-10-CM

## 2017-07-12 DIAGNOSIS — I5022 Chronic systolic (congestive) heart failure: Secondary | ICD-10-CM | POA: Diagnosis not present

## 2017-07-12 DIAGNOSIS — E785 Hyperlipidemia, unspecified: Secondary | ICD-10-CM

## 2017-07-12 DIAGNOSIS — Z853 Personal history of malignant neoplasm of breast: Secondary | ICD-10-CM

## 2017-07-12 DIAGNOSIS — R700 Elevated erythrocyte sedimentation rate: Secondary | ICD-10-CM | POA: Diagnosis not present

## 2017-07-12 DIAGNOSIS — Z96651 Presence of right artificial knee joint: Secondary | ICD-10-CM

## 2017-07-12 DIAGNOSIS — M19071 Primary osteoarthritis, right ankle and foot: Secondary | ICD-10-CM | POA: Diagnosis not present

## 2017-07-12 DIAGNOSIS — N1832 Chronic kidney disease, stage 3b: Secondary | ICD-10-CM

## 2017-07-12 DIAGNOSIS — M0579 Rheumatoid arthritis with rheumatoid factor of multiple sites without organ or systems involvement: Secondary | ICD-10-CM | POA: Diagnosis not present

## 2017-07-12 DIAGNOSIS — S66811A Strain of other specified muscles, fascia and tendons at wrist and hand level, right hand, initial encounter: Secondary | ICD-10-CM

## 2017-07-12 DIAGNOSIS — M170 Bilateral primary osteoarthritis of knee: Secondary | ICD-10-CM

## 2017-07-12 DIAGNOSIS — Z862 Personal history of diseases of the blood and blood-forming organs and certain disorders involving the immune mechanism: Secondary | ICD-10-CM | POA: Diagnosis not present

## 2017-07-12 DIAGNOSIS — N183 Chronic kidney disease, stage 3 (moderate): Secondary | ICD-10-CM | POA: Diagnosis not present

## 2017-07-12 DIAGNOSIS — Z79899 Other long term (current) drug therapy: Secondary | ICD-10-CM | POA: Diagnosis not present

## 2017-07-12 NOTE — Telephone Encounter (Signed)
Remote ICM transmission received.  Attempted call to nephew, [name], and left detailed message per [facility] regarding transmission and next ICM scheduled for 07/29/2017.  Advised to return call for any fluid symptoms or questions.

## 2017-07-16 DIAGNOSIS — I10 Essential (primary) hypertension: Secondary | ICD-10-CM | POA: Diagnosis not present

## 2017-07-16 DIAGNOSIS — I5022 Chronic systolic (congestive) heart failure: Secondary | ICD-10-CM | POA: Diagnosis not present

## 2017-07-16 DIAGNOSIS — R300 Dysuria: Secondary | ICD-10-CM | POA: Diagnosis not present

## 2017-07-16 DIAGNOSIS — J40 Bronchitis, not specified as acute or chronic: Secondary | ICD-10-CM | POA: Diagnosis not present

## 2017-07-16 DIAGNOSIS — K219 Gastro-esophageal reflux disease without esophagitis: Secondary | ICD-10-CM | POA: Diagnosis not present

## 2017-07-16 DIAGNOSIS — N390 Urinary tract infection, site not specified: Secondary | ICD-10-CM | POA: Diagnosis not present

## 2017-07-18 DIAGNOSIS — S66811A Strain of other specified muscles, fascia and tendons at wrist and hand level, right hand, initial encounter: Secondary | ICD-10-CM | POA: Diagnosis not present

## 2017-07-29 ENCOUNTER — Ambulatory Visit (INDEPENDENT_AMBULATORY_CARE_PROVIDER_SITE_OTHER): Payer: Medicare HMO

## 2017-07-29 DIAGNOSIS — I5042 Chronic combined systolic (congestive) and diastolic (congestive) heart failure: Secondary | ICD-10-CM

## 2017-07-29 DIAGNOSIS — Z95810 Presence of automatic (implantable) cardiac defibrillator: Secondary | ICD-10-CM

## 2017-07-29 NOTE — Progress Notes (Signed)
EPIC Encounter for ICM Monitoring  Patient Name: [name] is a 77 y.o. female Date: 07/29/2017 Primary Care Physican: [name], MD Primary Cardiologist:Koneswaran/Lawrence NP Electrophysiologist: Lovena Le Dry Weight:unknown Bi-V Pacing: 92.6%      Attempted call to nephew [name].   Left detailed message regarding transmission.  Transmission reviewed.    Thoracic impedance normal.  Prescribed dosage: Furosemide 40 mg 1 tablet (40 mg total) every AM and 0.5 tablet (20 mg total) every PM.Klor Con 20 mEq 2tablets (40 mEq)daily.  Labs:  05/29/2017 Creatinine 1.81, BUN 45, Potassium 4.5, Sodium 138, EGFR 26-30 03/05/2017 Creatinine 2.27, BUN 47, Potassium 5.0, Sodium 137, EGFR 20-23 01/15/2017 Creatinine 1.56, BUN 37, Potassium 4.4, Sodium 139, EGFR 31-36 11/29/2016 Creatinine 1.68, BUN 30, Potassium 4.1, Sodium 138, EGFR 28-33 10/23/2016 Creatinine 2.08 BUN 31, Potassium 5.3, Sodium 136, EGFR 22-25 10/15/2016 Creatinine 0.99, BUN 18, Potassium 3.0, Sodium 136, EGFR 53->60 10/08/2016 Creatinine 0.95, BUN 16, Potassium 3.1, Sodium 140, EGFR 56->60 09/23/2016 Creatinine 0.74, BUN 13, Potassium 4.0, Sodium 141, EGFR >60 09/22/2016 Creatinine 0.90, BUN 15, Potassium 3.9, Sodium 141, EGFR >60  09/21/2016 Creatinine 1.05, BUN 26, Potassium 3.7, Sodium 140, EGFR 50-57  09/20/2016 Creatinine 1.53, BUN 37, Potassium 3.9, Sodium 140, EGFR 31-36 09/05/2016 Creatinine 1.25, BUN 29, Potassium 4.0, Sodium 137, EGFR 40-47  08/08/2016 Creatinine 1.10, BUN 24, Potassium 3.6, Sodium 136, EGFR 47-55 07/19/2016 Creatinine 1.26, BUN 29, Potassium 3.6, Sodium 139, EGFR 40-46 06/22/2016 Creatinine 1.44, BUN 37, Potassium 3.7, Sodium 140, EGFR 34-39 06/01/2016 Creatinine 1.68, BUN 43, Potassium 4.7, Sodium 132, EGFR 28-33  Recommendations:  Left voice mail with ICM number and encouraged to call if experiencing any fluid symptoms.  Follow-up plan: ICM clinic phone appointment on  08/29/2017.  Office appointment scheduled 08/28/2017 with Dr. [name].  Copy of ICM check sent to Dr. Lovena Le.   3 month ICM trend: 07/29/2017    1 Year ICM trend:       [name], RN 07/29/2017 12:31 PM

## 2017-08-02 ENCOUNTER — Other Ambulatory Visit: Payer: Self-pay | Admitting: Orthopedic Surgery

## 2017-08-06 DIAGNOSIS — I5022 Chronic systolic (congestive) heart failure: Secondary | ICD-10-CM | POA: Diagnosis not present

## 2017-08-06 DIAGNOSIS — N390 Urinary tract infection, site not specified: Secondary | ICD-10-CM | POA: Diagnosis not present

## 2017-08-06 DIAGNOSIS — R300 Dysuria: Secondary | ICD-10-CM | POA: Diagnosis not present

## 2017-08-06 DIAGNOSIS — M069 Rheumatoid arthritis, unspecified: Secondary | ICD-10-CM | POA: Diagnosis not present

## 2017-08-08 DIAGNOSIS — M069 Rheumatoid arthritis, unspecified: Secondary | ICD-10-CM | POA: Diagnosis not present

## 2017-08-08 DIAGNOSIS — L278 Dermatitis due to other substances taken internally: Secondary | ICD-10-CM | POA: Diagnosis not present

## 2017-08-08 NOTE — Pre-Procedure Instructions (Signed)
LAQUEISHA CATALINA  08/08/2017      [id number] - [place], [place] - [address] [place] [address] Phone: [phone] Fax: [phone]    Your procedure is scheduled on Thursday, April 25th   Report to [facility] Admitting at 11:30 AM             (posted surgery time 1:30p - 3p)   Call this number if you have problems the morning of surgery:  [phone]   Remember:              4-5 days prior to surgery, STOP TAKING any Vitamins, Herbal Supplements, Anti-inflammatories, Blood Thinners.   Do not eat food or drink liquids after midnight, Wednesday.   Take these medicines the morning of surgery with A SIP OF WATER : Coreg, Protonix   Do not wear jewelry, make-up or nail polish.  Do not wear lotions, powders,  perfumes, or deodorant.  Do not shave 48 hours prior to surgery.     Do not bring valuables to the hospital.  [facility] is not responsible for any belongings or valuables.  Contacts, dentures or bridgework may not be worn into surgery.  Leave your suitcase in the car.  After surgery it may be brought to your room.  For patients admitted to the hospital, discharge time will be determined by your treatment team.  Patients discharged the day of surgery will not be allowed to drive home, and you will need someone to stay with you for the first 24 hrs.  Please read over the following fact sheets that you were given. Pain Booklet and Surgical Site Infection Prevention       [facility]- Preparing For Surgery  Before surgery, you can play an important role. Because skin is not sterile, your skin needs to be as free of germs as possible. You can reduce the number of germs on your skin by washing with CHG (chlorahexidine gluconate) Soap before surgery.  CHG is an antiseptic cleaner which kills germs and bonds with the skin to continue killing germs even after washing.  Please do not use if you have an allergy to  CHG or antibacterial soaps. If your skin becomes reddened/irritated stop using the CHG.  Do not shave (including legs and underarms) for at least 48 hours prior to first CHG shower. It is OK to shave your face.  Please follow these instructions carefully.   1. Shower the NIGHT BEFORE SURGERY and the MORNING OF SURGERY with CHG.   2. If you chose to wash your hair, wash your hair first as usual with your normal shampoo.  3. After you shampoo, rinse your hair and body thoroughly to remove the shampoo.  4. Use CHG as you would any other liquid soap. You can apply CHG directly to the skin and wash gently with a scrungie or a clean washcloth.   5. Apply the CHG Soap to your body ONLY FROM THE NECK DOWN.  Do not use on open wounds or open sores. Avoid contact with your eyes, ears, mouth and genitals (private parts). Wash Face and genitals (private parts)  with your normal soap.  6. Wash thoroughly, paying special attention to the area where your surgery will be performed.  7. Thoroughly rinse your body with warm water from the neck down.  8. DO NOT shower/wash with your normal soap after using and rinsing off the CHG Soap.  9. Pat yourself  dry with a CLEAN TOWEL.  10. Wear CLEAN PAJAMAS to bed the night before surgery, wear comfortable clothes the morning of surgery  11. Place CLEAN SHEETS on your bed the night of your first shower and DO NOT SLEEP WITH PETS.    Day of Surgery: Do not apply any deodorants/lotions. Please wear clean clothes to the hospital/surgery center.

## 2017-08-08 NOTE — Progress Notes (Addendum)
Anesthesia Chart Review:  Case:  [id number] Date/Time:  08/15/17 1315   Procedure:  TENDON TRANSFER RIGHT WRIST EXTENSORS AS NEEDED, ULNA RESECTION AND STABILIZATION (Right )   Anesthesia type:  General   Pre-op diagnosis:  RUPTURE MULTIPLE EXTENSOR TENDONS RIGHT WRIST   Location:  [facility] OR ROOM 08 / [place] OR   Surgeon:  Charlotte Crumb, MD     DISCUSSION: Patient is a 77 year old female scheduled for the above procedure. History includes former smoker (quit '09), CAD (MI s/p PCI 1996?, details unknown), ischemic cardiomyopathy, chronic systolic CHF, left BBB, BiV ICD ([employer] 07/28/12), HTN, HLD, CKD stage II-III, iron deficiency anemia, RA, chronic bronchitis, breast cancer (s/p left radical mastectomy '92), GI bleed (bleeding jejunum AVM 09/23/16 s/p Argon plasma 11/08/16).    Cardiologist Dr. [name] did sign a letter of cardiac clearance (scanned under Media tab).  Labs from this afternoon's PAT visit reviewed after 5:00 PM. K 6.1, BUN 65, Cr 2.73 (previously BUN 45, Cr 1.81). Dr. Josephine Cables office is closed currently; however, with K > 6 with acute on chronic kidney disease, I think she will need further evaluation in the ED. I called patient's preferred phone number which is her nephew [name]. He was with patient at her PAT visit and had just dropped her off at home. He reported that she prefers calls be directed to him because she can not hear well on the phone. Discussed with Mr. Philipp Ovens that she should be taken to the ED for treatment of elevated potassium (hyperkalemia) and if not treated could lead to dangerous arrhythmias. Her acute change in renal function will also need to be addressed. He reports that patient was seen by Dr. Legrand Rams just yesterday as a walk-in due to feet swelling and itching while on nitrofurantoin for UTI symptoms. That drug was stopped. He does not think patient sees a nephrologist. He confirms he will take patient to [facility] ED this evening. I have also notified the [facility]  ED charge RN of her planned arrival. Anesthesiologist Dr. Oleta Mouse updated. Chart will be left for follow-up on Monday 08/12/17.   Addendum 08/14/17 12:40 PM: Patient was admitted to [facility] 08/09/17-08/10/17 for hyperkalemia and acute on chronic kidney disease. By notes, she appeared hypovolemic on admission. She was gently hydrated, and Lasix and lisinopril held. By discharge, K 4.5 and Creatinine down to 2.19 (previously ~ 1.8). On 08/12/17, I left a voice message with Dr. Legrand Rams regarding admission and surgery plans and asked for call back regarding recommendations. I also updated Elmo Putt at Dr. Bertis Ruddy office regarding labs and events. Today, Kim from Dr. Josephine Cables office called. She reported that patient walked in to their clinic on 08/13/17 to discuss medication changes at hospital discharge. Per Maudie Mercury, Dr. Legrand Rams ordered repeat labs which he reviewed today and felt patient was okay to proceed with her planned 08/15/17 surgery from a medical standpoint. The labs faxed from his office (drawn 08/13/17) include a BUN 18, Cr 0.99 (H), K 4.0, WBC 5.2, RBC 2.93 (L), HGB 8.3 (L), HCT 25.4 (L), PLT 186. Her H/H trends documented in Epic since 03/05/17 has been 8.0/25.7-->8.7/28.4-->9.2/29.3-->9.6/30.8 (treated for GI AVM 10/2016).   Labs reviewed with anesthesiologist Dr. Oren Bracket. Patient has right wrist tendon rupture with need for repair. Procedure should have an anticipated low EBL amount and labs trends show persistent anemia; however, with underlying CAD and evaluation/treatment of GI bleed within the past year would recommend consider either postponing surgery to ensure anemia improving (if surgery is  elective) versus getting ISTAT4 on the day of surgery to re-evaluate H/H stability. If repeat labs showed any significant change (drop) in her H/H or there were signs of symptomatic anemia then may need to consider postponing surgery date or treating more acutely if surgery not considered elective. I have  updated Jeani Hawking at Dr. Bertis Ruddy office regarding labs and anesthesiologist recommendations/possible options. If remains as scheduled, then patient will get an ISTAT on arrival and definitive plan following anesthesiologist evaluation on the day of surgery. I have updated our RN flow coordinator so assigned RN can notify anesthesiologist once lab results available. I also refaxed ICD perioperative device form to [facility] device clinic.   VS: BP (!) 97/39   Pulse 81   Temp 37.2 C (Oral)   Resp 17   Ht 5\' 4"  (1.626 m)   Wt 143 lb 14.4 oz (65.3 kg)   SpO2 100%   BMI 24.70 kg/m   PROVIDERS: - [name], [facility] as PCP - Kate Sable, MD as Primary Cardiologist. Last visit 02/20/17. Her CAD was felt to be symptomatically stable at that time. He recommended continued medical therapy. She was not on ASA, presumably due to fairly recent GI bleed. She was overdue to EP follow-up, so device interrogation arranged with EP office visit recommended for 10/2016, although patient needs to schedule.  Cristopher Peru, MD as EP Cardiologist. By notes, ICD interrogation was done 05/2017. ICM monitoring revealed normal thoracic impedance on 07/29/17.  Estanislado Pandy, Shaili as Rheumatologist. Twana First, MD as HEM-ONC. Last visit on 06/05/17 with Mike Craze, NP. Trudee Grip, MD as GI.  LABS: As above, patient referred to ED for further evaluation of worsening renal function and hyperkalemia. She remains anemic, but slightly improved since 05/2017.  (all labs ordered are listed, but only abnormal results are displayed)  Labs Reviewed  SURGICAL PCR SCREEN - Abnormal; Notable for the following components:      Result Value   MRSA, PCR POSITIVE (*)    Staphylococcus aureus POSITIVE (*)    All other components within normal limits  BASIC METABOLIC PANEL - Abnormal; Notable for the following components:   Potassium 6.1 (*)    CO2 16 (*)    BUN 65 (*)    Creatinine, Ser 2.73 (*)    GFR calc  non Af Amer 16 (*)    GFR calc Af Amer 18 (*)    All other components within normal limits  CBC - Abnormal; Notable for the following components:   RBC 3.33 (*)    Hemoglobin 9.2 (*)    HCT 29.3 (*)    RDW 16.3 (*)    All other components within normal limits   (SEE ABOVE ADDENDUM AND RESULT REVIEW TAB FOR UPDATED LABS SINCE 08/09/2017 PAT VISIT.)  IMAGES: No results found.   OTHER: PFTs 09/29/15: FVC 1.83 (101%), FEV1 1.36 (98%), DLCO unc 8.50 (42%).  EKG: 10/15/16 SR, old anterolateral infarct, prominent R in V1, posterior extension of lateral MI, superior axis, probably secondary to infarct, consider RVH, possible posterior fascicular block (possibel bifascicular block), non-specific T wave abnormality.  CV: Echo 07/01/15: Study Conclusions - Left ventricle: The cavity size was normal. Wall thickness was   increased in a pattern of mild LVH. Systolic function was   moderately to severely reduced. The estimated ejection fraction   was in the range of 30% to 35%. Diffuse hypokinesis. Doppler   parameters are consistent with abnormal left ventricular   relaxation (grade 1 diastolic  dysfunction). Doppler parameters   are consistent with high ventricular filling pressure. - Regional wall motion abnormality: Akinesis of the basal inferior   myocardium. - Aortic valve: Mildly to moderately calcified annulus. Trileaflet. - Mitral valve: There was mild regurgitation. - Left atrium: The atrium was mildly dilated. - Right ventricle: Pacer wire or catheter noted in right ventricle. - Right atrium: Pacer wire or catheter noted in right atrium. - Tricuspid valve: There was mild regurgitation. - Pulmonary arteries: PA peak pressure: 27 mm Hg (S).  Nuclear stress test 07/16/07: IMPRESSION:  Adenosine Myoview electrically nondiagnostic for ischemia. Myoview scan with evidence of scar in the septal, inferior, and apical distributions. No evidence for inducible ischemia. LVF on gating calculated  at 37%.   Past Medical History:  Diagnosis Date  . Anemia    Hgb of 9-10  . Anemia of chronic disease    Hgb of 9-10 chronically; 06/2010: H&H-10.7/33.5, MCV-81, normal iron studies in 2010   . Arteriosclerotic cardiovascular disease (ASCVD)    Remote PTCA by patient report; LBBB; associated cardiomyopathy, presumed ischemic with EF 40-45% previously, 20% in 06/2009; h/o clinical congestive heart failure; negative stress nuclear in 2009 with inferoseptal and apical scar  . Automatic implantable cardioverter-defibrillator in situ   . Breast cancer ([place])    breast  . Chronic bronchitis ([place])   . Chronic renal disease, stage 3, moderately decreased glomerular filtration rate (GFR) between 30-59 mL/min/1.73 square meter (HCC) 02/01/2016  . Congestive heart failure (CHF) ([place])   . Elevated cholesterol   . Elevated sed rate   . GERD (gastroesophageal reflux disease)   . GI bleed   . Gout   . HOH (hard of hearing)   . Hyperlipidemia   . Hypertension   . Rheumatoid arthritis ([place])   . UTI (urinary tract infection)     Past Surgical History:  Procedure Laterality Date  . BI-VENTRICULAR IMPLANTABLE CARDIOVERTER DEFIBRILLATOR N/A 07/28/2012   Procedure: BI-VENTRICULAR IMPLANTABLE CARDIOVERTER DEFIBRILLATOR  (CRT-D);  Surgeon: Evans Lance, MD;  Location[facility] CATH LAB;  Service: Cardiovascular;  Laterality: N/A;  . BI-VENTRICULAR IMPLANTABLE CARDIOVERTER DEFIBRILLATOR  (CRT-D)  07/28/2012  . BREAST BIOPSY Bilateral   . CATARACT EXTRACTION W/ INTRAOCULAR LENS IMPLANT Left   . CATARACT EXTRACTION W/PHACO Right 09/15/2013   Procedure: CATARACT EXTRACTION PHACO AND INTRAOCULAR LENS PLACEMENT (IOC);  Surgeon: Elta Guadeloupe T. Gershon Crane, MD;  Location: [facility] ORS;  Service: Ophthalmology;  Laterality: Right;  CDE:  10.74  . COLONOSCOPY N/A 03/04/2015   Procedure: COLONOSCOPY;  Surgeon: Rogene Houston, MD;  Location: [facility] ENDO SUITE;  Service: Endoscopy;  Laterality: N/A;  10:50   . ESOPHAGOGASTRODUODENOSCOPY N/A  03/04/2015   Procedure: ESOPHAGOGASTRODUODENOSCOPY (EGD);  Surgeon: Rogene Houston, MD;  Location: [facility] ENDO SUITE;  Service: Endoscopy;  Laterality: N/A;  . ESOPHAGOGASTRODUODENOSCOPY N/A 09/21/2016   Procedure: ESOPHAGOGASTRODUODENOSCOPY (EGD);  Surgeon: Rogene Houston, MD;  Location: [facility] ENDO SUITE;  Service: Endoscopy;  Laterality: N/A;  . GIVENS CAPSULE STUDY N/A 09/22/2016   Procedure: GIVENS CAPSULE STUDY;  Surgeon: Rogene Houston, MD;  Location: [facility] ENDO SUITE;  Service: Endoscopy;  Laterality: N/A;  . MASTECTOMY Left 1998  . TOTAL KNEE ARTHROPLASTY Right    Dr.Harrison  . TUBAL LIGATION    - right TKA '06  MEDICATIONS: . diphenhydrAMINE (BENADRYL) 25 mg capsule  . acetaminophen (TYLENOL) 500 MG tablet  . allopurinol (ZYLOPRIM) 100 MG tablet  . carvedilol (COREG) 6.25 MG tablet  . colchicine (COLCRYS) 0.6 MG tablet  . furosemide (LASIX)  40 MG tablet  . hydroxychloroquine (PLAQUENIL) 200 MG tablet  . lisinopril (PRINIVIL,ZESTRIL) 20 MG tablet  . lovastatin (MEVACOR) 40 MG tablet  . Magnesium 500 MG CAPS  . Multiple Vitamin (MULTIVITAMIN WITH MINERALS) TABS tablet  . nitrofurantoin, macrocrystal-monohydrate, (MACROBID) 100 MG capsule  . pantoprazole (PROTONIX) 20 MG tablet  . potassium chloride SA (K-DUR,KLOR-CON) 20 MEQ tablet   No current facility-administered medications for this encounter.   Dr. Legrand Rams had her discontinue nitrofurantoin on 08/08/17 due to the side effects.   [facility] Stay [facility]/Anesthesiology Phone [phone] 08/09/2017 6:29 PM

## 2017-08-09 ENCOUNTER — Encounter (HOSPITAL_COMMUNITY)
Admission: RE | Admit: 2017-08-09 | Discharge: 2017-08-09 | Disposition: A | Discharge status: Home / Self Care | Payer: Medicare HMO | Source: Ambulatory Visit | Attending: Orthopedic Surgery | Admitting: Orthopedic Surgery

## 2017-08-09 ENCOUNTER — Encounter (HOSPITAL_COMMUNITY): Payer: Self-pay

## 2017-08-09 ENCOUNTER — Encounter (HOSPITAL_COMMUNITY): Payer: Self-pay | Admitting: Emergency Medicine

## 2017-08-09 ENCOUNTER — Observation Stay (HOSPITAL_COMMUNITY)
Admission: EM | Admit: 2017-08-09 | Discharge: 2017-08-10 | Disposition: A | Discharge status: Home / Self Care | Payer: Medicare HMO | Attending: Internal Medicine | Admitting: Internal Medicine

## 2017-08-09 ENCOUNTER — Other Ambulatory Visit: Payer: Self-pay

## 2017-08-09 DIAGNOSIS — Z9861 Coronary angioplasty status: Secondary | ICD-10-CM

## 2017-08-09 DIAGNOSIS — M069 Rheumatoid arthritis, unspecified: Secondary | ICD-10-CM | POA: Insufficient documentation

## 2017-08-09 DIAGNOSIS — Z79899 Other long term (current) drug therapy: Secondary | ICD-10-CM | POA: Diagnosis not present

## 2017-08-09 DIAGNOSIS — N179 Acute kidney failure, unspecified: Secondary | ICD-10-CM | POA: Diagnosis not present

## 2017-08-09 DIAGNOSIS — Z01812 Encounter for preprocedural laboratory examination: Secondary | ICD-10-CM | POA: Diagnosis present

## 2017-08-09 DIAGNOSIS — I5042 Chronic combined systolic (congestive) and diastolic (congestive) heart failure: Secondary | ICD-10-CM | POA: Insufficient documentation

## 2017-08-09 DIAGNOSIS — Z853 Personal history of malignant neoplasm of breast: Secondary | ICD-10-CM | POA: Insufficient documentation

## 2017-08-09 DIAGNOSIS — Z95810 Presence of automatic (implantable) cardiac defibrillator: Secondary | ICD-10-CM | POA: Diagnosis not present

## 2017-08-09 DIAGNOSIS — I10 Essential (primary) hypertension: Secondary | ICD-10-CM | POA: Diagnosis not present

## 2017-08-09 DIAGNOSIS — E875 Hyperkalemia: Secondary | ICD-10-CM | POA: Diagnosis not present

## 2017-08-09 DIAGNOSIS — N184 Chronic kidney disease, stage 4 (severe): Secondary | ICD-10-CM | POA: Insufficient documentation

## 2017-08-09 DIAGNOSIS — Z87891 Personal history of nicotine dependence: Secondary | ICD-10-CM | POA: Diagnosis not present

## 2017-08-09 DIAGNOSIS — I2510 Atherosclerotic heart disease of native coronary artery without angina pectoris: Secondary | ICD-10-CM | POA: Insufficient documentation

## 2017-08-09 DIAGNOSIS — R21 Rash and other nonspecific skin eruption: Secondary | ICD-10-CM | POA: Diagnosis not present

## 2017-08-09 DIAGNOSIS — D631 Anemia in chronic kidney disease: Secondary | ICD-10-CM | POA: Diagnosis not present

## 2017-08-09 DIAGNOSIS — I130 Hypertensive heart and chronic kidney disease with heart failure and stage 1 through stage 4 chronic kidney disease, or unspecified chronic kidney disease: Secondary | ICD-10-CM | POA: Diagnosis not present

## 2017-08-09 DIAGNOSIS — D638 Anemia in other chronic diseases classified elsewhere: Secondary | ICD-10-CM | POA: Diagnosis present

## 2017-08-09 DIAGNOSIS — Z96651 Presence of right artificial knee joint: Secondary | ICD-10-CM | POA: Insufficient documentation

## 2017-08-09 HISTORY — DX: Urinary tract infection, site not specified: N39.0

## 2017-08-09 LAB — CBC
HCT: 29.3 % — ABNORMAL LOW (ref 36.0–46.0)
Hemoglobin: 9.2 g/dL — ABNORMAL LOW (ref 12.0–15.0)
MCH: 27.6 pg (ref 26.0–34.0)
MCHC: 31.4 g/dL (ref 30.0–36.0)
MCV: 88 fL (ref 78.0–100.0)
Platelets: 204 10*3/uL (ref 150–400)
RBC: 3.33 MIL/uL — ABNORMAL LOW (ref 3.87–5.11)
RDW: 16.3 % — ABNORMAL HIGH (ref 11.5–15.5)
WBC: 7.7 10*3/uL (ref 4.0–10.5)

## 2017-08-09 LAB — URINALYSIS, ROUTINE W REFLEX MICROSCOPIC
Bilirubin Urine: NEGATIVE
Glucose, UA: NEGATIVE mg/dL
HGB URINE DIPSTICK: NEGATIVE
Ketones, ur: NEGATIVE mg/dL
Nitrite: NEGATIVE
PROTEIN: NEGATIVE mg/dL
Specific Gravity, Urine: 1.011 (ref 1.005–1.030)
pH: 5 (ref 5.0–8.0)

## 2017-08-09 LAB — BASIC METABOLIC PANEL
Anion gap: 11 (ref 5–15)
Anion gap: 12 (ref 5–15)
BUN: 65 mg/dL — ABNORMAL HIGH (ref 6–20)
BUN: 71 mg/dL — AB (ref 6–20)
CALCIUM: 9 mg/dL (ref 8.9–10.3)
CO2: 16 mmol/L — ABNORMAL LOW (ref 22–32)
CO2: 18 mmol/L — ABNORMAL LOW (ref 22–32)
CREATININE: 2.88 mg/dL — AB (ref 0.44–1.00)
Calcium: 9 mg/dL (ref 8.9–10.3)
Chloride: 108 mmol/L (ref 101–111)
Chloride: 109 mmol/L (ref 101–111)
Creatinine, Ser: 2.73 mg/dL — ABNORMAL HIGH (ref 0.44–1.00)
GFR calc Af Amer: 18 mL/min — ABNORMAL LOW (ref >60)
GFR calc Af Amer: 17 mL/min — ABNORMAL LOW (ref >60)
GFR calc non Af Amer: 16 mL/min — ABNORMAL LOW (ref >60)
GFR calc non Af Amer: 15 mL/min — ABNORMAL LOW (ref >60)
GLUCOSE: 103 mg/dL — AB (ref 65–99)
Glucose, Bld: 84 mg/dL (ref 65–99)
Potassium: 6.1 mmol/L — ABNORMAL HIGH (ref 3.5–5.1)
Potassium: 5.4 mmol/L — ABNORMAL HIGH (ref 3.5–5.1)
Sodium: 135 mmol/L (ref 135–145)
Sodium: 139 mmol/L (ref 135–145)

## 2017-08-09 LAB — CBC WITH DIFFERENTIAL/PLATELET
Basophils Absolute: 0 10*3/uL (ref 0.0–0.1)
Basophils Relative: 0 %
EOS PCT: 10 %
Eosinophils Absolute: 0.8 10*3/uL — ABNORMAL HIGH (ref 0.0–0.7)
HEMATOCRIT: 30.8 % — AB (ref 36.0–46.0)
Hemoglobin: 9.6 g/dL — ABNORMAL LOW (ref 12.0–15.0)
LYMPHS PCT: 18 %
Lymphs Abs: 1.4 10*3/uL (ref 0.7–4.0)
MCH: 27.6 pg (ref 26.0–34.0)
MCHC: 31.2 g/dL (ref 30.0–36.0)
MCV: 88.5 fL (ref 78.0–100.0)
MONO ABS: 0.5 10*3/uL (ref 0.1–1.0)
MONOS PCT: 6 %
NEUTROS ABS: 5.3 10*3/uL (ref 1.7–7.7)
Neutrophils Relative %: 66 %
PLATELETS: 189 10*3/uL (ref 150–400)
RBC: 3.48 MIL/uL — ABNORMAL LOW (ref 3.87–5.11)
RDW: 16.2 % — AB (ref 11.5–15.5)
WBC: 7.9 10*3/uL (ref 4.0–10.5)

## 2017-08-09 LAB — SURGICAL PCR SCREEN
MRSA, PCR: POSITIVE — AB
Staphylococcus aureus: POSITIVE — AB

## 2017-08-09 MED ORDER — SODIUM CHLORIDE 0.9 % IV BOLUS
1000.0000 mL | Freq: Once | INTRAVENOUS | Status: AC
  Administered 2017-08-09: 1000 mL via INTRAVENOUS

## 2017-08-09 NOTE — Progress Notes (Signed)
PCP - Dr. Brandon Melnick  Cardiologist - Dr. [name]  Chest x-ray - Denies  EKG - 10/15/16 (E)  Stress Test - Denies  ECHO - 07/01/15 (E)  Cardiac Cath - Denies  Sleep Study - Denies CPAP - None  LABS- 08/09/17: CBC, BMP  Anesthesia- Yes- cardiac history  Pt denies having chest pain, sob, or fever at this time. All instructions explained to the pt, with a verbal understanding of the material. Pt agrees to go over the instructions while at home for a better understanding. The opportunity to ask questions was provided.

## 2017-08-09 NOTE — ED Provider Notes (Signed)
[facility] EMERGENCY DEPARTMENT Provider Note   CSN: [id number] Arrival date & time: 08/09/17  2032     History   Chief Complaint Chief Complaint  Patient presents with  . Abnormal Lab    HPI [name] is a 77 y.o. female.  Patient sent to the emergency department for evaluation of elevated creatinine and potassium.  Patient reportedly had preoperative labs performed as an outpatient today, was called at home and told to come to the ER for a recheck.  She reports that she was put on methotrexate briefly last year because of her rheumatoid arthritis.  Her creatinine apparently worsened after a week or so of using it and it was stopped.  She reports that she has not seen a nephrologist or had her kidney functions checked recently.    Patient reports that she was experiencing urinary frequency and incontinence, saw her doctor in the office earlier this week.  She was put on an antibiotic that caused her to have itching, she is not sure if she had an actual rash.  She stopped this medicine.  She is not sure what antibiotic it was.     Past Medical History:  Diagnosis Date  . Anemia    Hgb of 9-10  . Anemia of chronic disease    Hgb of 9-10 chronically; 06/2010: H&H-10.7/33.5, MCV-81, normal iron studies in 2010   . Arteriosclerotic cardiovascular disease (ASCVD)    Remote PTCA by patient report; LBBB; associated cardiomyopathy, presumed ischemic with EF 40-45% previously, 20% in 06/2009; h/o clinical congestive heart failure; negative stress nuclear in 2009 with inferoseptal and apical scar  . Automatic implantable cardioverter-defibrillator in situ   . Breast cancer ([place])    breast  . Chronic bronchitis ([place])   . Chronic renal disease, stage 3, moderately decreased glomerular filtration rate (GFR) between 30-59 mL/min/1.73 square meter (HCC) 02/01/2016  . Congestive heart failure (CHF) ([place])   . Elevated cholesterol   . Elevated sed rate   . GERD (gastroesophageal reflux  disease)   . GI bleed   . Gout   . HOH (hard of hearing)   . Hyperlipidemia   . Hypertension   . Rheumatoid arthritis ([place])   . UTI (urinary tract infection)     Patient Active Problem List   Diagnosis Date Noted  . Acute blood loss anemia 09/20/2016  . Ischemic cardiomyopathy 09/20/2016  . Chronic combined systolic and diastolic CHF (congestive heart failure) ([place]) 09/20/2016  . CKD (chronic kidney disease) stage 3, GFR 30-59 ml/min (HCC) 09/20/2016  . History of breast cancer 08/02/2016  . Primary osteoarthritis of both knees 08/02/2016  . Primary osteoarthritis of both feet 08/02/2016  . History of anemia 07/17/2016  . History of CHF (congestive heart failure) 06/04/2016  . History of chronic kidney disease 06/04/2016  . Symptomatic anemia 06/04/2016  . Elevated sedimentation rate 03/03/2016  . Idiopathic chronic gout of multiple sites without tophus 03/03/2016  . Total knee replacement status, right 03/03/2016  . High risk medication use 03/03/2016  . Chronic kidney disease (CKD) stage G3b/A1, moderately decreased glomerular filtration rate (GFR) between 30-44 mL/min/1.73 square meter and albuminuria creatinine ratio less than 30 mg/g (HCC) 02/01/2016  . CAD S/P remote PCI- no details 09/02/2014  . Cardiomyopathy, ischemic-EF 30-35% March 2015 09/02/2014  . Diastolic dysfunction-grade 2 09/02/2014  . CHF exacerbation ([place]) 08/28/2014  . Acute on chronic combined systolic and diastolic congestive heart failure ([place]) 08/28/2014  . Iron deficiency anemia 08/20/2013  . Malnutrition  of moderate degree ([place]) 07/21/2013  . PNA (pneumonia) 07/19/2013  . HCAP (healthcare-associated pneumonia) 07/17/2013  . Sepsis ([place]) 07/17/2013  . ARF (acute renal failure) ([place]) 07/17/2013  . Rheumatoid arthritis ([place]) 07/04/2013  . Hypokalemia 07/03/2013  . Chest pain 07/03/2013  . Biventricular ICD in place (MDT 2014) 11/06/2012  . Anemia of chronic disease   . Breast cancer ([place])   .  Hypertension   . Dyslipidemia 05/24/2009  . Chronic systolic heart failure ([place]) 05/24/2009  . Primary osteoarthritis of both hands 08/11/2007    Past Surgical History:  Procedure Laterality Date  . BI-VENTRICULAR IMPLANTABLE CARDIOVERTER DEFIBRILLATOR N/A 07/28/2012   Procedure: BI-VENTRICULAR IMPLANTABLE CARDIOVERTER DEFIBRILLATOR  (CRT-D);  Surgeon: Evans Lance, MD;  Location: [facility] CATH [facility];  Service: Cardiovascular;  Laterality: N/A;  . BI-VENTRICULAR IMPLANTABLE CARDIOVERTER DEFIBRILLATOR  (CRT-D)  07/28/2012  . BREAST BIOPSY Bilateral   . CATARACT EXTRACTION W/ INTRAOCULAR LENS IMPLANT Left   . CATARACT EXTRACTION W/PHACO Right 09/15/2013   Procedure: CATARACT EXTRACTION PHACO AND INTRAOCULAR LENS PLACEMENT (IOC);  Surgeon: Elta Guadeloupe T. Gershon Crane, MD;  Location: [facility] ORS;  Service: Ophthalmology;  Laterality: Right;  CDE:  10.74  . COLONOSCOPY N/A 03/04/2015   Procedure: COLONOSCOPY;  Surgeon: Rogene Houston, MD;  Location: [facility] ENDO SUITE;  Service: Endoscopy;  Laterality: N/A;  10:50   . ESOPHAGOGASTRODUODENOSCOPY N/A 03/04/2015   Procedure: ESOPHAGOGASTRODUODENOSCOPY (EGD);  Surgeon: Rogene Houston, MD;  Location: [facility] END[facility] SU[facility];  Service: Endoscopy;  Laterality: N/A;  . ESOPHAGOGASTRODUODENOSCOPY N/A 09/21/2016   Procedure: ESOPHAGOGASTRODUODENOSCOPY (EGD);  Surgeon: Rogene Houston, MD;  Location: [facility];  Service: Endoscopy;  Laterality: N/A;  . GIVENS CAPSULE STUDY N/A 09/22/2016   Procedure: GIVENS CAPSULE STUDY;  Surgeon: Rogene Houston, MD;  Location: [facility] END[facility];  Service: Endoscopy;  Laterality: N/A;  . MASTECTOMY Left 1998  . TOTAL KNEE ARTHROPLASTY Right    Dr.Harrison  . TUBAL LIGATION       OB History    Gravida  1   Para  1   Term  1   Preterm      AB      Living  0     SAB      TAB      Ectopic      Multiple      Live Births               Home Medications    Prior to Admission medications   Medication Sig Start Date End Date Taking?  Authorizing Provider  acetaminophen (TYLENOL) 500 MG tablet Take 500 mg by mouth every 6 (six) hours as needed (FOR PAIN/HEADACHES).   Yes [provider]  allopurinol (ZYLOPRIM) 100 MG tablet TAKE 1 TABLET (100 MG TOTAL) BY MOUTH DAILY. 12/11/16 08/07/18 Yes Deveshwar, Abel Presto, MD  carvedilol (COREG) 6.25 MG tablet TAKE 1 TABLET BY MOUTH TWICE A DAY WITH MEALS 05/16/17  Yes Herminio Commons, MD  colchicine (COLCRYS) 0.6 MG tablet Take 0.6 mg by mouth daily.   Yes [provider]  diphenhydrAMINE (BENADRYL) 25 mg capsule Take 25 mg by mouth 2 (two) times daily as needed for itching or allergies.    Yes [provider]  furosemide (LASIX) 40 MG tablet Take 40 mg am and take 20 mg (1/2 tablet) in pm Patient taking differently: Take 40 mg by mouth daily.  10/15/16  Yes Lendon Colonel, NP  hydroxychloroquine (PLAQUENIL) 200 MG tablet TAKE 1 TABLET (200 MG TOTAL) BY  MOUTH DAILY. [place] Patient taking differently: Take 200 mg by mouth See admin instructions. TAKE 1 TABLET (200 MG TOTAL) BY MOUTH DAILY. MONDAY THRU FRIDAY 05/27/17  Yes Ofilia Neas, PA-C  lisinopril (PRINIVIL,ZESTRIL) 20 MG tablet Take 20 mg by mouth daily.  04/27/16  Yes [provider]  lovastatin (MEVACOR) 40 MG tablet TAKE 1 TABLET (40 MG TOTAL) BY MOUTH AT BEDTIME. 05/16/17  Yes Herminio Commons, MD  Magnesium 500 MG CAPS Take 500 mg by mouth at bedtime.   Yes [provider]  nitrofurantoin (MACRODANTIN) 100 MG capsule Take 100 mg by mouth 2 (two) times daily. 5 day course starting on 08/06/2017 08/06/17  Yes [provider]  pantoprazole (PROTONIX) 20 MG tablet Take 1 tablet (20 mg total) by mouth daily. 11/20/16  Yes Lendon Colonel, NP  potassium chloride SA (K-DUR,KLOR-CON) 20 MEQ tablet Take 2 tablets (40 mEq total) by mouth daily. Patient taking differently: Take 20 mEq by mouth 2 (two) times daily.  10/15/16  Yes Lendon Colonel, NP    Family  History Family History  Problem Relation Age of Onset  . Diabetes Father   . Pancreatic cancer Father   . Hypertension Brother     Social History Social History   Tobacco Use  . Smoking status: Former Smoker    Packs/day: 0.25    Years: 30.00    Pack years: 7.50    Types: Cigarettes    Start date: 09/17/1956    Last attempt to quit: 04/24/2007    Years since quitting: 10.3  . Smokeless tobacco: Current User    Types: Chew  . Tobacco comment: 07/03/2013 "smoked some; don't know how much or how long or when I quit"  Substance Use Topics  . Alcohol use: No    Alcohol/week: 0.0 oz  . Drug use: No     Allergies   Macrobid [nitrofurantoin macrocrystal]   Review of Systems Review of Systems  Genitourinary: Positive for frequency.  Skin: Positive for rash.  All other systems reviewed and are negative.    Physical Exam Updated Vital Signs BP (!) 123/46 (BP Location: Right Arm)   Pulse 81   Temp 98.8 F (37.1 C) (Oral)   Resp 16   Ht 5\' 4"  (1.626 m)   Wt 64.9 kg (143 lb)   SpO2 98%   BMI 24.55 kg/m   Physical Exam  Constitutional: She is oriented to person, place, and time. She appears well-developed and well-nourished. No distress.  HENT:  Head: Normocephalic and atraumatic.  Right Ear: Hearing normal.  Left Ear: Hearing normal.  Nose: Nose normal.  Mouth/Throat: Oropharynx is clear and moist and mucous membranes are normal.  Eyes: Pupils are equal, round, and reactive to light. Conjunctivae and EOM are normal.  Neck: Normal range of motion. Neck supple.  Cardiovascular: Regular rhythm, S1 normal and S2 normal. Exam reveals no gallop and no friction rub.  No murmur heard. Pulmonary/Chest: Effort normal and breath sounds normal. No respiratory distress. She exhibits no tenderness.  Abdominal: Soft. Normal appearance and bowel sounds are normal. There is no hepatosplenomegaly. There is no tenderness. There is no rebound, no guarding, no tenderness at McBurney's  point and negative Murphy's sign. No hernia.  Musculoskeletal: Normal range of motion.  Neurological: She is alert and oriented to person, place, and time. She has normal strength. No cranial nerve deficit or sensory deficit. Coordination normal. GCS eye subscore is 4. GCS verbal subscore is 5. GCS motor  subscore is 6.  Skin: Skin is warm, dry and intact. No rash noted. No cyanosis.  Psychiatric: She has a normal mood and affect. Her speech is normal and behavior is normal. Thought content normal.  Nursing note and vitals reviewed.    ED Treatments / Results  Labs (all labs ordered are listed, but only abnormal results are displayed) Labs Reviewed  CBC WITH DIFFERENTIAL/PLATELET - Abnormal; Notable for the following components:      Result Value   RBC 3.48 (*)    Hemoglobin 9.6 (*)    HCT 30.8 (*)    RDW 16.2 (*)    Eosinophils Absolute 0.8 (*)    All other components within normal limits  BASIC METABOLIC PANEL - Abnormal; Notable for the following components:   Potassium 5.4 (*)    CO2 18 (*)    Glucose, Bld 103 (*)    BUN 71 (*)    Creatinine, Ser 2.88 (*)    GFR calc non Af Amer 15 (*)    GFR calc Af Amer 17 (*)    All other components within normal limits  URINALYSIS, ROUTINE W REFLEX MICROSCOPIC - Abnormal; Notable for the following components:   Leukocytes, UA TRACE (*)    Bacteria, UA RARE (*)    Squamous Epithelial / LPF 0-5 (*)    All other components within normal limits  URINE CULTURE    EKG EKG Interpretation  Date/Time:  Saturday August 10 2017 00:39:51 EDT Ventricular Rate:  83 PR Interval:    QRS Duration: 126 QT Interval:  417 QTC Calculation: 490 R Axis:   -122 Text Interpretation:  Atrial-sensed ventricular-paced complexes No further analysis attempted due to paced rhythm Confirmed by Orpah Greek [phone]) on 08/10/2017 12:42:26 AM   Radiology No results found.  Procedures Procedures (including critical care time)  Medications Ordered  in ED Medications  patiromer Daryll Drown) packet 16.8 g (has no administration in time range)  sodium chloride 0.9 % bolus 1,000 mL (1,000 mLs Intravenous New Bag/Given 08/09/17 2345)     Initial Impression / Assessment and Plan / ED Course  I have reviewed the triage vital signs and the nursing notes.  Pertinent labs & imaging results that were available during my care of the patient were reviewed by me and considered in my medical decision making (see chart for details).     Patient presents to the emergency department for evaluation of elevated potassium and creatinine.  Patient does have a baseline renal insufficiency.  Reviewing her records reveals that she had normal renal function until July of last year when she suddenly bumped her creatinine to 2.  According to her son, this was blamed on methotrexate.  It appears that her baseline is currently around 1.7-1.8.  Creatinine today, however, is 2.88.  She has a mild elevation of her potassium as well, no EKG changes.  Patient has not had any active vomiting or fluid loss.  She has a mild non-anion gap acidosis.  Patient reports that she is currently being treated for a urinary tract infection.  She had to stop the antibiotic because of what sounds like possible allergic reaction.  She does not have any rash or signs of anaphylaxis currently.  Her urinalysis currently does not seem consistent with infection, culture pending.  I would not add any further antibiotics because I am not sure what she has been recently on.  Because of her acute on chronic renal insufficiency with mild hyperkalemia, will recommend hospitalization with IV  hydration and following her kidney function.  I would recommend monitoring the potassium closely.  She was given a single dose of Veltassa for her hyperkalemia.  Final Clinical Impressions(s) / ED Diagnoses   Final diagnoses:  Hyperkalemia  AKI (acute kidney injury) [facility])    ED Discharge Orders    None        Orpah Greek, MD 08/10/17 [phone]

## 2017-08-09 NOTE — ED Notes (Signed)
Pt has had presurgical labs done at [name]   Was told her labs were abnormal and needed to be rechecked  Here for same

## 2017-08-09 NOTE — ED Triage Notes (Signed)
Per family member, patient had some pre-admission labs today, elevated potassium and creatine.

## 2017-08-10 ENCOUNTER — Other Ambulatory Visit: Payer: Self-pay

## 2017-08-10 ENCOUNTER — Encounter (HOSPITAL_COMMUNITY): Payer: Self-pay | Admitting: Family Medicine

## 2017-08-10 DIAGNOSIS — D638 Anemia in other chronic diseases classified elsewhere: Secondary | ICD-10-CM | POA: Diagnosis not present

## 2017-08-10 DIAGNOSIS — N184 Chronic kidney disease, stage 4 (severe): Secondary | ICD-10-CM | POA: Diagnosis not present

## 2017-08-10 DIAGNOSIS — I5042 Chronic combined systolic (congestive) and diastolic (congestive) heart failure: Secondary | ICD-10-CM

## 2017-08-10 DIAGNOSIS — N179 Acute kidney failure, unspecified: Secondary | ICD-10-CM | POA: Diagnosis not present

## 2017-08-10 DIAGNOSIS — E875 Hyperkalemia: Secondary | ICD-10-CM | POA: Diagnosis not present

## 2017-08-10 DIAGNOSIS — M0579 Rheumatoid arthritis with rheumatoid factor of multiple sites without organ or systems involvement: Secondary | ICD-10-CM | POA: Diagnosis not present

## 2017-08-10 LAB — BASIC METABOLIC PANEL
Anion gap: 12 (ref 5–15)
BUN: 61 mg/dL — AB (ref 6–20)
CO2: 17 mmol/L — ABNORMAL LOW (ref 22–32)
Calcium: 8.7 mg/dL — ABNORMAL LOW (ref 8.9–10.3)
Chloride: 113 mmol/L — ABNORMAL HIGH (ref 101–111)
Creatinine, Ser: 2.19 mg/dL — ABNORMAL HIGH (ref 0.44–1.00)
GFR calc Af Amer: 24 mL/min — ABNORMAL LOW (ref >60)
GFR calc non Af Amer: 20 mL/min — ABNORMAL LOW (ref >60)
Glucose, Bld: 80 mg/dL (ref 65–99)
POTASSIUM: 4.5 mmol/L (ref 3.5–5.1)
SODIUM: 142 mmol/L (ref 135–145)

## 2017-08-10 LAB — GLUCOSE, CAPILLARY: GLUCOSE-CAPILLARY: 81 mg/dL (ref 65–99)

## 2017-08-10 MED ORDER — SODIUM POLYSTYRENE SULFONATE 15 GM/60ML PO SUSP
30.0000 g | Freq: Once | ORAL | Status: AC
  Administered 2017-08-10: 30 g via ORAL
  Filled 2017-08-10: qty 120

## 2017-08-10 MED ORDER — ALLOPURINOL 100 MG PO TABS
100.0000 mg | ORAL_TABLET | Freq: Every day | ORAL | Status: DC
  Administered 2017-08-10: 100 mg via ORAL
  Filled 2017-08-10: qty 1

## 2017-08-10 MED ORDER — COLCHICINE 0.6 MG PO TABS: 0.6000 mg | ORAL_TABLET | ORAL | Status: DC

## 2017-08-10 MED ORDER — ONDANSETRON HCL 4 MG/2ML IJ SOLN: 4.0000 mg | Freq: Four times a day (QID) | INTRAMUSCULAR | Status: DC | PRN

## 2017-08-10 MED ORDER — PATIROMER SORBITEX CALCIUM 8.4 G PO PACK
16.8000 g | PACK | Freq: Once | ORAL | Status: DC
  Filled 2017-08-10: qty 4

## 2017-08-10 MED ORDER — PANTOPRAZOLE SODIUM 20 MG PO TBEC
20.0000 mg | DELAYED_RELEASE_TABLET | Freq: Every day | ORAL | Status: DC
  Filled 2017-08-10 (×3): qty 1

## 2017-08-10 MED ORDER — ACETAMINOPHEN 325 MG PO TABS
650.0000 mg | ORAL_TABLET | Freq: Four times a day (QID) | ORAL | Status: DC | PRN
  Administered 2017-08-10: 650 mg via ORAL
  Filled 2017-08-10: qty 2

## 2017-08-10 MED ORDER — PANTOPRAZOLE SODIUM 40 MG PO TBEC
40.0000 mg | DELAYED_RELEASE_TABLET | Freq: Every day | ORAL | Status: DC
  Administered 2017-08-10: 40 mg via ORAL
  Filled 2017-08-10: qty 1

## 2017-08-10 MED ORDER — COLCHICINE 0.6 MG PO TABS
0.3000 mg | ORAL_TABLET | Freq: Every day | ORAL | Status: DC
  Filled 2017-08-10: qty 0.5
  Filled 2017-08-10: qty 1
  Filled 2017-08-10 (×2): qty 0.5

## 2017-08-10 MED ORDER — HYDROXYCHLOROQUINE SULFATE 200 MG PO TABS: 200.0000 mg | ORAL_TABLET | ORAL | Status: DC

## 2017-08-10 MED ORDER — SODIUM CHLORIDE 0.9% FLUSH
3.0000 mL | Freq: Two times a day (BID) | INTRAVENOUS | Status: DC
  Administered 2017-08-10 (×2): 3 mL via INTRAVENOUS

## 2017-08-10 MED ORDER — PRAVASTATIN SODIUM 40 MG PO TABS: 40.0000 mg | ORAL_TABLET | Freq: Every day | ORAL | Status: DC

## 2017-08-10 MED ORDER — HEPARIN SODIUM (PORCINE) 5000 UNIT/ML IJ SOLN: 5000.0000 [IU] | Freq: Three times a day (TID) | INTRAMUSCULAR | Status: DC

## 2017-08-10 MED ORDER — HYDROCODONE-ACETAMINOPHEN 5-325 MG PO TABS: 1.0000 | ORAL_TABLET | ORAL | Status: DC | PRN

## 2017-08-10 MED ORDER — ONDANSETRON HCL 4 MG PO TABS: 4.0000 mg | ORAL_TABLET | Freq: Four times a day (QID) | ORAL | Status: DC | PRN

## 2017-08-10 MED ORDER — CARVEDILOL 12.5 MG PO TABS
6.2500 mg | ORAL_TABLET | Freq: Two times a day (BID) | ORAL | Status: DC
  Administered 2017-08-10: 6.25 mg via ORAL
  Filled 2017-08-10: qty 1

## 2017-08-10 MED ORDER — CHLORHEXIDINE GLUCONATE CLOTH 2 % EX PADS
6.0000 | MEDICATED_PAD | Freq: Every day | CUTANEOUS | Status: DC
  Administered 2017-08-10: 6 via TOPICAL

## 2017-08-10 MED ORDER — DIPHENHYDRAMINE HCL 25 MG PO CAPS: 25.0000 mg | ORAL_CAPSULE | Freq: Two times a day (BID) | ORAL | Status: DC | PRN

## 2017-08-10 MED ORDER — ACETAMINOPHEN 650 MG RE SUPP: 650.0000 mg | Freq: Four times a day (QID) | RECTAL | Status: DC | PRN

## 2017-08-10 MED ORDER — SODIUM CHLORIDE 0.9 % IV SOLN
INTRAVENOUS | Status: AC
  Administered 2017-08-10: 02:00:00 via INTRAVENOUS

## 2017-08-10 MED ORDER — MUPIROCIN 2 % EX OINT
1.0000 "application " | TOPICAL_OINTMENT | Freq: Two times a day (BID) | CUTANEOUS | Status: DC
  Administered 2017-08-10 (×2): 1 via NASAL
  Filled 2017-08-10: qty 22

## 2017-08-10 NOTE — Progress Notes (Signed)
Discharge instructions gone over with patient, verbalized understanding. IV removed via NT, patient tolerated procedure well.

## 2017-08-10 NOTE — H&P (Signed)
History and Physical    [name] QBH:[id number] DOB: 1941/04/01 DOA: 08/09/2017  PCP: [name], MD   Patient coming from: Home  Chief Complaint: Lab abnormalities   HPI: [name] is a 77 y.o. female with medical history significant for chronic kidney disease stage IV, anemia of chronic disease, chronic combined systolic and diastolic CHF, and rheumatoid arthritis, now presenting to the emergency department for evaluation of lab abnormalities.  The patient has ruptured extensor tendons in the right wrist, is scheduled for orthopedic surgery, and had labs drawn today for preoperative evaluation.  She was called, notified that her potassium was high, and directed to the ED.  She reports a general malaise for several days, but no specific symptoms.  She denies chest pain, cough, shortness of breath, leg swelling or tenderness, melena or hematochezia, vomiting, or diarrhea.  ED Course: Upon arrival to the ED, patient is found to be afebrile, saturating well on room air, and with vitals stable.  EKG features a paced rhythm and chemistry panel is notable for potassium of 5.4, bicarbonate 18, and creatinine of 2.88, up from 1.81 in February.  CBC features a chronic stable normocytic anemia with hemoglobin of 9.6.  Patient was given 1 L of normal saline and Veltassa in the ED.  She remains hemodynamically stable, in no apparent respiratory distress, and will be observed on the telemetry unit for ongoing evaluation and management of acute kidney injury superimposed on chronic kidney disease stage IV with hyperkalemia.  Review of Systems:  All other systems reviewed and apart from HPI, are negative.  Past Medical History:  Diagnosis Date  . Anemia    Hgb of 9-10  . Anemia of chronic disease    Hgb of 9-10 chronically; 06/2010: H&H-10.7/33.5, MCV-81, normal iron studies in 2010   . Arteriosclerotic cardiovascular disease (ASCVD)    Remote PTCA by patient report; LBBB; associated  cardiomyopathy, presumed ischemic with EF 40-45% previously, 20% in 06/2009; h/o clinical congestive heart failure; negative stress nuclear in 2009 with inferoseptal and apical scar  . Automatic implantable cardioverter-defibrillator in situ   . Breast cancer ([place])    breast  . Chronic bronchitis ([place])   . Chronic renal disease, stage 3, moderately decreased glomerular filtration rate (GFR) between 30-59 mL/min/1.73 square meter (HCC) 02/01/2016  . Congestive heart failure (CHF) ([place])   . Elevated cholesterol   . Elevated sed rate   . GERD (gastroesophageal reflux disease)   . GI bleed   . Gout   . HOH (hard of hearing)   . Hyperlipidemia   . Hypertension   . Rheumatoid arthritis ([place])   . UTI (urinary tract infection)     Past Surgical History:  Procedure Laterality Date  . BI-VENTRICULAR IMPLANTABLE CARDIOVERTER DEFIBRILLATOR N/A 07/28/2012   Procedure: BI-VENTRICULAR IMPLANTABLE CARDIOVERTER DEFIBRILLATOR  (CRT-D);  Surgeon: Evans Lance, MD;  Location: [facility] CATH LAB;  Service: Cardiovascular;  Laterality: N/A;  . BI-VENTRICULAR IMPLANTABLE CARDIOVERTER DEFIBRILLATOR  (CRT-D)  07/28/2012  . BREAST BIOPSY Bilateral   . CATARACT EXTRACTION W/ INTRAOCULAR LENS IMPLANT Left   . CATARACT EXTRACTION W/PHACO Right 09/15/2013   Procedure: CATARACT EXTRACTION PHACO AND INTRAOCULAR LENS PLACEMENT (IOC);  Surgeon: Elta Guadeloupe T. Gershon Crane, MD;  Location: [facility] ORS;  Service: Ophthalmology;  Laterality: Right;  CDE:  10.74  . COLONOSCOPY N/A 03/04/2015   Procedure: COLONOSCOPY;  Surgeon: Rogene Houston, MD;  Location: [facility] ENDO SUITE;  Service: Endoscopy;  Laterality: N/A;  10:50   . ESOPHAGOGASTRODUODENOSCOPY N/A 03/04/2015  Procedure: ESOPHAGOGASTRODUODENOSCOPY (EGD);  Surgeon: Rogene Houston, MD;  Location: [facility] END[facility] SU[facility];  Service: Endoscopy;  Laterality: N/A;  . ESOPHAGOGASTRODUODENOSCOPY N/A 09/21/2016   Procedure: ESOPHAGOGASTRODUODENOSCOPY (EGD);  Surgeon: Rogene Houston, MD;  Location: [facility] END[facility];   Service: Endoscopy;  Laterality: N/A;  . GIVENS CAPSULE STUDY N/A 09/22/2016   Procedure: GIVENS CAPSULE STUDY;  Surgeon: Rogene Houston, MD;  Location: [facility] END[facility];  Service: Endoscopy;  Laterality: N/A;  . MASTECTOMY Left 1998  . TOTAL KNEE ARTHROPLASTY Right    Dr.Harrison  . TUBAL LIGATION       reports that she quit smoking about 10 years ago. Her smoking use included cigarettes. She started smoking about 60 years ago. She has a 7.50 pack-year smoking history. Her smokeless tobacco use includes chew. She reports that she does not drink alcohol or use drugs.  Allergies  Allergen Reactions  . Macrobid [Nitrofurantoin Macrocrystal] Hives and Itching    Family History  Problem Relation Age of Onset  . Diabetes Father   . Pancreatic cancer Father   . Hypertension Brother      Prior to Admission medications   Medication Sig Start Date End Date Taking? Authorizing Provider  acetaminophen (TYLENOL) 500 MG tablet Take 500 mg by mouth every 6 (six) hours as needed (FOR PAIN/HEADACHES).   Yes [provider]  allopurinol (ZYLOPRIM) 100 MG tablet TAKE 1 TABLET (100 MG TOTAL) BY MOUTH DAILY. 12/11/16 08/07/18 Yes Deveshwar, Abel Presto, MD  carvedilol (COREG) 6.25 MG tablet TAKE 1 TABLET BY MOUTH TWICE A DAY WITH MEALS 05/16/17  Yes Herminio Commons, MD  colchicine (COLCRYS) 0.6 MG tablet Take 0.6 mg by mouth daily.   Yes [provider]  diphenhydrAMINE (BENADRYL) 25 mg capsule Take 25 mg by mouth 2 (two) times daily as needed for itching or allergies.    Yes [provider]  furosemide (LASIX) 40 MG tablet Take 40 mg am and take 20 mg (1/2 tablet) in pm Patient taking differently: Take 40 mg by mouth daily.  10/15/16  Yes Lendon Colonel, NP  hydroxychloroquine (PLAQUENIL) 200 MG tablet TAKE 1 TABLET (200 MG TOTAL) BY MOUTH DAILY. [place] Patient taking differently: Take 200 mg by mouth See admin instructions. TAKE 1 TABLET (200 MG TOTAL) BY MOUTH  DAILY. MONDAY THRU FRIDAY 05/27/17  Yes Ofilia Neas, PA-C  lisinopril (PRINIVIL,ZESTRIL) 20 MG tablet Take 20 mg by mouth daily.  04/27/16  Yes [provider]  lovastatin (MEVACOR) 40 MG tablet TAKE 1 TABLET (40 MG TOTAL) BY MOUTH AT BEDTIME. 05/16/17  Yes Herminio Commons, MD  Magnesium 500 MG CAPS Take 500 mg by mouth at bedtime.   Yes [provider]  pantoprazole (PROTONIX) 20 MG tablet Take 1 tablet (20 mg total) by mouth daily. 11/20/16  Yes Lendon Colonel, NP  potassium chloride SA (K-DUR,KLOR-CON) 20 MEQ tablet Take 2 tablets (40 mEq total) by mouth daily. Patient taking differently: Take 20 mEq by mouth 2 (two) times daily.  10/15/16  Yes Lendon Colonel, NP    Physical Exam: Vitals:   08/09/17 2106 08/09/17 2107 08/09/17 2339  BP:  (!) 114/50 (!) 123/46  Pulse:  87 81  Resp:   16  Temp:  98.8 F (37.1 C)   TempSrc:  Oral   SpO2:  95% 98%  Weight: 64.9 kg (143 lb)    Height: 5\' 4"  (1.626 m)        Constitutional: NAD, calm  Eyes:  PERTLA, lids and conjunctivae normal ENMT: Mucous membranes are moist. Posterior pharynx clear of any exudate or lesions.   Neck: normal, supple, no masses, no thyromegaly Respiratory: clear to auscultation bilaterally, no wheezing, no crackles. Normal respiratory effort.   Cardiovascular: S1 & S2 heard, regular rate and rhythm. No extremity edema.  Abdomen: No distension, no tenderness, soft. Bowel sounds normal.  Musculoskeletal: no clubbing / cyanosis. Impaired extension of digits 3-5 on right hand.   Skin: no significant rashes, lesions, ulcers. Warm, dry, well-perfused. Neurologic: No facial asymmetry. Gross hearing deficit. Sensation intact. Moving all extremities.  Psychiatric:  Alert and oriented x 3. Pleasant and cooperative.     Labs on Admission: I have personally reviewed following labs and imaging studies  CBC: Recent Labs  Lab 08/09/17 1506 08/09/17 2209  WBC 7.7 7.9  NEUTROABS  --  5.3  HGB  9.2* 9.6*  HCT 29.3* 30.8*  MCV 88.0 88.5  PLT 204 948   Basic Metabolic Panel: Recent Labs  Lab 08/09/17 1506 08/09/17 2209  NA 135 139  K 6.1* 5.4*  CL 108 109  CO2 16* 18*  GLUCOSE 84 103*  BUN 65* 71*  CREATININE 2.73* 2.88*  CALCIUM 9.0 9.0   GFR: Estimated Creatinine Clearance: 13.9 mL/min (A) (by C-G formula based on SCr of 2.88 mg/dL (H)). Liver Function Tests: No results for input(s): AST, ALT, ALKPHOS, BILITOT, PROT, ALBUMIN in the last 168 hours. No results for input(s): LIPASE, AMYLASE in the last 168 hours. No results for input(s): AMMONIA in the last 168 hours. Coagulation Profile: No results for input(s): INR, PROTIME in the last 168 hours. Cardiac Enzymes: No results for input(s): CKTOTAL, CKMB, CKMBINDEX, TROPONINI in the last 168 hours. BNP (last 3 results) No results for input(s): PROBNP in the last 8760 hours. HbA1C: No results for input(s): HGBA1C in the last 72 hours. CBG: No results for input(s): GLUCAP in the last 168 hours. Lipid Profile: No results for input(s): CHOL, HDL, LDLCALC, TRIG, CHOLHDL, LDLDIRECT in the last 72 hours. Thyroid Function Tests: No results for input(s): TSH, T4TOTAL, FREET4, T3FREE, THYROIDAB in the last 72 hours. Anemia Panel: No results for input(s): VITAMINB12, FOLATE, FERRITIN, TIBC, IRON, RETICCTPCT in the last 72 hours. Urine analysis:    Component Value Date/Time   COLORURINE YELLOW 08/09/2017 2337   APPEARANCEUR CLEAR 08/09/2017 2337   LABSPEC 1.011 08/09/2017 2337   PHURINE 5.0 08/09/2017 2337   GLUCOSEU NEGATIVE 08/09/2017 2337   HGBUR NEGATIVE 08/09/2017 2337   BILIRUBINUR NEGATIVE 08/09/2017 2337   KETONESUR NEGATIVE 08/09/2017 2337   PROTEINUR NEGATIVE 08/09/2017 2337   UROBILINOGEN 0.2 07/17/2013 2240   NITRITE NEGATIVE 08/09/2017 2337   LEUKOCYTESUR TRACE (A) 08/09/2017 2337   Sepsis Labs: @LABRCNTIP (procalcitonin:4,lacticidven:4) ) Recent Results (from the past 240 hour(s))  Surgical pcr  screen     Status: Abnormal   Collection Time: 08/09/17  3:06 PM  Result Value Ref Range Status   MRSA, PCR POSITIVE (A) NEGATIVE Final    Comment: RESULT CALLED TO, READ BACK BY AND VERIFIED WITH: Charlott Holler RN 17:30 08/09/17 ([email or web address])    Staphylococcus aureus POSITIVE (A) NEGATIVE Final    Comment: (NOTE) The Xpert SA Assay (FDA approved for NASAL specimens in patients 79 years of age and older), is one component of a comprehensive surveillance program. It is not intended to diagnose infection nor to guide or monitor treatment. Performed at [facility], [place] [address], [place], [place] [address]      Radiological Exams  on Admission: No results found.  EKG: Independently reviewed. Paced rhythm.   Assessment/Plan   1. Hyperkalemia  - Had basic labs drawn for preoperative evaluation and sent to ED when they came back demonstrating AKI on CKD and hyperkalemia with potassium 6.1  - Treated in ED with 1 liter NS and patiromer  - Continue cardiac monitoring, continue gentle IVF hydration, hold lisinopril, stop potassium supplement, repeat chemistry panel in am    2. Acute kidney injury superimposed on CKD stage IV  - SCr is 2.88 on admission, up from 1.8 in February 2019  - Appears hypovolemic on admission and this may be a prerenal azotemia  - Check urine chemistries, renally-dose medications, continue gentle IVF hydration, repeat chem panel in am    3. Chronic combined systolic/diastolic CHF  - Appears hypovolemic on arrival and was given 1 liter NS in ED  - Hold Lasix and continue a gentle IVF hydration overnight  - Follow daily wt and I/O's, hold lisinopril in light of AKI and hyperkalemia   4. Rheumatoid arthritis  - Stable, follows with rheumatology, will continue Plaquenil   5. Anemia  - Attributed to CKD and iron-deficiency  - Follows with hematology for Feraheme  - Counts are stable on admission with no bleeding    DVT prophylaxis: sq heparin  Code Status: Full   Family Communication: Nephew updated at bedside Consults called: None Admission status: Observation    Vianne Bulls, MD [facility] Pager [phone]  If 7PM-7AM, please contact night-coverage [email or web address] Password [email or web address]  08/10/2017, 1:12 AM

## 2017-08-10 NOTE — Discharge Summary (Signed)
Physician Discharge Summary  [name] CBU:[id number] DOB: 07-Sep-1940 DOA: 08/09/2017  PCP: [name], MD  Admit date: 08/09/2017 Discharge date: 08/10/2017  Time spent: 45 minutes  Recommendations for Outpatient Follow-up:  -Will be discharged home today. -Advised to follow up with PCP in 2 weeks.   Discharge Diagnoses:  Principal Problem:   Hyperkalemia Active Problems:   Anemia of chronic disease   Biventricular ICD in place (MDT 2014)   Rheumatoid arthritis ([place])   CAD S/P remote PCI- no details   Chronic combined systolic and diastolic CHF (congestive heart failure) (HCC)   CKD (chronic kidney disease), stage IV (HCC)   AKI (acute kidney injury) ([place])   Discharge Condition: Stable and improved  Filed Weights   08/09/17 2106 08/10/17 0500  Weight: 64.9 kg (143 lb) 64.8 kg (142 lb 13.7 oz)    History of present illness:  As per Dr. Myna Hidalgo on 4/20: [name] is a 77 y.o. female with medical history significant for chronic kidney disease stage IV, anemia of chronic disease, chronic combined systolic and diastolic CHF, and rheumatoid arthritis, now presenting to the emergency department for evaluation of lab abnormalities.  The patient has ruptured extensor tendons in the right wrist, is scheduled for orthopedic surgery, and had labs drawn today for preoperative evaluation.  She was called, notified that her potassium was high, and directed to the ED.  She reports a general malaise for several days, but no specific symptoms.  She denies chest pain, cough, shortness of breath, leg swelling or tenderness, melena or hematochezia, vomiting, or diarrhea.  ED Course: Upon arrival to the ED, patient is found to be afebrile, saturating well on room air, and with vitals stable.  EKG features a paced rhythm and chemistry panel is notable for potassium of 5.4, bicarbonate 18, and creatinine of 2.88, up from 1.81 in February.  CBC features a chronic stable normocytic anemia  with hemoglobin of 9.6.  Patient was given 1 L of normal saline and Veltassa in the ED.  She remains hemodynamically stable, in no apparent respiratory distress, and will be observed on the telemetry unit for ongoing evaluation and management of acute kidney injury superimposed on chronic kidney disease stage IV with hyperkalemia.    Hospital Course:   Hyperkalemia -Given a potassium binder and fluids. -Potassium has normalized and on discharge is 4.5.  Acute on chronic kidney disease stage IV -Baseline creatinine is around 1.8, creatinine was 2.8 on admission down to 2.1 on discharge. -Have decided to hold Lasix and lisinopril at this time -Will need close follow-up with PCP for monitoring of renal function and blood pressure.  Chronic combined heart failure -Appears actually hypovolemic on exam and was given a liter of IV fluids. -We have decided to hold Lasix and lisinopril in the face of acute renal failure and hyperkalemia.  Rheumatoid arthritis -Continue Plaquenil.   Procedures:  None   Consultations:  None  Discharge Instructions  Discharge Instructions    Diet - low sodium heart healthy   Complete by:  As directed    Increase activity slowly   Complete by:  As directed      Allergies as of 08/10/2017      Reactions   Macrobid [nitrofurantoin Macrocrystal] Hives, Itching      Medication List    STOP taking these medications   furosemide 40 MG tablet Commonly known as:  LASIX   lisinopril 20 MG tablet Commonly known as:  PRINIVIL,ZESTRIL   potassium  chloride SA 20 MEQ tablet Commonly known as:  K-DUR,KLOR-CON     TAKE these medications   acetaminophen 500 MG tablet Commonly known as:  TYLENOL Take 500 mg by mouth every 6 (six) hours as needed (FOR PAIN/HEADACHES).   allopurinol 100 MG tablet Commonly known as:  ZYLOPRIM TAKE 1 TABLET (100 MG TOTAL) BY MOUTH DAILY.   carvedilol 6.25 MG tablet Commonly known as:  COREG TAKE 1 TABLET BY MOUTH  TWICE A DAY WITH MEALS   COLCRYS 0.6 MG tablet Generic drug:  colchicine Take 0.6 mg by mouth daily.   diphenhydrAMINE 25 mg capsule Commonly known as:  BENADRYL Take 25 mg by mouth 2 (two) times daily as needed for itching or allergies.   hydroxychloroquine 200 MG tablet Commonly known as:  PLAQUENIL TAKE 1 TABLET (200 MG TOTAL) BY MOUTH DAILY. MONDAY THRU FRIDAY What changed:    how much to take  how to take this  when to take this  additional instructions   lovastatin 40 MG tablet Commonly known as:  MEVACOR TAKE 1 TABLET (40 MG TOTAL) BY MOUTH AT BEDTIME.   Magnesium 500 MG Caps Take 500 mg by mouth at bedtime.   pantoprazole 20 MG tablet Commonly known as:  PROTONIX Take 1 tablet (20 mg total) by mouth daily.      Allergies  Allergen Reactions  . Macrobid [Nitrofurantoin Macrocrystal] Hives and Itching   Follow-up Information    [name], MD. Schedule an appointment as soon as possible for a visit in 2 week(s).   Specialty:  Internal Medicine Contact information: [place] [address] [phone]            The results of significant diagnostics from this hospitalization (including imaging, microbiology, ancillary and laboratory) are listed below for reference.    Significant Diagnostic Studies: No results found.  Microbiology: Recent Results (from the past 240 hour(s))  Surgical pcr screen     Status: Abnormal   Collection Time: 08/09/17  3:06 PM  Result Value Ref Range Status   MRSA, PCR POSITIVE (A) NEGATIVE Final    Comment: RESULT CALLED TO, READ BACK BY AND VERIFIED WITH: Charlott Holler RN 17:30 08/09/17 ([email or web address])    Staphylococcus aureus POSITIVE (A) NEGATIVE Final    Comment: (NOTE) The Xpert SA Assay (FDA approved for NASAL specimens in patients 65 years of age and older), is one component of a comprehensive surveillance program. It is not intended to diagnose infection nor to guide or monitor  treatment. Performed at [facility], [place] [address] [place], [place] [address]      Labs: Basic Metabolic Panel: Recent Labs  Lab 08/09/17 1506 08/09/17 2209 08/10/17 0645  NA 135 139 142  K 6.1* 5.4* 4.5  CL 108 109 113*  CO2 16* 18* 17*  GLUCOSE 84 103* 80  BUN 65* 71* 61*  CREATININE 2.73* 2.88* 2.19*  CALCIUM 9.0 9.0 8.7*   Liver Function Tests: No results for input(s): AST, ALT, ALKPHOS, BILITOT, PROT, ALBUMIN in the last 168 hours. No results for input(s): LIPASE, AMYLASE in the last 168 hours. No results for input(s): AMMONIA in the last 168 hours. CBC: Recent Labs  Lab 08/09/17 1506 08/09/17 2209  WBC 7.7 7.9  NEUTROABS  --  5.3  HGB 9.2* 9.6*  HCT 29.3* 30.8*  MCV 88.0 88.5  PLT 204 189   Cardiac Enzymes: No results for input(s): CKTOTAL, CKMB, CKMBINDEX, TROPONINI in the last 168 hours. BNP: BNP (last  3 results) No results for input(s): BNP in the last 8760 hours.  ProBNP (last 3 results) No results for input(s): PROBNP in the last 8760 hours.  CBG: Recent Labs  Lab 08/10/17 0806  GLUCAP 81       Signed:  [facility] Pager: [phone] 08/10/2017, 4:56 PM

## 2017-08-11 LAB — URINE CULTURE: Culture: NO GROWTH

## 2017-08-13 DIAGNOSIS — E785 Hyperlipidemia, unspecified: Secondary | ICD-10-CM | POA: Diagnosis not present

## 2017-08-13 DIAGNOSIS — I5022 Chronic systolic (congestive) heart failure: Secondary | ICD-10-CM | POA: Diagnosis not present

## 2017-08-14 DIAGNOSIS — Z79899 Other long term (current) drug therapy: Secondary | ICD-10-CM | POA: Diagnosis not present

## 2017-08-14 DIAGNOSIS — H43813 Vitreous degeneration, bilateral: Secondary | ICD-10-CM | POA: Diagnosis not present

## 2017-08-14 DIAGNOSIS — H35373 Puckering of macula, bilateral: Secondary | ICD-10-CM | POA: Diagnosis not present

## 2017-08-14 DIAGNOSIS — H35363 Drusen (degenerative) of macula, bilateral: Secondary | ICD-10-CM | POA: Diagnosis not present

## 2017-08-15 ENCOUNTER — Ambulatory Visit (HOSPITAL_COMMUNITY)
Admission: RE | Admit: 2017-08-15 | Discharge: 2017-08-15 | Disposition: A | Discharge status: Home / Self Care | Payer: Medicare HMO | Source: Ambulatory Visit | Attending: Orthopedic Surgery | Admitting: Orthopedic Surgery

## 2017-08-15 ENCOUNTER — Encounter (HOSPITAL_COMMUNITY): Payer: Self-pay

## 2017-08-15 ENCOUNTER — Encounter (HOSPITAL_COMMUNITY)
Admission: RE | Disposition: A | Discharge status: Home / Self Care | Payer: Self-pay | Source: Ambulatory Visit | Attending: Orthopedic Surgery

## 2017-08-15 ENCOUNTER — Other Ambulatory Visit: Payer: Self-pay | Admitting: Physician Assistant

## 2017-08-15 ENCOUNTER — Ambulatory Visit (HOSPITAL_COMMUNITY): Payer: Medicare HMO | Admitting: Emergency Medicine

## 2017-08-15 ENCOUNTER — Ambulatory Visit (HOSPITAL_COMMUNITY): Payer: Medicare HMO | Admitting: Vascular Surgery

## 2017-08-15 DIAGNOSIS — M069 Rheumatoid arthritis, unspecified: Secondary | ICD-10-CM | POA: Insufficient documentation

## 2017-08-15 DIAGNOSIS — S66312A Strain of extensor muscle, fascia and tendon of right middle finger at wrist and hand level, initial encounter: Secondary | ICD-10-CM | POA: Diagnosis not present

## 2017-08-15 DIAGNOSIS — M0579 Rheumatoid arthritis with rheumatoid factor of multiple sites without organ or systems involvement: Secondary | ICD-10-CM

## 2017-08-15 DIAGNOSIS — M25531 Pain in right wrist: Secondary | ICD-10-CM | POA: Diagnosis present

## 2017-08-15 DIAGNOSIS — I509 Heart failure, unspecified: Secondary | ICD-10-CM | POA: Diagnosis not present

## 2017-08-15 DIAGNOSIS — I130 Hypertensive heart and chronic kidney disease with heart failure and stage 1 through stage 4 chronic kidney disease, or unspecified chronic kidney disease: Secondary | ICD-10-CM | POA: Insufficient documentation

## 2017-08-15 DIAGNOSIS — S66306A Unspecified injury of extensor muscle, fascia and tendon of right little finger at wrist and hand level, initial encounter: Secondary | ICD-10-CM | POA: Diagnosis not present

## 2017-08-15 DIAGNOSIS — S66314A Strain of extensor muscle, fascia and tendon of right ring finger at wrist and hand level, initial encounter: Secondary | ICD-10-CM | POA: Diagnosis not present

## 2017-08-15 DIAGNOSIS — N183 Chronic kidney disease, stage 3 (moderate): Secondary | ICD-10-CM | POA: Diagnosis not present

## 2017-08-15 DIAGNOSIS — E785 Hyperlipidemia, unspecified: Secondary | ICD-10-CM | POA: Insufficient documentation

## 2017-08-15 DIAGNOSIS — K219 Gastro-esophageal reflux disease without esophagitis: Secondary | ICD-10-CM | POA: Diagnosis not present

## 2017-08-15 DIAGNOSIS — E7800 Pure hypercholesterolemia, unspecified: Secondary | ICD-10-CM | POA: Diagnosis not present

## 2017-08-15 DIAGNOSIS — S66316A Strain of extensor muscle, fascia and tendon of right little finger at wrist and hand level, initial encounter: Secondary | ICD-10-CM | POA: Diagnosis not present

## 2017-08-15 DIAGNOSIS — Z79899 Other long term (current) drug therapy: Secondary | ICD-10-CM | POA: Diagnosis not present

## 2017-08-15 DIAGNOSIS — I2510 Atherosclerotic heart disease of native coronary artery without angina pectoris: Secondary | ICD-10-CM | POA: Insufficient documentation

## 2017-08-15 DIAGNOSIS — X58XXXA Exposure to other specified factors, initial encounter: Secondary | ICD-10-CM | POA: Insufficient documentation

## 2017-08-15 DIAGNOSIS — Z95810 Presence of automatic (implantable) cardiac defibrillator: Secondary | ICD-10-CM | POA: Insufficient documentation

## 2017-08-15 DIAGNOSIS — S66302A Unspecified injury of extensor muscle, fascia and tendon of right middle finger at wrist and hand level, initial encounter: Secondary | ICD-10-CM | POA: Diagnosis not present

## 2017-08-15 DIAGNOSIS — M659 Synovitis and tenosynovitis, unspecified: Secondary | ICD-10-CM | POA: Insufficient documentation

## 2017-08-15 DIAGNOSIS — I110 Hypertensive heart disease with heart failure: Secondary | ICD-10-CM | POA: Diagnosis not present

## 2017-08-15 DIAGNOSIS — Z853 Personal history of malignant neoplasm of breast: Secondary | ICD-10-CM | POA: Diagnosis not present

## 2017-08-15 DIAGNOSIS — Z87891 Personal history of nicotine dependence: Secondary | ICD-10-CM | POA: Diagnosis not present

## 2017-08-15 DIAGNOSIS — M109 Gout, unspecified: Secondary | ICD-10-CM | POA: Diagnosis not present

## 2017-08-15 DIAGNOSIS — Z96651 Presence of right artificial knee joint: Secondary | ICD-10-CM | POA: Diagnosis not present

## 2017-08-15 HISTORY — PX: TENDON TRANSFER: SHX6109

## 2017-08-15 LAB — POCT I-STAT 4, (NA,K, GLUC, HGB,HCT)
Glucose, Bld: 73 mg/dL (ref 65–99)
HCT: 24 % — ABNORMAL LOW (ref 36.0–46.0)
Hemoglobin: 8.2 g/dL — ABNORMAL LOW (ref 12.0–15.0)
Potassium: 3.6 mmol/L (ref 3.5–5.1)
Sodium: 142 mmol/L (ref 135–145)

## 2017-08-15 SURGERY — TRANSFER, TENDON
Anesthesia: General | Site: Arm Lower | Laterality: Right

## 2017-08-15 MED ORDER — OXYCODONE-ACETAMINOPHEN 5-325 MG PO TABS
1.0000 | ORAL_TABLET | ORAL | Status: AC
  Administered 2017-08-15: 1 via ORAL

## 2017-08-15 MED ORDER — LIDOCAINE 2% (20 MG/ML) 5 ML SYRINGE
INTRAMUSCULAR | Status: AC
  Filled 2017-08-15: qty 5

## 2017-08-15 MED ORDER — PROPOFOL 10 MG/ML IV BOLUS
INTRAVENOUS | Status: AC
  Filled 2017-08-15: qty 20

## 2017-08-15 MED ORDER — MIDAZOLAM HCL 2 MG/2ML IJ SOLN
INTRAMUSCULAR | Status: AC
  Filled 2017-08-15: qty 2

## 2017-08-15 MED ORDER — HYDROMORPHONE HCL 2 MG/ML IJ SOLN
INTRAMUSCULAR | Status: AC
  Filled 2017-08-15: qty 1

## 2017-08-15 MED ORDER — ONDANSETRON HCL 4 MG/2ML IJ SOLN
INTRAMUSCULAR | Status: DC | PRN
  Administered 2017-08-15: 4 mg via INTRAVENOUS

## 2017-08-15 MED ORDER — 0.9 % SODIUM CHLORIDE (POUR BTL) OPTIME
TOPICAL | Status: DC | PRN
  Administered 2017-08-15: 1000 mL

## 2017-08-15 MED ORDER — PROPOFOL 10 MG/ML IV BOLUS
INTRAVENOUS | Status: DC | PRN
  Administered 2017-08-15: 100 mg via INTRAVENOUS

## 2017-08-15 MED ORDER — LIDOCAINE HCL (CARDIAC) PF 100 MG/5ML IV SOSY
PREFILLED_SYRINGE | INTRAVENOUS | Status: DC | PRN
  Administered 2017-08-15: 40 mg via INTRAVENOUS

## 2017-08-15 MED ORDER — FENTANYL CITRATE (PF) 250 MCG/5ML IJ SOLN
INTRAMUSCULAR | Status: AC
  Filled 2017-08-15: qty 5

## 2017-08-15 MED ORDER — VANCOMYCIN HCL IN DEXTROSE 1-5 GM/200ML-% IV SOLN
INTRAVENOUS | Status: AC
  Administered 2017-08-15: 1000 mg via INTRAVENOUS
  Filled 2017-08-15: qty 200

## 2017-08-15 MED ORDER — CHLORHEXIDINE GLUCONATE 4 % EX LIQD: 60.0000 mL | Freq: Once | CUTANEOUS | Status: DC

## 2017-08-15 MED ORDER — FENTANYL CITRATE (PF) 100 MCG/2ML IJ SOLN
INTRAMUSCULAR | Status: DC | PRN
  Administered 2017-08-15 (×6): 25 ug via INTRAVENOUS

## 2017-08-15 MED ORDER — DEXAMETHASONE SODIUM PHOSPHATE 10 MG/ML IJ SOLN
INTRAMUSCULAR | Status: AC
  Filled 2017-08-15: qty 1

## 2017-08-15 MED ORDER — CEFAZOLIN SODIUM-DEXTROSE 2-4 GM/100ML-% IV SOLN
2.0000 g | INTRAVENOUS | Status: AC
  Administered 2017-08-15: 2 g via INTRAVENOUS
  Filled 2017-08-15: qty 100

## 2017-08-15 MED ORDER — HYDROMORPHONE HCL 2 MG/ML IJ SOLN
0.2500 mg | INTRAMUSCULAR | Status: DC | PRN
  Administered 2017-08-15 (×2): 0.5 mg via INTRAVENOUS

## 2017-08-15 MED ORDER — PROMETHAZINE HCL 25 MG/ML IJ SOLN: 6.2500 mg | INTRAMUSCULAR | Status: DC | PRN

## 2017-08-15 MED ORDER — ONDANSETRON HCL 4 MG/2ML IJ SOLN
INTRAMUSCULAR | Status: AC
  Filled 2017-08-15: qty 2

## 2017-08-15 MED ORDER — OXYCODONE HCL 5 MG PO TABS: 5.0000 mg | ORAL_TABLET | Freq: Once | ORAL | Status: DC | PRN

## 2017-08-15 MED ORDER — OXYCODONE HCL 5 MG/5ML PO SOLN: 5.0000 mg | Freq: Once | ORAL | Status: DC | PRN

## 2017-08-15 MED ORDER — DEXAMETHASONE SODIUM PHOSPHATE 10 MG/ML IJ SOLN
INTRAMUSCULAR | Status: DC | PRN
  Administered 2017-08-15: 4 mg via INTRAVENOUS

## 2017-08-15 MED ORDER — SODIUM CHLORIDE 0.9 % IV SOLN
INTRAVENOUS | Status: DC
  Administered 2017-08-15: 13:00:00 via INTRAVENOUS

## 2017-08-15 MED ORDER — OXYCODONE-ACETAMINOPHEN 5-325 MG PO TABS
ORAL_TABLET | ORAL | Status: AC
  Filled 2017-08-15: qty 1

## 2017-08-15 MED ORDER — VANCOMYCIN HCL IN DEXTROSE 1-5 GM/200ML-% IV SOLN
1000.0000 mg | INTRAVENOUS | Status: AC
  Administered 2017-08-15: 1000 mg via INTRAVENOUS

## 2017-08-15 SURGICAL SUPPLY — 51 items
APL SKNCLS STERI-STRIP NONHPOA (GAUZE/BANDAGES/DRESSINGS) ×1
BANDAGE ACE 3X5.8 VEL STRL LF (GAUZE/BANDAGES/DRESSINGS) ×1 IMPLANT
BANDAGE ACE 4X5 VEL STRL LF (GAUZE/BANDAGES/DRESSINGS) ×1 IMPLANT
BENZOIN TINCTURE PRP APPL 2/3 (GAUZE/BANDAGES/DRESSINGS) ×1 IMPLANT
BLADE LONG MED 31X9 (MISCELLANEOUS) ×1 IMPLANT
BNDG CMPR 9X4 STRL LF SNTH (GAUZE/BANDAGES/DRESSINGS) ×1
BNDG ESMARK 4X9 LF (GAUZE/BANDAGES/DRESSINGS) ×2 IMPLANT
CORDS BIPOLAR (ELECTRODE) ×2 IMPLANT
COVER SURGICAL LIGHT HANDLE (MISCELLANEOUS) ×2 IMPLANT
CUFF TOURNIQUET SINGLE 18IN (TOURNIQUET CUFF) ×1 IMPLANT
DRAPE OEC MINIVIEW 54X84 (DRAPES) ×1 IMPLANT
DRAPE SURG 17X23 STRL (DRAPES) ×2 IMPLANT
DURAPREP 26ML APPLICATOR (WOUND CARE) ×2 IMPLANT
GAUZE SPONGE 4X4 12PLY STRL LF (GAUZE/BANDAGES/DRESSINGS) ×1 IMPLANT
GLOVE BIOGEL M STRL SZ7.5 (GLOVE) ×1 IMPLANT
GLOVE SURG SYN 8.0 (GLOVE) ×4 IMPLANT
GLOVE SURG SYN 8.0 PF PI (GLOVE) ×1 IMPLANT
GOWN STRL REUS W/ TWL LRG LVL3 (GOWN DISPOSABLE) ×1 IMPLANT
GOWN STRL REUS W/ TWL XL LVL3 (GOWN DISPOSABLE) ×1 IMPLANT
GOWN STRL REUS W/TWL LRG LVL3 (GOWN DISPOSABLE) ×4
GOWN STRL REUS W/TWL XL LVL3 (GOWN DISPOSABLE) ×2
KIT BASIN OR (CUSTOM PROCEDURE TRAY) ×2 IMPLANT
KIT TURNOVER KIT B (KITS) ×2 IMPLANT
MANIFOLD NEPTUNE II (INSTRUMENTS) ×2 IMPLANT
NDL HYPO 25GX1X1/2 BEV (NEEDLE) IMPLANT
NEEDLE HYPO 25GX1X1/2 BEV (NEEDLE) IMPLANT
NS IRRIG 1000ML POUR BTL (IV SOLUTION) ×2 IMPLANT
PACK ORTHO EXTREMITY (CUSTOM PROCEDURE TRAY) ×2 IMPLANT
PAD ARMBOARD 7.5X6 YLW CONV (MISCELLANEOUS) ×4 IMPLANT
PAD CAST 3X4 CTTN HI CHSV (CAST SUPPLIES) IMPLANT
PAD CAST 4YDX4 CTTN HI CHSV (CAST SUPPLIES) IMPLANT
PADDING CAST COTTON 3X4 STRL (CAST SUPPLIES) ×2
PADDING CAST COTTON 4X4 STRL (CAST SUPPLIES) ×4
PASSER SUT SWANSON 36MM LOOP (INSTRUMENTS) ×1 IMPLANT
SPLINT PLASTER EXTRA FAST 3X15 (CAST SUPPLIES) ×1
SPLINT PLASTER GYPS XFAST 3X15 (CAST SUPPLIES) IMPLANT
STRIP CLOSURE SKIN 1/2X4 (GAUZE/BANDAGES/DRESSINGS) ×1 IMPLANT
SUT ETHIBOND 3-0 V-5 (SUTURE) IMPLANT
SUT ETHILON 5 0 PS 2 18 (SUTURE) IMPLANT
SUT FIBERWIRE 3-0 18 TAPR NDL (SUTURE) ×10
SUT PROLENE 3 0 PS 2 (SUTURE) ×1 IMPLANT
SUT SILK 4 0 PS 2 (SUTURE) IMPLANT
SUT VIC AB 3-0 FS2 27 (SUTURE) ×1 IMPLANT
SUT VIC AB 4-0 P-3 18X BRD (SUTURE) IMPLANT
SUT VIC AB 4-0 P3 18 (SUTURE)
SUTURE FIBERWR 3-0 18 TAPR NDL (SUTURE) IMPLANT
SYR CONTROL 10ML LL (SYRINGE) IMPLANT
TOWEL OR 17X24 6PK STRL BLUE (TOWEL DISPOSABLE) ×2 IMPLANT
TOWEL OR 17X26 10 PK STRL BLUE (TOWEL DISPOSABLE) ×2 IMPLANT
UNDERPAD 30X30 (UNDERPADS AND DIAPERS) ×2 IMPLANT
WATER STERILE IRR 1000ML POUR (IV SOLUTION) ×2 IMPLANT

## 2017-08-15 NOTE — Telephone Encounter (Signed)
Last Visit: 07/12/17 Next visit: 09/11/17 Labs: 08/09/17 Creat 2.88 GFR 17 previous Creat. 2.73 GFR 18 PLQ Eye Exam: 01/08/17 WNL  Okay to refill PLQ?

## 2017-08-15 NOTE — Transfer of Care (Signed)
Immediate Anesthesia Transfer of Care Note  Patient: [name]  Procedure(s) Performed: TENDON TRANSFER RIGHT WRIST EXTENSORS AS NEEDED, ULNA RESECTION AND STABILIZATION (Right Arm Lower)  Patient Location: PACU  Anesthesia Type:General  Level of Consciousness: awake, alert  and patient cooperative  Airway & Oxygen Therapy: Patient Spontanous Breathing and Patient connected to face mask oxygen  Post-op Assessment: Report given to RN and Post -op Vital signs reviewed and stable  Post vital signs: Reviewed  Last Vitals:  Vitals Value Taken Time  BP 148/76 08/15/2017  3:36 PM  Temp    Pulse 72 08/15/2017  3:39 PM  Resp 18 08/15/2017  3:39 PM  SpO2 95 % 08/15/2017  3:39 PM  Vitals shown include unvalidated device data.  Last Pain:  Vitals:   08/15/17 1222  TempSrc:   PainSc: 0-No pain         Complications: No apparent anesthesia complications

## 2017-08-15 NOTE — Anesthesia Procedure Notes (Addendum)
Procedure Name: LMA Insertion Date/Time: 08/15/2017 1:39 PM Performed by: Jenne Campus, CRNA Pre-anesthesia Checklist: Patient identified, Emergency Drugs available, Suction available and Patient being monitored Patient Re-evaluated:Patient Re-evaluated prior to induction Oxygen Delivery Method: Circle system utilized Preoxygenation: Pre-oxygenation with 100% oxygen Induction Type: IV induction Ventilation: Mask ventilation without difficulty LMA: LMA inserted LMA Size: 4.0 Number of attempts: 1 Placement Confirmation: positive ETCO2,  CO2 detector and breath sounds checked- equal and bilateral Tube secured with: Tape Dental Injury: Teeth and Oropharynx as per pre-operative assessment

## 2017-08-15 NOTE — Anesthesia Preprocedure Evaluation (Addendum)
Anesthesia Evaluation  Patient identified by MRN, date of birth, ID band Patient awake    Reviewed: Allergy & Precautions, H&P , NPO status , Patient's Chart, lab work & pertinent test results, reviewed documented beta blocker date and time   Airway Mallampati: II  TM Distance: >3 FB     Dental  (+) Edentulous Upper, Edentulous Lower   Pulmonary former smoker,    breath sounds clear to auscultation       Cardiovascular hypertension, Pt. on medications and Pt. on home beta blockers + CAD and +CHF  + Cardiac Defibrillator  Rhythm:Regular Rate:Normal     Neuro/Psych    GI/Hepatic GERD  ,  Endo/Other    Renal/GU Renal disease     Musculoskeletal  (+) Arthritis ,   Abdominal   Peds  Hematology  (+) anemia ,   Anesthesia Other Findings   Reproductive/Obstetrics                             Anesthesia Physical  Anesthesia Plan  ASA: III  Anesthesia Plan: General   Post-op Pain Management:    Induction: Intravenous  PONV Risk Score and Plan: 3 and Ondansetron, Dexamethasone and Midazolam  Airway Management Planned: LMA  Additional Equipment:   Intra-op Plan:   Post-operative Plan: Extubation in OR  Informed Consent: I have reviewed the patients History and Physical, chart, labs and discussed the procedure including the risks, benefits and alternatives for the proposed anesthesia with the patient or authorized representative who has indicated his/her understanding and acceptance.   Dental advisory given  Plan Discussed with:   Anesthesia Plan Comments:         Anesthesia Quick Evaluation

## 2017-08-15 NOTE — H&P (Signed)
[name] is an 77 y.o. female.   Chief Complaint: Right hand and wrist pain with loss of active extension of the long, ring, and small fingers right hand HPI: Patient is a very pleasant 77 year old female with rheumatoid arthritis who presents with chronic ruptures of the right long, ring, and small fingers with a prominent distal ulna and dorsal extensor synovitis.  Past Medical History:  Diagnosis Date  . Anemia    Hgb of 9-10  . Anemia of chronic disease    Hgb of 9-10 chronically; 06/2010: H&H-10.7/33.5, MCV-81, normal iron studies in 2010   . Arteriosclerotic cardiovascular disease (ASCVD)    Remote PTCA by patient report; LBBB; associated cardiomyopathy, presumed ischemic with EF 40-45% previously, 20% in 06/2009; h/o clinical congestive heart failure; negative stress nuclear in 2009 with inferoseptal and apical scar  . Automatic implantable cardioverter-defibrillator in situ   . Breast cancer ([place])    breast  . Chronic bronchitis ([place])   . Chronic renal disease, stage 3, moderately decreased glomerular filtration rate (GFR) between 30-59 mL/min/1.73 square meter (HCC) 02/01/2016  . Congestive heart failure (CHF) ([place])   . Elevated cholesterol   . Elevated sed rate   . GERD (gastroesophageal reflux disease)   . GI bleed   . Gout   . HOH (hard of hearing)   . Hyperlipidemia   . Hypertension   . Rheumatoid arthritis ([place])   . UTI (urinary tract infection)     Past Surgical History:  Procedure Laterality Date  . BI-VENTRICULAR IMPLANTABLE CARDIOVERTER DEFIBRILLATOR N/A 07/28/2012   Procedure: BI-VENTRICULAR IMPLANTABLE CARDIOVERTER DEFIBRILLATOR  (CRT-D);  Surgeon: Evans Lance, MD;  Location: [facility] CATH LAB;  Service: Cardiovascular;  Laterality: N/A;  . BI-VENTRICULAR IMPLANTABLE CARDIOVERTER DEFIBRILLATOR  (CRT-D)  07/28/2012  . BREAST BIOPSY Bilateral   . CATARACT EXTRACTION W/ INTRAOCULAR LENS IMPLANT Left   . CATARACT EXTRACTION W/PHACO Right 09/15/2013   Procedure:  CATARACT EXTRACTION PHACO AND INTRAOCULAR LENS PLACEMENT (IOC);  Surgeon: Elta Guadeloupe T. Gershon Crane, MD;  Location: [facility] ORS;  Service: Ophthalmology;  Laterality: Right;  CDE:  10.74  . COLONOSCOPY N/A 03/04/2015   Procedure: COLONOSCOPY;  Surgeon: Rogene Houston, MD;  Location: [facility] ENDO SUITE;  Service: Endoscopy;  Laterality: N/A;  10:50   . ESOPHAGOGASTRODUODENOSCOPY N/A 03/04/2015   Procedure: ESOPHAGOGASTRODUODENOSCOPY (EGD);  Surgeon: Rogene Houston, MD;  Location: [facility] ENDO SUITE;  Service: Endoscopy;  Laterality: N/A;  . ESOPHAGOGASTRODUODENOSCOPY N/A 09/21/2016   Procedure: ESOPHAGOGASTRODUODENOSCOPY (EGD);  Surgeon: Rogene Houston, MD;  Location: [facility] ENDO SUITE;  Service: Endoscopy;  Laterality: N/A;  . GIVENS CAPSULE STUDY N/A 09/22/2016   Procedure: GIVENS CAPSULE STUDY;  Surgeon: Rogene Houston, MD;  Location: [facility] ENDO SU[facility];  Service: Endoscopy;  Laterality: N/A;  . MASTECTOMY Left 1998  . TOTAL KNEE ARTHROPLASTY Right    Dr.Harrison  . TUBAL LIGATION      Family History  Problem Relation Age of Onset  . Diabetes Father   . Pancreatic cancer Father   . Hypertension Brother    Social History:  reports that she quit smoking about 10 years ago. Her smoking use included cigarettes. She started smoking about 60 years ago. She has a 7.50 pack-year smoking history. Her smokeless tobacco use includes chew. She reports that she does not drink alcohol or use drugs.  Allergies:  Allergies  Allergen Reactions  . Macrobid [Nitrofurantoin Macrocrystal] Hives and Itching    Medications Prior to Admission  Medication Sig Dispense Refill  . acetaminophen (TYLENOL)  500 MG tablet Take 500 mg by mouth every 6 (six) hours as needed (FOR PAIN/HEADACHES).    [name] allopurinol (ZYLOPRIM) 100 MG tablet TAKE 1 TABLET (100 MG TOTAL) BY MOUTH DAILY. 90 tablet 0  . carvedilol (COREG) 6.25 MG tablet TAKE 1 TABLET BY MOUTH TWICE A DAY WITH MEALS 60 tablet 3  . colchicine (COLCRYS) 0.6 MG tablet Take 0.6 mg by mouth  daily.    . diphenhydrAMINE (BENADRYL) 25 mg capsule Take 25 mg by mouth 2 (two) times daily as needed for itching or allergies.     . hydroxychloroquine (PLAQUENIL) 200 MG tablet TAKE 1 TABLET (200 MG TOTAL) BY MOUTH DAILY. [place] (Patient taking differently: Take 200 mg by mouth See admin instructions. TAKE 1 TABLET (200 MG TOTAL) BY MOUTH DAILY. MONDAY THRU FRIDAY) 60 tablet 0  . lovastatin (MEVACOR) 40 MG tablet TAKE 1 TABLET (40 MG TOTAL) BY MOUTH AT BEDTIME. 30 tablet 6  . Magnesium 500 MG CAPS Take 500 mg by mouth at bedtime.    . pantoprazole (PROTONIX) 20 MG tablet Take 1 tablet (20 mg total) by mouth daily. 90 tablet 3    Results for orders placed or performed during the hospital encounter of 08/15/17 (from the past 48 hour(s))  I-STAT 4, (NA,K, GLUC, HGB,HCT)     Status: Abnormal   Collection Time: 08/15/17 12:46 PM  Result Value Ref Range   Sodium 142 135 - 145 mmol/L   Potassium 3.6 3.5 - 5.1 mmol/L   Glucose, Bld 73 65 - 99 mg/dL   HCT 24.0 (L) 36.0 - 46.0 %   Hemoglobin 8.2 (L) 12.0 - 15.0 g/dL   No results found.  Review of Systems  All other systems reviewed and are negative.   Blood pressure (!) 142/54, pulse 88, temperature 98.9 F (37.2 C), temperature source Oral, resp. rate 18, height 5\' 4"  (1.626 m), weight 64.4 kg (142 lb), SpO2 100 %. Physical Exam  Constitutional: She is oriented to person, place, and time. She appears well-developed and well-nourished.  HENT:  Head: Normocephalic and atraumatic.  Neck: Normal range of motion.  Cardiovascular: Normal rate.  Respiratory: Effort normal.  Musculoskeletal:       Right hand: She exhibits decreased range of motion, tenderness and swelling.  Right hand and wrist dorsal swelling with loss of active extension of the long, ring, and small fingers.  Neurological: She is alert and oriented to person, place, and time.  Skin: Skin is warm.  Psychiatric: She has a normal mood and affect. Her behavior is  normal. Judgment and thought content normal.     Assessment/Plan Patient is a 77 year old female with rheumatoid arthritis with chronic ruptures of her right long, ring, and small extensor tendons and significant dorsal synovitis.  We discussed with both the patient and her family member the need for exploration of above with synovectomy, tendon transfers, and distal ulnar resection.  They understand the risks and benefits of the procedure and the postoperative rehab necessary.  We will do this as an outpatient today.  Schuyler Amor, MD 08/15/2017, 1:21 PM

## 2017-08-15 NOTE — Telephone Encounter (Signed)
ok 

## 2017-08-15 NOTE — Op Note (Signed)
Please see dictated report #[id number]

## 2017-08-15 NOTE — Anesthesia Postprocedure Evaluation (Signed)
Anesthesia Post Note  Patient: [name]  Procedure(s) Performed: TENDON TRANSFER RIGHT WRIST EXTENSORS AS NEEDED, ULNA RESECTION AND STABILIZATION (Right Arm Lower)     Patient location during evaluation: PACU Anesthesia Type: General Level of consciousness: awake and alert Pain management: pain level controlled Vital Signs Assessment: post-procedure vital signs reviewed and stable Respiratory status: spontaneous breathing, nonlabored ventilation and respiratory function stable Cardiovascular status: blood pressure returned to baseline and stable Postop Assessment: no apparent nausea or vomiting Anesthetic complications: no    Last Vitals:  Vitals:   08/15/17 1552 08/15/17 1607  BP: (!) 150/70 (!) 149/72  Pulse: 81 72  Resp: 17 14  Temp:    SpO2: (!) 88% (!) 86%    Last Pain:  Vitals:   08/15/17 1222  TempSrc:   PainSc: 0-No pain                 Lynda Rainwater

## 2017-08-16 ENCOUNTER — Encounter (HOSPITAL_COMMUNITY): Payer: Self-pay | Admitting: Orthopedic Surgery

## 2017-08-16 DIAGNOSIS — M66241 Spontaneous rupture of extensor tendons, right hand: Secondary | ICD-10-CM | POA: Diagnosis not present

## 2017-08-16 DIAGNOSIS — M069 Rheumatoid arthritis, unspecified: Secondary | ICD-10-CM | POA: Diagnosis not present

## 2017-08-16 NOTE — Op Note (Signed)
[name], EDMUNDSON                 ACCOUNT NO.:  [id number]  MEDICAL RECORD NO.:  [id number]  LOCATION:                                 FACILITY:  PHYSICIAN:  Sheral Apley. [name], M.D.DATE OF BIRTH:  11-Dec-1940  DATE OF PROCEDURE:  08/15/2017 DATE OF DISCHARGE:                              OPERATIVE REPORT   PREOPERATIVE DIAGNOSIS:  Severe rheumatoid disease involving right hand and right wrist with ruptures of the long, ring, and small extensor tendons.  POSTOPERATIVE DIAGNOSIS:  Severe rheumatoid disease involving right hand and right wrist with ruptures of the long, ring, and small extensor tendons.  PROCEDURE:  Right hand and wrist dorsal synovectomies of the second, third, fourth, fifth, and sixth dorsal compartments as well as distal ulna resection and extensor carpi ulnaris tendon transfer for stabilization of distal ulna as well as extensor indicis proprius to extensor digitorum communis of long, ring, and small finger tendon transfers for ruptured extensor tendons.  SURGEON:  Sheral Apley. Burney Gauze, M.D.  ASSISTANT:  Julian Reil, P.A.  ANESTHESIA:  General.  COMPLICATION:  No complication.  DRAINS:  No drains.  SPECIMEN:  No specimen sent.  DESCRIPTION OF PROCEDURE:  The patient was taken to the operating suite and after induction of adequate general anesthetic, the right upper extremity was prepped and draped in the usual sterile fashion.  An Esmarch was used to exsanguinate the limb and the tourniquet was inflated to 250 mmHg.  At this point in time, a midline incision was made over the extensor tendons and retinaculum of the right hand and wrist in line with the long metacarpal.  The skin was incised 6-8 cm. Dissection was carried down to the extensor retinaculum.  The third dorsal compartment was identified and incised.  The EPL tendon was transposed after thorough synovectomy.  Next, the second and fourth dorsal compartments were identified.  The  second dorsal compartment was identified and a limited synovectomy was performed.  The fourth dorsal compartment retinaculum was carefully elevated to the ulnar side and visualization revealed ruptures of the long, ring, and small extensor tendons, but preservation of the index extensor tendons x2.  At this point in time, the fifth and sixth dorsal compartments were identified and opened and synovectomies were performed.  After this was done, we took the extensor indicis proprius tendon and transected it distally. We used it as a tendon transfer to the long, ring, and small fingers including the fifth dorsal compartment, which were all identified proximally and tied into one tendon juncture under the appropriate tension.  We then transferred the EIP tendon to the extensors of the long, ring, and small fingers.  This was done with a Pulvertaft weave using 3-0 FiberWire suture.  After the tendon transfer was completed, we subperiosteally dissected out the distal ulna and using a sagittal saw, removed the distal ulna.  We then created a distally-based flap of the extensor carpi ulnaris tendon and used it as a tendon transfer to stabilize the distal end of the ulna through a small drill hole.  This was fixated with the same 3-0 FiberWire suture.  We then thoroughly irrigated and closed the capsule of the  distal radioulnar joint with 3-0 FiberWire suture.  We then repaired the extensor retinaculum to cover our tendon transfer using 4-0 Vicryl and the skin was then loosely closed with a 3-0 Prolene subcuticular stitch.  Steri-Strips, 4x4s, compressive dressing, and dorsal and palmar splints were applied.  The patient tolerated these procedures well, went to the recovery room in stable fashion.     Sheral Apley Burney Gauze, M.D.     MAW/MEDQ  D:  08/15/2017  T:  08/16/2017  Job:  [id number]

## 2017-08-16 NOTE — Op Note (Deleted)
  The note originally documented on this encounter has been moved the the encounter in which it belongs.  

## 2017-08-19 ENCOUNTER — Encounter (HOSPITAL_COMMUNITY): Payer: Self-pay | Admitting: Orthopedic Surgery

## 2017-08-19 ENCOUNTER — Other Ambulatory Visit: Payer: Self-pay

## 2017-08-19 DIAGNOSIS — M25431 Effusion, right wrist: Secondary | ICD-10-CM | POA: Diagnosis not present

## 2017-08-19 DIAGNOSIS — M25531 Pain in right wrist: Secondary | ICD-10-CM | POA: Diagnosis not present

## 2017-08-19 DIAGNOSIS — S66811D Strain of other specified muscles, fascia and tendons at wrist and hand level, right hand, subsequent encounter: Secondary | ICD-10-CM | POA: Diagnosis not present

## 2017-08-19 DIAGNOSIS — S66811A Strain of other specified muscles, fascia and tendons at wrist and hand level, right hand, initial encounter: Secondary | ICD-10-CM | POA: Diagnosis not present

## 2017-08-19 NOTE — Patient Outreach (Signed)
[place] [facility] Care Management  08/19/2017  [name] [birth year] [id number]   Telephone Screen  Referral Date: 08/19/17 Referral Source: Urgent -MD office Referral Reason: " Patient had day surgery on 4/35/29, patient and family thinks she needs nursing services, no [facility] referral because surgeon did not give orders"  Insurance: [employer]   Outreach attempt # 1 to patient. No answer at both numbers listed for patient.     Plan: RN CM will make outreach attempt to patient within 3-4 business days. RN CM will send unsuccessful outreach letter to patient.    Enzo Montgomery, RN,BSN,CCM [place] Management Telephonic Care Management Coordinator Direct Phone: [phone] Toll Free: [phone] Fax: [phone]

## 2017-08-21 ENCOUNTER — Other Ambulatory Visit: Payer: Self-pay

## 2017-08-21 NOTE — Patient Outreach (Signed)
[name] [facility]) Care Management  08/21/2017  [name] 08/25/1940 [id number]    Telephone Screen  Referral Date: 08/19/17 Referral Source: Urgent -MD office Referral Reason: " Patient had day surgery on 4/35/29, patient and family thinks she needs nursing services, no [facility] referral because surgeon did not give orders" Insurance: [employer]   Incoming call from patient's [employer]. ROI on file. He voices that patient is [name] and that he normally helps her handle her affairs. RN CM discussed referral. He voiced that patient is in need of assistance with personal care due to limited use of dominant hand following recent surgery. He voices otherwise patient is doing okay. RN CM spoke at length with caregiver regarding [facility] services vs [facility] services. Advised him that [facility] does not provide in home personal care services. Caregiver does not feel like they need [facility] services at this time but is only requesting a [place] aide. Advised him that RN CM would contact PCP office to advise them of this. RN CM contacted PCP office. Spoke with office personnel and message left that RN CM had spoken with family an d they are requesting [place] aide services which [facility] does not provide.     Plan: RN CM will close case at this time.   Savannah Montgomery, RN,BSN,CCM [place] Management Telephonic Care Management Coordinator Direct Phone: [phone] Toll Free: [phone] Fax: [phone]

## 2017-08-26 ENCOUNTER — Ambulatory Visit (INDEPENDENT_AMBULATORY_CARE_PROVIDER_SITE_OTHER): Payer: Medicare HMO | Admitting: *Deleted

## 2017-08-26 DIAGNOSIS — I5042 Chronic combined systolic (congestive) and diastolic (congestive) heart failure: Secondary | ICD-10-CM

## 2017-08-26 DIAGNOSIS — I255 Ischemic cardiomyopathy: Secondary | ICD-10-CM | POA: Diagnosis not present

## 2017-08-26 DIAGNOSIS — Z95810 Presence of automatic (implantable) cardiac defibrillator: Secondary | ICD-10-CM

## 2017-08-26 NOTE — Progress Notes (Addendum)
EPIC Encounter for ICM Monitoring  Patient Name: [name] is a 77 y.o. female Date: 08/26/2017 Primary Care Physican: [name], MD Primary Cardiologist:Koneswaran/Lawrence NP Electrophysiologist: Lovena Le Dry Weight:unknown Bi-V Pacing: 90.8%       Attempted call to [name], newphew. Left detailed message regarding transmission.  Transmission reviewed.  Hospitalized 4/19 to 4/20 for hyperkalemia.     Thoracic impedance abnormal suggesting fluid accumulation since 08/05/2017 which correlates with hospitalization and Lasix has been on hold.  Impedance improved on 08/20/2017.   Prescribed dosage:  08/10/2017 Discharge instructions given to hold Furosemide   Labs: 08/15/2017  Potassium 3.6, Sodium 142 08/10/2017 Creatinine 2.19, BUN 61, Potassium 4.5, Sodium 142, EGFR 20-24  08/09/2017 Creatinine 2.88, BUN 71, Potassium 5.4, Sodium 139, EGFR 15-17, 10:09 PM results 08/09/2017 Creatinine 2.73, BUN 65, Potassium 6.1, Sodium 135, EGFR 16-18, 03:06 PM results   05/29/2017 Creatinine 1.81, BUN 45, Potassium 4.5, Sodium 138, EGFR 26-30 03/05/2017 Creatinine 2.27, BUN 47, Potassium 5.0, Sodium 137, EGFR 20-23 01/15/2017 Creatinine 1.56, BUN 37, Potassium 4.4, Sodium 139, EGFR 31-36 11/29/2016 Creatinine 1.68, BUN 30, Potassium 4.1, Sodium 138, EGFR 28-33 10/23/2016 Creatinine 2.08 BUN 31, Potassium 5.3, Sodium 136, EGFR 22-25 10/15/2016 Creatinine 0.99, BUN 18, Potassium 3.0, Sodium 136, EGFR 53->60 10/08/2016 Creatinine 0.95, BUN 16, Potassium 3.1, Sodium 140, EGFR 56->60 09/23/2016 Creatinine 0.74, BUN 13, Potassium 4.0, Sodium 141, EGFR >60 09/22/2016 Creatinine 0.90, BUN 15, Potassium 3.9, Sodium 141, EGFR >60  09/21/2016 Creatinine 1.05, BUN 26, Potassium 3.7, Sodium 140, EGFR 50-57  09/20/2016 Creatinine 1.53, BUN 37, Potassium 3.9, Sodium 140, EGFR 31-36 09/05/2016 Creatinine 1.25, BUN 29, Potassium 4.0, Sodium 137, EGFR 40-47  08/08/2016 Creatinine 1.10, BUN 24,  Potassium 3.6, Sodium 136, EGFR 47-55 07/19/2016 Creatinine 1.26, BUN 29, Potassium 3.6, Sodium 139, EGFR 40-46 06/22/2016 Creatinine 1.44, BUN 37, Potassium 3.7, Sodium 140, EGFR 34-39 06/01/2016 Creatinine 1.68, BUN 43, Potassium 4.7, Sodium 132, EGFR 28-33  Recommendations:  Dr [name] will provide any recommendations if needed during office visit on 08/28/2017.  Follow-up plan: ICM clinic phone appointment on 09/09/2017 to recheck fluid levels.  Office appointment scheduled 08/28/2017 with Dr. [name].  Copy of ICM check sent to Dr. [name] and Dr. Rayann Heman.   3 month ICM trend: 08/26/2017    1 Year ICM trend:       [name], RN 08/26/2017 3:46 PM

## 2017-08-27 NOTE — Progress Notes (Signed)
Remote ICD transmission.   

## 2017-08-28 ENCOUNTER — Inpatient Hospital Stay (HOSPITAL_COMMUNITY): Payer: Medicare HMO | Attending: Hematology

## 2017-08-28 ENCOUNTER — Encounter: Payer: Self-pay | Admitting: Cardiovascular Disease

## 2017-08-28 ENCOUNTER — Ambulatory Visit (INDEPENDENT_AMBULATORY_CARE_PROVIDER_SITE_OTHER): Payer: Medicare HMO | Admitting: Cardiovascular Disease

## 2017-08-28 ENCOUNTER — Other Ambulatory Visit (HOSPITAL_COMMUNITY)
Admission: RE | Admit: 2017-08-28 | Discharge: 2017-08-28 | Disposition: A | Discharge status: Home / Self Care | Payer: Medicare HMO | Source: Ambulatory Visit | Attending: Cardiovascular Disease | Admitting: Cardiovascular Disease

## 2017-08-28 ENCOUNTER — Encounter: Payer: Self-pay | Admitting: Cardiology

## 2017-08-28 VITALS — BP 116/64 | HR 81 | Ht 65.0 in | Wt 142.0 lb

## 2017-08-28 DIAGNOSIS — Z9289 Personal history of other medical treatment: Secondary | ICD-10-CM | POA: Diagnosis not present

## 2017-08-28 DIAGNOSIS — I130 Hypertensive heart and chronic kidney disease with heart failure and stage 1 through stage 4 chronic kidney disease, or unspecified chronic kidney disease: Secondary | ICD-10-CM | POA: Diagnosis not present

## 2017-08-28 DIAGNOSIS — I5042 Chronic combined systolic (congestive) and diastolic (congestive) heart failure: Secondary | ICD-10-CM

## 2017-08-28 DIAGNOSIS — N184 Chronic kidney disease, stage 4 (severe): Secondary | ICD-10-CM | POA: Insufficient documentation

## 2017-08-28 DIAGNOSIS — Z79899 Other long term (current) drug therapy: Secondary | ICD-10-CM | POA: Insufficient documentation

## 2017-08-28 DIAGNOSIS — D631 Anemia in chronic kidney disease: Secondary | ICD-10-CM | POA: Insufficient documentation

## 2017-08-28 DIAGNOSIS — Z95810 Presence of automatic (implantable) cardiac defibrillator: Secondary | ICD-10-CM | POA: Diagnosis not present

## 2017-08-28 DIAGNOSIS — D500 Iron deficiency anemia secondary to blood loss (chronic): Secondary | ICD-10-CM | POA: Diagnosis not present

## 2017-08-28 DIAGNOSIS — Q2733 Arteriovenous malformation of digestive system vessel: Secondary | ICD-10-CM | POA: Diagnosis not present

## 2017-08-28 DIAGNOSIS — I25118 Atherosclerotic heart disease of native coronary artery with other forms of angina pectoris: Secondary | ICD-10-CM

## 2017-08-28 DIAGNOSIS — D649 Anemia, unspecified: Secondary | ICD-10-CM

## 2017-08-28 DIAGNOSIS — Z853 Personal history of malignant neoplasm of breast: Secondary | ICD-10-CM

## 2017-08-28 DIAGNOSIS — D508 Other iron deficiency anemias: Secondary | ICD-10-CM

## 2017-08-28 LAB — IRON AND TIBC
IRON: 67 ug/dL (ref 28–170)
Saturation Ratios: 36 % — ABNORMAL HIGH (ref 10.4–31.8)
TIBC: 188 ug/dL — ABNORMAL LOW (ref 250–450)
UIBC: 121 ug/dL

## 2017-08-28 LAB — CBC WITH DIFFERENTIAL/PLATELET
BASOS ABS: 0 10*3/uL (ref 0.0–0.1)
Basophils Relative: 1 %
EOS ABS: 0.2 10*3/uL (ref 0.0–0.7)
EOS PCT: 3 %
HCT: 28.4 % — ABNORMAL LOW (ref 36.0–46.0)
Hemoglobin: 8.7 g/dL — ABNORMAL LOW (ref 12.0–15.0)
LYMPHS PCT: 29 %
Lymphs Abs: 1.4 10*3/uL (ref 0.7–4.0)
MCH: 27.4 pg (ref 26.0–34.0)
MCHC: 30.6 g/dL (ref 30.0–36.0)
MCV: 89.3 fL (ref 78.0–100.0)
Monocytes Absolute: 0.6 10*3/uL (ref 0.1–1.0)
Monocytes Relative: 13 %
NEUTROS PCT: 54 %
Neutro Abs: 2.6 10*3/uL (ref 1.7–7.7)
PLATELETS: 234 10*3/uL (ref 150–400)
RBC: 3.18 MIL/uL — AB (ref 3.87–5.11)
RDW: 17.2 % — ABNORMAL HIGH (ref 11.5–15.5)
WBC: 4.8 10*3/uL (ref 4.0–10.5)

## 2017-08-28 LAB — COMPREHENSIVE METABOLIC PANEL
ALT: 16 U/L (ref 14–54)
AST: 22 U/L (ref 15–41)
Albumin: 3.3 g/dL — ABNORMAL LOW (ref 3.5–5.0)
Alkaline Phosphatase: 106 U/L (ref 38–126)
Anion gap: 8 (ref 5–15)
BUN: 30 mg/dL — AB (ref 6–20)
CHLORIDE: 100 mmol/L — AB (ref 101–111)
CO2: 30 mmol/L (ref 22–32)
Calcium: 9 mg/dL (ref 8.9–10.3)
Creatinine, Ser: 1.24 mg/dL — ABNORMAL HIGH (ref 0.44–1.00)
GFR calc Af Amer: 47 mL/min — ABNORMAL LOW (ref >60)
GFR calc non Af Amer: 41 mL/min — ABNORMAL LOW (ref >60)
GLUCOSE: 112 mg/dL — AB (ref 65–99)
Potassium: 3.6 mmol/L (ref 3.5–5.1)
SODIUM: 138 mmol/L (ref 135–145)
Total Bilirubin: 0.5 mg/dL (ref 0.3–1.2)
Total Protein: 7.7 g/dL (ref 6.5–8.1)

## 2017-08-28 LAB — FERRITIN: Ferritin: 566 ng/mL — ABNORMAL HIGH (ref 11–307)

## 2017-08-28 NOTE — Patient Instructions (Signed)
Medication Instructions:  Your physician recommends that you continue on your current medications as directed. Please refer to the Current Medication list given to you today.   Labwork: BMET   Testing/Procedures: NONE  Follow-Up: Your physician recommends that you schedule a follow-up appointment in: 3 MONTHS     Any Other Special Instructions Will Be Listed Below (If Applicable).     If you need a refill on your cardiac medications before your next appointment, please call your pharmacy.   

## 2017-08-28 NOTE — Progress Notes (Signed)
SUBJECTIVE: The patient presents for routine follow-up.  She was hospitalized recently in April for acute on chronic kidney disease stage IV with mild hyperkalemia.  Lasix and lisinopril were held at the time of discharge.  She appeared hypovolemic and was given a liter of IV fluids.   She has a history of chronic systolic heart failure and has a BiV ICD. She also has coronary artery disease with a remote history of myocardial infarction.  Echocardiogram 07/01/15 showed moderately reduced left ventricular systolic function, LVEF 22-02%, mild LVH, grade 1 diastolic dysfunction with elevated filling pressures, and mild mitral and tricuspid regurgitation.   She tells me that her leg started swelling and she went and saw her PCP who instructed her to resume Lasix.  She is not taking lisinopril or potassium.  She feels well.  She very seldom has chest tightness.  Chronic exertional dyspnea is stable.  She denies leg swelling, orthopnea, and paroxysmal nocturnal dyspnea.  BUN and creatinine on 08/10/2017 was 61 and 2.19, respectively.    Review of Systems: As per "subjective", otherwise negative.  Allergies  Allergen Reactions  . Macrobid [Nitrofurantoin Macrocrystal] Hives and Itching    Current Outpatient Medications  Medication Sig Dispense Refill  . acetaminophen (TYLENOL) 500 MG tablet Take 500 mg by mouth every 6 (six) hours as needed (FOR PAIN/HEADACHES).    [name] allopurinol (ZYLOPRIM) 100 MG tablet TAKE 1 TABLET (100 MG TOTAL) BY MOUTH DAILY. 90 tablet 0  . carvedilol (COREG) 6.25 MG tablet TAKE 1 TABLET BY MOUTH TWICE A DAY WITH MEALS 60 tablet 3  . colchicine (COLCRYS) 0.6 MG tablet Take 0.6 mg by mouth daily.    . diphenhydrAMINE (BENADRYL) 25 mg capsule Take 25 mg by mouth 2 (two) times daily as needed for itching or allergies.     . hydroxychloroquine (PLAQUENIL) 200 MG tablet TAKE 1 TABLET (200 MG TOTAL) BY MOUTH DAILY. MONDAY THRU FRIDAY 60 tablet 0  . lovastatin  (MEVACOR) 40 MG tablet TAKE 1 TABLET (40 MG TOTAL) BY MOUTH AT BEDTIME. 30 tablet 6  . Magnesium 500 MG CAPS Take 500 mg by mouth at bedtime.    . pantoprazole (PROTONIX) 20 MG tablet Take 1 tablet (20 mg total) by mouth daily. 90 tablet 3   No current facility-administered medications for this visit.     Past Medical History:  Diagnosis Date  . Anemia    Hgb of 9-10  . Anemia of chronic disease    Hgb of 9-10 chronically; 06/2010: H&H-10.7/33.5, MCV-81, normal iron studies in 2010   . Arteriosclerotic cardiovascular disease (ASCVD)    Remote PTCA by patient report; LBBB; associated cardiomyopathy, presumed ischemic with EF 40-45% previously, 20% in 06/2009; h/o clinical congestive heart failure; negative stress nuclear in 2009 with inferoseptal and apical scar  . Automatic implantable cardioverter-defibrillator in situ   . Breast cancer ([place])    breast  . Chronic bronchitis ([place])   . Chronic renal disease, stage 3, moderately decreased glomerular filtration rate (GFR) between 30-59 mL/min/1.73 square meter (HCC) 02/01/2016  . Congestive heart failure (CHF) ([place])   . Elevated cholesterol   . Elevated sed rate   . GERD (gastroesophageal reflux disease)   . GI bleed   . Gout   . HOH (hard of hearing)   . Hyperlipidemia   . Hypertension   . Rheumatoid arthritis ([place])   . UTI (urinary tract infection)     Past Surgical History:  Procedure Laterality Date  .  BI-VENTRICULAR IMPLANTABLE CARDIOVERTER DEFIBRILLATOR N/A 07/28/2012   Procedure: BI-VENTRICULAR IMPLANTABLE CARDIOVERTER DEFIBRILLATOR  (CRT-D);  Surgeon: Evans Lance, MD;  Location: [facility] CATH LAB;  Service: Cardiovascular;  Laterality: N/A;  . BI-VENTRICULAR IMPLANTABLE CARDIOVERTER DEFIBRILLATOR  (CRT-D)  07/28/2012  . BREAST BIOPSY Bilateral   . CATARACT EXTRACTION W/ INTRAOCULAR LENS IMPLANT Left   . CATARACT EXTRACTION W/PHACO Right 09/15/2013   Procedure: CATARACT EXTRACTION PHACO AND INTRAOCULAR LENS PLACEMENT (IOC);   Surgeon: Elta Guadeloupe T. Gershon Crane, MD;  Location: [facility] ORS;  Service: Ophthalmology;  Laterality: Right;  CDE:  10.74  . COLONOSCOPY N/A 03/04/2015   Procedure: COLONOSCOPY;  Surgeon: Rogene Houston, MD;  Location: [facility] ENDO SU[facility];  Service: Endoscopy;  Laterality: N/A;  10:50   . ESOPHAGOGASTRODUODENOSCOPY N/A 03/04/2015   Procedure: ESOPHAGOGASTRODUODENOSCOPY (EGD);  Surgeon: Rogene Houston, MD;  [facility]: [facility];  Service: Endoscopy;  Laterality: N/A;  . ESOPHAGOGASTRODUODENOSCOPY N/A 09/21/2016   Procedure: ESOPHAGOGASTRODUODENOSCOPY (EGD);  Surgeon: Rogene Houston, MD;  [facility];  Service: Endoscopy;  Laterality: N/A;  . GIVENS CAPSULE STUDY N/A 09/22/2016   Procedure: GIVENS CAPSULE STUDY;  Surgeon: Rogene Houston, MD;  [facility]: [facility];  Service: Endoscopy;  Laterality: N/A;  . MASTECTOMY Left 1998  . TENDON TRANSFER Right 08/15/2017   Procedure: TENDON TRANSFER RIGHT WRIST EXTENSORS AS NEEDED, ULNA RESECTION AND STABILIZATION;  Surgeon: Charlotte Crumb, MD;  Location: [facility];  Service: Orthopedics;  Laterality: Right;  . TOTAL KNEE ARTHROPLASTY Right    Dr.Harrison  . TUBAL LIGATION      Social History   Socioeconomic History  . Marital status: Married    Spouse name: Not on file  . Number of children: Not on file  . Years of education: Not on file  . Highest education level: Not on file  Occupational History  . Occupation: Retired  Scientific laboratory technician  . Financial resource strain: Not very hard  . Food insecurity:    Worry: Never true    Inability: Never true  . Transportation needs:    Medical: No    Non-medical: No  Tobacco Use  . Smoking status: Former Smoker    Packs/day: 0.25    Years: 30.00    Pack years: 7.50    Types: Cigarettes    Start date: 09/17/1956    Last attempt to quit: 04/24/2007    Years since quitting: 10.3  . Smokeless tobacco: Current User    Types: Chew  . Tobacco comment: 07/03/2013 "smoked some; don't know how much or how long or  when I quit"  Substance and Sexual Activity  . Alcohol use: No    Alcohol/week: 0.0 oz  . Drug use: No  . Sexual activity: Not Currently    Birth control/protection: Post-menopausal  Lifestyle  . Physical activity:    Days per week: Not on file    Minutes per session: Not on file  . Stress: Not on file  Relationships  . Social connections:    Talks on phone: Not on file    Gets together: Not on file    Attends religious service: Not on file    Active member of club or organization: Not on file    Attends meetings of clubs or organizations: Not on file    Relationship status: Not on file  . Intimate partner violence:    Fear of current or ex partner: No    Emotionally abused: No    Physically abused: No    Forced sexual  activity: No  Other Topics Concern  . Not on file  Social History Narrative   Married, lives with spouse   1 daughter, died in 58 in a car accident   Retired - worked in [employer] fundraiser   No recent travel     [place]:   08/28/17 1106  BP: 116/64  Pulse: 81  SpO2: 97%  Weight: 142 lb (64.4 kg)  Height: 5\' 5"  (1.651 m)    Wt Readings from Last 3 Encounters:  08/28/17 142 lb (64.4 kg)  08/15/17 142 lb (64.4 kg)  08/10/17 142 lb 13.7 oz (64.8 kg)     PHYSICAL EXAM General: NAD HEENT: Normal. Neck: No JVD, no thyromegaly. Lungs: Clear to auscultation bilaterally with normal respiratory effort. CV: Regular rate and rhythm, normal S1/S2, no K4/Y1, 2/6 systolic murmur heard throughout precordium. No pretibial or periankle edema.  No carotid bruit.   Abdomen: Soft, nontender, no distention.  Neurologic: Alert and oriented.  Psych: Normal affect. Skin: Normal. Musculoskeletal: No gross deformities.    ECG: Most recent ECG reviewed.   Labs: Lab Results  Component Value Date/Time   K 3.6 08/15/2017 12:46 PM   BUN 61 (H) 08/10/2017 06:45 AM   CREATININE 2.19 (H) 08/10/2017 06:45 AM   CREATININE 1.23 (H) 09/17/2014 01:47 PM   ALT 20 05/29/2017  02:10 PM   TSH 0.501 07/03/2013 06:40 PM   HGB 8.2 (L) 08/15/2017 12:46 PM     Lipids: Lab Results  Component Value Date/Time   LDLCALC 83 05/23/2012 09:05 AM   CHOL 134 05/23/2012 09:05 AM   TRIG 103 05/23/2012 09:05 AM   HDL 30 (L) 05/23/2012 09:05 AM       ASSESSMENT AND PLAN:  1.  Chronic combined systolic and diastolic heart failure: Euvolemic and stable.    Weight is stable at 142 pounds.  Continue carvedilol.  Lasix and lisinopril were held at the time of discharge but due to bilateral leg swelling she spoke to her PCP who instructed her to resume Lasix 40 mg daily. She has a BiV ICD.  I will check a basic metabolic panel.  2.  Coronary artery disease with remote history of MI: Symptomatically stable.  Continue lovastatin and carvedilol at present doses.  She is presumably not on aspirin due to anemia.  Most recent upper and small bowel endoscopy results previously reviewed.  She had angiodysplastic lesions in the jejunum.  3.  Biventricular ICD: Device interrogation reviewed with normal device function.    Thoracic impedance trends were stable.  She follows with Dr. Lovena Le.  4.  Chronic kidney disease stage IV: BUN and creatinine on 08/10/2017 was 61 and 2.19, respectively.  She is back to taking Lasix, I will check a basic metabolic panel.   Disposition: Follow up 3 months   Kate Sable, M.D., F.A.C.C.

## 2017-08-29 LAB — KAPPA/LAMBDA LIGHT CHAINS
KAPPA FREE LGHT CHN: 43.3 mg/L — AB (ref 3.3–19.4)
Kappa, lambda light chain ratio: 1.16 (ref 0.26–1.65)
Lambda free light chains: 37.4 mg/L — ABNORMAL HIGH (ref 5.7–26.3)

## 2017-08-29 LAB — IGG, IGA, IGM
IGG (IMMUNOGLOBIN G), SERUM: 1361 mg/dL (ref 700–1600)
IGM (IMMUNOGLOBULIN M), SRM: 225 mg/dL — AB (ref 26–217)
IgA: 404 mg/dL (ref 64–422)

## 2017-08-29 LAB — PROTEIN ELECTROPHORESIS, SERUM
A/G Ratio: 0.8 (ref 0.7–1.7)
ALPHA-1-GLOBULIN: 0.3 g/dL (ref 0.0–0.4)
Albumin ELP: 3.2 g/dL (ref 2.9–4.4)
Alpha-2-Globulin: 1.1 g/dL — ABNORMAL HIGH (ref 0.4–1.0)
Beta Globulin: 1 g/dL (ref 0.7–1.3)
GAMMA GLOBULIN: 1.4 g/dL (ref 0.4–1.8)
Globulin, Total: 3.8 g/dL (ref 2.2–3.9)
TOTAL PROTEIN ELP: 7 g/dL (ref 6.0–8.5)

## 2017-08-30 LAB — IMMUNOFIXATION ELECTROPHORESIS
IgA: 393 mg/dL (ref 64–422)
IgG (Immunoglobin G), Serum: 1379 mg/dL (ref 700–1600)
IgM (Immunoglobulin M), Srm: 218 mg/dL — ABNORMAL HIGH (ref 26–217)
TOTAL PROTEIN ELP: 7.3 g/dL (ref 6.0–8.5)

## 2017-08-30 NOTE — Progress Notes (Signed)
Office Visit Note  Patient: [name]             Date of Birth: 1941/02/03           MRN: [id number]             PCP: [name], MD Referring: [name], MD Visit Date: 09/11/2017 Occupation: _0 @  Accompanied by nephew- [name]  Subjective:  Medication management.   History of Present Illness: HONESTY MENTA is a 77 y.o. female with history of seropositive rheumatoid arthritis.  She reports no increased joint pain or discomfort.  She had surgery by Dr. Burney Gauze in April 2019 for right hand extensor tendon rupture.  She is gradually recovering from that.  She denies any discomfort in her knee joints of feet.  She denies any gout flare since her last visit.  Activities of Daily Living:  Patient reports morning stiffness for 10 minutes.   Patient Denies nocturnal pain.  Difficulty dressing/grooming: Denies Difficulty climbing stairs: Denies Difficulty getting out of chair: Reports Difficulty using hands for taps, buttons, cutlery, and/or writing: Reports   Review of Systems  Constitutional: Negative for fatigue, night sweats, weight gain and weight loss.  HENT: Negative for mouth sores, trouble swallowing, trouble swallowing, mouth dryness and nose dryness.   Eyes: Negative for pain, redness, visual disturbance and dryness.  Respiratory: Negative for cough, shortness of breath and difficulty breathing.   Cardiovascular: Negative for chest pain, palpitations, hypertension, irregular heartbeat and swelling in legs/feet.  Gastrointestinal: Negative for blood in stool, constipation and diarrhea.  Endocrine: Negative for increased urination.  Genitourinary: Negative for vaginal dryness.  Musculoskeletal: Negative for arthralgias, joint pain, joint swelling, myalgias, muscle weakness, morning stiffness, muscle tenderness and myalgias.  Skin: Negative for color change, rash, hair loss, skin tightness, ulcers and sensitivity to sunlight.  Allergic/Immunologic: Negative  for susceptible to infections.  Neurological: Negative for dizziness, memory loss, night sweats and weakness.  Hematological: Negative for swollen glands.  Psychiatric/Behavioral: Negative for depressed mood and sleep disturbance. The patient is not nervous/anxious.     PMFS History:  Patient Active Problem List   Diagnosis Date Noted  . AKI (acute kidney injury) ([place]) 08/10/2017  . Hyperkalemia 08/10/2017  . Acute blood loss anemia 09/20/2016  . Ischemic cardiomyopathy 09/20/2016  . Chronic combined systolic and diastolic CHF (congestive heart failure) ([place]) 09/20/2016  . CKD (chronic kidney disease), stage IV ([place]) 09/20/2016  . History of breast cancer 08/02/2016  . Primary osteoarthritis of both knees 08/02/2016  . Primary osteoarthritis of both feet 08/02/2016  . History of anemia 07/17/2016  . History of CHF (congestive heart failure) 06/04/2016  . History of chronic kidney disease 06/04/2016  . Symptomatic anemia 06/04/2016  . Elevated sedimentation rate 03/03/2016  . Idiopathic chronic gout of multiple sites without tophus 03/03/2016  . Total knee replacement status, right 03/03/2016  . High risk medication use 03/03/2016  . Chronic kidney disease (CKD) stage G3b/A1, moderately decreased glomerular filtration rate (GFR) between 30-44 mL/min/1.73 square meter and albuminuria creatinine ratio less than 30 mg/g (HCC) 02/01/2016  . CAD S/P remote PCI- no details 09/02/2014  . Cardiomyopathy, ischemic-EF 30-35% March 2015 09/02/2014  . Diastolic dysfunction-grade 2 09/02/2014  . CHF exacerbation ([place]) 08/28/2014  . Acute on chronic combined systolic and diastolic congestive heart failure ([place]) 08/28/2014  . Iron deficiency anemia 08/20/2013  . Malnutrition of moderate degree ([place]) 07/21/2013  . PNA (pneumonia) 07/19/2013  . HCAP (healthcare-associated pneumonia) 07/17/2013  . Sepsis ([place]) 07/17/2013  .  ARF (acute renal failure) (HCC) 07/17/2013  . Rheumatoid arthritis (HCC)  07/04/2013  . Hypokalemia 07/03/2013  . Chest pain 07/03/2013  . Biventricular ICD in place (MDT 2014) 11/06/2012  . Anemia of chronic disease   . Breast cancer (HCC)   . Hypertension   . Dyslipidemia 05/24/2009  . Chronic systolic heart failure (HCC) 05/24/2009  . Primary osteoarthritis of both hands 08/11/2007    Past Medical History:  Diagnosis Date  . Anemia    Hgb of 9-10  . Anemia of chronic disease    Hgb of 9-10 chronically; 06/2010: H&H-10.7/33.5, MCV-81, normal iron studies in 2010   . Arteriosclerotic cardiovascular disease (ASCVD)    Remote PTCA by patient report; LBBB; associated cardiomyopathy, presumed ischemic with EF 40-45% previously, 20% in 06/2009; h/o clinical congestive heart failure; negative stress nuclear in 2009 with inferoseptal and apical scar  . Automatic implantable cardioverter-defibrillator in situ   . Breast cancer (HCC)    breast  . Chronic bronchitis (HCC)   . Chronic renal disease, stage 3, moderately decreased glomerular filtration rate (GFR) between 30-59 mL/min/1.73 square meter (HCC) 02/01/2016  . Congestive heart failure (CHF) (HCC)   . Elevated cholesterol   . Elevated sed rate   . GERD (gastroesophageal reflux disease)   . GI bleed   . Gout   . HOH (hard of hearing)   . Hyperlipidemia   . Hypertension   . Rheumatoid arthritis (HCC)   . UTI (urinary tract infection)     Family History  Problem Relation Age of Onset  . Diabetes Father   . Pancreatic cancer Father   . Hypertension Brother    Past Surgical History:  Procedure Laterality Date  . BI-VENTRICULAR IMPLANTABLE CARDIOVERTER DEFIBRILLATOR N/A 07/28/2012   Procedure: BI-VENTRICULAR IMPLANTABLE CARDIOVERTER DEFIBRILLATOR  (CRT-D);  Surgeon: Gregg W Taylor, MD;  Location: [facility] CATH LAB;  Service: Cardiovascular;  Laterality: N/A;  . BI-VENTRICULAR IMPLANTABLE CARDIOVERTER DEFIBRILLATOR  (CRT-D)  07/28/2012  . BREAST BIOPSY Bilateral   . CATARACT EXTRACTION W/ INTRAOCULAR LENS  IMPLANT Left   . CATARACT EXTRACTION W/PHACO Right 09/15/2013   Procedure: CATARACT EXTRACTION PHACO AND INTRAOCULAR LENS PLACEMENT (IOC);  Surgeon: Mark T. Shapiro, MD;  Location: [facility] OR[facility];  Service: Ophthalmology;  Laterality: Right;  CDE:  10.74  . COLONOSCOPY N/A 03/04/2015   Procedure: COLONOSCOPY;  Surgeon: [name], MD;  [facility];  Service: Endoscopy;  Laterality: N/A;  10:50   . ESOPHAGOGASTRODUODENOSCOPY N/A 03/04/2015   Procedure: ESOPHAGOGASTRODUODENOSCOPY (EGD);  Surgeon: [name], MD;  [facility];  Service: Endoscopy;  Laterality: N/A;  . ESOPHAGOGASTRODUODENOSCOPY N/A 09/21/2016   Procedure: ESOPHAGOGASTRODUODENOSCOPY (EGD);  Surgeon: Rehman, Najeeb U, MD;  [facility];  Service: Endoscopy;  Laterality: N/A;  . GIVENS CAPSULE STUDY N/A 09/22/2016   Procedure: GIVENS CAPSULE STUDY;  Surgeon: Rehman, Najeeb U, MD;  [facility];  Service: Endoscopy;  Laterality: N/A;  . MASTECTOMY Left 1998  . TENDON TRANSFER Right 08/15/2017   Procedure: TENDON TRANSFER RIGHT WRIST EXTENSORS AS NEEDED, ULNA RESECTION AND STABILIZATION;  Surgeon: Weingold, Matthew, MD;  Location: [facility] OR;  Service: Orthopedics;  Laterality: Right;  . TOTAL KNEE ARTHROPLASTY Right    Dr.Harrison  . TUBAL LIGATION     Social History   Social History Narrative   Married, lives with spouse   1 daughter, died in 1989 in a car accident   Retired - worked in textiles   No recent travel       Objective: Vital Signs: BP 115/73 (BP Location: Right Arm, Patient Position: Sitting, Cuff Size: Normal)   Pulse 75   Resp 16   Ht 5' 1.42" (1.56 m)   Wt 147 lb (66.7 kg)   BMI 27.40 kg/m    Physical Exam  Constitutional: She is oriented to person, place, and time. She appears well-developed and well-nourished.  HENT:  Head: Normocephalic and atraumatic.  Eyes: Conjunctivae and EOM are normal.  Neck: Normal range of motion.  Cardiovascular: Normal rate, regular  rhythm, normal heart sounds and intact distal pulses.  Pulmonary/Chest: Effort normal and breath sounds normal.  Abdominal: Soft. Bowel sounds are normal.  Lymphadenopathy:    She has no cervical adenopathy.  Neurological: She is alert and oriented to person, place, and time.  Skin: Skin is warm and dry. Capillary refill takes less than 2 seconds.  Psychiatric: She has a normal mood and affect. Her behavior is normal.  Nursing note and vitals reviewed.    Musculoskeletal Exam: C-spine good range of motion.  She has some thoracic kyphosis.  Shoulder joints and elbow joints were in good range of motion.  Her right forearm and hand was in a cast although she was able to lift her fingers now.  She had mild tenosynovitis in the left wrist joint.  No synovitis was noted in her hands.  Right total knee replacement is doing well.  CDAI Exam: CDAI Homunculus Exam:   Swelling:  Left hand: 3rd MCP  Joint Counts:  CDAI Tender Joint count: 0 CDAI Swollen Joint count: 1  Global Assessments:  Patient Global Assessment: 2 Provider Global Assessment: 2  CDAI Calculated Score: 5    Investigation: No additional findings.PLQ eye exam: 01/08/2017 CBC Latest Ref Rng & Units 08/28/2017 08/15/2017 08/09/2017  WBC 4.0 - 10.5 K/uL 4.8 - 7.9  Hemoglobin 12.0 - 15.0 g/dL 8.7(L) 8.2(L) 9.6(L)  Hematocrit 36.0 - 46.0 % 28.4(L) 24.0(L) 30.8(L)  Platelets 150 - 400 K/uL 234 - 189   CMP Latest Ref Rng & Units 08/28/2017 08/15/2017 08/10/2017  Glucose 65 - 99 mg/dL 112(H) 73 80  BUN 6 - 20 mg/dL 30(H) - 61(H)  Creatinine 0.44 - 1.00 mg/dL 1.24(H) - 2.19(H)  Sodium 135 - 145 mmol/L 138 142 142  Potassium 3.5 - 5.1 mmol/L 3.6 3.6 4.5  Chloride 101 - 111 mmol/L 100(L) - 113(H)  CO2 22 - 32 mmol/L 30 - 17(L)  Calcium 8.9 - 10.3 mg/dL 9.0 - 8.7(L)  Total Protein 6.5 - 8.1 g/dL 7.7 - -  Total Bilirubin 0.3 - 1.2 mg/dL 0.5 - -  Alkaline Phos 38 - 126 U/L 106 - -  AST 15 - 41 U/L 22 - -  ALT 14 - 54 U/L 16 - -     Imaging: No results found.  Speciality Comments: PLQ eye exam: 01/08/2017 Normal. [employer]. Follow up in 6 months.    Procedures:  No procedures performed Allergies: Macrobid [nitrofurantoin macrocrystal]   Assessment / Plan:     Visit Diagnoses: Rheumatoid arthritis involving multiple sites with positive rheumatoid factor (HCC) - +RF, +CCP, Her methotrexate was discontinued due to low GFR.  She had mild tenosynovitis in her left wrist and some synovial thickening over left third MCP joint.  Otherwise she is doing quite well on Plaquenil monotherapy.  High risk medication use - PLQ.eye exam: 01/08/2017.  Her labs have been stable.  We will recheck her labs at follow-up visit.  Rupture of extensor tendon of right hand, subsequent encounter -  she had surgery by Dr. Weingold in April 2019.  She had good range of motion in her right third fourth and fifth digit today.  Her right forearm and hand is a still in the brace.  Primary osteoarthritis of left knee-she is not having much discomfort.  Total knee replacement status, right - Dr. Harrison.  Doing well.  Primary osteoarthritis of both feet-she does not have any complaints.  Idiopathic chronic gout of multiple sites without tophus - allopurinol 100 mg by mouth daily.  She has not had a flare since the last visit. Other medical problems are listed as follows:  Elevated sedimentation rate - ESR> 100 followed by oncologist    Chronic kidney disease (CKD) stage G3b/A1, moderately decreased glomerular filtration rate (GFR) between 30-44 mL/min/1.73 square meter and albuminuria creatinine ratio less than 30 mg/g (HCC)  History of anemia  History of breast cancer - Dr.Penland 1992 with no recurrence.  Dyslipidemia  Chronic systolic heart failure (HCC)    Orders: No orders of the defined types were placed in this encounter.  No orders of the defined types were placed in this encounter.    Follow-Up  Instructions: Return in about 5 months (around 02/11/2018) for Rheumatoid arthritis.   Shaili Deveshwar, MD  Note - This record has been created using Dragon software.  Chart creation errors have been sought, but may not always  have been located. Such creation errors do not reflect on  the standard of medical care. 

## 2017-09-04 ENCOUNTER — Encounter (HOSPITAL_COMMUNITY): Payer: Self-pay | Admitting: Internal Medicine

## 2017-09-04 ENCOUNTER — Other Ambulatory Visit: Payer: Self-pay

## 2017-09-04 ENCOUNTER — Inpatient Hospital Stay (HOSPITAL_COMMUNITY): Payer: Medicare HMO | Attending: Hematology | Admitting: Internal Medicine

## 2017-09-04 VITALS — BP 132/55 | HR 85 | Temp 98.5°F | Resp 16 | Wt 144.4 lb

## 2017-09-04 DIAGNOSIS — I2510 Atherosclerotic heart disease of native coronary artery without angina pectoris: Secondary | ICD-10-CM

## 2017-09-04 DIAGNOSIS — Q2733 Arteriovenous malformation of digestive system vessel: Secondary | ICD-10-CM | POA: Diagnosis not present

## 2017-09-04 DIAGNOSIS — E7800 Pure hypercholesterolemia, unspecified: Secondary | ICD-10-CM

## 2017-09-04 DIAGNOSIS — E785 Hyperlipidemia, unspecified: Secondary | ICD-10-CM

## 2017-09-04 DIAGNOSIS — N184 Chronic kidney disease, stage 4 (severe): Secondary | ICD-10-CM | POA: Diagnosis not present

## 2017-09-04 DIAGNOSIS — I130 Hypertensive heart and chronic kidney disease with heart failure and stage 1 through stage 4 chronic kidney disease, or unspecified chronic kidney disease: Secondary | ICD-10-CM

## 2017-09-04 DIAGNOSIS — M170 Bilateral primary osteoarthritis of knee: Secondary | ICD-10-CM | POA: Diagnosis not present

## 2017-09-04 DIAGNOSIS — M069 Rheumatoid arthritis, unspecified: Secondary | ICD-10-CM

## 2017-09-04 DIAGNOSIS — Z79899 Other long term (current) drug therapy: Secondary | ICD-10-CM

## 2017-09-04 DIAGNOSIS — D631 Anemia in chronic kidney disease: Secondary | ICD-10-CM | POA: Diagnosis not present

## 2017-09-04 DIAGNOSIS — E875 Hyperkalemia: Secondary | ICD-10-CM | POA: Insufficient documentation

## 2017-09-04 DIAGNOSIS — K219 Gastro-esophageal reflux disease without esophagitis: Secondary | ICD-10-CM

## 2017-09-04 DIAGNOSIS — Z853 Personal history of malignant neoplasm of breast: Secondary | ICD-10-CM | POA: Diagnosis not present

## 2017-09-04 DIAGNOSIS — D500 Iron deficiency anemia secondary to blood loss (chronic): Secondary | ICD-10-CM

## 2017-09-04 DIAGNOSIS — K922 Gastrointestinal hemorrhage, unspecified: Secondary | ICD-10-CM | POA: Diagnosis not present

## 2017-09-04 DIAGNOSIS — Z87891 Personal history of nicotine dependence: Secondary | ICD-10-CM | POA: Diagnosis not present

## 2017-09-04 DIAGNOSIS — D638 Anemia in other chronic diseases classified elsewhere: Secondary | ICD-10-CM

## 2017-09-04 NOTE — Progress Notes (Signed)
Diagnosis Anemia of chronic disease - Plan: CBC with Differential/Platelet, Comprehensive metabolic panel, Lactate dehydrogenase, Ferritin, Vitamin B12, Haptoglobin, Folate, CBC with Differential/Platelet, Comprehensive metabolic panel, Lactate dehydrogenase, Ferritin, MM Digital Screening Unilat R  Staging Cancer Staging No matching staging information was found for the patient.  Assessment and Plan:  1.  Iron deficiency anemia.  Pt was followed by Dr. Talbert Cage.  She is suspected of chronic GI blood loss. Hospitalization for GIB in 09/2016; was found to have several bleeding lesions as well as AVMs. She has been seen at both [facility] and Dr. Laural Golden for GI evaluations.   She was last treated with IV iron in 02/2017.    Labs done 08/28/2017 reviewed with the pt and show white count 4.8 hemoglobin 8.7 platelets 234,000.  Creatinine 1.24, SPEP negative, ferritin 566 immunofixation shows polyclonal gammopathy.  Kappa/ lambda ratio normal. Hemoglobin is stable at 8.7 dating back to 2018.  She will have repeat labs in 3 months.  Will check haptoglobin,LDH,  B12, ferritin and repeat CBC and chemistries at that time.    2.  Left breast cancer.  She is 16+ years out from her breast cancer diagnosis.  She had a right diagnostic mammogram and ultrasound performed 04/02/2017 that was negative.  She is recommended for right screening mammogram in November 2019 and this is ordered today.  She will follow-up at that time to go over the results.  3.  Chronic GI bleed.  She was hospitalized  for GI bleed in June 2018.  She should follow-up with GI as recommended.  4.  Rheumatoid arthritis.  Patient is on Plaquenil.  She should continue to follow with rheumatology or PCP as directed.  5.  CKD.  Creatinine 1.24.  SPEP negative.  Immunofixation showed polyclonal gammopathy.  Kappa lambda light chains normal.  Chronic kidney disease may play a role in anemia.  May have to consider ESA support in the future.  6.   Hypertension.  Blood pressure 132/55.  Follow-up with PCP.   Interval History:   (Per Kirby Crigler, PA-C's note on 12/20/15)   Current Status:  Pt is seen today for follow-up.  She is here to go over labs.  She had mammogram and USN done in 03/2017.      Breast cancer [facility])    Initial Diagnosis    Breast cancer        Problem List Patient Active Problem List   Diagnosis Date Noted  . AKI (acute kidney injury) ([place]) [N17.9] 08/10/2017  . Hyperkalemia [E87.5] 08/10/2017  . Acute blood loss anemia [D62] 09/20/2016  . Ischemic cardiomyopathy [I25.5] 09/20/2016  . Chronic combined systolic and diastolic CHF (congestive heart failure) ([place]) [I50.42] 09/20/2016  . CKD (chronic kidney disease), stage IV ([place]) [N18.4] 09/20/2016  . History of breast cancer [Z85.3] 08/02/2016  . Primary osteoarthritis of both knees [M17.0] 08/02/2016  . Primary osteoarthritis of both feet [M19.071, M19.072] 08/02/2016  . History of anemia [Z86.2] 07/17/2016  . History of CHF (congestive heart failure) [Z86.79] 06/04/2016  . History of chronic kidney disease [Z87.448] 06/04/2016  . Symptomatic anemia [D64.9] 06/04/2016  . Elevated sedimentation rate [R70.0] 03/03/2016  . Idiopathic chronic gout of multiple sites without tophus [M1A.09X0] 03/03/2016  . Total knee replacement status, right [Z96.651] 03/03/2016  . High risk medication use [Z79.899] 03/03/2016  . Chronic kidney disease (CKD) stage G3b/A1, moderately decreased glomerular filtration rate (GFR) between 30-44 mL/min/1.73 square meter and albuminuria creatinine ratio less than 30 mg/g (HCC) [N18.3] 02/01/2016  .  CAD S/P remote PCI- no details [I25.10, Z98.61] 09/02/2014  . Cardiomyopathy, ischemic-EF 30-35% March 2015 [I25.5] 09/02/2014  . Diastolic dysfunction-grade 2 [I51.89] 09/02/2014  . CHF exacerbation ([place]) [I50.9] 08/28/2014  . Acute on chronic combined systolic and diastolic congestive heart failure ([place]) [I50.43] 08/28/2014  . Iron  deficiency anemia [D50.9] 08/20/2013  . Malnutrition of moderate degree ([place]) [E44.0] 07/21/2013  . PNA (pneumonia) [J18.9] 07/19/2013  . HCAP (healthcare-associated pneumonia) [J18.9] 07/17/2013  . Sepsis ([place]) [A41.9] 07/17/2013  . ARF (acute renal failure) ([place]) [N17.9] 07/17/2013  . Rheumatoid arthritis ([place]) [M06.9] 07/04/2013  . Hypokalemia [E87.6] 07/03/2013  . Chest pain [R07.9] 07/03/2013  . Biventricular ICD in place (MDT 2014) [Z95.810] 11/06/2012  . Anemia of chronic disease [D63.8]   . Breast cancer ([place]) [C50.919]   . Hypertension [I10]   . Dyslipidemia [E78.5] 05/24/2009  . Chronic systolic heart failure ([place]) [I50.22] 05/24/2009  . Primary osteoarthritis of both hands [M19.041, M19.042] 08/11/2007    Past Medical History Past Medical History:  Diagnosis Date  . Anemia    Hgb of 9-10  . Anemia of chronic disease    Hgb of 9-10 chronically; 06/2010: H&H-10.7/33.5, MCV-81, normal iron studies in 2010   . Arteriosclerotic cardiovascular disease (ASCVD)    Remote PTCA by patient report; LBBB; associated cardiomyopathy, presumed ischemic with EF 40-45% previously, 20% in 06/2009; h/o clinical congestive heart failure; negative stress nuclear in 2009 with inferoseptal and apical scar  . Automatic implantable cardioverter-defibrillator in situ   . Breast cancer ([place])    breast  . Chronic bronchitis ([place])   . Chronic renal disease, stage 3, moderately decreased glomerular filtration rate (GFR) between 30-59 mL/min/1.73 square meter (HCC) 02/01/2016  . Congestive heart failure (CHF) ([place])   . Elevated cholesterol   . Elevated sed rate   . GERD (gastroesophageal reflux disease)   . GI bleed   . Gout   . HOH (hard of hearing)   . Hyperlipidemia   . Hypertension   . Rheumatoid arthritis ([place])   . UTI (urinary tract infection)     Past Surgical History Past Surgical History:  Procedure Laterality Date  . BI-VENTRICULAR IMPLANTABLE CARDIOVERTER DEFIBRILLATOR N/A  07/28/2012   Procedure: BI-VENTRICULAR IMPLANTABLE CARDIOVERTER DEFIBRILLATOR  (CRT-D);  Surgeon: Evans Lance, MD;  Location: [facility] CATH LAB;  Service: Cardiovascular;  Laterality: N/A;  . BI-VENTRICULAR IMPLANTABLE CARDIOVERTER DEFIBRILLATOR  (CRT-D)  07/28/2012  . BREAST BIOPSY Bilateral   . CATARACT EXTRACTION W/ INTRAOCULAR LENS IMPLANT Left   . CATARACT EXTRACTION W/PHACO Right 09/15/2013   Procedure: CATARACT EXTRACTION PHACO AND INTRAOCULAR LENS PLACEMENT (IOC);  Surgeon: Elta Guadeloupe T. Gershon Crane, MD;  Location: [facility] ORS;  Service: Ophthalmology;  Laterality: Right;  CDE:  10.74  . COLONOSCOPY N/A 03/04/2015   Procedure: COLONOSCOPY;  Surgeon: Rogene Houston, MD;  Location: [facility] ENDO SUITE;  Service: Endoscopy;  Laterality: N/A;  10:50   . ESOPHAGOGASTRODUODENOSCOPY N/A 03/04/2015   Procedure: ESOPHAGOGASTRODUODENOSCOPY (EGD);  Surgeon: Rogene Houston, MD;  Location: [facility] ENDO SUITE;  Service: Endoscopy;  Laterality: N/A;  . ESOPHAGOGASTRODUODENOSCOPY N/A 09/21/2016   Procedure: ESOPHAGOGASTRODUODENOSCOPY (EGD);  Surgeon: Rogene Houston, MD;  Location: [facility] ENDO SU[facility];  Service: Endoscopy;  Laterality: N/A;  . GIVENS CAPSULE STUDY N/A 09/22/2016   Procedure: GIVENS CAPSULE STUDY;  Surgeon: Rogene Houston, MD;  Location: [facility] END[facility];  Service: Endoscopy;  Laterality: N/A;  . MASTECTOMY Left 1998  . TENDON TRANSFER Right 08/15/2017   Procedure: TENDON TRANSFER RIGHT WRIST EXTENSORS AS NEEDED,  ULNA RESECTION AND STABILIZATION;  Surgeon: Charlotte Crumb, MD;  Location: [place];  Service: Orthopedics;  Laterality: Right;  . TOTAL KNEE ARTHROPLASTY Right    Dr.Harrison  . TUBAL LIGATION      Family History Family History  Problem Relation Age of Onset  . Diabetes Father   . Pancreatic cancer Father   . Hypertension Brother      Social History  reports that she quit smoking about 10 years ago. Her smoking use included cigarettes. She started smoking about 61 years ago. She has a 7.50 pack-year smoking  history. Her smokeless tobacco use includes chew. She reports that she does not drink alcohol or use drugs.  Medications  Current Outpatient Medications:  .  acetaminophen (TYLENOL) 500 MG tablet, Take 500 mg by mouth every 6 (six) hours as needed (FOR PAIN/HEADACHES)., Disp: , Rfl:  .  allopurinol (ZYLOPRIM) 100 MG tablet, TAKE 1 TABLET (100 MG TOTAL) BY MOUTH DAILY., Disp: 90 tablet, Rfl: 0 .  carvedilol (COREG) 6.25 MG tablet, TAKE 1 TABLET BY MOUTH TWICE A DAY WITH MEALS, Disp: 60 tablet, Rfl: 3 .  colchicine (COLCRYS) 0.6 MG tablet, Take 0.6 mg by mouth daily., Disp: , Rfl:  .  diphenhydrAMINE (BENADRYL) 25 mg capsule, Take 25 mg by mouth 2 (two) times daily as needed for itching or allergies. , Disp: , Rfl:  .  furosemide (LASIX) 40 MG tablet, Take 40 mg by mouth daily. , Disp: , Rfl:  .  hydroxychloroquine (PLAQUENIL) 200 MG tablet, TAKE 1 TABLET (200 MG TOTAL) BY MOUTH DAILY. MONDAY THRU FRIDAY, Disp: 60 tablet, Rfl: 0 .  lovastatin (MEVACOR) 40 MG tablet, TAKE 1 TABLET (40 MG TOTAL) BY MOUTH AT BEDTIME., Disp: 30 tablet, Rfl: 6 .  Magnesium 500 MG CAPS, Take 500 mg by mouth at bedtime., Disp: , Rfl:  .  pantoprazole (PROTONIX) 20 MG tablet, Take 1 tablet (20 mg total) by mouth daily., Disp: 90 tablet, Rfl: 3  Allergies Macrobid [nitrofurantoin macrocrystal]  Review of Systems Review of Systems - Oncology ROS as per HPI otherwise 12 point ROS is negative.   Physical Exam  Vitals Wt Readings from Last 3 Encounters:  09/04/17 144 lb 6.4 oz (65.5 kg)  08/28/17 142 lb (64.4 kg)  08/15/17 142 lb (64.4 kg)   Temp Readings from Last 3 Encounters:  09/04/17 98.5 F (36.9 C) (Oral)  08/15/17 98 F (36.7 C)  08/10/17 99 F (37.2 C) (Oral)   BP Readings from Last 3 Encounters:  09/04/17 (!) 132/55  08/28/17 116/64  08/15/17 (!) 149/72   Pulse Readings from Last 3 Encounters:  09/04/17 85  08/28/17 81  08/15/17 72    Constitutional: Well-developed, well-nourished, and  in no distress.   HENT: Head: Normocephalic and atraumatic.  Mouth/Throat: No oropharyngeal exudate. Mucosa moist. Eyes: Pupils are equal, round, and reactive to light. Conjunctivae are normal. No scleral icterus.  Neck: Normal range of motion. Neck supple. No JVD present.  Cardiovascular: Normal rate, regular rhythm and normal heart sounds.  Exam reveals no gallop and no friction rub.   No murmur heard. Pulmonary/Chest: Effort normal and breath sounds normal. No respiratory distress. No wheezes.No rales.  Abdominal: Soft. Bowel sounds are normal. No distension. There is no tenderness. There is no guarding.  Musculoskeletal: right arm brace noted.   Lymphadenopathy: No cervical, axillary or supraclavicular adenopathy.  Neurological: Alert and oriented to person, place, and time. No cranial nerve deficit.  Skin: Skin is warm and dry. No rash  noted. No erythema. No pallor.  Psychiatric: Affect and judgment normal.  Bilateral breast exam:  Chaperone present.  Left mastectomy.  Right breast shows no palpable abnormalities.    Labs No visits with results within 3 Day(s) from this visit.  Latest known visit with results is:  Appointment on 08/28/2017  Component Date Value Ref Range Status  . WBC 08/28/2017 4.8  4.0 - 10.5 K/uL Final  . RBC 08/28/2017 3.18* 3.87 - 5.11 MIL/uL Final  . Hemoglobin 08/28/2017 8.7* 12.0 - 15.0 g/dL Final  . HCT 08/28/2017 28.4* 36.0 - 46.0 % Final  . MCV 08/28/2017 89.3  78.0 - 100.0 fL Final  . MCH 08/28/2017 27.4  26.0 - 34.0 pg Final  . MCHC 08/28/2017 30.6  30.0 - 36.0 g/dL Final  . RDW 08/28/2017 17.2* 11.5 - 15.5 % Final  . Platelets 08/28/2017 234  150 - 400 K/uL Final  . Neutrophils Relative % 08/28/2017 54  % Final  . Neutro Abs 08/28/2017 2.6  1.7 - 7.7 K/uL Final  . Lymphocytes Relative 08/28/2017 29  % Final  . Lymphs Abs 08/28/2017 1.4  0.7 - 4.0 K/uL Final  . Monocytes Relative 08/28/2017 13  % Final  . Monocytes Absolute 08/28/2017 0.6  0.1 -  1.0 K/uL Final  . Eosinophils Relative 08/28/2017 3  % Final  . Eosinophils Absolute 08/28/2017 0.2  0.0 - 0.7 K/uL Final  . Basophils Relative 08/28/2017 1  % Final  . Basophils Absolute 08/28/2017 0.0  0.0 - 0.1 K/uL Final   Performed at [facility], [address], [place], [place] [address]  . Sodium 08/28/2017 138  135 - 145 mmol/L Final  . Potassium 08/28/2017 3.6  3.5 - 5.1 mmol/L Final  . Chloride 08/28/2017 100* 101 - 111 mmol/L Final  . CO2 08/28/2017 30  22 - 32 mmol/L Final  . Glucose, Bld 08/28/2017 112* 65 - 99 mg/dL Final  . BUN 08/28/2017 30* 6 - 20 mg/dL Final  . Creatinine, Ser 08/28/2017 1.24* 0.44 - 1.00 mg/dL Final  . Calcium 08/28/2017 9.0  8.9 - 10.3 mg/dL Final  . Total Protein 08/28/2017 7.7  6.5 - 8.1 g/dL Final  . Albumin 08/28/2017 3.3* 3.5 - 5.0 g/dL Final  . AST 08/28/2017 22  15 - 41 U/L Final  . ALT 08/28/2017 16  14 - 54 U/L Final  . Alkaline Phosphatase 08/28/2017 106  38 - 126 U/L Final  . Total Bilirubin 08/28/2017 0.5  0.3 - 1.2 mg/dL Final  . GFR calc non Af Amer 08/28/2017 41* >60 mL/min Final  . GFR calc Af Amer 08/28/2017 47* >60 mL/min Final   Comment: (NOTE) The eGFR has been calculated using the CKD EPI equation. This calculation has not been validated in all clinical situations. eGFR's persistently <60 mL/min signify possible Chronic Kidney Disease.   Georgiann Hahn gap 08/28/2017 8  5 - 15 Final   Performed at [facility], [address], [place], [place] [address]  . Total Protein ELP 08/28/2017 7.3  6.0 - 8.5 g/dL Final  . IgG (Immunoglobin G), Serum 08/28/2017 1,379  700 - 1,600 mg/dL Final  . IgA 08/28/2017 393  64 - 422 mg/dL Final  . IgM (Immunoglobulin M), Srm 08/28/2017 218* 26 - 217 mg/dL Final   Comment: (NOTE) Performed At: [facility] [address] [place], [place] [id number] Rush Farmer MD EO:[id number]   . Immunofixation Result, Serum 08/28/2017 Comment   Corrected   Comment: (NOTE) An apparent polyclonal  gammopathy:  IgA and IgM. Kappa and lambda typing appear increased. Performed at [facility], [address] [place], [place] [address]   . IgG (Immunoglobin G), Serum 08/28/2017 1,361  700 - 1,600 mg/dL Final  . IgA 08/28/2017 404  64 - 422 mg/dL Final  . IgM (Immunoglobulin M), Srm 08/28/2017 225* 26 - 217 mg/dL Final   Comment: (NOTE) Performed At: [facility] [place], [place] [id number] Rush Farmer MD XB:[id number] Performed at [facility], [address] [place], [place] [address]   . Total Protein ELP 08/28/2017 7.0  6.0 - 8.5 g/dL Final  . Albumin ELP 08/28/2017 3.2  2.9 - 4.4 g/dL Final  . Alpha-1-Globulin 08/28/2017 0.3  0.0 - 0.4 g/dL Final  . Alpha-2-Globulin 08/28/2017 1.1* 0.4 - 1.0 g/dL Final  . Beta Globulin 08/28/2017 1.0  0.7 - 1.3 g/dL Final  . Gamma Globulin 08/28/2017 1.4  0.4 - 1.8 g/dL Final  . M-Spike, % 08/28/2017 Not Observed  Not Observed g/dL Final  . SPE Interp. 08/28/2017 Comment   Final   Comment: (NOTE) The SPE pattern is suggestive of a subacute inflammatory response. This condition represents an intermediate stage between two possible courses for acute inflammation: total convalescense with a return to normal, or the onset of a chronic inflammatory condition. Performed At: [facility] [place], [place] [id number] Rush Farmer MD QQ:[id number]   . Comment 08/28/2017 Comment   Final   Comment: (NOTE) Protein electrophoresis scan will follow via computer, mail, or courier delivery.   [name] GLOBULIN, TOTAL 08/28/2017 3.8  2.2 - 3.9 g/dL Corrected  . A/G Ratio 08/28/2017 0.8  0.7 - 1.7 Corrected   Performed at [facility], [address], [place], [place] [address]  . Kappa free light chain 08/28/2017 43.3* 3.3 - 19.4 mg/L Final  . Lamda free light chains 08/28/2017 37.4* 5.7 - 26.3 mg/L Final  . Kappa, lamda light chain ratio 08/28/2017 1.16  0.26 - 1.65 Final   Comment: (NOTE) Performed At: [facility] [place], [place] [id number] Rush Farmer MD TK:[id number] Performed at [facility], [address] [place], [place] [address]   . Ferritin 08/28/2017 566* 11 - 307 ng/mL Final   Performed at [facility], [place] [address] [place], [place] [address]  . Iron 08/28/2017 67  28 - 170 ug/dL Final  . TIBC 08/28/2017 188* 250 - 450 ug/dL Final  . Saturation Ratios 08/28/2017 36* 10.4 - 31.8 % Final  . UIBC 08/28/2017 121  ug/dL Final   Performed at [facility], [place] [address] [place], [place] [address]     Pathology Orders Placed This Encounter  Procedures  . MM Digital Screening Unilat R    Standing Status:   Future    Standing Expiration Date:   09/04/2018    Order Specific Question:   Reason for Exam (SYMPTOM  OR DIAGNOSIS REQUIRED)    Answer:   left breast cancer    Order Specific Question:   Preferred imaging location?    Answer:   [facility]  . CBC with Differential/Platelet    Standing Status:   Future    Standing Expiration Date:   09/05/2018  . Comprehensive metabolic panel    Standing Status:   Future    Standing Expiration Date:   09/05/2018  . Lactate dehydrogenase    Standing Status:   Future    Standing Expiration Date:   09/05/2018  . Ferritin  Standing Status:   Future    Standing Expiration Date:   09/05/2018  . Vitamin B12    Standing Status:   Future    Standing Expiration Date:   09/05/2018  . Haptoglobin    Standing Status:   Future    Standing Expiration Date:   09/05/2018  . Folate    Standing Status:   Future    Standing Expiration Date:   09/05/2018  . CBC with Differential/Platelet    Standing Status:   Future    Standing Expiration Date:   09/05/2018  . Comprehensive metabolic panel    Standing Status:   Future    Standing Expiration Date:   09/05/2018  . Lactate dehydrogenase    Standing Status:   Future    Standing Expiration Date:   09/05/2018  . Ferritin    Standing Status:   Future     Standing Expiration Date:   09/05/2018       Zoila Shutter MD

## 2017-09-09 ENCOUNTER — Telehealth: Payer: Self-pay

## 2017-09-09 ENCOUNTER — Ambulatory Visit (INDEPENDENT_AMBULATORY_CARE_PROVIDER_SITE_OTHER): Payer: Medicare HMO

## 2017-09-09 DIAGNOSIS — I5022 Chronic systolic (congestive) heart failure: Secondary | ICD-10-CM | POA: Diagnosis not present

## 2017-09-09 DIAGNOSIS — I5042 Chronic combined systolic (congestive) and diastolic (congestive) heart failure: Secondary | ICD-10-CM | POA: Diagnosis not present

## 2017-09-09 DIAGNOSIS — M069 Rheumatoid arthritis, unspecified: Secondary | ICD-10-CM | POA: Diagnosis not present

## 2017-09-09 DIAGNOSIS — Z95810 Presence of automatic (implantable) cardiac defibrillator: Secondary | ICD-10-CM | POA: Diagnosis not present

## 2017-09-09 DIAGNOSIS — I10 Essential (primary) hypertension: Secondary | ICD-10-CM | POA: Diagnosis not present

## 2017-09-09 NOTE — Telephone Encounter (Signed)
Remote ICM transmission received.  Attempted call to nephew [name] per DPR. Left detailed message regarding transmission and to return call for any fluid symptoms or questions.  Next ICM transmission will be 09/19/2017 to recheck fluid levels.

## 2017-09-09 NOTE — Progress Notes (Signed)
EPIC Encounter for ICM Monitoring  Patient Name: [name] is a 77 y.o. female Date: 09/09/2017 Primary Care Physican: [name], MD Primary Cardiologist:Koneswaran/Lawrence NP Electrophysiologist: Lovena Le Dry Weight:unknown Bi-V Pacing: 94.9%       Attempted call to [name], nephew. Attempted call to patient and unable to reach.  Left detailed message, per DPR, regarding transmission.  Transmission reviewed.    Thoracic impedance abnormal suggesting fluid accumulation since 08/26/2017.  Prescribed dosage: Patient reported at 08/28/2017 office visit with Dr [name] that her PCP told her to resume taking Lasix.  Labs: 08/28/2017 Creatinine 1.24, BUN 30, Potassium 3.6, Sodium 138, EGFR 41-47 08/15/2017  Potassium 3.6, Sodium 142 08/10/2017 Creatinine 2.19, BUN 61, Potassium 4.5, Sodium 142, EGFR 20-24  08/09/2017 Creatinine 2.88, BUN 71, Potassium 5.4, Sodium 139, EGFR 15-17, 10:09 PM results 08/09/2017 Creatinine 2.73, BUN 65, Potassium 6.1, Sodium 135, EGFR 16-18, 03:06 PM results   05/29/2017 Creatinine 1.81, BUN 45, Potassium 4.5, Sodium 138, EGFR 26-30 03/05/2017 Creatinine 2.27, BUN 47, Potassium 5.0, Sodium 137, EGFR 20-23 01/15/2017 Creatinine 1.56, BUN 37, Potassium 4.4, Sodium 139, EGFR 31-36 11/29/2016 Creatinine 1.68, BUN 30, Potassium 4.1, Sodium 138, EGFR 28-33 10/23/2016 Creatinine 2.08 BUN 31, Potassium 5.3, Sodium 136, EGFR 22-25 10/15/2016 Creatinine 0.99, BUN 18, Potassium 3.0, Sodium 136, EGFR 53->60 10/08/2016 Creatinine 0.95, BUN 16, Potassium 3.1, Sodium 140, EGFR 56->60 09/23/2016 Creatinine 0.74, BUN 13, Potassium 4.0, Sodium 141, EGFR >60 09/22/2016 Creatinine 0.90, BUN 15, Potassium 3.9, Sodium 141, EGFR >60  09/21/2016 Creatinine 1.05, BUN 26, Potassium 3.7, Sodium 140, EGFR 50-57  09/20/2016 Creatinine 1.53, BUN 37, Potassium 3.9, Sodium 140, EGFR 31-36 09/05/2016 Creatinine 1.25, BUN 29, Potassium 4.0, Sodium 137, EGFR 40-47    08/08/2016 Creatinine 1.10, BUN 24, Potassium 3.6, Sodium 136, EGFR 47-55 07/19/2016 Creatinine 1.26, BUN 29, Potassium 3.6, Sodium 139, EGFR 40-46 06/22/2016 Creatinine 1.44, BUN 37, Potassium 3.7, Sodium 140, EGFR 34-39 06/01/2016 Creatinine 1.68, BUN 43, Potassium 4.7, Sodium 132, EGFR 28-33  Recommendations: Left voice mail with ICM number and encouraged to call if experiencing any fluid symptoms.  Follow-up plan: ICM clinic phone appointment on 09/19/2017 to recheck fluid levels.  Copy of ICM check sent to Dr. [name] and Dr. Lovena Le.   3 month ICM trend: 09/09/2017    1 Year ICM trend:       [name], RN 09/09/2017 2:56 PM

## 2017-09-10 LAB — CUP PACEART REMOTE DEVICE CHECK
Battery Remaining Longevity: 42 mo
Battery Voltage: 2.96 V
Brady Statistic AP VP Percent: 13.97 %
Brady Statistic AS VP Percent: 83.59 %
Brady Statistic RA Percent Paced: 14.45 %
Brady Statistic RV Percent Paced: 8.53 %
HighPow Impedance: 75 Ohm
Implantable Lead Implant Date: 20140407
Implantable Lead Location: 753858
Implantable Lead Location: 753860
Implantable Lead Model: 4194
Implantable Lead Model: 5076
Lead Channel Impedance Value: 342 Ohm
Lead Channel Impedance Value: 380 Ohm
Lead Channel Impedance Value: 589 Ohm
Lead Channel Impedance Value: 779 Ohm
Lead Channel Pacing Threshold Amplitude: 1.125 V
Lead Channel Pacing Threshold Pulse Width: 0.4 ms
Lead Channel Pacing Threshold Pulse Width: 0.4 ms
Lead Channel Pacing Threshold Pulse Width: 0.5 ms
Lead Channel Sensing Intrinsic Amplitude: 1.875 mV
Lead Channel Sensing Intrinsic Amplitude: 1.875 mV
Lead Channel Setting Pacing Amplitude: 1.75 V
Lead Channel Setting Pacing Amplitude: 2 V
Lead Channel Setting Pacing Pulse Width: 0.4 ms
MDC IDC LEAD IMPLANT DT: 20140407
MDC IDC LEAD IMPLANT DT: 20140407
MDC IDC LEAD LOCATION: 753859
MDC IDC MSMT LEADCHNL LV IMPEDANCE VALUE: 342 Ohm
MDC IDC MSMT LEADCHNL LV PACING THRESHOLD AMPLITUDE: 0.625 V
MDC IDC MSMT LEADCHNL RA PACING THRESHOLD AMPLITUDE: 0.625 V
MDC IDC MSMT LEADCHNL RV IMPEDANCE VALUE: 456 Ohm
MDC IDC MSMT LEADCHNL RV SENSING INTR AMPL: 30.25 mV
MDC IDC MSMT LEADCHNL RV SENSING INTR AMPL: 30.25 mV
MDC IDC PG IMPLANT DT: 20140407
MDC IDC SESS DTM: 20190506052207
MDC IDC SET LEADCHNL LV PACING PULSEWIDTH: 0.5 ms
MDC IDC SET LEADCHNL RV PACING AMPLITUDE: 2.5 V
MDC IDC SET LEADCHNL RV SENSING SENSITIVITY: 0.3 mV
MDC IDC STAT BRADY AP VS PERCENT: 0.77 %
MDC IDC STAT BRADY AS VS PERCENT: 1.67 %

## 2017-09-11 ENCOUNTER — Encounter: Payer: Self-pay | Admitting: Rheumatology

## 2017-09-11 ENCOUNTER — Ambulatory Visit (INDEPENDENT_AMBULATORY_CARE_PROVIDER_SITE_OTHER): Payer: Medicare HMO | Admitting: Rheumatology

## 2017-09-11 VITALS — BP 115/73 | HR 75 | Resp 16 | Ht 61.42 in | Wt 147.0 lb

## 2017-09-11 DIAGNOSIS — S66811D Strain of other specified muscles, fascia and tendons at wrist and hand level, right hand, subsequent encounter: Secondary | ICD-10-CM

## 2017-09-11 DIAGNOSIS — M0579 Rheumatoid arthritis with rheumatoid factor of multiple sites without organ or systems involvement: Secondary | ICD-10-CM | POA: Diagnosis not present

## 2017-09-11 DIAGNOSIS — M19071 Primary osteoarthritis, right ankle and foot: Secondary | ICD-10-CM

## 2017-09-11 DIAGNOSIS — R700 Elevated erythrocyte sedimentation rate: Secondary | ICD-10-CM

## 2017-09-11 DIAGNOSIS — M1A09X0 Idiopathic chronic gout, multiple sites, without tophus (tophi): Secondary | ICD-10-CM | POA: Diagnosis not present

## 2017-09-11 DIAGNOSIS — Z79899 Other long term (current) drug therapy: Secondary | ICD-10-CM

## 2017-09-11 DIAGNOSIS — N1832 Chronic kidney disease, stage 3b: Secondary | ICD-10-CM

## 2017-09-11 DIAGNOSIS — N183 Chronic kidney disease, stage 3 (moderate): Secondary | ICD-10-CM

## 2017-09-11 DIAGNOSIS — E785 Hyperlipidemia, unspecified: Secondary | ICD-10-CM

## 2017-09-11 DIAGNOSIS — Z853 Personal history of malignant neoplasm of breast: Secondary | ICD-10-CM | POA: Diagnosis not present

## 2017-09-11 DIAGNOSIS — I5022 Chronic systolic (congestive) heart failure: Secondary | ICD-10-CM

## 2017-09-11 DIAGNOSIS — M1712 Unilateral primary osteoarthritis, left knee: Secondary | ICD-10-CM | POA: Diagnosis not present

## 2017-09-11 DIAGNOSIS — M19072 Primary osteoarthritis, left ankle and foot: Secondary | ICD-10-CM

## 2017-09-11 DIAGNOSIS — Z96651 Presence of right artificial knee joint: Secondary | ICD-10-CM

## 2017-09-11 DIAGNOSIS — Z862 Personal history of diseases of the blood and blood-forming organs and certain disorders involving the immune mechanism: Secondary | ICD-10-CM

## 2017-09-19 ENCOUNTER — Ambulatory Visit (INDEPENDENT_AMBULATORY_CARE_PROVIDER_SITE_OTHER): Payer: Self-pay

## 2017-09-19 DIAGNOSIS — Z95810 Presence of automatic (implantable) cardiac defibrillator: Secondary | ICD-10-CM

## 2017-09-19 DIAGNOSIS — I5042 Chronic combined systolic (congestive) and diastolic (congestive) heart failure: Secondary | ICD-10-CM

## 2017-09-19 NOTE — Progress Notes (Signed)
EPIC Encounter for ICM Monitoring  Patient Name: [name] is a 77 y.o. female Date: 09/19/2017 Primary Care Physican: [name], MD Primary Cardiologist:Koneswaran/Lawrence NP Electrophysiologist: Lovena Le Dry Weight:unknown Bi-V Pacing: 95%      Call to [name], nephew.  Heart Failure questions reviewed, pt asymptomatic.   Thoracic impedance returned to normal since last ICM remote transmission on 09/09/2017.  Prescribed dosage: Furosemide 40 mg 1 tablet daily.  Labs: 08/28/2017 Creatinine 1.24, BUN 30, Potassium 3.6, Sodium 138, EGFR 41-47 08/15/2017 Potassium 3.6, Sodium142 08/10/2017 Creatinine2.19, BUN61, Potassium4.5, [id number], EGFR20-24  08/09/2017 Creatinine2.88, BUN71, Potassium5.4, Sodium139, FBPF41-06, 10:09 PM results 08/09/2017 Creatinine2.73, BUN65, Potassium6.1, Sodium135, HHIZ60-76, 03:06 PM results 05/29/2017 Creatinine 1.81, BUN 45, Potassium 4.5, Sodium 138, EGFR 26-30 03/05/2017 Creatinine 2.27, BUN 47, Potassium 5.0, Sodium 137, EGFR 20-23 01/15/2017 Creatinine 1.56, BUN 37, Potassium 4.4, Sodium 139, EGFR 31-36 11/29/2016 Creatinine 1.68, BUN 30, Potassium 4.1, Sodium 138, EGFR 28-33 10/23/2016 Creatinine 2.08 BUN 31, Potassium 5.3, Sodium 136, EGFR 22-25 10/15/2016 Creatinine 0.99, BUN 18, Potassium 3.0, Sodium 136, EGFR 53->60 10/08/2016 Creatinine 0.95, BUN 16, Potassium 3.1, Sodium 140, EGFR 56->60 09/23/2016 Creatinine 0.74, BUN 13, Potassium 4.0, Sodium 141, EGFR >60 09/22/2016 Creatinine 0.90, BUN 15, Potassium 3.9, Sodium 141, EGFR >60  09/21/2016 Creatinine 1.05, BUN 26, Potassium 3.7, Sodium 140, EGFR 50-57  09/20/2016 Creatinine 1.53, BUN 37, Potassium 3.9, Sodium 140, EGFR 31-36 09/05/2016 Creatinine 1.25, BUN 29, Potassium 4.0, Sodium 137, EGFR 40-47  08/08/2016 Creatinine 1.10, BUN 24, Potassium 3.6, Sodium 136, EGFR 47-55 07/19/2016 Creatinine 1.26, BUN 29, Potassium 3.6, Sodium 139, EGFR  40-46 06/22/2016 Creatinine 1.44, BUN 37, Potassium 3.7, Sodium 140, EGFR 34-39 06/01/2016 Creatinine 1.68, BUN 43, Potassium 4.7, Sodium 132, EGFR 28-33  Recommendations: No changes.   Encouraged to call for fluid symptoms.  Follow-up plan: ICM clinic phone appointment on 10/10/2017.  Office appointment scheduled 11/28/2017 with Dr. [name].  Copy of ICM check sent to Dr. Lovena Le.   3 month ICM trend: 09/19/2017    1 Year ICM trend:       [name], RN 09/19/2017 11:18 AM

## 2017-09-30 DIAGNOSIS — S66811A Strain of other specified muscles, fascia and tendons at wrist and hand level, right hand, initial encounter: Secondary | ICD-10-CM | POA: Diagnosis not present

## 2017-09-30 DIAGNOSIS — M25531 Pain in right wrist: Secondary | ICD-10-CM | POA: Diagnosis not present

## 2017-09-30 DIAGNOSIS — M25431 Effusion, right wrist: Secondary | ICD-10-CM | POA: Diagnosis not present

## 2017-09-30 DIAGNOSIS — S66811D Strain of other specified muscles, fascia and tendons at wrist and hand level, right hand, subsequent encounter: Secondary | ICD-10-CM | POA: Diagnosis not present

## 2017-10-10 ENCOUNTER — Ambulatory Visit (INDEPENDENT_AMBULATORY_CARE_PROVIDER_SITE_OTHER): Payer: Medicare HMO

## 2017-10-10 DIAGNOSIS — I5042 Chronic combined systolic (congestive) and diastolic (congestive) heart failure: Secondary | ICD-10-CM

## 2017-10-10 DIAGNOSIS — Z95810 Presence of automatic (implantable) cardiac defibrillator: Secondary | ICD-10-CM

## 2017-10-11 NOTE — Progress Notes (Signed)
EPIC Encounter for ICM Monitoring  Patient Name: [name] is a 77 y.o. female Date: 10/11/2017 Primary Care Physican: [name], MD Primary Cardiologist:Koneswaran/Lawrence NP Electrophysiologist: Lovena Le Dry Weight:unknown Bi-V Pacing: 93.9%                                                          Call to [name], nephew. Heart Failure questions reviewed, pt asymptomatic.   Thoracic impedance normal.  Prescribed dosage: Furosemide 40 mg 1 tablet daily.  Labs: 08/28/2017 Creatinine 1.24, BUN 30, Potassium 3.6, Sodium 138, EGFR 41-47 08/15/2017 Potassium 3.6, Sodium142 08/10/2017 Creatinine2.19, BUN61, Potassium4.5, [id number], EGFR20-24  08/09/2017 Creatinine2.88, BUN71, Potassium5.4, Sodium139, [id number], 10:09 PM results 08/09/2017 Creatinine2.73, BUN65, Potassium6.1, Sodium135, [id number], 03:06 PM results 05/29/2017 Creatinine 1.81, BUN 45, Potassium 4.5, Sodium 138, EGFR 26-30 03/05/2017 Creatinine 2.27, BUN 47, Potassium 5.0, Sodium 137, EGFR 20-23 01/15/2017 Creatinine 1.56, BUN 37, Potassium 4.4, Sodium 139, EGFR 31-36 11/29/2016 Creatinine 1.68, BUN 30, Potassium 4.1, Sodium 138, EGFR 28-33 10/23/2016 Creatinine 2.08 BUN 31, Potassium 5.3, Sodium 136, EGFR 22-25 10/15/2016 Creatinine 0.99, BUN 18, Potassium 3.0, Sodium 136, EGFR 53->60 10/08/2016 Creatinine 0.95, BUN 16, Potassium 3.1, Sodium 140, EGFR 56->60 09/23/2016 Creatinine 0.74, BUN 13, Potassium 4.0, Sodium 141, EGFR >60 09/22/2016 Creatinine 0.90, BUN 15, Potassium 3.9, Sodium 141, EGFR >60  09/21/2016 Creatinine 1.05, BUN 26, Potassium 3.7, Sodium 140, EGFR 50-57  09/20/2016 Creatinine 1.53, BUN 37, Potassium 3.9, Sodium 140, EGFR 31-36 09/05/2016 Creatinine 1.25, BUN 29, Potassium 4.0, Sodium 137, EGFR 40-47  08/08/2016 Creatinine 1.10, BUN 24, Potassium 3.6, Sodium 136, EGFR 47-55 07/19/2016 Creatinine 1.26, BUN 29, Potassium 3.6, Sodium 139, EGFR 40-46 06/22/2016  Creatinine 1.44, BUN 37, Potassium 3.7, Sodium 140, EGFR 34-39 06/01/2016 Creatinine 1.68, BUN 43, Potassium 4.7, Sodium 132, EGFR 28-33  Recommendations: No changes.  Encouraged to call for fluid symptoms.  Follow-up plan: ICM clinic phone appointment on 11/11/2017.  Office appointment scheduled 11/28/2017 with Dr. [name].  Copy of ICM check sent to Dr. Lovena Le.   3 month ICM trend: 10/10/2017    1 Year ICM trend:       [name], RN 10/11/2017 11:13 AM

## 2017-10-16 ENCOUNTER — Encounter: Payer: Self-pay | Admitting: Cardiovascular Disease

## 2017-11-11 ENCOUNTER — Ambulatory Visit (INDEPENDENT_AMBULATORY_CARE_PROVIDER_SITE_OTHER): Payer: Medicare HMO

## 2017-11-11 DIAGNOSIS — I5042 Chronic combined systolic (congestive) and diastolic (congestive) heart failure: Secondary | ICD-10-CM

## 2017-11-11 DIAGNOSIS — Z95810 Presence of automatic (implantable) cardiac defibrillator: Secondary | ICD-10-CM

## 2017-11-12 ENCOUNTER — Telehealth: Payer: Self-pay

## 2017-11-12 NOTE — Progress Notes (Signed)
EPIC Encounter for ICM Monitoring  Patient Name: [name] is a 77 y.o. female Date: 11/12/2017 Primary Care Physican: [name], MD Primary Cardiologist:Koneswaran/Lawrence NP Electrophysiologist: Lovena Le Dry Weight:unknown Bi-V Pacing: 92.5%      Call to [name], nephew and unable to reach.  Transmission reviewed   Thoracic impedance normal.  Prescribed dosage: Furosemide 40 mg 1 tablet daily.  Labs: 08/28/2017 Creatinine 1.24, BUN 30, Potassium 3.6, Sodium 138, EGFR 41-47 08/15/2017 Potassium 3.6, Sodium142 08/10/2017 Creatinine2.19, BUN61, Potassium4.5, [id number], EGFR20-24  08/09/2017 Creatinine2.88, BUN71, Potassium5.4, Sodium139, WVTV15-04, 10:09 PM results 08/09/2017 Creatinine2.73, BUN65, Potassium6.1, Sodium135, [id number], 03:06 PM results 05/29/2017 Creatinine 1.81, BUN 45, Potassium 4.5, Sodium 138, EGFR 26-30 03/05/2017 Creatinine 2.27, BUN 47, Potassium 5.0, Sodium 137, EGFR 20-23 01/15/2017 Creatinine 1.56, BUN 37, Potassium 4.4, Sodium 139, EGFR 31-36 11/29/2016 Creatinine 1.68, BUN 30, Potassium 4.1, Sodium 138, EGFR 28-33 10/23/2016 Creatinine 2.08 BUN 31, Potassium 5.3, Sodium 136, EGFR 22-25 10/15/2016 Creatinine 0.99, BUN 18, Potassium 3.0, Sodium 136, EGFR 53->60 10/08/2016 Creatinine 0.95, BUN 16, Potassium 3.1, Sodium 140, EGFR 56->60 09/23/2016 Creatinine 0.74, BUN 13, Potassium 4.0, Sodium 141, EGFR >60  Recommendations: Left voice mail with ICM number and encouraged to call if experiencing any fluid symptoms.  Follow-up plan: ICM clinic phone appointment on 12/12/2017.    Copy of ICM check sent to Dr. Lovena Le.   3 month ICM trend: 11/11/2017    1 Year ICM trend:       [name], RN 11/12/2017 10:16 AM

## 2017-11-12 NOTE — Telephone Encounter (Signed)
Remote ICM transmission received.  Attempted call to nephew and left detailed message, per DPR, regarding transmission and next ICM scheduled for 12/12/2017.  Advised to return call for any fluid symptoms or questions.

## 2017-11-25 ENCOUNTER — Ambulatory Visit (INDEPENDENT_AMBULATORY_CARE_PROVIDER_SITE_OTHER): Payer: Medicare HMO | Admitting: *Deleted

## 2017-11-25 ENCOUNTER — Other Ambulatory Visit: Payer: Self-pay | Admitting: Rheumatology

## 2017-11-25 DIAGNOSIS — I5042 Chronic combined systolic (congestive) and diastolic (congestive) heart failure: Secondary | ICD-10-CM

## 2017-11-25 DIAGNOSIS — I255 Ischemic cardiomyopathy: Secondary | ICD-10-CM

## 2017-11-25 DIAGNOSIS — M0579 Rheumatoid arthritis with rheumatoid factor of multiple sites without organ or systems involvement: Secondary | ICD-10-CM

## 2017-11-25 NOTE — Telephone Encounter (Signed)
Last visit: 09/11/2017 Next visit: 02/13/2018 Labs: 08/28/2017 creat 1.24 previous 2.19 GFR 47 previous 24 Eye exam: 01/08/2017  Okay to refill plaquenil?

## 2017-11-25 NOTE — Telephone Encounter (Signed)
ok 

## 2017-11-27 ENCOUNTER — Encounter: Payer: Self-pay | Admitting: Student

## 2017-11-27 ENCOUNTER — Telehealth: Payer: Self-pay | Admitting: Rheumatology

## 2017-11-27 ENCOUNTER — Ambulatory Visit (INDEPENDENT_AMBULATORY_CARE_PROVIDER_SITE_OTHER): Payer: Medicare HMO | Admitting: Student

## 2017-11-27 VITALS — BP 120/56 | HR 72 | Ht 65.0 in | Wt 142.6 lb

## 2017-11-27 DIAGNOSIS — I5042 Chronic combined systolic (congestive) and diastolic (congestive) heart failure: Secondary | ICD-10-CM | POA: Diagnosis not present

## 2017-11-27 DIAGNOSIS — Z95810 Presence of automatic (implantable) cardiac defibrillator: Secondary | ICD-10-CM

## 2017-11-27 DIAGNOSIS — I255 Ischemic cardiomyopathy: Secondary | ICD-10-CM

## 2017-11-27 DIAGNOSIS — N184 Chronic kidney disease, stage 4 (severe): Secondary | ICD-10-CM | POA: Diagnosis not present

## 2017-11-27 DIAGNOSIS — I10 Essential (primary) hypertension: Secondary | ICD-10-CM | POA: Diagnosis not present

## 2017-11-27 NOTE — Progress Notes (Signed)
Remote ICD transmission.   

## 2017-11-27 NOTE — Telephone Encounter (Signed)
Spoke to [facility], were patient picked up her medication. They said the copay for that was $31.64, which was a 56 day supply. Most grant [facility] are closed at this time. Left message for [name] to call back to discuss.  10:28 AM [name], CPhT

## 2017-11-27 NOTE — Patient Instructions (Signed)
Medication Instructions:  Your physician recommends that you continue on your current medications as directed. Please refer to the Current Medication list given to you today.   Labwork: Your physician recommends that you return for lab work please have BMET on 12/05/17.    Testing/Procedures: NONE   Follow-Up: Your physician recommends that you schedule a follow-up appointment in: 4 Months with Dr. [name].    Any Other Special Instructions Will Be Listed Below (If Applicable).     If you need a refill on your cardiac medications before your next appointment, please call your pharmacy. Thank you for choosing [facility]!

## 2017-11-27 NOTE — Telephone Encounter (Signed)
Patient's nephew [name] called stating [name] is unable to get her prescription of Plaquenil because the copay through her insurance is $195.00.  [name] stated that she has been on a program (unable to remember name) to receive the medication for free and wants to know if she can sign up for it again.  [name] stated that [facility] and her husband are on a fixed income and are unable to afford the medication without the assistance.

## 2017-11-27 NOTE — Telephone Encounter (Signed)
Spoke to son [name], patient is out of her [place] medication, and he cannot remember where she was receiving it from. Per notes, Patient receives manufacturer assistance through [place]. Called and confirmed patient is still active through 04/22/18 and has refills. Left message for [name], leaving phone number to call to schedule refill.  Phone# [phone]  2:20 PM Beatriz Chancellor, CPhT

## 2017-11-27 NOTE — Progress Notes (Signed)
Cardiology Office Note    Date:  11/27/2017   ID:  [name], DOB 21-Mar-1941, MRN [id number]  PCP:  [name], MD  Cardiologist: Kate Sable, MD    Chief Complaint  Patient presents with  . Follow-up    3 month visit    History of Present Illness:    [name] is a 77 y.o. female with past medical history of chronic combined systolic and diastolic CHF (EF 78-29% by echo in 06/2015), ischemic cardiomyopathy (s/p Bi-V ICD placement in 07/2012), HTN, HLD, and Stage 3-4 CKD who presents to the office today for 35-month follow-up.   She was last examined by Dr. [name] in 08/2017 and had recently been admitted in 07/2017 for acute on chronic Stage 4 CKD. Weight was stable at 142 lbs at the time of her visit and she was continued on Lasix 40mg  daily. Most recent device interrogation in 08/2017 showed normal device function with thoracic impedance levels trending upwards at that time. This was checked again on 11/11/2017 and thoracic impedance was normal.   In talking with the patient today, she reports overall doing well from a cardiac perspective since her last office visit.  She denies any recent chest discomfort, dyspnea on exertion, orthopnea, PND, or lower extremity edema. She is not very active at baseline but reports being able to go to the grocery store and ambulate without any anginal symptoms.  Reports weight has overall been stable on her home scales. She reports good compliance with her current medication regimen. She does not check her blood pressure regularly but it is well controlled at 120/56 during today's visit.   Past Medical History:  Diagnosis Date  . Anemia    Hgb of 9-10  . Anemia of chronic disease    Hgb of 9-10 chronically; 06/2010: H&H-10.7/33.5, MCV-81, normal iron studies in 2010   . Arteriosclerotic cardiovascular disease (ASCVD)    Remote PTCA by patient report; LBBB; associated cardiomyopathy, presumed ischemic with EF 40-45% previously,  20% in 06/2009; h/o clinical congestive heart failure; negative stress nuclear in 2009 with inferoseptal and apical scar  . Automatic implantable cardioverter-defibrillator in situ   . Breast cancer ([place])    breast  . Chronic bronchitis ([place])   . Chronic renal disease, stage 3, moderately decreased glomerular filtration rate (GFR) between 30-59 mL/min/1.73 square meter (HCC) 02/01/2016  . Congestive heart failure (CHF) ([place])   . Elevated cholesterol   . Elevated sed rate   . GERD (gastroesophageal reflux disease)   . GI bleed   . Gout   . HOH (hard of hearing)   . Hyperlipidemia   . Hypertension   . Rheumatoid arthritis ([place])   . UTI (urinary tract infection)     Past Surgical History:  Procedure Laterality Date  . BI-VENTRICULAR IMPLANTABLE CARDIOVERTER DEFIBRILLATOR N/A 07/28/2012   Procedure: BI-VENTRICULAR IMPLANTABLE CARDIOVERTER DEFIBRILLATOR  (CRT-D);  Surgeon: Evans Lance, MD;  Location: [facility] CATH LAB;  Service: Cardiovascular;  Laterality: N/A;  . BI-VENTRICULAR IMPLANTABLE CARDIOVERTER DEFIBRILLATOR  (CRT-D)  07/28/2012  . BREAST BIOPSY Bilateral   . CATARACT EXTRACTION W/ INTRAOCULAR LENS IMPLANT Left   . CATARACT EXTRACTION W/PHACO Right 09/15/2013   Procedure: CATARACT EXTRACTION PHACO AND INTRAOCULAR LENS PLACEMENT (IOC);  Surgeon: Elta Guadeloupe T. Gershon Crane, MD;  Location: [facility] ORS;  Service: Ophthalmology;  Laterality: Right;  CDE:  10.74  . COLONOSCOPY N/A 03/04/2015   Procedure: COLONOSCOPY;  Surgeon: Rogene Houston, MD;  Location: [facility] ENDO SUITE;  Service: Endoscopy;  Laterality: N/A;  10:50   . ESOPHAGOGASTRODUODENOSCOPY N/A 03/04/2015   Procedure: ESOPHAGOGASTRODUODENOSCOPY (EGD);  Surgeon: Rogene Houston, MD;  Location: [facility] END[facility];  Service: Endoscopy;  Laterality: N/A;  . ESOPHAGOGASTRODUODENOSCOPY N/A 09/21/2016   Procedure: ESOPHAGOGASTRODUODENOSCOPY (EGD);  Surgeon: Rogene Houston, MD;  Location: [facility];  Service: Endoscopy;  Laterality: N/A;  . GIVENS CAPSULE  STUDY N/A 09/22/2016   Procedure: GIVENS CAPSULE STUDY;  Surgeon: Rogene Houston, MD;  Location: [facility];  Service: Endoscopy;  Laterality: N/A;  . MASTECTOMY Left 1998  . TENDON TRANSFER Right 08/15/2017   Procedure: TENDON TRANSFER RIGHT WRIST EXTENSORS AS NEEDED, ULNA RESECTION AND STABILIZATION;  Surgeon: Charlotte Crumb, MD;  Location: [facility];  Service: Orthopedics;  Laterality: Right;  . TOTAL KNEE ARTHROPLASTY Right    Dr.Harrison  . TUBAL LIGATION      Current Medications: Outpatient Medications Prior to Visit  Medication Sig Dispense Refill  . acetaminophen (TYLENOL) 500 MG tablet Take 500 mg by mouth every 6 (six) hours as needed (FOR PAIN/HEADACHES).    [name] allopurinol (ZYLOPRIM) 100 MG tablet TAKE 1 TABLET (100 MG TOTAL) BY MOUTH DAILY. 90 tablet 0  . carvedilol (COREG) 6.25 MG tablet TAKE 1 TABLET BY MOUTH TWICE A DAY WITH MEALS 60 tablet 3  . colchicine (COLCRYS) 0.6 MG tablet Take 0.6 mg by mouth daily.    . diphenhydrAMINE (BENADRYL) 25 mg capsule Take 25 mg by mouth 2 (two) times daily as needed for itching or allergies.     . furosemide (LASIX) 40 MG tablet Take 40 mg by mouth daily.     . hydroxychloroquine (PLAQUENIL) 200 MG tablet TAKE 1 TABLET BY MOUTH DAILY. MONDAY THRU FRIDAY 40 tablet 0  . lovastatin (MEVACOR) 40 MG tablet TAKE 1 TABLET (40 MG TOTAL) BY MOUTH AT BEDTIME. 30 tablet 6  . Magnesium 500 MG CAPS Take 500 mg by mouth at bedtime.    . pantoprazole (PROTONIX) 20 MG tablet Take 1 tablet (20 mg total) by mouth daily. 90 tablet 3  . POTASSIUM PO Take by mouth daily.     No facility-administered medications prior to visit.      Allergies:   Macrobid [nitrofurantoin macrocrystal]   Social History   Socioeconomic History  . Marital status: Married    Spouse name: Not on file  . Number of children: Not on file  . Years of education: Not on file  . Highest education level: Not on file  Occupational History  . Occupation: Retired  Scientific laboratory technician  .  Financial resource strain: Not very hard  . Food insecurity:    Worry: Never true    Inability: Never true  . Transportation needs:    Medical: No    Non-medical: No  Tobacco Use  . Smoking status: Light Tobacco Smoker    Packs/day: 0.25    Years: 30.00    Pack years: 7.50    Types: Cigarettes    Start date: 09/17/1956    Last attempt to quit: 04/24/2007    Years since quitting: 10.6  . Smokeless tobacco: Current User    Types: Chew  . Tobacco comment: 07/03/2013 "smoked some; don't know how much or how long or when I quit"  Substance and Sexual Activity  . Alcohol use: No    Alcohol/week: 0.0 oz  . Drug use: No  . Sexual activity: Not Currently    Birth control/protection: Post-menopausal  Lifestyle  . Physical activity:    Days per week:  Not on file    Minutes per session: Not on file  . Stress: Not on file  Relationships  . Social connections:    Talks on phone: Not on file    Gets together: Not on file    Attends religious service: Not on file    Active member of club or organization: Not on file    Attends meetings of clubs or organizations: Not on file    Relationship status: Not on file  Other Topics Concern  . Not on file  Social History Narrative   Married, lives with spouse   1 daughter, died in 4 in a car accident   Retired - worked in [employer] fundraiser   No recent travel     Family History:  The patient's family history includes Diabetes in her father; Hypertension in her brother; Pancreatic cancer in her father.   Review of Systems:   Please see the history of present illness.     General:  No chills, fever, night sweats or weight changes. Positive for fatigue (chronic with no recent changes).  Cardiovascular:  No chest pain, dyspnea on exertion, edema, orthopnea, palpitations, paroxysmal nocturnal dyspnea. Dermatological: No rash, lesions/masses Respiratory: No cough, dyspnea Urologic: No hematuria, dysuria Abdominal:   No nausea, vomiting, diarrhea,  bright red blood per rectum, melena, or hematemesis Neurologic:  No visual changes, wkns, changes in mental status. All other systems reviewed and are otherwise negative except as noted above.   Physical Exam:    VS:  BP (!) 120/56   Pulse 72   Ht 5\' 5"  (1.651 m)   Wt 142 lb 9.6 oz (64.7 kg)   SpO2 97%   BMI 23.73 kg/m    General: Well developed, well nourished [place] American female appearing in no acute distress. Head: Normocephalic, atraumatic, sclera non-icteric, no xanthomas, nares are without discharge.  Neck: No carotid bruits. JVD not elevated.  Lungs: Respirations regular and unlabored, without wheezes or rales.  Heart: Regular rate and rhythm. No S3 or S4.  No murmur, no rubs, or gallops appreciated. Abdomen: Soft, non-tender, non-distended with normoactive bowel sounds. No hepatomegaly. No rebound/guarding. No obvious abdominal masses. Msk:  Strength and tone appear normal for age. No joint deformities or effusions. Extremities: No clubbing or cyanosis. No lower extremity edema.  Distal pedal pulses are 2+ bilaterally. Neuro: Alert and oriented X 3. Moves all extremities spontaneously. No focal deficits noted. Psych:  Responds to questions appropriately with a normal affect. Skin: No rashes or lesions noted  Wt Readings from Last 3 Encounters:  11/27/17 142 lb 9.6 oz (64.7 kg)  09/11/17 147 lb (66.7 kg)  09/04/17 144 lb 6.4 oz (65.5 kg)    Studies/Labs Reviewed:   EKG:  EKG is not ordered today.    Recent Labs: 08/28/2017: ALT 16; BUN 30; Creatinine, Ser 1.24; Hemoglobin 8.7; Platelets 234; Potassium 3.6; Sodium 138   Lipid Panel    Component Value Date/Time   CHOL 134 05/23/2012 0905   TRIG 103 05/23/2012 0905   HDL 30 (L) 05/23/2012 0905   CHOLHDL 4.5 05/23/2012 0905   VLDL 21 05/23/2012 0905   LDLCALC 83 05/23/2012 0905    Additional studies/ records that were reviewed today include:   Echocardiogram: 06/2015 Study Conclusions  - Left ventricle: The  cavity size was normal. Wall thickness was   increased in a pattern of mild LVH. Systolic function was   moderately to severely reduced. The estimated ejection fraction   was in the range of  30% to 35%. Diffuse hypokinesis. Doppler   parameters are consistent with abnormal left ventricular   relaxation (grade 1 diastolic dysfunction). Doppler parameters   are consistent with high ventricular filling pressure. - Regional wall motion abnormality: Akinesis of the basal inferior   myocardium. - Aortic valve: Mildly to moderately calcified annulus. Trileaflet. - Mitral valve: There was mild regurgitation. - Left atrium: The atrium was mildly dilated. - Right ventricle: Pacer wire or catheter noted in right ventricle. - Right atrium: Pacer wire or catheter noted in right atrium. - Tricuspid valve: There was mild regurgitation. - Pulmonary arteries: PA peak pressure: 27 mm Hg (S).  Assessment:    1. Chronic combined systolic and diastolic CHF (congestive heart failure) (HCC)   2. Cardiomyopathy, ischemic   3. Biventricular ICD (implantable cardioverter-defibrillator) in place   4. Essential hypertension   5. Chronic kidney disease (CKD), stage IV (severe) (HCC)      Plan:   In order of problems listed above:  1. Chronic Combined Systolic and Diastolic CHF - the patient has a known reduced EF of 30-35% by most recent echocardiogram in 06/2015. She denies any recent dyspnea on exertion, orthopnea, PND, or lower extremity edema. Appears euvolemic by examination today. - remains on Coreg 6.25mg  BID. Was previously on Lisinopril but this was discontinued earlier this year given her variable kidney function. Will recheck BMET with her upcoming labs next week. Unsure if her BP would allow for the addition of Hydralazine and Nitrates.   2. Ischemic Cardiomyopathy/ Known CAD - she has a history of prior MI's by review of notes but is unable to provide details surrounding this. She denies any  recent chest pain or dyspnea on exertion. Remains on BB and statin therapy. No longer on ASA given chronic anemia.  - BiV ICD followed by Dr. Lovena Le. Most recent interrogation showed normal device function.   3.  HTN - BP is well-controlled at 120/56 during today's visit. - continue Coreg 6.25mg  BID.  4. Stage 3-4 CKD - creatinine peaked at 2.88 during recent admission in 07/2017, improved to 1.24 on repeat labs in 08/2017. Will recheck BMET for reassessment.    Medication Adjustments/Labs and Tests Ordered: Current medicines are reviewed at length with the patient today.  Concerns regarding medicines are outlined above.  Medication changes, Labs and Tests ordered today are listed in the Patient Instructions below. Patient Instructions  Medication Instructions:  Your physician recommends that you continue on your current medications as directed. Please refer to the Current Medication list given to you today.   Labwork: Your physician recommends that you return for lab work please have BMET on 12/05/17.   Testing/Procedures: NONE   Follow-Up: Your physician recommends that you schedule a follow-up appointment in: 4 Months with Dr. [name].   Any Other Special Instructions Will Be Listed Below (If Applicable).  If you need a refill on your cardiac medications before your next appointment, please call your pharmacy. Thank you for choosing [facility]!     Signed, Erma Heritage, PA-C  11/27/2017 8:14 PM    [facility] [address] [place], [place] [address] Phone: [phone]

## 2017-11-28 ENCOUNTER — Ambulatory Visit: Payer: Medicare HMO | Admitting: Cardiovascular Disease

## 2017-12-05 ENCOUNTER — Inpatient Hospital Stay (HOSPITAL_COMMUNITY): Payer: Medicare HMO | Attending: Hematology

## 2017-12-05 DIAGNOSIS — D500 Iron deficiency anemia secondary to blood loss (chronic): Secondary | ICD-10-CM | POA: Insufficient documentation

## 2017-12-05 DIAGNOSIS — Q2733 Arteriovenous malformation of digestive system vessel: Secondary | ICD-10-CM | POA: Insufficient documentation

## 2017-12-05 DIAGNOSIS — N189 Chronic kidney disease, unspecified: Secondary | ICD-10-CM | POA: Diagnosis not present

## 2017-12-05 DIAGNOSIS — K922 Gastrointestinal hemorrhage, unspecified: Secondary | ICD-10-CM | POA: Diagnosis not present

## 2017-12-05 DIAGNOSIS — S66811A Strain of other specified muscles, fascia and tendons at wrist and hand level, right hand, initial encounter: Secondary | ICD-10-CM | POA: Diagnosis not present

## 2017-12-05 DIAGNOSIS — D638 Anemia in other chronic diseases classified elsewhere: Secondary | ICD-10-CM

## 2017-12-05 LAB — CBC WITH DIFFERENTIAL/PLATELET
Basophils Absolute: 0 10*3/uL (ref 0.0–0.1)
Basophils Relative: 1 %
Eosinophils Absolute: 0.1 10*3/uL (ref 0.0–0.7)
Eosinophils Relative: 3 %
HCT: 31 % — ABNORMAL LOW (ref 36.0–46.0)
HEMOGLOBIN: 9.6 g/dL — AB (ref 12.0–15.0)
LYMPHS PCT: 33 %
Lymphs Abs: 1.7 10*3/uL (ref 0.7–4.0)
MCH: 26.4 pg (ref 26.0–34.0)
MCHC: 31 g/dL (ref 30.0–36.0)
MCV: 85.4 fL (ref 78.0–100.0)
MONO ABS: 0.7 10*3/uL (ref 0.1–1.0)
MONOS PCT: 13 %
NEUTROS ABS: 2.6 10*3/uL (ref 1.7–7.7)
Neutrophils Relative %: 50 %
Platelets: 216 10*3/uL (ref 150–400)
RBC: 3.63 MIL/uL — ABNORMAL LOW (ref 3.87–5.11)
RDW: 17.5 % — ABNORMAL HIGH (ref 11.5–15.5)
WBC: 5.1 10*3/uL (ref 4.0–10.5)

## 2017-12-05 LAB — COMPREHENSIVE METABOLIC PANEL
ALK PHOS: 105 U/L (ref 38–126)
ALT: 13 U/L (ref 0–44)
AST: 19 U/L (ref 15–41)
Albumin: 3.4 g/dL — ABNORMAL LOW (ref 3.5–5.0)
Anion gap: 10 (ref 5–15)
BILIRUBIN TOTAL: 0.7 mg/dL (ref 0.3–1.2)
BUN: 28 mg/dL — AB (ref 8–23)
CALCIUM: 9 mg/dL (ref 8.9–10.3)
CO2: 25 mmol/L (ref 22–32)
Chloride: 104 mmol/L (ref 98–111)
Creatinine, Ser: 1.32 mg/dL — ABNORMAL HIGH (ref 0.44–1.00)
GFR calc Af Amer: 43 mL/min — ABNORMAL LOW (ref >60)
GFR, EST NON AFRICAN AMERICAN: 37 mL/min — AB (ref >60)
Glucose, Bld: 119 mg/dL — ABNORMAL HIGH (ref 70–99)
Potassium: 4 mmol/L (ref 3.5–5.1)
Sodium: 139 mmol/L (ref 135–145)
TOTAL PROTEIN: 8.2 g/dL — AB (ref 6.5–8.1)

## 2017-12-05 LAB — VITAMIN B12: Vitamin B-12: 804 pg/mL (ref 180–914)

## 2017-12-05 LAB — LACTATE DEHYDROGENASE: LDH: 176 U/L (ref 98–192)

## 2017-12-05 LAB — FOLATE: FOLATE: 9.9 ng/mL (ref >5.9)

## 2017-12-05 LAB — FERRITIN: Ferritin: 439 ng/mL — ABNORMAL HIGH (ref 11–307)

## 2017-12-06 LAB — HAPTOGLOBIN: Haptoglobin: 467 mg/dL — ABNORMAL HIGH (ref 34–200)

## 2017-12-09 DIAGNOSIS — I10 Essential (primary) hypertension: Secondary | ICD-10-CM | POA: Diagnosis not present

## 2017-12-09 DIAGNOSIS — M069 Rheumatoid arthritis, unspecified: Secondary | ICD-10-CM | POA: Diagnosis not present

## 2017-12-09 DIAGNOSIS — Z0001 Encounter for general adult medical examination with abnormal findings: Secondary | ICD-10-CM | POA: Diagnosis not present

## 2017-12-09 DIAGNOSIS — I5022 Chronic systolic (congestive) heart failure: Secondary | ICD-10-CM | POA: Diagnosis not present

## 2017-12-09 DIAGNOSIS — Z1389 Encounter for screening for other disorder: Secondary | ICD-10-CM | POA: Diagnosis not present

## 2017-12-09 DIAGNOSIS — Z1331 Encounter for screening for depression: Secondary | ICD-10-CM | POA: Diagnosis not present

## 2017-12-10 LAB — CUP PACEART REMOTE DEVICE CHECK
Brady Statistic AP VS Percent: 1.03 %
Brady Statistic AS VP Percent: 81.05 %
Brady Statistic AS VS Percent: 1.51 %
Brady Statistic RA Percent Paced: 16.91 %
Brady Statistic RV Percent Paced: 10.01 %
Date Time Interrogation Session: 20190805071712
HighPow Impedance: 82 Ohm
Implantable Lead Implant Date: 20140407
Implantable Lead Location: 753859
Implantable Lead Model: 6935
Implantable Pulse Generator Implant Date: 20140407
Lead Channel Impedance Value: 342 Ohm
Lead Channel Impedance Value: 380 Ohm
Lead Channel Impedance Value: 494 Ohm
Lead Channel Impedance Value: 627 Ohm
Lead Channel Pacing Threshold Amplitude: 0.625 V
Lead Channel Pacing Threshold Amplitude: 0.625 V
Lead Channel Pacing Threshold Pulse Width: 0.4 ms
Lead Channel Pacing Threshold Pulse Width: 0.4 ms
Lead Channel Sensing Intrinsic Amplitude: 28.25 mV
Lead Channel Setting Pacing Amplitude: 2 V
Lead Channel Setting Pacing Pulse Width: 0.4 ms
Lead Channel Setting Pacing Pulse Width: 0.5 ms
Lead Channel Setting Sensing Sensitivity: 0.3 mV
MDC IDC LEAD IMPLANT DT: 20140407
MDC IDC LEAD IMPLANT DT: 20140407
MDC IDC LEAD LOCATION: 753858
MDC IDC LEAD LOCATION: 753860
MDC IDC MSMT BATTERY REMAINING LONGEVITY: 37 mo
MDC IDC MSMT BATTERY VOLTAGE: 2.96 V
MDC IDC MSMT LEADCHNL LV IMPEDANCE VALUE: 817 Ohm
MDC IDC MSMT LEADCHNL LV PACING THRESHOLD PULSEWIDTH: 0.5 ms
MDC IDC MSMT LEADCHNL RA SENSING INTR AMPL: 1.75 mV
MDC IDC MSMT LEADCHNL RA SENSING INTR AMPL: 1.75 mV
MDC IDC MSMT LEADCHNL RV IMPEDANCE VALUE: 380 Ohm
MDC IDC MSMT LEADCHNL RV PACING THRESHOLD AMPLITUDE: 1 V
MDC IDC MSMT LEADCHNL RV SENSING INTR AMPL: 28.25 mV
MDC IDC SET LEADCHNL LV PACING AMPLITUDE: 1.75 V
MDC IDC SET LEADCHNL RV PACING AMPLITUDE: 2.5 V
MDC IDC STAT BRADY AP VP PERCENT: 16.41 %

## 2017-12-12 ENCOUNTER — Ambulatory Visit (INDEPENDENT_AMBULATORY_CARE_PROVIDER_SITE_OTHER): Payer: Medicare HMO

## 2017-12-12 DIAGNOSIS — I5042 Chronic combined systolic (congestive) and diastolic (congestive) heart failure: Secondary | ICD-10-CM

## 2017-12-12 DIAGNOSIS — Z95810 Presence of automatic (implantable) cardiac defibrillator: Secondary | ICD-10-CM

## 2017-12-12 NOTE — Progress Notes (Signed)
EPIC Encounter for ICM Monitoring  Patient Name: [name] is a 77 y.o. female Date: 12/12/2017 Primary Care Physican: [name], MD Primary Cardiologist:Koneswaran/Lawrence NP Electrophysiologist: Lovena Le Dry Weight:unknown Bi-V Pacing: 91.2%                                                  Call to [name], nephew. Heart Failure questions reviewed, pt asymptomatic.   Thoracic impedance abnormal suggesting fluid accumulation starting 11/28/2017 but trending back to baseline.  Prescribed dosage: Furosemide 40 mg 1 tablet daily.  Labs: 12/05/2017 Creatinine 1.32, BUN 28, Potassium 4.0, Sodium 139, EGFR 37-43 08/28/2017 Creatinine 1.24, BUN 30, Potassium 3.6, Sodium 138, EGFR 41-47 08/15/2017 Potassium 3.6, Sodium142 08/10/2017 Creatinine2.19, BUN61, Potassium4.5, Sodium142, EGFR20-24  08/09/2017 Creatinine2.88, BUN71, Potassium5.4, Sodium139, [id number], 10:09 PM results 08/09/2017 Creatinine2.73, BUN65, Potassium6.1, Sodium135, QURB56-64, 03:06 PM results 05/29/2017 Creatinine 1.81, BUN 45, Potassium 4.5, Sodium 138, EGFR 26-30 03/05/2017 Creatinine 2.27, BUN 47, Potassium 5.0, Sodium 137, EGFR 20-23 01/15/2017 Creatinine 1.56, BUN 37, Potassium 4.4, Sodium 139, EGFR 31-36 11/29/2016 Creatinine 1.68, BUN 30, Potassium 4.1, Sodium 138, EGFR 28-33 10/23/2016 Creatinine 2.08 BUN 31, Potassium 5.3, Sodium 136, EGFR 22-25 10/15/2016 Creatinine 0.99, BUN 18, Potassium 3.0, Sodium 136, EGFR 53->60 10/08/2016 Creatinine 0.95, BUN 16, Potassium 3.1, Sodium 140, EGFR 56->60 09/23/2016 Creatinine 0.74, BUN 13, Potassium 4.0, Sodium 141, EGFR >60  Recommendations:  Advised to restrict salt intake and he will let patient know to cut down on salt intake.   Encouraged to call for fluid symptoms.  Follow-up plan: ICM clinic phone appointment on 12/17/2017 to recheck fluid levels.     Copy of ICM check sent to Dr. Lovena Le and Dr [name].   3 month ICM trend:  12/12/2017    1 Year ICM trend:       [name], RN 12/12/2017 11:59 AM

## 2017-12-17 ENCOUNTER — Ambulatory Visit (INDEPENDENT_AMBULATORY_CARE_PROVIDER_SITE_OTHER): Payer: Medicare HMO

## 2017-12-17 DIAGNOSIS — Z95810 Presence of automatic (implantable) cardiac defibrillator: Secondary | ICD-10-CM

## 2017-12-17 DIAGNOSIS — I5042 Chronic combined systolic (congestive) and diastolic (congestive) heart failure: Secondary | ICD-10-CM

## 2017-12-17 NOTE — Progress Notes (Signed)
EPIC Encounter for ICM Monitoring  Patient Name: [name] is a 77 y.o. female Date: 12/17/2017 Primary Care Physican: [name], MD Primary Cardiologist:Koneswaran/Lawrence NP Electrophysiologist: Lovena Le Dry Weight:unknown Bi-V Pacing: 93.5%       Attempted to [name], nephew.   Left detail message per DPR.  Transmission reviewed.    Thoracic impedance returned to baseline normal since last ICM remote transmission on 12/12/2017.  Prescribed dosage: Furosemide 40 mg 1 tablet daily.  Labs: 12/05/2017 Creatinine 1.32, BUN 28, Potassium 4.0, Sodium 139, EGFR 37-43 08/28/2017 Creatinine 1.24, BUN 30, Potassium 3.6, Sodium 138, EGFR 41-47 08/15/2017 Potassium 3.6, Sodium142 08/10/2017 Creatinine2.19, BUN61, Potassium4.5, Sodium142, EGFR20-24  08/09/2017 Creatinine2.88, BUN71, Potassium5.4, Sodium139, [id number], 10:09 PM results 08/09/2017 Creatinine2.73, BUN65, Potassium6.1, Sodium135, [id number], 03:06 PM results  Recommendations: Left voice mail with ICM number and encouraged to call if experiencing any fluid symptoms.  Follow-up plan: ICM clinic phone appointment on 01/13/2018.     Copy of ICM check sent to Dr. Lovena Le.   3 month ICM trend: 12/17/2017    1 Year ICM trend:       [name], RN 12/17/2017 7:57 AM

## 2018-01-13 ENCOUNTER — Ambulatory Visit (INDEPENDENT_AMBULATORY_CARE_PROVIDER_SITE_OTHER): Payer: Medicare HMO

## 2018-01-13 DIAGNOSIS — I5042 Chronic combined systolic (congestive) and diastolic (congestive) heart failure: Secondary | ICD-10-CM | POA: Diagnosis not present

## 2018-01-13 DIAGNOSIS — Z95810 Presence of automatic (implantable) cardiac defibrillator: Secondary | ICD-10-CM | POA: Diagnosis not present

## 2018-01-13 NOTE — Progress Notes (Signed)
EPIC Encounter for ICM Monitoring  Patient Name: [name] is a 77 y.o. female Date: 01/13/2018 Primary Care Physican: [name], MD Primary Cardiologist:Koneswaran/Lawrence NP Electrophysiologist: Lovena Le Dry Weight:unknown Bi-V Pacing: 89.2%                                           Attempted to [name], nephew.Attempted call to patient and unable to reach.  Left detailed message, per DPR, regarding transmission.  Transmission reviewed.    Thoracic impedance normal.  Prescribed: Furosemide 40 mg 1 tablet daily.  Labs: 12/05/2017 Creatinine 1.32, BUN 28, Potassium 4.0, Sodium 139, EGFR 37-43 08/28/2017 Creatinine 1.24, BUN 30, Potassium 3.6, Sodium 138, EGFR 41-47 08/15/2017 Potassium 3.6, Sodium142 08/10/2017 Creatinine2.19, BUN61, Potassium4.5, Sodium142, EGFR20-24  08/09/2017 Creatinine2.88, BUN71, Potassium5.4, Sodium139, FQEH87-06, 10:09 PM results 08/09/2017 Creatinine2.73, BUN65, Potassium6.1, Sodium135, NMMG08-88, 03:06 PM results  Recommendations: Left voice mail with ICM number and encouraged to call if experiencing any fluid symptoms.  Follow-up plan: ICM clinic phone appointment on 02/24/2018.   Office appointment scheduled 01/22/2018 with Dr. Lovena Le.    Copy of ICM check sent to Dr. Lovena Le.   3 month ICM trend: 01/13/2018    1 Year ICM trend:       [name], RN 01/13/2018 1:59 PM

## 2018-01-16 DIAGNOSIS — I10 Essential (primary) hypertension: Secondary | ICD-10-CM | POA: Diagnosis not present

## 2018-01-16 DIAGNOSIS — I5022 Chronic systolic (congestive) heart failure: Secondary | ICD-10-CM | POA: Diagnosis not present

## 2018-01-16 DIAGNOSIS — M069 Rheumatoid arthritis, unspecified: Secondary | ICD-10-CM | POA: Diagnosis not present

## 2018-01-16 DIAGNOSIS — Z23 Encounter for immunization: Secondary | ICD-10-CM | POA: Diagnosis not present

## 2018-01-16 DIAGNOSIS — S43204S Unspecified dislocation of right sternoclavicular joint, sequela: Secondary | ICD-10-CM | POA: Diagnosis not present

## 2018-01-17 ENCOUNTER — Other Ambulatory Visit: Payer: Self-pay | Admitting: Rheumatology

## 2018-01-17 DIAGNOSIS — M0579 Rheumatoid arthritis with rheumatoid factor of multiple sites without organ or systems involvement: Secondary | ICD-10-CM

## 2018-01-17 NOTE — Telephone Encounter (Signed)
Last visit: 09/11/2017 Next visit: 02/13/2018 Labs: 08/28/2017 creat 1.24 previous 2.19 GFR 47 previous 24 Eye exam: 01/08/2017  Okay to refill per Dr. Estanislado Pandy

## 2018-01-22 ENCOUNTER — Ambulatory Visit (INDEPENDENT_AMBULATORY_CARE_PROVIDER_SITE_OTHER): Payer: Medicare HMO | Admitting: Internal Medicine

## 2018-01-22 ENCOUNTER — Encounter: Payer: Self-pay | Admitting: Internal Medicine

## 2018-01-22 VITALS — BP 144/78 | HR 80 | Ht 64.0 in | Wt 143.0 lb

## 2018-01-22 DIAGNOSIS — I5042 Chronic combined systolic (congestive) and diastolic (congestive) heart failure: Secondary | ICD-10-CM | POA: Diagnosis not present

## 2018-01-22 NOTE — Patient Instructions (Signed)
Medication Instructions:  Your physician recommends that you continue on your current medications as directed. Please refer to the Current Medication list given to you today.   Labwork: NONE   Testing/Procedures: NONE   Follow-Up: Your physician wants you to follow-up in: 1 Year with Dr. Taylor. You will receive a reminder letter in the mail two months in advance. If you don't receive a letter, please call our office to schedule the follow-up appointment.   Any Other Special Instructions Will Be Listed Below (If Applicable).     If you need a refill on your cardiac medications before your next appointment, please call your pharmacy.  Thank you for choosing [facility]!   

## 2018-01-22 NOTE — Progress Notes (Signed)
HPI [name] returns today for followup. She is a very pleasant 77 year old woman with a history of hypertension, chronic systolic heart failure, and left bundle branch block, status post biventricular ICD implantation. She denies any recent ICD shocks. Her heart failure symptoms are improved but she still admits to sodium indiscretion. She denies chest pain, shortness of breath, or syncope. She has not had much in the way of peripheral edema. No ICD shocks. No Known Allergies Allergies  Allergen Reactions  . Macrobid [Nitrofurantoin Macrocrystal] Hives and Itching     Current Outpatient Medications  Medication Sig Dispense Refill  . acetaminophen (TYLENOL) 500 MG tablet Take 500 mg by mouth every 6 (six) hours as needed (FOR PAIN/HEADACHES).    [name] allopurinol (ZYLOPRIM) 100 MG tablet TAKE 1 TABLET (100 MG TOTAL) BY MOUTH DAILY. 90 tablet 0  . carvedilol (COREG) 6.25 MG tablet TAKE 1 TABLET BY MOUTH TWICE A DAY WITH MEALS 60 tablet 3  . colchicine (COLCRYS) 0.6 MG tablet Take 0.6 mg by mouth daily.    . diphenhydrAMINE (BENADRYL) 25 mg capsule Take 25 mg by mouth 2 (two) times daily as needed for itching or allergies.     . furosemide (LASIX) 40 MG tablet Take 40 mg by mouth daily.     . hydroxychloroquine (PLAQUENIL) 200 MG tablet TAKE 1 TABLET BY MOUTH DAILY. MONDAY THRU FRIDAY 40 tablet 0  . lovastatin (MEVACOR) 40 MG tablet TAKE 1 TABLET (40 MG TOTAL) BY MOUTH AT BEDTIME. 30 tablet 6  . Magnesium 500 MG CAPS Take 500 mg by mouth at bedtime.    . pantoprazole (PROTONIX) 20 MG tablet Take 1 tablet (20 mg total) by mouth daily. 90 tablet 3  . POTASSIUM PO Take by mouth daily.     No current facility-administered medications for this visit.      Past Medical History:  Diagnosis Date  . Anemia    Hgb of 9-10  . Anemia of chronic disease    Hgb of 9-10 chronically; 06/2010: H&H-10.7/33.5, MCV-81, normal iron studies in 2010   . Arteriosclerotic cardiovascular disease (ASCVD)     Remote PTCA by patient report; LBBB; associated cardiomyopathy, presumed ischemic with EF 40-45% previously, 20% in 06/2009; h/o clinical congestive heart failure; negative stress nuclear in 2009 with inferoseptal and apical scar  . Automatic implantable cardioverter-defibrillator in situ   . Breast cancer ([place])    breast  . Chronic bronchitis ([place])   . Chronic renal disease, stage 3, moderately decreased glomerular filtration rate (GFR) between 30-59 mL/min/1.73 square meter (HCC) 02/01/2016  . Congestive heart failure (CHF) ([place])   . Elevated cholesterol   . Elevated sed rate   . GERD (gastroesophageal reflux disease)   . GI bleed   . Gout   . HOH (hard of hearing)   . Hyperlipidemia   . Hypertension   . Rheumatoid arthritis ([place])   . UTI (urinary tract infection)     ROS:   All systems reviewed and negative except as noted in the HPI.   Past Surgical History:  Procedure Laterality Date  . BI-VENTRICULAR IMPLANTABLE CARDIOVERTER DEFIBRILLATOR N/A 07/28/2012   Procedure: BI-VENTRICULAR IMPLANTABLE CARDIOVERTER DEFIBRILLATOR  (CRT-D);  Surgeon: Evans Lance, MD;  Location: [facility] CATH LAB;  Service: Cardiovascular;  Laterality: N/A;  . BI-VENTRICULAR IMPLANTABLE CARDIOVERTER DEFIBRILLATOR  (CRT-D)  07/28/2012  . BREAST BIOPSY Bilateral   . CATARACT EXTRACTION W/ INTRAOCULAR LENS IMPLANT Left   . CATARACT EXTRACTION W/PHACO Right 09/15/2013  Procedure: CATARACT EXTRACTION PHACO AND INTRAOCULAR LENS PLACEMENT (IOC);  Surgeon: Elta Guadeloupe T. Gershon Crane, MD;  Location: [facility];  Service: Ophthalmology;  Laterality: Right;  CDE:  10.74  . COLONOSCOPY N/A 03/04/2015   Procedure: COLONOSCOPY;  Surgeon: Rogene Houston, MD;  [facility];  Service: Endoscopy;  Laterality: N/A;  10:50   . ESOPHAGOGASTRODUODENOSCOPY N/A 03/04/2015   Procedure: ESOPHAGOGASTRODUODENOSCOPY (EGD);  Surgeon: Rogene Houston, MD;  [facility];  Service: Endoscopy;  Laterality: N/A;  .  ESOPHAGOGASTRODUODENOSCOPY N/A 09/21/2016   Procedure: ESOPHAGOGASTRODUODENOSCOPY (EGD);  Surgeon: Rogene Houston, MD;  [facility];  Service: Endoscopy;  Laterality: N/A;  . GIVENS CAPSULE STUDY N/A 09/22/2016   Procedure: GIVENS CAPSULE STUDY;  Surgeon: Rogene Houston, MD;  [facility];  Service: Endoscopy;  Laterality: N/A;  . MASTECTOMY Left 1998  . TENDON TRANSFER Right 08/15/2017   Procedure: TENDON TRANSFER RIGHT WRIST EXTENSORS AS NEEDED, ULNA RESECTION AND STABILIZATION;  Surgeon: Charlotte Crumb, MD;  Location: [place];  Service: Orthopedics;  Laterality: Right;  . TOTAL KNEE ARTHROPLASTY Right    Dr.Harrison  . TUBAL LIGATION       Family History  Problem Relation Age of Onset  . Diabetes Father   . Pancreatic cancer Father   . Hypertension Brother      Social History   Socioeconomic History  . Marital status: Married    Spouse name: Not on file  . Number of children: Not on file  . Years of education: Not on file  . Highest education level: Not on file  Occupational History  . Occupation: Retired  Scientific laboratory technician  . Financial resource strain: Not very hard  . Food insecurity:    Worry: Never true    Inability: Never true  . Transportation needs:    Medical: No    Non-medical: No  Tobacco Use  . Smoking status: Light Tobacco Smoker    Packs/day: 0.25    Years: 30.00    Pack years: 7.50    Types: Cigarettes    Start date: 09/17/1956    Last attempt to quit: 04/24/2007    Years since quitting: 10.7  . Smokeless tobacco: Current User    Types: Chew  . Tobacco comment: 07/03/2013 "smoked some; don't know how much or how long or when I quit"  Substance and Sexual Activity  . Alcohol use: No    Alcohol/week: 0.0 standard drinks  . Drug use: No  . Sexual activity: Not Currently    Birth control/protection: Post-menopausal  Lifestyle  . Physical activity:    Days per week: Not on file    Minutes per session: Not on file  . Stress: Not on  file  Relationships  . Social connections:    Talks on phone: Not on file    Gets together: Not on file    Attends religious service: Not on file    Active member of club or organization: Not on file    Attends meetings of clubs or organizations: Not on file    Relationship status: Not on file  . Intimate partner violence:    Fear of current or ex partner: No    Emotionally abused: No    Physically abused: No    Forced sexual activity: No  Other Topics Concern  . Not on file  Social History Narrative   Married, lives with spouse   1 daughter, died in 69 in a car accident   Retired -  worked in [employer] fundraiser   No recent travel     BP (!) 144/78 (BP Location: Right Arm)   Pulse 80   Ht 5\' 4"  (1.626 m)   Wt 143 lb (64.9 kg)   SpO2 95%   BMI 24.55 kg/m   Physical Exam:  Well appearing NAD HEENT: Unremarkable Neck:  No JVD, no thyromegally Lymphatics:  No adenopathy Back:  No CVA tenderness Lungs:  Clear with no wheezes HEART:  Regular rate rhythm, no murmurs, no rubs, no clicks Abd:  soft, positive bowel sounds, no organomegally, no rebound, no guarding Ext:  2 plus pulses, no edema, no cyanosis, no clubbing Skin:  No rashes no nodules Neuro:  CN II through XII intact, motor grossly intact  EKG -none  DEVICE  Normal device function.  See PaceArt for details. Oversensing is present and the sensitivity has been decreased and oversensing has resolved.   Assess/Plan: 1. ICD - her device has about 3 years of battery longevity 2. Chronic systolic heart failure - she has class 2 symptoms. She admits to eating too much salt. I have asked her to cut the salt out of her diet. 3. HTN - her blood pressure is up a bit. No change in meds.  4. CAD - she has class 2 anginal symptoms. We will follow. She may need NTG but none yet.  Mikle Bosworth.D.

## 2018-01-28 LAB — CUP PACEART INCLINIC DEVICE CHECK
Implantable Lead Implant Date: 20140407
Implantable Lead Implant Date: 20140407
Implantable Lead Implant Date: 20140407
Implantable Lead Location: 753859
Implantable Lead Location: 753860
Implantable Lead Model: 4194
Implantable Lead Model: 5076
Implantable Lead Model: 6935
MDC IDC LEAD LOCATION: 753858
MDC IDC PG IMPLANT DT: 20140407
MDC IDC SESS DTM: 20191008142208

## 2018-01-30 NOTE — Progress Notes (Signed)
Office Visit Note  Patient: [name]             Date of Birth: 06-13-1940           MRN: [id number]             PCP: [name], MD Referring: [name], MD Visit Date: 02/13/2018 Occupation: '@GUAROCC' @  Subjective:  Medication monitoring   History of Present Illness: [name] is a 77 y.o. female with history of seropositive rheumatoid arthritis, osteoarthritis, and gout.  She is on PLQ 200 mg 1 tablet by mouth Monday through Friday..  She is taking Allopurinol 100 mg by mouth daily.   She is accompanied by her son who provided most of the history.  She has not had any recent gout flares.  She has no joint pain or joint swelling right now. She reports the right knee that is replaced occasionally causes discomfort. She has no pain in the left knee.  She is wearing a right arm splint for support following surgery performed by Dr. Burney Gauze in April 2019.     Activities of Daily Living:  Patient reports morning stiffness for 1  hour.   Patient Denies nocturnal pain.  Difficulty dressing/grooming: Denies Difficulty climbing stairs: Denies Difficulty getting out of chair: Denies Difficulty using hands for taps, buttons, cutlery, and/or writing: Denies  Review of Systems  Constitutional: Negative for fatigue.  HENT: Negative for mouth sores, mouth dryness and nose dryness.   Eyes: Negative for pain, visual disturbance and dryness.  Respiratory: Negative for cough, hemoptysis, shortness of breath and difficulty breathing.   Cardiovascular: Negative for chest pain, palpitations, hypertension and swelling in legs/feet.  Gastrointestinal: Negative for blood in stool, constipation and diarrhea.  Endocrine: Negative for increased urination.  Genitourinary: Negative for difficulty urinating and painful urination.  Musculoskeletal: Positive for morning stiffness. Negative for arthralgias, joint pain, joint swelling, myalgias, muscle weakness, muscle tenderness and myalgias.    Skin: Negative for color change, pallor, rash, hair loss, nodules/bumps, skin tightness, ulcers and sensitivity to sunlight.  Allergic/Immunologic: Negative for susceptible to infections.  Neurological: Negative for dizziness, numbness, headaches and weakness.  Hematological: Negative for bruising/bleeding tendency and swollen glands.  Psychiatric/Behavioral: Negative for depressed mood and sleep disturbance. The patient is not nervous/anxious.     PMFS History:  Patient Active Problem List   Diagnosis Date Noted  . AKI (acute kidney injury) ([place]) 08/10/2017  . Hyperkalemia 08/10/2017  . Acute blood loss anemia 09/20/2016  . Ischemic cardiomyopathy 09/20/2016  . Chronic combined systolic and diastolic CHF (congestive heart failure) ([place]) 09/20/2016  . CKD (chronic kidney disease), stage IV ([place]) 09/20/2016  . History of breast cancer 08/02/2016  . Primary osteoarthritis of both knees 08/02/2016  . Primary osteoarthritis of both feet 08/02/2016  . History of anemia 07/17/2016  . History of CHF (congestive heart failure) 06/04/2016  . History of chronic kidney disease 06/04/2016  . Symptomatic anemia 06/04/2016  . Elevated sedimentation rate 03/03/2016  . Idiopathic chronic gout of multiple sites without tophus 03/03/2016  . Total knee replacement status, right 03/03/2016  . High risk medication use 03/03/2016  . Chronic kidney disease (CKD) stage G3b/A1, moderately decreased glomerular filtration rate (GFR) between 30-44 mL/min/1.73 square meter and albuminuria creatinine ratio less than 30 mg/g (HCC) 02/01/2016  . CAD S/P remote PCI- no details 09/02/2014  . Cardiomyopathy, ischemic-EF 30-35% March 2015 09/02/2014  . Diastolic dysfunction-grade 2 09/02/2014  . CHF exacerbation ([place]) 08/28/2014  . Acute on  chronic combined systolic and diastolic congestive heart failure ([place]) 08/28/2014  . Iron deficiency anemia 08/20/2013  . Malnutrition of moderate degree ([place]) 07/21/2013  . PNA  (pneumonia) 07/19/2013  . HCAP (healthcare-associated pneumonia) 07/17/2013  . Sepsis ([place]) 07/17/2013  . ARF (acute renal failure) ([place]) 07/17/2013  . Rheumatoid arthritis ([place]) 07/04/2013  . Hypokalemia 07/03/2013  . Chest pain 07/03/2013  . Biventricular ICD in place (MDT 2014) 11/06/2012  . Anemia of chronic disease   . Breast cancer ([place])   . Hypertension   . Dyslipidemia 05/24/2009  . Chronic systolic heart failure ([place]) 05/24/2009  . Primary osteoarthritis of both hands 08/11/2007    Past Medical History:  Diagnosis Date  . Anemia    Hgb of 9-10  . Anemia of chronic disease    Hgb of 9-10 chronically; 06/2010: H&H-10.7/33.5, MCV-81, normal iron studies in 2010   . Arteriosclerotic cardiovascular disease (ASCVD)    Remote PTCA by patient report; LBBB; associated cardiomyopathy, presumed ischemic with EF 40-45% previously, 20% in 06/2009; h/o clinical congestive heart failure; negative stress nuclear in 2009 with inferoseptal and apical scar  . Automatic implantable cardioverter-defibrillator in situ   . Breast cancer ([place])    breast  . Chronic bronchitis ([place])   . Chronic renal disease, stage 3, moderately decreased glomerular filtration rate (GFR) between 30-59 mL/min/1.73 square meter (HCC) 02/01/2016  . Congestive heart failure (CHF) ([place])   . Elevated cholesterol   . Elevated sed rate   . GERD (gastroesophageal reflux disease)   . GI bleed   . Gout   . HOH (hard of hearing)   . Hyperlipidemia   . Hypertension   . Rheumatoid arthritis ([place])   . UTI (urinary tract infection)     Family History  Problem Relation Age of Onset  . Diabetes Father   . Pancreatic cancer Father   . Hypertension Brother    Past Surgical History:  Procedure Laterality Date  . BI-VENTRICULAR IMPLANTABLE CARDIOVERTER DEFIBRILLATOR N/A 07/28/2012   Procedure: BI-VENTRICULAR IMPLANTABLE CARDIOVERTER DEFIBRILLATOR  (CRT-D);  Surgeon: Evans Lance, MD;  Location: [facility] CATH LAB;  Service:  Cardiovascular;  Laterality: N/A;  . BI-VENTRICULAR IMPLANTABLE CARDIOVERTER DEFIBRILLATOR  (CRT-D)  07/28/2012  . BREAST BIOPSY Bilateral   . CATARACT EXTRACTION W/ INTRAOCULAR LENS IMPLANT Left   . CATARACT EXTRACTION W/PHACO Right 09/15/2013   Procedure: CATARACT EXTRACTION PHACO AND INTRAOCULAR LENS PLACEMENT (IOC);  Surgeon: Elta Guadeloupe T. Gershon Crane, MD;  Location: [facility];  Service: Ophthalmology;  Laterality: Right;  CDE:  10.74  . COLONOSCOPY N/A 03/04/2015   Procedure: COLONOSCOPY;  Surgeon: Rogene Houston, MD;  [facility];  Service: Endoscopy;  Laterality: N/A;  10:50   . ESOPHAGOGASTRODUODENOSCOPY N/A 03/04/2015   Procedure: ESOPHAGOGASTRODUODENOSCOPY (EGD);  Surgeon: Rogene Houston, MD;  [facility];  Service: Endoscopy;  Laterality: N/A;  . ESOPHAGOGASTRODUODENOSCOPY N/A 09/21/2016   Procedure: ESOPHAGOGASTRODUODENOSCOPY (EGD);  Surgeon: Rogene Houston, MD;  [facility];  Service: Endoscopy;  Laterality: N/A;  . GIVENS CAPSULE STUDY N/A 09/22/2016   Procedure: GIVENS CAPSULE STUDY;  Surgeon: Rogene Houston, MD;  [facility];  Service: Endoscopy;  Laterality: N/A;  . MASTECTOMY Left 1998  . TENDON TRANSFER Right 08/15/2017   Procedure: TENDON TRANSFER RIGHT WRIST EXTENSORS AS NEEDED, ULNA RESECTION AND STABILIZATION;  Surgeon: Charlotte Crumb, MD;  Location: [place];  Service: Orthopedics;  Laterality: Right;  . TOTAL KNEE ARTHROPLASTY Right    Dr.Harrison  . TUBAL LIGATION  Social History   Social History Narrative   Married, lives with spouse   1 daughter, died in 88 in a car accident   Retired - worked in [employer] fundraiser   No recent travel    Objective: Vital Signs: BP (!) 110/54 (BP Location: Right Arm, Patient Position: Sitting, Cuff Size: Normal)   Pulse 60   Resp 18   Ht 5' 4.5" (1.638 m)   Wt 142 lb 9.6 oz (64.7 kg)   BMI 24.10 kg/m    Physical Exam  Constitutional: She is oriented to person, place, and time. She appears  well-developed and well-nourished.  HENT:  Head: Normocephalic and atraumatic.  Eyes: Conjunctivae and EOM are normal.  Neck: Normal range of motion.  Cardiovascular: Normal rate, regular rhythm, normal heart sounds and intact distal pulses.  Pulmonary/Chest: Effort normal and breath sounds normal.  Abdominal: Soft. Bowel sounds are normal.  Lymphadenopathy:    She has no cervical adenopathy.  Neurological: She is alert and oriented to person, place, and time.  Skin: Skin is warm and dry. Capillary refill takes less than 2 seconds.  Psychiatric: She has a normal mood and affect. Her behavior is normal.  Nursing note and vitals reviewed.    Musculoskeletal Exam: C-spine good ROM. Thoracic kyphosis noted.  Limited ROM of lumbar spine. No midline spinal tenderness.  No SI joint tenderness.  Right sternoclavicular joint thickening but not tenderness. Shoulder joints and elbow joints good ROM.  Right forearm is in a splint.  Tenosynovitis of left wrist present.  She has synovial thickening of all MCP joints.  Mild synovitis of left 3rd MCP.  Right knee replacement mildly warm.  Left knee no warmth or effusion. No tenderness or swelling of ankle joints.   CDAI Exam: CDAI Score: 4.4  Patient Global Assessment: 2 (mm); Provider Global Assessment: 2 (mm) Swollen: 2 ; Tender: 2  Joint Exam      Right  Left  Wrist     Swollen Tender  MCP 3     Swollen Tender     Investigation: No additional findings.  Imaging: No results found.  Recent Labs: Lab Results  Component Value Date   WBC 5.1 12/05/2017   HGB 9.6 (L) 12/05/2017   PLT 216 12/05/2017   NA 139 12/05/2017   K 4.0 12/05/2017   CL 104 12/05/2017   CO2 25 12/05/2017   GLUCOSE 119 (H) 12/05/2017   BUN 28 (H) 12/05/2017   CREATININE 1.32 (H) 12/05/2017   BILITOT 0.7 12/05/2017   ALKPHOS 105 12/05/2017   AST 19 12/05/2017   ALT 13 12/05/2017   PROT 8.2 (H) 12/05/2017   ALBUMIN 3.4 (L) 12/05/2017   CALCIUM 9.0 12/05/2017    GFRAA 43 (L) 12/05/2017    Speciality Comments: PLQ eye exam: 01/08/2017 Normal. [employer]. Follow up in 6 months.  Procedures:  No procedures performed Allergies: Macrobid [nitrofurantoin macrocrystal]   Assessment / Plan:     Visit Diagnoses: Rheumatoid arthritis involving multiple sites with positive rheumatoid factor (HCC) - +RF, +CCP, Her methotrexate was discontinued due to low GFR: She has mild tenosynovitis of the left wrist and synovitis of the left 3rd MCP joint. She had no tenderness on exam.  Overall she is doing well on current treatment regimen of PLQ 200 mg 1 tablet by mouth daily Monday through Friday. She will follow up in 5 months.  She was advised to notify us if she develops increased joint pain or joint swelling.   High risk  medication use - PLQ. eye exam: 01/08/2017. CBC and CMP were drawn today to monitor for drug toxicity.  - Plan: CBC with Differential/Platelet, COMPLETE METABOLIC PANEL WITH GFR  Rupture of extensor tendon of right hand, subsequent encounter - She had surgery by Dr. Burney Gauze in April 2019. She continues to wear the right arm splint for support.   Primary osteoarthritis of left knee: No warmth or effusion of left knee joint.  No discomfort with ROM.   Total knee replacement status, right - Dr. Aline Brochure: Mild warmth.  She has occasional discomfort.    Primary osteoarthritis of both feet: She has osteoarthritic changes in bilateral feet.  She wears proper fitting shoes.  She has no discomfort at this time.   Idiopathic chronic gout of multiple sites without tophus -She has not had any recent gout flares.  She is on allopurinol 100 mg by mouth daily. We will check uric acid level today. uric acid: 07/19/2016 13.0 - Plan: Uric acid  Other medical conditions are listed as follows:   Elevated sedimentation rate - ESR> 100 followed by oncologist    Chronic kidney disease (CKD) stage G3b/A1, moderately decreased glomerular filtration rate  (GFR) between 30-44 mL/min/1.73 square meter and albuminuria creatinine ratio less than 30 mg/g (HCC)  Chronic systolic heart failure ([place])  History of breast cancer - [place] with no recurrence.  History of anemia  Dyslipidemia   Orders: Orders Placed This Encounter  Procedures  . Uric acid  . CBC with Differential/Platelet  . COMPLETE METABOLIC PANEL WITH GFR   No orders of the defined types were placed in this encounter.     Follow-Up Instructions: Return for Rheumatoid arthritis, Osteoarthritis, Gout.   Ofilia Neas, PA-C I examined and evaluated the patient with Hazel Sams PA.  Patient had some synovitis on my examination today.  She does not want to change her therapy.  She is quite satisfied.  The plan of care was discussed as noted above.  Bo Merino, MD Note - This record has been created using Editor, commissioning.  Chart creation errors have been sought, but may not always  have been located. Such creation errors do not reflect on  the standard of medical care.

## 2018-02-12 DIAGNOSIS — H35373 Puckering of macula, bilateral: Secondary | ICD-10-CM | POA: Diagnosis not present

## 2018-02-12 DIAGNOSIS — H35353 Cystoid macular degeneration, bilateral: Secondary | ICD-10-CM | POA: Diagnosis not present

## 2018-02-12 DIAGNOSIS — H44113 Panuveitis, bilateral: Secondary | ICD-10-CM | POA: Diagnosis not present

## 2018-02-12 DIAGNOSIS — H353132 Nonexudative age-related macular degeneration, bilateral, intermediate dry stage: Secondary | ICD-10-CM | POA: Diagnosis not present

## 2018-02-13 ENCOUNTER — Encounter: Payer: Self-pay | Admitting: Rheumatology

## 2018-02-13 ENCOUNTER — Ambulatory Visit (INDEPENDENT_AMBULATORY_CARE_PROVIDER_SITE_OTHER): Payer: Medicare HMO | Admitting: Rheumatology

## 2018-02-13 VITALS — BP 110/54 | HR 60 | Resp 18 | Ht 64.5 in | Wt 142.6 lb

## 2018-02-13 DIAGNOSIS — S66811D Strain of other specified muscles, fascia and tendons at wrist and hand level, right hand, subsequent encounter: Secondary | ICD-10-CM | POA: Diagnosis not present

## 2018-02-13 DIAGNOSIS — Z96651 Presence of right artificial knee joint: Secondary | ICD-10-CM | POA: Diagnosis not present

## 2018-02-13 DIAGNOSIS — N183 Chronic kidney disease, stage 3 (moderate): Secondary | ICD-10-CM | POA: Diagnosis not present

## 2018-02-13 DIAGNOSIS — I5022 Chronic systolic (congestive) heart failure: Secondary | ICD-10-CM

## 2018-02-13 DIAGNOSIS — N1832 Chronic kidney disease, stage 3b: Secondary | ICD-10-CM

## 2018-02-13 DIAGNOSIS — Z862 Personal history of diseases of the blood and blood-forming organs and certain disorders involving the immune mechanism: Secondary | ICD-10-CM

## 2018-02-13 DIAGNOSIS — R700 Elevated erythrocyte sedimentation rate: Secondary | ICD-10-CM

## 2018-02-13 DIAGNOSIS — E785 Hyperlipidemia, unspecified: Secondary | ICD-10-CM

## 2018-02-13 DIAGNOSIS — M19071 Primary osteoarthritis, right ankle and foot: Secondary | ICD-10-CM | POA: Diagnosis not present

## 2018-02-13 DIAGNOSIS — M0579 Rheumatoid arthritis with rheumatoid factor of multiple sites without organ or systems involvement: Secondary | ICD-10-CM | POA: Diagnosis not present

## 2018-02-13 DIAGNOSIS — Z79899 Other long term (current) drug therapy: Secondary | ICD-10-CM

## 2018-02-13 DIAGNOSIS — M19072 Primary osteoarthritis, left ankle and foot: Secondary | ICD-10-CM

## 2018-02-13 DIAGNOSIS — Z853 Personal history of malignant neoplasm of breast: Secondary | ICD-10-CM

## 2018-02-13 DIAGNOSIS — M1712 Unilateral primary osteoarthritis, left knee: Secondary | ICD-10-CM

## 2018-02-13 DIAGNOSIS — M1A09X0 Idiopathic chronic gout, multiple sites, without tophus (tophi): Secondary | ICD-10-CM | POA: Diagnosis not present

## 2018-02-13 NOTE — Progress Notes (Signed)
Pharmacy Note  [name] is a 77 y.o. female presents for follow-up with Dr. Estanislado Pandy for  history of seropositive rheumatoid arthritis, osteoarthritis, and gout.  Patient reports having trouble paying for Plaquenil prescription and current insurance plan is moving medication to a higher tier next year.  Ran a test claim and it appears the patient is currently in the Medicare donut hole.  Prescription cost is $30 a month.  Informed patient there is no patient assistance program for Plaquenil as it is a generic medication.  There are grants available for rheumatoid arthritis patients but they are currently closed.    Specialty pharmacy with patient advocate, Apolonio Schneiders, was able to find a nonprofit pharmacy called [facility] that offers medications at a discounted price.  Information was given to patient's son about income requirements for program qualification.  Patient lives with her husband who works and is afraid she may not qualify.  Patient was also given good Rx coupon for co-pay assistance in the meantime. Patient has enough medication to last through the end of November.    Encouraged patient and son to consult with seniors health insurance information program for Medicare plan selection assistance in order to find a plan that better meets patient's needs.  Patient will review [facility] program and give [place] a call if she wants to move forward.  All questions encouraged and answered.  Mariella Saa, PharmD, [facility] Rheumatology Clinical Pharmacist  02/13/2018 11:19 AM

## 2018-02-14 LAB — COMPLETE METABOLIC PANEL WITH GFR
AG RATIO: 1 (calc) (ref 1.0–2.5)
ALT: 12 U/L (ref 6–29)
AST: 16 U/L (ref 10–35)
Albumin: 4 g/dL (ref 3.6–5.1)
Alkaline phosphatase (APISO): 123 U/L (ref 33–130)
BILIRUBIN TOTAL: 0.4 mg/dL (ref 0.2–1.2)
BUN/Creatinine Ratio: 25 (calc) — ABNORMAL HIGH (ref 6–22)
BUN: 27 mg/dL — ABNORMAL HIGH (ref 7–25)
CHLORIDE: 101 mmol/L (ref 98–110)
CO2: 25 mmol/L (ref 20–32)
Calcium: 9.7 mg/dL (ref 8.6–10.4)
Creat: 1.07 mg/dL — ABNORMAL HIGH (ref 0.60–0.93)
GFR, Est African American: 57 mL/min/{1.73_m2} — ABNORMAL LOW (ref >=60)
GFR, Est Non African American: 49 mL/min/{1.73_m2} — ABNORMAL LOW (ref >=60)
GLOBULIN: 4.2 g/dL — AB (ref 1.9–3.7)
Glucose, Bld: 86 mg/dL (ref 65–99)
POTASSIUM: 4.3 mmol/L (ref 3.5–5.3)
SODIUM: 140 mmol/L (ref 135–146)
Total Protein: 8.2 g/dL — ABNORMAL HIGH (ref 6.1–8.1)

## 2018-02-14 LAB — CBC WITH DIFFERENTIAL/PLATELET
BASOS PCT: 0 %
Basophils Absolute: 0 cells/uL (ref 0–200)
EOS PCT: 0 %
Eosinophils Absolute: 0 cells/uL — ABNORMAL LOW (ref 15–500)
HEMATOCRIT: 31.3 % — AB (ref 35.0–45.0)
HEMOGLOBIN: 9.9 g/dL — AB (ref 11.7–15.5)
LYMPHS ABS: 1066 {cells}/uL (ref 850–3900)
MCH: 25.8 pg — ABNORMAL LOW (ref 27.0–33.0)
MCHC: 31.6 g/dL — ABNORMAL LOW (ref 32.0–36.0)
MCV: 81.5 fL (ref 80.0–100.0)
MPV: 10.9 fL (ref 7.5–12.5)
Monocytes Relative: 11.2 %
NEUTROS ABS: 3552 {cells}/uL (ref 1500–7800)
NEUTROS PCT: 68.3 %
Platelets: 217 10*3/uL (ref 140–400)
RBC: 3.84 10*6/uL (ref 3.80–5.10)
RDW: 16.8 % — ABNORMAL HIGH (ref 11.0–15.0)
Total Lymphocyte: 20.5 %
WBC: 5.2 10*3/uL (ref 3.8–10.8)
WBCMIX: 582 {cells}/uL (ref 200–950)

## 2018-02-14 LAB — URIC ACID: Uric Acid, Serum: 5.2 mg/dL (ref 2.5–7.0)

## 2018-02-17 NOTE — Progress Notes (Signed)
Uric acid is within desirable range.  CBC and CMP are stable.  Please forward labs to PCP.

## 2018-02-24 ENCOUNTER — Ambulatory Visit (INDEPENDENT_AMBULATORY_CARE_PROVIDER_SITE_OTHER): Payer: Medicare HMO | Admitting: *Deleted

## 2018-02-24 DIAGNOSIS — I255 Ischemic cardiomyopathy: Secondary | ICD-10-CM

## 2018-02-24 DIAGNOSIS — I5022 Chronic systolic (congestive) heart failure: Secondary | ICD-10-CM

## 2018-02-24 DIAGNOSIS — Z95810 Presence of automatic (implantable) cardiac defibrillator: Secondary | ICD-10-CM | POA: Diagnosis not present

## 2018-02-24 NOTE — Progress Notes (Signed)
Remote ICD transmission.   

## 2018-02-25 NOTE — Progress Notes (Signed)
EPIC Encounter for ICM Monitoring  Patient Name: [name] is a 77 y.o. female Date: 02/25/2018 Primary Care Physican: Fanta, [name], MD Primary Cardiologist:Koneswaran/Lawrence NP Electrophysiologist: Taylor Dry Weight:unknown Bi-V Pacing: 96.6%  Attemptedto Robert Blackwell, nephew and unable to reach.  Left detailed message, per DPR, regarding transmission.  Transmission reviewed.    Thoracic impedance normal.   Prescribed: Furosemide 40 mg 1 tablet daily.  Labs: 02/13/2018 Creatinine 1.07, BUN 27, Potassium 4.3, Sodium 140, eGFR 49-57 12/05/2017 Creatinine 1.32, BUN 28, Potassium 4.0, Sodium 139, EGFR 37-43 08/28/2017 Creatinine 1.24, BUN 30, Potassium 3.6, Sodium 138, EGFR 41-47 08/15/2017 Potassium 3.6, Sodium142 08/10/2017 Creatinine2.19, BUN61, Potassium4.5, Sodium142, EGFR20-24  08/09/2017 Creatinine2.88, BUN71, Potassium5.4, Sodium139, EGFR15-17, 10:09 PM results 08/09/2017 Creatinine2.73, BUN65, Potassium6.1, Sodium135, EGFR16-18, 03:06 PM results  Recommendations: Left voice mail with ICM number and encouraged to call if experiencing any fluid symptoms.  Follow-up plan: ICM clinic phone appointment on 04/01/2018.   Office appointment scheduled 04/07/2018 with Dr. Koneswaran.    Copy of ICM check sent to Dr. Taylor.   3 month ICM trend: 02/25/2018    1 Year ICM trend:       Laurie S Short, RN 02/25/2018 4:46 PM   

## 2018-02-27 ENCOUNTER — Encounter: Payer: Self-pay | Admitting: Cardiology

## 2018-03-04 ENCOUNTER — Encounter: Payer: Self-pay | Admitting: Rheumatology

## 2018-03-04 DIAGNOSIS — H353131 Nonexudative age-related macular degeneration, bilateral, early dry stage: Secondary | ICD-10-CM | POA: Diagnosis not present

## 2018-03-04 DIAGNOSIS — Z79899 Other long term (current) drug therapy: Secondary | ICD-10-CM | POA: Diagnosis not present

## 2018-03-04 DIAGNOSIS — H35373 Puckering of macula, bilateral: Secondary | ICD-10-CM | POA: Diagnosis not present

## 2018-03-04 DIAGNOSIS — H35353 Cystoid macular degeneration, bilateral: Secondary | ICD-10-CM | POA: Diagnosis not present

## 2018-03-10 DIAGNOSIS — J8410 Pulmonary fibrosis, unspecified: Secondary | ICD-10-CM | POA: Diagnosis not present

## 2018-03-10 DIAGNOSIS — I5022 Chronic systolic (congestive) heart failure: Secondary | ICD-10-CM | POA: Diagnosis not present

## 2018-03-10 DIAGNOSIS — I10 Essential (primary) hypertension: Secondary | ICD-10-CM | POA: Diagnosis not present

## 2018-03-10 DIAGNOSIS — M069 Rheumatoid arthritis, unspecified: Secondary | ICD-10-CM | POA: Diagnosis not present

## 2018-03-11 ENCOUNTER — Other Ambulatory Visit (HOSPITAL_COMMUNITY): Payer: Medicare HMO

## 2018-03-11 ENCOUNTER — Other Ambulatory Visit: Payer: Self-pay | Admitting: Rheumatology

## 2018-03-11 DIAGNOSIS — M0579 Rheumatoid arthritis with rheumatoid factor of multiple sites without organ or systems involvement: Secondary | ICD-10-CM

## 2018-03-11 NOTE — Telephone Encounter (Addendum)
Last visit: 02/13/18 Next Visit: 07/17/18 Labs: 02/13/18 stable PLQ eye exam: 01/08/2017 Normal.   Left message to advise patient needs updated PLQ eye exam.   Okay to refill 30 day supply per Dr. Estanislado Pandy

## 2018-03-13 ENCOUNTER — Telehealth: Payer: Self-pay | Admitting: Rheumatology

## 2018-03-13 NOTE — Telephone Encounter (Signed)
Patient's nephew [name] advised we need patient to update her PLQ eye exam. He states that patient has been to the eye doctor within the last month, he will contact their office to see if PLQ eye exam was completed, if not he will schedule and appointment,

## 2018-03-13 NOTE — Telephone Encounter (Signed)
[name], patient's nephew called you in reference to message you left patient. Please call nephew when available.

## 2018-03-18 ENCOUNTER — Ambulatory Visit (HOSPITAL_COMMUNITY): Payer: Medicare HMO | Admitting: Hematology

## 2018-03-24 ENCOUNTER — Other Ambulatory Visit (HOSPITAL_COMMUNITY): Payer: Self-pay | Admitting: Internal Medicine

## 2018-03-24 ENCOUNTER — Ambulatory Visit (HOSPITAL_COMMUNITY)
Admission: RE | Admit: 2018-03-24 | Discharge: 2018-03-24 | Disposition: A | Discharge status: Home / Self Care | Payer: Medicare HMO | Source: Ambulatory Visit | Attending: Internal Medicine | Admitting: Internal Medicine

## 2018-03-24 ENCOUNTER — Encounter (HOSPITAL_COMMUNITY): Payer: Self-pay

## 2018-03-24 ENCOUNTER — Ambulatory Visit (HOSPITAL_COMMUNITY): Payer: Medicare HMO

## 2018-03-24 ENCOUNTER — Inpatient Hospital Stay (HOSPITAL_COMMUNITY): Payer: Medicare HMO | Attending: Hematology

## 2018-03-24 DIAGNOSIS — D509 Iron deficiency anemia, unspecified: Secondary | ICD-10-CM | POA: Diagnosis not present

## 2018-03-24 DIAGNOSIS — D638 Anemia in other chronic diseases classified elsewhere: Secondary | ICD-10-CM | POA: Diagnosis not present

## 2018-03-24 DIAGNOSIS — Z853 Personal history of malignant neoplasm of breast: Secondary | ICD-10-CM | POA: Diagnosis not present

## 2018-03-24 DIAGNOSIS — Z1231 Encounter for screening mammogram for malignant neoplasm of breast: Secondary | ICD-10-CM | POA: Insufficient documentation

## 2018-03-24 LAB — COMPREHENSIVE METABOLIC PANEL
ALT: 19 U/L (ref 0–44)
ANION GAP: 7 (ref 5–15)
AST: 20 U/L (ref 15–41)
Albumin: 3.4 g/dL — ABNORMAL LOW (ref 3.5–5.0)
Alkaline Phosphatase: 97 U/L (ref 38–126)
BUN: 29 mg/dL — ABNORMAL HIGH (ref 8–23)
CHLORIDE: 106 mmol/L (ref 98–111)
CO2: 26 mmol/L (ref 22–32)
Calcium: 8.8 mg/dL — ABNORMAL LOW (ref 8.9–10.3)
Creatinine, Ser: 1.17 mg/dL — ABNORMAL HIGH (ref 0.44–1.00)
GFR calc non Af Amer: 44 mL/min — ABNORMAL LOW (ref >60)
GFR, EST AFRICAN AMERICAN: 51 mL/min — AB (ref >60)
Glucose, Bld: 94 mg/dL (ref 70–99)
POTASSIUM: 4.6 mmol/L (ref 3.5–5.1)
SODIUM: 139 mmol/L (ref 135–145)
Total Bilirubin: 0.3 mg/dL (ref 0.3–1.2)
Total Protein: 8.2 g/dL — ABNORMAL HIGH (ref 6.5–8.1)

## 2018-03-24 LAB — FERRITIN: Ferritin: 353 ng/mL — ABNORMAL HIGH (ref 11–307)

## 2018-03-24 LAB — CBC WITH DIFFERENTIAL/PLATELET
ABS IMMATURE GRANULOCYTES: 0.02 10*3/uL (ref 0.00–0.07)
BASOS ABS: 0 10*3/uL (ref 0.0–0.1)
Basophils Relative: 1 %
Eosinophils Absolute: 0 10*3/uL (ref 0.0–0.5)
Eosinophils Relative: 1 %
HEMATOCRIT: 35.3 % — AB (ref 36.0–46.0)
HEMOGLOBIN: 10.6 g/dL — AB (ref 12.0–15.0)
Immature Granulocytes: 0 %
LYMPHS ABS: 1.2 10*3/uL (ref 0.7–4.0)
LYMPHS PCT: 25 %
MCH: 26.2 pg (ref 26.0–34.0)
MCHC: 30 g/dL (ref 30.0–36.0)
MCV: 87.4 fL (ref 80.0–100.0)
MONO ABS: 0.8 10*3/uL (ref 0.1–1.0)
Monocytes Relative: 16 %
NEUTROS ABS: 2.7 10*3/uL (ref 1.7–7.7)
NRBC: 0 % (ref 0.0–0.2)
Neutrophils Relative %: 57 %
Platelets: 207 10*3/uL (ref 150–400)
RBC: 4.04 MIL/uL (ref 3.87–5.11)
RDW: 18.8 % — ABNORMAL HIGH (ref 11.5–15.5)
WBC: 4.8 10*3/uL (ref 4.0–10.5)

## 2018-03-24 LAB — LACTATE DEHYDROGENASE: LDH: 272 U/L — AB (ref 98–192)

## 2018-03-31 ENCOUNTER — Ambulatory Visit (HOSPITAL_COMMUNITY): Payer: Medicare HMO | Admitting: Hematology

## 2018-04-01 ENCOUNTER — Telehealth: Payer: Self-pay | Admitting: Cardiology

## 2018-04-01 NOTE — Telephone Encounter (Signed)
LMOVM reminding pt to send remote transmission.   

## 2018-04-07 ENCOUNTER — Ambulatory Visit (INDEPENDENT_AMBULATORY_CARE_PROVIDER_SITE_OTHER): Payer: Medicare HMO | Admitting: Cardiovascular Disease

## 2018-04-07 ENCOUNTER — Encounter: Payer: Self-pay | Admitting: Cardiovascular Disease

## 2018-04-07 VITALS — BP 118/60 | HR 88 | Ht 65.0 in | Wt 146.0 lb

## 2018-04-07 DIAGNOSIS — Z95810 Presence of automatic (implantable) cardiac defibrillator: Secondary | ICD-10-CM

## 2018-04-07 DIAGNOSIS — I25118 Atherosclerotic heart disease of native coronary artery with other forms of angina pectoris: Secondary | ICD-10-CM

## 2018-04-07 DIAGNOSIS — N183 Chronic kidney disease, stage 3 unspecified: Secondary | ICD-10-CM

## 2018-04-07 DIAGNOSIS — I5042 Chronic combined systolic (congestive) and diastolic (congestive) heart failure: Secondary | ICD-10-CM

## 2018-04-07 DIAGNOSIS — I10 Essential (primary) hypertension: Secondary | ICD-10-CM

## 2018-04-07 NOTE — Patient Instructions (Signed)
Medication Instructions:  Your physician recommends that you continue on your current medications as directed. Please refer to the Current Medication list given to you today.  If you need a refill on your cardiac medications before your next appointment, please call your pharmacy.   Lab work: NONE   If you have labs (blood work) drawn today and your tests are completely normal, you will receive your results only by: . MyChart Message (if you have MyChart) OR . A paper copy in the mail If you have any lab test that is abnormal or we need to change your treatment, we will call you to review the results.  Testing/Procedures: NONE   Follow-Up: At [facility], you and your health needs are our priority.  As part of our continuing mission to provide you with exceptional heart care, we have created designated Provider Care Teams.  These Care Teams include your primary Cardiologist (physician) and Advanced Practice Providers (APPs -  Physician Assistants and Nurse Practitioners) who all work together to provide you with the care you need, when you need it. You will need a follow up appointment in 6 months.  Please call our office 2 months in advance to schedule this appointment.  You may see Suresh Koneswaran, MD or one of the following Advanced Practice Providers on your designated Care Team:   Brittany Strader, PA-C ([place] Office) . Michele Lenze, PA-C ([place] Office)  Any Other Special Instructions Will Be Listed Below (If Applicable). Thank you for choosing [facility]!     

## 2018-04-07 NOTE — Progress Notes (Signed)
SUBJECTIVE: The patient presents for routine follow-up. She has a history of chronic systolic heart failure and has a BiVICD. She also has coronary artery disease with a remote history of myocardial infarction.  Echocardiogram 07/01/15 showed moderately reduced left ventricular systolic function, LVEF 16-38%, mild LVH, grade 1 diastolic dysfunction with elevated filling pressures, and mild mitral and tricuspid regurgitation.  The patient denies any symptoms of chest pain, palpitations, shortness of breath, lightheadedness, dizziness, leg swelling, orthopnea, PND, and syncope.  Thoracic impedance was normal on 02/24/18.      Review of Systems: As per "subjective", otherwise negative.  Allergies  Allergen Reactions  . Macrobid [Nitrofurantoin Macrocrystal] Hives and Itching    Current Outpatient Medications  Medication Sig Dispense Refill  . acetaminophen (TYLENOL) 500 MG tablet Take 500 mg by mouth every 6 (six) hours as needed (FOR PAIN/HEADACHES).    [name] allopurinol (ZYLOPRIM) 100 MG tablet TAKE 1 TABLET (100 MG TOTAL) BY MOUTH DAILY. 90 tablet 0  . carvedilol (COREG) 6.25 MG tablet TAKE 1 TABLET BY MOUTH TWICE A DAY WITH MEALS 60 tablet 3  . colchicine (COLCRYS) 0.6 MG tablet Take 0.6 mg by mouth daily.    . furosemide (LASIX) 40 MG tablet Take 40 mg by mouth daily.     . hydroxychloroquine (PLAQUENIL) 200 MG tablet TAKE 1 TABLET BY MOUTH DAILY. MONDAY THRU FRIDAY 40 tablet 0  . KLOR-CON M20 20 MEQ tablet     . lovastatin (MEVACOR) 40 MG tablet TAKE 1 TABLET (40 MG TOTAL) BY MOUTH AT BEDTIME. 30 tablet 6  . Magnesium 500 MG CAPS Take 500 mg by mouth at bedtime.    . pantoprazole (PROTONIX) 20 MG tablet Take 1 tablet (20 mg total) by mouth daily. (Patient taking differently: Take 20 mg by mouth 2 (two) times daily. ) 90 tablet 3   No current facility-administered medications for this visit.     Past Medical History:  Diagnosis Date  . Anemia    Hgb of 9-10  . Anemia of  chronic disease    Hgb of 9-10 chronically; 06/2010: H&H-10.7/33.5, MCV-81, normal iron studies in 2010   . Arteriosclerotic cardiovascular disease (ASCVD)    Remote PTCA by patient report; LBBB; associated cardiomyopathy, presumed ischemic with EF 40-45% previously, 20% in 06/2009; h/o clinical congestive heart failure; negative stress nuclear in 2009 with inferoseptal and apical scar  . Automatic implantable cardioverter-defibrillator in situ   . Breast cancer ([place])    breast  . Chronic bronchitis ([place])   . Chronic renal disease, stage 3, moderately decreased glomerular filtration rate (GFR) between 30-59 mL/min/1.73 square meter (HCC) 02/01/2016  . Congestive heart failure (CHF) ([place])   . Elevated cholesterol   . Elevated sed rate   . GERD (gastroesophageal reflux disease)   . GI bleed   . Gout   . HOH (hard of hearing)   . Hyperlipidemia   . Hypertension   . Rheumatoid arthritis ([place])   . UTI (urinary tract infection)     Past Surgical History:  Procedure Laterality Date  . BI-VENTRICULAR IMPLANTABLE CARDIOVERTER DEFIBRILLATOR N/A 07/28/2012   Procedure: BI-VENTRICULAR IMPLANTABLE CARDIOVERTER DEFIBRILLATOR  (CRT-D);  Surgeon: Evans Lance, MD;  Location: [facility] CATH LAB;  Service: Cardiovascular;  Laterality: N/A;  . BI-VENTRICULAR IMPLANTABLE CARDIOVERTER DEFIBRILLATOR  (CRT-D)  07/28/2012  . BREAST BIOPSY Bilateral   . CATARACT EXTRACTION W/ INTRAOCULAR LENS IMPLANT Left   . CATARACT EXTRACTION W/PHACO Right 09/15/2013   Procedure: CATARACT EXTRACTION PHACO AND  INTRAOCULAR LENS PLACEMENT (IOC);  Surgeon: Elta Guadeloupe T. Gershon Crane, MD;  Location: [facility] ORS;  Service: Ophthalmology;  Laterality: Right;  CDE:  10.74  . COLONOSCOPY N/A 03/04/2015   Procedure: COLONOSCOPY;  Surgeon: Rogene Houston, MD;  Location: [facility] ENDO SUITE;  Service: Endoscopy;  Laterality: N/A;  10:50   . ESOPHAGOGASTRODUODENOSCOPY N/A 03/04/2015   Procedure: ESOPHAGOGASTRODUODENOSCOPY (EGD);  Surgeon: Rogene Houston, MD;   Location: [facility] ENDO SUITE;  Service: Endoscopy;  Laterality: N/A;  . ESOPHAGOGASTRODUODENOSCOPY N/A 09/21/2016   Procedure: ESOPHAGOGASTRODUODENOSCOPY (EGD);  Surgeon: Rogene Houston, MD;  Location: [facility] ENDO SUITE;  Service: Endoscopy;  Laterality: N/A;  . GIVENS CAPSULE STUDY N/A 09/22/2016   Procedure: GIVENS CAPSULE STUDY;  Surgeon: Rogene Houston, MD;  Location: [facility] END[facility];  Service: Endoscopy;  Laterality: N/A;  . MASTECTOMY Left 1998  . TENDON TRANSFER Right 08/15/2017   Procedure: TENDON TRANSFER RIGHT WRIST EXTENSORS AS NEEDED, ULNA RESECTION AND STABILIZATION;  Surgeon: Charlotte Crumb, MD;  Location: [place];  Service: Orthopedics;  Laterality: Right;  . TOTAL KNEE ARTHROPLASTY Right    Dr.Harrison  . TUBAL LIGATION      Social History   Socioeconomic History  . Marital status: Married    Spouse name: Not on file  . Number of children: Not on file  . Years of education: Not on file  . Highest education level: Not on file  Occupational History  . Occupation: Retired  Scientific laboratory technician  . Financial resource strain: Not very hard  . Food insecurity:    Worry: Never true    Inability: Never true  . Transportation needs:    Medical: No    Non-medical: No  Tobacco Use  . Smoking status: Former Smoker    Packs/day: 0.25    Years: 30.00    Pack years: 7.50    Types: Cigarettes    Start date: 09/17/1956    Last attempt to quit: 04/24/2007    Years since quitting: 10.9  . Smokeless tobacco: Current User    Types: Chew  . Tobacco comment: 07/03/2013 "smoked some; don't know how much or how long or when I quit"  Substance and Sexual Activity  . Alcohol use: No    Alcohol/week: 0.0 standard drinks  . Drug use: No  . Sexual activity: Not Currently    Birth control/protection: Post-menopausal  Lifestyle  . Physical activity:    Days per week: Not on file    Minutes per session: Not on file  . Stress: Not on file  Relationships  . Social connections:    Talks on phone: Not on  file    Gets together: Not on file    Attends religious service: Not on file    Active member of club or organization: Not on file    Attends meetings of clubs or organizations: Not on file    Relationship status: Not on file  . Intimate partner violence:    Fear of current or ex partner: No    Emotionally abused: No    Physically abused: No    Forced sexual activity: No  Other Topics Concern  . Not on file  Social History Narrative   Married, lives with spouse   1 daughter, died in 42 in a car accident   Retired - worked in [employer] fundraiser   No recent travel     [place]:   04/07/18 1342  BP: 118/60  Pulse: 88  SpO2: 97%  Weight: 146 lb (66.2 kg)  Height: 5'  5" (1.651 m)    Wt Readings from Last 3 Encounters:  04/07/18 146 lb (66.2 kg)  02/13/18 142 lb 9.6 oz (64.7 kg)  01/22/18 143 lb (64.9 kg)     PHYSICAL EXAM General: NAD HEENT: Normal. Neck: No JVD, no thyromegaly. Lungs: Clear to auscultation bilaterally with normal respiratory effort. CV: Regular rate and rhythm, normal S1/S2, no Y4/I3, 2/6 systolicmurmurheard throughout precordium. No pretibial or periankle edema.   Abdomen: Soft, nontender, no distention.  Neurologic: Alert and oriented.  Psych: Normal affect. Skin: Normal. Musculoskeletal: No gross deformities.    ECG: Reviewed above under Subjective   Labs: Lab Results  Component Value Date/Time   K 4.6 03/24/2018 12:58 PM   BUN 29 (H) 03/24/2018 12:58 PM   CREATININE 1.17 (H) 03/24/2018 12:58 PM   CREATININE 1.07 (H) 02/13/2018 11:42 AM   ALT 19 03/24/2018 12:58 PM   TSH 0.501 07/03/2013 06:40 PM   HGB 10.6 (L) 03/24/2018 12:58 PM     Lipids: Lab Results  Component Value Date/Time   LDLCALC 83 05/23/2012 09:05 AM   CHOL 134 05/23/2012 09:05 AM   TRIG 103 05/23/2012 09:05 AM   HDL 30 (L) 05/23/2012 09:05 AM       ASSESSMENT AND PLAN:  1. Chronic combined systolic and diastolic heart failure: Euvolemic and stable. Weight is up to  146 pounds. Continue carvedilol and Lasix 40 mg daily. H/o stage IV CKD in past, currently stage III.  Could consider reintroducing ACEI vs ARB. I am not inclined to try BiDil as I am not certain her BP would tolerate.  2. Coronary artery disease with remote history of MI: Symptomatically stable. Continue lovastatin and carvedilol at present doses. She is presumably not on aspirin due to anemia. Hgb 10.6 on 03/24/18.Most recent upper and small bowel endoscopy results previously reviewed. She had angiodysplastic lesions in the jejunum.  3. Biventricular ICD: Device interrogation reviewed with normal device function.   Thoracic impedance trends were stable.  She follows with Dr. Lovena Le.  4.  Chronic kidney disease stage III: BUN and creatinine on 03/24/18 were 29 and 1.17 respectively.   Disposition: Follow up 6 months   Kate Sable, M.D., F.A.C.C.

## 2018-04-15 NOTE — Progress Notes (Signed)
No ICM remote transmission received for 04/01/2018 and next ICM transmission scheduled for 05/19/2018.

## 2018-04-24 LAB — CUP PACEART REMOTE DEVICE CHECK
Battery Remaining Longevity: 31 mo
Battery Voltage: 2.95 V
Brady Statistic AP VP Percent: 30.41 %
Brady Statistic AP VS Percent: 1 %
Brady Statistic AS VP Percent: 67.35 %
Brady Statistic AS VS Percent: 1.24 %
Brady Statistic RA Percent Paced: 31.15 %
Brady Statistic RV Percent Paced: 16.75 %
Date Time Interrogation Session: 20191104092409
HighPow Impedance: 90 Ohm
Implantable Lead Implant Date: 20140407
Implantable Lead Implant Date: 20140407
Implantable Lead Implant Date: 20140407
Implantable Lead Location: 753858
Implantable Lead Location: 753860
Implantable Lead Model: 4194
Implantable Lead Model: 5076
Implantable Lead Model: 6935
Implantable Pulse Generator Implant Date: 20140407
Lead Channel Impedance Value: 342 Ohm
Lead Channel Impedance Value: 399 Ohm
Lead Channel Impedance Value: 513 Ohm
Lead Channel Impedance Value: 665 Ohm
Lead Channel Impedance Value: 893 Ohm
Lead Channel Pacing Threshold Amplitude: 0.5 V
Lead Channel Pacing Threshold Amplitude: 0.625 V
Lead Channel Pacing Threshold Amplitude: 1.125 V
Lead Channel Pacing Threshold Pulse Width: 0.4 ms
Lead Channel Pacing Threshold Pulse Width: 0.4 ms
Lead Channel Pacing Threshold Pulse Width: 0.5 ms
Lead Channel Sensing Intrinsic Amplitude: 0.875 mV
Lead Channel Sensing Intrinsic Amplitude: 0.875 mV
Lead Channel Sensing Intrinsic Amplitude: 31.625 mV
Lead Channel Sensing Intrinsic Amplitude: 31.625 mV
Lead Channel Setting Pacing Amplitude: 1.5 V
Lead Channel Setting Pacing Amplitude: 2 V
Lead Channel Setting Pacing Pulse Width: 0.4 ms
Lead Channel Setting Sensing Sensitivity: 0.45 mV
MDC IDC LEAD LOCATION: 753859
MDC IDC MSMT LEADCHNL RV IMPEDANCE VALUE: 399 Ohm
MDC IDC SET LEADCHNL LV PACING PULSEWIDTH: 0.5 ms
MDC IDC SET LEADCHNL RV PACING AMPLITUDE: 2.5 V

## 2018-05-05 ENCOUNTER — Other Ambulatory Visit: Payer: Self-pay | Admitting: Rheumatology

## 2018-05-05 DIAGNOSIS — M0579 Rheumatoid arthritis with rheumatoid factor of multiple sites without organ or systems involvement: Secondary | ICD-10-CM

## 2018-05-05 NOTE — Telephone Encounter (Signed)
ok 

## 2018-05-05 NOTE — Telephone Encounter (Addendum)
Last visit: 02/13/18 Next Visit: 07/17/18 Labs: 02/13/18 stable PLQ eye exam: 01/08/2017 Normal.  Left message to advise patient she is due to update PLQ eye exam.  Okay to refill 30 day supply PLQ?

## 2018-05-08 ENCOUNTER — Other Ambulatory Visit (HOSPITAL_COMMUNITY): Payer: Self-pay | Admitting: *Deleted

## 2018-05-08 DIAGNOSIS — D508 Other iron deficiency anemias: Secondary | ICD-10-CM

## 2018-05-09 ENCOUNTER — Other Ambulatory Visit: Payer: Self-pay

## 2018-05-09 ENCOUNTER — Inpatient Hospital Stay (HOSPITAL_BASED_OUTPATIENT_CLINIC_OR_DEPARTMENT_OTHER): Payer: Medicare Other | Admitting: Hematology

## 2018-05-09 ENCOUNTER — Inpatient Hospital Stay (HOSPITAL_COMMUNITY): Payer: Medicare Other | Attending: Hematology

## 2018-05-09 ENCOUNTER — Encounter (HOSPITAL_COMMUNITY): Payer: Self-pay | Admitting: Hematology

## 2018-05-09 VITALS — BP 142/57 | HR 87 | Temp 97.9°F | Resp 18 | Wt 147.0 lb

## 2018-05-09 DIAGNOSIS — D638 Anemia in other chronic diseases classified elsewhere: Secondary | ICD-10-CM

## 2018-05-09 DIAGNOSIS — Z87891 Personal history of nicotine dependence: Secondary | ICD-10-CM

## 2018-05-09 DIAGNOSIS — Z853 Personal history of malignant neoplasm of breast: Secondary | ICD-10-CM | POA: Insufficient documentation

## 2018-05-09 DIAGNOSIS — Z79899 Other long term (current) drug therapy: Secondary | ICD-10-CM | POA: Diagnosis not present

## 2018-05-09 DIAGNOSIS — D509 Iron deficiency anemia, unspecified: Secondary | ICD-10-CM | POA: Insufficient documentation

## 2018-05-09 DIAGNOSIS — Z9012 Acquired absence of left breast and nipple: Secondary | ICD-10-CM | POA: Insufficient documentation

## 2018-05-09 DIAGNOSIS — D508 Other iron deficiency anemias: Secondary | ICD-10-CM

## 2018-05-09 DIAGNOSIS — N183 Chronic kidney disease, stage 3 (moderate): Principal | ICD-10-CM

## 2018-05-09 DIAGNOSIS — N1832 Chronic kidney disease, stage 3b: Secondary | ICD-10-CM

## 2018-05-09 LAB — COMPREHENSIVE METABOLIC PANEL
ALBUMIN: 3.2 g/dL — AB (ref 3.5–5.0)
ALT: 12 U/L (ref 0–44)
AST: 18 U/L (ref 15–41)
Alkaline Phosphatase: 90 U/L (ref 38–126)
Anion gap: 12 (ref 5–15)
BILIRUBIN TOTAL: 0.4 mg/dL (ref 0.3–1.2)
BUN: 17 mg/dL (ref 8–23)
CO2: 24 mmol/L (ref 22–32)
Calcium: 9.2 mg/dL (ref 8.9–10.3)
Chloride: 101 mmol/L (ref 98–111)
Creatinine, Ser: 1.11 mg/dL — ABNORMAL HIGH (ref 0.44–1.00)
GFR calc Af Amer: 55 mL/min — ABNORMAL LOW (ref >60)
GFR calc non Af Amer: 47 mL/min — ABNORMAL LOW (ref >60)
Glucose, Bld: 109 mg/dL — ABNORMAL HIGH (ref 70–99)
POTASSIUM: 3.9 mmol/L (ref 3.5–5.1)
Sodium: 137 mmol/L (ref 135–145)
Total Protein: 8 g/dL (ref 6.5–8.1)

## 2018-05-09 LAB — IRON AND TIBC
Iron: 33 ug/dL (ref 28–170)
Saturation Ratios: 15 % (ref 10.4–31.8)
TIBC: 213 ug/dL — AB (ref 250–450)
UIBC: 180 ug/dL

## 2018-05-09 LAB — CBC WITH DIFFERENTIAL/PLATELET
Abs Immature Granulocytes: 0 10*3/uL (ref 0.00–0.07)
BASOS ABS: 0 10*3/uL (ref 0.0–0.1)
Basophils Relative: 1 %
Eosinophils Absolute: 0.1 10*3/uL (ref 0.0–0.5)
Eosinophils Relative: 2 %
HEMATOCRIT: 33.8 % — AB (ref 36.0–46.0)
Hemoglobin: 9.9 g/dL — ABNORMAL LOW (ref 12.0–15.0)
Immature Granulocytes: 0 %
LYMPHS ABS: 1.3 10*3/uL (ref 0.7–4.0)
Lymphocytes Relative: 28 %
MCH: 25.3 pg — ABNORMAL LOW (ref 26.0–34.0)
MCHC: 29.3 g/dL — ABNORMAL LOW (ref 30.0–36.0)
MCV: 86.2 fL (ref 80.0–100.0)
Monocytes Absolute: 0.7 10*3/uL (ref 0.1–1.0)
Monocytes Relative: 16 %
Neutro Abs: 2.4 10*3/uL (ref 1.7–7.7)
Neutrophils Relative %: 53 %
Platelets: 206 10*3/uL (ref 150–400)
RBC: 3.92 MIL/uL (ref 3.87–5.11)
RDW: 19.1 % — ABNORMAL HIGH (ref 11.5–15.5)
WBC: 4.5 10*3/uL (ref 4.0–10.5)
nRBC: 0 % (ref 0.0–0.2)

## 2018-05-09 LAB — LACTATE DEHYDROGENASE: LDH: 230 U/L — ABNORMAL HIGH (ref 98–192)

## 2018-05-09 LAB — VITAMIN B12: VITAMIN B 12: 683 pg/mL (ref 180–914)

## 2018-05-09 LAB — FERRITIN: Ferritin: 323 ng/mL — ABNORMAL HIGH (ref 11–307)

## 2018-05-09 LAB — FOLATE: Folate: 10.9 ng/mL (ref >5.9)

## 2018-05-09 NOTE — Patient Instructions (Addendum)
[facility] at [facility] Discharge Instructions Follow up in 4 months with labs    Thank you for choosing [facility] at [facility] to provide your oncology and hematology care.  To afford each patient quality time with our provider, please arrive at least 15 minutes before your scheduled appointment time.   If you have a lab appointment with the Cancer Center please come in thru the  Main Entrance and check in at the main information desk  You need to re-schedule your appointment should you arrive 10 or more minutes late.  We strive to give you quality time with our providers, and arriving late affects you and other patients whose appointments are after yours.  Also, if you no show three or more times for appointments you may be dismissed from the clinic at the providers discretion.     Again, thank you for choosing [facility].  Our hope is that these requests will decrease the amount of time that you wait before being seen by our physicians.       _____________________________________________________________  Should you have questions after your visit to [facility], please contact our office at [phone] between the hours of 8:00 a.m. and 4:30 p.m.  Voicemails left after 4:00 p.m. will not be returned until the following business day.  For prescription refill requests, have your pharmacy contact our office and allow 72 hours.    Cancer Center Support Programs:   > Cancer Support Group  2nd Tuesday of the month 1pm-2pm, Journey Room    

## 2018-05-09 NOTE — Assessment & Plan Note (Signed)
1.  Normocytic anemia: - This is from combination of CKD, relative iron deficiency, chronic GI blood loss from AVMs. - Last Feraheme infusion was on 03/19/2017. - Denies any bleeding per rectum or melena.  Has occasional dark stools. -We reviewed blood work from today.  Hemoglobin dropped to 9.9. -Ferritin was 323 and percent saturation was 15. -I will would recommend Feraheme infusion x1 to keep saturation between 20 and 30%. -If she does not show improvement after Feraheme infusion, we will consider erythropoiesis stimulating agents like Procrit if hemoglobin stays below 10.  #2 left breast cancer: -She had remote history of left mastectomy. -Right mammogram on 03/24/2018 was BI-RADS 1.

## 2018-05-09 NOTE — Progress Notes (Signed)
[name], [place] [address]   CLINIC:  Medical Oncology/Hematology  PCP:  [name], MD [place] [address] [phone]   REASON FOR VISIT: Follow-up for Iron deficiency anemia.   CURRENT THERAPY: Intermittent iron infusions.   BRIEF ONCOLOGIC HISTORY:    Breast cancer ([place])    Initial Diagnosis    Breast cancer      INTERVAL HISTORY:  [name] 78 y.o. female returns for routine follow-up for Iron deficiency anemia. She is doing well she has no complaints at this time. Denies any nausea, vomiting, or diarrhea. Denies any new pains. Had not noticed any recent bleeding such as epistaxis, hematuria or hematochezia. Denies recent chest pain on exertion, shortness of breath on minimal exertion, pre-syncopal episodes, or palpitations. Denies any numbness or tingling in hands or feet. Denies any recent fevers, infections, or recent hospitalizations. Patient reports appetite at 100% and energy level at 50%.   REVIEW OF SYSTEMS:  Review of Systems  Constitutional: Positive for fatigue.  All other systems reviewed and are negative.    PAST MEDICAL/SURGICAL HISTORY:  Past Medical History:  Diagnosis Date  . Anemia    Hgb of 9-10  . Anemia of chronic disease    Hgb of 9-10 chronically; 06/2010: H&H-10.7/33.5, MCV-81, normal iron studies in 2010   . Arteriosclerotic cardiovascular disease (ASCVD)    Remote PTCA by patient report; LBBB; associated cardiomyopathy, presumed ischemic with EF 40-45% previously, 20% in 06/2009; h/o clinical congestive heart failure; negative stress nuclear in 2009 with inferoseptal and apical scar  . Automatic implantable cardioverter-defibrillator in situ   . Breast cancer ([place])    breast  . Chronic bronchitis ([place])   . Chronic renal disease, stage 3, moderately decreased glomerular filtration rate (GFR) between 30-59 mL/min/1.73 square meter (HCC) 02/01/2016  . Congestive heart  failure (CHF) ([place])   . Elevated cholesterol   . Elevated sed rate   . GERD (gastroesophageal reflux disease)   . GI bleed   . Gout   . HOH (hard of hearing)   . Hyperlipidemia   . Hypertension   . Rheumatoid arthritis ([place])   . UTI (urinary tract infection)    Past Surgical History:  Procedure Laterality Date  . BI-VENTRICULAR IMPLANTABLE CARDIOVERTER DEFIBRILLATOR N/A 07/28/2012   Procedure: BI-VENTRICULAR IMPLANTABLE CARDIOVERTER DEFIBRILLATOR  (CRT-D);  Surgeon: Evans Lance, MD;  Location: [facility] CATH LAB;  Service: Cardiovascular;  Laterality: N/A;  . BI-VENTRICULAR IMPLANTABLE CARDIOVERTER DEFIBRILLATOR  (CRT-D)  07/28/2012  . BREAST BIOPSY Bilateral   . CATARACT EXTRACTION W/ INTRAOCULAR LENS IMPLANT Left   . CATARACT EXTRACTION W/PHACO Right 09/15/2013   Procedure: CATARACT EXTRACTION PHACO AND INTRAOCULAR LENS PLACEMENT (IOC);  Surgeon: Elta Guadeloupe T. Gershon Crane, MD;  Location: [facility] ORS;  Service: Ophthalmology;  Laterality: Right;  CDE:  10.74  . COLONOSCOPY N/A 03/04/2015   Procedure: COLONOSCOPY;  Surgeon: Rogene Houston, MD;  Location: [facility] ENDO SUITE;  Service: Endoscopy;  Laterality: N/A;  10:50   . ESOPHAGOGASTRODUODENOSCOPY N/A 03/04/2015   Procedure: ESOPHAGOGASTRODUODENOSCOPY (EGD);  Surgeon: Rogene Houston, MD;  Location: [facility] ENDO SUITE;  Service: Endoscopy;  Laterality: N/A;  . ESOPHAGOGASTRODUODENOSCOPY N/A 09/21/2016   Procedure: ESOPHAGOGASTRODUODENOSCOPY (EGD);  Surgeon: Rogene Houston, MD;  Location: [facility] END[facility] SU[facility];  Service: Endoscopy;  Laterality: N/A;  . GIVENS CAPSULE STUDY N/A 09/22/2016   Procedure: GIVENS CAPSULE STUDY;  Surgeon: Rogene Houston, MD;  Location: [facility];  Service: Endoscopy;  Laterality:  N/A;  . MASTECTOMY Left 1998  . TENDON TRANSFER Right 08/15/2017   Procedure: TENDON TRANSFER RIGHT WRIST EXTENSORS AS NEEDED, ULNA RESECTION AND STABILIZATION;  Surgeon: Charlotte Crumb, MD;  Location: [facility];  Service: Orthopedics;  Laterality: Right;  . TOTAL KNEE  ARTHROPLASTY Right    Dr.Harrison  . TUBAL LIGATION       SOCIAL HISTORY:  Social History   Socioeconomic History  . Marital status: Married    Spouse name: Not on file  . Number of children: Not on file  . Years of education: Not on file  . Highest education level: Not on file  Occupational History  . Occupation: Retired  Scientific laboratory technician  . Financial resource strain: Not very hard  . Food insecurity:    Worry: Never true    Inability: Never true  . Transportation needs:    Medical: No    Non-medical: No  Tobacco Use  . Smoking status: Former Smoker    Packs/day: 0.25    Years: 30.00    Pack years: 7.50    Types: Cigarettes    Start date: 09/17/1956    Last attempt to quit: 04/24/2007    Years since quitting: 11.0  . Smokeless tobacco: Current User    Types: Chew  . Tobacco comment: 07/03/2013 "smoked some; don't know how much or how long or when I quit"  Substance and Sexual Activity  . Alcohol use: No    Alcohol/week: 0.0 standard drinks  . Drug use: No  . Sexual activity: Not Currently    Birth control/protection: Post-menopausal  Lifestyle  . Physical activity:    Days per week: Not on file    Minutes per session: Not on file  . Stress: Not on file  Relationships  . Social connections:    Talks on phone: Not on file    Gets together: Not on file    Attends religious service: Not on file    Active member of club or organization: Not on file    Attends meetings of clubs or organizations: Not on file    Relationship status: Not on file  . Intimate partner violence:    Fear of current or ex partner: No    Emotionally abused: No    Physically abused: No    Forced sexual activity: No  Other Topics Concern  . Not on file  Social History Narrative   Married, lives with spouse   1 daughter, died in 60 in a car accident   Retired - worked in [employer] fundraiser   No recent travel    FAMILY HISTORY:  Family History  Problem Relation Age of Onset  . Diabetes Father     . Pancreatic cancer Father   . Hypertension Brother     CURRENT MEDICATIONS:  Outpatient Encounter Medications as of 05/09/2018  Medication Sig  . acetaminophen (TYLENOL) 500 MG tablet Take 500 mg by mouth every 6 (six) hours as needed (FOR PAIN/HEADACHES).  [name] allopurinol (ZYLOPRIM) 100 MG tablet TAKE 1 TABLET (100 MG TOTAL) BY MOUTH DAILY.  . carvedilol (COREG) 6.25 MG tablet TAKE 1 TABLET BY MOUTH TWICE A DAY WITH MEALS  . colchicine (COLCRYS) 0.6 MG tablet Take 0.6 mg by mouth daily.  . furosemide (LASIX) 40 MG tablet Take 40 mg by mouth daily.   . hydroxychloroquine (PLAQUENIL) 200 MG tablet TAKE 1 TABLET BY MOUTH DAILY. [place]  . KLOR-CON M20 20 MEQ tablet   . lovastatin (MEVACOR) 40 MG tablet TAKE 1  TABLET (40 MG TOTAL) BY MOUTH AT BEDTIME.  . Magnesium 500 MG CAPS Take 500 mg by mouth at bedtime.  . pantoprazole (PROTONIX) 20 MG tablet Take 1 tablet (20 mg total) by mouth daily. (Patient taking differently: Take 20 mg by mouth 2 (two) times daily. )   No facility-administered encounter medications on file as of 05/09/2018.     ALLERGIES:  Allergies  Allergen Reactions  . Macrobid [Nitrofurantoin Macrocrystal] Hives and Itching     PHYSICAL EXAM:  ECOG Performance status: 1  Vitals:   05/09/18 1140  BP: (!) 142/57  Pulse: 87  Resp: 18  Temp: 97.9 F (36.6 C)  SpO2: 97%   Filed Weights   05/09/18 1140  Weight: 147 lb (66.7 kg)    Physical Exam Constitutional:      Appearance: Normal appearance. She is normal weight.  Cardiovascular:     Rate and Rhythm: Normal rate and regular rhythm.     Heart sounds: Normal heart sounds.  Pulmonary:     Effort: Pulmonary effort is normal.     Breath sounds: Normal breath sounds.  Musculoskeletal: Normal range of motion.  Skin:    General: Skin is warm and dry.  Neurological:     Mental Status: She is alert and oriented to person, place, and time. Mental status is at baseline.  Psychiatric:        Mood and  Affect: Mood normal.        Behavior: Behavior normal.        Thought Content: Thought content normal.        Judgment: Judgment normal.      LABORATORY DATA:  I have reviewed the labs as listed.  CBC    Component Value Date/Time   WBC 4.5 05/09/2018 1042   RBC 3.92 05/09/2018 1042   HGB 9.9 (L) 05/09/2018 1042   HCT 33.8 (L) 05/09/2018 1042   PLT 206 05/09/2018 1042   MCV 86.2 05/09/2018 1042   MCH 25.3 (L) 05/09/2018 1042   MCHC 29.3 (L) 05/09/2018 1042   RDW 19.1 (H) 05/09/2018 1042   LYMPHSABS 1.3 05/09/2018 1042   MONOABS 0.7 05/09/2018 1042   EOSABS 0.1 05/09/2018 1042   BASOSABS 0.0 05/09/2018 1042   CMP Latest Ref Rng & Units 05/09/2018 03/24/2018 02/13/2018  Glucose 70 - 99 mg/dL 109(H) 94 86  BUN 8 - 23 mg/dL 17 29(H) 27(H)  Creatinine 0.44 - 1.00 mg/dL 1.11(H) 1.17(H) 1.07(H)  Sodium 135 - 145 mmol/L 137 139 140  Potassium 3.5 - 5.1 mmol/L 3.9 4.6 4.3  Chloride 98 - 111 mmol/L 101 106 101  CO2 22 - 32 mmol/L 24 26 25   Calcium 8.9 - 10.3 mg/dL 9.2 8.8(L) 9.7  Total Protein 6.5 - 8.1 g/dL 8.0 8.2(H) 8.2(H)  Total Bilirubin 0.3 - 1.2 mg/dL 0.4 0.3 0.4  Alkaline Phos 38 - 126 U/L 90 97 -  AST 15 - 41 U/L 18 20 16   ALT 0 - 44 U/L 12 19 12        DIAGNOSTIC IMAGING:  I have independently reviewed the scans and discussed with the patient.   I have reviewed Francene Finders, NP's note and agree with the documentation.  I personally performed a face-to-face visit, made revisions and my assessment and plan is as follows.    ASSESSMENT & PLAN:   Anemia of chronic disease 1.  Normocytic anemia: - This is from combination of CKD, relative iron deficiency, chronic GI blood loss from AVMs. - Last Feraheme  infusion was on 03/19/2017. - Denies any bleeding per rectum or melena.  Has occasional dark stools. -We reviewed blood work from today.  Hemoglobin dropped to 9.9. -Ferritin was 323 and percent saturation was 15. -I will would recommend Feraheme infusion x1 to  keep saturation between 20 and 30%. -If she does not show improvement after Feraheme infusion, we will consider erythropoiesis stimulating agents like Procrit if hemoglobin stays below 10.  #2 left breast cancer: -She had remote history of left mastectomy. -Right mammogram on 03/24/2018 was BI-RADS 1.      Orders placed this encounter:  Orders Placed This Encounter  Procedures  . Vitamin B12  . Folate  . Iron and TIBC  . CBC with Differential/Platelet  . Comprehensive metabolic panel  . Ferritin  . Iron and TIBC  . Vitamin B12  . Folate      Derek Jack, MD [place] [phone]

## 2018-05-19 ENCOUNTER — Ambulatory Visit (INDEPENDENT_AMBULATORY_CARE_PROVIDER_SITE_OTHER): Payer: Medicare Other

## 2018-05-19 DIAGNOSIS — I5042 Chronic combined systolic (congestive) and diastolic (congestive) heart failure: Secondary | ICD-10-CM | POA: Diagnosis not present

## 2018-05-19 DIAGNOSIS — Z95810 Presence of automatic (implantable) cardiac defibrillator: Secondary | ICD-10-CM | POA: Diagnosis not present

## 2018-05-19 DIAGNOSIS — I5022 Chronic systolic (congestive) heart failure: Secondary | ICD-10-CM

## 2018-05-22 LAB — CUP PACEART REMOTE DEVICE CHECK
Battery Remaining Longevity: 35 mo
Brady Statistic AP VP Percent: 19.15 %
Brady Statistic AP VS Percent: 0.82 %
Brady Statistic AS VP Percent: 78.61 %
Brady Statistic AS VS Percent: 1.42 %
Brady Statistic RA Percent Paced: 19.73 %
Brady Statistic RV Percent Paced: 3.94 %
Date Time Interrogation Session: 20200127083334
HighPow Impedance: 94 Ohm
Implantable Lead Implant Date: 20140407
Implantable Lead Implant Date: 20140407
Implantable Lead Location: 753858
Implantable Lead Location: 753859
Implantable Lead Location: 753860
Implantable Lead Model: 4194
Implantable Lead Model: 5076
Implantable Pulse Generator Implant Date: 20140407
Lead Channel Impedance Value: 342 Ohm
Lead Channel Impedance Value: 380 Ohm
Lead Channel Impedance Value: 380 Ohm
Lead Channel Impedance Value: 494 Ohm
Lead Channel Impedance Value: 665 Ohm
Lead Channel Impedance Value: 893 Ohm
Lead Channel Pacing Threshold Amplitude: 0.625 V
Lead Channel Pacing Threshold Amplitude: 0.625 V
Lead Channel Pacing Threshold Pulse Width: 0.4 ms
Lead Channel Pacing Threshold Pulse Width: 0.4 ms
Lead Channel Pacing Threshold Pulse Width: 0.5 ms
Lead Channel Sensing Intrinsic Amplitude: 0.75 mV
Lead Channel Sensing Intrinsic Amplitude: 0.75 mV
Lead Channel Sensing Intrinsic Amplitude: 31.625 mV
Lead Channel Sensing Intrinsic Amplitude: 31.625 mV
Lead Channel Setting Pacing Amplitude: 1.75 V
Lead Channel Setting Pacing Amplitude: 2 V
Lead Channel Setting Pacing Amplitude: 2.5 V
Lead Channel Setting Pacing Pulse Width: 0.4 ms
Lead Channel Setting Sensing Sensitivity: 0.45 mV
MDC IDC LEAD IMPLANT DT: 20140407
MDC IDC MSMT BATTERY VOLTAGE: 2.94 V
MDC IDC MSMT LEADCHNL RV PACING THRESHOLD AMPLITUDE: 1.125 V
MDC IDC SET LEADCHNL LV PACING PULSEWIDTH: 0.5 ms

## 2018-05-22 NOTE — Progress Notes (Signed)
EPIC Encounter for ICM Monitoring  Patient Name: [name] is a 78 y.o. female Date: 05/22/2018 Primary Care Physican: [name], MD Primary Cardiologist:Koneswaran/Lawrence NP Electrophysiologist: Lovena Le Dry Weight:unknown Bi-V Pacing: 96%   Call to [name], nephew.Heart failure questions reviewed.  He said patient is feeling fine.  Transmission reviewed.    Thoracic impedance normal.  Prescribed: Furosemide 40 mg 1 tablet daily.  Labs: 12/05/2017 Creatinine 1.32, BUN 28, Potassium 4.0, Sodium 139, EGFR 37-43 08/28/2017 Creatinine 1.24, BUN 30, Potassium 3.6, Sodium 138, EGFR 41-47 08/15/2017 Potassium 3.6, Sodium142 08/10/2017 Creatinine2.19, BUN61, Potassium4.5, Sodium142, [id number]  08/09/2017 Creatinine2.88, BUN71, Potassium5.4, Sodium139, [id number], 10:09 PM results 08/09/2017 Creatinine2.73, BUN65, Potassium6.1, Sodium135, [id number], 03:06 PM results  Recommendations: No changes and encouraged to call for any fluid symptoms  Follow-up plan: ICM clinic phone appointment on 06/23/2018.    Copy of ICM check sent to Dr. Lovena Le.   3 month ICM trend: 05/19/2018    1 Year ICM trend:       [name], RN 05/22/2018 2:52 PM

## 2018-05-26 ENCOUNTER — Ambulatory Visit (INDEPENDENT_AMBULATORY_CARE_PROVIDER_SITE_OTHER): Payer: Medicare Other

## 2018-05-26 DIAGNOSIS — I255 Ischemic cardiomyopathy: Secondary | ICD-10-CM

## 2018-05-26 DIAGNOSIS — I5022 Chronic systolic (congestive) heart failure: Secondary | ICD-10-CM | POA: Diagnosis not present

## 2018-05-27 DIAGNOSIS — H44113 Panuveitis, bilateral: Secondary | ICD-10-CM | POA: Diagnosis not present

## 2018-05-27 DIAGNOSIS — Z79899 Other long term (current) drug therapy: Secondary | ICD-10-CM | POA: Diagnosis not present

## 2018-05-27 DIAGNOSIS — H35363 Drusen (degenerative) of macula, bilateral: Secondary | ICD-10-CM | POA: Diagnosis not present

## 2018-05-27 DIAGNOSIS — H35353 Cystoid macular degeneration, bilateral: Secondary | ICD-10-CM | POA: Diagnosis not present

## 2018-05-29 LAB — CUP PACEART REMOTE DEVICE CHECK
Battery Voltage: 2.95 V
Brady Statistic AP VS Percent: 0.65 %
Brady Statistic AS VP Percent: 84.51 %
Brady Statistic AS VS Percent: 1.4 %
Brady Statistic RA Percent Paced: 13.97 %
Brady Statistic RV Percent Paced: 2.13 %
Date Time Interrogation Session: 20200203083327
HIGH POWER IMPEDANCE MEASURED VALUE: 93 Ohm
Implantable Lead Implant Date: 20140407
Implantable Lead Implant Date: 20140407
Implantable Lead Location: 753858
Implantable Lead Location: 753859
Implantable Lead Location: 753860
Implantable Lead Model: 5076
Implantable Pulse Generator Implant Date: 20140407
Lead Channel Impedance Value: 342 Ohm
Lead Channel Impedance Value: 380 Ohm
Lead Channel Impedance Value: 380 Ohm
Lead Channel Impedance Value: 513 Ohm
Lead Channel Impedance Value: 665 Ohm
Lead Channel Pacing Threshold Amplitude: 0.625 V
Lead Channel Pacing Threshold Amplitude: 0.625 V
Lead Channel Pacing Threshold Amplitude: 1.125 V
Lead Channel Pacing Threshold Pulse Width: 0.4 ms
Lead Channel Pacing Threshold Pulse Width: 0.4 ms
Lead Channel Pacing Threshold Pulse Width: 0.5 ms
Lead Channel Sensing Intrinsic Amplitude: 1 mV
Lead Channel Sensing Intrinsic Amplitude: 1 mV
Lead Channel Sensing Intrinsic Amplitude: 31.625 mV
Lead Channel Setting Pacing Amplitude: 2 V
Lead Channel Setting Pacing Amplitude: 2.5 V
Lead Channel Setting Pacing Pulse Width: 0.4 ms
Lead Channel Setting Sensing Sensitivity: 0.45 mV
MDC IDC LEAD IMPLANT DT: 20140407
MDC IDC MSMT BATTERY REMAINING LONGEVITY: 33 mo
MDC IDC MSMT LEADCHNL LV IMPEDANCE VALUE: 912 Ohm
MDC IDC MSMT LEADCHNL RV SENSING INTR AMPL: 31.625 mV
MDC IDC SET LEADCHNL LV PACING AMPLITUDE: 1.75 V
MDC IDC SET LEADCHNL LV PACING PULSEWIDTH: 0.5 ms
MDC IDC STAT BRADY AP VP PERCENT: 13.43 %

## 2018-06-04 NOTE — Progress Notes (Signed)
Remote ICD transmission.   

## 2018-06-05 ENCOUNTER — Other Ambulatory Visit (HOSPITAL_COMMUNITY): Payer: Self-pay | Admitting: Nurse Practitioner

## 2018-06-05 ENCOUNTER — Telehealth (HOSPITAL_COMMUNITY): Payer: Self-pay | Admitting: Nurse Practitioner

## 2018-06-05 NOTE — Telephone Encounter (Signed)
f °

## 2018-06-10 DIAGNOSIS — M069 Rheumatoid arthritis, unspecified: Secondary | ICD-10-CM | POA: Diagnosis not present

## 2018-06-10 DIAGNOSIS — Z1389 Encounter for screening for other disorder: Secondary | ICD-10-CM | POA: Diagnosis not present

## 2018-06-10 DIAGNOSIS — I5022 Chronic systolic (congestive) heart failure: Secondary | ICD-10-CM | POA: Diagnosis not present

## 2018-06-10 DIAGNOSIS — J8410 Pulmonary fibrosis, unspecified: Secondary | ICD-10-CM | POA: Diagnosis not present

## 2018-06-10 DIAGNOSIS — Z0001 Encounter for general adult medical examination with abnormal findings: Secondary | ICD-10-CM | POA: Diagnosis not present

## 2018-06-12 ENCOUNTER — Other Ambulatory Visit (HOSPITAL_COMMUNITY): Payer: Self-pay | Admitting: Nurse Practitioner

## 2018-06-12 ENCOUNTER — Encounter (HOSPITAL_COMMUNITY): Payer: Self-pay

## 2018-06-12 ENCOUNTER — Inpatient Hospital Stay (HOSPITAL_COMMUNITY): Payer: Medicare Other | Attending: Hematology

## 2018-06-12 VITALS — BP 115/45 | HR 65 | Temp 98.3°F | Resp 18

## 2018-06-12 DIAGNOSIS — D62 Acute posthemorrhagic anemia: Secondary | ICD-10-CM

## 2018-06-12 DIAGNOSIS — D638 Anemia in other chronic diseases classified elsewhere: Secondary | ICD-10-CM

## 2018-06-12 DIAGNOSIS — N183 Chronic kidney disease, stage 3 (moderate): Secondary | ICD-10-CM

## 2018-06-12 DIAGNOSIS — D508 Other iron deficiency anemias: Secondary | ICD-10-CM

## 2018-06-12 DIAGNOSIS — D509 Iron deficiency anemia, unspecified: Secondary | ICD-10-CM | POA: Insufficient documentation

## 2018-06-12 DIAGNOSIS — N1832 Chronic kidney disease, stage 3b: Secondary | ICD-10-CM

## 2018-06-12 DIAGNOSIS — D649 Anemia, unspecified: Secondary | ICD-10-CM

## 2018-06-12 MED ORDER — SODIUM CHLORIDE 0.9 % IV SOLN
510.0000 mg | Freq: Once | INTRAVENOUS | Status: AC
  Administered 2018-06-12: 510 mg via INTRAVENOUS
  Filled 2018-06-12: qty 17

## 2018-06-12 MED ORDER — SODIUM CHLORIDE 0.9 % IV SOLN: Freq: Once | INTRAVENOUS | Status: DC

## 2018-06-12 MED ORDER — SODIUM CHLORIDE 0.9% FLUSH: 10.0000 mL | Freq: Once | INTRAVENOUS | Status: DC | PRN

## 2018-06-12 MED ORDER — SODIUM CHLORIDE 0.9 % IV SOLN
INTRAVENOUS | Status: DC
  Administered 2018-06-12: 08:00:00 via INTRAVENOUS

## 2018-06-12 MED ORDER — SODIUM CHLORIDE 0.9% FLUSH
10.0000 mL | Freq: Once | INTRAVENOUS | Status: AC
  Administered 2018-06-12: 10 mL via INTRAVENOUS

## 2018-06-12 NOTE — Progress Notes (Signed)
Patient tolerated iron infusion with no complaints voiced.  Peripheral IV site clean and dry with no swelling noted at site.  Good blood return noted before and after infusion.  Band aid applied.  VSs with discharge and left ambulatory with no s/s of distress noted.

## 2018-06-12 NOTE — Patient Instructions (Signed)
[facility]  Discharge Instructions:   _______________________________________________________________  Thank you for choosing [facility] at [facility] to provide your oncology and hematology care.  To afford each patient quality time with our providers, please arrive at least 15 minutes before your scheduled appointment.  You need to re-schedule your appointment if you arrive 10 or more minutes late.  We strive to give you quality time with our providers, and arriving late affects you and other patients whose appointments are after yours.  Also, if you no show three or more times for appointments you may be dismissed from the clinic.  Again, thank you for choosing [facility]. Our hope is that these requests will allow you access to exceptional care and in a timely manner. _______________________________________________________________  If you have questions after your visit, please contact our office at [phone] between the hours of 8:30 a.m. and 5:00 p.m. Voicemails left after 4:30 p.m. will not be returned until the following business day. _______________________________________________________________  For prescription refill requests, have your pharmacy contact our office. _______________________________________________________________  Recommendations made by the consultant and any test results will be sent to your referring physician. _______________________________________________________________ 

## 2018-06-23 ENCOUNTER — Ambulatory Visit (INDEPENDENT_AMBULATORY_CARE_PROVIDER_SITE_OTHER): Payer: Medicare Other

## 2018-06-23 DIAGNOSIS — Z95810 Presence of automatic (implantable) cardiac defibrillator: Secondary | ICD-10-CM

## 2018-06-23 DIAGNOSIS — I5022 Chronic systolic (congestive) heart failure: Secondary | ICD-10-CM

## 2018-06-24 ENCOUNTER — Telehealth: Payer: Self-pay

## 2018-06-24 NOTE — Progress Notes (Signed)
EPIC Encounter for ICM Monitoring  Patient Name: [name] is a 78 y.o. female Date: 06/24/2018 Primary Care Physican: [name], MD Primary Cardiologist:Koneswaran/Lawrence NP Electrophysiologist: Druscilla Brownie Weight:unknown Bi-V Pacing:96.3%  Attempted call to [name], nephew.Left detailed message per DPR regarding transmission.  Transmission reviewed.   Thoracic impedance normal.  Prescribed:Furosemide 40 mg 1 tablet daily.  Labs: 05/09/2018 Creatinine 1.11, BUN 17, Potassium 3.9, Sodium 137, GFR 47-55 03/25/2019 Creatinine 1.17, BUN 29, Potassium 4.6, Sodium 139, GFR 44-51  02/14/2019 Creatinine 1.07, BUN 27, Potassium 4.3, Sodium 140, GFR 49-57  12/05/2017 Creatinine 1.32, BUN 28, Potassium 4.0, Sodium 139, EGFR 37-43 08/28/2017 Creatinine 1.24, BUN 30, Potassium 3.6, Sodium 138, EGFR 41-47 08/15/2017 Potassium 3.6, Sodium142 08/10/2017 Creatinine2.19, BUN61, Potassium4.5, Sodium142, [id number]  08/09/2017 Creatinine2.88, BUN71, Potassium5.4, Sodium139, [id number], 10:09 PM results 08/09/2017 Creatinine2.73, BUN65, Potassium6.1, Sodium135, [id number], 03:06 PM results  Recommendations:Left voice mail with ICM number and encouraged to call if experiencing any fluid symptoms.  Follow-up plan: ICM clinic phone appointment on4/09/2018.   Copy of ICM check sent to Dr.Taylor.   3 month ICM trend: 06/23/2018    1 Year ICM trend:       [name], RN 06/24/2018 5:11 PM

## 2018-06-24 NOTE — Telephone Encounter (Signed)
Remote ICM transmission received.  Attempted call to nephew regarding ICM remote transmission and left detailed message, per DPR, with next ICM remote transmission date of 07/28/2018.  Advised to return call for any fluid symptoms or questions.

## 2018-07-11 ENCOUNTER — Telehealth: Payer: Self-pay | Admitting: Pharmacy Technician

## 2018-07-11 DIAGNOSIS — M0579 Rheumatoid arthritis with rheumatoid factor of multiple sites without organ or systems involvement: Secondary | ICD-10-CM

## 2018-07-11 MED ORDER — HYDROXYCHLOROQUINE SULFATE 200 MG PO TABS: ORAL_TABLET | ORAL | 0 refills | Status: DC

## 2018-07-11 NOTE — Telephone Encounter (Signed)
Patient is on Plaquenil. Please send in prescription to [facility].  Called and advised patient's nephew/caregiver, [name]

## 2018-07-17 ENCOUNTER — Ambulatory Visit: Payer: Medicare HMO | Admitting: Rheumatology

## 2018-07-28 ENCOUNTER — Other Ambulatory Visit: Payer: Self-pay

## 2018-07-28 ENCOUNTER — Ambulatory Visit (INDEPENDENT_AMBULATORY_CARE_PROVIDER_SITE_OTHER): Payer: Medicare Other

## 2018-07-28 DIAGNOSIS — I5022 Chronic systolic (congestive) heart failure: Secondary | ICD-10-CM | POA: Diagnosis not present

## 2018-07-28 DIAGNOSIS — Z95810 Presence of automatic (implantable) cardiac defibrillator: Secondary | ICD-10-CM

## 2018-07-30 NOTE — Progress Notes (Signed)
EPIC Encounter for ICM Monitoring  Patient Name: [name] is a 78 y.o. female Date: 07/30/2018 Primary Care Physican: [name], MD Primary Cardiologist:Koneswaran/Lawrence NP Electrophysiologist: Santina Evans Pacing:95.5% Weight:unknown  Call Marena Chancy, nephew. He reported patient is doing fine and denied any fluid symptoms.   Thoracic impedance normal.  Prescribed:Furosemide 40 mg 1 tablet daily.  Labs: 05/09/2018 Creatinine 1.11, BUN 17, Potassium 3.9, Sodium 137, GFR 47-55 03/25/2019 Creatinine 1.17, BUN 29, Potassium 4.6, Sodium 139, GFR 44-51  02/14/2019 Creatinine 1.07, BUN 27, Potassium 4.3, Sodium 140, GFR 49-57  12/05/2017 Creatinine 1.32, BUN 28, Potassium 4.0, Sodium 139, EGFR 37-43 08/28/2017 Creatinine 1.24, BUN 30, Potassium 3.6, Sodium 138, EGFR 41-47 08/15/2017 Potassium 3.6, Sodium142 08/10/2017 Creatinine2.19, BUN61, Potassium4.5, Sodium142, [id number]  08/09/2017 Creatinine2.88, BUN71, Potassium5.4, Sodium139, [id number], 10:09 PM results 08/09/2017 Creatinine2.73, BUN65, Potassium6.1, Sodium135, [id number], 03:06 PM results  Recommendations:No changes and encouraged to call if patient has any fluid symptoms.  Follow-up plan: ICM clinic phone appointment on5/03/2019.   Copy of ICM check sent to Dr.Taylor.  3 month ICM trend: 07/28/2018    1 Year ICM trend:       [name], RN 07/30/2018 8:02 AM

## 2018-08-12 DIAGNOSIS — R112 Nausea with vomiting, unspecified: Secondary | ICD-10-CM | POA: Diagnosis not present

## 2018-08-12 DIAGNOSIS — K529 Noninfective gastroenteritis and colitis, unspecified: Secondary | ICD-10-CM | POA: Diagnosis not present

## 2018-08-13 ENCOUNTER — Emergency Department (HOSPITAL_COMMUNITY): Payer: Medicare Other

## 2018-08-13 ENCOUNTER — Other Ambulatory Visit: Payer: Self-pay

## 2018-08-13 ENCOUNTER — Inpatient Hospital Stay (HOSPITAL_COMMUNITY)
Admission: EM | Admit: 2018-08-13 | Discharge: 2018-09-19 | DRG: 329 | Disposition: A | Discharge status: Other Acute Inpatient Hospital | Payer: Medicare Other | Attending: Internal Medicine | Admitting: Internal Medicine

## 2018-08-13 ENCOUNTER — Encounter (HOSPITAL_COMMUNITY): Payer: Self-pay

## 2018-08-13 DIAGNOSIS — I480 Paroxysmal atrial fibrillation: Secondary | ICD-10-CM | POA: Diagnosis not present

## 2018-08-13 DIAGNOSIS — Y836 Removal of other organ (partial) (total) as the cause of abnormal reaction of the patient, or of later complication, without mention of misadventure at the time of the procedure: Secondary | ICD-10-CM | POA: Diagnosis not present

## 2018-08-13 DIAGNOSIS — E876 Hypokalemia: Secondary | ICD-10-CM | POA: Diagnosis not present

## 2018-08-13 DIAGNOSIS — A419 Sepsis, unspecified organism: Secondary | ICD-10-CM | POA: Diagnosis not present

## 2018-08-13 DIAGNOSIS — Z881 Allergy status to other antibiotic agents status: Secondary | ICD-10-CM

## 2018-08-13 DIAGNOSIS — I5042 Chronic combined systolic (congestive) and diastolic (congestive) heart failure: Secondary | ICD-10-CM | POA: Diagnosis present

## 2018-08-13 DIAGNOSIS — Z452 Encounter for adjustment and management of vascular access device: Secondary | ICD-10-CM | POA: Diagnosis not present

## 2018-08-13 DIAGNOSIS — J90 Pleural effusion, not elsewhere classified: Secondary | ICD-10-CM | POA: Diagnosis not present

## 2018-08-13 DIAGNOSIS — J181 Lobar pneumonia, unspecified organism: Secondary | ICD-10-CM | POA: Diagnosis present

## 2018-08-13 DIAGNOSIS — I5043 Acute on chronic combined systolic (congestive) and diastolic (congestive) heart failure: Secondary | ICD-10-CM | POA: Diagnosis not present

## 2018-08-13 DIAGNOSIS — K567 Ileus, unspecified: Secondary | ICD-10-CM | POA: Diagnosis not present

## 2018-08-13 DIAGNOSIS — I472 Ventricular tachycardia: Secondary | ICD-10-CM

## 2018-08-13 DIAGNOSIS — R140 Abdominal distension (gaseous): Secondary | ICD-10-CM | POA: Diagnosis not present

## 2018-08-13 DIAGNOSIS — R571 Hypovolemic shock: Secondary | ICD-10-CM | POA: Diagnosis not present

## 2018-08-13 DIAGNOSIS — M1A09X0 Idiopathic chronic gout, multiple sites, without tophus (tophi): Secondary | ICD-10-CM | POA: Diagnosis present

## 2018-08-13 DIAGNOSIS — R34 Anuria and oliguria: Secondary | ICD-10-CM | POA: Diagnosis not present

## 2018-08-13 DIAGNOSIS — N170 Acute kidney failure with tubular necrosis: Secondary | ICD-10-CM | POA: Diagnosis not present

## 2018-08-13 DIAGNOSIS — Z9012 Acquired absence of left breast and nipple: Secondary | ICD-10-CM

## 2018-08-13 DIAGNOSIS — I2510 Atherosclerotic heart disease of native coronary artery without angina pectoris: Secondary | ICD-10-CM | POA: Diagnosis not present

## 2018-08-13 DIAGNOSIS — R945 Abnormal results of liver function studies: Secondary | ICD-10-CM | POA: Diagnosis not present

## 2018-08-13 DIAGNOSIS — L89152 Pressure ulcer of sacral region, stage 2: Secondary | ICD-10-CM | POA: Diagnosis present

## 2018-08-13 DIAGNOSIS — Z9861 Coronary angioplasty status: Secondary | ICD-10-CM

## 2018-08-13 DIAGNOSIS — Z515 Encounter for palliative care: Secondary | ICD-10-CM

## 2018-08-13 DIAGNOSIS — R0902 Hypoxemia: Secondary | ICD-10-CM | POA: Diagnosis not present

## 2018-08-13 DIAGNOSIS — Y92230 Patient room in hospital as the place of occurrence of the external cause: Secondary | ICD-10-CM | POA: Diagnosis not present

## 2018-08-13 DIAGNOSIS — E43 Unspecified severe protein-calorie malnutrition: Secondary | ICD-10-CM

## 2018-08-13 DIAGNOSIS — E872 Acidosis: Secondary | ICD-10-CM | POA: Diagnosis not present

## 2018-08-13 DIAGNOSIS — I471 Supraventricular tachycardia: Secondary | ICD-10-CM | POA: Diagnosis not present

## 2018-08-13 DIAGNOSIS — I5022 Chronic systolic (congestive) heart failure: Secondary | ICD-10-CM | POA: Diagnosis not present

## 2018-08-13 DIAGNOSIS — J189 Pneumonia, unspecified organism: Secondary | ICD-10-CM | POA: Diagnosis not present

## 2018-08-13 DIAGNOSIS — D696 Thrombocytopenia, unspecified: Secondary | ICD-10-CM | POA: Diagnosis not present

## 2018-08-13 DIAGNOSIS — R0600 Dyspnea, unspecified: Secondary | ICD-10-CM

## 2018-08-13 DIAGNOSIS — I130 Hypertensive heart and chronic kidney disease with heart failure and stage 1 through stage 4 chronic kidney disease, or unspecified chronic kidney disease: Secondary | ICD-10-CM | POA: Diagnosis present

## 2018-08-13 DIAGNOSIS — R001 Bradycardia, unspecified: Secondary | ICD-10-CM | POA: Diagnosis not present

## 2018-08-13 DIAGNOSIS — K8020 Calculus of gallbladder without cholecystitis without obstruction: Secondary | ICD-10-CM | POA: Diagnosis present

## 2018-08-13 DIAGNOSIS — R1084 Generalized abdominal pain: Secondary | ICD-10-CM | POA: Diagnosis not present

## 2018-08-13 DIAGNOSIS — R6521 Severe sepsis with septic shock: Secondary | ICD-10-CM | POA: Diagnosis not present

## 2018-08-13 DIAGNOSIS — J9690 Respiratory failure, unspecified, unspecified whether with hypoxia or hypercapnia: Secondary | ICD-10-CM | POA: Diagnosis not present

## 2018-08-13 DIAGNOSIS — M069 Rheumatoid arthritis, unspecified: Secondary | ICD-10-CM | POA: Diagnosis present

## 2018-08-13 DIAGNOSIS — Z978 Presence of other specified devices: Secondary | ICD-10-CM

## 2018-08-13 DIAGNOSIS — N179 Acute kidney failure, unspecified: Secondary | ICD-10-CM | POA: Diagnosis not present

## 2018-08-13 DIAGNOSIS — K5651 Intestinal adhesions [bands], with partial obstruction: Secondary | ICD-10-CM | POA: Diagnosis present

## 2018-08-13 DIAGNOSIS — R7303 Prediabetes: Secondary | ICD-10-CM | POA: Diagnosis not present

## 2018-08-13 DIAGNOSIS — E46 Unspecified protein-calorie malnutrition: Secondary | ICD-10-CM | POA: Diagnosis not present

## 2018-08-13 DIAGNOSIS — K55019 Acute (reversible) ischemia of small intestine, extent unspecified: Principal | ICD-10-CM | POA: Diagnosis present

## 2018-08-13 DIAGNOSIS — J9601 Acute respiratory failure with hypoxia: Secondary | ICD-10-CM

## 2018-08-13 DIAGNOSIS — Z853 Personal history of malignant neoplasm of breast: Secondary | ICD-10-CM

## 2018-08-13 DIAGNOSIS — I255 Ischemic cardiomyopathy: Secondary | ICD-10-CM | POA: Diagnosis present

## 2018-08-13 DIAGNOSIS — I4892 Unspecified atrial flutter: Secondary | ICD-10-CM | POA: Diagnosis not present

## 2018-08-13 DIAGNOSIS — K388 Other specified diseases of appendix: Secondary | ICD-10-CM | POA: Diagnosis present

## 2018-08-13 DIAGNOSIS — Z79899 Other long term (current) drug therapy: Secondary | ICD-10-CM

## 2018-08-13 DIAGNOSIS — R0689 Other abnormalities of breathing: Secondary | ICD-10-CM

## 2018-08-13 DIAGNOSIS — I313 Pericardial effusion (noninflammatory): Secondary | ICD-10-CM | POA: Diagnosis not present

## 2018-08-13 DIAGNOSIS — I361 Nonrheumatic tricuspid (valve) insufficiency: Secondary | ICD-10-CM | POA: Diagnosis not present

## 2018-08-13 DIAGNOSIS — D638 Anemia in other chronic diseases classified elsewhere: Secondary | ICD-10-CM | POA: Diagnosis present

## 2018-08-13 DIAGNOSIS — N183 Chronic kidney disease, stage 3 unspecified: Secondary | ICD-10-CM

## 2018-08-13 DIAGNOSIS — K219 Gastro-esophageal reflux disease without esophagitis: Secondary | ICD-10-CM | POA: Diagnosis present

## 2018-08-13 DIAGNOSIS — Z7189 Other specified counseling: Secondary | ICD-10-CM | POA: Diagnosis not present

## 2018-08-13 DIAGNOSIS — N189 Chronic kidney disease, unspecified: Secondary | ICD-10-CM | POA: Diagnosis not present

## 2018-08-13 DIAGNOSIS — K56609 Unspecified intestinal obstruction, unspecified as to partial versus complete obstruction: Secondary | ICD-10-CM | POA: Diagnosis present

## 2018-08-13 DIAGNOSIS — L8990 Pressure ulcer of unspecified site, unspecified stage: Secondary | ICD-10-CM

## 2018-08-13 DIAGNOSIS — R000 Tachycardia, unspecified: Secondary | ICD-10-CM | POA: Diagnosis not present

## 2018-08-13 DIAGNOSIS — J9811 Atelectasis: Secondary | ICD-10-CM | POA: Diagnosis not present

## 2018-08-13 DIAGNOSIS — K639 Disease of intestine, unspecified: Secondary | ICD-10-CM | POA: Diagnosis not present

## 2018-08-13 DIAGNOSIS — R0602 Shortness of breath: Secondary | ICD-10-CM | POA: Diagnosis not present

## 2018-08-13 DIAGNOSIS — Z1159 Encounter for screening for other viral diseases: Secondary | ICD-10-CM | POA: Diagnosis not present

## 2018-08-13 DIAGNOSIS — E870 Hyperosmolality and hypernatremia: Secondary | ICD-10-CM | POA: Diagnosis not present

## 2018-08-13 DIAGNOSIS — Z931 Gastrostomy status: Secondary | ICD-10-CM | POA: Diagnosis not present

## 2018-08-13 DIAGNOSIS — Z0189 Encounter for other specified special examinations: Secondary | ICD-10-CM

## 2018-08-13 DIAGNOSIS — I5023 Acute on chronic systolic (congestive) heart failure: Secondary | ICD-10-CM | POA: Diagnosis not present

## 2018-08-13 DIAGNOSIS — K769 Liver disease, unspecified: Secondary | ICD-10-CM | POA: Diagnosis not present

## 2018-08-13 DIAGNOSIS — Z66 Do not resuscitate: Secondary | ICD-10-CM | POA: Diagnosis not present

## 2018-08-13 DIAGNOSIS — Z95810 Presence of automatic (implantable) cardiac defibrillator: Secondary | ICD-10-CM

## 2018-08-13 DIAGNOSIS — I081 Rheumatic disorders of both mitral and tricuspid valves: Secondary | ICD-10-CM | POA: Diagnosis present

## 2018-08-13 DIAGNOSIS — D72829 Elevated white blood cell count, unspecified: Secondary | ICD-10-CM

## 2018-08-13 DIAGNOSIS — Y848 Other medical procedures as the cause of abnormal reaction of the patient, or of later complication, without mention of misadventure at the time of the procedure: Secondary | ICD-10-CM | POA: Diagnosis not present

## 2018-08-13 DIAGNOSIS — T380X5A Adverse effect of glucocorticoids and synthetic analogues, initial encounter: Secondary | ICD-10-CM | POA: Diagnosis not present

## 2018-08-13 DIAGNOSIS — Z9911 Dependence on respirator [ventilator] status: Secondary | ICD-10-CM | POA: Diagnosis not present

## 2018-08-13 DIAGNOSIS — K922 Gastrointestinal hemorrhage, unspecified: Secondary | ICD-10-CM | POA: Diagnosis not present

## 2018-08-13 DIAGNOSIS — H9190 Unspecified hearing loss, unspecified ear: Secondary | ICD-10-CM | POA: Diagnosis present

## 2018-08-13 DIAGNOSIS — T8132XA Disruption of internal operation (surgical) wound, not elsewhere classified, initial encounter: Secondary | ICD-10-CM | POA: Diagnosis not present

## 2018-08-13 DIAGNOSIS — J42 Unspecified chronic bronchitis: Secondary | ICD-10-CM | POA: Diagnosis present

## 2018-08-13 DIAGNOSIS — E869 Volume depletion, unspecified: Secondary | ICD-10-CM | POA: Diagnosis not present

## 2018-08-13 DIAGNOSIS — R109 Unspecified abdominal pain: Secondary | ICD-10-CM | POA: Diagnosis not present

## 2018-08-13 DIAGNOSIS — I959 Hypotension, unspecified: Secondary | ICD-10-CM | POA: Diagnosis not present

## 2018-08-13 DIAGNOSIS — K591 Functional diarrhea: Secondary | ICD-10-CM | POA: Diagnosis not present

## 2018-08-13 DIAGNOSIS — K353 Acute appendicitis with localized peritonitis, without perforation or gangrene: Secondary | ICD-10-CM | POA: Diagnosis not present

## 2018-08-13 DIAGNOSIS — D631 Anemia in chronic kidney disease: Secondary | ICD-10-CM | POA: Diagnosis present

## 2018-08-13 DIAGNOSIS — E878 Other disorders of electrolyte and fluid balance, not elsewhere classified: Secondary | ICD-10-CM | POA: Diagnosis not present

## 2018-08-13 DIAGNOSIS — Z87891 Personal history of nicotine dependence: Secondary | ICD-10-CM

## 2018-08-13 DIAGNOSIS — J9600 Acute respiratory failure, unspecified whether with hypoxia or hypercapnia: Secondary | ICD-10-CM | POA: Diagnosis not present

## 2018-08-13 DIAGNOSIS — D62 Acute posthemorrhagic anemia: Secondary | ICD-10-CM | POA: Diagnosis not present

## 2018-08-13 DIAGNOSIS — I340 Nonrheumatic mitral (valve) insufficiency: Secondary | ICD-10-CM | POA: Diagnosis not present

## 2018-08-13 DIAGNOSIS — Z955 Presence of coronary angioplasty implant and graft: Secondary | ICD-10-CM

## 2018-08-13 DIAGNOSIS — R739 Hyperglycemia, unspecified: Secondary | ICD-10-CM | POA: Diagnosis not present

## 2018-08-13 DIAGNOSIS — K55029 Acute infarction of small intestine, extent unspecified: Secondary | ICD-10-CM | POA: Diagnosis present

## 2018-08-13 DIAGNOSIS — Z4682 Encounter for fitting and adjustment of non-vascular catheter: Secondary | ICD-10-CM | POA: Diagnosis not present

## 2018-08-13 DIAGNOSIS — E785 Hyperlipidemia, unspecified: Secondary | ICD-10-CM | POA: Diagnosis not present

## 2018-08-13 DIAGNOSIS — K9189 Other postprocedural complications and disorders of digestive system: Secondary | ICD-10-CM | POA: Diagnosis not present

## 2018-08-13 DIAGNOSIS — I252 Old myocardial infarction: Secondary | ICD-10-CM

## 2018-08-13 DIAGNOSIS — I420 Dilated cardiomyopathy: Secondary | ICD-10-CM | POA: Diagnosis present

## 2018-08-13 DIAGNOSIS — T8089XA Other complications following infusion, transfusion and therapeutic injection, initial encounter: Secondary | ICD-10-CM | POA: Diagnosis not present

## 2018-08-13 DIAGNOSIS — Z6822 Body mass index (BMI) 22.0-22.9, adult: Secondary | ICD-10-CM

## 2018-08-13 LAB — URINALYSIS, ROUTINE W REFLEX MICROSCOPIC
Bilirubin Urine: NEGATIVE
Glucose, UA: NEGATIVE mg/dL
Ketones, ur: NEGATIVE mg/dL
Nitrite: NEGATIVE
Protein, ur: 30 mg/dL — AB
Specific Gravity, Urine: 1.016 (ref 1.005–1.030)
pH: 5 (ref 5.0–8.0)

## 2018-08-13 LAB — COMPREHENSIVE METABOLIC PANEL
ALT: 17 U/L (ref 0–44)
AST: 21 U/L (ref 15–41)
Albumin: 2.8 g/dL — ABNORMAL LOW (ref 3.5–5.0)
Alkaline Phosphatase: 129 U/L — ABNORMAL HIGH (ref 38–126)
Anion gap: 15 (ref 5–15)
BUN: 65 mg/dL — ABNORMAL HIGH (ref 8–23)
CO2: 22 mmol/L (ref 22–32)
Calcium: 8.8 mg/dL — ABNORMAL LOW (ref 8.9–10.3)
Chloride: 97 mmol/L — ABNORMAL LOW (ref 98–111)
Creatinine, Ser: 3.21 mg/dL — ABNORMAL HIGH (ref 0.44–1.00)
GFR calc Af Amer: 15 mL/min — ABNORMAL LOW (ref >60)
GFR calc non Af Amer: 13 mL/min — ABNORMAL LOW (ref >60)
Glucose, Bld: 120 mg/dL — ABNORMAL HIGH (ref 70–99)
Potassium: 4 mmol/L (ref 3.5–5.1)
Sodium: 134 mmol/L — ABNORMAL LOW (ref 135–145)
Total Bilirubin: 1.3 mg/dL — ABNORMAL HIGH (ref 0.3–1.2)
Total Protein: 8.4 g/dL — ABNORMAL HIGH (ref 6.5–8.1)

## 2018-08-13 LAB — CBC WITH DIFFERENTIAL/PLATELET
Abs Immature Granulocytes: 0.17 10*3/uL — ABNORMAL HIGH (ref 0.00–0.07)
Basophils Absolute: 0 10*3/uL (ref 0.0–0.1)
Basophils Relative: 0 %
Eosinophils Absolute: 0.1 10*3/uL (ref 0.0–0.5)
Eosinophils Relative: 0 %
HCT: 34 % — ABNORMAL LOW (ref 36.0–46.0)
Hemoglobin: 10.8 g/dL — ABNORMAL LOW (ref 12.0–15.0)
Immature Granulocytes: 1 %
Lymphocytes Relative: 7 %
Lymphs Abs: 1.6 10*3/uL (ref 0.7–4.0)
MCH: 26.3 pg (ref 26.0–34.0)
MCHC: 31.8 g/dL (ref 30.0–36.0)
MCV: 82.9 fL (ref 80.0–100.0)
Monocytes Absolute: 2.5 10*3/uL — ABNORMAL HIGH (ref 0.1–1.0)
Monocytes Relative: 12 %
Neutro Abs: 17.6 10*3/uL — ABNORMAL HIGH (ref 1.7–7.7)
Neutrophils Relative %: 80 %
Platelets: 211 10*3/uL (ref 150–400)
RBC: 4.1 MIL/uL (ref 3.87–5.11)
RDW: 19.6 % — ABNORMAL HIGH (ref 11.5–15.5)
WBC: 21.9 10*3/uL — ABNORMAL HIGH (ref 4.0–10.5)
nRBC: 0 % (ref 0.0–0.2)

## 2018-08-13 LAB — BRAIN NATRIURETIC PEPTIDE: B Natriuretic Peptide: 404 pg/mL — ABNORMAL HIGH (ref 0.0–100.0)

## 2018-08-13 LAB — PROTIME-INR
INR: 1.6 — ABNORMAL HIGH (ref 0.8–1.2)
Prothrombin Time: 18.7 seconds — ABNORMAL HIGH (ref 11.4–15.2)

## 2018-08-13 LAB — LIPASE, BLOOD: Lipase: 30 U/L (ref 11–51)

## 2018-08-13 LAB — TROPONIN I: Troponin I: 0.04 ng/mL (ref <0.03)

## 2018-08-13 MED ORDER — IOHEXOL 300 MG/ML  SOLN: 100.0000 mL | Freq: Once | INTRAMUSCULAR | Status: DC | PRN

## 2018-08-13 MED ORDER — DOXYCYCLINE HYCLATE 100 MG PO TABS: 100.0000 mg | ORAL_TABLET | Freq: Two times a day (BID) | ORAL | Status: DC

## 2018-08-13 MED ORDER — SODIUM CHLORIDE 0.9 % IV SOLN
INTRAVENOUS | Status: DC
  Administered 2018-08-13: 22:00:00 via INTRAVENOUS

## 2018-08-13 MED ORDER — SODIUM CHLORIDE 0.9 % IV SOLN: INTRAVENOUS | Status: DC

## 2018-08-13 MED ORDER — SODIUM CHLORIDE 0.9 % IV SOLN
INTRAVENOUS | Status: DC | PRN
  Administered 2018-08-13 – 2018-08-17 (×3): via INTRAVENOUS

## 2018-08-13 MED ORDER — SODIUM CHLORIDE 0.9 % IV SOLN
1.0000 g | INTRAVENOUS | Status: DC
  Administered 2018-08-13: 1 g via INTRAVENOUS
  Filled 2018-08-13: qty 10

## 2018-08-13 MED ORDER — ACETAMINOPHEN 650 MG RE SUPP: 650.0000 mg | Freq: Four times a day (QID) | RECTAL | Status: DC | PRN

## 2018-08-13 MED ORDER — ONDANSETRON HCL 4 MG/2ML IJ SOLN
4.0000 mg | Freq: Four times a day (QID) | INTRAMUSCULAR | Status: DC | PRN
  Administered 2018-08-16 – 2018-08-19 (×3): 4 mg via INTRAVENOUS
  Filled 2018-08-13 (×3): qty 2

## 2018-08-13 MED ORDER — ACETAMINOPHEN 325 MG PO TABS
650.0000 mg | ORAL_TABLET | Freq: Four times a day (QID) | ORAL | Status: DC | PRN
  Administered 2018-08-29 – 2018-09-14 (×3): 650 mg via ORAL
  Filled 2018-08-13 (×3): qty 2

## 2018-08-13 MED ORDER — SODIUM CHLORIDE 0.9 % IV SOLN
100.0000 mg | Freq: Two times a day (BID) | INTRAVENOUS | Status: DC
  Administered 2018-08-13 – 2018-08-19 (×12): 100 mg via INTRAVENOUS
  Filled 2018-08-13 (×15): qty 100

## 2018-08-13 MED ORDER — SODIUM CHLORIDE 0.9 % IV SOLN
INTRAVENOUS | Status: AC
  Administered 2018-08-14 – 2018-08-15 (×3): via INTRAVENOUS

## 2018-08-13 MED ORDER — ENOXAPARIN SODIUM 30 MG/0.3ML ~~LOC~~ SOLN
30.0000 mg | SUBCUTANEOUS | Status: DC
  Administered 2018-08-14 – 2018-08-20 (×7): 30 mg via SUBCUTANEOUS
  Filled 2018-08-13 (×7): qty 0.3

## 2018-08-13 MED ORDER — ONDANSETRON HCL 4 MG PO TABS: 4.0000 mg | ORAL_TABLET | Freq: Four times a day (QID) | ORAL | Status: DC | PRN

## 2018-08-13 NOTE — ED Notes (Signed)
NGT placed on LIWS

## 2018-08-13 NOTE — ED Notes (Signed)
Lab In room

## 2018-08-13 NOTE — ED Triage Notes (Signed)
Pt brought in by EMS due to abdominal swelling for 3 days Last BM monday. Pt reports not eating well. Pt very HOH

## 2018-08-13 NOTE — ED Notes (Signed)
XR in room 

## 2018-08-13 NOTE — ED Notes (Signed)
Lab in room.

## 2018-08-13 NOTE — ED Notes (Signed)
ED TO INPATIENT HANDOFF REPORT  ED Nurse Name and Phone #:  Lonn Georgia [phone]  S Name/Age/Gender [name] 78 y.o. female Room/Bed: APA19/APA19  Code Status   Code Status: Full Code  Home/SNF/Other Home Patient oriented to: self, place, time and situation Is this baseline? Yes   Triage Complete: Triage complete  Chief Complaint Abdominal Pain  Triage Note Pt brought in by EMS due to abdominal swelling for 3 days Last BM monday. Pt reports not eating well. Pt very HOH   Allergies Allergies  Allergen Reactions  . Macrobid [Nitrofurantoin Macrocrystal] Hives and Itching    Level of Care/Admitting Diagnosis ED Disposition    ED Disposition Condition [facility] Area: [facility] [[id number]]  Level of Care: Stepdown [14]  Covid Evaluation: N/A  Diagnosis: CAP (community acquired pneumonia) [428768]  Admitting Physician: Reubin Milan [[id number]]  Attending Physician: Reubin Milan [[id number]]  Estimated length of stay: past midnight tomorrow  Certification:: I certify this patient will need inpatient services for at least 2 midnights  PT Class (Do Not Modify): Inpatient [101]  PT Acc Code (Do Not Modify): Private [1]       B Medical/Surgery History Past Medical History:  Diagnosis Date  . Anemia    Hgb of 9-10  . Anemia of chronic disease    Hgb of 9-10 chronically; 06/2010: H&H-10.7/33.5, MCV-81, normal iron studies in 2010   . Arteriosclerotic cardiovascular disease (ASCVD)    Remote PTCA by patient report; LBBB; associated cardiomyopathy, presumed ischemic with EF 40-45% previously, 20% in 06/2009; h/o clinical congestive heart failure; negative stress nuclear in 2009 with inferoseptal and apical scar  . Automatic implantable cardioverter-defibrillator in situ   . Breast cancer ([place])    breast  . Chronic bronchitis ([place])   . Chronic renal disease, stage 3, moderately decreased glomerular filtration rate (GFR) between 30-59 mL/min/1.73  square meter (HCC) 02/01/2016  . Congestive heart failure (CHF) ([place])   . Elevated cholesterol   . Elevated sed rate   . GERD (gastroesophageal reflux disease)   . GI bleed   . Gout   . HOH (hard of hearing)   . Hyperlipidemia   . Hypertension   . Rheumatoid arthritis ([place])   . UTI (urinary tract infection)    Past Surgical History:  Procedure Laterality Date  . BI-VENTRICULAR IMPLANTABLE CARDIOVERTER DEFIBRILLATOR N/A 07/28/2012   Procedure: BI-VENTRICULAR IMPLANTABLE CARDIOVERTER DEFIBRILLATOR  (CRT-D);  Surgeon: Evans Lance, MD;  Location: [facility] CATH LAB;  Service: Cardiovascular;  Laterality: N/A;  . BI-VENTRICULAR IMPLANTABLE CARDIOVERTER DEFIBRILLATOR  (CRT-D)  07/28/2012  . BREAST BIOPSY Bilateral   . CATARACT EXTRACTION W/ INTRAOCULAR LENS IMPLANT Left   . CATARACT EXTRACTION W/PHACO Right 09/15/2013   Procedure: CATARACT EXTRACTION PHACO AND INTRAOCULAR LENS PLACEMENT (IOC);  Surgeon: Elta Guadeloupe T. Gershon Crane, MD;  Location: [facility] ORS;  Service: Ophthalmology;  Laterality: Right;  CDE:  10.74  . COLONOSCOPY N/A 03/04/2015   Procedure: COLONOSCOPY;  Surgeon: Rogene Houston, MD;  Location: [facility] ENDO SU[facility];  Service: Endoscopy;  Laterality: N/A;  10:50   . ESOPHAGOGASTRODUODENOSCOPY N/A 03/04/2015   Procedure: ESOPHAGOGASTRODUODENOSCOPY (EGD);  Surgeon: Rogene Houston, MD;  Location[facility];  Service: Endoscopy;  Laterality: N/A;  . ESOPHAGOGASTRODUODENOSCOPY N/A 09/21/2016   Procedure: ESOPHAGOGASTRODUODENOSCOPY (EGD);  Surgeon: Rogene Houston, MD;  [facility];  Service: Endoscopy;  Laterality: N/A;  . GIVENS CAPSULE STUDY N/A 09/22/2016   Procedure: GIVENS CAPSULE STUDY;  Surgeon: Hildred Laser  U, MD;  Location[facility];  Service: Endoscopy;  Laterality: N/A;  . MASTECTOMY Left 1998  . TENDON TRANSFER Right 08/15/2017   Procedure: TENDON TRANSFER RIGHT WRIST EXTENSORS AS NEEDED, ULNA RESECTION AND STABILIZATION;  Surgeon: Charlotte Crumb, MD;  Location: [facility];   Service: Orthopedics;  Laterality: Right;  . TOTAL KNEE ARTHROPLASTY Right    Dr.Harrison  . TUBAL LIGATION       A IV Location/Drains/Wounds Patient Lines/Drains/Airways Status   Active Line/Drains/Airways    Name:   Placement date:   Placement time:   Site:   Days:   Peripheral IV 08/13/18 Right Antecubital   08/13/18    1923    Antecubital   less than 1   NG/OG Tube Nasogastric Right nare Documented cm marking at nare/ corner of mouth   08/13/18    2209    Right nare   less than 1   Incision (Closed) 08/15/17 Arm Right   08/15/17    1519     363          Intake/Output Last 24 hours  Intake/Output Summary (Last 24 hours) at 08/13/2018 2249 Last data filed at 08/13/2018 1924 Gross per 24 hour  Intake -  Output 125 ml  Net -125 ml    Labs/Imaging Results for orders placed or performed during the hospital encounter of 08/13/18 (from the past 48 hour(s))  Urinalysis, Routine w reflex microscopic     Status: Abnormal   Collection Time: 08/13/18  6:49 PM  Result Value Ref Range   Color, Urine AMBER (A) YELLOW    Comment: BIOCHEMICALS MAY BE AFFECTED BY COLOR   APPearance CLOUDY (A) CLEAR   Specific Gravity, Urine 1.016 1.005 - 1.030   pH 5.0 5.0 - 8.0   Glucose, UA NEGATIVE NEGATIVE mg/dL   Hgb urine dipstick MODERATE (A) NEGATIVE   Bilirubin Urine NEGATIVE NEGATIVE   Ketones, ur NEGATIVE NEGATIVE mg/dL   Protein, ur 30 (A) NEGATIVE mg/dL   Nitrite NEGATIVE NEGATIVE   Leukocytes,Ua TRACE (A) NEGATIVE   RBC / HPF 0-5 0 - 5 RBC/hpf   WBC, UA 0-5 0 - 5 WBC/hpf   Bacteria, UA MANY (A) NONE SEEN   Squamous Epithelial / LPF 0-5 0 - 5   Hyaline Casts, UA PRESENT     Comment: Performed at [facility], [address], [place], [place] [address]  Comprehensive metabolic panel     Status: Abnormal   Collection Time: 08/13/18  7:26 PM  Result Value Ref Range   Sodium 134 (L) 135 - 145 mmol/L   Potassium 4.0 3.5 - 5.1 mmol/L   Chloride 97 (L) 98 - 111 mmol/L   CO2 22 22 - 32  mmol/L   Glucose, Bld 120 (H) 70 - 99 mg/dL   BUN 65 (H) 8 - 23 mg/dL   Creatinine, Ser 3.21 (H) 0.44 - 1.00 mg/dL   Calcium 8.8 (L) 8.9 - 10.3 mg/dL   Total Protein 8.4 (H) 6.5 - 8.1 g/dL   Albumin 2.8 (L) 3.5 - 5.0 g/dL   AST 21 15 - 41 U/L   ALT 17 0 - 44 U/L   Alkaline Phosphatase 129 (H) 38 - 126 U/L   Total Bilirubin 1.3 (H) 0.3 - 1.2 mg/dL   GFR calc non Af Amer 13 (L) >60 mL/min   GFR calc Af Amer 15 (L) >60 mL/min   Anion gap 15 5 - 15    Comment: Performed at [facility], [address], [place], [place] [address]  Lipase, blood     Status: None   Collection Time: 08/13/18  7:26 PM  Result Value Ref Range   Lipase 30 11 - 51 U/L    Comment: Performed at [facility], [address], [place], [place] [address]  Brain natriuretic peptide     Status: Abnormal   Collection Time: 08/13/18  7:26 PM  Result Value Ref Range   B Natriuretic Peptide 404.0 (H) 0.0 - 100.0 pg/mL    Comment: Performed at [facility], [address], [place], [place] [address]  Troponin I - Once     Status: Abnormal   Collection Time: 08/13/18  7:26 PM  Result Value Ref Range   Troponin I 0.04 (HH) <0.03 ng/mL    Comment: CRITICAL RESULT CALLED TO, READ BACK BY AND VERIFIED WITH: Arianni Gallego,K ON 08/13/18 AT 2025 BY LOY,C Performed at [facility], [address] [place], [place] [address]   CBC with Differential     Status: Abnormal   Collection Time: 08/13/18  7:26 PM  Result Value Ref Range   WBC 21.9 (H) 4.0 - 10.5 K/uL    Comment: WHITE COUNT CONFIRMED ON SMEAR   RBC 4.10 3.87 - 5.11 MIL/uL   Hemoglobin 10.8 (L) 12.0 - 15.0 g/dL   HCT 34.0 (L) 36.0 - 46.0 %   MCV 82.9 80.0 - 100.0 fL   MCH 26.3 26.0 - 34.0 pg   MCHC 31.8 30.0 - 36.0 g/dL   RDW 19.6 (H) 11.5 - 15.5 %   Platelets 211 150 - 400 K/uL   nRBC 0.0 0.0 - 0.2 %   Neutrophils Relative % 80 %   Neutro Abs 17.6 (H) 1.7 - 7.7 K/uL   Lymphocytes Relative 7 %   Lymphs Abs 1.6 0.7 - 4.0 K/uL   Monocytes Relative 12 %   Monocytes  Absolute 2.5 (H) 0.1 - 1.0 K/uL   Eosinophils Relative 0 %   Eosinophils Absolute 0.1 0.0 - 0.5 K/uL   Basophils Relative 0 %   Basophils Absolute 0.0 0.0 - 0.1 K/uL   Immature Granulocytes 1 %   Abs Immature Granulocytes 0.17 (H) 0.00 - 0.07 K/uL    Comment: Performed at [facility], [address], [place], [place] [address]  Protime-INR     Status: Abnormal   Collection Time: 08/13/18  7:26 PM  Result Value Ref Range   Prothrombin Time 18.7 (H) 11.4 - 15.2 seconds   INR 1.6 (H) 0.8 - 1.2    Comment: (NOTE) INR goal varies based on device and disease states. Performed at [facility], [address] [place], [place] [address]    Ct Abdomen Pelvis Wo Contrast  Result Date: 08/13/2018 CLINICAL DATA:  Abdominal swelling poor appetite EXAM: CT ABDOMEN AND PELVIS WITHOUT CONTRAST TECHNIQUE: Multidetector CT imaging of the abdomen and pelvis was performed following the standard protocol without IV contrast. COMPARISON:  Report 04/25/2001 FINDINGS: Lower chest: Lung bases demonstrate no pleural effusion. Atelectasis or mild aspiration at the right base. Cardiomegaly with partially visualized intracardiac pacing leads. Coronary vascular calcification. Small moderate hiatal hernia. Hepatobiliary: Multiple calcified gallstones. Mildly lobulated liver margin. No focal hepatic abnormality or biliary dilatation. Pancreas: Unremarkable. No pancreatic ductal dilatation or surrounding inflammatory changes. Spleen: Normal in size without focal abnormality. Adrenals/Urinary Tract: Adrenal glands are within normal limits. No hydronephrosis. Probable cyst in the mid to lower right kidney. The bladder is unremarkable. Stomach/Bowel: Fluid-filled stomach. Multiple loops of fluid-filled dilated small bowel measuring  up to 3.4 cm. Distal small bowel is decompressed as is the colon. Poorly identified transition point due to lack of enteral contrast but suspected to be within the distal small bowel/pelvis. No intramural  air. No definite bowel wall thickening. Vascular/Lymphatic: Extensive aortic atherosclerosis. No aneurysm. No significant adenopathy Reproductive: Uterus unremarkable. 3 x 2.4 cm hyperdense/partially calcified left pelvic sidewall mass. Other: No free air. Slightly dense fluid within the posterior pelvis. Musculoskeletal: Grade 1 anterolisthesis L4 on L5. Mild to moderate superior endplate deformity at L4, new since 2017 comparison radiographs. IMPRESSION: 1. Multiple loops of dilated fluid-filled small bowel, consistent with mechanical small bowel obstruction. Poorly identified transition point due to lack of contrast, suspect that transition point with in the distal ileum in the pelvis, given collapsed appearance of the terminal ileum and colon. No free air at this time. 2. Trace amount of slightly dense fluid within the pelvis, may reflect a small amount of hemorrhagic ascites. 3. Partially calcified/hyperdense 3 cm left pelvic sidewall mass, possibly a lymph node or a left adnexal mass. 4. Mild consolidation at the right lung base which may be secondary to atelectasis, mild pneumonia or aspiration. 5. Cardiomegaly 6. Gallstones.  Slightly lobulated liver margin, question cirrhosis Electronically Signed   By: Donavan Foil M.D.   On: 08/13/2018 21:25   Dg Chest Port 1 View  Result Date: 08/13/2018 CLINICAL DATA:  Abdominal pain EXAM: PORTABLE CHEST 1 VIEW COMPARISON:  12/13/2015 FINDINGS: Left AICD remains in place, unchanged. Cardiomegaly with vascular congestion. Suspect early interstitial edema. Low volumes with bibasilar atelectasis and small effusions. No acute bony abnormality. IMPRESSION: Findings compatible with mild edema/CHF. Low volumes with bibasilar atelectasis and small effusions. Electronically Signed   By: Rolm Baptise M.D.   On: 08/13/2018 19:38   Dg Chest Port 1v Same Day  Result Date: 08/13/2018 CLINICAL DATA:  Is NG tube placement EXAM: PORTABLE CHEST 1 VIEW COMPARISON:  08/13/2018  FINDINGS: NG tube is in place with the tip in the proximal stomach. Left AICD is unchanged. Cardiomegaly, bibasilar atelectasis. No overt edema or visible effusions. No acute bony abnormality. IMPRESSION: NG tube tip in the proximal stomach. Cardiomegaly, bibasilar atelectasis. Electronically Signed   By: Rolm Baptise M.D.   On: 08/13/2018 22:31    Pending Labs Unresulted Labs (From admission, onward)    Start     Ordered   08/20/18 0500  Creatinine, serum  (enoxaparin (LOVENOX)    CrCl < 30 ml/min)  Weekly,   R    Comments:  while on enoxaparin therapy.    08/13/18 2237   08/14/18 0500  CBC WITH DIFFERENTIAL  Daily,   R     08/13/18 2237   08/14/18 0500  Comprehensive metabolic panel  Daily,   R     08/13/18 2237   08/14/18 0130  Troponin I - Now Then Q6H  Now then every 6 hours,   R     08/13/18 2225   08/13/18 2235  Culture, sputum-assessment  Once,   R    Question:  Patient immune status  Answer:  Immunocompromised   08/13/18 2234   08/13/18 2235  Gram stain  Once,   R    Question:  Patient immune status  Answer:  Immunocompromised   08/13/18 2234   08/13/18 2235  Strep pneumoniae urinary antigen  Once,   R     08/13/18 2234   08/13/18 2134  Culture, blood (routine x 2) Call MD if unable to obtain prior to antibiotics  being given  BLOOD CULTURE X 2,   R    Comments:  If blood cultures drawn in Emergency Department - Do not draw and cancel order    08/13/18 2134   08/13/18 1849  Urine culture  ONCE - STAT,   STAT     08/13/18 1848          Vitals/Pain Today's Vitals   08/13/18 2100 08/13/18 2130 08/13/18 2145 08/13/18 2200  BP: (!) 147/61 (!) 149/63  132/67  Pulse:      Resp: 20  (!) 30   Temp:      TempSrc:      SpO2:      Weight:      PainSc:        Isolation Precautions No active isolations  Medications Medications  iohexol (OMNIPAQUE) 300 MG/ML solution 100 mL (has no administration in time range)  cefTRIAXone (ROCEPHIN) 1 g in sodium chloride 0.9 % 100 mL  IVPB (1 g Intravenous New Bag/Given 08/13/18 2224)  doxycycline (VIBRAMYCIN) 100 mg in sodium chloride 0.9 % 250 mL IVPB (has no administration in time range)  0.9 %  sodium chloride infusion ( Intravenous New Bag/Given 08/13/18 2223)  0.9 %  sodium chloride infusion ( Intravenous New Bag/Given 08/13/18 2223)  enoxaparin (LOVENOX) injection 30 mg (has no administration in time range)  ondansetron (ZOFRAN) tablet 4 mg (has no administration in time range)    Or  ondansetron (ZOFRAN) injection 4 mg (has no administration in time range)  acetaminophen (TYLENOL) tablet 650 mg (has no administration in time range)    Or  acetaminophen (TYLENOL) suppository 650 mg (has no administration in time range)  0.9 %  sodium chloride infusion (has no administration in time range)    Mobility walks with device Moderate fall risk   Focused Assessment    R Recommendations: See Admitting Provider Note  Report given to:   Additional Notes:   18g R AC. NS @ 100. IV abx. NGT r nare LIWS.

## 2018-08-13 NOTE — ED Notes (Signed)
Date and time results received: 08/13/18 8:24 PM  (use smartphrase ".now" to insert current time)  Test: trop  Critical Value: 0.04  Name of Provider Notified: [email or web address]   Orders Received? Or Actions Taken?:

## 2018-08-13 NOTE — ED Provider Notes (Signed)
[facility] EMERGENCY DEPARTMENT Provider Note   CSN: [id number] Arrival date & time: 08/13/18  1841    History   Chief Complaint Chief Complaint  Patient presents with   abdominal swelling    HPI ADDILYNNE OLHEISER is a 78 y.o. female.     The history is provided by the patient and the EMS personnel. The history is limited by the condition of the patient (very [place]).  Pt was seen at 1900. Per EMS and pt report: Pt called EMS for generalized abd "pains" for the past 3 days. Pt describes the abd pain as "swelling." Pt states she has had decreased appetite and "passing fluid" when asked if she has had vomiting or diarrhea. Denies CP/SOB, no back pain, no fevers, no cough. Pt is very HOH.    Past Medical History:  Diagnosis Date   Anemia    Hgb of 9-10   Anemia of chronic disease    Hgb of 9-10 chronically; 06/2010: H&H-10.7/33.5, MCV-81, normal iron studies in 2010    Arteriosclerotic cardiovascular disease (ASCVD)    Remote PTCA by patient report; LBBB; associated cardiomyopathy, presumed ischemic with EF 40-45% previously, 20% in 06/2009; h/o clinical congestive heart failure; negative stress nuclear in 2009 with inferoseptal and apical scar   Automatic implantable cardioverter-defibrillator in situ    Breast cancer (HCC)    breast   Chronic bronchitis (HCC)    Chronic renal disease, stage 3, moderately decreased glomerular filtration rate (GFR) between 30-59 mL/min/1.73 square meter (HCC) 02/01/2016   Congestive heart failure (CHF) (HCC)    Elevated cholesterol    Elevated sed rate    GERD (gastroesophageal reflux disease)    GI bleed    Gout    HOH (hard of hearing)    Hyperlipidemia    Hypertension    Rheumatoid arthritis ([place])    UTI (urinary tract infection)     Patient Active Problem List   Diagnosis Date Noted   AKI (acute kidney injury) ([place]) 08/10/2017   Hyperkalemia 08/10/2017   Acute blood loss anemia 09/20/2016   Ischemic  cardiomyopathy 09/20/2016   Chronic combined systolic and diastolic CHF (congestive heart failure) ([place]) 09/20/2016   CKD (chronic kidney disease), stage IV ([place]) 09/20/2016   History of breast cancer 08/02/2016   Primary osteoarthritis of both knees 08/02/2016   Primary osteoarthritis of both feet 08/02/2016   History of anemia 07/17/2016   History of CHF (congestive heart failure) 06/04/2016   History of chronic kidney disease 06/04/2016   Symptomatic anemia 06/04/2016   Elevated sedimentation rate 03/03/2016   Idiopathic chronic gout of multiple sites without tophus 03/03/2016   Total knee replacement status, right 03/03/2016   High risk medication use 03/03/2016   Chronic kidney disease (CKD) stage G3b/A1, moderately decreased glomerular filtration rate (GFR) between 30-44 mL/min/1.73 square meter and albuminuria creatinine ratio less than 30 mg/g ([place]) 02/01/2016   CAD S/P remote PCI- no details 09/02/2014   Cardiomyopathy, ischemic-EF 30-35% March 2015 89/38/1017   Diastolic dysfunction-grade 2 09/02/2014   CHF exacerbation ([place]) 08/28/2014   Acute on chronic combined systolic and diastolic congestive heart failure ([place]) 08/28/2014   Iron deficiency anemia 08/20/2013   Malnutrition of moderate degree ([place]) 07/21/2013   PNA (pneumonia) 07/19/2013   HCAP (healthcare-associated pneumonia) 07/17/2013   Sepsis ([place]) 07/17/2013   ARF (acute renal failure) ([place]) 07/17/2013   Rheumatoid arthritis ([place]) 07/04/2013   Hypokalemia 07/03/2013   Chest pain 07/03/2013   Biventricular ICD in place (MDT 2014) 11/06/2012  Anemia of chronic disease    Breast cancer ([place])    Hypertension    Dyslipidemia 00/76/2263   Chronic systolic heart failure ([place]) 05/24/2009   Primary osteoarthritis of both hands 08/11/2007    Past Surgical History:  Procedure Laterality Date   BI-VENTRICULAR IMPLANTABLE CARDIOVERTER DEFIBRILLATOR N/A 07/28/2012   Procedure:  BI-VENTRICULAR IMPLANTABLE CARDIOVERTER DEFIBRILLATOR  (CRT-D);  Surgeon: Evans Lance, MD;  Location: [facility] CATH LAB;  Service: Cardiovascular;  Laterality: N/A;   BI-VENTRICULAR IMPLANTABLE CARDIOVERTER DEFIBRILLATOR  (CRT-D)  07/28/2012   BREAST BIOPSY Bilateral    CATARACT EXTRACTION W/ INTRAOCULAR LENS IMPLANT Left    CATARACT EXTRACTION W/PHACO Right 09/15/2013   Procedure: CATARACT EXTRACTION PHACO AND INTRAOCULAR LENS PLACEMENT (Eugenio Saenz);  Surgeon: Elta Guadeloupe T. Gershon Crane, MD;  Location: [facility] ORS;  Service: Ophthalmology;  Laterality: Right;  CDE:  10.74   COLONOSCOPY N/A 03/04/2015   Procedure: COLONOSCOPY;  Surgeon: Rogene Houston, MD;  Location: [facility] ENDO SUITE;  Service: Endoscopy;  Laterality: N/A;  10:50    ESOPHAGOGASTRODUODENOSCOPY N/A 03/04/2015   Procedure: ESOPHAGOGASTRODUODENOSCOPY (EGD);  Surgeon: Rogene Houston, MD;  Location: [facility] ENDO SUITE;  Service: Endoscopy;  Laterality: N/A;   ESOPHAGOGASTRODUODENOSCOPY N/A 09/21/2016   Procedure: ESOPHAGOGASTRODUODENOSCOPY (EGD);  Surgeon: Rogene Houston, MD;  Location: [facility] END[facility];  Service: Endoscopy;  Laterality: N/A;   GIVENS CAPSULE STUDY N/A 09/22/2016   Procedure: GIVENS CAPSULE STUDY;  Surgeon: Rogene Houston, MD;  Location: [facility] END[facility];  Service: Endoscopy;  Laterality: N/A;   MASTECTOMY Left 1998   TENDON TRANSFER Right 08/15/2017   Procedure: TENDON TRANSFER RIGHT WRIST EXTENSORS AS NEEDED, ULNA RESECTION AND STABILIZATION;  Surgeon: Charlotte Crumb, MD;  Location: [place];  Service: Orthopedics;  Laterality: Right;   TOTAL KNEE ARTHROPLASTY Right    Dr.Harrison   TUBAL LIGATION       OB History    Gravida  1   Para  1   Term  1   Preterm      AB      Living  0     SAB      TAB      Ectopic      Multiple      Live Births               Home Medications    Prior to Admission medications   Medication Sig Start Date End Date Taking? Authorizing Provider  acetaminophen (TYLENOL) 500 MG tablet  Take 500 mg by mouth every 6 (six) hours as needed (FOR PAIN/HEADACHES).   Yes [provider]  allopurinol (ZYLOPRIM) 100 MG tablet TAKE 1 TABLET (100 MG TOTAL) BY MOUTH DAILY. 12/11/16 08/13/18 Yes Deveshwar, Abel Presto, MD  carvedilol (COREG) 6.25 MG tablet TAKE 1 TABLET BY MOUTH TWICE A DAY WITH MEALS 05/16/17  Yes Herminio Commons, MD  colchicine (COLCRYS) 0.6 MG tablet Take 0.6 mg by mouth daily as needed.    Yes [provider]  furosemide (LASIX) 40 MG tablet Take 40 mg by mouth daily.  08/23/17  Yes [provider]  hydroxychloroquine (PLAQUENIL) 200 MG tablet Take 1 tablet by mouth daily Monday through Friday only 07/11/18  Yes Deveshwar, Shaili, MD  KLOR-CON M20 20 MEQ tablet Take 20 mEq by mouth daily.  01/22/18  Yes [provider]  lovastatin (MEVACOR) 40 MG tablet TAKE 1 TABLET (40 MG TOTAL) BY MOUTH AT BEDTIME. 05/16/17  Yes Herminio Commons, MD  Magnesium 500 MG CAPS Take 500 mg by mouth  at bedtime.   Yes [provider]  pantoprazole (PROTONIX) 20 MG tablet Take 1 tablet (20 mg total) by mouth daily. Patient taking differently: Take 20 mg by mouth 2 (two) times daily.  11/20/16  Yes Lendon Colonel, NP    Family History Family History  Problem Relation Age of Onset   Diabetes Father    Pancreatic cancer Father    Hypertension Brother     Social History Social History   Tobacco Use   Smoking status: Former Smoker    Packs/day: 0.25    Years: 30.00    Pack years: 7.50    Types: Cigarettes    Start date: 09/17/1956    Last attempt to quit: 04/24/2007    Years since quitting: 11.3   Smokeless tobacco: Current User    Types: Chew   Tobacco comment: 07/03/2013 "smoked some; don't know how much or how long or when I quit"  Substance Use Topics   Alcohol use: No    Alcohol/week: 0.0 standard drinks   Drug use: No     Allergies   Macrobid [nitrofurantoin macrocrystal]   Review of Systems Review of Systems    Unable to perform ROS: Other     Physical Exam Updated Vital Signs BP 140/62    Pulse (!) 106    Temp 98.1 F (36.7 C) (Oral)    Resp (!) 30    Wt 66.7 kg    SpO2 96%    BMI 24.47 kg/m    Patient Vitals for the past 24 hrs:  BP Temp Temp src Pulse Resp SpO2 Weight  08/13/18 2000 -- -- -- -- -- -- 66.7 kg  08/13/18 1900 140/62 -- -- (!) 106 (!) 30 96 % --  08/13/18 1847 134/75 98.1 F (36.7 C) Oral (!) 107 (!) 34 95 % --     Physical Exam 1905: Physical examination:  Nursing notes reviewed; Vital signs and O2 SAT reviewed;  Constitutional: Well developed, Well nourished, In no acute distress; Head:  Normocephalic, atraumatic; Eyes: EOMI, PERRL, No scleral icterus; ENMT: Mouth and pharynx normal, Mucous membranes dry; Neck: Supple, Full range of motion, No lymphadenopathy; Cardiovascular: Regular rate and rhythm, No gallop; Respiratory: Breath sounds clear & equal bilaterally, No wheezes.  Speaking full sentences with ease, Normal respiratory effort/excursion; Chest: Nontender, Movement normal; Abdomen:  +diffuse tenderness, +softly distended, decreased bowel sounds; Genitourinary: No CVA tenderness; Extremities: Peripheral pulses normal, No tenderness, +1 pedal edema bilat. No calf asymmetry.; Neuro: AA&Ox3, +very HOH. No facial droop. Speech clear. Moves all extremities spontaneously and to command without apparent gross focal motor deficits in extremities.; Skin: Color normal, Warm, Dry.   ED Treatments / Results  Labs (all labs ordered are listed, but only abnormal results are displayed)   EKG EKG Interpretation  Date/Time:  Wednesday August 13 2018 18:50:34 EDT Ventricular Rate:  107 PR Interval:    QRS Duration: 107 QT Interval:  325 QTC Calculation: 434 R Axis:   -84 Text Interpretation:  Atrial-sensed ventricular-paced rhythm No further analysis attempted due to paced rhythm When compared with ECG of 08/10/2017 No significant change was found Confirmed by Francine Graven [phone]) on 08/13/2018 7:34:00 PM   Radiology   Procedures Procedures (including critical care time)  Medications Ordered in ED Medications  iohexol (OMNIPAQUE) 300 MG/ML solution 100 mL (has no administration in time range)     Initial Impression / Assessment and Plan / ED Course  I have reviewed the triage vital signs and the  nursing notes.  Pertinent labs & imaging results that were available during my care of the patient were reviewed by me and considered in my medical decision making (see chart for details).     MDM Reviewed: previous chart, nursing note and vitals Reviewed previous: labs and ECG Interpretation: labs, ECG, x-ray and CT scan   Results for orders placed or performed during the hospital encounter of 08/13/18  Comprehensive metabolic panel  Result Value Ref Range   Sodium 134 (L) 135 - 145 mmol/L   Potassium 4.0 3.5 - 5.1 mmol/L   Chloride 97 (L) 98 - 111 mmol/L   CO2 22 22 - 32 mmol/L   Glucose, Bld 120 (H) 70 - 99 mg/dL   BUN 65 (H) 8 - 23 mg/dL   Creatinine, Ser 3.21 (H) 0.44 - 1.00 mg/dL   Calcium 8.8 (L) 8.9 - 10.3 mg/dL   Total Protein 8.4 (H) 6.5 - 8.1 g/dL   Albumin 2.8 (L) 3.5 - 5.0 g/dL   AST 21 15 - 41 U/L   ALT 17 0 - 44 U/L   Alkaline Phosphatase 129 (H) 38 - 126 U/L   Total Bilirubin 1.3 (H) 0.3 - 1.2 mg/dL   GFR calc non Af Amer 13 (L) >60 mL/min   GFR calc Af Amer 15 (L) >60 mL/min   Anion gap 15 5 - 15  Lipase, blood  Result Value Ref Range   Lipase 30 11 - 51 U/L  Brain natriuretic peptide  Result Value Ref Range   B Natriuretic Peptide 404.0 (H) 0.0 - 100.0 pg/mL  Troponin I - Once  Result Value Ref Range   Troponin I 0.04 (HH) <0.03 ng/mL  CBC with Differential  Result Value Ref Range   WBC 21.9 (H) 4.0 - 10.5 K/uL   RBC 4.10 3.87 - 5.11 MIL/uL   Hemoglobin 10.8 (L) 12.0 - 15.0 g/dL   HCT 34.0 (L) 36.0 - 46.0 %   MCV 82.9 80.0 - 100.0 fL   MCH 26.3 26.0 - 34.0 pg   MCHC 31.8 30.0 - 36.0 g/dL   RDW 19.6 (H)  11.5 - 15.5 %   Platelets 211 150 - 400 K/uL   nRBC 0.0 0.0 - 0.2 %   Neutrophils Relative % 80 %   Neutro Abs 17.6 (H) 1.7 - 7.7 K/uL   Lymphocytes Relative 7 %   Lymphs Abs 1.6 0.7 - 4.0 K/uL   Monocytes Relative 12 %   Monocytes Absolute 2.5 (H) 0.1 - 1.0 K/uL   Eosinophils Relative 0 %   Eosinophils Absolute 0.1 0.0 - 0.5 K/uL   Basophils Relative 0 %   Basophils Absolute 0.0 0.0 - 0.1 K/uL   Immature Granulocytes 1 %   Abs Immature Granulocytes 0.17 (H) 0.00 - 0.07 K/uL  Protime-INR  Result Value Ref Range   Prothrombin Time 18.7 (H) 11.4 - 15.2 seconds   INR 1.6 (H) 0.8 - 1.2  Urinalysis, Routine w reflex microscopic  Result Value Ref Range   Color, Urine AMBER (A) YELLOW   APPearance CLOUDY (A) CLEAR   Specific Gravity, Urine 1.016 1.005 - 1.030   pH 5.0 5.0 - 8.0   Glucose, UA NEGATIVE NEGATIVE mg/dL   Hgb urine dipstick MODERATE (A) NEGATIVE   Bilirubin Urine NEGATIVE NEGATIVE   Ketones, ur NEGATIVE NEGATIVE mg/dL   Protein, ur 30 (A) NEGATIVE mg/dL   Nitrite NEGATIVE NEGATIVE   Leukocytes,Ua TRACE (A) NEGATIVE   RBC / HPF 0-5 0 - 5 RBC/hpf  WBC, UA 0-5 0 - 5 WBC/hpf   Bacteria, UA MANY (A) NONE SEEN   Squamous Epithelial / LPF 0-5 0 - 5   Hyaline Casts, UA PRESENT    Dg Chest Port 1 View Result Date: 08/13/2018 CLINICAL DATA:  Abdominal pain EXAM: PORTABLE CHEST 1 VIEW COMPARISON:  12/13/2015 FINDINGS: Left AICD remains in place, unchanged. Cardiomegaly with vascular congestion. Suspect early interstitial edema. Low volumes with bibasilar atelectasis and small effusions. No acute bony abnormality. IMPRESSION: Findings compatible with mild edema/CHF. Low volumes with bibasilar atelectasis and small effusions. Electronically Signed   By: Rolm Baptise M.D.   On: 08/13/2018 19:38    Ct Abdomen Pelvis Wo Contrast Result Date: 08/13/2018 CLINICAL DATA:  Abdominal swelling poor appetite EXAM: CT ABDOMEN AND PELVIS WITHOUT CONTRAST TECHNIQUE: Multidetector CT imaging of  the abdomen and pelvis was performed following the standard protocol without IV contrast. COMPARISON:  Report 04/25/2001 FINDINGS: Lower chest: Lung bases demonstrate no pleural effusion. Atelectasis or mild aspiration at the right base. Cardiomegaly with partially visualized intracardiac pacing leads. Coronary vascular calcification. Small moderate hiatal hernia. Hepatobiliary: Multiple calcified gallstones. Mildly lobulated liver margin. No focal hepatic abnormality or biliary dilatation. Pancreas: Unremarkable. No pancreatic ductal dilatation or surrounding inflammatory changes. Spleen: Normal in size without focal abnormality. Adrenals/Urinary Tract: Adrenal glands are within normal limits. No hydronephrosis. Probable cyst in the mid to lower right kidney. The bladder is unremarkable. Stomach/Bowel: Fluid-filled stomach. Multiple loops of fluid-filled dilated small bowel measuring up to 3.4 cm. Distal small bowel is decompressed as is the colon. Poorly identified transition point due to lack of enteral contrast but suspected to be within the distal small bowel/pelvis. No intramural air. No definite bowel wall thickening. Vascular/Lymphatic: Extensive aortic atherosclerosis. No aneurysm. No significant adenopathy Reproductive: Uterus unremarkable. 3 x 2.4 cm hyperdense/partially calcified left pelvic sidewall mass. Other: No free air. Slightly dense fluid within the posterior pelvis. Musculoskeletal: Grade 1 anterolisthesis L4 on L5. Mild to moderate superior endplate deformity at L4, new since 2017 comparison radiographs. IMPRESSION: 1. Multiple loops of dilated fluid-filled small bowel, consistent with mechanical small bowel obstruction. Poorly identified transition point due to lack of contrast, suspect that transition point with in the distal ileum in the pelvis, given collapsed appearance of the terminal ileum and colon. No free air at this time. 2. Trace amount of slightly dense fluid within the pelvis, may  reflect a small amount of hemorrhagic ascites. 3. Partially calcified/hyperdense 3 cm left pelvic sidewall mass, possibly a lymph node or a left adnexal mass. 4. Mild consolidation at the right lung base which may be secondary to atelectasis, mild pneumonia or aspiration. 5. Cardiomegaly 6. Gallstones.  Slightly lobulated liver margin, question cirrhosis Electronically Signed   By: Donavan Foil M.D.   On: 08/13/2018 21:25     Results for ANNTONETTE, MADEWELL (MRN [id number]) as of 08/13/2018 20:56  Ref. Range 12/05/2017 14:12 02/13/2018 11:42 03/24/2018 12:58 05/09/2018 10:42 08/13/2018 19:26  Hemoglobin Latest Ref Range: 12.0 - 15.0 g/dL 9.6 (L) 9.9 (L) 10.6 (L) 9.9 (L) 10.8 (L)  HCT Latest Ref Range: 36.0 - 46.0 % 31.0 (L) 31.3 (L) 35.3 (L) 33.8 (L) 34.0 (L)    Results for EZELLE, SURPRENANT (MRN [id number]) as of 08/13/2018 20:56  Ref. Range 12/05/2017 14:12 02/13/2018 11:42 03/24/2018 12:58 05/09/2018 10:42 08/13/2018 19:26  BUN Latest Ref Range: 8 - 23 mg/dL 28 (H) 27 (H) 29 (H) 17 65 (H)  Creatinine Latest Ref Range: 0.44 - 1.00 mg/dL 1.32 (  H) 1.07 (H) 1.17 (H) 1.11 (H) 3.21 (H)     1945:  ICD interrogated and was significant for: no arrhythmias/tachycardic episodes, fluid impedence elevated/possible dry status (see full report for further details).   2145:  Given leukocytosis and possible infiltrate right lung on CT scan; IV abx given. No vomiting while in the ED. H/H per baseline. BUN/Cr elevated; judicious IVF given. T/C returned from [facility] Dr. Arnoldo Morale, case discussed, including:  HPI, pertinent PM/SHx, VS/PE, dx testing, ED course and treatment:  No acute surgical issue at this time, pt however is high risk surgical candidate for [facility] if need arises, place NGT and he is agreeable to consult tomorrow,   2210:  T/C returned from [facility] Dr. Olevia Bowens, case discussed, including:  HPI, pertinent PM/SHx, VS/PE, dx testing, ED course and treatment:  Agreeable to admit.    Final Clinical Impressions(s) /  ED Diagnoses   Final diagnoses:  None    ED Discharge Orders    None       Francine Graven, DO 08/18/18 1837

## 2018-08-13 NOTE — ED Notes (Signed)
Pt returned form ct.  

## 2018-08-13 NOTE — ED Notes (Addendum)
[employer]- device ok- no arrhythmia since Jan 22 2018.  Fluid impedence elevated possible dry status. 871 vent sensing episodes. Most recent today. Awaiting fax from [employer].

## 2018-08-13 NOTE — H&P (Signed)
History and Physical    [name]:[id number] DOB: 06-25-1940 DOA: 08/13/2018  PCP: [name], MD   Patient coming from: Home.  I have personally briefly reviewed patient's old medical records in [place]  Chief Complaint: Abdominal pain.  HPI: [name] is a 78 y.o. female with medical history significant of anemia chronic disease, ASCVD, AICD placement, history of breast cancer, chronic bronchitis, stage III chronic renal disease, systolic heart failure, hyperlipidemia, GERD, history of GI bleed, gout, hypertension, rheumatoid arthritis, impaired hearing, history of UTI who is coming to the emergency department with complaints of abdominal pain apparently for the past 3 days associated with decreased appetite, several episodes of diarrhea, nausea, but no emesis.  She denies fever, cough, sore throat, chest pain, dysuria, frequency, hematuria, melena or hematochezia.  She denies polyuria, polydipsia, polyphagia or blurred vision.  History is limited by the patient severe hearing impairment.  ED Course: Initial vital signs temperature 98.1 F, pulse 107, respirations 34, blood pressure 134/75 mmHg and O2 sat 95% on room air.  The patient received ceftriaxone 1000 mg and doxycycline 100 mg IVPB x1 in the ED.  Blood cultures x2 were drawn.  Urinalysis showed cloudy appearance, with moderate hemoglobinuria, proteinuria of 30 mg/dL, trace leukocyte esterase and many bacteria on microscopic examination.  Her CBC showed a white count of 21.9 with 80% neutrophils, 7% lymphocytes and 12% monocytes.  Myoglobin was 10.8 g/dL and platelets 211.  PT was 18.7 seconds and INR 1.6.  BNP was 404 pg/mL.  Troponin was 0.04 ng/mL.  Sodium 134, potassium 4.0, chloride 97 and CO2 22 mmol/L.  Glucose 120, BUN 65, creatinine 3.21 and calcium 8.8 mg/dL.  Total protein was 8.4 and albumin 2.8 g/dL.  AST and ALT were normal.  Alkaline phosphatase was 129 and lipase was 30 units/L.  Total bilirubin was  1.3 mg/dL.  Imaging: Chest radiograph shows low volumes with bibasilar atelectasis.  CT abdomen/pelvis with contrast shows multiple loops of dilated fluid-filled small bowel, consistent with mechanical small bowel obstruction.  Poorly identified transition point with the distal ileum in the pelvis, given collapse appearance of terminal ileum and colon.  There is no free air at this time.  There is trace amount of slightly dense fluid within the pelvis, IV small amount of hemorrhagic ascites.  Small consolidation in the right lung base which may be secondary to atelectasis, pneumonia or aspiration.  There is cardiomegaly.  There is gallstones.  Follow-up chest radiograph show NG tube tip in the proximal stomach.  Please see images and full radiology report for further detail.  Review of Systems: As per HPI otherwise 10 point review of systems negative.   Past Medical History:  Diagnosis Date   Anemia    Hgb of 9-10   Anemia of chronic disease    Hgb of 9-10 chronically; 06/2010: H&H-10.7/33.5, MCV-81, normal iron studies in 2010    Arteriosclerotic cardiovascular disease (ASCVD)    Remote PTCA by patient report; LBBB; associated cardiomyopathy, presumed ischemic with EF 40-45% previously, 20% in 06/2009; h/o clinical congestive heart failure; negative stress nuclear in 2009 with inferoseptal and apical scar   Automatic implantable cardioverter-defibrillator in situ    Breast cancer (HCC)    breast   Chronic bronchitis (HCC)    Chronic renal disease, stage 3, moderately decreased glomerular filtration rate (GFR) between 30-59 mL/min/1.73 square meter ([place]) 02/01/2016   Congestive heart failure (CHF) (HCC)    Elevated cholesterol    Elevated sed  rate    GERD (gastroesophageal reflux disease)    GI bleed    Gout    HOH (hard of hearing)    Hyperlipidemia    Hypertension    Rheumatoid arthritis ([place])    UTI (urinary tract infection)     Past Surgical History:  Procedure  Laterality Date   BI-VENTRICULAR IMPLANTABLE CARDIOVERTER DEFIBRILLATOR N/A 07/28/2012   Procedure: BI-VENTRICULAR IMPLANTABLE CARDIOVERTER DEFIBRILLATOR  (CRT-D);  Surgeon: Evans Lance, MD;  Location[facility] CATH LAB;  Service: Cardiovascular;  Laterality: N/A;   BI-VENTRICULAR IMPLANTABLE CARDIOVERTER DEFIBRILLATOR  (CRT-D)  07/28/2012   BREAST BIOPSY Bilateral    CATARACT EXTRACTION W/ INTRAOCULAR LENS IMPLANT Left    CATARACT EXTRACTION W/PHACO Right 09/15/2013   Procedure: CATARACT EXTRACTION PHACO AND INTRAOCULAR LENS PLACEMENT ([place]);  Surgeon: Elta Guadeloupe T. Gershon Crane, MD;  Location: [facility] ORS;  Service: Ophthalmology;  Laterality: Right;  CDE:  10.74   COLONOSCOPY N/A 03/04/2015   Procedure: COLONOSCOPY;  Surgeon: Rogene Houston, MD;  Location: [facility] ENDO SUITE;  Service: Endoscopy;  Laterality: N/A;  10:50    ESOPHAGOGASTRODUODENOSCOPY N/A 03/04/2015   Procedure: ESOPHAGOGASTRODUODENOSCOPY (EGD);  Surgeon: Rogene Houston, MD;  Location: [facility] END[facility];  Service: Endoscopy;  Laterality: N/A;   ESOPHAGOGASTRODUODENOSCOPY N/A 09/21/2016   Procedure: ESOPHAGOGASTRODUODENOSCOPY (EGD);  Surgeon: Rogene Houston, MD;  Location: [facility];  Service: Endoscopy;  Laterality: N/A;   GIVENS CAPSULE STUDY N/A 09/22/2016   Procedure: GIVENS CAPSULE STUDY;  Surgeon: Rogene Houston, MD;  Location: [facility];  Service: Endoscopy;  Laterality: N/A;   MASTECTOMY Left 1998   TENDON TRANSFER Right 08/15/2017   Procedure: TENDON TRANSFER RIGHT WRIST EXTENSORS AS NEEDED, ULNA RESECTION AND STABILIZATION;  Surgeon: Charlotte Crumb, MD;  Location: [place];  Service: Orthopedics;  Laterality: Right;   TOTAL KNEE ARTHROPLASTY Right    Dr.Harrison   TUBAL LIGATION       reports that she quit smoking about 11 years ago. Her smoking use included cigarettes. She started smoking about 61 years ago. She has a 7.50 pack-year smoking history. Her smokeless tobacco use includes chew. She reports that she does not drink  alcohol or use drugs.  Allergies  Allergen Reactions   Macrobid [Nitrofurantoin Macrocrystal] Hives and Itching    Family History  Problem Relation Age of Onset   Diabetes Father    Pancreatic cancer Father    Hypertension Brother    Prior to Admission medications   Medication Sig Start Date End Date Taking? Authorizing Provider  acetaminophen (TYLENOL) 500 MG tablet Take 500 mg by mouth every 6 (six) hours as needed (FOR PAIN/HEADACHES).   Yes [provider]  allopurinol (ZYLOPRIM) 100 MG tablet TAKE 1 TABLET (100 MG TOTAL) BY MOUTH DAILY. 12/11/16 08/13/18 Yes Deveshwar, Abel Presto, MD  carvedilol (COREG) 6.25 MG tablet TAKE 1 TABLET BY MOUTH TWICE A DAY WITH MEALS 05/16/17  Yes Herminio Commons, MD  colchicine (COLCRYS) 0.6 MG tablet Take 0.6 mg by mouth daily as needed.    Yes [provider]  furosemide (LASIX) 40 MG tablet Take 40 mg by mouth daily.  08/23/17  Yes [provider]  hydroxychloroquine (PLAQUENIL) 200 MG tablet Take 1 tablet by mouth daily Monday through Friday only 07/11/18  Yes Deveshwar, Shaili, MD  KLOR-CON M20 20 MEQ tablet Take 20 mEq by mouth daily.  01/22/18  Yes [provider]  lovastatin (MEVACOR) 40 MG tablet TAKE 1 TABLET (40 MG TOTAL) BY MOUTH AT BEDTIME. 05/16/17  Yes  Herminio Commons, MD  Magnesium 500 MG CAPS Take 500 mg by mouth at bedtime.   Yes [provider]  pantoprazole (PROTONIX) 20 MG tablet Take 1 tablet (20 mg total) by mouth daily. Patient taking differently: Take 20 mg by mouth 2 (two) times daily.  11/20/16  Yes Lendon Colonel, NP    Physical Exam: Vitals:   08/13/18 2100 08/13/18 2130 08/13/18 2145 08/13/18 2200  BP: (!) 147/61 (!) 149/63  132/67  Pulse:      Resp: 20  (!) 30   Temp:      TempSrc:      SpO2:      Weight:        Constitutional: NAD, calm, comfortable Eyes: PERRL, lids and conjunctivae are mildly injected. ENMT: Severely impaired hearing.  NGT in place.   Mucous membranes are dry. Posterior pharynx clear of any exudate or lesions. Neck: normal, supple, no masses, no thyromegaly Respiratory: Decreased breath sounds on bases, no wheezing, no crackles. Normal respiratory effort. No accessory muscle use.  Cardiovascular: Regular rate and rhythm, no murmurs / rubs / gallops. No extremity edema. 2+ pedal pulses. No carotid bruits.  Abdomen: Distended. Bowel sounds positive.  Soft, positive epigastric and periumbilical tenderness, no guarding or rebound, no masses palpated. No hepatosplenomegaly.  Musculoskeletal: no clubbing / cyanosis. Good ROM, no contractures. Normal muscle tone.  Skin: no rashes, lesions, ulcers. No induration on very limited dermatological examination. Neurologic: CN 2-12 grossly intact. Sensation intact, DTR normal. Strength 5/5 in all 4.  Psychiatric: Alert and oriented x 3. Normal mood.   Labs on Admission: I have personally reviewed following labs and imaging studies  CBC: Recent Labs  Lab 08/13/18 1926  WBC 21.9*  NEUTROABS 17.6*  HGB 10.8*  HCT 34.0*  MCV 82.9  PLT 878   Basic Metabolic Panel: Recent Labs  Lab 08/13/18 1926  NA 134*  K 4.0  CL 97*  CO2 22  GLUCOSE 120*  BUN 65*  CREATININE 3.21*  CALCIUM 8.8*   GFR: Estimated Creatinine Clearance: 12.8 mL/min (A) (by C-G formula based on SCr of 3.21 mg/dL (H)). Liver Function Tests: Recent Labs  Lab 08/13/18 1926  AST 21  ALT 17  ALKPHOS 129*  BILITOT 1.3*  PROT 8.4*  ALBUMIN 2.8*   Recent Labs  Lab 08/13/18 1926  LIPASE 30   No results for input(s): AMMONIA in the last 168 hours. Coagulation Profile: Recent Labs  Lab 08/13/18 1926  INR 1.6*   Cardiac Enzymes: Recent Labs  Lab 08/13/18 1926  TROPONINI 0.04*   BNP (last 3 results) No results for input(s): PROBNP in the last 8760 hours. HbA1C: No results for input(s): HGBA1C in the last 72 hours. CBG: No results for input(s): GLUCAP in the last 168 hours. Lipid Profile: No  results for input(s): CHOL, HDL, LDLCALC, TRIG, CHOLHDL, LDLDIRECT in the last 72 hours. Thyroid Function Tests: No results for input(s): TSH, T4TOTAL, FREET4, T3FREE, THYROIDAB in the last 72 hours. Anemia Panel: No results for input(s): VITAMINB12, FOLATE, FERRITIN, TIBC, IRON, RETICCTPCT in the last 72 hours. Urine analysis:    Component Value Date/Time   COLORURINE AMBER (A) 08/13/2018 1849   APPEARANCEUR CLOUDY (A) 08/13/2018 1849   LABSPEC 1.016 08/13/2018 1849   PHURINE 5.0 08/13/2018 1849   GLUCOSEU NEGATIVE 08/13/2018 1849   HGBUR MODERATE (A) 08/13/2018 1849   BILIRUBINUR NEGATIVE 08/13/2018 1849   KETONESUR NEGATIVE 08/13/2018 1849   PROTEINUR 30 (A) 08/13/2018 1849   UROBILINOGEN 0.2 07/17/2013  [place] 08/13/2018 [place] (A) 08/13/2018 1849    Radiological Exams on Admission: Ct Abdomen Pelvis Wo Contrast  Result Date: 08/13/2018 CLINICAL DATA:  Abdominal swelling poor appetite EXAM: CT ABDOMEN AND PELVIS WITHOUT CONTRAST TECHNIQUE: Multidetector CT imaging of the abdomen and pelvis was performed following the standard protocol without IV contrast. COMPARISON:  Report 04/25/2001 FINDINGS: Lower chest: Lung bases demonstrate no pleural effusion. Atelectasis or mild aspiration at the right base. Cardiomegaly with partially visualized intracardiac pacing leads. Coronary vascular calcification. Small moderate hiatal hernia. Hepatobiliary: Multiple calcified gallstones. Mildly lobulated liver margin. No focal hepatic abnormality or biliary dilatation. Pancreas: Unremarkable. No pancreatic ductal dilatation or surrounding inflammatory changes. Spleen: Normal in size without focal abnormality. Adrenals/Urinary Tract: Adrenal glands are within normal limits. No hydronephrosis. Probable cyst in the mid to lower right kidney. The bladder is unremarkable. Stomach/Bowel: Fluid-filled stomach. Multiple loops of fluid-filled dilated small bowel measuring up to  3.4 cm. Distal small bowel is decompressed as is the colon. Poorly identified transition point due to lack of enteral contrast but suspected to be within the distal small bowel/pelvis. No intramural air. No definite bowel wall thickening. Vascular/Lymphatic: Extensive aortic atherosclerosis. No aneurysm. No significant adenopathy Reproductive: Uterus unremarkable. 3 x 2.4 cm hyperdense/partially calcified left pelvic sidewall mass. Other: No free air. Slightly dense fluid within the posterior pelvis. Musculoskeletal: Grade 1 anterolisthesis L4 on L5. Mild to moderate superior endplate deformity at L4, new since 2017 comparison radiographs. IMPRESSION: 1. Multiple loops of dilated fluid-filled small bowel, consistent with mechanical small bowel obstruction. Poorly identified transition point due to lack of contrast, suspect that transition point with in the distal ileum in the pelvis, given collapsed appearance of the terminal ileum and colon. No free air at this time. 2. Trace amount of slightly dense fluid within the pelvis, may reflect a small amount of hemorrhagic ascites. 3. Partially calcified/hyperdense 3 cm left pelvic sidewall mass, possibly a lymph node or a left adnexal mass. 4. Mild consolidation at the right lung base which may be secondary to atelectasis, mild pneumonia or aspiration. 5. Cardiomegaly 6. Gallstones.  Slightly lobulated liver margin, question cirrhosis Electronically Signed   By: Donavan Foil M.D.   On: 08/13/2018 21:25   Dg Chest Port 1 View  Result Date: 08/13/2018 CLINICAL DATA:  Abdominal pain EXAM: PORTABLE CHEST 1 VIEW COMPARISON:  12/13/2015 FINDINGS: Left AICD remains in place, unchanged. Cardiomegaly with vascular congestion. Suspect early interstitial edema. Low volumes with bibasilar atelectasis and small effusions. No acute bony abnormality. IMPRESSION: Findings compatible with mild edema/CHF. Low volumes with bibasilar atelectasis and small effusions. Electronically  Signed   By: Rolm Baptise M.D.   On: 08/13/2018 19:38   Dg Chest Port 1v Same Day  Result Date: 08/13/2018 CLINICAL DATA:  Is NG tube placement EXAM: PORTABLE CHEST 1 VIEW COMPARISON:  08/13/2018 FINDINGS: NG tube is in place with the tip in the proximal stomach. Left AICD is unchanged. Cardiomegaly, bibasilar atelectasis. No overt edema or visible effusions. No acute bony abnormality. IMPRESSION: NG tube tip in the proximal stomach. Cardiomegaly, bibasilar atelectasis. Electronically Signed   By: Rolm Baptise M.D.   On: 08/13/2018 22:31    EKG: Independently reviewed.  Vent. rate 107 BPM PR interval * ms QRS duration 107 ms QT/QTc 325/434 ms P-R-T axes 249 -84 74 Atrial-sensed ventricular-paced rhythm No further analysis attempted due to paced rhythm  Assessment/Plan Principal Problem:   CAP (community acquired pneumonia) Admit to stepdown  unit/inpatient. Continue supplemental oxygen. Careful and time-limited IV hydration. Continue ceftriaxone 1 g IVPB every 24 hours. Continue doxycycline 100 mg IVPB every 12 hours. Bronchodilators as needed. Check strep pneumonia urinary antigen. Check sputum Gram stain, culture and sensitivity. Follow-up blood culture and sensitivity.  Active Problems:   SBO (small bowel obstruction) (HCC) Keep n.p.o. Continue careful and time-limited IV fluids. Continue NGT suctioning. General surgery evaluation in a.m.    AKI (acute kidney injury) ([place]) Continue careful and time-limited IV hydration. Monitor intake and output. Monitor renal function and electrolytes.    Chronic combined systolic and diastolic CHF (congestive heart failure) (HCC) Seems to be volume depleted at this time. Monitor intake and output. Resume carvedilol once clear for oral intake. Resume maintenance furosemide once SBO and volume depletion resolved.    CAD S/P remote PCI- no details Holding carvedilol and lovastatin at this time.    Dyslipidemia Resume statin once  clear for oral intake.    Anemia of chronic disease Monitor hematocrit and hemoglobin.    DVT prophylaxis: Lovenox SQ. Code Status: Full code. Family Communication: Disposition Plan: Admit for IV antibiotic therapy, SBO NG suction and careful IV hydration. Consults called: Routine general surgery consult. Admission status: Inpatient/stepdown.   Reubin Milan MD [facility]  08/13/2018, 10:40 PM   This document was prepared using Dragon voice recognition software and may contain some unintended transcription errors.

## 2018-08-13 NOTE — ED Notes (Signed)
Called ac for med 

## 2018-08-13 NOTE — ED Notes (Addendum)
ICD INFORMATION:::::    PHYSICIAN:  Champ Mungo. Lovena Le, MD    DATE OF PROCEDURE:  07/28/2012   The Medtronic model 6935 58 cm active fixation defibrillation lead, serial number [id number] was advanced into the right ventricle and a Medtronic model 5076 45 cm active fixation pacing lead serial number [id number] was advanced to the right atrium.

## 2018-08-14 ENCOUNTER — Encounter (HOSPITAL_COMMUNITY): Payer: Self-pay

## 2018-08-14 ENCOUNTER — Other Ambulatory Visit: Payer: Self-pay

## 2018-08-14 DIAGNOSIS — E785 Hyperlipidemia, unspecified: Secondary | ICD-10-CM

## 2018-08-14 DIAGNOSIS — K56609 Unspecified intestinal obstruction, unspecified as to partial versus complete obstruction: Secondary | ICD-10-CM | POA: Diagnosis present

## 2018-08-14 DIAGNOSIS — L8990 Pressure ulcer of unspecified site, unspecified stage: Secondary | ICD-10-CM

## 2018-08-14 DIAGNOSIS — D638 Anemia in other chronic diseases classified elsewhere: Secondary | ICD-10-CM

## 2018-08-14 LAB — CBC WITH DIFFERENTIAL/PLATELET
Abs Immature Granulocytes: 0.09 10*3/uL — ABNORMAL HIGH (ref 0.00–0.07)
Basophils Absolute: 0 10*3/uL (ref 0.0–0.1)
Basophils Relative: 0 %
Eosinophils Absolute: 0 10*3/uL (ref 0.0–0.5)
Eosinophils Relative: 0 %
HCT: 31.2 % — ABNORMAL LOW (ref 36.0–46.0)
Hemoglobin: 9.8 g/dL — ABNORMAL LOW (ref 12.0–15.0)
Immature Granulocytes: 1 %
Lymphocytes Relative: 7 %
Lymphs Abs: 1.1 10*3/uL (ref 0.7–4.0)
MCH: 26.2 pg (ref 26.0–34.0)
MCHC: 31.4 g/dL (ref 30.0–36.0)
MCV: 83.4 fL (ref 80.0–100.0)
Monocytes Absolute: 1.9 10*3/uL — ABNORMAL HIGH (ref 0.1–1.0)
Monocytes Relative: 11 %
Neutro Abs: 14.5 10*3/uL — ABNORMAL HIGH (ref 1.7–7.7)
Neutrophils Relative %: 81 %
Platelets: 193 10*3/uL (ref 150–400)
RBC: 3.74 MIL/uL — ABNORMAL LOW (ref 3.87–5.11)
RDW: 19.6 % — ABNORMAL HIGH (ref 11.5–15.5)
WBC: 17.7 10*3/uL — ABNORMAL HIGH (ref 4.0–10.5)
nRBC: 0 % (ref 0.0–0.2)

## 2018-08-14 LAB — COMPREHENSIVE METABOLIC PANEL
ALT: 18 U/L (ref 0–44)
AST: 34 U/L (ref 15–41)
Albumin: 2.5 g/dL — ABNORMAL LOW (ref 3.5–5.0)
Alkaline Phosphatase: 130 U/L — ABNORMAL HIGH (ref 38–126)
Anion gap: 13 (ref 5–15)
BUN: 63 mg/dL — ABNORMAL HIGH (ref 8–23)
CO2: 22 mmol/L (ref 22–32)
Calcium: 8.4 mg/dL — ABNORMAL LOW (ref 8.9–10.3)
Chloride: 104 mmol/L (ref 98–111)
Creatinine, Ser: 2.19 mg/dL — ABNORMAL HIGH (ref 0.44–1.00)
GFR calc Af Amer: 24 mL/min — ABNORMAL LOW (ref >60)
GFR calc non Af Amer: 21 mL/min — ABNORMAL LOW (ref >60)
Glucose, Bld: 107 mg/dL — ABNORMAL HIGH (ref 70–99)
Potassium: 3.5 mmol/L (ref 3.5–5.1)
Sodium: 139 mmol/L (ref 135–145)
Total Bilirubin: 1.4 mg/dL — ABNORMAL HIGH (ref 0.3–1.2)
Total Protein: 7.3 g/dL (ref 6.5–8.1)

## 2018-08-14 LAB — MRSA PCR SCREENING: MRSA by PCR: POSITIVE — AB

## 2018-08-14 LAB — STREP PNEUMONIAE URINARY ANTIGEN: Strep Pneumo Urinary Antigen: NEGATIVE

## 2018-08-14 LAB — TROPONIN I
Troponin I: 0.04 ng/mL (ref <0.03)
Troponin I: 0.04 ng/mL (ref <0.03)

## 2018-08-14 MED ORDER — CHLORHEXIDINE GLUCONATE CLOTH 2 % EX PADS
6.0000 | MEDICATED_PAD | Freq: Every day | CUTANEOUS | Status: AC
  Administered 2018-08-14 – 2018-08-18 (×5): 6 via TOPICAL

## 2018-08-14 MED ORDER — MUPIROCIN 2 % EX OINT
1.0000 "application " | TOPICAL_OINTMENT | Freq: Two times a day (BID) | CUTANEOUS | Status: AC
  Administered 2018-08-14 – 2018-08-18 (×10): 1 via NASAL
  Filled 2018-08-14: qty 22

## 2018-08-14 MED ORDER — HYDROMORPHONE HCL 1 MG/ML IJ SOLN
0.5000 mg | INTRAMUSCULAR | Status: DC | PRN
  Administered 2018-08-14 – 2018-08-21 (×9): 0.5 mg via INTRAVENOUS
  Filled 2018-08-14 (×9): qty 0.5

## 2018-08-14 MED ORDER — HYDROMORPHONE HCL 1 MG/ML IJ SOLN
1.0000 mg | Freq: Once | INTRAMUSCULAR | Status: AC
  Administered 2018-08-14: 06:00:00 1 mg via INTRAVENOUS
  Filled 2018-08-14: qty 1

## 2018-08-14 MED ORDER — PRAVASTATIN SODIUM 40 MG PO TABS
40.0000 mg | ORAL_TABLET | Freq: Every day | ORAL | Status: DC
  Administered 2018-08-14 – 2018-08-18 (×5): 40 mg via ORAL
  Filled 2018-08-14: qty 4
  Filled 2018-08-14 (×4): qty 1

## 2018-08-14 MED ORDER — ALLOPURINOL 100 MG PO TABS
100.0000 mg | ORAL_TABLET | Freq: Every day | ORAL | Status: DC
  Administered 2018-08-14 – 2018-08-19 (×6): 100 mg via ORAL
  Filled 2018-08-14 (×6): qty 1

## 2018-08-14 MED ORDER — HYDROXYCHLOROQUINE SULFATE 200 MG PO TABS
200.0000 mg | ORAL_TABLET | ORAL | Status: DC
  Administered 2018-08-14 – 2018-08-20 (×5): 200 mg via ORAL
  Filled 2018-08-14 (×5): qty 1

## 2018-08-14 MED ORDER — PANTOPRAZOLE SODIUM 20 MG PO TBEC: 20.0000 mg | DELAYED_RELEASE_TABLET | Freq: Two times a day (BID) | ORAL | Status: DC

## 2018-08-14 MED ORDER — PANTOPRAZOLE SODIUM 40 MG PO TBEC
40.0000 mg | DELAYED_RELEASE_TABLET | Freq: Every day | ORAL | Status: DC
  Administered 2018-08-14 – 2018-08-19 (×6): 40 mg via ORAL
  Filled 2018-08-14 (×7): qty 1

## 2018-08-14 MED ORDER — BISACODYL 10 MG RE SUPP
10.0000 mg | Freq: Two times a day (BID) | RECTAL | Status: DC
  Administered 2018-08-14 – 2018-08-20 (×14): 10 mg via RECTAL
  Filled 2018-08-14 (×14): qty 1

## 2018-08-14 MED ORDER — CARVEDILOL 3.125 MG PO TABS
6.2500 mg | ORAL_TABLET | Freq: Two times a day (BID) | ORAL | Status: DC
  Administered 2018-08-14 – 2018-08-19 (×11): 6.25 mg via ORAL
  Filled 2018-08-14 (×11): qty 2

## 2018-08-14 NOTE — Consult Note (Signed)
Reason for Consult: Bowel obstruction Referring Physician: Dr. Gaetana Michaelis is an 78 y.o. female.  HPI: Patient is a 78 year old black female with multiple medical problems who presents today [facility] with worsening shortness of breath and abdominal discomfort.  She was found to have community-acquired pneumonia as well as CT scan evidence of a small bowel obstruction.  It is difficult to get a history from the patient as she is hard of hearing and seems to have a difficult time understanding my questions.  She does not appear in pain.  I cannot determine when her last bowel movement or flatus was.  Past Medical History:  Diagnosis Date  . Anemia    Hgb of 9-10  . Anemia of chronic disease    Hgb of 9-10 chronically; 06/2010: H&H-10.7/33.5, MCV-81, normal iron studies in 2010   . Arteriosclerotic cardiovascular disease (ASCVD)    Remote PTCA by patient report; LBBB; associated cardiomyopathy, presumed ischemic with EF 40-45% previously, 20% in 06/2009; h/o clinical congestive heart failure; negative stress nuclear in 2009 with inferoseptal and apical scar  . Automatic implantable cardioverter-defibrillator in situ   . Breast cancer ([place])    breast  . Chronic bronchitis ([place])   . Chronic renal disease, stage 3, moderately decreased glomerular filtration rate (GFR) between 30-59 mL/min/1.73 square meter (HCC) 02/01/2016  . Congestive heart failure (CHF) ([place])   . Elevated cholesterol   . Elevated sed rate   . GERD (gastroesophageal reflux disease)   . GI bleed   . Gout   . HOH (hard of hearing)   . Hyperlipidemia   . Hypertension   . Rheumatoid arthritis ([place])   . UTI (urinary tract infection)     Past Surgical History:  Procedure Laterality Date  . BI-VENTRICULAR IMPLANTABLE CARDIOVERTER DEFIBRILLATOR N/A 07/28/2012   Procedure: BI-VENTRICULAR IMPLANTABLE CARDIOVERTER DEFIBRILLATOR  (CRT-D);  Surgeon: Evans Lance, MD;  Location: [facility] CATH LAB;  Service: Cardiovascular;   Laterality: N/A;  . BI-VENTRICULAR IMPLANTABLE CARDIOVERTER DEFIBRILLATOR  (CRT-D)  07/28/2012  . BREAST BIOPSY Bilateral   . CATARACT EXTRACTION W/ INTRAOCULAR LENS IMPLANT Left   . CATARACT EXTRACTION W/PHACO Right 09/15/2013   Procedure: CATARACT EXTRACTION PHACO AND INTRAOCULAR LENS PLACEMENT (IOC);  Surgeon: Elta Guadeloupe T. Gershon Crane, MD;  Location: [facility] ORS;  Service: Ophthalmology;  Laterality: Right;  CDE:  10.74  . COLONOSCOPY N/A 03/04/2015   Procedure: COLONOSCOPY;  Surgeon: Rogene Houston, MD;  Location: [facility] ENDO SUITE;  Service: Endoscopy;  Laterality: N/A;  10:50   . ESOPHAGOGASTRODUODENOSCOPY N/A 03/04/2015   Procedure: ESOPHAGOGASTRODUODENOSCOPY (EGD);  Surgeon: Rogene Houston, MD;  Location: [facility] ENDO SU[facility];  Service: Endoscopy;  Laterality: N/A;  . ESOPHAGOGASTRODUODENOSCOPY N/A 09/21/2016   Procedure: ESOPHAGOGASTRODUODENOSCOPY (EGD);  Surgeon: Rogene Houston, MD;  Location[facility] END[facility];  Service: Endoscopy;  Laterality: N/A;  . GIVENS CAPSULE STUDY N/A 09/22/2016   Procedure: GIVENS CAPSULE STUDY;  Surgeon: Rogene Houston, MD;  Location: [facility] END[facility];  Service: Endoscopy;  Laterality: N/A;  . MASTECTOMY Left 1998  . TENDON TRANSFER Right 08/15/2017   Procedure: TENDON TRANSFER RIGHT WRIST EXTENSORS AS NEEDED, ULNA RESECTION AND STABILIZATION;  Surgeon: Charlotte Crumb, MD;  Location: [facility];  Service: Orthopedics;  Laterality: Right;  . TOTAL KNEE ARTHROPLASTY Right    Dr.Harrison  . TUBAL LIGATION      Family History  Problem Relation Age of Onset  . Diabetes Father   . Pancreatic cancer Father   . Hypertension Brother  Social History:  reports that she quit smoking about 11 years ago. Her smoking use included cigarettes. She started smoking about 61 years ago. She has a 7.50 pack-year smoking history. Her smokeless tobacco use includes chew. She reports that she does not drink alcohol or use drugs.  Allergies:  Allergies  Allergen Reactions  . Macrobid [Nitrofurantoin  Macrocrystal] Hives and Itching    Medications: I have reviewed the patient's current medications.  Results for orders placed or performed during the hospital encounter of 08/13/18 (from the past 48 hour(s))  Urinalysis, Routine w reflex microscopic     Status: Abnormal   Collection Time: 08/13/18  6:49 PM  Result Value Ref Range   Color, Urine AMBER (A) YELLOW    Comment: BIOCHEMICALS MAY BE AFFECTED BY COLOR   APPearance CLOUDY (A) CLEAR   Specific Gravity, Urine 1.016 1.005 - 1.030   pH 5.0 5.0 - 8.0   Glucose, UA NEGATIVE NEGATIVE mg/dL   Hgb urine dipstick MODERATE (A) NEGATIVE   Bilirubin Urine NEGATIVE NEGATIVE   Ketones, ur NEGATIVE NEGATIVE mg/dL   Protein, ur 30 (A) NEGATIVE mg/dL   Nitrite NEGATIVE NEGATIVE   Leukocytes,Ua TRACE (A) NEGATIVE   RBC / HPF 0-5 0 - 5 RBC/hpf   WBC, UA 0-5 0 - 5 WBC/hpf   Bacteria, UA MANY (A) NONE SEEN   Squamous Epithelial / LPF 0-5 0 - 5   Hyaline Casts, UA PRESENT     Comment: Performed at [facility], [address], [place], [place] [address]  Comprehensive metabolic panel     Status: Abnormal   Collection Time: 08/13/18  7:26 PM  Result Value Ref Range   Sodium 134 (L) 135 - 145 mmol/L   Potassium 4.0 3.5 - 5.1 mmol/L   Chloride 97 (L) 98 - 111 mmol/L   CO2 22 22 - 32 mmol/L   Glucose, Bld 120 (H) 70 - 99 mg/dL   BUN 65 (H) 8 - 23 mg/dL   Creatinine, Ser 3.21 (H) 0.44 - 1.00 mg/dL   Calcium 8.8 (L) 8.9 - 10.3 mg/dL   Total Protein 8.4 (H) 6.5 - 8.1 g/dL   Albumin 2.8 (L) 3.5 - 5.0 g/dL   AST 21 15 - 41 U/L   ALT 17 0 - 44 U/L   Alkaline Phosphatase 129 (H) 38 - 126 U/L   Total Bilirubin 1.3 (H) 0.3 - 1.2 mg/dL   GFR calc non Af Amer 13 (L) >60 mL/min   GFR calc Af Amer 15 (L) >60 mL/min   Anion gap 15 5 - 15    Comment: Performed at [facility], [address], [place], [place] [address]  Lipase, blood     Status: None   Collection Time: 08/13/18  7:26 PM  Result Value Ref Range   Lipase 30 11 - 51 U/L    Comment:  Performed at [facility], [address] [place], [place] [address]  Brain natriuretic peptide     Status: Abnormal   Collection Time: 08/13/18  7:26 PM  Result Value Ref Range   B Natriuretic Peptide 404.0 (H) 0.0 - 100.0 pg/mL    Comment: Performed at [facility], [address], [place], [place] [address]  Troponin I - Once     Status: Abnormal   Collection Time: 08/13/18  7:26 PM  Result Value Ref Range   Troponin I 0.04 (HH) <0.03 ng/mL    Comment: CRITICAL RESULT CALLED TO, READ BACK BY AND VERIFIED WITH: NICHOLS,K ON  08/13/18 AT 2025 BY LOY,C Performed at [facility], [address], [place], [place] [address]   CBC with Differential     Status: Abnormal   Collection Time: 08/13/18  7:26 PM  Result Value Ref Range   WBC 21.9 (H) 4.0 - 10.5 K/uL    Comment: WHITE COUNT CONFIRMED ON SMEAR   RBC 4.10 3.87 - 5.11 MIL/uL   Hemoglobin 10.8 (L) 12.0 - 15.0 g/dL   HCT 34.0 (L) 36.0 - 46.0 %   MCV 82.9 80.0 - 100.0 fL   MCH 26.3 26.0 - 34.0 pg   MCHC 31.8 30.0 - 36.0 g/dL   RDW 19.6 (H) 11.5 - 15.5 %   Platelets 211 150 - 400 K/uL   nRBC 0.0 0.0 - 0.2 %   Neutrophils Relative % 80 %   Neutro Abs 17.6 (H) 1.7 - 7.7 K/uL   Lymphocytes Relative 7 %   Lymphs Abs 1.6 0.7 - 4.0 K/uL   Monocytes Relative 12 %   Monocytes Absolute 2.5 (H) 0.1 - 1.0 K/uL   Eosinophils Relative 0 %   Eosinophils Absolute 0.1 0.0 - 0.5 K/uL   Basophils Relative 0 %   Basophils Absolute 0.0 0.0 - 0.1 K/uL   Immature Granulocytes 1 %   Abs Immature Granulocytes 0.17 (H) 0.00 - 0.07 K/uL    Comment: Performed at [facility], [address] [place], [place] [address]  Protime-INR     Status: Abnormal   Collection Time: 08/13/18  7:26 PM  Result Value Ref Range   Prothrombin Time 18.7 (H) 11.4 - 15.2 seconds   INR 1.6 (H) 0.8 - 1.2    Comment: (NOTE) INR goal varies based on device and disease states. Performed at [facility], [address], [place], [place] [address]   MRSA PCR Screening      Status: Abnormal   Collection Time: 08/14/18 12:27 AM  Result Value Ref Range   MRSA by PCR POSITIVE (A) NEGATIVE    Comment:        The GeneXpert MRSA Assay (FDA approved for NASAL specimens only), is one component of a comprehensive MRSA colonization surveillance program. It is not intended to diagnose MRSA infection nor to guide or monitor treatment for MRSA infections. RESULT CALLED TO, READ BACK BY AND VERIFIED WITH: H TETRUALT,RN @0400  08/14/18 [facility] Performed at [facility], [address], [place], [place] [address]   Troponin I - Now Then Q6H     Status: Abnormal   Collection Time: 08/14/18  1:01 AM  Result Value Ref Range   Troponin I 0.04 (HH) <0.03 ng/mL    Comment: CRITICAL VALUE NOTED.  VALUE IS CONSISTENT WITH PREVIOUSLY REPORTED AND CALLED VALUE. Performed at [facility], [address], [place], [place] [address]   Troponin I - Now Then Q6H     Status: Abnormal   Collection Time: 08/14/18  6:48 AM  Result Value Ref Range   Troponin I 0.04 (HH) <0.03 ng/mL    Comment: CRITICAL VALUE NOTED.  VALUE IS CONSISTENT WITH PREVIOUSLY REPORTED AND CALLED VALUE. Performed at [facility], [address] [place],  [address]   CBC WITH DIFFERENTIAL     Status: Abnormal   Collection Time: 08/14/18  6:48 AM  Result Value Ref Range   WBC 17.7 (H) 4.0 - 10.5 K/uL    Comment: WHITE COUNT CONFIRMED ON SMEAR   RBC 3.74 (L) 3.87 - 5.11 MIL/uL   Hemoglobin 9.8 (L) 12.0 - 15.0 g/dL  HCT 31.2 (L) 36.0 - 46.0 %   MCV 83.4 80.0 - 100.0 fL   MCH 26.2 26.0 - 34.0 pg   MCHC 31.4 30.0 - 36.0 g/dL   RDW 19.6 (H) 11.5 - 15.5 %   Platelets 193 150 - 400 K/uL   nRBC 0.0 0.0 - 0.2 %   Neutrophils Relative % 81 %   Neutro Abs 14.5 (H) 1.7 - 7.7 K/uL   Lymphocytes Relative 7 %   Lymphs Abs 1.1 0.7 - 4.0 K/uL   Monocytes Relative 11 %   Monocytes Absolute 1.9 (H) 0.1 - 1.0 K/uL   Eosinophils Relative 0 %   Eosinophils Absolute 0.0 0.0 - 0.5 K/uL   Basophils Relative 0 %    Basophils Absolute 0.0 0.0 - 0.1 K/uL   WBC Morphology MILD LEFT SHIFT (1-5% METAS, OCC MYELO, OCC BANDS)    Immature Granulocytes 1 %   Abs Immature Granulocytes 0.09 (H) 0.00 - 0.07 K/uL    Comment: Performed at [facility], [address], [place], [place] [address]  Comprehensive metabolic panel     Status: Abnormal   Collection Time: 08/14/18  6:48 AM  Result Value Ref Range   Sodium 139 135 - 145 mmol/L   Potassium 3.5 3.5 - 5.1 mmol/L   Chloride 104 98 - 111 mmol/L   CO2 22 22 - 32 mmol/L   Glucose, Bld 107 (H) 70 - 99 mg/dL   BUN 63 (H) 8 - 23 mg/dL   Creatinine, Ser 2.19 (H) 0.44 - 1.00 mg/dL    Comment: DELTA CHECK NOTED   Calcium 8.4 (L) 8.9 - 10.3 mg/dL   Total Protein 7.3 6.5 - 8.1 g/dL   Albumin 2.5 (L) 3.5 - 5.0 g/dL   AST 34 15 - 41 U/L   ALT 18 0 - 44 U/L   Alkaline Phosphatase 130 (H) 38 - 126 U/L   Total Bilirubin 1.4 (H) 0.3 - 1.2 mg/dL   GFR calc non Af Amer 21 (L) >60 mL/min   GFR calc Af Amer 24 (L) >60 mL/min   Anion gap 13 5 - 15    Comment: Performed at [facility], [address] [place], [place] [address]    Ct Abdomen Pelvis Wo Contrast  Result Date: 08/13/2018 CLINICAL DATA:  Abdominal swelling poor appetite EXAM: CT ABDOMEN AND PELVIS WITHOUT CONTRAST TECHNIQUE: Multidetector CT imaging of the abdomen and pelvis was performed following the standard protocol without IV contrast. COMPARISON:  Report 04/25/2001 FINDINGS: Lower chest: Lung bases demonstrate no pleural effusion. Atelectasis or mild aspiration at the right base. Cardiomegaly with partially visualized intracardiac pacing leads. Coronary vascular calcification. Small moderate hiatal hernia. Hepatobiliary: Multiple calcified gallstones. Mildly lobulated liver margin. No focal hepatic abnormality or biliary dilatation. Pancreas: Unremarkable. No pancreatic ductal dilatation or surrounding inflammatory changes. Spleen: Normal in size without focal abnormality. Adrenals/Urinary Tract: Adrenal glands  are within normal limits. No hydronephrosis. Probable cyst in the mid to lower right kidney. The bladder is unremarkable. Stomach/Bowel: Fluid-filled stomach. Multiple loops of fluid-filled dilated small bowel measuring up to 3.4 cm. Distal small bowel is decompressed as is the colon. Poorly identified transition point due to lack of enteral contrast but suspected to be within the distal small bowel/pelvis. No intramural air. No definite bowel wall thickening. Vascular/Lymphatic: Extensive aortic atherosclerosis. No aneurysm. No significant adenopathy Reproductive: Uterus unremarkable. 3 x 2.4 cm hyperdense/partially calcified left pelvic sidewall mass. Other: No free air. Slightly dense fluid within the posterior pelvis. Musculoskeletal: Grade  1 anterolisthesis L4 on L5. Mild to moderate superior endplate deformity at L4, new since 2017 comparison radiographs. IMPRESSION: 1. Multiple loops of dilated fluid-filled small bowel, consistent with mechanical small bowel obstruction. Poorly identified transition point due to lack of contrast, suspect that transition point with in the distal ileum in the pelvis, given collapsed appearance of the terminal ileum and colon. No free air at this time. 2. Trace amount of slightly dense fluid within the pelvis, may reflect a small amount of hemorrhagic ascites. 3. Partially calcified/hyperdense 3 cm left pelvic sidewall mass, possibly a lymph node or a left adnexal mass. 4. Mild consolidation at the right lung base which may be secondary to atelectasis, mild pneumonia or aspiration. 5. Cardiomegaly 6. Gallstones.  Slightly lobulated liver margin, question cirrhosis Electronically Signed   By: Donavan Foil M.D.   On: 08/13/2018 21:25   Dg Chest Port 1 View  Result Date: 08/13/2018 CLINICAL DATA:  Abdominal pain EXAM: PORTABLE CHEST 1 VIEW COMPARISON:  12/13/2015 FINDINGS: Left AICD remains in place, unchanged. Cardiomegaly with vascular congestion. Suspect early interstitial  edema. Low volumes with bibasilar atelectasis and small effusions. No acute bony abnormality. IMPRESSION: Findings compatible with mild edema/CHF. Low volumes with bibasilar atelectasis and small effusions. Electronically Signed   By: Rolm Baptise M.D.   On: 08/13/2018 19:38   Dg Chest Port 1v Same Day  Result Date: 08/13/2018 CLINICAL DATA:  Is NG tube placement EXAM: PORTABLE CHEST 1 VIEW COMPARISON:  08/13/2018 FINDINGS: NG tube is in place with the tip in the proximal stomach. Left AICD is unchanged. Cardiomegaly, bibasilar atelectasis. No overt edema or visible effusions. No acute bony abnormality. IMPRESSION: NG tube tip in the proximal stomach. Cardiomegaly, bibasilar atelectasis. Electronically Signed   By: Rolm Baptise M.D.   On: 08/13/2018 22:31    ROS:  Pertinent items are noted in HPI.  Blood pressure (!) 142/69, pulse (!) 105, temperature 98.7 F (37.1 C), temperature source Oral, resp. rate 20, height 5\' 4"  (1.626 m), weight 64.8 kg, SpO2 98 %. Physical Exam: Pleasant black female no acute distress Head is normocephalic, atraumatic Lungs clear to auscultation with decreased breath sounds bilaterally.  No wheezing noted. Heart examination reveals a regular rate and rhythm without S3, S4. Abdomen is distended but soft.  No rigidity is noted.  Difficult to appreciate hepatosplenomegaly. Rectal examination deferred at this time  CT scan images personally reviewed  Assessment/Plan: Impression: Bowel obstruction.  Is difficult to certain with definitive evidence whether this is a discrete mechanical bowel obstruction or a partial bowel obstruction due to her pneumonia and comorbidities as there is a significant amount of air and stool in the colon.  She does not require acute surgical intervention at this time.  She is a high risk surgical candidate given her comorbidities and her active community-acquired pneumonia.  Will start Dulcolax suppositories.  Will follow with you.  Aviva Signs 08/14/2018, 8:27 AM

## 2018-08-14 NOTE — Progress Notes (Signed)
Patient arrives to room 312 at this time via bed from ICU. Patient instructed on use of callbell with stated understanding.  Suction connected to NG tube and purewick.  Patient has no requests at this time

## 2018-08-14 NOTE — Progress Notes (Signed)
RN paged C. Bodenheimer, NP to make him aware that CCMD called RN to report patient had a 5 beat run V tach.  VSS and patient is asymptomatic at this time. RN awaiting response from NP.  P.J. Linus Mako, RN

## 2018-08-14 NOTE — Progress Notes (Signed)
Patients medications and black bag that was holding medications sent to pharmacy

## 2018-08-14 NOTE — Progress Notes (Signed)
PROGRESS NOTE  [name]  ZCH:[id number]  DOB: 1941-01-29  DOA: 08/13/2018 PCP: [name], MD  Brief Admission Hx: 78 year old female with heart disease status post AICD ICD placement, history of breast cancer, GERD, chronic systolic heart failure who was admitted with small bowel obstruction, pneumonia, weakness and UTI.  MDM/Assessment & Plan:   1. Community-acquired pneumonia-patient is clinically improving with treatments continue doxycycline IV.  Continue bronchodilators.  Follow sputum cultures follow blood cultures. 2. AKI-patient is clinically dehydrated and is being treated with IV fluids and there is some improvement in renal function.  Monitor intake and output.  Careful with hydration given history of CHF. 3. Small bowel obstruction-patient reports that pain is much better controlled.  No bowel movement yet.  Appreciate general surgery consulted and following.  Patient has been started on Dulcolax. 4. Chronic combined systolic and diastolic CHF-patient is volume depleted at this time and is being gently hydrated.  She has been restarted on her home carvedilol. 5. Coronary artery disease status post AICD placement- she has been resumed on home cardiac medications.  Follow. 6. Dyslipidemia- resume statin therapy. 7. Anemia of chronic disease- following hemoglobin closely. 8. Possible UTI-patient does not seem to have symptoms however she is a very difficult historian.  Will monitor urine culture for now.  She did receive a dose of IV ceftriaxone. 9. Rheumatoid arthritis-patient takes Plaquenil Monday through Friday to keep symptoms under control.  DVT prophylaxis: Lovenox Code Status: Full Family Communication:  Disposition Plan: Inpatient  Consultants:  Surgery  Procedures:  N/A  Antimicrobials:  Doxycycline 4/22 -   Ceftriaxone 4/22  Subjective: Patient says that her abdominal pain is much better today.  She has not had a bowel movement at this time.   She is having no difficulty with breathing.  Objective: Vitals:   08/14/18 0400 08/14/18 0500 08/14/18 0600 08/14/18 0700  BP: 120/74 (!) 160/74 (!) 157/79 (!) 142/69  Pulse: 96 (!) 105 (!) 108 (!) 105  Resp: (!) 31 (!) 25 (!) 26 20  Temp: 98.7 F (37.1 C)     TempSrc: Oral     SpO2: 98% 99% 99% 98%  Weight:  64.8 kg    Height:        Intake/Output Summary (Last 24 hours) at 08/14/2018 1028 Last data filed at 08/14/2018 0511 Gross per 24 hour  Intake 800.51 ml  Output 375 ml  Net 425.51 ml   Filed Weights   08/13/18 2000 08/14/18 0017 08/14/18 0500  Weight: 66.7 kg 64.8 kg 64.8 kg     REVIEW OF SYSTEMS  As per history otherwise all reviewed and reported negative  Exam:  General exam: Elderly female, awake and alert in no apparent distress and cooperative. Respiratory system: Clear. No increased work of breathing. Cardiovascular system: Normal S1 & S2 heard. No JVD, murmurs, gallops, clicks or pedal edema. Gastrointestinal system: Abdomen is nondistended, soft and mild generalized tenderness but no guarding. Normal bowel sounds heard. Central nervous system: Alert and oriented. No focal neurological deficits. Extremities: no CCE.  Data Reviewed: Basic Metabolic Panel: Recent Labs  Lab 08/13/18 1926 08/14/18 0648  NA 134* 139  K 4.0 3.5  CL 97* 104  CO2 22 22  GLUCOSE 120* 107*  BUN 65* 63*  CREATININE 3.21* 2.19*  CALCIUM 8.8* 8.4*   Liver Function Tests: Recent Labs  Lab 08/13/18 1926 08/14/18 0648  AST 21 34  ALT 17 18  ALKPHOS 129* 130*  BILITOT 1.3* 1.4*  PROT 8.4* 7.3  ALBUMIN 2.8* 2.5*   Recent Labs  Lab 08/13/18 1926  LIPASE 30   No results for input(s): AMMONIA in the last 168 hours. CBC: Recent Labs  Lab 08/13/18 1926 08/14/18 0648  WBC 21.9* 17.7*  NEUTROABS 17.6* 14.5*  HGB 10.8* 9.8*  HCT 34.0* 31.2*  MCV 82.9 83.4  PLT 211 193   Cardiac Enzymes: Recent Labs  Lab 08/13/18 1926 08/14/18 0101 08/14/18 0648  TROPONINI  0.04* 0.04* 0.04*   CBG (last 3)  No results for input(s): GLUCAP in the last 72 hours. Recent Results (from the past 240 hour(s))  Culture, blood (routine x 2) Call MD if unable to obtain prior to antibiotics being given     Status: None (Preliminary result)   Collection Time: 08/13/18  9:52 PM  Result Value Ref Range Status   Specimen Description BLOOD RIGHT ANTECUBITAL  Final   Special Requests   Final    BOTTLES DRAWN AEROBIC AND ANAEROBIC Blood Culture adequate volume   Culture   Final    NO GROWTH < 12 HOURS Performed at [facility], [address] [place], [place] [address]    Report Status PENDING  Incomplete  Culture, blood (routine x 2) Call MD if unable to obtain prior to antibiotics being given     Status: None (Preliminary result)   Collection Time: 08/13/18  9:52 PM  Result Value Ref Range Status   Specimen Description BLOOD RIGHT HAND  Final   Special Requests   Final    BOTTLES DRAWN AEROBIC AND ANAEROBIC Blood Culture adequate volume   Culture   Final    NO GROWTH < 12 HOURS Performed at [facility], [address], [place], [place] [address]    Report Status PENDING  Incomplete  MRSA PCR Screening     Status: Abnormal   Collection Time: 08/14/18 12:27 AM  Result Value Ref Range Status   MRSA by PCR POSITIVE (A) NEGATIVE Final    Comment:        The GeneXpert MRSA Assay (FDA approved for NASAL specimens only), is one component of a comprehensive MRSA colonization surveillance program. It is not intended to diagnose MRSA infection nor to guide or monitor treatment for MRSA infections. RESULT CALLED TO, READ BACK BY AND VERIFIED WITH: H TETRUALT,RN @0400  08/14/18 [facility] Performed at [facility], [address] [place], [place] [address]      Studies: Ct Abdomen Pelvis Wo Contrast  Result Date: 08/13/2018 CLINICAL DATA:  Abdominal swelling poor appetite EXAM: CT ABDOMEN AND PELVIS WITHOUT CONTRAST TECHNIQUE: Multidetector CT imaging of the abdomen and  pelvis was performed following the standard protocol without IV contrast. COMPARISON:  Report 04/25/2001 FINDINGS: Lower chest: Lung bases demonstrate no pleural effusion. Atelectasis or mild aspiration at the right base. Cardiomegaly with partially visualized intracardiac pacing leads. Coronary vascular calcification. Small moderate hiatal hernia. Hepatobiliary: Multiple calcified gallstones. Mildly lobulated liver margin. No focal hepatic abnormality or biliary dilatation. Pancreas: Unremarkable. No pancreatic ductal dilatation or surrounding inflammatory changes. Spleen: Normal in size without focal abnormality. Adrenals/Urinary Tract: Adrenal glands are within normal limits. No hydronephrosis. Probable cyst in the mid to lower right kidney. The bladder is unremarkable. Stomach/Bowel: Fluid-filled stomach. Multiple loops of fluid-filled dilated small bowel measuring up to 3.4 cm. Distal small bowel is decompressed as is the colon. Poorly identified transition point due to lack of enteral contrast but suspected to be within the distal small bowel/pelvis. No intramural air. No definite bowel wall thickening. Vascular/Lymphatic: Extensive aortic atherosclerosis.  No aneurysm. No significant adenopathy Reproductive: Uterus unremarkable. 3 x 2.4 cm hyperdense/partially calcified left pelvic sidewall mass. Other: No free air. Slightly dense fluid within the posterior pelvis. Musculoskeletal: Grade 1 anterolisthesis L4 on L5. Mild to moderate superior endplate deformity at L4, new since 2017 comparison radiographs. IMPRESSION: 1. Multiple loops of dilated fluid-filled small bowel, consistent with mechanical small bowel obstruction. Poorly identified transition point due to lack of contrast, suspect that transition point with in the distal ileum in the pelvis, given collapsed appearance of the terminal ileum and colon. No free air at this time. 2. Trace amount of slightly dense fluid within the pelvis, may reflect a small  amount of hemorrhagic ascites. 3. Partially calcified/hyperdense 3 cm left pelvic sidewall mass, possibly a lymph node or a left adnexal mass. 4. Mild consolidation at the right lung base which may be secondary to atelectasis, mild pneumonia or aspiration. 5. Cardiomegaly 6. Gallstones.  Slightly lobulated liver margin, question cirrhosis Electronically Signed   By: Donavan Foil M.D.   On: 08/13/2018 21:25   Dg Chest Port 1 View  Result Date: 08/13/2018 CLINICAL DATA:  Abdominal pain EXAM: PORTABLE CHEST 1 VIEW COMPARISON:  12/13/2015 FINDINGS: Left AICD remains in place, unchanged. Cardiomegaly with vascular congestion. Suspect early interstitial edema. Low volumes with bibasilar atelectasis and small effusions. No acute bony abnormality. IMPRESSION: Findings compatible with mild edema/CHF. Low volumes with bibasilar atelectasis and small effusions. Electronically Signed   By: Rolm Baptise M.D.   On: 08/13/2018 19:38   Dg Chest Port 1v Same Day  Result Date: 08/13/2018 CLINICAL DATA:  Is NG tube placement EXAM: PORTABLE CHEST 1 VIEW COMPARISON:  08/13/2018 FINDINGS: NG tube is in place with the tip in the proximal stomach. Left AICD is unchanged. Cardiomegaly, bibasilar atelectasis. No overt edema or visible effusions. No acute bony abnormality. IMPRESSION: NG tube tip in the proximal stomach. Cardiomegaly, bibasilar atelectasis. Electronically Signed   By: Rolm Baptise M.D.   On: 08/13/2018 22:31   Scheduled Meds: . allopurinol  100 mg Oral Daily  . bisacodyl  10 mg Rectal BID  . carvedilol  6.25 mg Oral BID WC  . Chlorhexidine Gluconate Cloth  6 each Topical Q0600  . enoxaparin (LOVENOX) injection  30 mg Subcutaneous Q24H  . hydroxychloroquine  200 mg Oral Once per day on Mon Tue Wed Thu Fri  . mupirocin ointment  1 application Nasal BID  . pantoprazole  40 mg Oral Daily  . pravastatin  40 mg Oral q1800   Continuous Infusions: . sodium chloride 10 mL/hr at 08/13/18 2223  . sodium chloride  75 mL/hr at 08/14/18 0930  . doxycycline (VIBRAMYCIN) IV 100 mg (08/14/18 5631)    Principal Problem:   CAP (community acquired pneumonia) Active Problems:   Dyslipidemia   Anemia of chronic disease   CAD S/P remote PCI- no details   Chronic combined systolic and diastolic CHF (congestive heart failure) (HCC)   AKI (acute kidney injury) (HCC)   SBO (small bowel obstruction) (HCC)   Pressure injury of skin  Critical care time spent: 32 minutes   Irwin Brakeman, MD [facility] 08/14/2018, 10:28 AM    LOS: 1 day  How to contact the [facility] Attending or Consulting provider [place] or covering provider during after hours [place], for this patient?  1. Check the care team in [facility] and look for a) attending/consulting TRH provider listed and b) the [facility] team listed 2. Log into [email or web address] and use [place]'s  universal password to access. If you do not have the password, please contact the hospital operator. 3. Locate the [facility] provider you are looking for under [facility] and page to a number that you can be directly reached. 4. If you still have difficulty reaching the provider, please page the [facility] [address] (Director on Call) for the Hospitalists listed on amion for assistance.

## 2018-08-14 NOTE — TOC Initial Note (Signed)
Transition of Care [facility]) - Initial/Assessment Note    Patient Details  Name: [name] MRN: [id number] Date of Birth: 12/22/1940  Transition of Care [facility]) CM/SW Contact:    Shade Flood, LCSW Phone Number: 08/14/2018, 1:06 PM  Clinical Narrative:                  Pt is a 78 year old female admitted with pneumonia and SBO. Pt reportedly is hard of hearing and has difficulty providing history. Spoke with pt's nephew by phone to assess. Nephew states that pt is normally very independent at home. She does not use DME or have any in home health services at this time. Pt's nephew states that pt drives and has not had any difficulty getting to appointments or obtaining medications. Will follow and continue to assess for TOC needs as pt's stay progresses.   Expected Discharge Plan: [place] Barriers to Discharge: Continued Medical Work up   Patient Goals and CMS Choice        Expected Discharge Plan and Services Expected Discharge Plan: [place]       Living arrangements for the past 2 months: Single Family Home                                      Prior Living Arrangements/Services Living arrangements for the past 2 months: Single Family Home Lives with:: Spouse Patient language and need for interpreter reviewed:: Yes Do you feel safe going back to the place where you live?: Yes      Need for Family Participation in Patient Care: No (Comment) Care giver support system in place?: Yes (comment)   Criminal Activity/Legal Involvement Pertinent to Current Situation/Hospitalization: No - Comment as needed  Activities of Daily Living Home Assistive Devices/Equipment: None ADL Screening (condition at time of admission) Patient's cognitive ability adequate to safely complete daily activities?: Yes Is the patient deaf or have difficulty hearing?: Yes Does the patient have difficulty seeing, even when wearing glasses/contacts?:  No Does the patient have difficulty concentrating, remembering, or making decisions?: No Patient able to express need for assistance with ADLs?: Yes Does the patient have difficulty dressing or bathing?: No Independently performs ADLs?: Yes (appropriate for developmental age) Does the patient have difficulty walking or climbing stairs?: No Weakness of Legs: None Weakness of Arms/Hands: None  Permission Sought/Granted                  Emotional Assessment Appearance:: Appears stated age     Orientation: : Oriented to Self, Oriented to Place, Oriented to Situation, Oriented to  Time Alcohol / Substance Use: Not Applicable Psych Involvement: No (comment)  Admission diagnosis:  SBO (small bowel obstruction) ([place]) [K56.609] Encounter for imaging study to confirm nasogastric (NG) tube placement [Z01.89] AKI (acute kidney injury) ([place]) [N17.9] Community acquired pneumonia of right lung, unspecified part of lung [J18.9] Patient Active Problem List   Diagnosis Date Noted  . SBO (small bowel obstruction) ([place]) 08/14/2018  . Pressure injury of skin 08/14/2018  . CAP (community acquired pneumonia) 08/13/2018  . AKI (acute kidney injury) ([place]) 08/10/2017  . Hyperkalemia 08/10/2017  . Acute blood loss anemia 09/20/2016  . Ischemic cardiomyopathy 09/20/2016  . Chronic combined systolic and diastolic CHF (congestive heart failure) ([place]) 09/20/2016  . CKD (chronic kidney disease), stage IV ([place]) 09/20/2016  . History of breast cancer 08/02/2016  .  Primary osteoarthritis of both knees 08/02/2016  . Primary osteoarthritis of both feet 08/02/2016  . History of anemia 07/17/2016  . History of CHF (congestive heart failure) 06/04/2016  . History of chronic kidney disease 06/04/2016  . Symptomatic anemia 06/04/2016  . Elevated sedimentation rate 03/03/2016  . Idiopathic chronic gout of multiple sites without tophus 03/03/2016  . Total knee replacement status, right 03/03/2016  . High risk  medication use 03/03/2016  . Chronic kidney disease (CKD) stage G3b/A1, moderately decreased glomerular filtration rate (GFR) between 30-44 mL/min/1.73 square meter and albuminuria creatinine ratio less than 30 mg/g (HCC) 02/01/2016  . CAD S/P remote PCI- no details 09/02/2014  . Cardiomyopathy, ischemic-EF 30-35% March 2015 09/02/2014  . Diastolic dysfunction-grade 2 09/02/2014  . CHF exacerbation ([place]) 08/28/2014  . Acute on chronic combined systolic and diastolic congestive heart failure ([place]) 08/28/2014  . Iron deficiency anemia 08/20/2013  . Malnutrition of moderate degree ([place]) 07/21/2013  . PNA (pneumonia) 07/19/2013  . HCAP (healthcare-associated pneumonia) 07/17/2013  . Sepsis ([place]) 07/17/2013  . ARF (acute renal failure) ([place]) 07/17/2013  . Rheumatoid arthritis ([place]) 07/04/2013  . Hypokalemia 07/03/2013  . Chest pain 07/03/2013  . Biventricular ICD in place (MDT 2014) 11/06/2012  . Anemia of chronic disease   . Breast cancer ([place])   . Hypertension   . Dyslipidemia 05/24/2009  . Chronic systolic heart failure ([place]) 05/24/2009  . Primary osteoarthritis of both hands 08/11/2007   PCP:  [name], MD Pharmacy:   [id number] [place] [place] [address] Phone: [phone] Fax: [phone]     Social Determinants of Health (SDOH) Interventions    Readmission Risk Interventions Readmission Risk Prevention Plan 08/14/2018  Transportation Screening Complete  Some recent data might be hidden

## 2018-08-14 NOTE — Progress Notes (Signed)
Dr. Olevia Bowens paged to see if pain medication could be ordered for pt. Pt c/o sharp pain in abdomen. Waiting for orders/call back. Will continue to monitor pt

## 2018-08-15 DIAGNOSIS — J189 Pneumonia, unspecified organism: Secondary | ICD-10-CM

## 2018-08-15 LAB — CBC WITH DIFFERENTIAL/PLATELET
Abs Immature Granulocytes: 0.07 10*3/uL (ref 0.00–0.07)
Basophils Absolute: 0 10*3/uL (ref 0.0–0.1)
Basophils Relative: 0 %
Eosinophils Absolute: 0 10*3/uL (ref 0.0–0.5)
Eosinophils Relative: 0 %
HCT: 29.8 % — ABNORMAL LOW (ref 36.0–46.0)
Hemoglobin: 9.4 g/dL — ABNORMAL LOW (ref 12.0–15.0)
Immature Granulocytes: 1 %
Lymphocytes Relative: 10 %
Lymphs Abs: 1.2 10*3/uL (ref 0.7–4.0)
MCH: 26 pg (ref 26.0–34.0)
MCHC: 31.5 g/dL (ref 30.0–36.0)
MCV: 82.5 fL (ref 80.0–100.0)
Monocytes Absolute: 1.3 10*3/uL — ABNORMAL HIGH (ref 0.1–1.0)
Monocytes Relative: 11 %
Neutro Abs: 9.8 10*3/uL — ABNORMAL HIGH (ref 1.7–7.7)
Neutrophils Relative %: 78 %
Platelets: 193 10*3/uL (ref 150–400)
RBC: 3.61 MIL/uL — ABNORMAL LOW (ref 3.87–5.11)
RDW: 19.6 % — ABNORMAL HIGH (ref 11.5–15.5)
WBC: 12.3 10*3/uL — ABNORMAL HIGH (ref 4.0–10.5)
nRBC: 0.2 % (ref 0.0–0.2)

## 2018-08-15 LAB — MAGNESIUM: Magnesium: 3 mg/dL — ABNORMAL HIGH (ref 1.7–2.4)

## 2018-08-15 LAB — COMPREHENSIVE METABOLIC PANEL
ALT: 30 U/L (ref 0–44)
AST: 59 U/L — ABNORMAL HIGH (ref 15–41)
Albumin: 2.3 g/dL — ABNORMAL LOW (ref 3.5–5.0)
Alkaline Phosphatase: 133 U/L — ABNORMAL HIGH (ref 38–126)
Anion gap: 11 (ref 5–15)
BUN: 70 mg/dL — ABNORMAL HIGH (ref 8–23)
CO2: 21 mmol/L — ABNORMAL LOW (ref 22–32)
Calcium: 8 mg/dL — ABNORMAL LOW (ref 8.9–10.3)
Chloride: 107 mmol/L (ref 98–111)
Creatinine, Ser: 1.46 mg/dL — ABNORMAL HIGH (ref 0.44–1.00)
GFR calc Af Amer: 39 mL/min — ABNORMAL LOW (ref >60)
GFR calc non Af Amer: 34 mL/min — ABNORMAL LOW (ref >60)
Glucose, Bld: 102 mg/dL — ABNORMAL HIGH (ref 70–99)
Potassium: 3.1 mmol/L — ABNORMAL LOW (ref 3.5–5.1)
Sodium: 139 mmol/L (ref 135–145)
Total Bilirubin: 1.6 mg/dL — ABNORMAL HIGH (ref 0.3–1.2)
Total Protein: 6.8 g/dL (ref 6.5–8.1)

## 2018-08-15 LAB — URINE CULTURE: Culture: NO GROWTH

## 2018-08-15 MED ORDER — POTASSIUM CHLORIDE CRYS ER 20 MEQ PO TBCR
40.0000 meq | EXTENDED_RELEASE_TABLET | Freq: Once | ORAL | Status: AC
  Administered 2018-08-15: 08:00:00 40 meq via ORAL
  Filled 2018-08-15: qty 2

## 2018-08-15 MED ORDER — METOPROLOL TARTRATE 5 MG/5ML IV SOLN
5.0000 mg | Freq: Once | INTRAVENOUS | Status: AC
  Administered 2018-08-15: 5 mg via INTRAVENOUS
  Filled 2018-08-15: qty 5

## 2018-08-15 MED ORDER — POTASSIUM CHLORIDE CRYS ER 20 MEQ PO TBCR
40.0000 meq | EXTENDED_RELEASE_TABLET | Freq: Once | ORAL | Status: DC
  Filled 2018-08-15: qty 2

## 2018-08-15 NOTE — Progress Notes (Signed)
RN paged Dr. Olevia Bowens to make him aware patient has had a 23 beat run of V tach, VSS, patient asymptomatic, awaiting response.  Patient is back in ventricular paced rhythm.  P.J. Linus Mako, RN

## 2018-08-15 NOTE — Progress Notes (Signed)
PROGRESS NOTE [name]  EGB:[id number]  DOB: Sep 06, 1940  DOA: 08/13/2018 PCP: [name], MD  Brief Admission Hx: 78 year old female with significant heart disease status post AICD placement, history of breast cancer, GERD, chronic systolic heart failure was admitted with a small bowel obstruction, pneumonia, weakness and UTI.  MDM/Assessment & Plan:   1. Community-acquired pneumonia-patient is clinically improving and we have started to de-escalate antibiotics.  Continue doxycycline as she is improving on this.  Continue bronchodilators.  Continue supportive therapy.  Follow blood cultures and sputum cultures. 2. AKI- the patient is improving with IV hydration.  I believe she was clinically dehydrated on admission and has improved with this therapy.  Her renal function is slowly improving.  We are being very careful with hydration given her history of CHF. 3. Small bowel obstruction-spoke with her general surgeon Dr. Arnoldo Morale who wants to continue supportive management of this.  The NG tube has had very minimal output.  She has had no bowel movement.  He wants to continue Dulcolax suppositories at this time. 4. Chronic combined systolic and diastolic CHF-patient was not was initially volume depleted and has been gently hydrated.  We have restarted her home carvedilol therapy.  She seems compensated at this time and no exacerbation. 5. Coronary artery disease status post AICD placement she has been resumed on her home cardiac medications and has been stable. 6. Abnormal urinalysis-possible UTI as she is a very poor historian and does not have any specific symptoms of UTI we have just monitored her cultures..  She did receive a dose of IV ceftriaxone on admission. 7. Rheumatoid arthritis-she takes Plaquenil Monday through Friday to keep her symptoms under control.  DVT prophylaxis: Lovenox Code Status: Full Family Communication: I spoke with her husband by telephone who verbalized  understanding Disposition Plan: Inpatient   Consultants:  Surgery Dr. Arnoldo Morale  Procedures:  N/A  Antimicrobials:  Doxycycline 4/22-  Ceftriaxone 4/22-4/23  Subjective: The patient is a very poor historian but reports that her abdominal pain is much better today.  She has not had a bowel movement.  She is not vomiting.  She is not having any difficulty breathing.  She denies shortness of breath.  She denies chest pain.  Objective: Vitals:   08/14/18 1832 08/14/18 2049 08/15/18 0336 08/15/18 0517  BP: (!) 142/76 118/69 127/61 122/63  Pulse: (!) 103 94 (!) 103 99  Resp: (!) 22     Temp: 97.9 F (36.6 C) 99.1 F (37.3 C) 98 F (36.7 C) 97.6 F (36.4 C)  TempSrc: Oral Oral Oral Oral  SpO2: 99% 94% 94% 93%  Weight:      Height:        Intake/Output Summary (Last 24 hours) at 08/15/2018 1340 Last data filed at 08/15/2018 0900 Gross per 24 hour  Intake 1840.09 ml  Output 1550 ml  Net 290.09 ml   Filed Weights   08/13/18 2000 08/14/18 0017 08/14/18 0500  Weight: 66.7 kg 64.8 kg 64.8 kg     REVIEW OF SYSTEMS  As per history otherwise all reviewed and reported negative  Exam:  General exam: Elderly female, awake, alert in no apparent distress.  Oriented x3. Respiratory system: Clear. No increased work of breathing. Cardiovascular system: Normal S1 & S2 heard.  Gastrointestinal system: Abdomen is nondistended, soft and nontender. Normal bowel sounds heard. Central nervous system: Alert and oriented. No focal neurological deficits. Extremities: no CCE.  Data Reviewed: Basic Metabolic Panel: Recent Labs  Lab 08/13/18 1926 08/14/18 3710  08/15/18 0407  NA 134* 139 139  K 4.0 3.5 3.1*  CL 97* 104 107  CO2 22 22 21*  GLUCOSE 120* 107* 102*  BUN 65* 63* 70*  CREATININE 3.21* 2.19* 1.46*  CALCIUM 8.8* 8.4* 8.0*  MG  --   --  3.0*   Liver Function Tests: Recent Labs  Lab 08/13/18 1926 08/14/18 0648 08/15/18 0407  AST 21 34 59*  ALT 17 18 30   ALKPHOS  129* 130* 133*  BILITOT 1.3* 1.4* 1.6*  PROT 8.4* 7.3 6.8  ALBUMIN 2.8* 2.5* 2.3*   Recent Labs  Lab 08/13/18 1926  LIPASE 30   No results for input(s): AMMONIA in the last 168 hours. CBC: Recent Labs  Lab 08/13/18 1926 08/14/18 0648 08/15/18 0407  WBC 21.9* 17.7* 12.3*  NEUTROABS 17.6* 14.5* 9.8*  HGB 10.8* 9.8* 9.4*  HCT 34.0* 31.2* 29.8*  MCV 82.9 83.4 82.5  PLT 211 193 193   Cardiac Enzymes: Recent Labs  Lab 08/13/18 1926 08/14/18 0101 08/14/18 0648  TROPONINI 0.04* 0.04* 0.04*   CBG (last 3)  No results for input(s): GLUCAP in the last 72 hours. Recent Results (from the past 240 hour(s))  Urine culture     Status: None   Collection Time: 08/13/18  6:49 PM  Result Value Ref Range Status   Specimen Description   Final    URINE, CLEAN CATCH Performed at [facility], [address] [place] [address] [place], [place] [address]    Special Requests   Final    NONE Performed at [facility], [address] [place], [place] [address]    Culture   Final    NO GROWTH Performed at [facility], [place] [address] [place], [place] [address]    Report Status 08/15/2018 FINAL  Final  Culture, blood (routine x 2) Call MD if unable to obtain prior to antibiotics being given     Status: None (Preliminary result)   Collection Time: 08/13/18  9:52 PM  Result Value Ref Range Status   Specimen Description BLOOD RIGHT ANTECUBITAL  Final   Special Requests   Final    BOTTLES DRAWN AEROBIC AND ANAEROBIC Blood Culture adequate volume   Culture   Final    NO GROWTH 2 DAYS Performed at [facility], [address], [place], [place] [address]    Report Status PENDING  Incomplete  Culture, blood (routine x 2) Call MD if unable to obtain prior to antibiotics being given     Status: None (Preliminary result)   Collection Time: 08/13/18  9:52 PM  Result Value Ref Range Status   Specimen Description BLOOD RIGHT HAND  Final   Special Requests   Final    BOTTLES DRAWN AEROBIC AND ANAEROBIC Blood  Culture adequate volume   Culture   Final    NO GROWTH 2 DAYS Performed at [facility], [address] [place], [place] [address]    Report Status PENDING  Incomplete  MRSA PCR Screening     Status: Abnormal   Collection Time: 08/14/18 12:27 AM  Result Value Ref Range Status   MRSA by PCR POSITIVE (A) NEGATIVE Final    Comment:        The GeneXpert MRSA Assay (FDA approved for NASAL specimens only), is one component of a comprehensive MRSA colonization surveillance program. It is not intended to diagnose MRSA infection nor to guide or monitor treatment for MRSA infections. RESULT CALLED TO, READ BACK BY AND VERIFIED WITH: H TETRUALT,RN @0400  08/14/18 MKELLY  Performed at [facility], [address] [place], [place] [address]      Studies: Ct Abdomen Pelvis Wo Contrast  Result Date: 08/13/2018 CLINICAL DATA:  Abdominal swelling poor appetite EXAM: CT ABDOMEN AND PELVIS WITHOUT CONTRAST TECHNIQUE: Multidetector CT imaging of the abdomen and pelvis was performed following the standard protocol without IV contrast. COMPARISON:  Report 04/25/2001 FINDINGS: Lower chest: Lung bases demonstrate no pleural effusion. Atelectasis or mild aspiration at the right base. Cardiomegaly with partially visualized intracardiac pacing leads. Coronary vascular calcification. Small moderate hiatal hernia. Hepatobiliary: Multiple calcified gallstones. Mildly lobulated liver margin. No focal hepatic abnormality or biliary dilatation. Pancreas: Unremarkable. No pancreatic ductal dilatation or surrounding inflammatory changes. Spleen: Normal in size without focal abnormality. Adrenals/Urinary Tract: Adrenal glands are within normal limits. No hydronephrosis. Probable cyst in the mid to lower right kidney. The bladder is unremarkable. Stomach/Bowel: Fluid-filled stomach. Multiple loops of fluid-filled dilated small bowel measuring up to 3.4 cm. Distal small bowel is decompressed as is the colon. Poorly identified  transition point due to lack of enteral contrast but suspected to be within the distal small bowel/pelvis. No intramural air. No definite bowel wall thickening. Vascular/Lymphatic: Extensive aortic atherosclerosis. No aneurysm. No significant adenopathy Reproductive: Uterus unremarkable. 3 x 2.4 cm hyperdense/partially calcified left pelvic sidewall mass. Other: No free air. Slightly dense fluid within the posterior pelvis. Musculoskeletal: Grade 1 anterolisthesis L4 on L5. Mild to moderate superior endplate deformity at L4, new since 2017 comparison radiographs. IMPRESSION: 1. Multiple loops of dilated fluid-filled small bowel, consistent with mechanical small bowel obstruction. Poorly identified transition point due to lack of contrast, suspect that transition point with in the distal ileum in the pelvis, given collapsed appearance of the terminal ileum and colon. No free air at this time. 2. Trace amount of slightly dense fluid within the pelvis, may reflect a small amount of hemorrhagic ascites. 3. Partially calcified/hyperdense 3 cm left pelvic sidewall mass, possibly a lymph node or a left adnexal mass. 4. Mild consolidation at the right lung base which may be secondary to atelectasis, mild pneumonia or aspiration. 5. Cardiomegaly 6. Gallstones.  Slightly lobulated liver margin, question cirrhosis Electronically Signed   By: Donavan Foil M.D.   On: 08/13/2018 21:25   Dg Chest Port 1 View  Result Date: 08/13/2018 CLINICAL DATA:  Abdominal pain EXAM: PORTABLE CHEST 1 VIEW COMPARISON:  12/13/2015 FINDINGS: Left AICD remains in place, unchanged. Cardiomegaly with vascular congestion. Suspect early interstitial edema. Low volumes with bibasilar atelectasis and small effusions. No acute bony abnormality. IMPRESSION: Findings compatible with mild edema/CHF. Low volumes with bibasilar atelectasis and small effusions. Electronically Signed   By: Rolm Baptise M.D.   On: 08/13/2018 19:38   Dg Chest Port 1v Same  Day  Result Date: 08/13/2018 CLINICAL DATA:  Is NG tube placement EXAM: PORTABLE CHEST 1 VIEW COMPARISON:  08/13/2018 FINDINGS: NG tube is in place with the tip in the proximal stomach. Left AICD is unchanged. Cardiomegaly, bibasilar atelectasis. No overt edema or visible effusions. No acute bony abnormality. IMPRESSION: NG tube tip in the proximal stomach. Cardiomegaly, bibasilar atelectasis. Electronically Signed   By: Rolm Baptise M.D.   On: 08/13/2018 22:31   Scheduled Meds: . allopurinol  100 mg Oral Daily  . bisacodyl  10 mg Rectal BID  . carvedilol  6.25 mg Oral BID WC  . Chlorhexidine Gluconate Cloth  6 each Topical Q0600  . enoxaparin (LOVENOX) injection  30 mg Subcutaneous Q24H  . hydroxychloroquine  200 mg Oral  Once per day on Mon Tue Wed Thu Fri  . mupirocin ointment  1 application Nasal BID  . pantoprazole  40 mg Oral Daily  . pravastatin  40 mg Oral q1800   Continuous Infusions: . sodium chloride 10 mL/hr at 08/13/18 2223  . sodium chloride 50 mL/hr at 08/15/18 0730  . doxycycline (VIBRAMYCIN) IV 100 mg (08/15/18 0829)    Principal Problem:   CAP (community acquired pneumonia) Active Problems:   Dyslipidemia   Anemia of chronic disease   CAD S/P remote PCI- no details   Chronic combined systolic and diastolic CHF (congestive heart failure) (HCC)   AKI (acute kidney injury) (HCC)   SBO (small bowel obstruction) (HCC)   Pressure injury of skin  Time spent:   Irwin Brakeman, MD [facility] 08/15/2018, 1:40 PM    LOS: 2 days  How to contact the [facility] Attending or Consulting provider [place] or covering provider during after hours [place], for this patient?  1. Check the care team in [facility] and look for a) attending/consulting [facility] provider listed and b) the [facility] team listed 2. Log into [email or web address] and use [place]'s universal password to access. If you do not have the password, please contact the hospital operator. 3. Locate the [facility] provider you are looking  for under [facility] and page to a number that you can be directly reached. 4. If you still have difficulty reaching the provider, please page the [facility] (Director on Call) for the Hospitalists listed on amion for assistance.

## 2018-08-15 NOTE — Progress Notes (Signed)
Subjective: Patient resting comfortably.  No specific complaints noted.  Objective: Vital signs in last 24 hours: Temp:  [97.6 F (36.4 C)-99.1 F (37.3 C)] 97.6 F (36.4 C) (04/24 0517) Pulse Rate:  [91-106] 99 (04/24 0517) Resp:  [22-27] 22 (04/23 1832) BP: (118-159)/(61-80) 122/63 (04/24 0517) SpO2:  [93 %-100 %] 93 % (04/24 0517) Last BM Date: 08/11/18  Intake/Output from previous day: 04/23 0701 - 04/24 0700 In: 1840.1 [I.V.:1590.1; IV Piggyback:250] Out: 1450 [Urine:450; Emesis/NG output:1000] Intake/Output this shift: Total I/O In: 0  Out: 100 [Urine:100]  General appearance: cooperative and no distress GI: soft, non-tender; bowel sounds normal; no masses,  no organomegaly and Slightly distended.  No rigidity noted.  Lab Results:  Recent Labs    08/14/18 0648 08/15/18 0407  WBC 17.7* 12.3*  HGB 9.8* 9.4*  HCT 31.2* 29.8*  PLT 193 193   BMET Recent Labs    08/14/18 0648 08/15/18 0407  NA 139 139  K 3.5 3.1*  CL 104 107  CO2 22 21*  GLUCOSE 107* 102*  BUN 63* 70*  CREATININE 2.19* 1.46*  CALCIUM 8.4* 8.0*   PT/INR Recent Labs    08/13/18 1926  LABPROT 18.7*  INR 1.6*    Studies/Results: Ct Abdomen Pelvis Wo Contrast  Result Date: 08/13/2018 CLINICAL DATA:  Abdominal swelling poor appetite EXAM: CT ABDOMEN AND PELVIS WITHOUT CONTRAST TECHNIQUE: Multidetector CT imaging of the abdomen and pelvis was performed following the standard protocol without IV contrast. COMPARISON:  Report 04/25/2001 FINDINGS: Lower chest: Lung bases demonstrate no pleural effusion. Atelectasis or mild aspiration at the right base. Cardiomegaly with partially visualized intracardiac pacing leads. Coronary vascular calcification. Small moderate hiatal hernia. Hepatobiliary: Multiple calcified gallstones. Mildly lobulated liver margin. No focal hepatic abnormality or biliary dilatation. Pancreas: Unremarkable. No pancreatic ductal dilatation or surrounding inflammatory  changes. Spleen: Normal in size without focal abnormality. Adrenals/Urinary Tract: Adrenal glands are within normal limits. No hydronephrosis. Probable cyst in the mid to lower right kidney. The bladder is unremarkable. Stomach/Bowel: Fluid-filled stomach. Multiple loops of fluid-filled dilated small bowel measuring up to 3.4 cm. Distal small bowel is decompressed as is the colon. Poorly identified transition point due to lack of enteral contrast but suspected to be within the distal small bowel/pelvis. No intramural air. No definite bowel wall thickening. Vascular/Lymphatic: Extensive aortic atherosclerosis. No aneurysm. No significant adenopathy Reproductive: Uterus unremarkable. 3 x 2.4 cm hyperdense/partially calcified left pelvic sidewall mass. Other: No free air. Slightly dense fluid within the posterior pelvis. Musculoskeletal: Grade 1 anterolisthesis L4 on L5. Mild to moderate superior endplate deformity at L4, new since 2017 comparison radiographs. IMPRESSION: 1. Multiple loops of dilated fluid-filled small bowel, consistent with mechanical small bowel obstruction. Poorly identified transition point due to lack of contrast, suspect that transition point with in the distal ileum in the pelvis, given collapsed appearance of the terminal ileum and colon. No free air at this time. 2. Trace amount of slightly dense fluid within the pelvis, may reflect a small amount of hemorrhagic ascites. 3. Partially calcified/hyperdense 3 cm left pelvic sidewall mass, possibly a lymph node or a left adnexal mass. 4. Mild consolidation at the right lung base which may be secondary to atelectasis, mild pneumonia or aspiration. 5. Cardiomegaly 6. Gallstones.  Slightly lobulated liver margin, question cirrhosis Electronically Signed   By: Donavan Foil M.D.   On: 08/13/2018 21:25   Dg Chest Port 1 View  Result Date: 08/13/2018 CLINICAL DATA:  Abdominal pain EXAM: PORTABLE CHEST 1 VIEW COMPARISON:  12/13/2015 FINDINGS: Left  AICD remains in place, unchanged. Cardiomegaly with vascular congestion. Suspect early interstitial edema. Low volumes with bibasilar atelectasis and small effusions. No acute bony abnormality. IMPRESSION: Findings compatible with mild edema/CHF. Low volumes with bibasilar atelectasis and small effusions. Electronically Signed   By: Rolm Baptise M.D.   On: 08/13/2018 19:38   Dg Chest Port 1v Same Day  Result Date: 08/13/2018 CLINICAL DATA:  Is NG tube placement EXAM: PORTABLE CHEST 1 VIEW COMPARISON:  08/13/2018 FINDINGS: NG tube is in place with the tip in the proximal stomach. Left AICD is unchanged. Cardiomegaly, bibasilar atelectasis. No overt edema or visible effusions. No acute bony abnormality. IMPRESSION: NG tube tip in the proximal stomach. Cardiomegaly, bibasilar atelectasis. Electronically Signed   By: Rolm Baptise M.D.   On: 08/13/2018 22:31    Anti-infectives: Anti-infectives (From admission, onward)   Start     Dose/Rate Route Frequency Ordered Stop   08/14/18 1000  hydroxychloroquine (PLAQUENIL) tablet 200 mg    Note to Pharmacy:  Take 1 tablet by mouth daily Monday through Friday only     200 mg Oral Once per day on Mon Tue Wed Thu Fri 08/14/18 0742     08/13/18 2200  doxycycline (VIBRA-TABS) tablet 100 mg  Status:  Discontinued     100 mg Oral Every 12 hours 08/13/18 2134 08/13/18 2135   08/13/18 2200  doxycycline (VIBRAMYCIN) 100 mg in sodium chloride 0.9 % 250 mL IVPB     100 mg 125 mL/hr over 120 Minutes Intravenous Every 12 hours 08/13/18 2135 08/20/18 2159   08/13/18 2145  cefTRIAXone (ROCEPHIN) 1 g in sodium chloride 0.9 % 100 mL IVPB  Status:  Discontinued     1 g 200 mL/hr over 30 Minutes Intravenous Every 24 hours 08/13/18 2134 08/14/18 0743      Assessment/Plan: Impression: Partial bowel obstruction versus ileus.  Patient has multiple comorbidities including active pneumonia and renal failure that will cause a slowing of her bowels.  NG tube output has not been  significant.  No need for acute surgical invention at this time.  Continue Dulcolax suppositories.  Discussed with Dr. Wynetta Emery.  LOS: 2 days    Aviva Signs 08/15/2018

## 2018-08-16 DIAGNOSIS — E869 Volume depletion, unspecified: Secondary | ICD-10-CM

## 2018-08-16 DIAGNOSIS — E876 Hypokalemia: Secondary | ICD-10-CM

## 2018-08-16 LAB — CBC WITH DIFFERENTIAL/PLATELET
Abs Immature Granulocytes: 0.34 10*3/uL — ABNORMAL HIGH (ref 0.00–0.07)
Basophils Absolute: 0 10*3/uL (ref 0.0–0.1)
Basophils Relative: 0 %
Eosinophils Absolute: 0 10*3/uL (ref 0.0–0.5)
Eosinophils Relative: 0 %
HCT: 32 % — ABNORMAL LOW (ref 36.0–46.0)
Hemoglobin: 9.3 g/dL — ABNORMAL LOW (ref 12.0–15.0)
Immature Granulocytes: 2 %
Lymphocytes Relative: 11 %
Lymphs Abs: 1.5 10*3/uL (ref 0.7–4.0)
MCH: 24.9 pg — ABNORMAL LOW (ref 26.0–34.0)
MCHC: 29.1 g/dL — ABNORMAL LOW (ref 30.0–36.0)
MCV: 85.8 fL (ref 80.0–100.0)
Monocytes Absolute: 1.4 10*3/uL — ABNORMAL HIGH (ref 0.1–1.0)
Monocytes Relative: 10 %
Neutro Abs: 10.7 10*3/uL — ABNORMAL HIGH (ref 1.7–7.7)
Neutrophils Relative %: 77 %
Platelets: 233 10*3/uL (ref 150–400)
RBC: 3.73 MIL/uL — ABNORMAL LOW (ref 3.87–5.11)
RDW: 19.9 % — ABNORMAL HIGH (ref 11.5–15.5)
WBC: 14 10*3/uL — ABNORMAL HIGH (ref 4.0–10.5)
nRBC: 0.6 % — ABNORMAL HIGH (ref 0.0–0.2)

## 2018-08-16 LAB — MAGNESIUM: Magnesium: 3.3 mg/dL — ABNORMAL HIGH (ref 1.7–2.4)

## 2018-08-16 LAB — COMPREHENSIVE METABOLIC PANEL
ALT: 28 U/L (ref 0–44)
AST: 45 U/L — ABNORMAL HIGH (ref 15–41)
Albumin: 2.2 g/dL — ABNORMAL LOW (ref 3.5–5.0)
Alkaline Phosphatase: 133 U/L — ABNORMAL HIGH (ref 38–126)
Anion gap: 10 (ref 5–15)
BUN: 65 mg/dL — ABNORMAL HIGH (ref 8–23)
CO2: 21 mmol/L — ABNORMAL LOW (ref 22–32)
Calcium: 8.2 mg/dL — ABNORMAL LOW (ref 8.9–10.3)
Chloride: 112 mmol/L — ABNORMAL HIGH (ref 98–111)
Creatinine, Ser: 1.19 mg/dL — ABNORMAL HIGH (ref 0.44–1.00)
GFR calc Af Amer: 50 mL/min — ABNORMAL LOW (ref >60)
GFR calc non Af Amer: 43 mL/min — ABNORMAL LOW (ref >60)
Glucose, Bld: 88 mg/dL (ref 70–99)
Potassium: 3.3 mmol/L — ABNORMAL LOW (ref 3.5–5.1)
Sodium: 143 mmol/L (ref 135–145)
Total Bilirubin: 1.2 mg/dL (ref 0.3–1.2)
Total Protein: 6.6 g/dL (ref 6.5–8.1)

## 2018-08-16 MED ORDER — POLYETHYLENE GLYCOL 3350 17 G PO PACK
17.0000 g | PACK | Freq: Two times a day (BID) | ORAL | Status: DC
  Administered 2018-08-16 – 2018-08-19 (×7): 17 g via ORAL
  Filled 2018-08-16 (×7): qty 1

## 2018-08-16 MED ORDER — POTASSIUM CHLORIDE 10 MEQ/100ML IV SOLN
10.0000 meq | INTRAVENOUS | Status: AC
  Administered 2018-08-16 (×4): 10 meq via INTRAVENOUS
  Filled 2018-08-16 (×3): qty 100

## 2018-08-16 NOTE — Progress Notes (Signed)
  Subjective: Patient more animated this morning.  She states she does feel better.  Denies any abdominal pain.  Objective: Vital signs in last 24 hours: Temp:  [97.9 F (36.6 C)-98.1 F (36.7 C)] 98.1 F (36.7 C) (04/25 0535) Pulse Rate:  [87-99] 94 (04/25 0535) Resp:  [16-17] 16 (04/25 0535) BP: (122-135)/(55-61) 127/61 (04/25 0535) SpO2:  [93 %-100 %] 100 % (04/25 0535) Last BM Date: 08/11/18  Intake/Output from previous day: 04/24 0701 - 04/25 0700 In: 0  Out: 400 [Urine:400] Intake/Output this shift: No intake/output data recorded.  General appearance: alert, cooperative and no distress GI: Soft, moderately distended but unchanged from yesterday.  No tenderness or rigidity are noted.  Occasional bowel sounds appreciated.  Lab Results:  Recent Labs    08/15/18 0407 08/16/18 0635  WBC 12.3* 14.0*  HGB 9.4* 9.3*  HCT 29.8* 32.0*  PLT 193 233   BMET Recent Labs    08/15/18 0407 08/16/18 0635  NA 139 143  K 3.1* 3.3*  CL 107 112*  CO2 21* 21*  GLUCOSE 102* 88  BUN 70* 65*  CREATININE 1.46* 1.19*  CALCIUM 8.0* 8.2*   PT/INR Recent Labs    08/13/18 1926  LABPROT 18.7*  INR 1.6*    Studies/Results: No results found.  Anti-infectives: Anti-infectives (From admission, onward)   Start     Dose/Rate Route Frequency Ordered Stop   08/14/18 1000  hydroxychloroquine (PLAQUENIL) tablet 200 mg    Note to Pharmacy:  Take 1 tablet by mouth daily Monday through Friday only     200 mg Oral Once per day on Mon Tue Wed Thu Fri 08/14/18 0742     08/13/18 2200  doxycycline (VIBRA-TABS) tablet 100 mg  Status:  Discontinued     100 mg Oral Every 12 hours 08/13/18 2134 08/13/18 2135   08/13/18 2200  doxycycline (VIBRAMYCIN) 100 mg in sodium chloride 0.9 % 250 mL IVPB     100 mg 125 mL/hr over 120 Minutes Intravenous Every 12 hours 08/13/18 2135 08/20/18 2159   08/13/18 2145  cefTRIAXone (ROCEPHIN) 1 g in sodium chloride 0.9 % 100 mL IVPB  Status:  Discontinued     1  g 200 mL/hr over 30 Minutes Intravenous Every 24 hours 08/13/18 2134 08/14/18 0743      Assessment/Plan: Impression: Small bowel obstruction versus ileus.  Patient still has multiple comorbidities including pneumonia and hypokalemia.  Still difficult to a certain whether this is a mechanical obstruction.  No need for acute surgical intervention at this time.  Patient is a high risk for surgical intervention.  Would treat any electrolyte imbalance as able.  Will try MiraLAX.  LOS: 3 days    Aviva Signs 08/16/2018

## 2018-08-16 NOTE — Progress Notes (Signed)
PROGRESS NOTE [name]  TIR:[id number]  DOB: December 01, 1940  DOA: 08/13/2018 PCP: [name], MD  Brief Admission Hx: 78 year old female with significant heart disease status post AICD placement, history of breast cancer, GERD, chronic systolic heart failure was admitted with a small bowel obstruction, pneumonia, weakness and UTI.  08/16/2018: Patient seen.  No new complaints.  No bowel movement.  Patient has not broken wind.  Surgical input is appreciated.  No fever or chills.  No productive cough.  No chest pain or shortness of breath.  Conservative management continued for now.  MDM/Assessment & Plan:   Community-acquired pneumonia-patient is clinically improving and we have started to de-escalate antibiotics.  Continue doxycycline as she is improving on this.  Continue bronchodilators.  Continue supportive therapy.  Follow blood cultures and sputum cultures. 08/16/2018: No constitutional symptoms of pneumonia.  AKI- the patient is improving with IV hydration.  I believe she was clinically dehydrated on admission and has improved with this therapy.  Her renal function is slowly improving.  We are being very careful with hydration given her history of CHF. 08/16/2018: AKI is resolving.  Continue hydrating patient.  Hypokalemia: Continue to monitor and replete. IV KCl 40 M EQ every 4 hours. Monitor magnesium level as well.   Small bowel obstruction-spoke with her general surgeon Dr. Arnoldo Morale who wants to continue supportive management of this.  The NG tube has had very minimal output.  She has had no bowel movement.  He wants to continue Dulcolax suppositories at this time. 08/16/2018: Surgical team is directing care.  Chronic combined systolic and diastolic CHF-patient was not was initially volume depleted and has been gently hydrated.  We have restarted her home carvedilol therapy.  She seems compensated at this time and no exacerbation. 08/16/2018: Stable.  No symptoms.  Coronary  artery disease status post AICD placement she has been resumed on her home cardiac medications and has been stable. 08/16/2018: Stable.  No symptoms.  Abnormal urinalysis-possible UTI as she is a very poor historian and does not have any specific symptoms of UTI we have just monitored her cultures..  She did receive a dose of IV ceftriaxone on admission. 08/16/2018: First urine culture did not grow any organisms.  Will repeat urine cultures.  DVT prophylaxis: Lovenox Code Status: Full Family Communication: Disposition Plan: Inpatient   Consultants:  Surgery Dr. Arnoldo Morale  Procedures:  N/A  Antimicrobials:  Doxycycline 4/22-  Ceftriaxone 4/22-4/23  Subjective: No new complaints.  Poor historian.  Abdominal pain is intermittent. No bowel movement. Patient has not broken wind.  Objective: Vitals:   08/15/18 0517 08/15/18 1341 08/15/18 2143 08/16/18 0535  BP: 122/63 (!) 122/55 (!) 135/57 127/61  Pulse: 99 99 87 94  Resp:  17 16 16   Temp: 97.6 F (36.4 C) 97.9 F (36.6 C) 98.1 F (36.7 C) 98.1 F (36.7 C)  TempSrc: Oral  Oral Oral  SpO2: 93% 93% 95% 100%  Weight:      Height:        Intake/Output Summary (Last 24 hours) at 08/16/2018 1410 Last data filed at 08/16/2018 0900 Gross per 24 hour  Intake 0 ml  Output 300 ml  Net -300 ml   Filed Weights   08/13/18 2000 08/14/18 0017 08/14/18 0500  Weight: 66.7 kg 64.8 kg 64.8 kg     REVIEW OF SYSTEMS  As per history otherwise all reviewed and reported negative  Exam:  General exam: Elderly female, awake, alert in no apparent distress.  Oriented x3. Respiratory  system: Clear. No increased work of breathing. Cardiovascular system: Normal S1 & S2 heard.  Gastrointestinal system: Abdomen is distended, bowel sound is hyperactive.  Organs are difficult to assess.  Central nervous system: Alert and oriented. No focal neurological deficits. Extremities: no leg edema Data Reviewed: Basic Metabolic Panel: Recent Labs   Lab 08/13/18 1926 08/14/18 0648 08/15/18 0407 08/16/18 0635  NA 134* 139 139 143  K 4.0 3.5 3.1* 3.3*  CL 97* 104 107 112*  CO2 22 22 21* 21*  GLUCOSE 120* 107* 102* 88  BUN 65* 63* 70* 65*  CREATININE 3.21* 2.19* 1.46* 1.19*  CALCIUM 8.8* 8.4* 8.0* 8.2*  MG  --   --  3.0* 3.3*   Liver Function Tests: Recent Labs  Lab 08/13/18 1926 08/14/18 0648 08/15/18 0407 08/16/18 0635  AST 21 34 59* 45*  ALT 17 18 30 28   ALKPHOS 129* 130* 133* 133*  BILITOT 1.3* 1.4* 1.6* 1.2  PROT 8.4* 7.3 6.8 6.6  ALBUMIN 2.8* 2.5* 2.3* 2.2*   Recent Labs  Lab 08/13/18 1926  LIPASE 30   No results for input(s): AMMONIA in the last 168 hours. CBC: Recent Labs  Lab 08/13/18 1926 08/14/18 0648 08/15/18 0407 08/16/18 0635  WBC 21.9* 17.7* 12.3* 14.0*  NEUTROABS 17.6* 14.5* 9.8* 10.7*  HGB 10.8* 9.8* 9.4* 9.3*  HCT 34.0* 31.2* 29.8* 32.0*  MCV 82.9 83.4 82.5 85.8  PLT 211 193 193 233   Cardiac Enzymes: Recent Labs  Lab 08/13/18 1926 08/14/18 0101 08/14/18 0648  TROPONINI 0.04* 0.04* 0.04*   CBG (last 3)  No results for input(s): GLUCAP in the last 72 hours. Recent Results (from the past 240 hour(s))  Urine culture     Status: None   Collection Time: 08/13/18  6:49 PM  Result Value Ref Range Status   Specimen Description   Final    URINE, CLEAN CATCH Performed at [facility], [address], [place], [place] [address]    Special Requests   Final    NONE Performed at [facility], [address], [place], [place] [address]    Culture   Final    NO GROWTH Performed at [facility], [place] [address] [place], [place] [address]    Report Status 08/15/2018 FINAL  Final  Culture, blood (routine x 2) Call MD if unable to obtain prior to antibiotics being given     Status: None (Preliminary result)   Collection Time: 08/13/18  9:52 PM  Result Value Ref Range Status   Specimen Description BLOOD RIGHT ANTECUBITAL  Final   Special Requests   Final    BOTTLES DRAWN AEROBIC AND  ANAEROBIC Blood Culture adequate volume   Culture   Final    NO GROWTH 3 DAYS Performed at [facility], [address] [place], [place] [address]    Report Status PENDING  Incomplete  Culture, blood (routine x 2) Call MD if unable to obtain prior to antibiotics being given     Status: None (Preliminary result)   Collection Time: 08/13/18  9:52 PM  Result Value Ref Range Status   Specimen Description BLOOD RIGHT HAND  Final   Special Requests   Final    BOTTLES DRAWN AEROBIC AND ANAEROBIC Blood Culture adequate volume   Culture   Final    NO GROWTH 3 DAYS Performed at [facility], [address] [place], [place] [address]    Report Status PENDING  Incomplete  MRSA PCR Screening     Status:  Abnormal   Collection Time: 08/14/18 12:27 AM  Result Value Ref Range Status   MRSA by PCR POSITIVE (A) NEGATIVE Final    Comment:        The GeneXpert MRSA Assay (FDA approved for NASAL specimens only), is one component of a comprehensive MRSA colonization surveillance program. It is not intended to diagnose MRSA infection nor to guide or monitor treatment for MRSA infections. RESULT CALLED TO, READ BACK BY AND VERIFIED WITH: H TETRUALT,RN @0400  08/14/18 [facility] Performed at [facility], [address], [place], [place] [address]      Studies: No results found. Scheduled Meds: . allopurinol  100 mg Oral Daily  . bisacodyl  10 mg Rectal BID  . carvedilol  6.25 mg Oral BID WC  . Chlorhexidine Gluconate Cloth  6 each Topical Q0600  . enoxaparin (LOVENOX) injection  30 mg Subcutaneous Q24H  . hydroxychloroquine  200 mg Oral Once per day on Mon Tue Wed Thu Fri  . mupirocin ointment  1 application Nasal BID  . pantoprazole  40 mg Oral Daily  . polyethylene glycol  17 g Oral BID  . potassium chloride  40 mEq Oral Once  . pravastatin  40 mg Oral q1800   Continuous Infusions: . sodium chloride 50 mL/hr at 08/16/18 0944  . doxycycline (VIBRAMYCIN) IV 100 mg (08/16/18 0946)     Principal Problem:   CAP (community acquired pneumonia) Active Problems:   Dyslipidemia   Anemia of chronic disease   CAD S/P remote PCI- no details   Chronic combined systolic and diastolic CHF (congestive heart failure) (HCC)   AKI (acute kidney injury) (HCC)   SBO (small bowel obstruction) (HCC)   Pressure injury of skin  Time spent:   Bonnell Public, MD [facility] 08/16/2018, 2:10 PM    LOS: 3 days  How to contact the [facility] Attending or Consulting provider [place] or covering provider during after hours [place], for this patient?  1. Check the care team in [facility] and look for a) attending/consulting TRH provider listed and b) the [facility] team listed 2. Log into [email or web address] and use [place]'s universal password to access. If you do not have the password, please contact the hospital operator. 3. Locate the [facility] provider you are looking for under [facility] and page to a number that you can be directly reached. 4. If you still have difficulty reaching the provider, please page the [facility] (Director on Call) for the Hospitalists listed on amion for assistance.

## 2018-08-16 NOTE — Progress Notes (Signed)
Called [name], husband, with plan of care/update. Stable and resting. Verbalized understanding.

## 2018-08-17 ENCOUNTER — Inpatient Hospital Stay (HOSPITAL_COMMUNITY): Payer: Medicare Other

## 2018-08-17 ENCOUNTER — Encounter (HOSPITAL_COMMUNITY): Payer: Self-pay | Admitting: Radiology

## 2018-08-17 LAB — CBC WITH DIFFERENTIAL/PLATELET
Abs Immature Granulocytes: 0.91 10*3/uL — ABNORMAL HIGH (ref 0.00–0.07)
Basophils Absolute: 0.1 10*3/uL (ref 0.0–0.1)
Basophils Relative: 0 %
Eosinophils Absolute: 0 10*3/uL (ref 0.0–0.5)
Eosinophils Relative: 0 %
HCT: 32.7 % — ABNORMAL LOW (ref 36.0–46.0)
Hemoglobin: 10.1 g/dL — ABNORMAL LOW (ref 12.0–15.0)
Immature Granulocytes: 7 %
Lymphocytes Relative: 11 %
Lymphs Abs: 1.5 10*3/uL (ref 0.7–4.0)
MCH: 26.2 pg (ref 26.0–34.0)
MCHC: 30.9 g/dL (ref 30.0–36.0)
MCV: 84.9 fL (ref 80.0–100.0)
Monocytes Absolute: 1.2 10*3/uL — ABNORMAL HIGH (ref 0.1–1.0)
Monocytes Relative: 9 %
Neutro Abs: 9.8 10*3/uL — ABNORMAL HIGH (ref 1.7–7.7)
Neutrophils Relative %: 73 %
Platelets: 241 10*3/uL (ref 150–400)
RBC: 3.85 MIL/uL — ABNORMAL LOW (ref 3.87–5.11)
RDW: 20.5 % — ABNORMAL HIGH (ref 11.5–15.5)
WBC: 13.5 10*3/uL — ABNORMAL HIGH (ref 4.0–10.5)
nRBC: 1.8 % — ABNORMAL HIGH (ref 0.0–0.2)

## 2018-08-17 LAB — RENAL FUNCTION PANEL
Albumin: 2.2 g/dL — ABNORMAL LOW (ref 3.5–5.0)
Anion gap: 11 (ref 5–15)
BUN: 57 mg/dL — ABNORMAL HIGH (ref 8–23)
CO2: 20 mmol/L — ABNORMAL LOW (ref 22–32)
Calcium: 8.4 mg/dL — ABNORMAL LOW (ref 8.9–10.3)
Chloride: 113 mmol/L — ABNORMAL HIGH (ref 98–111)
Creatinine, Ser: 1.15 mg/dL — ABNORMAL HIGH (ref 0.44–1.00)
GFR calc Af Amer: 52 mL/min — ABNORMAL LOW (ref >60)
GFR calc non Af Amer: 45 mL/min — ABNORMAL LOW (ref >60)
Glucose, Bld: 89 mg/dL (ref 70–99)
Phosphorus: 3.6 mg/dL (ref 2.5–4.6)
Potassium: 3.6 mmol/L (ref 3.5–5.1)
Sodium: 144 mmol/L (ref 135–145)

## 2018-08-17 LAB — MAGNESIUM: Magnesium: 3 mg/dL — ABNORMAL HIGH (ref 1.7–2.4)

## 2018-08-17 MED ORDER — POTASSIUM CHLORIDE 10 MEQ/100ML IV SOLN
10.0000 meq | INTRAVENOUS | Status: AC
  Administered 2018-08-17 (×4): 10 meq via INTRAVENOUS
  Filled 2018-08-17 (×3): qty 100

## 2018-08-17 NOTE — Progress Notes (Signed)
PROGRESS NOTE AMERIKA NOURSE  OAC:[id number]  DOB: 08-31-1940  DOA: 08/13/2018 PCP: [name], MD  Brief Admission Hx: 78 year old female with significant heart disease status post AICD placement, history of breast cancer, GERD, chronic systolic heart failure was admitted with a small bowel obstruction, pneumonia, weakness and UTI.  08/16/2018: Patient seen.  No new complaints.  No bowel movement.  Patient has not broken wind.  Surgical input is appreciated.  No fever or chills.  No productive cough.  No chest pain or shortness of breath.  Conservative management continued for now.  08/17/2018: Repeat abdominal x-ray revealed high-grade mechanical obstruction.  Surgery team is directing care.  Otherwise, patient is not in any distress.  MDM/Assessment & Plan:   Community-acquired pneumonia-patient is clinically improving and we have started to de-escalate antibiotics.  Continue doxycycline as she is improving on this.  Continue bronchodilators.  Continue supportive therapy.  Follow blood cultures and sputum cultures. 08/17/2018: No constitutional symptoms of pneumonia.    AKI- Improving with IV hydration.  08/17/2018: AKI is resolving.  Continue hydrating patient.  Hypokalemia: Potassium is 3.6 today.   Continue to monitor and replete. IV KCl 40 M EQ every 4 hours. Monitor magnesium level as well.  Small bowel obstruction-spoke with her general surgeon Dr. Arnoldo Morale who wants to continue supportive management of this.  The NG tube has had very minimal output.  She has had no bowel movement.  He wants to continue Dulcolax suppositories at this time. 08/16/2018: Surgical team is directing care.  Chronic combined systolic and diastolic CHF-patient was not was initially volume depleted and has been gently hydrated.  We have restarted her home carvedilol therapy.  She seems compensated at this time and no exacerbation. 08/17/2018: Abdominal x-ray reveals high-grade bowel mechanical obstruction.   Surgical team is directing care.    Coronary artery disease status post AICD placement she has been resumed on her home cardiac medications and has been stable. 08/17/2018: Stable.  No symptoms.  Abnormal urinalysis-possible UTI as she is a very poor historian and does not have any specific symptoms of UTI we have just monitored her cultures..  She did receive a dose of IV ceftriaxone on admission. 08/17/2018: First urine culture did not grow any organisms.  Follow repeat urine cultures.  DVT prophylaxis: Lovenox Code Status: Full Family Communication: Disposition Plan: Inpatient   Consultants:  Surgery Dr. Arnoldo Morale  Procedures:  N/A  Antimicrobials:  Doxycycline 4/22-  Ceftriaxone 4/22-4/23  Subjective: No new complaints.  Poor historian.    No bowel movement.  Objective: Vitals:   08/15/18 2143 08/16/18 0535 08/16/18 2107 08/17/18 0501  BP: (!) 135/57 127/61 125/70 128/66  Pulse: 87 94 94 95  Resp: 16 16 16 16   Temp: 98.1 F (36.7 C) 98.1 F (36.7 C)  98 F (36.7 C)  TempSrc: Oral Oral  Oral  SpO2: 95% 100% 95% 96%  Weight:      Height:        Intake/Output Summary (Last 24 hours) at 08/17/2018 1205 Last data filed at 08/17/2018 0900 Gross per 24 hour  Intake 3066.92 ml  Output 400 ml  Net 2666.92 ml   Filed Weights   08/13/18 2000 08/14/18 0017 08/14/18 0500  Weight: 66.7 kg 64.8 kg 64.8 kg     REVIEW OF SYSTEMS  As per history otherwise all reviewed and reported negative  Exam:  General exam: Elderly female, awake, alert in no apparent distress.  Oriented x3. Respiratory system: Clear. No increased work of breathing.  Cardiovascular system: Normal S1 & S2 heard.  Gastrointestinal system: Abdomen is distended, bowel sound is hypoactive.  Organs are difficult to assess.  Central nervous system: Alert and oriented. No focal neurological deficits. Extremities: no leg edema Data Reviewed: Basic Metabolic Panel: Recent Labs  Lab 08/13/18 1926  08/14/18 0648 08/15/18 0407 08/16/18 0635 08/17/18 0805  NA 134* 139 139 143 144  K 4.0 3.5 3.1* 3.3* 3.6  CL 97* 104 107 112* 113*  CO2 22 22 21* 21* 20*  GLUCOSE 120* 107* 102* 88 89  BUN 65* 63* 70* 65* 57*  CREATININE 3.21* 2.19* 1.46* 1.19* 1.15*  CALCIUM 8.8* 8.4* 8.0* 8.2* 8.4*  MG  --   --  3.0* 3.3* 3.0*  PHOS  --   --   --   --  3.6   Liver Function Tests: Recent Labs  Lab 08/13/18 1926 08/14/18 0648 08/15/18 0407 08/16/18 0635 08/17/18 0805  AST 21 34 59* 45*  --   ALT 17 18 30 28   --   ALKPHOS 129* 130* 133* 133*  --   BILITOT 1.3* 1.4* 1.6* 1.2  --   PROT 8.4* 7.3 6.8 6.6  --   ALBUMIN 2.8* 2.5* 2.3* 2.2* 2.2*   Recent Labs  Lab 08/13/18 1926  LIPASE 30   No results for input(s): AMMONIA in the last 168 hours. CBC: Recent Labs  Lab 08/13/18 1926 08/14/18 0648 08/15/18 0407 08/16/18 0635 08/17/18 0805  WBC 21.9* 17.7* 12.3* 14.0* 13.5*  NEUTROABS 17.6* 14.5* 9.8* 10.7* 9.8*  HGB 10.8* 9.8* 9.4* 9.3* 10.1*  HCT 34.0* 31.2* 29.8* 32.0* 32.7*  MCV 82.9 83.4 82.5 85.8 84.9  PLT 211 193 193 233 241   Cardiac Enzymes: Recent Labs  Lab 08/13/18 1926 08/14/18 0101 08/14/18 0648  TROPONINI 0.04* 0.04* 0.04*   CBG (last 3)  No results for input(s): GLUCAP in the last 72 hours. Recent Results (from the past 240 hour(s))  Urine culture     Status: None   Collection Time: 08/13/18  6:49 PM  Result Value Ref Range Status   Specimen Description   Final    URINE, CLEAN CATCH Performed at [facility], [address], [place], [place] [address]    Special Requests   Final    NONE Performed at [facility], [address] [place], [place] [address]    Culture   Final    NO GROWTH Performed at [facility], [place] [address], [place], [place] [address]    Report Status 08/15/2018 FINAL  Final  Culture, blood (routine x 2) Call MD if unable to obtain prior to antibiotics being given     Status: None (Preliminary result)   Collection Time:  08/13/18  9:52 PM  Result Value Ref Range Status   Specimen Description BLOOD RIGHT ANTECUBITAL  Final   Special Requests   Final    BOTTLES DRAWN AEROBIC AND ANAEROBIC Blood Culture adequate volume   Culture   Final    NO GROWTH 4 DAYS Performed at [facility], [address], [place], [place] [address]    Report Status PENDING  Incomplete  Culture, blood (routine x 2) Call MD if unable to obtain prior to antibiotics being given     Status: None (Preliminary result)   Collection Time: 08/13/18  9:52 PM  Result Value Ref Range Status   Specimen Description BLOOD RIGHT HAND  Final   Special Requests   Final    BOTTLES DRAWN AEROBIC AND ANAEROBIC  Blood Culture adequate volume   Culture   Final    NO GROWTH 4 DAYS Performed at [facility], [address] [place], [place] [address]    Report Status PENDING  Incomplete  MRSA PCR Screening     Status: Abnormal   Collection Time: 08/14/18 12:27 AM  Result Value Ref Range Status   MRSA by PCR POSITIVE (A) NEGATIVE Final    Comment:        The GeneXpert MRSA Assay (FDA approved for NASAL specimens only), is one component of a comprehensive MRSA colonization surveillance program. It is not intended to diagnose MRSA infection nor to guide or monitor treatment for MRSA infections. RESULT CALLED TO, READ BACK BY AND VERIFIED WITH: H TETRUALT,RN @0400  08/14/18 [facility] Performed at [facility], [address], [place], [place] [address]      Studies: No results found. Scheduled Meds: . allopurinol  100 mg Oral Daily  . bisacodyl  10 mg Rectal BID  . carvedilol  6.25 mg Oral BID WC  . Chlorhexidine Gluconate Cloth  6 each Topical Q0600  . enoxaparin (LOVENOX) injection  30 mg Subcutaneous Q24H  . hydroxychloroquine  200 mg Oral Once per day on Mon Tue Wed Thu Fri  . mupirocin ointment  1 application Nasal BID  . pantoprazole  40 mg Oral Daily  . polyethylene glycol  17 g Oral BID  . potassium chloride  40 mEq Oral Once  .  pravastatin  40 mg Oral q1800   Continuous Infusions: . sodium chloride 10 mL/hr at 08/17/18 1000  . doxycycline (VIBRAMYCIN) IV 100 mg (08/17/18 1002)    Principal Problem:   CAP (community acquired pneumonia) Active Problems:   Dyslipidemia   Anemia of chronic disease   CAD S/P remote PCI- no details   Chronic combined systolic and diastolic CHF (congestive heart failure) (HCC)   AKI (acute kidney injury) (HCC)   SBO (small bowel obstruction) (HCC)   Pressure injury of skin  Time spent:   Bonnell Public, MD [facility] 08/17/2018, 12:05 PM    LOS: 4 days  How to contact the [facility] Attending or Consulting provider [place] or covering provider during after hours [place], for this patient?  1. Check the care team in [facility] and look for a) attending/consulting TRH provider listed and b) the [facility] team listed 2. Log into [email or web address] and use 's universal password to access. If you do not have the password, please contact the hospital operator. 3. Locate the [facility] provider you are looking for under [facility] and page to a number that you can be directly reached. 4. If you still have difficulty reaching the provider, please page the [facility] (Director on Call) for the Hospitalists listed on amion for assistance.

## 2018-08-17 NOTE — Plan of Care (Signed)
Patient stomach distended. Green output through NG tube. No complaints of pain at this time.

## 2018-08-17 NOTE — Progress Notes (Signed)
  Subjective: Patient alert.  Denies any significant abdominal pain.  Is thirsty.  Objective: Vital signs in last 24 hours: Temp:  [98 F (36.7 C)] 98 F (36.7 C) (04/26 0501) Pulse Rate:  [94-95] 95 (04/26 0501) Resp:  [16] 16 (04/26 0501) BP: (125-128)/(66-70) 128/66 (04/26 0501) SpO2:  [95 %-96 %] 96 % (04/26 0501) Last BM Date: 08/11/18  Intake/Output from previous day: 04/25 0701 - 04/26 0700 In: 3066.9 [I.V.:1516.9; IV Piggyback:1550] Out: 1300 [Urine:400; Emesis/NG output:900] Intake/Output this shift: No intake/output data recorded.  General appearance: alert, cooperative and no distress GI: Soft but distended.  Minimal bowel sounds appreciated.  No rigidity is noted.  Lab Results:  Recent Labs    08/16/18 0635 08/17/18 0805  WBC 14.0* 13.5*  HGB 9.3* 10.1*  HCT 32.0* 32.7*  PLT 233 241   BMET Recent Labs    08/16/18 0635 08/17/18 0805  NA 143 144  K 3.3* 3.6  CL 112* 113*  CO2 21* 20*  GLUCOSE 88 89  BUN 65* 57*  CREATININE 1.19* 1.15*  CALCIUM 8.2* 8.4*   PT/INR No results for input(s): LABPROT, INR in the last 72 hours.  Studies/Results: No results found.  Anti-infectives: Anti-infectives (From admission, onward)   Start     Dose/Rate Route Frequency Ordered Stop   08/14/18 1000  hydroxychloroquine (PLAQUENIL) tablet 200 mg    Note to Pharmacy:  Take 1 tablet by mouth daily Monday through Friday only     200 mg Oral Once per day on Mon Tue Wed Thu Fri 08/14/18 0742     08/13/18 2200  doxycycline (VIBRA-TABS) tablet 100 mg  Status:  Discontinued     100 mg Oral Every 12 hours 08/13/18 2134 08/13/18 2135   08/13/18 2200  doxycycline (VIBRAMYCIN) 100 mg in sodium chloride 0.9 % 250 mL IVPB     100 mg 125 mL/hr over 120 Minutes Intravenous Every 12 hours 08/13/18 2135 08/20/18 2159   08/13/18 2145  cefTRIAXone (ROCEPHIN) 1 g in sodium chloride 0.9 % 100 mL IVPB  Status:  Discontinued     1 g 200 mL/hr over 30 Minutes Intravenous Every 24  hours 08/13/18 2134 08/14/18 0743      Assessment/Plan: Impression: Bowel obstruction versus ileus.  Continues to have significant NG tube output.  Azotemia slightly improved.  Potassium is within normal limits. Plan: No need for acute surgical intervention.  Will continue to monitor with you.  LOS: 4 days    Aviva Signs 08/17/2018

## 2018-08-18 DIAGNOSIS — N179 Acute kidney failure, unspecified: Secondary | ICD-10-CM

## 2018-08-18 DIAGNOSIS — N183 Chronic kidney disease, stage 3 (moderate): Secondary | ICD-10-CM

## 2018-08-18 LAB — CBC WITH DIFFERENTIAL/PLATELET
Abs Immature Granulocytes: 0.84 10*3/uL — ABNORMAL HIGH (ref 0.00–0.07)
Basophils Absolute: 0.1 10*3/uL (ref 0.0–0.1)
Basophils Relative: 0 %
Eosinophils Absolute: 0 10*3/uL (ref 0.0–0.5)
Eosinophils Relative: 0 %
HCT: 32.5 % — ABNORMAL LOW (ref 36.0–46.0)
Hemoglobin: 9.7 g/dL — ABNORMAL LOW (ref 12.0–15.0)
Immature Granulocytes: 6 %
Lymphocytes Relative: 13 %
Lymphs Abs: 1.9 10*3/uL (ref 0.7–4.0)
MCH: 25.3 pg — ABNORMAL LOW (ref 26.0–34.0)
MCHC: 29.8 g/dL — ABNORMAL LOW (ref 30.0–36.0)
MCV: 84.6 fL (ref 80.0–100.0)
Monocytes Absolute: 1.2 10*3/uL — ABNORMAL HIGH (ref 0.1–1.0)
Monocytes Relative: 8 %
Neutro Abs: 10.7 10*3/uL — ABNORMAL HIGH (ref 1.7–7.7)
Neutrophils Relative %: 73 %
Platelets: 246 10*3/uL (ref 150–400)
RBC: 3.84 MIL/uL — ABNORMAL LOW (ref 3.87–5.11)
RDW: 20.5 % — ABNORMAL HIGH (ref 11.5–15.5)
WBC Morphology: INCREASED
WBC: 14.8 10*3/uL — ABNORMAL HIGH (ref 4.0–10.5)
nRBC: 1.4 % — ABNORMAL HIGH (ref 0.0–0.2)

## 2018-08-18 LAB — RENAL FUNCTION PANEL
Albumin: 2.2 g/dL — ABNORMAL LOW (ref 3.5–5.0)
Anion gap: 10 (ref 5–15)
BUN: 63 mg/dL — ABNORMAL HIGH (ref 8–23)
CO2: 18 mmol/L — ABNORMAL LOW (ref 22–32)
Calcium: 8.5 mg/dL — ABNORMAL LOW (ref 8.9–10.3)
Chloride: 114 mmol/L — ABNORMAL HIGH (ref 98–111)
Creatinine, Ser: 1.16 mg/dL — ABNORMAL HIGH (ref 0.44–1.00)
GFR calc Af Amer: 52 mL/min — ABNORMAL LOW (ref >60)
GFR calc non Af Amer: 45 mL/min — ABNORMAL LOW (ref >60)
Glucose, Bld: 95 mg/dL (ref 70–99)
Phosphorus: 4.2 mg/dL (ref 2.5–4.6)
Potassium: 3.9 mmol/L (ref 3.5–5.1)
Sodium: 142 mmol/L (ref 135–145)

## 2018-08-18 LAB — CULTURE, BLOOD (ROUTINE X 2)
Culture: NO GROWTH
Culture: NO GROWTH
Special Requests: ADEQUATE
Special Requests: ADEQUATE

## 2018-08-18 MED ORDER — POTASSIUM CHLORIDE IN NACL 20-0.9 MEQ/L-% IV SOLN
INTRAVENOUS | Status: DC
  Administered 2018-08-18 – 2018-08-20 (×3): via INTRAVENOUS
  Filled 2018-08-18 (×2): qty 1000

## 2018-08-18 MED ORDER — MILK AND MOLASSES ENEMA
1.0000 | Freq: Once | RECTAL | Status: AC
  Administered 2018-08-18: 240 mL via RECTAL

## 2018-08-18 NOTE — Progress Notes (Signed)
Patient unable to retain molasses enema.

## 2018-08-18 NOTE — Progress Notes (Signed)
PROGRESS NOTE  [name] DPO:[id number] DOB: April 13, 1941 DOA: 08/13/2018 PCP: [name], MD  Brief History:  78 year old female with a history of CAD, AICD, systolic and diastolic CHF, CKD stage III, hyperlipidemia, hypertension, rheumatoid arthritis, and anemia of chronic disease presenting with 3-day history of abdominal pain and distention.  There was loose stool without any emesis.  There is no fevers, chills, chest pain, coughing, sore throat.  CT of the abdomen and pelvis showed multiple dilated loops of small bowel consistent with small bowel obstruction.  There is a suspected transition point in the distal ileum.  General surgery was consulted to assist with management.  There was concern of a consolidation in right lower lobe whether it was atelectasis versus pneumonia.  Patient was started on doxycycline.  Assessment/Plan: RLL opacity -Patient was treated clinically for pneumonia -Discontinue doxycycline after today's doses which would finish 6 days of antibiotics -Patient is afebrile hemodynamically stable saturating 97-100% on room air  Small bowel obstruction -Case discussed with general surgery, Dr. Arnoldo Morale -Patient is a high risk surgical candidate -Patient continues to have high NG output -[place] molasses enema trial today -continue NG decompression -no flatus or BM at this time -start judicious IVF  Acute on chronic renal failure--CKD stage 3 -baseline creatinine 1.0-1.3 -due to volume depletion -serum creatinine peaked 3.21 -resolved  Chronic systolic and diastolic CHF -4/31/5400 Echo EF 30-35%, diffuse HK, HK basal inferior myocardium, mild MR -Continue carvedilol  Rheumatoid arthritis -Continue Plaquenil  Hyperlipidemia -Continue statin  Hypokalemia -Replete  Stage 2 sacral decubitus -present at time of admission -local wound care    Disposition Plan:   Not stable for dc Family Communication:  No Family at bedside  Consultants:   General surgery  Code Status:  FULL   DVT Prophylaxis:   [place] Lovenox   Procedures: As Listed in Progress Note Above  Antibiotics Ceftriaxone 4/22 x 1 Doxy 4/22>>>    Subjective: Pt has not passed any flatus or had BM.  No vomiting.  Feel abd pain is a little better as well as abd distension.  No f/c, cp, sob,  Objective: Vitals:   08/17/18 1502 08/17/18 2134 08/18/18 0554 08/18/18 1502  BP: 137/78 119/61 137/60 (!) 102/48  Pulse: 93 100 (!) 104 96  Resp: 18 (!) 24 20 18   Temp: 98.2 F (36.8 C)  98.2 F (36.8 C)   TempSrc:   Oral   SpO2: 97% 96% 100% 96%  Weight:      Height:        Intake/Output Summary (Last 24 hours) at 08/18/2018 1544 Last data filed at 08/18/2018 1415 Gross per 24 hour  Intake 819.77 ml  Output 2251 ml  Net -1431.23 ml   Weight change:  Exam:   General:  Pt is alert, follows commands appropriately, not in acute distress  HEENT: No icterus, No thrush, No neck mass, [place]/AT  Cardiovascular: RRR, S1/S2, no rubs, no gallops  Respiratory: bibasilar crackles, no wheeze  Abdomen: Soft/+BS, non tender, mildly distended, no guarding  Extremities: 1+ LE edema, No lymphangitis, No petechiae, No rashes, no synovitis   Data Reviewed: I have personally reviewed following labs and imaging studies Basic Metabolic Panel: Recent Labs  Lab 08/14/18 0648 08/15/18 0407 08/16/18 0635 08/17/18 0805 08/18/18 0443  NA 139 139 143 144 142  K 3.5 3.1* 3.3* 3.6 3.9  CL 104 107 112* 113* 114*  CO2 22 21* 21* 20* 18*  GLUCOSE 107*  102* 88 89 95  BUN 63* 70* 65* 57* 63*  CREATININE 2.19* 1.46* 1.19* 1.15* 1.16*  CALCIUM 8.4* 8.0* 8.2* 8.4* 8.5*  MG  --  3.0* 3.3* 3.0*  --   PHOS  --   --   --  3.6 4.2   Liver Function Tests: Recent Labs  Lab 08/13/18 1926 08/14/18 0648 08/15/18 0407 08/16/18 0635 08/17/18 0805 08/18/18 0443  AST 21 34 59* 45*  --   --   ALT 17 18 30 28   --   --   ALKPHOS 129* 130* 133* 133*  --   --   BILITOT 1.3* 1.4* 1.6*  1.2  --   --   PROT 8.4* 7.3 6.8 6.6  --   --   ALBUMIN 2.8* 2.5* 2.3* 2.2* 2.2* 2.2*   Recent Labs  Lab 08/13/18 1926  LIPASE 30   No results for input(s): AMMONIA in the last 168 hours. Coagulation Profile: Recent Labs  Lab 08/13/18 1926  INR 1.6*   CBC: Recent Labs  Lab 08/14/18 0648 08/15/18 0407 08/16/18 0635 08/17/18 0805 08/18/18 0443  WBC 17.7* 12.3* 14.0* 13.5* 14.8*  NEUTROABS 14.5* 9.8* 10.7* 9.8* 10.7*  HGB 9.8* 9.4* 9.3* 10.1* 9.7*  HCT 31.2* 29.8* 32.0* 32.7* 32.5*  MCV 83.4 82.5 85.8 84.9 84.6  PLT 193 193 233 241 246   Cardiac Enzymes: Recent Labs  Lab 08/13/18 1926 08/14/18 0101 08/14/18 0648  TROPONINI 0.04* 0.04* 0.04*   BNP: Invalid input(s): POCBNP CBG: No results for input(s): GLUCAP in the last 168 hours. HbA1C: No results for input(s): HGBA1C in the last 72 hours. Urine analysis:    Component Value Date/Time   COLORURINE AMBER (A) 08/13/2018 1849   APPEARANCEUR CLOUDY (A) 08/13/2018 1849   LABSPEC 1.016 08/13/2018 1849   PHURINE 5.0 08/13/2018 1849   GLUCOSEU NEGATIVE 08/13/2018 1849   HGBUR MODERATE (A) 08/13/2018 1849   BILIRUBINUR NEGATIVE 08/13/2018 [place] 08/13/2018 1849   PROTEINUR 30 (A) 08/13/2018 1849   UROBILINOGEN 0.2 07/17/2013 2240   NITRITE NEGATIVE 08/13/2018 1849   LEUKOCYTESUR TRACE (A) 08/13/2018 1849   Sepsis Labs: @LABRCNTIP (procalcitonin:4,lacticidven:4) ) Recent Results (from the past 240 hour(s))  Urine culture     Status: None   Collection Time: 08/13/18  6:49 PM  Result Value Ref Range Status   Specimen Description   Final    URINE, CLEAN CATCH Performed at [facility], [address] [place], [place] [address]    Special Requests   Final    NONE Performed at [facility], [address], [place], [place] [address]    Culture   Final    NO GROWTH Performed at [facility], [place] [address], [place], [place] [address]    Report Status 08/15/2018 FINAL  Final    Culture, blood (routine x 2) Call MD if unable to obtain prior to antibiotics being given     Status: None   Collection Time: 08/13/18  9:52 PM  Result Value Ref Range Status   Specimen Description BLOOD LEFT ANTECUBITAL  Final   Special Requests   Final    BOTTLES DRAWN AEROBIC AND ANAEROBIC Blood Culture adequate volume   Culture   Final    NO GROWTH 5 DAYS Performed at [facility], [address] [place], [place] [address]    Report Status 08/18/2018 FINAL  Final  Culture, blood (routine x 2) Call MD if unable to obtain prior to antibiotics being given  Status: None   Collection Time: 08/13/18  9:52 PM  Result Value Ref Range Status   Specimen Description BLOOD RIGHT HAND  Final   Special Requests   Final    BOTTLES DRAWN AEROBIC AND ANAEROBIC Blood Culture adequate volume   Culture   Final    NO GROWTH 5 DAYS Performed at [facility], [address] [place], [place] [address]    Report Status 08/18/2018 FINAL  Final  MRSA PCR Screening     Status: Abnormal   Collection Time: 08/14/18 12:27 AM  Result Value Ref Range Status   MRSA by PCR POSITIVE (A) NEGATIVE Final    Comment:        The GeneXpert MRSA Assay (FDA approved for NASAL specimens only), is one component of a comprehensive MRSA colonization surveillance program. It is not intended to diagnose MRSA infection nor to guide or monitor treatment for MRSA infections. RESULT CALLED TO, READ BACK BY AND VERIFIED WITH: H TETRUALT,RN @0400  08/14/18 MKELLY Performed at [facility], [address], [place], [place] [address]      Scheduled Meds:  allopurinol  100 mg Oral Daily   bisacodyl  10 mg Rectal BID   carvedilol  6.25 mg Oral BID WC   enoxaparin (LOVENOX) injection  30 mg Subcutaneous Q24H   hydroxychloroquine  200 mg Oral Once per day on Mon Tue Wed Thu Fri   mupirocin ointment  1 application Nasal BID   pantoprazole  40 mg Oral Daily   polyethylene glycol  17 g Oral BID   potassium  chloride  40 mEq Oral Once   pravastatin  40 mg Oral q1800   Continuous Infusions:  sodium chloride 10 mL/hr at 08/17/18 1000   doxycycline (VIBRAMYCIN) IV 100 mg (08/18/18 0948)    Procedures/Studies: Ct Abdomen Pelvis Wo Contrast  Result Date: 08/13/2018 CLINICAL DATA:  Abdominal swelling poor appetite EXAM: CT ABDOMEN AND PELVIS WITHOUT CONTRAST TECHNIQUE: Multidetector CT imaging of the abdomen and pelvis was performed following the standard protocol without IV contrast. COMPARISON:  Report 04/25/2001 FINDINGS: Lower chest: Lung bases demonstrate no pleural effusion. Atelectasis or mild aspiration at the right base. Cardiomegaly with partially visualized intracardiac pacing leads. Coronary vascular calcification. Small moderate hiatal hernia. Hepatobiliary: Multiple calcified gallstones. Mildly lobulated liver margin. No focal hepatic abnormality or biliary dilatation. Pancreas: Unremarkable. No pancreatic ductal dilatation or surrounding inflammatory changes. Spleen: Normal in size without focal abnormality. Adrenals/Urinary Tract: Adrenal glands are within normal limits. No hydronephrosis. Probable cyst in the mid to lower right kidney. The bladder is unremarkable. Stomach/Bowel: Fluid-filled stomach. Multiple loops of fluid-filled dilated small bowel measuring up to 3.4 cm. Distal small bowel is decompressed as is the colon. Poorly identified transition point due to lack of enteral contrast but suspected to be within the distal small bowel/pelvis. No intramural air. No definite bowel wall thickening. Vascular/Lymphatic: Extensive aortic atherosclerosis. No aneurysm. No significant adenopathy Reproductive: Uterus unremarkable. 3 x 2.4 cm hyperdense/partially calcified left pelvic sidewall mass. Other: No free air. Slightly dense fluid within the posterior pelvis. Musculoskeletal: Grade 1 anterolisthesis L4 on L5. Mild to moderate superior endplate deformity at L4, new since 2017 comparison  radiographs. IMPRESSION: 1. Multiple loops of dilated fluid-filled small bowel, consistent with mechanical small bowel obstruction. Poorly identified transition point due to lack of contrast, suspect that transition point with in the distal ileum in the pelvis, given collapsed appearance of the terminal ileum and colon. No free air at this time. 2. Trace amount of slightly  dense fluid within the pelvis, may reflect a small amount of hemorrhagic ascites. 3. Partially calcified/hyperdense 3 cm left pelvic sidewall mass, possibly a lymph node or a left adnexal mass. 4. Mild consolidation at the right lung base which may be secondary to atelectasis, mild pneumonia or aspiration. 5. Cardiomegaly 6. Gallstones.  Slightly lobulated liver margin, question cirrhosis Electronically Signed   By: Donavan Foil M.D.   On: 08/13/2018 21:25   Dg Chest Port 1 View  Result Date: 08/13/2018 CLINICAL DATA:  Abdominal pain EXAM: PORTABLE CHEST 1 VIEW COMPARISON:  12/13/2015 FINDINGS: Left AICD remains in place, unchanged. Cardiomegaly with vascular congestion. Suspect early interstitial edema. Low volumes with bibasilar atelectasis and small effusions. No acute bony abnormality. IMPRESSION: Findings compatible with mild edema/CHF. Low volumes with bibasilar atelectasis and small effusions. Electronically Signed   By: Rolm Baptise M.D.   On: 08/13/2018 19:38   Dg Chest Port 1v Same Day  Result Date: 08/13/2018 CLINICAL DATA:  Is NG tube placement EXAM: PORTABLE CHEST 1 VIEW COMPARISON:  08/13/2018 FINDINGS: NG tube is in place with the tip in the proximal stomach. Left AICD is unchanged. Cardiomegaly, bibasilar atelectasis. No overt edema or visible effusions. No acute bony abnormality. IMPRESSION: NG tube tip in the proximal stomach. Cardiomegaly, bibasilar atelectasis. Electronically Signed   By: Rolm Baptise M.D.   On: 08/13/2018 22:31   Dg Abd 2 Views  Result Date: 08/17/2018 CLINICAL DATA:  Bowel obstruction EXAM:  ABDOMEN - 2 VIEW COMPARISON:  CT 08/13/2018 FINDINGS: Cardiac pacing device in the left lower chest. No free air on upright view. Worsened distension of small bowel loops, measuring up to 4.6 cm. Paucity of distal gas. IMPRESSION: Findings consistent with high-grade mechanical bowel obstruction with interval worsening of small bowel dilatation. No definite free air identified. Electronically Signed   By: Donavan Foil M.D.   On: 08/17/2018 15:52    Orson Eva, DO  [facility] Pager [phone]  If 7PM-7AM, please contact night-coverage [email or web address] Password [facility] 08/18/2018, 3:44 PM   LOS: 5 days

## 2018-08-18 NOTE — Care Management Important Message (Signed)
Important Message  Patient Details  Name: [name] MRN: [id number] Date of Birth: 1940/07/03   Medicare Important Message Given:  Yes    Tommy Medal 08/18/2018, 1:50 PM

## 2018-08-18 NOTE — Progress Notes (Signed)
  Subjective: Resting comfortably.  Objective: Vital signs in last 24 hours: Temp:  [98.2 F (36.8 C)] 98.2 F (36.8 C) (04/27 0554) Pulse Rate:  [93-104] 104 (04/27 0554) Resp:  [18-24] 20 (04/27 0554) BP: (119-137)/(60-78) 137/60 (04/27 0554) SpO2:  [96 %-100 %] 100 % (04/27 0554) Last BM Date: 08/11/18  Intake/Output from previous day: 04/26 0701 - 04/27 0700 In: 569.8 [P.O.:30; I.V.:39.8; IV Piggyback:500] Out: 2250 [Emesis/NG output:2250] Intake/Output this shift: No intake/output data recorded.  General appearance: cooperative and no distress GI: Soft, less distended.  Minimal bowel sounds appreciated.  No rigidity noted.  Lab Results:  Recent Labs    08/17/18 0805 08/18/18 0443  WBC 13.5* 14.8*  HGB 10.1* 9.7*  HCT 32.7* 32.5*  PLT 241 246   BMET Recent Labs    08/17/18 0805 08/18/18 0443  NA 144 142  K 3.6 3.9  CL 113* 114*  CO2 20* 18*  GLUCOSE 89 95  BUN 57* 63*  CREATININE 1.15* 1.16*  CALCIUM 8.4* 8.5*   PT/INR No results for input(s): LABPROT, INR in the last 72 hours.  Studies/Results: Dg Abd 2 Views  Result Date: 08/17/2018 CLINICAL DATA:  Bowel obstruction EXAM: ABDOMEN - 2 VIEW COMPARISON:  CT 08/13/2018 FINDINGS: Cardiac pacing device in the left lower chest. No free air on upright view. Worsened distension of small bowel loops, measuring up to 4.6 cm. Paucity of distal gas. IMPRESSION: Findings consistent with high-grade mechanical bowel obstruction with interval worsening of small bowel dilatation. No definite free air identified. Electronically Signed   By: Donavan Foil M.D.   On: 08/17/2018 15:52    Anti-infectives: Anti-infectives (From admission, onward)   Start     Dose/Rate Route Frequency Ordered Stop   08/14/18 1000  hydroxychloroquine (PLAQUENIL) tablet 200 mg    Note to Pharmacy:  Take 1 tablet by mouth daily Monday through Friday only     200 mg Oral Once per day on Mon Tue Wed Thu Fri 08/14/18 0742     08/13/18 2200   doxycycline (VIBRA-TABS) tablet 100 mg  Status:  Discontinued     100 mg Oral Every 12 hours 08/13/18 2134 08/13/18 2135   08/13/18 2200  doxycycline (VIBRAMYCIN) 100 mg in sodium chloride 0.9 % 250 mL IVPB     100 mg 125 mL/hr over 120 Minutes Intravenous Every 12 hours 08/13/18 2135 08/20/18 2159   08/13/18 2145  cefTRIAXone (ROCEPHIN) 1 g in sodium chloride 0.9 % 100 mL IVPB  Status:  Discontinued     1 g 200 mL/hr over 30 Minutes Intravenous Every 24 hours 08/13/18 2134 08/14/18 0743      Assessment/Plan: Impression: Patient continues to have significant NG tube output.  Again, difficult to a certain whether this is a mechanical obstruction versus an ileus.  Will continue NG tube decompression.  No need for acute surgical invention at this time.  Patient is a high risk surgical candidate.  Will try molasses enema.  LOS: 5 days    Aviva Signs 08/18/2018

## 2018-08-18 NOTE — Plan of Care (Signed)
Patient is complaining of abdominal pain. PRN medication administered. NG tube placed to intermittent LWS with green output. Will continue to monitor patient.

## 2018-08-19 ENCOUNTER — Inpatient Hospital Stay (HOSPITAL_COMMUNITY): Payer: Medicare Other

## 2018-08-19 DIAGNOSIS — I361 Nonrheumatic tricuspid (valve) insufficiency: Secondary | ICD-10-CM

## 2018-08-19 DIAGNOSIS — I340 Nonrheumatic mitral (valve) insufficiency: Secondary | ICD-10-CM

## 2018-08-19 LAB — RENAL FUNCTION PANEL
Albumin: 2.3 g/dL — ABNORMAL LOW (ref 3.5–5.0)
Anion gap: 13 (ref 5–15)
BUN: 68 mg/dL — ABNORMAL HIGH (ref 8–23)
CO2: 16 mmol/L — ABNORMAL LOW (ref 22–32)
Calcium: 8.5 mg/dL — ABNORMAL LOW (ref 8.9–10.3)
Chloride: 113 mmol/L — ABNORMAL HIGH (ref 98–111)
Creatinine, Ser: 1.29 mg/dL — ABNORMAL HIGH (ref 0.44–1.00)
GFR calc Af Amer: 46 mL/min — ABNORMAL LOW (ref >60)
GFR calc non Af Amer: 39 mL/min — ABNORMAL LOW (ref >60)
Glucose, Bld: 91 mg/dL (ref 70–99)
Phosphorus: 4.7 mg/dL — ABNORMAL HIGH (ref 2.5–4.6)
Potassium: 4.5 mmol/L (ref 3.5–5.1)
Sodium: 142 mmol/L (ref 135–145)

## 2018-08-19 LAB — CBC
HCT: 29.2 % — ABNORMAL LOW (ref 36.0–46.0)
Hemoglobin: 8.8 g/dL — ABNORMAL LOW (ref 12.0–15.0)
MCH: 25.9 pg — ABNORMAL LOW (ref 26.0–34.0)
MCHC: 30.1 g/dL (ref 30.0–36.0)
MCV: 85.9 fL (ref 80.0–100.0)
Platelets: 275 10*3/uL (ref 150–400)
RBC: 3.4 MIL/uL — ABNORMAL LOW (ref 3.87–5.11)
RDW: 21.2 % — ABNORMAL HIGH (ref 11.5–15.5)
WBC: 17.5 10*3/uL — ABNORMAL HIGH (ref 4.0–10.5)
nRBC: 0.6 % — ABNORMAL HIGH (ref 0.0–0.2)

## 2018-08-19 LAB — ECHOCARDIOGRAM COMPLETE
Height: 64 in
Weight: 2285.73 oz

## 2018-08-19 MED ORDER — METOPROLOL TARTRATE 5 MG/5ML IV SOLN
2.5000 mg | Freq: Four times a day (QID) | INTRAVENOUS | Status: DC
  Administered 2018-08-19 – 2018-08-21 (×7): 2.5 mg via INTRAVENOUS
  Filled 2018-08-19 (×7): qty 5

## 2018-08-19 MED ORDER — IOHEXOL 300 MG/ML  SOLN
75.0000 mL | Freq: Once | INTRAMUSCULAR | Status: AC | PRN
  Administered 2018-08-19: 15:00:00 75 mL via INTRAVENOUS

## 2018-08-19 MED ORDER — PANTOPRAZOLE SODIUM 40 MG IV SOLR
40.0000 mg | INTRAVENOUS | Status: DC
  Administered 2018-08-20: 40 mg via INTRAVENOUS
  Filled 2018-08-19: qty 40

## 2018-08-19 NOTE — Progress Notes (Signed)
  Subjective: No acute complaints.  Objective: Vital signs in last 24 hours: Temp:  [98.1 F (36.7 C)] 98.1 F (36.7 C) (04/28 0600) Pulse Rate:  [94-96] 94 (04/28 0600) Resp:  [16-18] 16 (04/28 0600) BP: (100-108)/(48-53) 100/53 (04/28 0600) SpO2:  [92 %-99 %] 96 % (04/28 0600) Last BM Date: 08/11/18  Intake/Output from previous day: 04/27 0701 - 04/28 0700 In: 1107.9 [I.V.:621.4; IV Piggyback:486.6] Out: 1701 [Urine:1051; Emesis/NG output:650] Intake/Output this shift: No intake/output data recorded.  General appearance: cooperative and no distress GI: Soft, nontender.  No significant distention noted.  No rigidity noted.  Bowel sounds minimal.  Lab Results:  Recent Labs    08/18/18 0443 08/19/18 0545  WBC 14.8* 17.5*  HGB 9.7* 8.8*  HCT 32.5* 29.2*  PLT 246 275   BMET Recent Labs    08/18/18 0443 08/19/18 0545  NA 142 142  K 3.9 4.5  CL 114* 113*  CO2 18* 16*  GLUCOSE 95 91  BUN 63* 68*  CREATININE 1.16* 1.29*  CALCIUM 8.5* 8.5*   PT/INR No results for input(s): LABPROT, INR in the last 72 hours.  Studies/Results: Dg Abd 2 Views  Result Date: 08/17/2018 CLINICAL DATA:  Bowel obstruction EXAM: ABDOMEN - 2 VIEW COMPARISON:  CT 08/13/2018 FINDINGS: Cardiac pacing device in the left lower chest. No free air on upright view. Worsened distension of small bowel loops, measuring up to 4.6 cm. Paucity of distal gas. IMPRESSION: Findings consistent with high-grade mechanical bowel obstruction with interval worsening of small bowel dilatation. No definite free air identified. Electronically Signed   By: Donavan Foil M.D.   On: 08/17/2018 15:52    Anti-infectives: Anti-infectives (From admission, onward)   Start     Dose/Rate Route Frequency Ordered Stop   08/14/18 1000  hydroxychloroquine (PLAQUENIL) tablet 200 mg    Note to Pharmacy:  Take 1 tablet by mouth daily Monday through Friday only     200 mg Oral Once per day on Mon Tue Wed Thu Fri 08/14/18 0742     08/13/18 2200  doxycycline (VIBRA-TABS) tablet 100 mg  Status:  Discontinued     100 mg Oral Every 12 hours 08/13/18 2134 08/13/18 2135   08/13/18 2200  doxycycline (VIBRAMYCIN) 100 mg in sodium chloride 0.9 % 250 mL IVPB     100 mg 125 mL/hr over 120 Minutes Intravenous Every 12 hours 08/13/18 2135 08/20/18 2159   08/13/18 2145  cefTRIAXone (ROCEPHIN) 1 g in sodium chloride 0.9 % 100 mL IVPB  Status:  Discontinued     1 g 200 mL/hr over 30 Minutes Intravenous Every 24 hours 08/13/18 2134 08/14/18 0743      Assessment/Plan: Impression: Bowel obstruction, question mechanical versus ileus.  NG tube output decreasing.  Patient did not retain molasses enema yesterday.  Will get CT scan of abdomen with contrast via NG tube to reassess.  Further management pending those results.  LOS: 6 days    Aviva Signs 08/19/2018

## 2018-08-19 NOTE — Progress Notes (Signed)
PROGRESS NOTE  [name] YKD:[id number] DOB: October 22, 1940 DOA: 08/13/2018 PCP: [name], MD  Brief History:  78 year old female with a history of CAD, AICD, systolic and diastolic CHF, CKD stage III, hyperlipidemia, hypertension, rheumatoid arthritis, and anemia of chronic disease presenting with 3-day history of abdominal pain and distention.  There was loose stool without any emesis.  There is no fevers, chills, chest pain, coughing, sore throat.  CT of the abdomen and pelvis showed multiple dilated loops of small bowel consistent with small bowel obstruction.  There is a suspected transition point in the distal ileum.  General surgery was consulted to assist with management.  There was concern of a consolidation in right lower lobe whether it was atelectasis versus pneumonia.  Patient was started on doxycycline initially.  Doxycycline was discontinued after one week of therapy.  NG was placed initially with some clinical improvement; however, on 08/18/18, the patient had increased NG output--2200 cc.  As a result, repeat CT of abdomen and pelvis was ordered on 08/19/18 which showed persistent high grade SBO with transition in right hemipelvis.  Due to patient's comorbid conditions, general surgery recommended transfer to [facility] for operative intervention.  Assessment/Plan: RLL opacity -Patient was treated clinically for pneumonia -Finished 7 days doxy -Patient is afebrile hemodynamically stable saturating 97-100% on room air  Small bowel obstruction -Patient is a high risk surgical candidate -Patient continues to have high NG output (900cc-->2.2L-->650cc on last 3 days) -continue NG decompression -has flatus but no BM -start judicious IVF -4/28 CT abd pelvis--high grade SBO with transition point in R-pelvis -Case discussed with general surgery, Dr. Arnoldo Morale -due to patient's cardiomyopathy and comorbid illness, pt best served to transfer to [name] -08/19/18--cased  discussed with general surgery, Dr. Jule Ser call general surgery after patient arrives -need cardiology consult for clearance prior to surgery  Acute on chronic renal failure--CKD stage 3 -baseline creatinine 1.0-1.3 -due to volume depletion -serum creatinine peaked 3.21 -resolved  Chronic systolic and diastolic CHF -3/97/6734 Echo EF 30-35%, diffuse HK, HK basal inferior myocardium, mild MR -change carvedilol to IV lopressor until able to tolerate po -08/19/18 Echo--EF 30%, inferoseptal and inferbasal AK, small pericardial effusion, mild TR/MR  Rheumatoid arthritis -Holding Plaquenil until able to tolerate po  Hyperlipidemia -restart statin when able to tolerate po  Hypokalemia -Replete  Stage 2 sacral decubitus -present at time of admission -local wound care    Disposition Plan:   Transfer to [name] Family Communication:  Nephew updated on phone  Consultants:  General surgery--CCS  Code Status:  FULL   DVT Prophylaxis:   [place] Lovenox   Procedures: As Listed in Progress Note Above  Antibiotics Ceftriaxone 4/22 x 1 Doxy 4/22>>>4/28    Subjective: Patient denies fevers, chills, headache, chest pain, dyspnea, nausea, vomiting, diarrhea,dysuria, hematuria, hematochezia, and melena.  She feels that her abd is a bit smaller.  Passing flatus but no BM   Objective: Vitals:   08/18/18 2004 08/18/18 2121 08/19/18 0600 08/19/18 1551  BP:  (!) 108/50 (!) 100/53 (!) 125/56  Pulse:  95 94 89  Resp:  16 16 16   Temp:   98.1 F (36.7 C)   TempSrc:   Oral   SpO2: 92% 99% 96% 96%  Weight:      Height:        Intake/Output Summary (Last 24 hours) at 08/19/2018 1625 Last data filed at 08/19/2018 1600 Gross per 24 hour  Intake 1657.[place]  ml  Output 2300 ml  Net -642.09 ml   Weight change:  Exam:   General:  Pt is alert, follows commands appropriately, not in acute distress  HEENT: No icterus, No thrush, No neck mass, [place]/AT  Cardiovascular:  RRR, S1/S2, no rubs, no gallops  Respiratory: fine bibasilar crackles, no wheeze  Abdomen: Soft/+BS, non tender, non distended, no guarding  Extremities: No edema, No lymphangitis, No petechiae, No rashes, no synovitis   Data Reviewed: I have personally reviewed following labs and imaging studies Basic Metabolic Panel: Recent Labs  Lab 08/15/18 0407 08/16/18 0635 08/17/18 0805 08/18/18 0443 08/19/18 0545  NA 139 143 144 142 142  K 3.1* 3.3* 3.6 3.9 4.5  CL 107 112* 113* 114* 113*  CO2 21* 21* 20* 18* 16*  GLUCOSE 102* 88 89 95 91  BUN 70* 65* 57* 63* 68*  CREATININE 1.46* 1.19* 1.15* 1.16* 1.29*  CALCIUM 8.0* 8.2* 8.4* 8.5* 8.5*  MG 3.0* 3.3* 3.0*  --   --   PHOS  --   --  3.6 4.2 4.7*   Liver Function Tests: Recent Labs  Lab 08/13/18 1926 08/14/18 0648 08/15/18 0407 08/16/18 0635 08/17/18 0805 08/18/18 0443 08/19/18 0545  AST 21 34 59* 45*  --   --   --   ALT 17 18 30 28   --   --   --   ALKPHOS 129* 130* 133* 133*  --   --   --   BILITOT 1.3* 1.4* 1.6* 1.2  --   --   --   PROT 8.4* 7.3 6.8 6.6  --   --   --   ALBUMIN 2.8* 2.5* 2.3* 2.2* 2.2* 2.2* 2.3*   Recent Labs  Lab 08/13/18 1926  LIPASE 30   No results for input(s): AMMONIA in the last 168 hours. Coagulation Profile: Recent Labs  Lab 08/13/18 1926  INR 1.6*   CBC: Recent Labs  Lab 08/14/18 0648 08/15/18 0407 08/16/18 0635 08/17/18 0805 08/18/18 0443 08/19/18 0545  WBC 17.7* 12.3* 14.0* 13.5* 14.8* 17.5*  NEUTROABS 14.5* 9.8* 10.7* 9.8* 10.7*  --   HGB 9.8* 9.4* 9.3* 10.1* 9.7* 8.8*  HCT 31.2* 29.8* 32.0* 32.7* 32.5* 29.2*  MCV 83.4 82.5 85.8 84.9 84.6 85.9  PLT 193 193 233 241 246 275   Cardiac Enzymes: Recent Labs  Lab 08/13/18 1926 08/14/18 0101 08/14/18 0648  TROPONINI 0.04* 0.04* 0.04*   BNP: Invalid input(s): POCBNP CBG: No results for input(s): GLUCAP in the last 168 hours. HbA1C: No results for input(s): HGBA1C in the last 72 hours. Urine analysis:    Component  Value Date/Time   COLORURINE AMBER (A) 08/13/2018 1849   APPEARANCEUR CLOUDY (A) 08/13/2018 1849   LABSPEC 1.016 08/13/2018 1849   PHURINE 5.0 08/13/2018 1849   GLUCOSEU NEGATIVE 08/13/2018 1849   HGBUR MODERATE (A) 08/13/2018 1849   BILIRUBINUR NEGATIVE 08/13/2018 [place] 08/13/2018 1849   PROTEINUR 30 (A) 08/13/2018 1849   UROBILINOGEN 0.2 07/17/2013 2240   NITRITE NEGATIVE 08/13/2018 1849   LEUKOCYTESUR TRACE (A) 08/13/2018 1849   Sepsis Labs: @LABRCNTIP (procalcitonin:4,lacticidven:4) ) Recent Results (from the past 240 hour(s))  Urine culture     Status: None   Collection Time: 08/13/18  6:49 PM  Result Value Ref Range Status   Specimen Description   Final    URINE, CLEAN CATCH Performed at [facility], [address], [place], [place] [address]    Special Requests   Final    NONE Performed  at [facility], [address], [place], [place] [address]    Culture   Final    NO GROWTH Performed at [facility], [place] [address], [place], [place] [address]    Report Status 08/15/2018 FINAL  Final  Culture, blood (routine x 2) Call MD if unable to obtain prior to antibiotics being given     Status: None   Collection Time: 08/13/18  9:52 PM  Result Value Ref Range Status   Specimen Description BLOOD LEFT ANTECUBITAL  Final   Special Requests   Final    BOTTLES DRAWN AEROBIC AND ANAEROBIC Blood Culture adequate volume   Culture   Final    NO GROWTH 5 DAYS Performed at [facility], [address], [place], [place] [address]    Report Status 08/18/2018 FINAL  Final  Culture, blood (routine x 2) Call MD if unable to obtain prior to antibiotics being given     Status: None   Collection Time: 08/13/18  9:52 PM  Result Value Ref Range Status   Specimen Description BLOOD RIGHT HAND  Final   Special Requests   Final    BOTTLES DRAWN AEROBIC AND ANAEROBIC Blood Culture adequate volume   Culture   Final    NO GROWTH 5 DAYS Performed at [facility],  [address] [place], [place] [address]    Report Status 08/18/2018 FINAL  Final  MRSA PCR Screening     Status: Abnormal   Collection Time: 08/14/18 12:27 AM  Result Value Ref Range Status   MRSA by PCR POSITIVE (A) NEGATIVE Final    Comment:        The GeneXpert MRSA Assay (FDA approved for NASAL specimens only), is one component of a comprehensive MRSA colonization surveillance program. It is not intended to diagnose MRSA infection nor to guide or monitor treatment for MRSA infections. RESULT CALLED TO, READ BACK BY AND VERIFIED WITH: H TETRUALT,RN @0400  08/14/18 MKELLY Performed at [facility], [address] [place], [place] [address]      Scheduled Meds:  allopurinol  100 mg Oral Daily   bisacodyl  10 mg Rectal BID   carvedilol  6.25 mg Oral BID WC   enoxaparin (LOVENOX) injection  30 mg Subcutaneous Q24H   hydroxychloroquine  200 mg Oral Once per day on Mon Tue Wed Thu Fri   pantoprazole  40 mg Oral Daily   polyethylene glycol  17 g Oral BID   pravastatin  40 mg Oral q1800   Continuous Infusions:  sodium chloride 10 mL/hr at 08/17/18 1000   0.9 % NaCl with KCl 20 mEq / L 50 mL/hr at 08/18/18 1655   doxycycline (VIBRAMYCIN) IV Stopped (08/19/18 1521)    Procedures/Studies: Ct Abdomen Pelvis Wo Contrast  Result Date: 08/13/2018 CLINICAL DATA:  Abdominal swelling poor appetite EXAM: CT ABDOMEN AND PELVIS WITHOUT CONTRAST TECHNIQUE: Multidetector CT imaging of the abdomen and pelvis was performed following the standard protocol without IV contrast. COMPARISON:  Report 04/25/2001 FINDINGS: Lower chest: Lung bases demonstrate no pleural effusion. Atelectasis or mild aspiration at the right base. Cardiomegaly with partially visualized intracardiac pacing leads. Coronary vascular calcification. Small moderate hiatal hernia. Hepatobiliary: Multiple calcified gallstones. Mildly lobulated liver margin. No focal hepatic abnormality or biliary dilatation. Pancreas:  Unremarkable. No pancreatic ductal dilatation or surrounding inflammatory changes. Spleen: Normal in size without focal abnormality. Adrenals/Urinary Tract: Adrenal glands are within normal limits. No hydronephrosis. Probable cyst in the mid to lower right kidney. The bladder is unremarkable.  Stomach/Bowel: Fluid-filled stomach. Multiple loops of fluid-filled dilated small bowel measuring up to 3.4 cm. Distal small bowel is decompressed as is the colon. Poorly identified transition point due to lack of enteral contrast but suspected to be within the distal small bowel/pelvis. No intramural air. No definite bowel wall thickening. Vascular/Lymphatic: Extensive aortic atherosclerosis. No aneurysm. No significant adenopathy Reproductive: Uterus unremarkable. 3 x 2.4 cm hyperdense/partially calcified left pelvic sidewall mass. Other: No free air. Slightly dense fluid within the posterior pelvis. Musculoskeletal: Grade 1 anterolisthesis L4 on L5. Mild to moderate superior endplate deformity at L4, new since 2017 comparison radiographs. IMPRESSION: 1. Multiple loops of dilated fluid-filled small bowel, consistent with mechanical small bowel obstruction. Poorly identified transition point due to lack of contrast, suspect that transition point with in the distal ileum in the pelvis, given collapsed appearance of the terminal ileum and colon. No free air at this time. 2. Trace amount of slightly dense fluid within the pelvis, may reflect a small amount of hemorrhagic ascites. 3. Partially calcified/hyperdense 3 cm left pelvic sidewall mass, possibly a lymph node or a left adnexal mass. 4. Mild consolidation at the right lung base which may be secondary to atelectasis, mild pneumonia or aspiration. 5. Cardiomegaly 6. Gallstones.  Slightly lobulated liver margin, question cirrhosis Electronically Signed   By: Donavan Foil M.D.   On: 08/13/2018 21:25   Ct Abdomen Pelvis W Contrast  Result Date: 08/19/2018 CLINICAL DATA:   Abdominal pain, nasogastric tube placement EXAM: CT ABDOMEN AND PELVIS WITH CONTRAST TECHNIQUE: Multidetector CT imaging of the abdomen and pelvis was performed using the standard protocol following bolus administration of intravenous contrast. CONTRAST:  42mL OMNIPAQUE IOHEXOL 300 MG/ML  SOLN COMPARISON:  08/13/2018 FINDINGS: Lower chest: Small right pleural effusion with some dependent atelectasis/consolidation in the posterior right lower lobe. Transvenous pacing leads. Trace pericardial effusion. Hepatobiliary: Multiple subcentimeter partially calcified stones in the dependent aspect of the nondilated gallbladder. No liver lesion. No biliary ductal dilatation. Pancreas: Mild diffuse parenchymal atrophy without mass or ductal dilatation. Spleen: Normal in size without focal abnormality. Adrenals/Urinary Tract: 2.3 cm right lower pole renal cyst. No hydronephrosis. Normal adrenal glands. Urinary bladder is nondistended. Stomach/Bowel: Nasogastric tube extends to the gastric fundus. The stomach is incompletely distended. Multiple dilated small bowel loops with incomplete distal passage of oral contrast material. Transition point is generally localized to the right pelvis but difficult to discretely identified. Distal-most loops of ileum including TI are decompressed. The colon is decompressed. No pneumatosis. Vascular/Lymphatic: Heavy aortoiliac calcified plaque without aneurysm. Portal vein patent. No definite abdominal or pelvic adenopathy. Reproductive: Uterus unremarkable. Stable well-demarcated 3.7 cm hyperdense/partially calcified left pelvic sidewall mass. Right adnexal region is inseparable from adjacent bowel loops. Other: Trace pelvic ascites. No free air. Musculoskeletal: Stable L4 compression deformity. No acute fracture or worrisome bone lesion. IMPRESSION: 1. High-grade small bowel obstruction, with transition point localized in the right pelvis. Possible etiologies include adhesions, appendicitis, or  pelvic mass inseparable from adjacent bowel loops. 2. Small right pleural effusion. 3. Cholelithiasis. Electronically Signed   By: Lucrezia Europe M.D.   On: 08/19/2018 15:24   Dg Chest Port 1 View  Result Date: 08/13/2018 CLINICAL DATA:  Abdominal pain EXAM: PORTABLE CHEST 1 VIEW COMPARISON:  12/13/2015 FINDINGS: Left AICD remains in place, unchanged. Cardiomegaly with vascular congestion. Suspect early interstitial edema. Low volumes with bibasilar atelectasis and small effusions. No acute bony abnormality. IMPRESSION: Findings compatible with mild edema/CHF. Low volumes with bibasilar atelectasis and small effusions. Electronically Signed  By: Rolm Baptise M.D.   On: 08/13/2018 19:38   Dg Chest Port 1v Same Day  Result Date: 08/13/2018 CLINICAL DATA:  Is NG tube placement EXAM: PORTABLE CHEST 1 VIEW COMPARISON:  08/13/2018 FINDINGS: NG tube is in place with the tip in the proximal stomach. Left AICD is unchanged. Cardiomegaly, bibasilar atelectasis. No overt edema or visible effusions. No acute bony abnormality. IMPRESSION: NG tube tip in the proximal stomach. Cardiomegaly, bibasilar atelectasis. Electronically Signed   By: Rolm Baptise M.D.   On: 08/13/2018 22:31   Dg Abd 2 Views  Result Date: 08/17/2018 CLINICAL DATA:  Bowel obstruction EXAM: ABDOMEN - 2 VIEW COMPARISON:  CT 08/13/2018 FINDINGS: Cardiac pacing device in the left lower chest. No free air on upright view. Worsened distension of small bowel loops, measuring up to 4.6 cm. Paucity of distal gas. IMPRESSION: Findings consistent with high-grade mechanical bowel obstruction with interval worsening of small bowel dilatation. No definite free air identified. Electronically Signed   By: Donavan Foil M.D.   On: 08/17/2018 15:52    Orson Eva, DO  [facility] Pager [phone]  If 7PM-7AM, please contact night-coverage [email or web address] Password [email or web address] 08/19/2018, 4:25 PM   LOS: 6 days

## 2018-08-19 NOTE — Progress Notes (Signed)
Received call from patient's husband Zurie Platas. Stated he had missed the call from MD regarding update on his wife and left cell phone number where he can be reached. Notified MD. Husband stated he is aware patient will be transferred to [facility] when [facility] arrives for transport. Nursing to give him a call once patient is en route for transfer. Donavan Foil, RN

## 2018-08-19 NOTE — Progress Notes (Signed)
Mobility Tech Note  Patient Details Name: TASHARI SCHOENFELDER MRN: [id number] DOB: Jul 25, 1940   Patient transferred from bed to chair with much assistance using RW.      4:44 PM, 08/19/18 Lonell Grandchild, MPT Physical Therapist with [facility] [phone] office [phone] mobile phone

## 2018-08-19 NOTE — Progress Notes (Signed)
Echocardiogram 2D Echocardiogram has been performed.  [name] 08/19/2018, 3:55 PM

## 2018-08-19 NOTE — Progress Notes (Signed)
[facility] notified of need for transport to [facility] [id number]. Spoke with Marden Noble who took patient information and will dispatch team as soon as available. Patient and family aware of transfer. Husband requests call from nursing staff when patient is en route if possible. Will notify night shift RN. Donavan Foil, RN

## 2018-08-19 NOTE — Progress Notes (Signed)
Report given to [facility], and report given to nurse on  5w.  Husband notified of patient transfer.

## 2018-08-19 NOTE — TOC Progression Note (Signed)
Transition of Care [facility]) - Progression Note    Patient Details  Name: [name] MRN: [id number] Date of Birth: [birth year]  Transition of Care [facility]) CM/SW Contact  Owen Pagnotta, Chauncey Reading, RN Phone Number: 08/19/2018, 12:12 PM  Clinical Narrative:    See initial TOC note. TOC team continuing to follow patient and will address disposition needs as/when appropriate. Patient continues to have NG tube. Plan is for CT of abdomen today to reassess.    Expected Discharge Plan: [place] Barriers to Discharge: Continued Medical Work up  Expected Discharge Plan and Services Expected Discharge Plan: [place] arrangements for the past 2 months: Single Family Home                      Social Determinants of Health (SDOH) Interventions    Readmission Risk Interventions Readmission Risk Prevention Plan 08/14/2018  Transportation Screening Complete  Some recent data might be hidden

## 2018-08-20 ENCOUNTER — Encounter (HOSPITAL_COMMUNITY): Payer: Self-pay | Admitting: Student

## 2018-08-20 ENCOUNTER — Inpatient Hospital Stay (HOSPITAL_COMMUNITY): Payer: Medicare Other

## 2018-08-20 DIAGNOSIS — I5042 Chronic combined systolic (congestive) and diastolic (congestive) heart failure: Secondary | ICD-10-CM

## 2018-08-20 LAB — BASIC METABOLIC PANEL
Anion gap: 14 (ref 5–15)
BUN: 52 mg/dL — ABNORMAL HIGH (ref 8–23)
CO2: 17 mmol/L — ABNORMAL LOW (ref 22–32)
Calcium: 8.5 mg/dL — ABNORMAL LOW (ref 8.9–10.3)
Chloride: 115 mmol/L — ABNORMAL HIGH (ref 98–111)
Creatinine, Ser: 1.18 mg/dL — ABNORMAL HIGH (ref 0.44–1.00)
GFR calc Af Amer: 51 mL/min — ABNORMAL LOW (ref >60)
GFR calc non Af Amer: 44 mL/min — ABNORMAL LOW (ref >60)
Glucose, Bld: 74 mg/dL (ref 70–99)
Potassium: 4.1 mmol/L (ref 3.5–5.1)
Sodium: 146 mmol/L — ABNORMAL HIGH (ref 135–145)

## 2018-08-20 LAB — ABO/RH: ABO/RH(D): AB POS

## 2018-08-20 LAB — MAGNESIUM: Magnesium: 2.6 mg/dL — ABNORMAL HIGH (ref 1.7–2.4)

## 2018-08-20 MED ORDER — SODIUM CHLORIDE 0.9% IV SOLUTION: Freq: Once | INTRAVENOUS | Status: DC

## 2018-08-20 NOTE — Progress Notes (Signed)
Pt was transferred to the unit from [facility]. Pt vitals were stable upon arrival to 5 W (BP 132/68 (86), Oral Temp 98.6, pulse 84, 99% RA), upon getting report from [facility] vitals had not been taking on pt since 3:57p. Pt has a history of breast cancer, left side restriction bracelet was placed. Forestine Na suggested a PICC line be placed due to pt being a hard stick. Skin was checked, no visible issues noted. Pt does have a healed scar with a foam placed in the area for protection.

## 2018-08-20 NOTE — Progress Notes (Addendum)
   [employer] representative Tomi Bamberger) to assist with CRT-D device perioperatively. Called and spoke with Tomi Bamberger personally. Asked RN to make sure there is the emergency device management sheet with the correct protocols in patient's paper chart. Anesthesiology/surgical teams to contact [employer] with any questions.  Darreld Mclean, PA-C 08/20/2018 4:25 PM

## 2018-08-20 NOTE — Consult Note (Signed)
   [facility] Inpatient Consult   08/20/2018  [name] 1940/08/12 [id number]  Patient screened for high risk score and hospitalizations to check if potential [place] Management services are needed . Patient was hospitalized per MD progress notes for this 78 year old female with a history of CAD, AICD, systolic and diastolic CHF, CKD stage III, hyperlipidemia, hypertension, rheumatoid arthritis, and anemia of chronic disease presenting with 3-day history of abdominal pain and distention.  There was loose stool without any emesis.  There is no fevers, chills, chest pain, coughing, sore throat.  CT of the abdomen and pelvis showed multiple dilated loops of small bowel consistent with small bowel obstruction.  There is a suspected transition point in the distal ileum.  General surgery was consulted to assist with management.  There was concern of a consolidation in right lower lobe whether it was atelectasis versus pneumonia.  Patient was started on doxycycline.  Chart review also reveals the patient is for surgical intervention on 08/21/2018.   Will follow for progression and community follow up needs as appropriate.  Please place a [facility] Care Management consult or for questions contact:    [name], RN BSN [facility] Liaison  [phone] business mobile phone Toll free office [phone]

## 2018-08-20 NOTE — Consult Note (Signed)
Cardiology Consult    Patient ID: [name] MRN: [id number], DOB/AGE: 12-20-1940   Admit date: 08/13/2018 Date of Consult: 08/20/2018  Primary Physician: [name], MD Primary Cardiologist: Kate Sable, MD Requesting Provider: Orson Eva, MD  Patient Profile    [name] is a 78 y.o. female with a history of chronic combined systolic and diastolic CHF with EF of 54-00% on Echo in 06/2015 s/p AICD, CAD with remote MI, hypertension, hyperlipidemia, CKD stage III, chronic anemia, and GERD who is being seen today for a preoperative evaluation at the request of Dr. Carles Collet.  History of Present Illness    [name] is a 78 year old female with the above history who is followed by Dr. [name]. Patient has a history of CAD with a remote MI s/p PTCA by patient report but no records of this. Last ischemic evaluation was a nuclear stress test in 2009 which showed evidence of scar in the septal, inferior, and apical distributions but no evidence of inducible ischemia. Patient had an AICD placed in 2014 for primary prevention given cardiomyopathy (felt to be ischemic) with EF of 25% at that time. Most recent Echo from 06/2015 showed EF of 30-35% with diffuse hypokinesis and akinesis of the basal inferior myocardium and grade 1 diastolic dysfunction. Patient was last seen by Dr. [name] in 03/2018 at which time patient denied any cardiac symptoms.   Patient presented to the [facility] ED via EMS for evaluation of abdominal pain and swelling, diarrhea, and decreased appetite. During initial work-up, patient was found to have a small bowel obstruction as well as community acquired pneumonia. Patient was admitted for further management. She was made NPO with IV fluids and NGT suctioning. She was also started on antibiotics for the pneumonia. Surgery was consulted and patient was felt to be a high-risk surgical candidate given her comorbidities and active pneumonia; therefore, patient was  initially treated conservatively. Repeat CT of abdomen and pelvis on 08/19/2018 showed persistent high grade small bowel obstruction with transition in right hemipelvis. General surgery recommended transfer to [facility] for operative intervention. Per Surgery note, plan is for ex lap tomorrow. Cardiology was consulted for a pre-operative evaluation.  Given the current COVID-19 pandemic and in an effort to minimize exposure and conserve PPE, initial plan was to call into patient's room to obtain history. However, patient is very hard of hearing making this difficult. Per chart review, patient has denied chest pain or shortness of breath this admission. MD to see patient.  Spoke with RN who said she has had a couple of runs of non-sustained VT today with the longest being 13 beats. Unable to review telemetry remotely. MD to review.   Past Medical History   Past Medical History:  Diagnosis Date   Anemia    Hgb of 9-10   Anemia of chronic disease    Hgb of 9-10 chronically; 06/2010: H&H-10.7/33.5, MCV-81, normal iron studies in 2010    Arteriosclerotic cardiovascular disease (ASCVD)    Remote PTCA by patient report; LBBB; associated cardiomyopathy, presumed ischemic with EF 40-45% previously, 20% in 06/2009; h/o clinical congestive heart failure; negative stress nuclear in 2009 with inferoseptal and apical scar   Automatic implantable cardioverter-defibrillator in situ    Breast cancer (HCC)    breast   Chronic bronchitis (HCC)    Chronic renal disease, stage 3, moderately decreased glomerular filtration rate (GFR) between 30-59 mL/min/1.73 square meter ([place]) 02/01/2016   Congestive heart failure (CHF) ([place])  Elevated cholesterol    Elevated sed rate    GERD (gastroesophageal reflux disease)    GI bleed    Gout    HOH (hard of hearing)    Hyperlipidemia    Hypertension    Rheumatoid arthritis ([place])    UTI (urinary tract infection)     Past Surgical History:  Procedure  Laterality Date   BI-VENTRICULAR IMPLANTABLE CARDIOVERTER DEFIBRILLATOR N/A 07/28/2012   Procedure: BI-VENTRICULAR IMPLANTABLE CARDIOVERTER DEFIBRILLATOR  (CRT-D);  Surgeon: Evans Lance, MD;  Location: [facility] CATH LAB;  Service: Cardiovascular;  Laterality: N/A;   BI-VENTRICULAR IMPLANTABLE CARDIOVERTER DEFIBRILLATOR  (CRT-D)  07/28/2012   BREAST BIOPSY Bilateral    CATARACT EXTRACTION W/ INTRAOCULAR LENS IMPLANT Left    CATARACT EXTRACTION W/PHACO Right 09/15/2013   Procedure: CATARACT EXTRACTION PHACO AND INTRAOCULAR LENS PLACEMENT ([place]);  Surgeon: Elta Guadeloupe T. Gershon Crane, MD;  Location: [facility] ORS;  Service: Ophthalmology;  Laterality: Right;  CDE:  10.74   COLONOSCOPY N/A 03/04/2015   Procedure: COLONOSCOPY;  Surgeon: Rogene Houston, MD;  Location: [facility] ENDO SUITE;  Service: Endoscopy;  Laterality: N/A;  10:50    ESOPHAGOGASTRODUODENOSCOPY N/A 03/04/2015   Procedure: ESOPHAGOGASTRODUODENOSCOPY (EGD);  Surgeon: Rogene Houston, MD;  Location: [facility] ENDO SUITE;  Service: Endoscopy;  Laterality: N/A;   ESOPHAGOGASTRODUODENOSCOPY N/A 09/21/2016   Procedure: ESOPHAGOGASTRODUODENOSCOPY (EGD);  Surgeon: Rogene Houston, MD;  Location: [facility] END[facility];  Service: Endoscopy;  Laterality: N/A;   GIVENS CAPSULE STUDY N/A 09/22/2016   Procedure: GIVENS CAPSULE STUDY;  Surgeon: Rogene Houston, MD;  Location: [facility] END[facility];  Service: Endoscopy;  Laterality: N/A;   MASTECTOMY Left 1998   TENDON TRANSFER Right 08/15/2017   Procedure: TENDON TRANSFER RIGHT WRIST EXTENSORS AS NEEDED, ULNA RESECTION AND STABILIZATION;  Surgeon: Charlotte Crumb, MD;  Location: [place];  Service: Orthopedics;  Laterality: Right;   TOTAL KNEE ARTHROPLASTY Right    Dr.Harrison   TUBAL LIGATION       Allergies  Allergies  Allergen Reactions   Macrobid [Nitrofurantoin Macrocrystal] Hives and Itching    Inpatient Medications     bisacodyl  10 mg Rectal BID   enoxaparin (LOVENOX) injection  30 mg Subcutaneous Q24H    hydroxychloroquine  200 mg Oral Once per day on Mon Tue Wed Thu Fri   metoprolol tartrate  2.5 mg Intravenous Q6H   pantoprazole (PROTONIX) IV  40 mg Intravenous Q24H    Family History    Family History  Problem Relation Age of Onset   Diabetes Father    Pancreatic cancer Father    Hypertension Brother    She indicated that her mother is deceased. She indicated that her father is deceased. She indicated that two of her six brothers are alive. She indicated that her daughter is deceased. She indicated that her other is alive.   Social History    Social History   Socioeconomic History   Marital status: Married    Spouse name: Not on file   Number of children: Not on file   Years of education: Not on file   Highest education level: Not on file  Occupational History   Occupation: Retired  Scientist, product/process development strain: Not very hard   Food insecurity:    Worry: Never true    Inability: Never true   Transportation needs:    Medical: No    Non-medical: No  Tobacco Use   Smoking status: Former Smoker    Packs/day: 0.25    Years: 30.00  Pack years: 7.50    Types: Cigarettes    Start date: 09/17/1956    Last attempt to quit: 04/24/2007    Years since quitting: 11.3   Smokeless tobacco: Current User    Types: Chew   Tobacco comment: 07/03/2013 "smoked some; don't know how much or how long or when I quit"  Substance and Sexual Activity   Alcohol use: No    Alcohol/week: 0.0 standard drinks   Drug use: No   Sexual activity: Not Currently    Birth control/protection: Post-menopausal  Lifestyle   Physical activity:    Days per week: Not on file    Minutes per session: Not on file   Stress: Not on file  Relationships   Social connections:    Talks on phone: Not on file    Gets together: Not on file    Attends religious service: Not on file    Active member of club or organization: Not on file    Attends meetings of clubs or  organizations: Not on file    Relationship status: Not on file   Intimate partner violence:    Fear of current or ex partner: No    Emotionally abused: No    Physically abused: No    Forced sexual activity: No  Other Topics Concern   Not on file  Social History Narrative   Married, lives with spouse   1 daughter, died in 90 in a car accident   Retired - worked in [employer] fundraiser   No recent travel     Review of Systems    Please see HPI. Otherwise, negative.  Physical Exam   Physical Exam per MD: Blood pressure 120/63, pulse 87, temperature 98.4 F (36.9 C), temperature source Oral, resp. rate 16, height 5\' 4"  (1.626 m), weight 64.8 kg, SpO2 98 %.  General: 78 y.o. female resting comfortably in no acute distress. Pleasant and cooperative.  She is extremely hard of hearing HEENT: Normal  Neck: Supple. No carotid bruits or JVD appreciated. Lungs: No increased work of breathing. Clear to auscultation bilaterally. No wheezes, rhonchi, or rales. Heart: RRR. Distinct S1 and S2. No murmurs, gallops, or rubs.  Abdomen: Soft, nontender, distended Extremities: No clubbing, cyanosis or edema. Radial, posterior tibial, and distal pedal pulses 2+ and equal bilaterally. Skin: Warm and dry. Neuro: Cooperative, answers questions appropriately, difficult for me to tell if she is alert and oriented to all spheres because she is so hard of hearing Psych: Normal affect.    Labs    Troponin (Point of Care Test) No results for input(s): TROPIPOC in the last 72 hours. No results for input(s): CKTOTAL, CKMB, TROPONINI in the last 72 hours. Lab Results  Component Value Date   WBC 17.5 (H) 08/19/2018   HGB 8.8 (L) 08/19/2018   HCT 29.2 (L) 08/19/2018   MCV 85.9 08/19/2018   PLT 275 08/19/2018    Recent Labs  Lab 08/16/18 0635  08/19/18 0545  NA 143   < > 142  K 3.3*   < > 4.5  CL 112*   < > 113*  CO2 21*   < > 16*  BUN 65*   < > 68*  CREATININE 1.19*   < > 1.29*  CALCIUM 8.2*   < >  8.5*  PROT 6.6  --   --   BILITOT 1.2  --   --   ALKPHOS 133*  --   --   ALT 28  --   --  AST 45*  --   --   GLUCOSE 88   < > 91   < > = values in this interval not displayed.   Lab Results  Component Value Date   CHOL 134 05/23/2012   HDL 30 (L) 05/23/2012   LDLCALC 83 05/23/2012   TRIG 103 05/23/2012   No results found for: [facility]   Radiology Studies    Ct Abdomen Pelvis Wo Contrast  Result Date: 08/13/2018 CLINICAL DATA:  Abdominal swelling poor appetite EXAM: CT ABDOMEN AND PELVIS WITHOUT CONTRAST TECHNIQUE: Multidetector CT imaging of the abdomen and pelvis was performed following the standard protocol without IV contrast. COMPARISON:  Report 04/25/2001 FINDINGS: Lower chest: Lung bases demonstrate no pleural effusion. Atelectasis or mild aspiration at the right base. Cardiomegaly with partially visualized intracardiac pacing leads. Coronary vascular calcification. Small moderate hiatal hernia. Hepatobiliary: Multiple calcified gallstones. Mildly lobulated liver margin. No focal hepatic abnormality or biliary dilatation. Pancreas: Unremarkable. No pancreatic ductal dilatation or surrounding inflammatory changes. Spleen: Normal in size without focal abnormality. Adrenals/Urinary Tract: Adrenal glands are within normal limits. No hydronephrosis. Probable cyst in the mid to lower right kidney. The bladder is unremarkable. Stomach/Bowel: Fluid-filled stomach. Multiple loops of fluid-filled dilated small bowel measuring up to 3.4 cm. Distal small bowel is decompressed as is the colon. Poorly identified transition point due to lack of enteral contrast but suspected to be within the distal small bowel/pelvis. No intramural air. No definite bowel wall thickening. Vascular/Lymphatic: Extensive aortic atherosclerosis. No aneurysm. No significant adenopathy Reproductive: Uterus unremarkable. 3 x 2.4 cm hyperdense/partially calcified left pelvic sidewall mass. Other: No free air. Slightly dense fluid  within the posterior pelvis. Musculoskeletal: Grade 1 anterolisthesis L4 on L5. Mild to moderate superior endplate deformity at L4, new since 2017 comparison radiographs. IMPRESSION: 1. Multiple loops of dilated fluid-filled small bowel, consistent with mechanical small bowel obstruction. Poorly identified transition point due to lack of contrast, suspect that transition point with in the distal ileum in the pelvis, given collapsed appearance of the terminal ileum and colon. No free air at this time. 2. Trace amount of slightly dense fluid within the pelvis, may reflect a small amount of hemorrhagic ascites. 3. Partially calcified/hyperdense 3 cm left pelvic sidewall mass, possibly a lymph node or a left adnexal mass. 4. Mild consolidation at the right lung base which may be secondary to atelectasis, mild pneumonia or aspiration. 5. Cardiomegaly 6. Gallstones.  Slightly lobulated liver margin, question cirrhosis Electronically Signed   By: Donavan Foil M.D.   On: 08/13/2018 21:25   Ct Abdomen Pelvis W Contrast  Result Date: 08/19/2018 CLINICAL DATA:  Abdominal pain, nasogastric tube placement EXAM: CT ABDOMEN AND PELVIS WITH CONTRAST TECHNIQUE: Multidetector CT imaging of the abdomen and pelvis was performed using the standard protocol following bolus administration of intravenous contrast. CONTRAST:  48mL OMNIPAQUE IOHEXOL 300 MG/ML  SOLN COMPARISON:  08/13/2018 FINDINGS: Lower chest: Small right pleural effusion with some dependent atelectasis/consolidation in the posterior right lower lobe. Transvenous pacing leads. Trace pericardial effusion. Hepatobiliary: Multiple subcentimeter partially calcified stones in the dependent aspect of the nondilated gallbladder. No liver lesion. No biliary ductal dilatation. Pancreas: Mild diffuse parenchymal atrophy without mass or ductal dilatation. Spleen: Normal in size without focal abnormality. Adrenals/Urinary Tract: 2.3 cm right lower pole renal cyst. No  hydronephrosis. Normal adrenal glands. Urinary bladder is nondistended. Stomach/Bowel: Nasogastric tube extends to the gastric fundus. The stomach is incompletely distended. Multiple dilated small bowel loops with incomplete distal passage of oral contrast  material. Transition point is generally localized to the right pelvis but difficult to discretely identified. Distal-most loops of ileum including TI are decompressed. The colon is decompressed. No pneumatosis. Vascular/Lymphatic: Heavy aortoiliac calcified plaque without aneurysm. Portal vein patent. No definite abdominal or pelvic adenopathy. Reproductive: Uterus unremarkable. Stable well-demarcated 3.7 cm hyperdense/partially calcified left pelvic sidewall mass. Right adnexal region is inseparable from adjacent bowel loops. Other: Trace pelvic ascites. No free air. Musculoskeletal: Stable L4 compression deformity. No acute fracture or worrisome bone lesion. IMPRESSION: 1. High-grade small bowel obstruction, with transition point localized in the right pelvis. Possible etiologies include adhesions, appendicitis, or pelvic mass inseparable from adjacent bowel loops. 2. Small right pleural effusion. 3. Cholelithiasis. Electronically Signed   By: Lucrezia Europe M.D.   On: 08/19/2018 15:24   Dg Chest Port 1 View  Result Date: 08/13/2018 CLINICAL DATA:  Abdominal pain EXAM: PORTABLE CHEST 1 VIEW COMPARISON:  12/13/2015 FINDINGS: Left AICD remains in place, unchanged. Cardiomegaly with vascular congestion. Suspect early interstitial edema. Low volumes with bibasilar atelectasis and small effusions. No acute bony abnormality. IMPRESSION: Findings compatible with mild edema/CHF. Low volumes with bibasilar atelectasis and small effusions. Electronically Signed   By: Rolm Baptise M.D.   On: 08/13/2018 19:38   Dg Chest Port 1v Same Day  Result Date: 08/13/2018 CLINICAL DATA:  Is NG tube placement EXAM: PORTABLE CHEST 1 VIEW COMPARISON:  08/13/2018 FINDINGS: NG tube is  in place with the tip in the proximal stomach. Left AICD is unchanged. Cardiomegaly, bibasilar atelectasis. No overt edema or visible effusions. No acute bony abnormality. IMPRESSION: NG tube tip in the proximal stomach. Cardiomegaly, bibasilar atelectasis. Electronically Signed   By: Rolm Baptise M.D.   On: 08/13/2018 22:31   Dg Abd 2 Views  Result Date: 08/17/2018 CLINICAL DATA:  Bowel obstruction EXAM: ABDOMEN - 2 VIEW COMPARISON:  CT 08/13/2018 FINDINGS: Cardiac pacing device in the left lower chest. No free air on upright view. Worsened distension of small bowel loops, measuring up to 4.6 cm. Paucity of distal gas. IMPRESSION: Findings consistent with high-grade mechanical bowel obstruction with interval worsening of small bowel dilatation. No definite free air identified. Electronically Signed   By: Donavan Foil M.D.   On: 08/17/2018 15:52    EKG     EKG: EKG was personally reviewed and demonstrates: Atrial sensed ventricular paced rhythm, biventricular pacing  Cardiac Imaging   Echocardiogram 08/19/2018: 1. The left ventricle has a visually estimated ejection fraction of approximately 30%. There is inferior basal and inferoseptal akinesis. The cavity size and wall thickness are normal. Diastolic function is indeterminate.  2. The right ventricle has normal systolic function. The cavity was normal. There is no increase in right ventricular wall thickness. Device wire noted in right ventricle. RV-RA gradient 25.9 mmHg, unable to estimate RVSP.  3. Left atrial size was moderately dilated.  4. The aortic valve is tricuspid. Aortic valve regurgitation is trivial by color flow Doppler. Mild aortic annular calcification noted.  5. The mitral valve is grossly normal. There is mild mitral annular calcification present.  6. The aortic root is normal in size and structure.  7. The tricuspid valve is grossly normal.  8. The pericardial effusion is circumferential.  9. Small to moderate pericardial  effusion with associated adipose tissue/organization.  Assessment & Plan    Preoperative Evaluation - Patient was admitted for abdominal pain and swelling, diarrhea, and decreased appetite. She was found to have a small bowel obstruction. Surgery was consulted and has recommended  proceeding with exploratory laparotomy tomorrow. Cardiology was consulted for pre-operative evaluation. - Patient has a history of CAD with self-reported remote PTCA (no records for this) and has known chronic combined CHF s/p AICD. Last ischemic work-up was a nuclear stress test in 2009 which showed no inducible ischemia. Echo from this admission showed LVEF of 30% with inferior basal and inferoseptal akinesis and a small to moderate pericardial effusion.  - Per the Revised Cardiac Risk Index, considered moderate risk with 6.6% chance of having cardiac complications. However, this does not take into account her age, degree of cardiomyopathy with AICD, or recent community acquired pneumonia. Patient may be more high risk.  - MD to follow with further recommendations.  Chronic Combined CHF with EF of 30% s/p AICD - Echo this admission as above. - Patient on Lasix 40mg  daily at home. This was held on admission because patient was felt to be volume depleted.  - Patient on Coreg 6.25mg  twice daily at home. Currently, on IV Lopressor due to inability to tolerate PO. - Patient does not appear to be on an ACEi/ARB/ARNI or spironolactone at home. Will defer to primary Cardiologist. - Continue to monitor volume status closely.  Pericardial Effusion - Echo this admission showed a small to moderate pericardial effusion. - Patient hemodynamically stable.  - Will defer any repeat imaging to MD.  Non-Sustained VT - RN reports patient has had a couple of runs of non-sustained VT today with the longest one being about 13 beats. Unable to review telemetry remotely. MD to review.  - Potassium 4.5 yesterday. - Magnesium 3.0 on  4/26. - Will recheck electrolytes.  - Patient has AICD. Device was last checked in 05/2018.   Small Bowel Obstruction. - Management per Surgery.  Community Acquired Pneumonia - Patient finished 7 day course of Doxycycline. - Management per primary team.  Acute on Chronic Kidney Disease Stage III - Creatinine peaked at 3.21 this admission on 08/13/2018. Creatinine 1.29 today.  - Continue to monitor renal function.  SignedDarreld Mclean, PA-C 08/20/2018, 9:33 AM Pager: [phone] For questions or updates, please contact   Please consult [email or web address] for contact info under Cardiology/STEMI.  Patient seen, examined. Available data reviewed. Agree with findings, assessment, and plan as outlined by Sande Rives, PA-C.  The exam findings documented above reflect those of my personal exam of this patient today.  The patient is admitted with small bowel obstruction for planned surgery tomorrow.  She has chronic systolic heart failure with LVEF 30% status post ICD.  She has coronary artery disease without angina.  She does not have any active signs of congestive heart failure and she is resting comfortably without shortness of breath or orthopnea.  She is at moderate risk of cardiac complications because of her background cardiovascular problems, but she does not appear to have any acute issues.  She does not require any further inpatient cardiac risk stratification or work-up at this point.The patient is cleared for surgery from a cardiac perspective.  All of her outpatient records are reviewed today as part of this evaluation.  We will manage her CRT-D device perioperatively.  Please call if any questions.  Sherren Mocha, M.D. 08/20/2018 1:36 PM

## 2018-08-20 NOTE — Anesthesia Preprocedure Evaluation (Addendum)
Anesthesia Evaluation  Patient identified by MRN, date of birth, ID band Patient confused    Reviewed: Allergy & Precautions, NPO status , Patient's Chart, lab work & pertinent test results  Airway Mallampati: II  TM Distance: >3 FB Neck ROM: Full    Dental no notable dental hx. (+) Upper Dentures, Lower Dentures   Pulmonary former smoker,    Pulmonary exam normal breath sounds clear to auscultation       Cardiovascular hypertension, + CAD and +CHF  Normal cardiovascular exam+ Cardiac Defibrillator  Rhythm:Regular Rate:Normal  08/19/18 TTE  1. The left ventricle has a visually estimated ejection fraction of approximately 30%. There is inferior basal and inferoseptal akinesis. The cavity size and wall thickness are normal. Diastolic function is indeterminate.  2. The right ventricle has normal systolic function. The cavity was normal. There is no increase in right ventricular wall thickness. Device wire noted in right ventricle. RV-RA gradient 25.9 mmHg, unable to estimate RVSP.   Neuro/Psych negative psych ROS   GI/Hepatic GERD  ,  Endo/Other    Renal/GU CRFRenal diseaseK +4.1 Cr 1.10      Musculoskeletal   Abdominal   Peds  Hematology  (+) anemia , Hgb 8.8 Plt 275   Anesthesia Other Findings   Reproductive/Obstetrics                          Anesthesia Physical Anesthesia Plan  ASA: IV  Anesthesia Plan: General   Post-op Pain Management:    Induction: Intravenous, Cricoid pressure planned and Rapid sequence  PONV Risk Score and Plan: 3 and Treatment may vary due to age or medical condition, Ondansetron and Dexamethasone  Airway Management Planned: Oral ETT  Additional Equipment: Arterial line  Intra-op Plan:   Post-operative Plan: Post-operative intubation/ventilation  Informed Consent: I have reviewed the patients History and Physical, chart, labs and discussed the procedure  including the risks, benefits and alternatives for the proposed anesthesia with the patient or authorized representative who has indicated his/her understanding and acceptance.     Dental advisory given  Plan Discussed with: CRNA  Anesthesia Plan Comments: (GA + Aline)      Anesthesia Quick Evaluation

## 2018-08-20 NOTE — Consult Note (Signed)
Reason for Consult:sbo Referring Physician: Dr. [name] is a 78 y.o. female.  HPI: The patient is an 78 year old black female who presented to [facility] 1 week ago with abd distension. She was found to have a sbo on CT. An ng was placed. After a week she did not show any improvement and with her cardiac history she was transferred to [facility]. Repeat CT still shows sbo. She thinks she is getting better but doesn't seem to be a great historian.  Past Medical History:  Diagnosis Date  . Anemia    Hgb of 9-10  . Anemia of chronic disease    Hgb of 9-10 chronically; 06/2010: H&H-10.7/33.5, MCV-81, normal iron studies in 2010   . Arteriosclerotic cardiovascular disease (ASCVD)    Remote PTCA by patient report; LBBB; associated cardiomyopathy, presumed ischemic with EF 40-45% previously, 20% in 06/2009; h/o clinical congestive heart failure; negative stress nuclear in 2009 with inferoseptal and apical scar  . Automatic implantable cardioverter-defibrillator in situ   . Breast cancer ([place])    breast  . Chronic bronchitis ([place])   . Chronic renal disease, stage 3, moderately decreased glomerular filtration rate (GFR) between 30-59 mL/min/1.73 square meter (HCC) 02/01/2016  . Congestive heart failure (CHF) ([place])   . Elevated cholesterol   . Elevated sed rate   . GERD (gastroesophageal reflux disease)   . GI bleed   . Gout   . HOH (hard of hearing)   . Hyperlipidemia   . Hypertension   . Rheumatoid arthritis ([place])   . UTI (urinary tract infection)     Past Surgical History:  Procedure Laterality Date  . BI-VENTRICULAR IMPLANTABLE CARDIOVERTER DEFIBRILLATOR N/A 07/28/2012   Procedure: BI-VENTRICULAR IMPLANTABLE CARDIOVERTER DEFIBRILLATOR  (CRT-D);  Surgeon: Evans Lance, MD;  Location: [facility] CATH LAB;  Service: Cardiovascular;  Laterality: N/A;  . BI-VENTRICULAR IMPLANTABLE CARDIOVERTER DEFIBRILLATOR  (CRT-D)  07/28/2012  . BREAST BIOPSY Bilateral   . CATARACT EXTRACTION W/  INTRAOCULAR LENS IMPLANT Left   . CATARACT EXTRACTION W/PHACO Right 09/15/2013   Procedure: CATARACT EXTRACTION PHACO AND INTRAOCULAR LENS PLACEMENT (IOC);  Surgeon: Savannah Guadeloupe T. Gershon Crane, MD;  Location: [facility] ORS;  Service: Ophthalmology;  Laterality: Right;  CDE:  10.74  . COLONOSCOPY N/A 03/04/2015   Procedure: COLONOSCOPY;  Surgeon: Rogene Houston, MD;  Location: [facility] ENDO SUITE;  Service: Endoscopy;  Laterality: N/A;  10:50   . ESOPHAGOGASTRODUODENOSCOPY N/A 03/04/2015   Procedure: ESOPHAGOGASTRODUODENOSCOPY (EGD);  Surgeon: Rogene Houston, MD;  Location: [facility] ENDO SUITE;  Service: Endoscopy;  Laterality: N/A;  . ESOPHAGOGASTRODUODENOSCOPY N/A 09/21/2016   Procedure: ESOPHAGOGASTRODUODENOSCOPY (EGD);  Surgeon: Rogene Houston, MD;  Location: [facility] ENDO SUITE;  Service: Endoscopy;  Laterality: N/A;  . GIVENS CAPSULE STUDY N/A 09/22/2016   Procedure: GIVENS CAPSULE STUDY;  Surgeon: Rogene Houston, MD;  Location: [facility] ENDO SU[facility];  Service: Endoscopy;  Laterality: N/A;  . MASTECTOMY Left 1998  . TENDON TRANSFER Right 08/15/2017   Procedure: TENDON TRANSFER RIGHT WRIST EXTENSORS AS NEEDED, ULNA RESECTION AND STABILIZATION;  Surgeon: Charlotte Crumb, MD;  Location: [facility];  Service: Orthopedics;  Laterality: Right;  . TOTAL KNEE ARTHROPLASTY Right    Dr.Harrison  . TUBAL LIGATION      Family History  Problem Relation Age of Onset  . Diabetes Father   . Pancreatic cancer Father   . Hypertension Brother     Social History:  reports that she quit smoking about 11 years ago. Her smoking use included cigarettes. She  started smoking about 61 years ago. She has a 7.50 pack-year smoking history. Her smokeless tobacco use includes chew. She reports that she does not drink alcohol or use drugs.  Allergies:  Allergies  Allergen Reactions  . Macrobid [Nitrofurantoin Macrocrystal] Hives and Itching    Medications: I have reviewed the patient's current medications.  Results for orders placed or performed during  the hospital encounter of 08/13/18 (from the past 48 hour(s))  Renal function panel     Status: Abnormal   Collection Time: 08/19/18  5:45 AM  Result Value Ref Range   Sodium 142 135 - 145 mmol/L   Potassium 4.5 3.5 - 5.1 mmol/L   Chloride 113 (H) 98 - 111 mmol/L   CO2 16 (L) 22 - 32 mmol/L   Glucose, Bld 91 70 - 99 mg/dL   BUN 68 (H) 8 - 23 mg/dL   Creatinine, Ser 1.29 (H) 0.44 - 1.00 mg/dL   Calcium 8.5 (L) 8.9 - 10.3 mg/dL   Phosphorus 4.7 (H) 2.5 - 4.6 mg/dL   Albumin 2.3 (L) 3.5 - 5.0 g/dL   GFR calc non Af Amer 39 (L) >60 mL/min   GFR calc Af Amer 46 (L) >60 mL/min   Anion gap 13 5 - 15    Comment: Performed at [facility], [address], [place], [place] [address]  CBC     Status: Abnormal   Collection Time: 08/19/18  5:45 AM  Result Value Ref Range   WBC 17.5 (H) 4.0 - 10.5 K/uL   RBC 3.40 (L) 3.87 - 5.11 MIL/uL   Hemoglobin 8.8 (L) 12.0 - 15.0 g/dL   HCT 29.2 (L) 36.0 - 46.0 %   MCV 85.9 80.0 - 100.0 fL   MCH 25.9 (L) 26.0 - 34.0 pg   MCHC 30.1 30.0 - 36.0 g/dL   RDW 21.2 (H) 11.5 - 15.5 %   Platelets 275 150 - 400 K/uL   nRBC 0.6 (H) 0.0 - 0.2 %    Comment: Performed at [facility], [address] [place], [place] [address]    Ct Abdomen Pelvis W Contrast  Result Date: 08/19/2018 CLINICAL DATA:  Abdominal pain, nasogastric tube placement EXAM: CT ABDOMEN AND PELVIS WITH CONTRAST TECHNIQUE: Multidetector CT imaging of the abdomen and pelvis was performed using the standard protocol following bolus administration of intravenous contrast. CONTRAST:  62mL OMNIPAQUE IOHEXOL 300 MG/ML  SOLN COMPARISON:  08/13/2018 FINDINGS: Lower chest: Small right pleural effusion with some dependent atelectasis/consolidation in the posterior right lower lobe. Transvenous pacing leads. Trace pericardial effusion. Hepatobiliary: Multiple subcentimeter partially calcified stones in the dependent aspect of the nondilated gallbladder. No liver lesion. No biliary ductal dilatation. Pancreas:  Mild diffuse parenchymal atrophy without mass or ductal dilatation. Spleen: Normal in size without focal abnormality. Adrenals/Urinary Tract: 2.3 cm right lower pole renal cyst. No hydronephrosis. Normal adrenal glands. Urinary bladder is nondistended. Stomach/Bowel: Nasogastric tube extends to the gastric fundus. The stomach is incompletely distended. Multiple dilated small bowel loops with incomplete distal passage of oral contrast material. Transition point is generally localized to the right pelvis but difficult to discretely identified. Distal-most loops of ileum including TI are decompressed. The colon is decompressed. No pneumatosis. Vascular/Lymphatic: Heavy aortoiliac calcified plaque without aneurysm. Portal vein patent. No definite abdominal or pelvic adenopathy. Reproductive: Uterus unremarkable. Stable well-demarcated 3.7 cm hyperdense/partially calcified left pelvic sidewall mass. Right adnexal region is inseparable from adjacent bowel loops. Other: Trace pelvic ascites. No free air. Musculoskeletal: Stable L4 compression deformity. No acute  fracture or worrisome bone lesion. IMPRESSION: 1. High-grade small bowel obstruction, with transition point localized in the right pelvis. Possible etiologies include adhesions, appendicitis, or pelvic mass inseparable from adjacent bowel loops. 2. Small right pleural effusion. 3. Cholelithiasis. Electronically Signed   By: Lucrezia Europe M.D.   On: 08/19/2018 15:24    Review of Systems  Constitutional: Negative.   HENT: Negative.   Eyes: Negative.   Respiratory: Negative.   Cardiovascular: Negative.   Gastrointestinal: Negative.   Genitourinary: Negative.   Musculoskeletal: Negative.   Skin: Negative.   Neurological: Negative.   Endo/Heme/Allergies: Negative.   Psychiatric/Behavioral: Negative.    Blood pressure 120/63, pulse 87, temperature 98.4 F (36.9 C), temperature source Oral, resp. rate 16, height 5\' 4"  (1.626 m), weight 64.8 kg, SpO2 98  %. Physical Exam  Constitutional: She is oriented to person, place, and time. She appears well-developed and well-nourished. No distress.  HENT:  Head: Normocephalic and atraumatic.  Mouth/Throat: No oropharyngeal exudate.  Eyes: Pupils are equal, round, and reactive to light. Conjunctivae and EOM are normal.  Neck: Normal range of motion. Neck supple.  Cardiovascular: Normal rate, regular rhythm and normal heart sounds.  Respiratory: Effort normal and breath sounds normal. No stridor. No respiratory distress.  GI: Soft. She exhibits distension. There is no abdominal tenderness. There is no guarding.  Musculoskeletal: Normal range of motion.        General: No tenderness or edema.  Neurological: She is alert and oriented to person, place, and time. Coordination normal.  Skin: Skin is warm and dry. No erythema.  Psychiatric: She has a normal mood and affect. Her behavior is normal.  Seems disoriented to situation but pleasant    Assessment/Plan: The patient appears to have a persistent sbo. She got oral contrast yesterday so we will repeat abd xrays today. We are waiting for cardiology to see since she has an AICD. Will plan for ex lap tomorrow am. Will talk to family  [name] III 08/20/2018, 9:58 AM

## 2018-08-20 NOTE — Progress Notes (Signed)
Unable to obtain emergence device management sheet. Page sent to cardiology group.

## 2018-08-20 NOTE — Progress Notes (Signed)
PROGRESS NOTE  [name] WGY:[id number] DOB: [birth year] DOA: 08/13/2018 PCP: [name], MD   LOS: 7 days   Brief Narrative / Interim history: 78 year old female with history of CAD, AICD in place, combined [place] and diastolic CHF, chronic kidney disease stage III, hypertension, hyperlipidemia, RA, anemia of chronic disease who was admitted to the hospital at any [facility] on 08/13/2018 with 3-day history of abdominal pain, distention.  She was diagnosed with small bowel obstruction and general surgery were consulted.  She was treated initially with conservative management with n.p.o., NG tube, and failed to improve.  A repeat CT scan of the abdomen and pelvis on 4/28 showed persistent high-grade small bowel obstruction.  Due to underlying cardiac history as well as potential need for surgery patient was transferred to [facility] for cardiology and surgery evaluation  Subjective: She denies any abdominal pain, nausea or vomiting.  NG tube is in place.  Symptomatically she states that she is feeling a little bit better.  Assessment & Plan: Principal Problem:   CAP (community acquired pneumonia) Active Problems:   Dyslipidemia   Anemia of chronic disease   CAD S/P remote PCI- no details   Chronic combined systolic and diastolic CHF (congestive heart failure) (HCC)   AKI (acute kidney injury) ([place])   SBO (small bowel obstruction) (HCC)   Pressure injury of skin   Acute renal failure superimposed on stage 3 chronic kidney disease (HCC)   Principal Problem High-grade small bowel obstruction -Patient is a high risk surgical candidate, was transferred to [facility].  Cardiology was consulted for preoperative clearance at surgery's request. -General surgery consulted as well, discussed with Dr. Marlou Starks, will repeat abdominal x-ray this afternoon and potentially for ex lap tomorrow morning  Active Problems Chronic systolic and diastolic CHF -Echo done in 2017 showed an EF of  30-35%, diffuse HK.  Repeat echo on 08/19/2018 showed similar EF of 30%, small pericardial effusion, mild TR/MR and inferoseptal inferobasal AK. -Cardiology consulted for preop clearance, appreciate input  Right lower lobe opacity /lobar pneumonia -Patient status post treatment with 7 days of doxycycline.  She is afebrile without any respiratory symptoms satting well on room air.  Acute on chronic renal failure, chronic kidney disease stage III -Baseline creatinine 1.0-1.3, this was due to volume depletion in the setting of GI losses and poor p.o. intake prior to admission, serum creatinine peaked at 3.2.  Now resolved.  RA -Holding Plaquenil until able to tolerate p.o.  Hyperlipidemia -Restart statin when able to tolerate p.o.  Hypokalemia -Replete  Stage II sacral decubitus -Present at the time of admission, local wound care   Scheduled Meds:  sodium chloride   Intravenous Once   bisacodyl  10 mg Rectal BID   enoxaparin (LOVENOX) injection  30 mg Subcutaneous Q24H   hydroxychloroquine  200 mg Oral Once per day on Mon Tue Wed Thu Fri   metoprolol tartrate  2.5 mg Intravenous Q6H   pantoprazole (PROTONIX) IV  40 mg Intravenous Q24H   Continuous Infusions:  sodium chloride 10 mL/hr at 08/17/18 1000   0.9 % NaCl with KCl 20 mEq / L 50 mL/hr at 08/20/18 0959   PRN Meds:.sodium chloride, acetaminophen **OR** acetaminophen, HYDROmorphone (DILAUDID) injection, ondansetron **OR** ondansetron (ZOFRAN) IV  DVT prophylaxis: Lovenox Code Status: Full code Family Communication: no family at bedside  Disposition Plan: home when ready   Consultants:   General surgery   Cardiology   Procedures:   2D echo  Antimicrobials:  S/p  doxycycline    Objective: Vitals:   08/19/18 1551 08/19/18 2209 08/20/18 0538 08/20/18 1203  BP: (!) 125/56 132/68 120/63 122/80  Pulse: 89 84 87 80  Resp: 16   18  Temp: 98.2 F (36.8 C) 98.6 F (37 C) 98.4 F (36.9 C) 98.7 F (37.1 C)    TempSrc: Oral Oral Oral Oral  SpO2: 96% 99% 98% 96%  Weight:      Height:        Intake/Output Summary (Last 24 hours) at 08/20/2018 1235 Last data filed at 08/20/2018 0953 Gross per 24 hour  Intake 1694.17 ml  Output 900 ml  Net 794.17 ml   Filed Weights   08/13/18 2000 08/14/18 0017 08/14/18 0500  Weight: 66.7 kg 64.8 kg 64.8 kg    Examination:  Constitutional: NAD Eyes: PERRL, lids and conjunctivae normal ENMT: Mucous membranes are moist. Respiratory: clear to auscultation bilaterally, no wheezing, no crackles. Normal respiratory effort.  Cardiovascular: Regular rate and rhythm, no murmurs / rubs / gallops. No LE edema. Abdomen: no tenderness. Bowel sounds positive.  Musculoskeletal: no clubbing / cyanosis. Skin: no rashes Neurologic: CN 2-12 grossly intact. Strength 5/5 in all 4.  Psychiatric: Normal judgment and insight. Alert and oriented x 3. Normal mood.    Data Reviewed: I have independently reviewed following labs and imaging studies   CBC: Recent Labs  Lab 08/14/18 0648 08/15/18 0407 08/16/18 0635 08/17/18 0805 08/18/18 0443 08/19/18 0545  WBC 17.7* 12.3* 14.0* 13.5* 14.8* 17.5*  NEUTROABS 14.5* 9.8* 10.7* 9.8* 10.7*  --   HGB 9.8* 9.4* 9.3* 10.1* 9.7* 8.8*  HCT 31.2* 29.8* 32.0* 32.7* 32.5* 29.2*  MCV 83.4 82.5 85.8 84.9 84.6 85.9  PLT 193 193 233 241 246 099   Basic Metabolic Panel: Recent Labs  Lab 08/15/18 0407 08/16/18 0635 08/17/18 0805 08/18/18 0443 08/19/18 0545  NA 139 143 144 142 142  K 3.1* 3.3* 3.6 3.9 4.5  CL 107 112* 113* 114* 113*  CO2 21* 21* 20* 18* 16*  GLUCOSE 102* 88 89 95 91  BUN 70* 65* 57* 63* 68*  CREATININE 1.46* 1.19* 1.15* 1.16* 1.29*  CALCIUM 8.0* 8.2* 8.4* 8.5* 8.5*  MG 3.0* 3.3* 3.0*  --   --   PHOS  --   --  3.6 4.2 4.7*   GFR: Estimated Creatinine Clearance: 30.5 mL/min (A) (by C-G formula based on SCr of 1.29 mg/dL (H)). Liver Function Tests: Recent Labs  Lab 08/13/18 1926 08/14/18 8338  08/15/18 0407 08/16/18 0635 08/17/18 0805 08/18/18 0443 08/19/18 0545  AST 21 34 59* 45*  --   --   --   ALT 17 18 30 28   --   --   --   ALKPHOS 129* 130* 133* 133*  --   --   --   BILITOT 1.3* 1.4* 1.6* 1.2  --   --   --   PROT 8.4* 7.3 6.8 6.6  --   --   --   ALBUMIN 2.8* 2.5* 2.3* 2.2* 2.2* 2.2* 2.3*   Recent Labs  Lab 08/13/18 1926  LIPASE 30   No results for input(s): AMMONIA in the last 168 hours. Coagulation Profile: Recent Labs  Lab 08/13/18 1926  INR 1.6*   Cardiac Enzymes: Recent Labs  Lab 08/13/18 1926 08/14/18 0101 08/14/18 0648  TROPONINI 0.04* 0.04* 0.04*   BNP (last 3 results) No results for input(s): PROBNP in the last 8760 hours. HbA1C: No results for input(s): HGBA1C in the last 72 hours.  CBG: No results for input(s): GLUCAP in the last 168 hours. Lipid Profile: No results for input(s): CHOL, HDL, LDLCALC, TRIG, CHOLHDL, LDLDIRECT in the last 72 hours. Thyroid Function Tests: No results for input(s): TSH, T4TOTAL, FREET4, T3FREE, THYROIDAB in the last 72 hours. Anemia Panel: No results for input(s): VITAMINB12, FOLATE, FERRITIN, TIBC, IRON, RETICCTPCT in the last 72 hours. Urine analysis:    Component Value Date/Time   COLORURINE AMBER (A) 08/13/2018 1849   APPEARANCEUR CLOUDY (A) 08/13/2018 1849   LABSPEC 1.016 08/13/2018 1849   PHURINE 5.0 08/13/2018 1849   GLUCOSEU NEGATIVE 08/13/2018 1849   HGBUR MODERATE (A) 08/13/2018 1849   BILIRUBINUR NEGATIVE 08/13/2018 1849   KETONESUR NEGATIVE 08/13/2018 1849   PROTEINUR 30 (A) 08/13/2018 1849   UROBILINOGEN 0.2 07/17/2013 2240   NITRITE NEGATIVE 08/13/2018 1849   LEUKOCYTESUR TRACE (A) 08/13/2018 1849   Sepsis Labs: Invalid input(s): PROCALCITONIN, LACTICIDVEN  Recent Results (from the past 240 hour(s))  Urine culture     Status: None   Collection Time: 08/13/18  6:49 PM  Result Value Ref Range Status   Specimen Description   Final    URINE, CLEAN CATCH Performed at [facility], [address] [place], [place] [address]    Special Requests   Final    NONE Performed at [facility], [address], [place], [place] [address]    Culture   Final    NO GROWTH Performed at [facility], [place] [address], [place], [place] [address]    Report Status 08/15/2018 FINAL  Final  Culture, blood (routine x 2) Call MD if unable to obtain prior to antibiotics being given     Status: None   Collection Time: 08/13/18  9:52 PM  Result Value Ref Range Status   Specimen Description BLOOD LEFT ANTECUBITAL  Final   Special Requests   Final    BOTTLES DRAWN AEROBIC AND ANAEROBIC Blood Culture adequate volume   Culture   Final    NO GROWTH 5 DAYS Performed at [facility], [address], [place], [place] [address]    Report Status 08/18/2018 FINAL  Final  Culture, blood (routine x 2) Call MD if unable to obtain prior to antibiotics being given     Status: None   Collection Time: 08/13/18  9:52 PM  Result Value Ref Range Status   Specimen Description BLOOD RIGHT HAND  Final   Special Requests   Final    BOTTLES DRAWN AEROBIC AND ANAEROBIC Blood Culture adequate volume   Culture   Final    NO GROWTH 5 DAYS Performed at [facility], [address] [place], [place] [address]    Report Status 08/18/2018 FINAL  Final  MRSA PCR Screening     Status: Abnormal   Collection Time: 08/14/18 12:27 AM  Result Value Ref Range Status   MRSA by PCR POSITIVE (A) NEGATIVE Final    Comment:        The GeneXpert MRSA Assay (FDA approved for NASAL specimens only), is one component of a comprehensive MRSA colonization surveillance program. It is not intended to diagnose MRSA infection nor to guide or monitor treatment for MRSA infections. RESULT CALLED TO, READ BACK BY AND VERIFIED WITH: H TETRUALT,RN @0400  08/14/18 [facility] Performed at [facility], [address] [place],  [address]       Radiology Studies: Ct Abdomen Pelvis W Contrast  Result Date:  08/19/2018 CLINICAL DATA:  Abdominal pain, nasogastric tube placement EXAM: CT ABDOMEN AND PELVIS  WITH CONTRAST TECHNIQUE: Multidetector CT imaging of the abdomen and pelvis was performed using the standard protocol following bolus administration of intravenous contrast. CONTRAST:  42mL OMNIPAQUE IOHEXOL 300 MG/ML  SOLN COMPARISON:  08/13/2018 FINDINGS: Lower chest: Small right pleural effusion with some dependent atelectasis/consolidation in the posterior right lower lobe. Transvenous pacing leads. Trace pericardial effusion. Hepatobiliary: Multiple subcentimeter partially calcified stones in the dependent aspect of the nondilated gallbladder. No liver lesion. No biliary ductal dilatation. Pancreas: Mild diffuse parenchymal atrophy without mass or ductal dilatation. Spleen: Normal in size without focal abnormality. Adrenals/Urinary Tract: 2.3 cm right lower pole renal cyst. No hydronephrosis. Normal adrenal glands. Urinary bladder is nondistended. Stomach/Bowel: Nasogastric tube extends to the gastric fundus. The stomach is incompletely distended. Multiple dilated small bowel loops with incomplete distal passage of oral contrast material. Transition point is generally localized to the right pelvis but difficult to discretely identified. Distal-most loops of ileum including TI are decompressed. The colon is decompressed. No pneumatosis. Vascular/Lymphatic: Heavy aortoiliac calcified plaque without aneurysm. Portal vein patent. No definite abdominal or pelvic adenopathy. Reproductive: Uterus unremarkable. Stable well-demarcated 3.7 cm hyperdense/partially calcified left pelvic sidewall mass. Right adnexal region is inseparable from adjacent bowel loops. Other: Trace pelvic ascites. No free air. Musculoskeletal: Stable L4 compression deformity. No acute fracture or worrisome bone lesion. IMPRESSION: 1. High-grade small bowel obstruction, with transition point localized in the right pelvis. Possible etiologies include  adhesions, appendicitis, or pelvic mass inseparable from adjacent bowel loops. 2. Small right pleural effusion. 3. Cholelithiasis. Electronically Signed   By: Lucrezia Europe M.D.   On: 08/19/2018 15:24     Marzetta Board, MD, PhD [facility]  Contact via  [email or web address]  [place] P: [phone]  F: [phone]

## 2018-08-20 NOTE — H&P (View-Only) (Signed)
Reason for Consult:sbo Referring Physician: Dr. [name] is an 78 y.o. female.  HPI: The patient is an 78 year old black female who presented to [facility] 1 week ago with abd distension. She was found to have a sbo on CT. An ng was placed. After a week she did not show any improvement and with her cardiac history she was transferred to [facility]. Repeat CT still shows sbo. She thinks she is getting better but doesn't seem to be a great historian.  Past Medical History:  Diagnosis Date  . Anemia    Hgb of 9-10  . Anemia of chronic disease    Hgb of 9-10 chronically; 06/2010: H&H-10.7/33.5, MCV-81, normal iron studies in 2010   . Arteriosclerotic cardiovascular disease (ASCVD)    Remote PTCA by patient report; LBBB; associated cardiomyopathy, presumed ischemic with EF 40-45% previously, 20% in 06/2009; h/o clinical congestive heart failure; negative stress nuclear in 2009 with inferoseptal and apical scar  . Automatic implantable cardioverter-defibrillator in situ   . Breast cancer ([name])    breast  . Chronic bronchitis ([name])   . Chronic renal disease, stage 3, moderately decreased glomerular filtration rate (GFR) between 30-59 mL/min/1.73 square meter (HCC) 02/01/2016  . Congestive heart failure (CHF) ([place])   . Elevated cholesterol   . Elevated sed rate   . GERD (gastroesophageal reflux disease)   . GI bleed   . Gout   . HOH (hard of hearing)   . Hyperlipidemia   . Hypertension   . Rheumatoid arthritis ([name])   . UTI (urinary tract infection)     Past Surgical History:  Procedure Laterality Date  . BI-VENTRICULAR IMPLANTABLE CARDIOVERTER DEFIBRILLATOR N/A 07/28/2012   Procedure: BI-VENTRICULAR IMPLANTABLE CARDIOVERTER DEFIBRILLATOR  (CRT-D);  Surgeon: Evans Lance, MD;  Location: [facility] CATH LAB;  Service: Cardiovascular;  Laterality: N/A;  . BI-VENTRICULAR IMPLANTABLE CARDIOVERTER DEFIBRILLATOR  (CRT-D)  07/28/2012  . BREAST BIOPSY Bilateral   . CATARACT EXTRACTION W/  INTRAOCULAR LENS IMPLANT Left   . CATARACT EXTRACTION W/PHACO Right 09/15/2013   Procedure: CATARACT EXTRACTION PHACO AND INTRAOCULAR LENS PLACEMENT (IOC);  Surgeon: Elta Guadeloupe T. Gershon Crane, MD;  Location: [facility] ORS;  Service: Ophthalmology;  Laterality: Right;  CDE:  10.74  . COLONOSCOPY N/A 03/04/2015   Procedure: COLONOSCOPY;  Surgeon: Rogene Houston, MD;  Location: [facility] ENDO SUITE;  Service: Endoscopy;  Laterality: N/A;  10:50   . ESOPHAGOGASTRODUODENOSCOPY N/A 03/04/2015   Procedure: ESOPHAGOGASTRODUODENOSCOPY (EGD);  Surgeon: Rogene Houston, MD;  Location: [facility] END[facility] SU[facility];  Service: Endoscopy;  Laterality: N/A;  . ESOPHAGOGASTRODUODENOSCOPY N/A 09/21/2016   Procedure: ESOPHAGOGASTRODUODENOSCOPY (EGD);  Surgeon: Rogene Houston, MD;  Location: [facility] END[facility];  Service: Endoscopy;  Laterality: N/A;  . GIVENS CAPSULE STUDY N/A 09/22/2016   Procedure: GIVENS CAPSULE STUDY;  Surgeon: Rogene Houston, MD;  Location: [facility];  Service: Endoscopy;  Laterality: N/A;  . MASTECTOMY Left 1998  . TENDON TRANSFER Right 08/15/2017   Procedure: TENDON TRANSFER RIGHT WRIST EXTENSORS AS NEEDED, ULNA RESECTION AND STABILIZATION;  Surgeon: Charlotte Crumb, MD;  Location: [place];  Service: Orthopedics;  Laterality: Right;  . TOTAL KNEE ARTHROPLASTY Right    Dr.Harrison  . TUBAL LIGATION      Family History  Problem Relation Age of Onset  . Diabetes Father   . Pancreatic cancer Father   . Hypertension Brother     Social History:  reports that she quit smoking about 11 years ago. Her smoking use included cigarettes. She  started smoking about 61 years ago. She has a 7.50 pack-year smoking history. Her smokeless tobacco use includes chew. She reports that she does not drink alcohol or use drugs.  Allergies:  Allergies  Allergen Reactions  . Macrobid [Nitrofurantoin Macrocrystal] Hives and Itching    Medications: I have reviewed the patient's current medications.  Results for orders placed or performed during  the hospital encounter of 08/13/18 (from the past 48 hour(s))  Renal function panel     Status: Abnormal   Collection Time: 08/19/18  5:45 AM  Result Value Ref Range   Sodium 142 135 - 145 mmol/L   Potassium 4.5 3.5 - 5.1 mmol/L   Chloride 113 (H) 98 - 111 mmol/L   CO2 16 (L) 22 - 32 mmol/L   Glucose, Bld 91 70 - 99 mg/dL   BUN 68 (H) 8 - 23 mg/dL   Creatinine, Ser 1.29 (H) 0.44 - 1.00 mg/dL   Calcium 8.5 (L) 8.9 - 10.3 mg/dL   Phosphorus 4.7 (H) 2.5 - 4.6 mg/dL   Albumin 2.3 (L) 3.5 - 5.0 g/dL   GFR calc non Af Amer 39 (L) >60 mL/min   GFR calc Af Amer 46 (L) >60 mL/min   Anion gap 13 5 - 15    Comment: Performed at [facility], [address], [place], [place] [address]  CBC     Status: Abnormal   Collection Time: 08/19/18  5:45 AM  Result Value Ref Range   WBC 17.5 (H) 4.0 - 10.5 K/uL   RBC 3.40 (L) 3.87 - 5.11 MIL/uL   Hemoglobin 8.8 (L) 12.0 - 15.0 g/dL   HCT 29.2 (L) 36.0 - 46.0 %   MCV 85.9 80.0 - 100.0 fL   MCH 25.9 (L) 26.0 - 34.0 pg   MCHC 30.1 30.0 - 36.0 g/dL   RDW 21.2 (H) 11.5 - 15.5 %   Platelets 275 150 - 400 K/uL   nRBC 0.6 (H) 0.0 - 0.2 %    Comment: Performed at [facility], [address] [place], [place] [address]    Ct Abdomen Pelvis W Contrast  Result Date: 08/19/2018 CLINICAL DATA:  Abdominal pain, nasogastric tube placement EXAM: CT ABDOMEN AND PELVIS WITH CONTRAST TECHNIQUE: Multidetector CT imaging of the abdomen and pelvis was performed using the standard protocol following bolus administration of intravenous contrast. CONTRAST:  30mL OMNIPAQUE IOHEXOL 300 MG/ML  SOLN COMPARISON:  08/13/2018 FINDINGS: Lower chest: Small right pleural effusion with some dependent atelectasis/consolidation in the posterior right lower lobe. Transvenous pacing leads. Trace pericardial effusion. Hepatobiliary: Multiple subcentimeter partially calcified stones in the dependent aspect of the nondilated gallbladder. No liver lesion. No biliary ductal dilatation. Pancreas:  Mild diffuse parenchymal atrophy without mass or ductal dilatation. Spleen: Normal in size without focal abnormality. Adrenals/Urinary Tract: 2.3 cm right lower pole renal cyst. No hydronephrosis. Normal adrenal glands. Urinary bladder is nondistended. Stomach/Bowel: Nasogastric tube extends to the gastric fundus. The stomach is incompletely distended. Multiple dilated small bowel loops with incomplete distal passage of oral contrast material. Transition point is generally localized to the right pelvis but difficult to discretely identified. Distal-most loops of ileum including TI are decompressed. The colon is decompressed. No pneumatosis. Vascular/Lymphatic: Heavy aortoiliac calcified plaque without aneurysm. Portal vein patent. No definite abdominal or pelvic adenopathy. Reproductive: Uterus unremarkable. Stable well-demarcated 3.7 cm hyperdense/partially calcified left pelvic sidewall mass. Right adnexal region is inseparable from adjacent bowel loops. Other: Trace pelvic ascites. No free air. Musculoskeletal: Stable L4 compression deformity. No acute  fracture or worrisome bone lesion. IMPRESSION: 1. High-grade small bowel obstruction, with transition point localized in the right pelvis. Possible etiologies include adhesions, appendicitis, or pelvic mass inseparable from adjacent bowel loops. 2. Small right pleural effusion. 3. Cholelithiasis. Electronically Signed   By: Lucrezia Europe M.D.   On: 08/19/2018 15:24    Review of Systems  Constitutional: Negative.   HENT: Negative.   Eyes: Negative.   Respiratory: Negative.   Cardiovascular: Negative.   Gastrointestinal: Negative.   Genitourinary: Negative.   Musculoskeletal: Negative.   Skin: Negative.   Neurological: Negative.   Endo/Heme/Allergies: Negative.   Psychiatric/Behavioral: Negative.    Blood pressure 120/63, pulse 87, temperature 98.4 F (36.9 C), temperature source Oral, resp. rate 16, height 5\' 4"  (1.626 m), weight 64.8 kg, SpO2 98  %. Physical Exam  Constitutional: She is oriented to person, place, and time. She appears well-developed and well-nourished. No distress.  HENT:  Head: Normocephalic and atraumatic.  Mouth/Throat: No oropharyngeal exudate.  Eyes: Pupils are equal, round, and reactive to light. Conjunctivae and EOM are normal.  Neck: Normal range of motion. Neck supple.  Cardiovascular: Normal rate, regular rhythm and normal heart sounds.  Respiratory: Effort normal and breath sounds normal. No stridor. No respiratory distress.  GI: Soft. She exhibits distension. There is no abdominal tenderness. There is no guarding.  Musculoskeletal: Normal range of motion.        General: No tenderness or edema.  Neurological: She is alert and oriented to person, place, and time. Coordination normal.  Skin: Skin is warm and dry. No erythema.  Psychiatric: She has a normal mood and affect. Her behavior is normal.  Seems disoriented to situation but pleasant    Assessment/Plan: The patient appears to have a persistent sbo. She got oral contrast yesterday so we will repeat abd xrays today. We are waiting for cardiology to see since she has an AICD. Will plan for ex lap tomorrow am. Will talk to family  Autumn Messing III 08/20/2018, 9:58 AM

## 2018-08-21 ENCOUNTER — Inpatient Hospital Stay: Payer: Self-pay

## 2018-08-21 ENCOUNTER — Encounter (HOSPITAL_COMMUNITY)
Admission: EM | Disposition: A | Discharge status: Nursing Home (Includes ICF) | Payer: Self-pay | Source: Home / Self Care | Attending: Internal Medicine

## 2018-08-21 ENCOUNTER — Inpatient Hospital Stay (HOSPITAL_COMMUNITY): Payer: Medicare Other

## 2018-08-21 ENCOUNTER — Encounter (HOSPITAL_COMMUNITY): Payer: Self-pay | Admitting: Certified Registered Nurse Anesthetist

## 2018-08-21 ENCOUNTER — Inpatient Hospital Stay (HOSPITAL_COMMUNITY): Payer: Medicare Other | Admitting: Anesthesiology

## 2018-08-21 DIAGNOSIS — N183 Chronic kidney disease, stage 3 (moderate): Secondary | ICD-10-CM

## 2018-08-21 DIAGNOSIS — R000 Tachycardia, unspecified: Secondary | ICD-10-CM

## 2018-08-21 DIAGNOSIS — I472 Ventricular tachycardia: Secondary | ICD-10-CM

## 2018-08-21 DIAGNOSIS — Z9911 Dependence on respirator [ventilator] status: Secondary | ICD-10-CM

## 2018-08-21 DIAGNOSIS — I420 Dilated cardiomyopathy: Secondary | ICD-10-CM

## 2018-08-21 DIAGNOSIS — N179 Acute kidney failure, unspecified: Secondary | ICD-10-CM

## 2018-08-21 DIAGNOSIS — R34 Anuria and oliguria: Secondary | ICD-10-CM

## 2018-08-21 DIAGNOSIS — I959 Hypotension, unspecified: Secondary | ICD-10-CM

## 2018-08-21 HISTORY — PX: LAPAROTOMY: SHX154

## 2018-08-21 HISTORY — PX: BOWEL RESECTION: SHX1257

## 2018-08-21 LAB — COOXEMETRY PANEL
Carboxyhemoglobin: 1.4 % (ref 0.5–1.5)
Methemoglobin: 1.1 % (ref 0.0–1.5)
O2 Saturation: 79.1 %
Total hemoglobin: 10.9 g/dL — ABNORMAL LOW (ref 12.0–16.0)

## 2018-08-21 LAB — BLOOD GAS, ARTERIAL
Acid-base deficit: 10.1 mmol/L — ABNORMAL HIGH (ref 0.0–2.0)
Bicarbonate: 14 mmol/L — ABNORMAL LOW (ref 20.0–28.0)
Drawn by: 252031
FIO2: 50
MECHVT: 430 mL
O2 Saturation: 97.4 %
PEEP: 5 cmH2O
Patient temperature: 98.6
RATE: 16 resp/min
pCO2 arterial: 24.4 mmHg — ABNORMAL LOW (ref 32.0–48.0)
pH, Arterial: 7.377 (ref 7.350–7.450)
pO2, Arterial: 107 mmHg (ref 83.0–108.0)

## 2018-08-21 LAB — POCT I-STAT 7, (LYTES, BLD GAS, ICA,H+H)
Acid-base deficit: 9 mmol/L — ABNORMAL HIGH (ref 0.0–2.0)
Bicarbonate: 16.9 mmol/L — ABNORMAL LOW (ref 20.0–28.0)
Calcium, Ion: 1.23 mmol/L (ref 1.15–1.40)
HCT: 40 % (ref 36.0–46.0)
Hemoglobin: 13.6 g/dL (ref 12.0–15.0)
O2 Saturation: 99 %
Patient temperature: 97.7
Potassium: 4.2 mmol/L (ref 3.5–5.1)
Sodium: 150 mmol/L — ABNORMAL HIGH (ref 135–145)
TCO2: 18 mmol/L — ABNORMAL LOW (ref 22–32)
pCO2 arterial: 34.5 mmHg (ref 32.0–48.0)
pH, Arterial: 7.295 — ABNORMAL LOW (ref 7.350–7.450)
pO2, Arterial: 154 mmHg — ABNORMAL HIGH (ref 83.0–108.0)

## 2018-08-21 LAB — LACTIC ACID, PLASMA
Lactic Acid, Venous: 3.8 mmol/L (ref 0.5–1.9)
Lactic Acid, Venous: 4.8 mmol/L (ref 0.5–1.9)

## 2018-08-21 LAB — POCT I-STAT 4, (NA,K, GLUC, HGB,HCT)
Glucose, Bld: 74 mg/dL (ref 70–99)
HCT: 23 % — ABNORMAL LOW (ref 36.0–46.0)
Hemoglobin: 7.8 g/dL — ABNORMAL LOW (ref 12.0–15.0)
Potassium: 3.7 mmol/L (ref 3.5–5.1)
Sodium: 150 mmol/L — ABNORMAL HIGH (ref 135–145)

## 2018-08-21 LAB — COMPREHENSIVE METABOLIC PANEL
ALT: 16 U/L (ref 0–44)
AST: 32 U/L (ref 15–41)
Albumin: 2.2 g/dL — ABNORMAL LOW (ref 3.5–5.0)
Alkaline Phosphatase: 88 U/L (ref 38–126)
Anion gap: 9 (ref 5–15)
BUN: 42 mg/dL — ABNORMAL HIGH (ref 8–23)
CO2: 16 mmol/L — ABNORMAL LOW (ref 22–32)
Calcium: 8.1 mg/dL — ABNORMAL LOW (ref 8.9–10.3)
Chloride: 122 mmol/L — ABNORMAL HIGH (ref 98–111)
Creatinine, Ser: 1.26 mg/dL — ABNORMAL HIGH (ref 0.44–1.00)
GFR calc Af Amer: 47 mL/min — ABNORMAL LOW (ref >60)
GFR calc non Af Amer: 40 mL/min — ABNORMAL LOW (ref >60)
Glucose, Bld: 90 mg/dL (ref 70–99)
Potassium: 4.5 mmol/L (ref 3.5–5.1)
Sodium: 147 mmol/L — ABNORMAL HIGH (ref 135–145)
Total Bilirubin: 1.8 mg/dL — ABNORMAL HIGH (ref 0.3–1.2)
Total Protein: 5.1 g/dL — ABNORMAL LOW (ref 6.5–8.1)

## 2018-08-21 LAB — CBC
HCT: 29 % — ABNORMAL LOW (ref 36.0–46.0)
HCT: 41.6 % (ref 36.0–46.0)
HCT: 36.1 % (ref 36.0–46.0)
Hemoglobin: 9 g/dL — ABNORMAL LOW (ref 12.0–15.0)
Hemoglobin: 13.2 g/dL (ref 12.0–15.0)
Hemoglobin: 11.7 g/dL — ABNORMAL LOW (ref 12.0–15.0)
MCH: 25.6 pg — ABNORMAL LOW (ref 26.0–34.0)
MCH: 27.6 pg (ref 26.0–34.0)
MCH: 27.7 pg (ref 26.0–34.0)
MCHC: 31 g/dL (ref 30.0–36.0)
MCHC: 31.7 g/dL (ref 30.0–36.0)
MCHC: 32.4 g/dL (ref 30.0–36.0)
MCV: 82.6 fL (ref 80.0–100.0)
MCV: 87 fL (ref 80.0–100.0)
MCV: 85.5 fL (ref 80.0–100.0)
Platelets: 217 10*3/uL (ref 150–400)
Platelets: 187 10*3/uL (ref 150–400)
Platelets: 136 10*3/uL — ABNORMAL LOW (ref 150–400)
RBC: 3.51 MIL/uL — ABNORMAL LOW (ref 3.87–5.11)
RBC: 4.78 MIL/uL (ref 3.87–5.11)
RBC: 4.22 MIL/uL (ref 3.87–5.11)
RDW: 20.8 % — ABNORMAL HIGH (ref 11.5–15.5)
RDW: 18.2 % — ABNORMAL HIGH (ref 11.5–15.5)
RDW: 18.6 % — ABNORMAL HIGH (ref 11.5–15.5)
WBC: 17.1 10*3/uL — ABNORMAL HIGH (ref 4.0–10.5)
WBC: 16.9 10*3/uL — ABNORMAL HIGH (ref 4.0–10.5)
WBC: 49.3 10*3/uL — ABNORMAL HIGH (ref 4.0–10.5)
nRBC: 0.2 % (ref 0.0–0.2)
nRBC: 0.8 % — ABNORMAL HIGH (ref 0.0–0.2)
nRBC: 0.6 % — ABNORMAL HIGH (ref 0.0–0.2)

## 2018-08-21 LAB — BASIC METABOLIC PANEL
Anion gap: 14 (ref 5–15)
Anion gap: 14 (ref 5–15)
BUN: 41 mg/dL — ABNORMAL HIGH (ref 8–23)
BUN: 45 mg/dL — ABNORMAL HIGH (ref 8–23)
CO2: 15 mmol/L — ABNORMAL LOW (ref 22–32)
CO2: 15 mmol/L — ABNORMAL LOW (ref 22–32)
Calcium: 8.7 mg/dL — ABNORMAL LOW (ref 8.9–10.3)
Calcium: 7.5 mg/dL — ABNORMAL LOW (ref 8.9–10.3)
Chloride: 117 mmol/L — ABNORMAL HIGH (ref 98–111)
Chloride: 120 mmol/L — ABNORMAL HIGH (ref 98–111)
Creatinine, Ser: 1.01 mg/dL — ABNORMAL HIGH (ref 0.44–1.00)
Creatinine, Ser: 1.89 mg/dL — ABNORMAL HIGH (ref 0.44–1.00)
GFR calc Af Amer: >60 mL/min (ref >60)
GFR calc Af Amer: 29 mL/min — ABNORMAL LOW (ref >60)
GFR calc non Af Amer: 53 mL/min — ABNORMAL LOW (ref >60)
GFR calc non Af Amer: 25 mL/min — ABNORMAL LOW (ref >60)
Glucose, Bld: 67 mg/dL — ABNORMAL LOW (ref 70–99)
Glucose, Bld: 90 mg/dL (ref 70–99)
Potassium: 3.9 mmol/L (ref 3.5–5.1)
Potassium: 5 mmol/L (ref 3.5–5.1)
Sodium: 146 mmol/L — ABNORMAL HIGH (ref 135–145)
Sodium: 149 mmol/L — ABNORMAL HIGH (ref 135–145)

## 2018-08-21 LAB — TRIGLYCERIDES: Triglycerides: 69 mg/dL (ref <150)

## 2018-08-21 LAB — TROPONIN I
Troponin I: 0.05 ng/mL (ref <0.03)
Troponin I: 0.08 ng/mL (ref <0.03)

## 2018-08-21 LAB — SURGICAL PCR SCREEN
MRSA, PCR: POSITIVE — AB
Staphylococcus aureus: POSITIVE — AB

## 2018-08-21 LAB — GLUCOSE, CAPILLARY: Glucose-Capillary: 82 mg/dL (ref 70–99)

## 2018-08-21 LAB — PREPARE RBC (CROSSMATCH)

## 2018-08-21 SURGERY — LAPAROTOMY, EXPLORATORY
Anesthesia: General | Site: Abdomen

## 2018-08-21 MED ORDER — ROCURONIUM BROMIDE 10 MG/ML (PF) SYRINGE
PREFILLED_SYRINGE | INTRAVENOUS | Status: DC | PRN
  Administered 2018-08-21: 60 mg via INTRAVENOUS

## 2018-08-21 MED ORDER — LIDOCAINE 2% (20 MG/ML) 5 ML SYRINGE
INTRAMUSCULAR | Status: DC | PRN
  Administered 2018-08-21: 100 mg via INTRAVENOUS

## 2018-08-21 MED ORDER — ONDANSETRON 4 MG PO TBDP: 4.0000 mg | ORAL_TABLET | Freq: Four times a day (QID) | ORAL | Status: DC | PRN

## 2018-08-21 MED ORDER — VASOPRESSIN 20 UNIT/ML IV SOLN
0.0300 [IU]/min | INTRAVENOUS | Status: DC
  Administered 2018-08-21 – 2018-08-23 (×3): 0.03 [IU]/min via INTRAVENOUS
  Filled 2018-08-21 (×3): qty 2

## 2018-08-21 MED ORDER — AMIODARONE HCL IN DEXTROSE 360-4.14 MG/200ML-% IV SOLN
60.0000 mg/h | INTRAVENOUS | Status: AC
  Administered 2018-08-21 (×2): 60 mg/h via INTRAVENOUS

## 2018-08-21 MED ORDER — FENTANYL CITRATE (PF) 250 MCG/5ML IJ SOLN
INTRAMUSCULAR | Status: DC | PRN
  Administered 2018-08-21 (×2): 50 ug via INTRAVENOUS
  Administered 2018-08-21: 100 ug via INTRAVENOUS
  Administered 2018-08-21 (×2): 50 ug via INTRAVENOUS

## 2018-08-21 MED ORDER — ETOMIDATE 2 MG/ML IV SOLN
INTRAVENOUS | Status: DC | PRN
  Administered 2018-08-21: 12 mg via INTRAVENOUS

## 2018-08-21 MED ORDER — ENOXAPARIN SODIUM 30 MG/0.3ML ~~LOC~~ SOLN
30.0000 mg | SUBCUTANEOUS | Status: DC
  Administered 2018-08-22 – 2018-08-23 (×2): 30 mg via SUBCUTANEOUS
  Filled 2018-08-21 (×2): qty 0.3

## 2018-08-21 MED ORDER — VASOPRESSIN 20 UNIT/ML IV SOLN
INTRAVENOUS | Status: AC
  Filled 2018-08-21: qty 1

## 2018-08-21 MED ORDER — LACTATED RINGERS IV SOLN
INTRAVENOUS | Status: DC
  Administered 2018-08-21: 14:00:00 via INTRAVENOUS

## 2018-08-21 MED ORDER — VASOPRESSIN 20 UNIT/ML IV SOLN
0.0300 [IU]/min | INTRAVENOUS | Status: DC
  Filled 2018-08-21: qty 2

## 2018-08-21 MED ORDER — PROPOFOL 10 MG/ML IV BOLUS
INTRAVENOUS | Status: AC
  Filled 2018-08-21: qty 20

## 2018-08-21 MED ORDER — ALBUMIN HUMAN 5 % IV SOLN
INTRAVENOUS | Status: DC | PRN
  Administered 2018-08-21 (×2): via INTRAVENOUS

## 2018-08-21 MED ORDER — FENTANYL CITRATE (PF) 100 MCG/2ML IJ SOLN
25.0000 ug | INTRAMUSCULAR | Status: DC | PRN
  Administered 2018-08-21: 16:00:00 50 ug via INTRAVENOUS
  Administered 2018-08-21 (×2): 25 ug via INTRAVENOUS
  Filled 2018-08-21 (×3): qty 2

## 2018-08-21 MED ORDER — SUCCINYLCHOLINE CHLORIDE 200 MG/10ML IV SOSY
PREFILLED_SYRINGE | INTRAVENOUS | Status: DC | PRN
  Administered 2018-08-21: 70 mg via INTRAVENOUS

## 2018-08-21 MED ORDER — PHENYLEPHRINE HCL-NACL 10-0.9 MG/250ML-% IV SOLN
25.0000 ug/min | INTRAVENOUS | Status: DC
  Administered 2018-08-21: 75 ug/min via INTRAVENOUS
  Filled 2018-08-21: qty 250

## 2018-08-21 MED ORDER — MIDAZOLAM HCL 2 MG/2ML IJ SOLN
2.0000 mg | Freq: Once | INTRAMUSCULAR | Status: AC
  Administered 2018-08-21: 16:00:00 2 mg via INTRAVENOUS

## 2018-08-21 MED ORDER — EPINEPHRINE PF 1 MG/ML IJ SOLN: 0.5000 ug/min | INTRAVENOUS | Status: DC

## 2018-08-21 MED ORDER — DEXMEDETOMIDINE HCL IN NACL 200 MCG/50ML IV SOLN
INTRAVENOUS | Status: DC | PRN
  Administered 2018-08-21: .5 ug/kg/h via INTRAVENOUS

## 2018-08-21 MED ORDER — SODIUM CHLORIDE 0.9 % IV SOLN
INTRAVENOUS | Status: DC | PRN
  Administered 2018-08-21: 10:00:00 via INTRAVENOUS

## 2018-08-21 MED ORDER — STERILE WATER FOR INJECTION IJ SOLN
2.0000 mL | Freq: Once | INTRAMUSCULAR | Status: AC
  Administered 2018-08-21: 17:00:00 2 mL via INTRAMUSCULAR

## 2018-08-21 MED ORDER — AMIODARONE HCL IN DEXTROSE 360-4.14 MG/200ML-% IV SOLN
INTRAVENOUS | Status: AC
  Administered 2018-08-21: 150 mg via INTRAVENOUS
  Filled 2018-08-21: qty 400

## 2018-08-21 MED ORDER — FENTANYL CITRATE (PF) 250 MCG/5ML IJ SOLN
INTRAMUSCULAR | Status: AC
  Filled 2018-08-21: qty 5

## 2018-08-21 MED ORDER — STERILE WATER FOR INJECTION IJ SOLN
INTRAMUSCULAR | Status: AC
  Administered 2018-08-21: 17:00:00 2 mL via INTRAMUSCULAR
  Filled 2018-08-21: qty 10

## 2018-08-21 MED ORDER — KCL IN DEXTROSE-NACL 20-5-0.9 MEQ/L-%-% IV SOLN
INTRAVENOUS | Status: DC
  Filled 2018-08-21: qty 1000

## 2018-08-21 MED ORDER — VASOPRESSIN 20 UNIT/ML IV SOLN
INTRAVENOUS | Status: DC | PRN
  Administered 2018-08-21: 2 [IU] via INTRAVENOUS
  Administered 2018-08-21: 1 [IU] via INTRAVENOUS

## 2018-08-21 MED ORDER — DEXMEDETOMIDINE HCL IN NACL 200 MCG/50ML IV SOLN
INTRAVENOUS | Status: AC
  Filled 2018-08-21: qty 50

## 2018-08-21 MED ORDER — MIDAZOLAM HCL 2 MG/2ML IJ SOLN
1.0000 mg | Freq: Once | INTRAMUSCULAR | Status: AC
  Administered 2018-08-21: 17:00:00 1 mg via INTRAVENOUS

## 2018-08-21 MED ORDER — MIDAZOLAM 50MG/50ML (1MG/ML) PREMIX INFUSION
0.5000 mg/h | INTRAVENOUS | Status: DC
  Administered 2018-08-21: 22:00:00 1 mg/h via INTRAVENOUS
  Filled 2018-08-21: qty 50

## 2018-08-21 MED ORDER — AMIODARONE HCL IN DEXTROSE 360-4.14 MG/200ML-% IV SOLN
30.0000 mg/h | INTRAVENOUS | Status: DC
  Administered 2018-08-22 – 2018-08-28 (×15): 30 mg/h via INTRAVENOUS
  Filled 2018-08-21 (×15): qty 200

## 2018-08-21 MED ORDER — NOREPINEPHRINE 4 MG/250ML-% IV SOLN
2.0000 ug/min | INTRAVENOUS | Status: DC
  Administered 2018-08-21 (×2): 10 ug/min via INTRAVENOUS
  Filled 2018-08-21 (×3): qty 250

## 2018-08-21 MED ORDER — SODIUM CHLORIDE 0.9 % IV SOLN: 250.0000 mL | INTRAVENOUS | Status: DC

## 2018-08-21 MED ORDER — MIDAZOLAM HCL 2 MG/2ML IJ SOLN
INTRAMUSCULAR | Status: AC
  Administered 2018-08-21: 17:00:00 1 mg via INTRAVENOUS
  Filled 2018-08-21: qty 2

## 2018-08-21 MED ORDER — PROPOFOL 1000 MG/100ML IV EMUL: 5.0000 ug/kg/min | INTRAVENOUS | Status: DC

## 2018-08-21 MED ORDER — FENTANYL BOLUS VIA INFUSION
25.0000 ug | INTRAVENOUS | Status: DC | PRN
  Administered 2018-08-22 (×2): 25 ug via INTRAVENOUS
  Filled 2018-08-21: qty 25

## 2018-08-21 MED ORDER — DEXMEDETOMIDINE HCL IN NACL 200 MCG/50ML IV SOLN
0.0000 ug/kg/h | INTRAVENOUS | Status: DC
  Administered 2018-08-21: 19:00:00 0.5 ug/kg/h via INTRAVENOUS
  Administered 2018-08-21: 13:00:00 0.4 ug/kg/h via INTRAVENOUS
  Filled 2018-08-21 (×2): qty 50

## 2018-08-21 MED ORDER — FENTANYL CITRATE (PF) 100 MCG/2ML IJ SOLN: 25.0000 ug | Freq: Once | INTRAMUSCULAR | Status: DC

## 2018-08-21 MED ORDER — MORPHINE SULFATE (PF) 2 MG/ML IV SOLN: 1.0000 mg | INTRAVENOUS | Status: DC | PRN

## 2018-08-21 MED ORDER — AMIODARONE LOAD VIA INFUSION
150.0000 mg | Freq: Once | INTRAVENOUS | Status: AC
  Administered 2018-08-21: 18:00:00 150 mg via INTRAVENOUS
  Filled 2018-08-21: qty 83.34

## 2018-08-21 MED ORDER — ONDANSETRON HCL 4 MG/2ML IJ SOLN: 4.0000 mg | Freq: Four times a day (QID) | INTRAMUSCULAR | Status: DC | PRN

## 2018-08-21 MED ORDER — EPINEPHRINE PF 1 MG/ML IJ SOLN
0.5000 ug/min | INTRAVENOUS | Status: DC
  Filled 2018-08-21 (×3): qty 4

## 2018-08-21 MED ORDER — FENTANYL 2500MCG IN NS 250ML (10MCG/ML) PREMIX INFUSION
25.0000 ug/h | INTRAVENOUS | Status: DC
  Administered 2018-08-21: 23:00:00 50 ug/h via INTRAVENOUS
  Filled 2018-08-21: qty 250

## 2018-08-21 MED ORDER — MIDAZOLAM HCL 2 MG/2ML IJ SOLN
2.0000 mg | Freq: Once | INTRAMUSCULAR | Status: AC
  Administered 2018-08-21: 18:00:00 2 mg via INTRAVENOUS

## 2018-08-21 MED ORDER — PANTOPRAZOLE SODIUM 40 MG IV SOLR
40.0000 mg | Freq: Every day | INTRAVENOUS | Status: DC
  Administered 2018-08-21 – 2018-09-13 (×24): 40 mg via INTRAVENOUS
  Filled 2018-08-21 (×24): qty 40

## 2018-08-21 MED ORDER — PIPERACILLIN-TAZOBACTAM 3.375 G IVPB
3.3750 g | Freq: Three times a day (TID) | INTRAVENOUS | Status: DC
  Administered 2018-08-21 – 2018-08-23 (×6): 3.375 g via INTRAVENOUS
  Filled 2018-08-21 (×5): qty 50

## 2018-08-21 MED ORDER — 0.9 % SODIUM CHLORIDE (POUR BTL) OPTIME
TOPICAL | Status: DC | PRN
  Administered 2018-08-21: 2000 mL

## 2018-08-21 MED ORDER — DIGOXIN 0.25 MG/ML IJ SOLN: 0.2500 mg | Freq: Once | INTRAMUSCULAR | Status: DC

## 2018-08-21 MED ORDER — LACTATED RINGERS IV BOLUS: 500.0000 mL | Freq: Once | INTRAVENOUS | Status: DC

## 2018-08-21 MED ORDER — ORAL CARE MOUTH RINSE
15.0000 mL | OROMUCOSAL | Status: DC
  Administered 2018-08-21 – 2018-08-24 (×30): 15 mL via OROMUCOSAL

## 2018-08-21 MED ORDER — MIDAZOLAM HCL 2 MG/2ML IJ SOLN
INTRAMUSCULAR | Status: AC
  Administered 2018-08-21: 18:00:00 2 mg via INTRAVENOUS
  Filled 2018-08-21: qty 2

## 2018-08-21 MED ORDER — PHENYLEPHRINE 40 MCG/ML (10ML) SYRINGE FOR IV PUSH (FOR BLOOD PRESSURE SUPPORT)
PREFILLED_SYRINGE | INTRAVENOUS | Status: DC | PRN
  Administered 2018-08-21: 80 ug via INTRAVENOUS
  Administered 2018-08-21 (×2): 120 ug via INTRAVENOUS
  Administered 2018-08-21: 40 ug via INTRAVENOUS

## 2018-08-21 MED ORDER — NOREPINEPHRINE 16 MG/250ML-% IV SOLN
0.0000 ug/min | INTRAVENOUS | Status: DC
  Administered 2018-08-21: 21:00:00 40 ug/min via INTRAVENOUS
  Administered 2018-08-22 (×2): 60 ug/min via INTRAVENOUS
  Filled 2018-08-21 (×3): qty 250

## 2018-08-21 MED ORDER — SODIUM BICARBONATE 8.4 % IV SOLN
50.0000 meq | Freq: Once | INTRAVENOUS | Status: AC
  Administered 2018-08-21: 50 meq via INTRAVENOUS
  Filled 2018-08-21: qty 50

## 2018-08-21 MED ORDER — MIDAZOLAM HCL 2 MG/2ML IJ SOLN
INTRAMUSCULAR | Status: AC
  Administered 2018-08-21: 16:00:00 2 mg via INTRAVENOUS
  Filled 2018-08-21: qty 2

## 2018-08-21 MED ORDER — SODIUM BICARBONATE 8.4 % IV SOLN
INTRAVENOUS | Status: AC
  Filled 2018-08-21: qty 150

## 2018-08-21 MED ORDER — PROPOFOL 1000 MG/100ML IV EMUL
5.0000 ug/kg/min | INTRAVENOUS | Status: DC
  Administered 2018-08-21: 40 ug/kg/min via INTRAVENOUS
  Filled 2018-08-21: qty 100

## 2018-08-21 MED ORDER — CHLORHEXIDINE GLUCONATE 0.12% ORAL RINSE (MEDLINE KIT)
15.0000 mL | Freq: Two times a day (BID) | OROMUCOSAL | Status: DC
  Administered 2018-08-21 – 2018-08-24 (×8): 15 mL via OROMUCOSAL

## 2018-08-21 MED ORDER — CEFAZOLIN SODIUM-DEXTROSE 2-3 GM-%(50ML) IV SOLR
INTRAVENOUS | Status: DC | PRN
  Administered 2018-08-21: 2 g via INTRAVENOUS

## 2018-08-21 MED ORDER — PHENTOLAMINE MESYLATE 5 MG IJ SOLR
10.0000 mg | INTRAMUSCULAR | Status: AC
  Administered 2018-08-21: 10 mg via SUBCUTANEOUS
  Filled 2018-08-21 (×3): qty 10

## 2018-08-21 MED ORDER — LACTATED RINGERS IV SOLN
INTRAVENOUS | Status: DC | PRN
  Administered 2018-08-21: 08:00:00 via INTRAVENOUS

## 2018-08-21 MED ORDER — NOREPINEPHRINE 4 MG/250ML-% IV SOLN
INTRAVENOUS | Status: AC
  Administered 2018-08-21: 15:00:00 10 ug/min via INTRAVENOUS
  Filled 2018-08-21: qty 250

## 2018-08-21 MED ORDER — SODIUM CHLORIDE 0.9 % IV SOLN
INTRAVENOUS | Status: DC | PRN
  Administered 2018-08-21: 70 ug/min via INTRAVENOUS

## 2018-08-21 MED ORDER — LACTATED RINGERS IV BOLUS
1000.0000 mL | Freq: Once | INTRAVENOUS | Status: AC
  Administered 2018-08-21: 12:00:00 1000 mL via INTRAVENOUS

## 2018-08-21 SURGICAL SUPPLY — 49 items
BLADE CLIPPER SURG (BLADE) IMPLANT
BNDG GAUZE ELAST 4 BULKY (GAUZE/BANDAGES/DRESSINGS) ×1 IMPLANT
CANISTER SUCT 3000ML PPV (MISCELLANEOUS) ×2 IMPLANT
CHLORAPREP W/TINT 26ML (MISCELLANEOUS) ×2 IMPLANT
COVER SURGICAL LIGHT HANDLE (MISCELLANEOUS) ×2 IMPLANT
COVER WAND RF STERILE (DRAPES) ×2 IMPLANT
DRAIN CHANNEL 19F RND (DRAIN) ×1 IMPLANT
DRAPE LAPAROSCOPIC ABDOMINAL (DRAPES) ×2 IMPLANT
DRAPE WARM FLUID 44X44 (DRAPE) ×2 IMPLANT
DRSG OPSITE POSTOP 4X10 (GAUZE/BANDAGES/DRESSINGS) IMPLANT
DRSG OPSITE POSTOP 4X8 (GAUZE/BANDAGES/DRESSINGS) IMPLANT
DRSG TEGADERM 4X4.75 (GAUZE/BANDAGES/DRESSINGS) ×1 IMPLANT
ELECT BLADE 4.0 EZ CLEAN MEGAD (MISCELLANEOUS) ×2
ELECT BLADE 6.5 EXT (BLADE) IMPLANT
ELECT CAUTERY BLADE 6.4 (BLADE) ×2 IMPLANT
ELECT REM PT RETURN 9FT ADLT (ELECTROSURGICAL) ×2
ELECTRODE BLDE 4.0 EZ CLN MEGD (MISCELLANEOUS) IMPLANT
ELECTRODE REM PT RTRN 9FT ADLT (ELECTROSURGICAL) ×1 IMPLANT
EVACUATOR SILICONE 100CC (DRAIN) ×1 IMPLANT
GLOVE BIO SURGEON STRL SZ7.5 (GLOVE) ×2 IMPLANT
GOWN STRL REUS W/ TWL LRG LVL3 (GOWN DISPOSABLE) ×2 IMPLANT
GOWN STRL REUS W/TWL LRG LVL3 (GOWN DISPOSABLE) ×6
KIT BASIN OR (CUSTOM PROCEDURE TRAY) ×2 IMPLANT
KIT TURNOVER KIT B (KITS) ×2 IMPLANT
LIGASURE IMPACT 36 18CM CVD LR (INSTRUMENTS) IMPLANT
NS IRRIG 1000ML POUR BTL (IV SOLUTION) ×4 IMPLANT
PACK GENERAL/GYN (CUSTOM PROCEDURE TRAY) ×2 IMPLANT
PAD ABD 8X10 STRL (GAUZE/BANDAGES/DRESSINGS) ×1 IMPLANT
PAD ARMBOARD 7.5X6 YLW CONV (MISCELLANEOUS) ×2 IMPLANT
PENCIL SMOKE EVACUATOR (MISCELLANEOUS) ×2 IMPLANT
RELOAD PROXIMATE 75MM BLUE (ENDOMECHANICALS) ×4 IMPLANT
RELOAD STAPLE 75 3.8 BLU REG (ENDOMECHANICALS) IMPLANT
SLEEVE SUCTION 125 (MISCELLANEOUS) ×1 IMPLANT
SPECIMEN JAR LARGE (MISCELLANEOUS) IMPLANT
SPONGE LAP 18X18 RF (DISPOSABLE) IMPLANT
STAPLER GUN LINEAR PROX 60 (STAPLE) ×1 IMPLANT
STAPLER PROXIMATE 75MM BLUE (STAPLE) ×1 IMPLANT
STAPLER VISISTAT 35W (STAPLE) ×2 IMPLANT
SUCTION POOLE TIP (SUCTIONS) ×2 IMPLANT
SUT ETHILON 2 0 FS 18 (SUTURE) ×1 IMPLANT
SUT PDS AB 1 TP1 96 (SUTURE) ×4 IMPLANT
SUT SILK 2 0 SH CR/8 (SUTURE) ×2 IMPLANT
SUT SILK 2 0 TIES 10X30 (SUTURE) ×2 IMPLANT
SUT SILK 3 0 SH CR/8 (SUTURE) ×2 IMPLANT
SUT SILK 3 0 TIES 10X30 (SUTURE) ×2 IMPLANT
SUT VIC AB 3-0 SH 18 (SUTURE) IMPLANT
TOWEL OR 17X26 10 PK STRL BLUE (TOWEL DISPOSABLE) ×2 IMPLANT
TRAY FOLEY MTR SLVR 16FR STAT (SET/KITS/TRAYS/PACK) ×1 IMPLANT
YANKAUER SUCT BULB TIP NO VENT (SUCTIONS) IMPLANT

## 2018-08-21 NOTE — Anesthesia Procedure Notes (Signed)
Procedure Name: Intubation Date/Time: 08/21/2018 8:21 AM Performed by: Elayne Snare, CRNA Pre-anesthesia Checklist: Patient identified, Emergency Drugs available, Suction available and Patient being monitored Patient Re-evaluated:Patient Re-evaluated prior to induction Oxygen Delivery Method: Circle System Utilized Preoxygenation: Pre-oxygenation with 100% oxygen Induction Type: IV induction, Rapid sequence and Cricoid Pressure applied Laryngoscope Size: Mac and 3 Grade View: Grade I Tube type: Oral Tube size: 7.0 mm Number of attempts: 1 Airway Equipment and Method: Stylet and Oral airway Placement Confirmation: ETT inserted through vocal cords under direct vision,  positive ETCO2 and breath sounds checked- equal and bilateral Secured at: 22 cm Tube secured with: Tape Dental Injury: Teeth and Oropharynx as per pre-operative assessment

## 2018-08-21 NOTE — CV Procedure (Signed)
Central Venous Catheter Insertion Procedure Note [name] [id number] 01/20/1941    Procedure: Insertion of Central Venous Catheter Indications: Drug and/or fluid administration   Procedure Details Consent: Risks of procedure as well as the alternatives and risks of each were explained to the (patient/caregiver).  Consent for procedure obtained. Time Out: Verified patient identification, verified procedure, site/side was marked, verified correct patient position, special equipment/implants available, medications/allergies/relevent history reviewed, required imaging and test results available.  Performed   Maximum sterile technique was used including antiseptics, cap, gloves, gown, hand hygiene, mask and sheet. Skin prep: Chlorhexidine; local anesthetic administered I initially tied to place a LIJ cath. We accessed the vessel easily but were unable to pass the wire past her ICD wires. I placed and angio cath and tried to manipulate the wire past the ICD wires but was unsuccessful. We then prepped the right neck. Using u/s guidance, an antimicrobial bonded/coated triple lumen catheter was placed in the right internal jugular vein using the Seldinger technique.   Evaluation Blood flow good Complications: No apparent complications Patient did tolerate procedure well. Chest X-ray ordered to verify placement.  CXR: looked good.    Glori Bickers, MD  5:34 PM

## 2018-08-21 NOTE — Progress Notes (Signed)
Patient personal belongings and blue duffel bag taken to room 2H17. Jewelry in pink cup in front lining of blue duffle bag.

## 2018-08-21 NOTE — Interval H&P Note (Signed)
History and Physical Interval Note:  08/21/2018 7:52 AM  [name]  has presented today for surgery, with the diagnosis of BOWEL OBSTRUCTION.  The various methods of treatment have been discussed with the patient and family. After consideration of risks, benefits and other options for treatment, the patient has consented to  Procedure(s): EXPLORATORY LAPAROTOMY WITH LYSIS OF ADHESIONS (N/A) as a surgical intervention.  The patient's history has been reviewed, patient examined, no change in status, stable for surgery.  I have reviewed the patient's chart and labs.  Questions were answered to the patient's satisfaction.     Autumn Messing III

## 2018-08-21 NOTE — Progress Notes (Signed)
eLink Physician-Brief Progress Note Patient Name: [name] DOB: 02-12-1941 MRN: [id number]   Date of Service  08/21/2018  HPI/Events of Note  Patient with a baseline EF of 30 % and now on max dose Norepinephrine with low blood pressures, my concern is that the high afterload from max Levophed is causing a cardiogenic shock spiral exacerbated by the vasopressor effect of Vasopressin and the negative inotropic effect of Diprivan.  eICU Interventions  Start low dose Epinephrine infusion and titrate gradually as indicated, wean down Levophed, substitute Versed infusion for Propofol for lower negative inotropic impact. I've asked the bedside RN to page cardiology stat for his input as this is a complicated cardiac patient.        [name] [name] 08/21/2018, 8:30 PM

## 2018-08-21 NOTE — Anesthesia Procedure Notes (Signed)
Arterial Line Insertion Start/End4/30/2020 7:35 AM Performed by: Elayne Snare, CRNA, CRNA  Lidocaine 1% used for infiltration Right, radial was placed Catheter size: 20 G Hand hygiene performed  and maximum sterile barriers used  Allen's test indicative of satisfactory collateral circulation Attempts: 2 Procedure performed without using ultrasound guided technique. Following insertion, dressing applied and Biopatch. Post procedure assessment: normal  Patient tolerated the procedure well with no immediate complications.

## 2018-08-21 NOTE — Consult Note (Addendum)
NAME:  [name], MRN:  [id number], DOB:  09-12-1940, LOS: 8 ADMISSION DATE:  08/13/2018, CONSULTATION DATE:  4/20 REFERRING MD:  Renne Crigler , CHIEF COMPLAINT:  Vent management    Brief History   78 year old female with significant cardiac history that is post exploratory laparotomy for small bowel obstruction with associated ischemic bowel on 4/30, return to the intensive care on mechanical ventilator, critical care asked to assist with care  History of present illness   78 year old female with history as mentioned below initially admitted to [facility] on 4/23 with 3-day history of abdominal pain and distention was felt to have small bowel obstruction with plan to admit and treat conservatively with bowel rest and observation.  Repeat CT scan obtained on 4/28 showed persistent small bowel obstruction, it was felt at that time she would require surgical intervention and therefore was transferred to [facility] for surgical intervention as she has significant cardiac history. She was brought to the operating room on 4/30 where she underwent exploratory laparotomy with lysis of adhesions intraoperatively was found to have ischemic changes requiring bowel resection. Received 2 units PRBC during case + crystalloid/colloid for +balance of ~ 500.  Returned to the intensive care on ventilatory support pulmonary asked to assume care.  Past Medical History  Congestive heart failure with EF 30%, nonsustained VT, pericardial effusion CAD w/ prior AICD, diastolic heart failure, CKD stage III, hypertension, rheumatoid arthritis, anemia of chronic disease.  Significant Hospital Events   4/22 Admitted to [facility] with small bowel obstruction.  Also started on antibiotics for possible PNA 4/28 ongoing high-grade bowel obstruction 4/29 arrived at [facility] 4/30 Ex-lap with lysis of adhesion, bowel resection for ischemic bowel return to ICU on ventilator.   Consults:  Radiology consulted 4/29  Procedures:   R Fem ALine 4/30 >>   Significant Diagnostic Tests:  CT imaging 4/29 >> Persistent small bowel obstruction  Micro Data:  BCx2 4/22 >> neg UC 4/22 >> neg  Antimicrobials:  Doxy 4/22 to 4/28   Interim history/subjective:  Returned to ICU post op on vent.  RN reports 576ml+ of serosanguinous fluid out of JP drain.  Neo at 40 mcg.    Objective   Blood pressure 123/61, pulse (Abnormal) 117, temperature 98.2 F (36.8 C), temperature source Oral, resp. rate 16, height 5\' 4"  (1.626 m), weight 64.8 kg, SpO2 97 %.    Vent Mode: PRVC FiO2 (%):  [60 %] 60 % Set Rate:  [16 bmp] 16 bmp Vt Set:  [430 mL] 430 mL PEEP:  [5 cmH20] 5 cmH20 Plateau Pressure:  [19 cmH20] 19 cmH20   Intake/Output Summary (Last 24 hours) at 08/21/2018 1130 Last data filed at 08/21/2018 1125 Gross per 24 hour  Intake 2893.43 ml  Output 3260 ml  Net -366.57 ml   Filed Weights   08/13/18 2000 08/14/18 0017 08/14/18 0500  Weight: 66.7 kg 64.8 kg 64.8 kg    Examination: General: elderly female lying in bed on vent, critically ill appearing  HENT: ETT, mm pink/dry Lungs: even/non-labored on vent, vent assisted breaths Cardiovascular: s1s2 regular, V-paced rhythm on monitor Abdomen: protuberant, midline dressing clean/dry, intact, JP drain with serosanguinous fluid  Extremities: cool/dry, no edema  Neuro: sedate  Resolved Hospital Problem list   RLL PNA - treated with 7 days doxycycline   Assessment & Plan:    Small Bowel Obstruction s/p Exploratory Laparotomy -initial admit 4/22 at [facility], failed conservative therapy  P: Post operative recommendations per CCS Follow JP  drainage  Follow up labs now post op > CBC, CMP, ABG, troponin, lactate   Acute Respiratory Insufficiency  -post op exploratory laparotomy  P: PRVC 8cc/kg  Wean PEEP / FiO2 for sats >90% Assess CXR post OR and in am  ABG now  Hold wean for today   Shock  -?ABL, +/- sepsis with ischemic bowel -immune suppressed on Plaquenil for RA   P: Neosynephrine for MAP >65 1L LR bolus now, then 50 ml/hr  Assess CBC now  Follow lactate   Chronic Systolic / Diastolic CHF  -LVEF 31-51%, diffuse hypokinesis 08/19/18 -AICD P: Caution with further volume  Tele monitoring  Resume low dose beta blocker this evening > avoid withdrawal  At Risk AKI, Oliguria  CKD III  P: Trend BMP / urinary output Replace electrolytes as indicated Avoid nephrotoxic agents, ensure adequate renal perfusion IVF as above   At Risk Malnutrition  P: Plan for PICC, anticipate will need TPN  RA on Plaquenil P: Hold home plaquenil   Anemia -ABL + ACD P: Trend CBC  Transfuse per ICU guidelines  Follow up H/H 2300  Best practice:  Diet: NPO  Pain/Anxiety/Delirium protocol (if indicated): PRN fentanyl + precedex  VAP protocol (if indicated): in place  DVT prophylaxis: SCD's, rescheduled lovenox for evening 5/1 if ok with CCS GI prophylaxis: PPI Glucose control: n/a Mobility: bedrest  Code Status: Full Code  Family Communication: Update per surgery.  Disposition:   Labs   CBC: Recent Labs  Lab 08/15/18 0407 08/16/18 7616 08/17/18 0805 08/18/18 0443 08/19/18 0545 08/21/18 0232 08/21/18 0916  WBC 12.3* 14.0* 13.5* 14.8* 17.5* 17.1*  --   NEUTROABS 9.8* 10.7* 9.8* 10.7*  --   --   --   HGB 9.4* 9.3* 10.1* 9.7* 8.8* 9.0* 7.8*  HCT 29.8* 32.0* 32.7* 32.5* 29.2* 29.0* 23.0*  MCV 82.5 85.8 84.9 84.6 85.9 82.6  --   PLT 193 233 241 246 275 217  --     Basic Metabolic Panel: Recent Labs  Lab 08/15/18 0407 08/16/18 0635 08/17/18 0805 08/18/18 0443 08/19/18 0545 08/20/18 1248 08/21/18 0232 08/21/18 0916  NA 139 143 144 142 142 146* 146* 150*  K 3.1* 3.3* 3.6 3.9 4.5 4.1 3.9 3.7  CL 107 112* 113* 114* 113* 115* 117*  --   CO2 21* 21* 20* 18* 16* 17* 15*  --   GLUCOSE 102* 88 89 95 91 74 67* 74  BUN 70* 65* 57* 63* 68* 52* 41*  --   CREATININE 1.46* 1.19* 1.15* 1.16* 1.29* 1.18* 1.01*  --   CALCIUM 8.0* 8.2* 8.4* 8.5* 8.5*  8.5* 8.7*  --   MG 3.0* 3.3* 3.0*  --   --  2.6*  --   --   PHOS  --   --  3.6 4.2 4.7*  --   --   --    GFR: Estimated Creatinine Clearance: 39 mL/min (A) (by C-G formula based on SCr of 1.01 mg/dL (H)). Recent Labs  Lab 08/17/18 0805 08/18/18 0443 08/19/18 0545 08/21/18 0232  WBC 13.5* 14.8* 17.5* 17.1*    Liver Function Tests: Recent Labs  Lab 08/15/18 0407 08/16/18 0635 08/17/18 0805 08/18/18 0443 08/19/18 0545  AST 59* 45*  --   --   --   ALT 30 28  --   --   --   ALKPHOS 133* 133*  --   --   --   BILITOT 1.6* 1.2  --   --   --  PROT 6.8 6.6  --   --   --   ALBUMIN 2.3* 2.2* 2.2* 2.2* 2.3*   No results for input(s): LIPASE, AMYLASE in the last 168 hours. No results for input(s): AMMONIA in the last 168 hours.  ABG No results found for: PHART, PCO2ART, PO2ART, HCO3, TCO2, ACIDBASEDEF, O2SAT   Coagulation Profile: No results for input(s): INR, PROTIME in the last 168 hours.  Cardiac Enzymes: No results for input(s): CKTOTAL, CKMB, CKMBINDEX, TROPONINI in the last 168 hours.  HbA1C: No results found for: HGBA1C  CBG: No results for input(s): GLUCAP in the last 168 hours.  Review of Systems:   Unable to complete as patient is sedate on mechanical ventilation.  Past Medical History  She,  has a past medical history of Anemia, Anemia of chronic disease, Arteriosclerotic cardiovascular disease (ASCVD), Automatic implantable cardioverter-defibrillator in situ, Breast cancer ([place]), Chronic bronchitis ([place]), Chronic renal disease, stage 3, moderately decreased glomerular filtration rate (GFR) between 30-59 mL/min/1.73 square meter ([place]) (02/01/2016), Congestive heart failure (CHF) ([place]), Elevated cholesterol, Elevated sed rate, GERD (gastroesophageal reflux disease), GI bleed, Gout, HOH (hard of hearing), Hyperlipidemia, Hypertension, Rheumatoid arthritis ([place]), and UTI (urinary tract infection).   Surgical History    Past Surgical History:  Procedure Laterality  Date   BI-VENTRICULAR IMPLANTABLE CARDIOVERTER DEFIBRILLATOR N/A 07/28/2012   Procedure: BI-VENTRICULAR IMPLANTABLE CARDIOVERTER DEFIBRILLATOR  (CRT-D);  Surgeon: Evans Lance, MD;  Location: [facility] CATH LAB;  Service: Cardiovascular;  Laterality: N/A;   BI-VENTRICULAR IMPLANTABLE CARDIOVERTER DEFIBRILLATOR  (CRT-D)  07/28/2012   BREAST BIOPSY Bilateral    CATARACT EXTRACTION W/ INTRAOCULAR LENS IMPLANT Left    CATARACT EXTRACTION W/PHACO Right 09/15/2013   Procedure: CATARACT EXTRACTION PHACO AND INTRAOCULAR LENS PLACEMENT ([place]);  Surgeon: Elta Guadeloupe T. Gershon Crane, MD;  Location: [facility] ORS;  Service: Ophthalmology;  Laterality: Right;  CDE:  10.74   COLONOSCOPY N/A 03/04/2015   Procedure: COLONOSCOPY;  Surgeon: Rogene Houston, MD;  Location: [facility] ENDO SUITE;  Service: Endoscopy;  Laterality: N/A;  10:50    ESOPHAGOGASTRODUODENOSCOPY N/A 03/04/2015   Procedure: ESOPHAGOGASTRODUODENOSCOPY (EGD);  Surgeon: Rogene Houston, MD;  Location: [facility] ENDO SU[facility];  Service: Endoscopy;  Laterality: N/A;   ESOPHAGOGASTRODUODENOSCOPY N/A 09/21/2016   Procedure: ESOPHAGOGASTRODUODENOSCOPY (EGD);  Surgeon: Rogene Houston, MD;  Location: [facility];  Service: Endoscopy;  Laterality: N/A;   GIVENS CAPSULE STUDY N/A 09/22/2016   Procedure: GIVENS CAPSULE STUDY;  Surgeon: Rogene Houston, MD;  Location: [facility];  Service: Endoscopy;  Laterality: N/A;   MASTECTOMY Left 1998   TENDON TRANSFER Right 08/15/2017   Procedure: TENDON TRANSFER RIGHT WRIST EXTENSORS AS NEEDED, ULNA RESECTION AND STABILIZATION;  Surgeon: Charlotte Crumb, MD;  Location: [place];  Service: Orthopedics;  Laterality: Right;   TOTAL KNEE ARTHROPLASTY Right    [place]   TUBAL LIGATION       Social History   reports that she quit smoking about 11 years ago. Her smoking use included cigarettes. She started smoking about 61 years ago. She has a 7.50 pack-year smoking history. Her smokeless tobacco use includes chew. She reports that she does  not drink alcohol or use drugs.   Family History   Her family history includes Diabetes in her father; Hypertension in her brother; Pancreatic cancer in her father.   Allergies Allergies  Allergen Reactions   Macrobid [Nitrofurantoin Macrocrystal] Hives and Itching     Home Medications  Prior to Admission medications   Medication Sig Start Date End Date Taking? Authorizing Provider  acetaminophen (TYLENOL) 500 MG tablet Take 500 mg by mouth every 6 (six) hours as needed (FOR PAIN/HEADACHES).   Yes [provider]  allopurinol (ZYLOPRIM) 100 MG tablet TAKE 1 TABLET (100 MG TOTAL) BY MOUTH DAILY. 12/11/16 08/13/18 Yes Deveshwar, Abel Presto, MD  carvedilol (COREG) 6.25 MG tablet TAKE 1 TABLET BY MOUTH TWICE A DAY WITH MEALS 05/16/17  Yes Herminio Commons, MD  colchicine (COLCRYS) 0.6 MG tablet Take 0.6 mg by mouth daily as needed.    Yes [provider]  furosemide (LASIX) 40 MG tablet Take 40 mg by mouth daily.  08/23/17  Yes [provider]  hydroxychloroquine (PLAQUENIL) 200 MG tablet Take 1 tablet by mouth daily Monday through Friday only 07/11/18  Yes Deveshwar, Shaili, MD  KLOR-CON M20 20 MEQ tablet Take 20 mEq by mouth daily.  01/22/18  Yes [provider]  lovastatin (MEVACOR) 40 MG tablet TAKE 1 TABLET (40 MG TOTAL) BY MOUTH AT BEDTIME. 05/16/17  Yes Herminio Commons, MD  Magnesium 500 MG CAPS Take 500 mg by mouth at bedtime.   Yes [provider]  pantoprazole (PROTONIX) 20 MG tablet Take 1 tablet (20 mg total) by mouth daily. Patient taking differently: Take 20 mg by mouth 2 (two) times daily.  11/20/16  Yes Lendon Colonel, NP     Critical care time: 104 minutes    Noe Gens, NP-C [facility] Pgr: [phone] or if no answer [phone] 08/21/2018, 12:25 PM

## 2018-08-21 NOTE — Progress Notes (Signed)
Savannah Pelley [name] a 78 y.o.femalewith a history of chronic combined systolic and diastolic CHF due to iCM with EF of 30% on Echo this admit s/p ICD, CAD with remote MI, hypertension, hyperlipidemia, CKD stage III, chronic anemia, and GERDwho underwent ex-lap for SBO on 4/30. Transferred to ICU post-op for severe hypotension and shock and required cardioversion this afternoon for wide complex tachycardia with heart rates around 140.  I was called around 8:30pm due to significantly increased pressor requirement with persistently low MAPs. I assessed the patient and found her to be mildly agitated in bed with cool distal extremities. She had no lower extremity edema and her JVP was at her clavicle when lying at 45 degrees. Her telemetry showed occasional paced beats (~1/10) with a wide complex rhythm otherwise with rates ~140-150bpm. On further review of her tele, it appears that her wide complex rhythm was the same as her prior rhythm that she was cardioverted for. Her Medtronic device was interrogated and showed atrial arrhythmia with variable ventricular conduction. Her paced ventricular beats showed significantly better perfusion on her arterial line compared with her native atrial sensed-ventricular sensed beats based on her device interrogation. Given this findings, her underlying rhythm is likely atrial fibrillation with variable conduction. A brief trial of ramp atrial pacing was tried with a brief period of atrially and ventricularly paced rhythm with significant improvement in her perfusion.  Her lower rate was increased to 90bpm to help increase the amount of paced rhythm she was having. This successfully resulted in more frequent paced beats compared to her wide, native complex with increased perfusion and decreased pressor requirement. She was bolused with an additional dose of amiodarone 150mg  and then resumed on her amiodarone drip. Her case was discussed with the online ICU team as well as the  bedside nursing team. A full set of labs were drawn which revealed mildly increased lactate and a significantly increased leukocytosis concerning for sepsis.   Recommendations: - Continue increased lower rate pacing of 90 bpm - S/p additional amiodarone 150mg  bolus  - Continue amiodarone 1mg /hr  - Avoid beta blockers and calcium channel blockers - Continue levophed for MAP >92mmHg (if additional pressors needed would favor vasopressin and then epinephrine) - Trend CVP - Ok to bolus with fluids PRN for hypotension as long as CVP <15 - Recommend fentanyl and versed for increased sedation and comfort (ordered) - Agree with broad spectrum antibiotics

## 2018-08-21 NOTE — Progress Notes (Addendum)
  Patient became increasingly unstable with wide complex tachycardia in 140-150 range.   BP dropped requiring increased pressors.   Patient persistently hypotensive.   Decision made for emergent DC-CV.   Amiodarone started.   CRITICAL CARE Performed by: Glori Bickers  Additional critical care time: 35 minutes  Critical care time was exclusive of separately billable procedures and treating other patients.  Critical care was necessary to treat or prevent imminent or life-threatening deterioration.  Critical care was time spent personally by me (independent of midlevel providers or residents) on the following activities: development of treatment plan with patient and/or surrogate as well as nursing, discussions with consultants, evaluation of patient's response to treatment, examination of patient, obtaining history from patient or surrogate, ordering and performing treatments and interventions, ordering and review of laboratory studies, ordering and review of radiographic studies, pulse oximetry and re-evaluation of patient's condition.   Glori Bickers, MD  6:21 PM

## 2018-08-21 NOTE — Progress Notes (Signed)
Patient went to the operating room prior to me evaluating her.  She has returned and now is intubated and in the ICU and appears to be critically ill.  Have consulted critical care and they will co-manage with general surgery moving forward.  Please call us back when patient comes out of the ICU but for now will not plan to continue to see patient.   Costin M. Cruzita Lederer, MD, PhD [facility] Contact via  [email or web address]  [place] P: [phone]  F: [phone]

## 2018-08-21 NOTE — Transfer of Care (Signed)
Immediate Anesthesia Transfer of Care Note  Patient: [name]  Procedure(s) Performed: EXPLORATORY LAPAROTOMY WITH LYSIS OF ADHESIONS (N/A Abdomen) Small Bowel Resection (N/A Abdomen)  Patient Location: PACU  Anesthesia Type:General  Level of Consciousness: Patient remains intubated per anesthesia plan  Airway & Oxygen Therapy: Patient remains intubated per anesthesia plan and Patient placed on Ventilator (see vital sign flow sheet for setting)  Post-op Assessment: Report given to RN and Post -op Vital signs reviewed and stable  Post vital signs: Reviewed and stable  Last Vitals:  Vitals Value Taken Time  BP    Temp    Pulse    Resp 36 08/21/2018 10:22 AM  SpO2    Vitals shown include unvalidated device data.  Last Pain:  Vitals:   08/21/18 0703  TempSrc:   PainSc: 0-No pain      Patients Stated Pain Goal: 2 (00/51/10 2111)  Complications: No apparent anesthesia complications

## 2018-08-21 NOTE — Progress Notes (Signed)
CRITICAL VALUE ALERT  Critical Value:  Trop  Date & Time Notied:  08/21/2018  Provider Notified: NP Alfredo Martinez  Orders Received/Actions taken: no further orders

## 2018-08-21 NOTE — Op Note (Addendum)
08/21/2018  9:49 AM  PATIENT:  [name]  78 y.o. female  PRE-OPERATIVE DIAGNOSIS:  SMALL BOWEL OBSTRUCTION  POST-OPERATIVE DIAGNOSIS:  SMALL BOWEL OBSTRUCTION  PROCEDURE:  Procedure(s): EXPLORATORY LAPAROTOMY WITH LYSIS OF ADHESIONS (N/A) Small Bowel Resection (N/A)  appendectomy  SURGEON:  Surgeon(s) and Role:    * Jovita Kussmaul, MD - Primary    * Coralie Keens, MD - Assisting  PHYSICIAN ASSISTANT:   ASSISTANTS: Dr. Ninfa Linden   ANESTHESIA:   general  EBL:  450 mL   BLOOD ADMINISTERED:none  DRAINS: (1) Jackson-Pratt drain(s) with closed bulb suction in the pelvis   LOCAL MEDICATIONS USED:  NONE  SPECIMEN:  Source of Specimen:  small intestine  DISPOSITION OF SPECIMEN:  PATHOLOGY  COUNTS:  YES  TOURNIQUET:  * No tourniquets in log *  DICTATION: .Dragon Dictation   After informed consent was obtained the patient was brought to the operating room and placed in the supine position on the operating table.  After adequate induction of general anesthesia the patient's abdomen was prepped with ChloraPrep, allowed to dry, and draped in usual sterile manner.  An appropriate timeout was performed.  A lower midline incision was then made with a 10 blade knife.  The incision was carried through the skin and subcutaneous tissue sharply with the electrocautery until the linea alba was identified.  The linea alba was incised with electrocautery.  The preperitoneal space was probed bluntly with a hemostat until the peritoneum was opened and access was gained to the abdominal cavity.  The rest of the incision was then opened under direct vision.  Initially the omentum was stuck to the left pelvic sidewall.  This was freed up sharply with the electrocautery.  I was then able to reflect the omentum superiorly and run the small bowel.  Upon doing this we readily identified some dead loops of small bowel in the pelvis.  We did identify a single very tight band that was lysed sharply with  electrocautery.  Once this was accomplished it it seemed as though the obstruction was relieved and we were able to reflect the small bowel up into the wound.  There was about 1 foot of dead distal small bowel.  The rest of the small bowel was run from the ligament of Treitz to the ileocecal valve.  The colon appeared normal.  The stomach appeared normal.  A site was chosen above and below the area of ischemia where the bowel appeared healthy.  The mesentery at each area was then opened sharply with electrocautery.  A GIA-75 stapler was then placed across the proximal small bowel, clamped, and fired thereby dividing the bowel between staple lines.  The mesentery to the ischemic small bowel was then taken down sharply with the LigaSure.  The proximal and distal healthy segments of small intestine were then approximated with an interrupted 2-0 silk stitch.  There appeared to be plenty of distance between the anastomosis and the ileocecal valve.  A small opening was made with the electrocautery on the antimesenteric surface of each limb of the small intestine.  Each limb of a GIA-75 stapler was then placed down the appropriate limb of small bowel, clamped, fired thereby creating a nice widely patent enteroenterostomy.  The common opening was closed with a single firing of a TA 60 stapler.  At this point the ischemic bowel was removed from the patient and sent to pathology for further evaluation.  The staple line was reinforced with interrupted 2-0 silk stitches.  This included a crotch stitch.  The mesenteric defect was also closed with interrupted 2-0 and 3-0 silk stitches.  The appendix was also identified during the dissection and was secondarily inflamed.  At this point we also decided to remove the appendix since it was readily accessed.  The mesoappendix was taken down sharply with the LigaSure.  The base of the appendix where it joined the cecum was divided with a single firing of a GIA-75 stapler.  The appendix  was removed and sent to pathology.  The staple line appeared healthy.  At this point the abdomen was irrigated with copious amounts of saline.  A small stab incision was made in the right lower quadrant.  A tonsil clamp was placed through this opening and used to bring a 19 [place] round Blake drain into the operative bed.  The drain was placed in the pelvis.  The drain was anchored to the skin with a 3-0 nylon stitch.  The small bowel was then examined again and appeared to be viable and the anastomosis was widely patent.  The small bowel was then placed back into the abdominal cavity and covered with omentum.  The NG tube was confirmed in the stomach.  The anterior abdominal wall fascia was then closed with 2 running #1 double-stranded looped PDS sutures.  The subcutaneous tissue was packed with moistened Kerlix gauze.  Sterile dressings were applied.  The patient tolerated the procedure well.  At the end of the case all needle sponge and instrument counts were correct.  The patient was then likely going to be left intubated and taken to the ICU for further resuscitation measures. The assistant was instrumental for retraction and visualization during the case.  PLAN OF CARE: Admit to inpatient   PATIENT DISPOSITION:  ICU - intubated and critically ill.   Delay start of Pharmacological VTE agent (>24hrs) due to surgical blood loss or risk of bleeding: yes

## 2018-08-21 NOTE — CV Procedure (Signed)
    DIRECT CURRENT CARDIOVERSION  NAME:  [name]   MRN: [id number] DOB:  1940/06/05   ADMIT DATE: 08/13/2018   INDICATIONS: Wide complex tachcardia for hemodynamic instability   PROCEDURE:   Emergent consent assumed. The defibrillator pads placed in the anterior and posterior position. The patient then underwent sedation by the anesthesia service. Once an appropriate level of sedation was achieved, the patient received a single biphasic, synchronized 150J shock with conversion to sinus tach. wth biv pacing.   Glori Bickers, MD  6:22 PM

## 2018-08-21 NOTE — Progress Notes (Signed)
eLink Physician-Brief Progress Note Patient Name: [name] DOB: 03-Apr-1941 MRN: [id number]   Date of Service  08/21/2018  HPI/Events of Note  Sub-optimal sedation with Propofol, triglyceride level is normal. RN requesting quad strength concentration of Levophed.  eICU Interventions  Dex discontinued, Propofol ordered. Levo changed to quad strength.        Frederik Pear 08/21/2018, 7:36 PM

## 2018-08-21 NOTE — Progress Notes (Signed)
Pharmacy Antibiotic Note  [name] is a 78 y.o. female admitted on 08/13/2018 with SBO now s/p ex lap. Pharmacy has been consulted for Zosyn dosing with concern for peritonitis. Initial blood cultures negative and pt completed 7-day course of doxycycline for PNA. Cr stable, WBC up.  Plan: Zosyn 3.375g IV EI q8h  Height: 5\' 4"  (162.6 cm) Weight: 142 lb 13.7 oz (64.8 kg) IBW/kg (Calculated) : 54.7  Temp (24hrs), Avg:98.5 F (36.9 C), Min:98.2 F (36.8 C), Max:98.7 F (37.1 C)  Recent Labs  Lab 08/16/18 0635 08/17/18 0805 08/18/18 0443 08/19/18 0545 08/20/18 1248 08/21/18 0232  WBC 14.0* 13.5* 14.8* 17.5*  --  17.1*  CREATININE 1.19* 1.15* 1.16* 1.29* 1.18* 1.01*    Estimated Creatinine Clearance: 39 mL/min (A) (by C-G formula based on SCr of 1.01 mg/dL (H)).    Allergies  Allergen Reactions  . Macrobid [Nitrofurantoin Macrocrystal] Hives and Itching    Antimicrobials this admission: Zosyn 4/30 >>   Microbiology results: 4/22 UCx: negative 4/22 BCx: negative 4/29 MRSA: positive   Thank you for allowing pharmacy to be a part of this patient's care.   Arrie Senate, PharmD, BCPS Clinical Pharmacist [phone] Please check AMION for all [employer] numbers 08/21/2018

## 2018-08-21 NOTE — Progress Notes (Addendum)
Advanced Heart Failure Rounding Note   Subjective:    Underwent ex-lap today for SBO. Transferred to ICU on ventilator.   Post-op was markedly hypotensive with SBP in 50-50 range despite neo.   Patient switched to NE and given IVF bolus with improvement. Cental line placed co-ox 79%. CVP low.  Now awake on vent puling on ETT.   Objective:   Weight Range:  Vital Signs:   Temp:  [98.2 F (36.8 C)-98.6 F (37 C)] 98.2 F (36.8 C) (04/30 0452) Pulse Rate:  [86-138] 111 (04/30 1645) Resp:  [16-31] 27 (04/30 1645) BP: (69-133)/(49-111) 83/60 (04/30 1500) SpO2:  [95 %-100 %] 95 % (04/30 1645) Arterial Line BP: (83-133)/(39-55) 85/43 (04/30 1500) FiO2 (%):  [50 %-60 %] 50 % (04/30 1642) Last BM Date: 08/11/18  Weight change: Filed Weights   08/13/18 2000 08/14/18 0017 08/14/18 0500  Weight: 66.7 kg 64.8 kg 64.8 kg    Intake/Output:   Intake/Output Summary (Last 24 hours) at 08/21/2018 1711 Last data filed at 08/21/2018 1354 Gross per 24 hour  Intake 2893.43 ml  Output 3760 ml  Net -866.57 ml     Physical Exam: General:  Elderly chronically ill appearing on vent HEENT: normal + ETT Neck: supple. JVP flat. Carotids 2+ bilat; no bruits. No lymphadenopathy or thryomegaly appreciated. Cor: PMI nondisplaced. Tachy regular  Lungs: clear Abdomen: Soft. + operative dressing with drain. No bowel sounds. Extremities: no cyanosis, clubbing, rash, edema RUE with IV infiltrate Neuro: agitated on vent  Telemetry: Sinus tach 100-110 Personally reviewed   Labs: Basic Metabolic Panel: Recent Labs  Lab 08/15/18 0407 08/16/18 2725 08/17/18 0805 08/18/18 0443 08/19/18 0545 08/20/18 1248 08/21/18 0232 08/21/18 0916 08/21/18 1154 08/21/18 1202  NA 139 143 144 142 142 146* 146* 150* 150* 147*  K 3.1* 3.3* 3.6 3.9 4.5 4.1 3.9 3.7 4.2 4.5  CL 107 112* 113* 114* 113* 115* 117*  --   --  122*  CO2 21* 21* 20* 18* 16* 17* 15*  --   --  16*  GLUCOSE 102* 88 89 95 91 74 67*  74  --  90  BUN 70* 65* 57* 63* 68* 52* 41*  --   --  42*  CREATININE 1.46* 1.19* 1.15* 1.16* 1.29* 1.18* 1.01*  --   --  1.26*  CALCIUM 8.0* 8.2* 8.4* 8.5* 8.5* 8.5* 8.7*  --   --  8.1*  MG 3.0* 3.3* 3.0*  --   --  2.6*  --   --   --   --   PHOS  --   --  3.6 4.2 4.7*  --   --   --   --   --     Liver Function Tests: Recent Labs  Lab 08/15/18 0407 08/16/18 0635 08/17/18 0805 08/18/18 0443 08/19/18 0545 08/21/18 1202  AST 59* 45*  --   --   --  32  ALT 30 28  --   --   --  16  ALKPHOS 133* 133*  --   --   --  88  BILITOT 1.6* 1.2  --   --   --  1.8*  PROT 6.8 6.6  --   --   --  5.1*  ALBUMIN 2.3* 2.2* 2.2* 2.2* 2.3* 2.2*   No results for input(s): LIPASE, AMYLASE in the last 168 hours. No results for input(s): AMMONIA in the last 168 hours.  CBC: Recent Labs  Lab 08/15/18 0407 08/16/18 3664 08/17/18 0805  08/18/18 0443 08/19/18 0545 08/21/18 0232 08/21/18 0916 08/21/18 1154 08/21/18 1202  WBC 12.3* 14.0* 13.5* 14.8* 17.5* 17.1*  --   --  16.9*  NEUTROABS 9.8* 10.7* 9.8* 10.7*  --   --   --   --   --   HGB 9.4* 9.3* 10.1* 9.7* 8.8* 9.0* 7.8* 13.6 13.2  HCT 29.8* 32.0* 32.7* 32.5* 29.2* 29.0* 23.0* 40.0 41.6  MCV 82.5 85.8 84.9 84.6 85.9 82.6  --   --  87.0  PLT 193 233 241 246 275 217  --   --  187    Cardiac Enzymes: Recent Labs  Lab 08/21/18 1202  TROPONINI 0.05*    BNP: BNP (last 3 results) Recent Labs    08/13/18 1926  BNP 404.0*    ProBNP (last 3 results) No results for input(s): PROBNP in the last 8760 hours.    Other results:  Imaging: Dg Chest Port 1 View  Result Date: 08/21/2018 CLINICAL DATA:  Status post laparotomy for small bowel obstruction. EXAM: PORTABLE CHEST 1 VIEW COMPARISON:  Chest x-ray dated August 13, 2018. FINDINGS: Endotracheal tube tip 2.7 cm above the carina. Enteric tube in the stomach. Unchanged left chest wall pacemaker. Stable cardiomegaly. Atherosclerotic calcification of the aortic arch. Normal pulmonary vascularity.  Right greater than left basilar atelectasis with small bilateral pleural effusions. No pneumothorax. No acute osseous abnormality. IMPRESSION: 1. Appropriately positioned endotracheal and enteric tubes. 2. Bibasilar atelectasis with small bilateral pleural effusions. Electronically Signed   By: Titus Dubin M.D.   On: 08/21/2018 12:09   Dg Chest Port 1v Same Day  Result Date: 08/21/2018 CLINICAL DATA:  Central line placement EXAM: PORTABLE CHEST 1 VIEW COMPARISON:  Portable exam 1626 hours compared to 1148 hours FINDINGS: Tip of endotracheal tube projects 3.9 cm above carina. Nasogastric tube extends into stomach. RIGHT jugular central venous catheter with tip projecting over cavoatrial junction. LEFT subclavian AICD leads unchanged. Upper normal heart size. Atherosclerotic calcifications aorta. Bibasilar atelectasis and LEFT pleural effusion again seen. Upper lungs clear. No pneumothorax following central line placement. Bones demineralized. IMPRESSION: No pneumothorax following central line placement. Persistent bibasilar atelectasis and LEFT pleural effusion. Electronically Signed   By: Lavonia Dana M.D.   On: 08/21/2018 16:51   Dg Abd Portable 1v  Result Date: 08/20/2018 CLINICAL DATA:  78 year old female with small bowel obstruction EXAM: PORTABLE ABDOMEN - 1 VIEW COMPARISON:  CT 08/19/2018 FINDINGS: Similar appearance of the dilated small bowel, with no significant change. Paucity of colonic gas. Gastric tube terminates left upper quadrant. IMPRESSION: Persisting small bowel obstruction without significant change. Gastric tube terminates in the left upper quadrant. Electronically Signed   By: Corrie Mckusick D.O.   On: 08/20/2018 13:38   [place] Ekg Site Rite  Result Date: 08/21/2018 If Site Rite image not attached, placement could not be confirmed due to current cardiac rhythm.     Medications:     Scheduled Medications:  chlorhexidine gluconate (MEDLINE KIT)  15 mL Mouth Rinse BID   [START  ON 08/22/2018] enoxaparin (LOVENOX) injection  30 mg Subcutaneous Q24H   mouth rinse  15 mL Mouth Rinse 10 times per day   metoprolol tartrate  2.5 mg Intravenous Q6H   pantoprazole (PROTONIX) IV  40 mg Intravenous QHS   sterile water (preservative free)  2 mL Injection Once   sterile water (preservative free)         Infusions:  sodium chloride 10 mL/hr at 08/17/18 1000   sodium chloride Stopped (08/21/18 1319)  sodium chloride Stopped (08/21/18 1549)   dexmedetomidine (PRECEDEX) IV infusion 0.4 mcg/kg/hr (08/21/18 1317)   lactated ringers 999 mL/hr at 08/21/18 1535   lactated ringers 50 mL/hr at 08/21/18 1349   norepinephrine (LEVOPHED) Adult infusion 10 mcg/min (08/21/18 1517)   phenylephrine (NEO-SYNEPHRINE) Adult infusion 75 mcg/min (08/21/18 1350)   piperacillin-tazobactam (ZOSYN)  IV 3.375 g (08/21/18 1243)   vasopressin (PITRESSIN) infusion - *FOR SHOCK*     vasopressin (PITRESSIN) infusion - *FOR SHOCK* 0.03 Units/min (08/21/18 1656)     PRN Medications:  sodium chloride, acetaminophen **OR** acetaminophen, fentaNYL (SUBLIMAZE) injection, ondansetron **OR** ondansetron (ZOFRAN) IV   Assessment:   [name] is a 78 y.o. female with a history of chronic combined systolic and diastolic CHF due to iCM with EF of 30% on Echo this admit s/p ICD, CAD with remote MI, hypertension, hyperlipidemia, CKD stage III, chronic anemia, and GERD who underwent ex-lap for SBO on 4/30. Transferred to ICU post-op for severe hypotension and shock   Plan/Discussion:    1. Shock - now on NE 20. Co-ox 79% so suepect major issue is sepsis and hypovolemia and not cardiogenic - Continue IVF and pressors - continue broad spectrum abx - follow cx  2. Acute hypoxic respiratory failure - due to #1 - CCM managing vent  3. SBO s/p ex lap with lysis of adhesions on 4/30 - GSU managing  4. AKI on CKD stage 4  - Creatinine peaked at 3.21 this admission on 08/13/2018.  Creatinine 1.26 pre-op today.  - likely due to ATN  5. Chronic systolic HF due to iCM - EF 30% - co-ox ok. Volume low.  - continue pressors as needed. Follow co-ox and CVP  6. CAD - stable no s/s ischemia currently  CRITICAL CARE Performed by: Glori Bickers  Total critical care time: 45 minutes  Critical care time was exclusive of separately billable procedures and treating other patients.  Critical care was necessary to treat or prevent imminent or life-threatening deterioration.  Critical care was time spent personally by me (independent of midlevel providers or residents) on the following activities: development of treatment plan with patient and/or surrogate as well as nursing, discussions with consultants, evaluation of patient's response to treatment, examination of patient, obtaining history from patient or surrogate, ordering and performing treatments and interventions, ordering and review of laboratory studies, ordering and review of radiographic studies, pulse oximetry and re-evaluation of patient's condition.    Length of Stay: 8   Glori Bickers MD 08/21/2018, 5:11 PM  Advanced Heart Failure Team Pager [phone] (M-F; 7a - 4p)  Please contact [facility] for night-coverage after hours (4p -7a ) and weekends on [email or web address]

## 2018-08-21 NOTE — Anesthesia Postprocedure Evaluation (Signed)
Anesthesia Post Note  Patient: [name]  Procedure(s) Performed: EXPLORATORY LAPAROTOMY WITH LYSIS OF ADHESIONS (N/A Abdomen) Small Bowel Resection (N/A Abdomen)     Patient location during evaluation: ICU Anesthesia Type: General Level of consciousness: sedated Pain management: pain level controlled Vital Signs Assessment: post-procedure vital signs reviewed and stable Respiratory status: patient remains intubated per anesthesia plan Cardiovascular status: stable Postop Assessment: no apparent nausea or vomiting Anesthetic complications: no    Last Vitals:  Vitals:   08/21/18 1025 08/21/18 1110  BP:    Pulse:  (!) 117  Resp:  16  Temp:    SpO2: 98% 97%    Last Pain:  Vitals:   08/21/18 0703  TempSrc:   PainSc: 0-No pain                 Barnet Glasgow

## 2018-08-22 ENCOUNTER — Inpatient Hospital Stay (HOSPITAL_COMMUNITY): Payer: Medicare Other

## 2018-08-22 ENCOUNTER — Encounter (HOSPITAL_COMMUNITY): Payer: Self-pay | Admitting: General Surgery

## 2018-08-22 DIAGNOSIS — R6521 Severe sepsis with septic shock: Secondary | ICD-10-CM

## 2018-08-22 DIAGNOSIS — J181 Lobar pneumonia, unspecified organism: Secondary | ICD-10-CM

## 2018-08-22 DIAGNOSIS — A419 Sepsis, unspecified organism: Secondary | ICD-10-CM

## 2018-08-22 DIAGNOSIS — I2510 Atherosclerotic heart disease of native coronary artery without angina pectoris: Secondary | ICD-10-CM

## 2018-08-22 DIAGNOSIS — J9601 Acute respiratory failure with hypoxia: Secondary | ICD-10-CM

## 2018-08-22 DIAGNOSIS — K56609 Unspecified intestinal obstruction, unspecified as to partial versus complete obstruction: Secondary | ICD-10-CM

## 2018-08-22 DIAGNOSIS — I5022 Chronic systolic (congestive) heart failure: Secondary | ICD-10-CM

## 2018-08-22 DIAGNOSIS — I472 Ventricular tachycardia: Secondary | ICD-10-CM

## 2018-08-22 DIAGNOSIS — Z9861 Coronary angioplasty status: Secondary | ICD-10-CM

## 2018-08-22 LAB — COOXEMETRY PANEL
Carboxyhemoglobin: 0.9 % (ref 0.5–1.5)
Methemoglobin: 1.8 % — ABNORMAL HIGH (ref 0.0–1.5)
O2 Saturation: 69.8 %
Total hemoglobin: 11.5 g/dL — ABNORMAL LOW (ref 12.0–16.0)

## 2018-08-22 LAB — BASIC METABOLIC PANEL
Anion gap: 13 (ref 5–15)
BUN: 44 mg/dL — ABNORMAL HIGH (ref 8–23)
CO2: 16 mmol/L — ABNORMAL LOW (ref 22–32)
Calcium: 6.9 mg/dL — ABNORMAL LOW (ref 8.9–10.3)
Chloride: 119 mmol/L — ABNORMAL HIGH (ref 98–111)
Creatinine, Ser: 2.02 mg/dL — ABNORMAL HIGH (ref 0.44–1.00)
GFR calc Af Amer: 27 mL/min — ABNORMAL LOW (ref >60)
GFR calc non Af Amer: 23 mL/min — ABNORMAL LOW (ref >60)
Glucose, Bld: 111 mg/dL — ABNORMAL HIGH (ref 70–99)
Potassium: 5 mmol/L (ref 3.5–5.1)
Sodium: 148 mmol/L — ABNORMAL HIGH (ref 135–145)

## 2018-08-22 LAB — CBC
HCT: 36.3 % (ref 36.0–46.0)
Hemoglobin: 11.6 g/dL — ABNORMAL LOW (ref 12.0–15.0)
MCH: 27.4 pg (ref 26.0–34.0)
MCHC: 32 g/dL (ref 30.0–36.0)
MCV: 85.6 fL (ref 80.0–100.0)
Platelets: 148 10*3/uL — ABNORMAL LOW (ref 150–400)
RBC: 4.24 MIL/uL (ref 3.87–5.11)
RDW: 19 % — ABNORMAL HIGH (ref 11.5–15.5)
WBC: 47.6 10*3/uL — ABNORMAL HIGH (ref 4.0–10.5)
nRBC: 0.4 % — ABNORMAL HIGH (ref 0.0–0.2)

## 2018-08-22 LAB — POCT I-STAT 7, (LYTES, BLD GAS, ICA,H+H)
Acid-base deficit: 9 mmol/L — ABNORMAL HIGH (ref 0.0–2.0)
Bicarbonate: 14 mmol/L — ABNORMAL LOW (ref 20.0–28.0)
Calcium, Ion: 1.09 mmol/L — ABNORMAL LOW (ref 1.15–1.40)
HCT: 34 % — ABNORMAL LOW (ref 36.0–46.0)
Hemoglobin: 11.6 g/dL — ABNORMAL LOW (ref 12.0–15.0)
O2 Saturation: 99 %
Patient temperature: 98.2
Potassium: 5.3 mmol/L — ABNORMAL HIGH (ref 3.5–5.1)
Sodium: 148 mmol/L — ABNORMAL HIGH (ref 135–145)
TCO2: 15 mmol/L — ABNORMAL LOW (ref 22–32)
pCO2 arterial: 22.7 mmHg — ABNORMAL LOW (ref 32.0–48.0)
pH, Arterial: 7.396 (ref 7.350–7.450)
pO2, Arterial: 133 mmHg — ABNORMAL HIGH (ref 83.0–108.0)

## 2018-08-22 LAB — BPAM RBC
Blood Product Expiration Date: 202005052359
Blood Product Expiration Date: 202005052359
ISSUE DATE / TIME: 202004300922
ISSUE DATE / TIME: 202004300922
Unit Type and Rh: 5100
Unit Type and Rh: 5100

## 2018-08-22 LAB — TYPE AND SCREEN
ABO/RH(D): AB POS
Antibody Screen: NEGATIVE
Unit division: 0
Unit division: 0

## 2018-08-22 LAB — TROPONIN I: Troponin I: 0.11 ng/mL (ref <0.03)

## 2018-08-22 MED ORDER — SODIUM CHLORIDE 0.9 % IV SOLN
INTRAVENOUS | Status: DC
  Administered 2018-08-22: 06:00:00 via INTRAVENOUS

## 2018-08-22 NOTE — Plan of Care (Signed)
[name] has been slow to wean off of the norepinephrine drip, now at 43mcg/min, down from 173mcg/min at 2100 on 4/30. Patient is sedated on fentanyl/versed tolerating the ventilator well. On 4/30 norepinephrine & phenylephrine both infiltrated the patient's R. Arm, which was followed by injection of Phentolamine. Patient's arm has now blistered and become cold. Continuing to monitor for further progression.

## 2018-08-22 NOTE — Progress Notes (Signed)
10 cc of Versed wasted in sink.  Witnessed by Eloise Harman RN.

## 2018-08-22 NOTE — Progress Notes (Addendum)
NAME:  BALINDA HEACOCK, MRN:  [id number], DOB:  Jan 17, 1941, LOS: 9 ADMISSION DATE:  08/13/2018, CONSULTATION DATE:  4/20 REFERRING MD:  Renne Crigler , CHIEF COMPLAINT:  Vent management    Brief History   78 year old female with significant cardiac history that is post exploratory laparotomy for small bowel obstruction with associated ischemic bowel on 4/30, return to the intensive care on mechanical ventilator, critical care asked to assist with care  Past Medical History  Congestive heart failure with EF 30%, nonsustained VT, pericardial effusion CAD w/ prior AICD, diastolic heart failure, CKD stage III, hypertension, rheumatoid arthritis, anemia of chronic disease.  Significant Hospital Events   4/22 Admitted to [facility] with small bowel obstruction.  Also started on antibiotics for possible PNA 4/28 ongoing high-grade bowel obstruction 4/29 arrived at [facility] 4/30 Ex-lap with lysis of adhesion, bowel resection for ischemic bowel return to ICU on ventilator 4/30 Shocked for VT 5/01 Remains on 40 mcg levophed, CVP 4, AKI, less JP drainage   Consults:  Radiology 4/29  Procedures:  R Fem ALine 4/30 >>   Significant Diagnostic Tests:  CT imaging 4/29 >> Persistent small bowel obstruction  Micro Data:  BCx2 4/22 >> neg UC 4/22 >> neg  Antimicrobials:  Doxy 4/22 >> 4/28  Interim history/subjective:  RN reports pt remain on 40 mcg levophed, vasopressin turned off overnight for unclear reasons.  Failed SBT due to tachypnea. RUE swollen, concern for IV extravasation. Shocked for VT 4/30 pm   Objective   Blood pressure (!) 94/29, pulse 89, temperature 99.5 F (37.5 C), resp. rate (!) 21, height 5\' 4"  (1.626 m), weight 70.3 kg, SpO2 98 %. CVP:  [4 mmHg-8 mmHg] 7 mmHg  Vent Mode: PRVC FiO2 (%):  [40 %-60 %] 40 % Set Rate:  [16 bmp] 16 bmp Vt Set:  [430 mL] 430 mL PEEP:  [5 cmH20] 5 cmH20 Plateau Pressure:  [11 cmH20-19 cmH20] 13 cmH20   Intake/Output Summary (Last 24 hours) at 08/22/2018  0754 Last data filed at 08/22/2018 4166 Gross per 24 hour  Intake 6359.33 ml  Output 3255 ml  Net 3104.33 ml   Filed Weights   08/14/18 0017 08/14/18 0500 08/22/18 0625  Weight: 64.8 kg 64.8 kg 70.3 kg    Examination: General: ill appearing elderly female lying in bed on vent in NAD HEENT: MM pink/moist, ETT, R IJ TLC  Neuro: sedate, awakens to voice, interacts then drifts back to sleep  CV: s1s2 rrr, V-paced rhythm  PULM: even/non-labored, lungs bilaterally clear  GI: midline abd dressing, JP drain with light serosanguinous  Extremities: warm/dry, RUE edema, small skin tears on right forearm (had IV's in arm 4/30, concern for extravasation, given regitine overnight, BP cuff was on same arm, L arm restricted) Skin: warm / dry  Resolved Hospital Problem list   RLL PNA - treated with 7 days doxycycline   Assessment & Plan:    Small Bowel Obstruction s/p Exploratory Laparotomy -initial admit 4/22 at [facility], failed conservative therapy  P: Post op recommendations per CCS  Follow JP drainage  Defer timing of feeding to CCS > TPN vs enteral trial   Acute Respiratory Insufficiency  -post op exploratory laparotomy  P: PRVC 8cc/kg  Wean PEEP / FiO2 for sats >90% PSV wean as tolerated  Follow CXR intermittently   Shock  -?ABL, +/- sepsis with ischemic bowel, volume depletion -immune suppressed on Plaquenil for RA  P: Levophed for SBP >100 Follow CVP  Gentle IVF's   Chronic Systolic /  Diastolic CHF  -LVEF 25-36%, diffuse hypokinesis 08/19/18, AICD in place AF/Flutter, VT -s/p cardioversion for wide complex tachycardia 4/30 pm P: IVF Tele monitoring  CHF service following, appreciate input  Continue amiodarone  Lopressor 2.5 mg IV Q6  At Risk AKI, Oliguria  CKD III  P: Trend BMP / urinary output Replace electrolytes as indicated Avoid nephrotoxic agents, ensure adequate renal perfusion May need HD, no acute indications at this time. Hopeful today is peak of injury.    At Risk Malnutrition  P: Defer feeding to CCS   RA on Plaquenil P: Hold plaquenil   Anemia -ABL + ACD P: Trend CBC  Tranfuse per guidelines   Best practice:  Diet: NPO  Pain/Anxiety/Delirium protocol (if indicated): PRN fentanyl + precedex  VAP protocol (if indicated): in place  DVT prophylaxis: SCD's, lovenox  GI prophylaxis: PPI Glucose control: n/a Mobility: bedrest  Code Status: Full Code  Family Communication: Updated 5/1 per Dr. Grandville Silos  Disposition: ICU  Labs   CBC: Recent Labs  Lab 08/16/18 6440 08/17/18 0805 08/18/18 0443 08/19/18 0545 08/21/18 0232 08/21/18 0916 08/21/18 1154 08/21/18 1202 08/21/18 2053 08/22/18 0401  WBC 14.0* 13.5* 14.8* 17.5* 17.1*  --   --  16.9* 49.3* 47.6*  NEUTROABS 10.7* 9.8* 10.7*  --   --   --   --   --   --   --   HGB 9.3* 10.1* 9.7* 8.8* 9.0* 7.8* 13.6 13.2 11.7* 11.6*  HCT 32.0* 32.7* 32.5* 29.2* 29.0* 23.0* 40.0 41.6 36.1 36.3  MCV 85.8 84.9 84.6 85.9 82.6  --   --  87.0 85.5 85.6  PLT 233 241 246 275 217  --   --  187 136* 148*    Basic Metabolic Panel: Recent Labs  Lab 08/16/18 0635 08/17/18 0805 08/18/18 0443 08/19/18 0545 08/20/18 1248 08/21/18 0232 08/21/18 0916 08/21/18 1154 08/21/18 1202 08/21/18 2053 08/22/18 0401  NA 143 144 142 142 146* 146* 150* 150* 147* 149* 148*  K 3.3* 3.6 3.9 4.5 4.1 3.9 3.7 4.2 4.5 5.0 5.0  CL 112* 113* 114* 113* 115* 117*  --   --  122* 120* 119*  CO2 21* 20* 18* 16* 17* 15*  --   --  16* 15* 16*  GLUCOSE 88 89 95 91 74 67* 74  --  90 90 111*  BUN 65* 57* 63* 68* 52* 41*  --   --  42* 45* 44*  CREATININE 1.19* 1.15* 1.16* 1.29* 1.18* 1.01*  --   --  1.26* 1.89* 2.02*  CALCIUM 8.2* 8.4* 8.5* 8.5* 8.5* 8.7*  --   --  8.1* 7.5* 6.9*  MG 3.3* 3.0*  --   --  2.6*  --   --   --   --   --   --   PHOS  --  3.6 4.2 4.7*  --   --   --   --   --   --   --    GFR: Estimated Creatinine Clearance: 21.7 mL/min (A) (by C-G formula based on SCr of 2.02 mg/dL (H)). Recent Labs  Lab  08/21/18 0232 08/21/18 1202 08/21/18 1925 08/21/18 2053 08/21/18 2054 08/22/18 0401  WBC 17.1* 16.9*  --  49.3*  --  47.6*  LATICACIDVEN  --   --  3.8*  --  4.8*  --     Liver Function Tests: Recent Labs  Lab 08/16/18 3474 08/17/18 0805 08/18/18 0443 08/19/18 0545 08/21/18 1202  AST 45*  --   --   --  32  ALT 28  --   --   --  16  ALKPHOS 133*  --   --   --  88  BILITOT 1.2  --   --   --  1.8*  PROT 6.6  --   --   --  5.1*  ALBUMIN 2.2* 2.2* 2.2* 2.3* 2.2*   No results for input(s): LIPASE, AMYLASE in the last 168 hours. No results for input(s): AMMONIA in the last 168 hours.  ABG    Component Value Date/Time   PHART 7.377 08/21/2018 2005   PCO2ART 24.4 (L) 08/21/2018 2005   PO2ART 107 08/21/2018 2005   HCO3 14.0 (L) 08/21/2018 2005   TCO2 18 (L) 08/21/2018 1154   ACIDBASEDEF 10.1 (H) 08/21/2018 2005   O2SAT 97.4 08/21/2018 2005     Coagulation Profile: No results for input(s): INR, PROTIME in the last 168 hours.  Cardiac Enzymes: Recent Labs  Lab 08/21/18 1202 08/21/18 1925 08/22/18 0401  TROPONINI 0.05* 0.08* 0.11*    HbA1C: No results found for: HGBA1C  CBG: Recent Labs  Lab 08/21/18 1200  GLUCAP 82      Critical care time: 26 minutes    Noe Gens, NP-C [facility] Pgr: [phone] or if no answer [phone] 08/22/2018, 7:54 AM    PCCM Attending:   78 yo FM, s/p laparotomy, SBO with bowel strangulated with ischemia, patient taken to the OR on 4/30. Overnight with significant JP drainage from abdomen. Baseline ICM EF 30%, AICD, h/o NSVT. Yesterday after coming back from OR concern for IV infiltration in the right arm. She had wide complex tachy event and was shocked by cardiology.   BP (!) 120/34   Pulse 88   Temp 98.5 F (36.9 C) (Axillary)   Resp (!) 21   Ht 5\' 4"  (1.626 m)   Wt 70.3 kg   SpO2 100%   BMI 26.60 kg/m   Gen: elderly fm, on vent, awake to voice  Neck: ETT in place  Heart: RRR, s1 s2  Lungs:  BL Vented breaths  A:  Septic Shock Acute ischemic bowel, SBO AHRF 2/2 above, intubated, on MV  Wide complex tachycardia  Leukocytosis  ARF   P: Post-op care per surgery  Weaning off NEpi Started vasopressin back this AM  Would keep of Neo d/t ICM  Wean fio2 for sats >90 Metoprolol held while on vasopressors  Follow UOP  TPN per CCS  This patient is critically ill with multiple organ system failure; which, requires frequent high complexity decision making, assessment, support, evaluation, and titration of therapies. This was completed through the application of advanced monitoring technologies and extensive interpretation of multiple databases. During this encounter critical care time was devoted to patient care services described in this note for 34 minutes.   Garner Nash, DO [facility] 08/22/2018 12:04 PM  Personal pager: [phone] If unanswered, please page CCM On-call: [phone]

## 2018-08-22 NOTE — Progress Notes (Signed)
Patient ID: [name], female   DOB: [birth year], 78 y.o.   MRN: [id number]    1 Day Post-Op  Subjective: Events from overnight noted.  Patient did have a BM overnight.  On levo still.  Not really making any urine.  about 12cc out currently.  Objective: Vital signs in last 24 hours: Temp:  [99.5 F (37.5 C)] 99.5 F (37.5 C) (04/30 2300) Pulse Rate:  [89-141] 89 (05/01 0600) Resp:  [16-33] 21 (05/01 0600) BP: (63-133)/(24-111) 94/29 (05/01 0600) SpO2:  [93 %-100 %] 98 % (05/01 0600) Arterial Line BP: (75-134)/(36-55) 122/44 (05/01 0600) FiO2 (%):  [40 %-60 %] 40 % (05/01 0438) Weight:  [70.3 kg] 70.3 kg (05/01 0625) Last BM Date: 08/21/18  Intake/Output from previous day: 04/30 0701 - 05/01 0700 In: 6359.3 [I.V.:3295.8; Blood:580; IV Piggyback:2483.5] Out: 0539 [Urine:305; Emesis/NG output:975; Drains:1125; Blood:650] Intake/Output this shift: No intake/output data recorded.  PE: Gen: critically ill on vent Heart: paced Lungs: CTAB on vent Abd: soft, minimal distention, +BS, midline wound is open and clean.  NGT with minimal to no output currently.  JP drain with about 1L of serosang output overnight  Lab Results:  Recent Labs    08/21/18 2053 08/22/18 0401  WBC 49.3* 47.6*  HGB 11.7* 11.6*  HCT 36.1 36.3  PLT 136* 148*   BMET Recent Labs    08/21/18 2053 08/22/18 0401  NA 149* 148*  K 5.0 5.0  CL 120* 119*  CO2 15* 16*  GLUCOSE 90 111*  BUN 45* 44*  CREATININE 1.89* 2.02*  CALCIUM 7.5* 6.9*   PT/INR No results for input(s): LABPROT, INR in the last 72 hours. CMP     Component Value Date/Time   NA 148 (H) 08/22/2018 0401   K 5.0 08/22/2018 0401   CL 119 (H) 08/22/2018 0401   CO2 16 (L) 08/22/2018 0401   GLUCOSE 111 (H) 08/22/2018 0401   BUN 44 (H) 08/22/2018 0401   CREATININE 2.02 (H) 08/22/2018 0401   CREATININE 1.07 (H) 02/13/2018 1142   CALCIUM 6.9 (L) 08/22/2018 0401   PROT 5.1 (L) 08/21/2018 1202   ALBUMIN 2.2 (L) 08/21/2018 1202   AST  32 08/21/2018 1202   ALT 16 08/21/2018 1202   ALKPHOS 88 08/21/2018 1202   BILITOT 1.8 (H) 08/21/2018 1202   GFRNONAA 23 (L) 08/22/2018 0401   GFRNONAA 49 (L) 02/13/2018 1142   GFRAA 27 (L) 08/22/2018 0401   GFRAA 57 (L) 02/13/2018 1142   Lipase     Component Value Date/Time   LIPASE 30 08/13/2018 1926       Studies/Results: Dg Chest Port 1 View  Result Date: 08/21/2018 CLINICAL DATA:  Status post laparotomy for small bowel obstruction. EXAM: PORTABLE CHEST 1 VIEW COMPARISON:  Chest x-ray dated August 13, 2018. FINDINGS: Endotracheal tube tip 2.7 cm above the carina. Enteric tube in the stomach. Unchanged left chest wall pacemaker. Stable cardiomegaly. Atherosclerotic calcification of the aortic arch. Normal pulmonary vascularity. Right greater than left basilar atelectasis with small bilateral pleural effusions. No pneumothorax. No acute osseous abnormality. IMPRESSION: 1. Appropriately positioned endotracheal and enteric tubes. 2. Bibasilar atelectasis with small bilateral pleural effusions. Electronically Signed   By: Titus Dubin M.D.   On: 08/21/2018 12:09   Dg Chest Port 1v Same Day  Result Date: 08/21/2018 CLINICAL DATA:  Central line placement EXAM: PORTABLE CHEST 1 VIEW COMPARISON:  Portable exam 1626 hours compared to 1148 hours FINDINGS: Tip of endotracheal tube projects 3.9 cm above carina. Nasogastric tube  extends into stomach. RIGHT jugular central venous catheter with tip projecting over cavoatrial junction. LEFT subclavian AICD leads unchanged. Upper normal heart size. Atherosclerotic calcifications aorta. Bibasilar atelectasis and LEFT pleural effusion again seen. Upper lungs clear. No pneumothorax following central line placement. Bones demineralized. IMPRESSION: No pneumothorax following central line placement. Persistent bibasilar atelectasis and LEFT pleural effusion. Electronically Signed   By: Lavonia Dana M.D.   On: 08/21/2018 16:51   Dg Abd Portable 1v  Result  Date: 08/20/2018 CLINICAL DATA:  78 year old female with small bowel obstruction EXAM: PORTABLE ABDOMEN - 1 VIEW COMPARISON:  CT 08/19/2018 FINDINGS: Similar appearance of the dilated small bowel, with no significant change. Paucity of colonic gas. Gastric tube terminates left upper quadrant. IMPRESSION: Persisting small bowel obstruction without significant change. Gastric tube terminates in the left upper quadrant. Electronically Signed   By: Corrie Mckusick D.O.   On: 08/20/2018 13:38   [place] Ekg Site Rite  Result Date: 08/21/2018 If Site Rite image not attached, placement could not be confirmed due to current cardiac rhythm.   Anti-infectives: Anti-infectives (From admission, onward)   Start     Dose/Rate Route Frequency Ordered Stop   08/21/18 1200  piperacillin-tazobactam (ZOSYN) IVPB 3.375 g     3.375 g 12.5 mL/hr over 240 Minutes Intravenous Every 8 hours 08/21/18 1102     08/14/18 1000  hydroxychloroquine (PLAQUENIL) tablet 200 mg  Status:  Discontinued    Note to Pharmacy:  Take 1 tablet by mouth daily Monday through Friday only     200 mg Oral Once per day on Mon Tue Wed Thu Fri 08/14/18 4287 08/21/18 1147   08/13/18 2200  doxycycline (VIBRA-TABS) tablet 100 mg  Status:  Discontinued     100 mg Oral Every 12 hours 08/13/18 2134 08/13/18 2135   08/13/18 2200  doxycycline (VIBRAMYCIN) 100 mg in sodium chloride 0.9 % 250 mL IVPB  Status:  Discontinued     100 mg 125 mL/hr over 120 Minutes Intravenous Every 12 hours 08/13/18 2135 08/19/18 1702   08/13/18 2145  cefTRIAXone (ROCEPHIN) 1 g in sodium chloride 0.9 % 100 mL IVPB  Status:  Discontinued     1 g 200 mL/hr over 30 Minutes Intravenous Every 24 hours 08/13/18 2134 08/14/18 0743       Assessment/Plan POD 1, s/p ex lap with SBR for SBO and ischemic bowel -now in septic shock.  WBC up to 47K.  Still on levophed at 30 -cont NGT and bowel rest for now -NS WD dressing changes to midline wound BID -cont JP drain for now  -cont zosyn   Acute on chronic renal failure -cont foley for accurate I&Os -minimal UOP -cont fluids, but has a history of CHF as well.  Defer fluid balance to CCM  ICD/CAD/CHF -per cardiology.  Pacer had to be interrogated overnight and packing had to be adjusted -on amio  VDRF -per CCM  FEN - NPO/NGT/IVFs VTE - Lovenox/SCD ID - zosyn   LOS: 9 days    [name] , [facility] 08/22/2018, 7:40 AM Pager: [phone]

## 2018-08-22 NOTE — Progress Notes (Signed)
Cardiology Rounding Note   Subjective:    Remains intubated. Awake on vent.   Underwent overdrive pacing for recurrent AFL/atach last night and back-up rate set at 90. Remains a-paced at 90. On amio.  BP very labile. Required high doses of NE this am but now back down to 30 and SBP 150-170.   CVP 4  MV sat 70% Poor urine output    Objective:   Weight Range:  Vital Signs:   Temp:  [98.5 F (36.9 C)-99.5 F (37.5 C)] 98.5 F (36.9 C) (05/01 0800) Pulse Rate:  [87-141] 88 (05/01 1045) Resp:  [18-33] 21 (05/01 1045) BP: (63-133)/(24-111) 120/34 (05/01 1000) SpO2:  [93 %-100 %] 100 % (05/01 1045) Arterial Line BP: (75-171)/(36-55) 171/50 (05/01 1045) FiO2 (%):  [40 %-50 %] 40 % (05/01 0802) Weight:  [70.3 kg] 70.3 kg (05/01 0625) Last BM Date: (P) 08/21/18  Weight change: Filed Weights   08/14/18 0017 08/14/18 0500 08/22/18 0625  Weight: 64.8 kg 64.8 kg 70.3 kg    Intake/Output:   Intake/Output Summary (Last 24 hours) at 08/22/2018 1137 Last data filed at 08/22/2018 1000 Gross per 24 hour  Intake 4852.26 ml  Output 1187 ml  Net 3665.26 ml     Physical Exam: General:  Awake on vent. Elderly Ill appearing HEENT: normal Neck: supple. JVP flat RIJ TLC . Carotids 2+ bilat; no bruits. No lymphadenopathy or thryomegaly appreciated. Cor: PMI nondisplaced. Regular rate & rhythm. No rubs, gallops or murmurs. Lungs: clear Abdomen: soft, surgical dressing C/D/I. Minimal drainage in JP. No bowel sounds  Extremities: no cyanosis, clubbing, rash, edema RUE wrapped with blisters underneath due to IV infiltrate Neuro: alert & orientedx3, cranial nerves grossly intact. moves all 4 extremities w/o difficulty. Affect pleasant  Telemetry: A pacing 90s Personally reviewed   Labs: Basic Metabolic Panel: Recent Labs  Lab 08/16/18 0635 08/17/18 0805 08/18/18 0443 08/19/18 0545 08/20/18 1248 08/21/18 0232 08/21/18 0916 08/21/18 1154 08/21/18 1202 08/21/18 2053 08/21/18  2125 08/22/18 0401  NA 143 144 142 142 146* 146* 150* 150* 147* 149* 148* 148*  K 3.3* 3.6 3.9 4.5 4.1 3.9 3.7 4.2 4.5 5.0 5.3* 5.0  CL 112* 113* 114* 113* 115* 117*  --   --  122* 120*  --  119*  CO2 21* 20* 18* 16* 17* 15*  --   --  16* 15*  --  16*  GLUCOSE 88 89 95 91 74 67* 74  --  90 90  --  111*  BUN 65* 57* 63* 68* 52* 41*  --   --  42* 45*  --  44*  CREATININE 1.19* 1.15* 1.16* 1.29* 1.18* 1.01*  --   --  1.26* 1.89*  --  2.02*  CALCIUM 8.2* 8.4* 8.5* 8.5* 8.5* 8.7*  --   --  8.1* 7.5*  --  6.9*  MG 3.3* 3.0*  --   --  2.6*  --   --   --   --   --   --   --   PHOS  --  3.6 4.2 4.7*  --   --   --   --   --   --   --   --     Liver Function Tests: Recent Labs  Lab 08/16/18 0635 08/17/18 0805 08/18/18 0443 08/19/18 0545 08/21/18 1202  AST 45*  --   --   --  32  ALT 28  --   --   --  16  ALKPHOS 133*  --   --   --  88  BILITOT 1.2  --   --   --  1.8*  PROT 6.6  --   --   --  5.1*  ALBUMIN 2.2* 2.2* 2.2* 2.3* 2.2*   No results for input(s): LIPASE, AMYLASE in the last 168 hours. No results for input(s): AMMONIA in the last 168 hours.  CBC: Recent Labs  Lab 08/16/18 0635 08/17/18 0805 08/18/18 0443 08/19/18 0545 08/21/18 0232  08/21/18 1154 08/21/18 1202 08/21/18 2053 08/21/18 2125 08/22/18 0401  WBC 14.0* 13.5* 14.8* 17.5* 17.1*  --   --  16.9* 49.3*  --  47.6*  NEUTROABS 10.7* 9.8* 10.7*  --   --   --   --   --   --   --   --   HGB 9.3* 10.1* 9.7* 8.8* 9.0*   < > 13.6 13.2 11.7* 11.6* 11.6*  HCT 32.0* 32.7* 32.5* 29.2* 29.0*   < > 40.0 41.6 36.1 34.0* 36.3  MCV 85.8 84.9 84.6 85.9 82.6  --   --  87.0 85.5  --  85.6  PLT 233 241 246 275 217  --   --  187 136*  --  148*   < > = values in this interval not displayed.    Cardiac Enzymes: Recent Labs  Lab 08/21/18 1202 08/21/18 1925 08/22/18 0401  TROPONINI 0.05* 0.08* 0.11*    BNP: BNP (last 3 results) Recent Labs    08/13/18 1926  BNP 404.0*    ProBNP (last 3 results) No results for input(s):  PROBNP in the last 8760 hours.    Other results:  Imaging: Dg Chest Port 1 View  Result Date: 08/22/2018 CLINICAL DATA:  Respiratory failure. EXAM: PORTABLE CHEST 1 VIEW COMPARISON:  08/21/2018 FINDINGS: Endotracheal tube remains with the tip approximately 2 cm above the carina. Stable positioning of central line at the SVC/RA junction. Gastric decompression tube extends below the diaphragm. Stable appearance of biventricular pacer/ICD. There is some interval improved aeration at both lung bases with bibasilar atelectasis remaining and probable small bilateral pleural effusions. No pneumothorax or overt pulmonary edema. IMPRESSION: Improved aeration of both lung bases with bibasilar atelectasis remaining and probable small bilateral pleural effusions. Electronically Signed   By: Aletta Edouard M.D.   On: 08/22/2018 07:47   Dg Chest Port 1 View  Result Date: 08/21/2018 CLINICAL DATA:  Status post laparotomy for small bowel obstruction. EXAM: PORTABLE CHEST 1 VIEW COMPARISON:  Chest x-ray dated August 13, 2018. FINDINGS: Endotracheal tube tip 2.7 cm above the carina. Enteric tube in the stomach. Unchanged left chest wall pacemaker. Stable cardiomegaly. Atherosclerotic calcification of the aortic arch. Normal pulmonary vascularity. Right greater than left basilar atelectasis with small bilateral pleural effusions. No pneumothorax. No acute osseous abnormality. IMPRESSION: 1. Appropriately positioned endotracheal and enteric tubes. 2. Bibasilar atelectasis with small bilateral pleural effusions. Electronically Signed   By: Titus Dubin M.D.   On: 08/21/2018 12:09   Dg Chest Port 1v Same Day  Result Date: 08/21/2018 CLINICAL DATA:  Central line placement EXAM: PORTABLE CHEST 1 VIEW COMPARISON:  Portable exam 1626 hours compared to 1148 hours FINDINGS: Tip of endotracheal tube projects 3.9 cm above carina. Nasogastric tube extends into stomach. RIGHT jugular central venous catheter with tip projecting  over cavoatrial junction. LEFT subclavian AICD leads unchanged. Upper normal heart size. Atherosclerotic calcifications aorta. Bibasilar atelectasis and LEFT pleural effusion again seen. Upper lungs clear. No pneumothorax following central line placement.  Bones demineralized. IMPRESSION: No pneumothorax following central line placement. Persistent bibasilar atelectasis and LEFT pleural effusion. Electronically Signed   By: Lavonia Dana M.D.   On: 08/21/2018 16:51   Dg Abd Portable 1v  Result Date: 08/20/2018 CLINICAL DATA:  78 year old female with small bowel obstruction EXAM: PORTABLE ABDOMEN - 1 VIEW COMPARISON:  CT 08/19/2018 FINDINGS: Similar appearance of the dilated small bowel, with no significant change. Paucity of colonic gas. Gastric tube terminates left upper quadrant. IMPRESSION: Persisting small bowel obstruction without significant change. Gastric tube terminates in the left upper quadrant. Electronically Signed   By: Corrie Mckusick D.O.   On: 08/20/2018 13:38   [place] Ekg Site Rite  Result Date: 08/21/2018 If Site Rite image not attached, placement could not be confirmed due to current cardiac rhythm.     Medications:     Scheduled Medications: . chlorhexidine gluconate (MEDLINE KIT)  15 mL Mouth Rinse BID  . enoxaparin (LOVENOX) injection  30 mg Subcutaneous Q24H  . fentaNYL (SUBLIMAZE) injection  25 mcg Intravenous Once  . mouth rinse  15 mL Mouth Rinse 10 times per day  . metoprolol tartrate  2.5 mg Intravenous Q6H  . pantoprazole (PROTONIX) IV  40 mg Intravenous QHS     Infusions: . sodium chloride 50 mL/hr at 08/22/18 1000  . amiodarone 30 mg/hr (08/22/18 1000)  . fentaNYL infusion INTRAVENOUS 25 mcg/hr (08/22/18 1000)  . norepinephrine (LEVOPHED) Adult infusion 39 mcg/min (08/22/18 1000)  . piperacillin-tazobactam (ZOSYN)  IV 3.375 g (08/22/18 1116)  . vasopressin (PITRESSIN) infusion - *FOR SHOCK* 0.03 Units/min (08/22/18 1000)     PRN Medications:  acetaminophen  **OR** acetaminophen, ondansetron **OR** ondansetron (ZOFRAN) IV   Assessment:   Savannah Balan [name] a 78 y.o.femalewith a history of chronic combined systolic and diastolic CHF due to iCM with EF of 30% on Echo this admit s/p ICD, CAD with remote MI, hypertension, hyperlipidemia, CKD stage III, chronic anemia, and GERDwho underwent ex-lap for SBO on 4/30. Transferred to ICU post-op for severe hypotension and shock  Plan/Discussion:    1. Shock - with normal MV sat and low CVP suspect major issue is sepsis and hypovolemia and not cardiogenic - Continue gentle IVF and pressors. Wean NE as toelrated  - continue broad spectrum abx - follow cx  2. Acute hypoxic respiratory failure - due to #1 - CCM managing vent  3. SBO s/p ex lap with lysis of adhesions on 4/30 - GSU managing  4. AKI on CKD stage 4  - Creatinine peaked at 3.21 this admission on 08/13/2018. Creatinine 1.26 pre-op. Now going back up with ATN.Continue gentle volume support  5. Chronic systolic HF due to iCM - EF 30% s/p BIV-ICD (MDT) - co-ox ok. Volume low.  - continue pressors as needed. Follow co-ox and CVP  6. CAD - stable no s/s ischemia currently  7. Paroxysmal atrial tach - s/p DC-CV on 4/29 - keep atrial pacing at 90  - continue amio   8. RUE IV infiltrate with pressor agents - treated with regitine  - continue wound care  CRITICAL CARE Performed by: Glori Bickers  Total critical care time: 45 minutes  Critical care time was exclusive of separately billable procedures and treating other patients.  Critical care was necessary to treat or prevent imminent or life-threatening deterioration.  Critical care was time spent personally by me (independent of midlevel providers or residents) on the following activities: development of treatment plan with patient and/or surrogate as well as nursing, discussions  with consultants, evaluation of patient's response to treatment, examination of patient,  obtaining history from patient or surrogate, ordering and performing treatments and interventions, ordering and review of laboratory studies, ordering and review of radiographic studies, pulse oximetry and re-evaluation of patient's condition.      Length of Stay: 9   Glori Bickers  MD 08/22/2018, 11:37 AM  Advanced Heart Failure Team Pager [phone] (M-F; [place])  Please contact [facility] for night-coverage after hours (4p -7a ) and weekends on [email or web address]

## 2018-08-22 NOTE — Progress Notes (Signed)
Patient ID: [name], female   DOB: November 04, 1940, 78 y.o.   MRN: [id number] S/P LOA and SBR for SBO with dead bowel. RN notified me of events tonight. She has been persistently hypotensive and has made little urine. She required DC cardioversion by Cardiology and now they have reprogrammed her pacemaker. Abdomen is distended and quiet. In addition to her cardiac issues, she is septic from the dead bowel. Continue pressor support, Zosyn. I believe she is going into acute renal failure in the setting of CKD. I called her husband, [name], and explained what is going on and that she may require CRRT today. I answered his questions and our team will update him further later this AM.  Georganna Skeans, MD, MPH, [place] Trauma: [phone] General Surgery: [phone]

## 2018-08-22 NOTE — Progress Notes (Signed)
Initial Nutrition Assessment  DOCUMENTATION CODES:   Severe malnutrition in context of acute illness/injury  INTERVENTION:   If pt remains medically inappropriate for tube feeding/diet advancement recommend initiation of TPN as pt is day 9 without nutrition.   If TF warranted recommend:   -Trickle feedings of Vital High Protein @ 20 ml/hr via NGT -Increase per toleration to goal rate of 60 ml/hr (1440 ml)   At goal rate TF provides: 1440 kcals, 126 grams protein, 1201 ml free water.   NUTRITION DIAGNOSIS:   Severe Malnutrition related to acute illness(SBO) as evidenced by energy intake < or equal to 50% for > or equal to 5 days, mild fat depletion, moderate muscle depletion.  GOAL:   Patient will meet greater than or equal to 90% of their needs  MONITOR:   Diet advancement, Weight trends, Labs, Vent status, TF tolerance, Skin, I & O's  REASON FOR ASSESSMENT:   Ventilator    ASSESSMENT:   Patient with PMH significant for anemia, CAD s/p AICD placement, breast cancer, CKD III, CHF, HLD, impaired hearing, and RA. [facility] 4/[facility] with CAP and SBO.    4/28- CT scan showed persistent SBO, tx to [facility] for surgery 4/30- ex lap, lysis of adhesions, small bowel resection due to ischemic changes   Pt discussed during ICU rounds and with RN.    Pt failed SBT this am. Shocked yesterday for VT. Currently in septic shock and with ARF, may require CRRT. UOP minimal. Per CCM, bowel rest today. Pt is currently day 9 NPO. RD to leave TF recommendations. If unable to feed in near future consider TPN.   Per chart records, pt looks to have maintained a weight of 64-66 kg. Unsure if pt had unintentional wt loss PTA. Will need to obtain history if possible.   Admission wt: 66.7 kg. Current wt: 70.3 kg. Will utilize EDW of 64.8 kg to estimate needs.   Patient is currently intubated on ventilator support MV: 8.8 L/min Temp (24hrs), Avg:99 F (37.2 C), Min:98.5 F (36.9 C), Max:99.5  F (37.5 C) BP: 154/46 MAP:70  I/O: +3,864 since admit UOP: 305 ml x 24 hrs  NGT: 300 ml x 24 hrs  JP drain: 800 ml x 24 hrs   Drips: NS @ 50 ml/hr, amiodarone, levophed, vasopressin Labs: Na 148 (H) calcium ionized 1.09 (L)   Diet Order:   Diet Order            Diet NPO time specified  Diet effective now              EDUCATION NEEDS:   Not appropriate for education at this time  Skin:  Skin Assessment: Skin Integrity Issues: Skin Integrity Issues:: Stage II, Incisions Stage II: buttocks Incisions: abdomen  Last BM:  5/1  Height:   Ht Readings from Last 1 Encounters:  08/21/18 5\' 4"  (1.626 m)    Weight:   Wt Readings from Last 1 Encounters:  08/22/18 70.3 kg    Ideal Body Weight:  54.5 kg  BMI:  Body mass index is 26.6 kg/m.  Estimated Nutritional Needs:   Kcal:  1337 kcal  Protein:  105-125 grams  Fluid:  >/= 1.3 L/day    Mariana Single RD, LDN Clinical Nutrition Pager # - [phone]

## 2018-08-22 NOTE — Progress Notes (Signed)
Notified Icard DO that patient's CVP dropped from 4 to 1.  No new orders given.  Continue to monitor.

## 2018-08-22 NOTE — Consult Note (Addendum)
[place] Nurse wound consult note Reason for Consult: Consult requested for right arm infiltrate; which occurred on 4/30 with vasopressor IV fluids, according to progress notes.  Wound type: Right arm previously noted to have received Regitine and eventually developed blisters.  These have ruptured and evolved into partial thickness skin loss; affected area is approx 7X8X.1cm, red and moist, mod amt yellow drainage, no odor Dressing procedure/placement/frequency: Xeroform gauze to promote moist healing. Please re-consult if further assistance is needed.  Thank-you,  Julien Girt MSN, [place], [place], [place], [place]

## 2018-08-23 ENCOUNTER — Inpatient Hospital Stay (HOSPITAL_COMMUNITY): Payer: Medicare Other

## 2018-08-23 DIAGNOSIS — I471 Supraventricular tachycardia: Secondary | ICD-10-CM

## 2018-08-23 DIAGNOSIS — R0689 Other abnormalities of breathing: Secondary | ICD-10-CM

## 2018-08-23 DIAGNOSIS — N170 Acute kidney failure with tubular necrosis: Secondary | ICD-10-CM

## 2018-08-23 LAB — CBC
HCT: 28 % — ABNORMAL LOW (ref 36.0–46.0)
Hemoglobin: 9 g/dL — ABNORMAL LOW (ref 12.0–15.0)
MCH: 27.4 pg (ref 26.0–34.0)
MCHC: 32.1 g/dL (ref 30.0–36.0)
MCV: 85.4 fL (ref 80.0–100.0)
Platelets: 116 10*3/uL — ABNORMAL LOW (ref 150–400)
RBC: 3.28 MIL/uL — ABNORMAL LOW (ref 3.87–5.11)
RDW: 19.4 % — ABNORMAL HIGH (ref 11.5–15.5)
WBC: 31.8 10*3/uL — ABNORMAL HIGH (ref 4.0–10.5)
nRBC: 0.2 % (ref 0.0–0.2)

## 2018-08-23 LAB — BASIC METABOLIC PANEL
Anion gap: 12 (ref 5–15)
BUN: 57 mg/dL — ABNORMAL HIGH (ref 8–23)
CO2: 17 mmol/L — ABNORMAL LOW (ref 22–32)
Calcium: 7.4 mg/dL — ABNORMAL LOW (ref 8.9–10.3)
Chloride: 119 mmol/L — ABNORMAL HIGH (ref 98–111)
Creatinine, Ser: 2.92 mg/dL — ABNORMAL HIGH (ref 0.44–1.00)
GFR calc Af Amer: 17 mL/min — ABNORMAL LOW (ref >60)
GFR calc non Af Amer: 15 mL/min — ABNORMAL LOW (ref >60)
Glucose, Bld: 124 mg/dL — ABNORMAL HIGH (ref 70–99)
Potassium: 4.7 mmol/L (ref 3.5–5.1)
Sodium: 148 mmol/L — ABNORMAL HIGH (ref 135–145)

## 2018-08-23 LAB — MAGNESIUM: Magnesium: 1.9 mg/dL (ref 1.7–2.4)

## 2018-08-23 LAB — COOXEMETRY PANEL
Carboxyhemoglobin: 1.3 % (ref 0.5–1.5)
Methemoglobin: 1.1 % (ref 0.0–1.5)
O2 Saturation: 98.9 %
Total hemoglobin: 9.1 g/dL — ABNORMAL LOW (ref 12.0–16.0)

## 2018-08-23 LAB — TSH: TSH: 1.133 u[IU]/mL (ref 0.350–4.500)

## 2018-08-23 MED ORDER — LACTATED RINGERS IV SOLN
INTRAVENOUS | Status: DC
  Administered 2018-08-23: 04:00:00 via INTRAVENOUS

## 2018-08-23 MED ORDER — MAGNESIUM SULFATE 2 GM/50ML IV SOLN
2.0000 g | Freq: Once | INTRAVENOUS | Status: AC
  Administered 2018-08-23: 2 g via INTRAVENOUS
  Filled 2018-08-23: qty 50

## 2018-08-23 MED ORDER — MUPIROCIN 2 % EX OINT
TOPICAL_OINTMENT | Freq: Two times a day (BID) | CUTANEOUS | Status: DC
  Administered 2018-08-23 – 2018-08-27 (×8): via NASAL
  Administered 2018-08-27: 1 via NASAL
  Administered 2018-08-28 – 2018-08-29 (×3): via NASAL
  Administered 2018-08-29: 1 via NASAL
  Administered 2018-08-30: 2 via NASAL
  Administered 2018-08-30 – 2018-09-03 (×8): via NASAL
  Administered 2018-09-03: 1 via NASAL
  Administered 2018-09-04 – 2018-09-06 (×5): via NASAL
  Administered 2018-09-06: 1 via NASAL
  Administered 2018-09-07 – 2018-09-08 (×4): via NASAL
  Administered 2018-09-09: 1 via NASAL
  Administered 2018-09-09 – 2018-09-14 (×11): via NASAL
  Administered 2018-09-15: 1 via NASAL
  Administered 2018-09-15: 09:00:00 via NASAL
  Administered 2018-09-16: 1 via NASAL
  Administered 2018-09-16 – 2018-09-19 (×6): via NASAL
  Filled 2018-08-23 (×7): qty 22

## 2018-08-23 MED ORDER — PIPERACILLIN-TAZOBACTAM IN DEX 2-0.25 GM/50ML IV SOLN
2.2500 g | Freq: Three times a day (TID) | INTRAVENOUS | Status: AC
  Administered 2018-08-23 – 2018-08-30 (×23): 2.25 g via INTRAVENOUS
  Filled 2018-08-23 (×25): qty 50

## 2018-08-23 MED ORDER — AMIODARONE LOAD VIA INFUSION
150.0000 mg | Freq: Once | INTRAVENOUS | Status: AC
  Administered 2018-08-23: 150 mg via INTRAVENOUS
  Filled 2018-08-23: qty 83.34

## 2018-08-23 NOTE — Progress Notes (Addendum)
2 Days Post-Op  Subjective: Patients fentanyl drip and levo were stopped this morning. Still on Vasopressin. Weaning from vent. Did have BM overnight per nurse. Minimal urine output still.   Objective: Vital signs in last 24 hours: Temp:  [97.6 F (36.4 C)-98.2 F (36.8 C)] 97.6 F (36.4 C) (05/02 0400) Pulse Rate:  [88-101] 89 (05/02 0500) Resp:  [14-37] 18 (05/02 0500) BP: (81-135)/(24-65) 88/39 (05/02 0500) SpO2:  [97 %-100 %] 99 % (05/02 0500) Arterial Line BP: (94-171)/(28-56) 102/31 (05/02 0500) FiO2 (%):  [40 %] 40 % (05/02 0810) Last BM Date: 08/22/18  Intake/Output from previous day: 05/01 0701 - 05/02 0700 In: 1866.3 [I.V.:1675.5; IV Piggyback:190.9] Out: 356 [Urine:106; Emesis/NG output:100; Drains:150] Intake/Output this shift: Total I/O In: 65.3 [I.V.:45.6; IV Piggyback:19.7] Out: 20 [Urine:20]  Vent Mode: PSV;CPAP FiO2 (%):  [40 %] 40 % Set Rate:  [16 bmp] 16 bmp Vt Set:  [430 mL] 430 mL PEEP:  [5 cmH20] 5 cmH20 Pressure Support:  [5 cmH20] 5 cmH20 Plateau Pressure:  [11 cmH20-16 cmH20] 11 cmH20  PE: Gen: on vent, opens eyes to verbal commands  Heart: paced Lungs: CTAB on vent. Weaning, PEEP 5, O2 40% Abd: soft, minimal distention, +BS, midline wound is open and clean. See picture below.  NGT with minimal to no output currently. 100cc overnight. JP drain with 150cc of bloody/serosang output overnight      Lab Results:  Recent Labs    08/22/18 0401 08/23/18 0354  WBC 47.6* 31.8*  HGB 11.6* 9.0*  HCT 36.3 28.0*  PLT 148* 116*   BMET Recent Labs    08/22/18 0401 08/23/18 0354  NA 148* 148*  K 5.0 4.7  CL 119* 119*  CO2 16* 17*  GLUCOSE 111* 124*  BUN 44* 57*  CREATININE 2.02* 2.92*  CALCIUM 6.9* 7.4*   PT/INR No results for input(s): LABPROT, INR in the last 72 hours. CMP     Component Value Date/Time   NA 148 (H) 08/23/2018 0354   K 4.7 08/23/2018 0354   CL 119 (H) 08/23/2018 0354   CO2 17 (L) 08/23/2018 0354   GLUCOSE  124 (H) 08/23/2018 0354   BUN 57 (H) 08/23/2018 0354   CREATININE 2.92 (H) 08/23/2018 0354   CREATININE 1.07 (H) 02/13/2018 1142   CALCIUM 7.4 (L) 08/23/2018 0354   PROT 5.1 (L) 08/21/2018 1202   ALBUMIN 2.2 (L) 08/21/2018 1202   AST 32 08/21/2018 1202   ALT 16 08/21/2018 1202   ALKPHOS 88 08/21/2018 1202   BILITOT 1.8 (H) 08/21/2018 1202   GFRNONAA 15 (L) 08/23/2018 0354   GFRNONAA 49 (L) 02/13/2018 1142   GFRAA 17 (L) 08/23/2018 0354   GFRAA 57 (L) 02/13/2018 1142   Lipase     Component Value Date/Time   LIPASE 30 08/13/2018 1926       Studies/Results: Dg Chest Port 1 View  Result Date: 08/23/2018 CLINICAL DATA:  Acute respiratory failure. EXAM: PORTABLE CHEST 1 VIEW COMPARISON:  Chest x-ray from yesterday. FINDINGS: Unchanged endotracheal and enteric tubes. Unchanged right internal jugular central venous catheter. Unchanged left chest wall pacemaker. Stable cardiomegaly. Normal pulmonary vascularity. Unchanged mild bibasilar atelectasis and small bilateral pleural effusions. No consolidation or pneumothorax. No acute osseous abnormality. IMPRESSION: 1. Unchanged mild bibasilar atelectasis and small bilateral pleural effusions. Electronically Signed   By: Titus Dubin M.D.   On: 08/23/2018 07:16   Dg Chest Port 1 View  Result Date: 08/22/2018 CLINICAL DATA:  Respiratory failure. EXAM: PORTABLE CHEST 1  VIEW COMPARISON:  08/21/2018 FINDINGS: Endotracheal tube remains with the tip approximately 2 cm above the carina. Stable positioning of central line at the SVC/RA junction. Gastric decompression tube extends below the diaphragm. Stable appearance of biventricular pacer/ICD. There is some interval improved aeration at both lung bases with bibasilar atelectasis remaining and probable small bilateral pleural effusions. No pneumothorax or overt pulmonary edema. IMPRESSION: Improved aeration of both lung bases with bibasilar atelectasis remaining and probable small bilateral pleural  effusions. Electronically Signed   By: Aletta Edouard M.D.   On: 08/22/2018 07:47   Dg Chest Port 1 View  Result Date: 08/21/2018 CLINICAL DATA:  Status post laparotomy for small bowel obstruction. EXAM: PORTABLE CHEST 1 VIEW COMPARISON:  Chest x-ray dated August 13, 2018. FINDINGS: Endotracheal tube tip 2.7 cm above the carina. Enteric tube in the stomach. Unchanged left chest wall pacemaker. Stable cardiomegaly. Atherosclerotic calcification of the aortic arch. Normal pulmonary vascularity. Right greater than left basilar atelectasis with small bilateral pleural effusions. No pneumothorax. No acute osseous abnormality. IMPRESSION: 1. Appropriately positioned endotracheal and enteric tubes. 2. Bibasilar atelectasis with small bilateral pleural effusions. Electronically Signed   By: Titus Dubin M.D.   On: 08/21/2018 12:09   Dg Chest Port 1v Same Day  Result Date: 08/21/2018 CLINICAL DATA:  Central line placement EXAM: PORTABLE CHEST 1 VIEW COMPARISON:  Portable exam 1626 hours compared to 1148 hours FINDINGS: Tip of endotracheal tube projects 3.9 cm above carina. Nasogastric tube extends into stomach. RIGHT jugular central venous catheter with tip projecting over cavoatrial junction. LEFT subclavian AICD leads unchanged. Upper normal heart size. Atherosclerotic calcifications aorta. Bibasilar atelectasis and LEFT pleural effusion again seen. Upper lungs clear. No pneumothorax following central line placement. Bones demineralized. IMPRESSION: No pneumothorax following central line placement. Persistent bibasilar atelectasis and LEFT pleural effusion. Electronically Signed   By: Lavonia Dana M.D.   On: 08/21/2018 16:51   [place] Ekg Site Rite  Result Date: 08/21/2018 If Site Rite image not attached, placement could not be confirmed due to current cardiac rhythm.   Anti-infectives: Anti-infectives (From admission, onward)   Start     Dose/Rate Route Frequency Ordered Stop   08/21/18 1200   piperacillin-tazobactam (ZOSYN) IVPB 3.375 g     3.375 g 12.5 mL/hr over 240 Minutes Intravenous Every 8 hours 08/21/18 1102     08/14/18 1000  hydroxychloroquine (PLAQUENIL) tablet 200 mg  Status:  Discontinued    Note to Pharmacy:  Take 1 tablet by mouth daily Monday through Friday only     200 mg Oral Once per day on Mon Tue Wed Thu Fri 08/14/18 1324 08/21/18 1147   08/13/18 2200  doxycycline (VIBRA-TABS) tablet 100 mg  Status:  Discontinued     100 mg Oral Every 12 hours 08/13/18 2134 08/13/18 2135   08/13/18 2200  doxycycline (VIBRAMYCIN) 100 mg in sodium chloride 0.9 % 250 mL IVPB  Status:  Discontinued     100 mg 125 mL/hr over 120 Minutes Intravenous Every 12 hours 08/13/18 2135 08/19/18 1702   08/13/18 2145  cefTRIAXone (ROCEPHIN) 1 g in sodium chloride 0.9 % 100 mL IVPB  Status:  Discontinued     1 g 200 mL/hr over 30 Minutes Intravenous Every 24 hours 08/13/18 2134 08/14/18 0743       Assessment/Plan  POD 2, s/p ex lap with SBR for SBO and ischemic bowel -Off levo. Weaning from vent. Appreciate CCM input  -Cont bowel rest for now. Another BM overnight and minimal NGT output,  will discuss with MD pulling this -NS WTD dressing changes to midline wound BID -cont JP drain for now  -cont zosyn  Acute on chronic renal failure -cont foley for accurate I&Os -minimal UOP - Cr 2.92 -cont fluids, but has a history of CHF as well.  Defer fluid balance to CCM  ICD/CAD/CHF -per cardiology.  -on amio  VDRF -per CCM  FEN - NPO/NGT/IVFs VTE - Lovenox/SCD ID - zosyn 4/30 >> WBC down from 47 to 31.8   LOS: 10 days    Jillyn Ledger , [facility] 08/23/2018, 8:40 AM Pager: [phone]

## 2018-08-23 NOTE — Progress Notes (Signed)
Cardiology Rounding Note   Subjective:    Remains intubated. Awake on vent.   Off NE. On vasopression. MAPs in 10s. No urine output. Creatinine up to 2.9.   Rhythm stable on IV amio  Co-ox 99% (inaccurate)  Objective:   Weight Range:  Vital Signs:   Temp:  [97.6 F (36.4 C)-98.2 F (36.8 C)] 97.6 F (36.4 C) (05/02 0400) Pulse Rate:  [88-101] 89 (05/02 0500) Resp:  [14-37] 18 (05/02 0500) BP: (81-135)/(24-65) 88/39 (05/02 0500) SpO2:  [97 %-100 %] 99 % (05/02 0500) Arterial Line BP: (94-171)/(28-56) 102/31 (05/02 0500) FiO2 (%):  [40 %] 40 % (05/02 0810) Last BM Date: 08/22/18  Weight change: Filed Weights   08/14/18 0017 08/14/18 0500 08/22/18 0625  Weight: 64.8 kg 64.8 kg 70.3 kg    Intake/Output:   Intake/Output Summary (Last 24 hours) at 08/23/2018 0848 Last data filed at 08/23/2018 0800 Gross per 24 hour  Intake 1828.63 ml  Output 314 ml  Net 1514.63 ml     Physical Exam: General:  Elderly frail. Awake on vent  HEENT: normal + ETT Neck: supple. no JVD. RIJ TLV  Carotids 2+ bilat; no bruits. No lymphadenopathy or thryomegaly appreciated. Cor: PMI nondisplaced. Regular rate & rhythm. No rubs, gallops or murmurs. Lungs: clear Abdomen: soft, nontender, + distended. Surgical dressing C/D/I Extremities: no cyanosis, clubbing, rash, 2+ edema + foley  LUE wrapped. + draining wounds  Neuro: awake on vent. Moves all 4s   Telemetry: AV pacing 90s Personally reviewed   Labs: Basic Metabolic Panel: Recent Labs  Lab 08/17/18 0805 08/18/18 0443 08/19/18 0545 08/20/18 1248 08/21/18 0232 08/21/18 0916  08/21/18 1202 08/21/18 2053 08/21/18 2125 08/22/18 0401 08/23/18 0354  NA 144 142 142 146* 146* 150*   < > 147* 149* 148* 148* 148*  K 3.6 3.9 4.5 4.1 3.9 3.7   < > 4.5 5.0 5.3* 5.0 4.7  CL 113* 114* 113* 115* 117*  --   --  122* 120*  --  119* 119*  CO2 20* 18* 16* 17* 15*  --   --  16* 15*  --  16* 17*  GLUCOSE 89 95 91 74 67* 74  --  90 90  --  111*  124*  BUN 57* 63* 68* 52* 41*  --   --  42* 45*  --  44* 57*  CREATININE 1.15* 1.16* 1.29* 1.18* 1.01*  --   --  1.26* 1.89*  --  2.02* 2.92*  CALCIUM 8.4* 8.5* 8.5* 8.5* 8.7*  --   --  8.1* 7.5*  --  6.9* 7.4*  MG 3.0*  --   --  2.6*  --   --   --   --   --   --   --   --   PHOS 3.6 4.2 4.7*  --   --   --   --   --   --   --   --   --    < > = values in this interval not displayed.    Liver Function Tests: Recent Labs  Lab 08/17/18 0805 08/18/18 0443 08/19/18 0545 08/21/18 1202  AST  --   --   --  32  ALT  --   --   --  16  ALKPHOS  --   --   --  88  BILITOT  --   --   --  1.8*  PROT  --   --   --  5.1*  ALBUMIN 2.2* 2.2* 2.3* 2.2*   No results for input(s): LIPASE, AMYLASE in the last 168 hours. No results for input(s): AMMONIA in the last 168 hours.  CBC: Recent Labs  Lab 08/17/18 0805 08/18/18 0443  08/21/18 0232  08/21/18 1202 08/21/18 2053 08/21/18 2125 08/22/18 0401 08/23/18 0354  WBC 13.5* 14.8*   < > 17.1*  --  16.9* 49.3*  --  47.6* 31.8*  NEUTROABS 9.8* 10.7*  --   --   --   --   --   --   --   --   HGB 10.1* 9.7*   < > 9.0*   < > 13.2 11.7* 11.6* 11.6* 9.0*  HCT 32.7* 32.5*   < > 29.0*   < > 41.6 36.1 34.0* 36.3 28.0*  MCV 84.9 84.6   < > 82.6  --  87.0 85.5  --  85.6 85.4  PLT 241 246   < > 217  --  187 136*  --  148* 116*   < > = values in this interval not displayed.    Cardiac Enzymes: Recent Labs  Lab 08/21/18 1202 08/21/18 1925 08/22/18 0401  TROPONINI 0.05* 0.08* 0.11*    BNP: BNP (last 3 results) Recent Labs    08/13/18 1926  BNP 404.0*    ProBNP (last 3 results) No results for input(s): PROBNP in the last 8760 hours.    Other results:  Imaging: Dg Chest Port 1 View  Result Date: 08/23/2018 CLINICAL DATA:  Acute respiratory failure. EXAM: PORTABLE CHEST 1 VIEW COMPARISON:  Chest x-ray from yesterday. FINDINGS: Unchanged endotracheal and enteric tubes. Unchanged right internal jugular central venous catheter. Unchanged left  chest wall pacemaker. Stable cardiomegaly. Normal pulmonary vascularity. Unchanged mild bibasilar atelectasis and small bilateral pleural effusions. No consolidation or pneumothorax. No acute osseous abnormality. IMPRESSION: 1. Unchanged mild bibasilar atelectasis and small bilateral pleural effusions. Electronically Signed   By: Titus Dubin M.D.   On: 08/23/2018 07:16   Dg Chest Port 1 View  Result Date: 08/22/2018 CLINICAL DATA:  Respiratory failure. EXAM: PORTABLE CHEST 1 VIEW COMPARISON:  08/21/2018 FINDINGS: Endotracheal tube remains with the tip approximately 2 cm above the carina. Stable positioning of central line at the SVC/RA junction. Gastric decompression tube extends below the diaphragm. Stable appearance of biventricular pacer/ICD. There is some interval improved aeration at both lung bases with bibasilar atelectasis remaining and probable small bilateral pleural effusions. No pneumothorax or overt pulmonary edema. IMPRESSION: Improved aeration of both lung bases with bibasilar atelectasis remaining and probable small bilateral pleural effusions. Electronically Signed   By: Aletta Edouard M.D.   On: 08/22/2018 07:47   Dg Chest Port 1 View  Result Date: 08/21/2018 CLINICAL DATA:  Status post laparotomy for small bowel obstruction. EXAM: PORTABLE CHEST 1 VIEW COMPARISON:  Chest x-ray dated August 13, 2018. FINDINGS: Endotracheal tube tip 2.7 cm above the carina. Enteric tube in the stomach. Unchanged left chest wall pacemaker. Stable cardiomegaly. Atherosclerotic calcification of the aortic arch. Normal pulmonary vascularity. Right greater than left basilar atelectasis with small bilateral pleural effusions. No pneumothorax. No acute osseous abnormality. IMPRESSION: 1. Appropriately positioned endotracheal and enteric tubes. 2. Bibasilar atelectasis with small bilateral pleural effusions. Electronically Signed   By: Titus Dubin M.D.   On: 08/21/2018 12:09   Dg Chest Port 1v Same Day   Result Date: 08/21/2018 CLINICAL DATA:  Central line placement EXAM: PORTABLE CHEST 1 VIEW COMPARISON:  Portable exam 1626 hours compared to 1148 hours  FINDINGS: Tip of endotracheal tube projects 3.9 cm above carina. Nasogastric tube extends into stomach. RIGHT jugular central venous catheter with tip projecting over cavoatrial junction. LEFT subclavian AICD leads unchanged. Upper normal heart size. Atherosclerotic calcifications aorta. Bibasilar atelectasis and LEFT pleural effusion again seen. Upper lungs clear. No pneumothorax following central line placement. Bones demineralized. IMPRESSION: No pneumothorax following central line placement. Persistent bibasilar atelectasis and LEFT pleural effusion. Electronically Signed   By: Lavonia Dana M.D.   On: 08/21/2018 16:51   [place] Ekg Site Rite  Result Date: 08/21/2018 If Site Rite image not attached, placement could not be confirmed due to current cardiac rhythm.    Medications:     Scheduled Medications: . chlorhexidine gluconate (MEDLINE KIT)  15 mL Mouth Rinse BID  . enoxaparin (LOVENOX) injection  30 mg Subcutaneous Q24H  . fentaNYL (SUBLIMAZE) injection  25 mcg Intravenous Once  . mouth rinse  15 mL Mouth Rinse 10 times per day  . pantoprazole (PROTONIX) IV  40 mg Intravenous QHS    Infusions: . amiodarone 30 mg/hr (08/23/18 0804)  . fentaNYL infusion INTRAVENOUS Stopped (08/23/18 0721)  . lactated ringers Stopped (08/23/18 0453)  . norepinephrine (LEVOPHED) Adult infusion Stopped (08/22/18 1901)  . piperacillin-tazobactam (ZOSYN)  IV 12.5 mL/hr at 08/23/18 0600  . vasopressin (PITRESSIN) infusion - *FOR SHOCK* Stopped (08/23/18 5809)    PRN Medications: acetaminophen **OR** acetaminophen, ondansetron **OR** ondansetron (ZOFRAN) IV   Assessment:   Odessie Polzin [name] a 78 y.o.femalewith a history of chronic combined systolic and diastolic CHF due to iCM with EF of 30% on Echo this admit s/p ICD, CAD with remote MI, hypertension,  hyperlipidemia, CKD stage III, chronic anemia, and GERDwho underwent ex-lap for SBO on 4/30. Transferred to ICU post-op for severe hypotension and shock  Plan/Discussion:    1. Shock - with normal MV sat and low CVP suspect major issue is sepsis and hypovolemia and not cardiogenic - Continue gentle IVF and pressors. Now off NE. On vasopressin. Wean as tolerated  - continue broad spectrum abx - follow cx - d/w CCM at bedside   2. Acute hypoxic respiratory failure - due to #1 - CCM managing vent. On PS today  3. SBO s/p ex lap with lysis of adhesions on 4/30 - GSU managing  4. AKI on CKD stage 4  - Creatinine peaked at 3.21 this admission on 08/13/2018. Creatinine 1.26 pre-op. Now going back up with ATN. Continue gentle volume support. May wind up needing CVVHD. No indication for HD today  5. Chronic systolic HF due to iCM - EF 30% s/p BIV-ICD (MDT) - co-ox ok. Volume low.  - continue pressors as needed. Follow co-ox and CVP  6. CAD - stable no s/s ischemia currently  7. Paroxysmal atrial tach - s/p DC-CV on 4/29. Maintaining NSR  - keep atrial pacing at 90  - continue amio   8. RUE IV infiltrate with pressor agents - treated with regitine  - continue wound care  CRITICAL CARE Performed by: Glori Bickers  Total critical care time: 35 minutes  Critical care time was exclusive of separately billable procedures and treating other patients.  Critical care was necessary to treat or prevent imminent or life-threatening deterioration.  Critical care was time spent personally by me (independent of midlevel providers or residents) on the following activities: development of treatment plan with patient and/or surrogate as well as nursing, discussions with consultants, evaluation of patient's response to treatment, examination of patient, obtaining history from patient or  surrogate, ordering and performing treatments and interventions, ordering and review of laboratory  studies, ordering and review of radiographic studies, pulse oximetry and re-evaluation of patient's condition.      Length of Stay: 10   Glori Bickers  MD 08/23/2018, 8:48 AM  Advanced Heart Failure Team Pager [phone] (M-F; 7a - 4p)  Please contact [facility] for night-coverage after hours (4p -7a ) and weekends on [email or web address]

## 2018-08-23 NOTE — Progress Notes (Signed)
Pharmacy Antibiotic Note  [name] is a 78 y.o. female admitted on 08/13/2018 with SBO now s/p ex lap. Pharmacy has been consulted for Zosyn dosing with concern for peritonitis. Initial blood cultures negative and pt completed 7-day course of doxycycline for PNA. Cr stable, WBC up.  Creatinine continues to worsen, will reduce Zosyn dosing.  Plan: Reduce Zosyn to 2.25g IV q8h  Height: 5\' 4"  (162.6 cm) Weight: 154 lb 15.7 oz (70.3 kg) IBW/kg (Calculated) : 54.7  Temp (24hrs), Avg:97.9 F (36.6 C), Min:97.6 F (36.4 C), Max:98.2 F (36.8 C)  Recent Labs  Lab 08/21/18 0232 08/21/18 1202 08/21/18 1925 08/21/18 2053 08/21/18 2054 08/22/18 0401 08/23/18 0354  WBC 17.1* 16.9*  --  49.3*  --  47.6* 31.8*  CREATININE 1.01* 1.26*  --  1.89*  --  2.02* 2.92*  LATICACIDVEN  --   --  3.8*  --  4.8*  --   --     Estimated Creatinine Clearance: 15 mL/min (A) (by C-G formula based on SCr of 2.92 mg/dL (H)).    Allergies  Allergen Reactions  . Macrobid [Nitrofurantoin Macrocrystal] Hives and Itching    Antimicrobials this admission: Zosyn 4/30 >>   Microbiology results: 4/22 UCx: negative 4/22 BCx: negative 4/29 MRSA: positive   Thank you for allowing pharmacy to be a part of this patient's care.   Arrie Senate, PharmD, BCPS Clinical Pharmacist [phone] Please check AMION for all [facility] numbers 08/23/2018

## 2018-08-23 NOTE — Progress Notes (Addendum)
NAME:  [name], MRN:  [id number], DOB:  [birth year], LOS: 73 ADMISSION DATE:  08/13/2018, CONSULTATION DATE:  4/20 REFERRING MD:  Renne Crigler , CHIEF COMPLAINT:  Vent management    Brief History   78 year old female with significant cardiac history that is post exploratory laparotomy for small bowel obstruction with associated ischemic bowel on 4/30, return to the intensive care on mechanical ventilator, critical care asked to assist with care  Past Medical History  Congestive heart failure with EF 30%, nonsustained VT, pericardial effusion CAD w/ prior AICD, diastolic heart failure, CKD stage III, hypertension, rheumatoid arthritis, anemia of chronic disease.  Significant Hospital Events   4/22 Admitted to [facility] with small bowel obstruction.  Also started on antibiotics for possible PNA 4/28 ongoing high-grade bowel obstruction 4/29 arrived at [facility] 4/30 Ex-lap with lysis of adhesion, bowel resection for ischemic bowel return to ICU on ventilator 4/30 Shocked for VT 5/01 Remains on 40 mcg levophed, CVP 4, AKI, less JP drainage.    Consults:  Radiology 4/29  Procedures:  R Fem ALine 4/30 >>   Significant Diagnostic Tests:  CT imaging 4/29 >> Persistent small bowel obstruction  Micro Data:  BCx2 4/22 >> neg UC 4/22 >> neg  Antimicrobials:  Doxy 4/22 >> 4/28 Zosyn 4/30 >>  Interim history/subjective:  CVP 5, more swollen today per RN.  Vasopressin restarted, amiodarone.   Objective   Blood pressure (!) 88/39, pulse 89, temperature 97.6 F (36.4 C), temperature source Oral, resp. rate 18, height 5\' 4"  (1.626 m), weight 70.3 kg, SpO2 99 %. CVP:  [1 mmHg-6 mmHg] 6 mmHg  Vent Mode: PSV;CPAP FiO2 (%):  [40 %] 40 % Set Rate:  [16 bmp] 16 bmp Vt Set:  [430 mL] 430 mL PEEP:  [5 cmH20] 5 cmH20 Pressure Support:  [5 cmH20] 5 cmH20 Plateau Pressure:  [11 cmH20-16 cmH20] 11 cmH20   Intake/Output Summary (Last 24 hours) at 08/23/2018 0919 Last data filed at 08/23/2018 0800 Gross  per 24 hour  Intake 1796.34 ml  Output 314 ml  Net 1482.34 ml   Filed Weights   08/14/18 0017 08/14/18 0500 08/22/18 0625  Weight: 64.8 kg 64.8 kg 70.3 kg    Examination: General: elderly female lying in bed on vent in NAD HEENT: MM pink/moist, no jvd Neuro: opens eyes to voice, drifts back to sleep CV: s1s2 rrr, V-Paced rhythm  PULM: even/non-labored, lungs bilaterally clear  GI: soft, midline abd dressing c/d/i, JP drain with serosanguinous drainage  Extremities: warm/dry, 1-2+ generalized pitting edema  Skin: no rashes or lesions  Resolved Hospital Problem list   RLL PNA - treated with 7 days doxycycline   Assessment & Plan:    Small Bowel Obstruction s/p Exploratory Laparotomy -initial admit 4/22 at [facility], failed conservative therapy  P: Post-op care / feeding per CCS Continue zosyn, D3/x   Acute Respiratory Insufficiency  -post op exploratory laparotomy  Small R Effusion / Atelectasis P: PRVC 8cc/kg as rest mode  Wean PEEP / FiO2 for sats >90% PSV wean as able  Follow intermittent CXR   Shock  -?ABL, +/- sepsis with ischemic bowel, volume depletion -immune suppressed on Plaquenil for RA  P: Vasopressin gtt for SBP > 100 Follow CVP  Chronic Systolic / Diastolic CHF  -LVEF 91-63%, diffuse hypokinesis 08/19/18, AICD in place AF/Flutter, VT -s/p cardioversion for wide complex tachycardia 4/30 pm P: Tele monitoring  Appreciate CHF service input  Continue amiodarone IV  Hold lopressor   AKI, Oliguria  CKD  III  P: Trend BMP / urinary output Replace electrolytes as indicated Avoid nephrotoxic agents, ensure adequate renal perfusion Follow sr cr, no indication for acute HD at this point  At Risk Malnutrition  P: Feeding per CCS  RA on Plaquenil P: Hold home plaquenil   Anemia -ABL + ACD P: Follow CBC  Transfuse per ICU guidelines   Best practice:  Diet: NPO  Pain/Anxiety/Delirium protocol (if indicated): PRN fentanyl   VAP protocol (if  indicated): in place  DVT prophylaxis: SCD's, lovenox  GI prophylaxis: PPI Glucose control: n/a Mobility: bedrest  Code Status: Full Code  Family Communication: Husband updated 5/2 on plan of care.  Disposition: ICU  Labs   CBC: Recent Labs  Lab 08/17/18 0805 08/18/18 0443  08/21/18 0232  08/21/18 1202 08/21/18 2053 08/21/18 2125 08/22/18 0401 08/23/18 0354  WBC 13.5* 14.8*   < > 17.1*  --  16.9* 49.3*  --  47.6* 31.8*  NEUTROABS 9.8* 10.7*  --   --   --   --   --   --   --   --   HGB 10.1* 9.7*   < > 9.0*   < > 13.2 11.7* 11.6* 11.6* 9.0*  HCT 32.7* 32.5*   < > 29.0*   < > 41.6 36.1 34.0* 36.3 28.0*  MCV 84.9 84.6   < > 82.6  --  87.0 85.5  --  85.6 85.4  PLT 241 246   < > 217  --  187 136*  --  148* 116*   < > = values in this interval not displayed.    Basic Metabolic Panel: Recent Labs  Lab 08/17/18 0805 08/18/18 0443 08/19/18 0545 08/20/18 1248 08/21/18 0232 08/21/18 0916  08/21/18 1202 08/21/18 2053 08/21/18 2125 08/22/18 0401 08/23/18 0354  NA 144 142 142 146* 146* 150*   < > 147* 149* 148* 148* 148*  K 3.6 3.9 4.5 4.1 3.9 3.7   < > 4.5 5.0 5.3* 5.0 4.7  CL 113* 114* 113* 115* 117*  --   --  122* 120*  --  119* 119*  CO2 20* 18* 16* 17* 15*  --   --  16* 15*  --  16* 17*  GLUCOSE 89 95 91 74 67* 74  --  90 90  --  111* 124*  BUN 57* 63* 68* 52* 41*  --   --  42* 45*  --  44* 57*  CREATININE 1.15* 1.16* 1.29* 1.18* 1.01*  --   --  1.26* 1.89*  --  2.02* 2.92*  CALCIUM 8.4* 8.5* 8.5* 8.5* 8.7*  --   --  8.1* 7.5*  --  6.9* 7.4*  MG 3.0*  --   --  2.6*  --   --   --   --   --   --   --   --   PHOS 3.6 4.2 4.7*  --   --   --   --   --   --   --   --   --    < > = values in this interval not displayed.   GFR: Estimated Creatinine Clearance: 15 mL/min (A) (by C-G formula based on SCr of 2.92 mg/dL (H)). Recent Labs  Lab 08/21/18 1202 08/21/18 1925 08/21/18 2053 08/21/18 2054 08/22/18 0401 08/23/18 0354  WBC 16.9*  --  49.3*  --  47.6* 31.8*   LATICACIDVEN  --  3.8*  --  4.8*  --   --  Liver Function Tests: Recent Labs  Lab 08/17/18 0805 08/18/18 0443 08/19/18 0545 08/21/18 1202  AST  --   --   --  32  ALT  --   --   --  16  ALKPHOS  --   --   --  88  BILITOT  --   --   --  1.8*  PROT  --   --   --  5.1*  ALBUMIN 2.2* 2.2* 2.3* 2.2*   No results for input(s): LIPASE, AMYLASE in the last 168 hours. No results for input(s): AMMONIA in the last 168 hours.  ABG    Component Value Date/Time   PHART 7.396 08/21/2018 2125   PCO2ART 22.7 (L) 08/21/2018 2125   PO2ART 133.0 (H) 08/21/2018 2125   HCO3 14.0 (L) 08/21/2018 2125   TCO2 15 (L) 08/21/2018 2125   ACIDBASEDEF 9.0 (H) 08/21/2018 2125   O2SAT 98.9 08/23/2018 0635     Coagulation Profile: No results for input(s): INR, PROTIME in the last 168 hours.  Cardiac Enzymes: Recent Labs  Lab 08/21/18 1202 08/21/18 1925 08/22/18 0401  TROPONINI 0.05* 0.08* 0.11*    HbA1C: No results found for: HGBA1C  CBG: Recent Labs  Lab 08/21/18 1200  GLUCAP 82      Critical care time: 40 minutes    Noe Gens, NP-C [facility] Pgr: [phone] or if no answer [phone] 08/23/2018, 9:19 AM

## 2018-08-24 LAB — CBC
HCT: 25.1 % — ABNORMAL LOW (ref 36.0–46.0)
Hemoglobin: 8.2 g/dL — ABNORMAL LOW (ref 12.0–15.0)
MCH: 27.1 pg (ref 26.0–34.0)
MCHC: 32.7 g/dL (ref 30.0–36.0)
MCV: 82.8 fL (ref 80.0–100.0)
Platelets: 102 10*3/uL — ABNORMAL LOW (ref 150–400)
RBC: 3.03 MIL/uL — ABNORMAL LOW (ref 3.87–5.11)
RDW: 20 % — ABNORMAL HIGH (ref 11.5–15.5)
WBC: 25.4 10*3/uL — ABNORMAL HIGH (ref 4.0–10.5)
nRBC: 0 % (ref 0.0–0.2)

## 2018-08-24 LAB — COMPREHENSIVE METABOLIC PANEL
ALT: 10 U/L (ref 0–44)
AST: 55 U/L — ABNORMAL HIGH (ref 15–41)
Albumin: 1.3 g/dL — ABNORMAL LOW (ref 3.5–5.0)
Alkaline Phosphatase: 201 U/L — ABNORMAL HIGH (ref 38–126)
Anion gap: 12 (ref 5–15)
BUN: 68 mg/dL — ABNORMAL HIGH (ref 8–23)
CO2: 16 mmol/L — ABNORMAL LOW (ref 22–32)
Calcium: 7.6 mg/dL — ABNORMAL LOW (ref 8.9–10.3)
Chloride: 119 mmol/L — ABNORMAL HIGH (ref 98–111)
Creatinine, Ser: 3.21 mg/dL — ABNORMAL HIGH (ref 0.44–1.00)
GFR calc Af Amer: 15 mL/min — ABNORMAL LOW (ref >60)
GFR calc non Af Amer: 13 mL/min — ABNORMAL LOW (ref >60)
Glucose, Bld: 93 mg/dL (ref 70–99)
Potassium: 4.4 mmol/L (ref 3.5–5.1)
Sodium: 147 mmol/L — ABNORMAL HIGH (ref 135–145)
Total Bilirubin: 1.8 mg/dL — ABNORMAL HIGH (ref 0.3–1.2)
Total Protein: 4.6 g/dL — ABNORMAL LOW (ref 6.5–8.1)

## 2018-08-24 LAB — COOXEMETRY PANEL
Carboxyhemoglobin: 0.8 % (ref 0.5–1.5)
Methemoglobin: 2 % — ABNORMAL HIGH (ref 0.0–1.5)
O2 Saturation: 99.4 %
Total hemoglobin: 8.3 g/dL — ABNORMAL LOW (ref 12.0–16.0)

## 2018-08-24 MED ORDER — ORAL CARE MOUTH RINSE
15.0000 mL | Freq: Two times a day (BID) | OROMUCOSAL | Status: DC
  Administered 2018-08-25 – 2018-09-19 (×44): 15 mL via OROMUCOSAL

## 2018-08-24 MED ORDER — HEPARIN SODIUM (PORCINE) 5000 UNIT/ML IJ SOLN
5000.0000 [IU] | Freq: Three times a day (TID) | INTRAMUSCULAR | Status: DC
  Administered 2018-08-24 – 2018-08-28 (×11): 5000 [IU] via SUBCUTANEOUS
  Filled 2018-08-24 (×12): qty 1

## 2018-08-24 MED ORDER — CHLORHEXIDINE GLUCONATE 0.12 % MT SOLN
15.0000 mL | Freq: Two times a day (BID) | OROMUCOSAL | Status: DC
  Administered 2018-08-25 – 2018-09-19 (×48): 15 mL via OROMUCOSAL
  Filled 2018-08-24 (×48): qty 15

## 2018-08-24 MED ORDER — ORAL CARE MOUTH RINSE: 15.0000 mL | Freq: Two times a day (BID) | OROMUCOSAL | Status: DC

## 2018-08-24 NOTE — Procedures (Signed)
Extubation Procedure Note  Patient Details:   Name: [name] DOB: 08-Apr-1941 MRN: [id number]   Airway Documentation:    Vent end date: 08/24/18 Vent end time: 1030   Evaluation  O2 sats: stable throughout Complications: No apparent complications Patient did tolerate procedure well. Bilateral Breath Sounds: Clear, Diminished   Yes   Leak test positive.  No stridor noted  Diana Armijo 08/24/2018, 12:05 PM

## 2018-08-24 NOTE — Progress Notes (Signed)
3 Days Post-Op  Subjective: All pressors have been weaned off this morning. Weaning from vent. BM 2 nights ago? None since.  Objective: Vital signs in last 24 hours: Temp:  [96.6 F (35.9 C)-97.6 F (36.4 C)] 97.6 F (36.4 C) (05/03 0800) Pulse Rate:  [88-107] 89 (05/03 0800) Resp:  [10-26] 14 (05/03 0800) BP: (90-144)/(43-64) 113/56 (05/03 0800) SpO2:  [99 %-100 %] 100 % (05/03 0800) Arterial Line BP: (91-134)/(33-47) 134/43 (05/03 0800) FiO2 (%):  [40 %] 40 % (05/03 0814) Weight:  [72.8 kg] 72.8 kg (05/03 0500) Last BM Date: 08/22/18  Intake/Output from previous day: 05/02 0701 - 05/03 0700 In: 6761 [I.V.:957.7; IV Piggyback:236.3] Out: 505 [Urine:425; Drains:80] Intake/Output this shift: Total I/O In: -  Out: 50 [Urine:50]  Vent Mode: PSV;CPAP FiO2 (%):  [40 %] 40 % Set Rate:  [16 bmp] 16 bmp Vt Set:  [430 mL] 430 mL PEEP:  [5 cmH20] 5 cmH20 Pressure Support:  [5 cmH20] 5 cmH20 Plateau Pressure:  [12 cmH20-17 cmH20] 17 cmH20  PE: Gen: on vent, opens eyes to verbal stimuli Heart: paced Lungs: CTAB Abd: soft, minimal distention, +BS, midline wound is open and clean. No surrounding erythema or drainage. JP ss   Lab Results:  Recent Labs    08/23/18 0354 08/24/18 0444  WBC 31.8* 25.4*  HGB 9.0* 8.2*  HCT 28.0* 25.1*  PLT 116* 102*   BMET Recent Labs    08/23/18 0354 08/24/18 0444  NA 148* 147*  K 4.7 4.4  CL 119* 119*  CO2 17* 16*  GLUCOSE 124* 93  BUN 57* 68*  CREATININE 2.92* 3.21*  CALCIUM 7.4* 7.6*   PT/INR No results for input(s): LABPROT, INR in the last 72 hours. CMP     Component Value Date/Time   NA 147 (H) 08/24/2018 0444   K 4.4 08/24/2018 0444   CL 119 (H) 08/24/2018 0444   CO2 16 (L) 08/24/2018 0444   GLUCOSE 93 08/24/2018 0444   BUN 68 (H) 08/24/2018 0444   CREATININE 3.21 (H) 08/24/2018 0444   CREATININE 1.07 (H) 02/13/2018 1142   CALCIUM 7.6 (L) 08/24/2018 0444   PROT 4.6 (L) 08/24/2018 0444   ALBUMIN 1.3 (L)  08/24/2018 0444   AST 55 (H) 08/24/2018 0444   ALT 10 08/24/2018 0444   ALKPHOS 201 (H) 08/24/2018 0444   BILITOT 1.8 (H) 08/24/2018 0444   GFRNONAA 13 (L) 08/24/2018 0444   GFRNONAA 49 (L) 02/13/2018 1142   GFRAA 15 (L) 08/24/2018 0444   GFRAA 57 (L) 02/13/2018 1142   Lipase     Component Value Date/Time   LIPASE 30 08/13/2018 1926       Studies/Results: Dg Chest Port 1 View  Result Date: 08/23/2018 CLINICAL DATA:  Acute respiratory failure. EXAM: PORTABLE CHEST 1 VIEW COMPARISON:  Chest x-ray from yesterday. FINDINGS: Unchanged endotracheal and enteric tubes. Unchanged right internal jugular central venous catheter. Unchanged left chest wall pacemaker. Stable cardiomegaly. Normal pulmonary vascularity. Unchanged mild bibasilar atelectasis and small bilateral pleural effusions. No consolidation or pneumothorax. No acute osseous abnormality. IMPRESSION: 1. Unchanged mild bibasilar atelectasis and small bilateral pleural effusions. Electronically Signed   By: Titus Dubin M.D.   On: 08/23/2018 07:16    Anti-infectives: Anti-infectives (From admission, onward)   Start     Dose/Rate Route Frequency Ordered Stop   08/23/18 1400  piperacillin-tazobactam (ZOSYN) IVPB 2.25 g     2.25 g 100 mL/hr over 30 Minutes Intravenous Every 8 hours 08/23/18 0928  08/21/18 1200  piperacillin-tazobactam (ZOSYN) IVPB 3.375 g  Status:  Discontinued     3.375 g 12.5 mL/hr over 240 Minutes Intravenous Every 8 hours 08/21/18 1102 08/23/18 0928   08/14/18 1000  hydroxychloroquine (PLAQUENIL) tablet 200 mg  Status:  Discontinued    Note to Pharmacy:  Take 1 tablet by mouth daily Monday through Friday only     200 mg Oral Once per day on Mon Tue Wed Thu Fri 08/14/18 0742 08/21/18 1147   08/13/18 2200  doxycycline (VIBRA-TABS) tablet 100 mg  Status:  Discontinued     100 mg Oral Every 12 hours 08/13/18 2134 08/13/18 2135   08/13/18 2200  doxycycline (VIBRAMYCIN) 100 mg in sodium chloride 0.9 % 250 mL  IVPB  Status:  Discontinued     100 mg 125 mL/hr over 120 Minutes Intravenous Every 12 hours 08/13/18 2135 08/19/18 1702   08/13/18 2145  cefTRIAXone (ROCEPHIN) 1 g in sodium chloride 0.9 % 100 mL IVPB  Status:  Discontinued     1 g 200 mL/hr over 30 Minutes Intravenous Every 24 hours 08/13/18 2134 08/14/18 0743       Assessment/Plan  POD 3, s/p ex lap with SBR for SBO and ischemic/necrotic bowel -Off pressors. Weaning from vent. Appreciate CCMs help in her care -Cont bowel rest for now. Can consider  -NS WTD dressing changes to midline wound BID -cont JP drain for now  -cont zosyn  Acute on chronic renal failure -cont foley for accurate I&Os - Cr continues to rise but uop has also increased -cont fluids, but has a history of CHF as well.  Defer fluid balance to CCM  ICD/CAD/CHF -per cardiology.  -on amio  VDRF -per CCM  FEN - PICC/TPN until reliable bowel function VTE - Lovenox/SCD ID - zosyn 4/30 >> WBC downtrending; afebrile   LOS: 11 days    [facility] 08/24/2018, 8:34 AM Pager: [phone]

## 2018-08-24 NOTE — Progress Notes (Signed)
SLP Cancellation Note  Patient Details Name: MARNIE FAZZINO MRN: [id number] DOB: June 25, 1940   Cancelled treatment:       Reason Eval/Treat Not Completed: Medical issues which prohibited therapy.  Per nursing the patient is NPO due to her small bowel obstruction.  ST will follow up next date to assess for readiness for PO intake.    Shelly Flatten, MA, [facility] SLP [phone]  Lamar Sprinkles 08/24/2018, 1:46 PM

## 2018-08-24 NOTE — Progress Notes (Signed)
Cardiology Rounding Note   Subjective:    Remains intubated. Awake on vent. CCM planning possible extubation today   Off pressors. MAPs in 14s. Creatinine up  2.9 -> 3.2  Starting to make small amounts of urine. CVP 9  Rhythm stable on IV amio  Co-ox 99% x 2 days (inaccurate vs shunt)   Objective:   Weight Range:  Vital Signs:   Temp:  [96.6 F (35.9 C)-97.6 F (36.4 C)] 97.6 F (36.4 C) (05/03 0800) Pulse Rate:  [88-107] 88 (05/03 0900) Resp:  [10-26] 19 (05/03 0900) BP: (93-144)/(43-94) 124/94 (05/03 0900) SpO2:  [99 %-100 %] 100 % (05/03 0900) Arterial Line BP: (91-134)/(33-47) 123/43 (05/03 0900) FiO2 (%):  [40 %] 40 % (05/03 0814) Weight:  [72.8 kg] 72.8 kg (05/03 0500) Last BM Date: 08/22/18  Weight change: Filed Weights   08/14/18 0500 08/22/18 0625 08/24/18 0500  Weight: 64.8 kg 70.3 kg 72.8 kg    Intake/Output:   Intake/Output Summary (Last 24 hours) at 08/24/2018 1033 Last data filed at 08/24/2018 0900 Gross per 24 hour  Intake 1214.29 ml  Output 520 ml  Net 694.29 ml     Physical Exam: General:  Elderly frail. Awake on vent  HEENT: normal + ETT Neck: supple. RIJ TLC. JVP 9. Carotids 2+ bilat; no bruits. No lymphadenopathy or thryomegaly appreciated. Cor: PMI nondisplaced. Regular rate & rhythm. No rubs, gallops or murmurs. Lungs: clear Abdomen: soft, nontender, mildly distended. Surgical dressing ok. Quiet  Extremities: no cyanosis, clubbing, rash, tr edema  RUE wrapped and oozing  Neuro: awake on vent  nonfocal    Telemetry: AV pacing 90s Personally reviewed   Labs: Basic Metabolic Panel: Recent Labs  Lab 08/18/18 0443 08/19/18 0545 08/20/18 1248  08/21/18 1202 08/21/18 2053 08/21/18 2125 08/22/18 0401 08/23/18 0354 08/24/18 0444  NA 142 142 146*   < > 147* 149* 148* 148* 148* 147*  K 3.9 4.5 4.1   < > 4.5 5.0 5.3* 5.0 4.7 4.4  CL 114* 113* 115*   < > 122* 120*  --  119* 119* 119*  CO2 18* 16* 17*   < > 16* 15*  --  16* 17* 16*   GLUCOSE 95 91 74   < > 90 90  --  111* 124* 93  BUN 63* 68* 52*   < > 42* 45*  --  44* 57* 68*  CREATININE 1.16* 1.29* 1.18*   < > 1.26* 1.89*  --  2.02* 2.92* 3.21*  CALCIUM 8.5* 8.5* 8.5*   < > 8.1* 7.5*  --  6.9* 7.4* 7.6*  MG  --   --  2.6*  --   --   --   --   --  1.9  --   PHOS 4.2 4.7*  --   --   --   --   --   --   --   --    < > = values in this interval not displayed.    Liver Function Tests: Recent Labs  Lab 08/18/18 0443 08/19/18 0545 08/21/18 1202 08/24/18 0444  AST  --   --  32 55*  ALT  --   --  16 10  ALKPHOS  --   --  88 201*  BILITOT  --   --  1.8* 1.8*  PROT  --   --  5.1* 4.6*  ALBUMIN 2.2* 2.3* 2.2* 1.3*   No results for input(s): LIPASE, AMYLASE in the last 168 hours.  No results for input(s): AMMONIA in the last 168 hours.  CBC: Recent Labs  Lab 08/18/18 0443  08/21/18 1202 08/21/18 2053 08/21/18 2125 08/22/18 0401 08/23/18 0354 08/24/18 0444  WBC 14.8*   < > 16.9* 49.3*  --  47.6* 31.8* 25.4*  NEUTROABS 10.7*  --   --   --   --   --   --   --   HGB 9.7*   < > 13.2 11.7* 11.6* 11.6* 9.0* 8.2*  HCT 32.5*   < > 41.6 36.1 34.0* 36.3 28.0* 25.1*  MCV 84.6   < > 87.0 85.5  --  85.6 85.4 82.8  PLT 246   < > 187 136*  --  148* 116* 102*   < > = values in this interval not displayed.    Cardiac Enzymes: Recent Labs  Lab 08/21/18 1202 08/21/18 1925 08/22/18 0401  TROPONINI 0.05* 0.08* 0.11*    BNP: BNP (last 3 results) Recent Labs    08/13/18 1926  BNP 404.0*    ProBNP (last 3 results) No results for input(s): PROBNP in the last 8760 hours.    Other results:  Imaging: Dg Chest Port 1 View  Result Date: 08/23/2018 CLINICAL DATA:  Acute respiratory failure. EXAM: PORTABLE CHEST 1 VIEW COMPARISON:  Chest x-ray from yesterday. FINDINGS: Unchanged endotracheal and enteric tubes. Unchanged right internal jugular central venous catheter. Unchanged left chest wall pacemaker. Stable cardiomegaly. Normal pulmonary vascularity. Unchanged mild  bibasilar atelectasis and small bilateral pleural effusions. No consolidation or pneumothorax. No acute osseous abnormality. IMPRESSION: 1. Unchanged mild bibasilar atelectasis and small bilateral pleural effusions. Electronically Signed   By: Titus Dubin M.D.   On: 08/23/2018 07:16     Medications:     Scheduled Medications: . chlorhexidine gluconate (MEDLINE KIT)  15 mL Mouth Rinse BID  . heparin injection (subcutaneous)  5,000 Units Subcutaneous Q8H  . mouth rinse  15 mL Mouth Rinse 10 times per day  . mupirocin ointment   Nasal BID  . pantoprazole (PROTONIX) IV  40 mg Intravenous QHS    Infusions: . amiodarone 30 mg/hr (08/24/18 0600)  . fentaNYL infusion INTRAVENOUS Stopped (08/23/18 1635)  . lactated ringers 10 mL/hr at 08/24/18 0600  . piperacillin-tazobactam (ZOSYN)  IV Stopped (08/24/18 0530)    PRN Medications: acetaminophen **OR** acetaminophen, ondansetron **OR** ondansetron (ZOFRAN) IV   Assessment:   Savannah Cerino [name] a 78 y.o.femalewith a history of chronic combined systolic and diastolic CHF due to iCM with EF of 30% on Echo this admit s/p ICD, CAD with remote MI, hypertension, hyperlipidemia, CKD stage III, chronic anemia, and GERDwho underwent ex-lap for SBO on 4/30. Transferred to ICU post-op for severe hypotension and shock  Plan/Discussion:    1. Shock - with normal MV sat and low CVP suspect major issue is sepsis and hypovolemia and not cardiogenic - Now off presors. Co-ox remains artificially high. Will repeat  - continue broad spectrum abx - d/w CCM at bedside   2. Acute hypoxic respiratory failure - due to #1 - CCM managing vent. Possible extubation today  3. SBO s/p ex lap with lysis of adhesions on 4/30 - GSU managing  4. AKI on CKD stage 4  - Creatinine peaked at 3.21 n 08/13/2018. Creatinine 1.26 pre-op. Now going back up with ATN. Hopefully will plateau soon. Starting to make urine. No indication for HD currently  5. Chronic  systolic HF due to iCM - EF 30% s/p BIV-ICD (MDT) - co-ox high. CVP 9  -  off HF meds due to AKI and shock   6. CAD - stable no s/s ischemia currently  7. Paroxysmal atrial tach - s/p DC-CV on 4/29. Maintaining NSR  - keep atrial pacing at 90. Can reprogram ICD as she improves - continue IV amio (gut not working yet)  8. RUE IV infiltrate with pressor agents - treated with regitine  - continue wound care  CRITICAL CARE Performed by: Glori Bickers  Total critical care time: 35 minutes  Critical care time was exclusive of separately billable procedures and treating other patients.  Critical care was necessary to treat or prevent imminent or life-threatening deterioration.  Critical care was time spent personally by me (independent of midlevel providers or residents) on the following activities: development of treatment plan with patient and/or surrogate as well as nursing, discussions with consultants, evaluation of patient's response to treatment, examination of patient, obtaining history from patient or surrogate, ordering and performing treatments and interventions, ordering and review of laboratory studies, ordering and review of radiographic studies, pulse oximetry and re-evaluation of patient's condition.      Length of Stay: 11   Glori Bickers  MD 08/24/2018, 10:33 AM  Advanced Heart Failure Team Pager [phone] (M-F; [place])  Please contact [facility] for night-coverage after hours (4p -7a ) and weekends on [email or web address]

## 2018-08-24 NOTE — Progress Notes (Addendum)
NAME:  [name], MRN:  [id number], DOB:  04-10-1941, LOS: 83 ADMISSION DATE:  08/13/2018, CONSULTATION DATE:  4/20 REFERRING MD:  Renne Crigler , CHIEF COMPLAINT:  Vent management    Brief History   78 year old female with significant cardiac history that is post exploratory laparotomy for small bowel obstruction with associated ischemic bowel on 4/30, return to the intensive care on mechanical ventilator, critical care asked to assist with care  Past Medical History  Congestive heart failure with EF 30%, nonsustained VT, pericardial effusion CAD w/ prior AICD, diastolic heart failure, CKD stage III, hypertension, rheumatoid arthritis, anemia of chronic disease.  Significant Hospital Events   4/22 Admitted to [facility] with small bowel obstruction.  Also started on antibiotics for possible PNA 4/28 ongoing high-grade bowel obstruction 4/29 arrived at [facility] 4/30 Ex-lap with lysis of adhesion, bowel resection for ischemic bowel return to ICU on ventilator 4/30 Shocked for VT 5/01 Remains on 40 mcg levophed, CVP 4, AKI, less JP drainage.   5/02 On/off vasopressin, AKI/oliguria  Consults:  Radiology 4/29  Procedures:  R Fem ALine 4/30 >>   Significant Diagnostic Tests:  CT imaging 4/29 >> Persistent small bowel obstruction  Micro Data:  BCx2 4/22 >> neg UC 4/22 >> neg  Antimicrobials:  Doxy 4/22 >> 4/28 Zosyn 4/30 >>  Interim history/subjective:  Afebrile.  I/O-464ml UOP in last 24 hours / +690 for 24h.    Objective   Blood pressure (!) 107/53, pulse 89, temperature (!) 97.5 F (36.4 C), temperature source Axillary, resp. rate 12, height 5\' 4"  (1.626 m), weight 72.8 kg, SpO2 100 %. CVP:  [2 mmHg-9 mmHg] 6 mmHg  Vent Mode: PRVC FiO2 (%):  [40 %] 40 % Set Rate:  [16 bmp] 16 bmp Vt Set:  [430 mL] 430 mL PEEP:  [5 cmH20] 5 cmH20 Pressure Support:  [5 cmH20] 5 cmH20 Plateau Pressure:  [12 cmH20-17 cmH20] 17 cmH20   Intake/Output Summary (Last 24 hours) at 08/24/2018 0749 Last  data filed at 08/24/2018 0600 Gross per 24 hour  Intake 1193.98 ml  Output 505 ml  Net 688.98 ml   Filed Weights   08/14/18 0500 08/22/18 0625 08/24/18 0500  Weight: 64.8 kg 70.3 kg 72.8 kg    Examination: General: elderly female lying in bed on vent, NAD  HEENT: MM pink/moist, ETT, NGT Neuro: awake, alert, nods, HOH, MAE but generalized weakness CV: s1s2 rrr, no m/r/g PULM: even/non-labored, lungs bilaterally clear GI: midline abd dressing c/d/i, JP drain with minimal drainage Extremities: warm/dry, UE 1-2+ edema  Skin: no rashes or lesions  Resolved Hospital Problem list   RLL PNA - treated with 7 days doxycycline   Assessment & Plan:    Small Bowel Obstruction s/p Exploratory Laparotomy -initial admit 4/22 at [facility], failed conservative therapy  P: Post-op care, feeding per CCS Zosyn, D4/x  Consider TNA if not going to use gut soon > defer to CCS  Acute Respiratory Insufficiency  -post op exploratory laparotomy  Small R Effusion / Atelectasis P: PRVC 8cc/kg as rest mode Wean PEEP / FiO2 for sats > 90% Daily PSV, goal extubation 5/3 if meets criteria  Follow intermittent CXR   Shock  -?ABL, +/- sepsis with ischemic bowel, volume depletion -immune suppressed on Plaquenil for RA  P: Follow CVP  Off vasopressin 5/3 am  Chronic Systolic / Diastolic CHF  -LVEF 44-03%, diffuse hypokinesis 08/19/18, AICD in place AF/Flutter, VT -s/p cardioversion for wide complex tachycardia 4/30 pm P: CHF service following, appreciate  assistance  Continue amiodarone Consider restart of low dose lopressor   AKI, Oliguria  CKD III  P: Trend BMP / urinary output Replace electrolytes as indicated Avoid nephrotoxic agents, ensure adequate renal perfusion  At Risk Malnutrition  P: Defer feeding to [facility]    RA on Plaquenil P: Hold home plaquenil   Anemia -ABL + ACD Thrombocytopenia  P: Follow CBC  Transfuse per ICU guidelines  Change lovenox to SQ Heparin with AKI   Best  practice:  Diet: NPO  Pain/Anxiety/Delirium protocol (if indicated): PRN fentanyl   VAP protocol (if indicated): in place  DVT prophylaxis: SCD's, heparin GI prophylaxis: PPI Glucose control: n/a Mobility: bedrest  Code Status: Full Code  Family Communication: Husband updated 5/3 on plan of care.  Disposition: ICU  Labs   CBC: Recent Labs  Lab 08/17/18 0805 08/18/18 0443  08/21/18 1202 08/21/18 2053 08/21/18 2125 08/22/18 0401 08/23/18 0354 08/24/18 0444  WBC 13.5* 14.8*   < > 16.9* 49.3*  --  47.6* 31.8* 25.4*  NEUTROABS 9.8* 10.7*  --   --   --   --   --   --   --   HGB 10.1* 9.7*   < > 13.2 11.7* 11.6* 11.6* 9.0* 8.2*  HCT 32.7* 32.5*   < > 41.6 36.1 34.0* 36.3 28.0* 25.1*  MCV 84.9 84.6   < > 87.0 85.5  --  85.6 85.4 82.8  PLT 241 246   < > 187 136*  --  148* 116* 102*   < > = values in this interval not displayed.    Basic Metabolic Panel: Recent Labs  Lab 08/17/18 0805 08/18/18 0443 08/19/18 0545 08/20/18 1248  08/21/18 1202 08/21/18 2053 08/21/18 2125 08/22/18 0401 08/23/18 0354 08/24/18 0444  NA 144 142 142 146*   < > 147* 149* 148* 148* 148* 147*  K 3.6 3.9 4.5 4.1   < > 4.5 5.0 5.3* 5.0 4.7 4.4  CL 113* 114* 113* 115*   < > 122* 120*  --  119* 119* 119*  CO2 20* 18* 16* 17*   < > 16* 15*  --  16* 17* 16*  GLUCOSE 89 95 91 74   < > 90 90  --  111* 124* 93  BUN 57* 63* 68* 52*   < > 42* 45*  --  44* 57* 68*  CREATININE 1.15* 1.16* 1.29* 1.18*   < > 1.26* 1.89*  --  2.02* 2.92* 3.21*  CALCIUM 8.4* 8.5* 8.5* 8.5*   < > 8.1* 7.5*  --  6.9* 7.4* 7.6*  MG 3.0*  --   --  2.6*  --   --   --   --   --  1.9  --   PHOS 3.6 4.2 4.7*  --   --   --   --   --   --   --   --    < > = values in this interval not displayed.   GFR: Estimated Creatinine Clearance: 13.9 mL/min (A) (by C-G formula based on SCr of 3.21 mg/dL (H)). Recent Labs  Lab 08/21/18 1925 08/21/18 2053 08/21/18 2054 08/22/18 0401 08/23/18 0354 08/24/18 0444  WBC  --  49.3*  --  47.6* 31.8*  25.4*  LATICACIDVEN 3.8*  --  4.8*  --   --   --     Liver Function Tests: Recent Labs  Lab 08/17/18 0805 08/18/18 0443 08/19/18 0545 08/21/18 1202 08/24/18 0444  AST  --   --   --  32 55*  ALT  --   --   --  16 10  ALKPHOS  --   --   --  88 201*  BILITOT  --   --   --  1.8* 1.8*  PROT  --   --   --  5.1* 4.6*  ALBUMIN 2.2* 2.2* 2.3* 2.2* 1.3*   No results for input(s): LIPASE, AMYLASE in the last 168 hours. No results for input(s): AMMONIA in the last 168 hours.  ABG    Component Value Date/Time   PHART 7.396 08/21/2018 2125   PCO2ART 22.7 (L) 08/21/2018 2125   PO2ART 133.0 (H) 08/21/2018 2125   HCO3 14.0 (L) 08/21/2018 2125   TCO2 15 (L) 08/21/2018 2125   ACIDBASEDEF 9.0 (H) 08/21/2018 2125   O2SAT 99.4 08/24/2018 0450     Coagulation Profile: No results for input(s): INR, PROTIME in the last 168 hours.  Cardiac Enzymes: Recent Labs  Lab 08/21/18 1202 08/21/18 1925 08/22/18 0401  TROPONINI 0.05* 0.08* 0.11*    HbA1C: No results found for: HGBA1C  CBG: Recent Labs  Lab 08/21/18 1200  GLUCAP 82      Critical care time: 35 minutes    Noe Gens, NP-C [facility] Pgr: [phone] or if no answer [phone] 08/24/2018, 7:49 AM

## 2018-08-25 ENCOUNTER — Encounter (HOSPITAL_COMMUNITY): Payer: Self-pay | Admitting: General Surgery

## 2018-08-25 ENCOUNTER — Inpatient Hospital Stay (HOSPITAL_COMMUNITY): Payer: Medicare Other

## 2018-08-25 DIAGNOSIS — I5023 Acute on chronic systolic (congestive) heart failure: Secondary | ICD-10-CM

## 2018-08-25 DIAGNOSIS — E43 Unspecified severe protein-calorie malnutrition: Secondary | ICD-10-CM

## 2018-08-25 LAB — BASIC METABOLIC PANEL
Anion gap: 11 (ref 5–15)
BUN: 65 mg/dL — ABNORMAL HIGH (ref 8–23)
CO2: 17 mmol/L — ABNORMAL LOW (ref 22–32)
Calcium: 7.4 mg/dL — ABNORMAL LOW (ref 8.9–10.3)
Chloride: 120 mmol/L — ABNORMAL HIGH (ref 98–111)
Creatinine, Ser: 3.19 mg/dL — ABNORMAL HIGH (ref 0.44–1.00)
GFR calc Af Amer: 15 mL/min — ABNORMAL LOW (ref >60)
GFR calc non Af Amer: 13 mL/min — ABNORMAL LOW (ref >60)
Glucose, Bld: 65 mg/dL — ABNORMAL LOW (ref 70–99)
Potassium: 3.7 mmol/L (ref 3.5–5.1)
Sodium: 148 mmol/L — ABNORMAL HIGH (ref 135–145)

## 2018-08-25 LAB — COOXEMETRY PANEL
Carboxyhemoglobin: 1.5 % (ref 0.5–1.5)
Carboxyhemoglobin: 1.7 % — ABNORMAL HIGH (ref 0.5–1.5)
Methemoglobin: 1.3 % (ref 0.0–1.5)
Methemoglobin: 1.7 % — ABNORMAL HIGH (ref 0.0–1.5)
O2 Saturation: 98.5 %
O2 Saturation: 68.4 %
Total hemoglobin: 8.9 g/dL — ABNORMAL LOW (ref 12.0–16.0)
Total hemoglobin: 6.9 g/dL — CL (ref 12.0–16.0)

## 2018-08-25 LAB — CBC
HCT: 23.5 % — ABNORMAL LOW (ref 36.0–46.0)
Hemoglobin: 7.8 g/dL — ABNORMAL LOW (ref 12.0–15.0)
MCH: 26.7 pg (ref 26.0–34.0)
MCHC: 33.2 g/dL (ref 30.0–36.0)
MCV: 80.5 fL (ref 80.0–100.0)
Platelets: 100 10*3/uL — ABNORMAL LOW (ref 150–400)
RBC: 2.92 MIL/uL — ABNORMAL LOW (ref 3.87–5.11)
RDW: 19.9 % — ABNORMAL HIGH (ref 11.5–15.5)
WBC: 13.8 10*3/uL — ABNORMAL HIGH (ref 4.0–10.5)
nRBC: 0 % (ref 0.0–0.2)

## 2018-08-25 LAB — GLUCOSE, CAPILLARY
Glucose-Capillary: 116 mg/dL — ABNORMAL HIGH (ref 70–99)
Glucose-Capillary: 125 mg/dL — ABNORMAL HIGH (ref 70–99)
Glucose-Capillary: 168 mg/dL — ABNORMAL HIGH (ref 70–99)
Glucose-Capillary: 62 mg/dL — ABNORMAL LOW (ref 70–99)

## 2018-08-25 MED ORDER — TRAVASOL 10 % IV SOLN
INTRAVENOUS | Status: AC
  Administered 2018-08-25: 18:00:00 via INTRAVENOUS
  Filled 2018-08-25: qty 576

## 2018-08-25 MED ORDER — DEXTROSE 5 % IV SOLN
INTRAVENOUS | Status: DC
  Administered 2018-08-25 – 2018-08-26 (×2): via INTRAVENOUS

## 2018-08-25 MED ORDER — DEXTROSE 50 % IV SOLN
INTRAVENOUS | Status: AC
  Filled 2018-08-25: qty 50

## 2018-08-25 MED ORDER — FUROSEMIDE 10 MG/ML IJ SOLN
20.0000 mg | Freq: Once | INTRAMUSCULAR | Status: AC
  Administered 2018-08-25: 20 mg via INTRAVENOUS
  Filled 2018-08-25: qty 2

## 2018-08-25 MED ORDER — INSULIN ASPART 100 UNIT/ML ~~LOC~~ SOLN
0.0000 [IU] | SUBCUTANEOUS | Status: DC
  Administered 2018-08-26 – 2018-08-27 (×3): 1 [IU] via SUBCUTANEOUS
  Administered 2018-08-27 (×2): 2 [IU] via SUBCUTANEOUS
  Administered 2018-08-27 – 2018-08-28 (×5): 1 [IU] via SUBCUTANEOUS
  Administered 2018-08-28 – 2018-08-29 (×2): 2 [IU] via SUBCUTANEOUS
  Administered 2018-08-29 (×3): 1 [IU] via SUBCUTANEOUS
  Administered 2018-08-30: 3 [IU] via SUBCUTANEOUS
  Administered 2018-08-30: 2 [IU] via SUBCUTANEOUS
  Administered 2018-08-30 – 2018-09-01 (×7): 1 [IU] via SUBCUTANEOUS
  Administered 2018-09-01 (×2): 2 [IU] via SUBCUTANEOUS
  Administered 2018-09-02: 04:00:00 3 [IU] via SUBCUTANEOUS
  Administered 2018-09-02: 2 [IU] via SUBCUTANEOUS
  Administered 2018-09-02: 3 [IU] via SUBCUTANEOUS

## 2018-08-25 MED ORDER — DEXTROSE 50 % IV SOLN
1.0000 | Freq: Once | INTRAVENOUS | Status: AC
  Administered 2018-08-25: 50 mL via INTRAVENOUS

## 2018-08-25 NOTE — Progress Notes (Signed)
SLP Cancellation Note  Patient Details Name: [name] MRN: [id number] DOB: 1941/02/04   Cancelled treatment:       Reason Eval/Treat Not Completed: Medical issues which prohibited therapy. Per surgery note today, pt to continue bowel rest with NGT for now, consult placed for TPN. Will f/u for swallow eval when given medical clearance to do so.   Venita Sheffield Aquil Duhe 08/25/2018, 8:35 AM  Pollyann Glen, M.A. [place] Acute Environmental education officer [phone] Office [phone]

## 2018-08-25 NOTE — Consult Note (Signed)
[facility] Nurse wound consult note Reason for Consult:Application of NPWT (VAC) therapy to midline abdominal surgical wound.   Wound type:surgical wound- exploratory laparotomy on 4/30 Pressure Injury POA: NA Measurement: 14 cm x 4 cm x 4 cm  Wound bed: blue sutures in wound bed.  Adipose tissue present.  Pale pink nongranulating Drainage (amount, consistency, odor) moderate serosanguinous  No odor Periwound:intact Dressing procedure/placement/frequency: Cleanse wound with NS and pat dry.  Fill wound bed with 1 piece black foam.  Drape to cover and seal immediately achieved at 125.  Change M/W/F.  WOC team will follow.   Domenic Moras MSN, RN, FNP-BC CWON Wound, Ostomy, Continence Nurse Pager [phone]

## 2018-08-25 NOTE — Progress Notes (Signed)
Nutrition Follow-up  DOCUMENTATION CODES:   Severe malnutrition in context of acute illness/injury  INTERVENTION:    TPN per pharmacy  Monitor for diet advancement/need for enteral nutrition  NUTRITION DIAGNOSIS:   Severe Malnutrition related to acute illness(SBO) as evidenced by energy intake < or equal to 50% for > or equal to 5 days, mild fat depletion, moderate muscle depletion.  Ongoing  GOAL:   Patient will meet greater than or equal to 90% of their needs  Addressed via TPN  MONITOR:   Diet advancement, Weight trends, Labs, Vent status, TF tolerance, Skin, I & O's  REASON FOR ASSESSMENT:   Ventilator   ASSESSMENT:   Patient with PMH significant for anemia, CAD s/p AICD placement, breast cancer, CKD III, CHF, HLD, impaired hearing, and RA. [facility] on 4/[facility] with CAP and SBO.   4/28- CT scan showed persistent SBO, tx to [facility] for surgery 4/30- ex lap, lysis of adhesions, small bowel resection due to ischemic changes  5/3- extubated   RD working remotely.   Pt currently off pressors. UOP increasing. Pt had initial post-op BM three days ago, none since. Will monitor for results. Per surgery plan for continued bowel rest today. Pt with NGT to suction. Pt to start TPN. See recommendation below.   Weight noted to increase from 70.3 kg on 5/1 to 72.8 kg today. Will utilize EDW of 64.8 to re-estimate needs now that pt is extubated.   I/O: +5,000 ml since admit UOP: 915 ml x 24 hrs  NGT: 200 ml x 24 hrs  JP drain: 45 ml x 24 hrs   Drips: D5 @ 30 ml/hr  Medications: SS novolog Labs: Na 148 (H) CBG 65-124  Diet Order:   Diet Order            Diet NPO time specified  Diet effective now              EDUCATION NEEDS:   Not appropriate for education at this time  Skin:  Skin Assessment: Skin Integrity Issues: Skin Integrity Issues:: Stage II, Incisions Stage II: buttocks Incisions: abdomen  Last BM:  5/1- liquid  Height:   Ht Readings from  Last 1 Encounters:  08/21/18 5\' 4"  (1.626 m)    Weight:   Wt Readings from Last 1 Encounters:  08/24/18 72.8 kg    Ideal Body Weight:  54.5 kg  BMI:  Body mass index is 27.55 kg/m.  Estimated Nutritional Needs:   Kcal:  2000-2200 kcal  Protein:  100-120 grams  Fluid:  >/= 2 L/day   Mariana Single RD, LDN Clinical Nutrition Pager # - [phone]

## 2018-08-25 NOTE — Progress Notes (Signed)
Progress Note  Patient Name: [name] Date of Encounter: 08/25/2018  Primary Cardiologist: Kate Sable, MD   Subjective   No Chest pain or shortness of breath.  Inpatient Medications    Scheduled Meds: . chlorhexidine  15 mL Mouth Rinse BID  . heparin injection (subcutaneous)  5,000 Units Subcutaneous Q8H  . insulin aspart  0-9 Units Subcutaneous Q4H  . mouth rinse  15 mL Mouth Rinse q12n4p  . mupirocin ointment   Nasal BID  . pantoprazole (PROTONIX) IV  40 mg Intravenous QHS   Continuous Infusions: . amiodarone 30 mg/hr (08/25/18 0700)  . fentaNYL infusion INTRAVENOUS Stopped (08/24/18 1510)  . lactated ringers 10 mL/hr at 08/25/18 0700  . piperacillin-tazobactam (ZOSYN)  IV Stopped (08/25/18 0535)  . TPN ADULT (ION)     PRN Meds: acetaminophen **OR** acetaminophen, ondansetron **OR** ondansetron (ZOFRAN) IV   Vital Signs    Vitals:   08/25/18 0500 08/25/18 0600 08/25/18 0700 08/25/18 0826  BP: (!) 117/56 (!) 115/55 (!) 115/55   Pulse: 89 89 89   Resp: 20 19 16    Temp:    97.6 F (36.4 C)  TempSrc:    Oral  SpO2: 97% 97% 98%   Weight:      Height:        Intake/Output Summary (Last 24 hours) at 08/25/2018 1032 Last data filed at 08/25/2018 0700 Gross per 24 hour  Intake 723.04 ml  Output 1075 ml  Net -351.96 ml   Last 3 Weights 08/24/2018 08/22/2018 08/14/2018  Weight (lbs) 160 lb 7.9 oz 154 lb 15.7 oz 142 lb 13.7 oz  Weight (kg) 72.8 kg 70.3 kg 64.8 kg      Telemetry    AV sequential pacing 90 bpm, few short runs of NSVT - Personally Reviewed   Physical Exam  Elderly woman, sitting in chair at bedside, NAD. Hard of hearing. GEN: No acute distress.   Neck: No JVD Cardiac: RRR, no murmurs, rubs, or gallops.  Respiratory: Clear to auscultation bilaterally. GI: Soft MS: No edema; No deformity. Neuro:  Nonfocal  Psych: Normal affect   Labs    Chemistry Recent Labs  Lab 08/19/18 0545  08/21/18 1202  08/23/18 0354 08/24/18 0444 08/25/18  0415  NA 142   < > 147*   < > 148* 147* 148*  K 4.5   < > 4.5   < > 4.7 4.4 3.7  CL 113*   < > 122*   < > 119* 119* 120*  CO2 16*   < > 16*   < > 17* 16* 17*  GLUCOSE 91   < > 90   < > 124* 93 65*  BUN 68*   < > 42*   < > 57* 68* 65*  CREATININE 1.29*   < > 1.26*   < > 2.92* 3.21* 3.19*  CALCIUM 8.5*   < > 8.1*   < > 7.4* 7.6* 7.4*  PROT  --   --  5.1*  --   --  4.6*  --   ALBUMIN 2.3*  --  2.2*  --   --  1.3*  --   AST  --   --  32  --   --  55*  --   ALT  --   --  16  --   --  10  --   ALKPHOS  --   --  88  --   --  201*  --   BILITOT  --   --  1.8*  --   --  1.8*  --   GFRNONAA 39*   < > 40*   < > 15* 13* 13*  GFRAA 46*   < > 47*   < > 17* 15* 15*  ANIONGAP 13   < > 9   < > 12 12 11    < > = values in this interval not displayed.     Hematology Recent Labs  Lab 08/23/18 0354 08/24/18 0444 08/25/18 0415  WBC 31.8* 25.4* 13.8*  RBC 3.28* 3.03* 2.92*  HGB 9.0* 8.2* 7.8*  HCT 28.0* 25.1* 23.5*  MCV 85.4 82.8 80.5  MCH 27.4 27.1 26.7  MCHC 32.1 32.7 33.2  RDW 19.4* 20.0* 19.9*  PLT 116* 102* 100*    Cardiac Enzymes Recent Labs  Lab 08/21/18 1202 08/21/18 1925 08/22/18 0401  TROPONINI 0.05* 0.08* 0.11*   No results for input(s): TROPIPOC in the last 168 hours.   BNPNo results for input(s): BNP, PROBNP in the last 168 hours.   DDimer No results for input(s): DDIMER in the last 168 hours.   Radiology    Dg Chest Port 1 View  Result Date: 08/25/2018 CLINICAL DATA:  Acute respiratory failure with hypoxia. EXAM: PORTABLE CHEST 1 VIEW COMPARISON:  Chest x-ray from Aug 23, 2018. FINDINGS: Interval removal of the endotracheal tube. Unchanged enteric tube and right internal jugular central venous catheter. Unchanged left chest wall pacemaker. Stable cardiomegaly. New mild pulmonary vascular congestion. Unchanged bibasilar atelectasis and small bilateral pleural effusions. No pneumothorax. No acute osseous abnormality. IMPRESSION: 1. New mild pulmonary vascular congestion. 2.  Unchanged bibasilar atelectasis small bilateral pleural effusions. Electronically Signed   By: Titus Dubin M.D.   On: 08/25/2018 07:11    Cardiac Studies   Echo: IMPRESSIONS    1. The left ventricle has a visually estimated ejection fraction of approximately 30%. There is inferior basal and inferoseptal akinesis. The cavity size and wall thickness are normal. Diastolic function is indeterminate.  2. The right ventricle has normal systolic function. The cavity was normal. There is no increase in right ventricular wall thickness. Device wire noted in right ventricle. RV-RA gradient 25.9 mmHg, unable to estimate RVSP.  3. Left atrial size was moderately dilated.  4. The aortic valve is tricuspid. Aortic valve regurgitation is trivial by color flow Doppler. Mild aortic annular calcification noted.  5. The mitral valve is grossly normal. There is mild mitral annular calcification present.  6. The aortic root is normal in size and structure.  7. The tricuspid valve is grossly normal.  8. The pericardial effusion is circumferential.  9. Small to moderate pericardial effusion with associated adipose tissue/organization.  Patient Profile     78 y.o. female with a history of chronic combined systolic and diastolic CHFdue to iCMwith EF of 30%on Echo this admits/p ICD, CAD with remote MI, hypertension, hyperlipidemia, CKD stage III, chronic anemia, and GERDwhounderwent ex-lap for SBO on 4/30. Transferred to ICU post-op for severe hypotension and shock  Assessment & Plan    1) Shock - multifactorial, hemodynamics more c/w distributive shock, now improved. Co-ox drawn from A-line. Will re-draw this am. Off all pressors, improving.  2) Acute systolic heart failure: post-op, suspect volume shifts. Minimize meds with acute on chronic kidney injury, CKD 4. No ACE/ARB/Aldactone. Hemodynamics stable, oxygenating well at present. Creatinine now at plateau phase.   3) Acute blood loss anemia - per  primary, HgB 7.8 (down from 8.2 yesterday).  4) PAT: on IV amiodarone. Convert to oral  amiodarone when taking PO      For questions or updates, please contact [place] Please consult [email or web address] for contact info under        Signed, Sherren Mocha, MD  08/25/2018, 10:32 AM

## 2018-08-25 NOTE — Evaluation (Signed)
Occupational Therapy Evaluation Patient Details Name: [name] MRN: [id number] DOB: 08-May-1940 Today's Date: 08/25/2018    History of Present Illness 78 year old female with significant cardiac history that is post exploratory laparotomy for small bowel obstruction with associated ischemic bowel on 4/30, return to the intensive care on mechanical ventilator, critical care asked to assist with care.  VDRF 4/30-5/3.  Also PNA.  JQZ:[id number] heart failure with EF 30%, nonsustained VT, pericardial effusion CAD w/ prior AICD, diastolic heart failure, CKD stage III, hypertension, rheumatoid arthritis, anemia of chronic disease.   Clinical Impression   PTA Pt was mod I - used DME for ambulation and bathing but still cooking and driving. Pt today with decreased cognition, balance, strength. Required mod to total A for ADL. Pt will benefit from skilled OT in the acute setting as well as afterwards at the SNF level to maximize safety and independence in ADL and functional transfers to return to PLOF.    Follow Up Recommendations  SNF;Supervision/Assistance - 24 hour    Equipment Recommendations  Other (comment);3 in 1 bedside commode(defer to next venue)    Recommendations for Other Services       Precautions / Restrictions Precautions Precautions: Fall Precaution Comments: JP drain Restrictions Weight Bearing Restrictions: No      Mobility Bed Mobility Overal bed mobility: Needs Assistance Bed Mobility: Supine to Sit     Supine to sit: Max assist;+2 for physical assistance;HOB elevated     General bed mobility comments: Needed assist for LEs and elevation of trunk.   Transfers Overall transfer level: Needs assistance Equipment used: 2 person hand held assist Transfers: Sit to/from [facility] Sit to Stand: Mod assist;+2 physical assistance;From elevated surface Stand pivot transfers: Mod assist;+2 physical assistance;From elevated surface       General  transfer comment: Used pad to assist with power up.  Pt able to take pivotal steps around with cues and used pad and support to guide pt around to chair.      Balance Overall balance assessment: Needs assistance Sitting-balance support: Feet supported;Bilateral upper extremity supported Sitting balance-Leahy Scale: Poor Sitting balance - Comments: Initially needed mod assist to sit EOB but did progress to min guard assist sitting a total of 8 min. Posterior lean at times.   Postural control: Posterior lean Standing balance support: Bilateral upper extremity supported;During functional activity Standing balance-Leahy Scale: Poor Standing balance comment: Mod assist of 2 persons with use of pad to stand up and not standing fully upright.                           ADL either performed or assessed with clinical judgement   ADL Overall ADL's : Needs assistance/impaired Eating/Feeding: NPO   Grooming: Maximal assistance;Sitting   Upper Body Bathing: Maximal assistance   Lower Body Bathing: Total assistance   Upper Body Dressing : Maximal assistance   Lower Body Dressing: Total assistance   Toilet Transfer: Moderate assistance;+2 for physical assistance;+2 for safety/equipment;Stand-pivot Toilet Transfer Details (indicate cue type and reason): face to face transfer simulated through recliner transfer Bunn and Hygiene: Total assistance       Functional mobility during ADLs: Moderate assistance;+2 for safety/equipment;+2 for physical assistance General ADL Comments: confused, generalized weakness, BUE edema, delayed processing     Vision         Perception     Praxis      Pertinent Vitals/Pain Pain Assessment: Faces Faces Pain Scale:  Hurts even more Pain Location: stomach Pain Descriptors / Indicators: Aching;Grimacing;Guarding Pain Intervention(s): Limited activity within patient's tolerance;Monitored during session;Repositioned      Hand Dominance Right   Extremity/Trunk Assessment Upper Extremity Assessment Upper Extremity Assessment: Generalized weakness;RUE deficits/detail;LUE deficits/detail(BUE edema noted) RUE Deficits / Details: edema noted, forearm wrapped, unable to make full fist at this time strength grossly 3/5 RUE Coordination: decreased gross motor;decreased fine motor LUE Deficits / Details: edema noted, unable to make full fist at this time strength grossly 3/5 LUE Coordination: decreased fine motor;decreased gross motor   Lower Extremity Assessment Lower Extremity Assessment: Defer to PT evaluation   Cervical / Trunk Assessment Cervical / Trunk Assessment: Kyphotic   Communication Communication Communication: HOH(very)   Cognition Arousal/Alertness: Awake/alert Behavior During Therapy: Flat affect Overall Cognitive Status: Impaired/Different from baseline Area of Impairment: Orientation;Memory;Following commands;Safety/judgement;Awareness;Problem solving                 Orientation Level: Disoriented to;Place;Time;Situation   Memory: Decreased short-term memory;Decreased recall of precautions Following Commands: Follows one step commands with increased time Safety/Judgement: Decreased awareness of safety;Decreased awareness of deficits   Problem Solving: Slow processing;Decreased initiation;Difficulty sequencing;Requires verbal cues;Requires tactile cues General Comments: will reply "yes" to everything and often repeat what you say. Very very sweet nature   General Comments  HR 90 bpm, O2 on .5L 97% on arrival. Did desat to 88% on RA with activity therefore placed 2LO2 and sats 97%.  LEft on .5L with sats 96%.  BP at end of treatment 142/76.      Exercises Exercises: Other exercises Other Exercises Other Exercises: open and close digits for BUE edema   Shoulder Instructions      Home Living Family/patient expects to be discharged to:: Private residence Living  Arrangements: Spouse/significant other Available Help at Discharge: Family;Available PRN/intermittently(husband works fulltime) Type of Home: Mobile home Home Access: Stairs to enter Technical brewer of Steps: 4 Entrance Stairs-Rails: Right;Left;Can reach both [place]: One level     Bathroom Shower/Tub: Corporate investment banker: [place] Accessibility: Yes   Home Equipment: Environmental consultant - 2 wheels;Shower seat;Hand held shower head;Toilet riser   Additional Comments: PT spoke with Pt's husband      Prior Functioning/Environment Level of Independence: Independent with assistive device(s);Needs assistance  Gait / Transfers Assistance Needed: Used RW Modif I ADL's / Homemaking Assistance Needed: Bathe and dress self, groom self, did cooking   Comments: Still driving        OT Problem List: Decreased activity tolerance;Decreased range of motion;Decreased strength;Impaired balance (sitting and/or standing);Decreased cognition;Decreased safety awareness;Decreased knowledge of use of DME or AE;Increased edema      OT Treatment/Interventions: Self-care/ADL training;DME and/or AE instruction;Therapeutic activities;Patient/family education;Balance training    OT Goals(Current goals can be found in the care plan section) Acute Rehab OT Goals Patient Stated Goal: get stronger and safe to go home OT Goal Formulation: With family Time For Goal Achievement: 09/08/18 Potential to Achieve Goals: Good ADL Goals Pt Will Perform Grooming: with min guard assist;standing Pt Will Perform Upper Body Dressing: with supervision;sitting Pt Will Perform Lower Body Dressing: with min guard assist;sit to/from stand Pt Will Transfer to Toilet: with min assist;ambulating Pt Will Perform Toileting - Clothing Manipulation and hygiene: with min guard assist;sit to/from stand Additional ADL Goal #1: Pt will perform bed mobility at min guard level prior to engaging in ADL  OT  Frequency: Min 2X/week   Barriers to D/C:  Co-evaluation PT/OT/SLP Co-Evaluation/Treatment: Yes Reason for Co-Treatment: Complexity of the patient's impairments (multi-system involvement);Necessary to address cognition/behavior during functional activity;For patient/therapist safety;To address functional/ADL transfers PT goals addressed during session: Mobility/safety with mobility;Balance OT goals addressed during session: ADL's and self-care      AM-PAC OT "6 Clicks" Daily Activity     Outcome Measure Help from another person eating meals?: Total Help from another person taking care of personal grooming?: A Lot Help from another person toileting, which includes using toliet, bedpan, or urinal?: A Lot Help from another person bathing (including washing, rinsing, drying)?: A Lot Help from another person to put on and taking off regular upper body clothing?: A Lot Help from another person to put on and taking off regular lower body clothing?: Total 6 Click Score: 10   End of Session Equipment Utilized During Treatment: Gait belt;Oxygen Nurse Communication: Mobility status  Activity Tolerance: Patient tolerated treatment well Patient left: in chair;with call bell/phone within reach;with chair alarm set(legs up in recliner)  OT Visit Diagnosis: Unsteadiness on feet (R26.81);Other abnormalities of gait and mobility (R26.89);Muscle weakness (generalized) (M62.81);Other symptoms and signs involving cognitive function                Time: 1594-7076 OT Time Calculation (min): 24 min Charges:  OT General Charges $OT Visit: 1 Visit OT Evaluation $OT Eval Moderate Complexity: [place] OTR/L Acute Rehabilitation Services Pager: [phone] Office: [place] 08/25/2018, 12:11 PM

## 2018-08-25 NOTE — Progress Notes (Signed)
Wasted 100 cc Fentanyl with Jenny Reichmann, [facility]

## 2018-08-25 NOTE — Progress Notes (Signed)
PHARMACY - ADULT TOTAL PARENTERAL NUTRITION CONSULT NOTE   Pharmacy Consult for TPN Indication: s/p ex lap with SBR for SBO and ischemic/necroticbowel  Patient Measurements: Height: 5\' 4"  (162.6 cm) Weight: 160 lb 7.9 oz (72.8 kg) IBW/kg (Calculated) : 54.7 TPN AdjBW (KG): 64.8 Body mass index is 27.55 kg/m.  Assessment:  Patient with PMH significant for anemia, CAD s/p AICD placement, breast cancer, CKD III, CHF, HLD, impaired hearing, and RA. Presented to [facility] on 4/23 with CAP and SBO.    4/28- CT scan showed persistent SBO, tx to [facility] for surgery 4/30- ex lap, lysis of adhesions, small bowel resection due to ischemic changes   5/4 is POD#4, but day #12 without nutrition. 5/4 -TPN (consult placed). Cont bowel rest & NG till bowel function returns.  GI: NGT output 200 mls, JP drain 45 mls Endo: No history of diabetes, will initiate sensitive SSI with TPN Insulin requirements in the past 24 hours: 0 Lytes: K 3.7, corr Ca 9.6 (alb 1.3) Renal: Scr 3.19, BUN 65, now off CRRT - holding on dialysis will try diuretics Pulm:  - 1 L Cards: on Am,iodarone drip (s/p VT arrest earlier in admission); HR paced at 89, BP 99-120/49-65 Hepatobil: AST/ALT 55/10; T Bili 1.8 Neuro: oriented to person only, complains of abdominal pain ID: WBC 8.9, afebrile on Zosyn 5/2>>  TPN Access: CVC triple lumen placed 4/30 TPN start date: 5/4 Nutritional Goals (per RD recommendation on 5/1): Kcal:  1337 kcal Protein:  105-125 grams Fluid:  >/= 1.3 L/day   Goal TPN rate is 55 ml/hr (provides 110 g of protein, 158.4 g of dextrose, and 39.8 g of lipids which provides 1336 kCals per day, meeting 100% of patient needs)  Current Nutrition:   Plan:  Initiate TPN at 30 mL/hr. Hold 20% lipid emulsion for first 7 days for ICU patients per ASPEN guidelines (Start date 5/10) This TPN provides 57.6 g of protein and 86.4 g of dextrose which provides 524 kCals per day, meeting 50% of patient's protein needs and  40% of the patients calorie needs Electrolytes in TPN: Given renal dysfunction, will remove all electrolytes Add MVI to TPN - trace elements are on back order, will only place in TPN on Monday, Wednesday and Friday Initiate sensitive SSI and adjust as needed Monitor TPN labs F/U renal function and diuresis   Alanda Slim, PharmD, [facility] Clinical Pharmacist Please see AMION for all Pharmacists' Contact Phone Numbers 08/25/2018, 9:31 AM

## 2018-08-25 NOTE — Progress Notes (Signed)
Patient arrived the unit from [facility] on a hospital bed, vital signs are stable, placed on tele ccmd notified,patient oriented to room and staff, bed in lowest position, call bell within reach will continue to monitor.

## 2018-08-25 NOTE — Evaluation (Addendum)
Physical Therapy Evaluation Patient Details Name: [name] MRN: [id number] DOB: [birth year] Today's Date: 08/25/2018   History of Present Illness  78 year old female with significant cardiac history that is post exploratory laparotomy for small bowel obstruction with associated ischemic bowel on 4/30, return to the intensive care on mechanical ventilator, critical care asked to assist with care.  VDRF 4/30-5/3.  Also PNA.  EKC:[id number] heart failure with EF 30%, nonsustained VT, pericardial effusion CAD w/ prior AICD, diastolic heart failure, CKD stage III, hypertension, rheumatoid arthritis, anemia of chronic disease.  Clinical Impression  Pt admitted with above diagnosis. Pt currently with functional limitations due to the deficits listed below (see PT Problem List). Pt was able to stand and pivot to recliner with +2 mod assist and cues with use of pad to assist.  Pt deconditioned and feel that a SNF stay would benefit pt to gain strength and endurance.   Called pts husband and obtained home info.  Pt was Modif I PTA.  Husband does work so that is why PT recommends SNF to give pt time to recover. Husband prefers SNF in [place] if possible.  Pt will benefit from skilled PT to increase their independence and safety with mobility to allow discharge to the venue listed below.      Follow Up Recommendations SNF;Supervision/Assistance - 24 hour    Equipment Recommendations  Other (comment)(TBA)    Recommendations for Other Services       Precautions / Restrictions Precautions Precautions: Fall Precaution Comments: JP drain Restrictions Weight Bearing Restrictions: No      Mobility  Bed Mobility Overal bed mobility: Needs Assistance Bed Mobility: Supine to Sit     Supine to sit: Max assist;+2 for physical assistance;HOB elevated     General bed mobility comments: Needed assist for LEs and elevation of trunk.   Transfers Overall transfer level: Needs assistance Equipment  used: 2 person hand held assist Transfers: Sit to/from [facility] Sit to Stand: Mod assist;+2 physical assistance;From elevated surface Stand pivot transfers: Mod assist;+2 physical assistance;From elevated surface       General transfer comment: Used pad to assist with power up.  Pt able to take pivotal steps around with cues and used pad and support to guide pt around to chair.    Ambulation/Gait                Stairs            Wheelchair Mobility    Modified Rankin (Stroke Patients Only)       Balance Overall balance assessment: Needs assistance Sitting-balance support: Feet supported;Bilateral upper extremity supported Sitting balance-Leahy Scale: Poor Sitting balance - Comments: Initially needed mod assist to sit EOB but did progress to min guard assist sitting a total of 8 min. Posterior lean at times.   Postural control: Posterior lean Standing balance support: Bilateral upper extremity supported;During functional activity Standing balance-Leahy Scale: Poor Standing balance comment: Mod assist of 2 persons with use of pad to stand up and not standing fully upright.                             Pertinent Vitals/Pain Pain Assessment: Faces Faces Pain Scale: Hurts even more Pain Location: stomach Pain Descriptors / Indicators: Aching;Grimacing;Guarding Pain Intervention(s): Limited activity within patient's tolerance;Monitored during session;Repositioned    Home Living Family/patient expects to be discharged to:: Private residence Living Arrangements: Spouse/significant other Available Help at Discharge: Family;Available PRN/intermittently(husband  works fulltime) Type of Home: Mobile home Home Access: Stairs to enter Entrance Stairs-Rails: Right;Left;Can reach both Technical brewer of Steps: [place]: One [place]: Environmental consultant - 2 wheels;Shower seat;Hand held shower head;Toilet riser      Prior Function  Level of Independence: Independent with assistive device(s);Needs assistance   Gait / Transfers Assistance Needed: Used RW Modif I  ADL's / Homemaking Assistance Needed: Bathe and dress self, groom self, did cooking  Comments: Still driving     Hand Dominance   Dominant Hand: Right    Extremity/Trunk Assessment   Upper Extremity Assessment Upper Extremity Assessment: Defer to OT evaluation    Lower Extremity Assessment Lower Extremity Assessment: Generalized weakness    Cervical / Trunk Assessment Cervical / Trunk Assessment: Kyphotic  Communication   Communication: HOH  Cognition Arousal/Alertness: Awake/alert Behavior During Therapy: Flat affect Overall Cognitive Status: Impaired/Different from baseline Area of Impairment: Orientation;Memory;Following commands;Safety/judgement;Awareness;Problem solving                 Orientation Level: Disoriented to;Place;Time;Situation   Memory: Decreased short-term memory;Decreased recall of precautions Following Commands: Follows one step commands with increased time Safety/Judgement: Decreased awareness of safety;Decreased awareness of deficits   Problem Solving: Slow processing;Decreased initiation;Difficulty sequencing;Requires verbal cues;Requires tactile cues        General Comments General comments (skin integrity, edema, etc.): HR 90 bpm, O2 on .5L 97% on arrival. Did desat to 88% on RA with activity therefore placed 2LO2 and sats 97%.  LEft on .5L with sats 96%.  BP at end of treatment 142/76.      Exercises     Assessment/Plan    PT Assessment Patient needs continued PT services  PT Problem List Decreased activity tolerance;Decreased balance;Decreased mobility;Decreased knowledge of use of DME;Decreased safety awareness;Decreased knowledge of precautions       PT Treatment Interventions DME instruction;Gait training;Functional mobility training;Therapeutic activities;Therapeutic exercise;Balance  training;Patient/family education    PT Goals (Current goals can be found in the Care Plan section)  Acute Rehab PT Goals Patient Stated Goal: to go home PT Goal Formulation: With patient Time For Goal Achievement: 09/08/18 Potential to Achieve Goals: Good    Frequency Min 2X/week   Barriers to discharge        Co-evaluation PT/OT/SLP Co-Evaluation/Treatment: Yes Reason for Co-Treatment: Complexity of the patient's impairments (multi-system involvement);For patient/therapist safety PT goals addressed during session: Mobility/safety with mobility         AM-PAC PT "6 Clicks" Mobility  Outcome Measure Help needed turning from your back to your side while in a flat bed without using bedrails?: A Lot Help needed moving from lying on your back to sitting on the side of a flat bed without using bedrails?: A Lot Help needed moving to and from a bed to a chair (including a wheelchair)?: A Lot Help needed standing up from a chair using your arms (e.g., wheelchair or bedside chair)?: A Lot Help needed to walk in hospital room?: Total Help needed climbing 3-5 steps with a railing? : Total 6 Click Score: 10    End of Session Equipment Utilized During Treatment: Gait belt;Oxygen Activity Tolerance: Patient limited by fatigue;Patient limited by pain Patient left: in chair;with call bell/phone within reach;with chair alarm set Nurse Communication: Mobility status;Need for lift equipment PT Visit Diagnosis: Unsteadiness on feet (R26.81);Muscle weakness (generalized) (M62.81)    Time: 2585-2778 PT Time Calculation (min) (ACUTE ONLY): 23 min   Charges:   PT Evaluation $PT Eval Moderate Complexity: 1 Mod  Sekou Zuckerman,PT Acute Rehabilitation Services Pager:  [phone]  Office:  [place] 08/25/2018, 11:51 AM

## 2018-08-25 NOTE — Progress Notes (Signed)
4 Days Post-Op  Subjective: CC: Abdominal Pain Patient extubated yesterday. Off pressors. Nurse reports she did well overnight and VSS. Had a BM 3 days ago but none since. Patient denies flatus. Awake today and responds to questioning. Orientated to self, not place or time. Currently reports some abdominal pain where her midline incision is. She is unsure of nausea.   Objective: Vital signs in last 24 hours: Temp:  [96.5 F (35.8 C)-98 F (36.7 C)] 98 F (36.7 C) (05/04 0400) Pulse Rate:  [88-89] 89 (05/04 0700) Resp:  [11-22] 16 (05/04 0700) BP: (108-127)/(48-94) 115/55 (05/04 0700) SpO2:  [97 %-100 %] 98 % (05/04 0700) Arterial Line BP: (111-156)/(29-44) 150/38 (05/04 0700) FiO2 (%):  [40 %] 40 % (05/03 0814) Last BM Date: 08/22/18  Intake/Output from previous day: 05/03 0701 - 05/04 0700 In: 808.7 [I.V.:658.7; IV Piggyback:150] Out: 1160 [Urine:915; Emesis/NG output:200; Drains:45] Intake/Output this shift: No intake/output data recorded.  PE: Gen: Awake and alert, NAD. Responding to questioning Heart: Regular rate, 90's while in the room Lungs: Normal effort. On O2. CTA b/l Abd: Soft, mild distension, hypoactive BS, appropriately tender without peritonitis. Midline wound with light pink granulation tissue. No false bottom or tracking. No wound dehiscence. No signs of infection. See picture below. JP drain in place. 45cc output overnight, bloody serosanguinous fluid.  Msk: Trace edema      Lab Results:  Recent Labs    08/24/18 0444 08/25/18 0415  WBC 25.4* 13.8*  HGB 8.2* 7.8*  HCT 25.1* 23.5*  PLT 102* 100*   BMET Recent Labs    08/24/18 0444 08/25/18 0415  NA 147* 148*  K 4.4 3.7  CL 119* 120*  CO2 16* 17*  GLUCOSE 93 65*  BUN 68* 65*  CREATININE 3.21* 3.19*  CALCIUM 7.6* 7.4*   PT/INR No results for input(s): LABPROT, INR in the last 72 hours. CMP     Component Value Date/Time   NA 148 (H) 08/25/2018 0415   K 3.7 08/25/2018 0415   CL 120  (H) 08/25/2018 0415   CO2 17 (L) 08/25/2018 0415   GLUCOSE 65 (L) 08/25/2018 0415   BUN 65 (H) 08/25/2018 0415   CREATININE 3.19 (H) 08/25/2018 0415   CREATININE 1.07 (H) 02/13/2018 1142   CALCIUM 7.4 (L) 08/25/2018 0415   PROT 4.6 (L) 08/24/2018 0444   ALBUMIN 1.3 (L) 08/24/2018 0444   AST 55 (H) 08/24/2018 0444   ALT 10 08/24/2018 0444   ALKPHOS 201 (H) 08/24/2018 0444   BILITOT 1.8 (H) 08/24/2018 0444   GFRNONAA 13 (L) 08/25/2018 0415   GFRNONAA 49 (L) 02/13/2018 1142   GFRAA 15 (L) 08/25/2018 0415   GFRAA 57 (L) 02/13/2018 1142   Lipase     Component Value Date/Time   LIPASE 30 08/13/2018 1926       Studies/Results: Dg Chest Port 1 View  Result Date: 08/25/2018 CLINICAL DATA:  Acute respiratory failure with hypoxia. EXAM: PORTABLE CHEST 1 VIEW COMPARISON:  Chest x-ray from Aug 23, 2018. FINDINGS: Interval removal of the endotracheal tube. Unchanged enteric tube and right internal jugular central venous catheter. Unchanged left chest wall pacemaker. Stable cardiomegaly. New mild pulmonary vascular congestion. Unchanged bibasilar atelectasis and small bilateral pleural effusions. No pneumothorax. No acute osseous abnormality. IMPRESSION: 1. New mild pulmonary vascular congestion. 2. Unchanged bibasilar atelectasis small bilateral pleural effusions. Electronically Signed   By: Titus Dubin M.D.   On: 08/25/2018 07:11    Anti-infectives: Anti-infectives (From admission, onward)   Start  Dose/Rate Route Frequency Ordered Stop   08/23/18 1400  piperacillin-tazobactam (ZOSYN) IVPB 2.25 g     2.25 g 100 mL/hr over 30 Minutes Intravenous Every 8 hours 08/23/18 0928     08/21/18 1200  piperacillin-tazobactam (ZOSYN) IVPB 3.375 g  Status:  Discontinued     3.375 g 12.5 mL/hr over 240 Minutes Intravenous Every 8 hours 08/21/18 1102 08/23/18 0928   08/14/18 1000  hydroxychloroquine (PLAQUENIL) tablet 200 mg  Status:  Discontinued    Note to Pharmacy:  Take 1 tablet by mouth  daily Monday through Friday only     200 mg Oral Once per day on Mon Tue Wed Thu Fri 08/14/18 0742 08/21/18 1147   08/13/18 2200  doxycycline (VIBRA-TABS) tablet 100 mg  Status:  Discontinued     100 mg Oral Every 12 hours 08/13/18 2134 08/13/18 2135   08/13/18 2200  doxycycline (VIBRAMYCIN) 100 mg in sodium chloride 0.9 % 250 mL IVPB  Status:  Discontinued     100 mg 125 mL/hr over 120 Minutes Intravenous Every 12 hours 08/13/18 2135 08/19/18 1702   08/13/18 2145  cefTRIAXone (ROCEPHIN) 1 g in sodium chloride 0.9 % 100 mL IVPB  Status:  Discontinued     1 g 200 mL/hr over 30 Minutes Intravenous Every 24 hours 08/13/18 2134 08/14/18 0743       Assessment/Plan  POD 4, s/p ex lap with SBR for SBO and ischemic/necroticbowel, 4/30 - PT -Extubated. Off pressors. Appreciate CCMs help in her care -TPN (consult placed). Cont bowel rest & NG till bowel function returns.  -Switch from WTD dressing changes to abdominal wound vac. Consult placed.  -cont JP drain for now -cont zosyn -Path benign. 58cm SB w/ hemorrhage and prominent necrosis w/ viable margins.   Acute on chronic renal failure - cont foley for accurate I&Os - UOP increased (915cc overnight). Cr stable/slightly improved at 3.19 from 3.21 - cont fluids, but has a history of CHF as well. Defer fluid balance to CCM  ICD/CAD/CHF -per cardiology.  -on amio  Acute Respiratory Failure - Off vent. Improving. Per CCM  FEN - PICC/TPN until reliable bowel function VTE -Lovenox/SCD ID -zosyn 4/30 >> WBC downtrending 47.6>31.8>25.4>13.8; afebrile    LOS: 12 days    Jillyn Ledger , [facility] Surgery 08/25/2018, 8:08 AM Pager: [phone]

## 2018-08-25 NOTE — Progress Notes (Addendum)
NAME:  [name], MRN:  [id number], DOB:  [birth year], LOS: 12 ADMISSION DATE:  08/13/2018, CONSULTATION DATE:  4/20 REFERRING MD:  Renne Crigler , CHIEF COMPLAINT:  Vent management    Brief History   78 year old female with significant cardiac history that is post exploratory laparotomy for small bowel obstruction with associated ischemic bowel on 4/30, return to the intensive care on mechanical ventilator, critical care asked to assist with care  Past Medical History  Congestive heart failure with EF 30%, nonsustained VT, pericardial effusion CAD w/ prior AICD, diastolic heart failure, CKD stage III, hypertension, rheumatoid arthritis, anemia of chronic disease.  Significant Hospital Events   4/22 Admitted to [facility] with small bowel obstruction.  Also started on antibiotics for possible PNA 4/28 ongoing high-grade bowel obstruction 4/29 arrived at [facility] 4/30 Ex-lap with lysis of adhesion, bowel resection for ischemic bowel return to ICU on ventilator 4/30 Shocked for VT 5/01 Remains on 40 mcg levophed, CVP 4, AKI, less JP drainage.   5/02 On/off vasopressin, AKI/oliguria 5/03 Extubated  Consults:  Radiology 4/29  Procedures:  R Fem ALine 4/30 >>   Significant Diagnostic Tests:  CT imaging 4/29 >> Persistent small bowel obstruction  Micro Data:  BCx2 4/22 >> neg UC 4/22 >> neg  Antimicrobials:  Doxy 4/22 >> 4/28 Zosyn 4/30 >>  Interim history/subjective:  Extubated yesterday. On 1L . S  Objective   Blood pressure (Abnormal) 115/55, pulse 89, temperature 97.6 F (36.4 C), temperature source Oral, resp. rate 16, height 5\' 4"  (1.626 m), weight 72.8 kg, SpO2 98 %. CVP:  [3 mmHg-9 mmHg] 8 mmHg      Intake/Output Summary (Last 24 hours) at 08/25/2018 1016 Last data filed at 08/25/2018 0700 Gross per 24 hour  Intake 723.04 ml  Output 1075 ml  Net -351.96 ml   Filed Weights   08/14/18 0500 08/22/18 0625 08/24/18 0500  Weight: 64.8 kg 70.3 kg 72.8 kg    Examination:  General frail 78 year old female resting in chair. No distress HENT NCAT no JVD right IJ CVL  pulm decreased bases no accessory use Card RRR  abd dressing intact Ext no sig edema brisk CR good pulses Neuro very hard of hearing no focal def    Resolved Hospital Problem list   RLL PNA - treated with 7 days doxycycline  Shock (ABL anemia and sepsis 2/2 ischemic bowel) Assessment & Plan:    Small Bowel Obstruction s/p Exploratory Laparotomy -initial admit 4/22 at [facility], failed conservative therapy  Plan Bowel rest Day 5/x zosyn TNA Mobilize   Acute Respiratory Insufficiency -post op exploratory laparotomy  Small R Effusion / Atelectasis Successfully extubated 5/3 Plan pulm hygiene PRN CXR  Mobilize   Chronic Systolic / Diastolic CHF  -LVEF 54-00%, diffuse hypokinesis 08/19/18, AICD in place AF/Flutter, VT -s/p cardioversion for wide complex tachycardia 4/30 pm Plan Appreciate CHF service  Cont amiodarone Additional recs per cards  Acute on chronic renal failure  CKD III  Scr improving  Plan Ensure normal BP Renal dose meds Am chemistry   Fluid and electrolyte imbalance: mild hypernatremia, hyperchloremia w/ NAG metabolic acidosis  Plan Will add free water replacement   At Risk Malnutrition  Plan Starting TPN Cont bowel rest per surg   RA on Plaquenil Plan Holding plaquenil for now   Anemia -ABL + ACD; slow trend down on CBC but no evidence of bleeding currently  Plan Trend cbc Transfuse for hgb < 7  Thrombocytopenia  Plan Trend cbc  Best practice:  Diet: TPN, NPO Pain/Anxiety/Delirium protocol (if indicated): N/A VAP protocol (if indicated): N/A DVT prophylaxis: SCD's, heparin GI prophylaxis: Home PPI Glucose control: n/a Mobility: bedrest  Code Status: Full Code  Family Communication: CCM attending updated husband on 5/4 Disposition: move out of ICU cont supportive care will ask triad to assume care   Erick Colace ACNP-BC [place] Pager # [phone] OR # [phone] if no answer   I have taken an interval history, reviewed the chart and examined the patient. I agree with the Advanced Practitioner's note, impression, and recommendations as outlined.   Briefly, 78 year old female admitted to [facility] with SBO who failed conservative management and transferred to [facility] for ex-lap/bowel resection on 9/93 which was complicated by VT requiring defib.  Physical Exam: General: Very frail-appearing female, no acute distress HENT: [place], AT, OP clear, MMM Eyes: EOMI, no scleral icterus Respiratory: Diminished breath sounds bilaterally. No crackles, wheezing or rales Cardiovascular: RRR, -M/R/G, no JVD GI: Abdominal dressing in place, BS+, soft, nontender Extremities:-Edema,-tenderness Neuro: Awake and alert  Post-op respiratory insufficiency Extubated yesterday and tolerating  PT/OT  Septic and hypovolemic shock - resolved Zosyn per Surgery  AoCKD Elevated but stable Cr. Improved UOP Lasix 20 mg once  Chronic systolic and diastolic heart failure AF/Aflutter VT S/p cardioversion 4/30 IV Amio Lasix 20 mg once as above  SBO Post-op management per Surgery TPN  Dispo: Transfer to [place] Time devoted to patient care services described in this note is 35 Minutes. This time reflects time of care of this signee Dr. Rodman Pickle. This critical care time does not reflect procedure time, or teaching time or supervisory time of PA/NP/Med student/Med Resident etc but could involve care discussion time. Called and updated husband regarding patient's current medical condition. Reconciled medications  Rodman Pickle, M.D. [facility] Pulmonary/Critical Care Medicine Pager: [phone] After hours pager: [phone]

## 2018-08-26 LAB — DIFFERENTIAL
Abs Immature Granulocytes: 0.12 10*3/uL — ABNORMAL HIGH (ref 0.00–0.07)
Basophils Absolute: 0 10*3/uL (ref 0.0–0.1)
Basophils Relative: 0 %
Eosinophils Absolute: 0.1 10*3/uL (ref 0.0–0.5)
Eosinophils Relative: 1 %
Immature Granulocytes: 1 %
Lymphocytes Relative: 8 %
Lymphs Abs: 0.8 10*3/uL (ref 0.7–4.0)
Monocytes Absolute: 0.6 10*3/uL (ref 0.1–1.0)
Monocytes Relative: 6 %
Neutro Abs: 8.5 10*3/uL — ABNORMAL HIGH (ref 1.7–7.7)
Neutrophils Relative %: 84 %

## 2018-08-26 LAB — MAGNESIUM: Magnesium: 2.2 mg/dL (ref 1.7–2.4)

## 2018-08-26 LAB — GLUCOSE, CAPILLARY
Glucose-Capillary: 114 mg/dL — ABNORMAL HIGH (ref 70–99)
Glucose-Capillary: 117 mg/dL — ABNORMAL HIGH (ref 70–99)
Glucose-Capillary: 121 mg/dL — ABNORMAL HIGH (ref 70–99)
Glucose-Capillary: 129 mg/dL — ABNORMAL HIGH (ref 70–99)
Glucose-Capillary: 136 mg/dL — ABNORMAL HIGH (ref 70–99)

## 2018-08-26 LAB — COMPREHENSIVE METABOLIC PANEL
ALT: 13 U/L (ref 0–44)
AST: 60 U/L — ABNORMAL HIGH (ref 15–41)
Albumin: 1.2 g/dL — ABNORMAL LOW (ref 3.5–5.0)
Alkaline Phosphatase: 226 U/L — ABNORMAL HIGH (ref 38–126)
Anion gap: 13 (ref 5–15)
BUN: 63 mg/dL — ABNORMAL HIGH (ref 8–23)
CO2: 19 mmol/L — ABNORMAL LOW (ref 22–32)
Calcium: 7.6 mg/dL — ABNORMAL LOW (ref 8.9–10.3)
Chloride: 115 mmol/L — ABNORMAL HIGH (ref 98–111)
Creatinine, Ser: 3.02 mg/dL — ABNORMAL HIGH (ref 0.44–1.00)
GFR calc Af Amer: 16 mL/min — ABNORMAL LOW (ref >60)
GFR calc non Af Amer: 14 mL/min — ABNORMAL LOW (ref >60)
Glucose, Bld: 131 mg/dL — ABNORMAL HIGH (ref 70–99)
Potassium: 2.6 mmol/L — CL (ref 3.5–5.1)
Sodium: 147 mmol/L — ABNORMAL HIGH (ref 135–145)
Total Bilirubin: 2.1 mg/dL — ABNORMAL HIGH (ref 0.3–1.2)
Total Protein: 4.9 g/dL — ABNORMAL LOW (ref 6.5–8.1)

## 2018-08-26 LAB — CBC
HCT: 24.1 % — ABNORMAL LOW (ref 36.0–46.0)
Hemoglobin: 8.3 g/dL — ABNORMAL LOW (ref 12.0–15.0)
MCH: 26.5 pg (ref 26.0–34.0)
MCHC: 34.4 g/dL (ref 30.0–36.0)
MCV: 77 fL — ABNORMAL LOW (ref 80.0–100.0)
Platelets: 92 10*3/uL — ABNORMAL LOW (ref 150–400)
RBC: 3.13 MIL/uL — ABNORMAL LOW (ref 3.87–5.11)
RDW: 19.6 % — ABNORMAL HIGH (ref 11.5–15.5)
WBC: 10.2 10*3/uL (ref 4.0–10.5)
nRBC: 0 % (ref 0.0–0.2)

## 2018-08-26 LAB — TRIGLYCERIDES: Triglycerides: 78 mg/dL (ref <150)

## 2018-08-26 LAB — PHOSPHORUS: Phosphorus: 3.4 mg/dL (ref 2.5–4.6)

## 2018-08-26 LAB — PREALBUMIN: Prealbumin: <5 mg/dL — ABNORMAL LOW (ref 18–38)

## 2018-08-26 MED ORDER — TRAVASOL 10 % IV SOLN
INTRAVENOUS | Status: AC
  Administered 2018-08-26: 18:00:00 via INTRAVENOUS
  Filled 2018-08-26: qty 825

## 2018-08-26 MED ORDER — SODIUM CHLORIDE 0.45 % IV SOLN
INTRAVENOUS | Status: DC
  Administered 2018-08-26: 17:00:00 via INTRAVENOUS
  Filled 2018-08-26: qty 1000

## 2018-08-26 MED ORDER — POTASSIUM CHLORIDE IN NACL 40-0.9 MEQ/L-% IV SOLN
INTRAVENOUS | Status: DC
  Filled 2018-08-26: qty 1000

## 2018-08-26 MED ORDER — POTASSIUM CHLORIDE 10 MEQ/50ML IV SOLN
10.0000 meq | INTRAVENOUS | Status: AC
  Administered 2018-08-26 (×5): 10 meq via INTRAVENOUS
  Filled 2018-08-26 (×5): qty 50

## 2018-08-26 NOTE — Progress Notes (Signed)
Progress Note  Patient Name: [name] Date of Encounter: 08/26/2018  Primary Cardiologist: Kate Sable, MD   Subjective   Resting comfortably  Inpatient Medications    Scheduled Meds: . chlorhexidine  15 mL Mouth Rinse BID  . heparin injection (subcutaneous)  5,000 Units Subcutaneous Q8H  . insulin aspart  0-9 Units Subcutaneous Q4H  . mouth rinse  15 mL Mouth Rinse q12n4p  . mupirocin ointment   Nasal BID  . pantoprazole (PROTONIX) IV  40 mg Intravenous QHS   Continuous Infusions: . amiodarone 30 mg/hr (08/26/18 1147)  . piperacillin-tazobactam (ZOSYN)  IV 2.25 g (08/26/18 1420)  . sodium chloride 0.45 % with kcl    . TPN ADULT (ION) 30 mL/hr at 08/25/18 1818  . TPN ADULT (ION)     PRN Meds: acetaminophen **OR** acetaminophen, ondansetron **OR** ondansetron (ZOFRAN) IV   Vital Signs    Vitals:   08/25/18 1958 08/25/18 2300 08/26/18 0257 08/26/18 0859  BP: (!) 131/55 (!) 124/59 (!) 141/65 (!) 127/53  Pulse: 89 87 89 89  Resp: (!) 21 (!) 25 17 (!) 34  Temp: 97.6 F (36.4 C) 98 F (36.7 C) 97.8 F (36.6 C) 98 F (36.7 C)  TempSrc: Oral Oral Oral Axillary  SpO2: 95% 95% 98% 94%  Weight:      Height:        Intake/Output Summary (Last 24 hours) at 08/26/2018 1544 Last data filed at 08/26/2018 1016 Gross per 24 hour  Intake 160.79 ml  Output 2290 ml  Net -2129.21 ml   Last 3 Weights 08/24/2018 08/22/2018 08/14/2018  Weight (lbs) 160 lb 7.9 oz 154 lb 15.7 oz 142 lb 13.7 oz  Weight (kg) 72.8 kg 70.3 kg 64.8 kg      Telemetry    AV sequential pacing 90 bpm - Personally Reviewed  Physical Exam  Elderly woman resting comfortably GEN: No acute distress.    Labs    Chemistry Recent Labs  Lab 08/21/18 1202  08/24/18 0444 08/25/18 0415 08/26/18 0500  NA 147*   < > 147* 148* 147*  K 4.5   < > 4.4 3.7 2.6*  CL 122*   < > 119* 120* 115*  CO2 16*   < > 16* 17* 19*  GLUCOSE 90   < > 93 65* 131*  BUN 42*   < > 68* 65* 63*  CREATININE 1.26*   < >  3.21* 3.19* 3.02*  CALCIUM 8.1*   < > 7.6* 7.4* 7.6*  PROT 5.1*  --  4.6*  --  4.9*  ALBUMIN 2.2*  --  1.3*  --  1.2*  AST 32  --  55*  --  60*  ALT 16  --  10  --  13  ALKPHOS 88  --  201*  --  226*  BILITOT 1.8*  --  1.8*  --  2.1*  GFRNONAA 40*   < > 13* 13* 14*  GFRAA 47*   < > 15* 15* 16*  ANIONGAP 9   < > 12 11 13    < > = values in this interval not displayed.     Hematology Recent Labs  Lab 08/24/18 0444 08/25/18 0415 08/26/18 0500  WBC 25.4* 13.8* 10.2  RBC 3.03* 2.92* 3.13*  HGB 8.2* 7.8* 8.3*  HCT 25.1* 23.5* 24.1*  MCV 82.8 80.5 77.0*  MCH 27.1 26.7 26.5  MCHC 32.7 33.2 34.4  RDW 20.0* 19.9* 19.6*  PLT 102* 100* 92*    Cardiac  Enzymes Recent Labs  Lab 08/21/18 1202 08/21/18 1925 08/22/18 0401  TROPONINI 0.05* 0.08* 0.11*   No results for input(s): TROPIPOC in the last 168 hours.   BNPNo results for input(s): BNP, PROBNP in the last 168 hours.   DDimer No results for input(s): DDIMER in the last 168 hours.   Radiology    Dg Chest Port 1 View  Result Date: 08/25/2018 CLINICAL DATA:  Acute respiratory failure with hypoxia. EXAM: PORTABLE CHEST 1 VIEW COMPARISON:  Chest x-ray from Aug 23, 2018. FINDINGS: Interval removal of the endotracheal tube. Unchanged enteric tube and right internal jugular central venous catheter. Unchanged left chest wall pacemaker. Stable cardiomegaly. New mild pulmonary vascular congestion. Unchanged bibasilar atelectasis and small bilateral pleural effusions. No pneumothorax. No acute osseous abnormality. IMPRESSION: 1. New mild pulmonary vascular congestion. 2. Unchanged bibasilar atelectasis small bilateral pleural effusions. Electronically Signed   By: Titus Dubin M.D.   On: 08/25/2018 07:11     Patient Profile     78 y.o. female with a history of chronic combined systolic and diastolic CHFdue to iCMwith EF of 30%on Echo this admits/p ICD, CAD with remote MI, hypertension, hyperlipidemia, CKD stage III, chronic anemia,  and GERDwhounderwent ex-lap for SBO on 4/30. Transferred to ICU post-op for severe hypotension and shock  Assessment & Plan    1.  Acute systolic heart failure: The patient's creatinine remained stable with acute kidney injury likely from ATN.  Avoid ACE/ARB/Aldactone.  Follow renal function and initiate diuresis as renal function improves.  2.  Paroxysmal atrial tachycardia: Continue IV amiodarone while she is not taking p.o. medicines.  Convert to oral amiodarone when taking p.o.  Heart rhythm is stable telemetry is reviewed.  For questions or updates, please contact [place] Please consult [email or web address] for contact info under        Signed, Sherren Mocha, MD  08/26/2018, 3:44 PM

## 2018-08-26 NOTE — Progress Notes (Signed)
08/26/2018 1100 NGT clamped per MD orders.  Will monitor for nausea/vomiting. [place]

## 2018-08-26 NOTE — Progress Notes (Addendum)
PHARMACY - ADULT TOTAL PARENTERAL NUTRITION CONSULT NOTE   Pharmacy Consult for TPN Indication: s/p ex lap with SBR for SBO and ischemic/necroticbowel  Patient Measurements: Height: 5\' 4"  (162.6 cm) Weight: 160 lb 7.9 oz (72.8 kg) IBW/kg (Calculated) : 54.7 TPN AdjBW (KG): 64.8 Body mass index is 27.55 kg/m.  Assessment:  78 year old female with PMH significant for anemia, CAD s/p AICD placement, breast cancer, CKD III, CHF, HLD, impaired hearing, and RA. She presented to [facility] on 4/23 with CAP and SBO. CT on 4/28 show persistent SBO and patient was transferred to [facility] for surgery. On 4/30, an ex lap, lysis of adhesions and small bowel resection was performed. As of 5/4, Patient has been ~12 days without nutrition and pharmacy was consulted 5/4 for TPN with plan for continued bowel rest and NG until bowel function returns.  GI: NGT output decreased at 5 mls, JP drain 45 mls. PPI IV. Pre-albumin <5. LBM 5/5 x2. Endo: No history of diabetes. CBGs 114-131 with start of TPN + D5W at 30 mL/hr. Insulin requirements in the past 24 hours: 1 unit Lytes: K dropped to 2.6 s/p Lasix 20 IV x1 on 5/4 (replacing with 5 runs this AM); Mg and Phos are wnl; Corr Ca 9.8 (alb 1.2), Na/Cl elevated, CO2 low but trending up Renal: Scr 3.02 (trending down), BUN 63, now off CRRT - holding on dialysis will try diuretics. UOP 1.3 mL/kg/hr. Pulm: Extubated on 5/3. RA Cards: Amiodarone drip at 30 mg/hr (s/p VT arrest earlier in admission); HR paced at 89, BP 130-140/50-60s Hepatobil: AST/ALT 60/13 (bumping up); T Bili up 2.1. TG 78. Neuro: oriented to person only, complains of abdominal pain ID: WBC 10.2, afebrile on Zosyn 5/2>>present.   TPN Access: CVC triple lumen placed 4/30 TPN start date: 5/4 Nutritional Goals (per RD recommendation on 5/4, re-estimated post extubation): Kcal: 2000-2200 kcal Protein:  100-120 grams Fluid:  >/= 2 L/day   Goal TPN rate is 80 ml/hr (provides 120 g of protein, 270 g of  dextrose, and 60 g of lipids which provides 2000 kCals per day, meeting 100% of patient needs)  Current Nutrition:   Plan:  Increase TPN to 55 mL/hr.  This TPN provides  82.5g of protein, 186g of dextrose, and 41.1g of lipids which provides 1374 kCals per day, meeting 69% of patient's protein and calorie needs. Electrolytes in TPN: Given renal dysfunction, will remove all electrolytes. Monitor for need to add back to TPN.  Add MVI to TPN - trace elements are on back order, will only place in TPN on Monday, Wednesday and Friday Continue sensitive SSI and adjust as needed Continue D5W at current rate of 30 mL/hr. Monitor TPN labs - will check BMET, Mg, and Phos Follow-up renal function and diuresis  Sloan Leiter, PharmD, BCPS, BCCCP Clinical Pharmacist Please refer to [facility] for [place] numbers 08/26/2018, 7:39 AM

## 2018-08-26 NOTE — Evaluation (Signed)
Clinical/Bedside Swallow Evaluation Patient Details  Name: [name] MRN: [id number] Date of Birth: [birth year]  Today's Date: 08/26/2018 Time: SLP Start Time (ACUTE ONLY): 1455 SLP Stop Time (ACUTE ONLY): 1510 SLP Time Calculation (min) (ACUTE ONLY): 15 min  Past Medical History:  Past Medical History:  Diagnosis Date  . Anemia    Hgb of 9-10  . Anemia of chronic disease    Hgb of 9-10 chronically; 06/2010: H&H-10.7/33.5, MCV-81, normal iron studies in 2010   . Arteriosclerotic cardiovascular disease (ASCVD)    Remote PTCA by patient report; LBBB; associated cardiomyopathy, presumed ischemic with EF 40-45% previously, 20% in 06/2009; h/o clinical congestive heart failure; negative stress nuclear in 2009 with inferoseptal and apical scar  . Automatic implantable cardioverter-defibrillator in situ   . Breast cancer ([place])    breast  . Chronic bronchitis ([place])   . Chronic renal disease, stage 3, moderately decreased glomerular filtration rate (GFR) between 30-59 mL/min/1.73 square meter (HCC) 02/01/2016  . Congestive heart failure (CHF) ([place])   . Elevated cholesterol   . Elevated sed rate   . GERD (gastroesophageal reflux disease)   . GI bleed   . Gout   . HOH (hard of hearing)   . Hyperlipidemia   . Hypertension   . Rheumatoid arthritis ([place])   . UTI (urinary tract infection)    Past Surgical History:  Past Surgical History:  Procedure Laterality Date  . BI-VENTRICULAR IMPLANTABLE CARDIOVERTER DEFIBRILLATOR N/A 07/28/2012   Procedure: BI-VENTRICULAR IMPLANTABLE CARDIOVERTER DEFIBRILLATOR  (CRT-D);  Surgeon: Evans Lance, MD;  Location: [facility] CATH LAB;  Service: Cardiovascular;  Laterality: N/A;  . BI-VENTRICULAR IMPLANTABLE CARDIOVERTER DEFIBRILLATOR  (CRT-D)  07/28/2012  . BOWEL RESECTION N/A 08/21/2018   Procedure: Small Bowel Resection;  Surgeon: Jovita Kussmaul, MD;  Location: [place];  Service: General;  Laterality: N/A;  . BREAST BIOPSY Bilateral   . CATARACT EXTRACTION W/  INTRAOCULAR LENS IMPLANT Left   . CATARACT EXTRACTION W/PHACO Right 09/15/2013   Procedure: CATARACT EXTRACTION PHACO AND INTRAOCULAR LENS PLACEMENT (IOC);  Surgeon: Elta Guadeloupe T. Gershon Crane, MD;  Location: [facility] ORS;  Service: Ophthalmology;  Laterality: Right;  CDE:  10.74  . COLONOSCOPY N/A 03/04/2015   Procedure: COLONOSCOPY;  Surgeon: Rogene Houston, MD;  Location: [facility] ENDO SUITE;  Service: Endoscopy;  Laterality: N/A;  10:50   . ESOPHAGOGASTRODUODENOSCOPY N/A 03/04/2015   Procedure: ESOPHAGOGASTRODUODENOSCOPY (EGD);  Surgeon: Rogene Houston, MD;  Location: [facility];  Service: Endoscopy;  Laterality: N/A;  . ESOPHAGOGASTRODUODENOSCOPY N/A 09/21/2016   Procedure: ESOPHAGOGASTRODUODENOSCOPY (EGD);  Surgeon: Rogene Houston, MD;  [facility];  Service: Endoscopy;  Laterality: N/A;  . GIVENS CAPSULE STUDY N/A 09/22/2016   Procedure: GIVENS CAPSULE STUDY;  Surgeon: Rogene Houston, MD;  Location: [facility];  Service: Endoscopy;  Laterality: N/A;  . LAPAROTOMY N/A 08/21/2018   Procedure: EXPLORATORY LAPAROTOMY WITH LYSIS OF ADHESIONS;  Surgeon: Jovita Kussmaul, MD;  Location: [place];  Service: General;  Laterality: N/A;  . MASTECTOMY Left 1998  . TENDON TRANSFER Right 08/15/2017   Procedure: TENDON TRANSFER RIGHT WRIST EXTENSORS AS NEEDED, ULNA RESECTION AND STABILIZATION;  Surgeon: Charlotte Crumb, MD;  Location: [place];  Service: Orthopedics;  Laterality: Right;  . TOTAL KNEE ARTHROPLASTY Right    Dr.Harrison  . TUBAL LIGATION     HPI:  Patient is a 78 y.o. female with PMH: chronic CHF, CAD with remote MI, HTN, hyperlipidemia, CKD stage III, chronic anemia, GERD, who underwent ex-lap for SBO  on 4/30. She was transferred to ICU post-op for severe hypotension and shock, was intubated from 4/30 to 5/3.   Assessment / Plan / Recommendation Clinical Impression  Patient presents with a mild oropharyngeal dysphagia characterized by delayed swallow initiation, multiple swallows and mild delayed  throat clearing after PO intake of thin liquids (water). Presence of NG tube likely contributed to patient's difficulty with initial swallows of liquids. Patient's vocal quality returned to baseline after minimal frequency of throat clearing with thin liquids.   Only thin liquids were assessed at this time secondary to GI restrictions related to SBO SLP Visit Diagnosis: Dysphagia, unspecified (R13.10)    Aspiration Risk  Mild aspiration risk;Moderate aspiration risk    Diet Recommendation Thin liquid   Liquid Administration via: Cup;Straw Medication Administration: Via alternative means Supervision: Full supervision/cueing for compensatory strategies;Staff to assist with self feeding Compensations: Minimize environmental distractions;Slow rate;Small sips/bites Postural Changes: Seated upright at 90 degrees    Other  Recommendations Oral Care Recommendations: Oral care BID   Follow up Recommendations Other (comment)(pending progress)      Frequency and Duration min 2x/week  1 week       Prognosis Prognosis for Safe Diet Advancement: Good      Swallow Study   General Date of Onset: 08/21/18 HPI: Patient is a 78 y.o. female with PMH: chronic CHF, CAD with remote MI, HTN, hyperlipidemia, CKD stage III, chronic anemia, GERD, who underwent ex-lap for SBO on 4/30. She was transferred to ICU post-op for severe hypotension and shock, was intubated from 4/30 to 5/3. Type of Study: Bedside Swallow Evaluation Previous Swallow Assessment: N/A Diet Prior to this Study: Thin liquids Temperature Spikes Noted: No Respiratory Status: Nasal cannula History of Recent Intubation: Yes Length of Intubations (days): 4 days Date extubated: 08/24/18 Behavior/Cognition: Alert;Cooperative;Pleasant mood Oral Cavity Assessment: Within Functional Limits Oral Care Completed by SLP: Yes Oral Cavity - Dentition: Edentulous Self-Feeding Abilities: Total assist Patient Positioning: Upright in bed Baseline  Vocal Quality: Low vocal intensity Volitional Cough: Cognitively unable to elicit Volitional Swallow: Able to elicit    Oral/Motor/Sensory Function Overall Oral Motor/Sensory Function: Within functional limits   Ice Chips Ice chips: Not tested   Thin Liquid Thin Liquid: Impaired Presentation: Cup;Straw Pharyngeal  Phase Impairments: Suspected delayed Swallow;Multiple swallows;Throat Clearing - Delayed    Nectar Thick Nectar Thick Liquid: Not tested   Honey Thick Honey Thick Liquid: Not tested   Puree Puree: Not tested   Solid     Solid: Not tested      [name] 08/26/2018,4:36 PM   [name], MA, CCC-SLP Speech Therapy [facility] Acute Rehab Pager: [phone]

## 2018-08-26 NOTE — Progress Notes (Signed)
PROGRESS NOTE    [name]  GUR:[id number] DOB: 1941-04-19 DOA: 08/13/2018 PCP: [name], MD  Brief Narrative: PCCM Pick up 5/5 Is a chronically ill 78 year old female with history of chronic systolic CHF, nonsustained V. tach, pericardial effusion, CAD, AICD, chronic diastolic CHF, chronic kidney disease stage III, hypertension, rheumatoid arthritis, anemia of chronic disease was admitted to any [facility] on 4/22 with small bowel obstruction, also started on antibiotics for possible pneumonia -Failed conservative management developed high-grade small bowel obstruction by 4/28 -4/29 transferred to [facility] -4/30 went to the OR underwent ex lap with lysis of adhesion, bowel resection for ischemic bowel, returned to the ICU on ventilator, hypotensive -4/30 shocked for VT, started on pressors -5/1: Developed progressive renal failure oliguria -5/2: Off pressors -5/3: Extubated General surgery following, currently on TNA, awaiting return of bowel function 5/5: Transferred to [place]:   Septic shock -Resolved -Status post VDRF and pressor support, extubated 5/3, weaned off pressors 5/2  Small bowel obstruction, ischemic colon -Conservative management of bowel obstruction at any [facility] -On 4/30 underwent ex lap for SBO and small bowel resection of ischemic necrotic bowel -Currently on IV Zosyn, day 5 -On TNA awaiting return of bowel function and NG tube for decompression -Per general surgery  Acute kidney injury on CKD stage II -Baseline creatinine around 1.2 -Creatinine worsened to 3.2, in the setting of septic shock, ATN, ischemic bowel -Slight improvement, down to 3.0 now, with severe hypokalemia and metabolic acidosis -Continue TNA, renal dosing medications -Gentle half NS with potassium for 12 hours  Chronic systolic CHF, AICD -EF of 30% with by V ICD -Off diuretics, ACE inhibitor in the setting of AKI and septic shock  -Resume Coreg tomorrow  Acute on chronic anemia -Acute on chronic, no overt bleeding -Slight worsening of hemoglobin, monitor, transfuse if continues to trend down -Check anemia panel  Paroxysmal atrial fibrillation -Status post DC cardioversion on 4/29, currently in sinus rhythm -On IV amiodarone, cardiology following  Severe protein calorie malnutrition -Starting liquid diet, add supplements as tolerated pending return of bowel function  History of CAD -Stable, restart beta-blockers in 1 to 2 days  DVT prophylaxis: Heparin subcutaneous Code Status: Full code Family Communication: No family at bedside Disposition Plan: Will likely need rehab  Consultants:   Cardiology  PCCM  Surgery     Procedures:   -DC cardioversion 4/29  -4/30 :Ex lap with lysis of adhesion, bowel resection for ischemic bowel,  Antimicrobials:    Subjective: -Feels weak, per nurse, small BM this morning, denies any chest pain or dyspnea Objective: Vitals:   08/25/18 1958 08/25/18 2300 08/26/18 0257 08/26/18 0859  BP: (!) 131/55 (!) 124/59 (!) 141/65 (!) 127/53  Pulse: 89 87 89 89  Resp: (!) 21 (!) 25 17 (!) 34  Temp: 97.6 F (36.4 C) 98 F (36.7 C) 97.8 F (36.6 C) 98 F (36.7 C)  TempSrc: Oral Oral Oral Axillary  SpO2: 95% 95% 98% 94%  Weight:      Height:        Intake/Output Summary (Last 24 hours) at 08/26/2018 1402 Last data filed at 08/26/2018 1016 Gross per 24 hour  Intake 218.18 ml  Output 2290 ml  Net -2071.82 ml   Filed Weights   08/14/18 0500 08/22/18 0625 08/24/18 0500  Weight: 64.8 kg 70.3 kg 72.8 kg    Examination:  General exam: Early, frail chronically ill female, very hard of hearing, with NG tube Respiratory  system: Decreased breath sounds at both bases Cardiovascular system: S1 & S2 heard, RRR.   Gastrointestinal system: Abdomen is soft, nondistended, midline surgical incision with wound VAC and JP drain in the right, bowel sounds diminished but present   Central nervous system: Alert and oriented to self and partly to place Extremities: No edema, right IJ PICC line Skin: No rashes, lesions or ulcers Psychiatry:  Mood & affect appropriate.     Data Reviewed:   CBC: Recent Labs  Lab 08/22/18 0401 08/23/18 0354 08/24/18 0444 08/25/18 0415 08/26/18 0500  WBC 47.6* 31.8* 25.4* 13.8* 10.2  NEUTROABS  --   --   --   --  8.5*  HGB 11.6* 9.0* 8.2* 7.8* 8.3*  HCT 36.3 28.0* 25.1* 23.5* 24.1*  MCV 85.6 85.4 82.8 80.5 77.0*  PLT 148* 116* 102* 100* 92*   Basic Metabolic Panel: Recent Labs  Lab 08/20/18 1248  08/22/18 0401 08/23/18 0354 08/24/18 0444 08/25/18 0415 08/26/18 0500  NA 146*   < > 148* 148* 147* 148* 147*  K 4.1   < > 5.0 4.7 4.4 3.7 2.6*  CL 115*   < > 119* 119* 119* 120* 115*  CO2 17*   < > 16* 17* 16* 17* 19*  GLUCOSE 74   < > 111* 124* 93 65* 131*  BUN 52*   < > 44* 57* 68* 65* 63*  CREATININE 1.18*   < > 2.02* 2.92* 3.21* 3.19* 3.02*  CALCIUM 8.5*   < > 6.9* 7.4* 7.6* 7.4* 7.6*  MG 2.6*  --   --  1.9  --   --  2.2  PHOS  --   --   --   --   --   --  3.4   < > = values in this interval not displayed.   GFR: Estimated Creatinine Clearance: 14.8 mL/min (A) (by C-G formula based on SCr of 3.02 mg/dL (H)). Liver Function Tests: Recent Labs  Lab 08/21/18 1202 08/24/18 0444 08/26/18 0500  AST 32 55* 60*  ALT 16 10 13   ALKPHOS 88 201* 226*  BILITOT 1.8* 1.8* 2.1*  PROT 5.1* 4.6* 4.9*  ALBUMIN 2.2* 1.3* 1.2*   No results for input(s): LIPASE, AMYLASE in the last 168 hours. No results for input(s): AMMONIA in the last 168 hours. Coagulation Profile: No results for input(s): INR, PROTIME in the last 168 hours. Cardiac Enzymes: Recent Labs  Lab 08/21/18 1202 08/21/18 1925 08/22/18 0401  TROPONINI 0.05* 0.08* 0.11*   BNP (last 3 results) No results for input(s): PROBNP in the last 8760 hours. HbA1C: No results for input(s): HGBA1C in the last 72 hours. CBG: Recent Labs  Lab 08/25/18 1957  08/25/18 2345 08/26/18 0254 08/26/18 0904 08/26/18 1153  GLUCAP 116* 125* 114* 117* 121*   Lipid Profile: Recent Labs    08/26/18 0500  TRIG 78   Thyroid Function Tests: No results for input(s): TSH, T4TOTAL, FREET4, T3FREE, THYROIDAB in the last 72 hours. Anemia Panel: No results for input(s): VITAMINB12, FOLATE, FERRITIN, TIBC, IRON, RETICCTPCT in the last 72 hours. Urine analysis:    Component Value Date/Time   COLORURINE AMBER (A) 08/13/2018 1849   APPEARANCEUR CLOUDY (A) 08/13/2018 1849   LABSPEC 1.016 08/13/2018 1849   PHURINE 5.0 08/13/2018 1849   GLUCOSEU NEGATIVE 08/13/2018 1849   HGBUR MODERATE (A) 08/13/2018 1849   BILIRUBINUR NEGATIVE 08/13/2018 1849   KETONESUR NEGATIVE 08/13/2018 1849   PROTEINUR 30 (A) 08/13/2018 1849   UROBILINOGEN 0.2 07/17/2013 2240  NITRITE NEGATIVE 08/13/2018 1849   LEUKOCYTESUR TRACE (A) 08/13/2018 1849   Sepsis Labs: @LABRCNTIP (procalcitonin:4,lacticidven:4)  ) Recent Results (from the past 240 hour(s))  Surgical pcr screen     Status: Abnormal   Collection Time: 08/20/18 11:50 PM  Result Value Ref Range Status   MRSA, PCR POSITIVE (A) NEGATIVE Final    Comment: RESULT CALLED TO, READ BACK BY AND VERIFIED WITH: I BATOON RN 08/21/18 0110 JDW    Staphylococcus aureus POSITIVE (A) NEGATIVE Final    Comment: (NOTE) The Xpert SA Assay (FDA approved for NASAL specimens in patients 80 years of age and older), is one component of a comprehensive surveillance program. It is not intended to diagnose infection nor to guide or monitor treatment. Performed at [facility], [place] [address], [place], [place] [address]          Radiology Studies: Dg Chest Port 1 View  Result Date: 08/25/2018 CLINICAL DATA:  Acute respiratory failure with hypoxia. EXAM: PORTABLE CHEST 1 VIEW COMPARISON:  Chest x-ray from Aug 23, 2018. FINDINGS: Interval removal of the endotracheal tube. Unchanged enteric tube and right internal jugular central  venous catheter. Unchanged left chest wall pacemaker. Stable cardiomegaly. New mild pulmonary vascular congestion. Unchanged bibasilar atelectasis and small bilateral pleural effusions. No pneumothorax. No acute osseous abnormality. IMPRESSION: 1. New mild pulmonary vascular congestion. 2. Unchanged bibasilar atelectasis small bilateral pleural effusions. Electronically Signed   By: Titus Dubin M.D.   On: 08/25/2018 07:11        Scheduled Meds: . chlorhexidine  15 mL Mouth Rinse BID  . heparin injection (subcutaneous)  5,000 Units Subcutaneous Q8H  . insulin aspart  0-9 Units Subcutaneous Q4H  . mouth rinse  15 mL Mouth Rinse q12n4p  . mupirocin ointment   Nasal BID  . pantoprazole (PROTONIX) IV  40 mg Intravenous QHS   Continuous Infusions: . amiodarone 30 mg/hr (08/26/18 1147)  . dextrose 30 mL/hr at 08/26/18 0634  . piperacillin-tazobactam (ZOSYN)  IV 2.25 g (08/26/18 0853)  . TPN ADULT (ION) 30 mL/hr at 08/25/18 1818  . TPN ADULT (ION)       LOS: 13 days    Time spent: 41min    Domenic Polite, MD [facility]   08/26/2018, 2:02 PM

## 2018-08-26 NOTE — Progress Notes (Signed)
08/26/2018 8:00 AM Reported to me by Charge Nurse from nights a critical potasium of 2.5.  MD aware and orders have been written for IV K replacement. [place]

## 2018-08-26 NOTE — Care Management Important Message (Signed)
Important Message  Patient Details  Name: [name] MRN: [id number] Date of Birth: January 10, 1941   Medicare Important Message Given:  Yes    Orbie Pyo 08/26/2018, 2:04 PM

## 2018-08-26 NOTE — Progress Notes (Signed)
08/26/2018 1:09 PM Pt reports no nausea or vomiting since tube has been clamped.  Will continue to monitor. [place]

## 2018-08-26 NOTE — Progress Notes (Signed)
5 Days Post-Op  Subjective: CC: Abdominal pain Patient out of the unit. Did well overnight per nursing staff. 2BMs yesterday. Unsure of consistency. Minimal output from NG. Urine output continues to increase. Patient reports no nausea and reports passing flatus. Wound vac placed yesterday.   Objective: Vital signs in last 24 hours: Temp:  [97.4 F (36.3 C)-98 F (36.7 C)] 98 F (36.7 C) (05/05 0859) Pulse Rate:  [87-89] 89 (05/05 0859) Resp:  [17-34] 34 (05/05 0859) BP: (108-143)/(45-79) 127/53 (05/05 0859) SpO2:  [94 %-98 %] 94 % (05/05 0859) Last BM Date: 08/22/18  Intake/Output from previous day: 05/04 0701 - 05/05 0700 In: 444.8 [I.V.:394.9; IV Piggyback:49.9] Out: 2275 [Urine:2225; Emesis/NG output:5; Drains:45] Intake/Output this shift: Total I/O In: -  Out: 800 [Urine:800]  PE: Gen: Awake and alert, NAD.  Heart: Regular rate, 90's while in the room Lungs: Normal effort. Off O2. CTA b/l Abd: Soft, mild to no distension, normoactive BS, appropriately tender without peritonitis. Midline wound with wound vac in place.  JP drain in place. 45cc output overnight, bloody serosanguinous fluid. NG tube with minimal output overnight. Appears to be functioning properly.  Msk: Trace edema   Lab Results:  Recent Labs    08/25/18 0415 08/26/18 0500  WBC 13.8* 10.2  HGB 7.8* 8.3*  HCT 23.5* 24.1*  PLT 100* 92*   BMET Recent Labs    08/25/18 0415 08/26/18 0500  NA 148* 147*  K 3.7 2.6*  CL 120* 115*  CO2 17* 19*  GLUCOSE 65* 131*  BUN 65* 63*  CREATININE 3.19* 3.02*  CALCIUM 7.4* 7.6*   PT/INR No results for input(s): LABPROT, INR in the last 72 hours. CMP     Component Value Date/Time   NA 147 (H) 08/26/2018 0500   K 2.6 (LL) 08/26/2018 0500   CL 115 (H) 08/26/2018 0500   CO2 19 (L) 08/26/2018 0500   GLUCOSE 131 (H) 08/26/2018 0500   BUN 63 (H) 08/26/2018 0500   CREATININE 3.02 (H) 08/26/2018 0500   CREATININE 1.07 (H) 02/13/2018 1142   CALCIUM 7.6  (L) 08/26/2018 0500   PROT 4.9 (L) 08/26/2018 0500   ALBUMIN 1.2 (L) 08/26/2018 0500   AST 60 (H) 08/26/2018 0500   ALT 13 08/26/2018 0500   ALKPHOS 226 (H) 08/26/2018 0500   BILITOT 2.1 (H) 08/26/2018 0500   GFRNONAA 14 (L) 08/26/2018 0500   GFRNONAA 49 (L) 02/13/2018 1142   GFRAA 16 (L) 08/26/2018 0500   GFRAA 57 (L) 02/13/2018 1142   Lipase     Component Value Date/Time   LIPASE 30 08/13/2018 1926       Studies/Results: Dg Chest Port 1 View  Result Date: 08/25/2018 CLINICAL DATA:  Acute respiratory failure with hypoxia. EXAM: PORTABLE CHEST 1 VIEW COMPARISON:  Chest x-ray from Aug 23, 2018. FINDINGS: Interval removal of the endotracheal tube. Unchanged enteric tube and right internal jugular central venous catheter. Unchanged left chest wall pacemaker. Stable cardiomegaly. New mild pulmonary vascular congestion. Unchanged bibasilar atelectasis and small bilateral pleural effusions. No pneumothorax. No acute osseous abnormality. IMPRESSION: 1. New mild pulmonary vascular congestion. 2. Unchanged bibasilar atelectasis small bilateral pleural effusions. Electronically Signed   By: Titus Dubin M.D.   On: 08/25/2018 07:11    Anti-infectives: Anti-infectives (From admission, onward)   Start     Dose/Rate Route Frequency Ordered Stop   08/23/18 1400  piperacillin-tazobactam (ZOSYN) IVPB 2.25 g     2.25 g 100 mL/hr over 30 Minutes Intravenous Every 8  hours 08/23/18 0928     08/21/18 1200  piperacillin-tazobactam (ZOSYN) IVPB 3.375 g  Status:  Discontinued     3.375 g 12.5 mL/hr over 240 Minutes Intravenous Every 8 hours 08/21/18 1102 08/23/18 0928   08/14/18 1000  hydroxychloroquine (PLAQUENIL) tablet 200 mg  Status:  Discontinued    Note to Pharmacy:  Take 1 tablet by mouth daily Monday through Friday only     200 mg Oral Once per day on Mon Tue Wed Thu Fri 08/14/18 0742 08/21/18 1147   08/13/18 2200  doxycycline (VIBRA-TABS) tablet 100 mg  Status:  Discontinued     100 mg Oral  Every 12 hours 08/13/18 2134 08/13/18 2135   08/13/18 2200  doxycycline (VIBRAMYCIN) 100 mg in sodium chloride 0.9 % 250 mL IVPB  Status:  Discontinued     100 mg 125 mL/hr over 120 Minutes Intravenous Every 12 hours 08/13/18 2135 08/19/18 1702   08/13/18 2145  cefTRIAXone (ROCEPHIN) 1 g in sodium chloride 0.9 % 100 mL IVPB  Status:  Discontinued     1 g 200 mL/hr over 30 Minutes Intravenous Every 24 hours 08/13/18 2134 08/14/18 0743       Assessment/Plan POD5, s/p ex lap w/ SBR for SBO & ischemic/necroticbowel, 4/30 - PT -Extubated. Offpressors. Out of unit. Appreciate TRH help in her care -TPN. Pre-alb <5. Clamp NG tube and start on CLD.   - LFTs up-trending slightly. ? From TPN. Monitor.  -Continue wound vac -cont JP drain for now -cont zosyn -Path benign. 58cm SB w/ hemorrhage and prominent necrosis w/ viable margins.  - Mobilize and IS   Acute on chronic renal failure - UOP increased (2.2 L/24 hours). Cr continues to slightly improved at 3.02 from 3.19 - cont fluids, but has a history of CHF as well. Defer fluid balance to [facility]  ICD/CAD/CHF -per cardiology.  -on amio  Acute Respiratory Failure - Off vent. Improving. Per CCM  Anemia  - Hgb improved from 7.8 to 8.3. VSS  FEN -TPN, CLD, clamp NG tube, K 2.6 (being replaced), Mg 2.2 VTE -Lovenox/SCD ID -zosyn 4/30 >> WBC downtrending 47.6>31.8>25.4>13.8>10.2; afebrile   LOS: 13 days    Jillyn Ledger , [facility] 08/26/2018, 10:36 AM Pager: [phone]

## 2018-08-27 LAB — BASIC METABOLIC PANEL
Anion gap: 10 (ref 5–15)
BUN: 64 mg/dL — ABNORMAL HIGH (ref 8–23)
CO2: 18 mmol/L — ABNORMAL LOW (ref 22–32)
Calcium: 8 mg/dL — ABNORMAL LOW (ref 8.9–10.3)
Chloride: 112 mmol/L — ABNORMAL HIGH (ref 98–111)
Creatinine, Ser: 2.46 mg/dL — ABNORMAL HIGH (ref 0.44–1.00)
GFR calc Af Amer: 21 mL/min — ABNORMAL LOW (ref >60)
GFR calc non Af Amer: 18 mL/min — ABNORMAL LOW (ref >60)
Glucose, Bld: 151 mg/dL — ABNORMAL HIGH (ref 70–99)
Potassium: 3.5 mmol/L (ref 3.5–5.1)
Sodium: 140 mmol/L (ref 135–145)

## 2018-08-27 LAB — COMPREHENSIVE METABOLIC PANEL
ALT: 15 U/L (ref 0–44)
AST: 54 U/L — ABNORMAL HIGH (ref 15–41)
Albumin: 1.1 g/dL — ABNORMAL LOW (ref 3.5–5.0)
Alkaline Phosphatase: 220 U/L — ABNORMAL HIGH (ref 38–126)
Anion gap: 14 (ref 5–15)
BUN: 61 mg/dL — ABNORMAL HIGH (ref 8–23)
CO2: 17 mmol/L — ABNORMAL LOW (ref 22–32)
Calcium: 7.8 mg/dL — ABNORMAL LOW (ref 8.9–10.3)
Chloride: 112 mmol/L — ABNORMAL HIGH (ref 98–111)
Creatinine, Ser: 2.56 mg/dL — ABNORMAL HIGH (ref 0.44–1.00)
GFR calc Af Amer: 20 mL/min — ABNORMAL LOW (ref >60)
GFR calc non Af Amer: 17 mL/min — ABNORMAL LOW (ref >60)
Glucose, Bld: 135 mg/dL — ABNORMAL HIGH (ref 70–99)
Potassium: 2.8 mmol/L — ABNORMAL LOW (ref 3.5–5.1)
Sodium: 143 mmol/L (ref 135–145)
Total Bilirubin: 2 mg/dL — ABNORMAL HIGH (ref 0.3–1.2)
Total Protein: 4.6 g/dL — ABNORMAL LOW (ref 6.5–8.1)

## 2018-08-27 LAB — MAGNESIUM: Magnesium: 1.7 mg/dL (ref 1.7–2.4)

## 2018-08-27 LAB — PHOSPHORUS: Phosphorus: 2.2 mg/dL — ABNORMAL LOW (ref 2.5–4.6)

## 2018-08-27 LAB — GLUCOSE, CAPILLARY
Glucose-Capillary: 113 mg/dL — ABNORMAL HIGH (ref 70–99)
Glucose-Capillary: 115 mg/dL — ABNORMAL HIGH (ref 70–99)
Glucose-Capillary: 126 mg/dL — ABNORMAL HIGH (ref 70–99)
Glucose-Capillary: 128 mg/dL — ABNORMAL HIGH (ref 70–99)
Glucose-Capillary: 174 mg/dL — ABNORMAL HIGH (ref 70–99)
Glucose-Capillary: 178 mg/dL — ABNORMAL HIGH (ref 70–99)

## 2018-08-27 MED ORDER — POTASSIUM CHLORIDE CRYS ER 20 MEQ PO TBCR: 40.0000 meq | EXTENDED_RELEASE_TABLET | Freq: Two times a day (BID) | ORAL | Status: DC

## 2018-08-27 MED ORDER — POTASSIUM CHLORIDE 10 MEQ/50ML IV SOLN
10.0000 meq | INTRAVENOUS | Status: AC
  Administered 2018-08-27 – 2018-08-28 (×4): 10 meq via INTRAVENOUS
  Filled 2018-08-27 (×4): qty 50

## 2018-08-27 MED ORDER — POTASSIUM CHLORIDE 10 MEQ/50ML IV SOLN
10.0000 meq | INTRAVENOUS | Status: DC
  Filled 2018-08-27 (×4): qty 50

## 2018-08-27 MED ORDER — POTASSIUM PHOSPHATES 15 MMOLE/5ML IV SOLN
15.0000 mmol | Freq: Once | INTRAVENOUS | Status: AC
  Administered 2018-08-27: 17:00:00 15 mmol via INTRAVENOUS
  Filled 2018-08-27: qty 5

## 2018-08-27 MED ORDER — OXYCODONE HCL 5 MG PO TABS
5.0000 mg | ORAL_TABLET | ORAL | Status: DC | PRN
  Administered 2018-08-29 (×2): 10 mg via ORAL
  Administered 2018-08-31 – 2018-09-04 (×5): 5 mg via ORAL
  Administered 2018-09-05 – 2018-09-15 (×18): 10 mg via ORAL
  Filled 2018-08-27: qty 2
  Filled 2018-08-27: qty 1
  Filled 2018-08-27 (×5): qty 2
  Filled 2018-08-27: qty 1
  Filled 2018-08-27 (×6): qty 2
  Filled 2018-08-27: qty 1
  Filled 2018-08-27 (×8): qty 2
  Filled 2018-08-27 (×2): qty 1

## 2018-08-27 MED ORDER — TRAVASOL 10 % IV SOLN
INTRAVENOUS | Status: AC
  Administered 2018-08-27: 18:00:00 via INTRAVENOUS
  Filled 2018-08-27: qty 825

## 2018-08-27 MED ORDER — MAGNESIUM SULFATE 2 GM/50ML IV SOLN: 2.0000 g | Freq: Once | INTRAVENOUS | Status: DC

## 2018-08-27 MED ORDER — POTASSIUM CHLORIDE 10 MEQ/100ML IV SOLN: 10.0000 meq | INTRAVENOUS | Status: DC

## 2018-08-27 MED ORDER — MAGNESIUM SULFATE IN D5W 1-5 GM/100ML-% IV SOLN
1.0000 g | Freq: Once | INTRAVENOUS | Status: AC
  Administered 2018-08-27: 1 g via INTRAVENOUS
  Filled 2018-08-27: qty 100

## 2018-08-27 MED ORDER — POTASSIUM CHLORIDE 10 MEQ/50ML IV SOLN
10.0000 meq | INTRAVENOUS | Status: AC
  Administered 2018-08-27 (×5): 10 meq via INTRAVENOUS
  Filled 2018-08-27 (×5): qty 50

## 2018-08-27 NOTE — Progress Notes (Signed)
Spoke to IV RN about placement for PIV. Pt with potassium phosphate to give that is not compatable with other drips and no available lines. IV RN said she could not come place a PIV because pt is too difficult to stick and to give potassium phosphate after the 5 bags of potassium chloride.   Clyde Canterbury, RN

## 2018-08-27 NOTE — Progress Notes (Signed)
Progress Note  Patient Name: [name] Date of Encounter: 08/27/2018  Primary Cardiologist: Kate Sable, MD   Subjective   78 year old female with a history of chronic systolic congestive heart failure, nonsustained ventricular tachycardia, pericardial effusion, coronary artery disease, AICD, chronic kidney disease stage III who was admitted to [facility] with a small bowel obstruction on April 22.  She was started on antibiotics for possible pneumonia.  She underwent a exploratory laparoscopy on April 30 with lysis of adhesions.  She had a bowel resection for ischemic bowel.  She has had progressive renal failure since that time and has had oliguria.  She was extubated on May 3.  She is currently on TNA..  Echocardiogram on August 19, 2018 reveals moderate left ventricular dysfunction.  Her ejection fraction is approximately 30%.  Very hard of hearing   Inpatient Medications    Scheduled Meds: . chlorhexidine  15 mL Mouth Rinse BID  . heparin injection (subcutaneous)  5,000 Units Subcutaneous Q8H  . insulin aspart  0-9 Units Subcutaneous Q4H  . mouth rinse  15 mL Mouth Rinse q12n4p  . mupirocin ointment   Nasal BID  . pantoprazole (PROTONIX) IV  40 mg Intravenous QHS   Continuous Infusions: . amiodarone 30 mg/hr (08/26/18 2354)  . piperacillin-tazobactam (ZOSYN)  IV 2.25 g (08/27/18 0500)  . potassium chloride 10 mEq (08/27/18 1002)  . potassium PHOSPHATE IVPB (in mmol)    . TPN ADULT (ION) 55 mL/hr at 08/26/18 1737  . TPN ADULT (ION)     PRN Meds: acetaminophen **OR** acetaminophen, ondansetron **OR** ondansetron (ZOFRAN) IV, oxyCODONE   Vital Signs    Vitals:   08/26/18 2034 08/26/18 2358 08/27/18 0354 08/27/18 0954  BP: (!) 138/59 (!) 129/59 (!) 127/54 (!) 124/55  Pulse: 89 88 90 88  Resp: 20 (!) 21 (!) 22 (!) 28  Temp: 98.6 F (37 C) 98.3 F (36.8 C) 98.8 F (37.1 C) (!) 97.4 F (36.3 C)  TempSrc: Oral Oral Oral Oral  SpO2: 94% 96% 95% 96%   Weight:      Height:        Intake/Output Summary (Last 24 hours) at 08/27/2018 1139 Last data filed at 08/27/2018 1009 Gross per 24 hour  Intake 240 ml  Output 971 ml  Net -731 ml   Last 3 Weights 08/24/2018 08/22/2018 08/14/2018  Weight (lbs) 160 lb 7.9 oz 154 lb 15.7 oz 142 lb 13.7 oz  Weight (kg) 72.8 kg 70.3 kg 64.8 kg      Telemetry    AV paced  - Personally Reviewed  ECG     AV paced  - Personally Reviewed  Physical Exam   GEN: elderly frail female,  NAD , on multiple tripdds  Neck: No JVD Cardiac: RRR, no murmurs, rubs, or gallops.  Respiratory: Clear to auscultation bilaterally. GI: Soft, nontender, non-distended  MS: No edema; No deformity. Neuro:  hard of hearing   Psych: difficult to assess   Labs    Chemistry Recent Labs  Lab 08/24/18 0444 08/25/18 0415 08/26/18 0500 08/27/18 0513  NA 147* 148* 147* 143  K 4.4 3.7 2.6* 2.8*  CL 119* 120* 115* 112*  CO2 16* 17* 19* 17*  GLUCOSE 93 65* 131* 135*  BUN 68* 65* 63* 61*  CREATININE 3.21* 3.19* 3.02* 2.56*  CALCIUM 7.6* 7.4* 7.6* 7.8*  PROT 4.6*  --  4.9* 4.6*  ALBUMIN 1.3*  --  1.2* 1.1*  AST 55*  --  60* 54*  ALT 10  --  13 15  ALKPHOS 201*  --  226* 220*  BILITOT 1.8*  --  2.1* 2.0*  GFRNONAA 13* 13* 14* 17*  GFRAA 15* 15* 16* 20*  ANIONGAP 12 11 13 14      Hematology Recent Labs  Lab 08/24/18 0444 08/25/18 0415 08/26/18 0500  WBC 25.4* 13.8* 10.2  RBC 3.03* 2.92* 3.13*  HGB 8.2* 7.8* 8.3*  HCT 25.1* 23.5* 24.1*  MCV 82.8 80.5 77.0*  MCH 27.1 26.7 26.5  MCHC 32.7 33.2 34.4  RDW 20.0* 19.9* 19.6*  PLT 102* 100* 92*    Cardiac Enzymes Recent Labs  Lab 08/21/18 1202 08/21/18 1925 08/22/18 0401  TROPONINI 0.05* 0.08* 0.11*   No results for input(s): TROPIPOC in the last 168 hours.   BNPNo results for input(s): BNP, PROBNP in the last 168 hours.   DDimer No results for input(s): DDIMER in the last 168 hours.   Radiology    No results found.  Cardiac Studies     Patient  Profile     78 y.o. female  With CHF, recent abdominal surgery for SBO   Assessment & Plan     1.  Chronic systolic congestive heart failure: Continue support.  We are waiting for her to start taking medications and food by mouth and we will continue to  Titrate up her medications.  2.  Paroxysmal atrial fibrillation: She is currently on IV amiodarone.  She is AV pacing.  We will change her to oral amiodarone when she is taken p.o.  3.  AV pacing: Her pacer rate has been increased to 90.  We will anticipate decreasing her pacing rate back down to her previous weight which was probably around 70         For questions or updates, please contact [place] Please consult [email or web address] for contact info under        Signed, Mertie Moores, MD  08/27/2018, 11:39 AM

## 2018-08-27 NOTE — Consult Note (Signed)
[place] Nurse wound consult note Patient receiving care in [place]. Reason for Consult: NPWT VAC dressing change Wound type: surgical Wound bed: slick, without noticeable granulation tissue Drainage (amount, consistency, odor) serous in tubing Periwound: intact Dressing procedure/placement/frequency: one piece of black foam removed, one piece of black foam placed into the wound. Immediate seal obtained.  Val Riles, RN, MSN, CWOCN, CNS-BC, pager [phone]

## 2018-08-27 NOTE — Progress Notes (Signed)
Physical Therapy Treatment Patient Details Name: [name] MRN: [id number] DOB: 22-Apr-1941 Today's Date: 08/27/2018    History of Present Illness 78 year old female with significant cardiac history that is post exploratory laparotomy for small bowel obstruction with associated ischemic bowel on 4/30, return to the intensive care on mechanical ventilator, critical care asked to assist with care.  VDRF 4/30-5/3.  Also PNA.  OEH:[id number] heart failure with EF 30%, nonsustained VT, pericardial effusion CAD w/ prior AICD, diastolic heart failure, CKD stage III, hypertension, rheumatoid arthritis, anemia of chronic disease.    PT Comments    Pt agrees to mobilize with PT upon PT arrival to room. Pt required max assist +1-2 for all mobility tasks this session, and was very fatigued with transfer to recliner seen in uncontrolled descent into recliner with lack of safe hand placement. Pt tolerated limited LE exercises well, VSS throughout session. PT to continue to follow acutely, and will progress mobility as able.    Follow Up Recommendations  SNF;Supervision/Assistance - 24 hour     Equipment Recommendations  Other (comment)(TBA)    Recommendations for Other Services       Precautions / Restrictions Precautions Precautions: Fall Precaution Comments: JP drain Restrictions Weight Bearing Restrictions: No    Mobility  Bed Mobility Overal bed mobility: Needs Assistance Bed Mobility: Rolling;Sidelying to Sit Rolling: Max assist Sidelying to sit: Max assist       General bed mobility comments: Max assist for rolling and supine to sit for completion of roll, trunk elevation, LE translation off EOB, steadying at EOB. Pt with poor sitting balance initially, progressing to needing min assist for maintaining upright vs leaning posteriorly.  Transfers Overall transfer level: Needs assistance Equipment used: Rolling walker (2 wheeled);2 person hand held assist Transfers: Sit to/from  [facility] Sit to Stand: Mod assist;+2 physical assistance;From elevated surface Stand pivot transfers: +2 physical assistance;From elevated surface;Max assist       General transfer comment: Mod assist +2 for power up, trunk elevation, and steadying upon standing. Pt with unsafe hand placement with rising, corrected by PT and RN who was assisting with transfer. Pt required max assist +2 for transfer to recliner to bedside for steadying, guiding RW, guiding pt trunk into recliner. Pt with uncontrolled descent into chair, pt reporting fatigue.   Ambulation/Gait Ambulation/Gait assistance: (NT, pt very fatigued post-transfer)               Stairs             Wheelchair Mobility    Modified Rankin (Stroke Patients Only)       Balance Overall balance assessment: Needs assistance Sitting-balance support: Feet supported;Bilateral upper extremity supported Sitting balance-Leahy Scale: Poor Sitting balance - Comments: Pt requiring min-max assist to maintain upright sitting, pt with tendency for posterior leaning.  Postural control: Posterior lean Standing balance support: Bilateral upper extremity supported;During functional activity Standing balance-Leahy Scale: Poor Standing balance comment: reliant on RW and Assist +2 at trunk and UEs                             Cognition Arousal/Alertness: Awake/alert Behavior During Therapy: WFL for tasks assessed/performed Overall Cognitive Status: Impaired/Different from baseline Area of Impairment: Orientation;Memory;Following commands;Safety/judgement;Problem solving                 Orientation Level: (Pt A&O x4 this session)   Memory: Decreased short-term memory Following Commands: Follows one step commands with increased  time Safety/Judgement: Decreased awareness of safety;Decreased awareness of deficits   Problem Solving: Slow processing;Decreased initiation;Difficulty sequencing;Requires  verbal cues;Requires tactile cues General Comments: Pt more emotive today, laughing multiple times during PT and with diversity of responses      Exercises General Exercises - Lower Extremity Ankle Circles/Pumps: AROM;Both;10 reps;Seated Quad Sets: AROM;Both;10 reps;Seated Heel Slides: AROM;Both;10 reps;Seated    General Comments General comments (skin integrity, edema, etc.): VSS during session, pt on RA      Pertinent Vitals/Pain Pain Assessment: Faces Faces Pain Scale: Hurts even more Pain Location: discomfort stomach Pain Descriptors / Indicators: Discomfort;Grimacing;Guarding Pain Intervention(s): Monitored during session;Repositioned;Limited activity within patient's tolerance    Home Living                      Prior Function            PT Goals (current goals can now be found in the care plan section) Acute Rehab PT Goals Patient Stated Goal: get stronger and safe to go home PT Goal Formulation: With patient Time For Goal Achievement: 09/08/18 Potential to Achieve Goals: Good Progress towards PT goals: Progressing toward goals    Frequency    Min 2X/week      PT Plan Current plan remains appropriate    Co-evaluation              AM-PAC PT "6 Clicks" Mobility   Outcome Measure  Help needed turning from your back to your side while in a flat bed without using bedrails?: Total Help needed moving from lying on your back to sitting on the side of a flat bed without using bedrails?: Total Help needed moving to and from a bed to a chair (including a wheelchair)?: Total Help needed standing up from a chair using your arms (e.g., wheelchair or bedside chair)?: A Lot Help needed to walk in hospital room?: Total Help needed climbing 3-5 steps with a railing? : Total 6 Click Score: 7    End of Session Equipment Utilized During Treatment: Gait belt Activity Tolerance: Patient limited by fatigue;Patient limited by pain Patient left: in chair;with  call bell/phone within reach;with chair alarm set;with nursing/sitter in room Nurse Communication: Mobility status;Need for lift equipment PT Visit Diagnosis: Unsteadiness on feet (R26.81);Muscle weakness (generalized) (M62.81)     Time: 3734-2876 PT Time Calculation (min) (ACUTE ONLY): 21 min  Charges:  $Therapeutic Activity: 8-22 mins                     Zilah Villaflor Conception Chancy, PT Acute Rehabilitation Services Pager [phone]  Office [phone]    Angelamarie Avakian D Elonda Husky 08/27/2018, 3:42 PM

## 2018-08-27 NOTE — Progress Notes (Signed)
  Speech Language Pathology Treatment: Dysphagia  [name] Details Name: [name] MRN: [id number] DOB: 1940/12/27 Today's Date: 08/27/2018 Time: 1340-1400 SLP Time Calculation (min) (ACUTE ONLY): 20 min  Assessment / Plan / Recommendation Clinical Impression  [name] seen to address dysphagia goals. GI has upgraded diet consistencies to full liquids, which includes applesauce, yogurt, etc. [name] stated that she had some grape juice and ice cream earlier today. She tolerated thin liquids via straw sips with only intermittent delayed cough. [name] did expectorate clear thick secretions two times during session after cough. NG tube has been removed and [name] looks better, with more alertness and swallow is improved today as compared to yesterday as per SLP observation. Voice remained clear throughout session.   HPI HPI: [name] is a 78 y.o. female with PMH: chronic CHF, CAD with remote MI, HTN, hyperlipidemia, CKD stage III, chronic anemia, GERD, who underwent ex-lap for SBO on 4/30. She was transferred to ICU post-op for severe hypotension and shock, was intubated from 4/30 to 5/3.      SLP Plan  Continue with current plan of care       Recommendations  Diet recommendations: Thin liquid;Dysphagia 1 (puree) Liquids provided via: Cup;Straw Medication Administration: Crushed with puree Supervision: Full supervision/cueing for compensatory strategies;Staff to assist with self feeding Compensations: Slow rate;Small sips/bites;Minimize environmental distractions Postural Changes and/or Swallow Maneuvers: Seated upright 90 degrees;Upright 30-60 min after meal                Oral Care Recommendations: Oral care BID Follow up Recommendations: Skilled Nursing facility;24 hour supervision/assistance SLP Visit Diagnosis: Dysphagia, unspecified (R13.10) Plan: Continue with current plan of care       GO                [name] 08/27/2018, 4:52 PM    [name], MA, [place] [facility] Pager: [phone]

## 2018-08-27 NOTE — Progress Notes (Addendum)
6 Days Post-Op  Subjective: CC: No complaints  NG tube out yesterday after tolerating CLD. Tolerated diet overnight. Denies N/V. Passing flatus and BM this AM that was soft/liquidy. Continues to have good urine output.   Objective: Vital signs in last 24 hours: Temp:  [98 F (36.7 C)-98.8 F (37.1 C)] 98.8 F (37.1 C) (05/06 0354) Pulse Rate:  [88-90] 90 (05/06 0354) Resp:  [20-34] 22 (05/06 0354) BP: (127-138)/(53-59) 127/54 (05/06 0354) SpO2:  [94 %-96 %] 95 % (05/06 0354) Last BM Date: 08/26/18  Intake/Output from previous day: 05/05 0701 - 05/06 0700 In: 100 [P.O.:65] Out: 1750 [Urine:1675; Drains:75] Intake/Output this shift: No intake/output data recorded.  PE: Gen: Awake and alert, NAD.  Heart: Regular rate Lungs: Tachypnea. Off O2. Decreased breath sounds at bases. Abd: Soft, no distension, normoactive BS, appropriately tender without peritonitis. Midline wound with wound vac in place. This was removed after discussion with [place] nurse (appears slick without any noticeable granulation on her assessment). Please see picture below. JP drain in place. 75cc output overnight, bloody serosanguinous fluid. This was stripped and recharged.  Msk: Trace edema       Lab Results:  Recent Labs    08/25/18 0415 08/26/18 0500  WBC 13.8* 10.2  HGB 7.8* 8.3*  HCT 23.5* 24.1*  PLT 100* 92*   BMET Recent Labs    08/26/18 0500 08/27/18 0513  NA 147* 143  K 2.6* 2.8*  CL 115* 112*  CO2 19* 17*  GLUCOSE 131* 135*  BUN 63* 61*  CREATININE 3.02* 2.56*  CALCIUM 7.6* 7.8*   PT/INR No results for input(s): LABPROT, INR in the last 72 hours. CMP     Component Value Date/Time   NA 143 08/27/2018 0513   K 2.8 (L) 08/27/2018 0513   CL 112 (H) 08/27/2018 0513   CO2 17 (L) 08/27/2018 0513   GLUCOSE 135 (H) 08/27/2018 0513   BUN 61 (H) 08/27/2018 0513   CREATININE 2.56 (H) 08/27/2018 0513   CREATININE 1.07 (H) 02/13/2018 1142   CALCIUM 7.8 (L) 08/27/2018 0513   PROT  4.6 (L) 08/27/2018 0513   ALBUMIN 1.1 (L) 08/27/2018 0513   AST 54 (H) 08/27/2018 0513   ALT 15 08/27/2018 0513   ALKPHOS 220 (H) 08/27/2018 0513   BILITOT 2.0 (H) 08/27/2018 0513   GFRNONAA 17 (L) 08/27/2018 0513   GFRNONAA 49 (L) 02/13/2018 1142   GFRAA 20 (L) 08/27/2018 0513   GFRAA 57 (L) 02/13/2018 1142   Lipase     Component Value Date/Time   LIPASE 30 08/13/2018 1926       Studies/Results: No results found.  Anti-infectives: Anti-infectives (From admission, onward)   Start     Dose/Rate Route Frequency Ordered Stop   08/23/18 1400  piperacillin-tazobactam (ZOSYN) IVPB 2.25 g     2.25 g 100 mL/hr over 30 Minutes Intravenous Every 8 hours 08/23/18 0928     08/21/18 1200  piperacillin-tazobactam (ZOSYN) IVPB 3.375 g  Status:  Discontinued     3.375 g 12.5 mL/hr over 240 Minutes Intravenous Every 8 hours 08/21/18 1102 08/23/18 0928   08/14/18 1000  hydroxychloroquine (PLAQUENIL) tablet 200 mg  Status:  Discontinued    Note to Pharmacy:  Take 1 tablet by mouth daily Monday through Friday only     200 mg Oral Once per day on Mon Tue Wed Thu Fri 08/14/18 0742 08/21/18 1147   08/13/18 2200  doxycycline (VIBRA-TABS) tablet 100 mg  Status:  Discontinued  100 mg Oral Every 12 hours 08/13/18 2134 08/13/18 2135   08/13/18 2200  doxycycline (VIBRAMYCIN) 100 mg in sodium chloride 0.9 % 250 mL IVPB  Status:  Discontinued     100 mg 125 mL/hr over 120 Minutes Intravenous Every 12 hours 08/13/18 2135 08/19/18 1702   08/13/18 2145  cefTRIAXone (ROCEPHIN) 1 g in sodium chloride 0.9 % 100 mL IVPB  Status:  Discontinued     1 g 200 mL/hr over 30 Minutes Intravenous Every 24 hours 08/13/18 2134 08/14/18 0743       Assessment/Plan  POD6, s/p ex lap w/ SBR, LOA, appendectomy for SBO & ischemic/necroticbowel, 4/30 - PT -Extubated.Offpressors. Out of unit. Appreciate TRH help in her care -TPN. Pre-alb <5. NG tube removed yesterday. Advance to FLD.  - LFTs stable. ? Elevation  from TPN. Monitor.  -D/c wound vac. BID WTD dressing changes.  -cont JP drain for now -cont zosyn -Path benign. 58cm SB w/ hemorrhage and prominent necrosis w/ viable margins. Appendix w/ fibrous obliteration and patchy acute serositis.  - Mobilize and IS   Acute on chronic renal failure -Continued good UO (1.6 L/24 hours). Cr cont to improve 3.21>3.19>3.02>2.56 - Cont fluids, but has a history of CHF as well. Defer fluid balance to TRH. Consider removal of foley as renal function returns.   ICD/CAD/CHF -per cardiology.  -on amio  Acute Respiratory Failure - Off vent. Tachypnea this AM  Anemia  - Hgb improved from 7.8 (5/5) to 8.3. VSS  FEN -TPN, FLD, clamp NG tube, K 2.8 & Mg 1.7 (being replaced) VTE -Heparin, SCDs, Mobilize as tolerating with PT.  ID -zosyn 4/30 >> WBC downtrending47.6>31.8>25.4>13.8>10.2>; afebrile  Addendum: Wound vac change this morning by [facility] nurse. Wound was reported to be slick, without noticeable granulation tissue. Wound vac was d/c'd. Will switch back to WTD dressing changes BID.    LOS: 14 days    Jillyn Ledger , [facility] 08/27/2018, 8:36 AM Pager: [phone]

## 2018-08-27 NOTE — Progress Notes (Signed)
PROGRESS NOTE    [name]  UOH:[id number] DOB: April 26, 1940 DOA: 08/13/2018 PCP: [name], MD  Brief Narrative: PCCM Pick up 5/5 Is a chronically ill 78 year old female with history of chronic systolic CHF, nonsustained V. tach, pericardial effusion, CAD, AICD, chronic diastolic CHF, chronic kidney disease stage III, hypertension, rheumatoid arthritis, anemia of chronic disease was admitted to [facility] on 4/22 with small bowel obstruction, also started on antibiotics for possible pneumonia -Failed conservative management developed high-grade small bowel obstruction by 4/28 -4/29 transferred to [facility] -4/30 went to the OR underwent ex lap with lysis of adhesion, bowel resection for ischemic bowel, returned to the ICU on ventilator, hypotensive -4/30 shocked for VT, started on pressors -5/1: Developed progressive renal failure oliguria -5/2: Off pressors -5/3: Extubated General surgery following, currently on TNA, awaiting return of bowel function 5/5: Transferred to [place]:   Septic shock -Resolved -Status post VDRF and pressor support, extubated 5/3, weaned off pressors 5/2. Stable  Small bowel obstruction, ischemic colon -Failed conservative management of bowel obstruction at [facility] -On 4/30 underwent ex lap for SBO and small bowel resection of ischemic necrotic bowel -Currently on IV Zosyn, day 5 -On TNA awaiting return of bowel function and NG tube for decompression -Per general surgery -Currently on full liquid diet  Acute kidney injury on CKD stage II -Baseline creatinine around 1.2 -Creatinine worsened to 3.2, in the setting of septic shock, ATN, ischemic bowel -Creatinine continues to gradually improve, down to 2.56.  Persisting severe hypokalemia and non-anion gap metabolic acidosis in the absence of GI losses. -Continue TNA, renal dosing medications.   -Gentle half NS with potassium for 12 hours -Improving.  Chronic  systolic CHF, AICD -EF of 30% with by V ICD -Off diuretics, ACE inhibitor in the setting of AKI and septic shock -Cardiology on board and awaiting to start oral medications when she is able to tolerate diet adequately.  Acute on chronic anemia -Acute on chronic, no overt bleeding -Slight worsening of hemoglobin, monitor, transfuse if continues to trend down -Check anemia panel -Hemoglobin stable compared to yesterday.  Paroxysmal atrial fibrillation -Status post DC cardioversion on 4/29, currently in sinus rhythm -On IV amiodarone, cardiology following.  She is AV pacing.  Transition to oral amiodarone when tolerating oral well.  Severe protein calorie malnutrition -Starting liquid diet, add supplements as tolerated pending return of bowel function  History of CAD -Stable, restart beta-blockers in 1 to 2 days  Thrombocytopenia Platelet counts continue to gradually drift down.  Unclear etiology.?  Acute illness.  Follow closely.  DVT prophylaxis: Heparin subcutaneous Code Status: Full code Family Communication: Unsuccessfully attempted to reach spouse via phone, no option to leave voicemail message either. Disposition Plan: Will likely need rehab  Consultants:   Cardiology  PCCM  Surgery     Procedures:   -DC cardioversion 4/29  -4/30 :Ex lap with lysis of adhesion, bowel resection for ischemic bowel,  Patient has right IJ, PPM, Foley catheter and abdominal wound VAC.  Antimicrobials:    Subjective: Appears to be extremely hard of hearing.  Thereby difficult history.  Not much available from her.  As per RN, no acute issues noted.  Objective: Vitals:   08/27/18 0354 08/27/18 0954 08/27/18 1219 08/27/18 1558  BP: (!) 127/54 (!) 124/55 (!) 144/59 (!) 122/57  Pulse: 90 88 92   Resp: (!) 22 (!) 28 (!) 31   Temp: 98.8 F (37.1 C) (!) 97.4 F (36.3 C)  98.2 F (36.8 C) 97.6 F (36.4 C)  TempSrc: Oral Oral Oral Oral  SpO2: 95% 96% 95% 95%  Weight:       Height:        Intake/Output Summary (Last 24 hours) at 08/27/2018 1745 Last data filed at 08/27/2018 1600 Gross per 24 hour  Intake 2665.22 ml  Output 521 ml  Net 2144.22 ml   Filed Weights   08/14/18 0500 08/22/18 0625 08/24/18 0500  Weight: 64.8 kg 70.3 kg 72.8 kg    Examination:  General exam: Early, frail chronically ill female, very hard of hearing lying comfortably propped up in bed. Respiratory system: Slightly diminished breath sounds in the bases but otherwise clear to auscultation.  No increased work of breathing. Cardiovascular system: S1 & S2 heard, RRR.  No JVD or murmurs.  Trace pitting all extremity edema.  Telemetry personally reviewed: AV paced rhythm.  Gastrointestinal system: Abdomen is soft, nondistended, midline surgical incision with wound VAC and JP drain in the right, bowel sounds diminished but present.  No significant change. Central nervous system: Alert and oriented to self.  No focal deficits. Extremities: right IJ PICC line.  Right forearm dressing clean and dry. Skin: No rashes, lesions or ulcers Psychiatry:  Mood & affect appropriate.     Data Reviewed:   CBC: Recent Labs  Lab 08/22/18 0401 08/23/18 0354 08/24/18 0444 08/25/18 0415 08/26/18 0500  WBC 47.6* 31.8* 25.4* 13.8* 10.2  NEUTROABS  --   --   --   --  8.5*  HGB 11.6* 9.0* 8.2* 7.8* 8.3*  HCT 36.3 28.0* 25.1* 23.5* 24.1*  MCV 85.6 85.4 82.8 80.5 77.0*  PLT 148* 116* 102* 100* 92*   Basic Metabolic Panel: Recent Labs  Lab 08/23/18 0354 08/24/18 0444 08/25/18 0415 08/26/18 0500 08/27/18 0513  NA 148* 147* 148* 147* 143  K 4.7 4.4 3.7 2.6* 2.8*  CL 119* 119* 120* 115* 112*  CO2 17* 16* 17* 19* 17*  GLUCOSE 124* 93 65* 131* 135*  BUN 57* 68* 65* 63* 61*  CREATININE 2.92* 3.21* 3.19* 3.02* 2.56*  CALCIUM 7.4* 7.6* 7.4* 7.6* 7.8*  MG 1.9  --   --  2.2 1.7  PHOS  --   --   --  3.4 2.2*   GFR: Estimated Creatinine Clearance: 17.4 mL/min (A) (by C-G formula based on SCr of  2.56 mg/dL (H)). Liver Function Tests: Recent Labs  Lab 08/21/18 1202 08/24/18 0444 08/26/18 0500 08/27/18 0513  AST 32 55* 60* 54*  ALT 16 10 13 15   ALKPHOS 88 201* 226* 220*  BILITOT 1.8* 1.8* 2.1* 2.0*  PROT 5.1* 4.6* 4.9* 4.6*  ALBUMIN 2.2* 1.3* 1.2* 1.1*   Cardiac Enzymes: Recent Labs  Lab 08/21/18 1202 08/21/18 1925 08/22/18 0401  TROPONINI 0.05* 0.08* 0.11*   CBG: Recent Labs  Lab 08/26/18 2024 08/27/18 0352 08/27/18 0805 08/27/18 1206 08/27/18 1609  GLUCAP 136* 126* 115* 174* 113*   Lipid Profile: Recent Labs    08/26/18 0500  TRIG 78   Urine analysis:    Component Value Date/Time   COLORURINE AMBER (A) 08/13/2018 1849   APPEARANCEUR CLOUDY (A) 08/13/2018 1849   LABSPEC 1.016 08/13/2018 1849   PHURINE 5.0 08/13/2018 1849   GLUCOSEU NEGATIVE 08/13/2018 1849   HGBUR MODERATE (A) 08/13/2018 1849   BILIRUBINUR NEGATIVE 08/13/2018 1849   KETONESUR NEGATIVE 08/13/2018 1849   PROTEINUR 30 (A) 08/13/2018 1849   UROBILINOGEN 0.2 07/17/2013 2240   NITRITE NEGATIVE 08/13/2018 1849   LEUKOCYTESUR  TRACE (A) 08/13/2018 1849    Recent Results (from the past 240 hour(s))  Surgical pcr screen     Status: Abnormal   Collection Time: 08/20/18 11:50 PM  Result Value Ref Range Status   MRSA, PCR POSITIVE (A) NEGATIVE Final    Comment: RESULT CALLED TO, READ BACK BY AND VERIFIED WITH: I BATOON RN 08/21/18 0110 JDW    Staphylococcus aureus POSITIVE (A) NEGATIVE Final    Comment: (NOTE) The Xpert SA Assay (FDA approved for NASAL specimens in patients 70 years of age and older), is one component of a comprehensive surveillance program. It is not intended to diagnose infection nor to guide or monitor treatment. Performed at [facility], [place] [facility] [address] [place], [place] [address]          Radiology Studies: No results found.      Scheduled Meds: . chlorhexidine  15 mL Mouth Rinse BID  . heparin injection (subcutaneous)  5,000 Units  Subcutaneous Q8H  . insulin aspart  0-9 Units Subcutaneous Q4H  . mouth rinse  15 mL Mouth Rinse q12n4p  . mupirocin ointment   Nasal BID  . pantoprazole (PROTONIX) IV  40 mg Intravenous QHS   Continuous Infusions: . amiodarone 30 mg/hr (08/27/18 1209)  . piperacillin-tazobactam (ZOSYN)  IV 2.25 g (08/27/18 1501)  . potassium PHOSPHATE IVPB (in mmol) 15 mmol (08/27/18 1645)  . TPN ADULT (ION) 55 mL/hr at 08/27/18 0458  . TPN ADULT (ION)       LOS: 14 days    Vernell Leep, MD, FACP, [facility]. [facility]  To contact the attending provider between 7A-7P or the covering provider during after hours 7P-7A, please log into the web site [email or web address] and access using universal [place] password for that web site. If you do not have the password, please call the hospital operator.

## 2018-08-27 NOTE — Consult Note (Addendum)
[place] Nurse wound consult note I spoke with Alferd Apa, Surgical PA about the appearance of the wound.  My impression of the wound now, based on comparison with the photos in the record from 5/02 where the photo represents a pink, healthy appearing wound with exposed golden adipose globules, then on 5/04 the photo demonstrates a pale wound bed with some brown discoloration in the inferior aspect of the wound bed, is that today it is slick, does not appear pink, and is without odor.  I would have expected this wound, after 48 hours of NPWT, to appear more like the pink, robust wound on 5/2.  It does not.    Val Riles, RN, MSN, CWOCN, CNS-BC, pager [phone]

## 2018-08-27 NOTE — Progress Notes (Signed)
PHARMACY - ADULT TOTAL PARENTERAL NUTRITION CONSULT NOTE   Pharmacy Consult for TPN Indication: s/p ex lap with SBR for SBO and ischemic/necroticbowel  Patient Measurements: Height: 5\' 4"  (162.6 cm) Weight: 160 lb 7.9 oz (72.8 kg) IBW/kg (Calculated) : 54.7 TPN AdjBW (KG): 64.8 Body mass index is 27.55 kg/m.  Assessment:  78 year old female with PMH significant for anemia, CAD s/p AICD placement, breast cancer, CKD III, CHF, HLD, impaired hearing, and RA. She presented to [facility] on 4/23 with CAP and SBO. CT on 4/28 show persistent SBO and patient was transferred to [facility] for surgery. On 4/30, an ex lap, lysis of adhesions and small bowel resection was performed. As of 5/4, Patient has been ~12 days without nutrition and pharmacy was consulted 5/4 for TPN with plan for continued bowel rest and NG until bowel function returns.  GI: NGT removed 5/5, JP drain 75 mls. PPI IV. Pre-albumin <5. Dysphagia. Bedside swallow 5/5 - ok for thin liquids only. No N/V. +Flatus. LBM 5/6. Endo: No history of diabetes. CBGs <150. Insulin requirements in the past 24 hours: 3 unit Lytes: K 2.8 (after 5 runs + 1/2 NS with 40 K x12 hrs on 5/5), Phos down 2.2, Mg down 1.7 (lytes were held in TPN); Corr Ca 10.1 (alb 1.1), Na/Cl improving, CO2 low Renal: Scr 2.56 (trending down), BUN 61, GFR 20, off CRRT - no plan for dialysis. UOP 1 mL/kg/hr. I/O -1.6L last 24hrs. Net +1.6L.  Pulm: Extubated on 5/3. RA Cards: Amiodarone drip at 30 mg/hr (s/p VT arrest earlier in admission); HR paced at 89, BP 120-130s/50-60s Hepatobil: AST trending back down (54); T Bili down to 2; ALT wnl. TG 78. Neuro: oriented to person only, complains of abdominal pain ID: WBC 10.2, afebrile on Zosyn 5/2>>present.   TPN Access: CVC triple lumen placed 4/30 TPN start date: 5/4 Nutritional Goals (per RD recommendation on 5/4, re-estimated post extubation): Kcal: 2000-2200 kcal Protein:  100-120 grams Fluid:  >/= 2 L/day   Goal TPN rate is 80  ml/hr (provides 120 g of protein, 270 g of dextrose, and 60 g of lipids which provides 2000 kCals per day, meeting 100% of patient needs)  Current Nutrition:  Clear liquids TPN at 55 mL/hr - titration slow due to lytes  Plan:  Continue TPN at 55 mL/hr until lytes are stabilized.  This TPN provides  82.5g of protein, 186g of dextrose, and 41.1g of lipids which provides 1374 kCals per day, meeting 69% of patient's protein and calorie needs. Electrolytes in TPN: Per discussion with Dr. Algis Liming, will continue with NO lytes in TPN and replace outside of TPN. If renal function continues to improve and lytes remain low, will consider adding back at low amount. Maximize acetate. Add MVI to TPN - trace elements are on back order, will only place in TPN on Monday, Wednesday and Friday Continue sensitive SSI and adjust as needed Monitor TPN labs Follow-up renal function and diuresis  Per Discussion with Dr. Algis Liming (who ok'd w/ surgery), will order as follows and recheck a BMET this PM: Magnesium 1 gm IV x1 K-Phos 15 mmol IV x1 KCl 10 mEq x 5    Sloan Leiter, PharmD, BCPS, BCCCP Clinical Pharmacist Please refer to [facility] for [place] numbers 08/27/2018, 7:13 AM

## 2018-08-28 DIAGNOSIS — E872 Acidosis: Secondary | ICD-10-CM

## 2018-08-28 DIAGNOSIS — N189 Chronic kidney disease, unspecified: Secondary | ICD-10-CM

## 2018-08-28 DIAGNOSIS — K567 Ileus, unspecified: Secondary | ICD-10-CM

## 2018-08-28 DIAGNOSIS — K922 Gastrointestinal hemorrhage, unspecified: Secondary | ICD-10-CM

## 2018-08-28 DIAGNOSIS — K9189 Other postprocedural complications and disorders of digestive system: Secondary | ICD-10-CM

## 2018-08-28 LAB — CBC
HCT: 22.8 % — ABNORMAL LOW (ref 36.0–46.0)
HCT: 23.1 % — ABNORMAL LOW (ref 36.0–46.0)
Hemoglobin: 8 g/dL — ABNORMAL LOW (ref 12.0–15.0)
Hemoglobin: 7.8 g/dL — ABNORMAL LOW (ref 12.0–15.0)
MCH: 27.2 pg (ref 26.0–34.0)
MCH: 26.8 pg (ref 26.0–34.0)
MCHC: 35.1 g/dL (ref 30.0–36.0)
MCHC: 33.8 g/dL (ref 30.0–36.0)
MCV: 77.6 fL — ABNORMAL LOW (ref 80.0–100.0)
MCV: 79.4 fL — ABNORMAL LOW (ref 80.0–100.0)
Platelets: 102 10*3/uL — ABNORMAL LOW (ref 150–400)
Platelets: 104 10*3/uL — ABNORMAL LOW (ref 150–400)
RBC: 2.94 MIL/uL — ABNORMAL LOW (ref 3.87–5.11)
RBC: 2.91 MIL/uL — ABNORMAL LOW (ref 3.87–5.11)
RDW: 21 % — ABNORMAL HIGH (ref 11.5–15.5)
RDW: 21.8 % — ABNORMAL HIGH (ref 11.5–15.5)
WBC: 13.4 10*3/uL — ABNORMAL HIGH (ref 4.0–10.5)
WBC: 12.1 10*3/uL — ABNORMAL HIGH (ref 4.0–10.5)
nRBC: 0 % (ref 0.0–0.2)
nRBC: 0.2 % (ref 0.0–0.2)

## 2018-08-28 LAB — BASIC METABOLIC PANEL
Anion gap: 10 (ref 5–15)
BUN: 68 mg/dL — ABNORMAL HIGH (ref 8–23)
CO2: 14 mmol/L — ABNORMAL LOW (ref 22–32)
Calcium: 7.7 mg/dL — ABNORMAL LOW (ref 8.9–10.3)
Chloride: 115 mmol/L — ABNORMAL HIGH (ref 98–111)
Creatinine, Ser: 2.51 mg/dL — ABNORMAL HIGH (ref 0.44–1.00)
GFR calc Af Amer: 20 mL/min — ABNORMAL LOW (ref >60)
GFR calc non Af Amer: 18 mL/min — ABNORMAL LOW (ref >60)
Glucose, Bld: 151 mg/dL — ABNORMAL HIGH (ref 70–99)
Potassium: 3.6 mmol/L (ref 3.5–5.1)
Sodium: 139 mmol/L (ref 135–145)

## 2018-08-28 LAB — GLUCOSE, CAPILLARY
Glucose-Capillary: 105 mg/dL — ABNORMAL HIGH (ref 70–99)
Glucose-Capillary: 117 mg/dL — ABNORMAL HIGH (ref 70–99)
Glucose-Capillary: 128 mg/dL — ABNORMAL HIGH (ref 70–99)
Glucose-Capillary: 127 mg/dL — ABNORMAL HIGH (ref 70–99)
Glucose-Capillary: 168 mg/dL — ABNORMAL HIGH (ref 70–99)
Glucose-Capillary: 138 mg/dL — ABNORMAL HIGH (ref 70–99)

## 2018-08-28 LAB — COMPREHENSIVE METABOLIC PANEL
ALT: 18 U/L (ref 0–44)
AST: 51 U/L — ABNORMAL HIGH (ref 15–41)
Albumin: 1.1 g/dL — ABNORMAL LOW (ref 3.5–5.0)
Alkaline Phosphatase: 223 U/L — ABNORMAL HIGH (ref 38–126)
Anion gap: 9 (ref 5–15)
BUN: 68 mg/dL — ABNORMAL HIGH (ref 8–23)
CO2: 15 mmol/L — ABNORMAL LOW (ref 22–32)
Calcium: 7.8 mg/dL — ABNORMAL LOW (ref 8.9–10.3)
Chloride: 115 mmol/L — ABNORMAL HIGH (ref 98–111)
Creatinine, Ser: 2.5 mg/dL — ABNORMAL HIGH (ref 0.44–1.00)
GFR calc Af Amer: 20 mL/min — ABNORMAL LOW (ref >60)
GFR calc non Af Amer: 18 mL/min — ABNORMAL LOW (ref >60)
Glucose, Bld: 128 mg/dL — ABNORMAL HIGH (ref 70–99)
Potassium: 3.7 mmol/L (ref 3.5–5.1)
Sodium: 139 mmol/L (ref 135–145)
Total Bilirubin: 2.2 mg/dL — ABNORMAL HIGH (ref 0.3–1.2)
Total Protein: 4.5 g/dL — ABNORMAL LOW (ref 6.5–8.1)

## 2018-08-28 LAB — PHOSPHORUS
Phosphorus: 2.4 mg/dL — ABNORMAL LOW (ref 2.5–4.6)
Phosphorus: 2.5 mg/dL (ref 2.5–4.6)

## 2018-08-28 LAB — MAGNESIUM
Magnesium: 1.6 mg/dL — ABNORMAL LOW (ref 1.7–2.4)
Magnesium: 2 mg/dL (ref 1.7–2.4)

## 2018-08-28 MED ORDER — MAGNESIUM SULFATE 2 GM/50ML IV SOLN
2.0000 g | Freq: Once | INTRAVENOUS | Status: AC
  Administered 2018-08-28: 2 g via INTRAVENOUS
  Filled 2018-08-28: qty 50

## 2018-08-28 MED ORDER — POTASSIUM PHOSPHATES 15 MMOLE/5ML IV SOLN
15.0000 mmol | Freq: Once | INTRAVENOUS | Status: AC
  Administered 2018-08-28: 13:00:00 15 mmol via INTRAVENOUS
  Filled 2018-08-28: qty 5

## 2018-08-28 MED ORDER — ENSURE ENLIVE PO LIQD
237.0000 mL | Freq: Three times a day (TID) | ORAL | Status: DC
  Administered 2018-08-28 – 2018-09-01 (×12): 237 mL via ORAL

## 2018-08-28 MED ORDER — SODIUM BICARBONATE 8.4 % IV SOLN
INTRAVENOUS | Status: AC
  Filled 2018-08-28: qty 1000

## 2018-08-28 MED ORDER — POTASSIUM CHLORIDE 10 MEQ/50ML IV SOLN
10.0000 meq | INTRAVENOUS | Status: AC
  Administered 2018-08-28 (×3): 10 meq via INTRAVENOUS
  Filled 2018-08-28 (×3): qty 50

## 2018-08-28 MED ORDER — TRAVASOL 10 % IV SOLN
INTRAVENOUS | Status: AC
  Administered 2018-08-28: 18:00:00 via INTRAVENOUS
  Filled 2018-08-28: qty 825

## 2018-08-28 MED ORDER — SODIUM BICARBONATE 8.4 % IV SOLN
INTRAVENOUS | Status: DC
  Administered 2018-08-28: 17:00:00 via INTRAVENOUS
  Filled 2018-08-28: qty 150

## 2018-08-28 MED ORDER — CARVEDILOL 3.125 MG PO TABS
3.1250 mg | ORAL_TABLET | Freq: Two times a day (BID) | ORAL | Status: DC
  Administered 2018-08-28 – 2018-09-19 (×45): 3.125 mg via ORAL
  Filled 2018-08-28 (×45): qty 1

## 2018-08-28 MED ORDER — AMIODARONE HCL 200 MG PO TABS
200.0000 mg | ORAL_TABLET | Freq: Every day | ORAL | Status: DC
  Administered 2018-08-28 – 2018-09-19 (×23): 200 mg via ORAL
  Filled 2018-08-28 (×23): qty 1

## 2018-08-28 NOTE — Progress Notes (Signed)
Occupational Therapy Treatment Patient Details Name: [name] MRN: [id number] DOB: 1940-12-29 Today's Date: 08/28/2018    History of present illness 78 year old female with significant cardiac history that is post exploratory laparotomy for small bowel obstruction with associated ischemic bowel on 4/30, return to the intensive care on mechanical ventilator, critical care asked to assist with care.  VDRF 4/30-5/3.  Also PNA.  GUY:[id number] heart failure with EF 30%, nonsustained VT, pericardial effusion CAD w/ prior AICD, diastolic heart failure, CKD stage III, hypertension, rheumatoid arthritis, anemia of chronic disease.   OT comments  Pt demonstrating improvement in bed mobility and sitting balance at EOB. Self fed with min assist for drinking only. Pt appearing SOB, but VSS throughout session with Sp02>92% on RA.   Follow Up Recommendations  SNF;Supervision/Assistance - 24 hour    Equipment Recommendations  Other (comment)(defer to next venue)    Recommendations for Other Services      Precautions / Restrictions Precautions Precautions: Fall       Mobility Bed Mobility Overal bed mobility: Needs Assistance Bed Mobility: Supine to Sit;Sit to Supine     Supine to sit: Min assist;HOB elevated Sit to supine: Max assist   General bed mobility comments: increased time and min assist for LEs and to raise trunk to sit EOB, assist for all aspects to return to supine  Transfers                      Balance Overall balance assessment: Needs assistance   Sitting balance-Leahy Scale: Fair Sitting balance - Comments: min guard x 20                                   ADL either performed or assessed with clinical judgement   ADL Overall ADL's : Needs assistance/impaired Eating/Feeding: Supervision/ safety;Sitting;Minimal assistance Eating/Feeding Details (indicate cue type and reason): at EOB, self fed with spoon, assisted for drinking from  cup Grooming: Moderate assistance;Sitting;Wash/dry hands                                       Vision       Perception     Praxis      Cognition Arousal/Alertness: Awake/alert Behavior During Therapy: Flat affect Overall Cognitive Status: Impaired/Different from baseline Area of Impairment: Orientation;Memory;Following commands;Safety/judgement;Problem solving                 Orientation Level: Disoriented to;Place;Time;Situation   Memory: Decreased short-term memory Following Commands: Follows one step commands with increased time Safety/Judgement: Decreased awareness of safety;Decreased awareness of deficits   Problem Solving: Slow processing;Decreased initiation;Difficulty sequencing;Requires verbal cues;Requires tactile cues General Comments: pt very HOH, difficult to assess cognition accurately        Exercises Exercises: Other exercises Other Exercises Other Exercises: A/AROM B UEs x 10   Shoulder Instructions       General Comments      Pertinent Vitals/ Pain       Pain Assessment: Faces Faces Pain Scale: Hurts a little bit Pain Location: abdomen Pain Descriptors / Indicators: Grimacing;Discomfort Pain Intervention(s): Monitored during session;Repositioned  Home Living  Prior Functioning/Environment              Frequency  Min 2X/week        Progress Toward Goals  OT Goals(current goals can now be found in the care plan section)  Progress towards OT goals: Progressing toward goals  Acute Rehab OT Goals Patient Stated Goal: get stronger and safe to go home OT Goal Formulation: With family Time For Goal Achievement: 09/08/18 Potential to Achieve Goals: Good  Plan Discharge plan remains appropriate    Co-evaluation                 AM-PAC OT "6 Clicks" Daily Activity     Outcome Measure   Help from another person eating meals?: A Little Help from  another person taking care of personal grooming?: A Lot Help from another person toileting, which includes using toliet, bedpan, or urinal?: Total Help from another person bathing (including washing, rinsing, drying)?: A Lot Help from another person to put on and taking off regular upper body clothing?: A Lot Help from another person to put on and taking off regular lower body clothing?: Total 6 Click Score: 11    End of Session    OT Visit Diagnosis: Unsteadiness on feet (R26.81);Other abnormalities of gait and mobility (R26.89);Muscle weakness (generalized) (M62.81);Other symptoms and signs involving cognitive function   Activity Tolerance Patient tolerated treatment well   Patient Left in bed;with call bell/phone within reach;with chair alarm set   Nurse Communication Other (comment)(NT aware that pt fed herself lunch)        Time: 6333-5456 OT Time Calculation (min): 27 min  Charges: OT General Charges $OT Visit: 1 Visit OT Treatments $Self Care/Home Management : 23-37 mins  Nestor Lewandowsky, OTR/L Acute Rehabilitation Services Pager: [phone] Office: [phone] Malka So 08/28/2018, 1:36 PM

## 2018-08-28 NOTE — Progress Notes (Signed)
Progress Note  Patient Name: [name] Date of Encounter: 08/28/2018  Primary Cardiologist: Kate Sable, MD   Subjective   78 year old female with a history of chronic systolic congestive heart failure, nonsustained ventricular tachycardia, pericardial effusion, coronary artery disease, AICD, chronic kidney disease stage III who was admitted to [facility] with a small bowel obstruction on April 22.  She was started on antibiotics for possible pneumonia.  She underwent a exploratory laparoscopy on April 30 with lysis of adhesions.  She had a bowel resection for ischemic bowel.  She has had progressive renal failure since that time and has had oliguria.  She was extubated on May 3.  She is currently on TNA..  Echocardiogram on August 19, 2018 reveals moderate left ventricular dysfunction.  Her ejection fraction is approximately 30%.  Very hard of hearing  Has started eating .   Drinking some   Inpatient Medications    Scheduled Meds: . chlorhexidine  15 mL Mouth Rinse BID  . heparin injection (subcutaneous)  5,000 Units Subcutaneous Q8H  . insulin aspart  0-9 Units Subcutaneous Q4H  . mouth rinse  15 mL Mouth Rinse q12n4p  . mupirocin ointment   Nasal BID  . pantoprazole (PROTONIX) IV  40 mg Intravenous QHS   Continuous Infusions: . amiodarone 30 mg/hr (08/28/18 0359)  . magnesium sulfate bolus IVPB 2 g (08/28/18 0748)  . piperacillin-tazobactam (ZOSYN)  IV 2.25 g (08/28/18 0522)  . potassium chloride    . potassium PHOSPHATE IVPB (in mmol)    . TPN ADULT (ION) 55 mL/hr at 08/28/18 0359   PRN Meds: acetaminophen **OR** acetaminophen, ondansetron **OR** ondansetron (ZOFRAN) IV, oxyCODONE   Vital Signs    Vitals:   08/27/18 1950 08/27/18 2322 08/28/18 0422 08/28/18 0753  BP: (!) 125/52 (!) 131/52 (!) 110/47 (!) 111/47  Pulse: 87 81 80 78  Resp: (!) 27 (!) 25 (!) 29 (!) 22  Temp: (!) 97.5 F (36.4 C) 97.9 F (36.6 C) 97.8 F (36.6 C) 98.6 F (37 C)   TempSrc: Axillary Oral Oral Oral  SpO2: 100% 94% 97% 100%  Weight:   71.2 kg   Height:        Intake/Output Summary (Last 24 hours) at 08/28/2018 0844 Last data filed at 08/28/2018 0427 Gross per 24 hour  Intake 3866.82 ml  Output 551 ml  Net 3315.82 ml   Last 3 Weights 08/28/2018 08/24/2018 08/22/2018  Weight (lbs) 156 lb 15.5 oz 160 lb 7.9 oz 154 lb 15.7 oz  Weight (kg) 71.2 kg 72.8 kg 70.3 kg      Telemetry     A sensing, V pacing - Personally Reviewed  ECG    No new ECG tracing since 08/21/2018. - Personally Reviewed  Physical Exam   Physical Exam per MD:  GEN: elderly , chronically ill appearing female, NAD  Neck: No JVD Cardiac: RRR,    Respiratory: Clear to auscultation bilaterally. GI: Soft, nontender, non-distended  MS: No edema; No deformity. Neuro:  Nonfocal  Psych: Normal affect   Labs    Chemistry Recent Labs  Lab 08/26/18 0500 08/27/18 0513 08/27/18 1700 08/28/18 0458  NA 147* 143 140 139  K 2.6* 2.8* 3.5 3.7  CL 115* 112* 112* 115*  CO2 19* 17* 18* 15*  GLUCOSE 131* 135* 151* 128*  BUN 63* 61* 64* 68*  CREATININE 3.02* 2.56* 2.46* 2.50*  CALCIUM 7.6* 7.8* 8.0* 7.8*  PROT 4.9* 4.6*  --  4.5*  ALBUMIN 1.2* 1.1*  --  1.1*  AST 60* 54*  --  51*  ALT 13 15  --  18  ALKPHOS 226* 220*  --  223*  BILITOT 2.1* 2.0*  --  2.2*  GFRNONAA 14* 17* 18* 18*  GFRAA 16* 20* 21* 20*  ANIONGAP 13 14 10 9      Hematology Recent Labs  Lab 08/25/18 0415 08/26/18 0500 08/28/18 0458  WBC 13.8* 10.2 13.4*  RBC 2.92* 3.13* 2.94*  HGB 7.8* 8.3* 8.0*  HCT 23.5* 24.1* 22.8*  MCV 80.5 77.0* 77.6*  MCH 26.7 26.5 27.2  MCHC 33.2 34.4 35.1  RDW 19.9* 19.6* 21.0*  PLT 100* 92* 102*    Cardiac Enzymes Recent Labs  Lab 08/21/18 1202 08/21/18 1925 08/22/18 0401  TROPONINI 0.05* 0.08* 0.11*   No results for input(s): TROPIPOC in the last 168 hours.   BNPNo results for input(s): BNP, PROBNP in the last 168 hours.   DDimer No results for input(s): DDIMER in  the last 168 hours.   Radiology    No results found.  Cardiac Studies    Patient Profile     [name] is a 78 y.o. female with a history of chronic combined systolic and diastolic CHFdue to ischemic CMwith EF of 30%on Echo this admissions/p ICD, CAD with remote MI, hypertension, hyperlipidemia, CKD stage III, chronic anemia, and GERDwho was admitted to [facility] on 08/13/2018 with a small bowel obstruction. She was started on antibiotics for possible pneumonia. She was transferred to [facility] on 08/20/2018 for surgical evaluation and underwent an ex-lap for SBO on 08/21/2018 at which time she had a  bowel resection for ischemic bowel. Transferred to ICU post-op for severe hypotension and shock. She has had progressive renal failure since that time with oliguria. She was extubated on 08/24/2018. She is currently on TNA.  Assessment & Plan    Chronic Systolic CHF - CHF exacerbation this admission in the setting of abdominal surgery for SBO on 08/21/2018. - Echo this admission showed LVEF of 30% with inferior basal and inferoseptal akinesis. - Currently euvolemic and not on any diuretics. ** - No ACEi/ARB due to renal function. - Waiting for patient to start taking medications and food by mouth to continue to titrate up her medications. Patient currently on thin liquid diet with TPN. - Continue to monitor volume status closely.  Paroxysmal Atrial Fibrillation - She is AV pacing.  - Currently on IV Amiodarone. Will change to oral Amiodarone when she is able to take PO.  - Not currently on anticoagulation.   AV Pacing - Pacer rate has been increased to 90. Per Dr. Acie Fredrickson, anticipate decreasing pacing rate back down to previous rate which was probably around 70 bpm.  Anemia Following Surgery - Hemoglobin low but stable at 8.0 today. - Management per primary team.   Acute on Chronic Kidney Disease - Serum creatinine peaked at 3.21 and is 2.50 today. - Continue to avoid nephrotoxic  agents. - Check daily BMETs.  Otherwise, per primary team.   For questions or updates, please contact [place] Please consult [email or web address] for contact info under        Signed, Darreld Mclean, PA-C  08/28/2018, 8:44 AM     Attending Note:   The patient was seen and examined.  Agree with assessment and plan as noted above.  Changes made to the above note as needed.  Patient seen and independently examined with Sande Rives, PA .   We discussed all aspects of the encounter. I  agree with the assessment and plan as stated above.  1.   PAF:   Have switched amio to PO .  She is in NSR currently   2.  Chronic systolic chf:   Continue supportive care Not on ACE-I or ARB due to acute renal insufficiency  Cont supportive care     I have spent a total of 40 minutes with patient reviewing hospital  notes , telemetry, EKGs, labs and examining patient as well as establishing an assessment and plan that was discussed with the patient. > 50% of time was spent in direct patient care.  Currently , she has no active cardiac issues We will sign off and are available to see her if needed   [facility] will sign off.   Medication Recommendations:  Continue supportive care  Other recommendations (labs, testing, etc):   Follow up as an outpatient:   Dr. Charlynn Court, Brooke Bonito., MD, [facility] 08/28/2018, 3:09 PM [address],  [place] Pager [phone]

## 2018-08-28 NOTE — Progress Notes (Addendum)
7 Days Post-Op  Subjective: CC: Bloody stool Patient did well overnight per nursing staff. Tolerating diet, without n/v or abdominal distension. Had 6 loose BM yesterday and 1 this morning that was streaked with BRB. VSS & Hgb stable. Did get up some with PT   Objective: Vital signs in last 24 hours: Temp:  [97.5 F (36.4 C)-98.6 F (37 C)] 97.9 F (36.6 C) (05/07 0914) Pulse Rate:  [78-92] 79 (05/07 0914) Resp:  [13-31] 23 (05/07 0914) BP: (110-144)/(47-59) 110/55 (05/07 0914) SpO2:  [94 %-100 %] 100 % (05/07 0914) Weight:  [71.2 kg] 71.2 kg (05/07 0422) Last BM Date: 08/28/18  Intake/Output from previous day: 05/06 0701 - 05/07 0700 In: 3866.8 [P.O.:240; I.V.:2826.3; IV Piggyback:730.5] Out: 571 [Urine:550; Drains:20; Stool:1] Intake/Output this shift: Total I/O In: 240 [P.O.:240] Out: -   PE: Gen: Awake and alert, NAD.  Heart: Regular rate Lungs: OffO2. Decreased breath sounds at bases. Abd: Soft, nodistension, normoactiveBS, appropriately tender without peritonitis. Midline wound with some granulation tissue forming at the base. No dehiscence, or signs of infection. Please see picture below.JP drain in place. 20cc output overnight, bloody serosanguinous fluid.  Msk: Trace edema      Lab Results:  Recent Labs    08/26/18 0500 08/28/18 0458  WBC 10.2 13.4*  HGB 8.3* 8.0*  HCT 24.1* 22.8*  PLT 92* 102*   BMET Recent Labs    08/27/18 1700 08/28/18 0458  NA 140 139  K 3.5 3.7  CL 112* 115*  CO2 18* 15*  GLUCOSE 151* 128*  BUN 64* 68*  CREATININE 2.46* 2.50*  CALCIUM 8.0* 7.8*   PT/INR No results for input(s): LABPROT, INR in the last 72 hours. CMP     Component Value Date/Time   NA 139 08/28/2018 0458   K 3.7 08/28/2018 0458   CL 115 (H) 08/28/2018 0458   CO2 15 (L) 08/28/2018 0458   GLUCOSE 128 (H) 08/28/2018 0458   BUN 68 (H) 08/28/2018 0458   CREATININE 2.50 (H) 08/28/2018 0458   CREATININE 1.07 (H) 02/13/2018 1142   CALCIUM 7.8  (L) 08/28/2018 0458   PROT 4.5 (L) 08/28/2018 0458   ALBUMIN 1.1 (L) 08/28/2018 0458   AST 51 (H) 08/28/2018 0458   ALT 18 08/28/2018 0458   ALKPHOS 223 (H) 08/28/2018 0458   BILITOT 2.2 (H) 08/28/2018 0458   GFRNONAA 18 (L) 08/28/2018 0458   GFRNONAA 49 (L) 02/13/2018 1142   GFRAA 20 (L) 08/28/2018 0458   GFRAA 57 (L) 02/13/2018 1142   Lipase     Component Value Date/Time   LIPASE 30 08/13/2018 1926       Studies/Results: No results found.  Anti-infectives: Anti-infectives (From admission, onward)   Start     Dose/Rate Route Frequency Ordered Stop   08/23/18 1400  piperacillin-tazobactam (ZOSYN) IVPB 2.25 g     2.25 g 100 mL/hr over 30 Minutes Intravenous Every 8 hours 08/23/18 0928     08/21/18 1200  piperacillin-tazobactam (ZOSYN) IVPB 3.375 g  Status:  Discontinued     3.375 g 12.5 mL/hr over 240 Minutes Intravenous Every 8 hours 08/21/18 1102 08/23/18 0928   08/14/18 1000  hydroxychloroquine (PLAQUENIL) tablet 200 mg  Status:  Discontinued    Note to Pharmacy:  Take 1 tablet by mouth daily Monday through Friday only     200 mg Oral Once per day on Mon Tue Wed Thu Fri 08/14/18 0742 08/21/18 1147   08/13/18 2200  doxycycline (VIBRA-TABS) tablet 100 mg  Status:  Discontinued     100 mg Oral Every 12 hours 08/13/18 2134 08/13/18 2135   08/13/18 2200  doxycycline (VIBRAMYCIN) 100 mg in sodium chloride 0.9 % 250 mL IVPB  Status:  Discontinued     100 mg 125 mL/hr over 120 Minutes Intravenous Every 12 hours 08/13/18 2135 08/19/18 1702   08/13/18 2145  cefTRIAXone (ROCEPHIN) 1 g in sodium chloride 0.9 % 100 mL IVPB  Status:  Discontinued     1 g 200 mL/hr over 30 Minutes Intravenous Every 24 hours 08/13/18 2134 08/14/18 0743       Assessment/Plan  POD7, s/p ex lapw/SBR, LOA, appendectomy for SBO&ischemic/necroticbowel, 4/30 - PT -Extubated.Offpressors.Out of unit.AppreciateTRHhelp in her care -Cont TPN till inc intake. Pre-alb <5. Adv to DYS1 and as  tolerated per speech  - Bloody BM this AM. Hgb stable. VSS. Monitor. Repeat labs in AM. If becomes anemic, d/c blood thinner and transfuse PRN.   - LFTs stable. ? Elevation from TPN. Monitor. - BID WTD dressing changes.  - cont JP drain for now - cont zosyn - Path benign. 58cm SB w/ hemorrhage and prominent necrosis w/ viable margins. Appendix w/ fibrous obliteration and patchy acute serositis.  - Mobilize and IS  Acute on chronic renal failure -UO (570mL/24 hours). Cr slightly up this AM 3.21>3.19>3.02>2.56>2.46>2.5 - Cont fluids, but has a history of CHF as well. Defer fluid balance to TRH. Can remove foley per [facility]  ICD/CAD/CHF -per cardiology.  -on amio  Acute Respiratory Failure - Off vent. Improving. Per TRH  Anemia  - Hgb improved from 7.8 (5/5) to 8.3. VSS  FEN -TPN, DYS1 (AAT per speech), K 3.7 & Mg 1.6 (being replaced) VTE -Heparin, SCDs, Mobilize as tolerating with PT.  ID -zosyn 4/30 >> WBC47.6>31.8>25.4>13.8>10.2>13.4; afebrile   LOS: 15 days    Jillyn Ledger , [facility] 08/28/2018, 10:01 AM Pager: [phone]

## 2018-08-28 NOTE — Progress Notes (Signed)
Nutrition Follow-up  DOCUMENTATION CODES:   Severe malnutrition in context of acute illness/injury  INTERVENTION:    Ensure Enlive po TID, each supplement provides 350 kcal and 20 grams of protein  Continue TPN- management per pharmacy   NUTRITION DIAGNOSIS:   Severe Malnutrition related to acute illness(SBO) as evidenced by energy intake < or equal to 50% for > or equal to 5 days, mild fat depletion, moderate muscle depletion.  Ongoing  GOAL:   Patient will meet greater than or equal to 90% of their needs  Not met- addressed via TPN  MONITOR:   Diet advancement, Weight trends, Labs, Vent status, TF tolerance, Skin, I & O's  REASON FOR ASSESSMENT:   Ventilator    ASSESSMENT:   Patient with PMH significant for anemia, CAD s/p AICD placement, breast cancer, CKD III, CHF, HLD, impaired hearing, and RA. Presented to [facility] on 4/23 with CAP and SBO.    4/28- CT scan showed persistent SBO, tx to [facility] for surgery 4/30- ex lap, lysis of adhesions, small bowel resection due to ischemic changes 5/3- extubated  5/4- TPN started  5/5- NGT removed  5/6- diet advanced to full liquid   RD working remotely.  Spoke with RN via phone. Pt more lethargic this am. Tolerated grits and ice cream for breakfast. Meal completions charted as 20-25% for pt's last two meals. Per RN pt holding back on PO intake as she is afraid it will cause pain in her abdomen. RD to provide Ensure to maximize calories and protein.   Pt currently receiving TPN @ 55 ml/hr to provide 83 grams of protein and 1374 kcal. Plan to increase to goal rate tomorrow. As PO intake increases, plan to wean TPN.   Weight noted to increase from 70.3 kg on 5/1 to 71.2 kg today.   I/O: +5,192 ml since admit UOP: 550 ml x 24 hrs  JP drain: 20 ml x 24 hrs   Drips: amiodarone, potassium phosphate Medications: SS novolog,  Labs: Phosphorus 2.4 (L) Mg 1.6 (L) CBG 105-178  Diet Order:   Diet Order            Diet full liquid  Room service appropriate? No; Fluid consistency: Thin  Diet effective now              EDUCATION NEEDS:   Not appropriate for education at this time  Skin:  Skin Assessment: Skin Integrity Issues: Skin Integrity Issues:: Stage II, Incisions Stage II: buttocks Incisions: open abdomen  Last BM:  5/7  Height:   Ht Readings from Last 1 Encounters:  08/21/18 '5\' 4"'  (1.626 m)    Weight:   Wt Readings from Last 1 Encounters:  08/28/18 71.2 kg    Ideal Body Weight:  54.5 kg  BMI:  Body mass index is 26.94 kg/m.  Estimated Nutritional Needs:   Kcal:  2000-2200 kcal  Protein:  100-120 grams  Fluid:  >/= 2 L/day   [name] RD, LDN Clinical Nutrition Pager # - [phone]

## 2018-08-28 NOTE — Progress Notes (Signed)
PHARMACY - ADULT TOTAL PARENTERAL NUTRITION CONSULT NOTE   Pharmacy Consult for TPN Indication: s/p ex lap with SBR for SBO and ischemic/necroticbowel  Patient Measurements: Height: 5\' 4"  (162.6 cm) Weight: 156 lb 15.5 oz (71.2 kg) IBW/kg (Calculated) : 54.7 TPN AdjBW (KG): 64.8 Body mass index is 26.94 kg/m.  Assessment:  78 year old female with PMH significant for anemia, CAD s/p AICD placement, breast cancer, CKD III, CHF, HLD, impaired hearing, and RA. She presented to [facility] on 4/23 with CAP and SBO. CT on 4/28 show persistent SBO and patient was transferred to [facility] for surgery. On 4/30, an ex lap, lysis of adhesions and small bowel resection was performed. As of 5/4, Patient has been ~12 days without nutrition and pharmacy was consulted 5/4 for TPN with plan for continued bowel rest and NG until bowel function returns.  GI: NGT removed 5/5, JP drain 75 mls. PPI IV. Pre-albumin <5. Dysphagia. Bedside swallow 5/5 - ok for thin liquids only. No N/V. +Flatus. LBM 5/6 x6.  Endo: No history of diabetes. CBGs <150. Insulin requirements in the past 24 hours: 5 unit Lytes: K 3.7 (after total of 9 runs on 5/5), Phos rising at 2.4 (slightly below goal), Mg down 1.6 (lytes were held in TPN); Corr Ca 10.1 (alb 1.1), CO2 low and trending down further (maximizing acetate in TPN) Renal: Scr 2.50 (trending down), BUN 61, GFR 20, off CRRT - no plan for dialysis. UOP 0.3 mL/kg/hr. I/O +3.3L last 24hrs. Net +4.4L.  Pulm: Extubated on 5/3. RA Cards: Amiodarone drip at 30 mg/hr (s/p VT arrest earlier in admission); HR paced at 89, BP soft this AM. Hepatobil: AST trending back down (51); T Bili down to 2.2; ALT wnl. TG 78. Neuro: oriented to person only, complains of abdominal pain ID: WBC up 13.4, afebrile on Zosyn 5/2>>present.   TPN Access: CVC triple lumen placed 4/30 TPN start date: 5/4 Nutritional Goals (per RD recommendation on 5/4, re-estimated post extubation): Kcal: 2000-2200 kcal Protein:   100-120 grams Fluid:  >/= 2 L/day   Goal TPN rate is 80 ml/hr (provides 120 g of protein, 270 g of dextrose, and 60 g of lipids which provides 2000 kCals per day, meeting 100% of patient needs)  Current Nutrition:  Clear liquids TPN at 55 mL/hr   Plan:  Continue TPN at 55 mL/hr per Dr. Algis Liming as adding Bicarb infusion for today. Hopefully can go to goal tomorrow as would benefit from full nutrition.   This TPN provides  82.5g of protein, 186g of dextrose, and 41.1g of lipids which provides 1374 kCals per day, meeting 69% of patient's protein and calorie needs. Electrolytes in TPN: Per discussion with Dr. Algis Liming, ok to add back lytes in TPN cautiously. Maximize acetate. Add MVI to TPN - trace elements are on back order, will only place in TPN on Monday, Wednesday and Friday Continue sensitive SSI and adjust as needed Monitor TPN labs Follow-up renal function and diuresis  Per Discussion with Dr. Algis Liming (who ok'd w/ surgery), will order as follows: KPhos 15 mmol IV x1 KCl 74mEq IV x3 Magnesium 2g IV x1 --Will recheck BMET, Mag, and Phos this afternoon   Sloan Leiter, PharmD, BCPS, BCCCP Clinical Pharmacist Please refer to [facility] for [place] numbers 08/28/2018, 7:07 AM

## 2018-08-28 NOTE — Progress Notes (Signed)
Pt w/ incontinent medium amnt of stool w/ some small blood clots and bright red streaks. PA at bedside and saw BM. Will continue to monitor.  Clyde Canterbury, RN

## 2018-08-28 NOTE — Progress Notes (Addendum)
PROGRESS NOTE    [name]  PYP:[id number] DOB: [birth year] DOA: 08/13/2018 PCP: [name], MD  Brief Narrative: PCCM Pick up 5/5 Is a chronically ill 78 year old female with history of chronic systolic CHF, nonsustained V. tach, pericardial effusion, CAD, AICD, chronic diastolic CHF, chronic kidney disease stage III, hypertension, rheumatoid arthritis, anemia of chronic disease was admitted to [facility] on 4/22 with small bowel obstruction, also started on antibiotics for possible pneumonia -Failed conservative management developed high-grade small bowel obstruction by 4/28 -4/29 transferred to [facility] -4/30 went to the OR underwent ex lap with lysis of adhesion, bowel resection for ischemic bowel, returned to the ICU on ventilator, hypotensive -4/30 shocked for VT, started on pressors -5/1: Developed progressive renal failure oliguria -5/2: Off pressors -5/3: Extubated General surgery following, currently on TNA, awaiting return of bowel function 5/5: Transferred to [place]:   Septic shock -Resolved -Status post VDRF and pressor support, extubated 5/3, weaned off pressors 5/2. Stable  Small bowel obstruction, ischemic colon/postop ileus -Failed conservative management of bowel obstruction at [facility] -On 4/30 underwent ex lap for SBO and small bowel resection of ischemic necrotic bowel -On IV Zosyn since 4/30 -On TNA awaiting return of bowel function.  NG tube discontinued. -Per general surgery -Advancing to dysphagia 1 diet as tolerated as per speech therapy recommendations. -After my visit with patient, patient reportedly had 2 bloody BMs.  Discussed with RN.  First BM was medium sized, bright red blood without clots.  Second BM was small, some bright red blood and few small clots. -Wound VAC has been removed 5/6.  Wound is currently open and fascia intact. -DC Foley 5/7  Acute lower GI bleed -Could be related to recent bowel  surgery. -DC subcutaneous heparin.  Has SCDs for DVT prophylaxis. -Monitor closely.  If worsens may need further evaluation.  CCS aware.  Acute kidney injury on CKD stage II -Baseline creatinine around 1.2 -Creatinine worsened to 3.2, in the setting of septic shock, ATN, ischemic bowel -Creatinine improved.  Creatinine has plateaued in the 2.4-2.5 range for the last 3 days.  Hypokalemia improved. -Continue TNA, renal dosing medications.   -Improving.  Monitor BMP closely.  Hypophosphatemia/hypomagnesemia Replace IV and follow.  NAG metabolic acidosis Possibly due to some diarrhea and mild hyperchloremia.  Brief IV bicarbonate drip.  Follow BMP in a.m.  Chronic systolic CHF, AICD -EF of 30% with by V ICD -Off diuretics, ACE inhibitor in the setting of AKI and septic shock -Cardiology on board and awaiting to start oral medications when she is able to tolerate diet adequately. -Clinically not volume overloaded.  Monitor closely while on TPN and brief IV bicarbonate drip.  Acute on chronic anemia -Acute on chronic -Hemoglobin stable in the 8 g range -Check anemia panel. -Now that she is having some rectal bleeding, follow CBCs closely and transfuse if hemoglobin 7 or less.  Paroxysmal atrial fibrillation -Status post DC cardioversion on 4/29, currently in sinus rhythm -On IV amiodarone, cardiology following.  She is AV pacing.  Transition to oral amiodarone when tolerating oral well.  No anticoagulation currently due to GI bleed issues.  Severe protein calorie malnutrition -Advancing to dysphagia 1 diet.  History of CAD -Stable, restart beta-blockers when able to tolerate oral adequately.  Thrombocytopenia Stable in the 100 range mostly.  DVT prophylaxis: Heparin subcutaneous Code Status: Full code Family Communication: I discussed in detail with patient's spouse via phone, updated care and answered questions.  He  expressed hope that patient would not require any further  surgery. Disposition Plan: Patient not yet medically ready for discharge.  Will likely need SNF for rehab when stable for discharge.  Consultants:   Cardiology  PCCM  Surgery     Procedures:   -DC cardioversion 4/29  -4/30 :Ex lap with lysis of adhesion, bowel resection for ischemic bowel,  Patient has right IJ, PPM, Foley catheter (discontinued 5/7) and abdominal wound VAC (discontinued 5/6).  Antimicrobials:    Subjective: Extremely hard of hearing.  Does not have hearing aid.  Due to this unable to obtain adequate history from patient.  Seems oriented to person and place.  As per RN at bedside, patient had up to 5-6 slimy BMs yesterday, some abdominal pain reported this morning.  Tolerating small amount of liquid diet.  Later this morning had 2 bloody BMs described above.  Objective: Vitals:   08/28/18 0753 08/28/18 0900 08/28/18 0914 08/28/18 1132  BP: (!) 111/47  (!) 110/55 (!) 126/50  Pulse: 78  79 80  Resp: (!) 22 13 (!) 23 15  Temp: 98.6 F (37 C)  97.9 F (36.6 C) 98.3 F (36.8 C)  TempSrc: Oral  Oral Oral  SpO2: 100%  100% 98%  Weight:      Height:        Intake/Output Summary (Last 24 hours) at 08/28/2018 1410 Last data filed at 08/28/2018 1300 Gross per 24 hour  Intake 4999.11 ml  Output 550 ml  Net 4449.11 ml   Filed Weights   08/22/18 0625 08/24/18 0500 08/28/18 0422  Weight: 70.3 kg 72.8 kg 71.2 kg    Examination:  General exam: Early, frail chronically ill female, very hard of hearing lying comfortably propped up in bed.  Does not appear in any distress. Respiratory system: Clear to auscultation.  No increased work of breathing. Cardiovascular system: S1 & S2 heard, RRR.  No JVD or murmurs.  No pedal edema.  Telemetry personally reviewed: AV or V paced rhythm.  Gastrointestinal system: Abdomen is soft, nondistended, appropriate postop tenderness without acute abdomen, surgical site dressing clean and dry.  Sluggish bowel sounds heard. Central  nervous system: Alert and oriented x2.  No focal deficits. Extremities: right IJ PICC line.  Right forearm dressing clean and dry. Skin: No rashes, lesions or ulcers Psychiatry:  Mood & affect appropriate.     Data Reviewed:   CBC: Recent Labs  Lab 08/23/18 0354 08/24/18 0444 08/25/18 0415 08/26/18 0500 08/28/18 0458  WBC 31.8* 25.4* 13.8* 10.2 13.4*  NEUTROABS  --   --   --  8.5*  --   HGB 9.0* 8.2* 7.8* 8.3* 8.0*  HCT 28.0* 25.1* 23.5* 24.1* 22.8*  MCV 85.4 82.8 80.5 77.0* 77.6*  PLT 116* 102* 100* 92* 097*   Basic Metabolic Panel: Recent Labs  Lab 08/23/18 0354  08/25/18 0415 08/26/18 0500 08/27/18 0513 08/27/18 1700 08/28/18 0458  NA 148*   < > 148* 147* 143 140 139  K 4.7   < > 3.7 2.6* 2.8* 3.5 3.7  CL 119*   < > 120* 115* 112* 112* 115*  CO2 17*   < > 17* 19* 17* 18* 15*  GLUCOSE 124*   < > 65* 131* 135* 151* 128*  BUN 57*   < > 65* 63* 61* 64* 68*  CREATININE 2.92*   < > 3.19* 3.02* 2.56* 2.46* 2.50*  CALCIUM 7.4*   < > 7.4* 7.6* 7.8* 8.0* 7.8*  MG 1.9  --   --  2.2 1.7  --  1.6*  PHOS  --   --   --  3.4 2.2*  --  2.4*   < > = values in this interval not displayed.   GFR: Estimated Creatinine Clearance: 17.7 mL/min (A) (by C-G formula based on SCr of 2.5 mg/dL (H)). Liver Function Tests: Recent Labs  Lab 08/24/18 0444 08/26/18 0500 08/27/18 0513 08/28/18 0458  AST 55* 60* 54* 51*  ALT 10 13 15 18   ALKPHOS 201* 226* 220* 223*  BILITOT 1.8* 2.1* 2.0* 2.2*  PROT 4.6* 4.9* 4.6* 4.5*  ALBUMIN 1.3* 1.2* 1.1* 1.1*   Cardiac Enzymes: Recent Labs  Lab 08/21/18 1925 08/22/18 0401  TROPONINI 0.08* 0.11*   CBG: Recent Labs  Lab 08/27/18 2354 08/28/18 0421 08/28/18 0603 08/28/18 0816 08/28/18 1126  GLUCAP 128* 105* 117* 128* 127*   Lipid Profile: Recent Labs    08/26/18 0500  TRIG 78   Urine analysis:    Component Value Date/Time   COLORURINE AMBER (A) 08/13/2018 1849   APPEARANCEUR CLOUDY (A) 08/13/2018 1849   LABSPEC 1.016  08/13/2018 1849   PHURINE 5.0 08/13/2018 1849   GLUCOSEU NEGATIVE 08/13/2018 1849   HGBUR MODERATE (A) 08/13/2018 1849   BILIRUBINUR NEGATIVE 08/13/2018 1849   KETONESUR NEGATIVE 08/13/2018 1849   PROTEINUR 30 (A) 08/13/2018 1849   UROBILINOGEN 0.2 07/17/2013 2240   NITRITE NEGATIVE 08/13/2018 1849   LEUKOCYTESUR TRACE (A) 08/13/2018 1849    Recent Results (from the past 240 hour(s))  Surgical pcr screen     Status: Abnormal   Collection Time: 08/20/18 11:50 PM  Result Value Ref Range Status   MRSA, PCR POSITIVE (A) NEGATIVE Final    Comment: RESULT CALLED TO, READ BACK BY AND VERIFIED WITH: I BATOON RN 08/21/18 0110 JDW    Staphylococcus aureus POSITIVE (A) NEGATIVE Final    Comment: (NOTE) The Xpert SA Assay (FDA approved for NASAL specimens in patients 52 years of age and older), is one component of a comprehensive surveillance program. It is not intended to diagnose infection nor to guide or monitor treatment. Performed at [facility], [place] [address], [place], [place] [address]          Radiology Studies: No results found.      Scheduled Meds: . chlorhexidine  15 mL Mouth Rinse BID  . feeding supplement (ENSURE ENLIVE)  237 mL Oral TID BM  . heparin injection (subcutaneous)  5,000 Units Subcutaneous Q8H  . insulin aspart  0-9 Units Subcutaneous Q4H  . mouth rinse  15 mL Mouth Rinse q12n4p  . mupirocin ointment   Nasal BID  . pantoprazole (PROTONIX) IV  40 mg Intravenous QHS   Continuous Infusions: . amiodarone 30 mg/hr (08/28/18 1300)  . dextrose 5 % 1,000 mL with sodium bicarbonate 150 mEq infusion    . piperacillin-tazobactam (ZOSYN)  IV Stopped (08/28/18 0552)  . potassium PHOSPHATE IVPB (in mmol) 43 mL/hr at 08/28/18 1300  . TPN ADULT (ION) 55 mL/hr at 08/28/18 1300  . TPN ADULT (ION)       LOS: 15 days    Vernell Leep, MD, FACP, [facility]. [facility]  To contact the attending provider between 7A-7P or the covering provider during  after hours 7P-7A, please log into the web site [email or web address] and access using universal [place] password for that web site. If you do not have the password, please call the hospital operator.

## 2018-08-28 NOTE — Progress Notes (Signed)
  Speech Language Pathology Treatment: Dysphagia  Patient Details Name: [name] MRN: [id number] DOB: 1940/10/15 Today's Date: 08/28/2018 Time: 1450-1510 SLP Time Calculation (min) (ACUTE ONLY): 20 min  Assessment / Plan / Recommendation Clinical Impression  Patient seen to address dysphagia goals with thin liquids(Ensure shake). Patient consumed 8 ounces of liquids via straw sips without any overt s/s of aspiration or penetration and with vocal quality remaining clear. Patient did present with some increased WOB but this was mild overall and diminished with brief rest breaks between PO intake. Patient's swallow function appears to be improving, with less frequent cough/throat clear yesterday and no instances of coughing or throat clearing today.    HPI HPI: Patient is a 78 y.o. female with PMH: chronic CHF, CAD with remote MI, HTN, hyperlipidemia, CKD stage III, chronic anemia, GERD, who underwent ex-lap for SBO on 4/30. She was transferred to ICU post-op for severe hypotension and shock, was intubated from 4/30 to 5/3.      SLP Plan  Continue with current plan of care       Recommendations  Diet recommendations: Thin liquid;Dysphagia 1 (puree) Liquids provided via: Cup;Straw Medication Administration: Crushed with puree Supervision: Full supervision/cueing for compensatory strategies;Staff to assist with self feeding;Trained caregiver to feed patient Compensations: Slow rate;Small sips/bites;Minimize environmental distractions Postural Changes and/or Swallow Maneuvers: Seated upright 90 degrees;Upright 30-60 min after meal                Oral Care Recommendations: Oral care BID Follow up Recommendations: Skilled Nursing facility;24 hour supervision/assistance SLP Visit Diagnosis: Dysphagia, unspecified (R13.10) Plan: Continue with current plan of care       GO                [name] 08/28/2018, 3:52 PM    [name], MA, [facility] Pager: [phone]

## 2018-08-29 LAB — GLUCOSE, CAPILLARY
Glucose-Capillary: 106 mg/dL — ABNORMAL HIGH (ref 70–99)
Glucose-Capillary: 117 mg/dL — ABNORMAL HIGH (ref 70–99)
Glucose-Capillary: 119 mg/dL — ABNORMAL HIGH (ref 70–99)
Glucose-Capillary: 123 mg/dL — ABNORMAL HIGH (ref 70–99)
Glucose-Capillary: 135 mg/dL — ABNORMAL HIGH (ref 70–99)
Glucose-Capillary: 138 mg/dL — ABNORMAL HIGH (ref 70–99)
Glucose-Capillary: 192 mg/dL — ABNORMAL HIGH (ref 70–99)

## 2018-08-29 LAB — RETICULOCYTES
Immature Retic Fract: 31.2 % — ABNORMAL HIGH (ref 2.3–15.9)
RBC.: 2.8 MIL/uL — ABNORMAL LOW (ref 3.87–5.11)
Retic Count, Absolute: 84.3 10*3/uL (ref 19.0–186.0)
Retic Ct Pct: 3 % (ref 0.4–3.1)

## 2018-08-29 LAB — CBC
HCT: 21.3 % — ABNORMAL LOW (ref 36.0–46.0)
Hemoglobin: 7.5 g/dL — ABNORMAL LOW (ref 12.0–15.0)
MCH: 27.3 pg (ref 26.0–34.0)
MCHC: 35.2 g/dL (ref 30.0–36.0)
MCV: 77.5 fL — ABNORMAL LOW (ref 80.0–100.0)
Platelets: 108 10*3/uL — ABNORMAL LOW (ref 150–400)
RBC: 2.75 MIL/uL — ABNORMAL LOW (ref 3.87–5.11)
RDW: 21.9 % — ABNORMAL HIGH (ref 11.5–15.5)
WBC: 11.2 10*3/uL — ABNORMAL HIGH (ref 4.0–10.5)
nRBC: 0 % (ref 0.0–0.2)

## 2018-08-29 LAB — COMPREHENSIVE METABOLIC PANEL
ALT: 19 U/L (ref 0–44)
AST: 40 U/L (ref 15–41)
Albumin: 1.1 g/dL — ABNORMAL LOW (ref 3.5–5.0)
Alkaline Phosphatase: 204 U/L — ABNORMAL HIGH (ref 38–126)
Anion gap: 10 (ref 5–15)
BUN: 71 mg/dL — ABNORMAL HIGH (ref 8–23)
CO2: 15 mmol/L — ABNORMAL LOW (ref 22–32)
Calcium: 8 mg/dL — ABNORMAL LOW (ref 8.9–10.3)
Chloride: 115 mmol/L — ABNORMAL HIGH (ref 98–111)
Creatinine, Ser: 2.53 mg/dL — ABNORMAL HIGH (ref 0.44–1.00)
GFR calc Af Amer: 20 mL/min — ABNORMAL LOW (ref >60)
GFR calc non Af Amer: 17 mL/min — ABNORMAL LOW (ref >60)
Glucose, Bld: 123 mg/dL — ABNORMAL HIGH (ref 70–99)
Potassium: 4.2 mmol/L (ref 3.5–5.1)
Sodium: 140 mmol/L (ref 135–145)
Total Bilirubin: 2.3 mg/dL — ABNORMAL HIGH (ref 0.3–1.2)
Total Protein: 4.7 g/dL — ABNORMAL LOW (ref 6.5–8.1)

## 2018-08-29 LAB — FOLATE: Folate: 15.1 ng/mL (ref >5.9)

## 2018-08-29 LAB — IRON AND TIBC: Iron: 30 ug/dL (ref 28–170)

## 2018-08-29 LAB — VITAMIN B12: Vitamin B-12: 3342 pg/mL — ABNORMAL HIGH (ref 180–914)

## 2018-08-29 LAB — FERRITIN: Ferritin: 930 ng/mL — ABNORMAL HIGH (ref 11–307)

## 2018-08-29 MED ORDER — SODIUM BICARBONATE 650 MG PO TABS
1300.0000 mg | ORAL_TABLET | Freq: Two times a day (BID) | ORAL | Status: DC
  Administered 2018-08-29 – 2018-08-31 (×6): 1300 mg via ORAL
  Filled 2018-08-29 (×6): qty 2

## 2018-08-29 MED ORDER — SODIUM BICARBONATE 8.4 % IV SOLN
INTRAVENOUS | Status: DC
  Filled 2018-08-29: qty 1000

## 2018-08-29 MED ORDER — SODIUM BICARBONATE 8.4 % IV SOLN
INTRAVENOUS | Status: DC
  Administered 2018-08-29: 12:00:00 via INTRAVENOUS
  Filled 2018-08-29: qty 150

## 2018-08-29 MED ORDER — TRAVASOL 10 % IV SOLN
INTRAVENOUS | Status: AC
  Administered 2018-08-29: 17:00:00 via INTRAVENOUS
  Filled 2018-08-29: qty 825

## 2018-08-29 NOTE — Progress Notes (Addendum)
PROGRESS NOTE    [name]  EHM:[id number] DOB: 20-Jun-1940 DOA: 08/13/2018 PCP: [name], MD  Brief Narrative: PCCM Pick up 5/5 Is a chronically ill 78 year old female with history of chronic systolic CHF, nonsustained V. tach, pericardial effusion, CAD, AICD, chronic diastolic CHF, chronic kidney disease stage III, hypertension, rheumatoid arthritis, anemia of chronic disease was admitted to [facility] on 4/22 with small bowel obstruction, also started on antibiotics for possible pneumonia -Failed conservative management developed high-grade small bowel obstruction by 4/28 -4/29 transferred to [facility] -4/30 went to the OR underwent ex lap with lysis of adhesion, bowel resection for ischemic bowel, returned to the ICU on ventilator, hypotensive -4/30 shocked for VT, started on pressors -5/1: Developed progressive renal failure oliguria -5/2: Off pressors -5/3: Extubated General surgery following, currently on TNA, awaiting return of bowel function 5/5: Transferred to [name]:   Septic shock -Resolved -Status post VDRF and pressor support, extubated 5/3, weaned off pressors 5/2. Stable  Small bowel obstruction, ischemic colon/postop ileus -Failed conservative management of bowel obstruction at [facility] -On 4/30 underwent ex lap for SBO and small bowel resection of ischemic necrotic bowel -On IV Zosyn since 4/30, total 10 days per CCS. -On TNA awaiting return of bowel function.  NG tube discontinued. -Per general surgery -Wound VAC has been removed 5/6.  Wound is currently open and fascia intact. -DC Foley 5/7 -Tolerating dysphagia 1 diet recommended by speech therapy. -Had 2 bloody BMs on 5/7 but none since. -Had multiple BMs/diarrhea up to 8 on 5/7, Flexi-Seal placed but since then diarrhea seems to have improved and no stool in Flexi-Seal this morning. -CCS follow-up appreciated and feel that bloody BM was likely from staple line.   Plan to DC JP drain if output decreases.  Acute lower GI bleed -As per general surgery, felt to be due to staple line -DC subcutaneous heparin.  Has SCDs for DVT prophylaxis. -Seems to have resolved.  Continue to monitor closely.  Acute kidney injury on CKD stage II -Baseline creatinine around 1.2 -Creatinine worsened to 3.2, in the setting of septic shock, ATN, ischemic bowel -Creatinine improved.  Creatinine has plateaued in the 2.4-2.5 range for the last 3 days.  Hypokalemia resolved -Continue TNA, renal dosing medications.   -Improving.  Monitor BMP closely.  Hypophosphatemia/hypomagnesemia Replaced  NAG metabolic acidosis Likely due to diarrhea and mild hyperchloremia.  When I had seen her this morning, patient was not wheezing, had increased IV bicarbonate but since then CCS noted that patient was wheezing somewhat.  Therefore discontinued IV bicarbonate drip and continue oral bicarbonate supplements.  Hopefully with diarrhea improving, this should improve as well.  Chronic systolic CHF, AICD -EF of 30% with by V ICD -Off diuretics, ACE inhibitor in the setting of AKI and septic shock -Cardiology on board and awaiting to start oral medications when she is able to tolerate diet adequately. -Volume status looks okay but CCS noted some wheezing (as per RN, occasional and seems upper airway wheezing).  Discontinued IV fluids.  Continue to hold diuretics but may have to restart if dyspnea/wheezing worsen.  Acute on chronic anemia -Acute on chronic -Hemoglobin has drifted down from 8 g to 7.5 g. -Check anemia panel. -Follow CBC closely and transfuse if hemoglobin 7 or less.  Paroxysmal atrial fibrillation -Status post DC cardioversion on 4/29, currently in sinus rhythm -On IV amiodarone, cardiology following.  She is AV pacing.   - No anticoagulation currently due to GI  bleed issues. -Cardiology switched her from IV to oral amiodarone on 5/7.  I discussed with Dr. Katharina Caper prior to  sign off on 5/7, continue amiodarone, low-dose carvedilol initiated, may restart Lasix when able. -Outpatient follow-up with Dr. [name].  Severe protein calorie malnutrition -Advancing to dysphagia 1 diet.  History of CAD -Carvedilol 3.125 mg twice daily started 5/7.  Thrombocytopenia Stable in the 100 range mostly.  DVT prophylaxis: SCDs Code Status: Full code Family Communication: I called spouse, updated care and answered questions. Disposition Plan: Patient not yet medically ready for discharge.  Will likely need SNF for rehab when stable for discharge.  Consultants:   Cardiology  PCCM  Surgery     Procedures:   -DC cardioversion 4/29  -4/30 :Ex lap with lysis of adhesion, bowel resection for ischemic bowel,  Patient has right IJ, PPM, Foley catheter (discontinued 5/7) and abdominal wound VAC (discontinued 5/6).  Antimicrobials:    Subjective: Hard of hearing and ongoing difficulty with history taking due to that.  Indicates that she feels okay.  No pain reported.  As per RN, had multiple BMs/diarrhea yesterday/last night and had Flexi-Seal placed but since then diarrhea has improved and no stool noted in Flexi-Seal this morning.  Also no further blood in stools.  RN stated that since this morning patient has been up in the chair, occasional upper airway wheezing but no dyspnea.  Objective: Vitals:   08/29/18 0700 08/29/18 0800 08/29/18 0838 08/29/18 0900  BP:  93/67 (!) 110/46   Pulse: 73  74 73  Resp: (!) 21   (!) 22  Temp:  (!) 97.5 F (36.4 C)    TempSrc:  Oral    SpO2: 97%   97%  Weight:      Height:        Intake/Output Summary (Last 24 hours) at 08/29/2018 1151 Last data filed at 08/29/2018 0800 Gross per 24 hour  Intake 3999.29 ml  Output 835 ml  Net 3164.29 ml   Filed Weights   08/22/18 0625 08/24/18 0500 08/28/18 0422  Weight: 70.3 kg 72.8 kg 71.2 kg    Examination:  General exam: Early, frail chronically ill female, very hard of  hearing lying comfortably propped up in bed.  Does not appear in any distress. Respiratory system: Clear to auscultation without wheezing, rhonchi or crackles.  No increased work of breathing. Cardiovascular system: S1 & S2 heard, RRR.  No JVD or murmurs.  No pedal edema.  Telemetry personally reviewed: Paced rhythm.  Gastrointestinal system: Abdomen is soft, nondistended, appropriate postop minimal tenderness without acute abdomen, surgical site dressing clean and dry.  Bowel sounds sluggish but seem to be improving. Central nervous system: Alert and oriented x2.  No focal deficits. Extremities: right IJ PICC line.  Right forearm dressing clean and dry. Skin: No rashes, lesions or ulcers Psychiatry:  Mood & affect appropriate.     Data Reviewed:   CBC: Recent Labs  Lab 08/25/18 0415 08/26/18 0500 08/28/18 0458 08/28/18 1500 08/29/18 0240  WBC 13.8* 10.2 13.4* 12.1* 11.2*  NEUTROABS  --  8.5*  --   --   --   HGB 7.8* 8.3* 8.0* 7.8* 7.5*  HCT 23.5* 24.1* 22.8* 23.1* 21.3*  MCV 80.5 77.0* 77.6* 79.4* 77.5*  PLT 100* 92* 102* 104* 093*   Basic Metabolic Panel: Recent Labs  Lab 08/23/18 0354  08/26/18 0500 08/27/18 0513 08/27/18 1700 08/28/18 0458 08/28/18 1500 08/29/18 0240  NA 148*   < > 147* 143 140 139 139  140  K 4.7   < > 2.6* 2.8* 3.5 3.7 3.6 4.2  CL 119*   < > 115* 112* 112* 115* 115* 115*  CO2 17*   < > 19* 17* 18* 15* 14* 15*  GLUCOSE 124*   < > 131* 135* 151* 128* 151* 123*  BUN 57*   < > 63* 61* 64* 68* 68* 71*  CREATININE 2.92*   < > 3.02* 2.56* 2.46* 2.50* 2.51* 2.53*  CALCIUM 7.4*   < > 7.6* 7.8* 8.0* 7.8* 7.7* 8.0*  MG 1.9  --  2.2 1.7  --  1.6* 2.0  --   PHOS  --   --  3.4 2.2*  --  2.4* 2.5  --    < > = values in this interval not displayed.   GFR: Estimated Creatinine Clearance: 17.4 mL/min (A) (by C-G formula based on SCr of 2.53 mg/dL (H)). Liver Function Tests: Recent Labs  Lab 08/24/18 0444 08/26/18 0500 08/27/18 0513 08/28/18 0458 08/29/18  0240  AST 55* 60* 54* 51* 40  ALT 10 13 15 18 19   ALKPHOS 201* 226* 220* 223* 204*  BILITOT 1.8* 2.1* 2.0* 2.2* 2.3*  PROT 4.6* 4.9* 4.6* 4.5* 4.7*  ALBUMIN 1.3* 1.2* 1.1* 1.1* 1.1*   Cardiac Enzymes: No results for input(s): CKTOTAL, CKMB, CKMBINDEX, TROPONINI in the last 168 hours. CBG: Recent Labs  Lab 08/28/18 2012 08/29/18 0014 08/29/18 0523 08/29/18 0811 08/29/18 1130  GLUCAP 138* 123* 135* 119* 192*   Lipid Profile: No results for input(s): CHOL, HDL, LDLCALC, TRIG, CHOLHDL, LDLDIRECT in the last 72 hours. Urine analysis:    Component Value Date/Time   COLORURINE AMBER (A) 08/13/2018 1849   APPEARANCEUR CLOUDY (A) 08/13/2018 1849   LABSPEC 1.016 08/13/2018 1849   PHURINE 5.0 08/13/2018 1849   GLUCOSEU NEGATIVE 08/13/2018 1849   HGBUR MODERATE (A) 08/13/2018 1849   BILIRUBINUR NEGATIVE 08/13/2018 1849   KETONESUR NEGATIVE 08/13/2018 1849   PROTEINUR 30 (A) 08/13/2018 1849   UROBILINOGEN 0.2 07/17/2013 2240   NITRITE NEGATIVE 08/13/2018 1849   LEUKOCYTESUR TRACE (A) 08/13/2018 1849    Recent Results (from the past 240 hour(s))  Surgical pcr screen     Status: Abnormal   Collection Time: 08/20/18 11:50 PM  Result Value Ref Range Status   MRSA, PCR POSITIVE (A) NEGATIVE Final    Comment: RESULT CALLED TO, READ BACK BY AND VERIFIED WITH: I BATOON RN 08/21/18 0110 JDW    Staphylococcus aureus POSITIVE (A) NEGATIVE Final    Comment: (NOTE) The Xpert SA Assay (FDA approved for NASAL specimens in patients 22 years of age and older), is one component of a comprehensive surveillance program. It is not intended to diagnose infection nor to guide or monitor treatment. Performed at [facility], [place] [address] [place],  [address]          Radiology Studies: No results found.      Scheduled Meds: . amiodarone  200 mg Oral Daily  . carvedilol  3.125 mg Oral BID WC  . chlorhexidine  15 mL Mouth Rinse BID  . feeding supplement (ENSURE  ENLIVE)  237 mL Oral TID BM  . insulin aspart  0-9 Units Subcutaneous Q4H  . mouth rinse  15 mL Mouth Rinse q12n4p  . mupirocin ointment   Nasal BID  . pantoprazole (PROTONIX) IV  40 mg Intravenous QHS  . sodium bicarbonate  1,300 mg Oral BID   Continuous Infusions: . piperacillin-tazobactam (ZOSYN)  IV Stopped (08/29/18  8118)  .  sodium bicarbonate  infusion 1000 mL 70 mL/hr at 08/29/18 1138  . TPN ADULT (ION) 55 mL/hr at 08/29/18 0700  . TPN ADULT (ION)       LOS: 16 days    Vernell Leep, MD, FACP, [facility]. [facility]  To contact the attending provider between 7A-7P or the covering provider during after hours 7P-7A, please log into the web site [email or web address] and access using universal [place] password for that web site. If you do not have the password, please call the hospital operator.

## 2018-08-29 NOTE — Progress Notes (Signed)
8 Days Post-Op  Subjective: Denies abdominal pain, N/V. Tolerating diet. Did have a second episode of bloody BM yesterday. Blood thinner was held by hospitalist. Several episodes of BM yesterday (8 n- nurse reports becoming more solid, like a paste this AM). Has some wheezing this morning.   Objective: Vital signs in last 24 hours: Temp:  [97.5 F (36.4 C)-98.3 F (36.8 C)] 97.5 F (36.4 C) (05/08 0800) Pulse Rate:  [29-81] 73 (05/08 0900) Resp:  [15-28] 22 (05/08 0900) BP: (93-126)/(45-67) 110/46 (05/08 0838) SpO2:  [96 %-100 %] 97 % (05/08 0900) Last BM Date: 08/28/18  Intake/Output from previous day: 05/07 0701 - 05/08 0700 In: 4591.8 [P.O.:1610; I.V.:2282.4; IV Piggyback:699.4] Out: 835 [Urine:750; Drains:85] Intake/Output this shift: Total I/O In: 140 [P.O.:140] Out: -   PE: Gen: Awake and alert, NAD.  Heart: Regular rate Lungs:OffO2.Wheezing upper b/l. Audible. Decreased breath sounds at bases. Abd: Soft,nodistension, normoactiveBS, appropriately tender without peritonitis. Midline wound with more granulation tissue forming at the base. Some healthy friable tissue at the upper portion of wound. No wound dehiscence, or signs of infection. Please see picture below.JP drain in place.85cc output overnight, bloody serosanguinous fluid. Msk: Trace edema      Lab Results:  Recent Labs    08/28/18 1500 08/29/18 0240  WBC 12.1* 11.2*  HGB 7.8* 7.5*  HCT 23.1* 21.3*  PLT 104* 108*   BMET Recent Labs    08/28/18 1500 08/29/18 0240  NA 139 140  K 3.6 4.2  CL 115* 115*  CO2 14* 15*  GLUCOSE 151* 123*  BUN 68* 71*  CREATININE 2.51* 2.53*  CALCIUM 7.7* 8.0*   PT/INR No results for input(s): LABPROT, INR in the last 72 hours. CMP     Component Value Date/Time   NA 140 08/29/2018 0240   K 4.2 08/29/2018 0240   CL 115 (H) 08/29/2018 0240   CO2 15 (L) 08/29/2018 0240   GLUCOSE 123 (H) 08/29/2018 0240   BUN 71 (H) 08/29/2018 0240   CREATININE  2.53 (H) 08/29/2018 0240   CREATININE 1.07 (H) 02/13/2018 1142   CALCIUM 8.0 (L) 08/29/2018 0240   PROT 4.7 (L) 08/29/2018 0240   ALBUMIN 1.1 (L) 08/29/2018 0240   AST 40 08/29/2018 0240   ALT 19 08/29/2018 0240   ALKPHOS 204 (H) 08/29/2018 0240   BILITOT 2.3 (H) 08/29/2018 0240   GFRNONAA 17 (L) 08/29/2018 0240   GFRNONAA 49 (L) 02/13/2018 1142   GFRAA 20 (L) 08/29/2018 0240   GFRAA 57 (L) 02/13/2018 1142   Lipase     Component Value Date/Time   LIPASE 30 08/13/2018 1926       Studies/Results: No results found.  Anti-infectives: Anti-infectives (From admission, onward)   Start     Dose/Rate Route Frequency Ordered Stop   08/23/18 1400  piperacillin-tazobactam (ZOSYN) IVPB 2.25 g     2.25 g 100 mL/hr over 30 Minutes Intravenous Every 8 hours 08/23/18 0928     08/21/18 1200  piperacillin-tazobactam (ZOSYN) IVPB 3.375 g  Status:  Discontinued     3.375 g 12.5 mL/hr over 240 Minutes Intravenous Every 8 hours 08/21/18 1102 08/23/18 0928   08/14/18 1000  hydroxychloroquine (PLAQUENIL) tablet 200 mg  Status:  Discontinued    Note to Pharmacy:  Take 1 tablet by mouth daily Monday through Friday only     200 mg Oral Once per day on Mon Tue Wed Thu Fri 08/14/18 0742 08/21/18 1147   08/13/18 2200  doxycycline (VIBRA-TABS) tablet 100  mg  Status:  Discontinued     100 mg Oral Every 12 hours 08/13/18 2134 08/13/18 2135   08/13/18 2200  doxycycline (VIBRAMYCIN) 100 mg in sodium chloride 0.9 % 250 mL IVPB  Status:  Discontinued     100 mg 125 mL/hr over 120 Minutes Intravenous Every 12 hours 08/13/18 2135 08/19/18 1702   08/13/18 2145  cefTRIAXone (ROCEPHIN) 1 g in sodium chloride 0.9 % 100 mL IVPB  Status:  Discontinued     1 g 200 mL/hr over 30 Minutes Intravenous Every 24 hours 08/13/18 2134 08/14/18 0743       Assessment/Plan  POD8, s/p ex lapw/SBR, LOA, appendectomyfor SBO&ischemic/necroticbowel, 4/30 - PT -Extubated.Offpressors.Out of unit.AppreciateTRHhelp in  her care -Cont TPN till inc intake. Pre-alb <5.Hopefully can be weaned soon. DYS1 diet and as tolerated per speech - Bloody BM yesterday. Heparin held. Hgb down from 8.0>7.8>7.5. Likely from staple line. Agree with holding blood thinner. Monitor. Repeat labs in AM. If Hgb <7 or sympomatic, transfuse PRN.   - LFTsstable. ?Elevation from TPN. Monitor. - BID WTD dressing changes. - cont JP drain for now. Possibly d/c this weekend if output decreases again.  - cont zosyn (10 days total) - Path benign. 58cm SB w/ hemorrhage and prominent necrosis w/ viable margins.Appendix w/ fibrous obliteration and patchy acute serositis. - Mobilize and IS  Acute on chronic renal failure -UO(736mL/24 hours). Cr stable up this AM 3.21>3.19>3.02>2.56>2.46>2.5>2.51>2.53 -Defer fluid balance to TRH. Foley out  ICD/CAD/CHF -per cardiology.  -on amio  Acute Respiratory Failure - Off vent.Improving. Per TRH  ABL Anemia  -  As above   FEN -TPN,DYS1 (AAT per speech),K 4.2 & Mg 2.0 (being replaced) VTE - SCDs, Mobilize as tolerating with PT. ID -zosyn 4/30 (10 days total) >> WBCdowntrending 47.6>31.8>25.4>13.8>10.2>13.4>12.1>11.2; afebrile Foley - Removed 5/7 Follow up - Dr. Marlou Starks     LOS: 16 days    Jillyn Ledger , [facility] Surgery 08/29/2018, 10:05 AM Pager: [phone]

## 2018-08-29 NOTE — TOC Progression Note (Signed)
Transition of Care [facility]) - Progression Note    Patient Details  Name: [name] MRN: [id number] Date of Birth: Nov 28, 1940  Transition of Care [facility]) CM/SW [place], [place] Phone Number: 08/29/2018, 5:44 PM  Clinical Narrative:    CSW informed MD patient will need Covid-19 testing   Thurmond Butts, MSW, [facility] Clinical Social Worker [phone]    Expected Discharge Plan: [place] Barriers to Discharge: Continued Medical Work up  Expected Discharge Plan and Services Expected Discharge Plan: [place] arrangements for the past 2 months: Single Family Home                                       Social Determinants of Health (SDOH) Interventions    Readmission Risk Interventions Readmission Risk Prevention Plan 08/14/2018  Transportation Screening Complete  Some recent data might be hidden

## 2018-08-29 NOTE — Progress Notes (Signed)
Occupational Therapy Treatment Patient Details Name: [name] MRN: [id number] DOB: 20-Apr-1941 Today's Date: 08/29/2018    History of present illness 78 year old female with significant cardiac history that is post exploratory laparotomy for small bowel obstruction with associated ischemic bowel on 4/30, return to the intensive care on mechanical ventilator, critical care asked to assist with care.  VDRF 4/30-5/3.  Also PNA.  GYB:WLSLHTDSKA heart failure with EF 30%, nonsustained VT, pericardial effusion CAD w/ prior AICD, diastolic heart failure, CKD stage III, hypertension, rheumatoid arthritis, anemia of chronic disease.   OT comments  Pt able to ambulate short distance with 2 person assist and RW. Requires +2 min to max assist for standing as she fatigues. Poor sitting balance today with posterior lean. Pt able to drink with and without lid on cup with B UEs seated in chair. VSS throughout session on RA.   Follow Up Recommendations  SNF;Supervision/Assistance - 24 hour    Equipment Recommendations  Other (comment)(defer to next venue)    Recommendations for Other Services      Precautions / Restrictions Precautions Precautions: Fall Precaution Comments: JP drain, flexiseal       Mobility Bed Mobility Overal bed mobility: Needs Assistance Bed Mobility: Supine to Sit Rolling: Max assist Sidelying to sit: Mod assist       General bed mobility comments: increased time, assist for LEs over EOB, to roll toward L side and to raise trunk  Transfers Overall transfer level: Needs assistance Equipment used: Rolling walker (2 wheeled) Transfers: Sit to/from Stand Sit to Stand: +2 physical assistance;Min assist;Mod assist;Max assist         General transfer comment: requiring increasing amount assist with each trial, from +2 min from elevated bed up to +2 max from chair upon third standing trial    Balance Overall balance assessment: Needs assistance Sitting-balance  support: Feet supported;Bilateral upper extremity supported Sitting balance-Leahy Scale: Poor Sitting balance - Comments: posterior lean   Standing balance support: Bilateral upper extremity supported;During functional activity Standing balance-Leahy Scale: Poor Standing balance comment: reliant on B UE support of walker and external assist for balance                           ADL either performed or assessed with clinical judgement   ADL Overall ADL's : Needs assistance/impaired Eating/Feeding: Set up;Sitting Eating/Feeding Details (indicate cue type and reason): drinking in chair with and without lid, uses both hands                                 Functional mobility during ADLs: +2 for safety/equipment;Minimal assistance;Rolling walker       Vision       Perception     Praxis      Cognition Arousal/Alertness: Awake/alert Behavior During Therapy: Flat affect Overall Cognitive Status: Impaired/Different from baseline Area of Impairment: Orientation;Memory;Following commands;Safety/judgement;Problem solving                 Orientation Level: Place;Situation(knew it was May)   Memory: Decreased short-term memory Following Commands: Follows one step commands with increased time Safety/Judgement: Decreased awareness of safety;Decreased awareness of deficits   Problem Solving: Slow processing;Decreased initiation;Difficulty sequencing;Requires verbal cues;Requires tactile cues General Comments: pt needing encouragement to push herself        Exercises Other Exercises Other Exercises: AAROM B shoulders x 10 seated in chair   Shoulder Instructions  General Comments      Pertinent Vitals/ Pain       Pain Assessment: Faces Faces Pain Scale: Hurts little more Pain Location: back Pain Descriptors / Indicators: Grimacing Pain Intervention(s): Monitored during session;Repositioned  Home Living                                           Prior Functioning/Environment              Frequency  Min 2X/week        Progress Toward Goals  OT Goals(current goals can now be found in the care plan section)  Progress towards OT goals: Progressing toward goals  Acute Rehab OT Goals Patient Stated Goal: get stronger and safe to go home OT Goal Formulation: With family Time For Goal Achievement: 09/08/18 Potential to Achieve Goals: Good  Plan Discharge plan remains appropriate    Co-evaluation    PT/OT/SLP Co-Evaluation/Treatment: Yes Reason for Co-Treatment: Complexity of the patient's impairments (multi-system involvement);For patient/therapist safety   OT goals addressed during session: ADL's and self-care;Strengthening/ROM      AM-PAC OT "6 Clicks" Daily Activity     Outcome Measure   Help from another person eating meals?: A Little Help from another person taking care of personal grooming?: A Lot Help from another person toileting, which includes using toliet, bedpan, or urinal?: Total Help from another person bathing (including washing, rinsing, drying)?: A Lot Help from another person to put on and taking off regular upper body clothing?: A Lot Help from another person to put on and taking off regular lower body clothing?: Total 6 Click Score: 11    End of Session Equipment Utilized During Treatment: Gait belt;Rolling walker  OT Visit Diagnosis: Unsteadiness on feet (R26.81);Other abnormalities of gait and mobility (R26.89);Muscle weakness (generalized) (M62.81);Other symptoms and signs involving cognitive function   Activity Tolerance Patient tolerated treatment well   Patient Left in chair;with call bell/phone within reach;with chair alarm set   Nurse Communication Mobility status(needs purewick replaced)        Time: 2482-5003 OT Time Calculation (min): 30 min  Charges: OT General Charges $OT Visit: 1 Visit OT Treatments $Therapeutic Activity: 8-22 mins  Nestor Lewandowsky, OTR/L Acute Rehabilitation Services Pager: [phone] Office: [phone]   Malka So 08/29/2018, 11:10 AM

## 2018-08-29 NOTE — Progress Notes (Signed)
PHARMACY - ADULT TOTAL PARENTERAL NUTRITION CONSULT NOTE   Pharmacy Consult for TPN Indication: s/p ex lap with SBR for SBO and ischemic/necroticbowel  Patient Measurements: Height: 5\' 4"  (162.6 cm) Weight: 156 lb 15.5 oz (71.2 kg) IBW/kg (Calculated) : 54.7 TPN AdjBW (KG): 64.8 Body mass index is 26.94 kg/m.  Assessment:  78 year old female with PMH significant for anemia, CAD s/p AICD placement, breast cancer, CKD III, CHF, HLD, impaired hearing, and RA. She presented to [facility] on 4/23 with CAP and SBO. CT on 4/28 show persistent SBO and patient was transferred to [facility] for [facility]. On 4/30, an ex lap, lysis of adhesions and small bowel resection was performed. As of 5/4, Patient has been ~12 days without nutrition and pharmacy was consulted 5/4 for TPN with plan for continued bowel rest and NG until bowel function returns.  GI: NGT removed 5/5, JP drain 85 mls. PPI IV. Pre-albumin <5. Dysphagia. Bedside swallow 5/5 - ok for thin liquids only. No N/V. +Flatus. LBM 5/7 x 8 with streaked BRB.  Endo: No history of diabetes. CBGs <150. Insulin requirements in the past 24 hours: 7 unit Lytes: K 4.2, Phos rising at 2.4 (slightly below goal), Mg down 1.6 (lytes were held in TPN); Corr Ca 10.3 (alb 1.1), CO2 low up slightly on Bicarb drip (maximizing acetate in TPN) Renal: Scr 2.53 (slightly up), BUN 71, GFR 17, off CRRT - no plan for dialysis. UOP 0.2 mL/kg/hr + 2 unmeasured occurences.  Pulm: Extubated on 5/3. RA Cards: Amiodarone drip at 30 mg/hr (s/p VT arrest earlier in admission); HR paced at 89, BP soft this AM. Hepatobil: AST trending back down (40); T Bili down to 2.3; ALT wnl. TG 78. Neuro: oriented to person only, complains of abdominal pain ID: WBC up 13.4>11.2, afebrile on Zosyn 5/2>>present.   TPN Access: CVC triple lumen placed 4/30 TPN start date: 5/4 Nutritional Goals (per RD recommendation on 5/4, re-estimated post extubation): Kcal: 2000-2200 kcal Protein:  100-120  grams Fluid:  >/= 2 L/day   Goal TPN rate is 80 ml/hr (provides 120 g of protein, 270 g of dextrose, and 60 g of lipids which provides 2000 kCals per day, meeting 100% of patient needs)  Current Nutrition:  Full liquids - consumed 30% of a meal and 2 Ensure Enlive TPN at 55 mL/hr  D5W with 150 meq of sodium bicarbonate at 70 ml/hr  Plan:  Continue TPN at 55 mL/hr per Dr. Algis Liming as increasing Bicarb infusion for today. Hopefully can go to goal tomorrow as would benefit from full nutrition.   This TPN provides  82.5g of protein, 186g of dextrose, and 41.1g of lipids which provides 1374 kCals per day, meeting 69% of patient's protein and calorie needs. Electrolytes in TPN: Per discussion with Dr. Algis Liming, ok to add back lytes in TPN cautiously. Maximize acetate. Add MVI to TPN - trace elements are on back order, will only place in TPN on Monday, Wednesday and Friday Continue sensitive SSI and adjust as needed Monitor TPN labs Follow-up renal function, bicarb drip and diuresis  Alanda Slim, PharmD, [facility] Clinical Pharmacist Please see AMION for all Pharmacists' Contact Phone Numbers 08/29/2018, 7:51 AM

## 2018-08-29 NOTE — Progress Notes (Signed)
Physical Therapy Treatment Patient Details Name: [name] MRN: [id number] DOB: 21-Dec-1940 Today's Date: 08/29/2018    History of Present Illness 78 year old female with significant cardiac history that is post exploratory laparotomy for small bowel obstruction with associated ischemic bowel on 4/30, return to the intensive care on mechanical ventilator, critical care asked to assist with care.  VDRF 4/30-5/3.  Also PNA.  QHU:[id number] heart failure with EF 30%, nonsustained VT, pericardial effusion CAD w/ prior AICD, diastolic heart failure, CKD stage III, hypertension, rheumatoid arthritis, anemia of chronic disease.    PT Comments    Pt is making slow and steady progress with mobility. Pt performed short in-room ambulation distance, requiring mod-max verbal encouragement to participate in gait. Pt also participated in repeated sit to stands and UE/LE exercises for strengthening. Pt with stable VS throughout session, PT and OT noting heavy accessory muscle breathing with mobility but stable O2sats at 94-96% on RA. PT to continue to follow acutely.    Follow Up Recommendations  SNF;Supervision/Assistance - 24 hour     Equipment Recommendations  Other (comment)(TBA)    Recommendations for Other Services       Precautions / Restrictions Precautions Precautions: Fall Precaution Comments: JP drain, flexiseal Restrictions Weight Bearing Restrictions: No    Mobility  Bed Mobility Overal bed mobility: Needs Assistance Bed Mobility: Supine to Sit Rolling: Max assist Sidelying to sit: Mod assist       General bed mobility comments: Max assist for rolling to L for LE management and rolling of torso with use of bed rail, mod assist for sidelying to sit for trunk elevation.  Transfers Overall transfer level: Needs assistance Equipment used: Rolling walker (2 wheeled) Transfers: Sit to/from Stand Sit to Stand: +2 physical assistance;Min assist;Mod assist;Max assist          General transfer comment: sit to stand x3, requiring increased assist with each sit to stand and from lower surface of recliner. Max verbal cuing for leaning forward prior to power up and Closed chain hip extension to come to full standing.   Ambulation/Gait Ambulation/Gait assistance: Min assist;+2 safety/equipment;+2 physical assistance Gait Distance (Feet): 5 Feet Assistive device: Rolling walker (2 wheeled) Gait Pattern/deviations: Step-through pattern;Decreased stride length;Trunk flexed;Drifts right/left Gait velocity: decr    General Gait Details: Min assist +2 for steadying, guiding pt to recliner and assist for lowering into chair. verbal cuing for sequencing, placement in RW   Stairs             Wheelchair Mobility    Modified Rankin (Stroke Patients Only)       Balance Overall balance assessment: Needs assistance Sitting-balance support: Feet supported;Bilateral upper extremity supported Sitting balance-Leahy Scale: Poor Sitting balance - Comments: able to sit min guard for seconds at a time before requiring mod-max assist to recover upright sitting Postural control: Posterior lean Standing balance support: Bilateral upper extremity supported;During functional activity Standing balance-Leahy Scale: Poor Standing balance comment: reliant on B UE support of walker and external assist for balance                            Cognition Arousal/Alertness: Awake/alert Behavior During Therapy: Flat affect Overall Cognitive Status: Impaired/Different from baseline Area of Impairment: Orientation;Memory;Following commands;Safety/judgement;Problem solving                 Orientation Level: Place;Situation(knew it was May)   Memory: Decreased short-term memory Following Commands: Follows one step commands with increased time Safety/Judgement:  Decreased awareness of safety;Decreased awareness of deficits   Problem Solving: Slow processing;Decreased  initiation;Difficulty sequencing;Requires verbal cues;Requires tactile cues General Comments: Max verbal cuing to encourage pt      Exercises General Exercises - Lower Extremity Hip Flexion/Marching: AROM;Both;10 reps;Seated Other Exercises Other Exercises: AAROM B shoulders x 10 seated in chair    General Comments General comments (skin integrity, edema, etc.): VSS      Pertinent Vitals/Pain Pain Assessment: Faces Faces Pain Scale: Hurts little more Pain Location: back Pain Descriptors / Indicators: Grimacing Pain Intervention(s): Monitored during session;Repositioned;Limited activity within patient's tolerance    Home Living                      Prior Function            PT Goals (current goals can now be found in the care plan section) Acute Rehab PT Goals Patient Stated Goal: get stronger and safe to go home PT Goal Formulation: With patient Time For Goal Achievement: 09/08/18 Potential to Achieve Goals: Good Progress towards PT goals: Progressing toward goals    Frequency    Min 2X/week      PT Plan Current plan remains appropriate    Co-evaluation PT/OT/SLP Co-Evaluation/Treatment: Yes Reason for Co-Treatment: Complexity of the patient's impairments (multi-system involvement) PT goals addressed during session: Mobility/safety with mobility;Balance OT goals addressed during session: ADL's and self-care;Strengthening/ROM      AM-PAC PT "6 Clicks" Mobility   Outcome Measure  Help needed turning from your back to your side while in a flat bed without using bedrails?: Total Help needed moving from lying on your back to sitting on the side of a flat bed without using bedrails?: A Lot Help needed moving to and from a bed to a chair (including a wheelchair)?: A Lot Help needed standing up from a chair using your arms (e.g., wheelchair or bedside chair)?: A Lot Help needed to walk in hospital room?: A Little Help needed climbing 3-5 steps with a  railing? : Total 6 Click Score: 11    End of Session Equipment Utilized During Treatment: Gait belt Activity Tolerance: Patient limited by fatigue;Patient limited by pain Patient left: in chair;with call bell/phone within reach;with chair alarm set Nurse Communication: Mobility status PT Visit Diagnosis: Unsteadiness on feet (R26.81);Muscle weakness (generalized) (M62.81)     Time: 2233-6122 PT Time Calculation (min) (ACUTE ONLY): 31 min  Charges:  $Gait Training: 8-22 mins                     Julien Girt, PT Acute Rehabilitation Services Pager [phone]  Office [phone]   Shae Hinnenkamp D Elonda Husky 08/29/2018, 12:19 PM

## 2018-08-30 LAB — BASIC METABOLIC PANEL
Anion gap: 9 (ref 5–15)
BUN: 74 mg/dL — ABNORMAL HIGH (ref 8–23)
CO2: 19 mmol/L — ABNORMAL LOW (ref 22–32)
Calcium: 8 mg/dL — ABNORMAL LOW (ref 8.9–10.3)
Chloride: 113 mmol/L — ABNORMAL HIGH (ref 98–111)
Creatinine, Ser: 2.42 mg/dL — ABNORMAL HIGH (ref 0.44–1.00)
GFR calc Af Amer: 21 mL/min — ABNORMAL LOW (ref >60)
GFR calc non Af Amer: 18 mL/min — ABNORMAL LOW (ref >60)
Glucose, Bld: 125 mg/dL — ABNORMAL HIGH (ref 70–99)
Potassium: 4 mmol/L (ref 3.5–5.1)
Sodium: 141 mmol/L (ref 135–145)

## 2018-08-30 LAB — HEMOGLOBIN AND HEMATOCRIT, BLOOD
HCT: 22.9 % — ABNORMAL LOW (ref 36.0–46.0)
Hemoglobin: 7.9 g/dL — ABNORMAL LOW (ref 12.0–15.0)

## 2018-08-30 LAB — CBC
HCT: 19.1 % — ABNORMAL LOW (ref 36.0–46.0)
Hemoglobin: 6.7 g/dL — CL (ref 12.0–15.0)
MCH: 27.1 pg (ref 26.0–34.0)
MCHC: 35.1 g/dL (ref 30.0–36.0)
MCV: 77.3 fL — ABNORMAL LOW (ref 80.0–100.0)
Platelets: 119 10*3/uL — ABNORMAL LOW (ref 150–400)
RBC: 2.47 MIL/uL — ABNORMAL LOW (ref 3.87–5.11)
RDW: 22.3 % — ABNORMAL HIGH (ref 11.5–15.5)
WBC: 10.4 10*3/uL (ref 4.0–10.5)
nRBC: 0 % (ref 0.0–0.2)

## 2018-08-30 LAB — GLUCOSE, CAPILLARY
Glucose-Capillary: 116 mg/dL — ABNORMAL HIGH (ref 70–99)
Glucose-Capillary: 117 mg/dL — ABNORMAL HIGH (ref 70–99)
Glucose-Capillary: 196 mg/dL — ABNORMAL HIGH (ref 70–99)
Glucose-Capillary: 151 mg/dL — ABNORMAL HIGH (ref 70–99)
Glucose-Capillary: 208 mg/dL — ABNORMAL HIGH (ref 70–99)

## 2018-08-30 LAB — PREPARE RBC (CROSSMATCH)

## 2018-08-30 MED ORDER — SODIUM CHLORIDE 0.9% IV SOLUTION
Freq: Once | INTRAVENOUS | Status: AC
  Administered 2018-08-30: 07:00:00 via INTRAVENOUS

## 2018-08-30 MED ORDER — TRAVASOL 10 % IV SOLN
INTRAVENOUS | Status: AC
  Administered 2018-08-30: 18:00:00 via INTRAVENOUS
  Filled 2018-08-30: qty 1200

## 2018-08-30 NOTE — Progress Notes (Signed)
CRITICAL VALUE ALERT  Critical Value:  Hgb 6.7  Date & Time Notied:  3:34  Provider Notified:Bodenheimer paged at 3:36am, paged again 4:12am  Orders Received/Actions taken: type and screen and transfuse 1 unit of blood per MD.

## 2018-08-30 NOTE — Progress Notes (Addendum)
PROGRESS NOTE    [name]  VWP:[id number] DOB: 08-Apr-1941 DOA: 08/13/2018 PCP: [name], MD  Brief Narrative: PCCM Pick up 5/5 Is a chronically ill 78 year old female with history of chronic systolic CHF, nonsustained V. tach, pericardial effusion, CAD, AICD, chronic diastolic CHF, chronic kidney disease stage III, hypertension, rheumatoid arthritis, anemia of chronic disease was admitted to [facility] on 4/22 with small bowel obstruction, also started on antibiotics for possible pneumonia -Failed conservative management developed high-grade small bowel obstruction by 4/28 -4/29 transferred to [facility] -4/30 went to the OR underwent ex lap with lysis of adhesion, bowel resection for ischemic bowel, returned to the ICU on ventilator, hypotensive -4/30 shocked for VT, started on pressors -5/1: Developed progressive renal failure oliguria -5/2: Off pressors -5/3: Extubated General surgery following, currently on TNA, awaiting return of bowel function 5/5: Transferred to [place]:   Septic shock -Resolved -Status post VDRF and pressor support, extubated 5/3, weaned off pressors 5/2. Stable  Small bowel obstruction, ischemic colon/postop ileus -Failed conservative management of bowel obstruction at [facility] -On 4/30 underwent ex lap for SBO and small bowel resection of ischemic necrotic bowel -On IV Zosyn since 4/30, total 10 days per CCS. -On TNA awaiting return of bowel function.  NG tube discontinued. -Per general surgery -Wound VAC has been removed 5/6.  Wound is currently open and fascia intact. -DC Foley 5/7 -Tolerating dysphagia 1 diet recommended by speech therapy. -Had 2 bloody BMs on 5/7 but none since. -Ongoing diarrhea since 5/7.  Has Flexi-Seal.  Stools not consistent with C. difficile.  Monitor. -CCS follow-up appreciated and feel that bloody BM was likely from staple line.  Plan to DC JP drain if output decreases.  Acute  lower GI bleed -As per general surgery, felt to be due to staple line -DC subcutaneous heparin.  Has SCDs for DVT prophylaxis. -Seems to have resolved.  Continue to monitor closely.  Acute kidney injury on CKD stage II -Baseline creatinine around 1.2 -Creatinine worsened to 3.2, in the setting of septic shock, ATN, ischemic bowel -Creatinine improved.  Creatinine has plateaued in the 2.4-2.5 range for the last 4 days.  Hypokalemia resolved -Continue TNA, renal dosing medications.   -Improving.  Monitor BMP closely.  Hypophosphatemia/hypomagnesemia Replaced  NAG metabolic acidosis Likely due to diarrhea and mild hyperchloremia.  Continue oral sodium bicarbonate supplements.  Bicarb has improved from 15-19.  Chronic systolic CHF, AICD -EF of 30% with by V ICD -Off diuretics, ACE inhibitor in the setting of AKI and septic shock -Not overtly volume overloaded.  Continue to hold off diuretics for now.  No wheezing noted.  Acute on chronic anemia -Acute on chronic -Hemoglobin has further drifted down to 6.7 on 5/9 in the absence of overt bleeding.  Transfusing 1 unit of PRBC. -Anemia panel reviewed: Suggestive of anemia of chronic disease. -Follow CBC posttransfusion and aim to keep hemoglobin greater than 7 g per DL.  Paroxysmal atrial fibrillation -Status post DC cardioversion on 4/29, currently in sinus rhythm - No anticoagulation currently due to GI bleed issues. -Cardiology switched her from IV to oral amiodarone on 5/7.  I discussed with Dr. Katharina Caper prior to sign off on 5/7, continue amiodarone, low-dose carvedilol initiated, may restart Lasix when able. -Outpatient follow-up with Dr. [name].  Severe protein calorie malnutrition -Advancing to dysphagia 1 diet.  History of CAD -Carvedilol 3.125 mg twice daily started 5/7.  Thrombocytopenia Platelet count up to 119.  DVT prophylaxis: SCDs  Code Status: Full code Family Communication: I called spouse on 5/8, updated care  and answered questions. Disposition Plan: Patient not yet medically ready for discharge.  Will likely need SNF for rehab when stable for discharge.  As per clinical social worker, patient will need COVID 19 testing prior to DC to SNF.  Will order this test approximately 48 hours prior to potential discharge.  No clinical suspicion for COVID-19 at this time.  Consultants:   Cardiology  PCCM  Surgery     Procedures:   -DC cardioversion 4/29  -4/30 :Ex lap with lysis of adhesion, bowel resection for ischemic bowel,  Patient has right IJ, PPM, Foley catheter (discontinued 5/7) and abdominal wound VAC (discontinued 5/6).  Antimicrobials:    Subjective: Denies complaints.  No dyspnea or chest pain indicated.  As per RN, no acute issues.  Was yet to start PRBC transfusion this morning.  Flexi-Seal with loose stools.  Objective: Vitals:   08/30/18 1015 08/30/18 1051 08/30/18 1113 08/30/18 1154  BP: (!) 118/46 (!) 113/43 (!) 107/47 (!) 128/53  Pulse: 86 87 76 83  Resp: (!) 25 (!) 28 18 (!) 28  Temp: 97.7 F (36.5 C) 97.6 F (36.4 C) 97.7 F (36.5 C) (!) 97.3 F (36.3 C)  TempSrc: Oral Oral Oral Oral  SpO2: 94% 97% 97% 97%  Weight:      Height:        Intake/Output Summary (Last 24 hours) at 08/30/2018 1218 Last data filed at 08/30/2018 1100 Gross per 24 hour  Intake 1646.21 ml  Output 1075 ml  Net 571.21 ml   Filed Weights   08/24/18 0500 08/28/18 0422 08/30/18 0515  Weight: 72.8 kg 71.2 kg 74.3 kg    Examination:  General exam: Early, frail chronically ill female, very hard of hearing lying comfortably propped up in bed.  Does not appear in any distress. Respiratory system: Clear to auscultation.  No increased work of breathing. Cardiovascular system: S1 & S2 heard, RRR.  No JVD or murmurs.  No pedal edema.  Telemetry personally reviewed: V paced rhythm.  Gastrointestinal system: Abdomen is soft, nondistended, nontender, surgical site dressing clean and dry.  Normal  bowel sounds.  Surgical wound as per picture in CCS progress note from 5/9 >large midline incision, some granulation tissue admixed with fibrinous exudate.  Flexi-Seal bag almost half full with soft/loose yellow stools.    Central nervous system: Alert and oriented x2.  No focal deficits. Extremities: right IJ PICC line.  Right forearm dressing clean and dry. Skin: No rashes, lesions or ulcers Psychiatry:  Mood & affect appropriate.     Data Reviewed:   CBC: Recent Labs  Lab 08/26/18 0500 08/28/18 0458 08/28/18 1500 08/29/18 0240 08/30/18 0321  WBC 10.2 13.4* 12.1* 11.2* 10.4  NEUTROABS 8.5*  --   --   --   --   HGB 8.3* 8.0* 7.8* 7.5* 6.7*  HCT 24.1* 22.8* 23.1* 21.3* 19.1*  MCV 77.0* 77.6* 79.4* 77.5* 77.3*  PLT 92* 102* 104* 108* 166*   Basic Metabolic Panel: Recent Labs  Lab 08/26/18 0500 08/27/18 0513 08/27/18 1700 08/28/18 0458 08/28/18 1500 08/29/18 0240 08/30/18 0321  NA 147* 143 140 139 139 140 141  K 2.6* 2.8* 3.5 3.7 3.6 4.2 4.0  CL 115* 112* 112* 115* 115* 115* 113*  CO2 19* 17* 18* 15* 14* 15* 19*  GLUCOSE 131* 135* 151* 128* 151* 123* 125*  BUN 63* 61* 64* 68* 68* 71* 74*  CREATININE 3.02* 2.56* 2.46*  2.50* 2.51* 2.53* 2.42*  CALCIUM 7.6* 7.8* 8.0* 7.8* 7.7* 8.0* 8.0*  MG 2.2 1.7  --  1.6* 2.0  --   --   PHOS 3.4 2.2*  --  2.4* 2.5  --   --    GFR: Estimated Creatinine Clearance: 18.6 mL/min (A) (by C-G formula based on SCr of 2.42 mg/dL (H)). Liver Function Tests: Recent Labs  Lab 08/24/18 0444 08/26/18 0500 08/27/18 0513 08/28/18 0458 08/29/18 0240  AST 55* 60* 54* 51* 40  ALT 10 13 15 18 19   ALKPHOS 201* 226* 220* 223* 204*  BILITOT 1.8* 2.1* 2.0* 2.2* 2.3*  PROT 4.6* 4.9* 4.6* 4.5* 4.7*  ALBUMIN 1.3* 1.2* 1.1* 1.1* 1.1*   Cardiac Enzymes: No results for input(s): CKTOTAL, CKMB, CKMBINDEX, TROPONINI in the last 168 hours. CBG: Recent Labs  Lab 08/29/18 2049 08/29/18 2356 08/30/18 0509 08/30/18 0832 08/30/18 1158  GLUCAP 138*  117* 116* 117* 196*   Lipid Profile: No results for input(s): CHOL, HDL, LDLCALC, TRIG, CHOLHDL, LDLDIRECT in the last 72 hours. Urine analysis:    Component Value Date/Time   COLORURINE AMBER (A) 08/13/2018 1849   APPEARANCEUR CLOUDY (A) 08/13/2018 1849   LABSPEC 1.016 08/13/2018 1849   PHURINE 5.0 08/13/2018 1849   GLUCOSEU NEGATIVE 08/13/2018 1849   HGBUR MODERATE (A) 08/13/2018 1849   BILIRUBINUR NEGATIVE 08/13/2018 1849   KETONESUR NEGATIVE 08/13/2018 1849   PROTEINUR 30 (A) 08/13/2018 1849   UROBILINOGEN 0.2 07/17/2013 2240   NITRITE NEGATIVE 08/13/2018 1849   LEUKOCYTESUR TRACE (A) 08/13/2018 1849    Recent Results (from the past 240 hour(s))  Surgical pcr screen     Status: Abnormal   Collection Time: 08/20/18 11:50 PM  Result Value Ref Range Status   MRSA, PCR POSITIVE (A) NEGATIVE Final    Comment: RESULT CALLED TO, READ BACK BY AND VERIFIED WITH: I BATOON RN 08/21/18 0110 JDW    Staphylococcus aureus POSITIVE (A) NEGATIVE Final    Comment: (NOTE) The Xpert SA Assay (FDA approved for NASAL specimens in patients 71 years of age and older), is one component of a comprehensive surveillance program. It is not intended to diagnose infection nor to guide or monitor treatment. Performed at [facility], [place] [address], [place], [place] [address]          Radiology Studies: No results found.      Scheduled Meds: . sodium chloride   Intravenous Once  . amiodarone  200 mg Oral Daily  . carvedilol  3.125 mg Oral BID WC  . chlorhexidine  15 mL Mouth Rinse BID  . feeding supplement (ENSURE ENLIVE)  237 mL Oral TID BM  . insulin aspart  0-9 Units Subcutaneous Q4H  . mouth rinse  15 mL Mouth Rinse q12n4p  . mupirocin ointment   Nasal BID  . pantoprazole (PROTONIX) IV  40 mg Intravenous QHS  . sodium bicarbonate  1,300 mg Oral BID   Continuous Infusions: . piperacillin-tazobactam (ZOSYN)  IV 100 mL/hr at 08/30/18 0542  . TPN ADULT (ION) 55 mL/hr at  08/30/18 0542  . TPN ADULT (ION)       LOS: 17 days    Vernell Leep, MD, FACP, [facility]. [facility]  To contact the attending provider between 7A-7P or the covering provider during after hours 7P-7A, please log into the web site [email or web address] and access using universal [place] password for that web site. If you do not have the password, please call the hospital operator.

## 2018-08-30 NOTE — Progress Notes (Signed)
[facility] Progress Note  9 Days Post-Op  Subjective: CC-  No complaints this morning. Denies abdominal pain, n/v. Per CNA patient is tolerating diet when she is assisted with feeding. There is report of a bloody BM but stool from rectal tube is loose and nonbloody.  Hemoglobin 6.7 this AM, getting 1 unit PRBCs. VSS.  Objective: Vital signs in last 24 hours: Temp:  [97.4 F (36.3 C)-97.7 F (36.5 C)] 97.4 F (36.3 C) (05/09 0825) Pulse Rate:  [73-84] 78 (05/09 0825) Resp:  [16-26] 26 (05/09 0825) BP: (105-121)/(41-56) 120/56 (05/09 0825) SpO2:  [94 %-100 %] 94 % (05/09 0825) Weight:  [74.3 kg] 74.3 kg (05/09 0515) Last BM Date: 08/29/18  Intake/Output from previous day: 05/08 0701 - 05/09 0700 In: 1231.2 [P.O.:400; I.V.:686.1; IV Piggyback:145.1] Out: 1875 [Urine:1550; Drains:75; Stool:250] Intake/Output this shift: No intake/output data recorded.  PE: Gen:  Alert, NAD, pleasant HEENT: EOM's intact, pupils equal and round Pulm:  effort normal Skin: warm and dry Abd: Soft, NT/ND, +BS, open midline incision with some granulation tissue at base and on walls, fibrinous exudate covers proximal portion, JP drain with serosanguinous output     Lab Results:  Recent Labs    08/29/18 0240 08/30/18 0321  WBC 11.2* 10.4  HGB 7.5* 6.7*  HCT 21.3* 19.1*  PLT 108* 119*   BMET Recent Labs    08/29/18 0240 08/30/18 0321  NA 140 141  K 4.2 4.0  CL 115* 113*  CO2 15* 19*  GLUCOSE 123* 125*  BUN 71* 74*  CREATININE 2.53* 2.42*  CALCIUM 8.0* 8.0*   PT/INR No results for input(s): LABPROT, INR in the last 72 hours. CMP     Component Value Date/Time   NA 141 08/30/2018 0321   K 4.0 08/30/2018 0321   CL 113 (H) 08/30/2018 0321   CO2 19 (L) 08/30/2018 0321   GLUCOSE 125 (H) 08/30/2018 0321   BUN 74 (H) 08/30/2018 0321   CREATININE 2.42 (H) 08/30/2018 0321   CREATININE 1.07 (H) 02/13/2018 1142   CALCIUM 8.0 (L) 08/30/2018 0321   PROT 4.7 (L)  08/29/2018 0240   ALBUMIN 1.1 (L) 08/29/2018 0240   AST 40 08/29/2018 0240   ALT 19 08/29/2018 0240   ALKPHOS 204 (H) 08/29/2018 0240   BILITOT 2.3 (H) 08/29/2018 0240   GFRNONAA 18 (L) 08/30/2018 0321   GFRNONAA 49 (L) 02/13/2018 1142   GFRAA 21 (L) 08/30/2018 0321   GFRAA 57 (L) 02/13/2018 1142   Lipase     Component Value Date/Time   LIPASE 30 08/13/2018 1926       Studies/Results: No results found.  Anti-infectives: Anti-infectives (From admission, onward)   Start     Dose/Rate Route Frequency Ordered Stop   08/23/18 1400  piperacillin-tazobactam (ZOSYN) IVPB 2.25 g     2.25 g 100 mL/hr over 30 Minutes Intravenous Every 8 hours 08/23/18 0928 08/30/18 2359   08/21/18 1200  piperacillin-tazobactam (ZOSYN) IVPB 3.375 g  Status:  Discontinued     3.375 g 12.5 mL/hr over 240 Minutes Intravenous Every 8 hours 08/21/18 1102 08/23/18 0928   08/14/18 1000  hydroxychloroquine (PLAQUENIL) tablet 200 mg  Status:  Discontinued    Note to Pharmacy:  Take 1 tablet by mouth daily Monday through Friday only     200 mg Oral Once per day on Mon Tue Wed Thu Fri 08/14/18 0742 08/21/18 1147   08/13/18 2200  doxycycline (VIBRA-TABS) tablet 100 mg  Status:  Discontinued  100 mg Oral Every 12 hours 08/13/18 2134 08/13/18 2135   08/13/18 2200  doxycycline (VIBRAMYCIN) 100 mg in sodium chloride 0.9 % 250 mL IVPB  Status:  Discontinued     100 mg 125 mL/hr over 120 Minutes Intravenous Every 12 hours 08/13/18 2135 08/19/18 1702   08/13/18 2145  cefTRIAXone (ROCEPHIN) 1 g in sodium chloride 0.9 % 100 mL IVPB  Status:  Discontinued     1 g 200 mL/hr over 30 Minutes Intravenous Every 24 hours 08/13/18 2134 08/14/18 0743       Assessment/Plan POD9, s/p ex lapw/SBR, LOA, appendectomyfor SBO&ischemic/necroticbowel, 4/30 - PT -Extubated.Offpressors.Out of unit.AppreciateTRHhelp in her care -ContTPNtill inc intake. Pre-alb <5 (5/5).Hopefully can be weaned soon. DYS1 diet and as  tolerated per speech - Bloody BM 5/7. Sq Heparin held.  - BID WTD dressing changes. - cont JP drain for now. d/c when output decreases   - cont zosyn (10 days total) - Path benign. 58cm SB w/ hemorrhage and prominent necrosis w/ viable margins.Appendix w/ fibrous obliteration and patchy acute serositis. - Mobilize and IS  Acute on chronic renal failure - Cr downtrending 2.42 -Defer fluid balance to TRH. Foley out  ICD/CAD/CHF -per cardiology.  -on amio  Acute Respiratory Failure - Off vent.Improving. Per TRH  ABL Anemia  -  Hg 6.7, getting 1 unit PRBCs this AM - no signs of external bleeding this morning, no blood in stool. VSS  FEN -TPN,DYS1 (AAT per speech) VTE - SCDs, Mobilize as tolerating with PT. ID -zosyn 4/30 (10 days total) WBC normalized, afebrile Foley - Removed 5/7 Follow up - Dr. Marlou Starks    LOS: 38 days    Wellington Hampshire , [facility] Surgery 08/30/2018, 8:47 AM Pager: [phone] Mon-Thurs 7:00 am-4:30 pm Fri 7:00 am -11:30 AM Sat-Sun 7:00 am-11:30 am

## 2018-08-30 NOTE — Progress Notes (Addendum)
PHARMACY - ADULT TOTAL PARENTERAL NUTRITION CONSULT NOTE   Pharmacy Consult for TPN Indication: s/p ex lap with SBR for SBO and ischemic/necroticbowel  Patient Measurements: Height: 5\' 4"  (162.6 cm) Weight: 163 lb 12.8 oz (74.3 kg) IBW/kg (Calculated) : 54.7 TPN AdjBW (KG): 64.8 Body mass index is 28.12 kg/m.  Assessment:  78 year old female with PMH significant for anemia, CAD s/p AICD placement, breast cancer, CKD III, CHF, HLD, impaired hearing, and RA. She presented to [facility] on 4/23 with CAP and SBO. CT on 4/28 show persistent SBO and patient was transferred to [facility] for surgery. On 4/30, an ex lap, lysis of adhesions and small bowel resection was performed. As of 5/4, Patient has been ~12 days without nutrition and pharmacy was consulted 5/4 for TPN with plan for continued bowel rest and NG until bowel function returns.  GI: NGT removed 5/5, JP drain 75 mls. PPI IV. Pre-albumin <5. No N/V. +Flatus. LBM 5/8 (250 mL). [place] for Dyp 1 diet per speech 5/7 Endo: No history of diabetes. CBGs <150. Insulin requirements in the past 24 hours: 7 unit Lytes: K 4, Phos 2.5 on 5/7, Mg 2 on 5/7; Corr Ca 10.3 (alb 1.1), CO2 up s/p Bicarb drip (maximizing acetate in TPN) - bicarb tablet added. Renal: Scr 2.42 (down), BUN 74, off CRRT - no plan for dialysis. UOP 0.2 mL/kg/hr + 2 unmeasured occurences.  Pulm: Extubated on 5/3. RA Cards: Amiodarone drip at 30 mg/hr (s/p VT arrest earlier in admission); HR paced at 80s, BP soft this AM. Hepatobil: AST trending back down (40); T Bili down to 2.3; ALT wnl. TG 78. Neuro: oriented to person only, complains of abdominal pain ID: WBC down to normal limits, afebrile on Zosyn 5/2>>present.   TPN Access: CVC triple lumen placed 4/30 TPN start date: 5/4 Nutritional Goals (per RD recommendation on 5/4, re-estimated post extubation): Kcal: 2000-2200 kcal Protein:  100-120 grams Fluid:  >/= 2 L/day   Goal TPN rate is 80 ml/hr (provides 120 g of protein, 270 g of  dextrose, and 60 g of lipids which provides 2000 kCals per day, meeting 100% of patient needs)  Current Nutrition:  Full liquids - consumed 15-50% of a meals  Ensure Enlive (charted 3) 5/9: Changing to Dysphagia 1 diet  TPN    Plan: Increase TPN to 80 mL/hr.   This TPN provides  100g of protein, 270g of dextrose, and 60g of lipids which provides 1374 kCals per day, meeting 69% of patient's protein and calorie needs. Electrolytes in TPN: Per discussion with Dr. Algis Liming, ok to add back lytes in TPN cautiously- continue reduced amounts. Maximize acetate. Add MVI to TPN - trace elements are on back order, will only place in TPN on Monday, Wednesday and Friday Continue sensitive SSI and adjust as needed Monitor TPN labs- recheck BMET, Mg, and Phos in AM Follow-up renal function and diuresis.  Sloan Leiter, PharmD, BCPS, BCCCP Clinical Pharmacist Please refer to [facility] for [place] numbers 08/30/2018, 6:59 AM

## 2018-08-31 ENCOUNTER — Inpatient Hospital Stay (HOSPITAL_COMMUNITY): Payer: Medicare Other

## 2018-08-31 DIAGNOSIS — K591 Functional diarrhea: Secondary | ICD-10-CM

## 2018-08-31 LAB — TYPE AND SCREEN
ABO/RH(D): AB POS
Antibody Screen: NEGATIVE
Unit division: 0

## 2018-08-31 LAB — BASIC METABOLIC PANEL
Anion gap: 12 (ref 5–15)
BUN: 81 mg/dL — ABNORMAL HIGH (ref 8–23)
CO2: 20 mmol/L — ABNORMAL LOW (ref 22–32)
Calcium: 8.1 mg/dL — ABNORMAL LOW (ref 8.9–10.3)
Chloride: 110 mmol/L (ref 98–111)
Creatinine, Ser: 2.34 mg/dL — ABNORMAL HIGH (ref 0.44–1.00)
GFR calc Af Amer: 22 mL/min — ABNORMAL LOW (ref >60)
GFR calc non Af Amer: 19 mL/min — ABNORMAL LOW (ref >60)
Glucose, Bld: 121 mg/dL — ABNORMAL HIGH (ref 70–99)
Potassium: 4.1 mmol/L (ref 3.5–5.1)
Sodium: 142 mmol/L (ref 135–145)

## 2018-08-31 LAB — CBC
HCT: 22 % — ABNORMAL LOW (ref 36.0–46.0)
Hemoglobin: 7.5 g/dL — ABNORMAL LOW (ref 12.0–15.0)
MCH: 26.9 pg (ref 26.0–34.0)
MCHC: 34.1 g/dL (ref 30.0–36.0)
MCV: 78.9 fL — ABNORMAL LOW (ref 80.0–100.0)
Platelets: 135 10*3/uL — ABNORMAL LOW (ref 150–400)
RBC: 2.79 MIL/uL — ABNORMAL LOW (ref 3.87–5.11)
RDW: 21.5 % — ABNORMAL HIGH (ref 11.5–15.5)
WBC: 10.1 10*3/uL (ref 4.0–10.5)
nRBC: 0 % (ref 0.0–0.2)

## 2018-08-31 LAB — GLUCOSE, CAPILLARY
Glucose-Capillary: 127 mg/dL — ABNORMAL HIGH (ref 70–99)
Glucose-Capillary: 128 mg/dL — ABNORMAL HIGH (ref 70–99)
Glucose-Capillary: 129 mg/dL — ABNORMAL HIGH (ref 70–99)
Glucose-Capillary: 141 mg/dL — ABNORMAL HIGH (ref 70–99)
Glucose-Capillary: 143 mg/dL — ABNORMAL HIGH (ref 70–99)
Glucose-Capillary: 153 mg/dL — ABNORMAL HIGH (ref 70–99)

## 2018-08-31 LAB — BPAM RBC
Blood Product Expiration Date: 202005222359
ISSUE DATE / TIME: 202005091015
Unit Type and Rh: 8400

## 2018-08-31 LAB — PHOSPHORUS: Phosphorus: 3.3 mg/dL (ref 2.5–4.6)

## 2018-08-31 LAB — MAGNESIUM: Magnesium: 1.8 mg/dL (ref 1.7–2.4)

## 2018-08-31 MED ORDER — ALBUTEROL SULFATE (2.5 MG/3ML) 0.083% IN NEBU
2.5000 mg | INHALATION_SOLUTION | RESPIRATORY_TRACT | Status: DC
  Administered 2018-08-31 (×2): 2.5 mg via RESPIRATORY_TRACT
  Filled 2018-08-31 (×2): qty 3

## 2018-08-31 MED ORDER — TRAVASOL 10 % IV SOLN
INTRAVENOUS | Status: AC
  Administered 2018-08-31: 18:00:00 via INTRAVENOUS
  Filled 2018-08-31: qty 1200

## 2018-08-31 MED ORDER — SACCHAROMYCES BOULARDII 250 MG PO CAPS
250.0000 mg | ORAL_CAPSULE | Freq: Two times a day (BID) | ORAL | Status: DC
  Administered 2018-08-31 – 2018-09-19 (×37): 250 mg via ORAL
  Filled 2018-08-31 (×38): qty 1

## 2018-08-31 MED ORDER — FUROSEMIDE 10 MG/ML IJ SOLN
40.0000 mg | Freq: Once | INTRAMUSCULAR | Status: AC
  Administered 2018-08-31: 40 mg via INTRAVENOUS
  Filled 2018-08-31: qty 4

## 2018-08-31 NOTE — Progress Notes (Addendum)
MD notified and new orders received O2 applied via [place] @ 2L

## 2018-08-31 NOTE — Progress Notes (Signed)
[facility] Progress Note  10 Days Post-Op  Subjective: CC-  No complaints this morning. Denies abdominal pain, n/v. Per RN patient ate ice cream and apple sauce for breakfast, chicken and dumplings for lunch yesterday. She has had several loose stool. flexiseal has been leaking. No blood in stool. hgb 7.9 after blood transfusion, 7.5 today. VSS.  Objective: Vital signs in last 24 hours: Temp:  [97.3 F (36.3 C)-98.5 F (36.9 C)] 98.1 F (36.7 C) (05/10 0813) Pulse Rate:  [76-87] 81 (05/10 0813) Resp:  [18-28] 21 (05/10 0813) BP: (107-134)/(43-56) 108/44 (05/10 0813) SpO2:  [94 %-99 %] 97 % (05/10 0813) Last BM Date: 08/31/18  Intake/Output from previous day: 05/09 0701 - 05/10 0700 In: 1976.4 [P.O.:440; I.V.:781.5; Blood:630; IV Piggyback:104.9] Out: 135 [Urine:100; Drains:35] Intake/Output this shift: No intake/output data recorded.  PE: Gen:  Alert, NAD, pleasant HEENT: EOM's intact, pupils equal and round Pulm:  effort normal Skin: warm and dry Abd: Soft, NT/ND, +BS, open midline incision with some granulation tissue at base and on walls, trace serous drainage, fibrinous exudate covers proximal portion, JP drain with serosanguinous output     Lab Results:  Recent Labs    08/30/18 0321 08/30/18 2205 08/31/18 0524  WBC 10.4  --  10.1  HGB 6.7* 7.9* 7.5*  HCT 19.1* 22.9* 22.0*  PLT 119*  --  135*   BMET Recent Labs    08/30/18 0321 08/31/18 0524  NA 141 142  K 4.0 4.1  CL 113* 110  CO2 19* 20*  GLUCOSE 125* 121*  BUN 74* 81*  CREATININE 2.42* 2.34*  CALCIUM 8.0* 8.1*   PT/INR No results for input(s): LABPROT, INR in the last 72 hours. CMP     Component Value Date/Time   NA 142 08/31/2018 0524   K 4.1 08/31/2018 0524   CL 110 08/31/2018 0524   CO2 20 (L) 08/31/2018 0524   GLUCOSE 121 (H) 08/31/2018 0524   BUN 81 (H) 08/31/2018 0524   CREATININE 2.34 (H) 08/31/2018 0524   CREATININE 1.07 (H) 02/13/2018 1142   CALCIUM 8.1 (L)  08/31/2018 0524   PROT 4.7 (L) 08/29/2018 0240   ALBUMIN 1.1 (L) 08/29/2018 0240   AST 40 08/29/2018 0240   ALT 19 08/29/2018 0240   ALKPHOS 204 (H) 08/29/2018 0240   BILITOT 2.3 (H) 08/29/2018 0240   GFRNONAA 19 (L) 08/31/2018 0524   GFRNONAA 49 (L) 02/13/2018 1142   GFRAA 22 (L) 08/31/2018 0524   GFRAA 57 (L) 02/13/2018 1142   Lipase     Component Value Date/Time   LIPASE 30 08/13/2018 1926       Studies/Results: No results found.  Anti-infectives: Anti-infectives (From admission, onward)   Start     Dose/Rate Route Frequency Ordered Stop   08/23/18 1400  piperacillin-tazobactam (ZOSYN) IVPB 2.25 g     2.25 g 100 mL/hr over 30 Minutes Intravenous Every 8 hours 08/23/18 0928 08/30/18 2223   08/21/18 1200  piperacillin-tazobactam (ZOSYN) IVPB 3.375 g  Status:  Discontinued     3.375 g 12.5 mL/hr over 240 Minutes Intravenous Every 8 hours 08/21/18 1102 08/23/18 0928   08/14/18 1000  hydroxychloroquine (PLAQUENIL) tablet 200 mg  Status:  Discontinued    Note to Pharmacy:  Take 1 tablet by mouth daily Monday through Friday only     200 mg Oral Once per day on Mon Tue Wed Thu Fri 08/14/18 0742 08/21/18 1147   08/13/18 2200  doxycycline (VIBRA-TABS) tablet 100 mg  Status:  Discontinued     100 mg Oral Every 12 hours 08/13/18 2134 08/13/18 2135   08/13/18 2200  doxycycline (VIBRAMYCIN) 100 mg in sodium chloride 0.9 % 250 mL IVPB  Status:  Discontinued     100 mg 125 mL/hr over 120 Minutes Intravenous Every 12 hours 08/13/18 2135 08/19/18 1702   08/13/18 2145  cefTRIAXone (ROCEPHIN) 1 g in sodium chloride 0.9 % 100 mL IVPB  Status:  Discontinued     1 g 200 mL/hr over 30 Minutes Intravenous Every 24 hours 08/13/18 2134 08/14/18 0743       Assessment/Plan POD10, s/p ex lapw/SBR, LOA, appendectomyfor SBO&ischemic/necroticbowel, 4/30 - PT -Extubated.Offpressors.Out of unit.AppreciateTRHhelp in her care -ContTPNtill inc intake. Pre-alb <5 (5/5).Hopefully can be  weaned soon.DYS1dietand as tolerated per speech - Bloody BM5/7. Sq Heparin held.no bloody BMs since then - BID WTD dressing changes. - cont JP drain for now. d/c when output decreases  - completed zosyn x10 days - Path benign. 58cm SB w/ hemorrhage and prominent necrosis w/ viable margins.Appendix w/ fibrous obliteration and patchy acute serositis. - Mobilize and IS  Acute on chronic renal failure - Cr downtrending 2.34 -Defer fluid balance to TRH.Foley out  ICD/CAD/CHF -per cardiology.  -on amio  Acute Respiratory Failure - Off vent.Improving. Per TRH. Some wheezing this AM  ABL Anemia -given 1 unit PRBCs for Hg 6.7 (5/9), post-transfusion hgb 7.9. hgb 7.5 today - no signs of external bleeding this morning, no blood in stool. VSS  FEN -TPN,DYS1 (AAT per speech) VTE - SCDs, Mobilize as tolerating with PT. ID -zosyn 4/30>>5/9. WBC normalized, afebrile Foley - Removed 5/7 Follow up - Dr. Marlou Starks   Plan: Abdominal exam benign. Continue Dysphagia 1 diet, ok to advance if cleared by speech therapy. No signs of bleeding today, continue to monitor H/H.   LOS: 18 days    Wellington Hampshire , [facility] Surgery 08/31/2018, 8:37 AM Pager: [phone] Mon-Thurs 7:00 am-4:30 pm Fri 7:00 am -11:30 AM Sat-Sun 7:00 am-11:30 am

## 2018-08-31 NOTE — Progress Notes (Signed)
PROGRESS NOTE    [name]  XAJ:[id number] DOB: [birth year] DOA: 08/13/2018 PCP: [name], MD  Brief Narrative: PCCM Pick up 5/5 Is a chronically ill 78 year old female with history of chronic systolic CHF, nonsustained V. tach, pericardial effusion, CAD, AICD, chronic diastolic CHF, chronic kidney disease stage III, hypertension, rheumatoid arthritis, anemia of chronic disease was admitted to [facility] on 4/22 with small bowel obstruction, also started on antibiotics for possible pneumonia -Failed conservative management developed high-grade small bowel obstruction by 4/28 -4/29 transferred to [facility] -4/30 went to the OR underwent ex lap with lysis of adhesion, bowel resection for ischemic bowel, returned to the ICU on ventilator, hypotensive -4/30 shocked for VT, started on pressors -5/1: Developed progressive renal failure oliguria -5/2: Off pressors -5/3: Extubated General surgery following, currently on TNA, awaiting return of bowel function 5/5: Transferred to [place]:   Septic shock -Resolved -Status post VDRF and pressor support, extubated 5/3, weaned off pressors 5/2. Stable  Small bowel obstruction, ischemic colon/postop ileus -Failed conservative management of bowel obstruction at [facility] -On 4/30 underwent ex lap for SBO and small bowel resection of ischemic necrotic bowel -On IV Zosyn since 4/30, total 10 days per CCS. -On TNA awaiting return of bowel function.  NG tube discontinued. -Per general surgery -Wound VAC has been removed 5/6.  Wound is currently open and fascia intact. -DC Foley 5/7 -Tolerating dysphagia 1 diet recommended by speech therapy. -Had 2 bloody BMs on 5/7 but none since. -Ongoing diarrhea since 5/7.  As per RN, increase stool output and watery yesterday.  No abdominal pain.  No leukocytosis.  Low index of suspicion for C. difficile.  Added probiotics.  Monitor closely. -CCS follow-up appreciated  and feel that bloody BM was likely from staple line.  Plan to DC JP drain if output decreases.  Diarrhea -Low index of suspicion for C. difficile.  Added Florastor.  Monitor for now.  Likely related to resolving postop ileus.  Acute lower GI bleed -As per general surgery, felt to be due to staple line -DC subcutaneous heparin.  Has SCDs for DVT prophylaxis. -Seems to have resolved.  Continue to monitor closely.  Acute kidney injury on CKD stage II -Baseline creatinine around 1.2 -Creatinine worsened to 3.2, in the setting of septic shock, ATN, ischemic bowel -Creatinine improved.  Creatinine has slowly drifted down to 2.3.  Hypokalemia resolved -Continue TNA, renal dosing medications.   -Improving.  Monitor BMP closely.  Hypophosphatemia/hypomagnesemia Replaced  NAG metabolic acidosis Likely due to diarrhea and mild hyperchloremia.  Continue oral sodium bicarbonate supplements.  Bicarb has improved to 20.  Chronic systolic CHF, AICD -EF of 30% with by V ICD -Off diuretics, ACE inhibitor in the setting of AKI and septic shock -Not overtly volume overloaded.  Continue to hold off diuretics for now.  No wheezing noted on my evaluation.  Acute on chronic anemia -Acute on chronic -Hemoglobin has further drifted down to 6.7 on 5/9 in the absence of overt bleeding.  Transfusing 1 unit of PRBC. -Anemia panel reviewed: Suggestive of anemia of chronic disease. -Hemoglobin improved posttransfusion to 7.9 > 7.5.  Follow daily CBCs.  Paroxysmal atrial fibrillation -Status post DC cardioversion on 4/29, currently in sinus rhythm - No anticoagulation currently due to GI bleed issues. -Cardiology switched her from IV to oral amiodarone on 5/7.  I discussed with Dr. Katharina Caper prior to sign off on 5/7, continue amiodarone, low-dose carvedilol initiated, may restart Lasix when able. -  Outpatient follow-up with Dr. [name].  Severe protein calorie malnutrition -Advancing to dysphagia 1 diet.   History of CAD -Carvedilol 3.125 mg twice daily started 5/7.  Thrombocytopenia Platelet count up to 135  DVT prophylaxis: SCDs Code Status: Full code Family Communication: I called spouse on 5/8, updated care and answered questions. Disposition Plan: Patient not yet medically ready for discharge.  Will likely need SNF for rehab when stable for discharge.  As per clinical social worker, patient will need COVID 19 testing prior to DC to SNF.  Will order this test approximately 48 hours prior to potential discharge.  No clinical suspicion for COVID-19 at this time.  Consultants:   Cardiology  PCCM  Surgery     Procedures:   -DC cardioversion 4/29  -4/30 :Ex lap with lysis of adhesion, bowel resection for ischemic bowel,  Patient has right IJ, PPM, Foley catheter (discontinued 5/7) and abdominal wound VAC (discontinued 5/6).  Antimicrobials:    Subjective: No pain reported.  No dyspnea.  As per RN, worsening diarrhea since yesterday afternoon.  Tolerating diet.  Objective: Vitals:   08/30/18 2302 08/31/18 0440 08/31/18 0813 08/31/18 1313  BP: (!) 134/52 (!) 125/45 (!) 108/44 (!) 116/43  Pulse: 83 81 81   Resp: (!) 25 20 (!) 21   Temp: 98.5 F (36.9 C) 98.3 F (36.8 C) 98.1 F (36.7 C) 98.6 F (37 C)  TempSrc: Oral Oral Oral Axillary  SpO2: 99% 97% 97%   Weight:      Height:        Intake/Output Summary (Last 24 hours) at 08/31/2018 1619 Last data filed at 08/31/2018 0830 Gross per 24 hour  Intake 1421.35 ml  Output 155 ml  Net 1266.35 ml   Filed Weights   08/24/18 0500 08/28/18 0422 08/30/18 0515  Weight: 72.8 kg 71.2 kg 74.3 kg    Examination:  General exam: Early, frail chronically ill female, very hard of hearing lying comfortably propped up in bed.  Does not appear in any distress.  Oral mucosa moist. Respiratory system: Clear to auscultation.  No increased work of breathing.  Stable. Cardiovascular system: S1 & S2 heard, RRR.  No JVD or murmurs.   No pedal edema.  Telemetry personally reviewed: V paced rhythm.  Stable.  Gastrointestinal system: Abdomen is soft, nondistended, nontender, surgical site dressing clean and dry.  Normal bowel sounds.  Surgical wound as per picture in CCS progress note from 5/9 >large midline incision, some granulation tissue admixed with fibrinous exudate.  Flexi-Seal bag this morning was empty and likely emptied by staff this morning.    Central nervous system: Alert and oriented x2.  No focal deficits. Extremities: right IJ PICC line.  Right forearm dressing clean and dry. Skin: No rashes, lesions or ulcers Psychiatry:  Mood & affect appropriate.     Data Reviewed:   CBC: Recent Labs  Lab 08/26/18 0500 08/28/18 0458 08/28/18 1500 08/29/18 0240 08/30/18 0321 08/30/18 2205 08/31/18 0524  WBC 10.2 13.4* 12.1* 11.2* 10.4  --  10.1  NEUTROABS 8.5*  --   --   --   --   --   --   HGB 8.3* 8.0* 7.8* 7.5* 6.7* 7.9* 7.5*  HCT 24.1* 22.8* 23.1* 21.3* 19.1* 22.9* 22.0*  MCV 77.0* 77.6* 79.4* 77.5* 77.3*  --  78.9*  PLT 92* 102* 104* 108* 119*  --  387*   Basic Metabolic Panel: Recent Labs  Lab 08/26/18 0500 08/27/18 0513  08/28/18 0458 08/28/18 1500 08/29/18 0240 08/30/18  0321 08/31/18 0524  NA 147* 143   < > 139 139 140 141 142  K 2.6* 2.8*   < > 3.7 3.6 4.2 4.0 4.1  CL 115* 112*   < > 115* 115* 115* 113* 110  CO2 19* 17*   < > 15* 14* 15* 19* 20*  GLUCOSE 131* 135*   < > 128* 151* 123* 125* 121*  BUN 63* 61*   < > 68* 68* 71* 74* 81*  CREATININE 3.02* 2.56*   < > 2.50* 2.51* 2.53* 2.42* 2.34*  CALCIUM 7.6* 7.8*   < > 7.8* 7.7* 8.0* 8.0* 8.1*  MG 2.2 1.7  --  1.6* 2.0  --   --  1.8  PHOS 3.4 2.2*  --  2.4* 2.5  --   --  3.3   < > = values in this interval not displayed.   GFR: Estimated Creatinine Clearance: 19.2 mL/min (A) (by C-G formula based on SCr of 2.34 mg/dL (H)). Liver Function Tests: Recent Labs  Lab 08/26/18 0500 08/27/18 0513 08/28/18 0458 08/29/18 0240  AST 60* 54* 51*  40  ALT 13 15 18 19   ALKPHOS 226* 220* 223* 204*  BILITOT 2.1* 2.0* 2.2* 2.3*  PROT 4.9* 4.6* 4.5* 4.7*  ALBUMIN 1.2* 1.1* 1.1* 1.1*   Cardiac Enzymes: No results for input(s): CKTOTAL, CKMB, CKMBINDEX, TROPONINI in the last 168 hours. CBG: Recent Labs  Lab 08/30/18 2025 08/31/18 0003 08/31/18 0455 08/31/18 0829 08/31/18 1209  GLUCAP 208* 129* 128* 127* 141*   Lipid Profile: No results for input(s): CHOL, HDL, LDLCALC, TRIG, CHOLHDL, LDLDIRECT in the last 72 hours. Urine analysis:    Component Value Date/Time   COLORURINE AMBER (A) 08/13/2018 1849   APPEARANCEUR CLOUDY (A) 08/13/2018 1849   LABSPEC 1.016 08/13/2018 1849   PHURINE 5.0 08/13/2018 1849   GLUCOSEU NEGATIVE 08/13/2018 1849   HGBUR MODERATE (A) 08/13/2018 1849   BILIRUBINUR NEGATIVE 08/13/2018 1849   KETONESUR NEGATIVE 08/13/2018 1849   PROTEINUR 30 (A) 08/13/2018 1849   UROBILINOGEN 0.2 07/17/2013 2240   NITRITE NEGATIVE 08/13/2018 1849   LEUKOCYTESUR TRACE (A) 08/13/2018 1849    No results found for this or any previous visit (from the past 240 hour(s)).       Radiology Studies: No results found.      Scheduled Meds: . amiodarone  200 mg Oral Daily  . carvedilol  3.125 mg Oral BID WC  . chlorhexidine  15 mL Mouth Rinse BID  . feeding supplement (ENSURE ENLIVE)  237 mL Oral TID BM  . insulin aspart  0-9 Units Subcutaneous Q4H  . mouth rinse  15 mL Mouth Rinse q12n4p  . mupirocin ointment   Nasal BID  . pantoprazole (PROTONIX) IV  40 mg Intravenous QHS  . saccharomyces boulardii  250 mg Oral BID  . sodium bicarbonate  1,300 mg Oral BID   Continuous Infusions: . TPN ADULT (ION) 80 mL/hr at 08/30/18 1739  . TPN ADULT (ION)       LOS: 18 days    Vernell Leep, MD, FACP, [facility]. [facility]  To contact the attending provider between 7A-7P or the covering provider during after hours 7P-7A, please log into the web site [email or web address] and access using universal [place] password for  that web site. If you do not have the password, please call the hospital operator.

## 2018-08-31 NOTE — Progress Notes (Signed)
Pt noted with increased wheezes, new +2 pitting on trunk & thighs.  MD paged

## 2018-08-31 NOTE — Progress Notes (Signed)
PHARMACY - ADULT TOTAL PARENTERAL NUTRITION CONSULT NOTE   Pharmacy Consult for TPN Indication: s/p ex lap with SBR for SBO and ischemic/necroticbowel  Patient Measurements: Height: 5\' 4"  (162.6 cm) Weight: 163 lb 12.8 oz (74.3 kg) IBW/kg (Calculated) : 54.7 TPN AdjBW (KG): 64.8 Body mass index is 28.12 kg/m.  Assessment:  78 year old female with PMH significant for anemia, CAD s/p AICD placement, breast cancer, CKD III, CHF, HLD, impaired hearing, and RA. She presented to [facility] on 4/23 with CAP and SBO. CT on 4/28 show persistent SBO and patient was transferred to [facility] for surgery. On 4/30, an ex lap, lysis of adhesions and small bowel resection was performed. As of 5/4, Patient has been ~12 days without nutrition and pharmacy was consulted 5/4 for TPN with plan for continued bowel rest and NG until bowel function returns.  GI: NGT removed 5/5, JP drain 35 mls. PPI IV. Pre-albumin <5. No N/V. +Flatus. LBM 5/10 x3 (loose stool, flexiseal leaking). Dyphagia 1 diet initiated 5.9. Endo: No history of diabetes. CBGs mostly controlled, 1 CBG out of range at 208. Insulin requirements in the past 24 hours: 8 units Lytes: K 4.1, Phos 3.3, Mg 1.8; Corr Ca 10.4 (alb 1.1), CO2 up to 20 s/p Bicarb drip (maximizing acetate in TPN) and bicarb tablet added. Renal: Scr 2.34 (down), BUN 81, off CRRT - no plan for dialysis. UOP 0.1 mL/kg/hr + 3 unmeasured occurences.  Pulm: Extubated on 5/3. RA Cards: Amiodarone drip at 30 mg/hr (s/p VT arrest earlier in admission); V-paced at 80, BP soft this AM. Hepatobil: AST trending back down (40); T Bili down to 2.3; ALT wnl. TG 78. Neuro: oriented to person only, complains of abdominal pain ID: WBC down to normal limits, afebrile on Zosyn 5/2>>present.   TPN Access: CVC triple lumen placed 4/30 TPN start date: 5/4 Nutritional Goals (per RD recommendation on 5/4, re-estimated post extubation): Kcal: 2000-2200 kcal Protein:  100-120 grams Fluid:  >/= 2 L/day    Goal TPN rate is 80 ml/hr (provides 120 g of protein, 270 g of dextrose, and 60 g of lipids which provides 2000 kCals per day, meeting 100% of patient needs)  Current Nutrition:  Dysphagia 1 diet - charted consumed ~25% of a meals  Ensure Enlive (charted 2 yesterday)  TPN at goal  Plan: Continue TPN at 80 mL/hr as still not at >=50% intake with enteral diet.   This TPN provides  100g of protein, 270g of dextrose, and 60g of lipids which provides 1374 kCals per day, meeting 69% of patient's protein and calorie needs. Electrolytes in TPN: Per discussion with Dr. Algis Liming, ok to add back lytes in TPN cautiously- continue current reduced amounts. Maximize acetate. Add MVI to TPN - trace elements are on back order, will only place in TPN on Monday, Wednesday and Friday Continue sensitive SSI and adjust as needed Monitor TPN labs Follow-up renal function and diuresis. Follow-up toleration of diet/intake amounts and ability to wean TPN  Sloan Leiter, PharmD, BCPS, BCCCP Clinical Pharmacist Please refer to [facility] for [place] numbers 08/31/2018, 7:46 AM

## 2018-09-01 ENCOUNTER — Inpatient Hospital Stay (HOSPITAL_COMMUNITY): Payer: Medicare Other

## 2018-09-01 ENCOUNTER — Telehealth: Payer: Self-pay

## 2018-09-01 ENCOUNTER — Encounter: Payer: Medicare Other | Admitting: *Deleted

## 2018-09-01 DIAGNOSIS — J9601 Acute respiratory failure with hypoxia: Secondary | ICD-10-CM

## 2018-09-01 LAB — COMPREHENSIVE METABOLIC PANEL
ALT: 24 U/L (ref 0–44)
AST: 47 U/L — ABNORMAL HIGH (ref 15–41)
Albumin: 1.2 g/dL — ABNORMAL LOW (ref 3.5–5.0)
Alkaline Phosphatase: 312 U/L — ABNORMAL HIGH (ref 38–126)
Anion gap: 12 (ref 5–15)
BUN: 95 mg/dL — ABNORMAL HIGH (ref 8–23)
CO2: 23 mmol/L (ref 22–32)
Calcium: 8.4 mg/dL — ABNORMAL LOW (ref 8.9–10.3)
Chloride: 108 mmol/L (ref 98–111)
Creatinine, Ser: 2.52 mg/dL — ABNORMAL HIGH (ref 0.44–1.00)
GFR calc Af Amer: 20 mL/min — ABNORMAL LOW (ref >60)
GFR calc non Af Amer: 18 mL/min — ABNORMAL LOW (ref >60)
Glucose, Bld: 117 mg/dL — ABNORMAL HIGH (ref 70–99)
Potassium: 4.3 mmol/L (ref 3.5–5.1)
Sodium: 143 mmol/L (ref 135–145)
Total Bilirubin: 3.1 mg/dL — ABNORMAL HIGH (ref 0.3–1.2)
Total Protein: 5.3 g/dL — ABNORMAL LOW (ref 6.5–8.1)

## 2018-09-01 LAB — BLOOD GAS, ARTERIAL
Acid-base deficit: 3.6 mmol/L — ABNORMAL HIGH (ref 0.0–2.0)
Bicarbonate: 21.1 mmol/L (ref 20.0–28.0)
Delivery systems: POSITIVE
Drawn by: 30136
Expiratory PAP: 5
FIO2: 40
Inspiratory PAP: 10
O2 Saturation: 98.8 %
Patient temperature: 98.2
RATE: 8 resp/min
pCO2 arterial: 39.1 mmHg (ref 32.0–48.0)
pH, Arterial: 7.351 (ref 7.350–7.450)
pO2, Arterial: 139 mmHg — ABNORMAL HIGH (ref 83.0–108.0)

## 2018-09-01 LAB — GLUCOSE, CAPILLARY
Glucose-Capillary: 111 mg/dL — ABNORMAL HIGH (ref 70–99)
Glucose-Capillary: 114 mg/dL — ABNORMAL HIGH (ref 70–99)
Glucose-Capillary: 140 mg/dL — ABNORMAL HIGH (ref 70–99)
Glucose-Capillary: 156 mg/dL — ABNORMAL HIGH (ref 70–99)
Glucose-Capillary: 158 mg/dL — ABNORMAL HIGH (ref 70–99)
Glucose-Capillary: 164 mg/dL — ABNORMAL HIGH (ref 70–99)

## 2018-09-01 LAB — DIFFERENTIAL
Abs Immature Granulocytes: 0.08 10*3/uL — ABNORMAL HIGH (ref 0.00–0.07)
Basophils Absolute: 0 10*3/uL (ref 0.0–0.1)
Basophils Relative: 0 %
Eosinophils Absolute: 0.1 10*3/uL (ref 0.0–0.5)
Eosinophils Relative: 1 %
Immature Granulocytes: 1 %
Lymphocytes Relative: 11 %
Lymphs Abs: 1.2 10*3/uL (ref 0.7–4.0)
Monocytes Absolute: 1.2 10*3/uL — ABNORMAL HIGH (ref 0.1–1.0)
Monocytes Relative: 10 %
Neutro Abs: 8.6 10*3/uL — ABNORMAL HIGH (ref 1.7–7.7)
Neutrophils Relative %: 77 %

## 2018-09-01 LAB — CBC
HCT: 22.1 % — ABNORMAL LOW (ref 36.0–46.0)
Hemoglobin: 7.3 g/dL — ABNORMAL LOW (ref 12.0–15.0)
MCH: 27 pg (ref 26.0–34.0)
MCHC: 33 g/dL (ref 30.0–36.0)
MCV: 81.9 fL (ref 80.0–100.0)
Platelets: 162 10*3/uL (ref 150–400)
RBC: 2.7 MIL/uL — ABNORMAL LOW (ref 3.87–5.11)
RDW: 22.2 % — ABNORMAL HIGH (ref 11.5–15.5)
WBC: 11.2 10*3/uL — ABNORMAL HIGH (ref 4.0–10.5)
nRBC: 0.2 % (ref 0.0–0.2)

## 2018-09-01 LAB — TRIGLYCERIDES: Triglycerides: 81 mg/dL (ref <150)

## 2018-09-01 LAB — PREALBUMIN: Prealbumin: 8.6 mg/dL — ABNORMAL LOW (ref 18–38)

## 2018-09-01 LAB — PHOSPHORUS: Phosphorus: 3.8 mg/dL (ref 2.5–4.6)

## 2018-09-01 LAB — MAGNESIUM: Magnesium: 1.8 mg/dL (ref 1.7–2.4)

## 2018-09-01 MED ORDER — IPRATROPIUM-ALBUTEROL 0.5-2.5 (3) MG/3ML IN SOLN
3.0000 mL | Freq: Four times a day (QID) | RESPIRATORY_TRACT | Status: DC
  Administered 2018-09-01 – 2018-09-05 (×14): 3 mL via RESPIRATORY_TRACT
  Filled 2018-09-01 (×14): qty 3

## 2018-09-01 MED ORDER — TRAVASOL 10 % IV SOLN
INTRAVENOUS | Status: DC
  Administered 2018-09-01: 17:00:00 via INTRAVENOUS
  Filled 2018-09-01: qty 1200

## 2018-09-01 MED ORDER — ALBUTEROL SULFATE (2.5 MG/3ML) 0.083% IN NEBU: 2.5000 mg | INHALATION_SOLUTION | RESPIRATORY_TRACT | Status: DC | PRN

## 2018-09-01 MED ORDER — FUROSEMIDE 10 MG/ML IJ SOLN
80.0000 mg | Freq: Once | INTRAMUSCULAR | Status: AC
  Administered 2018-09-01: 80 mg via INTRAVENOUS
  Filled 2018-09-01: qty 8

## 2018-09-01 MED ORDER — METHYLPREDNISOLONE SODIUM SUCC 125 MG IJ SOLR
60.0000 mg | Freq: Two times a day (BID) | INTRAMUSCULAR | Status: DC
  Administered 2018-09-01 – 2018-09-02 (×2): 60 mg via INTRAVENOUS
  Filled 2018-09-01 (×2): qty 2

## 2018-09-01 MED ORDER — FUROSEMIDE 10 MG/ML IJ SOLN
40.0000 mg | Freq: Two times a day (BID) | INTRAMUSCULAR | Status: DC
  Administered 2018-09-01 – 2018-09-09 (×16): 40 mg via INTRAVENOUS
  Filled 2018-09-01 (×16): qty 4

## 2018-09-01 MED ORDER — ALBUTEROL SULFATE (2.5 MG/3ML) 0.083% IN NEBU
2.5000 mg | INHALATION_SOLUTION | Freq: Three times a day (TID) | RESPIRATORY_TRACT | Status: DC
  Administered 2018-09-01 (×2): 2.5 mg via RESPIRATORY_TRACT
  Filled 2018-09-01 (×2): qty 3

## 2018-09-01 MED ORDER — FUROSEMIDE 10 MG/ML IJ SOLN
INTRAMUSCULAR | Status: AC
  Filled 2018-09-01: qty 4

## 2018-09-01 NOTE — Care Management Important Message (Signed)
Important Message  Patient Details  Name: [name] MRN: [id number] Date of Birth: [birth year]   Medicare Important Message Given:  Yes Patient was unable to sign.  Unsigned copy left    Coreena Rubalcava 09/01/2018, 4:11 PM

## 2018-09-01 NOTE — Progress Notes (Signed)
Pt taken off BiPAP per shift report. Placed on 2L oxygen. Pt in bed resting comfortably. Will continue to monitor.

## 2018-09-01 NOTE — Progress Notes (Signed)
PT Cancellation Note  Patient Details Name: MAANSI WIKE MRN: [id number] DOB: July 01, 1940   Cancelled Treatment:    Reason Eval/Treat Not Completed: Medical issues which prohibited therapy.  Newly put back on BIPAP just prior to arrival.  Will see 5/12 as able. 09/01/2018  Donnella Sham, [place] [facility] [phone]  (pager) [phone]  (office)   Tessie Fass Jaiyla Granados 09/01/2018, 6:06 PM

## 2018-09-01 NOTE — Progress Notes (Addendum)
NAME:  [name], MRN:  [id number], DOB:  05-27-1940, LOS: 26 ADMISSION DATE:  08/13/2018, CONSULTATION DATE:  4/20 REFERRING MD:  Renne Crigler , CHIEF COMPLAINT:  Vent management    Brief History   78 year old female with significant cardiac history that is post exploratory laparotomy for small bowel obstruction with associated ischemic bowel on 4/30, return to the intensive care on mechanical ventilator. ICU course complicated by vent and pressor dependence, which resolved in a few days after surgery. She was able to be extubated and transferred out of ICU. ABX completed for peritonitis. Remained on TNA for nutrition. She had been very slowly progressing, until 5/11 when she developed worsening dyspnea. She was noted to be significantly volume and weight up so was given diuresis with minimal effect. PCCM re-consulted.   Past Medical History  Congestive heart failure with EF 30%, nonsustained VT, pericardial effusion CAD w/ prior AICD, diastolic heart failure, CKD stage III, hypertension, rheumatoid arthritis, anemia of chronic disease.  Significant Hospital Events   4/22 Admitted to [facility] with small bowel obstruction.  Also started on antibiotics for possible PNA 4/28 ongoing high-grade bowel obstruction 4/29 arrived at [facility] 4/30 Ex-lap with lysis of adhesion, bowel resection for ischemic bowel return to ICU on ventilator 4/30 Shocked for VT 5/01 Remains on 40 mcg levophed, CVP 4, AKI, less JP drainage.   5/02 On/off vasopressin, AKI/oliguria 5/03 Extubated 5/11 PCCM re-consulted for dyspnea.   Consults:  Radiology 4/29  Procedures:  R Fem ALine 4/30 >>   Significant Diagnostic Tests:  CT imaging 4/29 >> Persistent small bowel obstruction  Micro Data:  BCx2 4/22 >> neg UC 4/22 >> neg  Antimicrobials:  Doxy 4/22 >> 4/28 Zosyn 4/30 >> 5/9  Interim history/subjective:  Wheezing and dyspnea onset this morning. Treated with 80mg  lasix and steroid initiation.  RN reports ~  450-500 UOP post lasix.   Objective   Blood pressure (!) 116/59, pulse 84, temperature 98.2 F (36.8 C), temperature source Oral, resp. rate 18, height 5\' 4"  (1.626 m), weight 76.7 kg, SpO2 100 %.        Intake/Output Summary (Last 24 hours) at 09/01/2018 1448 Last data filed at 09/01/2018 1153 Gross per 24 hour  Intake 1011.49 ml  Output 785 ml  Net 226.49 ml   Filed Weights   08/30/18 0515 08/31/18 1841 09/01/18 0933  Weight: 74.3 kg 76.4 kg 76.7 kg    Examination:  General: chronically ill appearing, frail elderly female lying in bed  Neuro:  Awake / alert, MAE, generalized weakness, very HOH HEENT:  [place]/AT, No JVD noted, PERRL, upper airway wheezing Cardiovascular:  V-paced rhythm in 70's,  no MRG Lungs: mild abdominal accessory muscle use, referred upper airway wheezing  Abdomen:  Soft, protuberant, midline dressing clean, JP drain with serosanguinous drainage Musculoskeletal:  No acute deformity Skin:  Intact, MMM   Resolved Hospital Problem list   RLL PNA - treated with 7 days doxycycline  Shock (ABL anemia and sepsis 2/2 ischemic bowel) Peritonitis   Assessment & Plan:    Small Bowel Obstruction s/p Exploratory Laparotomy -initially admitted 4/22 at [facility], failed conservative therapy . Was found to have ischemic bowel, which was resected in OR 4/30. Peritonitis secondary this as well, completed 10 day course of azithromycin.  P: - TPN for nutrition - Surgery following - Mobilize as tolerated.  - DYS 1 diet to begin per speech.  - Dressing per [place] - BiPAP now for 2-4 hours then PRN  Dyspnea -no  clear evidence of hypoxia. CXR from 5/10 concerning for interstitial edema. Weight up quite a bit from time of admission (~22lbs). Just completed a 10 day course of zosyn. Wheeze on exam. She does have history of chronic bronchitis, this is note felt to be in exacerbation.  P: - Supplemental O2 to keep SpO2 > 90% - Diuresis as tolerated - Repeat CXR now - continue  scheduled bronchodilators for now, reassess in am for quick reduction of dosing - No further ABX at this time.   Acute on Chronic Systolic / Diastolic CHF  -LVEF 02-77%, diffuse hypokinesis 08/19/18, AICD in place. She is now up 22 pounds from admission and had not been diuresed due to shock, AKI.  AF/Flutter, VT -s/p cardioversion for wide complex tachycardia 4/30 pm P: - Agree with diuresis as she will tolerate.  - Continue amiodarone, coreg for now. May need to DC coreg if wheeze felt to be secondary to bronchospasm.   GIB felt secondary to surgical staple line P: - follow hemoglobin and continue to monitor.   Acute on chronic renal failure  CKD III  P: - per primary  RA on Plaquenil -must also consider RA related vocal cord changes but upper airway noise was not present early in admit P: Per primary  Best practice:  Diet: TPN, DYS1 Pain/Anxiety/Delirium protocol (if indicated): N/A VAP protocol (if indicated): N/A DVT prophylaxis: SCD's, GI prophylaxis: Home PPI Glucose control: n/a Mobility: bedrest  Code Status: Full Code  Family Communication: Per Primary  Disposition: Progressive care   Noe Gens, NP-C [place] Pgr: [phone] or if no answer [phone] 09/01/2018, 3:23 PM  Attending note: I have seen and examined the patient. History, labs and imaging reviewed.  78 year old with prolonged hospital stay for small bowel obstruction status post laparotomy, vent dependent respiratory failure, heart failure, nonsustained VT.  PCCM reconsulted on 5/11 for worsening dyspnea with wheeze, respiratory distress.  Blood pressure (!) 116/59, pulse 80, temperature 98.2 F (36.8 C), temperature source Oral, resp. rate (!) 22, height 5\' 4"  (1.626 m), weight 76.7 kg, SpO2 100 %. Gen:      No acute distress HEENT: Chronically ill-appearing Neck:     No masses; no thyromegaly Lungs:    Bilateral wheeze more pronounced in the upper airway CV:          Regular rate and rhythm; no murmurs Abd:      + bowel sounds; soft, non-tender; no palpable masses, no distension Ext:    No edema; adequate peripheral perfusion Skin:      Warm and dry; no rash Neuro: Somnolent, arousable  Labs reviewed, significant for Sodium 143, potassium 4.3, BUN/creatinine 95/2.52 WBC 11.2, hemoglobin 7.3, platelets 162  Imaging Chest x-ray 5/10-mild diffuse interstitial opacities, cardiomegaly.  I have reviewed the images personally.  Assessment/plan: 78 year old with respiratory distress, wheezing Suspect volume overload as she is 10 L positive.  May have a component of bronchospasm Upper airway wheezing noted.  But have low suspicion for airway edema  Already started on steroids.  Continue nebulizer, BiPAP Follow chest x-ray.  Check ABG Agree with diuresis.  Follow urine output with diuresis We will continue to follow as she is at risk for deterioration and need for intubation.  Marshell Garfinkel MD [place] [facility] [facility] Pager [phone] If no answer call [phone]  09/01/2018, 4:29 PM

## 2018-09-01 NOTE — Telephone Encounter (Signed)
Pt has been in the hospital for a week that is why the transmission was missed. I let the caregiver know as soon as the pt is released to send the transmission. The caregiver agreed.

## 2018-09-01 NOTE — NC FL2 (Signed)
[place] MEDICAID FL2 LEVEL OF CARE SCREENING TOOL     IDENTIFICATION  Patient Name: [name] Birthdate: 07/10/1940 Sex: female Admission Date (Current Location): 08/13/2018  [facility] and [place] Number:  Herbalist and Address:  [facility], [place] [address] [place], [place] [address]      Provider Number: [id number]  Attending Physician Name and Address:  Modena Jansky, MD  Relative Name and Phone Number:       Current Level of Care: Hospital Recommended Level of Care: [place] Prior Approval Number:    Date Approved/Denied:   PASRR Number: [id number]  Discharge Plan: SNF    Current Diagnoses: Patient Active Problem List   Diagnosis Date Noted  . Protein-calorie malnutrition, severe 08/25/2018  . Wide-complex tachycardia ([place])   . Acute renal failure superimposed on stage 3 chronic kidney disease ([place]) 08/18/2018  . SBO (small bowel obstruction) ([place]) 08/14/2018  . Pressure injury of skin 08/14/2018  . CAP (community acquired pneumonia) 08/13/2018  . AKI (acute kidney injury) ([place]) 08/10/2017  . Hyperkalemia 08/10/2017  . Acute blood loss anemia 09/20/2016  . Ischemic cardiomyopathy 09/20/2016  . Chronic combined systolic and diastolic CHF (congestive heart failure) ([place]) 09/20/2016  . CKD (chronic kidney disease), stage IV ([place]) 09/20/2016  . History of breast cancer 08/02/2016  . Primary osteoarthritis of both knees 08/02/2016  . Primary osteoarthritis of both feet 08/02/2016  . History of anemia 07/17/2016  . History of CHF (congestive heart failure) 06/04/2016  . History of chronic kidney disease 06/04/2016  . Symptomatic anemia 06/04/2016  . Elevated sedimentation rate 03/03/2016  . Idiopathic chronic gout of multiple sites without tophus 03/03/2016  . Total knee replacement status, right 03/03/2016  . High risk medication use 03/03/2016  . Chronic kidney disease (CKD) stage G3b/A1, moderately  decreased glomerular filtration rate (GFR) between 30-44 mL/min/1.73 square meter and albuminuria creatinine ratio less than 30 mg/g (HCC) 02/01/2016  . CAD S/P remote PCI- no details 09/02/2014  . Cardiomyopathy, ischemic-EF 30-35% March 2015 09/02/2014  . Diastolic dysfunction-grade 2 09/02/2014  . CHF exacerbation ([place]) 08/28/2014  . Acute on chronic combined systolic and diastolic congestive heart failure ([place]) 08/28/2014  . Iron deficiency anemia 08/20/2013  . Malnutrition of moderate degree ([place]) 07/21/2013  . PNA (pneumonia) 07/19/2013  . HCAP (healthcare-associated pneumonia) 07/17/2013  . Sepsis ([place]) 07/17/2013  . ARF (acute renal failure) ([place]) 07/17/2013  . Rheumatoid arthritis ([place]) 07/04/2013  . Hypokalemia 07/03/2013  . Chest pain 07/03/2013  . Biventricular ICD in place (MDT 2014) 11/06/2012  . Anemia of chronic disease   . Breast cancer ([place])   . Hypertension   . Dyslipidemia 05/24/2009  . Chronic systolic heart failure ([place]) 05/24/2009  . Primary osteoarthritis of both hands 08/11/2007    Orientation RESPIRATION BLADDER Height & Weight     Self, Time, Situation, Place  O2(Nasal Cannula 2L) External catheter, Incontinent(placed 08/28/18) Weight: 169 lb 1.5 oz (76.7 kg) Height:  5\' 4"  (162.6 cm)  BEHAVIORAL SYMPTOMS/MOOD NEUROLOGICAL BOWEL NUTRITION STATUS      Incontinent Diet( TPN, please check d/c summary, diet subject to change)  AMBULATORY STATUS COMMUNICATION OF NEEDS Skin   Extensive Assist Verbally Skin abrasions(open wound on lower right arm, guaze dressing, change daily.)                       Personal Care Assistance Level of Assistance  Dressing, Feeding, Bathing Bathing Assistance: Maximum assistance Feeding  assistance: Limited assistance Dressing Assistance: Maximum assistance     Functional Limitations Info  Sight, Hearing, Speech Sight Info: Adequate Hearing Info: Adequate Speech Info: Adequate    SPECIAL CARE FACTORS FREQUENCY  PT  (By licensed PT), OT (By licensed OT)     PT Frequency: 5x OT Frequency: 5x            Contractures Contractures Info: Not present    Additional Factors Info  Code Status, Allergies, Isolation Precautions Code Status Info: Full Code Allergies Info: Macrobid Nitrofurantoin Macrocrystal     Isolation Precautions Info: MRSA     Current Medications (09/01/2018):  This is the current hospital active medication list Current Facility-Administered Medications  Medication Dose Route Frequency Provider Last Rate Last Dose  . acetaminophen (TYLENOL) tablet 650 mg  650 mg Oral Q6H PRN Autumn Messing III, MD   650 mg at 08/29/18 0211   Or  . acetaminophen (TYLENOL) suppository 650 mg  650 mg Rectal Q6H PRN Autumn Messing III, MD      . albuterol (PROVENTIL) (2.5 MG/3ML) 0.083% nebulizer solution 2.5 mg  2.5 mg Nebulization TID Modena Jansky, MD   2.5 mg at 09/01/18 9983  . amiodarone (PACERONE) tablet 200 mg  200 mg Oral Daily Nahser, Wonda Cheng, MD   200 mg at 09/01/18 1000  . carvedilol (COREG) tablet 3.125 mg  3.125 mg Oral BID WC Nahser, Wonda Cheng, MD   3.125 mg at 09/01/18 1000  . chlorhexidine (PERIDEX) 0.12 % solution 15 mL  15 mL Mouth Rinse BID Erick Colace, NP   15 mL at 09/01/18 1001  . feeding supplement (ENSURE ENLIVE) (ENSURE ENLIVE) liquid 237 mL  237 mL Oral TID BM Hongalgi, Anand D, MD   237 mL at 09/01/18 1001  . insulin aspart (novoLOG) injection 0-9 Units  0-9 Units Subcutaneous Q4H Erick Colace, NP   1 Units at 09/01/18 0040  . MEDLINE mouth rinse  15 mL Mouth Rinse q12n4p Erick Colace, NP   15 mL at 08/31/18 1338  . mupirocin ointment (BACTROBAN) 2 %   Nasal BID Erick Colace, NP      . ondansetron (ZOFRAN-ODT) disintegrating tablet 4 mg  4 mg Oral Q6H PRN Autumn Messing III, MD       Or  . ondansetron [facility]) injection 4 mg  4 mg Intravenous Q6H PRN Autumn Messing III, MD      . oxyCODONE (Oxy IR/ROXICODONE) immediate release tablet 5-10 mg  5-10 mg Oral Q4H PRN  Jillyn Ledger, PA-C   5 mg at 08/31/18 2124  . pantoprazole (PROTONIX) injection 40 mg  40 mg Intravenous QHS Autumn Messing III, MD   40 mg at 08/31/18 2121  . saccharomyces boulardii (FLORASTOR) capsule 250 mg  250 mg Oral BID Vernell Leep D, MD   250 mg at 09/01/18 1000  . TPN ADULT (ION)   Intravenous Continuous TPN Priscella Mann, RPH 80 mL/hr at 09/01/18 0300    . TPN ADULT (ION)   Intravenous Continuous TPN Corinda [facility]         Discharge Medications: Please see discharge summary for a list of discharge medications.  Relevant Imaging Results:  Relevant Lab Results:   Additional Information SSN: [id number]  Eileen Stanford, LCSW

## 2018-09-01 NOTE — Progress Notes (Signed)
  Speech Language Pathology Treatment: Dysphagia  Patient Details Name: [name] MRN: [id number] DOB: [birth year] Today's Date: 09/01/2018 Time: 1345-1410 SLP Time Calculation (min) (ACUTE ONLY): 25 min  Assessment / Plan / Recommendation Clinical Impression  Patient seen to address dysphagia goals with lunch meal. (Dys 1, thin liquids). MD has requested reassessment of patient's swallow as concern is for her getting volume overload from PO's as well as TNA and would like to determine if patient could be weaned off of TNA. Although patient's SpO2 percentage remained at 100% and RR was maintained at 18-22, patient with inspiratory wheezing and did exhibit delayed throat clearing during PO intake. Plan for completing MBS to objectively test patient's swallow function.   Update: prior to SLP filing this note, was informed that MD determined that patient was not ready to complete MBS today as she will need to be on BiPap.    HPI HPI: Patient is a 78 y.o. female with PMH: chronic CHF, CAD with remote MI, HTN, hyperlipidemia, CKD stage III, chronic anemia, GERD, who underwent ex-lap for SBO on 4/30. She was transferred to ICU post-op for severe hypotension and shock, was intubated from 4/30 to 5/3.      SLP Plan  Continue with current plan of care;MBS       Recommendations  Diet recommendations: Thin liquid;Dysphagia 1 (puree) Liquids provided via: Cup;Straw Medication Administration: Crushed with puree Supervision: Full supervision/cueing for compensatory strategies;Staff to assist with self feeding;Trained caregiver to feed patient Compensations: Slow rate;Small sips/bites;Minimize environmental distractions Postural Changes and/or Swallow Maneuvers: Seated upright 90 degrees;Upright 30-60 min after meal                Oral Care Recommendations: Oral care BID Follow up Recommendations: Skilled Nursing facility;24 hour supervision/assistance SLP Visit Diagnosis: Dysphagia,  unspecified (R13.10) Plan: Continue with current plan of care;MBS       GO                [name] 09/01/2018, 4:42 PM   [name], MA, [facility] Pager: [phone]

## 2018-09-01 NOTE — Care Management Important Message (Signed)
Important Message  Patient Details  Name: [name] MRN: [id number] Date of Birth: 1940-11-22   Medicare Important Message Given:  Yes    Marcy Bogosian Montine Circle 09/01/2018, 4:10 PM

## 2018-09-01 NOTE — Telephone Encounter (Signed)
Spoke with patient to remind of missed remote transmission 

## 2018-09-01 NOTE — Progress Notes (Signed)
Pt's abdominal dressing change done without difficulty.  Breathing tx given s/t tight/noisy breath sounds.  O2 sat 100% on 2L [place].  Pt has had 375 mL urine output since IV lasix given in am. Will continue to monitor.

## 2018-09-01 NOTE — Progress Notes (Signed)
Nutrition Follow-up  DOCUMENTATION CODES:   Severe malnutrition in context of acute illness/injury  INTERVENTION:   Continue TPN @ 80 ml/hr until diet advanced    Continue Ensure Enlive po TID, each supplement provides 350 kcal and 20 grams of protein once diet advanced   Continue Magic cup TID (on tray with DYS 1) each supplement provides 290 kcal and 9 grams of protein once diet advances  NUTRITION DIAGNOSIS:   Severe Malnutrition related to acute illness(SBO) as evidenced by energy intake < or equal to 50% for > or equal to 5 days, mild fat depletion, moderate muscle depletion.  Ongoing  GOAL:   Patient will meet greater than or equal to 90% of their needs  Met with TPN  MONITOR:   Diet advancement, Weight trends, Labs, Vent status, TF tolerance, Skin, I & O's  REASON FOR ASSESSMENT:   Ventilator    ASSESSMENT:   Patient with PMH significant for anemia, CAD s/p AICD placement, breast cancer, CKD III, CHF, HLD, impaired hearing, and RA. Presented to [facility] on 4/23 with CAP and SBO.   4/28- CT scan showed persistent SBO, tx to [facility] for surgery 4/30- ex lap, lysis of adhesions, small bowel resection due to ischemic changes 5/3- extubated 5/4- TPN started  5/5- NGT removed  5/6- diet advanced to full liquid  5/9- diet advanced DYS 1, thin liquids   RD working remotely.  Spoke with RN via phone who reports pt is now NPO and currently requiring BiPAP. She consumed 50% of her breakfast this am with 50% of an Ensure. Meal completions over pt's last eight meals charted as 15-50%. RN states pt needs a lot of encouragement to finish meals and supplements.   Pt tolerating TPN @ 80 ml/hr provides 2000 kcal and 120 grams protein. Recommend continuation until PO intake progresses to at least 50%.   Weigh noted to increase from 71.2 kg on 5/7 to 76.7 kg today. Will continue to monitor trends.   I/O: +9,493 ml since 4/27 UOP: 775 ml x 24 hrs   Medications: SS novolog,  solumedrol Labs: CBG [id number]  Diet Order:   Diet Order            Diet NPO time specified  Diet effective now              EDUCATION NEEDS:   Not appropriate for education at this time  Skin:  Skin Assessment: Skin Integrity Issues: Skin Integrity Issues:: Stage II, Incisions Stage II: buttocks Incisions: open abdomen  Last BM:  5/11- rectal tube  Height:   Ht Readings from Last 1 Encounters:  08/21/18 '5\' 4"'  (1.626 m)    Weight:   Wt Readings from Last 1 Encounters:  09/01/18 76.7 kg    Ideal Body Weight:  54.5 kg  BMI:  Body mass index is 29.02 kg/m.  Estimated Nutritional Needs:   Kcal:  2000-2200 kcal  Protein:  100-120 grams  Fluid:  >/= 2 L/day   Mariana Single RD, LDN Clinical Nutrition Pager # - [phone]

## 2018-09-01 NOTE — Progress Notes (Signed)
PROGRESS NOTE    [name]  VOH:[id number] DOB: [birth year] DOA: 08/13/2018 PCP: [name], MD  Brief Narrative: PCCM Pick up 5/5 Is a chronically ill 78 year old female with history of chronic systolic CHF, nonsustained V. tach, pericardial effusion, CAD, AICD, chronic diastolic CHF, chronic kidney disease stage III, hypertension, rheumatoid arthritis, anemia of chronic disease was admitted to [facility] on 4/22 with small bowel obstruction, also started on antibiotics for possible pneumonia -Failed conservative management developed high-grade small bowel obstruction by 4/28 -4/29 transferred to [facility] -4/30 went to the OR underwent ex lap with lysis of adhesion, bowel resection for ischemic bowel, returned to the ICU on ventilator, hypotensive -4/30 shocked for VT, started on pressors -5/1: Developed progressive renal failure oliguria -5/2: Off pressors -5/3: Extubated General surgery following, currently on TNA, awaiting return of bowel function 5/5: Transferred to [place]:   Septic shock -Resolved -Status post VDRF and pressor support, extubated 5/3, weaned off pressors 5/2. Stable  Small bowel obstruction, ischemic colon/postop ileus -Failed conservative management of bowel obstruction at [facility] -On 4/30 underwent ex lap for SBO and small bowel resection of ischemic necrotic bowel -Completed 10 days of IV Zosyn -On TNA awaiting return of bowel function.  NG tube discontinued. -Wound VAC has been removed 5/6.  Wound is currently open and fascia intact.  Wet-to-dry dressing. -Tolerating dysphagia 1 diet recommended by speech therapy. -Had 2 bloody BMs on 5/7 but none since and felt to be bleeding from staple line.  Still has JP drain. -Noted diarrhea since 5/7, Flexi-Seal placed, probiotics added, low index of suspicion for C. difficile in the absence of abdominal pain or leukocytosis. -Diarrhea improving.   Diarrhea -Low index  of suspicion for C. difficile.  Added Florastor.  Monitor for now.  Likely related to resolving postop ileus.  Improving.  Acute lower GI bleed -As per general surgery, felt to be due to staple line -DC subcutaneous heparin.  Has SCDs for DVT prophylaxis. -Seems to have resolved.  Continue to monitor closely.  Acute kidney injury on CKD stage II -Baseline creatinine around 1.2 -Creatinine worsened to 3.2, in the setting of septic shock, ATN, ischemic bowel -Creatinine improved.  Creatinine has slowly drifted down to 2.3.  Hypokalemia resolved -Continue TNA, renal dosing medications.   -Creatinine has slightly gone up from 2.3-2.5 likely related to a dose of IV Lasix on 5/10.  Continue to follow BMP closely.  Hypophosphatemia/hypomagnesemia Replaced  NAG metabolic acidosis Likely due to diarrhea and mild hyperchloremia.  Added sodium bicarbonate tablets.  Resolved.  Discontinue sodium bicarbonate tablets.  Acute on chronic systolic CHF, AICD -EF of 30% with by V ICD -Off diuretics, ACE inhibitor in the setting of AKI and septic shock -On 5/10 evening, worsening dyspnea and wheezing, chest x-ray suggestive of pulmonary edema, treated with a dose of IV Lasix 40 mg, still wheezing this morning, significant thigh/sacral edema.Weight up by 22 pounds since 4/22. -IV Lasix 80 mg x 1.  Hopefully can advance diet and wean off TNA which is contributing to volume.  Acute on chronic anemia -Acute on chronic -Hemoglobin has further drifted down to 6.7 on 5/9 in the absence of overt bleeding.  Transfusing 1 unit of PRBC. -Anemia panel reviewed: Suggestive of anemia of chronic disease. -Hemoglobin improved posttransfusion to 7.9 > 7.5 >7.3.  Follow daily CBCs.  Paroxysmal atrial fibrillation -Status post DC cardioversion on 4/29, currently in sinus rhythm - No anticoagulation currently due to GI  bleed issues. -Cardiology switched her from IV to oral amiodarone on 5/7.  I discussed with Dr. Katharina Caper  prior to sign off on 5/7, continue amiodarone, low-dose carvedilol initiated, may restart Lasix when able. -Outpatient follow-up with Dr. [name]. -AV/V paced rhythm.  Occasional transient NSVT.  Severe protein calorie malnutrition -Tolerating dysphagia 1 diet.  History of CAD -Carvedilol 3.125 mg twice daily started 5/7.  Thrombocytopenia Resolved today.  DVT prophylaxis: SCDs Code Status: Full code Family Communication: I called spouse on 5/8, updated care and answered questions. Disposition Plan: Patient not yet medically ready for discharge.  Will likely need SNF for rehab when stable for discharge.  As per clinical social worker, patient will need 2 negative COVID 19 testing prior to DC to SNF.  Ordered testing 5/11.  No clinical suspicion for COVID-19 at this time.  Consultants:   Cardiology  PCCM  Surgery     Procedures:   -DC cardioversion 4/29  -4/30 :Ex lap with lysis of adhesion, bowel resection for ischemic bowel,  Patient has right IJ, PPM, Foley catheter (discontinued 5/7) and abdominal wound VAC (discontinued 5/6).  Antimicrobials:    Subjective: Patient denies complaints.  When asked about dyspnea indicates that it is "fair".  However noted to be wheezing and mildly dyspneic.  Objective: Vitals:   09/01/18 0300 09/01/18 0800 09/01/18 0821 09/01/18 0933  BP: (!) 101/54 (!) 127/46    Pulse: 85     Resp: (!) 22     Temp: 98.1 F (36.7 C) 97.9 F (36.6 C)    TempSrc: Oral Oral    SpO2: 98% 98% 98%   Weight:    76.7 kg  Height:        Intake/Output Summary (Last 24 hours) at 09/01/2018 1054 Last data filed at 09/01/2018 0930 Gross per 24 hour  Intake 1011.49 ml  Output 535 ml  Net 476.49 ml   Filed Weights   08/30/18 0515 08/31/18 1841 09/01/18 0933  Weight: 74.3 kg 76.4 kg 76.7 kg    Examination:  General exam: Early, frail chronically ill female, very hard of hearing lying propped up in bed with mild audible wheezing. Respiratory  system: Diminished breath sounds in the bases with few basal crackles.  Scattered bilateral few rhonchi.  Mild increased work of breathing. Cardiovascular system: S1 & S2 heard, RRR.  No JVD or murmurs.  No pedal edema but has 1+ pitting bilateral thigh and sacral edema (examined with female RN).  Telemetry personally reviewed: V paced rhythm.  Occasional 6-7 beat NSVT.  Gastrointestinal system: Abdomen is soft, nondistended, nontender, surgical site dressing clean and dry.  Normal bowel sounds.  Surgical wound as per picture in CCS progress note from 5/9 >large midline incision, some granulation tissue admixed with fibrinous exudate.  Flexi-Seal bag this morning was empty.    Central nervous system: Alert and oriented x2.  No focal deficits. Extremities: right IJ PICC line.  Right forearm dressing clean and dry. Skin: No rashes, lesions or ulcers Psychiatry:  Mood & affect appropriate.     Data Reviewed:   CBC: Recent Labs  Lab 08/26/18 0500  08/28/18 1500 08/29/18 0240 08/30/18 0321 08/30/18 2205 08/31/18 0524 09/01/18 0628  WBC 10.2   < > 12.1* 11.2* 10.4  --  10.1 11.2*  NEUTROABS 8.5*  --   --   --   --   --   --  8.6*  HGB 8.3*   < > 7.8* 7.5* 6.7* 7.9* 7.5* 7.3*  HCT 24.1*   < >  23.1* 21.3* 19.1* 22.9* 22.0* 22.1*  MCV 77.0*   < > 79.4* 77.5* 77.3*  --  78.9* 81.9  PLT 92*   < > 104* 108* 119*  --  135* 162   < > = values in this interval not displayed.   Basic Metabolic Panel: Recent Labs  Lab 08/27/18 0513  08/28/18 0458 08/28/18 1500 08/29/18 0240 08/30/18 0321 08/31/18 0524 09/01/18 0628  NA 143   < > 139 139 140 141 142 143  K 2.8*   < > 3.7 3.6 4.2 4.0 4.1 4.3  CL 112*   < > 115* 115* 115* 113* 110 108  CO2 17*   < > 15* 14* 15* 19* 20* 23  GLUCOSE 135*   < > 128* 151* 123* 125* 121* 117*  BUN 61*   < > 68* 68* 71* 74* 81* 95*  CREATININE 2.56*   < > 2.50* 2.51* 2.53* 2.42* 2.34* 2.52*  CALCIUM 7.8*   < > 7.8* 7.7* 8.0* 8.0* 8.1* 8.4*  MG 1.7  --  1.6* 2.0   --   --  1.8 1.8  PHOS 2.2*  --  2.4* 2.5  --   --  3.3 3.8   < > = values in this interval not displayed.   GFR: Estimated Creatinine Clearance: 18.1 mL/min (A) (by C-G formula based on SCr of 2.52 mg/dL (H)). Liver Function Tests: Recent Labs  Lab 08/26/18 0500 08/27/18 0513 08/28/18 0458 08/29/18 0240 09/01/18 0628  AST 60* 54* 51* 40 47*  ALT 13 15 18 19 24   ALKPHOS 226* 220* 223* 204* 312*  BILITOT 2.1* 2.0* 2.2* 2.3* 3.1*  PROT 4.9* 4.6* 4.5* 4.7* 5.3*  ALBUMIN 1.2* 1.1* 1.1* 1.1* 1.2*   Cardiac Enzymes: No results for input(s): CKTOTAL, CKMB, CKMBINDEX, TROPONINI in the last 168 hours. CBG: Recent Labs  Lab 08/31/18 1718 08/31/18 2043 09/01/18 0036 09/01/18 0409 09/01/18 0837  GLUCAP 153* 143* 140* 111* 114*   Lipid Profile: Recent Labs    09/01/18 0627  TRIG 81   Urine analysis:    Component Value Date/Time   COLORURINE AMBER (A) 08/13/2018 1849   APPEARANCEUR CLOUDY (A) 08/13/2018 1849   LABSPEC 1.016 08/13/2018 1849   PHURINE 5.0 08/13/2018 1849   GLUCOSEU NEGATIVE 08/13/2018 1849   HGBUR MODERATE (A) 08/13/2018 1849   BILIRUBINUR NEGATIVE 08/13/2018 1849   KETONESUR NEGATIVE 08/13/2018 1849   PROTEINUR 30 (A) 08/13/2018 1849   UROBILINOGEN 0.2 07/17/2013 2240   NITRITE NEGATIVE 08/13/2018 1849   LEUKOCYTESUR TRACE (A) 08/13/2018 1849    No results found for this or any previous visit (from the past 240 hour(s)).       Radiology Studies: Dg Chest Port 1 View  Result Date: 08/31/2018 CLINICAL DATA:  Hypoxia EXAM: PORTABLE CHEST 1 VIEW COMPARISON:  08/25/2018 FINDINGS: Unchanged AP portable examination with mild, diffuse interstitial pulmonary opacity, likely edema, and small, layering bilateral pleural effusions. Cardiomegaly with left chest multi lead pacer defibrillator. Right neck vascular catheter. IMPRESSION: Unchanged AP portable examination with mild, diffuse interstitial pulmonary opacity, likely edema, and small, layering bilateral  pleural effusions. Cardiomegaly with left chest multi lead pacer defibrillator. Right neck vascular catheter. Electronically Signed   By: Eddie Candle M.D.   On: 08/31/2018 19:20        Scheduled Meds: . albuterol  2.5 mg Nebulization TID  . amiodarone  200 mg Oral Daily  . carvedilol  3.125 mg Oral BID WC  . chlorhexidine  15 mL Mouth  Rinse BID  . feeding supplement (ENSURE ENLIVE)  237 mL Oral TID BM  . insulin aspart  0-9 Units Subcutaneous Q4H  . mouth rinse  15 mL Mouth Rinse q12n4p  . mupirocin ointment   Nasal BID  . pantoprazole (PROTONIX) IV  40 mg Intravenous QHS  . saccharomyces boulardii  250 mg Oral BID   Continuous Infusions: . TPN ADULT (ION) 80 mL/hr at 09/01/18 0300  . TPN ADULT (ION)       LOS: 19 days    Vernell Leep, MD, FACP, [facility]. [facility]  To contact the attending provider between 7A-7P or the covering provider during after hours 7P-7A, please log into the web site [email or web address] and access using universal [place] password for that web site. If you do not have the password, please call the hospital operator.

## 2018-09-01 NOTE — Progress Notes (Addendum)
Addendum  Patient received IV Lasix 80 mg IV x1 this morning, has put out 375 mL of urine but continues to have wheezing and dyspnea.  No stridor.  ? AECOPD/AECHF/Tracheal edema>stenosis d/t recent intubation  Ordered stat portable chest x-ray and will review Started IV Solu-Medrol 60 mg every 12 hours. ? Trial of racemic epinephrine Given recent past history of ventilatory support, concerned regarding decline.  Consulted PCCM to evaluate.  I called spouse via phone, updated care and answered questions.  He wished that he would be able to come to the hospital to see his spouse but unfortunately due to current hospital visitation restrictions, he is not allowed to.  Vernell Leep, MD, FACP, [facility]. [facility]  To contact the attending provider between 7A-7P or the covering provider during after hours 7P-7A, please log into the web site [email or web address] and access using universal [place] password for that web site. If you do not have the password, please call the hospital operator.

## 2018-09-01 NOTE — Progress Notes (Signed)
[facility] Progress Note  11 Days Post-Op  Subjective: CC: no complaints Patient reports she is doing "pretty fair".  Abdominal exam is benign, NT helping patient to eat breakfast this AM. No blood noted in flexiseal tubing this AM. Patient on supplemental O2 with some wheezing this AM.   Objective: Vital signs in last 24 hours: Temp:  [97.9 F (36.6 C)-98.7 F (37.1 C)] 97.9 F (36.6 C) (05/11 0800) Pulse Rate:  [83-88] 85 (05/11 0300) Resp:  [22-25] 22 (05/11 0300) BP: (101-130)/(42-55) 127/46 (05/11 0800) SpO2:  [95 %-98 %] 98 % (05/11 0821) Weight:  [76.4 kg] 76.4 kg (05/10 1841) Last BM Date: 09/01/18  Intake/Output from previous day: 05/10 0701 - 05/11 0700 In: 891.5 [P.O.:200; I.V.:691.5] Out: 30 [Drains:30] Intake/Output this shift: Total I/O In: -  Out: 525 [Urine:525]  PE: Gen: Alert, NAD, pleasant HEENT: EOM's intact, pupils equal and round Pulm: slightly tachypneic, 2L via [place] with some expiratory wheezing Skin: warm and dry Abd: Soft, NT/ND, +BS,open midline incision with some granulation tissue at base and on walls, fibrinous exudate cover with some increased drainage, JP drain with serosanguinous output   Lab Results:  Recent Labs    08/31/18 0524 09/01/18 0628  WBC 10.1 11.2*  HGB 7.5* 7.3*  HCT 22.0* 22.1*  PLT 135* 162   BMET Recent Labs    08/31/18 0524 09/01/18 0628  NA 142 143  K 4.1 4.3  CL 110 108  CO2 20* 23  GLUCOSE 121* 117*  BUN 81* 95*  CREATININE 2.34* 2.52*  CALCIUM 8.1* 8.4*   PT/INR No results for input(s): LABPROT, INR in the last 72 hours. CMP     Component Value Date/Time   NA 143 09/01/2018 0628   K 4.3 09/01/2018 0628   CL 108 09/01/2018 0628   CO2 23 09/01/2018 0628   GLUCOSE 117 (H) 09/01/2018 0628   BUN 95 (H) 09/01/2018 0628   CREATININE 2.52 (H) 09/01/2018 0628   CREATININE 1.07 (H) 02/13/2018 1142   CALCIUM 8.4 (L) 09/01/2018 0628   PROT 5.3 (L) 09/01/2018 0628   ALBUMIN 1.2 (L)  09/01/2018 0628   AST 47 (H) 09/01/2018 0628   ALT 24 09/01/2018 0628   ALKPHOS 312 (H) 09/01/2018 0628   BILITOT 3.1 (H) 09/01/2018 0628   GFRNONAA 18 (L) 09/01/2018 0628   GFRNONAA 49 (L) 02/13/2018 1142   GFRAA 20 (L) 09/01/2018 0628   GFRAA 57 (L) 02/13/2018 1142   Lipase     Component Value Date/Time   LIPASE 30 08/13/2018 1926       Studies/Results: Dg Chest Port 1 View  Result Date: 08/31/2018 CLINICAL DATA:  Hypoxia EXAM: PORTABLE CHEST 1 VIEW COMPARISON:  08/25/2018 FINDINGS: Unchanged AP portable examination with mild, diffuse interstitial pulmonary opacity, likely edema, and small, layering bilateral pleural effusions. Cardiomegaly with left chest multi lead pacer defibrillator. Right neck vascular catheter. IMPRESSION: Unchanged AP portable examination with mild, diffuse interstitial pulmonary opacity, likely edema, and small, layering bilateral pleural effusions. Cardiomegaly with left chest multi lead pacer defibrillator. Right neck vascular catheter. Electronically Signed   By: Eddie Candle M.D.   On: 08/31/2018 19:20    Anti-infectives: Anti-infectives (From admission, onward)   Start     Dose/Rate Route Frequency Ordered Stop   08/23/18 1400  piperacillin-tazobactam (ZOSYN) IVPB 2.25 g     2.25 g 100 mL/hr over 30 Minutes Intravenous Every 8 hours 08/23/18 0928 08/30/18 2230   08/21/18 1200  piperacillin-tazobactam (ZOSYN) IVPB 3.375 g  Status:  Discontinued     3.375 g 12.5 mL/hr over 240 Minutes Intravenous Every 8 hours 08/21/18 1102 08/23/18 0928   08/14/18 1000  hydroxychloroquine (PLAQUENIL) tablet 200 mg  Status:  Discontinued    Note to Pharmacy:  Take 1 tablet by mouth daily Monday through Friday only     200 mg Oral Once per day on Mon Tue Wed Thu Fri 08/14/18 6948 08/21/18 1147   08/13/18 2200  doxycycline (VIBRA-TABS) tablet 100 mg  Status:  Discontinued     100 mg Oral Every 12 hours 08/13/18 2134 08/13/18 2135   08/13/18 2200  doxycycline  (VIBRAMYCIN) 100 mg in sodium chloride 0.9 % 250 mL IVPB  Status:  Discontinued     100 mg 125 mL/hr over 120 Minutes Intravenous Every 12 hours 08/13/18 2135 08/19/18 1702   08/13/18 2145  cefTRIAXone (ROCEPHIN) 1 g in sodium chloride 0.9 % 100 mL IVPB  Status:  Discontinued     1 g 200 mL/hr over 30 Minutes Intravenous Every 24 hours 08/13/18 2134 08/14/18 0743       Assessment/Plan POD11, s/p ex lapw/SBR, LOA, appendectomyfor SBO&ischemic/necroticbowel, 4/30 - PT -Extubated.Offpressors.Out of unit.AppreciateTRHhelp in her care -ContTPNtill inc intake. Pre-alb 8.6 this AM, improving.Hopefully can be weaned soon.DYS1dietand as tolerated per speech - Bloody BM5/7.[place] held.no bloody BMs since then - increased to TID WTD dressing changes with increased drainage - JP with 30 cc of SS output - will discuss with MD if ok to remove - completed zosyn x10 days, WBC 11.2, patient afebrile - Path benign. 58cm SB w/ hemorrhage and prominent necrosis w/ viable margins.Appendix w/ fibrous obliteration and patchy acute serositis. - Mobilize and IS  Acute on chronic renal failure - Cr 2.52 this AM from 2.34 yesterday,Defer fluid balance to TRH.Foley out  ICD/CAD/CHF -per cardiology, on amio  Acute Respiratory Failure- Off vent.Improving. Per TRH. Some wheezing this AM  ABL Anemia-given 1 unit PRBCs for Hg 6.7 (5/9), post-transfusion hgb 7.9. hgb 7.3 today - no signs of external bleeding this morning, no blood in stool. VSS  FEN -TPN,DYS1 (AAT per speech) VTE - SCDs, Mobilize as tolerating with PT. ID -zosyn 4/30>>5/9. WBC 11.2, afebrile Foley - Removed 5/7 Follow up - Dr. Marlou Starks   Plan: Abdominal exam benign. Continue Dysphagia 1 diet, ok to advance if cleared by speech therapy. No signs of bleeding today, continue to monitor H/H. Mobilize as able  LOS: 19 days    Brigid Re , [facility] 09/01/2018, 9:30 AM Pager:  [phone] Consults: [phone]

## 2018-09-01 NOTE — Progress Notes (Signed)
OT Cancellation Note  Patient Details Name: MURIELLE STANG MRN: [id number] DOB: 05/09/1940   Cancelled Treatment:    Reason Eval/Treat Not Completed: Medical issues which prohibited therapy(just put back on BiPap respiration decreasing)  Merri Ray Buckley Bradly 09/01/2018, 4:54 PM   Hulda Humphrey OTR/L Acute Rehabilitation Services Pager: [phone] Office: [phone]

## 2018-09-01 NOTE — Progress Notes (Signed)
PHARMACY - ADULT TOTAL PARENTERAL NUTRITION CONSULT NOTE   Pharmacy Consult for TPN Indication: s/p ex lap with SBR for SBO and ischemic/necroticbowel  Patient Measurements: Height: 5\' 4"  (162.6 cm) Weight: 168 lb 6.9 oz (76.4 kg) IBW/kg (Calculated) : 54.7 TPN AdjBW (KG): 64.8 Body mass index is 28.91 kg/m.  Assessment:  78 year old female with PMH significant for anemia, CAD s/p AICD placement, breast cancer, CKD III, CHF, HLD, impaired hearing, and RA. She presented to [facility] on 4/23 with CAP and SBO. CT on 4/28 show persistent SBO and patient was transferred to [facility] for surgery. On 4/30, an ex lap, lysis of adhesions and small bowel resection was performed. As of 5/4, Patient has been ~12 days without nutrition and pharmacy was consulted 5/4 for TPN with plan for continued bowel rest and NG until bowel function returns.  GI: NGT removed 5/5, JP drain 30 mls. PPI IV. Pre-albumin <5 >>8.6. No N/V. +Flatus. LBM 5/10 x3 (loose stool, flexiseal leaking). Dyphagia 1 diet initiated 5/9. Endo: No history of diabetes. CBGs mostly controlled, 1 CBG out of range at 208. Insulin requirements in the past 24 hours: 8 units Lytes: K 4.3, Phos 3.8, Mg 1.8; Corr Ca 10.6 (alb 1.2), CO2 up to 23 s/p Bicarb drip (maximizing acetate in TPN) and bicarb tablet added. Renal: Scr 2.52 (up slightly), BUN 95, off CRRT - no plan for dialysis. UOP 1 unmeasured occurences.  Pulm: Extubated on 5/3. RA Cards: Amiodarone po (s/p VT arrest earlier in admission); V-paced at 80, BP ok. Hepatobil: AST 47; T Bili up to 3.1; ALT wnl. TG 81. Neuro: oriented to person only, complains of abdominal pain ID: WBC down to normal limits, afebrile off Zosyn.   TPN Access: CVC triple lumen placed 4/30 TPN start date: 5/4 Nutritional Goals (per RD recommendation on 5/4, re-estimated post extubation): Kcal: 2000-2200 kcal Protein:  100-120 grams Fluid:  >/= 2 L/day   Goal TPN rate is 80 ml/hr (provides 120 g of protein, 270 g of  dextrose, and 60 g of lipids which provides 2000 kCals per day, meeting 100% of patient needs)  Current Nutrition:  Dysphagia 1 diet - charted consumed 0% of a meals  Ensure Enlive (charted 2 yesterday)  TPN at goal  Plan: Continue TPN at 80 mL/hr as still not at >=50% intake with enteral diet.   This TPN provides  100g of protein, 270g of dextrose, and 60g of lipids which provides 1374 kCals per day, meeting 69% of patient's protein and calorie needs. Electrolytes in TPN: Per discussion with Dr. Algis Liming, ok to add back lytes in TPN cautiously- continue current reduced amounts. Maximize acetate. Add MVI to TPN - trace elements are on back order, will only place in TPN on Monday, Wednesday and Friday Continue sensitive SSI and adjust as needed Monitor TPN labs Follow-up renal function and diuresis. Follow-up toleration of diet/intake amounts and ability to wean TPN   Alanda Slim, PharmD, [facility] Clinical Pharmacist Please see AMION for all Pharmacists' Contact Phone Numbers 09/01/2018, 7:58 AM

## 2018-09-01 NOTE — Progress Notes (Signed)
Addendum  Reassessed patient at bedside.  Resting comfortably on BiPAP without distress.  No distant audible wheeze like earlier today.  States that she feels better.  Respiratory system: No increased work of breathing.  Few scattered bilateral upper airway wheezing but less prominent than earlier today.  Otherwise clear to auscultation.  Chest x-ray personally reviewed: Stable.  CCM input appreciated, BiPAP started couple of hours ago.  I discussed with CCM.  Discussed with RN, continue to monitor closely.  Vernell Leep, MD, FACP, [facility]. [facility]  To contact the attending provider between 7A-7P or the covering provider during after hours 7P-7A, please log into the web site [email or web address] and access using universal [place] password for that web site. If you do not have the password, please call the hospital operator.

## 2018-09-02 ENCOUNTER — Telehealth: Payer: Self-pay

## 2018-09-02 ENCOUNTER — Telehealth: Payer: Self-pay | Admitting: Internal Medicine

## 2018-09-02 ENCOUNTER — Inpatient Hospital Stay (HOSPITAL_COMMUNITY): Payer: Medicare Other

## 2018-09-02 LAB — BASIC METABOLIC PANEL
Anion gap: 16 — ABNORMAL HIGH (ref 5–15)
BUN: 117 mg/dL — ABNORMAL HIGH (ref 8–23)
CO2: 21 mmol/L — ABNORMAL LOW (ref 22–32)
Calcium: 8.7 mg/dL — ABNORMAL LOW (ref 8.9–10.3)
Chloride: 103 mmol/L (ref 98–111)
Creatinine, Ser: 2.52 mg/dL — ABNORMAL HIGH (ref 0.44–1.00)
GFR calc Af Amer: 20 mL/min — ABNORMAL LOW (ref >60)
GFR calc non Af Amer: 18 mL/min — ABNORMAL LOW (ref >60)
Glucose, Bld: 218 mg/dL — ABNORMAL HIGH (ref 70–99)
Potassium: 4 mmol/L (ref 3.5–5.1)
Sodium: 140 mmol/L (ref 135–145)

## 2018-09-02 LAB — GLUCOSE, CAPILLARY
Glucose-Capillary: 185 mg/dL — ABNORMAL HIGH (ref 70–99)
Glucose-Capillary: 198 mg/dL — ABNORMAL HIGH (ref 70–99)
Glucose-Capillary: 202 mg/dL — ABNORMAL HIGH (ref 70–99)
Glucose-Capillary: 204 mg/dL — ABNORMAL HIGH (ref 70–99)
Glucose-Capillary: 272 mg/dL — ABNORMAL HIGH (ref 70–99)
Glucose-Capillary: 283 mg/dL — ABNORMAL HIGH (ref 70–99)

## 2018-09-02 LAB — CBC
HCT: 22.4 % — ABNORMAL LOW (ref 36.0–46.0)
Hemoglobin: 7.3 g/dL — ABNORMAL LOW (ref 12.0–15.0)
MCH: 26.9 pg (ref 26.0–34.0)
MCHC: 32.6 g/dL (ref 30.0–36.0)
MCV: 82.7 fL (ref 80.0–100.0)
Platelets: 193 10*3/uL (ref 150–400)
RBC: 2.71 MIL/uL — ABNORMAL LOW (ref 3.87–5.11)
RDW: 22.3 % — ABNORMAL HIGH (ref 11.5–15.5)
WBC: 9 10*3/uL (ref 4.0–10.5)
nRBC: 0 % (ref 0.0–0.2)

## 2018-09-02 LAB — NOVEL CORONAVIRUS, NAA (HOSP ORDER, SEND-OUT TO REF LAB; TAT 18-24 HRS): SARS-CoV-2, NAA: NOT DETECTED

## 2018-09-02 MED ORDER — TRAVASOL 10 % IV SOLN
INTRAVENOUS | Status: DC
  Filled 2018-09-02: qty 1056

## 2018-09-02 MED ORDER — STERILE WATER FOR INJECTION IV SOLN
INTRAVENOUS | Status: AC
  Administered 2018-09-02: 17:00:00 via INTRAVENOUS
  Filled 2018-09-02: qty 710.4

## 2018-09-02 MED ORDER — METHYLPREDNISOLONE SODIUM SUCC 40 MG IJ SOLR
40.0000 mg | Freq: Two times a day (BID) | INTRAMUSCULAR | Status: DC
  Administered 2018-09-02 – 2018-09-07 (×10): 40 mg via INTRAVENOUS
  Filled 2018-09-02 (×10): qty 1

## 2018-09-02 MED ORDER — INSULIN ASPART 100 UNIT/ML ~~LOC~~ SOLN
0.0000 [IU] | Freq: Four times a day (QID) | SUBCUTANEOUS | Status: DC
  Administered 2018-09-02: 3 [IU] via SUBCUTANEOUS
  Administered 2018-09-02 (×2): 8 [IU] via SUBCUTANEOUS
  Administered 2018-09-03 (×2): 5 [IU] via SUBCUTANEOUS

## 2018-09-02 NOTE — Progress Notes (Signed)
PHARMACY - ADULT TOTAL PARENTERAL NUTRITION CONSULT NOTE   Pharmacy Consult for TPN Indication: s/p ex lap with SBR for SBO and ischemic/necroticbowel  Patient Measurements: Height: 5\' 4"  (162.6 cm) Weight: 160 lb 15 oz (73 kg) IBW/kg (Calculated) : 54.7 TPN AdjBW (KG): 64.8 Body mass index is 27.62 kg/m.  Assessment:  78 year old female with PMH significant for anemia, CAD s/p AICD placement, breast cancer, CKD III, CHF, HLD, impaired hearing, and RA. She presented to [facility] on 4/23 with CAP and SBO. CT on 4/28 show persistent SBO and patient was transferred to [facility] for surgery. On 4/30, an ex lap, lysis of adhesions and small bowel resection was performed. As of 5/4, Patient has been ~12 days without nutrition and pharmacy was consulted 5/4 for TPN with plan for continued bowel rest and NG until bowel function returns.  GI: NGT removed 5/5, JP drain removed 5/12. PPI IV. Pre-albumin <5 >>8.6. No N/V. +Flatus. LBM 5/11 (flexiseal in place, loose stool). Dyphagia 1 diet initiated 5/9. Endo: No history of diabetes. CBGs rising (150-200s last 12 hrs) with steroids given. Insulin requirements in the past 24 hours: 10 units Lytes: K 4, Phos 3.8, Mg 1.8; Corr Ca 10.9 (alb 1.2), CO2 21, s/p Bicarb drip (maximizing acetate in TPN) and bicarb tablet added. Renal: Scr 2.52 (unchanged), BUN up 117 in setting of IV Lasix and AKI, off CRRT - no plan for dialysis. UOP 2.1 mL/kg/hr. Net +7L.  Pulm: Extubated on 5/3. Required BiPAP yesterday, now back on 2L [place]. Steroids + Lasix. Cards: Amiodarone po (s/p VT arrest earlier in admission); V-paced at 70, BP ok. Hepatobil: AST 47; T Bili up to 3.1; ALT wnl. TG 81. Neuro: oriented to person only, complains of abdominal pain ID: WBC down to normal limits, afebrile off Zosyn.   TPN Access: CVC triple lumen placed 4/30 TPN start date: 5/4 Nutritional Goals (per RD recommendation on 5/11): Kcal: 2000-2200 kcal Protein:  100-120 grams Fluid:  >/= 2 L/day    Goal TPN rate is 60 ml/hr (provides 106 g of protein, 288 g of dextrose, and 60 g of lipids which provides 2002 kCals per day, meeting 100% of patient needs) -- reduced protein to lower end of goal with high BUN and concentrated to reduce volume.   Current Nutrition:  Dysphagia 1, thin liquids diet + Magic cup - charted consumed 15-50% of a meals  Ensure Enlive (charted 3 yesterday)  TPN at goal  Plan: Continue TPN at 60 mL/hr as still not at >=50% intake with enteral diet.   This TPN provides  106g of protein, 288g of dextrose, and 60g of lipids which provides 2002 kCals per day, meeting 100% of patient's protein and calorie needs. Electrolytes in TPN: Per discussion with Dr. Algis Liming, ok to add back lytes in TPN cautiously- continue current reduced amounts. Remove calcium. Maximize acetate. Add MVI to TPN - trace elements are on back order, will only place in TPN on Monday, Wednesday and Friday Increase to moderate SSI with addition of steroids and adjust as needed Monitor TPN labs Follow-up renal function and diuresis. Follow-up toleration of diet/intake amounts and ability to wean TPN Re-evaluate volume status - TPN currently concentrated   Sloan Leiter, PharmD, BCPS, BCCCP Clinical Pharmacist Please refer to [facility] for [place] numbers 09/02/2018, 7:16 AM

## 2018-09-02 NOTE — Telephone Encounter (Signed)
Patient currently in [facility]. Can not send transmission this month

## 2018-09-02 NOTE — Telephone Encounter (Signed)
Left message for patient to remind of missed remote transmission.  

## 2018-09-02 NOTE — Progress Notes (Signed)
NAME:  [name], MRN:  [id number], DOB:  [birth year], LOS: 75 ADMISSION DATE:  08/13/2018, CONSULTATION DATE:  4/20 REFERRING MD:  Renne Crigler , CHIEF COMPLAINT:  Vent management    Brief History   78 year old female with significant cardiac history that is post exploratory laparotomy for small bowel obstruction with associated ischemic bowel on 4/30, return to the intensive care on mechanical ventilator. ICU course complicated by vent and pressor dependence, which resolved in a few days after surgery. She was able to be extubated and transferred out of ICU. ABX completed for peritonitis. Remained on TNA for nutrition. She had been very slowly progressing, until 5/11 when she developed worsening dyspnea. She was noted to be significantly volume and weight up so was given diuresis with minimal effect. PCCM re-consulted.   Past Medical History  Congestive heart failure with EF 30%, nonsustained VT, pericardial effusion CAD w/ prior AICD, diastolic heart failure, CKD stage III, hypertension, rheumatoid arthritis, anemia of chronic disease.  Significant Hospital Events   4/22 Admitted to [facility] with small bowel obstruction.  Also started on antibiotics for possible PNA 4/28 ongoing high-grade bowel obstruction 4/29 arrived at [facility] 4/30 Ex-lap with lysis of adhesion, bowel resection for ischemic bowel return to ICU on ventilator 4/30 Shocked for VT 5/01 Remains on 40 mcg levophed, CVP 4, AKI, less JP drainage.   5/02 On/off vasopressin, AKI/oliguria 5/03 Extubated 5/11 PCCM re-consulted for dyspnea. BiPAP + diuresis  5/12 Improved, I/O - 3.6L UOP / 3.5L neg for 24 hours  Consults:  Radiology 4/29  Procedures:  R Fem ALine 4/30 >>   Significant Diagnostic Tests:  CT imaging 4/29 >> Persistent small bowel obstruction  Micro Data:  BCx2 4/22 >> neg UC 4/22 >> neg  Antimicrobials:  Doxy 4/22 >> 4/28 Zosyn 4/30 >> 5/9  Interim history/subjective:  I/O - 3.6L UOP / 3.5L neg for 24  hours.  Pt off bipap > improved work of breathing   Objective   Blood pressure (!) 132/51, pulse 74, temperature 97.7 F (36.5 C), temperature source Oral, resp. rate 15, height 5\' 4"  (1.626 m), weight 73 kg, SpO2 100 %.    FiO2 (%):  [40 %] 40 %   Intake/Output Summary (Last 24 hours) at 09/02/2018 1028 Last data filed at 09/02/2018 0644 Gross per 24 hour  Intake 0 ml  Output 3170 ml  Net -3170 ml   Filed Weights   08/31/18 1841 09/01/18 0933 09/02/18 0650  Weight: 76.4 kg 76.7 kg 73 kg    Examination:  General: elderly female lying in bed in NAD HEENT: MM pink/moist, reduced upper airway noise Neuro: Awake, alert, HOH, MAE / generalized weakness CV: s1s2 rrr, no m/r/g PULM: even/non-labored, lungs bilaterally faint wheeze  JH:ERDE, non-tender, bsx4 active  Extremities: warm/dry, 2+ edema  Skin: no rashes or lesions  Resolved Hospital Problem list   RLL PNA - treated with 7 days doxycycline  Shock - ABL anemia and sepsis 2/2 ischemic bowel Peritonitis   Assessment & Plan:    Small Bowel Obstruction s/p Exploratory Laparotomy -initially admitted 4/22 at [facility], failed conservative therapy . Was found to have ischemic bowel, which was resected in OR 4/30. Peritonitis secondary this as well, completed 10 day course of azithromycin.  P: TPN / feeding per CCS CCS following, appreciate input  Mobilize / PT efforts  D1 diet per SLP  Dressing change per WOC   Dyspnea -no clear evidence of hypoxia. CXR from 5/10 concerning for interstitial edema. Weight  up quite a bit from time of admission (~22lbs). Just completed a 10 day course of zosyn. Wheeze on exam. She does have history of chronic bronchitis, this is note felt to be in exacerbation.  P: Wean O2 for sats 90-95% Continue diuresis as tolerated  Follow intermittent CXR  Consider reducing steroids to 40 mg QD and rapid wean off  Continue bronchodilators  BiPAP PRN for increased WOB   Acute on Chronic Systolic / Diastolic  CHF  -LVEF 71-16%, diffuse hypokinesis 08/19/18, AICD in place. She is now up 22 pounds from admission and had not been diuresed due to shock, AKI.  AF/Flutter, VT -s/p cardioversion for wide complex tachycardia 4/30 pm P: Continue diuresis as renal function permits  Amiodarone, Coreg per primary   GIB felt secondary to surgical staple line P: Follow H/H  Per primary   Acute on chronic renal failure  CKD III  P: Per primary   RA on Plaquenil -must also consider RA related vocal cord changes but upper airway noise was not present early in admit P: Per primary   Best practice:  Diet: TPN, DYS1 Pain/Anxiety/Delirium protocol (if indicated): N/A VAP protocol (if indicated): N/A DVT prophylaxis: SCD's, GI prophylaxis: Home PPI Glucose control: n/a Mobility: bedrest  Code Status: Full Code  Family Communication: Per Primary  Disposition: Progressive care.  PCCM will be available PRN.  Please call back if new needs arise.    Noe Gens, NP-C [facility] Pgr: [phone] or if no answer [phone] 09/02/2018, 10:28 AM

## 2018-09-02 NOTE — Progress Notes (Signed)
[facility] Progress Note  12 Days Post-Op  Subjective: CC: no complaints Patient denies pain currently. Abdominal exam benign. Per notes patient was placed on BiPAP yesterday but is off currently. No blood in flexiseal tubing.   Objective: Vital signs in last 24 hours: Temp:  [97.7 F (36.5 C)-98.2 F (36.8 C)] 97.7 F (36.5 C) (05/12 0507) Pulse Rate:  [69-84] 70 (05/12 0650) Resp:  [14-22] 15 (05/12 0650) BP: (112-124)/(50-68) 113/68 (05/12 0400) SpO2:  [96 %-100 %] 100 % (05/12 0650) FiO2 (%):  [40 %] 40 % (05/11 2027) Weight:  [73 kg-76.7 kg] 73 kg (05/12 0650) Last BM Date: 09/01/18  Intake/Output from previous day: 05/11 0701 - 05/12 0700 In: 120 [P.O.:120] Out: 3695 [Urine:3675; Drains:20] Intake/Output this shift: No intake/output data recorded.  PE: Gen: Alert, NAD, pleasant Pulm: slightly tachypneic, wheezing Skin: warm and dry Abd: Soft, NT/ND, +BS,open midline incision with some granulation tissue at base and on walls and fibrinous exudate cover as below, JP drain with serosanguinous output - removed     Lab Results:  Recent Labs    08/31/18 0524 09/01/18 0628  WBC 10.1 11.2*  HGB 7.5* 7.3*  HCT 22.0* 22.1*  PLT 135* 162   BMET Recent Labs    09/01/18 0628 09/02/18 0500  NA 143 140  K 4.3 4.0  CL 108 103  CO2 23 21*  GLUCOSE 117* 218*  BUN 95* 117*  CREATININE 2.52* 2.52*  CALCIUM 8.4* 8.7*   PT/INR No results for input(s): LABPROT, INR in the last 72 hours. CMP     Component Value Date/Time   NA 140 09/02/2018 0500   K 4.0 09/02/2018 0500   CL 103 09/02/2018 0500   CO2 21 (L) 09/02/2018 0500   GLUCOSE 218 (H) 09/02/2018 0500   BUN 117 (H) 09/02/2018 0500   CREATININE 2.52 (H) 09/02/2018 0500   CREATININE 1.07 (H) 02/13/2018 1142   CALCIUM 8.7 (L) 09/02/2018 0500   PROT 5.3 (L) 09/01/2018 0628   ALBUMIN 1.2 (L) 09/01/2018 0628   AST 47 (H) 09/01/2018 0628   ALT 24 09/01/2018 0628   ALKPHOS 312 (H)  09/01/2018 0628   BILITOT 3.1 (H) 09/01/2018 0628   GFRNONAA 18 (L) 09/02/2018 0500   GFRNONAA 49 (L) 02/13/2018 1142   GFRAA 20 (L) 09/02/2018 0500   GFRAA 57 (L) 02/13/2018 1142   Lipase     Component Value Date/Time   LIPASE 30 08/13/2018 1926       Studies/Results: Dg Chest Port 1 View  Result Date: 09/01/2018 CLINICAL DATA:  Sudden increase in shortness of breath. Placed on BiPAP. EXAM: PORTABLE CHEST 1 VIEW COMPARISON:  08/31/2018. FINDINGS: Cardiomegaly with multi lead pacer LEFT subclavian approach stable. Central venous catheter tip stable, RIGHT atrium. Moderate vascular congestion, stable. Bibasilar subsegmental atelectasis, stable. IMPRESSION: Stable chest. Electronically Signed   By: Staci Righter M.D.   On: 09/01/2018 16:34   Dg Chest Port 1 View  Result Date: 08/31/2018 CLINICAL DATA:  Hypoxia EXAM: PORTABLE CHEST 1 VIEW COMPARISON:  08/25/2018 FINDINGS: Unchanged AP portable examination with mild, diffuse interstitial pulmonary opacity, likely edema, and small, layering bilateral pleural effusions. Cardiomegaly with left chest multi lead pacer defibrillator. Right neck vascular catheter. IMPRESSION: Unchanged AP portable examination with mild, diffuse interstitial pulmonary opacity, likely edema, and small, layering bilateral pleural effusions. Cardiomegaly with left chest multi lead pacer defibrillator. Right neck vascular catheter. Electronically Signed   By: Eddie Candle M.D.   On: 08/31/2018 19:20  Anti-infectives: Anti-infectives (From admission, onward)   Start     Dose/Rate Route Frequency Ordered Stop   08/23/18 1400  piperacillin-tazobactam (ZOSYN) IVPB 2.25 g     2.25 g 100 mL/hr over 30 Minutes Intravenous Every 8 hours 08/23/18 0928 08/30/18 2230   08/21/18 1200  piperacillin-tazobactam (ZOSYN) IVPB 3.375 g  Status:  Discontinued     3.375 g 12.5 mL/hr over 240 Minutes Intravenous Every 8 hours 08/21/18 1102 08/23/18 0928   08/14/18 1000   hydroxychloroquine (PLAQUENIL) tablet 200 mg  Status:  Discontinued    Note to Pharmacy:  Take 1 tablet by mouth daily Monday through Friday only     200 mg Oral Once per day on Mon Tue Wed Thu Fri 08/14/18 0742 08/21/18 1147   08/13/18 2200  doxycycline (VIBRA-TABS) tablet 100 mg  Status:  Discontinued     100 mg Oral Every 12 hours 08/13/18 2134 08/13/18 2135   08/13/18 2200  doxycycline (VIBRAMYCIN) 100 mg in sodium chloride 0.9 % 250 mL IVPB  Status:  Discontinued     100 mg 125 mL/hr over 120 Minutes Intravenous Every 12 hours 08/13/18 2135 08/19/18 1702   08/13/18 2145  cefTRIAXone (ROCEPHIN) 1 g in sodium chloride 0.9 % 100 mL IVPB  Status:  Discontinued     1 g 200 mL/hr over 30 Minutes Intravenous Every 24 hours 08/13/18 2134 08/14/18 0743       Assessment/Plan POD12, s/p ex lapw/SBR, LOA, appendectomyfor SBO&ischemic/necroticbowel, 4/30 - PT -Extubated.Offpressors.Out of unit.AppreciateTRHhelp in her care -ContTPNtill inc intake. Pre-alb 8.6 yesterday, improving.Hopefully can be weaned soon.DYS1dietand as tolerated per speech - Bloody BM5/7.[place] held.no bloody BMs since then - continue TID WTD dressing changes  - JP removed -completed zosyn x10 days, patient afebrile - Path benign. 58cm SB w/ hemorrhage and prominent necrosis w/ viable margins.Appendix w/ fibrous obliteration and patchy acute serositis. - Mobilize and IS  Acute on chronic renal failure - Cr 2.52 stable,Defer fluid balance to TRH.Foley out  ICD/CAD/CHF -per cardiology, on amio  Acute Respiratory Failure- Off vent.Improving. Per TRH. Some wheezing this AM  ABL Anemia-given 1 unit PRBCs forHg 6.7(5/9), post-transfusion hgb 7.9. hgb 7.3 yesterday - no signs of external bleeding this morning, no blood in stool. VSS  FEN -TPN,DYS1 (AAT per speech) VTE - SCDs, Mobilize as tolerating with PT. ID -zosyn 4/30>>5/9.WBC 11.2, afebrile Foley - Removed 5/7 Follow  up - Dr. Marlou Starks  Plan: Abdominal exam benign. Continue Dysphagia 1 diet, ok to advance if cleared by speech therapy. No signs of bleeding today, continue to monitor H/H. Mobilize as able. Continue TID dressing changes.  LOS: 20 days    Brigid Re , [facility] 09/02/2018, 8:21 AM Pager: [phone] Consults: [phone]

## 2018-09-02 NOTE — Progress Notes (Signed)
Physical Therapy Treatment Patient Details Name: [name] MRN: [id number] DOB: 12/15/1940 Today's Date: 09/02/2018    History of Present Illness 78 year old female with significant cardiac history that is post exploratory laparotomy for small bowel obstruction with associated ischemic bowel on 4/30, return to the intensive care on mechanical ventilator, critical care asked to assist with care.  VDRF 4/30-5/3.  Also PNA.  FVC:BSWHQPRFFM heart failure with EF 30%, nonsustained VT, pericardial effusion CAD w/ prior AICD, diastolic heart failure, CKD stage III, hypertension, rheumatoid arthritis, anemia of chronic disease.    PT Comments    Pt was notably fatigued, but participated with a little encouragement.  Emphasis on warm up ROM, transition to EOB, transfer to [facility] and back to bed.   Follow Up Recommendations  SNF;Supervision/Assistance - 24 hour     Equipment Recommendations  Other (comment)(TBA)    Recommendations for Other Services       Precautions / Restrictions Precautions Precautions: Fall    Mobility  Bed Mobility Overal bed mobility: Needs Assistance Bed Mobility: Supine to Sit     Supine to sit: Mod assist Sit to supine: Mod assist   General bed mobility comments: assist to initiate activity and assist to lift LEs off of and onto  bed and to lift trunk    Transfers Overall transfer level: Needs assistance Equipment used: Rolling walker (2 wheeled) Transfers: Sit to/from [facility] Sit to Stand: Mod assist;+2 physical assistance;+2 safety/equipment Stand pivot transfers: Mod assist;+2 physical assistance;+2 safety/equipment       General transfer comment: assist to power up into standing and assist for balance   Ambulation/Gait                 Stairs             Wheelchair Mobility    Modified Rankin (Stroke Patients Only)       Balance Overall balance assessment: Needs assistance Sitting-balance support: Feet  supported;Bilateral upper extremity supported Sitting balance-Leahy Scale: Fair Sitting balance - Comments: able to sit EOB with min guard assist    Standing balance support: Bilateral upper extremity supported Standing balance-Leahy Scale: Poor Standing balance comment: Pt reliant on bil. UE support                             Cognition Arousal/Alertness: Awake/alert Behavior During Therapy: WFL for tasks assessed/performed Overall Cognitive Status: Difficult to assess                                 General Comments: Pt is extremely HOH, only able to hear minimal info if you talk loudly in her right ear.  She indicates she reads lips, but with masks in place this makes it very difficult for her.  She follow gestural commands well.       Exercises      General Comments General comments (skin integrity, edema, etc.): VSS with 02 sats 94% on RA       Pertinent Vitals/Pain Pain Assessment: No/denies pain    Home Living                      Prior Function            PT Goals (current goals can now be found in the care plan section) Acute Rehab PT Goals PT Goal Formulation: With patient Time For  Goal Achievement: 09/08/18 Potential to Achieve Goals: Good Progress towards PT goals: Progressing toward goals    Frequency    Min 2X/week      PT Plan Current plan remains appropriate    Co-evaluation PT/OT/SLP Co-Evaluation/Treatment: Yes Reason for Co-Treatment: Complexity of the patient's impairments (multi-system involvement) PT goals addressed during session: Mobility/safety with mobility OT goals addressed during session: ADL's and self-care      AM-PAC PT "6 Clicks" Mobility   Outcome Measure  Help needed turning from your back to your side while in a flat bed without using bedrails?: A Lot Help needed moving from lying on your back to sitting on the side of a flat bed without using bedrails?: A Lot Help needed moving to  and from a bed to a chair (including a wheelchair)?: A Lot Help needed standing up from a chair using your arms (e.g., wheelchair or bedside chair)?: A Lot Help needed to walk in hospital room?: A Lot Help needed climbing 3-5 steps with a railing? : Total 6 Click Score: 11    End of Session   Activity Tolerance: Patient limited by fatigue;Patient limited by pain Patient left: in chair;with call bell/phone within reach;with chair alarm set Nurse Communication: Mobility status PT Visit Diagnosis: Unsteadiness on feet (R26.81);Muscle weakness (generalized) (M62.81)     Time: 5701-7793 PT Time Calculation (min) (ACUTE ONLY): 24 min  Charges:  $Therapeutic Activity: 8-22 mins                     09/02/2018  Donnella Sham, PT Acute Rehabilitation Services [phone]  (pager) [phone]  (office)   [name] 09/02/2018, 5:00 PM

## 2018-09-02 NOTE — Consult Note (Signed)
   [facility] Inpatient Consult   09/02/2018  [name] Jul 28, 1940 [id number]  Follow up:  Reviewed [facility] care team notes for disposition and needs.  Patient is still being recommended for a skilled nursing facility transition when ready.  No current [facility] Care Management needs noted for post hospital follow up.  For questions, please contact:  [name], RN BSN [facility] Liaison  [phone] business mobile phone Toll free office [phone]  Fax number: [phone] [name]  [email or web address]

## 2018-09-02 NOTE — Progress Notes (Addendum)
Modified Barium Swallow Progress Note  Patient Details  Name: [name] MRN: [id number] Date of Birth: 09-Sep-1940  Today's Date: 09/02/2018  Modified Barium Swallow completed.  Full report located under Chart Review in the Imaging Section.  Brief recommendations include the following:  Clinical Impression  Pt presents with a primary oral dysphagia marked by difficulty manipulating mechanical soft solids, required eventual manual removal from oral cavity; purees required lingual pumping with eventual transition into pharynx.  Once material reached valleculae, the pharyngeal swallow was triggered and solids passed readily through UES into esophagus without difficulty.  There was no pharyngeal residue noted throughout study.  Thin liquids were consumed with occasional high, transient penetration (Penetration-Aspiration Scale score of #2, considered WNL).  There was no aspiration, even when taxing the mechanical system with large, successive and mixed boluses.  Recommend continuing a pureed/dysphagia 1 diet; give meds whole in puree; allow thin liquids; assist with meals/self-feeding as needed.    Swallow Evaluation Recommendations       SLP Diet Recommendations: Thin liquid;Dysphagia 1 (Puree) solids   Liquid Administration via: Cup;Straw   Medication Administration: Whole meds with puree   Supervision: Patient able to self feed;Staff to assist with self feeding           Oral Care Recommendations: Oral care BID      [name], [place] Office number [phone] Pager [phone]   [name] 09/02/2018,3:55 PM

## 2018-09-02 NOTE — Progress Notes (Signed)
PROGRESS NOTE    [name]  EYC:[id number] DOB: 1940-10-07 DOA: 08/13/2018 PCP: [name], MD  Brief Narrative: PCCM Pick up 5/5 Chronically ill 78 year old female with history of chronic systolic CHF, nonsustained V. tach, pericardial effusion, CAD, AICD, chronic diastolic CHF, chronic kidney disease stage III, hypertension, rheumatoid arthritis, anemia of chronic disease was admitted to [facility] on 4/22 with small bowel obstruction, also started on antibiotics for possible pneumonia -Failed conservative management developed high-grade small bowel obstruction by 4/28 -4/29 transferred to [facility] -4/30 went to the OR underwent ex lap with lysis of adhesion, bowel resection for ischemic bowel, returned to the ICU on ventilator, hypotensive -4/30 shocked for VT, started on pressors -5/1: Developed progressive renal failure oliguria -5/2: Off pressors -5/3: Extubated  Week of 08/27/2018-09/02/2018: Prolonged postop ileus, ongoing TNA, diet gradually advancing per ST, diarrhea improved, self-limiting GI bleed, developed acute respiratory distress 5/11 due to decompensated CHF and upper airway wheeze, CCM reconsulted, improving.  Assessment & Plan:   Acute respiratory failure with hypoxia -Noted on 5/11.  Suspected due to decompensated CHF and a component of bronchospasm. -PCCM consulted, treated with brief BiPAP for 3 to 4 hours, initiated IV Lasix and Solu-Medrol.  Low index of suspicion for airway edema. -Improved.  Continue IV Lasix.  Tapering steroids.  Continue bronchodilators.  BiPAP as needed. -PCCM signed off 5/12.   Septic shock -Resolved -Status post VDRF and pressor support, extubated 5/3, weaned off pressors 5/2. Stable  Small bowel obstruction, ischemic colon/postop ileus -Failed conservative management of bowel obstruction at [facility] -On 4/30 underwent ex lap for SBO and small bowel resection of ischemic necrotic bowel -Completed 10 days of IV  Zosyn -On TNA awaiting return of bowel function.  NG tube discontinued. -Wound VAC has been removed 5/6.  Wound is currently open and fascia intact.  Wet-to-dry dressing. -Tolerating dysphagia 1 diet recommended by speech therapy. -Had 2 bloody BMs on 5/7 but none since and felt to be bleeding from staple line.  Still has JP drain. -Noted diarrhea since 5/7, Flexi-Seal placed, probiotics added, low index of suspicion for C. difficile in the absence of abdominal pain or leukocytosis. -Diarrhea seems to have resolved.  Discontinued Flexi-Seal. -Hopefully can discontinue TPN soon based on adequate calorie intake orally.   Diarrhea -Low index of suspicion for C. difficile.  Added Florastor.  Monitor for now.  Likely related to resolving postop ileus.  -Resolved.  Discontinued Flexi-Seal 5/12.  Acute lower GI bleed -As per general surgery, felt to be due to staple line -DC subcutaneous heparin.  Has SCDs for DVT prophylaxis. -Resolved.  Continue to monitor closely.  Acute kidney injury on CKD stage II -Baseline creatinine around 1.2 -Creatinine worsened to 3.2, in the setting of septic shock, ATN, ischemic bowel -Creatinine improved.  Creatinine has slowly drifted down to 2.3.  Hypokalemia resolved -Continue TNA, renal dosing medications.   -Creatinine stable in the 2.5 range.  Possibly patient has achieved new creatinine baseline.  Hypophosphatemia/hypomagnesemia Replaced  NAG metabolic acidosis Likely due to diarrhea and mild hyperchloremia.  Resolved and sodium bicarbonate discontinued.  Acute on chronic systolic CHF, AICD -EF of 30% with by V ICD -Was off diuretics, ACE inhibitor in the setting of AKI and septic shock -Since 5/10, developed progressive dyspnea and wheezing. -Gained 22 pounds weight since 4/22 likely due to fluids, TNA, holding off of diuretics. -IV Lasix resumed 5/11.  Good urine output, continue and monitor BMP closely. -Urine output 3.7 L yesterday.  9 pound  weight loss since yesterday.   Acute on chronic anemia -Acute on chronic -Hemoglobin has further drifted down to 6.7 on 5/9 in the absence of overt bleeding.  Transfusing 1 unit of PRBC. -Anemia panel reviewed: Suggestive of anemia of chronic disease. -Hemoglobin improved posttransfusion to 7.9 > 7.5 >7.3 >7.3. -Follow daily CBCs and transfuse if hemoglobin 7 or less.  Paroxysmal atrial fibrillation -Status post DC cardioversion on 4/29, currently in sinus rhythm - No anticoagulation currently due to GI bleed issues. - Now on oral amiodarone, carvedilol and IV Lasix -Outpatient follow-up with Dr. [name]. -AV/V paced rhythm.  Occasional transient NSVT.  Severe protein calorie malnutrition -Tolerating dysphagia 1 diet.  Hopefully can discontinue TNA soon.  History of CAD -Carvedilol 3.125 mg twice daily started 5/7.  Thrombocytopenia Resolved today.  Hyperglycemia Now likely related to steroids, tapering.  Already on SSI for TPN.  Check A1c.  DVT prophylaxis: SCDs Code Status: Full code Family Communication: I called spouse on 5/11, updated care and answered questions. Disposition Plan: Patient not yet medically ready for discharge.  Will likely need SNF for rehab when stable for discharge.  As per clinical social worker, patient will need 2 negative COVID 19 testing prior to DC to SNF.  Ordered testing 5/11.  No clinical suspicion for COVID-19 at this time.  Consultants:   Cardiology  PCCM  Surgery    Procedures:   -DC cardioversion 4/29  -4/30 :Ex lap with lysis of adhesion, bowel resection for ischemic bowel,  BiPAP 5/11  Patient has right IJ, PPM, Foley catheter (discontinued 5/7) and abdominal wound VAC (discontinued 5/6).  Antimicrobials:    Subjective: Denies complaints.  Denies pain.  Voices that her breathing is "fair".  As per nursing, stool formed and not much through the Flexi-Seal.  Objective: Vitals:   09/02/18 0507 09/02/18 0650 09/02/18  0948 09/02/18 1200  BP:   (!) 132/51 (!) 120/46  Pulse: 70 70 74 69  Resp: 20 15  13   Temp: 97.7 F (36.5 C)     TempSrc: Oral     SpO2: 97% 100%  100%  Weight:  73 kg    Height:        Intake/Output Summary (Last 24 hours) at 09/02/2018 1714 Last data filed at 09/02/2018 1043 Gross per 24 hour  Intake 240 ml  Output 2920 ml  Net -2680 ml   Filed Weights   08/31/18 1841 09/01/18 0933 09/02/18 0650  Weight: 76.4 kg 76.7 kg 73 kg    Examination:  General exam: Early, frail chronically ill female, very hard of hearing lying propped up in bed.  She looks much improved compared to yesterday.  Minimal intermittent audible wheezing from a distance. Respiratory system: Improved breath sounds.  Mild intermittent upper airway wheezing.  Few basal crackles.  Otherwise clear to auscultation.  No increased work of breathing. Cardiovascular system: S1 & S2 heard, RRR.  No JVD or murmurs.  No pedal edema but has 1+ pitting bilateral thigh and sacral edema.  Telemetry personally reviewed: V paced rhythm.  No further NSVT noted.  Gastrointestinal system: Abdomen is soft, nondistended, nontender, surgical site dressing clean and dry.  Normal bowel sounds.  Surgical wound as per picture in CCS progress note from 5/12 >large midline incision, some granulation tissue admixed with fibrinous exudate.   Central nervous system: Alert and oriented x2.  No focal deficits. Extremities: right IJ PICC line.  Right forearm dressing clean and dry. Skin: No rashes, lesions or ulcers  Psychiatry:  Mood & affect appropriate.     Data Reviewed:   CBC: Recent Labs  Lab 08/29/18 0240 08/30/18 0321 08/30/18 2205 08/31/18 0524 09/01/18 0628 09/02/18 0814  WBC 11.2* 10.4  --  10.1 11.2* 9.0  NEUTROABS  --   --   --   --  8.6*  --   HGB 7.5* 6.7* 7.9* 7.5* 7.3* 7.3*  HCT 21.3* 19.1* 22.9* 22.0* 22.1* 22.4*  MCV 77.5* 77.3*  --  78.9* 81.9 82.7  PLT 108* 119*  --  135* 162 939   Basic Metabolic  Panel: Recent Labs  Lab 08/27/18 0513  08/28/18 0458 08/28/18 1500 08/29/18 0240 08/30/18 0321 08/31/18 0524 09/01/18 0628 09/02/18 0500  NA 143   < > 139 139 140 141 142 143 140  K 2.8*   < > 3.7 3.6 4.2 4.0 4.1 4.3 4.0  CL 112*   < > 115* 115* 115* 113* 110 108 103  CO2 17*   < > 15* 14* 15* 19* 20* 23 21*  GLUCOSE 135*   < > 128* 151* 123* 125* 121* 117* 218*  BUN 61*   < > 68* 68* 71* 74* 81* 95* 117*  CREATININE 2.56*   < > 2.50* 2.51* 2.53* 2.42* 2.34* 2.52* 2.52*  CALCIUM 7.8*   < > 7.8* 7.7* 8.0* 8.0* 8.1* 8.4* 8.7*  MG 1.7  --  1.6* 2.0  --   --  1.8 1.8  --   PHOS 2.2*  --  2.4* 2.5  --   --  3.3 3.8  --    < > = values in this interval not displayed.   GFR: Estimated Creatinine Clearance: 17.7 mL/min (A) (by C-G formula based on SCr of 2.52 mg/dL (H)). Liver Function Tests: Recent Labs  Lab 08/27/18 0513 08/28/18 0458 08/29/18 0240 09/01/18 0628  AST 54* 51* 40 47*  ALT 15 18 19 24   ALKPHOS 220* 223* 204* 312*  BILITOT 2.0* 2.2* 2.3* 3.1*  PROT 4.6* 4.5* 4.7* 5.3*  ALBUMIN 1.1* 1.1* 1.1* 1.2*   Cardiac Enzymes: No results for input(s): CKTOTAL, CKMB, CKMBINDEX, TROPONINI in the last 168 hours. CBG: Recent Labs  Lab 09/01/18 2050 09/02/18 0008 09/02/18 0346 09/02/18 0825 09/02/18 1150  GLUCAP 164* 204* 202* 198* 283*   Lipid Profile: Recent Labs    09/01/18 0627  TRIG 81   Urine analysis:    Component Value Date/Time   COLORURINE AMBER (A) 08/13/2018 1849   APPEARANCEUR CLOUDY (A) 08/13/2018 1849   LABSPEC 1.016 08/13/2018 1849   PHURINE 5.0 08/13/2018 1849   GLUCOSEU NEGATIVE 08/13/2018 1849   HGBUR MODERATE (A) 08/13/2018 1849   BILIRUBINUR NEGATIVE 08/13/2018 1849   KETONESUR NEGATIVE 08/13/2018 1849   PROTEINUR 30 (A) 08/13/2018 1849   UROBILINOGEN 0.2 07/17/2013 2240   NITRITE NEGATIVE 08/13/2018 1849   LEUKOCYTESUR TRACE (A) 08/13/2018 1849    Recent Results (from the past 240 hour(s))  Novel Coronavirus, NAA (hospital order;  send-out to ref lab)     Status: None   Collection Time: 09/01/18 10:10 AM  Result Value Ref Range Status   SARS-CoV-2, NAA NOT DETECTED NOT DETECTED Final    Comment: (NOTE) This test was developed and its performance characteristics determined by [employer]. This test has not been FDA cleared or approved. This test has been authorized by FDA under an Emergency Use Authorization (EUA). This test is only authorized for the duration of time the declaration that circumstances exist justifying the authorization of  the emergency use of in vitro diagnostic tests for detection of SARS-CoV-2 virus and/or diagnosis of COVID-19 infection under section 564(b)(1) of the Act, 21 U.S.C. 737TGG-2(I)(9), unless the authorization is terminated or revoked sooner. When diagnostic testing is negative, the possibility of a false negative result should be considered in the context of a patient's recent exposures and the presence of clinical signs and symptoms consistent with COVID-19. An individual without symptoms of COVID-19 and who is not shedding SARS-CoV-2 virus would expect to have a negative (not detected) result in this assay. Performed  At: [facility] [address] [place], [place] [id number] Rush Farmer MD JK:[id number]    [place]  Final    Comment: Performed at [facility], [place] [address] [place], [place] [address]         Radiology Studies: Dg Chest Port 1 View  Result Date: 09/01/2018 CLINICAL DATA:  Sudden increase in shortness of breath. Placed on BiPAP. EXAM: PORTABLE CHEST 1 VIEW COMPARISON:  08/31/2018. FINDINGS: Cardiomegaly with multi lead pacer LEFT subclavian approach stable. Central venous catheter tip stable, RIGHT atrium. Moderate vascular congestion, stable. Bibasilar subsegmental atelectasis, stable. IMPRESSION: Stable chest. Electronically Signed   By: Staci Righter M.D.   On: 09/01/2018 16:34   Dg Chest Port 1  View  Result Date: 08/31/2018 CLINICAL DATA:  Hypoxia EXAM: PORTABLE CHEST 1 VIEW COMPARISON:  08/25/2018 FINDINGS: Unchanged AP portable examination with mild, diffuse interstitial pulmonary opacity, likely edema, and small, layering bilateral pleural effusions. Cardiomegaly with left chest multi lead pacer defibrillator. Right neck vascular catheter. IMPRESSION: Unchanged AP portable examination with mild, diffuse interstitial pulmonary opacity, likely edema, and small, layering bilateral pleural effusions. Cardiomegaly with left chest multi lead pacer defibrillator. Right neck vascular catheter. Electronically Signed   By: Eddie Candle M.D.   On: 08/31/2018 19:20   Dg Swallowing Func-speech Pathology  Result Date: 09/02/2018 Objective Swallowing Evaluation: Type of Study: MBS-Modified Barium Swallow Study  Patient Details Name: [name] MRN: [id number] Date of Birth: November 21, 1940 Today's Date: 09/02/2018 Time: SLP Start Time (ACUTE ONLY): 1315 -SLP Stop Time (ACUTE ONLY): 1340 SLP Time Calculation (min) (ACUTE ONLY): 25 min Past Medical History: Past Medical History: Diagnosis Date  Anemia   Hgb of 9-10  Anemia of chronic disease   Hgb of 9-10 chronically; 06/2010: H&H-10.7/33.5, MCV-81, normal iron studies in 2010   Arteriosclerotic cardiovascular disease (ASCVD)   Remote PTCA by patient report; LBBB; associated cardiomyopathy, presumed ischemic with EF 40-45% previously, 20% in 06/2009; h/o clinical congestive heart failure; negative stress nuclear in 2009 with inferoseptal and apical scar  Automatic implantable cardioverter-defibrillator in situ   Breast cancer (HCC)   breast  Chronic bronchitis (HCC)   Chronic renal disease, stage 3, moderately decreased glomerular filtration rate (GFR) between 30-59 mL/min/1.73 square meter ([place]) 02/01/2016  Congestive heart failure (CHF) (HCC)   Elevated cholesterol   Elevated sed rate   GERD (gastroesophageal reflux disease)   GI bleed   Gout   HOH (hard  of hearing)   Hyperlipidemia   Hypertension   Rheumatoid arthritis ([place])   UTI (urinary tract infection)  Past Surgical History: Past Surgical History: Procedure Laterality Date  BI-VENTRICULAR IMPLANTABLE CARDIOVERTER DEFIBRILLATOR N/A 07/28/2012  Procedure: BI-VENTRICULAR IMPLANTABLE CARDIOVERTER DEFIBRILLATOR  (CRT-D);  Surgeon: Evans Lance, MD;  Location: [facility] CATH LAB;  Service: Cardiovascular;  Laterality: N/A;  BI-VENTRICULAR IMPLANTABLE CARDIOVERTER DEFIBRILLATOR  (CRT-D)  07/28/2012  BOWEL RESECTION N/A 08/21/2018  Procedure: Small Bowel Resection;  Surgeon:  Jovita Kussmaul, MD;  Location: [place];  Service: General;  Laterality: N/A;  BREAST BIOPSY Bilateral   CATARACT EXTRACTION W/ INTRAOCULAR LENS IMPLANT Left   CATARACT EXTRACTION W/PHACO Right 09/15/2013  Procedure: CATARACT EXTRACTION PHACO AND INTRAOCULAR LENS PLACEMENT ([place]);  Surgeon: Elta Guadeloupe T. Gershon Crane, MD;  Location: [facility] OR[facility];  Service: Ophthalmology;  Laterality: Right;  CDE:  10.74  COLONOSCOPY N/A 03/04/2015  Procedure: COLONOSCOPY;  Surgeon: Rogene Houston, MD;  Location[facility] ENDO SU[facility];  Service: Endoscopy;  Laterality: N/A;  10:50   ESOPHAGOGASTRODUODENOSCOPY N/A 03/04/2015  Procedure: ESOPHAGOGASTRODUODENOSCOPY (EGD);  Surgeon: Rogene Houston, MD;  [facility];  Service: Endoscopy;  Laterality: N/A;  ESOPHAGOGASTRODUODENOSCOPY N/A 09/21/2016  Procedure: ESOPHAGOGASTRODUODENOSCOPY (EGD);  Surgeon: Rogene Houston, MD;  [facility];  Service: Endoscopy;  Laterality: N/A;  GIVENS CAPSULE STUDY N/A 09/22/2016  Procedure: GIVENS CAPSULE STUDY;  Surgeon: Rogene Houston, MD;  [facility];  Service: Endoscopy;  Laterality: N/A;  LAPAROTOMY N/A 08/21/2018  Procedure: EXPLORATORY LAPAROTOMY WITH LYSIS OF ADHESIONS;  Surgeon: Jovita Kussmaul, MD;  Location: [place];  Service: General;  Laterality: N/A;  MASTECTOMY Left 1998  TENDON TRANSFER Right 08/15/2017  Procedure: TENDON TRANSFER RIGHT WRIST EXTENSORS AS  NEEDED, ULNA RESECTION AND STABILIZATION;  Surgeon: Charlotte Crumb, MD;  Location: [place];  Service: Orthopedics;  Laterality: Right;  TOTAL KNEE ARTHROPLASTY Right   Dr.Harrison  TUBAL LIGATION   HPI: Patient is a 78 y.o. female with PMH: chronic CHF, CAD with remote MI, HTN, hyperlipidemia, CKD stage III, chronic anemia, GERD, who underwent ex-lap for SBO on 4/30. She was transferred to ICU post-op for severe hypotension and shock, was intubated from 4/30 to 5/3.  Subjective: pleasant, cooperative, hard of hearing Assessment / Plan / Recommendation CHL IP CLINICAL IMPRESSIONS 09/02/2018 Clinical Impression Pt presents with a primary oral dysphagia marked by difficulty manipulating mechanical soft solids, requiring eventual manual removal from oral cavity; purees required lingual pumping with eventual transition into pharynx.  Once material reached valleculae, the pharyngeal swallow was triggered and solids passed readily through UES into esophagus without difficulty.  There was no pharyngeal residue noted throughout study.  Thin liquids were consumed with occasional high, transient penetration (Penetration-Aspiration Scale score of #2, considered WNL).  There was no aspiration, even when taxing the mechanical system with large, successive and mixed boluses.  Recommend continuing a pureed/dysphagia 1 diet; give meds whole in puree; allow thin liquids; assist with meals/self-feeding as needed.  SLP Visit Diagnosis Dysphagia, oral phase (R13.11) Attention and concentration deficit following -- Frontal lobe and executive function deficit following -- Impact on safety and function Mild aspiration risk   CHL IP TREATMENT RECOMMENDATION 09/02/2018 Treatment Recommendations Therapy as outlined in treatment plan below   Prognosis 08/26/2018 Prognosis for Safe Diet Advancement Good Barriers to Reach Goals -- Barriers/Prognosis Comment -- CHL IP DIET RECOMMENDATION 09/02/2018 SLP Diet Recommendations Thin liquid;Dysphagia 1  (Puree) solids Liquid Administration via Cup;Straw Medication Administration Whole meds with puree Compensations -- Postural Changes --   CHL IP OTHER RECOMMENDATIONS 09/02/2018 Recommended Consults -- Oral Care Recommendations Oral care BID Other Recommendations --   CHL IP FOLLOW UP RECOMMENDATIONS 09/02/2018 Follow up Recommendations Skilled Nursing facility   [facility] IP FREQUENCY AND DURATION 09/02/2018 Speech Therapy Frequency (ACUTE ONLY) min 1 x/week Treatment Duration --      CHL IP ORAL PHASE 09/02/2018 Oral Phase Impaired Oral - Pudding Teaspoon -- Oral - Pudding Cup -- Oral - Honey Teaspoon --  Oral - Honey Cup -- Oral - Nectar Teaspoon -- Oral - Nectar Cup -- Oral - Nectar Straw -- Oral - Thin Teaspoon -- Oral - Thin Cup -- Oral - Thin Straw WFL Oral - Puree Weak lingual manipulation;Lingual pumping;Reduced posterior propulsion;Delayed oral transit Oral - Mech Soft Weak lingual manipulation;Lingual pumping;Reduced posterior propulsion;Delayed oral transit Oral - Regular -- Oral - Multi-Consistency -- Oral - Pill -- Oral Phase - Comment --  CHL IP PHARYNGEAL PHASE 09/02/2018 Pharyngeal Phase WFL Pharyngeal- Pudding Teaspoon -- Pharyngeal -- Pharyngeal- Pudding Cup -- Pharyngeal -- Pharyngeal- Honey Teaspoon -- Pharyngeal -- Pharyngeal- Honey Cup -- Pharyngeal -- Pharyngeal- Nectar Teaspoon -- Pharyngeal -- Pharyngeal- Nectar Cup -- Pharyngeal -- Pharyngeal- Nectar Straw -- Pharyngeal -- Pharyngeal- Thin Teaspoon -- Pharyngeal -- Pharyngeal- Thin Cup -- Pharyngeal -- Pharyngeal- Thin Straw -- Pharyngeal -- Pharyngeal- Puree -- Pharyngeal -- Pharyngeal- Mechanical Soft -- Pharyngeal -- Pharyngeal- Regular -- Pharyngeal -- Pharyngeal- Multi-consistency -- Pharyngeal -- Pharyngeal- Pill -- Pharyngeal -- Pharyngeal Comment --  No flowsheet data found. [name] 09/02/2018, 3:56 PM                   Scheduled Meds:  amiodarone  200 mg Oral Daily   carvedilol  3.125 mg Oral BID WC   chlorhexidine   15 mL Mouth Rinse BID   furosemide  40 mg Intravenous Q12H   insulin aspart  0-15 Units Subcutaneous Q6H   ipratropium-albuterol  3 mL Nebulization Q6H   mouth rinse  15 mL Mouth Rinse q12n4p   methylPREDNISolone (SOLU-MEDROL) injection  40 mg Intravenous Q12H   mupirocin ointment   Nasal BID   pantoprazole (PROTONIX) IV  40 mg Intravenous QHS   saccharomyces boulardii  250 mg Oral BID   Continuous Infusions:  TPN ADULT (ION) 60 mL/hr at 09/02/18 1708     LOS: 20 days    Vernell Leep, MD, FACP, [facility]. [facility]  To contact the attending provider between 7A-7P or the covering provider during after hours 7P-7A, please log into the web site [email or web address] and access using universal [place] password for that web site. If you do not have the password, please call the hospital operator.

## 2018-09-02 NOTE — Progress Notes (Signed)
Occupational Therapy Treatment Patient Details Name: [name] MRN: [id number] DOB: 05-Mar-1941 Today's Date: 09/02/2018    History of present illness 78 year old female with significant cardiac history that is post exploratory laparotomy for small bowel obstruction with associated ischemic bowel on 4/30, return to the intensive care on mechanical ventilator, critical care asked to assist with care.  VDRF 4/30-5/3.  Also PNA.  GTX:MIWOEHOZYY heart failure with EF 30%, nonsustained VT, pericardial effusion CAD w/ prior AICD, diastolic heart failure, CKD stage III, hypertension, rheumatoid arthritis, anemia of chronic disease.   OT comments  Pt seen in conjunction with PT.  She required mod A +2 to transfer to [facility] and total A for peri care.  She is able to stand with mod A for up to 5 mins.   She is extremely HOH and is able to hear very little.  Recommend SNF.   Follow Up Recommendations  SNF;Supervision/Assistance - 24 hour    Equipment Recommendations  Other (comment)    Recommendations for Other Services      Precautions / Restrictions Precautions Precautions: Fall       Mobility Bed Mobility Overal bed mobility: Needs Assistance Bed Mobility: Supine to Sit     Supine to sit: Mod assist Sit to supine: Mod assist   General bed mobility comments: assist to initiate activity and assist to lift LEs off of and onto  bed and to lift trunk    Transfers Overall transfer level: Needs assistance Equipment used: Rolling walker (2 wheeled) Transfers: Sit to/from [facility] Sit to Stand: Mod assist;+2 physical assistance;+2 safety/equipment Stand pivot transfers: Mod assist;+2 physical assistance;+2 safety/equipment       General transfer comment: assist to power up into standing and assist for balance     Balance Overall balance assessment: Needs assistance Sitting-balance support: Feet supported;Bilateral upper extremity supported Sitting balance-Leahy  Scale: Fair Sitting balance - Comments: able to sit EOB with min guard assist    Standing balance support: Bilateral upper extremity supported Standing balance-Leahy Scale: Poor Standing balance comment: Pt reliant on bil. UE support                            ADL either performed or assessed with clinical judgement   ADL                           Toilet Transfer: Moderate assistance;+2 for physical assistance;+2 for safety/equipment;BSC;RW   Toileting- Clothing Manipulation and Hygiene: Total assistance;Sit to/from stand Toileting - Clothing Manipulation Details (indicate cue type and reason): Pt incontinent of urine.  Assisted with peri care      Functional mobility during ADLs: Moderate assistance;+2 for physical assistance;+2 for safety/equipment;Rolling walker       Vision       Perception     Praxis      Cognition Arousal/Alertness: Awake/alert Behavior During Therapy: WFL for tasks assessed/performed Overall Cognitive Status: Difficult to assess                                 General Comments: Pt is extremely HOH, only able to hear minimal info if you talk loudly in her right ear.  She indicates she reads lips, but with masks in place this makes it very difficult for her.  She follow gestural commands well.  Exercises     Shoulder Instructions       General Comments VSS with 02 sats 94% on RA     Pertinent Vitals/ Pain       Pain Assessment: No/denies pain  Home Living                                          Prior Functioning/Environment              Frequency  Min 2X/week        Progress Toward Goals  OT Goals(current goals can now be found in the care plan section)  Progress towards OT goals: Progressing toward goals     Plan Discharge plan remains appropriate    Co-evaluation    PT/OT/SLP Co-Evaluation/Treatment: Yes Reason for Co-Treatment: Complexity of the  patient's impairments (multi-system involvement);For patient/therapist safety;To address functional/ADL transfers   OT goals addressed during session: ADL's and self-care      AM-PAC OT "6 Clicks" Daily Activity     Outcome Measure   Help from another person eating meals?: A Little Help from another person taking care of personal grooming?: A Lot Help from another person toileting, which includes using toliet, bedpan, or urinal?: Total Help from another person bathing (including washing, rinsing, drying)?: A Lot Help from another person to put on and taking off regular upper body clothing?: A Lot Help from another person to put on and taking off regular lower body clothing?: Total 6 Click Score: 11    End of Session Equipment Utilized During Treatment: Gait belt;Rolling walker  OT Visit Diagnosis: Unsteadiness on feet (R26.81);Other abnormalities of gait and mobility (R26.89);Muscle weakness (generalized) (M62.81);Other symptoms and signs involving cognitive function   Activity Tolerance Patient tolerated treatment well   Patient Left in bed;with call bell/phone within reach;with nursing/sitter in room   Nurse Communication Mobility status        Time: 2751-7001 OT Time Calculation (min): 28 min  Charges: OT General Charges $OT Visit: 1 Visit OT Treatments $Self Care/Home Management : 8-22 mins  Lucille Passy, OTR/L [place] Pager [phone] Office [phone]    Lucille Passy M 09/02/2018, 4:16 PM

## 2018-09-03 DIAGNOSIS — E43 Unspecified severe protein-calorie malnutrition: Secondary | ICD-10-CM

## 2018-09-03 LAB — CBC
HCT: 21.7 % — ABNORMAL LOW (ref 36.0–46.0)
Hemoglobin: 7 g/dL — ABNORMAL LOW (ref 12.0–15.0)
MCH: 27.3 pg (ref 26.0–34.0)
MCHC: 32.3 g/dL (ref 30.0–36.0)
MCV: 84.8 fL (ref 80.0–100.0)
Platelets: 211 10*3/uL (ref 150–400)
RBC: 2.56 MIL/uL — ABNORMAL LOW (ref 3.87–5.11)
RDW: 22.2 % — ABNORMAL HIGH (ref 11.5–15.5)
WBC: 10.1 10*3/uL (ref 4.0–10.5)
nRBC: 0.2 % (ref 0.0–0.2)

## 2018-09-03 LAB — COMPREHENSIVE METABOLIC PANEL
ALT: 34 U/L (ref 0–44)
AST: 52 U/L — ABNORMAL HIGH (ref 15–41)
Albumin: 1.4 g/dL — ABNORMAL LOW (ref 3.5–5.0)
Alkaline Phosphatase: 323 U/L — ABNORMAL HIGH (ref 38–126)
Anion gap: 12 (ref 5–15)
BUN: 138 mg/dL — ABNORMAL HIGH (ref 8–23)
CO2: 25 mmol/L (ref 22–32)
Calcium: 8.7 mg/dL — ABNORMAL LOW (ref 8.9–10.3)
Chloride: 105 mmol/L (ref 98–111)
Creatinine, Ser: 2.53 mg/dL — ABNORMAL HIGH (ref 0.44–1.00)
GFR calc Af Amer: 20 mL/min — ABNORMAL LOW (ref >60)
GFR calc non Af Amer: 17 mL/min — ABNORMAL LOW (ref >60)
Glucose, Bld: 213 mg/dL — ABNORMAL HIGH (ref 70–99)
Potassium: 3.7 mmol/L (ref 3.5–5.1)
Sodium: 142 mmol/L (ref 135–145)
Total Bilirubin: 2.7 mg/dL — ABNORMAL HIGH (ref 0.3–1.2)
Total Protein: 5.5 g/dL — ABNORMAL LOW (ref 6.5–8.1)

## 2018-09-03 LAB — GLUCOSE, CAPILLARY
Glucose-Capillary: 203 mg/dL — ABNORMAL HIGH (ref 70–99)
Glucose-Capillary: 175 mg/dL — ABNORMAL HIGH (ref 70–99)
Glucose-Capillary: 225 mg/dL — ABNORMAL HIGH (ref 70–99)
Glucose-Capillary: 257 mg/dL — ABNORMAL HIGH (ref 70–99)
Glucose-Capillary: 229 mg/dL — ABNORMAL HIGH (ref 70–99)

## 2018-09-03 LAB — HEMOGLOBIN A1C
Hgb A1c MFr Bld: 5.8 % — ABNORMAL HIGH (ref 4.8–5.6)
Mean Plasma Glucose: 119.76 mg/dL

## 2018-09-03 LAB — PHOSPHORUS: Phosphorus: 4.8 mg/dL — ABNORMAL HIGH (ref 2.5–4.6)

## 2018-09-03 LAB — HEMOGLOBIN AND HEMATOCRIT, BLOOD
HCT: 21.8 % — ABNORMAL LOW (ref 36.0–46.0)
Hemoglobin: 7 g/dL — ABNORMAL LOW (ref 12.0–15.0)

## 2018-09-03 MED ORDER — SODIUM CHLORIDE 0.9% FLUSH
10.0000 mL | INTRAVENOUS | Status: DC | PRN
  Administered 2018-09-04 – 2018-09-11 (×3): 10 mL
  Filled 2018-09-03 (×3): qty 40

## 2018-09-03 MED ORDER — INSULIN ASPART 100 UNIT/ML ~~LOC~~ SOLN
0.0000 [IU] | Freq: Four times a day (QID) | SUBCUTANEOUS | Status: DC
  Administered 2018-09-03: 7 [IU] via SUBCUTANEOUS
  Administered 2018-09-03 – 2018-09-04 (×3): 11 [IU] via SUBCUTANEOUS
  Administered 2018-09-04: 4 [IU] via SUBCUTANEOUS
  Administered 2018-09-04 – 2018-09-05 (×4): 7 [IU] via SUBCUTANEOUS
  Administered 2018-09-05: 11 [IU] via SUBCUTANEOUS
  Administered 2018-09-05 – 2018-09-06 (×2): 15 [IU] via SUBCUTANEOUS
  Administered 2018-09-06: 4 [IU] via SUBCUTANEOUS
  Administered 2018-09-06: 11 [IU] via SUBCUTANEOUS
  Administered 2018-09-07 (×2): 4 [IU] via SUBCUTANEOUS
  Administered 2018-09-07: 11 [IU] via SUBCUTANEOUS
  Administered 2018-09-07: 15 [IU] via SUBCUTANEOUS
  Administered 2018-09-08: 20 [IU] via SUBCUTANEOUS
  Administered 2018-09-08 – 2018-09-09 (×4): 4 [IU] via SUBCUTANEOUS
  Administered 2018-09-09: 7 [IU] via SUBCUTANEOUS
  Administered 2018-09-09: 11 [IU] via SUBCUTANEOUS
  Administered 2018-09-10: 7 [IU] via SUBCUTANEOUS
  Administered 2018-09-10 – 2018-09-11 (×2): 11 [IU] via SUBCUTANEOUS
  Administered 2018-09-11: 4 [IU] via SUBCUTANEOUS
  Administered 2018-09-11: 3 [IU] via SUBCUTANEOUS
  Administered 2018-09-11: 4 [IU] via SUBCUTANEOUS

## 2018-09-03 MED ORDER — TRACE MINERALS CR-CU-MN-SE-ZN 10-1000-500-60 MCG/ML IV SOLN
INTRAVENOUS | Status: AC
  Administered 2018-09-03: 18:00:00 via INTRAVENOUS
  Filled 2018-09-03: qty 710.4

## 2018-09-03 NOTE — Progress Notes (Signed)
13 Days Post-Op  Subjective: CC: No complaints Denies abdominal pain, N/V. Tolerating DYS 1 diet. Still on TPN. BM yesterday reported as non-bloody. Flexi-seal removed 5/12.   Objective: Vital signs in last 24 hours: Temp:  [97.7 F (36.5 C)-98.3 F (36.8 C)] 98 F (36.7 C) (05/13 0821) Pulse Rate:  [69-75] 74 (05/13 0821) Resp:  [13-19] 16 (05/13 0821) BP: (110-132)/(45-53) 130/51 (05/13 0821) SpO2:  [97 %-100 %] 100 % (05/13 0821) Last BM Date: 09/02/18  Intake/Output from previous day: 05/12 0701 - 05/13 0700 In: 340 [P.O.:340] Out: 900 [Urine:900] Intake/Output this shift: Total I/O In: -  Out: 700 [Drains:700]  PE: Gen: Alert, NAD, pleasant Heart: 70's while I was in the room on monitor  Pulm: slightly tachypneic, CTA b/l Abd: Soft, NT/ND, +BS,open midline incision with some granulation tissue at base and on walls and fibrinous exudate coveras below. There is sepration without evisceration. Prior drain site with tegederm in place. See picture below.  Msk: No pedal edema. 1+ edema of b/l thighs and appears equal. There is noted 1+ edema of the left forearm and hand. Cap refill intact, reacts to light touch, and radial pulse 2+ bilaterally.  Skin: warm and dry     Lab Results:  Recent Labs    09/02/18 0814 09/03/18 0440 09/03/18 0701  WBC 9.0 10.1  --   HGB 7.3* 7.0* 7.0*  HCT 22.4* 21.7* 21.8*  PLT 193 211  --    BMET Recent Labs    09/02/18 0500 09/03/18 0440  NA 140 142  K 4.0 3.7  CL 103 105  CO2 21* 25  GLUCOSE 218* 213*  BUN 117* 138*  CREATININE 2.52* 2.53*  CALCIUM 8.7* 8.7*   PT/INR No results for input(s): LABPROT, INR in the last 72 hours. CMP     Component Value Date/Time   NA 142 09/03/2018 0440   K 3.7 09/03/2018 0440   CL 105 09/03/2018 0440   CO2 25 09/03/2018 0440   GLUCOSE 213 (H) 09/03/2018 0440   BUN 138 (H) 09/03/2018 0440   CREATININE 2.53 (H) 09/03/2018 0440   CREATININE 1.07 (H) 02/13/2018 1142   CALCIUM  8.7 (L) 09/03/2018 0440   PROT 5.5 (L) 09/03/2018 0440   ALBUMIN 1.4 (L) 09/03/2018 0440   AST 52 (H) 09/03/2018 0440   ALT 34 09/03/2018 0440   ALKPHOS 323 (H) 09/03/2018 0440   BILITOT 2.7 (H) 09/03/2018 0440   GFRNONAA 17 (L) 09/03/2018 0440   GFRNONAA 49 (L) 02/13/2018 1142   GFRAA 20 (L) 09/03/2018 0440   GFRAA 57 (L) 02/13/2018 1142   Lipase     Component Value Date/Time   LIPASE 30 08/13/2018 1926       Studies/Results: Dg Chest Port 1 View  Result Date: 09/01/2018 CLINICAL DATA:  Sudden increase in shortness of breath. Placed on BiPAP. EXAM: PORTABLE CHEST 1 VIEW COMPARISON:  08/31/2018. FINDINGS: Cardiomegaly with multi lead pacer LEFT subclavian approach stable. Central venous catheter tip stable, RIGHT atrium. Moderate vascular congestion, stable. Bibasilar subsegmental atelectasis, stable. IMPRESSION: Stable chest. Electronically Signed   By: Staci Righter M.D.   On: 09/01/2018 16:34   Dg Swallowing Func-speech Pathology  Result Date: 09/02/2018 Objective Swallowing Evaluation: Type of Study: MBS-Modified Barium Swallow Study  Patient Details Name: CEANNA WAREING MRN: [id number] Date of Birth: 1940-05-03 Today's Date: 09/02/2018 Time: SLP Start Time (ACUTE ONLY): 4196 -SLP Stop Time (ACUTE ONLY): 1340 SLP Time Calculation (min) (ACUTE ONLY): 25 min Past Medical History:  Past Medical History: Diagnosis Date . Anemia   Hgb of 9-10 . Anemia of chronic disease   Hgb of 9-10 chronically; 06/2010: H&H-10.7/33.5, MCV-81, normal iron studies in 2010  . Arteriosclerotic cardiovascular disease (ASCVD)   Remote PTCA by patient report; LBBB; associated cardiomyopathy, presumed ischemic with EF 40-45% previously, 20% in 06/2009; h/o clinical congestive heart failure; negative stress nuclear in 2009 with inferoseptal and apical scar . Automatic implantable cardioverter-defibrillator in situ  . Breast cancer ([place])   breast . Chronic bronchitis ([place])  . Chronic renal disease, stage 3, moderately  decreased glomerular filtration rate (GFR) between 30-59 mL/min/1.73 square meter (HCC) 02/01/2016 . Congestive heart failure (CHF) ([place])  . Elevated cholesterol  . Elevated sed rate  . GERD (gastroesophageal reflux disease)  . GI bleed  . Gout  . HOH (hard of hearing)  . Hyperlipidemia  . Hypertension  . Rheumatoid arthritis ([place])  . UTI (urinary tract infection)  Past Surgical History: Past Surgical History: Procedure Laterality Date . BI-VENTRICULAR IMPLANTABLE CARDIOVERTER DEFIBRILLATOR N/A 07/28/2012  Procedure: BI-VENTRICULAR IMPLANTABLE CARDIOVERTER DEFIBRILLATOR  (CRT-D);  Surgeon: Evans Lance, MD;  [facility] CATH LAB;  Service: Cardiovascular;  Laterality: N/A; . BI-VENTRICULAR IMPLANTABLE CARDIOVERTER DEFIBRILLATOR  (CRT-D)  07/28/2012 . BOWEL RESECTION N/A 08/21/2018  Procedure: Small Bowel Resection;  Surgeon: Jovita Kussmaul, MD;  Location: [place];  Service: General;  Laterality: N/A; . BREAST BIOPSY Bilateral  . CATARACT EXTRACTION W/ INTRAOCULAR LENS IMPLANT Left  . CATARACT EXTRACTION W/PHACO Right 09/15/2013  Procedure: CATARACT EXTRACTION PHACO AND INTRAOCULAR LENS PLACEMENT (IOC);  Surgeon: Elta Guadeloupe T. Gershon Crane, MD;  Location: [facility];  Service: Ophthalmology;  Laterality: Right;  CDE:  10.74 . COLONOSCOPY N/A 03/04/2015  Procedure: COLONOSCOPY;  Surgeon: Rogene Houston, MD;  Location[facility] ENDO SU[facility];  Service: Endoscopy;  Laterality: N/A;  10:50  . ESOPHAGOGASTRODUODENOSCOPY N/A 03/04/2015  Procedure: ESOPHAGOGASTRODUODENOSCOPY (EGD);  Surgeon: Rogene Houston, MD;  [facility];  Service: Endoscopy;  Laterality: N/A; . ESOPHAGOGASTRODUODENOSCOPY N/A 09/21/2016  Procedure: ESOPHAGOGASTRODUODENOSCOPY (EGD);  Surgeon: Rogene Houston, MD;  [facility];  Service: Endoscopy;  Laterality: N/A; . GIVENS CAPSULE STUDY N/A 09/22/2016  Procedure: GIVENS CAPSULE STUDY;  Surgeon: Rogene Houston, MD;  [facility];  Service: Endoscopy;  Laterality: N/A; . LAPAROTOMY N/A 08/21/2018   Procedure: EXPLORATORY LAPAROTOMY WITH LYSIS OF ADHESIONS;  Surgeon: Jovita Kussmaul, MD;  [facility];  Service: General;  Laterality: N/A; . MASTECTOMY Left 1998 . TENDON TRANSFER Right 08/15/2017  Procedure: TENDON TRANSFER RIGHT WRIST EXTENSORS AS NEEDED, ULNA RESECTION AND STABILIZATION;  Surgeon: Charlotte Crumb, MD;  Location: [place];  Service: Orthopedics;  Laterality: Right; . TOTAL KNEE ARTHROPLASTY Right   Dr.Harrison . TUBAL LIGATION   HPI: Patient is a 78 y.o. female with PMH: chronic CHF, CAD with remote MI, HTN, hyperlipidemia, CKD stage III, chronic anemia, GERD, who underwent ex-lap for SBO on 4/30. She was transferred to ICU post-op for severe hypotension and shock, was intubated from 4/30 to 5/3.  Subjective: pleasant, cooperative, hard of hearing Assessment / Plan / Recommendation CHL IP CLINICAL IMPRESSIONS 09/02/2018 Clinical Impression Pt presents with a primary oral dysphagia marked by difficulty manipulating mechanical soft solids, requiring eventual manual removal from oral cavity; purees required lingual pumping with eventual transition into pharynx.  Once material reached valleculae, the pharyngeal swallow was triggered and solids passed readily through UES into esophagus without difficulty.  There was no pharyngeal residue noted throughout study.  Thin liquids  were consumed with occasional high, transient penetration (Penetration-Aspiration Scale score of #2, considered WNL).  There was no aspiration, even when taxing the mechanical system with large, successive and mixed boluses.  Recommend continuing a pureed/dysphagia 1 diet; give meds whole in puree; allow thin liquids; assist with meals/self-feeding as needed.  SLP Visit Diagnosis Dysphagia, oral phase (R13.11) Attention and concentration deficit following -- Frontal lobe and executive function deficit following -- Impact on safety and function Mild aspiration risk   CHL IP TREATMENT RECOMMENDATION 09/02/2018 Treatment  Recommendations Therapy as outlined in treatment plan below   Prognosis 08/26/2018 Prognosis for Safe Diet Advancement Good Barriers to Reach Goals -- Barriers/Prognosis Comment -- CHL IP DIET RECOMMENDATION 09/02/2018 SLP Diet Recommendations Thin liquid;Dysphagia 1 (Puree) solids Liquid Administration via Cup;Straw Medication Administration Whole meds with puree Compensations -- Postural Changes --   CHL IP OTHER RECOMMENDATIONS 09/02/2018 Recommended Consults -- Oral Care Recommendations Oral care BID Other Recommendations --   CHL IP FOLLOW UP RECOMMENDATIONS 09/02/2018 Follow up Recommendations Skilled Nursing facility   [facility] IP FREQUENCY AND DURATION 09/02/2018 Speech Therapy Frequency (ACUTE ONLY) min 1 x/week Treatment Duration --      CHL IP ORAL PHASE 09/02/2018 Oral Phase Impaired Oral - Pudding Teaspoon -- Oral - Pudding Cup -- Oral - Honey Teaspoon -- Oral - Honey Cup -- Oral - Nectar Teaspoon -- Oral - Nectar Cup -- Oral - Nectar Straw -- Oral - Thin Teaspoon -- Oral - Thin Cup -- Oral - Thin Straw WFL Oral - Puree Weak lingual manipulation;Lingual pumping;Reduced posterior propulsion;Delayed oral transit Oral - Mech Soft Weak lingual manipulation;Lingual pumping;Reduced posterior propulsion;Delayed oral transit Oral - Regular -- Oral - Multi-Consistency -- Oral - Pill -- Oral Phase - Comment --  CHL IP PHARYNGEAL PHASE 09/02/2018 Pharyngeal Phase WFL Pharyngeal- Pudding Teaspoon -- Pharyngeal -- Pharyngeal- Pudding Cup -- Pharyngeal -- Pharyngeal- Honey Teaspoon -- Pharyngeal -- Pharyngeal- Honey Cup -- Pharyngeal -- Pharyngeal- Nectar Teaspoon -- Pharyngeal -- Pharyngeal- Nectar Cup -- Pharyngeal -- Pharyngeal- Nectar Straw -- Pharyngeal -- Pharyngeal- Thin Teaspoon -- Pharyngeal -- Pharyngeal- Thin Cup -- Pharyngeal -- Pharyngeal- Thin Straw -- Pharyngeal -- Pharyngeal- Puree -- Pharyngeal -- Pharyngeal- Mechanical Soft -- Pharyngeal -- Pharyngeal- Regular -- Pharyngeal -- Pharyngeal- Multi-consistency --  Pharyngeal -- Pharyngeal- Pill -- Pharyngeal -- Pharyngeal Comment --  No flowsheet data found. [name] 09/02/2018, 3:56 PM               Anti-infectives: Anti-infectives (From admission, onward)   Start     Dose/Rate Route Frequency Ordered Stop   08/23/18 1400  piperacillin-tazobactam (ZOSYN) IVPB 2.25 g     2.25 g 100 mL/hr over 30 Minutes Intravenous Every 8 hours 08/23/18 0928 08/30/18 2230   08/21/18 1200  piperacillin-tazobactam (ZOSYN) IVPB 3.375 g  Status:  Discontinued     3.375 g 12.5 mL/hr over 240 Minutes Intravenous Every 8 hours 08/21/18 1102 08/23/18 0928   08/14/18 1000  hydroxychloroquine (PLAQUENIL) tablet 200 mg  Status:  Discontinued    Note to Pharmacy:  Take 1 tablet by mouth daily Monday through Friday only     200 mg Oral Once per day on Mon Tue Wed Thu Fri 08/14/18 0742 08/21/18 1147   08/13/18 2200  doxycycline (VIBRA-TABS) tablet 100 mg  Status:  Discontinued     100 mg Oral Every 12 hours 08/13/18 2134 08/13/18 2135   08/13/18 2200  doxycycline (VIBRAMYCIN) 100 mg in sodium chloride 0.9 % 250 mL IVPB  Status:  Discontinued     100 mg 125 mL/hr over 120 Minutes Intravenous Every 12 hours 08/13/18 2135 08/19/18 1702   08/13/18 2145  cefTRIAXone (ROCEPHIN) 1 g in sodium chloride 0.9 % 100 mL IVPB  Status:  Discontinued     1 g 200 mL/hr over 30 Minutes Intravenous Every 24 hours 08/13/18 2134 08/14/18 0743       Assessment/Plan POD13, s/p ex lapw/SBR, LOA, appendectomyfor SBO&ischemic/necroticbowel, 4/30 - PT -Extubated.Offpressors.Out of unit.AppreciateTRHhelp in her care -ContTPNtill inc intake. Pre-alb8.6 on 5/11, improving.Hopefully can be weaned soon.DYS1dietand as tolerated per speech(swallow study on 5/12) - Bloody BM5/7.Orrtanna held.no bloody BMs since then -continue TIDWTD dressing changes  -JP removed -completed zosyn x10 days, patient afebrile - Path benign. 58cm SB w/ hemorrhage and prominent  necrosis w/ viable margins.Appendix w/ fibrous obliteration and patchy acute serositis. - Mobilize and IS  Acute on chronic renal failure- Cr 2.53 stable,Defer fluid balance to TRH.Foley out  ICD/CAD/CHF-per cardiology,on amio (oral)  Acute Respiratory Failure- Off vent.Improving. Per TRH.   ABL Anemia-given 1 unit PRBCs forHg 6.7(5/9), post-transfusion hgb 7.9. hgb 7.0 this AM. No tachycardia. BP stable. - no signs of external bleeding this morning, no blood in stool. VSS  FEN -TPN,DYS1 (AAT per speech) VTE - SCDs, Mobilize as tolerating with PT. ID -zosyn 4/30>>5/9.WBC10.1, afebrile Foley - Removed 5/7 Follow up - Dr. Marlou Starks  Plan: Abdominal exam benign. Continue Dysphagia 1 diet, ok to advance if cleared by speech therapy. No signs of bleeding today, continue to monitor H/H.Mobilize as able. Continue TID dressing changes.   LOS: 21 days    Jillyn Ledger , [facility] 09/03/2018, 8:40 AM Pager: [phone]

## 2018-09-03 NOTE — Progress Notes (Signed)
  Speech Language Pathology Treatment: Dysphagia  Patient Details Name: [name] MRN: [id number] DOB: 25-May-1940 Today's Date: 09/03/2018 Time: 2620-3559 SLP Time Calculation (min) (ACUTE ONLY): 15 min  Assessment / Plan / Recommendation Clinical Impression  Patient seen to address dysphagia goals with thin liquids and puree/thin solids (ice cream). Patient consumed a few sips of thin liquids and 3 ounces of chocolate ice cream with only one instance of delayed cough. Patient's voice remained clear and SLP did not observe any of the inspiratory wheezing as in session a couple days ago. Per RN, patient isn't eating much but she has not noticed any overt s/s of difficulty with swallowing when she does.     HPI HPI: Patient is a 78 y.o. female with PMH: chronic CHF, CAD with remote MI, HTN, hyperlipidemia, CKD stage III, chronic anemia, GERD, who underwent ex-lap for SBO on 4/30. She was transferred to ICU post-op for severe hypotension and shock, was intubated from 4/30 to 5/3.      SLP Plan  Continue with current plan of care       Recommendations  Diet recommendations: Thin liquid;Dysphagia 1 (puree) Liquids provided via: Cup;Straw Medication Administration: Crushed with puree Supervision: Full supervision/cueing for compensatory strategies;Staff to assist with self feeding;Trained caregiver to feed patient Compensations: Slow rate;Small sips/bites;Minimize environmental distractions Postural Changes and/or Swallow Maneuvers: Seated upright 90 degrees;Upright 30-60 min after meal                Oral Care Recommendations: Oral care BID Follow up Recommendations: Skilled Nursing facility SLP Visit Diagnosis: Dysphagia, oral phase (R13.11) Plan: Continue with current plan of care       GO                [name] 09/03/2018, 5:18 PM   [name], MA, [facility] Pager: [phone]

## 2018-09-03 NOTE — Progress Notes (Deleted)
Office Visit Note  Patient: [name]             Date of Birth: 10/16/1940           MRN: [id number]             PCP: [name], MD Referring: [name], MD Visit Date: 09/11/2018 Occupation: @GUAROCC @  Subjective:  No chief complaint on file.   History of Present Illness: [name] is a 78 y.o. female ***   Activities of Daily Living:  Patient reports morning stiffness for *** {minute/hour:19697}.   Patient {ACTIONS;DENIES/REPORTS:21021675::"Denies"} nocturnal pain.  Difficulty dressing/grooming: {ACTIONS;DENIES/REPORTS:21021675::"Denies"} Difficulty climbing stairs: {ACTIONS;DENIES/REPORTS:21021675::"Denies"} Difficulty getting out of chair: {ACTIONS;DENIES/REPORTS:21021675::"Denies"} Difficulty using hands for taps, buttons, cutlery, and/or writing: {ACTIONS;DENIES/REPORTS:21021675::"Denies"}  No Rheumatology ROS completed.   PMFS History:  Patient Active Problem List   Diagnosis Date Noted   Acute respiratory failure with hypoxia ([place])    Protein-calorie malnutrition, severe 08/25/2018   Wide-complex tachycardia (HCC)    Acute renal failure superimposed on stage 3 chronic kidney disease ([place]) 08/18/2018   SBO (small bowel obstruction) ([place]) 08/14/2018   Pressure injury of skin 08/14/2018   CAP (community acquired pneumonia) 08/13/2018   AKI (acute kidney injury) ([place]) 08/10/2017   Hyperkalemia 08/10/2017   Acute blood loss anemia 09/20/2016   Ischemic cardiomyopathy 09/20/2016   Chronic combined systolic and diastolic CHF (congestive heart failure) ([place]) 09/20/2016   CKD (chronic kidney disease), stage IV ([place]) 09/20/2016   History of breast cancer 08/02/2016   Primary osteoarthritis of both knees 08/02/2016   Primary osteoarthritis of both feet 08/02/2016   History of anemia 07/17/2016   History of CHF (congestive heart failure) 06/04/2016   History of chronic kidney disease 06/04/2016   Symptomatic anemia 06/04/2016    Elevated sedimentation rate 03/03/2016   Idiopathic chronic gout of multiple sites without tophus 03/03/2016   Total knee replacement status, right 03/03/2016   High risk medication use 03/03/2016   Chronic kidney disease (CKD) stage G3b/A1, moderately decreased glomerular filtration rate (GFR) between 30-44 mL/min/1.73 square meter and albuminuria creatinine ratio less than 30 mg/g ([place]) 02/01/2016   CAD S/P remote PCI- no details 09/02/2014   Cardiomyopathy, ischemic-EF 30-35% March 2015 43/15/4008   Diastolic dysfunction-grade 2 09/02/2014   CHF exacerbation ([place]) 08/28/2014   Acute on chronic combined systolic and diastolic congestive heart failure ([place]) 08/28/2014   Iron deficiency anemia 08/20/2013   Malnutrition of moderate degree ([place]) 07/21/2013   PNA (pneumonia) 07/19/2013   HCAP (healthcare-associated pneumonia) 07/17/2013   Sepsis ([place]) 07/17/2013   ARF (acute renal failure) ([place]) 07/17/2013   Rheumatoid arthritis ([place]) 07/04/2013   Hypokalemia 07/03/2013   Chest pain 07/03/2013   Biventricular ICD in place (MDT 2014) 11/06/2012   Anemia of chronic disease    Breast cancer ([place])    Hypertension    Dyslipidemia 67/61/9509   Chronic systolic heart failure ([place]) 05/24/2009   Primary osteoarthritis of both hands 08/11/2007    Past Medical History:  Diagnosis Date   Anemia    Hgb of 9-10   Anemia of chronic disease    Hgb of 9-10 chronically; 06/2010: H&H-10.7/33.5, MCV-81, normal iron studies in 2010    Arteriosclerotic cardiovascular disease (ASCVD)    Remote PTCA by patient report; LBBB; associated cardiomyopathy, presumed ischemic with EF 40-45% previously, 20% in 06/2009; h/o clinical congestive heart failure; negative stress nuclear in 2009 with inferoseptal and apical scar   Automatic implantable cardioverter-defibrillator in situ    Breast cancer (  HCC)    breast   Chronic bronchitis (HCC)    Chronic renal disease, stage 3, moderately  decreased glomerular filtration rate (GFR) between 30-59 mL/min/1.73 square meter (HCC) 02/01/2016   Congestive heart failure (CHF) (HCC)    Elevated cholesterol    Elevated sed rate    GERD (gastroesophageal reflux disease)    GI bleed    Gout    HOH (hard of hearing)    Hyperlipidemia    Hypertension    Rheumatoid arthritis ([place])    UTI (urinary tract infection)     Family History  Problem Relation Age of Onset   Diabetes Father    Pancreatic cancer Father    Hypertension Brother    Past Surgical History:  Procedure Laterality Date   BI-VENTRICULAR IMPLANTABLE CARDIOVERTER DEFIBRILLATOR N/A 07/28/2012   Procedure: BI-VENTRICULAR IMPLANTABLE CARDIOVERTER DEFIBRILLATOR  (CRT-D);  Surgeon: Evans Lance, MD;  [facility] CATH LAB;  Service: Cardiovascular;  Laterality: N/A;   BI-VENTRICULAR IMPLANTABLE CARDIOVERTER DEFIBRILLATOR  (CRT-D)  07/28/2012   BOWEL RESECTION N/A 08/21/2018   Procedure: Small Bowel Resection;  Surgeon: Jovita Kussmaul, MD;  Location: [facility];  Service: General;  Laterality: N/A;   BREAST BIOPSY Bilateral    CATARACT EXTRACTION W/ INTRAOCULAR LENS IMPLANT Left    CATARACT EXTRACTION W/PHACO Right 09/15/2013   Procedure: CATARACT EXTRACTION PHACO AND INTRAOCULAR LENS PLACEMENT ([place]);  Surgeon: Elta Guadeloupe T. Gershon Crane, MD;  Location: [facility];  Service: Ophthalmology;  Laterality: Right;  CDE:  10.74   COLONOSCOPY N/A 03/04/2015   Procedure: COLONOSCOPY;  Surgeon: Rogene Houston, MD;  [facility];  Service: Endoscopy;  Laterality: N/A;  10:50    ESOPHAGOGASTRODUODENOSCOPY N/A 03/04/2015   Procedure: ESOPHAGOGASTRODUODENOSCOPY (EGD);  Surgeon: Rogene Houston, MD;  [facility];  Service: Endoscopy;  Laterality: N/A;   ESOPHAGOGASTRODUODENOSCOPY N/A 09/21/2016   Procedure: ESOPHAGOGASTRODUODENOSCOPY (EGD);  Surgeon: Rogene Houston, MD;  [facility];  Service: Endoscopy;  Laterality: N/A;   GIVENS CAPSULE STUDY N/A  09/22/2016   Procedure: GIVENS CAPSULE STUDY;  Surgeon: Rogene Houston, MD;  [facility];  Service: Endoscopy;  Laterality: N/A;   LAPAROTOMY N/A 08/21/2018   Procedure: EXPLORATORY LAPAROTOMY WITH LYSIS OF ADHESIONS;  Surgeon: Jovita Kussmaul, MD;  Location: [facility];  Service: General;  Laterality: N/A;   MASTECTOMY Left 1998   TENDON TRANSFER Right 08/15/2017   Procedure: TENDON TRANSFER RIGHT WRIST EXTENSORS AS NEEDED, ULNA RESECTION AND STABILIZATION;  Surgeon: Charlotte Crumb, MD;  Location: [facility];  Service: Orthopedics;  Laterality: Right;   TOTAL KNEE ARTHROPLASTY Right    [place]   TUBAL LIGATION     Social History   Social History Narrative   Married, lives with spouse   1 daughter, died in Jul 21, 1987 in a car accident   Retired - worked in [employer]   No recent travel   Immunization History  Administered Date(s) Administered   Influenza-Unspecified 12/22/2012, 02/09/2015     Objective: Vital Signs: There were no vitals taken for this visit.   Physical Exam   Musculoskeletal Exam: ***  CDAI Exam: CDAI Score: Not documented Patient Global Assessment: Not documented; Provider Global Assessment: Not documented Swollen: Not documented; Tender: Not documented Joint Exam   Not documented   There is currently no information documented on the homunculus. Go to the Rheumatology activity and complete the homunculus joint exam.  Investigation: No additional findings.  Imaging: Ct Abdomen Pelvis Wo Contrast  Result Date: 08/13/2018 CLINICAL  DATA:  Abdominal swelling poor appetite EXAM: CT ABDOMEN AND PELVIS WITHOUT CONTRAST TECHNIQUE: Multidetector CT imaging of the abdomen and pelvis was performed following the standard protocol without IV contrast. COMPARISON:  Report 04/25/2001 FINDINGS: Lower chest: Lung bases demonstrate no pleural effusion. Atelectasis or mild aspiration at the right base. Cardiomegaly with partially visualized intracardiac pacing leads.  Coronary vascular calcification. Small moderate hiatal hernia. Hepatobiliary: Multiple calcified gallstones. Mildly lobulated liver margin. No focal hepatic abnormality or biliary dilatation. Pancreas: Unremarkable. No pancreatic ductal dilatation or surrounding inflammatory changes. Spleen: Normal in size without focal abnormality. Adrenals/Urinary Tract: Adrenal glands are within normal limits. No hydronephrosis. Probable cyst in the mid to lower right kidney. The bladder is unremarkable. Stomach/Bowel: Fluid-filled stomach. Multiple loops of fluid-filled dilated small bowel measuring up to 3.4 cm. Distal small bowel is decompressed as is the colon. Poorly identified transition point due to lack of enteral contrast but suspected to be within the distal small bowel/pelvis. No intramural air. No definite bowel wall thickening. Vascular/Lymphatic: Extensive aortic atherosclerosis. No aneurysm. No significant adenopathy Reproductive: Uterus unremarkable. 3 x 2.4 cm hyperdense/partially calcified left pelvic sidewall mass. Other: No free air. Slightly dense fluid within the posterior pelvis. Musculoskeletal: Grade 1 anterolisthesis L4 on L5. Mild to moderate superior endplate deformity at L4, new since 2017 comparison radiographs. IMPRESSION: 1. Multiple loops of dilated fluid-filled small bowel, consistent with mechanical small bowel obstruction. Poorly identified transition point due to lack of contrast, suspect that transition point with in the distal ileum in the pelvis, given collapsed appearance of the terminal ileum and colon. No free air at this time. 2. Trace amount of slightly dense fluid within the pelvis, may reflect a small amount of hemorrhagic ascites. 3. Partially calcified/hyperdense 3 cm left pelvic sidewall mass, possibly a lymph node or a left adnexal mass. 4. Mild consolidation at the right lung base which may be secondary to atelectasis, mild pneumonia or aspiration. 5. Cardiomegaly 6. Gallstones.   Slightly lobulated liver margin, question cirrhosis Electronically Signed   By: Donavan Foil M.D.   On: 08/13/2018 21:25   Ct Abdomen Pelvis W Contrast  Result Date: 08/19/2018 CLINICAL DATA:  Abdominal pain, nasogastric tube placement EXAM: CT ABDOMEN AND PELVIS WITH CONTRAST TECHNIQUE: Multidetector CT imaging of the abdomen and pelvis was performed using the standard protocol following bolus administration of intravenous contrast. CONTRAST:  41mL OMNIPAQUE IOHEXOL 300 MG/ML  SOLN COMPARISON:  08/13/2018 FINDINGS: Lower chest: Small right pleural effusion with some dependent atelectasis/consolidation in the posterior right lower lobe. Transvenous pacing leads. Trace pericardial effusion. Hepatobiliary: Multiple subcentimeter partially calcified stones in the dependent aspect of the nondilated gallbladder. No liver lesion. No biliary ductal dilatation. Pancreas: Mild diffuse parenchymal atrophy without mass or ductal dilatation. Spleen: Normal in size without focal abnormality. Adrenals/Urinary Tract: 2.3 cm right lower pole renal cyst. No hydronephrosis. Normal adrenal glands. Urinary bladder is nondistended. Stomach/Bowel: Nasogastric tube extends to the gastric fundus. The stomach is incompletely distended. Multiple dilated small bowel loops with incomplete distal passage of oral contrast material. Transition point is generally localized to the right pelvis but difficult to discretely identified. Distal-most loops of ileum including TI are decompressed. The colon is decompressed. No pneumatosis. Vascular/Lymphatic: Heavy aortoiliac calcified plaque without aneurysm. Portal vein patent. No definite abdominal or pelvic adenopathy. Reproductive: Uterus unremarkable. Stable well-demarcated 3.7 cm hyperdense/partially calcified left pelvic sidewall mass. Right adnexal region is inseparable from adjacent bowel loops. Other: Trace pelvic ascites. No free air. Musculoskeletal: Stable L4 compression deformity. No  acute fracture or worrisome bone lesion. IMPRESSION: 1. High-grade small bowel obstruction, with transition point localized in the right pelvis. Possible etiologies include adhesions, appendicitis, or pelvic mass inseparable from adjacent bowel loops. 2. Small right pleural effusion. 3. Cholelithiasis. Electronically Signed   By: Lucrezia Europe M.D.   On: 08/19/2018 15:24   Dg Chest Port 1 View  Result Date: 09/01/2018 CLINICAL DATA:  Sudden increase in shortness of breath. Placed on BiPAP. EXAM: PORTABLE CHEST 1 VIEW COMPARISON:  08/31/2018. FINDINGS: Cardiomegaly with multi lead pacer LEFT subclavian approach stable. Central venous catheter tip stable, RIGHT atrium. Moderate vascular congestion, stable. Bibasilar subsegmental atelectasis, stable. IMPRESSION: Stable chest. Electronically Signed   By: Staci Righter M.D.   On: 09/01/2018 16:34   Dg Chest Port 1 View  Result Date: 08/31/2018 CLINICAL DATA:  Hypoxia EXAM: PORTABLE CHEST 1 VIEW COMPARISON:  08/25/2018 FINDINGS: Unchanged AP portable examination with mild, diffuse interstitial pulmonary opacity, likely edema, and small, layering bilateral pleural effusions. Cardiomegaly with left chest multi lead pacer defibrillator. Right neck vascular catheter. IMPRESSION: Unchanged AP portable examination with mild, diffuse interstitial pulmonary opacity, likely edema, and small, layering bilateral pleural effusions. Cardiomegaly with left chest multi lead pacer defibrillator. Right neck vascular catheter. Electronically Signed   By: Eddie Candle M.D.   On: 08/31/2018 19:20   Dg Chest Port 1 View  Result Date: 08/25/2018 CLINICAL DATA:  Acute respiratory failure with hypoxia. EXAM: PORTABLE CHEST 1 VIEW COMPARISON:  Chest x-ray from Aug 23, 2018. FINDINGS: Interval removal of the endotracheal tube. Unchanged enteric tube and right internal jugular central venous catheter. Unchanged left chest wall pacemaker. Stable cardiomegaly. New mild pulmonary vascular  congestion. Unchanged bibasilar atelectasis and small bilateral pleural effusions. No pneumothorax. No acute osseous abnormality. IMPRESSION: 1. New mild pulmonary vascular congestion. 2. Unchanged bibasilar atelectasis small bilateral pleural effusions. Electronically Signed   By: Titus Dubin M.D.   On: 08/25/2018 07:11   Dg Chest Port 1 View  Result Date: 08/23/2018 CLINICAL DATA:  Acute respiratory failure. EXAM: PORTABLE CHEST 1 VIEW COMPARISON:  Chest x-ray from yesterday. FINDINGS: Unchanged endotracheal and enteric tubes. Unchanged right internal jugular central venous catheter. Unchanged left chest wall pacemaker. Stable cardiomegaly. Normal pulmonary vascularity. Unchanged mild bibasilar atelectasis and small bilateral pleural effusions. No consolidation or pneumothorax. No acute osseous abnormality. IMPRESSION: 1. Unchanged mild bibasilar atelectasis and small bilateral pleural effusions. Electronically Signed   By: Titus Dubin M.D.   On: 08/23/2018 07:16   Dg Chest Port 1 View  Result Date: 08/22/2018 CLINICAL DATA:  Respiratory failure. EXAM: PORTABLE CHEST 1 VIEW COMPARISON:  08/21/2018 FINDINGS: Endotracheal tube remains with the tip approximately 2 cm above the carina. Stable positioning of central line at the SVC/RA junction. Gastric decompression tube extends below the diaphragm. Stable appearance of biventricular pacer/ICD. There is some interval improved aeration at both lung bases with bibasilar atelectasis remaining and probable small bilateral pleural effusions. No pneumothorax or overt pulmonary edema. IMPRESSION: Improved aeration of both lung bases with bibasilar atelectasis remaining and probable small bilateral pleural effusions. Electronically Signed   By: Aletta Edouard M.D.   On: 08/22/2018 07:47   Dg Chest Port 1 View  Result Date: 08/21/2018 CLINICAL DATA:  Status post laparotomy for small bowel obstruction. EXAM: PORTABLE CHEST 1 VIEW COMPARISON:  Chest x-ray dated  August 13, 2018. FINDINGS: Endotracheal tube tip 2.7 cm above the carina. Enteric tube in the stomach. Unchanged left chest wall pacemaker. Stable cardiomegaly. Atherosclerotic calcification of the aortic  arch. Normal pulmonary vascularity. Right greater than left basilar atelectasis with small bilateral pleural effusions. No pneumothorax. No acute osseous abnormality. IMPRESSION: 1. Appropriately positioned endotracheal and enteric tubes. 2. Bibasilar atelectasis with small bilateral pleural effusions. Electronically Signed   By: Titus Dubin M.D.   On: 08/21/2018 12:09   Dg Chest Port 1 View  Result Date: 08/13/2018 CLINICAL DATA:  Abdominal pain EXAM: PORTABLE CHEST 1 VIEW COMPARISON:  12/13/2015 FINDINGS: Left AICD remains in place, unchanged. Cardiomegaly with vascular congestion. Suspect early interstitial edema. Low volumes with bibasilar atelectasis and small effusions. No acute bony abnormality. IMPRESSION: Findings compatible with mild edema/CHF. Low volumes with bibasilar atelectasis and small effusions. Electronically Signed   By: Rolm Baptise M.D.   On: 08/13/2018 19:38   Dg Chest Port 1v Same Day  Result Date: 08/21/2018 CLINICAL DATA:  Central line placement EXAM: PORTABLE CHEST 1 VIEW COMPARISON:  Portable exam 1626 hours compared to 1148 hours FINDINGS: Tip of endotracheal tube projects 3.9 cm above carina. Nasogastric tube extends into stomach. RIGHT jugular central venous catheter with tip projecting over cavoatrial junction. LEFT subclavian AICD leads unchanged. Upper normal heart size. Atherosclerotic calcifications aorta. Bibasilar atelectasis and LEFT pleural effusion again seen. Upper lungs clear. No pneumothorax following central line placement. Bones demineralized. IMPRESSION: No pneumothorax following central line placement. Persistent bibasilar atelectasis and LEFT pleural effusion. Electronically Signed   By: Lavonia Dana M.D.   On: 08/21/2018 16:51   Dg Chest Port 1v Same  Day  Result Date: 08/13/2018 CLINICAL DATA:  Is NG tube placement EXAM: PORTABLE CHEST 1 VIEW COMPARISON:  08/13/2018 FINDINGS: NG tube is in place with the tip in the proximal stomach. Left AICD is unchanged. Cardiomegaly, bibasilar atelectasis. No overt edema or visible effusions. No acute bony abnormality. IMPRESSION: NG tube tip in the proximal stomach. Cardiomegaly, bibasilar atelectasis. Electronically Signed   By: Rolm Baptise M.D.   On: 08/13/2018 22:31   Dg Abd 2 Views  Result Date: 08/17/2018 CLINICAL DATA:  Bowel obstruction EXAM: ABDOMEN - 2 VIEW COMPARISON:  CT 08/13/2018 FINDINGS: Cardiac pacing device in the left lower chest. No free air on upright view. Worsened distension of small bowel loops, measuring up to 4.6 cm. Paucity of distal gas. IMPRESSION: Findings consistent with high-grade mechanical bowel obstruction with interval worsening of small bowel dilatation. No definite free air identified. Electronically Signed   By: Donavan Foil M.D.   On: 08/17/2018 15:52   Dg Abd Portable 1v  Result Date: 08/20/2018 CLINICAL DATA:  78 year old female with small bowel obstruction EXAM: PORTABLE ABDOMEN - 1 VIEW COMPARISON:  CT 08/19/2018 FINDINGS: Similar appearance of the dilated small bowel, with no significant change. Paucity of colonic gas. Gastric tube terminates left upper quadrant. IMPRESSION: Persisting small bowel obstruction without significant change. Gastric tube terminates in the left upper quadrant. Electronically Signed   By: Corrie Mckusick D.O.   On: 08/20/2018 13:38   Dg Swallowing Func-speech Pathology  Result Date: 09/02/2018 Objective Swallowing Evaluation: Type of Study: MBS-Modified Barium Swallow Study  Patient Details Name: [name] MRN: [id number] Date of Birth: October 14, 1940 Today's Date: 09/02/2018 Time: SLP Start Time (ACUTE ONLY): 1315 -SLP Stop Time (ACUTE ONLY): 1340 SLP Time Calculation (min) (ACUTE ONLY): 25 min Past Medical History: Past Medical History:  Diagnosis Date  Anemia   Hgb of 9-10  Anemia of chronic disease   Hgb of 9-10 chronically; 06/2010: H&H-10.7/33.5, MCV-81, normal iron studies in 2010   Arteriosclerotic cardiovascular disease (ASCVD)  Remote PTCA by patient report; LBBB; associated cardiomyopathy, presumed ischemic with EF 40-45% previously, 20% in 06/2009; h/o clinical congestive heart failure; negative stress nuclear in 2009 with inferoseptal and apical scar  Automatic implantable cardioverter-defibrillator in situ   Breast cancer (HCC)   breast  Chronic bronchitis (HCC)   Chronic renal disease, stage 3, moderately decreased glomerular filtration rate (GFR) between 30-59 mL/min/1.73 square meter ([place]) 02/01/2016  Congestive heart failure (CHF) (HCC)   Elevated cholesterol   Elevated sed rate   GERD (gastroesophageal reflux disease)   GI bleed   Gout   HOH (hard of hearing)   Hyperlipidemia   Hypertension   Rheumatoid arthritis ([place])   UTI (urinary tract infection)  Past Surgical History: Past Surgical History: Procedure Laterality Date  BI-VENTRICULAR IMPLANTABLE CARDIOVERTER DEFIBRILLATOR N/A 07/28/2012  Procedure: BI-VENTRICULAR IMPLANTABLE CARDIOVERTER DEFIBRILLATOR  (CRT-D);  Surgeon: Evans Lance, MD;  Location: [facility] CATH LAB;  Service: Cardiovascular;  Laterality: N/A;  BI-VENTRICULAR IMPLANTABLE CARDIOVERTER DEFIBRILLATOR  (CRT-D)  07/28/2012  BOWEL RESECTION N/A 08/21/2018  Procedure: Small Bowel Resection;  Surgeon: Jovita Kussmaul, MD;  Location: [place];  Service: General;  Laterality: N/A;  BREAST BIOPSY Bilateral   CATARACT EXTRACTION W/ INTRAOCULAR LENS IMPLANT Left   CATARACT EXTRACTION W/PHACO Right 09/15/2013  Procedure: CATARACT EXTRACTION PHACO AND INTRAOCULAR LENS PLACEMENT ([place]);  Surgeon: Elta Guadeloupe T. Gershon Crane, MD;  [facility];  Service: Ophthalmology;  Laterality: Right;  CDE:  10.74  COLONOSCOPY N/A 03/04/2015  Procedure: COLONOSCOPY;  Surgeon: Rogene Houston, MD;  [facility];  Service:  Endoscopy;  Laterality: N/A;  10:50   ESOPHAGOGASTRODUODENOSCOPY N/A 03/04/2015  Procedure: ESOPHAGOGASTRODUODENOSCOPY (EGD);  Surgeon: Rogene Houston, MD;  [facility];  Service: Endoscopy;  Laterality: N/A;  ESOPHAGOGASTRODUODENOSCOPY N/A 09/21/2016  Procedure: ESOPHAGOGASTRODUODENOSCOPY (EGD);  Surgeon: Rogene Houston, MD;  [facility];  Service: Endoscopy;  Laterality: N/A;  GIVENS CAPSULE STUDY N/A 09/22/2016  Procedure: GIVENS CAPSULE STUDY;  Surgeon: Rogene Houston, MD;  [facility];  Service: Endoscopy;  Laterality: N/A;  LAPAROTOMY N/A 08/21/2018  Procedure: EXPLORATORY LAPAROTOMY WITH LYSIS OF ADHESIONS;  Surgeon: Jovita Kussmaul, MD;  Location: [facility];  Service: General;  Laterality: N/A;  MASTECTOMY Left 1998  TENDON TRANSFER Right 08/15/2017  Procedure: TENDON TRANSFER RIGHT WRIST EXTENSORS AS NEEDED, ULNA RESECTION AND STABILIZATION;  Surgeon: Charlotte Crumb, MD;  Location: [place];  Service: Orthopedics;  Laterality: Right;  TOTAL KNEE ARTHROPLASTY Right   Dr.Harrison  TUBAL LIGATION   HPI: Patient is a 78 y.o. female with PMH: chronic CHF, CAD with remote MI, HTN, hyperlipidemia, CKD stage III, chronic anemia, GERD, who underwent ex-lap for SBO on 4/30. She was transferred to ICU post-op for severe hypotension and shock, was intubated from 4/30 to 5/3.  Subjective: pleasant, cooperative, hard of hearing Assessment / Plan / Recommendation CHL IP CLINICAL IMPRESSIONS 09/02/2018 Clinical Impression Pt presents with a primary oral dysphagia marked by difficulty manipulating mechanical soft solids, requiring eventual manual removal from oral cavity; purees required lingual pumping with eventual transition into pharynx.  Once material reached valleculae, the pharyngeal swallow was triggered and solids passed readily through UES into esophagus without difficulty.  There was no pharyngeal residue noted throughout study.  Thin liquids were consumed with occasional  high, transient penetration (Penetration-Aspiration Scale score of #2, considered WNL).  There was no aspiration, even when taxing the mechanical system with large, successive and mixed boluses.  Recommend continuing a pureed/dysphagia 1 diet; give  meds whole in puree; allow thin liquids; assist with meals/self-feeding as needed.  SLP Visit Diagnosis Dysphagia, oral phase (R13.11) Attention and concentration deficit following -- Frontal lobe and executive function deficit following -- Impact on safety and function Mild aspiration risk   CHL IP TREATMENT RECOMMENDATION 09/02/2018 Treatment Recommendations Therapy as outlined in treatment plan below   Prognosis 08/26/2018 Prognosis for Safe Diet [employer] Barriers to Reach Goals -- Barriers/Prognosis Comment -- [facility] IP DIET RECOMMENDATION 09/02/2018 SLP Diet Recommendations Thin liquid;Dysphagia 1 (Puree) solids Liquid Administration via Cup;Straw Medication Administration Whole meds with puree Compensations -- Postural Changes --   [facility] IP OTHER RECOMMENDATIONS 09/02/2018 Recommended Consults -- Oral Care Recommendations Oral care BID Other Recommendations --   [facility] IP FOLLOW UP RECOMMENDATIONS 09/02/2018 Follow up Recommendations Skilled Nursing facility   [facility] IP FREQUENCY AND DURATION 09/02/2018 Speech Therapy Frequency (ACUTE ONLY) min 1 x/week Treatment Duration --      [facility] IP ORAL PHASE 09/02/2018 Oral Phase Impaired Oral - Pudding Teaspoon -- Oral - Pudding Cup -- Oral - Honey Teaspoon -- Oral - Honey Cup -- Oral - Nectar Teaspoon -- Oral - Nectar Cup -- Oral - Nectar Straw -- Oral - Thin Teaspoon -- Oral - Thin Cup -- Oral - Thin Straw WFL Oral - Puree Weak lingual manipulation;Lingual pumping;Reduced posterior propulsion;Delayed oral transit Oral - Mech Soft Weak lingual manipulation;Lingual pumping;Reduced posterior propulsion;Delayed oral transit Oral - Regular -- Oral - Multi-Consistency -- Oral - Pill -- Oral Phase - Comment --  [facility] IP PHARYNGEAL PHASE  09/02/2018 Pharyngeal Phase WFL Pharyngeal- Pudding Teaspoon -- Pharyngeal -- Pharyngeal- Pudding Cup -- Pharyngeal -- Pharyngeal- Honey Teaspoon -- Pharyngeal -- Pharyngeal- Honey Cup -- Pharyngeal -- Pharyngeal- Nectar Teaspoon -- Pharyngeal -- Pharyngeal- Nectar Cup -- Pharyngeal -- Pharyngeal- Nectar Straw -- Pharyngeal -- Pharyngeal- Thin Teaspoon -- Pharyngeal -- Pharyngeal- Thin Cup -- Pharyngeal -- Pharyngeal- Thin Straw -- Pharyngeal -- Pharyngeal- Puree -- Pharyngeal -- Pharyngeal- Mechanical Soft -- Pharyngeal -- Pharyngeal- Regular -- Pharyngeal -- Pharyngeal- Multi-consistency -- Pharyngeal -- Pharyngeal- Pill -- Pharyngeal -- Pharyngeal Comment --  No flowsheet data found. [name] 09/02/2018, 3:56 PM              [place] Ekg Site Rite  Result Date: 08/21/2018 If [facility] image not attached, placement could not be confirmed due to current cardiac rhythm.   Recent Labs: Lab Results  Component Value Date   WBC 10.1 09/03/2018   HGB 7.0 (L) 09/03/2018   PLT 211 09/03/2018   NA 142 09/03/2018   K 3.7 09/03/2018   CL 105 09/03/2018   CO2 25 09/03/2018   GLUCOSE 213 (H) 09/03/2018   BUN 138 (H) 09/03/2018   CREATININE 2.53 (H) 09/03/2018   BILITOT 2.7 (H) 09/03/2018   ALKPHOS 323 (H) 09/03/2018   AST 52 (H) 09/03/2018   ALT 34 09/03/2018   PROT 5.5 (L) 09/03/2018   ALBUMIN 1.4 (L) 09/03/2018   CALCIUM 8.7 (L) 09/03/2018   GFRAA 20 (L) 09/03/2018    Speciality Comments: PLQ eye exam: 03/04/18 WNL  [facility].   Procedures:  No procedures performed Allergies: Macrobid [nitrofurantoin macrocrystal]   Assessment / Plan:     Visit Diagnoses: No diagnosis found.   Orders: No orders of the defined types were placed in this encounter.  No orders of the defined types were placed in this encounter.   Face-to-face time spent with patient was *** minutes. Greater than 50% of time was spent in  counseling and coordination of care.  Follow-Up  Instructions: No follow-ups on file.   Ofilia Neas, PA-C  Note - This record has been created using Dragon software.  Chart creation errors have been sought, but may not always  have been located. Such creation errors do not reflect on  the standard of medical care.

## 2018-09-03 NOTE — Progress Notes (Addendum)
PHARMACY - ADULT TOTAL PARENTERAL NUTRITION CONSULT NOTE   Pharmacy Consult for TPN Indication: s/p ex lap with SBR for SBO and ischemic/necroticbowel  Patient Measurements: Height: 5\' 4"  (162.6 cm) Weight: 160 lb 15 oz (73 kg) IBW/kg (Calculated) : 54.7 TPN AdjBW (KG): 64.8 Body mass index is 27.62 kg/m.  Assessment:  78 year old female with PMH significant for anemia, CAD s/p AICD placement, breast cancer, CKD III, CHF, HLD, impaired hearing, and RA. She presented to [facility] on 4/23 with CAP and SBO. CT on 4/28 show persistent SBO and patient was transferred to [facility] for surgery. On 4/30, an ex lap, lysis of adhesions and small bowel resection was performed. As of 5/4, Patient has been ~12 days without nutrition and pharmacy was consulted 5/4 for TPN with plan for continued bowel rest and NG until bowel function returns.  GI: NGT removed 5/5, JP drain removed 5/12. PPI IV. Pre-albumin <5 >>8.6. No N/V. +Flatus. LBM 5/12 (flexiseal in place, loose stool). Dyphagia 1 diet initiated 5/9. Endo: No history of diabetes. CBGs 180-272 with steroids. Insulin requirements in the past 24 hours: 26 units of moderate SSI. Lytes: K 3.7, Phos 3.8, Mg 1.8; Corr Ca 10.9 (alb 1.2), CO2 25, s/p Bicarb drip (maximizing acetate in TPN) and bicarb tablet added. Renal: Scr 2.53 (unchanged), BUN up 138 in setting of IV Lasix and AKI, off CRRT - no plan for dialysis. UOP 2.1 mL/kg/hr. Net +7L.  Pulm: Extubated on 5/3. Required BiPAP yesterday, now back on 2L [place]. Steroids + Lasix. Cards: Amiodarone po (s/p VT arrest earlier in admission); V-paced at 70, BP ok. Hepatobil: AST up 52; T Bili down to 2.7; ALT wnl. TG 81. Neuro: oriented to person only, complains of abdominal pain ID: WBC within normal limits, afebrile off Zosyn.   TPN Access: CVC triple lumen placed 4/30 TPN start date: 5/4 Nutritional Goals (per RD recommendation on 5/11): Kcal: 2000-2200 kcal Protein:  100-120 grams Fluid:  >/= 2 L/day   Goal TPN  rate is 60 ml/hr (provides 106 g of protein, 288 g of dextrose, and 60 g of lipids which provides 2002 kCals per day, meeting 100% of patient needs) -- reduced protein to lower end of goal with high BUN and concentrated to reduce volume.   Current Nutrition:  Dysphagia 1, puree solids diet + Magic cup - charted 100% of a meals yesterday, but 15% this AM TPN at goal  Plan:  Continue TPN at 60 mL/hr  This TPN provides  106g of protein, 288g of dextrose, and 60g of lipids which provides 2002 kCals per day, meeting 100% of patient's protein and calorie needs. Electrolytes in TPN: Per discussion with Dr. Algis Liming, ok to add back lytes in TPN cautiously- continue current reduced amounts. Remove calcium. Maximize acetate. Add MVI to TPN - trace elements are on back order, will only place in TPN on Monday, Wednesday and Friday *CBGs uncontrolled -possible wean of TPN soon with increased po intake. Feel like this is steroid-induced as CBG's were controlled on TPN at goal prior to start of TPN - would prefer to target outside of TPN. Increase to resistant SSI q6h and adjust as needed.  Monitor TPN labs Follow-up renal function and diuresis. Follow-up toleration of diet/intake amounts and ability to wean TPN Re-evaluate volume status - TPN currently concentrated  Sloan Leiter, PharmD, BCPS, BCCCP Clinical Pharmacist Please refer to [facility] for [place] numbers 09/03/2018, 7:13 AM

## 2018-09-03 NOTE — Progress Notes (Signed)
PROGRESS NOTE    [name]  ZOX:[id number] DOB: [birth year] DOA: 08/13/2018 PCP: [name], MD   Brief Narrative:  HPI on 08/13/2018 by Dr. Cherylann Parr is a 78 y.o. female with medical history significant of anemia chronic disease, ASCVD, AICD placement, history of breast cancer, chronic bronchitis, stage III chronic renal disease, systolic heart failure, hyperlipidemia, GERD, history of GI bleed, gout, hypertension, rheumatoid arthritis, impaired hearing, history of UTI who is coming to the emergency department with complaints of abdominal pain apparently for the past 3 days associated with decreased appetite, several episodes of diarrhea, nausea, but no emesis.  She denies fever, cough, sore throat, chest pain, dysuria, frequency, hematuria, melena or hematochezia.  She denies polyuria, polydipsia, polyphagia or blurred vision.  History is limited by the patient severe hearing impairment.  Interim history Patient was admitted with small bowel obstruction, failed conservative management and was transferred to [name].  General surgery consulted and appreciated, patient underwent ex lap with lysis of adhesions, bowel resection for ischemic bowel.  She continued on the ventilator was put in ICU as she was also hypotensive and started on pressors.  Patient was also shocked for VT.  She developed progressive renal failure, oliguria.  She was weaned off of pressors and extubated. And TRH assumed care. Started on antibiotics for possible pneumonia.  Patient now with prolonged postop ileus, ongoing TNA, diet gradually being advanced per speech therapy.  She was also noted to have a self-limiting GI bleed.  Patient had acute respiratory distress on 09/01/2018 with decompensated CHF and upper airway wheezing.  PCCM consulted.  Assessment & Plan   Acute respiratory failure with hypoxia -Noted on 09/01/2018.  Suspected to be due to decompensated CHF and a  component of bronchospasm. -PCCM consulted, patient was briefly placed on BiPAP for 3 to 4 hours, given IV Lasix and Solu-Medrol -Has now improved, continuing IV Lasix.  Tapering steroids at this time.  Continuing Bronchodilators and BiPAP as needed  Septic shock -Resolved and stable -Status post VDRF and pressure support.  Patient extubated on 08/24/2018.  Weaned off pressors on 08/23/2018  Small bowel obstruction, ischemic:/Postop ileus -Patient failed conservative management of bowel obstruction at [facility] -General surgery consulted and appreciated -Status post expiratory laparotomy for SBO and small bowel section of ischemic necrotic bowel -Completed 10 days of IV Zosyn -Currently on TNA and awaiting for bowel function to return -NG tube was discontinued -Wound VAC removed on 08/27/2018.  Continue wet to dry dressing changes -Currently on dysphagia 1 diet and tolerating -Patient did have 2 bloody bowel movements on 08/28/2019 but none since, felt to be bleeding from staple line.  Still has JP drain in place -Noted to have diarrhea on 08/28/2018, Flexi-Seal was placed.  Low index of suspicion for C. difficile in the absence of abdominal pain or leukocytosis.  Diarrhea has now resolved and Flexi-Seal discontinued  Diarrhea -As above, Has now resolved. -Suspect related to resolving postoperative ileus  Acute lower GI bleed -As per surgery, felt to be due to staple line -Heparin was discontinued, currently on SCDs for DVT prophylaxis -GI bleed has resolved -Hemoglobin currently 7, will obtain repeat H&H  Acute kidney injury on chronic kidney disease, stage III -Baseline creatinine approximately 1.5-1.8, peaked to 3.2 in the setting of septic shock, ATN and ischemic bowel -Creatinine currently stable in the 2.5 range, possibly new baseline -Continue to monitor BMP  Hypophosphatemia/hypomagnesemia -Replaced, will continue to monitor   Non-anion  gap metabolic acidosis -Due to diarrhea and mild  hyperchloremia -Resolved with sodium bicarbonate  Acute on chronic systolic CHF -Echocardiogram 08/19/2018 shows an EF of 30%.  Inferior basal and inferoseptal akinesis.  Systolic function indeterminate. -Was off of diuretics, ACE inhibitor in the setting of acute kidney injury and septic shock -Patient developed progressive dyspnea and wheezing on 08/31/2018.  Thought to be due to fluids as well as TNA -Patient was given IV Lasix on 09/01/2018 with good urine output -Continue to monitor intake and output, daily weights  Acute on chronic anemia -Patient did receive 1 unit PRBC -Anemia panel suggestive of anemia of chronic disease -Hemoglobin currently 7, repeat H&H pending  Paroxysmal atrial fibrillation/transient NSVT -Status post DC cardioversion 08/20/2018, currently in sinus rhythm -No anticoagulation given GI bleeding issues -Continue amiodarone, Coreg -Need to follow-up with cardiology, Dr. [name]  Severe protein calorie malnutrition -Currently on TNA and dysphagia 1 diet  CAD -Complaints of chest pain -Continue Coreg  Thrombocytopenia -recovered and resolved  Hyperglycemia -Suspect secondary to steroids and tapering. -Continue insulin sliding scale and CBG monitoring -Will obtain Hemoglobin A1c   Physical deconditioning -Likely secondary to acute illness, prolonged hospitalization, underlying medical problems -PT and OT recommending SNF  DVT Prophylaxis  SCDs  Code Status: Full  Family Communication: None at bedside.  Disposition Plan: Admitted. Pending improvement in nutritional status and weaning of TPN.  SNF on discharge  Consultants PCCM General surgery Cardiology  Procedures  Cardioversion 08/20/2018 Ex laparotomy with lysis of adhesion, bowel resection for ischemic bowel  Antibiotics   Anti-infectives (From admission, onward)   Start     Dose/Rate Route Frequency Ordered Stop   08/23/18 1400  piperacillin-tazobactam (ZOSYN) IVPB 2.25 g     2.25  g 100 mL/hr over 30 Minutes Intravenous Every 8 hours 08/23/18 0928 08/30/18 2230   08/21/18 1200  piperacillin-tazobactam (ZOSYN) IVPB 3.375 g  Status:  Discontinued     3.375 g 12.5 mL/hr over 240 Minutes Intravenous Every 8 hours 08/21/18 1102 08/23/18 0928   08/14/18 1000  hydroxychloroquine (PLAQUENIL) tablet 200 mg  Status:  Discontinued    Note to Pharmacy:  Take 1 tablet by mouth daily Monday through Friday only     200 mg Oral Once per day on Mon Tue Wed Thu Fri 08/14/18 0742 08/21/18 1147   08/13/18 2200  doxycycline (VIBRA-TABS) tablet 100 mg  Status:  Discontinued     100 mg Oral Every 12 hours 08/13/18 2134 08/13/18 2135   08/13/18 2200  doxycycline (VIBRAMYCIN) 100 mg in sodium chloride 0.9 % 250 mL IVPB  Status:  Discontinued     100 mg 125 mL/hr over 120 Minutes Intravenous Every 12 hours 08/13/18 2135 08/19/18 1702   08/13/18 2145  cefTRIAXone (ROCEPHIN) 1 g in sodium chloride 0.9 % 100 mL IVPB  Status:  Discontinued     1 g 200 mL/hr over 30 Minutes Intravenous Every 24 hours 08/13/18 2134 08/14/18 0743      Subjective:   [name] seen and examined today.  No complaints today.  Wonders if she is doing better today.  She denies current chest pain, shortness of breath, nausea or vomiting, abdominal pain, dizziness or headache. Objective:   Vitals:   09/02/18 2012 09/02/18 2336 09/03/18 0500 09/03/18 0821  BP: (!) 124/45 (!) 110/51 (!) 125/53 (!) 130/51  Pulse: 75 70 72 74  Resp: 16 19 14 16   Temp: 98.3 F (36.8 C) 98.3 F (36.8 C) 97.7 F (36.5 C) 98 F (  36.7 C)  TempSrc: Oral Oral Oral Oral  SpO2: 97% 98% 97% 100%  Weight:      Height:        Intake/Output Summary (Last 24 hours) at 09/03/2018 1042 Last data filed at 09/03/2018 0932 Gross per 24 hour  Intake 420 ml  Output 1600 ml  Net -1180 ml   Filed Weights   08/31/18 1841 09/01/18 0933 09/02/18 0650  Weight: 76.4 kg 76.7 kg 73 kg    Exam  General: Well developed, chronically ill-appearing,  NAD  HEENT: NCAT, mucous membranes moist.   Cardiovascular: S1 S2 auscultated, RRR, no murmur  Respiratory: Clear to auscultation bilaterally with equal chest rise  Abdomen: Soft, nontender, nondistended, + bowel sounds, seen in place  Extremities: warm dry without cyanosis clubbing. + LE edema.  Edema LUE/hand.   Neuro: AAOx3, very hard of hearing, otherwise nonfocal  Psych: Normal affect and demeanor   Data Reviewed: I have personally reviewed following labs and imaging studies  CBC: Recent Labs  Lab 08/30/18 0321  08/31/18 0524 09/01/18 0628 09/02/18 0814 09/03/18 0440 09/03/18 0701  WBC 10.4  --  10.1 11.2* 9.0 10.1  --   NEUTROABS  --   --   --  8.6*  --   --   --   HGB 6.7*   < > 7.5* 7.3* 7.3* 7.0* 7.0*  HCT 19.1*   < > 22.0* 22.1* 22.4* 21.7* 21.8*  MCV 77.3*  --  78.9* 81.9 82.7 84.8  --   PLT 119*  --  135* 162 193 211  --    < > = values in this interval not displayed.   Basic Metabolic Panel: Recent Labs  Lab 08/28/18 0458 08/28/18 1500  08/30/18 0321 08/31/18 0524 09/01/18 0628 09/02/18 0500 09/03/18 0440  NA 139 139   < > 141 142 143 140 142  K 3.7 3.6   < > 4.0 4.1 4.3 4.0 3.7  CL 115* 115*   < > 113* 110 108 103 105  CO2 15* 14*   < > 19* 20* 23 21* 25  GLUCOSE 128* 151*   < > 125* 121* 117* 218* 213*  BUN 68* 68*   < > 74* 81* 95* 117* 138*  CREATININE 2.50* 2.51*   < > 2.42* 2.34* 2.52* 2.52* 2.53*  CALCIUM 7.8* 7.7*   < > 8.0* 8.1* 8.4* 8.7* 8.7*  MG 1.6* 2.0  --   --  1.8 1.8  --   --   PHOS 2.4* 2.5  --   --  3.3 3.8  --   --    < > = values in this interval not displayed.   GFR: Estimated Creatinine Clearance: 17.6 mL/min (A) (by C-G formula based on SCr of 2.53 mg/dL (H)). Liver Function Tests: Recent Labs  Lab 08/28/18 0458 08/29/18 0240 09/01/18 0628 09/03/18 0440  AST 51* 40 47* 52*  ALT 18 19 24  34  ALKPHOS 223* 204* 312* 323*  BILITOT 2.2* 2.3* 3.1* 2.7*  PROT 4.5* 4.7* 5.3* 5.5*  ALBUMIN 1.1* 1.1* 1.2* 1.4*   No  results for input(s): LIPASE, AMYLASE in the last 168 hours. No results for input(s): AMMONIA in the last 168 hours. Coagulation Profile: No results for input(s): INR, PROTIME in the last 168 hours. Cardiac Enzymes: No results for input(s): CKTOTAL, CKMB, CKMBINDEX, TROPONINI in the last 168 hours. BNP (last 3 results) No results for input(s): PROBNP in the last 8760 hours. HbA1C: No results for input(s): HGBA1C  in the last 72 hours. CBG: Recent Labs  Lab 09/02/18 1150 09/02/18 1836 09/02/18 2342 09/03/18 0515 09/03/18 0801  GLUCAP 283* 272* 185* 203* 175*   Lipid Profile: Recent Labs    09/01/18 0627  TRIG 81   Thyroid Function Tests: No results for input(s): TSH, T4TOTAL, FREET4, T3FREE, THYROIDAB in the last 72 hours. Anemia Panel: No results for input(s): VITAMINB12, FOLATE, FERRITIN, TIBC, IRON, RETICCTPCT in the last 72 hours. Urine analysis:    Component Value Date/Time   COLORURINE AMBER (A) 08/13/2018 1849   APPEARANCEUR CLOUDY (A) 08/13/2018 1849   LABSPEC 1.016 08/13/2018 1849   PHURINE 5.0 08/13/2018 1849   GLUCOSEU NEGATIVE 08/13/2018 1849   HGBUR MODERATE (A) 08/13/2018 1849   BILIRUBINUR NEGATIVE 08/13/2018 1849   KETONESUR NEGATIVE 08/13/2018 1849   PROTEINUR 30 (A) 08/13/2018 1849   UROBILINOGEN 0.2 07/17/2013 2240   NITRITE NEGATIVE 08/13/2018 1849   LEUKOCYTESUR TRACE (A) 08/13/2018 1849   Sepsis Labs: @LABRCNTIP (procalcitonin:4,lacticidven:4)  ) Recent Results (from the past 240 hour(s))  Novel Coronavirus, NAA (hospital order; send-out to ref lab)     Status: None   Collection Time: 09/01/18 10:10 AM  Result Value Ref Range Status   SARS-CoV-2, NAA NOT DETECTED NOT DETECTED Final    Comment: (NOTE) This test was developed and its performance characteristics determined by [employer]. This test has not been FDA cleared or approved. This test has been authorized by FDA under an Emergency Use Authorization (EUA). This test is only  authorized for the duration of time the declaration that circumstances exist justifying the authorization of the emergency use of in vitro diagnostic tests for detection of SARS-CoV-2 virus and/or diagnosis of COVID-19 infection under section 564(b)(1) of the Act, 21 U.S.C. 419FXT-0(W)(4), unless the authorization is terminated or revoked sooner. When diagnostic testing is negative, the possibility of a false negative result should be considered in the context of a patient's recent exposures and the presence of clinical signs and symptoms consistent with COVID-19. An individual without symptoms of COVID-19 and who is not shedding SARS-CoV-2 virus would expect to have a negative (not detected) result in this assay. Performed  At: [facility] [address] [place], [place] [id number] Rush Farmer MD ME:[id number]    [place]  Final    Comment: Performed at [facility], [place] [address], [place], [place] [address]      Radiology Studies: Dg Chest Port 1 View  Result Date: 09/01/2018 CLINICAL DATA:  Sudden increase in shortness of breath. Placed on BiPAP. EXAM: PORTABLE CHEST 1 VIEW COMPARISON:  08/31/2018. FINDINGS: Cardiomegaly with multi lead pacer LEFT subclavian approach stable. Central venous catheter tip stable, RIGHT atrium. Moderate vascular congestion, stable. Bibasilar subsegmental atelectasis, stable. IMPRESSION: Stable chest. Electronically Signed   By: Staci Righter M.D.   On: 09/01/2018 16:34   Dg Swallowing Func-speech Pathology  Result Date: 09/02/2018 Objective Swallowing Evaluation: Type of Study: MBS-Modified Barium Swallow Study  Patient Details Name: [name] MRN: [id number] Date of Birth: [birth year] Today's Date: 09/02/2018 Time: SLP Start Time (ACUTE ONLY): 1315 -SLP Stop Time (ACUTE ONLY): 1340 SLP Time Calculation (min) (ACUTE ONLY): 25 min Past Medical History: Past Medical History: Diagnosis Date  Anemia   Hgb of 9-10   Anemia of chronic disease   Hgb of 9-10 chronically; 06/2010: H&H-10.7/33.5, MCV-81, normal iron studies in 2010   Arteriosclerotic cardiovascular disease (ASCVD)   Remote PTCA by patient report; LBBB; associated cardiomyopathy, presumed ischemic with EF 40-45% previously,  20% in 06/2009; h/o clinical congestive heart failure; negative stress nuclear in 2009 with inferoseptal and apical scar  Automatic implantable cardioverter-defibrillator in situ   Breast cancer (HCC)   breast  Chronic bronchitis (HCC)   Chronic renal disease, stage 3, moderately decreased glomerular filtration rate (GFR) between 30-59 mL/min/1.73 square meter (HCC) 02/01/2016  Congestive heart failure (CHF) (HCC)   Elevated cholesterol   Elevated sed rate   GERD (gastroesophageal reflux disease)   GI bleed   Gout   HOH (hard of hearing)   Hyperlipidemia   Hypertension   Rheumatoid arthritis ([place])   UTI (urinary tract infection)  Past Surgical History: Past Surgical History: Procedure Laterality Date  BI-VENTRICULAR IMPLANTABLE CARDIOVERTER DEFIBRILLATOR N/A 07/28/2012  Procedure: BI-VENTRICULAR IMPLANTABLE CARDIOVERTER DEFIBRILLATOR  (CRT-D);  Surgeon: Evans Lance, MD;  Location: [facility] CATH LAB;  Service: Cardiovascular;  Laterality: N/A;  BI-VENTRICULAR IMPLANTABLE CARDIOVERTER DEFIBRILLATOR  (CRT-D)  07/28/2012  BOWEL RESECTION N/A 08/21/2018  Procedure: Small Bowel Resection;  Surgeon: Jovita Kussmaul, MD;  Location: [place];  Service: General;  Laterality: N/A;  BREAST BIOPSY Bilateral   CATARACT EXTRACTION W/ INTRAOCULAR LENS IMPLANT Left   CATARACT EXTRACTION W/PHACO Right 09/15/2013  Procedure: CATARACT EXTRACTION PHACO AND INTRAOCULAR LENS PLACEMENT ([place]);  Surgeon: Elta Guadeloupe T. Gershon Crane, MD;  Location: [facility] OR[facility];  Service: Ophthalmology;  Laterality: Right;  CDE:  10.74  COLONOSCOPY N/A 03/04/2015  Procedure: COLONOSCOPY;  Surgeon: Rogene Houston, MD;  Location: [facility] END[facility] SU[facility];  Service: Endoscopy;  Laterality: N/A;  10:50    ESOPHAGOGASTRODUODENOSCOPY N/A 03/04/2015  Procedure: ESOPHAGOGASTRODUODENOSCOPY (EGD);  Surgeon: Rogene Houston, MD;  Location: [facility];  Service: Endoscopy;  Laterality: N/A;  ESOPHAGOGASTRODUODENOSCOPY N/A 09/21/2016  Procedure: ESOPHAGOGASTRODUODENOSCOPY (EGD);  Surgeon: Rogene Houston, MD;  Location[facility];  Service: Endoscopy;  Laterality: N/A;  GIVENS CAPSULE STUDY N/A 09/22/2016  Procedure: GIVENS CAPSULE STUDY;  Surgeon: Rogene Houston, MD;  Location: [facility];  Service: Endoscopy;  Laterality: N/A;  LAPAROTOMY N/A 08/21/2018  Procedure: EXPLORATORY LAPAROTOMY WITH LYSIS OF ADHESIONS;  Surgeon: Jovita Kussmaul, MD;  Location: [place];  Service: General;  Laterality: N/A;  MASTECTOMY Left 1998  TENDON TRANSFER Right 08/15/2017  Procedure: TENDON TRANSFER RIGHT WRIST EXTENSORS AS NEEDED, ULNA RESECTION AND STABILIZATION;  Surgeon: Charlotte Crumb, MD;  Location: [place];  Service: Orthopedics;  Laterality: Right;  TOTAL KNEE ARTHROPLASTY Right   Dr.Harrison  TUBAL LIGATION   HPI: Patient is a 78 y.o. female with PMH: chronic CHF, CAD with remote MI, HTN, hyperlipidemia, CKD stage III, chronic anemia, GERD, who underwent ex-lap for SBO on 4/30. She was transferred to ICU post-op for severe hypotension and shock, was intubated from 4/30 to 5/3.  Subjective: pleasant, cooperative, hard of hearing Assessment / Plan / Recommendation CHL IP CLINICAL IMPRESSIONS 09/02/2018 Clinical Impression Pt presents with a primary oral dysphagia marked by difficulty manipulating mechanical soft solids, requiring eventual manual removal from oral cavity; purees required lingual pumping with eventual transition into pharynx.  Once material reached valleculae, the pharyngeal swallow was triggered and solids passed readily through UES into esophagus without difficulty.  There was no pharyngeal residue noted throughout study.  Thin liquids were consumed with occasional high, transient penetration  (Penetration-Aspiration Scale score of #2, considered WNL).  There was no aspiration, even when taxing the mechanical system with large, successive and mixed boluses.  Recommend continuing a pureed/dysphagia 1 diet; give meds whole in puree; allow thin liquids; assist with meals/self-feeding as needed.  SLP  Visit Diagnosis Dysphagia, oral phase (R13.11) Attention and concentration deficit following -- Frontal lobe and executive function deficit following -- Impact on safety and function Mild aspiration risk   CHL IP TREATMENT RECOMMENDATION 09/02/2018 Treatment Recommendations Therapy as outlined in treatment plan below   Prognosis 08/26/2018 Prognosis for Safe Diet Advancement Good Barriers to Reach Goals -- Barriers/Prognosis Comment -- [facility] IP DIET RECOMMENDATION 09/02/2018 SLP Diet Recommendations Thin liquid;Dysphagia 1 (Puree) solids Liquid Administration via Cup;Straw Medication Administration Whole meds with puree Compensations -- Postural Changes --   CHL IP OTHER RECOMMENDATIONS 09/02/2018 Recommended Consults -- Oral Care Recommendations Oral care BID Other Recommendations --   CH[facility] IP FOLLOW UP RECOMMENDATIONS 09/02/2018 Follow up Recommendations Skilled Nursing facility   [facility] IP FREQUENCY AND DURATION 09/02/2018 Speech Therapy Frequency (ACUTE ONLY) min 1 x/week Treatment Duration --      CHL IP ORAL PHASE 09/02/2018 Oral Phase Impaired Oral - Pudding Teaspoon -- Oral - Pudding Cup -- Oral - Honey Teaspoon -- Oral - Honey Cup -- Oral - Nectar Teaspoon -- Oral - Nectar Cup -- Oral - Nectar Straw -- Oral - Thin Teaspoon -- Oral - Thin Cup -- Oral - Thin Straw WFL Oral - Puree Weak lingual manipulation;Lingual pumping;Reduced posterior propulsion;Delayed oral transit Oral - Mech Soft Weak lingual manipulation;Lingual pumping;Reduced posterior propulsion;Delayed oral transit Oral - Regular -- Oral - Multi-Consistency -- Oral - Pill -- Oral Phase - Comment --  CHL IP PHARYNGEAL PHASE 09/02/2018 Pharyngeal Phase WFL  Pharyngeal- Pudding Teaspoon -- Pharyngeal -- Pharyngeal- Pudding Cup -- Pharyngeal -- Pharyngeal- Honey Teaspoon -- Pharyngeal -- Pharyngeal- Honey Cup -- Pharyngeal -- Pharyngeal- Nectar Teaspoon -- Pharyngeal -- Pharyngeal- Nectar Cup -- Pharyngeal -- Pharyngeal- Nectar Straw -- Pharyngeal -- Pharyngeal- Thin Teaspoon -- Pharyngeal -- Pharyngeal- Thin Cup -- Pharyngeal -- Pharyngeal- Thin Straw -- Pharyngeal -- Pharyngeal- Puree -- Pharyngeal -- Pharyngeal- Mechanical Soft -- Pharyngeal -- Pharyngeal- Regular -- Pharyngeal -- Pharyngeal- Multi-consistency -- Pharyngeal -- Pharyngeal- Pill -- Pharyngeal -- Pharyngeal Comment --  No flowsheet data found. [name] 09/02/2018, 3:56 PM                Scheduled Meds:  amiodarone  200 mg Oral Daily   carvedilol  3.125 mg Oral BID WC   chlorhexidine  15 mL Mouth Rinse BID   furosemide  40 mg Intravenous Q12H   insulin aspart  0-15 Units Subcutaneous Q6H   ipratropium-albuterol  3 mL Nebulization Q6H   mouth rinse  15 mL Mouth Rinse q12n4p   methylPREDNISolone (SOLU-MEDROL) injection  40 mg Intravenous Q12H   mupirocin ointment   Nasal BID   pantoprazole (PROTONIX) IV  40 mg Intravenous QHS   saccharomyces boulardii  250 mg Oral BID   Continuous Infusions:  TPN ADULT (ION) 60 mL/hr at 09/02/18 1708     LOS: 21 days   Time Spent in minutes   30 minutes  Baraa Tubbs D.O. on 09/03/2018 at 10:42 AM  Between 7am to 7pm - Please see pager noted on [email or web address]  After 7pm go to [email or web address]  And look for the night coverage person covering for me after hours  [facility] Office  [phone]

## 2018-09-03 NOTE — Progress Notes (Signed)
Abdominal dressing changed per MD order without difficulty.  Will continue to monitor.

## 2018-09-04 LAB — CBC
HCT: 14.4 % — ABNORMAL LOW (ref 36.0–46.0)
HCT: 22.6 % — ABNORMAL LOW (ref 36.0–46.0)
Hemoglobin: 4.5 g/dL — CL (ref 12.0–15.0)
Hemoglobin: 7.1 g/dL — ABNORMAL LOW (ref 12.0–15.0)
MCH: 26.9 pg (ref 26.0–34.0)
MCH: 26.9 pg (ref 26.0–34.0)
MCHC: 31.3 g/dL (ref 30.0–36.0)
MCHC: 31.4 g/dL (ref 30.0–36.0)
MCV: 86.2 fL (ref 80.0–100.0)
MCV: 85.6 fL (ref 80.0–100.0)
Platelets: 304 10*3/uL (ref 150–400)
Platelets: 293 10*3/uL (ref 150–400)
RBC: 1.67 MIL/uL — ABNORMAL LOW (ref 3.87–5.11)
RBC: 2.64 MIL/uL — ABNORMAL LOW (ref 3.87–5.11)
RDW: 21.2 % — ABNORMAL HIGH (ref 11.5–15.5)
RDW: 21.2 % — ABNORMAL HIGH (ref 11.5–15.5)
WBC: 11.8 10*3/uL — ABNORMAL HIGH (ref 4.0–10.5)
WBC: 9.4 10*3/uL (ref 4.0–10.5)
nRBC: 0.3 % — ABNORMAL HIGH (ref 0.0–0.2)
nRBC: 0.5 % — ABNORMAL HIGH (ref 0.0–0.2)

## 2018-09-04 LAB — GLUCOSE, CAPILLARY
Glucose-Capillary: 170 mg/dL — ABNORMAL HIGH (ref 70–99)
Glucose-Capillary: 189 mg/dL — ABNORMAL HIGH (ref 70–99)
Glucose-Capillary: 203 mg/dL — ABNORMAL HIGH (ref 70–99)
Glucose-Capillary: 252 mg/dL — ABNORMAL HIGH (ref 70–99)
Glucose-Capillary: 276 mg/dL — ABNORMAL HIGH (ref 70–99)

## 2018-09-04 LAB — COMPREHENSIVE METABOLIC PANEL
ALT: 46 U/L — ABNORMAL HIGH (ref 0–44)
AST: 60 U/L — ABNORMAL HIGH (ref 15–41)
Albumin: 1.5 g/dL — ABNORMAL LOW (ref 3.5–5.0)
Alkaline Phosphatase: 281 U/L — ABNORMAL HIGH (ref 38–126)
Anion gap: 16 — ABNORMAL HIGH (ref 5–15)
BUN: 153 mg/dL — ABNORMAL HIGH (ref 8–23)
CO2: 27 mmol/L (ref 22–32)
Calcium: 8.3 mg/dL — ABNORMAL LOW (ref 8.9–10.3)
Chloride: 101 mmol/L (ref 98–111)
Creatinine, Ser: 2.51 mg/dL — ABNORMAL HIGH (ref 0.44–1.00)
GFR calc Af Amer: 20 mL/min — ABNORMAL LOW (ref >60)
GFR calc non Af Amer: 18 mL/min — ABNORMAL LOW (ref >60)
Glucose, Bld: 189 mg/dL — ABNORMAL HIGH (ref 70–99)
Potassium: 3.5 mmol/L (ref 3.5–5.1)
Sodium: 144 mmol/L (ref 135–145)
Total Bilirubin: 2.5 mg/dL — ABNORMAL HIGH (ref 0.3–1.2)
Total Protein: 5.4 g/dL — ABNORMAL LOW (ref 6.5–8.1)

## 2018-09-04 LAB — MAGNESIUM: Magnesium: 1.9 mg/dL (ref 1.7–2.4)

## 2018-09-04 LAB — PHOSPHORUS: Phosphorus: 5 mg/dL — ABNORMAL HIGH (ref 2.5–4.6)

## 2018-09-04 MED ORDER — STERILE WATER FOR INJECTION IV SOLN
INTRAVENOUS | Status: AC
  Administered 2018-09-04: 18:00:00 via INTRAVENOUS
  Filled 2018-09-04: qty 499.2

## 2018-09-04 MED ORDER — SODIUM CHLORIDE 0.9% IV SOLUTION: Freq: Once | INTRAVENOUS | Status: DC

## 2018-09-04 NOTE — Progress Notes (Signed)
CRITICAL VALUE ALERT  Critical Value:  hgb 4.5  Date & Time Notied:  09/04/2018  05:45  Provider Notified: yes  Orders Received/Actions taken: pending

## 2018-09-04 NOTE — Progress Notes (Signed)
Repeat CBC demonstrated Hgb 7.1.  MD paged.  Will continue to monitor.

## 2018-09-04 NOTE — Care Management Important Message (Signed)
Important Message  Patient Details  Name: [name] MRN: [id number] Date of Birth: 1940-12-20   Medicare Important Message Given:  Yes    Orbie Pyo 09/04/2018, 12:32 PM

## 2018-09-04 NOTE — Progress Notes (Signed)
PROGRESS NOTE    [name]  TKZ:[id number] DOB: [birth year] DOA: 08/13/2018 PCP: [name], MD   Brief Narrative:  HPI on 08/13/2018 by Dr. Cherylann Parr is a 78 y.o. female with medical history significant of anemia chronic disease, ASCVD, AICD placement, history of breast cancer, chronic bronchitis, stage III chronic renal disease, systolic heart failure, hyperlipidemia, GERD, history of GI bleed, gout, hypertension, rheumatoid arthritis, impaired hearing, history of UTI who is coming to the emergency department with complaints of abdominal pain apparently for the past 3 days associated with decreased appetite, several episodes of diarrhea, nausea, but no emesis.  She denies fever, cough, sore throat, chest pain, dysuria, frequency, hematuria, melena or hematochezia.  She denies polyuria, polydipsia, polyphagia or blurred vision.  History is limited by the patient severe hearing impairment.  Interim history Patient was admitted with small bowel obstruction, failed conservative management and was transferred to [name].  General surgery consulted and appreciated, patient underwent ex lap with lysis of adhesions, bowel resection for ischemic bowel.  She continued on the ventilator was put in ICU as she was also hypotensive and started on pressors.  Patient was also shocked for VT.  She developed progressive renal failure, oliguria.  She was weaned off of pressors and extubated. And TRH assumed care. Started on antibiotics for possible pneumonia.  Patient now with prolonged postop ileus, ongoing TNA, diet gradually being advanced per speech therapy.  She was also noted to have a self-limiting GI bleed.  Patient had acute respiratory distress on 09/01/2018 with decompensated CHF and upper airway wheezing.  PCCM consulted.  Assessment & Plan   Acute respiratory failure with hypoxia -Noted on 09/01/2018.  Suspected to be due to decompensated CHF and a  component of bronchospasm. -PCCM consulted, patient was briefly placed on BiPAP for 3 to 4 hours, given IV Lasix and Solu-Medrol -Has now improved, continuing IV Lasix.  Tapering steroids at this time.  Continuing Bronchodilators and BiPAP as needed  Septic shock -Resolved and stable -Status post VDRF and pressure support.  Patient extubated on 08/24/2018.  Weaned off pressors on 08/23/2018  Small bowel obstruction, ischemic colon /Postop ileus -Patient failed conservative management of bowel obstruction at [facility] -General surgery consulted and appreciated -Status post expiratory laparotomy for SBO and small bowel section of ischemic necrotic bowel -Completed 10 days of IV Zosyn -Currently on TNA and awaiting for bowel function to return -NG tube was discontinued -Wound VAC removed on 08/27/2018.  Continue wet to dry dressing changes -Currently on dysphagia 1 diet and tolerating -Noted to have diarrhea on 08/28/2018, Flexi-Seal was placed.  Low index of suspicion for C. difficile in the absence of abdominal pain or leukocytosis.  Diarrhea has now resolved and Flexi-Seal discontinued -patient noted to have 2 bowel movements yesterday 09/03/2018  Diarrhea -As above, Has now resolved. -Suspect related to resolving postoperative ileus  Acute lower GI bleed -As per surgery, felt to be due to staple line -Heparin was discontinued, currently on SCDs for DVT prophylaxis -GI bleed has resolved -Hemoglobin was 4.5 this morning, however rechecked, and currently 7.1 -Continue to monitor CBC  Acute kidney injury on chronic kidney disease, stage III -Baseline creatinine approximately 1.5-1.8, peaked to 3.2 in the setting of septic shock, ATN and ischemic bowel -Creatinine currently 2.51- stable in the 2.5 range, possibly new baseline -Continue to monitor BMP  Hypophosphatemia/hypomagnesemia -Replaced, will continue to monitor and replace as needed  Non-anion gap metabolic acidosis -Due  to diarrhea and  mild hyperchloremia -Resolved with sodium bicarbonate  Acute on chronic systolic CHF -Echocardiogram 08/19/2018 shows an EF of 30%.  Inferior basal and inferoseptal akinesis.  Systolic function indeterminate. -Was off of diuretics, ACE inhibitor in the setting of acute kidney injury and septic shock -Patient developed progressive dyspnea and wheezing on 08/31/2018.  Thought to be due to fluids as well as TNA -Patient was given IV Lasix on 09/01/2018 with good urine output -Continue to monitor intake and output, daily weights  Acute on chronic anemia -Patient did receive 1 unit PRBC this hospitalization -Anemia panel suggestive of anemia of chronic disease -Hemoglobin currently 7.1 -Continue to monitor CBC  Paroxysmal atrial fibrillation/transient NSVT -Status post DC cardioversion 08/20/2018, currently in sinus rhythm -No anticoagulation given GI bleeding issues -Continue amiodarone, Coreg -Need to follow-up with cardiology, Dr. [name]  Severe protein calorie malnutrition -Currently on TNA and dysphagia 1 diet  CAD -Complaints of chest pain -Continue Coreg  Thrombocytopenia -recovered and resolved  Hyperglycemia/ prediabetes -Suspect secondary to steroids and tapering. -Continue insulin sliding scale and CBG monitoring -Hemoglobin A1c 5.8  Physical deconditioning -Likely secondary to acute illness, prolonged hospitalization, underlying medical problems -PT and OT recommending SNF  DVT Prophylaxis  SCDs  Code Status: Full  Family Communication: None at bedside.  Disposition Plan: Admitted. Pending improvement in nutritional status and weaning of TPN.  SNF on discharge  Consultants PCCM General surgery Cardiology  Procedures  Cardioversion 08/20/2018 Ex laparotomy with lysis of adhesion, bowel resection for ischemic bowel  Antibiotics   Anti-infectives (From admission, onward)   Start     Dose/Rate Route Frequency Ordered Stop   08/23/18 1400   piperacillin-tazobactam (ZOSYN) IVPB 2.25 g     2.25 g 100 mL/hr over 30 Minutes Intravenous Every 8 hours 08/23/18 0928 08/30/18 2230   08/21/18 1200  piperacillin-tazobactam (ZOSYN) IVPB 3.375 g  Status:  Discontinued     3.375 g 12.5 mL/hr over 240 Minutes Intravenous Every 8 hours 08/21/18 1102 08/23/18 0928   08/14/18 1000  hydroxychloroquine (PLAQUENIL) tablet 200 mg  Status:  Discontinued    Note to Pharmacy:  Take 1 tablet by mouth daily Monday through Friday only     200 mg Oral Once per day on Mon Tue Wed Thu Fri 08/14/18 0742 08/21/18 1147   08/13/18 2200  doxycycline (VIBRA-TABS) tablet 100 mg  Status:  Discontinued     100 mg Oral Every 12 hours 08/13/18 2134 08/13/18 2135   08/13/18 2200  doxycycline (VIBRAMYCIN) 100 mg in sodium chloride 0.9 % 250 mL IVPB  Status:  Discontinued     100 mg 125 mL/hr over 120 Minutes Intravenous Every 12 hours 08/13/18 2135 08/19/18 1702   08/13/18 2145  cefTRIAXone (ROCEPHIN) 1 g in sodium chloride 0.9 % 100 mL IVPB  Status:  Discontinued     1 g 200 mL/hr over 30 Minutes Intravenous Every 24 hours 08/13/18 2134 08/14/18 0743      Subjective:   Cathlean Cower seen and examined today.  No complaints today. States she had a bowel movement yesterday. Denies current pain, shortness of breath, abdominal pain, nausea vomiting, dizziness or headache.   Objective:   Vitals:   09/03/18 0821 09/03/18 2013 09/03/18 2043 09/04/18 0424  BP: (!) 130/51  (!) 124/95 (!) 123/53  Pulse: 74  68 70  Resp: 16  (!) 21 16  Temp: 98 F (36.7 C)  98.3 F (36.8 C) 98.6 F (37 C)  TempSrc: Oral  Axillary Oral  SpO2: 100% 100% 100% 100%  Weight:      Height:        Intake/Output Summary (Last 24 hours) at 09/04/2018 0807 Last data filed at 09/04/2018 0500 Gross per 24 hour  Intake 200 ml  Output 3850 ml  Net -3650 ml   Filed Weights   08/31/18 1841 09/01/18 0933 09/02/18 0650  Weight: 76.4 kg 76.7 kg 73 kg   Exam  General: Well developed,  chronically ill-appearing, NAD  HEENT: NCAT, mucous membranes moist.   Cardiovascular: S1 S2 auscultated, RRR  Respiratory: Clear to auscultation bilaterally   Abdomen: Soft, nontender, nondistended, + bowel sound, dressing in place  Extremities: warm dry without cyanosis clubbing. +LE edema  Neuro: AAOx3, very hard of hearing otherwise nonfocal  Psych: Appropriate mood and affect  Data Reviewed: I have personally reviewed following labs and imaging studies  CBC: Recent Labs  Lab 09/01/18 0628 09/02/18 0814 09/03/18 0440 09/03/18 0701 09/04/18 0511 09/04/18 0640  WBC 11.2* 9.0 10.1  --  11.8* 9.4  NEUTROABS 8.6*  --   --   --   --   --   HGB 7.3* 7.3* 7.0* 7.0* 4.5* 7.1*  HCT 22.1* 22.4* 21.7* 21.8* 14.4* 22.6*  MCV 81.9 82.7 84.8  --  86.2 85.6  PLT 162 193 211  --  304 938   Basic Metabolic Panel: Recent Labs  Lab 08/28/18 1500  08/31/18 0524 09/01/18 0628 09/02/18 0500 09/03/18 0440 09/04/18 0511  NA 139   < > 142 143 140 142 144  K 3.6   < > 4.1 4.3 4.0 3.7 3.5  CL 115*   < > 110 108 103 105 101  CO2 14*   < > 20* 23 21* 25 27  GLUCOSE 151*   < > 121* 117* 218* 213* 189*  BUN 68*   < > 81* 95* 117* 138* 153*  CREATININE 2.51*   < > 2.34* 2.52* 2.52* 2.53* 2.51*  CALCIUM 7.7*   < > 8.1* 8.4* 8.7* 8.7* 8.3*  MG 2.0  --  1.8 1.8  --   --  1.9  PHOS 2.5  --  3.3 3.8  --  4.8* 5.0*   < > = values in this interval not displayed.   GFR: Estimated Creatinine Clearance: 17.8 mL/min (A) (by C-G formula based on SCr of 2.51 mg/dL (H)). Liver Function Tests: Recent Labs  Lab 08/29/18 0240 09/01/18 0628 09/03/18 0440 09/04/18 0511  AST 40 47* 52* 60*  ALT 19 24 34 46*  ALKPHOS 204* 312* 323* 281*  BILITOT 2.3* 3.1* 2.7* 2.5*  PROT 4.7* 5.3* 5.5* 5.4*  ALBUMIN 1.1* 1.2* 1.4* 1.5*   No results for input(s): LIPASE, AMYLASE in the last 168 hours. No results for input(s): AMMONIA in the last 168 hours. Coagulation Profile: No results for input(s): INR,  PROTIME in the last 168 hours. Cardiac Enzymes: No results for input(s): CKTOTAL, CKMB, CKMBINDEX, TROPONINI in the last 168 hours. BNP (last 3 results) No results for input(s): PROBNP in the last 8760 hours. HbA1C: Recent Labs    09/03/18 0440  HGBA1C 5.8*   CBG: Recent Labs  Lab 09/03/18 1214 09/03/18 1629 09/03/18 2045 09/04/18 0031 09/04/18 0422  GLUCAP 225* 257* 229* 189* 170*   Lipid Profile: No results for input(s): CHOL, HDL, LDLCALC, TRIG, CHOLHDL, LDLDIRECT in the last 72 hours. Thyroid Function Tests: No results for input(s): TSH, T4TOTAL, FREET4, T3FREE, THYROIDAB in the last 72 hours. Anemia Panel: No results for input(s):  VITAMINB12, FOLATE, FERRITIN, TIBC, IRON, RETICCTPCT in the last 72 hours. Urine analysis:    Component Value Date/Time   COLORURINE AMBER (A) 08/13/2018 1849   APPEARANCEUR CLOUDY (A) 08/13/2018 1849   LABSPEC 1.016 08/13/2018 1849   PHURINE 5.0 08/13/2018 1849   GLUCOSEU NEGATIVE 08/13/2018 1849   HGBUR MODERATE (A) 08/13/2018 1849   BILIRUBINUR NEGATIVE 08/13/2018 1849   KETONESUR NEGATIVE 08/13/2018 1849   PROTEINUR 30 (A) 08/13/2018 1849   UROBILINOGEN 0.2 07/17/2013 2240   NITRITE NEGATIVE 08/13/2018 1849   LEUKOCYTESUR TRACE (A) 08/13/2018 1849   Sepsis Labs: @LABRCNTIP (procalcitonin:4,lacticidven:4)  ) Recent Results (from the past 240 hour(s))  Novel Coronavirus, NAA (hospital order; send-out to ref lab)     Status: None   Collection Time: 09/01/18 10:10 AM  Result Value Ref Range Status   SARS-CoV-2, NAA NOT DETECTED NOT DETECTED Final    Comment: (NOTE) This test was developed and its performance characteristics determined by [employer]. This test has not been FDA cleared or approved. This test has been authorized by FDA under an Emergency Use Authorization (EUA). This test is only authorized for the duration of time the declaration that circumstances exist justifying the authorization of the emergency use  of in vitro diagnostic tests for detection of SARS-CoV-2 virus and/or diagnosis of COVID-19 infection under section 564(b)(1) of the Act, 21 U.S.C. 382NKN-3(Z)(7), unless the authorization is terminated or revoked sooner. When diagnostic testing is negative, the possibility of a false negative result should be considered in the context of a patient's recent exposures and the presence of clinical signs and symptoms consistent with COVID-19. An individual without symptoms of COVID-19 and who is not shedding SARS-CoV-2 virus would expect to have a negative (not detected) result in this assay. Performed  At: [facility] [address] [place], [place] [id number] Rush Farmer MD KW:[id number]    [place]  Final    Comment: Performed at [facility], [place] [address], [place], [place] [address]      Radiology Studies: Dg Swallowing Func-speech Pathology  Result Date: 09/02/2018 Objective Swallowing Evaluation: Type of Study: MBS-Modified Barium Swallow Study  Patient Details Name: KEYMONI MCCASTER MRN: [id number] Date of Birth: 09-11-1940 Today's Date: 09/02/2018 Time: SLP Start Time (ACUTE ONLY): 1315 -SLP Stop Time (ACUTE ONLY): 1340 SLP Time Calculation (min) (ACUTE ONLY): 25 min Past Medical History: Past Medical History: Diagnosis Date  Anemia   Hgb of 9-10  Anemia of chronic disease   Hgb of 9-10 chronically; 06/2010: H&H-10.7/33.5, MCV-81, normal iron studies in 2010   Arteriosclerotic cardiovascular disease (ASCVD)   Remote PTCA by patient report; LBBB; associated cardiomyopathy, presumed ischemic with EF 40-45% previously, 20% in 06/2009; h/o clinical congestive heart failure; negative stress nuclear in 2009 with inferoseptal and apical scar  Automatic implantable cardioverter-defibrillator in situ   Breast cancer (HCC)   breast  Chronic bronchitis (HCC)   Chronic renal disease, stage 3, moderately decreased glomerular filtration rate (GFR) between  30-59 mL/min/1.73 square meter ([place]) 02/01/2016  Congestive heart failure (CHF) (HCC)   Elevated cholesterol   Elevated sed rate   GERD (gastroesophageal reflux disease)   GI bleed   Gout   HOH (hard of hearing)   Hyperlipidemia   Hypertension   Rheumatoid arthritis ([place])   UTI (urinary tract infection)  Past Surgical History: Past Surgical History: Procedure Laterality Date  BI-VENTRICULAR IMPLANTABLE CARDIOVERTER DEFIBRILLATOR N/A 07/28/2012  Procedure: BI-VENTRICULAR IMPLANTABLE CARDIOVERTER DEFIBRILLATOR  (CRT-D);  Surgeon: Evans Lance, MD;  Location: [place] CATH LAB;  Service: Cardiovascular;  Laterality: N/A;  BI-VENTRICULAR IMPLANTABLE CARDIOVERTER DEFIBRILLATOR  (CRT-D)  07/28/2012  BOWEL RESECTION N/A 08/21/2018  Procedure: Small Bowel Resection;  Surgeon: Jovita Kussmaul, MD;  Location: [place];  Service: General;  Laterality: N/A;  BREAST BIOPSY Bilateral   CATARACT EXTRACTION W/ INTRAOCULAR LENS IMPLANT Left   CATARACT EXTRACTION W/PHACO Right 09/15/2013  Procedure: CATARACT EXTRACTION PHACO AND INTRAOCULAR LENS PLACEMENT ([place]);  Surgeon: Elta Guadeloupe T. Gershon Crane, MD;  Location: [facility];  Service: Ophthalmology;  Laterality: Right;  CDE:  10.74  COLONOSCOPY N/A 03/04/2015  Procedure: COLONOSCOPY;  Surgeon: Rogene Houston, MD;  [facility];  Service: Endoscopy;  Laterality: N/A;  10:50   ESOPHAGOGASTRODUODENOSCOPY N/A 03/04/2015  Procedure: ESOPHAGOGASTRODUODENOSCOPY (EGD);  Surgeon: Rogene Houston, MD;  [facility];  Service: Endoscopy;  Laterality: N/A;  ESOPHAGOGASTRODUODENOSCOPY N/A 09/21/2016  Procedure: ESOPHAGOGASTRODUODENOSCOPY (EGD);  Surgeon: Rogene Houston, MD;  [facility];  Service: Endoscopy;  Laterality: N/A;  GIVENS CAPSULE STUDY N/A 09/22/2016  Procedure: GIVENS CAPSULE STUDY;  Surgeon: Rogene Houston, MD;  [facility];  Service: Endoscopy;  Laterality: N/A;  LAPAROTOMY N/A 08/21/2018  Procedure: EXPLORATORY LAPAROTOMY WITH LYSIS OF  ADHESIONS;  Surgeon: Jovita Kussmaul, MD;  Location: [place];  Service: General;  Laterality: N/A;  MASTECTOMY Left 1998  TENDON TRANSFER Right 08/15/2017  Procedure: TENDON TRANSFER RIGHT WRIST EXTENSORS AS NEEDED, ULNA RESECTION AND STABILIZATION;  Surgeon: Charlotte Crumb, MD;  Location: [place];  Service: Orthopedics;  Laterality: Right;  TOTAL KNEE ARTHROPLASTY Right   Dr.Harrison  TUBAL LIGATION   HPI: Patient is a 78 y.o. female with PMH: chronic CHF, CAD with remote MI, HTN, hyperlipidemia, CKD stage III, chronic anemia, GERD, who underwent ex-lap for SBO on 4/30. She was transferred to ICU post-op for severe hypotension and shock, was intubated from 4/30 to 5/3.  Subjective: pleasant, cooperative, hard of hearing Assessment / Plan / Recommendation CHL IP CLINICAL IMPRESSIONS 09/02/2018 Clinical Impression Pt presents with a primary oral dysphagia marked by difficulty manipulating mechanical soft solids, requiring eventual manual removal from oral cavity; purees required lingual pumping with eventual transition into pharynx.  Once material reached valleculae, the pharyngeal swallow was triggered and solids passed readily through UES into esophagus without difficulty.  There was no pharyngeal residue noted throughout study.  Thin liquids were consumed with occasional high, transient penetration (Penetration-Aspiration Scale score of #2, considered WNL).  There was no aspiration, even when taxing the mechanical system with large, successive and mixed boluses.  Recommend continuing a pureed/dysphagia 1 diet; give meds whole in puree; allow thin liquids; assist with meals/self-feeding as needed.  SLP Visit Diagnosis Dysphagia, oral phase (R13.11) Attention and concentration deficit following -- Frontal lobe and executive function deficit following -- Impact on safety and function Mild aspiration risk   CHL IP TREATMENT RECOMMENDATION 09/02/2018 Treatment Recommendations Therapy as outlined in treatment plan below    Prognosis 08/26/2018 Prognosis for Safe Diet Advancement Good Barriers to Reach Goals -- Barriers/Prognosis Comment -- CHL IP DIET RECOMMENDATION 09/02/2018 SLP Diet Recommendations Thin liquid;Dysphagia 1 (Puree) solids Liquid Administration via Cup;Straw Medication Administration Whole meds with puree Compensations -- Postural Changes --   CHL IP OTHER RECOMMENDATIONS 09/02/2018 Recommended Consults -- Oral Care Recommendations Oral care BID Other Recommendations --   CHL IP FOLLOW UP RECOMMENDATIONS 09/02/2018 Follow up Recommendations Skilled Nursing facility   [facility] IP FREQUENCY AND DURATION 09/02/2018 Speech Therapy Frequency (ACUTE ONLY) min 1 x/week  Treatment Duration --      CHL IP ORAL PHASE 09/02/2018 Oral Phase Impaired Oral - Pudding Teaspoon -- Oral - Pudding Cup -- Oral - Honey Teaspoon -- Oral - Honey Cup -- Oral - Nectar Teaspoon -- Oral - Nectar Cup -- Oral - Nectar Straw -- Oral - Thin Teaspoon -- Oral - Thin Cup -- Oral - Thin Straw WFL Oral - Puree Weak lingual manipulation;Lingual pumping;Reduced posterior propulsion;Delayed oral transit Oral - Mech Soft Weak lingual manipulation;Lingual pumping;Reduced posterior propulsion;Delayed oral transit Oral - Regular -- Oral - Multi-Consistency -- Oral - Pill -- Oral Phase - Comment --  CHL IP PHARYNGEAL PHASE 09/02/2018 Pharyngeal Phase WFL Pharyngeal- Pudding Teaspoon -- Pharyngeal -- Pharyngeal- Pudding Cup -- Pharyngeal -- Pharyngeal- Honey Teaspoon -- Pharyngeal -- Pharyngeal- Honey Cup -- Pharyngeal -- Pharyngeal- Nectar Teaspoon -- Pharyngeal -- Pharyngeal- Nectar Cup -- Pharyngeal -- Pharyngeal- Nectar Straw -- Pharyngeal -- Pharyngeal- Thin Teaspoon -- Pharyngeal -- Pharyngeal- Thin Cup -- Pharyngeal -- Pharyngeal- Thin Straw -- Pharyngeal -- Pharyngeal- Puree -- Pharyngeal -- Pharyngeal- Mechanical Soft -- Pharyngeal -- Pharyngeal- Regular -- Pharyngeal -- Pharyngeal- Multi-consistency -- Pharyngeal -- Pharyngeal- Pill -- Pharyngeal -- Pharyngeal  Comment --  No flowsheet data found. [name] 09/02/2018, 3:56 PM                Scheduled Meds:  sodium chloride   Intravenous Once   amiodarone  200 mg Oral Daily   carvedilol  3.125 mg Oral BID WC   chlorhexidine  15 mL Mouth Rinse BID   furosemide  40 mg Intravenous Q12H   insulin aspart  0-20 Units Subcutaneous Q6H   ipratropium-albuterol  3 mL Nebulization Q6H   mouth rinse  15 mL Mouth Rinse q12n4p   methylPREDNISolone (SOLU-MEDROL) injection  40 mg Intravenous Q12H   mupirocin ointment   Nasal BID   pantoprazole (PROTONIX) IV  40 mg Intravenous QHS   saccharomyces boulardii  250 mg Oral BID   Continuous Infusions:  TPN ADULT (ION) 60 mL/hr at 09/03/18 1814     LOS: 22 days   Time Spent in minutes   30 minutes  Josiel Gahm D.O. on 09/04/2018 at 8:07 AM  Between 7am to 7pm - Please see pager noted on [email or web address]  After 7pm go to [email or web address]  And look for the night coverage person covering for me after hours  [facility] Office  [phone]

## 2018-09-04 NOTE — Progress Notes (Signed)
Abdominal wet to dry dressing change completed per MD order, patient tolerated procedure well with no pain complaint. Will continue to monitor.

## 2018-09-04 NOTE — Progress Notes (Signed)
Patient resting comfortably on nasal cannula this AM with no respiratory distress noted.  Bipap currently not indicated at this time.  Ordered for PRN.  Will continue to monitor and assess for bipap needs.

## 2018-09-04 NOTE — Progress Notes (Signed)
Abdominal dressing changed per MD order without difficulty.  Will continue to monitor.

## 2018-09-04 NOTE — Progress Notes (Signed)
Physical Therapy Treatment Patient Details Name: [name] MRN: [id number] DOB: February 18, 1941 Today's Date: 09/04/2018    History of Present Illness 78 year old female with significant cardiac history that is post exploratory laparotomy for small bowel obstruction with associated ischemic bowel on 4/30, return to the intensive care on mechanical ventilator, critical care asked to assist with care.  VDRF 4/30-5/3.  Also PNA.  BMW:[id number] heart failure with EF 30%, nonsustained VT, pericardial effusion CAD w/ prior AICD, diastolic heart failure, CKD stage III, hypertension, rheumatoid arthritis, anemia of chronic disease.    PT Comments    Pt is always agreeable to participating.  Thus far we have used toileting needs for therapy.  Emphasis on warm up, transition to EOB, sitting balance, sit to stand and transfer to [facility] and to recliner.    Follow Up Recommendations  SNF;Supervision/Assistance - 24 hour     Equipment Recommendations  Other (comment)(TBA)    Recommendations for Other Services       Precautions / Restrictions Precautions Precautions: Fall    Mobility  Bed Mobility Overal bed mobility: Needs Assistance Bed Mobility: Supine to Sit     Supine to sit: Mod assist     General bed mobility comments: starts to move legs to EOB with cues, assist to bridge and assist to come up and forward.  Transfers Overall transfer level: Needs assistance Equipment used: None Transfers: Sit to/from [facility] Sit to Stand: Mod assist;+2 physical assistance;+2 safety/equipment Stand pivot transfers: Mod assist;+2 physical assistance       General transfer comment: pivot to [facility] and BSC to recliner.  pt took approximately 10 steps to pivot over to the chair with face to face assist  Ambulation/Gait             General Gait Details: pivotal steps with face to face assist   Stairs             Wheelchair Mobility    Modified Rankin (Stroke  Patients Only)       Balance   Sitting-balance support: No upper extremity supported;Bilateral upper extremity supported Sitting balance-Leahy Scale: Fair Sitting balance - Comments: able to sit EOB with min guard assist      Standing balance-Leahy Scale: Poor Standing balance comment: Pt reliant on bil. UE support                             Cognition Arousal/Alertness: Awake/alert Behavior During Therapy: WFL for tasks assessed/performed Overall Cognitive Status: Difficult to assess(NT formally)                               Problem Solving: Slow processing;Decreased initiation;Difficulty sequencing;Requires verbal cues;Requires tactile cues        Exercises General Exercises - Lower Extremity Heel Slides: AROM;Both;10 reps;Supine(graded resistance )    General Comments General comments (skin integrity, edema, etc.): vss throughout      Pertinent Vitals/Pain Pain Assessment: Faces Faces Pain Scale: No hurt    Home Living                      Prior Function            PT Goals (current goals can now be found in the care plan section) Acute Rehab PT Goals Patient Stated Goal: get stronger and safe to go home PT Goal Formulation: With patient Time For Goal  Achievement: 09/08/18 Potential to Achieve Goals: Good Progress towards PT goals: Progressing toward goals    Frequency    Min 2X/week      PT Plan Current plan remains appropriate    Co-evaluation              AM-PAC PT "6 Clicks" Mobility   Outcome Measure  Help needed turning from your back to your side while in a flat bed without using bedrails?: A Lot Help needed moving from lying on your back to sitting on the side of a flat bed without using bedrails?: A Lot Help needed moving to and from a bed to a chair (including a wheelchair)?: A Lot Help needed standing up from a chair using your arms (e.g., wheelchair or bedside chair)?: A Lot Help needed to  walk in hospital room?: A Lot Help needed climbing 3-5 steps with a railing? : A Lot 6 Click Score: 12    End of Session   Activity Tolerance: Patient limited by fatigue Patient left: in chair;with call bell/phone within reach;with chair alarm set Nurse Communication: Mobility status PT Visit Diagnosis: Unsteadiness on feet (R26.81);Muscle weakness (generalized) (M62.81)     Time: 6815-9470 PT Time Calculation (min) (ACUTE ONLY): 32 min  Charges:  $Therapeutic Activity: 23-37 mins                     09/04/2018  [name], PT Acute Rehabilitation Services [phone]  (pager) [phone]  (office)   [name] 09/04/2018, 2:46 PM

## 2018-09-04 NOTE — Progress Notes (Signed)
PHARMACY - ADULT TOTAL PARENTERAL NUTRITION CONSULT NOTE   Pharmacy Consult for TPN Indication: s/p ex lap with SBR for SBO and ischemic/necroticbowel  Patient Measurements: Height: '5\' 4"'  (162.6 cm) Weight: 160 lb 15 oz (73 kg) IBW/kg (Calculated) : 54.7 TPN AdjBW (KG): 64.8 Body mass index is 27.62 kg/m.  Assessment:  78 year old female with PMH significant for anemia, CAD s/p AICD placement, breast cancer, CKD III, CHF, HLD, impaired hearing, and RA. She presented to [facility] on 4/23 with CAP and SBO. CT on 4/28 show persistent SBO and patient was transferred to [facility] for surgery. On 4/30, an ex lap, lysis of adhesions and small bowel resection was performed. As of 5/4, Patient has been ~12 days without nutrition and pharmacy was consulted 5/4 for TPN with plan for continued bowel rest and NG until bowel function returns.  GI: NGT removed 5/5, JP drain 1900 mls. PPI IV. Pre-albumin <5 >>8.6. No N/V. +Flatus. LBM 5/13 (flexiseal in place, loose stool). Dyphagia 1 diet initiated 5/9. Endo: No history of diabetes. CBGs 170-257 with steroids. Insulin requirements in the past 24 hours: 27 units of moderate SSI. Lytes: K 3.5, Phos 5, Mg 1.9; Corr Ca 10.3 (alb 1.5), CO2 27, s/p Bicarb drip (maximizing acetate in TPN) and bicarb tablet added. Renal: Scr 2.51 (unchanged), BUN up 153 in setting of IV Lasix and AKI, off CRRT - no plan for dialysis. UOP 1.1 mL/kg/hr.   Pulm: Extubated on 5/3. Required BiPAP yesterday, now back on 2L [place]. Steroids + Lasix. Cards: Amiodarone po (s/p VT arrest earlier in admission); V-paced at 70, BP ok. Hepatobil: AST up 60; T Bili down to 2.5; ALT 46. Alk Phos 281. TG 81. Neuro: oriented to person only, complains of abdominal pain ID: WBC up slightly to 11.8, afebrile off Zosyn.   TPN Access: CVC triple lumen placed 4/30 TPN start date: 5/4 Nutritional Goals (per RD recommendation on 5/11): Kcal: 2000-2200 kcal Protein:  100-120 grams (decreased to 75 gms or 1 gm/kg due  to rising BUN) Fluid:  >/= 2 L/day   Goal TPN rate is 60 ml/hr (provides 75 g of protein, 288 g of dextrose, and 60 g of lipids which provides 2002 kCals per day, meeting 100% of patient needs) -- reduced protein to 1 gm/kg due to rising BUN and concentrated to reduce volume.   Current Nutrition:  Dysphagia 1, puree solids diet + Magic cup - charted 15% and 30% of 2 meals yesterday TPN at goal  Plan:  Continue TPN at 60 mL/hr  This TPN provides  75g of protein, 288g of dextrose, and 60g of lipids which provides 2002 kCals per day, meeting 100% of patient's protein and calorie needs. Electrolytes in TPN: Per discussion with Dr. Algis Liming, ok to add back lytes in TPN cautiously- continue current reduced amounts. Remove calcium. Maximize acetate. Add MVI to TPN - trace elements are on back order, will only place in TPN on Monday, Wednesday and Friday *CBGs uncontrolled -possible wean of TPN soon with increased po intake. Feel like this is steroid-induced as CBG's were controlled on TPN at goal prior to start of TPN - would prefer to target outside of TPN. Increase to resistant SSI q6h and adjust as needed.  Monitor TPN labs Follow-up renal function and diuresis. Follow-up toleration of diet/intake amounts and ability to wean TPN Re-evaluate volume status - TPN currently concentrated  Alanda Slim, PharmD, [facility] Clinical Pharmacist Please see AMION for all Pharmacists' Contact Phone Numbers 09/04/2018, 7:26 AM

## 2018-09-05 ENCOUNTER — Other Ambulatory Visit (HOSPITAL_COMMUNITY): Payer: Medicare Other

## 2018-09-05 LAB — BASIC METABOLIC PANEL
Anion gap: 13 (ref 5–15)
BUN: 156 mg/dL — ABNORMAL HIGH (ref 8–23)
CO2: 30 mmol/L (ref 22–32)
Calcium: 8.1 mg/dL — ABNORMAL LOW (ref 8.9–10.3)
Chloride: 103 mmol/L (ref 98–111)
Creatinine, Ser: 2.27 mg/dL — ABNORMAL HIGH (ref 0.44–1.00)
GFR calc Af Amer: 23 mL/min — ABNORMAL LOW (ref >60)
GFR calc non Af Amer: 20 mL/min — ABNORMAL LOW (ref >60)
Glucose, Bld: 173 mg/dL — ABNORMAL HIGH (ref 70–99)
Potassium: 3.1 mmol/L — ABNORMAL LOW (ref 3.5–5.1)
Sodium: 146 mmol/L — ABNORMAL HIGH (ref 135–145)

## 2018-09-05 LAB — GLUCOSE, CAPILLARY
Glucose-Capillary: 205 mg/dL — ABNORMAL HIGH (ref 70–99)
Glucose-Capillary: 227 mg/dL — ABNORMAL HIGH (ref 70–99)
Glucose-Capillary: 232 mg/dL — ABNORMAL HIGH (ref 70–99)
Glucose-Capillary: 270 mg/dL — ABNORMAL HIGH (ref 70–99)
Glucose-Capillary: 337 mg/dL — ABNORMAL HIGH (ref 70–99)

## 2018-09-05 LAB — HEMOGLOBIN AND HEMATOCRIT, BLOOD
HCT: 24.1 % — ABNORMAL LOW (ref 36.0–46.0)
Hemoglobin: 7.5 g/dL — ABNORMAL LOW (ref 12.0–15.0)

## 2018-09-05 MED ORDER — TRACE MINERALS CR-CU-MN-SE-ZN 10-1000-500-60 MCG/ML IV SOLN
INTRAVENOUS | Status: AC
  Administered 2018-09-05: 18:00:00 via INTRAVENOUS
  Filled 2018-09-05: qty 499.2

## 2018-09-05 MED ORDER — IPRATROPIUM-ALBUTEROL 0.5-2.5 (3) MG/3ML IN SOLN
3.0000 mL | Freq: Three times a day (TID) | RESPIRATORY_TRACT | Status: DC
  Administered 2018-09-06: 3 mL via RESPIRATORY_TRACT
  Filled 2018-09-05: qty 3

## 2018-09-05 MED ORDER — POTASSIUM CHLORIDE CRYS ER 20 MEQ PO TBCR
40.0000 meq | EXTENDED_RELEASE_TABLET | Freq: Once | ORAL | Status: AC
  Administered 2018-09-05: 40 meq via ORAL
  Filled 2018-09-05: qty 2

## 2018-09-05 MED ORDER — ENSURE ENLIVE PO LIQD
237.0000 mL | Freq: Two times a day (BID) | ORAL | Status: DC
  Administered 2018-09-05 – 2018-09-15 (×15): 237 mL via ORAL

## 2018-09-05 NOTE — Progress Notes (Signed)
Patient is resting comfortably on room air. All vitals are stable. BIPAP not needed at this time. Will continue to monitor.

## 2018-09-05 NOTE — Progress Notes (Signed)
PROGRESS NOTE    [name]  CNO:[id number] DOB: 08-08-1940 DOA: 08/13/2018 PCP: [name], MD   Brief Narrative:  HPI on 08/13/2018 by Dr. Cherylann Parr is a 78 y.o. female with medical history significant of anemia chronic disease, ASCVD, AICD placement, history of breast cancer, chronic bronchitis, stage III chronic renal disease, systolic heart failure, hyperlipidemia, GERD, history of GI bleed, gout, hypertension, rheumatoid arthritis, impaired hearing, history of UTI who is coming to the emergency department with complaints of abdominal pain apparently for the past 3 days associated with decreased appetite, several episodes of diarrhea, nausea, but no emesis.  She denies fever, cough, sore throat, chest pain, dysuria, frequency, hematuria, melena or hematochezia.  She denies polyuria, polydipsia, polyphagia or blurred vision.  History is limited by the patient severe hearing impairment.  Interim history Patient was admitted with small bowel obstruction, failed conservative management and was transferred to [name].  General surgery consulted and appreciated, patient underwent ex lap with lysis of adhesions, bowel resection for ischemic bowel.  She continued on the ventilator was put in ICU as she was also hypotensive and started on pressors.  Patient was also shocked for VT.  She developed progressive renal failure, oliguria.  She was weaned off of pressors and extubated. And TRH assumed care. Started on antibiotics for possible pneumonia.  Patient now with prolonged postop ileus, ongoing TNA, diet gradually being advanced per speech therapy.  She was also noted to have a self-limiting GI bleed.  Patient had acute respiratory distress on 09/01/2018 with decompensated CHF and upper airway wheezing.  PCCM consulted.  Assessment & Plan   Acute respiratory failure with hypoxia -Noted on 09/01/2018.  Suspected to be due to decompensated CHF and a  component of bronchospasm. -PCCM consulted, patient was briefly placed on BiPAP for 3 to 4 hours, given IV Lasix and Solu-Medrol -Has now improved, continuing IV Lasix.  Tapering steroids at this time.  Continuing Bronchodilators and BiPAP as needed  Septic shock -Resolved and stable -Status post VDRF and pressure support.  Patient extubated on 08/24/2018.  Weaned off pressors on 08/23/2018  Small bowel obstruction, ischemic colon /Postop ileus -Patient failed conservative management of bowel obstruction at [facility] -General surgery consulted and appreciated -Status post expiratory laparotomy for SBO and small bowel section of ischemic necrotic bowel -Completed 10 days of IV Zosyn -NG tube was discontinued -Wound VAC removed on 08/27/2018.  Continue wet to dry dressing changes -Currently on dysphagia 1 diet and tolerating -Noted to have diarrhea on 08/28/2018, Flexi-Seal was placed.  Low index of suspicion for C. difficile in the absence of abdominal pain or leukocytosis.  Diarrhea has now resolved and Flexi-Seal discontinued -patient having daily bowel movements -Continue TNA until patient is able to increase intake  Diarrhea -As above, Has now resolved. -Suspect related to resolving postoperative ileus  Acute lower GI bleed -As per surgery, felt to be due to staple line -Heparin was discontinued, currently on SCDs for DVT prophylaxis -GI bleed has resolved -Hemoglobin was 4.5 this morning, however rechecked, and currently 7.5 -Continue to monitor CBC  Acute kidney injury on chronic kidney disease, stage III -Baseline creatinine approximately 1.5-1.8, peaked to 3.2 in the setting of septic shock, ATN and ischemic bowel -Creatinine currently 2.27- stable in the 2.5 range, possibly new baseline -Continue to monitor BMP  Hypophosphatemia/hypomagnesemia -Replaced, will continue to monitor and replace as needed  Non-anion gap metabolic acidosis -Due to diarrhea and mild hyperchloremia  -  Resolved with sodium bicarbonate  Acute on chronic systolic CHF -Echocardiogram 08/19/2018 shows an EF of 30%.  Inferior basal and inferoseptal akinesis.  Systolic function indeterminate. -Was off of diuretics, ACE inhibitor in the setting of acute kidney injury and septic shock -Patient developed progressive dyspnea and wheezing on 08/31/2018.  Thought to be due to fluids as well as TNA -Patient was given IV Lasix on 09/01/2018 with good urine output -Continue to monitor intake and output, daily weights  Acute on chronic anemia -Patient did receive 1 unit PRBC this hospitalization -Anemia panel suggestive of anemia of chronic disease -Hemoglobin currently 7.5 -Continue to monitor CBC  Paroxysmal atrial fibrillation/transient NSVT -Status post DC cardioversion 08/20/2018, currently in sinus rhythm -No anticoagulation given GI bleeding issues -Continue amiodarone, Coreg -Need to follow-up with cardiology, Dr. [name]  Severe protein calorie malnutrition -Currently on TNA and dysphagia 1 diet -speech therapy following  CAD -Complaints of chest pain -Continue Coreg  Thrombocytopenia -recovered and resolved  Hyperglycemia/ prediabetes -Suspect secondary to steroids and tapering. -Continue insulin sliding scale and CBG monitoring -Hemoglobin A1c 5.8  Physical deconditioning -Likely secondary to acute illness, prolonged hospitalization, underlying medical problems -PT and OT recommending SNF  DVT Prophylaxis  SCDs  Code Status: Full  Family Communication: None at bedside.  Disposition Plan: Admitted. Pending improvement in nutritional status and weaning of TPN.  SNF on discharge  Consultants PCCM General surgery Cardiology  Procedures  Cardioversion 08/20/2018 Ex laparotomy with lysis of adhesion, bowel resection for ischemic bowel  Antibiotics   Anti-infectives (From admission, onward)   Start     Dose/Rate Route Frequency Ordered Stop   08/23/18 1400   piperacillin-tazobactam (ZOSYN) IVPB 2.25 g     2.25 g 100 mL/hr over 30 Minutes Intravenous Every 8 hours 08/23/18 0928 08/30/18 2230   08/21/18 1200  piperacillin-tazobactam (ZOSYN) IVPB 3.375 g  Status:  Discontinued     3.375 g 12.5 mL/hr over 240 Minutes Intravenous Every 8 hours 08/21/18 1102 08/23/18 0928   08/14/18 1000  hydroxychloroquine (PLAQUENIL) tablet 200 mg  Status:  Discontinued    Note to Pharmacy:  Take 1 tablet by mouth daily Monday through Friday only     200 mg Oral Once per day on Mon Tue Wed Thu Fri 08/14/18 0742 08/21/18 1147   08/13/18 2200  doxycycline (VIBRA-TABS) tablet 100 mg  Status:  Discontinued     100 mg Oral Every 12 hours 08/13/18 2134 08/13/18 2135   08/13/18 2200  doxycycline (VIBRAMYCIN) 100 mg in sodium chloride 0.9 % 250 mL IVPB  Status:  Discontinued     100 mg 125 mL/hr over 120 Minutes Intravenous Every 12 hours 08/13/18 2135 08/19/18 1702   08/13/18 2145  cefTRIAXone (ROCEPHIN) 1 g in sodium chloride 0.9 % 100 mL IVPB  Status:  Discontinued     1 g 200 mL/hr over 30 Minutes Intravenous Every 24 hours 08/13/18 2134 08/14/18 0743      Subjective:   [name] seen and examined today.  No complaints today.  Nominal pain is improving.  Denies current chest pain, shortness of breath, nausea or vomiting, dizziness or headache.  Is having bowel movements. Objective:   Vitals:   09/05/18 0032 09/05/18 0325 09/05/18 0809 09/05/18 0827  BP: (!) 116/48 (!) 122/56 (!) 130/50   Pulse: 70 70 73 76  Resp: 15 12 12 17   Temp: 97.9 F (36.6 C) 97.6 F (36.4 C) (!) 97.5 F (36.4 C)   TempSrc: Oral Oral Oral  SpO2: 99% 99% 99% 99%  Weight:  68.8 kg    Height:        Intake/Output Summary (Last 24 hours) at 09/05/2018 0842 Last data filed at 09/05/2018 0600 Gross per 24 hour  Intake 1091.94 ml  Output 1100 ml  Net -8.06 ml   Filed Weights   09/01/18 0933 09/02/18 0650 09/05/18 0325  Weight: 76.7 kg 73 kg 68.8 kg   Exam  General: Well  developed, tonically ill-appearing, NAD  HEENT: NCAT, mucous membranes moist.   Cardiovascular: S1 S2 auscultated, RRR  Respiratory: Clear to auscultation bilaterally  Abdomen: Soft, nontender, nondistended, + bowel sounds, dressing in place  Extremities: warm dry without cyanosis clubbing.   Neuro: AAOx3, hard of hearing otherwise nonfocal  Psych: Appropriate mood and affect, pleasant  Data Reviewed: I have personally reviewed following labs and imaging studies  CBC: Recent Labs  Lab 09/01/18 0628 09/02/18 0814 09/03/18 0440 09/03/18 0701 09/04/18 0511 09/04/18 0640 09/05/18 0325  WBC 11.2* 9.0 10.1  --  11.8* 9.4  --   NEUTROABS 8.6*  --   --   --   --   --   --   HGB 7.3* 7.3* 7.0* 7.0* 4.5* 7.1* 7.5*  HCT 22.1* 22.4* 21.7* 21.8* 14.4* 22.6* 24.1*  MCV 81.9 82.7 84.8  --  86.2 85.6  --   PLT 162 193 211  --  304 293  --    Basic Metabolic Panel: Recent Labs  Lab 08/31/18 0524 09/01/18 0628 09/02/18 0500 09/03/18 0440 09/04/18 0511 09/05/18 0325  NA 142 143 140 142 144 146*  K 4.1 4.3 4.0 3.7 3.5 3.1*  CL 110 108 103 105 101 103  CO2 20* 23 21* 25 27 30   GLUCOSE 121* 117* 218* 213* 189* 173*  BUN 81* 95* 117* 138* 153* 156*  CREATININE 2.34* 2.52* 2.52* 2.53* 2.51* 2.27*  CALCIUM 8.1* 8.4* 8.7* 8.7* 8.3* 8.1*  MG 1.8 1.8  --   --  1.9  --   PHOS 3.3 3.8  --  4.8* 5.0*  --    GFR: Estimated Creatinine Clearance: 19.1 mL/min (A) (by C-G formula based on SCr of 2.27 mg/dL (H)). Liver Function Tests: Recent Labs  Lab 09/01/18 0628 09/03/18 0440 09/04/18 0511  AST 47* 52* 60*  ALT 24 34 46*  ALKPHOS 312* 323* 281*  BILITOT 3.1* 2.7* 2.5*  PROT 5.3* 5.5* 5.4*  ALBUMIN 1.2* 1.4* 1.5*   No results for input(s): LIPASE, AMYLASE in the last 168 hours. No results for input(s): AMMONIA in the last 168 hours. Coagulation Profile: No results for input(s): INR, PROTIME in the last 168 hours. Cardiac Enzymes: No results for input(s): CKTOTAL, CKMB,  CKMBINDEX, TROPONINI in the last 168 hours. BNP (last 3 results) No results for input(s): PROBNP in the last 8760 hours. HbA1C: Recent Labs    09/03/18 0440  HGBA1C 5.8*   CBG: Recent Labs  Lab 09/04/18 0832 09/04/18 1143 09/04/18 1717 09/05/18 0030 09/05/18 0607  GLUCAP 252* 276* 203* 232* 205*   Lipid Profile: No results for input(s): CHOL, HDL, LDLCALC, TRIG, CHOLHDL, LDLDIRECT in the last 72 hours. Thyroid Function Tests: No results for input(s): TSH, T4TOTAL, FREET4, T3FREE, THYROIDAB in the last 72 hours. Anemia Panel: No results for input(s): VITAMINB12, FOLATE, FERRITIN, TIBC, IRON, RETICCTPCT in the last 72 hours. Urine analysis:    Component Value Date/Time   COLORURINE AMBER (A) 08/13/2018 1849   APPEARANCEUR CLOUDY (A) 08/13/2018 1849   LABSPEC 1.016 08/13/2018 1849  PHURINE 5.0 08/13/2018 1849   GLUCOSEU NEGATIVE 08/13/2018 1849   HGBUR MODERATE (A) 08/13/2018 [place] 08/13/2018 [place] 08/13/2018 1849   PROTEINUR 30 (A) 08/13/2018 1849   UROBILINOGEN 0.2 07/17/2013 2240   NITRITE NEGATIVE 08/13/2018 1849   LEUKOCYTESUR TRACE (A) 08/13/2018 1849   Sepsis Labs: @LABRCNTIP (procalcitonin:4,lacticidven:4)  ) Recent Results (from the past 240 hour(s))  Novel Coronavirus, NAA (hospital order; send-out to ref lab)     Status: None   Collection Time: 09/01/18 10:10 AM  Result Value Ref Range Status   SARS-CoV-2, NAA NOT DETECTED NOT DETECTED Final    Comment: (NOTE) This test was developed and its performance characteristics determined by [employer]. This test has not been FDA cleared or approved. This test has been authorized by FDA under an Emergency Use Authorization (EUA). This test is only authorized for the duration of time the declaration that circumstances exist justifying the authorization of the emergency use of in vitro diagnostic tests for detection of SARS-CoV-2 virus and/or diagnosis of  COVID-19 infection under section 564(b)(1) of the Act, 21 U.S.C. 326ZTI-4(P)(8), unless the authorization is terminated or revoked sooner. When diagnostic testing is negative, the possibility of a false negative result should be considered in the context of a patient's recent exposures and the presence of clinical signs and symptoms consistent with COVID-19. An individual without symptoms of COVID-19 and who is not shedding SARS-CoV-2 virus would expect to have a negative (not detected) result in this assay. Performed  At: [facility] [address] [place], [place] [id number] Rush Farmer MD KN:[id number]    [place]  Final    Comment: Performed at [facility], [place] [address], [place], [place] [address]      Radiology Studies: No results found.   Scheduled Meds: . sodium chloride   Intravenous Once  . amiodarone  200 mg Oral Daily  . carvedilol  3.125 mg Oral BID WC  . chlorhexidine  15 mL Mouth Rinse BID  . furosemide  40 mg Intravenous Q12H  . insulin aspart  0-20 Units Subcutaneous Q6H  . ipratropium-albuterol  3 mL Nebulization Q6H  . mouth rinse  15 mL Mouth Rinse q12n4p  . methylPREDNISolone (SOLU-MEDROL) injection  40 mg Intravenous Q12H  . mupirocin ointment   Nasal BID  . pantoprazole (PROTONIX) IV  40 mg Intravenous QHS  . saccharomyces boulardii  250 mg Oral BID   Continuous Infusions: . TPN ADULT (ION) 60 mL/hr at 09/05/18 0600     LOS: 23 days   Time Spent in minutes   30 minutes  Vieva Brummitt D.O. on 09/05/2018 at 8:42 AM  Between 7am to 7pm - Please see pager noted on [email or web address]  After 7pm go to [email or web address]  And look for the night coverage person covering for me after hours  [facility] Office  [phone]

## 2018-09-05 NOTE — Progress Notes (Addendum)
Nutrition Follow-up  DOCUMENTATION CODES:   Severe malnutrition in context of acute illness/injury  INTERVENTION:   If intake continues to be poor-consider placement of Cortrak with enteral feedings.   Continue TPN @ 60 ml/hr- management per pharmacy   Ensure Enlive po BID, each supplement provides 350 kcal and 20 grams of protein  Magic cup TID with meals, each supplement provides 290 kcal and 9 grams of protein  MVI daily  Assistance with all meal   NUTRITION DIAGNOSIS:   Severe Malnutrition related to acute illness(SBO) as evidenced by energy intake < or equal to 50% for > or equal to 5 days, mild fat depletion, moderate muscle depletion.  Ongoing   GOAL:   Patient will meet greater than or equal to 90% of their needs  Met via TPN  MONITOR:   Diet advancement, Weight trends, Labs, Vent status, TF tolerance, Skin, I & O's  REASON FOR ASSESSMENT:   Ventilator    ASSESSMENT:   Patient with PMH significant for anemia, CAD s/p AICD placement, breast cancer, CKD III, CHF, HLD, impaired hearing, and RA. Presented to [facility] on 4/23 with CAP and SBO.    4/28- CT scan showed persistent SBO, tx to [facility] for surgery 4/30- ex lap, lysis of adhesions, small bowel resection due to ischemic changes 5/3- extubated 5/4- TPN started  5/5- NGT removed  5/6- diet advanced to full liquid 5/9- diet advanced DYS 1, thin liquids   Attempted to see pt at bedside, she was unable to answer RD questions. RN reports pt hasn't had much to eat today. She ate a small amount of applesauce with her medications but not much else. Meal completions charted as 15-50% of pt's last four meals. She has not been drinking Ensure. Will increase supplementation. Pt needs feeding assistance with all meals/supplements. If pt continues with poor intake recommend consideration of enteral feedings in attempt to wean from TPN.   Pt tolerating TPN @ 60 ml/hr to provide 75 grams protein and 2002 kcal. Meets 100%  kcal needs and 75% of protein needs. Plan to keep TPN @ goal rate until Monday.   Weight noted to decrease from 76.7 kg on 5/11 to 68.8 kg today.    I/O: -1,096 ml since 5/1 UOP: 1,100 ml x 24 hrs   Drips: TPN Medications: amiodarone, 40 mg lasix BID, SS novolog, solumedrol Labs: Na 146 (H) K 3.1 (L) LFTs slight elevated CBG 170-276  Diet Order:   Diet Order            DIET - DYS 1 Room service appropriate? No; Fluid consistency: Thin  Diet effective now              EDUCATION NEEDS:   Not appropriate for education at this time  Skin:  Skin Assessment: Skin Integrity Issues: Skin Integrity Issues:: Stage II, Incisions, Other (Comment) Stage II: buttocks Incisions: open abdomen Other: MASD- right thigh, right lower arm wound   Last BM:  5/14- liquid   Height:   Ht Readings from Last 1 Encounters:  08/21/18 5' 4" (1.626 m)    Weight:   Wt Readings from Last 1 Encounters:  09/05/18 68.8 kg    Ideal Body Weight:  54.5 kg  BMI:  Body mass index is 26.04 kg/m.  Estimated Nutritional Needs:   Kcal:  2000-2200 kcal  Protein:  100-120 grams  Fluid:  >/= 2 L/day    Mariana Single RD, LDN Clinical Nutrition Pager # - [phone]

## 2018-09-05 NOTE — Progress Notes (Signed)
SLP Cancellation Note  Patient Details Name: [name] MRN: [id number] DOB: 1940-10-28   Cancelled treatment:       Reason Eval/Treat Not Completed: Patient declined, stating she has "good days and bad days" and that she was not feeling up to eating or drinking with SLP this afternoon. Will f/u as able.   [name] 09/05/2018, 3:10 PM  Pollyann Glen, M.A. [place] [facility] Environmental education officer [phone] Office [phone]

## 2018-09-05 NOTE — Progress Notes (Signed)
PHARMACY - ADULT TOTAL PARENTERAL NUTRITION CONSULT NOTE   Pharmacy Consult for TPN Indication: s/p ex lap with SBR for SBO and ischemic/necroticbowel  Patient Measurements: Height: '5\' 4"'$  (162.6 cm) Weight: 151 lb 10.8 oz (68.8 kg) IBW/kg (Calculated) : 54.7 TPN AdjBW (KG): 64.8 Body mass index is 26.04 kg/m.  Assessment:  78 year old female with PMH significant for anemia, CAD s/p AICD placement, breast cancer, CKD III, CHF, HLD, impaired hearing, and RA. She presented to [facility] on 4/23 with CAP and SBO. CT on 4/28 show persistent SBO and patient was transferred to [facility] for surgery. On 4/30, an ex lap, lysis of adhesions and small bowel resection was performed. As of 5/4, Patient has been ~12 days without nutrition and pharmacy was consulted 5/4 for TPN with plan for continued bowel rest and NG until bowel function returns.  GI: NGT removed 5/5, JP drain none documented. PPI IV. Pre-albumin <5 >>8.6. No N/V. +Flatus. LBM 5/14 (flexiseal in place, loose stool). Dyphagia 1 diet initiated 5/9. Endo: No history of diabetes. CBGs 170-205 -on steroids (being slowly weaned). Insulin requirements in the past 24 hours: 43 units of moderate SSI. Lytes: K 3.1 (replaced), Phos 5, Mg 1.9 on 5/13; Corr Ca 10.1 (alb 1.5), CO2 30, s/p Bicarb drip (maximizing acetate in TPN will change ratio today) and bicarb tablet added. Renal: Scr 2.27 (down), BUN up 156 in setting of IV Lasix and AKI, off CRRT - no plan for dialysis. UOP 0.7 mL/kg/hr.   Pulm: Extubated on 5/3. Required BiPAP 5/12, now back on 2L [place]. Steroids (weaning) + Lasix. Cards: Amiodarone po (s/p VT arrest earlier in admission); V-paced at 70, BP ok. Hepatobil: AST up 60; T Bili down to 2.5; ALT 46. Alk Phos 281. TG 81. Neuro: oriented to person only, complains of abdominal pain ID: WBC up slightly to 11.8, afebrile off Zosyn.   TPN Access: CVC triple lumen placed 4/30 TPN start date: 5/4 Nutritional Goals (per RD recommendation on 5/11): Kcal:  2000-2200 kcal Protein:  100-120 grams (decreased to 75 gms or 1 gm/kg due to rising BUN) Fluid:  >/= 2 L/day   Goal TPN rate is 60 ml/hr (provides 75 g of protein, 288 g of dextrose, and 60 g of lipids which provides 2002 kCals per day, meeting 100% of patient needs) -- reduced protein to 1 gm/kg due to rising BUN and concentrated to reduce volume.   Current Nutrition:  Dysphagia 1, puree solids diet + Magic cup - charted 40% of 2 meals yesterday TPN at goal - per Surgery - keep full TPN at least until Monday (5/18) to review prealbumin  Plan:  Continue TPN at 60 mL/hr  This TPN provides  75g of protein, 288g of dextrose, and 60g of lipids which provides 2002 kCals per day, meeting 100% of patient's protein and calorie needs. Electrolytes in TPN: Per discussion with Dr. Algis Liming, ok to add back lytes in TPN cautiously- continue current reduced amounts. Remove calcium. Change MP:NTIRWER ratio to 1:2. Add MVI to TPN - trace elements are on back order, will only place in TPN on Monday, Wednesday and Friday *CBGs uncontrolled -possible wean of TPN soon with increased po intake. Feel like this is steroid-induced ( now weaning steroids)  as CBG's were controlled on TPN at goal prior to start of TPN - would prefer to target outside of TPN. Increased to resistant SSI q6h and adjust as needed.  Monitor TPN labs Follow-up renal function and diuresis. Follow-up diet/intake amounts and  ability to wean TPN Re-evaluate volume status - TPN currently concentrated  Alanda Slim, PharmD, [facility] Clinical Pharmacist Please see AMION for all Pharmacists' Contact Phone Numbers 09/05/2018, 7:22 AM

## 2018-09-05 NOTE — Progress Notes (Addendum)
15 Days Post-Op  Subjective: No complaints this morning. Tolerating dressing changes. Abdominal pain "much better". Tolerating DYS1 diet without N/V. BM yesterday x 1. No bloody BM's reported.   Objective: Vital signs in last 24 hours: Temp:  [97.5 F (36.4 C)-98.1 F (36.7 C)] 97.6 F (36.4 C) (05/15 0325) Pulse Rate:  [70-75] 70 (05/15 0325) Resp:  [12-19] 12 (05/15 0325) BP: (116-133)/(48-59) 122/56 (05/15 0325) SpO2:  [95 %-100 %] 99 % (05/15 0325) Weight:  [68.8 kg] 68.8 kg (05/15 0325) Last BM Date: 09/04/18  Intake/Output from previous day: 05/14 0701 - 05/15 0700 In: 1209.9 [P.O.:358; I.V.:851.9] Out: 1100 [Urine:1100] Intake/Output this shift: No intake/output data recorded.  PE: Gen: Alert, NAD, pleasant Heart: Regular rate Pulm: Normal effort and rate Abd: Soft, NT/ND, +BS,open midline incision with some granulation tissue at base and on wallsandfibrinous exudate coveras below. There is sepration without evisceration. See picture below.  Msk: No pedal edema. SCD in place Skin: warm and dry     Lab Results:  Recent Labs    09/04/18 0511 09/04/18 0640 09/05/18 0325  WBC 11.8* 9.4  --   HGB 4.5* 7.1* 7.5*  HCT 14.4* 22.6* 24.1*  PLT 304 293  --    BMET Recent Labs    09/04/18 0511 09/05/18 0325  NA 144 146*  K 3.5 3.1*  CL 101 103  CO2 27 30  GLUCOSE 189* 173*  BUN 153* 156*  CREATININE 2.51* 2.27*  CALCIUM 8.3* 8.1*   PT/INR No results for input(s): LABPROT, INR in the last 72 hours. CMP     Component Value Date/Time   NA 146 (H) 09/05/2018 0325   K 3.1 (L) 09/05/2018 0325   CL 103 09/05/2018 0325   CO2 30 09/05/2018 0325   GLUCOSE 173 (H) 09/05/2018 0325   BUN 156 (H) 09/05/2018 0325   CREATININE 2.27 (H) 09/05/2018 0325   CREATININE 1.07 (H) 02/13/2018 1142   CALCIUM 8.1 (L) 09/05/2018 0325   PROT 5.4 (L) 09/04/2018 0511   ALBUMIN 1.5 (L) 09/04/2018 0511   AST 60 (H) 09/04/2018 0511   ALT 46 (H) 09/04/2018 0511   ALKPHOS 281 (H) 09/04/2018 0511   BILITOT 2.5 (H) 09/04/2018 0511   GFRNONAA 20 (L) 09/05/2018 0325   GFRNONAA 49 (L) 02/13/2018 1142   GFRAA 23 (L) 09/05/2018 0325   GFRAA 57 (L) 02/13/2018 1142   Lipase     Component Value Date/Time   LIPASE 30 08/13/2018 1926       Studies/Results: No results found.  Anti-infectives: Anti-infectives (From admission, onward)   Start     Dose/Rate Route Frequency Ordered Stop   08/23/18 1400  piperacillin-tazobactam (ZOSYN) IVPB 2.25 g     2.25 g 100 mL/hr over 30 Minutes Intravenous Every 8 hours 08/23/18 0928 08/30/18 2230   08/21/18 1200  piperacillin-tazobactam (ZOSYN) IVPB 3.375 g  Status:  Discontinued     3.375 g 12.5 mL/hr over 240 Minutes Intravenous Every 8 hours 08/21/18 1102 08/23/18 0928   08/14/18 1000  hydroxychloroquine (PLAQUENIL) tablet 200 mg  Status:  Discontinued    Note to Pharmacy:  Take 1 tablet by mouth daily Monday through Friday only     200 mg Oral Once per day on Mon Tue Wed Thu Fri 08/14/18 0742 08/21/18 1147   08/13/18 2200  doxycycline (VIBRA-TABS) tablet 100 mg  Status:  Discontinued     100 mg Oral Every 12 hours 08/13/18 2134 08/13/18 2135   08/13/18 2200  doxycycline (  VIBRAMYCIN) 100 mg in sodium chloride 0.9 % 250 mL IVPB  Status:  Discontinued     100 mg 125 mL/hr over 120 Minutes Intravenous Every 12 hours 08/13/18 2135 08/19/18 1702   08/13/18 2145  cefTRIAXone (ROCEPHIN) 1 g in sodium chloride 0.9 % 100 mL IVPB  Status:  Discontinued     1 g 200 mL/hr over 30 Minutes Intravenous Every 24 hours 08/13/18 2134 08/14/18 0743       Assessment/Plan POD15, s/p ex lapw/SBR, LOA, appendectomyfor SBO&ischemic/necroticbowel, 4/30 - PT - Extubated.Offpressors.Out of unit.AppreciateTRHhelp in her care - ContTPNtill inc intake. Pre-alb8.6on 5/11, improving.Hopefully can be weaned soon.DYS1dietand as tolerated per speech(swallow study on 5/12) - Bloody BM5/7.[place] held.no bloody  BMs since then -continueTIDWTD dressing changes  -JPremoved -completed zosyn x10 days, patient afebrile - Path benign. 58cm SB w/ hemorrhage and prominent necrosis w/ viable margins.Appendix w/ fibrous obliteration and patchy acute serositis. - Mobilize and IS  Acute on chronic renal failure- Cr 2.27stable,Defer fluid balance to TRH.Foley out  ICD/CAD/CHF-per cardiology,on amio (oral)  Acute Respiratory Failure- Off vent.Improving. Per TRH.   ABL Anemia-given 1 unit PRBCs forHg 6.7(5/9), post-transfusion hgb 7.9. hgb 7.0 this AM. No tachycardia. BP stable. Hgb 5/14 4.5, but when rechecked 7.1. Hgb 7.5 this AM - no signs of external bleeding this morning, no blood in stool. VSS  FEN -TPN,DYS1 (AAT per speech) VTE - SCDs, Mobilize as tolerating with PT. ID -zosyn 4/30>>5/9.WBC9.4 on 5/14, afebrile Foley - Removed 5/7 Follow up - Dr. Marlou Starks  Plan: Abdominal exam benign. Continue Dysphagia 1 diet, ok to advance if cleared by speech therapy. No signs of bleeding today, continue to monitor H/H.Mobilize as able. Continue TID dressing changes. Will see again on Monday. Please call sooner with any concerns.    LOS: 23 days    Jillyn Ledger , [facility] 09/05/2018, 8:06 AM Pager: [phone]

## 2018-09-05 NOTE — Progress Notes (Addendum)
Inpatient Diabetes Program Recommendations  AACE/ADA: New Consensus Statement on Inpatient Glycemic Control (2015)  Target Ranges:  Prepandial:   less than 140 mg/dL      Peak postprandial:   less than 180 mg/dL (1-2 hours)      Critically ill patients:  140 - 180 mg/dL   Lab Results  Component Value Date   GLUCAP 205 (H) 09/05/2018   HGBA1C 5.8 (H) 09/03/2018    Review of Glycemic Control  Diabetes history: No DM  Current orders for Inpatient glycemic control: Novolog 0-20 units Q6 hours  Inpatient Diabetes Program Recommendations:    Patient receiving IV Solumedrol 40 mg Q12 hours. Patient also on TPN.  Glucose trends consistently in the 200 range. Patient already on Novolog 0-20 units Q6 hours.  If steroid dose stays at current dose, may consider adding Lantus 8 units.   Thanks,  Tama Headings RN, MSN, BC-ADM Inpatient Diabetes Coordinator Team Pager [phone] (8a-5p)

## 2018-09-06 LAB — HEMOGLOBIN AND HEMATOCRIT, BLOOD
HCT: 25.9 % — ABNORMAL LOW (ref 36.0–46.0)
Hemoglobin: 8.1 g/dL — ABNORMAL LOW (ref 12.0–15.0)

## 2018-09-06 LAB — GLUCOSE, CAPILLARY
Glucose-Capillary: 173 mg/dL — ABNORMAL HIGH (ref 70–99)
Glucose-Capillary: 300 mg/dL — ABNORMAL HIGH (ref 70–99)
Glucose-Capillary: 339 mg/dL — ABNORMAL HIGH (ref 70–99)

## 2018-09-06 LAB — BASIC METABOLIC PANEL
Anion gap: 15 (ref 5–15)
BUN: 160 mg/dL — ABNORMAL HIGH (ref 8–23)
CO2: 32 mmol/L (ref 22–32)
Calcium: 8.6 mg/dL — ABNORMAL LOW (ref 8.9–10.3)
Chloride: 104 mmol/L (ref 98–111)
Creatinine, Ser: 2.18 mg/dL — ABNORMAL HIGH (ref 0.44–1.00)
GFR calc Af Amer: 24 mL/min — ABNORMAL LOW (ref >60)
GFR calc non Af Amer: 21 mL/min — ABNORMAL LOW (ref >60)
Glucose, Bld: 155 mg/dL — ABNORMAL HIGH (ref 70–99)
Potassium: 3.6 mmol/L (ref 3.5–5.1)
Sodium: 151 mmol/L — ABNORMAL HIGH (ref 135–145)

## 2018-09-06 MED ORDER — TRACE MINERALS CR-CU-MN-SE-ZN 10-1000-500-60 MCG/ML IV SOLN
INTRAVENOUS | Status: AC
  Administered 2018-09-06: 18:00:00 via INTRAVENOUS
  Filled 2018-09-06: qty 499.2

## 2018-09-06 MED ORDER — IPRATROPIUM-ALBUTEROL 0.5-2.5 (3) MG/3ML IN SOLN
3.0000 mL | Freq: Two times a day (BID) | RESPIRATORY_TRACT | Status: DC
  Administered 2018-09-06 – 2018-09-08 (×4): 3 mL via RESPIRATORY_TRACT
  Filled 2018-09-06 (×4): qty 3

## 2018-09-06 NOTE — Progress Notes (Signed)
PROGRESS NOTE    [name]  TDV:[id number] DOB: [birth year] DOA: 08/13/2018 PCP: [name], MD   Brief Narrative:  HPI on 08/13/2018 by Dr. Cherylann Parr is a 78 y.o. female with medical history significant of anemia chronic disease, ASCVD, AICD placement, history of breast cancer, chronic bronchitis, stage III chronic renal disease, systolic heart failure, hyperlipidemia, GERD, history of GI bleed, gout, hypertension, rheumatoid arthritis, impaired hearing, history of UTI who is coming to the emergency department with complaints of abdominal pain apparently for the past 3 days associated with decreased appetite, several episodes of diarrhea, nausea, but no emesis.  She denies fever, cough, sore throat, chest pain, dysuria, frequency, hematuria, melena or hematochezia.  She denies polyuria, polydipsia, polyphagia or blurred vision.  History is limited by the patient severe hearing impairment.  Interim history Patient was admitted with small bowel obstruction, failed conservative management and was transferred to [name].  General surgery consulted and appreciated, patient underwent ex lap with lysis of adhesions, bowel resection for ischemic bowel.  She continued on the ventilator was put in ICU as she was also hypotensive and started on pressors.  Patient was also shocked for VT.  She developed progressive renal failure, oliguria.  She was weaned off of pressors and extubated. And TRH assumed care. Started on antibiotics for possible pneumonia.  Patient now with prolonged postop ileus, ongoing TNA, diet gradually being advanced per speech therapy.  She was also noted to have a self-limiting GI bleed.  Patient had acute respiratory distress on 09/01/2018 with decompensated CHF and upper airway wheezing.  PCCM consulted.  Assessment & Plan   Acute respiratory failure with hypoxia -Noted on 09/01/2018.  Suspected to be due to decompensated CHF and a  component of bronchospasm. -PCCM consulted, patient was briefly placed on BiPAP for 3 to 4 hours, given IV Lasix and Solu-Medrol -Has now improved, continuing IV Lasix.  Tapering steroids at this time.  Continuing Bronchodilators and BiPAP as needed  Septic shock -Resolved and stable -Status post VDRF and pressure support.  Patient extubated on 08/24/2018.  Weaned off pressors on 08/23/2018  Small bowel obstruction, ischemic colon /Postop ileus -Patient failed conservative management of bowel obstruction at [facility] -General surgery consulted and appreciated -Status post expiratory laparotomy for SBO and small bowel section of ischemic necrotic bowel -Completed 10 days of IV Zosyn -NG tube was discontinued -Wound VAC removed on 08/27/2018.  Continue wet to dry dressing changes -Currently on dysphagia 1 diet and tolerating -Noted to have diarrhea on 08/28/2018, Flexi-Seal was placed.  Low index of suspicion for C. difficile in the absence of abdominal pain or leukocytosis.  Diarrhea has now resolved and Flexi-Seal discontinued -patient having daily bowel movements -Continue TNA until patient is able to increase intake  Diarrhea -As above, Has now resolved. -Suspect related to resolving postoperative ileus  Acute lower GI bleed -As per surgery, felt to be due to staple line -Heparin was discontinued, currently on SCDs for DVT prophylaxis -GI bleed has resolved -Hemoglobin currently 8.1 -Continue to monitor CBC  Acute kidney injury on chronic kidney disease, stage III -Baseline creatinine approximately 1.5-1.8, peaked to 3.2 in the setting of septic shock, ATN and ischemic bowel -Creatinine currently 2.18- stable in the 2.5 range, possibly new baseline -Continue to monitor BMP  Hypophosphatemia/hypomagnesemia -Replaced, will continue to monitor and replace as needed  Hypernatremia -secondary to TPN -discussed with pharmacy -will continue to monitor   Non-anion gap metabolic acidosis -  Due  to diarrhea and mild hyperchloremia -Resolved with sodium bicarbonate  Acute on chronic systolic CHF -Echocardiogram 08/19/2018 shows an EF of 30%.  Inferior basal and inferoseptal akinesis.  Systolic function indeterminate. -Was off of diuretics, ACE inhibitor in the setting of acute kidney injury and septic shock -Patient developed progressive dyspnea and wheezing on 08/31/2018.  Thought to be due to fluids as well as TNA -Patient was given IV Lasix on 09/01/2018 with good urine output -Continue to monitor intake and output, daily weights  Acute on chronic anemia -Patient did receive 1 unit PRBC this hospitalization -Anemia panel suggestive of anemia of chronic disease -Hemoglobin currently 8.1 -Continue to monitor CBC  Paroxysmal atrial fibrillation/transient NSVT -Status post DC cardioversion 08/20/2018, currently in sinus rhythm -No anticoagulation given GI bleeding issues -Continue amiodarone, Coreg -Need to follow-up with cardiology, Dr. [name]  Severe protein calorie malnutrition -Currently on TNA and dysphagia 1 diet -speech therapy following  CAD -Complaints of chest pain -Continue Coreg  Thrombocytopenia -recovered and resolved  Hyperglycemia/ prediabetes -Suspect secondary to steroids and tapering. -Continue insulin sliding scale and CBG monitoring -Hemoglobin A1c 5.8  Physical deconditioning -Likely secondary to acute illness, prolonged hospitalization, underlying medical problems -PT and OT recommending SNF  DVT Prophylaxis  SCDs  Code Status: Full  Family Communication: None at bedside.  Disposition Plan: Admitted. Pending improvement in nutritional status and weaning of TPN.  SNF on discharge  Consultants PCCM General surgery Cardiology  Procedures  Cardioversion 08/20/2018 Ex laparotomy with lysis of adhesion, bowel resection for ischemic bowel  Antibiotics   Anti-infectives (From admission, onward)   Start     Dose/Rate Route Frequency  Ordered Stop   08/23/18 1400  piperacillin-tazobactam (ZOSYN) IVPB 2.25 g     2.25 g 100 mL/hr over 30 Minutes Intravenous Every 8 hours 08/23/18 0928 08/30/18 2230   08/21/18 1200  piperacillin-tazobactam (ZOSYN) IVPB 3.375 g  Status:  Discontinued     3.375 g 12.5 mL/hr over 240 Minutes Intravenous Every 8 hours 08/21/18 1102 08/23/18 0928   08/14/18 1000  hydroxychloroquine (PLAQUENIL) tablet 200 mg  Status:  Discontinued    Note to Pharmacy:  Take 1 tablet by mouth daily Monday through Friday only     200 mg Oral Once per day on Mon Tue Wed Thu Fri 08/14/18 0742 08/21/18 1147   08/13/18 2200  doxycycline (VIBRA-TABS) tablet 100 mg  Status:  Discontinued     100 mg Oral Every 12 hours 08/13/18 2134 08/13/18 2135   08/13/18 2200  doxycycline (VIBRAMYCIN) 100 mg in sodium chloride 0.9 % 250 mL IVPB  Status:  Discontinued     100 mg 125 mL/hr over 120 Minutes Intravenous Every 12 hours 08/13/18 2135 08/19/18 1702   08/13/18 2145  cefTRIAXone (ROCEPHIN) 1 g in sodium chloride 0.9 % 100 mL IVPB  Status:  Discontinued     1 g 200 mL/hr over 30 Minutes Intravenous Every 24 hours 08/13/18 2134 08/14/18 0743      Subjective:   Cathlean Cower seen and examined today.  No complaints today.  Feels abdominal pain is improving.  Denies current chest pain, shortness of breath, nausea or vomiting, dizziness or headache.  Has been having bowel movements. Objective:   Vitals:   09/05/18 2335 09/06/18 0354 09/06/18 0819 09/06/18 0823  BP: 137/67 (!) 122/55  (!) 122/46  Pulse: 73 77  82  Resp: 14 20  15   Temp: 98.2 F (36.8 C) 97.7 F (36.5 C)  97.6 F (36.4 C)  TempSrc: Oral Oral  Oral  SpO2: 99% 98% 96% 100%  Weight:      Height:        Intake/Output Summary (Last 24 hours) at 09/06/2018 0839 Last data filed at 09/06/2018 0600 Gross per 24 hour  Intake 1876.25 ml  Output 1600 ml  Net 276.25 ml   Filed Weights   09/01/18 0933 09/02/18 0650 09/05/18 0325  Weight: 76.7 kg 73 kg 68.8 kg    Exam  General: Well developed, chronically ill-appearing, NAD  HEENT: NCAT,  mucous membranes moist.   Cardiovascular: S1 S2 auscultated, RRR  Respiratory: Clear to auscultation bilaterally with equal chest rise  Abdomen: Soft, nontender, nondistended, + bowel sounds, dressing in place  Extremities: warm dry without cyanosis clubbing or edema  Neuro: AAOx3, hard of hearing otherwise nonfocal  Psych: Pleasant, appropriate mood and affect  Data Reviewed: I have personally reviewed following labs and imaging studies  CBC: Recent Labs  Lab 09/01/18 0628 09/02/18 0814 09/03/18 0440 09/03/18 0701 09/04/18 0511 09/04/18 0640 09/05/18 0325 09/06/18 0500  WBC 11.2* 9.0 10.1  --  11.8* 9.4  --   --   NEUTROABS 8.6*  --   --   --   --   --   --   --   HGB 7.3* 7.3* 7.0* 7.0* 4.5* 7.1* 7.5* 8.1*  HCT 22.1* 22.4* 21.7* 21.8* 14.4* 22.6* 24.1* 25.9*  MCV 81.9 82.7 84.8  --  86.2 85.6  --   --   PLT 162 193 211  --  304 293  --   --    Basic Metabolic Panel: Recent Labs  Lab 08/31/18 0524 09/01/18 0628 09/02/18 0500 09/03/18 0440 09/04/18 0511 09/05/18 0325 09/06/18 0500  NA 142 143 140 142 144 146* 151*  K 4.1 4.3 4.0 3.7 3.5 3.1* 3.6  CL 110 108 103 105 101 103 104  CO2 20* 23 21* 25 27 30  32  GLUCOSE 121* 117* 218* 213* 189* 173* 155*  BUN 81* 95* 117* 138* 153* 156* 160*  CREATININE 2.34* 2.52* 2.52* 2.53* 2.51* 2.27* 2.18*  CALCIUM 8.1* 8.4* 8.7* 8.7* 8.3* 8.1* 8.6*  MG 1.8 1.8  --   --  1.9  --   --   PHOS 3.3 3.8  --  4.8* 5.0*  --   --    GFR: Estimated Creatinine Clearance: 19.9 mL/min (A) (by C-G formula based on SCr of 2.18 mg/dL (H)). Liver Function Tests: Recent Labs  Lab 09/01/18 0628 09/03/18 0440 09/04/18 0511  AST 47* 52* 60*  ALT 24 34 46*  ALKPHOS 312* 323* 281*  BILITOT 3.1* 2.7* 2.5*  PROT 5.3* 5.5* 5.4*  ALBUMIN 1.2* 1.4* 1.5*   No results for input(s): LIPASE, AMYLASE in the last 168 hours. No results for input(s): AMMONIA in the  last 168 hours. Coagulation Profile: No results for input(s): INR, PROTIME in the last 168 hours. Cardiac Enzymes: No results for input(s): CKTOTAL, CKMB, CKMBINDEX, TROPONINI in the last 168 hours. BNP (last 3 results) No results for input(s): PROBNP in the last 8760 hours. HbA1C: No results for input(s): HGBA1C in the last 72 hours. CBG: Recent Labs  Lab 09/05/18 0607 09/05/18 1221 09/05/18 1712 09/05/18 2339 09/06/18 0601  GLUCAP 205* 270* 227* 337* 173*   Lipid Profile: No results for input(s): CHOL, HDL, LDLCALC, TRIG, CHOLHDL, LDLDIRECT in the last 72 hours. Thyroid Function Tests: No results for input(s): TSH, T4TOTAL, FREET4, T3FREE, THYROIDAB in the last 72 hours. Anemia Panel: No results  for input(s): VITAMINB12, FOLATE, FERRITIN, TIBC, IRON, RETICCTPCT in the last 72 hours. Urine analysis:    Component Value Date/Time   COLORURINE AMBER (A) 08/13/2018 1849   APPEARANCEUR CLOUDY (A) 08/13/2018 1849   LABSPEC 1.016 08/13/2018 1849   PHURINE 5.0 08/13/2018 1849   GLUCOSEU NEGATIVE 08/13/2018 1849   HGBUR MODERATE (A) 08/13/2018 1849   BILIRUBINUR NEGATIVE 08/13/2018 1849   KETONESUR NEGATIVE 08/13/2018 1849   PROTEINUR 30 (A) 08/13/2018 1849   UROBILINOGEN 0.2 07/17/2013 2240   NITRITE NEGATIVE 08/13/2018 1849   LEUKOCYTESUR TRACE (A) 08/13/2018 1849   Sepsis Labs: @LABRCNTIP (procalcitonin:4,lacticidven:4)  ) Recent Results (from the past 240 hour(s))  Novel Coronavirus, NAA (hospital order; send-out to ref lab)     Status: None   Collection Time: 09/01/18 10:10 AM  Result Value Ref Range Status   SARS-CoV-2, NAA NOT DETECTED NOT DETECTED Final    Comment: (NOTE) This test was developed and its performance characteristics determined by [employer]. This test has not been FDA cleared or approved. This test has been authorized by FDA under an Emergency Use Authorization (EUA). This test is only authorized for the duration of time the declaration  that circumstances exist justifying the authorization of the emergency use of in vitro diagnostic tests for detection of SARS-CoV-2 virus and/or diagnosis of COVID-19 infection under section 564(b)(1) of the Act, 21 U.S.C. 287OMV-6(H)(2), unless the authorization is terminated or revoked sooner. When diagnostic testing is negative, the possibility of a false negative result should be considered in the context of a patient's recent exposures and the presence of clinical signs and symptoms consistent with COVID-19. An individual without symptoms of COVID-19 and who is not shedding SARS-CoV-2 virus would expect to have a negative (not detected) result in this assay. Performed  At: [facility] [address] [place], [place] [id number] Rush Farmer MD ZM:[id number]    Media  Final    Comment: Performed at [facility], [place] [address], [place], [place] [address]      Radiology Studies: No results found.   Scheduled Meds:  amiodarone  200 mg Oral Daily   carvedilol  3.125 mg Oral BID WC   chlorhexidine  15 mL Mouth Rinse BID   feeding supplement (ENSURE ENLIVE)  237 mL Oral BID BM   furosemide  40 mg Intravenous Q12H   insulin aspart  0-20 Units Subcutaneous Q6H   ipratropium-albuterol  3 mL Nebulization TID   mouth rinse  15 mL Mouth Rinse q12n4p   methylPREDNISolone (SOLU-MEDROL) injection  40 mg Intravenous Q12H   mupirocin ointment   Nasal BID   pantoprazole (PROTONIX) IV  40 mg Intravenous QHS   saccharomyces boulardii  250 mg Oral BID   Continuous Infusions:  TPN ADULT (ION) 60 mL/hr at 09/06/18 0600     LOS: 24 days   Time Spent in minutes   30 minutes  Suki Crockett D.O. on 09/06/2018 at 8:39 AM  Between 7am to 7pm - Please see pager noted on [email or web address]  After 7pm go to [email or web address]  And look for the night coverage person covering for me after hours  [facility] Office  [phone]

## 2018-09-06 NOTE — Progress Notes (Signed)
PHARMACY - ADULT TOTAL PARENTERAL NUTRITION CONSULT NOTE   Pharmacy Consult for TPN Indication: s/p ex lap with SBR for SBO and ischemic/necroticbowel  Patient Measurements: Height: '5\' 4"'  (162.6 cm) Weight: 151 lb 10.8 oz (68.8 kg) IBW/kg (Calculated) : 54.7 TPN AdjBW (KG): 64.8 Body mass index is 26.04 kg/m.  Assessment:  78 year old female with PMH significant for anemia, CAD s/p AICD placement, breast cancer, CKD III, CHF, HLD, impaired hearing, and RA. She presented to [facility] on 4/23 with CAP and SBO. CT on 4/28 show persistent SBO and patient was transferred to [facility] for surgery. On 4/30, an ex lap, lysis of adhesions and small bowel resection was performed. As of 5/4, Patient has been ~12 days without nutrition and pharmacy was consulted 5/4 for TPN with plan for continued bowel rest and NG until bowel function returns.  GI: NGT removed 5/5, JP drain none documented. PPI IV. Pre-albumin <5 >>8.6. No N/V. +Flatus. LBM 5/14 (flexiseal in place, loose stool). Dyphagia 1 diet initiated 5/9. Endo: No history of diabetes. CBGs 170-205 -on steroids (being slowly weaned). Insulin requirements in the past 24 hours: 40 units of moderate SSI. Lytes: Na 151, K 3.6 Renal: Scr 2.27>2.18 (down), BUN up 156 in setting of IV Lasix and AKI, off CRRT - no plan for dialysis. UOP 0.7 mL/kg/hr.   Pulm: Extubated on 5/3. Required BiPAP 5/12, now back on 2L [place]. Steroids (weaning) + Lasix. Cards: Amiodarone po (s/p VT arrest earlier in admission); V-paced at 70, BP ok. Hepatobil: AST up 60; T Bili down to 2.5; ALT 46. Alk Phos 281. TG 81. Neuro: oriented to person only, complains of abdominal pain ID: WBC up slightly to 11.8, afebrile off Zosyn.   TPN Access: CVC triple lumen placed 4/30 TPN start date: 5/4 Nutritional Goals (per RD recommendation on 5/11): Kcal: 2000-2200 kcal Protein:  100-120 grams (decreased to 75 gms or 1 gm/kg due to rising BUN) Fluid:  >/= 2 L/day   Goal TPN rate is 60 ml/hr  (provides 75 g of protein, 288 g of dextrose, and 60 g of lipids which provides 2002 kCals per day, meeting 100% of patient needs) -- reduced protein to 1 gm/kg due to rising BUN and concentrated to reduce volume.   Current Nutrition:  Dysphagia 1, puree solids diet + Magic cup - charted 40% of 2 meals yesterday TPN at goal - per Surgery - keep full TPN at least until Monday (5/18) to review prealbumin  Plan:  Continue TPN at 60 mL/hr  This TPN provides  75g of protein, 288g of dextrose, and 60g of lipids which provides 2002 kCals per day, meeting 100% of patient's protein and calorie needs. Electrolytes in TPN: Per discussion with Dr. Algis Liming, ok to add back lytes in TPN cautiously- continue current reduced amounts. Remove calcium. Change LE:XNTZGYF ratio to 1:2. - further reduce Na Add MVI to TPN - trace elements are on back order, will only place in TPN on Monday, Wednesday and Friday *CBGs uncontrolled -possible wean of TPN soon with increased po intake. Feel like this is steroid-induced ( now weaning steroids)  as CBG's were controlled on TPN at goal prior to start of TPN - would prefer to target outside of TPN. Increased to resistant SSI q6h and adjust as needed. - Continue current plan Monitor TPN labs Follow-up renal function and diuresis. Follow-up diet/intake amounts and ability to wean TPN Re-evaluate volume status - TPN currently concentrated  Levester Fresh, PharmD, BCPS, [facility] Clinical Pharmacist [phone]  Please check AMION for all [place] numbers  09/06/2018 8:56 AM

## 2018-09-07 LAB — BASIC METABOLIC PANEL
Anion gap: 14 (ref 5–15)
BUN: 157 mg/dL — ABNORMAL HIGH (ref 8–23)
CO2: 31 mmol/L (ref 22–32)
Calcium: 8.6 mg/dL — ABNORMAL LOW (ref 8.9–10.3)
Chloride: 103 mmol/L (ref 98–111)
Creatinine, Ser: 2.08 mg/dL — ABNORMAL HIGH (ref 0.44–1.00)
GFR calc Af Amer: 26 mL/min — ABNORMAL LOW (ref >60)
GFR calc non Af Amer: 22 mL/min — ABNORMAL LOW (ref >60)
Glucose, Bld: 248 mg/dL — ABNORMAL HIGH (ref 70–99)
Potassium: 3.7 mmol/L (ref 3.5–5.1)
Sodium: 148 mmol/L — ABNORMAL HIGH (ref 135–145)

## 2018-09-07 LAB — HEMOGLOBIN AND HEMATOCRIT, BLOOD
HCT: 28.8 % — ABNORMAL LOW (ref 36.0–46.0)
Hemoglobin: 8.9 g/dL — ABNORMAL LOW (ref 12.0–15.0)

## 2018-09-07 LAB — GLUCOSE, CAPILLARY
Glucose-Capillary: 178 mg/dL — ABNORMAL HIGH (ref 70–99)
Glucose-Capillary: 187 mg/dL — ABNORMAL HIGH (ref 70–99)
Glucose-Capillary: 259 mg/dL — ABNORMAL HIGH (ref 70–99)
Glucose-Capillary: 309 mg/dL — ABNORMAL HIGH (ref 70–99)

## 2018-09-07 MED ORDER — METHYLPREDNISOLONE SODIUM SUCC 40 MG IJ SOLR
40.0000 mg | Freq: Every day | INTRAMUSCULAR | Status: DC
  Administered 2018-09-08 – 2018-09-10 (×3): 40 mg via INTRAVENOUS
  Filled 2018-09-07 (×3): qty 1

## 2018-09-07 MED ORDER — STERILE WATER FOR INJECTION IV SOLN
INTRAVENOUS | Status: AC
  Administered 2018-09-07: 17:00:00 via INTRAVENOUS
  Filled 2018-09-07: qty 499.2

## 2018-09-07 NOTE — Progress Notes (Addendum)
PHARMACY - ADULT TOTAL PARENTERAL NUTRITION CONSULT NOTE   Pharmacy Consult for TPN Indication: s/p ex lap with SBR for SBO and ischemic/necroticbowel  Patient Measurements: Height: 5' 4" (162.6 cm) Weight: 151 lb 10.8 oz (68.8 kg) IBW/kg (Calculated) : 54.7 TPN AdjBW (KG): 64.8 Body mass index is 26.04 kg/m.  Assessment:  78 year old female with PMH significant for anemia, CAD s/p AICD placement, breast cancer, CKD III, CHF, HLD, impaired hearing, and RA. She presented to [facility] on 4/23 with CAP and SBO. CT on 4/28 show persistent SBO and patient was transferred to [facility] for surgery. On 4/30, an ex lap, lysis of adhesions and small bowel resection was performed. As of 5/4, Patient has been ~12 days without nutrition and pharmacy was consulted 5/4 for TPN with plan for continued bowel rest and NG until bowel function returns.  GI: NGT removed 5/5, JP drain none documented. PPI IV. Pre-albumin <5 >>8.6. No N/V. +Flatus. LBM 5/14 (flexiseal in place, loose stool). Dyphagia 1 diet initiated 5/9. Endo: No history of diabetes. CBGs 170-205 -on steroids (being slowly weaned). Insulin requirements in the past 24 hours: 41 units of moderate SSI. Lytes: Na 151>148; K 3.7 Renal: Scr 2.27>2.18>2.08, off CRRT - no plan for dialysis. UOP 1.2 mL/kg/hr.   Pulm: Extubated on 5/3. Required BiPAP 5/12, now back on 2L [place]. Steroids weaning but CBGs still elevatyed + Lasix. Cards: Amiodarone po (s/p VT arrest earlier in admission); V-paced at 70, BP ok. Hepatobil: AST up 60; T Bili down to 2.5; ALT 46. Alk Phos 281. TG 81. Neuro: oriented to person only, complains of abdominal pain ID: WBC up slightly to 11.8, afebrile off Zosyn.   TPN Access: CVC triple lumen placed 4/30 TPN start date: 5/4 Nutritional Goals (per RD recommendation on 5/11): Kcal: 2000-2200 kcal Protein:  100-120 grams (decreased to 75 gms or 1 gm/kg due to rising BUN) Fluid:  >/= 2 L/day   Goal TPN rate is 60 ml/hr (provides 75 g of  protein, 288 g of dextrose, and 60 g of lipids which provides 2002 kCals per day, meeting 100% of patient needs) -- reduced protein to 1 gm/kg due to rising BUN and concentrated to reduce volume.   Current Nutrition:  Dysphagia 1, puree solids diet + Magic cup - charted 40% of 2 meals yesterday TPN at goal - per Surgery - keep full TPN at least until Monday (5/18) to review prealbumin  Plan:  Continue TPN at 60 mL/hr  This TPN provides  75g of protein, 288g of dextrose, and 60g of lipids which provides 2002 kCals per day, meeting 100% of patient's protein and calorie needs.  Electrolytes in TPN: Continue reduced Na, No Ca. Increase K, Unable to change MO:QHUTMLY ratio change to 1:1 d/t formulation, so will continue 1:2 Add MVI to TPN - trace elements are on back order, will only place in TPN on Monday, Wednesday and Friday Add 20 units insulin to TPN bag - trying to hold off d/t weaning of steroids, but still uncontrolled Continue resistant SSI  Monitor TPN labs Follow-up diet/intake amounts and ability to wean TPN  Levester Fresh, PharmD, BCPS, [facility] Clinical Pharmacist [phone]  Please check AMION for all [place] numbers  09/07/2018 8:22 AM

## 2018-09-07 NOTE — Progress Notes (Addendum)
PROGRESS NOTE    [name]  DQQ:[id number] DOB: June 11, 1940 DOA: 08/13/2018 PCP: [name], MD   Brief Narrative:  HPI on 08/13/2018 by Dr. Cherylann Parr is a 78 y.o. female with medical history significant of anemia chronic disease, ASCVD, AICD placement, history of breast cancer, chronic bronchitis, stage III chronic renal disease, systolic heart failure, hyperlipidemia, GERD, history of GI bleed, gout, hypertension, rheumatoid arthritis, impaired hearing, history of UTI who is coming to the emergency department with complaints of abdominal pain apparently for the past 3 days associated with decreased appetite, several episodes of diarrhea, nausea, but no emesis.  She denies fever, cough, sore throat, chest pain, dysuria, frequency, hematuria, melena or hematochezia.  She denies polyuria, polydipsia, polyphagia or blurred vision.  History is limited by the patient severe hearing impairment.  Interim history Patient was admitted with small bowel obstruction, failed conservative management and was transferred to [name].  General surgery consulted and appreciated, patient underwent ex lap with lysis of adhesions, bowel resection for ischemic bowel.  She continued on the ventilator was put in ICU as she was also hypotensive and started on pressors.  Patient was also shocked for VT.  She developed progressive renal failure, oliguria.  She was weaned off of pressors and extubated. And TRH assumed care. Started on antibiotics for possible pneumonia.  Patient now with prolonged postop ileus, ongoing TNA, diet gradually being advanced per speech therapy.  She was also noted to have a self-limiting GI bleed.  Patient had acute respiratory distress on 09/01/2018 with decompensated CHF and upper airway wheezing.  PCCM consulted.  Assessment & Plan   Acute respiratory failure with hypoxia -Noted on 09/01/2018.  Suspected to be due to decompensated CHF and a  component of bronchospasm. -PCCM consulted, patient was briefly placed on BiPAP for 3 to 4 hours, given IV Lasix and Solu-Medrol -Has now improved, continuing IV Lasix.  Tapering steroids at this time- will decrease solumedrol to 40mg  IV daily.  Continuing Bronchodilators and BiPAP as needed  Septic shock -Resolved and stable -Status post VDRF and pressure support.  Patient extubated on 08/24/2018.  Weaned off pressors on 08/23/2018  Small bowel obstruction, ischemic colon /Postop ileus -Patient failed conservative management of bowel obstruction at [facility] -General surgery consulted and appreciated -Status post expiratory laparotomy for SBO and small bowel section of ischemic necrotic bowel -Completed 10 days of IV Zosyn -NG tube was discontinued -Wound VAC removed on 08/27/2018.  Continue wet to dry dressing changes -Currently on dysphagia 1 diet and tolerating -Noted to have diarrhea on 08/28/2018, Flexi-Seal was placed.  Low index of suspicion for C. difficile in the absence of abdominal pain or leukocytosis.  Diarrhea has now resolved and Flexi-Seal discontinued -patient having daily bowel movements -Continue TNA until patient is able to increase intake  Diarrhea -As above, Has now resolved. -Suspect related to resolving postoperative ileus  Acute lower GI bleed -As per surgery, felt to be due to staple line -Heparin was discontinued, currently on SCDs for DVT prophylaxis -GI bleed has resolved -Hemoglobin currently 8.9 -Continue to monitor CBC  Acute kidney injury on chronic kidney disease, stage III -Baseline creatinine approximately 1.5-1.8, peaked to 3.2 in the setting of septic shock, ATN and ischemic bowel -Creatinine currently 2.08- stable in the 2.5 range, possibly new baseline -Continue to monitor BMP  Hypophosphatemia/hypomagnesemia -Replaced, will continue to monitor and replace as needed  Hypernatremia -secondary to TPN -pharmacy to adjust saline content -improved,  sodium down to 148 today -continue to monitor BMP  Non-anion gap metabolic acidosis -Due to diarrhea and mild hyperchloremia -Resolved with sodium bicarbonate  Acute on chronic systolic CHF -Echocardiogram 08/19/2018 shows an EF of 30%.  Inferior basal and inferoseptal akinesis.  Systolic function indeterminate. -Was off of diuretics, ACE inhibitor in the setting of acute kidney injury and septic shock -Patient developed progressive dyspnea and wheezing on 08/31/2018.  Thought to be due to fluids as well as TNA -Patient was given IV Lasix on 09/01/2018 with good urine output -Continue to monitor intake and output, daily weights  Acute on chronic anemia -Patient did receive 1 unit PRBC this hospitalization -Anemia panel suggestive of anemia of chronic disease -Hemoglobin currently 8.9 -Continue to monitor CBC  Paroxysmal atrial fibrillation/transient NSVT -Status post DC cardioversion 08/20/2018, currently in sinus rhythm -No anticoagulation given GI bleeding issues -Continue amiodarone, Coreg -Need to follow-up with cardiology, Dr. [name]  Severe protein calorie malnutrition -Currently on TNA and dysphagia 1 diet -speech therapy following  CAD -Complaints of chest pain -Continue Coreg  Thrombocytopenia -recovered and resolved  Hyperglycemia/ prediabetes -Suspect secondary to steroids and tapering. -Continue insulin sliding scale and CBG monitoring -Hemoglobin A1c 5.8  Physical deconditioning -Likely secondary to acute illness, prolonged hospitalization, underlying medical problems -PT and OT recommending SNF  DVT Prophylaxis  SCDs  Code Status: Full  Family Communication: None at bedside.  Disposition Plan: Admitted. Pending improvement in nutritional status and weaning of TPN.  SNF on discharge  Consultants PCCM General surgery Cardiology  Procedures  Cardioversion 08/20/2018 Ex laparotomy with lysis of adhesion, bowel resection for ischemic bowel   Antibiotics   Anti-infectives (From admission, onward)   Start     Dose/Rate Route Frequency Ordered Stop   08/23/18 1400  piperacillin-tazobactam (ZOSYN) IVPB 2.25 g     2.25 g 100 mL/hr over 30 Minutes Intravenous Every 8 hours 08/23/18 0928 08/30/18 2230   08/21/18 1200  piperacillin-tazobactam (ZOSYN) IVPB 3.375 g  Status:  Discontinued     3.375 g 12.5 mL/hr over 240 Minutes Intravenous Every 8 hours 08/21/18 1102 08/23/18 0928   08/14/18 1000  hydroxychloroquine (PLAQUENIL) tablet 200 mg  Status:  Discontinued    Note to Pharmacy:  Take 1 tablet by mouth daily Monday through Friday only     200 mg Oral Once per day on Mon Tue Wed Thu Fri 08/14/18 0742 08/21/18 1147   08/13/18 2200  doxycycline (VIBRA-TABS) tablet 100 mg  Status:  Discontinued     100 mg Oral Every 12 hours 08/13/18 2134 08/13/18 2135   08/13/18 2200  doxycycline (VIBRAMYCIN) 100 mg in sodium chloride 0.9 % 250 mL IVPB  Status:  Discontinued     100 mg 125 mL/hr over 120 Minutes Intravenous Every 12 hours 08/13/18 2135 08/19/18 1702   08/13/18 2145  cefTRIAXone (ROCEPHIN) 1 g in sodium chloride 0.9 % 100 mL IVPB  Status:  Discontinued     1 g 200 mL/hr over 30 Minutes Intravenous Every 24 hours 08/13/18 2134 08/14/18 0743      Subjective:   [name] seen and examined today.  No complaints today.  Feels abdominal pain is improving but continues to have some discomfort.  Denies chest pain, shortness breath, abdominal pain, nausea or vomiting, diarrhea or constipation, dizziness or headache. Objective:   Vitals:   09/06/18 1955 09/06/18 2357 09/07/18 0444 09/07/18 0731  BP: 130/72 (!) 139/57 139/65 (!) 145/62  Pulse: 80 70 77 73  Resp: 17 14 12  16  Temp: 97.6 F (36.4 C) (!) 97.5 F (36.4 C) 97.6 F (36.4 C) (!) 97.5 F (36.4 C)  TempSrc: Oral Oral Axillary Oral  SpO2: 99% 98% 98% 97%  Weight:      Height:        Intake/Output Summary (Last 24 hours) at 09/07/2018 0812 Last data filed at 09/07/2018  0546 Gross per 24 hour  Intake 848.92 ml  Output 1900 ml  Net -1051.08 ml   Filed Weights   09/01/18 0933 09/02/18 0650 09/05/18 0325  Weight: 76.7 kg 73 kg 68.8 kg   Exam  General: Well developed, chronically ill-appearing, NAD  HEENT: NCAT, mucous membranes moist.   Cardiovascular: S1 S2 auscultated, RRR, soft SEM  Respiratory: Clear to auscultation bilaterally  Abdomen: Soft, mildly TTP, nondistended, + bowel sounds, dressing in place  Extremities: warm dry without cyanosis clubbing or edema  Neuro: AAOx3, hard of hearing, otherwise nonfocal  Psych: Normal affect and demeanor with intact judgement and insight  Data Reviewed: I have personally reviewed following labs and imaging studies  CBC: Recent Labs  Lab 09/01/18 0628 09/02/18 0814 09/03/18 0440  09/04/18 0511 09/04/18 0640 09/05/18 0325 09/06/18 0500 09/07/18 0500  WBC 11.2* 9.0 10.1  --  11.8* 9.4  --   --   --   NEUTROABS 8.6*  --   --   --   --   --   --   --   --   HGB 7.3* 7.3* 7.0*   < > 4.5* 7.1* 7.5* 8.1* 8.9*  HCT 22.1* 22.4* 21.7*   < > 14.4* 22.6* 24.1* 25.9* 28.8*  MCV 81.9 82.7 84.8  --  86.2 85.6  --   --   --   PLT 162 193 211  --  304 293  --   --   --    < > = values in this interval not displayed.   Basic Metabolic Panel: Recent Labs  Lab 09/01/18 0628  09/03/18 0440 09/04/18 0511 09/05/18 0325 09/06/18 0500 09/07/18 0500  NA 143   < > 142 144 146* 151* 148*  K 4.3   < > 3.7 3.5 3.1* 3.6 3.7  CL 108   < > 105 101 103 104 103  CO2 23   < > 25 27 30  32 31  GLUCOSE 117*   < > 213* 189* 173* 155* 248*  BUN 95*   < > 138* 153* 156* 160* 157*  CREATININE 2.52*   < > 2.53* 2.51* 2.27* 2.18* 2.08*  CALCIUM 8.4*   < > 8.7* 8.3* 8.1* 8.6* 8.6*  MG 1.8  --   --  1.9  --   --   --   PHOS 3.8  --  4.8* 5.0*  --   --   --    < > = values in this interval not displayed.   GFR: Estimated Creatinine Clearance: 20.9 mL/min (A) (by C-G formula based on SCr of 2.08 mg/dL (H)). Liver  Function Tests: Recent Labs  Lab 09/01/18 0628 09/03/18 0440 09/04/18 0511  AST 47* 52* 60*  ALT 24 34 46*  ALKPHOS 312* 323* 281*  BILITOT 3.1* 2.7* 2.5*  PROT 5.3* 5.5* 5.4*  ALBUMIN 1.2* 1.4* 1.5*   No results for input(s): LIPASE, AMYLASE in the last 168 hours. No results for input(s): AMMONIA in the last 168 hours. Coagulation Profile: No results for input(s): INR, PROTIME in the last 168 hours. Cardiac Enzymes: No results for input(s): CKTOTAL, CKMB,  CKMBINDEX, TROPONINI in the last 168 hours. BNP (last 3 results) No results for input(s): PROBNP in the last 8760 hours. HbA1C: No results for input(s): HGBA1C in the last 72 hours. CBG: Recent Labs  Lab 09/06/18 0601 09/06/18 1208 09/06/18 1919 09/07/18 0017 09/07/18 0620  GLUCAP 173* 300* 339* 187* 259*   Lipid Profile: No results for input(s): CHOL, HDL, LDLCALC, TRIG, CHOLHDL, LDLDIRECT in the last 72 hours. Thyroid Function Tests: No results for input(s): TSH, T4TOTAL, FREET4, T3FREE, THYROIDAB in the last 72 hours. Anemia Panel: No results for input(s): VITAMINB12, FOLATE, FERRITIN, TIBC, IRON, RETICCTPCT in the last 72 hours. Urine analysis:    Component Value Date/Time   COLORURINE AMBER (A) 08/13/2018 1849   APPEARANCEUR CLOUDY (A) 08/13/2018 1849   LABSPEC 1.016 08/13/2018 1849   PHURINE 5.0 08/13/2018 1849   GLUCOSEU NEGATIVE 08/13/2018 1849   HGBUR MODERATE (A) 08/13/2018 1849   BILIRUBINUR NEGATIVE 08/13/2018 1849   KETONESUR NEGATIVE 08/13/2018 1849   PROTEINUR 30 (A) 08/13/2018 1849   UROBILINOGEN 0.2 07/17/2013 2240   NITRITE NEGATIVE 08/13/2018 1849   LEUKOCYTESUR TRACE (A) 08/13/2018 1849   Sepsis Labs: @LABRCNTIP (procalcitonin:4,lacticidven:4)  ) Recent Results (from the past 240 hour(s))  Novel Coronavirus, NAA (hospital order; send-out to ref lab)     Status: None   Collection Time: 09/01/18 10:10 AM  Result Value Ref Range Status   SARS-CoV-2, NAA NOT DETECTED NOT DETECTED Final     Comment: (NOTE) This test was developed and its performance characteristics determined by [employer]. This test has not been FDA cleared or approved. This test has been authorized by FDA under an Emergency Use Authorization (EUA). This test is only authorized for the duration of time the declaration that circumstances exist justifying the authorization of the emergency use of in vitro diagnostic tests for detection of SARS-CoV-2 virus and/or diagnosis of COVID-19 infection under section 564(b)(1) of the Act, 21 U.S.C. 641RAX-0(N)(4), unless the authorization is terminated or revoked sooner. When diagnostic testing is negative, the possibility of a false negative result should be considered in the context of a patient's recent exposures and the presence of clinical signs and symptoms consistent with COVID-19. An individual without symptoms of COVID-19 and who is not shedding SARS-CoV-2 virus would expect to have a negative (not detected) result in this assay. Performed  At: [facility] [address] [place], [place] [id number] Rush Farmer MD SR:[id number]    Electra  Final    Comment: Performed at [facility], [place] [address], [place], [place] [address]      Radiology Studies: No results found.   Scheduled Meds: . amiodarone  200 mg Oral Daily  . carvedilol  3.125 mg Oral BID WC  . chlorhexidine  15 mL Mouth Rinse BID  . feeding supplement (ENSURE ENLIVE)  237 mL Oral BID BM  . furosemide  40 mg Intravenous Q12H  . insulin aspart  0-20 Units Subcutaneous Q6H  . ipratropium-albuterol  3 mL Nebulization BID  . mouth rinse  15 mL Mouth Rinse q12n4p  . methylPREDNISolone (SOLU-MEDROL) injection  40 mg Intravenous Q12H  . mupirocin ointment   Nasal BID  . pantoprazole (PROTONIX) IV  40 mg Intravenous QHS  . saccharomyces boulardii  250 mg Oral BID   Continuous Infusions: . TPN ADULT (ION) 60 mL/hr at 09/06/18 1815      LOS: 25 days   Time Spent in minutes   30 minutes  Metzli Pollick D.O. on 09/07/2018 at 8:12  AM  Between 7am to 7pm - Please see pager noted on [email or web address]  After 7pm go to [email or web address]  And look for the night coverage person covering for me after hours  [facility] Office  [phone]

## 2018-09-07 NOTE — Progress Notes (Signed)
Updated husband by phone of patient's condition.  Patient still on TPN.  Per provider, Dr. Ree Kida, DO, once TPN is discontinued will be a likely candidate for IP Rehab.  Lab values (sodium) are being monitored while on TPN.  Provider Dr. Ree Kida, DO added solumedrol to treatment plan.

## 2018-09-08 LAB — CBC
HCT: 28 % — ABNORMAL LOW (ref 36.0–46.0)
Hemoglobin: 8.5 g/dL — ABNORMAL LOW (ref 12.0–15.0)
MCH: 27.2 pg (ref 26.0–34.0)
MCHC: 30.4 g/dL (ref 30.0–36.0)
MCV: 89.7 fL (ref 80.0–100.0)
Platelets: 285 10*3/uL (ref 150–400)
RBC: 3.12 MIL/uL — ABNORMAL LOW (ref 3.87–5.11)
RDW: 21.3 % — ABNORMAL HIGH (ref 11.5–15.5)
WBC: 17.2 10*3/uL — ABNORMAL HIGH (ref 4.0–10.5)
nRBC: 0.7 % — ABNORMAL HIGH (ref 0.0–0.2)

## 2018-09-08 LAB — DIFFERENTIAL
Abs Immature Granulocytes: 0.8 10*3/uL — ABNORMAL HIGH (ref 0.00–0.07)
Basophils Absolute: 0 10*3/uL (ref 0.0–0.1)
Basophils Relative: 0 %
Eosinophils Absolute: 0 10*3/uL (ref 0.0–0.5)
Eosinophils Relative: 0 %
Immature Granulocytes: 5 %
Lymphocytes Relative: 10 %
Lymphs Abs: 1.7 10*3/uL (ref 0.7–4.0)
Monocytes Absolute: 1.2 10*3/uL — ABNORMAL HIGH (ref 0.1–1.0)
Monocytes Relative: 7 %
Neutro Abs: 13.4 10*3/uL — ABNORMAL HIGH (ref 1.7–7.7)
Neutrophils Relative %: 78 %

## 2018-09-08 LAB — COMPREHENSIVE METABOLIC PANEL
ALT: 66 U/L — ABNORMAL HIGH (ref 0–44)
AST: 49 U/L — ABNORMAL HIGH (ref 15–41)
Albumin: 1.8 g/dL — ABNORMAL LOW (ref 3.5–5.0)
Alkaline Phosphatase: 174 U/L — ABNORMAL HIGH (ref 38–126)
Anion gap: 14 (ref 5–15)
BUN: 161 mg/dL — ABNORMAL HIGH (ref 8–23)
CO2: 33 mmol/L — ABNORMAL HIGH (ref 22–32)
Calcium: 8.6 mg/dL — ABNORMAL LOW (ref 8.9–10.3)
Chloride: 105 mmol/L (ref 98–111)
Creatinine, Ser: 2.04 mg/dL — ABNORMAL HIGH (ref 0.44–1.00)
GFR calc Af Amer: 26 mL/min — ABNORMAL LOW (ref >60)
GFR calc non Af Amer: 23 mL/min — ABNORMAL LOW (ref >60)
Glucose, Bld: 99 mg/dL (ref 70–99)
Potassium: 3.8 mmol/L (ref 3.5–5.1)
Sodium: 152 mmol/L — ABNORMAL HIGH (ref 135–145)
Total Bilirubin: 3.6 mg/dL — ABNORMAL HIGH (ref 0.3–1.2)
Total Protein: 5.8 g/dL — ABNORMAL LOW (ref 6.5–8.1)

## 2018-09-08 LAB — GLUCOSE, CAPILLARY
Glucose-Capillary: 158 mg/dL — ABNORMAL HIGH (ref 70–99)
Glucose-Capillary: 183 mg/dL — ABNORMAL HIGH (ref 70–99)
Glucose-Capillary: 186 mg/dL — ABNORMAL HIGH (ref 70–99)
Glucose-Capillary: 263 mg/dL — ABNORMAL HIGH (ref 70–99)
Glucose-Capillary: 418 mg/dL — ABNORMAL HIGH (ref 70–99)
Glucose-Capillary: 461 mg/dL — ABNORMAL HIGH (ref 70–99)

## 2018-09-08 LAB — MAGNESIUM: Magnesium: 2.1 mg/dL (ref 1.7–2.4)

## 2018-09-08 LAB — PREALBUMIN: Prealbumin: 33.2 mg/dL (ref 18–38)

## 2018-09-08 LAB — PHOSPHORUS: Phosphorus: 6 mg/dL — ABNORMAL HIGH (ref 2.5–4.6)

## 2018-09-08 LAB — TRIGLYCERIDES: Triglycerides: 118 mg/dL (ref <150)

## 2018-09-08 MED ORDER — TRACE MINERALS CR-CU-MN-SE-ZN 10-1000-500-60 MCG/ML IV SOLN
INTRAVENOUS | Status: AC
  Administered 2018-09-08: 18:00:00 via INTRAVENOUS
  Filled 2018-09-08: qty 499.2

## 2018-09-08 NOTE — Progress Notes (Signed)
Notified MD CBG >418.  Give 20 and check Q1 hour to ensure no hypoglycemia

## 2018-09-08 NOTE — Progress Notes (Signed)
  Speech Language Pathology Treatment: Dysphagia  Patient Details Name: [name] MRN: [id number] DOB: Jun 26, 1940 Today's Date: 09/08/2018 Time: 1740-8144 SLP Time Calculation (min) (ACUTE ONLY): 15 min  Assessment / Plan / Recommendation Clinical Impression  Patient was seen to address dysphagia goals with current diet of Dys 1 and thin liquids. NT in room when SLP arrived and she said that recently, patient has been eating about 50% of breakfast meals, but today, she only ate magic cup supplement and did not eat any other PO's on meal tray. NT also stated that patient doesn't seem like herself today. Patient consumed 3 bites of ice cream and two straw sips of thin liquids. Although she did not exhibit any overt s/s of aspiration or penetration, she did grimace a couple times and refused any additional PO's. Patient was not as animated or talkative today and after SLP completed oral care, patient then started to close her eyes.    HPI HPI: Patient is a 78 y.o. female with PMH: chronic CHF, CAD with remote MI, HTN, hyperlipidemia, CKD stage III, chronic anemia, GERD, who underwent ex-lap for SBO on 4/30. She was transferred to ICU post-op for severe hypotension and shock, was intubated from 4/30 to 5/3.      SLP Plan  Continue with current plan of care       Recommendations  Diet recommendations: Thin liquid;Dysphagia 1 (puree) Liquids provided via: Cup;Straw Medication Administration: Crushed with puree Supervision: Full supervision/cueing for compensatory strategies;Trained caregiver to feed patient Compensations: Minimize environmental distractions;Slow rate;Small sips/bites Postural Changes and/or Swallow Maneuvers: Seated upright 90 degrees;Upright 30-60 min after meal                Oral Care Recommendations: Oral care BID Follow up Recommendations: Skilled Nursing facility SLP Visit Diagnosis: Dysphagia, oral phase (R13.11) Plan: Continue with current plan of  care       GO                [name] 09/08/2018, 4:06 PM    [name], MA, [facility] Pager: [phone]

## 2018-09-08 NOTE — Progress Notes (Signed)
Patient ID: [name], female   DOB: 08-06-1940, 78 y.o.   MRN: [id number]    18 Days Post-Op  Subjective: No complaints.  Ate magical cup for breakfast, otherwise HOH and doesn't communicate well due to lack of understanding.    Objective: Vital signs in last 24 hours: Temp:  [97.1 F (36.2 C)-98.1 F (36.7 C)] 97.1 F (36.2 C) (05/18 0500) Pulse Rate:  [72-84] 83 (05/18 0856) Resp:  [12-18] 16 (05/18 0500) BP: (116-140)/(47-65) 116/53 (05/18 0856) SpO2:  [94 %-100 %] 94 % (05/18 0856) Last BM Date: 09/05/18  Intake/Output from previous day: 05/17 0701 - 05/18 0700 In: 1915.9 [P.O.:440; I.V.:1475.9] Out: 2701 [Urine:2700; Stool:1] Intake/Output this shift: Total I/O In: -  Out: 800 [Urine:800]  PE: Abd: soft, seems nontender, nondistended, +BS, midline wound with dehiscence and fibrin at the base covering about 80% of the wound     Lab Results:  Recent Labs    09/07/18 0500 09/08/18 0500  WBC  --  17.2*  HGB 8.9* 8.5*  HCT 28.8* 28.0*  PLT  --  285   BMET Recent Labs    09/07/18 0500 09/08/18 0500  NA 148* 152*  K 3.7 3.8  CL 103 105  CO2 31 33*  GLUCOSE 248* 99  BUN 157* 161*  CREATININE 2.08* 2.04*  CALCIUM 8.6* 8.6*   PT/INR No results for input(s): LABPROT, INR in the last 72 hours. CMP     Component Value Date/Time   NA 152 (H) 09/08/2018 0500   K 3.8 09/08/2018 0500   CL 105 09/08/2018 0500   CO2 33 (H) 09/08/2018 0500   GLUCOSE 99 09/08/2018 0500   BUN 161 (H) 09/08/2018 0500   CREATININE 2.04 (H) 09/08/2018 0500   CREATININE 1.07 (H) 02/13/2018 1142   CALCIUM 8.6 (L) 09/08/2018 0500   PROT 5.8 (L) 09/08/2018 0500   ALBUMIN 1.8 (L) 09/08/2018 0500   AST 49 (H) 09/08/2018 0500   ALT 66 (H) 09/08/2018 0500   ALKPHOS 174 (H) 09/08/2018 0500   BILITOT 3.6 (H) 09/08/2018 0500   GFRNONAA 23 (L) 09/08/2018 0500   GFRNONAA 49 (L) 02/13/2018 1142   GFRAA 26 (L) 09/08/2018 0500   GFRAA 57 (L) 02/13/2018 1142   Lipase     Component  Value Date/Time   LIPASE 30 08/13/2018 1926       Studies/Results: No results found.  Anti-infectives: Anti-infectives (From admission, onward)   Start     Dose/Rate Route Frequency Ordered Stop   08/23/18 1400  piperacillin-tazobactam (ZOSYN) IVPB 2.25 g     2.25 g 100 mL/hr over 30 Minutes Intravenous Every 8 hours 08/23/18 0928 08/30/18 2230   08/21/18 1200  piperacillin-tazobactam (ZOSYN) IVPB 3.375 g  Status:  Discontinued     3.375 g 12.5 mL/hr over 240 Minutes Intravenous Every 8 hours 08/21/18 1102 08/23/18 0928   08/14/18 1000  hydroxychloroquine (PLAQUENIL) tablet 200 mg  Status:  Discontinued    Note to Pharmacy:  Take 1 tablet by mouth daily Monday through Friday only     200 mg Oral Once per day on Mon Tue Wed Thu Fri 08/14/18 0742 08/21/18 1147   08/13/18 2200  doxycycline (VIBRA-TABS) tablet 100 mg  Status:  Discontinued     100 mg Oral Every 12 hours 08/13/18 2134 08/13/18 2135   08/13/18 2200  doxycycline (VIBRAMYCIN) 100 mg in sodium chloride 0.9 % 250 mL IVPB  Status:  Discontinued     100 mg 125 mL/hr over 120  Minutes Intravenous Every 12 hours 08/13/18 2135 08/19/18 1702   08/13/18 2145  cefTRIAXone (ROCEPHIN) 1 g in sodium chloride 0.9 % 100 mL IVPB  Status:  Discontinued     1 g 200 mL/hr over 30 Minutes Intravenous Every 24 hours 08/13/18 2134 08/14/18 0743       Assessment/Plan POD18, s/p ex lapw/SBR, LOA, appendectomyfor SBO&ischemic/necroticbowel, 4/30 - PT - prealbumin somehow 33.2 this am from 8.6 despite the patient not eating much at all and just on TNA.  Wonder if this is false.  Likely repeat this to verify. -cont diet and supplementation.  Wean TNA as oral intake improves -JPremoved -completed zosyn x10 days, patient afebrile - Mobilize and IS -wound remains dehisced, but with no evisceration.  Cont dressing changes.  Acute on chronic renal failure- Cr 2.04  ICD/CAD/CHF-per cardiology,on amio (oral)  Acute Respiratory  Failure- resolved.   ABL Anemia-hgb stable  FEN -TPN,DYS1 (AAT per speech) VTE - SCDs, Mobilize as tolerating with PT. ID -zosyn 4/30>>5/9.WBCup to 17, AF, will repeat in am and follow Foley - Removed 5/7 Follow up - Dr. Marlou Starks    LOS: 26 days    [name] , [facility] 09/08/2018, 9:19 AM Pager: [phone]

## 2018-09-08 NOTE — Progress Notes (Addendum)
Physical Therapy Treatment Patient Details Name: [name] MRN: [id number] DOB: 1940/09/26 Today's Date: 09/08/2018    History of Present Illness 78 year old female with significant cardiac history that is post exploratory laparotomy for small bowel obstruction with associated ischemic bowel on 4/30, return to the intensive care on mechanical ventilator, critical care asked to assist with care.  VDRF 4/30-5/3.  Also PNA.  VQQ:[id number] heart failure with EF 30%, nonsustained VT, pericardial effusion CAD w/ prior AICD, diastolic heart failure, CKD stage III, hypertension, rheumatoid arthritis, anemia of chronic disease.    PT Comments    Patient progressing slowly towards PT goals. Pt reports not feeling well today and dry heaving once sitting EOB. Tolerated standing trials x2 and able to take a few steps to get to chair with Min-Mod A of 2. Noted to have BLE weakness and fatigues quickly. Very HOH so difficult to assess cognition. Of note, goals updated, Continues to be appropriate for SNF. Will follow.    Follow Up Recommendations  SNF;Supervision/Assistance - 24 hour     Equipment Recommendations  None recommended by PT    Recommendations for Other Services       Precautions / Restrictions Precautions Precautions: Fall Restrictions Weight Bearing Restrictions: No    Mobility  Bed Mobility Overal bed mobility: Needs Assistance Bed Mobility: Supine to Sit     Supine to sit: Mod assist;HOB elevated     General bed mobility comments: Able to initiate movement of BLEs to EOB,. assist with trunk and scooting hips to EOB.  Transfers Overall transfer level: Needs assistance Equipment used: Rolling walker (2 wheeled) Transfers: Sit to/from [facility] Sit to Stand: Mod assist;+2 physical assistance;+2 safety/equipment Stand pivot transfers: Min assist;+2 safety/equipment       General transfer comment: Assist of 2 to power to standing using RW, upon  rising BLEs gave way with pt landing on bed. Stood from EOB x2; able to take a few steps to get to chair with Min A for balance.   Ambulation/Gait             General Gait Details: Declined further walking.   Stairs             Wheelchair Mobility    Modified Rankin (Stroke Patients Only)       Balance Overall balance assessment: Needs assistance Sitting-balance support: Bilateral upper extremity supported;Feet supported Sitting balance-Leahy Scale: Fair Sitting balance - Comments: able to sit EOB with min guard assist    Standing balance support: Bilateral upper extremity supported Standing balance-Leahy Scale: Poor Standing balance comment: Pt reliant on bil. UE support and external support at times.                             Cognition Arousal/Alertness: Awake/alert Behavior During Therapy: WFL for tasks assessed/performed Overall Cognitive Status: Difficult to assess                                 General Comments: Pt is extremely HOH, only able to hear minimal info if you talk loudly in her right ear.  She indicates she reads lips, but with masks in place this makes it very difficult for her.  She follows gestural commands well.       Exercises      General Comments General comments (skin integrity, edema, etc.): VSS throughout; pt dry heaving once sitting  EOB.      Pertinent Vitals/Pain Pain Assessment: Faces Faces Pain Scale: No hurt    Home Living                      Prior Function            PT Goals (current goals can now be found in the care plan section) Progress towards PT goals: Progressing toward goals(slowly)    Frequency    Min 2X/week      PT Plan Current plan remains appropriate    Co-evaluation              AM-PAC PT "6 Clicks" Mobility   Outcome Measure  Help needed turning from your back to your side while in a flat bed without using bedrails?: A Little Help needed  moving from lying on your back to sitting on the side of a flat bed without using bedrails?: A Lot Help needed moving to and from a bed to a chair (including a wheelchair)?: A Lot Help needed standing up from a chair using your arms (e.g., wheelchair or bedside chair)?: A Lot Help needed to walk in hospital room?: A Lot Help needed climbing 3-5 steps with a railing? : Total 6 Click Score: 12    End of Session Equipment Utilized During Treatment: Gait belt Activity Tolerance: Patient limited by fatigue;Other (comment)(dry heaving, not feeling well) Patient left: in chair;with call bell/phone within reach;with chair alarm set Nurse Communication: Mobility status PT Visit Diagnosis: Unsteadiness on feet (R26.81);Muscle weakness (generalized) (M62.81)     Time: 5498-2641 PT Time Calculation (min) (ACUTE ONLY): 19 min  Charges:  $Therapeutic Activity: 8-22 mins                     Wray Kearns, PT, DPT Acute Rehabilitation Services Pager [phone] Office [place] 09/08/2018, 1:04 PM

## 2018-09-08 NOTE — Progress Notes (Signed)
PROGRESS NOTE    [name]  TDD:[id number] DOB: 1940/07/08 DOA: 08/13/2018 PCP: [name], MD   Brief Narrative:  HPI on 08/13/2018 by Dr. Cherylann Parr is a 78 y.o. female with medical history significant of anemia chronic disease, ASCVD, AICD placement, history of breast cancer, chronic bronchitis, stage III chronic renal disease, systolic heart failure, hyperlipidemia, GERD, history of GI bleed, gout, hypertension, rheumatoid arthritis, impaired hearing, history of UTI who is coming to the emergency department with complaints of abdominal pain apparently for the past 3 days associated with decreased appetite, several episodes of diarrhea, nausea, but no emesis.  She denies fever, cough, sore throat, chest pain, dysuria, frequency, hematuria, melena or hematochezia.  She denies polyuria, polydipsia, polyphagia or blurred vision.  History is limited by the patient severe hearing impairment.  Interim history Patient was admitted with small bowel obstruction, failed conservative management and was transferred to [name].  General surgery consulted and appreciated, patient underwent ex lap with lysis of adhesions, bowel resection for ischemic bowel.  She continued on the ventilator was put in ICU as she was also hypotensive and started on pressors.  Patient was also shocked for VT.  She developed progressive renal failure, oliguria.  She was weaned off of pressors and extubated. And TR[facility] assumed care. Started on antibiotics for possible pneumonia.  Patient now with prolonged postop ileus, ongoing TNA, diet gradually being advanced per speech therapy.  She was also noted to have a self-limiting GI bleed.  Patient had acute respiratory distress on 09/01/2018 with decompensated CHF and upper airway wheezing.  PCCM consulted.  Assessment & Plan   Acute respiratory failure with hypoxia -Noted on 09/01/2018.  Suspected to be due to decompensated CHF and a  component of bronchospasm. -PCCM consulted, patient was briefly placed on BiPAP for 3 to 4 hours, given IV Lasix and Solu-Medrol -Has now improved, continuing IV Lasix.  Tapering steroids at this time- Continue solumedrol to 40mg  IV daily.  Continuing Bronchodilators and BiPAP as needed  Septic shock -Resolved and stable -Status post VDRF and pressure support.  Patient extubated on 08/24/2018.  Weaned off pressors on 08/23/2018  Small bowel obstruction, ischemic colon /Postop ileus -Patient failed conservative management of bowel obstruction at [facility] -General surgery consulted and appreciated -Status post expiratory laparotomy for SBO and small bowel section of ischemic necrotic bowel -Completed 10 days of IV Zosyn -NG tube was discontinued -Wound VAC removed on 08/27/2018.  Continue wet to dry dressing changes -Currently on dysphagia 1 diet and tolerating -Noted to have diarrhea on 08/28/2018, Flexi-Seal was placed.  Low index of suspicion for C. difficile in the absence of abdominal pain or leukocytosis.  Diarrhea has now resolved and Flexi-Seal discontinued -patient having daily bowel movements -Continue TNA until patient is able to increase intake  Leukocytosis -WBC up to 17.2 -Currently afebrile -may be secondary to steroids -will continue to monitor CBC  Diarrhea -As above, Has now resolved. -Suspect related to resolving postoperative ileus  Acute lower GI bleed -As per surgery, felt to be due to staple line -Heparin was discontinued, currently on SCDs for DVT prophylaxis -GI bleed has resolved -Hemoglobin currently 8.5 -Continue to monitor CBC  Acute kidney injury on chronic kidney disease, stage III -Baseline creatinine approximately 1.5-1.8, peaked to 3.2 in the setting of septic shock, ATN and ischemic bowel -Creatinine currently 2.04- patient may have a new baseline -Continue to monitor BMP  Hypophosphatemia/hypomagnesemia -Replaced, will continue to monitor  and replace as  needed  Hypernatremia -secondary to TPN -pharmacy to adjust saline content -sodium back up to 152 -continue to monitor BMP  Non-anion gap metabolic acidosis -Due to diarrhea and mild hyperchloremia -Resolved with sodium bicarbonate  Acute on chronic systolic CHF -Echocardiogram 08/19/2018 shows an EF of 30%.  Inferior basal and inferoseptal akinesis.  Systolic function indeterminate. -Was off of diuretics, ACE inhibitor in the setting of acute kidney injury and septic shock -Patient developed progressive dyspnea and wheezing on 08/31/2018.  Thought to be due to fluids as well as TNA -Patient was given IV Lasix on 09/01/2018 with good urine output -Continue to monitor intake and output, daily weights -UOP 2700cc in the past 24hrs  Acute on chronic anemia -Patient did receive 1 unit PRBC this hospitalization -Anemia panel suggestive of anemia of chronic disease -Hemoglobin currently 8.9 -Continue to monitor CBC  Paroxysmal atrial fibrillation/transient NSVT -Status post DC cardioversion 08/20/2018, currently in sinus rhythm -No anticoagulation given GI bleeding issues -Continue amiodarone, Coreg -She will need to follow-up with cardiology, Dr. [name]  Severe protein calorie malnutrition -Currently on TNA and dysphagia 1 diet -speech therapy following  CAD -Complaints of chest pain -Continue Coreg  Thrombocytopenia -recovered and resolved  Hyperglycemia/ prediabetes -Suspect secondary to steroids and tapering. -Continue insulin sliding scale and CBG monitoring -Hemoglobin A1c 5.8  Physical deconditioning -Likely secondary to acute illness, prolonged hospitalization, underlying medical problems -PT and OT recommending SNF  DVT Prophylaxis  SCDs  Code Status: Full  Family Communication: None at bedside. Attempted to reach husband- no voicemail available.  Disposition Plan: Admitted. Pending improvement in nutritional status and weaning of TPN.  SNF on discharge   Consultants PCCM General surgery Cardiology  Procedures  Cardioversion 08/20/2018 Ex laparotomy with lysis of adhesion, bowel resection for ischemic bowel  Antibiotics   Anti-infectives (From admission, onward)   Start     Dose/Rate Route Frequency Ordered Stop   08/23/18 1400  piperacillin-tazobactam (ZOSYN) IVPB 2.25 g     2.25 g 100 mL/hr over 30 Minutes Intravenous Every 8 hours 08/23/18 0928 08/30/18 2230   08/21/18 1200  piperacillin-tazobactam (ZOSYN) IVPB 3.375 g  Status:  Discontinued     3.375 g 12.5 mL/hr over 240 Minutes Intravenous Every 8 hours 08/21/18 1102 08/23/18 0928   08/14/18 1000  hydroxychloroquine (PLAQUENIL) tablet 200 mg  Status:  Discontinued    Note to Pharmacy:  Take 1 tablet by mouth daily Monday through Friday only     200 mg Oral Once per day on Mon Tue Wed Thu Fri 08/14/18 0742 08/21/18 1147   08/13/18 2200  doxycycline (VIBRA-TABS) tablet 100 mg  Status:  Discontinued     100 mg Oral Every 12 hours 08/13/18 2134 08/13/18 2135   08/13/18 2200  doxycycline (VIBRAMYCIN) 100 mg in sodium chloride 0.9 % 250 mL IVPB  Status:  Discontinued     100 mg 125 mL/hr over 120 Minutes Intravenous Every 12 hours 08/13/18 2135 08/19/18 1702   08/13/18 2145  cefTRIAXone (ROCEPHIN) 1 g in sodium chloride 0.9 % 100 mL IVPB  Status:  Discontinued     1 g 200 mL/hr over 30 Minutes Intravenous Every 24 hours 08/13/18 2134 08/14/18 0743      Subjective:   Cathlean Cower seen and examined today.  No complaints today. Denies chest pain, shortness of breath, nausea, vomiting, constipation, diarrhea, dizziness, headache.  Feels abdominal pain has mildly improved.  Objective:   Vitals:   09/08/18 0036 09/08/18 0456 09/08/18 0500  09/08/18 0743  BP: 134/65 (!) 120/47 (!) 120/47   Pulse: 84 77 80   Resp: 13 13 16    Temp: 97.7 F (36.5 C) 97.7 F (36.5 C) (!) 97.1 F (36.2 C)   TempSrc: Oral Axillary Axillary   SpO2: 96% 95% 96% 100%  Weight:      Height:         Intake/Output Summary (Last 24 hours) at 09/08/2018 0750 Last data filed at 09/08/2018 6578 Gross per 24 hour  Intake 1915.91 ml  Output 3501 ml  Net -1585.09 ml   Filed Weights   09/01/18 0933 09/02/18 0650 09/05/18 0325  Weight: 76.7 kg 73 kg 68.8 kg   Exam  General: Well developed, thin, chronically ill appearing, NAD  HEENT: NCAT, mucous membranes moist.   Cardiovascular: S1 S2 auscultated, soft SEM, RRR  Respiratory: Clear to auscultation bilaterally with equal chest rise  Abdomen: Soft, mildly TTP, nondistended, + bowel sounds, dressing in place  Extremities: warm dry without cyanosis clubbing or edema  Neuro: AAOx3, hard of hearing, otherwise nonfocal  Psych: Pleasant, appropriate mood and affect  Data Reviewed: I have personally reviewed following labs and imaging studies  CBC: Recent Labs  Lab 09/02/18 0814 09/03/18 0440  09/04/18 0511 09/04/18 0640 09/05/18 0325 09/06/18 0500 09/07/18 0500 09/08/18 0500  WBC 9.0 10.1  --  11.8* 9.4  --   --   --  17.2*  NEUTROABS  --   --   --   --   --   --   --   --  13.4*  HGB 7.3* 7.0*   < > 4.5* 7.1* 7.5* 8.1* 8.9* 8.5*  HCT 22.4* 21.7*   < > 14.4* 22.6* 24.1* 25.9* 28.8* 28.0*  MCV 82.7 84.8  --  86.2 85.6  --   --   --  89.7  PLT 193 211  --  304 293  --   --   --  285   < > = values in this interval not displayed.   Basic Metabolic Panel: Recent Labs  Lab 09/03/18 0440 09/04/18 0511 09/05/18 0325 09/06/18 0500 09/07/18 0500 09/08/18 0500  NA 142 144 146* 151* 148* 152*  K 3.7 3.5 3.1* 3.6 3.7 3.8  CL 105 101 103 104 103 105  CO2 25 27 30  32 31 33*  GLUCOSE 213* 189* 173* 155* 248* 99  BUN 138* 153* 156* 160* 157* 161*  CREATININE 2.53* 2.51* 2.27* 2.18* 2.08* 2.04*  CALCIUM 8.7* 8.3* 8.1* 8.6* 8.6* 8.6*  MG  --  1.9  --   --   --  2.1  PHOS 4.8* 5.0*  --   --   --  6.0*   GFR: Estimated Creatinine Clearance: 21.3 mL/min (A) (by C-G formula based on SCr of 2.04 mg/dL (H)). Liver Function Tests:  Recent Labs  Lab 09/03/18 0440 09/04/18 0511 09/08/18 0500  AST 52* 60* 49*  ALT 34 46* 66*  ALKPHOS 323* 281* 174*  BILITOT 2.7* 2.5* 3.6*  PROT 5.5* 5.4* 5.8*  ALBUMIN 1.4* 1.5* 1.8*   No results for input(s): LIPASE, AMYLASE in the last 168 hours. No results for input(s): AMMONIA in the last 168 hours. Coagulation Profile: No results for input(s): INR, PROTIME in the last 168 hours. Cardiac Enzymes: No results for input(s): CKTOTAL, CKMB, CKMBINDEX, TROPONINI in the last 168 hours. BNP (last 3 results) No results for input(s): PROBNP in the last 8760 hours. HbA1C: No results for input(s): HGBA1C in the last 72  hours. CBG: Recent Labs  Lab 09/07/18 0017 09/07/18 0620 09/07/18 1146 09/07/18 1742 09/08/18 0038  GLUCAP 187* 259* 309* 178* 183*   Lipid Profile: Recent Labs    09/08/18 0500  TRIG 118   Thyroid Function Tests: No results for input(s): TSH, T4TOTAL, FREET4, T3FREE, THYROIDAB in the last 72 hours. Anemia Panel: No results for input(s): VITAMINB12, FOLATE, FERRITIN, TIBC, IRON, RETICCTPCT in the last 72 hours. Urine analysis:    Component Value Date/Time   COLORURINE AMBER (A) 08/13/2018 1849   APPEARANCEUR CLOUDY (A) 08/13/2018 1849   LABSPEC 1.016 08/13/2018 1849   PHURINE 5.0 08/13/2018 1849   GLUCOSEU NEGATIVE 08/13/2018 1849   HGBUR MODERATE (A) 08/13/2018 1849   BILIRUBINUR NEGATIVE 08/13/2018 1849   KETONESUR NEGATIVE 08/13/2018 1849   PROTEINUR 30 (A) 08/13/2018 1849   UROBILINOGEN 0.2 07/17/2013 2240   NITRITE NEGATIVE 08/13/2018 1849   LEUKOCYTESUR TRACE (A) 08/13/2018 1849   Sepsis Labs: @LABRCNTIP (procalcitonin:4,lacticidven:4)  ) Recent Results (from the past 240 hour(s))  Novel Coronavirus, NAA (hospital order; send-out to ref lab)     Status: None   Collection Time: 09/01/18 10:10 AM  Result Value Ref Range Status   SARS-CoV-2, NAA NOT DETECTED NOT DETECTED Final    Comment: (NOTE) This test was developed and its  performance characteristics determined by [employer]. This test has not been FDA cleared or approved. This test has been authorized by FDA under an Emergency Use Authorization (EUA). This test is only authorized for the duration of time the declaration that circumstances exist justifying the authorization of the emergency use of in vitro diagnostic tests for detection of SARS-CoV-2 virus and/or diagnosis of COVID-19 infection under section 564(b)(1) of the Act, 21 U.S.C. 638VFI-4(P)(3), unless the authorization is terminated or revoked sooner. When diagnostic testing is negative, the possibility of a false negative result should be considered in the context of a patient's recent exposures and the presence of clinical signs and symptoms consistent with COVID-19. An individual without symptoms of COVID-19 and who is not shedding SARS-CoV-2 virus would expect to have a negative (not detected) result in this assay. Performed  At: [facility] [address] [place], [place] [id number] Rush Farmer MD SA:[id number]    [place]  Final    Comment: Performed at [facility], [place] [address], [place], [place] [address]      Radiology Studies: No results found.   Scheduled Meds: . amiodarone  200 mg Oral Daily  . carvedilol  3.125 mg Oral BID WC  . chlorhexidine  15 mL Mouth Rinse BID  . feeding supplement (ENSURE ENLIVE)  237 mL Oral BID BM  . furosemide  40 mg Intravenous Q12H  . insulin aspart  0-20 Units Subcutaneous Q6H  . mouth rinse  15 mL Mouth Rinse q12n4p  . methylPREDNISolone (SOLU-MEDROL) injection  40 mg Intravenous Daily  . mupirocin ointment   Nasal BID  . pantoprazole (PROTONIX) IV  40 mg Intravenous QHS  . saccharomyces boulardii  250 mg Oral BID   Continuous Infusions: . TPN ADULT (ION) 60 mL/hr at 09/07/18 1749     LOS: 26 days   Time Spent in minutes   30 minutes  Captola Teschner D.O. on 09/08/2018 at 7:50 AM   Between 7am to 7pm - Please see pager noted on [email or web address]  After 7pm go to [email or web address]  And look for the night coverage person covering for me after hours  [facility] Office  [phone]

## 2018-09-08 NOTE — Progress Notes (Signed)
PHARMACY - ADULT TOTAL PARENTERAL NUTRITION CONSULT NOTE   Pharmacy Consult for TPN Indication: s/p ex lap with SBR for SBO and ischemic/necroticbowel  Patient Measurements: Height: '5\' 4"'  (162.6 cm) Weight: 151 lb 10.8 oz (68.8 kg) IBW/kg (Calculated) : 54.7 TPN AdjBW (KG): 64.8 Body mass index is 26.04 kg/m.  Assessment:  78 year old female with PMH significant for anemia, CAD s/p AICD placement, breast cancer, CKD III, CHF, HLD, impaired hearing, and RA. She presented to [facility] on 4/23 with CAP and SBO. CT on 4/28 show persistent SBO and patient was transferred to [facility] for surgery. On 4/30, an ex lap, lysis of adhesions and small bowel resection was performed. As of 5/4, Patient has been ~12 days without nutrition and pharmacy was consulted 5/4 for TPN with plan for continued bowel rest and NG until bowel function returns.  GI: NGT removed 5/5, JP drain none documented. PPI IV. Pre-albumin up 8.6 >> 33.2 (?, repeating). No N/V. +Flatus. LBM 5/15 (flexiseal removed). Dyphagia 1 diet initiated 5/9. Endo: No history of diabetes. CBGs 99-183 -on steroids (being slowly weaned). Insulin requirements in the past 24 hours: 27 units of moderate SSI + 20 units of insulin in TPN. Lytes: Na up 152; K 3.8; Phos elevated at 6, Mg 2.1.  Renal: Scr 2.04 (trending down), off CRRT - no plan for dialysis. BUN 161. UOP 1.6 mL/kg/hr.  Net -3.9L.  Pulm: Extubated on 5/3. Required BiPAP 5/12, now back on 2L [place]. Steroids weaning but CBGs still elevatyed + Lasix. Cards: Amiodarone po (s/p VT arrest earlier in admission); V-paced at 70, BP ok. Hepatobil: AST down to 49, ALT up 66. Tbili up 3.6. Alk Phos down to 174. TG 118. Neuro: oriented to person only, complains of abdominal pain ID: WBC up to 17.2, Low temp of 97.1 recorded otherwise afebrile, off Zosyn.   TPN Access: CVC triple lumen placed 4/30 TPN start date: 5/4 Nutritional Goals (per RD recommendation on 5/11): Kcal: 2000-2200 kcal Protein:  100-120  grams (decreased to 75 gms or 1 gm/kg due to rising BUN) Fluid:  >/= 2 L/day   Goal TPN rate is 60 ml/hr (provides 75 g of protein, 288 g of dextrose, and 60 g of lipids which provides 2002 kCals per day, meeting 100% of patient needs) -- reduced protein to 1 gm/kg due to rising BUN and concentrated to reduce volume.   Current Nutrition:  Dysphagia 1, puree solids diet + Magic cup - charted 10-25% of 2 meals yesterday; 50?% of breakfast (100% magic cup and few bits of pancakes and sausage) TPN at goal - per Surgery - keep full TPN at least until Monday (5/18) to review prealbumin  Plan:  Continue TPN at 60 mL/hr  This TPN provides  75g of protein, 288g of dextrose, and 60g of lipids which provides 2002 kCals per day, meeting 100% of patient's protein and calorie needs.  Electrolytes in TPN: Remove Na, Phos, and Ca. Continue current K, maximize Cl (gives 1:1.1 ratio based on low lytes in TPN) Add MVI to TPN - trace elements are on back order, will only place in TPN on Monday, Wednesday and Friday Reduce to 10 units of insulin in TPN - trending down with steroid wean Continue resistant SSI  Monitor TPN labs Follow-up diet/intake amounts and ability to wean TPN  Sloan Leiter, PharmD, BCPS, BCCCP Clinical Pharmacist Please check AMION for all [place] numbers 09/08/2018 7:39 AM

## 2018-09-09 ENCOUNTER — Inpatient Hospital Stay (HOSPITAL_COMMUNITY): Payer: Medicare Other

## 2018-09-09 LAB — CBC
HCT: 27.5 % — ABNORMAL LOW (ref 36.0–46.0)
Hemoglobin: 8.5 g/dL — ABNORMAL LOW (ref 12.0–15.0)
MCH: 28 pg (ref 26.0–34.0)
MCHC: 30.9 g/dL (ref 30.0–36.0)
MCV: 90.5 fL (ref 80.0–100.0)
Platelets: 250 10*3/uL (ref 150–400)
RBC: 3.04 MIL/uL — ABNORMAL LOW (ref 3.87–5.11)
RDW: 21.4 % — ABNORMAL HIGH (ref 11.5–15.5)
WBC: 26.7 10*3/uL — ABNORMAL HIGH (ref 4.0–10.5)
nRBC: 0.3 % — ABNORMAL HIGH (ref 0.0–0.2)

## 2018-09-09 LAB — BASIC METABOLIC PANEL
Anion gap: 14 (ref 5–15)
BUN: 168 mg/dL — ABNORMAL HIGH (ref 8–23)
CO2: 31 mmol/L (ref 22–32)
Calcium: 8.5 mg/dL — ABNORMAL LOW (ref 8.9–10.3)
Chloride: 107 mmol/L (ref 98–111)
Creatinine, Ser: 2.01 mg/dL — ABNORMAL HIGH (ref 0.44–1.00)
GFR calc Af Amer: 27 mL/min — ABNORMAL LOW (ref >60)
GFR calc non Af Amer: 23 mL/min — ABNORMAL LOW (ref >60)
Glucose, Bld: 197 mg/dL — ABNORMAL HIGH (ref 70–99)
Potassium: 4.4 mmol/L (ref 3.5–5.1)
Sodium: 152 mmol/L — ABNORMAL HIGH (ref 135–145)

## 2018-09-09 LAB — URINALYSIS, ROUTINE W REFLEX MICROSCOPIC
Bilirubin Urine: NEGATIVE
Glucose, UA: NEGATIVE mg/dL
Hgb urine dipstick: NEGATIVE
Ketones, ur: NEGATIVE mg/dL
Leukocytes,Ua: NEGATIVE
Nitrite: NEGATIVE
Protein, ur: NEGATIVE mg/dL
Specific Gravity, Urine: 1.01 (ref 1.005–1.030)
pH: 5 (ref 5.0–8.0)

## 2018-09-09 LAB — GLUCOSE, CAPILLARY
Glucose-Capillary: 98 mg/dL (ref 70–99)
Glucose-Capillary: 164 mg/dL — ABNORMAL HIGH (ref 70–99)
Glucose-Capillary: 240 mg/dL — ABNORMAL HIGH (ref 70–99)
Glucose-Capillary: 255 mg/dL — ABNORMAL HIGH (ref 70–99)

## 2018-09-09 LAB — PREALBUMIN: Prealbumin: 34.4 mg/dL (ref 18–38)

## 2018-09-09 MED ORDER — STERILE WATER FOR INJECTION IV SOLN
INTRAVENOUS | Status: DC
  Filled 2018-09-09: qty 499.2

## 2018-09-09 MED ORDER — STERILE WATER FOR INJECTION IV SOLN
INTRAVENOUS | Status: DC
  Administered 2018-09-09: 21:00:00 via INTRAVENOUS
  Filled 2018-09-09: qty 499.2

## 2018-09-09 MED ORDER — TRAZODONE HCL 50 MG PO TABS
50.0000 mg | ORAL_TABLET | Freq: Once | ORAL | Status: AC
  Administered 2018-09-09: 50 mg via ORAL
  Filled 2018-09-09: qty 1

## 2018-09-09 MED ORDER — FUROSEMIDE 10 MG/ML IJ SOLN
40.0000 mg | Freq: Every day | INTRAMUSCULAR | Status: DC
  Administered 2018-09-10: 11:00:00 40 mg via INTRAVENOUS
  Filled 2018-09-09: qty 4

## 2018-09-09 NOTE — Progress Notes (Addendum)
PROGRESS NOTE    [name]  OXB:[id number] DOB: Feb 06, 1941 DOA: 08/13/2018 PCP: [name], MD   Brief Narrative:  HPI on 08/13/2018 by Dr. Cherylann Parr is a 78 y.o. female with medical history significant of anemia chronic disease, ASCVD, AICD placement, history of breast cancer, chronic bronchitis, stage III chronic renal disease, systolic heart failure, hyperlipidemia, GERD, history of GI bleed, gout, hypertension, rheumatoid arthritis, impaired hearing, history of UTI who is coming to the emergency department with complaints of abdominal pain apparently for the past 3 days associated with decreased appetite, several episodes of diarrhea, nausea, but no emesis.  She denies fever, cough, sore throat, chest pain, dysuria, frequency, hematuria, melena or hematochezia.  She denies polyuria, polydipsia, polyphagia or blurred vision.  History is limited by the patient severe hearing impairment.  Interim history Patient was admitted with small bowel obstruction, failed conservative management and was transferred to [name] from [facility].  General surgery consulted and appreciated, patient underwent ex lap with lysis of adhesions, bowel resection for ischemic bowel.  She continued on the ventilator was put in ICU as she was also hypotensive and started on pressors.  Patient was also shocked for VT.  She developed progressive renal failure, oliguria.  She was weaned off of pressors and extubated. And TRH assumed care. Started on antibiotics for possible pneumonia.  Patient now with prolonged postop ileus, ongoing TNA, diet gradually being advanced per speech therapy.  She was also noted to have a self-limiting GI bleed.  Patient had acute respiratory distress on 09/01/2018 with decompensated CHF and upper airway wheezing.  PCCM consulted.  Patient with spike in leukocytosis. Will pan-culture, obtain CXR, CT abd, etc. Surgery still following.   Assessment & Plan   Acute  respiratory failure with hypoxia -Noted on 09/01/2018.  Suspected to be due to decompensated CHF and a component of bronchospasm. -PCCM consulted, patient was briefly placed on BiPAP for 3 to 4 hours, given IV Lasix and Solu-Medrol -Has now improved, continuing IV Lasix.  Tapering steroids at this time- Continue solumedrol to 40mg  IV daily.  Continuing Bronchodilators and BiPAP as needed  Septic shock -Resolved and stable -Status post VDRF and pressure support.  Patient extubated on 08/24/2018.  Weaned off pressors on 08/23/2018  Small bowel obstruction, ischemic colon /Postop ileus -Patient failed conservative management of bowel obstruction at [facility] -General surgery consulted and appreciated -Status post expiratory laparotomy for SBO and small bowel section of ischemic necrotic bowel -Completed 10 days of IV Zosyn -NG tube was discontinued -Wound VAC removed on 08/27/2018.  Continue wet to dry dressing changes -Currently on dysphagia 1 diet and tolerating -Noted to have diarrhea on 08/28/2018, Flexi-Seal was placed.  Low index of suspicion for C. difficile in the absence of abdominal pain or leukocytosis.  Diarrhea has now resolved and Flexi-Seal discontinued -patient having daily bowel movements -Continue TNA until patient is able to increase intake -s/p bedside debridement by general surgery on   Leukocytosis -WBC up to 26.7 -Currently afebrile -may be secondary to steroids- however being tapered -will obtain CXR, CT abd,  blood cultures, UA and urine culture -patient completed course of zosyn -discussed starting antibiotics with surgery, will hold off until results are back from CT and various tests -Continue to monitor CBC   Diarrhea -As above, Has now resolved. -Suspect related to resolving postoperative ileus  Acute lower GI bleed -As per surgery, felt to be due to staple line -Heparin was discontinued, currently on  SCDs for DVT prophylaxis -GI bleed has resolved -Hemoglobin  currently 8.5 -Continue to monitor CBC  Acute kidney injury on chronic kidney disease, stage III -Baseline creatinine approximately 1.5-1.8, peaked to 3.2 in the setting of septic shock, ATN and ischemic bowel -Creatinine currently 2.01- patient may have a new baseline -Continue to monitor BMP  Hypophosphatemia/hypomagnesemia -Replaced, will continue to monitor and replace as needed  Hypernatremia -secondary to TPN -pharmacy to adjust saline content -sodium back up to 152 -will decrease lasix dose  -continue to monitor BMP  Non-anion gap metabolic acidosis -Due to diarrhea and mild hyperchloremia -Resolved with sodium bicarbonate  Acute on chronic systolic CHF -Echocardiogram 08/19/2018 shows an EF of 30%.  Inferior basal and inferoseptal akinesis.  Systolic function indeterminate. -Was off of diuretics, ACE inhibitor in the setting of acute kidney injury and septic shock -Patient developed progressive dyspnea and wheezing on 08/31/2018.  Thought to be due to fluids as well as TNA -Patient was given IV Lasix on 09/01/2018 with good urine output -Continue to monitor intake and output, daily weights -UOP 2700cc in the past 24hrs  Acute on chronic anemia -Patient did receive 1 unit PRBC this hospitalization -Anemia panel suggestive of anemia of chronic disease -Hemoglobin currently 8.5 -Continue to monitor CBC  Paroxysmal atrial fibrillation/transient NSVT -Status post DC cardioversion 08/20/2018, currently in sinus rhythm -No anticoagulation given GI bleeding issues -Continue amiodarone, Coreg -She will need to follow-up with cardiology, Dr. [name]  Severe protein calorie malnutrition -Currently on TNA and dysphagia 1 diet -speech therapy following  CAD -Complaints of chest pain -Continue Coreg  Thrombocytopenia -recovered and resolved  Hyperglycemia/ prediabetes -Suspect secondary to steroids and tapering. -Continue insulin sliding scale and CBG monitoring  -Hemoglobin A1c 5.8  Physical deconditioning -Likely secondary to acute illness, prolonged hospitalization, underlying medical problems -PT and OT recommending SNF  DVT Prophylaxis  SCDs  Code Status: Full  Family Communication: None at bedside. Husband via phone.  Disposition Plan: Admitted. Pending improvement in nutritional status and weaning of TPN.  SNF on discharge  Consultants PCCM General surgery Cardiology  Procedures  Cardioversion 08/20/2018 Ex laparotomy with lysis of adhesion, bowel resection for ischemic bowel  Antibiotics   Anti-infectives (From admission, onward)   Start     Dose/Rate Route Frequency Ordered Stop   08/23/18 1400  piperacillin-tazobactam (ZOSYN) IVPB 2.25 g     2.25 g 100 mL/hr over 30 Minutes Intravenous Every 8 hours 08/23/18 0928 08/30/18 2230   08/21/18 1200  piperacillin-tazobactam (ZOSYN) IVPB 3.375 g  Status:  Discontinued     3.375 g 12.5 mL/hr over 240 Minutes Intravenous Every 8 hours 08/21/18 1102 08/23/18 0928   08/14/18 1000  hydroxychloroquine (PLAQUENIL) tablet 200 mg  Status:  Discontinued    Note to Pharmacy:  Take 1 tablet by mouth daily Monday through Friday only     200 mg Oral Once per day on Mon Tue Wed Thu Fri 08/14/18 0742 08/21/18 1147   08/13/18 2200  doxycycline (VIBRA-TABS) tablet 100 mg  Status:  Discontinued     100 mg Oral Every 12 hours 08/13/18 2134 08/13/18 2135   08/13/18 2200  doxycycline (VIBRAMYCIN) 100 mg in sodium chloride 0.9 % 250 mL IVPB  Status:  Discontinued     100 mg 125 mL/hr over 120 Minutes Intravenous Every 12 hours 08/13/18 2135 08/19/18 1702   08/13/18 2145  cefTRIAXone (ROCEPHIN) 1 g in sodium chloride 0.9 % 100 mL IVPB  Status:  Discontinued     1 g  200 mL/hr over 30 Minutes Intravenous Every 24 hours 08/13/18 2134 08/14/18 0743      Subjective:   [name] seen and examined today.  No complaints this morning. Only complaining of being cold. Denies chest pain, shortness of breath,  nausea, vomiting, diarrhea, dizziness, headache. Feels abdominal pain is about the same. Objective:   Vitals:   09/08/18 2035 09/09/18 0043 09/09/18 0349 09/09/18 0645  BP: (!) 129/51 (!) 156/136 (!) 133/44   Pulse:   87   Resp: (!) 22 15 18 18   Temp: 98.1 F (36.7 C) 98.3 F (36.8 C) 98.2 F (36.8 C)   TempSrc: Oral Oral Oral   SpO2: 99% 98% 96%   Weight:    62.6 kg  Height:        Intake/Output Summary (Last 24 hours) at 09/09/2018 0713 Last data filed at 09/09/2018 1017 Gross per 24 hour  Intake 686.4 ml  Output 2825 ml  Net -2138.6 ml   Filed Weights   09/02/18 0650 09/05/18 0325 09/09/18 0645  Weight: 73 kg 68.8 kg 62.6 kg   Exam  General: Well developed, thin, chronically ill appearing, NAD  HEENT: NCAT, mucous membranes moist.   Cardiovascular: S1 S2 auscultated, RRR, soft SEM  Respiratory: Clear to auscultation bilaterally with equal chest rise  Abdomen: Soft, nontender, nondistended, + bowel sounds, dressing in place  Extremities: warm dry without cyanosis clubbing or edema  Neuro: AAOx3, hard of hearing, otherwise nonfocal  Psych: Pleasant, appropriate mood and affect  Data Reviewed: I have personally reviewed following labs and imaging studies  CBC: Recent Labs  Lab 09/03/18 0440  09/04/18 0511 09/04/18 0640 09/05/18 0325 09/06/18 0500 09/07/18 0500 09/08/18 0500 09/09/18 0320  WBC 10.1  --  11.8* 9.4  --   --   --  17.2* 26.7*  NEUTROABS  --   --   --   --   --   --   --  13.4*  --   HGB 7.0*   < > 4.5* 7.1* 7.5* 8.1* 8.9* 8.5* 8.5*  HCT 21.7*   < > 14.4* 22.6* 24.1* 25.9* 28.8* 28.0* 27.5*  MCV 84.8  --  86.2 85.6  --   --   --  89.7 90.5  PLT 211  --  304 293  --   --   --  285 250   < > = values in this interval not displayed.   Basic Metabolic Panel: Recent Labs  Lab 09/03/18 0440 09/04/18 0511 09/05/18 0325 09/06/18 0500 09/07/18 0500 09/08/18 0500 09/09/18 0320  NA 142 144 146* 151* 148* 152* 152*  K 3.7 3.5 3.1* 3.6 3.7  3.8 4.4  CL 105 101 103 104 103 105 107  CO2 25 27 30  32 31 33* 31  GLUCOSE 213* 189* 173* 155* 248* 99 197*  BUN 138* 153* 156* 160* 157* 161* 168*  CREATININE 2.53* 2.51* 2.27* 2.18* 2.08* 2.04* 2.01*  CALCIUM 8.7* 8.3* 8.1* 8.6* 8.6* 8.6* 8.5*  MG  --  1.9  --   --   --  2.1  --   PHOS 4.8* 5.0*  --   --   --  6.0*  --    GFR: Estimated Creatinine Clearance: 19.6 mL/min (A) (by C-G formula based on SCr of 2.01 mg/dL (H)). Liver Function Tests: Recent Labs  Lab 09/03/18 0440 09/04/18 0511 09/08/18 0500  AST 52* 60* 49*  ALT 34 46* 66*  ALKPHOS 323* 281* 174*  BILITOT 2.7* 2.5* 3.6*  PROT 5.5* 5.4* 5.8*  ALBUMIN 1.4* 1.5* 1.8*   No results for input(s): LIPASE, AMYLASE in the last 168 hours. No results for input(s): AMMONIA in the last 168 hours. Coagulation Profile: No results for input(s): INR, PROTIME in the last 168 hours. Cardiac Enzymes: No results for input(s): CKTOTAL, CKMB, CKMBINDEX, TROPONINI in the last 168 hours. BNP (last 3 results) No results for input(s): PROBNP in the last 8760 hours. HbA1C: No results for input(s): HGBA1C in the last 72 hours. CBG: Recent Labs  Lab 09/08/18 1713 09/08/18 1835 09/08/18 2030 09/09/18 0103 09/09/18 0358  GLUCAP 418* 461* 263* 98 164*   Lipid Profile: Recent Labs    09/08/18 0500  TRIG 118   Thyroid Function Tests: No results for input(s): TSH, T4TOTAL, FREET4, T3FREE, THYROIDAB in the last 72 hours. Anemia Panel: No results for input(s): VITAMINB12, FOLATE, FERRITIN, TIBC, IRON, RETICCTPCT in the last 72 hours. Urine analysis:    Component Value Date/Time   COLORURINE AMBER (A) 08/13/2018 1849   APPEARANCEUR CLOUDY (A) 08/13/2018 1849   LABSPEC 1.016 08/13/2018 1849   PHURINE 5.0 08/13/2018 1849   GLUCOSEU NEGATIVE 08/13/2018 1849   HGBUR MODERATE (A) 08/13/2018 1849   BILIRUBINUR NEGATIVE 08/13/2018 1849   KETONESUR NEGATIVE 08/13/2018 1849   PROTEINUR 30 (A) 08/13/2018 1849   UROBILINOGEN 0.2  07/17/2013 2240   NITRITE NEGATIVE 08/13/2018 1849   LEUKOCYTESUR TRACE (A) 08/13/2018 1849   Sepsis Labs: @LABRCNTIP (procalcitonin:4,lacticidven:4)  ) Recent Results (from the past 240 hour(s))  Novel Coronavirus, NAA (hospital order; send-out to ref lab)     Status: None   Collection Time: 09/01/18 10:10 AM  Result Value Ref Range Status   SARS-CoV-2, NAA NOT DETECTED NOT DETECTED Final    Comment: (NOTE) This test was developed and its performance characteristics determined by [employer]. This test has not been FDA cleared or approved. This test has been authorized by FDA under an Emergency Use Authorization (EUA). This test is only authorized for the duration of time the declaration that circumstances exist justifying the authorization of the emergency use of in vitro diagnostic tests for detection of SARS-CoV-2 virus and/or diagnosis of COVID-19 infection under section 564(b)(1) of the Act, 21 U.S.C. 454UJW-1(X)(9), unless the authorization is terminated or revoked sooner. When diagnostic testing is negative, the possibility of a false negative result should be considered in the context of a patient's recent exposures and the presence of clinical signs and symptoms consistent with COVID-19. An individual without symptoms of COVID-19 and who is not shedding SARS-CoV-2 virus would expect to have a negative (not detected) result in this assay. Performed  At: [facility] [address] [place], [place] [id number] Rush Farmer MD ZH:[id number]    [place]  Final    Comment: Performed at [facility], [place] [address] [place], [place] [address]      Radiology Studies: No results found.   Scheduled Meds: . amiodarone  200 mg Oral Daily  . carvedilol  3.125 mg Oral BID WC  . chlorhexidine  15 mL Mouth Rinse BID  . feeding supplement (ENSURE ENLIVE)  237 mL Oral BID BM  . furosemide  40 mg Intravenous Q12H  . insulin  aspart  0-20 Units Subcutaneous Q6H  . mouth rinse  15 mL Mouth Rinse q12n4p  . methylPREDNISolone (SOLU-MEDROL) injection  40 mg Intravenous Daily  . mupirocin ointment   Nasal BID  . pantoprazole (PROTONIX) IV  40 mg Intravenous QHS  . saccharomyces boulardii  250 mg Oral BID   Continuous Infusions: . TPN ADULT (ION) 60 mL/hr at 09/08/18 1733     LOS: 27 days   Time Spent in minutes   30 minutes  Alline Pio D.O. on 09/09/2018 at 7:13 AM  Between 7am to 7pm - Please see pager noted on [email or web address]  After 7pm go to [email or web address]  And look for the night coverage person covering for me after hours  [facility] Office  [phone]

## 2018-09-09 NOTE — Progress Notes (Signed)
PHARMACY - ADULT TOTAL PARENTERAL NUTRITION CONSULT NOTE   Pharmacy Consult for TPN Indication: s/p ex lap with SBR for SBO and ischemic/necroticbowel  Patient Measurements: Height: '5\' 4"'  (162.6 cm) Weight: 138 lb 0.1 oz (62.6 kg)(removed SCDs) IBW/kg (Calculated) : 54.7 TPN AdjBW (KG): 64.8 Body mass index is 23.69 kg/m.  Assessment:  78 year old female with PMH significant for anemia, CAD s/p AICD placement, breast cancer, CKD III, CHF, HLD, impaired hearing, and RA. She presented to [facility] on 4/23 with CAP and SBO. CT on 4/28 show persistent SBO and patient was transferred to [facility] for surgery. On 4/30, an ex lap, lysis of adhesions and small bowel resection was performed. As of 5/4, Patient has been ~12 days without nutrition and pharmacy was consulted 5/4 for TPN with plan for continued bowel rest and NG until bowel function returns.  GI: NGT removed 5/5, JP drain none documented. PPI IV. Pre-albumin up 8.6 >> 33.2 >>34.4 (meeting protein goals). No N/V. +Flatus. LBM 5/15 (flexiseal removed). Dyphagia 1 diet initiated 5/9. Calorie count initiated x48 hours.  Endo: No history of diabetes. CBGs variable - lows of 98 then spiked 461 after AM steroid dose yesterday. Insulin requirements in the past 24 hours: 28 units of resistant SSI + 10 units of insulin in TPN. Lytes: Na up 152 (sodium removed from TPN - this along with high BUN could be indicative of dehydration on IV Lasix); K 4.4; Phos elevated at 6, Mg 2.1.  Renal: Scr 2.01 (trending down), off CRRT - no plan for dialysis. BUN 168 (climbing on Lasix IV BID; Hgb stable, protein reduced in TPN). UOP 1.9 mL/kg/hr.  Net -4L. *Reducing IV Lasix to daily per Dr. Ree Kida today.  Pulm: Extubated on 5/3. Required BiPAP 5/12, now RA. Steroids weaning but CBGs still elevatyed + Lasix. Cards: Amiodarone po (s/p VT arrest earlier in admission); V-paced at 70, BP ok. Hepatobil: AST down to 49, ALT up 66. Tbili up 3.6. Alk Phos down to 174. TG  118. Neuro: oriented to person only, complains of abdominal pain ID: WBC climbing to 26.7 (steroids decreasing), afebrile off Zosyn. New blood and urine cultures sent this AM.   TPN Access: CVC triple lumen placed 4/30 TPN start date: 5/4 Nutritional Goals (per RD recommendation on 5/19): Kcal: 2000-2200 kcal Protein:  100-120 grams (decreased to 75 gms or 1 gm/kg due to rising BUN) Fluid:  >/= 2 L/day   Goal TPN rate is 60 ml/hr (provides 75 g of protein, 288 g of dextrose, and 60 g of lipids which provides 2002 kCals per day, meeting 100% of patient needs) -- reduced protein to 1 gm/kg due to rising BUN and concentrated to reduce volume.   Current Nutrition:  Dysphagia 1, thin liquid diet + Magic cup + Ensure Calorie Count: 53% kcal needs and 39% protein needs on 5/18 TPN at goal  Plan:  Continue TPN at 60 mL/hr  This TPN provides  75g of protein, 288g of dextrose, and 60g of lipids which provides 2002 kCals per day, meeting 100% of patient's protein and calorie needs.  Electrolytes in TPN: Remove Na, Phos, and Ca. Reduce K slightly with decreased Lasix, maximize Cl (gives 1:1.1 ratio based on low lytes in TPN) Add MVI to TPN - trace elements are on back order, will only place in TPN on Monday, Wednesday and Friday Continue with 10 units of insulin in TPN- prefer not to treat with TPN as hyperglycemia result of steroids as seen with spike post  dose and then downward trend therefore being cautious with insulin in TPN.  Continue resistant SSI Monitor TPN labs- repeat BMET, Mg and Phos in AM Follow-up diet/calorie counts and ability to wean TPN  *Consider 1/2 rate TPN to stimulate po intake  Sloan Leiter, PharmD, BCPS, BCCCP Clinical Pharmacist Please check AMION for all [place] numbers 09/09/2018 7:22 AM

## 2018-09-09 NOTE — Progress Notes (Signed)
Abdominal dressing done. Mepitel placed over wound. Dressing change per MD order. Will continue to monitor.   [name] Fear, RN

## 2018-09-09 NOTE — Progress Notes (Addendum)
Nutrition Follow-up  RD working remotely.  DOCUMENTATION CODES:   Severe malnutrition in context of acute illness/injury  INTERVENTION:   -Continue Ensure Enlive po BID, each supplement provides 350 kcal and 20 grams of protein -Continue Magic Cup TID with meals, each supplement provides 290 kcals and 9 grams protein -TPN management per pharmacy -Continue feeding assistance with meals -Continue 48 hour calorie count  NUTRITION DIAGNOSIS:   Severe Malnutrition related to acute illness(SBO) as evidenced by energy intake < or equal to 50% for > or equal to 5 days, mild fat depletion, moderate muscle depletion.  Ongoing  GOAL:   Patient will meet greater than or equal to 90% of their needs  Met with TPN  MONITOR:   Diet advancement, Weight trends, Labs, Vent status, TF tolerance, Skin, I & O's  REASON FOR ASSESSMENT:   Consult Calorie Count  ASSESSMENT:   Patient with PMH significant for anemia, CAD s/p AICD placement, breast cancer, CKD III, CHF, HLD, impaired hearing, and RA. Presented to [facility] on 4/23 with CAP and SBO.   4/28- CT scan showed persistent SBO, tx to [facility] for surgery 4/30- ex lap, lysis of adhesions, small bowel resection due to ischemic changes 5/3- extubated 5/4- TPN started  5/5- NGT removed  5/6- diet advanced to full liquid 5/9- diet advanced DYS 1, thin liquids  Reviewed I/O's: -2.2 L x 24 hours and -4.2 L since 08/26/18  UOP: 2.8 L x 24 hours  Attempted to speak with pt by phone, however, unable to reach.   Noted pt refused SLP treatment on 09/05/18 due to not feeling hungry. Per SLP notes yesterday, pt with minimal PO's, consuming only Magic Cup and a few sips of liquids. Documented meal completion 10-25%. She accepts only one dose of Ensure daily.   Calorie count was ordered by general surgery yesterday morning.   Noted pt has experienced a 14.2% wt loss over the past week. Pt -2.2 L negative within the past 24 hours  09/08/18 Breakfast:  313 kcals, 10 grams protein Lunch: 290 kcals, 9 grams protein Dinner: 290 kcals, 9 grams protein Supplements: One Ensure supplement (350 kcals, 20 grams protein), however, assume pt did not consume 100%- for calorie count purposes assumed pt consumed 50% of supplement  Total intake: 1068 kcal (53% of minimum estimated needs)  39 grams protein (39% of minimum estimated needs)  Pt remains on TPN at 60 ml/hr, which provides 2022 kcals and 75 grams protein, meeting 100% of pt's kcal needs and 75% of estimated protein needs.   Spoke with pharmacist via secure chat. Plan to discuss halfing TPN dose with surgery today; RD agrees with this plan in attempt to stimulate appetite. Pharmacy also decreased volume and protein in TPN secondary to rising BUN. Per discussion with pharmacist later on today, surgery would like to continue full dose TPN today and continue to monitor PO intake.   Medications reviewed and include solu-medrol.   Labs reviewed: Na: 152, CBGS: 164 (inpatient orders for glycemic control are 0-20 units insulin aspart every 6 hours).   Diet Order:   Diet Order            DIET - DYS 1 Room service appropriate? No; Fluid consistency: Thin  Diet effective now              EDUCATION NEEDS:   Not appropriate for education at this time  Skin:  Skin Assessment: Skin Integrity Issues: Skin Integrity Issues:: Other (Comment), Stage II, Incisions Stage II: buttocks,  coccyx Incisions: closed abdomen, closed rt arm Other: MASD medial bilateral buttocks; open wound to rt lateral arm  Last BM:  09/09/18  Height:   Ht Readings from Last 1 Encounters:  08/21/18 '5\' 4"'  (1.626 m)    Weight:   Wt Readings from Last 1 Encounters:  09/09/18 62.6 kg    Ideal Body Weight:  54.5 kg  BMI:  Body mass index is 23.69 kg/m.  Estimated Nutritional Needs:   Kcal:  2000-2200 kcal  Protein:  100-120 grams  Fluid:  >/= 2 L/day    Tonita Bills A. Jimmye Norman, RD, LDN, [place] Registered  Dietitian II Certified Diabetes Care and Education Specialist Pager: [phone] After hours Pager: [phone]

## 2018-09-09 NOTE — Progress Notes (Signed)
Patient ID: [name], female   DOB: [birth year], 78 y.o.   MRN: [id number]    19 Days Post-Op  Subjective: Patient denies pain.  Won't eat or drink.  Objective: Vital signs in last 24 hours: Temp:  [98 F (36.7 C)-98.3 F (36.8 C)] 98 F (36.7 C) (05/19 0850) Pulse Rate:  [84-87] 85 (05/19 0850) Resp:  [15-22] 22 (05/19 0850) BP: (104-156)/(43-136) 113/49 (05/19 0850) SpO2:  [94 %-99 %] 94 % (05/19 0850) Weight:  [62.6 kg] 62.6 kg (05/19 0645) Last BM Date: 09/09/18  Intake/Output from previous day: 05/18 0701 - 05/19 0700 In: 686.4 [I.V.:686.4] Out: 2825 [Urine:2825] Intake/Output this shift: No intake/output data recorded.  PE: Abd: soft, wound is more dehisced and bowel is somewhat eviscerated but stuck.  Will place mepitel down prior to WD dressing change.    Lab Results:  Recent Labs    09/08/18 0500 09/09/18 0320  WBC 17.2* 26.7*  HGB 8.5* 8.5*  HCT 28.0* 27.5*  PLT 285 250   BMET Recent Labs    09/08/18 0500 09/09/18 0320  NA 152* 152*  K 3.8 4.4  CL 105 107  CO2 33* 31  GLUCOSE 99 197*  BUN 161* 168*  CREATININE 2.04* 2.01*  CALCIUM 8.6* 8.5*   PT/INR No results for input(s): LABPROT, INR in the last 72 hours. CMP     Component Value Date/Time   NA 152 (H) 09/09/2018 0320   K 4.4 09/09/2018 0320   CL 107 09/09/2018 0320   CO2 31 09/09/2018 0320   GLUCOSE 197 (H) 09/09/2018 0320   BUN 168 (H) 09/09/2018 0320   CREATININE 2.01 (H) 09/09/2018 0320   CREATININE 1.07 (H) 02/13/2018 1142   CALCIUM 8.5 (L) 09/09/2018 0320   PROT 5.8 (L) 09/08/2018 0500   ALBUMIN 1.8 (L) 09/08/2018 0500   AST 49 (H) 09/08/2018 0500   ALT 66 (H) 09/08/2018 0500   ALKPHOS 174 (H) 09/08/2018 0500   BILITOT 3.6 (H) 09/08/2018 0500   GFRNONAA 23 (L) 09/09/2018 0320   GFRNONAA 49 (L) 02/13/2018 1142   GFRAA 27 (L) 09/09/2018 0320   GFRAA 57 (L) 02/13/2018 1142   Lipase     Component Value Date/Time   LIPASE 30 08/13/2018 1926       Studies/Results:  No results found.  Anti-infectives: Anti-infectives (From admission, onward)   Start     Dose/Rate Route Frequency Ordered Stop   08/23/18 1400  piperacillin-tazobactam (ZOSYN) IVPB 2.25 g     2.25 g 100 mL/hr over 30 Minutes Intravenous Every 8 hours 08/23/18 0928 08/30/18 2230   08/21/18 1200  piperacillin-tazobactam (ZOSYN) IVPB 3.375 g  Status:  Discontinued     3.375 g 12.5 mL/hr over 240 Minutes Intravenous Every 8 hours 08/21/18 1102 08/23/18 0928   08/14/18 1000  hydroxychloroquine (PLAQUENIL) tablet 200 mg  Status:  Discontinued    Note to Pharmacy:  Take 1 tablet by mouth daily Monday through Friday only     200 mg Oral Once per day on Mon Tue Wed Thu Fri 08/14/18 0742 08/21/18 1147   08/13/18 2200  doxycycline (VIBRA-TABS) tablet 100 mg  Status:  Discontinued     100 mg Oral Every 12 hours 08/13/18 2134 08/13/18 2135   08/13/18 2200  doxycycline (VIBRAMYCIN) 100 mg in sodium chloride 0.9 % 250 mL IVPB  Status:  Discontinued     100 mg 125 mL/hr over 120 Minutes Intravenous Every 12 hours 08/13/18 2135 08/19/18 1702   08/13/18 2145  cefTRIAXone (ROCEPHIN) 1 g in sodium chloride 0.9 % 100 mL IVPB  Status:  Discontinued     1 g 200 mL/hr over 30 Minutes Intravenous Every 24 hours 08/13/18 2134 08/14/18 0743       Assessment/Plan POD19, s/p ex lapw/SBR, LOA, appendectomyfor SBO&ischemic/necroticbowel, 4/30 - PT - prealbumin somehow 34 today.  i'm not sure how this is accurate except for her TNA which usually doesn't increase prealbumin that quickly.  She isn't taking in much.  Calorie count in place for 48hrs. -cont diet and supplementation.   -JPremoved -completed zosyn x10 days, patient afebrile - Mobilize and IS -wound remains dehisced, with some evisceration, but seems stuck for now.  Will place mepitel over wound then dressing change -CT Abd/pel secondary to elevated WBC of 27K today.  AF  Acute on chronic renal failure- Cr 2.04  ICD/CAD/CHF-per  cardiology,on amio (oral)  Acute Respiratory Failure- resolved.   ABL Anemia-hgb stable  FEN -TPN,DYS1 (AAT per speech) VTE - SCDs, Mobilize as tolerating with PT. ID -zosyn 4/30>>5/9.WBCup to 58, AF, will repeat in am and follow Foley - Removed 5/7 Follow up - Dr. Marlou Starks   LOS: 27 days    [name] , [facility] 09/09/2018, 9:18 AM Pager: [phone]

## 2018-09-10 LAB — GLUCOSE, CAPILLARY
Glucose-Capillary: 116 mg/dL — ABNORMAL HIGH (ref 70–99)
Glucose-Capillary: 202 mg/dL — ABNORMAL HIGH (ref 70–99)
Glucose-Capillary: 206 mg/dL — ABNORMAL HIGH (ref 70–99)

## 2018-09-10 LAB — BASIC METABOLIC PANEL
Anion gap: 15 (ref 5–15)
Anion gap: 12 (ref 5–15)
BUN: 154 mg/dL — ABNORMAL HIGH (ref 8–23)
BUN: 143 mg/dL — ABNORMAL HIGH (ref 8–23)
CO2: 30 mmol/L (ref 22–32)
CO2: 31 mmol/L (ref 22–32)
Calcium: 8.7 mg/dL — ABNORMAL LOW (ref 8.9–10.3)
Calcium: 8.8 mg/dL — ABNORMAL LOW (ref 8.9–10.3)
Chloride: 111 mmol/L (ref 98–111)
Chloride: 112 mmol/L — ABNORMAL HIGH (ref 98–111)
Creatinine, Ser: 1.96 mg/dL — ABNORMAL HIGH (ref 0.44–1.00)
Creatinine, Ser: 1.91 mg/dL — ABNORMAL HIGH (ref 0.44–1.00)
GFR calc Af Amer: 28 mL/min — ABNORMAL LOW (ref >60)
GFR calc Af Amer: 28 mL/min — ABNORMAL LOW (ref >60)
GFR calc non Af Amer: 24 mL/min — ABNORMAL LOW (ref >60)
GFR calc non Af Amer: 24 mL/min — ABNORMAL LOW (ref >60)
Glucose, Bld: 112 mg/dL — ABNORMAL HIGH (ref 70–99)
Glucose, Bld: 91 mg/dL (ref 70–99)
Potassium: 4.2 mmol/L (ref 3.5–5.1)
Potassium: 4.3 mmol/L (ref 3.5–5.1)
Sodium: 156 mmol/L — ABNORMAL HIGH (ref 135–145)
Sodium: 155 mmol/L — ABNORMAL HIGH (ref 135–145)

## 2018-09-10 LAB — CBC
HCT: 26.7 % — ABNORMAL LOW (ref 36.0–46.0)
Hemoglobin: 7.9 g/dL — ABNORMAL LOW (ref 12.0–15.0)
MCH: 27.6 pg (ref 26.0–34.0)
MCHC: 29.6 g/dL — ABNORMAL LOW (ref 30.0–36.0)
MCV: 93.4 fL (ref 80.0–100.0)
Platelets: 214 10*3/uL (ref 150–400)
RBC: 2.86 MIL/uL — ABNORMAL LOW (ref 3.87–5.11)
RDW: 22.1 % — ABNORMAL HIGH (ref 11.5–15.5)
WBC: 18.1 10*3/uL — ABNORMAL HIGH (ref 4.0–10.5)
nRBC: 0.2 % (ref 0.0–0.2)

## 2018-09-10 LAB — PHOSPHORUS: Phosphorus: 5 mg/dL — ABNORMAL HIGH (ref 2.5–4.6)

## 2018-09-10 LAB — MAGNESIUM: Magnesium: 2.4 mg/dL (ref 1.7–2.4)

## 2018-09-10 MED ORDER — STERILE WATER FOR INJECTION IV SOLN: INTRAVENOUS | Status: DC

## 2018-09-10 MED ORDER — STERILE WATER FOR INJECTION IV SOLN
INTRAVENOUS | Status: AC
  Filled 2018-09-10: qty 499.2

## 2018-09-10 MED ORDER — TRAZODONE HCL 50 MG PO TABS
50.0000 mg | ORAL_TABLET | Freq: Once | ORAL | Status: AC
  Administered 2018-09-10: 23:00:00 50 mg via ORAL
  Filled 2018-09-10: qty 1

## 2018-09-10 MED ORDER — DEXTROSE 5 % IV SOLN
INTRAVENOUS | Status: DC
  Administered 2018-09-10: 20:00:00 via INTRAVENOUS

## 2018-09-10 MED ORDER — TRACE MINERALS CR-CU-MN-SE-ZN 10-1000-500-60 MCG/ML IV SOLN
INTRAVENOUS | Status: AC
  Administered 2018-09-10: 18:00:00 via INTRAVENOUS
  Filled 2018-09-10: qty 249.6

## 2018-09-10 MED ORDER — TRACE MINERALS CR-CU-MN-SE-ZN 10-1000-500-60 MCG/ML IV SOLN
INTRAVENOUS | Status: DC
  Filled 2018-09-10: qty 499.2

## 2018-09-10 NOTE — Progress Notes (Signed)
Calorie Count Note  48 hour calorie count ordered.  Diet: DYS 1 diet, thin liquids  Supplements: Magic Cup TID, Ensure Enlive BID, TPN  DAY 2  5/19-5/20 Breakfast: refused  Lunch: 290 kcal, 9 grams protein  Dinner: 60 kcal, 2 grams protein  Supplements: refused all Ensure   Total intake: 350 kcal (18% of minimum estimated needs)  11 grams protein (11% of minimum estimated needs)  Estimated Nutritional Needs:  Kcal:  2000-2200 kcal Protein:  100-120 grams Fluid:  >/= 2 L/day   Nutrition Dx: Severe Malnutrition related to acute illness(SBO) as evidenced by energy intake < or equal to 50% for > or equal to 5 days, mild fat depletion, moderate muscle depletion- ongoing  Goal: Patient will meet greater than or equal to 90% of their needs- met with TPN  Intervention:   -Continue TPN @ goal rate of 60 ml/hr to meet 100% of needs.  -Continue Ensure Enlive po BID, each supplement provides 350 kcal and 20 grams of protein -Continue Magic Cup TID with meals, each supplement provides 290 kcals and 9 grams protein -Continue feeding assistance with meals -D/C 48 hour calorie count  Mariana Single RD, LDN Clinical Nutrition Pager # [phone]

## 2018-09-10 NOTE — Progress Notes (Addendum)
PHARMACY - ADULT TOTAL PARENTERAL NUTRITION CONSULT NOTE   Pharmacy Consult for TPN Indication: s/p ex lap with SBR for SBO and ischemic/necroticbowel  Patient Measurements: Height: _0  (162.6 cm) Weight: 138 lb 0.1 oz (62.6 kg)(removed SCDs) IBW/kg (Calculated) : 54.7 TPN AdjBW (KG): 64.8 Body mass index is 23.69 kg/m.  Assessment:  78 year old female with PMH significant for anemia, CAD s/p AICD placement, breast cancer, CKD III, CHF, HLD, impaired hearing, and RA. She presented to [facility] on 4/23 with CAP and SBO. CT on 4/28 show persistent SBO and patient was transferred to [facility] for surgery. On 4/30, an ex lap, lysis of adhesions and small bowel resection was performed. As of 5/4, Patient has been ~12 days without nutrition and pharmacy was consulted 5/4 for TPN with plan for continued bowel rest and NG until bowel function returns.  GI: NGT removed 5/5, JP drain none documented. PPI IV. Pre-albumin up 8.6 >> 33.2 >>34.4 (meeting protein goals). No N/V. +Flatus. LBM 5/18. Dyphagia 1 diet -Calorie count initiated x48 hours.  Endo: No history of diabetes. CBGs overall improving with reduced steroids - 112 low, up to 255 after AM steroid dose yesterday. Insulin requirements in the past 24 hours: 25 units of resistant SSI + 10 units of insulin in TPN. Lytes: Na up 156 (sodium removed from TPN - this along with high BUN could be indicative of dehydration on IV Lasix); K 4.2; Phos elevated at 5 but trending down (removed from TPN), Mg 2.4.  Renal: Scr 1.96 (trending down), off CRRT - no plan for dialysis. BUN down to 154 after decreasing Lasix IV to daily; protein reduced in TPN). UOP 0.5 mL/kg/hr.  Net -2L.  Pulm: Extubated on 5/3. Required BiPAP 5/12, now RA. Steroids weaning but CBGs still elevatyed + Lasix. Cards: Amiodarone po (s/p VT arrest earlier in admission); V-paced at 70, BP ok. Hepatobil: AST down to 49, ALT up 66. Tbili up 3.6. Alk Phos down to 174. TG 118. Neuro: oriented to person  only, complains of abdominal pain ID: WBC down to 18.1 (steroids decreasing), afebrile off Zosyn. New blood and urine cultures re-sent 5/19.  TPN Access: CVC triple lumen placed 4/30 TPN start date: 5/4 Nutritional Goals (per RD recommendation on 5/19): Kcal: 2000-2200 kcal Protein:  100-120 grams (decreased to 75 gms or 1 gm/kg due to rising BUN) Fluid:  >/= 2 L/day   Goal TPN rate is 60 ml/hr (provides 75 g of protein, 288 g of dextrose, and 60 g of lipids which provides 2002 kCals per day, meeting 100% of patient needs) -- reduced protein to 1 gm/kg due to rising BUN and concentrated to reduce volume.   Current Nutrition:  Dysphagia 1, thin liquid diet + Magic cup + Ensure Calorie Count: 53% kcal needs and 39% protein needs on 5/18 TPN at goal  Plan:  Reduce TPN to half rate at 30 mL/hr per Surgery This TPN provides  37.4g of protein, 144g of dextrose, and 30g of lipids which provides 938 kCals per day, meeting 37% of protein needs and  47% of calorie needs.  Electrolytes in TPN: Continue to remove Na, Phos, and Ca. Continue current K and Mg, maximize Cl (gives 1:1.1 ratio based on low lytes in TPN) Add MVI to TPN - trace elements are on back order, will only place in TPN on Monday, Wednesday and Friday Remove insulin from TPN - reducing TPN and steroid induced.  Continue resistant SSI Monitor TPN labs- repeat BMET, Mg and  Phos in AM Follow-up diet/calorie counts and ability to wean TPN   Sloan Leiter, PharmD, BCPS, BCCCP Clinical Pharmacist Please check AMION for all [place] numbers 09/10/2018 7:08 AM

## 2018-09-10 NOTE — Progress Notes (Signed)
Physical Therapy Treatment Patient Details Name: [name] MRN: [id number] DOB: May 06, 1940 Today's Date: 09/10/2018    History of Present Illness Pt is a 78 year old female with significant cardiac history that is post exploratory laparotomy for small bowel obstruction with associated ischemic bowel on 4/30, return to the intensive care on mechanical ventilator, critical care asked to assist with care.  VDRF 4/30-5/3.  Also PNA.  BMW:[id number] heart failure with EF 30%, nonsustained VT, pericardial effusion CAD w/ prior AICD, diastolic heart failure, CKD stage III, hypertension, rheumatoid arthritis, anemia of chronic disease.    PT Comments    Pt very limited this session secondary to lethargy and fatigue. Pt tolerated sitting EOB briefly (~10 minutes) with min-max A required. All VSS throughout. Pt would continue to benefit from skilled physical therapy services at this time while admitted and after d/c to address the below listed limitations in order to improve overall safety and independence with functional mobility.    Follow Up Recommendations  SNF;Supervision/Assistance - 24 hour     Equipment Recommendations  None recommended by PT    Recommendations for Other Services       Precautions / Restrictions Precautions Precautions: Fall Restrictions Weight Bearing Restrictions: No    Mobility  Bed Mobility Overal bed mobility: Needs Assistance Bed Mobility: Supine to Sit;Sit to Supine     Supine to sit: HOB elevated;Mod assist;+2 for safety/equipment Sit to supine: Mod assist;+2 for safety/equipment   General bed mobility comments: increased time and effort, multimodal cueing to complete task, assistance for bilateral LE movement off of and back onto bed, assistance needed for trunk management as well  Transfers Overall transfer level: Needs assistance Equipment used: 2 person hand held assist Transfers: Sit to/from Stand Sit to Stand: Total assist;+2 physical  assistance         General transfer comment: assistance needed for bilateral LE positioning prior to standing; heavy physical assistance needed to achieve partial standing position at EOB  Ambulation/Gait                 Stairs             Wheelchair Mobility    Modified Rankin (Stroke Patients Only)       Balance Overall balance assessment: Needs assistance Sitting-balance support: Feet supported Sitting balance-Leahy Scale: Poor Sitting balance - Comments: fluctuated between requiring min A to max A with posterior trunk lean   Standing balance support: Bilateral upper extremity supported Standing balance-Leahy Scale: Zero Standing balance comment: total A x2                            Cognition Arousal/Alertness: Awake/alert Behavior During Therapy: WFL for tasks assessed/performed Overall Cognitive Status: Difficult to assess Area of Impairment: Following commands;Safety/judgement;Problem solving                       Following Commands: Follows one step commands inconsistently Safety/Judgement: Decreased awareness of safety;Decreased awareness of deficits   Problem Solving: Slow processing;Decreased initiation;Difficulty sequencing;Requires verbal cues;Requires tactile cues        Exercises General Exercises - Lower Extremity Heel Slides: AROM;AAROM;Both;10 reps;Supine    General Comments        Pertinent Vitals/Pain Pain Assessment: Faces Pain Score: 2  Faces Pain Scale: Hurts little more Pain Location: generalized discomfort Pain Descriptors / Indicators: Discomfort Pain Intervention(s): Monitored during session;Repositioned    Home Living  Prior Function            PT Goals (current goals can now be found in the care plan section) Acute Rehab PT Goals PT Goal Formulation: With patient Time For Goal Achievement: 09/08/18 Potential to Achieve Goals: Good Progress towards PT goals:  Progressing toward goals    Frequency    Min 2X/week      PT Plan Current plan remains appropriate    Co-evaluation PT/OT/SLP Co-Evaluation/Treatment: Yes Reason for Co-Treatment: Complexity of the patient's impairments (multi-system involvement);For patient/therapist safety;To address functional/ADL transfers PT goals addressed during session: Mobility/safety with mobility;Balance;Strengthening/ROM        AM-PAC PT "6 Clicks" Mobility   Outcome Measure  Help needed turning from your back to your side while in a flat bed without using bedrails?: A Lot Help needed moving from lying on your back to sitting on the side of a flat bed without using bedrails?: A Lot Help needed moving to and from a bed to a chair (including a wheelchair)?: A Lot Help needed standing up from a chair using your arms (e.g., wheelchair or bedside chair)?: A Lot Help needed to walk in hospital room?: Total Help needed climbing 3-5 steps with a railing? : Total 6 Click Score: 10    End of Session   Activity Tolerance: Patient limited by pain;Patient limited by fatigue;Patient limited by lethargy Patient left: in bed;with call bell/phone within reach;with bed alarm set;with SCD's reapplied Nurse Communication: Mobility status PT Visit Diagnosis: Unsteadiness on feet (R26.81);Muscle weakness (generalized) (M62.81)     Time: 3545-6256 PT Time Calculation (min) (ACUTE ONLY): 24 min  Charges:  $Therapeutic Activity: 8-22 mins                     Sherie Don, PT, DPT  Acute Rehabilitation Services Pager [phone] Office [place] 09/10/2018, 5:21 PM

## 2018-09-10 NOTE — Progress Notes (Signed)
  Speech Language Pathology Treatment: Dysphagia  Patient Details Name: [name] MRN: [id number] DOB: Jan 10, 1941 Today's Date: 09/10/2018 Time: 7482-7078 SLP Time Calculation (min) (ACUTE ONLY): 15 min  Assessment / Plan / Recommendation Clinical Impression  Patient seen to address dysphagia goals with thin liquids. Patient took one sip of liquids, then immediately had a facial grimace and gestured that she needed to spit it out. Patient then spit liquids into paper towel. She refused any other PO's. SLP spoke with NT and RN regarding her continued poor progress and refusing of PO's. Patient will eat ice cream or magic cup supplement dessert and sip on some liquids but does not eat anything else from meal tray. As patient is not progressing, is refusing majority of PO's and is not appropriate for trials of upgraded solids, SLP to s/o at this time.   Please re-order SLP if patient declines with her swallow function. Thank you!    HPI HPI: Patient is a 78 y.o. female with PMH: chronic CHF, CAD with remote MI, HTN, hyperlipidemia, CKD stage III, chronic anemia, GERD, who underwent ex-lap for SBO on 4/30. She was transferred to ICU post-op for severe hypotension and shock, was intubated from 4/30 to 5/3.      SLP Plan  Discharge SLP treatment due to (comment)(patient not progressing with PO intake and overall is declining in functioning. SLP unable to trial upgraded solids as patient is refusing most PO's. Plan to s/o at this time.)       Recommendations  Diet recommendations: Thin liquid;Dysphagia 1 (puree) Liquids provided via: Cup;Straw Medication Administration: Crushed with puree Supervision: Full supervision/cueing for compensatory strategies;Trained caregiver to feed patient Compensations: Minimize environmental distractions;Slow rate;Small sips/bites Postural Changes and/or Swallow Maneuvers: Seated upright 90 degrees;Upright 30-60 min after meal                Oral Care  Recommendations: Oral care BID Follow up Recommendations: Skilled Nursing facility SLP Visit Diagnosis: Dysphagia, oral phase (R13.11) Plan: Discharge SLP treatment due to (comment)(patient not progressing with PO intake and overall is declining in functioning. SLP unable to trial upgraded solids as patient is refusing most PO's. Plan to s/o at this time.)       GO                [name] 09/10/2018, 4:17 PM   [name], MA, [place] [facility] Pager: [phone]

## 2018-09-10 NOTE — Progress Notes (Addendum)
PROGRESS NOTE    [name]  WYO:[id number] DOB: 25-Dec-1940 DOA: 08/13/2018 PCP: [name], MD   Brief Narrative:  HPI on 08/13/2018 by Dr. Bernadene Person [name] 78 y.o.femalewith medical history significant ofanemia chronic disease, ASCVD, AICD placement, history of breast cancer, chronic bronchitis, stage III chronic renal disease, systolic heart failure, hyperlipidemia, GERD, history of GI bleed, gout, hypertension, rheumatoid arthritis, impaired hearing, history of UTI who is coming to the emergency department with complaints of abdominal pain apparently for the past 3 days associated with decreased appetite, several episodes of diarrhea, nausea, but no emesis. She denies fever, cough, sore throat, chest pain, dysuria, frequency, hematuria, melena or hematochezia. She denies polyuria, polydipsia, polyphagia or blurred vision. History is limited by the patient severe hearing impairment.  Interim history Patient was admitted with small bowel obstruction, failed conservative management and was transferred to [name].  General surgery consulted and appreciated, patient underwent ex lap with lysis of adhesions, bowel resection for ischemic bowel.  She continued on the ventilator was put in ICU as she was also hypotensive and started on pressors.  Patient was also shocked for VT.  She developed progressive renal failure, oliguria.  She was weaned off of pressors and extubated. And TRH assumed care. Started on antibiotics for possible pneumonia.  Patient now with prolonged postop ileus, ongoing TNA, diet gradually being advanced per speech therapy.  She was also noted to have [name] self-limiting GI bleed.  Patient had acute respiratory distress on 09/01/2018 with decompensated CHF and upper airway wheezing.  PCCM consulted.  Patient with spike in leukocytosis. Will pan-culture, obtain CXR, CT abd, etc. Surgery still following.   Assessment & Plan:     Principal Problem:   CAP (community acquired pneumonia) Active Problems:   Dyslipidemia   Anemia of chronic disease   CAD S/P remote PCI- no details   Chronic combined systolic and diastolic CHF (congestive heart failure) (HCC)   AKI (acute kidney injury) (HCC)   SBO (small bowel obstruction) (HCC)   Pressure injury of skin   Acute renal failure superimposed on stage 3 chronic kidney disease (HCC)   Wide-complex tachycardia (HCC)   Protein-calorie malnutrition, severe   Acute respiratory failure with hypoxia ([name])   Acute respiratory failure with hypoxia -Noted on 09/01/2018.  Suspected to be due to decompensated CHF and [name] component of bronchospasm. -PCCM consulted, patient was briefly placed on BiPAP for 3 to 4 hours, given IV Lasix and Solu-Medrol -Has now improved, lasix on hold with hypernatremia below.  Tapering steroids at this time- Continue solumedrol to 40mg  IV daily.  Continuing Bronchodilators and BiPAP as needed  Septic shock -Resolved and stable -Status post VDRF and pressure support.  Patient extubated on 08/24/2018.  Weaned off pressors on 08/23/2018  Small bowel obstruction, ischemic colon /Postop ileus -Patient failed conservative management of bowel obstruction at [name] -General surgery consulted and appreciated -Status post expiratory laparotomy for SBO and small bowel section of ischemic necrotic bowel -Completed 10 days of IV Zosyn -NG tube was discontinued -Wound VAC removed on 08/27/2018.  Continue wet to dry dressing changes -Currently on dysphagia 1 diet and tolerating -Noted to have diarrhea on 08/28/2018, Flexi-Seal was placed.  Low index of suspicion for C. difficile in the absence of abdominal pain or leukocytosis.  Diarrhea has now resolved and Flexi-Seal discontinued -patient having daily bowel movements -Continue TNA until patient is able to increase intake -s/p bedside debridement by general surgery on  Leukocytosis -WBC up to 26.7 -> improved now to  18.1 -Currently afebrile -may be secondary to steroids- however being tapered -will obtain CXR (improved pulm vascular congestion and r basilar atelectasis), CT abd (small volume extraperitoneal free gas in central mesentery, immediately adjacent to the small bowel anastomosis.  No gross free intraperitoneal gas and no substantial intraperitoneal free fluid.  See report.  -Blood cx pending, urine cx pending -patient completed course of zosyn -per surgery note, appears to have abscess that drained out of wound, no plan for intervention.  Palliative care c/s.  Hold abx for now. -Continue to monitor CBC   Diarrhea -As above, Has now resolved. -Suspect related to resolving postoperative ileus  Acute lower GI bleed -As per surgery, felt to be due to staple line -Heparin was discontinued, currently on SCDs for DVT prophylaxis -GI bleed has resolved -Relatively stable, continue to monitor  -Continue to monitor CBC  Acute kidney injury on chronic kidney disease, stage III -Baseline creatinine approximately 1.5-1.8, peaked to 3.2 in the setting of septic shock, ATN and ischemic bowel -Creatinine currently 1.96 - patient may have [name] new baseline -Continue to monitor BMP  Hypophosphatemia/hypomagnesemia -Continue to monitor, appears improved  Hypernatremia - worsened to 156 today -secondary to TPN -pharmacy has adjusted TPN -will start d5 water at 50 cc/hr -lasix on hold -continue to monitor BMP  Non-anion gap metabolic acidosis -Due to diarrhea and mild hyperchloremia -Resolved with sodium bicarbonate  Acute on chronic systolic CHF -Echocardiogram 08/19/2018 shows an EF of 30%.  Inferior basal and inferoseptal akinesis.  Systolic function indeterminate. -Was off of diuretics, ACE inhibitor in the setting of acute kidney injury and septic shock -Patient developed progressive dyspnea and wheezing on 08/31/2018.  Thought to be due to fluids as well as TNA -Patient was given IV  Lasix on 09/01/2018 with good urine output -Continue to monitor intake and output, daily weights  Acute on chronic anemia -Patient did receive 1 unit PRBC this hospitalization -Anemia panel suggestive of anemia of chronic disease -Hemoglobin currently 8.5 -Continue to monitor CBC  Paroxysmal atrial fibrillation/transient NSVT -Status post DC cardioversion 08/20/2018, currently in sinus rhythm -No anticoagulation given GI bleeding issues -Continue amiodarone, Coreg -She will need to follow-up with cardiology, Dr. [name]  Severe protein calorie malnutrition -Currently on TNA and dysphagia 1 diet -speech therapy following  CAD -Complaints of chest pain -Continue Coreg  Thrombocytopenia -recovered and resolved  Hyperglycemia/ prediabetes -Suspect secondary to steroids and tapering. -Continue insulin sliding scale and CBG monitoring -Hemoglobin A1c 5.8  Physical deconditioning -Likely secondary to acute illness, prolonged hospitalization, underlying medical problems -PT and OT recommending SNF  Delirium: may have delirium related to hypernatremia, hospitalization.  She is hard of hearing and this likely plays Heyden Jaber role as well.  Was able to pay attention and answer some questions appropriately, but occasional odd responses as well. Delirium prec, follow  DVT prophylaxis: SCD Code Status: full  Family Communication: none at bedside - discussed with husband Disposition Plan: pending  Consultants:   Palliative care  Surgery  Cardiology  Procedures:  Cardioversion 08/20/2018  Ex laparotomy with lysis of adhesion, bowel resection for ischemic bowel  Echo IMPRESSIONS    1. The left ventricle has Trayon Krantz visually estimated ejection fraction of approximately 30%. There is inferior basal and inferoseptal akinesis. The cavity size and wall thickness are normal. Diastolic function is indeterminate.  2. The right ventricle has normal systolic function. The cavity was  normal. There is no increase in right ventricular  wall thickness. Device wire noted in right ventricle. RV-RA gradient 25.9 mmHg, unable to estimate RVSP.  3. Left atrial size was moderately dilated.  4. The aortic valve is tricuspid. Aortic valve regurgitation is trivial by color flow Doppler. Mild aortic annular calcification noted.  5. The mitral valve is grossly normal. There is mild mitral annular calcification present.  6. The aortic root is normal in size and structure.  7. The tricuspid valve is grossly normal.  8. The pericardial effusion is circumferential.  9. Small to moderate pericardial effusion with associated adipose tissue/organization.  Antimicrobials: Anti-infectives (From admission, onward)   Start     Dose/Rate Route Frequency Ordered Stop   08/23/18 1400  piperacillin-tazobactam (ZOSYN) IVPB 2.25 g     2.25 g 100 mL/hr over 30 Minutes Intravenous Every 8 hours 08/23/18 0928 08/30/18 2230   08/21/18 1200  piperacillin-tazobactam (ZOSYN) IVPB 3.375 g  Status:  Discontinued     3.375 g 12.5 mL/hr over 240 Minutes Intravenous Every 8 hours 08/21/18 1102 08/23/18 0928   08/14/18 1000  hydroxychloroquine (PLAQUENIL) tablet 200 mg  Status:  Discontinued    Note to Pharmacy:  Take 1 tablet by mouth daily Monday through Friday only     200 mg Oral Once per day on Mon Tue Wed Thu Fri 08/14/18 0742 08/21/18 1147   08/13/18 2200  doxycycline (VIBRA-TABS) tablet 100 mg  Status:  Discontinued     100 mg Oral Every 12 hours 08/13/18 2134 08/13/18 2135   08/13/18 2200  doxycycline (VIBRAMYCIN) 100 mg in sodium chloride 0.9 % 250 mL IVPB  Status:  Discontinued     100 mg 125 mL/hr over 120 Minutes Intravenous Every 12 hours 08/13/18 2135 08/19/18 1702   08/13/18 2145  cefTRIAXone (ROCEPHIN) 1 g in sodium chloride 0.9 % 100 mL IVPB  Status:  Discontinued     1 g 200 mL/hr over 30 Minutes Intravenous Every 24 hours 08/13/18 2134 08/14/18 0743     Subjective: Difficult to  communicate with. Hard of hearing. Denies pain.    Objective: Vitals:   09/10/18 0525 09/10/18 1014 09/10/18 1118 09/10/18 1827  BP: (!) 112/56 (!) 126/49 123/61 132/66  Pulse: 67 78 (!) 50 79  Resp: 15 15 (!) 21   Temp: 98.2 F (36.8 C) 98 F (36.7 C) 98.3 F (36.8 C)   TempSrc: Oral Oral Oral   SpO2: 98% 95% 96%   Weight:      Height:        Intake/Output Summary (Last 24 hours) at 09/10/2018 1904 Last data filed at 09/10/2018 1600 Gross per 24 hour  Intake 897.49 ml  Output 800 ml  Net 97.49 ml   Filed Weights   09/02/18 0650 09/05/18 0325 09/09/18 0645  Weight: 73 kg 68.8 kg 62.6 kg    Examination:  General exam: Appears calm and comfortable  Respiratory system: Clear to auscultation. Respiratory effort normal. Cardiovascular system: S1 & S2 heard, RRR. Gastrointestinal system: Dressing clean and intact, no significant TTP Central nervous system: Alert, unable to tell if oriented, hard of hearing. No focal neurological deficits. Extremities: moving all extremities.  No lee.  Skin: No rashes, lesions or ulcers Psychiatry: Judgement and insight appear normal. Mood & affect appropriate.     Data Reviewed: I have personally reviewed following labs and imaging studies  CBC: Recent Labs  Lab 09/04/18 0511 09/04/18 0640  09/06/18 0500 09/07/18 0500 09/08/18 0500 09/09/18 0320 09/10/18 0545  WBC 11.8* 9.4  --   --   --  17.2* 26.7* 18.1*  NEUTROABS  --   --   --   --   --  13.4*  --   --   HGB 4.5* 7.1*   < > 8.1* 8.9* 8.5* 8.5* 7.9*  HCT 14.4* 22.6*   < > 25.9* 28.8* 28.0* 27.5* 26.7*  MCV 86.2 85.6  --   --   --  89.7 90.5 93.4  PLT 304 293  --   --   --  285 250 214   < > = values in this interval not displayed.   Basic Metabolic Panel: Recent Labs  Lab 09/04/18 0511  09/06/18 0500 09/07/18 0500 09/08/18 0500 09/09/18 0320 09/10/18 0545  NA 144   < > 151* 148* 152* 152* 156*  K 3.5   < > 3.6 3.7 3.8 4.4 4.2  CL 101   < > 104 103 105 107 111    CO2 27   < > 32 31 33* 31 30  GLUCOSE 189*   < > 155* 248* 99 197* 112*  BUN 153*   < > 160* 157* 161* 168* 154*  CREATININE 2.51*   < > 2.18* 2.08* 2.04* 2.01* 1.96*  CALCIUM 8.3*   < > 8.6* 8.6* 8.6* 8.5* 8.7*  MG 1.9  --   --   --  2.1  --  2.4  PHOS 5.0*  --   --   --  6.0*  --  5.0*   < > = values in this interval not displayed.   GFR: Estimated Creatinine Clearance: 20.1 mL/min (Makailee Nudelman) (by C-G formula based on SCr of 1.96 mg/dL (H)). Liver Function Tests: Recent Labs  Lab 09/04/18 0511 09/08/18 0500  AST 60* 49*  ALT 46* 66*  ALKPHOS 281* 174*  BILITOT 2.5* 3.6*  PROT 5.4* 5.8*  ALBUMIN 1.5* 1.8*   No results for input(s): LIPASE, AMYLASE in the last 168 hours. No results for input(s): AMMONIA in the last 168 hours. Coagulation Profile: No results for input(s): INR, PROTIME in the last 168 hours. Cardiac Enzymes: No results for input(s): CKTOTAL, CKMB, CKMBINDEX, TROPONINI in the last 168 hours. BNP (last 3 results) No results for input(s): PROBNP in the last 8760 hours. HbA1C: No results for input(s): HGBA1C in the last 72 hours. CBG: Recent Labs  Lab 09/09/18 1132 09/09/18 1654 09/10/18 0031 09/10/18 0649 09/10/18 1117  GLUCAP 240* 255* 206* 116* 202*   Lipid Profile: Recent Labs    09/08/18 0500  TRIG 118   Thyroid Function Tests: No results for input(s): TSH, T4TOTAL, FREET4, T3FREE, THYROIDAB in the last 72 hours. Anemia Panel: No results for input(s): VITAMINB12, FOLATE, FERRITIN, TIBC, IRON, RETICCTPCT in the last 72 hours. Sepsis Labs: No results for input(s): PROCALCITON, LATICACIDVEN in the last 168 hours.  Recent Results (from the past 240 hour(s))  Novel Coronavirus, NAA (hospital order; send-out to ref lab)     Status: None   Collection Time: 09/01/18 10:10 AM  Result Value Ref Range Status   SARS-CoV-2, NAA NOT DETECTED NOT DETECTED Final    Comment: (NOTE) This test was developed and its performance characteristics determined by  [employer]. This test has not been FDA cleared or approved. This test has been authorized by FDA under an Emergency Use Authorization (EUA). This test is only authorized for the duration of time the declaration that circumstances exist justifying the authorization of the emergency use of in vitro diagnostic tests for detection of SARS-CoV-2 virus and/or diagnosis of COVID-19 infection under  section 564(b)(1) of the Act, 21 U.S.C. 539JQB-3(Antoine Vandermeulen)(1), unless the authorization is terminated or revoked sooner. When diagnostic testing is negative, the possibility of Ka Flammer false negative result should be considered in the context of Ann-Marie Kluge patient's recent exposures and the presence of clinical signs and symptoms consistent with COVID-19. An individual without symptoms of COVID-19 and who is not shedding SARS-CoV-2 virus would expect to have Demetria Lightsey negative (not detected) result in this assay. Performed  At: [facility] [address] [place], [place] [id number] Rush Farmer MD BD:[id number]    [place]  Final    Comment: Performed at [facility], [place] [address] [place], [place] [address]  Culture, blood (routine x 2)     Status: None (Preliminary result)   Collection Time: 09/09/18  7:22 AM  Result Value Ref Range Status   Specimen Description BLOOD RIGHT ANTECUBITAL  Final   Special Requests   Final    BOTTLES DRAWN AEROBIC ONLY Blood Culture results may not be optimal due to an inadequate volume of blood received in culture bottles   Culture   Final    NO GROWTH 1 DAY Performed at [facility], [place] [address], [place], [place] [address]    Report Status PENDING  Incomplete  Culture, blood (routine x 2)     Status: None (Preliminary result)   Collection Time: 09/09/18  7:28 AM  Result Value Ref Range Status   Specimen Description BLOOD RIGHT HAND  Final   Special Requests   Final    BOTTLES DRAWN AEROBIC ONLY Blood Culture adequate volume    Culture   Final    NO GROWTH 1 DAY Performed at [facility], [place] [address] [place], [place] [address]    Report Status PENDING  Incomplete  Culture, Urine     Status: None (Preliminary result)   Collection Time: 09/09/18 10:30 AM  Result Value Ref Range Status   Specimen Description URINE, CLEAN CATCH  Final   Special Requests NONE  Final   Culture   Final    CULTURE REINCUBATED FOR BETTER GROWTH Performed at [facility], [place] [address] [place], [place] [address]    Report Status PENDING  Incomplete         Radiology Studies: Ct Abdomen Pelvis Wo Contrast  Result Date: 09/09/2018 CLINICAL DATA:  Decreased appetite. Leukocytosis. Recent small bowel resection and adhesiolysis on 08/21/2018. EXAM: CT ABDOMEN AND PELVIS WITHOUT CONTRAST TECHNIQUE: Multidetector CT imaging of the abdomen and pelvis was performed following the standard protocol without IV contrast. COMPARISON:  08/19/2018 FINDINGS: Lower chest: Dependent collapse/consolidation in both lower lobes. Hepatobiliary: No focal abnormality in the liver on this study without intravenous contrast. Tiny calcified gallstones evident. No intrahepatic or extrahepatic biliary dilation. Pancreas: No focal mass lesion. No dilatation of the main duct. No intraparenchymal cyst. No peripancreatic edema. Spleen: No splenomegaly. No focal mass lesion. Adrenals/Urinary Tract: No adrenal nodule or mass. 2.1 cm low-density lesion lower pole right kidney is stable and compatible with cyst. Left kidney unremarkable on this noncontrast study. No evidence for hydroureter. The urinary bladder appears normal for the degree of distention. Stomach/Bowel: Stomach is unremarkable. No gastric wall thickening. No evidence of outlet obstruction. Duodenum is normally positioned as is the ligament of Treitz. No small bowel wall thickening. No small bowel dilatation. Small bowel anastomosis noted right abdomen. The terminal ileum is normal. No gross  colonic mass. No colonic wall thickening. Vascular/Lymphatic: There is abdominal aortic  atherosclerosis without aneurysm. There is no gastrohepatic or hepatoduodenal ligament lymphadenopathy. No intraperitoneal or retroperitoneal lymphadenopathy. No pelvic sidewall lymphadenopathy. Reproductive: Unremarkable Other: Maycen Degregory small amount of intraperitoneal free gas is identified (see image 60/series 3). Coronal imaging demonstrates Edmundo Tedesco small gas locule immediately adjacent to the small bowel anastomosis (coronal image 42/series 6). There is some minimal edema in the mesentery adjacent to the gas bubbles, but no substantial fluid is identified in the cul-de-sac or para colic gutters. Musculoskeletal: Midline anterior abdominal wall open wound evident. No worrisome lytic or sclerotic osseous abnormality. IMPRESSION: 1. Small volume extraperitoneal free gas identified in the central mesentery, immediately adjacent to the small bowel anastomosis. No gross free intraperitoneal gas and there is no substantial intraperitoneal free fluid. Residual extraluminal gas is not expected more than about 10 days out from surgery and despite the absence of intraperitoneal free fluid, anastomotic leak cannot be excluded. 2. Cholelithiasis. 3.  Aortic Atherosclerois (ICD10-170.0) Electronically Signed   By: Misty Stanley M.D.   On: 09/09/2018 13:46   Dg Chest 1 View  Result Date: 09/09/2018 CLINICAL DATA:  Leukocytosis. EXAM: CHEST  1 VIEW COMPARISON:  Chest x-ray dated Sep 01, 2018. FINDINGS: Unchanged left chest wall pacemaker. Unchanged right internal jugular central venous catheter. Stable mild cardiomegaly. Atherosclerotic calcification of the aortic arch. Improved pulmonary vascular congestion. Persistent bibasilar atelectasis, improved on the right. No pleural effusion or pneumothorax. No acute osseous abnormality. IMPRESSION: Improved pulmonary vascular congestion and right basilar atelectasis. Electronically Signed   By: Titus Dubin M.D.   On: 09/09/2018 11:30        Scheduled Meds:  amiodarone  200 mg Oral Daily   carvedilol  3.125 mg Oral BID WC   chlorhexidine  15 mL Mouth Rinse BID   feeding supplement (ENSURE ENLIVE)  237 mL Oral BID BM   furosemide  40 mg Intravenous Daily   insulin aspart  0-20 Units Subcutaneous Q6H   mouth rinse  15 mL Mouth Rinse q12n4p   methylPREDNISolone (SOLU-MEDROL) injection  40 mg Intravenous Daily   mupirocin ointment   Nasal BID   pantoprazole (PROTONIX) IV  40 mg Intravenous QHS   saccharomyces boulardii  250 mg Oral BID   Continuous Infusions:  TPN ADULT (ION) 30 mL/hr at 09/10/18 1734     LOS: 28 days    Time spent: over 30 min    Fayrene Helper, MD [facility] Pager AMION  If 7PM-7AM, please contact night-coverage [email or web address] Password [email or web address] 09/10/2018, 7:04 PM

## 2018-09-10 NOTE — Progress Notes (Signed)
Occupational Therapy Treatment Patient Details Name: [name] MRN: [id number] DOB: 08-01-1940 Today's Date: 09/10/2018    History of present illness Pt is a 78 year old female with significant cardiac history that is post exploratory laparotomy for small bowel obstruction with associated ischemic bowel on 4/30, return to the intensive care on mechanical ventilator, critical care asked to assist with care.  VDRF 4/30-5/3.  Also PNA.  SMO:[id number] heart failure with EF 30%, nonsustained VT, pericardial effusion CAD w/ prior AICD, diastolic heart failure, CKD stage III, hypertension, rheumatoid arthritis, anemia of chronic disease.   OT comments  Overall decrease in cognition, increased lethargy and fatigue. Pt able to sit EOB for approx 10 min with varied level of assist (min to max A to address posterior lean), and participate in AAROM exercises for BLE and BUE at EOB as well as simple grooming tasks with mod A. Pt responded well to upbeat and overly enthusiastic attitudes and was pleasantly confused throughout the session. Current POC remains appropriate. OT will continue to follow acutely.    Follow Up Recommendations  SNF;Supervision/Assistance - 24 hour    Equipment Recommendations  Other (comment)(defer to next venue of care)    Recommendations for Other Services      Precautions / Restrictions Precautions Precautions: Fall Restrictions Weight Bearing Restrictions: No       Mobility Bed Mobility Overal bed mobility: Needs Assistance Bed Mobility: Supine to Sit;Sit to Supine     Supine to sit: HOB elevated;Mod assist;+2 for safety/equipment Sit to supine: Mod assist;+2 for safety/equipment   General bed mobility comments: increased time and effort, multimodal cueing to complete task, assistance for bilateral LE movement off of and back onto bed, assistance needed for trunk management as well  Transfers Overall transfer level: Needs assistance Equipment used: 2 person  hand held assist Transfers: Sit to/from Stand Sit to Stand: Total assist;+2 physical assistance         General transfer comment: assistance needed for bilateral LE positioning prior to standing; heavy physical assistance needed to achieve partial standing position at EOB    Balance Overall balance assessment: Needs assistance Sitting-balance support: Feet supported Sitting balance-Leahy Scale: Poor Sitting balance - Comments: fluctuated between requiring min A to max A with posterior trunk lean   Standing balance support: Bilateral upper extremity supported Standing balance-Leahy Scale: Zero Standing balance comment: total A x2                           ADL either performed or assessed with clinical judgement   ADL Overall ADL's : Needs assistance/impaired     Grooming: Wash/dry face;Moderate assistance;Sitting Grooming Details (indicate cue type and reason): EOB, she does not complete tasks - she will bring the washcloth to her face - but requires assist for thoroughness                 Toilet Transfer: Maximal assistance;+2 for physical assistance;+2 for safety/equipment Toilet Transfer Details (indicate cue type and reason): simulated through moving up the bed Toileting- Clothing Manipulation and Hygiene: Total assistance;Sit to/from stand       Functional mobility during ADLs: Maximal assistance;+2 for physical assistance;+2 for safety/equipment       Vision       Perception     Praxis      Cognition Arousal/Alertness: Awake/alert Behavior During Therapy: WFL for tasks assessed/performed Overall Cognitive Status: Difficult to assess Area of Impairment: Following commands;Safety/judgement;Problem solving  Following Commands: Follows one step commands inconsistently Safety/Judgement: Decreased awareness of safety;Decreased awareness of deficits   Problem Solving: Slow processing;Decreased initiation;Difficulty  sequencing;Requires verbal cues;Requires tactile cues General Comments: attempted to communicate via white board, and she would not respond appropriately to attempts        Exercises Exercises: General Lower Extremity General Exercises - Lower Extremity Heel Slides: AROM;AAROM;Both;10 reps;Supine Other Exercises Other Exercises: AAROM B shoulders supine in bed   Shoulder Instructions       General Comments      Pertinent Vitals/ Pain       Pain Assessment: Faces Pain Score: 2  Faces Pain Scale: Hurts little more Pain Location: generalized discomfort; and abdomen Pain Descriptors / Indicators: Discomfort Pain Intervention(s): Monitored during session;Repositioned;Limited activity within patient's tolerance  Home Living                                          Prior Functioning/Environment              Frequency  Min 2X/week        Progress Toward Goals  OT Goals(current goals can now be found in the care plan section)  Progress towards OT goals: Not progressing toward goals - comment(decreased cognition, activity tolerance, balance, strength)  Acute Rehab OT Goals Patient Stated Goal: none stated today OT Goal Formulation: Patient unable to participate in goal setting Time For Goal Achievement: 09/24/18 Potential to Achieve Goals: [place] Discharge plan remains appropriate    Co-evaluation    PT/OT/SLP Co-Evaluation/Treatment: Yes Reason for Co-Treatment: Complexity of the patient's impairments (multi-system involvement);For patient/therapist safety;To address functional/ADL transfers PT goals addressed during session: Mobility/safety with mobility;Balance;Strengthening/ROM OT goals addressed during session: ADL's and self-care;Strengthening/ROM      AM-PAC OT "6 Clicks" Daily Activity     Outcome Measure   Help from another person eating meals?: A Lot Help from another person taking care of personal grooming?: A Lot Help from  another person toileting, which includes using toliet, bedpan, or urinal?: Total Help from another person bathing (including washing, rinsing, drying)?: A Lot Help from another person to put on and taking off regular upper body clothing?: A Lot Help from another person to put on and taking off regular lower body clothing?: Total 6 Click Score: 10    End of Session    OT Visit Diagnosis: Unsteadiness on feet (R26.81);Other abnormalities of gait and mobility (R26.89);Muscle weakness (generalized) (M62.81);Other symptoms and signs involving cognitive function   Activity Tolerance Patient tolerated treatment well   Patient Left in bed;with call bell/phone within reach;with bed alarm set;with SCD's reapplied   Nurse Communication Mobility status        Time: 5597-4163 OT Time Calculation (min): 25 min  Charges: OT General Charges $OT Visit: 1 Visit OT Treatments $Self Care/Home Management : 8-22 mins  Hulda Humphrey OTR/L Acute Rehabilitation Services Pager: [phone] Office: [place] 09/10/2018, 5:32 PM

## 2018-09-10 NOTE — Progress Notes (Signed)
Patient ID: Savannah MONSIVAIS, female   DOB: 1940/08/12, 78 y.o.   MRN: [id number]    20 Days Post-Op  Subjective: Patient with no new complaints.  Can't hear to understand anything I asked her.  Objective: Vital signs in last 24 hours: Temp:  [97.7 F (36.5 C)-98.3 F (36.8 C)] 98.2 F (36.8 C) (05/20 0525) Pulse Rate:  [67-89] 67 (05/20 0525) Resp:  [14-22] 15 (05/20 0525) BP: (105-134)/(34-56) 112/56 (05/20 0525) SpO2:  [94 %-100 %] 98 % (05/20 0525) Last BM Date: 09/09/18  Intake/Output from previous day: 05/19 0701 - 05/20 0700 In: 1107.5 [I.V.:1107.5] Out: 800 [Urine:800] Intake/Output this shift: No intake/output data recorded.  PE: Abd: soft, midline wound dehisced and bowel is present.  Some tan thick drainage at base of the wound.  Smells more like nonfeculent drainage than stool or enteric contents.  It is not bilious.  mepitel in wound and wound repacked.     Lab Results:  Recent Labs    09/09/18 0320 09/10/18 0545  WBC 26.7* 18.1*  HGB 8.5* 7.9*  HCT 27.5* 26.7*  PLT 250 214   BMET Recent Labs    09/09/18 0320 09/10/18 0545  NA 152* 156*  K 4.4 4.2  CL 107 111  CO2 31 30  GLUCOSE 197* 112*  BUN 168* 154*  CREATININE 2.01* 1.96*  CALCIUM 8.5* 8.7*   PT/INR No results for input(s): LABPROT, INR in the last 72 hours. CMP     Component Value Date/Time   NA 156 (H) 09/10/2018 0545   K 4.2 09/10/2018 0545   CL 111 09/10/2018 0545   CO2 30 09/10/2018 0545   GLUCOSE 112 (H) 09/10/2018 0545   BUN 154 (H) 09/10/2018 0545   CREATININE 1.96 (H) 09/10/2018 0545   CREATININE 1.07 (H) 02/13/2018 1142   CALCIUM 8.7 (L) 09/10/2018 0545   PROT 5.8 (L) 09/08/2018 0500   ALBUMIN 1.8 (L) 09/08/2018 0500   AST 49 (H) 09/08/2018 0500   ALT 66 (H) 09/08/2018 0500   ALKPHOS 174 (H) 09/08/2018 0500   BILITOT 3.6 (H) 09/08/2018 0500   GFRNONAA 24 (L) 09/10/2018 0545   GFRNONAA 49 (L) 02/13/2018 1142   GFRAA 28 (L) 09/10/2018 0545   GFRAA 57 (L) 02/13/2018  1142   Lipase     Component Value Date/Time   LIPASE 30 08/13/2018 1926       Studies/Results: Ct Abdomen Pelvis Wo Contrast  Result Date: 09/09/2018 CLINICAL DATA:  Decreased appetite. Leukocytosis. Recent small bowel resection and adhesiolysis on 08/21/2018. EXAM: CT ABDOMEN AND PELVIS WITHOUT CONTRAST TECHNIQUE: Multidetector CT imaging of the abdomen and pelvis was performed following the standard protocol without IV contrast. COMPARISON:  08/19/2018 FINDINGS: Lower chest: Dependent collapse/consolidation in both lower lobes. Hepatobiliary: No focal abnormality in the liver on this study without intravenous contrast. Tiny calcified gallstones evident. No intrahepatic or extrahepatic biliary dilation. Pancreas: No focal mass lesion. No dilatation of the main duct. No intraparenchymal cyst. No peripancreatic edema. Spleen: No splenomegaly. No focal mass lesion. Adrenals/Urinary Tract: No adrenal nodule or mass. 2.1 cm low-density lesion lower pole right kidney is stable and compatible with cyst. Left kidney unremarkable on this noncontrast study. No evidence for hydroureter. The urinary bladder appears normal for the degree of distention. Stomach/Bowel: Stomach is unremarkable. No gastric wall thickening. No evidence of outlet obstruction. Duodenum is normally positioned as is the ligament of Treitz. No small bowel wall thickening. No small bowel dilatation. Small bowel anastomosis noted right abdomen. The terminal  ileum is normal. No gross colonic mass. No colonic wall thickening. Vascular/Lymphatic: There is abdominal aortic atherosclerosis without aneurysm. There is no gastrohepatic or hepatoduodenal ligament lymphadenopathy. No intraperitoneal or retroperitoneal lymphadenopathy. No pelvic sidewall lymphadenopathy. Reproductive: Unremarkable Other: A small amount of intraperitoneal free gas is identified (see image 60/series 3). Coronal imaging demonstrates a small gas locule immediately adjacent  to the small bowel anastomosis (coronal image 42/series 6). There is some minimal edema in the mesentery adjacent to the gas bubbles, but no substantial fluid is identified in the cul-de-sac or para colic gutters. Musculoskeletal: Midline anterior abdominal wall open wound evident. No worrisome lytic or sclerotic osseous abnormality. IMPRESSION: 1. Small volume extraperitoneal free gas identified in the central mesentery, immediately adjacent to the small bowel anastomosis. No gross free intraperitoneal gas and there is no substantial intraperitoneal free fluid. Residual extraluminal gas is not expected more than about 10 days out from surgery and despite the absence of intraperitoneal free fluid, anastomotic leak cannot be excluded. 2. Cholelithiasis. 3.  Aortic Atherosclerois (ICD10-170.0) Electronically Signed   By: Misty Stanley M.D.   On: 09/09/2018 13:46   Dg Chest 1 View  Result Date: 09/09/2018 CLINICAL DATA:  Leukocytosis. EXAM: CHEST  1 VIEW COMPARISON:  Chest x-ray dated Sep 01, 2018. FINDINGS: Unchanged left chest wall pacemaker. Unchanged right internal jugular central venous catheter. Stable mild cardiomegaly. Atherosclerotic calcification of the aortic arch. Improved pulmonary vascular congestion. Persistent bibasilar atelectasis, improved on the right. No pleural effusion or pneumothorax. No acute osseous abnormality. IMPRESSION: Improved pulmonary vascular congestion and right basilar atelectasis. Electronically Signed   By: Titus Dubin M.D.   On: 09/09/2018 11:30    Anti-infectives: Anti-infectives (From admission, onward)   Start     Dose/Rate Route Frequency Ordered Stop   08/23/18 1400  piperacillin-tazobactam (ZOSYN) IVPB 2.25 g     2.25 g 100 mL/hr over 30 Minutes Intravenous Every 8 hours 08/23/18 0928 08/30/18 2230   08/21/18 1200  piperacillin-tazobactam (ZOSYN) IVPB 3.375 g  Status:  Discontinued     3.375 g 12.5 mL/hr over 240 Minutes Intravenous Every 8 hours 08/21/18  1102 08/23/18 0928   08/14/18 1000  hydroxychloroquine (PLAQUENIL) tablet 200 mg  Status:  Discontinued    Note to Pharmacy:  Take 1 tablet by mouth daily Monday through Friday only     200 mg Oral Once per day on Mon Tue Wed Thu Fri 08/14/18 0742 08/21/18 1147   08/13/18 2200  doxycycline (VIBRA-TABS) tablet 100 mg  Status:  Discontinued     100 mg Oral Every 12 hours 08/13/18 2134 08/13/18 2135   08/13/18 2200  doxycycline (VIBRAMYCIN) 100 mg in sodium chloride 0.9 % 250 mL IVPB  Status:  Discontinued     100 mg 125 mL/hr over 120 Minutes Intravenous Every 12 hours 08/13/18 2135 08/19/18 1702   08/13/18 2145  cefTRIAXone (ROCEPHIN) 1 g in sodium chloride 0.9 % 100 mL IVPB  Status:  Discontinued     1 g 200 mL/hr over 30 Minutes Intravenous Every 24 hours 08/13/18 2134 08/14/18 0743       Assessment/Plan POD20, s/p ex lapw/SBR, LOA, appendectomyfor SBO&ischemic/necroticbowel, 4/30 - PT -prealbumin somehow 34 today.  i'm not sure how this is accurate except for her TNA which usually doesn't increase prealbumin that quickly.  She isn't taking in much.  Calorie count in place for 48hrs. -cont diet and supplementation, but will decrease TNA to 1/2 rate as she seemed to take in 50% of her needs  based off of her initial calorie count -JPremoved -completed zosyn x10 days, patient afebrile, WBC trending back down to 18, will follow - Mobilize and IS -wound remains dehisced, with some evisceration, but seems stuck for now.  Will place mepitel over wound then dressing change -CT Abd/pel relatively unimpressive.    Acute on chronic renal failure- Cr 1.96  ICD/CAD/CHF-per cardiology,on amio (oral)  Acute Respiratory Failure-resolved.   ABL Anemia-hgb stable  FEN - 1/2 rateTPN,DYS1 (AAT per speech) VTE - SCDs, Mobilize as tolerating with PT. ID -zosyn 4/30>>5/9.cont to hold abx as WBC trending down Foley - Removed 5/7 Follow up - Dr. Marlou Starks  Had a long  discussion with patient's husband, [name], today.  HE is only available due to his work schedule between 8-830am.  I discussed with him Shilynn's decrease in oral intake and that long-term TNA is not an adequate solution if she is unable to provide all of her nutritional support.  We also discussed what her wound looks like and the concern if she were to develop any worsening complication such as a fistula.  We discussed that she can not return to the OR at this point as she is "stuck" and her bowels are scarred in.  I relayed my overall concern for her prognosis which is quite guarded at this time.  I discussed a palliative care consult to discuss goals of care with him. He is very agreeable to that.  He is very kind and expresses his gratitude for taking the time to call him with an update.  I will place the order for the palliative care consult and see how she does over the next little bit.   LOS: 28 days    [name] , [facility] 09/10/2018, 8:33 AM Pager: [phone]

## 2018-09-11 ENCOUNTER — Ambulatory Visit: Payer: Self-pay | Admitting: Rheumatology

## 2018-09-11 DIAGNOSIS — Z7189 Other specified counseling: Secondary | ICD-10-CM

## 2018-09-11 DIAGNOSIS — Z515 Encounter for palliative care: Secondary | ICD-10-CM

## 2018-09-11 LAB — BASIC METABOLIC PANEL
Anion gap: 11 (ref 5–15)
BUN: 121 mg/dL — ABNORMAL HIGH (ref 8–23)
CO2: 27 mmol/L (ref 22–32)
Calcium: 8.2 mg/dL — ABNORMAL LOW (ref 8.9–10.3)
Chloride: 110 mmol/L (ref 98–111)
Creatinine, Ser: 1.83 mg/dL — ABNORMAL HIGH (ref 0.44–1.00)
GFR calc Af Amer: 30 mL/min — ABNORMAL LOW (ref >60)
GFR calc non Af Amer: 26 mL/min — ABNORMAL LOW (ref >60)
Glucose, Bld: 399 mg/dL — ABNORMAL HIGH (ref 70–99)
Potassium: 4.5 mmol/L (ref 3.5–5.1)
Sodium: 148 mmol/L — ABNORMAL HIGH (ref 135–145)

## 2018-09-11 LAB — COMPREHENSIVE METABOLIC PANEL
ALT: 56 U/L — ABNORMAL HIGH (ref 0–44)
AST: 41 U/L (ref 15–41)
Albumin: 1.8 g/dL — ABNORMAL LOW (ref 3.5–5.0)
Alkaline Phosphatase: 155 U/L — ABNORMAL HIGH (ref 38–126)
Anion gap: 14 (ref 5–15)
BUN: 139 mg/dL — ABNORMAL HIGH (ref 8–23)
CO2: 29 mmol/L (ref 22–32)
Calcium: 8.8 mg/dL — ABNORMAL LOW (ref 8.9–10.3)
Chloride: 112 mmol/L — ABNORMAL HIGH (ref 98–111)
Creatinine, Ser: 1.91 mg/dL — ABNORMAL HIGH (ref 0.44–1.00)
GFR calc Af Amer: 28 mL/min — ABNORMAL LOW (ref >60)
GFR calc non Af Amer: 24 mL/min — ABNORMAL LOW (ref >60)
Glucose, Bld: 212 mg/dL — ABNORMAL HIGH (ref 70–99)
Potassium: 4.2 mmol/L (ref 3.5–5.1)
Sodium: 155 mmol/L — ABNORMAL HIGH (ref 135–145)
Total Bilirubin: 4.1 mg/dL — ABNORMAL HIGH (ref 0.3–1.2)
Total Protein: 5.7 g/dL — ABNORMAL LOW (ref 6.5–8.1)

## 2018-09-11 LAB — CBC
HCT: 27.9 % — ABNORMAL LOW (ref 36.0–46.0)
Hemoglobin: 8.4 g/dL — ABNORMAL LOW (ref 12.0–15.0)
MCH: 28.3 pg (ref 26.0–34.0)
MCHC: 30.1 g/dL (ref 30.0–36.0)
MCV: 93.9 fL (ref 80.0–100.0)
Platelets: 194 10*3/uL (ref 150–400)
RBC: 2.97 MIL/uL — ABNORMAL LOW (ref 3.87–5.11)
RDW: 22.2 % — ABNORMAL HIGH (ref 11.5–15.5)
WBC: 15 10*3/uL — ABNORMAL HIGH (ref 4.0–10.5)
nRBC: 0 % (ref 0.0–0.2)

## 2018-09-11 LAB — GLUCOSE, CAPILLARY
Glucose-Capillary: 176 mg/dL — ABNORMAL HIGH (ref 70–99)
Glucose-Capillary: 151 mg/dL — ABNORMAL HIGH (ref 70–99)
Glucose-Capillary: 298 mg/dL — ABNORMAL HIGH (ref 70–99)
Glucose-Capillary: 221 mg/dL — ABNORMAL HIGH (ref 70–99)
Glucose-Capillary: 349 mg/dL — ABNORMAL HIGH (ref 70–99)

## 2018-09-11 LAB — MAGNESIUM: Magnesium: 2.4 mg/dL (ref 1.7–2.4)

## 2018-09-11 LAB — PHOSPHORUS: Phosphorus: 5.3 mg/dL — ABNORMAL HIGH (ref 2.5–4.6)

## 2018-09-11 MED ORDER — DEXTROSE 5 % IV SOLN
INTRAVENOUS | Status: AC
  Administered 2018-09-11 (×2): via INTRAVENOUS

## 2018-09-11 MED ORDER — INSULIN ASPART 100 UNIT/ML ~~LOC~~ SOLN
0.0000 [IU] | SUBCUTANEOUS | Status: DC
  Administered 2018-09-11: 7 [IU] via SUBCUTANEOUS
  Administered 2018-09-11: 11 [IU] via SUBCUTANEOUS
  Administered 2018-09-12: 18:00:00 4 [IU] via SUBCUTANEOUS
  Administered 2018-09-12: 7 [IU] via SUBCUTANEOUS
  Administered 2018-09-12: 4 [IU] via SUBCUTANEOUS
  Administered 2018-09-12: 07:00:00 7 [IU] via SUBCUTANEOUS
  Administered 2018-09-13: 3 [IU] via SUBCUTANEOUS
  Administered 2018-09-13: 15:00:00 7 [IU] via SUBCUTANEOUS
  Administered 2018-09-13: 4 [IU] via SUBCUTANEOUS
  Administered 2018-09-13: 7 [IU] via SUBCUTANEOUS
  Administered 2018-09-14: 02:00:00 3 [IU] via SUBCUTANEOUS
  Administered 2018-09-14: 14:00:00 7 [IU] via SUBCUTANEOUS

## 2018-09-11 MED ORDER — METHYLPREDNISOLONE SODIUM SUCC 40 MG IJ SOLR: 30.0000 mg | Freq: Every day | INTRAMUSCULAR | Status: DC

## 2018-09-11 MED ORDER — HEPARIN SODIUM (PORCINE) 5000 UNIT/ML IJ SOLN
5000.0000 [IU] | Freq: Three times a day (TID) | INTRAMUSCULAR | Status: DC
  Administered 2018-09-11 – 2018-09-19 (×24): 5000 [IU] via SUBCUTANEOUS
  Filled 2018-09-11 (×22): qty 1

## 2018-09-11 MED ORDER — STERILE WATER FOR INJECTION IV SOLN
INTRAVENOUS | Status: AC
  Administered 2018-09-11: 17:00:00 via INTRAVENOUS
  Filled 2018-09-11: qty 249.6

## 2018-09-11 MED ORDER — METHYLPREDNISOLONE SODIUM SUCC 40 MG IJ SOLR
40.0000 mg | Freq: Every day | INTRAMUSCULAR | Status: DC
  Administered 2018-09-11 – 2018-09-14 (×4): 40 mg via INTRAVENOUS
  Filled 2018-09-11 (×4): qty 1

## 2018-09-11 NOTE — Progress Notes (Signed)
PHARMACY - ADULT TOTAL PARENTERAL NUTRITION CONSULT NOTE   Pharmacy Consult for TPN Indication: s/p ex lap with SBR for SBO and ischemic/necroticbowel  Patient Measurements: Height: 5' 4" (162.6 cm) Weight: 138 lb 0.1 oz (62.6 kg)(removed SCDs) IBW/kg (Calculated) : 54.7 TPN AdjBW (KG): 64.8 Body mass index is 23.69 kg/m.  Assessment:  78 year old female with PMH significant for anemia, CAD s/p AICD placement, breast cancer, CKD III, CHF, HLD, impaired hearing, and RA. She [facility] on 4/23 with CAP and SBO. CT on 4/28 show persistent SBO and patient was transferred to [facility] for surgery. On 4/30, an ex lap, lysis of adhesions and small bowel resection was performed. As of 5/4, Patient has been ~12 days without nutrition and pharmacy was consulted 5/4 for TPN with plan for continued bowel rest and NG until bowel function returns.  GI: NGT removed 5/5, JP drain none documented. PPI IV. Pre-albumin up 8.6 >> 33.2 >>34.4 (meeting protein goals). No N/V. +Flatus. LBM 5/18. Dyphagia 1 diet -Calorie count initiated x48 hours.  Endo: No history of diabetes. CBGs overall improving with reduced steroids - 112 low, up to 255 after AM steroid dose yesterday. Insulin requirements in the past 24 hours: 18 units of resistant SSI Lytes: Na 155 (sodium removed from TPN - this along with high BUN could be indicative of dehydration on IV Lasix); K 4.2; Phos elevated at 5.3(removed from TPN), Mg 2.4.  Renal: Scr 1.91(trending down), off CRRT - no plan for dialysis. BUN down to 139 (protein reduced in TPN). UOP 0.5 mL/kg/hr.  Pulm: Extubated on 5/3. Required BiPAP 5/12, now RA. Steroids weaning but CBGs still elevatyed + Lasix. Cards: Amiodarone po (s/p VT arrest earlier in admission); V-paced at 70, BP ok. Hepatobil: AST down to 41, ALT down 56. Tbili up 3.6>4.1. Alk Phos down to 174>155. TG 118. Neuro: oriented to person only, complains of abdominal pain ID: WBC down to 15 (steroids decreasing), afebrile  off Zosyn. New blood and urine cultures re-sent 5/19.  TPN Access: CVC triple lumen placed 4/30 TPN start date: 5/4 Nutritional Goals (per RD recommendation on 5/19): Kcal: 2000-2200 kcal Protein:  100-120 grams (decreased to 75 gms or 1 gm/kg due to rising BUN) Fluid:  >/= 2 L/day   Goal TPN rate is 60 ml/hr (provides 75 g of protein, 288 g of dextrose, and 60 g of lipids which provides 2002 kCals per day, meeting 100% of patient needs) -- reduced protein to 1 gm/kg due to rising BUN and concentrated to reduce volume.   Current Nutrition:  Dysphagia 1, thin liquid diet + Magic cup + Ensure Calorie Count: 53% kcal needs and 39% protein needs on 5/18 TPN at goal  Plan:  Continue TPN at half rate at 30 mL/hr per Surgery This TPN provides  37.4g of protein, 144g of dextrose, and 30g of lipids which provides 938 kCals per day, meeting 37% of protein needs and  47% of calorie needs.  Electrolytes in TPN: Continue to remove Na, Phos, and Ca. Continue current K and Mg, maximize Cl (gives 1:1.1 ratio based on low lytes in TPN) Add MVI to TPN - trace elements are on back order, will only place in TPN on Monday, Wednesday and Friday Continue resistant SSI Monitor TPN labs- repeat BMET, Mg and Phos in AM Follow-up diet/calorie counts and ability to wean TPN  Levester Fresh, PharmD, BCPS, BCCCP Clinical Pharmacist [phone]  Please check AMION for all [place] numbers  09/11/2018 8:43 AM

## 2018-09-11 NOTE — Care Management Important Message (Signed)
Important Message  Patient Details  Name: [name] MRN: [id number] Date of Birth: Mar 04, 1941   Medicare Important Message Given:  Yes    Eon Zunker [address] 09/11/2018, 11:04 AM

## 2018-09-11 NOTE — Progress Notes (Addendum)
PROGRESS NOTE    [name]  IZT:[id number] DOB: 1941-03-28 DOA: 08/13/2018 PCP: Savannah Fire, MD   Brief Narrative:  HPI on 08/13/2018 by Dr. Bernadene Person [name] 78 y.o.femalewith medical history significant ofanemia chronic disease, ASCVD, AICD placement, history of breast cancer, chronic bronchitis, stage III chronic renal disease, systolic heart failure, hyperlipidemia, GERD, history of GI bleed, gout, hypertension, rheumatoid arthritis, impaired hearing, history of UTI who is coming to the emergency department with complaints of abdominal pain apparently for the past 3 days associated with decreased appetite, several episodes of diarrhea, nausea, but no emesis. She denies fever, cough, sore throat, chest pain, dysuria, frequency, hematuria, melena or hematochezia. She denies polyuria, polydipsia, polyphagia or blurred vision. History is limited by the patient severe hearing impairment.  Interim history Patient was admitted with small bowel obstruction, failed conservative management and was transferred to [name].  General surgery consulted and appreciated, patient underwent ex lap with lysis of adhesions, bowel resection for ischemic bowel.  She continued on the ventilator was put in ICU as she was also hypotensive and started on pressors.  Patient was also shocked for VT.  She developed progressive renal failure, oliguria.  She was weaned off of pressors and extubated. And [facility] assumed care. Started on antibiotics for possible pneumonia.  Patient now with prolonged postop ileus, ongoing TNA, diet gradually being advanced per speech therapy.  She was also noted to have [name] self-limiting GI bleed.  Patient had acute respiratory distress on 09/01/2018 with decompensated CHF and upper airway wheezing.  PCCM consulted.  Patient with spike in leukocytosis. Will pan-culture, obtain CXR, CT abd, etc. Surgery still following.   Assessment & Plan:    Principal Problem:   CAP (community acquired pneumonia) Active Problems:   Dyslipidemia   Anemia of chronic disease   CAD S/P remote PCI- no details   Chronic combined systolic and diastolic CHF (congestive heart failure) (HCC)   AKI (acute kidney injury) (HCC)   SBO (small bowel obstruction) (HCC)   Pressure injury of skin   Acute renal failure superimposed on stage 3 chronic kidney disease (HCC)   Wide-complex tachycardia (HCC)   Protein-calorie malnutrition, severe   Acute respiratory failure with hypoxia (HCC)   Goals of care, counseling/discussion   Palliative care by specialist   Acute respiratory failure with hypoxia -Noted on 09/01/2018.  Suspected to be due to decompensated CHF and [name] component of bronchospasm. -PCCM consulted, patient was briefly placed on BiPAP for 3 to 4 hours, given IV Lasix and Solu-Medrol -Has now improved, lasix on hold with hypernatremia below.  Tapering steroids at this time- Continue solumedrol to 40mg  IV daily.  Will need to continue taper.  Continuing Bronchodilators and BiPAP as needed  Septic shock -Resolved and stable -Status post VDRF and pressure support.  Patient extubated on 08/24/2018.  Weaned off pressors on 08/23/2018  Small bowel obstruction, ischemic colon /Postop ileus -Patient failed conservative management of bowel obstruction at [facility] -General surgery consulted and appreciated -Status post expiratory laparotomy for SBO and small bowel section of ischemic necrotic bowel -Completed 10 days of IV Zosyn -NG tube was discontinued -Wound VAC removed on 08/27/2018.  Wound is dehisced.  Continue mepitel and wet to dry dressing changes per surgery. -Currently on dysphagia 1 diet and tolerating - follow calorie count -Noted to have diarrhea on 08/28/2018, Flexi-Seal was placed.  Low index of suspicion for C. difficile in the absence of abdominal pain or leukocytosis.  Diarrhea has now resolved and Flexi-Seal discontinued -patient having daily  bowel movements -Continue TNA until patient is able to increase intake -s/p bedside debridement by general surgery on   Leukocytosis -WBC up to 26.7 -> now improving -Currently afebrile -may be secondary to steroids- however being tapered -will obtain CXR (improved pulm vascular congestion and r basilar atelectasis), CT abd (small volume extraperitoneal free gas in central mesentery, immediately adjacent to the small bowel anastomosis.  No gross free intraperitoneal gas and no substantial intraperitoneal free fluid.  See report.  -Blood cx pending, urine cx with 30,000 CFU e. Coli, hold on abx at this time as she seems asymptomatic. -patient completed course of zosyn -per surgery note, appears to have abscess that drained out of wound, no plan for intervention.  Palliative care c/s.  Hold abx for now. -Continue to monitor CBC   Diarrhea -As above, Has now resolved. -Suspect related to resolving postoperative ileus  Acute lower GI bleed -As per surgery, felt to be due to staple line -Heparin was discontinued, currently on SCDs for DVT prophylaxis - will try to restart heparin today, continue to monitor -GI bleed has resolved -Relatively stable, continue to monitor  -Continue to monitor CBC  Acute kidney injury on chronic kidney disease, stage III -Baseline creatinine approximately 1.5-1.8, peaked to 3.2 in the setting of septic shock, ATN and ischemic bowel -Creatinine currently 1.91 - patient may have [name] new baseline - has significantly elevated BUN, but this seems to be improving -Continue to monitor BMP  Hypophosphatemia/hypomagnesemia -Continue to monitor, appears improved  Hypernatremia - worsened to 156 today -secondary to TPN -pharmacy continuing to adjust TPN -increase d5 water to 100 cc/hr -> afternoon recheck 148, but hyperglycemia.  Corrects to ~153-155.  Continue D5, increase insulin to q4 hrs.  -lasix on hold -continue to monitor BMP  Non-anion gap metabolic  acidosis -Due to diarrhea and mild hyperchloremia -Resolved with sodium bicarbonate  Acute on chronic systolic CHF -Echocardiogram 08/19/2018 shows an EF of 30%.  Inferior basal and inferoseptal akinesis.  Systolic function indeterminate. -Was off of diuretics, ACE inhibitor in the setting of acute kidney injury and septic shock -Patient developed progressive dyspnea and wheezing on 08/31/2018.  Thought to be due to fluids as well as TNA -Patient was given IV Lasix on 09/01/2018 with good urine output -Continue to monitor intake and output, daily weights  Acute on chronic anemia -Patient did receive 1 unit PRBC this hospitalization -Anemia panel suggestive of anemia of chronic disease -Hemoglobin currently 8.4 (stable) -Continue to monitor CBC  Paroxysmal atrial fibrillation/transient NSVT  -Status post DC cardioversion 08/20/2018, currently in sinus rhythm -No anticoagulation given GI bleeding issues -Continue amiodarone, Coreg - Continued runs of nonsustained v tach, continue to monitor -She will need to follow-up with cardiology, Dr. [name]  Severe protein calorie malnutrition -Currently on TNA and dysphagia 1 diet -speech therapy following  CAD -Complaints of chest pain -Continue Coreg  Thrombocytopenia -recovered and resolved  Hyperglycemia/ prediabetes -Suspect secondary to steroids and tapering. -Continue insulin sliding scale and CBG monitoring -Hemoglobin A1c 5.8  Physical deconditioning -Likely secondary to acute illness, prolonged hospitalization, underlying medical problems -PT and OT recommending SNF  Delirium: may have delirium related to hypernatremia, hospitalization.  She is hard of hearing and this likely plays Lylie Blacklock role as well.  Was able to pay attention and answer some questions appropriately, but occasional odd responses as well. Delirium prec, follow  Goals of care: Now DNR after discussion with palliative care and  husband.  Pt husband  allowed to bedside today given patient's significant hearing impairment and to allow husband to see pt current condition.  He noted to me that she is not at her baseline and was not talking as much today.  As noted per palliative, he noted DNR was appropriate and if she were dying he would want her to pass naturally.  Discussed at this point, currently taking things Yang Rack day at Elica Almas time, continuing TPN, monitoring wound, and encouraging PO as able.  DVT prophylaxis: SCD, will start heparin Code Status: full  Family Communication:  discussed with husband Disposition Plan: pending  Consultants:   Palliative care  Surgery  Cardiology  Procedures:  Cardioversion 08/20/2018  Ex laparotomy with lysis of adhesion, bowel resection for ischemic bowel  Echo IMPRESSIONS    1. The left ventricle has Adden Strout visually estimated ejection fraction of approximately 30%. There is inferior basal and inferoseptal akinesis. The cavity size and wall thickness are normal. Diastolic function is indeterminate.  2. The right ventricle has normal systolic function. The cavity was normal. There is no increase in right ventricular wall thickness. Device wire noted in right ventricle. RV-RA gradient 25.9 mmHg, unable to estimate RVSP.  3. Left atrial size was moderately dilated.  4. The aortic valve is tricuspid. Aortic valve regurgitation is trivial by color flow Doppler. Mild aortic annular calcification noted.  5. The mitral valve is grossly normal. There is mild mitral annular calcification present.  6. The aortic root is normal in size and structure.  7. The tricuspid valve is grossly normal.  8. The pericardial effusion is circumferential.  9. Small to moderate pericardial effusion with associated adipose tissue/organization.  Antimicrobials: Anti-infectives (From admission, onward)   Start     Dose/Rate Route Frequency Ordered Stop   08/23/18 1400  piperacillin-tazobactam (ZOSYN) IVPB 2.25 g     2.25 g 100  mL/hr over 30 Minutes Intravenous Every 8 hours 08/23/18 0928 08/30/18 2230   08/21/18 1200  piperacillin-tazobactam (ZOSYN) IVPB 3.375 g  Status:  Discontinued     3.375 g 12.5 mL/hr over 240 Minutes Intravenous Every 8 hours 08/21/18 1102 08/23/18 0928   08/14/18 1000  hydroxychloroquine (PLAQUENIL) tablet 200 mg  Status:  Discontinued    Note to Pharmacy:  Take 1 tablet by mouth daily Monday through Friday only     200 mg Oral Once per day on Mon Tue Wed Thu Fri 08/14/18 0742 08/21/18 1147   08/13/18 2200  doxycycline (VIBRA-TABS) tablet 100 mg  Status:  Discontinued     100 mg Oral Every 12 hours 08/13/18 2134 08/13/18 2135   08/13/18 2200  doxycycline (VIBRAMYCIN) 100 mg in sodium chloride 0.9 % 250 mL IVPB  Status:  Discontinued     100 mg 125 mL/hr over 120 Minutes Intravenous Every 12 hours 08/13/18 2135 08/19/18 1702   08/13/18 2145  cefTRIAXone (ROCEPHIN) 1 g in sodium chloride 0.9 % 100 mL IVPB  Status:  Discontinued     1 g 200 mL/hr over 30 Minutes Intravenous Every 24 hours 08/13/18 2134 08/14/18 0743     Subjective: Denies pain. Hard of hearing.  Objective: Vitals:   09/11/18 0836 09/11/18 0900 09/11/18 1100 09/11/18 1618  BP: (!) 108/47   (!) 125/57  Pulse: 69   71  Resp: 15 12 13 15   Temp: 98.3 F (36.8 C)   97.7 F (36.5 C)  TempSrc: Oral   Oral  SpO2: 96%   97%  Weight:  Height:        Intake/Output Summary (Last 24 hours) at 09/11/2018 1755 Last data filed at 09/11/2018 1715 Gross per 24 hour  Intake 110 ml  Output 1400 ml  Net -1290 ml   Filed Weights   09/02/18 0650 09/05/18 0325 09/09/18 0645  Weight: 73 kg 68.8 kg 62.6 kg    Examination:  General: No acute distress. Cardiovascular: Heart sounds show Jordell Outten regular rate, and rhythm. Lungs: Clear to auscultation bilaterally Abdomen: Soft, nontender, nondistended.  Dressing intact.  Neurological: Alert.  Hard of hearing.  Difficult to tell orientation. Moves all extremities 4. Cranial nerves II  through XII grossly intact. Skin: Warm and dry. No rashes or lesions. Extremities: No clubbing or cyanosis. No edema.   Data Reviewed: I have personally reviewed following labs and imaging studies  CBC: Recent Labs  Lab 09/07/18 0500 09/08/18 0500 09/09/18 0320 09/10/18 0545 09/11/18 0500  WBC  --  17.2* 26.7* 18.1* 15.0*  NEUTROABS  --  13.4*  --   --   --   HGB 8.9* 8.5* 8.5* 7.9* 8.4*  HCT 28.8* 28.0* 27.5* 26.7* 27.9*  MCV  --  89.7 90.5 93.4 93.9  PLT  --  285 250 214 063   Basic Metabolic Panel: Recent Labs  Lab 09/08/18 0500 09/09/18 0320 09/10/18 0545 09/10/18 2252 09/11/18 0500  NA 152* 152* 156* 155* 155*  K 3.8 4.4 4.2 4.3 4.2  CL 105 107 111 112* 112*  CO2 33* 31 30 31 29   GLUCOSE 99 197* 112* 91 212*  BUN 161* 168* 154* 143* 139*  CREATININE 2.04* 2.01* 1.96* 1.91* 1.91*  CALCIUM 8.6* 8.5* 8.7* 8.8* 8.8*  MG 2.1  --  2.4  --  2.4  PHOS 6.0*  --  5.0*  --  5.3*   GFR: Estimated Creatinine Clearance: 20.6 mL/min (Roderic Lammert) (by C-G formula based on SCr of 1.91 mg/dL (H)). Liver Function Tests: Recent Labs  Lab 09/08/18 0500 09/11/18 0500  AST 49* 41  ALT 66* 56*  ALKPHOS 174* 155*  BILITOT 3.6* 4.1*  PROT 5.8* 5.7*  ALBUMIN 1.8* 1.8*   No results for input(s): LIPASE, AMYLASE in the last 168 hours. No results for input(s): AMMONIA in the last 168 hours. Coagulation Profile: No results for input(s): INR, PROTIME in the last 168 hours. Cardiac Enzymes: No results for input(s): CKTOTAL, CKMB, CKMBINDEX, TROPONINI in the last 168 hours. BNP (last 3 results) No results for input(s): PROBNP in the last 8760 hours. HbA1C: No results for input(s): HGBA1C in the last 72 hours. CBG: Recent Labs  Lab 09/10/18 0649 09/10/18 1117 09/10/18 1823 09/11/18 0000 09/11/18 0609  GLUCAP 116* 202* 298* 151* 176*   Lipid Profile: No results for input(s): CHOL, HDL, LDLCALC, TRIG, CHOLHDL, LDLDIRECT in the last 72 hours. Thyroid Function Tests: No results for  input(s): TSH, T4TOTAL, FREET4, T3FREE, THYROIDAB in the last 72 hours. Anemia Panel: No results for input(s): VITAMINB12, FOLATE, FERRITIN, TIBC, IRON, RETICCTPCT in the last 72 hours. Sepsis Labs: No results for input(s): PROCALCITON, LATICACIDVEN in the last 168 hours.  Recent Results (from the past 240 hour(s))  Culture, blood (routine x 2)     Status: None (Preliminary result)   Collection Time: 09/09/18  7:22 AM  Result Value Ref Range Status   Specimen Description BLOOD RIGHT ANTECUBITAL  Final   Special Requests   Final    BOTTLES DRAWN AEROBIC ONLY Blood Culture results may not be optimal due to an inadequate volume of  blood received in culture bottles   Culture   Final    NO GROWTH 2 DAYS Performed at [facility], [place] [address] [place], [place] [address]    Report Status PENDING  Incomplete  Culture, blood (routine x 2)     Status: None (Preliminary result)   Collection Time: 09/09/18  7:28 AM  Result Value Ref Range Status   Specimen Description BLOOD RIGHT HAND  Final   Special Requests   Final    BOTTLES DRAWN AEROBIC ONLY Blood Culture adequate volume   Culture   Final    NO GROWTH 2 DAYS Performed at [facility], [place] [address] [place], [place] [address]    Report Status PENDING  Incomplete  Culture, Urine     Status: Abnormal (Preliminary result)   Collection Time: 09/09/18 10:30 AM  Result Value Ref Range Status   Specimen Description URINE, CLEAN CATCH  Final   Special Requests NONE  Final   Culture (Saraann Enneking)  Final    30,000 COLONIES/mL ESCHERICHIA COLI CULTURE REINCUBATED FOR BETTER GROWTH SUSCEPTIBILITIES TO FOLLOW Performed at [facility], [address], [place], [place] [address]    Report Status PENDING  Incomplete         Radiology Studies: No results found.      Scheduled Meds: . amiodarone  200 mg Oral Daily  . carvedilol  3.125 mg Oral BID WC  . chlorhexidine  15 mL Mouth Rinse BID  . feeding supplement (ENSURE  ENLIVE)  237 mL Oral BID BM  . insulin aspart  0-20 Units Subcutaneous Q6H  . mouth rinse  15 mL Mouth Rinse q12n4p  . methylPREDNISolone (SOLU-MEDROL) injection  40 mg Intravenous Daily  . mupirocin ointment   Nasal BID  . pantoprazole (PROTONIX) IV  40 mg Intravenous QHS  . saccharomyces boulardii  250 mg Oral BID   Continuous Infusions: . dextrose 100 mL/hr (09/11/18 1032)  . TPN ADULT (ION) Stopped (09/11/18 1714)  . TPN ADULT (ION) 30 mL/hr at 09/11/18 1719     LOS: 29 days    Time spent: over 3 min    Fayrene Helper, MD [facility] Pager AMION  If 7PM-7AM, please contact night-coverage [email or web address] Password [facility] 09/11/2018, 5:55 PM

## 2018-09-11 NOTE — Consult Note (Signed)
Consultation Note Date: 09/11/2018   Patient Name: [name]  DOB: 1940/09/20  MRN: [id number]  Age / Sex: 78 y.o., female  PCP: [name], MD Referring Physician: Elodia Florence., *  Reason for Consultation: Establishing goals of care  HPI/Patient Profile: 78 y.o. female  with past medical history of anemia, ASCVD, AICD placement, breast cancer, CKD 3, systolic heart failure, HLD, HTN, GERD, gout, RA, and impaired hearing admitted on 08/13/2018 with abdominal pain and decreased appetite.  Diagnosed with SBO and failed conservative management. Patient underwent surgical interventions with ex lap, lysis of adhesions, and bowel resection for ischemic bowel. She required ventilator and pressor support in ICU following surgery - these were discontinued 5/3. Since extubation she has had some issues with respiratory distress and prolonged postop ileus - she has required ongoing TNA. Surgical wound has dehisced. Per PT/OT eval - patient very weak and lethargic during sessions. Minimal PO intake. PMT consulted by surgery for GOC d/t poor PO intake, wound dehiscence, and guarded prognosis.   Clinical Assessment and Goals of Care: I have reviewed medical records including EPIC notes, labs and imaging, received report from Dr. Florene Glen, and then met with patient's husband, [name],  to discuss diagnosis prognosis, [place], EOL wishes, disposition and options.  I introduced Palliative Medicine as specialized medical care for people living with serious illness. It focuses on providing relief from the symptoms and stress of a serious illness. The goal is to improve quality of life for both the patient and the family.  [name] shares how difficult this situation is for him - he is having trouble sleeping as he is so worried about his wife. Emotional support provided to [facility].   As far as functional and nutritional status, he tells me prior to this illness [name] was  doing very well. She was able to drive, independently care for herself, and take care of their home. He tells me she had no problems with appetite.   We discussed her current illness and what it means in the larger context of her on-going co-morbidities.  Natural disease trajectory and expectations at EOL were discussed. We discussed [facility] course - he has communicated with other providers and has good understanding of this. We specifically discussed Danielys's frailty and poor PO intake at length - discussed why this is concerning for overall outcomes.   I attempted to elicit values and goals of care important to the patient.  He tells me that he and Elveta never spoke much about what her wishes would be if she were very ill; however, he does agree that he knows her better than anyone and agrees that he can serve as a Air traffic controller since Itzae is having difficulty expressing her wishes/lethargic and slightly confused.   Advance directives, concepts specific to code status, artifical feeding and hydration, and rehospitalization were considered and discussed. [name] shares that if [facility] were dying he would want her to pass naturally - he would not want her to receive CPR or be placed back on a ventilator. We discussed changing her code status to DNR and [name] agrees.   [name] very much remains hopeful that [facility] improves - however, he also seems to be realistic and understands that Elyzabeth is not doing well. Ultimately, he does not want her to suffer.   Dr. Florene Glen is attempting to make arrangements so that [name] can come and see Larcenia - this could be quite beneficial to help [facility] better understand extent of Tahlia's illness.  [name] is also hopeful that he can help Ethyl understand what is going on, encourage her to eat and work with therapy. I suspect this visit would greatly benefit both patient and spouse and aid in future goals of care conversations.   [name] also mentions that if Michiah does improve to the point of  discharge he is agreeable to her going to rehab facilities.  Questions and concerns were addressed. The family was encouraged to call with questions or concerns.   Discussed the above conversation with Dr. Florene Glen - he is working on visit between patient and spouse and hopes to speak to spouse when he is at the hospital. We discussed that PMT will continue to follow along, if patient continues to decline, PMT can assist with transition to care focused more on comfort and avoiding aggressive interventions.   Primary Decision Maker NEXT OF KIN - spouse [name]   SUMMARY OF RECOMMENDATIONS   - Initial palliative conversation with patient's spouse - education completed about condition, concerns about Aubriel's recovery - code status changed to DNR per discussion - Dr. Florene Glen working to arrange visit and discuss with spouse in person - PMT will continue to follow along and assist with decisions as they arise or patient declines  Code Status/Advance Care Planning:  DNR   Symptom Management:   Per primary  Palliative Prophylaxis:   Aspiration, Delirium Protocol, Frequent Pain Assessment and Turn Reposition  Prognosis:   Unable to determine  Discharge Planning: To Be Determined      Primary Diagnoses: Present on Admission: . CAP (community acquired pneumonia) . AKI (acute kidney injury) ([place]) . Anemia of chronic disease . Dyslipidemia . Chronic combined systolic and diastolic CHF (congestive heart failure) ([place]) . SBO (small bowel obstruction) ([place])   I have reviewed the medical record, interviewed the patient and family, and examined the patient. The following aspects are pertinent.  Past Medical History:  Diagnosis Date  . Anemia    Hgb of 9-10  . Anemia of chronic disease    Hgb of 9-10 chronically; 06/2010: H&H-10.7/33.5, MCV-81, normal iron studies in 2010   . Arteriosclerotic cardiovascular disease (ASCVD)    Remote PTCA by patient report; LBBB; associated  cardiomyopathy, presumed ischemic with EF 40-45% previously, 20% in 06/2009; h/o clinical congestive heart failure; negative stress nuclear in 2009 with inferoseptal and apical scar  . Automatic implantable cardioverter-defibrillator in situ   . Breast cancer ([place])    breast  . Chronic bronchitis ([place])   . Chronic renal disease, stage 3, moderately decreased glomerular filtration rate (GFR) between 30-59 mL/min/1.73 square meter (HCC) 02/01/2016  . Congestive heart failure (CHF) ([place])   . Elevated cholesterol   . Elevated sed rate   . GERD (gastroesophageal reflux disease)   . GI bleed   . Gout   . HOH (hard of hearing)   . Hyperlipidemia   . Hypertension   . Rheumatoid arthritis ([place])   . UTI (urinary tract infection)    Social History   Socioeconomic History  . Marital status: Married    Spouse name: Not on file  . Number of children: Not on file  . Years of education: Not on file  . Highest education level: Not on file  Occupational History  . Occupation: Retired  Scientific laboratory technician  . Financial resource strain: Not very hard  . Food insecurity:    Worry: Never true    Inability: Never true  . Transportation needs:    Medical: No  Non-medical: No  Tobacco Use  . Smoking status: Former Smoker    Packs/day: 0.25    Years: 30.00    Pack years: 7.50    Types: Cigarettes    Start date: 09/17/1956    Last attempt to quit: 04/24/2007    Years since quitting: 11.3  . Smokeless tobacco: Current User    Types: Chew  . Tobacco comment: 07/03/2013 "smoked some; don't know how much or how long or when I quit"  Substance and Sexual Activity  . Alcohol use: No    Alcohol/week: 0.0 standard drinks  . Drug use: No  . Sexual activity: Not Currently    Birth control/protection: Post-menopausal  Lifestyle  . Physical activity:    Days per week: Not on file    Minutes per session: Not on file  . Stress: Not on file  Relationships  . Social connections:    Talks on phone: Not on file     Gets together: Not on file    Attends religious service: Not on file    Active member of club or organization: Not on file    Attends meetings of clubs or organizations: Not on file    Relationship status: Not on file  Other Topics Concern  . Not on file  Social History Narrative   Married, lives with spouse   1 daughter, died in 10 in a car accident   Retired - worked in [employer] fundraiser   No recent travel   Family History  Problem Relation Age of Onset  . Diabetes Father   . Pancreatic cancer Father   . Hypertension Brother    Scheduled Meds: . amiodarone  200 mg Oral Daily  . carvedilol  3.125 mg Oral BID WC  . chlorhexidine  15 mL Mouth Rinse BID  . feeding supplement (ENSURE ENLIVE)  237 mL Oral BID BM  . insulin aspart  0-20 Units Subcutaneous Q6H  . mouth rinse  15 mL Mouth Rinse q12n4p  . methylPREDNISolone (SOLU-MEDROL) injection  40 mg Intravenous Daily  . mupirocin ointment   Nasal BID  . pantoprazole (PROTONIX) IV  40 mg Intravenous QHS  . saccharomyces boulardii  250 mg Oral BID   Continuous Infusions: . dextrose 50 mL/hr at 09/10/18 2019  . TPN ADULT (ION) 30 mL/hr at 09/10/18 1734   PRN Meds:.acetaminophen **OR** acetaminophen, albuterol, ondansetron **OR** ondansetron (ZOFRAN) IV, oxyCODONE, sodium chloride flush Allergies  Allergen Reactions  . Macrobid [Nitrofurantoin Macrocrystal] Hives and Itching   Vital Signs: BP (!) 118/51 (BP Location: Right Arm)   Pulse 70   Temp 97.9 F (36.6 C) (Oral)   Resp 13   Ht '5\' 4"'  (1.626 m)   Wt 62.6 kg Comment: removed SCDs  SpO2 95%   BMI 23.69 kg/m  Pain Scale: PAINAD POSS *See Group Information*: 1-Acceptable,Awake and alert Pain Score: 0-No pain   SpO2: SpO2: 95 % O2 Device:SpO2: 95 % O2 Flow Rate: .O2 Flow Rate (L/min): 0 L/min  IO: Intake/output summary:   Intake/Output Summary (Last 24 hours) at 09/11/2018 0816 Last data filed at 09/10/2018 1600 Gross per 24 hour  Intake 390 ml  Output 800 ml   Net -410 ml    LBM: Last BM Date: 09/10/18 Baseline Weight: Weight: 66.7 kg Most recent weight: Weight: 62.6 kg(removed SCDs)     Palliative Assessment/Data: PPS 20%    The above conversation was completed via telephone due to the visitor restrictions during the COVID-19 pandemic. Thorough chart review and discussion with necessary  members of the care team was completed as part of assessment. All issues were discussed and addressed but no physical exam was performed.  Time Total: 70 minutes Greater than 50%  of this time was spent counseling and coordinating care related to the above assessment and plan.  Juel Burrow, DNP, AGNP-C Palliative Medicine Team [phone] Pager: [phone]

## 2018-09-11 NOTE — Progress Notes (Signed)
[facility]/Trauma Progress Note  21 Days Post-Op   Assessment/Plan  Acute on chronic renal failure- Cr 1.91 ICD/CAD/CHF-per cardiology Acute Respiratory Failure-resolved.  ABL Anemia-hgb stable  SBO&ischemic/necroticbowel - S/P ex lapw/SBR, LOA, appendectomy, 04/30, Dr. Marlou Starks, POD 21 -prealbumin 34 on 05/19, continue calorie count -JPremoved -completed zosyn x10 days, afebrile, WBC down to 15 from 18.1 - Mobilize and IS - wound remains dehisced,mepitel and wet to dry - CT Abd/pel 05/19 relatively unimpressive.    FEN - 1/2 rateTPN,DYS1, calorie count  VTE - SCDs, okay for lovenox from our standpoint but will defer to medicine  ID -zosyn 4/30>>5/9. Foley - Removed 5/7 Follow up - Dr. Otelia Limes with patient's husband, [name]. He states he is off work today and can come to the hospital if needed. Medicine is working with palliative care to perhaps set up a meeting. We will follow. No additional surgical intervention at this time. We are available if needed for a meeting.     LOS: 29 days    Subjective: CC: no complaints  Pt states no abdominal pain. She does understand some lip reading.   Objective: Vital signs in last 24 hours: Temp:  [97.9 F (36.6 C)-98.4 F (36.9 C)] 97.9 F (36.6 C) (05/21 0336) Pulse Rate:  [50-86] 70 (05/21 0336) Resp:  [12-21] 13 (05/21 0336) BP: (116-132)/(41-66) 118/51 (05/21 0336) SpO2:  [94 %-98 %] 95 % (05/21 0336) Last BM Date: 09/10/18  Intake/Output from previous day: 05/20 0701 - 05/21 0700 In: 390 [P.O.:60; I.V.:330] Out: 800 [Urine:800] Intake/Output this shift: No intake/output data recorded.  PE: Gen:  Alert, NAD, pleasant, cooperative Pulm:  Rate and effort normal Abd: Soft, mild distention, +BS, see wound below. No feculent drainage noted. No TTP. Skin: warm and dry       Anti-infectives: Anti-infectives (From admission, onward)   Start     Dose/Rate Route Frequency Ordered  Stop   08/23/18 1400  piperacillin-tazobactam (ZOSYN) IVPB 2.25 g     2.25 g 100 mL/hr over 30 Minutes Intravenous Every 8 hours 08/23/18 0928 08/30/18 2230   08/21/18 1200  piperacillin-tazobactam (ZOSYN) IVPB 3.375 g  Status:  Discontinued     3.375 g 12.5 mL/hr over 240 Minutes Intravenous Every 8 hours 08/21/18 1102 08/23/18 0928   08/14/18 1000  hydroxychloroquine (PLAQUENIL) tablet 200 mg  Status:  Discontinued    Note to Pharmacy:  Take 1 tablet by mouth daily Monday through Friday only     200 mg Oral Once per day on Mon Tue Wed Thu Fri 08/14/18 0742 08/21/18 1147   08/13/18 2200  doxycycline (VIBRA-TABS) tablet 100 mg  Status:  Discontinued     100 mg Oral Every 12 hours 08/13/18 2134 08/13/18 2135   08/13/18 2200  doxycycline (VIBRAMYCIN) 100 mg in sodium chloride 0.9 % 250 mL IVPB  Status:  Discontinued     100 mg 125 mL/hr over 120 Minutes Intravenous Every 12 hours 08/13/18 2135 08/19/18 1702   08/13/18 2145  cefTRIAXone (ROCEPHIN) 1 g in sodium chloride 0.9 % 100 mL IVPB  Status:  Discontinued     1 g 200 mL/hr over 30 Minutes Intravenous Every 24 hours 08/13/18 2134 08/14/18 0743      Lab Results:  Recent Labs    09/10/18 0545 09/11/18 0500  WBC 18.1* 15.0*  HGB 7.9* 8.4*  HCT 26.7* 27.9*  PLT 214 194   BMET Recent Labs    09/10/18 2252 09/11/18 0500  NA 155* 155*  K  4.3 4.2  CL 112* 112*  CO2 31 29  GLUCOSE 91 212*  BUN 143* 139*  CREATININE 1.91* 1.91*  CALCIUM 8.8* 8.8*   PT/INR No results for input(s): LABPROT, INR in the last 72 hours. CMP     Component Value Date/Time   NA 155 (H) 09/11/2018 0500   K 4.2 09/11/2018 0500   CL 112 (H) 09/11/2018 0500   CO2 29 09/11/2018 0500   GLUCOSE 212 (H) 09/11/2018 0500   BUN 139 (H) 09/11/2018 0500   CREATININE 1.91 (H) 09/11/2018 0500   CREATININE 1.07 (H) 02/13/2018 1142   CALCIUM 8.8 (L) 09/11/2018 0500   PROT 5.7 (L) 09/11/2018 0500   ALBUMIN 1.8 (L) 09/11/2018 0500   AST 41 09/11/2018 0500    ALT 56 (H) 09/11/2018 0500   ALKPHOS 155 (H) 09/11/2018 0500   BILITOT 4.1 (H) 09/11/2018 0500   GFRNONAA 24 (L) 09/11/2018 0500   GFRNONAA 49 (L) 02/13/2018 1142   GFRAA 28 (L) 09/11/2018 0500   GFRAA 57 (L) 02/13/2018 1142   Lipase     Component Value Date/Time   LIPASE 30 08/13/2018 1926    Studies/Results: Ct Abdomen Pelvis Wo Contrast  Result Date: 09/09/2018 CLINICAL DATA:  Decreased appetite. Leukocytosis. Recent small bowel resection and adhesiolysis on 08/21/2018. EXAM: CT ABDOMEN AND PELVIS WITHOUT CONTRAST TECHNIQUE: Multidetector CT imaging of the abdomen and pelvis was performed following the standard protocol without IV contrast. COMPARISON:  08/19/2018 FINDINGS: Lower chest: Dependent collapse/consolidation in both lower lobes. Hepatobiliary: No focal abnormality in the liver on this study without intravenous contrast. Tiny calcified gallstones evident. No intrahepatic or extrahepatic biliary dilation. Pancreas: No focal mass lesion. No dilatation of the main duct. No intraparenchymal cyst. No peripancreatic edema. Spleen: No splenomegaly. No focal mass lesion. Adrenals/Urinary Tract: No adrenal nodule or mass. 2.1 cm low-density lesion lower pole right kidney is stable and compatible with cyst. Left kidney unremarkable on this noncontrast study. No evidence for hydroureter. The urinary bladder appears normal for the degree of distention. Stomach/Bowel: Stomach is unremarkable. No gastric wall thickening. No evidence of outlet obstruction. Duodenum is normally positioned as is the ligament of Treitz. No small bowel wall thickening. No small bowel dilatation. Small bowel anastomosis noted right abdomen. The terminal ileum is normal. No gross colonic mass. No colonic wall thickening. Vascular/Lymphatic: There is abdominal aortic atherosclerosis without aneurysm. There is no gastrohepatic or hepatoduodenal ligament lymphadenopathy. No intraperitoneal or retroperitoneal lymphadenopathy.  No pelvic sidewall lymphadenopathy. Reproductive: Unremarkable Other: A small amount of intraperitoneal free gas is identified (see image 60/series 3). Coronal imaging demonstrates a small gas locule immediately adjacent to the small bowel anastomosis (coronal image 42/series 6). There is some minimal edema in the mesentery adjacent to the gas bubbles, but no substantial fluid is identified in the cul-de-sac or para colic gutters. Musculoskeletal: Midline anterior abdominal wall open wound evident. No worrisome lytic or sclerotic osseous abnormality. IMPRESSION: 1. Small volume extraperitoneal free gas identified in the central mesentery, immediately adjacent to the small bowel anastomosis. No gross free intraperitoneal gas and there is no substantial intraperitoneal free fluid. Residual extraluminal gas is not expected more than about 10 days out from surgery and despite the absence of intraperitoneal free fluid, anastomotic leak cannot be excluded. 2. Cholelithiasis. 3.  Aortic Atherosclerois (ICD10-170.0) Electronically Signed   By: Misty Stanley M.D.   On: 09/09/2018 13:46   Dg Chest 1 View  Result Date: 09/09/2018 CLINICAL DATA:  Leukocytosis. EXAM: CHEST  1 VIEW COMPARISON:  Chest x-ray dated Sep 01, 2018. FINDINGS: Unchanged left chest wall pacemaker. Unchanged right internal jugular central venous catheter. Stable mild cardiomegaly. Atherosclerotic calcification of the aortic arch. Improved pulmonary vascular congestion. Persistent bibasilar atelectasis, improved on the right. No pleural effusion or pneumothorax. No acute osseous abnormality. IMPRESSION: Improved pulmonary vascular congestion and right basilar atelectasis. Electronically Signed   By: Titus Dubin M.D.   On: 09/09/2018 11:30      Kalman Drape , [facility] 09/11/2018, 8:07 AM  Pager: [phone] Mon-Wed, Friday 7:00am-4:30pm Thurs 7am-11:30am  Consults: [phone]

## 2018-09-12 ENCOUNTER — Ambulatory Visit (HOSPITAL_COMMUNITY): Payer: Medicare Other | Admitting: Hematology

## 2018-09-12 LAB — CBC
HCT: 26.9 % — ABNORMAL LOW (ref 36.0–46.0)
Hemoglobin: 8 g/dL — ABNORMAL LOW (ref 12.0–15.0)
MCH: 27.3 pg (ref 26.0–34.0)
MCHC: 29.7 g/dL — ABNORMAL LOW (ref 30.0–36.0)
MCV: 91.8 fL (ref 80.0–100.0)
Platelets: 194 10*3/uL (ref 150–400)
RBC: 2.93 MIL/uL — ABNORMAL LOW (ref 3.87–5.11)
RDW: 21.7 % — ABNORMAL HIGH (ref 11.5–15.5)
WBC: 14.1 10*3/uL — ABNORMAL HIGH (ref 4.0–10.5)
nRBC: 0.1 % (ref 0.0–0.2)

## 2018-09-12 LAB — COMPREHENSIVE METABOLIC PANEL
ALT: 56 U/L — ABNORMAL HIGH (ref 0–44)
AST: 42 U/L — ABNORMAL HIGH (ref 15–41)
Albumin: 1.7 g/dL — ABNORMAL LOW (ref 3.5–5.0)
Alkaline Phosphatase: 151 U/L — ABNORMAL HIGH (ref 38–126)
Anion gap: 10 (ref 5–15)
BUN: 110 mg/dL — ABNORMAL HIGH (ref 8–23)
CO2: 27 mmol/L (ref 22–32)
Calcium: 8.4 mg/dL — ABNORMAL LOW (ref 8.9–10.3)
Chloride: 108 mmol/L (ref 98–111)
Creatinine, Ser: 1.71 mg/dL — ABNORMAL HIGH (ref 0.44–1.00)
GFR calc Af Amer: 32 mL/min — ABNORMAL LOW (ref >60)
GFR calc non Af Amer: 28 mL/min — ABNORMAL LOW (ref >60)
Glucose, Bld: 94 mg/dL (ref 70–99)
Potassium: 4.4 mmol/L (ref 3.5–5.1)
Sodium: 145 mmol/L (ref 135–145)
Total Bilirubin: 4.2 mg/dL — ABNORMAL HIGH (ref 0.3–1.2)
Total Protein: 5.4 g/dL — ABNORMAL LOW (ref 6.5–8.1)

## 2018-09-12 LAB — URINE CULTURE: Culture: 30000 — AB

## 2018-09-12 LAB — GLUCOSE, CAPILLARY
Glucose-Capillary: 85 mg/dL (ref 70–99)
Glucose-Capillary: 214 mg/dL — ABNORMAL HIGH (ref 70–99)
Glucose-Capillary: 161 mg/dL — ABNORMAL HIGH (ref 70–99)
Glucose-Capillary: 136 mg/dL — ABNORMAL HIGH (ref 70–99)
Glucose-Capillary: 177 mg/dL — ABNORMAL HIGH (ref 70–99)
Glucose-Capillary: 190 mg/dL — ABNORMAL HIGH (ref 70–99)
Glucose-Capillary: 225 mg/dL — ABNORMAL HIGH (ref 70–99)

## 2018-09-12 LAB — MAGNESIUM: Magnesium: 2.2 mg/dL (ref 1.7–2.4)

## 2018-09-12 MED ORDER — TRACE MINERALS CR-CU-MN-SE-ZN 10-1000-500-60 MCG/ML IV SOLN
INTRAVENOUS | Status: AC
  Administered 2018-09-12: 17:00:00 via INTRAVENOUS
  Filled 2018-09-12: qty 249.6

## 2018-09-12 NOTE — Progress Notes (Signed)
Daily Progress Note   Patient Name: [name]       Date: 09/12/2018 DOB: [birth year]  Age: 78 y.o. MRN#: [id number] Attending Physician: Savannah Memos, DO Primary Care Physician: Savannah Fire, MD Admit Date: 08/13/2018  Reason for Consultation/Follow-up: Establishing goals of care  Subjective: Checked in with RN - patient is interactive today yet remains confused. Not eating much - a few small bites and sips for breakfast. Did work with PT and use a walked to get to the chair.  Length of Stay: 30  Current Medications: Scheduled Meds:  . amiodarone  200 mg Oral Daily  . carvedilol  3.125 mg Oral BID WC  . chlorhexidine  15 mL Mouth Rinse BID  . feeding supplement (ENSURE ENLIVE)  237 mL Oral BID BM  . heparin injection (subcutaneous)  5,000 Units Subcutaneous Q8H  . insulin aspart  0-20 Units Subcutaneous Q4H  . mouth rinse  15 mL Mouth Rinse q12n4p  . methylPREDNISolone (SOLU-MEDROL) injection  40 mg Intravenous Daily  . mupirocin ointment   Nasal BID  . pantoprazole (PROTONIX) IV  40 mg Intravenous QHS  . saccharomyces boulardii  250 mg Oral BID    Continuous Infusions: . dextrose 100 mL/hr at 09/11/18 2323  . TPN ADULT (ION) 30 mL/hr at 09/12/18 0000  . TPN ADULT (ION)      PRN Meds: acetaminophen **OR** acetaminophen, albuterol, ondansetron **OR** ondansetron (ZOFRAN) IV, oxyCODONE, sodium chloride flush     Vital Signs: BP (!) 113/48 (BP Location: Right Arm)   Pulse 70   Temp (!) 96.5 F (35.8 C) (Axillary)   Resp 14   Ht 5\' 4"  (1.626 m)   Wt 61.7 kg   SpO2 100%   BMI 23.35 kg/m  SpO2: SpO2: 100 % O2 Device: O2 Device: Room Air O2 Flow Rate: O2 Flow Rate (L/min): 0 L/min  Intake/output summary:   Intake/Output Summary (Last 24 hours) at 09/12/2018 1055 Last data  filed at 09/12/2018 1000 Gross per 24 hour  Intake 2386.39 ml  Output 1400 ml  Net 986.39 ml   LBM: Last BM Date: 09/12/18 Baseline Weight: Weight: 66.7 kg Most recent weight: Weight: 61.7 kg       Palliative Assessment/Data:PPS 20%    Flowsheet Rows     Most Recent Value  Intake Tab  Referral Department  Surgery  Unit at Time of Referral  Intermediate Care Unit  Palliative Care Primary Diagnosis  Sepsis/Infectious Disease  Date Notified  09/10/18  Palliative Care Type  New Palliative care  Reason for referral  Clarify Goals of Care  Date of Admission  08/13/18  Date first seen by Palliative Care  09/11/18  # of days Palliative referral response time  1 Day(s)  # of days IP prior to Palliative referral  28  Clinical Assessment  Palliative Performance Scale Score  20%  Psychosocial & Spiritual Assessment  Palliative Care Outcomes  Patient/Family meeting held?  Yes  Who was at the meeting?  spouse  Palliative Care Outcomes  Clarified goals of care, Provided advance care planning, Provided psychosocial or spiritual support      Patient Active Problem List   Diagnosis Date Noted  . Goals of  care, counseling/discussion   . Palliative care by specialist   . Acute respiratory failure with hypoxia ([place])   . Protein-calorie malnutrition, severe 08/25/2018  . Wide-complex tachycardia ([place])   . Acute renal failure superimposed on stage 3 chronic kidney disease ([place]) 08/18/2018  . SBO (small bowel obstruction) ([place]) 08/14/2018  . Pressure injury of skin 08/14/2018  . CAP (community acquired pneumonia) 08/13/2018  . AKI (acute kidney injury) ([place]) 08/10/2017  . Hyperkalemia 08/10/2017  . Acute blood loss anemia 09/20/2016  . Ischemic cardiomyopathy 09/20/2016  . Chronic combined systolic and diastolic CHF (congestive heart failure) ([place]) 09/20/2016  . CKD (chronic kidney disease), stage IV ([place]) 09/20/2016  . History of breast cancer 08/02/2016  . Primary osteoarthritis of  both knees 08/02/2016  . Primary osteoarthritis of both feet 08/02/2016  . History of anemia 07/17/2016  . History of CHF (congestive heart failure) 06/04/2016  . History of chronic kidney disease 06/04/2016  . Symptomatic anemia 06/04/2016  . Elevated sedimentation rate 03/03/2016  . Idiopathic chronic gout of multiple sites without tophus 03/03/2016  . Total knee replacement status, right 03/03/2016  . High risk medication use 03/03/2016  . Chronic kidney disease (CKD) stage G3b/A1, moderately decreased glomerular filtration rate (GFR) between 30-44 mL/min/1.73 square meter and albuminuria creatinine ratio less than 30 mg/g ([facility]) 02/01/2016  . CAD S/P remote PCI- no details 09/02/2014  . Cardiomyopathy, ischemic-EF 30-35% March 2015 09/02/2014  . Diastolic dysfunction-grade 2 09/02/2014  . CHF exacerbation ([place]) 08/28/2014  . Acute on chronic combined systolic and diastolic congestive heart failure ([place]) 08/28/2014  . Iron deficiency anemia 08/20/2013  . Malnutrition of moderate degree ([place]) 07/21/2013  . PNA (pneumonia) 07/19/2013  . HCAP (healthcare-associated pneumonia) 07/17/2013  . Sepsis ([place]) 07/17/2013  . ARF (acute renal failure) ([place]) 07/17/2013  . Rheumatoid arthritis ([place]) 07/04/2013  . Hypokalemia 07/03/2013  . Chest pain 07/03/2013  . Biventricular ICD in place (MDT 2014) 11/06/2012  . Anemia of chronic disease   . Breast cancer ([place])   . Hypertension   . Dyslipidemia 05/24/2009  . Chronic systolic heart failure ([place]) 05/24/2009  . Primary osteoarthritis of both hands 08/11/2007    Palliative Care Assessment & Plan   HPI: 78 y.o. female  with past medical history of anemia, ASCVD, AICD placement, breast cancer, CKD 3, systolic heart failure, HLD, HTN, GERD, gout, RA, and impaired hearing admitted on 08/13/2018 with abdominal pain and decreased appetite.  Diagnosed with SBO and failed conservative management. Patient underwent surgical interventions with ex lap,  lysis of adhesions, and bowel resection for ischemic bowel. She required ventilator and pressor support in ICU following surgery - these were discontinued 5/3. Since extubation she has had some issues with respiratory distress and prolonged postop ileus - she has required ongoing TNA. Surgical wound has dehisced. Per PT/OT eval - patient very weak and lethargic during sessions. Minimal PO intake. PMT consulted by surgery for GOC d/t poor PO intake, wound dehiscence, and guarded prognosis.   Assessment: Follow up today - Per RN, some improvement in mental status compared to yesterday - remains confused but is more interactive with staff. Working more with PT. Appetite still remains a concern - only taking small bites.   Attempted to call husband today and follow up - he was able to visit patient yesterday. I called both home and cell numbers - he did not answer either and I was unable to leave a message. It is noted that on days he works he is only  able to speak on the phone 8-8:30 AM so I assume he is working today.   Per our discussion yesterday and discussion with Dr. Florene Glen: code status was changed to DNR, but continue current care and "take it a day at a time". Based off of our conversation yesterday, he does not want his wife to suffer. I suspect if she declines he would want to focus on comfort and avoid aggressive medical interventions.   Recommendations/Plan:  PMT will follow up again next week  Code status changed to DNR  Continue current care   Code Status:  DNR  Prognosis:   Unable to determine  Discharge Planning:  To Be Determined  Care plan was discussed with RN  Thank you for allowing the Palliative Medicine Team to assist in the care of this patient.   Total Time 15 minutes Prolonged Time Billed  no    The above conversation was completed via telephone due to the visitor restrictions during the COVID-19 pandemic. Thorough chart review and discussion with necessary  members of the care team was completed as part of assessment. All issues were discussed and addressed but no physical exam was performed.     Greater than 50%  of this time was spent counseling and coordinating care related to the above assessment and plan.  Juel Burrow, DNP, [facility] Palliative Medicine Team Team Phone # [phone]  Pager [phone]

## 2018-09-12 NOTE — Progress Notes (Signed)
Physical Therapy Treatment Patient Details Name: [name] MRN: [id number] DOB: 08/16/1940 Today's Date: 09/12/2018    History of Present Illness Pt is a 78 year old female with significant cardiac history that is post exploratory laparotomy for small bowel obstruction with associated ischemic bowel on 4/30, return to the intensive care on mechanical ventilator, critical care asked to assist with care.  VDRF 4/30-5/3.  Also PNA.  HQP:[id number] heart failure with EF 30%, nonsustained VT, pericardial effusion CAD w/ prior AICD, diastolic heart failure, CKD stage III, hypertension, rheumatoid arthritis, anemia of chronic disease.    PT Comments    Patient is making progress. She was able to stand with the walker with mod a. She was able to take steps. She also participated more with bed mobility. She continues to require significant assist. She tolerated there-ex well. Plan for SNF for rehab remains appropriate.  Acute therapy will continue to work with the patient.   Follow Up Recommendations  SNF;Supervision/Assistance - 24 hour     Equipment Recommendations  None recommended by PT    Recommendations for Other Services       Precautions / Restrictions Precautions Precautions: Fall Precaution Comments: Patient at first was putting her hands on her chest but when aksed idf sshe had chest pain she shook her head no.  Restrictions Weight Bearing Restrictions: No    Mobility  Bed Mobility Overal bed mobility: Needs Assistance Bed Mobility: Supine to Sit;Sit to Supine     Supine to sit: Mod assist;+2 for physical assistance;HOB elevated     General bed mobility comments: Patient required mod a to scoot to the edge of the bed but she was able to help with her legs and use her arms to help pull to sitting.   Transfers Overall transfer level: Needs assistance Equipment used: Rolling walker (2 wheeled) Transfers: Sit to/from Stand Sit to Stand: Mod assist;+2 physical  assistance;+2 safety/equipment         General transfer comment: Patientin able to stand to the walker with mod a +2  Ambulation/Gait Ambulation/Gait assistance: Mod assist;+2 physical assistance;+2 safety/equipment Gait Distance (Feet): 3 Feet Assistive device: Rolling walker (2 wheeled) Gait Pattern/deviations: Shuffle     General Gait Details: Patient able to take steps to the chair with mod a for strength and balance    Stairs             Wheelchair Mobility    Modified Rankin (Stroke Patients Only)       Balance Overall balance assessment: Needs assistance Sitting-balance support: Feet supported Sitting balance-Leahy Scale: Fair Sitting balance - Comments: less assist required today to remain balanced sitting but did have 1 instance where she fell to the left   Standing balance support: Bilateral upper extremity supported Standing balance-Leahy Scale: Poor Standing balance comment: improved standing balance but still required mod a                             Cognition Arousal/Alertness: Awake/alert Behavior During Therapy: WFL for tasks assessed/performed Overall Cognitive Status: Difficult to assess Area of Impairment: Following commands;Safety/judgement;Problem solving                 Orientation Level: Place;Situation   Memory: Decreased short-term memory Following Commands: Follows one step commands inconsistently Safety/Judgement: Decreased awareness of safety;Decreased awareness of deficits   Problem Solving: Slow processing;Decreased initiation;Difficulty sequencing;Requires verbal cues;Requires tactile cues General Comments: could shake her head yes or no to questions  Exercises General Exercises - Lower Extremity Ankle Circles/Pumps: AROM;Both;10 reps;Seated Long Arc Quad: 10 reps;AROM;Both Heel Slides: AAROM;10 reps Hip ABduction/ADduction: 10 reps;Both;AROM Hip Flexion/Marching: AROM;Both;10 reps;Seated(cuing to  slow down )    General Comments General comments (skin integrity, edema, etc.): HR remained between 78-84 with transfers.       Pertinent Vitals/Pain Pain Assessment: Faces Faces Pain Scale: No hurt    Home Living                      Prior Function            PT Goals (current goals can now be found in the care plan section) Acute Rehab PT Goals Patient Stated Goal: none stated today PT Goal Formulation: With patient Time For Goal Achievement: 09/08/18 Potential to Achieve Goals: Good Progress towards PT goals: Progressing toward goals    Frequency    Min 2X/week      PT Plan Current plan remains appropriate    Co-evaluation              AM-PAC PT "6 Clicks" Mobility   Outcome Measure    Help needed moving from lying on your back to sitting on the side of a flat bed without using bedrails?: A Lot Help needed moving to and from a bed to a chair (including a wheelchair)?: A Lot Help needed standing up from a chair using your arms (e.g., wheelchair or bedside chair)?: A Lot Help needed to walk in hospital room?: Total Help needed climbing 3-5 steps with a railing? : Total 6 Click Score: 8    End of Session Equipment Utilized During Treatment: Gait belt Activity Tolerance: Patient limited by pain;Patient limited by fatigue;Patient limited by lethargy Patient left: in bed;with call bell/phone within reach;with bed alarm set;with SCD's reapplied Nurse Communication: Mobility status PT Visit Diagnosis: Unsteadiness on feet (R26.81);Muscle weakness (generalized) (M62.81)     Time: 9371-6967 PT Time Calculation (min) (ACUTE ONLY): 18 min  Charges:  $Therapeutic Activity: 8-22 mins                        Carney Living PT DPT  09/12/2018, 1:08 PM

## 2018-09-12 NOTE — Progress Notes (Signed)
PROGRESS NOTE  [name] ENI:[id number] DOB: 1941-03-15 DOA: 08/13/2018 PCP: [name], MD  HPI/Recap of past 24 hours: HPI on 08/13/2018 by Dr. Bernadene Person [name] a 78 y.o.femalewith medical history significant ofanemia chronic disease, ASCVD, AICD placement, history of breast cancer, chronic bronchitis, stage III chronic renal disease, systolic heart failure, hyperlipidemia, GERD, history of GI bleed, gout, hypertension, rheumatoid arthritis, impaired hearing, history of UTI who is coming to the emergency department with complaints of abdominal pain apparently for the past 3 days associated with decreased appetite, several episodes of diarrhea, nausea, but no emesis. She denies fever, cough, sore throat, chest pain, dysuria, frequency, hematuria, melena or hematochezia. She denies polyuria, polydipsia, polyphagia or blurred vision. History is limited by the patient severe hearing impairment.  Interim history Patient was admitted with small bowel obstruction, failed conservative management and was transferred to [name]. General surgery consulted and appreciated, patient underwent ex lap with lysis of adhesions, bowel resection for ischemic bowel. She continued on the ventilator was put in ICU as she was also hypotensive and started on pressors. Patient was also shocked for VT. She developed progressive renal failure, oliguria. She was weaned off of pressors and extubated. And TRH assumed care. Started on antibiotics for possible pneumonia. Patient now with prolonged postop ileus, ongoing TNA, diet gradually being advanced per speech therapy. She was also noted to have a self-limiting GI bleed. Patient had acute respiratory distress on 09/01/2018 with decompensated CHF and upper airway wheezing. PCCM consulted.  Patient with spike in leukocytosis. Will pan-culture, obtain CXR,CT abd, etc. Surgerystill following.  09/12/18: Seen and  examined at her bedside.  She is alert but confused.  She denies any pain.  Does not appear in distress.  Assessment/Plan: Principal Problem:   CAP (community acquired pneumonia) Active Problems:   Dyslipidemia   Anemia of chronic disease   CAD S/P remote PCI- no details   Chronic combined systolic and diastolic CHF (congestive heart failure) (HCC)   AKI (acute kidney injury) (HCC)   SBO (small bowel obstruction) (HCC)   Pressure injury of skin   Acute renal failure superimposed on stage 3 chronic kidney disease (HCC)   Wide-complex tachycardia (HCC)   Protein-calorie malnutrition, severe   Acute respiratory failure with hypoxia (HCC)   Goals of care, counseling/discussion   Palliative care by specialist  Acute hypoxic respiratory failure likely multifactorial secondary to decompensated CHF and a component of bronchospasm Initially admitted for community-acquired pneumonia, completed her course of antibiotics PCCM consulted was briefly on BiPAP Responded well to IV Lasix and Solu-Medrol Continue bronchodilators and BiPAP as needed Weaning off Solu-Medrol, currently on 40 mg daily  Small bowel obstruction/ischemic colon Status post ex lap, appendectomy/Post op ileus General surgery following Patient failed [facility] Status post exploratory laparotomy for SBO and small bowel resection of ischemic necrotic bowel Completed 10 days of IV Zosyn Wound VAC removed on 08/27/2018 Wound dressing as recommended by general surgery  Resolved septic shock Post VDR F and pressure support Extubated on 08/24/2018 Weaned off pressors on 08/23/2018  Leukocytosis likely reactive from steroid use versus recent surgery On IV Solu-Medrol WBC improving from 15 K to 14 K No sign of active infective process  Hypovolemic hypernatremia Sodium as high as 156 on 09/10/2018 Improving on D5 at 100 cc/h Reduce rate to 50 cc/h to avoid rapid correction   Repeat BMP  Elevated LFTs Alkaline phosphatase, AST and ALT  elevated Levels are trending down  CKD 3 Creatinine today appears to be at baseline 1.7 with GFR of 32 Avoid nephrotoxic agents Monitor urine output Repeat BMP in the morning  Physical debility/ambulatory dysfunction PT OT recommended SNF Fall precautions  DVT prophylaxis: SCD, will start heparin Code Status: full  Family Communication:  discussed with husband Disposition Plan: pending  Consultants:   Palliative care  Surgery  Cardiology  Procedures:  Cardioversion 08/20/2018  Ex laparotomy with lysis of adhesion, bowel resection for ischemic bowel    Objective: Vitals:   09/12/18 0251 09/12/18 0443 09/12/18 0818 09/12/18 1203  BP:  (!) 138/57 (!) 113/48 (!) 114/51  Pulse:  71 70 69  Resp:  14 14 15   Temp:  97.8 F (36.6 C) (!) 96.5 F (35.8 C) (!) 96.2 F (35.7 C)  TempSrc:  Oral Axillary Axillary  SpO2:  98% 100% 97%  Weight: 61.7 kg     Height:        Intake/Output Summary (Last 24 hours) at 09/12/2018 1328 Last data filed at 09/12/2018 1203 Gross per 24 hour  Intake 2386.39 ml  Output 1800 ml  Net 586.39 ml   Filed Weights   09/05/18 0325 09/09/18 0645 09/12/18 0251  Weight: 68.8 kg 62.6 kg 61.7 kg    Exam:  . General: 78 y.o. year-old female well developed well nourished in no acute distress.  Alert and confused. . Cardiovascular: Regular rate and rhythm with no rubs or gallops.  No thyromegaly or JVD noted.   [name] Respiratory: Clear to auscultation with no wheezes or rales. Good inspiratory effort. . Abdomen: Soft nontender nondistended with normal bowel sounds.  Surgical dressing in place anterior abdomen. . Musculoskeletal: Trace lower extremity edema. 2/4 pulses in all 4 extremities. [name] Psychiatry: Mood is appropriate for condition and setting   Data Reviewed: CBC: Recent Labs  Lab 09/08/18 0500 09/09/18 0320 09/10/18 0545 09/11/18 0500 09/12/18 0236  WBC 17.2* 26.7*  18.1* 15.0* 14.1*  NEUTROABS 13.4*  --   --   --   --   HGB 8.5* 8.5* 7.9* 8.4* 8.0*  HCT 28.0* 27.5* 26.7* 27.9* 26.9*  MCV 89.7 90.5 93.4 93.9 91.8  PLT 285 250 214 194 469   Basic Metabolic Panel: Recent Labs  Lab 09/08/18 0500  09/10/18 0545 09/10/18 2252 09/11/18 0500 09/11/18 1700 09/12/18 0236  NA 152*   < > 156* 155* 155* 148* 145  K 3.8   < > 4.2 4.3 4.2 4.5 4.4  CL 105   < > 111 112* 112* 110 108  CO2 33*   < > 30 31 29 27 27   GLUCOSE 99   < > 112* 91 212* 399* 94  BUN 161*   < > 154* 143* 139* 121* 110*  CREATININE 2.04*   < > 1.96* 1.91* 1.91* 1.83* 1.71*  CALCIUM 8.6*   < > 8.7* 8.8* 8.8* 8.2* 8.4*  MG 2.1  --  2.4  --  2.4  --  2.2  PHOS 6.0*  --  5.0*  --  5.3*  --   --    < > = values in this interval not displayed.   GFR: Estimated Creatinine Clearance: 23 mL/min (A) (by C-G formula based on SCr of 1.71 mg/dL (H)). Liver Function Tests: Recent Labs  Lab 09/08/18 0500 09/11/18 0500 09/12/18 0236  AST 49* 41 42*  ALT 66* 56* 56*  ALKPHOS 174* 155* 151*  BILITOT 3.6* 4.1* 4.2*  PROT 5.8* 5.7* 5.4*  ALBUMIN 1.8* 1.8* 1.7*   No results for input(s): LIPASE, AMYLASE in the last 168 hours. No results for input(s): AMMONIA in the last 168 hours. Coagulation Profile: No results for input(s): INR, PROTIME in the last 168 hours. Cardiac Enzymes: No results for input(s): CKTOTAL, CKMB, CKMBINDEX, TROPONINI in the last 168 hours. BNP (last 3 results) No results for input(s): PROBNP in the last 8760 hours. HbA1C: No results for input(s): HGBA1C in the last 72 hours. CBG: Recent Labs  Lab 09/11/18 2120 09/12/18 0159 09/12/18 0637 09/12/18 0810 09/12/18 1147  GLUCAP 221* 85 214* 161* 136*   Lipid Profile: No results for input(s): CHOL, HDL, LDLCALC, TRIG, CHOLHDL, LDLDIRECT in the last 72 hours. Thyroid Function Tests: No results for input(s): TSH, T4TOTAL, FREET4, T3FREE, THYROIDAB in the last 72 hours. Anemia Panel: No results for input(s):  VITAMINB12, FOLATE, FERRITIN, TIBC, IRON, RETICCTPCT in the last 72 hours. Urine analysis:    Component Value Date/Time   COLORURINE YELLOW 09/09/2018 0939   APPEARANCEUR CLEAR 09/09/2018 0939   LABSPEC 1.010 09/09/2018 0939   PHURINE 5.0 09/09/2018 0939   GLUCOSEU NEGATIVE 09/09/2018 0939   HGBUR NEGATIVE 09/09/2018 0939   BILIRUBINUR NEGATIVE 09/09/2018 0939   KETONESUR NEGATIVE 09/09/2018 0939   PROTEINUR NEGATIVE 09/09/2018 0939   UROBILINOGEN 0.2 07/17/2013 2240   NITRITE NEGATIVE 09/09/2018 0939   LEUKOCYTESUR NEGATIVE 09/09/2018 0939   Sepsis Labs: @LABRCNTIP (procalcitonin:4,lacticidven:4)  ) Recent Results (from the past 240 hour(s))  Culture, blood (routine x 2)     Status: None (Preliminary result)   Collection Time: 09/09/18  7:22 AM  Result Value Ref Range Status   Specimen Description BLOOD RIGHT ANTECUBITAL  Final   Special Requests   Final    BOTTLES DRAWN AEROBIC ONLY Blood Culture results may not be optimal due to an inadequate volume of blood received in culture bottles   Culture   Final    NO GROWTH 3 DAYS Performed at [facility], [place] [address], [place], [place] [address]    Report Status PENDING  Incomplete  Culture, blood (routine x 2)     Status: None (Preliminary result)   Collection Time: 09/09/18  7:28 AM  Result Value Ref Range Status   Specimen Description BLOOD RIGHT HAND  Final   Special Requests   Final    BOTTLES DRAWN AEROBIC ONLY Blood Culture adequate volume   Culture   Final    NO GROWTH 3 DAYS Performed at [facility], [place] [address] [place], [place] [address]    Report Status PENDING  Incomplete  Culture, Urine     Status: Abnormal   Collection Time: 09/09/18 10:30 AM  Result Value Ref Range Status   Specimen Description URINE, CLEAN CATCH  Final   Special Requests   Final    NONE Performed at [facility], [place] [address] [place], [place] [address]    Culture (A)  Final    30,000 COLONIES/mL ESCHERICHIA  COLI Confirmed Extended Spectrum Beta-Lactamase Producer (ESBL).  In bloodstream infections from ESBL organisms, carbapenems are preferred over piperacillin/tazobactam. They are shown to have a lower risk of mortality.    Report Status 09/12/2018 FINAL  Final   Organism ID, Bacteria ESCHERICHIA COLI (A)  Final      Susceptibility   Escherichia coli - MIC*    AMPICILLIN >=32 RESISTANT Resistant     CEFAZOLIN >=64 RESISTANT Resistant     CEFTRIAXONE >=64 RESISTANT Resistant     CIPROFLOXACIN >=4  RESISTANT Resistant     GENTAMICIN >=16 RESISTANT Resistant     IMIPENEM <=0.25 SENSITIVE Sensitive     NITROFURANTOIN <=16 SENSITIVE Sensitive     TRIMETH/SULFA <=20 SENSITIVE Sensitive     AMPICILLIN/SULBACTAM >=32 RESISTANT Resistant     PIP/TAZO 64 INTERMEDIATE Intermediate     Extended ESBL POSITIVE Resistant     * 30,000 COLONIES/mL ESCHERICHIA COLI      Studies: No results found.  Scheduled Meds: . amiodarone  200 mg Oral Daily  . carvedilol  3.125 mg Oral BID WC  . chlorhexidine  15 mL Mouth Rinse BID  . feeding supplement (ENSURE ENLIVE)  237 mL Oral BID BM  . heparin injection (subcutaneous)  5,000 Units Subcutaneous Q8H  . insulin aspart  0-20 Units Subcutaneous Q4H  . mouth rinse  15 mL Mouth Rinse q12n4p  . methylPREDNISolone (SOLU-MEDROL) injection  40 mg Intravenous Daily  . mupirocin ointment   Nasal BID  . pantoprazole (PROTONIX) IV  40 mg Intravenous QHS  . saccharomyces boulardii  250 mg Oral BID    Continuous Infusions: . dextrose 100 mL/hr at 09/11/18 2323  . TPN ADULT (ION) 30 mL/hr at 09/12/18 0000  . TPN ADULT (ION)       LOS: 30 days     Kayleen Memos, MD [facility] Pager [phone]  If 7PM-7AM, please contact night-coverage [email or web address] Password [email or web address] 09/12/2018, 1:28 PM

## 2018-09-12 NOTE — Progress Notes (Signed)
PHARMACY - ADULT TOTAL PARENTERAL NUTRITION CONSULT NOTE   Pharmacy Consult for TPN Indication: s/p ex lap with SBR for SBO and ischemic/necroticbowel  Patient Measurements: Height: _0  (162.6 cm) Weight: 136 lb 0.4 oz (61.7 kg) IBW/kg (Calculated) : 54.7 TPN AdjBW (KG): 64.8 Body mass index is 23.35 kg/m.  Assessment:  78 year old female with PMH significant for anemia, CAD s/p AICD placement, breast cancer, CKD III, CHF, HLD, impaired hearing, and RA. She presented to [facility] on 4/23 with CAP and SBO. CT on 4/28 show persistent SBO and patient was transferred to [facility] for surgery. On 4/30, an ex lap, lysis of adhesions and small bowel resection was performed. As of 5/4, Patient has been ~12 days without nutrition and pharmacy was consulted 5/4 for TPN with plan for continued bowel rest and NG until bowel function returns.  GI: NGT removed 5/5, JP drain none documented. PPI IV. Pre-albumin up 8.6 >> 33.2 >>34.4 (meeting protein goals). No N/V. +Flatus. LBM 5/21. Dyphagia 1 diet -Calorie count initiated x48 hours.  Endo: No history of diabetes. CBGs were improving with reduced steroids - 94 low, up to 399 after AM steroid dose yesterday watch trend. Insulin requirements in the past 24 hours: 40 units of resistant SSI Lytes: Na 145 (sodium removed from TPN) ; K 4.4; Phos elevated at 5.3 (removed from TPN), Mg 2.2.  Renal: Scr 1.71(trending down), off CRRT - no plan for dialysis. BUN down to 110 (protein reduced in TPN). UOP 1.5 mL/kg/hr.  Pulm: Extubated on 5/3. Required BiPAP 5/12, now RA. Steroids weaning but CBGs still elevatyed + Lasix. Cards: Amiodarone po (s/p VT arrest earlier in admission); V-paced at 70, BP ok. Hepatobil: AST down to 42, ALT down 56. Tbili up 3.6>4.1>4.2. Alk Phos down to 174>155>151. TG 118. Neuro: oriented to person only, complains of abdominal pain ID: WBC down to 14.1 (steroids decreasing), afebrile off Zosyn. New blood and urine cultures re-sent 5/19.  TPN  Access: CVC triple lumen placed 4/30 TPN start date: 5/4 Nutritional Goals (per RD recommendation on 5/19): Kcal: 2000-2200 kcal Protein:  100-120 grams (decreased to 75 gms or 1 gm/kg due to rising BUN) Fluid:  >/= 2 L/day   Goal TPN rate is 60 ml/hr (provides 75 g of protein, 288 g of dextrose, and 60 g of lipids which provides 2002 kCals per day, meeting 100% of patient needs) -- reduced protein to 1 gm/kg due to rising BUN and concentrated to reduce volume.   Current Nutrition:  Dysphagia 1, thin liquid diet + Magic cup + Ensure Calorie Count: 53% kcal needs and 39% protein needs on 5/18 TPN at goal  Plan:  Continue TPN at half rate at 30 mL/hr per Surgery This TPN provides  37.4g of protein, 144g of dextrose, and 30g of lipids which provides 938 kCals per day, meeting 37% of protein needs and  47% of calorie needs.  Electrolytes in TPN: Continue to remove Na, Phos, and Ca. Continue current K and Mg, maximize Cl (gives 1:1.1 ratio based on low lytes in TPN) Add MVI to TPN - trace elements are on back order, will only place in TPN on Monday, Wednesday and Friday Continue resistant SSI Monitor TPN labs- repeat BMET, Mg and Phos in AM Follow-up diet/calorie counts and ability to wean TPN  Alanda Slim, PharmD, [facility] Clinical Pharmacist Please see AMION for all Pharmacists' Contact Phone Numbers 09/12/2018, 7:12 AM

## 2018-09-12 NOTE — Plan of Care (Signed)
  Problem: Health Behavior/Discharge Planning: Goal: Ability to manage health-related needs will improve Outcome: Progressing   Problem: Clinical Measurements: Goal: Ability to maintain clinical measurements within normal limits will improve Outcome: Progressing Goal: Will remain free from infection Outcome: Progressing Goal: Diagnostic test results will improve Outcome: Progressing   Problem: Activity: Goal: Risk for activity intolerance will decrease Outcome: Progressing   Problem: Nutrition: Goal: Adequate nutrition will be maintained Outcome: Progressing   Problem: Coping: Goal: Level of anxiety will decrease Outcome: Progressing   Problem: Skin Integrity: Goal: Risk for impaired skin integrity will decrease Outcome: Progressing   Problem: Activity: Goal: Ability to tolerate increased activity will improve Outcome: Progressing   Problem: Respiratory: Goal: Ability to maintain adequate ventilation will improve Outcome: Progressing Goal: Ability to maintain a clear airway will improve Outcome: Progressing

## 2018-09-12 NOTE — Progress Notes (Signed)
No ICM remote transmission received for 09/02/2018 due to hospitalization and next ICM transmission scheduled for 10/07/2018.

## 2018-09-12 NOTE — Plan of Care (Signed)
Care plan has been reviewed:  Problem: Activity: limited movement, required total care, inability to do adequate self care.  Goal: Risk for activity intolerance will decrease, Ability to tolerate increased activity will improve Outcome: Not Progressing: had already consulted palliative care. PT and OT had been followed up.   Problem: Nutrition: Pt had poor appetite, refused to eat.  Goal: Adequate nutrition will be maintained. Outcome: Not Progressing: TPN IV drip 30 ml /hr, adequate hydration, monitor CBG and BMP  per order . No signs and symptoms of hypoglycemia to night.  Problem: Clinical Measurements: opened abdominal wound, right IJ central venous catheter. Goal: Will remain free from infection Outcome: Progressing: right IJ dressing changed per protocol tonight. Abdominal wound dressing changed BID. Pt remained afebrile, hemodynamic remained stable.   Problem: Skin Integrity: encouraged SCD, Pt refused, education given, needed reinforcement.  Goal: Risk for impaired skin integrity will decrease Outcome: Progressing: protective barrier and sacral foam applied, changed position q 2 hrs. Pt had been evaluated and prevented for future pressure ulcer.   Forrest Moron, BSN,RN,PCCN-CMC,CSC

## 2018-09-12 NOTE — Progress Notes (Signed)
Inpatient Diabetes Program Recommendations  AACE/ADA: New Consensus Statement on Inpatient Glycemic Control (2015)  Target Ranges:  Prepandial:   less than 140 mg/dL      Peak postprandial:   less than 180 mg/dL (1-2 hours)      Critically ill patients:  140 - 180 mg/dL   Lab Results  Component Value Date   GLUCAP 161 (H) 09/12/2018   HGBA1C 5.8 (H) 09/03/2018    Review of Glycemic Control  Results for TELESIA, ATES (MRN [id number]) as of 09/12/2018 09:51  Ref. Range 09/11/2018 06:09 09/11/2018 18:02 09/11/2018 21:20 09/12/2018 01:59 09/12/2018 06:37 09/12/2018 08:10  Glucose-Capillary Latest Ref Range: 70 - 99 mg/dL 176 (H) 349 (H) 221 (H) 85 214 (H) 161 (H)   Inpatient Diabetes Program Recommendations:    Glucose trends range from 80-340's. Pt not eating however on TPN. Insulin not added in TPN dosing per pharmacy.   Patient currently on Novolog 0-20 units  Q4 hours. Patient also receiving IV Solumedrol 40 mg Daily.  Consider Lantus 6 units (0.1 units/kg) to hopefully stabilize glucose trends.  Thanks,  Tama Headings RN, MSN, BC-ADM Inpatient Diabetes Coordinator Team Pager [phone] (8a-5p)

## 2018-09-12 NOTE — Progress Notes (Addendum)
[facility]/Trauma Progress Note  22 Days Post-Op   Assessment/Plan Acute on chronic renal failure- Cr 1.71, improving  ICD/CAD/CHF-per cardiology Acute Respiratory Failure-resolved.  ABL Anemia-hgb stable  SBO&ischemic/necroticbowel - S/P ex lapw/SBR, LOA, appendectomy, 04/30, Dr. Marlou Starks, POD 21 -prealbumin 34 on 05/19, continue calorie count -completed zosyn x10 days, afebrile, WBC down to 14.1 and continues to trend down - Mobilize and IS - wound remains dehisced,mepitel and wet to dry - CT Abd/pel05/19 relatively unimpressive.  FEN -1/2 rateTPN,DYS1, calorie count  VTE - SCDs, okay for lovenox from our standpoint but will defer to medicine  ID -zosyn 4/30>>5/9. Foley - Removed 5/7 Follow up - Dr. Marlou Starks  Plan: continue dressing changes. Encourage PO intake. Continue calorie count. Palliative care following. Appreciate their assistance.   We will PRN for the weekend. Please page [place] with any concerns.    LOS: 30 days    Subjective: CC: no complaints  Pt states she does not want to eat.   Objective: Vital signs in last 24 hours: Temp:  [96.5 F (35.8 C)-98.3 F (36.8 C)] 96.5 F (35.8 C) (05/22 0818) Pulse Rate:  [70-75] 70 (05/22 0818) Resp:  [12-15] 14 (05/22 0818) BP: (113-138)/(48-68) 113/48 (05/22 0818) SpO2:  [97 %-100 %] 100 % (05/22 0818) Weight:  [61.7 kg] 61.7 kg (05/22 0251) Last BM Date: 09/12/18  Intake/Output from previous day: 05/21 0701 - 05/22 0700 In: 2476.4 [P.O.:190; I.V.:2286.4] Out: 2150 [Urine:2150] Intake/Output this shift: No intake/output data recorded.  PE: Gen:  Alert, NAD, pleasant, cooperative Pulm:  Rate and effort normal Abd: Soft, mild distention, +BS, see wound below. No feculent drainage noted but some purulent drainage noted at base of wound. No TTP. Skin: warm and dry      Anti-infectives: Anti-infectives (From admission, onward)   Start     Dose/Rate Route Frequency  Ordered Stop   08/23/18 1400  piperacillin-tazobactam (ZOSYN) IVPB 2.25 g     2.25 g 100 mL/hr over 30 Minutes Intravenous Every 8 hours 08/23/18 0928 08/30/18 2230   08/21/18 1200  piperacillin-tazobactam (ZOSYN) IVPB 3.375 g  Status:  Discontinued     3.375 g 12.5 mL/hr over 240 Minutes Intravenous Every 8 hours 08/21/18 1102 08/23/18 0928   08/14/18 1000  hydroxychloroquine (PLAQUENIL) tablet 200 mg  Status:  Discontinued    Note to Pharmacy:  Take 1 tablet by mouth daily Monday through Friday only     200 mg Oral Once per day on Mon Tue Wed Thu Fri 08/14/18 0742 08/21/18 1147   08/13/18 2200  doxycycline (VIBRA-TABS) tablet 100 mg  Status:  Discontinued     100 mg Oral Every 12 hours 08/13/18 2134 08/13/18 2135   08/13/18 2200  doxycycline (VIBRAMYCIN) 100 mg in sodium chloride 0.9 % 250 mL IVPB  Status:  Discontinued     100 mg 125 mL/hr over 120 Minutes Intravenous Every 12 hours 08/13/18 2135 08/19/18 1702   08/13/18 2145  cefTRIAXone (ROCEPHIN) 1 g in sodium chloride 0.9 % 100 mL IVPB  Status:  Discontinued     1 g 200 mL/hr over 30 Minutes Intravenous Every 24 hours 08/13/18 2134 08/14/18 0743      Lab Results:  Recent Labs    09/11/18 0500 09/12/18 0236  WBC 15.0* 14.1*  HGB 8.4* 8.0*  HCT 27.9* 26.9*  PLT 194 194   BMET Recent Labs    09/11/18 1700 09/12/18 0236  NA 148* 145  K 4.5 4.4  CL 110 108  CO2 27  27  GLUCOSE 399* 94  BUN 121* 110*  CREATININE 1.83* 1.71*  CALCIUM 8.2* 8.4*   PT/INR No results for input(s): LABPROT, INR in the last 72 hours. CMP     Component Value Date/Time   NA 145 09/12/2018 0236   K 4.4 09/12/2018 0236   CL 108 09/12/2018 0236   CO2 27 09/12/2018 0236   GLUCOSE 94 09/12/2018 0236   BUN 110 (H) 09/12/2018 0236   CREATININE 1.71 (H) 09/12/2018 0236   CREATININE 1.07 (H) 02/13/2018 1142   CALCIUM 8.4 (L) 09/12/2018 0236   PROT 5.4 (L) 09/12/2018 0236   ALBUMIN 1.7 (L) 09/12/2018 0236   AST 42 (H) 09/12/2018 0236   ALT  56 (H) 09/12/2018 0236   ALKPHOS 151 (H) 09/12/2018 0236   BILITOT 4.2 (H) 09/12/2018 0236   GFRNONAA 28 (L) 09/12/2018 0236   GFRNONAA 49 (L) 02/13/2018 1142   GFRAA 32 (L) 09/12/2018 0236   GFRAA 57 (L) 02/13/2018 1142   Lipase     Component Value Date/Time   LIPASE 30 08/13/2018 1926    Studies/Results: No results found.    Kalman Drape , [facility] 09/12/2018, 8:41 AM  Pager: [phone] Mon-Wed, Friday 7:00am-4:30pm Thurs 7am-11:30am  Consults: [phone]

## 2018-09-13 LAB — BASIC METABOLIC PANEL
Anion gap: 9 (ref 5–15)
BUN: 84 mg/dL — ABNORMAL HIGH (ref 8–23)
CO2: 25 mmol/L (ref 22–32)
Calcium: 8.3 mg/dL — ABNORMAL LOW (ref 8.9–10.3)
Chloride: 108 mmol/L (ref 98–111)
Creatinine, Ser: 1.5 mg/dL — ABNORMAL HIGH (ref 0.44–1.00)
GFR calc Af Amer: 38 mL/min — ABNORMAL LOW (ref >60)
GFR calc non Af Amer: 33 mL/min — ABNORMAL LOW (ref >60)
Glucose, Bld: 93 mg/dL (ref 70–99)
Potassium: 3.7 mmol/L (ref 3.5–5.1)
Sodium: 142 mmol/L (ref 135–145)

## 2018-09-13 LAB — CBC
HCT: 26.5 % — ABNORMAL LOW (ref 36.0–46.0)
Hemoglobin: 7.8 g/dL — ABNORMAL LOW (ref 12.0–15.0)
MCH: 27.3 pg (ref 26.0–34.0)
MCHC: 29.4 g/dL — ABNORMAL LOW (ref 30.0–36.0)
MCV: 92.7 fL (ref 80.0–100.0)
Platelets: 181 10*3/uL (ref 150–400)
RBC: 2.86 MIL/uL — ABNORMAL LOW (ref 3.87–5.11)
RDW: 21.8 % — ABNORMAL HIGH (ref 11.5–15.5)
WBC: 12.3 10*3/uL — ABNORMAL HIGH (ref 4.0–10.5)
nRBC: 0.2 % (ref 0.0–0.2)

## 2018-09-13 LAB — GLUCOSE, CAPILLARY
Glucose-Capillary: 104 mg/dL — ABNORMAL HIGH (ref 70–99)
Glucose-Capillary: 108 mg/dL — ABNORMAL HIGH (ref 70–99)
Glucose-Capillary: 139 mg/dL — ABNORMAL HIGH (ref 70–99)
Glucose-Capillary: 177 mg/dL — ABNORMAL HIGH (ref 70–99)
Glucose-Capillary: 226 mg/dL — ABNORMAL HIGH (ref 70–99)
Glucose-Capillary: 243 mg/dL — ABNORMAL HIGH (ref 70–99)
Glucose-Capillary: 96 mg/dL (ref 70–99)

## 2018-09-13 MED ORDER — STERILE WATER FOR INJECTION IV SOLN
INTRAVENOUS | Status: AC
  Administered 2018-09-13: 18:00:00 via INTRAVENOUS
  Filled 2018-09-13: qty 333.12

## 2018-09-13 NOTE — Plan of Care (Signed)
Care plans reviewed and patient is progressing.  

## 2018-09-13 NOTE — Progress Notes (Signed)
PROGRESS NOTE  [name] NFA:[id number] DOB: 30-Sep-1940 DOA: 08/13/2018 PCP: [name], MD  HPI/Recap of past 24 hours: HPI on 08/13/2018 by Dr. Bernadene Person [name] an 78 y.o.femalewith medical history significant ofanemia chronic disease, ASCVD, AICD placement, history of breast cancer, chronic bronchitis, stage III chronic renal disease, systolic heart failure, hyperlipidemia, GERD, history of GI bleed, gout, hypertension, rheumatoid arthritis, impaired hearing, history of UTI who is coming to the emergency department with complaints of abdominal pain apparently for the past 3 days associated with decreased appetite, several episodes of diarrhea, nausea, but no emesis. She denies fever, cough, sore throat, chest pain, dysuria, frequency, hematuria, melena or hematochezia. She denies polyuria, polydipsia, polyphagia or blurred vision. History is limited by the patient severe hearing impairment.  Interim history Patient was admitted with small bowel obstruction, failed conservative management and was transferred to [name]. General surgery consulted and appreciated, patient underwent ex lap with lysis of adhesions, bowel resection for ischemic bowel. She continued on the ventilator was put in ICU as she was also hypotensive and started on pressors. Patient was also shocked for VT. She developed progressive renal failure, oliguria. She was weaned off of pressors and extubated. And TR[facility] assumed care. Started on antibiotics for possible pneumonia. Patient now with prolonged postop ileus, ongoing TNA, diet gradually being advanced per speech therapy. She was also noted to have a self-limiting GI bleed. Patient had acute respiratory distress on 09/01/2018 with decompensated CHF and upper airway wheezing. PCCM consulted.  Patient with spike in leukocytosis. Will pan-culture, obtain CXR,CT abd, etc. Surgerystill following.  09/13/18: Patient seen  and examined at her bedside.  No acute events overnight.  She is more alert and interactive today.  She denies abdominal pain.   Assessment/Plan: Principal Problem:   CAP (community acquired pneumonia) Active Problems:   Dyslipidemia   Anemia of chronic disease   CAD S/P remote PCI- no details   Chronic combined systolic and diastolic CHF (congestive heart failure) (HCC)   AKI (acute kidney injury) (HCC)   SBO (small bowel obstruction) (HCC)   Pressure injury of skin   Acute renal failure superimposed on stage 3 chronic kidney disease (HCC)   Wide-complex tachycardia (HCC)   Protein-calorie malnutrition, severe   Acute respiratory failure with hypoxia (HCC)   Goals of care, counseling/discussion   Palliative care by specialist  Acute hypoxic respiratory failure likely multifactorial secondary to decompensated CHF and a component of bronchospasm Initially admitted for community-acquired pneumonia, completed her course of antibiotics PCCM consulted was briefly on BiPAP Responded well to IV Lasix and Solu-Medrol Continue bronchodilators and BiPAP as needed Weaning off Solu-Medrol, currently on 40 mg daily Vital signs reviewed and are stable  Small bowel obstruction/ischemic colon Status post ex lap, appendectomy/Post op ileus General surgery following Patient failed [facility] Status post exploratory laparotomy for SBO and small bowel resection of ischemic necrotic bowel Completed 10 days of IV Zosyn Wound VAC removed on 08/27/2018 Wound dressing as recommended by general surgery  Resolving hypovolemic hypernatremia Sodium as high as 156 on 09/10/2018 Improved on D5w Sodium 142 on 09/13/2018 DC D5W Repeat BMP in the morning  Resolved septic shock Post VDR F and pressure support Extubated on 08/24/2018 Weaned off pressors on 08/23/2018  Leukocytosis likely reactive from steroid use versus recent surgery On IV Solu-Medrol  WBC improving from 15 K to 14 K No sign of active infective process  Elevated  LFTs Alkaline phosphatase, AST and ALT elevated Levels are trending down  Resolving AKI on CKD 3 Creatinine today appears to be at baseline 1.5 with GFR of 38 Creatinine on 09/11/2018 was 1.83  Continue to avoid nephrotoxic agents Net -9.6 L 1350 cc urine output recorded in the last 24 hours Repeat BMP in the morning  Physical debility/ambulatory dysfunction PT OT recommended SNF Fall precautions  DVT prophylaxis: SCD, will start heparin Code Status: full  Family Communication:  discussed with husband Disposition Plan: pending  Consultants:   Palliative care  Surgery  Cardiology  Procedures:  Cardioversion 08/20/2018  Ex laparotomy with lysis of adhesion, bowel resection for ischemic bowel    Objective: Vitals:   09/12/18 1937 09/13/18 0001 09/13/18 0503 09/13/18 0900  BP: (!) 144/62 135/62 (!) 129/56   Pulse: 72  72   Resp: 20 18 17    Temp: 98.2 F (36.8 C) (!) 97.1 F (36.2 C) 98 F (36.7 C) 98 F (36.7 C)  TempSrc: Oral Oral Oral Oral  SpO2: 100% 100% 98%   Weight:      Height:        Intake/Output Summary (Last 24 hours) at 09/13/2018 1307 Last data filed at 09/13/2018 0932 Gross per 24 hour  Intake 401.81 ml  Output 950 ml  Net -548.19 ml   Filed Weights   09/05/18 0325 09/09/18 0645 09/12/18 0251  Weight: 68.8 kg 62.6 kg 61.7 kg    Exam:  . General: 78 y.o. year-old female well-developed well-nourished in no acute distress.  More alert and interactive today.   . Cardiovascular: Regular rate and rhythm with no rubs or gallops.  No JVD or thyromegaly noted.   [name] Respiratory: Clear to auscultation with no wheezes or rales.  Poor inspiratory effort. . Abdomen: Nontender surgical dressing in place.  .  Musculoskeletal: No lower extremity edema.  2 out of 4 pulses in all 4 extremities. [name] Psychiatry: Mood is appropriate for condition and setting.   Data Reviewed:  CBC: Recent Labs  Lab 09/08/18 0500 09/09/18 0320 09/10/18 0545 09/11/18 0500 09/12/18 0236 09/13/18 0950  WBC 17.2* 26.7* 18.1* 15.0* 14.1* 12.3*  NEUTROABS 13.4*  --   --   --   --   --   HGB 8.5* 8.5* 7.9* 8.4* 8.0* 7.8*  HCT 28.0* 27.5* 26.7* 27.9* 26.9* 26.5*  MCV 89.7 90.5 93.4 93.9 91.8 92.7  PLT 285 250 214 194 194 671   Basic Metabolic Panel: Recent Labs  Lab 09/08/18 0500  09/10/18 0545 09/10/18 2252 09/11/18 0500 09/11/18 1700 09/12/18 0236 09/13/18 0950  NA 152*   < > 156* 155* 155* 148* 145 142  K 3.8   < > 4.2 4.3 4.2 4.5 4.4 3.7  CL 105   < > 111 112* 112* 110 108 108  CO2 33*   < > 30 31 29 27 27 25   GLUCOSE 99   < > 112* 91 212* 399* 94 93  BUN 161*   < > 154* 143* 139* 121* 110* 84*  CREATININE 2.04*   < > 1.96* 1.91* 1.91* 1.83* 1.71* 1.50*  CALCIUM 8.6*   < > 8.7* 8.8* 8.8* 8.2* 8.4* 8.3*  MG 2.1  --  2.4  --  2.4  --  2.2  --   PHOS 6.0*  --  5.0*  --  5.3*  --   --   --    < > = values in this interval not displayed.   GFR: Estimated Creatinine  Clearance: 26.3 mL/min (A) (by C-G formula based on SCr of 1.5 mg/dL (H)). Liver Function Tests: Recent Labs  Lab 09/08/18 0500 09/11/18 0500 09/12/18 0236  AST 49* 41 42*  ALT 66* 56* 56*  ALKPHOS 174* 155* 151*  BILITOT 3.6* 4.1* 4.2*  PROT 5.8* 5.7* 5.4*  ALBUMIN 1.8* 1.8* 1.7*   No results for input(s): LIPASE, AMYLASE in the last 168 hours. No results for input(s): AMMONIA in the last 168 hours. Coagulation Profile: No results for input(s): INR, PROTIME in the last 168 hours. Cardiac Enzymes: No results for input(s): CKTOTAL, CKMB, CKMBINDEX, TROPONINI in the last 168 hours. BNP (last 3 results) No results for input(s): PROBNP in the last 8760 hours. HbA1C: No results for input(s): HGBA1C in the last 72 hours. CBG: Recent Labs  Lab 09/12/18 2004 09/12/18 2359 09/13/18 0011 09/13/18 0522 09/13/18 1006  GLUCAP 225* 108* 104* 139* 96   Lipid Profile: No results for input(s):  CHOL, HDL, LDLCALC, TRIG, CHOLHDL, LDLDIRECT in the last 72 hours. Thyroid Function Tests: No results for input(s): TSH, T4TOTAL, FREET4, T3FREE, THYROIDAB in the last 72 hours. Anemia Panel: No results for input(s): VITAMINB12, FOLATE, FERRITIN, TIBC, IRON, RETICCTPCT in the last 72 hours. Urine analysis:    Component Value Date/Time   COLORURINE YELLOW 09/09/2018 0939   APPEARANCEUR CLEAR 09/09/2018 0939   LABSPEC 1.010 09/09/2018 0939   PHURINE 5.0 09/09/2018 0939   GLUCOSEU NEGATIVE 09/09/2018 0939   HGBUR NEGATIVE 09/09/2018 0939   BILIRUBINUR NEGATIVE 09/09/2018 0939   KETONESUR NEGATIVE 09/09/2018 0939   PROTEINUR NEGATIVE 09/09/2018 0939   UROBILINOGEN 0.2 07/17/2013 2240   NITRITE NEGATIVE 09/09/2018 0939   LEUKOCYTESUR NEGATIVE 09/09/2018 0939   Sepsis Labs: @LABRCNTIP (procalcitonin:4,lacticidven:4)  ) Recent Results (from the past 240 hour(s))  Culture, blood (routine x 2)     Status: None (Preliminary result)   Collection Time: 09/09/18  7:22 AM  Result Value Ref Range Status   Specimen Description BLOOD RIGHT ANTECUBITAL  Final   Special Requests   Final    BOTTLES DRAWN AEROBIC ONLY Blood Culture results may not be optimal due to an inadequate volume of blood received in culture bottles   Culture   Final    NO GROWTH 3 DAYS Performed at [facility], [place] [address], [place], [place] [address]    Report Status PENDING  Incomplete  Culture, blood (routine x 2)     Status: None (Preliminary result)   Collection Time: 09/09/18  7:28 AM  Result Value Ref Range Status   Specimen Description BLOOD RIGHT HAND  Final   Special Requests   Final    BOTTLES DRAWN AEROBIC ONLY Blood Culture adequate volume   Culture   Final    NO GROWTH 3 DAYS Performed at [facility], [place] [address], [place], [place] [address]    Report Status PENDING  Incomplete  Culture, Urine     Status: Abnormal   Collection Time: 09/09/18 10:30 AM  Result Value Ref Range Status    Specimen Description URINE, CLEAN CATCH  Final   Special Requests   Final    NONE Performed at [facility], [place] [address] [place], [place] [address]    Culture (A)  Final    30,000 COLONIES/mL ESCHERICHIA COLI Confirmed Extended Spectrum Beta-Lactamase Producer (ESBL).  In bloodstream infections from ESBL organisms, carbapenems are preferred over piperacillin/tazobactam. They are shown to have a lower risk of mortality.    Report Status 09/12/2018 FINAL  Final   Organism ID, Bacteria ESCHERICHIA COLI (A)  Final      Susceptibility   Escherichia coli - MIC*    AMPICILLIN >=32 RESISTANT Resistant     CEFAZOLIN >=64 RESISTANT Resistant     CEFTRIAXONE >=64 RESISTANT Resistant     CIPROFLOXACIN >=4 RESISTANT Resistant     GENTAMICIN >=16 RESISTANT Resistant     IMIPENEM <=0.25 SENSITIVE Sensitive     NITROFURANTOIN <=16 SENSITIVE Sensitive     TRIMETH/SULFA <=20 SENSITIVE Sensitive     AMPICILLIN/SULBACTAM >=32 RESISTANT Resistant     PIP/TAZO 64 INTERMEDIATE Intermediate     Extended ESBL POSITIVE Resistant     * 30,000 COLONIES/mL ESCHERICHIA COLI      Studies: No results found.  Scheduled Meds: . amiodarone  200 mg Oral Daily  . carvedilol  3.125 mg Oral BID WC  . chlorhexidine  15 mL Mouth Rinse BID  . feeding supplement (ENSURE ENLIVE)  237 mL Oral BID BM  . heparin injection (subcutaneous)  5,000 Units Subcutaneous Q8H  . insulin aspart  0-20 Units Subcutaneous Q4H  . mouth rinse  15 mL Mouth Rinse q12n4p  . methylPREDNISolone (SOLU-MEDROL) injection  40 mg Intravenous Daily  . mupirocin ointment   Nasal BID  . pantoprazole (PROTONIX) IV  40 mg Intravenous QHS  . saccharomyces boulardii  250 mg Oral BID    Continuous Infusions: . TPN ADULT (ION) Stopped (09/12/18 1740)  . TPN ADULT (ION)       LOS: 31 days     Kayleen Memos, MD [facility] Pager [phone]  If 7PM-7AM, please contact night-coverage [email or web address] Password [email or web address] 09/13/2018,  1:07 PM

## 2018-09-13 NOTE — Progress Notes (Signed)
PHARMACY - ADULT TOTAL PARENTERAL NUTRITION CONSULT NOTE   Pharmacy Consult for TPN Indication: s/p ex lap with SBR for SBO and ischemic/necroticbowel  Patient Measurements: Height: '5\' 4"'  (162.6 cm) Weight: 136 lb 0.4 oz (61.7 kg) IBW/kg (Calculated) : 54.7 TPN AdjBW (KG): 64.8 Body mass index is 23.35 kg/m.  Assessment:  78 year old female with PMH significant for anemia, CAD s/p AICD placement, breast cancer, CKD III, CHF, HLD, impaired hearing, and RA. She presented to [facility] on 4/23 with CAP and SBO. CT on 4/28 show persistent SBO and patient was transferred to [facility] for surgery. On 4/30, an ex lap, lysis of adhesions and small bowel resection was performed. As of 5/4, Patient has been ~12 days without nutrition and pharmacy was consulted 5/4 for TPN with plan for continued bowel rest and NG until bowel function returns.  GI: NGT removed 5/5, JP drain none documented. PPI IV. Pre-albumin up 8.6 >> 33.2 >>34.4 (meeting protein goals). No N/V. +Flatus. LBM 5/21. Dyphagia 1 diet -Calorie count initiated x48 hours.  Endo: No history of diabetes. CBGs were improving with reduced steroids - 139 low, up to 225 (on weaning steroid dose) Insulin requirements in the past 24 hours: 18 units of resistant SSI Lytes: Na 142 (sodium removed from TPN) ; K 3.7; Phos elevated at 5.3 (removed from TPN), Mg 2.2.  Renal: Scr 1.71>1.5(trending down)- no plan for dialysis. BUN down to 110>85 (protein reduced in TPN). UOP 0.9 mL/kg/hr.  Pulm: Extubated on 5/3. Required BiPAP 5/12, now RA. Steroids weaning but CBGs still elevatyed + Lasix. Cards: Amiodarone po (s/p VT arrest earlier in admission); V-paced at 70, BP ok. Hepatobil: AST down to 42, ALT down 56. Tbili up 3.6>4.1>4.2. Alk Phos down to 174>155>151. TG 118. Neuro: oriented to person only, complains of abdominal pain ID: WBC down to 14.1 (steroids decreasing), afebrile off Zosyn. New blood and urine cultures re-sent 5/19.  TPN Access: CVC triple lumen  placed 4/30 TPN start date: 5/4 Nutritional Goals (per RD recommendation on 5/19): Kcal: 2000-2200 kcal Protein:  100-120 grams (increased back to 100 gms due to improved BUN) Fluid:  >/= 2 L/day   Goal TPN rate is 60 ml/hr (provides 100 g of protein, 288 g of dextrose, and 60 g of lipids which provides 2002 kCals per day, meeting 100% of patient needs) -- reduced protein to 1 gm/kg due to rising BUN and concentrated to reduce volume.   Current Nutrition:  Dysphagia 1, pureed liquid diet + Magic cup + Ensure - took in 2 Ensure and 50% of 1 meal yesterday  Calorie Count: 53% kcal needs and 39% protein needs on 5/18 TPN at goal  Plan:  Continue TPN at half rate at 30 mL/hr per Surgery This TPN provides  50g of protein, 144g of dextrose, and 30g of lipids which provides 938 kCals per day, meeting 50% of protein and calorie needs.  Electrolytes in TPN: Continue to remove Na, Phos, and Ca. Continue current K and Mg, maximize Cl (gives 1:1.1 ratio based on low lytes in TPN) Add MVI to TPN - trace elements are on back order, will only place in TPN on Monday, Wednesday and Friday Continue resistant SSI Monitor TPN labs Follow-up diet/calorie counts, BUN and ability to wean TPN  Alanda Slim, PharmD, [facility] Clinical Pharmacist Please see AMION for all Pharmacists' Contact Phone Numbers 09/13/2018, 7:41 AM

## 2018-09-14 LAB — BASIC METABOLIC PANEL
Anion gap: 10 (ref 5–15)
BUN: 85 mg/dL — ABNORMAL HIGH (ref 8–23)
CO2: 23 mmol/L (ref 22–32)
Calcium: 8.5 mg/dL — ABNORMAL LOW (ref 8.9–10.3)
Chloride: 112 mmol/L — ABNORMAL HIGH (ref 98–111)
Creatinine, Ser: 1.62 mg/dL — ABNORMAL HIGH (ref 0.44–1.00)
GFR calc Af Amer: 35 mL/min — ABNORMAL LOW (ref >60)
GFR calc non Af Amer: 30 mL/min — ABNORMAL LOW (ref >60)
Glucose, Bld: 129 mg/dL — ABNORMAL HIGH (ref 70–99)
Potassium: 3.9 mmol/L (ref 3.5–5.1)
Sodium: 145 mmol/L (ref 135–145)

## 2018-09-14 LAB — GLUCOSE, CAPILLARY
Glucose-Capillary: 141 mg/dL — ABNORMAL HIGH (ref 70–99)
Glucose-Capillary: 175 mg/dL — ABNORMAL HIGH (ref 70–99)
Glucose-Capillary: 246 mg/dL — ABNORMAL HIGH (ref 70–99)
Glucose-Capillary: 326 mg/dL — ABNORMAL HIGH (ref 70–99)
Glucose-Capillary: 79 mg/dL (ref 70–99)

## 2018-09-14 LAB — CBC WITH DIFFERENTIAL/PLATELET
Abs Immature Granulocytes: 0.23 10*3/uL — ABNORMAL HIGH (ref 0.00–0.07)
Basophils Absolute: 0 10*3/uL (ref 0.0–0.1)
Basophils Relative: 0 %
Eosinophils Absolute: 0 10*3/uL (ref 0.0–0.5)
Eosinophils Relative: 0 %
HCT: 27.7 % — ABNORMAL LOW (ref 36.0–46.0)
Hemoglobin: 8.2 g/dL — ABNORMAL LOW (ref 12.0–15.0)
Immature Granulocytes: 2 %
Lymphocytes Relative: 10 %
Lymphs Abs: 1.2 10*3/uL (ref 0.7–4.0)
MCH: 27.7 pg (ref 26.0–34.0)
MCHC: 29.6 g/dL — ABNORMAL LOW (ref 30.0–36.0)
MCV: 93.6 fL (ref 80.0–100.0)
Monocytes Absolute: 1.1 10*3/uL — ABNORMAL HIGH (ref 0.1–1.0)
Monocytes Relative: 9 %
Neutro Abs: 9.6 10*3/uL — ABNORMAL HIGH (ref 1.7–7.7)
Neutrophils Relative %: 79 %
Platelets: 190 10*3/uL (ref 150–400)
RBC: 2.96 MIL/uL — ABNORMAL LOW (ref 3.87–5.11)
RDW: 22 % — ABNORMAL HIGH (ref 11.5–15.5)
WBC: 12.1 10*3/uL — ABNORMAL HIGH (ref 4.0–10.5)
nRBC: 0.2 % (ref 0.0–0.2)

## 2018-09-14 LAB — CULTURE, BLOOD (ROUTINE X 2)
Culture: NO GROWTH
Culture: NO GROWTH
Special Requests: ADEQUATE

## 2018-09-14 MED ORDER — INSULIN ASPART 100 UNIT/ML ~~LOC~~ SOLN
0.0000 [IU] | SUBCUTANEOUS | Status: DC
  Administered 2018-09-14: 21:00:00 15 [IU] via SUBCUTANEOUS
  Administered 2018-09-15: 3 [IU] via SUBCUTANEOUS
  Administered 2018-09-15 (×2): 4 [IU] via SUBCUTANEOUS
  Administered 2018-09-15: 09:00:00 3 [IU] via SUBCUTANEOUS
  Administered 2018-09-16: 21:00:00 4 [IU] via SUBCUTANEOUS
  Administered 2018-09-16: 17:00:00 7 [IU] via SUBCUTANEOUS
  Administered 2018-09-17 (×5): 4 [IU] via SUBCUTANEOUS
  Administered 2018-09-17: 09:00:00 3 [IU] via SUBCUTANEOUS
  Administered 2018-09-17: 17:00:00 11 [IU] via SUBCUTANEOUS
  Administered 2018-09-18: 08:00:00 3 [IU] via SUBCUTANEOUS
  Administered 2018-09-18: 4 [IU] via SUBCUTANEOUS
  Administered 2018-09-18: 7 [IU] via SUBCUTANEOUS
  Administered 2018-09-18: 3 [IU] via SUBCUTANEOUS
  Administered 2018-09-18: 17:00:00 11 [IU] via SUBCUTANEOUS
  Administered 2018-09-19 (×2): 7 [IU] via SUBCUTANEOUS
  Administered 2018-09-19: 4 [IU] via SUBCUTANEOUS

## 2018-09-14 MED ORDER — LACTATED RINGERS IV SOLN: INTRAVENOUS | Status: DC

## 2018-09-14 MED ORDER — LACTATED RINGERS IV SOLN
INTRAVENOUS | Status: DC
  Administered 2018-09-14 – 2018-09-15 (×2): via INTRAVENOUS

## 2018-09-14 MED ORDER — PREDNISONE 20 MG PO TABS
40.0000 mg | ORAL_TABLET | Freq: Every day | ORAL | Status: DC
  Administered 2018-09-14 – 2018-09-16 (×3): 40 mg via ORAL
  Filled 2018-09-14 (×3): qty 2

## 2018-09-14 MED ORDER — PANTOPRAZOLE SODIUM 40 MG PO TBEC
40.0000 mg | DELAYED_RELEASE_TABLET | Freq: Every day | ORAL | Status: DC
  Administered 2018-09-14 – 2018-09-19 (×6): 40 mg via ORAL
  Filled 2018-09-14 (×6): qty 1

## 2018-09-14 MED ORDER — STERILE WATER FOR INJECTION IV SOLN
INTRAVENOUS | Status: AC
  Administered 2018-09-14: 18:00:00 via INTRAVENOUS
  Filled 2018-09-14: qty 333.12

## 2018-09-14 NOTE — Progress Notes (Signed)
Patient abdominal wound dressing changed as ordered at 0400 this shift, wound moist and pink with moderate drainage, will continue to monitor, thanks.

## 2018-09-14 NOTE — Progress Notes (Signed)
Spoke with patient RN, Clarene Critchley, regarding RIJ-TL CVC, 2 ports occluded. Staff nurse  to notify MD that  The RIJ- CVC was placed on 08/21/2018. Recommended replacing central line due to risk of CLABSI.

## 2018-09-14 NOTE — Progress Notes (Signed)
PHARMACY - ADULT TOTAL PARENTERAL NUTRITION CONSULT NOTE   Pharmacy Consult for TPN Indication: s/p ex lap with SBR for SBO and ischemic/necroticbowel  Patient Measurements: Height: 5' 4" (162.6 cm) Weight: 136 lb 0.4 oz (61.7 kg) IBW/kg (Calculated) : 54.7 TPN AdjBW (KG): 64.8 Body mass index is 23.35 kg/m.  Assessment:  78 year old female with PMH significant for anemia, CAD s/p AICD placement, breast cancer, CKD III, CHF, HLD, impaired hearing, and RA. She presented to [facility] on 4/23 with CAP and SBO. CT on 4/28 show persistent SBO and patient was transferred to [facility] for surgery. On 4/30, an ex lap, lysis of adhesions and small bowel resection was performed. As of 5/4, Patient has been ~12 days without nutrition and pharmacy was consulted 5/4 for TPN with plan for continued bowel rest and NG until bowel function returns.  GI: NGT removed 5/5, JP drain none documented. PPI IV. Pre-albumin up 8.6 >> 33.2 >>34.4 (meeting 50% protein goals). No N/V. +Flatus. LBM 5/21. Dyphagia 1 diet  Endo: No history of diabetes. CBGs were improving with reduced steroids - 79 low, up to 177 (on weaning steroid dose) Insulin requirements in the past 24 hours: 21 units of resistant SSI Lytes: Na 145 (sodium removed from TPN) ; K 3.9; Phos elevated at 5.3 (removed from TPN), Mg 2.2.  Renal: Scr 1.71>1.5>1.62- no plan for dialysis. BUN down to 110>85. UOP 0.5 mL/kg/hr + 2 unmeasured occurences.  Pulm: Extubated on 5/3. Required BiPAP 5/12, now RA. Steroids weaning but CBGs still elevatyed + Lasix. Cards: Amiodarone po (s/p VT arrest earlier in admission); V-paced at 70, BP ok. Hepatobil: AST down to 42, ALT down 56. Tbili up 3.6>4.1>4.2. Alk Phos down to 174>155>151. TG 118. Neuro: oriented to person only, complains of abdominal pain ID: WBC down to 12.1 (steroids decreasing), afebrile off Zosyn. New blood and urine cultures re-sent 5/19.  TPN Access: CVC triple lumen placed 4/30 TPN start date: 5/4 Nutritional  Goals (per RD recommendation on 5/19): Kcal: 2000-2200 kcal Protein:  100-120 grams (increased back to 100 gms due to improved BUN) Fluid:  >/= 2 L/day   Goal TPN rate is 60 ml/hr (provides 100 g of protein, 288 g of dextrose, and 60 g of lipids which provides 2002 kCals per day, meeting 100% of patient needs) -- reduced protein to 1 gm/kg due to rising BUN and concentrated to reduce volume.   Current Nutrition:  Dysphagia 1, pureed liquid diet + Magic cup + Ensure - took in 2 Ensure and 1 magic cup, no food  Calorie Count: 53% kcal needs and 39% protein needs on 5/18 TPN at goal  Plan:  Continue TPN at half rate at 30 mL/hr per Surgery This TPN provides  50g of protein, 144g of dextrose, and 30g of lipids which provides 938 kCals per day, meeting 50% of protein and calorie needs. Electrolytes in TPN: Continue to remove Na, Phos, and Ca. Continue current K and Mg, maximize Cl (gives 1:1.1 ratio based on low lytes in TPN) Add MVI to TPN - trace elements are on back order, will only place in TPN on Monday, Wednesday and Friday Continue resistant SSI Monitor TPN labs Follow-up diet/calorie counts, BUN and ability to wean TPN  Cathy Pierce, PharmD, [facility] Clinical Pharmacist Please see AMION for all Pharmacists' Contact Phone Numbers 09/14/2018, 7:59 AM        

## 2018-09-14 NOTE — Progress Notes (Signed)
PROGRESS NOTE  [name] SPQ:[id number] DOB: Sep 25, 1940 DOA: 08/13/2018 PCP: [name], MD  HPI/Recap of past 24 hours: HPI on 08/13/2018 by Dr. Bernadene Person [name] a 78 y.o.femalewith medical history significant ofanemia chronic disease, ASCVD, AICD placement, history of breast cancer, chronic bronchitis, stage III chronic renal disease, systolic heart failure, hyperlipidemia, GERD, history of GI bleed, gout, hypertension, rheumatoid arthritis, impaired hearing, history of UTI who is coming to the emergency department with complaints of abdominal pain apparently for the past 3 days associated with decreased appetite, several episodes of diarrhea, nausea, but no emesis. She denies fever, cough, sore throat, chest pain, dysuria, frequency, hematuria, melena or hematochezia. She denies polyuria, polydipsia, polyphagia or blurred vision. History is limited by the patient severe hearing impairment.  Interim history Patient was admitted with small bowel obstruction, failed conservative management and was transferred to [name]. General surgery consulted and appreciated, patient underwent ex lap with lysis of adhesions, bowel resection for ischemic bowel. She continued on the ventilator was put in ICU as she was also hypotensive and started on pressors. Patient was also shocked for VT. She developed progressive renal failure, oliguria. She was weaned off of pressors and extubated. And TRH assumed care. Started on antibiotics for possible pneumonia. Patient now with prolonged postop ileus, ongoing TNA, diet gradually being advanced per speech therapy. She was also noted to have a self-limiting GI bleed. Patient had acute respiratory distress on 09/01/2018 with decompensated CHF and upper airway wheezing. PCCM consulted.  General surgery following.  09/14/18: Patient seen and examined at her bedside.  No acute events overnight.  She is more alert today  and interactive.  She denies any abdominal pain or chest pain.  She has no new concerns.    Assessment/Plan: Principal Problem:   CAP (community acquired pneumonia) Active Problems:   Dyslipidemia   Anemia of chronic disease   CAD S/P remote PCI- no details   Chronic combined systolic and diastolic CHF (congestive heart failure) (HCC)   AKI (acute kidney injury) (HCC)   SBO (small bowel obstruction) (HCC)   Pressure injury of skin   Acute renal failure superimposed on stage 3 chronic kidney disease (HCC)   Wide-complex tachycardia (HCC)   Protein-calorie malnutrition, severe   Acute respiratory failure with hypoxia (HCC)   Goals of care, counseling/discussion   Palliative care by specialist  Acute hypoxic respiratory failure likely multifactorial secondary to decompensated CHF and a component of bronchospasm Initially admitted for community-acquired pneumonia, completed her course of antibiotics PCCM consulted was briefly on BiPAP Responded well to IV Lasix and Solu-Medrol Continue bronchodilators and BiPAP as needed Weaning off Solu-Medrol, switch to prednisone 40 mg daily Vital signs reviewed and are stable.  Small bowel obstruction/ischemic colon Status post ex lap, appendectomy/Post op ileus General surgery following Patient failed conservative [facility] Status post exploratory laparotomy for SBO and small bowel resection of ischemic necrotic bowel Completed 10 days of IV Zosyn Wound VAC removed on 08/27/2018 Wound dressing as recommended by general surgery  Resolving hypovolemic hypernatremia Sodium as high as 156 on 09/10/2018 Improved on D5w Sodium 142 on 09/13/2018 DC D5W on 09/13/2018 LR at 75 cc/h started on 09/14/2018 Repeat BMP in the morning  Resolving AKI on CKD 3 Creatinine at baseline 1.5 with GFR of 38 Creatinine on 09/11/2018 was 1.83 Creatinine today 1.62 on 09/14/2018 from 1.50 yesterday Restarted IV fluids LR at  75 cc/h normal Monitor  urine output Net I&O -12.1 L since admission Continue to avoid nephrotoxic agents Repeat BMP in the morning  Resolved septic shock Post VDR F and pressure support Extubated on 08/24/2018 Weaned off pressors on 08/23/2018  Leukocytosis likely reactive from steroid use versus recent surgery DC IV Solu-Medrol 40 mg daily and start prednisone 40 mg daily on 09/14/2018 WBC improving from 15 K to 14 K >>12K No sign of active infective process Afebrile  Elevated LFTs Alkaline phosphatase, AST and ALT elevated Levels are trending down  Physical debility/ambulatory dysfunction PT OT recommended SNF Fall precautions  DVT prophylaxis:  Subcu heparin 3 times daily Code Status: full  Disposition Plan:  Pending clinical improvement.  Consultants:   Palliative care  Surgery  Cardiology  Procedures:  Cardioversion 08/20/2018  Ex laparotomy with lysis of adhesion, bowel resection for ischemic bowel    Objective: Vitals:   09/13/18 2231 09/14/18 0025 09/14/18 0330 09/14/18 0800  BP: (!) 127/53 132/61 (!) 128/55 (!) 116/51  Pulse: 71 73 70 70  Resp: 18 18 17 15   Temp: 98.1 F (36.7 C) 98 F (36.7 C) 97.9 F (36.6 C) 98.2 F (36.8 C)  TempSrc: Oral Oral Oral Oral  SpO2: 99% 98% 98% 99%  Weight:      Height:        Intake/Output Summary (Last 24 hours) at 09/14/2018 1248 Last data filed at 09/14/2018 0859 Gross per 24 hour  Intake 464.12 ml  Output 1075 ml  Net -610.88 ml   Filed Weights   09/05/18 0325 09/09/18 0645 09/12/18 0251  Weight: 68.8 kg 62.6 kg 61.7 kg    Exam:  . General: 78 y.o. year-old female well-developed well-nourished in no acute distress.  More alert and interactive today. . Cardiovascular: Regular rate and rhythm with no rubs or gallops.  No JVD or thyromegaly noted. [name] Respiratory: Clear to Auscultation with No Wheezes or Rales.  Poor Inspiratory Effort. . Abdomen: Nontender abdomen.  Surgical dressing in place. .   Musculoskeletal: No lower extremity edema.  2 out of 4 pulses in all 4 extremities.   [name] Psychiatry: Mood is appropriate for condition and setting..   Data Reviewed: CBC: Recent Labs  Lab 09/08/18 0500  09/10/18 0545 09/11/18 0500 09/12/18 0236 09/13/18 0950 09/14/18 0906  WBC 17.2*   < > 18.1* 15.0* 14.1* 12.3* 12.1*  NEUTROABS 13.4*  --   --   --   --   --  9.6*  HGB 8.5*   < > 7.9* 8.4* 8.0* 7.8* 8.2*  HCT 28.0*   < > 26.7* 27.9* 26.9* 26.5* 27.7*  MCV 89.7   < > 93.4 93.9 91.8 92.7 93.6  PLT 285   < > 214 194 194 181 190   < > = values in this interval not displayed.   Basic Metabolic Panel: Recent Labs  Lab 09/08/18 0500  09/10/18 0545  09/11/18 0500 09/11/18 1700 09/12/18 0236 09/13/18 0950 09/14/18 0906  NA 152*   < > 156*   < > 155* 148* 145 142 145  K 3.8   < > 4.2   < > 4.2 4.5 4.4 3.7 3.9  CL 105   < > 111   < > 112* 110 108 108 112*  CO2 33*   < > 30   < > 29 27 27 25 23   GLUCOSE 99   < > 112*   < > 212* 399* 94 93 129*  BUN 161*   < > 154*   < >  139* 121* 110* 84* 85*  CREATININE 2.04*   < > 1.96*   < > 1.91* 1.83* 1.71* 1.50* 1.62*  CALCIUM 8.6*   < > 8.7*   < > 8.8* 8.2* 8.4* 8.3* 8.5*  MG 2.1  --  2.4  --  2.4  --  2.2  --   --   PHOS 6.0*  --  5.0*  --  5.3*  --   --   --   --    < > = values in this interval not displayed.   GFR: Estimated Creatinine Clearance: 24.3 mL/min (A) (by C-G formula based on SCr of 1.62 mg/dL (H)). Liver Function Tests: Recent Labs  Lab 09/08/18 0500 09/11/18 0500 09/12/18 0236  AST 49* 41 42*  ALT 66* 56* 56*  ALKPHOS 174* 155* 151*  BILITOT 3.6* 4.1* 4.2*  PROT 5.8* 5.7* 5.4*  ALBUMIN 1.8* 1.8* 1.7*   No results for input(s): LIPASE, AMYLASE in the last 168 hours. No results for input(s): AMMONIA in the last 168 hours. Coagulation Profile: No results for input(s): INR, PROTIME in the last 168 hours. Cardiac Enzymes: No results for input(s): CKTOTAL, CKMB, CKMBINDEX, TROPONINI in the last 168 hours. BNP  (last 3 results) No results for input(s): PROBNP in the last 8760 hours. HbA1C: No results for input(s): HGBA1C in the last 72 hours. CBG: Recent Labs  Lab 09/13/18 1655 09/13/18 2235 09/14/18 0023 09/14/18 0156 09/14/18 0513  GLUCAP 243* 177* 175* 141* 79   Lipid Profile: No results for input(s): CHOL, HDL, LDLCALC, TRIG, CHOLHDL, LDLDIRECT in the last 72 hours. Thyroid Function Tests: No results for input(s): TSH, T4TOTAL, FREET4, T3FREE, THYROIDAB in the last 72 hours. Anemia Panel: No results for input(s): VITAMINB12, FOLATE, FERRITIN, TIBC, IRON, RETICCTPCT in the last 72 hours. Urine analysis:    Component Value Date/Time   COLORURINE YELLOW 09/09/2018 0939   APPEARANCEUR CLEAR 09/09/2018 0939   LABSPEC 1.010 09/09/2018 0939   PHURINE 5.0 09/09/2018 0939   GLUCOSEU NEGATIVE 09/09/2018 0939   HGBUR NEGATIVE 09/09/2018 0939   BILIRUBINUR NEGATIVE 09/09/2018 0939   KETONESUR NEGATIVE 09/09/2018 0939   PROTEINUR NEGATIVE 09/09/2018 0939   UROBILINOGEN 0.2 07/17/2013 2240   NITRITE NEGATIVE 09/09/2018 0939   LEUKOCYTESUR NEGATIVE 09/09/2018 0939   Sepsis Labs: @LABRCNTIP (procalcitonin:4,lacticidven:4)  ) Recent Results (from the past 240 hour(s))  Culture, blood (routine x 2)     Status: None   Collection Time: 09/09/18  7:22 AM  Result Value Ref Range Status   Specimen Description BLOOD RIGHT ANTECUBITAL  Final   Special Requests   Final    BOTTLES DRAWN AEROBIC ONLY Blood Culture results may not be optimal due to an inadequate volume of blood received in culture bottles   Culture   Final    NO GROWTH 5 DAYS Performed at [facility], [place] [address] [place], [place] [address]    Report Status 09/14/2018 FINAL  Final  Culture, blood (routine x 2)     Status: None   Collection Time: 09/09/18  7:28 AM  Result Value Ref Range Status   Specimen Description BLOOD RIGHT HAND  Final   Special Requests   Final    BOTTLES DRAWN AEROBIC ONLY Blood Culture  adequate volume   Culture   Final    NO GROWTH 5 DAYS Performed at [facility], [place] [address] [place], [place] [address]    Report Status 09/14/2018 FINAL  Final  Culture, Urine  Status: Abnormal   Collection Time: 09/09/18 10:30 AM  Result Value Ref Range Status   Specimen Description URINE, CLEAN CATCH  Final   Special Requests   Final    NONE Performed at [facility], [address] [place], [place] [address]    Culture (A)  Final    30,000 COLONIES/mL ESCHERICHIA COLI Confirmed Extended Spectrum Beta-Lactamase Producer (ESBL).  In bloodstream infections from ESBL organisms, carbapenems are preferred over piperacillin/tazobactam. They are shown to have a lower risk of mortality.    Report Status 09/12/2018 FINAL  Final   Organism ID, Bacteria ESCHERICHIA COLI (A)  Final      Susceptibility   Escherichia coli - MIC*    AMPICILLIN >=32 RESISTANT Resistant     CEFAZOLIN >=64 RESISTANT Resistant     CEFTRIAXONE >=64 RESISTANT Resistant     CIPROFLOXACIN >=4 RESISTANT Resistant     GENTAMICIN >=16 RESISTANT Resistant     IMIPENEM <=0.25 SENSITIVE Sensitive     NITROFURANTOIN <=16 SENSITIVE Sensitive     TRIMETH/SULFA <=20 SENSITIVE Sensitive     AMPICILLIN/SULBACTAM >=32 RESISTANT Resistant     PIP/TAZO 64 INTERMEDIATE Intermediate     Extended ESBL POSITIVE Resistant     * 30,000 COLONIES/mL ESCHERICHIA COLI      Studies: No results found.  Scheduled Meds: . amiodarone  200 mg Oral Daily  . carvedilol  3.125 mg Oral BID WC  . chlorhexidine  15 mL Mouth Rinse BID  . feeding supplement (ENSURE ENLIVE)  237 mL Oral BID BM  . heparin injection (subcutaneous)  5,000 Units Subcutaneous Q8H  . insulin aspart  0-20 Units Subcutaneous Q4H  . mouth rinse  15 mL Mouth Rinse q12n4p  . methylPREDNISolone (SOLU-MEDROL) injection  40 mg Intravenous Daily  . mupirocin ointment   Nasal BID  . pantoprazole (PROTONIX) IV  40 mg Intravenous QHS  . saccharomyces  boulardii  250 mg Oral BID    Continuous Infusions: . lactated ringers    . TPN ADULT (ION) 30 mL/hr at 09/14/18 0400  . TPN ADULT (ION)       LOS: 32 days     Kayleen Memos, MD [facility] Pager [phone]  If 7PM-7AM, please contact night-coverage [email or web address] Password [facility] 09/14/2018, 12:48 PM

## 2018-09-14 NOTE — Plan of Care (Signed)
Care plan reviewed and will continue to monitor patient.

## 2018-09-14 NOTE — Progress Notes (Addendum)
IV access in the right IJ is almost 66 days old.  Two ports are occluded and unable to run any fluids through.  Per IV Team, this access is usually only used for 7-10 days and d/c'd.  Will need to consider replacing the IJ if still needed for TPN.  Peripheral IV was started today to run the LR as ordered. IV team replaced caps and attempted to flush ports today without success.  Thank You.

## 2018-09-14 NOTE — Progress Notes (Signed)
S: tolerating food, +BM O: BP (!) 116/51 (BP Location: Right Arm)   Pulse 70   Temp 98.2 F (36.8 C) (Oral)   Resp 15   Ht 5\' 4"  (1.626 m)   Wt 61.7 kg   SpO2 99%   BMI 23.35 kg/m  Gen: NAD Neuro: answers simple questions, slow Abd: abdominal wound open with dehisence, some fibrinous debris and visceral contents below level of incision  A/P 78 yo female with SBO with ischemic bowel now with dehisence Continue current care with dressing changes and diet

## 2018-09-14 NOTE — Progress Notes (Signed)
Inserted 22 G angiocath in the right lateral wrist on one attempt.  LR added at 75 cc hr via pump, as ordered.  Consult to IV team for assistance, no response.

## 2018-09-15 ENCOUNTER — Inpatient Hospital Stay: Payer: Self-pay

## 2018-09-15 LAB — GLUCOSE, CAPILLARY
Glucose-Capillary: 139 mg/dL — ABNORMAL HIGH (ref 70–99)
Glucose-Capillary: 144 mg/dL — ABNORMAL HIGH (ref 70–99)
Glucose-Capillary: 154 mg/dL — ABNORMAL HIGH (ref 70–99)
Glucose-Capillary: 167 mg/dL — ABNORMAL HIGH (ref 70–99)
Glucose-Capillary: 56 mg/dL — ABNORMAL LOW (ref 70–99)
Glucose-Capillary: 74 mg/dL (ref 70–99)
Glucose-Capillary: 99 mg/dL (ref 70–99)

## 2018-09-15 LAB — COMPREHENSIVE METABOLIC PANEL
ALT: 75 U/L — ABNORMAL HIGH (ref 0–44)
AST: 60 U/L — ABNORMAL HIGH (ref 15–41)
Albumin: 1.7 g/dL — ABNORMAL LOW (ref 3.5–5.0)
Alkaline Phosphatase: 150 U/L — ABNORMAL HIGH (ref 38–126)
Anion gap: 13 (ref 5–15)
BUN: 88 mg/dL — ABNORMAL HIGH (ref 8–23)
CO2: 17 mmol/L — ABNORMAL LOW (ref 22–32)
Calcium: 8.5 mg/dL — ABNORMAL LOW (ref 8.9–10.3)
Chloride: 115 mmol/L — ABNORMAL HIGH (ref 98–111)
Creatinine, Ser: 1.49 mg/dL — ABNORMAL HIGH (ref 0.44–1.00)
GFR calc Af Amer: 38 mL/min — ABNORMAL LOW (ref >60)
GFR calc non Af Amer: 33 mL/min — ABNORMAL LOW (ref >60)
Glucose, Bld: 90 mg/dL (ref 70–99)
Potassium: 5.5 mmol/L — ABNORMAL HIGH (ref 3.5–5.1)
Sodium: 145 mmol/L (ref 135–145)
Total Bilirubin: 4.6 mg/dL — ABNORMAL HIGH (ref 0.3–1.2)
Total Protein: 5.5 g/dL — ABNORMAL LOW (ref 6.5–8.1)

## 2018-09-15 LAB — CBC WITH DIFFERENTIAL/PLATELET
Abs Immature Granulocytes: 0 10*3/uL (ref 0.00–0.07)
Basophils Absolute: 0 10*3/uL (ref 0.0–0.1)
Basophils Relative: 0 %
Eosinophils Absolute: 0 10*3/uL (ref 0.0–0.5)
Eosinophils Relative: 0 %
HCT: 27.5 % — ABNORMAL LOW (ref 36.0–46.0)
Hemoglobin: 8.4 g/dL — ABNORMAL LOW (ref 12.0–15.0)
Lymphocytes Relative: 5 %
Lymphs Abs: 0.7 10*3/uL (ref 0.7–4.0)
MCH: 28.5 pg (ref 26.0–34.0)
MCHC: 30.5 g/dL (ref 30.0–36.0)
MCV: 93.2 fL (ref 80.0–100.0)
Monocytes Absolute: 0.1 10*3/uL (ref 0.1–1.0)
Monocytes Relative: 1 %
Neutro Abs: 12.2 10*3/uL — ABNORMAL HIGH (ref 1.7–7.7)
Neutrophils Relative %: 94 %
Platelets: 193 10*3/uL (ref 150–400)
RBC: 2.95 MIL/uL — ABNORMAL LOW (ref 3.87–5.11)
RDW: 22.7 % — ABNORMAL HIGH (ref 11.5–15.5)
WBC: 13 10*3/uL — ABNORMAL HIGH (ref 4.0–10.5)
nRBC: 0 % (ref 0.0–0.2)
nRBC: 0 /100 WBC

## 2018-09-15 LAB — POTASSIUM: Potassium: 5.1 mmol/L (ref 3.5–5.1)

## 2018-09-15 LAB — PHOSPHORUS: Phosphorus: 3.7 mg/dL (ref 2.5–4.6)

## 2018-09-15 LAB — MAGNESIUM: Magnesium: 2.2 mg/dL (ref 1.7–2.4)

## 2018-09-15 LAB — TRIGLYCERIDES: Triglycerides: 94 mg/dL (ref <150)

## 2018-09-15 LAB — PREALBUMIN: Prealbumin: 21.6 mg/dL (ref 18–38)

## 2018-09-15 NOTE — Progress Notes (Addendum)
Physical Therapy Treatment Patient Details Name: [name] MRN: [id number] DOB: 19-Feb-1941 Today's Date: 09/15/2018    History of Present Illness Pt is a 78 year old female with significant cardiac history that is post exploratory laparotomy for small bowel obstruction with associated ischemic bowel on 4/30, return to the intensive care on mechanical ventilator, critical care asked to assist with care.  VDRF 4/30-5/3.  Also PNA.  YTK:[id number] heart failure with EF 30%, nonsustained VT, pericardial effusion CAD w/ prior AICD, diastolic heart failure, CKD stage III, hypertension, rheumatoid arthritis, anemia of chronic disease.    PT Comments    Pt initially only agreeable to bed exercises. Once pt performing bed exercises, reluctantly agrees to sit EoB to do seated exercises. Pt requires minA for coming to [place] for returning to bed after sitting EoB for 7 minutes. Put RW in front of pt to practice sit to stand and she states "I'm not doing that." D/c plans remain appropriate at this time. PT will continue to follow acutely.    Follow Up Recommendations  SNF;Supervision/Assistance - 24 hour     Equipment Recommendations  None recommended by PT       Precautions / Restrictions Precautions Precautions: Fall Precaution Comments: Patient at first was putting her hands on her chest but when aksed idf sshe had chest pain she shook her head no.  Restrictions Weight Bearing Restrictions: No    Mobility  Bed Mobility Overal bed mobility: Needs Assistance Bed Mobility: Supine to Sit;Sit to Supine     Supine to sit: HOB elevated;Min assist Sit to supine: Mod assist   General bed mobility comments: min A for moving LE across bed and for bringing trunk to upright, modA to bring LE back into bed and scoot up in bed      Balance Overall balance assessment: Needs assistance Sitting-balance support: Feet supported Sitting balance-Leahy Scale: Fair Sitting balance - Comments:  able to sit unassisted                                    Cognition Arousal/Alertness: Awake/alert Behavior During Therapy: WFL for tasks assessed/performed Overall Cognitive Status: Difficult to assess Area of Impairment: Following commands;Safety/judgement;Problem solving                 Orientation Level: Place;Situation   Memory: Decreased short-term memory Following Commands: Follows one step commands inconsistently Safety/Judgement: Decreased awareness of safety;Decreased awareness of deficits   Problem Solving: Slow processing;Decreased initiation;Difficulty sequencing;Requires verbal cues;Requires tactile cues General Comments: answers questions sporatically,       Exercises General Exercises - Lower Extremity Ankle Circles/Pumps: AROM;Both;10 reps;Seated Long Arc Quad: 10 reps;AROM;Both Heel Slides: AAROM;5 reps;Both Hip ABduction/ADduction: 10 reps;Both;AROM Hip Flexion/Marching: AROM;Both;10 reps;Seated    General Comments        Pertinent Vitals/Pain Pain Assessment: Faces Faces Pain Scale: Hurts a little bit Pain Location: generalized with movement Pain Descriptors / Indicators: Discomfort;Grimacing Pain Intervention(s): Limited activity within patient's tolerance;Monitored during session;Repositioned           PT Goals (current goals can now be found in the care plan section) Acute Rehab PT Goals PT Goal Formulation: With patient Time For Goal Achievement: 09/08/18 Potential to Achieve Goals: Good Progress towards PT goals: Progressing toward goals    Frequency    Min 2X/week      PT Plan Current plan remains appropriate       AM-PAC PT "  6 Clicks" Mobility   Outcome Measure  Help needed turning from your back to your side while in a flat bed without using bedrails?: A Lot Help needed moving from lying on your back to sitting on the side of a flat bed without using bedrails?: A Lot Help needed moving to and from a bed  to a chair (including a wheelchair)?: A Lot Help needed standing up from a chair using your arms (e.g., wheelchair or bedside chair)?: A Lot Help needed to walk in hospital room?: Total Help needed climbing 3-5 steps with a railing? : Total 6 Click Score: 10    End of Session Equipment Utilized During Treatment: Gait belt Activity Tolerance: Patient limited by pain;Patient limited by fatigue;Patient limited by lethargy Patient left: in bed;with call bell/phone within reach;with bed alarm set;with SCD's reapplied Nurse Communication: Mobility status PT Visit Diagnosis: Unsteadiness on feet (R26.81);Muscle weakness (generalized) (M62.81)     Time: 1901-2224 PT Time Calculation (min) (ACUTE ONLY): 14 min  Charges:  $Therapeutic Exercise: 8-22 mins                     Jerusha Reising B. Migdalia Dk PT, DPT Acute Rehabilitation Services Pager [phone] Office [phone]    [place] 09/15/2018, 1:12 PM

## 2018-09-15 NOTE — Progress Notes (Signed)
Daily Progress Note   Patient Name: [name]       Date: 09/15/2018 DOB: [birth year]  Age: 78 y.o. MRN#: [id number] Attending Physician: Elodia Florence., * Primary Care Physician: [name], MD Admit Date: 08/13/2018  Reason for Consultation/Follow-up: Establishing goals of care  Subjective: Patient is awake, alert, however remains confused. She continues to state "maybe later, I am tired" when asked questions such as name or date. She does deny pain when asked. Does follow some commands. Continues to have poor po intake.   I spoke with her husband, [name] at the bedside. He explained he remained hopeful that she would began showing more signs of improvement and he is worried about her. Updates provided. He expressed he was hopeful that she would one day return home.   We discussed GOC. Mr. Sadiq expressed he wished to continue with current treatments with hopes that she showed some signs of improvement. He continues to want to take it "day by day", with acknowledgment that he does not want her to suffer.   He requested to speak with her via phone. She continued to push the phone away and state "I don't want to speak, take a message, tell them I am tired." I attempted to orient patient that it was her husband on the phone however, she did not appear to understand. Husband tearful in stating "she doesn't know who I am" Explained to him that she is having some confusion. I asked patient if she was married and she replied "yes" I asked her if she knew her husband's name and she replied "I don't want to talk, maybe later". Husband aware and verbalized understanding and appreciation. Placed phone up to patient's ear to allow him to express he loved her. She listened with no response.   Length of Stay:  33  Current Medications: Scheduled Meds:  . amiodarone  200 mg Oral Daily  . carvedilol  3.125 mg Oral BID WC  . chlorhexidine  15 mL Mouth Rinse BID  . heparin injection (subcutaneous)  5,000 Units Subcutaneous Q8H  . insulin aspart  0-20 Units Subcutaneous Q4H  . mouth rinse  15 mL Mouth Rinse q12n4p  . mupirocin ointment   Nasal BID  . pantoprazole  40 mg Oral Daily  . predniSONE  40 mg Oral Q breakfast  . saccharomyces boulardii  250 mg Oral BID    Continuous Infusions: . lactated ringers 75 mL/hr at 09/15/18 0700  . TPN ADULT (ION) 30 mL/hr at 09/15/18 0700    PRN Meds: acetaminophen **OR** acetaminophen, albuterol, ondansetron **OR** ondansetron (ZOFRAN) IV, oxyCODONE, sodium chloride flush     Vital Signs: BP 123/73 (BP Location: Right Arm)   Pulse 73   Temp 98 F (36.7 C) (Oral)   Resp 17   Ht 5\' 4"  (1.626 m)   Wt 61.7 kg   SpO2 100%   BMI 23.35 kg/m  SpO2: SpO2: 100 % O2 Device: O2 Device: Room Air O2 Flow Rate: O2 Flow Rate (L/min): 0 L/min  Intake/output summary:   Intake/Output Summary (Last 24 hours) at 09/15/2018 1450 Last data filed at 09/15/2018 0948 Gross per 24 hour  Intake 1905.17 ml  Output 500  ml  Net 1405.17 ml   LBM: Last BM Date: 09/13/18 Baseline Weight: Weight: 66.7 kg Most recent weight: Weight: 61.7 kg       Palliative Assessment/Data:PPS 20%    Flowsheet Rows     Most Recent Value  Intake Tab  Referral Department  Surgery  Unit at Time of Referral  Intermediate Care Unit  Palliative Care Primary Diagnosis  Sepsis/Infectious Disease  Date Notified  09/10/18  Palliative Care Type  New Palliative care  Reason for referral  Clarify Goals of Care  Date of Admission  08/13/18  Date first seen by Palliative Care  09/11/18  # of days Palliative referral response time  1 Day(s)  # of days IP prior to Palliative referral  28  Clinical Assessment  Palliative Performance Scale Score  20%  Psychosocial & Spiritual Assessment   Palliative Care Outcomes  Patient/Family meeting held?  Yes  Who was at the meeting?  spouse  Palliative Care Outcomes  Clarified goals of care, Provided advance care planning, Provided psychosocial or spiritual support      Patient Active Problem List   Diagnosis Date Noted  . Goals of care, counseling/discussion   . Palliative care by specialist   . Acute respiratory failure with hypoxia ([place])   . Protein-calorie malnutrition, severe 08/25/2018  . Wide-complex tachycardia ([place])   . Acute renal failure superimposed on stage 3 chronic kidney disease ([place]) 08/18/2018  . SBO (small bowel obstruction) ([place]) 08/14/2018  . Pressure injury of skin 08/14/2018  . CAP (community acquired pneumonia) 08/13/2018  . AKI (acute kidney injury) ([place]) 08/10/2017  . Hyperkalemia 08/10/2017  . Acute blood loss anemia 09/20/2016  . Ischemic cardiomyopathy 09/20/2016  . Chronic combined systolic and diastolic CHF (congestive heart failure) ([place]) 09/20/2016  . CKD (chronic kidney disease), stage IV ([place]) 09/20/2016  . History of breast cancer 08/02/2016  . Primary osteoarthritis of both knees 08/02/2016  . Primary osteoarthritis of both feet 08/02/2016  . History of anemia 07/17/2016  . History of CHF (congestive heart failure) 06/04/2016  . History of chronic kidney disease 06/04/2016  . Symptomatic anemia 06/04/2016  . Elevated sedimentation rate 03/03/2016  . Idiopathic chronic gout of multiple sites without tophus 03/03/2016  . Total knee replacement status, right 03/03/2016  . High risk medication use 03/03/2016  . Chronic kidney disease (CKD) stage G3b/A1, moderately decreased glomerular filtration rate (GFR) between 30-44 mL/min/1.73 square meter and albuminuria creatinine ratio less than 30 mg/g (HCC) 02/01/2016  . CAD S/P remote PCI- no details 09/02/2014  . Cardiomyopathy, ischemic-EF 30-35% March 2015 09/02/2014  . Diastolic dysfunction-grade 2 09/02/2014  . CHF exacerbation ([place])  08/28/2014  . Acute on chronic combined systolic and diastolic congestive heart failure ([place]) 08/28/2014  . Iron deficiency anemia 08/20/2013  . Malnutrition of moderate degree ([place]) 07/21/2013  . PNA (pneumonia) 07/19/2013  . HCAP (healthcare-associated pneumonia) 07/17/2013  . Sepsis ([place]) 07/17/2013  . ARF (acute renal failure) ([place]) 07/17/2013  . Rheumatoid arthritis ([place]) 07/04/2013  . Hypokalemia 07/03/2013  . Chest pain 07/03/2013  . Biventricular ICD in place (MDT 2014) 11/06/2012  . Anemia of chronic disease   . Breast cancer ([place])   . Hypertension   . Dyslipidemia 05/24/2009  . Chronic systolic heart failure ([place]) 05/24/2009  . Primary osteoarthritis of both hands 08/11/2007    Palliative Care Assessment & Plan   HPI: 78 y.o. female  with past medical history of anemia, ASCVD, AICD placement, breast cancer, CKD 3, systolic heart failure, HLD,  HTN, GERD, gout, RA, and impaired hearing admitted on 08/13/2018 with abdominal pain and decreased appetite.  Diagnosed with SBO and failed conservative management. Patient underwent surgical interventions with ex lap, lysis of adhesions, and bowel resection for ischemic bowel. She required ventilator and pressor support in ICU following surgery - these were discontinued 5/3. Since extubation she has had some issues with respiratory distress and prolonged postop ileus - she has required ongoing TNA. Surgical wound has dehisced. Per PT/OT eval - patient very weak and lethargic during sessions. Minimal PO intake. PMT consulted by surgery for GOC d/t poor PO intake, wound dehiscence, and guarded prognosis.   Recommendations/Plan:  Continue to treat  Husband remains hopeful  PMT will continue to support and follow as needed   Code Status:  DNR  Prognosis:   Guarded to Poor   Discharge Planning:  To Be Determined  Thank you for allowing the Palliative Medicine Team to assist in the care of this patient.   Total Time 35 min.   Prolonged Time Billed NO    The above conversation was completed via telephone due to the visitor restrictions during the COVID-19 pandemic. Thorough chart review and discussion with necessary members of the care team was completed as part of assessment. All issues were discussed and addressed but no physical exam was performed.     Greater than 50%  of this time was spent counseling and coordinating care related to the above assessment and plan.  Alda Lea, AGPCNP-BC Palliative Medicine Team  Phone: [phone] Pager: [phone] Amion: Bjorn Pippin

## 2018-09-15 NOTE — Plan of Care (Signed)
  Problem: Clinical Measurements: Goal: Will remain free from infection Outcome: Progressing Goal: Diagnostic test results will improve Outcome: Progressing   Problem: Nutrition: Goal: Adequate nutrition will be maintained Outcome: Progressing   Problem: Skin Integrity: Goal: Risk for impaired skin integrity will decrease Outcome: Progressing

## 2018-09-15 NOTE — Progress Notes (Signed)
Central line dcd as ordered using sterile technique.No hematoma. No drainage. Cath intact upon removal.Pressure dressing applied. No bleeding noted.Patient tolerated procedure well.

## 2018-09-15 NOTE — Progress Notes (Addendum)
PROGRESS NOTE    [name]  VEH:[id number] DOB: 1940/11/27 DOA: 08/13/2018 PCP: [name], MD   Brief Narrative:  HPI on 08/13/2018 by Dr. Bernadene Person [name] 78 y.o.femalewith medical history significant ofanemia chronic disease, ASCVD, AICD placement, history of breast cancer, chronic bronchitis, stage III chronic renal disease, systolic heart failure, hyperlipidemia, GERD, history of GI bleed, gout, hypertension, rheumatoid arthritis, impaired hearing, history of UTI who is coming to the emergency department with complaints of abdominal pain apparently for the past 3 days associated with decreased appetite, several episodes of diarrhea, nausea, but no emesis. She denies fever, cough, sore throat, chest pain, dysuria, frequency, hematuria, melena or hematochezia. She denies polyuria, polydipsia, polyphagia or blurred vision. History is limited by the patient severe hearing impairment.  Interim history Patient was admitted with small bowel obstruction, failed conservative management and was transferred to [name] General surgery consulted and appreciated, patient underwent ex lap with lysis of adhesions, bowel resection for ischemic bowel. She continued on the ventilator was put in ICU as she was also hypotensive and started on pressors. Patient was also shocked for VT. She developed progressive renal failure, oliguria. She was weaned off of pressors and extubated. And TRH assumed care. Started on antibiotics for possible pneumonia. Patient now with prolonged postop ileus, ongoing TNA, diet gradually being advanced per speech therapy. She was also noted to have [name] self-limiting GI bleed. Patient had acute respiratory distress on 09/01/2018 with decompensated CHF and upper airway wheezing. PCCM consulted.  General surgery following.  Assessment & Plan:   Principal Problem:   CAP (community acquired pneumonia) Active Problems:  Dyslipidemia   Anemia of chronic disease   CAD S/P remote PCI- no details   Chronic combined systolic and diastolic CHF (congestive heart failure) (HCC)   AKI (acute kidney injury) (HCC)   SBO (small bowel obstruction) (HCC)   Pressure injury of skin   Acute renal failure superimposed on stage 3 chronic kidney disease (HCC)   Wide-complex tachycardia (HCC)   Protein-calorie malnutrition, severe   Acute respiratory failure with hypoxia (HCC)   Goals of care, counseling/discussion   Palliative care by specialist   Acute hypoxic respiratory failure likely multifactorial secondary to decompensated CHF and [name] component of bronchospasm Initially admitted for community-acquired pneumonia, completed her course of antibiotics PCCM consulted was briefly on BiPAP Responded well to IV Lasix and Solu-Medrol Continue bronchodilators and BiPAP as needed Weaning off Solu-Medrol, switch to prednisone 40 mg daily - will need taper Vital signs reviewed and are stable.  Small bowel obstruction   ischemic colon Status post ex lap, appendectomy   Post op ileus   Dehiscence of Wound General surgery following Patient failed conservative [facility] Status post exploratory laparotomy for SBO and small bowel resection of ischemic necrotic bowel Completed 10 days of IV Zosyn Wound VAC removed on 08/27/2018 Wound dressing as recommended by general surgery - No plans for any additional surgery.  Wound has dehisced.   Will stop TPN today.  Continue to encourage PO.   Plan for cortrak tomorrow.   Hypernatremia Sodium as high as 156 on 09/10/2018 Improved, now stable at 145 Continue LR  Resolving AKI on CKD 3 Creatinine at baseline 1.5 with GFR of 38 Creatinine peaked at 3.21 Improved today to 1.49 Continue IVF Follow UOP Continue to avoid nephrotoxic agents CTM  Resolved septic shock Post VDRF and pressure support Extubated on 08/24/2018 Weaned off  pressors  on 08/23/2018  Leukocytosis likely reactive from steroid use versus recent surgery DC IV Solu-Medrol 40 mg daily and start prednisone 40 mg daily on 09/14/2018 WBC stable No sign of active infective process Afebrile  Elevated LFTs Alkaline phosphatase, AST and ALT elevated Levels are trending down  Physical debility/ambulatory dysfunction PT OT recommended SNF Fall precautions  Central Line: longstanding central line, will remove today  Goals of care: Palliative care following, now DNR.    DVT prophylaxis: heparin Code Status: DNR Family Communication: none at bedside - discussed with husband Disposition Plan: pending   Consultants:   Palliative care  Surgery  Cardiology  Procedures:  Cardioversion 08/20/2018  Ex laparotomy with lysis of adhesion, bowel resection for ischemic bowel  Echo IMPRESSIONS   1. The left ventricle has Sumner Boesch visually estimated ejection fraction of approximately 30%. There is inferior basal and inferoseptal akinesis. The cavity size and wall thickness are normal. Diastolic function is indeterminate. 2. The right ventricle has normal systolic function. The cavity was normal. There is no increase in right ventricular wall thickness. Device wire noted in right ventricle. RV-RA gradient 25.9 mmHg, unable to estimate RVSP. 3. Left atrial size was moderately dilated. 4. The aortic valve is tricuspid. Aortic valve regurgitation is trivial by color flow Doppler. Mild aortic annular calcification noted. 5. The mitral valve is grossly normal. There is mild mitral annular calcification present. 6. The aortic root is normal in size and structure. 7. The tricuspid valve is grossly normal. 8. The pericardial effusion is circumferential. 9. Small to moderate pericardial effusion with associated adipose tissue/organization.  Antimicrobials: Anti-infectives (From admission, onward)   Start     Dose/Rate Route Frequency Ordered Stop   08/23/18 1400   piperacillin-tazobactam (ZOSYN) IVPB 2.25 g     2.25 g 100 mL/hr over 30 Minutes Intravenous Every 8 hours 08/23/18 0928 08/30/18 2230   08/21/18 1200  piperacillin-tazobactam (ZOSYN) IVPB 3.375 g  Status:  Discontinued     3.375 g 12.5 mL/hr over 240 Minutes Intravenous Every 8 hours 08/21/18 1102 08/23/18 0928   08/14/18 1000  hydroxychloroquine (PLAQUENIL) tablet 200 mg  Status:  Discontinued    Note to Pharmacy:  Take 1 tablet by mouth daily Monday through Friday only     200 mg Oral Once per day on Mon Tue Wed Thu Fri 08/14/18 0742 08/21/18 1147   08/13/18 2200  doxycycline (VIBRA-TABS) tablet 100 mg  Status:  Discontinued     100 mg Oral Every 12 hours 08/13/18 2134 08/13/18 2135   08/13/18 2200  doxycycline (VIBRAMYCIN) 100 mg in sodium chloride 0.9 % 250 mL IVPB  Status:  Discontinued     100 mg 125 mL/hr over 120 Minutes Intravenous Every 12 hours 08/13/18 2135 08/19/18 1702   08/13/18 2145  cefTRIAXone (ROCEPHIN) 1 g in sodium chloride 0.9 % 100 mL IVPB  Status:  Discontinued     1 g 200 mL/hr over 30 Minutes Intravenous Every 24 hours 08/13/18 2134 08/14/18 0743     Subjective: No complaints today  Objective: Vitals:   09/15/18 0010 09/15/18 0401 09/15/18 0807 09/15/18 1232  BP: (!) 130/53 (!) 152/61 (!) 145/59 123/73  Pulse: 70 70 70 73  Resp: 16 13 16 17   Temp: 98 F (36.7 C) 98.1 F (36.7 C) 97.6 F (36.4 C) 98 F (36.7 C)  TempSrc: Oral Oral Oral Oral  SpO2: 100% 100%  100%  Weight:      Height:  Intake/Output Summary (Last 24 hours) at 09/15/2018 1543 Last data filed at 09/15/2018 1500 Gross per 24 hour  Intake 2690.44 ml  Output 500 ml  Net 2190.44 ml   Filed Weights   09/05/18 0325 09/09/18 0645 09/12/18 0251  Weight: 68.8 kg 62.6 kg 61.7 kg    Examination:  General exam: Appears calm and comfortable  Respiratory system: Clear to auscultation. Respiratory effort normal. Cardiovascular system: S1 & S2 heard, RRR. Gastrointestinal system:  Abdomen is nondistended, soft and nontender.  Abdominal dressing c/d/i.   Central nervous system: Alert and oriented. No focal neurological deficits. Extremities: no LEE Skin: No rashes, lesions or ulcers Psychiatry: Judgement and insight appear normal. Mood & affect appropriate.     Data Reviewed: I have personally reviewed following labs and imaging studies  CBC: Recent Labs  Lab 09/11/18 0500 09/12/18 0236 09/13/18 0950 09/14/18 0906 09/15/18 0459  WBC 15.0* 14.1* 12.3* 12.1* 13.0*  NEUTROABS  --   --   --  9.6* 12.2*  HGB 8.4* 8.0* 7.8* 8.2* 8.4*  HCT 27.9* 26.9* 26.5* 27.7* 27.5*  MCV 93.9 91.8 92.7 93.6 93.2  PLT 194 194 181 190 102   Basic Metabolic Panel: Recent Labs  Lab 09/10/18 0545  09/11/18 0500 09/11/18 1700 09/12/18 0236 09/13/18 0950 09/14/18 0906 09/15/18 0459 09/15/18 1436  NA 156*   < > 155* 148* 145 142 145 145  --   K 4.2   < > 4.2 4.5 4.4 3.7 3.9 5.5* 5.1  CL 111   < > 112* 110 108 108 112* 115*  --   CO2 30   < > 29 27 27 25 23  17*  --   GLUCOSE 112*   < > 212* 399* 94 93 129* 90  --   BUN 154*   < > 139* 121* 110* 84* 85* 88*  --   CREATININE 1.96*   < > 1.91* 1.83* 1.71* 1.50* 1.62* 1.49*  --   CALCIUM 8.7*   < > 8.8* 8.2* 8.4* 8.3* 8.5* 8.5*  --   MG 2.4  --  2.4  --  2.2  --   --  2.2  --   PHOS 5.0*  --  5.3*  --   --   --   --  3.7  --    < > = values in this interval not displayed.   GFR: Estimated Creatinine Clearance: 26.4 mL/min (Ophia Shamoon) (by C-G formula based on SCr of 1.49 mg/dL (H)). Liver Function Tests: Recent Labs  Lab 09/11/18 0500 09/12/18 0236 09/15/18 0459  AST 41 42* 60*  ALT 56* 56* 75*  ALKPHOS 155* 151* 150*  BILITOT 4.1* 4.2* 4.6*  PROT 5.7* 5.4* 5.5*  ALBUMIN 1.8* 1.7* 1.7*   No results for input(s): LIPASE, AMYLASE in the last 168 hours. No results for input(s): AMMONIA in the last 168 hours. Coagulation Profile: No results for input(s): INR, PROTIME in the last 168 hours. Cardiac Enzymes: No results for  input(s): CKTOTAL, CKMB, CKMBINDEX, TROPONINI in the last 168 hours. BNP (last 3 results) No results for input(s): PROBNP in the last 8760 hours. HbA1C: No results for input(s): HGBA1C in the last 72 hours. CBG: Recent Labs  Lab 09/14/18 2043 09/15/18 0006 09/15/18 0357 09/15/18 0805 09/15/18 1231  GLUCAP 326* 167* 74 144* 139*   Lipid Profile: Recent Labs    09/15/18 0459  TRIG 94   Thyroid Function Tests: No results for input(s): TSH, T4TOTAL, FREET4, T3FREE, THYROIDAB in the last 72  hours. Anemia Panel: No results for input(s): VITAMINB12, FOLATE, FERRITIN, TIBC, IRON, RETICCTPCT in the last 72 hours. Sepsis Labs: No results for input(s): PROCALCITON, LATICACIDVEN in the last 168 hours.  Recent Results (from the past 240 hour(s))  Culture, blood (routine x 2)     Status: None   Collection Time: 09/09/18  7:22 AM  Result Value Ref Range Status   Specimen Description BLOOD RIGHT ANTECUBITAL  Final   Special Requests   Final    BOTTLES DRAWN AEROBIC ONLY Blood Culture results may not be optimal due to an inadequate volume of blood received in culture bottles   Culture   Final    NO GROWTH 5 DAYS Performed at [facility], [place] [address] [place], [place] [address]    Report Status 09/14/2018 FINAL  Final  Culture, blood (routine x 2)     Status: None   Collection Time: 09/09/18  7:28 AM  Result Value Ref Range Status   Specimen Description BLOOD RIGHT HAND  Final   Special Requests   Final    BOTTLES DRAWN AEROBIC ONLY Blood Culture adequate volume   Culture   Final    NO GROWTH 5 DAYS Performed at [facility], [place] [address] [place], [place] [address]    Report Status 09/14/2018 FINAL  Final  Culture, Urine     Status: Abnormal   Collection Time: 09/09/18 10:30 AM  Result Value Ref Range Status   Specimen Description URINE, CLEAN CATCH  Final   Special Requests   Final    NONE Performed at [facility], [place] [address] [place], [place]  [address]    Culture (Sumaiyah Markert)  Final    30,000 COLONIES/mL ESCHERICHIA COLI Confirmed Extended Spectrum Beta-Lactamase Producer (ESBL).  In bloodstream infections from ESBL organisms, carbapenems are preferred over piperacillin/tazobactam. They are shown to have Oliva Montecalvo lower risk of mortality.    Report Status 09/12/2018 FINAL  Final   Organism ID, Bacteria ESCHERICHIA COLI (Mckenzee Beem)  Final      Susceptibility   Escherichia coli - MIC*    AMPICILLIN >=32 RESISTANT Resistant     CEFAZOLIN >=64 RESISTANT Resistant     CEFTRIAXONE >=64 RESISTANT Resistant     CIPROFLOXACIN >=4 RESISTANT Resistant     GENTAMICIN >=16 RESISTANT Resistant     IMIPENEM <=0.25 SENSITIVE Sensitive     NITROFURANTOIN <=16 SENSITIVE Sensitive     TRIMETH/SULFA <=20 SENSITIVE Sensitive     AMPICILLIN/SULBACTAM >=32 RESISTANT Resistant     PIP/TAZO 64 INTERMEDIATE Intermediate     Extended ESBL POSITIVE Resistant     * 30,000 COLONIES/mL ESCHERICHIA COLI         Radiology Studies: [place] Ekg Site Rite  Result Date: 09/15/2018 If Site Rite image not attached, placement could not be confirmed due to current cardiac rhythm.       Scheduled Meds:  amiodarone  200 mg Oral Daily   carvedilol  3.125 mg Oral BID WC   chlorhexidine  15 mL Mouth Rinse BID   heparin injection (subcutaneous)  5,000 Units Subcutaneous Q8H   insulin aspart  0-20 Units Subcutaneous Q4H   mouth rinse  15 mL Mouth Rinse q12n4p   mupirocin ointment   Nasal BID   pantoprazole  40 mg Oral Daily   predniSONE  40 mg Oral Q breakfast   saccharomyces boulardii  250 mg Oral BID   Continuous Infusions:  lactated ringers 75 mL/hr at 09/15/18 1500   TPN ADULT (  ION) Stopped (09/15/18 1311)     LOS: 33 days    Time spent: over 55 min    Fayrene Helper, MD [facility] Pager AMION  If 7PM-7AM, please contact night-coverage [email or web address] Password [facility] 09/15/2018, 3:43 PM

## 2018-09-15 NOTE — Progress Notes (Signed)
Nutrition Follow-up  DOCUMENTATION CODES:   Severe malnutrition in context of acute illness/injury  INTERVENTION:   Once Cortrak placed tomorrow:  -Osmolite 1.5 @ 20 ml/hr  -Increase by 10 ml Q8 hours to goal rate of 55 ml/hr (1320 ml) -30 ml Prostat BID At goal TF provides: 2180 kcal, 113 grams protein, and 1006 ml free water. Meets 100% of needs.   -D/C Ensure Enlive  -Continue Magic Cup TID with meals, each supplement provides 290 kcals and 9 grams protein -Continue TPN- wean per pharmacy once enteral toleration is established -Continue feeding assistance with meals  NUTRITION DIAGNOSIS:   Severe Malnutrition related to acute illness(SBO) as evidenced by energy intake < or equal to 50% for > or equal to 5 days, mild fat depletion, moderate muscle depletion.  Ongoing  GOAL:   Patient will meet greater than or equal to 90% of their needs  Progressing with PO- still not meetiing  MONITOR:   Diet advancement, Weight trends, Labs, Vent status, TF tolerance, Skin, I & O's  REASON FOR ASSESSMENT:   Consult Calorie Count  ASSESSMENT:   Patient with PMH significant for anemia, CAD s/p AICD placement, breast cancer, CKD III, CHF, HLD, impaired hearing, and RA. Presented to [facility] on 4/23 with CAP and SBO.    4/28- CT scan showed persistent SBO, tx to [facility] for surgery 4/30- ex lap, lysis of adhesions, small bowel resection due to ischemic changes 5/3- extubated 5/4- TPN started  5/5- NGT removed  5/6- diet advanced to full liquid 5/9- diet advanced DYS 1, thin liquids  RD working remotely.  Spoke with RN via phone. Pt's appetite remains poor. Meal completions charted as 10-25% for her last eight meals. She is taking [employer] inconsistently. Refuses Ensure. Discussed placement of Cortrak tube to hopefully wean pt off TPN. Plan for placement tomorrow.   Pt tolerating TPN @ 30 ml/hr to provide 938 kcal and 50 g protein. Wean off once enteral toleration is established.    Weight noted to decrease from 62.6 kg on 5/19 to 61.7 kg on 5/22. Recommend checking daily weights to monitor trends.   I/O: -11,404 since admit UOP: 875 ml x 24 hrs + 1 occurrence   Drips: LR @ 75 ml/hr  Medications: amiodarone, SS novolog, prednisone Labs: K 5.5 (H) CBG 74-326 elevated LFTs   Diet Order:   Diet Order            DIET - DYS 1 Room service appropriate? No; Fluid consistency: Thin  Diet effective now              EDUCATION NEEDS:   Not appropriate for education at this time  Skin:  Skin Assessment: Skin Integrity Issues: Skin Integrity Issues:: Other (Comment), Stage II, Incisions Stage II: buttocks, coccyx Incisions: dehisced abdomen, closed rt arm Other: MASD medial bilateral buttocks; open wound to rt lateral arm  Last BM:  5/23  Height:   Ht Readings from Last 1 Encounters:  08/21/18 5\' 4"  (1.626 m)    Weight:   Wt Readings from Last 1 Encounters:  09/12/18 61.7 kg    Ideal Body Weight:  54.5 kg  BMI:  Body mass index is 23.35 kg/m.  Estimated Nutritional Needs:   Kcal:  2000-2200 kcal  Protein:  100-120 grams  Fluid:  >/= 2 L/day   Mariana Single RD, LDN Clinical Nutrition Pager # - [phone]

## 2018-09-16 LAB — CBC
HCT: 26.5 % — ABNORMAL LOW (ref 36.0–46.0)
Hemoglobin: 7.9 g/dL — ABNORMAL LOW (ref 12.0–15.0)
MCH: 27.5 pg (ref 26.0–34.0)
MCHC: 29.8 g/dL — ABNORMAL LOW (ref 30.0–36.0)
MCV: 92.3 fL (ref 80.0–100.0)
Platelets: 189 10*3/uL (ref 150–400)
RBC: 2.87 MIL/uL — ABNORMAL LOW (ref 3.87–5.11)
RDW: 22.7 % — ABNORMAL HIGH (ref 11.5–15.5)
WBC: 12.2 10*3/uL — ABNORMAL HIGH (ref 4.0–10.5)
nRBC: 0 % (ref 0.0–0.2)

## 2018-09-16 LAB — COMPREHENSIVE METABOLIC PANEL
ALT: 78 U/L — ABNORMAL HIGH (ref 0–44)
AST: 63 U/L — ABNORMAL HIGH (ref 15–41)
Albumin: 1.7 g/dL — ABNORMAL LOW (ref 3.5–5.0)
Alkaline Phosphatase: 159 U/L — ABNORMAL HIGH (ref 38–126)
Anion gap: 11 (ref 5–15)
BUN: 69 mg/dL — ABNORMAL HIGH (ref 8–23)
CO2: 20 mmol/L — ABNORMAL LOW (ref 22–32)
Calcium: 8.5 mg/dL — ABNORMAL LOW (ref 8.9–10.3)
Chloride: 114 mmol/L — ABNORMAL HIGH (ref 98–111)
Creatinine, Ser: 1.41 mg/dL — ABNORMAL HIGH (ref 0.44–1.00)
GFR calc Af Amer: 41 mL/min — ABNORMAL LOW (ref >60)
GFR calc non Af Amer: 35 mL/min — ABNORMAL LOW (ref >60)
Glucose, Bld: 124 mg/dL — ABNORMAL HIGH (ref 70–99)
Potassium: 5 mmol/L (ref 3.5–5.1)
Sodium: 145 mmol/L (ref 135–145)
Total Bilirubin: 4.2 mg/dL — ABNORMAL HIGH (ref 0.3–1.2)
Total Protein: 5.1 g/dL — ABNORMAL LOW (ref 6.5–8.1)

## 2018-09-16 LAB — GLUCOSE, CAPILLARY
Glucose-Capillary: 101 mg/dL — ABNORMAL HIGH (ref 70–99)
Glucose-Capillary: 174 mg/dL — ABNORMAL HIGH (ref 70–99)
Glucose-Capillary: 213 mg/dL — ABNORMAL HIGH (ref 70–99)
Glucose-Capillary: 42 mg/dL — CL (ref 70–99)
Glucose-Capillary: 49 mg/dL — ABNORMAL LOW (ref 70–99)
Glucose-Capillary: 56 mg/dL — ABNORMAL LOW (ref 70–99)
Glucose-Capillary: 82 mg/dL (ref 70–99)
Glucose-Capillary: 88 mg/dL (ref 70–99)
Glucose-Capillary: 94 mg/dL (ref 70–99)

## 2018-09-16 LAB — MAGNESIUM: Magnesium: 1.9 mg/dL (ref 1.7–2.4)

## 2018-09-16 MED ORDER — PREDNISONE 20 MG PO TABS
40.0000 mg | ORAL_TABLET | Freq: Every day | ORAL | Status: DC
  Administered 2018-09-17 – 2018-09-19 (×3): 40 mg via ORAL
  Filled 2018-09-16 (×3): qty 2

## 2018-09-16 MED ORDER — DEXTROSE 5 % IV SOLN
INTRAVENOUS | Status: DC
  Administered 2018-09-16 (×2): via INTRAVENOUS

## 2018-09-16 MED ORDER — OSMOLITE 1.5 CAL PO LIQD
1000.0000 mL | ORAL | Status: DC
  Administered 2018-09-16 – 2018-09-19 (×4): 1000 mL
  Filled 2018-09-16 (×7): qty 1000

## 2018-09-16 MED ORDER — PREDNISONE 20 MG PO TABS: 30.0000 mg | ORAL_TABLET | Freq: Every day | ORAL | Status: DC

## 2018-09-16 MED ORDER — PRO-STAT SUGAR FREE PO LIQD
30.0000 mL | Freq: Two times a day (BID) | ORAL | Status: DC
  Administered 2018-09-16 – 2018-09-19 (×7): 30 mL
  Filled 2018-09-16 (×7): qty 30

## 2018-09-16 NOTE — Plan of Care (Signed)
Care Plan has been reviewed:   Problem: Nutrition: Pt has low to almost no appetite. She refused to eat, only took a few bite on her tray. TPN was stopped today and IJ was removed. Goal: Adequate nutrition will be maintained Outcome: Not Progressing: need reinforcement and encouragement for oral intake. MD plan Cortex placement tomorrow ( 09/16/2018)   Problem: Clinical Measurements: a few episodes of hypoglycemia day shift and tonight. Pt has become very sensitive to sliding scale of her Insulin after stopped TPN. MD made aware.  Please look on CBG result. Goal: Ability to maintain clinical measurements within normal limits will improve Outcome: Progressing: got new order to change her IV fluid from Lactate Ringer to 5% Dextrose at 75 ml/hr. Continue to check her CBG q 4 hrs.  Problem: Respiratory and cardiovascular Goal: Ability to maintain adequate ventilation and perfusion will improve Outcome: Progressing: A-V paced on monitor, hemodynamic remained stable. No distress had seen. Continue to monitor.  Problem: Skin Integrity: abdominal wound dehiscence,  right arm old wound from opened blister from extravasation, Goal: Risk for impaired skin integrity will decrease Outcome: Progressing: Abdominal dressing changed BID with wet to dry dressing, had purulent drainage with malodorous.  Right arm, the wound looked clean and dry with eschar, no drainage, dressing changed tonight. Continue to monitor.  Problem: Clinical Measurements: Contact isolation, Urine culture found ESBL, E coli, MRSA form nares. Goal: Will remain free from infection Outcome: Not Progressing, however Pt remained afebrile.   Problem: Activity: weak and limited movement. Goal: Risk for activity intolerance will decrease Outcome: Not progressing; followed up by PT, position changed q 2 hrs, low air loss matress with pillows supported. Sacral foam in placed. No pressure sore had seen.      Kennyth Lose, BSN, RN,  [place], [place]

## 2018-09-16 NOTE — Progress Notes (Signed)
PROGRESS NOTE    [name]  IRS:[id number] DOB: 1940/06/05 DOA: 08/13/2018 PCP: [name], MD   Brief Narrative:  HPI on 08/13/2018 by Dr. Bernadene Person [name] 78 y.o.femalewith medical history significant ofanemia chronic disease, ASCVD, AICD placement, history of breast cancer, chronic bronchitis, stage III chronic renal disease, systolic heart failure, hyperlipidemia, GERD, history of GI bleed, gout, hypertension, rheumatoid arthritis, impaired hearing, history of UTI who is coming to the emergency department with complaints of abdominal pain apparently for the past 3 days associated with decreased appetite, several episodes of diarrhea, nausea, but no emesis. She denies fever, cough, sore throat, chest pain, dysuria, frequency, hematuria, melena or hematochezia. She denies polyuria, polydipsia, polyphagia or blurred vision. History is limited by the patient severe hearing impairment.  Interim history Patient was admitted with small bowel obstruction, failed conservative management and was [name]. General surgery consulted and appreciated, patient underwent ex lap with lysis of adhesions, bowel resection for ischemic bowel. She continued on the ventilator was put in ICU as she was also hypotensive and started on pressors. Patient was also shocked for VT. She developed progressive renal failure, oliguria. She was weaned off of pressors and extubated. And TRH assumed care. Started on antibiotics for possible pneumonia. Patient now with prolonged postop ileus, ongoing TNA, diet gradually being advanced per speech therapy. She was also noted to have Liliahna Cudd self-limiting GI bleed. Patient had acute respiratory distress on 09/01/2018 with decompensated CHF and upper airway wheezing. PCCM consulted.  General surgery following.  She's had Theresia Pree long hospitalization after ex lap for small bowel obstruction with ischemic and necrotic bowel.   She had ex lap with small bowel resection, lysis of adhessions, and appendectomy by Dr. Marlou Starks.  Hospitalization has been c/b aki, acute hypoxic respiratory failure and wound dehiscence.  Was receiving TPN via central line and now has contrak for nutrition.  Assessment & Plan:   Principal Problem:   CAP (community acquired pneumonia) Active Problems:   Dyslipidemia   Anemia of chronic disease   CAD S/P remote PCI- no details   Chronic combined systolic and diastolic CHF (congestive heart failure) (HCC)   AKI (acute kidney injury) (HCC)   SBO (small bowel obstruction) (HCC)   Pressure injury of skin   Acute renal failure superimposed on stage 3 chronic kidney disease (HCC)   Wide-complex tachycardia (HCC)   Protein-calorie malnutrition, severe   Acute respiratory failure with hypoxia (HCC)   Goals of care, counseling/discussion   Palliative care by specialist   Acute hypoxic respiratory failure likely multifactorial secondary to decompensated CHF and Jaythen Hamme component of bronchospasm Initially admitted for community-acquired pneumonia, completed her course of antibiotics PCCM consulted was briefly on BiPAP Responded well to IV Lasix and Solu-Medrol Continue bronchodilators and BiPAP as needed Weaning off Solu-Medrol, switch to prednisone 40 mg daily - will need taper  Vital signs reviewed and are stable.  Small bowel obstruction  ischemic colon Status post ex lap, appendectomy  Post op ileus  Dehiscence of Wound General surgery following Patient failed conservative [facility] Status post exploratory laparotomy for SBO and small bowel resection of ischemic necrotic bowel Completed 10 days of IV Zosyn Wound VAC removed on 08/27/2018 Wound dressing as recommended by general surgery - No plans for any additional surgery.  Wound has dehisced.   TPN discontinued.  Continue to encourage PO.   Cortrak placed 5/26, start tube feeds. Plan for  cortrak  tomorrow.   Hypernatremia Sodium as high as 156 on 09/10/2018 Improved, now stable at 145  Resolving AKI on CKD 3 Creatinine at baseline 1.5 with GFR of 38 Creatinine peaked at 3.21 Improved today Continue IVF Follow UOP Continue to avoid nephrotoxic agents CTM  Resolved septic shock Post VDRF and pressure support Extubated on 08/24/2018 Weaned off pressors on 08/23/2018  Leukocytosis likely reactive from steroid use versus recent surgery DC IV Solu-Medrol 40 mg daily and start prednisone 40 mg daily on 09/14/2018 - will need taper WBC stable No sign of active infective process Afebrile  Elevated LFTs Alkaline phosphatase, AST and ALT elevated Levels are trending down  Non Anion Gap Metabolic Acidosis: follow off IVF, hyperchloremic  Physical debility/ambulatory dysfunction PT OT recommended SNF Fall precautions  Central Line: longstanding central line, will discontinued on 5/25  Goals of care: Palliative care following, now DNR.    DVT prophylaxis: heparin Code Status: DNR Family Communication: none at bedside - discussed with husband Disposition Plan: pending   Consultants:   Palliative care  Surgery  Cardiology  Procedures:  Cardioversion 08/20/2018  Ex laparotomy with lysis of adhesion, bowel resection for ischemic bowel  Echo IMPRESSIONS   1. The left ventricle has Aijalon Kirtz visually estimated ejection fraction of approximately 30%. There is inferior basal and inferoseptal akinesis. The cavity size and wall thickness are normal. Diastolic function is indeterminate. 2. The right ventricle has normal systolic function. The cavity was normal. There is no increase in right ventricular wall thickness. Device wire noted in right ventricle. RV-RA gradient 25.9 mmHg, unable to estimate RVSP. 3. Left atrial size was moderately dilated. 4. The aortic valve is tricuspid. Aortic valve regurgitation is trivial by color flow Doppler. Mild aortic annular  calcification noted. 5. The mitral valve is grossly normal. There is mild mitral annular calcification present. 6. The aortic root is normal in size and structure. 7. The tricuspid valve is grossly normal. 8. The pericardial effusion is circumferential. 9. Small to moderate pericardial effusion with associated adipose tissue/organization.  Antimicrobials: Anti-infectives (From admission, onward)   Start     Dose/Rate Route Frequency Ordered Stop   08/23/18 1400  piperacillin-tazobactam (ZOSYN) IVPB 2.25 g     2.25 g 100 mL/hr over 30 Minutes Intravenous Every 8 hours 08/23/18 0928 08/30/18 2230   08/21/18 1200  piperacillin-tazobactam (ZOSYN) IVPB 3.375 g  Status:  Discontinued     3.375 g 12.5 mL/hr over 240 Minutes Intravenous Every 8 hours 08/21/18 1102 08/23/18 0928   08/14/18 1000  hydroxychloroquine (PLAQUENIL) tablet 200 mg  Status:  Discontinued    Note to Pharmacy:  Take 1 tablet by mouth daily Monday through Friday only     200 mg Oral Once per day on Mon Tue Wed Thu Fri 08/14/18 0742 08/21/18 1147   08/13/18 2200  doxycycline (VIBRA-TABS) tablet 100 mg  Status:  Discontinued     100 mg Oral Every 12 hours 08/13/18 2134 08/13/18 2135   08/13/18 2200  doxycycline (VIBRAMYCIN) 100 mg in sodium chloride 0.9 % 250 mL IVPB  Status:  Discontinued     100 mg 125 mL/hr over 120 Minutes Intravenous Every 12 hours 08/13/18 2135 08/19/18 1702   08/13/18 2145  cefTRIAXone (ROCEPHIN) 1 g in sodium chloride 0.9 % 100 mL IVPB  Status:  Discontinued     1 g 200 mL/hr over 30 Minutes Intravenous Every 24 hours 08/13/18 2134 08/14/18 0743     Subjective: No complaints. Comfortable with cortrak.  Objective: Vitals:   09/16/18 0022 09/16/18 0450 09/16/18 0823 09/16/18 1100  BP: (!) 136/48 (!) 142/59 (!) 101/44 (!) 119/45  Pulse: 70 78 70 71  Resp: 19 16 (!) 21 17  Temp: 97.6 F (36.4 C) 97.7 F (36.5 C) 98.2 F (36.8 C) 98.2 F (36.8 C)  TempSrc: Oral Oral Oral Oral  SpO2:  100% 100%  100%  Weight:      Height:        Intake/Output Summary (Last 24 hours) at 09/16/2018 1657 Last data filed at 09/16/2018 1614 Gross per 24 hour  Intake 2321.76 ml  Output 1475 ml  Net 846.76 ml   Filed Weights   09/05/18 0325 09/09/18 0645 09/12/18 0251  Weight: 68.8 kg 62.6 kg 61.7 kg    Examination:  General: No acute distress. Cardiovascular: Heart sounds show Kwesi Sangha regular rate, and rhythm. Lungs: Clear to auscultation bilaterally Abdomen: Soft, nontender, nondistended.  cortrak in place.  Dressing c/d/i. Neurological: Alert.  Hard of hearing. Moves all extremities 4. Cranial nerves II through XII grossly intact. Skin: Warm and dry. No rashes or lesions. Extremities: No clubbing or cyanosis. No edema.  Psychiatric: Mood and affect are normal. Insight and judgment are appropraite.     Data Reviewed: I have personally reviewed following labs and imaging studies  CBC: Recent Labs  Lab 09/12/18 0236 09/13/18 0950 09/14/18 0906 09/15/18 0459 09/16/18 0325  WBC 14.1* 12.3* 12.1* 13.0* 12.2*  NEUTROABS  --   --  9.6* 12.2*  --   HGB 8.0* 7.8* 8.2* 8.4* 7.9*  HCT 26.9* 26.5* 27.7* 27.5* 26.5*  MCV 91.8 92.7 93.6 93.2 92.3  PLT 194 181 190 193 269   Basic Metabolic Panel: Recent Labs  Lab 09/10/18 0545  09/11/18 0500  09/12/18 0236 09/13/18 0950 09/14/18 0906 09/15/18 0459 09/15/18 1436 09/16/18 0325  NA 156*   < > 155*   < > 145 142 145 145  --  145  K 4.2   < > 4.2   < > 4.4 3.7 3.9 5.5* 5.1 5.0  CL 111   < > 112*   < > 108 108 112* 115*  --  114*  CO2 30   < > 29   < > 27 25 23  17*  --  20*  GLUCOSE 112*   < > 212*   < > 94 93 129* 90  --  124*  BUN 154*   < > 139*   < > 110* 84* 85* 88*  --  69*  CREATININE 1.96*   < > 1.91*   < > 1.71* 1.50* 1.62* 1.49*  --  1.41*  CALCIUM 8.7*   < > 8.8*   < > 8.4* 8.3* 8.5* 8.5*  --  8.5*  MG 2.4  --  2.4  --  2.2  --   --  2.2  --  1.9  PHOS 5.0*  --  5.3*  --   --   --   --  3.7  --   --    < > = values  in this interval not displayed.   GFR: Estimated Creatinine Clearance: 27.9 mL/min (Naomie Crow) (by C-G formula based on SCr of 1.41 mg/dL (H)). Liver Function Tests: Recent Labs  Lab 09/11/18 0500 09/12/18 0236 09/15/18 0459 09/16/18 0325  AST 41 42* 60* 63*  ALT 56* 56* 75* 78*  ALKPHOS 155* 151* 150* 159*  BILITOT 4.1* 4.2* 4.6* 4.2*  PROT 5.7* 5.4* 5.5* 5.1*  ALBUMIN 1.8*  1.7* 1.7* 1.7*   No results for input(s): LIPASE, AMYLASE in the last 168 hours. No results for input(s): AMMONIA in the last 168 hours. Coagulation Profile: No results for input(s): INR, PROTIME in the last 168 hours. Cardiac Enzymes: No results for input(s): CKTOTAL, CKMB, CKMBINDEX, TROPONINI in the last 168 hours. BNP (last 3 results) No results for input(s): PROBNP in the last 8760 hours. HbA1C: No results for input(s): HGBA1C in the last 72 hours. CBG: Recent Labs  Lab 09/16/18 0111 09/16/18 0444 09/16/18 0804 09/16/18 1224 09/16/18 1617  GLUCAP 88 94 82 101* 213*   Lipid Profile: Recent Labs    09/15/18 0459  TRIG 94   Thyroid Function Tests: No results for input(s): TSH, T4TOTAL, FREET4, T3FREE, THYROIDAB in the last 72 hours. Anemia Panel: No results for input(s): VITAMINB12, FOLATE, FERRITIN, TIBC, IRON, RETICCTPCT in the last 72 hours. Sepsis Labs: No results for input(s): PROCALCITON, LATICACIDVEN in the last 168 hours.  Recent Results (from the past 240 hour(s))  Culture, blood (routine x 2)     Status: None   Collection Time: 09/09/18  7:22 AM  Result Value Ref Range Status   Specimen Description BLOOD RIGHT ANTECUBITAL  Final   Special Requests   Final    BOTTLES DRAWN AEROBIC ONLY Blood Culture results may not be optimal due to an inadequate volume of blood received in culture bottles   Culture   Final    NO GROWTH 5 DAYS Performed at [facility], [place] [address] [place], [place] [address]    Report Status 09/14/2018 FINAL  Final  Culture, blood (routine x 2)      Status: None   Collection Time: 09/09/18  7:28 AM  Result Value Ref Range Status   Specimen Description BLOOD RIGHT HAND  Final   Special Requests   Final    BOTTLES DRAWN AEROBIC ONLY Blood Culture adequate volume   Culture   Final    NO GROWTH 5 DAYS Performed at [facility], [place] [address] [place], [place] [address]    Report Status 09/14/2018 FINAL  Final  Culture, Urine     Status: Abnormal   Collection Time: 09/09/18 10:30 AM  Result Value Ref Range Status   Specimen Description URINE, CLEAN CATCH  Final   Special Requests   Final    NONE Performed at [facility], [place] [address] [place], [place] [address]    Culture (Consetta Cosner)  Final    30,000 COLONIES/mL ESCHERICHIA COLI Confirmed Extended Spectrum Beta-Lactamase Producer (ESBL).  In bloodstream infections from ESBL organisms, carbapenems are preferred over piperacillin/tazobactam. They are shown to have Valbona Slabach lower risk of mortality.    Report Status 09/12/2018 FINAL  Final   Organism ID, Bacteria ESCHERICHIA COLI (Carry Weesner)  Final      Susceptibility   Escherichia coli - MIC*    AMPICILLIN >=32 RESISTANT Resistant     CEFAZOLIN >=64 RESISTANT Resistant     CEFTRIAXONE >=64 RESISTANT Resistant     CIPROFLOXACIN >=4 RESISTANT Resistant     GENTAMICIN >=16 RESISTANT Resistant     IMIPENEM <=0.25 SENSITIVE Sensitive     NITROFURANTOIN <=16 SENSITIVE Sensitive     TRIMETH/SULFA <=20 SENSITIVE Sensitive     AMPICILLIN/SULBACTAM >=32 RESISTANT Resistant     PIP/TAZO 64 INTERMEDIATE Intermediate     Extended ESBL POSITIVE Resistant     * 30,000 COLONIES/mL ESCHERICHIA COLI         Radiology Studies: [place] Ekg Site  Rite  Result Date: 09/15/2018 If Site Rite image not attached, placement could not be confirmed due to current cardiac rhythm.       Scheduled Meds: . amiodarone  200 mg Oral Daily  . carvedilol  3.125 mg Oral BID WC  . chlorhexidine  15 mL Mouth Rinse BID  . feeding supplement (PRO-STAT SUGAR FREE 64)   30 mL Per Tube BID  . heparin injection (subcutaneous)  5,000 Units Subcutaneous Q8H  . insulin aspart  0-20 Units Subcutaneous Q4H  . mouth rinse  15 mL Mouth Rinse q12n4p  . mupirocin ointment   Nasal BID  . pantoprazole  40 mg Oral Daily  . predniSONE  40 mg Oral Q breakfast  . saccharomyces boulardii  250 mg Oral BID   Continuous Infusions: . dextrose 75 mL/hr at 09/16/18 1614  . feeding supplement (OSMOLITE 1.5 CAL) 1,000 mL (09/16/18 1314)     LOS: 34 days    Time spent: over 30 min    Fayrene Helper, MD [facility] Pager AMION  If 7PM-7AM, please contact night-coverage [email or web address] Password [facility] 09/16/2018, 4:57 PM

## 2018-09-16 NOTE — Progress Notes (Signed)
Inpatient Diabetes Program Recommendations  AACE/ADA: New Consensus Statement on Inpatient Glycemic Control (2015)  Target Ranges:  Prepandial:   less than 140 mg/dL      Peak postprandial:   less than 180 mg/dL (1-2 hours)      Critically ill patients:  140 - 180 mg/dL   Results for ALYSSAH, ALGEO (MRN [id number]) as of 09/16/2018 10:00  Ref. Range 09/15/2018 08:05 09/15/2018 12:31 09/15/2018 16:56 09/15/2018 18:00 09/15/2018 20:11 09/16/2018 00:21 09/16/2018 00:26 09/16/2018 00:43 09/16/2018 01:11 09/16/2018 04:44 09/16/2018 08:04  Glucose-Capillary Latest Ref Range: 70 - 99 mg/dL 144 (H) 139 (H) 56 (L) 99 154 (H) 49 (L) 42 (LL) 56 (L) 88 94 82    Review of Glycemic Control  Diabetes history: None  Current orders for Inpatient glycemic control:  Novolog 0-20 units   Inpatient Diabetes Program Recommendations:    Patient no longer receiving steroids, renal function elevated. Pt still on Resistant Correction scale.   Consider decreasing Novolog correction scale to 0-9 units tid.  Thanks,  Tama Headings RN, MSN, BC-ADM Inpatient Diabetes Coordinator Team Pager [phone] (8a-5p)

## 2018-09-16 NOTE — Progress Notes (Signed)
CBG 49, repeated again 49 mg/dl, Pt had low to almost no appetite. Initiated hypoglycemic standing order set. Encouraged to drink apple juice and Sprite 8 oz. Rechecked CBG every 15 minutes, later her CBG 88 mg/dl. Remained unclear hypoglycemia symptom due to her based line of confusion. Apparently she was alert and able to follow simple commands, her vital remained stable. Tried to page Dr, Tylene Fantasia, on-call internal med. Awaiting for response. Continue to monitor.   Kennyth Lose,, BSN, RN,PCCN,CMC,CSC

## 2018-09-16 NOTE — Progress Notes (Signed)
Patient ID: [name], female   DOB: July 10, 1940, 78 y.o.   MRN: [id number]    26 Days Post-Op  Subjective: Patient more talkative this week than last.  No pain.  Objective: Vital signs in last 24 hours: Temp:  [97.6 F (36.4 C)-98.3 F (36.8 C)] 97.7 F (36.5 C) (05/26 0450) Pulse Rate:  [69-78] 78 (05/26 0450) Resp:  [15-19] 16 (05/26 0450) BP: (123-145)/(48-73) 142/59 (05/26 0450) SpO2:  [99 %-100 %] 100 % (05/26 0450) Last BM Date: 09/16/18  Intake/Output from previous day: 05/25 0701 - 05/26 0700 In: 2214.2 [P.O.:340; I.V.:1874.2] Out: 1475 [Urine:1475] Intake/Output this shift: No intake/output data recorded.  PE: Abd: soft, wound is stable and dehisced with bowel present.  mepitel in place and WD dressing over that.  No current signs of a fistula present.  Lab Results:  Recent Labs    09/15/18 0459 09/16/18 0325  WBC 13.0* 12.2*  HGB 8.4* 7.9*  HCT 27.5* 26.5*  PLT 193 189   BMET Recent Labs    09/15/18 0459 09/15/18 1436 09/16/18 0325  NA 145  --  145  K 5.5* 5.1 5.0  CL 115*  --  114*  CO2 17*  --  20*  GLUCOSE 90  --  124*  BUN 88*  --  69*  CREATININE 1.49*  --  1.41*  CALCIUM 8.5*  --  8.5*   PT/INR No results for input(s): LABPROT, INR in the last 72 hours. CMP     Component Value Date/Time   NA 145 09/16/2018 0325   K 5.0 09/16/2018 0325   CL 114 (H) 09/16/2018 0325   CO2 20 (L) 09/16/2018 0325   GLUCOSE 124 (H) 09/16/2018 0325   BUN 69 (H) 09/16/2018 0325   CREATININE 1.41 (H) 09/16/2018 0325   CREATININE 1.07 (H) 02/13/2018 1142   CALCIUM 8.5 (L) 09/16/2018 0325   PROT 5.1 (L) 09/16/2018 0325   ALBUMIN 1.7 (L) 09/16/2018 0325   AST 63 (H) 09/16/2018 0325   ALT 78 (H) 09/16/2018 0325   ALKPHOS 159 (H) 09/16/2018 0325   BILITOT 4.2 (H) 09/16/2018 0325   GFRNONAA 35 (L) 09/16/2018 0325   GFRNONAA 49 (L) 02/13/2018 1142   GFRAA 41 (L) 09/16/2018 0325   GFRAA 57 (L) 02/13/2018 1142   Lipase     Component Value Date/Time   LIPASE 30 08/13/2018 1926       Studies/Results: [place] Ekg Site Rite  Result Date: 09/15/2018 If Site Rite image not attached, placement could not be confirmed due to current cardiac rhythm.   Anti-infectives: Anti-infectives (From admission, onward)   Start     Dose/Rate Route Frequency Ordered Stop   08/23/18 1400  piperacillin-tazobactam (ZOSYN) IVPB 2.25 g     2.25 g 100 mL/hr over 30 Minutes Intravenous Every 8 hours 08/23/18 0928 08/30/18 2230   08/21/18 1200  piperacillin-tazobactam (ZOSYN) IVPB 3.375 g  Status:  Discontinued     3.375 g 12.5 mL/hr over 240 Minutes Intravenous Every 8 hours 08/21/18 1102 08/23/18 0928   08/14/18 1000  hydroxychloroquine (PLAQUENIL) tablet 200 mg  Status:  Discontinued    Note to Pharmacy:  Take 1 tablet by mouth daily Monday through Friday only     200 mg Oral Once per day on Mon Tue Wed Thu Fri 08/14/18 0742 08/21/18 1147   08/13/18 2200  doxycycline (VIBRA-TABS) tablet 100 mg  Status:  Discontinued     100 mg Oral Every 12 hours 08/13/18 2134 08/13/18 2135  08/13/18 2200  doxycycline (VIBRAMYCIN) 100 mg in sodium chloride 0.9 % 250 mL IVPB  Status:  Discontinued     100 mg 125 mL/hr over 120 Minutes Intravenous Every 12 hours 08/13/18 2135 08/19/18 1702   08/13/18 2145  cefTRIAXone (ROCEPHIN) 1 g in sodium chloride 0.9 % 100 mL IVPB  Status:  Discontinued     1 g 200 mL/hr over 30 Minutes Intravenous Every 24 hours 08/13/18 2134 08/14/18 0743       Assessment/Plan Acute on chronic renal failure- Cr 1.41, improving  ICD/CAD/CHF-per cardiology Acute Respiratory Failure-resolved.  ABL Anemia-hgb stable  SBO&ischemic/necroticbowel - S/Pex lapw/SBR, LOA, appendectomy, 04/30, Dr. Marlou Starks, POD 26 -prealbumin34 on 05/19, continue calorie count -completed zosyn x10 days, afebrile, WBCdown to 12.2 and continues to trend down - Mobilize and IS - wound remains dehisced,mepiteland wet to dry - CT Abd/pel05/19relatively  unimpressive.  FEN -TNA stopped, Cortrak to be placed today,DYS1, calorie count VTE - SCDs,okay for lovenox from our standpoint but will defer to medicine ID -zosyn 4/30>>5/9. Foley - Removed 5/7 Follow up - Dr. Marlou Starks  Plan: continue dressing changes. Encourage PO intake. Continue calorie count. Palliative care following. Appreciate their assistance.    LOS: 34 days    [name] , [facility] Surgery 09/16/2018, 8:05 AM Pager: [phone]

## 2018-09-16 NOTE — Progress Notes (Signed)
New order is received from Dr. Tylene Fantasia for 5% Dextrose in water 75 ml/hr and D/C Lactate Ringer. No distress has seen. Vital signs remains stable. Will continue to monitor CBG q 4 hrs.  Kennyth Lose, BSN,RN,PCCN-CMC,CSC

## 2018-09-16 NOTE — Procedures (Signed)
Cortrak  Tube Type:  Cortrak - 43 inches Tube Location:  Left nare Initial Placement:  Stomach Secured by: Bridle Technique Used to Measure Tube Placement:  Documented cm marking at nare/ corner of mouth Cortrak Secured At:  65 cm    Cortrak Tube Team Note:  Consult received to place a Cortrak feeding tube.   No x-ray is required. RN may begin using tube.   If the tube becomes dislodged please keep the tube and contact the Cortrak team at [email or web address] (password [email or web address]) for replacement.  If after hours and replacement cannot be delayed, place a NG tube and confirm placement with an abdominal x-ray.    Chirstina Haan MS, RD, LDN Pager #- [phone] Office#- [phone] After Hours Pager: [phone]   

## 2018-09-17 LAB — COMPREHENSIVE METABOLIC PANEL
ALT: 83 U/L — ABNORMAL HIGH (ref 0–44)
AST: 59 U/L — ABNORMAL HIGH (ref 15–41)
Albumin: 1.6 g/dL — ABNORMAL LOW (ref 3.5–5.0)
Alkaline Phosphatase: 162 U/L — ABNORMAL HIGH (ref 38–126)
Anion gap: 10 (ref 5–15)
BUN: 66 mg/dL — ABNORMAL HIGH (ref 8–23)
CO2: 21 mmol/L — ABNORMAL LOW (ref 22–32)
Calcium: 8.1 mg/dL — ABNORMAL LOW (ref 8.9–10.3)
Chloride: 110 mmol/L (ref 98–111)
Creatinine, Ser: 1.42 mg/dL — ABNORMAL HIGH (ref 0.44–1.00)
GFR calc Af Amer: 41 mL/min — ABNORMAL LOW (ref >60)
GFR calc non Af Amer: 35 mL/min — ABNORMAL LOW (ref >60)
Glucose, Bld: 176 mg/dL — ABNORMAL HIGH (ref 70–99)
Potassium: 5.4 mmol/L — ABNORMAL HIGH (ref 3.5–5.1)
Sodium: 141 mmol/L (ref 135–145)
Total Bilirubin: 4.2 mg/dL — ABNORMAL HIGH (ref 0.3–1.2)
Total Protein: 5.1 g/dL — ABNORMAL LOW (ref 6.5–8.1)

## 2018-09-17 LAB — CBC
HCT: 27.2 % — ABNORMAL LOW (ref 36.0–46.0)
Hemoglobin: 8.1 g/dL — ABNORMAL LOW (ref 12.0–15.0)
MCH: 27.8 pg (ref 26.0–34.0)
MCHC: 29.8 g/dL — ABNORMAL LOW (ref 30.0–36.0)
MCV: 93.5 fL (ref 80.0–100.0)
Platelets: 189 10*3/uL (ref 150–400)
RBC: 2.91 MIL/uL — ABNORMAL LOW (ref 3.87–5.11)
RDW: 22.5 % — ABNORMAL HIGH (ref 11.5–15.5)
WBC: 12.1 10*3/uL — ABNORMAL HIGH (ref 4.0–10.5)
nRBC: 0.2 % (ref 0.0–0.2)

## 2018-09-17 LAB — MAGNESIUM: Magnesium: 1.7 mg/dL (ref 1.7–2.4)

## 2018-09-17 LAB — GLUCOSE, CAPILLARY
Glucose-Capillary: 138 mg/dL — ABNORMAL HIGH (ref 70–99)
Glucose-Capillary: 167 mg/dL — ABNORMAL HIGH (ref 70–99)
Glucose-Capillary: 176 mg/dL — ABNORMAL HIGH (ref 70–99)
Glucose-Capillary: 177 mg/dL — ABNORMAL HIGH (ref 70–99)
Glucose-Capillary: 193 mg/dL — ABNORMAL HIGH (ref 70–99)
Glucose-Capillary: 200 mg/dL — ABNORMAL HIGH (ref 70–99)
Glucose-Capillary: 257 mg/dL — ABNORMAL HIGH (ref 70–99)

## 2018-09-17 LAB — PHOSPHORUS: Phosphorus: 3 mg/dL (ref 2.5–4.6)

## 2018-09-17 NOTE — Progress Notes (Signed)
Spoke with patient's husband and updated him on patient status.

## 2018-09-17 NOTE — Progress Notes (Signed)
Abdominal dressing changed per MD order. Patient tolerated well. Patient cleaned and linens changed. New foam dressing applied to sacral area.

## 2018-09-17 NOTE — Progress Notes (Signed)
[name]  PROGRESS NOTE    [name]  OAC:[id number] DOB: [birth year] DOA: 08/13/2018 PCP: [name], MD   Brief Narrative:   [name] [name] a 78 y.o.femalewith medical history significant ofanemia chronic disease, ASCVD, AICD placement, history of breast cancer, chronic bronchitis, stage III chronic renal disease, systolic heart failure, hyperlipidemia, GERD, history of GI bleed, gout, hypertension, rheumatoid arthritis, impaired hearing, history of UTI who is coming to the emergency department with complaints of abdominal pain apparently for the past 3 days associated with decreased appetite, several episodes of diarrhea, nausea, but no emesis. She denies fever, cough, sore throat, chest pain, dysuria, frequency, hematuria, melena or hematochezia. She denies polyuria, polydipsia, polyphagia or blurred vision. History is limited by the patient severe hearing impairment.  Interim history Patient was admitted with small bowel obstruction, failed conservative management and was transferred to [name]. General surgery consulted and appreciated, patient underwent ex lap with lysis of adhesions, bowel resection for ischemic bowel. She continued on the ventilator was put in ICU as she was also hypotensive and started on pressors. Patient was also shocked for VT. She developed progressive renal failure, oliguria. She was weaned off of pressors and extubated. And [facility] assumed care. Started on antibiotics for possible pneumonia. Patient now with prolonged postop ileus, ongoing TNA, diet gradually being advanced per speech therapy. She was also noted to have a self-limiting GI bleed. Patient had acute respiratory distress on 09/01/2018 with decompensated CHF and upper airway wheezing. PCCM consulted.General surgery following.  She's had a long hospitalization after ex lap for small bowel obstruction with ischemic and necrotic bowel.  She had ex lap with small bowel  resection, lysis of adhessions, and appendectomy by Dr. Marlou Starks.  Hospitalization has been c/b aki, acute hypoxic respiratory failure and wound dehiscence.  Was receiving TPN via central line and now has contrak for nutrition.   Assessment & Plan:   Principal Problem:   CAP (community acquired pneumonia) Active Problems:   Dyslipidemia   Anemia of chronic disease   CAD S/P remote PCI- no details   Chronic combined systolic and diastolic CHF (congestive heart failure) (HCC)   AKI (acute kidney injury) (HCC)   SBO (small bowel obstruction) (HCC)   Pressure injury of skin   Acute renal failure superimposed on stage 3 chronic kidney disease (HCC)   Wide-complex tachycardia (HCC)   Protein-calorie malnutrition, severe   Acute respiratory failure with hypoxia (HCC)   Goals of care, counseling/discussion   Palliative care by specialist   Acute hypoxic respiratory failure likely multifactorial secondary to decompensated CHF and a component of bronchospasm     - Initially admitted for community-acquired pneumonia, completed her course of antibiotics     - PCCM consulted was briefly on BiPAP     - Responded well to IV Lasix and Solu-Medrol     - Continue bronchodilators and BiPAP as needed     - prednisone 40 mg daily; taper  Small bowel obstruction/ischemic colon Status post ex lap, appendectomy/Post op ileus/Dehiscence of Wound     - General surgery following     - Patient failed conservative management of bowel obstruction at [facility]     - Status post exploratory laparotomy for SBO and small bowel resection of ischemic necrotic bowel     - Completed 10 days of IV Zosyn     - Wound VAC removed on 08/27/2018     - Wound dressing as recommended by general surgery      -  No plans for any additional surgery.  Wound has dehisced.       - TPN discontinued.  Continue to encourage PO.       - Cortrak placed 5/26, now on tube feeds.  Hypernatremia     - Sodium as high as 156 on 09/10/2018;  resolved  AKI on CKD 3     - Creatinine at baseline 1.5 with GFR of 38     - Creatinine peaked at 3.21     - resolved     - Follow UOP     - Continue to avoid nephrotoxic agents  Resolved septic shock     - Post VDRF and pressure support     - Extubated on 08/24/2018     - Weaned off pressors on 08/23/2018  Leukocytosis likely reactive from steroid use versus recent surgery     - DC IV Solu-Medrol 40 mg daily and start prednisone 40 mg daily on 09/14/2018 - will need taper     - WBC stable, afebrile, No sign of active infective process  Elevated LFTs     - Alkaline phosphatase, AST and ALT elevated; but stable  Non Anion Gap Metabolic Acidosis     - follow off IVF, hyperchloremic; improving  Physical debility/ambulatory dysfunction     - PT OT recommended SNF     - Fall precautions  Goals of care:      - Palliative care following, now DNR.   DVT prophylaxis: heparin Code Status: DNR   Disposition Plan: TBD   Consultants:   General Surgery  Cardiology  Palliative Care    Subjective: Denies complaints.   Objective: Vitals:   09/17/18 0000 09/17/18 0400 09/17/18 0420 09/17/18 0832  BP: (!) 124/52 (!) 125/47 (!) 121/46 (!) 117/48  Pulse: 73 81 79 73  Resp: (!) 23 18 (!) 22   Temp:   99 F (37.2 C)   TempSrc:   Oral   SpO2: 100% 100% 100%   Weight:   67.7 kg   Height:        Intake/Output Summary (Last 24 hours) at 09/17/2018 1430 Last data filed at 09/17/2018 0700 Gross per 24 hour  Intake 2064.59 ml  Output 6 ml  Net 2058.59 ml   Filed Weights   09/09/18 0645 09/12/18 0251 09/17/18 0420  Weight: 62.6 kg 61.7 kg 67.7 kg    Examination:  General exam: 78 y.o. female Appears calm and comfortable  Respiratory system: Clear to auscultation. Respiratory effort normal. Cardiovascular system: S1 & S2 heard, RRR. No JVD, murmurs, rubs, gallops or clicks. No pedal edema. Gastrointestinal system: BS+, surgical dressing CDI, soft, non-tender Central  nervous system: Alert and oriented. No focal neurological deficits. She is hard of hearing Extremities: Symmetric 5 x 5 power.    Data Reviewed: I have personally reviewed following labs and imaging studies.  CBC: Recent Labs  Lab 09/13/18 0950 09/14/18 0906 09/15/18 0459 09/16/18 0325 09/17/18 0230  WBC 12.3* 12.1* 13.0* 12.2* 12.1*  NEUTROABS  --  9.6* 12.2*  --   --   HGB 7.8* 8.2* 8.4* 7.9* 8.1*  HCT 26.5* 27.7* 27.5* 26.5* 27.2*  MCV 92.7 93.6 93.2 92.3 93.5  PLT 181 190 193 189 244   Basic Metabolic Panel: Recent Labs  Lab 09/11/18 0500  09/12/18 0236 09/13/18 0950 09/14/18 0906 09/15/18 0459 09/15/18 1436 09/16/18 0325 09/17/18 0230  NA 155*   < > 145 142 145 145  --  145 141  K 4.2   < >  4.4 3.7 3.9 5.5* 5.1 5.0 5.4*  CL 112*   < > 108 108 112* 115*  --  114* 110  CO2 29   < > 27 25 23  17*  --  20* 21*  GLUCOSE 212*   < > 94 93 129* 90  --  124* 176*  BUN 139*   < > 110* 84* 85* 88*  --  69* 66*  CREATININE 1.91*   < > 1.71* 1.50* 1.62* 1.49*  --  1.41* 1.42*  CALCIUM 8.8*   < > 8.4* 8.3* 8.5* 8.5*  --  8.5* 8.1*  MG 2.4  --  2.2  --   --  2.2  --  1.9 1.7  PHOS 5.3*  --   --   --   --  3.7  --   --  3.0   < > = values in this interval not displayed.   GFR: Estimated Creatinine Clearance: 30.4 mL/min (A) (by C-G formula based on SCr of 1.42 mg/dL (H)). Liver Function Tests: Recent Labs  Lab 09/11/18 0500 09/12/18 0236 09/15/18 0459 09/16/18 0325 09/17/18 0230  AST 41 42* 60* 63* 59*  ALT 56* 56* 75* 78* 83*  ALKPHOS 155* 151* 150* 159* 162*  BILITOT 4.1* 4.2* 4.6* 4.2* 4.2*  PROT 5.7* 5.4* 5.5* 5.1* 5.1*  ALBUMIN 1.8* 1.7* 1.7* 1.7* 1.6*   No results for input(s): LIPASE, AMYLASE in the last 168 hours. No results for input(s): AMMONIA in the last 168 hours. Coagulation Profile: No results for input(s): INR, PROTIME in the last 168 hours. Cardiac Enzymes: No results for input(s): CKTOTAL, CKMB, CKMBINDEX, TROPONINI in the last 168 hours. BNP  (last 3 results) No results for input(s): PROBNP in the last 8760 hours. HbA1C: No results for input(s): HGBA1C in the last 72 hours. CBG: Recent Labs  Lab 09/16/18 2009 09/17/18 0003 09/17/18 0429 09/17/18 0813 09/17/18 1143  GLUCAP 174* 193* 200* 138* 167*   Lipid Profile: Recent Labs    09/15/18 0459  TRIG 94   Thyroid Function Tests: No results for input(s): TSH, T4TOTAL, FREET4, T3FREE, THYROIDAB in the last 72 hours. Anemia Panel: No results for input(s): VITAMINB12, FOLATE, FERRITIN, TIBC, IRON, RETICCTPCT in the last 72 hours. Sepsis Labs: No results for input(s): PROCALCITON, LATICACIDVEN in the last 168 hours.  Recent Results (from the past 240 hour(s))  Culture, blood (routine x 2)     Status: None   Collection Time: 09/09/18  7:22 AM  Result Value Ref Range Status   Specimen Description BLOOD RIGHT ANTECUBITAL  Final   Special Requests   Final    BOTTLES DRAWN AEROBIC ONLY Blood Culture results may not be optimal due to an inadequate volume of blood received in culture bottles   Culture   Final    NO GROWTH 5 DAYS Performed at [facility], [place] [address], [place], [place] [address]    Report Status 09/14/2018 FINAL  Final  Culture, blood (routine x 2)     Status: None   Collection Time: 09/09/18  7:28 AM  Result Value Ref Range Status   Specimen Description BLOOD RIGHT HAND  Final   Special Requests   Final    BOTTLES DRAWN AEROBIC ONLY Blood Culture adequate volume   Culture   Final    NO GROWTH 5 DAYS Performed at [facility], [place] [address] [place], [place] [address]    Report Status 09/14/2018 FINAL  Final  Culture, Urine  Status: Abnormal   Collection Time: 09/09/18 10:30 AM  Result Value Ref Range Status   Specimen Description URINE, CLEAN CATCH  Final   Special Requests   Final    NONE Performed at [facility], [address] [place], [place] [address]    Culture (A)  Final    30,000 COLONIES/mL ESCHERICHIA  COLI Confirmed Extended Spectrum Beta-Lactamase Producer (ESBL).  In bloodstream infections from ESBL organisms, carbapenems are preferred over piperacillin/tazobactam. They are shown to have a lower risk of mortality.    Report Status 09/12/2018 FINAL  Final   Organism ID, Bacteria ESCHERICHIA COLI (A)  Final      Susceptibility   Escherichia coli - MIC*    AMPICILLIN >=32 RESISTANT Resistant     CEFAZOLIN >=64 RESISTANT Resistant     CEFTRIAXONE >=64 RESISTANT Resistant     CIPROFLOXACIN >=4 RESISTANT Resistant     GENTAMICIN >=16 RESISTANT Resistant     IMIPENEM <=0.25 SENSITIVE Sensitive     NITROFURANTOIN <=16 SENSITIVE Sensitive     TRIMETH/SULFA <=20 SENSITIVE Sensitive     AMPICILLIN/SULBACTAM >=32 RESISTANT Resistant     PIP/TAZO 64 INTERMEDIATE Intermediate     Extended ESBL POSITIVE Resistant     * 30,000 COLONIES/mL ESCHERICHIA COLI         Radiology Studies: No results found.      Scheduled Meds:  amiodarone  200 mg Oral Daily   carvedilol  3.125 mg Oral BID WC   chlorhexidine  15 mL Mouth Rinse BID   feeding supplement (PRO-STAT SUGAR FREE 64)  30 mL Per Tube BID   heparin injection (subcutaneous)  5,000 Units Subcutaneous Q8H   insulin aspart  0-20 Units Subcutaneous Q4H   mouth rinse  15 mL Mouth Rinse q12n4p   mupirocin ointment   Nasal BID   pantoprazole  40 mg Oral Daily   predniSONE  40 mg Oral Q breakfast   saccharomyces boulardii  250 mg Oral BID   Continuous Infusions:  feeding supplement (OSMOLITE 1.5 CAL) 1,000 mL (09/17/18 0852)     LOS: 35 days    Time spent: 25 minutes spent in the coordination of care today.    Jonnie Finner, DO [facility] Pager [phone]  If 7PM-7AM, please contact night-coverage [email or web address] Password [email or web address] 09/17/2018, 2:30 PM

## 2018-09-17 NOTE — Progress Notes (Signed)
@  2200 lost PIV. Pt DNR, has no scheduled IV meds, and stable. Will forgo access and reassess with Day Team. Provider on-call (K. Kirby-Graham) paged and notified.

## 2018-09-17 NOTE — Progress Notes (Signed)
Review of flowsheet completed due to PIV consult. No IV medications ordered at this time. Noted prior unsuccessful attempt by VAS Team. Pt is DNR status. Discussed with primary RN and recommended no PIV start at this time based on findings. RN in agreement.

## 2018-09-18 DIAGNOSIS — R0902 Hypoxemia: Secondary | ICD-10-CM

## 2018-09-18 LAB — COMPREHENSIVE METABOLIC PANEL
ALT: 81 U/L — ABNORMAL HIGH (ref 0–44)
AST: 48 U/L — ABNORMAL HIGH (ref 15–41)
Albumin: 1.6 g/dL — ABNORMAL LOW (ref 3.5–5.0)
Alkaline Phosphatase: 166 U/L — ABNORMAL HIGH (ref 38–126)
Anion gap: 9 (ref 5–15)
BUN: 68 mg/dL — ABNORMAL HIGH (ref 8–23)
CO2: 21 mmol/L — ABNORMAL LOW (ref 22–32)
Calcium: 8 mg/dL — ABNORMAL LOW (ref 8.9–10.3)
Chloride: 112 mmol/L — ABNORMAL HIGH (ref 98–111)
Creatinine, Ser: 1.27 mg/dL — ABNORMAL HIGH (ref 0.44–1.00)
GFR calc Af Amer: 46 mL/min — ABNORMAL LOW (ref >60)
GFR calc non Af Amer: 40 mL/min — ABNORMAL LOW (ref >60)
Glucose, Bld: 157 mg/dL — ABNORMAL HIGH (ref 70–99)
Potassium: 4.8 mmol/L (ref 3.5–5.1)
Sodium: 142 mmol/L (ref 135–145)
Total Bilirubin: 3.3 mg/dL — ABNORMAL HIGH (ref 0.3–1.2)
Total Protein: 5 g/dL — ABNORMAL LOW (ref 6.5–8.1)

## 2018-09-18 LAB — PHOSPHORUS: Phosphorus: 2.7 mg/dL (ref 2.5–4.6)

## 2018-09-18 LAB — GLUCOSE, CAPILLARY
Glucose-Capillary: 141 mg/dL — ABNORMAL HIGH (ref 70–99)
Glucose-Capillary: 142 mg/dL — ABNORMAL HIGH (ref 70–99)
Glucose-Capillary: 164 mg/dL — ABNORMAL HIGH (ref 70–99)
Glucose-Capillary: 272 mg/dL — ABNORMAL HIGH (ref 70–99)
Glucose-Capillary: 107 mg/dL — ABNORMAL HIGH (ref 70–99)
Glucose-Capillary: 220 mg/dL — ABNORMAL HIGH (ref 70–99)

## 2018-09-18 LAB — CBC WITH DIFFERENTIAL/PLATELET
Abs Immature Granulocytes: 0.29 10*3/uL — ABNORMAL HIGH (ref 0.00–0.07)
Basophils Absolute: 0 10*3/uL (ref 0.0–0.1)
Basophils Relative: 0 %
Eosinophils Absolute: 0 10*3/uL (ref 0.0–0.5)
Eosinophils Relative: 0 %
HCT: 25.4 % — ABNORMAL LOW (ref 36.0–46.0)
Hemoglobin: 7.8 g/dL — ABNORMAL LOW (ref 12.0–15.0)
Immature Granulocytes: 2 %
Lymphocytes Relative: 8 %
Lymphs Abs: 0.9 10*3/uL (ref 0.7–4.0)
MCH: 28.3 pg (ref 26.0–34.0)
MCHC: 30.7 g/dL (ref 30.0–36.0)
MCV: 92 fL (ref 80.0–100.0)
Monocytes Absolute: 0.6 10*3/uL (ref 0.1–1.0)
Monocytes Relative: 5 %
Neutro Abs: 10.3 10*3/uL — ABNORMAL HIGH (ref 1.7–7.7)
Neutrophils Relative %: 85 %
Platelets: 187 10*3/uL (ref 150–400)
RBC: 2.76 MIL/uL — ABNORMAL LOW (ref 3.87–5.11)
RDW: 22.4 % — ABNORMAL HIGH (ref 11.5–15.5)
WBC: 12.2 10*3/uL — ABNORMAL HIGH (ref 4.0–10.5)
nRBC: 0.2 % (ref 0.0–0.2)

## 2018-09-18 LAB — MAGNESIUM: Magnesium: 1.8 mg/dL (ref 1.7–2.4)

## 2018-09-18 NOTE — Progress Notes (Signed)
Daily Progress Note   Patient Name: [name]       Date: 09/18/2018 DOB: 1941-01-19  Age: 78 y.o. MRN#: [id number] Attending Physician: Jonnie Finner, DO Primary Care Physician: [name], MD Admit Date: 08/13/2018  Reason for Consultation/Follow-up: Establishing goals of care  Subjective: Patient is awake, alert, confused. She will not follow commands. Continuous to stare around the room during conversation. Coretrak placed and in use for nutrition. Continues to show slow signs of improvement or recovery. Continues with poor po intake.   Spoke with husband via phone. He continues to remain hopeful that patient will show signs of improvement and will go to SNF for continued care. He is aware of her slow progress and poor nutritional intake. He is aware of Coretrak and tube feedings. He verbalized hopes that she will regain strength. I discussed in detail her prognosis, level of health care needs, and trajectory. Mr. 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 amiodarone  200 mg Oral Daily  . carvedilol  3.125 mg Oral BID WC  . chlorhexidine  15 mL Mouth Rinse BID  . feeding supplement (PRO-STAT SUGAR FREE 64)  30 mL Per Tube BID  . heparin injection (subcutaneous)  5,000 Units Subcutaneous Q8H  . insulin aspart  0-20 Units Subcutaneous Q4H  . mouth rinse  15 mL Mouth Rinse q12n4p  . mupirocin ointment   Nasal BID  . pantoprazole  40 mg Oral Daily  . predniSONE  40 mg Oral Q breakfast  . saccharomyces boulardii  250 mg Oral BID    Continuous Infusions: . feeding supplement (OSMOLITE 1.5 CAL) 1,000 mL (09/18/18 0423)    PRN Meds: acetaminophen **OR**  acetaminophen, albuterol, ondansetron **OR** ondansetron (ZOFRAN) IV, oxyCODONE, sodium chloride flush     Vital Signs: BP (!) 121/45 (BP Location: Right Arm)   Pulse 87   Temp 98.4 F (36.9 C) (Oral)   Resp 20   Ht 5\' 4"  (1.626 m)   Wt 64.5 kg   SpO2 100%   BMI 24.41 kg/m  SpO2: SpO2: 100 % O2 Device: O2 Device: Room Air O2 Flow Rate: O2 Flow Rate (L/min): 0 L/min  Intake/output summary:   Intake/Output Summary (Last 24 hours) at 09/18/2018 1152 Last data filed at 09/18/2018 0700 Gross per 24 hour  Intake 1380 ml  Output -  Net 1380 ml   LBM: Last BM Date: 09/18/18 Baseline Weight: Weight: 66.7 kg Most recent weight: Weight: 64.5 kg       Palliative Assessment/Data:CONFUSED/CORETRAK    Flowsheet Rows     Most Recent Value  Intake Tab  Referral Department  Surgery  Unit at Time of Referral  Intermediate Care Unit  Palliative Care Primary Diagnosis  Sepsis/Infectious Disease  Date Notified  09/10/18  Palliative Care Type  New Palliative care  Reason for referral  Clarify Goals of Care  Date of Admission  08/13/18  Date first seen by Palliative Care  09/11/18  # of days Palliative referral response time  1 Day(s)  # of days IP prior to Palliative referral  28  Clinical Assessment  Palliative Performance Scale Score  20%  Psychosocial & Spiritual Assessment  Palliative Care Outcomes  Patient/Family meeting held?  Yes  Who was at the meeting?  spouse  Palliative Care Outcomes  Clarified goals of care, Provided advance care planning, Provided psychosocial or spiritual support      Patient Active Problem List   Diagnosis Date Noted  . Goals of care, counseling/discussion   . Palliative care by specialist   . Acute respiratory failure with hypoxia ([place])   . Protein-calorie malnutrition, severe 08/25/2018  . Wide-complex tachycardia ([place])   . Acute renal failure superimposed on stage 3 chronic kidney disease ([place]) 08/18/2018  . SBO (small bowel obstruction)  ([place]) 08/14/2018  . Pressure injury of skin 08/14/2018  . CAP (community acquired pneumonia) 08/13/2018  . AKI (acute kidney injury) ([place]) 08/10/2017  . Hyperkalemia 08/10/2017  . Acute blood loss anemia 09/20/2016  . Ischemic cardiomyopathy 09/20/2016  . Chronic combined systolic and diastolic CHF (congestive heart failure) ([place]) 09/20/2016  . CKD (chronic kidney disease), stage IV ([place]) 09/20/2016  . History of breast cancer 08/02/2016  . Primary osteoarthritis of both knees 08/02/2016  . Primary osteoarthritis of both feet 08/02/2016  . History of anemia 07/17/2016  . History of CHF (congestive heart failure) 06/04/2016  . History of chronic kidney disease 06/04/2016  . Symptomatic anemia 06/04/2016  . Elevated sedimentation rate 03/03/2016  . Idiopathic chronic gout of multiple sites without tophus 03/03/2016  . Total knee replacement status, right 03/03/2016  . High risk medication use 03/03/2016  . Chronic kidney disease (CKD) stage G3b/A1, moderately decreased glomerular filtration rate (GFR) between 30-44 mL/min/1.73 square meter and albuminuria creatinine ratio less than 30 mg/g ([facility]) 02/01/2016  . CAD S/P remote PCI- no details 09/02/2014  . Cardiomyopathy, ischemic-EF 30-35% March 2015 09/02/2014  . Diastolic dysfunction-grade 2 09/02/2014  . CHF exacerbation ([place]) 08/28/2014  . Acute on chronic combined systolic and diastolic congestive heart failure ([place]) 08/28/2014  . Iron deficiency anemia 08/20/2013  . Malnutrition of moderate degree ([place]) 07/21/2013  . PNA (pneumonia) 07/19/2013  . HCAP (healthcare-associated pneumonia) 07/17/2013  . Sepsis ([place]) 07/17/2013  . ARF (acute renal failure) ([place]) 07/17/2013  . Rheumatoid arthritis ([place]) 07/04/2013  . Hypokalemia 07/03/2013  . Chest pain 07/03/2013  . Biventricular ICD in place (MDT 2014) 11/06/2012  . Anemia of chronic disease   . Breast cancer ([place])   . Hypertension   . Dyslipidemia 05/24/2009  . Chronic systolic  heart failure ([place]) 05/24/2009  . Primary osteoarthritis of both hands 08/11/2007    Palliative Care Assessment & Plan   HPI: 78 y.o. female  with past medical history of anemia, ASCVD, AICD placement, breast cancer, CKD 3, systolic heart failure, HLD, HTN, GERD, gout, RA, and impaired hearing admitted on 08/13/2018 with abdominal pain and decreased appetite.  Diagnosed with SBO and failed conservative management. Patient underwent surgical interventions with ex lap, lysis of adhesions, and bowel resection for ischemic bowel. She required ventilator and pressor support in ICU following surgery - these were discontinued 5/3. Since extubation she has had some issues with respiratory distress and prolonged postop ileus - she has required ongoing TNA. Surgical wound has dehisced. Per PT/OT eval - patient very weak and lethargic during sessions. Minimal PO intake. PMT consulted by surgery for GOC d/t poor PO intake, wound dehiscence, and guarded prognosis.   Recommendations/Plan:  Continue to treat  Husband remains hopeful  PMT will continue to support and follow as  needed   Code Status:  DNR  Prognosis:   Guarded to Poor   Discharge Planning:  To Be Determined  Thank you for allowing the Palliative Medicine Team to assist in the care of this patient.   Total Time 25 MIN. Prolonged Time Billed NO    The above conversation was completed via telephone due to the visitor restrictions during the COVID-19 pandemic. Thorough chart review and discussion with necessary members of the care team was completed as part of assessment. All issues were discussed and addressed but no physical exam was performed.     Greater than 50%  of this time was spent counseling and coordinating care related to the above assessment and plan.  Alda Lea, AGPCNP-BC Palliative Medicine Team  Phone: [phone] Pager: [phone] Amion: Bjorn Pippin

## 2018-09-18 NOTE — Progress Notes (Signed)
Occupational Therapy Treatment Patient Details Name: [name] MRN: [id number] DOB: 22-Feb-1941 Today's Date: 09/18/2018    History of present illness Pt is a 78 year old female with significant cardiac history that is post exploratory laparotomy for small bowel obstruction with associated ischemic bowel on 4/30, return to the intensive care on mechanical ventilator, critical care asked to assist with care.  VDRF 4/30-5/3.  Also PNA.  KTG:YBWLSLHTDS heart failure with EF 30%, nonsustained VT, pericardial effusion CAD w/ prior AICD, diastolic heart failure, CKD stage III, hypertension, rheumatoid arthritis, anemia of chronic disease.   OT comments  Pt making progress with functional goals. Co tx with PT for pt/therpaist safety, impaired endurance and cognition. Required multiple vebal cues to initiate sup - sit. Pt sat EOB x 10 minutes and ate 4 spofuls of magic cup and for simple grooming. Pt also stood at RW mod A +2 x 2 x 30 seconds for hygiene. Pt very pleasant and cooperative and OT will continue to follow acutely  Follow Up Recommendations  SNF;Supervision/Assistance - 24 hour    Equipment Recommendations  Other (comment)(TBD at next venue of care)    Recommendations for Other Services      Precautions / Restrictions Precautions Precautions: Fall Restrictions Weight Bearing Restrictions: No       Mobility Bed Mobility Overal bed mobility: Needs Assistance Bed Mobility: Supine to Sit;Sit to Supine     Supine to sit: Supervision;HOB elevated(used rails) Sit to supine: Min guard   General bed mobility comments: required multiple vebal cues to initiate sup - sit. Pt sat EOB x 10 minutes and ate 4 spofuls of magic cup and for simple grooming  Transfers Overall transfer level: Needs assistance Equipment used: Rolling walker (2 wheeled) Transfers: Sit to/from Stand Sit to Stand: Mod assist;+2 physical assistance;+2 safety/equipment         General transfer comment: Pt  stood at RW x 2 x 30 seconds for staff assisted hygiene after BM    Balance Overall balance assessment: Needs assistance Sitting-balance support: Feet supported Sitting balance-Leahy Scale: Fair Sitting balance - Comments: able to sit unassisted   Standing balance support: Bilateral upper extremity supported Standing balance-Leahy Scale: Poor                             ADL either performed or assessed with clinical judgement   ADL Overall ADL's : Needs assistance/impaired Eating/Feeding: Set up;Sitting Eating/Feeding Details (indicate cue type and reason): sitting EOB, pt ate 4 spoonfuls of magic cup Grooming: Wash/dry face;Sitting;Min guard;Wash/dry hands Grooming Details (indicate cue type and reason): sitting EOB         Upper Body Dressing : Minimal assistance;Sitting       Toilet Transfer: Moderate assistance;+2 for physical assistance;+2 for safety/equipment;RW;Stand-pivot;Cueing for safety;Cueing for sequencing Toilet Transfer Details (indicate cue type and reason): siumlated Toileting- Clothing Manipulation and Hygiene: Total assistance;Sit to/from stand Toileting - Clothing Manipulation Details (indicate cue type and reason): pt stood at RX x 2 x 30 seconds for hygiene after BM     Functional mobility during ADLs: +2 for physical assistance;+2 for safety/equipment;Moderate assistance;Cueing for safety;Rolling walker General ADL Comments: confused, generalized weakness     Vision Patient Visual Report: No change from baseline     Perception     Praxis      Cognition Arousal/Alertness: Awake/alert Behavior During Therapy: WFL for tasks assessed/performed Overall Cognitive Status: No family/caregiver present to determine baseline cognitive functioning Area of Impairment:  Following commands;Safety/judgement;Problem solving;Memory                 Orientation Level: Place;Situation   Memory: Decreased short-term memory Following Commands:  Follows one step commands inconsistently Safety/Judgement: Decreased awareness of safety;Decreased awareness of deficits   Problem Solving: Decreased initiation;Requires verbal cues;Requires tactile cues          Exercises     Shoulder Instructions       General Comments      Pertinent Vitals/ Pain       Pain Assessment: Faces Faces Pain Scale: Hurts a little bit Pain Location: generalized with movement Pain Descriptors / Indicators: Discomfort;Grimacing Pain Intervention(s): Limited activity within patient's tolerance;Monitored during session;Repositioned  Home Living                                          Prior Functioning/Environment              Frequency  Min 2X/week        Progress Toward Goals  OT Goals(current goals can now be found in the care plan section)  Progress towards OT goals: Progressing toward goals  Acute Rehab OT Goals Patient Stated Goal: none stated today  Plan Discharge plan remains appropriate    Co-evaluation      Reason for Co-Treatment: Complexity of the patient's impairments (multi-system involvement);For patient/therapist safety;To address functional/ADL transfers   OT goals addressed during session: ADL's and self-care;Proper use of Adaptive equipment and DME;Strengthening/ROM      AM-PAC OT "6 Clicks" Daily Activity     Outcome Measure   Help from another person eating meals?: A Little Help from another person taking care of personal grooming?: A Little Help from another person toileting, which includes using toliet, bedpan, or urinal?: Total Help from another person bathing (including washing, rinsing, drying)?: Total Help from another person to put on and taking off regular upper body clothing?: A Little Help from another person to put on and taking off regular lower body clothing?: Total 6 Click Score: 12    End of Session Equipment Utilized During Treatment: Gait belt;Rolling walker  OT  Visit Diagnosis: Unsteadiness on feet (R26.81);Other abnormalities of gait and mobility (R26.89);Muscle weakness (generalized) (M62.81);Other symptoms and signs involving cognitive function   Activity Tolerance Patient tolerated treatment well   Patient Left in bed;with call bell/phone within reach;with bed alarm set   Nurse Communication          Time: [phone] OT Time Calculation (min): 26 min  Charges: OT General Charges $OT Visit: 1 Visit OT Treatments $Self Care/Home Management : 8-22 mins     Britt Bottom 09/18/2018, 12:56 PM

## 2018-09-18 NOTE — Progress Notes (Signed)
[name]  PROGRESS NOTE    REMEDIOS MCKONE  NLG:[id number] DOB: [birth year] DOA: 08/13/2018 PCP: [name], MD   Brief Narrative:   Virda Betters [name] a 78 y.o.femalewith medical history significant ofanemia chronic disease, ASCVD, AICD placement, history of breast cancer, chronic bronchitis, stage III chronic renal disease, systolic heart failure, hyperlipidemia, GERD, history of GI bleed, gout, hypertension, rheumatoid arthritis, impaired hearing, history of UTI who is coming to the emergency department with complaints of abdominal pain apparently for the past 3 days associated with decreased appetite, several episodes of diarrhea, nausea, but no emesis. She denies fever, cough, sore throat, chest pain, dysuria, frequency, hematuria, melena or hematochezia. She denies polyuria, polydipsia, polyphagia or blurred vision. History is limited by the patient severe hearing impairment.  Interim history Patient was admitted with small bowel obstruction, failed conservative management and was transferred to [name]. General surgery consulted and appreciated, patient underwent ex lap with lysis of adhesions, bowel resection for ischemic bowel. She continued on the ventilator was put in ICU as she was also hypotensive and started on pressors. Patient was also shocked for VT. She developed progressive renal failure, oliguria. She was weaned off of pressors and extubated. And TRH assumed care. Started on antibiotics for possible pneumonia. Patient now with prolonged postop ileus, ongoing TNA, diet gradually being advanced per speech therapy. She was also noted to have a self-limiting GI bleed. Patient had acute respiratory distress on 09/01/2018 with decompensated CHF and upper airway wheezing. PCCM consulted.General surgery following.  She's had a long hospitalization after ex lap for small bowel obstruction with ischemic and necrotic bowel.  She had ex lap with small bowel  resection, lysis of adhessions, and appendectomy by Dr. Marlou Starks.  Hospitalization has been c/b aki, acute hypoxic respiratory failure and wound dehiscence.  Was receiving TPN via central line and now has contrak for nutrition.   Assessment & Plan:   Principal Problem:   CAP (community acquired pneumonia) Active Problems:   Dyslipidemia   Anemia of chronic disease   CAD S/P remote PCI- no details   Chronic combined systolic and diastolic CHF (congestive heart failure) (HCC)   AKI (acute kidney injury) (HCC)   SBO (small bowel obstruction) (HCC)   Pressure injury of skin   Acute renal failure superimposed on stage 3 chronic kidney disease (HCC)   Wide-complex tachycardia (HCC)   Protein-calorie malnutrition, severe   Acute respiratory failure with hypoxia (HCC)   Goals of care, counseling/discussion   Palliative care by specialist   Acute hypoxic respiratory failure likely multifactorial secondary to decompensated CHF and a component of bronchospasm     - Initially admitted for community-acquired pneumonia, completed her course of antibiotics     - PCCM consulted was briefly on BiPAP     - Responded well to IV Lasix and Solu-Medrol     - Continue bronchodilators and BiPAP as needed     - prednisone 40 mg daily; taper  Small bowel obstruction/ischemic colon Status post ex lap, appendectomy/Post op ileus/Dehiscence of Wound     - General surgery following     - Patient failed conservative management of bowel obstruction at [facility]     - Status post exploratory laparotomy for SBO and small bowel resection of ischemic necrotic bowel     - Completed 10 days of IV Zosyn     - Wound VAC removed on 08/27/2018     - Wound dressing as recommended by general surgery      -  No plans for any additional surgery.  Wound has dehisced.       - TPN discontinued.  Continue to encourage PO.       - Cortrak placed 5/26, now on tube feeds.     - Poor PO intake; don't know that she can keep up, may consider  LTACH  Hypernatremia     - Sodium as high as 156 on 09/10/2018; resolved  AKI on CKD 3     - Creatinine at baseline 1.5 with GFR of 38     - Creatinine peaked at 3.21     - resolved     - Follow UOP     - Continue to avoid nephrotoxic agents  Resolved septic shock     - Post VDRF and pressure support     - Extubated on 08/24/2018     - Weaned off pressors on 08/23/2018  Leukocytosis likely reactive from steroid use versus recent surgery     - DC IV Solu-Medrol 40 mg daily and start prednisone 40 mg daily on 09/14/2018 - will need taper     - WBC stable, afebrile, No sign of active infective process  Elevated LFTs     - Alkaline phosphatase, AST and ALT elevated; but stable  Non Anion Gap Metabolic Acidosis     - follow off IVF, hyperchloremic; improving  Physical debility/ambulatory dysfunction     - PT OT recommended SNF     - Fall precautions  Goals of care:      - Palliative care following, now DNR.  Likely needs LTACH. Poor PO intake.   DVT prophylaxis: heparin Code Status: DNR   Disposition Plan: TBD   Consultants:   General Surgery  Cardiology  Palliative Care    Subjective: Per nursing, no acute events ON.   Objective: Vitals:   09/18/18 0346 09/18/18 0822 09/18/18 1215 09/18/18 1608  BP: (!) 128/48 (!) 121/45 (!) 125/49 (!) 120/49  Pulse: 73 87    Resp: 20 20    Temp: 98.1 F (36.7 C) 98.4 F (36.9 C) 97.9 F (36.6 C) 97.9 F (36.6 C)  TempSrc: Oral Oral Oral Oral  SpO2: 100% 100%    Weight: 64.5 kg     Height:        Intake/Output Summary (Last 24 hours) at 09/18/2018 1700 Last data filed at 09/18/2018 0700 Gross per 24 hour  Intake 830 ml  Output --  Net 830 ml   Filed Weights   09/12/18 0251 09/17/18 0420 09/18/18 0346  Weight: 61.7 kg 67.7 kg 64.5 kg    Examination:  General exam: 78 y.o. female Appears calm and comfortable  Respiratory system: Clear to auscultation. Respiratory effort normal. Cardiovascular system:  S1 & S2 heard, RRR. No JVD, murmurs, rubs, gallops or clicks. No pedal edema. Gastrointestinal system: BS+, surgical dressing CDI, soft, non-tender Central nervous system: Alert and oriented. No focal neurological deficits. She is hard of hearing Extremities: Symmetric 5 x 5 power.    Data Reviewed: I have personally reviewed following labs and imaging studies.  CBC: Recent Labs  Lab 09/14/18 0906 09/15/18 0459 09/16/18 0325 09/17/18 0230 09/18/18 0318  WBC 12.1* 13.0* 12.2* 12.1* 12.2*  NEUTROABS 9.6* 12.2*  --   --  10.3*  HGB 8.2* 8.4* 7.9* 8.1* 7.8*  HCT 27.7* 27.5* 26.5* 27.2* 25.4*  MCV 93.6 93.2 92.3 93.5 92.0  PLT 190 193 189 189 295   Basic Metabolic Panel: Recent Labs  Lab 09/12/18 0236  09/14/18  4193 09/15/18 0459 09/15/18 1436 09/16/18 0325 09/17/18 0230 09/18/18 0318  NA 145   < > 145 145  --  145 141 142  K 4.4   < > 3.9 5.5* 5.1 5.0 5.4* 4.8  CL 108   < > 112* 115*  --  114* 110 112*  CO2 27   < > 23 17*  --  20* 21* 21*  GLUCOSE 94   < > 129* 90  --  124* 176* 157*  BUN 110*   < > 85* 88*  --  69* 66* 68*  CREATININE 1.71*   < > 1.62* 1.49*  --  1.41* 1.42* 1.27*  CALCIUM 8.4*   < > 8.5* 8.5*  --  8.5* 8.1* 8.0*  MG 2.2  --   --  2.2  --  1.9 1.7 1.8  PHOS  --   --   --  3.7  --   --  3.0 2.7   < > = values in this interval not displayed.   GFR: Estimated Creatinine Clearance: 30.5 mL/min (A) (by C-G formula based on SCr of 1.27 mg/dL (H)). Liver Function Tests: Recent Labs  Lab 09/12/18 0236 09/15/18 0459 09/16/18 0325 09/17/18 0230 09/18/18 0318  AST 42* 60* 63* 59* 48*  ALT 56* 75* 78* 83* 81*  ALKPHOS 151* 150* 159* 162* 166*  BILITOT 4.2* 4.6* 4.2* 4.2* 3.3*  PROT 5.4* 5.5* 5.1* 5.1* 5.0*  ALBUMIN 1.7* 1.7* 1.7* 1.6* 1.6*   No results for input(s): LIPASE, AMYLASE in the last 168 hours. No results for input(s): AMMONIA in the last 168 hours. Coagulation Profile: No results for input(s): INR, PROTIME in the last 168 hours. Cardiac  Enzymes: No results for input(s): CKTOTAL, CKMB, CKMBINDEX, TROPONINI in the last 168 hours. BNP (last 3 results) No results for input(s): PROBNP in the last 8760 hours. HbA1C: No results for input(s): HGBA1C in the last 72 hours. CBG: Recent Labs  Lab 09/17/18 2336 09/18/18 0344 09/18/18 0818 09/18/18 1214 09/18/18 1607  GLUCAP 176* 141* 142* 164* 272*   Lipid Profile: No results for input(s): CHOL, HDL, LDLCALC, TRIG, CHOLHDL, LDLDIRECT in the last 72 hours. Thyroid Function Tests: No results for input(s): TSH, T4TOTAL, FREET4, T3FREE, THYROIDAB in the last 72 hours. Anemia Panel: No results for input(s): VITAMINB12, FOLATE, FERRITIN, TIBC, IRON, RETICCTPCT in the last 72 hours. Sepsis Labs: No results for input(s): PROCALCITON, LATICACIDVEN in the last 168 hours.  Recent Results (from the past 240 hour(s))  Culture, blood (routine x 2)     Status: None   Collection Time: 09/09/18  7:22 AM  Result Value Ref Range Status   Specimen Description BLOOD RIGHT ANTECUBITAL  Final   Special Requests   Final    BOTTLES DRAWN AEROBIC ONLY Blood Culture results may not be optimal due to an inadequate volume of blood received in culture bottles   Culture   Final    NO GROWTH 5 DAYS Performed at [facility], [place] [address] [place], [place] [address]    Report Status 09/14/2018 FINAL  Final  Culture, blood (routine x 2)     Status: None   Collection Time: 09/09/18  7:28 AM  Result Value Ref Range Status   Specimen Description BLOOD RIGHT HAND  Final   Special Requests   Final    BOTTLES DRAWN AEROBIC ONLY Blood Culture adequate volume   Culture   Final    NO GROWTH 5 DAYS Performed at [facility]  Lab, [address], [place], [place] [address]    Report Status 09/14/2018 FINAL  Final  Culture, Urine     Status: Abnormal   Collection Time: 09/09/18 10:30 AM  Result Value Ref Range Status   Specimen Description URINE, CLEAN CATCH  Final   Special Requests   Final     NONE Performed at [facility], [place] [address] [place], [place] [address]    Culture (A)  Final    30,000 COLONIES/mL ESCHERICHIA COLI Confirmed Extended Spectrum Beta-Lactamase Producer (ESBL).  In bloodstream infections from ESBL organisms, carbapenems are preferred over piperacillin/tazobactam. They are shown to have a lower risk of mortality.    Report Status 09/12/2018 FINAL  Final   Organism ID, Bacteria ESCHERICHIA COLI (A)  Final      Susceptibility   Escherichia coli - MIC*    AMPICILLIN >=32 RESISTANT Resistant     CEFAZOLIN >=64 RESISTANT Resistant     CEFTRIAXONE >=64 RESISTANT Resistant     CIPROFLOXACIN >=4 RESISTANT Resistant     GENTAMICIN >=16 RESISTANT Resistant     IMIPENEM <=0.25 SENSITIVE Sensitive     NITROFURANTOIN <=16 SENSITIVE Sensitive     TRIMETH/SULFA <=20 SENSITIVE Sensitive     AMPICILLIN/SULBACTAM >=32 RESISTANT Resistant     PIP/TAZO 64 INTERMEDIATE Intermediate     Extended ESBL POSITIVE Resistant     * 30,000 COLONIES/mL ESCHERICHIA COLI         Radiology Studies: No results found.      Scheduled Meds:  amiodarone  200 mg Oral Daily   carvedilol  3.125 mg Oral BID WC   chlorhexidine  15 mL Mouth Rinse BID   feeding supplement (PRO-STAT SUGAR FREE 64)  30 mL Per Tube BID   heparin injection (subcutaneous)  5,000 Units Subcutaneous Q8H   insulin aspart  0-20 Units Subcutaneous Q4H   mouth rinse  15 mL Mouth Rinse q12n4p   mupirocin ointment   Nasal BID   pantoprazole  40 mg Oral Daily   predniSONE  40 mg Oral Q breakfast   saccharomyces boulardii  250 mg Oral BID   Continuous Infusions:  feeding supplement (OSMOLITE 1.5 CAL) 1,000 mL (09/18/18 0423)     LOS: 36 days    Time spent: 25 minutes spent in the coordination of care today.     Jonnie Finner, DO [facility] Pager [phone]  If 7PM-7AM, please contact night-coverage [email or web address] Password [email or web address] 09/18/2018, 5:00 PM

## 2018-09-18 NOTE — Progress Notes (Signed)
Physical Therapy Treatment Patient Details Name: [name] MRN: [id number] DOB: 1940/10/09 Today's Date: 09/18/2018    History of Present Illness Pt is a 78 year old female with significant cardiac history that is post exploratory laparotomy for small bowel obstruction with associated ischemic bowel on 4/30, return to the intensive care on mechanical ventilator, critical care asked to assist with care.  VDRF 4/30-5/3.  Also PNA.  ION:[id number] heart failure with EF 30%, nonsustained VT, pericardial effusion CAD w/ prior AICD, diastolic heart failure, CKD stage III, hypertension, rheumatoid arthritis, anemia of chronic disease.    PT Comments    Pt much more agreeable to therapy today. Pt able to come to sitting EoB with verbal cuing and min guard for safety. Sat EoB and ate some Magic Cup, afterward agreeable to standing to RW. Requires modAx2 for power up and steadying but able to stand twice for 30 sec for pericare and clean pad placement. D/c plans remain appropriate. PT will continue to follow acutely.  Follow Up Recommendations  SNF;Supervision/Assistance - 24 hour     Equipment Recommendations  None recommended by PT       Precautions / Restrictions Precautions Precautions: Fall Restrictions Weight Bearing Restrictions: No    Mobility  Bed Mobility Overal bed mobility: Needs Assistance Bed Mobility: Supine to Sit;Sit to Supine     Supine to sit: Supervision;HOB elevated(used rails) Sit to supine: Min guard   General bed mobility comments: required multiple vebal cues to initiate sup - sit. Pt sat EOB x 10 minutes and ate 4 spofuls of magic cup and for simple grooming  Transfers Overall transfer level: Needs assistance Equipment used: Rolling walker (2 wheeled) Transfers: Sit to/from Stand Sit to Stand: Mod assist;+2 physical assistance;+2 safety/equipment         General transfer comment: Pt stood at RW x 2 x 30 seconds for staff assisted hygiene after BM     Balance Overall balance assessment: Needs assistance Sitting-balance support: Feet supported Sitting balance-Leahy Scale: Fair Sitting balance - Comments: able to sit unassisted   Standing balance support: Bilateral upper extremity supported Standing balance-Leahy Scale: Poor                              Cognition Arousal/Alertness: Awake/alert Behavior During Therapy: WFL for tasks assessed/performed Overall Cognitive Status: No family/caregiver present to determine baseline cognitive functioning Area of Impairment: Following commands;Safety/judgement;Problem solving;Memory                 Orientation Level: Place;Situation   Memory: Decreased short-term memory Following Commands: Follows one step commands inconsistently Safety/Judgement: Decreased awareness of safety;Decreased awareness of deficits   Problem Solving: Decreased initiation;Requires verbal cues;Requires tactile cues               Pertinent Vitals/Pain Pain Assessment: Faces Faces Pain Scale: Hurts a little bit Pain Location: generalized with movement Pain Descriptors / Indicators: Discomfort;Grimacing Pain Intervention(s): Limited activity within patient's tolerance;Monitored during session;Repositioned           PT Goals (current goals can now be found in the care plan section) Acute Rehab PT Goals Patient Stated Goal: none stated today PT Goal Formulation: With patient Time For Goal Achievement: 09/08/18 Potential to Achieve Goals: Good Progress towards PT goals: Progressing toward goals    Frequency    Min 2X/week      PT Plan Current plan remains appropriate    Co-evaluation PT/OT/SLP Co-Evaluation/Treatment: Yes Reason for Co-Treatment: Necessary  to address cognition/behavior during functional activity PT goals addressed during session: Mobility/safety with mobility;Proper use of DME OT goals addressed during session: ADL's and self-care;Proper use of Adaptive  equipment and DME;Strengthening/ROM      AM-PAC PT "6 Clicks" Mobility   Outcome Measure  Help needed turning from your back to your side while in a flat bed without using bedrails?: None Help needed moving from lying on your back to sitting on the side of a flat bed without using bedrails?: A Little Help needed moving to and from a bed to a chair (including a wheelchair)?: Total Help needed standing up from a chair using your arms (e.g., wheelchair or bedside chair)?: A Lot Help needed to walk in hospital room?: Total Help needed climbing 3-5 steps with a railing? : Total 6 Click Score: 12    End of Session Equipment Utilized During Treatment: Gait belt Activity Tolerance: Patient limited by pain;Patient limited by fatigue;Patient limited by lethargy Patient left: in bed;with call bell/phone within reach;with bed alarm set;with SCD's reapplied Nurse Communication: Mobility status PT Visit Diagnosis: Unsteadiness on feet (R26.81);Muscle weakness (generalized) (M62.81)     Time: 2703-5009 PT Time Calculation (min) (ACUTE ONLY): 25 min  Charges:  $Therapeutic Activity: 8-22 mins                     Zohal Reny B. Migdalia Dk PT, DPT Acute Rehabilitation Services Pager [phone] Office [phone]    [place] 09/18/2018, 2:31 PM

## 2018-09-19 ENCOUNTER — Other Ambulatory Visit (HOSPITAL_COMMUNITY): Payer: Medicare Other

## 2018-09-19 ENCOUNTER — Inpatient Hospital Stay
Admission: RE | Admit: 2018-09-19 | Discharge: 2018-10-25 | Disposition: A | Discharge status: Home Health | Payer: Medicare Other | Attending: Internal Medicine | Admitting: Internal Medicine

## 2018-09-19 DIAGNOSIS — D72829 Elevated white blood cell count, unspecified: Secondary | ICD-10-CM | POA: Diagnosis not present

## 2018-09-19 DIAGNOSIS — N183 Chronic kidney disease, stage 3 (moderate): Secondary | ICD-10-CM | POA: Diagnosis not present

## 2018-09-19 DIAGNOSIS — K56609 Unspecified intestinal obstruction, unspecified as to partial versus complete obstruction: Secondary | ICD-10-CM | POA: Diagnosis not present

## 2018-09-19 DIAGNOSIS — E870 Hyperosmolality and hypernatremia: Secondary | ICD-10-CM | POA: Diagnosis not present

## 2018-09-19 DIAGNOSIS — I96 Gangrene, not elsewhere classified: Secondary | ICD-10-CM | POA: Diagnosis not present

## 2018-09-19 DIAGNOSIS — K9189 Other postprocedural complications and disorders of digestive system: Secondary | ICD-10-CM | POA: Diagnosis not present

## 2018-09-19 DIAGNOSIS — J9601 Acute respiratory failure with hypoxia: Secondary | ICD-10-CM | POA: Diagnosis not present

## 2018-09-19 DIAGNOSIS — Z9012 Acquired absence of left breast and nipple: Secondary | ICD-10-CM | POA: Diagnosis not present

## 2018-09-19 DIAGNOSIS — Z743 Need for continuous supervision: Secondary | ICD-10-CM | POA: Diagnosis not present

## 2018-09-19 DIAGNOSIS — K219 Gastro-esophageal reflux disease without esophagitis: Secondary | ICD-10-CM | POA: Diagnosis not present

## 2018-09-19 DIAGNOSIS — Z853 Personal history of malignant neoplasm of breast: Secondary | ICD-10-CM | POA: Diagnosis not present

## 2018-09-19 DIAGNOSIS — R1310 Dysphagia, unspecified: Secondary | ICD-10-CM | POA: Diagnosis not present

## 2018-09-19 DIAGNOSIS — K5900 Constipation, unspecified: Secondary | ICD-10-CM

## 2018-09-19 DIAGNOSIS — E7800 Pure hypercholesterolemia, unspecified: Secondary | ICD-10-CM | POA: Diagnosis not present

## 2018-09-19 DIAGNOSIS — I10 Essential (primary) hypertension: Secondary | ICD-10-CM | POA: Diagnosis not present

## 2018-09-19 DIAGNOSIS — R279 Unspecified lack of coordination: Secondary | ICD-10-CM | POA: Diagnosis not present

## 2018-09-19 DIAGNOSIS — J90 Pleural effusion, not elsewhere classified: Secondary | ICD-10-CM | POA: Diagnosis not present

## 2018-09-19 DIAGNOSIS — E43 Unspecified severe protein-calorie malnutrition: Secondary | ICD-10-CM | POA: Diagnosis not present

## 2018-09-19 DIAGNOSIS — J189 Pneumonia, unspecified organism: Secondary | ICD-10-CM

## 2018-09-19 DIAGNOSIS — J9600 Acute respiratory failure, unspecified whether with hypoxia or hypercapnia: Secondary | ICD-10-CM | POA: Diagnosis not present

## 2018-09-19 DIAGNOSIS — D631 Anemia in chronic kidney disease: Secondary | ICD-10-CM | POA: Diagnosis not present

## 2018-09-19 DIAGNOSIS — N390 Urinary tract infection, site not specified: Secondary | ICD-10-CM | POA: Diagnosis not present

## 2018-09-19 DIAGNOSIS — I130 Hypertensive heart and chronic kidney disease with heart failure and stage 1 through stage 4 chronic kidney disease, or unspecified chronic kidney disease: Secondary | ICD-10-CM | POA: Diagnosis not present

## 2018-09-19 DIAGNOSIS — R531 Weakness: Secondary | ICD-10-CM | POA: Diagnosis not present

## 2018-09-19 DIAGNOSIS — R748 Abnormal levels of other serum enzymes: Secondary | ICD-10-CM | POA: Diagnosis not present

## 2018-09-19 DIAGNOSIS — H9190 Unspecified hearing loss, unspecified ear: Secondary | ICD-10-CM | POA: Diagnosis not present

## 2018-09-19 DIAGNOSIS — I2510 Atherosclerotic heart disease of native coronary artery without angina pectoris: Secondary | ICD-10-CM | POA: Diagnosis not present

## 2018-09-19 DIAGNOSIS — T8131XA Disruption of external operation (surgical) wound, not elsewhere classified, initial encounter: Secondary | ICD-10-CM | POA: Diagnosis not present

## 2018-09-19 DIAGNOSIS — Z4682 Encounter for fitting and adjustment of non-vascular catheter: Secondary | ICD-10-CM | POA: Diagnosis not present

## 2018-09-19 DIAGNOSIS — D638 Anemia in other chronic diseases classified elsewhere: Secondary | ICD-10-CM | POA: Diagnosis not present

## 2018-09-19 DIAGNOSIS — J9811 Atelectasis: Secondary | ICD-10-CM | POA: Diagnosis not present

## 2018-09-19 DIAGNOSIS — I129 Hypertensive chronic kidney disease with stage 1 through stage 4 chronic kidney disease, or unspecified chronic kidney disease: Secondary | ICD-10-CM | POA: Diagnosis not present

## 2018-09-19 DIAGNOSIS — E860 Dehydration: Secondary | ICD-10-CM | POA: Diagnosis not present

## 2018-09-19 DIAGNOSIS — I509 Heart failure, unspecified: Secondary | ICD-10-CM | POA: Diagnosis not present

## 2018-09-19 DIAGNOSIS — N179 Acute kidney failure, unspecified: Secondary | ICD-10-CM | POA: Diagnosis not present

## 2018-09-19 DIAGNOSIS — B961 Klebsiella pneumoniae [K. pneumoniae] as the cause of diseases classified elsewhere: Secondary | ICD-10-CM | POA: Diagnosis not present

## 2018-09-19 DIAGNOSIS — K567 Ileus, unspecified: Secondary | ICD-10-CM | POA: Diagnosis not present

## 2018-09-19 LAB — GLUCOSE, CAPILLARY
Glucose-Capillary: 121 mg/dL — ABNORMAL HIGH (ref 70–99)
Glucose-Capillary: 211 mg/dL — ABNORMAL HIGH (ref 70–99)
Glucose-Capillary: 155 mg/dL — ABNORMAL HIGH (ref 70–99)
Glucose-Capillary: 205 mg/dL — ABNORMAL HIGH (ref 70–99)

## 2018-09-19 LAB — RENAL FUNCTION PANEL
Albumin: 1.6 g/dL — ABNORMAL LOW (ref 3.5–5.0)
Anion gap: 10 (ref 5–15)
BUN: 68 mg/dL — ABNORMAL HIGH (ref 8–23)
CO2: 23 mmol/L (ref 22–32)
Calcium: 8.4 mg/dL — ABNORMAL LOW (ref 8.9–10.3)
Chloride: 113 mmol/L — ABNORMAL HIGH (ref 98–111)
Creatinine, Ser: 1.22 mg/dL — ABNORMAL HIGH (ref 0.44–1.00)
GFR calc Af Amer: 48 mL/min — ABNORMAL LOW (ref >60)
GFR calc non Af Amer: 42 mL/min — ABNORMAL LOW (ref >60)
Glucose, Bld: 193 mg/dL — ABNORMAL HIGH (ref 70–99)
Phosphorus: 3.2 mg/dL (ref 2.5–4.6)
Potassium: 4.7 mmol/L (ref 3.5–5.1)
Sodium: 146 mmol/L — ABNORMAL HIGH (ref 135–145)

## 2018-09-19 LAB — MAGNESIUM: Magnesium: 1.8 mg/dL (ref 1.7–2.4)

## 2018-09-19 MED ORDER — INSULIN ASPART 100 UNIT/ML ~~LOC~~ SOLN: 0.0000 [IU] | SUBCUTANEOUS | 11 refills | Status: DC

## 2018-09-19 MED ORDER — ONDANSETRON HCL 4 MG/2ML IJ SOLN: 4.0000 mg | Freq: Four times a day (QID) | INTRAMUSCULAR | 0 refills | Status: DC | PRN

## 2018-09-19 MED ORDER — PREDNISONE 20 MG PO TABS: 40.0000 mg | ORAL_TABLET | Freq: Every day | ORAL | Status: DC

## 2018-09-19 MED ORDER — SACCHAROMYCES BOULARDII 250 MG PO CAPS: 250.0000 mg | ORAL_CAPSULE | Freq: Two times a day (BID) | ORAL | Status: DC

## 2018-09-19 MED ORDER — AMIODARONE HCL 200 MG PO TABS: 200.0000 mg | ORAL_TABLET | Freq: Every day | ORAL | Status: DC

## 2018-09-19 MED ORDER — ONDANSETRON 4 MG PO TBDP: 4.0000 mg | ORAL_TABLET | Freq: Four times a day (QID) | ORAL | 0 refills | Status: DC | PRN

## 2018-09-19 MED ORDER — PANTOPRAZOLE SODIUM 40 MG PO TBEC: 40.0000 mg | DELAYED_RELEASE_TABLET | Freq: Every day | ORAL | Status: DC

## 2018-09-19 MED ORDER — OXYCODONE HCL 5 MG PO TABS: 5.0000 mg | ORAL_TABLET | ORAL | 0 refills | Status: DC | PRN

## 2018-09-19 MED ORDER — ORAL CARE MOUTH RINSE: 15.0000 mL | Freq: Two times a day (BID) | OROMUCOSAL | 0 refills | Status: DC

## 2018-09-19 MED ORDER — MUPIROCIN 2 % EX OINT: TOPICAL_OINTMENT | Freq: Two times a day (BID) | CUTANEOUS | 0 refills | Status: DC

## 2018-09-19 MED ORDER — PRO-STAT SUGAR FREE PO LIQD: 30.0000 mL | Freq: Two times a day (BID) | ORAL | 0 refills | Status: DC

## 2018-09-19 MED ORDER — CHLORHEXIDINE GLUCONATE 0.12 % MT SOLN: 15.0000 mL | Freq: Two times a day (BID) | OROMUCOSAL | 0 refills | Status: DC

## 2018-09-19 MED ORDER — ALBUTEROL SULFATE (2.5 MG/3ML) 0.083% IN NEBU: 2.5000 mg | INHALATION_SOLUTION | RESPIRATORY_TRACT | 12 refills | Status: DC | PRN

## 2018-09-19 MED ORDER — ACETAMINOPHEN 650 MG RE SUPP: 650.0000 mg | Freq: Four times a day (QID) | RECTAL | 0 refills | Status: DC | PRN

## 2018-09-19 MED ORDER — CARVEDILOL 3.125 MG PO TABS: 3.1250 mg | ORAL_TABLET | Freq: Two times a day (BID) | ORAL | Status: DC

## 2018-09-19 MED ORDER — OSMOLITE 1.5 CAL PO LIQD: 1000.0000 mL | ORAL | 0 refills | Status: DC

## 2018-09-19 MED ORDER — ACETAMINOPHEN 325 MG PO TABS: 650.0000 mg | ORAL_TABLET | Freq: Four times a day (QID) | ORAL | Status: DC | PRN

## 2018-09-19 NOTE — Progress Notes (Signed)
Nutrition Follow-up  DOCUMENTATION CODES:   Severe malnutrition in context of acute illness/injury  INTERVENTION:   Continue tube feeding: -Osmolite 1.5 @ 55 ml/hr (1320 ml) via Cortrak  -30 ml Prostat BID At goal TF provides: 2180 kcal, 113 grams protein, and 1006 ml free water. Meets 100% of needs.   -Continue Magic Cup TID with meals, each supplement provides 290 kcals and 9 grams protein -Continue feeding assistance with meals  NUTRITION DIAGNOSIS:   Severe Malnutrition related to acute illness(SBO) as evidenced by energy intake < or equal to 50% for > or equal to 5 days, mild fat depletion, moderate muscle depletion.  Ongoing  GOAL:   Patient will meet greater than or equal to 90% of their needs  Met via TF  MONITOR:   Diet advancement, Weight trends, Labs, Vent status, TF tolerance, Skin, I & O's  REASON FOR ASSESSMENT:   Consult Calorie Count  ASSESSMENT:   Patient with PMH significant for anemia, CAD s/p AICD placement, breast cancer, CKD III, CHF, HLD, impaired hearing, and RA. Presented to [facility] on 4/23 with CAP and SBO.    4/28- CT scan showed persistent SBO, tx to [facility] for surgery 4/30- ex lap, lysis of adhesions, small bowel resection due to ischemic changes 5/3- extubated 5/4- TPN started  5/5- NGT removed  5/9- diet advanced DYS 1, thin liquids 5/25- TPN off 5/26- Cortrak placed, TF started   Pt unable to answer RD questions at bedside. Spoke with RN who reports pt is tolerating TF at goal. She is not taking much by mouth. Tech states she refused breakfast this am. No meal completions charted for the last two days.   Plan for pt to discharge to [place] with Cortrak. Recommend continuation of TF regimen once discharged.   Of note pt looks to have MASD on her nose. Unsure if this is device related (bridle). Will discuss with RN.   Weight noted to decrease from 61.7 kg on 5/22 to 60.7 kg today.   Medications: amiodarone, SS novolog, prednisone Labs:  Na 146 (H) LFTs elevated CBG 121-272  Diet Order:   Diet Order            DIET - DYS 1 Room service appropriate? No; Fluid consistency: Thin  Diet effective now              EDUCATION NEEDS:   Not appropriate for education at this time  Skin:  Skin Assessment: Skin Integrity Issues: Skin Integrity Issues:: Other (Comment), Stage II, Incisions Stage II: buttocks, coccyx Incisions: dehisced abdomen, closed rt arm Other: MASD medial bilateral buttocks; open wound to rt lateral arm, right nose (device related?)  Last BM:  5/28  Height:   Ht Readings from Last 1 Encounters:  08/21/18 '5\' 4"'  (1.626 m)    Weight:   Wt Readings from Last 1 Encounters:  09/19/18 60.7 kg    Ideal Body Weight:  54.5 kg  BMI:  Body mass index is 22.97 kg/m.  Estimated Nutritional Needs:   Kcal:  2000-2200 kcal  Protein:  100-120 grams  Fluid:  >/= 2 L/day    Mariana Single RD, LDN Clinical Nutrition Pager # - [phone]

## 2018-09-19 NOTE — Discharge Summary (Signed)
. Physician Discharge Summary  [name] WIO:[id number] DOB: [birth year] DOA: 08/13/2018  PCP: [name], MD  Admit date: 08/13/2018 Discharge date: 09/19/2018  Admitted From: Home Disposition:  Transferred to [facility] Discharge Condition: Stable  CODE STATUS: DNR   Brief/Interim Summary: Savannah Peak [name] a 78 y.o.femalewith medical history significant ofanemia chronic disease, ASCVD, AICD placement, history of breast cancer, chronic bronchitis, stage III chronic renal disease, systolic heart failure, hyperlipidemia, GERD, history of GI bleed, gout, hypertension, rheumatoid arthritis, impaired hearing, history of UTI who is coming to the emergency department with complaints of abdominal pain apparently for the past 3 days associated with decreased appetite, several episodes of diarrhea, nausea, but no emesis. She denies fever, cough, sore throat, chest pain, dysuria, frequency, hematuria, melena or hematochezia. She denies polyuria, polydipsia, polyphagia or blurred vision. History is limited by the patient severe hearing impairment.  Interim history Patient was admitted with small bowel obstruction, failed conservative management and was transferred to [name]. General surgery consulted and appreciated, patient underwent ex lap with lysis of adhesions, bowel resection for ischemic bowel. She continued on the ventilator was put in ICU as she was also hypotensive and started on pressors. Patient was also shocked for VT. She developed progressive renal failure, oliguria. She was weaned off of pressors and extubated. And TRH assumed care. Started on antibiotics for possible pneumonia. Patient now with prolonged postop ileus, ongoing TNA, diet gradually being advanced per speech therapy. She was also noted to have a self-limiting GI bleed. Patient had acute respiratory distress on 09/01/2018 with decompensated CHF and upper airway wheezing. PCCM  consulted.General surgery following.  She's had a long hospitalization after ex lap for small bowel obstruction with ischemic and necrotic bowel. She had ex lap with small bowel resection, lysis of adhessions, and appendectomy by Dr. Marlou Starks. Hospitalization has been c/b aki, acute hypoxic respiratory failure and wound dehiscence. Was receiving TPN via central line and now has contrak for nutrition.  Discharge Diagnoses:  Principal Problem:   CAP (community acquired pneumonia) Active Problems:   Dyslipidemia   Anemia of chronic disease   CAD S/P remote PCI- no details   Chronic combined systolic and diastolic CHF (congestive heart failure) (HCC)   AKI (acute kidney injury) (HCC)   SBO (small bowel obstruction) (HCC)   Pressure injury of skin   Acute renal failure superimposed on stage 3 chronic kidney disease (HCC)   Wide-complex tachycardia (HCC)   Protein-calorie malnutrition, severe   Acute respiratory failure with hypoxia (HCC)   Goals of care, counseling/discussion   Palliative care by specialist  Acute hypoxic respiratory failure likely multifactorial secondary to decompensated CHF and a component of bronchospasm - Initially admitted for community-acquired pneumonia, completed her course of antibiotics - PCCM consulted was briefly on BiPAP - Responded well to IV Lasix and Solu-Medrol - Continue bronchodilators and BiPAP as needed - prednisone 40 mg daily; taper  Small bowel obstruction/ischemic colon Status post ex lap, appendectomy/Post op ileus/Dehiscence of Wound - General surgery following - Patient failed conservative management of bowel obstruction at [facility] - Status post exploratory laparotomy for SBO and small bowel resection of ischemic necrotic bowel - Completed 10 days of IV Zosyn - Wound VAC removed on 08/27/2018 - Wound dressing as recommended by general surgery  - No plans for any additional surgery. Wound has  dehisced.  - TPN discontinued. Continue to encourage PO.  - Cortrak placed 5/26, now on tube feeds.     -  Poor PO intake; don't know that she can keep up, may consider LTACH     - Nutrition: Continue tube feeding:         - Osmolite 1.5 @ 55 ml/hr (1320 ml) via Cortrak          - 30 ml Prostat BID         - At goal TF provides: 2180 kcal, 113 grams protein, and 1006 ml free water. Meets 100% of needs.          - Continue Magic Cup TID with meals, each supplement provides 290 kcals and 9 grams protein         - Continue feeding assistance with meals  Hypernatremia - Sodium as high as 156 on 09/10/2018; resolved  AKI on CKD 3 - Creatinine at baseline 1.5 with GFR of 38 - Creatinine peaked at 3.21 - resolved - Follow UOP - Continue to avoid nephrotoxic agents  Resolved septic shock - Post VDRF and pressure support - Extubated on 08/24/2018 - Weaned off pressors on 08/23/2018  Leukocytosis likely reactive from steroid use versus recent surgery - DC IV Solu-Medrol 40 mg daily and start prednisone 40 mg daily on 09/14/2018 - will need taper - WBC stable, afebrile, No sign of active infective process  Elevated LFTs - Alkaline phosphatase, AST and ALT elevated; but stable  Non Anion Gap Metabolic Acidosis - follow off IVF, hyperchloremic; improving  Physical debility/ambulatory dysfunction - PT OT recommended SNF - Fall precautions  Goals of care:  - Palliative care following, now DNR.   Allergies as of 09/19/2018      Reactions   Macrobid [nitrofurantoin Macrocrystal] Hives, Itching      Medication List    STOP taking these medications   allopurinol 100 MG tablet Commonly known as:  ZYLOPRIM   Colcrys 0.6 MG tablet Generic drug:  colchicine   furosemide 40 MG tablet Commonly known as:  LASIX   hydroxychloroquine 200 MG tablet Commonly known as:  PLAQUENIL   Klor-Con M20 20 MEQ  tablet Generic drug:  potassium chloride SA   lovastatin 40 MG tablet Commonly known as:  MEVACOR   Magnesium 500 MG Caps     TAKE these medications   acetaminophen 500 MG tablet Commonly known as:  TYLENOL Take 500 mg by mouth every 6 (six) hours as needed (FOR PAIN/HEADACHES). What changed:  Another medication with the same name was added. Make sure you understand how and when to take each.   acetaminophen 325 MG tablet Commonly known as:  TYLENOL Take 2 tablets (650 mg total) by mouth every 6 (six) hours as needed for mild pain (or Fever >/= 101). What changed:  You were already taking a medication with the same name, and this prescription was added. Make sure you understand how and when to take each.   acetaminophen 650 MG suppository Commonly known as:  TYLENOL Place 1 suppository (650 mg total) rectally every 6 (six) hours as needed for mild pain (or Fever >/= 101). What changed:  You were already taking a medication with the same name, and this prescription was added. Make sure you understand how and when to take each.   albuterol (2.5 MG/3ML) 0.083% nebulizer solution Commonly known as:  PROVENTIL Take 3 mLs (2.5 mg total) by nebulization every 4 (four) hours as needed for wheezing or shortness of breath.   amiodarone 200 MG tablet Commonly known as:  PACERONE Take 1 tablet (200 mg total) by mouth daily. Start taking  on:  Sep 20, 2018   carvedilol 3.125 MG tablet Commonly known as:  COREG Take 1 tablet (3.125 mg total) by mouth 2 (two) times daily with a meal. What changed:    medication strength  how much to take   chlorhexidine 0.12 % solution Commonly known as:  PERIDEX 15 mLs by Mouth Rinse route 2 (two) times daily.   feeding supplement (OSMOLITE 1.5 CAL) Liqd Place 1,000 mLs into feeding tube continuous.   feeding supplement (PRO-STAT SUGAR FREE 64) Liqd Place 30 mLs into feeding tube 2 (two) times daily.   insulin aspart 100 UNIT/ML  injection Commonly known as:  novoLOG Inject 0-20 Units into the skin every 4 (four) hours.   mouth rinse Liqd solution 15 mLs by Mouth Rinse route 2 times daily at 12 noon and 4 pm.   mupirocin ointment 2 % Commonly known as:  BACTROBAN Place into the nose 2 (two) times daily.   ondansetron 4 MG disintegrating tablet Commonly known as:  ZOFRAN-ODT Take 1 tablet (4 mg total) by mouth every 6 (six) hours as needed for nausea.   ondansetron 4 MG/2ML Soln injection Commonly known as:  ZOFRAN Inject 2 mLs (4 mg total) into the vein every 6 (six) hours as needed for nausea.   oxyCODONE 5 MG immediate release tablet Commonly known as:  Oxy IR/ROXICODONE Take 1-2 tablets (5-10 mg total) by mouth every 4 (four) hours as needed for moderate pain or severe pain (5mg  for moderate pain, 10mg  for severe pain).   pantoprazole 40 MG tablet Commonly known as:  PROTONIX Take 1 tablet (40 mg total) by mouth daily. Start taking on:  Sep 20, 2018 What changed:    medication strength  how much to take   predniSONE 20 MG tablet Commonly known as:  DELTASONE Take 2 tablets (40 mg total) by mouth daily with breakfast. Start taking on:  Sep 20, 2018   saccharomyces boulardii 250 MG capsule Commonly known as:  FLORASTOR Take 1 capsule (250 mg total) by mouth 2 (two) times daily.       Allergies  Allergen Reactions  . Macrobid [Nitrofurantoin Macrocrystal] Hives and Itching    Consultations:  General Surgery  Cardiology  Palliative Care   Procedures/Studies: Ct Abdomen Pelvis Wo Contrast  Result Date: 09/09/2018 CLINICAL DATA:  Decreased appetite. Leukocytosis. Recent small bowel resection and adhesiolysis on 08/21/2018. EXAM: CT ABDOMEN AND PELVIS WITHOUT CONTRAST TECHNIQUE: Multidetector CT imaging of the abdomen and pelvis was performed following the standard protocol without IV contrast. COMPARISON:  08/19/2018 FINDINGS: Lower chest: Dependent collapse/consolidation in both  lower lobes. Hepatobiliary: No focal abnormality in the liver on this study without intravenous contrast. Tiny calcified gallstones evident. No intrahepatic or extrahepatic biliary dilation. Pancreas: No focal mass lesion. No dilatation of the main duct. No intraparenchymal cyst. No peripancreatic edema. Spleen: No splenomegaly. No focal mass lesion. Adrenals/Urinary Tract: No adrenal nodule or mass. 2.1 cm low-density lesion lower pole right kidney is stable and compatible with cyst. Left kidney unremarkable on this noncontrast study. No evidence for hydroureter. The urinary bladder appears normal for the degree of distention. Stomach/Bowel: Stomach is unremarkable. No gastric wall thickening. No evidence of outlet obstruction. Duodenum is normally positioned as is the ligament of Treitz. No small bowel wall thickening. No small bowel dilatation. Small bowel anastomosis noted right abdomen. The terminal ileum is normal. No gross colonic mass. No colonic wall thickening. Vascular/Lymphatic: There is abdominal aortic atherosclerosis without aneurysm. There is no gastrohepatic or hepatoduodenal  ligament lymphadenopathy. No intraperitoneal or retroperitoneal lymphadenopathy. No pelvic sidewall lymphadenopathy. Reproductive: Unremarkable Other: A small amount of intraperitoneal free gas is identified (see image 60/series 3). Coronal imaging demonstrates a small gas locule immediately adjacent to the small bowel anastomosis (coronal image 42/series 6). There is some minimal edema in the mesentery adjacent to the gas bubbles, but no substantial fluid is identified in the cul-de-sac or para colic gutters. Musculoskeletal: Midline anterior abdominal wall open wound evident. No worrisome lytic or sclerotic osseous abnormality. IMPRESSION: 1. Small volume extraperitoneal free gas identified in the central mesentery, immediately adjacent to the small bowel anastomosis. No gross free intraperitoneal gas and there is no  substantial intraperitoneal free fluid. Residual extraluminal gas is not expected more than about 10 days out from surgery and despite the absence of intraperitoneal free fluid, anastomotic leak cannot be excluded. 2. Cholelithiasis. 3.  Aortic Atherosclerois (ICD10-170.0) Electronically Signed   By: Misty Stanley M.D.   On: 09/09/2018 13:46   Dg Chest 1 View  Result Date: 09/09/2018 CLINICAL DATA:  Leukocytosis. EXAM: CHEST  1 VIEW COMPARISON:  Chest x-ray dated Sep 01, 2018. FINDINGS: Unchanged left chest wall pacemaker. Unchanged right internal jugular central venous catheter. Stable mild cardiomegaly. Atherosclerotic calcification of the aortic arch. Improved pulmonary vascular congestion. Persistent bibasilar atelectasis, improved on the right. No pleural effusion or pneumothorax. No acute osseous abnormality. IMPRESSION: Improved pulmonary vascular congestion and right basilar atelectasis. Electronically Signed   By: Titus Dubin M.D.   On: 09/09/2018 11:30   Dg Chest Port 1 View  Result Date: 09/01/2018 CLINICAL DATA:  Sudden increase in shortness of breath. Placed on BiPAP. EXAM: PORTABLE CHEST 1 VIEW COMPARISON:  08/31/2018. FINDINGS: Cardiomegaly with multi lead pacer LEFT subclavian approach stable. Central venous catheter tip stable, RIGHT atrium. Moderate vascular congestion, stable. Bibasilar subsegmental atelectasis, stable. IMPRESSION: Stable chest. Electronically Signed   By: Staci Righter M.D.   On: 09/01/2018 16:34   Dg Chest Port 1 View  Result Date: 08/31/2018 CLINICAL DATA:  Hypoxia EXAM: PORTABLE CHEST 1 VIEW COMPARISON:  08/25/2018 FINDINGS: Unchanged AP portable examination with mild, diffuse interstitial pulmonary opacity, likely edema, and small, layering bilateral pleural effusions. Cardiomegaly with left chest multi lead pacer defibrillator. Right neck vascular catheter. IMPRESSION: Unchanged AP portable examination with mild, diffuse interstitial pulmonary opacity, likely  edema, and small, layering bilateral pleural effusions. Cardiomegaly with left chest multi lead pacer defibrillator. Right neck vascular catheter. Electronically Signed   By: Eddie Candle M.D.   On: 08/31/2018 19:20   Dg Chest Port 1 View  Result Date: 08/25/2018 CLINICAL DATA:  Acute respiratory failure with hypoxia. EXAM: PORTABLE CHEST 1 VIEW COMPARISON:  Chest x-ray from Aug 23, 2018. FINDINGS: Interval removal of the endotracheal tube. Unchanged enteric tube and right internal jugular central venous catheter. Unchanged left chest wall pacemaker. Stable cardiomegaly. New mild pulmonary vascular congestion. Unchanged bibasilar atelectasis and small bilateral pleural effusions. No pneumothorax. No acute osseous abnormality. IMPRESSION: 1. New mild pulmonary vascular congestion. 2. Unchanged bibasilar atelectasis small bilateral pleural effusions. Electronically Signed   By: Titus Dubin M.D.   On: 08/25/2018 07:11   Dg Chest Port 1 View  Result Date: 08/23/2018 CLINICAL DATA:  Acute respiratory failure. EXAM: PORTABLE CHEST 1 VIEW COMPARISON:  Chest x-ray from yesterday. FINDINGS: Unchanged endotracheal and enteric tubes. Unchanged right internal jugular central venous catheter. Unchanged left chest wall pacemaker. Stable cardiomegaly. Normal pulmonary vascularity. Unchanged mild bibasilar atelectasis and small bilateral pleural effusions. No consolidation or pneumothorax.  No acute osseous abnormality. IMPRESSION: 1. Unchanged mild bibasilar atelectasis and small bilateral pleural effusions. Electronically Signed   By: Titus Dubin M.D.   On: 08/23/2018 07:16   Dg Chest Port 1 View  Result Date: 08/22/2018 CLINICAL DATA:  Respiratory failure. EXAM: PORTABLE CHEST 1 VIEW COMPARISON:  08/21/2018 FINDINGS: Endotracheal tube remains with the tip approximately 2 cm above the carina. Stable positioning of central line at the SVC/RA junction. Gastric decompression tube extends below the diaphragm. Stable  appearance of biventricular pacer/ICD. There is some interval improved aeration at both lung bases with bibasilar atelectasis remaining and probable small bilateral pleural effusions. No pneumothorax or overt pulmonary edema. IMPRESSION: Improved aeration of both lung bases with bibasilar atelectasis remaining and probable small bilateral pleural effusions. Electronically Signed   By: Aletta Edouard M.D.   On: 08/22/2018 07:47   Dg Chest Port 1 View  Result Date: 08/21/2018 CLINICAL DATA:  Status post laparotomy for small bowel obstruction. EXAM: PORTABLE CHEST 1 VIEW COMPARISON:  Chest x-ray dated August 13, 2018. FINDINGS: Endotracheal tube tip 2.7 cm above the carina. Enteric tube in the stomach. Unchanged left chest wall pacemaker. Stable cardiomegaly. Atherosclerotic calcification of the aortic arch. Normal pulmonary vascularity. Right greater than left basilar atelectasis with small bilateral pleural effusions. No pneumothorax. No acute osseous abnormality. IMPRESSION: 1. Appropriately positioned endotracheal and enteric tubes. 2. Bibasilar atelectasis with small bilateral pleural effusions. Electronically Signed   By: Titus Dubin M.D.   On: 08/21/2018 12:09   Dg Chest Port 1v Same Day  Result Date: 08/21/2018 CLINICAL DATA:  Central line placement EXAM: PORTABLE CHEST 1 VIEW COMPARISON:  Portable exam 1626 hours compared to 1148 hours FINDINGS: Tip of endotracheal tube projects 3.9 cm above carina. Nasogastric tube extends into stomach. RIGHT jugular central venous catheter with tip projecting over cavoatrial junction. LEFT subclavian AICD leads unchanged. Upper normal heart size. Atherosclerotic calcifications aorta. Bibasilar atelectasis and LEFT pleural effusion again seen. Upper lungs clear. No pneumothorax following central line placement. Bones demineralized. IMPRESSION: No pneumothorax following central line placement. Persistent bibasilar atelectasis and LEFT pleural effusion.  Electronically Signed   By: Lavonia Dana M.D.   On: 08/21/2018 16:51   Dg Abd Portable 1v  Result Date: 08/20/2018 CLINICAL DATA:  78 year old female with small bowel obstruction EXAM: PORTABLE ABDOMEN - 1 VIEW COMPARISON:  CT 08/19/2018 FINDINGS: Similar appearance of the dilated small bowel, with no significant change. Paucity of colonic gas. Gastric tube terminates left upper quadrant. IMPRESSION: Persisting small bowel obstruction without significant change. Gastric tube terminates in the left upper quadrant. Electronically Signed   By: Corrie Mckusick D.O.   On: 08/20/2018 13:38   Dg Swallowing Func-speech Pathology  Result Date: 09/02/2018 Objective Swallowing Evaluation: Type of Study: MBS-Modified Barium Swallow Study  Patient Details Name: THOMAS RHUDE MRN: [id number] Date of Birth: [birth year] Today's Date: 09/02/2018 Time: SLP Start Time (ACUTE ONLY): 1315 -SLP Stop Time (ACUTE ONLY): 1340 SLP Time Calculation (min) (ACUTE ONLY): 25 min Past Medical History: Past Medical History: Diagnosis Date . Anemia   Hgb of 9-10 . Anemia of chronic disease   Hgb of 9-10 chronically; 06/2010: H&H-10.7/33.5, MCV-81, normal iron studies in 2010  . Arteriosclerotic cardiovascular disease (ASCVD)   Remote PTCA by patient report; LBBB; associated cardiomyopathy, presumed ischemic with EF 40-45% previously, 20% in 06/2009; h/o clinical congestive heart failure; negative stress nuclear in 2009 with inferoseptal and apical scar . Automatic implantable cardioverter-defibrillator in situ  . Breast cancer ([place])  breast . Chronic bronchitis ([place])  . Chronic renal disease, stage 3, moderately decreased glomerular filtration rate (GFR) between 30-59 mL/min/1.73 square meter (HCC) 02/01/2016 . Congestive heart failure (CHF) ([place])  . Elevated cholesterol  . Elevated sed rate  . GERD (gastroesophageal reflux disease)  . GI bleed  . Gout  . HOH (hard of hearing)  . Hyperlipidemia  . Hypertension  . Rheumatoid arthritis ([place])  . UTI  (urinary tract infection)  Past Surgical History: Past Surgical History: Procedure Laterality Date . BI-VENTRICULAR IMPLANTABLE CARDIOVERTER DEFIBRILLATOR N/A 07/28/2012  Procedure: BI-VENTRICULAR IMPLANTABLE CARDIOVERTER DEFIBRILLATOR  (CRT-D);  Surgeon: Evans Lance, MD;  Location: [facility] CATH LAB;  Service: Cardiovascular;  Laterality: N/A; . BI-VENTRICULAR IMPLANTABLE CARDIOVERTER DEFIBRILLATOR  (CRT-D)  07/28/2012 . BOWEL RESECTION N/A 08/21/2018  Procedure: Small Bowel Resection;  Surgeon: Jovita Kussmaul, MD;  Location: [place];  Service: General;  Laterality: N/A; . BREAST BIOPSY Bilateral  . CATARACT EXTRACTION W/ INTRAOCULAR LENS IMPLANT Left  . CATARACT EXTRACTION W/PHACO Right 09/15/2013  Procedure: CATARACT EXTRACTION PHACO AND INTRAOCULAR LENS PLACEMENT (IOC);  Surgeon: Elta Guadeloupe T. Gershon Crane, MD;  Location: [facility] ORS;  Service: Ophthalmology;  Laterality: Right;  CDE:  10.74 . COLONOSCOPY N/A 03/04/2015  Procedure: COLONOSCOPY;  Surgeon: Rogene Houston, MD;  Location: [facility] ENDO SU[facility];  Service: Endoscopy;  Laterality: N/A;  10:50  . ESOPHAGOGASTRODUODENOSCOPY N/A 03/04/2015  Procedure: ESOPHAGOGASTRODUODENOSCOPY (EGD);  Surgeon: Rogene Houston, MD;  Location: [facility];  Service: Endoscopy;  Laterality: N/A; . ESOPHAGOGASTRODUODENOSCOPY N/A 09/21/2016  Procedure: ESOPHAGOGASTRODUODENOSCOPY (EGD);  Surgeon: Rogene Houston, MD;  Location[facility];  Service: Endoscopy;  Laterality: N/A; . GIVENS CAPSULE STUDY N/A 09/22/2016  Procedure: GIVENS CAPSULE STUDY;  Surgeon: Rogene Houston, MD;  Location: [facility];  Service: Endoscopy;  Laterality: N/A; . LAPAROTOMY N/A 08/21/2018  Procedure: EXPLORATORY LAPAROTOMY WITH LYSIS OF ADHESIONS;  Surgeon: Jovita Kussmaul, MD;  Location: [place];  Service: General;  Laterality: N/A; . MASTECTOMY Left 1998 . TENDON TRANSFER Right 08/15/2017  Procedure: TENDON TRANSFER RIGHT WRIST EXTENSORS AS NEEDED, ULNA RESECTION AND STABILIZATION;  Surgeon: Charlotte Crumb, MD;  Location: [place];  Service: Orthopedics;  Laterality: Right; . TOTAL KNEE ARTHROPLASTY Right   Dr.Harrison . TUBAL LIGATION   HPI: Patient is a 78 y.o. female with PMH: chronic CHF, CAD with remote MI, HTN, hyperlipidemia, CKD stage III, chronic anemia, GERD, who underwent ex-lap for SBO on 4/30. She was transferred to ICU post-op for severe hypotension and shock, was intubated from 4/30 to 5/3.  Subjective: pleasant, cooperative, hard of hearing Assessment / Plan / Recommendation CHL IP CLINICAL IMPRESSIONS 09/02/2018 Clinical Impression Pt presents with a primary oral dysphagia marked by difficulty manipulating mechanical soft solids, requiring eventual manual removal from oral cavity; purees required lingual pumping with eventual transition into pharynx.  Once material reached valleculae, the pharyngeal swallow was triggered and solids passed readily through UES into esophagus without difficulty.  There was no pharyngeal residue noted throughout study.  Thin liquids were consumed with occasional high, transient penetration (Penetration-Aspiration Scale score of #2, considered WNL).  There was no aspiration, even when taxing the mechanical system with large, successive and mixed boluses.  Recommend continuing a pureed/dysphagia 1 diet; give meds whole in puree; allow thin liquids; assist with meals/self-feeding as needed.  SLP Visit Diagnosis Dysphagia, oral phase (R13.11) Attention and concentration deficit following -- Frontal lobe and executive function deficit following -- Impact on safety and function Mild aspiration risk   CHL  IP TREATMENT RECOMMENDATION 09/02/2018 Treatment Recommendations Therapy as outlined in treatment plan below   Prognosis 08/26/2018 Prognosis for Safe Diet Advancement Good Barriers to Reach Goals -- Barriers/Prognosis Comment -- CHL IP DIET RECOMMENDATION 09/02/2018 SLP Diet Recommendations Thin liquid;Dysphagia 1 (Puree) solids Liquid Administration via Cup;Straw Medication Administration Whole meds  with puree Compensations -- Postural Changes --   CHL IP OTHER RECOMMENDATIONS 09/02/2018 Recommended Consults -- Oral Care Recommendations Oral care BID Other Recommendations --   CH[facility] IP FOLLOW UP RECOMMENDATIONS 09/02/2018 Follow up Recommendations Skilled Nursing facility   [facility] IP FREQUENCY AND DURATION 09/02/2018 Speech Therapy Frequency (ACUTE ONLY) min 1 x/week Treatment Duration --      CH[facility] IP ORAL PHASE 09/02/2018 Oral Phase Impaired Oral - Pudding Teaspoon -- Oral - Pudding Cup -- Oral - Honey Teaspoon -- Oral - Honey Cup -- Oral - Nectar Teaspoon -- Oral - Nectar Cup -- Oral - Nectar Straw -- Oral - Thin Teaspoon -- Oral - Thin Cup -- Oral - Thin Straw WFL Oral - Puree Weak lingual manipulation;Lingual pumping;Reduced posterior propulsion;Delayed oral transit Oral - Mech Soft Weak lingual manipulation;Lingual pumping;Reduced posterior propulsion;Delayed oral transit Oral - Regular -- Oral - Multi-Consistency -- Oral - Pill -- Oral Phase - Comment --  CHL IP PHARYNGEAL PHASE 09/02/2018 Pharyngeal Phase WFL Pharyngeal- Pudding Teaspoon -- Pharyngeal -- Pharyngeal- Pudding Cup -- Pharyngeal -- Pharyngeal- Honey Teaspoon -- Pharyngeal -- Pharyngeal- Honey Cup -- Pharyngeal -- Pharyngeal- Nectar Teaspoon -- Pharyngeal -- Pharyngeal- Nectar Cup -- Pharyngeal -- Pharyngeal- Nectar Straw -- Pharyngeal -- Pharyngeal- Thin Teaspoon -- Pharyngeal -- Pharyngeal- Thin Cup -- Pharyngeal -- Pharyngeal- Thin Straw -- Pharyngeal -- Pharyngeal- Puree -- Pharyngeal -- Pharyngeal- Mechanical Soft -- Pharyngeal -- Pharyngeal- Regular -- Pharyngeal -- Pharyngeal- Multi-consistency -- Pharyngeal -- Pharyngeal- Pill -- Pharyngeal -- Pharyngeal Comment --  No flowsheet data found. [name] 09/02/2018, 3:56 PM              [place] Ekg Site Rite  Result Date: 09/15/2018 If [facility] image not attached, placement could not be confirmed due to current cardiac rhythm.  [place] Ekg Site Rite  Result Date: 08/21/2018 If Site Rite  image not attached, placement could not be confirmed due to current cardiac rhythm.     Subjective: She denies complaints this AM  Discharge Exam: Vitals:   09/19/18 0830 09/19/18 0916  BP:  (!) 120/54  Pulse: 70 76  Resp: 18   Temp:    SpO2: 100%    Vitals:   09/18/18 2310 09/19/18 0404 09/19/18 0830 09/19/18 0916  BP: (!) 134/51 (!) 119/45  (!) 120/54  Pulse: 73 79 70 76  Resp: 19 17 18    Temp: 97.8 F (36.6 C) 98.6 F (37 C)    TempSrc: Oral Oral    SpO2: 100% 100% 100%   Weight:  60.7 kg    Height:        General exam:77 y.o.femaleAppears calm and comfortable  Respiratory system: Clear to auscultation. Respiratory effort normal. Cardiovascular system:S1 &S2 heard, RRR. No JVD, murmurs, rubs, gallops or clicks. No pedal edema. Gastrointestinal system:BS+, surgical dressing CDI, soft, non-tender Central nervous system:Alert and oriented. No focal neurological deficits. She is hard of hearing Extremities: Symmetric 5 x 5 power.    The results of significant diagnostics from this hospitalization (including imaging, microbiology, ancillary and laboratory) are listed below for reference.     Microbiology: No results found for this or any previous visit (from the past 240 hour(s)).  Labs: BNP (last 3 results) Recent Labs    08/13/18 1926  BNP 202.5*   Basic Metabolic Panel: Recent Labs  Lab 09/15/18 0459 09/15/18 1436 09/16/18 0325 09/17/18 0230 09/18/18 0318 09/19/18 0752  NA 145  --  145 141 142 146*  K 5.5* 5.1 5.0 5.4* 4.8 4.7  CL 115*  --  114* 110 112* 113*  CO2 17*  --  20* 21* 21* 23  GLUCOSE 90  --  124* 176* 157* 193*  BUN 88*  --  69* 66* 68* 68*  CREATININE 1.49*  --  1.41* 1.42* 1.27* 1.22*  CALCIUM 8.5*  --  8.5* 8.1* 8.0* 8.4*  MG 2.2  --  1.9 1.7 1.8 1.8  PHOS 3.7  --   --  3.0 2.7 3.2   Liver Function Tests: Recent Labs  Lab 09/15/18 0459 09/16/18 0325 09/17/18 0230 09/18/18 0318 09/19/18 0752  AST 60* 63* 59* 48*   --   ALT 75* 78* 83* 81*  --   ALKPHOS 150* 159* 162* 166*  --   BILITOT 4.6* 4.2* 4.2* 3.3*  --   PROT 5.5* 5.1* 5.1* 5.0*  --   ALBUMIN 1.7* 1.7* 1.6* 1.6* 1.6*   No results for input(s): LIPASE, AMYLASE in the last 168 hours. No results for input(s): AMMONIA in the last 168 hours. CBC: Recent Labs  Lab 09/14/18 0906 09/15/18 0459 09/16/18 0325 09/17/18 0230 09/18/18 0318  WBC 12.1* 13.0* 12.2* 12.1* 12.2*  NEUTROABS 9.6* 12.2*  --   --  10.3*  HGB 8.2* 8.4* 7.9* 8.1* 7.8*  HCT 27.7* 27.5* 26.5* 27.2* 25.4*  MCV 93.6 93.2 92.3 93.5 92.0  PLT 190 193 189 189 187   Cardiac Enzymes: No results for input(s): CKTOTAL, CKMB, CKMBINDEX, TROPONINI in the last 168 hours. BNP: Invalid input(s): POCBNP CBG: Recent Labs  Lab 09/18/18 1607 09/18/18 2008 09/18/18 2325 09/19/18 0403 09/19/18 0850  GLUCAP 272* 107* 220* 121* 211*   D-Dimer No results for input(s): DDIMER in the last 72 hours. Hgb A1c No results for input(s): HGBA1C in the last 72 hours. Lipid Profile No results for input(s): CHOL, HDL, LDLCALC, TRIG, CHOLHDL, LDLDIRECT in the last 72 hours. Thyroid function studies No results for input(s): TSH, T4TOTAL, T3FREE, THYROIDAB in the last 72 hours.  Invalid input(s): FREET3 Anemia work up No results for input(s): VITAMINB12, FOLATE, FERRITIN, TIBC, IRON, RETICCTPCT in the last 72 hours. Urinalysis    Component Value Date/Time   COLORURINE YELLOW 09/09/2018 0939   APPEARANCEUR CLEAR 09/09/2018 0939   LABSPEC 1.010 09/09/2018 0939   PHURINE 5.0 09/09/2018 0939   GLUCOSEU NEGATIVE 09/09/2018 0939   HGBUR NEGATIVE 09/09/2018 0939   BILIRUBINUR NEGATIVE 09/09/2018 0939   KETONESUR NEGATIVE 09/09/2018 0939   PROTEINUR NEGATIVE 09/09/2018 0939   UROBILINOGEN 0.2 07/17/2013 2240   NITRITE NEGATIVE 09/09/2018 0939   LEUKOCYTESUR NEGATIVE 09/09/2018 0939   Sepsis Labs Invalid input(s): PROCALCITONIN,  WBC,  LACTICIDVEN Microbiology No results found for this or  any previous visit (from the past 240 hour(s)).   Time coordinating discharge: 45 minutes spent in the coordination of this discharge.   SIGNED:   Jonnie Finner, DO  [facility] 09/19/2018, 12:19 PM Pager   If 7PM-7AM, please contact night-coverage [email or web address] Password [email or web address]

## 2018-09-19 NOTE — Consult Note (Signed)
   [facility] Inpatient Consult   09/19/2018  [name] [birth year] [id number]    Update note:  Following patient for disposition with her [employer] plan, long length of stay and 47% extreme high risk for unplanned readmission and hospitalization.  Transition of care CM note reviewed showing discharge disposition of patient to LTAC (Long-term Acute Care). Patient will transition to [facility] today for LTAC needs as chosen by spouse.  Patient has no immediate care management needs for home at this time.   For questions and additional information, please call:  Amrie Gurganus A. Dani Danis, BSN, RN-BC [facility] Liaison Cell: [phone]

## 2018-09-19 NOTE — TOC Progression Note (Addendum)
Transition of Care [facility]) - Progression Note    Patient Details  Name: LINDER PRAJAPATI MRN: [id number] Date of Birth: 05-19-1940  Transition of Care [facility]) CM/SW Contact  Bartholomew Crews, RN Phone Number: [phone] 09/19/2018, 11:18 AM  Clinical Narrative:    Received referral for LTAC. Spoke with Raquel Sarna at [facility] who is assessing patient need, and will f/u with CM.   Spoke with Corrina at [facility] who is also assessing patient need, and will f/u with CM.  Update: [facility]. MD notified and will discuss plan with family.   Expected Discharge Plan: [place] Barriers to Discharge: Continued Medical Work up  Expected Discharge Plan and Services Expected Discharge Plan: [place] arrangements for the past 2 months: Single Family Home                                       Social Determinants of Health (SDOH) Interventions    Readmission Risk Interventions Readmission Risk Prevention Plan 09/17/2018 08/14/2018  Transportation Screening - Complete  Palliative Care Screening Complete -  Some recent data might be hidden

## 2018-09-19 NOTE — TOC Transition Note (Addendum)
Transition of Care [facility]) - CM/SW Discharge Note   Patient Details  Name: VINA BYRD MRN: [id number] Date of Birth: November 24, 1940  Transition of Care [facility]) CM/SW Contact:  Bartholomew Crews, RN Phone Number: [phone] 09/19/2018, 12:33 PM   Clinical Narrative:    Spoke with spouse, Fuller Mandril, via telephone to discuss choice for LTAC. Patient to transition to [facility] today.   Update: Patient is assigned to 5e19. Attending Dr. Laren Everts. Bedside nurse to call report to [phone],    Final next level of care: Long Term Acute Care (LTAC) Barriers to Discharge: No Barriers Identified   Patient Goals and CMS Choice   CMS Medicare.gov Compare Post Acute Care list provided to:: Patient Represenative (must comment)(spouse - Fuller Mandril) Choice offered to / list presented to : Spouse  Discharge Placement                       Discharge Plan and Services                DME Arranged: N/A DME Agency: NA       HH Arranged: NA HH Agency: NA        Social Determinants of Health (SDOH) Interventions     Readmission Risk Interventions Readmission Risk Prevention Plan 09/17/2018 08/14/2018  Transportation Screening - Complete  Palliative Care Screening Complete -  Some recent data might be hidden

## 2018-09-19 NOTE — Progress Notes (Addendum)
Patient ID: [name], female   DOB: [birth year], 78 y.o.   MRN: [id number]    29 Days Post-Op  Subjective: Patient confused.  Talkative but difficult to understand.  Cortrak in place.  Unclear if patient is really eating anything much on her own  Objective: Vital signs in last 24 hours: Temp:  [97.8 F (36.6 C)-98.6 F (37 C)] 98.6 F (37 C) (05/29 0404) Pulse Rate:  [73-87] 79 (05/29 0404) Resp:  [15-20] 17 (05/29 0404) BP: (119-134)/(45-52) 119/45 (05/29 0404) SpO2:  [100 %] 100 % (05/29 0404) Weight:  [60.7 kg] 60.7 kg (05/29 0404) Last BM Date: 09/18/18  Intake/Output from previous day: 05/28 0701 - 05/29 0700 In: 1295 [P.O.:30; NG/GT:1265] Out: -  Intake/Output this shift: No intake/output data recorded.  PE: Abd: wound is stable with bowel present.  No evidence of fistula at this time.  mepitel border still in place and then WD dressing.  Rest of her abdomen is benign.  Lab Results:  Recent Labs    09/17/18 0230 09/18/18 0318  WBC 12.1* 12.2*  HGB 8.1* 7.8*  HCT 27.2* 25.4*  PLT 189 187   BMET Recent Labs    09/17/18 0230 09/18/18 0318  NA 141 142  K 5.4* 4.8  CL 110 112*  CO2 21* 21*  GLUCOSE 176* 157*  BUN 66* 68*  CREATININE 1.42* 1.27*  CALCIUM 8.1* 8.0*   PT/INR No results for input(s): LABPROT, INR in the last 72 hours. CMP     Component Value Date/Time   NA 142 09/18/2018 0318   K 4.8 09/18/2018 0318   CL 112 (H) 09/18/2018 0318   CO2 21 (L) 09/18/2018 0318   GLUCOSE 157 (H) 09/18/2018 0318   BUN 68 (H) 09/18/2018 0318   CREATININE 1.27 (H) 09/18/2018 0318   CREATININE 1.07 (H) 02/13/2018 1142   CALCIUM 8.0 (L) 09/18/2018 0318   PROT 5.0 (L) 09/18/2018 0318   ALBUMIN 1.6 (L) 09/18/2018 0318   AST 48 (H) 09/18/2018 0318   ALT 81 (H) 09/18/2018 0318   ALKPHOS 166 (H) 09/18/2018 0318   BILITOT 3.3 (H) 09/18/2018 0318   GFRNONAA 40 (L) 09/18/2018 0318   GFRNONAA 49 (L) 02/13/2018 1142   GFRAA 46 (L) 09/18/2018 0318   GFRAA 57  (L) 02/13/2018 1142   Lipase     Component Value Date/Time   LIPASE 30 08/13/2018 1926       Studies/Results: No results found.  Anti-infectives: Anti-infectives (From admission, onward)   Start     Dose/Rate Route Frequency Ordered Stop   08/23/18 1400  piperacillin-tazobactam (ZOSYN) IVPB 2.25 g     2.25 g 100 mL/hr over 30 Minutes Intravenous Every 8 hours 08/23/18 0928 08/30/18 2230   08/21/18 1200  piperacillin-tazobactam (ZOSYN) IVPB 3.375 g  Status:  Discontinued     3.375 g 12.5 mL/hr over 240 Minutes Intravenous Every 8 hours 08/21/18 1102 08/23/18 0928   08/14/18 1000  hydroxychloroquine (PLAQUENIL) tablet 200 mg  Status:  Discontinued    Note to Pharmacy:  Take 1 tablet by mouth daily Monday through Friday only     200 mg Oral Once per day on Mon Tue Wed Thu Fri 08/14/18 0742 08/21/18 1147   08/13/18 2200  doxycycline (VIBRA-TABS) tablet 100 mg  Status:  Discontinued     100 mg Oral Every 12 hours 08/13/18 2134 08/13/18 2135   08/13/18 2200  doxycycline (VIBRAMYCIN) 100 mg in sodium chloride 0.9 % 250 mL IVPB  Status:  Discontinued  100 mg 125 mL/hr over 120 Minutes Intravenous Every 12 hours 08/13/18 2135 08/19/18 1702   08/13/18 2145  cefTRIAXone (ROCEPHIN) 1 g in sodium chloride 0.9 % 100 mL IVPB  Status:  Discontinued     1 g 200 mL/hr over 30 Minutes Intravenous Every 24 hours 08/13/18 2134 08/14/18 0743       Assessment/Plan Acute on chronic renal failure- improving ICD/CAD/CHF-per cardiology Acute Respiratory Failure-resolved.  ABL Anemia-hgb stable  SBO&ischemic/necroticbowel - S/Pex lapw/SBR, LOA, appendectomy, 04/30, Dr. Marlou Starks, POD 29 -prealbumin21 4 days ago -WBC stable at 12, no fevers.  No abx warranted - Mobilize and IS - wound remains dehisced,mepiteland wet to dry - Cortrak in place.  Palliative cares note noted in regards to husbands desires.  Only likely transition is LTACH given she requires Cortrak.  She can not  go to a SNF with a Cortrak.  If she does not begin to eat and take in more of her nutrition then she will likely be solely relying on artificial nutrition for sustaining life.  (patient is not a candidate for open/surgical g-tube just to go ahead and make note of that because technically unable to do it due to recent surgery) -no further surgical issues currently.  Will be prn over the weekend and recheck wound next week.  Call if needed prior to that  [place] VTE - SCDs,heparin ID -zosyn 4/30>>5/9. Foley - Removed 5/7 Follow up - Dr. Marlou Starks   LOS: 14 days    [name] , [facility] 09/19/2018, 8:06 AM Pager: [phone]

## 2018-09-19 NOTE — Progress Notes (Signed)
Patient was transferred to [place] [facility].  Report was given via phone to [facility] at [phone] and all questions were answered.  Cortrak feeding tube in place.  Patient transferred with all of their personal belongings.

## 2018-09-20 DIAGNOSIS — J9600 Acute respiratory failure, unspecified whether with hypoxia or hypercapnia: Secondary | ICD-10-CM | POA: Diagnosis not present

## 2018-09-20 DIAGNOSIS — K56609 Unspecified intestinal obstruction, unspecified as to partial versus complete obstruction: Secondary | ICD-10-CM | POA: Diagnosis not present

## 2018-09-20 DIAGNOSIS — N179 Acute kidney failure, unspecified: Secondary | ICD-10-CM | POA: Diagnosis not present

## 2018-09-20 DIAGNOSIS — N183 Chronic kidney disease, stage 3 (moderate): Secondary | ICD-10-CM | POA: Diagnosis not present

## 2018-09-20 LAB — URINALYSIS, ROUTINE W REFLEX MICROSCOPIC
Bilirubin Urine: NEGATIVE
Glucose, UA: NEGATIVE mg/dL
Hgb urine dipstick: NEGATIVE
Ketones, ur: NEGATIVE mg/dL
Nitrite: NEGATIVE
Protein, ur: NEGATIVE mg/dL
Specific Gravity, Urine: 1.014 (ref 1.005–1.030)
pH: 5 (ref 5.0–8.0)

## 2018-09-20 LAB — COMPREHENSIVE METABOLIC PANEL
ALT: 92 U/L — ABNORMAL HIGH (ref 0–44)
AST: 62 U/L — ABNORMAL HIGH (ref 15–41)
Albumin: 1.8 g/dL — ABNORMAL LOW (ref 3.5–5.0)
Alkaline Phosphatase: 173 U/L — ABNORMAL HIGH (ref 38–126)
Anion gap: 12 (ref 5–15)
BUN: 65 mg/dL — ABNORMAL HIGH (ref 8–23)
CO2: 23 mmol/L (ref 22–32)
Calcium: 8.5 mg/dL — ABNORMAL LOW (ref 8.9–10.3)
Chloride: 110 mmol/L (ref 98–111)
Creatinine, Ser: 1.26 mg/dL — ABNORMAL HIGH (ref 0.44–1.00)
GFR calc Af Amer: 47 mL/min — ABNORMAL LOW (ref >60)
GFR calc non Af Amer: 40 mL/min — ABNORMAL LOW (ref >60)
Glucose, Bld: 148 mg/dL — ABNORMAL HIGH (ref 70–99)
Potassium: 4.5 mmol/L (ref 3.5–5.1)
Sodium: 145 mmol/L (ref 135–145)
Total Bilirubin: 3 mg/dL — ABNORMAL HIGH (ref 0.3–1.2)
Total Protein: 5.3 g/dL — ABNORMAL LOW (ref 6.5–8.1)

## 2018-09-20 LAB — CBC WITH DIFFERENTIAL/PLATELET
Abs Immature Granulocytes: 0.13 10*3/uL — ABNORMAL HIGH (ref 0.00–0.07)
Basophils Absolute: 0 10*3/uL (ref 0.0–0.1)
Basophils Relative: 0 %
Eosinophils Absolute: 0 10*3/uL (ref 0.0–0.5)
Eosinophils Relative: 0 %
HCT: 26.6 % — ABNORMAL LOW (ref 36.0–46.0)
Hemoglobin: 7.8 g/dL — ABNORMAL LOW (ref 12.0–15.0)
Immature Granulocytes: 1 %
Lymphocytes Relative: 13 %
Lymphs Abs: 1.5 10*3/uL (ref 0.7–4.0)
MCH: 28.6 pg (ref 26.0–34.0)
MCHC: 29.3 g/dL — ABNORMAL LOW (ref 30.0–36.0)
MCV: 97.4 fL (ref 80.0–100.0)
Monocytes Absolute: 0.4 10*3/uL (ref 0.1–1.0)
Monocytes Relative: 4 %
Neutro Abs: 9.2 10*3/uL — ABNORMAL HIGH (ref 1.7–7.7)
Neutrophils Relative %: 82 %
Platelets: 175 10*3/uL (ref 150–400)
RBC: 2.73 MIL/uL — ABNORMAL LOW (ref 3.87–5.11)
RDW: 22.5 % — ABNORMAL HIGH (ref 11.5–15.5)
WBC: 11.2 10*3/uL — ABNORMAL HIGH (ref 4.0–10.5)
nRBC: 0.2 % (ref 0.0–0.2)

## 2018-09-20 LAB — TSH: TSH: 3.273 u[IU]/mL (ref 0.350–4.500)

## 2018-09-20 LAB — PROTIME-INR
INR: 1 (ref 0.8–1.2)
Prothrombin Time: 12.8 seconds (ref 11.4–15.2)

## 2018-09-20 LAB — HEMOGLOBIN A1C
Hgb A1c MFr Bld: 5.6 % (ref 4.8–5.6)
Mean Plasma Glucose: 114.02 mg/dL

## 2018-09-20 LAB — T4, FREE: Free T4: 1.39 ng/dL (ref 0.82–1.77)

## 2018-09-20 LAB — MAGNESIUM: Magnesium: 1.9 mg/dL (ref 1.7–2.4)

## 2018-09-20 LAB — PHOSPHORUS: Phosphorus: 3.4 mg/dL (ref 2.5–4.6)

## 2018-09-21 DIAGNOSIS — E870 Hyperosmolality and hypernatremia: Secondary | ICD-10-CM | POA: Diagnosis not present

## 2018-09-21 DIAGNOSIS — N179 Acute kidney failure, unspecified: Secondary | ICD-10-CM | POA: Diagnosis not present

## 2018-09-21 DIAGNOSIS — J9600 Acute respiratory failure, unspecified whether with hypoxia or hypercapnia: Secondary | ICD-10-CM | POA: Diagnosis not present

## 2018-09-21 DIAGNOSIS — K56609 Unspecified intestinal obstruction, unspecified as to partial versus complete obstruction: Secondary | ICD-10-CM | POA: Diagnosis not present

## 2018-09-21 LAB — RENAL FUNCTION PANEL
Albumin: 1.8 g/dL — ABNORMAL LOW (ref 3.5–5.0)
Anion gap: 10 (ref 5–15)
BUN: 62 mg/dL — ABNORMAL HIGH (ref 8–23)
CO2: 23 mmol/L (ref 22–32)
Calcium: 8.5 mg/dL — ABNORMAL LOW (ref 8.9–10.3)
Chloride: 111 mmol/L (ref 98–111)
Creatinine, Ser: 1.28 mg/dL — ABNORMAL HIGH (ref 0.44–1.00)
GFR calc Af Amer: 46 mL/min — ABNORMAL LOW (ref >60)
GFR calc non Af Amer: 39 mL/min — ABNORMAL LOW (ref >60)
Glucose, Bld: 120 mg/dL — ABNORMAL HIGH (ref 70–99)
Phosphorus: 3.8 mg/dL (ref 2.5–4.6)
Potassium: 4.2 mmol/L (ref 3.5–5.1)
Sodium: 144 mmol/L (ref 135–145)

## 2018-09-21 LAB — CBC
HCT: 27.9 % — ABNORMAL LOW (ref 36.0–46.0)
Hemoglobin: 8.2 g/dL — ABNORMAL LOW (ref 12.0–15.0)
MCH: 28.7 pg (ref 26.0–34.0)
MCHC: 29.4 g/dL — ABNORMAL LOW (ref 30.0–36.0)
MCV: 97.6 fL (ref 80.0–100.0)
Platelets: 163 10*3/uL (ref 150–400)
RBC: 2.86 MIL/uL — ABNORMAL LOW (ref 3.87–5.11)
RDW: 22 % — ABNORMAL HIGH (ref 11.5–15.5)
WBC: 11.2 10*3/uL — ABNORMAL HIGH (ref 4.0–10.5)
nRBC: 0.2 % (ref 0.0–0.2)

## 2018-09-21 LAB — URINE CULTURE: Culture: 60000 — AB

## 2018-09-21 LAB — MAGNESIUM: Magnesium: 2 mg/dL (ref 1.7–2.4)

## 2018-09-22 DIAGNOSIS — N179 Acute kidney failure, unspecified: Secondary | ICD-10-CM | POA: Diagnosis not present

## 2018-09-22 DIAGNOSIS — E870 Hyperosmolality and hypernatremia: Secondary | ICD-10-CM | POA: Diagnosis not present

## 2018-09-22 DIAGNOSIS — J9600 Acute respiratory failure, unspecified whether with hypoxia or hypercapnia: Secondary | ICD-10-CM | POA: Diagnosis not present

## 2018-09-22 DIAGNOSIS — K56609 Unspecified intestinal obstruction, unspecified as to partial versus complete obstruction: Secondary | ICD-10-CM | POA: Diagnosis not present

## 2018-09-23 DIAGNOSIS — E870 Hyperosmolality and hypernatremia: Secondary | ICD-10-CM | POA: Diagnosis not present

## 2018-09-23 DIAGNOSIS — N179 Acute kidney failure, unspecified: Secondary | ICD-10-CM | POA: Diagnosis not present

## 2018-09-23 DIAGNOSIS — K56609 Unspecified intestinal obstruction, unspecified as to partial versus complete obstruction: Secondary | ICD-10-CM | POA: Diagnosis not present

## 2018-09-23 DIAGNOSIS — J9600 Acute respiratory failure, unspecified whether with hypoxia or hypercapnia: Secondary | ICD-10-CM | POA: Diagnosis not present

## 2018-09-23 LAB — BASIC METABOLIC PANEL
Anion gap: 11 (ref 5–15)
BUN: 72 mg/dL — ABNORMAL HIGH (ref 8–23)
CO2: 23 mmol/L (ref 22–32)
Calcium: 8.3 mg/dL — ABNORMAL LOW (ref 8.9–10.3)
Chloride: 110 mmol/L (ref 98–111)
Creatinine, Ser: 1.46 mg/dL — ABNORMAL HIGH (ref 0.44–1.00)
GFR calc Af Amer: 39 mL/min — ABNORMAL LOW (ref >60)
GFR calc non Af Amer: 34 mL/min — ABNORMAL LOW (ref >60)
Glucose, Bld: 108 mg/dL — ABNORMAL HIGH (ref 70–99)
Potassium: 4 mmol/L (ref 3.5–5.1)
Sodium: 144 mmol/L (ref 135–145)

## 2018-09-24 DIAGNOSIS — N179 Acute kidney failure, unspecified: Secondary | ICD-10-CM | POA: Diagnosis not present

## 2018-09-24 DIAGNOSIS — J9600 Acute respiratory failure, unspecified whether with hypoxia or hypercapnia: Secondary | ICD-10-CM | POA: Diagnosis not present

## 2018-09-24 DIAGNOSIS — K56609 Unspecified intestinal obstruction, unspecified as to partial versus complete obstruction: Secondary | ICD-10-CM | POA: Diagnosis not present

## 2018-09-24 DIAGNOSIS — I129 Hypertensive chronic kidney disease with stage 1 through stage 4 chronic kidney disease, or unspecified chronic kidney disease: Secondary | ICD-10-CM | POA: Diagnosis not present

## 2018-09-24 LAB — BASIC METABOLIC PANEL
Anion gap: 12 (ref 5–15)
Anion gap: 10 (ref 5–15)
BUN: 58 mg/dL — ABNORMAL HIGH (ref 8–23)
BUN: 66 mg/dL — ABNORMAL HIGH (ref 8–23)
CO2: 20 mmol/L — ABNORMAL LOW (ref 22–32)
CO2: 22 mmol/L (ref 22–32)
Calcium: 7.8 mg/dL — ABNORMAL LOW (ref 8.9–10.3)
Calcium: 8.1 mg/dL — ABNORMAL LOW (ref 8.9–10.3)
Chloride: 99 mmol/L (ref 98–111)
Chloride: 105 mmol/L (ref 98–111)
Creatinine, Ser: 1.35 mg/dL — ABNORMAL HIGH (ref 0.44–1.00)
Creatinine, Ser: 1.2 mg/dL — ABNORMAL HIGH (ref 0.44–1.00)
GFR calc Af Amer: 43 mL/min — ABNORMAL LOW (ref >60)
GFR calc Af Amer: 49 mL/min — ABNORMAL LOW (ref >60)
GFR calc non Af Amer: 37 mL/min — ABNORMAL LOW (ref >60)
GFR calc non Af Amer: 43 mL/min — ABNORMAL LOW (ref >60)
Glucose, Bld: 448 mg/dL — ABNORMAL HIGH (ref 70–99)
Glucose, Bld: 213 mg/dL — ABNORMAL HIGH (ref 70–99)
Potassium: 3.9 mmol/L (ref 3.5–5.1)
Potassium: 4.4 mmol/L (ref 3.5–5.1)
Sodium: 131 mmol/L — ABNORMAL LOW (ref 135–145)
Sodium: 137 mmol/L (ref 135–145)

## 2018-09-25 DIAGNOSIS — D72829 Elevated white blood cell count, unspecified: Secondary | ICD-10-CM | POA: Diagnosis not present

## 2018-09-25 DIAGNOSIS — N179 Acute kidney failure, unspecified: Secondary | ICD-10-CM | POA: Diagnosis not present

## 2018-09-25 DIAGNOSIS — J9600 Acute respiratory failure, unspecified whether with hypoxia or hypercapnia: Secondary | ICD-10-CM | POA: Diagnosis not present

## 2018-09-25 DIAGNOSIS — K56609 Unspecified intestinal obstruction, unspecified as to partial versus complete obstruction: Secondary | ICD-10-CM | POA: Diagnosis not present

## 2018-09-25 LAB — BASIC METABOLIC PANEL
Anion gap: 7 (ref 5–15)
BUN: 46 mg/dL — ABNORMAL HIGH (ref 8–23)
CO2: 25 mmol/L (ref 22–32)
Calcium: 8.5 mg/dL — ABNORMAL LOW (ref 8.9–10.3)
Chloride: 109 mmol/L (ref 98–111)
Creatinine, Ser: 1.09 mg/dL — ABNORMAL HIGH (ref 0.44–1.00)
GFR calc Af Amer: 56 mL/min — ABNORMAL LOW (ref >60)
GFR calc non Af Amer: 48 mL/min — ABNORMAL LOW (ref >60)
Glucose, Bld: 71 mg/dL (ref 70–99)
Potassium: 3.8 mmol/L (ref 3.5–5.1)
Sodium: 141 mmol/L (ref 135–145)

## 2018-09-25 LAB — CBC
HCT: 24 % — ABNORMAL LOW (ref 36.0–46.0)
Hemoglobin: 7.3 g/dL — ABNORMAL LOW (ref 12.0–15.0)
MCH: 29.7 pg (ref 26.0–34.0)
MCHC: 30.4 g/dL (ref 30.0–36.0)
MCV: 97.6 fL (ref 80.0–100.0)
Platelets: 139 10*3/uL — ABNORMAL LOW (ref 150–400)
RBC: 2.46 MIL/uL — ABNORMAL LOW (ref 3.87–5.11)
RDW: 20.5 % — ABNORMAL HIGH (ref 11.5–15.5)
WBC: 5.7 10*3/uL (ref 4.0–10.5)
nRBC: 0 % (ref 0.0–0.2)

## 2018-09-26 DIAGNOSIS — D72829 Elevated white blood cell count, unspecified: Secondary | ICD-10-CM | POA: Diagnosis not present

## 2018-09-26 DIAGNOSIS — J9600 Acute respiratory failure, unspecified whether with hypoxia or hypercapnia: Secondary | ICD-10-CM | POA: Diagnosis not present

## 2018-09-26 DIAGNOSIS — K56609 Unspecified intestinal obstruction, unspecified as to partial versus complete obstruction: Secondary | ICD-10-CM | POA: Diagnosis not present

## 2018-09-26 DIAGNOSIS — N179 Acute kidney failure, unspecified: Secondary | ICD-10-CM | POA: Diagnosis not present

## 2018-09-27 DIAGNOSIS — K56609 Unspecified intestinal obstruction, unspecified as to partial versus complete obstruction: Secondary | ICD-10-CM | POA: Diagnosis not present

## 2018-09-27 DIAGNOSIS — N179 Acute kidney failure, unspecified: Secondary | ICD-10-CM | POA: Diagnosis not present

## 2018-09-27 DIAGNOSIS — J9600 Acute respiratory failure, unspecified whether with hypoxia or hypercapnia: Secondary | ICD-10-CM | POA: Diagnosis not present

## 2018-09-27 DIAGNOSIS — I129 Hypertensive chronic kidney disease with stage 1 through stage 4 chronic kidney disease, or unspecified chronic kidney disease: Secondary | ICD-10-CM | POA: Diagnosis not present

## 2018-09-28 DIAGNOSIS — J9600 Acute respiratory failure, unspecified whether with hypoxia or hypercapnia: Secondary | ICD-10-CM | POA: Diagnosis not present

## 2018-09-28 DIAGNOSIS — N179 Acute kidney failure, unspecified: Secondary | ICD-10-CM | POA: Diagnosis not present

## 2018-09-28 DIAGNOSIS — I129 Hypertensive chronic kidney disease with stage 1 through stage 4 chronic kidney disease, or unspecified chronic kidney disease: Secondary | ICD-10-CM | POA: Diagnosis not present

## 2018-09-28 DIAGNOSIS — K56609 Unspecified intestinal obstruction, unspecified as to partial versus complete obstruction: Secondary | ICD-10-CM | POA: Diagnosis not present

## 2018-09-28 LAB — BASIC METABOLIC PANEL
Anion gap: 11 (ref 5–15)
BUN: 27 mg/dL — ABNORMAL HIGH (ref 8–23)
CO2: 21 mmol/L — ABNORMAL LOW (ref 22–32)
Calcium: 8.3 mg/dL — ABNORMAL LOW (ref 8.9–10.3)
Chloride: 109 mmol/L (ref 98–111)
Creatinine, Ser: 1.29 mg/dL — ABNORMAL HIGH (ref 0.44–1.00)
GFR calc Af Amer: 45 mL/min — ABNORMAL LOW (ref >60)
GFR calc non Af Amer: 39 mL/min — ABNORMAL LOW (ref >60)
Glucose, Bld: 103 mg/dL — ABNORMAL HIGH (ref 70–99)
Potassium: 4.2 mmol/L (ref 3.5–5.1)
Sodium: 141 mmol/L (ref 135–145)

## 2018-09-29 DIAGNOSIS — D72829 Elevated white blood cell count, unspecified: Secondary | ICD-10-CM | POA: Diagnosis not present

## 2018-09-29 DIAGNOSIS — N179 Acute kidney failure, unspecified: Secondary | ICD-10-CM | POA: Diagnosis not present

## 2018-09-29 DIAGNOSIS — J9600 Acute respiratory failure, unspecified whether with hypoxia or hypercapnia: Secondary | ICD-10-CM | POA: Diagnosis not present

## 2018-09-29 DIAGNOSIS — K56609 Unspecified intestinal obstruction, unspecified as to partial versus complete obstruction: Secondary | ICD-10-CM | POA: Diagnosis not present

## 2018-09-29 LAB — BASIC METABOLIC PANEL
Anion gap: 8 (ref 5–15)
BUN: 24 mg/dL — ABNORMAL HIGH (ref 8–23)
CO2: 24 mmol/L (ref 22–32)
Calcium: 8.2 mg/dL — ABNORMAL LOW (ref 8.9–10.3)
Chloride: 108 mmol/L (ref 98–111)
Creatinine, Ser: 1.28 mg/dL — ABNORMAL HIGH (ref 0.44–1.00)
GFR calc Af Amer: 46 mL/min — ABNORMAL LOW (ref >60)
GFR calc non Af Amer: 39 mL/min — ABNORMAL LOW (ref >60)
Glucose, Bld: 75 mg/dL (ref 70–99)
Potassium: 3.9 mmol/L (ref 3.5–5.1)
Sodium: 140 mmol/L (ref 135–145)

## 2018-09-29 LAB — CBC
HCT: 27.1 % — ABNORMAL LOW (ref 36.0–46.0)
Hemoglobin: 8 g/dL — ABNORMAL LOW (ref 12.0–15.0)
MCH: 29.1 pg (ref 26.0–34.0)
MCHC: 29.5 g/dL — ABNORMAL LOW (ref 30.0–36.0)
MCV: 98.5 fL (ref 80.0–100.0)
Platelets: 179 10*3/uL (ref 150–400)
RBC: 2.75 MIL/uL — ABNORMAL LOW (ref 3.87–5.11)
RDW: 18.8 % — ABNORMAL HIGH (ref 11.5–15.5)
WBC: 4.6 10*3/uL (ref 4.0–10.5)
nRBC: 0 % (ref 0.0–0.2)

## 2018-09-29 LAB — MAGNESIUM: Magnesium: 1.6 mg/dL — ABNORMAL LOW (ref 1.7–2.4)

## 2018-09-30 DIAGNOSIS — J9600 Acute respiratory failure, unspecified whether with hypoxia or hypercapnia: Secondary | ICD-10-CM | POA: Diagnosis not present

## 2018-09-30 DIAGNOSIS — K56609 Unspecified intestinal obstruction, unspecified as to partial versus complete obstruction: Secondary | ICD-10-CM | POA: Diagnosis not present

## 2018-09-30 DIAGNOSIS — I129 Hypertensive chronic kidney disease with stage 1 through stage 4 chronic kidney disease, or unspecified chronic kidney disease: Secondary | ICD-10-CM | POA: Diagnosis not present

## 2018-09-30 DIAGNOSIS — N179 Acute kidney failure, unspecified: Secondary | ICD-10-CM | POA: Diagnosis not present

## 2018-09-30 LAB — BASIC METABOLIC PANEL
Anion gap: 8 (ref 5–15)
BUN: 21 mg/dL (ref 8–23)
CO2: 24 mmol/L (ref 22–32)
Calcium: 8.2 mg/dL — ABNORMAL LOW (ref 8.9–10.3)
Chloride: 107 mmol/L (ref 98–111)
Creatinine, Ser: 1.4 mg/dL — ABNORMAL HIGH (ref 0.44–1.00)
GFR calc Af Amer: 41 mL/min — ABNORMAL LOW (ref >60)
GFR calc non Af Amer: 35 mL/min — ABNORMAL LOW (ref >60)
Glucose, Bld: 78 mg/dL (ref 70–99)
Potassium: 4.2 mmol/L (ref 3.5–5.1)
Sodium: 139 mmol/L (ref 135–145)

## 2018-09-30 LAB — MAGNESIUM: Magnesium: 2.2 mg/dL (ref 1.7–2.4)

## 2018-09-30 LAB — PHOSPHORUS: Phosphorus: 2.8 mg/dL (ref 2.5–4.6)

## 2018-10-01 DIAGNOSIS — D72829 Elevated white blood cell count, unspecified: Secondary | ICD-10-CM | POA: Diagnosis not present

## 2018-10-01 DIAGNOSIS — I10 Essential (primary) hypertension: Secondary | ICD-10-CM | POA: Diagnosis not present

## 2018-10-01 DIAGNOSIS — K56609 Unspecified intestinal obstruction, unspecified as to partial versus complete obstruction: Secondary | ICD-10-CM | POA: Diagnosis not present

## 2018-10-01 DIAGNOSIS — N179 Acute kidney failure, unspecified: Secondary | ICD-10-CM | POA: Diagnosis not present

## 2018-10-02 ENCOUNTER — Other Ambulatory Visit (HOSPITAL_COMMUNITY): Payer: Medicare Other

## 2018-10-02 DIAGNOSIS — K5900 Constipation, unspecified: Secondary | ICD-10-CM | POA: Diagnosis not present

## 2018-10-02 DIAGNOSIS — K56609 Unspecified intestinal obstruction, unspecified as to partial versus complete obstruction: Secondary | ICD-10-CM | POA: Diagnosis not present

## 2018-10-02 DIAGNOSIS — N179 Acute kidney failure, unspecified: Secondary | ICD-10-CM | POA: Diagnosis not present

## 2018-10-02 DIAGNOSIS — D72829 Elevated white blood cell count, unspecified: Secondary | ICD-10-CM | POA: Diagnosis not present

## 2018-10-02 DIAGNOSIS — J9600 Acute respiratory failure, unspecified whether with hypoxia or hypercapnia: Secondary | ICD-10-CM | POA: Diagnosis not present

## 2018-10-03 DIAGNOSIS — N179 Acute kidney failure, unspecified: Secondary | ICD-10-CM | POA: Diagnosis not present

## 2018-10-03 DIAGNOSIS — K56609 Unspecified intestinal obstruction, unspecified as to partial versus complete obstruction: Secondary | ICD-10-CM | POA: Diagnosis not present

## 2018-10-03 DIAGNOSIS — D72829 Elevated white blood cell count, unspecified: Secondary | ICD-10-CM | POA: Diagnosis not present

## 2018-10-03 DIAGNOSIS — J9600 Acute respiratory failure, unspecified whether with hypoxia or hypercapnia: Secondary | ICD-10-CM | POA: Diagnosis not present

## 2018-10-03 LAB — BASIC METABOLIC PANEL
Anion gap: 10 (ref 5–15)
BUN: 25 mg/dL — ABNORMAL HIGH (ref 8–23)
CO2: 20 mmol/L — ABNORMAL LOW (ref 22–32)
Calcium: 8.4 mg/dL — ABNORMAL LOW (ref 8.9–10.3)
Chloride: 104 mmol/L (ref 98–111)
Creatinine, Ser: 1.36 mg/dL — ABNORMAL HIGH (ref 0.44–1.00)
GFR calc Af Amer: 42 mL/min — ABNORMAL LOW (ref >60)
GFR calc non Af Amer: 37 mL/min — ABNORMAL LOW (ref >60)
Glucose, Bld: 55 mg/dL — ABNORMAL LOW (ref 70–99)
Potassium: 3.8 mmol/L (ref 3.5–5.1)
Sodium: 134 mmol/L — ABNORMAL LOW (ref 135–145)

## 2018-10-03 LAB — PHOSPHORUS: Phosphorus: 3.1 mg/dL (ref 2.5–4.6)

## 2018-10-03 LAB — CBC
HCT: 28.3 % — ABNORMAL LOW (ref 36.0–46.0)
Hemoglobin: 8.5 g/dL — ABNORMAL LOW (ref 12.0–15.0)
MCH: 28.6 pg (ref 26.0–34.0)
MCHC: 30 g/dL (ref 30.0–36.0)
MCV: 95.3 fL (ref 80.0–100.0)
Platelets: 138 10*3/uL — ABNORMAL LOW (ref 150–400)
RBC: 2.97 MIL/uL — ABNORMAL LOW (ref 3.87–5.11)
RDW: 17.4 % — ABNORMAL HIGH (ref 11.5–15.5)
WBC: 5 10*3/uL (ref 4.0–10.5)
nRBC: 0 % (ref 0.0–0.2)

## 2018-10-03 LAB — MAGNESIUM: Magnesium: 1.7 mg/dL (ref 1.7–2.4)

## 2018-10-04 DIAGNOSIS — D72829 Elevated white blood cell count, unspecified: Secondary | ICD-10-CM | POA: Diagnosis not present

## 2018-10-04 DIAGNOSIS — J9600 Acute respiratory failure, unspecified whether with hypoxia or hypercapnia: Secondary | ICD-10-CM | POA: Diagnosis not present

## 2018-10-04 DIAGNOSIS — K56609 Unspecified intestinal obstruction, unspecified as to partial versus complete obstruction: Secondary | ICD-10-CM | POA: Diagnosis not present

## 2018-10-04 DIAGNOSIS — N179 Acute kidney failure, unspecified: Secondary | ICD-10-CM | POA: Diagnosis not present

## 2018-10-04 LAB — MAGNESIUM: Magnesium: 2 mg/dL (ref 1.7–2.4)

## 2018-10-05 DIAGNOSIS — I10 Essential (primary) hypertension: Secondary | ICD-10-CM | POA: Diagnosis not present

## 2018-10-05 DIAGNOSIS — N179 Acute kidney failure, unspecified: Secondary | ICD-10-CM | POA: Diagnosis not present

## 2018-10-05 DIAGNOSIS — J9600 Acute respiratory failure, unspecified whether with hypoxia or hypercapnia: Secondary | ICD-10-CM | POA: Diagnosis not present

## 2018-10-05 DIAGNOSIS — K56609 Unspecified intestinal obstruction, unspecified as to partial versus complete obstruction: Secondary | ICD-10-CM | POA: Diagnosis not present

## 2018-10-06 DIAGNOSIS — N179 Acute kidney failure, unspecified: Secondary | ICD-10-CM | POA: Diagnosis not present

## 2018-10-06 DIAGNOSIS — K56609 Unspecified intestinal obstruction, unspecified as to partial versus complete obstruction: Secondary | ICD-10-CM | POA: Diagnosis not present

## 2018-10-06 DIAGNOSIS — I10 Essential (primary) hypertension: Secondary | ICD-10-CM | POA: Diagnosis not present

## 2018-10-06 DIAGNOSIS — J9600 Acute respiratory failure, unspecified whether with hypoxia or hypercapnia: Secondary | ICD-10-CM | POA: Diagnosis not present

## 2018-10-06 NOTE — Progress Notes (Signed)
No ICM remote transmission received for 10/07/2018 due to patient is hospitalized and next ICM transmission scheduled for 10/22/2018.

## 2018-10-07 DIAGNOSIS — I10 Essential (primary) hypertension: Secondary | ICD-10-CM | POA: Diagnosis not present

## 2018-10-07 DIAGNOSIS — N179 Acute kidney failure, unspecified: Secondary | ICD-10-CM | POA: Diagnosis not present

## 2018-10-07 DIAGNOSIS — J9600 Acute respiratory failure, unspecified whether with hypoxia or hypercapnia: Secondary | ICD-10-CM | POA: Diagnosis not present

## 2018-10-07 DIAGNOSIS — K56609 Unspecified intestinal obstruction, unspecified as to partial versus complete obstruction: Secondary | ICD-10-CM | POA: Diagnosis not present

## 2018-10-07 LAB — CBC
HCT: 26.8 % — ABNORMAL LOW (ref 36.0–46.0)
Hemoglobin: 8 g/dL — ABNORMAL LOW (ref 12.0–15.0)
MCH: 28.4 pg (ref 26.0–34.0)
MCHC: 29.9 g/dL — ABNORMAL LOW (ref 30.0–36.0)
MCV: 95 fL (ref 80.0–100.0)
Platelets: 106 10*3/uL — ABNORMAL LOW (ref 150–400)
RBC: 2.82 MIL/uL — ABNORMAL LOW (ref 3.87–5.11)
RDW: 16.8 % — ABNORMAL HIGH (ref 11.5–15.5)
WBC: 5 10*3/uL (ref 4.0–10.5)
nRBC: 0 % (ref 0.0–0.2)

## 2018-10-07 LAB — BASIC METABOLIC PANEL
Anion gap: 7 (ref 5–15)
BUN: 20 mg/dL (ref 8–23)
CO2: 23 mmol/L (ref 22–32)
Calcium: 8.1 mg/dL — ABNORMAL LOW (ref 8.9–10.3)
Chloride: 105 mmol/L (ref 98–111)
Creatinine, Ser: 1.63 mg/dL — ABNORMAL HIGH (ref 0.44–1.00)
GFR calc Af Amer: 34 mL/min — ABNORMAL LOW (ref >60)
GFR calc non Af Amer: 29 mL/min — ABNORMAL LOW (ref >60)
Glucose, Bld: 77 mg/dL (ref 70–99)
Potassium: 3.9 mmol/L (ref 3.5–5.1)
Sodium: 135 mmol/L (ref 135–145)

## 2018-10-07 LAB — MAGNESIUM: Magnesium: 1.6 mg/dL — ABNORMAL LOW (ref 1.7–2.4)

## 2018-10-08 DIAGNOSIS — K56609 Unspecified intestinal obstruction, unspecified as to partial versus complete obstruction: Secondary | ICD-10-CM | POA: Diagnosis not present

## 2018-10-08 DIAGNOSIS — I10 Essential (primary) hypertension: Secondary | ICD-10-CM | POA: Diagnosis not present

## 2018-10-08 DIAGNOSIS — J9600 Acute respiratory failure, unspecified whether with hypoxia or hypercapnia: Secondary | ICD-10-CM | POA: Diagnosis not present

## 2018-10-08 DIAGNOSIS — N179 Acute kidney failure, unspecified: Secondary | ICD-10-CM | POA: Diagnosis not present

## 2018-10-08 LAB — COMPREHENSIVE METABOLIC PANEL
ALT: 28 U/L (ref 0–44)
AST: 31 U/L (ref 15–41)
Albumin: 1.7 g/dL — ABNORMAL LOW (ref 3.5–5.0)
Alkaline Phosphatase: 300 U/L — ABNORMAL HIGH (ref 38–126)
Anion gap: 10 (ref 5–15)
BUN: 14 mg/dL (ref 8–23)
CO2: 21 mmol/L — ABNORMAL LOW (ref 22–32)
Calcium: 8.2 mg/dL — ABNORMAL LOW (ref 8.9–10.3)
Chloride: 104 mmol/L (ref 98–111)
Creatinine, Ser: 1.45 mg/dL — ABNORMAL HIGH (ref 0.44–1.00)
GFR calc Af Amer: 39 mL/min — ABNORMAL LOW (ref >60)
GFR calc non Af Amer: 34 mL/min — ABNORMAL LOW (ref >60)
Glucose, Bld: 64 mg/dL — ABNORMAL LOW (ref 70–99)
Potassium: 4.6 mmol/L (ref 3.5–5.1)
Sodium: 135 mmol/L (ref 135–145)
Total Bilirubin: 1.1 mg/dL (ref 0.3–1.2)
Total Protein: 5 g/dL — ABNORMAL LOW (ref 6.5–8.1)

## 2018-10-08 LAB — MAGNESIUM: Magnesium: 1.5 mg/dL — ABNORMAL LOW (ref 1.7–2.4)

## 2018-10-09 DIAGNOSIS — J9600 Acute respiratory failure, unspecified whether with hypoxia or hypercapnia: Secondary | ICD-10-CM | POA: Diagnosis not present

## 2018-10-09 DIAGNOSIS — K56609 Unspecified intestinal obstruction, unspecified as to partial versus complete obstruction: Secondary | ICD-10-CM | POA: Diagnosis not present

## 2018-10-09 DIAGNOSIS — N179 Acute kidney failure, unspecified: Secondary | ICD-10-CM | POA: Diagnosis not present

## 2018-10-09 DIAGNOSIS — I10 Essential (primary) hypertension: Secondary | ICD-10-CM | POA: Diagnosis not present

## 2018-10-09 LAB — MAGNESIUM: Magnesium: 2 mg/dL (ref 1.7–2.4)

## 2018-10-10 DIAGNOSIS — I10 Essential (primary) hypertension: Secondary | ICD-10-CM | POA: Diagnosis not present

## 2018-10-10 DIAGNOSIS — N179 Acute kidney failure, unspecified: Secondary | ICD-10-CM | POA: Diagnosis not present

## 2018-10-10 DIAGNOSIS — K56609 Unspecified intestinal obstruction, unspecified as to partial versus complete obstruction: Secondary | ICD-10-CM | POA: Diagnosis not present

## 2018-10-10 DIAGNOSIS — J9600 Acute respiratory failure, unspecified whether with hypoxia or hypercapnia: Secondary | ICD-10-CM | POA: Diagnosis not present

## 2018-10-11 DIAGNOSIS — I129 Hypertensive chronic kidney disease with stage 1 through stage 4 chronic kidney disease, or unspecified chronic kidney disease: Secondary | ICD-10-CM | POA: Diagnosis not present

## 2018-10-11 DIAGNOSIS — N179 Acute kidney failure, unspecified: Secondary | ICD-10-CM | POA: Diagnosis not present

## 2018-10-11 DIAGNOSIS — J9600 Acute respiratory failure, unspecified whether with hypoxia or hypercapnia: Secondary | ICD-10-CM | POA: Diagnosis not present

## 2018-10-11 DIAGNOSIS — K56609 Unspecified intestinal obstruction, unspecified as to partial versus complete obstruction: Secondary | ICD-10-CM | POA: Diagnosis not present

## 2018-10-14 DIAGNOSIS — I129 Hypertensive chronic kidney disease with stage 1 through stage 4 chronic kidney disease, or unspecified chronic kidney disease: Secondary | ICD-10-CM | POA: Diagnosis not present

## 2018-10-14 DIAGNOSIS — J9600 Acute respiratory failure, unspecified whether with hypoxia or hypercapnia: Secondary | ICD-10-CM | POA: Diagnosis not present

## 2018-10-14 DIAGNOSIS — N179 Acute kidney failure, unspecified: Secondary | ICD-10-CM | POA: Diagnosis not present

## 2018-10-14 DIAGNOSIS — K56609 Unspecified intestinal obstruction, unspecified as to partial versus complete obstruction: Secondary | ICD-10-CM | POA: Diagnosis not present

## 2018-10-14 LAB — CBC
HCT: 31.7 % — ABNORMAL LOW (ref 36.0–46.0)
Hemoglobin: 9.7 g/dL — ABNORMAL LOW (ref 12.0–15.0)
MCH: 28.2 pg (ref 26.0–34.0)
MCHC: 30.6 g/dL (ref 30.0–36.0)
MCV: 92.2 fL (ref 80.0–100.0)
Platelets: 225 10*3/uL (ref 150–400)
RBC: 3.44 MIL/uL — ABNORMAL LOW (ref 3.87–5.11)
RDW: 16 % — ABNORMAL HIGH (ref 11.5–15.5)
WBC: 14.8 10*3/uL — ABNORMAL HIGH (ref 4.0–10.5)
nRBC: 0 % (ref 0.0–0.2)

## 2018-10-14 LAB — RENAL FUNCTION PANEL
Albumin: 1.6 g/dL — ABNORMAL LOW (ref 3.5–5.0)
Anion gap: 10 (ref 5–15)
BUN: 15 mg/dL (ref 8–23)
CO2: 22 mmol/L (ref 22–32)
Calcium: 8.3 mg/dL — ABNORMAL LOW (ref 8.9–10.3)
Chloride: 107 mmol/L (ref 98–111)
Creatinine, Ser: 1.35 mg/dL — ABNORMAL HIGH (ref 0.44–1.00)
GFR calc Af Amer: 43 mL/min — ABNORMAL LOW (ref >60)
GFR calc non Af Amer: 37 mL/min — ABNORMAL LOW (ref >60)
Glucose, Bld: 77 mg/dL (ref 70–99)
Phosphorus: 4.1 mg/dL (ref 2.5–4.6)
Potassium: 4.1 mmol/L (ref 3.5–5.1)
Sodium: 139 mmol/L (ref 135–145)

## 2018-10-14 LAB — MAGNESIUM: Magnesium: 1.6 mg/dL — ABNORMAL LOW (ref 1.7–2.4)

## 2018-10-15 DIAGNOSIS — J9600 Acute respiratory failure, unspecified whether with hypoxia or hypercapnia: Secondary | ICD-10-CM | POA: Diagnosis not present

## 2018-10-15 DIAGNOSIS — N179 Acute kidney failure, unspecified: Secondary | ICD-10-CM | POA: Diagnosis not present

## 2018-10-15 DIAGNOSIS — K56609 Unspecified intestinal obstruction, unspecified as to partial versus complete obstruction: Secondary | ICD-10-CM | POA: Diagnosis not present

## 2018-10-15 DIAGNOSIS — I129 Hypertensive chronic kidney disease with stage 1 through stage 4 chronic kidney disease, or unspecified chronic kidney disease: Secondary | ICD-10-CM | POA: Diagnosis not present

## 2018-10-15 LAB — URINALYSIS, ROUTINE W REFLEX MICROSCOPIC
Bilirubin Urine: NEGATIVE
Glucose, UA: NEGATIVE mg/dL
Hgb urine dipstick: NEGATIVE
Ketones, ur: NEGATIVE mg/dL
Nitrite: NEGATIVE
Protein, ur: 30 mg/dL — AB
Specific Gravity, Urine: 1.018 (ref 1.005–1.030)
pH: 5 (ref 5.0–8.0)

## 2018-10-15 LAB — MAGNESIUM: Magnesium: 2 mg/dL (ref 1.7–2.4)

## 2018-10-16 DIAGNOSIS — D631 Anemia in chronic kidney disease: Secondary | ICD-10-CM | POA: Diagnosis not present

## 2018-10-16 DIAGNOSIS — N179 Acute kidney failure, unspecified: Secondary | ICD-10-CM | POA: Diagnosis not present

## 2018-10-16 DIAGNOSIS — K56609 Unspecified intestinal obstruction, unspecified as to partial versus complete obstruction: Secondary | ICD-10-CM | POA: Diagnosis not present

## 2018-10-16 DIAGNOSIS — J9600 Acute respiratory failure, unspecified whether with hypoxia or hypercapnia: Secondary | ICD-10-CM | POA: Diagnosis not present

## 2018-10-16 LAB — CBC
HCT: 35 % — ABNORMAL LOW (ref 36.0–46.0)
Hemoglobin: 10.7 g/dL — ABNORMAL LOW (ref 12.0–15.0)
MCH: 28.1 pg (ref 26.0–34.0)
MCHC: 30.6 g/dL (ref 30.0–36.0)
MCV: 91.9 fL (ref 80.0–100.0)
Platelets: 270 10*3/uL (ref 150–400)
RBC: 3.81 MIL/uL — ABNORMAL LOW (ref 3.87–5.11)
RDW: 15.7 % — ABNORMAL HIGH (ref 11.5–15.5)
WBC: 7.2 10*3/uL (ref 4.0–10.5)
nRBC: 0 % (ref 0.0–0.2)

## 2018-10-16 LAB — RENAL FUNCTION PANEL
Albumin: 1.7 g/dL — ABNORMAL LOW (ref 3.5–5.0)
Anion gap: 12 (ref 5–15)
BUN: 21 mg/dL (ref 8–23)
CO2: 21 mmol/L — ABNORMAL LOW (ref 22–32)
Calcium: 8.5 mg/dL — ABNORMAL LOW (ref 8.9–10.3)
Chloride: 104 mmol/L (ref 98–111)
Creatinine, Ser: 1.54 mg/dL — ABNORMAL HIGH (ref 0.44–1.00)
GFR calc Af Amer: 37 mL/min — ABNORMAL LOW (ref >60)
GFR calc non Af Amer: 32 mL/min — ABNORMAL LOW (ref >60)
Glucose, Bld: 86 mg/dL (ref 70–99)
Phosphorus: 3 mg/dL (ref 2.5–4.6)
Potassium: 4.1 mmol/L (ref 3.5–5.1)
Sodium: 137 mmol/L (ref 135–145)

## 2018-10-16 LAB — PROCALCITONIN: Procalcitonin: 1.01 ng/mL

## 2018-10-16 LAB — MAGNESIUM: Magnesium: 2 mg/dL (ref 1.7–2.4)

## 2018-10-17 DIAGNOSIS — J9600 Acute respiratory failure, unspecified whether with hypoxia or hypercapnia: Secondary | ICD-10-CM | POA: Diagnosis not present

## 2018-10-17 DIAGNOSIS — N179 Acute kidney failure, unspecified: Secondary | ICD-10-CM | POA: Diagnosis not present

## 2018-10-17 DIAGNOSIS — D631 Anemia in chronic kidney disease: Secondary | ICD-10-CM | POA: Diagnosis not present

## 2018-10-17 DIAGNOSIS — K56609 Unspecified intestinal obstruction, unspecified as to partial versus complete obstruction: Secondary | ICD-10-CM | POA: Diagnosis not present

## 2018-10-17 LAB — RENAL FUNCTION PANEL
Albumin: 1.3 g/dL — ABNORMAL LOW (ref 3.5–5.0)
Anion gap: 9 (ref 5–15)
BUN: 18 mg/dL (ref 8–23)
CO2: 21 mmol/L — ABNORMAL LOW (ref 22–32)
Calcium: 7.5 mg/dL — ABNORMAL LOW (ref 8.9–10.3)
Chloride: 107 mmol/L (ref 98–111)
Creatinine, Ser: 1.38 mg/dL — ABNORMAL HIGH (ref 0.44–1.00)
GFR calc Af Amer: 42 mL/min — ABNORMAL LOW (ref >60)
GFR calc non Af Amer: 36 mL/min — ABNORMAL LOW (ref >60)
Glucose, Bld: 58 mg/dL — ABNORMAL LOW (ref 70–99)
Phosphorus: 2.5 mg/dL (ref 2.5–4.6)
Potassium: 4 mmol/L (ref 3.5–5.1)
Sodium: 137 mmol/L (ref 135–145)

## 2018-10-17 LAB — CBC
HCT: 26.4 % — ABNORMAL LOW (ref 36.0–46.0)
Hemoglobin: 7.9 g/dL — ABNORMAL LOW (ref 12.0–15.0)
MCH: 28 pg (ref 26.0–34.0)
MCHC: 29.9 g/dL — ABNORMAL LOW (ref 30.0–36.0)
MCV: 93.6 fL (ref 80.0–100.0)
Platelets: 249 10*3/uL (ref 150–400)
RBC: 2.82 MIL/uL — ABNORMAL LOW (ref 3.87–5.11)
RDW: 15.7 % — ABNORMAL HIGH (ref 11.5–15.5)
WBC: 8 10*3/uL (ref 4.0–10.5)
nRBC: 0 % (ref 0.0–0.2)

## 2018-10-17 LAB — MAGNESIUM
Magnesium: 1.7 mg/dL (ref 1.7–2.4)
Magnesium: 2.2 mg/dL (ref 1.7–2.4)

## 2018-10-17 LAB — LACTIC ACID, PLASMA: Lactic Acid, Venous: 0.9 mmol/L (ref 0.5–1.9)

## 2018-10-18 DIAGNOSIS — D631 Anemia in chronic kidney disease: Secondary | ICD-10-CM | POA: Diagnosis not present

## 2018-10-18 DIAGNOSIS — N179 Acute kidney failure, unspecified: Secondary | ICD-10-CM | POA: Diagnosis not present

## 2018-10-18 DIAGNOSIS — K56609 Unspecified intestinal obstruction, unspecified as to partial versus complete obstruction: Secondary | ICD-10-CM | POA: Diagnosis not present

## 2018-10-18 DIAGNOSIS — J9600 Acute respiratory failure, unspecified whether with hypoxia or hypercapnia: Secondary | ICD-10-CM | POA: Diagnosis not present

## 2018-10-18 LAB — URINE CULTURE: Culture: 90000 — AB

## 2018-10-19 DIAGNOSIS — J9600 Acute respiratory failure, unspecified whether with hypoxia or hypercapnia: Secondary | ICD-10-CM | POA: Diagnosis not present

## 2018-10-19 DIAGNOSIS — D631 Anemia in chronic kidney disease: Secondary | ICD-10-CM | POA: Diagnosis not present

## 2018-10-19 DIAGNOSIS — K56609 Unspecified intestinal obstruction, unspecified as to partial versus complete obstruction: Secondary | ICD-10-CM | POA: Diagnosis not present

## 2018-10-19 DIAGNOSIS — N179 Acute kidney failure, unspecified: Secondary | ICD-10-CM | POA: Diagnosis not present

## 2018-10-19 LAB — BASIC METABOLIC PANEL
Anion gap: 9 (ref 5–15)
BUN: 12 mg/dL (ref 8–23)
CO2: 21 mmol/L — ABNORMAL LOW (ref 22–32)
Calcium: 7.9 mg/dL — ABNORMAL LOW (ref 8.9–10.3)
Chloride: 110 mmol/L (ref 98–111)
Creatinine, Ser: 1.28 mg/dL — ABNORMAL HIGH (ref 0.44–1.00)
GFR calc Af Amer: 46 mL/min — ABNORMAL LOW (ref >60)
GFR calc non Af Amer: 39 mL/min — ABNORMAL LOW (ref >60)
Glucose, Bld: 58 mg/dL — ABNORMAL LOW (ref 70–99)
Potassium: 4.1 mmol/L (ref 3.5–5.1)
Sodium: 140 mmol/L (ref 135–145)

## 2018-10-19 LAB — CBC
HCT: 31 % — ABNORMAL LOW (ref 36.0–46.0)
Hemoglobin: 9.3 g/dL — ABNORMAL LOW (ref 12.0–15.0)
MCH: 27.9 pg (ref 26.0–34.0)
MCHC: 30 g/dL (ref 30.0–36.0)
MCV: 93.1 fL (ref 80.0–100.0)
Platelets: 268 10*3/uL (ref 150–400)
RBC: 3.33 MIL/uL — ABNORMAL LOW (ref 3.87–5.11)
RDW: 15.9 % — ABNORMAL HIGH (ref 11.5–15.5)
WBC: 7 10*3/uL (ref 4.0–10.5)
nRBC: 0 % (ref 0.0–0.2)

## 2018-10-19 LAB — MAGNESIUM: Magnesium: 1.8 mg/dL (ref 1.7–2.4)

## 2018-10-19 LAB — OCCULT BLOOD X 1 CARD TO LAB, STOOL: Fecal Occult Bld: NEGATIVE

## 2018-10-20 DIAGNOSIS — K56609 Unspecified intestinal obstruction, unspecified as to partial versus complete obstruction: Secondary | ICD-10-CM | POA: Diagnosis not present

## 2018-10-20 DIAGNOSIS — J9600 Acute respiratory failure, unspecified whether with hypoxia or hypercapnia: Secondary | ICD-10-CM | POA: Diagnosis not present

## 2018-10-20 DIAGNOSIS — D631 Anemia in chronic kidney disease: Secondary | ICD-10-CM | POA: Diagnosis not present

## 2018-10-20 DIAGNOSIS — N179 Acute kidney failure, unspecified: Secondary | ICD-10-CM | POA: Diagnosis not present

## 2018-10-20 LAB — CBC
HCT: 28 % — ABNORMAL LOW (ref 36.0–46.0)
Hemoglobin: 8.4 g/dL — ABNORMAL LOW (ref 12.0–15.0)
MCH: 27.5 pg (ref 26.0–34.0)
MCHC: 30 g/dL (ref 30.0–36.0)
MCV: 91.5 fL (ref 80.0–100.0)
Platelets: 289 10*3/uL (ref 150–400)
RBC: 3.06 MIL/uL — ABNORMAL LOW (ref 3.87–5.11)
RDW: 15.9 % — ABNORMAL HIGH (ref 11.5–15.5)
WBC: 7.4 10*3/uL (ref 4.0–10.5)
nRBC: 0 % (ref 0.0–0.2)

## 2018-10-20 LAB — BASIC METABOLIC PANEL
Anion gap: 11 (ref 5–15)
BUN: 13 mg/dL (ref 8–23)
CO2: 20 mmol/L — ABNORMAL LOW (ref 22–32)
Calcium: 7.8 mg/dL — ABNORMAL LOW (ref 8.9–10.3)
Chloride: 112 mmol/L — ABNORMAL HIGH (ref 98–111)
Creatinine, Ser: 1.21 mg/dL — ABNORMAL HIGH (ref 0.44–1.00)
GFR calc Af Amer: 49 mL/min — ABNORMAL LOW (ref >60)
GFR calc non Af Amer: 42 mL/min — ABNORMAL LOW (ref >60)
Glucose, Bld: 79 mg/dL (ref 70–99)
Potassium: 3.3 mmol/L — ABNORMAL LOW (ref 3.5–5.1)
Sodium: 143 mmol/L (ref 135–145)

## 2018-10-20 LAB — MAGNESIUM: Magnesium: 1.6 mg/dL — ABNORMAL LOW (ref 1.7–2.4)

## 2018-10-21 ENCOUNTER — Encounter: Payer: Medicare Other | Admitting: *Deleted

## 2018-10-21 DIAGNOSIS — J9600 Acute respiratory failure, unspecified whether with hypoxia or hypercapnia: Secondary | ICD-10-CM | POA: Diagnosis not present

## 2018-10-21 DIAGNOSIS — K56609 Unspecified intestinal obstruction, unspecified as to partial versus complete obstruction: Secondary | ICD-10-CM | POA: Diagnosis not present

## 2018-10-21 DIAGNOSIS — D631 Anemia in chronic kidney disease: Secondary | ICD-10-CM | POA: Diagnosis not present

## 2018-10-21 DIAGNOSIS — N179 Acute kidney failure, unspecified: Secondary | ICD-10-CM | POA: Diagnosis not present

## 2018-10-21 LAB — POTASSIUM: Potassium: 4 mmol/L (ref 3.5–5.1)

## 2018-10-21 LAB — MAGNESIUM: Magnesium: 2.2 mg/dL (ref 1.7–2.4)

## 2018-10-21 NOTE — Progress Notes (Signed)
No ICM remote transmission received for 10/21/2018 due to hospitalization and next ICM transmission scheduled for 11/10/2018.

## 2018-10-22 ENCOUNTER — Telehealth: Payer: Self-pay

## 2018-10-22 DIAGNOSIS — K56609 Unspecified intestinal obstruction, unspecified as to partial versus complete obstruction: Secondary | ICD-10-CM | POA: Diagnosis not present

## 2018-10-22 DIAGNOSIS — N179 Acute kidney failure, unspecified: Secondary | ICD-10-CM | POA: Diagnosis not present

## 2018-10-22 DIAGNOSIS — D631 Anemia in chronic kidney disease: Secondary | ICD-10-CM | POA: Diagnosis not present

## 2018-10-22 DIAGNOSIS — J9600 Acute respiratory failure, unspecified whether with hypoxia or hypercapnia: Secondary | ICD-10-CM | POA: Diagnosis not present

## 2018-10-23 DIAGNOSIS — D631 Anemia in chronic kidney disease: Secondary | ICD-10-CM | POA: Diagnosis not present

## 2018-10-23 DIAGNOSIS — K56609 Unspecified intestinal obstruction, unspecified as to partial versus complete obstruction: Secondary | ICD-10-CM | POA: Diagnosis not present

## 2018-10-23 DIAGNOSIS — N179 Acute kidney failure, unspecified: Secondary | ICD-10-CM | POA: Diagnosis not present

## 2018-10-23 DIAGNOSIS — J9600 Acute respiratory failure, unspecified whether with hypoxia or hypercapnia: Secondary | ICD-10-CM | POA: Diagnosis not present

## 2018-10-24 DIAGNOSIS — J9600 Acute respiratory failure, unspecified whether with hypoxia or hypercapnia: Secondary | ICD-10-CM | POA: Diagnosis not present

## 2018-10-24 DIAGNOSIS — D631 Anemia in chronic kidney disease: Secondary | ICD-10-CM | POA: Diagnosis not present

## 2018-10-24 DIAGNOSIS — K56609 Unspecified intestinal obstruction, unspecified as to partial versus complete obstruction: Secondary | ICD-10-CM | POA: Diagnosis not present

## 2018-10-24 DIAGNOSIS — N179 Acute kidney failure, unspecified: Secondary | ICD-10-CM | POA: Diagnosis not present

## 2018-10-24 LAB — CBC
HCT: 27.3 % — ABNORMAL LOW (ref 36.0–46.0)
Hemoglobin: 8.4 g/dL — ABNORMAL LOW (ref 12.0–15.0)
MCH: 27.7 pg (ref 26.0–34.0)
MCHC: 30.8 g/dL (ref 30.0–36.0)
MCV: 90.1 fL (ref 80.0–100.0)
Platelets: 307 10*3/uL (ref 150–400)
RBC: 3.03 MIL/uL — ABNORMAL LOW (ref 3.87–5.11)
RDW: 16 % — ABNORMAL HIGH (ref 11.5–15.5)
WBC: 6.5 10*3/uL (ref 4.0–10.5)
nRBC: 0 % (ref 0.0–0.2)

## 2018-10-24 LAB — COMPREHENSIVE METABOLIC PANEL WITH GFR
ALT: 24 U/L (ref 0–44)
AST: 36 U/L (ref 15–41)
Albumin: 1.2 g/dL — ABNORMAL LOW (ref 3.5–5.0)
Alkaline Phosphatase: 423 U/L — ABNORMAL HIGH (ref 38–126)
Anion gap: 10 (ref 5–15)
BUN: 14 mg/dL (ref 8–23)
CO2: 21 mmol/L — ABNORMAL LOW (ref 22–32)
Calcium: 7.8 mg/dL — ABNORMAL LOW (ref 8.9–10.3)
Chloride: 110 mmol/L (ref 98–111)
Creatinine, Ser: 1.25 mg/dL — ABNORMAL HIGH (ref 0.44–1.00)
GFR calc Af Amer: 47 mL/min — ABNORMAL LOW
GFR calc non Af Amer: 41 mL/min — ABNORMAL LOW
Glucose, Bld: 135 mg/dL — ABNORMAL HIGH (ref 70–99)
Potassium: 3.8 mmol/L (ref 3.5–5.1)
Sodium: 141 mmol/L (ref 135–145)
Total Bilirubin: 0.8 mg/dL (ref 0.3–1.2)
Total Protein: 5.2 g/dL — ABNORMAL LOW (ref 6.5–8.1)

## 2018-10-24 LAB — MAGNESIUM: Magnesium: 1.5 mg/dL — ABNORMAL LOW (ref 1.7–2.4)

## 2018-10-25 DIAGNOSIS — N179 Acute kidney failure, unspecified: Secondary | ICD-10-CM | POA: Diagnosis not present

## 2018-10-25 DIAGNOSIS — D631 Anemia in chronic kidney disease: Secondary | ICD-10-CM | POA: Diagnosis not present

## 2018-10-25 DIAGNOSIS — K56609 Unspecified intestinal obstruction, unspecified as to partial versus complete obstruction: Secondary | ICD-10-CM | POA: Diagnosis not present

## 2018-10-25 DIAGNOSIS — J9600 Acute respiratory failure, unspecified whether with hypoxia or hypercapnia: Secondary | ICD-10-CM | POA: Diagnosis not present

## 2018-10-25 LAB — MAGNESIUM: Magnesium: 2.2 mg/dL (ref 1.7–2.4)

## 2018-10-26 DIAGNOSIS — I509 Heart failure, unspecified: Secondary | ICD-10-CM | POA: Diagnosis not present

## 2018-10-26 DIAGNOSIS — J9601 Acute respiratory failure with hypoxia: Secondary | ICD-10-CM | POA: Diagnosis not present

## 2018-10-26 DIAGNOSIS — R1310 Dysphagia, unspecified: Secondary | ICD-10-CM | POA: Diagnosis not present

## 2018-10-26 DIAGNOSIS — Z95810 Presence of automatic (implantable) cardiac defibrillator: Secondary | ICD-10-CM | POA: Diagnosis not present

## 2018-10-26 DIAGNOSIS — I129 Hypertensive chronic kidney disease with stage 1 through stage 4 chronic kidney disease, or unspecified chronic kidney disease: Secondary | ICD-10-CM | POA: Diagnosis not present

## 2018-10-26 DIAGNOSIS — Z760 Encounter for issue of repeat prescription: Secondary | ICD-10-CM | POA: Diagnosis not present

## 2018-10-26 DIAGNOSIS — Z4800 Encounter for change or removal of nonsurgical wound dressing: Secondary | ICD-10-CM | POA: Diagnosis not present

## 2018-10-26 DIAGNOSIS — K219 Gastro-esophageal reflux disease without esophagitis: Secondary | ICD-10-CM | POA: Diagnosis not present

## 2018-10-26 DIAGNOSIS — N183 Chronic kidney disease, stage 3 (moderate): Secondary | ICD-10-CM | POA: Diagnosis not present

## 2018-10-26 DIAGNOSIS — K913 Postprocedural intestinal obstruction, unspecified as to partial versus complete: Secondary | ICD-10-CM | POA: Diagnosis not present

## 2018-10-26 DIAGNOSIS — L89312 Pressure ulcer of right buttock, stage 2: Secondary | ICD-10-CM | POA: Diagnosis not present

## 2018-10-26 DIAGNOSIS — M6281 Muscle weakness (generalized): Secondary | ICD-10-CM | POA: Diagnosis not present

## 2018-10-26 DIAGNOSIS — D638 Anemia in other chronic diseases classified elsewhere: Secondary | ICD-10-CM | POA: Diagnosis not present

## 2018-10-26 DIAGNOSIS — E871 Hypo-osmolality and hyponatremia: Secondary | ICD-10-CM | POA: Diagnosis not present

## 2018-10-26 DIAGNOSIS — J42 Unspecified chronic bronchitis: Secondary | ICD-10-CM | POA: Diagnosis not present

## 2018-10-26 DIAGNOSIS — Z853 Personal history of malignant neoplasm of breast: Secondary | ICD-10-CM | POA: Diagnosis not present

## 2018-10-26 DIAGNOSIS — J189 Pneumonia, unspecified organism: Secondary | ICD-10-CM | POA: Diagnosis not present

## 2018-10-26 DIAGNOSIS — R945 Abnormal results of liver function studies: Secondary | ICD-10-CM | POA: Diagnosis not present

## 2018-10-26 DIAGNOSIS — E46 Unspecified protein-calorie malnutrition: Secondary | ICD-10-CM | POA: Diagnosis not present

## 2018-10-28 DIAGNOSIS — Z760 Encounter for issue of repeat prescription: Secondary | ICD-10-CM | POA: Diagnosis not present

## 2018-10-28 DIAGNOSIS — I129 Hypertensive chronic kidney disease with stage 1 through stage 4 chronic kidney disease, or unspecified chronic kidney disease: Secondary | ICD-10-CM | POA: Diagnosis not present

## 2018-10-28 DIAGNOSIS — E871 Hypo-osmolality and hyponatremia: Secondary | ICD-10-CM | POA: Diagnosis not present

## 2018-10-28 DIAGNOSIS — E46 Unspecified protein-calorie malnutrition: Secondary | ICD-10-CM | POA: Diagnosis not present

## 2018-10-28 DIAGNOSIS — N183 Chronic kidney disease, stage 3 (moderate): Secondary | ICD-10-CM | POA: Diagnosis not present

## 2018-10-28 DIAGNOSIS — J189 Pneumonia, unspecified organism: Secondary | ICD-10-CM | POA: Diagnosis not present

## 2018-10-28 DIAGNOSIS — Z4800 Encounter for change or removal of nonsurgical wound dressing: Secondary | ICD-10-CM | POA: Diagnosis not present

## 2018-10-28 DIAGNOSIS — R1310 Dysphagia, unspecified: Secondary | ICD-10-CM | POA: Diagnosis not present

## 2018-10-28 DIAGNOSIS — K219 Gastro-esophageal reflux disease without esophagitis: Secondary | ICD-10-CM | POA: Diagnosis not present

## 2018-10-28 DIAGNOSIS — J9601 Acute respiratory failure with hypoxia: Secondary | ICD-10-CM | POA: Diagnosis not present

## 2018-10-28 DIAGNOSIS — I509 Heart failure, unspecified: Secondary | ICD-10-CM | POA: Diagnosis not present

## 2018-10-28 DIAGNOSIS — Z853 Personal history of malignant neoplasm of breast: Secondary | ICD-10-CM | POA: Diagnosis not present

## 2018-10-28 DIAGNOSIS — J42 Unspecified chronic bronchitis: Secondary | ICD-10-CM | POA: Diagnosis not present

## 2018-10-28 DIAGNOSIS — D638 Anemia in other chronic diseases classified elsewhere: Secondary | ICD-10-CM | POA: Diagnosis not present

## 2018-10-28 DIAGNOSIS — M6281 Muscle weakness (generalized): Secondary | ICD-10-CM | POA: Diagnosis not present

## 2018-10-28 DIAGNOSIS — K913 Postprocedural intestinal obstruction, unspecified as to partial versus complete: Secondary | ICD-10-CM | POA: Diagnosis not present

## 2018-10-28 DIAGNOSIS — R945 Abnormal results of liver function studies: Secondary | ICD-10-CM | POA: Diagnosis not present

## 2018-10-28 DIAGNOSIS — Z95810 Presence of automatic (implantable) cardiac defibrillator: Secondary | ICD-10-CM | POA: Diagnosis not present

## 2018-10-28 DIAGNOSIS — L89312 Pressure ulcer of right buttock, stage 2: Secondary | ICD-10-CM | POA: Diagnosis not present

## 2018-10-29 DIAGNOSIS — J9601 Acute respiratory failure with hypoxia: Secondary | ICD-10-CM | POA: Diagnosis not present

## 2018-10-29 DIAGNOSIS — Z95810 Presence of automatic (implantable) cardiac defibrillator: Secondary | ICD-10-CM | POA: Diagnosis not present

## 2018-10-29 DIAGNOSIS — S31109A Unspecified open wound of abdominal wall, unspecified quadrant without penetration into peritoneal cavity, initial encounter: Secondary | ICD-10-CM | POA: Diagnosis not present

## 2018-10-29 DIAGNOSIS — Z853 Personal history of malignant neoplasm of breast: Secondary | ICD-10-CM | POA: Diagnosis not present

## 2018-10-29 DIAGNOSIS — S51801A Unspecified open wound of right forearm, initial encounter: Secondary | ICD-10-CM | POA: Diagnosis not present

## 2018-10-29 DIAGNOSIS — Z760 Encounter for issue of repeat prescription: Secondary | ICD-10-CM | POA: Diagnosis not present

## 2018-10-29 DIAGNOSIS — J42 Unspecified chronic bronchitis: Secondary | ICD-10-CM | POA: Diagnosis not present

## 2018-10-29 DIAGNOSIS — L89312 Pressure ulcer of right buttock, stage 2: Secondary | ICD-10-CM | POA: Diagnosis not present

## 2018-10-29 DIAGNOSIS — E871 Hypo-osmolality and hyponatremia: Secondary | ICD-10-CM | POA: Diagnosis not present

## 2018-10-29 DIAGNOSIS — I129 Hypertensive chronic kidney disease with stage 1 through stage 4 chronic kidney disease, or unspecified chronic kidney disease: Secondary | ICD-10-CM | POA: Diagnosis not present

## 2018-10-29 DIAGNOSIS — S31102A Unspecified open wound of abdominal wall, epigastric region without penetration into peritoneal cavity, initial encounter: Secondary | ICD-10-CM | POA: Diagnosis not present

## 2018-10-29 DIAGNOSIS — Z4800 Encounter for change or removal of nonsurgical wound dressing: Secondary | ICD-10-CM | POA: Diagnosis not present

## 2018-10-29 DIAGNOSIS — M6281 Muscle weakness (generalized): Secondary | ICD-10-CM | POA: Diagnosis not present

## 2018-10-29 DIAGNOSIS — I509 Heart failure, unspecified: Secondary | ICD-10-CM | POA: Diagnosis not present

## 2018-10-29 DIAGNOSIS — K219 Gastro-esophageal reflux disease without esophagitis: Secondary | ICD-10-CM | POA: Diagnosis not present

## 2018-10-29 DIAGNOSIS — E46 Unspecified protein-calorie malnutrition: Secondary | ICD-10-CM | POA: Diagnosis not present

## 2018-10-29 DIAGNOSIS — K56699 Other intestinal obstruction unspecified as to partial versus complete obstruction: Secondary | ICD-10-CM | POA: Diagnosis not present

## 2018-10-29 DIAGNOSIS — R1310 Dysphagia, unspecified: Secondary | ICD-10-CM | POA: Diagnosis not present

## 2018-10-29 DIAGNOSIS — K913 Postprocedural intestinal obstruction, unspecified as to partial versus complete: Secondary | ICD-10-CM | POA: Diagnosis not present

## 2018-10-29 DIAGNOSIS — D638 Anemia in other chronic diseases classified elsewhere: Secondary | ICD-10-CM | POA: Diagnosis not present

## 2018-10-29 DIAGNOSIS — N183 Chronic kidney disease, stage 3 (moderate): Secondary | ICD-10-CM | POA: Diagnosis not present

## 2018-10-29 DIAGNOSIS — J9600 Acute respiratory failure, unspecified whether with hypoxia or hypercapnia: Secondary | ICD-10-CM | POA: Diagnosis not present

## 2018-10-29 DIAGNOSIS — R945 Abnormal results of liver function studies: Secondary | ICD-10-CM | POA: Diagnosis not present

## 2018-10-29 DIAGNOSIS — J189 Pneumonia, unspecified organism: Secondary | ICD-10-CM | POA: Diagnosis not present

## 2018-10-30 DIAGNOSIS — N183 Chronic kidney disease, stage 3 (moderate): Secondary | ICD-10-CM | POA: Diagnosis not present

## 2018-10-30 DIAGNOSIS — J9601 Acute respiratory failure with hypoxia: Secondary | ICD-10-CM | POA: Diagnosis not present

## 2018-10-30 DIAGNOSIS — K219 Gastro-esophageal reflux disease without esophagitis: Secondary | ICD-10-CM | POA: Diagnosis not present

## 2018-10-30 DIAGNOSIS — Z760 Encounter for issue of repeat prescription: Secondary | ICD-10-CM | POA: Diagnosis not present

## 2018-10-30 DIAGNOSIS — E871 Hypo-osmolality and hyponatremia: Secondary | ICD-10-CM | POA: Diagnosis not present

## 2018-10-30 DIAGNOSIS — I129 Hypertensive chronic kidney disease with stage 1 through stage 4 chronic kidney disease, or unspecified chronic kidney disease: Secondary | ICD-10-CM | POA: Diagnosis not present

## 2018-10-30 DIAGNOSIS — Z853 Personal history of malignant neoplasm of breast: Secondary | ICD-10-CM | POA: Diagnosis not present

## 2018-10-30 DIAGNOSIS — Z4800 Encounter for change or removal of nonsurgical wound dressing: Secondary | ICD-10-CM | POA: Diagnosis not present

## 2018-10-30 DIAGNOSIS — R1310 Dysphagia, unspecified: Secondary | ICD-10-CM | POA: Diagnosis not present

## 2018-10-30 DIAGNOSIS — E46 Unspecified protein-calorie malnutrition: Secondary | ICD-10-CM | POA: Diagnosis not present

## 2018-10-30 DIAGNOSIS — Z95810 Presence of automatic (implantable) cardiac defibrillator: Secondary | ICD-10-CM | POA: Diagnosis not present

## 2018-10-30 DIAGNOSIS — L89312 Pressure ulcer of right buttock, stage 2: Secondary | ICD-10-CM | POA: Diagnosis not present

## 2018-10-30 DIAGNOSIS — J42 Unspecified chronic bronchitis: Secondary | ICD-10-CM | POA: Diagnosis not present

## 2018-10-30 DIAGNOSIS — D638 Anemia in other chronic diseases classified elsewhere: Secondary | ICD-10-CM | POA: Diagnosis not present

## 2018-10-30 DIAGNOSIS — I509 Heart failure, unspecified: Secondary | ICD-10-CM | POA: Diagnosis not present

## 2018-10-30 DIAGNOSIS — J189 Pneumonia, unspecified organism: Secondary | ICD-10-CM | POA: Diagnosis not present

## 2018-10-30 DIAGNOSIS — R945 Abnormal results of liver function studies: Secondary | ICD-10-CM | POA: Diagnosis not present

## 2018-10-30 DIAGNOSIS — K913 Postprocedural intestinal obstruction, unspecified as to partial versus complete: Secondary | ICD-10-CM | POA: Diagnosis not present

## 2018-10-30 DIAGNOSIS — M6281 Muscle weakness (generalized): Secondary | ICD-10-CM | POA: Diagnosis not present

## 2018-10-31 ENCOUNTER — Emergency Department (HOSPITAL_COMMUNITY)
Admission: EM | Admit: 2018-10-31 | Discharge: 2018-11-01 | Disposition: A | Discharge status: Home / Self Care | Payer: Medicare Other | Attending: Emergency Medicine | Admitting: Emergency Medicine

## 2018-10-31 ENCOUNTER — Other Ambulatory Visit: Payer: Self-pay

## 2018-10-31 ENCOUNTER — Encounter (HOSPITAL_COMMUNITY): Payer: Self-pay | Admitting: *Deleted

## 2018-10-31 ENCOUNTER — Emergency Department (HOSPITAL_COMMUNITY): Payer: Medicare Other

## 2018-10-31 DIAGNOSIS — N183 Chronic kidney disease, stage 3 (moderate): Secondary | ICD-10-CM | POA: Insufficient documentation

## 2018-10-31 DIAGNOSIS — R945 Abnormal results of liver function studies: Secondary | ICD-10-CM | POA: Diagnosis not present

## 2018-10-31 DIAGNOSIS — E46 Unspecified protein-calorie malnutrition: Secondary | ICD-10-CM | POA: Diagnosis not present

## 2018-10-31 DIAGNOSIS — I2510 Atherosclerotic heart disease of native coronary artery without angina pectoris: Secondary | ICD-10-CM | POA: Insufficient documentation

## 2018-10-31 DIAGNOSIS — E1122 Type 2 diabetes mellitus with diabetic chronic kidney disease: Secondary | ICD-10-CM | POA: Insufficient documentation

## 2018-10-31 DIAGNOSIS — Z4800 Encounter for change or removal of nonsurgical wound dressing: Secondary | ICD-10-CM | POA: Diagnosis not present

## 2018-10-31 DIAGNOSIS — Z20828 Contact with and (suspected) exposure to other viral communicable diseases: Secondary | ICD-10-CM | POA: Insufficient documentation

## 2018-10-31 DIAGNOSIS — R918 Other nonspecific abnormal finding of lung field: Secondary | ICD-10-CM | POA: Diagnosis not present

## 2018-10-31 DIAGNOSIS — K913 Postprocedural intestinal obstruction, unspecified as to partial versus complete: Secondary | ICD-10-CM | POA: Diagnosis not present

## 2018-10-31 DIAGNOSIS — R1310 Dysphagia, unspecified: Secondary | ICD-10-CM | POA: Diagnosis not present

## 2018-10-31 DIAGNOSIS — Z794 Long term (current) use of insulin: Secondary | ICD-10-CM | POA: Insufficient documentation

## 2018-10-31 DIAGNOSIS — N390 Urinary tract infection, site not specified: Secondary | ICD-10-CM | POA: Diagnosis not present

## 2018-10-31 DIAGNOSIS — I5042 Chronic combined systolic (congestive) and diastolic (congestive) heart failure: Secondary | ICD-10-CM | POA: Insufficient documentation

## 2018-10-31 DIAGNOSIS — I130 Hypertensive heart and chronic kidney disease with heart failure and stage 1 through stage 4 chronic kidney disease, or unspecified chronic kidney disease: Secondary | ICD-10-CM | POA: Insufficient documentation

## 2018-10-31 DIAGNOSIS — E876 Hypokalemia: Secondary | ICD-10-CM

## 2018-10-31 DIAGNOSIS — I129 Hypertensive chronic kidney disease with stage 1 through stage 4 chronic kidney disease, or unspecified chronic kidney disease: Secondary | ICD-10-CM | POA: Diagnosis not present

## 2018-10-31 DIAGNOSIS — Z853 Personal history of malignant neoplasm of breast: Secondary | ICD-10-CM | POA: Diagnosis not present

## 2018-10-31 DIAGNOSIS — Z79899 Other long term (current) drug therapy: Secondary | ICD-10-CM | POA: Diagnosis not present

## 2018-10-31 DIAGNOSIS — L89312 Pressure ulcer of right buttock, stage 2: Secondary | ICD-10-CM | POA: Diagnosis not present

## 2018-10-31 DIAGNOSIS — R509 Fever, unspecified: Secondary | ICD-10-CM | POA: Diagnosis present

## 2018-10-31 DIAGNOSIS — J42 Unspecified chronic bronchitis: Secondary | ICD-10-CM | POA: Diagnosis not present

## 2018-10-31 DIAGNOSIS — J9601 Acute respiratory failure with hypoxia: Secondary | ICD-10-CM | POA: Diagnosis not present

## 2018-10-31 DIAGNOSIS — E871 Hypo-osmolality and hyponatremia: Secondary | ICD-10-CM | POA: Diagnosis not present

## 2018-10-31 DIAGNOSIS — J189 Pneumonia, unspecified organism: Secondary | ICD-10-CM | POA: Diagnosis not present

## 2018-10-31 DIAGNOSIS — Z760 Encounter for issue of repeat prescription: Secondary | ICD-10-CM | POA: Diagnosis not present

## 2018-10-31 DIAGNOSIS — Z96651 Presence of right artificial knee joint: Secondary | ICD-10-CM | POA: Insufficient documentation

## 2018-10-31 DIAGNOSIS — J90 Pleural effusion, not elsewhere classified: Secondary | ICD-10-CM | POA: Diagnosis not present

## 2018-10-31 DIAGNOSIS — I517 Cardiomegaly: Secondary | ICD-10-CM | POA: Diagnosis not present

## 2018-10-31 DIAGNOSIS — F17220 Nicotine dependence, chewing tobacco, uncomplicated: Secondary | ICD-10-CM | POA: Diagnosis not present

## 2018-10-31 DIAGNOSIS — D638 Anemia in other chronic diseases classified elsewhere: Secondary | ICD-10-CM | POA: Diagnosis not present

## 2018-10-31 DIAGNOSIS — K219 Gastro-esophageal reflux disease without esophagitis: Secondary | ICD-10-CM | POA: Diagnosis not present

## 2018-10-31 DIAGNOSIS — M6281 Muscle weakness (generalized): Secondary | ICD-10-CM | POA: Diagnosis not present

## 2018-10-31 DIAGNOSIS — Z95810 Presence of automatic (implantable) cardiac defibrillator: Secondary | ICD-10-CM | POA: Diagnosis not present

## 2018-10-31 DIAGNOSIS — J811 Chronic pulmonary edema: Secondary | ICD-10-CM | POA: Diagnosis not present

## 2018-10-31 DIAGNOSIS — I509 Heart failure, unspecified: Secondary | ICD-10-CM | POA: Diagnosis not present

## 2018-10-31 LAB — URINALYSIS, ROUTINE W REFLEX MICROSCOPIC
Glucose, UA: NEGATIVE mg/dL
Hgb urine dipstick: NEGATIVE
Ketones, ur: NEGATIVE mg/dL
Leukocytes,Ua: NEGATIVE
Nitrite: NEGATIVE
Protein, ur: 30 mg/dL — AB
Specific Gravity, Urine: 1.017 (ref 1.005–1.030)
pH: 5 (ref 5.0–8.0)

## 2018-10-31 LAB — CBC WITH DIFFERENTIAL/PLATELET
Abs Immature Granulocytes: 0.03 10*3/uL (ref 0.00–0.07)
Basophils Absolute: 0.1 10*3/uL (ref 0.0–0.1)
Basophils Relative: 1 %
Eosinophils Absolute: 0 10*3/uL (ref 0.0–0.5)
Eosinophils Relative: 0 %
HCT: 29.6 % — ABNORMAL LOW (ref 36.0–46.0)
Hemoglobin: 8.7 g/dL — ABNORMAL LOW (ref 12.0–15.0)
Immature Granulocytes: 0 %
Lymphocytes Relative: 28 %
Lymphs Abs: 2.4 10*3/uL (ref 0.7–4.0)
MCH: 26.7 pg (ref 26.0–34.0)
MCHC: 29.4 g/dL — ABNORMAL LOW (ref 30.0–36.0)
MCV: 90.8 fL (ref 80.0–100.0)
Monocytes Absolute: 0.7 10*3/uL (ref 0.1–1.0)
Monocytes Relative: 8 %
Neutro Abs: 5.5 10*3/uL (ref 1.7–7.7)
Neutrophils Relative %: 63 %
Platelets: 185 10*3/uL (ref 150–400)
RBC: 3.26 MIL/uL — ABNORMAL LOW (ref 3.87–5.11)
RDW: 16.7 % — ABNORMAL HIGH (ref 11.5–15.5)
WBC: 8.7 10*3/uL (ref 4.0–10.5)
nRBC: 0 % (ref 0.0–0.2)

## 2018-10-31 LAB — COMPREHENSIVE METABOLIC PANEL
ALT: 38 U/L (ref 0–44)
AST: 72 U/L — ABNORMAL HIGH (ref 15–41)
Albumin: 1.5 g/dL — ABNORMAL LOW (ref 3.5–5.0)
Alkaline Phosphatase: 587 U/L — ABNORMAL HIGH (ref 38–126)
Anion gap: 9 (ref 5–15)
BUN: 24 mg/dL — ABNORMAL HIGH (ref 8–23)
CO2: 21 mmol/L — ABNORMAL LOW (ref 22–32)
Calcium: 7.6 mg/dL — ABNORMAL LOW (ref 8.9–10.3)
Chloride: 117 mmol/L — ABNORMAL HIGH (ref 98–111)
Creatinine, Ser: 1.89 mg/dL — ABNORMAL HIGH (ref 0.44–1.00)
GFR calc Af Amer: 29 mL/min — ABNORMAL LOW (ref >60)
GFR calc non Af Amer: 25 mL/min — ABNORMAL LOW (ref >60)
Glucose, Bld: 82 mg/dL (ref 70–99)
Potassium: 3 mmol/L — ABNORMAL LOW (ref 3.5–5.1)
Sodium: 147 mmol/L — ABNORMAL HIGH (ref 135–145)
Total Bilirubin: 1.5 mg/dL — ABNORMAL HIGH (ref 0.3–1.2)
Total Protein: 5.9 g/dL — ABNORMAL LOW (ref 6.5–8.1)

## 2018-10-31 LAB — LACTIC ACID, PLASMA: Lactic Acid, Venous: 1.4 mmol/L (ref 0.5–1.9)

## 2018-10-31 LAB — SARS CORONAVIRUS 2 BY RT PCR (HOSPITAL ORDER, PERFORMED IN ~~LOC~~ HOSPITAL LAB): SARS Coronavirus 2: NEGATIVE

## 2018-10-31 MED ORDER — CEPHALEXIN 500 MG PO CAPS: 500.0000 mg | ORAL_CAPSULE | Freq: Four times a day (QID) | ORAL | 0 refills | Status: DC

## 2018-10-31 MED ORDER — POTASSIUM CHLORIDE CRYS ER 20 MEQ PO TBCR: 20.0000 meq | EXTENDED_RELEASE_TABLET | Freq: Every day | ORAL | 0 refills | Status: DC

## 2018-10-31 MED ORDER — SODIUM CHLORIDE 0.9 % IV BOLUS: 500.0000 mL | Freq: Once | INTRAVENOUS | Status: DC

## 2018-10-31 MED ORDER — POTASSIUM CHLORIDE CRYS ER 20 MEQ PO TBCR
40.0000 meq | EXTENDED_RELEASE_TABLET | Freq: Once | ORAL | Status: AC
  Administered 2018-10-31: 40 meq via ORAL
  Filled 2018-10-31: qty 2

## 2018-10-31 MED ORDER — STERILE WATER FOR INJECTION IJ SOLN
INTRAMUSCULAR | Status: AC
  Administered 2018-10-31: 2.1 mL
  Filled 2018-10-31: qty 10

## 2018-10-31 MED ORDER — CEFTRIAXONE SODIUM 1 G IJ SOLR
1.0000 g | Freq: Once | INTRAMUSCULAR | Status: AC
  Administered 2018-10-31: 1 g via INTRAMUSCULAR
  Filled 2018-10-31: qty 10

## 2018-10-31 NOTE — ED Notes (Addendum)
One set of blood cultures drawn by lab. Dr Roderic Palau said okay to hold off on IV at this time and just give fluids PO. Pt given 8oz  bottle of water and is drinking now with no difficulty. Pt left arm is restricted for blood draws and pt right arm has wound to forearm with limited access for PIV line.

## 2018-10-31 NOTE — ED Provider Notes (Signed)
[facility] EMERGENCY DEPARTMENT Provider Note   CSN: [id number] Arrival date & time: 10/31/18  2118     History   Chief Complaint Chief Complaint  Patient presents with  . Fever    HPI [name] is a 78 y.o. female.     Patient was sent to the emergency room for supposed fever patient states she did not have a fever  The history is provided by the patient. No language interpreter was used.  Fever Max temp prior to arrival:  101 Temp source:  Subjective Severity:  Mild Onset quality:  Sudden Timing:  Intermittent Progression:  Resolved Chronicity:  New Relieved by:  Nothing Worsened by:  Nothing Associated symptoms: no chest pain, no congestion, no cough, no diarrhea, no headaches and no rash     Past Medical History:  Diagnosis Date  . Anemia    Hgb of 9-10  . Anemia of chronic disease    Hgb of 9-10 chronically; 06/2010: H&H-10.7/33.5, MCV-81, normal iron studies in 2010   . Arteriosclerotic cardiovascular disease (ASCVD)    Remote PTCA by patient report; LBBB; associated cardiomyopathy, presumed ischemic with EF 40-45% previously, 20% in 06/2009; h/o clinical congestive heart failure; negative stress nuclear in 2009 with inferoseptal and apical scar  . Automatic implantable cardioverter-defibrillator in situ   . Breast cancer ([place])    breast  . Chronic bronchitis ([place])   . Chronic renal disease, stage 3, moderately decreased glomerular filtration rate (GFR) between 30-59 mL/min/1.73 square meter (HCC) 02/01/2016  . Congestive heart failure (CHF) ([place])   . Elevated cholesterol   . Elevated sed rate   . GERD (gastroesophageal reflux disease)   . GI bleed   . Gout   . HOH (hard of hearing)   . Hyperlipidemia   . Hypertension   . Rheumatoid arthritis ([place])   . UTI (urinary tract infection)     Patient Active Problem List   Diagnosis Date Noted  . Goals of care, counseling/discussion   . Palliative care by specialist   . Acute respiratory failure with  hypoxia ([place])   . Protein-calorie malnutrition, severe 08/25/2018  . Wide-complex tachycardia ([place])   . Acute renal failure superimposed on stage 3 chronic kidney disease ([place]) 08/18/2018  . SBO (small bowel obstruction) ([place]) 08/14/2018  . Pressure injury of skin 08/14/2018  . CAP (community acquired pneumonia) 08/13/2018  . AKI (acute kidney injury) ([place]) 08/10/2017  . Hyperkalemia 08/10/2017  . Acute blood loss anemia 09/20/2016  . Ischemic cardiomyopathy 09/20/2016  . Chronic combined systolic and diastolic CHF (congestive heart failure) ([place]) 09/20/2016  . CKD (chronic kidney disease), stage IV ([place]) 09/20/2016  . History of breast cancer 08/02/2016  . Primary osteoarthritis of both knees 08/02/2016  . Primary osteoarthritis of both feet 08/02/2016  . History of anemia 07/17/2016  . History of CHF (congestive heart failure) 06/04/2016  . History of chronic kidney disease 06/04/2016  . Symptomatic anemia 06/04/2016  . Elevated sedimentation rate 03/03/2016  . Idiopathic chronic gout of multiple sites without tophus 03/03/2016  . Total knee replacement status, right 03/03/2016  . High risk medication use 03/03/2016  . Chronic kidney disease (CKD) stage G3b/A1, moderately decreased glomerular filtration rate (GFR) between 30-44 mL/min/1.73 square meter and albuminuria creatinine ratio less than 30 mg/g (HCC) 02/01/2016  . CAD S/P remote PCI- no details 09/02/2014  . Cardiomyopathy, ischemic-EF 30-35% March 2015 09/02/2014  . Diastolic dysfunction-grade 2 09/02/2014  . CHF exacerbation ([place]) 08/28/2014  . Acute on chronic  combined systolic and diastolic congestive heart failure ([place]) 08/28/2014  . Iron deficiency anemia 08/20/2013  . Malnutrition of moderate degree ([place]) 07/21/2013  . PNA (pneumonia) 07/19/2013  . HCAP (healthcare-associated pneumonia) 07/17/2013  . Sepsis ([place]) 07/17/2013  . ARF (acute renal failure) ([place]) 07/17/2013  . Rheumatoid arthritis ([place]) 07/04/2013  .  Hypokalemia 07/03/2013  . Chest pain 07/03/2013  . Biventricular ICD in place (MDT 2014) 11/06/2012  . Anemia of chronic disease   . Breast cancer ([place])   . Hypertension   . Dyslipidemia 05/24/2009  . Chronic systolic heart failure ([place]) 05/24/2009  . Primary osteoarthritis of both hands 08/11/2007    Past Surgical History:  Procedure Laterality Date  . BI-VENTRICULAR IMPLANTABLE CARDIOVERTER DEFIBRILLATOR N/A 07/28/2012   Procedure: BI-VENTRICULAR IMPLANTABLE CARDIOVERTER DEFIBRILLATOR  (CRT-D);  Surgeon: Evans Lance, MD;  Location: [facility] CATH LAB;  Service: Cardiovascular;  Laterality: N/A;  . BI-VENTRICULAR IMPLANTABLE CARDIOVERTER DEFIBRILLATOR  (CRT-D)  07/28/2012  . BOWEL RESECTION N/A 08/21/2018   Procedure: Small Bowel Resection;  Surgeon: Jovita Kussmaul, MD;  Location: [place];  Service: General;  Laterality: N/A;  . BREAST BIOPSY Bilateral   . CATARACT EXTRACTION W/ INTRAOCULAR LENS IMPLANT Left   . CATARACT EXTRACTION W/PHACO Right 09/15/2013   Procedure: CATARACT EXTRACTION PHACO AND INTRAOCULAR LENS PLACEMENT (IOC);  Surgeon: Elta Guadeloupe T. Gershon Crane, MD;  Location: [facility];  Service: Ophthalmology;  Laterality: Right;  CDE:  10.74  . COLONOSCOPY N/A 03/04/2015   Procedure: COLONOSCOPY;  Surgeon: Rogene Houston, MD;  [facility] END[facility];  Service: Endoscopy;  Laterality: N/A;  10:50   . ESOPHAGOGASTRODUODENOSCOPY N/A 03/04/2015   Procedure: ESOPHAGOGASTRODUODENOSCOPY (EGD);  Surgeon: Rogene Houston, MD;  [facility];  Service: Endoscopy;  Laterality: N/A;  . ESOPHAGOGASTRODUODENOSCOPY N/A 09/21/2016   Procedure: ESOPHAGOGASTRODUODENOSCOPY (EGD);  Surgeon: Rogene Houston, MD;  [facility];  Service: Endoscopy;  Laterality: N/A;  . GIVENS CAPSULE STUDY N/A 09/22/2016   Procedure: GIVENS CAPSULE STUDY;  Surgeon: Rogene Houston, MD;  [facility];  Service: Endoscopy;  Laterality: N/A;  . LAPAROTOMY N/A 08/21/2018   Procedure: EXPLORATORY LAPAROTOMY WITH  LYSIS OF ADHESIONS;  Surgeon: Jovita Kussmaul, MD;  Location: [place];  Service: General;  Laterality: N/A;  . MASTECTOMY Left 1998  . TENDON TRANSFER Right 08/15/2017   Procedure: TENDON TRANSFER RIGHT WRIST EXTENSORS AS NEEDED, ULNA RESECTION AND STABILIZATION;  Surgeon: Charlotte Crumb, MD;  Location: [place];  Service: Orthopedics;  Laterality: Right;  . TOTAL KNEE ARTHROPLASTY Right    Dr.Harrison  . TUBAL LIGATION       OB History    Gravida  1   Para  1   Term  1   Preterm      AB      Living  0     SAB      TAB      Ectopic      Multiple      Live Births               Home Medications    Prior to Admission medications   Medication Sig Start Date End Date Taking? Authorizing Provider  amiodarone (PACERONE) 200 MG tablet Take 1 tablet (200 mg total) by mouth daily. 09/20/18  Yes Kyle, Tyrone A, DO  carvedilol (COREG) 3.125 MG tablet Take 1 tablet (3.125 mg total) by mouth 2 (two) times daily with a meal. 09/19/18  Yes Kyle, Tyrone A, DO  enoxaparin (  LOVENOX) 40 MG/0.4ML injection Inject 40 mg into the skin daily. 10/28/18  Yes [provider]  pantoprazole (PROTONIX) 40 MG tablet Take 1 tablet (40 mg total) by mouth daily. 09/20/18  Yes Kyle, Tyrone A, DO  acetaminophen (TYLENOL) 325 MG tablet Take 2 tablets (650 mg total) by mouth every 6 (six) hours as needed for mild pain (or Fever >/= 101). 09/19/18   Cherylann Ratel A, DO  acetaminophen (TYLENOL) 500 MG tablet Take 500 mg by mouth every 6 (six) hours as needed (FOR PAIN/HEADACHES).    [provider]  acetaminophen (TYLENOL) 650 MG suppository Place 1 suppository (650 mg total) rectally every 6 (six) hours as needed for mild pain (or Fever >/= 101). 09/19/18   Cherylann Ratel A, DO  albuterol (PROVENTIL) (2.5 MG/3ML) 0.083% nebulizer solution Take 3 mLs (2.5 mg total) by nebulization every 4 (four) hours as needed for wheezing or shortness of breath. 09/19/18   Marylyn Ishihara, Tyrone A, DO  Amino Acids-Protein  Hydrolys (FEEDING SUPPLEMENT, PRO-STAT SUGAR FREE 64,) LIQD Place 30 mLs into feeding tube 2 (two) times daily. 09/19/18   Cherylann Ratel A, DO  chlorhexidine (PERIDEX) 0.12 % solution 15 mLs by Mouth Rinse route 2 (two) times daily. 09/19/18   Cherylann Ratel A, DO  insulin aspart (NOVOLOG) 100 UNIT/ML injection Inject 0-20 Units into the skin every 4 (four) hours. 09/19/18   Cherylann Ratel A, DO  mirtazapine (REMERON) 15 MG tablet Take 15 mg by mouth every evening. 10/27/18   [provider]  mouth rinse LIQD solution 15 mLs by Mouth Rinse route 2 times daily at 12 noon and 4 pm. 09/19/18   Cherylann Ratel A, DO  mupirocin ointment (BACTROBAN) 2 % Place into the nose 2 (two) times daily. 09/19/18   Cherylann Ratel A, DO  Nutritional Supplements (FEEDING SUPPLEMENT, OSMOLITE 1.5 CAL,) LIQD Place 1,000 mLs into feeding tube continuous. 09/19/18   Cherylann Ratel A, DO  ondansetron (ZOFRAN) 4 MG/2ML SOLN injection Inject 2 mLs (4 mg total) into the vein every 6 (six) hours as needed for nausea. 09/19/18   Cherylann Ratel A, DO  ondansetron (ZOFRAN-ODT) 4 MG disintegrating tablet Take 1 tablet (4 mg total) by mouth every 6 (six) hours as needed for nausea. 09/19/18   Cherylann Ratel A, DO  oxyCODONE (OXY IR/ROXICODONE) 5 MG immediate release tablet Take 1-2 tablets (5-10 mg total) by mouth every 4 (four) hours as needed for moderate pain or severe pain (5mg  for moderate pain, 10mg  for severe pain). 09/19/18   Cherylann Ratel A, DO  predniSONE (DELTASONE) 20 MG tablet Take 2 tablets (40 mg total) by mouth daily with breakfast. 09/20/18   Cherylann Ratel A, DO  saccharomyces boulardii (FLORASTOR) 250 MG capsule Take 1 capsule (250 mg total) by mouth 2 (two) times daily. 09/19/18   Jonnie Finner, DO    Family History Family History  Problem Relation Age of Onset  . Diabetes Father   . Pancreatic cancer Father   . Hypertension Brother     Social History Social History   Tobacco Use  . Smoking status: Former Smoker     Packs/day: 0.25    Years: 30.00    Pack years: 7.50    Types: Cigarettes    Start date: 09/17/1956    Quit date: 04/24/2007    Years since quitting: 11.5  . Smokeless tobacco: Current User    Types: Chew  . Tobacco comment: 07/03/2013 "smoked some; don't know how much or how long or  when I quit"  Substance Use Topics  . Alcohol use: No    Alcohol/week: 0.0 standard drinks  . Drug use: No     Allergies   Macrobid [nitrofurantoin macrocrystal]   Review of Systems Review of Systems  Constitutional: Positive for fever. Negative for appetite change and fatigue.  HENT: Negative for congestion, ear discharge and sinus pressure.   Eyes: Negative for discharge.  Respiratory: Negative for cough.   Cardiovascular: Negative for chest pain.  Gastrointestinal: Negative for abdominal pain and diarrhea.  Genitourinary: Negative for frequency and hematuria.  Musculoskeletal: Negative for back pain.  Skin: Negative for rash.  Neurological: Negative for seizures and headaches.  Psychiatric/Behavioral: Negative for hallucinations.     Physical Exam Updated Vital Signs BP (!) 119/49   Pulse 84   Temp 99.1 F (37.3 C) (Oral)   Resp 17   Ht 5' 3.75" (1.619 m)   Wt 60.7 kg   SpO2 97%   BMI 23.15 kg/m   Physical Exam Vitals signs and nursing note reviewed.  Constitutional:      Appearance: She is well-developed.  HENT:     Head: Normocephalic.     Mouth/Throat:     Mouth: Mucous membranes are moist.  Eyes:     General: No scleral icterus.    Conjunctiva/sclera: Conjunctivae normal.  Neck:     Musculoskeletal: Neck supple.     Thyroid: No thyromegaly.  Cardiovascular:     Rate and Rhythm: Normal rate and regular rhythm.     Heart sounds: No murmur. No friction rub. No gallop.   Pulmonary:     Breath sounds: No stridor. No wheezing or rales.  Chest:     Chest wall: No tenderness.  Abdominal:     General: There is no distension.     Tenderness: There is no abdominal  tenderness. There is no rebound.  Musculoskeletal: Normal range of motion.  Lymphadenopathy:     Cervical: No cervical adenopathy.  Skin:    Findings: No erythema or rash.  Neurological:     Mental Status: She is oriented to person, place, and time.     Motor: No abnormal muscle tone.     Coordination: Coordination normal.  Psychiatric:        Behavior: Behavior normal.      ED Treatments / Results  Labs (all labs ordered are listed, but only abnormal results are displayed) Labs Reviewed  CBC WITH DIFFERENTIAL/PLATELET - Abnormal; Notable for the following components:      Result Value   RBC 3.26 (*)    Hemoglobin 8.7 (*)    HCT 29.6 (*)    MCHC 29.4 (*)    RDW 16.7 (*)    All other components within normal limits  COMPREHENSIVE METABOLIC PANEL - Abnormal; Notable for the following components:   Sodium 147 (*)    Potassium 3.0 (*)    Chloride 117 (*)    CO2 21 (*)    BUN 24 (*)    Creatinine, Ser 1.89 (*)    Calcium 7.6 (*)    Total Protein 5.9 (*)    Albumin 1.5 (*)    AST 72 (*)    Alkaline Phosphatase 587 (*)    Total Bilirubin 1.5 (*)    GFR calc non Af Amer 25 (*)    GFR calc Af Amer 29 (*)    All other components within normal limits  CULTURE, BLOOD (ROUTINE X 2)  SARS CORONAVIRUS 2 (HOSPITAL ORDER, PERFORMED IN [facility])  LACTIC ACID, PLASMA  URINALYSIS, ROUTINE W REFLEX MICROSCOPIC    EKG None  Radiology Dg Chest Portable 1 View  Result Date: 10/31/2018 CLINICAL DATA:  Fever. EXAM: PORTABLE CHEST 1 VIEW COMPARISON:  Sep 19, 2018 FINDINGS: A multi lead left-sided pacemaker is again noted. The lung volumes are low. There are small bilateral pleural effusions. There is generalized volume overload. IMPRESSION: 1. Cardiomegaly with vascular congestion and small bilateral pleural effusions. 2. Bibasilar airspace opacities favored to represent atelectasis. Electronically Signed   By: Constance Holster M.D.   On: 10/31/2018 22:12     Procedures Procedures (including critical care time)  Medications Ordered in ED Medications  potassium chloride SA (K-DUR) CR tablet 40 mEq (has no administration in time range)     Initial Impression / Assessment and Plan / ED Course  I have reviewed the triage vital signs and the nursing notes.  Pertinent labs & imaging results that were available during my care of the patient were reviewed by me and considered in my medical decision making (see chart for details).        Patient with hypokalemia and urinary tract infection she is sent home with potassium and Keflex.  Patient does not want admissions and she is nontoxic  Final Clinical Impressions(s) / ED Diagnoses   Final diagnoses:  None    ED Discharge Orders    None       Milton Ferguson, MD 11/01/18 1143

## 2018-10-31 NOTE — ED Triage Notes (Signed)
[facility] reports that they were called to residence by family due to fever that started today of 101, pt recently had exploratory lap. [facility] performed [facility]. Pt able to answer questions, denies any pain,

## 2018-10-31 NOTE — Discharge Instructions (Signed)
Drink plenty of fluids.  Follow up with your md next week °

## 2018-10-31 NOTE — ED Notes (Signed)
Pt denies pain, says she does have a little indigestion, but she has that often- reports it "feels better after she spit up some."  Pt says no one checked her temperature at home. Pt denies feeling bad when asked.

## 2018-10-31 NOTE — ED Notes (Signed)
[facility] here to transport pt,

## 2018-10-31 NOTE — ED Notes (Signed)
[facility] called for transport home, pt and spouse both advised that pt is nonambulatory and does not stand, , is able to sit in wheelchair but does not have wheelchair with her at present time, discharge instructions given to spouse and pt's nephew [name].

## 2018-10-31 NOTE — ED Provider Notes (Signed)
[facility] EMERGENCY DEPARTMENT Provider Note   CSN: [id number] Arrival date & time: 10/31/18  2118     History   Chief Complaint Chief Complaint  Patient presents with  . Fever    HPI [name] is a 78 y.o. female.     Patient states he does not know why she is in the emergency department.  According to the paramedics the family said she had a fever but no one has documented temperature  The history is provided by the patient. No language interpreter was used.  Fever Temp source:  Oral Severity:  Mild Onset quality:  Sudden Timing:  Constant Progression:  Improving Chronicity:  New Relieved by:  Nothing Worsened by:  Nothing Ineffective treatments:  None tried Associated symptoms: no chest pain, no congestion, no cough, no diarrhea, no headaches and no rash     Past Medical History:  Diagnosis Date  . Anemia    Hgb of 9-10  . Anemia of chronic disease    Hgb of 9-10 chronically; 06/2010: H&H-10.7/33.5, MCV-81, normal iron studies in 2010   . Arteriosclerotic cardiovascular disease (ASCVD)    Remote PTCA by patient report; LBBB; associated cardiomyopathy, presumed ischemic with EF 40-45% previously, 20% in 06/2009; h/o clinical congestive heart failure; negative stress nuclear in 2009 with inferoseptal and apical scar  . Automatic implantable cardioverter-defibrillator in situ   . Breast cancer ([place])    breast  . Chronic bronchitis ([place])   . Chronic renal disease, stage 3, moderately decreased glomerular filtration rate (GFR) between 30-59 mL/min/1.73 square meter (HCC) 02/01/2016  . Congestive heart failure (CHF) ([place])   . Elevated cholesterol   . Elevated sed rate   . GERD (gastroesophageal reflux disease)   . GI bleed   . Gout   . HOH (hard of hearing)   . Hyperlipidemia   . Hypertension   . Rheumatoid arthritis ([place])   . UTI (urinary tract infection)     Patient Active Problem List   Diagnosis Date Noted  . Goals of care, counseling/discussion    . Palliative care by specialist   . Acute respiratory failure with hypoxia ([place])   . Protein-calorie malnutrition, severe 08/25/2018  . Wide-complex tachycardia ([place])   . Acute renal failure superimposed on stage 3 chronic kidney disease ([place]) 08/18/2018  . SBO (small bowel obstruction) ([place]) 08/14/2018  . Pressure injury of skin 08/14/2018  . CAP (community acquired pneumonia) 08/13/2018  . AKI (acute kidney injury) ([place]) 08/10/2017  . Hyperkalemia 08/10/2017  . Acute blood loss anemia 09/20/2016  . Ischemic cardiomyopathy 09/20/2016  . Chronic combined systolic and diastolic CHF (congestive heart failure) ([place]) 09/20/2016  . CKD (chronic kidney disease), stage IV ([place]) 09/20/2016  . History of breast cancer 08/02/2016  . Primary osteoarthritis of both knees 08/02/2016  . Primary osteoarthritis of both feet 08/02/2016  . History of anemia 07/17/2016  . History of CHF (congestive heart failure) 06/04/2016  . History of chronic kidney disease 06/04/2016  . Symptomatic anemia 06/04/2016  . Elevated sedimentation rate 03/03/2016  . Idiopathic chronic gout of multiple sites without tophus 03/03/2016  . Total knee replacement status, right 03/03/2016  . High risk medication use 03/03/2016  . Chronic kidney disease (CKD) stage G3b/A1, moderately decreased glomerular filtration rate (GFR) between 30-44 mL/min/1.73 square meter and albuminuria creatinine ratio less than 30 mg/g (HCC) 02/01/2016  . CAD S/P remote PCI- no details 09/02/2014  . Cardiomyopathy, ischemic-EF 30-35% March 2015 09/02/2014  . Diastolic dysfunction-grade 2 09/02/2014  .  CHF exacerbation ([place]) 08/28/2014  . Acute on chronic combined systolic and diastolic congestive heart failure ([place]) 08/28/2014  . Iron deficiency anemia 08/20/2013  . Malnutrition of moderate degree ([place]) 07/21/2013  . PNA (pneumonia) 07/19/2013  . HCAP (healthcare-associated pneumonia) 07/17/2013  . Sepsis ([place]) 07/17/2013  . ARF (acute renal  failure) ([place]) 07/17/2013  . Rheumatoid arthritis ([place]) 07/04/2013  . Hypokalemia 07/03/2013  . Chest pain 07/03/2013  . Biventricular ICD in place (MDT 2014) 11/06/2012  . Anemia of chronic disease   . Breast cancer ([place])   . Hypertension   . Dyslipidemia 05/24/2009  . Chronic systolic heart failure ([place]) 05/24/2009  . Primary osteoarthritis of both hands 08/11/2007    Past Surgical History:  Procedure Laterality Date  . BI-VENTRICULAR IMPLANTABLE CARDIOVERTER DEFIBRILLATOR N/A 07/28/2012   Procedure: BI-VENTRICULAR IMPLANTABLE CARDIOVERTER DEFIBRILLATOR  (CRT-D);  Surgeon: Evans Lance, MD;  Location: [facility] CATH LAB;  Service: Cardiovascular;  Laterality: N/A;  . BI-VENTRICULAR IMPLANTABLE CARDIOVERTER DEFIBRILLATOR  (CRT-D)  07/28/2012  . BOWEL RESECTION N/A 08/21/2018   Procedure: Small Bowel Resection;  Surgeon: Jovita Kussmaul, MD;  Location: [place];  Service: General;  Laterality: N/A;  . BREAST BIOPSY Bilateral   . CATARACT EXTRACTION W/ INTRAOCULAR LENS IMPLANT Left   . CATARACT EXTRACTION W/PHACO Right 09/15/2013   Procedure: CATARACT EXTRACTION PHACO AND INTRAOCULAR LENS PLACEMENT (IOC);  Surgeon: Elta Guadeloupe T. Gershon Crane, MD;  Location: [facility] ORS;  Service: Ophthalmology;  Laterality: Right;  CDE:  10.74  . COLONOSCOPY N/A 03/04/2015   Procedure: COLONOSCOPY;  Surgeon: Rogene Houston, MD;  Location: [facility] ENDO SUITE;  Service: Endoscopy;  Laterality: N/A;  10:50   . ESOPHAGOGASTRODUODENOSCOPY N/A 03/04/2015   Procedure: ESOPHAGOGASTRODUODENOSCOPY (EGD);  Surgeon: Rogene Houston, MD;  Location: [facility] END[facility];  Service: Endoscopy;  Laterality: N/A;  . ESOPHAGOGASTRODUODENOSCOPY N/A 09/21/2016   Procedure: ESOPHAGOGASTRODUODENOSCOPY (EGD);  Surgeon: Rogene Houston, MD;  [facility];  Service: Endoscopy;  Laterality: N/A;  . GIVENS CAPSULE STUDY N/A 09/22/2016   Procedure: GIVENS CAPSULE STUDY;  Surgeon: Rogene Houston, MD;  [facility];  Service: Endoscopy;  Laterality:  N/A;  . LAPAROTOMY N/A 08/21/2018   Procedure: EXPLORATORY LAPAROTOMY WITH LYSIS OF ADHESIONS;  Surgeon: Jovita Kussmaul, MD;  Location: [place];  Service: General;  Laterality: N/A;  . MASTECTOMY Left 1998  . TENDON TRANSFER Right 08/15/2017   Procedure: TENDON TRANSFER RIGHT WRIST EXTENSORS AS NEEDED, ULNA RESECTION AND STABILIZATION;  Surgeon: Charlotte Crumb, MD;  Location: [place];  Service: Orthopedics;  Laterality: Right;  . TOTAL KNEE ARTHROPLASTY Right    Dr.Harrison  . TUBAL LIGATION       OB History    Gravida  1   Para  1   Term  1   Preterm      AB      Living  0     SAB      TAB      Ectopic      Multiple      Live Births               Home Medications    Prior to Admission medications   Medication Sig Start Date End Date Taking? Authorizing Provider  amiodarone (PACERONE) 200 MG tablet Take 1 tablet (200 mg total) by mouth daily. 09/20/18  Yes Kyle, Tyrone A, DO  carvedilol (COREG) 3.125 MG tablet Take 1 tablet (3.125 mg total) by mouth 2 (two) times daily with a meal.  09/19/18  Yes Kyle, Tyrone A, DO  enoxaparin (LOVENOX) 40 MG/0.4ML injection Inject 40 mg into the skin daily. 10/28/18  Yes [provider]  pantoprazole (PROTONIX) 40 MG tablet Take 1 tablet (40 mg total) by mouth daily. 09/20/18  Yes Kyle, Tyrone A, DO  acetaminophen (TYLENOL) 325 MG tablet Take 2 tablets (650 mg total) by mouth every 6 (six) hours as needed for mild pain (or Fever >/= 101). 09/19/18   Cherylann Ratel A, DO  acetaminophen (TYLENOL) 500 MG tablet Take 500 mg by mouth every 6 (six) hours as needed (FOR PAIN/HEADACHES).    [provider]  acetaminophen (TYLENOL) 650 MG suppository Place 1 suppository (650 mg total) rectally every 6 (six) hours as needed for mild pain (or Fever >/= 101). 09/19/18   Cherylann Ratel A, DO  albuterol (PROVENTIL) (2.5 MG/3ML) 0.083% nebulizer solution Take 3 mLs (2.5 mg total) by nebulization every 4 (four) hours as needed for wheezing or  shortness of breath. 09/19/18   Marylyn Ishihara, Tyrone A, DO  Amino Acids-Protein Hydrolys (FEEDING SUPPLEMENT, PRO-STAT SUGAR FREE 64,) LIQD Place 30 mLs into feeding tube 2 (two) times daily. 09/19/18   Cherylann Ratel A, DO  chlorhexidine (PERIDEX) 0.12 % solution 15 mLs by Mouth Rinse route 2 (two) times daily. 09/19/18   Cherylann Ratel A, DO  insulin aspart (NOVOLOG) 100 UNIT/ML injection Inject 0-20 Units into the skin every 4 (four) hours. 09/19/18   Cherylann Ratel A, DO  mirtazapine (REMERON) 15 MG tablet Take 15 mg by mouth every evening. 10/27/18   [provider]  mouth rinse LIQD solution 15 mLs by Mouth Rinse route 2 times daily at 12 noon and 4 pm. 09/19/18   Cherylann Ratel A, DO  mupirocin ointment (BACTROBAN) 2 % Place into the nose 2 (two) times daily. 09/19/18   Cherylann Ratel A, DO  Nutritional Supplements (FEEDING SUPPLEMENT, OSMOLITE 1.5 CAL,) LIQD Place 1,000 mLs into feeding tube continuous. 09/19/18   Cherylann Ratel A, DO  ondansetron (ZOFRAN) 4 MG/2ML SOLN injection Inject 2 mLs (4 mg total) into the vein every 6 (six) hours as needed for nausea. 09/19/18   Cherylann Ratel A, DO  ondansetron (ZOFRAN-ODT) 4 MG disintegrating tablet Take 1 tablet (4 mg total) by mouth every 6 (six) hours as needed for nausea. 09/19/18   Cherylann Ratel A, DO  oxyCODONE (OXY IR/ROXICODONE) 5 MG immediate release tablet Take 1-2 tablets (5-10 mg total) by mouth every 4 (four) hours as needed for moderate pain or severe pain (5mg  for moderate pain, 10mg  for severe pain). 09/19/18   Cherylann Ratel A, DO  predniSONE (DELTASONE) 20 MG tablet Take 2 tablets (40 mg total) by mouth daily with breakfast. 09/20/18   Cherylann Ratel A, DO  saccharomyces boulardii (FLORASTOR) 250 MG capsule Take 1 capsule (250 mg total) by mouth 2 (two) times daily. 09/19/18   Jonnie Finner, DO    Family History Family History  Problem Relation Age of Onset  . Diabetes Father   . Pancreatic cancer Father   . Hypertension Brother     Social History  Social History   Tobacco Use  . Smoking status: Former Smoker    Packs/day: 0.25    Years: 30.00    Pack years: 7.50    Types: Cigarettes    Start date: 09/17/1956    Quit date: 04/24/2007    Years since quitting: 11.5  . Smokeless tobacco: Current User    Types: Chew  . Tobacco comment: 07/03/2013 "smoked  some; don't know how much or how long or when I quit"  Substance Use Topics  . Alcohol use: No    Alcohol/week: 0.0 standard drinks  . Drug use: No     Allergies   Macrobid [nitrofurantoin macrocrystal]   Review of Systems Review of Systems  Constitutional: Positive for fever. Negative for appetite change and fatigue.  HENT: Negative for congestion, ear discharge and sinus pressure.   Eyes: Negative for discharge.  Respiratory: Negative for cough.   Cardiovascular: Negative for chest pain.  Gastrointestinal: Negative for abdominal pain and diarrhea.  Genitourinary: Negative for frequency and hematuria.  Musculoskeletal: Negative for back pain.  Skin: Negative for rash.  Neurological: Negative for seizures and headaches.  Psychiatric/Behavioral: Negative for hallucinations.     Physical Exam Updated Vital Signs BP (!) 119/49   Pulse 84   Temp 99.1 F (37.3 C) (Oral)   Resp 17   Ht 5' 3.75" (1.619 m)   Wt 60.7 kg   SpO2 97%   BMI 23.15 kg/m   Physical Exam Vitals signs and nursing note reviewed.  Constitutional:      Appearance: Normal appearance. She is well-developed.  HENT:     Head: Normocephalic.     Nose: Nose normal.  Eyes:     General: No scleral icterus.    Conjunctiva/sclera: Conjunctivae normal.  Neck:     Musculoskeletal: Neck supple.     Thyroid: No thyromegaly.  Cardiovascular:     Rate and Rhythm: Normal rate and regular rhythm.     Heart sounds: No murmur. No friction rub. No gallop.   Pulmonary:     Breath sounds: No stridor. No wheezing or rales.  Chest:     Chest wall: No tenderness.  Abdominal:     General: There is no  distension.     Tenderness: There is no abdominal tenderness. There is no rebound.  Musculoskeletal: Normal range of motion.  Lymphadenopathy:     Cervical: No cervical adenopathy.  Skin:    Findings: No erythema or rash.  Neurological:     Mental Status: She is alert and oriented to person, place, and time.     Motor: No abnormal muscle tone.     Coordination: Coordination normal.  Psychiatric:        Behavior: Behavior normal.      ED Treatments / Results  Labs (all labs ordered are listed, but only abnormal results are displayed) Labs Reviewed  CBC WITH DIFFERENTIAL/PLATELET - Abnormal; Notable for the following components:      Result Value   RBC 3.26 (*)    Hemoglobin 8.7 (*)    HCT 29.6 (*)    MCHC 29.4 (*)    RDW 16.7 (*)    All other components within normal limits  COMPREHENSIVE METABOLIC PANEL - Abnormal; Notable for the following components:   Sodium 147 (*)    Potassium 3.0 (*)    Chloride 117 (*)    CO2 21 (*)    BUN 24 (*)    Creatinine, Ser 1.89 (*)    Calcium 7.6 (*)    Total Protein 5.9 (*)    Albumin 1.5 (*)    AST 72 (*)    Alkaline Phosphatase 587 (*)    Total Bilirubin 1.5 (*)    GFR calc non Af Amer 25 (*)    GFR calc Af Amer 29 (*)    All other components within normal limits  CULTURE, BLOOD (ROUTINE X 2)  SARS CORONAVIRUS  2 (HOSPITAL ORDER, [place] [facility])  LACTIC ACID, PLASMA  URINALYSIS, ROUTINE W REFLEX MICROSCOPIC    EKG None  Radiology Dg Chest Portable 1 View  Result Date: 10/31/2018 CLINICAL DATA:  Fever. EXAM: PORTABLE CHEST 1 VIEW COMPARISON:  Sep 19, 2018 FINDINGS: A multi lead left-sided pacemaker is again noted. The lung volumes are low. There are small bilateral pleural effusions. There is generalized volume overload. IMPRESSION: 1. Cardiomegaly with vascular congestion and small bilateral pleural effusions. 2. Bibasilar airspace opacities favored to represent atelectasis. Electronically Signed   By:  Constance Holster M.D.   On: 10/31/2018 22:12    Procedures Procedures (including critical care time)  Medications Ordered in ED Medications  potassium chloride SA (K-DUR) CR tablet 40 mEq (has no administration in time range)     Initial Impression / Assessment and Plan / ED Course  I have reviewed the triage vital signs and the nursing notes.  Pertinent labs & imaging results that were available during my care of the patient were reviewed by me and considered in my medical decision making (see chart for details).        CBC shows anemia but this is stable.  Chemistry shows some hypokalemia and mild dehydration.  Patient given potassium and fluids.  Urinalysis pending Patient has a UTI.  She will be sent home with Keflex and potassium and follow-up with her PCP Final Clinical Impressions(s) / ED Diagnoses   Final diagnoses:  None    ED Discharge Orders    None       Milton Ferguson, MD 10/31/18 2313

## 2018-10-31 NOTE — ED Notes (Signed)
Pt does not need 2nd set of blood cultures per Dr Roderic Palau

## 2018-11-01 DIAGNOSIS — E46 Unspecified protein-calorie malnutrition: Secondary | ICD-10-CM | POA: Diagnosis not present

## 2018-11-01 DIAGNOSIS — I129 Hypertensive chronic kidney disease with stage 1 through stage 4 chronic kidney disease, or unspecified chronic kidney disease: Secondary | ICD-10-CM | POA: Diagnosis not present

## 2018-11-01 DIAGNOSIS — K913 Postprocedural intestinal obstruction, unspecified as to partial versus complete: Secondary | ICD-10-CM | POA: Diagnosis not present

## 2018-11-01 DIAGNOSIS — I509 Heart failure, unspecified: Secondary | ICD-10-CM | POA: Diagnosis not present

## 2018-11-01 DIAGNOSIS — Z853 Personal history of malignant neoplasm of breast: Secondary | ICD-10-CM | POA: Diagnosis not present

## 2018-11-01 DIAGNOSIS — J9601 Acute respiratory failure with hypoxia: Secondary | ICD-10-CM | POA: Diagnosis not present

## 2018-11-01 DIAGNOSIS — J189 Pneumonia, unspecified organism: Secondary | ICD-10-CM | POA: Diagnosis not present

## 2018-11-01 DIAGNOSIS — E871 Hypo-osmolality and hyponatremia: Secondary | ICD-10-CM | POA: Diagnosis not present

## 2018-11-01 DIAGNOSIS — Z95810 Presence of automatic (implantable) cardiac defibrillator: Secondary | ICD-10-CM | POA: Diagnosis not present

## 2018-11-01 DIAGNOSIS — D638 Anemia in other chronic diseases classified elsewhere: Secondary | ICD-10-CM | POA: Diagnosis not present

## 2018-11-01 DIAGNOSIS — J42 Unspecified chronic bronchitis: Secondary | ICD-10-CM | POA: Diagnosis not present

## 2018-11-01 DIAGNOSIS — M6281 Muscle weakness (generalized): Secondary | ICD-10-CM | POA: Diagnosis not present

## 2018-11-01 DIAGNOSIS — L89312 Pressure ulcer of right buttock, stage 2: Secondary | ICD-10-CM | POA: Diagnosis not present

## 2018-11-01 DIAGNOSIS — R1084 Generalized abdominal pain: Secondary | ICD-10-CM | POA: Diagnosis not present

## 2018-11-01 DIAGNOSIS — R945 Abnormal results of liver function studies: Secondary | ICD-10-CM | POA: Diagnosis not present

## 2018-11-01 DIAGNOSIS — Z760 Encounter for issue of repeat prescription: Secondary | ICD-10-CM | POA: Diagnosis not present

## 2018-11-01 DIAGNOSIS — Z4800 Encounter for change or removal of nonsurgical wound dressing: Secondary | ICD-10-CM | POA: Diagnosis not present

## 2018-11-01 DIAGNOSIS — K219 Gastro-esophageal reflux disease without esophagitis: Secondary | ICD-10-CM | POA: Diagnosis not present

## 2018-11-01 DIAGNOSIS — R1310 Dysphagia, unspecified: Secondary | ICD-10-CM | POA: Diagnosis not present

## 2018-11-01 DIAGNOSIS — I959 Hypotension, unspecified: Secondary | ICD-10-CM | POA: Diagnosis not present

## 2018-11-01 DIAGNOSIS — N183 Chronic kidney disease, stage 3 (moderate): Secondary | ICD-10-CM | POA: Diagnosis not present

## 2018-11-02 ENCOUNTER — Encounter: Payer: Self-pay | Admitting: Cardiology

## 2018-11-02 LAB — URINE CULTURE: Culture: 30000 — AB

## 2018-11-03 ENCOUNTER — Telehealth: Payer: Self-pay

## 2018-11-03 DIAGNOSIS — J42 Unspecified chronic bronchitis: Secondary | ICD-10-CM | POA: Diagnosis not present

## 2018-11-03 DIAGNOSIS — J189 Pneumonia, unspecified organism: Secondary | ICD-10-CM | POA: Diagnosis not present

## 2018-11-03 DIAGNOSIS — I129 Hypertensive chronic kidney disease with stage 1 through stage 4 chronic kidney disease, or unspecified chronic kidney disease: Secondary | ICD-10-CM | POA: Diagnosis not present

## 2018-11-03 DIAGNOSIS — N183 Chronic kidney disease, stage 3 (moderate): Secondary | ICD-10-CM | POA: Diagnosis not present

## 2018-11-03 DIAGNOSIS — R1310 Dysphagia, unspecified: Secondary | ICD-10-CM | POA: Diagnosis not present

## 2018-11-03 DIAGNOSIS — Z4800 Encounter for change or removal of nonsurgical wound dressing: Secondary | ICD-10-CM | POA: Diagnosis not present

## 2018-11-03 DIAGNOSIS — M6281 Muscle weakness (generalized): Secondary | ICD-10-CM | POA: Diagnosis not present

## 2018-11-03 DIAGNOSIS — R945 Abnormal results of liver function studies: Secondary | ICD-10-CM | POA: Diagnosis not present

## 2018-11-03 DIAGNOSIS — D638 Anemia in other chronic diseases classified elsewhere: Secondary | ICD-10-CM | POA: Diagnosis not present

## 2018-11-03 DIAGNOSIS — Z853 Personal history of malignant neoplasm of breast: Secondary | ICD-10-CM | POA: Diagnosis not present

## 2018-11-03 DIAGNOSIS — K913 Postprocedural intestinal obstruction, unspecified as to partial versus complete: Secondary | ICD-10-CM | POA: Diagnosis not present

## 2018-11-03 DIAGNOSIS — E46 Unspecified protein-calorie malnutrition: Secondary | ICD-10-CM | POA: Diagnosis not present

## 2018-11-03 DIAGNOSIS — I509 Heart failure, unspecified: Secondary | ICD-10-CM | POA: Diagnosis not present

## 2018-11-03 DIAGNOSIS — Z760 Encounter for issue of repeat prescription: Secondary | ICD-10-CM | POA: Diagnosis not present

## 2018-11-03 DIAGNOSIS — Z95810 Presence of automatic (implantable) cardiac defibrillator: Secondary | ICD-10-CM | POA: Diagnosis not present

## 2018-11-03 DIAGNOSIS — E871 Hypo-osmolality and hyponatremia: Secondary | ICD-10-CM | POA: Diagnosis not present

## 2018-11-03 DIAGNOSIS — K219 Gastro-esophageal reflux disease without esophagitis: Secondary | ICD-10-CM | POA: Diagnosis not present

## 2018-11-03 DIAGNOSIS — L89312 Pressure ulcer of right buttock, stage 2: Secondary | ICD-10-CM | POA: Diagnosis not present

## 2018-11-03 DIAGNOSIS — J9601 Acute respiratory failure with hypoxia: Secondary | ICD-10-CM | POA: Diagnosis not present

## 2018-11-03 NOTE — Telephone Encounter (Signed)
Post ED Visit - Positive Culture Follow-up: Unsuccessful Patient Follow-up  Culture assessed and recommendations reviewed by:  []  Elenor Quinones, Pharm.D. []  Heide Guile, Pharm.D., BCPS AQ-ID []  Parks Neptune, Pharm.D., BCPS []  Alycia Rossetti, Pharm.D., BCPS []  McFarlan, Pharm.D., BCPS, AAHIVP []  Legrand Como, Pharm.D., BCPS, AAHIVP []  Wynell Balloon, PharmD []  Vincenza Hews, PharmD, BCPS Pharm D Positive urine culture Symptom check for UC r/t fever []  Patient discharged without antimicrobial prescription and treatment is now indicated []  Organism is resistant to prescribed ED discharge antimicrobial []  Patient with positive blood cultures   Unable to contact patient after 3 attempts, letter will be sent to address on file  Genia Del 11/03/2018, 11:57 AM

## 2018-11-04 DIAGNOSIS — K219 Gastro-esophageal reflux disease without esophagitis: Secondary | ICD-10-CM | POA: Diagnosis not present

## 2018-11-04 DIAGNOSIS — I509 Heart failure, unspecified: Secondary | ICD-10-CM | POA: Diagnosis not present

## 2018-11-04 DIAGNOSIS — E46 Unspecified protein-calorie malnutrition: Secondary | ICD-10-CM | POA: Diagnosis not present

## 2018-11-04 DIAGNOSIS — Z760 Encounter for issue of repeat prescription: Secondary | ICD-10-CM | POA: Diagnosis not present

## 2018-11-04 DIAGNOSIS — R945 Abnormal results of liver function studies: Secondary | ICD-10-CM | POA: Diagnosis not present

## 2018-11-04 DIAGNOSIS — S61402D Unspecified open wound of left hand, subsequent encounter: Secondary | ICD-10-CM | POA: Diagnosis not present

## 2018-11-04 DIAGNOSIS — Z4800 Encounter for change or removal of nonsurgical wound dressing: Secondary | ICD-10-CM | POA: Diagnosis not present

## 2018-11-04 DIAGNOSIS — R1310 Dysphagia, unspecified: Secondary | ICD-10-CM | POA: Diagnosis not present

## 2018-11-04 DIAGNOSIS — M6281 Muscle weakness (generalized): Secondary | ICD-10-CM | POA: Diagnosis not present

## 2018-11-04 DIAGNOSIS — Z853 Personal history of malignant neoplasm of breast: Secondary | ICD-10-CM | POA: Diagnosis not present

## 2018-11-04 DIAGNOSIS — J9601 Acute respiratory failure with hypoxia: Secondary | ICD-10-CM | POA: Diagnosis not present

## 2018-11-04 DIAGNOSIS — I129 Hypertensive chronic kidney disease with stage 1 through stage 4 chronic kidney disease, or unspecified chronic kidney disease: Secondary | ICD-10-CM | POA: Diagnosis not present

## 2018-11-04 DIAGNOSIS — L89312 Pressure ulcer of right buttock, stage 2: Secondary | ICD-10-CM | POA: Diagnosis not present

## 2018-11-04 DIAGNOSIS — M069 Rheumatoid arthritis, unspecified: Secondary | ICD-10-CM | POA: Diagnosis not present

## 2018-11-04 DIAGNOSIS — K913 Postprocedural intestinal obstruction, unspecified as to partial versus complete: Secondary | ICD-10-CM | POA: Diagnosis not present

## 2018-11-04 DIAGNOSIS — E871 Hypo-osmolality and hyponatremia: Secondary | ICD-10-CM | POA: Diagnosis not present

## 2018-11-04 DIAGNOSIS — D638 Anemia in other chronic diseases classified elsewhere: Secondary | ICD-10-CM | POA: Diagnosis not present

## 2018-11-04 DIAGNOSIS — Z95810 Presence of automatic (implantable) cardiac defibrillator: Secondary | ICD-10-CM | POA: Diagnosis not present

## 2018-11-04 DIAGNOSIS — J189 Pneumonia, unspecified organism: Secondary | ICD-10-CM | POA: Diagnosis not present

## 2018-11-04 DIAGNOSIS — J42 Unspecified chronic bronchitis: Secondary | ICD-10-CM | POA: Diagnosis not present

## 2018-11-04 DIAGNOSIS — N183 Chronic kidney disease, stage 3 (moderate): Secondary | ICD-10-CM | POA: Diagnosis not present

## 2018-11-04 DIAGNOSIS — S31100D Unspecified open wound of abdominal wall, right upper quadrant without penetration into peritoneal cavity, subsequent encounter: Secondary | ICD-10-CM | POA: Diagnosis not present

## 2018-11-04 DIAGNOSIS — I5022 Chronic systolic (congestive) heart failure: Secondary | ICD-10-CM | POA: Diagnosis not present

## 2018-11-05 DIAGNOSIS — J9601 Acute respiratory failure with hypoxia: Secondary | ICD-10-CM | POA: Diagnosis not present

## 2018-11-05 DIAGNOSIS — Z760 Encounter for issue of repeat prescription: Secondary | ICD-10-CM | POA: Diagnosis not present

## 2018-11-05 DIAGNOSIS — J189 Pneumonia, unspecified organism: Secondary | ICD-10-CM | POA: Diagnosis not present

## 2018-11-05 DIAGNOSIS — Z853 Personal history of malignant neoplasm of breast: Secondary | ICD-10-CM | POA: Diagnosis not present

## 2018-11-05 DIAGNOSIS — I509 Heart failure, unspecified: Secondary | ICD-10-CM | POA: Diagnosis not present

## 2018-11-05 DIAGNOSIS — J42 Unspecified chronic bronchitis: Secondary | ICD-10-CM | POA: Diagnosis not present

## 2018-11-05 DIAGNOSIS — Z95810 Presence of automatic (implantable) cardiac defibrillator: Secondary | ICD-10-CM | POA: Diagnosis not present

## 2018-11-05 DIAGNOSIS — K913 Postprocedural intestinal obstruction, unspecified as to partial versus complete: Secondary | ICD-10-CM | POA: Diagnosis not present

## 2018-11-05 DIAGNOSIS — I129 Hypertensive chronic kidney disease with stage 1 through stage 4 chronic kidney disease, or unspecified chronic kidney disease: Secondary | ICD-10-CM | POA: Diagnosis not present

## 2018-11-05 DIAGNOSIS — K219 Gastro-esophageal reflux disease without esophagitis: Secondary | ICD-10-CM | POA: Diagnosis not present

## 2018-11-05 DIAGNOSIS — N183 Chronic kidney disease, stage 3 (moderate): Secondary | ICD-10-CM | POA: Diagnosis not present

## 2018-11-05 DIAGNOSIS — D638 Anemia in other chronic diseases classified elsewhere: Secondary | ICD-10-CM | POA: Diagnosis not present

## 2018-11-05 DIAGNOSIS — Z4800 Encounter for change or removal of nonsurgical wound dressing: Secondary | ICD-10-CM | POA: Diagnosis not present

## 2018-11-05 DIAGNOSIS — R945 Abnormal results of liver function studies: Secondary | ICD-10-CM | POA: Diagnosis not present

## 2018-11-05 DIAGNOSIS — L89312 Pressure ulcer of right buttock, stage 2: Secondary | ICD-10-CM | POA: Diagnosis not present

## 2018-11-05 DIAGNOSIS — R1310 Dysphagia, unspecified: Secondary | ICD-10-CM | POA: Diagnosis not present

## 2018-11-05 DIAGNOSIS — M6281 Muscle weakness (generalized): Secondary | ICD-10-CM | POA: Diagnosis not present

## 2018-11-05 DIAGNOSIS — E46 Unspecified protein-calorie malnutrition: Secondary | ICD-10-CM | POA: Diagnosis not present

## 2018-11-05 DIAGNOSIS — E871 Hypo-osmolality and hyponatremia: Secondary | ICD-10-CM | POA: Diagnosis not present

## 2018-11-05 LAB — CULTURE, BLOOD (ROUTINE X 2)
Culture: NO GROWTH
Special Requests: ADEQUATE

## 2018-11-06 DIAGNOSIS — J9601 Acute respiratory failure with hypoxia: Secondary | ICD-10-CM | POA: Diagnosis not present

## 2018-11-06 DIAGNOSIS — I509 Heart failure, unspecified: Secondary | ICD-10-CM | POA: Diagnosis not present

## 2018-11-06 DIAGNOSIS — K219 Gastro-esophageal reflux disease without esophagitis: Secondary | ICD-10-CM | POA: Diagnosis not present

## 2018-11-06 DIAGNOSIS — J42 Unspecified chronic bronchitis: Secondary | ICD-10-CM | POA: Diagnosis not present

## 2018-11-06 DIAGNOSIS — K913 Postprocedural intestinal obstruction, unspecified as to partial versus complete: Secondary | ICD-10-CM | POA: Diagnosis not present

## 2018-11-06 DIAGNOSIS — L89312 Pressure ulcer of right buttock, stage 2: Secondary | ICD-10-CM | POA: Diagnosis not present

## 2018-11-06 DIAGNOSIS — Z4800 Encounter for change or removal of nonsurgical wound dressing: Secondary | ICD-10-CM | POA: Diagnosis not present

## 2018-11-06 DIAGNOSIS — Z853 Personal history of malignant neoplasm of breast: Secondary | ICD-10-CM | POA: Diagnosis not present

## 2018-11-06 DIAGNOSIS — M6281 Muscle weakness (generalized): Secondary | ICD-10-CM | POA: Diagnosis not present

## 2018-11-06 DIAGNOSIS — D638 Anemia in other chronic diseases classified elsewhere: Secondary | ICD-10-CM | POA: Diagnosis not present

## 2018-11-06 DIAGNOSIS — Z760 Encounter for issue of repeat prescription: Secondary | ICD-10-CM | POA: Diagnosis not present

## 2018-11-06 DIAGNOSIS — I129 Hypertensive chronic kidney disease with stage 1 through stage 4 chronic kidney disease, or unspecified chronic kidney disease: Secondary | ICD-10-CM | POA: Diagnosis not present

## 2018-11-06 DIAGNOSIS — N183 Chronic kidney disease, stage 3 (moderate): Secondary | ICD-10-CM | POA: Diagnosis not present

## 2018-11-06 DIAGNOSIS — Z95810 Presence of automatic (implantable) cardiac defibrillator: Secondary | ICD-10-CM | POA: Diagnosis not present

## 2018-11-06 DIAGNOSIS — R1310 Dysphagia, unspecified: Secondary | ICD-10-CM | POA: Diagnosis not present

## 2018-11-06 DIAGNOSIS — R945 Abnormal results of liver function studies: Secondary | ICD-10-CM | POA: Diagnosis not present

## 2018-11-06 DIAGNOSIS — E46 Unspecified protein-calorie malnutrition: Secondary | ICD-10-CM | POA: Diagnosis not present

## 2018-11-06 DIAGNOSIS — E871 Hypo-osmolality and hyponatremia: Secondary | ICD-10-CM | POA: Diagnosis not present

## 2018-11-06 DIAGNOSIS — J189 Pneumonia, unspecified organism: Secondary | ICD-10-CM | POA: Diagnosis not present

## 2018-11-07 DIAGNOSIS — E46 Unspecified protein-calorie malnutrition: Secondary | ICD-10-CM | POA: Diagnosis not present

## 2018-11-07 DIAGNOSIS — Z853 Personal history of malignant neoplasm of breast: Secondary | ICD-10-CM | POA: Diagnosis not present

## 2018-11-07 DIAGNOSIS — N183 Chronic kidney disease, stage 3 (moderate): Secondary | ICD-10-CM | POA: Diagnosis not present

## 2018-11-07 DIAGNOSIS — J42 Unspecified chronic bronchitis: Secondary | ICD-10-CM | POA: Diagnosis not present

## 2018-11-07 DIAGNOSIS — Z4800 Encounter for change or removal of nonsurgical wound dressing: Secondary | ICD-10-CM | POA: Diagnosis not present

## 2018-11-07 DIAGNOSIS — J189 Pneumonia, unspecified organism: Secondary | ICD-10-CM | POA: Diagnosis not present

## 2018-11-07 DIAGNOSIS — L89312 Pressure ulcer of right buttock, stage 2: Secondary | ICD-10-CM | POA: Diagnosis not present

## 2018-11-07 DIAGNOSIS — D638 Anemia in other chronic diseases classified elsewhere: Secondary | ICD-10-CM | POA: Diagnosis not present

## 2018-11-07 DIAGNOSIS — M6281 Muscle weakness (generalized): Secondary | ICD-10-CM | POA: Diagnosis not present

## 2018-11-07 DIAGNOSIS — K219 Gastro-esophageal reflux disease without esophagitis: Secondary | ICD-10-CM | POA: Diagnosis not present

## 2018-11-07 DIAGNOSIS — E871 Hypo-osmolality and hyponatremia: Secondary | ICD-10-CM | POA: Diagnosis not present

## 2018-11-07 DIAGNOSIS — J9601 Acute respiratory failure with hypoxia: Secondary | ICD-10-CM | POA: Diagnosis not present

## 2018-11-07 DIAGNOSIS — I129 Hypertensive chronic kidney disease with stage 1 through stage 4 chronic kidney disease, or unspecified chronic kidney disease: Secondary | ICD-10-CM | POA: Diagnosis not present

## 2018-11-07 DIAGNOSIS — R945 Abnormal results of liver function studies: Secondary | ICD-10-CM | POA: Diagnosis not present

## 2018-11-07 DIAGNOSIS — Z95810 Presence of automatic (implantable) cardiac defibrillator: Secondary | ICD-10-CM | POA: Diagnosis not present

## 2018-11-07 DIAGNOSIS — I509 Heart failure, unspecified: Secondary | ICD-10-CM | POA: Diagnosis not present

## 2018-11-07 DIAGNOSIS — K913 Postprocedural intestinal obstruction, unspecified as to partial versus complete: Secondary | ICD-10-CM | POA: Diagnosis not present

## 2018-11-07 DIAGNOSIS — Z760 Encounter for issue of repeat prescription: Secondary | ICD-10-CM | POA: Diagnosis not present

## 2018-11-07 DIAGNOSIS — R1310 Dysphagia, unspecified: Secondary | ICD-10-CM | POA: Diagnosis not present

## 2018-11-08 DIAGNOSIS — N183 Chronic kidney disease, stage 3 (moderate): Secondary | ICD-10-CM | POA: Diagnosis not present

## 2018-11-08 DIAGNOSIS — J42 Unspecified chronic bronchitis: Secondary | ICD-10-CM | POA: Diagnosis not present

## 2018-11-08 DIAGNOSIS — J189 Pneumonia, unspecified organism: Secondary | ICD-10-CM | POA: Diagnosis not present

## 2018-11-08 DIAGNOSIS — Z853 Personal history of malignant neoplasm of breast: Secondary | ICD-10-CM | POA: Diagnosis not present

## 2018-11-08 DIAGNOSIS — L89312 Pressure ulcer of right buttock, stage 2: Secondary | ICD-10-CM | POA: Diagnosis not present

## 2018-11-08 DIAGNOSIS — Z4800 Encounter for change or removal of nonsurgical wound dressing: Secondary | ICD-10-CM | POA: Diagnosis not present

## 2018-11-08 DIAGNOSIS — K219 Gastro-esophageal reflux disease without esophagitis: Secondary | ICD-10-CM | POA: Diagnosis not present

## 2018-11-08 DIAGNOSIS — D638 Anemia in other chronic diseases classified elsewhere: Secondary | ICD-10-CM | POA: Diagnosis not present

## 2018-11-08 DIAGNOSIS — I509 Heart failure, unspecified: Secondary | ICD-10-CM | POA: Diagnosis not present

## 2018-11-08 DIAGNOSIS — E46 Unspecified protein-calorie malnutrition: Secondary | ICD-10-CM | POA: Diagnosis not present

## 2018-11-08 DIAGNOSIS — R1310 Dysphagia, unspecified: Secondary | ICD-10-CM | POA: Diagnosis not present

## 2018-11-08 DIAGNOSIS — M6281 Muscle weakness (generalized): Secondary | ICD-10-CM | POA: Diagnosis not present

## 2018-11-08 DIAGNOSIS — K913 Postprocedural intestinal obstruction, unspecified as to partial versus complete: Secondary | ICD-10-CM | POA: Diagnosis not present

## 2018-11-08 DIAGNOSIS — Z760 Encounter for issue of repeat prescription: Secondary | ICD-10-CM | POA: Diagnosis not present

## 2018-11-08 DIAGNOSIS — R945 Abnormal results of liver function studies: Secondary | ICD-10-CM | POA: Diagnosis not present

## 2018-11-08 DIAGNOSIS — I129 Hypertensive chronic kidney disease with stage 1 through stage 4 chronic kidney disease, or unspecified chronic kidney disease: Secondary | ICD-10-CM | POA: Diagnosis not present

## 2018-11-08 DIAGNOSIS — E871 Hypo-osmolality and hyponatremia: Secondary | ICD-10-CM | POA: Diagnosis not present

## 2018-11-08 DIAGNOSIS — Z95810 Presence of automatic (implantable) cardiac defibrillator: Secondary | ICD-10-CM | POA: Diagnosis not present

## 2018-11-08 DIAGNOSIS — J9601 Acute respiratory failure with hypoxia: Secondary | ICD-10-CM | POA: Diagnosis not present

## 2018-11-10 ENCOUNTER — Ambulatory Visit (INDEPENDENT_AMBULATORY_CARE_PROVIDER_SITE_OTHER): Payer: Medicare Other

## 2018-11-10 DIAGNOSIS — Z4800 Encounter for change or removal of nonsurgical wound dressing: Secondary | ICD-10-CM | POA: Diagnosis not present

## 2018-11-10 DIAGNOSIS — J189 Pneumonia, unspecified organism: Secondary | ICD-10-CM | POA: Diagnosis not present

## 2018-11-10 DIAGNOSIS — J42 Unspecified chronic bronchitis: Secondary | ICD-10-CM | POA: Diagnosis not present

## 2018-11-10 DIAGNOSIS — I5022 Chronic systolic (congestive) heart failure: Secondary | ICD-10-CM

## 2018-11-10 DIAGNOSIS — K219 Gastro-esophageal reflux disease without esophagitis: Secondary | ICD-10-CM | POA: Diagnosis not present

## 2018-11-10 DIAGNOSIS — D638 Anemia in other chronic diseases classified elsewhere: Secondary | ICD-10-CM | POA: Diagnosis not present

## 2018-11-10 DIAGNOSIS — M6281 Muscle weakness (generalized): Secondary | ICD-10-CM | POA: Diagnosis not present

## 2018-11-10 DIAGNOSIS — N183 Chronic kidney disease, stage 3 (moderate): Secondary | ICD-10-CM | POA: Diagnosis not present

## 2018-11-10 DIAGNOSIS — E871 Hypo-osmolality and hyponatremia: Secondary | ICD-10-CM | POA: Diagnosis not present

## 2018-11-10 DIAGNOSIS — I509 Heart failure, unspecified: Secondary | ICD-10-CM | POA: Diagnosis not present

## 2018-11-10 DIAGNOSIS — Z95810 Presence of automatic (implantable) cardiac defibrillator: Secondary | ICD-10-CM | POA: Diagnosis not present

## 2018-11-10 DIAGNOSIS — Z853 Personal history of malignant neoplasm of breast: Secondary | ICD-10-CM | POA: Diagnosis not present

## 2018-11-10 DIAGNOSIS — L89312 Pressure ulcer of right buttock, stage 2: Secondary | ICD-10-CM | POA: Diagnosis not present

## 2018-11-10 DIAGNOSIS — Z760 Encounter for issue of repeat prescription: Secondary | ICD-10-CM | POA: Diagnosis not present

## 2018-11-10 DIAGNOSIS — R945 Abnormal results of liver function studies: Secondary | ICD-10-CM | POA: Diagnosis not present

## 2018-11-10 DIAGNOSIS — K913 Postprocedural intestinal obstruction, unspecified as to partial versus complete: Secondary | ICD-10-CM | POA: Diagnosis not present

## 2018-11-10 DIAGNOSIS — I129 Hypertensive chronic kidney disease with stage 1 through stage 4 chronic kidney disease, or unspecified chronic kidney disease: Secondary | ICD-10-CM | POA: Diagnosis not present

## 2018-11-10 DIAGNOSIS — R1310 Dysphagia, unspecified: Secondary | ICD-10-CM | POA: Diagnosis not present

## 2018-11-10 DIAGNOSIS — E46 Unspecified protein-calorie malnutrition: Secondary | ICD-10-CM | POA: Diagnosis not present

## 2018-11-10 DIAGNOSIS — J9601 Acute respiratory failure with hypoxia: Secondary | ICD-10-CM | POA: Diagnosis not present

## 2018-11-11 ENCOUNTER — Telehealth: Payer: Self-pay

## 2018-11-11 DIAGNOSIS — I509 Heart failure, unspecified: Secondary | ICD-10-CM | POA: Diagnosis not present

## 2018-11-11 DIAGNOSIS — D638 Anemia in other chronic diseases classified elsewhere: Secondary | ICD-10-CM | POA: Diagnosis not present

## 2018-11-11 DIAGNOSIS — E46 Unspecified protein-calorie malnutrition: Secondary | ICD-10-CM | POA: Diagnosis not present

## 2018-11-11 DIAGNOSIS — N183 Chronic kidney disease, stage 3 (moderate): Secondary | ICD-10-CM | POA: Diagnosis not present

## 2018-11-11 DIAGNOSIS — Z4800 Encounter for change or removal of nonsurgical wound dressing: Secondary | ICD-10-CM | POA: Diagnosis not present

## 2018-11-11 DIAGNOSIS — J42 Unspecified chronic bronchitis: Secondary | ICD-10-CM | POA: Diagnosis not present

## 2018-11-11 DIAGNOSIS — J189 Pneumonia, unspecified organism: Secondary | ICD-10-CM | POA: Diagnosis not present

## 2018-11-11 DIAGNOSIS — R945 Abnormal results of liver function studies: Secondary | ICD-10-CM | POA: Diagnosis not present

## 2018-11-11 DIAGNOSIS — L89312 Pressure ulcer of right buttock, stage 2: Secondary | ICD-10-CM | POA: Diagnosis not present

## 2018-11-11 DIAGNOSIS — Z853 Personal history of malignant neoplasm of breast: Secondary | ICD-10-CM | POA: Diagnosis not present

## 2018-11-11 DIAGNOSIS — Z760 Encounter for issue of repeat prescription: Secondary | ICD-10-CM | POA: Diagnosis not present

## 2018-11-11 DIAGNOSIS — I129 Hypertensive chronic kidney disease with stage 1 through stage 4 chronic kidney disease, or unspecified chronic kidney disease: Secondary | ICD-10-CM | POA: Diagnosis not present

## 2018-11-11 DIAGNOSIS — Z95810 Presence of automatic (implantable) cardiac defibrillator: Secondary | ICD-10-CM | POA: Diagnosis not present

## 2018-11-11 DIAGNOSIS — M6281 Muscle weakness (generalized): Secondary | ICD-10-CM | POA: Diagnosis not present

## 2018-11-11 DIAGNOSIS — J9601 Acute respiratory failure with hypoxia: Secondary | ICD-10-CM | POA: Diagnosis not present

## 2018-11-11 DIAGNOSIS — K913 Postprocedural intestinal obstruction, unspecified as to partial versus complete: Secondary | ICD-10-CM | POA: Diagnosis not present

## 2018-11-11 DIAGNOSIS — K219 Gastro-esophageal reflux disease without esophagitis: Secondary | ICD-10-CM | POA: Diagnosis not present

## 2018-11-11 DIAGNOSIS — R1310 Dysphagia, unspecified: Secondary | ICD-10-CM | POA: Diagnosis not present

## 2018-11-11 DIAGNOSIS — E871 Hypo-osmolality and hyponatremia: Secondary | ICD-10-CM | POA: Diagnosis not present

## 2018-11-11 NOTE — Progress Notes (Signed)
Spoke with nephew [name].  He said patient is still trying get her strength back and remains very weak after 3 hospitalizations.  Patient was in [place] rehab from end of May until 7/4 and then back to ER on 7/10 for a few hours.  She is not able to get out of bed due to she is so weak.  Her abdominal surgical wound is still healing.  He said all her medications from home went with her in the ambulance in May and they were just notified the last couple of days that the home medications were at [facility].  He said he is unsure what medications she is on now but will call back after he visits her tonight.  He said she may have started taking the Lasix again in the last 2 days which could correlate with impedance slightly improving.  Will know more once nephew gets the current med list.   Post hospital office visits have not been scheduled due to patient is too weak to go to the office and would have to go by ambulance. Patient is very hard of hearing and a family member would need to be with her for any telehealth visits.  Nephew will provide update by tomorrow.

## 2018-11-11 NOTE — Progress Notes (Signed)
EPIC Encounter for ICM Monitoring  Patient Name: [name] is a 78 y.o. female Date: 11/11/2018 Primary Care Physican: [name], MD Primary [place] Electrophysiologist: Santina Evans Pacing:93.4% Weight:unknown  Attempted call Marena Chancy, nephew and left message to return call. 3 hospitalizations since 07/2018.  Optivol thoracic impedance suggesting possible ongoing fluid accumulation since 10/17/2018.  Prescribed:Lasix was stopped per 09/19/2018 hospital discharge notes.  A diuretic is not listed in current med list.  Labs: 10/31/2018 Creatinine 1.89, BUN 24, Potassium 3.0, Sodium 147, GFR 25-29 10/24/2018 Creatinine 1.25, BUN 14, Potassium 3.8, Sodium 141, GFR 41-47  10/20/2018 Creatinine 1.21, BUN 13, Potassium 3.3, Sodium 143, GFR 42-49  10/19/2018 Creatinine 1.28, BUN 12, Potassium 4.1, Sodium 140, GFR 39-46  10/17/2018 Creatinine 1.38, BUN 18, Potassium 4.0, Sodium 137, GFR 36-42  10/16/2018 Creatinine 1.54, BUN 21, Potassium 4.1, Sodium 137, GFR 32-37 10/14/2018 Creatinine 1.35, BUN 15, Potassium 4.1, Sodium 139, GFR 37-43  A complete set of results can be found in Results Review.   Recommendations:Unable to reach.    Follow-up plan: ICM clinic phone appointment on7/28/2020 to recheck fluid levels.   Copy of ICM check sent to Dr.Taylor and Dr [name].  3 month ICM trend: 11/10/2018    1 Year ICM trend:       [name], RN 11/11/2018 8:41 AM

## 2018-11-11 NOTE — Telephone Encounter (Signed)
Remote ICM transmission received.  Attempted call to nephew [name] regarding ICM remote transmission and left message to return call.

## 2018-11-12 ENCOUNTER — Encounter (HOSPITAL_COMMUNITY): Payer: Self-pay | Admitting: *Deleted

## 2018-11-12 ENCOUNTER — Other Ambulatory Visit: Payer: Self-pay

## 2018-11-12 ENCOUNTER — Emergency Department (HOSPITAL_COMMUNITY)
Admission: EM | Admit: 2018-11-12 | Discharge: 2018-11-12 | Disposition: A | Discharge status: Home / Self Care | Payer: Medicare Other | Attending: Emergency Medicine | Admitting: Emergency Medicine

## 2018-11-12 ENCOUNTER — Emergency Department (HOSPITAL_COMMUNITY): Payer: Medicare Other

## 2018-11-12 ENCOUNTER — Telehealth: Payer: Self-pay

## 2018-11-12 DIAGNOSIS — I130 Hypertensive heart and chronic kidney disease with heart failure and stage 1 through stage 4 chronic kidney disease, or unspecified chronic kidney disease: Secondary | ICD-10-CM | POA: Diagnosis not present

## 2018-11-12 DIAGNOSIS — R5381 Other malaise: Secondary | ICD-10-CM | POA: Diagnosis not present

## 2018-11-12 DIAGNOSIS — I129 Hypertensive chronic kidney disease with stage 1 through stage 4 chronic kidney disease, or unspecified chronic kidney disease: Secondary | ICD-10-CM | POA: Diagnosis not present

## 2018-11-12 DIAGNOSIS — Z20828 Contact with and (suspected) exposure to other viral communicable diseases: Secondary | ICD-10-CM | POA: Diagnosis not present

## 2018-11-12 DIAGNOSIS — Z95810 Presence of automatic (implantable) cardiac defibrillator: Secondary | ICD-10-CM | POA: Diagnosis not present

## 2018-11-12 DIAGNOSIS — E46 Unspecified protein-calorie malnutrition: Secondary | ICD-10-CM | POA: Diagnosis not present

## 2018-11-12 DIAGNOSIS — M6281 Muscle weakness (generalized): Secondary | ICD-10-CM | POA: Diagnosis not present

## 2018-11-12 DIAGNOSIS — I10 Essential (primary) hypertension: Secondary | ICD-10-CM | POA: Diagnosis not present

## 2018-11-12 DIAGNOSIS — L89312 Pressure ulcer of right buttock, stage 2: Secondary | ICD-10-CM | POA: Diagnosis not present

## 2018-11-12 DIAGNOSIS — K913 Postprocedural intestinal obstruction, unspecified as to partial versus complete: Secondary | ICD-10-CM | POA: Diagnosis not present

## 2018-11-12 DIAGNOSIS — F17220 Nicotine dependence, chewing tobacco, uncomplicated: Secondary | ICD-10-CM | POA: Diagnosis not present

## 2018-11-12 DIAGNOSIS — Z96651 Presence of right artificial knee joint: Secondary | ICD-10-CM | POA: Diagnosis not present

## 2018-11-12 DIAGNOSIS — N183 Chronic kidney disease, stage 3 (moderate): Secondary | ICD-10-CM | POA: Diagnosis not present

## 2018-11-12 DIAGNOSIS — Z853 Personal history of malignant neoplasm of breast: Secondary | ICD-10-CM | POA: Diagnosis not present

## 2018-11-12 DIAGNOSIS — R7989 Other specified abnormal findings of blood chemistry: Secondary | ICD-10-CM | POA: Diagnosis not present

## 2018-11-12 DIAGNOSIS — I5042 Chronic combined systolic (congestive) and diastolic (congestive) heart failure: Secondary | ICD-10-CM | POA: Insufficient documentation

## 2018-11-12 DIAGNOSIS — I2510 Atherosclerotic heart disease of native coronary artery without angina pectoris: Secondary | ICD-10-CM | POA: Diagnosis not present

## 2018-11-12 DIAGNOSIS — R509 Fever, unspecified: Secondary | ICD-10-CM | POA: Diagnosis not present

## 2018-11-12 DIAGNOSIS — R945 Abnormal results of liver function studies: Secondary | ICD-10-CM | POA: Diagnosis not present

## 2018-11-12 DIAGNOSIS — Z7901 Long term (current) use of anticoagulants: Secondary | ICD-10-CM | POA: Insufficient documentation

## 2018-11-12 DIAGNOSIS — E870 Hyperosmolality and hypernatremia: Secondary | ICD-10-CM | POA: Diagnosis not present

## 2018-11-12 DIAGNOSIS — Z79899 Other long term (current) drug therapy: Secondary | ICD-10-CM | POA: Diagnosis not present

## 2018-11-12 DIAGNOSIS — E871 Hypo-osmolality and hyponatremia: Secondary | ICD-10-CM | POA: Diagnosis not present

## 2018-11-12 DIAGNOSIS — Z4800 Encounter for change or removal of nonsurgical wound dressing: Secondary | ICD-10-CM | POA: Diagnosis not present

## 2018-11-12 DIAGNOSIS — Z760 Encounter for issue of repeat prescription: Secondary | ICD-10-CM | POA: Diagnosis not present

## 2018-11-12 DIAGNOSIS — D638 Anemia in other chronic diseases classified elsewhere: Secondary | ICD-10-CM | POA: Diagnosis not present

## 2018-11-12 DIAGNOSIS — R531 Weakness: Secondary | ICD-10-CM | POA: Diagnosis not present

## 2018-11-12 DIAGNOSIS — Z7401 Bed confinement status: Secondary | ICD-10-CM | POA: Diagnosis not present

## 2018-11-12 DIAGNOSIS — J42 Unspecified chronic bronchitis: Secondary | ICD-10-CM | POA: Diagnosis not present

## 2018-11-12 DIAGNOSIS — K219 Gastro-esophageal reflux disease without esophagitis: Secondary | ICD-10-CM | POA: Diagnosis not present

## 2018-11-12 DIAGNOSIS — I509 Heart failure, unspecified: Secondary | ICD-10-CM | POA: Diagnosis not present

## 2018-11-12 DIAGNOSIS — Z794 Long term (current) use of insulin: Secondary | ICD-10-CM | POA: Insufficient documentation

## 2018-11-12 DIAGNOSIS — N390 Urinary tract infection, site not specified: Secondary | ICD-10-CM | POA: Insufficient documentation

## 2018-11-12 DIAGNOSIS — J9601 Acute respiratory failure with hypoxia: Secondary | ICD-10-CM | POA: Diagnosis not present

## 2018-11-12 DIAGNOSIS — N184 Chronic kidney disease, stage 4 (severe): Secondary | ICD-10-CM | POA: Diagnosis not present

## 2018-11-12 DIAGNOSIS — R1310 Dysphagia, unspecified: Secondary | ICD-10-CM | POA: Diagnosis not present

## 2018-11-12 DIAGNOSIS — J189 Pneumonia, unspecified organism: Secondary | ICD-10-CM | POA: Diagnosis not present

## 2018-11-12 LAB — CBC WITH DIFFERENTIAL/PLATELET
Abs Immature Granulocytes: 0.01 10*3/uL (ref 0.00–0.07)
Basophils Absolute: 0 10*3/uL (ref 0.0–0.1)
Basophils Relative: 1 %
Eosinophils Absolute: 0 10*3/uL (ref 0.0–0.5)
Eosinophils Relative: 1 %
HCT: 30.3 % — ABNORMAL LOW (ref 36.0–46.0)
Hemoglobin: 8.8 g/dL — ABNORMAL LOW (ref 12.0–15.0)
Immature Granulocytes: 0 %
Lymphocytes Relative: 40 %
Lymphs Abs: 2.1 10*3/uL (ref 0.7–4.0)
MCH: 26.5 pg (ref 26.0–34.0)
MCHC: 29 g/dL — ABNORMAL LOW (ref 30.0–36.0)
MCV: 91.3 fL (ref 80.0–100.0)
Monocytes Absolute: 0.5 10*3/uL (ref 0.1–1.0)
Monocytes Relative: 9 %
Neutro Abs: 2.6 10*3/uL (ref 1.7–7.7)
Neutrophils Relative %: 49 %
Platelets: 189 10*3/uL (ref 150–400)
RBC: 3.32 MIL/uL — ABNORMAL LOW (ref 3.87–5.11)
RDW: 17.9 % — ABNORMAL HIGH (ref 11.5–15.5)
WBC: 5.3 10*3/uL (ref 4.0–10.5)
nRBC: 0 % (ref 0.0–0.2)

## 2018-11-12 LAB — COMPREHENSIVE METABOLIC PANEL
ALT: 39 U/L (ref 0–44)
AST: 70 U/L — ABNORMAL HIGH (ref 15–41)
Albumin: 1.5 g/dL — ABNORMAL LOW (ref 3.5–5.0)
Alkaline Phosphatase: 377 U/L — ABNORMAL HIGH (ref 38–126)
Anion gap: 3 — ABNORMAL LOW (ref 5–15)
BUN: 22 mg/dL (ref 8–23)
CO2: 21 mmol/L — ABNORMAL LOW (ref 22–32)
Calcium: 7.7 mg/dL — ABNORMAL LOW (ref 8.9–10.3)
Chloride: 128 mmol/L — ABNORMAL HIGH (ref 98–111)
Creatinine, Ser: 1.89 mg/dL — ABNORMAL HIGH (ref 0.44–1.00)
GFR calc Af Amer: 29 mL/min — ABNORMAL LOW (ref >60)
GFR calc non Af Amer: 25 mL/min — ABNORMAL LOW (ref >60)
Glucose, Bld: 81 mg/dL (ref 70–99)
Potassium: 4.4 mmol/L (ref 3.5–5.1)
Sodium: 152 mmol/L — ABNORMAL HIGH (ref 135–145)
Total Bilirubin: 0.9 mg/dL (ref 0.3–1.2)
Total Protein: 5.9 g/dL — ABNORMAL LOW (ref 6.5–8.1)

## 2018-11-12 LAB — URINALYSIS, ROUTINE W REFLEX MICROSCOPIC
Bilirubin Urine: NEGATIVE
Glucose, UA: NEGATIVE mg/dL
Hgb urine dipstick: NEGATIVE
Ketones, ur: NEGATIVE mg/dL
Nitrite: NEGATIVE
Protein, ur: NEGATIVE mg/dL
Specific Gravity, Urine: 1.016 (ref 1.005–1.030)
WBC, UA: >50 WBC/hpf — ABNORMAL HIGH (ref 0–5)
pH: 5 (ref 5.0–8.0)

## 2018-11-12 LAB — LACTIC ACID, PLASMA: Lactic Acid, Venous: 0.9 mmol/L (ref 0.5–1.9)

## 2018-11-12 LAB — SARS CORONAVIRUS 2 BY RT PCR (HOSPITAL ORDER, PERFORMED IN ~~LOC~~ HOSPITAL LAB): SARS Coronavirus 2: NEGATIVE

## 2018-11-12 MED ORDER — SODIUM CHLORIDE 0.9 % IV BOLUS: 500.0000 mL | Freq: Once | INTRAVENOUS | Status: DC

## 2018-11-12 MED ORDER — SODIUM CHLORIDE 0.9 % IV BOLUS
250.0000 mL | Freq: Once | INTRAVENOUS | Status: AC
  Administered 2018-11-12: 250 mL via INTRAVENOUS

## 2018-11-12 MED ORDER — CEPHALEXIN 500 MG PO CAPS
500.0000 mg | ORAL_CAPSULE | Freq: Once | ORAL | Status: AC
  Administered 2018-11-12: 500 mg via ORAL
  Filled 2018-11-12: qty 1

## 2018-11-12 MED ORDER — SULFAMETHOXAZOLE-TRIMETHOPRIM 800-160 MG PO TABS: 1.0000 | ORAL_TABLET | Freq: Once | ORAL | Status: DC

## 2018-11-12 MED ORDER — FLUCONAZOLE 150 MG PO TABS
150.0000 mg | ORAL_TABLET | Freq: Once | ORAL | Status: AC
  Administered 2018-11-12: 150 mg via ORAL
  Filled 2018-11-12: qty 1

## 2018-11-12 MED ORDER — CEPHALEXIN 500 MG PO CAPS: 500.0000 mg | ORAL_CAPSULE | Freq: Two times a day (BID) | ORAL | 0 refills | Status: DC

## 2018-11-12 NOTE — Telephone Encounter (Signed)
Call to [name], nephew.  Left message that patient does not need to be on furosemide at this time and there are no medication changes.  Advised if patient has any fluid symptoms to call back. ICM remote transmission recheck 11/18/2018.

## 2018-11-12 NOTE — Telephone Encounter (Signed)
Cr was quite a bit worse on last check 7/10 (1.2-->1.89), also recent issues with low potassium. If asymptomatic I would hold off on any diuretic at this time.     Zandra Abts MD

## 2018-11-12 NOTE — Progress Notes (Signed)
Arnoldo Lenis, MD  Physician  Specialty:  Cardiology  Telephone Encounter  Signed  Creation Time:  11/12/2018 11:55 AM          Signed        Cr was quite a bit worse on last check 7/10 (1.2-->1.89), also recent issues with low potassium. If asymptomatic I would hold off on any diuretic at this time.     Zandra Abts MD

## 2018-11-12 NOTE — Discharge Instructions (Addendum)
You are not finding any signs of infection during your emergency department work-up today.  You have not had fever while you are here.  We are seeing some elevations in your electrolytes and your creatinine.  These are concerning for worsening kidney function.  I recommend close follow-up with your primary care provider for recheck of your labs by the end of the week or early next week.  You may also end up needing to see a nephrologist.  Please return to the emergency department if you develop any new or worsening symptoms.  A social worker should be reaching out to you to arrange transportation so you can get to doctor's appointments.

## 2018-11-12 NOTE — ED Notes (Signed)
Pt denies pain, states she only came because the nurse told her she had a fever.

## 2018-11-12 NOTE — Telephone Encounter (Signed)
ICM call to [name], patient's nephew on 11/11/2018.    Patient hospitalized in May followed by inpatient rehab until 10/25/2018.  ED visit on 7/10 with a dx of hypokalemia.   Family is caring for patient at home now and patient unable to walk due to weakness.   Sx: Asymptomatic  Report: Optivol thoracic impedance suggesting possible ongoing fluid accumulation since 10/17/2018.  MEDS: Per nephew, patient is not prescribed Lasix and is not on current Epic Med list.  Per Epic notes, Furosemide and Potassium were stopped at time of May hospital discharge  Potassium was restarted on 7/10 and listed in Epic med list due to dx of Hypokalemia at 7/10 ED visit.  Labs:  10/31/2018 Creatinine 1.89, BUN 24, Potassium 3.0, Sodium 147, GFR 25-29  10/24/2018 Creatinine 1.25, BUN 14, Potassium 3.8, Sodium 141, GFR 41-47  10/20/2018 Creatinine 1.21, BUN 13, Potassium 3.3, Sodium 143, GFR 42-49  10/19/2018 Creatinine 1.28, BUN 12, Potassium 4.1, Sodium 140, GFR 39-46  10/17/2018 Creatinine 1.38, BUN 18, Potassium 4.0, Sodium 137, GFR 36-42   Follow-up plan: ICM clinic phone appointment on 11/18/2018 to recheck fluid levels.    Dr Harl Bowie is covering for Dr [name] this week and note sent for review and recommendations if needed on the following:   1. Optivol report is suggesting fluid accumulation since 6/26 2. No longer prescribed Furosemide.  3. Currently taking Potassium 20 mEq daily since 7/10  3 month ICM trend: 11/10/2018    1 Year ICM trend:

## 2018-11-12 NOTE — ED Triage Notes (Signed)
Ems reports they were called to residence by [place] for pt temp 101.4. pt recently had abd surgery. Temp per ems 99.0.

## 2018-11-12 NOTE — Progress Notes (Signed)
Call to [name], nephew.  Left message that patient does not need to be on furosemide at this time and there are no medication changes.  Advised if patient has any fluid symptoms to call back. ICM remote transmission recheck 11/18/2018.

## 2018-11-12 NOTE — Progress Notes (Signed)
Received voice mail message from nephew [name].  Patient is not currently taking Furosemide.  Per Epic notes, it appears both Furosemide and Potassium were stopped at time of hospital discharge in May but Potassium 20 mEq 1 tablet daily was restarted at 7/10 hospital discharge (July hospital dx was Hypokalemia).   Phone note sent to Dr Harl Bowie (covering for Dr [name]) for review and recommendations 1. Optivol report is suggesting fluid accumulation since 6/26 2. No longer prescribed Furosemide.  3. Currently taking Potassium 20 mEq daily

## 2018-11-12 NOTE — ED Provider Notes (Signed)
[facility] EMERGENCY DEPARTMENT Provider Note   CSN: [id number] Arrival date & time: 11/12/18  1343    History   Chief Complaint Chief Complaint  Patient presents with   Fever    HPI [name] is a 78 y.o. female with history of hypertension, CKD, GERD, CAD who presents with reported fever that was taken by home health nurse today.  Patient denies any pain.  She has had a chronic wound in her abdomen since her exploratory laparotomy surgery this past April.  She denies any significant pain in this area or in her abdomen.  She reportedly had a fever up to 101.4.  She denies any difficulty using the bathroom for urinary symptoms.  She denies any cough, shortness of breath, chest pain.  Patient is very hard of hearing.     HPI  Past Medical History:  Diagnosis Date   Anemia    Hgb of 9-10   Anemia of chronic disease    Hgb of 9-10 chronically; 06/2010: H&H-10.7/33.5, MCV-81, normal iron studies in 2010    Arteriosclerotic cardiovascular disease (ASCVD)    Remote PTCA by patient report; LBBB; associated cardiomyopathy, presumed ischemic with EF 40-45% previously, 20% in 06/2009; h/o clinical congestive heart failure; negative stress nuclear in 2009 with inferoseptal and apical scar   Automatic implantable cardioverter-defibrillator in situ    Breast cancer (HCC)    breast   Chronic bronchitis (HCC)    Chronic renal disease, stage 3, moderately decreased glomerular filtration rate (GFR) between 30-59 mL/min/1.73 square meter (HCC) 02/01/2016   Congestive heart failure (CHF) (HCC)    Elevated cholesterol    Elevated sed rate    GERD (gastroesophageal reflux disease)    GI bleed    Gout    HOH (hard of hearing)    Hyperlipidemia    Hypertension    Rheumatoid arthritis ([place])    UTI (urinary tract infection)     Patient Active Problem List   Diagnosis Date Noted   Goals of care, counseling/discussion    Palliative care by specialist    Acute  respiratory failure with hypoxia ([place])    Protein-calorie malnutrition, severe 08/25/2018   Wide-complex tachycardia ([place])    Acute renal failure superimposed on stage 3 chronic kidney disease ([place]) 08/18/2018   SBO (small bowel obstruction) ([place]) 08/14/2018   Pressure injury of skin 08/14/2018   CAP (community acquired pneumonia) 08/13/2018   AKI (acute kidney injury) ([place]) 08/10/2017   Hyperkalemia 08/10/2017   Acute blood loss anemia 09/20/2016   Ischemic cardiomyopathy 09/20/2016   Chronic combined systolic and diastolic CHF (congestive heart failure) ([place]) 09/20/2016   CKD (chronic kidney disease), stage IV ([place]) 09/20/2016   History of breast cancer 08/02/2016   Primary osteoarthritis of both knees 08/02/2016   Primary osteoarthritis of both feet 08/02/2016   History of anemia 07/17/2016   History of CHF (congestive heart failure) 06/04/2016   History of chronic kidney disease 06/04/2016   Symptomatic anemia 06/04/2016   Elevated sedimentation rate 03/03/2016   Idiopathic chronic gout of multiple sites without tophus 03/03/2016   Total knee replacement status, right 03/03/2016   High risk medication use 03/03/2016   Chronic kidney disease (CKD) stage G3b/A1, moderately decreased glomerular filtration rate (GFR) between 30-44 mL/min/1.73 square meter and albuminuria creatinine ratio less than 30 mg/g ([place]) 02/01/2016   CAD S/P remote PCI- no details 09/02/2014   Cardiomyopathy, ischemic-EF 30-35% March 2015 76/19/5093   Diastolic dysfunction-grade 2 09/02/2014   CHF exacerbation ([place])  08/28/2014   Acute on chronic combined systolic and diastolic congestive heart failure ([place]) 08/28/2014   Iron deficiency anemia 08/20/2013   Malnutrition of moderate degree ([place]) 07/21/2013   PNA (pneumonia) 07/19/2013   HCAP (healthcare-associated pneumonia) 07/17/2013   Sepsis ([place]) 07/17/2013   ARF (acute renal failure) ([place]) 07/17/2013   Rheumatoid  arthritis ([place]) 07/04/2013   Hypokalemia 07/03/2013   Chest pain 07/03/2013   Biventricular ICD in place (MDT 2014) 11/06/2012   Anemia of chronic disease    Breast cancer ([place])    Hypertension    Dyslipidemia 41/28/7867   Chronic systolic heart failure ([place]) 05/24/2009   Primary osteoarthritis of both hands 08/11/2007    Past Surgical History:  Procedure Laterality Date   BI-VENTRICULAR IMPLANTABLE CARDIOVERTER DEFIBRILLATOR N/A 07/28/2012   Procedure: BI-VENTRICULAR IMPLANTABLE CARDIOVERTER DEFIBRILLATOR  (CRT-D);  Surgeon: Evans Lance, MD;  [facility] LAB;  Service: Cardiovascular;  Laterality: N/A;   BI-VENTRICULAR IMPLANTABLE CARDIOVERTER DEFIBRILLATOR  (CRT-D)  07/28/2012   BOWEL RESECTION N/A 08/21/2018   Procedure: Small Bowel Resection;  Surgeon: Jovita Kussmaul, MD;  Location: [place];  Service: General;  Laterality: N/A;   BREAST BIOPSY Bilateral    CATARACT EXTRACTION W/ INTRAOCULAR LENS IMPLANT Left    CATARACT EXTRACTION W/PHACO Right 09/15/2013   Procedure: CATARACT EXTRACTION PHACO AND INTRAOCULAR LENS PLACEMENT ([place]);  Surgeon: Elta Guadeloupe T. Gershon Crane, MD;  Location: [facility] ORS;  Service: Ophthalmology;  Laterality: Right;  CDE:  10.74   COLONOSCOPY N/A 03/04/2015   Procedure: COLONOSCOPY;  Surgeon: Rogene Houston, MD;  Location: [facility] ENDO SU[facility];  Service: Endoscopy;  Laterality: N/A;  10:50    ESOPHAGOGASTRODUODENOSCOPY N/A 03/04/2015   Procedure: ESOPHAGOGASTRODUODENOSCOPY (EGD);  Surgeon: Rogene Houston, MD;  [facility];  Service: Endoscopy;  Laterality: N/A;   ESOPHAGOGASTRODUODENOSCOPY N/A 09/21/2016   Procedure: ESOPHAGOGASTRODUODENOSCOPY (EGD);  Surgeon: Rogene Houston, MD;  [facility];  Service: Endoscopy;  Laterality: N/A;   GIVENS CAPSULE STUDY N/A 09/22/2016   Procedure: GIVENS CAPSULE STUDY;  Surgeon: Rogene Houston, MD;  [facility];  Service: Endoscopy;  Laterality: N/A;   LAPAROTOMY N/A 08/21/2018    Procedure: EXPLORATORY LAPAROTOMY WITH LYSIS OF ADHESIONS;  Surgeon: Jovita Kussmaul, MD;  Location: [place];  Service: General;  Laterality: N/A;   MASTECTOMY Left 1998   TENDON TRANSFER Right 08/15/2017   Procedure: TENDON TRANSFER RIGHT WRIST EXTENSORS AS NEEDED, ULNA RESECTION AND STABILIZATION;  Surgeon: Charlotte Crumb, MD;  Location: [place];  Service: Orthopedics;  Laterality: Right;   TOTAL KNEE ARTHROPLASTY Right    Dr.Harrison   TUBAL LIGATION       OB History    Gravida  1   Para  1   Term  1   Preterm      AB      Living  0     SAB      TAB      Ectopic      Multiple      Live Births               Home Medications    Prior to Admission medications   Medication Sig Start Date End Date Taking? Authorizing Provider  acetaminophen (TYLENOL) 325 MG tablet Take 2 tablets (650 mg total) by mouth every 6 (six) hours as needed for mild pain (or Fever >/= 101). 09/19/18   Cherylann Ratel A, DO  acetaminophen (TYLENOL) 500 MG tablet Take 500 mg by mouth every  6 (six) hours as needed (FOR PAIN/HEADACHES).    [provider]  acetaminophen (TYLENOL) 650 MG suppository Place 1 suppository (650 mg total) rectally every 6 (six) hours as needed for mild pain (or Fever >/= 101). 09/19/18   Cherylann Ratel A, DO  albuterol (PROVENTIL) (2.5 MG/3ML) 0.083% nebulizer solution Take 3 mLs (2.5 mg total) by nebulization every 4 (four) hours as needed for wheezing or shortness of breath. 09/19/18   Marylyn Ishihara, Tyrone A, DO  Amino Acids-Protein Hydrolys (FEEDING SUPPLEMENT, PRO-STAT SUGAR FREE 64,) LIQD Place 30 mLs into feeding tube 2 (two) times daily. 09/19/18   Cherylann Ratel A, DO  amiodarone (PACERONE) 200 MG tablet Take 1 tablet (200 mg total) by mouth daily. 09/20/18   Cherylann Ratel A, DO  carvedilol (COREG) 3.125 MG tablet Take 1 tablet (3.125 mg total) by mouth 2 (two) times daily with a meal. 09/19/18   Marylyn Ishihara, Tyrone A, DO  cephALEXin (KEFLEX) 500 MG capsule Take 1 capsule (500 mg  total) by mouth 2 (two) times daily. 11/12/18   Milon Dethloff, Bea Graff, PA-C  chlorhexidine (PERIDEX) 0.12 % solution 15 mLs by Mouth Rinse route 2 (two) times daily. 09/19/18   Cherylann Ratel A, DO  enoxaparin (LOVENOX) 40 MG/0.4ML injection Inject 40 mg into the skin daily. 10/28/18   [provider]  insulin aspart (NOVOLOG) 100 UNIT/ML injection Inject 0-20 Units into the skin every 4 (four) hours. 09/19/18   Cherylann Ratel A, DO  mirtazapine (REMERON) 15 MG tablet Take 15 mg by mouth every evening. 10/27/18   [provider]  mouth rinse LIQD solution 15 mLs by Mouth Rinse route 2 times daily at 12 noon and 4 pm. 09/19/18   Cherylann Ratel A, DO  mupirocin ointment (BACTROBAN) 2 % Place into the nose 2 (two) times daily. 09/19/18   Cherylann Ratel A, DO  Nutritional Supplements (FEEDING SUPPLEMENT, OSMOLITE 1.5 CAL,) LIQD Place 1,000 mLs into feeding tube continuous. 09/19/18   Cherylann Ratel A, DO  ondansetron (ZOFRAN) 4 MG/2ML SOLN injection Inject 2 mLs (4 mg total) into the vein every 6 (six) hours as needed for nausea. 09/19/18   Cherylann Ratel A, DO  ondansetron (ZOFRAN-ODT) 4 MG disintegrating tablet Take 1 tablet (4 mg total) by mouth every 6 (six) hours as needed for nausea. 09/19/18   Cherylann Ratel A, DO  oxyCODONE (OXY IR/ROXICODONE) 5 MG immediate release tablet Take 1-2 tablets (5-10 mg total) by mouth every 4 (four) hours as needed for moderate pain or severe pain (5mg  for moderate pain, 10mg  for severe pain). 09/19/18   Cherylann Ratel A, DO  pantoprazole (PROTONIX) 40 MG tablet Take 1 tablet (40 mg total) by mouth daily. 09/20/18   Cherylann Ratel A, DO  potassium chloride SA (K-DUR) 20 MEQ tablet Take 1 tablet (20 mEq total) by mouth daily. 10/31/18   Milton Ferguson, MD  predniSONE (DELTASONE) 20 MG tablet Take 2 tablets (40 mg total) by mouth daily with breakfast. 09/20/18   Cherylann Ratel A, DO  saccharomyces boulardii (FLORASTOR) 250 MG capsule Take 1 capsule (250 mg total) by mouth 2 (two) times daily.  09/19/18   Jonnie Finner, DO    Family History Family History  Problem Relation Age of Onset   Diabetes Father    Pancreatic cancer Father    Hypertension Brother     Social History Social History   Tobacco Use   Smoking status: Former Smoker    Packs/day: 0.25    Years: 30.00  Pack years: 7.50    Types: Cigarettes    Start date: 09/17/1956    Quit date: 04/24/2007    Years since quitting: 11.5   Smokeless tobacco: Current User    Types: Chew   Tobacco comment: 07/03/2013 "smoked some; don't know how much or how long or when I quit"  Substance Use Topics   Alcohol use: No    Alcohol/week: 0.0 standard drinks   Drug use: No     Allergies   Macrobid [nitrofurantoin macrocrystal]   Review of Systems Review of Systems  Constitutional: Positive for fever. Negative for chills.  HENT: Negative for facial swelling and sore throat.   Respiratory: Negative for shortness of breath.   Cardiovascular: Negative for chest pain.  Gastrointestinal: Negative for abdominal pain, nausea and vomiting.  Genitourinary: Negative for dysuria.  Musculoskeletal: Negative for back pain.  Skin: Negative for rash and wound.  Neurological: Negative for headaches.  Psychiatric/Behavioral: The patient is not nervous/anxious.      Physical Exam Updated Vital Signs BP 107/64 (BP Location: Right Wrist)    Pulse 71    Temp 97.8 F (36.6 C) (Oral)    Resp 19    Ht 5\' 3"  (1.6 m)    Wt 60.7 kg    SpO2 100%    BMI 23.71 kg/m   Physical Exam Vitals signs and nursing note reviewed.  Constitutional:      General: She is not in acute distress.    Appearance: She is well-developed. She is not diaphoretic.  HENT:     Head: Normocephalic and atraumatic.     Mouth/Throat:     Pharynx: No oropharyngeal exudate.  Eyes:     General: No scleral icterus.       Right eye: No discharge.        Left eye: No discharge.     Conjunctiva/sclera: Conjunctivae normal.     Pupils: Pupils are equal,  round, and reactive to light.  Neck:     Musculoskeletal: Normal range of motion and neck supple.     Thyroid: No thyromegaly.  Cardiovascular:     Rate and Rhythm: Normal rate and regular rhythm.     Heart sounds: Normal heart sounds. No murmur. No friction rub. No gallop.   Pulmonary:     Effort: Pulmonary effort is normal. No respiratory distress.     Breath sounds: Normal breath sounds. No stridor. No wheezing or rales.  Abdominal:     General: Bowel sounds are normal. There is no distension.     Palpations: Abdomen is soft.     Tenderness: There is no abdominal tenderness. There is no guarding or rebound.     Comments: Chronic midline abdominal wound with no erythema, some yellow drainage with foul odor on dressing  Lymphadenopathy:     Cervical: No cervical adenopathy.  Skin:    General: Skin is warm and dry.     Coloration: Skin is not pale.     Findings: No rash.  Neurological:     Mental Status: She is alert.     Coordination: Coordination normal.      ED Treatments / Results  Labs (all labs ordered are listed, but only abnormal results are displayed) Labs Reviewed  COMPREHENSIVE METABOLIC PANEL - Abnormal; Notable for the following components:      Result Value   Sodium 152 (*)    Chloride 128 (*)    CO2 21 (*)    Creatinine, Ser 1.89 (*)  Calcium 7.7 (*)    Total Protein 5.9 (*)    Albumin 1.5 (*)    AST 70 (*)    Alkaline Phosphatase 377 (*)    GFR calc non Af Amer 25 (*)    GFR calc Af Amer 29 (*)    Anion gap 3 (*)    All other components within normal limits  CBC WITH DIFFERENTIAL/PLATELET - Abnormal; Notable for the following components:   RBC 3.32 (*)    Hemoglobin 8.8 (*)    HCT 30.3 (*)    MCHC 29.0 (*)    RDW 17.9 (*)    All other components within normal limits  URINALYSIS, ROUTINE W REFLEX MICROSCOPIC - Abnormal; Notable for the following components:   Color, Urine AMBER (*)    APPearance HAZY (*)    Leukocytes,Ua MODERATE (*)    WBC,  UA >50 (*)    Bacteria, UA RARE (*)    All other components within normal limits  CULTURE, BLOOD (ROUTINE X 2)  SARS CORONAVIRUS 2 (HOSPITAL ORDER, [place] LAB)  URINE CULTURE  CULTURE, BLOOD (ROUTINE X 2) W REFLEX TO ID PANEL  LACTIC ACID, PLASMA    EKG None  Radiology Dg Chest Portable 1 View  Result Date: 11/12/2018 CLINICAL DATA:  Fever. EXAM: PORTABLE CHEST 1 VIEW COMPARISON:  Chest x-ray dated 10/31/2018 and chest CT dated 08/31/2015 FINDINGS: Chronic cardiomegaly. Aortic atherosclerosis. AICD in place. Pulmonary vascularity is normal. Lungs are clear. No acute bone abnormality. IMPRESSION: 1. No acute cardiopulmonary disease. Chronic cardiomegaly. 2. Aortic atherosclerosis. Electronically Signed   By: Lorriane Shire M.D.   On: 11/12/2018 15:46    Procedures Procedures (including critical care time)  Medications Ordered in ED Medications  fluconazole (DIFLUCAN) tablet 150 mg (has no administration in time range)  cephALEXin (KEFLEX) capsule 500 mg (has no administration in time range)  sodium chloride 0.9 % bolus 250 mL (0 mLs Intravenous Stopped 11/12/18 2155)     Initial Impression / Assessment and Plan / ED Course  I have reviewed the triage vital signs and the nursing notes.  Pertinent labs & imaging results that were available during my care of the patient were reviewed by me and considered in my medical decision making (see chart for details).        Patient presenting from home after home health nurse measured an elevated temperature.  Patient has not been febrile in the ED.  Patient is found to have UTI.  Patient is very hard of hearing and is hard to get a reliable answer whether she has urinary symptoms.  She has history of UTIs in the past.  Last urine culture was sensitive to Keflex.  Will treat with Keflex as well as Diflucan considering budding yeast seen on UA.  Urine culture sent as well as blood cultures.  Patient has worsening  kidney function progressively worsening since June.  I discussed this with her nephew as well as her husband and advised that she will need to have her lab work rechecked within the week and potentially see a nephrologist.  Sodium 152, chloride 128, creatinine 1.89.  Patient given small fluid bolus in the ED today.  Patient is unable to ride in a car since her abdominal surgery and is apparently not allowed due to her open wound.  This is been prohibiting her from going to doctor's appointments, per her husband.  I have placed a social work consult to hopefully resolve this issue for the  patient and her husband.  Do not feel patient meets criteria for admission today.  No signs of sepsis.  Patient has been afebrile throughout ED course without any complaints.  Will discharge home with strict return precautions including recurrent fevers, pain, passing out, or any other concerning symptoms.  Patient's husband and nephew understand and agree with plan.  I discussed plans with patient as well and she is agreeable.  Patient will be transported home via PHR.  Patient also evaluated by my attending, Dr. Laverta Baltimore, who guided the patient's management and agrees with plan.  Final Clinical Impressions(s) / ED Diagnoses   Final diagnoses:  Hypernatremia  Elevated serum creatinine    ED Discharge Orders         Ordered    cephALEXin (KEFLEX) 500 MG capsule  2 times daily     11/12/18 2123           Frederica Kuster, PA-C 11/12/18 2205    Margette Fast, MD 11/13/18 [phone]

## 2018-11-13 DIAGNOSIS — Z853 Personal history of malignant neoplasm of breast: Secondary | ICD-10-CM | POA: Diagnosis not present

## 2018-11-13 DIAGNOSIS — Z760 Encounter for issue of repeat prescription: Secondary | ICD-10-CM | POA: Diagnosis not present

## 2018-11-13 DIAGNOSIS — E46 Unspecified protein-calorie malnutrition: Secondary | ICD-10-CM | POA: Diagnosis not present

## 2018-11-13 DIAGNOSIS — Z95810 Presence of automatic (implantable) cardiac defibrillator: Secondary | ICD-10-CM | POA: Diagnosis not present

## 2018-11-13 DIAGNOSIS — L89312 Pressure ulcer of right buttock, stage 2: Secondary | ICD-10-CM | POA: Diagnosis not present

## 2018-11-13 DIAGNOSIS — J189 Pneumonia, unspecified organism: Secondary | ICD-10-CM | POA: Diagnosis not present

## 2018-11-13 DIAGNOSIS — R945 Abnormal results of liver function studies: Secondary | ICD-10-CM | POA: Diagnosis not present

## 2018-11-13 DIAGNOSIS — M6281 Muscle weakness (generalized): Secondary | ICD-10-CM | POA: Diagnosis not present

## 2018-11-13 DIAGNOSIS — E871 Hypo-osmolality and hyponatremia: Secondary | ICD-10-CM | POA: Diagnosis not present

## 2018-11-13 DIAGNOSIS — I129 Hypertensive chronic kidney disease with stage 1 through stage 4 chronic kidney disease, or unspecified chronic kidney disease: Secondary | ICD-10-CM | POA: Diagnosis not present

## 2018-11-13 DIAGNOSIS — Z4800 Encounter for change or removal of nonsurgical wound dressing: Secondary | ICD-10-CM | POA: Diagnosis not present

## 2018-11-13 DIAGNOSIS — N183 Chronic kidney disease, stage 3 (moderate): Secondary | ICD-10-CM | POA: Diagnosis not present

## 2018-11-13 DIAGNOSIS — K913 Postprocedural intestinal obstruction, unspecified as to partial versus complete: Secondary | ICD-10-CM | POA: Diagnosis not present

## 2018-11-13 DIAGNOSIS — I509 Heart failure, unspecified: Secondary | ICD-10-CM | POA: Diagnosis not present

## 2018-11-13 DIAGNOSIS — K219 Gastro-esophageal reflux disease without esophagitis: Secondary | ICD-10-CM | POA: Diagnosis not present

## 2018-11-13 DIAGNOSIS — J42 Unspecified chronic bronchitis: Secondary | ICD-10-CM | POA: Diagnosis not present

## 2018-11-13 DIAGNOSIS — R1310 Dysphagia, unspecified: Secondary | ICD-10-CM | POA: Diagnosis not present

## 2018-11-13 DIAGNOSIS — J9601 Acute respiratory failure with hypoxia: Secondary | ICD-10-CM | POA: Diagnosis not present

## 2018-11-13 DIAGNOSIS — D638 Anemia in other chronic diseases classified elsewhere: Secondary | ICD-10-CM | POA: Diagnosis not present

## 2018-11-14 DIAGNOSIS — J42 Unspecified chronic bronchitis: Secondary | ICD-10-CM | POA: Diagnosis not present

## 2018-11-14 DIAGNOSIS — E871 Hypo-osmolality and hyponatremia: Secondary | ICD-10-CM | POA: Diagnosis not present

## 2018-11-14 DIAGNOSIS — M6281 Muscle weakness (generalized): Secondary | ICD-10-CM | POA: Diagnosis not present

## 2018-11-14 DIAGNOSIS — N183 Chronic kidney disease, stage 3 (moderate): Secondary | ICD-10-CM | POA: Diagnosis not present

## 2018-11-14 DIAGNOSIS — K913 Postprocedural intestinal obstruction, unspecified as to partial versus complete: Secondary | ICD-10-CM | POA: Diagnosis not present

## 2018-11-14 DIAGNOSIS — Z4800 Encounter for change or removal of nonsurgical wound dressing: Secondary | ICD-10-CM | POA: Diagnosis not present

## 2018-11-14 DIAGNOSIS — E46 Unspecified protein-calorie malnutrition: Secondary | ICD-10-CM | POA: Diagnosis not present

## 2018-11-14 DIAGNOSIS — Z853 Personal history of malignant neoplasm of breast: Secondary | ICD-10-CM | POA: Diagnosis not present

## 2018-11-14 DIAGNOSIS — Z760 Encounter for issue of repeat prescription: Secondary | ICD-10-CM | POA: Diagnosis not present

## 2018-11-14 DIAGNOSIS — Z95810 Presence of automatic (implantable) cardiac defibrillator: Secondary | ICD-10-CM | POA: Diagnosis not present

## 2018-11-14 DIAGNOSIS — I129 Hypertensive chronic kidney disease with stage 1 through stage 4 chronic kidney disease, or unspecified chronic kidney disease: Secondary | ICD-10-CM | POA: Diagnosis not present

## 2018-11-14 DIAGNOSIS — L89312 Pressure ulcer of right buttock, stage 2: Secondary | ICD-10-CM | POA: Diagnosis not present

## 2018-11-14 DIAGNOSIS — R1310 Dysphagia, unspecified: Secondary | ICD-10-CM | POA: Diagnosis not present

## 2018-11-14 DIAGNOSIS — J189 Pneumonia, unspecified organism: Secondary | ICD-10-CM | POA: Diagnosis not present

## 2018-11-14 DIAGNOSIS — I509 Heart failure, unspecified: Secondary | ICD-10-CM | POA: Diagnosis not present

## 2018-11-14 DIAGNOSIS — K219 Gastro-esophageal reflux disease without esophagitis: Secondary | ICD-10-CM | POA: Diagnosis not present

## 2018-11-14 DIAGNOSIS — J9601 Acute respiratory failure with hypoxia: Secondary | ICD-10-CM | POA: Diagnosis not present

## 2018-11-14 DIAGNOSIS — R945 Abnormal results of liver function studies: Secondary | ICD-10-CM | POA: Diagnosis not present

## 2018-11-14 DIAGNOSIS — D638 Anemia in other chronic diseases classified elsewhere: Secondary | ICD-10-CM | POA: Diagnosis not present

## 2018-11-14 LAB — URINE CULTURE: Culture: 70000 — AB

## 2018-11-17 DIAGNOSIS — E871 Hypo-osmolality and hyponatremia: Secondary | ICD-10-CM | POA: Diagnosis not present

## 2018-11-17 DIAGNOSIS — K913 Postprocedural intestinal obstruction, unspecified as to partial versus complete: Secondary | ICD-10-CM | POA: Diagnosis not present

## 2018-11-17 DIAGNOSIS — E46 Unspecified protein-calorie malnutrition: Secondary | ICD-10-CM | POA: Diagnosis not present

## 2018-11-17 DIAGNOSIS — K219 Gastro-esophageal reflux disease without esophagitis: Secondary | ICD-10-CM | POA: Diagnosis not present

## 2018-11-17 DIAGNOSIS — I509 Heart failure, unspecified: Secondary | ICD-10-CM | POA: Diagnosis not present

## 2018-11-17 DIAGNOSIS — M6281 Muscle weakness (generalized): Secondary | ICD-10-CM | POA: Diagnosis not present

## 2018-11-17 DIAGNOSIS — J9601 Acute respiratory failure with hypoxia: Secondary | ICD-10-CM | POA: Diagnosis not present

## 2018-11-17 DIAGNOSIS — R1310 Dysphagia, unspecified: Secondary | ICD-10-CM | POA: Diagnosis not present

## 2018-11-17 DIAGNOSIS — D638 Anemia in other chronic diseases classified elsewhere: Secondary | ICD-10-CM | POA: Diagnosis not present

## 2018-11-17 DIAGNOSIS — J42 Unspecified chronic bronchitis: Secondary | ICD-10-CM | POA: Diagnosis not present

## 2018-11-17 DIAGNOSIS — I129 Hypertensive chronic kidney disease with stage 1 through stage 4 chronic kidney disease, or unspecified chronic kidney disease: Secondary | ICD-10-CM | POA: Diagnosis not present

## 2018-11-17 DIAGNOSIS — S31102A Unspecified open wound of abdominal wall, epigastric region without penetration into peritoneal cavity, initial encounter: Secondary | ICD-10-CM | POA: Diagnosis not present

## 2018-11-17 DIAGNOSIS — S51801A Unspecified open wound of right forearm, initial encounter: Secondary | ICD-10-CM | POA: Diagnosis not present

## 2018-11-17 DIAGNOSIS — Z760 Encounter for issue of repeat prescription: Secondary | ICD-10-CM | POA: Diagnosis not present

## 2018-11-17 DIAGNOSIS — Z4800 Encounter for change or removal of nonsurgical wound dressing: Secondary | ICD-10-CM | POA: Diagnosis not present

## 2018-11-17 DIAGNOSIS — L89312 Pressure ulcer of right buttock, stage 2: Secondary | ICD-10-CM | POA: Diagnosis not present

## 2018-11-17 DIAGNOSIS — Z95810 Presence of automatic (implantable) cardiac defibrillator: Secondary | ICD-10-CM | POA: Diagnosis not present

## 2018-11-17 DIAGNOSIS — Z853 Personal history of malignant neoplasm of breast: Secondary | ICD-10-CM | POA: Diagnosis not present

## 2018-11-17 DIAGNOSIS — J189 Pneumonia, unspecified organism: Secondary | ICD-10-CM | POA: Diagnosis not present

## 2018-11-17 DIAGNOSIS — R945 Abnormal results of liver function studies: Secondary | ICD-10-CM | POA: Diagnosis not present

## 2018-11-17 DIAGNOSIS — N183 Chronic kidney disease, stage 3 (moderate): Secondary | ICD-10-CM | POA: Diagnosis not present

## 2018-11-17 LAB — CULTURE, BLOOD (ROUTINE X 2)
Culture: NO GROWTH
Culture: NO GROWTH

## 2018-11-18 ENCOUNTER — Other Ambulatory Visit: Payer: Self-pay | Admitting: *Deleted

## 2018-11-18 ENCOUNTER — Encounter: Payer: Self-pay | Admitting: *Deleted

## 2018-11-18 ENCOUNTER — Ambulatory Visit (INDEPENDENT_AMBULATORY_CARE_PROVIDER_SITE_OTHER): Payer: Medicare Other

## 2018-11-18 DIAGNOSIS — M069 Rheumatoid arthritis, unspecified: Secondary | ICD-10-CM | POA: Diagnosis not present

## 2018-11-18 DIAGNOSIS — S61402D Unspecified open wound of left hand, subsequent encounter: Secondary | ICD-10-CM | POA: Diagnosis not present

## 2018-11-18 DIAGNOSIS — Z95810 Presence of automatic (implantable) cardiac defibrillator: Secondary | ICD-10-CM

## 2018-11-18 DIAGNOSIS — S31100D Unspecified open wound of abdominal wall, right upper quadrant without penetration into peritoneal cavity, subsequent encounter: Secondary | ICD-10-CM | POA: Diagnosis not present

## 2018-11-18 DIAGNOSIS — I5022 Chronic systolic (congestive) heart failure: Secondary | ICD-10-CM

## 2018-11-18 NOTE — Patient Outreach (Addendum)
[name]) Care Management  11/18/2018  [name] 1941/02/17 [id number]   Subjective: Per referral source, patient gave verbal consent to speak with her husband [name]) regarding healthcare needs as needed. Telephone call to patient's home number, spoke with patient, patient verified name, and patient's husband [name]) also on the phone.   Patient's husband states patient is very hard of hearing, does not hear well on the phone, and patient disconnected from phone call.  Patient's husband, stated patient's name, date of birth, and address.   Discussed [place] Medicare Utilization Management referral  follow up, husband voiced understanding, and is in agreement to follow up on patient's behalf.   Husband states he remembers speaking with referral source, states patient is doing much better, receiving home health services through [facility] ([facility]), and may be discharged from services next week.  Husband states patient is not  able to manage self care and has assistance as needed at this time.   Husband voices understanding of medical diagnosis and treatment plan.  Husband states he is due to return to work next week, needs assistance with personal care services while he is at work, is having more difficulty caring for patient full time, and is currently working with home health social worker for skilled nursing facility placement.  Discussed Advanced Directives, advised of [place] Management Advanced Directives document completion benefit, husband voices understanding,  wants to access benefit at this time, RNCM  advised will asked [facility] Care Management Social Worker to send him document packet and follow up for document completion.  Husband states placement has not been going well to date, is requesting additional assistance with skilled nursing facility placement, is in agreement to referral to [place] Management Social Worker for  skilled nursing facility placement assistance, personal care assistance while waiting for placement, Advanced Directives document follow up and completion.   Husband states he administers all of patient's medications and fills her pill box.  States he is needing assistance with patient's Lovenox and is in agreement with a referral to [place] for Lovenox medication assistance.  States he is accessing patient's  [employer] benefits as needed via member services number on back of card on patient's behalf.   Discussed [facility] Care Management services, husband declined referral to [place] Management Telephonic RNCM for care coordination and disease management.   Husband states patient does not have any education material, transition of care, care coordination, disease management, disease monitoring, or transportation needs at this time.   States he is very appreciative of the follow up and is in agreement to receive [place] Management services on patient's behalf.  RNCM provided contact name and number: [phone] or main office number [phone].    Objective: Per KPN (Knowledge Performance Now, point of care tool) and chart review, patient had an ED visit 11/12/2018 for fever,  ED visit 10/31/2018 for fever,  [name]  hospitalization 09/19/2018 - 10/25/2018 for pneumonia, hospitalized 08/13/2018 -  09/19/2018 for CAP (community acquired pneumonia), SBO (small bowel obstruction).    Patient also has a history of chronic anemia, Pressure injury of skin, Chronic combined systolic and diastolic CHF (congestive heart failure), Wide-complex tachycardia, Protein-calorie malnutrition, severe, Acute respiratory failure with hypoxia,  necrotic bowel, septic shock, CAD, hypertension, Breast cancer,  Chronic bronchitis,  Chronic renal disease, stage 3, Gout, HOH (hard of hearing), Hyperlipidemia, Rheumatoid arthritis ,Breast cancer,  and UTI (urinary tract infection).  Assessment: Received [employer] Utilization Management referral on 11/17/2018.   Referral source: [facility] [place].    Referral reason: Need medication assistance and received verbal consent to speak with patient's husband.   Screening follow up completed, will refer patient to [place] Management Social Worker for skilled nursing facility placement assistance, personal care assistance while waiting for placement, Advanced Directives document follow up and completion.   Will refer patient to [place] for Lovenox medication assistance.    Plan: RNCM will refer patient to [place] Management Social Worker for skilled nursing facility placement assistance, personal care assistance while waiting for placement, Advanced Directives document follow up and completion. RNCM will refer patient to [place] for Lovenox medication assistance.     [name] H. Annia Friendly, BSN, [place] Management [facility] Telephonic CM Phone: [phone] Fax: [phone]

## 2018-11-19 DIAGNOSIS — L89312 Pressure ulcer of right buttock, stage 2: Secondary | ICD-10-CM | POA: Diagnosis not present

## 2018-11-19 DIAGNOSIS — D638 Anemia in other chronic diseases classified elsewhere: Secondary | ICD-10-CM | POA: Diagnosis not present

## 2018-11-19 DIAGNOSIS — R1310 Dysphagia, unspecified: Secondary | ICD-10-CM | POA: Diagnosis not present

## 2018-11-19 DIAGNOSIS — Z4800 Encounter for change or removal of nonsurgical wound dressing: Secondary | ICD-10-CM | POA: Diagnosis not present

## 2018-11-19 DIAGNOSIS — J9601 Acute respiratory failure with hypoxia: Secondary | ICD-10-CM | POA: Diagnosis not present

## 2018-11-19 DIAGNOSIS — J42 Unspecified chronic bronchitis: Secondary | ICD-10-CM | POA: Diagnosis not present

## 2018-11-19 DIAGNOSIS — I129 Hypertensive chronic kidney disease with stage 1 through stage 4 chronic kidney disease, or unspecified chronic kidney disease: Secondary | ICD-10-CM | POA: Diagnosis not present

## 2018-11-19 DIAGNOSIS — K913 Postprocedural intestinal obstruction, unspecified as to partial versus complete: Secondary | ICD-10-CM | POA: Diagnosis not present

## 2018-11-19 DIAGNOSIS — R945 Abnormal results of liver function studies: Secondary | ICD-10-CM | POA: Diagnosis not present

## 2018-11-19 DIAGNOSIS — Z95810 Presence of automatic (implantable) cardiac defibrillator: Secondary | ICD-10-CM | POA: Diagnosis not present

## 2018-11-19 DIAGNOSIS — N183 Chronic kidney disease, stage 3 (moderate): Secondary | ICD-10-CM | POA: Diagnosis not present

## 2018-11-19 DIAGNOSIS — J189 Pneumonia, unspecified organism: Secondary | ICD-10-CM | POA: Diagnosis not present

## 2018-11-19 DIAGNOSIS — I509 Heart failure, unspecified: Secondary | ICD-10-CM | POA: Diagnosis not present

## 2018-11-19 DIAGNOSIS — Z760 Encounter for issue of repeat prescription: Secondary | ICD-10-CM | POA: Diagnosis not present

## 2018-11-19 DIAGNOSIS — K219 Gastro-esophageal reflux disease without esophagitis: Secondary | ICD-10-CM | POA: Diagnosis not present

## 2018-11-19 DIAGNOSIS — Z853 Personal history of malignant neoplasm of breast: Secondary | ICD-10-CM | POA: Diagnosis not present

## 2018-11-19 DIAGNOSIS — E46 Unspecified protein-calorie malnutrition: Secondary | ICD-10-CM | POA: Diagnosis not present

## 2018-11-19 DIAGNOSIS — E871 Hypo-osmolality and hyponatremia: Secondary | ICD-10-CM | POA: Diagnosis not present

## 2018-11-19 DIAGNOSIS — M6281 Muscle weakness (generalized): Secondary | ICD-10-CM | POA: Diagnosis not present

## 2018-11-20 ENCOUNTER — Other Ambulatory Visit: Payer: Self-pay | Admitting: *Deleted

## 2018-11-20 DIAGNOSIS — Z760 Encounter for issue of repeat prescription: Secondary | ICD-10-CM | POA: Diagnosis not present

## 2018-11-20 DIAGNOSIS — K913 Postprocedural intestinal obstruction, unspecified as to partial versus complete: Secondary | ICD-10-CM | POA: Diagnosis not present

## 2018-11-20 DIAGNOSIS — R1310 Dysphagia, unspecified: Secondary | ICD-10-CM | POA: Diagnosis not present

## 2018-11-20 DIAGNOSIS — K219 Gastro-esophageal reflux disease without esophagitis: Secondary | ICD-10-CM | POA: Diagnosis not present

## 2018-11-20 DIAGNOSIS — Z95810 Presence of automatic (implantable) cardiac defibrillator: Secondary | ICD-10-CM | POA: Diagnosis not present

## 2018-11-20 DIAGNOSIS — I129 Hypertensive chronic kidney disease with stage 1 through stage 4 chronic kidney disease, or unspecified chronic kidney disease: Secondary | ICD-10-CM | POA: Diagnosis not present

## 2018-11-20 DIAGNOSIS — N183 Chronic kidney disease, stage 3 (moderate): Secondary | ICD-10-CM | POA: Diagnosis not present

## 2018-11-20 DIAGNOSIS — I509 Heart failure, unspecified: Secondary | ICD-10-CM | POA: Diagnosis not present

## 2018-11-20 DIAGNOSIS — L89312 Pressure ulcer of right buttock, stage 2: Secondary | ICD-10-CM | POA: Diagnosis not present

## 2018-11-20 DIAGNOSIS — J189 Pneumonia, unspecified organism: Secondary | ICD-10-CM | POA: Diagnosis not present

## 2018-11-20 DIAGNOSIS — J9601 Acute respiratory failure with hypoxia: Secondary | ICD-10-CM | POA: Diagnosis not present

## 2018-11-20 DIAGNOSIS — M6281 Muscle weakness (generalized): Secondary | ICD-10-CM | POA: Diagnosis not present

## 2018-11-20 DIAGNOSIS — J9611 Chronic respiratory failure with hypoxia: Secondary | ICD-10-CM | POA: Diagnosis not present

## 2018-11-20 DIAGNOSIS — D638 Anemia in other chronic diseases classified elsewhere: Secondary | ICD-10-CM | POA: Diagnosis not present

## 2018-11-20 DIAGNOSIS — R945 Abnormal results of liver function studies: Secondary | ICD-10-CM | POA: Diagnosis not present

## 2018-11-20 DIAGNOSIS — Z853 Personal history of malignant neoplasm of breast: Secondary | ICD-10-CM | POA: Diagnosis not present

## 2018-11-20 DIAGNOSIS — E871 Hypo-osmolality and hyponatremia: Secondary | ICD-10-CM | POA: Diagnosis not present

## 2018-11-20 DIAGNOSIS — Z4800 Encounter for change or removal of nonsurgical wound dressing: Secondary | ICD-10-CM | POA: Diagnosis not present

## 2018-11-20 DIAGNOSIS — J42 Unspecified chronic bronchitis: Secondary | ICD-10-CM | POA: Diagnosis not present

## 2018-11-20 DIAGNOSIS — E46 Unspecified protein-calorie malnutrition: Secondary | ICD-10-CM | POA: Diagnosis not present

## 2018-11-20 NOTE — Patient Outreach (Signed)
[facility]) Care Management  11/20/2018  [name] 26-Mar-1941 [id number]   CSW made contact with pt's spouse by phone and confirmed pt's identity.  CSW introduced self and reason for call- per husband,his wife is "bedridden" and he is the primary caregiver. He is wanting to pursue some private duty care assistance and is open to resources for this.    CSW has also discussed with him the possibility of placing wife in a nursing facility however for now he is wanting to try in home care/support.   CSW will provide resources to him for in home private duty care for him to interview and consider. Pt works 2nd shift (2pm-12:30am) and hopes to get someone to help with her in the afternoon/early evening with toileting, meal prep, etc).  CSW will update as progress is made with husband's arrangements.   Eduard Clos, MSW, [place] Worker  [place] [phone]

## 2018-11-21 ENCOUNTER — Ambulatory Visit: Payer: Self-pay | Admitting: *Deleted

## 2018-11-21 ENCOUNTER — Other Ambulatory Visit: Payer: Self-pay | Admitting: Pharmacist

## 2018-11-21 NOTE — Progress Notes (Signed)
EPIC Encounter for ICM Monitoring  Patient Name: [name] is a 78 y.o. female Date: 11/21/2018 Primary Care Physican: [name], MD [facility] [place] Electrophysiologist: Santina Evans Pacing:93.4% Weight:unknown  Call Marena Chancy.Patient is asymptomatic for fluid symptoms. She is receiving PT at home to get her strength back after the hospitalizations.  3 hospitalizations since 07/2018 and ER visit 11/12/2018.[name]  Optivol thoracic impedance suggesting possible ongoing fluid accumulation since 10/17/2018 but is trending back to baseline normal.  Prescribed:Lasix was stopped per 09/19/2018 at time of hospital discharge.  Dr Harl Bowie reviewed (Dr [name] was out of office)  ICM note on 7/22 and advised not to start back on diuretic due to rising Creatinine.  Labs: 11/12/2018 Creatinine 1.89, BUN 22, Potassium 4.4, Sodium 152, GFR 25-29 10/31/2018 Creatinine 1.89, BUN 24, Potassium 3.0, Sodium 147, GFR 25-29 10/24/2018 Creatinine 1.25, BUN 14, Potassium 3.8, Sodium 141, GFR 41-47  10/20/2018 Creatinine 1.21, BUN 13, Potassium 3.3, Sodium 143, GFR 42-49  10/19/2018 Creatinine 1.28, BUN 12, Potassium 4.1, Sodium 140, GFR 39-46  10/17/2018 Creatinine 1.38, BUN 18, Potassium 4.0, Sodium 137, GFR 36-42  10/16/2018 Creatinine 1.54, BUN 21, Potassium 4.1, Sodium 137, GFR 32-37 10/14/2018 Creatinine 1.35, BUN 15, Potassium 4.1, Sodium 139, GFR 37-43  A complete set of results can be found in Results Review.   Recommendations:No changes and encouraged to call if experiencing any fluid symptoms.  Follow-up plan: ICM clinic phone appointment on8/01/2019 (manual send) to recheck fluid levels.   Copy of ICM check sent to Dr.Taylor and Dr [name].   3 month ICM trend: 11/18/2018    1 Year ICM trend:       [name], RN 11/21/2018 12:03 PM

## 2018-11-21 NOTE — Patient Outreach (Signed)
[facility]) Care Management  [place]  11/21/2018  MELISSA TOMASELLI 03/10/1941 [id number]   Reason for referral: Lovenox administration questions  Referral source: [facility] Utilization Management Department Current insurance: [facility]  Outreach:  Unsuccessful telephone call attempt #1 to patient's spouse and son.  HIPAA compliant voicemail left requesting a return call with son, no voicemail available on spouse's phone.    Plan:  -I will mail patient an unsuccessful outreach letter.  -I will make another outreach attempt to patient within 3-4 business days.    Ralene Bathe, PharmD, [place] [phone]

## 2018-11-22 DIAGNOSIS — J9600 Acute respiratory failure, unspecified whether with hypoxia or hypercapnia: Secondary | ICD-10-CM | POA: Diagnosis not present

## 2018-11-26 ENCOUNTER — Other Ambulatory Visit: Payer: Self-pay | Admitting: *Deleted

## 2018-11-26 ENCOUNTER — Other Ambulatory Visit: Payer: Self-pay | Admitting: Pharmacist

## 2018-11-26 ENCOUNTER — Ambulatory Visit: Payer: Self-pay | Admitting: *Deleted

## 2018-11-26 ENCOUNTER — Ambulatory Visit: Payer: Self-pay | Admitting: Pharmacist

## 2018-11-26 DIAGNOSIS — R945 Abnormal results of liver function studies: Secondary | ICD-10-CM | POA: Diagnosis not present

## 2018-11-26 DIAGNOSIS — N183 Chronic kidney disease, stage 3 (moderate): Secondary | ICD-10-CM | POA: Diagnosis not present

## 2018-11-26 DIAGNOSIS — J9601 Acute respiratory failure with hypoxia: Secondary | ICD-10-CM | POA: Diagnosis not present

## 2018-11-26 DIAGNOSIS — Z95810 Presence of automatic (implantable) cardiac defibrillator: Secondary | ICD-10-CM | POA: Diagnosis not present

## 2018-11-26 DIAGNOSIS — D638 Anemia in other chronic diseases classified elsewhere: Secondary | ICD-10-CM | POA: Diagnosis not present

## 2018-11-26 DIAGNOSIS — K913 Postprocedural intestinal obstruction, unspecified as to partial versus complete: Secondary | ICD-10-CM | POA: Diagnosis not present

## 2018-11-26 DIAGNOSIS — E46 Unspecified protein-calorie malnutrition: Secondary | ICD-10-CM | POA: Diagnosis not present

## 2018-11-26 DIAGNOSIS — K219 Gastro-esophageal reflux disease without esophagitis: Secondary | ICD-10-CM | POA: Diagnosis not present

## 2018-11-26 DIAGNOSIS — L89312 Pressure ulcer of right buttock, stage 2: Secondary | ICD-10-CM | POA: Diagnosis not present

## 2018-11-26 DIAGNOSIS — I509 Heart failure, unspecified: Secondary | ICD-10-CM | POA: Diagnosis not present

## 2018-11-26 DIAGNOSIS — Z853 Personal history of malignant neoplasm of breast: Secondary | ICD-10-CM | POA: Diagnosis not present

## 2018-11-26 DIAGNOSIS — M6281 Muscle weakness (generalized): Secondary | ICD-10-CM | POA: Diagnosis not present

## 2018-11-26 DIAGNOSIS — I129 Hypertensive chronic kidney disease with stage 1 through stage 4 chronic kidney disease, or unspecified chronic kidney disease: Secondary | ICD-10-CM | POA: Diagnosis not present

## 2018-11-26 DIAGNOSIS — J42 Unspecified chronic bronchitis: Secondary | ICD-10-CM | POA: Diagnosis not present

## 2018-11-26 DIAGNOSIS — E871 Hypo-osmolality and hyponatremia: Secondary | ICD-10-CM | POA: Diagnosis not present

## 2018-11-26 DIAGNOSIS — R1310 Dysphagia, unspecified: Secondary | ICD-10-CM | POA: Diagnosis not present

## 2018-11-26 DIAGNOSIS — Z760 Encounter for issue of repeat prescription: Secondary | ICD-10-CM | POA: Diagnosis not present

## 2018-11-26 DIAGNOSIS — J189 Pneumonia, unspecified organism: Secondary | ICD-10-CM | POA: Diagnosis not present

## 2018-11-26 DIAGNOSIS — Z4800 Encounter for change or removal of nonsurgical wound dressing: Secondary | ICD-10-CM | POA: Diagnosis not present

## 2018-11-26 NOTE — Patient Outreach (Addendum)
[facility]) Care Management  [place]   11/26/2018  [name] Dec 12, 1940 [id number]  Reason for referral: Lovenox administration questions  Referral source: [place] Utilization Management Department Current insurance: [facility]  PMHx significant for 3 hospitalizations since April 2020 and ER visit 11/12/2018, noted elevated SCr and referral to nephrology, HTN, CKD-III, GERD, CAD, chronic abdominal wound since exploratory laparotomy 4/'20, anemia, CHF, ICD, gout, HOH, RA,UTI   Outreach:  Successful telephone call with patient's spouse.  HIPAA identifiers verified.   Subjective:  Spouse states he assists patient with all her medications.  He organizes them in a pillbox. He is unable to review medications today however as he states he "does not have time."  He reports co-pay for Lovenox was $179 but was told it been reduced to $85, likely after a Tier Reduction approved. He reports though that he had to pay the $179 this week again because the co-pay had not been updated yet.  He is wanting to see if there is patient assistance for Lovenox.    Objective: The ASCVD Risk score Mikey Bussing DC Jr., et al., 2013) failed to calculate for the following reasons:   The 2013 ASCVD risk score is only valid for ages 32 to 68  Lab Results  Component Value Date   CREATININE 1.89 (H) 11/12/2018   CREATININE 1.89 (H) 10/31/2018   CREATININE 1.25 (H) 10/24/2018    Lab Results  Component Value Date   HGBA1C 5.6 09/20/2018    Lipid Panel     Component Value Date/Time   CHOL 134 05/23/2012 0905   TRIG 94 09/15/2018 0459   HDL 30 (L) 05/23/2012 0905   CHOLHDL 4.5 05/23/2012 0905   VLDL 21 05/23/2012 0905   LDLCALC 83 05/23/2012 0905    BP Readings from Last 3 Encounters:  11/12/18 107/64  10/31/18 (!) 119/52  09/19/18 (!) 139/58    Allergies  Allergen Reactions  . Macrobid [Nitrofurantoin Macrocrystal] Hives and Itching     Medications Reviewed Today    Reviewed by Lyndal Rainbow, CPhT (Pharmacy Technician) on 11/12/18 at [place]  Med List Status: Follow Up Phone Call  Medication Order Taking? Sig Documenting Provider Last Dose Status Informant  acetaminophen (TYLENOL) 325 MG tablet [id number]  Take 2 tablets (650 mg total) by mouth every 6 (six) hours as needed for mild pain (or Fever >/= 101). Cherylann Ratel A, DO  Active   acetaminophen (TYLENOL) 500 MG tablet [id number]  Take 500 mg by mouth every 6 (six) hours as needed (FOR PAIN/HEADACHES). [provider]  Active Multiple Informants           Med Note Trinidad Curet, DANA K   Fri Aug 09, 2017 11:11 PM)    acetaminophen (TYLENOL) 650 MG suppository [id number]  Place 1 suppository (650 mg total) rectally every 6 (six) hours as needed for mild pain (or Fever >/= 101). Cherylann Ratel A, DO  Active   albuterol (PROVENTIL) (2.5 MG/3ML) 0.083% nebulizer solution [id number]  Take 3 mLs (2.5 mg total) by nebulization every 4 (four) hours as needed for wheezing or shortness of breath. Cherylann Ratel A, DO  Active   Amino Acids-Protein Hydrolys (FEEDING SUPPLEMENT, PRO-STAT SUGAR FREE 64,) LIQD [id number]  Place 30 mLs into feeding tube 2 (two) times daily. Cherylann Ratel A, DO  Active   amiodarone (PACERONE) 200 MG tablet [id number]  Take 1 tablet (200 mg total) by mouth daily. Cherylann Ratel A, DO  Active  carvedilol (COREG) 3.125 MG tablet [id number]  Take 1 tablet (3.125 mg total) by mouth 2 (two) times daily with a meal. Marylyn Ishihara, Tyrone A, DO  Active   cephALEXin (KEFLEX) 500 MG capsule [id number]  Take 1 capsule (500 mg total) by mouth 4 (four) times daily. Milton Ferguson, MD  Active   chlorhexidine (PERIDEX) 0.12 % solution [id number]  15 mLs by Mouth Rinse route 2 (two) times daily. Cherylann Ratel A, DO  Active   enoxaparin (LOVENOX) 40 MG/0.4ML injection [id number]  Inject 40 mg into the skin daily. [provider]  Active   insulin aspart (NOVOLOG) 100 UNIT/ML injection  [id number]  Inject 0-20 Units into the skin every 4 (four) hours. Cherylann Ratel A, DO  Active   mirtazapine (REMERON) 15 MG tablet [id number]  Take 15 mg by mouth every evening. [provider]  Active   mouth rinse LIQD solution [id number]  15 mLs by Mouth Rinse route 2 times daily at 12 noon and 4 pm. Cherylann Ratel A, DO  Active   mupirocin ointment (BACTROBAN) 2 % [id number]  Place into the nose 2 (two) times daily. Cherylann Ratel A, DO  Active   Nutritional Supplements (FEEDING SUPPLEMENT, OSMOLITE 1.5 CAL,) LIQD [id number]  Place 1,000 mLs into feeding tube continuous. Cherylann Ratel A, DO  Active   ondansetron (ZOFRAN) 4 MG/2ML SOLN injection [id number]  Inject 2 mLs (4 mg total) into the vein every 6 (six) hours as needed for nausea. Cherylann Ratel A, DO  Active   ondansetron (ZOFRAN-ODT) 4 MG disintegrating tablet [id number]  Take 1 tablet (4 mg total) by mouth every 6 (six) hours as needed for nausea. Cherylann Ratel A, DO  Active   oxyCODONE (OXY IR/ROXICODONE) 5 MG immediate release tablet [id number]  Take 1-2 tablets (5-10 mg total) by mouth every 4 (four) hours as needed for moderate pain or severe pain (42m for moderate pain, 166mfor severe pain). KyCherylann Ratel, DO  Active   pantoprazole (PROTONIX) 40 MG tablet [id number] 1 tablet (40 mg total) by mouth daily. KyCherylann Ratel, DO  Active   potassium chloride SA (K-DUR) 20 MEQ tablet [id number] 1 tablet (20 mEq total) by mouth daily. ZaMilton FergusonMD  Active   predniSONE (DELTASONE) 20 MG tablet [id number] 2 tablets (40 mg total) by mouth daily with breakfast. KyCherylann Ratel, DO  Active   saccharomyces boulardii (FLORASTOR) 250 MG capsule [id number] 1 capsule (250 mg total) by mouth 2 (two) times daily. KyCherylann Ratel, DO  Active           Assessment:  Medication Review Findings:  . Unable to review medications telephonically with spouse today  Medication Assistance Findings:  Medication assistance needs identified:  Lovenox   Spouse reports patient receives ~ $6$71 month but he is still working.  He does not have Medicare yet.  He states annual income in 2019 ~$35000.  He states he is not working very much currently due to COVID-19 and income this year will be much less.   Extra Help:  Not eligible for Extra Help Low Income Subsidy based on reported income and assets  Patient Assistance Programs: Lovenox made by [employer] Income requirement met: Yes o Out-of-pocket prescription expenditure met:   Unknown - Patient has met application requirements to apply for this program.  - Reviewed program requirements with patient.   - [employer] has a 2% out-of-pocket requirement however may waive this for patient.  -  Noted elevated SCr 1.9, CrCl ~23 ml/min using wt 60.7kg.   Even if SCr improves to ~1.5 range, CrCl still borderline 30 ml/min.  Recommend dose reduction to Lovenox 68m/day due CrCl < 30 ml/min.   Plan: . I will route patient assistance letter to TBlandvilletechnician who will coordinate patient assistance program application process for medications listed above.  [facility] will assist with obtaining all required documents from both patient and provider(s) and submit application(s) once completed.   . Will route note to PCP also regarding dose reduction recommendation  CRalene Bathe PharmD, [phone]

## 2018-11-26 NOTE — Patient Outreach (Signed)
[place]) Care Management  11/26/2018  [name] 03-11-1941 [id number]   CSW attempted to make contact with pt's spouse and left a message for return call. CSW will attempt outreach again in 3-4 business days if no return call received.   Eduard Clos, MSW, [place] Worker  [place] [phone]

## 2018-12-01 ENCOUNTER — Other Ambulatory Visit: Payer: Self-pay | Admitting: Pharmacy Technician

## 2018-12-01 NOTE — Patient Outreach (Signed)
[place] [facility]) Care Management  12/01/2018  [name] [birth year] [id number]                          Medication Assistance Referral  Referral From: [place]  Medication/Company: Lovenox / [employer] Patient application portion:  Education officer, museum portion: Faxed  to Dr. Legrand Rams    Follow up:  Will follow up with patient in 7-10 business days to confirm application(s) have been received.  Maud Deed Chana Bode [place] Certified Pharmacy Technician [place] Management Direct Dial:[phone]

## 2018-12-02 ENCOUNTER — Ambulatory Visit: Payer: Self-pay | Admitting: *Deleted

## 2018-12-02 ENCOUNTER — Encounter: Payer: Self-pay | Admitting: *Deleted

## 2018-12-02 ENCOUNTER — Other Ambulatory Visit: Payer: Self-pay | Admitting: *Deleted

## 2018-12-02 DIAGNOSIS — J189 Pneumonia, unspecified organism: Secondary | ICD-10-CM | POA: Diagnosis not present

## 2018-12-02 DIAGNOSIS — I129 Hypertensive chronic kidney disease with stage 1 through stage 4 chronic kidney disease, or unspecified chronic kidney disease: Secondary | ICD-10-CM | POA: Diagnosis not present

## 2018-12-02 DIAGNOSIS — J42 Unspecified chronic bronchitis: Secondary | ICD-10-CM | POA: Diagnosis not present

## 2018-12-02 DIAGNOSIS — E871 Hypo-osmolality and hyponatremia: Secondary | ICD-10-CM | POA: Diagnosis not present

## 2018-12-02 DIAGNOSIS — R1310 Dysphagia, unspecified: Secondary | ICD-10-CM | POA: Diagnosis not present

## 2018-12-02 DIAGNOSIS — Z760 Encounter for issue of repeat prescription: Secondary | ICD-10-CM | POA: Diagnosis not present

## 2018-12-02 DIAGNOSIS — I509 Heart failure, unspecified: Secondary | ICD-10-CM | POA: Diagnosis not present

## 2018-12-02 DIAGNOSIS — R945 Abnormal results of liver function studies: Secondary | ICD-10-CM | POA: Diagnosis not present

## 2018-12-02 DIAGNOSIS — J9601 Acute respiratory failure with hypoxia: Secondary | ICD-10-CM | POA: Diagnosis not present

## 2018-12-02 DIAGNOSIS — Z95810 Presence of automatic (implantable) cardiac defibrillator: Secondary | ICD-10-CM | POA: Diagnosis not present

## 2018-12-02 DIAGNOSIS — E46 Unspecified protein-calorie malnutrition: Secondary | ICD-10-CM | POA: Diagnosis not present

## 2018-12-02 DIAGNOSIS — M6281 Muscle weakness (generalized): Secondary | ICD-10-CM | POA: Diagnosis not present

## 2018-12-02 DIAGNOSIS — Z4800 Encounter for change or removal of nonsurgical wound dressing: Secondary | ICD-10-CM | POA: Diagnosis not present

## 2018-12-02 DIAGNOSIS — K219 Gastro-esophageal reflux disease without esophagitis: Secondary | ICD-10-CM | POA: Diagnosis not present

## 2018-12-02 DIAGNOSIS — Z853 Personal history of malignant neoplasm of breast: Secondary | ICD-10-CM | POA: Diagnosis not present

## 2018-12-02 DIAGNOSIS — L89312 Pressure ulcer of right buttock, stage 2: Secondary | ICD-10-CM | POA: Diagnosis not present

## 2018-12-02 DIAGNOSIS — D638 Anemia in other chronic diseases classified elsewhere: Secondary | ICD-10-CM | POA: Diagnosis not present

## 2018-12-02 DIAGNOSIS — K913 Postprocedural intestinal obstruction, unspecified as to partial versus complete: Secondary | ICD-10-CM | POA: Diagnosis not present

## 2018-12-02 DIAGNOSIS — N183 Chronic kidney disease, stage 3 (moderate): Secondary | ICD-10-CM | POA: Diagnosis not present

## 2018-12-02 NOTE — Patient Outreach (Signed)
[place] [facility]) Care Management  12/02/2018  ZANYIA SILBAUGH 1941/02/11 [id number]   CSW attempted to reach pt's spouse today by phone and was unsuccessful. CSW will mail an unsuccessful outreach letter and try again per policy (3 calls within 10 business day).  Eduard Clos, MSW, [place] Worker  [place] [phone]

## 2018-12-05 NOTE — Progress Notes (Signed)
No ICM remote transmission received for 12/01/2018 and next ICM transmission scheduled for 01/01/2019.

## 2018-12-08 DIAGNOSIS — R945 Abnormal results of liver function studies: Secondary | ICD-10-CM | POA: Diagnosis not present

## 2018-12-08 DIAGNOSIS — M6281 Muscle weakness (generalized): Secondary | ICD-10-CM | POA: Diagnosis not present

## 2018-12-08 DIAGNOSIS — I509 Heart failure, unspecified: Secondary | ICD-10-CM | POA: Diagnosis not present

## 2018-12-08 DIAGNOSIS — L89312 Pressure ulcer of right buttock, stage 2: Secondary | ICD-10-CM | POA: Diagnosis not present

## 2018-12-08 DIAGNOSIS — K913 Postprocedural intestinal obstruction, unspecified as to partial versus complete: Secondary | ICD-10-CM | POA: Diagnosis not present

## 2018-12-08 DIAGNOSIS — J189 Pneumonia, unspecified organism: Secondary | ICD-10-CM | POA: Diagnosis not present

## 2018-12-08 DIAGNOSIS — Z4800 Encounter for change or removal of nonsurgical wound dressing: Secondary | ICD-10-CM | POA: Diagnosis not present

## 2018-12-08 DIAGNOSIS — R1310 Dysphagia, unspecified: Secondary | ICD-10-CM | POA: Diagnosis not present

## 2018-12-08 DIAGNOSIS — J42 Unspecified chronic bronchitis: Secondary | ICD-10-CM | POA: Diagnosis not present

## 2018-12-08 DIAGNOSIS — Z95810 Presence of automatic (implantable) cardiac defibrillator: Secondary | ICD-10-CM | POA: Diagnosis not present

## 2018-12-08 DIAGNOSIS — D638 Anemia in other chronic diseases classified elsewhere: Secondary | ICD-10-CM | POA: Diagnosis not present

## 2018-12-08 DIAGNOSIS — E46 Unspecified protein-calorie malnutrition: Secondary | ICD-10-CM | POA: Diagnosis not present

## 2018-12-08 DIAGNOSIS — N183 Chronic kidney disease, stage 3 (moderate): Secondary | ICD-10-CM | POA: Diagnosis not present

## 2018-12-08 DIAGNOSIS — Z853 Personal history of malignant neoplasm of breast: Secondary | ICD-10-CM | POA: Diagnosis not present

## 2018-12-08 DIAGNOSIS — K219 Gastro-esophageal reflux disease without esophagitis: Secondary | ICD-10-CM | POA: Diagnosis not present

## 2018-12-08 DIAGNOSIS — J9601 Acute respiratory failure with hypoxia: Secondary | ICD-10-CM | POA: Diagnosis not present

## 2018-12-08 DIAGNOSIS — E871 Hypo-osmolality and hyponatremia: Secondary | ICD-10-CM | POA: Diagnosis not present

## 2018-12-08 DIAGNOSIS — Z760 Encounter for issue of repeat prescription: Secondary | ICD-10-CM | POA: Diagnosis not present

## 2018-12-08 DIAGNOSIS — I129 Hypertensive chronic kidney disease with stage 1 through stage 4 chronic kidney disease, or unspecified chronic kidney disease: Secondary | ICD-10-CM | POA: Diagnosis not present

## 2018-12-10 ENCOUNTER — Ambulatory Visit: Payer: Self-pay | Admitting: *Deleted

## 2018-12-10 ENCOUNTER — Other Ambulatory Visit: Payer: Self-pay | Admitting: *Deleted

## 2018-12-10 ENCOUNTER — Emergency Department (HOSPITAL_COMMUNITY): Payer: Medicare Other

## 2018-12-10 ENCOUNTER — Inpatient Hospital Stay (HOSPITAL_COMMUNITY)
Admission: EM | Admit: 2018-12-10 | Discharge: 2019-01-01 | DRG: 394 | Disposition: A | Discharge status: Hospice | Payer: Medicare Other | Attending: Internal Medicine | Admitting: Internal Medicine

## 2018-12-10 ENCOUNTER — Encounter (HOSPITAL_COMMUNITY): Payer: Self-pay

## 2018-12-10 DIAGNOSIS — D638 Anemia in other chronic diseases classified elsewhere: Secondary | ICD-10-CM | POA: Diagnosis present

## 2018-12-10 DIAGNOSIS — S51801A Unspecified open wound of right forearm, initial encounter: Secondary | ICD-10-CM | POA: Diagnosis not present

## 2018-12-10 DIAGNOSIS — I5022 Chronic systolic (congestive) heart failure: Secondary | ICD-10-CM | POA: Diagnosis present

## 2018-12-10 DIAGNOSIS — D649 Anemia, unspecified: Secondary | ICD-10-CM | POA: Diagnosis not present

## 2018-12-10 DIAGNOSIS — Z853 Personal history of malignant neoplasm of breast: Secondary | ICD-10-CM

## 2018-12-10 DIAGNOSIS — L89150 Pressure ulcer of sacral region, unstageable: Secondary | ICD-10-CM | POA: Diagnosis present

## 2018-12-10 DIAGNOSIS — Z9012 Acquired absence of left breast and nipple: Secondary | ICD-10-CM

## 2018-12-10 DIAGNOSIS — Z794 Long term (current) use of insulin: Secondary | ICD-10-CM

## 2018-12-10 DIAGNOSIS — N183 Chronic kidney disease, stage 3 (moderate): Secondary | ICD-10-CM | POA: Diagnosis not present

## 2018-12-10 DIAGNOSIS — Z515 Encounter for palliative care: Secondary | ICD-10-CM

## 2018-12-10 DIAGNOSIS — I130 Hypertensive heart and chronic kidney disease with heart failure and stage 1 through stage 4 chronic kidney disease, or unspecified chronic kidney disease: Secondary | ICD-10-CM | POA: Diagnosis not present

## 2018-12-10 DIAGNOSIS — J449 Chronic obstructive pulmonary disease, unspecified: Secondary | ICD-10-CM | POA: Diagnosis present

## 2018-12-10 DIAGNOSIS — R64 Cachexia: Secondary | ICD-10-CM | POA: Diagnosis not present

## 2018-12-10 DIAGNOSIS — E1122 Type 2 diabetes mellitus with diabetic chronic kidney disease: Secondary | ICD-10-CM | POA: Diagnosis not present

## 2018-12-10 DIAGNOSIS — M069 Rheumatoid arthritis, unspecified: Secondary | ICD-10-CM | POA: Diagnosis not present

## 2018-12-10 DIAGNOSIS — E876 Hypokalemia: Secondary | ICD-10-CM | POA: Diagnosis present

## 2018-12-10 DIAGNOSIS — Z66 Do not resuscitate: Secondary | ICD-10-CM | POA: Diagnosis present

## 2018-12-10 DIAGNOSIS — E7800 Pure hypercholesterolemia, unspecified: Secondary | ICD-10-CM | POA: Diagnosis not present

## 2018-12-10 DIAGNOSIS — L89312 Pressure ulcer of right buttock, stage 2: Secondary | ICD-10-CM | POA: Diagnosis not present

## 2018-12-10 DIAGNOSIS — Z833 Family history of diabetes mellitus: Secondary | ICD-10-CM

## 2018-12-10 DIAGNOSIS — Z452 Encounter for adjustment and management of vascular access device: Secondary | ICD-10-CM

## 2018-12-10 DIAGNOSIS — E11649 Type 2 diabetes mellitus with hypoglycemia without coma: Secondary | ICD-10-CM | POA: Diagnosis not present

## 2018-12-10 DIAGNOSIS — E870 Hyperosmolality and hypernatremia: Secondary | ICD-10-CM | POA: Diagnosis present

## 2018-12-10 DIAGNOSIS — Z7189 Other specified counseling: Secondary | ICD-10-CM

## 2018-12-10 DIAGNOSIS — E46 Unspecified protein-calorie malnutrition: Secondary | ICD-10-CM

## 2018-12-10 DIAGNOSIS — H9190 Unspecified hearing loss, unspecified ear: Secondary | ICD-10-CM | POA: Diagnosis not present

## 2018-12-10 DIAGNOSIS — N179 Acute kidney failure, unspecified: Secondary | ICD-10-CM | POA: Diagnosis not present

## 2018-12-10 DIAGNOSIS — D696 Thrombocytopenia, unspecified: Secondary | ICD-10-CM | POA: Diagnosis not present

## 2018-12-10 DIAGNOSIS — R1084 Generalized abdominal pain: Secondary | ICD-10-CM | POA: Diagnosis not present

## 2018-12-10 DIAGNOSIS — R627 Adult failure to thrive: Secondary | ICD-10-CM | POA: Diagnosis not present

## 2018-12-10 DIAGNOSIS — Z22322 Carrier or suspected carrier of Methicillin resistant Staphylococcus aureus: Secondary | ICD-10-CM

## 2018-12-10 DIAGNOSIS — R5381 Other malaise: Secondary | ICD-10-CM | POA: Diagnosis not present

## 2018-12-10 DIAGNOSIS — K219 Gastro-esophageal reflux disease without esophagitis: Secondary | ICD-10-CM | POA: Diagnosis not present

## 2018-12-10 DIAGNOSIS — Z9049 Acquired absence of other specified parts of digestive tract: Secondary | ICD-10-CM

## 2018-12-10 DIAGNOSIS — K8020 Calculus of gallbladder without cholecystitis without obstruction: Secondary | ICD-10-CM | POA: Diagnosis not present

## 2018-12-10 DIAGNOSIS — Z22321 Carrier or suspected carrier of Methicillin susceptible Staphylococcus aureus: Secondary | ICD-10-CM

## 2018-12-10 DIAGNOSIS — K819 Cholecystitis, unspecified: Secondary | ICD-10-CM | POA: Diagnosis not present

## 2018-12-10 DIAGNOSIS — E785 Hyperlipidemia, unspecified: Secondary | ICD-10-CM | POA: Diagnosis not present

## 2018-12-10 DIAGNOSIS — Z720 Tobacco use: Secondary | ICD-10-CM

## 2018-12-10 DIAGNOSIS — S31102A Unspecified open wound of abdominal wall, epigastric region without penetration into peritoneal cavity, initial encounter: Secondary | ICD-10-CM | POA: Diagnosis not present

## 2018-12-10 DIAGNOSIS — J9600 Acute respiratory failure, unspecified whether with hypoxia or hypercapnia: Secondary | ICD-10-CM | POA: Diagnosis not present

## 2018-12-10 DIAGNOSIS — Z7952 Long term (current) use of systemic steroids: Secondary | ICD-10-CM

## 2018-12-10 DIAGNOSIS — Z8249 Family history of ischemic heart disease and other diseases of the circulatory system: Secondary | ICD-10-CM

## 2018-12-10 DIAGNOSIS — Z20828 Contact with and (suspected) exposure to other viral communicable diseases: Secondary | ICD-10-CM | POA: Diagnosis present

## 2018-12-10 DIAGNOSIS — I2510 Atherosclerotic heart disease of native coronary artery without angina pectoris: Secondary | ICD-10-CM | POA: Diagnosis present

## 2018-12-10 DIAGNOSIS — I255 Ischemic cardiomyopathy: Secondary | ICD-10-CM | POA: Diagnosis not present

## 2018-12-10 DIAGNOSIS — M109 Gout, unspecified: Secondary | ICD-10-CM | POA: Diagnosis present

## 2018-12-10 DIAGNOSIS — K632 Fistula of intestine: Secondary | ICD-10-CM

## 2018-12-10 DIAGNOSIS — Z888 Allergy status to other drugs, medicaments and biological substances status: Secondary | ICD-10-CM

## 2018-12-10 DIAGNOSIS — Z03818 Encounter for observation for suspected exposure to other biological agents ruled out: Secondary | ICD-10-CM | POA: Diagnosis not present

## 2018-12-10 DIAGNOSIS — Z95810 Presence of automatic (implantable) cardiac defibrillator: Secondary | ICD-10-CM

## 2018-12-10 DIAGNOSIS — Z96651 Presence of right artificial knee joint: Secondary | ICD-10-CM | POA: Diagnosis present

## 2018-12-10 DIAGNOSIS — Z6826 Body mass index (BMI) 26.0-26.9, adult: Secondary | ICD-10-CM

## 2018-12-10 DIAGNOSIS — E43 Unspecified severe protein-calorie malnutrition: Secondary | ICD-10-CM | POA: Diagnosis not present

## 2018-12-10 DIAGNOSIS — Z79899 Other long term (current) drug therapy: Secondary | ICD-10-CM

## 2018-12-10 LAB — LACTIC ACID, PLASMA: Lactic Acid, Venous: 1.9 mmol/L (ref 0.5–1.9)

## 2018-12-10 LAB — CBC WITH DIFFERENTIAL/PLATELET
Abs Immature Granulocytes: 0.06 10*3/uL (ref 0.00–0.07)
Basophils Absolute: 0 10*3/uL (ref 0.0–0.1)
Basophils Relative: 0 %
Eosinophils Absolute: 0 10*3/uL (ref 0.0–0.5)
Eosinophils Relative: 0 %
HCT: 36.9 % (ref 36.0–46.0)
Hemoglobin: 10.6 g/dL — ABNORMAL LOW (ref 12.0–15.0)
Immature Granulocytes: 1 %
Lymphocytes Relative: 36 %
Lymphs Abs: 2.1 10*3/uL (ref 0.7–4.0)
MCH: 24.8 pg — ABNORMAL LOW (ref 26.0–34.0)
MCHC: 28.7 g/dL — ABNORMAL LOW (ref 30.0–36.0)
MCV: 86.4 fL (ref 80.0–100.0)
Monocytes Absolute: 0.3 10*3/uL (ref 0.1–1.0)
Monocytes Relative: 5 %
Neutro Abs: 3.5 10*3/uL (ref 1.7–7.7)
Neutrophils Relative %: 58 %
Platelets: 78 10*3/uL — ABNORMAL LOW (ref 150–400)
RBC: 4.27 MIL/uL (ref 3.87–5.11)
RDW: 19.9 % — ABNORMAL HIGH (ref 11.5–15.5)
WBC: 6 10*3/uL (ref 4.0–10.5)
nRBC: 1.3 % — ABNORMAL HIGH (ref 0.0–0.2)

## 2018-12-10 LAB — COMPREHENSIVE METABOLIC PANEL
ALT: 18 U/L (ref 0–44)
AST: 47 U/L — ABNORMAL HIGH (ref 15–41)
Albumin: 1.6 g/dL — ABNORMAL LOW (ref 3.5–5.0)
Alkaline Phosphatase: 397 U/L — ABNORMAL HIGH (ref 38–126)
Anion gap: 11 (ref 5–15)
BUN: 46 mg/dL — ABNORMAL HIGH (ref 8–23)
CO2: 18 mmol/L — ABNORMAL LOW (ref 22–32)
Calcium: 7.7 mg/dL — ABNORMAL LOW (ref 8.9–10.3)
Chloride: 127 mmol/L — ABNORMAL HIGH (ref 98–111)
Creatinine, Ser: 2.9 mg/dL — ABNORMAL HIGH (ref 0.44–1.00)
GFR calc Af Amer: 17 mL/min — ABNORMAL LOW (ref >60)
GFR calc non Af Amer: 15 mL/min — ABNORMAL LOW (ref >60)
Glucose, Bld: 93 mg/dL (ref 70–99)
Potassium: 3.7 mmol/L (ref 3.5–5.1)
Sodium: 156 mmol/L — ABNORMAL HIGH (ref 135–145)
Total Bilirubin: 1.4 mg/dL — ABNORMAL HIGH (ref 0.3–1.2)
Total Protein: 6.2 g/dL — ABNORMAL LOW (ref 6.5–8.1)

## 2018-12-10 LAB — SARS CORONAVIRUS 2 BY RT PCR (HOSPITAL ORDER, PERFORMED IN ~~LOC~~ HOSPITAL LAB): SARS Coronavirus 2: NEGATIVE

## 2018-12-10 LAB — LIPASE, BLOOD: Lipase: 45 U/L (ref 11–51)

## 2018-12-10 MED ORDER — LACTATED RINGERS IV SOLN
INTRAVENOUS | Status: DC
  Administered 2018-12-10: 22:00:00 via INTRAVENOUS

## 2018-12-10 MED ORDER — PIPERACILLIN-TAZOBACTAM 3.375 G IVPB: 3.3750 g | Freq: Two times a day (BID) | INTRAVENOUS | Status: DC

## 2018-12-10 MED ORDER — PIPERACILLIN-TAZOBACTAM 3.375 G IVPB 30 MIN
3.3750 g | Freq: Once | INTRAVENOUS | Status: AC
  Administered 2018-12-10: 3.375 g via INTRAVENOUS
  Filled 2018-12-10: qty 50

## 2018-12-10 NOTE — ED Notes (Signed)
Patient is very hard of hearing. Patient is alert but a little confused at times.

## 2018-12-10 NOTE — ED Notes (Addendum)
Patient incontinent of urine at this time. Patient given peri care, changed into hospital gown and placed a brief on patient. Patient repositioned at this time also. Placed patient on cardiac monitor.

## 2018-12-10 NOTE — ED Triage Notes (Signed)
EMS reports pt had abd surgery a couple of months ago.  Family told ems they thought wound was infected and thinks feces may be coming from the wound.  Denies any fever.  Pt c/o abd pain.  Denies n/v.

## 2018-12-10 NOTE — ED Notes (Signed)
Report given to [facility]. 

## 2018-12-10 NOTE — Patient Outreach (Signed)
[place] [facility]) Care Management  12/10/2018  YEVETTE KNUST 04/13/1941 [id number]   CSW has attempted to reach pt/spouse 3 times in the past 10 business days without success and without return call(s). CSW will close referral at this time per policy/protocol and advise PCP and [facility] team.   Eduard Clos, MSW, [place] Worker  [place] [phone]

## 2018-12-10 NOTE — Progress Notes (Signed)
Pharmacy Antibiotic Note  [name] is a 78 y.o. female admitted on 12/10/2018 with intra-abdominal infection.  Pharmacy has been consulted for zosyn dosing.  Plan:  Zosyn 3.375g IV q12h (4 hour infusion). F/u cxs and clinical progress Monitor V/S, labs  Height: 5\' 3"  (160 cm) Weight: 148 lb (67.1 kg) IBW/kg (Calculated) : 52.4  Temp (24hrs), Avg:97.6 F (36.4 C), Min:97.6 F (36.4 C), Max:97.6 F (36.4 C)  Recent Labs  Lab 12/10/18 1722  WBC 6.0  CREATININE 2.90*  LATICACIDVEN 1.9    Estimated Creatinine Clearance: 14.2 mL/min (A) (by C-G formula based on SCr of 2.9 mg/dL (H)).    Allergies  Allergen Reactions  . Macrobid [Nitrofurantoin Macrocrystal] Hives and Itching    Antimicrobials this admission: Zosyn 8/19 >>   Microbiology results:  BCx:   Thank you for allowing pharmacy to be a part of this patient's care. Isac Sarna, BS [place], [place] Clinical Pharmacist Pager [phone] 12/10/2018 10:15 PM

## 2018-12-10 NOTE — H&P (Addendum)
TRH H&P    Patient Demographics:    [name], is a 78 y.o. female  MRN: [id number]  DOB - [birth year]  Admit Date - 12/10/2018  Referring MD/NP/PA: Nanda Quinton  Outpatient Primary MD for the patient is [name], MD  Patient coming from: Home  Chief complaint-wound infection   HPI:    [name]  is a 78 y.o. female, with history of anemia of chronic disease, ASCVD, AICD placement, history of breast cancer, chronic bronchitis, stage III CKD, systolic heart failure, hyperlipidemia, GERD, gout, hypertension, rheumatoid arthritis who recently had expiratory laparotomy with lysis of adhesions, bowel resection for ischemic bowel in May 2020.  She had large abdominal wound, which is healing by secondary intention.  As per family they noted feces coming from the wound.  No abdominal pain or fever.  Patient was brought to the hospital for further evaluation.  Patient is a poor historian.  History obtained from ED records.  CT of the abdomen pelvis was done, no results are available in epic due to technical issues.  ED physician spoke to radiologist verbally about the results.    As per ED physician, there is an enterocutaneous fistula and inflammation around the gallbladder.  No acute cholecystitis.  General surgery, Dr. Rosendo Gros was consulted, he recommended no immediate surgery at this time.  Recommended patient to transfer to [facility] and surgery will see patient in a.m.  In the ED lab work showed Sodium 156 Creatinine 2.90 BUN 46 Alk phos 397 Albumin 1.6 Lactic acid 1.9  She denies chest pain or shortness of breath. Denies nausea vomiting or diarrhea. Denies abdominal pain Denies coughing up any phlegm. Denies dysuria.   Review of systems:    In addition to the HPI above,   All other systems reviewed and are negative.    Past History of the following :    Past Medical History:  Diagnosis  Date  . Anemia    Hgb of 9-10  . Anemia of chronic disease    Hgb of 9-10 chronically; 06/2010: H&H-10.7/33.5, MCV-81, normal iron studies in 2010   . Arteriosclerotic cardiovascular disease (ASCVD)    Remote PTCA by patient report; LBBB; associated cardiomyopathy, presumed ischemic with EF 40-45% previously, 20% in 06/2009; h/o clinical congestive heart failure; negative stress nuclear in 2009 with inferoseptal and apical scar  . Automatic implantable cardioverter-defibrillator in situ   . Breast cancer ([place])    breast  . Chronic bronchitis ([place]   . Chronic renal disease, stage 3, moderately decreased glomerular filtration rate (GFR) between 30-59 mL/min/1.73 square meter (HCC) 02/01/2016  . Congestive heart failure (CHF) ([place])   . Elevated cholesterol   . Elevated sed rate   . GERD (gastroesophageal reflux disease)   . GI bleed   . Gout   . HOH (hard of hearing)   . Hyperlipidemia   . Hypertension   . Rheumatoid arthritis ([place])   . UTI (urinary tract infection)       Past Surgical History:  Procedure Laterality Date  . BI-VENTRICULAR IMPLANTABLE  CARDIOVERTER DEFIBRILLATOR N/A 07/28/2012   Procedure: BI-VENTRICULAR IMPLANTABLE CARDIOVERTER DEFIBRILLATOR  (CRT-D);  Surgeon: Evans Lance, MD;  Location: [facility] CATH LAB;  Service: Cardiovascular;  Laterality: N/A;  . BI-VENTRICULAR IMPLANTABLE CARDIOVERTER DEFIBRILLATOR  (CRT-D)  07/28/2012  . BOWEL RESECTION N/A 08/21/2018   Procedure: Small Bowel Resection;  Surgeon: Jovita Kussmaul, MD;  Location: [place];  Service: General;  Laterality: N/A;  . BREAST BIOPSY Bilateral   . CATARACT EXTRACTION W/ INTRAOCULAR LENS IMPLANT Left   . CATARACT EXTRACTION W/PHACO Right 09/15/2013   Procedure: CATARACT EXTRACTION PHACO AND INTRAOCULAR LENS PLACEMENT (IOC);  Surgeon: Elta Guadeloupe T. Gershon Crane, MD;  Location: [facility];  Service: Ophthalmology;  Laterality: Right;  CDE:  10.74  . COLONOSCOPY N/A 03/04/2015   Procedure: COLONOSCOPY;  Surgeon: Rogene Houston, MD;   Location[facility] ENDO SU[facility];  Service: Endoscopy;  Laterality: N/A;  10:50   . ESOPHAGOGASTRODUODENOSCOPY N/A 03/04/2015   Procedure: ESOPHAGOGASTRODUODENOSCOPY (EGD);  Surgeon: Rogene Houston, MD;  [facility];  Service: Endoscopy;  Laterality: N/A;  . ESOPHAGOGASTRODUODENOSCOPY N/A 09/21/2016   Procedure: ESOPHAGOGASTRODUODENOSCOPY (EGD);  Surgeon: Rogene Houston, MD;  [facility];  Service: Endoscopy;  Laterality: N/A;  . GIVENS CAPSULE STUDY N/A 09/22/2016   Procedure: GIVENS CAPSULE STUDY;  Surgeon: Rogene Houston, MD;  [facility];  Service: Endoscopy;  Laterality: N/A;  . LAPAROTOMY N/A 08/21/2018   Procedure: EXPLORATORY LAPAROTOMY WITH LYSIS OF ADHESIONS;  Surgeon: Jovita Kussmaul, MD;  Location: [place];  Service: General;  Laterality: N/A;  . MASTECTOMY Left 1998  . TENDON TRANSFER Right 08/15/2017   Procedure: TENDON TRANSFER RIGHT WRIST EXTENSORS AS NEEDED, ULNA RESECTION AND STABILIZATION;  Surgeon: Charlotte Crumb, MD;  Location: [place];  Service: Orthopedics;  Laterality: Right;  . TOTAL KNEE ARTHROPLASTY Right    Dr.Harrison  . TUBAL LIGATION        Social History:      Social History   Tobacco Use  . Smoking status: Former Smoker    Packs/day: 0.25    Years: 30.00    Pack years: 7.50    Types: Cigarettes    Start date: 09/17/1956    Quit date: 04/24/2007    Years since quitting: 11.6  . Smokeless tobacco: Current User    Types: Chew  . Tobacco comment: 07/03/2013 "smoked some; don't know how much or how long or when I quit"  Substance Use Topics  . Alcohol use: No    Alcohol/week: 0.0 standard drinks       Family History :     Family History  Problem Relation Age of Onset  . Diabetes Father   . Pancreatic cancer Father   . Hypertension Brother       Home Medications:   Prior to Admission medications   Medication Sig Start Date End Date Taking? Authorizing Provider  Amino Acids-Protein Hydrolys (FEEDING SUPPLEMENT,  PRO-STAT SUGAR FREE 64,) LIQD Place 30 mLs into feeding tube 2 (two) times daily. 09/19/18  Yes Kyle, Tyrone A, DO  amiodarone (PACERONE) 200 MG tablet Take 1 tablet (200 mg total) by mouth daily. 09/20/18  Yes Kyle, Tyrone A, DO  carvedilol (COREG) 3.125 MG tablet Take 1 tablet (3.125 mg total) by mouth 2 (two) times daily with a meal. 09/19/18  Yes Kyle, Tyrone A, DO  enoxaparin (LOVENOX) 40 MG/0.4ML injection Inject 40 mg into the skin daily. 10/28/18  Yes [provider]  pantoprazole (PROTONIX) 40 MG tablet Take 1 tablet (40  mg total) by mouth daily. 09/20/18  Yes Kyle, Tyrone A, DO  acetaminophen (TYLENOL) 325 MG tablet Take 2 tablets (650 mg total) by mouth every 6 (six) hours as needed for mild pain (or Fever >/= 101). 09/19/18   Cherylann Ratel A, DO  acetaminophen (TYLENOL) 500 MG tablet Take 500 mg by mouth every 6 (six) hours as needed (FOR PAIN/HEADACHES).    [provider]  acetaminophen (TYLENOL) 650 MG suppository Place 1 suppository (650 mg total) rectally every 6 (six) hours as needed for mild pain (or Fever >/= 101). 09/19/18   Cherylann Ratel A, DO  albuterol (PROVENTIL) (2.5 MG/3ML) 0.083% nebulizer solution Take 3 mLs (2.5 mg total) by nebulization every 4 (four) hours as needed for wheezing or shortness of breath. 09/19/18   Cherylann Ratel A, DO  cephALEXin (KEFLEX) 500 MG capsule Take 1 capsule (500 mg total) by mouth 2 (two) times daily. 11/12/18   Law, Bea Graff, PA-C  chlorhexidine (PERIDEX) 0.12 % solution 15 mLs by Mouth Rinse route 2 (two) times daily. 09/19/18   Cherylann Ratel A, DO  insulin aspart (NOVOLOG) 100 UNIT/ML injection Inject 0-20 Units into the skin every 4 (four) hours. 09/19/18   Cherylann Ratel A, DO  mirtazapine (REMERON) 15 MG tablet Take 15 mg by mouth every evening. 10/27/18   [provider]  mouth rinse LIQD solution 15 mLs by Mouth Rinse route 2 times daily at 12 noon and 4 pm. 09/19/18   Cherylann Ratel A, DO  mupirocin ointment (BACTROBAN) 2 % Place  into the nose 2 (two) times daily. 09/19/18   Cherylann Ratel A, DO  Nutritional Supplements (FEEDING SUPPLEMENT, OSMOLITE 1.5 CAL,) LIQD Place 1,000 mLs into feeding tube continuous. 09/19/18   Cherylann Ratel A, DO  ondansetron (ZOFRAN) 4 MG/2ML SOLN injection Inject 2 mLs (4 mg total) into the vein every 6 (six) hours as needed for nausea. 09/19/18   Cherylann Ratel A, DO  ondansetron (ZOFRAN-ODT) 4 MG disintegrating tablet Take 1 tablet (4 mg total) by mouth every 6 (six) hours as needed for nausea. 09/19/18   Cherylann Ratel A, DO  oxyCODONE (OXY IR/ROXICODONE) 5 MG immediate release tablet Take 1-2 tablets (5-10 mg total) by mouth every 4 (four) hours as needed for moderate pain or severe pain (4m for moderate pain, 122mfor severe pain). 09/19/18   KyCherylann Ratel, DO  potassium chloride SA (K-DUR) 20 MEQ tablet Take 1 tablet (20 mEq total) by mouth daily. 10/31/18   ZaMilton FergusonMD  predniSONE (DELTASONE) 20 MG tablet Take 2 tablets (40 mg total) by mouth daily with breakfast. 09/20/18   KyCherylann Ratel, DO  saccharomyces boulardii (FLORASTOR) 250 MG capsule Take 1 capsule (250 mg total) by mouth 2 (two) times daily. 09/19/18   KyCherylann Ratel, DO     Allergies:     Allergies  Allergen Reactions  . Macrobid [Nitrofurantoin Macrocrystal] Hives and Itching     Physical Exam:   Vitals  Blood pressure (!) 141/67, pulse 96, temperature 97.6 F (36.4 C), temperature source Oral, resp. rate 20, SpO2 97 %.  1.  General: Appears in no acute distress  2. Psychiatric: Alert, oriented x3, intact insight and judgment, limited communication due to hearing impairment  3. Neurologic: Cranial nerves II through XII grossly intact, motor strength 5/5 in all extremities  4. HEENMT:  Atraumatic normocephalic, extraocular muscles are intact  5. Respiratory : Clear to auscultation bilaterally, no wheezing or crackles  6. Cardiovascular : S1-S2, regular,  no murmur auscultated  7. Gastrointestinal:  Abdomen  is soft, nontender, no organomegaly  8. Skin:  Large open abdominal wound noted, good granulation tissue, no discharge noted.  No erythema noted.      Data Review:    CBC Recent Labs  Lab 12/10/18 1722  WBC 6.0  HGB 10.6*  HCT 36.9  PLT 78*  MCV 86.4  MCH 24.8*  MCHC 28.7*  RDW 19.9*  LYMPHSABS 2.1  MONOABS 0.3  EOSABS 0.0  BASOSABS 0.0   ------------------------------------------------------------------------------------------------------------------  Results for orders placed or performed during the hospital encounter of 12/10/18 (from the past 48 hour(s))  Comprehensive metabolic panel     Status: Abnormal   Collection Time: 12/10/18  5:22 PM  Result Value Ref Range   Sodium 156 (H) 135 - 145 mmol/L   Potassium 3.7 3.5 - 5.1 mmol/L   Chloride 127 (H) 98 - 111 mmol/L   CO2 18 (L) 22 - 32 mmol/L   Glucose, Bld 93 70 - 99 mg/dL   BUN 46 (H) 8 - 23 mg/dL   Creatinine, Ser 2.90 (H) 0.44 - 1.00 mg/dL   Calcium 7.7 (L) 8.9 - 10.3 mg/dL   Total Protein 6.2 (L) 6.5 - 8.1 g/dL   Albumin 1.6 (L) 3.5 - 5.0 g/dL   AST 47 (H) 15 - 41 U/L   ALT 18 0 - 44 U/L   Alkaline Phosphatase 397 (H) 38 - 126 U/L   Total Bilirubin 1.4 (H) 0.3 - 1.2 mg/dL   GFR calc non Af Amer 15 (L) >60 mL/min   GFR calc Af Amer 17 (L) >60 mL/min   Anion gap 11 5 - 15    Comment: Performed at [facility], [address] [place], [place] [address]  Lipase, blood     Status: None   Collection Time: 12/10/18  5:22 PM  Result Value Ref Range   Lipase 45 11 - 51 U/L    Comment: Performed at [facility], [address], [place], [place] [address]  CBC with Differential     Status: Abnormal   Collection Time: 12/10/18  5:22 PM  Result Value Ref Range   WBC 6.0 4.0 - 10.5 K/uL   RBC 4.27 3.87 - 5.11 MIL/uL   Hemoglobin 10.6 (L) 12.0 - 15.0 g/dL   HCT 36.9 36.0 - 46.0 %   MCV 86.4 80.0 - 100.0 fL   MCH 24.8 (L) 26.0 - 34.0 pg   MCHC 28.7 (L) 30.0 - 36.0 g/dL   RDW 19.9 (H) 11.5 - 15.5 %    Platelets 78 (L) 150 - 400 K/uL    Comment: PLATELET COUNT CONFIRMED BY SMEAR SPECIMEN CHECKED FOR CLOTS Immature Platelet Fraction may be clinically indicated, consider ordering this additional test [id number]    nRBC 1.3 (H) 0.0 - 0.2 %   Neutrophils Relative % 58 %   Neutro Abs 3.5 1.7 - 7.7 K/uL   Lymphocytes Relative 36 %   Lymphs Abs 2.1 0.7 - 4.0 K/uL   Monocytes Relative 5 %   Monocytes Absolute 0.3 0.1 - 1.0 K/uL   Eosinophils Relative 0 %   Eosinophils Absolute 0.0 0.0 - 0.5 K/uL   Basophils Relative 0 %   Basophils Absolute 0.0 0.0 - 0.1 K/uL   Immature Granulocytes 1 %   Abs Immature Granulocytes 0.06 0.00 - 0.07 K/uL    Comment: Performed at [facility], [address] [place], [place] [address]  Lactic acid, plasma     Status:  None   Collection Time: 12/10/18  5:22 PM  Result Value Ref Range   Lactic Acid, Venous 1.9 0.5 - 1.9 mmol/L    Comment: Performed at [facility], [address] [place], [place] [address]  SARS Coronavirus 2 [facility] order, Performed in [facility] hospital lab) Nasopharyngeal Nasopharyngeal Swab     Status: None   Collection Time: 12/10/18  9:05 PM   Specimen: Nasopharyngeal Swab  Result Value Ref Range   SARS Coronavirus 2 NEGATIVE NEGATIVE    Comment: (NOTE) If result is NEGATIVE SARS-CoV-2 target nucleic acids are NOT DETECTED. The SARS-CoV-2 RNA is generally detectable in upper and lower  respiratory specimens during the acute phase of infection. The lowest  concentration of SARS-CoV-2 viral copies this assay can detect is 250  copies / mL. A negative result does not preclude SARS-CoV-2 infection  and should not be used as the sole basis for treatment or other  patient management decisions.  A negative result may occur with  improper specimen collection / handling, submission of specimen other  than nasopharyngeal swab, presence of viral mutation(s) within the  areas targeted by this assay, and inadequate number of viral copies   (<250 copies / mL). A negative result must be combined with clinical  observations, patient history, and epidemiological information. If result is POSITIVE SARS-CoV-2 target nucleic acids are DETECTED. The SARS-CoV-2 RNA is generally detectable in upper and lower  respiratory specimens dur ing the acute phase of infection.  Positive  results are indicative of active infection with SARS-CoV-2.  Clinical  correlation with patient history and other diagnostic information is  necessary to determine patient infection status.  Positive results do  not rule out bacterial infection or co-infection with other viruses. If result is PRESUMPTIVE POSTIVE SARS-CoV-2 nucleic acids MAY BE PRESENT.   A presumptive positive result was obtained on the submitted specimen  and confirmed on repeat testing.  While 2019 novel coronavirus  (SARS-CoV-2) nucleic acids may be present in the submitted sample  additional confirmatory testing may be necessary for epidemiological  and / or clinical management purposes  to differentiate between  SARS-CoV-2 and other Sarbecovirus currently known to infect humans.  If clinically indicated additional testing with an alternate test  methodology [phone]) is advised. The SARS-CoV-2 RNA is generally  detectable in upper and lower respiratory sp ecimens during the acute  phase of infection. The expected result is Negative. Fact Sheet for Patients:  StrictlyIdeas.no Fact Sheet for Healthcare Providers: BankingDealers.co.za This test is not yet approved or cleared by the [place] FDA and has been authorized for detection and/or diagnosis of SARS-CoV-2 by FDA under an Emergency Use Authorization (EUA).  This EUA will remain in effect (meaning this test can be used) for the duration of the COVID-19 declaration under Section 564(b)(1) of the Act, 21 U.S.C. section 360bbb-3(b)(1), unless the authorization is terminated or  revoked sooner. Performed at [facility], [address], [place], [place] [address]     Chemistries  Recent Labs  Lab 12/10/18 1722  NA 156*  K 3.7  CL 127*  CO2 18*  GLUCOSE 93  BUN 46*  CREATININE 2.90*  CALCIUM 7.7*  AST 47*  ALT 18  ALKPHOS 397*  BILITOT 1.4*   ------------------------------------------------------------------------------------------------------------------  ------------------------------------------------------------------------------------------------------------------ GFR: CrCl cannot be calculated (Unknown ideal weight.). Liver Function Tests: Recent Labs  Lab 12/10/18 1722  AST 47*  ALT 18  ALKPHOS 397*  BILITOT 1.4*  PROT 6.2*  ALBUMIN 1.6*   Recent Labs  Lab 12/10/18 1722  LIPASE 45    --------------------------------------------------------------------------------------------------------------- Urine analysis:    Component Value Date/Time   COLORURINE AMBER (A) 11/12/2018 1911   APPEARANCEUR HAZY (A) 11/12/2018 1911   LABSPEC 1.016 11/12/2018 1911   PHURINE 5.0 11/12/2018 1911   GLUCOSEU NEGATIVE 11/12/2018 1911   HGBUR NEGATIVE 11/12/2018 1911   BILIRUBINUR NEGATIVE 11/12/2018 1911   KETONESUR NEGATIVE 11/12/2018 1911   PROTEINUR NEGATIVE 11/12/2018 1911   UROBILINOGEN 0.2 07/17/2013 2240   NITRITE NEGATIVE 11/12/2018 1911   LEUKOCYTESUR MODERATE (A) 11/12/2018 1911      Imaging Results:      Assessment & Plan:    Active Problems:   AKI (acute kidney injury) ([place])   1. Acute kidney injury-patient presented with acute kidney injury with creatinine 2.90, her baseline creatinine as of November 12, 2018 was 1.89.  Started on 0.45% normal saline at 100 mL/h as patient also has hypernatremia with sodium 156.  Follow BMP in a.m.  2. Hyponatremia-appears chronic, patient sodium was 152 on 11/12/2018.  Started on 0.45% normal saline as above.  Follow sodium level in a.m.  3. Enterocutaneous fistula-seen on CT scan of the  abdomen pelvis, general surgery was consulted by ED physician.  No immediate surgical intervention required.  General surgery will see patient in a.m.  4. ?  Cholecystitis-CT scan showed inflammation around gallbladder.  Will obtain abdominal ultrasound in a.m.  Will initiate empiric antibiotics IV Zosyn.  5. History of wide-complex tachycardia/CAD-continue amiodarone, Coreg.  6. Diabetes mellitus-we will keep patient n.p.o., check CBG every 4 hours, sliding scale insulin with NovoLog.  7.  Patient's home medication list prednisone 40 mg daily, reviewed discharge summary from 09/19/2018, patient was supposed to be tapered off prednisone.  I called and discussed with patient's husband, who says patient is not taking this medicine for over a month.  Would not restart prednisone at this time.   DVT Prophylaxis-Heparin  AM Labs Ordered, also please review Full Orders  Family Communication: Admission, patients condition and plan of care including tests being ordered have been discussed with the patient  who indicate understanding and agree with the plan and Code Status.  Code Status: DNR  Admission status: Inpatient: Based on patients clinical presentation and evaluation of above clinical data, I have made determination that patient meets Inpatient criteria at this time.  Time spent in minutes : 60 minutes   Oswald Hillock M.D on 12/10/2018 at 10:05 PM

## 2018-12-10 NOTE — ED Provider Notes (Signed)
Emergency Department Provider Note   I have reviewed the triage vital signs and the nursing notes.   HISTORY  Chief Complaint Wound Infection   HPI [name] is a 78 y.o. female with PMH reviewed below presents to the ED for evaluation of drainage coming from her abdominal wound. Family called EMS today with concern for "feces" coming from the wound. No report of fever by family or the patient. Patient is not having abdominal pain. She notes some decreased appetite. No SOB or CP. No UTI symptoms. No radiation of symptoms or modifying factors.   Past Medical History:  Diagnosis Date  . Anemia    Hgb of 9-10  . Anemia of chronic disease    Hgb of 9-10 chronically; 06/2010: H&H-10.7/33.5, MCV-81, normal iron studies in 2010   . Arteriosclerotic cardiovascular disease (ASCVD)    Remote PTCA by patient report; LBBB; associated cardiomyopathy, presumed ischemic with EF 40-45% previously, 20% in 06/2009; h/o clinical congestive heart failure; negative stress nuclear in 2009 with inferoseptal and apical scar  . Automatic implantable cardioverter-defibrillator in situ   . Breast cancer ([place])    breast  . Chronic bronchitis ([place])   . Chronic renal disease, stage 3, moderately decreased glomerular filtration rate (GFR) between 30-59 mL/min/1.73 square meter (HCC) 02/01/2016  . Congestive heart failure (CHF) ([place])   . Elevated cholesterol   . Elevated sed rate   . GERD (gastroesophageal reflux disease)   . GI bleed   . Gout   . HOH (hard of hearing)   . Hyperlipidemia   . Hypertension   . Rheumatoid arthritis ([place])   . UTI (urinary tract infection)     Patient Active Problem List   Diagnosis Date Noted  . Goals of care, counseling/discussion   . Palliative care by specialist   . Acute respiratory failure with hypoxia ([place])   . Protein-calorie malnutrition, severe 08/25/2018  . Wide-complex tachycardia ([place])   . Acute renal failure superimposed on stage 3 chronic kidney  disease ([place]) 08/18/2018  . SBO (small bowel obstruction) ([place]) 08/14/2018  . Pressure injury of skin 08/14/2018  . CAP (community acquired pneumonia) 08/13/2018  . AKI (acute kidney injury) ([place]) 08/10/2017  . Hyperkalemia 08/10/2017  . Acute blood loss anemia 09/20/2016  . Ischemic cardiomyopathy 09/20/2016  . Chronic combined systolic and diastolic CHF (congestive heart failure) ([place]) 09/20/2016  . CKD (chronic kidney disease), stage IV ([place]) 09/20/2016  . History of breast cancer 08/02/2016  . Primary osteoarthritis of both knees 08/02/2016  . Primary osteoarthritis of both feet 08/02/2016  . History of anemia 07/17/2016  . History of CHF (congestive heart failure) 06/04/2016  . History of chronic kidney disease 06/04/2016  . Symptomatic anemia 06/04/2016  . Elevated sedimentation rate 03/03/2016  . Idiopathic chronic gout of multiple sites without tophus 03/03/2016  . Total knee replacement status, right 03/03/2016  . High risk medication use 03/03/2016  . Chronic kidney disease (CKD) stage G3b/A1, moderately decreased glomerular filtration rate (GFR) between 30-44 mL/min/1.73 square meter and albuminuria creatinine ratio less than 30 mg/g (HCC) 02/01/2016  . CAD S/P remote PCI- no details 09/02/2014  . Cardiomyopathy, ischemic-EF 30-35% March 2015 09/02/2014  . Diastolic dysfunction-grade 2 09/02/2014  . CHF exacerbation ([place]) 08/28/2014  . Acute on chronic combined systolic and diastolic congestive heart failure ([place]) 08/28/2014  . Iron deficiency anemia 08/20/2013  . Malnutrition of moderate degree ([place]) 07/21/2013  . PNA (pneumonia) 07/19/2013  . HCAP (healthcare-associated pneumonia) 07/17/2013  . Sepsis (  [place]) 07/17/2013  . ARF (acute renal failure) ([place]) 07/17/2013  . Rheumatoid arthritis ([place]) 07/04/2013  . Hypokalemia 07/03/2013  . Chest pain 07/03/2013  . Biventricular ICD in place (MDT 2014) 11/06/2012  . Anemia of chronic disease   . Breast cancer ([place])   .  Hypertension   . Dyslipidemia 05/24/2009  . Chronic systolic heart failure ([place]) 05/24/2009  . Primary osteoarthritis of both hands 08/11/2007    Past Surgical History:  Procedure Laterality Date  . BI-VENTRICULAR IMPLANTABLE CARDIOVERTER DEFIBRILLATOR N/A 07/28/2012   Procedure: BI-VENTRICULAR IMPLANTABLE CARDIOVERTER DEFIBRILLATOR  (CRT-D);  Surgeon: Evans Lance, MD;  Location: [facility] CATH LAB;  Service: Cardiovascular;  Laterality: N/A;  . BI-VENTRICULAR IMPLANTABLE CARDIOVERTER DEFIBRILLATOR  (CRT-D)  07/28/2012  . BOWEL RESECTION N/A 08/21/2018   Procedure: Small Bowel Resection;  Surgeon: Jovita Kussmaul, MD;  Location: [facility];  Service: General;  Laterality: N/A;  . BREAST BIOPSY Bilateral   . CATARACT EXTRACTION W/ INTRAOCULAR LENS IMPLANT Left   . CATARACT EXTRACTION W/PHACO Right 09/15/2013   Procedure: CATARACT EXTRACTION PHACO AND INTRAOCULAR LENS PLACEMENT (IOC);  Surgeon: Elta Guadeloupe T. Gershon Crane, MD;  Location: [facility] ORS;  Service: Ophthalmology;  Laterality: Right;  CDE:  10.74  . COLONOSCOPY N/A 03/04/2015   Procedure: COLONOSCOPY;  Surgeon: Rogene Houston, MD;  Location: [facility] ENDO SUITE;  Service: Endoscopy;  Laterality: N/A;  10:50   . ESOPHAGOGASTRODUODENOSCOPY N/A 03/04/2015   Procedure: ESOPHAGOGASTRODUODENOSCOPY (EGD);  Surgeon: Rogene Houston, MD;  Location[facility];  Service: Endoscopy;  Laterality: N/A;  . ESOPHAGOGASTRODUODENOSCOPY N/A 09/21/2016   Procedure: ESOPHAGOGASTRODUODENOSCOPY (EGD);  Surgeon: Rogene Houston, MD;  [facility];  Service: Endoscopy;  Laterality: N/A;  . GIVENS CAPSULE STUDY N/A 09/22/2016   Procedure: GIVENS CAPSULE STUDY;  Surgeon: Rogene Houston, MD;  [facility];  Service: Endoscopy;  Laterality: N/A;  . LAPAROTOMY N/A 08/21/2018   Procedure: EXPLORATORY LAPAROTOMY WITH LYSIS OF ADHESIONS;  Surgeon: Jovita Kussmaul, MD;  Location: [facility];  Service: General;  Laterality: N/A;  . MASTECTOMY Left 1998  . TENDON TRANSFER Right  08/15/2017   Procedure: TENDON TRANSFER RIGHT WRIST EXTENSORS AS NEEDED, ULNA RESECTION AND STABILIZATION;  Surgeon: Charlotte Crumb, MD;  Location: [facility];  Service: Orthopedics;  Laterality: Right;  . TOTAL KNEE ARTHROPLASTY Right    Dr.Harrison  . TUBAL LIGATION      Allergies Macrobid [nitrofurantoin macrocrystal]  Family History  Problem Relation Age of Onset  . Diabetes Father   . Pancreatic cancer Father   . Hypertension Brother     Social History Social History   Tobacco Use  . Smoking status: Former Smoker    Packs/day: 0.25    Years: 30.00    Pack years: 7.50    Types: Cigarettes    Start date: 09/17/1956    Quit date: 04/24/2007    Years since quitting: 11.6  . Smokeless tobacco: Current User    Types: Chew  . Tobacco comment: 07/03/2013 "smoked some; don't know how much or how Addalynne Golding or when I quit"  Substance Use Topics  . Alcohol use: No    Alcohol/week: 0.0 standard drinks  . Drug use: No    Review of Systems  Constitutional: No fever/chills Eyes: No visual changes. ENT: No sore throat. Cardiovascular: Denies chest pain. Respiratory: Denies shortness of breath. Gastrointestinal: No abdominal pain.  No nausea, no vomiting.  No diarrhea.  No constipation. Genitourinary: Negative for dysuria. Musculoskeletal: Negative for back pain. Skin: Negative  for rash. Positive drainage from abdominal wound.  Neurological: Negative for headaches, focal weakness or numbness.  10-point ROS otherwise negative.  ____________________________________________   PHYSICAL EXAM:  VITAL SIGNS: ED Triage Vitals [12/10/18 1633]  Enc Vitals Group     BP (!) 141/67     Pulse Rate 96     Resp 20     Temp 97.6 F (36.4 C)     Temp Source Oral     SpO2 97 %   Constitutional: Alert and oriented. Well appearing and in no acute distress. Eyes: Conjunctivae are normal.  Head: Atraumatic. Nose: No congestion/rhinnorhea. Mouth/Throat: Mucous membranes are moist.  Neck: No  stridor.  Cardiovascular: Normal rate, regular rhythm. Good peripheral circulation. Grossly normal heart sounds.   Respiratory: Normal respiratory effort.  No retractions. Lungs CTAB. Gastrointestinal: Soft and nontender. Lower abdominal incision is open and healing by secondary intention. Good granulation tissue. Moderate purulent discharge coming from the inferior aspect of the incision. Fluid expressed and area cleaned. No additional drainage. No distention.  Musculoskeletal: No lower extremity tenderness nor edema. No gross deformities of extremities. Neurologic:  Normal speech and language. No gross focal neurologic deficits are appreciated.  Skin:  Skin is warm, dry and intact. No rash noted.   ____________________________________________   LABS (all labs ordered are listed, but only abnormal results are displayed)  Labs Reviewed  COMPREHENSIVE METABOLIC PANEL - Abnormal; Notable for the following components:      Result Value   Sodium 156 (*)    Chloride 127 (*)    CO2 18 (*)    BUN 46 (*)    Creatinine, Ser 2.90 (*)    Calcium 7.7 (*)    Total Protein 6.2 (*)    Albumin 1.6 (*)    AST 47 (*)    Alkaline Phosphatase 397 (*)    Total Bilirubin 1.4 (*)    GFR calc non Af Amer 15 (*)    GFR calc Af Amer 17 (*)    All other components within normal limits  CBC WITH DIFFERENTIAL/PLATELET - Abnormal; Notable for the following components:   Hemoglobin 10.6 (*)    MCH 24.8 (*)    MCHC 28.7 (*)    RDW 19.9 (*)    Platelets 78 (*)    nRBC 1.3 (*)    All other components within normal limits  SARS CORONAVIRUS 2 (HOSPITAL ORDER, [facility])  LIPASE, BLOOD  LACTIC ACID, PLASMA    ____________________________________________  RADIOLOGY  CT imaging reviewed and prelim read discussed with radiology.  ____________________________________________   PROCEDURES  Procedure(s) performed:   Procedures  None  ____________________________________________   INITIAL IMPRESSION / ASSESSMENT AND PLAN / ED COURSE  Pertinent labs & imaging results that were available during my care of the patient were reviewed by me and considered in my medical decision making (see chart for details).   Patient presents to the emergency department with purulent drainage from the inferior aspect of her open abdominal wound.  The wound is healing by secondary intention after small bowel resection, lysis of adhesions, and appendectomy and April 2020.  The procedure was performed by Dr. Marlou Starks.  There was some purulent drainage coming from the inferior aspect of the wound.  There is a small opening there.  I was able to clean the area and express some purulent material from the opening to the point where no additional purulent material is coming from that area.  Patient is afebrile with otherwise  normal vital signs.  She is not experiencing abdominal pain.  Plan for CT imaging of the abdomen and pelvis along with screening lab work.  09:00 PM  Spoke with the radiologist verbally about the results.  Results are unable to cross into our system at this time due to a technical issue.  There is an enterocutaneous fistula and some inflammation around the gallbladder.  No acute cholecystitis.  Patient is not focally tender in the right upper quadrant or having vomiting.  Spoke with Dr. Rosendo Gros.  Agrees with plan for medical admission given her AKI and hypernatremia.  They can consult regarding the enterocutaneous fistula.  Could consider right upper quadrant ultrasound for further evaluation of the gallbladder but patient not having symptoms at this time.  No ultrasound available at this hour here at [facility]. Called to update patient's nephew [name] by phone to discuss plan.   Discussed patient's case with [facility], Dr. Darrick Meigs to request admission. Patient and family (if present) updated with plan. Care transferred to [facility] service.  I reviewed all  nursing notes, vitals, pertinent old records, EKGs, labs, imaging (as available).  ____________________________________________  FINAL CLINICAL IMPRESSION(S) / ED DIAGNOSES  Final diagnoses:  AKI (acute kidney injury) ([place])  Hypernatremia  Enterocutaneous fistula    MEDICATIONS GIVEN DURING THIS VISIT:  Medications  lactated ringers infusion (has no administration in time range)    Note:  This document was prepared using Dragon voice recognition software and may include unintentional dictation errors.  Nanda Quinton, MD Emergency Medicine    Daionna Crossland, [facility], MD 12/11/18 1314

## 2018-12-11 ENCOUNTER — Inpatient Hospital Stay (HOSPITAL_COMMUNITY): Payer: Medicare Other

## 2018-12-11 DIAGNOSIS — L89150 Pressure ulcer of sacral region, unstageable: Secondary | ICD-10-CM | POA: Diagnosis present

## 2018-12-11 DIAGNOSIS — E11649 Type 2 diabetes mellitus with hypoglycemia without coma: Secondary | ICD-10-CM

## 2018-12-11 LAB — CBC
HCT: 35.7 % — ABNORMAL LOW (ref 36.0–46.0)
Hemoglobin: 10.8 g/dL — ABNORMAL LOW (ref 12.0–15.0)
MCH: 25.5 pg — ABNORMAL LOW (ref 26.0–34.0)
MCHC: 30.3 g/dL (ref 30.0–36.0)
MCV: 84.2 fL (ref 80.0–100.0)
Platelets: 74 10*3/uL — ABNORMAL LOW (ref 150–400)
RBC: 4.24 MIL/uL (ref 3.87–5.11)
RDW: 20 % — ABNORMAL HIGH (ref 11.5–15.5)
WBC: 5.5 10*3/uL (ref 4.0–10.5)
nRBC: 2.7 % — ABNORMAL HIGH (ref 0.0–0.2)

## 2018-12-11 LAB — COMPREHENSIVE METABOLIC PANEL
ALT: 19 U/L (ref 0–44)
AST: 45 U/L — ABNORMAL HIGH (ref 15–41)
Albumin: 1.4 g/dL — ABNORMAL LOW (ref 3.5–5.0)
Alkaline Phosphatase: 379 U/L — ABNORMAL HIGH (ref 38–126)
Anion gap: 15 (ref 5–15)
BUN: 45 mg/dL — ABNORMAL HIGH (ref 8–23)
CO2: 18 mmol/L — ABNORMAL LOW (ref 22–32)
Calcium: 7.7 mg/dL — ABNORMAL LOW (ref 8.9–10.3)
Chloride: 126 mmol/L — ABNORMAL HIGH (ref 98–111)
Creatinine, Ser: 3.2 mg/dL — ABNORMAL HIGH (ref 0.44–1.00)
GFR calc Af Amer: 15 mL/min — ABNORMAL LOW (ref >60)
GFR calc non Af Amer: 13 mL/min — ABNORMAL LOW (ref >60)
Glucose, Bld: 70 mg/dL (ref 70–99)
Potassium: 4.3 mmol/L (ref 3.5–5.1)
Sodium: 159 mmol/L — ABNORMAL HIGH (ref 135–145)
Total Bilirubin: 1.4 mg/dL — ABNORMAL HIGH (ref 0.3–1.2)
Total Protein: 6.3 g/dL — ABNORMAL LOW (ref 6.5–8.1)

## 2018-12-11 LAB — HEMOGLOBIN A1C
Hgb A1c MFr Bld: 4.7 % — ABNORMAL LOW (ref 4.8–5.6)
Mean Plasma Glucose: 88.19 mg/dL

## 2018-12-11 LAB — SURGICAL PCR SCREEN
MRSA, PCR: POSITIVE — AB
Staphylococcus aureus: POSITIVE — AB

## 2018-12-11 LAB — LACTIC ACID, PLASMA: Lactic Acid, Venous: 1.7 mmol/L (ref 0.5–1.9)

## 2018-12-11 LAB — GLUCOSE, CAPILLARY
Glucose-Capillary: 135 mg/dL — ABNORMAL HIGH (ref 70–99)
Glucose-Capillary: 58 mg/dL — ABNORMAL LOW (ref 70–99)
Glucose-Capillary: 69 mg/dL — ABNORMAL LOW (ref 70–99)
Glucose-Capillary: 96 mg/dL (ref 70–99)
Glucose-Capillary: 129 mg/dL — ABNORMAL HIGH (ref 70–99)
Glucose-Capillary: 107 mg/dL — ABNORMAL HIGH (ref 70–99)
Glucose-Capillary: 89 mg/dL (ref 70–99)

## 2018-12-11 MED ORDER — ACETAMINOPHEN 500 MG PO TABS
500.0000 mg | ORAL_TABLET | Freq: Four times a day (QID) | ORAL | Status: DC | PRN
  Administered 2018-12-17 – 2018-12-18 (×2): 500 mg via ORAL
  Filled 2018-12-11 (×4): qty 1

## 2018-12-11 MED ORDER — SACCHAROMYCES BOULARDII 250 MG PO CAPS
250.0000 mg | ORAL_CAPSULE | Freq: Two times a day (BID) | ORAL | Status: DC
  Administered 2018-12-12 – 2019-01-01 (×37): 250 mg via ORAL
  Filled 2018-12-11 (×39): qty 1

## 2018-12-11 MED ORDER — PIPERACILLIN-TAZOBACTAM 3.375 G IVPB
3.3750 g | Freq: Two times a day (BID) | INTRAVENOUS | Status: DC
  Administered 2018-12-11 – 2018-12-12 (×3): 3.375 g via INTRAVENOUS
  Filled 2018-12-11 (×2): qty 50

## 2018-12-11 MED ORDER — HEPARIN SODIUM (PORCINE) 5000 UNIT/ML IJ SOLN
5000.0000 [IU] | Freq: Three times a day (TID) | INTRAMUSCULAR | Status: DC
  Administered 2018-12-11 – 2018-12-31 (×57): 5000 [IU] via SUBCUTANEOUS
  Filled 2018-12-11 (×53): qty 1

## 2018-12-11 MED ORDER — OXYCODONE HCL 5 MG PO TABS
5.0000 mg | ORAL_TABLET | Freq: Four times a day (QID) | ORAL | Status: DC | PRN
  Filled 2018-12-11 (×3): qty 1

## 2018-12-11 MED ORDER — DEXTROSE 50 % IV SOLN
INTRAVENOUS | Status: AC
  Administered 2018-12-11: 25 mL
  Filled 2018-12-11: qty 50

## 2018-12-11 MED ORDER — PIPERACILLIN-TAZOBACTAM IN DEX 2-0.25 GM/50ML IV SOLN
2.2500 g | Freq: Four times a day (QID) | INTRAVENOUS | Status: DC
  Filled 2018-12-11 (×2): qty 50

## 2018-12-11 MED ORDER — ONDANSETRON HCL 4 MG/2ML IJ SOLN
4.0000 mg | Freq: Four times a day (QID) | INTRAMUSCULAR | Status: DC | PRN
  Administered 2018-12-13 – 2018-12-29 (×5): 4 mg via INTRAVENOUS
  Filled 2018-12-11 (×5): qty 2

## 2018-12-11 MED ORDER — INSULIN ASPART 100 UNIT/ML ~~LOC~~ SOLN
0.0000 [IU] | SUBCUTANEOUS | Status: DC
  Administered 2018-12-11 – 2018-12-13 (×3): 1 [IU] via SUBCUTANEOUS
  Administered 2018-12-13: 2 [IU] via SUBCUTANEOUS
  Administered 2018-12-13: 1 [IU] via SUBCUTANEOUS
  Administered 2018-12-14: 2 [IU] via SUBCUTANEOUS
  Administered 2018-12-14 – 2018-12-15 (×6): 1 [IU] via SUBCUTANEOUS
  Administered 2018-12-15: 2 [IU] via SUBCUTANEOUS
  Administered 2018-12-15: 1 [IU] via SUBCUTANEOUS
  Administered 2018-12-15: 2 [IU] via SUBCUTANEOUS
  Administered 2018-12-15 – 2018-12-16 (×4): 1 [IU] via SUBCUTANEOUS
  Administered 2018-12-17: 2 [IU] via SUBCUTANEOUS
  Administered 2018-12-17 – 2018-12-23 (×3): 1 [IU] via SUBCUTANEOUS

## 2018-12-11 MED ORDER — SODIUM CHLORIDE 0.45 % IV SOLN
INTRAVENOUS | Status: DC
  Administered 2018-12-11 (×3): via INTRAVENOUS

## 2018-12-11 MED ORDER — MIRTAZAPINE 15 MG PO TABS
15.0000 mg | ORAL_TABLET | Freq: Every evening | ORAL | Status: DC
  Administered 2018-12-14 – 2019-01-01 (×18): 15 mg via ORAL
  Filled 2018-12-11 (×19): qty 1

## 2018-12-11 MED ORDER — AMIODARONE HCL 200 MG PO TABS
200.0000 mg | ORAL_TABLET | Freq: Every day | ORAL | Status: DC
  Administered 2018-12-13 – 2019-01-01 (×20): 200 mg via ORAL
  Filled 2018-12-11 (×21): qty 1

## 2018-12-11 MED ORDER — ONDANSETRON HCL 4 MG PO TABS
4.0000 mg | ORAL_TABLET | Freq: Four times a day (QID) | ORAL | Status: DC | PRN
  Administered 2019-01-01: 4 mg via ORAL
  Filled 2018-12-11: qty 1

## 2018-12-11 MED ORDER — ALBUTEROL SULFATE (2.5 MG/3ML) 0.083% IN NEBU: 2.5000 mg | INHALATION_SOLUTION | RESPIRATORY_TRACT | Status: DC | PRN

## 2018-12-11 MED ORDER — CARVEDILOL 3.125 MG PO TABS
3.1250 mg | ORAL_TABLET | Freq: Two times a day (BID) | ORAL | Status: DC
  Administered 2018-12-12 – 2019-01-01 (×40): 3.125 mg via ORAL
  Filled 2018-12-11 (×40): qty 1

## 2018-12-11 MED ORDER — DEXTROSE 10 % IV SOLN
INTRAVENOUS | Status: AC
  Administered 2018-12-11 (×2): via INTRAVENOUS

## 2018-12-11 MED ORDER — PANTOPRAZOLE SODIUM 40 MG PO TBEC
40.0000 mg | DELAYED_RELEASE_TABLET | Freq: Every day | ORAL | Status: DC
  Administered 2018-12-12 – 2018-12-31 (×18): 40 mg via ORAL
  Filled 2018-12-11 (×19): qty 1

## 2018-12-11 NOTE — Care Management (Signed)
Notified by Durene Fruits [facility] that patient is active for home health RN. Please place resumptions orders and notify [facility] at time of DC.

## 2018-12-11 NOTE — Progress Notes (Signed)
Hypoglycemic Event  CBG: 58  Treatment: D50 25 mL (12.5 gm)  Symptoms: None  Follow-up CBG: [id number] CBG Result:135  Possible Reasons for Event: Inadequate meal intake  Comments/MD notified K.Schorr   late entry CBG 58 at [place]

## 2018-12-11 NOTE — Plan of Care (Signed)
  Problem: Clinical Measurements: Goal: Ability to maintain clinical measurements within normal limits will improve Outcome: Progressing Goal: Cardiovascular complication will be avoided Outcome: Progressing   

## 2018-12-11 NOTE — Progress Notes (Signed)
Initial Nutrition Assessment  RD working remotely.  DOCUMENTATION CODES:   Not applicable  INTERVENTION:   -RD will follow for diet advancement and supplement as appropriate -If prolonged NPO status is ancitipated, consider initiation of nutrition support  NUTRITION DIAGNOSIS:   Inadequate oral intake related to altered GI function as evidenced by NPO status.  GOAL:   Patient will meet greater than or equal to 90% of their needs  MONITOR:   Diet advancement, Labs, Weight trends, Skin, I & O's  REASON FOR ASSESSMENT:   Malnutrition Screening Tool    ASSESSMENT:   [name]  is a 78 y.o. female, with history of anemia of chronic disease, ASCVD, AICD placement, history of breast cancer, chronic bronchitis, stage III CKD, systolic heart failure, hyperlipidemia, GERD, gout, hypertension, rheumatoid arthritis who recently had expiratory laparotomy with lysis of adhesions, bowel resection for ischemic bowel in May 2020.  She had large abdominal wound, which is healing by secondary intention.  As per family they noted feces coming from the wound.  No abdominal pain or fever.  Patient was brought to the hospital for further evaluation.  Patient is a poor historian.  History obtained from ED records.  Pt admitted with AKI and EC fistula.   Per H&P, pt with EC fistula and inflammation around the gallbladder. Pt awaiting surgical consult.   Reviewed I/O's: +926 ml x 24 hours  Pt unable to provide history secondary to hearing deficit. She is familiar to this RD due to prolonged hospitalization in May 2020 (persistent SBO s/p ex lap, lysis of adhesions, small bowel resection due to ischemic changes). Pt was discharge to [facility], but no resides at home with home health services.   Reviewed wt hx; noted pt has experienced a 10.5% wt gain over the past 3 months, which is favorable given pt's history of malnutrition.   Medications reviewed and include remeron, florastor, 0.45% sodium  chloride @ 100 ml/hr, and dextrose 10% @ 50 ml/hr.   Labs reviewed: CBGS: 69-135 (inpatient orders for glycemic control are 0-9 units insulin aspart every 4 hours).   Diet Order:   Diet Order            Diet NPO time specified  Diet effective now              EDUCATION NEEDS:   Not appropriate for education at this time  Skin:  Skin Assessment: Skin Integrity Issues: Skin Integrity Issues:: Stage II, Other (Comment) Stage II: buttocks Other: open abdomen with draining fistula  Last BM:  Unknown  Height:   Ht Readings from Last 1 Encounters:  12/10/18 5\' 3"  (1.6 m)    Weight:   Wt Readings from Last 1 Encounters:  12/10/18 67.1 kg    Ideal Body Weight:  52.3 kg  BMI:  Body mass index is 26.22 kg/m.  Estimated Nutritional Needs:   Kcal:  1800-2000  Protein:  90-105 grams  Fluid:  > 1.8 L    Raphael Espe A. Jimmye Norman, RD, LDN, [place] Registered Dietitian II Certified Diabetes Care and Education Specialist Pager: [phone] After hours Pager: [phone]

## 2018-12-11 NOTE — Progress Notes (Addendum)
PROGRESS NOTE  [name] ZCH:[id number] DOB: [birth year] DOA: 12/10/2018 PCP: [name], MD  Brief History   [name]  is a 78 y.o. female, with history of anemia of chronic disease, ASCVD, AICD placement, history of breast cancer, chronic bronchitis, stage III CKD, systolic heart failure, hyperlipidemia, GERD, gout, hypertension, rheumatoid arthritis who recently had expiratory laparotomy with lysis of adhesions, bowel resection for ischemic bowel in May 2020.  She had large abdominal wound, which is healing by secondary intention.  As per family they noted feces coming from the wound.  No abdominal pain or fever.  Patient was brought to the hospital for further evaluation.  Patient is a poor historian.  History obtained from ED records.  CT of the abdomen pelvis was done, no results are available in epic due to technical issues.  ED physician spoke to radiologist verbally about the results.    As per ED physician, there is an enterocutaneous fistula and inflammation around the gallbladder.  No acute cholecystitis.  General surgery, Dr. Rosendo Gros was consulted, he recommended no immediate surgery at this time.  Recommended patient to transfer to [facility] and surgery will see patient in a.m.  Consultants  . General Surgery  Procedures  . None  Antibiotics   Anti-infectives (From admission, onward)   Start     Dose/Rate Route Frequency Ordered Stop   12/11/18 1000  piperacillin-tazobactam (ZOSYN) IVPB 3.375 g  Status:  Discontinued     3.375 g 12.5 mL/hr over 240 Minutes Intravenous Every 12 hours 12/10/18 2220 12/11/18 0522   12/11/18 1000  piperacillin-tazobactam (ZOSYN) IVPB 3.375 g     3.375 g 12.5 mL/hr over 240 Minutes Intravenous Every 12 hours 12/11/18 0524     12/11/18 0800  piperacillin-tazobactam (ZOSYN) IVPB 2.25 g  Status:  Discontinued     2.25 g 100 mL/hr over 30 Minutes Intravenous Every 6 hours 12/11/18 0522 12/11/18 0524   12/10/18 2215   piperacillin-tazobactam (ZOSYN) IVPB 3.375 g     3.375 g 100 mL/hr over 30 Minutes Intravenous  Once 12/10/18 2211 12/10/18 2256    .   Subjective  The patient is lying quietly. No new complaints.  Objective   Vitals:  Vitals:   12/11/18 0435 12/11/18 1310  BP: (!) 109/59 99/84  Pulse: 97 86  Resp: 16 16  Temp: 97.7 F (36.5 C) 98.1 F (36.7 C)  SpO2: 100% 100%    Exam:  Constitutional:  . The patient is awake, alert, and oriented x 3. No acute distress. Respiratory:  . No increased work of breathing. . No wheezes, rales, or rhonchi. . No tactile fremitus. Cardiovascular:  . Regular rate and rhythm. . No murmurs, ectopy, or gallups. . No lateral PMI. No thrills. Abdomen:  . Abdomen is soft, non-tender, non-distended. . No hernias, masses, or organomegaly. . Hypoactive bowel sounds. Musculoskeletal:  . No cyanosis, clubbing, or edema Skin:  . No rashes, lesions, ulcers . palpation of skin: no induration or nodules Neurologic:  . CN 2-12 intact . Sensation all 4 extremities intact Psychiatric:  . Mental status o Mood, affect appropriate o Orientation to person, place, time  . judgment and insight appear intact     I have personally reviewed the following:   Today's Data  . CMP, CBC, Hemoglobin A1c, Vitals  Micro Data  . Nares MSSA and MRSA positive.  Imaging  . CT abdomen: Entero-cutaneous fistula  Scheduled Meds: . amiodarone  200 mg Oral Daily  . carvedilol  3.125  mg Oral BID WC  . heparin  5,000 Units Subcutaneous Q8H  . insulin aspart  0-9 Units Subcutaneous Q4H  . mirtazapine  15 mg Oral QPM  . pantoprazole  40 mg Oral Daily  . saccharomyces boulardii  250 mg Oral BID   Continuous Infusions: . sodium chloride 100 mL/hr at 12/11/18 1720  . dextrose 50 mL/hr at 12/11/18 1720  . piperacillin-tazobactam (ZOSYN)  IV Stopped (12/11/18 1548)    Active Problems:   AKI (acute kidney injury) ([place])   Unstageable pressure ulcer of sacral region  ([place])   LOS: 1 day   A & P  1. Acute kidney injury-patient presented with acute kidney injury with creatinine 2.90, her baseline creatinine as of November 12, 2018 was 1.89.  Started on 0.45% normal saline at 100 mL/h as patient also has hypernatremia with sodium 156.  Follow BMP in a.m.  2. Hyponatremia-appears chronic, patient sodium was 152 on 11/12/2018.  Started on 0.45% normal saline as above.  Follow sodium level in a.m.  3. 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  No immediate surgical intervention required.  General surgery (Dr. Rosendo Gros) will see patient today.  4. Cholecystitis-CT scan showed inflammation around gallbladder.  Will obtain abdominal ultrasound in a.m.  Will initiate empiric antibiotics IV Zosyn.  5. History of wide-complex tachycardia/CAD-continue amiodarone, Coreg.  Diabetes mellitus/hypoglycemia: IV D10 with IV 1//2 NS. Monitor. HbA1c 4.7.  DVT prophylaxis: Heparin Code Status: Full Code Family Communication: No family present. Disposition Plan: tbd.    Ahmod Gillespie, DO [facility] Direct contact: see [email or web address]  7PM-7AM contact night coverage as above 12/11/2018, 7:27 PM  LOS: 1 day

## 2018-12-12 ENCOUNTER — Inpatient Hospital Stay: Payer: Self-pay

## 2018-12-12 ENCOUNTER — Inpatient Hospital Stay (HOSPITAL_COMMUNITY): Payer: Medicare Other

## 2018-12-12 HISTORY — PX: IR FLUORO GUIDE CV LINE RIGHT: IMG2283

## 2018-12-12 HISTORY — PX: IR US GUIDE VASC ACCESS RIGHT: IMG2390

## 2018-12-12 LAB — CBC WITH DIFFERENTIAL/PLATELET
Abs Immature Granulocytes: 0.14 10*3/uL — ABNORMAL HIGH (ref 0.00–0.07)
Basophils Absolute: 0 10*3/uL (ref 0.0–0.1)
Basophils Relative: 1 %
Eosinophils Absolute: 0.1 10*3/uL (ref 0.0–0.5)
Eosinophils Relative: 2 %
HCT: 29.7 % — ABNORMAL LOW (ref 36.0–46.0)
Hemoglobin: 8.9 g/dL — ABNORMAL LOW (ref 12.0–15.0)
Immature Granulocytes: 3 %
Lymphocytes Relative: 43 %
Lymphs Abs: 1.9 10*3/uL (ref 0.7–4.0)
MCH: 25 pg — ABNORMAL LOW (ref 26.0–34.0)
MCHC: 30 g/dL (ref 30.0–36.0)
MCV: 83.4 fL (ref 80.0–100.0)
Monocytes Absolute: 0.4 10*3/uL (ref 0.1–1.0)
Monocytes Relative: 10 %
Neutro Abs: 1.7 10*3/uL (ref 1.7–7.7)
Neutrophils Relative %: 41 %
Platelets: 60 10*3/uL — ABNORMAL LOW (ref 150–400)
RBC: 3.56 MIL/uL — ABNORMAL LOW (ref 3.87–5.11)
RDW: 19.4 % — ABNORMAL HIGH (ref 11.5–15.5)
WBC: 4.2 10*3/uL (ref 4.0–10.5)
nRBC: 2.8 % — ABNORMAL HIGH (ref 0.0–0.2)

## 2018-12-12 LAB — GLUCOSE, CAPILLARY
Glucose-Capillary: 110 mg/dL — ABNORMAL HIGH (ref 70–99)
Glucose-Capillary: 129 mg/dL — ABNORMAL HIGH (ref 70–99)
Glucose-Capillary: 181 mg/dL — ABNORMAL HIGH (ref 70–99)
Glucose-Capillary: 49 mg/dL — ABNORMAL LOW (ref 70–99)
Glucose-Capillary: 49 mg/dL — ABNORMAL LOW (ref 70–99)
Glucose-Capillary: 61 mg/dL — ABNORMAL LOW (ref 70–99)
Glucose-Capillary: 78 mg/dL (ref 70–99)
Glucose-Capillary: 97 mg/dL (ref 70–99)

## 2018-12-12 LAB — COMPREHENSIVE METABOLIC PANEL
ALT: 16 U/L (ref 0–44)
AST: 34 U/L (ref 15–41)
Albumin: 1.1 g/dL — ABNORMAL LOW (ref 3.5–5.0)
Alkaline Phosphatase: 272 U/L — ABNORMAL HIGH (ref 38–126)
Anion gap: 9 (ref 5–15)
BUN: 38 mg/dL — ABNORMAL HIGH (ref 8–23)
CO2: 18 mmol/L — ABNORMAL LOW (ref 22–32)
Calcium: 7 mg/dL — ABNORMAL LOW (ref 8.9–10.3)
Chloride: 120 mmol/L — ABNORMAL HIGH (ref 98–111)
Creatinine, Ser: 2.94 mg/dL — ABNORMAL HIGH (ref 0.44–1.00)
GFR calc Af Amer: 17 mL/min — ABNORMAL LOW (ref >60)
GFR calc non Af Amer: 14 mL/min — ABNORMAL LOW (ref >60)
Glucose, Bld: 110 mg/dL — ABNORMAL HIGH (ref 70–99)
Potassium: 3.1 mmol/L — ABNORMAL LOW (ref 3.5–5.1)
Sodium: 147 mmol/L — ABNORMAL HIGH (ref 135–145)
Total Bilirubin: 1 mg/dL (ref 0.3–1.2)
Total Protein: 5.1 g/dL — ABNORMAL LOW (ref 6.5–8.1)

## 2018-12-12 LAB — MAGNESIUM: Magnesium: 1.2 mg/dL — ABNORMAL LOW (ref 1.7–2.4)

## 2018-12-12 LAB — PREALBUMIN: Prealbumin: 7 mg/dL — ABNORMAL LOW (ref 18–38)

## 2018-12-12 LAB — PHOSPHORUS: Phosphorus: 2.1 mg/dL — ABNORMAL LOW (ref 2.5–4.6)

## 2018-12-12 MED ORDER — DEXTROSE 5 % IV SOLN: INTRAVENOUS | Status: DC

## 2018-12-12 MED ORDER — TRAVASOL 10 % IV SOLN
INTRAVENOUS | Status: AC
  Filled 2018-12-12: qty 432

## 2018-12-12 MED ORDER — SODIUM CHLORIDE 0.9% FLUSH
10.0000 mL | INTRAVENOUS | Status: DC | PRN
  Administered 2018-12-13 – 2018-12-15 (×3): 10 mL
  Administered 2018-12-18: 20 mL
  Administered 2018-12-19 – 2018-12-30 (×3): 10 mL
  Filled 2018-12-12 (×7): qty 40

## 2018-12-12 MED ORDER — CHLORHEXIDINE GLUCONATE CLOTH 2 % EX PADS
6.0000 | MEDICATED_PAD | Freq: Every day | CUTANEOUS | Status: DC
  Administered 2018-12-12 – 2019-01-01 (×19): 6 via TOPICAL

## 2018-12-12 MED ORDER — POTASSIUM CHLORIDE 10 MEQ/100ML IV SOLN
10.0000 meq | INTRAVENOUS | Status: AC
  Administered 2018-12-12 (×4): 10 meq via INTRAVENOUS
  Filled 2018-12-12 (×2): qty 100

## 2018-12-12 MED ORDER — TRAVASOL 10 % IV SOLN
INTRAVENOUS | Status: DC
  Administered 2018-12-12: 19:00:00 via INTRAVENOUS
  Filled 2018-12-12: qty 432

## 2018-12-12 MED ORDER — LIDOCAINE HCL 1 % IJ SOLN
INTRAMUSCULAR | Status: DC | PRN
  Administered 2018-12-12: 10 mL

## 2018-12-12 MED ORDER — SODIUM PHOSPHATES 45 MMOLE/15ML IV SOLN
10.0000 mmol | Freq: Once | INTRAVENOUS | Status: AC
  Administered 2018-12-12: 10 mmol via INTRAVENOUS
  Filled 2018-12-12: qty 3.33

## 2018-12-12 MED ORDER — MAGNESIUM SULFATE 2 GM/50ML IV SOLN
2.0000 g | Freq: Once | INTRAVENOUS | Status: AC
  Administered 2018-12-12: 2 g via INTRAVENOUS
  Filled 2018-12-12: qty 50

## 2018-12-12 MED ORDER — SODIUM PHOSPHATES 45 MMOLE/15ML IV SOLN
10.0000 mmol | Freq: Once | INTRAVENOUS | Status: AC
  Administered 2018-12-12: 19:00:00 10 mmol via INTRAVENOUS
  Filled 2018-12-12: qty 3.33

## 2018-12-12 MED ORDER — GLUCAGON HCL RDNA (DIAGNOSTIC) 1 MG IJ SOLR
1.0000 mg | Freq: Once | INTRAMUSCULAR | Status: AC | PRN
  Administered 2018-12-12: 1 mg via INTRAMUSCULAR
  Filled 2018-12-12: qty 1

## 2018-12-12 MED ORDER — DEXTROSE 5 % IV SOLN
INTRAVENOUS | Status: DC
  Administered 2018-12-12: 22:00:00 via INTRAVENOUS

## 2018-12-12 MED ORDER — GLUCAGON HCL RDNA (DIAGNOSTIC) 1 MG IJ SOLR
INTRAMUSCULAR | Status: AC
  Filled 2018-12-12: qty 1

## 2018-12-12 MED ORDER — MUPIROCIN 2 % EX OINT
1.0000 "application " | TOPICAL_OINTMENT | Freq: Two times a day (BID) | CUTANEOUS | Status: AC
  Administered 2018-12-12 – 2018-12-16 (×10): 1 via NASAL
  Filled 2018-12-12: qty 22

## 2018-12-12 MED ORDER — GLUCAGON HCL RDNA (DIAGNOSTIC) 1 MG IJ SOLR
1.0000 mg | Freq: Once | INTRAMUSCULAR | Status: AC | PRN
  Administered 2018-12-12: 1 mg via INTRAMUSCULAR

## 2018-12-12 MED ORDER — LIDOCAINE HCL 1 % IJ SOLN
INTRAMUSCULAR | Status: AC
  Filled 2018-12-12: qty 20

## 2018-12-12 NOTE — Progress Notes (Addendum)
Pharmacy Antibiotic Note  [name] is a 78 y.o. female admitted on 12/10/2018 with intra-abdominal infection.  Continues on Zosyn  Plan: Zosyn 3.375g IV q12h (4 hour infusion). Pharmacy to sign off and follow for changes in renal function  Height: 5\' 3"  (160 cm) Weight: 148 lb (67.1 kg) IBW/kg (Calculated) : 52.4  Temp (24hrs), Avg:97.8 F (36.6 C), Min:97.5 F (36.4 C), Max:98.1 F (36.7 C)  Recent Labs  Lab 12/10/18 1722 12/11/18 0223 12/11/18 1122 12/12/18 0205  WBC 6.0 5.5  --  4.2  CREATININE 2.90* 3.20*  --  2.94*  LATICACIDVEN 1.9  --  1.7  --     Estimated Creatinine Clearance: 14 mL/min (A) (by C-G formula based on SCr of 2.94 mg/dL (H)).    Allergies  Allergen Reactions  . Macrobid [Nitrofurantoin Macrocrystal] Hives and Itching    Antimicrobials this admission: Zosyn 8/19 >>    Thank you for allowing pharmacy to be a part of this patient's care.  Emeline General, PharmD Candidate  Anette Guarneri, PharmD  12/12/2018 10:33 AM

## 2018-12-12 NOTE — Progress Notes (Signed)
Initial Nutrition Assessment  DOCUMENTATION CODES:   Not applicable  INTERVENTION:   -TPN management per pharmacy -RD will follow for diet advancement and supplement as appropriate  NUTRITION DIAGNOSIS:   Inadequate oral intake related to altered GI function as evidenced by NPO status.  Ongoing  GOAL:   Patient will meet greater than or equal to 90% of their needs  Progressing   MONITOR:   Diet advancement, Labs, Weight trends, Skin, I & O's  REASON FOR ASSESSMENT:   Consult New TPN/TNA  ASSESSMENT:   [name]  is a 78 y.o. female, with history of anemia of chronic disease, ASCVD, AICD placement, history of breast cancer, chronic bronchitis, stage III CKD, systolic heart failure, hyperlipidemia, GERD, gout, hypertension, rheumatoid arthritis who recently had expiratory laparotomy with lysis of adhesions, bowel resection for ischemic bowel in May 2020.  She had large abdominal wound, which is healing by secondary intention.  As per family they noted feces coming from the wound.  No abdominal pain or fever.  Patient was brought to the hospital for further evaluation.  Patient is a poor historian.  History obtained from ED records.  8/21- [facility] placed eakin pouch on EC fistula; midline catheter placed; TPN initiated  Reviewed I/O's: +3 L x 24 hours  Per general surgery notes, fistula appears remote form small bowel anastomosis. Eakin pouch placed by [facility] to quantify output.   Per pharmacy note, plan to initiate TPN at 30 ml/hr, which provides 795 kcals and 43 grams protein, meeting 44% of estimated kcal needs and 48% of estimated protein needs.   Medications reviewed and include dextrose 10% infusion @ 75 ml/hr.   Labs reviewed: CBGS: 49-78 (inpatient orders for glycemic control are 0-9 units insulin aspart every 4 hours).   Diet Order:   Diet Order            Diet NPO time specified  Diet effective now              EDUCATION NEEDS:   Not appropriate for  education at this time  Skin:  Skin Assessment: Skin Integrity Issues: Skin Integrity Issues:: Stage II, Other (Comment) Stage II: buttocks Other: open abdomen with draining fistula  Last BM:  Unknown  Height:   Ht Readings from Last 1 Encounters:  12/10/18 5\' 3"  (1.6 m)    Weight:   Wt Readings from Last 1 Encounters:  12/10/18 67.1 kg    Ideal Body Weight:  52.3 kg  BMI:  Body mass index is 26.22 kg/m.  Estimated Nutritional Needs:   Kcal:  1800-2000  Protein:  90-105 grams  Fluid:  > 1.8 L    Heinrich Fertig A. Jimmye Norman, RD, LDN, [place] Registered Dietitian II Certified Diabetes Care and Education Specialist Pager: [phone] After hours Pager: [phone]

## 2018-12-12 NOTE — Procedures (Signed)
Interventional Radiology Procedure Note  Procedure: Tunneled CVC placement  Complications: None  Estimated Blood Loss: < 10 mL  Findings: Right IJ tunneled DL 6 Fr Power Line, 23 cm. Tip at SVC/RA junction. OK to use.  Venetia Night. Kathlene Cote, M.D Pager:  [phone]

## 2018-12-12 NOTE — Progress Notes (Signed)
Received bag of TNA for patient.  Patient at present has no central line in order to infused TNA.  Floor RN notified and is to call MD.

## 2018-12-12 NOTE — Consult Note (Addendum)
[facility] Surgery Consult Note  [name] Jun 05, 1940  [id number].    Requesting MD: Eleonore Chiquito Chief Complaint: Feculent drainage from midline abdominal wound Reason for Consult: Possible EC fistula   HPI: Patient is an 78 year old female who presented to [facility] April 2020 with a small bowel obstruction.  Because of her cardiac history she was transferred to [facility].  She was seen and evaluated by Dr. Autumn Messing.  She was subsequently taken the operating room on 08/21/2018 and underwent exploratory laparotomy with lysis of adhesions, small bowel resection and appendectomy by Dr. Marlou Starks.  She was found to have small bowel obstruction, ischemic and necrotic bowel at that time.  Her wound dehisced post op and was on wet-to-dry dressings with core track placed for nutritional support. She had a difficult postoperative course complicated by acute renal failure, acute respiratory failure, anemia, she also has significant cardiac issues; including coronary disease congestive heart failure with an ICD. She was ultimately discharged to an [place].  She was seen in the ED at [facility] on 10/31/2018 with fever, diagnosed with UTI and sent home on Keflex and potassium.  She presented to the ED from home again on 12/10/2018.  Family told EMS they thought her wound was infected with some fecal drainage coming from the wound. Work-up in the ED showed she was afebrile vital signs were stable. Sodium was 156, chloride 127, BUN 46, creatinine 2.9, alk phos 397, albumin 1.6, AST 47, ALT 18, total protein 6.2, total bilirubin 1.4.  Lactate 1.9, WBC 6.0, hemoglobin 10.6, hematocrit 36.9, platelets 78,000.  Hemoglobin A1c 4.7.  COVID is negative.  CT shows normal-appearing gas-filled stomach, clumped small bowel loops in the low anterior pelvis with a fistulous track of gas extending from the small bowel loop to the skin at the inferior margin of the incisional wound.  Small bowel was otherwise  normal in appearance.  The entire colon is relatively decompressed, sigmoid colon diverticulosis without evidence of acute diverticulitis.  Additional findings include cholelithiasis with a possible stones in the cystic duct, mild gallbladder distention, partially calcified left adnexal mass unchanged from April 2020. Abdominal ultrasound 8/20: Distended gallbladder with cholelithiasis and sludge no evidence of gallbladder wall thickening or acute cholecystitis, no biliary dilatation or choledocholithiasis.  Patient was admitted by medicine and is transferred to [facility] for further evaluation and treatment.  We are asked to see in consultation.   ROS: Review of Systems  Unable to perform ROS: Mental status change    Family History  Problem Relation Age of Onset   Diabetes Father    Pancreatic cancer Father    Hypertension Brother     Past Medical History:  Diagnosis Date   Anemia    Hgb of 9-10   Anemia of chronic disease    Hgb of 9-10 chronically; 06/2010: H&H-10.7/33.5, MCV-81, normal iron studies in 2010    Arteriosclerotic cardiovascular disease (ASCVD)    Remote PTCA by patient report; LBBB; associated cardiomyopathy, presumed ischemic with EF 40-45% previously, 20% in 06/2009; h/o clinical congestive heart failure; negative stress nuclear in 2009 with inferoseptal and apical scar   Automatic implantable cardioverter-defibrillator in situ    Breast cancer (HCC)    breast   Chronic bronchitis (HCC)    Chronic renal disease, stage 3, moderately decreased glomerular filtration rate (GFR) between 30-59 mL/min/1.73 square meter ([place]) 02/01/2016   Congestive heart failure (CHF) (HCC)    Elevated cholesterol    Elevated sed  rate    GERD (gastroesophageal reflux disease)    GI bleed    Gout    HOH (hard of hearing)    Hyperlipidemia    Hypertension    Rheumatoid arthritis ([place])    UTI (urinary tract infection)     Past Surgical History:    Procedure Laterality Date   BI-VENTRICULAR IMPLANTABLE CARDIOVERTER DEFIBRILLATOR N/A 07/28/2012   Procedure: BI-VENTRICULAR IMPLANTABLE CARDIOVERTER DEFIBRILLATOR  (CRT-D);  Surgeon: Evans Lance, MD;  Location: [facility] CATH LAB;  Service: Cardiovascular;  Laterality: N/A;   BI-VENTRICULAR IMPLANTABLE CARDIOVERTER DEFIBRILLATOR  (CRT-D)  07/28/2012   BOWEL RESECTION N/A 08/21/2018   Procedure: Small Bowel Resection;  Surgeon: Jovita Kussmaul, MD;  Location: [place];  Service: General;  Laterality: N/A;   BREAST BIOPSY Bilateral    CATARACT EXTRACTION W/ INTRAOCULAR LENS IMPLANT Left    CATARACT EXTRACTION W/PHACO Right 09/15/2013   Procedure: CATARACT EXTRACTION PHACO AND INTRAOCULAR LENS PLACEMENT ([place]);  Surgeon: Elta Guadeloupe T. Gershon Crane, MD;  Location: [facility] ORS;  Service: Ophthalmology;  Laterality: Right;  CDE:  10.74   COLONOSCOPY N/A 03/04/2015   Procedure: COLONOSCOPY;  Surgeon: Rogene Houston, MD;  Location: [facility] ENDO SUITE;  Service: Endoscopy;  Laterality: N/A;  10:50    ESOPHAGOGASTRODUODENOSCOPY N/A 03/04/2015   Procedure: ESOPHAGOGASTRODUODENOSCOPY (EGD);  Surgeon: Rogene Houston, MD;  Location[facility] ENDO [facility];  Service: Endoscopy;  Laterality: N/A;   ESOPHAGOGASTRODUODENOSCOPY N/A 09/21/2016   Procedure: ESOPHAGOGASTRODUODENOSCOPY (EGD);  Surgeon: Rogene Houston, MD;  [facility];  Service: Endoscopy;  Laterality: N/A;   GIVENS CAPSULE STUDY N/A 09/22/2016   Procedure: GIVENS CAPSULE STUDY;  Surgeon: Rogene Houston, MD;  [facility];  Service: Endoscopy;  Laterality: N/A;   LAPAROTOMY N/A 08/21/2018   Procedure: EXPLORATORY LAPAROTOMY WITH LYSIS OF ADHESIONS;  Surgeon: Jovita Kussmaul, MD;  Location: [place];  Service: General;  Laterality: N/A;   MASTECTOMY Left 1998   TENDON TRANSFER Right 08/15/2017   Procedure: TENDON TRANSFER RIGHT WRIST EXTENSORS AS NEEDED, ULNA RESECTION AND STABILIZATION;  Surgeon: Charlotte Crumb, MD;  Location: [place];  Service: Orthopedics;   Laterality: Right;   TOTAL KNEE ARTHROPLASTY Right    [place]   TUBAL LIGATION      Social History:  reports that she quit smoking about 11 years ago. Her smoking use included cigarettes. She started smoking about 62 years ago. She has a 7.50 pack-year smoking history. Her smokeless tobacco use includes chew. She reports that she does not drink alcohol or use drugs.  Allergies:  Allergies  Allergen Reactions   Macrobid [Nitrofurantoin Macrocrystal] Hives and Itching    Medications Prior to Admission  Medication Sig Dispense Refill   Amino Acids-Protein Hydrolys (FEEDING SUPPLEMENT, PRO-STAT SUGAR FREE 64,) LIQD Place 30 mLs into feeding tube 2 (two) times daily. 887 mL 0   amiodarone (PACERONE) 200 MG tablet Take 1 tablet (200 mg total) by mouth daily.     carvedilol (COREG) 3.125 MG tablet Take 1 tablet (3.125 mg total) by mouth 2 (two) times daily with a meal.     enoxaparin (LOVENOX) 40 MG/0.4ML injection Inject 40 mg into the skin daily.     pantoprazole (PROTONIX) 40 MG tablet Take 1 tablet (40 mg total) by mouth daily.     acetaminophen (TYLENOL) 325 MG tablet Take 2 tablets (650 mg total) by mouth every 6 (six) hours as needed for mild pain (or Fever >/= 101).     acetaminophen (TYLENOL) 500 MG tablet  Take 500 mg by mouth every 6 (six) hours as needed (FOR PAIN/HEADACHES).     acetaminophen (TYLENOL) 650 MG suppository Place 1 suppository (650 mg total) rectally every 6 (six) hours as needed for mild pain (or Fever >/= 101). 12 suppository 0   albuterol (PROVENTIL) (2.5 MG/3ML) 0.083% nebulizer solution Take 3 mLs (2.5 mg total) by nebulization every 4 (four) hours as needed for wheezing or shortness of breath. 75 mL 12   cephALEXin (KEFLEX) 500 MG capsule Take 1 capsule (500 mg total) by mouth 2 (two) times daily. 14 capsule 0   chlorhexidine (PERIDEX) 0.12 % solution 15 mLs by Mouth Rinse route 2 (two) times daily. 120 mL 0   insulin aspart (NOVOLOG) 100  UNIT/ML injection Inject 0-20 Units into the skin every 4 (four) hours. 10 mL 11   mirtazapine (REMERON) 15 MG tablet Take 15 mg by mouth every evening.     mouth rinse LIQD solution 15 mLs by Mouth Rinse route 2 times daily at 12 noon and 4 pm.  0   mupirocin ointment (BACTROBAN) 2 % Place into the nose 2 (two) times daily. 22 g 0   Nutritional Supplements (FEEDING SUPPLEMENT, OSMOLITE 1.5 CAL,) LIQD Place 1,000 mLs into feeding tube continuous.  0   ondansetron (ZOFRAN) 4 MG/2ML SOLN injection Inject 2 mLs (4 mg total) into the vein every 6 (six) hours as needed for nausea. 2 mL 0   ondansetron (ZOFRAN-ODT) 4 MG disintegrating tablet Take 1 tablet (4 mg total) by mouth every 6 (six) hours as needed for nausea. 20 tablet 0   oxyCODONE (OXY IR/ROXICODONE) 5 MG immediate release tablet Take 1-2 tablets (5-10 mg total) by mouth every 4 (four) hours as needed for moderate pain or severe pain (23m for moderate pain, 183mfor severe pain). 30 tablet 0   potassium chloride SA (K-DUR) 20 MEQ tablet Take 1 tablet (20 mEq total) by mouth daily. 3 tablet 0   saccharomyces boulardii (FLORASTOR) 250 MG capsule Take 1 capsule (250 mg total) by mouth 2 (two) times daily.      Blood pressure (!) 98/50, pulse 70, temperature (!) 97.5 F (36.4 C), temperature source Oral, resp. rate 16, height '5\' 3"'  (1.6 m), weight 67.1 kg, SpO2 100 %. Physical Exam: Physical Exam Constitutional:      Appearance: Normal appearance.     Comments: Patient is lying in bed watching TV.  [place] lives, she does not know where she is at.  She cannot tell me the year.  She does not know why she is here.  HENT:     Head: Normocephalic and atraumatic.     Nose: No congestion.     Mouth/Throat:     Mouth: Mucous membranes are dry.     Pharynx: Oropharynx is clear. No oropharyngeal exudate or posterior oropharyngeal erythema.  Eyes:     General: No scleral icterus.       Right eye: No discharge.         Left eye: No discharge.     Comments: Pupils are equal  Cardiovascular:     Rate and Rhythm: Normal rate and regular rhythm.     Heart sounds: No murmur.  Pulmonary:     Effort: Pulmonary effort is normal.     Breath sounds: Normal breath sounds.     Comments: Midline abdominal wound open and clean with good granulation tissue.  At the very base there is a 5 mm open EC  fistula visible.  The drainage appears to be from the small bowel.  Patient is nontender, no distention, no pain on exam. Abdominal:     General: Abdomen is flat. Bowel sounds are normal.     Palpations: Abdomen is soft.     Comments: See picture of open abdominal wound/EC fistula below.  Abdomen is nontender.  Skin:    General: Skin is warm and dry.     Comments: She has a couple small ulcers at the end of each great toe.  Skin is dry  Neurological:     Mental Status: She is alert.     Comments: Patient is not oriented to time or place.  She cannot really give me any history at all.  Psychiatric:     Comments: Her behavior is normal she is in bed with a wick.  She was adamant I needed to pull her socks back up after examining her feet.     Results for orders placed or performed during the hospital encounter of 12/10/18 (from the past 48 hour(s))  Comprehensive metabolic panel     Status: Abnormal   Collection Time: 12/10/18  5:22 PM  Result Value Ref Range   Sodium 156 (H) 135 - 145 mmol/L   Potassium 3.7 3.5 - 5.1 mmol/L   Chloride 127 (H) 98 - 111 mmol/L   CO2 18 (L) 22 - 32 mmol/L   Glucose, Bld 93 70 - 99 mg/dL   BUN 46 (H) 8 - 23 mg/dL   Creatinine, Ser 2.90 (H) 0.44 - 1.00 mg/dL   Calcium 7.7 (L) 8.9 - 10.3 mg/dL   Total Protein 6.2 (L) 6.5 - 8.1 g/dL   Albumin 1.6 (L) 3.5 - 5.0 g/dL   AST 47 (H) 15 - 41 U/L   ALT 18 0 - 44 U/L   Alkaline Phosphatase 397 (H) 38 - 126 U/L   Total Bilirubin 1.4 (H) 0.3 - 1.2 mg/dL   GFR calc non Af Amer 15 (L) >60 mL/min   GFR calc Af Amer 17 (L) >60 mL/min   Anion gap  11 5 - 15    Comment: Performed at [facility], [address] [place], [place] [address]  Lipase, blood     Status: None   Collection Time: 12/10/18  5:22 PM  Result Value Ref Range   Lipase 45 11 - 51 U/L    Comment: Performed at [facility], [address], [place], [place] [address]  CBC with Differential     Status: Abnormal   Collection Time: 12/10/18  5:22 PM  Result Value Ref Range   WBC 6.0 4.0 - 10.5 K/uL   RBC 4.27 3.87 - 5.11 MIL/uL   Hemoglobin 10.6 (L) 12.0 - 15.0 g/dL   HCT 36.9 36.0 - 46.0 %   MCV 86.4 80.0 - 100.0 fL   MCH 24.8 (L) 26.0 - 34.0 pg   MCHC 28.7 (L) 30.0 - 36.0 g/dL   RDW 19.9 (H) 11.5 - 15.5 %   Platelets 78 (L) 150 - 400 K/uL    Comment: PLATELET COUNT CONFIRMED BY SMEAR SPECIMEN CHECKED FOR CLOTS Immature Platelet Fraction may be clinically indicated, consider ordering this additional test [id number]    nRBC 1.3 (H) 0.0 - 0.2 %   Neutrophils Relative % 58 %   Neutro Abs 3.5 1.7 - 7.7 K/uL   Lymphocytes Relative 36 %   Lymphs Abs 2.1 0.7 - 4.0 K/uL   Monocytes Relative 5 %   Monocytes Absolute  0.3 0.1 - 1.0 K/uL   Eosinophils Relative 0 %   Eosinophils Absolute 0.0 0.0 - 0.5 K/uL   Basophils Relative 0 %   Basophils Absolute 0.0 0.0 - 0.1 K/uL   Immature Granulocytes 1 %   Abs Immature Granulocytes 0.06 0.00 - 0.07 K/uL    Comment: Performed at [facility], [address], [place], [place] [address]  Lactic acid, plasma     Status: None   Collection Time: 12/10/18  5:22 PM  Result Value Ref Range   Lactic Acid, Venous 1.9 0.5 - 1.9 mmol/L    Comment: Performed at [facility], [address], [place], [place] [address]  SARS Coronavirus 2 [facility] order, Performed in [facility] hospital lab) Nasopharyngeal Nasopharyngeal Swab     Status: None   Collection Time: 12/10/18  9:05 PM   Specimen: Nasopharyngeal Swab  Result Value Ref Range   SARS Coronavirus 2 NEGATIVE NEGATIVE    Comment: (NOTE) If result is NEGATIVE SARS-CoV-2 target  nucleic acids are NOT DETECTED. The SARS-CoV-2 RNA is generally detectable in upper and lower  respiratory specimens during the acute phase of infection. The lowest  concentration of SARS-CoV-2 viral copies this assay can detect is 250  copies / mL. A negative result does not preclude SARS-CoV-2 infection  and should not be used as the sole basis for treatment or other  patient management decisions.  A negative result may occur with  improper specimen collection / handling, submission of specimen other  than nasopharyngeal swab, presence of viral mutation(s) within the  areas targeted by this assay, and inadequate number of viral copies  (<250 copies / mL). A negative result must be combined with clinical  observations, patient history, and epidemiological information. If result is POSITIVE SARS-CoV-2 target nucleic acids are DETECTED. The SARS-CoV-2 RNA is generally detectable in upper and lower  respiratory specimens dur ing the acute phase of infection.  Positive  results are indicative of active infection with SARS-CoV-2.  Clinical  correlation with patient history and other diagnostic information is  necessary to determine patient infection status.  Positive results do  not rule out bacterial infection or co-infection with other viruses. If result is PRESUMPTIVE POSTIVE SARS-CoV-2 nucleic acids MAY BE PRESENT.   A presumptive positive result was obtained on the submitted specimen  and confirmed on repeat testing.  While 2019 novel coronavirus  (SARS-CoV-2) nucleic acids may be present in the submitted sample  additional confirmatory testing may be necessary for epidemiological  and / or clinical management purposes  to differentiate between  SARS-CoV-2 and other Sarbecovirus currently known to infect humans.  If clinically indicated additional testing with an alternate test  methodology [id number]) is advised. The SARS-CoV-2 RNA is generally  detectable in upper and lower  respiratory sp ecimens during the acute  phase of infection. The expected result is Negative. Fact Sheet for Patients:  StrictlyIdeas.no Fact Sheet for Healthcare Providers: BankingDealers.co.za This test is not yet approved or cleared by the [place] FDA and has been authorized for detection and/or diagnosis of SARS-CoV-2 by FDA under an Emergency Use Authorization (EUA).  This EUA will remain in effect (meaning this test can be used) for the duration of the COVID-19 declaration under Section 564(b)(1) of the Act, 21 U.S.C. section 360bbb-3(b)(1), unless the authorization is terminated or revoked sooner. Performed at [facility], [address], [place], [place] [address]   Hemoglobin A1c     Status: Abnormal   Collection Time: 12/11/18  2:23  AM  Result Value Ref Range   Hgb A1c MFr Bld 4.7 (L) 4.8 - 5.6 %    Comment: (NOTE) Pre diabetes:          5.7%-6.4% Diabetes:              >6.4% Glycemic control for   <7.0% adults with diabetes    Mean Plasma Glucose 88.19 mg/dL    Comment: Performed at [facility] [address] [place], [place] [address]  CBC     Status: Abnormal   Collection Time: 12/11/18  2:23 AM  Result Value Ref Range   WBC 5.5 4.0 - 10.5 K/uL   RBC 4.24 3.87 - 5.11 MIL/uL   Hemoglobin 10.8 (L) 12.0 - 15.0 g/dL   HCT 35.7 (L) 36.0 - 46.0 %   MCV 84.2 80.0 - 100.0 fL   MCH 25.5 (L) 26.0 - 34.0 pg   MCHC 30.3 30.0 - 36.0 g/dL   RDW 20.0 (H) 11.5 - 15.5 %   Platelets 74 (L) 150 - 400 K/uL    Comment: REPEATED TO VERIFY PLATELET COUNT CONFIRMED BY SMEAR Immature Platelet Fraction may be clinically indicated, consider ordering this additional test [id number]    nRBC 2.7 (H) 0.0 - 0.2 %    Comment: Performed at [facility], [place] [address], [place], [place] [address]  Comprehensive metabolic panel     Status: Abnormal   Collection Time: 12/11/18  2:23 AM  Result Value Ref Range   Sodium 159  (H) 135 - 145 mmol/L   Potassium 4.3 3.5 - 5.1 mmol/L   Chloride 126 (H) 98 - 111 mmol/L   CO2 18 (L) 22 - 32 mmol/L   Glucose, Bld 70 70 - 99 mg/dL   BUN 45 (H) 8 - 23 mg/dL   Creatinine, Ser 3.20 (H) 0.44 - 1.00 mg/dL   Calcium 7.7 (L) 8.9 - 10.3 mg/dL   Total Protein 6.3 (L) 6.5 - 8.1 g/dL   Albumin 1.4 (L) 3.5 - 5.0 g/dL   AST 45 (H) 15 - 41 U/L   ALT 19 0 - 44 U/L   Alkaline Phosphatase 379 (H) 38 - 126 U/L   Total Bilirubin 1.4 (H) 0.3 - 1.2 mg/dL   GFR calc non Af Amer 13 (L) >60 mL/min   GFR calc Af Amer 15 (L) >60 mL/min   Anion gap 15 5 - 15    Comment: Performed at [facility], [place] [address], [place], [place] [address]  Surgical pcr screen     Status: Abnormal   Collection Time: 12/11/18  2:52 AM  Result Value Ref Range   MRSA, PCR POSITIVE (A) NEGATIVE    Comment: RESULT CALLED TO, READ BACK BY AND VERIFIED WITH: IRISH,N RN 12/11/2018 AT 0406 SKEEN,P    Staphylococcus aureus POSITIVE (A) NEGATIVE    Comment: (NOTE) The Xpert SA Assay (FDA approved for NASAL specimens in patients 76 years of age and older), is one component of a comprehensive surveillance program. It is not intended to diagnose infection nor to guide or monitor treatment. Performed at [facility], [place] [address] [place], [place] [address]   Glucose, capillary     Status: Abnormal   Collection Time: 12/11/18  4:31 AM  Result Value Ref Range   Glucose-Capillary 58 (L) 70 - 99 mg/dL  Glucose, capillary     Status: Abnormal   Collection Time: 12/11/18  5:19 AM  Result Value Ref Range  Glucose-Capillary 135 (H) 70 - 99 mg/dL  Glucose, capillary     Status: Abnormal   Collection Time: 12/11/18  8:33 AM  Result Value Ref Range   Glucose-Capillary 69 (L) 70 - 99 mg/dL  Lactic acid, plasma     Status: None   Collection Time: 12/11/18 11:22 AM  Result Value Ref Range   Lactic Acid, Venous 1.7 0.5 - 1.9 mmol/L    Comment: Performed at [facility] [address]  [place], [place] [address]  Glucose, capillary     Status: None   Collection Time: 12/11/18 12:10 PM  Result Value Ref Range   Glucose-Capillary 96 70 - 99 mg/dL  Glucose, capillary     Status: Abnormal   Collection Time: 12/11/18  4:58 PM  Result Value Ref Range   Glucose-Capillary 129 (H) 70 - 99 mg/dL  Glucose, capillary     Status: Abnormal   Collection Time: 12/11/18  8:27 PM  Result Value Ref Range   Glucose-Capillary 107 (H) 70 - 99 mg/dL  Glucose, capillary     Status: None   Collection Time: 12/11/18 11:19 PM  Result Value Ref Range   Glucose-Capillary 89 70 - 99 mg/dL  CBC with Differential/Platelet     Status: Abnormal   Collection Time: 12/12/18  2:05 AM  Result Value Ref Range   WBC 4.2 4.0 - 10.5 K/uL   RBC 3.56 (L) 3.87 - 5.11 MIL/uL   Hemoglobin 8.9 (L) 12.0 - 15.0 g/dL   HCT 29.7 (L) 36.0 - 46.0 %   MCV 83.4 80.0 - 100.0 fL   MCH 25.0 (L) 26.0 - 34.0 pg   MCHC 30.0 30.0 - 36.0 g/dL   RDW 19.4 (H) 11.5 - 15.5 %   Platelets 60 (L) 150 - 400 K/uL    Comment: REPEATED TO VERIFY SPECIMEN CHECKED FOR CLOTS Immature Platelet Fraction may be clinically indicated, consider ordering this additional test [id number] CONSISTENT WITH PREVIOUS RESULT    nRBC 2.8 (H) 0.0 - 0.2 %   Neutrophils Relative % 41 %   Neutro Abs 1.7 1.7 - 7.7 K/uL   Lymphocytes Relative 43 %   Lymphs Abs 1.9 0.7 - 4.0 K/uL   Monocytes Relative 10 %   Monocytes Absolute 0.4 0.1 - 1.0 K/uL   Eosinophils Relative 2 %   Eosinophils Absolute 0.1 0.0 - 0.5 K/uL   Basophils Relative 1 %   Basophils Absolute 0.0 0.0 - 0.1 K/uL   Immature Granulocytes 3 %   Abs Immature Granulocytes 0.14 (H) 0.00 - 0.07 K/uL    Comment: Performed at [facility], [place] [address], [place], [place] [address]  Comprehensive metabolic panel     Status: Abnormal   Collection Time: 12/12/18  2:05 AM  Result Value Ref Range   Sodium 147 (H) 135 - 145 mmol/L    Comment: DELTA CHECK NOTED   Potassium 3.1 (L) 3.5 - 5.1  mmol/L    Comment: DELTA CHECK NOTED   Chloride 120 (H) 98 - 111 mmol/L   CO2 18 (L) 22 - 32 mmol/L   Glucose, Bld 110 (H) 70 - 99 mg/dL   BUN 38 (H) 8 - 23 mg/dL   Creatinine, Ser 2.94 (H) 0.44 - 1.00 mg/dL   Calcium 7.0 (L) 8.9 - 10.3 mg/dL   Total Protein 5.1 (L) 6.5 - 8.1 g/dL   Albumin 1.1 (L) 3.5 - 5.0 g/dL   AST 34 15 - 41 U/L   ALT 16  0 - 44 U/L   Alkaline Phosphatase 272 (H) 38 - 126 U/L   Total Bilirubin 1.0 0.3 - 1.2 mg/dL   GFR calc non Af Amer 14 (L) >60 mL/min   GFR calc Af Amer 17 (L) >60 mL/min   Anion gap 9 5 - 15    Comment: Performed at [facility] [address], [place], [place] [address]  Glucose, capillary     Status: Abnormal   Collection Time: 12/12/18  5:55 AM  Result Value Ref Range   Glucose-Capillary 49 (L) 70 - 99 mg/dL  Glucose, capillary     Status: Abnormal   Collection Time: 12/12/18  6:43 AM  Result Value Ref Range   Glucose-Capillary 49 (L) 70 - 99 mg/dL  Glucose, capillary     Status: Abnormal   Collection Time: 12/12/18  6:50 AM  Result Value Ref Range   Glucose-Capillary 61 (L) 70 - 99 mg/dL  Glucose, capillary     Status: None   Collection Time: 12/12/18  7:23 AM  Result Value Ref Range   Glucose-Capillary 78 70 - 99 mg/dL   Ct Abdomen Pelvis Wo Contrast  Result Date: 12/10/2018 CLINICAL DATA:  78 year old who underwent bowel resection for ischemia in April, 2020, presenting now with possible wound infection as there is drainage from the INFERIOR aspect of the incision. Intravenous contrast was not administered due to the patient's renal insufficiency with creatinine of 1.89 and estimated GFR of 29. EXAM: CT ABDOMEN AND PELVIS WITHOUT CONTRAST TECHNIQUE: Multidetector CT imaging of the abdomen and pelvis was performed following the standard protocol without IV contrast. COMPARISON:  09/09/2018 and earlier. FINDINGS: Lower chest: Respiratory motion blurred images of the lung bases. Mild scarring in the lower lobes. Visualized lung  bases otherwise clear. Heart markedly enlarged. Biventricular pacemaker with the lead tips in the RIGHT atrial appendage, RV apex and coronary vein. Hepatobiliary: Normal unenhanced appearance of the liver. Multiple small gallstones in the mildly distended gallbladder, with gallstones also present in what I believe is the cystic duct. No pericholecystic edema/inflammation. No biliary ductal dilation. Pancreas: Mildly atrophic without evidence of mass or peripancreatic inflammation. Spleen: Normal unenhanced appearance. Adrenals/Urinary Tract: Normal appearing adrenal glands. Benign cortical cyst arising from the LOWER pole of the RIGHT kidney. Within the limits of the unenhanced technique, no significant focal parenchymal abnormality involving either kidney. No hydronephrosis. No opaque urinary tract calculi. Normal appearing relatively decompressed urinary bladder. Stomach/Bowel: Normal appearing gas-filled stomach. Clumped small-bowel loops low in the ANTERIOR pelvis, with a fistulous tract of gas extending from a small bowel loop to the skin at the INFERIOR margin of the incisional wound. Small bowel otherwise normal in appearance. Entire colon relatively decompressed. Sigmoid colon diverticulosis without evidence of acute diverticulitis. Vascular/Lymphatic: Severe aorto-iliofemoral atherosclerosis without evidence of aneurysm. Visceral artery atherosclerosis. No pathologic lymphadenopathy. Reproductive: Normal appearing uterus. Partially calcified LEFT adnexal mass as noted on the recent prior CTs. Other: None. Musculoskeletal: Osseous demineralization. Facet degenerative changes throughout the lumbar spine. No acute findings. IMPRESSION: 1. Fistulous communication between a small bowel loop in the LOWER pelvis anteriorly and the skin of the LOWER portion of the incisional wound. 2. Cholelithiasis. Gallstones in what I believe is the cystic duct with mild gallbladder distension, query cystic duct obstruction. 3.  Partially calcified LEFT adnexal mass, identified on multiple recent CTs dating back to April, 2020 and unchanged. 4.  Aortic Atherosclerosis (ICD10-170.0) Electronically Signed   By: Evangeline Dakin M.D.   On: 12/10/2018 22:20  US Abdomen Complete  Result Date: 12/11/2018 CLINICAL DATA:  78 year old female with abdominal pain. Cholelithiasis and choledocholithiasis on recent CT. EXAM: ABDOMEN ULTRASOUND COMPLETE COMPARISON:  12/11/2018 CT FINDINGS: Gallbladder: Cholelithiasis identified, the largest measuring 6 mm. The gallbladder is distended with sludge. No gallbladder wall thickening, sonographic Murphy sign or pericholecystic fluid. Common bile duct: Diameter: 6 mm. No intrahepatic or extrahepatic biliary dilatation. Known choledocholithiasis not well visualized on this exam. Liver: No focal hepatic abnormalities are noted. Slightly heterogeneous hepatic echotexture identified. Portal vein is patent on color Doppler imaging with normal direction of blood flow towards the liver. IVC: No abnormality visualized. Pancreas: Visualized portion unremarkable. Spleen: Size and appearance within normal limits. Right Kidney: Length: 8.7 cm. A 2.3 cm LOWER pole cyst is present. Echogenicity within normal limits. No solid mass or hydronephrosis visualized. Left Kidney: Length: 9.2 cm. Echogenicity within normal limits. No mass or hydronephrosis visualized. Abdominal aorta: Aortic atherosclerotic plaque noted. No evidence of aortic aneurysm. Other findings: None. IMPRESSION: 1. Distended gallbladder with cholelithiasis and sludge. No evidence of gallbladder wall thickening or sonographic Murphy sign to suggest acute cholecystitis at this time. 2. No biliary dilatation. Known choledocholithiasis not well visualized on this examination. 3.  Aortic Atherosclerosis (ICD10-I70.0). Electronically Signed   By: Margarette Canada M.D.   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 Autumn Messing III  FEN: IV fluids/n.p.o. ID: Zosyn 8/20 DVT: SCDs added Follow-up: To be determined   Plan: I get a pre-albumin and we will get a PICC line in and start her on TNA.  I will ask wound care to see and place a Eakin's pouch over the site.  Right now it appears to be a low output fistula.  She was placed on antibiotics for possible cholecystitis/choledocholithiasis based on the CT.  Exam and ultrasound do not suggest she has any choledocholithiasis or cholecystitis.       [facility] Pager:  [phone]    12/12/2018 9:26 AM

## 2018-12-12 NOTE — Progress Notes (Signed)
Midline placed in the right upper arm/brachial vein with a 20gx8cm catheter. Vein was a little difficult to access. Great blood return.

## 2018-12-12 NOTE — Progress Notes (Signed)
Writer and Hoover Browns RN @ bedside for PIV placement. Present for second assess. Murvin Natal RN VAST attempted PIV x2  With US guidance-unsuccessful. PIV attempted X2 with US guidance- also unsuccessful. Primary care RN updated. Patient not a candidate for midline or PICC placement secondary to renal status. Appreciate consult, VAST unable to provide patient with peripheral access at this time.

## 2018-12-12 NOTE — Progress Notes (Signed)
PROGRESS NOTE  [name] KPT:[id number] DOB: 1940-08-14 DOA: 12/10/2018 PCP: [name], MD  Brief History   [name]  is a 78 y.o. female, with history of anemia of chronic disease, ASCVD, AICD placement, history of breast cancer, chronic bronchitis, stage III CKD, systolic heart failure, hyperlipidemia, GERD, gout, hypertension, rheumatoid arthritis who recently had expiratory laparotomy with lysis of adhesions, bowel resection for ischemic bowel in May 2020.  She had large abdominal wound, which is healing by secondary intention.  As per family they noted feces coming from the wound.  No abdominal pain or fever.  Patient was brought to the hospital for further evaluation.  Patient is a poor historian.  History obtained from ED records.  CT of the abdomen pelvis was done, no results are available in epic due to technical issues.  ED physician spoke to radiologist verbally about the results.    As per ED physician, there is an enterocutaneous fistula and inflammation around the gallbladder.  No acute cholecystitis.  General surgery, Dr. Rosendo Gros was consulted, he recommended no immediate surgery at this time.  Recommended patient to transfer to [facility] and surgery have been consulted again and have seen patient this morning. They have recommended Eakins pouch, wound care, and TPN. PICC line has been ordered.  Consultants  . General Surgery  Procedures  . PICC line placement.  Antibiotics   Anti-infectives (From admission, onward)   Start     Dose/Rate Route Frequency Ordered Stop   12/11/18 1000  piperacillin-tazobactam (ZOSYN) IVPB 3.375 g  Status:  Discontinued     3.375 g 12.5 mL/hr over 240 Minutes Intravenous Every 12 hours 12/10/18 2220 12/11/18 0522   12/11/18 1000  piperacillin-tazobactam (ZOSYN) IVPB 3.375 g  Status:  Discontinued     3.375 g 12.5 mL/hr over 240 Minutes Intravenous Every 12 hours 12/11/18 0524 12/12/18 1122   12/11/18 0800  piperacillin-tazobactam  (ZOSYN) IVPB 2.25 g  Status:  Discontinued     2.25 g 100 mL/hr over 30 Minutes Intravenous Every 6 hours 12/11/18 0522 12/11/18 0524   12/10/18 2215  piperacillin-tazobactam (ZOSYN) IVPB 3.375 g     3.375 g 100 mL/hr over 30 Minutes Intravenous  Once 12/10/18 2211 12/10/18 2256      Subjective  The patient is lying quietly. No new complaints.  Objective   Vitals:  Vitals:   12/11/18 1900 12/12/18 0608  BP: (!) 106/57 (!) 98/50  Pulse: 70 70  Resp: 16 16  Temp: 97.9 F (36.6 C) (!) 97.5 F (36.4 C)  SpO2: 100% 100%    Exam:  Constitutional:  . The patient is awake, alert, and oriented x 3. No acute distress. Respiratory:  . No increased work of breathing. . No wheezes, rales, or rhonchi. . No tactile fremitus. Cardiovascular:  . Regular rate and rhythm. . No murmurs, ectopy, or gallups. . No lateral PMI. No thrills. Abdomen:  . Abdomen is soft, non-tender, non-distended. . No hernias, masses, or organomegaly. . Hypoactive bowel sounds . Eakins pouch in place. Musculoskeletal:  . No cyanosis, clubbing, or edema Skin:  . No rashes, lesions, ulcers . palpation of skin: no induration or nodules Neurologic:  . CN 2-12 intact . Sensation all 4 extremities intact Psychiatric:  . Mental status o Mood, affect appropriate o Orientation to person, place, time  . judgment and insight appear intact     I have personally reviewed the following:   Today's Data  . CMP, CBC, Hemoglobin A1c, Vitals. Prealbumin  Micro Data  . Nares MSSA and MRSA positive.  Imaging  . CT abdomen: Entero-cutaneous fistula.  Scheduled Meds: . amiodarone  200 mg Oral Daily  . carvedilol  3.125 mg Oral BID WC  . Chlorhexidine Gluconate Cloth  6 each Topical Daily  . glucagon (human recombinant)      . heparin  5,000 Units Subcutaneous Q8H  . insulin aspart  0-9 Units Subcutaneous Q4H  . mirtazapine  15 mg Oral QPM  . mupirocin ointment  1 application Nasal BID  . pantoprazole  40  mg Oral Daily  . saccharomyces boulardii  250 mg Oral BID   Continuous Infusions: . dextrose 75 mL/hr at 12/12/18 1045  . dextrose    . magnesium sulfate bolus IVPB    . sodium phosphate  Dextrose 5% IVPB    . TPN ADULT (ION)      Active Problems:   AKI (acute kidney injury) ([place])   Unstageable pressure ulcer of sacral region ([place])   LOS: 2 days   A & P  Acute kidney injury-patient presented with acute kidney injury with creatinine 2.94, her baseline creatinine as of November 12, 2018 was 1.89.  Started on 0.45% normal saline at 100 mL/h as patient also has hypernatremia with sodium 156.  Follow creatinine, electrolytes, and volume status carefully.  Hypernatremia-appears chronic, patient sodium was 152 on 11/12/2018.  Started on 0.45% normal saline as above.  147 this am  Hypokalemia: Supplement. Monitor.  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  No immediate surgical intervention required.  General surgery (Dr. Rosendo Gros) will see patient today.  Cholecystitis-CT scan showed inflammation around gallbladder.  Will obtain abdominal ultrasound in a.m.  Will initiate empiric antibiotics IV Zosyn.  History of wide-complex tachycardia/CAD-continue amiodarone, Coreg.  Diabetes mellitus/hypoglycemia: IV D10 with IV 1//2 NS. Monitor. HbA1c 4.7.  I have seen and examined this patient myself. I have spent 35 minutes in her evaluation and care.  DVT prophylaxis: Heparin Code Status: Full Code Family Communication: No family present. Disposition Plan: tbd.    Braydn Carneiro, DO [facility] Direct contact: see [email or web address]  7PM-7AM contact night coverage as above 12/12/2018, 5:08 PM  LOS: 1 day

## 2018-12-12 NOTE — Progress Notes (Signed)
Hypoglycemic Event  CBG: 49  Treatment: Given glucagon 1 mg IM @0611   Symptoms: asymptomatic  Follow-up CBG: Time: 0643 CBG Result: 49  Possible Reasons for Event: IV loss of access, interrupted D10 infusion  Comments/MD notified: Dr. Hilbert Bible notified. IV team attempted PIVX2 with US guidance, unsuccessful and pt not candidate for midline or PICC placement secondary to renal status. VAST unable to provide pt with PIV access at this time. Will continue to monitor    [employer]

## 2018-12-12 NOTE — Progress Notes (Signed)
Gas NOTE   Pharmacy Consult for TPN Indication: EC Fistula  Patient Measurements: Height: _0  (160 cm) Weight: 148 lb (67.1 kg) IBW/kg (Calculated) : 52.4 TPN AdjBW (KG): 56.1 Body mass index is 26.22 kg/m.  Assessment: 78 year old female s/p exploratory laparotomy with lysis of adhesions and bowel resection for ischemic bowel in May 2000. Patient required TPN during that admission but was weaned off to enteral diet. Re-admitted from home on 12/10/18 with feces draining from wound indicating EC fistula and AKI. Patient also with inflammation around her gallbladder. Patient has had ~10.5% weight gain over the past 3 months. Pharmacy consulted for TPN.   GI: EC fistula - Eakin pouch placed. Poor candidate for reoperative surgery.  Endo: Hypoglycemia with CBGs 49 to 97. Currently on D10 at 75 mL/hr Insulin requirements in the past 24 hours: 1 unit (SSI now discontinued) Lytes: Na elevated (159>>147), Cl 120. K 3.1- 36mq given. No available Mag and Phos.  Renal: AKI on CKD, SCR 3.2 >>2.94, BUN 54>>38.  Pulm: RA Cards: Hypotensive-MAP 66, HR 70.  Hepatobil: AST/ALT/Tbili down to normal limits. Alk Phos elevated but trending down. Alb 1.1 Neuro: Pain 0 to 4. NuDESC 1.  ID: Zosyn 8/20 for possible intra-abdominal infection. WBC wnl.   TPN Access: Pending TPN start date: 12/12/18 Nutritional Goals (per RD recommendation on 8/20): KCal: 1800-2000 Protein: 90-105 grams Fluid: >1.8L  Goal TPN rate is 70 ml/hr (provides 100%)  Current Nutrition:  NPO  TPN  Plan:  Initiate TPN at 323mhr if access obtained. This TPN provides 43.2 g of protein, 115.2 g of dextrose, and 23 g of lipids which provides 795 kCals per day, meeting 44% of patient needs Electrolytes in TPN: No Na, Mag, and Phos for now. Lower K than standard. Max acetate.  Add MVI to TPN Trace elements MWF only.  Continue sensitive SSI and adjust as needed Change D10 to D5 at  40 mL/hr  at 1800 when TPN hangs.  Monitor TPN labs Will check Mag and Phos now and replace outside TPN with AKI for now.  F/U plans   JeSloan LeiterPharmD, BCPS, BCCCP Clinical Pharmacist Please refer to [facility] 12/12/2018,10:48 AM

## 2018-12-12 NOTE — Consult Note (Signed)
   [facility] Inpatient Consult   12/12/2018  MIARA EMMINGER 1940/09/19 [id number]   Patient is noted as active with [facility] for chronic disease management services.  Patient has had difficulty maintaining contact with the patient in the [place] with the referral from her health plan for [facility] Pharmacist and Social worker after her return home from a facility.  Follow up was initially with patient's husband. ..   Chart reviewed for History and Physical on 12/10/2018 as follows:  Lonie Rummell  is a 78 y.o. female, with history of anemia of chronic disease, ASCVD, AICD placement, history of breast cancer, chronic bronchitis, stage III CKD, systolic heart failure, hyperlipidemia, GERD, gout, hypertension, rheumatoid arthritis who recently had expiratory laparotomy with lysis of adhesions, bowel resection for ischemic bowel in May 2020.  She had large abdominal wound, which is healing by secondary intention.  As per family they noted feces coming from the wound.  No abdominal pain or fever.  Patient was brought to the hospital for further evaluation.  Patient is a poor historian.  History obtained from ED records.  Follow up Plan: Check for patient's progress for disposition and needs and follow up with Inpatient [facility] team member to make aware that [facility] following.   Of note, [facility] Care Management services does not replace or interfere with any services that are needed or arranged by inpatient [facility] care management team.  For additional questions or referrals please contact:  [name], RN BSN [facility] Liaison  [phone] business mobile phone Toll free office [phone]  Fax number: [phone] [email or web address]

## 2018-12-12 NOTE — Progress Notes (Signed)
Hypoglycemic Event  CBG: 61  Treatment: Given glucagon 1 mg IM @ 0655  Symptoms: ASYMPTOMATIC  Follow-up CBG: Time:0723 CBG Result: 78  Possible Reasons for Event:  IV loss of access, interrupted D10 infusion  Comments/MD notified: Dr. Hilbert Bible notified. IV team attempted PIVX2 with US guidance, unsuccessful and pt not candidate for midline or PICC placement secondary to renal status. VAST unable to provide pt with PIV access at this time.      Corinna Lines

## 2018-12-12 NOTE — Progress Notes (Signed)
Inpatient Diabetes Program Recommendations  AACE/ADA: New Consensus Statement on Inpatient Glycemic Control (2015)  Target Ranges:  Prepandial:   less than 140 mg/dL      Peak postprandial:   less than 180 mg/dL (1-2 hours)      Critically ill patients:  140 - 180 mg/dL   Lab Results  Component Value Date   GLUCAP 78 12/12/2018   HGBA1C 4.7 (L) 12/11/2018    Review of Glycemic Control Results for [name], [name] (MRN [id number]) as of 12/12/2018 10:41  Ref. Range 12/11/2018 23:19 12/12/2018 05:55 12/12/2018 06:43 12/12/2018 06:50 12/12/2018 07:23  Glucose-Capillary Latest Ref Range: 70 - 99 mg/dL 89 49 (L) 49 (L) 61 (L) 78    Inpatient Diabetes Program Recommendations:   Continue CBGs but D/C Novolog correction scale. Patient had hypoglycemia last pm after receiving 1 unit Novolog @ 1825.  Thank you, Nani Gasser. [name], RN, MSN, CDE  Diabetes Coordinator Inpatient Glycemic Control Team Team Pager [phone] (8am-5pm) 12/12/2018 10:43 AM

## 2018-12-12 NOTE — Consult Note (Signed)
[facility] Nurse wound consult note Patient receiving care in [facility] 6N20. Reason for Consult: EC fistula management with Eakin pouch Wound type: healing surgical midline abdominal incision with EC fistula at the inferior border Measurement: 7.6 cm x 4 cm x 2.5 cm Wound bed: 100% pink, clean Drainage (amount, consistency, odor) none at the time of my visit Periwound: intact Dressing procedure/placement/frequency: A small Eakin pouch was cut and placed, using the portion of the Eakin pouch that was cut out to protect the periwound skin.  Without leakage, this can stay in place up to 10 days. Order and place at the bedside 2 small Eakin pouches, Kellie Simmering [phone] Monitor the wound area(s) for worsening of condition such as: Signs/symptoms of infection,  Increase in size,  Development of or worsening of odor, Development of pain, or increased pain at the affected locations.  Notify the medical team if any of these develop.  Val Riles, RN, MSN, CWOCN, CNS-BC, pager [phone]

## 2018-12-13 ENCOUNTER — Encounter (HOSPITAL_COMMUNITY): Payer: Self-pay | Admitting: Interventional Radiology

## 2018-12-13 DIAGNOSIS — E876 Hypokalemia: Secondary | ICD-10-CM

## 2018-12-13 LAB — BASIC METABOLIC PANEL
Anion gap: 7 (ref 5–15)
BUN: 32 mg/dL — ABNORMAL HIGH (ref 8–23)
CO2: 18 mmol/L — ABNORMAL LOW (ref 22–32)
Calcium: 7 mg/dL — ABNORMAL LOW (ref 8.9–10.3)
Chloride: 113 mmol/L — ABNORMAL HIGH (ref 98–111)
Creatinine, Ser: 1.89 mg/dL — ABNORMAL HIGH (ref 0.44–1.00)
GFR calc Af Amer: 29 mL/min — ABNORMAL LOW (ref >60)
GFR calc non Af Amer: 25 mL/min — ABNORMAL LOW (ref >60)
Glucose, Bld: 300 mg/dL — ABNORMAL HIGH (ref 70–99)
Potassium: 4.3 mmol/L (ref 3.5–5.1)
Sodium: 138 mmol/L (ref 135–145)

## 2018-12-13 LAB — DIFFERENTIAL
Abs Immature Granulocytes: 0.08 10*3/uL — ABNORMAL HIGH (ref 0.00–0.07)
Basophils Absolute: 0 10*3/uL (ref 0.0–0.1)
Basophils Relative: 1 %
Eosinophils Absolute: 0.1 10*3/uL (ref 0.0–0.5)
Eosinophils Relative: 3 %
Immature Granulocytes: 2 %
Lymphocytes Relative: 44 %
Lymphs Abs: 1.9 10*3/uL (ref 0.7–4.0)
Monocytes Absolute: 0.2 10*3/uL (ref 0.1–1.0)
Monocytes Relative: 5 %
Neutro Abs: 2 10*3/uL (ref 1.7–7.7)
Neutrophils Relative %: 45 %

## 2018-12-13 LAB — GLUCOSE, CAPILLARY
Glucose-Capillary: 141 mg/dL — ABNORMAL HIGH (ref 70–99)
Glucose-Capillary: 118 mg/dL — ABNORMAL HIGH (ref 70–99)
Glucose-Capillary: 140 mg/dL — ABNORMAL HIGH (ref 70–99)
Glucose-Capillary: 101 mg/dL — ABNORMAL HIGH (ref 70–99)
Glucose-Capillary: 103 mg/dL — ABNORMAL HIGH (ref 70–99)
Glucose-Capillary: 146 mg/dL — ABNORMAL HIGH (ref 70–99)

## 2018-12-13 LAB — COMPREHENSIVE METABOLIC PANEL
ALT: 15 U/L (ref 0–44)
AST: 30 U/L (ref 15–41)
Albumin: 1 g/dL — ABNORMAL LOW (ref 3.5–5.0)
Alkaline Phosphatase: 251 U/L — ABNORMAL HIGH (ref 38–126)
Anion gap: 7 (ref 5–15)
BUN: 32 mg/dL — ABNORMAL HIGH (ref 8–23)
CO2: 18 mmol/L — ABNORMAL LOW (ref 22–32)
Calcium: 6.8 mg/dL — ABNORMAL LOW (ref 8.9–10.3)
Chloride: 113 mmol/L — ABNORMAL HIGH (ref 98–111)
Creatinine, Ser: 2.28 mg/dL — ABNORMAL HIGH (ref 0.44–1.00)
GFR calc Af Amer: 23 mL/min — ABNORMAL LOW (ref >60)
GFR calc non Af Amer: 20 mL/min — ABNORMAL LOW (ref >60)
Glucose, Bld: 170 mg/dL — ABNORMAL HIGH (ref 70–99)
Potassium: 2.9 mmol/L — ABNORMAL LOW (ref 3.5–5.1)
Sodium: 138 mmol/L (ref 135–145)
Total Bilirubin: 0.8 mg/dL (ref 0.3–1.2)
Total Protein: 4.8 g/dL — ABNORMAL LOW (ref 6.5–8.1)

## 2018-12-13 LAB — PREALBUMIN: Prealbumin: 6.6 mg/dL — ABNORMAL LOW (ref 18–38)

## 2018-12-13 LAB — TRIGLYCERIDES: Triglycerides: 138 mg/dL (ref <150)

## 2018-12-13 LAB — PHOSPHORUS
Phosphorus: 1.4 mg/dL — ABNORMAL LOW (ref 2.5–4.6)
Phosphorus: 4.8 mg/dL — ABNORMAL HIGH (ref 2.5–4.6)

## 2018-12-13 LAB — CBC
HCT: 25.5 % — ABNORMAL LOW (ref 36.0–46.0)
Hemoglobin: 7.8 g/dL — ABNORMAL LOW (ref 12.0–15.0)
MCH: 25.2 pg — ABNORMAL LOW (ref 26.0–34.0)
MCHC: 30.6 g/dL (ref 30.0–36.0)
MCV: 82.5 fL (ref 80.0–100.0)
Platelets: 66 10*3/uL — ABNORMAL LOW (ref 150–400)
RBC: 3.09 MIL/uL — ABNORMAL LOW (ref 3.87–5.11)
RDW: 19 % — ABNORMAL HIGH (ref 11.5–15.5)
WBC: 4.4 10*3/uL (ref 4.0–10.5)
nRBC: 3.2 % — ABNORMAL HIGH (ref 0.0–0.2)

## 2018-12-13 LAB — MAGNESIUM
Magnesium: 1.8 mg/dL (ref 1.7–2.4)
Magnesium: 2.5 mg/dL — ABNORMAL HIGH (ref 1.7–2.4)

## 2018-12-13 MED ORDER — WHITE PETROLATUM EX OINT
TOPICAL_OINTMENT | CUTANEOUS | Status: AC
  Administered 2018-12-13: 11:00:00
  Filled 2018-12-13: qty 28.35

## 2018-12-13 MED ORDER — POTASSIUM CHLORIDE 10 MEQ/50ML IV SOLN
10.0000 meq | INTRAVENOUS | Status: AC
  Administered 2018-12-13 (×4): 10 meq via INTRAVENOUS
  Filled 2018-12-13 (×4): qty 50

## 2018-12-13 MED ORDER — MAGNESIUM SULFATE 2 GM/50ML IV SOLN
2.0000 g | Freq: Once | INTRAVENOUS | Status: AC
  Administered 2018-12-13: 2 g via INTRAVENOUS
  Filled 2018-12-13: qty 50

## 2018-12-13 MED ORDER — DEXTROSE-NACL 5-0.45 % IV SOLN
INTRAVENOUS | Status: AC
  Administered 2018-12-13 – 2018-12-14 (×2): via INTRAVENOUS

## 2018-12-13 MED ORDER — SODIUM PHOSPHATES 45 MMOLE/15ML IV SOLN
30.0000 mmol | Freq: Once | INTRAVENOUS | Status: AC
  Administered 2018-12-13: 10:00:00 30 mmol via INTRAVENOUS
  Filled 2018-12-13: qty 10

## 2018-12-13 MED ORDER — TRAVASOL 10 % IV SOLN
INTRAVENOUS | Status: AC
  Administered 2018-12-13: 18:00:00 via INTRAVENOUS
  Filled 2018-12-13: qty 432

## 2018-12-13 NOTE — Progress Notes (Signed)
Subjective/Chief Complaint: Cant hear well, no real complaints this am Got central line yesterday  Objective: Vital signs in last 24 hours: Temp:  [98.6 F (37 C)-98.9 F (37.2 C)] 98.6 F (37 C) (08/22 0356) Pulse Rate:  [79-88] 86 (08/22 0356) Resp:  [12-18] 18 (08/22 0356) BP: (111-121)/(53-70) 115/70 (08/22 0356) SpO2:  [98 %-100 %] 98 % (08/22 0356) Last BM Date: (UTA)  Intake/Output from previous day: 08/21 0701 - 08/22 0700 In: 315.6 [I.V.:315.6] Out: -  Intake/Output this shift: No intake/output data recorded.  abd eakins pouch on not any output this am, soft nontender   Lab Results:  Recent Labs    12/12/18 0205 12/13/18 0307  WBC 4.2 4.4  HGB 8.9* 7.8*  HCT 29.7* 25.5*  PLT 60* 66*   BMET Recent Labs    12/12/18 0205 12/13/18 0307  NA 147* 138  K 3.1* 2.9*  CL 120* 113*  CO2 18* 18*  GLUCOSE 110* 170*  BUN 38* 32*  CREATININE 2.94* 2.28*  CALCIUM 7.0* 6.8*   PT/INR No results for input(s): LABPROT, INR in the last 72 hours. ABG No results for input(s): PHART, HCO3 in the last 72 hours.  Invalid input(s): PCO2, PO2  Studies/Results: US Abdomen Complete  Result Date: 12/11/2018 CLINICAL DATA:  78 year old female with abdominal pain. Cholelithiasis and choledocholithiasis on recent CT. EXAM: ABDOMEN ULTRASOUND COMPLETE COMPARISON:  12/11/2018 CT FINDINGS: Gallbladder: Cholelithiasis identified, the largest measuring 6 mm. The gallbladder is distended with sludge. No gallbladder wall thickening, sonographic Murphy sign or pericholecystic fluid. Common bile duct: Diameter: 6 mm. No intrahepatic or extrahepatic biliary dilatation. Known choledocholithiasis not well visualized on this exam. Liver: No focal hepatic abnormalities are noted. Slightly heterogeneous hepatic echotexture identified. Portal vein is patent on color Doppler imaging with normal direction of blood flow towards the liver. IVC: No abnormality visualized. Pancreas: Visualized  portion unremarkable. Spleen: Size and appearance within normal limits. Right Kidney: Length: 8.7 cm. A 2.3 cm LOWER pole cyst is present. Echogenicity within normal limits. No solid mass or hydronephrosis visualized. Left Kidney: Length: 9.2 cm. Echogenicity within normal limits. No mass or hydronephrosis visualized. Abdominal aorta: Aortic atherosclerotic plaque noted. No evidence of aortic aneurysm. Other findings: None. IMPRESSION: 1. Distended gallbladder with cholelithiasis and sludge. No evidence of gallbladder wall thickening or sonographic Murphy sign to suggest acute cholecystitis at this time. 2. No biliary dilatation. Known choledocholithiasis not well visualized on this examination. 3.  Aortic Atherosclerosis (ICD10-I70.0). Electronically Signed   By: Margarette Canada M.D.   On: 12/11/2018 13:02   Ir Fluoro Guide Cv Line Right  Addendum Date: 12/13/2018   ADDENDUM REPORT: 12/13/2018 08:27 ADDENDUM: The impression should read: Placement of tunneled central venous catheter. Electronically Signed   By: Aletta Edouard M.D.   On: 12/13/2018 08:27   Result Date: 12/13/2018 CLINICAL DATA:  Enterocutaneous fistula and need for central venous catheter for parental nutrition. EXAM: TUNNELED CENTRAL VENOUS CATHETER PLACEMENT WITH ULTRASOUND AND FLUOROSCOPIC GUIDANCE FLUOROSCOPY TIME:  36 seconds.  1.0 mGy. PROCEDURE: The procedure, risks, benefits, and alternatives were explained to the patient. Questions regarding the procedure were encouraged and answered. The patient understands and consents to the procedure. A time-out was performed prior to initiating the procedure. Ultrasound was used to confirm patency of the right internal jugular vein. The right neck and chest were prepped with chlorhexidine in a sterile fashion, and a sterile drape was applied covering the operative field. Maximum barrier sterile technique with sterile gowns and gloves were  used for the procedure. Local anesthesia was provided with 1%  lidocaine. After creating a small venotomy incision, a 21 gauge needle was advanced into the right internal jugular vein under direct, real-time ultrasound guidance. Ultrasound image documentation was performed. After securing guidewire access a peel-away sheath was placed over a guide wire. Appropriate catheter length was estimated by guidewire. A 6 French dual-lumen power line was tunneled from the right upper chest wall to the venotomy incision. The catheter was cut to 23 cm based on guidewire measurement. The catheter was then placed through the sheath and the sheath removed. Final catheter positioning was confirmed and documented with a fluoroscopic spot image. The catheter was aspirated and flushed with saline. The catheter exit site was secured with Ethilon and Prolene retention sutures. The venotomy incision was closed with subcutaneous 4-0 Vicryl and Dermabond. COMPLICATIONS: None.  No pneumothorax. FINDINGS: After catheter placement, the tip lies at the cavoatrial junction. The catheter aspirates normally and is ready for immediate use. IMPRESSION: Placement of non-tunneled central venous catheter via the right internal jugular vein. The catheter tip lies at the cavoatrial junction. The catheter is ready for immediate use. Electronically Signed: By: Aletta Edouard M.D. On: 12/13/2018 08:00   Ir US Guide Vasc Access Right  Addendum Date: 12/13/2018   ADDENDUM REPORT: 12/13/2018 08:27 ADDENDUM: The impression should read: Placement of tunneled central venous catheter. Electronically Signed   By: Aletta Edouard M.D.   On: 12/13/2018 08:27   Result Date: 12/13/2018 CLINICAL DATA:  Enterocutaneous fistula and need for central venous catheter for parental nutrition. EXAM: TUNNELED CENTRAL VENOUS CATHETER PLACEMENT WITH ULTRASOUND AND FLUOROSCOPIC GUIDANCE FLUOROSCOPY TIME:  36 seconds.  1.0 mGy. PROCEDURE: The procedure, risks, benefits, and alternatives were explained to the patient. Questions regarding  the procedure were encouraged and answered. The patient understands and consents to the procedure. A time-out was performed prior to initiating the procedure. Ultrasound was used to confirm patency of the right internal jugular vein. The right neck and chest were prepped with chlorhexidine in a sterile fashion, and a sterile drape was applied covering the operative field. Maximum barrier sterile technique with sterile gowns and gloves were used for the procedure. Local anesthesia was provided with 1% lidocaine. After creating a small venotomy incision, a 21 gauge needle was advanced into the right internal jugular vein under direct, real-time ultrasound guidance. Ultrasound image documentation was performed. After securing guidewire access a peel-away sheath was placed over a guide wire. Appropriate catheter length was estimated by guidewire. A 6 French dual-lumen power line was tunneled from the right upper chest wall to the venotomy incision. The catheter was cut to 23 cm based on guidewire measurement. The catheter was then placed through the sheath and the sheath removed. Final catheter positioning was confirmed and documented with a fluoroscopic spot image. The catheter was aspirated and flushed with saline. The catheter exit site was secured with Ethilon and Prolene retention sutures. The venotomy incision was closed with subcutaneous 4-0 Vicryl and Dermabond. COMPLICATIONS: None.  No pneumothorax. FINDINGS: After catheter placement, the tip lies at the cavoatrial junction. The catheter aspirates normally and is ready for immediate use. IMPRESSION: Placement of non-tunneled central venous catheter via the right internal jugular vein. The catheter tip lies at the cavoatrial junction. The catheter is ready for immediate use. Electronically Signed: By: Aletta Edouard M.D. On: 12/13/2018 08:00   [place] Ekg Site Rite  Result Date: 12/12/2018 If [facility] image not attached, placement could not be confirmed  due to  current cardiac rhythm.  [place] Ekg Site Rite  Result Date: 12/12/2018 If [facility] image not attached, placement could not be confirmed due to current cardiac rhythm.   Anti-infectives: Anti-infectives (From admission, onward)   Start     Dose/Rate Route Frequency Ordered Stop   12/11/18 1000  piperacillin-tazobactam (ZOSYN) IVPB 3.375 g  Status:  Discontinued     3.375 g 12.5 mL/hr over 240 Minutes Intravenous Every 12 hours 12/10/18 2220 12/11/18 0522   12/11/18 1000  piperacillin-tazobactam (ZOSYN) IVPB 3.375 g  Status:  Discontinued     3.375 g 12.5 mL/hr over 240 Minutes Intravenous Every 12 hours 12/11/18 0524 12/12/18 1122   12/11/18 0800  piperacillin-tazobactam (ZOSYN) IVPB 2.25 g  Status:  Discontinued     2.25 g 100 mL/hr over 30 Minutes Intravenous Every 6 hours 12/11/18 0522 12/11/18 0524   12/10/18 2215  piperacillin-tazobactam (ZOSYN) IVPB 3.375 g     3.375 g 100 mL/hr over 30 Minutes Intravenous  Once 12/10/18 2211 12/10/18 2256      Assessment/Plan: EC fistula eakins pouch monitor output, likely repeat ct at some point this week, hopefully heals conservatively but would give her several months at least to see SBO with necrotic/dead small bowel S/p exploratory laparotomy with lysis of adhesions resection of small bowel and appendectomy 08/21/2018 Dr. Autumn Messing III FEN: IV fluids/n.p.o.tpn  ID: Lajean Silvius 8/20 DVT: SCDs Follow-up: To be determined    Rolm Bookbinder 12/13/2018

## 2018-12-13 NOTE — Progress Notes (Signed)
PROGRESS NOTE  MYLINDA BROOK BPZ:[id number] DOB: [birth year] DOA: 12/10/2018 PCP: [name], MD  Brief History   [name]  is a 78 y.o. female, with history of anemia of chronic disease, ASCVD, AICD placement, history of breast cancer, chronic bronchitis, stage III CKD, systolic heart failure, hyperlipidemia, GERD, gout, hypertension, rheumatoid arthritis who recently had expiratory laparotomy with lysis of adhesions, bowel resection for ischemic bowel in May 2020.  She had large abdominal wound, which is healing by secondary intention.  As per family they noted feces coming from the wound.  No abdominal pain or fever.  Patient was brought to the hospital for further evaluation.  Patient is a poor historian.  History obtained from ED records.  CT of the abdomen pelvis was done, no results are available in epic due to technical issues.  ED physician spoke to radiologist verbally about the results.    As per ED physician, there is an enterocutaneous fistula and inflammation around the gallbladder.  No acute cholecystitis.  General surgery, Dr. Rosendo Gros was consulted, he recommended no immediate surgery at this time.  Recommended patient to transfer to [facility] and surgery have been consulted again and have seen patient this morning. The patient now has an Eakins pouch, wound care, and TPN. Cental venous catheter has been placed.  Consultants  . General Surgery  Procedures  . PICC line placement.  Antibiotics   Anti-infectives (From admission, onward)   Start     Dose/Rate Route Frequency Ordered Stop   12/11/18 1000  piperacillin-tazobactam (ZOSYN) IVPB 3.375 g  Status:  Discontinued     3.375 g 12.5 mL/hr over 240 Minutes Intravenous Every 12 hours 12/10/18 2220 12/11/18 0522   12/11/18 1000  piperacillin-tazobactam (ZOSYN) IVPB 3.375 g  Status:  Discontinued     3.375 g 12.5 mL/hr over 240 Minutes Intravenous Every 12 hours 12/11/18 0524 12/12/18 1122   12/11/18 0800   piperacillin-tazobactam (ZOSYN) IVPB 2.25 g  Status:  Discontinued     2.25 g 100 mL/hr over 30 Minutes Intravenous Every 6 hours 12/11/18 0522 12/11/18 0524   12/10/18 2215  piperacillin-tazobactam (ZOSYN) IVPB 3.375 g     3.375 g 100 mL/hr over 30 Minutes Intravenous  Once 12/10/18 2211 12/10/18 2256      Subjective  The patient is lying quietly. No new complaints.  Objective   Vitals:  Vitals:   12/13/18 1032 12/13/18 1336  BP: 112/68 (!) 95/54  Pulse: (!) 102 93  Resp: 16 18  Temp: (!) 97.5 F (36.4 C) (!) 97.5 F (36.4 C)  SpO2: 100% 100%    Exam:  Constitutional:  . The patient is awake, alert, and oriented x 3. No acute distress. Respiratory:  . No increased work of breathing. . No wheezes, rales, or rhonchi. . No tactile fremitus. Cardiovascular:  . Regular rate and rhythm. . No murmurs, ectopy, or gallups. . No lateral PMI. No thrills. Abdomen:  . Abdomen is soft, non-tender, non-distended. . No hernias, masses, or organomegaly. . Hypoactive bowel sounds . Eakins pouch in place. Musculoskeletal:  . No cyanosis, clubbing, or edema Skin:  . No rashes, lesions, ulcers . palpation of skin: no induration or nodules Neurologic:  . CN 2-12 intact . Sensation all 4 extremities intact Psychiatric:  . Mental status o Mood, affect appropriate o Orientation to person, place, time  . judgment and insight appear intact     I have personally reviewed the following:   Today's Data  . CMP, CBC, Vitals  Micro Data  . Nares MSSA and MRSA positive.  Imaging  . CT abdomen: Entero-cutaneous fistula.  Scheduled Meds: . amiodarone  200 mg Oral Daily  . carvedilol  3.125 mg Oral BID WC  . Chlorhexidine Gluconate Cloth  6 each Topical Daily  . heparin  5,000 Units Subcutaneous Q8H  . insulin aspart  0-9 Units Subcutaneous Q4H  . mirtazapine  15 mg Oral QPM  . mupirocin ointment  1 application Nasal BID  . pantoprazole  40 mg Oral Daily  . saccharomyces  boulardii  250 mg Oral BID   Continuous Infusions: . dextrose 5 % and 0.45% NaCl 45 mL/hr at 12/13/18 0948  . TPN ADULT (ION)    . TPN ADULT (ION)      Active Problems:   AKI (acute kidney injury) ([place])   Unstageable pressure ulcer of sacral region ([place])   LOS: 3 days   A & P  Acute kidney injury-patient presented with acute kidney injury with creatinine 2.94, her baseline creatinine as of November 12, 2018 was 1.89.  Started on 0.45% normal saline at 100 mL/h as patient also had hypernatremia with sodium 156. Sodium today is 138. Creatinine is 2.28 today. Follow creatinine, electrolytes, and volume status carefully.  Hypernatremia: Resolved with TPN. Sodium is 138 today.   Hypokalemia: Supplement. Monitor.  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  No immediate surgical intervention required.  General surgery consulted and Eakins pouch in place. They have recommended CT again later this week.  Cholecystitis-CT scan showed inflammation around gallbladder.  Will obtain abdominal ultrasound in a.m.  Zosyn has been discontinued.  History of wide-complex tachycardia/CAD-continue amiodarone, Coreg.  Diabetes mellitus/hypoglycemia: IV D10 with IV 1//2 NS. Monitor. HbA1c 4.7.  I have seen and examined this patient myself. I have spent 35 minutes in her evaluation and care.  DVT prophylaxis: Heparin Code Status: Full Code Family Communication: No family present. Disposition Plan: tbd.    Devery Murgia, DO [facility] Direct contact: see [email or web address]  7PM-7AM contact night coverage as above 12/13/2018, 5:34 PM  LOS: 1 day

## 2018-12-13 NOTE — Progress Notes (Signed)
Unable to measure volume of urine output due to patient having bowel movements that clog the purewick each time she voids.

## 2018-12-13 NOTE — Progress Notes (Signed)
[facility] NOTE   Pharmacy Consult for TPN Indication: EC Fistula  Patient Measurements: Height: _0  (160 cm) Weight: 148 lb (67.1 kg) IBW/kg (Calculated) : 52.4 TPN AdjBW (KG): 56.1 Body mass index is 26.22 kg/m.  Assessment: 78 year old female s/p exploratory laparotomy with lysis of adhesions and bowel resection for ischemic bowel in May 2000. Patient required TPN during that admission but was weaned off to enteral diet. Re-admitted from home on 12/10/18 with feces draining from wound indicating EC fistula and AKI. Patient also with inflammation around her gallbladder. Patient has had ~10.5% weight gain over the past 3 months. Pharmacy consulted for TPN.   GI: EC fistula - Eakin pouch placed. Poor candidate for reoperative surgery. Pre-albumin very low at 6.6.  Endo: Hypoglycemia prior to dextrose and TPN. CBGs 110-181 after start of TPN + D5 at 45 mL/hr. Insulin requirements in the past 24 hours: 4 unit SSI Lytes: Na 147 >>138 (drop w/ D5 on board), Cl down 113 and CO2 18. K down 2.9 after 4 runs on 8/21. Phos low 1.4 despite replacement with 24mol NaPhos yesterday. Mg 1.8 after replacement with 4g yesterday. CoCa 9.2.  Renal: AKI on CKD, SCR improving 2.94 >>2.28, BUN 38 >>32. Confirmed with RN that patient is making good urine though not recorded.  Pulm: RA Cards: BP improved, HR 80s.  Hepatobil: AST/ALT/Tbili down to normal limits. Alk Phos elevated but trending down. Alb 1 Neuro: Pain 0 to 4. NuDESC 1.  ID: Zosyn 8/20 for possible intra-abdominal infection. WBC wnl.   TPN Access: Right IJ tunneled CVC catheter placed 8/21 TPN start date: 12/12/18 Nutritional Goals (per RD recommendation on 8/21): KCal: 1800-2000 Protein: 90-105 grams Fluid: >1.8L  Goal TPN rate is 70 ml/hr (provides 100%)  Current Nutrition:  NPO  TPN + D5 at 45 mL/hr.   Plan:  Continue TPN at 361mhr until electrolytes stabilized. This TPN provides 43.2 g of  protein, 115.2 g of dextrose, and 23 g of lipids which provides 795 kCals per day, meeting 44% of patient needs Electrolytes in TPN: Will add back Na, Phos, and Mg and increase K to standard. 1:2 ClRJ:WBDGREU*Give NaPhos 30 mmol IV x1, KCl 1039mIV x4, and Mag 2g IV x1 outside of TPN today.  *Recheck BMET, Mg, and Phos at 1700 and further replace if needed.   Add Multivitamin to TPN. Trace elements MWF only.  Continue sensitive SSI and adjust as needed Change to D5-1/2NS at 45 mL/hr for now with low rate TPN - will plan to discontinue when able to advance TPN.  Monitor TPN labs F/U plans   JesSloan LeiterharmD, BCPS, BCCCP Clinical Pharmacist [facility] 12/13/2018,8:39 AM

## 2018-12-14 LAB — GLUCOSE, CAPILLARY
Glucose-Capillary: 151 mg/dL — ABNORMAL HIGH (ref 70–99)
Glucose-Capillary: 111 mg/dL — ABNORMAL HIGH (ref 70–99)
Glucose-Capillary: 140 mg/dL — ABNORMAL HIGH (ref 70–99)
Glucose-Capillary: 121 mg/dL — ABNORMAL HIGH (ref 70–99)
Glucose-Capillary: 136 mg/dL — ABNORMAL HIGH (ref 70–99)

## 2018-12-14 LAB — MAGNESIUM: Magnesium: 2.3 mg/dL (ref 1.7–2.4)

## 2018-12-14 LAB — PHOSPHORUS: Phosphorus: 3.7 mg/dL (ref 2.5–4.6)

## 2018-12-14 LAB — BASIC METABOLIC PANEL
Anion gap: 8 (ref 5–15)
BUN: 34 mg/dL — ABNORMAL HIGH (ref 8–23)
CO2: 20 mmol/L — ABNORMAL LOW (ref 22–32)
Calcium: 7.1 mg/dL — ABNORMAL LOW (ref 8.9–10.3)
Chloride: 111 mmol/L (ref 98–111)
Creatinine, Ser: 1.84 mg/dL — ABNORMAL HIGH (ref 0.44–1.00)
GFR calc Af Amer: 29 mL/min — ABNORMAL LOW (ref >60)
GFR calc non Af Amer: 25 mL/min — ABNORMAL LOW (ref >60)
Glucose, Bld: 174 mg/dL — ABNORMAL HIGH (ref 70–99)
Potassium: 3.3 mmol/L — ABNORMAL LOW (ref 3.5–5.1)
Sodium: 139 mmol/L (ref 135–145)

## 2018-12-14 MED ORDER — TRAVASOL 10 % IV SOLN
INTRAVENOUS | Status: AC
  Administered 2018-12-14: 18:00:00 via INTRAVENOUS
  Filled 2018-12-14: qty 720

## 2018-12-14 MED ORDER — POTASSIUM CHLORIDE 10 MEQ/50ML IV SOLN
10.0000 meq | INTRAVENOUS | Status: AC
  Administered 2018-12-14 (×3): 10 meq via INTRAVENOUS
  Filled 2018-12-14 (×3): qty 50

## 2018-12-14 MED ORDER — DEXTROSE-NACL 5-0.45 % IV SOLN
INTRAVENOUS | Status: AC
  Administered 2018-12-15: 08:00:00 via INTRAVENOUS

## 2018-12-14 NOTE — Progress Notes (Signed)
Subjective: CC: No complaints. Hard of hearing. Had a BM yesterday.   Objective: Vital signs in last 24 hours: Temp:  [97.3 F (36.3 C)-98.1 F (36.7 C)] 98.1 F (36.7 C) (08/23 0605) Pulse Rate:  [80-102] 80 (08/23 0605) Resp:  [16-18] 16 (08/23 0605) BP: (95-130)/(54-68) 125/63 (08/23 0605) SpO2:  [100 %] 100 % (08/23 0605) Last BM Date: 12/13/18  Intake/Output from previous day: 08/22 0701 - 08/23 0700 In: 1073.8 [I.V.:1073.8] Out: 350 [Urine:350] Intake/Output this shift: No intake/output data recorded.  PE: Gen: Frail elderly female in NAD Lungs: Normal rate and effort Abd: Soft, ND, NT, eakins pouch on with only scant amount of thin yellow output. None recorded overnight. +BS Msk: No edema   Lab Results:  Recent Labs    12/12/18 0205 12/13/18 0307  WBC 4.2 4.4  HGB 8.9* 7.8*  HCT 29.7* 25.5*  PLT 60* 66*   BMET Recent Labs    12/13/18 1843 12/14/18 0405  NA 138 139  K 4.3 3.3*  CL 113* 111  CO2 18* 20*  GLUCOSE 300* 174*  BUN 32* 34*  CREATININE 1.89* 1.84*  CALCIUM 7.0* 7.1*   PT/INR No results for input(s): LABPROT, INR in the last 72 hours. CMP     Component Value Date/Time   NA 139 12/14/2018 0405   K 3.3 (L) 12/14/2018 0405   CL 111 12/14/2018 0405   CO2 20 (L) 12/14/2018 0405   GLUCOSE 174 (H) 12/14/2018 0405   BUN 34 (H) 12/14/2018 0405   CREATININE 1.84 (H) 12/14/2018 0405   CREATININE 1.07 (H) 02/13/2018 1142   CALCIUM 7.1 (L) 12/14/2018 0405   PROT 4.8 (L) 12/13/2018 0307   ALBUMIN 1.0 (L) 12/13/2018 0307   AST 30 12/13/2018 0307   ALT 15 12/13/2018 0307   ALKPHOS 251 (H) 12/13/2018 0307   BILITOT 0.8 12/13/2018 0307   GFRNONAA 25 (L) 12/14/2018 0405   GFRNONAA 49 (L) 02/13/2018 1142   GFRAA 29 (L) 12/14/2018 0405   GFRAA 57 (L) 02/13/2018 1142   Lipase     Component Value Date/Time   LIPASE 45 12/10/2018 1722       Studies/Results: Ir Fluoro Guide Cv Line Right  Addendum Date: 12/13/2018   ADDENDUM  REPORT: 12/13/2018 08:27 ADDENDUM: The impression should read: Placement of tunneled central venous catheter. Electronically Signed   By: Aletta Edouard M.D.   On: 12/13/2018 08:27   Result Date: 12/13/2018 CLINICAL DATA:  Enterocutaneous fistula and need for central venous catheter for parental nutrition. EXAM: TUNNELED CENTRAL VENOUS CATHETER PLACEMENT WITH ULTRASOUND AND FLUOROSCOPIC GUIDANCE FLUOROSCOPY TIME:  36 seconds.  1.0 mGy. PROCEDURE: The procedure, risks, benefits, and alternatives were explained to the patient. Questions regarding the procedure were encouraged and answered. The patient understands and consents to the procedure. A time-out was performed prior to initiating the procedure. Ultrasound was used to confirm patency of the right internal jugular vein. The right neck and chest were prepped with chlorhexidine in a sterile fashion, and a sterile drape was applied covering the operative field. Maximum barrier sterile technique with sterile gowns and gloves were used for the procedure. Local anesthesia was provided with 1% lidocaine. After creating a small venotomy incision, a 21 gauge needle was advanced into the right internal jugular vein under direct, real-time ultrasound guidance. Ultrasound image documentation was performed. After securing guidewire access a peel-away sheath was placed over a guide wire. Appropriate catheter length was estimated by guidewire. A 6 French dual-lumen  power line was tunneled from the right upper chest wall to the venotomy incision. The catheter was cut to 23 cm based on guidewire measurement. The catheter was then placed through the sheath and the sheath removed. Final catheter positioning was confirmed and documented with a fluoroscopic spot image. The catheter was aspirated and flushed with saline. The catheter exit site was secured with Ethilon and Prolene retention sutures. The venotomy incision was closed with subcutaneous 4-0 Vicryl and Dermabond.  COMPLICATIONS: None.  No pneumothorax. FINDINGS: After catheter placement, the tip lies at the cavoatrial junction. The catheter aspirates normally and is ready for immediate use. IMPRESSION: Placement of non-tunneled central venous catheter via the right internal jugular vein. The catheter tip lies at the cavoatrial junction. The catheter is ready for immediate use. Electronically Signed: By: Aletta Edouard M.D. On: 12/13/2018 08:00   Ir US Guide Vasc Access Right  Addendum Date: 12/13/2018   ADDENDUM REPORT: 12/13/2018 08:27 ADDENDUM: The impression should read: Placement of tunneled central venous catheter. Electronically Signed   By: Aletta Edouard M.D.   On: 12/13/2018 08:27   Result Date: 12/13/2018 CLINICAL DATA:  Enterocutaneous fistula and need for central venous catheter for parental nutrition. EXAM: TUNNELED CENTRAL VENOUS CATHETER PLACEMENT WITH ULTRASOUND AND FLUOROSCOPIC GUIDANCE FLUOROSCOPY TIME:  36 seconds.  1.0 mGy. PROCEDURE: The procedure, risks, benefits, and alternatives were explained to the patient. Questions regarding the procedure were encouraged and answered. The patient understands and consents to the procedure. A time-out was performed prior to initiating the procedure. Ultrasound was used to confirm patency of the right internal jugular vein. The right neck and chest were prepped with chlorhexidine in a sterile fashion, and a sterile drape was applied covering the operative field. Maximum barrier sterile technique with sterile gowns and gloves were used for the procedure. Local anesthesia was provided with 1% lidocaine. After creating a small venotomy incision, a 21 gauge needle was advanced into the right internal jugular vein under direct, real-time ultrasound guidance. Ultrasound image documentation was performed. After securing guidewire access a peel-away sheath was placed over a guide wire. Appropriate catheter length was estimated by guidewire. A 6 French dual-lumen power  line was tunneled from the right upper chest wall to the venotomy incision. The catheter was cut to 23 cm based on guidewire measurement. The catheter was then placed through the sheath and the sheath removed. Final catheter positioning was confirmed and documented with a fluoroscopic spot image. The catheter was aspirated and flushed with saline. The catheter exit site was secured with Ethilon and Prolene retention sutures. The venotomy incision was closed with subcutaneous 4-0 Vicryl and Dermabond. COMPLICATIONS: None.  No pneumothorax. FINDINGS: After catheter placement, the tip lies at the cavoatrial junction. The catheter aspirates normally and is ready for immediate use. IMPRESSION: Placement of non-tunneled central venous catheter via the right internal jugular vein. The catheter tip lies at the cavoatrial junction. The catheter is ready for immediate use. Electronically Signed: By: Aletta Edouard M.D. On: 12/13/2018 08:00   [place] Ekg Site Rite  Result Date: 12/12/2018 If Site Rite image not attached, placement could not be confirmed due to current cardiac rhythm.  [place] Ekg Site Rite  Result Date: 12/12/2018 If [facility] image not attached, placement could not be confirmed due to current cardiac rhythm.   Anti-infectives: Anti-infectives (From admission, onward)   Start     Dose/Rate Route Frequency Ordered Stop   12/11/18 1000  piperacillin-tazobactam (ZOSYN) IVPB 3.375 g  Status:  Discontinued  3.375 g 12.5 mL/hr over 240 Minutes Intravenous Every 12 hours 12/10/18 2220 12/11/18 0522   12/11/18 1000  piperacillin-tazobactam (ZOSYN) IVPB 3.375 g  Status:  Discontinued     3.375 g 12.5 mL/hr over 240 Minutes Intravenous Every 12 hours 12/11/18 0524 12/12/18 1122   12/11/18 0800  piperacillin-tazobactam (ZOSYN) IVPB 2.25 g  Status:  Discontinued     2.25 g 100 mL/hr over 30 Minutes Intravenous Every 6 hours 12/11/18 0522 12/11/18 0524   12/10/18 2215  piperacillin-tazobactam (ZOSYN) IVPB  3.375 g     3.375 g 100 mL/hr over 30 Minutes Intravenous  Once 12/10/18 2211 12/10/18 2256       Assessment/Plan HTN HLD Hx of GI bleed GERD CHF COPD Hx of ICD CAD CKD w/ AKI - improving, Cr 1.84 Chronic Anemia - 7.8 (8/22)  EC fistula Eakins pouch monitor output, likely repeat ct at some point this week, hopefully heals conservatively but would give her several months at least to see SBO with necrotic/dead small bowel S/p exploratory laparotomy with lysis of adhesions resection of small bowel and appendectomy 08/21/2018 Dr. Eddie Dibbles TothIII  FEN: NPO/ IVF and TPN (being increased today per pharmacy) ID: Zosyn 8/20. None currently.  DVT: SCDs, Heparin  Follow-up: Dr. Marlou Starks   LOS: 4 days    Jillyn Ledger , [facility] 12/14/2018, 8:59 AM Pager: [phone]

## 2018-12-14 NOTE — Progress Notes (Signed)
[place] NOTE   Pharmacy Consult for TPN Indication: EC Fistula  Patient Measurements: Height: '5\' 3"'  (160 cm) Weight: 148 lb (67.1 kg) IBW/kg (Calculated) : 52.4 TPN AdjBW (KG): 56.1 Body mass index is 26.22 kg/m.  Assessment: 78 year old female s/p exploratory laparotomy with lysis of adhesions and bowel resection for ischemic bowel in May 2000. Patient required TPN during that admission but was weaned off to enteral diet. Re-admitted from home on 12/10/18 with feces draining from wound indicating EC fistula and AKI. Patient also with inflammation around her gallbladder. Patient has had ~10.5% weight gain over the past 3 months. Pharmacy consulted for TPN.   GI: EC fistula - Eakin pouch placed. Poor candidate for reoperative surgery. Pre-albumin very low at 6.6. PPI po. Florastor po.  Endo: Hypoglycemia prior to dextrose and TPN. CBGs 103-174 after start of TPN + D5-1/2NS at 45 mL/hr. Insulin requirements in the past 24 hours: 4 unit SSI Lytes: Na 139, Cl down 111 and CO2 up 20. K 3.3- will replace. Phos and Mag at goal. CoCa 9.5.  Renal: AKI on CKD, SCR improving at 1.84, BUN 34. Confirmed with RN that patient is making good urine though difficult to measure due to clogging of the purewick.  Pulm: RA Cards: BP improved, HR 80s on Amiodarone and Coreg. Hepatobil: AST/ALT/Tbili/TG wnl. Alk Phos elevated but trending down. Alb 1. Neuro: Pain 0 to 4. NuDESC 1. Remeron every PM.  ID: s/p Zosyn for possible intra-abdominal infection. WBC wnl.   TPN Access: Right IJ tunneled CVC catheter placed 8/21 TPN start date: 12/12/18 Nutritional Goals (per RD recommendation on 8/21): KCal: 1800-2000 Protein: 90-105 grams Fluid: >1.8L  Goal TPN rate is 70 ml/hr (provides 100%)  Current Nutrition:  NPO  TPN + D5-1/2NS at 45 mL/hr.   Plan:  Increase TPN to 50 mL/hr - advancing slow to ensure electrolyte stabilization and prevent refeeding. Plan to  advance to goal tomorrow.  This TPN provides 72 g of protein, 192 g of dextrose, and 38 g of lipids which provides 1324 kCals per day, meeting 80% of protein and 74% of kcal needs Electrolytes in TPN: Continue lytes at standard as AKI continues to improve. Keep 1:2 VZ:SMOLMBE. *Give additional KCl 7mq IV x3 outside of TPN today Add Multivitamin to TPN. Trace elements MWF only.  Continue sensitive SSI and adjust as needed Reduce D5-1/2NS to 20 mL/hr at 1800PM when new TPN hung- will plan to discontinue this additional IVF tomorrow PM when advance to goal TPN.  Monitor TPN labs in AM. F/U plans regarding ability to resume enteral nutrition   *Watch CBC - Hgb and Plts low, remains on sq heparin at this time  JSloan Leiter PharmD, BCPS, BCCCP Clinical Pharmacist Please refer to [facility]for [place]numbers 12/14/2018,7:15 AM

## 2018-12-14 NOTE — Progress Notes (Signed)
PROGRESS NOTE  CHELAN HERINGER PXT:[id number] DOB: 1941-01-07 DOA: 12/10/2018 PCP: [name], MD  Brief History   [name]  is a 78 y.o. female, with history of anemia of chronic disease, ASCVD, AICD placement, history of breast cancer, chronic bronchitis, stage III CKD, systolic heart failure, hyperlipidemia, GERD, gout, hypertension, rheumatoid arthritis who recently had expiratory laparotomy with lysis of adhesions, bowel resection for ischemic bowel in May 2020.  She had large abdominal wound, which is healing by secondary intention.  As per family they noted feces coming from the wound.  No abdominal pain or fever.  Patient was brought to the hospital for further evaluation.  Patient is a poor historian.  History obtained from ED records.  CT of the abdomen pelvis was done, no results are available in epic due to technical issues.  ED physician spoke to radiologist verbally about the results.    As per ED physician, there is an enterocutaneous fistula and inflammation around the gallbladder.  No acute cholecystitis.  General surgery, Dr. Rosendo Gros was consulted, he recommended no immediate surgery at this time.  Recommended patient to transfer to [facility] and surgery have been consulted again and have seen patient this morning. The patient now has an Eakins pouch, wound care, and TPN. Cental venous catheter has been placed.  Consultants  . General Surgery  Procedures  . PICC line placement.  Antibiotics   Anti-infectives (From admission, onward)   Start     Dose/Rate Route Frequency Ordered Stop   12/11/18 1000  piperacillin-tazobactam (ZOSYN) IVPB 3.375 g  Status:  Discontinued     3.375 g 12.5 mL/hr over 240 Minutes Intravenous Every 12 hours 12/10/18 2220 12/11/18 0522   12/11/18 1000  piperacillin-tazobactam (ZOSYN) IVPB 3.375 g  Status:  Discontinued     3.375 g 12.5 mL/hr over 240 Minutes Intravenous Every 12 hours 12/11/18 0524 12/12/18 1122   12/11/18 0800   piperacillin-tazobactam (ZOSYN) IVPB 2.25 g  Status:  Discontinued     2.25 g 100 mL/hr over 30 Minutes Intravenous Every 6 hours 12/11/18 0522 12/11/18 0524   12/10/18 2215  piperacillin-tazobactam (ZOSYN) IVPB 3.375 g     3.375 g 100 mL/hr over 30 Minutes Intravenous  Once 12/10/18 2211 12/10/18 2256      Subjective  The patient is lying quietly. No new complaints. She has had 4 loose BM's today.  Objective   Vitals:  Vitals:   12/13/18 1955 12/14/18 0605  BP: 130/64 125/63  Pulse: 80 80  Resp: 18 16  Temp: (!) 97.3 F (36.3 C) 98.1 F (36.7 C)  SpO2: 100% 100%    Exam:  Constitutional:  . The patient is awake, alert, and oriented x 3. No acute distress. Respiratory:  . No increased work of breathing. . No wheezes, rales, or rhonchi. . No tactile fremitus. Cardiovascular:  . Regular rate and rhythm. . No murmurs, ectopy, or gallups. . No lateral PMI. No thrills. Abdomen:  . Abdomen is soft, non-tender, non-distended. . No hernias, masses, or organomegaly. . Hypoactive bowel sounds . Eakins pouch in place. Musculoskeletal:  . No cyanosis, clubbing, or edema Skin:  . No rashes, lesions, ulcers . palpation of skin: no induration or nodules Neurologic:  . CN 2-12 intact . Sensation all 4 extremities intact Psychiatric:  . Mental status o Mood, affect appropriate o Orientation to person, place, time  . judgment and insight appear intact     I have personally reviewed the following:   Today's Data  .  CMP, CBC, Vitals  Micro Data  . Nares MSSA and MRSA positive.  Imaging  . CT abdomen: Entero-cutaneous fistula.  Scheduled Meds: . amiodarone  200 mg Oral Daily  . carvedilol  3.125 mg Oral BID WC  . Chlorhexidine Gluconate Cloth  6 each Topical Daily  . heparin  5,000 Units Subcutaneous Q8H  . insulin aspart  0-9 Units Subcutaneous Q4H  . mirtazapine  15 mg Oral QPM  . mupirocin ointment  1 application Nasal BID  . pantoprazole  40 mg Oral Daily   . saccharomyces boulardii  250 mg Oral BID   Continuous Infusions: . dextrose 5 % and 0.45% NaCl 20 mL/hr at 12/14/18 1836  . TPN ADULT (ION) 50 mL/hr at 12/14/18 1754    Active Problems:   AKI (acute kidney injury) ([place])   Unstageable pressure ulcer of sacral region ([place])   LOS: 4 days   A & P  Acute kidney injury-patient presented with acute kidney injury with creatinine 2.94, her baseline creatinine as of November 12, 2018 was 1.89.  Started on 0.45% normal saline at 100 mL/h as patient also had hypernatremia with sodium 156. Sodium today is 138. Creatinine is 2.28 today. Follow creatinine, electrolytes, and volume status carefully.  Hypernatremia: Resolved with TPN. Sodium is 138 today.   Loose BM's: 4 loose bm's today. Likely due to no solid food for some time. Nursing is not suspicious the C Diff is the source. She states that they are small in volume. Will monitor electrolytes and volume status.  Hypokalemia: Supplement. Monitor.  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  No immediate surgical intervention required.  General surgery consulted and Eakins pouch in place. They have recommended CT again later this week.  Cholecystitis-CT scan showed inflammation around gallbladder.  Will obtain abdominal ultrasound in a.m.  Zosyn has been discontinued.  History of wide-complex tachycardia/CAD-continue amiodarone, Coreg.  Diabetes mellitus/hypoglycemia: IV D10 with IV 1//2 NS. Monitor. HbA1c 4.7.  I have seen and examined this patient myself. I have spent 32 minutes in her evaluation and care.  DVT prophylaxis: Heparin Code Status: Full Code Family Communication: No family present. Disposition Plan: tbd.    Cristle Jared, DO [facility] Direct contact: see [email or web address]  7PM-7AM contact night coverage as above 12/14/2018, 6:44 PM  LOS: 1 day

## 2018-12-14 NOTE — Plan of Care (Signed)
  Problem: Education: Goal: Knowledge of General Education information will improve Description: Including pain rating scale, medication(s)/side effects and non-pharmacologic comfort measures Outcome: Progressing   Problem: Health Behavior/Discharge Planning: Goal: Ability to manage health-related needs will improve Outcome: Progressing   Problem: Clinical Measurements: Goal: Ability to maintain clinical measurements within normal limits will improve Outcome: Progressing Goal: Cardiovascular complication will be avoided Outcome: Progressing   Problem: Nutrition: Goal: Adequate nutrition will be maintained Outcome: Progressing   Problem: Coping: Goal: Level of anxiety will decrease Outcome: Progressing   Problem: Pain Managment: Goal: General experience of comfort will improve Outcome: Progressing   Problem: Safety: Goal: Ability to remain free from injury will improve Outcome: Progressing   Problem: Skin Integrity: Goal: Risk for impaired skin integrity will decrease Outcome: Progressing

## 2018-12-15 ENCOUNTER — Other Ambulatory Visit: Payer: Self-pay

## 2018-12-15 LAB — TRIGLYCERIDES: Triglycerides: 154 mg/dL — ABNORMAL HIGH (ref <150)

## 2018-12-15 LAB — COMPREHENSIVE METABOLIC PANEL
ALT: 16 U/L (ref 0–44)
AST: 21 U/L (ref 15–41)
Albumin: <1 g/dL — ABNORMAL LOW (ref 3.5–5.0)
Alkaline Phosphatase: 203 U/L — ABNORMAL HIGH (ref 38–126)
Anion gap: 5 (ref 5–15)
BUN: 35 mg/dL — ABNORMAL HIGH (ref 8–23)
CO2: 22 mmol/L (ref 22–32)
Calcium: 7.4 mg/dL — ABNORMAL LOW (ref 8.9–10.3)
Chloride: 112 mmol/L — ABNORMAL HIGH (ref 98–111)
Creatinine, Ser: 1.62 mg/dL — ABNORMAL HIGH (ref 0.44–1.00)
GFR calc Af Amer: 34 mL/min — ABNORMAL LOW (ref >60)
GFR calc non Af Amer: 30 mL/min — ABNORMAL LOW (ref >60)
Glucose, Bld: 160 mg/dL — ABNORMAL HIGH (ref 70–99)
Potassium: 4.2 mmol/L (ref 3.5–5.1)
Sodium: 139 mmol/L (ref 135–145)
Total Bilirubin: 0.5 mg/dL (ref 0.3–1.2)
Total Protein: 4.7 g/dL — ABNORMAL LOW (ref 6.5–8.1)

## 2018-12-15 LAB — DIFFERENTIAL
Abs Immature Granulocytes: 0.09 10*3/uL — ABNORMAL HIGH (ref 0.00–0.07)
Basophils Absolute: 0 10*3/uL (ref 0.0–0.1)
Basophils Relative: 0 %
Eosinophils Absolute: 0.1 10*3/uL (ref 0.0–0.5)
Eosinophils Relative: 1 %
Immature Granulocytes: 1 %
Lymphocytes Relative: 18 %
Lymphs Abs: 1.2 10*3/uL (ref 0.7–4.0)
Monocytes Absolute: 0.3 10*3/uL (ref 0.1–1.0)
Monocytes Relative: 4 %
Neutro Abs: 5 10*3/uL (ref 1.7–7.7)
Neutrophils Relative %: 76 %

## 2018-12-15 LAB — CBC
HCT: 24.2 % — ABNORMAL LOW (ref 36.0–46.0)
Hemoglobin: 7.8 g/dL — ABNORMAL LOW (ref 12.0–15.0)
MCH: 26.3 pg (ref 26.0–34.0)
MCHC: 32.2 g/dL (ref 30.0–36.0)
MCV: 81.5 fL (ref 80.0–100.0)
Platelets: 73 10*3/uL — ABNORMAL LOW (ref 150–400)
RBC: 2.97 MIL/uL — ABNORMAL LOW (ref 3.87–5.11)
RDW: 19.1 % — ABNORMAL HIGH (ref 11.5–15.5)
WBC: 6.7 10*3/uL (ref 4.0–10.5)
nRBC: 0.5 % — ABNORMAL HIGH (ref 0.0–0.2)

## 2018-12-15 LAB — GLUCOSE, CAPILLARY
Glucose-Capillary: 129 mg/dL — ABNORMAL HIGH (ref 70–99)
Glucose-Capillary: 134 mg/dL — ABNORMAL HIGH (ref 70–99)
Glucose-Capillary: 145 mg/dL — ABNORMAL HIGH (ref 70–99)
Glucose-Capillary: 145 mg/dL — ABNORMAL HIGH (ref 70–99)
Glucose-Capillary: 154 mg/dL — ABNORMAL HIGH (ref 70–99)
Glucose-Capillary: 161 mg/dL — ABNORMAL HIGH (ref 70–99)

## 2018-12-15 LAB — PATHOLOGIST SMEAR REVIEW

## 2018-12-15 LAB — PREALBUMIN: Prealbumin: 6.9 mg/dL — ABNORMAL LOW (ref 18–38)

## 2018-12-15 LAB — MAGNESIUM: Magnesium: 2.1 mg/dL (ref 1.7–2.4)

## 2018-12-15 LAB — PHOSPHORUS: Phosphorus: 2.8 mg/dL (ref 2.5–4.6)

## 2018-12-15 MED ORDER — TRAVASOL 10 % IV SOLN
INTRAVENOUS | Status: AC
  Administered 2018-12-15: 17:00:00 via INTRAVENOUS
  Filled 2018-12-15: qty 1008

## 2018-12-15 MED ORDER — SODIUM CHLORIDE 0.9 % IV SOLN
INTRAVENOUS | Status: DC | PRN
  Administered 2018-12-15 – 2018-12-25 (×2): 250 mL via INTRAVENOUS

## 2018-12-15 NOTE — TOC Initial Note (Addendum)
Transition of Care [facility]) - Initial/Assessment Note    Patient Details  Name: [name] MRN: [id number] Date of Birth: 1940/12/18  [facility]) CM/SW Contact:    Savannah Favre, RN Phone Number: 12/15/2018, 12:40 PM  Clinical Narrative:                  Per chart patient from home with husband and Lovelady.   Spoke to patient , she answers "yeah" to every question.   Called husband [name] [phone] no answer , no voicemail. Also called [phone] nephew [name] answered. Will continue to try .[name]'s cell phone number is [phone]. Called [phone] no answer and voicemail box is not set up .   [name] called back from [phone]. He does want to take her home at discharge with home health through [facility] .   [name] still works but his niece stays with [name] while he is at work. Confirmed address.  Patient has hospital bed, walker, 3 in1 , and wheel chair at home already.  Barriers to Discharge: Continued Medical Work up   Patient Goals and CMS Choice Patient states their goals for this hospitalization and ongoing recovery are:: answers "yeah" to every question, Called husband [name] 336 [phone] CMS Medicare.gov Compare Post Acute Care list provided to:: Patient    Expected Discharge Plan and Services         Living arrangements for the past 2 months: Single Family Home                                      Prior Living Arrangements/Services Living arrangements for the past 2 months: Single Family Home Lives with:: Spouse                   Activities of Daily Living      Permission Sought/Granted                  Emotional Assessment              Admission diagnosis:  Hypernatremia [E87.0] Enterocutaneous fistula [K63.2] AKI (acute kidney injury) ([place]) [N17.9] Patient Active Problem List   Diagnosis Date Noted  . Unstageable pressure ulcer of sacral region ([place]) 12/11/2018  . Goals of care,  counseling/discussion   . Palliative care by specialist   . Acute respiratory failure with hypoxia ([place])   . Protein-calorie malnutrition, severe 08/25/2018  . Wide-complex tachycardia ([place])   . Acute renal failure superimposed on stage 3 chronic kidney disease ([place]) 08/18/2018  . SBO (small bowel obstruction) ([place]) 08/14/2018  . Pressure injury of skin 08/14/2018  . CAP (community acquired pneumonia) 08/13/2018  . AKI (acute kidney injury) ([place]) 08/10/2017  . Hyperkalemia 08/10/2017  . Acute blood loss anemia 09/20/2016  . Ischemic cardiomyopathy 09/20/2016  . Chronic combined systolic and diastolic CHF (congestive heart failure) ([place]) 09/20/2016  . CKD (chronic kidney disease), stage IV ([place]) 09/20/2016  . History of breast cancer 08/02/2016  . Primary osteoarthritis of both knees 08/02/2016  . Primary osteoarthritis of both feet 08/02/2016  . History of anemia 07/17/2016  . History of CHF (congestive heart failure) 06/04/2016  . History of chronic kidney disease 06/04/2016  . Symptomatic anemia 06/04/2016  . Elevated sedimentation rate 03/03/2016  . Idiopathic chronic gout of multiple sites without tophus 03/03/2016  .  Total knee replacement status, right 03/03/2016  . High risk medication use 03/03/2016  . Chronic kidney disease (CKD) stage G3b/A1, moderately decreased glomerular filtration rate (GFR) between 30-44 mL/min/1.73 square meter and albuminuria creatinine ratio less than 30 mg/g (HCC) 02/01/2016  . CAD S/P remote PCI- no details 09/02/2014  . Cardiomyopathy, ischemic-EF 30-35% March 2015 09/02/2014  . Diastolic dysfunction-grade 2 09/02/2014  . CHF exacerbation ([place]) 08/28/2014  . Acute on chronic combined systolic and diastolic congestive heart failure ([place]) 08/28/2014  . Iron deficiency anemia 08/20/2013  . Malnutrition of moderate degree ([place]) 07/21/2013  . PNA (pneumonia) 07/19/2013  . HCAP (healthcare-associated pneumonia) 07/17/2013  . Sepsis ([place]) 07/17/2013   . ARF (acute renal failure) ([place]) 07/17/2013  . Rheumatoid arthritis ([place]) 07/04/2013  . Hypokalemia 07/03/2013  . Chest pain 07/03/2013  . Biventricular ICD in place (MDT 2014) 11/06/2012  . Anemia of chronic disease   . Breast cancer ([place])   . Hypertension   . Dyslipidemia 05/24/2009  . Chronic systolic heart failure ([place]) 05/24/2009  . Primary osteoarthritis of both hands 08/11/2007   PCP:  [name], MD Pharmacy:   [id number] - [place], [place] [address] [place] [address] Phone: [phone] Fax: [phone]     Social Determinants of Health (SDOH) Interventions    Readmission Risk Interventions Readmission Risk Prevention Plan 09/17/2018 08/14/2018  Transportation Screening - Complete  Palliative Care Screening Complete -  Some recent data might be hidden

## 2018-12-15 NOTE — Progress Notes (Signed)
Nutrition Follow-up  DOCUMENTATION CODES:   Not applicable  INTERVENTION:   -TPN management per pharmacy -RD will follow for diet advancement and supplement as appropriate  NUTRITION DIAGNOSIS:   Inadequate oral intake related to altered GI function as evidenced by NPO status  Ongoing  GOAL:   Patient will meet greater than or equal to 90% of their needs  Progressing   MONITOR:   Diet advancement, Labs, Weight trends, Skin, I & O's  REASON FOR ASSESSMENT:   Consult New TPN/TNA  ASSESSMENT:   Karolyna Bianchini  is a 78 y.o. female, with history of anemia of chronic disease, ASCVD, AICD placement, history of breast cancer, chronic bronchitis, stage III CKD, systolic heart failure, hyperlipidemia, GERD, gout, hypertension, rheumatoid arthritis who recently had expiratory laparotomy with lysis of adhesions, bowel resection for ischemic bowel in May 2020.  She had large abdominal wound, which is healing by secondary intention.  As per family they noted feces coming from the wound.  No abdominal pain or fever.  Patient was brought to the hospital for further evaluation.  Patient is a poor historian.  History obtained from ED records.  8/21- CWOCN placed eakin pouch on EC fistula; midline catheter placed; tunnelled CVC placed; TPN initiated  Reviewed I/O's: +693 ml x 24 hours and +4.7 L since admission  Per general surgery notes, pt with minimal drainage with eakin pouch. Pt is a poor surgical candidate.   Pt currently receiving TPN at 50 ml/hr, which provides 1324 kcals and 72 grams protein, meeting 74% of estimated kcal needs and 80% of estimated protein needs. Per pharmacy note, plan to increase TPN to 70 ml/hr at 1800, which provides 1854 kcals and 101 grams protein, meeting 100% of estimated kcal and protein needs.  Labs reviewed: K, Mg, and Phos WDL. CBGS: 136-145 (inpatient orders for glycemic control are 0-9 units insulin aspart every 4 hours).   Diet Order:   Diet Order            Diet NPO time specified  Diet effective now              EDUCATION NEEDS:   Not appropriate for education at this time  Skin:  Skin Assessment: Skin Integrity Issues: Skin Integrity Issues:: Stage II, Other (Comment) Stage II: buttocks Other: open abdomen with draining fistula- eakin pouch placed  Last BM:  12/13/28  Height:   Ht Readings from Last 1 Encounters:  12/10/18 5\' 3"  (1.6 m)    Weight:   Wt Readings from Last 1 Encounters:  12/10/18 67.1 kg    Ideal Body Weight:  52.3 kg  BMI:  Body mass index is 26.22 kg/m.  Estimated Nutritional Needs:   Kcal:  1800-2000  Protein:  90-105 grams  Fluid:  > 1.8 L    Anani Gu A. Jimmye Norman, RD, LDN, [place] Registered Dietitian II Certified Diabetes Care and Education Specialist Pager: [phone] After hours Pager: [phone]

## 2018-12-15 NOTE — Progress Notes (Signed)
Patient ID: [name], female   DOB: [birth year], 78 y.o.   MRN: [id number]       Subjective: Sleeping and when tried to awaken she mostly just groans.    Objective: Vital signs in last 24 hours: Temp:  [97.5 F (36.4 C)-97.7 F (36.5 C)] 97.5 F (36.4 C) (08/24 0435) Pulse Rate:  [83-85] 85 (08/24 0435) Resp:  [15] 15 (08/24 0435) BP: (133-139)/(59-72) 133/59 (08/24 0435) SpO2:  [100 %] 100 % (08/24 0435) Last BM Date: 12/14/18  Intake/Output from previous day: 08/23 0701 - 08/24 0700 In: 693 [I.V.:693] Out: -  Intake/Output this shift: No intake/output data recorded.  PE: Abd: soft, NT, ND, midline wound appears mostly clean through Eakin's pouch.  Minimal slightly cloudy output noted in pouch.  No output documented.  Lab Results:  Recent Labs    12/13/18 0307 12/15/18 0405  WBC 4.4 6.7  HGB 7.8* 7.8*  HCT 25.5* 24.2*  PLT 66* 73*   BMET Recent Labs    12/14/18 0405 12/15/18 0405  NA 139 139  K 3.3* 4.2  CL 111 112*  CO2 20* 22  GLUCOSE 174* 160*  BUN 34* 35*  CREATININE 1.84* 1.62*  CALCIUM 7.1* 7.4*   PT/INR No results for input(s): LABPROT, INR in the last 72 hours. CMP     Component Value Date/Time   NA 139 12/15/2018 0405   K 4.2 12/15/2018 0405   CL 112 (H) 12/15/2018 0405   CO2 22 12/15/2018 0405   GLUCOSE 160 (H) 12/15/2018 0405   BUN 35 (H) 12/15/2018 0405   CREATININE 1.62 (H) 12/15/2018 0405   CREATININE 1.07 (H) 02/13/2018 1142   CALCIUM 7.4 (L) 12/15/2018 0405   PROT 4.7 (L) 12/15/2018 0405   ALBUMIN <1.0 (L) 12/15/2018 0405   AST 21 12/15/2018 0405   ALT 16 12/15/2018 0405   ALKPHOS 203 (H) 12/15/2018 0405   BILITOT 0.5 12/15/2018 0405   GFRNONAA 30 (L) 12/15/2018 0405   GFRNONAA 49 (L) 02/13/2018 1142   GFRAA 34 (L) 12/15/2018 0405   GFRAA 57 (L) 02/13/2018 1142   Lipase     Component Value Date/Time   LIPASE 45 12/10/2018 1722       Studies/Results: No results found.  Anti-infectives: Anti-infectives (From  admission, onward)   Start     Dose/Rate Route Frequency Ordered Stop   12/11/18 1000  piperacillin-tazobactam (ZOSYN) IVPB 3.375 g  Status:  Discontinued     3.375 g 12.5 mL/hr over 240 Minutes Intravenous Every 12 hours 12/10/18 2220 12/11/18 0522   12/11/18 1000  piperacillin-tazobactam (ZOSYN) IVPB 3.375 g  Status:  Discontinued     3.375 g 12.5 mL/hr over 240 Minutes Intravenous Every 12 hours 12/11/18 0524 12/12/18 1122   12/11/18 0800  piperacillin-tazobactam (ZOSYN) IVPB 2.25 g  Status:  Discontinued     2.25 g 100 mL/hr over 30 Minutes Intravenous Every 6 hours 12/11/18 0522 12/11/18 0524   12/10/18 2215  piperacillin-tazobactam (ZOSYN) IVPB 3.375 g     3.375 g 100 mL/hr over 30 Minutes Intravenous  Once 12/10/18 2211 12/10/18 2256       Assessment/Plan HTN HLD Hx of GI bleed GERD CHF COPD Hx of ICD CAD CKD w/ AKI - improving, Cr 1.84 Chronic Anemia - 7.8 (8/22)  EC fistula -does not appear to be high output -will monitor closely -cont current care with NPO/TNA to try and get this to heal.  Poor operative candidate. SBO with necrotic/dead small bowel S/p exploratory laparotomy  with lysis of adhesions resection of small bowel and appendectomy 08/21/2018 Dr. Eddie Dibbles TothIII  FEN: NPO/ IVF and TPN ID: Zosyn 8/20. None currently.  DVT: SCDs, Heparin  Follow-up: Dr. Marlou Starks   LOS: 5 days    [name] , [facility] 12/15/2018, 8:17 AM Pager: [phone]

## 2018-12-15 NOTE — Progress Notes (Addendum)
PROGRESS NOTE  BROOKLYN ALFREDO VWP:[id number] DOB: [birth year] DOA: 12/10/2018 PCP: [name], MD  Brief History   [name]  is a 78 y.o. female, with history of anemia of chronic disease, ASCVD, AICD placement, history of breast cancer, chronic bronchitis, stage III CKD, systolic heart failure, hyperlipidemia, GERD, gout, hypertension, rheumatoid arthritis who recently had expiratory laparotomy with lysis of adhesions, bowel resection for ischemic bowel in May 2020.  She had large abdominal wound, which is healing by secondary intention.  As per family they noted feces coming from the wound.  No abdominal pain or fever.  Patient was brought to the hospital for further evaluation.  Patient is a poor historian.  History obtained from ED records.   CT of the abdomen pelvis was done, no results are available in epic due to technical issues.  ED physician spoke to radiologist verbally about the results.     As per ED physician, there is an enterocutaneous fistula and inflammation around the gallbladder.  No acute cholecystitis.  General surgery, Dr. Rosendo Gros was consulted, he recommended no immediate surgery at this time.  Recommended patient to transfer to [facility] and surgery have been consulted again and have seen patient this morning. The patient now has an Eakins pouch, wound care, and TPN. Cental venous catheter has been placed.  Lynann Beaver pouch has been placed and has had low output. If she continues to have low output, the patient may be allowed to eat, per surgery, and discharged soon.  Consultants  General Surgery Wound care  Procedures  PICC line placement.  Antibiotics   Anti-infectives (From admission, onward)    Start     Dose/Rate Route Frequency Ordered Stop   12/11/18 1000  piperacillin-tazobactam (ZOSYN) IVPB 3.375 g  Status:  Discontinued     3.375 g 12.5 mL/hr over 240 Minutes Intravenous Every 12 hours 12/10/18 2220 12/11/18 0522   12/11/18 1000   piperacillin-tazobactam (ZOSYN) IVPB 3.375 g  Status:  Discontinued     3.375 g 12.5 mL/hr over 240 Minutes Intravenous Every 12 hours 12/11/18 0524 12/12/18 1122   12/11/18 0800  piperacillin-tazobactam (ZOSYN) IVPB 2.25 g  Status:  Discontinued     2.25 g 100 mL/hr over 30 Minutes Intravenous Every 6 hours 12/11/18 0522 12/11/18 0524   12/10/18 2215  piperacillin-tazobactam (ZOSYN) IVPB 3.375 g     3.375 g 100 mL/hr over 30 Minutes Intravenous  Once 12/10/18 2211 12/10/18 2256       Subjective  The patient is lying quietly. No new complaints.   Objective   Vitals:  Vitals:   12/15/18 0435 12/15/18 1642  BP: (!) 133/59 (!) 129/55  Pulse: 85 85  Resp: 15 18  Temp: (!) 97.5 F (36.4 C) 98.4 F (36.9 C)  SpO2: 100% 100%    Exam:  Constitutional:  The patient is awake, alert, and oriented x 3. No acute distress. Nonverbal for me. Respiratory:  No increased work of breathing. No wheezes, rales, or rhonchi. No tactile fremitus. Cardiovascular:  Regular rate and rhythm. No murmurs, ectopy, or gallups. No lateral PMI. No thrills. Abdomen:  Abdomen is soft, non-tender, non-distended. No hernias, masses, or organomegaly. Hypoactive bowel sounds Eakins pouch in place. Musculoskeletal:  No cyanosis, clubbing, or edema Skin:  No rashes, lesions, ulcers palpation of skin: no induration or nodules Neurologic:  CN 2-12 intact Sensation all 4 extremities intact Psychiatric:  Mental status Mood, affect appropriate Orientation to person, place, time  judgment and insight appear intact  I have personally reviewed the following:   Today's Data  CMP, CBC, Vitals  Micro Data  Nares MSSA and MRSA positive.  Imaging  CT abdomen: Entero-cutaneous fistula.  Scheduled Meds:  amiodarone  200 mg Oral Daily   carvedilol  3.125 mg Oral BID WC   Chlorhexidine Gluconate Cloth  6 each Topical Daily   heparin  5,000 Units Subcutaneous Q8H   insulin aspart  0-9 Units  Subcutaneous Q4H   mirtazapine  15 mg Oral QPM   mupirocin ointment  1 application Nasal BID   pantoprazole  40 mg Oral Daily   saccharomyces boulardii  250 mg Oral BID   Continuous Infusions:  TPN ADULT (ION) 70 mL/hr at 12/15/18 1724    Active Problems:   AKI (acute kidney injury) ([place])   Unstageable pressure ulcer of sacral region ([place])   LOS: 5 days   A & P  Acute kidney injury-patient presented with acute kidney injury with creatinine 2.94, her baseline creatinine as of November 12, 2018 was 1.89.  Started on 0.45% normal saline at 100 mL/h as patient also had hypernatremia with sodium 156. Sodium today is 138. Creatinine is 2.28 today. Follow creatinine, electrolytes, and volume status carefully.   Hypernatremia: Resolved with TPN. Sodium is 138 today.   Loose BM's: 4 loose bm's today. Likely due to no solid food for some time. Nursing is not suspicious the C Diff is the source. She states that they are small in volume. Will monitor electrolytes and volume status.  Hypokalemia: Supplement. Monitor.   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  No immediate surgical intervention required.  General surgery consulted and Eakins pouch in place. They have recommended CT again later this week. Lynann Beaver pouch has been placed and has had low output. If she continues to have low output, the patient may be allowed to eat, per surgery, and discharged soon. I am unable to determine if this fisula is a complication of her surgery or not.  Unstageable ulcer of the sacrum and Stage II pressure ulcer on buttock: Present on admission. Wound care consulted.   Cholecystitis-CT scan showed inflammation around gallbladder.  Will obtain abdominal ultrasound in a.m.  Zosyn has been discontinued.   History of wide-complex tachycardia/CAD-continue amiodarone, Coreg.   Diabetes mellitus/hypoglycemia: IV D10 with IV 1//2 NS. Monitor. HbA1c 4.7.   I have seen and examined this patient myself. I have spent 30 minutes in her evaluation and care.  DVT prophylaxis: Heparin Code Status: Full Code Family Communication: No family present. Disposition Plan: tbd.    Gowri Suchan, DO [facility] Direct contact: see [email or web address]  7PM-7AM contact night coverage as above 12/14/2018, 6:44 PM  LOS: 1 day

## 2018-12-15 NOTE — Plan of Care (Signed)
  Problem: Education: Goal: Knowledge of General Education information will improve Description: Including pain rating scale, medication(s)/side effects and non-pharmacologic comfort measures Outcome: Progressing   Problem: Health Behavior/Discharge Planning: Goal: Ability to manage health-related needs will improve Outcome: Progressing   Problem: Clinical Measurements: Goal: Ability to maintain clinical measurements within normal limits will improve Outcome: Progressing Goal: Cardiovascular complication will be avoided Outcome: Progressing   Problem: Nutrition: Goal: Adequate nutrition will be maintained Outcome: Progressing   Problem: Coping: Goal: Level of anxiety will decrease Outcome: Progressing   Problem: Pain Managment: Goal: General experience of comfort will improve Outcome: Progressing   Problem: Safety: Goal: Ability to remain free from injury will improve Outcome: Progressing   Problem: Skin Integrity: Goal: Risk for impaired skin integrity will decrease Outcome: Progressing

## 2018-12-15 NOTE — Consult Note (Addendum)
[name] wound consult follow-up note Reason for Consult: Eakin pouch to midline full thickness wound [name] is intact with good seal.  Mod amt thick tan drainage.  Supplies ordered to the bedside and instructions have been provided for bedside nurses to change PRN if leaking.  Pt will need home health after discharge for pouch application assistance.  Bedside nurse; change pouch PRN  If leaking as follows; cut opening and apply pouch over wound.  Attach spout to bedside drainage bag if large amt drainage.  Use small Mickie Bail #[id number] [place] team will reassess location weekly. Julien Girt MSN, RN, [place], [place], [place]

## 2018-12-15 NOTE — Progress Notes (Signed)
PHARMACY - ADULT TOTAL PARENTERAL NUTRITION CONSULT NOTE   Pharmacy Consult for TPN Indication: EC Fistula  Patient Measurements: Height: 5' 3" (160 cm) Weight: 148 lb (67.1 kg) IBW/kg (Calculated) : 52.4 TPN AdjBW (KG): 56.1 Body mass index is 26.22 kg/m.  Assessment: 78 year old female s/p exploratory laparotomy with lysis of adhesions and bowel resection for ischemic bowel in May 2000. Patient required TPN during that admission but was weaned off to enteral diet. Re-admitted from home on 12/10/18 with feces draining from wound indicating EC fistula and AKI. Patient also with inflammation around her gallbladder. Patient has had ~10.5% weight gain over the past 3 months. Pharmacy consulted for TPN.   GI: EC fistula - Eakin pouch placed. Poor candidate for reoperative surgery. Not much output. Pre-albumin very low at 6.6>6.9. PPI po. Florastor po.  Endo: Hypoglycemia prior to dextrose and TPN. CBGs 136-160 improved,  D5-1/2NS at 20 mL/hr. Insulin requirements in the past 24 hours: 6 unit SSI Lytes: Na 139, Cl down 112 and CO2 up 22. K 4.2 s/p replacement. Phos downtrend to 2.8 Renal: AKI on CKD, SCR improving at 1.84>1.62, BUN 35. UOP not well recorded.   Pulm: RA Cards: BP improved, HR 80s on Amiodarone and Coreg. Hepatobil: AST/ALT/Tbili/TG wnl. Alk Phos elevated but trending down. Alb 1. Neuro: Pain 0 to 4. NuDESC 1. Remeron every PM.  ID: s/p Zosyn for possible intra-abdominal infection. WBC wnl.   TPN Access: Right IJ tunneled CVC catheter placed 8/21 TPN start date: 12/12/18 Nutritional Goals (per RD recommendation on 8/21): KCal: 1800-2000 Protein: 90-105 grams Fluid: >1.8L  Goal TPN rate is 70 ml/hr (provides 100%)  Current Nutrition:  NPO  TPN + D5-1/2NS at 20 mL/hr.   Plan:  Increase TPN to 70 mL/hr  This TPN provides 101 g of protein, 269 g of dextrose, and 54 g of lipids which provides 1854 kCals per day, meeting 100% of protein and kcal needs Electrolytes in TPN:  lytes with rate increase as AKI continues to improve, except relative phos increase. Keep 1:2 Cl:Acetate. Add Multivitamin to TPN. Trace elements MWF only.  Continue sensitive SSI and adjust as needed D/c D5-1/2NS at 20 mL/hr at 1800 with TPN rate increase to goal BMP in AM F/U plans regarding ability to resume enteral nutrition   Jonathan Oriet, PharmD Clinical Pharmacist Please check AMION for all [facility] Pharmacy numbers 12/15/2018 9:09 AM  

## 2018-12-16 LAB — BASIC METABOLIC PANEL
Anion gap: 4 — ABNORMAL LOW (ref 5–15)
BUN: 38 mg/dL — ABNORMAL HIGH (ref 8–23)
CO2: 21 mmol/L — ABNORMAL LOW (ref 22–32)
Calcium: 7.4 mg/dL — ABNORMAL LOW (ref 8.9–10.3)
Chloride: 112 mmol/L — ABNORMAL HIGH (ref 98–111)
Creatinine, Ser: 1.3 mg/dL — ABNORMAL HIGH (ref 0.44–1.00)
GFR calc Af Amer: 45 mL/min — ABNORMAL LOW (ref >60)
GFR calc non Af Amer: 39 mL/min — ABNORMAL LOW (ref >60)
Glucose, Bld: 129 mg/dL — ABNORMAL HIGH (ref 70–99)
Potassium: 4.7 mmol/L (ref 3.5–5.1)
Sodium: 137 mmol/L (ref 135–145)

## 2018-12-16 LAB — GLUCOSE, CAPILLARY
Glucose-Capillary: 105 mg/dL — ABNORMAL HIGH (ref 70–99)
Glucose-Capillary: 112 mg/dL — ABNORMAL HIGH (ref 70–99)
Glucose-Capillary: 117 mg/dL — ABNORMAL HIGH (ref 70–99)
Glucose-Capillary: 137 mg/dL — ABNORMAL HIGH (ref 70–99)
Glucose-Capillary: 140 mg/dL — ABNORMAL HIGH (ref 70–99)
Glucose-Capillary: 147 mg/dL — ABNORMAL HIGH (ref 70–99)

## 2018-12-16 MED ORDER — STARCH (THICKENING) PO POWD
ORAL | Status: DC | PRN
  Filled 2018-12-16 (×2): qty 227

## 2018-12-16 MED ORDER — RESOURCE THICKENUP CLEAR PO POWD
ORAL | Status: DC | PRN
  Filled 2018-12-16: qty 125

## 2018-12-16 MED ORDER — TRAVASOL 10 % IV SOLN
INTRAVENOUS | Status: AC
  Administered 2018-12-16: 19:00:00 via INTRAVENOUS
  Filled 2018-12-16: qty 1008

## 2018-12-16 NOTE — Evaluation (Signed)
Clinical/Bedside Swallow Evaluation Patient Details  Name: [name] MRN: [id number] Date of Birth: [birth year]  Today's Date: 12/16/2018 Time: SLP Start Time (ACUTE ONLY): 42 SLP Stop Time (ACUTE ONLY): 1114 SLP Time Calculation (min) (ACUTE ONLY): 11 min  Past Medical History:  Past Medical History:  Diagnosis Date  . Anemia    Hgb of 9-10  . Anemia of chronic disease    Hgb of 9-10 chronically; 06/2010: H&H-10.7/33.5, MCV-81, normal iron studies in 2010   . Arteriosclerotic cardiovascular disease (ASCVD)    Remote PTCA by patient report; LBBB; associated cardiomyopathy, presumed ischemic with EF 40-45% previously, 20% in 06/2009; h/o clinical congestive heart failure; negative stress nuclear in 2009 with inferoseptal and apical scar  . Automatic implantable cardioverter-defibrillator in situ   . Breast cancer ([place])    breast  . Chronic bronchitis ([place])   . Chronic renal disease, stage 3, moderately decreased glomerular filtration rate (GFR) between 30-59 mL/min/1.73 square meter (HCC) 02/01/2016  . Congestive heart failure (CHF) ([place])   . Elevated cholesterol   . Elevated sed rate   . GERD (gastroesophageal reflux disease)   . GI bleed   . Gout   . HOH (hard of hearing)   . Hyperlipidemia   . Hypertension   . Rheumatoid arthritis ([place])   . UTI (urinary tract infection)    Past Surgical History:  Past Surgical History:  Procedure Laterality Date  . BI-VENTRICULAR IMPLANTABLE CARDIOVERTER DEFIBRILLATOR N/A 07/28/2012   Procedure: BI-VENTRICULAR IMPLANTABLE CARDIOVERTER DEFIBRILLATOR  (CRT-D);  Surgeon: Evans Lance, MD;  Location: [facility] CATH LAB;  Service: Cardiovascular;  Laterality: N/A;  . BI-VENTRICULAR IMPLANTABLE CARDIOVERTER DEFIBRILLATOR  (CRT-D)  07/28/2012  . BOWEL RESECTION N/A 08/21/2018   Procedure: Small Bowel Resection;  Surgeon: Jovita Kussmaul, MD;  Location: [facility];  Service: General;  Laterality: N/A;  . BREAST BIOPSY Bilateral   . CATARACT EXTRACTION W/  INTRAOCULAR LENS IMPLANT Left   . CATARACT EXTRACTION W/PHACO Right 09/15/2013   Procedure: CATARACT EXTRACTION PHACO AND INTRAOCULAR LENS PLACEMENT (IOC);  Surgeon: Elta Guadeloupe T. Gershon Crane, MD;  Location: [facility] OR[facility];  Service: Ophthalmology;  Laterality: Right;  CDE:  10.74  . COLONOSCOPY N/A 03/04/2015   Procedure: COLONOSCOPY;  Surgeon: Rogene Houston, MD;  Location: [facility] ENDO SU[facility];  Service: Endoscopy;  Laterality: N/A;  10:50   . ESOPHAGOGASTRODUODENOSCOPY N/A 03/04/2015   Procedure: ESOPHAGOGASTRODUODENOSCOPY (EGD);  Surgeon: Rogene Houston, MD;  [facility];  Service: Endoscopy;  Laterality: N/A;  . ESOPHAGOGASTRODUODENOSCOPY N/A 09/21/2016   Procedure: ESOPHAGOGASTRODUODENOSCOPY (EGD);  Surgeon: Rogene Houston, MD;  [facility];  Service: Endoscopy;  Laterality: N/A;  . GIVENS CAPSULE STUDY N/A 09/22/2016   Procedure: GIVENS CAPSULE STUDY;  Surgeon: Rogene Houston, MD;  [facility];  Service: Endoscopy;  Laterality: N/A;  . IR FLUORO GUIDE CV LINE RIGHT  12/12/2018  . IR US GUIDE VASC ACCESS RIGHT  12/12/2018  . LAPAROTOMY N/A 08/21/2018   Procedure: EXPLORATORY LAPAROTOMY WITH LYSIS OF ADHESIONS;  Surgeon: Jovita Kussmaul, MD;  Location: [facility];  Service: General;  Laterality: N/A;  . MASTECTOMY Left 1998  . TENDON TRANSFER Right 08/15/2017   Procedure: TENDON TRANSFER RIGHT WRIST EXTENSORS AS NEEDED, ULNA RESECTION AND STABILIZATION;  Surgeon: Charlotte Crumb, MD;  Location: [facility];  Service: Orthopedics;  Laterality: Right;  . TOTAL KNEE ARTHROPLASTY Right    Dr.Harrison  . TUBAL LIGATION     HPI:  Pt is a 78 y.o. female, with  history of anemia of chronic disease, ASCVD, AICD placement, history of breast cancer, chronic bronchitis, stage III CKD, systolic heart failure, hyperlipidemia, GERD, gout, hypertension, rheumatoid arthritis who recently had expiratory laparotomy with lysis of adhesions, bowel resection for ischemic bowel in May 2020.  She had large  abdominal wound, which is healing by secondary intention.  As per family they noted feces coming from the wound; s/p eakin pouch 8/21. Pt was followed by SLP during recent admission with recommedation for pureed diet, thin liquids, but pt had very little intake.   Assessment / Plan / Recommendation Clinical Impression  Pt accepted limited amounts of PO, but exhibited consistent coughing with thin liquids. No coughing was noted with purees or nectar thick liquids, suggestive of better tolerance, although SLP f/u will be indicated for continued assessment. Will start Dys 1 diet and nectar thick liquids for today.  SLP Visit Diagnosis: Dysphagia, unspecified (R13.10)    Aspiration Risk  Mild aspiration risk;Moderate aspiration risk;Risk for inadequate nutrition/hydration    Diet Recommendation Dysphagia 1 (Puree);Nectar-thick liquid   Liquid Administration via: Cup;Straw Medication Administration: Crushed with puree Supervision: Staff to assist with self feeding;Full supervision/cueing for compensatory strategies Compensations: Slow rate;Small sips/bites Postural Changes: Seated upright at 90 degrees;Remain upright for at least 30 minutes after po intake    Other  Recommendations Oral Care Recommendations: Oral care BID   Follow up Recommendations (tba)      Frequency and Duration min 2x/week  2 weeks       Prognosis Prognosis for Safe Diet Advancement: Fair Barriers to Reach Goals: Cognitive deficits      Swallow Study   General HPI: Pt is a 78 y.o. female, with history of anemia of chronic disease, ASCVD, AICD placement, history of breast cancer, chronic bronchitis, stage III CKD, systolic heart failure, hyperlipidemia, GERD, gout, hypertension, rheumatoid arthritis who recently had expiratory laparotomy with lysis of adhesions, bowel resection for ischemic bowel in May 2020.  She had large abdominal wound, which is healing by secondary intention.  As per family they noted feces  coming from the wound; s/p eakin pouch 8/21. Pt was followed by SLP during recent admission with recommedation for pureed diet, thin liquids, but pt had very little intake. Type of Study: Bedside Swallow Evaluation Previous Swallow Assessment: see HPI Diet Prior to this Study: NPO;TNA Temperature Spikes Noted: No Respiratory Status: Room air History of Recent Intubation: No Behavior/Cognition: Alert;Requires cueing Oral Care Completed by SLP: No Oral Cavity - Dentition: Adequate natural dentition Vision: Functional for self-feeding Self-Feeding Abilities: Needs assist Patient Positioning: Upright in bed    Oral/Motor/Sensory Function     Ice Chips Ice chips: Not tested   Thin Liquid Thin Liquid: Impaired Presentation: Cup;Straw Pharyngeal  Phase Impairments: Cough - Immediate;Cough - Delayed    Nectar Thick Nectar Thick Liquid: Within functional limits Presentation: Straw   Honey Thick Honey Thick Liquid: Not tested   Puree Puree: Impaired Presentation: Spoon Oral Phase Functional Implications: Prolonged oral transit   Solid     Solid: Not tested      Venita Sheffield Ora Mcnatt 12/16/2018,1:04 PM  Pollyann Glen, M.A. [place] Acute Environmental education officer [phone] Office [phone]

## 2018-12-16 NOTE — Progress Notes (Signed)
Patient ID: [name], female   DOB: 1941-01-14, 78 y.o.   MRN: [id number]       Subjective: No complaints.  Awake today.  Denies pain.  Objective: Vital signs in last 24 hours: Temp:  [97.9 F (36.6 C)-98.4 F (36.9 C)] 97.9 F (36.6 C) (08/25 0412) Pulse Rate:  [84-86] 84 (08/25 0412) Resp:  [17-18] 17 (08/25 0412) BP: (119-129)/(55-58) 124/57 (08/25 0412) SpO2:  [100 %] 100 % (08/25 0412) Last BM Date: 12/15/18  Intake/Output from previous day: 08/24 0701 - 08/25 0700 In: 1924.3 [P.O.:60; I.V.:1864.3] Out: 305 [Urine:300; Drains:5] Intake/Output this shift: No intake/output data recorded.  PE: Abd: soft, NT, ND, +BS, Eakin's pouch in place with minimal tan output.  Nothing bilious noted.  Only 5cc documented yesterday.  Lab Results:  Recent Labs    12/15/18 0405  WBC 6.7  HGB 7.8*  HCT 24.2*  PLT 73*   BMET Recent Labs    12/15/18 0405 12/16/18 0320  NA 139 137  K 4.2 4.7  CL 112* 112*  CO2 22 21*  GLUCOSE 160* 129*  BUN 35* 38*  CREATININE 1.62* 1.30*  CALCIUM 7.4* 7.4*   PT/INR No results for input(s): LABPROT, INR in the last 72 hours. CMP     Component Value Date/Time   NA 137 12/16/2018 0320   K 4.7 12/16/2018 0320   CL 112 (H) 12/16/2018 0320   CO2 21 (L) 12/16/2018 0320   GLUCOSE 129 (H) 12/16/2018 0320   BUN 38 (H) 12/16/2018 0320   CREATININE 1.30 (H) 12/16/2018 0320   CREATININE 1.07 (H) 02/13/2018 1142   CALCIUM 7.4 (L) 12/16/2018 0320   PROT 4.7 (L) 12/15/2018 0405   ALBUMIN <1.0 (L) 12/15/2018 0405   AST 21 12/15/2018 0405   ALT 16 12/15/2018 0405   ALKPHOS 203 (H) 12/15/2018 0405   BILITOT 0.5 12/15/2018 0405   GFRNONAA 39 (L) 12/16/2018 0320   GFRNONAA 49 (L) 02/13/2018 1142   GFRAA 45 (L) 12/16/2018 0320   GFRAA 57 (L) 02/13/2018 1142   Lipase     Component Value Date/Time   LIPASE 45 12/10/2018 1722       Studies/Results: No results found.  Anti-infectives: Anti-infectives (From admission, onward)   Start     Dose/Rate Route Frequency Ordered Stop   12/11/18 1000  piperacillin-tazobactam (ZOSYN) IVPB 3.375 g  Status:  Discontinued     3.375 g 12.5 mL/hr over 240 Minutes Intravenous Every 12 hours 12/10/18 2220 12/11/18 0522   12/11/18 1000  piperacillin-tazobactam (ZOSYN) IVPB 3.375 g  Status:  Discontinued     3.375 g 12.5 mL/hr over 240 Minutes Intravenous Every 12 hours 12/11/18 0524 12/12/18 1122   12/11/18 0800  piperacillin-tazobactam (ZOSYN) IVPB 2.25 g  Status:  Discontinued     2.25 g 100 mL/hr over 30 Minutes Intravenous Every 6 hours 12/11/18 0522 12/11/18 0524   12/10/18 2215  piperacillin-tazobactam (ZOSYN) IVPB 3.375 g     3.375 g 100 mL/hr over 30 Minutes Intravenous  Once 12/10/18 2211 12/10/18 2256       Assessment/Plan HTN HLD Hx of GI bleed GERD CHF COPD Hx of ICD CAD CKD w/ AKI - improving, Cr 1.30 Chronic Anemia - 7.8 (8/24)  EC fistula -no enteric output noted -will monitor closely -speech eval to see what diet she needs to be on and then can place her on a diet.  Hopefully she tolerates this and can wean her to TNA to off. SBO with necrotic/dead small bowel  S/p exploratory laparotomy with lysis of adhesions resection of small bowel and appendectomy 08/21/2018 Dr. Fayrene Fearing  XIV:[id number] to place on diet after eval/TNA ID: Zosyn 8/20. None currently. DVT: SCDs, Heparin Follow-up:Dr. Marlou Starks   LOS: 6 days    [name] , [facility] 12/16/2018, 8:58 AM Pager: [phone]

## 2018-12-16 NOTE — Progress Notes (Signed)
[facility] NOTE   Pharmacy Consult for TPN Indication: EC Fistula  Patient Measurements: Height: '5\' 3"'  (160 cm) Weight: 148 lb (67.1 kg) IBW/kg (Calculated) : 52.4 TPN AdjBW (KG): 56.1 Body mass index is 26.22 kg/m.  Assessment: 78 year old female s/p exploratory laparotomy with lysis of adhesions and bowel resection for ischemic bowel in May 2000. Patient required TPN during that admission but was weaned off to enteral diet. Re-admitted from home on 12/10/18 with feces draining from wound indicating EC fistula and AKI. Patient also with inflammation around her gallbladder. Patient has had ~10.5% weight gain over the past 3 months. Pharmacy consulted for TPN.   GI: EC fistula - Eakin pouch placed. Poor candidate for reoperative surgery. Not much output. Pre-albumin very low at 6.6>6.9. PPI po. Florastor po.  With low output, diet to start today pending speech eval Endo: Hypoglycemia prior to dextrose and TPN. CBGs 129-137 improved Insulin requirements in the past 24 hours: 6 unit SSI Lytes: Na 137, Cl down 112 and CO2 up 21. K 4.7. Phos downtrend to 2.8 Renal: AKI on CKD, SCR improving at 1.84>1.30, BUN 38. UOP 0.2 ml/kg/hr (but not likely recorded over 24h) Pulm: RA Cards: BP improved, HR 80s on Amiodarone and Coreg. Hepatobil: AST/ALT/Tbili/TG wnl. Alk Phos elevated but trending down. Alb 1. Neuro: Pain 0 to 4. NuDESC 1. Remeron every PM.  ID: s/p Zosyn for possible intra-abdominal infection. WBC wnl.   TPN Access: Right IJ tunneled CVC catheter placed 8/21 TPN start date: 12/12/18 Nutritional Goals (per RD recommendation on 8/21): KCal: 1800-2000 Protein: 90-105 grams Fluid: >1.8L  Goal TPN rate is 70 ml/hr (provides 100%)  Current Nutrition:  NPO  TPN   Plan:  Continue TPN at 70 mL/hr  This TPN provides 101 g of protein, 269 g of dextrose, and 54 g of lipids which provides 1854 kCals per day, meeting 100% of protein and kcal  needs Electrolytes in TPN: Continue phos inc, slight K dec. Keep 1:2 LS:LHTDSKA. Add Multivitamin to TPN. Trace elements MWF only.  Continue sensitive SSI and adjust as needed BMP in AM F/u Diet and ability to wean TPN tomorrow  Bertis Ruddy, PharmD Clinical Pharmacist Please check AMION for all [place] numbers 12/16/2018 7:45 AM

## 2018-12-16 NOTE — Progress Notes (Signed)
PROGRESS NOTE  [name] HXT:[id number] DOB: 1941-01-02 DOA: 12/10/2018 PCP: [name], MD  Brief History   [name]  is a 78 y.o. female, with history of anemia of chronic disease, ASCVD, AICD placement, history of breast cancer, chronic bronchitis, stage III CKD, systolic heart failure, hyperlipidemia, GERD, gout, hypertension, rheumatoid arthritis who recently had expiratory laparotomy with lysis of adhesions, bowel resection for ischemic bowel in May 2020.  She had large abdominal wound, which is healing by secondary intention.  As per family they noted feces coming from the wound.  No abdominal pain or fever.  Patient was brought to the hospital for further evaluation.  Patient is a poor historian.  History obtained from ED records.   CT of the abdomen pelvis was done, no results are available in epic due to technical issues.  ED physician spoke to radiologist verbally about the results.     As per ED physician, there is an enterocutaneous fistula and inflammation around the gallbladder.  No acute cholecystitis.  General surgery, Dr. Rosendo Gros was consulted, he recommended no immediate surgery at this time.  Recommended patient to transfer to [facility] and surgery have been consulted again and have seen patient this morning. The patient now has an Eakins pouch, wound care, and TPN. Cental venous catheter has been placed.  Lynann Beaver pouch has been placed and has had low output. If she continues to have low output, the patient may be allowed to eat, per surgery, and discharged soon.  Consultants  . General Surgery . Wound care  Procedures  . PICC line placement.  Antibiotics   Anti-infectives (From admission, onward)   Start     Dose/Rate Route Frequency Ordered Stop   12/11/18 1000  piperacillin-tazobactam (ZOSYN) IVPB 3.375 g  Status:  Discontinued     3.375 g 12.5 mL/hr over 240 Minutes Intravenous Every 12 hours 12/10/18 2220 12/11/18 0522   12/11/18 1000   piperacillin-tazobactam (ZOSYN) IVPB 3.375 g  Status:  Discontinued     3.375 g 12.5 mL/hr over 240 Minutes Intravenous Every 12 hours 12/11/18 0524 12/12/18 1122   12/11/18 0800  piperacillin-tazobactam (ZOSYN) IVPB 2.25 g  Status:  Discontinued     2.25 g 100 mL/hr over 30 Minutes Intravenous Every 6 hours 12/11/18 0522 12/11/18 0524   12/10/18 2215  piperacillin-tazobactam (ZOSYN) IVPB 3.375 g     3.375 g 100 mL/hr over 30 Minutes Intravenous  Once 12/10/18 2211 12/10/18 2256      Subjective  The patient has been vomiting this morning. She states that she is sick.  Objective   Vitals:  Vitals:   12/16/18 0412 12/16/18 1242  BP: (!) 124/57 (!) 115/48  Pulse: 84 78  Resp: 17 19  Temp: 97.9 F (36.6 C) 98.4 F (36.9 C)  SpO2: 100% 100%    Exam:  Constitutional:  . The patient is awake, alert, and oriented x 3. No acute distress. Non-verbal for me. Respiratory:  . No increased work of breathing. . No wheezes, rales, or rhonchi. . No tactile fremitus. Cardiovascular:  . Regular rate and rhythm. . No murmurs, ectopy, or gallups. . No lateral PMI. No thrills. Abdomen:  . Abdomen is soft, non-tender, non-distended. . No hernias, masses, or organomegaly. . Hypoactive bowel sounds . Eakins pouch in place. Musculoskeletal:  . No cyanosis, clubbing, or edema Skin:  . No rashes, lesions, ulcers . palpation of skin: no induration or nodules Neurologic:  . CN 2-12 intact . Sensation all 4 extremities intact  Psychiatric:  . Mental status o Mood, affect appropriate o Orientation to person, place, time  . judgment and insight appear intact     I have personally reviewed the following:   Today's Data  . CMP, CBC, Vitals  Micro Data  . Nares MSSA and MRSA positive.  Imaging  . CT abdomen: Entero-cutaneous fistula.  Scheduled Meds: . amiodarone  200 mg Oral Daily  . carvedilol  3.125 mg Oral BID WC  . Chlorhexidine Gluconate Cloth  6 each Topical Daily  .  heparin  5,000 Units Subcutaneous Q8H  . insulin aspart  0-9 Units Subcutaneous Q4H  . mirtazapine  15 mg Oral QPM  . mupirocin ointment  1 application Nasal BID  . pantoprazole  40 mg Oral Daily  . saccharomyces boulardii  250 mg Oral BID   Continuous Infusions: . sodium chloride 10 mL/hr at 12/16/18 0600  . TPN ADULT (ION) 70 mL/hr at 12/16/18 0600  . TPN ADULT (ION)      Active Problems:   AKI (acute kidney injury) ([place])   Unstageable pressure ulcer of sacral region ([place])   LOS: 6 days   A & P  Acute kidney injury-patient presented with acute kidney injury with creatinine 2.94, her baseline creatinine as of November 12, 2018 was 1.89.  Started on 0.45% normal saline at 100 mL/h as patient also had hypernatremia with sodium 156. Sodium today is 138. Creatinine is 2.28 today. Follow creatinine, electrolytes, and volume status carefully.   Hypernatremia: Resolved with TPN. Sodium is 137 today.   Loose BM's: 4 loose bm's today. Likely due to no solid food for some time. Nursing is not suspicious the C Diff is the source. She states that they are small in volume. Will monitor electrolytes and volume status.  Hypokalemia: Supplement. Monitor.   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  No immediate surgical intervention required.  General surgery consulted and Eakins pouch in place. They have recommended CT again later this week. Lynann Beaver pouch has been placed and has had low output. If she continues to have low output, the patient may be allowed to eat, per surgery, and discharged soon. I am unable to determine if this fisula is a complication of her surgery or not.  Unstageable ulcer of the sacrum and Stage II pressure ulcer on buttock: Present on admission. Wound care consulted.   Cholecystitis-CT scan showed inflammation around gallbladder.  Will obtain abdominal ultrasound in a.m.  Zosyn has been discontinued.    History of wide-complex tachycardia/CAD-continue amiodarone, Coreg.   Diabetes mellitus/hypoglycemia: IV D10 with IV 1//2 NS. Monitor. HbA1c 4.7.  I have seen and examined this patient myself. I have spent 32 minutes in her evaluation and care.  DVT prophylaxis: Heparin Code Status: Full Code Family Communication: No family present. Disposition Plan: tbd.    Pranish Akhavan, DO [facility] Direct contact: see [email or web address]  7PM-7AM contact night coverage as above 12/16/2018, 2:40 PM  LOS: 1 day

## 2018-12-17 LAB — GLUCOSE, CAPILLARY
Glucose-Capillary: 107 mg/dL — ABNORMAL HIGH (ref 70–99)
Glucose-Capillary: 115 mg/dL — ABNORMAL HIGH (ref 70–99)
Glucose-Capillary: 116 mg/dL — ABNORMAL HIGH (ref 70–99)
Glucose-Capillary: 121 mg/dL — ABNORMAL HIGH (ref 70–99)
Glucose-Capillary: 125 mg/dL — ABNORMAL HIGH (ref 70–99)
Glucose-Capillary: 157 mg/dL — ABNORMAL HIGH (ref 70–99)
Glucose-Capillary: 90 mg/dL (ref 70–99)

## 2018-12-17 LAB — BASIC METABOLIC PANEL
Anion gap: 4 — ABNORMAL LOW (ref 5–15)
BUN: 46 mg/dL — ABNORMAL HIGH (ref 8–23)
CO2: 21 mmol/L — ABNORMAL LOW (ref 22–32)
Calcium: 7.6 mg/dL — ABNORMAL LOW (ref 8.9–10.3)
Chloride: 113 mmol/L — ABNORMAL HIGH (ref 98–111)
Creatinine, Ser: 1.28 mg/dL — ABNORMAL HIGH (ref 0.44–1.00)
GFR calc Af Amer: 46 mL/min — ABNORMAL LOW (ref >60)
GFR calc non Af Amer: 39 mL/min — ABNORMAL LOW (ref >60)
Glucose, Bld: 133 mg/dL — ABNORMAL HIGH (ref 70–99)
Potassium: 5.1 mmol/L (ref 3.5–5.1)
Sodium: 138 mmol/L (ref 135–145)

## 2018-12-17 MED ORDER — ENSURE ENLIVE PO LIQD
237.0000 mL | Freq: Two times a day (BID) | ORAL | Status: DC
  Administered 2018-12-17 – 2018-12-19 (×6): 237 mL via ORAL

## 2018-12-17 MED ORDER — TRAVASOL 10 % IV SOLN
INTRAVENOUS | Status: AC
  Administered 2018-12-17: 17:00:00 via INTRAVENOUS
  Filled 2018-12-17: qty 504

## 2018-12-17 MED ORDER — ADULT MULTIVITAMIN W/MINERALS CH
1.0000 | ORAL_TABLET | Freq: Every day | ORAL | Status: DC
  Administered 2018-12-18 – 2018-12-31 (×14): 1 via ORAL
  Filled 2018-12-17 (×15): qty 1

## 2018-12-17 NOTE — Progress Notes (Signed)
  Speech Language Pathology Treatment: Dysphagia  Patient Details Name: [name] MRN: [id number] DOB: Nov 28, 1940 Today's Date: 12/17/2018 Time: 4627-0350 SLP Time Calculation (min) (ACUTE ONLY): 20 min  Assessment / Plan / Recommendation Clinical Impression  Pt was talkative and practiced self-advocacy in regards to her wants and needs. She refused to eat her puree tray but said she was willing to eat her ice cream once it was melted. Upgraded PO trials of thin liquids and solid were given by SLP, pt would only drink soda. Given thin liquids with a straw she showed improvement from the previous session, not showing s/sx of aspiration on every attempt. She produced  a delayed throat clear and cough 2x.  Given a cracker, she held it and required cueing to chew. She had difficulty with mastication, possibly due to bottom dentures being absent. Ice cream was successful in clearing residue. ay benefit from MBS to fully assess swallowing fx. Recommend continuing with current D1/ nectar thick diet with continued f/u tx from SLP.     HPI HPI: Pt is a 78 y.o. female, with history of anemia of chronic disease, ASCVD, AICD placement, history of breast cancer, chronic bronchitis, stage III CKD, systolic heart failure, hyperlipidemia, GERD, gout, hypertension, rheumatoid arthritis who recently had expiratory laparotomy with lysis of adhesions, bowel resection for ischemic bowel in May 2020.  She had large abdominal wound, which is healing by secondary intention.  As per family they noted feces coming from the wound; s/p eakin pouch 8/21. Pt was followed by SLP during recent admission with recommedation for pureed diet, thin liquids, but pt had very little intake.      SLP Plan  Continue with current plan of care       Recommendations  Diet recommendations: Dysphagia 1 (puree);Nectar-thick liquid Liquids provided via: Straw Medication Administration: Crushed with puree Supervision: Staff to assist  with self feeding Compensations: Slow rate;Small sips/bites Postural Changes and/or Swallow Maneuvers: Upright 30-60 min after meal;Seated upright 90 degrees                Oral Care Recommendations: Oral care BID SLP Visit Diagnosis: Dysphagia, unspecified (R13.10) Plan: Continue with current plan of care                       Gustabo Gordillo 12/17/2018, 4:53 PM

## 2018-12-17 NOTE — Progress Notes (Signed)
Nutrition Follow-up  DOCUMENTATION CODES:   Not applicable  INTERVENTION:   -MVI with minerals daily (start 12/18/18 if TPN d/c) -Ensure Enlive po BID, each supplement provides 350 kcal and 20 grams of protein -Magic cup TID with meals, each supplement provides 290 kcal and 9 grams of protein -Hormel shake TID with meals, each supplement provides 520 kcals and 22 grams protein  NUTRITION DIAGNOSIS:   Inadequate oral intake related to altered GI function as evidenced by NPO status.  Progressing; advanced to dysphagia 1 diet with nectar thick liquids  GOAL:   Patient will meet greater than or equal to 90% of their needs  Met with TPN  MONITOR:   Diet advancement, Labs, Weight trends, Skin, I & O's  REASON FOR ASSESSMENT:   Consult New TPN/TNA  ASSESSMENT:   [name]  is a 78 y.o. female, with history of anemia of chronic disease, ASCVD, AICD placement, history of breast cancer, chronic bronchitis, stage III CKD, systolic heart failure, hyperlipidemia, GERD, gout, hypertension, rheumatoid arthritis who recently had expiratory laparotomy with lysis of adhesions, bowel resection for ischemic bowel in May 2020.  She had large abdominal wound, which is healing by secondary intention.  As per family they noted feces coming from the wound.  No abdominal pain or fever.  Patient was brought to the hospital for further evaluation.  Patient is a poor historian.  History obtained from ED records.  8/21- CWOCN placed eakin pouch on EC fistula; midline catheter placed; tunnelled CVC placed; TPN initiated 8/25- s/p BSE- advanced to dysphagia 1 diet with nectar thick liquids  Reviewed I/O's: +889 ml x 24 hours and +7.2 L since admission  UOP: 775 ml x 24 hours  Drain output: 40 ml x 24 hours  Per general surgery notes, pt continues with low fistula output. Considering weaning TPN today. Pt with poor oral intake; noted meal completion 15%.   Pt currently receivign TPN at 70 ml/hr,  which provides 1854 kcals and 101 grams protein, meeting 100% of estimated energy needs.   Medications reviewed and include remeron and florastor.   Labs reviewed: CBGS: 115-147 (inpatient orders for glycemic control are 0-9 units insulin aspart every 4 hours).   Diet Order:   Diet Order            DIET - DYS 1 Room service appropriate? Yes with Assist; Fluid consistency: Nectar Thick  Diet effective now              EDUCATION NEEDS:   Not appropriate for education at this time  Skin:  Skin Assessment: Skin Integrity Issues: Skin Integrity Issues:: Stage II, Other (Comment) Stage II: buttocks Other: open abdomen with draining fistula- eakin pouch placed  Last BM:  12/16/18  Height:   Ht Readings from Last 1 Encounters:  12/10/18 '5\' 3"'  (1.6 m)    Weight:   Wt Readings from Last 1 Encounters:  12/10/18 67.1 kg    Ideal Body Weight:  52.3 kg  BMI:  Body mass index is 26.22 kg/m.  Estimated Nutritional Needs:   Kcal:  1800-2000  Protein:  90-105 grams  Fluid:  > 1.8 L    [place] A. Jimmye Norman, RD, LDN, [place] Registered Dietitian II Certified Diabetes Care and Education Specialist Pager: [phone] After hours Pager: [phone]

## 2018-12-17 NOTE — Progress Notes (Signed)
Patient ID: [name], female   DOB: 1940/05/26, 78 y.o.   MRN: [id number]       Subjective: Denies abdominal pain.  Says she ate some of her food yesterday.  Denies nausea today.    Objective: Vital signs in last 24 hours: Temp:  [98.2 F (36.8 C)-98.8 F (37.1 C)] 98.8 F (37.1 C) (08/26 0606) Pulse Rate:  [76-79] 79 (08/26 0606) Resp:  [16-19] 16 (08/26 0606) BP: (109-120)/(46-53) 109/46 (08/26 0606) SpO2:  [100 %] 100 % (08/26 0606) Last BM Date: 12/16/18  Intake/Output from previous day: 08/25 0701 - 08/26 0700 In: 1704.2 [P.O.:180; I.V.:1524.2] Out: 815 [Urine:775; Drains:40] Intake/Output this shift: No intake/output data recorded.  PE: Abd: soft, NT, ND, +BS, Eakin's in place with no bilious/eneteric output.  Small amount of tan output noted.  Only minimal output documented.    Lab Results:  Recent Labs    12/15/18 0405  WBC 6.7  HGB 7.8*  HCT 24.2*  PLT 73*   BMET Recent Labs    12/16/18 0320 12/17/18 0356  NA 137 138  K 4.7 5.1  CL 112* 113*  CO2 21* 21*  GLUCOSE 129* 133*  BUN 38* 46*  CREATININE 1.30* 1.28*  CALCIUM 7.4* 7.6*   PT/INR No results for input(s): LABPROT, INR in the last 72 hours. CMP     Component Value Date/Time   NA 138 12/17/2018 0356   K 5.1 12/17/2018 0356   CL 113 (H) 12/17/2018 0356   CO2 21 (L) 12/17/2018 0356   GLUCOSE 133 (H) 12/17/2018 0356   BUN 46 (H) 12/17/2018 0356   CREATININE 1.28 (H) 12/17/2018 0356   CREATININE 1.07 (H) 02/13/2018 1142   CALCIUM 7.6 (L) 12/17/2018 0356   PROT 4.7 (L) 12/15/2018 0405   ALBUMIN <1.0 (L) 12/15/2018 0405   AST 21 12/15/2018 0405   ALT 16 12/15/2018 0405   ALKPHOS 203 (H) 12/15/2018 0405   BILITOT 0.5 12/15/2018 0405   GFRNONAA 39 (L) 12/17/2018 0356   GFRNONAA 49 (L) 02/13/2018 1142   GFRAA 46 (L) 12/17/2018 0356   GFRAA 57 (L) 02/13/2018 1142   Lipase     Component Value Date/Time   LIPASE 45 12/10/2018 1722       Studies/Results: No results found.   Anti-infectives: Anti-infectives (From admission, onward)   Start     Dose/Rate Route Frequency Ordered Stop   12/11/18 1000  piperacillin-tazobactam (ZOSYN) IVPB 3.375 g  Status:  Discontinued     3.375 g 12.5 mL/hr over 240 Minutes Intravenous Every 12 hours 12/10/18 2220 12/11/18 0522   12/11/18 1000  piperacillin-tazobactam (ZOSYN) IVPB 3.375 g  Status:  Discontinued     3.375 g 12.5 mL/hr over 240 Minutes Intravenous Every 12 hours 12/11/18 0524 12/12/18 1122   12/11/18 0800  piperacillin-tazobactam (ZOSYN) IVPB 2.25 g  Status:  Discontinued     2.25 g 100 mL/hr over 30 Minutes Intravenous Every 6 hours 12/11/18 0522 12/11/18 0524   12/10/18 2215  piperacillin-tazobactam (ZOSYN) IVPB 3.375 g     3.375 g 100 mL/hr over 30 Minutes Intravenous  Once 12/10/18 2211 12/10/18 2256       Assessment/Plan HTN HLD Hx of GI bleed GERD CHF COPD Hx of ICD CAD CKD w/ AKI - improving, Cr 1.30 Chronic Anemia - 7.8 (8/24)  EC fistula -no enteric output noted -on D1 diet per speech recommendations -no plans for any surgical intervention.  Would consider weaning TNA to off and maintaining a diet as she  was prior to admission.  As able to do this, pending no change, she may return when above completed   S/p exploratory laparotomy with lysis of adhesions resection of small bowel and appendectomy 08/21/2018 Dr. Eddie Dibbles TothIII  FEN:D1 diet, recommend weaning TNA ID: Zosyn 8/20. None currently. DVT: SCDs, Heparin Follow-up:Dr. Marlou Starks   LOS: 7 days    [name] , [facility] 12/17/2018, 9:33 AM Pager: [phone]

## 2018-12-17 NOTE — Progress Notes (Signed)
[place] NOTE   Pharmacy Consult for TPN Indication: EC Fistula  Patient Measurements: Height: 5' 3" (160 cm) Weight: 148 lb (67.1 kg) IBW/kg (Calculated) : 52.4 TPN AdjBW (KG): 56.1 Body mass index is 26.22 kg/m.  Assessment: 78 year old female s/p exploratory laparotomy with lysis of adhesions and bowel resection for ischemic bowel in May 2000. Patient required TPN during that admission but was weaned off to enteral diet. Re-admitted from home on 12/10/18 with feces draining from wound indicating EC fistula and AKI. Patient also with inflammation around her gallbladder. Patient has had ~10.5% weight gain over the past 3 months. Pharmacy consulted for TPN.   GI: EC fistula - Eakin pouch placed. Poor candidate for reoperative surgery. Not much output. Pre-albumin very low at 6.6>6.9. PPI po. Florastor po.  Increased drain output over last 24h Now on D1 diet, ~15% meals eaten on first day, tolerating well Endo: Hypoglycemia prior to dextrose and TPN. CBGs 125-147 improved Insulin requirements in the past 24 hours: 3 unit SSI Lytes: Na 138, Cl down 113 and CO2 up 21. K 5.1. Phos downtrend to 2.8 Renal: AKI on CKD, SCR improving at 1.84>1.28, BUN 46. UOP 0.5 ml/kg/hr  Pulm: RA Cards: BP improved, HR 80s on Amiodarone and Coreg. Hepatobil: AST/ALT/Tbili/TG wnl. Alk Phos elevated but trending down. Alb 1. Neuro: Pain 0 to 4. NuDESC 1. Remeron every PM.  ID: s/p Zosyn for possible intra-abdominal infection. WBC wnl.   TPN Access: Right IJ tunneled CVC catheter placed 8/21 TPN start date: 12/12/18 Nutritional Goals (per RD recommendation on 8/21): KCal: 1800-2000 Protein: 90-105 grams Fluid: >1.8L  Goal TPN rate is 70 ml/hr (provides 100%)  Current Nutrition:  D1 diet started 8/25 (Ensure Enlive BID + magic cup TID + Hormel shake TID) TPN   Plan:  Decrease TPN to 35 mL/hr - weaning to stimulate appetite with current diet This TPN provides 50  g of protein, 134 g of dextrose, and 27 g of lipids which provides 927 kCals per day, meeting 50% of protein and kcal needs through TPN alone Electrolytes in TPN: Continue phos inc, Decrease K. To max PP:IRJJOAC. Take out Multivitamin in TPN as PO started Trace elements MWF only.  Continue sensitive SSI and adjust as needed - May d/c if trend continues TPN labs in AM F/u Diet and ability to d/c TPN  Bertis Ruddy, PharmD Clinical Pharmacist Please check AMION for all [place] numbers 12/17/2018 7:17 AM

## 2018-12-17 NOTE — Progress Notes (Addendum)
PROGRESS NOTE  ERMA RAICHE ZOX:[id number] DOB: [birth year] DOA: 12/10/2018 PCP: [name], MD  Brief History   [name]  is a 78 y.o. female, with history of anemia of chronic disease, ASCVD, AICD placement, history of breast cancer, chronic bronchitis, stage III CKD, systolic heart failure, hyperlipidemia, GERD, gout, hypertension, rheumatoid arthritis who recently had expiratory laparotomy with lysis of adhesions, bowel resection for ischemic bowel in May 2020.  She had large abdominal wound, which is healing by secondary intention.  As per family they noted feces coming from the wound.  No abdominal pain or fever.  Patient was brought to the hospital for further evaluation.  Patient is a poor historian.  History obtained from ED records.   CT of the abdomen pelvis was done, no results are available in epic due to technical issues.  ED physician spoke to radiologist verbally about the results.     As per ED physician, there is an enterocutaneous fistula and inflammation around the gallbladder.  No acute cholecystitis.  General surgery, Dr. Rosendo Gros was consulted, he recommended no immediate surgery at this time.  Recommended patient to transfer to [facility] and surgery have been consulted again and have seen patient this morning. The patient now has an Eakins pouch, wound care, and TPN. Cental venous catheter has been placed.  Lynann Beaver pouch has been placed and has had low output. If she continues to have low output, and can be weaned off of TPN to an oral diet, the patient may be discharged soon.  Consultants  . General Surgery . Wound care  Procedures  . PICC line placement.  Antibiotics   Anti-infectives (From admission, onward)   Start     Dose/Rate Route Frequency Ordered Stop   12/11/18 1000  piperacillin-tazobactam (ZOSYN) IVPB 3.375 g  Status:  Discontinued     3.375 g 12.5 mL/hr over 240 Minutes Intravenous Every 12 hours 12/10/18 2220 12/11/18 0522   12/11/18 1000   piperacillin-tazobactam (ZOSYN) IVPB 3.375 g  Status:  Discontinued     3.375 g 12.5 mL/hr over 240 Minutes Intravenous Every 12 hours 12/11/18 0524 12/12/18 1122   12/11/18 0800  piperacillin-tazobactam (ZOSYN) IVPB 2.25 g  Status:  Discontinued     2.25 g 100 mL/hr over 30 Minutes Intravenous Every 6 hours 12/11/18 0522 12/11/18 0524   12/10/18 2215  piperacillin-tazobactam (ZOSYN) IVPB 3.375 g     3.375 g 100 mL/hr over 30 Minutes Intravenous  Once 12/10/18 2211 12/10/18 2256      Subjective  The patient states that she is feeling better this morning. No new complaints.  Objective   Vitals:  Vitals:   12/17/18 1607 12/17/18 1717  BP: (!) 119/42 (!) 105/49  Pulse: 73 79  Resp:    Temp: 98.8 F (37.1 C)   SpO2: 100%     Exam:  Constitutional:  . The patient is awake, alert, and oriented x 3. No acute distress. conversational Respiratory:  . No increased work of breathing. . No wheezes, rales, or rhonchi. . No tactile fremitus. Cardiovascular:  . Regular rate and rhythm. . No murmurs, ectopy, or gallups. . No lateral PMI. No thrills. Abdomen:  . Abdomen is soft, non-tender, non-distended. . No hernias, masses, or organomegaly. . Hypoactive bowel sounds . Eakins pouch in place. Musculoskeletal:  . No cyanosis, clubbing, or edema Skin:  . No rashes, lesions, ulcers . palpation of skin: no induration or nodules Neurologic:  . CN 2-12 intact . Sensation all 4 extremities intact  Psychiatric:  . Mental status o Mood, affect appropriate o Orientation to person, place, time  . judgment and insight appear intact     I have personally reviewed the following:   Today's Data  . CMP, Vitals  Micro Data  . Nares MSSA and MRSA positive.  Imaging  . CT abdomen: Entero-cutaneous fistula.  Scheduled Meds: . amiodarone  200 mg Oral Daily  . carvedilol  3.125 mg Oral BID WC  . Chlorhexidine Gluconate Cloth  6 each Topical Daily  . feeding supplement (ENSURE  ENLIVE)  237 mL Oral BID BM  . heparin  5,000 Units Subcutaneous Q8H  . insulin aspart  0-9 Units Subcutaneous Q4H  . mirtazapine  15 mg Oral QPM  . [START ON 12/18/2018] multivitamin with minerals  1 tablet Oral Daily  . pantoprazole  40 mg Oral Daily  . saccharomyces boulardii  250 mg Oral BID   Continuous Infusions: . sodium chloride 10 mL/hr at 12/16/18 0600  . TPN ADULT (ION) 35 mL/hr at 12/17/18 1701    Active Problems:   AKI (acute kidney injury) ([place])   Unstageable pressure ulcer of sacral region ([place])   LOS: 7 days   A & P  Acute kidney injury-patient presented with acute kidney injury with creatinine 2.94, her baseline creatinine as of November 12, 2018 was 1.89.  Started on 0.45% normal saline at 100 mL/h as patient also had hypernatremia with sodium 156. Sodium today is 138. Creatinine is 1.28 today. Follow creatinine, electrolytes, and volume status carefully.   Hypernatremia: Resolved with TPN. Sodium is 138 today.   Loose BM's: 4 loose bm's today. Likely due to no solid food for some time. Nursing is not suspicious the C Diff is the source. She states that they are small in volume. Will monitor electrolytes and volume status.  Hypokalemia: Resolved. Monitor and supplement as necessary.   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  No immediate surgical intervention required.  General surgery consulted and Eakins pouch in place. They have recommended CT again later this week. Lynann Beaver pouch has been placed and has had low output. If she continues to have low output, the patient may be allowed to eat, per surgery, and discharged soon. I am unable to determine if this fisula is a complication of her surgery or not. Surgery states that the patient is having low output from fistula. They recommend weaning her off of TPN to oral diet. She may then be discharged.  Unstageable ulcer of the sacrum and Stage II pressure  ulcer on buttock: Present on admission. Wound care consulted.   Cholecystitis-CT scan showed inflammation around gallbladder.  Will obtain abdominal ultrasound in a.m. No surgical intervention is planned.  Zosyn has been discontinued.   History of wide-complex tachycardia/CAD-continue amiodarone, Coreg.   Diabetes mellitus/hypoglycemia: IV D10 with IV 1//2 NS. Monitor. HbA1c 4.7.  I have seen and examined this patient myself. I have spent 32 minutes in her evaluation and care.  DVT prophylaxis: Heparin Code Status: Full Code Family Communication: No family present. Disposition Plan: tbd.    Donato Studley, DO [facility] Direct contact: see [email or web address]  7PM-7AM contact night coverage as above 12/17/2018, 2:40 PM  LOS: 1 day

## 2018-12-18 ENCOUNTER — Inpatient Hospital Stay (HOSPITAL_COMMUNITY): Payer: Medicare Other

## 2018-12-18 LAB — GLUCOSE, CAPILLARY
Glucose-Capillary: 92 mg/dL (ref 70–99)
Glucose-Capillary: 87 mg/dL (ref 70–99)
Glucose-Capillary: 117 mg/dL — ABNORMAL HIGH (ref 70–99)
Glucose-Capillary: 126 mg/dL — ABNORMAL HIGH (ref 70–99)
Glucose-Capillary: 100 mg/dL — ABNORMAL HIGH (ref 70–99)
Glucose-Capillary: 82 mg/dL (ref 70–99)

## 2018-12-18 LAB — COMPREHENSIVE METABOLIC PANEL
ALT: 13 U/L (ref 0–44)
AST: 26 U/L (ref 15–41)
Albumin: <1 g/dL — ABNORMAL LOW (ref 3.5–5.0)
Alkaline Phosphatase: 136 U/L — ABNORMAL HIGH (ref 38–126)
Anion gap: 4 — ABNORMAL LOW (ref 5–15)
BUN: 52 mg/dL — ABNORMAL HIGH (ref 8–23)
CO2: 22 mmol/L (ref 22–32)
Calcium: 7.9 mg/dL — ABNORMAL LOW (ref 8.9–10.3)
Chloride: 113 mmol/L — ABNORMAL HIGH (ref 98–111)
Creatinine, Ser: 1.32 mg/dL — ABNORMAL HIGH (ref 0.44–1.00)
GFR calc Af Amer: 44 mL/min — ABNORMAL LOW (ref >60)
GFR calc non Af Amer: 38 mL/min — ABNORMAL LOW (ref >60)
Glucose, Bld: 92 mg/dL (ref 70–99)
Potassium: 5 mmol/L (ref 3.5–5.1)
Sodium: 139 mmol/L (ref 135–145)
Total Bilirubin: 0.7 mg/dL (ref 0.3–1.2)
Total Protein: 4.4 g/dL — ABNORMAL LOW (ref 6.5–8.1)

## 2018-12-18 LAB — MAGNESIUM: Magnesium: 1.8 mg/dL (ref 1.7–2.4)

## 2018-12-18 LAB — PHOSPHORUS: Phosphorus: 3.6 mg/dL (ref 2.5–4.6)

## 2018-12-18 NOTE — Progress Notes (Signed)
Patient ID: [name], female   DOB: May 28, 1940, 78 y.o.   MRN: [id number]       Subjective: No complaints.  Says she has to pee.  Unclear how much she is eating.    Objective: Vital signs in last 24 hours: Temp:  [97.7 F (36.5 C)-98.8 F (37.1 C)] 97.7 F (36.5 C) (08/27 0405) Pulse Rate:  [70-79] 70 (08/27 0405) Resp:  [16-18] 16 (08/27 0405) BP: (101-119)/(41-49) 101/41 (08/27 0405) SpO2:  [100 %] 100 % (08/27 0405) Last BM Date: 12/17/18  Intake/Output from previous day: 08/26 0701 - 08/27 0700 In: 1069.9 [I.V.:1069.9] Out: -  Intake/Output this shift: No intake/output data recorded.  PE: Abd: soft, NT, ND, +BS, Eakin's pouch in place with tan cloudy output.  Not enteric.  No output documented yesterday, but small amount noted like today.  Lab Results:  No results for input(s): WBC, HGB, HCT, PLT in the last 72 hours. BMET Recent Labs    12/17/18 0356 12/18/18 0433  NA 138 139  K 5.1 5.0  CL 113* 113*  CO2 21* 22  GLUCOSE 133* 92  BUN 46* 52*  CREATININE 1.28* 1.32*  CALCIUM 7.6* 7.9*   PT/INR No results for input(s): LABPROT, INR in the last 72 hours. CMP     Component Value Date/Time   NA 139 12/18/2018 0433   K 5.0 12/18/2018 0433   CL 113 (H) 12/18/2018 0433   CO2 22 12/18/2018 0433   GLUCOSE 92 12/18/2018 0433   BUN 52 (H) 12/18/2018 0433   CREATININE 1.32 (H) 12/18/2018 0433   CREATININE 1.07 (H) 02/13/2018 1142   CALCIUM 7.9 (L) 12/18/2018 0433   PROT 4.4 (L) 12/18/2018 0433   ALBUMIN <1.0 (L) 12/18/2018 0433   AST 26 12/18/2018 0433   ALT 13 12/18/2018 0433   ALKPHOS 136 (H) 12/18/2018 0433   BILITOT 0.7 12/18/2018 0433   GFRNONAA 38 (L) 12/18/2018 0433   GFRNONAA 49 (L) 02/13/2018 1142   GFRAA 44 (L) 12/18/2018 0433   GFRAA 57 (L) 02/13/2018 1142   Lipase     Component Value Date/Time   LIPASE 45 12/10/2018 1722       Studies/Results: No results found.  Anti-infectives: Anti-infectives (From admission, onward)   Start     Dose/Rate Route Frequency Ordered Stop   12/11/18 1000  piperacillin-tazobactam (ZOSYN) IVPB 3.375 g  Status:  Discontinued     3.375 g 12.5 mL/hr over 240 Minutes Intravenous Every 12 hours 12/10/18 2220 12/11/18 0522   12/11/18 1000  piperacillin-tazobactam (ZOSYN) IVPB 3.375 g  Status:  Discontinued     3.375 g 12.5 mL/hr over 240 Minutes Intravenous Every 12 hours 12/11/18 0524 12/12/18 1122   12/11/18 0800  piperacillin-tazobactam (ZOSYN) IVPB 2.25 g  Status:  Discontinued     2.25 g 100 mL/hr over 30 Minutes Intravenous Every 6 hours 12/11/18 0522 12/11/18 0524   12/10/18 2215  piperacillin-tazobactam (ZOSYN) IVPB 3.375 g     3.375 g 100 mL/hr over 30 Minutes Intravenous  Once 12/10/18 2211 12/10/18 2256       Assessment/Plan HTN HLD Hx of GI bleed GERD CHF COPD Hx of ICD CAD CKD w/ AKI - improving, Cr 1.32 Chronic Anemia - 7.8 (8/24)  EC fistula -no enteric output noted -on D1 diet per speech recommendations, ensure ordered as well. -no plans for any surgical intervention.  wean TNA to off as she did not come in on TNA.  Given her overall medical state, I suspect she  did not eat much prior to her admission.  Would continue conservative management at this time.  She is surgically stable for DC when felt to be medically stable.  She has follow up arranged with Dr. Marlou Starks in 2 weeks to check her abdomen.  S/p exploratory laparotomy with lysis of adhesions resection of small bowel and appendectomy 08/21/2018 Dr. Eddie Dibbles TothIII  FEN:D1 diet, weaning TNA ID: Zosyn 8/20. None currently. DVT: SCDs, Heparin Follow-up:Dr. Marlou Starks   LOS: 8 days    [name] , [facility] Surgery 12/18/2018, 8:50 AM Pager: [phone]

## 2018-12-18 NOTE — Progress Notes (Signed)
PROGRESS NOTE  TAKERRA Holden QHU:[id number] DOB: 08-Sep-1940 DOA: 12/10/2018 PCP: Savannah Fire, MD  Brief History   [name]  is a 78 y.o. female, with history of anemia of chronic disease, ASCVD, AICD placement, history of breast cancer, chronic bronchitis, stage III CKD, systolic heart failure, hyperlipidemia, GERD, gout, hypertension, rheumatoid arthritis who recently had expiratory laparotomy with lysis of adhesions, bowel resection for ischemic bowel in May 2020.  She had large abdominal Savannah, which is healing by secondary intention.  As per family they noted feces coming from the Savannah.  No abdominal pain or fever.  Patient was brought to the hospital for further evaluation.  Patient is a poor historian.  History obtained from [name].   CT of the abdomen pelvis was done, no results are available in epic due to technical issues.  [name] physician spoke to radiologist verbally about the results.     As per [name] physician, there is an enterocutaneous fistula and inflammation around the gallbladder.  No acute cholecystitis.  [name], [name] was consulted, he recommended no immediate Holden at this time.  Recommended patient to transfer to [name] and Holden have been consulted again and have seen patient this morning. The patient now has an Eakins pouch, [name], and TPN. Cental venous catheter has been placed.  [name] pouch has been placed and has had low output. If she continues to have low output, and can be weaned off of TPN to an oral diet, the patient may be discharged soon. However, currently she is only ingesting about 15% of her meals. Calorie count in place.  Consultants  . [name] . [name]  Procedures  . PICC line placement.  Antibiotics   Anti-infectives (From admission, onward)   Start     Dose/Rate Route Frequency Ordered Stop   12/11/18 1000  piperacillin-tazobactam (ZOSYN) IVPB 3.375 g  Status:  Discontinued     3.375 g 12.5 mL/hr over  240 Minutes Intravenous Every 12 hours 12/10/18 2220 12/11/18 0522   12/11/18 1000  piperacillin-tazobactam (ZOSYN) IVPB 3.375 g  Status:  Discontinued     3.375 g 12.5 mL/hr over 240 Minutes Intravenous Every 12 hours 12/11/18 0524 12/12/18 1122   12/11/18 0800  piperacillin-tazobactam (ZOSYN) IVPB 2.25 g  Status:  Discontinued     2.25 g 100 mL/hr over 30 Minutes Intravenous Every 6 hours 12/11/18 0522 12/11/18 0524   12/10/18 2215  piperacillin-tazobactam (ZOSYN) IVPB 3.375 g     3.375 g 100 mL/hr over 30 Minutes Intravenous  Once 12/10/18 2211 12/10/18 2256      Subjective  The patient is resting comfortably this morning. No new complaints.  Objective   Vitals:  Vitals:   12/18/18 1053 12/18/18 1216  BP: (!) 122/55 (!) 120/55  Pulse: 74 78  Resp: 20 18  Temp:  98.6 F (37 C)  SpO2: 100% 99%    Exam:  Constitutional:  . The patient is awake, alert, and oriented x 3. No acute distress.  Respiratory:  . No increased work of breathing. . No wheezes, rales, or rhonchi. . No tactile fremitus. Cardiovascular:  . Regular rate and rhythm. . No murmurs, ectopy, or gallups. . No lateral PMI. No thrills. Abdomen:  . Abdomen is soft, non-tender, non-distended. . No hernias, masses, or organomegaly. . Hypoactive bowel sounds . Eakins pouch in place. Musculoskeletal:  . No cyanosis, clubbing, or edema Skin:  . No rashes, lesions, ulcers . palpation of skin: no induration or nodules  Neurologic:  . CN 2-12 intact . Sensation all 4 extremities intact Psychiatric:  . Mental status o Mood, affect appropriate o Orientation to person, place, time  . judgment and insight appear intact    I have personally reviewed the following:   Today's Data  . CMP, Vitals  Micro Data  . Nares MSSA and MRSA positive.  Imaging  . CT abdomen: Entero-cutaneous fistula.  Scheduled Meds: . amiodarone  200 mg Oral Daily  . carvedilol  3.125 mg Oral BID WC  . Chlorhexidine Gluconate  Cloth  6 each Topical Daily  . feeding supplement (ENSURE ENLIVE)  237 mL Oral BID BM  . heparin  5,000 Units Subcutaneous Q8H  . insulin aspart  0-9 Units Subcutaneous Q4H  . mirtazapine  15 mg Oral QPM  . multivitamin with minerals  1 tablet Oral Daily  . pantoprazole  40 mg Oral Daily  . saccharomyces boulardii  250 mg Oral BID   Continuous Infusions: . sodium chloride 10 mL/hr at 12/16/18 0600  . TPN ADULT (ION) 35 mL/hr at 12/18/18 0300    Active Problems:   AKI (acute kidney injury) ([place])   Unstageable pressure ulcer of sacral region ([place])   LOS: 8 days   A & P  Acute kidney injury-patient presented with acute kidney injury with creatinine 2.94, her baseline creatinine as of November 12, 2018 was 1.89.  Started on 0.45% normal saline at 100 mL/h as patient also had hypernatremia with sodium 156. Sodium today is 139. Creatinine is 1.32 today. Follow creatinine, electrolytes, and volume status carefully.   Hypernatremia: Resolved with TPN. Sodium is 139 today.   Loose BM's: One yesterday. Loose BM's expected due to patient's very minimal intake of solid food.  Hypokalemia: Resolved. Monitor and supplement as necessary.   Enterocutaneous fistula-seen on CT scan of the abdomen pelvis, [name] was consulted by [name] physician.  No immediate surgical intervention required.  [name] consulted and Eakins pouch in place. They have recommended CT again later this week. [name] pouch has been placed and has had low output. If she continues to have low output, the patient may be allowed to eat, per Holden, and discharged soon. I am unable to determine if this fisula is a complication of her Holden or not. Holden states that the patient is having low output from fistula. They recommend weaning her off of TPN to oral diet. She may then be discharged.  Unstageable ulcer of the sacrum and Stage II pressure ulcer on buttock: Present on admission. [name] consulted.    Cholecystitis-CT scan showed inflammation around gallbladder.  Will obtain abdominal ultrasound in a.m. No surgical intervention is planned.  Zosyn has been discontinued.   History of wide-complex tachycardia/CAD-continue amiodarone, Coreg.   Diabetes mellitus/hypoglycemia: IV D10 with IV 1//2 NS. Monitor. HbA1c 4.7.  I have seen and examined this patient myself. I have spent 30 minutes in her evaluation and Holden.  DVT prophylaxis: Heparin Code Status: Full Code Family Communication: No family present. Disposition Plan: tbd.    Savannah Kallstrom, DO [facility] Direct contact: see [email or web address]  7PM-7AM contact night coverage as above 12/18/2018, 5:11 PM  LOS: 1 day

## 2018-12-18 NOTE — Progress Notes (Signed)
Modified Barium Swallow Progress Note  Patient Details  Name: [name] MRN: [id number] Date of Birth: 04-15-1941  Today's Date: 12/18/2018  Modified Barium Swallow completed.  Full report located under Chart Review in the Imaging Section.  Brief recommendations include the following:  Clinical Impression:   Pt presents with mild dysphagia, primarily due to oral phase deficits. Due to impaired mastication, pt expectorated regular texture solid. Pt demonstrated decreased bolus cohesion, prolonged transit, and min oral residue of dysphagia 3 soft solids. Also due to decreased bolus cohesion, pt was unable to coordinate deglutition of a barium pill with thin barium; pill was expectorated. Pt's pharyngeal swallow function was [facility] across solid and liquid PO trials. When consuming thin liquids, pt initiated the swallow at the level of the pyriforms. There was one instance of shallow penetration of thin into laryngeal vestibule, which remained above the vocal folds and was ejected during the swallow. Recommend pt initiate dysphagia 2 (minced/ground) diet with thin liquids, meds crushed in puree, and full supervision to assist with use of swallow strategies. Pt would benefit from continued skilled intervention for dysphagia to ensure diet safety and efficiency while inpatient.    Swallow Evaluation Recommendations       SLP Diet Recommendations: Dysphagia 2 (Fine chop) solids;Thin liquid   Liquid Administration via: Cup;Straw   Medication Administration: Crushed with puree   Supervision: Full supervision/cueing for compensatory strategies   Compensations: Minimize environmental distractions;Slow rate;Small sips/bites   Postural Changes: Seated upright at 90 degrees   Oral Care Recommendations: Oral care BID        Arbutus Leas 12/18/2018,3:01 PM

## 2018-12-18 NOTE — Progress Notes (Signed)
[place] NOTE   Pharmacy Consult for TPN Indication: EC Fistula  Patient Measurements: Height: _0  (160 cm) Weight: 148 lb (67.1 kg) IBW/kg (Calculated) : 52.4 TPN AdjBW (KG): 56.1 Body mass index is 26.22 kg/m.  Assessment: 78 year old female s/p exploratory laparotomy with lysis of adhesions and bowel resection for ischemic bowel in May 2000. Patient required TPN during that admission but was weaned off to enteral diet. Re-admitted from home on 12/10/18 with feces draining from wound indicating EC fistula and AKI. Patient also with inflammation around her gallbladder. Patient has had ~10.5% weight gain over the past 3 months. Pharmacy consulted for TPN.   GI: EC fistula - Eakin pouch placed. Poor candidate for reoperative surgery. Not much output. Pre-albumin very low at 6.6>6.9. PPI po. Florastor po.  Increased drain output over last 24h Now on D1 diet, ~15% meals eaten on first day, tolerating well Endo: Hypoglycemia prior to dextrose and TPN. CBGs 125-147 improved Insulin requirements in the past 24 hours: 3 unit SSI Lytes: Na 138, Cl down 113 and CO2 up 21. K 5.1. Phos downtrend to 2.8 Renal: AKI on CKD, SCR improving at 1.84>1.28, BUN 46. UOP 0.5 ml/kg/hr  Pulm: RA Cards: BP improved, HR 80s on Amiodarone and Coreg. Hepatobil: AST/ALT/Tbili/TG wnl. Alk Phos elevated but trending down. Alb 1. Neuro: Pain 0 to 4. NuDESC 1. Remeron every PM.  ID: s/p Zosyn for possible intra-abdominal infection. WBC wnl.   TPN Access: Right IJ tunneled CVC catheter placed 8/21 TPN start date: 12/12/18 Nutritional Goals (per RD recommendation on 8/21): KCal: 1800-2000 Protein: 90-105 grams Fluid: >1.8L  Goal TPN rate is 70 ml/hr (provides 100%)  Current Nutrition:  D1 diet started 8/25 (Ensure Enlive BID + magic cup TID + Hormel shake TID) TPN   Plan:  Has been weaning and will not order TPN for this evening, will allow this bag to run out  today MV already taken out of TPN and now PO Pharmacy will sign off  Bertis Ruddy, PharmD Clinical Pharmacist Please check AMION for all [place] numbers 12/18/2018 9:41 AM

## 2018-12-19 DIAGNOSIS — E43 Unspecified severe protein-calorie malnutrition: Secondary | ICD-10-CM

## 2018-12-19 DIAGNOSIS — R627 Adult failure to thrive: Secondary | ICD-10-CM

## 2018-12-19 DIAGNOSIS — D649 Anemia, unspecified: Secondary | ICD-10-CM

## 2018-12-19 LAB — CBC WITH DIFFERENTIAL/PLATELET
Abs Immature Granulocytes: 0.03 10*3/uL (ref 0.00–0.07)
Basophils Absolute: 0 10*3/uL (ref 0.0–0.1)
Basophils Relative: 0 %
Eosinophils Absolute: 0.3 10*3/uL (ref 0.0–0.5)
Eosinophils Relative: 5 %
HCT: 16.6 % — ABNORMAL LOW (ref 36.0–46.0)
Hemoglobin: 5.2 g/dL — CL (ref 12.0–15.0)
Immature Granulocytes: 1 %
Lymphocytes Relative: 28 %
Lymphs Abs: 1.4 10*3/uL (ref 0.7–4.0)
MCH: 26.8 pg (ref 26.0–34.0)
MCHC: 31.3 g/dL (ref 30.0–36.0)
MCV: 85.6 fL (ref 80.0–100.0)
Monocytes Absolute: 0.4 10*3/uL (ref 0.1–1.0)
Monocytes Relative: 9 %
Neutro Abs: 2.9 10*3/uL (ref 1.7–7.7)
Neutrophils Relative %: 57 %
Platelets: 97 10*3/uL — ABNORMAL LOW (ref 150–400)
RBC: 1.94 MIL/uL — ABNORMAL LOW (ref 3.87–5.11)
RDW: 21.4 % — ABNORMAL HIGH (ref 11.5–15.5)
WBC: 5 10*3/uL (ref 4.0–10.5)
nRBC: 0 % (ref 0.0–0.2)

## 2018-12-19 LAB — HEMOGLOBIN AND HEMATOCRIT, BLOOD
HCT: 30.6 % — ABNORMAL LOW (ref 36.0–46.0)
Hemoglobin: 10.4 g/dL — ABNORMAL LOW (ref 12.0–15.0)

## 2018-12-19 LAB — GLUCOSE, CAPILLARY
Glucose-Capillary: 54 mg/dL — ABNORMAL LOW (ref 70–99)
Glucose-Capillary: 103 mg/dL — ABNORMAL HIGH (ref 70–99)
Glucose-Capillary: 417 mg/dL — ABNORMAL HIGH (ref 70–99)
Glucose-Capillary: 52 mg/dL — ABNORMAL LOW (ref 70–99)
Glucose-Capillary: 113 mg/dL — ABNORMAL HIGH (ref 70–99)
Glucose-Capillary: 90 mg/dL (ref 70–99)
Glucose-Capillary: 112 mg/dL — ABNORMAL HIGH (ref 70–99)
Glucose-Capillary: 89 mg/dL (ref 70–99)

## 2018-12-19 LAB — COMPREHENSIVE METABOLIC PANEL
ALT: 16 U/L (ref 0–44)
AST: 32 U/L (ref 15–41)
Albumin: 1 g/dL — ABNORMAL LOW (ref 3.5–5.0)
Alkaline Phosphatase: 133 U/L — ABNORMAL HIGH (ref 38–126)
Anion gap: 5 (ref 5–15)
BUN: 44 mg/dL — ABNORMAL HIGH (ref 8–23)
CO2: 22 mmol/L (ref 22–32)
Calcium: 7.3 mg/dL — ABNORMAL LOW (ref 8.9–10.3)
Chloride: 106 mmol/L (ref 98–111)
Creatinine, Ser: 1.25 mg/dL — ABNORMAL HIGH (ref 0.44–1.00)
GFR calc Af Amer: 47 mL/min — ABNORMAL LOW (ref >60)
GFR calc non Af Amer: 41 mL/min — ABNORMAL LOW (ref >60)
Glucose, Bld: 364 mg/dL — ABNORMAL HIGH (ref 70–99)
Potassium: 4.4 mmol/L (ref 3.5–5.1)
Sodium: 133 mmol/L — ABNORMAL LOW (ref 135–145)
Total Bilirubin: 0.4 mg/dL (ref 0.3–1.2)
Total Protein: 4.2 g/dL — ABNORMAL LOW (ref 6.5–8.1)

## 2018-12-19 LAB — OCCULT BLOOD X 1 CARD TO LAB, STOOL: Fecal Occult Bld: NEGATIVE

## 2018-12-19 LAB — PREPARE RBC (CROSSMATCH)

## 2018-12-19 MED ORDER — ENSURE ENLIVE PO LIQD: 237.0000 mL | Freq: Three times a day (TID) | ORAL | Status: DC

## 2018-12-19 MED ORDER — DEXTROSE 50 % IV SOLN
25.0000 mL | Freq: Once | INTRAVENOUS | Status: AC
  Administered 2018-12-19: 25 mL via INTRAVENOUS
  Filled 2018-12-19: qty 50

## 2018-12-19 MED ORDER — ACETAMINOPHEN 325 MG PO TABS
650.0000 mg | ORAL_TABLET | Freq: Once | ORAL | Status: AC
  Administered 2018-12-19: 650 mg via ORAL
  Filled 2018-12-19 (×2): qty 2

## 2018-12-19 MED ORDER — DEXTROSE 50 % IV SOLN: 25.0000 g | INTRAVENOUS | Status: AC

## 2018-12-19 MED ORDER — DEXTROSE 50 % IV SOLN
INTRAVENOUS | Status: AC
  Administered 2018-12-19: 09:00:00
  Filled 2018-12-19: qty 50

## 2018-12-19 MED ORDER — SODIUM CHLORIDE 0.9% IV SOLUTION
Freq: Once | INTRAVENOUS | Status: AC
  Administered 2018-12-19: 11:00:00 via INTRAVENOUS

## 2018-12-19 MED ORDER — FUROSEMIDE 10 MG/ML IJ SOLN
20.0000 mg | Freq: Once | INTRAMUSCULAR | Status: AC
  Administered 2018-12-19: 20 mg via INTRAVENOUS
  Filled 2018-12-19: qty 2

## 2018-12-19 MED ORDER — DIPHENHYDRAMINE HCL 50 MG/ML IJ SOLN
12.5000 mg | Freq: Once | INTRAMUSCULAR | Status: AC
  Administered 2018-12-19: 12.5 mg via INTRAVENOUS
  Filled 2018-12-19: qty 1

## 2018-12-19 MED ORDER — DEXTROSE 5 % IV SOLN
INTRAVENOUS | Status: DC
  Administered 2018-12-19 – 2018-12-24 (×11): via INTRAVENOUS
  Administered 2018-12-25: 125 mL via INTRAVENOUS
  Administered 2018-12-26 – 2019-01-01 (×15): via INTRAVENOUS

## 2018-12-19 NOTE — Progress Notes (Signed)
PROGRESS NOTE  [name] DPO:[id number] DOB: 30-Mar-1941 DOA: 12/10/2018 PCP: [name], MD  Brief History   [name]  is a 78 y.o. female, with history of anemia of chronic disease, ASCVD, AICD placement, history of breast cancer, chronic bronchitis, stage III CKD, systolic heart failure, hyperlipidemia, GERD, gout, hypertension, rheumatoid arthritis who recently had expiratory laparotomy with lysis of adhesions, bowel resection for ischemic bowel in May 2020.  She had large abdominal wound, which is healing by secondary intention.  As per family they noted feces coming from the wound.  No abdominal pain or fever.  Patient was brought to the hospital for further evaluation.  Patient is a poor historian.  History obtained from ED records.   CT of the abdomen pelvis was done, no results are available in epic due to technical issues.  ED physician spoke to radiologist verbally about the results.     As per ED physician, there is an enterocutaneous fistula and inflammation around the gallbladder.  No acute cholecystitis.  General surgery, Dr. Rosendo Gros was consulted, he recommended no immediate surgery at this time.  Recommended patient to transfer to [facility] and surgery have been consulted again and have seen patient this morning. The patient now has an Eakins pouch, wound care, and TPN. Cental venous catheter has been placed.  Lynann Beaver pouch has been placed and has had low output. If she continues to have low output, and can be weaned off of TPN to an oral diet, the patient may be discharged soon. However, currently she is only ingesting about 15% of her meals. Calorie count in place.  Surgery has stopped TPN as of 12/18/2018. Patient has been hypoglycemic. D5 NS at 75 cc/hr has been started. This morning her hemoglobin is down to 6.4. She is being transfused with 2 units of PRBC's. Palliative care has been consulted.  She has been evaluated by SLP. She has been placed on a dysphagia 2 diet  with fine chopped meats and this liquids by cup or straw. Staff to assist.  Severe Protein Calorie Malnutrition: I appreciate nutrition's assistance. She is only taking in 50% of required caloric intake.   Consultants  . General Surgery - signed off . Wound care . Nutrition  Procedures  . PICC line placement.  Antibiotics   Anti-infectives (From admission, onward)   Start     Dose/Rate Route Frequency Ordered Stop   12/11/18 1000  piperacillin-tazobactam (ZOSYN) IVPB 3.375 g  Status:  Discontinued     3.375 g 12.5 mL/hr over 240 Minutes Intravenous Every 12 hours 12/10/18 2220 12/11/18 0522   12/11/18 1000  piperacillin-tazobactam (ZOSYN) IVPB 3.375 g  Status:  Discontinued     3.375 g 12.5 mL/hr over 240 Minutes Intravenous Every 12 hours 12/11/18 0524 12/12/18 1122   12/11/18 0800  piperacillin-tazobactam (ZOSYN) IVPB 2.25 g  Status:  Discontinued     2.25 g 100 mL/hr over 30 Minutes Intravenous Every 6 hours 12/11/18 0522 12/11/18 0524   12/10/18 2215  piperacillin-tazobactam (ZOSYN) IVPB 3.375 g     3.375 g 100 mL/hr over 30 Minutes Intravenous  Once 12/10/18 2211 12/10/18 2256      Subjective  The patient is resting comfortably this morning. No new complaints.  Objective   Vitals:  Vitals:   12/19/18 1530 12/19/18 1830  BP: (!) 127/55 114/60  Pulse: 75 77  Resp: 16 17  Temp: 98.5 F (36.9 C) 98.8 F (37.1 C)  SpO2: 100% 100%    Exam:  Constitutional:  . The patient is awake and alert. Nonverbal. No acute distress.  Respiratory:  . No increased work of breathing. . No wheezes, rales, or rhonchi. . No tactile fremitus. Cardiovascular:  . Regular rate and rhythm. . No murmurs, ectopy, or gallups. . No lateral PMI. No thrills. Abdomen:  . Abdomen is soft, non-tender, non-distended. . No hernias, masses, or organomegaly. . Hypoactive bowel sounds . Eakins pouch in place. Musculoskeletal:  . No cyanosis, clubbing, or edema Skin:  . No rashes,  lesions, ulcers . palpation of skin: no induration or nodules Neurologic:  . CN 2-12 intact . Sensation all 4 extremities intact Psychiatric:  . Mental status o Mood, affect appropriate o Orientation to person, place, time  . judgment and insight appear intact    I have personally reviewed the following:   Today's Data  . CMP, CBC, Vitals  Micro Data  . Nares MSSA and MRSA positive.  Imaging  . CT abdomen: Entero-cutaneous fistula.  Scheduled Meds: . amiodarone  200 mg Oral Daily  . carvedilol  3.125 mg Oral BID WC  . Chlorhexidine Gluconate Cloth  6 each Topical Daily  . feeding supplement (ENSURE ENLIVE)  237 mL Oral TID BM  . heparin  5,000 Units Subcutaneous Q8H  . insulin aspart  0-9 Units Subcutaneous Q4H  . mirtazapine  15 mg Oral QPM  . multivitamin with minerals  1 tablet Oral Daily  . pantoprazole  40 mg Oral Daily  . saccharomyces boulardii  250 mg Oral BID   Continuous Infusions: . sodium chloride 10 mL/hr at 12/16/18 0600  . dextrose 75 mL/hr at 12/19/18 1830    Active Problems:   AKI (acute kidney injury) ([place])   Unstageable pressure ulcer of sacral region ([place])   LOS: 9 days   A & P  Acute kidney injury-patient presented with acute kidney injury with creatinine 2.94, her baseline creatinine as of November 12, 2018 was 1.25.  Started on 0.45% normal saline at 100 mL/h as patient also had hypernatremia with sodium 156. Sodium today is 139. Creatinine is 1.32 today. Follow creatinine, electrolytes, and volume status carefully.   Hypernatremia: Resolved with TPN. Sodium is 133 today.   Anemia: Hemoglobin is 6.4 this morning. The patient will be transfused with 2 units of PRBC's. FOBT has been ordered.  Loose BM's: One yesterday. Loose BM's expected due to patient's very minimal intake of solid food.  Hypokalemia: Resolved. Monitor and supplement as necessary. Supplement.   Enterocutaneous fistula-seen on CT scan of the abdomen pelvis, general surgery was  consulted by ED physician.  No immediate surgical intervention required.  General surgery consulted and Eakins pouch in place. They have recommended CT again later this week. Lynann Beaver pouch has been placed and has had low output. If she continues to have low output, the patient may be allowed to eat, per surgery, and discharged soon. I am unable to determine if this fisula is a complication of her surgery or not. Surgery states that the patient is having low output from fistula. They recommend weaning her off of TPN to oral diet. She may then be discharged. However, the patient is currently only taking in 50% of her nutitional caloric requirements.  Unstageable ulcer of the sacrum and Stage II pressure ulcer on buttock: Present on admission. Wound care consulted.   Cholecystitis-CT scan showed inflammation around gallbladder.  Will obtain abdominal ultrasound in a.m. No surgical intervention is planned.  Zosyn has been discontinued.   History of wide-complex  tachycardia/CAD-continue amiodarone, Coreg.   Diabetes mellitus/hypoglycemia: IV D10 with IV 1//2 NS. Monitor. HbA1c 4.7.  I have seen and examined this patient myself. I have spent 38 minutes in her evaluation and care.  DVT prophylaxis: Heparin Code Status: Full Code Family Communication: No family present. I attempted to contact the patient's husband this morning about he drop in hemoglobin. There was no answer. Disposition Plan: tbd. Palliative care has been consulted.  Camdan Burdi, DO [facility] Direct contact: see [email or web address]  7PM-7AM contact night coverage as above 12/19/2018, 7:36 PM  LOS: 1 day

## 2018-12-19 NOTE — Plan of Care (Signed)
  Problem: Nutrition: Goal: Adequate nutrition will be maintained Outcome: Progressing   Problem: Pain Managment: Goal: General experience of comfort will improve Outcome: Progressing   Problem: Safety: Goal: Ability to remain free from injury will improve Outcome: Progressing   

## 2018-12-19 NOTE — Plan of Care (Signed)
  Problem: Education: Goal: Knowledge of General Education information will improve Description: Including pain rating scale, medication(s)/side effects and non-pharmacologic comfort measures Outcome: Progressing   Problem: Health Behavior/Discharge Planning: Goal: Ability to manage health-related needs will improve Outcome: Progressing   Problem: Clinical Measurements: Goal: Ability to maintain clinical measurements within normal limits will improve Outcome: Progressing Goal: Will remain free from infection Outcome: Progressing Goal: Respiratory complications will improve Outcome: Progressing Goal: Cardiovascular complication will be avoided Outcome: Progressing   Problem: Coping: Goal: Level of anxiety will decrease Outcome: Progressing   Problem: Elimination: Goal: Will not experience complications related to urinary retention Outcome: Progressing   Problem: Pain Managment: Goal: General experience of comfort will improve Outcome: Progressing   Problem: Safety: Goal: Ability to remain free from injury will improve Outcome: Progressing   Problem: Skin Integrity: Goal: Risk for impaired skin integrity will decrease Outcome: Progressing

## 2018-12-19 NOTE — Progress Notes (Addendum)
Patient ID: [name], female   DOB: 06/14/1940, 78 y.o.   MRN: [id number]       Subjective: Wanting to sit up and wanting an ensure.  Doesn't complain of pain or anything else.  Objective: Vital signs in last 24 hours: Temp:  [97.9 F (36.6 C)-98.8 F (37.1 C)] 97.9 F (36.6 C) (08/28 0525) Pulse Rate:  [73-78] 73 (08/28 0525) Resp:  [16-20] 16 (08/28 0525) BP: (120-130)/(50-58) 121/50 (08/28 0525) SpO2:  [99 %-100 %] 100 % (08/28 0525) Last BM Date: 12/18/18  Intake/Output from previous day: 08/27 0701 - 08/28 0700 In: 450 [P.O.:420; I.V.:30] Out: 170 [Urine:150; Drains:20] Intake/Output this shift: Total I/O In: -  Out: 350 [Urine:350]  PE: Abd: soft, NT, ND, Eakin's pouch with essentially not output this morning.  Wound is otherwise stable.  Lab Results:  Recent Labs    12/19/18 0427  WBC 5.0  HGB 5.2*  HCT 16.6*  PLT 97*   BMET Recent Labs    12/18/18 0433 12/19/18 0427  NA 139 133*  K 5.0 4.4  CL 113* 106  CO2 22 22  GLUCOSE 92 364*  BUN 52* 44*  CREATININE 1.32* 1.25*  CALCIUM 7.9* 7.3*   PT/INR No results for input(s): LABPROT, INR in the last 72 hours. CMP     Component Value Date/Time   NA 133 (L) 12/19/2018 0427   K 4.4 12/19/2018 0427   CL 106 12/19/2018 0427   CO2 22 12/19/2018 0427   GLUCOSE 364 (H) 12/19/2018 0427   BUN 44 (H) 12/19/2018 0427   CREATININE 1.25 (H) 12/19/2018 0427   CREATININE 1.07 (H) 02/13/2018 1142   CALCIUM 7.3 (L) 12/19/2018 0427   PROT 4.2 (L) 12/19/2018 0427   ALBUMIN 1.0 (L) 12/19/2018 0427   AST 32 12/19/2018 0427   ALT 16 12/19/2018 0427   ALKPHOS 133 (H) 12/19/2018 0427   BILITOT 0.4 12/19/2018 0427   GFRNONAA 41 (L) 12/19/2018 0427   GFRNONAA 49 (L) 02/13/2018 1142   GFRAA 47 (L) 12/19/2018 0427   GFRAA 57 (L) 02/13/2018 1142   Lipase     Component Value Date/Time   LIPASE 45 12/10/2018 1722       Studies/Results: Dg Swallowing Func-speech Pathology  Result Date: 12/18/2018  Objective Swallowing Evaluation: Type of Study: MBS-Modified Barium Swallow Study  Patient Details Name: [name] MRN: [id number] Date of Birth: October 08, 1940 Today's Date: 12/18/2018 Time: SLP Start Time (ACUTE ONLY): 1330 -SLP Stop Time (ACUTE ONLY): 1400 SLP Time Calculation (min) (ACUTE ONLY): 30 min Past Medical History: Past Medical History: Diagnosis Date . Anemia   Hgb of 9-10 . Anemia of chronic disease   Hgb of 9-10 chronically; 06/2010: H&H-10.7/33.5, MCV-81, normal iron studies in 2010  . Arteriosclerotic cardiovascular disease (ASCVD)   Remote PTCA by patient report; LBBB; associated cardiomyopathy, presumed ischemic with EF 40-45% previously, 20% in 06/2009; h/o clinical congestive heart failure; negative stress nuclear in 2009 with inferoseptal and apical scar . Automatic implantable cardioverter-defibrillator in situ  . Breast cancer ([place])   breast . Chronic bronchitis ([place])  . Chronic renal disease, stage 3, moderately decreased glomerular filtration rate (GFR) between 30-59 mL/min/1.73 square meter (HCC) 02/01/2016 . Congestive heart failure (CHF) ([place])  . Elevated cholesterol  . Elevated sed rate  . GERD (gastroesophageal reflux disease)  . GI bleed  . Gout  . HOH (hard of hearing)  . Hyperlipidemia  . Hypertension  . Rheumatoid arthritis ([place])  . UTI (urinary tract infection)  Past Surgical History: Past Surgical History: Procedure Laterality Date . BI-VENTRICULAR IMPLANTABLE CARDIOVERTER DEFIBRILLATOR N/A 07/28/2012  Procedure: BI-VENTRICULAR IMPLANTABLE CARDIOVERTER DEFIBRILLATOR  (CRT-D);  Surgeon: Evans Lance, MD;  Location: [facility] CATH LAB;  Service: Cardiovascular;  Laterality: N/A; . BI-VENTRICULAR IMPLANTABLE CARDIOVERTER DEFIBRILLATOR  (CRT-D)  07/28/2012 . BOWEL RESECTION N/A 08/21/2018  Procedure: Small Bowel Resection;  Surgeon: Jovita Kussmaul, MD;  Location: [facility];  Service: General;  Laterality: N/A; . BREAST BIOPSY Bilateral  . CATARACT EXTRACTION W/ INTRAOCULAR LENS IMPLANT Left  .  CATARACT EXTRACTION W/PHACO Right 09/15/2013  Procedure: CATARACT EXTRACTION PHACO AND INTRAOCULAR LENS PLACEMENT (IOC);  Surgeon: Elta Guadeloupe T. Gershon Crane, MD;  Location: [facility] OR[facility];  Service: Ophthalmology;  Laterality: Right;  CDE:  10.74 . COLONOSCOPY N/A 03/04/2015  Procedure: COLONOSCOPY;  Surgeon: Rogene Houston, MD;  Location: [facility] ENDO SU[facility];  Service: Endoscopy;  Laterality: N/A;  10:50  . ESOPHAGOGASTRODUODENOSCOPY N/A 03/04/2015  Procedure: ESOPHAGOGASTRODUODENOSCOPY (EGD);  Surgeon: Rogene Houston, MD;  Location: [facility];  Service: Endoscopy;  Laterality: N/A; . ESOPHAGOGASTRODUODENOSCOPY N/A 09/21/2016  Procedure: ESOPHAGOGASTRODUODENOSCOPY (EGD);  Surgeon: Rogene Houston, MD;  Location: [facility];  Service: Endoscopy;  Laterality: N/A; . GIVENS CAPSULE STUDY N/A 09/22/2016  Procedure: GIVENS CAPSULE STUDY;  Surgeon: Rogene Houston, MD;  Location: [facility];  Service: Endoscopy;  Laterality: N/A; . IR FLUORO GUIDE CV LINE RIGHT  12/12/2018 . IR US GUIDE VASC ACCESS RIGHT  12/12/2018 . LAPAROTOMY N/A 08/21/2018  Procedure: EXPLORATORY LAPAROTOMY WITH LYSIS OF ADHESIONS;  Surgeon: Jovita Kussmaul, MD;  Location: [place] [facility];  Service: General;  Laterality: N/A; . MASTECTOMY Left 1998 . TENDON TRANSFER Right 08/15/2017  Procedure: TENDON TRANSFER RIGHT WRIST EXTENSORS AS NEEDED, ULNA RESECTION AND STABILIZATION;  Surgeon: Charlotte Crumb, MD;  Location: [place] [facility];  Service: Orthopedics;  Laterality: Right; . TOTAL KNEE ARTHROPLASTY Right   Dr.Harrison . TUBAL LIGATION   HPI: Pt is a 78 y.o. female, with history of anemia of chronic disease, ASCVD, AICD placement, history of breast cancer, chronic bronchitis, stage III CKD, systolic heart failure, hyperlipidemia, GERD, gout, hypertension, rheumatoid arthritis who recently had expiratory laparotomy with lysis of adhesions, bowel resection for ischemic bowel in May 2020.  She had large abdominal wound, which is healing by secondary intention.  As per family they noted  feces coming from the wound; s/p eakin pouch 8/21. Pt was followed by SLP during recent admission with recommedation for pureed diet, thin liquids, but pt had very little intake.  Assessment / Plan / Recommendation   Pt presents with mild dysphagia, primarily due to oral phase deficits. Due to impaired mastication, pt expectorated regular texture solid. Pt demonstrated decreased bolus cohesion, prolonged transit, and min oral residue of dysphagia 3 soft solids. Also due to decreased bolus cohesion, pt was unable to coordinate deglutition of a barium pill with thin barium; pill was expectorated. Pt's pharyngeal swallow function was [facility] across solid and liquid PO trials. When consuming thin liquids, pt initiated the swallow at the level of the pyriforms. There was one instance of shallow penetration of thin into laryngeal vestibule, which remained above the vocal folds and was ejected during the swallow. Recommend pt initiate dysphagia 2 (minced/ground) diet with thin liquids, meds crushed in puree, and full supervision to assist with use of swallow strategies. Pt would benefit from continued skilled intervention for dysphagia to ensure diet safety and efficiency while inpatient. CHL IP CLINICAL IMPRESSIONS 12/18/2018 Clinical Impression -- SLP Visit Diagnosis Dysphagia, oropharyngeal phase (  R13.12) Attention and concentration deficit following -- Frontal lobe and executive function deficit following -- Impact on safety and function Mild aspiration risk   CHL IP TREATMENT RECOMMENDATION 12/18/2018 Treatment Recommendations Therapy as outlined in treatment plan below   Prognosis 12/18/2018 Prognosis for Safe Diet Advancement Good Barriers to Reach Goals -- Barriers/Prognosis Comment -- CHL IP DIET RECOMMENDATION 12/18/2018 SLP Diet Recommendations Dysphagia 2 (Fine chop) solids;Thin liquid Liquid Administration via Cup;Straw Medication Administration Crushed with puree Compensations Minimize environmental distractions;Slow  rate;Small sips/bites Postural Changes Seated upright at 90 degrees   CHL IP OTHER RECOMMENDATIONS 12/18/2018 Recommended Consults -- Oral Care Recommendations Oral care BID Other Recommendations --   CHL IP FOLLOW UP RECOMMENDATIONS 12/18/2018 Follow up Recommendations 24 hour supervision/assistance   CHL IP FREQUENCY AND DURATION 12/18/2018 Speech Therapy Frequency (ACUTE ONLY) min 2x/week Treatment Duration 2 weeks      CHL IP ORAL PHASE 12/18/2018 Oral Phase Impaired Oral - Pudding Teaspoon -- Oral - Pudding Cup -- Oral - Honey Teaspoon -- Oral - Honey Cup -- Oral - Nectar Teaspoon -- Oral - Nectar Cup -- Oral - Nectar Straw -- Oral - Thin Teaspoon -- Oral - Thin Cup WFL Oral - Thin Straw WFL Oral - Puree -- Oral - Mech Soft Piecemeal swallowing;Impaired mastication;Decreased bolus cohesion Oral - Regular -- Oral - Multi-Consistency Piecemeal swallowing;Decreased bolus cohesion Oral - Pill -- Oral Phase - Comment --  CHL IP PHARYNGEAL PHASE 12/18/2018 Pharyngeal Phase Impaired Pharyngeal- Pudding Teaspoon -- Pharyngeal -- Pharyngeal- Pudding Cup -- Pharyngeal -- Pharyngeal- Honey Teaspoon -- Pharyngeal -- Pharyngeal- Honey Cup -- Pharyngeal -- Pharyngeal- Nectar Teaspoon -- Pharyngeal -- Pharyngeal- Nectar Cup -- Pharyngeal -- Pharyngeal- Nectar Straw -- Pharyngeal -- Pharyngeal- Thin Teaspoon -- Pharyngeal -- Pharyngeal- Thin Cup Delayed swallow initiation-pyriform sinuses;Penetration/Aspiration before swallow Pharyngeal Material enters airway, remains ABOVE vocal cords then ejected out Pharyngeal- Thin Straw WFL Pharyngeal -- Pharyngeal- Puree -- Pharyngeal -- Pharyngeal- Mechanical Soft Delayed swallow initiation-vallecula;Pharyngeal residue - valleculae Pharyngeal -- Pharyngeal- Regular (No Data) Pharyngeal -- Pharyngeal- Multi-consistency (No Data) Pharyngeal -- Pharyngeal- Pill -- Pharyngeal -- Pharyngeal Comment --  CHL IP CERVICAL ESOPHAGEAL PHASE 12/18/2018 Cervical Esophageal Phase WFL Pudding Teaspoon --  Pudding Cup -- Honey Teaspoon -- Honey Cup -- Nectar Teaspoon -- Nectar Cup -- Nectar Straw -- Thin Teaspoon -- Thin Cup -- Thin Straw -- Puree -- Mechanical Soft -- Regular -- Multi-consistency -- Pill -- Cervical Esophageal Comment -- Arbutus Leas 12/18/2018, 3:50 PM               Anti-infectives: Anti-infectives (From admission, onward)   Start     Dose/Rate Route Frequency Ordered Stop   12/11/18 1000  piperacillin-tazobactam (ZOSYN) IVPB 3.375 g  Status:  Discontinued     3.375 g 12.5 mL/hr over 240 Minutes Intravenous Every 12 hours 12/10/18 2220 12/11/18 0522   12/11/18 1000  piperacillin-tazobactam (ZOSYN) IVPB 3.375 g  Status:  Discontinued     3.375 g 12.5 mL/hr over 240 Minutes Intravenous Every 12 hours 12/11/18 0524 12/12/18 1122   12/11/18 0800  piperacillin-tazobactam (ZOSYN) IVPB 2.25 g  Status:  Discontinued     2.25 g 100 mL/hr over 30 Minutes Intravenous Every 6 hours 12/11/18 0522 12/11/18 0524   12/10/18 2215  piperacillin-tazobactam (ZOSYN) IVPB 3.375 g     3.375 g 100 mL/hr over 30 Minutes Intravenous  Once 12/10/18 2211 12/10/18 2256       Assessment/Plan HTN HLD Hx of GI bleed GERD CHF COPD Hx of  ICD CAD CKD w/ AKI - improving, Cr 1.32 Chronic Anemia - 5.5, transfuse per medicine.  No bleeding from wound  EC fistula -no enteric output noted -Adv to D2 diet -no plans for any surgical intervention. TNA off.  Given her overall medical state, I suspect she did not eat much prior to her admission.  Would continue conservative management at this time.  She is surgically stable for DC when felt to be medically stable.  She has follow up arranged with Dr. Marlou [facility] in 2 weeks to check her abdomen. -may continues eakin's pouch or given minimal output, WD dressings can be placed at home to help with ease given minimal output.  S/p exploratory laparotomy with lysis of adhesions resection of small bowel and appendectomy 08/21/2018 Dr. Eddie Dibbles TothIII  FEN:D2 diet,  TNA off ID: Zosyn 8/20. None currently. DVT: SCDs, Heparin Follow-up:Dr. Marlou [facility]  We will sign off.  No further surgical plans or intervention at this time.  Diet as tolerates and follow up has been arranged for the patient.   LOS: 9 days    [name] , [facility] Surgery 12/19/2018, 8:59 AM Pager: [phone]

## 2018-12-19 NOTE — Progress Notes (Signed)
Hypoglycemic Event  CBG: 52  Treatment: 33mL D50 by Ludwig [facility], RN  Symptoms: pt asymptomatic  Follow-up CBG: Time: 0930 CBG Result: 113  Possible Reasons for Event: unknown  Comments/MD notified: Ava Swayze, DO  Will continue to monitor.  Racheal Patches, RN

## 2018-12-19 NOTE — Progress Notes (Signed)
Calorie Count Note  48 hour calorie count ordered.  Diet: dysphagia 2 diet with thin liquids Supplements: Ensure Enlive po BID, each supplement provides 350 kcal and 20 grams of protein; Magic cup TID with meals, each supplement provides 290 kcal and 9 grams of protein; Hormel shake TID with meals, each supplement provides 520 kcals and 22 grams protein  8/21- CWOCN placed eakin pouch on EC fistula; midline catheter placed;tunnelled CVC placed;TPN initiated 8/25- s/p BSE- advanced to dysphagia 1 diet with nectar thick liquids 8/27- advanced to dysphagia 2 diet with thin liquids, TPN d/c  8/27 Breakfast: 193 kcals, 10 grams protein Lunch: refused Dinner: refused Supplements: 2 Ensure supplements (700 kcals, 40 grams protein)  Total intake: 893 kcal (50% of minimum estimated needs)  50 grams protein (56% of minimum estimated needs)  8/28 Breakfast: 155 kcals, 9 grams protein Supplements: 1 Ensure supplement  Nutrition Dx: Inadequate oral intake related to altered GI function as evidenced by NPO status; progressing- advanced to dysphagia 2 diet with thin liquids  Goal: Patient will meet greater than or equal to 90% of their needs; progressing   Intervention:   -MVI with minerals daily (start 12/18/18 if TPN d/c) -Increase Ensure Enlive po to QID, each supplement provides 350 kcal and 20 grams of protein -Magic cup TID with meals, each supplement provides 290 kcal and 9 grams of protein -Hormel shake TID with meals, each supplement provides 520 kcals and 22 grams protein  [name] A. Jimmye Norman, RD, LDN, [place] Registered Dietitian II Certified Diabetes Care and Education Specialist Pager: [phone] After hours Pager: [phone]

## 2018-12-19 NOTE — Progress Notes (Signed)
  Speech Language Pathology Treatment: Dysphagia  Patient Details Name: [name] MRN: [id number] DOB: [birth year] Today's Date: 12/19/2018 Time: 1224-4975 SLP Time Calculation (min) (ACUTE ONLY): 11 min  Assessment / Plan / Recommendation Clinical Impression  Pt consumed soft, chopped foods and ice cream with intermittent sips of water used as a liquid wash. Clinical presentation appears to be consistent overall with MBS on previous date, with prolonged oral preparation but ultimately good oral clearance given Min cues. She had a single delayed cough across all trials. Recommend to continue current diet with additional, brief SLP f/u for tolerance.   HPI HPI: Pt is a 78 y.o. female, with history of anemia of chronic disease, ASCVD, AICD placement, history of breast cancer, chronic bronchitis, stage III CKD, systolic heart failure, hyperlipidemia, GERD, gout, hypertension, rheumatoid arthritis who recently had expiratory laparotomy with lysis of adhesions, bowel resection for ischemic bowel in May 2020.  She had large abdominal wound, which is healing by secondary intention.  As per family they noted feces coming from the wound; s/p eakin pouch 8/21. Pt was followed by SLP during recent admission with recommedation for pureed diet, thin liquids, but pt had very little intake.      SLP Plan  Continue with current plan of care       Recommendations  Diet recommendations: Dysphagia 2 (fine chop);Thin liquid Liquids provided via: Straw;Cup Medication Administration: Crushed with puree Supervision: Staff to assist with self feeding Compensations: Minimize environmental distractions;Slow rate;Small sips/bites Postural Changes and/or Swallow Maneuvers: Upright 30-60 min after meal;Seated upright 90 degrees                Oral Care Recommendations: Oral care BID Follow up Recommendations: 24 hour supervision/assistance SLP Visit Diagnosis: Dysphagia, oropharyngeal phase (R13.12) Plan:  Continue with current plan of care       GO                Venita Sheffield Talena Neira 12/19/2018, 2:52 PM  [name], M.A. [place] Acute Environmental education officer [phone] Office [phone]

## 2018-12-20 DIAGNOSIS — Z515 Encounter for palliative care: Secondary | ICD-10-CM

## 2018-12-20 DIAGNOSIS — K632 Fistula of intestine: Principal | ICD-10-CM

## 2018-12-20 LAB — GLUCOSE, CAPILLARY
Glucose-Capillary: 110 mg/dL — ABNORMAL HIGH (ref 70–99)
Glucose-Capillary: 61 mg/dL — ABNORMAL LOW (ref 70–99)
Glucose-Capillary: 76 mg/dL (ref 70–99)
Glucose-Capillary: 79 mg/dL (ref 70–99)
Glucose-Capillary: 83 mg/dL (ref 70–99)
Glucose-Capillary: 85 mg/dL (ref 70–99)
Glucose-Capillary: 90 mg/dL (ref 70–99)
Glucose-Capillary: 97 mg/dL (ref 70–99)

## 2018-12-20 LAB — COMPREHENSIVE METABOLIC PANEL
ALT: 21 U/L (ref 0–44)
AST: 33 U/L (ref 15–41)
Albumin: 1.3 g/dL — ABNORMAL LOW (ref 3.5–5.0)
Alkaline Phosphatase: 163 U/L — ABNORMAL HIGH (ref 38–126)
Anion gap: 7 (ref 5–15)
BUN: 36 mg/dL — ABNORMAL HIGH (ref 8–23)
CO2: 23 mmol/L (ref 22–32)
Calcium: 7.7 mg/dL — ABNORMAL LOW (ref 8.9–10.3)
Chloride: 104 mmol/L (ref 98–111)
Creatinine, Ser: 1.32 mg/dL — ABNORMAL HIGH (ref 0.44–1.00)
GFR calc Af Amer: 44 mL/min — ABNORMAL LOW (ref >60)
GFR calc non Af Amer: 38 mL/min — ABNORMAL LOW (ref >60)
Glucose, Bld: 83 mg/dL (ref 70–99)
Potassium: 4.6 mmol/L (ref 3.5–5.1)
Sodium: 134 mmol/L — ABNORMAL LOW (ref 135–145)
Total Bilirubin: 0.9 mg/dL (ref 0.3–1.2)
Total Protein: 5.1 g/dL — ABNORMAL LOW (ref 6.5–8.1)

## 2018-12-20 LAB — CBC WITH DIFFERENTIAL/PLATELET
Abs Immature Granulocytes: 0.03 10*3/uL (ref 0.00–0.07)
Basophils Absolute: 0 10*3/uL (ref 0.0–0.1)
Basophils Relative: 0 %
Eosinophils Absolute: 0.4 10*3/uL (ref 0.0–0.5)
Eosinophils Relative: 6 %
HCT: 31 % — ABNORMAL LOW (ref 36.0–46.0)
Hemoglobin: 10.1 g/dL — ABNORMAL LOW (ref 12.0–15.0)
Immature Granulocytes: 0 %
Lymphocytes Relative: 26 %
Lymphs Abs: 1.8 10*3/uL (ref 0.7–4.0)
MCH: 27.8 pg (ref 26.0–34.0)
MCHC: 32.6 g/dL (ref 30.0–36.0)
MCV: 85.4 fL (ref 80.0–100.0)
Monocytes Absolute: 0.8 10*3/uL (ref 0.1–1.0)
Monocytes Relative: 11 %
Neutro Abs: 4.1 10*3/uL (ref 1.7–7.7)
Neutrophils Relative %: 57 %
Platelets: 119 10*3/uL — ABNORMAL LOW (ref 150–400)
RBC: 3.63 MIL/uL — ABNORMAL LOW (ref 3.87–5.11)
RDW: 19.3 % — ABNORMAL HIGH (ref 11.5–15.5)
WBC: 7.2 10*3/uL (ref 4.0–10.5)
nRBC: 0 % (ref 0.0–0.2)

## 2018-12-20 LAB — TYPE AND SCREEN
ABO/RH(D): AB POS
Antibody Screen: NEGATIVE
Unit division: 0
Unit division: 0

## 2018-12-20 LAB — BPAM RBC
Blood Product Expiration Date: 202009022359
Blood Product Expiration Date: 202009162359
ISSUE DATE / TIME: 202008281041
ISSUE DATE / TIME: 202008281505
Unit Type and Rh: 8400
Unit Type and Rh: 8400

## 2018-12-20 LAB — OCCULT BLOOD X 1 CARD TO LAB, STOOL
Fecal Occult Bld: NEGATIVE
Fecal Occult Bld: NEGATIVE

## 2018-12-20 NOTE — Plan of Care (Signed)
  Problem: Clinical Measurements: Goal: Respiratory complications will improve Outcome: Progressing   Problem: Clinical Measurements: Goal: Cardiovascular complication will be avoided Outcome: Progressing   Problem: Nutrition: Goal: Adequate nutrition will be maintained Outcome: Progressing   Problem: Coping: Goal: Level of anxiety will decrease Outcome: Progressing   Problem: Elimination: Goal: Will not experience complications related to bowel motility Outcome: Progressing Goal: Will not experience complications related to urinary retention Outcome: Progressing   Problem: Skin Integrity: Goal: Risk for impaired skin integrity will decrease Outcome: Progressing   Problem: Safety: Goal: Ability to remain free from injury will improve Outcome: Progressing

## 2018-12-20 NOTE — Plan of Care (Signed)
  Problem: Coping: Goal: Level of anxiety will decrease Outcome: Progressing   Problem: Pain Managment: Goal: General experience of comfort will improve Outcome: Progressing   Problem: Skin Integrity: Goal: Risk for impaired skin integrity will decrease Outcome: Progressing   

## 2018-12-20 NOTE — Progress Notes (Signed)
Hypoglycemic Event  CBG: 0756 61mg /dl  Treatment: 4 oz of orange juice  Symptoms: asymptomatic  Follow-up CBG: Time: 0817 CBG Result: 76mg /dl  Possible Reasons for Event: lack of nutritional intake  Comments/MD notified: MD Swayze made aware    Samantha Crimes

## 2018-12-20 NOTE — Consult Note (Signed)
Palliative Medicine    Name: [name] Date: 12/20/2018 MRN: [id number]  DOB: 1940/06/18  Patient Care Team: [name], MD as PCP - General Herminio Commons, MD as PCP - Cardiology (Cardiology) Bo Merino, MD as Consulting Physician (Rheumatology) Evans Lance, MD as Consulting Physician (Cardiology) Rudean Haskell, [facility] as [place] [facility] (Pharmacist) Adaline Sill, CPhT as [place] [facility] (Pharmacy Technician)    [name]: Palliative Care consult requested for this 78 y.o. female with multiple medical problems including CAD, systolic heart failure status post AICD placement, CKD 3, chronic bronchitis, RA, who recently had an exploratory laparotomy with lysis of adhesions and bowel resection for ischemic bowel in May 2020.  She had a large open abdominal wound healing by secondary intention.  Patient was readmitted on 12/10/2018 with report of fecal material draining from wound.  Patient was found to have a fistula.  An Eakin's pouch was placed by surgery.  Patient was initially on TPN but that was eventually discontinued.  Oral intake is been markedly poor and patient has been persistently hyperglycemic.  Palliative care was consulted to help address goals.   SOCIAL HISTORY:     reports that she quit smoking about 11 years ago. Her smoking use included cigarettes. She started smoking about 62 years ago. She has a 7.50 pack-year smoking history. Her smokeless tobacco use includes chew. She reports that she does not drink alcohol or use drugs.   Patient is married and lives at home with her husband.  She had a daughter who died in the 75s.  Patient has stepchildren.  ADVANCE DIRECTIVES:  Not on file  CODE STATUS: DNR  PAST MEDICAL HISTORY: Past Medical History:  Diagnosis Date   Anemia    Hgb of 9-10   Anemia of chronic disease    Hgb of 9-10 chronically; 06/2010: H&H-10.7/33.5,  MCV-81, normal iron studies in 2010    Arteriosclerotic cardiovascular disease (ASCVD)    Remote PTCA by patient report; LBBB; associated cardiomyopathy, presumed ischemic with EF 40-45% previously, 20% in 06/2009; h/o clinical congestive heart failure; negative stress nuclear in 2009 with inferoseptal and apical scar   Automatic implantable cardioverter-defibrillator in situ    Breast cancer (HCC)    breast   Chronic bronchitis (HCC)    Chronic renal disease, stage 3, moderately decreased glomerular filtration rate (GFR) between 30-59 mL/min/1.73 square meter (HCC) 02/01/2016   Congestive heart failure (CHF) (HCC)    Elevated cholesterol    Elevated sed rate    GERD (gastroesophageal reflux disease)    GI bleed    Gout    HOH (hard of hearing)    Hyperlipidemia    Hypertension    Rheumatoid arthritis ([place])    UTI (urinary tract infection)     PAST SURGICAL HISTORY:  Past Surgical History:  Procedure Laterality Date   BI-VENTRICULAR IMPLANTABLE CARDIOVERTER DEFIBRILLATOR N/A 07/28/2012   Procedure: BI-VENTRICULAR IMPLANTABLE CARDIOVERTER DEFIBRILLATOR  (CRT-D);  Surgeon: Evans Lance, MD;  Location: [facility] CATH LAB;  Service: Cardiovascular;  Laterality: N/A;   BI-VENTRICULAR IMPLANTABLE CARDIOVERTER DEFIBRILLATOR  (CRT-D)  07/28/2012   BOWEL RESECTION N/A 08/21/2018   Procedure: Small Bowel Resection;  Surgeon: Jovita Kussmaul, MD;  Location: [place];  Service: General;  Laterality: N/A;   BREAST BIOPSY Bilateral    CATARACT EXTRACTION W/ INTRAOCULAR LENS IMPLANT Left    CATARACT EXTRACTION W/PHACO Right 09/15/2013   Procedure: CATARACT EXTRACTION PHACO AND INTRAOCULAR  LENS PLACEMENT (IOC);  Surgeon: Elta Guadeloupe T. Gershon Crane, MD;  Location: [facility] OR[facility];  Service: Ophthalmology;  Laterality: Right;  CDE:  10.74   COLONOSCOPY N/A 03/04/2015   Procedure: COLONOSCOPY;  Surgeon: Rogene Houston, MD;  Location: [facility] ENDO [facility];  Service: Endoscopy;  Laterality: N/A;  10:50     ESOPHAGOGASTRODUODENOSCOPY N/A 03/04/2015   Procedure: ESOPHAGOGASTRODUODENOSCOPY (EGD);  Surgeon: Rogene Houston, MD;  [facility];  Service: Endoscopy;  Laterality: N/A;   ESOPHAGOGASTRODUODENOSCOPY N/A 09/21/2016   Procedure: ESOPHAGOGASTRODUODENOSCOPY (EGD);  Surgeon: Rogene Houston, MD;  [facility];  Service: Endoscopy;  Laterality: N/A;   GIVENS CAPSULE STUDY N/A 09/22/2016   Procedure: GIVENS CAPSULE STUDY;  Surgeon: Rogene Houston, MD;  [facility];  Service: Endoscopy;  Laterality: N/A;   IR FLUORO GUIDE CV LINE RIGHT  12/12/2018   IR US GUIDE VASC ACCESS RIGHT  12/12/2018   LAPAROTOMY N/A 08/21/2018   Procedure: EXPLORATORY LAPAROTOMY WITH LYSIS OF ADHESIONS;  Surgeon: Jovita Kussmaul, MD;  Location: [place];  Service: General;  Laterality: N/A;   MASTECTOMY Left [place] Right 08/15/2017   Procedure: TENDON TRANSFER RIGHT WRIST EXTENSORS AS NEEDED, ULNA RESECTION AND STABILIZATION;  Surgeon: Charlotte Crumb, MD;  Location: [place];  Service: Orthopedics;  Laterality: Right;   TOTAL KNEE ARTHROPLASTY Right    Dr.Harrison   TUBAL LIGATION      HEMATOLOGY/ONCOLOGY HISTORY:  Oncology History  Breast cancer ([place])   Initial Diagnosis   Breast cancer     ALLERGIES:  is allergic to macrobid [nitrofurantoin macrocrystal].  MEDICATIONS:  Current Facility-Administered Medications  Medication Dose Route Frequency Provider Last Rate Last Dose   0.9 %  sodium chloride infusion   Intravenous PRN Swayze, Ava, DO 10 mL/hr at 12/16/18 0600     acetaminophen (TYLENOL) tablet 500 mg  500 mg Oral Q6H PRN Oswald Hillock, MD   500 mg at 12/18/18 1058   albuterol (PROVENTIL) (2.5 MG/3ML) 0.083% nebulizer solution 2.5 mg  2.5 mg Nebulization Q4H PRN Oswald Hillock, MD       amiodarone (PACERONE) tablet 200 mg  200 mg Oral Daily Oswald Hillock, MD   200 mg at 12/19/18 1117   carvedilol (COREG) tablet 3.125 mg  3.125 mg Oral BID WC Oswald Hillock, MD   3.125 mg at 12/19/18 9381   Chlorhexidine Gluconate Cloth 2 % PADS 6 each  6 each Topical Daily Swayze, Ava, DO   6 each at 12/19/18 1036   dextrose 5 % solution   Intravenous Continuous Swayze, Ava, DO 75 mL/hr at 12/19/18 2123     feeding supplement (ENSURE ENLIVE) (ENSURE ENLIVE) liquid 237 mL  237 mL Oral TID BM Swayze, Ava, DO       food thickener (THICK IT) powder   Oral PRN Swayze, Ava, DO       heparin injection 5,000 Units  5,000 Units Subcutaneous Q8H Oswald Hillock, MD   5,000 Units at 12/20/18 0604   insulin aspart (novoLOG) injection 0-9 Units  0-9 Units Subcutaneous Q4H Oswald Hillock, MD   2 Units at 12/17/18 1720   lidocaine (XYLOCAINE) 1 % (with pres) injection    PRN Aletta Edouard, MD   10 mL at 12/12/18 1752   mirtazapine (REMERON) tablet 15 mg  15 mg Oral QPM Oswald Hillock, MD   15 mg at 12/19/18 1838   multivitamin with minerals tablet 1 tablet  1 tablet  Oral Daily Swayze, Ava, DO   1 tablet at 12/19/18 1116   ondansetron (ZOFRAN) tablet 4 mg  4 mg Oral Q6H PRN Oswald Hillock, MD       Or   ondansetron [facility]) injection 4 mg  4 mg Intravenous Q6H PRN Oswald Hillock, MD   4 mg at 12/16/18 6578   oxyCODONE (Oxy IR/ROXICODONE) immediate release tablet 5 mg  5 mg Oral Q6H PRN Oswald Hillock, MD       pantoprazole (PROTONIX) EC tablet 40 mg  40 mg Oral Daily Oswald Hillock, MD   40 mg at 12/19/18 1117   Resource ThickenUp Clear   Oral PRN Saverio Danker, PA-C       saccharomyces boulardii (FLORASTOR) capsule 250 mg  250 mg Oral BID Oswald Hillock, MD   250 mg at 12/19/18 1116   sodium chloride flush (NS) 0.9 % injection 10-40 mL  10-40 mL Intracatheter PRN Swayze, Ava, DO   10 mL at 12/19/18 0446    VITAL SIGNS: BP (!) 141/63 (BP Location: Right Leg)    Pulse 72    Temp 99 F (37.2 C) (Oral)    Resp 18    Ht 5\' 3"  (1.6 m)    Wt 148 lb (67.1 kg)    SpO2 100%    BMI 26.22 kg/m  Filed Weights   12/10/18 2200  Weight: 148 lb (67.1 kg)    Estimated  body mass index is 26.22 kg/m as calculated from the following:   Height as of this encounter: 5\' 3"  (1.6 m).   Weight as of this encounter: 148 lb (67.1 kg).  LABS: CBC:    Component Value Date/Time   WBC 7.2 12/20/2018 0419   HGB 10.1 (L) 12/20/2018 0419   HCT 31.0 (L) 12/20/2018 0419   PLT 119 (L) 12/20/2018 0419   MCV 85.4 12/20/2018 0419   NEUTROABS 4.1 12/20/2018 0419   LYMPHSABS 1.8 12/20/2018 0419   MONOABS 0.8 12/20/2018 0419   EOSABS 0.4 12/20/2018 0419   BASOSABS 0.0 12/20/2018 0419   Comprehensive Metabolic Panel:    Component Value Date/Time   NA 134 (L) 12/20/2018 0419   K 4.6 12/20/2018 0419   CL 104 12/20/2018 0419   CO2 23 12/20/2018 0419   BUN 36 (H) 12/20/2018 0419   CREATININE 1.32 (H) 12/20/2018 0419   CREATININE 1.07 (H) 02/13/2018 1142   GLUCOSE 83 12/20/2018 0419   CALCIUM 7.7 (L) 12/20/2018 0419   AST 33 12/20/2018 0419   ALT 21 12/20/2018 0419   ALKPHOS 163 (H) 12/20/2018 0419   BILITOT 0.9 12/20/2018 0419   PROT 5.1 (L) 12/20/2018 0419   ALBUMIN 1.3 (L) 12/20/2018 0419    RADIOGRAPHIC STUDIES: Ct Abdomen Pelvis Wo Contrast  Result Date: 12/10/2018 CLINICAL DATA:  78 year old who underwent bowel resection for ischemia in April, 2020, presenting now with possible wound infection as there is drainage from the INFERIOR aspect of the incision. Intravenous contrast was not administered due to the patient's renal insufficiency with creatinine of 1.89 and estimated GFR of 29. EXAM: CT ABDOMEN AND PELVIS WITHOUT CONTRAST TECHNIQUE: Multidetector CT imaging of the abdomen and pelvis was performed following the standard protocol without IV contrast. COMPARISON:  09/09/2018 and earlier. FINDINGS: Lower chest: Respiratory motion blurred images of the lung bases. Mild scarring in the lower lobes. Visualized lung bases otherwise clear. Heart markedly enlarged. Biventricular pacemaker with the lead tips in the RIGHT atrial appendage, RV apex and coronary vein.  Hepatobiliary: Normal unenhanced appearance of the liver. Multiple small gallstones in the mildly distended gallbladder, with gallstones also present in what I believe is the cystic duct. No pericholecystic edema/inflammation. No biliary ductal dilation. Pancreas: Mildly atrophic without evidence of mass or peripancreatic inflammation. Spleen: Normal unenhanced appearance. Adrenals/Urinary Tract: Normal appearing adrenal glands. Benign cortical cyst arising from the LOWER pole of the RIGHT kidney. Within the limits of the unenhanced technique, no significant focal parenchymal abnormality involving either kidney. No hydronephrosis. No opaque urinary tract calculi. Normal appearing relatively decompressed urinary bladder. Stomach/Bowel: Normal appearing gas-filled stomach. Clumped small-bowel loops low in the ANTERIOR pelvis, with a fistulous tract of gas extending from a small bowel loop to the skin at the INFERIOR margin of the incisional wound. Small bowel otherwise normal in appearance. Entire colon relatively decompressed. Sigmoid colon diverticulosis without evidence of acute diverticulitis. Vascular/Lymphatic: Severe aorto-iliofemoral atherosclerosis without evidence of aneurysm. Visceral artery atherosclerosis. No pathologic lymphadenopathy. Reproductive: Normal appearing uterus. Partially calcified LEFT adnexal mass as noted on the recent prior CTs. Other: None. Musculoskeletal: Osseous demineralization. Facet degenerative changes throughout the lumbar spine. No acute findings. IMPRESSION: 1. Fistulous communication between a small bowel loop in the LOWER pelvis anteriorly and the skin of the LOWER portion of the incisional wound. 2. Cholelithiasis. Gallstones in what I believe is the cystic duct with mild gallbladder distension, query cystic duct obstruction. 3. Partially calcified LEFT adnexal mass, identified on multiple recent CTs dating back to April, 2020 and unchanged. 4.  Aortic Atherosclerosis  (ICD10-170.0) Electronically Signed   By: Evangeline Dakin M.D.   On: 12/10/2018 22:20   US Abdomen Complete  Result Date: 12/11/2018 CLINICAL DATA:  78 year old female with abdominal pain. Cholelithiasis and choledocholithiasis on recent CT. EXAM: ABDOMEN ULTRASOUND COMPLETE COMPARISON:  12/11/2018 CT FINDINGS: Gallbladder: Cholelithiasis identified, the largest measuring 6 mm. The gallbladder is distended with sludge. No gallbladder wall thickening, sonographic Murphy sign or pericholecystic fluid. Common bile duct: Diameter: 6 mm. No intrahepatic or extrahepatic biliary dilatation. Known choledocholithiasis not well visualized on this exam. Liver: No focal hepatic abnormalities are noted. Slightly heterogeneous hepatic echotexture identified. Portal vein is patent on color Doppler imaging with normal direction of blood flow towards the liver. IVC: No abnormality visualized. Pancreas: Visualized portion unremarkable. Spleen: Size and appearance within normal limits. Right Kidney: Length: 8.7 cm. A 2.3 cm LOWER pole cyst is present. Echogenicity within normal limits. No solid mass or hydronephrosis visualized. Left Kidney: Length: 9.2 cm. Echogenicity within normal limits. No mass or hydronephrosis visualized. Abdominal aorta: Aortic atherosclerotic plaque noted. No evidence of aortic aneurysm. Other findings: None. IMPRESSION: 1. Distended gallbladder with cholelithiasis and sludge. No evidence of gallbladder wall thickening or sonographic Murphy sign to suggest acute cholecystitis at this time. 2. No biliary dilatation. Known choledocholithiasis not well visualized on this examination. 3.  Aortic Atherosclerosis (ICD10-I70.0). Electronically Signed   By: Margarette Canada M.D.   On: 12/11/2018 13:02   Ir Fluoro Guide Cv Line Right  Addendum Date: 12/13/2018   ADDENDUM REPORT: 12/13/2018 08:27 ADDENDUM: The impression should read: Placement of tunneled central venous catheter. Electronically Signed   By: Aletta Edouard M.D.   On: 12/13/2018 08:27   Result Date: 12/13/2018 CLINICAL DATA:  Enterocutaneous fistula and need for central venous catheter for parental nutrition. EXAM: TUNNELED CENTRAL VENOUS CATHETER PLACEMENT WITH ULTRASOUND AND FLUOROSCOPIC GUIDANCE FLUOROSCOPY TIME:  36 seconds.  1.0 mGy. PROCEDURE: The procedure, risks, benefits, and alternatives were explained to the patient. Questions regarding the procedure  were encouraged and answered. The patient understands and consents to the procedure. A time-out was performed prior to initiating the procedure. Ultrasound was used to confirm patency of the right internal jugular vein. The right neck and chest were prepped with chlorhexidine in a sterile fashion, and a sterile drape was applied covering the operative field. Maximum barrier sterile technique with sterile gowns and gloves were used for the procedure. Local anesthesia was provided with 1% lidocaine. After creating a small venotomy incision, a 21 gauge needle was advanced into the right internal jugular vein under direct, real-time ultrasound guidance. Ultrasound image documentation was performed. After securing guidewire access a peel-away sheath was placed over a guide wire. Appropriate catheter length was estimated by guidewire. A 6 French dual-lumen power line was tunneled from the right upper chest wall to the venotomy incision. The catheter was cut to 23 cm based on guidewire measurement. The catheter was then placed through the sheath and the sheath removed. Final catheter positioning was confirmed and documented with a fluoroscopic spot image. The catheter was aspirated and flushed with saline. The catheter exit site was secured with Ethilon and Prolene retention sutures. The venotomy incision was closed with subcutaneous 4-0 Vicryl and Dermabond. COMPLICATIONS: None.  No pneumothorax. FINDINGS: After catheter placement, the tip lies at the cavoatrial junction. The catheter aspirates normally and  is ready for immediate use. IMPRESSION: Placement of non-tunneled central venous catheter via the right internal jugular vein. The catheter tip lies at the cavoatrial junction. The catheter is ready for immediate use. Electronically Signed: By: Aletta Edouard M.D. On: 12/13/2018 08:00   Ir US Guide Vasc Access Right  Addendum Date: 12/13/2018   ADDENDUM REPORT: 12/13/2018 08:27 ADDENDUM: The impression should read: Placement of tunneled central venous catheter. Electronically Signed   By: Aletta Edouard M.D.   On: 12/13/2018 08:27   Result Date: 12/13/2018 CLINICAL DATA:  Enterocutaneous fistula and need for central venous catheter for parental nutrition. EXAM: TUNNELED CENTRAL VENOUS CATHETER PLACEMENT WITH ULTRASOUND AND FLUOROSCOPIC GUIDANCE FLUOROSCOPY TIME:  36 seconds.  1.0 mGy. PROCEDURE: The procedure, risks, benefits, and alternatives were explained to the patient. Questions regarding the procedure were encouraged and answered. The patient understands and consents to the procedure. A time-out was performed prior to initiating the procedure. Ultrasound was used to confirm patency of the right internal jugular vein. The right neck and chest were prepped with chlorhexidine in a sterile fashion, and a sterile drape was applied covering the operative field. Maximum barrier sterile technique with sterile gowns and gloves were used for the procedure. Local anesthesia was provided with 1% lidocaine. After creating a small venotomy incision, a 21 gauge needle was advanced into the right internal jugular vein under direct, real-time ultrasound guidance. Ultrasound image documentation was performed. After securing guidewire access a peel-away sheath was placed over a guide wire. Appropriate catheter length was estimated by guidewire. A 6 French dual-lumen power line was tunneled from the right upper chest wall to the venotomy incision. The catheter was cut to 23 cm based on guidewire measurement. The catheter  was then placed through the sheath and the sheath removed. Final catheter positioning was confirmed and documented with a fluoroscopic spot image. The catheter was aspirated and flushed with saline. The catheter exit site was secured with Ethilon and Prolene retention sutures. The venotomy incision was closed with subcutaneous 4-0 Vicryl and Dermabond. COMPLICATIONS: None.  No pneumothorax. FINDINGS: After catheter placement, the tip lies at the cavoatrial junction. The catheter aspirates  normally and is ready for immediate use. IMPRESSION: Placement of non-tunneled central venous catheter via the right internal jugular vein. The catheter tip lies at the cavoatrial junction. The catheter is ready for immediate use. Electronically Signed: By: Aletta Edouard M.D. On: 12/13/2018 08:00   Dg Swallowing Func-speech Pathology  Result Date: 12/18/2018 Objective Swallowing Evaluation: Type of Study: MBS-Modified Barium Swallow Study  Patient Details Name: [name] MRN: [id number] Date of Birth: 1940-05-07 Today's Date: 12/18/2018 Time: SLP Start Time (ACUTE ONLY): 1330 -SLP Stop Time (ACUTE ONLY): 1400 SLP Time Calculation (min) (ACUTE ONLY): 30 min Past Medical History: Past Medical History: Diagnosis Date  Anemia   Hgb of 9-10  Anemia of chronic disease   Hgb of 9-10 chronically; 06/2010: H&H-10.7/33.5, MCV-81, normal iron studies in 2010   Arteriosclerotic cardiovascular disease (ASCVD)   Remote PTCA by patient report; LBBB; associated cardiomyopathy, presumed ischemic with EF 40-45% previously, 20% in 06/2009; h/o clinical congestive heart failure; negative stress nuclear in 2009 with inferoseptal and apical scar  Automatic implantable cardioverter-defibrillator in situ   Breast cancer (HCC)   breast  Chronic bronchitis (HCC)   Chronic renal disease, stage 3, moderately decreased glomerular filtration rate (GFR) between 30-59 mL/min/1.73 square meter ([place]) 02/01/2016  Congestive heart failure (CHF) (HCC)    Elevated cholesterol   Elevated sed rate   GERD (gastroesophageal reflux disease)   GI bleed   Gout   HOH (hard of hearing)   Hyperlipidemia   Hypertension   Rheumatoid arthritis ([place])   UTI (urinary tract infection)  Past Surgical History: Past Surgical History: Procedure Laterality Date  BI-VENTRICULAR IMPLANTABLE CARDIOVERTER DEFIBRILLATOR N/A 07/28/2012  Procedure: BI-VENTRICULAR IMPLANTABLE CARDIOVERTER DEFIBRILLATOR  (CRT-D);  Surgeon: Evans Lance, MD;  Location: [facility] CATH LAB;  Service: Cardiovascular;  Laterality: N/A;  BI-VENTRICULAR IMPLANTABLE CARDIOVERTER DEFIBRILLATOR  (CRT-D)  07/28/2012  BOWEL RESECTION N/A 08/21/2018  Procedure: Small Bowel Resection;  Surgeon: Jovita Kussmaul, MD;  Location: [place];  Service: General;  Laterality: N/A;  BREAST BIOPSY Bilateral   CATARACT EXTRACTION W/ INTRAOCULAR LENS IMPLANT Left   CATARACT EXTRACTION W/PHACO Right 09/15/2013  Procedure: CATARACT EXTRACTION PHACO AND INTRAOCULAR LENS PLACEMENT ([place]);  Surgeon: Elta Guadeloupe T. Gershon Crane, MD;  [facility];  Service: Ophthalmology;  Laterality: Right;  CDE:  10.74  COLONOSCOPY N/A 03/04/2015  Procedure: COLONOSCOPY;  Surgeon: Rogene Houston, MD;  [facility];  Service: Endoscopy;  Laterality: N/A;  10:50   ESOPHAGOGASTRODUODENOSCOPY N/A 03/04/2015  Procedure: ESOPHAGOGASTRODUODENOSCOPY (EGD);  Surgeon: Rogene Houston, MD;  [facility];  Service: Endoscopy;  Laterality: N/A;  ESOPHAGOGASTRODUODENOSCOPY N/A 09/21/2016  Procedure: ESOPHAGOGASTRODUODENOSCOPY (EGD);  Surgeon: Rogene Houston, MD;  [facility];  Service: Endoscopy;  Laterality: N/A;  GIVENS CAPSULE STUDY N/A 09/22/2016  Procedure: GIVENS CAPSULE STUDY;  Surgeon: Rogene Houston, MD;  [facility];  Service: Endoscopy;  Laterality: N/A;  IR FLUORO GUIDE CV LINE RIGHT  12/12/2018  IR US GUIDE VASC ACCESS RIGHT  12/12/2018  LAPAROTOMY N/A 08/21/2018  Procedure: EXPLORATORY LAPAROTOMY WITH LYSIS OF  ADHESIONS;  Surgeon: Jovita Kussmaul, MD;  Location: [place];  Service: General;  Laterality: N/A;  MASTECTOMY Left [place] Right 08/15/2017  Procedure: TENDON TRANSFER RIGHT WRIST EXTENSORS AS NEEDED, ULNA RESECTION AND STABILIZATION;  Surgeon: Charlotte Crumb, MD;  Location: [place];  Service: Orthopedics;  Laterality: Right;  TOTAL KNEE ARTHROPLASTY Right   Dr.Harrison  TUBAL LIGATION   HPI: Pt is a 78  y.o. female, with history of anemia of chronic disease, ASCVD, AICD placement, history of breast cancer, chronic bronchitis, stage III CKD, systolic heart failure, hyperlipidemia, GERD, gout, hypertension, rheumatoid arthritis who recently had expiratory laparotomy with lysis of adhesions, bowel resection for ischemic bowel in May 2020.  She had large abdominal wound, which is healing by secondary intention.  As per family they noted feces coming from the wound; s/p eakin pouch 8/21. Pt was followed by SLP during recent admission with recommedation for pureed diet, thin liquids, but pt had very little intake.  Assessment / Plan / Recommendation   Pt presents with mild dysphagia, primarily due to oral phase deficits. Due to impaired mastication, pt expectorated regular texture solid. Pt demonstrated decreased bolus cohesion, prolonged transit, and min oral residue of dysphagia 3 soft solids. Also due to decreased bolus cohesion, pt was unable to coordinate deglutition of a barium pill with thin barium; pill was expectorated. Pt's pharyngeal swallow function was [facility] across solid and liquid PO trials. When consuming thin liquids, pt initiated the swallow at the level of the pyriforms. There was one instance of shallow penetration of thin into laryngeal vestibule, which remained above the vocal folds and was ejected during the swallow. Recommend pt initiate dysphagia 2 (minced/ground) diet with thin liquids, meds crushed in puree, and full supervision to assist with use of swallow strategies. Pt would  benefit from continued skilled intervention for dysphagia to ensure diet safety and efficiency while inpatient. CHL IP CLINICAL IMPRESSIONS 12/18/2018 Clinical Impression -- SLP Visit Diagnosis Dysphagia, oropharyngeal phase (R13.12) Attention and concentration deficit following -- Frontal lobe and executive function deficit following -- Impact on safety and function Mild aspiration risk   CHL IP TREATMENT RECOMMENDATION 12/18/2018 Treatment Recommendations Therapy as outlined in treatment plan below   Prognosis 12/18/2018 Prognosis for Safe Diet Advancement Good Barriers to Reach Goals -- Barriers/Prognosis Comment -- [facility] IP DIET RECOMMENDATION 12/18/2018 SLP Diet Recommendations Dysphagia 2 (Fine chop) solids;Thin liquid Liquid Administration via Cup;Straw Medication Administration Crushed with puree Compensations Minimize environmental distractions;Slow rate;Small sips/bites Postural Changes Seated upright at 90 degrees   CHL IP OTHER RECOMMENDATIONS 12/18/2018 Recommended Consults -- Oral Care Recommendations Oral care BID Other Recommendations --   CHL IP FOLLOW UP RECOMMENDATIONS 12/18/2018 Follow up Recommendations 24 hour supervision/assistance   CHL IP FREQUENCY AND DURATION 12/18/2018 Speech Therapy Frequency (ACUTE ONLY) min 2x/week Treatment Duration 2 weeks      CHL IP ORAL PHASE 12/18/2018 Oral Phase Impaired Oral - Pudding Teaspoon -- Oral - Pudding Cup -- Oral - Honey Teaspoon -- Oral - Honey Cup -- Oral - Nectar Teaspoon -- Oral - Nectar Cup -- Oral - Nectar Straw -- Oral - Thin Teaspoon -- Oral - Thin Cup WFL Oral - Thin Straw WFL Oral - Puree -- Oral - Mech Soft Piecemeal swallowing;Impaired mastication;Decreased bolus cohesion Oral - Regular -- Oral - Multi-Consistency Piecemeal swallowing;Decreased bolus cohesion Oral - Pill -- Oral Phase - Comment --  CHL IP PHARYNGEAL PHASE 12/18/2018 Pharyngeal Phase Impaired Pharyngeal- Pudding Teaspoon -- Pharyngeal -- Pharyngeal- Pudding Cup -- Pharyngeal --  Pharyngeal- Honey Teaspoon -- Pharyngeal -- Pharyngeal- Honey Cup -- Pharyngeal -- Pharyngeal- Nectar Teaspoon -- Pharyngeal -- Pharyngeal- Nectar Cup -- Pharyngeal -- Pharyngeal- Nectar Straw -- Pharyngeal -- Pharyngeal- Thin Teaspoon -- Pharyngeal -- Pharyngeal- Thin Cup Delayed swallow initiation-pyriform sinuses;Penetration/Aspiration before swallow Pharyngeal Material enters airway, remains ABOVE vocal cords then ejected out Pharyngeal- Thin Straw WFL Pharyngeal -- Pharyngeal- Puree -- Pharyngeal -- Pharyngeal- Mechanical Soft  Delayed swallow initiation-vallecula;Pharyngeal residue - valleculae Pharyngeal -- Pharyngeal- Regular (No Data) Pharyngeal -- Pharyngeal- Multi-consistency (No Data) Pharyngeal -- Pharyngeal- Pill -- Pharyngeal -- Pharyngeal Comment --  CHL IP CERVICAL ESOPHAGEAL PHASE 12/18/2018 Cervical Esophageal Phase WFL Pudding Teaspoon -- Pudding Cup -- Honey Teaspoon -- Honey Cup -- Nectar Teaspoon -- Nectar Cup -- Nectar Straw -- Thin Teaspoon -- Thin Cup -- Thin Straw -- Puree -- Mechanical Soft -- Regular -- Multi-consistency -- Pill -- Cervical Esophageal Comment -- Arbutus Leas 12/18/2018, 3:50 PM              [place] Ekg Site Rite  Result Date: 12/12/2018 If Site Rite image not attached, placement could not be confirmed due to current cardiac rhythm.  [place] Ekg Site Rite  Result Date: 12/12/2018 If [facility] image not attached, placement could not be confirmed due to current cardiac rhythm.   PERFORMANCE STATUS (ECOG) : 3 - Symptomatic, >50% confined to bed  Review of Systems Unless otherwise noted, a complete review of systems is negative.  Physical Exam General: NAD, frail appearing, thin Cardiovascular: regular rate and rhythm Pulmonary: clear ant fields Abdomen: soft, Eakins pouch noted Extremities: no edema, no joint deformities Skin: no rashes Neurological: Weakness but otherwise nonfocal  IMPRESSION: Patient is lying in bed and comfortable appearing.  No family  present in room.  I attempted to engage with patient to discuss goals.  However, conversation was limited by her severe hearing difficulties and some underlying confusion.  Patient's oral intake continues to be poor.  She was again hypoglycemic this morning with CBG of 61.  I called and spoke with patient's husband by phone.  He says that prior to the hospitalization in May, patient was doing quite well and was fully independent with her own care and still driving.  Following her surgery, patient has been at home and has been has seen her decline over the past several months.  She is mostly in the bed and often refuses even to get up to the wheelchair.  Husband has also noted that she often refused food, drink, and medications.  He feels that she has mostly "given up."  He feels that her quality of life is extremely poor.  Husband confirms DNR.   We talked about possible future decisions including recurrent hospitalizations and artificial nutrition and he seems to recognize that those would not be associated with a meaningful improvement in her quality of life.  We discussed the option of hospice involvement and he seemed interested but overall he is undecided regarding the appropriate path forward.  He says he wants some time to think.  PLAN: -Continue current scope of treatment.  -Husband is thinking about future care including recurrent hospitalizations, facility placement, or hospice care -Will follow   Time Total: 60 minutes  Visit consisted of counseling and education dealing with the complex and emotionally intense issues of symptom management and palliative care in the setting of serious and potentially life-threatening illness.Greater than 50%  of this time was spent counseling and coordinating care related to the above assessment and plan.  Signed by: Altha Harm, PhD, NP-C [phone] (Work Cell)

## 2018-12-20 NOTE — Progress Notes (Signed)
PROGRESS NOTE  FLEDA PAGEL DVV:[id number] DOB: Aug 22, 1940 DOA: 12/10/2018 PCP: [name], MD  Brief History   An Lannan  is a 78 y.o. female, with history of anemia of chronic disease, ASCVD, AICD placement, history of breast cancer, chronic bronchitis, stage III CKD, systolic heart failure, hyperlipidemia, GERD, gout, hypertension, rheumatoid arthritis who recently had expiratory laparotomy with lysis of adhesions, bowel resection for ischemic bowel in May 2020.  She had large abdominal wound, which is healing by secondary intention.  As per family they noted feces coming from the wound.  No abdominal pain or fever.  Patient was brought to the hospital for further evaluation.  Patient is a poor historian.  History obtained from ED records.   CT of the abdomen pelvis was done, no results are available in epic due to technical issues.  ED physician spoke to radiologist verbally about the results.     As per ED physician, there is an enterocutaneous fistula and inflammation around the gallbladder.  No acute cholecystitis.  General surgery, Dr. Rosendo Gros was consulted, he recommended no immediate surgery at this time.  Recommended patient to transfer to [facility] and surgery have been consulted again and have seen patient this morning. The patient now has an Eakins pouch, wound care, and TPN. Cental venous catheter has been placed.  Lynann Beaver pouch has been placed and has had low output. If she continues to have low output, and can be weaned off of TPN to an oral diet, the patient may be discharged soon. However, currently she is only ingesting about 15% of her meals. Calorie count in place.  Surgery has stopped TPN as of 12/18/2018. Patient has been hypoglycemic. D5 NS at 75 cc/hr has been started. This morning her hemoglobin is down to 6.4. She is being transfused with 2 units of PRBC's. Palliative care has been consulted.  She has been evaluated by SLP. She has been placed on a dysphagia 2 diet  with fine chopped meats and this liquids by cup or straw. Staff to assist.  Severe Protein Calorie Malnutrition: I appreciate nutrition's assistance. She is only taking in 50% of required caloric intake.   Palliative care is involved and has discussed the patient with her husband who is considering issues considering future care of the patient including facility placement or hospice care and recurrent hospitalizations. It is a certainty that the patient will have recurrent hospitalizations as her oral intake is not sufficient to support her.  Consultants  . General Surgery - signed off . Wound care . Nutrition . Palliative Care  Procedures  . PICC line placement.  Antibiotics   Anti-infectives (From admission, onward)   Start     Dose/Rate Route Frequency Ordered Stop   12/11/18 1000  piperacillin-tazobactam (ZOSYN) IVPB 3.375 g  Status:  Discontinued     3.375 g 12.5 mL/hr over 240 Minutes Intravenous Every 12 hours 12/10/18 2220 12/11/18 0522   12/11/18 1000  piperacillin-tazobactam (ZOSYN) IVPB 3.375 g  Status:  Discontinued     3.375 g 12.5 mL/hr over 240 Minutes Intravenous Every 12 hours 12/11/18 0524 12/12/18 1122   12/11/18 0800  piperacillin-tazobactam (ZOSYN) IVPB 2.25 g  Status:  Discontinued     2.25 g 100 mL/hr over 30 Minutes Intravenous Every 6 hours 12/11/18 0522 12/11/18 0524   12/10/18 2215  piperacillin-tazobactam (ZOSYN) IVPB 3.375 g     3.375 g 100 mL/hr over 30 Minutes Intravenous  Once 12/10/18 2211 12/10/18 2256      Subjective  The patient is resting comfortably this morning. No new complaints.  Objective   Vitals:  Vitals:   12/20/18 0423 12/20/18 1325  BP: (!) 141/63 (!) 112/53  Pulse: 72 74  Resp: 18 18  Temp: 99 F (37.2 C) 99.1 F (37.3 C)  SpO2: 100% 100%    Exam:  Constitutional:  . The patient is somnolent Nonverbal. No acute distress.  Respiratory:  . No increased work of breathing. . No wheezes, rales, or rhonchi. . No  tactile fremitus. Cardiovascular:  . Regular rate and rhythm. . No murmurs, ectopy, or gallups. . No lateral PMI. No thrills. Abdomen:  . Abdomen is soft, non-tender, non-distended. . No hernias, masses, or organomegaly. . Hypoactive bowel sounds . Eakins pouch in place. Musculoskeletal:  . No cyanosis, clubbing, or edema Skin:  . No rashes, lesions, ulcers . palpation of skin: no induration or nodules Neurologic:  . CN 2-12 intact . Sensation all 4 extremities intact Psychiatric:  . Unable to evaluate as the patient is unable to cooperate with exam.  I have personally reviewed the following:   Today's Data  . CMP, CBC, Vitals  Micro Data  . Nares MSSA and MRSA positive.  Imaging  . CT abdomen: Entero-cutaneous fistula.  Scheduled Meds: . amiodarone  200 mg Oral Daily  . carvedilol  3.125 mg Oral BID WC  . Chlorhexidine Gluconate Cloth  6 each Topical Daily  . feeding supplement (ENSURE ENLIVE)  237 mL Oral TID BM  . heparin  5,000 Units Subcutaneous Q8H  . insulin aspart  0-9 Units Subcutaneous Q4H  . mirtazapine  15 mg Oral QPM  . multivitamin with minerals  1 tablet Oral Daily  . pantoprazole  40 mg Oral Daily  . saccharomyces boulardii  250 mg Oral BID   Continuous Infusions: . sodium chloride 10 mL/hr at 12/16/18 0600  . dextrose 100 mL/hr at 12/20/18 1108    Active Problems:   AKI (acute kidney injury) ([place])   Unstageable pressure ulcer of sacral region [facility])   Enterocutaneous fistula   Palliative care encounter   LOS: 10 days   A & P  Acute kidney injury-patient presented with acute kidney injury with creatinine 2.94, her baseline creatinine as of November 12, 2018 was 1.25.  Started on 0.45% normal saline at 100 mL/h as patient also had hypernatremia with sodium 156. Sodium today is 139. Creatinine is 1.32 today. Follow creatinine, electrolytes, and volume status carefully.   Hypernatremia: Resolved with TPN. Sodium is 133 today.   Anemia: Hemoglobin  is 6.4 this morning. The patient will be transfused with 2 units of PRBC's. FOBT has been ordered.  Loose BM's: One yesterday. Loose BM's expected due to patient's very minimal intake of solid food.  Hypokalemia: Resolved. Monitor and supplement as necessary. Supplement.   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  No immediate surgical intervention required.  General surgery consulted and Eakins pouch in place. They have recommended CT again later this week. Lynann Beaver pouch has been placed and has had low output. If she continues to have low output, the patient may be allowed to eat, per surgery, and discharged soon. I am unable to determine if this fisula is a complication of her surgery or not. Surgery states that the patient is having low output from fistula. They recommend weaning her off of TPN to oral diet. She may then be discharged. However, the patient is currently only taking in 50%  of her nutitional caloric requirements.  Unstageable ulcer of the sacrum and Stage II pressure ulcer on buttock: Present on admission. Wound care consulted.   Cholecystitis-CT scan showed inflammation around gallbladder.  Will obtain abdominal ultrasound in a.m. No surgical intervention is planned.  Zosyn has been discontinued.   History of wide-complex tachycardia/CAD-continue amiodarone, Coreg.   Diabetes mellitus/hypoglycemia: IV D5 with IV 1//2 NS at 100 cc an hour.. Monitor. HbA1c 4.7.  I have seen and examined this patient myself. I have spent 38 minutes in her evaluation and care.  DVT prophylaxis: Heparin Code Status: DNR Family Communication: No family present. I attempted to contact the patient's husband this morning about he drop in hemoglobin. There was no answer. Disposition Plan: tbd. Palliative care has been consulted. Hospice vs SNF. Husband is considering.  Cinde Ebert, DO [facility] Direct contact: see  [email or web address]  7PM-7AM contact night coverage as above 12/20/2018, 4:57 PM  LOS: 1 day

## 2018-12-21 DIAGNOSIS — L89150 Pressure ulcer of sacral region, unstageable: Secondary | ICD-10-CM

## 2018-12-21 LAB — GLUCOSE, CAPILLARY
Glucose-Capillary: 83 mg/dL (ref 70–99)
Glucose-Capillary: 78 mg/dL (ref 70–99)
Glucose-Capillary: 92 mg/dL (ref 70–99)
Glucose-Capillary: 84 mg/dL (ref 70–99)
Glucose-Capillary: 85 mg/dL (ref 70–99)
Glucose-Capillary: 91 mg/dL (ref 70–99)

## 2018-12-21 NOTE — Progress Notes (Signed)
Palliative Medicine   Name: [name] Date: 12/21/2018 MRN: [id number]  DOB: 1940/05/27  Patient Care Team: [name], MD as PCP - General Herminio Commons, MD as PCP - Cardiology (Cardiology) Bo Merino, MD as Consulting Physician (Rheumatology) Evans Lance, MD as Consulting Physician (Cardiology) Rudean Haskell, [facility] as [place] Management (Pharmacist) Adaline Sill, CPhT as [place] Management (Pharmacy Technician)    [place]: Palliative Care consult requested for this 78 y.o. female with multiple medical problems including CAD, systolic heart failure status post AICD placement, CKD 3, chronic bronchitis, RA, who recently had an exploratory laparotomy with lysis of adhesions and bowel resection for ischemic bowel in May 2020.  She had a large open abdominal wound healing by secondary intention.  Patient was readmitted on 12/10/2018 with report of fecal material draining from wound.  Patient was found to have a fistula.  An Eakin's pouch was placed by surgery.  Patient was initially on TPN but that was eventually discontinued.  Oral intake is been markedly poor and patient has been persistently hyperglycemic.  Palliative care was consulted to help address goals.   CODE STATUS: DNR  PAST MEDICAL HISTORY: Past Medical History:  Diagnosis Date   Anemia    Hgb of 9-10   Anemia of chronic disease    Hgb of 9-10 chronically; 06/2010: H&H-10.7/33.5, MCV-81, normal iron studies in 2010    Arteriosclerotic cardiovascular disease (ASCVD)    Remote PTCA by patient report; LBBB; associated cardiomyopathy, presumed ischemic with EF 40-45% previously, 20% in 06/2009; h/o clinical congestive heart failure; negative stress nuclear in 2009 with inferoseptal and apical scar   Automatic implantable cardioverter-defibrillator in situ    Breast cancer (HCC)    breast   Chronic bronchitis (HCC)    Chronic  renal disease, stage 3, moderately decreased glomerular filtration rate (GFR) between 30-59 mL/min/1.73 square meter (HCC) 02/01/2016   Congestive heart failure (CHF) (HCC)    Elevated cholesterol    Elevated sed rate    GERD (gastroesophageal reflux disease)    GI bleed    Gout    HOH (hard of hearing)    Hyperlipidemia    Hypertension    Rheumatoid arthritis ([place])    UTI (urinary tract infection)     PAST SURGICAL HISTORY:  Past Surgical History:  Procedure Laterality Date   BI-VENTRICULAR IMPLANTABLE CARDIOVERTER DEFIBRILLATOR N/A 07/28/2012   Procedure: BI-VENTRICULAR IMPLANTABLE CARDIOVERTER DEFIBRILLATOR  (CRT-D);  Surgeon: Evans Lance, MD;  Location[facility] CATH LAB;  Service: Cardiovascular;  Laterality: N/A;   BI-VENTRICULAR IMPLANTABLE CARDIOVERTER DEFIBRILLATOR  (CRT-D)  07/28/2012   BOWEL RESECTION N/A 08/21/2018   Procedure: Small Bowel Resection;  Surgeon: Jovita Kussmaul, MD;  Location: [place];  Service: General;  Laterality: N/A;   BREAST BIOPSY Bilateral    CATARACT EXTRACTION W/ INTRAOCULAR LENS IMPLANT Left    CATARACT EXTRACTION W/PHACO Right 09/15/2013   Procedure: CATARACT EXTRACTION PHACO AND INTRAOCULAR LENS PLACEMENT ([place]);  Surgeon: Elta Guadeloupe T. Gershon Crane, MD;  Location: [facility];  Service: Ophthalmology;  Laterality: Right;  CDE:  10.74   COLONOSCOPY N/A 03/04/2015   Procedure: COLONOSCOPY;  Surgeon: Rogene Houston, MD;  [facility] END[facility];  Service: Endoscopy;  Laterality: N/A;  10:50    ESOPHAGOGASTRODUODENOSCOPY N/A 03/04/2015   Procedure: ESOPHAGOGASTRODUODENOSCOPY (EGD);  Surgeon: Rogene Houston, MD;  [facility];  Service: Endoscopy;  Laterality: N/A;   ESOPHAGOGASTRODUODENOSCOPY N/A 09/21/2016  Procedure: ESOPHAGOGASTRODUODENOSCOPY (EGD);  Surgeon: Rogene Houston, MD;  [facility];  Service: Endoscopy;  Laterality: N/A;   GIVENS CAPSULE STUDY N/A 09/22/2016   Procedure: GIVENS CAPSULE STUDY;  Surgeon: Rogene Houston, MD;  [facility];  Service: Endoscopy;  Laterality: N/A;   IR FLUORO GUIDE CV LINE RIGHT  12/12/2018   IR US GUIDE VASC ACCESS RIGHT  12/12/2018   LAPAROTOMY N/A 08/21/2018   Procedure: EXPLORATORY LAPAROTOMY WITH LYSIS OF ADHESIONS;  Surgeon: Jovita Kussmaul, MD;  Location: [place];  Service: General;  Laterality: N/A;   MASTECTOMY Left [place] Right 08/15/2017   Procedure: TENDON TRANSFER RIGHT WRIST EXTENSORS AS NEEDED, ULNA RESECTION AND STABILIZATION;  Surgeon: Charlotte Crumb, MD;  Location: [place];  Service: Orthopedics;  Laterality: Right;   TOTAL KNEE ARTHROPLASTY Right    Dr.Harrison   TUBAL LIGATION      HEMATOLOGY/ONCOLOGY HISTORY:  Oncology History  Breast cancer ([place])   Initial Diagnosis   Breast cancer     ALLERGIES:  is allergic to macrobid [nitrofurantoin macrocrystal].  MEDICATIONS:  Current Facility-Administered Medications  Medication Dose Route Frequency Provider Last Rate Last Dose   0.9 %  sodium chloride infusion   Intravenous PRN Swayze, Ava, DO 10 mL/hr at 12/16/18 0600     acetaminophen (TYLENOL) tablet 500 mg  500 mg Oral Q6H PRN Oswald Hillock, MD   500 mg at 12/18/18 1058   albuterol (PROVENTIL) (2.5 MG/3ML) 0.083% nebulizer solution 2.5 mg  2.5 mg Nebulization Q4H PRN Oswald Hillock, MD       amiodarone (PACERONE) tablet 200 mg  200 mg Oral Daily Oswald Hillock, MD   200 mg at 12/20/18 0836   carvedilol (COREG) tablet 3.125 mg  3.125 mg Oral BID WC Oswald Hillock, MD   3.125 mg at 12/20/18 1748   Chlorhexidine Gluconate Cloth 2 % PADS 6 each  6 each Topical Daily Swayze, Ava, DO   6 each at 12/20/18 0947   dextrose 5 % solution   Intravenous Continuous Swayze, Ava, DO 100 mL/hr at 12/20/18 2159     feeding supplement (ENSURE ENLIVE) (ENSURE ENLIVE) liquid 237 mL  237 mL Oral TID BM Swayze, Ava, DO       food thickener (THICK IT) powder   Oral PRN Swayze, Ava, DO       heparin injection 5,000 Units  5,000 Units  Subcutaneous Q8H Oswald Hillock, MD   5,000 Units at 12/21/18 0459   insulin aspart (novoLOG) injection 0-9 Units  0-9 Units Subcutaneous Q4H Oswald Hillock, MD   2 Units at 12/17/18 1720   lidocaine (XYLOCAINE) 1 % (with pres) injection    PRN Aletta Edouard, MD   10 mL at 12/12/18 1752   mirtazapine (REMERON) tablet 15 mg  15 mg Oral QPM Oswald Hillock, MD   15 mg at 12/20/18 1747   multivitamin with minerals tablet 1 tablet  1 tablet Oral Daily Swayze, Ava, DO   1 tablet at 12/20/18 0838   ondansetron (ZOFRAN) tablet 4 mg  4 mg Oral Q6H PRN Oswald Hillock, MD       Or   ondansetron [facility]) injection 4 mg  4 mg Intravenous Q6H PRN Oswald Hillock, MD   4 mg at 12/16/18 4270   oxyCODONE (Oxy IR/ROXICODONE) immediate release tablet 5 mg  5 mg Oral Q6H PRN Oswald Hillock, MD  pantoprazole (PROTONIX) EC tablet 40 mg  40 mg Oral Daily Oswald Hillock, MD   40 mg at 12/20/18 5053   Resource ThickenUp Clear   Oral PRN Saverio Danker, PA-C       saccharomyces boulardii (FLORASTOR) capsule 250 mg  250 mg Oral BID Oswald Hillock, MD   250 mg at 12/20/18 2200   sodium chloride flush (NS) 0.9 % injection 10-40 mL  10-40 mL Intracatheter PRN Swayze, Ava, DO   10 mL at 12/19/18 0446    VITAL SIGNS: BP 139/68 (BP Location: Right Wrist)    Pulse 70    Temp 97.9 F (36.6 C) (Oral)    Resp 16    Ht 5\' 3"  (1.6 m)    Wt 148 lb (67.1 kg)    SpO2 100%    BMI 26.22 kg/m  Filed Weights   12/10/18 2200  Weight: 148 lb (67.1 kg)    Estimated body mass index is 26.22 kg/m as calculated from the following:   Height as of this encounter: 5\' 3"  (1.6 m).   Weight as of this encounter: 148 lb (67.1 kg).  LABS: CBC:    Component Value Date/Time   WBC 7.2 12/20/2018 0419   HGB 10.1 (L) 12/20/2018 0419   HCT 31.0 (L) 12/20/2018 0419   PLT 119 (L) 12/20/2018 0419   MCV 85.4 12/20/2018 0419   NEUTROABS 4.1 12/20/2018 0419   LYMPHSABS 1.8 12/20/2018 0419   MONOABS 0.8 12/20/2018 0419   EOSABS 0.4  12/20/2018 0419   BASOSABS 0.0 12/20/2018 0419   Comprehensive Metabolic Panel:    Component Value Date/Time   NA 134 (L) 12/20/2018 0419   K 4.6 12/20/2018 0419   CL 104 12/20/2018 0419   CO2 23 12/20/2018 0419   BUN 36 (H) 12/20/2018 0419   CREATININE 1.32 (H) 12/20/2018 0419   CREATININE 1.07 (H) 02/13/2018 1142   GLUCOSE 83 12/20/2018 0419   CALCIUM 7.7 (L) 12/20/2018 0419   AST 33 12/20/2018 0419   ALT 21 12/20/2018 0419   ALKPHOS 163 (H) 12/20/2018 0419   BILITOT 0.9 12/20/2018 0419   PROT 5.1 (L) 12/20/2018 0419   ALBUMIN 1.3 (L) 12/20/2018 0419    RADIOGRAPHIC STUDIES: Ct Abdomen Pelvis Wo Contrast  Result Date: 12/10/2018 CLINICAL DATA:  78 year old who underwent bowel resection for ischemia in April, 2020, presenting now with possible wound infection as there is drainage from the INFERIOR aspect of the incision. Intravenous contrast was not administered due to the patient's renal insufficiency with creatinine of 1.89 and estimated GFR of 29. EXAM: CT ABDOMEN AND PELVIS WITHOUT CONTRAST TECHNIQUE: Multidetector CT imaging of the abdomen and pelvis was performed following the standard protocol without IV contrast. COMPARISON:  09/09/2018 and earlier. FINDINGS: Lower chest: Respiratory motion blurred images of the lung bases. Mild scarring in the lower lobes. Visualized lung bases otherwise clear. Heart markedly enlarged. Biventricular pacemaker with the lead tips in the RIGHT atrial appendage, RV apex and coronary vein. Hepatobiliary: Normal unenhanced appearance of the liver. Multiple small gallstones in the mildly distended gallbladder, with gallstones also present in what I believe is the cystic duct. No pericholecystic edema/inflammation. No biliary ductal dilation. Pancreas: Mildly atrophic without evidence of mass or peripancreatic inflammation. Spleen: Normal unenhanced appearance. Adrenals/Urinary Tract: Normal appearing adrenal glands. Benign cortical cyst arising from the  LOWER pole of the RIGHT kidney. Within the limits of the unenhanced technique, no significant focal parenchymal abnormality involving either kidney. No hydronephrosis. No opaque urinary tract  calculi. Normal appearing relatively decompressed urinary bladder. Stomach/Bowel: Normal appearing gas-filled stomach. Clumped small-bowel loops low in the ANTERIOR pelvis, with a fistulous tract of gas extending from a small bowel loop to the skin at the INFERIOR margin of the incisional wound. Small bowel otherwise normal in appearance. Entire colon relatively decompressed. Sigmoid colon diverticulosis without evidence of acute diverticulitis. Vascular/Lymphatic: Severe aorto-iliofemoral atherosclerosis without evidence of aneurysm. Visceral artery atherosclerosis. No pathologic lymphadenopathy. Reproductive: Normal appearing uterus. Partially calcified LEFT adnexal mass as noted on the recent prior CTs. Other: None. Musculoskeletal: Osseous demineralization. Facet degenerative changes throughout the lumbar spine. No acute findings. IMPRESSION: 1. Fistulous communication between a small bowel loop in the LOWER pelvis anteriorly and the skin of the LOWER portion of the incisional wound. 2. Cholelithiasis. Gallstones in what I believe is the cystic duct with mild gallbladder distension, query cystic duct obstruction. 3. Partially calcified LEFT adnexal mass, identified on multiple recent CTs dating back to April, 2020 and unchanged. 4.  Aortic Atherosclerosis (ICD10-170.0) Electronically Signed   By: Evangeline Dakin M.D.   On: 12/10/2018 22:20   US Abdomen Complete  Result Date: 12/11/2018 CLINICAL DATA:  78 year old female with abdominal pain. Cholelithiasis and choledocholithiasis on recent CT. EXAM: ABDOMEN ULTRASOUND COMPLETE COMPARISON:  12/11/2018 CT FINDINGS: Gallbladder: Cholelithiasis identified, the largest measuring 6 mm. The gallbladder is distended with sludge. No gallbladder wall thickening, sonographic  Murphy sign or pericholecystic fluid. Common bile duct: Diameter: 6 mm. No intrahepatic or extrahepatic biliary dilatation. Known choledocholithiasis not well visualized on this exam. Liver: No focal hepatic abnormalities are noted. Slightly heterogeneous hepatic echotexture identified. Portal vein is patent on color Doppler imaging with normal direction of blood flow towards the liver. IVC: No abnormality visualized. Pancreas: Visualized portion unremarkable. Spleen: Size and appearance within normal limits. Right Kidney: Length: 8.7 cm. A 2.3 cm LOWER pole cyst is present. Echogenicity within normal limits. No solid mass or hydronephrosis visualized. Left Kidney: Length: 9.2 cm. Echogenicity within normal limits. No mass or hydronephrosis visualized. Abdominal aorta: Aortic atherosclerotic plaque noted. No evidence of aortic aneurysm. Other findings: None. IMPRESSION: 1. Distended gallbladder with cholelithiasis and sludge. No evidence of gallbladder wall thickening or sonographic Murphy sign to suggest acute cholecystitis at this time. 2. No biliary dilatation. Known choledocholithiasis not well visualized on this examination. 3.  Aortic Atherosclerosis (ICD10-I70.0). Electronically Signed   By: Margarette Canada M.D.   On: 12/11/2018 13:02   Ir Fluoro Guide Cv Line Right  Addendum Date: 12/13/2018   ADDENDUM REPORT: 12/13/2018 08:27 ADDENDUM: The impression should read: Placement of tunneled central venous catheter. Electronically Signed   By: Aletta Edouard M.D.   On: 12/13/2018 08:27   Result Date: 12/13/2018 CLINICAL DATA:  Enterocutaneous fistula and need for central venous catheter for parental nutrition. EXAM: TUNNELED CENTRAL VENOUS CATHETER PLACEMENT WITH ULTRASOUND AND FLUOROSCOPIC GUIDANCE FLUOROSCOPY TIME:  36 seconds.  1.0 mGy. PROCEDURE: The procedure, risks, benefits, and alternatives were explained to the patient. Questions regarding the procedure were encouraged and answered. The patient  understands and consents to the procedure. A time-out was performed prior to initiating the procedure. Ultrasound was used to confirm patency of the right internal jugular vein. The right neck and chest were prepped with chlorhexidine in a sterile fashion, and a sterile drape was applied covering the operative field. Maximum barrier sterile technique with sterile gowns and gloves were used for the procedure. Local anesthesia was provided with 1% lidocaine. After creating a small venotomy incision, a 21 gauge needle  was advanced into the right internal jugular vein under direct, real-time ultrasound guidance. Ultrasound image documentation was performed. After securing guidewire access a peel-away sheath was placed over a guide wire. Appropriate catheter length was estimated by guidewire. A 6 French dual-lumen power line was tunneled from the right upper chest wall to the venotomy incision. The catheter was cut to 23 cm based on guidewire measurement. The catheter was then placed through the sheath and the sheath removed. Final catheter positioning was confirmed and documented with a fluoroscopic spot image. The catheter was aspirated and flushed with saline. The catheter exit site was secured with Ethilon and Prolene retention sutures. The venotomy incision was closed with subcutaneous 4-0 Vicryl and Dermabond. COMPLICATIONS: None.  No pneumothorax. FINDINGS: After catheter placement, the tip lies at the cavoatrial junction. The catheter aspirates normally and is ready for immediate use. IMPRESSION: Placement of non-tunneled central venous catheter via the right internal jugular vein. The catheter tip lies at the cavoatrial junction. The catheter is ready for immediate use. Electronically Signed: By: Aletta Edouard M.D. On: 12/13/2018 08:00   Ir US Guide Vasc Access Right  Addendum Date: 12/13/2018   ADDENDUM REPORT: 12/13/2018 08:27 ADDENDUM: The impression should read: Placement of tunneled central venous  catheter. Electronically Signed   By: Aletta Edouard M.D.   On: 12/13/2018 08:27   Result Date: 12/13/2018 CLINICAL DATA:  Enterocutaneous fistula and need for central venous catheter for parental nutrition. EXAM: TUNNELED CENTRAL VENOUS CATHETER PLACEMENT WITH ULTRASOUND AND FLUOROSCOPIC GUIDANCE FLUOROSCOPY TIME:  36 seconds.  1.0 mGy. PROCEDURE: The procedure, risks, benefits, and alternatives were explained to the patient. Questions regarding the procedure were encouraged and answered. The patient understands and consents to the procedure. A time-out was performed prior to initiating the procedure. Ultrasound was used to confirm patency of the right internal jugular vein. The right neck and chest were prepped with chlorhexidine in a sterile fashion, and a sterile drape was applied covering the operative field. Maximum barrier sterile technique with sterile gowns and gloves were used for the procedure. Local anesthesia was provided with 1% lidocaine. After creating a small venotomy incision, a 21 gauge needle was advanced into the right internal jugular vein under direct, real-time ultrasound guidance. Ultrasound image documentation was performed. After securing guidewire access a peel-away sheath was placed over a guide wire. Appropriate catheter length was estimated by guidewire. A 6 French dual-lumen power line was tunneled from the right upper chest wall to the venotomy incision. The catheter was cut to 23 cm based on guidewire measurement. The catheter was then placed through the sheath and the sheath removed. Final catheter positioning was confirmed and documented with a fluoroscopic spot image. The catheter was aspirated and flushed with saline. The catheter exit site was secured with Ethilon and Prolene retention sutures. The venotomy incision was closed with subcutaneous 4-0 Vicryl and Dermabond. COMPLICATIONS: None.  No pneumothorax. FINDINGS: After catheter placement, the tip lies at the cavoatrial  junction. The catheter aspirates normally and is ready for immediate use. IMPRESSION: Placement of non-tunneled central venous catheter via the right internal jugular vein. The catheter tip lies at the cavoatrial junction. The catheter is ready for immediate use. Electronically Signed: By: Aletta Edouard M.D. On: 12/13/2018 08:00   Dg Swallowing Func-speech Pathology  Result Date: 12/18/2018 Objective Swallowing Evaluation: Type of Study: MBS-Modified Barium Swallow Study  Patient Details Name: PEG FIFER MRN: [id number] Date of Birth: [birth year] Today's Date: 12/18/2018 Time: SLP Start Time (ACUTE ONLY):  1330 -SLP Stop Time (ACUTE ONLY): 1400 SLP Time Calculation (min) (ACUTE ONLY): 30 min Past Medical History: Past Medical History: Diagnosis Date  Anemia   Hgb of 9-10  Anemia of chronic disease   Hgb of 9-10 chronically; 06/2010: H&H-10.7/33.5, MCV-81, normal iron studies in 2010   Arteriosclerotic cardiovascular disease (ASCVD)   Remote PTCA by patient report; LBBB; associated cardiomyopathy, presumed ischemic with EF 40-45% previously, 20% in 06/2009; h/o clinical congestive heart failure; negative stress nuclear in 2009 with inferoseptal and apical scar  Automatic implantable cardioverter-defibrillator in situ   Breast cancer (HCC)   breast  Chronic bronchitis (HCC)   Chronic renal disease, stage 3, moderately decreased glomerular filtration rate (GFR) between 30-59 mL/min/1.73 square meter (HCC) 02/01/2016  Congestive heart failure (CHF) (HCC)   Elevated cholesterol   Elevated sed rate   GERD (gastroesophageal reflux disease)   GI bleed   Gout   HOH (hard of hearing)   Hyperlipidemia   Hypertension   Rheumatoid arthritis ([place])   UTI (urinary tract infection)  Past Surgical History: Past Surgical History: Procedure Laterality Date  BI-VENTRICULAR IMPLANTABLE CARDIOVERTER DEFIBRILLATOR N/A 07/28/2012  Procedure: BI-VENTRICULAR IMPLANTABLE CARDIOVERTER DEFIBRILLATOR  (CRT-D);  Surgeon: Evans Lance, MD;  Location: [facility] CATH LAB;  Service: Cardiovascular;  Laterality: N/A;  BI-VENTRICULAR IMPLANTABLE CARDIOVERTER DEFIBRILLATOR  (CRT-D)  07/28/2012  BOWEL RESECTION N/A 08/21/2018  Procedure: Small Bowel Resection;  Surgeon: Jovita Kussmaul, MD;  Location: [place];  Service: General;  Laterality: N/A;  BREAST BIOPSY Bilateral   CATARACT EXTRACTION W/ INTRAOCULAR LENS IMPLANT Left   CATARACT EXTRACTION W/PHACO Right 09/15/2013  Procedure: CATARACT EXTRACTION PHACO AND INTRAOCULAR LENS PLACEMENT ([place]);  Surgeon: Elta Guadeloupe T. Gershon Crane, MD;  Location: [facility];  Service: Ophthalmology;  Laterality: Right;  CDE:  10.74  COLONOSCOPY N/A 03/04/2015  Procedure: COLONOSCOPY;  Surgeon: Rogene Houston, MD;  [facility];  Service: Endoscopy;  Laterality: N/A;  10:50   ESOPHAGOGASTRODUODENOSCOPY N/A 03/04/2015  Procedure: ESOPHAGOGASTRODUODENOSCOPY (EGD);  Surgeon: Rogene Houston, MD;  [facility];  Service: Endoscopy;  Laterality: N/A;  ESOPHAGOGASTRODUODENOSCOPY N/A 09/21/2016  Procedure: ESOPHAGOGASTRODUODENOSCOPY (EGD);  Surgeon: Rogene Houston, MD;  [facility];  Service: Endoscopy;  Laterality: N/A;  GIVENS CAPSULE STUDY N/A 09/22/2016  Procedure: GIVENS CAPSULE STUDY;  Surgeon: Rogene Houston, MD;  [facility];  Service: Endoscopy;  Laterality: N/A;  IR FLUORO GUIDE CV LINE RIGHT  12/12/2018  IR US GUIDE VASC ACCESS RIGHT  12/12/2018  LAPAROTOMY N/A 08/21/2018  Procedure: EXPLORATORY LAPAROTOMY WITH LYSIS OF ADHESIONS;  Surgeon: Jovita Kussmaul, MD;  Location: [place];  Service: General;  Laterality: N/A;  MASTECTOMY Left [place] Right 08/15/2017  Procedure: TENDON TRANSFER RIGHT WRIST EXTENSORS AS NEEDED, ULNA RESECTION AND STABILIZATION;  Surgeon: Charlotte Crumb, MD;  Location: [place];  Service: Orthopedics;  Laterality: Right;  TOTAL KNEE ARTHROPLASTY Right   Dr.Harrison  TUBAL LIGATION   HPI: Pt is a 78 y.o. female, with history of anemia of chronic  disease, ASCVD, AICD placement, history of breast cancer, chronic bronchitis, stage III CKD, systolic heart failure, hyperlipidemia, GERD, gout, hypertension, rheumatoid arthritis who recently had expiratory laparotomy with lysis of adhesions, bowel resection for ischemic bowel in May 2020.  She had large abdominal wound, which is healing by secondary intention.  As per family they noted feces coming from the wound; s/p eakin pouch 8/21. Pt was followed by SLP during recent admission with recommedation for pureed diet,  thin liquids, but pt had very little intake.  Assessment / Plan / Recommendation   Pt presents with mild dysphagia, primarily due to oral phase deficits. Due to impaired mastication, pt expectorated regular texture solid. Pt demonstrated decreased bolus cohesion, prolonged transit, and min oral residue of dysphagia 3 soft solids. Also due to decreased bolus cohesion, pt was unable to coordinate deglutition of a barium pill with thin barium; pill was expectorated. Pt's pharyngeal swallow function was [facility] across solid and liquid PO trials. When consuming thin liquids, pt initiated the swallow at the level of the pyriforms. There was one instance of shallow penetration of thin into laryngeal vestibule, which remained above the vocal folds and was ejected during the swallow. Recommend pt initiate dysphagia 2 (minced/ground) diet with thin liquids, meds crushed in puree, and full supervision to assist with use of swallow strategies. Pt would benefit from continued skilled intervention for dysphagia to ensure diet safety and efficiency while inpatient. CHL IP CLINICAL IMPRESSIONS 12/18/2018 Clinical Impression -- SLP Visit Diagnosis Dysphagia, oropharyngeal phase (R13.12) Attention and concentration deficit following -- Frontal lobe and executive function deficit following -- Impact on safety and function Mild aspiration risk   CHL IP TREATMENT RECOMMENDATION 12/18/2018 Treatment Recommendations Therapy as  outlined in treatment plan below   Prognosis 12/18/2018 Prognosis for Safe Diet Advancement Good Barriers to Reach Goals -- Barriers/Prognosis Comment -- CHL IP DIET RECOMMENDATION 12/18/2018 SLP Diet Recommendations Dysphagia 2 (Fine chop) solids;Thin liquid Liquid Administration via Cup;Straw Medication Administration Crushed with puree Compensations Minimize environmental distractions;Slow rate;Small sips/bites Postural Changes Seated upright at 90 degrees   CHL IP OTHER RECOMMENDATIONS 12/18/2018 Recommended Consults -- Oral Care Recommendations Oral care BID Other Recommendations --   CHL IP FOLLOW UP RECOMMENDATIONS 12/18/2018 Follow up Recommendations 24 hour supervision/assistance   CHL IP FREQUENCY AND DURATION 12/18/2018 Speech Therapy Frequency (ACUTE ONLY) min 2x/week Treatment Duration 2 weeks      CHL IP ORAL PHASE 12/18/2018 Oral Phase Impaired Oral - Pudding Teaspoon -- Oral - Pudding Cup -- Oral - Honey Teaspoon -- Oral - Honey Cup -- Oral - Nectar Teaspoon -- Oral - Nectar Cup -- Oral - Nectar Straw -- Oral - Thin Teaspoon -- Oral - Thin Cup WFL Oral - Thin Straw WFL Oral - Puree -- Oral - Mech Soft Piecemeal swallowing;Impaired mastication;Decreased bolus cohesion Oral - Regular -- Oral - Multi-Consistency Piecemeal swallowing;Decreased bolus cohesion Oral - Pill -- Oral Phase - Comment --  CHL IP PHARYNGEAL PHASE 12/18/2018 Pharyngeal Phase Impaired Pharyngeal- Pudding Teaspoon -- Pharyngeal -- Pharyngeal- Pudding Cup -- Pharyngeal -- Pharyngeal- Honey Teaspoon -- Pharyngeal -- Pharyngeal- Honey Cup -- Pharyngeal -- Pharyngeal- Nectar Teaspoon -- Pharyngeal -- Pharyngeal- Nectar Cup -- Pharyngeal -- Pharyngeal- Nectar Straw -- Pharyngeal -- Pharyngeal- Thin Teaspoon -- Pharyngeal -- Pharyngeal- Thin Cup Delayed swallow initiation-pyriform sinuses;Penetration/Aspiration before swallow Pharyngeal Material enters airway, remains ABOVE vocal cords then ejected out Pharyngeal- Thin Straw WFL Pharyngeal --  Pharyngeal- Puree -- Pharyngeal -- Pharyngeal- Mechanical Soft Delayed swallow initiation-vallecula;Pharyngeal residue - valleculae Pharyngeal -- Pharyngeal- Regular (No Data) Pharyngeal -- Pharyngeal- Multi-consistency (No Data) Pharyngeal -- Pharyngeal- Pill -- Pharyngeal -- Pharyngeal Comment --  CHL IP CERVICAL ESOPHAGEAL PHASE 12/18/2018 Cervical Esophageal Phase WFL Pudding Teaspoon -- Pudding Cup -- Honey Teaspoon -- Honey Cup -- Nectar Teaspoon -- Nectar Cup -- Nectar Straw -- Thin Teaspoon -- Thin Cup -- Thin Straw -- Puree -- Mechanical Soft -- Regular -- Multi-consistency -- Pill -- Cervical Esophageal Comment -- Arbutus Leas  12/18/2018, 3:50 PM              [place] Ekg Site Rite  Result Date: 12/12/2018 If [facility] image not attached, placement could not be confirmed due to current cardiac rhythm.  [place] Ekg Site Rite  Result Date: 12/12/2018 If [facility] image not attached, placement could not be confirmed due to current cardiac rhythm.   PERFORMANCE STATUS (ECOG) : 4 - Bedbound  Review of Systems Unless otherwise noted, a complete review of systems is negative.  Physical Exam General: NAD, frail appearing, thin Cardiovascular: regular rate and rhythm Pulmonary: clear ant fields Abdomen: wound/bag noted Extremities: no edema, no joint deformities Skin: no rashes Neurological: Weakness, extremely hard of hearing  IMPRESSION: No significant clinical changes overnight. Oral intake remains poor. RN says that patient is only eating sips/bites. High risk of overall decline over time. Communication with patient remains difficult due to her hearing deficit.  I again called and spoke with patient's husband by phone. I updated him on patient's status. He says that he remains undecided on decisions. He initially said that we should "wait it out" but I explained that I did not feel that time is likely to make a significant improvement and that patient remains high risk of continued decline over  time. We again discussed decision making including artificial nutrition, aggressive care, future hospitalizations, or a focus on comfort and hospice. He seemed to be leaning towards hospice but was not sure where he would want her to be. Patient would likely not be eligible for hospice at an SNF because she is immediately without a payor source for a LTC bed (no Medicaid). Residential hospice could be a consideration given her limited oral intake if husband opts just to focus on comfort/end of life care but it did not seem like he was fully committed to the idea. Alternatively, hospice at home would be an option.   I will ask that our team continue to follow and work with husband on decision making/goals.   PLAN: -Continue current scope of treatment -Will continue to work with family on decision making/goals   Time Total: 30 minutes  Visit consisted of counseling and education dealing with the complex and emotionally intense issues of symptom management and palliative care in the setting of serious and potentially life-threatening illness.Greater than 50%  of this time was spent counseling and coordinating care related to the above assessment and plan.  Signed by: Altha Harm, PhD, NP-C [phone] (Work Cell)

## 2018-12-21 NOTE — Progress Notes (Signed)
PROGRESS NOTE  TEKILA CAILLOUET YKD:[id number] DOB: [birth year] DOA: 12/10/2018 PCP: [name], MD  Brief History   [name]  is a 78 y.o. female, with history of anemia of chronic disease, ASCVD, AICD placement, history of breast cancer, chronic bronchitis, stage III CKD, systolic heart failure, hyperlipidemia, GERD, gout, hypertension, rheumatoid arthritis who recently had expiratory laparotomy with lysis of adhesions, bowel resection for ischemic bowel in May 2020.  She had large abdominal wound, which is healing by secondary intention.  As per family they noted feces coming from the wound.  No abdominal pain or fever.  Patient was brought to the hospital for further evaluation.  Patient is a poor historian.  History obtained from ED records.   CT of the abdomen pelvis was done, no results are available in epic due to technical issues.  ED physician spoke to radiologist verbally about the results.     As per ED physician, there is an enterocutaneous fistula and inflammation around the gallbladder.  No acute cholecystitis.  General surgery, Dr. Rosendo Gros was consulted, he recommended no immediate surgery at this time.  Recommended patient to transfer to [facility] and surgery have been consulted again and have seen patient this morning. The patient now has an Eakins pouch, wound care, and TPN. Cental venous catheter has been placed.  Lynann Beaver pouch has been placed and has had low output. If she continues to have low output, and can be weaned off of TPN to an oral diet, the patient may be discharged soon. However, currently she is only ingesting about 15% of her meals. Calorie count in place.  Surgery has stopped TPN as of 12/18/2018. Patient has been hypoglycemic. D5 NS at 75 cc/hr has been started. This morning her hemoglobin is down to 6.4. She is being transfused with 2 units of PRBC's. Palliative care has been consulted.  She has been evaluated by SLP. She has been placed on a dysphagia 2 diet  with fine chopped meats and this liquids by cup or straw. Staff to assist.  Severe Protein Calorie Malnutrition: I appreciate nutrition's assistance. She is only taking in 50% of required caloric intake.   Palliative care is involved and has discussed the patient with her husband who is considering issues considering future care of the patient including facility placement or hospice care and recurrent hospitalizations. It is a certainty that the patient will have recurrent hospitalizations as her oral intake is not sufficient to support her.  12/21/2018: Patient seen.  No new changes.  Consultants  . General Surgery - signed off . Wound care . Nutrition . Palliative Care  Procedures  . PICC line placement.  Antibiotics   Anti-infectives (From admission, onward)   Start     Dose/Rate Route Frequency Ordered Stop   12/11/18 1000  piperacillin-tazobactam (ZOSYN) IVPB 3.375 g  Status:  Discontinued     3.375 g 12.5 mL/hr over 240 Minutes Intravenous Every 12 hours 12/10/18 2220 12/11/18 0522   12/11/18 1000  piperacillin-tazobactam (ZOSYN) IVPB 3.375 g  Status:  Discontinued     3.375 g 12.5 mL/hr over 240 Minutes Intravenous Every 12 hours 12/11/18 0524 12/12/18 1122   12/11/18 0800  piperacillin-tazobactam (ZOSYN) IVPB 2.25 g  Status:  Discontinued     2.25 g 100 mL/hr over 30 Minutes Intravenous Every 6 hours 12/11/18 0522 12/11/18 0524   12/10/18 2215  piperacillin-tazobactam (ZOSYN) IVPB 3.375 g     3.375 g 100 mL/hr over 30 Minutes Intravenous  Once 12/10/18 2211  12/10/18 2256      Subjective  No complaints.   No fever or chills.   No nausea or vomiting.   No abdominal pain.    Objective   Vitals:  Vitals:   12/21/18 0402 12/21/18 1230  BP: 139/68 (!) 143/76  Pulse: 70 70  Resp: 16 18  Temp: 97.9 F (36.6 C) 98.6 F (37 C)  SpO2: 100% 98%    Exam:  Constitutional:  . The patient is awake and alert.  Patient is not in any distress.  Patient is a poor  historian. Respiratory:  Clear to auscultation.   Cardiovascular:  . S1-S2. Abdomen:  . Abdomen is obese, soft and nontender.   Lynann Beaver pouch in place. Neurologic:  . Awake and alert.   Patient moves all extremities.    Today's Data  . CMP, CBC, Vitals  Micro Data  . Nares MSSA and MRSA positive.  Imaging  . CT abdomen: Entero-cutaneous fistula.  Scheduled Meds: . amiodarone  200 mg Oral Daily  . carvedilol  3.125 mg Oral BID WC  . Chlorhexidine Gluconate Cloth  6 each Topical Daily  . feeding supplement (ENSURE ENLIVE)  237 mL Oral TID BM  . heparin  5,000 Units Subcutaneous Q8H  . insulin aspart  0-9 Units Subcutaneous Q4H  . mirtazapine  15 mg Oral QPM  . multivitamin with minerals  1 tablet Oral Daily  . pantoprazole  40 mg Oral Daily  . saccharomyces boulardii  250 mg Oral BID   Continuous Infusions: . sodium chloride 10 mL/hr at 12/16/18 0600  . dextrose 100 mL/hr at 12/21/18 1301    Active Problems:   AKI (acute kidney injury) ([place])   Unstageable pressure ulcer of sacral region [facility])   Enterocutaneous fistula   Palliative care encounter   LOS: 11 days   A & P  Acute kidney injury-patient presented with acute kidney injury with creatinine 2.94, her baseline creatinine as of November 12, 2018 was 1.25.  Started on 0.45% normal saline at 100 mL/h as patient also had hypernatremia with sodium 156. Sodium today is 139. Creatinine is 1.32 today. Follow creatinine, electrolytes, and volume status carefully. 12/21/2018: Renal function is back to baseline.  Continue to monitor renal function and electrolytes.   Hypernatremia: Resolved   Anemia:  Last hemoglobin check was 10.1 g/dL.   Fecal occult blood is negative.  Hypokalemia:  Resolved.  Monitor and supplement as necessary. Supplement.   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  No immediate surgical intervention required.  General surgery  consulted and Eakins pouch in place. They have recommended CT again later this week. Lynann Beaver pouch has been placed and has had low output. If she continues to have low output, the patient may be allowed to eat, per surgery, and discharged soon. I am unable to determine if this fisula is a complication of her surgery or not. Surgery states that the patient is having low output from fistula. They recommend weaning her off of TPN to oral diet. She may then be discharged. However, the patient is currently only taking in 50% of her nutitional caloric requirements.  Unstageable ulcer of the sacrum and Stage II pressure ulcer on buttock: Present on admission. Wound care consulted.   Cholelithiasis without cholecystitis:  Continue to manage expectantly.   Ultrasound abdomen result reviewed. CT abdomen and pelvis result reviewed  History of wide-complex tachycardia/CAD-continue amiodarone, Coreg.   Diabetes mellitus/hypoglycemia:  IVF  D5W at 100 cc an hour.  Monitor. HbA1c 4.7.  DVT prophylaxis: Heparin Code Status: DNR Family Communication: No family present. I attempted to contact the patient's husband this morning about he drop in hemoglobin. There was no answer. Disposition Plan: tbd. Palliative care has been consulted. Hospice vs SNF. Husband is considering.  Dana Allan, MD   [facility] Direct contact: see [email or web address]  7PM-7AM contact night coverage as above 12/20/2018, 4:57 PM  LOS: 1 day

## 2018-12-21 NOTE — Plan of Care (Signed)
  Problem: Pain Managment: Goal: General experience of comfort will improve Outcome: Progressing   Problem: Safety: Goal: Ability to remain free from injury will improve Outcome: Progressing   Problem: Skin Integrity: Goal: Risk for impaired skin integrity will decrease Outcome: Progressing   

## 2018-12-22 ENCOUNTER — Other Ambulatory Visit: Payer: Self-pay | Admitting: Pharmacist

## 2018-12-22 LAB — RENAL FUNCTION PANEL
Albumin: 1.2 g/dL — ABNORMAL LOW (ref 3.5–5.0)
Anion gap: 6 (ref 5–15)
BUN: 16 mg/dL (ref 8–23)
CO2: 24 mmol/L (ref 22–32)
Calcium: 7.8 mg/dL — ABNORMAL LOW (ref 8.9–10.3)
Chloride: 104 mmol/L (ref 98–111)
Creatinine, Ser: 1.22 mg/dL — ABNORMAL HIGH (ref 0.44–1.00)
GFR calc Af Amer: 48 mL/min — ABNORMAL LOW (ref >60)
GFR calc non Af Amer: 42 mL/min — ABNORMAL LOW (ref >60)
Glucose, Bld: 110 mg/dL — ABNORMAL HIGH (ref 70–99)
Phosphorus: 3.5 mg/dL (ref 2.5–4.6)
Potassium: 4 mmol/L (ref 3.5–5.1)
Sodium: 134 mmol/L — ABNORMAL LOW (ref 135–145)

## 2018-12-22 LAB — CBC WITH DIFFERENTIAL/PLATELET
Abs Immature Granulocytes: 0.02 10*3/uL (ref 0.00–0.07)
Basophils Absolute: 0 10*3/uL (ref 0.0–0.1)
Basophils Relative: 0 %
Eosinophils Absolute: 0.2 10*3/uL (ref 0.0–0.5)
Eosinophils Relative: 4 %
HCT: 33.4 % — ABNORMAL LOW (ref 36.0–46.0)
Hemoglobin: 10.6 g/dL — ABNORMAL LOW (ref 12.0–15.0)
Immature Granulocytes: 0 %
Lymphocytes Relative: 23 %
Lymphs Abs: 1.3 10*3/uL (ref 0.7–4.0)
MCH: 27.7 pg (ref 26.0–34.0)
MCHC: 31.7 g/dL (ref 30.0–36.0)
MCV: 87.2 fL (ref 80.0–100.0)
Monocytes Absolute: 0.6 10*3/uL (ref 0.1–1.0)
Monocytes Relative: 11 %
Neutro Abs: 3.4 10*3/uL (ref 1.7–7.7)
Neutrophils Relative %: 62 %
Platelets: 155 10*3/uL (ref 150–400)
RBC: 3.83 MIL/uL — ABNORMAL LOW (ref 3.87–5.11)
RDW: 19.7 % — ABNORMAL HIGH (ref 11.5–15.5)
WBC: 5.6 10*3/uL (ref 4.0–10.5)
nRBC: 0 % (ref 0.0–0.2)

## 2018-12-22 LAB — GLUCOSE, CAPILLARY
Glucose-Capillary: 80 mg/dL (ref 70–99)
Glucose-Capillary: 76 mg/dL (ref 70–99)
Glucose-Capillary: 93 mg/dL (ref 70–99)
Glucose-Capillary: 93 mg/dL (ref 70–99)
Glucose-Capillary: 107 mg/dL — ABNORMAL HIGH (ref 70–99)

## 2018-12-22 LAB — MAGNESIUM: Magnesium: 1.4 mg/dL — ABNORMAL LOW (ref 1.7–2.4)

## 2018-12-22 MED ORDER — PRO-STAT SUGAR FREE PO LIQD
30.0000 mL | Freq: Three times a day (TID) | ORAL | Status: DC
  Administered 2018-12-23 – 2018-12-26 (×5): 30 mL via ORAL
  Filled 2018-12-22 (×13): qty 30

## 2018-12-22 NOTE — Consult Note (Signed)
[facility] Nurse wound follow up  Wound type: healing surgical midline abdominal incision with EC fistula at the inferior border Measurement: pouch intact from bedside nursing staff 12/21/18 (date on pouch) Wound bed: 100% pink, clean visualized through pouch  Drainage (amount, consistency, odor)liquid brown Periwound: not assessed today Dressing procedure/placement/frequency: small Eakin in place; intact. Can hook to BSD if output volumes increase; at the bedside 2 small Verlan Friends [phone]  [facility] Nurse team will follow with you and see patient within 10 days for assistance with fistula.  Please notify [facility] nurses of any acute changesor concerns [place] MSN, [place], [place], [place]

## 2018-12-22 NOTE — Progress Notes (Signed)
PROGRESS NOTE  [name] WVP:[id number] DOB: 06/05/1940 DOA: 12/10/2018 PCP: [name], MD  Brief History   [name]  is a 78 y.o. female, with history of anemia of chronic disease, ASCVD, AICD placement, history of breast cancer, chronic bronchitis, stage III CKD, systolic heart failure, hyperlipidemia, GERD, gout, hypertension, rheumatoid arthritis who recently had expiratory laparotomy with lysis of adhesions, bowel resection for ischemic bowel in May 2020.  She had large abdominal wound, which is healing by secondary intention.  As per family they noted feces coming from the wound.  No abdominal pain or fever.  Patient was brought to the hospital for further evaluation.  Patient is a poor historian.  History obtained from ED records.   CT of the abdomen pelvis was done, no results are available in epic due to technical issues.  ED physician spoke to radiologist verbally about the results.     As per ED physician, there is an enterocutaneous fistula and inflammation around the gallbladder.  No acute cholecystitis.  General surgery, Dr. Rosendo Gros was consulted, he recommended no immediate surgery at this time.  Recommended patient to transfer to [facility] and surgery have been consulted again and have seen patient this morning. The patient now has an Eakins pouch, wound care, and TPN. Cental venous catheter has been placed.  Lynann Beaver pouch has been placed and has had low output. If she continues to have low output, and can be weaned off of TPN to an oral diet, the patient may be discharged soon. However, currently she is only ingesting about 15% of her meals. Calorie count in place.  Surgery has stopped TPN as of 12/18/2018. Patient has been hypoglycemic. D5 NS at 75 cc/hr has been started. This morning her hemoglobin is down to 6.4. She is being transfused with 2 units of PRBC's. Palliative care has been consulted.  She has been evaluated by SLP. She has been placed on a dysphagia 2 diet  with fine chopped meats and this liquids by cup or straw. Staff to assist.  Severe Protein Calorie Malnutrition: I appreciate nutrition's assistance. She is only taking in 50% of required caloric intake.   Palliative care is involved and has discussed the patient with her husband who is considering issues considering future care of the patient including facility placement or hospice care and recurrent hospitalizations. It is a certainty that the patient will have recurrent hospitalizations as her oral intake is not sufficient to support her.  12/22/2018: Patient seen.  No new changes.  Palliative care and PT is highly appreciated.  Consultants  . General Surgery - signed off . Wound care . Nutrition . Palliative Care  Procedures  . PICC line placement.  Antibiotics   Anti-infectives (From admission, onward)   Start     Dose/Rate Route Frequency Ordered Stop   12/11/18 1000  piperacillin-tazobactam (ZOSYN) IVPB 3.375 g  Status:  Discontinued     3.375 g 12.5 mL/hr over 240 Minutes Intravenous Every 12 hours 12/10/18 2220 12/11/18 0522   12/11/18 1000  piperacillin-tazobactam (ZOSYN) IVPB 3.375 g  Status:  Discontinued     3.375 g 12.5 mL/hr over 240 Minutes Intravenous Every 12 hours 12/11/18 0524 12/12/18 1122   12/11/18 0800  piperacillin-tazobactam (ZOSYN) IVPB 2.25 g  Status:  Discontinued     2.25 g 100 mL/hr over 30 Minutes Intravenous Every 6 hours 12/11/18 0522 12/11/18 0524   12/10/18 2215  piperacillin-tazobactam (ZOSYN) IVPB 3.375 g     3.375 g 100 mL/hr  over 30 Minutes Intravenous  Once 12/10/18 2211 12/10/18 2256      Subjective  No complaints.   No fever or chills.   No nausea or vomiting.   No abdominal pain.    Objective   Vitals:  Vitals:   12/22/18 0410 12/22/18 1520  BP: 121/68 129/79  Pulse: 70 70  Resp: 16 18  Temp: 98.4 F (36.9 C) 98.3 F (36.8 C)  SpO2: 96% 99%    Exam:  Constitutional:  . The patient is awake and alert.  Patient is not  in any distress.  Patient is a poor historian. Respiratory:  Clear to auscultation.   Cardiovascular:  . S1-S2. Abdomen:  . Abdomen is obese, soft and nontender.   Lynann Beaver pouch in place. Neurologic:  . Awake and alert.   Patient moves all extremities.    Today's Data  . CMP, CBC, Vitals  Micro Data  . Nares MSSA and MRSA positive.  Imaging  . CT abdomen: Entero-cutaneous fistula.  Scheduled Meds: . amiodarone  200 mg Oral Daily  . carvedilol  3.125 mg Oral BID WC  . Chlorhexidine Gluconate Cloth  6 each Topical Daily  . feeding supplement (PRO-STAT SUGAR FREE 64)  30 mL Oral TID BM  . heparin  5,000 Units Subcutaneous Q8H  . insulin aspart  0-9 Units Subcutaneous Q4H  . mirtazapine  15 mg Oral QPM  . multivitamin with minerals  1 tablet Oral Daily  . pantoprazole  40 mg Oral Daily  . saccharomyces boulardii  250 mg Oral BID   Continuous Infusions: . sodium chloride 10 mL/hr at 12/16/18 0600  . dextrose 100 mL/hr at 12/22/18 1533    Active Problems:   AKI (acute kidney injury) ([place])   Unstageable pressure ulcer of sacral region [facility])   Enterocutaneous fistula   Palliative care encounter   LOS: 12 days   A & P  Acute kidney injury-patient presented with acute kidney injury with creatinine 2.94, her baseline creatinine as of November 12, 2018 was 1.25.  Started on 0.45% normal saline at 100 mL/h as patient also had hypernatremia with sodium 156. Sodium today is 139. Creatinine is 1.32 today. Follow creatinine, electrolytes, and volume status carefully. 12/21/2018: Renal function is back to baseline.  Continue to monitor renal function and electrolytes.   Hypernatremia: Resolved   Anemia:  Last hemoglobin check was 10.6 g/dL.   Fecal occult blood is negative.  Hypokalemia:  Resolved.  Monitor and supplement as necessary. Supplement.   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  No immediate  surgical intervention required.  General surgery consulted and Eakins pouch in place. They have recommended CT again later this week. Lynann Beaver pouch has been placed and has had low output. If she continues to have low output, the patient may be allowed to eat, per surgery, and discharged soon. I am unable to determine if this fisula is a complication of her surgery or not. Surgery states that the patient is having low output from fistula. They recommend weaning her off of TPN to oral diet. She may then be discharged. However, the patient is currently only taking in 50% of her nutitional caloric requirements. 12/22/2018: Continue with calorie count.  Unstageable ulcer of the sacrum and Stage II pressure ulcer on buttock: Present on admission. Wound care consulted.   Cholelithiasis without cholecystitis:  Continue to manage expectantly.   Ultrasound abdomen result reviewed. CT abdomen and pelvis result  reviewed  History of wide-complex tachycardia/CAD-continue amiodarone, Coreg.   Diabetes mellitus/hypoglycemia:  IVF D5W at 100 cc an hour.   Monitor blood sugar closely.    DVT prophylaxis: Heparin Code Status: DNR Family Communication:  Disposition Plan: tbd. Palliative care has been consulted. Hospice vs SNF. Husband is considering.  Dana Allan, MD   [facility] Direct contact: see [email or web address]  7PM-7AM contact night coverage as above

## 2018-12-22 NOTE — Progress Notes (Signed)
Calorie Count Note  48 hour calorie count ordered.  Diet: dysphagia 2 diet with thin liquids Supplements: Ensure Enlive po TID, each supplement provides 350 kcal and 20 grams of protein; Magic cup TID with meals, each supplement provides 290 kcal and 9 grams of protein; Hormel shake TID with meals, each supplement provides 520 kcals and 22 grams protein; MVI with minerals daily  8/21- CWOCN placed eakin pouch on EC fistula; midline catheter placed;tunnelled CVC placed;TPN initiated 8/25- s/p BSE- advanced to dysphagia 1 diet with nectar thick liquids 8/27- advanced to dysphagia 2 diet with thin liquids, TPN d/c  Pt's intake has declined (PO: 0-25% of meals). She is now refusing Ensure supplements. Palliative care following for goals of care. Pt husband currently uncertain about transitioning to hospice/ comfort measures.   8/27 Breakfast: 193 kcals, 10 grams protein Lunch: refused Dinner: refused Supplements: 2 Ensure supplements (700 kcals, 40 grams protein)  Total intake: 893 kcal (50% of minimum estimated needs)  50 grams protein (56% of minimum estimated needs)  8/28 Breakfast: 155 kcals, 9 grams protein Supplements: 1 Ensure supplement Lunch: 0% meal completion  Dinner: 0% meal completion  Total intake: 505 kcal (28% of minimum estimated needs)  29 grams protein (32% of minimum estimated needs)  Average Total intake: 699 kcal (39% of minimum estimated needs)  40 grams protein (44% of minimum estimated needs)  Nutrition Dx: Inadequate oral intakerelated to altered GI functionas evidenced by NPO status; progressing- advanced to dysphagia 2 diet with thin liquids  Goal: Patient will meet greater than or equal to 90% of their needs; unmet Intervention:  -D/c calorie count -Continue MVI with minerals daily -30 ml Prostat TID, each supplement provides 100 kcals and 15 grams protein -D/c Ensure Enlive po TID, each supplement provides 350 kcal and 20 grams of  protein -Magic cup TID with meals, each supplement provides 290 kcal and 9 grams of protein -Hormel shake TID with meals, each supplement provides 520 kcals and 22 grams protein -Feeding assistance with meals -Consider initiation of nutrition support if within goals of care  Marian Grandt A. Jimmye Norman, RD, LDN, [place] Registered Dietitian II Certified Diabetes Care and Education Specialist Pager: [phone] After hours Pager: [phone]

## 2018-12-22 NOTE — Patient Outreach (Signed)
[facility]) [place]  12/22/2018  VALARI TAYLOR 1940/10/12 [id number]  Reason for referral: medication assistance with Lovenox  Communication received from [facility] hospital liaison that patient currently hospitalized > 10 days, discharge plans being discussed with spouse, consideration for SNF vs home with Hospice or palliative are.    [facility] pharmacy case is being closed due to the following reasons: hospitalization > 10 days.  New referral will be placed if appropriate upon discharge by [facility] hospital liaison.   Ralene Bathe, PharmD, [place] [phone]

## 2018-12-22 NOTE — Progress Notes (Signed)
Palliative:   I attempted to call [name]'s spouse and received no answer and the voicemail was full.   Will try again to reach tomorrow.   Mariana Kaufman, AGNP-C Palliative Medicine  Please call Palliative Medicine team phone with any questions [phone]. For individual providers please see AMION.  No charge

## 2018-12-23 DIAGNOSIS — Z7189 Other specified counseling: Secondary | ICD-10-CM

## 2018-12-23 DIAGNOSIS — E46 Unspecified protein-calorie malnutrition: Secondary | ICD-10-CM

## 2018-12-23 LAB — GLUCOSE, CAPILLARY
Glucose-Capillary: 141 mg/dL — ABNORMAL HIGH (ref 70–99)
Glucose-Capillary: 64 mg/dL — ABNORMAL LOW (ref 70–99)
Glucose-Capillary: 67 mg/dL — ABNORMAL LOW (ref 70–99)
Glucose-Capillary: 75 mg/dL (ref 70–99)
Glucose-Capillary: 81 mg/dL (ref 70–99)
Glucose-Capillary: 85 mg/dL (ref 70–99)
Glucose-Capillary: 92 mg/dL (ref 70–99)

## 2018-12-23 MED ORDER — MAGNESIUM SULFATE 2 GM/50ML IV SOLN
2.0000 g | Freq: Once | INTRAVENOUS | Status: AC
  Administered 2018-12-23: 2 g via INTRAVENOUS
  Filled 2018-12-23: qty 50

## 2018-12-23 NOTE — Progress Notes (Signed)
PROGRESS NOTE  [name]:[id number] DOB: 10/31/1940 DOA: 12/10/2018 PCP: [name], MD  Brief History   [name]  is a 78 y.o. female, with history of anemia of chronic disease, ASCVD, AICD placement, history of breast cancer, chronic bronchitis, stage III CKD, systolic heart failure, hyperlipidemia, GERD, gout, hypertension, rheumatoid arthritis who recently had expiratory laparotomy with lysis of adhesions, bowel resection for ischemic bowel in May 2020.  She had large abdominal wound, which is healing by secondary intention.  As per family they noted feces coming from the wound.  No abdominal pain or fever.  Patient was brought to the hospital for further evaluation.  Patient is a poor historian.  History obtained from ED records.   CT of the abdomen pelvis was done, no results are available in epic due to technical issues.  ED physician spoke to radiologist verbally about the results.     As per ED physician, there is an enterocutaneous fistula and inflammation around the gallbladder.  No acute cholecystitis.  General surgery, Dr. Rosendo Gros was consulted, he recommended no immediate surgery at this time.  Recommended patient to transfer to [facility] and surgery have been consulted again and have seen patient this morning. The patient now has an Eakins Holden, wound care, and TPN. Cental venous catheter has been placed.  [name] has been placed and has had low output. If she continues to have low output, and can be weaned off of TPN to an oral diet, the patient may be discharged soon. However, currently she is only ingesting about 15% of her meals. Calorie count in place.  Surgery has stopped TPN as of 12/18/2018. Patient has been hypoglycemic. D5 NS at 75 cc/hr has been started. This morning her hemoglobin is down to 6.4. She is being transfused with 2 units of PRBC's. Palliative care has been consulted.  She has been evaluated by SLP. She has been placed on a dysphagia 2 diet  with fine chopped meats and this liquids by cup or straw. Staff to assist.  Hypoglycemia: Continueing issue. Rate of D51/2 NS has again been increased to 125 cc/hr. Monitor.  Severe Protein Calorie Malnutrition: I appreciate nutrition's assistance. She is only taking in 50% of required caloric intake.   Palliative care is involved and has discussed the patient with her husband who is considering issues considering future care of the patient including facility placement or hospice care and recurrent hospitalizations. It is a certainty that the patient will have recurrent hospitalizations as her oral intake is not sufficient to support her. They are continuing to work with him on making plans for the patient. PT has beenconsulted to evaluate the patient.  Consultants  . General Surgery - signed off . Wound care . Nutrition . Palliative Care  Procedures  . PICC line placement.  Antibiotics   Anti-infectives (From admission, onward)   Start     Dose/Rate Route Frequency Ordered Stop   12/11/18 1000  piperacillin-tazobactam (ZOSYN) IVPB 3.375 g  Status:  Discontinued     3.375 g 12.5 mL/hr over 240 Minutes Intravenous Every 12 hours 12/10/18 2220 12/11/18 0522   12/11/18 1000  piperacillin-tazobactam (ZOSYN) IVPB 3.375 g  Status:  Discontinued     3.375 g 12.5 mL/hr over 240 Minutes Intravenous Every 12 hours 12/11/18 0524 12/12/18 1122   12/11/18 0800  piperacillin-tazobactam (ZOSYN) IVPB 2.25 g  Status:  Discontinued     2.25 g 100 mL/hr over 30 Minutes Intravenous Every 6 hours 12/11/18 0522  12/11/18 0524   12/10/18 2215  piperacillin-tazobactam (ZOSYN) IVPB 3.375 g     3.375 g 100 mL/hr over 30 Minutes Intravenous  Once 12/10/18 2211 12/10/18 2256      Subjective  The patient is resting comfortably this morning. No new complaints.  Objective   Vitals:  Vitals:   12/23/18 0425 12/23/18 1427  BP: 132/66 127/78  Pulse: 77 76  Resp: 18 18  Temp: 98.4 F (36.9 C) 98.8 F  (37.1 C)  SpO2: 100% 99%    Exam:  Constitutional:  . The patient is somnolent Nonverbal. No acute distress.  Respiratory:  . No increased work of breathing. . No wheezes, rales, or rhonchi. . No tactile fremitus. Cardiovascular:  . Regular rate and rhythm. . No murmurs, ectopy, or gallups. . No lateral PMI. No thrills. Abdomen:  . Abdomen is soft, non-tender, non-distended. . No hernias, masses, or organomegaly. . Hypoactive bowel sounds . Eakins Holden in place. Musculoskeletal:  . No cyanosis, clubbing, or edema Skin:  . No rashes, lesions, ulcers . palpation of skin: no induration or nodules Neurologic:  . CN 2-12 intact . Sensation all 4 extremities intact Psychiatric:  . Unable to evaluate as the patient is unable to cooperate with exam.  I have personally reviewed the following:   Today's Data  . CMP, CBC, Vitals  Micro Data  . Nares MSSA and MRSA positive.  Imaging  . CT abdomen: Entero-cutaneous fistula.  Scheduled Meds: . amiodarone  200 mg Oral Daily  . carvedilol  3.125 mg Oral BID WC  . Chlorhexidine Gluconate Cloth  6 each Topical Daily  . feeding supplement (PRO-STAT SUGAR FREE 64)  30 mL Oral TID BM  . heparin  5,000 Units Subcutaneous Q8H  . insulin aspart  0-9 Units Subcutaneous Q4H  . mirtazapine  15 mg Oral QPM  . multivitamin with minerals  1 tablet Oral Daily  . pantoprazole  40 mg Oral Daily  . saccharomyces boulardii  250 mg Oral BID   Continuous Infusions: . sodium chloride 10 mL/hr at 12/16/18 0600  . dextrose 100 mL/hr at 12/22/18 2300    Active Problems:   AKI (acute kidney injury) ([place])   Unstageable pressure ulcer of sacral region [name])   Enterocutaneous fistula   Palliative care encounter   Protein-calorie malnutrition ([place])   Advanced care planning/counseling discussion   LOS: 13 days   A & P  Acute kidney injury-patient presented with acute kidney injury with creatinine 2.94, her baseline creatinine as of November 12, 2018 was 1.25.  Started on 0.45% normal saline at 100 mL/h as patient also had hypernatremia with sodium 156. Sodium today is 139. Creatinine is 1.32 today. Follow creatinine, electrolytes, and volume status carefully.   Hypernatremia: Resolved with TPN. Sodium is 133 today.   Anemia: Hemoglobin is 6.4 this morning. The patient will be transfused with 2 units of PRBC's. FOBT has been ordered.  Loose BM's: One yesterday. Loose BM's expected due to patient's very minimal intake of solid food.  Hypokalemia: Resolved. Monitor and supplement as necessary. Supplement.   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  No immediate surgical intervention required.  General surgery consulted and Eakins Holden in place. They have recommended CT again later this week. [name] has been placed and has had low output. If she continues to have low output, the patient may be allowed to eat, per surgery, and discharged soon. I am unable  to determine if this fisula is a complication of her surgery or not. Surgery states that the patient is having low output from fistula. They recommend weaning her off of TPN to oral diet. She may then be discharged. However, the patient is currently only taking in 50% of her nutitional caloric requirements.  Unstageable ulcer of the sacrum and Stage II pressure ulcer on buttock: Present on admission. Wound care consulted.   Cholecystitis-CT scan showed inflammation around gallbladder.  Will obtain abdominal ultrasound in a.m. No surgical intervention is planned.  Zosyn has been discontinued.   History of wide-complex tachycardia/CAD-continue amiodarone, Coreg.   Diabetes mellitus/hypoglycemia: IV D5 with IV 1//2 NS at 125 cc an hour.. Monitor. HbA1c 4.7.  I have seen and examined this patient myself. I have spent 38 minutes in her evaluation and care.  DVT prophylaxis: Heparin Code Status: DNR Family  Communication: No family present. I attempted to contact the patient's husband this morning about he drop in hemoglobin. There was no answer. Disposition Plan: tbd. Palliative care has been consulted. Hospice vs SNF. Husband is considering.  Savannah Laursen, DO [facility] Direct contact: see [email or web address]  7PM-7AM contact night coverage as above 12/23/2018, 7:57 PM  LOS: 1 day

## 2018-12-23 NOTE — Progress Notes (Signed)
  Speech Language Pathology Treatment: Dysphagia  Patient Details Name: [name] MRN: [id number] DOB: 01/18/1941 Today's Date: 12/23/2018 Time: 9833-8250 SLP Time Calculation (min) (ACUTE ONLY): 21 min  Assessment / Plan / Recommendation Clinical Impression  Pt tolerated simulated ground texture, thin liquid by straw, and regular solid graham cracker with no clinical s/s of aspiration.  Pt was eager to consume graham cracker.  Pt independently took small bites.  Prolonged oral phase present with pt stating she would take her time to make cracker soft while eating because she knows she shouldn't eat scratchy or hard things.  Pt consumed entire package (3 crackers).  Pt stated she did not want magic cup or vital shake supplements that were in her room.  She said they made her stomach hurt and she had to stop eating them.  Recommend continuing ground diet with thin liquids for ease of consumption (especially as pt has expectorated solids previously), but diet may be advanced if desired as pharyngeal swallow was "[facility] across solid and liquid PO trials" per MBSS on 8/27.  Consider liberalizing diet to encourage PO intake, especially if palliative care is pursued.    SLP will sign off at this time as pt is tolerating oral intake without overt s/s of aspiration.     HPI HPI: Pt is a 78 y.o. female, with history of anemia of chronic disease, ASCVD, AICD placement, history of breast cancer, chronic bronchitis, stage III CKD, systolic heart failure, hyperlipidemia, GERD, gout, hypertension, rheumatoid arthritis who recently had expiratory laparotomy with lysis of adhesions, bowel resection for ischemic bowel in May 2020.  She had large abdominal wound, which is healing by secondary intention.  As per family they noted feces coming from the wound; s/p eakin pouch 8/21. Pt was followed by SLP during recent admission with recommedation for pureed diet, thin liquids, but pt had very little intake.      SLP  Plan  All goals met       Recommendations  Diet recommendations: Dysphagia 2 (fine chop);Thin liquid Medication Administration: Crushed with puree Supervision: Staff to assist with self feeding Compensations: Minimize environmental distractions;Slow rate;Small sips/bites Postural Changes and/or Swallow Maneuvers: Upright 30-60 min after meal;Seated upright 90 degrees                Oral Care Recommendations: Oral care BID Follow up Recommendations: 24 hour supervision/assistance SLP Visit Diagnosis: Dysphagia, oropharyngeal phase (R13.12) Plan: All goals met       [place], MA, [place] Office: [phone] 12/23/2018, 11:11 AM

## 2018-12-23 NOTE — Progress Notes (Signed)
Daily Progress Note   Patient Name: [name]       Date: 12/23/2018 DOB: [birth year]  Age: 78 y.o. MRN#: [id number] Attending Physician: Karie Kirks, DO Primary Care Physician: [name], MD Admit Date: 12/10/2018  Reason for Consultation/Follow-up: Establishing goals of care  Subjective: Patient in bed, smiling. Per tech she ate more of her lunch than she has of any other meal today. She has also stated that milk gives her diarrhea. She does not complain of pain.  I again attempted to reach her spouse without success. I was able to reach her nephewHerbie Baltimore. [name] tells me that [name] works second shift and leaves home at 20 and doesn't return home until 1am. He notes that if I call tomorrow before 1200 I should be able to reach him   Length of Stay: 13  Current Medications: Scheduled Meds:  . amiodarone  200 mg Oral Daily  . carvedilol  3.125 mg Oral BID WC  . Chlorhexidine Gluconate Cloth  6 each Topical Daily  . feeding supplement (PRO-STAT SUGAR FREE 64)  30 mL Oral TID BM  . heparin  5,000 Units Subcutaneous Q8H  . insulin aspart  0-9 Units Subcutaneous Q4H  . mirtazapine  15 mg Oral QPM  . multivitamin with minerals  1 tablet Oral Daily  . pantoprazole  40 mg Oral Daily  . saccharomyces boulardii  250 mg Oral BID    Continuous Infusions: . sodium chloride 10 mL/hr at 12/16/18 0600  . dextrose 100 mL/hr at 12/22/18 2300    PRN Meds: sodium chloride, acetaminophen, albuterol, food thickener, lidocaine, ondansetron **OR** ondansetron (ZOFRAN) IV, oxyCODONE, Resource ThickenUp Clear, sodium chloride flush  Physical Exam Vitals signs and nursing note reviewed.  Skin:    Coloration: Skin is pale.  Neurological:     Mental Status: She is alert.     Comments: Very  hard of hearing             Vital Signs: BP 127/78 (BP Location: Right Wrist)   Pulse 76   Temp 98.8 F (37.1 C) (Oral)   Resp 18   Ht 5\' 3"  (1.6 m)   Wt 67.1 kg   SpO2 99%   BMI 26.22 kg/m  SpO2: SpO2: 99 % O2 Device: O2 Device: Room Air O2 Flow Rate:  Intake/output summary:   Intake/Output Summary (Last 24 hours) at 12/23/2018 1752 Last data filed at 12/23/2018 1427 Gross per 24 hour  Intake 480 ml  Output 2200 ml  Net -1720 ml   LBM: Last BM Date: 12/22/18 Baseline Weight: Weight: 67.1 kg Most recent weight: Weight: 67.1 kg       Palliative Assessment/Data: PPS: 20%     Patient Active Problem List   Diagnosis Date Noted  . Enterocutaneous fistula   . Palliative care encounter   . Unstageable pressure ulcer of sacral region ([place]) 12/11/2018  . Goals of care, counseling/discussion   . Palliative care by specialist   . Acute respiratory failure with hypoxia ([place])   . Protein-calorie malnutrition, severe 08/25/2018  . Wide-complex tachycardia ([place])   . Acute renal failure superimposed on stage 3 chronic kidney disease ([place]) 08/18/2018  . SBO (small bowel obstruction) ([place]) 08/14/2018  . Pressure injury of skin 08/14/2018  . CAP (community acquired pneumonia) 08/13/2018  . AKI (acute kidney injury) ([place]) 08/10/2017  . Hyperkalemia 08/10/2017  . Acute blood loss anemia 09/20/2016  . Ischemic cardiomyopathy 09/20/2016  . Chronic combined systolic and diastolic CHF (congestive heart failure) ([place]) 09/20/2016  . CKD (chronic kidney disease), stage IV ([place]) 09/20/2016  . History of breast cancer 08/02/2016  . Primary osteoarthritis of both knees 08/02/2016  . Primary osteoarthritis of both feet 08/02/2016  . History of anemia 07/17/2016  . History of CHF (congestive heart failure) 06/04/2016  . History of chronic kidney disease 06/04/2016  . Symptomatic anemia 06/04/2016  . Elevated sedimentation rate 03/03/2016  . Idiopathic chronic gout of multiple sites  without tophus 03/03/2016  . Total knee replacement status, right 03/03/2016  . High risk medication use 03/03/2016  . Chronic kidney disease (CKD) stage G3b/A1, moderately decreased glomerular filtration rate (GFR) between 30-44 mL/min/1.73 square meter and albuminuria creatinine ratio less than 30 mg/g (HCC) 02/01/2016  . CAD S/P remote PCI- no details 09/02/2014  . Cardiomyopathy, ischemic-EF 30-35% March 2015 09/02/2014  . Diastolic dysfunction-grade 2 09/02/2014  . CHF exacerbation ([place]) 08/28/2014  . Acute on chronic combined systolic and diastolic congestive heart failure ([place]) 08/28/2014  . Iron deficiency anemia 08/20/2013  . Malnutrition of moderate degree ([place]) 07/21/2013  . PNA (pneumonia) 07/19/2013  . HCAP (healthcare-associated pneumonia) 07/17/2013  . Sepsis ([place]) 07/17/2013  . ARF (acute renal failure) ([place]) 07/17/2013  . Rheumatoid arthritis ([place]) 07/04/2013  . Hypokalemia 07/03/2013  . Chest pain 07/03/2013  . Biventricular ICD in place (MDT 2014) 11/06/2012  . Anemia of chronic disease   . Breast cancer ([place])   . Hypertension   . Dyslipidemia 05/24/2009  . Chronic systolic heart failure ([place]) 05/24/2009  . Primary osteoarthritis of both hands 08/11/2007    Palliative Care Assessment & Plan   Patient Profile: Palliative Care consult requested for this 78 y.o. female with multiple medical problems including CAD, systolic heart failure status post AICD placement, CKD 3, chronic bronchitis, RA, who recently had an exploratory laparotomy with lysis of adhesions and bowel resection for ischemic bowel in May 2020.  She had a large open abdominal wound healing by secondary intention.  Patient was readmitted on 12/10/2018 with report of fecal material draining from wound.  Patient was found to have a fistula.  An Eakin's pouch was placed by surgery.  Patient was initially on TPN but that was eventually discontinued.  Oral intake is been markedly poor and patient has been  persistently hyperglycemic.  Palliative care was consulted to  help address goals.   Assessment/Recommendations/Plan   Progressing to solid diet but per nutrition eval not meeting calorie goals  I do not see that she has been evaluated by PT- will request PT eval  I will attempt to reach her spouse tomorrow again for followup on [place]- for now recommend continue current care  Goals of Care and Additional Recommendations:  Limitations on Scope of Treatment: Full Scope Treatment  Code Status:  DNR  Prognosis:   Unable to determine  Discharge Planning:  To Be Determined  Care plan was discussed with patient's nephew [name].  Thank you for allowing the Palliative Medicine Team to assist in the care of this patient.   Time In: 1715 Time Out: 1800 Total Time 45 mins Prolonged Time Billed no      Greater than 50%  of this time was spent counseling and coordinating care related to the above assessment and plan.  Mariana Kaufman, AGNP-C Palliative Medicine   Please contact Palliative Medicine Team phone at [phone] for questions and concerns.

## 2018-12-24 LAB — CBC WITH DIFFERENTIAL/PLATELET
Abs Immature Granulocytes: 0.01 10*3/uL (ref 0.00–0.07)
Basophils Absolute: 0 10*3/uL (ref 0.0–0.1)
Basophils Relative: 0 %
Eosinophils Absolute: 0.1 10*3/uL (ref 0.0–0.5)
Eosinophils Relative: 3 %
HCT: 30 % — ABNORMAL LOW (ref 36.0–46.0)
Hemoglobin: 9.4 g/dL — ABNORMAL LOW (ref 12.0–15.0)
Immature Granulocytes: 0 %
Lymphocytes Relative: 32 %
Lymphs Abs: 1.5 10*3/uL (ref 0.7–4.0)
MCH: 27.7 pg (ref 26.0–34.0)
MCHC: 31.3 g/dL (ref 30.0–36.0)
MCV: 88.5 fL (ref 80.0–100.0)
Monocytes Absolute: 0.7 10*3/uL (ref 0.1–1.0)
Monocytes Relative: 14 %
Neutro Abs: 2.3 10*3/uL (ref 1.7–7.7)
Neutrophils Relative %: 51 %
Platelets: 176 10*3/uL (ref 150–400)
RBC: 3.39 MIL/uL — ABNORMAL LOW (ref 3.87–5.11)
RDW: 19.2 % — ABNORMAL HIGH (ref 11.5–15.5)
WBC: 4.6 10*3/uL (ref 4.0–10.5)
nRBC: 0 % (ref 0.0–0.2)

## 2018-12-24 LAB — GLUCOSE, CAPILLARY
Glucose-Capillary: 109 mg/dL — ABNORMAL HIGH (ref 70–99)
Glucose-Capillary: 110 mg/dL — ABNORMAL HIGH (ref 70–99)
Glucose-Capillary: 64 mg/dL — ABNORMAL LOW (ref 70–99)
Glucose-Capillary: 77 mg/dL (ref 70–99)
Glucose-Capillary: 79 mg/dL (ref 70–99)
Glucose-Capillary: 90 mg/dL (ref 70–99)
Glucose-Capillary: 96 mg/dL (ref 70–99)

## 2018-12-24 LAB — BASIC METABOLIC PANEL
Anion gap: 8 (ref 5–15)
BUN: 11 mg/dL (ref 8–23)
CO2: 23 mmol/L (ref 22–32)
Calcium: 7.9 mg/dL — ABNORMAL LOW (ref 8.9–10.3)
Chloride: 101 mmol/L (ref 98–111)
Creatinine, Ser: 1.1 mg/dL — ABNORMAL HIGH (ref 0.44–1.00)
GFR calc Af Amer: 55 mL/min — ABNORMAL LOW (ref >60)
GFR calc non Af Amer: 47 mL/min — ABNORMAL LOW (ref >60)
Glucose, Bld: 85 mg/dL (ref 70–99)
Potassium: 4.2 mmol/L (ref 3.5–5.1)
Sodium: 132 mmol/L — ABNORMAL LOW (ref 135–145)

## 2018-12-24 MED ORDER — BOOST / RESOURCE BREEZE PO LIQD CUSTOM
1.0000 | Freq: Three times a day (TID) | ORAL | Status: DC
  Administered 2018-12-24 – 2018-12-28 (×8): 1 via ORAL

## 2018-12-24 NOTE — Progress Notes (Signed)
Inpatient Diabetes Program Recommendations  AACE/ADA: New Consensus Statement on Inpatient Glycemic Control (2015)  Target Ranges:  Prepandial:   less than 140 mg/dL      Peak postprandial:   less than 180 mg/dL (1-2 hours)      Critically ill patients:  140 - 180 mg/dL   Lab Results  Component Value Date   GLUCAP 77 12/24/2018   HGBA1C 4.7 (L) 12/11/2018    Review of Glycemic Control Results for REMIE, MATHISON (MRN [id number]) as of 12/24/2018 09:50  Ref. Range 12/23/2018 20:46 12/24/2018 00:28 12/24/2018 04:04 12/24/2018 07:55 12/24/2018 08:33  Glucose-Capillary Latest Ref Range: 70 - 99 mg/dL 81 90 79 64 (L) 77   Diabetes history: DM - A1C=4.7% Outpatient Diabetes medications:  Current orders for Inpatient glycemic control:  Novolog sensitive tid with meals Inpatient Diabetes Program Recommendations:    Please d/c Novolog correction- patient does not seem to need.   Thanks  Adah Perl, RN, BC-ADM Inpatient Diabetes Coordinator Pager [phone] (8a-5p)

## 2018-12-24 NOTE — Evaluation (Signed)
Physical Therapy Evaluation Patient Details Name: [name] MRN: [id number] DOB: 1940/11/27 Today's Date: 12/24/2018   History of Present Illness  Pt is an 78 y.o. female admitted 12/10/18 with large abdominal wound (recent admission 08/2018 for exlap with bowel resection), family noted feces coming from wound. Imaging showed enterocutaneous fistula; Eakins pouch placed. Pt also with unstageable sacral ulcer. PMH includes AICD, breast CA, chronic bronchitis, CKD III, gout, RA, HTN.    Clinical Impression  Pt presents with an overall decrease in functional mobility secondary to above. PTA, pt reports living at home with family assist, pt states she does not walk and family helps her to w/c; per chart review, pt was mod indep with RW PTA 4 months ago. Today, pt required maxA to sit EOB; unable to achieve standing despite maxA, pt fearful of falling and declining transfer to recliner. Recommend use of lift equipment for OOB with nursing staff. Recommend SNF-level therapies to maximize functional mobility and decrease caregiver burden. Per chart, family deciding between SNF and home with hospice services.     Follow Up Recommendations SNF;Supervision/Assistance - 24 hour(vs. home with hospice)    Equipment Recommendations  Hospital bed    Recommendations for Other Services       Precautions / Restrictions Precautions Precautions: Fall Precaution Comments: Eakins pouch, PICC Restrictions Weight Bearing Restrictions: No      Mobility  Bed Mobility Overal bed mobility: Needs Assistance Bed Mobility: Supine to Sit;Sit to Supine     Supine to sit: Max assist;HOB elevated Sit to supine: Max assist   General bed mobility comments: Pt able to initiate movement to EOB with use of bed rail, maxA for trunk elevation and scooting hips to EOB. MaxA for return to supine and repositioning  Transfers Overall transfer level: Needs assistance               General transfer comment:  Attempted standing from EOB, pt unable to achieve fully upright despite maxA; clearly fearful of falling and declining further attempts for transfer to recliner  Ambulation/Gait                Stairs            Wheelchair Mobility    Modified Rankin (Stroke Patients Only)       Balance Overall balance assessment: Needs assistance   Sitting balance-Leahy Scale: Fair       Standing balance-Leahy Scale: Zero                               Pertinent Vitals/Pain Pain Assessment: Faces Faces Pain Scale: Hurts a little bit Pain Location: Stomach Pain Descriptors / Indicators: Grimacing Pain Intervention(s): Monitored during session    Home Living Family/patient expects to be discharged to:: Private residence Living Arrangements: Spouse/significant other Available Help at Discharge: Family;Available PRN/intermittently Type of Home: Mobile home Home Access: Stairs to enter Entrance Stairs-Rails: Right;Left;Can reach both Entrance Stairs-Number of Steps: 4 Home Layout: One level Home Equipment: Walker - 2 wheels;Shower seat;Hand held shower head;Toilet riser;Wheelchair - manual Additional Comments: Home setup taken from PT note 4 months ago who contacted husband for details. Husband works Biochemist, clinical, daughter assists    Prior Function Level of Independence: Needs assistance   Gait / Transfers Assistance Needed: Prior to admission 4 months ago, pt was mod indep with RW. Today, pt reports she cannot walk, family assists her to/from w/c  Hand Dominance        Extremity/Trunk Assessment   Upper Extremity Assessment Upper Extremity Assessment: Generalized weakness    Lower Extremity Assessment Lower Extremity Assessment: Generalized weakness    Cervical / Trunk Assessment Cervical / Trunk Assessment: Kyphotic  Communication   Communication: HOH  Cognition Arousal/Alertness: Awake/alert Behavior During Therapy: Flat affect Overall  Cognitive Status: No family/caregiver present to determine baseline cognitive functioning Area of Impairment: Memory;Following commands;Problem solving                     Memory: Decreased short-term memory Following Commands: Follows one step commands with increased time     Problem Solving: Slow processing;Requires verbal cues General Comments: Difficult to assess due to very [facility]      General Comments      Exercises     Assessment/Plan    PT Assessment Patient needs continued PT services  PT Problem List Decreased activity tolerance;Decreased strength;Decreased balance;Decreased mobility;Decreased cognition;Decreased knowledge of use of DME       PT Treatment Interventions DME instruction;Functional mobility training;Therapeutic activities;Therapeutic exercise;Balance training;Wheelchair mobility training;Patient/family education;Gait training    PT Goals (Current goals can be found in the Care Plan section)  Acute Rehab PT Goals Patient Stated Goal: Lay back down PT Goal Formulation: With patient Time For Goal Achievement: 01/07/19 Potential to Achieve Goals: Fair    Frequency Min 2X/week   Barriers to discharge        Co-evaluation               AM-PAC PT "6 Clicks" Mobility  Outcome Measure Help needed turning from your back to your side while in a flat bed without using bedrails?: A Lot Help needed moving from lying on your back to sitting on the side of a flat bed without using bedrails?: A Lot Help needed moving to and from a bed to a chair (including a wheelchair)?: Total Help needed standing up from a chair using your arms (e.g., wheelchair or bedside chair)?: Total Help needed to walk in hospital room?: Total Help needed climbing 3-5 steps with a railing? : Total 6 Click Score: 8    End of Session   Activity Tolerance: Patient limited by fatigue Patient left: in bed;with call bell/phone within reach;with bed alarm set;Other  (comment)(bed in chair position) Nurse Communication: Mobility status;Need for lift equipment PT Visit Diagnosis: Other abnormalities of gait and mobility (R26.89);Muscle weakness (generalized) (M62.81)    Time: 4650-3546 PT Time Calculation (min) (ACUTE ONLY): 20 min   Charges:   PT Evaluation $PT Eval Moderate Complexity: [name], PT, DPT Acute Rehabilitation Services  Pager [phone] Office [phone]  [name] 12/24/2018, 11:02 AM

## 2018-12-24 NOTE — Progress Notes (Signed)
Palliative:   Spoke with patient's husband, [name], this morning. Discussed that [name] was eating a little more, but still requiring IV fluids to maintain her blood glucose. [name] stated that [name] is in her right mind and he is wanting to discuss the possibility of Hospice vs going to a nursing facility or having a feeding tube with her. I offered to meet in person with both [name] and [name], I explained that the provider attempted to discuss these issues with [facility] on Sunday and she was unable to discuss. [name] is unable to come in today. He offered to have patient's niece, [name] come in and meet, otherwise [name] will be here on Friday.  I called [name]- there was no answer.   Will leave this information and PMT member will attempt to meet with [name] when he is here on Friday. I believe that [name] is delaying decision making.   [name], AGNP-C Palliative Medicine  Please call Palliative Medicine team phone with any questions [phone]. For individual providers please see AMION.  No charge

## 2018-12-24 NOTE — Progress Notes (Signed)
Nutrition Follow-up  RD working remotely.  DOCUMENTATION CODES:   Not applicable  INTERVENTION:   -Continue MVI with minerals daily -Continue 30 ml Prostat TID, each supplement provides 100 kcals and 15 grams of protein -Boost Breeze po TID, each supplement provides 250 kcal and 9 grams of protein -D/c Magic Cup and Hormel Shake  NUTRITION DIAGNOSIS:   Increased nutrient needs related to chronic illness(EC fistula) as evidenced by estimated needs.  Ongoing  GOAL:   Patient will meet greater than or equal to 90% of their needs  Unmet  MONITOR:   PO intake, Supplement acceptance, Diet advancement, Labs, Weight trends, Skin, I & O's  REASON FOR ASSESSMENT:   Consult Calorie Count  ASSESSMENT:   [name]  is a 78 y.o. female, with history of anemia of chronic disease, ASCVD, AICD placement, history of breast cancer, chronic bronchitis, stage III CKD, systolic heart failure, hyperlipidemia, GERD, gout, hypertension, rheumatoid arthritis who recently had expiratory laparotomy with lysis of adhesions, bowel resection for ischemic bowel in May 2020.  She had large abdominal wound, which is healing by secondary intention.  As per family they noted feces coming from the wound.  No abdominal pain or fever.  Patient was brought to the hospital for further evaluation.  Patient is a poor historian.  History obtained from ED records.  8/21- CWOCN placed eakin pouch on EC fistula; midline catheter placed;tunnelled CVC placed;TPN initiated 8/25- s/p BSE- advanced to dysphagia 1 diet with nectar thick liquids 8/27- TPN d/c 8/31- calorie count completed- pt consuming less than 40% of estimated needs  Reviewed I/O's: -860 ml x 24 hours and +7.7 L since admission  UOP: 1.4 L x 24 hours  Pt's intake has improved slightly since last visit. Noted meal completion 10-50%. Pt does not like milk or milky supplements, stating they giver her diarrhea. She has also been refusing Prostat  supplements most of the time.  Palliative care team following for goals of care; planning to reach out to husband for further discussions today.   No new weight to assess since admission.   Labs reviewed: Na: 132, CBGS: 64-90(inpatient orders for glycemic control are 0-9 units insulin aspart every 4 hours)  Diet Order:   Diet Order            DIET DYS 2 Room service appropriate? Yes; Fluid consistency: Thin  Diet effective now              EDUCATION NEEDS:   Not appropriate for education at this time  Skin:  Skin Assessment: Skin Integrity Issues: Skin Integrity Issues:: Stage II, Other (Comment) Stage II: buttocks Other: open abdomen with draining fistula- eakin pouch placed  Last BM:  12/22/18  Height:   Ht Readings from Last 1 Encounters:  12/10/18 5\' 3"  (1.6 m)    Weight:   Wt Readings from Last 1 Encounters:  12/10/18 67.1 kg    Ideal Body Weight:  52.3 kg  BMI:  Body mass index is 26.22 kg/m.  Estimated Nutritional Needs:   Kcal:  1800-2000  Protein:  90-105 grams  Fluid:  > 1.8 L    [name] A. Jimmye Norman, RD, LDN, [place] Registered Dietitian II Certified Diabetes Care and Education Specialist Pager: [phone] After hours Pager: [phone]

## 2018-12-24 NOTE — Progress Notes (Signed)
PROGRESS NOTE  [name] PPI:[id number] DOB: [birth year] DOA: 12/10/2018 PCP: [name], MD  Brief History   [name]  is a 78 y.o. female, with history of anemia of chronic disease, ASCVD, AICD placement, history of breast cancer, chronic bronchitis, stage III CKD, systolic heart failure, hyperlipidemia, GERD, gout, hypertension, rheumatoid arthritis who recently had expiratory laparotomy with lysis of adhesions, bowel resection for ischemic bowel in May 2020.  She had large abdominal wound, which is healing by secondary intention.  As per family they noted feces coming from the wound.  No abdominal pain or fever.  Patient was brought to the hospital for further evaluation.  Patient is a poor historian.  History obtained from ED records.   CT of the abdomen pelvis was done, no results are available in epic due to technical issues.  ED physician spoke to radiologist verbally about the results.     As per ED physician, there is an enterocutaneous fistula and inflammation around the gallbladder.  No acute cholecystitis.  General surgery, Dr. Rosendo Gros was consulted, he recommended no immediate surgery at this time.  Recommended patient to transfer to [facility] and surgery have been consulted again and have seen patient this morning. The patient now has an [name], wound care, and TPN. Cental venous catheter has been placed.  [name] has been placed and has had low output. If she continues to have low output, and can be weaned off of TPN to an oral diet, the patient may be discharged soon. However, currently she is only ingesting about 15% of her meals. Calorie count in place.  Surgery has stopped TPN as of 12/18/2018. Patient has been hypoglycemic. D5 NS at 75 cc/hr has been started. This morning her hemoglobin is down to 6.4. She is being transfused with 2 units of PRBC's. Palliative care has been consulted.  She has been evaluated by SLP. She has been placed on a dysphagia 2 diet  with fine chopped meats and this liquids by cup or straw. Staff to assist.  Hypoglycemia: Continueing issue. Rate of D51/2 NS has again been increased to 125 cc/hr. Monitor.  Severe Protein Calorie Malnutrition: I appreciate nutrition's assistance. She is only taking in 50% of required caloric intake.   Palliative care is involved and has discussed the patient with her husband who is considering issues considering future care of the patient including facility placement or hospice care and recurrent hospitalizations. It is a certainty that the patient will have recurrent hospitalizations as her oral intake is not sufficient to support her. They are continuing to work with him on making plans for the patient. PT has beenconsulted to evaluate the patient. The patient's husband seems to be delaying decision making.  Consultants  . General Surgery - signed off . Wound care . Nutrition . Palliative Care  Procedures  . PICC line placement.  Antibiotics   Anti-infectives (From admission, onward)   Start     Dose/Rate Route Frequency Ordered Stop   12/11/18 1000  piperacillin-tazobactam (ZOSYN) IVPB 3.375 g  Status:  Discontinued     3.375 g 12.5 mL/hr over 240 Minutes Intravenous Every 12 hours 12/10/18 2220 12/11/18 0522   12/11/18 1000  piperacillin-tazobactam (ZOSYN) IVPB 3.375 g  Status:  Discontinued     3.375 g 12.5 mL/hr over 240 Minutes Intravenous Every 12 hours 12/11/18 0524 12/12/18 1122   12/11/18 0800  piperacillin-tazobactam (ZOSYN) IVPB 2.25 g  Status:  Discontinued     2.25 g 100 mL/hr  over 30 Minutes Intravenous Every 6 hours 12/11/18 0522 12/11/18 0524   12/10/18 2215  piperacillin-tazobactam (ZOSYN) IVPB 3.375 g     3.375 g 100 mL/hr over 30 Minutes Intravenous  Once 12/10/18 2211 12/10/18 2256      Subjective  The patient is resting comfortably this morning. No new complaints.  Objective   Vitals:  Vitals:   12/24/18 0400 12/24/18 1450  BP: (!) 108/56 (!)  114/52  Pulse: 70 70  Resp: 16 14  Temp: 99.5 F (37.5 C) 98.4 F (36.9 C)  SpO2: 97% 100%    Exam:  Constitutional:  . The patient is somnolent Nonverbal. No acute distress.  Respiratory:  . No increased work of breathing. . No wheezes, rales, or rhonchi. . No tactile fremitus. Cardiovascular:  . Regular rate and rhythm. . No murmurs, ectopy, or gallups. . No lateral PMI. No thrills. Abdomen:  . Abdomen is soft, non-tender, non-distended. . No hernias, masses, or organomegaly. . Hypoactive bowel sounds . [name] in place. Musculoskeletal:  . No cyanosis, clubbing, or edema Skin:  . No rashes, lesions, ulcers . palpation of skin: no induration or nodules Neurologic:  . CN 2-12 intact . Sensation all 4 extremities intact Psychiatric:  . Unable to evaluate as the patient is unable to cooperate with exam.  I have personally reviewed the following:   Today's Data  . CMP, CBC, Vitals  Micro Data  . Nares MSSA and MRSA positive.  Imaging  . CT abdomen: Entero-cutaneous fistula.  Scheduled Meds: . amiodarone  200 mg Oral Daily  . carvedilol  3.125 mg Oral BID WC  . Chlorhexidine Gluconate Cloth  6 each Topical Daily  . feeding supplement  1 Container Oral TID BM  . feeding supplement (PRO-STAT SUGAR FREE 64)  30 mL Oral TID BM  . heparin  5,000 Units Subcutaneous Q8H  . mirtazapine  15 mg Oral QPM  . multivitamin with minerals  1 tablet Oral Daily  . pantoprazole  40 mg Oral Daily  . saccharomyces boulardii  250 mg Oral BID   Continuous Infusions: . sodium chloride 10 mL/hr at 12/16/18 0600  . dextrose 125 mL/hr at 12/24/18 7782    Active Problems:   AKI (acute kidney injury) ([place])   Unstageable pressure ulcer of sacral region [facility])   Enterocutaneous fistula   Palliative care encounter   Protein-calorie malnutrition ([place])   Advanced care planning/counseling discussion   LOS: 14 days   A & P  Acute kidney injury-patient presented with acute  kidney injury with creatinine 2.94, her baseline creatinine as of November 12, 2018 was 1.25.  Started on 0.45% normal saline at 100 mL/h as patient also had hypernatremia with sodium 156. Sodium today is 139. Creatinine is 1.32 today. Follow creatinine, electrolytes, and volume status carefully.   Hypernatremia: Resolved with TPN. Sodium is 133 today.   Anemia: Hemoglobin is 6.4 this morning. The patient will be transfused with 2 units of PRBC's. FOBT has been ordered.  Loose BM's: One yesterday. Loose BM's expected due to patient's very minimal intake of solid food.  Hypokalemia: Resolved. Monitor and supplement as necessary. Supplement.   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  No immediate surgical intervention required.  General surgery consulted and [name] in place. They have recommended CT again later this week. [name] has been placed and has had low output. If she continues to have low output, the patient may  be allowed to eat, per surgery, and discharged soon. I am unable to determine if this fisula is a complication of her surgery or not. Surgery states that the patient is having low output from fistula. They recommend weaning her off of TPN to oral diet. She may then be discharged. However, the patient is currently only taking in 50% of her nutitional caloric requirements.  Unstageable ulcer of the sacrum and Stage II pressure ulcer on buttock: Present on admission. Wound care consulted.   Cholecystitis-CT scan showed inflammation around gallbladder.  Will obtain abdominal ultrasound in a.m. No surgical intervention is planned.  Zosyn has been discontinued.   History of wide-complex tachycardia/CAD-continue amiodarone, Coreg.   Diabetes mellitus/hypoglycemia: IV D5 with IV 1//2 NS at 125 cc an hour.. Monitor. HbA1c 4.7.  I have seen and examined this patient myself. I have spent 30 minutes in her evaluation  and care.  DVT prophylaxis: Heparin Code Status: DNR Family Communication: No family present. I attempted to contact the patient's husband this morning about he drop in hemoglobin. There was no answer. Disposition Plan: tbd. Palliative care has been consulted. Hospice vs SNF. Husband is considering.  Asees Manfredi, DO [facility] Direct contact: see [email or web address]  7PM-7AM contact night coverage as above 12/24/2018, 7:46 PM  LOS: 1 day

## 2018-12-25 LAB — GLUCOSE, CAPILLARY
Glucose-Capillary: 101 mg/dL — ABNORMAL HIGH (ref 70–99)
Glucose-Capillary: 114 mg/dL — ABNORMAL HIGH (ref 70–99)
Glucose-Capillary: 120 mg/dL — ABNORMAL HIGH (ref 70–99)
Glucose-Capillary: 163 mg/dL — ABNORMAL HIGH (ref 70–99)
Glucose-Capillary: 89 mg/dL (ref 70–99)
Glucose-Capillary: 98 mg/dL (ref 70–99)

## 2018-12-25 NOTE — Consult Note (Signed)
   [facility] CM Inpatient Consult   12/25/2018  [name] 1940-05-19 [id number]   Follow up: [facility] previously active with [facility] - not active > 10 days hospitalized]  Chart reviewed briefly of the [facility] consult which is ongoing.  Plan: Following for progress, disposition and if ongoing [place] [facility] needs.  For questions, contact:  [name], RN BSN [place] Liaison  [phone] business mobile phone Toll free office [phone]  Fax number: [phone] [email or web address]

## 2018-12-25 NOTE — Progress Notes (Signed)
Nutrition Follow-up  DOCUMENTATION CODES:   Not applicable  INTERVENTION:   -Continue MVI with minerals daily -Continue 30 ml Prostat TID, each supplement provides 100 kcals and 15 grams of protein -Continue Boost Breeze po TID, each supplement provides 250 kcal and 9 grams of protein  -If pt decides to pursue feeding tube, recommend:  Initiate Osmolite 1.5 @ 20 ml/hr via increase by 10 ml every 8 hours to goal rate of 50 ml/hr.   30 ml Prostat daily.    If no IVFS, recommend 150 ml free water flush every 4 hours  Tube feeding regimen provides 1900 kcal (100% of needs), 90 grams of protein, and 914 ml of H2O.   -If feedings are initiated, recommending monitor K, Mg, and Phos daily x 3 days and replete as needed, due to high refeeding risk  NUTRITION DIAGNOSIS:   Increased nutrient needs related to chronic illness(EC fistula) as evidenced by estimated needs.  Ongoing  GOAL:   Patient will meet greater than or equal to 90% of their needs  Unmet  MONITOR:   PO intake, Supplement acceptance, Diet advancement, Labs, Weight trends, Skin, I & O's  REASON FOR ASSESSMENT:   Consult Calorie Count  ASSESSMENT:   [name]  is a 78 y.o. female, with history of anemia of chronic disease, ASCVD, AICD placement, history of breast cancer, chronic bronchitis, stage III CKD, systolic heart failure, hyperlipidemia, GERD, gout, hypertension, rheumatoid arthritis who recently had expiratory laparotomy with lysis of adhesions, bowel resection for ischemic bowel in May 2020.  She had large abdominal wound, which is healing by secondary intention.  As per family they noted feces coming from the wound.  No abdominal pain or fever.  Patient was brought to the hospital for further evaluation.  Patient is a poor historian.  History obtained from ED records.  8/21- CWOCN placed eakin pouch on EC fistula; midline catheter placed;tunnelled CVC placed;TPN initiated 8/25- s/p BSE- advanced to  dysphagia 1 diet with nectar thick liquids 8/27- TPN d/c 8/31- calorie count completed- pt consuming less than 40% of estimated needs  Reviewed I/O's: +4.9 L x 24 hours and +12.7 L since admission  UOP: 2.4 L x 24 hours  Drain output: 5 ml x 24 hours  Intake remains in adequate. Noted meal completion 20-40%. Pt refused all but on Boost Breeze supplement yesterday.   Goals of care discussions ongoing. Per palliative care notes, plan to meet with husband on Friday to further discuss options, including feeding tube placement secondary to ongoing poor oral intake.   Medications reviewed and include 5% dextrose solution @ 125 ml/hr.   Labs reviewed: CBGS: 96-109.   Diet Order:   Diet Order            DIET DYS 2 Room service appropriate? Yes; Fluid consistency: Thin  Diet effective now              EDUCATION NEEDS:   Not appropriate for education at this time  Skin:  Skin Assessment: Skin Integrity Issues: Skin Integrity Issues:: Stage II, Other (Comment) Stage II: buttocks Other: open abdomen with draining fistula- eakin pouch placed  Last BM:  12/25/18  Height:   Ht Readings from Last 1 Encounters:  12/10/18 5\' 3"  (1.6 m)    Weight:   Wt Readings from Last 1 Encounters:  12/10/18 67.1 kg    Ideal Body Weight:  52.3 kg  BMI:  Body mass index is 26.22 kg/m.  Estimated Nutritional Needs:   Kcal:  1800-2000  Protein:  90-105 grams  Fluid:  > 1.8 L    Lyndel Dancel A. Jimmye Norman, RD, LDN, [place] Registered Dietitian II Certified Diabetes Care and Education Specialist Pager: [phone] After hours Pager: [phone]

## 2018-12-25 NOTE — Progress Notes (Signed)
PROGRESS NOTE  [name] DTO:[id number] DOB: 05-09-1940 DOA: 12/10/2018 PCP: [name], MD  Brief History   [name]  is a 78 y.o. female, with history of anemia of chronic disease, ASCVD, AICD placement, history of breast cancer, chronic bronchitis, stage III CKD, systolic heart failure, hyperlipidemia, GERD, gout, hypertension, rheumatoid arthritis who recently had expiratory laparotomy with lysis of adhesions, bowel resection for ischemic bowel in May 2020.  She had large abdominal wound, which is healing by secondary intention.  As per family they noted feces coming from the wound.  No abdominal pain or fever.  Patient was brought to the hospital for further evaluation.  Patient is a poor historian.  History obtained from ED records.   CT of the abdomen pelvis was done, no results are available in epic due to technical issues.  ED physician spoke to radiologist verbally about the results.     As per ED physician, there is an enterocutaneous fistula and inflammation around the gallbladder.  No acute cholecystitis.  General surgery, Dr. Rosendo Gros was consulted, he recommended no immediate surgery at this time.  Recommended patient to transfer to [facility] and surgery have been consulted again and have seen patient this morning. The patient now has an Eakins pouch, wound care, and TPN. Cental venous catheter has been placed.  Lynann Beaver pouch has been placed and has had low output. If she continues to have low output, and can be weaned off of TPN to an oral diet, the patient may be discharged soon. However, currently she is only ingesting about 15% of her meals. Calorie count in place.  Surgery has stopped TPN as of 12/18/2018. Patient has been hypoglycemic. D5 NS at 75 cc/hr has been started. This morning her hemoglobin is down to 6.4. She is being transfused with 2 units of PRBC's. Palliative care has been consulted.  She has been evaluated by SLP. She has been placed on a dysphagia 2 diet  with fine chopped meats and this liquids by cup or straw. Staff to assist.  Hypoglycemia: Continueing issue. Rate of D51/2 NS has again been increased to 125 cc/hr. Monitor.  Severe Protein Calorie Malnutrition: I appreciate nutrition's assistance. She is only taking in 50% of required caloric intake.   Palliative care is involved and has discussed the patient with her husband who is considering issues considering future care of the patient including facility placement or hospice care and recurrent hospitalizations. It is a certainty that the patient will have recurrent hospitalizations as her oral intake is not sufficient to support her. They are continuing to work with him on making plans for the patient. PT has beenconsulted to evaluate the patient. The patient's husband seems to be delaying decision making.  Consultants  . General Surgery - signed off . Wound care . Nutrition . Palliative Care  Procedures  . PICC line placement.  Antibiotics   Anti-infectives (From admission, onward)   Start     Dose/Rate Route Frequency Ordered Stop   12/11/18 1000  piperacillin-tazobactam (ZOSYN) IVPB 3.375 g  Status:  Discontinued     3.375 g 12.5 mL/hr over 240 Minutes Intravenous Every 12 hours 12/10/18 2220 12/11/18 0522   12/11/18 1000  piperacillin-tazobactam (ZOSYN) IVPB 3.375 g  Status:  Discontinued     3.375 g 12.5 mL/hr over 240 Minutes Intravenous Every 12 hours 12/11/18 0524 12/12/18 1122   12/11/18 0800  piperacillin-tazobactam (ZOSYN) IVPB 2.25 g  Status:  Discontinued     2.25 g 100 mL/hr  over 30 Minutes Intravenous Every 6 hours 12/11/18 0522 12/11/18 0524   12/10/18 2215  piperacillin-tazobactam (ZOSYN) IVPB 3.375 g     3.375 g 100 mL/hr over 30 Minutes Intravenous  Once 12/10/18 2211 12/10/18 2256      Subjective  The patient is resting comfortably this morning. No new complaints.  Objective   Vitals:  Vitals:   12/25/18 0416 12/25/18 1622  BP: (!) 111/54 102/64   Pulse: 72 82  Resp: 17   Temp: 98.2 F (36.8 C) 99 F (37.2 C)  SpO2: 98% 100%    Exam:  Constitutional:  . The patient is somnolent Nonverbal. No acute distress.  Respiratory:  . No increased work of breathing. . No wheezes, rales, or rhonchi. . No tactile fremitus. Cardiovascular:  . Regular rate and rhythm. . No murmurs, ectopy, or gallups. . No lateral PMI. No thrills. Abdomen:  . Abdomen is soft, non-tender, non-distended. . No hernias, masses, or organomegaly. . Hypoactive bowel sounds . Eakins pouch in place. Musculoskeletal:  . No cyanosis, clubbing, or edema Skin:  . No rashes, lesions, ulcers . palpation of skin: no induration or nodules Neurologic:  . CN 2-12 intact . Sensation all 4 extremities intact Psychiatric:  . Unable to evaluate as the patient is unable to cooperate with exam.  I have personally reviewed the following:   Today's Data  . CMP, CBC, Vitals  Micro Data  . Nares MSSA and MRSA positive.  Imaging  . CT abdomen: Entero-cutaneous fistula.  Scheduled Meds: . amiodarone  200 mg Oral Daily  . carvedilol  3.125 mg Oral BID WC  . Chlorhexidine Gluconate Cloth  6 each Topical Daily  . feeding supplement  1 Container Oral TID BM  . feeding supplement (PRO-STAT SUGAR FREE 64)  30 mL Oral TID BM  . heparin  5,000 Units Subcutaneous Q8H  . mirtazapine  15 mg Oral QPM  . multivitamin with minerals  1 tablet Oral Daily  . pantoprazole  40 mg Oral Daily  . saccharomyces boulardii  250 mg Oral BID   Continuous Infusions: . sodium chloride 10 mL/hr at 12/25/18 0655  . dextrose 125 mL (12/25/18 1848)    Active Problems:   AKI (acute kidney injury) ([place])   Unstageable pressure ulcer of sacral region [facility])   Enterocutaneous fistula   Palliative care encounter   Protein-calorie malnutrition ([place])   Advanced care planning/counseling discussion   LOS: 15 days   A & P  Acute kidney injury-patient presented with acute kidney injury with  creatinine 2.94, her baseline creatinine as of November 12, 2018 was 1.25.  Started on 0.45% normal saline at 100 mL/h as patient also had hypernatremia with sodium 156. Sodium today is 139. Creatinine is 1.32 today. Follow creatinine, electrolytes, and volume status carefully.   Hypernatremia: Resolved with TPN. Sodium is 133 today.   Anemia: Hemoglobin is 6.4 this morning. The patient will be transfused with 2 units of PRBC's. FOBT has been ordered.  Loose BM's: One yesterday. Loose BM's expected due to patient's very minimal intake of solid food.  Hypokalemia: Resolved. Monitor and supplement as necessary. Supplement.   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  No immediate surgical intervention required.  General surgery consulted and Eakins pouch in place. They have recommended CT again later this week. Lynann Beaver pouch has been placed and has had low output. If she continues to have low output, the patient may be allowed  to eat, per surgery, and discharged soon. I am unable to determine if this fisula is a complication of her surgery or not. Surgery states that the patient is having low output from fistula. They recommend weaning her off of TPN to oral diet. She may then be discharged. However, the patient is currently only taking in 50% of her nutitional caloric requirements.  Unstageable ulcer of the sacrum and Stage II pressure ulcer on buttock: Present on admission. Wound care consulted.   Cholecystitis-CT scan showed inflammation around gallbladder.  Will obtain abdominal ultrasound in a.m. No surgical intervention is planned.  Zosyn has been discontinued.   History of wide-complex tachycardia/CAD-continue amiodarone, Coreg.   Diabetes mellitus/hypoglycemia: IV D5 with IV 1//2 NS at 125 cc an hour.. Monitor. HbA1c 4.7.  I have seen and examined this patient myself. I have spent 30 minutes in her evaluation and care.  DVT  prophylaxis: Heparin Code Status: DNR Family Communication: No family present. I attempted to contact the patient's husband this morning about he drop in hemoglobin. There was no answer. Disposition Plan: tbd. Palliative care has been consulted. Hospice vs SNF. Husband is considering.  Zehra Rucci, DO [facility] Direct contact: see [email or web address]  7PM-7AM contact night coverage as above 12/25/2018, 7:25 PM  LOS: 1 day

## 2018-12-26 LAB — CBC WITH DIFFERENTIAL/PLATELET
Abs Immature Granulocytes: 0.02 10*3/uL (ref 0.00–0.07)
Basophils Absolute: 0 10*3/uL (ref 0.0–0.1)
Basophils Relative: 1 %
Eosinophils Absolute: 0.2 10*3/uL (ref 0.0–0.5)
Eosinophils Relative: 4 %
HCT: 28.9 % — ABNORMAL LOW (ref 36.0–46.0)
Hemoglobin: 9.2 g/dL — ABNORMAL LOW (ref 12.0–15.0)
Immature Granulocytes: 0 %
Lymphocytes Relative: 27 %
Lymphs Abs: 1.5 10*3/uL (ref 0.7–4.0)
MCH: 28.6 pg (ref 26.0–34.0)
MCHC: 31.8 g/dL (ref 30.0–36.0)
MCV: 89.8 fL (ref 80.0–100.0)
Monocytes Absolute: 0.7 10*3/uL (ref 0.1–1.0)
Monocytes Relative: 12 %
Neutro Abs: 3.2 10*3/uL (ref 1.7–7.7)
Neutrophils Relative %: 56 %
Platelets: 206 10*3/uL (ref 150–400)
RBC: 3.22 MIL/uL — ABNORMAL LOW (ref 3.87–5.11)
RDW: 19 % — ABNORMAL HIGH (ref 11.5–15.5)
WBC: 5.6 10*3/uL (ref 4.0–10.5)
nRBC: 0 % (ref 0.0–0.2)

## 2018-12-26 LAB — BASIC METABOLIC PANEL
Anion gap: 6 (ref 5–15)
BUN: 12 mg/dL (ref 8–23)
CO2: 24 mmol/L (ref 22–32)
Calcium: 7.8 mg/dL — ABNORMAL LOW (ref 8.9–10.3)
Chloride: 103 mmol/L (ref 98–111)
Creatinine, Ser: 1.22 mg/dL — ABNORMAL HIGH (ref 0.44–1.00)
GFR calc Af Amer: 48 mL/min — ABNORMAL LOW (ref >60)
GFR calc non Af Amer: 42 mL/min — ABNORMAL LOW (ref >60)
Glucose, Bld: 98 mg/dL (ref 70–99)
Potassium: 4.2 mmol/L (ref 3.5–5.1)
Sodium: 133 mmol/L — ABNORMAL LOW (ref 135–145)

## 2018-12-26 LAB — GLUCOSE, CAPILLARY
Glucose-Capillary: 103 mg/dL — ABNORMAL HIGH (ref 70–99)
Glucose-Capillary: 104 mg/dL — ABNORMAL HIGH (ref 70–99)
Glucose-Capillary: 71 mg/dL (ref 70–99)
Glucose-Capillary: 79 mg/dL (ref 70–99)
Glucose-Capillary: 82 mg/dL (ref 70–99)
Glucose-Capillary: 84 mg/dL (ref 70–99)

## 2018-12-26 NOTE — Plan of Care (Signed)
  Problem: Coping: Goal: Level of anxiety will decrease Outcome: Progressing   Problem: Pain Managment: Goal: General experience of comfort will improve Outcome: Progressing   Problem: Safety: Goal: Ability to remain free from injury will improve Outcome: Progressing   

## 2018-12-26 NOTE — Progress Notes (Signed)
PROGRESS NOTE  [name] FFM:[id number] DOB: Dec 18, 1940 DOA: 12/10/2018 PCP: [name], MD  Brief History   [name]  is a 78 y.o. female, with history of anemia of chronic disease, ASCVD, AICD placement, history of breast cancer, chronic bronchitis, stage III CKD, systolic heart failure, hyperlipidemia, GERD, gout, hypertension, rheumatoid arthritis who recently had expiratory laparotomy with lysis of adhesions, bowel resection for ischemic bowel in May 2020.  She had large abdominal wound, which is healing by secondary intention.  As per family they noted feces coming from the wound.  No abdominal pain or fever.  Patient was brought to the hospital for further evaluation.  Patient is a poor historian.  History obtained from ED records.   CT of the abdomen pelvis was done, no results are available in epic due to technical issues.  ED physician spoke to radiologist verbally about the results.     As per ED physician, there is an enterocutaneous fistula and inflammation around the gallbladder.  No acute cholecystitis.  General surgery, Dr. Rosendo Gros was consulted, he recommended no immediate surgery at this time.  Recommended patient to transfer to [facility] and surgery have been consulted again and have seen patient this morning. The patient now has an Eakins pouch, wound care, and TPN. Cental venous catheter has been placed.  Lynann Beaver pouch has been placed and has had low output. If she continues to have low output, and can be weaned off of TPN to an oral diet, the patient may be discharged soon. However, currently she is only ingesting about 15% of her meals. Calorie count in place.  Surgery has stopped TPN as of 12/18/2018. Patient has been hypoglycemic. D5 NS at 75 cc/hr has been started. This morning her hemoglobin is down to 6.4. She is being transfused with 2 units of PRBC's. Palliative care has been consulted.  She has been evaluated by SLP. She has been placed on a dysphagia 2 diet  with fine chopped meats and this liquids by cup or straw. Staff to assist.  Hypoglycemia: Continueing issue. Rate of D51/2 NS has again been increased to 125 cc/hr. Monitor.  Severe Protein Calorie Malnutrition: I appreciate nutrition's assistance. She is only taking in 50% of required caloric intake.   Palliative care is involved and has discussed the patient with her husband who is considering issues considering future care of the patient including facility placement or hospice care and recurrent hospitalizations. It is a certainty that the patient will have recurrent hospitalizations as her oral intake is not sufficient to support her. They are continuing to work with him on making plans for the patient. PT has beenconsulted to evaluate the patient. The patient's husband seems to be delaying decision making.  Consultants  . General Surgery - signed off . Wound care . Nutrition . Palliative Care  Procedures  . PICC line placement.  Antibiotics   Anti-infectives (From admission, onward)   Start     Dose/Rate Route Frequency Ordered Stop   12/11/18 1000  piperacillin-tazobactam (ZOSYN) IVPB 3.375 g  Status:  Discontinued     3.375 g 12.5 mL/hr over 240 Minutes Intravenous Every 12 hours 12/10/18 2220 12/11/18 0522   12/11/18 1000  piperacillin-tazobactam (ZOSYN) IVPB 3.375 g  Status:  Discontinued     3.375 g 12.5 mL/hr over 240 Minutes Intravenous Every 12 hours 12/11/18 0524 12/12/18 1122   12/11/18 0800  piperacillin-tazobactam (ZOSYN) IVPB 2.25 g  Status:  Discontinued     2.25 g 100 mL/hr  over 30 Minutes Intravenous Every 6 hours 12/11/18 0522 12/11/18 0524   12/10/18 2215  piperacillin-tazobactam (ZOSYN) IVPB 3.375 g     3.375 g 100 mL/hr over 30 Minutes Intravenous  Once 12/10/18 2211 12/10/18 2256      Subjective  The patient is resting comfortably this morning. No new complaints.  Objective   Vitals:  Vitals:   12/26/18 0447 12/26/18 1434  BP: 116/68 (!) 115/51   Pulse: 84 70  Resp: 16 17  Temp: 98 F (36.7 C) 99.3 F (37.4 C)  SpO2: 97% 100%    Exam:  Constitutional:  . The patient is somnolent Nonverbal. No acute distress.  Respiratory:  . No increased work of breathing. . No wheezes, rales, or rhonchi. . No tactile fremitus. Cardiovascular:  . Regular rate and rhythm. . No murmurs, ectopy, or gallups. . No lateral PMI. No thrills. Abdomen:  . Abdomen is soft, non-tender, non-distended. . No hernias, masses, or organomegaly. . Hypoactive bowel sounds . Eakins pouch in place. Musculoskeletal:  . No cyanosis, clubbing, or edema Skin:  . No rashes, lesions, ulcers . palpation of skin: no induration or nodules Neurologic:  . CN 2-12 intact . Sensation all 4 extremities intact Psychiatric:  . Unable to evaluate as the patient is unable to cooperate with exam.  I have personally reviewed the following:   Today's Data  . CMP, CBC, Vitals  Micro Data  . Nares MSSA and MRSA positive.  Imaging  . CT abdomen: Entero-cutaneous fistula.  Scheduled Meds: . amiodarone  200 mg Oral Daily  . carvedilol  3.125 mg Oral BID WC  . Chlorhexidine Gluconate Cloth  6 each Topical Daily  . feeding supplement  1 Container Oral TID BM  . feeding supplement (PRO-STAT SUGAR FREE 64)  30 mL Oral TID BM  . heparin  5,000 Units Subcutaneous Q8H  . mirtazapine  15 mg Oral QPM  . multivitamin with minerals  1 tablet Oral Daily  . pantoprazole  40 mg Oral Daily  . saccharomyces boulardii  250 mg Oral BID   Continuous Infusions: . sodium chloride 10 mL/hr at 12/25/18 0655  . dextrose 125 mL/hr at 12/26/18 1557    Active Problems:   AKI (acute kidney injury) ([place])   Unstageable pressure ulcer of sacral region [facility])   Enterocutaneous fistula   Palliative care encounter   Protein-calorie malnutrition ([place])   Advanced care planning/counseling discussion   LOS: 16 days   A & P  Acute kidney injury-patient presented with acute kidney injury  with creatinine 2.94, her baseline creatinine as of November 12, 2018 was 1.25.  Started on 0.45% normal saline at 100 mL/h as patient also had hypernatremia with sodium 156. Sodium today is 139. Creatinine is 1.32 today. Follow creatinine, electrolytes, and volume status carefully.   Hypernatremia: Resolved. Sodium is 133 today.   Anemia: Hemoglobin is 6.4 this morning. The patient will be transfused with 2 units of PRBC's. FOBT has been ordered.  Loose BM's: One yesterday. Loose BM's expected due to patient's very minimal intake of solid food.  Hypokalemia: Resolved. Monitor and supplement as necessary. Supplement.   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  No immediate surgical intervention required.  General surgery consulted and Eakins pouch in place. They have recommended CT again later this week. Lynann Beaver pouch has been placed and has had low output. If she continues to have low output, the patient may be allowed to  eat, per surgery, and discharged soon. I am unable to determine if this fisula is a complication of her surgery or not. Surgery states that the patient is having low output from fistula. They recommend weaning her off of TPN to oral diet. She may then be discharged. However, the patient is currently only taking in 50% of her nutitional caloric requirements.  Unstageable ulcer of the sacrum and Stage II pressure ulcer on buttock: Present on admission. Wound care consulted.   Cholecystitis-CT scan showed inflammation around gallbladder.  Will obtain abdominal ultrasound in a.m. No surgical intervention is planned.  Zosyn has been discontinued.   History of wide-complex tachycardia/CAD-continue amiodarone, Coreg.   Diabetes mellitus/hypoglycemia: IV D5 with IV 1//2 NS at 125 cc an hour.. Monitor. HbA1c 4.7.  I have seen and examined this patient myself. I have spent 32 minutes in her evaluation and care.  DVT  prophylaxis: Heparin Code Status: DNR Family Communication: No family present. I attempted to contact the patient's husband this morning about he drop in hemoglobin. There was no answer. Disposition Plan: tbd. Palliative care has been consulted. Hospice vs SNF. Husband is considering.  Lujean Ebright, DO [facility] Direct contact: see [email or web address]  7PM-7AM contact night coverage as above 12/26/2018, 6:58 PM  LOS: 1 day

## 2018-12-26 NOTE — Progress Notes (Signed)
Physical Therapy Treatment Patient Details Name: [name] MRN: [id number] DOB: 1940/11/08 Today's Date: 12/26/2018    History of Present Illness Pt is an 78 y.o. female admitted 12/10/18 with large abdominal wound (recent admission 08/2018 for exlap with bowel resection), family noted feces coming from wound. Imaging showed enterocutaneous fistula; Eakins pouch placed. Pt also with unstageable sacral ulcer. PMH includes AICD, breast CA, chronic bronchitis, CKD III, gout, RA, HTN.    PT Comments    Pt was reluctant to do any mobility, but due to stool on pt and gown, therapist encouraged moving [place].  Emphasis on standing during pericare x2, transitions to/from EOB and sitting balance.    Follow Up Recommendations  SNF;Supervision/Assistance - 24 hour     Equipment Recommendations  Hospital bed    Recommendations for Other Services       Precautions / Restrictions Precautions Precautions: Fall Precaution Comments: Eakins pouch, PICC    Mobility  Bed Mobility Overal bed mobility: Needs Assistance Bed Mobility: Supine to Sit;Sit to Supine     Supine to sit: Max assist;HOB elevated Sit to supine: Max assist;+2 for safety/equipment   General bed mobility comments: Pt able to initiate movement to EOB with use of bed rail, maxA for trunk elevation and scooting hips to EOB. MaxA for return to supine and repositioning  Transfers Overall transfer level: Needs assistance Equipment used: None Transfers: Sit to/from Stand Sit to Stand: Max assist;Total assist         General transfer comment: Pt stood x2 with assist to come forward more than boosting up.  Pt very anxious in stance during pericare with bil UE grabbing around the neck and shoulders, but she stood x 45 sec. and 15 secs respectively, then was assisted to scoot transfer up to [facility].  Pt refused to get OOB to chair so we returned her to bed.  Ambulation/Gait                 Stairs             Wheelchair  Mobility    Modified Rankin (Stroke Patients Only)       Balance Overall balance assessment: Needs assistance   Sitting balance-Leahy Scale: Fair     Standing balance support: Bilateral upper extremity supported Standing balance-Leahy Scale: Zero Standing balance comment: stood statically with face to face assist during pericare.                            Cognition Arousal/Alertness: Awake/alert Behavior During Therapy: Flat affect Overall Cognitive Status: No family/caregiver present to determine baseline cognitive functioning                                        Exercises      General Comments        Pertinent Vitals/Pain Pain Assessment: Faces Faces Pain Scale: Hurts a little bit Pain Location: Stomach, generalized. Pain Descriptors / Indicators: Discomfort;Grimacing Pain Intervention(s): Monitored during session    Home Living                      Prior Function            PT Goals (current goals can now be found in the care plan section) Acute Rehab PT Goals PT Goal Formulation: With patient Time For Goal Achievement: 01/07/19  Potential to Achieve Goals: Fair Progress towards PT goals: Progressing toward goals    Frequency    Min 2X/week      PT Plan Current plan remains appropriate    Co-evaluation              AM-PAC PT "6 Clicks" Mobility   Outcome Measure  Help needed turning from your back to your side while in a flat bed without using bedrails?: A Lot Help needed moving from lying on your back to sitting on the side of a flat bed without using bedrails?: Total Help needed moving to and from a bed to a chair (including a wheelchair)?: Total Help needed standing up from a chair using your arms (e.g., wheelchair or bedside chair)?: Total Help needed to walk in hospital room?: Total Help needed climbing 3-5 steps with a railing? : Total 6 Click Score: 7    End of Session   Activity  Tolerance: Patient limited by fatigue Patient left: in bed;with call bell/phone within reach;with bed alarm set Nurse Communication: Mobility status;Other (comment)(may need lift due to pt's anxiety and relatively low assist) PT Visit Diagnosis: Other abnormalities of gait and mobility (R26.89);Muscle weakness (generalized) (M62.81)     Time: 6438-3779 PT Time Calculation (min) (ACUTE ONLY): 21 min  Charges:  $Therapeutic Activity: 8-22 mins                     12/26/2018  Donnella Sham, PT Acute Rehabilitation Services [phone]  (pager) [phone]  (office)   Tessie Fass Lakeem Rozo 12/26/2018, 5:51 PM

## 2018-12-27 LAB — GLUCOSE, CAPILLARY
Glucose-Capillary: 114 mg/dL — ABNORMAL HIGH (ref 70–99)
Glucose-Capillary: 84 mg/dL (ref 70–99)
Glucose-Capillary: 74 mg/dL (ref 70–99)
Glucose-Capillary: 91 mg/dL (ref 70–99)
Glucose-Capillary: 131 mg/dL — ABNORMAL HIGH (ref 70–99)
Glucose-Capillary: 102 mg/dL — ABNORMAL HIGH (ref 70–99)

## 2018-12-27 NOTE — Progress Notes (Signed)
PROGRESS NOTE  [name] RFF:[id number] DOB: Jun 04, 1940 DOA: 12/10/2018 PCP: [name], MD  Brief History   [name]  is a 78 y.o. female, with history of anemia of chronic disease, ASCVD, AICD placement, history of breast cancer, chronic bronchitis, stage III CKD, systolic heart failure, hyperlipidemia, GERD, gout, hypertension, rheumatoid arthritis who recently had expiratory laparotomy with lysis of adhesions, bowel resection for ischemic bowel in May 2020.  She had large abdominal wound, which is healing by secondary intention.  As per family they noted feces coming from the wound.  No abdominal pain or fever.  Patient was brought to the hospital for further evaluation.  Patient is a poor historian.  History obtained from ED records.   CT of the abdomen pelvis was done, no results are available in epic due to technical issues.  ED physician spoke to radiologist verbally about the results.     As per ED physician, there is an enterocutaneous fistula and inflammation around the gallbladder.  No acute cholecystitis.  General surgery, Dr. Rosendo Gros was consulted, he recommended no immediate surgery at this time.  Recommended patient to transfer to [facility] and surgery have been consulted again and have seen patient this morning. The patient now has an Eakins pouch, wound care, and TPN. Cental venous catheter has been placed.  [name] pouch has been placed and has had low output. If she continues to have low output, and can be weaned off of TPN to an oral diet, the patient may be discharged soon. However, currently she is only ingesting about 15% of her meals. Calorie count in place.  Surgery has stopped TPN as of 12/18/2018. Patient has been hypoglycemic. D5 NS at 75 cc/hr has been started. This morning her hemoglobin is down to 6.4. She is being transfused with 2 units of PRBC's. Palliative care has been consulted.  She has been evaluated by SLP. She has been placed on a dysphagia 2 diet  with fine chopped meats and this liquids by cup or straw. Staff to assist.  Hypoglycemia: Continueing issue. Rate of D51/2 NS has again been increased to 125 cc/hr. Monitor.  Severe Protein Calorie Malnutrition: I appreciate nutrition's assistance. She is only taking in 50% of required caloric intake.   Palliative care is involved and has discussed the patient with her husband who is considering issues considering future care of the patient including facility placement or hospice care and recurrent hospitalizations. It is a certainty that the patient will have recurrent hospitalizations as her oral intake is not sufficient to support her. They are continuing to work with him on making plans for the patient. PT has beenconsulted to evaluate the patient. The patient's husband seems to be delaying decision making.  Consultants  . General Surgery - signed off . Wound care . Nutrition . Palliative Care  Procedures  . PICC line placement.  Antibiotics   Anti-infectives (From admission, onward)   Start     Dose/Rate Route Frequency Ordered Stop   12/11/18 1000  piperacillin-tazobactam (ZOSYN) IVPB 3.375 g  Status:  Discontinued     3.375 g 12.5 mL/hr over 240 Minutes Intravenous Every 12 hours 12/10/18 2220 12/11/18 0522   12/11/18 1000  piperacillin-tazobactam (ZOSYN) IVPB 3.375 g  Status:  Discontinued     3.375 g 12.5 mL/hr over 240 Minutes Intravenous Every 12 hours 12/11/18 0524 12/12/18 1122   12/11/18 0800  piperacillin-tazobactam (ZOSYN) IVPB 2.25 g  Status:  Discontinued     2.25 g 100 mL/hr  over 30 Minutes Intravenous Every 6 hours 12/11/18 0522 12/11/18 0524   12/10/18 2215  piperacillin-tazobactam (ZOSYN) IVPB 3.375 g     3.375 g 100 mL/hr over 30 Minutes Intravenous  Once 12/10/18 2211 12/10/18 2256      Subjective  The patient is resting comfortably this morning. No new complaints.  Objective   Vitals:  Vitals:   12/27/18 0354 12/27/18 1408  BP: (!) 135/57 (!)  129/52  Pulse: 75 80  Resp: 18 18  Temp: 98.4 F (36.9 C) 99.1 F (37.3 C)  SpO2: 100% 100%    Exam:  Constitutional:  . The patient is somnolent Nonverbal. No acute distress.  Respiratory:  . No increased work of breathing. . No wheezes, rales, or rhonchi. . No tactile fremitus. Cardiovascular:  . Regular rate and rhythm. . No murmurs, ectopy, or gallups. . No lateral PMI. No thrills. Abdomen:  . Abdomen is soft, non-tender, non-distended. . No hernias, masses, or organomegaly. . Hypoactive bowel sounds . Eakins pouch in place. Musculoskeletal:  . No cyanosis, clubbing, or edema Skin:  . No rashes, lesions, ulcers . palpation of skin: no induration or nodules Neurologic:  . CN 2-12 intact . Sensation all 4 extremities intact Psychiatric:  . Unable to evaluate as the patient is unable to cooperate with exam.  I have personally reviewed the following:   Today's Data  . Orthoptist  . Nares MSSA and MRSA positive.  Imaging  . CT abdomen: Entero-cutaneous fistula.  Scheduled Meds: . amiodarone  200 mg Oral Daily  . carvedilol  3.125 mg Oral BID WC  . Chlorhexidine Gluconate Cloth  6 each Topical Daily  . feeding supplement  1 Container Oral TID BM  . feeding supplement (PRO-STAT SUGAR FREE 64)  30 mL Oral TID BM  . heparin  5,000 Units Subcutaneous Q8H  . mirtazapine  15 mg Oral QPM  . multivitamin with minerals  1 tablet Oral Daily  . pantoprazole  40 mg Oral Daily  . saccharomyces boulardii  250 mg Oral BID   Continuous Infusions: . sodium chloride Stopped (12/27/18 0810)  . dextrose 125 mL/hr at 12/27/18 1309    Active Problems:   AKI (acute kidney injury) ([name])   Unstageable pressure ulcer of sacral region [facility])   Enterocutaneous fistula   Palliative care encounter   Protein-calorie malnutrition ([place])   Advanced care planning/counseling discussion   LOS: 17 days   A & P  Acute kidney injury-patient presented with acute kidney injury  with creatinine 2.94, her baseline creatinine as of November 12, 2018 was 1.25.  Started on 0.45% normal saline at 100 mL/h as patient also had hypernatremia with sodium 156. Sodium today is 139. Creatinine is 1.32 today. Follow creatinine, electrolytes, and volume status carefully.   Hypernatremia: Resolved. Sodium is 133 today.   Anemia: Hemoglobin is 6.4 this morning. The patient will be transfused with 2 units of PRBC's. FOBT has been ordered.  Loose BM's: One yesterday. Loose BM's expected due to patient's very minimal intake of solid food.  Hypokalemia: Resolved. Monitor and supplement as necessary. Supplement.   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  No immediate surgical intervention required.  General surgery consulted and Eakins pouch in place. They have recommended CT again later this week. [name] pouch has been placed and has had low output. If she continues to have low output, the patient may be allowed to eat, per surgery,  and discharged soon. I am unable to determine if this fisula is a complication of her surgery or not. Surgery states that the patient is having low output from fistula. They recommend weaning her off of TPN to oral diet. She may then be discharged. However, the patient is currently only taking in 50% of her nutitional caloric requirements.  Unstageable ulcer of the sacrum and Stage II pressure ulcer on buttock: Present on admission. Wound care consulted.   Cholecystitis-CT scan showed inflammation around gallbladder.  Will obtain abdominal ultrasound in a.m. No surgical intervention is planned.  Zosyn has been discontinued.   History of wide-complex tachycardia/CAD-continue amiodarone, Coreg.   Diabetes mellitus/hypoglycemia: IV D5 with IV 1//2 NS at 125 cc an hour.. Monitor. HbA1c 4.7.  I have seen and examined this patient myself. I have spent 30 minutes in her evaluation and care.  DVT  prophylaxis: Heparin Code Status: DNR Family Communication: No family present. I attempted to contact the patient's husband this morning about he drop in hemoglobin. There was no answer. Disposition Plan: tbd. Palliative care has been consulted. Hospice vs SNF. Husband is considering.  Giomar Gusler, DO [facility] Direct contact: see [email or web address]  7PM-7AM contact night coverage as above 12/27/2018, 5:43 PM  LOS: 1 day

## 2018-12-28 LAB — GLUCOSE, CAPILLARY
Glucose-Capillary: 100 mg/dL — ABNORMAL HIGH (ref 70–99)
Glucose-Capillary: 103 mg/dL — ABNORMAL HIGH (ref 70–99)
Glucose-Capillary: 139 mg/dL — ABNORMAL HIGH (ref 70–99)
Glucose-Capillary: 82 mg/dL (ref 70–99)
Glucose-Capillary: 90 mg/dL (ref 70–99)
Glucose-Capillary: 97 mg/dL (ref 70–99)
Glucose-Capillary: 99 mg/dL (ref 70–99)

## 2018-12-28 NOTE — Progress Notes (Signed)
PROGRESS NOTE  JANVI AMMAR EPP:[id number] DOB: May 15, 1940 DOA: 12/10/2018 PCP: [name], MD  Brief History   Westlyn Glaza  is a 78 y.o. female, with history of anemia of chronic disease, ASCVD, AICD placement, history of breast cancer, chronic bronchitis, stage III CKD, systolic heart failure, hyperlipidemia, GERD, gout, hypertension, rheumatoid arthritis who recently had expiratory laparotomy with lysis of adhesions, bowel resection for ischemic bowel in May 2020.  She had large abdominal wound, which is healing by secondary intention.  As per family they noted feces coming from the wound.  No abdominal pain or fever.  Patient was brought to the hospital for further evaluation.  Patient is a poor historian.  History obtained from ED records.   CT of the abdomen pelvis was done, no results are available in epic due to technical issues.  ED physician spoke to radiologist verbally about the results.     As per ED physician, there is an enterocutaneous fistula and inflammation around the gallbladder.  No acute cholecystitis.  General surgery, Dr. Rosendo Gros was consulted, he recommended no immediate surgery at this time.  Recommended patient to transfer to [facility] and surgery have been consulted again and have seen patient this morning. The patient now has an Eakins pouch, wound care, and TPN. Cental venous catheter has been placed.  Lynann Beaver pouch has been placed and has had low output. If she continues to have low output, and can be weaned off of TPN to an oral diet, the patient may be discharged soon. However, currently she is only ingesting about 15% of her meals. Calorie count in place.  Surgery has stopped TPN as of 12/18/2018. Patient has been hypoglycemic. D5 NS at 75 cc/hr has been started. This morning her hemoglobin is down to 6.4. She is being transfused with 2 units of PRBC's. Palliative care has been consulted.  She has been evaluated by SLP. She has been placed on a dysphagia 2 diet  with fine chopped meats and this liquids by cup or straw. Staff to assist.  Hypoglycemia: Continueing issue. Rate of D51/2 NS has again been increased to 125 cc/hr. Monitor.  Severe Protein Calorie Malnutrition: I appreciate nutrition's assistance. She is only taking in 50% of required caloric intake.   Palliative care is involved and has discussed the patient with her husband who is considering issues considering future care of the patient including facility placement or hospice care and recurrent hospitalizations. It is a certainty that the patient will have recurrent hospitalizations as her oral intake is not sufficient to support her. They are continuing to work with him on making plans for the patient. PT has beenconsulted to evaluate the patient. The patient's husband seems to be delaying decision making.  Consultants  . General Surgery - signed off . Wound care . Nutrition . Palliative Care  Procedures  . PICC line placement.  Antibiotics   Anti-infectives (From admission, onward)   Start     Dose/Rate Route Frequency Ordered Stop   12/11/18 1000  piperacillin-tazobactam (ZOSYN) IVPB 3.375 g  Status:  Discontinued     3.375 g 12.5 mL/hr over 240 Minutes Intravenous Every 12 hours 12/10/18 2220 12/11/18 0522   12/11/18 1000  piperacillin-tazobactam (ZOSYN) IVPB 3.375 g  Status:  Discontinued     3.375 g 12.5 mL/hr over 240 Minutes Intravenous Every 12 hours 12/11/18 0524 12/12/18 1122   12/11/18 0800  piperacillin-tazobactam (ZOSYN) IVPB 2.25 g  Status:  Discontinued     2.25 g 100 mL/hr  over 30 Minutes Intravenous Every 6 hours 12/11/18 0522 12/11/18 0524   12/10/18 2215  piperacillin-tazobactam (ZOSYN) IVPB 3.375 g     3.375 g 100 mL/hr over 30 Minutes Intravenous  Once 12/10/18 2211 12/10/18 2256      Subjective  The patient is resting comfortably this morning. No new complaints.  Objective   Vitals:  Vitals:   12/28/18 0439 12/28/18 1546  BP: (!) 125/53 111/70   Pulse: 80 72  Resp: 20 19  Temp: 99.1 F (37.3 C) 99.5 F (37.5 C)  SpO2: 100% 100%    Exam:  Constitutional:  . The patient is somnolent Nonverbal. No acute distress.  Respiratory:  . No increased work of breathing. . No wheezes, rales, or rhonchi. . No tactile fremitus. Cardiovascular:  . Regular rate and rhythm. . No murmurs, ectopy, or gallups. . No lateral PMI. No thrills. Abdomen:  . Abdomen is soft, non-tender, non-distended. . No hernias, masses, or organomegaly. . Hypoactive bowel sounds . Eakins pouch in place. Musculoskeletal:  . No cyanosis, clubbing, or edema Skin:  . No rashes, lesions, ulcers . palpation of skin: no induration or nodules Neurologic:  . CN 2-12 intact . Sensation all 4 extremities intact Psychiatric:  . Unable to evaluate as the patient is unable to cooperate with exam.  I have personally reviewed the following:   Today's Data  . Orthoptist  . Nares MSSA and MRSA positive.  Imaging  . CT abdomen: Entero-cutaneous fistula.  Scheduled Meds: . amiodarone  200 mg Oral Daily  . carvedilol  3.125 mg Oral BID WC  . Chlorhexidine Gluconate Cloth  6 each Topical Daily  . feeding supplement  1 Container Oral TID BM  . feeding supplement (PRO-STAT SUGAR FREE 64)  30 mL Oral TID BM  . heparin  5,000 Units Subcutaneous Q8H  . mirtazapine  15 mg Oral QPM  . multivitamin with minerals  1 tablet Oral Daily  . pantoprazole  40 mg Oral Daily  . saccharomyces boulardii  250 mg Oral BID   Continuous Infusions: . sodium chloride Stopped (12/27/18 0810)  . dextrose 125 mL/hr at 12/28/18 1458    Active Problems:   AKI (acute kidney injury) ([place])   Unstageable pressure ulcer of sacral region [facility])   Enterocutaneous fistula   Palliative care encounter   Protein-calorie malnutrition ([place])   Advanced care planning/counseling discussion   LOS: 18 days   A & P  Acute kidney injury-patient presented with acute kidney injury with  creatinine 2.94, her baseline creatinine as of November 12, 2018 was 1.25.  Started on 0.45% normal saline at 100 mL/h as patient also had hypernatremia with sodium 156. Sodium today is 139. Creatinine is 1.32 today. Follow creatinine, electrolytes, and volume status carefully.   Hypernatremia: Resolved. Sodium is 133 today.   Anemia: Hemoglobin is 6.4 this morning. The patient will be transfused with 2 units of PRBC's. FOBT has been ordered.  Loose BM's: One yesterday. Loose BM's expected due to patient's very minimal intake of solid food.  Hypokalemia: Resolved. Monitor and supplement as necessary. Supplement.   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  No immediate surgical intervention required.  General surgery consulted and Eakins pouch in place. They have recommended CT again later this week. Lynann Beaver pouch has been placed and has had low output. If she continues to have low output, the patient may be allowed to eat, per surgery, and  discharged soon. I am unable to determine if this fisula is a complication of her surgery or not. Surgery states that the patient is having low output from fistula. They recommend weaning her off of TPN to oral diet. She may then be discharged. However, the patient is currently only taking in 50% of her nutitional caloric requirements.  Unstageable ulcer of the sacrum and Stage II pressure ulcer on buttock: Present on admission. Wound care consulted.   Cholecystitis-CT scan showed inflammation around gallbladder.  Will obtain abdominal ultrasound in a.m. No surgical intervention is planned.  Zosyn has been discontinued.   History of wide-complex tachycardia/CAD-continue amiodarone, Coreg.   Diabetes mellitus/hypoglycemia: IV D5 with IV 1//2 NS at 125 cc an hour.. Monitor. HbA1c 4.7. Glucoses have run 82 - 103 over the past 24 hours.  I have seen and examined this patient myself. I have spent 32  minutes in her evaluation and care.  DVT prophylaxis: Heparin Code Status: DNR Family Communication: No family present. I attempted to contact the patient's husband this morning about he drop in hemoglobin. There was no answer. Disposition Plan: tbd. Palliative care has been consulted. Hospice vs SNF. Husband is considering.  Shruthi Northrup, DO [facility] Direct contact: see [email or web address]  7PM-7AM contact night coverage as above 12/28/2018, 4:09 PM  LOS: 1 day

## 2018-12-29 DIAGNOSIS — E46 Unspecified protein-calorie malnutrition: Secondary | ICD-10-CM

## 2018-12-29 DIAGNOSIS — Z7189 Other specified counseling: Secondary | ICD-10-CM

## 2018-12-29 LAB — BASIC METABOLIC PANEL
Anion gap: 6 (ref 5–15)
BUN: 8 mg/dL (ref 8–23)
CO2: 26 mmol/L (ref 22–32)
Calcium: 7.9 mg/dL — ABNORMAL LOW (ref 8.9–10.3)
Chloride: 98 mmol/L (ref 98–111)
Creatinine, Ser: 1.22 mg/dL — ABNORMAL HIGH (ref 0.44–1.00)
GFR calc Af Amer: 48 mL/min — ABNORMAL LOW (ref >60)
GFR calc non Af Amer: 42 mL/min — ABNORMAL LOW (ref >60)
Glucose, Bld: 93 mg/dL (ref 70–99)
Potassium: 4 mmol/L (ref 3.5–5.1)
Sodium: 130 mmol/L — ABNORMAL LOW (ref 135–145)

## 2018-12-29 LAB — CBC WITH DIFFERENTIAL/PLATELET
Abs Immature Granulocytes: 0.01 10*3/uL (ref 0.00–0.07)
Basophils Absolute: 0.1 10*3/uL (ref 0.0–0.1)
Basophils Relative: 1 %
Eosinophils Absolute: 0.1 10*3/uL (ref 0.0–0.5)
Eosinophils Relative: 2 %
HCT: 29.6 % — ABNORMAL LOW (ref 36.0–46.0)
Hemoglobin: 9.4 g/dL — ABNORMAL LOW (ref 12.0–15.0)
Immature Granulocytes: 0 %
Lymphocytes Relative: 26 %
Lymphs Abs: 1.9 10*3/uL (ref 0.7–4.0)
MCH: 28.1 pg (ref 26.0–34.0)
MCHC: 31.8 g/dL (ref 30.0–36.0)
MCV: 88.4 fL (ref 80.0–100.0)
Monocytes Absolute: 0.9 10*3/uL (ref 0.1–1.0)
Monocytes Relative: 12 %
Neutro Abs: 4.2 10*3/uL (ref 1.7–7.7)
Neutrophils Relative %: 59 %
Platelets: 249 10*3/uL (ref 150–400)
RBC: 3.35 MIL/uL — ABNORMAL LOW (ref 3.87–5.11)
RDW: 18.5 % — ABNORMAL HIGH (ref 11.5–15.5)
WBC: 7.2 10*3/uL (ref 4.0–10.5)
nRBC: 0 % (ref 0.0–0.2)

## 2018-12-29 LAB — GLUCOSE, CAPILLARY
Glucose-Capillary: 97 mg/dL (ref 70–99)
Glucose-Capillary: 96 mg/dL (ref 70–99)
Glucose-Capillary: 103 mg/dL — ABNORMAL HIGH (ref 70–99)
Glucose-Capillary: 98 mg/dL (ref 70–99)
Glucose-Capillary: 100 mg/dL — ABNORMAL HIGH (ref 70–99)

## 2018-12-29 NOTE — Progress Notes (Signed)
PROGRESS NOTE  [name] OVF:[id number] DOB: [birth year] DOA: 12/10/2018 PCP: [name], MD  Brief History   [name]  is a 78 y.o. female, with history of anemia of chronic disease, ASCVD, AICD placement, history of breast cancer, chronic bronchitis, stage III CKD, systolic heart failure, hyperlipidemia, GERD, gout, hypertension, rheumatoid arthritis who recently had expiratory laparotomy with lysis of adhesions, bowel resection for ischemic bowel in May 2020.  She had large abdominal wound, which is healing by secondary intention.  As per family they noted feces coming from the wound.  No abdominal pain or fever.  Patient was brought to the hospital for further evaluation.  Patient is a poor historian.  History obtained from ED records.   CT of the abdomen pelvis was done, no results are available in epic due to technical issues.  ED physician spoke to radiologist verbally about the results.     As per ED physician, there is an enterocutaneous fistula and inflammation around the gallbladder.  No acute cholecystitis.  General surgery, Dr. Rosendo Gros was consulted, he recommended no immediate surgery at this time.  Recommended patient to transfer to [facility] and surgery have been consulted again and have seen patient this morning. The patient now has an Eakins pouch, wound care, and TPN. Cental venous catheter has been placed.  Lynann Beaver pouch has been placed and has had low output. If she continues to have low output, and can be weaned off of TPN to an oral diet, the patient may be discharged soon. However, currently she is only ingesting about 15% of her meals. Calorie count in place.  Surgery has stopped TPN as of 12/18/2018. Patient has been hypoglycemic. D5 NS at 75 cc/hr has been started. This morning her hemoglobin is down to 6.4. She is being transfused with 2 units of PRBC's. Palliative care has been consulted.  She has been evaluated by SLP. She has been placed on a dysphagia 2 diet  with fine chopped meats and this liquids by cup or straw. Staff to assist.  Hypoglycemia: Continueing issue. Rate of D51/2 NS has again been increased to 125 cc/hr. Monitor.  Severe Protein Calorie Malnutrition: I appreciate nutrition's assistance. She is only taking in 50% of required caloric intake.   Palliative care is involved and has discussed the patient with her husband who is considering issues considering future care of the patient including facility placement or hospice care and recurrent hospitalizations. It is a certainty that the patient will have recurrent hospitalizations as her oral intake is not sufficient to support her. They are continuing to work with him on making plans for the patient. PT has beenconsulted to evaluate the patient. The patient's husband seems to be delaying decision making. The patient will be returned to SNF tomorrow possibly with hospice.  Consultants  . General Surgery - signed off . Wound care . Nutrition . Palliative Care  Procedures  . PICC line placement.  Antibiotics   Anti-infectives (From admission, onward)   Start     Dose/Rate Route Frequency Ordered Stop   12/11/18 1000  piperacillin-tazobactam (ZOSYN) IVPB 3.375 g  Status:  Discontinued     3.375 g 12.5 mL/hr over 240 Minutes Intravenous Every 12 hours 12/10/18 2220 12/11/18 0522   12/11/18 1000  piperacillin-tazobactam (ZOSYN) IVPB 3.375 g  Status:  Discontinued     3.375 g 12.5 mL/hr over 240 Minutes Intravenous Every 12 hours 12/11/18 0524 12/12/18 1122   12/11/18 0800  piperacillin-tazobactam (ZOSYN) IVPB 2.25 g  Status:  Discontinued     2.25 g 100 mL/hr over 30 Minutes Intravenous Every 6 hours 12/11/18 0522 12/11/18 0524   12/10/18 2215  piperacillin-tazobactam (ZOSYN) IVPB 3.375 g     3.375 g 100 mL/hr over 30 Minutes Intravenous  Once 12/10/18 2211 12/10/18 2256      Subjective  The patient is resting comfortably this morning. No new complaints.  Objective   Vitals:   Vitals:   12/28/18 2007 12/29/18 0418  BP: (!) 117/55 (!) 115/57  Pulse: 86 76  Resp: 17 17  Temp: 99.7 F (37.6 C) 98.8 F (37.1 C)  SpO2: 97% 100%    Exam:  Constitutional:  . The patient is somnolent Nonverbal. No acute distress.  Respiratory:  . No increased work of breathing. . No wheezes, rales, or rhonchi. . No tactile fremitus. Cardiovascular:  . Regular rate and rhythm. . No murmurs, ectopy, or gallups. . No lateral PMI. No thrills. Abdomen:  . Abdomen is soft, non-tender, non-distended. . No hernias, masses, or organomegaly. . Hypoactive bowel sounds . Eakins pouch in place. Musculoskeletal:  . No cyanosis, clubbing, or edema Skin:  . No rashes, lesions, ulcers . palpation of skin: no induration or nodules Neurologic:  . CN 2-12 intact . Sensation all 4 extremities intact Psychiatric:  . Unable to evaluate as the patient is unable to cooperate with exam.  I have personally reviewed the following:   Today's Data  . Orthoptist  . Nares MSSA and MRSA positive.  Imaging  . CT abdomen: Entero-cutaneous fistula.  Scheduled Meds: . amiodarone  200 mg Oral Daily  . carvedilol  3.125 mg Oral BID WC  . Chlorhexidine Gluconate Cloth  6 each Topical Daily  . feeding supplement  1 Container Oral TID BM  . feeding supplement (PRO-STAT SUGAR FREE 64)  30 mL Oral TID BM  . heparin  5,000 Units Subcutaneous Q8H  . mirtazapine  15 mg Oral QPM  . multivitamin with minerals  1 tablet Oral Daily  . pantoprazole  40 mg Oral Daily  . saccharomyces boulardii  250 mg Oral BID   Continuous Infusions: . sodium chloride Stopped (12/27/18 0810)  . dextrose 125 mL/hr at 12/29/18 1526    Active Problems:   AKI (acute kidney injury) ([place])   Unstageable pressure ulcer of sacral region [facility])   Enterocutaneous fistula   Palliative care encounter   Protein-calorie malnutrition ([place])   Advanced care planning/counseling discussion   LOS: 19 days   A & P   Acute kidney injury-patient presented with acute kidney injury with creatinine 2.94, her baseline creatinine as of November 12, 2018 was 1.25.  Started on 0.45% normal saline at 100 mL/h as patient also had hypernatremia with sodium 156. Sodium today is 139. Creatinine is 1.32 today. Follow creatinine, electrolytes, and volume status carefully.   Hypernatremia: Resolved. Sodium is 133 today.   Anemia: Hemoglobin is 6.4 this morning. The patient will be transfused with 2 units of PRBC's. FOBT has been ordered.  Loose BM's: One yesterday. Loose BM's expected due to patient's very minimal intake of solid food.  Hypokalemia: Resolved. Monitor and supplement as necessary. Supplement.   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  No immediate surgical intervention required.  General surgery consulted and Eakins pouch in place. They have recommended CT again later this week. Lynann Beaver pouch has been placed and has had low output. If she continues to have  low output, the patient may be allowed to eat, per surgery, and discharged soon. I am unable to determine if this fisula is a complication of her surgery or not. Surgery states that the patient is having low output from fistula. They recommend weaning her off of TPN to oral diet. She may then be discharged. However, the patient is currently only taking in 20-40% of her nutitional caloric requirements.  Unstageable ulcer of the sacrum and Stage II pressure ulcer on buttock: Present on admission. Wound care consulted.   Cholecystitis-CT scan showed inflammation around gallbladder.  Will obtain abdominal ultrasound in a.m. No surgical intervention is planned.  Zosyn has been discontinued.   History of wide-complex tachycardia/CAD-continue amiodarone, Coreg.   Diabetes mellitus/hypoglycemia: IV D5 with IV 1//2 NS at 125 cc an hour.. Monitor. HbA1c 4.7. Glucoses have run 82 - 103 over the past 24  hours.  I have seen and examined this patient myself. I have spent 30 minutes in her evaluation and care.  DVT prophylaxis: Heparin Code Status: DNR Family Communication: No family present. I attempted to contact the patient's husband this morning about he drop in hemoglobin. There was no answer. Disposition Plan: tbd. Palliative care has been consulted. Hospice vs SNF. Husband is considering.  Mckenna Gamm, DO [facility] Direct contact: see [email or web address]  7PM-7AM contact night coverage as above 12/29/2018, 3:37 PM  LOS: 1 day

## 2018-12-29 NOTE — Plan of Care (Signed)
  Problem: Elimination: Goal: Will not experience complications related to bowel motility Outcome: Progressing Goal: Will not experience complications related to urinary retention Outcome: Progressing   Problem: Pain Managment: Goal: General experience of comfort will improve Outcome: Progressing   

## 2018-12-30 DIAGNOSIS — K819 Cholecystitis, unspecified: Secondary | ICD-10-CM

## 2018-12-30 LAB — GLUCOSE, CAPILLARY
Glucose-Capillary: 87 mg/dL (ref 70–99)
Glucose-Capillary: 95 mg/dL (ref 70–99)
Glucose-Capillary: 100 mg/dL — ABNORMAL HIGH (ref 70–99)
Glucose-Capillary: 101 mg/dL — ABNORMAL HIGH (ref 70–99)
Glucose-Capillary: 103 mg/dL — ABNORMAL HIGH (ref 70–99)
Glucose-Capillary: 112 mg/dL — ABNORMAL HIGH (ref 70–99)

## 2018-12-30 LAB — NOVEL CORONAVIRUS, NAA (HOSP ORDER, SEND-OUT TO REF LAB; TAT 18-24 HRS): SARS-CoV-2, NAA: NOT DETECTED

## 2018-12-30 NOTE — Progress Notes (Signed)
Physical Therapy Treatment Patient Details Name: JANASHA BARKALOW MRN: [id number] DOB: 1941-04-23 Today's Date: 12/30/2018    History of Present Illness Pt is an 78 y.o. female admitted 12/10/18 with large abdominal wound (recent admission 08/2018 for exlap with bowel resection), family noted feces coming from wound. Imaging showed enterocutaneous fistula; Eakins pouch placed. Pt also with unstageable sacral ulcer. PMH includes AICD, breast CA, chronic bronchitis, CKD III, gout, RA, HTN.    PT Comments    Pt progressing with functional mobility, willing to participate and transfer OOB this session. Increased anxiety with movement, pt continues to repeat "now just hold on a minute" throughout session. Pt tolerated sit<>stand practice from EOB with max-total assist, and transferred to the recliner stand pivot max assist. Pt would continue to benefit from skilled therapy tx to address weakness, balance, and mobility impairments. Continue to recommend SNF level follow up therapy to maximize independence with functional mobility.   Follow Up Recommendations  SNF;Supervision/Assistance - 24 hour     Equipment Recommendations  Other (comment)(tbd next venue)    Recommendations for Other Services       Precautions / Restrictions Precautions Precautions: Fall Precaution Comments: Eakins pouch, PICC Restrictions Weight Bearing Restrictions: No    Mobility  Bed Mobility Overal bed mobility: Needs Assistance Bed Mobility: Supine to Sit     Supine to sit: Max assist;HOB elevated     General bed mobility comments: Pt able to initiate movement to EOB with use of bed rail, maxA for trunk elevation and scooting hips to EOB.  Transfers Overall transfer level: Needs assistance Equipment used: None Transfers: Sit to/from [facility] Sit to Stand: Max assist;Total assist Stand pivot transfers: Max assist;Total assist       General transfer comment: Pt performed x 2 sit<>stands  from EOB with max-total assist from therapist, unable to achieve full hip/trunk extension in standing, pt anxious with movement. Pt performed stand pivot to the recliner with max assist, able to take small pivotal steps with cues for techniques.  Ambulation/Gait Ambulation/Gait assistance: Max assist Gait Distance (Feet): 1 Feet Assistive device: None       General Gait Details: pivotal steps to the recliner   Stairs             Wheelchair Mobility    Modified Rankin (Stroke Patients Only)       Balance Overall balance assessment: Needs assistance Sitting-balance support: Single extremity supported;Feet supported Sitting balance-Leahy Scale: Poor Sitting balance - Comments: pt relies on UE support in sitting with use of bedrail, cues to correct posterior lean Postural control: Posterior lean Standing balance support: During functional activity;Bilateral upper extremity supported Standing balance-Leahy Scale: Zero Standing balance comment: B UE support required for standing balance, holding onto therapists hips while standing face to face                            Cognition Arousal/Alertness: Awake/alert Behavior During Therapy: Flat affect Overall Cognitive Status: No family/caregiver present to determine baseline cognitive functioning Area of Impairment: Memory;Following commands;Problem solving                     Memory: Decreased short-term memory Following Commands: Follows one step commands with increased time   Awareness: Intellectual Problem Solving: Slow processing;Requires verbal cues General Comments: Difficult to assess due to very [facility]      Exercises General Exercises - Lower Extremity Long Arc Quad: AROM;Both;10 reps;Seated  General Comments        Pertinent Vitals/Pain Pain Assessment: No/denies pain    Home Living                      Prior Function            PT Goals (current goals can now be found  in the care plan section) Progress towards PT goals: Progressing toward goals    Frequency    Min 2X/week      PT Plan Current plan remains appropriate    Co-evaluation              AM-PAC PT "6 Clicks" Mobility   Outcome Measure  Help needed turning from your back to your side while in a flat bed without using bedrails?: A Lot Help needed moving from lying on your back to sitting on the side of a flat bed without using bedrails?: A Lot Help needed moving to and from a bed to a chair (including a wheelchair)?: Total Help needed standing up from a chair using your arms (e.g., wheelchair or bedside chair)?: Total Help needed to walk in hospital room?: Total Help needed climbing 3-5 steps with a railing? : Total 6 Click Score: 8    End of Session Equipment Utilized During Treatment: Gait belt Activity Tolerance: Patient limited by fatigue Patient left: in bed;with call bell/phone within reach;with bed alarm set Nurse Communication: Mobility status PT Visit Diagnosis: Other abnormalities of gait and mobility (R26.89);Muscle weakness (generalized) (M62.81)     Time: 8270-7867 PT Time Calculation (min) (ACUTE ONLY): 23 min  Charges:  $Therapeutic Activity: 23-37 mins                     Netta Corrigan, PT, DPT Acute Rehab Office [place] 12/30/2018, 1:53 PM

## 2018-12-30 NOTE — Progress Notes (Signed)
PALLIATIVE NOTE:  Patient resting in bed on arrival. She did open eyes to voice commands. Denied pain, mumbled "No" when asked. No family at the bedside.   Multiple attempts to contact husband by our team over the past 2 days. I myself contacted him prior to arrival to patient's room and also while in room with patient. No answer and message left. I am unsure if husband is avoiding making further decisions in regards to his wife's care. We will continue to make attempts to continue Dwight discussions.   If patient is stable for discharge prior to additional discussions with husband, recommending outpatient palliative to be involved for continued discussions. Patient's prognosis remains poor and she is appropriate for outpatient hospice support however, husband would have to be in agreement and able to sign-off on paperwork to initiate their services.   PMT will continue to support and follow.   Assessment: -NAD, sleeping but arousable -RRR, diminished bases -awake, unable to assess mentation, will not follow commands  Plan: -DNR/DNI -Continue current plan of care per attending -PMT will continue to make attempts to speak with husband for continued goals of care discussion. Patient is appropriate for outpatient hospice support, however, in the event she is discharged prior to further discussion outpatient palliative at minimum for continued discussion and recommendations.    Total Time: 20 min.   Greater than 50%  of this time was spent counseling and coordinating care related to the above assessment and plan.  Alda Lea, AGPCNP-BC Palliative Medicine Team

## 2018-12-30 NOTE — Progress Notes (Signed)
Nutrition Follow-up  DOCUMENTATION CODES:   Not applicable  INTERVENTION:   -Continue MVI with minerals daily -Continue 30 ml Prostat TID, each supplement provides 100 kcals and 15 grams of protein -Continue Boost Breeze po TID, each supplement provides 250 kcal and 9 grams of protein  NUTRITION DIAGNOSIS:   Increased nutrient needs related to chronic illness(EC fistula) as evidenced by estimated needs.  Ongoing  GOAL:   Patient will meet greater than or equal to 90% of their needs  Progressing   MONITOR:   PO intake, Supplement acceptance, Diet advancement, Labs, Weight trends, Skin, I & O's  REASON FOR ASSESSMENT:   Consult Calorie Count  ASSESSMENT:   Lilou Kneip  is a 78 y.o. female, with history of anemia of chronic disease, ASCVD, AICD placement, history of breast cancer, chronic bronchitis, stage III CKD, systolic heart failure, hyperlipidemia, GERD, gout, hypertension, rheumatoid arthritis who recently had expiratory laparotomy with lysis of adhesions, bowel resection for ischemic bowel in May 2020.  She had large abdominal wound, which is healing by secondary intention.  As per family they noted feces coming from the wound.  No abdominal pain or fever.  Patient was brought to the hospital for further evaluation.  Patient is a poor historian.  History obtained from ED records.  8/21- CWOCN placed eakin pouch on EC fistula; midline catheter placed;tunnelled CVC placed;TPN initiated 8/25- s/p BSE- advanced to dysphagia 1 diet with nectar thick liquids 8/27- TPN d/c 8/31- calorie count completed- pt consuming less than 40% of estimated needs  Reviewed I/O's: -607 ml x 24 hours and +9.9 L since 12/16/18  UOP: 2.2 L x 24 hours  Drain output: 0 ml x 24 hours  Pt remains with poor appetite; noted meal completion 20-50%. Pt is refusing supplements per MAR.  Palliative care following for goals of care discussions; plan for SNF vs hospice.   Medications reviewed and  include remeron and dextrose 5% solution @ 125 ml/hr.   Labs reviewed: CBGS: 87-98.   Diet Order:   Diet Order            DIET DYS 2 Room service appropriate? Yes; Fluid consistency: Thin  Diet effective now              EDUCATION NEEDS:   Not appropriate for education at this time  Skin:  Skin Assessment: Skin Integrity Issues: Skin Integrity Issues:: Stage II, Other (Comment) Stage II: buttocks Other: open abdomen with draining fistula- eakin pouch placed  Last BM:  12/29/18  Height:   Ht Readings from Last 1 Encounters:  12/10/18 5\' 3"  (1.6 m)    Weight:   Wt Readings from Last 1 Encounters:  12/10/18 67.1 kg    Ideal Body Weight:  52.3 kg  BMI:  Body mass index is 26.22 kg/m.  Estimated Nutritional Needs:   Kcal:  1800-2000  Protein:  90-105 grams  Fluid:  > 1.8 L    Latisia Hilaire A. Jimmye Norman, RD, LDN, [place] Registered Dietitian II Certified Diabetes Care and Education Specialist Pager: [phone] After hours Pager: [phone]

## 2018-12-30 NOTE — Progress Notes (Signed)
PROGRESS NOTE  [name] VCB:[id number] DOB: March 30, 1941 DOA: 12/10/2018 PCP: [name], MD  Brief History   [name]  is a 78 y.o. female, with history of anemia of chronic disease, ASCVD, AICD placement, history of breast cancer, chronic bronchitis, stage III CKD, systolic heart failure, hyperlipidemia, GERD, gout, hypertension, rheumatoid arthritis who recently had expiratory laparotomy with lysis of adhesions, bowel resection for ischemic bowel in May 2020.  She had large abdominal wound, which is healing by secondary intention.  As per family they noted feces coming from the wound.  No abdominal pain or fever.  Patient was brought to the hospital for further evaluation.  Patient is a poor historian.  History obtained from ED records.   CT of the abdomen pelvis was done, no results are available in epic due to technical issues.  ED physician spoke to radiologist verbally about the results.     As per ED physician, there is an enterocutaneous fistula and inflammation around the gallbladder.  No acute cholecystitis.  General surgery, Dr. Rosendo Gros was consulted, he recommended no immediate surgery at this time.  Recommended patient to transfer to [facility] and surgery have been consulted again and have seen patient this morning. The patient now has an Eakins pouch, wound care, and TPN. Cental venous catheter has been placed.  Lynann Beaver pouch has been placed and has had low output. If she continues to have low output, and can be weaned off of TPN to an oral diet, the patient may be discharged soon. However, currently she is only ingesting about 15% of her meals. Calorie count in place.  Surgery has stopped TPN as of 12/18/2018. Patient has been hypoglycemic. D5 NS at 75 cc/hr has been started. This morning her hemoglobin is down to 6.4. She is being transfused with 2 units of PRBC's. Palliative care has been consulted.  She has been evaluated by SLP. She has been placed on a dysphagia 2 diet  with fine chopped meats and this liquids by cup or straw. Staff to assist.  Hypoglycemia: Continueing issue. Rate of D51/2 NS has again been increased to 125 cc/hr. Monitor.  Severe Protein Calorie Malnutrition: I appreciate nutrition's assistance. She is only taking in 50% of required caloric intake.   Palliative care is involved and has discussed the patient with her husband who is considering issues considering future care of the patient including facility placement or hospice care and recurrent hospitalizations. It is a certainty that the patient will have recurrent hospitalizations as her oral intake is not sufficient to support her. They are continuing to work with him on making plans for the patient. PT has beenconsulted to evaluate the patient. The patient's husband seems to be delaying decision making. The patient will be returned to SNF tomorrow possibly with hospice.  Consultants  . General Surgery - signed off . Wound care . Nutrition . Palliative Care  Procedures  . PICC line placement.  Antibiotics   Anti-infectives (From admission, onward)   Start     Dose/Rate Route Frequency Ordered Stop   12/11/18 1000  piperacillin-tazobactam (ZOSYN) IVPB 3.375 g  Status:  Discontinued     3.375 g 12.5 mL/hr over 240 Minutes Intravenous Every 12 hours 12/10/18 2220 12/11/18 0522   12/11/18 1000  piperacillin-tazobactam (ZOSYN) IVPB 3.375 g  Status:  Discontinued     3.375 g 12.5 mL/hr over 240 Minutes Intravenous Every 12 hours 12/11/18 0524 12/12/18 1122   12/11/18 0800  piperacillin-tazobactam (ZOSYN) IVPB 2.25 g  Status:  Discontinued     2.25 g 100 mL/hr over 30 Minutes Intravenous Every 6 hours 12/11/18 0522 12/11/18 0524   12/10/18 2215  piperacillin-tazobactam (ZOSYN) IVPB 3.375 g     3.375 g 100 mL/hr over 30 Minutes Intravenous  Once 12/10/18 2211 12/10/18 2256      Subjective  The patient is resting comfortably this morning. No new complaints.  Objective   Vitals:   Vitals:   12/30/18 0344 12/30/18 1509  BP: (!) 116/52 (!) 138/58  Pulse: 78 77  Resp: (!) 21 18  Temp: 99.9 F (37.7 C) 99.5 F (37.5 C)  SpO2: 100% 95%    Exam:  Constitutional:  . The patient is somnolent Nonverbal. No acute distress.  Respiratory:  . No increased work of breathing. . No wheezes, rales, or rhonchi. . No tactile fremitus. Cardiovascular:  . Regular rate and rhythm. . No murmurs, ectopy, or gallups. . No lateral PMI. No thrills. Abdomen:  . Abdomen is soft, non-tender, non-distended. . No hernias, masses, or organomegaly. . Hypoactive bowel sounds . Eakins pouch in place. Musculoskeletal:  . No cyanosis, clubbing, or edema Skin:  . No rashes, lesions, ulcers . palpation of skin: no induration or nodules Neurologic:  . CN 2-12 intact . Sensation all 4 extremities intact Psychiatric:  . Unable to evaluate as the patient is unable to cooperate with exam.  I have personally reviewed the following:   Today's Data  . Orthoptist  . Nares MSSA and MRSA positive.  Imaging  . CT abdomen: Entero-cutaneous fistula.  Scheduled Meds: . amiodarone  200 mg Oral Daily  . carvedilol  3.125 mg Oral BID WC  . Chlorhexidine Gluconate Cloth  6 each Topical Daily  . feeding supplement  1 Container Oral TID BM  . feeding supplement (PRO-STAT SUGAR FREE 64)  30 mL Oral TID BM  . heparin  5,000 Units Subcutaneous Q8H  . mirtazapine  15 mg Oral QPM  . multivitamin with minerals  1 tablet Oral Daily  . pantoprazole  40 mg Oral Daily  . saccharomyces boulardii  250 mg Oral BID   Continuous Infusions: . sodium chloride Stopped (12/27/18 0810)  . dextrose 125 mL/hr at 12/30/18 1800    Active Problems:   AKI (acute kidney injury) ([place])   Unstageable pressure ulcer of sacral region [facility])   Enterocutaneous fistula   Palliative care encounter   Protein-calorie malnutrition ([place])   Advanced care planning/counseling discussion   LOS: 20 days   A & P   Acute kidney injury-patient presented with acute kidney injury with creatinine 2.94, her baseline creatinine as of November 12, 2018 was 1.25.  Started on 0.45% normal saline at 100 mL/h as patient also had hypernatremia with sodium 156. Sodium today is 130. Creatinine is 1.22 12/29/2018. Follow creatinine, electrolytes, and volume status carefully.   Hypernatremia: Resolved. Sodium is 130 today.   Anemia: Hemoglobin is 9.4 this morning. She has required transfusion with 2 units of PRBC's.  Loose BM's: One yesterday. Loose BM's expected due to patient's very minimal intake of solid food.  Hypokalemia: Resolved. Monitor and supplement as necessary. Supplement.   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  No immediate surgical intervention required.  General surgery consulted and Eakins pouch in place. They have recommended CT again later this week. Lynann Beaver pouch has been placed and has had low output. If she continues to have low output, the patient  may be allowed to eat, per surgery, and discharged soon. I am unable to determine if this fisula is a complication of her surgery or not. Surgery states that the patient is having low output from fistula. They recommend weaning her off of TPN to oral diet. She may then be discharged. However, the patient is currently only taking in 20-40% of her nutitional caloric requirements.  Unstageable ulcer of the sacrum and Stage II pressure ulcer on buttock: Present on admission. Wound care consulted.   Cholecystitis-CT scan showed inflammation around gallbladder.  Will obtain abdominal ultrasound in a.m. No surgical intervention is planned.  Zosyn has been discontinued.   History of wide-complex tachycardia/CAD-continue amiodarone, Coreg.   Diabetes mellitus/hypoglycemia: IV D5 with IV 1//2 NS at 125 cc an hour.. Monitor. HbA1c 4.7. Glucoses have run 82 - 103 over the past 24 hours.  I have seen and  examined this patient myself. I have spent 28 minutes in her evaluation and care.  DVT prophylaxis: Heparin Code Status: DNR Family Communication: No family present. I have attempted to contact the patient's husband on several occassion regarding the patient's hypoglycemia due to poor PO intake requiring infusion of dextrose. I have yet to reach him. Disposition Plan: tbd. Palliative care has been consulted. Hospice vs SNF. Husband is considering.  Jameal Razzano, DO [facility] Direct contact: see [email or web address]  7PM-7AM contact night coverage as above 12/30/2018, 6:54 PM  LOS: 1 day

## 2018-12-30 NOTE — Plan of Care (Signed)
  Problem: Education: Goal: Knowledge of General Education information will improve Description: Including pain rating scale, medication(s)/side effects and non-pharmacologic comfort measures Outcome: Progressing   Problem: Health Behavior/Discharge Planning: Goal: Ability to manage health-related needs will improve Outcome: Progressing   Problem: Clinical Measurements: Goal: Will remain free from infection Outcome: Progressing   Problem: Coping: Goal: Level of anxiety will decrease Outcome: Progressing   Problem: Elimination: Goal: Will not experience complications related to bowel motility Outcome: Progressing Goal: Will not experience complications related to urinary retention Outcome: Progressing   Problem: Pain Managment: Goal: General experience of comfort will improve Outcome: Progressing   Problem: Safety: Goal: Ability to remain free from injury will improve Outcome: Progressing   Problem: Skin Integrity: Goal: Risk for impaired skin integrity will decrease Outcome: Progressing

## 2018-12-31 ENCOUNTER — Encounter: Payer: Medicare Other | Admitting: *Deleted

## 2018-12-31 LAB — BASIC METABOLIC PANEL
Anion gap: 9 (ref 5–15)
BUN: 7 mg/dL — ABNORMAL LOW (ref 8–23)
CO2: 24 mmol/L (ref 22–32)
Calcium: 7.8 mg/dL — ABNORMAL LOW (ref 8.9–10.3)
Chloride: 95 mmol/L — ABNORMAL LOW (ref 98–111)
Creatinine, Ser: 1.14 mg/dL — ABNORMAL HIGH (ref 0.44–1.00)
GFR calc Af Amer: 53 mL/min — ABNORMAL LOW (ref >60)
GFR calc non Af Amer: 45 mL/min — ABNORMAL LOW (ref >60)
Glucose, Bld: 94 mg/dL (ref 70–99)
Potassium: 3.5 mmol/L (ref 3.5–5.1)
Sodium: 128 mmol/L — ABNORMAL LOW (ref 135–145)

## 2018-12-31 LAB — GLUCOSE, CAPILLARY
Glucose-Capillary: 106 mg/dL — ABNORMAL HIGH (ref 70–99)
Glucose-Capillary: 82 mg/dL (ref 70–99)
Glucose-Capillary: 94 mg/dL (ref 70–99)
Glucose-Capillary: 109 mg/dL — ABNORMAL HIGH (ref 70–99)

## 2018-12-31 MED ORDER — BIOTENE DRY MOUTH MT LIQD: 15.0000 mL | OROMUCOSAL | Status: DC | PRN

## 2018-12-31 MED ORDER — GLYCOPYRROLATE 0.2 MG/ML IJ SOLN: 0.2000 mg | INTRAMUSCULAR | Status: DC | PRN

## 2018-12-31 MED ORDER — POLYVINYL ALCOHOL 1.4 % OP SOLN
1.0000 [drp] | Freq: Four times a day (QID) | OPHTHALMIC | Status: DC | PRN
  Filled 2018-12-31: qty 15

## 2018-12-31 MED ORDER — GLYCOPYRROLATE 1 MG PO TABS
1.0000 mg | ORAL_TABLET | ORAL | Status: DC | PRN
  Filled 2018-12-31: qty 1

## 2018-12-31 NOTE — TOC Progression Note (Addendum)
Transition of Care [facility]) - Progression Note    Patient Details  Name: [name] MRN: [id number] Date of Birth: [birth year]  Transition of Care [facility]) CM/SW Contact  Jacalyn Lefevre Edson Snowball, RN Phone Number: 12/31/2018, 10:06 AM  Clinical Narrative:     Patient's husband [name] is in agreement with residential hospice, his preference is [facility].  Called Cassandra with [facility]. Referral given. Vito Backers will review clinicals and determine if patient qualifies. Awaiting call back.   Patient's husband [name] called bedside. NCM was able to speak to him. Explained importance of discussing his wife's plan of care with attending MD and palliative care . The best number to call [name] at is [phone]. Number given to MD and PC.     Barriers to Discharge: Continued Medical Work up  Expected Discharge Plan and Services         Living arrangements for the past 2 months: Single Family Home                                       Social Determinants of Health (SDOH) Interventions    Readmission Risk Interventions Readmission Risk Prevention Plan 09/17/2018 08/14/2018  Transportation Screening - Complete  Palliative Care Screening Complete -  Some recent data might be hidden

## 2018-12-31 NOTE — Progress Notes (Signed)
Nutrition Brief Note  Chart reviewed. Pt now transitioning to comfort care.  No further nutrition interventions warranted at this time.  Please re-consult as needed.   Khaliyah [employer] Sobczyk, RD, LDN, CDCES Registered Dietitian II Certified Diabetes Care and Education Specialist Pager: [phone] After hours Pager: [phone]  

## 2018-12-31 NOTE — Care Management Important Message (Signed)
Important Message  Patient Details  Name: [name] MRN: [id number] Date of Birth: [birth year]   Medicare Important Message Given:  Yes     Memory Argue 12/31/2018, 4:22 PM   PATIENT ON CONTACT PRECAUTION. IM GIVEN TO NURSE [facility] FOR PATIENT

## 2018-12-31 NOTE — Plan of Care (Signed)
  Problem: Education: Goal: Knowledge of General Education information will improve Description: Including pain rating scale, medication(s)/side effects and non-pharmacologic comfort measures Outcome: Progressing   Problem: Health Behavior/Discharge Planning: Goal: Ability to manage health-related needs will improve Outcome: Progressing   Problem: Clinical Measurements: Goal: Respiratory complications will improve Outcome: Progressing Goal: Cardiovascular complication will be avoided Outcome: Progressing   Problem: Pain Managment: Goal: General experience of comfort will improve Outcome: Progressing   Problem: Safety: Goal: Ability to remain free from injury will improve Outcome: Progressing   Problem: Skin Integrity: Goal: Risk for impaired skin integrity will decrease Outcome: Progressing   

## 2018-12-31 NOTE — Progress Notes (Signed)
PROGRESS NOTE  [name] SNK:[id number] DOB: [birth year] DOA: 12/10/2018 PCP: [name], MD  Brief History   [name]  is a 78 y.o. female, with history of anemia of chronic disease, ASCVD, AICD placement, history of breast cancer, chronic bronchitis, stage III CKD, systolic heart failure, hyperlipidemia, GERD, gout, hypertension, rheumatoid arthritis who recently had expiratory laparotomy with lysis of adhesions, bowel resection for ischemic bowel in May 2020.  She had large abdominal wound, which is healing by secondary intention.  As per family they noted feces coming from the wound.  No abdominal pain or fever.  Patient was brought to the hospital for further evaluation.  Patient is a poor historian.  History obtained from ED records. CT of the abdomen pelvis was done, no results are available in epic due to technical issues.  ED physician spoke to radiologist verbally about the results. As per ED physician, there is an enterocutaneous fistula and inflammation around the gallbladder.  No acute cholecystitis.  General surgery, Dr. Rosendo Gros was consulted, he recommended no immediate surgery at this time.  Recommended patient to transfer to [facility] and surgery have been consulted again and have seen patient this morning. The patient now has an Eakins pouch, wound care, and TPN. Cental venous catheter has been placed.  Lynann Beaver pouch has been placed and has had low output. If she continues to have low output, and can be weaned off of TPN to an oral diet, the patient may be discharged soon. However, currently she is only ingesting about 15% of her meals. Calorie count in place. Surgery has stopped TPN as of 12/18/2018. Patient has been hypoglycemic. D5 NS at 75 cc/hr has been started. This morning her hemoglobin is down to 6.4. She is being transfused with 2 units of PRBC's. She has been evaluated by SLP. She has been placed on a dysphagia 2 diet with fine chopped meats and this liquids by cup or  straw. Hypoglycemia: Ongoing issue. Rate of D51/2 NS has again been increased to 125 cc/hr.   Palliative care is involved and has discussed the patient with her husband who is considering issues considering future care of the patient including facility placement or hospice care and recurrent hospitalizations. It is a certainty that the patient will have recurrent hospitalizations as her oral intake is not sufficient to support her.   Patient's prognosis is grim. Gven her poor p.o. intake, advanced age, multiple comorbidities and severe protein caloric malnutrition it is my opinion that the patient's prognosis remains grim with extremely high risk for morbidity and mortality.  Consultants  . General Surgery - signed off . Wound care . Nutrition . Palliative Care  Procedures  . PICC line placement.  Antibiotics   Anti-infectives (From admission, onward)   Start     Dose/Rate Route Frequency Ordered Stop   12/11/18 1000  piperacillin-tazobactam (ZOSYN) IVPB 3.375 g  Status:  Discontinued     3.375 g 12.5 mL/hr over 240 Minutes Intravenous Every 12 hours 12/10/18 2220 12/11/18 0522   12/11/18 1000  piperacillin-tazobactam (ZOSYN) IVPB 3.375 g  Status:  Discontinued     3.375 g 12.5 mL/hr over 240 Minutes Intravenous Every 12 hours 12/11/18 0524 12/12/18 1122   12/11/18 0800  piperacillin-tazobactam (ZOSYN) IVPB 2.25 g  Status:  Discontinued     2.25 g 100 mL/hr over 30 Minutes Intravenous Every 6 hours 12/11/18 0522 12/11/18 0524   12/10/18 2215  piperacillin-tazobactam (ZOSYN) IVPB 3.375 g     3.375 g 100  mL/hr over 30 Minutes Intravenous  Once 12/10/18 2211 12/10/18 2256      Subjective  The patient is resting comfortably this morning. No new complaints.  Remains poorly verbal.  Objective   Vitals:  Vitals:   12/30/18 2116 12/31/18 0346  BP: (!) 128/51 (!) 112/55  Pulse: 76 72  Resp:    Temp: 99.9 F (37.7 C) 99 F (37.2 C)  SpO2: 100% 100%    Exam:  Constitutional:   . The patient is somnolent Nonverbal. No acute distress.  Respiratory:  . No increased work of breathing. . No wheezes, rales, or rhonchi. . No tactile fremitus. Cardiovascular:  . Regular rate and rhythm. . No murmurs, ectopy, or gallups. . No lateral PMI. No thrills. Abdomen:  . Abdomen is soft, non-tender, non-distended. . No hernias, masses, or organomegaly. . Hypoactive bowel sounds . Eakins pouch in place. Musculoskeletal:  . No cyanosis, clubbing, or edema Skin:  . No rashes, lesions, ulcers . palpation of skin: no induration or nodules Neurologic:  . Moving all extremities without overt deficit  I have personally reviewed the following:   Today's Data  . Orthoptist  . Nares MSSA and MRSA positive.  Imaging  . CT abdomen: Entero-cutaneous fistula.  Scheduled Meds: . amiodarone  200 mg Oral Daily  . carvedilol  3.125 mg Oral BID WC  . Chlorhexidine Gluconate Cloth  6 each Topical Daily  . feeding supplement  1 Container Oral TID BM  . feeding supplement (PRO-STAT SUGAR FREE 64)  30 mL Oral TID BM  . heparin  5,000 Units Subcutaneous Q8H  . mirtazapine  15 mg Oral QPM  . multivitamin with minerals  1 tablet Oral Daily  . pantoprazole  40 mg Oral Daily  . saccharomyces boulardii  250 mg Oral BID   Continuous Infusions: . sodium chloride Stopped (12/27/18 0810)  . dextrose 125 mL/hr at 12/31/18 0520    Active Problems:   AKI (acute kidney injury) ([place])   Unstageable pressure ulcer of sacral region [facility])   Enterocutaneous fistula   Palliative care encounter   Protein-calorie malnutrition ([place])   Advanced care planning/counseling discussion   LOS: 21 days   A & P  Acute kidney injury-patient presented with acute kidney injury with creatinine 2.94, her baseline creatinine as of November 12, 2018 was 1.25.  Started on 0.45% normal saline at 100 mL/h as patient also had hypernatremia with sodium 156. Sodium today is 130. Creatinine is 1.22 12/29/2018.  Follow creatinine, electrolytes, and volume status carefully.   Hypernatremia: Resolved. Sodium is 130 today.   Anemia: Hemoglobin is 9.4 this morning. She has required transfusion with 2 units of PRBC's.  Loose BM's: One yesterday. Loose BM's expected due to patient's very minimal intake of solid food.  Hypokalemia: Resolved. Monitor and supplement as necessary. Supplement.   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  No immediate surgical intervention required.  General surgery consulted and Eakins pouch in place. They have recommended CT again later this week. Lynann Beaver pouch has been placed and has had low output. If she continues to have low output, the patient may be allowed to eat, per surgery, and discharged soon. I am unable to determine if this fisula is a complication of her surgery or not. Surgery states that the patient is having low output from fistula. They recommend weaning her off of TPN to oral diet. She may then be discharged. However, the  patient is currently only taking in 20-40% of her nutitional caloric requirements.  Unstageable ulcer of the sacrum and Stage II pressure ulcer on buttock: Present on admission. Wound care consulted.   Cholecystitis-CT scan showed inflammation around gallbladder.  Will obtain abdominal ultrasound in a.m. No surgical intervention is planned.  Zosyn has been discontinued.   History of wide-complex tachycardia/CAD-continue amiodarone, Coreg.   Diabetes mellitus/hypoglycemia: IV D5 with IV 1//2 NS at 125 cc an hour.. Monitor. HbA1c 4.7. Glucoses have run 82 - 103 over the past 24 hours.  DVT prophylaxis: SCDs Code Status: DNR Family Communication: No family present.  Husband has been in contact with case management/nursing, continues to be difficult to obtain accurate phone number -no answer today on multiple phone numbers listed in the chart. Disposition Plan: Palliative  care/hospice pending final decision and discussion with family.  Holli Humbles, DO [facility] Direct contact: see [email or web address]  7PM-7AM contact night coverage as above

## 2018-12-31 NOTE — Consult Note (Signed)
[place] Nurse wound follow up Wound type:healing surgical midline abdominal incision with EC fistula at the distal end.  Patient very hard of hearing but understands why I am here.  Measurement:POuch intact and connected to bedside drainage.  Last changed 12/29/18 per chart.  Wound bed:100% pink, clean visualized through pouch  Drainage (amount, consistency, odor)liquid brown Periwound:not assessed today Dressing procedure/placement/frequency: small Eakin in place; intact. Connected to  BSD the bedside 2 small [name] [phone] [facility] team following and will see weekly.  Domenic Moras MSN, RN, FNP-BC CWON Wound, Ostomy, Continence Nurse Pager [phone]

## 2018-12-31 NOTE — Progress Notes (Addendum)
Daily Progress Note   Patient Name: [name]       Date: 12/31/2018 DOB: 11/20/1940  Age: 78 y.o. MRN#: [id number] Attending Physician: Little Ishikawa, MD Primary Care Physician: [name], MD Admit Date: 12/10/2018  Reason for Consultation/Follow-up: Establishing goals of care  Subjective: Patient in bed, awake, alert. She denies pain when asked. She is clearing throat with noticeable clear phlegm, which she is able to expectorate. She continues to have poor po intake with less than 20% intake over the past week. Continues on D5 due to hypoglycemic episodes related to her poor nutritional intake. No family at the bedside.   I called and was able to speak with her husband at length via phone. He was apologetic regarding not being able to meet and follow-up with medical team expressing his hectic work schedule. We discussed in detail patient's condition, and overall functional and nutritional state. Mr. Olazabal is tearful in discussion explaining he was hopeful she would "turn around". He shares his experiences with her being her after her previous hospitalization. He states his niece, Satrina was trying to look after patient however she was so weak and it was a struggle for them to get her to eat. We discussed her poor prognosis and minimal to no signs of improvement despite time and interventions. He verbalized understanding stating "I think she is tired and her body is just tired!"  I used this opportunity to discuss and elaborate on his statement. We discussed continuing to perform interventions to her or allowing her to be comfortable and provide care for her to be comfortable during the end-of-life process. Husband is tearful stating he and his niece has discussed this and at this point  they do not wish to continue watching her suffer. He questioned what it would look like if they continued with aggressive care. I educated and discussed with him continued re-hospitalizations, further decline, and possible suffering having to have multiple test and interventions. He expressed this is not what he would want.   I discussed comfort and what this would look like in the hospital and also at a hospice home vs home with family. He understands the care needed and states he can not provide the care needed and emotionally unsure if he can manage. He is requesting residential hospice facility. He states  they reside in [place] and would like consideration at the [facility] facility. Support given. I discussed the referral process with him and he verbalized understanding. Mr. Schickling also requesting to speak with his wife and hopefully inform her of his decision to see if she has any input.   I went to patient's bedside and made her aware her husband was on the phone. She answered to his voice calling him by name. They expressed their love for each other and husband tearfully informed patient that he felt it was best for her to go to the hospice home and pass away there comfortably. Mrs. Knecht did respond asking me to move the phone to the left side so she could hear clearer. Once I did this she stated back to him "I am going to the hospice home, is it [facility]?" He confirmed location. Patient stated "Ok if you think that is best, I will go wherever, will you be able to come take me?" I educated patient that ems will take her for safety. She verbalized "ok". Husband asked for me to explain hospice to her to provide him comfort in knowing she was made aware. I explained hospice and comfort care. Patient nodded yes during discussion and stated "ok, so I will die there? I responded to her question making her aware that is most likely but also with acknowledgement that sometimes patients thrive for longer  than expected and at that time further discussions may occur. Patient replied "ok, if my husband says ok" Mr. Bouska expressed his wishes and love to patient. I again discussed comfort care while hospitalized with discontinuation of medications not focused on comfort and discontinuing frequent finger sticks etc. Husband verbalized understanding and patient stated "thank God!". All questions answered to the best of my ability.    Length of Stay: 21  Current Medications: Scheduled Meds:   amiodarone  200 mg Oral Daily   carvedilol  3.125 mg Oral BID WC   Chlorhexidine Gluconate Cloth  6 each Topical Daily   mirtazapine  15 mg Oral QPM   saccharomyces boulardii  250 mg Oral BID    Continuous Infusions:  dextrose 50 mL/hr at 12/31/18 1404    PRN Meds: acetaminophen, albuterol, antiseptic oral rinse, food thickener, glycopyrrolate **OR** glycopyrrolate, lidocaine, ondansetron **OR** ondansetron (ZOFRAN) IV, oxyCODONE, polyvinyl alcohol, Resource ThickenUp Clear, sodium chloride flush  Physical Exam: NAD, cachectic, chronically-ill appearing Congested cough, diminished bases  RRR Hypoactive bowel sounds, Eakin pouch in place Skin warm and dry Awake, alert to self and husband, communicating appropriately to questions, following commands           Vital Signs: BP (!) 112/55 (BP Location: Left Arm)    Pulse 72    Temp 99 F (37.2 C) (Axillary)    Resp 18    Ht 5\' 3"  (1.6 m)    Wt 67.1 kg    SpO2 100%    BMI 26.22 kg/m  SpO2: SpO2: 100 % O2 Device: O2 Device: Room Air O2 Flow Rate:    Intake/output summary:   Intake/Output Summary (Last 24 hours) at 12/31/2018 1533 Last data filed at 12/31/2018 0930 Gross per 24 hour  Intake 2895.38 ml  Output 2300 ml  Net 595.38 ml   LBM: Last BM Date: 12/29/18 Baseline Weight: Weight: 67.1 kg Most recent weight: Weight: 67.1 kg       Palliative Assessment/Data: PPS: 10%     Patient Active Problem List   Diagnosis Date Noted    Protein-calorie malnutrition (  [place])    Advanced care planning/counseling discussion    Enterocutaneous fistula    Palliative care encounter    Unstageable pressure ulcer of sacral region [facility]) 12/11/2018   Goals of care, counseling/discussion    Palliative care by specialist    Acute respiratory failure with hypoxia ([place])    Protein-calorie malnutrition, severe 08/25/2018   Wide-complex tachycardia ([place])    Acute renal failure superimposed on stage 3 chronic kidney disease ([place]) 08/18/2018   SBO (small bowel obstruction) ([place]) 08/14/2018   Pressure injury of skin 08/14/2018   CAP (community acquired pneumonia) 08/13/2018   AKI (acute kidney injury) ([place]) 08/10/2017   Hyperkalemia 08/10/2017   Acute blood loss anemia 09/20/2016   Ischemic cardiomyopathy 09/20/2016   Chronic combined systolic and diastolic CHF (congestive heart failure) ([place]) 09/20/2016   CKD (chronic kidney disease), stage IV ([place]) 09/20/2016   History of breast cancer 08/02/2016   Primary osteoarthritis of both knees 08/02/2016   Primary osteoarthritis of both feet 08/02/2016   History of anemia 07/17/2016   History of CHF (congestive heart failure) 06/04/2016   History of chronic kidney disease 06/04/2016   Symptomatic anemia 06/04/2016   Elevated sedimentation rate 03/03/2016   Idiopathic chronic gout of multiple sites without tophus 03/03/2016   Total knee replacement status, right 03/03/2016   High risk medication use 03/03/2016   Chronic kidney disease (CKD) stage G3b/A1, moderately decreased glomerular filtration rate (GFR) between 30-44 mL/min/1.73 square meter and albuminuria creatinine ratio less than 30 mg/g ([place]) 02/01/2016   CAD S/P remote PCI- no details 09/02/2014   Cardiomyopathy, ischemic-EF 30-35% March 2015 48/54/6270   Diastolic dysfunction-grade 2 09/02/2014   CHF exacerbation ([place]) 08/28/2014   Acute on chronic combined systolic and diastolic congestive heart  failure ([place]) 08/28/2014   Iron deficiency anemia 08/20/2013   Malnutrition of moderate degree ([place]) 07/21/2013   PNA (pneumonia) 07/19/2013   HCAP (healthcare-associated pneumonia) 07/17/2013   Sepsis ([place]) 07/17/2013   ARF (acute renal failure) ([place]) 07/17/2013   Rheumatoid arthritis ([place]) 07/04/2013   Hypokalemia 07/03/2013   Chest pain 07/03/2013   Biventricular ICD in place (MDT 2014) 11/06/2012   Anemia of chronic disease    Breast cancer ([place])    Hypertension    Dyslipidemia 35/00/9381   Chronic systolic heart failure ([place]) 05/24/2009   Primary osteoarthritis of both hands 08/11/2007    Palliative Care Assessment & Plan   Patient Profile: Palliative Care consult requested for this 78 y.o. female with multiple medical problems including CAD, systolic heart failure status post AICD placement, CKD 3, chronic bronchitis, RA, who recently had an exploratory laparotomy with lysis of adhesions and bowel resection for ischemic bowel in May 2020.  She had a large open abdominal wound healing by secondary intention.  Patient was readmitted on 12/10/2018 with report of fecal material draining from wound.  Patient was found to have a fistula.  An Eakin's pouch was placed by surgery.  Patient was initially on TPN but that was eventually discontinued.  Oral intake is been markedly poor and patient has been persistently hyperglycemic.  Palliative care was consulted to help address goals.   Assessment/Recommendations/Plan  DNR/DNI-as confirmed by patient's husband (POA)  Will transition care to more comfort focus  Husband requesting residential hospice [facility]) (referral placed)  Robinul PRN for excessive secretions  Decrease IV fluids until discharge (husband aware)  D/C all interventions not comfort focused (cbg's labs etc)  Zofran PRN for nausea  Oxy IR/Roxanol PRN for pain  Liquifilm tears  for dry eyes  May have comfort food  PMT will continue to support  and follow   Goals of Care and Additional Recommendations:  Limitations on Scope of Treatment: Full Comfort Care and Initiate Comfort Feeding  Code Status:  DNR  Prognosis:   2-3 weeks in the setting of severe protein calorie malnutrition, poor po intake less than 10% over the past month, weight loss >10%, generalized weakness, stage III CKD, systolic heart failure, enterocutaneous fistula and inflammation around the gallbladder (not a surgical candidate), deconditioned, unstageable sacral ulceration, hypoglycemia due to poor po intake, anemia, CAD, hypokalemia, and hyponatremia.   Discharge Planning:  Hospice facility in [place] at husband's request.   Care plan was discussed with patient's husband Fuller Mandril and bedside RN.   Thank you for allowing the Palliative Medicine Team to assist in the care of this patient.  Time In: 1020 Time Out: 1125 Total Time: 65 min.   Greater than 50%  of this time was spent counseling and coordinating care related to the above assessment and plan.  Alda Lea, AGPCNP-BC Palliative Medicine Team  Phone: [phone] Fax: [phone] Pager: [phone] Amion: Delane Ginger. [place]

## 2019-01-01 ENCOUNTER — Telehealth: Payer: Self-pay | Admitting: Internal Medicine

## 2019-01-01 ENCOUNTER — Inpatient Hospital Stay (HOSPITAL_COMMUNITY): Payer: Medicare Other

## 2019-01-01 ENCOUNTER — Encounter (HOSPITAL_COMMUNITY): Payer: Self-pay | Admitting: Radiology

## 2019-01-01 DIAGNOSIS — Z66 Do not resuscitate: Secondary | ICD-10-CM

## 2019-01-01 HISTORY — PX: IR REMOVAL TUN CV CATH W/O FL: IMG2289

## 2019-01-01 LAB — GLUCOSE, CAPILLARY
Glucose-Capillary: 86 mg/dL (ref 70–99)
Glucose-Capillary: 85 mg/dL (ref 70–99)
Glucose-Capillary: 76 mg/dL (ref 70–99)

## 2019-01-01 MED ORDER — OXYCODONE HCL 5 MG PO TABS: 5.0000 mg | ORAL_TABLET | Freq: Four times a day (QID) | ORAL | 0 refills | Status: AC | PRN

## 2019-01-01 MED ORDER — GLYCOPYRROLATE 1 MG PO TABS: 1.0000 mg | ORAL_TABLET | ORAL | 0 refills | Status: AC | PRN

## 2019-01-01 MED ORDER — ONDANSETRON HCL 4 MG PO TABS: 4.0000 mg | ORAL_TABLET | Freq: Four times a day (QID) | ORAL | 0 refills | Status: AC | PRN

## 2019-01-01 NOTE — Progress Notes (Signed)
PALLIATIVE NOTE:  Patient resting in bed. Appetite remains poor. No family at the bedside. Does not respond as much today with verbal stimuli. Referral placed to [facility] at husband's request. She has been accepted for transfer today.   Husband aware of transfer and approval. Reviewed hospice home goals and care. He verbalized agreement and appreciation of care/support.   All questions answered.   Assessment -NAD, awake, alert, chronically-ill, cachectic  -RRR, diminished bases  Plan -DNR/DNI -Transferring to [facility] hospice home at husband's request  Total Time: 25 min.   Greater than 50%  of this time was spent counseling and coordinating care related to the above assessment and plan.  Alda Lea, AGPCNP-BC Palliative Medicine Team

## 2019-01-01 NOTE — Progress Notes (Signed)
Discharge via PTAR with paperworks, report given by Anderson Malta RN to hospice of [facility]

## 2019-01-01 NOTE — TOC Progression Note (Addendum)
Transition of Care [facility]) - Progression Note    Patient Details  Name: [name] MRN: [id number] Date of Birth: 07-13-1940  Transition of Care [facility]) CM/SW Contact  Jacalyn Lefevre Edson Snowball, RN Phone Number: 01/01/2019, 10:46 AM  Clinical Narrative:    69 Rt IJ discontinued, PTAR called. Called patient's husband  [name] at is [phone] to leave a message but his voicemail box is not set up.  Cassandra with [facility] has offered a bed at their [place].   Cassandra requesting mid line and IJ to be removed prior to transfer to them. Sent message to MD. Vito Backers requesting 2 Eakin pouches be sent with patient. Bedside nurse aware.  Number for bedside nurse to call report to [phone]   Fuller Mandril , patient's husband in agreement for transfer today.   Once IJ and midline removed, eakin pouches available and discharge complete will arrange transport.   Bedside nurse called report to [facility]. They are requesting defib to be turned off. MD calling EP.   Cassandra with [facility] prefers defib to be turned off, but will receive patient today either way. EP has been called. PTAR set up for 1800.  Jennifer bedside nurse will call report number again to let hospice know if it was indeed turned off or not     Barriers to Discharge: Continued Medical Work up  Expected Discharge Plan and Services         Living arrangements for the past 2 months: Single Family Home                                       Social Determinants of Health (SDOH) Interventions    Readmission Risk Interventions Readmission Risk Prevention Plan 09/17/2018 08/14/2018  Transportation Screening - Complete  Palliative Care Screening Complete -  Some recent data might be hidden

## 2019-01-01 NOTE — Progress Notes (Signed)
[place] and gave report to [employer]. Will continue to monitor.

## 2019-01-01 NOTE — Discharge Summary (Signed)
Physician Discharge Summary  [name]:[id number] DOB: [birth year] DOA: 12/10/2018  PCP: [name], MD  Admit date: 12/10/2018 Discharge date: 01/01/2019  Disposition: [facility] at hospice house  Discharge Condition: Guarded CODE STATUS: DNR Diet recommendation: As tolerated  Brief/Interim Summary: MaryMotleyis a76 y.o.female,with history of anemia of chronic disease, ASCVD, AICD placement, history of breast cancer, chronic bronchitis, stage III CKD, systolic heart failure, hyperlipidemia, GERD, gout, hypertension, rheumatoid arthritis who recently had expiratory laparotomy with lysis of adhesions, bowel resection for ischemic bowel in May 2020. She had large abdominal wound,which is healing by secondary intention. As per family they noted feces coming from the wound. No abdominal pain or fever. Patient was brought to the hospital for further evaluation. Patient is a poor historian. History obtained from ED records. CT of the abdomen pelvis was done, no results are available in epic due to technical issues. ED physician spoke to radiologist verbally about the results. As per ED physician, there is an enterocutaneous fistula and inflammation around the gallbladder. No acute cholecystitis. General surgery, Dr. Rosendo Gros was consulted, he recommended no immediate surgery at this time. Recommended patient to transfer to [facility] and surgery have been consulted again and have seen patient this morning. The patient now has an Eakins pouch, wound care, and TPN. Cental venous catheter has been placed.  Lynann Beaver pouch has been placed and has had low output. If she continues to have low output, and can be weaned off of TPN to an oral diet, the patient may be discharged soon. However, currently she is only ingesting about 15% of her meals. Calorie count in place. Surgery has stopped TPN as of 12/18/2018. Patient has been hypoglycemic. D5 NS at 75 cc/hr has been started. This  morning her hemoglobin is down to 6.4. She is being transfused with 2 units of PRBC's. She has been evaluated by SLP. She has been placed on a dysphagia 2 diet with fine chopped meats and this liquids by cup or straw. Hypoglycemia: Ongoing issue.  Previously on D51/2 NS   Palliative care is involved and after further discussion with family patient and family have agreed to hospice house as above.  Otherwise stable for discharge.  Discharge Diagnoses:  Active Problems:   AKI (acute kidney injury) ([place])   Unstageable pressure ulcer of sacral region [facility])   Enterocutaneous fistula   Palliative care encounter   Protein-calorie malnutrition [facility])   Advanced care planning/counseling discussion    Discharge Instructions   Allergies as of 01/01/2019      Reactions   Macrobid [nitrofurantoin Macrocrystal] Hives, Itching      Medication List    STOP taking these medications   acetaminophen 325 MG tablet Commonly known as: TYLENOL   acetaminophen 650 MG suppository Commonly known as: TYLENOL   albuterol (2.5 MG/3ML) 0.083% nebulizer solution Commonly known as: PROVENTIL   amiodarone 200 MG tablet Commonly known as: PACERONE   carvedilol 3.125 MG tablet Commonly known as: COREG   cephALEXin 500 MG capsule Commonly known as: KEFLEX   chlorhexidine 0.12 % solution Commonly known as: PERIDEX   enoxaparin 40 MG/0.4ML injection Commonly known as: LOVENOX   feeding supplement (OSMOLITE 1.5 CAL) Liqd   feeding supplement (PRO-STAT SUGAR FREE 64) Liqd   insulin aspart 100 UNIT/ML injection Commonly known as: novoLOG   mirtazapine 15 MG tablet Commonly known as: REMERON   mouth rinse Liqd solution   mupirocin ointment 2 % Commonly known as: BACTROBAN   ondansetron 4 MG disintegrating  tablet Commonly known as: ZOFRAN-ODT   ondansetron 4 MG/2ML Soln injection Commonly known as: ZOFRAN Replaced by: ondansetron 4 MG tablet   pantoprazole 40 MG tablet Commonly known as:  PROTONIX   potassium chloride SA 20 MEQ tablet Commonly known as: K-DUR   saccharomyces boulardii 250 MG capsule Commonly known as: FLORASTOR     TAKE these medications   glycopyrrolate 1 MG tablet Commonly known as: ROBINUL Take 1 tablet (1 mg total) by mouth every 4 (four) hours as needed for up to 10 days (excessive secretions).   ondansetron 4 MG tablet Commonly known as: ZOFRAN Take 1 tablet (4 mg total) by mouth every 6 (six) hours as needed for nausea. Replaces: ondansetron 4 MG/2ML Soln injection   oxyCODONE 5 MG immediate release tablet Commonly known as: Oxy IR/ROXICODONE Take 1 tablet (5 mg total) by mouth every 6 (six) hours as needed for moderate pain or severe pain (5mg  for moderate pain, 10mg  for severe pain). What changed:   how much to take  when to take this      Follow-up Information    Jovita Kussmaul, MD Follow up on 01/01/2019.   Specialty: General Surgery Why: Her appt is 9/10 arrive at 1:50 for 2:20 appt. She will have to have a family member or nurse from facility come and stay during appt with her if she is coming from facility. Contact information: [address] [place] [address] [phone]          Allergies  Allergen Reactions  . Macrobid [Nitrofurantoin Macrocrystal] Hives and Itching    Procedures/Studies: Ct Abdomen Pelvis Wo Contrast  Result Date: 12/10/2018 CLINICAL DATA:  78 year old who underwent bowel resection for ischemia in April, 2020, presenting now with possible wound infection as there is drainage from the INFERIOR aspect of the incision. Intravenous contrast was not administered due to the patient's renal insufficiency with creatinine of 1.89 and estimated GFR of 29. EXAM: CT ABDOMEN AND PELVIS WITHOUT CONTRAST TECHNIQUE: Multidetector CT imaging of the abdomen and pelvis was performed following the standard protocol without IV contrast. COMPARISON:  09/09/2018 and earlier. FINDINGS: Lower chest:  Respiratory motion blurred images of the lung bases. Mild scarring in the lower lobes. Visualized lung bases otherwise clear. Heart markedly enlarged. Biventricular pacemaker with the lead tips in the RIGHT atrial appendage, RV apex and coronary vein. Hepatobiliary: Normal unenhanced appearance of the liver. Multiple small gallstones in the mildly distended gallbladder, with gallstones also present in what I believe is the cystic duct. No pericholecystic edema/inflammation. No biliary ductal dilation. Pancreas: Mildly atrophic without evidence of mass or peripancreatic inflammation. Spleen: Normal unenhanced appearance. Adrenals/Urinary Tract: Normal appearing adrenal glands. Benign cortical cyst arising from the LOWER pole of the RIGHT kidney. Within the limits of the unenhanced technique, no significant focal parenchymal abnormality involving either kidney. No hydronephrosis. No opaque urinary tract calculi. Normal appearing relatively decompressed urinary bladder. Stomach/Bowel: Normal appearing gas-filled stomach. Clumped small-bowel loops low in the ANTERIOR pelvis, with a fistulous tract of gas extending from a small bowel loop to the skin at the INFERIOR margin of the incisional wound. Small bowel otherwise normal in appearance. Entire colon relatively decompressed. Sigmoid colon diverticulosis without evidence of acute diverticulitis. Vascular/Lymphatic: Severe aorto-iliofemoral atherosclerosis without evidence of aneurysm. Visceral artery atherosclerosis. No pathologic lymphadenopathy. Reproductive: Normal appearing uterus. Partially calcified LEFT adnexal mass as noted on the recent prior CTs. Other: None. Musculoskeletal: Osseous demineralization. Facet degenerative changes throughout the lumbar spine. No acute findings.  IMPRESSION: 1. Fistulous communication between a small bowel loop in the LOWER pelvis anteriorly and the skin of the LOWER portion of the incisional wound. 2. Cholelithiasis. Gallstones  in what I believe is the cystic duct with mild gallbladder distension, query cystic duct obstruction. 3. Partially calcified LEFT adnexal mass, identified on multiple recent CTs dating back to April, 2020 and unchanged. 4.  Aortic Atherosclerosis (ICD10-170.0) Electronically Signed   By: Evangeline Dakin M.D.   On: 12/10/2018 22:20   US Abdomen Complete  Result Date: 12/11/2018 CLINICAL DATA:  78 year old female with abdominal pain. Cholelithiasis and choledocholithiasis on recent CT. EXAM: ABDOMEN ULTRASOUND COMPLETE COMPARISON:  12/11/2018 CT FINDINGS: Gallbladder: Cholelithiasis identified, the largest measuring 6 mm. The gallbladder is distended with sludge. No gallbladder wall thickening, sonographic Murphy sign or pericholecystic fluid. Common bile duct: Diameter: 6 mm. No intrahepatic or extrahepatic biliary dilatation. Known choledocholithiasis not well visualized on this exam. Liver: No focal hepatic abnormalities are noted. Slightly heterogeneous hepatic echotexture identified. Portal vein is patent on color Doppler imaging with normal direction of blood flow towards the liver. IVC: No abnormality visualized. Pancreas: Visualized portion unremarkable. Spleen: Size and appearance within normal limits. Right Kidney: Length: 8.7 cm. A 2.3 cm LOWER pole cyst is present. Echogenicity within normal limits. No solid mass or hydronephrosis visualized. Left Kidney: Length: 9.2 cm. Echogenicity within normal limits. No mass or hydronephrosis visualized. Abdominal aorta: Aortic atherosclerotic plaque noted. No evidence of aortic aneurysm. Other findings: None. IMPRESSION: 1. Distended gallbladder with cholelithiasis and sludge. No evidence of gallbladder wall thickening or sonographic Murphy sign to suggest acute cholecystitis at this time. 2. No biliary dilatation. Known choledocholithiasis not well visualized on this examination. 3.  Aortic Atherosclerosis (ICD10-I70.0). Electronically Signed   By: Margarette Canada  M.D.   On: 12/11/2018 13:02   Ir Fluoro Guide Cv Line Right  Addendum Date: 12/13/2018   ADDENDUM REPORT: 12/13/2018 08:27 ADDENDUM: The impression should read: Placement of tunneled central venous catheter. Electronically Signed   By: Aletta Edouard M.D.   On: 12/13/2018 08:27   Result Date: 12/13/2018 CLINICAL DATA:  Enterocutaneous fistula and need for central venous catheter for parental nutrition. EXAM: TUNNELED CENTRAL VENOUS CATHETER PLACEMENT WITH ULTRASOUND AND FLUOROSCOPIC GUIDANCE FLUOROSCOPY TIME:  36 seconds.  1.0 mGy. PROCEDURE: The procedure, risks, benefits, and alternatives were explained to the patient. Questions regarding the procedure were encouraged and answered. The patient understands and consents to the procedure. A time-out was performed prior to initiating the procedure. Ultrasound was used to confirm patency of the right internal jugular vein. The right neck and chest were prepped with chlorhexidine in a sterile fashion, and a sterile drape was applied covering the operative field. Maximum barrier sterile technique with sterile gowns and gloves were used for the procedure. Local anesthesia was provided with 1% lidocaine. After creating a small venotomy incision, a 21 gauge needle was advanced into the right internal jugular vein under direct, real-time ultrasound guidance. Ultrasound image documentation was performed. After securing guidewire access a peel-away sheath was placed over a guide wire. Appropriate catheter length was estimated by guidewire. A 6 French dual-lumen power line was tunneled from the right upper chest wall to the venotomy incision. The catheter was cut to 23 cm based on guidewire measurement. The catheter was then placed through the sheath and the sheath removed. Final catheter positioning was confirmed and documented with a fluoroscopic spot image. The catheter was aspirated and flushed with saline. The catheter exit site was secured with  Ethilon and Prolene  retention sutures. The venotomy incision was closed with subcutaneous 4-0 Vicryl and Dermabond. COMPLICATIONS: None.  No pneumothorax. FINDINGS: After catheter placement, the tip lies at the cavoatrial junction. The catheter aspirates normally and is ready for immediate use. IMPRESSION: Placement of non-tunneled central venous catheter via the right internal jugular vein. The catheter tip lies at the cavoatrial junction. The catheter is ready for immediate use. Electronically Signed: By: Aletta Edouard M.D. On: 12/13/2018 08:00   Ir US Guide Vasc Access Right  Addendum Date: 12/13/2018   ADDENDUM REPORT: 12/13/2018 08:27 ADDENDUM: The impression should read: Placement of tunneled central venous catheter. Electronically Signed   By: Aletta Edouard M.D.   On: 12/13/2018 08:27   Result Date: 12/13/2018 CLINICAL DATA:  Enterocutaneous fistula and need for central venous catheter for parental nutrition. EXAM: TUNNELED CENTRAL VENOUS CATHETER PLACEMENT WITH ULTRASOUND AND FLUOROSCOPIC GUIDANCE FLUOROSCOPY TIME:  36 seconds.  1.0 mGy. PROCEDURE: The procedure, risks, benefits, and alternatives were explained to the patient. Questions regarding the procedure were encouraged and answered. The patient understands and consents to the procedure. A time-out was performed prior to initiating the procedure. Ultrasound was used to confirm patency of the right internal jugular vein. The right neck and chest were prepped with chlorhexidine in a sterile fashion, and a sterile drape was applied covering the operative field. Maximum barrier sterile technique with sterile gowns and gloves were used for the procedure. Local anesthesia was provided with 1% lidocaine. After creating a small venotomy incision, a 21 gauge needle was advanced into the right internal jugular vein under direct, real-time ultrasound guidance. Ultrasound image documentation was performed. After securing guidewire access a peel-away sheath was placed over a  guide wire. Appropriate catheter length was estimated by guidewire. A 6 French dual-lumen power line was tunneled from the right upper chest wall to the venotomy incision. The catheter was cut to 23 cm based on guidewire measurement. The catheter was then placed through the sheath and the sheath removed. Final catheter positioning was confirmed and documented with a fluoroscopic spot image. The catheter was aspirated and flushed with saline. The catheter exit site was secured with Ethilon and Prolene retention sutures. The venotomy incision was closed with subcutaneous 4-0 Vicryl and Dermabond. COMPLICATIONS: None.  No pneumothorax. FINDINGS: After catheter placement, the tip lies at the cavoatrial junction. The catheter aspirates normally and is ready for immediate use. IMPRESSION: Placement of non-tunneled central venous catheter via the right internal jugular vein. The catheter tip lies at the cavoatrial junction. The catheter is ready for immediate use. Electronically Signed: By: Aletta Edouard M.D. On: 12/13/2018 08:00   Dg Swallowing Func-speech Pathology  Result Date: 12/18/2018 Objective Swallowing Evaluation: Type of Study: MBS-Modified Barium Swallow Study  Patient Details Name: KAWANDA DRUMHELLER MRN: [id number] Date of Birth: July 12, 1940 Today's Date: 12/18/2018 Time: SLP Start Time (ACUTE ONLY): 1330 -SLP Stop Time (ACUTE ONLY): 1400 SLP Time Calculation (min) (ACUTE ONLY): 30 min Past Medical History: Past Medical History: Diagnosis Date . Anemia   Hgb of 9-10 . Anemia of chronic disease   Hgb of 9-10 chronically; 06/2010: H&H-10.7/33.5, MCV-81, normal iron studies in 2010  . Arteriosclerotic cardiovascular disease (ASCVD)   Remote PTCA by patient report; LBBB; associated cardiomyopathy, presumed ischemic with EF 40-45% previously, 20% in 06/2009; h/o clinical congestive heart failure; negative stress nuclear in 2009 with inferoseptal and apical scar . Automatic implantable cardioverter-defibrillator in situ   . Breast cancer (HCC)   breast . Chronic  bronchitis ([place])  . Chronic renal disease, stage 3, moderately decreased glomerular filtration rate (GFR) between 30-59 mL/min/1.73 square meter (HCC) 02/01/2016 . Congestive heart failure (CHF) ([place])  . Elevated cholesterol  . Elevated sed rate  . GERD (gastroesophageal reflux disease)  . GI bleed  . Gout  . HOH (hard of hearing)  . Hyperlipidemia  . Hypertension  . Rheumatoid arthritis ([place])  . UTI (urinary tract infection)  Past Surgical History: Past Surgical History: Procedure Laterality Date . BI-VENTRICULAR IMPLANTABLE CARDIOVERTER DEFIBRILLATOR N/A 07/28/2012  Procedure: BI-VENTRICULAR IMPLANTABLE CARDIOVERTER DEFIBRILLATOR  (CRT-D);  Surgeon: Evans Lance, MD;  Location: [facility] CATH LAB;  Service: Cardiovascular;  Laterality: N/A; . BI-VENTRICULAR IMPLANTABLE CARDIOVERTER DEFIBRILLATOR  (CRT-D)  07/28/2012 . BOWEL RESECTION N/A 08/21/2018  Procedure: Small Bowel Resection;  Surgeon: Jovita Kussmaul, MD;  Location: [place];  Service: General;  Laterality: N/A; . BREAST BIOPSY Bilateral  . CATARACT EXTRACTION W/ INTRAOCULAR LENS IMPLANT Left  . CATARACT EXTRACTION W/PHACO Right 09/15/2013  Procedure: CATARACT EXTRACTION PHACO AND INTRAOCULAR LENS PLACEMENT (IOC);  Surgeon: Elta Guadeloupe T. Gershon Crane, MD;  Location: [facility] ORS;  Service: Ophthalmology;  Laterality: Right;  CDE:  10.74 . COLONOSCOPY N/A 03/04/2015  Procedure: COLONOSCOPY;  Surgeon: Rogene [place], MD;  Location: [facility] ENDO SUITE;  Service: Endoscopy;  Laterality: N/A;  10:50  . ESOPHAGOGASTRODUODENOSCOPY N/A 03/04/2015  Procedure: ESOPHAGOGASTRODUODENOSCOPY (EGD);  Surgeon: Rogene [place], MD;  Location[facility] END[facility];  Service: Endoscopy;  Laterality: N/A; . ESOPHAGOGASTRODUODENOSCOPY N/A 09/21/2016  Procedure: ESOPHAGOGASTRODUODENOSCOPY (EGD);  Surgeon: Rogene [place], MD;  [facility];  Service: Endoscopy;  Laterality: N/A; . GIVENS CAPSULE STUDY N/A 09/22/2016  Procedure: GIVENS CAPSULE STUDY;  Surgeon: Rogene [place], MD;  [facility];  Service: Endoscopy;  Laterality: N/A; . IR FLUORO GUIDE CV LINE RIGHT  12/12/2018 . IR US GUIDE VASC ACCESS RIGHT  12/12/2018 . LAPAROTOMY N/A 08/21/2018  Procedure: EXPLORATORY LAPAROTOMY WITH LYSIS OF ADHESIONS;  Surgeon: Jovita Kussmaul, MD;  Location: [place];  Service: General;  Laterality: N/A; . MASTECTOMY Left 1998 . TENDON TRANSFER Right 08/15/2017  Procedure: TENDON TRANSFER RIGHT WRIST EXTENSORS AS NEEDED, ULNA RESECTION AND STABILIZATION;  Surgeon: Charlotte Crumb, MD;  Location: [place];  Service: Orthopedics;  Laterality: Right; . TOTAL KNEE ARTHROPLASTY Right   Dr.Harrison . TUBAL LIGATION   HPI: Pt is a 78 y.o. female, with history of anemia of chronic disease, ASCVD, AICD placement, history of breast cancer, chronic bronchitis, stage III CKD, systolic heart failure, hyperlipidemia, GERD, gout, hypertension, rheumatoid arthritis who recently had expiratory laparotomy with lysis of adhesions, bowel resection for ischemic bowel in May 2020.  She had large abdominal wound, which is healing by secondary intention.  As per family they noted feces coming from the wound; s/p eakin pouch 8/21. Pt was followed by SLP during recent admission with recommedation for pureed diet, thin liquids, but pt had very little intake.  Assessment / Plan / Recommendation   Pt presents with mild dysphagia, primarily due to oral phase deficits. Due to impaired mastication, pt expectorated regular texture solid. Pt demonstrated decreased bolus cohesion, prolonged transit, and min oral residue of dysphagia 3 soft solids. Also due to decreased bolus cohesion, pt was unable to coordinate deglutition of a barium pill with thin barium; pill was expectorated. Pt's pharyngeal swallow function was [facility] across solid and liquid PO trials. When consuming thin liquids, pt initiated the swallow at the level of the pyriforms. There was one instance of shallow  penetration of thin into laryngeal vestibule,  which remained above the vocal folds and was ejected during the swallow. Recommend pt initiate dysphagia 2 (minced/ground) diet with thin liquids, meds crushed in puree, and full supervision to assist with use of swallow strategies. Pt would benefit from continued skilled intervention for dysphagia to ensure diet safety and efficiency while inpatient. CHL IP CLINICAL IMPRESSIONS 12/18/2018 Clinical Impression -- SLP Visit Diagnosis Dysphagia, oropharyngeal phase (R13.12) Attention and concentration deficit following -- Frontal lobe and executive function deficit following -- Impact on safety and function Mild aspiration risk   CHL IP TREATMENT RECOMMENDATION 12/18/2018 Treatment Recommendations Therapy as outlined in treatment plan below   Prognosis 12/18/2018 Prognosis for Safe Diet Advancement Good Barriers to Reach Goals -- Barriers/Prognosis Comment -- CHL IP DIET RECOMMENDATION 12/18/2018 SLP Diet Recommendations Dysphagia 2 (Fine chop) solids;Thin liquid Liquid Administration via Cup;Straw Medication Administration Crushed with puree Compensations Minimize environmental distractions;Slow rate;Small sips/bites Postural Changes Seated upright at 90 degrees   CHL IP OTHER RECOMMENDATIONS 12/18/2018 Recommended Consults -- Oral Care Recommendations Oral care BID Other Recommendations --   CHL IP FOLLOW UP RECOMMENDATIONS 12/18/2018 Follow up Recommendations 24 hour supervision/assistance   CHL IP FREQUENCY AND DURATION 12/18/2018 Speech Therapy Frequency (ACUTE ONLY) min 2x/week Treatment Duration 2 weeks      CHL IP ORAL PHASE 12/18/2018 Oral Phase Impaired Oral - Pudding Teaspoon -- Oral - Pudding Cup -- Oral - Honey Teaspoon -- Oral - Honey Cup -- Oral - Nectar Teaspoon -- Oral - Nectar Cup -- Oral - Nectar Straw -- Oral - Thin Teaspoon -- Oral - Thin Cup WFL Oral - Thin Straw WFL Oral - Puree -- Oral - Mech Soft Piecemeal swallowing;Impaired mastication;Decreased bolus cohesion Oral - Regular -- Oral -  Multi-Consistency Piecemeal swallowing;Decreased bolus cohesion Oral - Pill -- Oral Phase - Comment --  CHL IP PHARYNGEAL PHASE 12/18/2018 Pharyngeal Phase Impaired Pharyngeal- Pudding Teaspoon -- Pharyngeal -- Pharyngeal- Pudding Cup -- Pharyngeal -- Pharyngeal- Honey Teaspoon -- Pharyngeal -- Pharyngeal- Honey Cup -- Pharyngeal -- Pharyngeal- Nectar Teaspoon -- Pharyngeal -- Pharyngeal- Nectar Cup -- Pharyngeal -- Pharyngeal- Nectar Straw -- Pharyngeal -- Pharyngeal- Thin Teaspoon -- Pharyngeal -- Pharyngeal- Thin Cup Delayed swallow initiation-pyriform sinuses;Penetration/Aspiration before swallow Pharyngeal Material enters airway, remains ABOVE vocal cords then ejected out Pharyngeal- Thin Straw WFL Pharyngeal -- Pharyngeal- Puree -- Pharyngeal -- Pharyngeal- Mechanical Soft Delayed swallow initiation-vallecula;Pharyngeal residue - valleculae Pharyngeal -- Pharyngeal- Regular (No Data) Pharyngeal -- Pharyngeal- Multi-consistency (No Data) Pharyngeal -- Pharyngeal- Pill -- Pharyngeal -- Pharyngeal Comment --  CHL IP CERVICAL ESOPHAGEAL PHASE 12/18/2018 Cervical Esophageal Phase WFL Pudding Teaspoon -- Pudding Cup -- Honey Teaspoon -- Honey Cup -- Nectar Teaspoon -- Nectar Cup -- Nectar Straw -- Thin Teaspoon -- Thin Cup -- Thin Straw -- Puree -- Mechanical Soft -- Regular -- Multi-consistency -- Pill -- Cervical Esophageal Comment -- Arbutus Leas 12/18/2018, 3:50 PM              [place] Ekg Site Rite  Result Date: 12/12/2018 If Site Rite image not attached, placement could not be confirmed due to current cardiac rhythm.  [place] Ekg Site Rite  Result Date: 12/12/2018 If [facility] image not attached, placement could not be confirmed due to current cardiac rhythm.   Subjective: No acute issues or events overnight   Discharge Exam: Vitals:   12/31/18 2240 01/01/19 0502  BP: (!) 102/54 (!) 115/56  Pulse: 82 81  Resp:  17  Temp: 99.3 F (37.4 C) 99.7  F (37.6 C)  SpO2: 100% 100%   Vitals:   12/31/18 0346  12/31/18 1908 12/31/18 2240 01/01/19 0502  BP: (!) 112/55 (!) 102/56 (!) 102/54 (!) 115/56  Pulse: 72 90 82 81  Resp:  17  17  Temp: 99 F (37.2 C) 99.7 F (37.6 C) 99.3 F (37.4 C) 99.7 F (37.6 C)  TempSrc: Axillary Oral Oral Oral  SpO2: 100% 99% 100% 100%  Weight:      Height:        General:  Pleasantly resting in bed, No acute distress. HEENT:  Normocephalic atraumatic.  Sclerae nonicteric, noninjected.  Extraocular movements intact bilaterally. Neck:  Without mass or deformity.  Trachea is midline. Lungs:  Clear to auscultate bilaterally without rhonchi, wheeze, or rales. Heart:  Regular rate and rhythm.  Without murmurs, rubs, or gallops. Abdomen:  Soft, nontender, nondistended.  Without guarding or rebound. Extremities: Without cyanosis, clubbing, edema, or obvious deformity. Vascular:  Dorsalis pedis and posterior tibial pulses palpable bilaterally. Skin:  Warm and dry, no erythema   The results of significant diagnostics from this hospitalization (including imaging, microbiology, ancillary and laboratory) are listed below for reference.     Microbiology: Recent Results (from the past 240 hour(s))  Novel Coronavirus, NAA (hospital order; send-out to ref lab)     Status: None   Collection Time: 12/29/18 10:53 AM   Specimen: Nasopharyngeal Swab; Respiratory  Result Value Ref Range Status   SARS-CoV-2, NAA NOT DETECTED NOT DETECTED Final    Comment: (NOTE) This nucleic acid amplification test was developed and its performance characteristics determined by [employer]. Nucleic acid amplification tests include PCR and TMA. This test has not been FDA cleared or approved. This test has been authorized by FDA under an Emergency Use Authorization (EUA). This test is only authorized for the duration of time the declaration that circumstances exist justifying the authorization of the emergency use of in vitro diagnostic tests for detection of SARS-CoV-2 virus  and/or diagnosis of COVID-19 infection under section 564(b)(1) of the Act, 21 U.S.C. 213YQM-5(H) (1), unless the authorization is terminated or revoked sooner. When diagnostic testing is negative, the possibility of a false negative result should be considered in the context of a patient's recent exposures and the presence of clinical signs and symptoms consistent with COVID-19. An individual without symptoms of COVID- 19 and who is not shedding SARS-CoV-2 vi rus would expect to have a negative (not detected) result in this assay. Performed At: [facility] [address] [place], [place] [id number] Rush Farmer MD WU:[id number]    [place]  Final    Comment: Performed at [facility], [place] [address], [place], [place] [address]     Labs:  BNP (last 3 results) Recent Labs    08/13/18 1926  BNP 440.3*   Basic Metabolic Panel: Recent Labs  Lab 12/26/18 0346 12/29/18 0431 12/31/18 0340  NA 133* 130* 128*  K 4.2 4.0 3.5  CL 103 98 95*  CO2 24 26 24   GLUCOSE 98 93 94  BUN 12 8 7*  CREATININE 1.22* 1.22* 1.14*  CALCIUM 7.8* 7.9* 7.8*   CBC: Recent Labs  Lab 12/26/18 0346 12/29/18 0431  WBC 5.6 7.2  NEUTROABS 3.2 4.2  HGB 9.2* 9.4*  HCT 28.9* 29.6*  MCV 89.8 88.4  PLT 206 249    CBG: Recent Labs  Lab 12/31/18 0106 12/31/18 0344 12/31/18 0814 12/31/18 1202 01/01/19 0751  GLUCAP 106* 82 94 109* 86   Urinalysis  Component Value Date/Time   COLORURINE AMBER (A) 11/12/2018 1911   APPEARANCEUR HAZY (A) 11/12/2018 1911   LABSPEC 1.016 11/12/2018 1911   PHURINE 5.0 11/12/2018 1911   GLUCOSEU NEGATIVE 11/12/2018 1911   HGBUR NEGATIVE 11/12/2018 1911   BILIRUBINUR NEGATIVE 11/12/2018 1911   KETONESUR NEGATIVE 11/12/2018 1911   PROTEINUR NEGATIVE 11/12/2018 1911   UROBILINOGEN 0.2 07/17/2013 2240   NITRITE NEGATIVE 11/12/2018 1911   LEUKOCYTESUR MODERATE (A) 11/12/2018 1911   Sepsis Labs Invalid input(s):  PROCALCITONIN,  WBC,  LACTICIDVEN Microbiology Recent Results (from the past 240 hour(s))  Novel Coronavirus, NAA (hospital order; send-out to ref lab)     Status: None   Collection Time: 12/29/18 10:53 AM   Specimen: Nasopharyngeal Swab; Respiratory  Result Value Ref Range Status   SARS-CoV-2, NAA NOT DETECTED NOT DETECTED Final    Comment: (NOTE) This nucleic acid amplification test was developed and its performance characteristics determined by [employer]. Nucleic acid amplification tests include PCR and TMA. This test has not been FDA cleared or approved. This test has been authorized by FDA under an Emergency Use Authorization (EUA). This test is only authorized for the duration of time the declaration that circumstances exist justifying the authorization of the emergency use of in vitro diagnostic tests for detection of SARS-CoV-2 virus and/or diagnosis of COVID-19 infection under section 564(b)(1) of the Act, 21 U.S.C. 248GNO-0(B) (1), unless the authorization is terminated or revoked sooner. When diagnostic testing is negative, the possibility of a false negative result should be considered in the context of a patient's recent exposures and the presence of clinical signs and symptoms consistent with COVID-19. An individual without symptoms of COVID- 19 and who is not shedding SARS-CoV-2 vi rus would expect to have a negative (not detected) result in this assay. Performed At: [facility] [address] [place], [place] [id number] Rush Farmer MD XI:[id number]    [place]  Final    Comment: Performed at [facility], [place] [address], [place], [place] [address]     Time coordinating discharge: Over 30 minutes  SIGNED:   Little Ishikawa, DO [facility] 01/01/2019, 11:43 AM Pager   If 7PM-7AM, please contact night-coverage [email or web address] Password [email or web address]

## 2019-01-01 NOTE — Progress Notes (Signed)
Routine line care: DL CVC site unremarkable. Purple lumen infusing. Midline removed to decrease risk of infection as it is not being used.

## 2019-01-01 NOTE — Telephone Encounter (Signed)
°  1. Has your device fired? No   2. Is you device beeping? No   3. Are you experiencing draining or swelling at device site? no  4. Are you calling to see if we received your device transmission? Nephew [name] states he received call about he [name]. About not receiving a remote transmission, he wants to let her know she is currently in the hospital.   5. Have you passed out? No     Please route to [place]

## 2019-01-01 NOTE — Procedures (Signed)
Successful removal of tunneled (R)IJ CVC No complications.   Ascencion Dike PA-C Interventional Radiology 01/01/2019 1:19 PM

## 2019-01-01 NOTE — Plan of Care (Signed)
  Problem: Nutrition: Goal: Adequate nutrition will be maintained Outcome: Progressing   Problem: Activity: Goal: Risk for activity intolerance will decrease Outcome: Progressing   Problem: Elimination: Goal: Will not experience complications related to bowel motility Outcome: Progressing Goal: Will not experience complications related to urinary retention Outcome: Progressing   Problem: Safety: Goal: Ability to remain free from injury will improve Outcome: Progressing   Problem: Skin Integrity: Goal: Risk for impaired skin integrity will decrease Outcome: Progressing

## 2019-01-02 ENCOUNTER — Encounter: Payer: Self-pay | Admitting: *Deleted

## 2019-01-02 NOTE — Telephone Encounter (Signed)
Per hospital dishcharge notes, patient was discharged to [place].

## 2019-01-02 NOTE — Consult Note (Signed)
   [facility] CM Inpatient Consult   01/02/2019  [name] 05/14/1940 [id number]   Follow up: Disposition  Patient to [place], [facility].  Sign off.  [name], RN BSN [name]  [phone] business mobile phone Toll free office [phone]  Fax number: [phone] [name]

## 2019-01-02 NOTE — Telephone Encounter (Signed)
Opened in error

## 2019-01-22 DIAGNOSIS — J9600 Acute respiratory failure, unspecified whether with hypoxia or hypercapnia: Secondary | ICD-10-CM | POA: Diagnosis not present

## 2019-01-27 NOTE — Telephone Encounter (Signed)
Spoke with Butch Penny at [facility]. Butch Penny confirms pt is still a resident there. No further action needed at this time.

## 2019-02-22 DIAGNOSIS — J9600 Acute respiratory failure, unspecified whether with hypoxia or hypercapnia: Secondary | ICD-10-CM | POA: Diagnosis not present

## 2019-03-24 DIAGNOSIS — J9600 Acute respiratory failure, unspecified whether with hypoxia or hypercapnia: Secondary | ICD-10-CM | POA: Diagnosis not present

## 2019-04-24 DIAGNOSIS — J9600 Acute respiratory failure, unspecified whether with hypoxia or hypercapnia: Secondary | ICD-10-CM | POA: Diagnosis not present

## 2019-04-25 DIAGNOSIS — I469 Cardiac arrest, cause unspecified: Secondary | ICD-10-CM | POA: Diagnosis not present

## 2019-04-25 DIAGNOSIS — R4020 Unspecified coma: Secondary | ICD-10-CM | POA: Diagnosis not present

## 2019-04-25 DIAGNOSIS — Z743 Need for continuous supervision: Secondary | ICD-10-CM | POA: Diagnosis not present

## 2019-04-25 DIAGNOSIS — R52 Pain, unspecified: Secondary | ICD-10-CM | POA: Diagnosis not present

## 2019-05-25 DIAGNOSIS — 419620001 Death: Secondary | SNOMED CT | POA: Diagnosis not present

## 2019-05-25 DEATH — deceased

## 2020-09-11 IMAGING — CR PORTABLE CHEST - 1 VIEW SAME DAY
1 series · 1 of 1 positions shown · non-contrast
Comparison: 08/13/2018

CLINICAL DATA: Is NG tube placement

EXAM:
PORTABLE CHEST 1 VIEW

[portable]
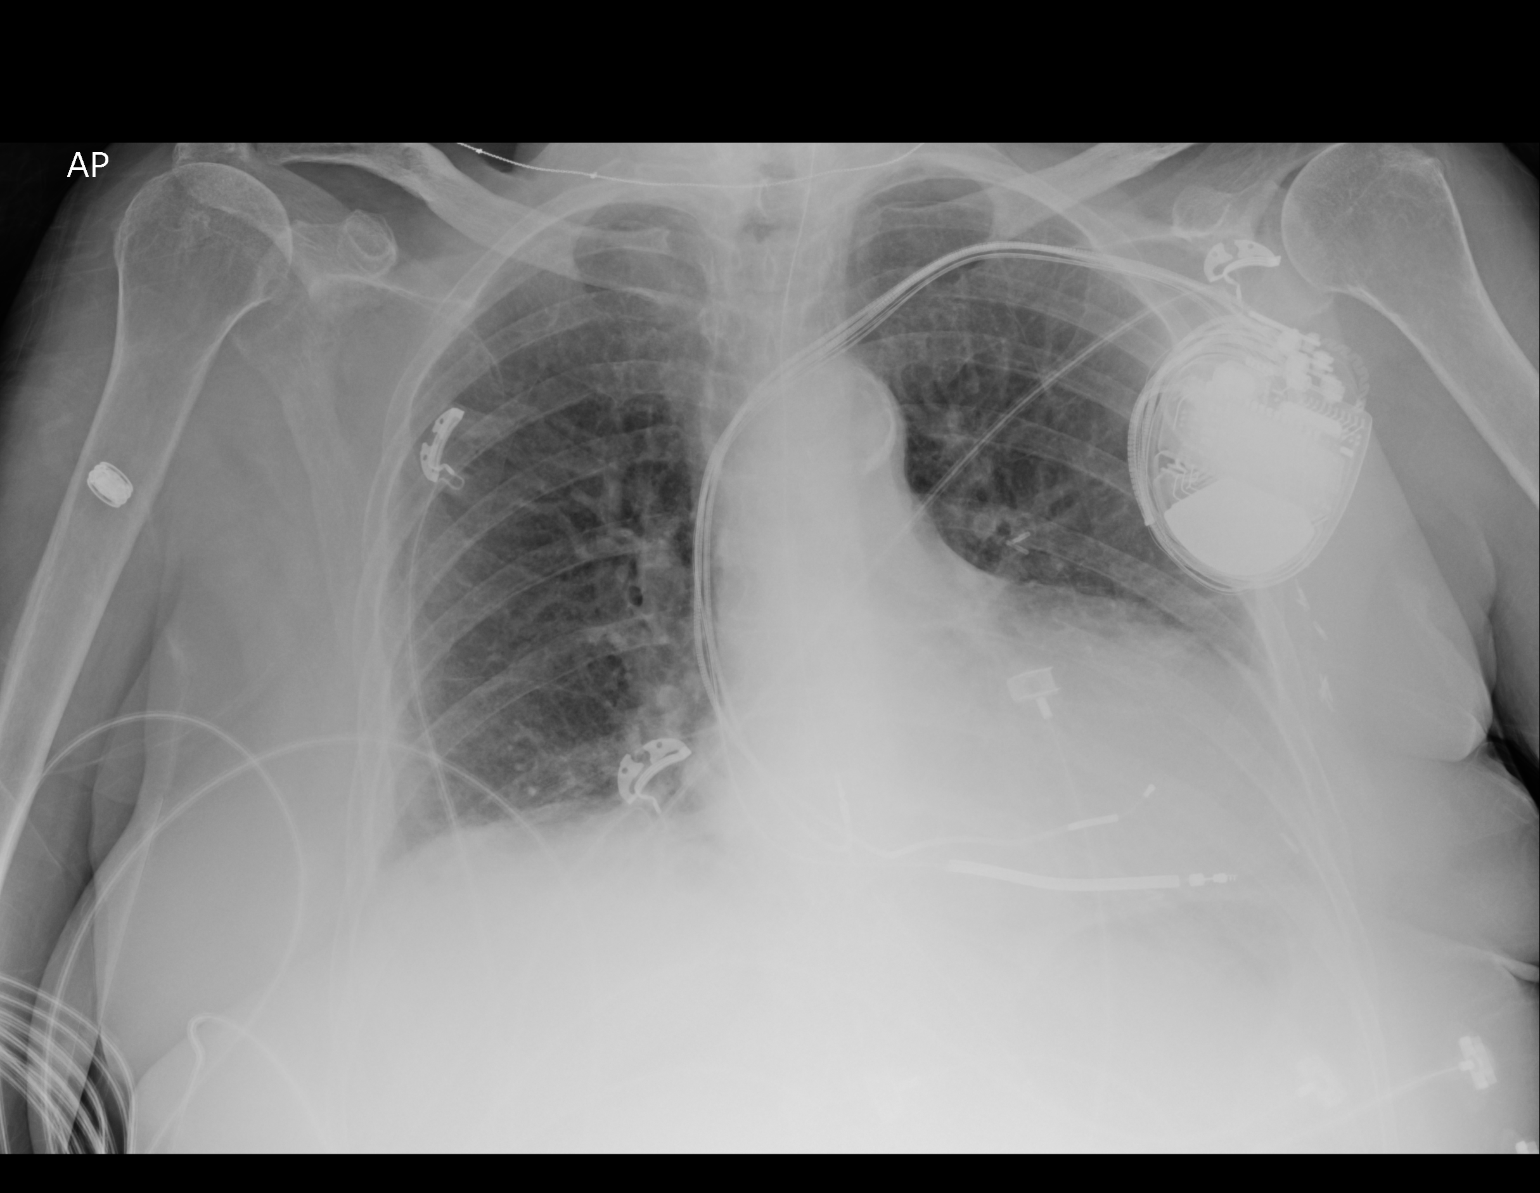

[1 of 1 positions shown; findings below may reference images not displayed]

FINDINGS: NG tube is in place with the tip in the proximal stomach. Left AICD
is unchanged. Cardiomegaly, bibasilar atelectasis. No overt edema or
visible effusions. No acute bony abnormality.
IMPRESSION: NG tube tip in the proximal stomach.

Cardiomegaly, bibasilar atelectasis.

## 2020-09-11 IMAGING — CR PORTABLE CHEST - 1 VIEW
2 series · 2 of 2 positions shown · non-contrast
Comparison: 12/13/2015

CLINICAL DATA: Abdominal pain

EXAM:
PORTABLE CHEST 1 VIEW

[portable (1 of 2)]
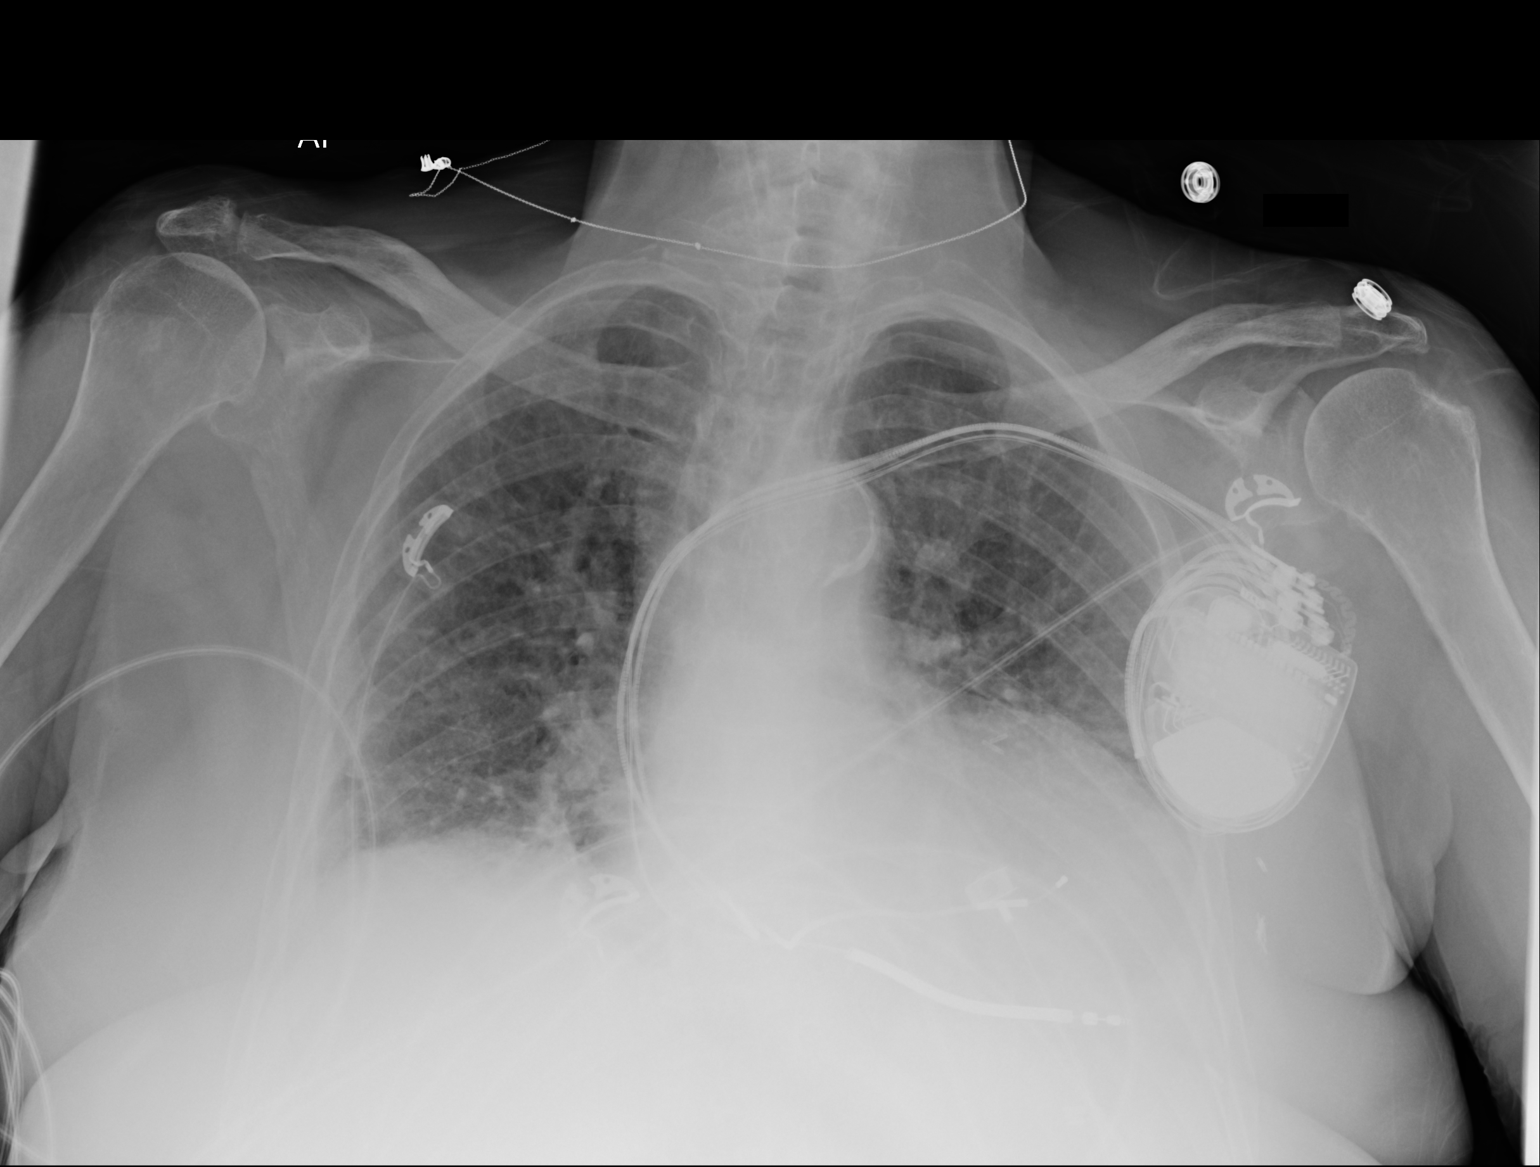

[portable (2 of 2)]
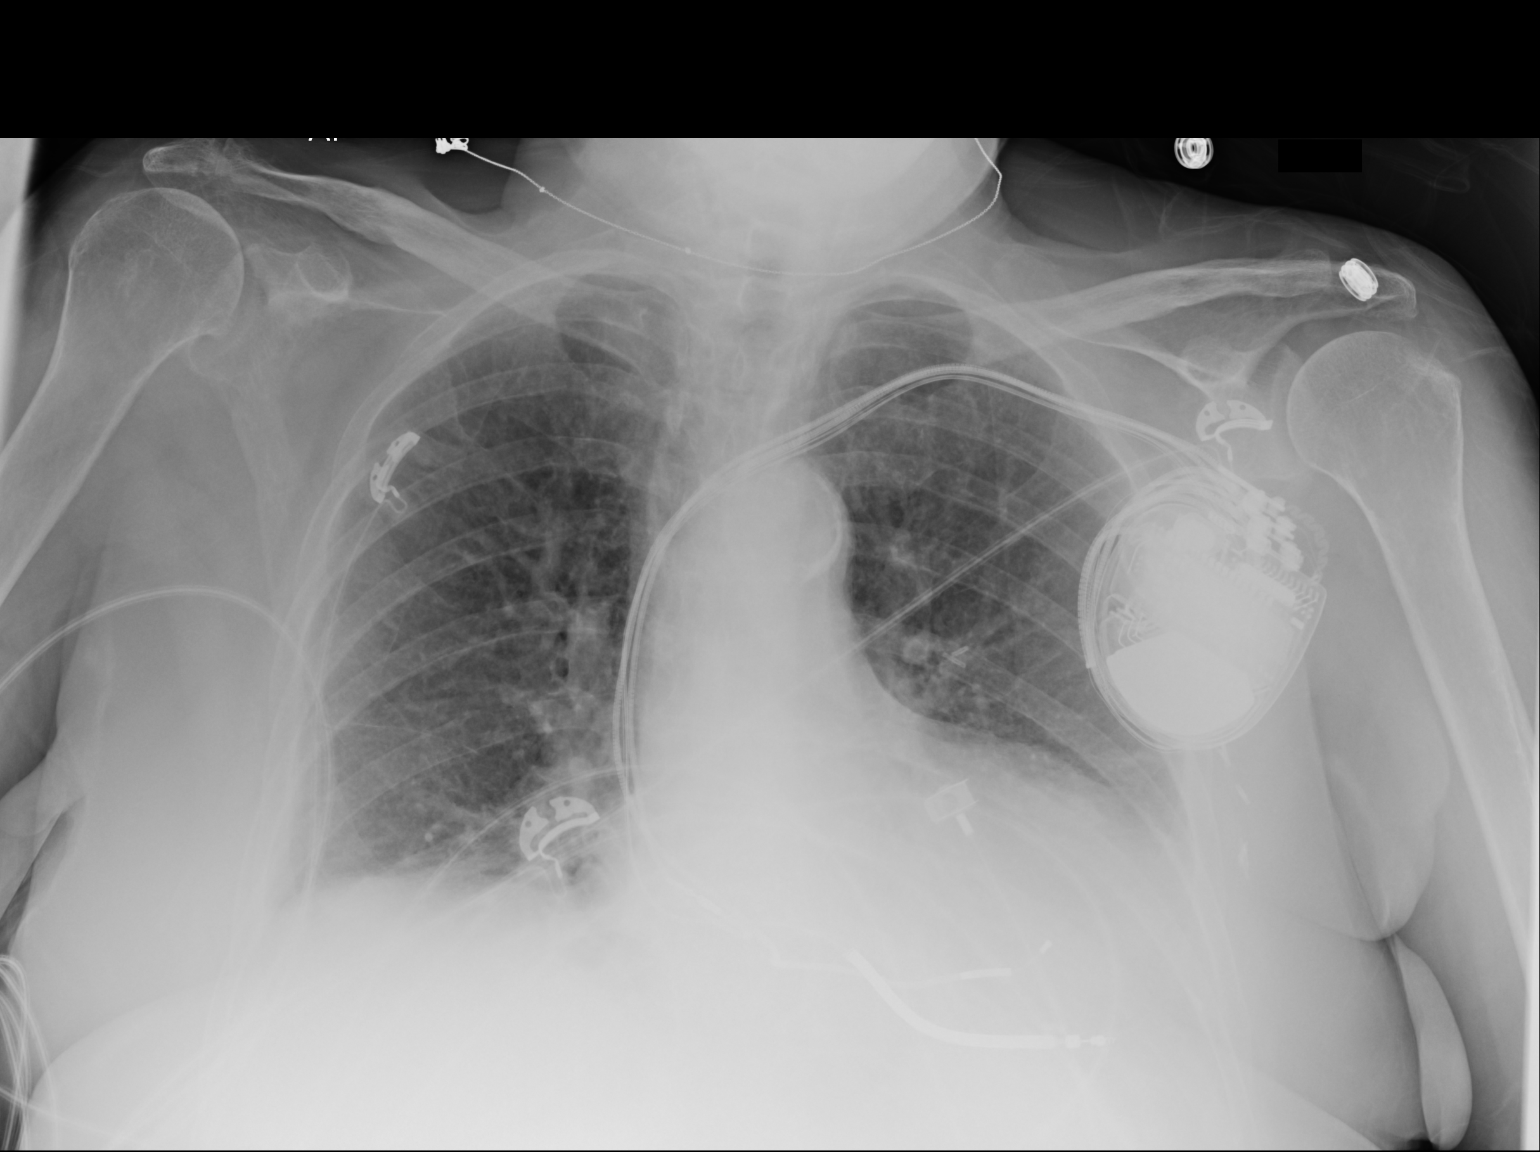

[2 of 2 positions shown; findings below may reference images not displayed]

FINDINGS: Left AICD remains in place, unchanged. Cardiomegaly with vascular
congestion. Suspect early interstitial edema. Low volumes with
bibasilar atelectasis and small effusions. No acute bony
abnormality.
IMPRESSION: Findings compatible with mild edema/CHF. Low volumes with bibasilar
atelectasis and small effusions.

## 2020-09-11 IMAGING — CT CT ABDOMEN AND PELVIS WITHOUT CONTRAST
2 of 4 series · 15 of 46 positions shown, 17 images · non-contrast
Comparison: Report 04/25/2001

CLINICAL DATA: Abdominal swelling poor appetite

EXAM:
CT ABDOMEN AND PELVIS WITHOUT CONTRAST
TECHNIQUE: Multidetector CT imaging of the abdomen and pelvis was performed
following the standard protocol without IV contrast.

[Series 3: axial st · axial · 0.67mm/px · z∈[-539,-109]mm · 12 of 94 slices shown, 14 images]
[im 4/94  soft-tissue]
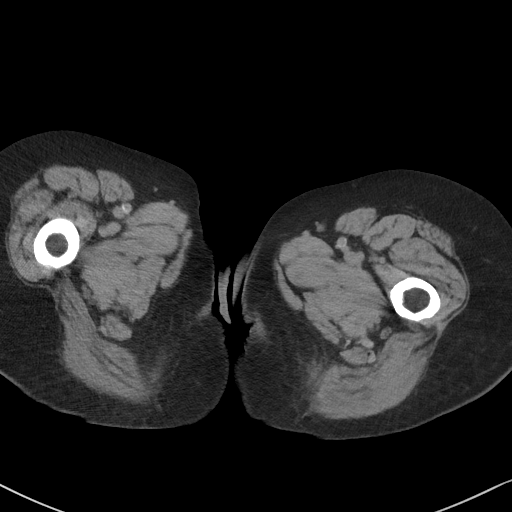
[im 4/94  bone]
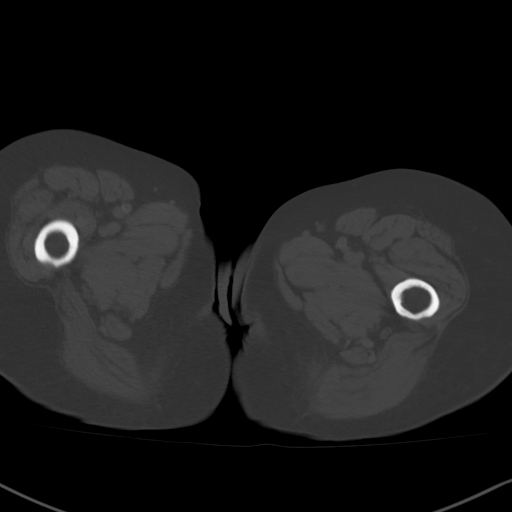
[im 12/94  soft-tissue]
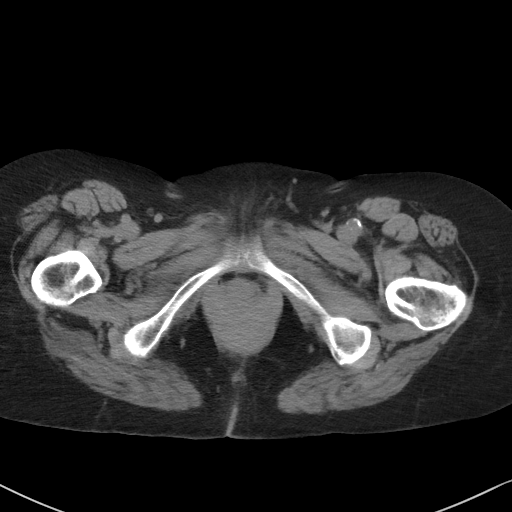
[im 20/94  soft-tissue]
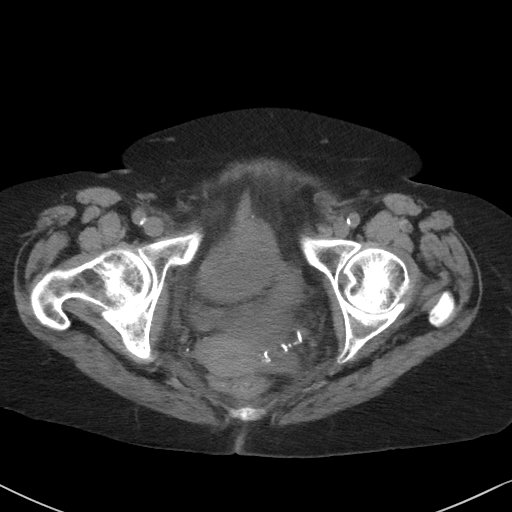
[im 28/94  soft-tissue]
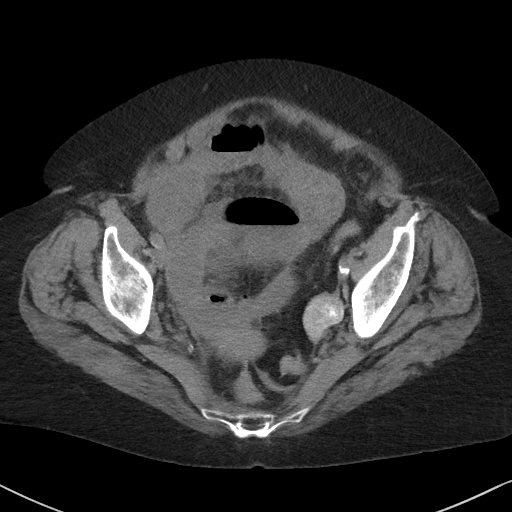
[im 35/94  soft-tissue]
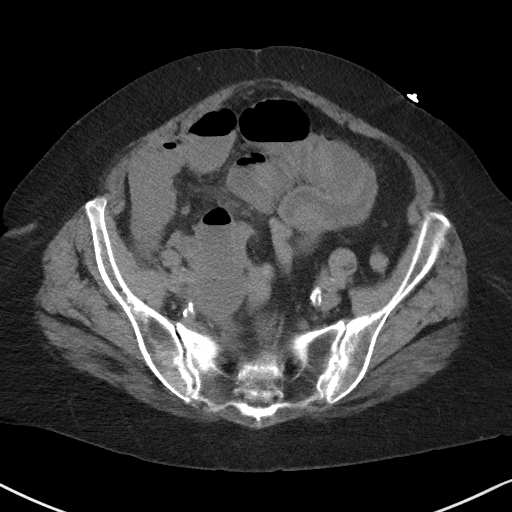
[im 43/94  soft-tissue]
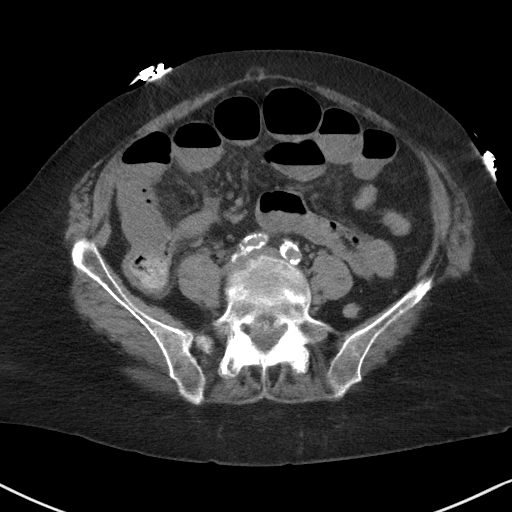
[im 51/94  soft-tissue]
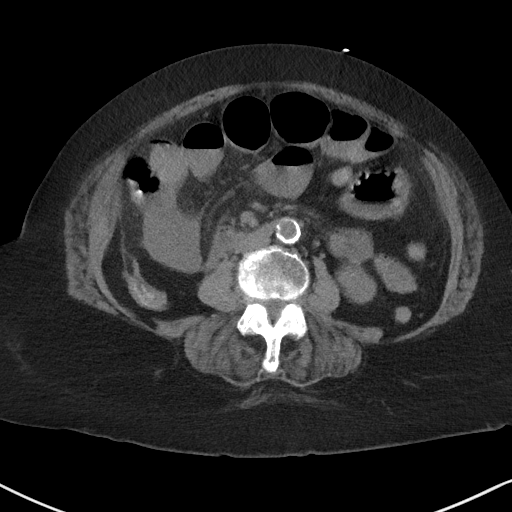
[im 59/94  soft-tissue]
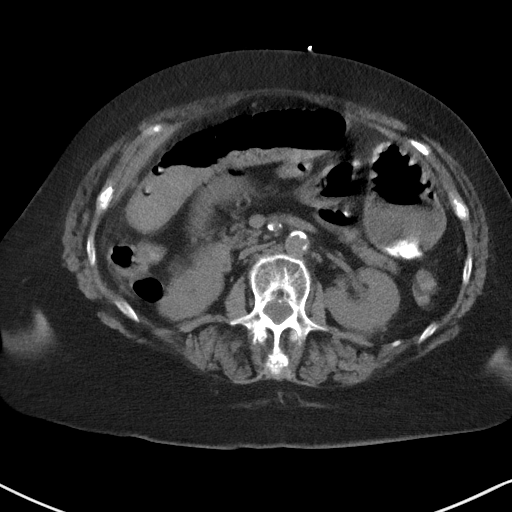
[im 66/94  soft-tissue]
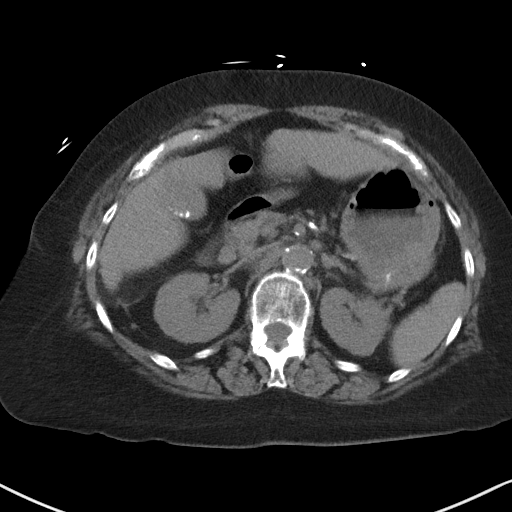
[im 66/94  bone]
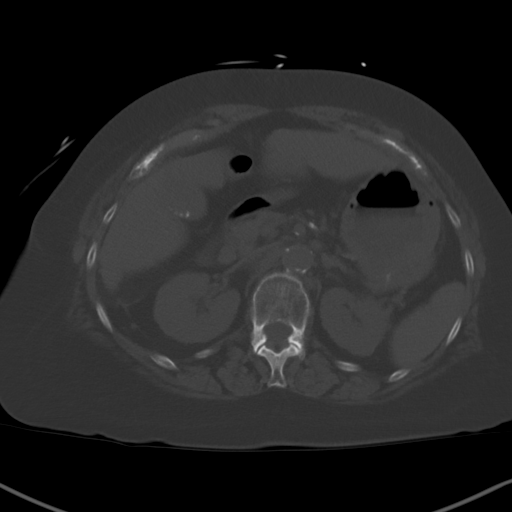
[im 74/94  soft-tissue]
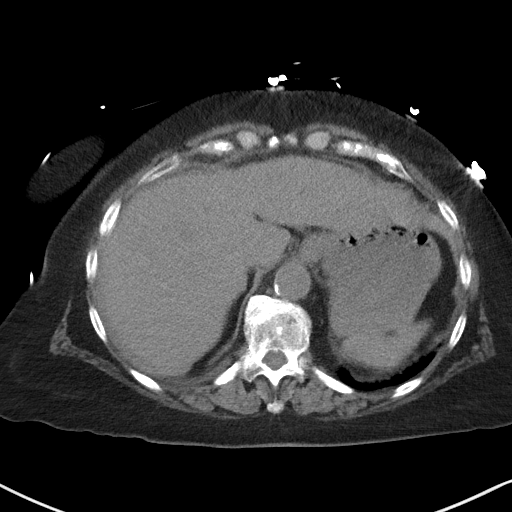
[im 82/94  soft-tissue]
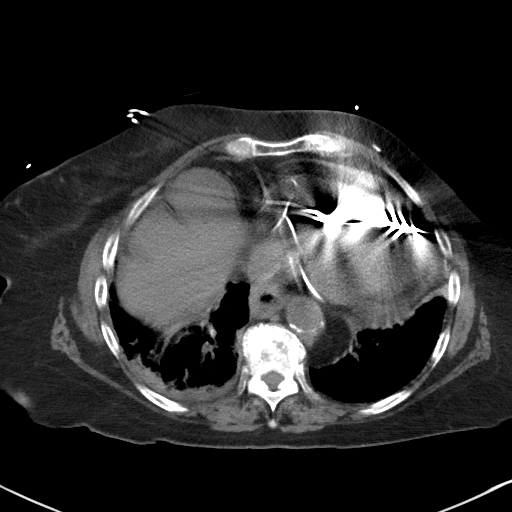
[im 90/94  soft-tissue]
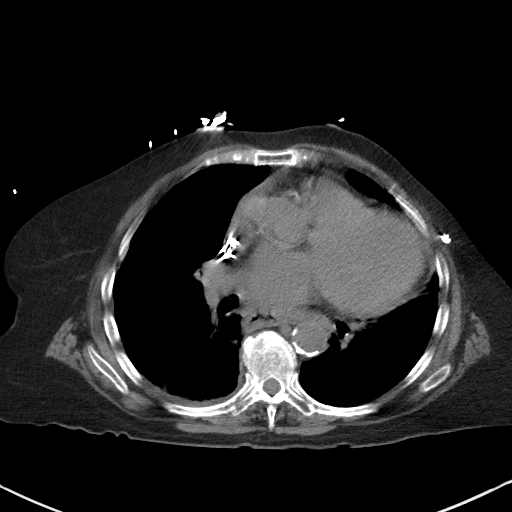

[Series 6: coronal st · coronal · 0.71mm/px · 3 of 92 slices shown]
[im 31/92  soft-tissue]
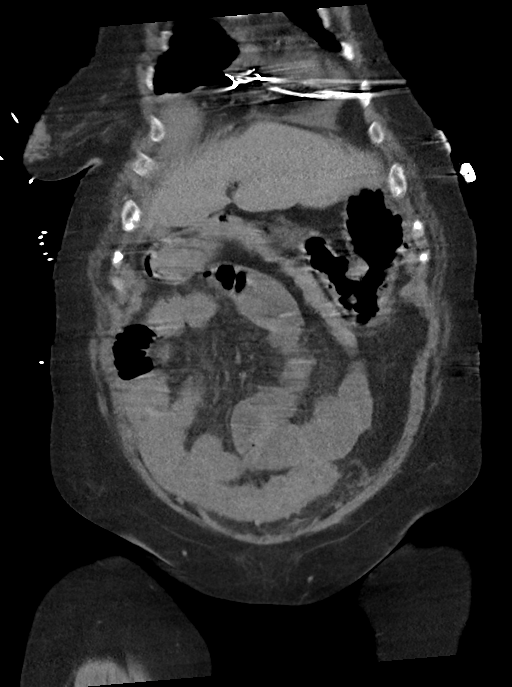
[im 41/92  soft-tissue]
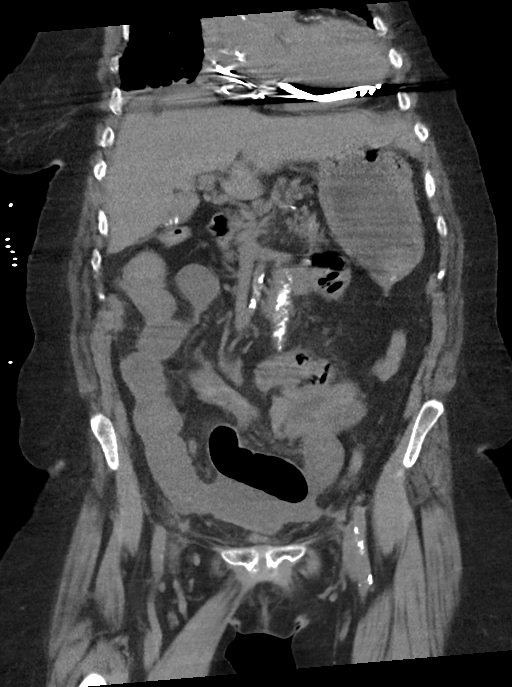
[im 51/92  soft-tissue]
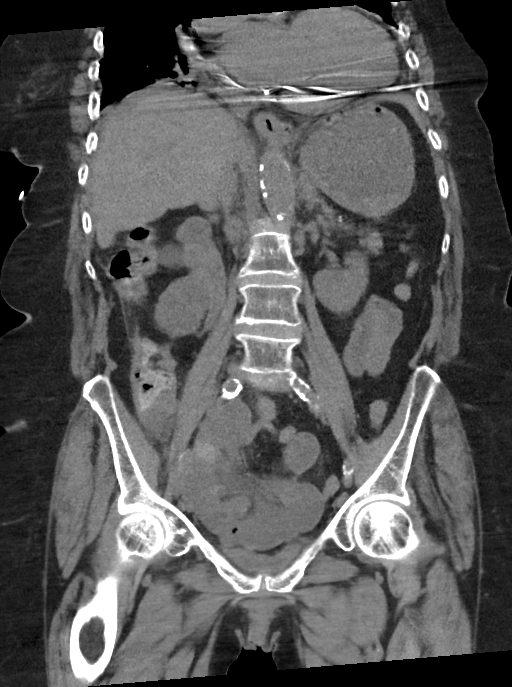

[15 of 46 positions shown; findings below may reference images not displayed]

FINDINGS: Lower chest: Lung bases demonstrate no pleural effusion. Atelectasis
or mild aspiration at the right base. Cardiomegaly with partially
visualized intracardiac pacing leads. Coronary vascular
calcification. Small moderate hiatal hernia.

Hepatobiliary: Multiple calcified gallstones. Mildly lobulated liver
margin. No focal hepatic abnormality or biliary dilatation.

Pancreas: Unremarkable. No pancreatic ductal dilatation or
surrounding inflammatory changes.

Spleen: Normal in size without focal abnormality.

Adrenals/Urinary Tract: Adrenal glands are within normal limits. No
hydronephrosis. Probable cyst in the mid to lower right kidney. The
bladder is unremarkable.

Stomach/Bowel: Fluid-filled stomach. Multiple loops of fluid-filled
dilated small bowel measuring up to 3.4 cm. Distal small bowel is
decompressed as is the colon. Poorly identified transition point due
to lack of enteral contrast but suspected to be within the distal
small bowel/pelvis. No intramural air. No definite bowel wall
thickening.

Vascular/Lymphatic: Extensive aortic atherosclerosis. No aneurysm.
No significant adenopathy

Reproductive: Uterus unremarkable. 3 x 2.4 cm hyperdense/partially
calcified left pelvic sidewall mass.

Other: No free air. Slightly dense fluid within the posterior
pelvis.

Musculoskeletal: Grade 1 anterolisthesis L4 on L5. Mild to moderate
superior endplate deformity at L4, new since 8104 comparison
radiographs.
IMPRESSION: 1. Multiple loops of dilated fluid-filled small bowel, consistent
with mechanical small bowel obstruction. Poorly identified
transition point due to lack of contrast, suspect that transition
point with in the distal ileum in the pelvis, given collapsed
appearance of the terminal ileum and colon. No free air at this
time.
2. Trace amount of slightly dense fluid within the pelvis, may
reflect a small amount of hemorrhagic ascites.
3. Partially calcified/hyperdense 3 cm left pelvic sidewall mass,
possibly a lymph node or a left adnexal mass.
4. Mild consolidation at the right lung base which may be secondary
to atelectasis, mild pneumonia or aspiration.
5. Cardiomegaly
6. Gallstones.  Slightly lobulated liver margin, question cirrhosis
